# Patient Record
Sex: Female | Born: 1945
Health system: Southern US, Community
[De-identification: ages and names within clinical notes are randomized; demographics above are authoritative.]

## PROBLEM LIST (undated history)

## (undated) DIAGNOSIS — I10 Essential (primary) hypertension: Secondary | ICD-10-CM

## (undated) DIAGNOSIS — K219 Gastro-esophageal reflux disease without esophagitis: Secondary | ICD-10-CM

## (undated) DIAGNOSIS — E785 Hyperlipidemia, unspecified: Secondary | ICD-10-CM

## (undated) DIAGNOSIS — J189 Pneumonia, unspecified organism: Secondary | ICD-10-CM

## (undated) DIAGNOSIS — M1990 Unspecified osteoarthritis, unspecified site: Secondary | ICD-10-CM

## (undated) DIAGNOSIS — E119 Type 2 diabetes mellitus without complications: Secondary | ICD-10-CM

## (undated) DIAGNOSIS — L411 Pityriasis lichenoides chronica: Secondary | ICD-10-CM

## (undated) DIAGNOSIS — I82409 Acute embolism and thrombosis of unspecified deep veins of unspecified lower extremity: Secondary | ICD-10-CM

## (undated) DIAGNOSIS — F419 Anxiety disorder, unspecified: Secondary | ICD-10-CM

## (undated) DIAGNOSIS — G629 Polyneuropathy, unspecified: Secondary | ICD-10-CM

## (undated) DIAGNOSIS — D649 Anemia, unspecified: Secondary | ICD-10-CM

## (undated) DIAGNOSIS — G4730 Sleep apnea, unspecified: Secondary | ICD-10-CM

## (undated) DIAGNOSIS — F32A Depression, unspecified: Secondary | ICD-10-CM

## (undated) DIAGNOSIS — K52832 Lymphocytic colitis: Secondary | ICD-10-CM

## (undated) HISTORY — PX: HAMMER TOE SURGERY: SHX385

## (undated) HISTORY — PX: OTHER SURGICAL HISTORY: SHX169

## (undated) HISTORY — DX: Hyperlipidemia, unspecified: E78.5

## (undated) HISTORY — PX: DILATION AND CURETTAGE OF UTERUS: SHX78

---

## 2007-08-11 DIAGNOSIS — K52832 Lymphocytic colitis: Secondary | ICD-10-CM | POA: Insufficient documentation

## 2009-08-23 ENCOUNTER — Encounter: Admission: RE | Admit: 2009-08-23 | Discharge: 2009-11-05 | Payer: Self-pay | Admitting: Orthopaedic Surgery

## 2009-08-27 ENCOUNTER — Encounter: Admission: RE | Admit: 2009-08-27 | Discharge: 2009-11-25 | Payer: Self-pay | Admitting: Orthopedic Surgery

## 2010-08-10 DIAGNOSIS — I82409 Acute embolism and thrombosis of unspecified deep veins of unspecified lower extremity: Secondary | ICD-10-CM | POA: Insufficient documentation

## 2010-09-18 ENCOUNTER — Ambulatory Visit: Payer: Medicare Other | Attending: Orthopedic Surgery | Admitting: Physical Therapy

## 2010-09-18 DIAGNOSIS — M25673 Stiffness of unspecified ankle, not elsewhere classified: Secondary | ICD-10-CM | POA: Insufficient documentation

## 2010-09-18 DIAGNOSIS — M25579 Pain in unspecified ankle and joints of unspecified foot: Secondary | ICD-10-CM | POA: Insufficient documentation

## 2010-09-18 DIAGNOSIS — IMO0001 Reserved for inherently not codable concepts without codable children: Secondary | ICD-10-CM | POA: Insufficient documentation

## 2010-09-18 DIAGNOSIS — M25676 Stiffness of unspecified foot, not elsewhere classified: Secondary | ICD-10-CM | POA: Insufficient documentation

## 2011-08-16 ENCOUNTER — Ambulatory Visit (INDEPENDENT_AMBULATORY_CARE_PROVIDER_SITE_OTHER): Payer: Medicare Other

## 2011-08-16 DIAGNOSIS — H66009 Acute suppurative otitis media without spontaneous rupture of ear drum, unspecified ear: Secondary | ICD-10-CM | POA: Diagnosis not present

## 2011-08-21 DIAGNOSIS — M545 Low back pain, unspecified: Secondary | ICD-10-CM | POA: Diagnosis not present

## 2011-08-21 DIAGNOSIS — L408 Other psoriasis: Secondary | ICD-10-CM | POA: Diagnosis not present

## 2011-08-21 DIAGNOSIS — K5289 Other specified noninfective gastroenteritis and colitis: Secondary | ICD-10-CM | POA: Diagnosis not present

## 2011-08-21 DIAGNOSIS — M1990 Unspecified osteoarthritis, unspecified site: Secondary | ICD-10-CM | POA: Diagnosis not present

## 2011-08-21 DIAGNOSIS — L4050 Arthropathic psoriasis, unspecified: Secondary | ICD-10-CM | POA: Diagnosis not present

## 2011-08-21 DIAGNOSIS — Z79899 Other long term (current) drug therapy: Secondary | ICD-10-CM | POA: Diagnosis not present

## 2011-08-21 DIAGNOSIS — M109 Gout, unspecified: Secondary | ICD-10-CM | POA: Diagnosis not present

## 2011-08-21 DIAGNOSIS — M2550 Pain in unspecified joint: Secondary | ICD-10-CM | POA: Diagnosis not present

## 2011-09-11 DIAGNOSIS — H60399 Other infective otitis externa, unspecified ear: Secondary | ICD-10-CM | POA: Diagnosis not present

## 2011-09-11 DIAGNOSIS — H6500 Acute serous otitis media, unspecified ear: Secondary | ICD-10-CM | POA: Diagnosis not present

## 2011-09-11 DIAGNOSIS — H902 Conductive hearing loss, unspecified: Secondary | ICD-10-CM | POA: Diagnosis not present

## 2011-10-05 DIAGNOSIS — K5289 Other specified noninfective gastroenteritis and colitis: Secondary | ICD-10-CM | POA: Diagnosis not present

## 2011-10-05 DIAGNOSIS — M19079 Primary osteoarthritis, unspecified ankle and foot: Secondary | ICD-10-CM | POA: Diagnosis not present

## 2011-10-05 DIAGNOSIS — S92309A Fracture of unspecified metatarsal bone(s), unspecified foot, initial encounter for closed fracture: Secondary | ICD-10-CM | POA: Diagnosis not present

## 2011-10-05 DIAGNOSIS — Z1159 Encounter for screening for other viral diseases: Secondary | ICD-10-CM | POA: Diagnosis not present

## 2011-10-08 DIAGNOSIS — K5289 Other specified noninfective gastroenteritis and colitis: Secondary | ICD-10-CM | POA: Diagnosis not present

## 2011-10-08 DIAGNOSIS — Z79899 Other long term (current) drug therapy: Secondary | ICD-10-CM | POA: Diagnosis not present

## 2011-10-08 DIAGNOSIS — L4050 Arthropathic psoriasis, unspecified: Secondary | ICD-10-CM | POA: Diagnosis not present

## 2011-10-08 DIAGNOSIS — L408 Other psoriasis: Secondary | ICD-10-CM | POA: Diagnosis not present

## 2011-10-08 DIAGNOSIS — M109 Gout, unspecified: Secondary | ICD-10-CM | POA: Diagnosis not present

## 2011-10-08 DIAGNOSIS — M1990 Unspecified osteoarthritis, unspecified site: Secondary | ICD-10-CM | POA: Diagnosis not present

## 2011-10-22 DIAGNOSIS — B351 Tinea unguium: Secondary | ICD-10-CM | POA: Diagnosis not present

## 2011-10-22 DIAGNOSIS — M79609 Pain in unspecified limb: Secondary | ICD-10-CM | POA: Diagnosis not present

## 2011-11-02 DIAGNOSIS — S92309A Fracture of unspecified metatarsal bone(s), unspecified foot, initial encounter for closed fracture: Secondary | ICD-10-CM | POA: Diagnosis not present

## 2011-11-05 DIAGNOSIS — Z79899 Other long term (current) drug therapy: Secondary | ICD-10-CM | POA: Diagnosis not present

## 2011-11-05 DIAGNOSIS — L4050 Arthropathic psoriasis, unspecified: Secondary | ICD-10-CM | POA: Diagnosis not present

## 2011-12-10 DIAGNOSIS — M109 Gout, unspecified: Secondary | ICD-10-CM | POA: Diagnosis not present

## 2011-12-10 DIAGNOSIS — M1990 Unspecified osteoarthritis, unspecified site: Secondary | ICD-10-CM | POA: Diagnosis not present

## 2011-12-10 DIAGNOSIS — L4050 Arthropathic psoriasis, unspecified: Secondary | ICD-10-CM | POA: Diagnosis not present

## 2011-12-10 DIAGNOSIS — M2550 Pain in unspecified joint: Secondary | ICD-10-CM | POA: Diagnosis not present

## 2011-12-10 DIAGNOSIS — Z79899 Other long term (current) drug therapy: Secondary | ICD-10-CM | POA: Diagnosis not present

## 2011-12-10 DIAGNOSIS — L408 Other psoriasis: Secondary | ICD-10-CM | POA: Diagnosis not present

## 2011-12-10 DIAGNOSIS — K5289 Other specified noninfective gastroenteritis and colitis: Secondary | ICD-10-CM | POA: Diagnosis not present

## 2011-12-31 DIAGNOSIS — B351 Tinea unguium: Secondary | ICD-10-CM | POA: Diagnosis not present

## 2011-12-31 DIAGNOSIS — M79609 Pain in unspecified limb: Secondary | ICD-10-CM | POA: Diagnosis not present

## 2012-01-07 DIAGNOSIS — H5210 Myopia, unspecified eye: Secondary | ICD-10-CM | POA: Diagnosis not present

## 2012-01-07 DIAGNOSIS — H25019 Cortical age-related cataract, unspecified eye: Secondary | ICD-10-CM | POA: Diagnosis not present

## 2012-01-07 DIAGNOSIS — E119 Type 2 diabetes mellitus without complications: Secondary | ICD-10-CM | POA: Diagnosis not present

## 2012-01-07 DIAGNOSIS — H524 Presbyopia: Secondary | ICD-10-CM | POA: Diagnosis not present

## 2012-01-28 DIAGNOSIS — E669 Obesity, unspecified: Secondary | ICD-10-CM | POA: Diagnosis not present

## 2012-01-28 DIAGNOSIS — G4733 Obstructive sleep apnea (adult) (pediatric): Secondary | ICD-10-CM | POA: Diagnosis not present

## 2012-01-28 DIAGNOSIS — I10 Essential (primary) hypertension: Secondary | ICD-10-CM | POA: Diagnosis not present

## 2012-02-12 DIAGNOSIS — E119 Type 2 diabetes mellitus without complications: Secondary | ICD-10-CM | POA: Diagnosis not present

## 2012-02-12 DIAGNOSIS — M109 Gout, unspecified: Secondary | ICD-10-CM | POA: Diagnosis not present

## 2012-02-12 DIAGNOSIS — E559 Vitamin D deficiency, unspecified: Secondary | ICD-10-CM | POA: Diagnosis not present

## 2012-02-12 DIAGNOSIS — E785 Hyperlipidemia, unspecified: Secondary | ICD-10-CM | POA: Diagnosis not present

## 2012-02-12 DIAGNOSIS — I10 Essential (primary) hypertension: Secondary | ICD-10-CM | POA: Diagnosis not present

## 2012-02-17 DIAGNOSIS — F411 Generalized anxiety disorder: Secondary | ICD-10-CM | POA: Diagnosis not present

## 2012-02-17 DIAGNOSIS — E785 Hyperlipidemia, unspecified: Secondary | ICD-10-CM | POA: Diagnosis not present

## 2012-02-17 DIAGNOSIS — H6120 Impacted cerumen, unspecified ear: Secondary | ICD-10-CM | POA: Diagnosis not present

## 2012-02-17 DIAGNOSIS — I10 Essential (primary) hypertension: Secondary | ICD-10-CM | POA: Diagnosis not present

## 2012-02-17 DIAGNOSIS — E119 Type 2 diabetes mellitus without complications: Secondary | ICD-10-CM | POA: Diagnosis not present

## 2012-02-22 DIAGNOSIS — M1710 Unilateral primary osteoarthritis, unspecified knee: Secondary | ICD-10-CM | POA: Diagnosis not present

## 2012-02-24 DIAGNOSIS — L719 Rosacea, unspecified: Secondary | ICD-10-CM | POA: Diagnosis not present

## 2012-02-24 DIAGNOSIS — L408 Other psoriasis: Secondary | ICD-10-CM | POA: Diagnosis not present

## 2012-03-07 DIAGNOSIS — H608X9 Other otitis externa, unspecified ear: Secondary | ICD-10-CM | POA: Diagnosis not present

## 2012-03-07 DIAGNOSIS — H6120 Impacted cerumen, unspecified ear: Secondary | ICD-10-CM | POA: Diagnosis not present

## 2012-04-06 DIAGNOSIS — M1610 Unilateral primary osteoarthritis, unspecified hip: Secondary | ICD-10-CM | POA: Diagnosis not present

## 2012-04-06 DIAGNOSIS — M1710 Unilateral primary osteoarthritis, unspecified knee: Secondary | ICD-10-CM | POA: Diagnosis not present

## 2012-04-06 DIAGNOSIS — IMO0002 Reserved for concepts with insufficient information to code with codable children: Secondary | ICD-10-CM | POA: Diagnosis not present

## 2012-04-06 DIAGNOSIS — M169 Osteoarthritis of hip, unspecified: Secondary | ICD-10-CM | POA: Diagnosis not present

## 2012-04-19 DIAGNOSIS — M169 Osteoarthritis of hip, unspecified: Secondary | ICD-10-CM | POA: Diagnosis not present

## 2012-04-19 DIAGNOSIS — M1610 Unilateral primary osteoarthritis, unspecified hip: Secondary | ICD-10-CM | POA: Diagnosis not present

## 2012-04-20 DIAGNOSIS — K5289 Other specified noninfective gastroenteritis and colitis: Secondary | ICD-10-CM | POA: Diagnosis not present

## 2012-04-20 DIAGNOSIS — L4050 Arthropathic psoriasis, unspecified: Secondary | ICD-10-CM | POA: Diagnosis not present

## 2012-04-20 DIAGNOSIS — M109 Gout, unspecified: Secondary | ICD-10-CM | POA: Diagnosis not present

## 2012-04-20 DIAGNOSIS — M2550 Pain in unspecified joint: Secondary | ICD-10-CM | POA: Diagnosis not present

## 2012-04-20 DIAGNOSIS — L408 Other psoriasis: Secondary | ICD-10-CM | POA: Diagnosis not present

## 2012-04-20 DIAGNOSIS — M1990 Unspecified osteoarthritis, unspecified site: Secondary | ICD-10-CM | POA: Diagnosis not present

## 2012-04-20 DIAGNOSIS — Z79899 Other long term (current) drug therapy: Secondary | ICD-10-CM | POA: Diagnosis not present

## 2012-05-03 DIAGNOSIS — I4510 Unspecified right bundle-branch block: Secondary | ICD-10-CM | POA: Diagnosis not present

## 2012-05-05 DIAGNOSIS — IMO0002 Reserved for concepts with insufficient information to code with codable children: Secondary | ICD-10-CM | POA: Diagnosis not present

## 2012-05-05 DIAGNOSIS — M1710 Unilateral primary osteoarthritis, unspecified knee: Secondary | ICD-10-CM | POA: Diagnosis not present

## 2012-05-11 DIAGNOSIS — I10 Essential (primary) hypertension: Secondary | ICD-10-CM | POA: Diagnosis not present

## 2012-05-11 DIAGNOSIS — I4510 Unspecified right bundle-branch block: Secondary | ICD-10-CM | POA: Diagnosis not present

## 2012-06-06 DIAGNOSIS — Z23 Encounter for immunization: Secondary | ICD-10-CM | POA: Diagnosis not present

## 2012-06-06 DIAGNOSIS — I82409 Acute embolism and thrombosis of unspecified deep veins of unspecified lower extremity: Secondary | ICD-10-CM | POA: Diagnosis not present

## 2012-06-07 DIAGNOSIS — Z7901 Long term (current) use of anticoagulants: Secondary | ICD-10-CM | POA: Diagnosis not present

## 2012-06-07 DIAGNOSIS — I82409 Acute embolism and thrombosis of unspecified deep veins of unspecified lower extremity: Secondary | ICD-10-CM | POA: Diagnosis not present

## 2012-06-07 DIAGNOSIS — I10 Essential (primary) hypertension: Secondary | ICD-10-CM | POA: Diagnosis not present

## 2012-06-08 DIAGNOSIS — I10 Essential (primary) hypertension: Secondary | ICD-10-CM | POA: Diagnosis not present

## 2012-06-08 DIAGNOSIS — I82409 Acute embolism and thrombosis of unspecified deep veins of unspecified lower extremity: Secondary | ICD-10-CM | POA: Diagnosis not present

## 2012-06-08 DIAGNOSIS — I059 Rheumatic mitral valve disease, unspecified: Secondary | ICD-10-CM | POA: Diagnosis not present

## 2012-06-21 DIAGNOSIS — I82409 Acute embolism and thrombosis of unspecified deep veins of unspecified lower extremity: Secondary | ICD-10-CM | POA: Diagnosis not present

## 2012-06-21 DIAGNOSIS — Z7901 Long term (current) use of anticoagulants: Secondary | ICD-10-CM | POA: Diagnosis not present

## 2012-06-21 DIAGNOSIS — I10 Essential (primary) hypertension: Secondary | ICD-10-CM | POA: Diagnosis not present

## 2012-06-21 DIAGNOSIS — L4050 Arthropathic psoriasis, unspecified: Secondary | ICD-10-CM | POA: Diagnosis not present

## 2012-07-21 DIAGNOSIS — M169 Osteoarthritis of hip, unspecified: Secondary | ICD-10-CM | POA: Diagnosis not present

## 2012-07-21 DIAGNOSIS — L408 Other psoriasis: Secondary | ICD-10-CM | POA: Diagnosis not present

## 2012-07-21 DIAGNOSIS — L821 Other seborrheic keratosis: Secondary | ICD-10-CM | POA: Diagnosis not present

## 2012-07-21 DIAGNOSIS — L210 Seborrhea capitis: Secondary | ICD-10-CM | POA: Diagnosis not present

## 2012-07-21 DIAGNOSIS — M1610 Unilateral primary osteoarthritis, unspecified hip: Secondary | ICD-10-CM | POA: Diagnosis not present

## 2012-07-21 DIAGNOSIS — L819 Disorder of pigmentation, unspecified: Secondary | ICD-10-CM | POA: Diagnosis not present

## 2012-07-21 DIAGNOSIS — Z01818 Encounter for other preprocedural examination: Secondary | ICD-10-CM | POA: Diagnosis not present

## 2012-07-21 DIAGNOSIS — D1801 Hemangioma of skin and subcutaneous tissue: Secondary | ICD-10-CM | POA: Diagnosis not present

## 2012-07-21 DIAGNOSIS — L708 Other acne: Secondary | ICD-10-CM | POA: Diagnosis not present

## 2012-07-21 DIAGNOSIS — D239 Other benign neoplasm of skin, unspecified: Secondary | ICD-10-CM | POA: Diagnosis not present

## 2012-07-25 DIAGNOSIS — M1990 Unspecified osteoarthritis, unspecified site: Secondary | ICD-10-CM | POA: Diagnosis not present

## 2012-07-25 DIAGNOSIS — M109 Gout, unspecified: Secondary | ICD-10-CM | POA: Diagnosis not present

## 2012-07-25 DIAGNOSIS — L4050 Arthropathic psoriasis, unspecified: Secondary | ICD-10-CM | POA: Diagnosis not present

## 2012-07-25 DIAGNOSIS — K5289 Other specified noninfective gastroenteritis and colitis: Secondary | ICD-10-CM | POA: Diagnosis not present

## 2012-07-25 DIAGNOSIS — I82409 Acute embolism and thrombosis of unspecified deep veins of unspecified lower extremity: Secondary | ICD-10-CM | POA: Diagnosis not present

## 2012-07-25 DIAGNOSIS — Z79899 Other long term (current) drug therapy: Secondary | ICD-10-CM | POA: Diagnosis not present

## 2012-07-25 DIAGNOSIS — M2550 Pain in unspecified joint: Secondary | ICD-10-CM | POA: Diagnosis not present

## 2012-07-25 DIAGNOSIS — L408 Other psoriasis: Secondary | ICD-10-CM | POA: Diagnosis not present

## 2012-08-08 DIAGNOSIS — I119 Hypertensive heart disease without heart failure: Secondary | ICD-10-CM | POA: Diagnosis not present

## 2012-08-08 DIAGNOSIS — I82409 Acute embolism and thrombosis of unspecified deep veins of unspecified lower extremity: Secondary | ICD-10-CM | POA: Diagnosis not present

## 2012-08-08 DIAGNOSIS — G4733 Obstructive sleep apnea (adult) (pediatric): Secondary | ICD-10-CM | POA: Diagnosis not present

## 2012-08-10 DIAGNOSIS — R7302 Impaired glucose tolerance (oral): Secondary | ICD-10-CM | POA: Insufficient documentation

## 2012-08-15 DIAGNOSIS — Z7901 Long term (current) use of anticoagulants: Secondary | ICD-10-CM | POA: Diagnosis not present

## 2012-08-15 DIAGNOSIS — E785 Hyperlipidemia, unspecified: Secondary | ICD-10-CM | POA: Diagnosis not present

## 2012-08-15 DIAGNOSIS — E559 Vitamin D deficiency, unspecified: Secondary | ICD-10-CM | POA: Diagnosis not present

## 2012-08-15 DIAGNOSIS — I10 Essential (primary) hypertension: Secondary | ICD-10-CM | POA: Diagnosis not present

## 2012-08-15 DIAGNOSIS — E119 Type 2 diabetes mellitus without complications: Secondary | ICD-10-CM | POA: Diagnosis not present

## 2012-08-15 DIAGNOSIS — I82409 Acute embolism and thrombosis of unspecified deep veins of unspecified lower extremity: Secondary | ICD-10-CM | POA: Diagnosis not present

## 2012-08-15 DIAGNOSIS — M109 Gout, unspecified: Secondary | ICD-10-CM | POA: Diagnosis not present

## 2012-08-22 DIAGNOSIS — E785 Hyperlipidemia, unspecified: Secondary | ICD-10-CM | POA: Diagnosis not present

## 2012-08-22 DIAGNOSIS — M109 Gout, unspecified: Secondary | ICD-10-CM | POA: Diagnosis not present

## 2012-08-22 DIAGNOSIS — E119 Type 2 diabetes mellitus without complications: Secondary | ICD-10-CM | POA: Diagnosis not present

## 2012-08-22 DIAGNOSIS — I10 Essential (primary) hypertension: Secondary | ICD-10-CM | POA: Diagnosis not present

## 2012-08-22 DIAGNOSIS — I82409 Acute embolism and thrombosis of unspecified deep veins of unspecified lower extremity: Secondary | ICD-10-CM | POA: Diagnosis not present

## 2012-09-05 DIAGNOSIS — M79609 Pain in unspecified limb: Secondary | ICD-10-CM | POA: Diagnosis not present

## 2012-09-05 DIAGNOSIS — B351 Tinea unguium: Secondary | ICD-10-CM | POA: Diagnosis not present

## 2012-09-05 NOTE — Patient Instructions (Addendum)
[name] [name]  09/05/2012   Your procedure is scheduled on:  09/13/12   Report to [facility] at 0800 AM.  Call this number if you have problems the morning of surgery: [phone]   Remember:   Do not eat food or drink liquids after midnight.   Take these medicines the morning of surgery with A SIP OF WATER:    Do not wear jewelry, make-up or nail polish.  Do not wear lotions, powders, or perfumes. .  Do not shave 48 hours prior to surgery.   Do not bring valuables to the hospital.  Contacts, dentures or bridgework may not be worn into surgery.  Leave suitcase in the car. After surgery it may be brought to your room.  For patients admitted to the hospital, checkout time is 11:00 AM the day of  discharge.              SEE CHG INSTRUCTION SHEET     Please read over the following fact sheets that you were given: MRSA Information, Blood Transfusion Fact Sheet, coughing and deep breathing exercises, leg exercises, Incentive Spirometry Fact sheet.               Failure to comply with these instructions may result in cancellation of your surgery.               Patient Signature ______________________________             Nurse Signature ______________________________

## 2012-09-06 ENCOUNTER — Ambulatory Visit (HOSPITAL_COMMUNITY)
Admission: RE | Admit: 2012-09-06 | Discharge: 2012-09-06 | Disposition: A | Discharge status: Home / Self Care | Payer: Medicare Other | Source: Ambulatory Visit | Attending: Orthopedic Surgery | Admitting: Orthopedic Surgery

## 2012-09-06 ENCOUNTER — Encounter (HOSPITAL_COMMUNITY): Payer: Self-pay

## 2012-09-06 ENCOUNTER — Encounter (HOSPITAL_COMMUNITY): Payer: Self-pay | Admitting: Pharmacy Technician

## 2012-09-06 ENCOUNTER — Encounter (HOSPITAL_COMMUNITY)
Admission: RE | Admit: 2012-09-06 | Discharge: 2012-09-06 | Disposition: A | Discharge status: Home / Self Care | Payer: Medicare Other | Source: Ambulatory Visit | Attending: Orthopedic Surgery | Admitting: Orthopedic Surgery

## 2012-09-06 DIAGNOSIS — Z01818 Encounter for other preprocedural examination: Secondary | ICD-10-CM | POA: Diagnosis not present

## 2012-09-06 DIAGNOSIS — Z01812 Encounter for preprocedural laboratory examination: Secondary | ICD-10-CM | POA: Insufficient documentation

## 2012-09-06 DIAGNOSIS — I10 Essential (primary) hypertension: Secondary | ICD-10-CM | POA: Diagnosis not present

## 2012-09-06 HISTORY — DX: Essential (primary) hypertension: I10

## 2012-09-06 HISTORY — DX: Type 2 diabetes mellitus without complications: E11.9

## 2012-09-06 HISTORY — DX: Acute embolism and thrombosis of unspecified deep veins of unspecified lower extremity: I82.409

## 2012-09-06 HISTORY — DX: Polyneuropathy, unspecified: G62.9

## 2012-09-06 HISTORY — DX: Gastro-esophageal reflux disease without esophagitis: K21.9

## 2012-09-06 HISTORY — DX: Lymphocytic colitis: K52.832

## 2012-09-06 HISTORY — DX: Pityriasis lichenoides chronica: L41.1

## 2012-09-06 HISTORY — DX: Sleep apnea, unspecified: G47.30

## 2012-09-06 HISTORY — DX: Unspecified osteoarthritis, unspecified site: M19.90

## 2012-09-06 LAB — URINALYSIS, ROUTINE W REFLEX MICROSCOPIC
Bilirubin Urine: NEGATIVE
Glucose, UA: NEGATIVE mg/dL
Hgb urine dipstick: NEGATIVE
Ketones, ur: NEGATIVE mg/dL
Nitrite: NEGATIVE
Protein, ur: NEGATIVE mg/dL
Specific Gravity, Urine: 1.011 (ref 1.005–1.030)
Urobilinogen, UA: 1 mg/dL (ref 0.0–1.0)
pH: 7.5 (ref 5.0–8.0)

## 2012-09-06 LAB — URINE MICROSCOPIC-ADD ON

## 2012-09-06 LAB — SURGICAL PCR SCREEN
MRSA, PCR: INVALID — AB
Staphylococcus aureus: INVALID — AB

## 2012-09-06 LAB — BASIC METABOLIC PANEL
BUN: 16 mg/dL (ref 6–23)
CO2: 28 mEq/L (ref 19–32)
Calcium: 9.8 mg/dL (ref 8.4–10.5)
Chloride: 97 mEq/L (ref 96–112)
Creatinine, Ser: 0.77 mg/dL (ref 0.50–1.10)
GFR calc Af Amer: >90 mL/min (ref >90)
GFR calc non Af Amer: 85 mL/min — ABNORMAL LOW (ref >90)
Glucose, Bld: 108 mg/dL — ABNORMAL HIGH (ref 70–99)
Potassium: 4.2 mEq/L (ref 3.5–5.1)
Sodium: 135 mEq/L (ref 135–145)

## 2012-09-06 LAB — APTT: aPTT: 43 seconds — ABNORMAL HIGH (ref 24–37)

## 2012-09-06 LAB — CBC
HCT: 41.5 % (ref 36.0–46.0)
Hemoglobin: 14 g/dL (ref 12.0–15.0)
MCH: 31.8 pg (ref 26.0–34.0)
MCHC: 33.7 g/dL (ref 30.0–36.0)
MCV: 94.3 fL (ref 78.0–100.0)
Platelets: 229 10*3/uL (ref 150–400)
RBC: 4.4 MIL/uL (ref 3.87–5.11)
RDW: 13.9 % (ref 11.5–15.5)
WBC: 7 10*3/uL (ref 4.0–10.5)

## 2012-09-06 LAB — PROTIME-INR
INR: 1.94 — ABNORMAL HIGH (ref 0.00–1.49)
Prothrombin Time: 21.4 seconds — ABNORMAL HIGH (ref 11.6–15.2)

## 2012-09-06 NOTE — Progress Notes (Signed)
EKG 07/21/12 on chart  EKG 06/08/12 on chart  Office vist with Dr Nicki Guadalajara 08/08/12 on chart  ECHO 05/11/12 on chart  Office visit note - PCp- 08/22/12 on chart

## 2012-09-06 NOTE — Progress Notes (Signed)
Left patient a message regarding pcr swab sent out and results will not be back until Friday 09/09/12.  i informed patient I would let her know on Friday am of pcr swab results.  Also informed patient that I had questions regarding medications and asked if she could please call back at [phone].

## 2012-09-06 NOTE — Progress Notes (Signed)
PT, PTT, and Urinalysis with micro results faxed via EPIC to Dr Charlann Boxer.

## 2012-09-07 LAB — URINE CULTURE: Colony Count: 35000

## 2012-09-07 NOTE — Progress Notes (Signed)
Patient was made aware to bring CPAP mask and tubing am of surgery.  Patient voiced understanding.  Patient has been given instructions for Xarelto from [facility] for surgery per patient.  Patient is aware nurse will call on Friday am with pcr nasal swab results.

## 2012-09-08 LAB — MRSA CULTURE

## 2012-09-08 NOTE — Progress Notes (Signed)
Left patient a message to let her know that nasal swab results were negative for both MRSA and staph.  Instructed patient to call back so that nurse is aware she received message.

## 2012-09-11 NOTE — H&P (Signed)
TOTAL HIP ADMISSION H&P  Patient is admitted for right total hip arthroplasty, anterior approach.  Subjective:  Chief Complaint:  Right hip OA / pain  HPI: [name] [name], 67 y.o. female, has a history of pain and functional disability in the right hip(s) due to arthritis and patient has failed non-surgical conservative treatments for greater than 12 weeks to include NSAID's and/or analgesics, corticosteriod injections and activity modification.  Onset of symptoms was gradual starting 3 years ago with gradually worsening course since that time.The patient noted no past surgery on the right hip(s).  Patient currently rates pain in the right hip at 7 out of 10 with activity. Patient has night pain, worsening of pain with activity and weight bearing, trendelenberg gait, pain that interfers with activities of daily living and pain with passive range of motion. Patient has evidence of periarticular osteophytes and joint space narrowing by imaging studies. This condition presents safety issues increasing the risk of falls.  There is no current active infection.  Risks, benefits and expectations were discussed with the patient. Patient understand the risks, benefits and expectations and wishes to proceed with surgery.   D/C Plans:   Home with HHPT  Post-op Meds: No Rx given  Tranexamic Acid:   Not to be given - Hx of blood clots  Decadron:    Not to be given - DM  FYI:   Already on Xarelto and will continue after surgery  Past Medical History  Diagnosis Date  . Hypertension   . Sleep apnea     bipap  . Diabetes mellitus without complication     diet controlled   . Deep vein thrombosis     right calf - 05/2012   . GERD (gastroesophageal reflux disease)   . Arthritis   . Neuropathy     diabetic - in bilateral feet   . Pityriasis lichenoides chronica   . Lymphocytic colitis     Past Surgical History  Procedure Date  . Dilation and curettage of uterus   . Torn meniscus repair      right  knee   . Hammer toe surgery   . Right hand surgery      due to blood infection      Allergies  Allergen Reactions  . Fish Allergy Other (See Comments)    Blue fish - hands and feet turn red, breathing decreases  . Sulfa Antibiotics Hives    History  Substance Use Topics  . Smoking status: Never Smoker   . Smokeless tobacco: Never Used  . Alcohol Use: Yes     Comment: occasional wine        Review of Systems  Constitutional: Negative.   HENT: Negative.   Eyes: Negative.   Respiratory: Negative.   Cardiovascular: Negative.   Gastrointestinal: Negative.   Genitourinary: Negative.   Musculoskeletal: Positive for joint pain.  Skin: Negative.   Neurological: Negative.   Endo/Heme/Allergies: Negative.   Psychiatric/Behavioral: Negative.     Objective:  Physical Exam  Constitutional: She is oriented to person, place, and time. She appears well-developed and well-nourished.  HENT:  Head: Normocephalic and atraumatic.  Mouth/Throat: Oropharynx is clear and moist.  Eyes: Pupils are equal, round, and reactive to light.  Neck: Neck supple. No JVD present. No tracheal deviation present. No thyromegaly present.  Cardiovascular: Normal rate, regular rhythm, normal heart sounds and intact distal pulses.   Respiratory: Effort normal and breath sounds normal. No stridor. No respiratory distress. She has no wheezes.  GI:  Soft. There is no tenderness. There is no guarding.  Lymphadenopathy:    She has no cervical adenopathy.  Neurological: She is alert and oriented to person, place, and time. A sensory deficit (in the feet (known neuropathy)) is present.  Skin: Skin is warm and dry.  Psychiatric: She has a normal mood and affect.    Imaging Review Plain radiographs demonstrate severe degenerative joint disease of the right hip(s). The bone quality appears to be good for age and reported activity level.  Assessment/Plan:  End stage arthritis, right hip(s)  The patient history,  physical examination, clinical judgement of the provider and imaging studies are consistent with end stage degenerative joint disease of the right hip(s) and total hip arthroplasty is deemed medically necessary. The treatment options including medical management, injection therapy, arthroscopy and arthroplasty were discussed at length. The risks and benefits of total hip arthroplasty were presented and reviewed. The risks due to aseptic loosening, infection, stiffness, dislocation/subluxation,  thromboembolic complications and other imponderables were discussed.  The patient acknowledged the explanation, agreed to proceed with the plan and consent was signed. Patient is being admitted for inpatient treatment for surgery, pain control, PT, OT, prophylactic antibiotics, VTE prophylaxis, progressive ambulation and ADL's and discharge planning.The patient is planning to be discharged home with home health services.    Anastasio Auerbach [name] [name]   PAC  09/11/2012, 7:56 PM

## 2012-09-13 ENCOUNTER — Inpatient Hospital Stay (HOSPITAL_COMMUNITY): Payer: Medicare Other

## 2012-09-13 ENCOUNTER — Inpatient Hospital Stay (HOSPITAL_COMMUNITY): Payer: Medicare Other | Admitting: Anesthesiology

## 2012-09-13 ENCOUNTER — Encounter (HOSPITAL_COMMUNITY)
Admission: RE | Disposition: A | Discharge status: Home Health | Payer: Self-pay | Source: Ambulatory Visit | Attending: Orthopedic Surgery

## 2012-09-13 ENCOUNTER — Encounter (HOSPITAL_COMMUNITY): Payer: Self-pay | Admitting: *Deleted

## 2012-09-13 ENCOUNTER — Encounter (HOSPITAL_COMMUNITY): Payer: Self-pay | Admitting: Anesthesiology

## 2012-09-13 ENCOUNTER — Inpatient Hospital Stay (HOSPITAL_COMMUNITY)
Admission: RE | Admit: 2012-09-13 | Discharge: 2012-09-14 | DRG: 470 | Disposition: A | Discharge status: Home Health | Payer: Medicare Other | Source: Ambulatory Visit | Attending: Orthopedic Surgery | Admitting: Orthopedic Surgery

## 2012-09-13 DIAGNOSIS — Z86718 Personal history of other venous thrombosis and embolism: Secondary | ICD-10-CM

## 2012-09-13 DIAGNOSIS — E871 Hypo-osmolality and hyponatremia: Secondary | ICD-10-CM | POA: Diagnosis not present

## 2012-09-13 DIAGNOSIS — D500 Iron deficiency anemia secondary to blood loss (chronic): Secondary | ICD-10-CM | POA: Diagnosis not present

## 2012-09-13 DIAGNOSIS — E1142 Type 2 diabetes mellitus with diabetic polyneuropathy: Secondary | ICD-10-CM | POA: Diagnosis present

## 2012-09-13 DIAGNOSIS — M1610 Unilateral primary osteoarthritis, unspecified hip: Secondary | ICD-10-CM | POA: Diagnosis not present

## 2012-09-13 DIAGNOSIS — D62 Acute posthemorrhagic anemia: Secondary | ICD-10-CM | POA: Diagnosis not present

## 2012-09-13 DIAGNOSIS — I10 Essential (primary) hypertension: Secondary | ICD-10-CM | POA: Diagnosis not present

## 2012-09-13 DIAGNOSIS — Z471 Aftercare following joint replacement surgery: Secondary | ICD-10-CM | POA: Diagnosis not present

## 2012-09-13 DIAGNOSIS — Z96649 Presence of unspecified artificial hip joint: Secondary | ICD-10-CM

## 2012-09-13 DIAGNOSIS — M169 Osteoarthritis of hip, unspecified: Secondary | ICD-10-CM | POA: Diagnosis not present

## 2012-09-13 DIAGNOSIS — Z6838 Body mass index (BMI) 38.0-38.9, adult: Secondary | ICD-10-CM

## 2012-09-13 DIAGNOSIS — E1149 Type 2 diabetes mellitus with other diabetic neurological complication: Secondary | ICD-10-CM | POA: Diagnosis present

## 2012-09-13 DIAGNOSIS — G4730 Sleep apnea, unspecified: Secondary | ICD-10-CM | POA: Diagnosis present

## 2012-09-13 DIAGNOSIS — E669 Obesity, unspecified: Secondary | ICD-10-CM | POA: Diagnosis present

## 2012-09-13 DIAGNOSIS — L419 Parapsoriasis, unspecified: Secondary | ICD-10-CM | POA: Diagnosis present

## 2012-09-13 DIAGNOSIS — G4710 Hypersomnia, unspecified: Secondary | ICD-10-CM | POA: Diagnosis not present

## 2012-09-13 DIAGNOSIS — K219 Gastro-esophageal reflux disease without esophagitis: Secondary | ICD-10-CM | POA: Diagnosis not present

## 2012-09-13 HISTORY — PX: TOTAL HIP ARTHROPLASTY: SHX124

## 2012-09-13 LAB — GLUCOSE, CAPILLARY
Glucose-Capillary: 110 mg/dL — ABNORMAL HIGH (ref 70–99)
Glucose-Capillary: 136 mg/dL — ABNORMAL HIGH (ref 70–99)

## 2012-09-13 LAB — TYPE AND SCREEN
ABO/RH(D): O POS
Antibody Screen: NEGATIVE

## 2012-09-13 LAB — PROTIME-INR
INR: 1.14 (ref 0.00–1.49)
Prothrombin Time: 14.4 seconds (ref 11.6–15.2)

## 2012-09-13 LAB — ABO/RH: ABO/RH(D): O POS

## 2012-09-13 SURGERY — ARTHROPLASTY, HIP, TOTAL, ANTERIOR APPROACH
Anesthesia: General | Site: Hip | Laterality: Right | Wound class: Clean

## 2012-09-13 MED ORDER — LACTATED RINGERS IV SOLN: INTRAVENOUS | Status: DC

## 2012-09-13 MED ORDER — POLYETHYLENE GLYCOL 3350 17 G PO PACK: 17.0000 g | PACK | Freq: Every day | ORAL | Status: DC | PRN

## 2012-09-13 MED ORDER — METHOCARBAMOL 500 MG PO TABS
500.0000 mg | ORAL_TABLET | Freq: Four times a day (QID) | ORAL | Status: DC | PRN
  Administered 2012-09-13 – 2012-09-14 (×2): 500 mg via ORAL
  Filled 2012-09-13 (×2): qty 1

## 2012-09-13 MED ORDER — SENNA 8.6 MG PO TABS
1.0000 | ORAL_TABLET | Freq: Two times a day (BID) | ORAL | Status: DC
  Administered 2012-09-13 – 2012-09-14 (×2): 8.6 mg via ORAL
  Filled 2012-09-13 (×2): qty 1

## 2012-09-13 MED ORDER — ONDANSETRON HCL 4 MG PO TABS: 4.0000 mg | ORAL_TABLET | Freq: Four times a day (QID) | ORAL | Status: DC | PRN

## 2012-09-13 MED ORDER — DIPHENHYDRAMINE HCL 12.5 MG/5ML PO ELIX: 25.0000 mg | ORAL_SOLUTION | Freq: Four times a day (QID) | ORAL | Status: DC | PRN

## 2012-09-13 MED ORDER — GLYCOPYRROLATE 0.2 MG/ML IJ SOLN
INTRAMUSCULAR | Status: DC | PRN
  Administered 2012-09-13: .8 mg via INTRAVENOUS

## 2012-09-13 MED ORDER — LACTATED RINGERS IV SOLN
INTRAVENOUS | Status: DC
  Administered 2012-09-13 (×2): via INTRAVENOUS
  Administered 2012-09-13: 1000 mL via INTRAVENOUS

## 2012-09-13 MED ORDER — HYDROCODONE-ACETAMINOPHEN 7.5-325 MG PO TABS
1.0000 | ORAL_TABLET | ORAL | Status: DC
  Administered 2012-09-13 (×2): 2 via ORAL
  Administered 2012-09-13 (×2): 1 via ORAL
  Administered 2012-09-14 (×3): 2 via ORAL
  Filled 2012-09-13 (×4): qty 2
  Filled 2012-09-13: qty 1
  Filled 2012-09-13 (×2): qty 2

## 2012-09-13 MED ORDER — SODIUM CHLORIDE 0.9 % IV SOLN
INTRAVENOUS | Status: DC
  Administered 2012-09-13 – 2012-09-14 (×2): via INTRAVENOUS
  Filled 2012-09-13 (×5): qty 1000

## 2012-09-13 MED ORDER — LISINOPRIL 20 MG PO TABS
20.0000 mg | ORAL_TABLET | Freq: Every day | ORAL | Status: DC
  Administered 2012-09-14: 20 mg via ORAL
  Filled 2012-09-13 (×2): qty 1

## 2012-09-13 MED ORDER — STERILE WATER FOR IRRIGATION IR SOLN
Status: DC | PRN
  Administered 2012-09-13: 3000 mL

## 2012-09-13 MED ORDER — ACETAMINOPHEN 10 MG/ML IV SOLN
1000.0000 mg | Freq: Once | INTRAVENOUS | Status: AC
  Administered 2012-09-13: 1000 mg via INTRAVENOUS

## 2012-09-13 MED ORDER — SUCCINYLCHOLINE CHLORIDE 20 MG/ML IJ SOLN
INTRAMUSCULAR | Status: DC | PRN
  Administered 2012-09-13: 100 mg via INTRAVENOUS

## 2012-09-13 MED ORDER — ALLOPURINOL 100 MG PO TABS
100.0000 mg | ORAL_TABLET | Freq: Two times a day (BID) | ORAL | Status: DC
  Administered 2012-09-13 – 2012-09-14 (×2): 100 mg via ORAL
  Filled 2012-09-13 (×3): qty 1

## 2012-09-13 MED ORDER — LISINOPRIL-HYDROCHLOROTHIAZIDE 20-12.5 MG PO TABS: 1.0000 | ORAL_TABLET | Freq: Every morning | ORAL | Status: DC

## 2012-09-13 MED ORDER — ALUM & MAG HYDROXIDE-SIMETH 200-200-20 MG/5ML PO SUSP: 30.0000 mL | ORAL | Status: DC | PRN

## 2012-09-13 MED ORDER — 0.9 % SODIUM CHLORIDE (POUR BTL) OPTIME
TOPICAL | Status: DC | PRN
  Administered 2012-09-13: 1000 mL

## 2012-09-13 MED ORDER — MIDAZOLAM HCL 5 MG/5ML IJ SOLN
INTRAMUSCULAR | Status: DC | PRN
  Administered 2012-09-13: 2 mg via INTRAVENOUS

## 2012-09-13 MED ORDER — HYDROCHLOROTHIAZIDE 12.5 MG PO CAPS
12.5000 mg | ORAL_CAPSULE | Freq: Every day | ORAL | Status: DC
  Administered 2012-09-14: 12.5 mg via ORAL
  Filled 2012-09-13 (×2): qty 1

## 2012-09-13 MED ORDER — MENTHOL 3 MG MT LOZG
1.0000 | LOZENGE | OROMUCOSAL | Status: DC | PRN
  Filled 2012-09-13: qty 9

## 2012-09-13 MED ORDER — ONDANSETRON HCL 4 MG/2ML IJ SOLN: 4.0000 mg | Freq: Four times a day (QID) | INTRAMUSCULAR | Status: DC | PRN

## 2012-09-13 MED ORDER — VITAMIN D 1000 UNITS PO TABS
1000.0000 [IU] | ORAL_TABLET | Freq: Every morning | ORAL | Status: DC
  Administered 2012-09-14: 1000 [IU] via ORAL
  Filled 2012-09-13: qty 1

## 2012-09-13 MED ORDER — HYDROMORPHONE HCL PF 1 MG/ML IJ SOLN
0.2500 mg | INTRAMUSCULAR | Status: DC | PRN
  Administered 2012-09-13 (×3): 0.5 mg via INTRAVENOUS
  Administered 2012-09-13 (×2): 0.25 mg via INTRAVENOUS

## 2012-09-13 MED ORDER — LIDOCAINE HCL (CARDIAC) 20 MG/ML IV SOLN
INTRAVENOUS | Status: DC | PRN
  Administered 2012-09-13: 100 mg via INTRAVENOUS

## 2012-09-13 MED ORDER — KETOROLAC TROMETHAMINE 30 MG/ML IJ SOLN
15.0000 mg | Freq: Once | INTRAMUSCULAR | Status: AC | PRN
  Administered 2012-09-13: 30 mg via INTRAVENOUS

## 2012-09-13 MED ORDER — PHENOL 1.4 % MT LIQD
1.0000 | OROMUCOSAL | Status: DC | PRN
  Filled 2012-09-13: qty 177

## 2012-09-13 MED ORDER — HYDROMORPHONE HCL PF 1 MG/ML IJ SOLN: 0.2000 mg | INTRAMUSCULAR | Status: DC | PRN

## 2012-09-13 MED ORDER — CEFAZOLIN SODIUM-DEXTROSE 2-3 GM-% IV SOLR
2.0000 g | INTRAVENOUS | Status: AC
  Administered 2012-09-13: 2 g via INTRAVENOUS

## 2012-09-13 MED ORDER — CITALOPRAM HYDROBROMIDE 20 MG PO TABS
20.0000 mg | ORAL_TABLET | Freq: Every day | ORAL | Status: DC
  Administered 2012-09-13: 20 mg via ORAL
  Filled 2012-09-13 (×2): qty 1

## 2012-09-13 MED ORDER — DEXAMETHASONE SODIUM PHOSPHATE 10 MG/ML IJ SOLN
INTRAMUSCULAR | Status: DC | PRN
  Administered 2012-09-13: 10 mg via INTRAVENOUS

## 2012-09-13 MED ORDER — ONDANSETRON HCL 4 MG/2ML IJ SOLN
INTRAMUSCULAR | Status: DC | PRN
  Administered 2012-09-13: 4 mg via INTRAVENOUS

## 2012-09-13 MED ORDER — CEFAZOLIN SODIUM-DEXTROSE 2-3 GM-% IV SOLR
2.0000 g | Freq: Four times a day (QID) | INTRAVENOUS | Status: AC
  Administered 2012-09-13 (×2): 2 g via INTRAVENOUS
  Filled 2012-09-13 (×2): qty 50

## 2012-09-13 MED ORDER — MOMETASONE FUROATE 0.1 % EX CREA
1.0000 "application " | TOPICAL_CREAM | Freq: Every day | CUTANEOUS | Status: DC | PRN
  Filled 2012-09-13: qty 15

## 2012-09-13 MED ORDER — GABAPENTIN 300 MG PO CAPS
600.0000 mg | ORAL_CAPSULE | Freq: Three times a day (TID) | ORAL | Status: DC
  Administered 2012-09-13 – 2012-09-14 (×3): 600 mg via ORAL
  Filled 2012-09-13 (×5): qty 2

## 2012-09-13 MED ORDER — RIVAROXABAN 20 MG PO TABS
20.0000 mg | ORAL_TABLET | Freq: Every day | ORAL | Status: DC
Start: 2012-09-14 — End: 2012-09-14
  Administered 2012-09-14: 20 mg via ORAL
  Filled 2012-09-13: qty 1

## 2012-09-13 MED ORDER — PROPOFOL 10 MG/ML IV BOLUS
INTRAVENOUS | Status: DC | PRN
  Administered 2012-09-13: 170 mg via INTRAVENOUS

## 2012-09-13 MED ORDER — DOCUSATE SODIUM 100 MG PO CAPS
100.0000 mg | ORAL_CAPSULE | Freq: Two times a day (BID) | ORAL | Status: DC
  Administered 2012-09-13 – 2012-09-14 (×2): 100 mg via ORAL

## 2012-09-13 MED ORDER — CHLORHEXIDINE GLUCONATE 4 % EX LIQD
60.0000 mL | Freq: Once | CUTANEOUS | Status: DC
  Filled 2012-09-13: qty 60

## 2012-09-13 MED ORDER — PANTOPRAZOLE SODIUM 40 MG PO TBEC
80.0000 mg | DELAYED_RELEASE_TABLET | Freq: Every day | ORAL | Status: DC
  Administered 2012-09-13 – 2012-09-14 (×2): 80 mg via ORAL
  Filled 2012-09-13 (×2): qty 2

## 2012-09-13 MED ORDER — ZOLPIDEM TARTRATE 5 MG PO TABS: 5.0000 mg | ORAL_TABLET | Freq: Every evening | ORAL | Status: DC | PRN

## 2012-09-13 MED ORDER — NEOSTIGMINE METHYLSULFATE 1 MG/ML IJ SOLN
INTRAMUSCULAR | Status: DC | PRN
  Administered 2012-09-13: 4 mg via INTRAVENOUS

## 2012-09-13 MED ORDER — LOPERAMIDE HCL 2 MG PO CAPS
6.0000 mg | ORAL_CAPSULE | Freq: Two times a day (BID) | ORAL | Status: DC
  Administered 2012-09-13 – 2012-09-14 (×2): 6 mg via ORAL
  Filled 2012-09-13 (×3): qty 3

## 2012-09-13 MED ORDER — METHOCARBAMOL 100 MG/ML IJ SOLN
500.0000 mg | Freq: Four times a day (QID) | INTRAMUSCULAR | Status: DC | PRN
  Administered 2012-09-13: 500 mg via INTRAVENOUS
  Filled 2012-09-13: qty 5

## 2012-09-13 MED ORDER — FENTANYL CITRATE 0.05 MG/ML IJ SOLN
INTRAMUSCULAR | Status: DC | PRN
  Administered 2012-09-13 (×5): 50 ug via INTRAVENOUS

## 2012-09-13 MED ORDER — ROCURONIUM BROMIDE 100 MG/10ML IV SOLN
INTRAVENOUS | Status: DC | PRN
  Administered 2012-09-13: 40 mg via INTRAVENOUS
  Administered 2012-09-13: 5 mg via INTRAVENOUS

## 2012-09-13 MED ORDER — HYDROMORPHONE HCL PF 1 MG/ML IJ SOLN: 0.2500 mg | INTRAMUSCULAR | Status: DC | PRN

## 2012-09-13 MED ORDER — FERROUS SULFATE 325 (65 FE) MG PO TABS
325.0000 mg | ORAL_TABLET | Freq: Three times a day (TID) | ORAL | Status: DC
  Administered 2012-09-14 (×2): 325 mg via ORAL
  Filled 2012-09-13 (×5): qty 1

## 2012-09-13 SURGICAL SUPPLY — 41 items
BAG URINE DRAINAGE (UROLOGICAL SUPPLIES) ×2 IMPLANT
BAG ZIPLOCK 12X15 (MISCELLANEOUS) ×4 IMPLANT
BLADE SAW SGTL 18X1.27X75 (BLADE) ×2 IMPLANT
CELLS DAT CNTRL 66122 CELL SVR (MISCELLANEOUS) ×1 IMPLANT
CLOTH BEACON ORANGE TIMEOUT ST (SAFETY) ×2 IMPLANT
DERMABOND ADVANCED (GAUZE/BANDAGES/DRESSINGS) ×1
DERMABOND ADVANCED .7 DNX12 (GAUZE/BANDAGES/DRESSINGS) ×1 IMPLANT
DRAPE C-ARM 42X72 X-RAY (DRAPES) ×2 IMPLANT
DRAPE STERI IOBAN 125X83 (DRAPES) ×2 IMPLANT
DRAPE U-SHAPE 47X51 STRL (DRAPES) ×6 IMPLANT
DRSG AQUACEL AG ADV 3.5X10 (GAUZE/BANDAGES/DRESSINGS) ×2 IMPLANT
DRSG TEGADERM 4X4.75 (GAUZE/BANDAGES/DRESSINGS) ×2 IMPLANT
DURAPREP 26ML APPLICATOR (WOUND CARE) ×2 IMPLANT
ELECT BLADE TIP CTD 4 INCH (ELECTRODE) ×2 IMPLANT
ELECT REM PT RETURN 9FT ADLT (ELECTROSURGICAL) ×2
ELECTRODE REM PT RTRN 9FT ADLT (ELECTROSURGICAL) ×1 IMPLANT
EVACUATOR 1/8 PVC DRAIN (DRAIN) ×2 IMPLANT
FACESHIELD LNG OPTICON STERILE (SAFETY) ×8 IMPLANT
GAUZE SPONGE 2X2 8PLY STRL LF (GAUZE/BANDAGES/DRESSINGS) ×1 IMPLANT
GLOVE BIOGEL PI IND STRL 7.5 (GLOVE) ×1 IMPLANT
GLOVE BIOGEL PI IND STRL 8 (GLOVE) ×1 IMPLANT
GLOVE BIOGEL PI INDICATOR 7.5 (GLOVE) ×1
GLOVE BIOGEL PI INDICATOR 8 (GLOVE) ×1
GLOVE ECLIPSE 7.5 STRL STRAW (GLOVE) ×4 IMPLANT
GLOVE ECLIPSE 8.0 STRL XLNG CF (GLOVE) ×2 IMPLANT
GLOVE ORTHO TXT STRL SZ7.5 (GLOVE) ×4 IMPLANT
GOWN BRE IMP PREV XXLGXLNG (GOWN DISPOSABLE) ×4 IMPLANT
GOWN STRL NON-REIN LRG LVL3 (GOWN DISPOSABLE) ×2 IMPLANT
KIT BASIN OR (CUSTOM PROCEDURE TRAY) ×2 IMPLANT
PACK TOTAL JOINT (CUSTOM PROCEDURE TRAY) ×2 IMPLANT
PADDING CAST COTTON 6X4 STRL (CAST SUPPLIES) ×2 IMPLANT
RTRCTR WOUND ALEXIS 18CM MED (MISCELLANEOUS) ×2
SPONGE GAUZE 2X2 STER 10/PKG (GAUZE/BANDAGES/DRESSINGS) ×1
SUCTION FRAZIER 12FR DISP (SUCTIONS) IMPLANT
SUT MNCRL AB 4-0 PS2 18 (SUTURE) ×2 IMPLANT
SUT VIC AB 1 CT1 36 (SUTURE) ×6 IMPLANT
SUT VIC AB 2-0 CT1 27 (SUTURE) ×2
SUT VIC AB 2-0 CT1 TAPERPNT 27 (SUTURE) ×2 IMPLANT
SUT VLOC 180 0 24IN GS25 (SUTURE) ×2 IMPLANT
TOWEL OR 17X26 10 PK STRL BLUE (TOWEL DISPOSABLE) ×4 IMPLANT
TRAY FOLEY CATH 14FRSI W/METER (CATHETERS) ×2 IMPLANT

## 2012-09-13 NOTE — Transfer of Care (Signed)
Immediate Anesthesia Transfer of Care Note  Patient: [name] [name]  Procedure(s) Performed: Procedure(s) (LRB) with comments: TOTAL HIP ARTHROPLASTY ANTERIOR APPROACH (Right)  Patient Location: PACU  Anesthesia Type:General  Level of Consciousness: sedated  Airway & Oxygen Therapy: Patient Spontanous Breathing and Patient connected to face mask oxygen  Post-op Assessment: Report given to PACU RN and Post -op Vital signs reviewed and stable  Post vital signs: Reviewed and stable  Complications: No apparent anesthesia complications

## 2012-09-13 NOTE — Progress Notes (Signed)
Utilization review completed.  

## 2012-09-13 NOTE — Care Management Note (Addendum)
    Page 1 of 1   09/14/2012     3:37:30 PM   CARE MANAGEMENT NOTE 09/14/2012  Patient:  [name] [name], [name] [name]   Account Number:  [id number]  Date Initiated:  09/13/2012  Documentation initiated by:  Colleen Can  Subjective/Objective Assessment:   dx rt hip osteoarthritis; total hip replacemnt     Action/Plan:   CM spoke with patient and spouse. Plans are for patient to return to her home in [place] where spouse will be caregiver. She already has RW  PT is pending. CM will follow   Anticipated DC Date:  09/15/2012   Anticipated DC Plan:  HOME W HOME HEALTH SERVICES      DC Planning Services  CM consult      [facility] Choice  HOME HEALTH   Choice offered to / List presented to:  C-3 Spouse        HH arranged  HH-2 PT      [facility] agency  [facility]   Status of service:  Completed, signed off Medicare Important Message given?  NA - LOS <3 / Initial given by admissions (If response is "NO", the following Medicare IM given date fields will be blank) Date Medicare IM given:   Date Additional Medicare IM given:    Discharge Disposition:  HOME W HOME HEALTH SERVICES  Per UR Regulation:    If discussed at Long Length of Stay Meetings, dates discussed:    Comments:  09/14/2012 Genevieve Norlander will provide [facility] services with start date 09/15/2012/lmanningBSN RN CCM [phone]

## 2012-09-13 NOTE — Evaluation (Signed)
Physical Therapy Evaluation Patient Details Name: [name] [name] MRN: [id number] DOB: July 30, 1946 Today's Date: 09/13/2012 Time: 4132-4401 PT Time Calculation (min): 28 min  PT Assessment / Plan / Recommendation Clinical Impression  Pt. is 67 yo female admitted 09/13/12 for direct anterior THA. Pt. tolerated ambulation in hall today with mild dizziness and some R hip pain. Pt. will benefit from PT to improve in functional mobility to DC to home tomorrow.     PT Assessment  Patient needs continued PT services    Follow Up Recommendations  Home health PT;Supervision/Assistance - 24 hour    Does the patient have the potential to tolerate intense rehabilitation      Barriers to Discharge        Equipment Recommendations       Recommendations for Other Services     Frequency 7X/week    Precautions / Restrictions Precautions Precaution Comments: peripheral neuropathy Restrictions Weight Bearing Restrictions: No   Pertinent Vitals/Pain 4 R hip pain after meds.      Mobility  Bed Mobility Bed Mobility: Supine to Sit;Sit to Supine Supine to Sit: 4: Min assist;HOB elevated Sit to Supine: 4: Min assist;HOB flat Details for Bed Mobility Assistance: support of RLE onto bed. Transfers Transfers: Sit to Stand;Stand to Sit Sit to Stand: 4: Min assist;From bed Stand to Sit: To bed;4: Min guard Details for Transfer Assistance: cues for RLE position and hand position. Ambulation/Gait Ambulation/Gait Assistance: 1: +2 Total assist Ambulation/Gait: Patient Percentage: 80% Ambulation Distance (Feet): 60 Feet Assistive device: Rolling walker Gait Pattern: Step-through pattern;Antalgic;Decreased stance time - right;Decreased step length - right    Exercises     PT Diagnosis: Difficulty walking;Acute pain  PT Problem List: Decreased strength;Decreased range of motion;Decreased activity tolerance;Decreased mobility;Decreased safety awareness;Decreased knowledge of use of DME;Pain;Impaired  sensation PT Treatment Interventions: DME instruction;Gait training;Stair training;Functional mobility training;Therapeutic activities;Therapeutic exercise;Patient/family education   PT Goals Acute Rehab PT Goals PT Goal Formulation: With patient/family Time For Goal Achievement: 09/16/12 Potential to Achieve Goals: Good Pt will go Supine/Side to Sit: with supervision PT Goal: Supine/Side to Sit - Progress: Goal set today Pt will go Sit to Supine/Side: with supervision PT Goal: Sit to Supine/Side - Progress: Goal set today Pt will go Sit to Stand: with supervision PT Goal: Sit to Stand - Progress: Goal set today Pt will go Stand to Sit: with supervision Pt will Ambulate: 51 - 150 feet;with supervision;with rolling walker PT Goal: Ambulate - Progress: Goal set today Pt will Go Up / Down Stairs: 1-2 stairs;with supervision;with rail(s) PT Goal: Up/Down Stairs - Progress: Goal set today Pt will Perform Home Exercise Program: with supervision, verbal cues required/provided PT Goal: Perform Home Exercise Program - Progress: Goal set today  Visit Information  Last PT Received On: 09/20/12 Assistance Needed: +1    Subjective Data  Subjective: I cannot believe I walked. Patient Stated Goal: to go home soon.   Prior Functioning  Home Living Lives With: Spouse Available Help at Discharge: Family Type of Home: House Home Access: Stairs to enter [employer] of Steps: 2 Entrance Stairs-Rails: Right;Left;Can reach both Home Layout: Two level;Able to live on main level with bedroom/bathroom Bathroom Shower/Tub: [employer] manager Toilet: Handicapped height Home Adaptive Equipment: Walker - rolling;Wheelchair - manual Prior Function Level of Independence: Independent with assistive device(s) Able to Take Stairs?: Yes Driving: Yes    Cognition  Cognition Overall Cognitive Status: Appears within functional limits for tasks assessed/performed Arousal/Alertness:  Awake/alert Orientation Level: Appears intact for tasks assessed  Behavior During Session: [facility] for tasks performed    Extremity/Trunk Assessment Right Lower Extremity Assessment RLE ROM/Strength/Tone: Deficits RLE ROM/Strength/Tone Deficits: pt is able to advance RLE, able to move leg to edge of bed to R RLE Sensation: History of peripheral neuropathy Left Lower Extremity Assessment LLE ROM/Strength/Tone: WFL for tasks assessed LLE Sensation: History of peripheral neuropathy Trunk Assessment Trunk Assessment: Normal   Balance    End of Session PT - End of Session Activity Tolerance: Patient tolerated treatment well Patient left: in bed;with call bell/phone within reach Nurse Communication: Mobility status  GP     Rada Hay 09/13/2012, 5:11 PM  [phone]

## 2012-09-13 NOTE — Progress Notes (Signed)
Spoke with pt about nighttime CPAP/BIPAP order. Per pt wears Auto titration BIPAP at home-- pt brought FFM from home. Set up auto max 20 min 5 cmh20 with humidification/oxygen bled in. Pt will call for any further assistance.

## 2012-09-13 NOTE — Interval H&P Note (Signed)
History and Physical Interval Note:  09/13/2012 8:40 AM  [name] [name]  has presented today for surgery, with the diagnosis of RIGHT HIP OSTEOARTHRITIS  The various methods of treatment have been discussed with the patient and family. After consideration of risks, benefits and other options for treatment, the patient has consented to  Procedure(s) (LRB) with comments: TOTAL HIP ARTHROPLASTY ANTERIOR APPROACH (Right) as a surgical intervention .  The patient's history has been reviewed, patient examined, no change in status, stable for surgery.  I have reviewed the patient's chart and labs.  Questions were answered to the patient's satisfaction.     Shelda Pal

## 2012-09-13 NOTE — Anesthesia Postprocedure Evaluation (Signed)
  Anesthesia Post-op Note  Patient: [name] [name]  Procedure(s) Performed: Procedure(s) (LRB): TOTAL HIP ARTHROPLASTY ANTERIOR APPROACH (Right)  Patient Location: PACU  Anesthesia Type: General  Level of Consciousness: awake and alert   Airway and Oxygen Therapy: Patient Spontanous Breathing  Post-op Pain: mild  Post-op Assessment: Post-op Vital signs reviewed, Patient's Cardiovascular Status Stable, Respiratory Function Stable, Patent Airway and No signs of Nausea or vomiting  Last Vitals:  Filed Vitals:   09/13/12 1620  BP: 110/68  Pulse: 82  Temp: 36.3 C  Resp: 14    Post-op Vital Signs: stable   Complications: No apparent anesthesia complications

## 2012-09-13 NOTE — Op Note (Signed)
NAME:  [name] [name]                ACCOUNT NO.: [id number]      MEDICAL RECORD NO.: [id number]      FACILITY:  [facility]      PHYSICIAN:  Durene Romans D  DATE OF BIRTH:  03/21/1946     DATE OF PROCEDURE:  09/13/2012                                 OPERATIVE REPORT         PREOPERATIVE DIAGNOSIS: Right  hip rheumatoid arthritis.      POSTOPERATIVE DIAGNOSIS:  Right hip rheumatoid arthritis.      PROCEDURE:  Right total hip replacement through an anterior approach   utilizing DePuy THR system, component size 52mm pinnacle cup, a size 36+4 neutral   Altrex liner, a size 4 standard Tri Lock stem with a 36+5 delta ceramic   ball.      SURGEON:  Madlyn Frankel. Charlann Boxer, M.D.      ASSISTANT:  Leilani Able, PA-C      ANESTHESIA:  General.      SPECIMENS:  None.      COMPLICATIONS:  None.      BLOOD LOSS:  700 cc     DRAINS:  One Hemovac.      INDICATION OF THE PROCEDURE:  [name] [name] is a 67 y.o. female who had   presented to office for evaluation of right hip pain.  Radiographs revealed   progressive degenerative changes with bone-on-bone   articulation to the  hip joint.  The patient had painful limited range of   motion significantly affecting their overall quality of life.  The patient was failing to    respond to conservative measures, and at this point was ready   to proceed with more definitive measures.  The patient has noted progressive   degenerative changes in his hip, progressive problems and dysfunction   with regarding the hip prior to surgery.  Consent was obtained for   benefit of pain relief.  Specific risk of infection, DVT, component   failure, dislocation, need for revision surgery, as well discussion of   the anterior versus posterior approach were reviewed.  Consent was   obtained for benefit of anterior pain relief through an anterior   approach.      PROCEDURE IN DETAIL:  The patient was brought to operative theater.   Once adequate  anesthesia, preoperative antibiotics, 2gm Ancef administered.   The patient was positioned supine on the OSI Hanna table.  Once adequate   padding of boney process was carried out, we had predraped out the hip, and  used fluoroscopy to confirm orientation of the pelvis and position.      The right hip was then prepped and draped from proximal iliac crest to   mid thigh with shower curtain technique.      Time-out was performed identifying the patient, planned procedure, and   extremity.     An incision was then made 2 cm distal and lateral to the   anterior superior iliac spine extending over the orientation of the   tensor fascia lata muscle and sharp dissection was carried down to the   fascia of the muscle and protractor placed in the soft tissues.      The fascia was then incised.  The muscle belly was  identified and swept   laterally and retractor placed along the superior neck.  Following   cauterization of the circumflex vessels and removing some pericapsular   fat, a second cobra retractor was placed on the inferior neck.  A third   retractor was placed on the anterior acetabulum after elevating the   anterior rectus.  A L-capsulotomy was along the line of the   superior neck to the trochanteric fossa, then extended proximally and   distally.  Tag sutures were placed and the retractors were then placed   intracapsular.  We then identified the trochanteric fossa and   orientation of my neck cut, confirmed this radiographically   and then made a neck osteotomy with the femur on traction.  The femoral   head was removed without difficulty or complication.  Traction was let   off and retractors were placed posterior and anterior around the   acetabulum.      The labrum and foveal tissue were debrided.  I began reaming with a 47mm   reamer and reamed up to 51mm reamer with good bony bed preparation and a 52   cup was chosen.  The final 52mm Pinnacle cup was then impacted under  fluoroscopy  to confirm the depth of penetration and orientation with respect to   abduction.  A screw was placed followed by the hole eliminator.  The final   36+4 neutral Altrex liner was impacted with good visualized rim fit.  The cup was positioned anatomically within the acetabular portion of the pelvis.      At this point, the femur was rolled at 80 degrees.  Further capsule was   released off the inferior aspect of the femoral neck.  I then   released the superior capsule proximally.  The hook was placed laterally   along the femur and elevated manually and held in position with the bed   hook.  The leg was then extended and adducted with the leg rolled to 100   degrees of external rotation.  Once the proximal femur was fully   exposed, I used a box osteotome to set orientation.  I then began   broaching with the starting chili pepper broach and passed this by hand and then broached up to 4.  With the 4 broach in place I chose a standard offset neck and did a trial reduction first with the +1.5 then the +5 ball.  With this combination the offset was appropriate, leg lengths   appeared to be equal, confirmed radiographically.   Given these findings, I went ahead and dislocated the hip, repositioned all   retractors and positioned the right hip in the extended and abducted position.  The final 4 standard Tri Lock stem was   chosen and it was impacted down to the level of neck cut.  Based on this   and the trial reduction, a 36+5 delta ceramic ball was chosen and   impacted onto a clean and dry trunnion, and the hip was reduced.  The   hip had been irrigated throughout the case again at this point.  I did   reapproximate the superior capsular leaflet to the anterior leaflet   using #1 Vicryl, placed a medium Hemovac drain deep.  The fascia of the   tensor fascia lata muscle was then reapproximated using #1 Vicryl.  The   remaining wound was closed with 2-0 Vicryl and running 4-0 Monocryl.    The hip was cleaned, dried, and dressed  sterilely using Dermabond and   Aquacel dressing.  Drain site dressed separately.  She was then brought   to recovery room in stable condition tolerating the procedure well.    Leilani Able, PA-C was present for the entirety of the case involved from   preoperative positioning, perioperative retractor management, general   facilitation of the case, as well as primary wound closure as assistant.            Madlyn Frankel Charlann Boxer, M.D.            MDO/MEDQ  D:  06/02/2011  T:  06/02/2011  Job:  [id number]      Electronically Signed by Durene Romans M.D. on 06/08/2011 09:15:38 AM

## 2012-09-13 NOTE — Anesthesia Preprocedure Evaluation (Addendum)
Anesthesia Evaluation  Patient identified by MRN, date of birth, ID band Patient awake    Reviewed: Allergy & Precautions, H&P , NPO status , Patient's Chart, lab work & pertinent test results, reviewed documented beta blocker date and time   Airway Mallampati: II TM Distance: >3 FB Neck ROM: full    Dental  (+) Caps and Dental Advisory Given,    Pulmonary sleep apnea ,  breath sounds clear to auscultation  Pulmonary exam normal       Cardiovascular Exercise Tolerance: Good hypertension, Pt. on medications Rhythm:regular Rate:Normal     Neuro/Psych negative neurological ROS  negative psych ROS   GI/Hepatic negative GI ROS, Neg liver ROS, GERD-  Medicated and Controlled,  Endo/Other  negative endocrine ROSdiabetes, Well Controlled, Type 2, Oral Hypoglycemic AgentsMorbid obesity  Renal/GU negative Renal ROS  negative genitourinary   Musculoskeletal   Abdominal (+) + obese,   Peds  Hematology negative hematology ROS (+)   Anesthesia Other Findings   Reproductive/Obstetrics negative OB ROS                          Anesthesia Physical Anesthesia Plan  ASA: III  Anesthesia Plan: General   Post-op Pain Management:    Induction: Intravenous  Airway Management Planned: Oral ETT  Additional Equipment:   Intra-op Plan:   Post-operative Plan: Extubation in OR  Informed Consent: I have reviewed the patients History and Physical, chart, labs and discussed the procedure including the risks, benefits and alternatives for the proposed anesthesia with the patient or authorized representative who has indicated his/her understanding and acceptance.   Dental Advisory Given  Plan Discussed with: CRNA and Surgeon  Anesthesia Plan Comments:         Anesthesia Quick Evaluation

## 2012-09-14 ENCOUNTER — Encounter (HOSPITAL_COMMUNITY): Payer: Self-pay | Admitting: Orthopedic Surgery

## 2012-09-14 DIAGNOSIS — E669 Obesity, unspecified: Secondary | ICD-10-CM | POA: Diagnosis present

## 2012-09-14 DIAGNOSIS — D500 Iron deficiency anemia secondary to blood loss (chronic): Secondary | ICD-10-CM | POA: Diagnosis not present

## 2012-09-14 DIAGNOSIS — E871 Hypo-osmolality and hyponatremia: Secondary | ICD-10-CM | POA: Diagnosis not present

## 2012-09-14 LAB — CBC
HCT: 29.8 % — ABNORMAL LOW (ref 36.0–46.0)
Hemoglobin: 10.4 g/dL — ABNORMAL LOW (ref 12.0–15.0)
MCH: 32.4 pg (ref 26.0–34.0)
MCHC: 34.9 g/dL (ref 30.0–36.0)
MCV: 92.8 fL (ref 78.0–100.0)
Platelets: 181 10*3/uL (ref 150–400)
RBC: 3.21 MIL/uL — ABNORMAL LOW (ref 3.87–5.11)
RDW: 13.6 % (ref 11.5–15.5)
WBC: 13.7 10*3/uL — ABNORMAL HIGH (ref 4.0–10.5)

## 2012-09-14 LAB — BASIC METABOLIC PANEL
BUN: 16 mg/dL (ref 6–23)
CO2: 27 mEq/L (ref 19–32)
Calcium: 8.6 mg/dL (ref 8.4–10.5)
Chloride: 97 mEq/L (ref 96–112)
Creatinine, Ser: 0.69 mg/dL (ref 0.50–1.10)
GFR calc Af Amer: >90 mL/min (ref >90)
GFR calc non Af Amer: 88 mL/min — ABNORMAL LOW (ref >90)
Glucose, Bld: 181 mg/dL — ABNORMAL HIGH (ref 70–99)
Potassium: 4.5 mEq/L (ref 3.5–5.1)
Sodium: 131 mEq/L — ABNORMAL LOW (ref 135–145)

## 2012-09-14 MED ORDER — POLYETHYLENE GLYCOL 3350 17 G PO PACK: 17.0000 g | PACK | Freq: Two times a day (BID) | ORAL | Status: DC

## 2012-09-14 MED ORDER — METHOCARBAMOL 500 MG PO TABS: 500.0000 mg | ORAL_TABLET | Freq: Four times a day (QID) | ORAL | Status: DC | PRN

## 2012-09-14 MED ORDER — HYDROCODONE-ACETAMINOPHEN 7.5-325 MG PO TABS: 1.0000 | ORAL_TABLET | ORAL | Status: DC | PRN

## 2012-09-14 MED ORDER — FERROUS SULFATE 325 (65 FE) MG PO TABS: 325.0000 mg | ORAL_TABLET | Freq: Three times a day (TID) | ORAL | Status: DC

## 2012-09-14 MED ORDER — DSS 100 MG PO CAPS: 100.0000 mg | ORAL_CAPSULE | Freq: Two times a day (BID) | ORAL | Status: DC

## 2012-09-14 NOTE — Progress Notes (Signed)
CSW consulted for SNF placement. CSW met with pt and reviewed PN / recommendations. Pt plans to return home following hospital d/c. RNCM will assist with d/c planning needs.  Cori Razor LCSW [phone]

## 2012-09-14 NOTE — Progress Notes (Signed)
   Subjective: 1 Day Post-Op Procedure(s) (LRB): TOTAL HIP ARTHROPLASTY ANTERIOR APPROACH (Right)   Patient reports pain as mild, pain well controlled. No events throughout the night. Ready to be discharged home if does well with PT.  Objective:   VITALS:   Filed Vitals:   09/14/12  BP: 106/65  Pulse: 77  Temp: 98 F (36.7 C)   Resp: 16    Neurovascular intact Dorsiflexion/Plantar flexion intact Incision: dressing C/D/I No cellulitis present Compartment soft  LABS  Basename 09/14/12 0350  HGB 10.4*  HCT 29.8*  WBC 13.7*  PLT 181     Basename 09/14/12 0350  NA 131*  K 4.5  BUN 16  CREATININE 0.69  GLUCOSE 181*     Assessment/Plan: 1 Day Post-Op Procedure(s) (LRB): TOTAL HIP ARTHROPLASTY ANTERIOR APPROACH (Right) HV drain d/c'ed Foley cath d/c'ed Advance diet Up with therapy D/C IV fluids Discharge home with home health Follow up in 2 weeks at [facility]. Follow up with OLIN,Chezney Huether D in 2 weeks.  Contact information:  [facility] [address] [place] [address] [phone]    Expected ABLA  Treated with iron and will observe  Obese (BMI 30-39.9) Estimated Body mass index is 38.58 kg/(m^2) as calculated from the following:   Height as of this encounter: 5\' 6" (1.676 m).   Weight as of this encounter: 239 lb(108.41 kg). Patient also counseled that weight may inhibit the healing process Patient counseled that losing weight will help with future health issues  Hyponatremia Treated with IV fluids and will observe       Anastasio Auerbach. Arlette Schaad   PAC  09/14/2012, 9:02 AM

## 2012-09-14 NOTE — Progress Notes (Signed)
Pt to d/c home. AVS reviewed and "My Chart" discussed with pt. Pt capable of verbalizing medications and follow-up appointments. Remains hemodynamically stable. No signs and symptoms of distress. Educated pt to return to ER in the case of SOB, dizziness, or chest pain.  

## 2012-09-14 NOTE — Evaluation (Signed)
Occupational Therapy Evaluation Patient Details Name: [name] [name] MRN: [id number] DOB: 04-02-1946 Today's Date: 09/14/2012 Time: 4098-1191 OT Time Calculation (min): 17 min  OT Assessment / Plan / Recommendation Clinical Impression  This 67 year old female was admitted for R anterior direct THA.  All education was completed.      OT Assessment  Patient does not need any further OT services    Follow Up Recommendations  No OT follow up    Barriers to Discharge      Equipment Recommendations  None recommended by OT    Recommendations for Other Services    Frequency       Precautions / Restrictions Precautions Precautions: None Precaution Comments: anterior direct THA Restrictions Weight Bearing Restrictions: No   Pertinent Vitals/Pain None at rest:  Up to a 4/5 out of 10 ambulating to bathroom, R hip.  Requested pain med and repositioned with ice applied    ADL  Upper Body Bathing: Simulated;Set up Where Assessed - Upper Body Bathing: Unsupported sitting Lower Body Bathing: Simulated;Minimal assistance Where Assessed - Lower Body Bathing: Supported sit to stand Upper Body Dressing: Simulated;Minimal assistance (iv) Where Assessed - Upper Body Dressing: Unsupported sitting Lower Body Dressing: Simulated;Minimal assistance Where Assessed - Lower Body Dressing: Supported sit to stand Toilet Transfer: Performed;Min guard Statistician Method: Sit to Barista: Comfort height toilet (will use walker and tank to stand--no sink next to it) Toileting - Architect and Hygiene: Simulated;Min guard Where Assessed - Engineer, mining and Hygiene: Sit to stand from 3-in-1 or toilet Tub/Shower Transfer: Performed;Min guard Tub/Shower Transfer Method: Science writer: Walk in shower Equipment Used: Rolling walker Transfers/Ambulation Related to ADLs: pt feels a little lightheaded this am:  BP low normal per  pt. She didn't sleep well ADL Comments: has needed a little help with dressing:  tying shoes.  She has long sponge and shoehorn.  Doesn't need reacher.  Pt moves quickly    OT Diagnosis:    OT Problem List:   OT Treatment Interventions:     OT Goals    Visit Information  Last OT Received On: 09/14/12 Assistance Needed: +1    Subjective Data  Subjective: I'm glad you think I'm doing well Patient Stated Goal: home today   Prior Functioning     Home Living Lives With: Spouse Available Help at Discharge: Family Type of Home: House Home Access: Stairs to enter [employer] of Steps: 2 Entrance Stairs-Rails: Right;Left;Can reach both Home Layout: Two level;Able to live on main level with bedroom/bathroom Bathroom Shower/Tub: Health visitor: Handicapped height (17 1/2) Home Adaptive Equipment: Walker - rolling;Wheelchair - manual (seat in shower) Prior Function Level of Independence: Independent with assistive device(s) Driving: Yes Communication Communication: No difficulties Dominant Hand: Right         Vision/Perception     Cognition  Cognition Overall Cognitive Status: Appears within functional limits for tasks assessed/performed Arousal/Alertness: Awake/alert Orientation Level: Appears intact for tasks assessed Behavior During Session: [facility] for tasks performed    Extremity/Trunk Assessment Right Upper Extremity Assessment RUE ROM/Strength/Tone: [facility] for tasks assessed Left Upper Extremity Assessment LUE ROM/Strength/Tone: WFL for tasks assessed     Mobility Bed Mobility Supine to Sit: 5: Supervision;With rails;HOB elevated Transfers Sit to Stand: 4: Min guard;With upper extremity assist Details for Transfer Assistance: cues for hand placement     Exercise     Balance     End of Session OT - End of Session  Activity Tolerance: Patient tolerated treatment well Patient left: in chair;with call bell/phone within reach  GO      [facility] 09/14/2012, 9:47 AM Marica Otter, OTR/L [phone] 09/14/2012

## 2012-09-15 DIAGNOSIS — Z96649 Presence of unspecified artificial hip joint: Secondary | ICD-10-CM | POA: Diagnosis not present

## 2012-09-15 DIAGNOSIS — M1710 Unilateral primary osteoarthritis, unspecified knee: Secondary | ICD-10-CM | POA: Diagnosis not present

## 2012-09-15 DIAGNOSIS — IMO0001 Reserved for inherently not codable concepts without codable children: Secondary | ICD-10-CM | POA: Diagnosis not present

## 2012-09-15 DIAGNOSIS — E1142 Type 2 diabetes mellitus with diabetic polyneuropathy: Secondary | ICD-10-CM | POA: Diagnosis not present

## 2012-09-15 DIAGNOSIS — Z471 Aftercare following joint replacement surgery: Secondary | ICD-10-CM | POA: Diagnosis not present

## 2012-09-15 DIAGNOSIS — E1149 Type 2 diabetes mellitus with other diabetic neurological complication: Secondary | ICD-10-CM | POA: Diagnosis not present

## 2012-09-15 DIAGNOSIS — IMO0002 Reserved for concepts with insufficient information to code with codable children: Secondary | ICD-10-CM | POA: Diagnosis not present

## 2012-09-15 DIAGNOSIS — M25569 Pain in unspecified knee: Secondary | ICD-10-CM | POA: Diagnosis not present

## 2012-09-15 MED FILL — Bacitracin Zinc Oint 500 Unit/GM: CUTANEOUS | Qty: 15 | Status: AC

## 2012-09-19 DIAGNOSIS — Z96649 Presence of unspecified artificial hip joint: Secondary | ICD-10-CM | POA: Diagnosis not present

## 2012-09-19 DIAGNOSIS — Z471 Aftercare following joint replacement surgery: Secondary | ICD-10-CM | POA: Diagnosis not present

## 2012-09-19 DIAGNOSIS — E1142 Type 2 diabetes mellitus with diabetic polyneuropathy: Secondary | ICD-10-CM | POA: Diagnosis not present

## 2012-09-19 DIAGNOSIS — E1149 Type 2 diabetes mellitus with other diabetic neurological complication: Secondary | ICD-10-CM | POA: Diagnosis not present

## 2012-09-19 DIAGNOSIS — IMO0001 Reserved for inherently not codable concepts without codable children: Secondary | ICD-10-CM | POA: Diagnosis not present

## 2012-09-20 DIAGNOSIS — Z471 Aftercare following joint replacement surgery: Secondary | ICD-10-CM | POA: Diagnosis not present

## 2012-09-20 DIAGNOSIS — E1142 Type 2 diabetes mellitus with diabetic polyneuropathy: Secondary | ICD-10-CM | POA: Diagnosis not present

## 2012-09-20 DIAGNOSIS — Z96649 Presence of unspecified artificial hip joint: Secondary | ICD-10-CM | POA: Diagnosis not present

## 2012-09-20 DIAGNOSIS — E1149 Type 2 diabetes mellitus with other diabetic neurological complication: Secondary | ICD-10-CM | POA: Diagnosis not present

## 2012-09-20 DIAGNOSIS — IMO0001 Reserved for inherently not codable concepts without codable children: Secondary | ICD-10-CM | POA: Diagnosis not present

## 2012-09-23 DIAGNOSIS — Z471 Aftercare following joint replacement surgery: Secondary | ICD-10-CM | POA: Diagnosis not present

## 2012-09-23 DIAGNOSIS — Z96649 Presence of unspecified artificial hip joint: Secondary | ICD-10-CM | POA: Diagnosis not present

## 2012-09-23 DIAGNOSIS — IMO0001 Reserved for inherently not codable concepts without codable children: Secondary | ICD-10-CM | POA: Diagnosis not present

## 2012-09-23 DIAGNOSIS — E1142 Type 2 diabetes mellitus with diabetic polyneuropathy: Secondary | ICD-10-CM | POA: Diagnosis not present

## 2012-09-23 DIAGNOSIS — E1149 Type 2 diabetes mellitus with other diabetic neurological complication: Secondary | ICD-10-CM | POA: Diagnosis not present

## 2012-09-25 NOTE — Discharge Summary (Signed)
Physician Discharge Summary  Patient ID: [name] [name] MRN: [id number] DOB/AGE: November 14, 1945 67 y.o.  Admit date: 09/13/2012 Discharge date: 09/14/2012   Procedures:  Procedure(s) (LRB): TOTAL HIP ARTHROPLASTY ANTERIOR APPROACH (Right)  Attending Physician:  Dr. Durene Romans   Admission Diagnoses:   Right hip OA / pain  Discharge Diagnoses:  Principal Problem:   S/P right THA, AA Active Problems:   Expected blood loss anemia   Obesity   Hyponatremia  Diagnosis  . Hypertension  . Sleep apnea - bipap  . Diabetes mellitus without complication - diet controlled   . Deep vein thrombosis - right calf - 05/2012   . GERD (gastroesophageal reflux disease)  . Arthritis  . Neuropathy - diabetic - in bilateral feet   . Pityriasis lichenoides chronica  . Lymphocytic colitis    HPI: [name] [name], 67 y.o. female, has a history of pain and functional disability in the right hip(s) due to arthritis and patient has failed non-surgical conservative treatments for greater than 12 weeks to include NSAID's and/or analgesics, corticosteriod injections and activity modification. Onset of symptoms was gradual starting 3 years ago with gradually worsening course since that time.The patient noted no past surgery on the right hip(s). Patient currently rates pain in the right hip at 7 out of 10 with activity. Patient has night pain, worsening of pain with activity and weight bearing, trendelenberg gait, pain that interfers with activities of daily living and pain with passive range of motion. Patient has evidence of periarticular osteophytes and joint space narrowing by imaging studies. This condition presents safety issues increasing the risk of falls. There is no current active infection. Risks, benefits and expectations were discussed with the patient. Patient understand the risks, benefits and expectations and wishes to proceed with surgery.   PCP: Thayer Headings, MD   Discharged Condition:  good  Hospital Course:  Patient underwent the above stated procedure on 09/13/2012. Patient tolerated the procedure well and brought to the recovery room in good condition and subsequently to the floor.  POD #1 BP: 106/65 ; Pulse: 77 ; Temp: 98 F (36.7 C) ; Resp: 16  Pt's foley was removed, as well as the hemovac drain removed. IV was changed to a saline lock. Patient reports pain as mild, pain well controlled. No events throughout the night. Ready to be discharged home if does well with PT. Neurovascular intact, dorsiflexion/plantar flexion intact, incision: dressing C/D/I, no cellulitis present and compartment soft.   LABS  Basename  09/14/12 0350  HGB  10.4  HCT  29.8    Discharge Exam: General appearance: alert, cooperative and no distress Extremities: Homans sign is negative, no sign of DVT, no edema, redness or tenderness in the calves or thighs and no ulcers, gangrene or trophic changes  Disposition:   Home-Health Care Svc with follow up in 2 weeks   Follow-up Information   Follow up with Shelda Pal, MD. Schedule an appointment as soon as possible for a visit in 2 weeks.   Contact information:   [address] [place] [address] [phone]       Discharge Orders   Future Orders Complete By Expires     Call MD / Call 911  As directed     Comments:      If you experience chest pain or shortness of breath, CALL 911 and be transported to the hospital emergency room.  If you develope a fever above 101 F, pus (white drainage) or increased drainage or  redness at the wound, or calf pain, call your surgeon's office.    Change dressing  As directed     Comments:      Maintain surgical dressing for 10-14 days, then replace with 4x4 guaze and tape. Keep the area dry and clean.    Constipation Prevention  As directed     Comments:      Drink plenty of fluids.  Prune juice may be helpful.  You may use a stool softener, such as Colace (over the counter) 100 mg  twice a day.  Use MiraLax (over the counter) for constipation as needed.    Diet - low sodium heart healthy  As directed     Discharge instructions  As directed     Comments:      Maintain surgical dressing for 10-14 days, then replace with gauze and tape. Keep the area dry and clean until follow up. Follow up in 2 weeks at [facility]. Call with any questions or concerns.    Increase activity slowly as tolerated  As directed     TED hose  As directed     Comments:      Use stockings (TED hose) for 2 weeks on both leg(s).  You may remove them at night for sleeping.    Weight bearing as tolerated  As directed          Medication List    STOP taking these medications       cyclobenzaprine 10 MG tablet  Commonly known as:  FLEXERIL     meloxicam 15 MG tablet  Commonly known as:  MOBIC     methotrexate 2.5 MG tablet  Commonly known as:  RHEUMATREX      TAKE these medications       allopurinol 100 MG tablet  Commonly known as:  ZYLOPRIM  Take 100 mg by mouth 2 (two) times daily.     citalopram 20 MG tablet  Commonly known as:  CELEXA  Take 20 mg by mouth at bedtime.     DSS 100 MG Caps  Take 100 mg by mouth 2 (two) times daily.     ferrous sulfate 325 (65 FE) MG tablet  Take 1 tablet (325 mg total) by mouth 3 (three) times daily after meals.     FOLIC ACID PO  Take 1 tablet by mouth 2 (two) times daily.     gabapentin 600 MG tablet  Commonly known as:  NEURONTIN  Take 600 mg by mouth 3 (three) times daily.     HUMIRA PEN 40 MG/0.8ML injection  Generic drug:  adalimumab  Inject 40 mg into the skin every 14 (fourteen) days. She takes on Sunday.     HYDROcodone-acetaminophen 7.5-325 MG per tablet  Commonly known as:  NORCO  Take 1-2 tablets by mouth every 4 (four) hours as needed for pain.     lisinopril-hydrochlorothiazide 20-12.5 MG per tablet  Commonly known as:  PRINZIDE,ZESTORETIC  Take 1 tablet by mouth every morning.     loperamide 2 MG  capsule  Commonly known as:  IMODIUM  Take 6 mg by mouth 2 (two) times daily.     methocarbamol 500 MG tablet  Commonly known as:  ROBAXIN  Take 1 tablet (500 mg total) by mouth every 6 (six) hours as needed (muscle spasms).     mometasone 0.1 % cream  Commonly known as:  ELOCON  Apply 1 application topically daily as needed. For psoriasis.     mupirocin cream  2 %  Commonly known as:  BACTROBAN  Apply 1 application topically 2 (two) times daily as needed. For psoriasis.     omeprazole 40 MG capsule  Commonly known as:  PRILOSEC  Take 40 mg by mouth at bedtime.     polyethylene glycol packet  Commonly known as:  MIRALAX / GLYCOLAX  Take 17 g by mouth 2 (two) times daily.     PRESCRIPTION MEDICATION  Place 3 drops into both ears 2 (two) times daily as needed. Fluocinolone 0.01% Ear Drops. For inflammation and itching of the ear.     Vitamin D3 1000 UNITS Caps  Take 1,000 Units by mouth every morning.     XARELTO 20 MG Tabs  Generic drug:  Rivaroxaban  Take 20 mg by mouth daily with supper.         Signed: Anastasio Auerbach. Mayli Covington   PAC  09/25/2012, 8:35 PM

## 2012-09-26 DIAGNOSIS — Z96649 Presence of unspecified artificial hip joint: Secondary | ICD-10-CM | POA: Diagnosis not present

## 2012-09-26 DIAGNOSIS — E1142 Type 2 diabetes mellitus with diabetic polyneuropathy: Secondary | ICD-10-CM | POA: Diagnosis not present

## 2012-09-26 DIAGNOSIS — IMO0001 Reserved for inherently not codable concepts without codable children: Secondary | ICD-10-CM | POA: Diagnosis not present

## 2012-09-26 DIAGNOSIS — E1149 Type 2 diabetes mellitus with other diabetic neurological complication: Secondary | ICD-10-CM | POA: Diagnosis not present

## 2012-09-26 DIAGNOSIS — Z471 Aftercare following joint replacement surgery: Secondary | ICD-10-CM | POA: Diagnosis not present

## 2012-09-28 DIAGNOSIS — E1142 Type 2 diabetes mellitus with diabetic polyneuropathy: Secondary | ICD-10-CM | POA: Diagnosis not present

## 2012-09-28 DIAGNOSIS — IMO0001 Reserved for inherently not codable concepts without codable children: Secondary | ICD-10-CM | POA: Diagnosis not present

## 2012-09-28 DIAGNOSIS — E1149 Type 2 diabetes mellitus with other diabetic neurological complication: Secondary | ICD-10-CM | POA: Diagnosis not present

## 2012-09-28 DIAGNOSIS — Z96649 Presence of unspecified artificial hip joint: Secondary | ICD-10-CM | POA: Diagnosis not present

## 2012-09-28 DIAGNOSIS — Z471 Aftercare following joint replacement surgery: Secondary | ICD-10-CM | POA: Diagnosis not present

## 2012-09-30 DIAGNOSIS — Z96649 Presence of unspecified artificial hip joint: Secondary | ICD-10-CM | POA: Diagnosis not present

## 2012-09-30 DIAGNOSIS — E1149 Type 2 diabetes mellitus with other diabetic neurological complication: Secondary | ICD-10-CM | POA: Diagnosis not present

## 2012-09-30 DIAGNOSIS — IMO0001 Reserved for inherently not codable concepts without codable children: Secondary | ICD-10-CM | POA: Diagnosis not present

## 2012-09-30 DIAGNOSIS — E1142 Type 2 diabetes mellitus with diabetic polyneuropathy: Secondary | ICD-10-CM | POA: Diagnosis not present

## 2012-09-30 DIAGNOSIS — Z471 Aftercare following joint replacement surgery: Secondary | ICD-10-CM | POA: Diagnosis not present

## 2012-10-24 DIAGNOSIS — H52209 Unspecified astigmatism, unspecified eye: Secondary | ICD-10-CM | POA: Diagnosis not present

## 2012-10-24 DIAGNOSIS — H25019 Cortical age-related cataract, unspecified eye: Secondary | ICD-10-CM | POA: Diagnosis not present

## 2012-10-24 DIAGNOSIS — H04129 Dry eye syndrome of unspecified lacrimal gland: Secondary | ICD-10-CM | POA: Diagnosis not present

## 2012-10-24 DIAGNOSIS — E119 Type 2 diabetes mellitus without complications: Secondary | ICD-10-CM | POA: Diagnosis not present

## 2012-10-27 DIAGNOSIS — K5289 Other specified noninfective gastroenteritis and colitis: Secondary | ICD-10-CM | POA: Diagnosis not present

## 2012-10-27 DIAGNOSIS — M2550 Pain in unspecified joint: Secondary | ICD-10-CM | POA: Diagnosis not present

## 2012-10-27 DIAGNOSIS — L4050 Arthropathic psoriasis, unspecified: Secondary | ICD-10-CM | POA: Diagnosis not present

## 2012-10-27 DIAGNOSIS — M1990 Unspecified osteoarthritis, unspecified site: Secondary | ICD-10-CM | POA: Diagnosis not present

## 2012-10-27 DIAGNOSIS — Z79899 Other long term (current) drug therapy: Secondary | ICD-10-CM | POA: Diagnosis not present

## 2012-10-27 DIAGNOSIS — L408 Other psoriasis: Secondary | ICD-10-CM | POA: Diagnosis not present

## 2012-11-02 DIAGNOSIS — M25569 Pain in unspecified knee: Secondary | ICD-10-CM | POA: Diagnosis not present

## 2012-11-02 DIAGNOSIS — M1710 Unilateral primary osteoarthritis, unspecified knee: Secondary | ICD-10-CM | POA: Diagnosis not present

## 2012-11-02 DIAGNOSIS — IMO0002 Reserved for concepts with insufficient information to code with codable children: Secondary | ICD-10-CM | POA: Diagnosis not present

## 2012-11-02 DIAGNOSIS — Z96649 Presence of unspecified artificial hip joint: Secondary | ICD-10-CM | POA: Diagnosis not present

## 2012-11-10 DIAGNOSIS — I82409 Acute embolism and thrombosis of unspecified deep veins of unspecified lower extremity: Secondary | ICD-10-CM | POA: Diagnosis not present

## 2012-11-10 DIAGNOSIS — I119 Hypertensive heart disease without heart failure: Secondary | ICD-10-CM | POA: Diagnosis not present

## 2012-11-10 DIAGNOSIS — G4733 Obstructive sleep apnea (adult) (pediatric): Secondary | ICD-10-CM | POA: Diagnosis not present

## 2012-12-05 DIAGNOSIS — B351 Tinea unguium: Secondary | ICD-10-CM | POA: Diagnosis not present

## 2012-12-05 DIAGNOSIS — M79609 Pain in unspecified limb: Secondary | ICD-10-CM | POA: Diagnosis not present

## 2012-12-06 DIAGNOSIS — I119 Hypertensive heart disease without heart failure: Secondary | ICD-10-CM | POA: Diagnosis not present

## 2012-12-06 DIAGNOSIS — G4733 Obstructive sleep apnea (adult) (pediatric): Secondary | ICD-10-CM | POA: Diagnosis not present

## 2012-12-14 DIAGNOSIS — M1710 Unilateral primary osteoarthritis, unspecified knee: Secondary | ICD-10-CM | POA: Diagnosis not present

## 2012-12-14 DIAGNOSIS — IMO0002 Reserved for concepts with insufficient information to code with codable children: Secondary | ICD-10-CM | POA: Diagnosis not present

## 2012-12-14 DIAGNOSIS — M25569 Pain in unspecified knee: Secondary | ICD-10-CM | POA: Diagnosis not present

## 2013-01-03 DIAGNOSIS — H52209 Unspecified astigmatism, unspecified eye: Secondary | ICD-10-CM | POA: Diagnosis not present

## 2013-01-03 DIAGNOSIS — H269 Unspecified cataract: Secondary | ICD-10-CM | POA: Diagnosis not present

## 2013-01-03 DIAGNOSIS — H2510 Age-related nuclear cataract, unspecified eye: Secondary | ICD-10-CM | POA: Diagnosis not present

## 2013-01-03 DIAGNOSIS — H25049 Posterior subcapsular polar age-related cataract, unspecified eye: Secondary | ICD-10-CM | POA: Diagnosis not present

## 2013-01-03 DIAGNOSIS — H25039 Anterior subcapsular polar age-related cataract, unspecified eye: Secondary | ICD-10-CM | POA: Diagnosis not present

## 2013-01-05 DIAGNOSIS — R32 Unspecified urinary incontinence: Secondary | ICD-10-CM | POA: Diagnosis not present

## 2013-01-05 DIAGNOSIS — Z01419 Encounter for gynecological examination (general) (routine) without abnormal findings: Secondary | ICD-10-CM | POA: Diagnosis not present

## 2013-01-05 DIAGNOSIS — Z124 Encounter for screening for malignant neoplasm of cervix: Secondary | ICD-10-CM | POA: Diagnosis not present

## 2013-01-16 DIAGNOSIS — M1710 Unilateral primary osteoarthritis, unspecified knee: Secondary | ICD-10-CM | POA: Diagnosis not present

## 2013-01-16 DIAGNOSIS — M545 Low back pain, unspecified: Secondary | ICD-10-CM | POA: Diagnosis not present

## 2013-01-16 DIAGNOSIS — IMO0002 Reserved for concepts with insufficient information to code with codable children: Secondary | ICD-10-CM | POA: Diagnosis not present

## 2013-01-19 DIAGNOSIS — L408 Other psoriasis: Secondary | ICD-10-CM | POA: Diagnosis not present

## 2013-01-19 DIAGNOSIS — M109 Gout, unspecified: Secondary | ICD-10-CM | POA: Diagnosis not present

## 2013-01-19 DIAGNOSIS — L4050 Arthropathic psoriasis, unspecified: Secondary | ICD-10-CM | POA: Diagnosis not present

## 2013-01-19 DIAGNOSIS — K5289 Other specified noninfective gastroenteritis and colitis: Secondary | ICD-10-CM | POA: Diagnosis not present

## 2013-01-24 DIAGNOSIS — M1710 Unilateral primary osteoarthritis, unspecified knee: Secondary | ICD-10-CM | POA: Diagnosis not present

## 2013-01-24 DIAGNOSIS — IMO0002 Reserved for concepts with insufficient information to code with codable children: Secondary | ICD-10-CM | POA: Diagnosis not present

## 2013-01-25 DIAGNOSIS — D3130 Benign neoplasm of unspecified choroid: Secondary | ICD-10-CM | POA: Diagnosis not present

## 2013-02-03 DIAGNOSIS — IMO0002 Reserved for concepts with insufficient information to code with codable children: Secondary | ICD-10-CM | POA: Diagnosis not present

## 2013-02-03 DIAGNOSIS — M1710 Unilateral primary osteoarthritis, unspecified knee: Secondary | ICD-10-CM | POA: Diagnosis not present

## 2013-02-09 DIAGNOSIS — IMO0002 Reserved for concepts with insufficient information to code with codable children: Secondary | ICD-10-CM | POA: Diagnosis not present

## 2013-02-09 DIAGNOSIS — M1710 Unilateral primary osteoarthritis, unspecified knee: Secondary | ICD-10-CM | POA: Diagnosis not present

## 2013-02-13 DIAGNOSIS — E785 Hyperlipidemia, unspecified: Secondary | ICD-10-CM | POA: Diagnosis not present

## 2013-02-13 DIAGNOSIS — M109 Gout, unspecified: Secondary | ICD-10-CM | POA: Diagnosis not present

## 2013-02-13 DIAGNOSIS — I10 Essential (primary) hypertension: Secondary | ICD-10-CM | POA: Diagnosis not present

## 2013-02-13 DIAGNOSIS — N390 Urinary tract infection, site not specified: Secondary | ICD-10-CM | POA: Diagnosis not present

## 2013-02-13 DIAGNOSIS — E119 Type 2 diabetes mellitus without complications: Secondary | ICD-10-CM | POA: Diagnosis not present

## 2013-02-13 DIAGNOSIS — E559 Vitamin D deficiency, unspecified: Secondary | ICD-10-CM | POA: Diagnosis not present

## 2013-02-16 DIAGNOSIS — M1710 Unilateral primary osteoarthritis, unspecified knee: Secondary | ICD-10-CM | POA: Diagnosis not present

## 2013-02-16 DIAGNOSIS — IMO0002 Reserved for concepts with insufficient information to code with codable children: Secondary | ICD-10-CM | POA: Diagnosis not present

## 2013-02-20 ENCOUNTER — Ambulatory Visit: Payer: Medicare Other | Admitting: Cardiovascular Disease

## 2013-02-20 DIAGNOSIS — I10 Essential (primary) hypertension: Secondary | ICD-10-CM | POA: Diagnosis not present

## 2013-02-20 DIAGNOSIS — E785 Hyperlipidemia, unspecified: Secondary | ICD-10-CM | POA: Diagnosis not present

## 2013-02-20 DIAGNOSIS — E119 Type 2 diabetes mellitus without complications: Secondary | ICD-10-CM | POA: Diagnosis not present

## 2013-02-20 DIAGNOSIS — M109 Gout, unspecified: Secondary | ICD-10-CM | POA: Diagnosis not present

## 2013-02-23 DIAGNOSIS — Z1231 Encounter for screening mammogram for malignant neoplasm of breast: Secondary | ICD-10-CM | POA: Diagnosis not present

## 2013-03-03 ENCOUNTER — Encounter: Payer: Self-pay | Admitting: Cardiovascular Disease

## 2013-03-03 ENCOUNTER — Ambulatory Visit (INDEPENDENT_AMBULATORY_CARE_PROVIDER_SITE_OTHER): Payer: Medicare Other | Admitting: Cardiovascular Disease

## 2013-03-03 VITALS — BP 130/68 | HR 66 | Ht 66.5 in | Wt 231.0 lb

## 2013-03-03 DIAGNOSIS — Z9989 Dependence on other enabling machines and devices: Secondary | ICD-10-CM

## 2013-03-03 DIAGNOSIS — I10 Essential (primary) hypertension: Secondary | ICD-10-CM | POA: Diagnosis not present

## 2013-03-03 DIAGNOSIS — E669 Obesity, unspecified: Secondary | ICD-10-CM

## 2013-03-03 DIAGNOSIS — G4733 Obstructive sleep apnea (adult) (pediatric): Secondary | ICD-10-CM

## 2013-03-03 DIAGNOSIS — I4510 Unspecified right bundle-branch block: Secondary | ICD-10-CM

## 2013-03-03 NOTE — Patient Instructions (Addendum)
Your physician recommends that you schedule a follow-up appointment in: 1 year  

## 2013-03-06 DIAGNOSIS — M2040 Other hammer toe(s) (acquired), unspecified foot: Secondary | ICD-10-CM | POA: Diagnosis not present

## 2013-03-06 DIAGNOSIS — M79609 Pain in unspecified limb: Secondary | ICD-10-CM | POA: Diagnosis not present

## 2013-03-06 DIAGNOSIS — B351 Tinea unguium: Secondary | ICD-10-CM | POA: Diagnosis not present

## 2013-03-12 ENCOUNTER — Encounter: Payer: Self-pay | Admitting: Cardiovascular Disease

## 2013-03-12 DIAGNOSIS — I10 Essential (primary) hypertension: Secondary | ICD-10-CM | POA: Insufficient documentation

## 2013-03-12 DIAGNOSIS — G4733 Obstructive sleep apnea (adult) (pediatric): Secondary | ICD-10-CM | POA: Insufficient documentation

## 2013-03-12 DIAGNOSIS — I4510 Unspecified right bundle-branch block: Secondary | ICD-10-CM | POA: Insufficient documentation

## 2013-03-12 NOTE — Progress Notes (Signed)
Patient ID: [name] [name], female   DOB: 08-22-1945, 67 y.o.   MRN: [id number]     HPI: [name] [name], is a 67 y.o. female percents in the office today in followup of her complex obstructive sleep apnea.  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 She has been able to tolerate BiPAP well but also has had difficulty with some of her high pressure requirements. I last saw her in April 2014 I changed her maximum BiPAP pressure to 19 and her maximum CPAP pressure to 15. I also reduced her minimum CPAP pressure to 6 and her minimal IPAP pressure to 10 with pressure support of 4. Also change her BIOflex to 3. She has been followed by [name] [name]. The patient states that she just had a download this week on these new settings. She seems to be able to tolerate the adjustment much better than she had in the past. She is now mostly sleeping through the night, going to bed at 10:30 and waking up at 7 AM. She denies residual sleepiness. Prior to these recent changes her AHI was elevated at 26.   Past Medical History  Diagnosis Date  . Hypertension   . Sleep apnea     bipap  . Diabetes mellitus without complication     diet controlled   . Deep vein thrombosis     right calf - 05/2012   . GERD (gastroesophageal reflux disease)   . Arthritis   . Neuropathy     diabetic - in bilateral feet   . Pityriasis lichenoides chronica   . Lymphocytic colitis     Past Surgical History  Procedure Laterality Date  . Dilation and curettage of uterus    . Torn meniscus repair       right knee   . Hammer toe surgery    . Right hand surgery       due to blood infection   . Total hip arthroplasty  09/13/2012    Procedure: TOTAL HIP ARTHROPLASTY ANTERIOR APPROACH;  Surgeon: Shelda Pal, MD;  Location: [facility];  Service: Orthopedics;  Laterality: Right;    Allergies  Allergen Reactions  . Sulfa Antibiotics Hives    Current Outpatient Prescriptions    Medication Sig Dispense Refill  . adalimumab (HUMIRA PEN) 40 MG/0.8ML injection Inject 40 mg into the skin every 14 (fourteen) days. She takes on Sunday.      Marland Kitchen allopurinol (ZYLOPRIM) 100 MG tablet Take 100 mg by mouth 2 (two) times daily.      Marland Kitchen aspirin 81 MG tablet Take 81 mg by mouth daily.      . Cholecalciferol (VITAMIN D3) 1000 UNITS CAPS Take 1,000 Units by mouth every morning.      . citalopram (CELEXA) 20 MG tablet Take 20 mg by mouth at bedtime.      Marland Kitchen FOLIC ACID PO Take 1 tablet by mouth 2 (two) times daily.      Marland Kitchen gabapentin (NEURONTIN) 600 MG tablet Take 600 mg by mouth 3 (three) times daily.      Marland Kitchen lisinopril-hydrochlorothiazide (PRINZIDE,ZESTORETIC) 20-12.5 MG per tablet Take 1 tablet by mouth every morning.      . loperamide (IMODIUM) 2 MG capsule Take 6 mg by mouth 2 (two) times daily.      . methotrexate (RHEUMATREX) 2.5 MG tablet Take by mouth once a week. Caution:Chemotherapy. Protect from light. 8 tablets, once a week      .  mometasone (ELOCON) 0.1 % cream Apply 1 application topically daily as needed. For psoriasis.      . mupirocin cream (BACTROBAN) 2 % Apply 1 application topically 2 (two) times daily as needed. For psoriasis.      Marland Kitchen omeprazole (PRILOSEC) 40 MG capsule Take 40 mg by mouth at bedtime.      Marland Kitchen PRESCRIPTION MEDICATION Place 3 drops into both ears 2 (two) times daily as needed. Fluocinolone 0.01% Ear Drops. For inflammation and itching of the ear.       No current facility-administered medications for this visit.    Socially she is widowed. She has 2 children and 2 grandchildren. Her daughter is a Development worker, community. There is no tobacco use. She does drink occasional alcohol.  ROS is negative for fevers, chills or night sweats. She denies bruxism. She denies residual daytime sleepiness. She denies chest pain. She states her blood pressure has been controlled. She denies abdominal pain. There is no significant weight loss. She denies significant edema. There are no  restless legs. She is status post hip replacement.  Other system review is negative.  PE BP 130/68  Pulse 66  Ht 5' 6.5" (1.689 m)  Wt 231 lb (104.781 kg)  BMI 36.73 kg/m2  General: Alert, oriented, no distress.  Skin: normal turgor, no rashes HEENT: Normocephalic, atraumatic. Pupils round and reactive; sclera anicteric;no lid lag.  Nose without nasal septal hypertrophy Mouth/Parynx benign; Mallinpatti scale 3 Neck: No JVD, no carotid briuts Lungs: clear to ausculatation and percussion; no wheezing or rales Heart: RRR, s1 s2 normal 1/6 systolic murmur to Abdomen: soft, nontender; no hepatosplenomehaly, BS+; abdominal aorta nontender and not dilated by palpation. Pulses 2+ Extremities: no clubbing cyanosis or edema, Homan's sign negative  Neurologic: grossly nonfocal  ECG sinus rhythm with incomplete right bundle branch block. QRS duration 118 ms. Nonspecific ST-T changes.  LABS:  BMET    Component Value Date/Time   NA 131* 09/14/2012 0350   K 4.5 09/14/2012 0350   CL 97 09/14/2012 0350   CO2 27 09/14/2012 0350   GLUCOSE 181* 09/14/2012 0350   BUN 16 09/14/2012 0350   CREATININE 0.69 09/14/2012 0350   CALCIUM 8.6 09/14/2012 0350   GFRNONAA 88* 09/14/2012 0350   GFRAA >90 09/14/2012 0350     Hepatic Function Panel  No results found for this basename: prot, albumin, ast, alt, alkphos, bilitot, bilidir, ibili     CBC    Component Value Date/Time   WBC 13.7* 09/14/2012 0350   RBC 3.21* 09/14/2012 0350   HGB 10.4* 09/14/2012 0350   HCT 29.8* 09/14/2012 0350   PLT 181 09/14/2012 0350   MCV 92.8 09/14/2012 0350   MCH 32.4 09/14/2012 0350   MCHC 34.9 09/14/2012 0350   RDW 13.6 09/14/2012 0350     BNP No results found for this basename: probnp    Lipid Panel  No results found for this basename: chol, trig, hdl, cholhdl, vldl, ldlcalc     RADIOLOGY: No results found.    ASSESSMENT AND PLAN: [name] [name] feels that the recent changes that were made with her BiPAP unit had been very beneficial.  She is sleeping significantly improved. Previously she not tolerate a very higher pressure in her eye pad maxilla was reduced from 25 cm water pressure. She now has time to fall to sleep with her lower keypad pressure. We did discuss potential for use of ramp time he does wake up so that she will then be able to fall back to  sleep with her reinitiation of low CPAP pressure appearing she is tolerating a pressure support of 4. I will try to taper down both recently obtained at [facility] and as long as she is doing well I'll see her in 6 months for evaluation.     Lennette Bihari, MD, [facility]  03/12/2013 11:42 AM

## 2013-03-14 DIAGNOSIS — M5126 Other intervertebral disc displacement, lumbar region: Secondary | ICD-10-CM | POA: Diagnosis not present

## 2013-03-14 DIAGNOSIS — M48061 Spinal stenosis, lumbar region without neurogenic claudication: Secondary | ICD-10-CM | POA: Diagnosis not present

## 2013-03-14 DIAGNOSIS — M5137 Other intervertebral disc degeneration, lumbosacral region: Secondary | ICD-10-CM | POA: Diagnosis not present

## 2013-03-15 ENCOUNTER — Encounter: Payer: Self-pay | Admitting: Cardiovascular Disease

## 2013-03-29 DIAGNOSIS — M5126 Other intervertebral disc displacement, lumbar region: Secondary | ICD-10-CM | POA: Diagnosis not present

## 2013-04-06 DIAGNOSIS — M5126 Other intervertebral disc displacement, lumbar region: Secondary | ICD-10-CM | POA: Diagnosis not present

## 2013-04-21 DIAGNOSIS — M109 Gout, unspecified: Secondary | ICD-10-CM | POA: Diagnosis not present

## 2013-04-21 DIAGNOSIS — L408 Other psoriasis: Secondary | ICD-10-CM | POA: Diagnosis not present

## 2013-04-21 DIAGNOSIS — M1990 Unspecified osteoarthritis, unspecified site: Secondary | ICD-10-CM | POA: Diagnosis not present

## 2013-04-21 DIAGNOSIS — L4050 Arthropathic psoriasis, unspecified: Secondary | ICD-10-CM | POA: Diagnosis not present

## 2013-05-29 ENCOUNTER — Ambulatory Visit: Payer: Medicare Other | Admitting: Podiatry

## 2013-06-06 DIAGNOSIS — H6120 Impacted cerumen, unspecified ear: Secondary | ICD-10-CM | POA: Diagnosis not present

## 2013-06-06 DIAGNOSIS — J019 Acute sinusitis, unspecified: Secondary | ICD-10-CM | POA: Diagnosis not present

## 2013-06-06 DIAGNOSIS — Z23 Encounter for immunization: Secondary | ICD-10-CM | POA: Diagnosis not present

## 2013-06-14 DIAGNOSIS — M5126 Other intervertebral disc displacement, lumbar region: Secondary | ICD-10-CM | POA: Diagnosis not present

## 2013-06-22 ENCOUNTER — Ambulatory Visit: Payer: Medicare Other | Admitting: Podiatry

## 2013-06-22 DIAGNOSIS — M25539 Pain in unspecified wrist: Secondary | ICD-10-CM | POA: Diagnosis not present

## 2013-06-22 DIAGNOSIS — S52123A Displaced fracture of head of unspecified radius, initial encounter for closed fracture: Secondary | ICD-10-CM | POA: Diagnosis not present

## 2013-06-26 DIAGNOSIS — H43819 Vitreous degeneration, unspecified eye: Secondary | ICD-10-CM | POA: Diagnosis not present

## 2013-06-26 DIAGNOSIS — H259 Unspecified age-related cataract: Secondary | ICD-10-CM | POA: Diagnosis not present

## 2013-06-26 DIAGNOSIS — H52209 Unspecified astigmatism, unspecified eye: Secondary | ICD-10-CM | POA: Diagnosis not present

## 2013-06-26 DIAGNOSIS — H5210 Myopia, unspecified eye: Secondary | ICD-10-CM | POA: Diagnosis not present

## 2013-06-29 ENCOUNTER — Ambulatory Visit (INDEPENDENT_AMBULATORY_CARE_PROVIDER_SITE_OTHER): Payer: Medicare Other | Admitting: Podiatry

## 2013-06-29 ENCOUNTER — Encounter: Payer: Self-pay | Admitting: Podiatry

## 2013-06-29 VITALS — BP 109/61 | HR 73 | Resp 16 | Ht 67.0 in | Wt 228.0 lb

## 2013-06-29 DIAGNOSIS — B351 Tinea unguium: Secondary | ICD-10-CM | POA: Diagnosis not present

## 2013-06-29 DIAGNOSIS — M79609 Pain in unspecified limb: Secondary | ICD-10-CM | POA: Diagnosis not present

## 2013-06-29 NOTE — Progress Notes (Signed)
  Subjective:    Patient ID: [name] [name], female    DOB: 13-Jan-1946, 67 y.o.   MRN: [id number]  HPI N Debridement        L B/l 1 - 5 toenails         D and O long-term        C elongated toenails        A diabetes and difficult to cut        T hx of periodic diabetic foot care    Review of Systems  Constitutional: Negative.   HENT: Negative.   Eyes: Positive for visual disturbance.       Hx of Right Cataract surgery  Respiratory: Negative.   Cardiovascular: Negative.   Gastrointestinal: Negative.   Endocrine: Negative.   Genitourinary: Negative.   Musculoskeletal:       Fracture of right arm on 06/14/2013, set on 06/21/2013.  Skin:       psoriasis  Allergic/Immunologic: Positive for immunocompromised state.       Pt takes Humira, can cause immunity problems  Neurological: Negative.   Hematological: Negative.   Psychiatric/Behavioral: Negative.        Objective:   Physical Exam        Assessment & Plan:

## 2013-06-30 NOTE — Progress Notes (Signed)
Subjective:     Patient ID: [name] [name], female   DOB: 06/13/1946, 67 y.o.   MRN: [id number]  HPI patient states I have a very thick toenails 1-5 both feet that I cannot reach myself and I broke my arm making it impossible for me to cut my own toenails   Review of Systems     Objective:   Physical Exam    neurovascular status unchanged with thick incurvated nailbeds that are painful 1-5 both feet Assessment:     Mycotic nails with pain 1-5 both feet    Plan:     Debridement nailbeds 1-5 both feet with no iatrogenic bleeding noted

## 2013-07-12 DIAGNOSIS — L408 Other psoriasis: Secondary | ICD-10-CM | POA: Diagnosis not present

## 2013-07-12 DIAGNOSIS — L819 Disorder of pigmentation, unspecified: Secondary | ICD-10-CM | POA: Diagnosis not present

## 2013-07-12 DIAGNOSIS — D239 Other benign neoplasm of skin, unspecified: Secondary | ICD-10-CM | POA: Diagnosis not present

## 2013-07-12 DIAGNOSIS — D1801 Hemangioma of skin and subcutaneous tissue: Secondary | ICD-10-CM | POA: Diagnosis not present

## 2013-07-12 DIAGNOSIS — L821 Other seborrheic keratosis: Secondary | ICD-10-CM | POA: Diagnosis not present

## 2013-07-13 DIAGNOSIS — S5010XA Contusion of unspecified forearm, initial encounter: Secondary | ICD-10-CM | POA: Diagnosis not present

## 2013-07-13 DIAGNOSIS — S52123A Displaced fracture of head of unspecified radius, initial encounter for closed fracture: Secondary | ICD-10-CM | POA: Diagnosis not present

## 2013-07-13 DIAGNOSIS — M25629 Stiffness of unspecified elbow, not elsewhere classified: Secondary | ICD-10-CM | POA: Diagnosis not present

## 2013-07-20 DIAGNOSIS — L4050 Arthropathic psoriasis, unspecified: Secondary | ICD-10-CM | POA: Diagnosis not present

## 2013-07-20 DIAGNOSIS — L408 Other psoriasis: Secondary | ICD-10-CM | POA: Diagnosis not present

## 2013-07-20 DIAGNOSIS — M1990 Unspecified osteoarthritis, unspecified site: Secondary | ICD-10-CM | POA: Diagnosis not present

## 2013-07-20 DIAGNOSIS — M109 Gout, unspecified: Secondary | ICD-10-CM | POA: Diagnosis not present

## 2013-07-24 DIAGNOSIS — IMO0002 Reserved for concepts with insufficient information to code with codable children: Secondary | ICD-10-CM | POA: Diagnosis not present

## 2013-07-24 DIAGNOSIS — Z79899 Other long term (current) drug therapy: Secondary | ICD-10-CM | POA: Diagnosis not present

## 2013-07-24 DIAGNOSIS — Y92009 Unspecified place in unspecified non-institutional (private) residence as the place of occurrence of the external cause: Secondary | ICD-10-CM | POA: Diagnosis not present

## 2013-08-01 DIAGNOSIS — S52123A Displaced fracture of head of unspecified radius, initial encounter for closed fracture: Secondary | ICD-10-CM | POA: Diagnosis not present

## 2013-08-07 DIAGNOSIS — M25539 Pain in unspecified wrist: Secondary | ICD-10-CM | POA: Diagnosis not present

## 2013-08-15 DIAGNOSIS — H25039 Anterior subcapsular polar age-related cataract, unspecified eye: Secondary | ICD-10-CM | POA: Diagnosis not present

## 2013-08-15 DIAGNOSIS — H269 Unspecified cataract: Secondary | ICD-10-CM | POA: Diagnosis not present

## 2013-08-15 DIAGNOSIS — H2510 Age-related nuclear cataract, unspecified eye: Secondary | ICD-10-CM | POA: Diagnosis not present

## 2013-08-15 DIAGNOSIS — H52209 Unspecified astigmatism, unspecified eye: Secondary | ICD-10-CM | POA: Diagnosis not present

## 2013-08-15 DIAGNOSIS — H25049 Posterior subcapsular polar age-related cataract, unspecified eye: Secondary | ICD-10-CM | POA: Diagnosis not present

## 2013-08-17 DIAGNOSIS — M25539 Pain in unspecified wrist: Secondary | ICD-10-CM | POA: Diagnosis not present

## 2013-08-21 DIAGNOSIS — M25539 Pain in unspecified wrist: Secondary | ICD-10-CM | POA: Diagnosis not present

## 2013-08-23 DIAGNOSIS — I10 Essential (primary) hypertension: Secondary | ICD-10-CM | POA: Diagnosis not present

## 2013-08-23 DIAGNOSIS — M109 Gout, unspecified: Secondary | ICD-10-CM | POA: Diagnosis not present

## 2013-08-23 DIAGNOSIS — E559 Vitamin D deficiency, unspecified: Secondary | ICD-10-CM | POA: Diagnosis not present

## 2013-08-23 DIAGNOSIS — L4050 Arthropathic psoriasis, unspecified: Secondary | ICD-10-CM | POA: Diagnosis not present

## 2013-08-23 DIAGNOSIS — E119 Type 2 diabetes mellitus without complications: Secondary | ICD-10-CM | POA: Diagnosis not present

## 2013-08-30 DIAGNOSIS — E785 Hyperlipidemia, unspecified: Secondary | ICD-10-CM | POA: Diagnosis not present

## 2013-08-30 DIAGNOSIS — N182 Chronic kidney disease, stage 2 (mild): Secondary | ICD-10-CM | POA: Diagnosis not present

## 2013-08-30 DIAGNOSIS — I10 Essential (primary) hypertension: Secondary | ICD-10-CM | POA: Diagnosis not present

## 2013-08-30 DIAGNOSIS — E1129 Type 2 diabetes mellitus with other diabetic kidney complication: Secondary | ICD-10-CM | POA: Diagnosis not present

## 2013-09-14 DIAGNOSIS — M25539 Pain in unspecified wrist: Secondary | ICD-10-CM | POA: Diagnosis not present

## 2013-09-14 DIAGNOSIS — S52123A Displaced fracture of head of unspecified radius, initial encounter for closed fracture: Secondary | ICD-10-CM | POA: Diagnosis not present

## 2013-09-14 DIAGNOSIS — M25629 Stiffness of unspecified elbow, not elsewhere classified: Secondary | ICD-10-CM | POA: Diagnosis not present

## 2013-09-15 DIAGNOSIS — Z96649 Presence of unspecified artificial hip joint: Secondary | ICD-10-CM | POA: Diagnosis not present

## 2013-09-15 DIAGNOSIS — M25559 Pain in unspecified hip: Secondary | ICD-10-CM | POA: Diagnosis not present

## 2013-09-15 DIAGNOSIS — M1710 Unilateral primary osteoarthritis, unspecified knee: Secondary | ICD-10-CM | POA: Diagnosis not present

## 2013-09-15 DIAGNOSIS — M25569 Pain in unspecified knee: Secondary | ICD-10-CM | POA: Diagnosis not present

## 2013-09-18 ENCOUNTER — Ambulatory Visit (INDEPENDENT_AMBULATORY_CARE_PROVIDER_SITE_OTHER): Payer: Medicare Other | Admitting: Podiatry

## 2013-09-18 VITALS — BP 118/67 | HR 75 | Resp 16 | Ht 67.0 in | Wt 228.0 lb

## 2013-09-18 DIAGNOSIS — E1159 Type 2 diabetes mellitus with other circulatory complications: Secondary | ICD-10-CM | POA: Diagnosis not present

## 2013-09-18 DIAGNOSIS — M79609 Pain in unspecified limb: Secondary | ICD-10-CM | POA: Diagnosis not present

## 2013-09-18 DIAGNOSIS — Q828 Other specified congenital malformations of skin: Secondary | ICD-10-CM | POA: Diagnosis not present

## 2013-09-18 DIAGNOSIS — B351 Tinea unguium: Secondary | ICD-10-CM | POA: Diagnosis not present

## 2013-09-19 NOTE — Progress Notes (Signed)
Subjective:     Patient ID: GENIE WENKE, female   DOB: 30-Mar-1946, 68 y.o.   MRN: [id number]  HPI patient is found to have nail disease 1-5 both feet that are thick and painful keratotic lesion sub-left hallex and sub-right foot that is painful when pressed with localized irritation of the left hallux  Review of Systems     Objective:   Physical Exam Neurovascular status intact with thick nail disease that are painful 1-5 both feet and keratotic lesion sub-left hallux that did have a small blister on it but not active currently and keratotic lesion sub-right foot    Assessment:     Mycotic nail infection with pain 1-5 both feet and keratotic lesion sub-left hallux and sub-right foot with discomfort    Plan:     Debridement of painful nail bed 1-5 both feet and debridement of lesions on both feet with no iatrogenic bleeding or opening noted

## 2013-09-28 ENCOUNTER — Ambulatory Visit: Payer: Medicare Other | Admitting: Podiatry

## 2013-10-09 DIAGNOSIS — M48061 Spinal stenosis, lumbar region without neurogenic claudication: Secondary | ICD-10-CM | POA: Diagnosis not present

## 2013-10-23 DIAGNOSIS — M48061 Spinal stenosis, lumbar region without neurogenic claudication: Secondary | ICD-10-CM | POA: Diagnosis not present

## 2013-10-30 DIAGNOSIS — M48061 Spinal stenosis, lumbar region without neurogenic claudication: Secondary | ICD-10-CM | POA: Diagnosis not present

## 2013-10-30 DIAGNOSIS — M545 Low back pain, unspecified: Secondary | ICD-10-CM | POA: Diagnosis not present

## 2013-10-30 DIAGNOSIS — M5137 Other intervertebral disc degeneration, lumbosacral region: Secondary | ICD-10-CM | POA: Diagnosis not present

## 2013-11-16 DIAGNOSIS — M109 Gout, unspecified: Secondary | ICD-10-CM | POA: Diagnosis not present

## 2013-11-16 DIAGNOSIS — L4050 Arthropathic psoriasis, unspecified: Secondary | ICD-10-CM | POA: Diagnosis not present

## 2013-11-16 DIAGNOSIS — L408 Other psoriasis: Secondary | ICD-10-CM | POA: Diagnosis not present

## 2013-11-16 DIAGNOSIS — M1990 Unspecified osteoarthritis, unspecified site: Secondary | ICD-10-CM | POA: Diagnosis not present

## 2013-12-14 DIAGNOSIS — M25569 Pain in unspecified knee: Secondary | ICD-10-CM | POA: Diagnosis not present

## 2013-12-14 DIAGNOSIS — M129 Arthropathy, unspecified: Secondary | ICD-10-CM | POA: Diagnosis not present

## 2013-12-18 ENCOUNTER — Ambulatory Visit (INDEPENDENT_AMBULATORY_CARE_PROVIDER_SITE_OTHER): Payer: Medicare Other | Admitting: Podiatry

## 2013-12-18 ENCOUNTER — Encounter: Payer: Self-pay | Admitting: Podiatry

## 2013-12-18 VITALS — BP 109/50 | HR 95 | Resp 16

## 2013-12-18 DIAGNOSIS — B351 Tinea unguium: Secondary | ICD-10-CM | POA: Diagnosis not present

## 2013-12-18 DIAGNOSIS — L84 Corns and callosities: Secondary | ICD-10-CM

## 2013-12-18 DIAGNOSIS — M79609 Pain in unspecified limb: Secondary | ICD-10-CM | POA: Diagnosis not present

## 2013-12-18 NOTE — Patient Instructions (Signed)
Diabetes and Foot Care Diabetes may cause you to have problems because of poor blood supply (circulation) to your feet and legs. This may cause the skin on your feet to become thinner, break easier, and heal more slowly. Your skin may become dry, and the skin may peel and crack. You may also have nerve damage in your legs and feet causing decreased feeling in them. You may not notice minor injuries to your feet that could lead to infections or more serious problems. Taking care of your feet is one of the most important things you can do for yourself.  HOME CARE INSTRUCTIONS  Wear shoes at all times, even in the house. Do not go barefoot. Bare feet are easily injured.  Check your feet daily for blisters, cuts, and redness. If you cannot see the bottom of your feet, use a mirror or ask someone for help.  Wash your feet with warm water (do not use hot water) and mild soap. Then pat your feet and the areas between your toes until they are completely dry. Do not soak your feet as this can dry your skin.  Apply a moisturizing lotion or petroleum jelly (that does not contain alcohol and is unscented) to the skin on your feet and to dry, brittle toenails. Do not apply lotion between your toes.  Trim your toenails straight across. Do not dig under them or around the cuticle. File the edges of your nails with an emery board or nail file.  Do not cut corns or calluses or try to remove them with medicine.  Wear clean socks or stockings every day. Make sure they are not too tight. Do not wear knee-high stockings since they may decrease blood flow to your legs.  Wear shoes that fit properly and have enough cushioning. To break in new shoes, wear them for just a few hours a day. This prevents you from injuring your feet. Always look in your shoes before you put them on to be sure there are no objects inside.  Do not cross your legs. This may decrease the blood flow to your feet.  If you find a minor scrape,  cut, or break in the skin on your feet, keep it and the skin around it clean and dry. These areas may be cleansed with mild soap and water. Do not cleanse the area with peroxide, alcohol, or iodine.  When you remove an adhesive bandage, be sure not to damage the skin around it.  If you have a wound, look at it several times a day to make sure it is healing.  Do not use heating pads or hot water bottles. They may burn your skin. If you have lost feeling in your feet or legs, you may not know it is happening until it is too late.  Make sure your health care provider performs a complete foot exam at least annually or more often if you have foot problems. Report any cuts, sores, or bruises to your health care provider immediately. SEEK MEDICAL CARE IF:   You have an injury that is not healing.  You have cuts or breaks in the skin.  You have an ingrown nail.  You notice redness on your legs or feet.  You feel burning or tingling in your legs or feet.  You have pain or cramps in your legs and feet.  Your legs or feet are numb.  Your feet always feel cold. SEEK IMMEDIATE MEDICAL CARE IF:   There is increasing redness,   swelling, or pain in or around a wound.  There is a red line that goes up your leg.  Pus is coming from a wound.  You develop a fever or as directed by your health care provider.  You notice a bad smell coming from an ulcer or wound. Document Released: 07/24/2000 Document Revised: 03/29/2013 Document Reviewed: 01/03/2013 [employer] Patient Information 2014 [employer].  

## 2013-12-18 NOTE — Progress Notes (Signed)
Subjective:     Patient ID: [name] [name], female   DOB: 1946-05-12, 68 y.o.   MRN: [id number]  HPI patient presents with thick yellow nailbeds 1-5 both feet that are painful keratotic lesion first metatarsal both feet which is painful and she cannot take   Review of Systems     Objective:   Physical Exam Neurovascular status unchanged with thick nailbeds 1-5 both feet that are painful when pressed and keratotic lesion side of first metatarsal both feet that are sore    Assessment:     Mycotic nail infection with pain 1-5 both feet keratotic lesion on both feet    Plan:     Debridement painful nail bed 1-5 both feet and debridement lesions on both feet with no iatrogenic bleeding noted

## 2014-03-01 DIAGNOSIS — M1990 Unspecified osteoarthritis, unspecified site: Secondary | ICD-10-CM | POA: Diagnosis not present

## 2014-03-01 DIAGNOSIS — L408 Other psoriasis: Secondary | ICD-10-CM | POA: Diagnosis not present

## 2014-03-01 DIAGNOSIS — M109 Gout, unspecified: Secondary | ICD-10-CM | POA: Diagnosis not present

## 2014-03-01 DIAGNOSIS — L4050 Arthropathic psoriasis, unspecified: Secondary | ICD-10-CM | POA: Diagnosis not present

## 2014-03-02 ENCOUNTER — Telehealth: Payer: Self-pay | Admitting: Cardiovascular Disease

## 2014-03-02 NOTE — Telephone Encounter (Signed)
Returned a call to patient at the number provided. Had to leave a message.

## 2014-03-02 NOTE — Telephone Encounter (Signed)
Wants to speak to Mariann Laster - would not give any information other than she needs to talk to wanda about her treatment plan with dr. Claiborne Billings.

## 2014-03-02 NOTE — Telephone Encounter (Signed)
Returning your call. °

## 2014-03-05 NOTE — Telephone Encounter (Signed)
Returning your call. °

## 2014-03-05 NOTE — Telephone Encounter (Signed)
Returned a call to patient. She called to inform me that she has been on a "wait list" for six weeks to see Dr. Claiborne Billings. She was wondering if he has had any cancellations. Informed her that part of the problem is that he has only been in the office a limited amount of time the entire month of July. I will be  On the look out for her if he does get a cancellation. Patient voiced understanding.

## 2014-03-19 ENCOUNTER — Ambulatory Visit (INDEPENDENT_AMBULATORY_CARE_PROVIDER_SITE_OTHER): Payer: Medicare Other | Admitting: Podiatry

## 2014-03-19 DIAGNOSIS — E1159 Type 2 diabetes mellitus with other circulatory complications: Secondary | ICD-10-CM

## 2014-03-19 DIAGNOSIS — Q828 Other specified congenital malformations of skin: Secondary | ICD-10-CM

## 2014-03-19 DIAGNOSIS — B351 Tinea unguium: Secondary | ICD-10-CM

## 2014-03-19 DIAGNOSIS — M79673 Pain in unspecified foot: Secondary | ICD-10-CM

## 2014-03-19 DIAGNOSIS — M79609 Pain in unspecified limb: Secondary | ICD-10-CM

## 2014-03-19 NOTE — Progress Notes (Signed)
Subjective:     Patient ID: [name] [name], female   DOB: 1946-06-07, 68 y.o.   MRN: [id number]  HPI patient presents with thick yellow painful nails 1-5 both feet and lesions under both feet which become painful and she's unable to take care of herself and does have long-term diabetes   Review of Systems     Objective:   Physical Exam Neurovascular status diminished but unchanged over previous visit with thick nailbeds 1-5 both feet that are painful and brittle and also keratotic lesions plantar aspect fifth metatarsal of both feet that are painful along with diminished circulatory status both DP PT pulses and diminished hair growth noted    Assessment:     At risk diabetic with mycotic nail disease and pain and lesion formation with pain    Plan:     Debris painful nailbeds 1-5 both feet and lesions on both feet with no iatrogenic bleeding noted

## 2014-03-20 DIAGNOSIS — Z0000 Encounter for general adult medical examination without abnormal findings: Secondary | ICD-10-CM | POA: Diagnosis not present

## 2014-03-20 DIAGNOSIS — M545 Low back pain, unspecified: Secondary | ICD-10-CM | POA: Diagnosis not present

## 2014-03-20 DIAGNOSIS — I10 Essential (primary) hypertension: Secondary | ICD-10-CM | POA: Diagnosis not present

## 2014-03-20 DIAGNOSIS — M1710 Unilateral primary osteoarthritis, unspecified knee: Secondary | ICD-10-CM | POA: Diagnosis not present

## 2014-03-20 DIAGNOSIS — M25569 Pain in unspecified knee: Secondary | ICD-10-CM | POA: Diagnosis not present

## 2014-03-20 DIAGNOSIS — E1129 Type 2 diabetes mellitus with other diabetic kidney complication: Secondary | ICD-10-CM | POA: Diagnosis not present

## 2014-03-20 DIAGNOSIS — E559 Vitamin D deficiency, unspecified: Secondary | ICD-10-CM | POA: Diagnosis not present

## 2014-03-20 DIAGNOSIS — E119 Type 2 diabetes mellitus without complications: Secondary | ICD-10-CM | POA: Diagnosis not present

## 2014-03-20 DIAGNOSIS — L4050 Arthropathic psoriasis, unspecified: Secondary | ICD-10-CM | POA: Diagnosis not present

## 2014-03-20 DIAGNOSIS — M109 Gout, unspecified: Secondary | ICD-10-CM | POA: Diagnosis not present

## 2014-04-05 DIAGNOSIS — E119 Type 2 diabetes mellitus without complications: Secondary | ICD-10-CM | POA: Diagnosis not present

## 2014-04-05 DIAGNOSIS — E785 Hyperlipidemia, unspecified: Secondary | ICD-10-CM | POA: Diagnosis not present

## 2014-04-05 DIAGNOSIS — M109 Gout, unspecified: Secondary | ICD-10-CM | POA: Diagnosis not present

## 2014-04-05 DIAGNOSIS — I10 Essential (primary) hypertension: Secondary | ICD-10-CM | POA: Diagnosis not present

## 2014-04-13 DIAGNOSIS — G609 Hereditary and idiopathic neuropathy, unspecified: Secondary | ICD-10-CM | POA: Diagnosis not present

## 2014-04-13 DIAGNOSIS — M2140 Flat foot [pes planus] (acquired), unspecified foot: Secondary | ICD-10-CM | POA: Diagnosis not present

## 2014-04-13 DIAGNOSIS — M25579 Pain in unspecified ankle and joints of unspecified foot: Secondary | ICD-10-CM | POA: Diagnosis not present

## 2014-04-23 DIAGNOSIS — E1142 Type 2 diabetes mellitus with diabetic polyneuropathy: Secondary | ICD-10-CM | POA: Diagnosis not present

## 2014-04-23 DIAGNOSIS — E1149 Type 2 diabetes mellitus with other diabetic neurological complication: Secondary | ICD-10-CM | POA: Diagnosis not present

## 2014-05-18 ENCOUNTER — Encounter: Payer: Self-pay | Admitting: Cardiovascular Disease

## 2014-05-18 ENCOUNTER — Ambulatory Visit (INDEPENDENT_AMBULATORY_CARE_PROVIDER_SITE_OTHER): Payer: Medicare Other | Admitting: Cardiovascular Disease

## 2014-05-18 VITALS — BP 112/72 | HR 71 | Ht 66.5 in | Wt 235.8 lb

## 2014-05-18 DIAGNOSIS — Z23 Encounter for immunization: Secondary | ICD-10-CM

## 2014-05-18 DIAGNOSIS — I10 Essential (primary) hypertension: Secondary | ICD-10-CM | POA: Diagnosis not present

## 2014-05-18 DIAGNOSIS — Z9989 Dependence on other enabling machines and devices: Secondary | ICD-10-CM

## 2014-05-18 DIAGNOSIS — E669 Obesity, unspecified: Secondary | ICD-10-CM

## 2014-05-18 DIAGNOSIS — G4733 Obstructive sleep apnea (adult) (pediatric): Secondary | ICD-10-CM

## 2014-05-18 DIAGNOSIS — I4510 Unspecified right bundle-branch block: Secondary | ICD-10-CM | POA: Diagnosis not present

## 2014-05-18 NOTE — Progress Notes (Signed)
Patient ID: [name] [name], female   DOB: Sep 14, 1945, 68 y.o.   MRN: [id number]     HPI: [name] [name] is a 68 y.o. female percents in the office today in followup cardiology evaluation and her complex obstructive sleep apnea  [name] [name] is a retired [employer]  who has a history of hypertension, obesity, psoriatic arthritis, DVT, which occurred while traveling to [place], as well as complex obstructive sleep apnea.  She has had difficulty in the past with CPAP therapy. She has been able to tolerate BiPAP well but also has had difficulty with some of her high pressure requirements. I last saw her in April 2014 I changed her maximum BiPAP pressure to 19 and her maximum CPAP pressure to 15. I also reduced her minimum CPAP pressure to 6 and her minimal IPAP pressure to 10 with pressure support of 4 and changed her Bi_Flex to 3. She has been followed by [facility].  She has not had a recent download and was wondering if she needed an increase in her upper pressure setting.  She has been sleeping fairly well and sleeps much better than she had when she was on CPAP rather than her current BiPAP unit.  She does have psoriatic arthritis and is on Humira as well as methotrexate followed by rheumatology.  She also has a Charcot joint in her right foot.  She does not routinely exercise.  She keeps busy moving around, but typically does not do aerobic activity.  She has not been successful with significant weight loss.  She states her blood pressure has been controlled on lisinopril HCT 20/12.5.  She has GERD, which is controlled with omeprazole.  She presents for yearly evaluation.    Past Medical History  Diagnosis Date  . Sleep apnea     bipap  . Diabetes mellitus without complication     diet controlled   . Deep vein thrombosis     right calf - 05/2012   . GERD (gastroesophageal reflux disease)   . Arthritis   . Neuropathy     diabetic - in bilateral feet   . Pityriasis lichenoides chronica   .  Lymphocytic colitis   . Hyperlipidemia   . Hypertension     Ejection fraction =>55% Left ventricular systolic function is normal. Left ventricular wall motion is normal      Past Surgical History  Procedure Laterality Date  . Dilation and curettage of uterus    . Torn meniscus repair       right knee   . Hammer toe surgery    . Right hand surgery       due to blood infection   . Total hip arthroplasty  09/13/2012    Procedure: TOTAL HIP ARTHROPLASTY ANTERIOR APPROACH;  Surgeon: Mauri Pole, MD;  Location: [facility];  Service: Orthopedics;  Laterality: Right;    Allergies  Allergen Reactions  . Sulfa Antibiotics Hives    Current Outpatient Prescriptions  Medication Sig Dispense Refill  . adalimumab (HUMIRA PEN) 40 MG/0.8ML injection Inject 40 mg into the skin every 14 (fourteen) days. She takes on Sunday.      Marland Kitchen allopurinol (ZYLOPRIM) 100 MG tablet Take 100 mg by mouth 2 (two) times daily.      Marland Kitchen aspirin 81 MG tablet Take 81 mg by mouth daily.      . Cholecalciferol (VITAMIN D3) 1000 UNITS CAPS Take 1,000 Units by mouth every morning.      . Cyanocobalamin (B-12 SL) Place 1  tablet under the tongue daily.      . DULoxetine (CYMBALTA) 30 MG capsule Take 30 mg by mouth daily.      Marland Kitchen FOLIC ACID PO Take 1 tablet by mouth 2 (two) times daily.      Marland Kitchen lisinopril-hydrochlorothiazide (PRINZIDE,ZESTORETIC) 20-12.5 MG per tablet Take 1 tablet by mouth every morning.      . loperamide (IMODIUM) 2 MG capsule Take 6 mg by mouth 2 (two) times daily.      Marland Kitchen MELATONIN SL Place 5 capsules under the tongue at bedtime.      . methotrexate (RHEUMATREX) 2.5 MG tablet Take by mouth once a week. Caution:Chemotherapy. Protect from light. 8 tablets, once a week      . mometasone (ELOCON) 0.1 % cream Apply 1 application topically daily as needed. For psoriasis.      . mupirocin cream (BACTROBAN) 2 % Apply 1 application topically 2 (two) times daily as needed. For psoriasis.      Marland Kitchen omeprazole (PRILOSEC) 40 MG  capsule Take 40 mg by mouth at bedtime.      Marland Kitchen PRESCRIPTION MEDICATION Place 3 drops into both ears 2 (two) times daily as needed. Fluocinolone 0.01% Ear Drops. For inflammation and itching of the ear.       No current facility-administered medications for this visit.    Socially she is widowed. She has 2 children and 2 grandchildren. Her daughter is a Engineer, drilling. There is no tobacco use. She does drink occasional alcohol.  ROS General: Negative; No fevers, chills, or night sweats;  HEENT: Negative; No changes in vision or hearing, sinus congestion, difficulty swallowing Pulmonary: Negative; No cough, wheezing, shortness of breath, hemoptysis Cardiovascular: Negative; No chest pain, presyncope, syncope, palpitations GI: Negative; No nausea, vomiting, diarrhea, or abdominal pain GU: Negative; No dysuria, hematuria, or difficulty voiding Musculoskeletal: Positive forR foot charcot joint; no myalgias, joint pain, or weakness Hematologic/Oncology: Negative; no easy bruising, bleeding Rheumatologic: Psoriatic arthritis Endocrine: Negative; no heat/cold intolerance; no diabetes Neuro: Negative; no changes in balance, headaches Skin: Negative; No rashes or skin lesions Psychiatric: Negative; No behavioral problems, depression Sleep: Positive for complex sleep apnea on BiPAP therapy;  No residual snoring, daytime sleepiness, hypersomnolence, bruxism, restless legs, hypnogognic hallucinations, no cataplexy Other comprehensive 14 point system review is negative.  PE BP 112/72  Pulse 71  Ht 5' 6.5" (1.689 m)  Wt 235 lb 12.8 oz (106.958 kg)  BMI 37.49 kg/m2  General: Alert, oriented, no distress.  Skin: normal turgor, no rashes HEENT: Normocephalic, atraumatic. Pupils round and reactive; sclera anicteric;no lid lag.  Nose without nasal septal hypertrophy Mouth/Parynx benign; Mallinpatti scale 3 Neck: No JVD, no carotid bruits with normal carotid upstroke Lungs: clear to ausculatation and  percussion; no wheezing or rales Chest wall: Nontender to palpation Heart: RRR, s1 s2 normal 1/6 systolic murmur to Abdomen: soft, nontender; no hepatosplenomehaly, BS+; abdominal aorta nontender and not dilated by palpation. Back: No CVA tenderness Pulses 2+ Extremities: no clubbing cyanosis or edema, Homan's sign negative  Neurologic: grossly nonfocal Psychological: Normal affect and mood  ECG (independently read by me): Normal sinus rhythm at 71 beats per minute right bundle branch block with repolarization changes.  Normal intervals.  Prior July 2014 ECG sinus rhythm with incomplete right bundle branch block. QRS duration 118 ms. Nonspecific ST-T changes.  LABS:  BMET    Component Value Date/Time   NA 131* 09/14/2012 0350   K 4.5 09/14/2012 0350   CL 97 09/14/2012 0350   CO2 27  09/14/2012 0350   GLUCOSE 181* 09/14/2012 0350   BUN 16 09/14/2012 0350   CREATININE 0.69 09/14/2012 0350   CALCIUM 8.6 09/14/2012 0350   GFRNONAA 88* 09/14/2012 0350   GFRAA >90 09/14/2012 0350     Hepatic Function Panel  No results found for this basename: prot,  albumin,  ast,  alt,  alkphos,  bilitot,  bilidir,  ibili     CBC    Component Value Date/Time   WBC 13.7* 09/14/2012 0350   RBC 3.21* 09/14/2012 0350   HGB 10.4* 09/14/2012 0350   HCT 29.8* 09/14/2012 0350   PLT 181 09/14/2012 0350   MCV 92.8 09/14/2012 0350   MCH 32.4 09/14/2012 0350   MCHC 34.9 09/14/2012 0350   RDW 13.6 09/14/2012 0350     BNP No results found for this basename: probnp    Lipid Panel  No results found for this basename: chol,  trig,  hdl,  cholhdl,  vldl,  ldlcalc     RADIOLOGY: No results found.    ASSESSMENT AND PLAN: Ms. Ethier has a history of hypertension, obesity, psoriatic arthritis, complex, sleep apnea, as well as a remote history of DVT.  Presently, her blood pressure is well controlled on lisinopril HCT 20/12.5.  She has not been successful with weight loss over the past year and weight today is 4 pounds more.  Her body  mass index is 37.5, compatible with moderate obesity.  We did discuss the importance of trying to do aerobic exercise at least 4-5 days per week.  She's not had any further episodes of DVT.  Her initial episode occurred after a long flight to [place].  She does have complex sleep apnea.  She has a BiPAP unit.  I am recommending a download be obtained.  If she does require higher pressure it may be possible to switch this to a BiPAP Auto mode which can  Provide a maximum potential pressure of 25 cm water.  She is followed by rheumatology for her psoriatic arthritis and is tolerating methotrexate and Humira with frequent laboratory.  I will wait to obtain a download from [facility] to see if additional changes to her BiPAP mode are necessary.  If she remains stable, I will see her in one year for followup evaluation or sooner if problems arise.    Troy Sine, MD, [facility]  05/18/2014 8:44 AM

## 2014-05-18 NOTE — Patient Instructions (Addendum)
Your physician wants you to follow-up in: 1 year or sooner if needed.You will receive a reminder letter in the mail two months in advance. If you don't receive a letter, please call our office to schedule the follow-up appointment.  No changes have been made in your therapy today.

## 2014-05-20 DIAGNOSIS — I4510 Unspecified right bundle-branch block: Secondary | ICD-10-CM | POA: Insufficient documentation

## 2014-06-04 DIAGNOSIS — K529 Noninfective gastroenteritis and colitis, unspecified: Secondary | ICD-10-CM | POA: Diagnosis not present

## 2014-06-04 DIAGNOSIS — L4050 Arthropathic psoriasis, unspecified: Secondary | ICD-10-CM | POA: Diagnosis not present

## 2014-06-04 DIAGNOSIS — L408 Other psoriasis: Secondary | ICD-10-CM | POA: Diagnosis not present

## 2014-06-04 DIAGNOSIS — M109 Gout, unspecified: Secondary | ICD-10-CM | POA: Diagnosis not present

## 2014-06-08 DIAGNOSIS — Z1231 Encounter for screening mammogram for malignant neoplasm of breast: Secondary | ICD-10-CM | POA: Diagnosis not present

## 2014-06-18 ENCOUNTER — Ambulatory Visit (INDEPENDENT_AMBULATORY_CARE_PROVIDER_SITE_OTHER): Payer: Medicare Other | Admitting: Podiatry

## 2014-06-18 ENCOUNTER — Encounter: Payer: Self-pay | Admitting: Podiatry

## 2014-06-18 DIAGNOSIS — E1151 Type 2 diabetes mellitus with diabetic peripheral angiopathy without gangrene: Secondary | ICD-10-CM

## 2014-06-18 DIAGNOSIS — M79673 Pain in unspecified foot: Secondary | ICD-10-CM

## 2014-06-18 DIAGNOSIS — B351 Tinea unguium: Secondary | ICD-10-CM

## 2014-06-18 DIAGNOSIS — Q828 Other specified congenital malformations of skin: Secondary | ICD-10-CM

## 2014-06-18 NOTE — Patient Instructions (Signed)
Diabetes and Foot Care Diabetes may cause you to have problems because of poor blood supply (circulation) to your feet and legs. This may cause the skin on your feet to become thinner, break easier, and heal more slowly. Your skin may become dry, and the skin may peel and crack. You may also have nerve damage in your legs and feet causing decreased feeling in them. You may not notice minor injuries to your feet that could lead to infections or more serious problems. Taking care of your feet is one of the most important things you can do for yourself.  HOME CARE INSTRUCTIONS  Wear shoes at all times, even in the house. Do not go barefoot. Bare feet are easily injured.  Check your feet daily for blisters, cuts, and redness. If you cannot see the bottom of your feet, use a mirror or ask someone for help.  Wash your feet with warm water (do not use hot water) and mild soap. Then pat your feet and the areas between your toes until they are completely dry. Do not soak your feet as this can dry your skin.  Apply a moisturizing lotion or petroleum jelly (that does not contain alcohol and is unscented) to the skin on your feet and to dry, brittle toenails. Do not apply lotion between your toes.  Trim your toenails straight across. Do not dig under them or around the cuticle. File the edges of your nails with an emery board or nail file.  Do not cut corns or calluses or try to remove them with medicine.  Wear clean socks or stockings every day. Make sure they are not too tight. Do not wear knee-high stockings since they may decrease blood flow to your legs.  Wear shoes that fit properly and have enough cushioning. To break in new shoes, wear them for just a few hours a day. This prevents you from injuring your feet. Always look in your shoes before you put them on to be sure there are no objects inside.  Do not cross your legs. This may decrease the blood flow to your feet.  If you find a minor scrape,  cut, or break in the skin on your feet, keep it and the skin around it clean and dry. These areas may be cleansed with mild soap and water. Do not cleanse the area with peroxide, alcohol, or iodine.  When you remove an adhesive bandage, be sure not to damage the skin around it.  If you have a wound, look at it several times a day to make sure it is healing.  Do not use heating pads or hot water bottles. They may burn your skin. If you have lost feeling in your feet or legs, you may not know it is happening until it is too late.  Make sure your health care provider performs a complete foot exam at least annually or more often if you have foot problems. Report any cuts, sores, or bruises to your health care provider immediately. SEEK MEDICAL CARE IF:   You have an injury that is not healing.  You have cuts or breaks in the skin.  You have an ingrown nail.  You notice redness on your legs or feet.  You feel burning or tingling in your legs or feet.  You have pain or cramps in your legs and feet.  Your legs or feet are numb.  Your feet always feel cold. SEEK IMMEDIATE MEDICAL CARE IF:   There is increasing redness,   swelling, or pain in or around a wound.  There is a red line that goes up your leg.  Pus is coming from a wound.  You develop a fever or as directed by your health care provider.  You notice a bad smell coming from an ulcer or wound. Document Released: 07/24/2000 Document Revised: 03/29/2013 Document Reviewed: 01/03/2013 [employer] Patient Information 2015 [employer]. This information is not intended to replace advice given to you by your health care provider. Make sure you discuss any questions you have with your health care provider.  

## 2014-06-19 NOTE — Progress Notes (Signed)
Subjective:     Patient ID: [name] [name], female   DOB: 1945-09-23, 68 y.o.   MRN: [id number]  HPIpatient presents with nail disease 1-5 both feet and lesions on both feet which become painful. He does have long-term diabetes and has neurological and vascular diminishment secondary to the disease process   Review of Systems     Objective:   Physical Exam Neurovascular status diminished but unchanged from previous visit with thick yellow brittle nailbeds 1-5 both feet and lesions on the plantar aspect of first and fifth metatarsals that are painful when pressed    Assessment:     Lesion secondary to bone pressure and nail disease with pain in and at risk diabetic    Plan:     Deep debridement of lesions and debridement of nailbeds 1-5 both feet with no iatrogenic bleeding noted

## 2014-06-20 DIAGNOSIS — K591 Functional diarrhea: Secondary | ICD-10-CM | POA: Diagnosis not present

## 2014-06-20 DIAGNOSIS — K5289 Other specified noninfective gastroenteritis and colitis: Secondary | ICD-10-CM | POA: Diagnosis not present

## 2014-06-20 DIAGNOSIS — R159 Full incontinence of feces: Secondary | ICD-10-CM | POA: Diagnosis not present

## 2014-06-21 DIAGNOSIS — K591 Functional diarrhea: Secondary | ICD-10-CM | POA: Diagnosis not present

## 2014-06-25 DIAGNOSIS — R197 Diarrhea, unspecified: Secondary | ICD-10-CM | POA: Diagnosis not present

## 2014-06-25 DIAGNOSIS — D126 Benign neoplasm of colon, unspecified: Secondary | ICD-10-CM | POA: Diagnosis not present

## 2014-06-25 DIAGNOSIS — K5730 Diverticulosis of large intestine without perforation or abscess without bleeding: Secondary | ICD-10-CM | POA: Diagnosis not present

## 2014-06-25 DIAGNOSIS — D122 Benign neoplasm of ascending colon: Secondary | ICD-10-CM | POA: Diagnosis not present

## 2014-06-25 DIAGNOSIS — K5289 Other specified noninfective gastroenteritis and colitis: Secondary | ICD-10-CM | POA: Diagnosis not present

## 2014-06-25 DIAGNOSIS — R195 Other fecal abnormalities: Secondary | ICD-10-CM | POA: Diagnosis not present

## 2014-06-27 ENCOUNTER — Encounter: Payer: Self-pay | Admitting: Cardiovascular Disease

## 2014-06-28 DIAGNOSIS — R197 Diarrhea, unspecified: Secondary | ICD-10-CM | POA: Diagnosis not present

## 2014-07-02 DIAGNOSIS — G4700 Insomnia, unspecified: Secondary | ICD-10-CM | POA: Diagnosis not present

## 2014-07-02 DIAGNOSIS — K5289 Other specified noninfective gastroenteritis and colitis: Secondary | ICD-10-CM | POA: Diagnosis not present

## 2014-07-02 DIAGNOSIS — Z86010 Personal history of colonic polyps: Secondary | ICD-10-CM | POA: Diagnosis not present

## 2014-07-20 DIAGNOSIS — M1711 Unilateral primary osteoarthritis, right knee: Secondary | ICD-10-CM | POA: Diagnosis not present

## 2014-07-25 DIAGNOSIS — L814 Other melanin hyperpigmentation: Secondary | ICD-10-CM | POA: Diagnosis not present

## 2014-07-25 DIAGNOSIS — D239 Other benign neoplasm of skin, unspecified: Secondary | ICD-10-CM | POA: Diagnosis not present

## 2014-07-25 DIAGNOSIS — D1801 Hemangioma of skin and subcutaneous tissue: Secondary | ICD-10-CM | POA: Diagnosis not present

## 2014-07-25 DIAGNOSIS — L821 Other seborrheic keratosis: Secondary | ICD-10-CM | POA: Diagnosis not present

## 2014-07-25 DIAGNOSIS — L400 Psoriasis vulgaris: Secondary | ICD-10-CM | POA: Diagnosis not present

## 2014-08-24 DIAGNOSIS — R197 Diarrhea, unspecified: Secondary | ICD-10-CM | POA: Diagnosis not present

## 2014-08-24 DIAGNOSIS — K5289 Other specified noninfective gastroenteritis and colitis: Secondary | ICD-10-CM | POA: Diagnosis not present

## 2014-09-10 ENCOUNTER — Ambulatory Visit: Payer: Medicare Other

## 2014-09-12 DIAGNOSIS — K5289 Other specified noninfective gastroenteritis and colitis: Secondary | ICD-10-CM | POA: Diagnosis not present

## 2014-10-02 DIAGNOSIS — G629 Polyneuropathy, unspecified: Secondary | ICD-10-CM | POA: Diagnosis not present

## 2014-10-02 DIAGNOSIS — Z6835 Body mass index (BMI) 35.0-35.9, adult: Secondary | ICD-10-CM | POA: Diagnosis not present

## 2014-10-02 DIAGNOSIS — E669 Obesity, unspecified: Secondary | ICD-10-CM | POA: Diagnosis not present

## 2014-10-02 DIAGNOSIS — I10 Essential (primary) hypertension: Secondary | ICD-10-CM | POA: Diagnosis not present

## 2014-10-02 DIAGNOSIS — G4733 Obstructive sleep apnea (adult) (pediatric): Secondary | ICD-10-CM | POA: Diagnosis not present

## 2014-10-02 DIAGNOSIS — K5289 Other specified noninfective gastroenteritis and colitis: Secondary | ICD-10-CM | POA: Diagnosis not present

## 2014-10-02 DIAGNOSIS — F325 Major depressive disorder, single episode, in full remission: Secondary | ICD-10-CM | POA: Diagnosis not present

## 2014-10-02 DIAGNOSIS — Z23 Encounter for immunization: Secondary | ICD-10-CM | POA: Diagnosis not present

## 2014-10-04 DIAGNOSIS — L408 Other psoriasis: Secondary | ICD-10-CM | POA: Diagnosis not present

## 2014-10-04 DIAGNOSIS — M159 Polyosteoarthritis, unspecified: Secondary | ICD-10-CM | POA: Diagnosis not present

## 2014-10-04 DIAGNOSIS — M1009 Idiopathic gout, multiple sites: Secondary | ICD-10-CM | POA: Diagnosis not present

## 2014-10-04 DIAGNOSIS — L4050 Arthropathic psoriasis, unspecified: Secondary | ICD-10-CM | POA: Diagnosis not present

## 2014-10-05 ENCOUNTER — Emergency Department (HOSPITAL_COMMUNITY)
Admission: EM | Admit: 2014-10-05 | Discharge: 2014-10-06 | Disposition: A | Discharge status: Home / Self Care | Payer: Medicare Other | Attending: Emergency Medicine | Admitting: Emergency Medicine

## 2014-10-05 ENCOUNTER — Emergency Department (HOSPITAL_COMMUNITY): Payer: Medicare Other

## 2014-10-05 ENCOUNTER — Encounter (HOSPITAL_COMMUNITY): Payer: Self-pay | Admitting: Emergency Medicine

## 2014-10-05 DIAGNOSIS — E119 Type 2 diabetes mellitus without complications: Secondary | ICD-10-CM | POA: Diagnosis not present

## 2014-10-05 DIAGNOSIS — K2970 Gastritis, unspecified, without bleeding: Secondary | ICD-10-CM | POA: Insufficient documentation

## 2014-10-05 DIAGNOSIS — Z7982 Long term (current) use of aspirin: Secondary | ICD-10-CM | POA: Insufficient documentation

## 2014-10-05 DIAGNOSIS — M1990 Unspecified osteoarthritis, unspecified site: Secondary | ICD-10-CM | POA: Insufficient documentation

## 2014-10-05 DIAGNOSIS — G4730 Sleep apnea, unspecified: Secondary | ICD-10-CM | POA: Diagnosis not present

## 2014-10-05 DIAGNOSIS — Z872 Personal history of diseases of the skin and subcutaneous tissue: Secondary | ICD-10-CM | POA: Diagnosis not present

## 2014-10-05 DIAGNOSIS — K219 Gastro-esophageal reflux disease without esophagitis: Secondary | ICD-10-CM | POA: Insufficient documentation

## 2014-10-05 DIAGNOSIS — R52 Pain, unspecified: Secondary | ICD-10-CM | POA: Diagnosis present

## 2014-10-05 DIAGNOSIS — Z9981 Dependence on supplemental oxygen: Secondary | ICD-10-CM | POA: Diagnosis not present

## 2014-10-05 DIAGNOSIS — T182XXA Foreign body in stomach, initial encounter: Secondary | ICD-10-CM | POA: Diagnosis not present

## 2014-10-05 DIAGNOSIS — R109 Unspecified abdominal pain: Secondary | ICD-10-CM | POA: Diagnosis not present

## 2014-10-05 DIAGNOSIS — Z0389 Encounter for observation for other suspected diseases and conditions ruled out: Secondary | ICD-10-CM | POA: Diagnosis not present

## 2014-10-05 DIAGNOSIS — I10 Essential (primary) hypertension: Secondary | ICD-10-CM | POA: Insufficient documentation

## 2014-10-05 DIAGNOSIS — Z79899 Other long term (current) drug therapy: Secondary | ICD-10-CM | POA: Diagnosis not present

## 2014-10-05 NOTE — ED Provider Notes (Signed)
CSN: [id number]     Arrival date & time 10/05/14  2315 History   First MD Initiated Contact with Patient 10/05/14 2320     Chief Complaint  Patient presents with  . Swallowed Foreign Body     (Consider location/radiation/quality/duration/timing/severity/associated sxs/prior Treatment) Patient is a 69 y.o. female presenting with foreign body swallowed. The history is provided by the patient.  Swallowed Foreign Body This is a new problem. The current episode started 1 to 2 hours ago. The problem occurs constantly. The problem has not changed since onset.Pertinent negatives include no chest pain, no abdominal pain, no headaches and no shortness of breath. Nothing aggravates the symptoms. Nothing relieves the symptoms. She has tried nothing for the symptoms. The treatment provided no relief.    Past Medical History  Diagnosis Date  . Sleep apnea     bipap  . Diabetes mellitus without complication     diet controlled   . Deep vein thrombosis     right calf - 05/2012   . GERD (gastroesophageal reflux disease)   . Arthritis   . Neuropathy     diabetic - in bilateral feet   . Pityriasis lichenoides chronica   . Lymphocytic colitis   . Hyperlipidemia   . Hypertension     Ejection fraction =>55% Left ventricular systolic function is normal. Left ventricular wall motion is normal     Past Surgical History  Procedure Laterality Date  . Dilation and curettage of uterus    . Torn meniscus repair       right knee   . Hammer toe surgery    . Right hand surgery       due to blood infection   . Total hip arthroplasty  09/13/2012    Procedure: TOTAL HIP ARTHROPLASTY ANTERIOR APPROACH;  Surgeon: Mauri Pole, MD;  Location: [facility];  Service: Orthopedics;  Laterality: Right;   History reviewed. No pertinent family history. History  Substance Use Topics  . Smoking status: Never Smoker   . Smokeless tobacco: Never Used  . Alcohol Use: Yes     Comment: occasional wine    OB History    No  data available     Review of Systems  Respiratory: Negative for shortness of breath.   Cardiovascular: Negative for chest pain.  Gastrointestinal: Negative for abdominal pain.  Neurological: Negative for headaches.  All other systems reviewed and are negative.     Allergies  Sulfa antibiotics  Home Medications   Prior to Admission medications   Medication Sig Start Date End Date Taking? Authorizing Provider  adalimumab (HUMIRA PEN) 40 MG/0.8ML injection Inject 40 mg into the skin every 14 (fourteen) days. She takes on Sunday.    Historical Provider, MD  allopurinol (ZYLOPRIM) 100 MG tablet Take 100 mg by mouth 2 (two) times daily.    Historical Provider, MD  aspirin 81 MG tablet Take 81 mg by mouth daily.    Historical Provider, MD  Cholecalciferol (VITAMIN D3) 1000 UNITS CAPS Take 1,000 Units by mouth every morning.    Historical Provider, MD  Cyanocobalamin (B-12 SL) Place 1 tablet under the tongue daily.    Historical Provider, MD  DULoxetine (CYMBALTA) 30 MG capsule Take 30 mg by mouth daily.    Historical Provider, MD  FOLIC ACID PO Take 1 tablet by mouth 2 (two) times daily.    Historical Provider, MD  lisinopril-hydrochlorothiazide (PRINZIDE,ZESTORETIC) 20-12.5 MG per tablet Take 1 tablet by mouth every morning.    Historical Provider,  MD  loperamide (IMODIUM) 2 MG capsule Take 6 mg by mouth 2 (two) times daily.    Historical Provider, MD  MELATONIN SL Place 5 capsules under the tongue at bedtime.    Historical Provider, MD  methotrexate (RHEUMATREX) 2.5 MG tablet Take by mouth once a week. Caution:Chemotherapy. Protect from light. 8 tablets, once a week    Historical Provider, MD  mometasone (ELOCON) 0.1 % cream Apply 1 application topically daily as needed. For psoriasis.    Historical Provider, MD  mupirocin cream (BACTROBAN) 2 % Apply 1 application topically 2 (two) times daily as needed. For psoriasis.    Historical Provider, MD  omeprazole (PRILOSEC) 40 MG capsule Take  40 mg by mouth at bedtime.    Historical Provider, MD  PRESCRIPTION MEDICATION Place 3 drops into both ears 2 (two) times daily as needed. Fluocinolone 0.01% Ear Drops. For inflammation and itching of the ear.    Historical Provider, MD   BP 155/75 mmHg  Pulse 77  Temp(Src) 98.1 F (36.7 C) (Oral)  Resp 18  Ht 5\' 6"  (1.676 m)  Wt 235 lb (106.595 kg)  BMI 37.95 kg/m2  SpO2 98% Physical Exam  Constitutional: She is oriented to person, place, and time. She appears well-developed and well-nourished. No distress.  HENT:  Head: Normocephalic and atraumatic.  Mouth/Throat: Oropharynx is clear and moist.  Eyes: Conjunctivae are normal. Pupils are equal, round, and reactive to light.  Neck: Normal range of motion. Neck supple.  Cardiovascular: Normal rate, regular rhythm and intact distal pulses.   Pulmonary/Chest: Effort normal and breath sounds normal. No stridor. No respiratory distress. She has no wheezes. She has no rales. She exhibits no tenderness.  Abdominal: Soft. Bowel sounds are normal. She exhibits no distension. There is no tenderness. There is no rebound and no guarding.  Musculoskeletal: Normal range of motion.  Neurological: She is alert and oriented to person, place, and time.  Skin: Skin is warm and dry.  Psychiatric: She has a normal mood and affect.    ED Course  Procedures (including critical care time) Labs Review Labs Reviewed - No data to display  Imaging Review No results found.   EKG Interpretation None      MDM   Final diagnoses:  Pain    Follow up with your family doctor, no foreign bodies. .  Strict return precautions given    Mitzi Lilja K Reizel Calzada-Rasch, MD 10/06/14 [phone]

## 2014-10-05 NOTE — ED Notes (Signed)
Pt states she believes she swallowed sewing straight pin about 1 hour ago, unintentional while holding them in her mouth.

## 2014-10-06 DIAGNOSIS — K2970 Gastritis, unspecified, without bleeding: Secondary | ICD-10-CM | POA: Diagnosis not present

## 2014-10-06 MED ORDER — SUCRALFATE 1 GM/10ML PO SUSP: 1.0000 g | Freq: Three times a day (TID) | ORAL | Status: DC

## 2014-10-06 MED ORDER — GI COCKTAIL ~~LOC~~
30.0000 mL | Freq: Once | ORAL | Status: AC
  Administered 2014-10-06: 30 mL via ORAL
  Filled 2014-10-06: qty 30

## 2014-10-08 ENCOUNTER — Ambulatory Visit (INDEPENDENT_AMBULATORY_CARE_PROVIDER_SITE_OTHER): Payer: Medicare Other | Admitting: Podiatry

## 2014-10-08 ENCOUNTER — Encounter: Payer: Self-pay | Admitting: Podiatry

## 2014-10-08 DIAGNOSIS — G609 Hereditary and idiopathic neuropathy, unspecified: Secondary | ICD-10-CM | POA: Diagnosis not present

## 2014-10-08 DIAGNOSIS — M14671 Charcot's joint, right ankle and foot: Secondary | ICD-10-CM | POA: Diagnosis not present

## 2014-10-08 DIAGNOSIS — E11610 Type 2 diabetes mellitus with diabetic neuropathic arthropathy: Secondary | ICD-10-CM | POA: Diagnosis not present

## 2014-10-08 DIAGNOSIS — M79673 Pain in unspecified foot: Secondary | ICD-10-CM | POA: Diagnosis not present

## 2014-10-08 DIAGNOSIS — B351 Tinea unguium: Secondary | ICD-10-CM | POA: Diagnosis not present

## 2014-10-09 NOTE — Progress Notes (Signed)
Subjective:     Patient ID: [name] [name], female   DOB: [birth year], 69 y.o.   MRN: [id number]  HPI patient presents with painful thick nailbeds 1-5 both feet with incurvation of the corners and discomfort when pressed   Review of Systems     Objective:   Physical Exam Neurovascular status intact with thick yellow brittle nailbeds 1-5 both feet and it is very painful when they're pressed    Assessment:     Painful mycotic nail infection 1-5 both feet    Plan:     Debride thick painful nailbeds 1-5 both feet with no iatrogenic bleeding noted

## 2014-10-18 DIAGNOSIS — H5213 Myopia, bilateral: Secondary | ICD-10-CM | POA: Diagnosis not present

## 2014-10-18 DIAGNOSIS — Z961 Presence of intraocular lens: Secondary | ICD-10-CM | POA: Diagnosis not present

## 2014-10-18 DIAGNOSIS — H43813 Vitreous degeneration, bilateral: Secondary | ICD-10-CM | POA: Diagnosis not present

## 2014-10-18 DIAGNOSIS — H04123 Dry eye syndrome of bilateral lacrimal glands: Secondary | ICD-10-CM | POA: Diagnosis not present

## 2014-10-24 ENCOUNTER — Telehealth: Payer: Self-pay | Admitting: Cardiovascular Disease

## 2014-10-24 NOTE — Telephone Encounter (Signed)
[name] [name] is calling because on yesterday she experience a Cramping in the middle of her chest , weakness and sweating . Do not know how long it lasted . Please call   Thanks

## 2014-10-24 NOTE — Telephone Encounter (Signed)
No answer when called back.

## 2014-10-24 NOTE — Telephone Encounter (Signed)
Pt had episode of midsternal 'cramping' yesterday afternoon that lasted for unspecified duration. She notes "probably brief, but I have no idea".  Sweating and "felt funny" assoc with this. All symptoms seemed to resolve after a few minutes. She notes she felt tired the morning of, which was unusual.  Pt states sensation similar to occasional cramping she gets in shoulders.  Advised ER if symptoms return.  Pt wanted to be seen by Dr. Claiborne Billings, if not urgent, she was not enthusiastic about being seen by another provider.  Dr. Debara Pickett agreed w/ ER evaluation for returning symptoms, stated OK to schedule for Dr. Evette Georges next available.  Called pt back, scheduled w/ Dr. Claiborne Billings. Advised for any changes to seek eval and/or call us as warranted. She voiced understanding & agreement w/ plan.

## 2014-10-24 NOTE — Telephone Encounter (Signed)
Pt calling back again.

## 2014-11-13 DIAGNOSIS — M25561 Pain in right knee: Secondary | ICD-10-CM | POA: Diagnosis not present

## 2014-12-17 DIAGNOSIS — K5289 Other specified noninfective gastroenteritis and colitis: Secondary | ICD-10-CM | POA: Diagnosis not present

## 2014-12-18 DIAGNOSIS — M76899 Other specified enthesopathies of unspecified lower limb, excluding foot: Secondary | ICD-10-CM | POA: Diagnosis not present

## 2014-12-19 DIAGNOSIS — R61 Generalized hyperhidrosis: Secondary | ICD-10-CM | POA: Diagnosis not present

## 2014-12-19 DIAGNOSIS — K5289 Other specified noninfective gastroenteritis and colitis: Secondary | ICD-10-CM | POA: Diagnosis not present

## 2014-12-19 DIAGNOSIS — R32 Unspecified urinary incontinence: Secondary | ICD-10-CM | POA: Diagnosis not present

## 2014-12-19 DIAGNOSIS — Z01419 Encounter for gynecological examination (general) (routine) without abnormal findings: Secondary | ICD-10-CM | POA: Diagnosis not present

## 2015-01-10 ENCOUNTER — Ambulatory Visit (INDEPENDENT_AMBULATORY_CARE_PROVIDER_SITE_OTHER): Payer: Medicare Other | Admitting: Cardiovascular Disease

## 2015-01-10 VITALS — BP 122/72 | HR 81 | Ht 66.5 in | Wt 237.5 lb

## 2015-01-10 DIAGNOSIS — G4733 Obstructive sleep apnea (adult) (pediatric): Secondary | ICD-10-CM

## 2015-01-10 DIAGNOSIS — I4510 Unspecified right bundle-branch block: Secondary | ICD-10-CM | POA: Diagnosis not present

## 2015-01-10 DIAGNOSIS — M2550 Pain in unspecified joint: Secondary | ICD-10-CM | POA: Diagnosis not present

## 2015-01-10 DIAGNOSIS — R079 Chest pain, unspecified: Secondary | ICD-10-CM

## 2015-01-10 DIAGNOSIS — L4050 Arthropathic psoriasis, unspecified: Secondary | ICD-10-CM | POA: Diagnosis not present

## 2015-01-10 DIAGNOSIS — K5289 Other specified noninfective gastroenteritis and colitis: Secondary | ICD-10-CM | POA: Diagnosis not present

## 2015-01-10 DIAGNOSIS — R0789 Other chest pain: Secondary | ICD-10-CM | POA: Diagnosis not present

## 2015-01-10 DIAGNOSIS — I10 Essential (primary) hypertension: Secondary | ICD-10-CM

## 2015-01-10 DIAGNOSIS — M1009 Idiopathic gout, multiple sites: Secondary | ICD-10-CM | POA: Diagnosis not present

## 2015-01-10 DIAGNOSIS — E669 Obesity, unspecified: Secondary | ICD-10-CM | POA: Diagnosis not present

## 2015-01-10 DIAGNOSIS — Z9989 Dependence on other enabling machines and devices: Secondary | ICD-10-CM

## 2015-01-10 NOTE — Patient Instructions (Signed)
Your physician has requested that you have a lexiscan myoview. For further information please visit [email or web address]. Please follow instruction sheet, as given.   Your physician wants you to follow-up in: 6 months or sooner if needed. You will receive a reminder letter in the mail two months in advance. If you don't receive a letter, please call our office to schedule the follow-up appointment.

## 2015-01-11 ENCOUNTER — Encounter: Payer: Self-pay | Admitting: Cardiovascular Disease

## 2015-01-12 ENCOUNTER — Encounter: Payer: Self-pay | Admitting: Cardiovascular Disease

## 2015-01-12 DIAGNOSIS — R079 Chest pain, unspecified: Secondary | ICD-10-CM | POA: Insufficient documentation

## 2015-01-12 NOTE — Progress Notes (Signed)
Patient ID: [name] [name], female   DOB: 1945-09-30, 69 y.o.   MRN: [id number]     HPI: [name] [name] is a 69 y.o. female percents in the office today  For an 8 month followup cardiology evaluation.  [name] [name] is a retired [employer]  who has a history of hypertension, obesity, psoriatic arthritis, DVT, which occurred while traveling to [place], as well as complex obstructive sleep apnea.  She has had difficulty in the past with CPAP therapy. She has been able to tolerate BiPAP well but also has had difficulty with some of her high pressure requirements. In April 2014 I changed her maximum BiPAP pressure to 19 and her maximum CPAP pressure to 15. I also reduced her minimum CPAP pressure to 6 and her minimal IPAP pressure to 10 with pressure support of 4 and changed her Bi_Flex to 3. She has been followed by [facility].  She has been sleeping fairly well and sleeps much better than she had when she was on CPAP rather than her current BiPAP unit.   She has psoriatic arthritis and is on Humira as well as methotrexate followed by rheumatology.  She also has a Charcot joint in her right foot.   She is on Cymbalta for pain relief relative to this. She does not routinely exercise.  She keeps busy moving around, but typically does not do aerobic activity.  She has not been successful with significant weight loss.   She recently, she has been taking lisinopril HCTZ 20/12.5 mg for hypertension.   6 weeks ago, she experienced an episode of chest discomfort which she felt was like a cramp and she noticed this most when standing up, but was associated with diaphoresis and shortness of breath.  She does have family history for coronary artery disease both in her mother as well as in her maternal grandmother. She has not been able to be very active due to her Charcot foot.  She has noticed some mild shortness of breath with activity. She was very concerned about this chest discomfort.  She presents for evaluation.    Past Medical History  Diagnosis Date  . Sleep apnea     bipap  . Diabetes mellitus without complication     diet controlled   . Deep vein thrombosis     right calf - 05/2012   . GERD (gastroesophageal reflux disease)   . Arthritis   . Neuropathy     diabetic - in bilateral feet   . Pityriasis lichenoides chronica   . Lymphocytic colitis   . Hyperlipidemia   . Hypertension     Ejection fraction =>55% Left ventricular systolic function is normal. Left ventricular wall motion is normal      Past Surgical History  Procedure Laterality Date  . Dilation and curettage of uterus    . Torn meniscus repair       right knee   . Hammer toe surgery    . Right hand surgery       due to blood infection   . Total hip arthroplasty  09/13/2012    Procedure: TOTAL HIP ARTHROPLASTY ANTERIOR APPROACH;  Surgeon: Mauri Pole, MD;  Location: [facility];  Service: Orthopedics;  Laterality: Right;    Allergies  Allergen Reactions  . Other Shortness Of Breath    Blue fish: palms and feet turn red  . Sulfa Antibiotics Hives    Current Outpatient Prescriptions  Medication Sig Dispense Refill  . adalimumab (HUMIRA PEN) 40 MG/0.8ML injection  Inject 40 mg into the skin as directed. Every other week    . allopurinol (ZYLOPRIM) 100 MG tablet Take 100 mg by mouth 2 (two) times daily.    Marland Kitchen aspirin 81 MG tablet Take 81 mg by mouth daily.    . Cholecalciferol (VITAMIN D3) 1000 UNITS CAPS Take 1,000 Units by mouth 2 (two) times daily.     . Cyanocobalamin (B-12 SL) Place 1 tablet under the tongue daily.    . cyclobenzaprine (FLEXERIL) 10 MG tablet Take 10 mg by mouth 3 (three) times daily as needed for muscle spasms.    . DULoxetine (CYMBALTA) 30 MG capsule Take 60 mg by mouth daily.     Marland Kitchen ESTRACE VAGINAL 0.1 MG/GM vaginal cream Place 0.5 g vaginally as needed.  12  . FOLIC ACID PO Take 1 tablet by mouth daily.     Marland Kitchen lisinopril-hydrochlorothiazide (PRINZIDE,ZESTORETIC) 20-12.5 MG per tablet Take 1 tablet by  mouth every morning.    . loperamide (IMODIUM) 2 MG capsule Take 6 mg by mouth 2 (two) times daily.    Marland Kitchen MELATONIN SL Place 2 tablets under the tongue at bedtime.     . methotrexate (RHEUMATREX) 2.5 MG tablet Take 20 mg by mouth once a week. Caution:Chemotherapy. Protect from light. 8 tablets, once a week    . mometasone (ELOCON) 0.1 % cream Apply 1 application topically daily as needed. For psoriasis.    . mupirocin cream (BACTROBAN) 2 % Apply 1 application topically 2 (two) times daily as needed. For psoriasis.    Marland Kitchen omeprazole (PRILOSEC) 40 MG capsule Take 40 mg by mouth at bedtime.    Marland Kitchen PRESCRIPTION MEDICATION Place 3 drops into both ears 2 (two) times daily as needed. Fluocinolone 0.01% Ear Drops. For inflammation and itching of the ear.    . Probiotic Product (ALIGN PO) Take 1 capsule by mouth daily.    . sucralfate (CARAFATE) 1 GM/10ML suspension Take 10 mLs (1 g total) by mouth 4 (four) times daily -  with meals and at bedtime. 420 mL 0   No current facility-administered medications for this visit.    Socially she is widowed. She has 2 children and 2 grandchildren. Her daughter is a Engineer, drilling. There is no tobacco use. She does drink occasional alcohol.  ROS General: Negative; No fevers, chills, or night sweats;  HEENT: Negative; No changes in vision or hearing, sinus congestion, difficulty swallowing Pulmonary: Negative; No cough, wheezing, shortness of breath, hemoptysis Cardiovascular: Negative; No chest pain, presyncope, syncope, palpitations GI: Negative; No nausea, vomiting, diarrhea, or abdominal pain GU: Negative; No dysuria, hematuria, or difficulty voiding Musculoskeletal: Positive for R foot charcot joint; no myalgias, joint pain, or weakness Hematologic/Oncology: Negative; no easy bruising, bleeding Rheumatologic: Psoriatic arthritis;  History of gout Endocrine: Negative; no heat/cold intolerance; no diabetes Neuro: Negative; no changes in balance, headaches Skin:  Negative; No rashes or skin lesions Psychiatric: Negative; No behavioral problems, depression Sleep: Positive for complex sleep apnea on BiPAP therapy;  No residual snoring, daytime sleepiness, hypersomnolence, bruxism, restless legs, hypnogognic hallucinations, no cataplexy Other comprehensive 14 point system review is negative.  PE BP 122/72 mmHg  Pulse 81  Ht 5' 6.5" (1.689 m)  Wt 107.729 kg (237 lb 8 oz)  BMI 37.76 kg/m2   Wt Readings from Last 3 Encounters:  01/10/15 107.729 kg (237 lb 8 oz)  10/05/14 106.595 kg (235 lb)  05/18/14 106.958 kg (235 lb 12.8 oz)   General: Alert, oriented, no distress.  Skin: normal turgor, no  rashes HEENT: Normocephalic, atraumatic. Pupils round and reactive; sclera anicteric;no lid lag.  Nose without nasal septal hypertrophy Mouth/Parynx benign; Mallinpatti scale 3 Neck: No JVD, no carotid bruits with normal carotid upstroke Lungs: clear to ausculatation and percussion; no wheezing or rales Chest wall:  Positive for costochondral tenderness along the left sternal border Heart: RRR, s1 s2 normal 1/6 systolic murmur,  No diastolic murmur.  No rubs thrills or heaves. Abdomen: soft, nontender; no hepatosplenomehaly, BS+; abdominal aorta nontender and not dilated by palpation. Back: No CVA tenderness Pulses 2+ Extremities: no clubbing cyanosis or edema, Homan's sign negative  Neurologic: grossly nonfocal Psychological: Normal affect and mood  ECG (independently read by me):  Normal sinus rhythm with right bundle branch block.  October 2015ECG (independently read by me): Normal sinus rhythm at 71 beats per minute right bundle branch block with repolarization changes.  Normal intervals.  Prior July 2014 ECG sinus rhythm with incomplete right bundle branch block. QRS duration 118 ms. Nonspecific ST-T changes.  LABS: BMP Latest Ref Rng 09/14/2012 09/06/2012  Glucose 70 - 99 mg/dL 181(H) 108(H)  BUN 6 - 23 mg/dL 16 16  Creatinine 0.50 - 1.10 mg/dL  0.69 0.77  Sodium 135 - 145 mEq/L 131(L) 135  Potassium 3.5 - 5.1 mEq/L 4.5 4.2  Chloride 96 - 112 mEq/L 97 97  CO2 19 - 32 mEq/L 27 28  Calcium 8.4 - 10.5 mg/dL 8.6 9.8   No flowsheet data found. CBC Latest Ref Rng 09/14/2012 09/06/2012  WBC 4.0 - 10.5 K/uL 13.7(H) 7.0  Hemoglobin 12.0 - 15.0 g/dL 10.4(L) 14.0  Hematocrit 36.0 - 46.0 % 29.8(L) 41.5  Platelets 150 - 400 K/uL 181 229   Lab Results  Component Value Date   MCV 92.8 09/14/2012   MCV 94.3 09/06/2012   No results found for: TSH No results found for: HGBA1C  Lipid Panel  No results found for: CHOL, TRIG, HDL, CHOLHDL, VLDL, LDLCALC, LDLDIRECT  RADIOLOGY: No results found.    ASSESSMENT AND PLAN: Ms. Beldin has a history of hypertension, obesity, psoriatic arthritis, complex, sleep apnea, as well as a remote history of DVT.  Her blood pressure is well controlled on lisinopril HCT 20/12.5.  She has not been successful with weight loss over the past year.  Her body mass index is 37.76 kg/m squared compatible with moderate obesity. She has experienced recent episode of significant chest cramping when she noted this standing up. her pain is atypical in that she has not experienced classic exertional precipitation.  However, she was very alarmed because she became significantly diaphoretic and shortness of breath with this sensation.  She has a maternal grandmother who suffered a myocardial infarction at age 28 and her mother had congestive heart failure. She is unable to walk well secondary to her Charcot foot.  My suspicion is that this most likely is musculoskeletal chest wall pain.  She does have costochondral tenderness on examination.  However, I'm scheduling [facility] for further evaluation for potential ischemia  She is being weaned off Uceris steroid therapy very slowly by her rheumatologist and has experienced some swelling secondary to this.  Her body mass index is 37.5, compatible with moderate obesity.    Sh not had any further episodes of DVT.  Her initial episode occurred after a long flight to [place].  She does have complex sleep apnea.  She has a BiPAP unit  And seems to be sleeping well with current treatment. I will contact her regarding her  nuclear perfusion study.  If this is normal and she remains stable, I will see her in 6 months for reevaluation.  If abnormal, I will see her in follow-up and further recommendations were made at that time.   Troy Sine, MD, [facility]  01/12/2015 1:21 PM

## 2015-01-14 ENCOUNTER — Ambulatory Visit: Payer: Medicare Other

## 2015-01-15 ENCOUNTER — Encounter: Payer: Self-pay | Admitting: *Deleted

## 2015-01-16 ENCOUNTER — Telehealth (HOSPITAL_COMMUNITY): Payer: Self-pay | Admitting: *Deleted

## 2015-01-21 ENCOUNTER — Ambulatory Visit: Payer: Medicare Other

## 2015-01-21 DIAGNOSIS — M6281 Muscle weakness (generalized): Secondary | ICD-10-CM | POA: Diagnosis not present

## 2015-01-21 DIAGNOSIS — R3915 Urgency of urination: Secondary | ICD-10-CM | POA: Diagnosis not present

## 2015-01-21 DIAGNOSIS — N3941 Urge incontinence: Secondary | ICD-10-CM | POA: Diagnosis not present

## 2015-01-21 DIAGNOSIS — R278 Other lack of coordination: Secondary | ICD-10-CM | POA: Diagnosis not present

## 2015-01-25 ENCOUNTER — Telehealth (HOSPITAL_COMMUNITY): Payer: Self-pay

## 2015-01-25 NOTE — Telephone Encounter (Signed)
Encounter complete. 

## 2015-01-28 ENCOUNTER — Ambulatory Visit (INDEPENDENT_AMBULATORY_CARE_PROVIDER_SITE_OTHER): Payer: Medicare Other | Admitting: Podiatry

## 2015-01-28 ENCOUNTER — Encounter: Payer: Self-pay | Admitting: Podiatry

## 2015-01-28 ENCOUNTER — Telehealth: Payer: Self-pay | Admitting: *Deleted

## 2015-01-28 DIAGNOSIS — M6740 Ganglion, unspecified site: Secondary | ICD-10-CM

## 2015-01-28 DIAGNOSIS — M71371 Other bursal cyst, right ankle and foot: Secondary | ICD-10-CM | POA: Diagnosis not present

## 2015-01-28 DIAGNOSIS — Q828 Other specified congenital malformations of skin: Secondary | ICD-10-CM

## 2015-01-28 DIAGNOSIS — L110 Acquired keratosis follicularis: Secondary | ICD-10-CM

## 2015-01-28 DIAGNOSIS — B351 Tinea unguium: Secondary | ICD-10-CM

## 2015-01-28 DIAGNOSIS — L84 Corns and callosities: Secondary | ICD-10-CM

## 2015-01-28 DIAGNOSIS — E1151 Type 2 diabetes mellitus with diabetic peripheral angiopathy without gangrene: Secondary | ICD-10-CM | POA: Diagnosis not present

## 2015-01-28 DIAGNOSIS — L821 Other seborrheic keratosis: Secondary | ICD-10-CM | POA: Diagnosis not present

## 2015-01-28 DIAGNOSIS — M79673 Pain in unspecified foot: Secondary | ICD-10-CM

## 2015-01-28 NOTE — Telephone Encounter (Signed)
Right 1st toe dorsal skin wedge sent to [facility] for definitive diagnosis r/o verruca.

## 2015-01-28 NOTE — Progress Notes (Signed)
Subjective:     Patient ID: [name] [name], female   DOB: 06-Feb-1946, 69 y.o.   MRN: [id number]  HPI patient presents with painful thick nailbeds 1-5 both feet with incurvation of the corners and discomfort when pressed. Patient also presents with a small mass on the dorsum of the right big toe that measures approximately 8 mm x 1.0 cm   Review of Systems     Objective:   Physical Exam Neurovascular status intact with thick yellow brittle nailbeds 1-5 both feet and it is very painful when they're pressed soft tissue mass dorsum right foot with possible ganglionic type fluid or possible wart or other unknown soft tissue mass    Assessment:     Painful mycotic nail infection 1-5 both feet    Plan:     Debride thick painful nailbeds 1-5 both feet with no iatrogenic bleeding noted. Reviewed condition and at this time anesthetized the right big toe 60 mg Xylocaine Marcaine mixture sterile conditions remove the mass and applied sterile dressing and sent for pathology. Reappoint in the next several weeks

## 2015-01-29 ENCOUNTER — Encounter (HOSPITAL_COMMUNITY): Payer: Medicare Other

## 2015-01-30 ENCOUNTER — Ambulatory Visit (HOSPITAL_COMMUNITY)
Admission: RE | Admit: 2015-01-30 | Discharge: 2015-01-30 | Disposition: A | Discharge status: Home / Self Care | Payer: Medicare Other | Source: Ambulatory Visit | Attending: Cardiovascular Disease | Admitting: Cardiovascular Disease

## 2015-01-30 DIAGNOSIS — R0789 Other chest pain: Secondary | ICD-10-CM | POA: Insufficient documentation

## 2015-01-30 MED ORDER — REGADENOSON 0.4 MG/5ML IV SOLN
0.4000 mg | Freq: Once | INTRAVENOUS | Status: AC
  Administered 2015-01-30: 0.4 mg via INTRAVENOUS

## 2015-01-30 MED ORDER — TECHNETIUM TC 99M SESTAMIBI GENERIC - CARDIOLITE
31.0000 | Freq: Once | INTRAVENOUS | Status: AC | PRN
  Administered 2015-01-30: 31 via INTRAVENOUS

## 2015-01-30 MED ORDER — AMINOPHYLLINE 25 MG/ML IV SOLN
75.0000 mg | Freq: Once | INTRAVENOUS | Status: AC
  Administered 2015-01-30: 75 mg via INTRAVENOUS

## 2015-01-31 ENCOUNTER — Ambulatory Visit (HOSPITAL_COMMUNITY)
Admission: RE | Admit: 2015-01-31 | Discharge: 2015-01-31 | Disposition: A | Discharge status: Home / Self Care | Payer: Medicare Other | Source: Ambulatory Visit | Attending: Cardiovascular Disease | Admitting: Cardiovascular Disease

## 2015-01-31 ENCOUNTER — Encounter: Payer: Self-pay | Admitting: Cardiovascular Disease

## 2015-01-31 LAB — MYOCARDIAL PERFUSION IMAGING
LV dias vol: 74 mL
LV sys vol: 27 mL
Peak HR: 100 {beats}/min
Rest HR: 80 {beats}/min
SDS: 2
SRS: 2
SSS: 4
TID: 1.23

## 2015-01-31 MED ORDER — TECHNETIUM TC 99M SESTAMIBI GENERIC - CARDIOLITE: 31.2000 | Freq: Once | INTRAVENOUS | Status: AC | PRN

## 2015-02-04 DIAGNOSIS — N3941 Urge incontinence: Secondary | ICD-10-CM | POA: Diagnosis not present

## 2015-02-04 DIAGNOSIS — M6281 Muscle weakness (generalized): Secondary | ICD-10-CM | POA: Diagnosis not present

## 2015-02-04 DIAGNOSIS — R278 Other lack of coordination: Secondary | ICD-10-CM | POA: Diagnosis not present

## 2015-02-04 DIAGNOSIS — R3915 Urgency of urination: Secondary | ICD-10-CM | POA: Diagnosis not present

## 2015-02-26 ENCOUNTER — Encounter: Payer: Self-pay | Admitting: Podiatry

## 2015-03-25 ENCOUNTER — Ambulatory Visit
Admission: RE | Admit: 2015-03-25 | Discharge: 2015-03-25 | Disposition: A | Discharge status: Home / Self Care | Payer: Medicare Other | Source: Ambulatory Visit | Attending: Nurse Practitioner | Admitting: Nurse Practitioner

## 2015-03-25 ENCOUNTER — Other Ambulatory Visit: Payer: Self-pay | Admitting: Nurse Practitioner

## 2015-03-25 DIAGNOSIS — Z86718 Personal history of other venous thrombosis and embolism: Secondary | ICD-10-CM

## 2015-03-25 DIAGNOSIS — M79662 Pain in left lower leg: Secondary | ICD-10-CM | POA: Diagnosis not present

## 2015-03-25 DIAGNOSIS — M7989 Other specified soft tissue disorders: Secondary | ICD-10-CM | POA: Diagnosis not present

## 2015-04-10 DIAGNOSIS — Z0000 Encounter for general adult medical examination without abnormal findings: Secondary | ICD-10-CM | POA: Diagnosis not present

## 2015-04-10 DIAGNOSIS — E119 Type 2 diabetes mellitus without complications: Secondary | ICD-10-CM | POA: Diagnosis not present

## 2015-04-10 DIAGNOSIS — F325 Major depressive disorder, single episode, in full remission: Secondary | ICD-10-CM | POA: Diagnosis not present

## 2015-04-10 DIAGNOSIS — G4733 Obstructive sleep apnea (adult) (pediatric): Secondary | ICD-10-CM | POA: Diagnosis not present

## 2015-04-10 DIAGNOSIS — I10 Essential (primary) hypertension: Secondary | ICD-10-CM | POA: Diagnosis not present

## 2015-04-10 DIAGNOSIS — K5289 Other specified noninfective gastroenteritis and colitis: Secondary | ICD-10-CM | POA: Diagnosis not present

## 2015-04-10 DIAGNOSIS — Z79899 Other long term (current) drug therapy: Secondary | ICD-10-CM | POA: Diagnosis not present

## 2015-04-16 DIAGNOSIS — L4059 Other psoriatic arthropathy: Secondary | ICD-10-CM | POA: Diagnosis not present

## 2015-04-16 DIAGNOSIS — L408 Other psoriasis: Secondary | ICD-10-CM | POA: Diagnosis not present

## 2015-04-16 DIAGNOSIS — K5289 Other specified noninfective gastroenteritis and colitis: Secondary | ICD-10-CM | POA: Diagnosis not present

## 2015-04-16 DIAGNOSIS — Z79899 Other long term (current) drug therapy: Secondary | ICD-10-CM | POA: Diagnosis not present

## 2015-04-16 DIAGNOSIS — M1009 Idiopathic gout, multiple sites: Secondary | ICD-10-CM | POA: Diagnosis not present

## 2015-04-19 DIAGNOSIS — M14671 Charcot's joint, right ankle and foot: Secondary | ICD-10-CM | POA: Diagnosis not present

## 2015-04-30 DIAGNOSIS — M1711 Unilateral primary osteoarthritis, right knee: Secondary | ICD-10-CM | POA: Diagnosis not present

## 2015-05-09 ENCOUNTER — Encounter: Payer: Self-pay | Admitting: Podiatry

## 2015-05-09 ENCOUNTER — Ambulatory Visit (INDEPENDENT_AMBULATORY_CARE_PROVIDER_SITE_OTHER): Payer: Medicare Other

## 2015-05-09 ENCOUNTER — Ambulatory Visit (INDEPENDENT_AMBULATORY_CARE_PROVIDER_SITE_OTHER): Payer: Medicare Other | Admitting: Podiatry

## 2015-05-09 VITALS — BP 131/74 | HR 92 | Resp 16

## 2015-05-09 DIAGNOSIS — M79674 Pain in right toe(s): Secondary | ICD-10-CM

## 2015-05-09 DIAGNOSIS — L02611 Cutaneous abscess of right foot: Secondary | ICD-10-CM | POA: Diagnosis not present

## 2015-05-09 DIAGNOSIS — B351 Tinea unguium: Secondary | ICD-10-CM | POA: Diagnosis not present

## 2015-05-09 DIAGNOSIS — L03031 Cellulitis of right toe: Secondary | ICD-10-CM | POA: Diagnosis not present

## 2015-05-09 DIAGNOSIS — M79673 Pain in unspecified foot: Secondary | ICD-10-CM | POA: Diagnosis not present

## 2015-05-09 NOTE — Progress Notes (Signed)
Subjective:     Patient ID: [name] [name], female   DOB: [birth year], 69 y.o.   MRN: [id number]  HPI patient presents stating she's had a lot of pain in her right big toe interphalangeal joint over the last few days and it's been red with drainage noted and it was localized with no proximal edema erythema drainage noted also complains that her toenails are thick she cannot cut them herself she has diabetes and they're painful   Review of Systems     Objective:   Physical Exam Neurovascular status unchanged from previous visit with thick yellow brittle nailbeds 1-5 both feet and redness around the interphalangeal joint right big toe where there is a small abrasion measuring 3 x 3 mm that's localized with no subcutaneous exposure or proximal edema erythema or drainage noted    Assessment:     Abrasion right big toe with redness noted and nail disease with thickness 1-5 both feet that are painful    Plan:     H&P and condition reviewed with patient. Patient had debridement accomplished today 1-5 both feet I reviewed x-rays and then went ahead and applied thick padding to reduce stress on the dorsal tissue and advised if any redness or other issues should occur to let us know immediately

## 2015-05-13 DIAGNOSIS — Z13820 Encounter for screening for osteoporosis: Secondary | ICD-10-CM | POA: Diagnosis not present

## 2015-05-13 DIAGNOSIS — Z780 Asymptomatic menopausal state: Secondary | ICD-10-CM | POA: Diagnosis not present

## 2015-05-13 DIAGNOSIS — Z7952 Long term (current) use of systemic steroids: Secondary | ICD-10-CM | POA: Diagnosis not present

## 2015-05-16 ENCOUNTER — Ambulatory Visit: Payer: Medicare Other | Admitting: Podiatry

## 2015-06-20 DIAGNOSIS — K5289 Other specified noninfective gastroenteritis and colitis: Secondary | ICD-10-CM | POA: Diagnosis not present

## 2015-06-21 DIAGNOSIS — M19072 Primary osteoarthritis, left ankle and foot: Secondary | ICD-10-CM | POA: Diagnosis not present

## 2015-06-21 DIAGNOSIS — M25572 Pain in left ankle and joints of left foot: Secondary | ICD-10-CM | POA: Diagnosis not present

## 2015-06-21 DIAGNOSIS — M25472 Effusion, left ankle: Secondary | ICD-10-CM | POA: Diagnosis not present

## 2015-06-21 DIAGNOSIS — S93402A Sprain of unspecified ligament of left ankle, initial encounter: Secondary | ICD-10-CM | POA: Diagnosis not present

## 2015-07-15 DIAGNOSIS — Z23 Encounter for immunization: Secondary | ICD-10-CM | POA: Diagnosis not present

## 2015-07-18 DIAGNOSIS — K5289 Other specified noninfective gastroenteritis and colitis: Secondary | ICD-10-CM | POA: Diagnosis not present

## 2015-07-18 DIAGNOSIS — L4059 Other psoriatic arthropathy: Secondary | ICD-10-CM | POA: Diagnosis not present

## 2015-07-18 DIAGNOSIS — M79642 Pain in left hand: Secondary | ICD-10-CM | POA: Diagnosis not present

## 2015-07-18 DIAGNOSIS — M79641 Pain in right hand: Secondary | ICD-10-CM | POA: Diagnosis not present

## 2015-07-18 DIAGNOSIS — L408 Other psoriasis: Secondary | ICD-10-CM | POA: Diagnosis not present

## 2015-07-18 DIAGNOSIS — Z79899 Other long term (current) drug therapy: Secondary | ICD-10-CM | POA: Diagnosis not present

## 2015-07-18 DIAGNOSIS — M1009 Idiopathic gout, multiple sites: Secondary | ICD-10-CM | POA: Diagnosis not present

## 2015-07-30 DIAGNOSIS — M1711 Unilateral primary osteoarthritis, right knee: Secondary | ICD-10-CM | POA: Diagnosis not present

## 2015-08-01 DIAGNOSIS — Z1231 Encounter for screening mammogram for malignant neoplasm of breast: Secondary | ICD-10-CM | POA: Diagnosis not present

## 2015-08-15 ENCOUNTER — Ambulatory Visit: Payer: Medicare Other | Admitting: Podiatry

## 2015-08-15 DIAGNOSIS — J069 Acute upper respiratory infection, unspecified: Secondary | ICD-10-CM | POA: Diagnosis not present

## 2015-08-19 ENCOUNTER — Ambulatory Visit: Payer: Medicare Other | Admitting: Podiatry

## 2015-08-21 DIAGNOSIS — D1800 Hemangioma unspecified site: Secondary | ICD-10-CM | POA: Diagnosis not present

## 2015-08-21 DIAGNOSIS — L858 Other specified epidermal thickening: Secondary | ICD-10-CM | POA: Diagnosis not present

## 2015-08-21 DIAGNOSIS — L814 Other melanin hyperpigmentation: Secondary | ICD-10-CM | POA: Diagnosis not present

## 2015-08-21 DIAGNOSIS — D225 Melanocytic nevi of trunk: Secondary | ICD-10-CM | POA: Diagnosis not present

## 2015-08-21 DIAGNOSIS — Z23 Encounter for immunization: Secondary | ICD-10-CM | POA: Diagnosis not present

## 2015-08-21 DIAGNOSIS — L400 Psoriasis vulgaris: Secondary | ICD-10-CM | POA: Diagnosis not present

## 2015-08-21 DIAGNOSIS — Z808 Family history of malignant neoplasm of other organs or systems: Secondary | ICD-10-CM | POA: Diagnosis not present

## 2015-08-21 DIAGNOSIS — L821 Other seborrheic keratosis: Secondary | ICD-10-CM | POA: Diagnosis not present

## 2015-08-22 ENCOUNTER — Ambulatory Visit
Admission: RE | Admit: 2015-08-22 | Discharge: 2015-08-22 | Disposition: A | Discharge status: Home / Self Care | Payer: Medicare Other | Source: Ambulatory Visit | Attending: Geriatric Medicine | Admitting: Geriatric Medicine

## 2015-08-22 ENCOUNTER — Other Ambulatory Visit: Payer: Self-pay | Admitting: Geriatric Medicine

## 2015-08-22 DIAGNOSIS — R05 Cough: Secondary | ICD-10-CM

## 2015-08-22 DIAGNOSIS — R059 Cough, unspecified: Secondary | ICD-10-CM

## 2015-08-29 ENCOUNTER — Encounter: Payer: Self-pay | Admitting: Podiatry

## 2015-08-29 ENCOUNTER — Ambulatory Visit (INDEPENDENT_AMBULATORY_CARE_PROVIDER_SITE_OTHER): Payer: Medicare Other | Admitting: Podiatry

## 2015-08-29 DIAGNOSIS — B351 Tinea unguium: Secondary | ICD-10-CM | POA: Diagnosis not present

## 2015-08-29 DIAGNOSIS — M79673 Pain in unspecified foot: Secondary | ICD-10-CM

## 2015-08-29 NOTE — Progress Notes (Signed)
Subjective:     Patient ID: [name] [name], female   DOB: 05-Feb-1946, 70 y.o.   MRN: [id number]  HPI patient presents with chronic nail disease with thickness 1-5 both feet the become tender and make it hard to wear shoe gear comfortably   Review of Systems     Objective:   Physical Exam Yellow brittle mycotic nail infections 1-5 both feet that are painful and she is unable to trim    Assessment:     Painful mycotic nail infections 1-5 both feet    Plan:     Debridement and nailbeds accomplished today with no iatrogenic bleeding noted reappoint 3 months or earlier if needed

## 2015-09-11 ENCOUNTER — Telehealth: Payer: Self-pay | Admitting: Cardiovascular Disease

## 2015-09-11 NOTE — Telephone Encounter (Signed)
New message   Pt is calling for RN so that she can get a order for a new C-PAP machine

## 2015-09-12 NOTE — Telephone Encounter (Signed)
Returned a call to patient. Had to leave a message.

## 2015-09-12 NOTE — Telephone Encounter (Signed)
Returning your call. °

## 2015-09-13 DIAGNOSIS — M14671 Charcot's joint, right ankle and foot: Secondary | ICD-10-CM | POA: Diagnosis not present

## 2015-09-13 DIAGNOSIS — M19072 Primary osteoarthritis, left ankle and foot: Secondary | ICD-10-CM | POA: Diagnosis not present

## 2015-09-16 ENCOUNTER — Telehealth: Payer: Self-pay | Admitting: Cardiovascular Disease

## 2015-09-16 NOTE — Telephone Encounter (Signed)
New message    Patient returning call back to nurse from Thursday.

## 2015-09-16 NOTE — Telephone Encounter (Signed)
Spoke with pt, her machine is with [employer]. It stopped working and they were able to get it fixed but the company told her her CPAP machine needed to be replaced. Because it is sooner than when she can get it replaced she needs a doctor's script. Will make wanda, dr Evette Georges ma aware.

## 2015-09-24 NOTE — Telephone Encounter (Signed)
Spoke with pt, apologized to the pt for the delay. Follow up scheduled with dr Claiborne Billings next week to discuss Bi-Pap

## 2015-09-24 NOTE — Telephone Encounter (Signed)
Pt called ,still so upset because she have not heard from nobody. She needs her C-Pap machine asap.

## 2015-10-02 ENCOUNTER — Ambulatory Visit (INDEPENDENT_AMBULATORY_CARE_PROVIDER_SITE_OTHER): Payer: Medicare Other | Admitting: Cardiovascular Disease

## 2015-10-02 ENCOUNTER — Encounter: Payer: Self-pay | Admitting: Cardiovascular Disease

## 2015-10-02 VITALS — BP 112/70 | HR 80 | Ht 66.5 in | Wt 238.0 lb

## 2015-10-02 DIAGNOSIS — I10 Essential (primary) hypertension: Secondary | ICD-10-CM

## 2015-10-02 DIAGNOSIS — Z9989 Dependence on other enabling machines and devices: Secondary | ICD-10-CM

## 2015-10-02 DIAGNOSIS — G4733 Obstructive sleep apnea (adult) (pediatric): Secondary | ICD-10-CM | POA: Diagnosis not present

## 2015-10-02 DIAGNOSIS — E669 Obesity, unspecified: Secondary | ICD-10-CM | POA: Diagnosis not present

## 2015-10-02 NOTE — Patient Instructions (Addendum)
Your physician recommends that you schedule a follow-up appointment in: April sleep clinic.   

## 2015-10-02 NOTE — Progress Notes (Signed)
Patient ID: [name] [name], female   DOB: [birth year], 70 y.o.   MRN: [id number]     HPI: [name] [name] is a 70 y.o. female percents in the office today for an 8 month followup sleep/cardiology evaluation.  [name] is a retired [employer]  who has a history of hypertension, obesity, psoriatic arthritis, DVT, which occurred while traveling to [place], as well as complex obstructive sleep apnea.  She has had difficulty in the past with CPAP therapy. She has been able to tolerate BiPAP and has had a Respironics BiPAP Auto unit well but also has had difficulty with some of her high pressure requirements. In April 2014 I changed her maximum BiPAP pressure to 19 and her maximum EPAP pressure to 15. I also reduced her minimum EPAP pressure to 6 and her minimal IPAP pressure to 10 with pressure support of 4 and changed her Bi_Flex to 3. She has been followed by [facility].  Clinically, she had felt markedly improved with BiPAP.  Compared to prior CPAP therapy.  She has psoriatic arthritis and is on Humira as well as methotrexate followed by rheumatology.  She also has a Charcot joint in her right foot.   She is on Cymbalta for pain relief relative to this. She does not routinely exercise.  She keeps busy moving around, but typically does not do aerobic activity.  She has not been successful with significant weight loss.   She recently, she has been taking lisinopril HCTZ 20/12.5 mg for hypertension.  When I last saw her, she had experienced an episode of chest discomfort which she felt was like a cramp and she noticed this most when standing up, but was associated with diaphoresis and shortness of breath.  She does have family history for coronary artery disease both in her mother as well as in her maternal grandmother. She has not been able to be very active due to her Charcot foot.  She also had noticed some mild shortness of breath with activity.  She eventually was referred for a nuclear perfusion study on  01/31/2015 which revealed normal perfusion and function; ejection fraction 64%.  [name] [name] recently had a protracted course of bronchitis.  Last year she also had a comp, located course of lymphocytic colitis for which she required steroids for 11 months.  Recently, her BiPAP machine has begun to fail.  She continues to use CPAP with 100% compliance and cannot sleep without it.  She typically sleeps for at least 8 hours per night.  She had taken her BiPAP machine to Agency who stated that her machine essentially had stopped working and is in need for new machine.  She presents to the office today for further evaluation and prescription for a new BiPAP unit.  An Epworth Sleepiness Scale score was recalculated in the office today and this endorsed at 2, arguing against hypersomnolence.  There was only a moderate chance of dozing if she is lying down to rest in the afternoon when circumstances permit.  Past Medical History  Diagnosis Date  . Sleep apnea     bipap  . Diabetes mellitus without complication (HCC)     diet controlled   . Deep vein thrombosis ([place])     right calf - 05/2012   . GERD (gastroesophageal reflux disease)   . Arthritis   . Neuropathy ([place])     diabetic - in bilateral feet   . Pityriasis lichenoides chronica   . Lymphocytic colitis   . Hyperlipidemia   .  Hypertension     Ejection fraction =>55% Left ventricular systolic function is normal. Left ventricular wall motion is normal      Past Surgical History  Procedure Laterality Date  . Dilation and curettage of uterus    . Torn meniscus repair       right knee   . Hammer toe surgery    . Right hand surgery       due to blood infection   . Total hip arthroplasty  09/13/2012    Procedure: TOTAL HIP ARTHROPLASTY ANTERIOR APPROACH;  Surgeon: Mauri Pole, MD;  Location: [facility];  Service: Orthopedics;  Laterality: Right;    Allergies  Allergen Reactions  . Other Shortness Of Breath    Blue fish: palms and feet  turn red  . Sulfa Antibiotics Hives    Current Outpatient Prescriptions  Medication Sig Dispense Refill  . adalimumab (HUMIRA PEN) 40 MG/0.8ML injection Inject 40 mg into the skin once a week. Every other week    . allopurinol (ZYLOPRIM) 100 MG tablet Take 100 mg by mouth 2 (two) times daily.    Marland Kitchen aspirin 81 MG tablet Take 81 mg by mouth daily.    . Cholecalciferol (VITAMIN D3) 1000 UNITS CAPS Take 1,000 Units by mouth 2 (two) times daily.     . citalopram (CELEXA) 20 MG tablet Take 20 mg by mouth daily.    . Cyanocobalamin (B-12 SL) Place 1 tablet under the tongue daily.    . cyclobenzaprine (FLEXERIL) 10 MG tablet Take 10 mg by mouth 3 (three) times daily as needed for muscle spasms.    . FOLIC ACID PO Take 1 tablet by mouth daily.     Marland Kitchen lisinopril-hydrochlorothiazide (PRINZIDE,ZESTORETIC) 20-12.5 MG per tablet Take 1 tablet by mouth every morning.    . loperamide (IMODIUM) 2 MG capsule Take 6 mg by mouth 2 (two) times daily.    Marland Kitchen MELATONIN SL Place 2 tablets under the tongue at bedtime.     . methotrexate (RHEUMATREX) 2.5 MG tablet Take 20 mg by mouth once a week. Caution:Chemotherapy. Protect from light. 8 tablets, once a week    . mometasone (ELOCON) 0.1 % cream Apply 1 application topically daily as needed. For psoriasis.    . mupirocin cream (BACTROBAN) 2 % Apply 1 application topically 2 (two) times daily as needed. For psoriasis.    Marland Kitchen omeprazole (PRILOSEC) 40 MG capsule Take 40 mg by mouth at bedtime.    Marland Kitchen PRESCRIPTION MEDICATION Place 3 drops into both ears 2 (two) times daily as needed. Fluocinolone 0.01% Ear Drops. For inflammation and itching of the ear.    . Probiotic Product (ALIGN PO) Take 1 capsule by mouth daily.    . sucralfate (CARAFATE) 1 GM/10ML suspension Take 10 mLs (1 g total) by mouth 4 (four) times daily -  with meals and at bedtime. 420 mL 0   No current facility-administered medications for this visit.    Socially she is widowed. She has 2 children and 2  grandchildren. Her daughter is a Engineer, drilling. There is no tobacco use. She does drink occasional alcohol.  ROS General: Negative; No fevers, chills, or night sweats;  HEENT: Negative; No changes in vision or hearing, sinus congestion, difficulty swallowing Pulmonary: Negative; No cough, wheezing, shortness of breath, hemoptysis Cardiovascular: Negative; No chest pain, presyncope, syncope, palpitations GI: History of lymphocytic colitis GU: Negative; No dysuria, hematuria, or difficulty voiding Musculoskeletal: Positive for R foot charcot joint; no myalgias, joint pain, or weakness Hematologic/Oncology: Negative;  no easy bruising, bleeding Rheumatologic: Psoriatic arthritis;  History of gout Endocrine: Negative; no heat/cold intolerance; no diabetes Neuro: Negative; no changes in balance, headaches Skin: Negative; No rashes or skin lesions Psychiatric: Negative; No behavioral problems, depression Sleep: Positive for complex sleep apnea on BiPAP therapy;  No residual snoring, daytime sleepiness, hypersomnolence, bruxism, restless legs, hypnogognic hallucinations, no cataplexy Other comprehensive 14 point system review is negative.  PE BP 112/70 mmHg  Pulse 80  Ht 5' 6.5" (1.689 m)  Wt 238 lb (107.956 kg)  BMI 37.84 kg/m2   Wt Readings from Last 3 Encounters:  10/02/15 238 lb (107.956 kg)  01/30/15 237 lb (107.502 kg)  01/10/15 237 lb 8 oz (107.729 kg)   General: Alert, oriented, no distress.  Skin: normal turgor, no rashes HEENT: Normocephalic, atraumatic. Pupils round and reactive; sclera anicteric;no lid lag.  Nose without nasal septal hypertrophy Mouth/Parynx benign; Mallinpatti scale 3 Neck: No JVD, no carotid bruits with normal carotid upstroke Lungs: clear to ausculatation and percussion; no wheezing or rales Chest wall:  Positive for costochondral tenderness along the left sternal border Heart: RRR, s1 s2 normal 1/6 systolic murmur,  No diastolic murmur.  No rubs thrills or  heaves. Abdomen: soft, nontender; no hepatosplenomehaly, BS+; abdominal aorta nontender and not dilated by palpation. Back: No CVA tenderness Pulses 2+ Extremities: no clubbing cyanosis or edema, Homan's sign negative  Neurologic: grossly nonfocal Psychological: Normal affect and mood  No ECG was done today.  June 2016 ECG (independently read by me):  Normal sinus rhythm with right bundle branch block.  October 2015ECG (independently read by me): Normal sinus rhythm at 71 beats per minute right bundle branch block with repolarization changes.  Normal intervals.  Prior July 2014 ECG sinus rhythm with incomplete right bundle branch block. QRS duration 118 ms. Nonspecific ST-T changes.  LABS: BMP Latest Ref Rng 09/14/2012 09/06/2012  Glucose 70 - 99 mg/dL 181(H) 108(H)  BUN 6 - 23 mg/dL 16 16  Creatinine 0.50 - 1.10 mg/dL 0.69 0.77  Sodium 135 - 145 mEq/L 131(L) 135  Potassium 3.5 - 5.1 mEq/L 4.5 4.2  Chloride 96 - 112 mEq/L 97 97  CO2 19 - 32 mEq/L 27 28  Calcium 8.4 - 10.5 mg/dL 8.6 9.8   No flowsheet data found. CBC Latest Ref Rng 09/14/2012 09/06/2012  WBC 4.0 - 10.5 K/uL 13.7(H) 7.0  Hemoglobin 12.0 - 15.0 g/dL 10.4(L) 14.0  Hematocrit 36.0 - 46.0 % 29.8(L) 41.5  Platelets 150 - 400 K/uL 181 229   Lab Results  Component Value Date   MCV 92.8 09/14/2012   MCV 94.3 09/06/2012   No results found for: TSH No results found for: HGBA1C  Lipid Panel  No results found for: CHOL, TRIG, HDL, CHOLHDL, VLDL, LDLCALC, LDLDIRECT  RADIOLOGY: No results found.    ASSESSMENT AND PLAN: Ms. Hermance is a 70 year old white female who has a history of hypertension, obesity, psoriatic arthritis, complex, sleep apnea, as well as a remote history of DVT.  Her blood pressure today is well controlled on lisinopril HCT 20/12.5.  She has not been successful with weight loss over the past year.  Her body mass index is 37.84 kg/m squared compatible with moderate obesity.  Last year she had experienced an  episode of chest pain.  A nuclear perfusion study was entirely normal.  She has complex sleep apnea and has not tolerated CPAP therapy, but has been able to tolerate IPAP therapy, which she has been on for years.  Her machine  now is essentially nonfunctional.  I reviewed her last download that I have records of from last year which showed 100% of use with averaging 7 hours and 9 minutes of sleep per night.  I feel she would benefit from a new machine and will change her from her prior Respironics BiPAP auto to a new ResMed [employer].  I will initially set her EPAP minimum at 6, IPAP minimum at 10 with a pressure support of 4 but initially will allow the IPAP up to its maximum pressure of 25 to see how she does with this new machine.  I will see her in several months in sleep clinic for follow-up evaluation of her new machine and at that time titration or adjustment may be necessary.  I have written this prescription and will send this to [facility] for expeditious delivery of this new equipment.  Time spent: 25 minutes  Troy Sine, MD, [facility]  10/02/2015 6:18 PM

## 2015-10-03 NOTE — Telephone Encounter (Signed)
Patient came in for appointment. New BIPAP order written and faxed to [place] [facility].

## 2015-10-14 ENCOUNTER — Telehealth: Payer: Self-pay | Admitting: Cardiovascular Disease

## 2015-10-14 NOTE — Telephone Encounter (Signed)
Dr.Kelly signed order for [facility].Order faxed back to [facility] at fax # [phone].

## 2015-10-14 NOTE — Telephone Encounter (Signed)
New message      Calling to check the status on the certificate of medical necessity faxed on 10-09-15.  Did you receive it?  Has it been faxed back to [facility]?

## 2015-10-14 NOTE — Telephone Encounter (Signed)
Returned call to [place] with [facility].Stated she faxed over new order for medical necessity 10/09/15.Dr.Kelly was out of office last week.Advised to refax order and I will obtain a signature from [facility] and fax back.

## 2015-10-16 DIAGNOSIS — M79642 Pain in left hand: Secondary | ICD-10-CM | POA: Diagnosis not present

## 2015-10-16 DIAGNOSIS — M79641 Pain in right hand: Secondary | ICD-10-CM | POA: Diagnosis not present

## 2015-10-16 DIAGNOSIS — L4059 Other psoriatic arthropathy: Secondary | ICD-10-CM | POA: Diagnosis not present

## 2015-10-16 DIAGNOSIS — K5289 Other specified noninfective gastroenteritis and colitis: Secondary | ICD-10-CM | POA: Diagnosis not present

## 2015-10-16 DIAGNOSIS — L408 Other psoriasis: Secondary | ICD-10-CM | POA: Diagnosis not present

## 2015-10-16 DIAGNOSIS — Z79899 Other long term (current) drug therapy: Secondary | ICD-10-CM | POA: Diagnosis not present

## 2015-10-16 DIAGNOSIS — M1009 Idiopathic gout, multiple sites: Secondary | ICD-10-CM | POA: Diagnosis not present

## 2015-10-23 DIAGNOSIS — I10 Essential (primary) hypertension: Secondary | ICD-10-CM | POA: Diagnosis not present

## 2015-10-23 DIAGNOSIS — F411 Generalized anxiety disorder: Secondary | ICD-10-CM | POA: Diagnosis not present

## 2015-10-23 DIAGNOSIS — R21 Rash and other nonspecific skin eruption: Secondary | ICD-10-CM | POA: Diagnosis not present

## 2015-10-23 DIAGNOSIS — E119 Type 2 diabetes mellitus without complications: Secondary | ICD-10-CM | POA: Diagnosis not present

## 2015-10-23 DIAGNOSIS — F5102 Adjustment insomnia: Secondary | ICD-10-CM | POA: Diagnosis not present

## 2015-10-24 ENCOUNTER — Telehealth: Payer: Self-pay | Admitting: Cardiovascular Disease

## 2015-10-24 NOTE — Telephone Encounter (Signed)
Forward to Wanda CMA   

## 2015-10-24 NOTE — Telephone Encounter (Signed)
Pt called in stating that she has not received her Bi Pap machine yet and would like to know what she should do. Please f/u with her

## 2015-10-28 NOTE — Telephone Encounter (Signed)
Called and left patient a message that her BIPAP order was faxed to [facility] in February. I have not received any message from them stating they needed any further documentation. I will follow up on this today and let her know the status of her machine.

## 2015-10-31 DIAGNOSIS — H43813 Vitreous degeneration, bilateral: Secondary | ICD-10-CM | POA: Diagnosis not present

## 2015-10-31 DIAGNOSIS — E119 Type 2 diabetes mellitus without complications: Secondary | ICD-10-CM | POA: Diagnosis not present

## 2015-10-31 DIAGNOSIS — Z961 Presence of intraocular lens: Secondary | ICD-10-CM | POA: Diagnosis not present

## 2015-10-31 DIAGNOSIS — H5213 Myopia, bilateral: Secondary | ICD-10-CM | POA: Diagnosis not present

## 2015-11-04 ENCOUNTER — Telehealth: Payer: Self-pay | Admitting: *Deleted

## 2015-11-04 ENCOUNTER — Telehealth: Payer: Self-pay | Admitting: Cardiovascular Disease

## 2015-11-04 NOTE — Telephone Encounter (Signed)
Pt called a week or so ago and her bi-pap, talked to [place] to check on status of prescription with [facility], hasn't heard anything and wanted to get a call back today pls call [phone]

## 2015-11-04 NOTE — Telephone Encounter (Signed)
Returned a call to [place] with [facility]. She informs me that they have spoken with the patient and are planning to have her set-up on her BIPAP no later than Wednesday of this week. I asked her about the [name] that their office kept saying they did not have. Estill Bamberg says not to worry about it she has everything she needs to proceed with the setup.

## 2015-11-04 NOTE — Telephone Encounter (Signed)
Returned a call to patient to discuss her BIPAP machine. I informed her the orignal order was faxed to [place] on 10/02/15. We assumed that she had gotten her machine, until she called [place] stating that she had not. [name] spoke with anita at [facility] on 10/14/15 and had Dr Claiborne Billings to sign another order and again faxed it to the number provided to her.  The patient called again last week and I again spoke with someone at [facility] who states that they would be sending a order for Dr Claiborne Billings to sign. Unknowingly to me that cheryl had already sent a second order. I requested they send the [name] and I will have Dr Claiborne Billings to sign it. After not receiving this I called [facility] again to inform them I had not received the form and was told that I will have to wait until the next day " the person that handles this is not in today. It will be tomorrow before we can fax it." well as of 11/04/15  I have not received the [name]. I will call them again to see what progress if any they have made.

## 2015-11-04 NOTE — Telephone Encounter (Signed)
Called [facility] asked to speak with their office manager. A gentleman named Iona Beard took the call. I informed him that this patient has been trying to get a  BIPAP machine since February. After multiple calls and orders signed by Dr Claiborne Billings we are still being told they do not have the signed order. Iona Beard states that he will send ANOTHER order for signature. I expressed my concern that Dr Claiborne Billings is not in the office to sign the order this week and the patient has Medicare and therefore on a time restraint. Iona Beard apologized and said that maybe they can go ahead and get her started prior to getting the order ( which they should already have). He can see in the computer where several calls have been made on this account. He will review the chart and call myself and the patient back.

## 2015-11-05 ENCOUNTER — Telehealth (HOSPITAL_COMMUNITY): Payer: Self-pay | Admitting: Cardiovascular Disease

## 2015-11-05 NOTE — Telephone Encounter (Signed)
New message    [facility] calling back to speak with nurse

## 2015-11-05 NOTE — Telephone Encounter (Signed)
Returned call. [name] [name] notes the certificate of medical necessity was declined because incorrect date was signed by Dr. Claiborne Billings (2016 instead of 2017)  She is faxing over a form for correction.

## 2015-11-06 DIAGNOSIS — E119 Type 2 diabetes mellitus without complications: Secondary | ICD-10-CM | POA: Diagnosis not present

## 2015-11-06 DIAGNOSIS — Z7984 Long term (current) use of oral hypoglycemic drugs: Secondary | ICD-10-CM | POA: Diagnosis not present

## 2015-11-06 DIAGNOSIS — F419 Anxiety disorder, unspecified: Secondary | ICD-10-CM | POA: Diagnosis not present

## 2015-11-07 NOTE — Telephone Encounter (Signed)
Returned a call to [place] her that I did not receive the form that has the incorrect date on it. Requested that she refax it. She  Then tells me that she will give the original order to the department and see if they can use this. If I do not hear back from her, then " everything is good."

## 2015-11-12 ENCOUNTER — Telehealth: Payer: Self-pay | Admitting: Cardiovascular Disease

## 2015-11-12 NOTE — Telephone Encounter (Signed)
New Message  Pt called req a call back to discuss when the sleep clinic rep would be in office.

## 2015-11-14 ENCOUNTER — Ambulatory Visit: Payer: Medicare Other | Admitting: Cardiovascular Disease

## 2015-11-14 ENCOUNTER — Encounter: Payer: Self-pay | Admitting: *Deleted

## 2015-11-14 ENCOUNTER — Encounter: Payer: Medicare Other | Attending: Internal Medicine | Admitting: *Deleted

## 2015-11-14 DIAGNOSIS — E119 Type 2 diabetes mellitus without complications: Secondary | ICD-10-CM | POA: Diagnosis not present

## 2015-11-14 NOTE — Telephone Encounter (Signed)
New message   Pt is calling for rn to return call

## 2015-11-14 NOTE — Telephone Encounter (Signed)
Patient called no answer.LMTC. 

## 2015-11-14 NOTE — Patient Instructions (Signed)
Plan:  Aim for 2 Carb Choices per meal (30 grams) +/- 1 either way  Aim for 0-1 Carbs per snack if hungry  Include protein in moderation with your meals and snacks Consider reading food labels for Total Carbohydrate of foods Continue with your activity level as tolerated Consider checking BG at alternate times per day

## 2015-11-14 NOTE — Telephone Encounter (Signed)
Follow Up   Pt is returning call about her Bypap machine.

## 2015-11-14 NOTE — Telephone Encounter (Signed)
Called and informed patient that I have sent a download order for [facility] to download her machine ASAP and send the report to [place]. She has been given a F/U sleep appointment for 01/07/16 at 1:45.

## 2015-11-14 NOTE — Telephone Encounter (Signed)
Returned call to patient she stated she needs settings changed on CPAP.Spoke to [place] she will order a [place].Pt requested sleep clinic appointment in May.Message sent to [facility].

## 2015-11-14 NOTE — Progress Notes (Signed)
Diabetes Self-Management Education  Visit Type: First/Initial  Appt. Start Time: 1530 Appt. End Time: 1700  11/14/2015  [name], identified by name and date of birth, is a 70 y.o. female with a diagnosis of Diabetes: Type 2. She has had pre-diabetes in the past. This past year she needed steroid treatment for lymphocytic colitis which affected her BG. She is now starting on Metformin and has a meter. She has done some research on Glycemic Index and is interested in more information on that.  ASSESSMENT  There were no vitals taken for this visit.  Patient declined when asked. There is no weight on file to calculate BMI.      Diabetes Self-Management Education - 11/14/15 1531    Visit Information   Visit Type First/Initial   Initial Visit   Diabetes Type Type 2   Are you currently following a meal plan? Yes   What type of meal plan do you follow? Low Carb   Are you taking your medications as prescribed? Yes   Date Diagnosed 10/2015   Health Coping   How would you rate your overall health? Good   Psychosocial Assessment   Patient Belief/Attitude about Diabetes Motivated to manage diabetes   Self-care barriers None   Other persons present Patient   Patient Concerns Nutrition/Meal planning   Special Needs None   Learning Readiness Change in progress   How often do you need to have someone help you when you read instructions, pamphlets, or other written materials from your doctor or pharmacy? 1 - Never   What is the last grade level you completed in school? Grad School   Complications   Last HgB A1C per patient/outside source 6.8 %   How often do you check your blood sugar? 1-2 times/day   Have you had a dilated eye exam in the past 12 months? Yes   Have you had a dental exam in the past 12 months? Yes   Are you checking your feet? Yes   How many days per week are you checking your feet? 7   Dietary Intake   Breakfast black coffee and water   Snack (morning) herbal tea,  broth or something similar   Lunch small salad with vegetable soup OR 2 oz chicken breast, salad, 7 olives,    Snack (afternoon) fresh fruit   Dinner lean meat, vegetables, occasionally starch   Beverage(s) 3 oz wine,    Exercise   Exercise Type Light (walking / raking leaves)  recumbant stationary bike    How many days per week to you exercise? 3   How many minutes per day do you exercise? 30   Total minutes per week of exercise 90   Patient Education   Previous Diabetes Education Yes (please comment)  for pre-diabetes in 2015   Disease state  Definition of diabetes, type 1 and 2, and the diagnosis of diabetes   Nutrition management  Carbohydrate counting;Food label reading, portion sizes and measuring food.;Role of diet in the treatment of diabetes and the relationship between the three main macronutrients and blood glucose level   Physical activity and exercise  Role of exercise on diabetes management, blood pressure control and cardiac health.   Medications Reviewed patients medication for diabetes, action, purpose, timing of dose and side effects.  Cautioned her about diarrhea and how to prevent it    Monitoring Taught/evaluated SMBG meter.;Identified appropriate SMBG and/or A1C goals.   Individualized Goals (developed by patient)   Nutrition Follow meal  plan discussed   Physical Activity Exercise 3-5 times per week   Medications take my medication as prescribed   Monitoring  test my blood glucose as discussed   Outcomes   Expected Outcomes Demonstrated interest in learning. Expect positive outcomes   Future DMSE PRN   Program Status Completed     Discussed Glycemic Index and I provided her with a list of higher glycemic foods per her request. Individualized Plan for Diabetes Self-Management Training:   Learning Objective:  Patient will have a greater understanding of diabetes self-management. Patient education plan is to attend individual and/or group sessions per assessed  needs and concerns.   Plan:   Patient Instructions  Plan:  Aim for 2 Carb Choices per meal (30 grams) +/- 1 either way  Aim for 0-1 Carbs per snack if hungry  Include protein in moderation with your meals and snacks Consider reading food labels for Total Carbohydrate of foods Continue with your activity level as tolerated Consider checking BG at alternate times per day        Expected Outcomes:  Demonstrated interest in learning. Expect positive outcomes  Education material provided: Living Well with Diabetes, A1C conversion sheet, Meal plan card and Carbohydrate counting sheet  If problems or questions, patient to contact team via:  Phone and Email  Future DSME appointment: PRN

## 2015-11-26 ENCOUNTER — Encounter: Payer: Self-pay | Admitting: Internal Medicine

## 2015-11-28 ENCOUNTER — Ambulatory Visit: Payer: Medicare Other

## 2015-11-29 ENCOUNTER — Ambulatory Visit: Payer: Medicare Other | Admitting: Cardiovascular Disease

## 2015-11-29 ENCOUNTER — Ambulatory Visit: Payer: Medicare Other

## 2015-12-11 ENCOUNTER — Ambulatory Visit: Payer: Medicare Other

## 2015-12-18 ENCOUNTER — Ambulatory Visit (INDEPENDENT_AMBULATORY_CARE_PROVIDER_SITE_OTHER): Payer: Medicare Other | Admitting: Podiatry

## 2015-12-18 DIAGNOSIS — Q828 Other specified congenital malformations of skin: Secondary | ICD-10-CM | POA: Diagnosis not present

## 2015-12-18 DIAGNOSIS — B351 Tinea unguium: Secondary | ICD-10-CM

## 2015-12-18 DIAGNOSIS — M79673 Pain in unspecified foot: Secondary | ICD-10-CM | POA: Diagnosis not present

## 2015-12-18 DIAGNOSIS — E1151 Type 2 diabetes mellitus with diabetic peripheral angiopathy without gangrene: Secondary | ICD-10-CM

## 2015-12-19 DIAGNOSIS — M14679 Charcot's joint, unspecified ankle and foot: Secondary | ICD-10-CM | POA: Diagnosis not present

## 2015-12-19 DIAGNOSIS — J302 Other seasonal allergic rhinitis: Secondary | ICD-10-CM | POA: Diagnosis not present

## 2015-12-19 DIAGNOSIS — J0100 Acute maxillary sinusitis, unspecified: Secondary | ICD-10-CM | POA: Diagnosis not present

## 2015-12-19 DIAGNOSIS — E119 Type 2 diabetes mellitus without complications: Secondary | ICD-10-CM | POA: Diagnosis not present

## 2015-12-19 DIAGNOSIS — Z7984 Long term (current) use of oral hypoglycemic drugs: Secondary | ICD-10-CM | POA: Diagnosis not present

## 2015-12-19 NOTE — Progress Notes (Signed)
Subjective:     Patient ID: [name] [name], female   DOB: Aug 02, 1946, 70 y.o.   MRN: [id number]  HPI long-term diabetic with nail disease 1-5 both feet with pain in thickness that she cannot cut and lesion formation left it's painful   Review of Systems     Objective:   Physical Exam Neurovascular status diminished but unchanged from previous visit with thick yellow brittle nailbeds 1-5 both feet that are bothersome and keratotic lesion sub-third metatarsal left that's painful when pressed    Assessment:     At risk diabetic with vascular and neurological disease with thick yellow brittle nails that are painful 1-5 both feet and painful porokeratotic type lesion left    Plan:     Debride nailbeds and lesions bilateral with no iatrogenic bleeding and gave diabetic foot education and reappoint to recheck again for regular visit

## 2016-01-07 ENCOUNTER — Ambulatory Visit: Payer: Medicare Other | Admitting: Cardiovascular Disease

## 2016-01-16 DIAGNOSIS — Z79899 Other long term (current) drug therapy: Secondary | ICD-10-CM | POA: Diagnosis not present

## 2016-01-16 DIAGNOSIS — L4059 Other psoriatic arthropathy: Secondary | ICD-10-CM | POA: Diagnosis not present

## 2016-01-16 DIAGNOSIS — M1009 Idiopathic gout, multiple sites: Secondary | ICD-10-CM | POA: Diagnosis not present

## 2016-01-16 DIAGNOSIS — K5289 Other specified noninfective gastroenteritis and colitis: Secondary | ICD-10-CM | POA: Diagnosis not present

## 2016-02-03 DIAGNOSIS — M1711 Unilateral primary osteoarthritis, right knee: Secondary | ICD-10-CM | POA: Diagnosis not present

## 2016-02-14 DIAGNOSIS — R740 Nonspecific elevation of levels of transaminase and lactic acid dehydrogenase [LDH]: Secondary | ICD-10-CM | POA: Diagnosis not present

## 2016-02-14 DIAGNOSIS — E119 Type 2 diabetes mellitus without complications: Secondary | ICD-10-CM | POA: Diagnosis not present

## 2016-02-14 DIAGNOSIS — M1460 Charcot's joint, unspecified site: Secondary | ICD-10-CM | POA: Diagnosis not present

## 2016-02-14 DIAGNOSIS — Z79899 Other long term (current) drug therapy: Secondary | ICD-10-CM | POA: Diagnosis not present

## 2016-02-14 DIAGNOSIS — Z7984 Long term (current) use of oral hypoglycemic drugs: Secondary | ICD-10-CM | POA: Diagnosis not present

## 2016-02-14 DIAGNOSIS — R7309 Other abnormal glucose: Secondary | ICD-10-CM | POA: Diagnosis not present

## 2016-02-14 DIAGNOSIS — L4050 Arthropathic psoriasis, unspecified: Secondary | ICD-10-CM | POA: Diagnosis not present

## 2016-02-14 DIAGNOSIS — R945 Abnormal results of liver function studies: Secondary | ICD-10-CM | POA: Diagnosis not present

## 2016-03-13 DIAGNOSIS — K5289 Other specified noninfective gastroenteritis and colitis: Secondary | ICD-10-CM | POA: Diagnosis not present

## 2016-03-13 DIAGNOSIS — H02823 Cysts of right eye, unspecified eyelid: Secondary | ICD-10-CM | POA: Diagnosis not present

## 2016-03-18 ENCOUNTER — Ambulatory Visit (INDEPENDENT_AMBULATORY_CARE_PROVIDER_SITE_OTHER): Payer: Medicare Other | Admitting: Cardiovascular Disease

## 2016-03-18 ENCOUNTER — Encounter: Payer: Self-pay | Admitting: Cardiovascular Disease

## 2016-03-18 VITALS — BP 114/68 | HR 65 | Ht 66.5 in | Wt 225.4 lb

## 2016-03-18 DIAGNOSIS — I10 Essential (primary) hypertension: Secondary | ICD-10-CM | POA: Diagnosis not present

## 2016-03-18 DIAGNOSIS — K52832 Lymphocytic colitis: Secondary | ICD-10-CM

## 2016-03-18 DIAGNOSIS — G4733 Obstructive sleep apnea (adult) (pediatric): Secondary | ICD-10-CM | POA: Diagnosis not present

## 2016-03-18 DIAGNOSIS — E669 Obesity, unspecified: Secondary | ICD-10-CM | POA: Diagnosis not present

## 2016-03-18 NOTE — Patient Instructions (Signed)
Your physician wants you to follow-up in: 1 year in a sleep clinic. You will receive a reminder letter in the mail two months in advance. If you don't receive a letter, please call our office to schedule the follow-up appointment.

## 2016-03-20 ENCOUNTER — Encounter: Payer: Self-pay | Admitting: Cardiovascular Disease

## 2016-03-20 NOTE — Progress Notes (Signed)
Patient ID: [name], female   DOB: 25-Aug-1945, 70 y.o.   MRN: [id number]     HPI: [name] [name] is a 70 y.o. female percents in the office today for an 6 month followup sleep/cardiology evaluation.  [name] [name] is a retired [employer]  who has a history of hypertension, obesity, psoriatic arthritis, DVT, which occurred while traveling to [place], as well as complex obstructive sleep apnea.  She has had difficulty in the past with CPAP therapy. She has been able to tolerate BiPAP and has had a Respironics BiPAP Auto unit well but also has had difficulty with some of [name] [name] high pressure requirements. In April 2014 I changed [name] [name] maximum BiPAP pressure to 19 and [name] [name] maximum EPAP pressure to 15. I also reduced [name] [name] minimum EPAP pressure to 6 and [name] [name] minimal IPAP pressure to 10 with pressure support of 4 and changed [name] [name] Bi_Flex to 3. She has been followed by [facility].  Clinically, she had felt markedly improved with BiPAP.  Compared to prior CPAP therapy.  She has psoriatic arthritis and is on Humira as well as methotrexate followed by rheumatology.  She also has a Charcot joint in [name] [name] right foot.   She is on Cymbalta for pain relief relative to this. She does not routinely exercise.  She keeps busy moving around, but typically does not do aerobic activity.  She has not been successful with significant weight loss.   She recently, she has been taking lisinopril HCTZ 20/12.5 mg for hypertension.  When I lsaw herlast year, she had experienced an episode of chest discomfort which she felt was like a cramp and she noticed this most when standing up, but was associated with diaphoresis and shortness of breath.  She does have family history for coronary artery disease both in [name] [name] mother as well as in [name] [name] maternal grandmother. She has not been able to be very active due to [name] [name] Charcot foot.  She also had noticed some mild shortness of breath with activity.  She eventually was referred for a nuclear perfusion  study on 01/31/2015 which revealed normal perfusion and function; ejection fraction 64%.  [name] [name] recently had a protracted course of bronchitis.  Last year she also had a comp, located course of lymphocytic colitis for which she required steroids for 11 months.  When I last saw [name] [name] in February 2017, [name] [name] BiPAP machine had begun to fail.  She continues to use CPAP with 100% compliance and cannot sleep without it.  She typically sleeps for at least 8 hours per night.  She had taken [name] [name] BiPAP machine to [facility] who stated that [name] [name] machine essentially had stopped working and is in need for new machine.   She received a new Respironics Dream station auto BiPAP unit in April 2017.  She continues to use a Customer service manager FX full face mask, medium size.  She feels that the machine has been working well but she has had difficulty with [name] [name] sleep.  At times she feels that she is not getting enough pressure.  Previously she had been set on a BiPAP auto unit with an EPAP minimum of 8 and IPAP max of 19 , which had worked well.  I obtained a new download today from 02/16/2016 through 03/16/2016.  She is 100% compliant and is averaging 8 hours and 25 minutes of sleep.  Apparently, she is not been set on auto and has been on and IPAP of 14 and an EPAP of 10.  [name] [name] AHI is increased  at 25.1.  An Epworth Sleepiness Scale score was calculated in the office today and this endorsed at 3.  She presents for reevaluation.  Past Medical History:  Diagnosis Date  . Arthritis   . Deep vein thrombosis ([place])    right calf - 05/2012   . Diabetes mellitus without complication (HCC)    diet controlled   . GERD (gastroesophageal reflux disease)   . Hyperlipidemia   . Hypertension    Ejection fraction =>55% Left ventricular systolic function is normal. Left ventricular wall motion is normal    . Lymphocytic colitis   . Neuropathy ([place])    diabetic - in bilateral feet   . Pityriasis lichenoides chronica   . Sleep apnea    bipap     Past Surgical History:  Procedure Laterality Date  . DILATION AND CURETTAGE OF UTERUS    . HAMMER TOE SURGERY    . right hand surgery      due to blood infection   . torn meniscus repair      right knee   . TOTAL HIP ARTHROPLASTY  09/13/2012   Procedure: TOTAL HIP ARTHROPLASTY ANTERIOR APPROACH;  Surgeon: Mauri Pole, MD;  Location: [facility];  Service: Orthopedics;  Laterality: Right;    Allergies  Allergen Reactions  . Other Shortness Of Breath    Blue fish: palms and feet turn red  . Sulfa Antibiotics Hives    Current Outpatient Prescriptions  Medication Sig Dispense Refill  . adalimumab (HUMIRA PEN) 40 MG/0.8ML injection Inject 40 mg into the skin every 14 (fourteen) days. Every other week    . allopurinol (ZYLOPRIM) 100 MG tablet Take 100 mg by mouth 2 (two) times daily.    Marland Kitchen aspirin 81 MG tablet Take 81 mg by mouth daily.    . Budesonide (UCERIS) 9 MG TB24 Take 1 tablet by mouth daily.    . Cholecalciferol (VITAMIN D3) 1000 UNITS CAPS Take 1,000 Units by mouth 2 (two) times daily.     . citalopram (CELEXA) 20 MG tablet Take 20 mg by mouth daily.    . Cyanocobalamin (B-12 SL) Place 1 tablet under the tongue daily.    . cyclobenzaprine (FLEXERIL) 10 MG tablet Take 10 mg by mouth 3 (three) times daily as needed for muscle spasms.    . FOLIC ACID PO Take 1 tablet by mouth daily.     Marland Kitchen lisinopril-hydrochlorothiazide (PRINZIDE,ZESTORETIC) 20-12.5 MG per tablet Take 1 tablet by mouth every morning.    . loperamide (IMODIUM) 2 MG capsule Take 6 mg by mouth 2 (two) times daily.    Marland Kitchen MELATONIN SL Place 2 tablets under the tongue at bedtime.     . methotrexate (RHEUMATREX) 2.5 MG tablet Take 20 mg by mouth once a week. Caution:Chemotherapy. Protect from light. 8 tablets, once a week    . mometasone (ELOCON) 0.1 % cream Apply 1 application topically daily as needed. For psoriasis.    . mupirocin cream (BACTROBAN) 2 % Apply 1 application topically 2 (two) times daily as needed. For  psoriasis.    Marland Kitchen omeprazole (PRILOSEC) 40 MG capsule Take 40 mg by mouth at bedtime.    Marland Kitchen PRESCRIPTION MEDICATION Place 3 drops into both ears 2 (two) times daily as needed. Fluocinolone 0.01% Ear Drops. For inflammation and itching of the ear.    . Probiotic Product (ALIGN PO) Take 1 capsule by mouth daily.    . sucralfate (CARAFATE) 1 GM/10ML suspension Take 10 mLs (1 g total) by mouth 4 (  four) times daily -  with meals and at bedtime. 420 mL 0   No current facility-administered medications for this visit.     Socially she is widowed. She has 2 children and 2 grandchildren. [name] [name] daughter is a Engineer, drilling. There is no tobacco use. She does drink occasional alcohol.  ROS General: Negative; No fevers, chills, or night sweats;  HEENT: Negative; No changes in vision or hearing, sinus congestion, difficulty swallowing Pulmonary: Negative; No cough, wheezing, shortness of breath, hemoptysis Cardiovascular: Negative; No chest pain, presyncope, syncope, palpitations GI: History of lymphocytic colitis GU: Negative; No dysuria, hematuria, or difficulty voiding Musculoskeletal: Positive for R foot charcot joint; no myalgias, joint pain, or weakness Hematologic/Oncology: Negative; no easy bruising, bleeding Rheumatologic: Psoriatic arthritis;  History of gout Endocrine: Negative; no heat/cold intolerance; no diabetes Neuro: Negative; no changes in balance, headaches Skin: Negative; No rashes or skin lesions Psychiatric: Negative; No behavioral problems, depression Sleep: Positive for complex sleep apnea on BiPAP therapy;  No residual snoring, daytime sleepiness, hypersomnolence, bruxism, restless legs, hypnogognic hallucinations, no cataplexy Other comprehensive 14 point system review is negative.  PE BP 114/68   Pulse 65   Ht 5' 6.5" (1.689 m)   Wt 225 lb 6.4 oz (102.2 kg)   BMI 35.84 kg/m    Wt Readings from Last 3 Encounters:  03/18/16 225 lb 6.4 oz (102.2 kg)  10/02/15 238 lb (108 kg)    01/30/15 237 lb (107.5 kg)   General: Alert, oriented, no distress.  Skin: normal turgor, no rashes HEENT: Normocephalic, atraumatic. Pupils round and reactive; sclera anicteric;no lid lag.  Nose without nasal septal hypertrophy Mouth/Parynx benign; Mallinpatti scale 3 Neck: No JVD, no carotid bruits with normal carotid upstroke Lungs: clear to ausculatation and percussion; no wheezing or rales Chest wall:  Positive for costochondral tenderness along the left sternal border Heart: RRR, s1 s2 normal 1/6 systolic murmur,  No diastolic murmur.  No rubs thrills or heaves. Abdomen: soft, nontender; no hepatosplenomehaly, BS+; abdominal aorta nontender and not dilated by palpation. Back: No CVA tenderness Pulses 2+ Extremities: no clubbing cyanosis or edema, Homan's sign negative  Neurologic: grossly nonfocal Psychological: Normal affect and mood  No ECG was done today.  June 2016 ECG (independently read by me):  Normal sinus rhythm with right bundle branch block.  October 2015ECG (independently read by me): Normal sinus rhythm at 71 beats per minute right bundle branch block with repolarization changes.  Normal intervals.  Prior July 2014 ECG sinus rhythm with incomplete right bundle branch block. QRS duration 118 ms. Nonspecific ST-T changes.  LABS: BMP Latest Ref Rng & Units 09/14/2012 09/06/2012  Glucose 70 - 99 mg/dL 181(H) 108(H)  BUN 6 - 23 mg/dL 16 16  Creatinine 0.50 - 1.10 mg/dL 0.69 0.77  Sodium 135 - 145 mEq/L 131(L) 135  Potassium 3.5 - 5.1 mEq/L 4.5 4.2  Chloride 96 - 112 mEq/L 97 97  CO2 19 - 32 mEq/L 27 28  Calcium 8.4 - 10.5 mg/dL 8.6 9.8   No flowsheet data found. CBC Latest Ref Rng & Units 09/14/2012 09/06/2012  WBC 4.0 - 10.5 K/uL 13.7(H) 7.0  Hemoglobin 12.0 - 15.0 g/dL 10.4(L) 14.0  Hematocrit 36.0 - 46.0 % 29.8(L) 41.5  Platelets 150 - 400 K/uL 181 229   Lab Results  Component Value Date   MCV 92.8 09/14/2012   MCV 94.3 09/06/2012   No results found for:  TSH No results found for: HGBA1C  Lipid Panel  No results found for: CHOL,  TRIG, HDL, CHOLHDL, VLDL, LDLCALC, LDLDIRECT  RADIOLOGY: No results found.    ASSESSMENT AND PLAN: [name] [name] is a 70 year old white female who has a history of hypertension, obesity, psoriatic arthritis, complex sleep apnea, as well as a remote history of DVT.  [name] [name] blood pressure today is well controlled on lisinopril HCT 20/12.5. She has lost weight since she was last seen [name] [name] recently had a bout of lymphocytic colitis requiring recurrent steroid use and she has gained some of [name] [name] 20 found.  Bowel weight loss back.  She is seeing [name] for rheumatology and Dr. Cristina Gong for [name] [name] lymphocytic colitis.  [name] [name] body mass index is 35 kg/m squared compatible with moderate obesity.  Last year she had experienced an episode of chest pain.  A nuclear perfusion study was entirely normal.  She has received a new BiPAP machine, which she believes is much quieter than [name] [name] old unit.  However, she has been set on a fixed 14/10 pressure which is not adequate such that [name] [name] AHI on download was elevated at 25.1.  I have recommended that we change [name] [name] from a fixed BiPAP mode to auto BiPAP initiating at 10/6 with potential titration up to 20/16.  I will also allow for pressure support to change from 4 to 6 as needed.  I will also initiate Bi-flex at 3.  A new download will be obtained in one month to assess these changes.  We will contact [name] [name] DME company and she if adjustments need to be made.  As long as she remains stable I will see [name] [name] in one year for follow-up cardiology and sleep evaluation..  Time spent: 25 minutes  Troy Sine, MD, [facility]  03/20/2016 10:30 PM

## 2016-03-26 ENCOUNTER — Other Ambulatory Visit: Payer: Medicare Other

## 2016-04-20 DIAGNOSIS — D2311 Other benign neoplasm of skin of right eyelid, including canthus: Secondary | ICD-10-CM | POA: Diagnosis not present

## 2016-04-23 ENCOUNTER — Ambulatory Visit (INDEPENDENT_AMBULATORY_CARE_PROVIDER_SITE_OTHER): Payer: Medicare Other | Admitting: Podiatry

## 2016-04-23 DIAGNOSIS — B351 Tinea unguium: Secondary | ICD-10-CM

## 2016-04-23 DIAGNOSIS — M79673 Pain in unspecified foot: Secondary | ICD-10-CM

## 2016-04-27 DIAGNOSIS — E871 Hypo-osmolality and hyponatremia: Secondary | ICD-10-CM | POA: Diagnosis not present

## 2016-04-27 DIAGNOSIS — Z23 Encounter for immunization: Secondary | ICD-10-CM | POA: Diagnosis not present

## 2016-04-27 DIAGNOSIS — L4050 Arthropathic psoriasis, unspecified: Secondary | ICD-10-CM | POA: Diagnosis not present

## 2016-04-27 DIAGNOSIS — R7301 Impaired fasting glucose: Secondary | ICD-10-CM | POA: Diagnosis not present

## 2016-04-27 DIAGNOSIS — K5289 Other specified noninfective gastroenteritis and colitis: Secondary | ICD-10-CM | POA: Diagnosis not present

## 2016-04-27 DIAGNOSIS — R252 Cramp and spasm: Secondary | ICD-10-CM | POA: Diagnosis not present

## 2016-05-01 DIAGNOSIS — M1711 Unilateral primary osteoarthritis, right knee: Secondary | ICD-10-CM | POA: Diagnosis not present

## 2016-05-04 DIAGNOSIS — R8290 Unspecified abnormal findings in urine: Secondary | ICD-10-CM | POA: Diagnosis not present

## 2016-05-04 DIAGNOSIS — Z0000 Encounter for general adult medical examination without abnormal findings: Secondary | ICD-10-CM | POA: Diagnosis not present

## 2016-05-04 DIAGNOSIS — Z1389 Encounter for screening for other disorder: Secondary | ICD-10-CM | POA: Diagnosis not present

## 2016-05-07 DIAGNOSIS — Z79899 Other long term (current) drug therapy: Secondary | ICD-10-CM | POA: Diagnosis not present

## 2016-05-07 DIAGNOSIS — L408 Other psoriasis: Secondary | ICD-10-CM | POA: Diagnosis not present

## 2016-05-07 DIAGNOSIS — K5289 Other specified noninfective gastroenteritis and colitis: Secondary | ICD-10-CM | POA: Diagnosis not present

## 2016-05-07 DIAGNOSIS — L4059 Other psoriatic arthropathy: Secondary | ICD-10-CM | POA: Diagnosis not present

## 2016-05-07 DIAGNOSIS — M1009 Idiopathic gout, multiple sites: Secondary | ICD-10-CM | POA: Diagnosis not present

## 2016-05-07 DIAGNOSIS — M2550 Pain in unspecified joint: Secondary | ICD-10-CM | POA: Diagnosis not present

## 2016-05-08 DIAGNOSIS — M1711 Unilateral primary osteoarthritis, right knee: Secondary | ICD-10-CM | POA: Diagnosis not present

## 2016-05-08 NOTE — Progress Notes (Signed)
Subjective:     Patient ID: [name] [name], female   DOB: December 07, 1945, 70 y.o.   MRN: [id number]  HPI patient presents with thick yellow brittle nailbeds 1-5 both feet that are painful   Review of Systems     Objective:   Physical Exam Thick yellow brittle nailbeds 1-5 both feet incurvated in the corners and painful    Assessment:     Mycotic nail infection with pain 1-5 both feet    Plan:     Debridement nailbeds 1-5 both feet with no iatrogenic bleeding noted

## 2016-05-18 DIAGNOSIS — M1711 Unilateral primary osteoarthritis, right knee: Secondary | ICD-10-CM | POA: Diagnosis not present

## 2016-05-20 DIAGNOSIS — Z79899 Other long term (current) drug therapy: Secondary | ICD-10-CM | POA: Diagnosis not present

## 2016-06-19 DIAGNOSIS — J069 Acute upper respiratory infection, unspecified: Secondary | ICD-10-CM | POA: Diagnosis not present

## 2016-06-19 DIAGNOSIS — J340 Abscess, furuncle and carbuncle of nose: Secondary | ICD-10-CM | POA: Diagnosis not present

## 2016-06-22 DIAGNOSIS — R51 Headache: Secondary | ICD-10-CM | POA: Diagnosis not present

## 2016-06-22 DIAGNOSIS — L03211 Cellulitis of face: Secondary | ICD-10-CM | POA: Diagnosis not present

## 2016-06-24 DIAGNOSIS — L729 Follicular cyst of the skin and subcutaneous tissue, unspecified: Secondary | ICD-10-CM | POA: Diagnosis not present

## 2016-06-29 DIAGNOSIS — R152 Fecal urgency: Secondary | ICD-10-CM | POA: Diagnosis not present

## 2016-06-29 DIAGNOSIS — K5289 Other specified noninfective gastroenteritis and colitis: Secondary | ICD-10-CM | POA: Diagnosis not present

## 2016-06-29 DIAGNOSIS — R159 Full incontinence of feces: Secondary | ICD-10-CM | POA: Diagnosis not present

## 2016-07-10 DIAGNOSIS — N3000 Acute cystitis without hematuria: Secondary | ICD-10-CM | POA: Diagnosis not present

## 2016-07-10 DIAGNOSIS — R300 Dysuria: Secondary | ICD-10-CM | POA: Diagnosis not present

## 2016-07-16 DIAGNOSIS — R6883 Chills (without fever): Secondary | ICD-10-CM | POA: Diagnosis not present

## 2016-07-16 DIAGNOSIS — R300 Dysuria: Secondary | ICD-10-CM | POA: Diagnosis not present

## 2016-07-16 DIAGNOSIS — N390 Urinary tract infection, site not specified: Secondary | ICD-10-CM | POA: Diagnosis not present

## 2016-07-28 ENCOUNTER — Other Ambulatory Visit: Payer: Medicare Other

## 2016-07-28 DIAGNOSIS — L729 Follicular cyst of the skin and subcutaneous tissue, unspecified: Secondary | ICD-10-CM | POA: Diagnosis not present

## 2016-08-05 ENCOUNTER — Encounter (INDEPENDENT_AMBULATORY_CARE_PROVIDER_SITE_OTHER): Payer: Medicare Other | Admitting: Podiatry

## 2016-08-05 DIAGNOSIS — B351 Tinea unguium: Secondary | ICD-10-CM

## 2016-08-12 DIAGNOSIS — M2550 Pain in unspecified joint: Secondary | ICD-10-CM | POA: Diagnosis not present

## 2016-08-12 DIAGNOSIS — Z79899 Other long term (current) drug therapy: Secondary | ICD-10-CM | POA: Diagnosis not present

## 2016-08-12 DIAGNOSIS — L4059 Other psoriatic arthropathy: Secondary | ICD-10-CM | POA: Diagnosis not present

## 2016-08-12 DIAGNOSIS — M1009 Idiopathic gout, multiple sites: Secondary | ICD-10-CM | POA: Diagnosis not present

## 2016-08-12 DIAGNOSIS — K5289 Other specified noninfective gastroenteritis and colitis: Secondary | ICD-10-CM | POA: Diagnosis not present

## 2016-08-24 NOTE — Progress Notes (Signed)
This encounter was created in error - please disregard.

## 2016-09-04 DIAGNOSIS — Z01419 Encounter for gynecological examination (general) (routine) without abnormal findings: Secondary | ICD-10-CM | POA: Diagnosis not present

## 2016-09-04 DIAGNOSIS — Z124 Encounter for screening for malignant neoplasm of cervix: Secondary | ICD-10-CM | POA: Diagnosis not present

## 2016-09-04 DIAGNOSIS — R300 Dysuria: Secondary | ICD-10-CM | POA: Diagnosis not present

## 2016-09-13 ENCOUNTER — Telehealth: Payer: Medicare Other | Admitting: Family

## 2016-09-13 DIAGNOSIS — R6889 Other general symptoms and signs: Secondary | ICD-10-CM

## 2016-09-13 MED ORDER — OSELTAMIVIR PHOSPHATE 75 MG PO CAPS: 75.0000 mg | ORAL_CAPSULE | Freq: Two times a day (BID) | ORAL | 0 refills | Status: DC

## 2016-09-13 NOTE — Progress Notes (Signed)

## 2016-09-15 DIAGNOSIS — J029 Acute pharyngitis, unspecified: Secondary | ICD-10-CM | POA: Diagnosis not present

## 2016-09-15 DIAGNOSIS — J020 Streptococcal pharyngitis: Secondary | ICD-10-CM | POA: Diagnosis not present

## 2016-09-23 ENCOUNTER — Ambulatory Visit (INDEPENDENT_AMBULATORY_CARE_PROVIDER_SITE_OTHER): Payer: Medicare Other | Admitting: Podiatry

## 2016-09-23 DIAGNOSIS — L84 Corns and callosities: Secondary | ICD-10-CM

## 2016-09-23 DIAGNOSIS — B351 Tinea unguium: Secondary | ICD-10-CM | POA: Diagnosis not present

## 2016-09-23 DIAGNOSIS — Q828 Other specified congenital malformations of skin: Secondary | ICD-10-CM

## 2016-09-23 DIAGNOSIS — M79674 Pain in right toe(s): Secondary | ICD-10-CM

## 2016-09-24 NOTE — Progress Notes (Signed)
Subjective:     Patient ID: [name] [name], female   DOB: 1946-03-26, 71 y.o.   MRN: [id number]  HPI patient presents with nail disease 1-5 both feet with thick debris and also keratotic lesions both feet with long-term diabetes   Review of Systems     Objective:   Physical Exam Neurovascular status intact with thick yellow brittle nailbeds 1-5 both feet and lesions of both feet that are painful    Assessment:     Mycotic nail infection with pain 1-5 both feet with lesions bilateral    Plan:     Debride painful nailbeds 1-5 both feet with no iatrogenic bleeding and lesions with no iatrogenic bleeding

## 2016-09-25 ENCOUNTER — Other Ambulatory Visit (HOSPITAL_COMMUNITY): Payer: Self-pay | Admitting: Gastroenterology

## 2016-09-25 DIAGNOSIS — K5289 Other specified noninfective gastroenteritis and colitis: Secondary | ICD-10-CM | POA: Diagnosis not present

## 2016-09-25 DIAGNOSIS — R1319 Other dysphagia: Secondary | ICD-10-CM

## 2016-09-25 DIAGNOSIS — R1313 Dysphagia, pharyngeal phase: Secondary | ICD-10-CM | POA: Diagnosis not present

## 2016-10-01 ENCOUNTER — Ambulatory Visit (HOSPITAL_COMMUNITY): Payer: Medicare Other

## 2016-10-01 ENCOUNTER — Other Ambulatory Visit (HOSPITAL_COMMUNITY): Payer: Medicare Other

## 2016-10-01 DIAGNOSIS — I788 Other diseases of capillaries: Secondary | ICD-10-CM | POA: Diagnosis not present

## 2016-10-01 DIAGNOSIS — L814 Other melanin hyperpigmentation: Secondary | ICD-10-CM | POA: Diagnosis not present

## 2016-10-01 DIAGNOSIS — M71371 Other bursal cyst, right ankle and foot: Secondary | ICD-10-CM | POA: Diagnosis not present

## 2016-10-01 DIAGNOSIS — D225 Melanocytic nevi of trunk: Secondary | ICD-10-CM | POA: Diagnosis not present

## 2016-10-01 DIAGNOSIS — D485 Neoplasm of uncertain behavior of skin: Secondary | ICD-10-CM | POA: Diagnosis not present

## 2016-10-01 DIAGNOSIS — D2372 Other benign neoplasm of skin of left lower limb, including hip: Secondary | ICD-10-CM | POA: Diagnosis not present

## 2016-10-01 DIAGNOSIS — D1801 Hemangioma of skin and subcutaneous tissue: Secondary | ICD-10-CM | POA: Diagnosis not present

## 2016-10-01 DIAGNOSIS — L821 Other seborrheic keratosis: Secondary | ICD-10-CM | POA: Diagnosis not present

## 2016-10-01 DIAGNOSIS — L400 Psoriasis vulgaris: Secondary | ICD-10-CM | POA: Diagnosis not present

## 2016-10-01 DIAGNOSIS — L570 Actinic keratosis: Secondary | ICD-10-CM | POA: Diagnosis not present

## 2016-10-02 ENCOUNTER — Ambulatory Visit (HOSPITAL_COMMUNITY)
Admission: RE | Admit: 2016-10-02 | Discharge: 2016-10-02 | Disposition: A | Discharge status: Home / Self Care | Payer: Medicare Other | Source: Ambulatory Visit | Attending: Gastroenterology | Admitting: Gastroenterology

## 2016-10-02 DIAGNOSIS — E785 Hyperlipidemia, unspecified: Secondary | ICD-10-CM | POA: Insufficient documentation

## 2016-10-02 DIAGNOSIS — M1990 Unspecified osteoarthritis, unspecified site: Secondary | ICD-10-CM | POA: Diagnosis not present

## 2016-10-02 DIAGNOSIS — L410 Pityriasis lichenoides et varioliformis acuta: Secondary | ICD-10-CM | POA: Insufficient documentation

## 2016-10-02 DIAGNOSIS — E1142 Type 2 diabetes mellitus with diabetic polyneuropathy: Secondary | ICD-10-CM | POA: Diagnosis not present

## 2016-10-02 DIAGNOSIS — E1140 Type 2 diabetes mellitus with diabetic neuropathy, unspecified: Secondary | ICD-10-CM | POA: Insufficient documentation

## 2016-10-02 DIAGNOSIS — G4733 Obstructive sleep apnea (adult) (pediatric): Secondary | ICD-10-CM | POA: Insufficient documentation

## 2016-10-02 DIAGNOSIS — Z86718 Personal history of other venous thrombosis and embolism: Secondary | ICD-10-CM | POA: Insufficient documentation

## 2016-10-02 DIAGNOSIS — I10 Essential (primary) hypertension: Secondary | ICD-10-CM | POA: Insufficient documentation

## 2016-10-02 DIAGNOSIS — Z96641 Presence of right artificial hip joint: Secondary | ICD-10-CM | POA: Diagnosis not present

## 2016-10-02 DIAGNOSIS — R1319 Other dysphagia: Secondary | ICD-10-CM | POA: Diagnosis not present

## 2016-10-02 DIAGNOSIS — K219 Gastro-esophageal reflux disease without esophagitis: Secondary | ICD-10-CM | POA: Insufficient documentation

## 2016-10-02 DIAGNOSIS — F458 Other somatoform disorders: Secondary | ICD-10-CM | POA: Insufficient documentation

## 2016-10-02 DIAGNOSIS — R05 Cough: Secondary | ICD-10-CM | POA: Diagnosis not present

## 2016-10-02 NOTE — Progress Notes (Signed)
Objective Swallowing Evaluation: Type of Study: MBS-Modified Barium Swallow Study  Patient Details  Name: [name] [name] MRN: [id number] Date of Birth: 08-Jun-1946  Today's Date: 10/02/2016 Time: SLP Start Time (ACUTE ONLY): 1103-SLP Stop Time (ACUTE ONLY): 1124 SLP Time Calculation (min) (ACUTE ONLY): 21 min  Past Medical History:  Past Medical History:  Diagnosis Date  . Arthritis   . Deep vein thrombosis ([place])    right calf - 05/2012   . Diabetes mellitus without complication (HCC)    diet controlled   . GERD (gastroesophageal reflux disease)   . Hyperlipidemia   . Hypertension    Ejection fraction =>55% Left ventricular systolic function is normal. Left ventricular wall motion is normal    . Lymphocytic colitis   . Neuropathy ([place])    diabetic - in bilateral feet   . Pityriasis lichenoides chronica   . Sleep apnea    bipap   Past Surgical History:  Past Surgical History:  Procedure Laterality Date  . DILATION AND CURETTAGE OF UTERUS    . HAMMER TOE SURGERY    . right hand surgery      due to blood infection   . torn meniscus repair      right knee   . TOTAL HIP ARTHROPLASTY  09/13/2012   Procedure: TOTAL HIP ARTHROPLASTY ANTERIOR APPROACH;  Surgeon: Mauri Pole, MD;  Location: [facility];  Service: Orthopedics;  Laterality: Right;   HPI: Ms. Preiser was referred by Dr. Cristina Gong, her GI. Hx of acid reflux, OSA, and arthritis. Complaints of difficulty swallowing, changes in voice/speech, globus sensation, throat clearing/coughing after meals, food sticking in throat, and intermittent painful swallowing. Difficulties started about 2 months ago.   No Data Recorded   Assessment / Plan / Recommendation  CHL IP CLINICAL IMPRESSIONS 10/02/2016  Clinical Impression Ms. Yoffe oral and pharyngeal swallowing mechanism was [facility]. One instance of flash penetration with consecutive sips of thin liquids via straw was noted (normal for this to occur with larger volume and straw). Pt complained of   barium pill getting "stuck" in her throat. MBS revealed that pill was lodged at the level of the CP segment for a brief moment; esophageal scan showed that pill cleared effectively and SLP provided pt with visualization of MBS screen to reaffirm that pill was gone. Educated pt re: globus sensation with pill. Recommended that pt f/u with GI for her esophageal concerns; informed Ms. Harper that she did not exhibit an oral or pharyngeal dysphagia and that her swallowing function was WNL. Advised pt to stay upright after meals to mitigate symptoms of reflux. Recommended regular solids, thin liquids, meds whole with liquid. No ST treatment needed at this time.   SLP Visit Diagnosis Dysphagia, unspecified (R13.10)  Attention and concentration deficit following --  Frontal lobe and executive function deficit following --  Impact on safety and function Mild aspiration risk      CHL IP TREATMENT RECOMMENDATION 10/02/2016  Treatment Recommendations No treatment recommended at this time     No flowsheet data found.  CHL IP DIET RECOMMENDATION 10/02/2016  SLP Diet Recommendations Regular solids;Thin liquid  Liquid Administration via Cup;Straw  Medication Administration Whole meds with liquid  Compensations Slow rate;Small sips/bites  Postural Changes Seated upright at 90 degrees;Remain semi-upright after after feeds/meals (Comment)      CHL IP OTHER RECOMMENDATIONS 10/02/2016  Recommended Consults --  Oral Care Recommendations Oral care BID  Other Recommendations --      CHL IP FOLLOW UP RECOMMENDATIONS 10/02/2016  Follow up Recommendations None      No flowsheet data found.         CHL IP ORAL PHASE 10/02/2016  Oral Phase WFL  Oral - Pudding Teaspoon --  Oral - Pudding Cup --  Oral - Honey Teaspoon --  Oral - Honey Cup --  Oral - Nectar Teaspoon --  Oral - Nectar Cup --  Oral - Nectar Straw --  Oral - Thin Teaspoon --  Oral - Thin Cup --  Oral - Thin Straw --  Oral - Puree --  Oral -  Mech Soft --  Oral - Regular --  Oral - Multi-Consistency --  Oral - Pill --  Oral Phase - Comment --    CHL IP PHARYNGEAL PHASE 10/02/2016  Pharyngeal Phase Impaired  Pharyngeal- Pudding Teaspoon --  Pharyngeal --  Pharyngeal- Pudding Cup --  Pharyngeal --  Pharyngeal- Honey Teaspoon --  Pharyngeal --  Pharyngeal- Honey Cup --  Pharyngeal --  Pharyngeal- Nectar Teaspoon --  Pharyngeal --  Pharyngeal- Nectar Cup --  Pharyngeal --  Pharyngeal- Nectar Straw --  Pharyngeal --  Pharyngeal- Thin Teaspoon --  Pharyngeal --  Pharyngeal- Thin Cup WFL  Pharyngeal --  Pharyngeal- Thin Straw Penetration/Aspiration during swallow  Pharyngeal Material enters airway, remains ABOVE vocal cords then ejected out  Pharyngeal- Puree --  Pharyngeal --  Pharyngeal- Mechanical Soft --  Pharyngeal --  Pharyngeal- Regular WFL  Pharyngeal --  Pharyngeal- Multi-consistency --  Pharyngeal --  Pharyngeal- Pill WFL  Pharyngeal --  Pharyngeal Comment --     CHL IP CERVICAL ESOPHAGEAL PHASE 10/02/2016  Cervical Esophageal Phase Impaired  Pudding Teaspoon --  Pudding Cup --  Honey Teaspoon --  Honey Cup --  Nectar Teaspoon --  Nectar Cup --  Nectar Straw --  Thin Teaspoon --  Thin Cup --  Thin Straw --  Puree --  Mechanical Soft --  Regular --  Multi-consistency --  Pill Other (Comment)  Cervical Esophageal Comment --    CHL IP GO 10/02/2016  Functional Assessment Tool Used clinical judgement  Functional Limitations Swallowing  Swallow Current Status [id number]) [place]  Swallow Goal Status [id number]) [place]  Swallow Discharge Status [id number]) [place]  Motor Speech Current Status [id number]) (None)  Motor Speech Goal Status [id number]) (None)  Motor Speech Goal Status [id number]) (None)  Spoken Language Comprehension Current Status [id number]) (None)  Spoken Language Comprehension Goal Status [id number]) (None)  Spoken Language Comprehension Discharge Status [id number]) (None)  Spoken Language Expression Current Status  [id number]) (None)  Spoken Language Expression Goal Status [id number]) (None)  Spoken Language Expression Discharge Status [phone]) (None)  Attention Current Status [id number]) (None)  Attention Goal Status [id number]) (None)  Attention Discharge Status [id number]) (None)  Memory Current Status [id number]) (None)  Memory Goal Status [id number]) (None)  Memory Discharge Status [id number]) (None)  Voice Current Status [id number]) (None)  Voice Goal Status [id number]) (None)  Voice Discharge Status [id number]) (None)  Other Speech-Language Pathology Functional Limitation [id number]) (None)  Other Speech-Language Pathology Functional Limitation Goal Status [id number]) (None)  Other Speech-Language Pathology Functional Limitation Discharge Status [id number]) (None)   Completed by Fransisca Kaufmann, SLP student Shirell Struthers, Katherene Ponto 10/02/2016, 1:17 PM

## 2016-11-02 DIAGNOSIS — E119 Type 2 diabetes mellitus without complications: Secondary | ICD-10-CM | POA: Diagnosis not present

## 2016-11-02 DIAGNOSIS — H26492 Other secondary cataract, left eye: Secondary | ICD-10-CM | POA: Diagnosis not present

## 2016-11-02 DIAGNOSIS — H5213 Myopia, bilateral: Secondary | ICD-10-CM | POA: Diagnosis not present

## 2016-11-02 DIAGNOSIS — R300 Dysuria: Secondary | ICD-10-CM | POA: Diagnosis not present

## 2016-11-02 DIAGNOSIS — H43813 Vitreous degeneration, bilateral: Secondary | ICD-10-CM | POA: Diagnosis not present

## 2016-11-12 DIAGNOSIS — K5289 Other specified noninfective gastroenteritis and colitis: Secondary | ICD-10-CM | POA: Diagnosis not present

## 2016-11-12 DIAGNOSIS — Z79899 Other long term (current) drug therapy: Secondary | ICD-10-CM | POA: Diagnosis not present

## 2016-11-12 DIAGNOSIS — M2550 Pain in unspecified joint: Secondary | ICD-10-CM | POA: Diagnosis not present

## 2016-11-12 DIAGNOSIS — L4059 Other psoriatic arthropathy: Secondary | ICD-10-CM | POA: Diagnosis not present

## 2016-11-12 DIAGNOSIS — M1009 Idiopathic gout, multiple sites: Secondary | ICD-10-CM | POA: Diagnosis not present

## 2016-11-13 DIAGNOSIS — N390 Urinary tract infection, site not specified: Secondary | ICD-10-CM | POA: Diagnosis not present

## 2016-11-13 DIAGNOSIS — F419 Anxiety disorder, unspecified: Secondary | ICD-10-CM | POA: Diagnosis not present

## 2016-11-13 DIAGNOSIS — R7303 Prediabetes: Secondary | ICD-10-CM | POA: Diagnosis not present

## 2016-11-13 DIAGNOSIS — L4050 Arthropathic psoriasis, unspecified: Secondary | ICD-10-CM | POA: Diagnosis not present

## 2016-11-13 DIAGNOSIS — E669 Obesity, unspecified: Secondary | ICD-10-CM | POA: Diagnosis not present

## 2016-11-13 DIAGNOSIS — M1A09X0 Idiopathic chronic gout, multiple sites, without tophus (tophi): Secondary | ICD-10-CM | POA: Diagnosis not present

## 2016-11-13 DIAGNOSIS — Z6838 Body mass index (BMI) 38.0-38.9, adult: Secondary | ICD-10-CM | POA: Diagnosis not present

## 2016-11-18 DIAGNOSIS — R300 Dysuria: Secondary | ICD-10-CM | POA: Diagnosis not present

## 2016-11-18 DIAGNOSIS — N3020 Other chronic cystitis without hematuria: Secondary | ICD-10-CM | POA: Diagnosis not present

## 2016-11-19 DIAGNOSIS — G8929 Other chronic pain: Secondary | ICD-10-CM | POA: Diagnosis not present

## 2016-11-19 DIAGNOSIS — M25561 Pain in right knee: Secondary | ICD-10-CM | POA: Diagnosis not present

## 2016-11-19 DIAGNOSIS — M1711 Unilateral primary osteoarthritis, right knee: Secondary | ICD-10-CM | POA: Diagnosis not present

## 2016-12-14 ENCOUNTER — Ambulatory Visit (INDEPENDENT_AMBULATORY_CARE_PROVIDER_SITE_OTHER): Payer: Medicare Other | Admitting: Podiatry

## 2016-12-14 ENCOUNTER — Encounter: Payer: Self-pay | Admitting: Podiatry

## 2016-12-14 DIAGNOSIS — M79605 Pain in left leg: Secondary | ICD-10-CM | POA: Diagnosis not present

## 2016-12-14 DIAGNOSIS — M79604 Pain in right leg: Secondary | ICD-10-CM

## 2016-12-14 DIAGNOSIS — L03032 Cellulitis of left toe: Secondary | ICD-10-CM

## 2016-12-14 DIAGNOSIS — B351 Tinea unguium: Secondary | ICD-10-CM | POA: Diagnosis not present

## 2016-12-14 NOTE — Patient Instructions (Signed)

## 2016-12-16 DIAGNOSIS — R252 Cramp and spasm: Secondary | ICD-10-CM | POA: Diagnosis not present

## 2016-12-16 DIAGNOSIS — E119 Type 2 diabetes mellitus without complications: Secondary | ICD-10-CM | POA: Diagnosis not present

## 2016-12-16 NOTE — Progress Notes (Signed)
Subjective:    Patient ID: [name] [name], female   DOB: 71 y.o.   MRN: [id number]   HPI patient states she went to [place] and has developed some blisters in her arch and she developed some discomfort in her left hallux with drainage on the proximal surface both medial and lateral and thick incurvated nailbeds 1-5 both feet that she's not been able to cut and are painful    ROS      Objective:  Physical Exam  No change in neurovascular status with patient found to have healed blisters and the arch bilateral with a damaged left hallux nail with dark discoloration looseness of the nailbed and proximal irritation with slight localized drainage. Patient's nails are all incurvated thick with yellow brittle debris and pain    Assessment:    Trauma from excessive activity and patient having neuropathy with damaged left hallux nail with probable infection and nail disease 1-5 both feet with pain     Plan:     H&P condition reviewed and at this time I infiltrated the left hallux 60 mg I can Marcaine mixture and using sterile instrumentation I removed the nail and toe toe and cleaned up infected proximal area which appears to be localized. I did not note any proximal spread and there was no bone exposure and I applied sterile dressing to the area after removal of necrotic tissue and drainage of the area and I then went ahead and debrided remaining nails with no iatrogenic bleeding noted

## 2016-12-23 ENCOUNTER — Ambulatory Visit: Payer: Medicare Other | Admitting: Podiatry

## 2017-01-06 ENCOUNTER — Ambulatory Visit: Payer: Medicare Other | Admitting: Cardiovascular Disease

## 2017-01-06 DIAGNOSIS — L97511 Non-pressure chronic ulcer of other part of right foot limited to breakdown of skin: Secondary | ICD-10-CM | POA: Diagnosis not present

## 2017-01-06 DIAGNOSIS — M14671 Charcot's joint, right ankle and foot: Secondary | ICD-10-CM | POA: Diagnosis not present

## 2017-01-18 DIAGNOSIS — M1711 Unilateral primary osteoarthritis, right knee: Secondary | ICD-10-CM | POA: Diagnosis not present

## 2017-01-18 DIAGNOSIS — M25561 Pain in right knee: Secondary | ICD-10-CM | POA: Diagnosis not present

## 2017-01-26 DIAGNOSIS — M1711 Unilateral primary osteoarthritis, right knee: Secondary | ICD-10-CM | POA: Diagnosis not present

## 2017-02-04 DIAGNOSIS — M1711 Unilateral primary osteoarthritis, right knee: Secondary | ICD-10-CM | POA: Diagnosis not present

## 2017-02-04 DIAGNOSIS — M25561 Pain in right knee: Secondary | ICD-10-CM | POA: Diagnosis not present

## 2017-02-05 DIAGNOSIS — R238 Other skin changes: Secondary | ICD-10-CM | POA: Diagnosis not present

## 2017-02-05 DIAGNOSIS — E119 Type 2 diabetes mellitus without complications: Secondary | ICD-10-CM | POA: Diagnosis not present

## 2017-02-05 DIAGNOSIS — N3020 Other chronic cystitis without hematuria: Secondary | ICD-10-CM | POA: Diagnosis not present

## 2017-02-05 DIAGNOSIS — M1460 Charcot's joint, unspecified site: Secondary | ICD-10-CM | POA: Diagnosis not present

## 2017-02-05 DIAGNOSIS — N390 Urinary tract infection, site not specified: Secondary | ICD-10-CM | POA: Diagnosis not present

## 2017-02-11 DIAGNOSIS — L408 Other psoriasis: Secondary | ICD-10-CM | POA: Diagnosis not present

## 2017-02-11 DIAGNOSIS — Z79899 Other long term (current) drug therapy: Secondary | ICD-10-CM | POA: Diagnosis not present

## 2017-02-11 DIAGNOSIS — L4059 Other psoriatic arthropathy: Secondary | ICD-10-CM | POA: Diagnosis not present

## 2017-02-11 DIAGNOSIS — Z6839 Body mass index (BMI) 39.0-39.9, adult: Secondary | ICD-10-CM | POA: Diagnosis not present

## 2017-02-11 DIAGNOSIS — E669 Obesity, unspecified: Secondary | ICD-10-CM | POA: Diagnosis not present

## 2017-02-11 DIAGNOSIS — K5289 Other specified noninfective gastroenteritis and colitis: Secondary | ICD-10-CM | POA: Diagnosis not present

## 2017-02-11 DIAGNOSIS — M1009 Idiopathic gout, multiple sites: Secondary | ICD-10-CM | POA: Diagnosis not present

## 2017-02-11 DIAGNOSIS — M2550 Pain in unspecified joint: Secondary | ICD-10-CM | POA: Diagnosis not present

## 2017-02-15 DIAGNOSIS — E11610 Type 2 diabetes mellitus with diabetic neuropathic arthropathy: Secondary | ICD-10-CM | POA: Diagnosis not present

## 2017-02-15 DIAGNOSIS — E11621 Type 2 diabetes mellitus with foot ulcer: Secondary | ICD-10-CM | POA: Diagnosis not present

## 2017-02-15 DIAGNOSIS — L97511 Non-pressure chronic ulcer of other part of right foot limited to breakdown of skin: Secondary | ICD-10-CM | POA: Diagnosis not present

## 2017-02-18 DIAGNOSIS — L309 Dermatitis, unspecified: Secondary | ICD-10-CM | POA: Diagnosis not present

## 2017-03-03 DIAGNOSIS — L97509 Non-pressure chronic ulcer of other part of unspecified foot with unspecified severity: Secondary | ICD-10-CM | POA: Diagnosis not present

## 2017-03-03 DIAGNOSIS — E11621 Type 2 diabetes mellitus with foot ulcer: Secondary | ICD-10-CM | POA: Diagnosis not present

## 2017-03-03 DIAGNOSIS — E11610 Type 2 diabetes mellitus with diabetic neuropathic arthropathy: Secondary | ICD-10-CM | POA: Diagnosis not present

## 2017-03-11 DIAGNOSIS — E11621 Type 2 diabetes mellitus with foot ulcer: Secondary | ICD-10-CM | POA: Diagnosis not present

## 2017-03-11 DIAGNOSIS — S8261XA Displaced fracture of lateral malleolus of right fibula, initial encounter for closed fracture: Secondary | ICD-10-CM | POA: Diagnosis not present

## 2017-03-11 DIAGNOSIS — L97509 Non-pressure chronic ulcer of other part of unspecified foot with unspecified severity: Secondary | ICD-10-CM | POA: Diagnosis not present

## 2017-03-11 DIAGNOSIS — M14671 Charcot's joint, right ankle and foot: Secondary | ICD-10-CM | POA: Diagnosis not present

## 2017-03-15 ENCOUNTER — Ambulatory Visit: Payer: Medicare Other

## 2017-03-19 ENCOUNTER — Emergency Department (HOSPITAL_COMMUNITY)
Admission: EM | Admit: 2017-03-19 | Discharge: 2017-03-19 | Disposition: A | Discharge status: Home / Self Care | Payer: Medicare Other | Attending: Emergency Medicine | Admitting: Emergency Medicine

## 2017-03-19 ENCOUNTER — Encounter (HOSPITAL_COMMUNITY): Payer: Self-pay | Admitting: Nurse Practitioner

## 2017-03-19 DIAGNOSIS — R609 Edema, unspecified: Secondary | ICD-10-CM

## 2017-03-19 DIAGNOSIS — Z79899 Other long term (current) drug therapy: Secondary | ICD-10-CM | POA: Insufficient documentation

## 2017-03-19 DIAGNOSIS — I10 Essential (primary) hypertension: Secondary | ICD-10-CM | POA: Diagnosis not present

## 2017-03-19 DIAGNOSIS — Z794 Long term (current) use of insulin: Secondary | ICD-10-CM | POA: Diagnosis not present

## 2017-03-19 DIAGNOSIS — Z96641 Presence of right artificial hip joint: Secondary | ICD-10-CM | POA: Diagnosis not present

## 2017-03-19 DIAGNOSIS — M79604 Pain in right leg: Secondary | ICD-10-CM | POA: Diagnosis not present

## 2017-03-19 DIAGNOSIS — R600 Localized edema: Secondary | ICD-10-CM | POA: Diagnosis not present

## 2017-03-19 DIAGNOSIS — Z7982 Long term (current) use of aspirin: Secondary | ICD-10-CM | POA: Diagnosis not present

## 2017-03-19 DIAGNOSIS — E1140 Type 2 diabetes mellitus with diabetic neuropathy, unspecified: Secondary | ICD-10-CM | POA: Insufficient documentation

## 2017-03-19 DIAGNOSIS — R58 Hemorrhage, not elsewhere classified: Secondary | ICD-10-CM | POA: Diagnosis not present

## 2017-03-19 MED ORDER — ENOXAPARIN SODIUM 100 MG/ML ~~LOC~~ SOLN
100.0000 mg | Freq: Once | SUBCUTANEOUS | Status: AC
Start: 2017-03-19 — End: 2017-03-19
  Administered 2017-03-19: 100 mg via SUBCUTANEOUS
  Filled 2017-03-19: qty 1

## 2017-03-19 NOTE — ED Triage Notes (Signed)
Pt states she went to urgent care wih c/o tight leg pain. At the urgent care pt states they did a d-dimer and advised her to come in for a right leg [place]. She has leg swelling to the the right leg, CAM Walker boot that she reports ankle future that she is under the care of an orthopedist.

## 2017-03-19 NOTE — ED Provider Notes (Signed)
[facility] DEPT Provider Note   CSN: [id number] Arrival date & time: 03/19/17  2035     History   Chief Complaint Chief Complaint  Patient presents with  . Leg Pain    Right Lateral    HPI [name] [name] is a 70 y.o. female.  HPI  Patient presents with concern of right leg swelling. She is a notable history of recent avulsion fracture of the right leg, has a leg in an immobilization and walker. She has not had surgery, but has had immobilization for about 2 weeks. No the past few days she's noticed increasing swelling in the right leg. With concern for DVT she went to urgent care. She reportedly had a positive D-dimer test and was sent here for evaluation. Patient has a notable history of prior DVT, possibly precipitated by airline flight. She notes that following the event she was on anticoagulants, but is not currently taking anything. She also had test for coagulation status, which are recalls were unremarkable.  Past Medical History:  Diagnosis Date  . Arthritis   . Deep vein thrombosis ([place])    right calf - 05/2012   . Diabetes mellitus without complication (HCC)    diet controlled   . GERD (gastroesophageal reflux disease)   . Hyperlipidemia   . Hypertension    Ejection fraction =>55% Left ventricular systolic function is normal. Left ventricular wall motion is normal    . Lymphocytic colitis   . Neuropathy    diabetic - in bilateral feet   . Pityriasis lichenoides chronica   . Sleep apnea    bipap    Patient Active Problem List   Diagnosis Date Noted  . Chest pain 01/12/2015  . Right bundle branch block 05/20/2014  . OSA (obstructive sleep apnea) 03/12/2013  . Essential hypertension 03/12/2013  . Incomplete RBBB 03/12/2013  . Expected blood loss anemia 09/14/2012  . Obesity 09/14/2012  . Hyponatremia 09/14/2012  . S/P right THA, AA 09/13/2012    Past Surgical History:  Procedure Laterality Date  . DILATION AND CURETTAGE OF UTERUS    . HAMMER  TOE SURGERY    . right hand surgery      due to blood infection   . torn meniscus repair      right knee   . TOTAL HIP ARTHROPLASTY  09/13/2012   Procedure: TOTAL HIP ARTHROPLASTY ANTERIOR APPROACH;  Surgeon: Mauri Pole, MD;  Location: [facility];  Service: Orthopedics;  Laterality: Right;    OB History    No data available       Home Medications    Prior to Admission medications   Medication Sig Start Date End Date Taking? Authorizing Provider  adalimumab (HUMIRA PEN) 40 MG/0.8ML injection Inject 40 mg into the skin every 14 (fourteen) days. Every other week    [provider]  allopurinol (ZYLOPRIM) 100 MG tablet Take 100 mg by mouth 2 (two) times daily.    [provider]  aspirin 81 MG tablet Take 81 mg by mouth daily.    [provider]  Budesonide (UCERIS) 9 MG TB24 Take 1 tablet by mouth daily.    [provider]  Cholecalciferol (VITAMIN D3) 1000 UNITS CAPS Take 1,000 Units by mouth 2 (two) times daily.     [provider]  citalopram (CELEXA) 20 MG tablet Take 20 mg by mouth daily.    [provider]  Cyanocobalamin (B-12 SL) Place 1 tablet under the tongue daily.    [provider]  cyclobenzaprine (FLEXERIL) 10 MG tablet Take 10 mg by mouth 3 (three) times daily as needed for muscle spasms.    [provider]  FOLIC ACID PO Take 1 tablet by mouth daily.     [provider]  lisinopril-hydrochlorothiazide (PRINZIDE,ZESTORETIC) 20-12.5 MG per tablet Take 1 tablet by mouth every morning.    [provider]  loperamide (IMODIUM) 2 MG capsule Take 6 mg by mouth 2 (two) times daily.    [provider]  MELATONIN SL Place 2 tablets under the tongue at bedtime.     [provider]  methotrexate (RHEUMATREX) 2.5 MG tablet Take 20 mg by mouth once a week. Caution:Chemotherapy. Protect from light. 8 tablets, once a week    [provider]  mometasone (ELOCON) 0.1 % cream  Apply 1 application topically daily as needed. For psoriasis.    [provider]  mupirocin cream (BACTROBAN) 2 % Apply 1 application topically 2 (two) times daily as needed. For psoriasis.    [provider]  omeprazole (PRILOSEC) 40 MG capsule Take 40 mg by mouth at bedtime.    [provider]  PRESCRIPTION MEDICATION Place 3 drops into both ears 2 (two) times daily as needed. Fluocinolone 0.01% Ear Drops. For inflammation and itching of the ear.    [provider]  Probiotic Product (ALIGN PO) Take 1 capsule by mouth daily.    [provider]    Family History History reviewed. No pertinent family history.  Social History Social History  Substance Use Topics  . Smoking status: Never Smoker  . Smokeless tobacco: Never Used  . Alcohol use Yes     Comment: occasional wine      Allergies   Other and Sulfa antibiotics   Review of Systems Review of Systems  Constitutional:       Per HPI, otherwise negative  HENT:       Per HPI, otherwise negative  Respiratory:       Per HPI, otherwise negative  Cardiovascular:       Per HPI, otherwise negative  Gastrointestinal: Negative for vomiting.  Endocrine:       Negative aside from HPI  Genitourinary:       Neg aside from HPI   Musculoskeletal:       Per HPI, otherwise negative  Skin: Negative.   Neurological: Negative for syncope.  Hematological:       Per history of present illness     Physical Exam Updated Vital Signs BP (!) 162/80 (BP Location: Left Arm)   Pulse 78   Temp 98 F (36.7 C) (Oral)   SpO2 99%   Physical Exam  Constitutional: She is oriented to person, place, and time. She appears well-developed and well-nourished. No distress.  HENT:  Head: Normocephalic and atraumatic.  Eyes: Conjunctivae and EOM are normal.  Cardiovascular: Normal rate and regular rhythm.   Pulmonary/Chest: Effort normal and breath sounds normal. No stridor. No respiratory distress. She  has no wheezes.  Abdominal: She exhibits no distension.  Musculoskeletal: She exhibits no edema.  Right leg cam walker, grossly enlarged compared to the contralateral side  Neurological: She is alert and oriented to person, place, and time. No cranial nerve deficit.  Skin: Skin is warm and dry.  Psychiatric: She has a normal mood and affect.  Nursing note and vitals reviewed.    ED Treatments / Results   Procedures Procedures (including critical care time)  Medications Ordered in ED Medications  enoxaparin (  LOVENOX) injection 100 mg (not administered)     Initial Impression / Assessment and Plan / ED Course  I have reviewed the triage vital signs and the nursing notes.  Pertinent labs & imaging results that were available during my care of the patient were reviewed by me and considered in my medical decision making (see chart for details).  Patient with history of prior DVT presents with new right lower extremity swelling after recent orthopedic injury. Patient has positive d-dimer at urgent care. Here the patient has no clinical evidence for pulmonary embolism, but there is suspicion for DVT. Ultrasound study is not currently available at this facility. The patient received empiric Lovenox, and was scheduled for a follow-up with ultrasound.  Final Clinical Impressions(s) / ED Diagnoses   Final diagnoses:  Right leg pain      Carmin Muskrat, MD 03/19/17 2333

## 2017-03-19 NOTE — Discharge Instructions (Signed)
As discussed, it is important that you return here tomorrow for ultrasound of your right leg to exclude the possibility of a blood clot.  Return here for any concerning changes if they occur in the interim.

## 2017-03-20 ENCOUNTER — Ambulatory Visit (HOSPITAL_COMMUNITY)
Admission: RE | Admit: 2017-03-20 | Discharge: 2017-03-20 | Disposition: A | Discharge status: Home / Self Care | Payer: Medicare Other | Source: Ambulatory Visit | Attending: Emergency Medicine | Admitting: Emergency Medicine

## 2017-03-20 ENCOUNTER — Other Ambulatory Visit (HOSPITAL_COMMUNITY): Payer: Self-pay | Admitting: Emergency Medicine

## 2017-03-20 DIAGNOSIS — R609 Edema, unspecified: Secondary | ICD-10-CM | POA: Insufficient documentation

## 2017-03-20 NOTE — Progress Notes (Signed)
VASCULAR LAB PRELIMINARY  PRELIMINARY  PRELIMINARY  PRELIMINARY  Right lower extremity venous duplex completed.    Preliminary report:  There is no obvious evidence of DVT or SVT noted in the right lower extremity.  Interstitial fluid noted in the right calf.   Jaylena Holloway, RVT 03/20/2017, 9:21 AM

## 2017-03-24 DIAGNOSIS — M19079 Primary osteoarthritis, unspecified ankle and foot: Secondary | ICD-10-CM | POA: Diagnosis not present

## 2017-03-24 DIAGNOSIS — L02611 Cutaneous abscess of right foot: Secondary | ICD-10-CM | POA: Diagnosis not present

## 2017-03-24 DIAGNOSIS — M14671 Charcot's joint, right ankle and foot: Secondary | ICD-10-CM | POA: Diagnosis not present

## 2017-03-25 DIAGNOSIS — R21 Rash and other nonspecific skin eruption: Secondary | ICD-10-CM | POA: Diagnosis not present

## 2017-03-25 DIAGNOSIS — L411 Pityriasis lichenoides chronica: Secondary | ICD-10-CM | POA: Diagnosis not present

## 2017-04-01 DIAGNOSIS — K5289 Other specified noninfective gastroenteritis and colitis: Secondary | ICD-10-CM | POA: Diagnosis not present

## 2017-04-01 DIAGNOSIS — L02611 Cutaneous abscess of right foot: Secondary | ICD-10-CM | POA: Diagnosis not present

## 2017-04-01 DIAGNOSIS — M19079 Primary osteoarthritis, unspecified ankle and foot: Secondary | ICD-10-CM | POA: Diagnosis not present

## 2017-04-01 DIAGNOSIS — M14671 Charcot's joint, right ankle and foot: Secondary | ICD-10-CM | POA: Diagnosis not present

## 2017-04-02 DIAGNOSIS — D2261 Melanocytic nevi of right upper limb, including shoulder: Secondary | ICD-10-CM | POA: Diagnosis not present

## 2017-04-02 DIAGNOSIS — L404 Guttate psoriasis: Secondary | ICD-10-CM | POA: Diagnosis not present

## 2017-04-02 DIAGNOSIS — R21 Rash and other nonspecific skin eruption: Secondary | ICD-10-CM | POA: Diagnosis not present

## 2017-04-08 ENCOUNTER — Ambulatory Visit (INDEPENDENT_AMBULATORY_CARE_PROVIDER_SITE_OTHER): Payer: Medicare Other | Admitting: Podiatry

## 2017-04-08 ENCOUNTER — Encounter: Payer: Self-pay | Admitting: Podiatry

## 2017-04-08 DIAGNOSIS — M79675 Pain in left toe(s): Secondary | ICD-10-CM | POA: Diagnosis not present

## 2017-04-08 DIAGNOSIS — M79674 Pain in right toe(s): Secondary | ICD-10-CM | POA: Diagnosis not present

## 2017-04-08 DIAGNOSIS — M79676 Pain in unspecified toe(s): Secondary | ICD-10-CM

## 2017-04-08 DIAGNOSIS — B351 Tinea unguium: Secondary | ICD-10-CM | POA: Diagnosis not present

## 2017-04-08 NOTE — Progress Notes (Signed)
Subjective:    Patient ID: [name] [name], female   DOB: 71 y.o.   MRN: [id number]   HPI patient presents with elongated nailbeds 1-5 both feet that are thick yellow brittle and moderately painful when pressed  ROS      Objective:  Physical Exam neurovascular status intact with damage nailbeds 1-5 both feet that are yellow brittle and painful when pressed     Assessment:   Mycotic nail infection with pain 1-5 both feet      Plan:   Debris painful nailbeds 1-5 both feet with no iatrogenic bleeding noted

## 2017-04-21 DIAGNOSIS — L97511 Non-pressure chronic ulcer of other part of right foot limited to breakdown of skin: Secondary | ICD-10-CM | POA: Diagnosis not present

## 2017-04-27 ENCOUNTER — Ambulatory Visit (INDEPENDENT_AMBULATORY_CARE_PROVIDER_SITE_OTHER): Payer: Medicare Other | Admitting: Cardiovascular Disease

## 2017-04-27 ENCOUNTER — Encounter: Payer: Self-pay | Admitting: Cardiovascular Disease

## 2017-04-27 VITALS — BP 124/58 | HR 79 | Ht 66.5 in

## 2017-04-27 DIAGNOSIS — I10 Essential (primary) hypertension: Secondary | ICD-10-CM | POA: Diagnosis not present

## 2017-04-27 DIAGNOSIS — G4733 Obstructive sleep apnea (adult) (pediatric): Secondary | ICD-10-CM

## 2017-04-27 DIAGNOSIS — M25473 Effusion, unspecified ankle: Secondary | ICD-10-CM

## 2017-04-27 DIAGNOSIS — E668 Other obesity: Secondary | ICD-10-CM

## 2017-04-27 NOTE — Patient Instructions (Signed)

## 2017-04-27 NOTE — Progress Notes (Signed)
Patient ID: [name] [name], female   DOB: 1945-09-16, 71 y.o.   MRN: [id number]     HPI: [name] [name] is a 71 y.o. female percents in the office today for an 7 month followup sleep/cardiology evaluation.  [name] [name] is a retired [employer]  who has a history of hypertension, obesity, psoriatic arthritis, DVT, which occurred while traveling to [place], as well as complex obstructive sleep apnea.  She has had difficulty in the past with CPAP therapy. She has been able to tolerate BiPAP and has had a Respironics BiPAP Auto unit well but also has had difficulty with some of her high pressure requirements. In April 2014 I changed her maximum BiPAP pressure to 19 and her maximum EPAP pressure to 15. I also reduced her minimum EPAP pressure to 6 and her minimal IPAP pressure to 10 with pressure support of 4 and changed her Bi_Flex to 3. She has been followed by [facility].  Clinically, she had felt markedly improved with BiPAP.  Compared to prior CPAP therapy.  She has psoriatic arthritis and is on Humira as well as methotrexate followed by rheumatology.  She also has a Charcot joint in her right foot.   She is on Cymbalta for pain relief relative to this. She does not routinely exercise.  She keeps busy moving around, but typically does not do aerobic activity.  She has not been successful with significant weight loss.   She recently, she has been taking lisinopril HCTZ 20/12.5 mg for hypertension.  She had experienced an episode of chest discomfort which she felt was like a cramp and she noticed this most when standing up, but was associated with diaphoresis and shortness of breath.  She does have family history for coronary artery disease both in her mother as well as in her maternal grandmother. She has not been able to be very active due to her Charcot foot.  She also had noticed some mild shortness of breath with activity.  She eventually was referred for a nuclear perfusion study on 01/31/2015 which  revealed normal perfusion and function; ejection fraction 64%.  When I last saw her in February 2017, her BiPAP machine had begun to fail.  She continues to use CPAP with 100% compliance and cannot sleep without it.  She typically sleeps for at least 8 hours per night.  She had taken her BiPAP machine to [place] who stated that her machine essentially had stopped working and is in need for new machine.   She received a new Respironics Dream station auto BiPAP unit in April 2017.  She continues to use a Customer service manager FX full face mask, medium size.  She feels that the machine has been working well but she has had difficulty with her sleep.  At times she feels that she is not getting enough pressure.  Previously she had been set on a BiPAP auto unit with an EPAP minimum of 8 and IPAP max of 19 , which had worked well.  I obtained a new download today from 02/16/2016 through 03/16/2016.  She is 100% compliant and is averaging 8 hours and 25 minutes of sleep.  Apparently, she is not been set on auto and has been on and IPAP of 14 and an EPAP of 10.  Her AHI is increased at 25.1.  When I last saw her, I changed her BiPAP mode from a fixed pressure to auto BiPAP initiating atenolol over 6 with potential titration up to 20/16.  I also allowed for pressure  support to change from 4-6 as needed.  Over the past year, she states that she has felt well from a cardiac perspective, but has had significant infections including MRSA, strep throat, UTI, and she had fractured her right ankle.  As result, she has noticed some ankle swelling right greater than left.  She continues to use her BiPAP and believes she is feeling much better than she had in the past.  [name] is her DME company.  I obtained a download in the office today from 01/26/2017 through 04/25/2017.  She is 100% compliance.  She is averaging 8 hours and 36 minutes of sleep per night.  Her average device IPAP pressure was 18.2 and average device CPAP pressure greater  than 90% of the time was 14.1.  AHI continued to be elevated at 21.2. An Epworth scale score was calculated in the office today and this endorsed at 4, arguing against daytime sleepiness.  She presents for evaluation.  Past Medical History:  Diagnosis Date  . Arthritis   . Deep vein thrombosis ([place])    right calf - 05/2012   . Diabetes mellitus without complication (HCC)    diet controlled   . GERD (gastroesophageal reflux disease)   . Hyperlipidemia   . Hypertension    Ejection fraction =>55% Left ventricular systolic function is normal. Left ventricular wall motion is normal    . Lymphocytic colitis   . Neuropathy    diabetic - in bilateral feet   . Pityriasis lichenoides chronica   . Sleep apnea    bipap    Past Surgical History:  Procedure Laterality Date  . DILATION AND CURETTAGE OF UTERUS    . HAMMER TOE SURGERY    . right hand surgery      due to blood infection   . torn meniscus repair      right knee   . TOTAL HIP ARTHROPLASTY  09/13/2012   Procedure: TOTAL HIP ARTHROPLASTY ANTERIOR APPROACH;  Surgeon: Mauri Pole, MD;  Location: [facility];  Service: Orthopedics;  Laterality: Right;    Allergies  Allergen Reactions  . Other Shortness Of Breath    Blue fish: palms and feet turn red  . Sulfa Antibiotics Hives    Current Outpatient Prescriptions  Medication Sig Dispense Refill  . adalimumab (HUMIRA PEN) 40 MG/0.8ML injection Inject 40 mg into the skin every 14 (fourteen) days. Every other week    . allopurinol (ZYLOPRIM) 100 MG tablet Take 100 mg by mouth 2 (two) times daily.    Marland Kitchen aspirin 81 MG tablet Take 81 mg by mouth daily.    . Budesonide (UCERIS) 9 MG TB24 Take 1 tablet by mouth daily.    . Cholecalciferol (VITAMIN D3) 1000 UNITS CAPS Take 1,000 Units by mouth 2 (two) times daily.     . citalopram (CELEXA) 20 MG tablet Take 20 mg by mouth daily.    . Cyanocobalamin (B-12 SL) Place 1 tablet under the tongue daily.    . cyclobenzaprine (FLEXERIL) 10 MG tablet  Take 10 mg by mouth 3 (three) times daily as needed for muscle spasms.    Marland Kitchen doxycycline (VIBRAMYCIN) 100 MG capsule Take 100 mg by mouth 2 (two) times daily.    Marland Kitchen FOLIC ACID PO Take 1 tablet by mouth daily.     Marland Kitchen lisinopril-hydrochlorothiazide (PRINZIDE,ZESTORETIC) 20-12.5 MG per tablet Take 1 tablet by mouth every morning.    . loperamide (IMODIUM) 2 MG capsule Take 6 mg by mouth 2 (two) times daily.    Marland Kitchen  MELATONIN SL Place 2 tablets under the tongue at bedtime.     . methotrexate (RHEUMATREX) 2.5 MG tablet Take 20 mg by mouth once a week. Caution:Chemotherapy. Protect from light. 8 tablets, once a week    . mometasone (ELOCON) 0.1 % cream Apply 1 application topically daily as needed. For psoriasis.    . mupirocin cream (BACTROBAN) 2 % Apply 1 application topically 2 (two) times daily as needed. For psoriasis.    Marland Kitchen omeprazole (PRILOSEC) 40 MG capsule Take 40 mg by mouth at bedtime.    Marland Kitchen PRESCRIPTION MEDICATION Place 3 drops into both ears 2 (two) times daily as needed. Fluocinolone 0.01% Ear Drops. For inflammation and itching of the ear.    . Probiotic Product (ALIGN PO) Take 1 capsule by mouth daily.     No current facility-administered medications for this visit.     Socially she is widowed. She has 2 children and 2 grandchildren. Her daughter is a Engineer, drilling. There is no tobacco use. She does drink occasional alcohol.  ROS General: Negative; No fevers, chills, or night sweats;  HEENT: Negative; No changes in vision or hearing, sinus congestion, difficulty swallowing Pulmonary: Negative; No cough, wheezing, shortness of breath, hemoptysis Cardiovascular: Negative; No chest pain, presyncope, syncope, palpitations GI: History of lymphocytic colitis GU: Negative; No dysuria, hematuria, or difficulty voiding Musculoskeletal: Positive for R foot charcot joint; recent right ankle fracture Hematologic/Oncology: Negative; no easy bruising, bleeding Rheumatologic: Psoriatic arthritis;  History  of gout Endocrine: Negative; no heat/cold intolerance; no diabetes Neuro: Negative; no changes in balance, headaches Skin: Negative; No rashes or skin lesions Psychiatric: Negative; No behavioral problems, depression Sleep: Positive for complex sleep apnea on BiPAP therapy;  No residual snoring, daytime sleepiness, hypersomnolence, bruxism, restless legs, hypnogognic hallucinations, no cataplexy Other comprehensive 14 point system review is negative.  PE BP (!) 124/58   Pulse 79   Ht 5' 6.5" (1.689 m)    Repeat blood pressure by me 105/60  Wt Readings from Last 3 Encounters:  03/18/16 225 lb 6.4 oz (102.2 kg)  10/02/15 238 lb (108 kg)  01/30/15 237 lb (107.5 kg)   General: Alert, oriented, no distress.  Skin: normal turgor, no rashes, warm and dry HEENT: Normocephalic, atraumatic. Pupils equal round and reactive to light; sclera anicteric; extraocular muscles intact; Fundi ** Nose without nasal septal hypertrophy Mouth/Parynx benign; Mallinpatti scale Neck: No JVD, no carotid bruits; normal carotid upstroke Lungs: clear to ausculatation and percussion; no wheezing or rales Chest wall: without tenderness to palpitation Heart: PMI not displaced, RRR, s1 s2 normal, 1/6 systolic murmur, no diastolic murmur, no rubs, gallops, thrills, or heaves Abdomen: soft, nontender; no hepatosplenomehaly, BS+; abdominal aorta nontender and not dilated by palpation. Back: no CVA tenderness Pulses 2+ Musculoskeletal: full range of motion, normal strength, no joint deformities Extremities: 1+ right ankle edema, trace left ankle edema; no clubbing cyanosis, Homan's sign negative  Neurologic: grossly nonfocal; Cranial nerves grossly wnl Psychologic: Normal mood and affect  ECG (independently read by me): normal sinus rhythm at 79 bpm.  Right bundle-branch block.  QTc interval 481 ms.  June 2016 ECG (independently read by me):  Normal sinus rhythm with right bundle branch block.  October 2015ECG  (independently read by me): Normal sinus rhythm at 71 beats per minute right bundle branch block with repolarization changes.  Normal intervals.  Prior July 2014 ECG sinus rhythm with incomplete right bundle branch block. QRS duration 118 ms. Nonspecific ST-T changes.  LABS: BMP Latest Ref Rng &  Units 09/14/2012 09/06/2012  Glucose 70 - 99 mg/dL 181(H) 108(H)  BUN 6 - 23 mg/dL 16 16  Creatinine 0.50 - 1.10 mg/dL 0.69 0.77  Sodium 135 - 145 mEq/L 131(L) 135  Potassium 3.5 - 5.1 mEq/L 4.5 4.2  Chloride 96 - 112 mEq/L 97 97  CO2 19 - 32 mEq/L 27 28  Calcium 8.4 - 10.5 mg/dL 8.6 9.8   No flowsheet data found. CBC Latest Ref Rng & Units 09/14/2012 09/06/2012  WBC 4.0 - 10.5 K/uL 13.7(H) 7.0  Hemoglobin 12.0 - 15.0 g/dL 10.4(L) 14.0  Hematocrit 36.0 - 46.0 % 29.8(L) 41.5  Platelets 150 - 400 K/uL 181 229   Lab Results  Component Value Date   MCV 92.8 09/14/2012   MCV 94.3 09/06/2012   No results found for: TSH No results found for: HGBA1C  Lipid Panel  No results found for: CHOL, TRIG, HDL, CHOLHDL, VLDL, LDLCALC, LDLDIRECT  RADIOLOGY: No results found.  IMPRESSION:  1. OSA (obstructive sleep apnea)   2. Moderate obesity   3. Essential hypertension   4. Ankle edema     ASSESSMENT AND PLAN: [name] [name] is a 71 year old white female who has a history of hypertension, obesity, psoriatic arthritis, complex sleep apnea, as well as a remote history of DVT.    She sees [name] for rheumatology and Dr. Cristina Gong for her lymphocytic colitis.  Her body mass index is 35 kg/m squared compatible with moderate obesity.  Previously she had experienced an episode of chest pain.  A nuclear perfusion study was entirely normal.  She received a new BiPAP machine, which she believes is much quieter than her old unit. He has felt significant improved with the adjustments that were made last year to her BiPAP.  I reviewed her download with her in detail.  She is sleeping adequate duration.  However, her  AHI continues to be elevated although improved from previously.  I have suggested that she reduce the ramp time from 30 minutes down to 20 minutes.  I'm also increasing her starting pressure from 4 up to 6 cm.  Asked, when we tried starting her at a higher pressure from her current 10/6.  She would have significant difficulty with sleeping and as result, I will keep her initiation 10/6 pressure.  I am titrating her maximum pressure of 222/18.  She continues to use a fullface mask and is unaware of any significant leak.  A new download will be obtained with this adjustment. I will see her in 6 months for reevaluation. Time spent: 25 minutes  Troy Sine, MD, [facility]  04/29/2017 5:57 PM

## 2017-04-29 DIAGNOSIS — E559 Vitamin D deficiency, unspecified: Secondary | ICD-10-CM | POA: Diagnosis not present

## 2017-04-29 DIAGNOSIS — M1460 Charcot's joint, unspecified site: Secondary | ICD-10-CM | POA: Diagnosis not present

## 2017-04-29 DIAGNOSIS — Z1389 Encounter for screening for other disorder: Secondary | ICD-10-CM | POA: Diagnosis not present

## 2017-04-29 DIAGNOSIS — I10 Essential (primary) hypertension: Secondary | ICD-10-CM | POA: Diagnosis not present

## 2017-04-29 DIAGNOSIS — Z0001 Encounter for general adult medical examination with abnormal findings: Secondary | ICD-10-CM | POA: Diagnosis not present

## 2017-04-29 DIAGNOSIS — Z23 Encounter for immunization: Secondary | ICD-10-CM | POA: Diagnosis not present

## 2017-04-29 DIAGNOSIS — E119 Type 2 diabetes mellitus without complications: Secondary | ICD-10-CM | POA: Diagnosis not present

## 2017-05-03 ENCOUNTER — Telehealth: Payer: Self-pay | Admitting: *Deleted

## 2017-05-03 NOTE — Telephone Encounter (Signed)
Bipap order faxed to [facility] (setting change recommended at OV with Dr. Claiborne Billings)

## 2017-05-06 DIAGNOSIS — D72828 Other elevated white blood cell count: Secondary | ICD-10-CM | POA: Diagnosis not present

## 2017-05-06 DIAGNOSIS — L404 Guttate psoriasis: Secondary | ICD-10-CM | POA: Diagnosis not present

## 2017-05-06 DIAGNOSIS — L989 Disorder of the skin and subcutaneous tissue, unspecified: Secondary | ICD-10-CM | POA: Diagnosis not present

## 2017-05-07 DIAGNOSIS — L97512 Non-pressure chronic ulcer of other part of right foot with fat layer exposed: Secondary | ICD-10-CM | POA: Diagnosis not present

## 2017-05-07 DIAGNOSIS — L03115 Cellulitis of right lower limb: Secondary | ICD-10-CM | POA: Diagnosis not present

## 2017-05-07 DIAGNOSIS — R8290 Unspecified abnormal findings in urine: Secondary | ICD-10-CM | POA: Diagnosis not present

## 2017-05-07 DIAGNOSIS — D72828 Other elevated white blood cell count: Secondary | ICD-10-CM | POA: Diagnosis not present

## 2017-05-19 DIAGNOSIS — L97511 Non-pressure chronic ulcer of other part of right foot limited to breakdown of skin: Secondary | ICD-10-CM | POA: Diagnosis not present

## 2017-05-21 DIAGNOSIS — L4059 Other psoriatic arthropathy: Secondary | ICD-10-CM | POA: Diagnosis not present

## 2017-05-21 DIAGNOSIS — Z79899 Other long term (current) drug therapy: Secondary | ICD-10-CM | POA: Diagnosis not present

## 2017-06-08 DIAGNOSIS — L4059 Other psoriatic arthropathy: Secondary | ICD-10-CM | POA: Diagnosis not present

## 2017-06-08 DIAGNOSIS — Z79899 Other long term (current) drug therapy: Secondary | ICD-10-CM | POA: Diagnosis not present

## 2017-06-09 ENCOUNTER — Encounter: Payer: Self-pay | Admitting: Internal Medicine

## 2017-06-09 LAB — HM DEXA SCAN

## 2017-06-22 DIAGNOSIS — L4059 Other psoriatic arthropathy: Secondary | ICD-10-CM | POA: Diagnosis not present

## 2017-07-09 ENCOUNTER — Encounter: Payer: Self-pay | Admitting: Podiatry

## 2017-07-09 ENCOUNTER — Ambulatory Visit (INDEPENDENT_AMBULATORY_CARE_PROVIDER_SITE_OTHER): Payer: Medicare Other | Admitting: Podiatry

## 2017-07-09 DIAGNOSIS — E669 Obesity, unspecified: Secondary | ICD-10-CM | POA: Diagnosis not present

## 2017-07-09 DIAGNOSIS — E1140 Type 2 diabetes mellitus with diabetic neuropathy, unspecified: Secondary | ICD-10-CM

## 2017-07-09 DIAGNOSIS — Q828 Other specified congenital malformations of skin: Secondary | ICD-10-CM | POA: Diagnosis not present

## 2017-07-09 DIAGNOSIS — K5289 Other specified noninfective gastroenteritis and colitis: Secondary | ICD-10-CM | POA: Diagnosis not present

## 2017-07-09 DIAGNOSIS — M79674 Pain in right toe(s): Secondary | ICD-10-CM | POA: Diagnosis not present

## 2017-07-09 DIAGNOSIS — M79675 Pain in left toe(s): Secondary | ICD-10-CM | POA: Diagnosis not present

## 2017-07-09 DIAGNOSIS — L408 Other psoriasis: Secondary | ICD-10-CM | POA: Diagnosis not present

## 2017-07-09 DIAGNOSIS — M2550 Pain in unspecified joint: Secondary | ICD-10-CM | POA: Diagnosis not present

## 2017-07-09 DIAGNOSIS — E1149 Type 2 diabetes mellitus with other diabetic neurological complication: Secondary | ICD-10-CM

## 2017-07-09 DIAGNOSIS — Z6839 Body mass index (BMI) 39.0-39.9, adult: Secondary | ICD-10-CM | POA: Diagnosis not present

## 2017-07-09 DIAGNOSIS — Z79899 Other long term (current) drug therapy: Secondary | ICD-10-CM | POA: Diagnosis not present

## 2017-07-09 DIAGNOSIS — L4059 Other psoriatic arthropathy: Secondary | ICD-10-CM | POA: Diagnosis not present

## 2017-07-09 DIAGNOSIS — M1009 Idiopathic gout, multiple sites: Secondary | ICD-10-CM | POA: Diagnosis not present

## 2017-07-09 DIAGNOSIS — B351 Tinea unguium: Secondary | ICD-10-CM

## 2017-07-09 NOTE — Progress Notes (Signed)
Subjective:   Patient ID: [name] [name], female   DOB: 71 y.o.   MRN: [id number]   HPI Patient presents with chronic nail disease 1-5 both feet and keratotic lesion on the inside of the right ankle that at times has drained and becomes painful.  Patient is a diabetic and is at risk   ROS      Objective:  Physical Exam  Neurovascular status unchanged with thick yellow brittle nailbeds 1-5 both feet and lesion of the right medial arch is keratotic and is painful when pressed making walking difficult     Assessment:  Chronic lesion of the arch right secondary to collapse foot structure with nail disease and pain 1-5 both feet with mycotic component     Plan:  Debridement of painful nailbeds 1-5 both feet and lesion right with no iatrogenic bleeding and reappoint for routine care or earlier if any issues should occur.

## 2017-07-23 DIAGNOSIS — Z1231 Encounter for screening mammogram for malignant neoplasm of breast: Secondary | ICD-10-CM | POA: Diagnosis not present

## 2017-07-27 DIAGNOSIS — L4059 Other psoriatic arthropathy: Secondary | ICD-10-CM | POA: Diagnosis not present

## 2017-08-04 DIAGNOSIS — L97511 Non-pressure chronic ulcer of other part of right foot limited to breakdown of skin: Secondary | ICD-10-CM | POA: Diagnosis not present

## 2017-08-04 DIAGNOSIS — M7989 Other specified soft tissue disorders: Secondary | ICD-10-CM | POA: Diagnosis not present

## 2017-08-11 DIAGNOSIS — M79671 Pain in right foot: Secondary | ICD-10-CM | POA: Diagnosis not present

## 2017-08-11 DIAGNOSIS — L97511 Non-pressure chronic ulcer of other part of right foot limited to breakdown of skin: Secondary | ICD-10-CM | POA: Diagnosis not present

## 2017-08-13 DIAGNOSIS — L97511 Non-pressure chronic ulcer of other part of right foot limited to breakdown of skin: Secondary | ICD-10-CM | POA: Diagnosis not present

## 2017-08-13 DIAGNOSIS — M79671 Pain in right foot: Secondary | ICD-10-CM | POA: Diagnosis not present

## 2017-08-23 DIAGNOSIS — L97511 Non-pressure chronic ulcer of other part of right foot limited to breakdown of skin: Secondary | ICD-10-CM | POA: Diagnosis not present

## 2017-08-23 DIAGNOSIS — M79671 Pain in right foot: Secondary | ICD-10-CM | POA: Diagnosis not present

## 2017-08-24 DIAGNOSIS — L4059 Other psoriatic arthropathy: Secondary | ICD-10-CM | POA: Diagnosis not present

## 2017-09-06 DIAGNOSIS — M79671 Pain in right foot: Secondary | ICD-10-CM | POA: Diagnosis not present

## 2017-09-06 DIAGNOSIS — L97511 Non-pressure chronic ulcer of other part of right foot limited to breakdown of skin: Secondary | ICD-10-CM | POA: Diagnosis not present

## 2017-09-09 DIAGNOSIS — M79671 Pain in right foot: Secondary | ICD-10-CM | POA: Diagnosis not present

## 2017-09-09 DIAGNOSIS — L97511 Non-pressure chronic ulcer of other part of right foot limited to breakdown of skin: Secondary | ICD-10-CM | POA: Diagnosis not present

## 2017-09-15 ENCOUNTER — Ambulatory Visit (INDEPENDENT_AMBULATORY_CARE_PROVIDER_SITE_OTHER): Payer: Medicare Other | Admitting: Podiatry

## 2017-09-15 ENCOUNTER — Ambulatory Visit (INDEPENDENT_AMBULATORY_CARE_PROVIDER_SITE_OTHER): Payer: Medicare Other

## 2017-09-15 ENCOUNTER — Encounter: Payer: Self-pay | Admitting: Podiatry

## 2017-09-15 VITALS — BP 93/59 | HR 102 | Temp 99.7°F | Resp 16

## 2017-09-15 DIAGNOSIS — L03119 Cellulitis of unspecified part of limb: Secondary | ICD-10-CM

## 2017-09-15 DIAGNOSIS — S90821A Blister (nonthermal), right foot, initial encounter: Secondary | ICD-10-CM | POA: Diagnosis not present

## 2017-09-15 DIAGNOSIS — L089 Local infection of the skin and subcutaneous tissue, unspecified: Secondary | ICD-10-CM

## 2017-09-15 DIAGNOSIS — L02619 Cutaneous abscess of unspecified foot: Secondary | ICD-10-CM

## 2017-09-15 LAB — CBC WITH DIFFERENTIAL/PLATELET
Basophils Absolute: 34 cells/uL (ref 0–200)
Basophils Relative: 0.2 %
Eosinophils Absolute: 52 cells/uL (ref 15–500)
Eosinophils Relative: 0.3 %
HCT: 37.5 % (ref 35.0–45.0)
Hemoglobin: 12.8 g/dL (ref 11.7–15.5)
Lymphs Abs: 1514 cells/uL (ref 850–3900)
MCH: 30.7 pg (ref 27.0–33.0)
MCHC: 34.1 g/dL (ref 32.0–36.0)
MCV: 89.9 fL (ref 80.0–100.0)
MPV: 10.4 fL (ref 7.5–12.5)
Monocytes Relative: 5.6 %
Neutro Abs: 14637 cells/uL — ABNORMAL HIGH (ref 1500–7800)
Neutrophils Relative %: 85.1 %
Platelets: 235 10*3/uL (ref 140–400)
RBC: 4.17 10*6/uL (ref 3.80–5.10)
RDW: 14.5 % (ref 11.0–15.0)
Total Lymphocyte: 8.8 %
WBC mixed population: 963 cells/uL — ABNORMAL HIGH (ref 200–950)
WBC: 17.2 10*3/uL — ABNORMAL HIGH (ref 3.8–10.8)

## 2017-09-15 LAB — SEDIMENTATION RATE: Sed Rate: 34 mm/h — ABNORMAL HIGH (ref 0–30)

## 2017-09-15 MED ORDER — CIPROFLOXACIN HCL 500 MG PO TABS: 500.0000 mg | ORAL_TABLET | Freq: Two times a day (BID) | ORAL | 0 refills | Status: DC

## 2017-09-15 MED ORDER — AMOXICILLIN-POT CLAVULANATE 875-125 MG PO TABS: 1.0000 | ORAL_TABLET | Freq: Two times a day (BID) | ORAL | 0 refills | Status: DC

## 2017-09-15 NOTE — Progress Notes (Signed)
Subjective:   Patient ID: [name] [name], female   DOB: 72 y.o.   MRN: [id number]   HPI Patient presents stating that she has had a fever this morning and some noted some redness in her right leg.  Patient states that she had this in the last several months and she took an oral antibiotic and it got better but she was concerned about it and wanted me to check it.  Patient states the keratotic lesion has been about the same and her husband trims it and trimmed it 1 week ago   ROS      Objective:  Physical Exam  Neurovascular status was unchanged with patient found to have some localized redness in the right lower leg with a keratotic lesion sub-right arch secondary to Charcot foot structure with slight redness around it but no extension of redness to the leg at all with no increased warmth.  Upon debridement I did note that there is some slight drainage and it appears to have possibility for a low-grade abscess and I flushed it out as best I could and before flushing it I did culture it.  I explained abscess the patient and I also went over her physical signs with temperature now of 99.7 and she has not checked her sugar recently     Assessment:  Cellulitis that is occurring right which may be related to this plantar callus or may be separate from this.     Plan:  Spent a great deal time with her and her husband discussing this condition and the considerations for going to the hospital emergency room versus trying oral antibiotics.  They want to try oral antibiotics so I went ahead today and I did place the patient on Augmentin and Cipro and I gave strict instructions to check her temperature every hour and if it should go above 102 want her to go straight to the emergency room.  I did encourage the emergency room at this time but they do feel like since they got better last time with oral medicine they want to try that again.  I did send her for blood work White blood count with differential and  also sed rate and I did review x-rays with the patient and I applied sterile dressing to the right foot and will see back 1 week or earlier if issues should occur  X-rays indicate that there has been some changes around the bone structure but no indication currently of ostial lysis

## 2017-09-16 ENCOUNTER — Emergency Department (HOSPITAL_COMMUNITY)
Admission: EM | Admit: 2017-09-16 | Discharge: 2017-09-16 | Disposition: A | Discharge status: Home / Self Care | Payer: Medicare Other | Attending: Emergency Medicine | Admitting: Emergency Medicine

## 2017-09-16 ENCOUNTER — Encounter (HOSPITAL_COMMUNITY): Payer: Self-pay | Admitting: Emergency Medicine

## 2017-09-16 ENCOUNTER — Other Ambulatory Visit: Payer: Self-pay

## 2017-09-16 DIAGNOSIS — Z5321 Procedure and treatment not carried out due to patient leaving prior to being seen by health care provider: Secondary | ICD-10-CM | POA: Insufficient documentation

## 2017-09-16 DIAGNOSIS — M79604 Pain in right leg: Secondary | ICD-10-CM | POA: Insufficient documentation

## 2017-09-16 NOTE — ED Notes (Signed)
Pt's spouse stated to this writer that the pt was leaving and didn't want to wait any longer.

## 2017-09-16 NOTE — ED Triage Notes (Signed)
Pt reported that she was started on 2 antibiotics, Amoxicillin and Cipro yesterday for diagnosis of cellulitis of r/lower leg. Pain and redness have increased since yesterday. Directed to ED by Dr.Regal.

## 2017-09-17 ENCOUNTER — Telehealth: Payer: Self-pay | Admitting: *Deleted

## 2017-09-17 NOTE — Telephone Encounter (Signed)
Left voicemail on 09/17/17 at [phone] (cell #) to check to see how they were doing from when they came to our office on 09/15/17 for an infection.  Dr. Paulla Dolly did put patient on Amoxicillin 875-125 mg on 09/15/17.  Waiting for a response back.

## 2017-09-18 LAB — WOUND CULTURE
MICRO NUMBER:: 90160584
SPECIMEN QUALITY:: ADEQUATE

## 2017-09-23 ENCOUNTER — Encounter: Payer: Self-pay | Admitting: Podiatry

## 2017-09-23 ENCOUNTER — Ambulatory Visit (INDEPENDENT_AMBULATORY_CARE_PROVIDER_SITE_OTHER): Payer: Medicare Other | Admitting: Podiatry

## 2017-09-23 DIAGNOSIS — L02619 Cutaneous abscess of unspecified foot: Secondary | ICD-10-CM | POA: Diagnosis not present

## 2017-09-23 DIAGNOSIS — E1149 Type 2 diabetes mellitus with other diabetic neurological complication: Secondary | ICD-10-CM

## 2017-09-23 DIAGNOSIS — L03119 Cellulitis of unspecified part of limb: Secondary | ICD-10-CM

## 2017-09-23 DIAGNOSIS — E1140 Type 2 diabetes mellitus with diabetic neuropathy, unspecified: Secondary | ICD-10-CM

## 2017-09-23 NOTE — Progress Notes (Signed)
Subjective:   Patient ID: [name] [name], female   DOB: 72 y.o.   MRN: [id number]   HPI Patient presents stating doing a lot better with the right foot with significant diminishment of redness warmth and the area appears to be healing   ROS      Objective:  Physical Exam  Patient had significant cellulitis 1 week ago and is responded very well with antibiotics soaks and reduced activity     Assessment:  Mild breakdown around the navicular plantar medial foot that is crusted with local irritation and no drainage noted with no proximal edema erythema noted currently     Plan:  Has responded well to antibiotic that she will stay on for 3 more days.  I did discuss the importance of taking it easy and keeping the foot protected and if this should occur again we will have him start thinking about the possibility of bone infection.  Reappoint for [place] to recheck again as needed

## 2017-09-24 DIAGNOSIS — K5289 Other specified noninfective gastroenteritis and colitis: Secondary | ICD-10-CM | POA: Diagnosis not present

## 2017-09-29 DIAGNOSIS — L821 Other seborrheic keratosis: Secondary | ICD-10-CM | POA: Diagnosis not present

## 2017-09-29 DIAGNOSIS — D692 Other nonthrombocytopenic purpura: Secondary | ICD-10-CM | POA: Diagnosis not present

## 2017-09-29 DIAGNOSIS — E119 Type 2 diabetes mellitus without complications: Secondary | ICD-10-CM | POA: Diagnosis not present

## 2017-09-29 DIAGNOSIS — F419 Anxiety disorder, unspecified: Secondary | ICD-10-CM | POA: Diagnosis not present

## 2017-09-29 DIAGNOSIS — B373 Candidiasis of vulva and vagina: Secondary | ICD-10-CM | POA: Diagnosis not present

## 2017-09-29 DIAGNOSIS — M71371 Other bursal cyst, right ankle and foot: Secondary | ICD-10-CM | POA: Diagnosis not present

## 2017-09-29 DIAGNOSIS — L03115 Cellulitis of right lower limb: Secondary | ICD-10-CM | POA: Diagnosis not present

## 2017-09-29 DIAGNOSIS — G4709 Other insomnia: Secondary | ICD-10-CM | POA: Diagnosis not present

## 2017-09-29 DIAGNOSIS — L400 Psoriasis vulgaris: Secondary | ICD-10-CM | POA: Diagnosis not present

## 2017-10-01 DIAGNOSIS — L4059 Other psoriatic arthropathy: Secondary | ICD-10-CM | POA: Diagnosis not present

## 2017-10-04 DIAGNOSIS — L97511 Non-pressure chronic ulcer of other part of right foot limited to breakdown of skin: Secondary | ICD-10-CM | POA: Diagnosis not present

## 2017-10-04 DIAGNOSIS — M79671 Pain in right foot: Secondary | ICD-10-CM | POA: Diagnosis not present

## 2017-10-04 DIAGNOSIS — M25371 Other instability, right ankle: Secondary | ICD-10-CM | POA: Diagnosis not present

## 2017-10-04 DIAGNOSIS — L97519 Non-pressure chronic ulcer of other part of right foot with unspecified severity: Secondary | ICD-10-CM | POA: Diagnosis not present

## 2017-10-07 DIAGNOSIS — L4059 Other psoriatic arthropathy: Secondary | ICD-10-CM | POA: Diagnosis not present

## 2017-10-07 DIAGNOSIS — L97511 Non-pressure chronic ulcer of other part of right foot limited to breakdown of skin: Secondary | ICD-10-CM | POA: Diagnosis not present

## 2017-10-07 DIAGNOSIS — M79671 Pain in right foot: Secondary | ICD-10-CM | POA: Diagnosis not present

## 2017-10-07 DIAGNOSIS — K5289 Other specified noninfective gastroenteritis and colitis: Secondary | ICD-10-CM | POA: Diagnosis not present

## 2017-10-07 DIAGNOSIS — Z6841 Body Mass Index (BMI) 40.0 and over, adult: Secondary | ICD-10-CM | POA: Diagnosis not present

## 2017-10-07 DIAGNOSIS — Z79899 Other long term (current) drug therapy: Secondary | ICD-10-CM | POA: Diagnosis not present

## 2017-10-07 DIAGNOSIS — M2550 Pain in unspecified joint: Secondary | ICD-10-CM | POA: Diagnosis not present

## 2017-10-07 DIAGNOSIS — M1009 Idiopathic gout, multiple sites: Secondary | ICD-10-CM | POA: Diagnosis not present

## 2017-10-07 DIAGNOSIS — L408 Other psoriasis: Secondary | ICD-10-CM | POA: Diagnosis not present

## 2017-10-11 ENCOUNTER — Ambulatory Visit (INDEPENDENT_AMBULATORY_CARE_PROVIDER_SITE_OTHER): Payer: Medicare Other | Admitting: Podiatry

## 2017-10-11 DIAGNOSIS — M1711 Unilateral primary osteoarthritis, right knee: Secondary | ICD-10-CM | POA: Diagnosis not present

## 2017-10-11 DIAGNOSIS — B351 Tinea unguium: Secondary | ICD-10-CM | POA: Diagnosis not present

## 2017-10-11 DIAGNOSIS — L989 Disorder of the skin and subcutaneous tissue, unspecified: Secondary | ICD-10-CM

## 2017-10-11 DIAGNOSIS — M79676 Pain in unspecified toe(s): Secondary | ICD-10-CM | POA: Diagnosis not present

## 2017-10-12 NOTE — Progress Notes (Signed)
    Subjective: Patient is a 72 y.o. female presenting to the office today with a chief complaint of painful callus lesions to the bilateral feet that have been present for several several months.  She also complains of elongated, thickened nails that cause pain while ambulating in shoes. She is unable to trim her own nails. Patient presents today for further treatment and evaluation.  Past Medical History:  Diagnosis Date  . Arthritis   . Deep vein thrombosis ([place])    right calf - 05/2012   . Diabetes mellitus without complication (HCC)    diet controlled   . GERD (gastroesophageal reflux disease)   . Hyperlipidemia   . Hypertension    Ejection fraction =>55% Left ventricular systolic function is normal. Left ventricular wall motion is normal    . Lymphocytic colitis   . Neuropathy    diabetic - in bilateral feet   . Pityriasis lichenoides chronica   . Sleep apnea    bipap    Objective:  Physical Exam General: Alert and oriented x3 in no acute distress  Dermatology: Hyperkeratotic lesions present on the bilateral feet x 3. Pain on palpation with a central nucleated core noted. Skin is warm, dry and supple bilateral lower extremities. Negative for open lesions or macerations. Nails are tender, long, thickened and dystrophic with subungual debris, consistent with onychomycosis, 1-5 bilateral. No signs of infection noted.  Vascular: Palpable pedal pulses bilaterally. No edema or erythema noted. Capillary refill within normal limits.  Neurological: Epicritic and protective threshold grossly intact bilaterally.   Musculoskeletal Exam: Pain on palpation at the keratotic lesion noted. Range of motion within normal limits bilateral. Muscle strength 5/5 in all groups bilateral.  Assessment: 1. Onychodystrophic nails 1-5 bilateral with hyperkeratosis of nails.  2. Onychomycosis of nail due to dermatophyte bilateral 3. Pre-ulcerative callus lesions noted to the bilateral feet x  3   Plan of Care:  #1 Patient evaluated. #2 Excisional debridement of keratoic lesion using a chisel blade was performed without incident.  #3 Dressed with light dressing. #4 Mechanical debridement of nails 1-5 bilaterally performed using a nail nipper. Filed with dremel without incident.  #5 Recommended good shoe gear.  #6 Patient is to return to the clinic in 3 months.   Edrick Kins, DPM [facility]  Dr. Edrick Kins, [address]                                        [place], [place] [address]                Office [phone]  Fax [phone]

## 2017-10-13 DIAGNOSIS — B373 Candidiasis of vulva and vagina: Secondary | ICD-10-CM | POA: Diagnosis not present

## 2017-10-13 DIAGNOSIS — E119 Type 2 diabetes mellitus without complications: Secondary | ICD-10-CM | POA: Diagnosis not present

## 2017-10-15 DIAGNOSIS — L97511 Non-pressure chronic ulcer of other part of right foot limited to breakdown of skin: Secondary | ICD-10-CM | POA: Diagnosis not present

## 2017-10-15 DIAGNOSIS — M79671 Pain in right foot: Secondary | ICD-10-CM | POA: Diagnosis not present

## 2017-10-18 DIAGNOSIS — M1710 Unilateral primary osteoarthritis, unspecified knee: Secondary | ICD-10-CM | POA: Insufficient documentation

## 2017-10-18 DIAGNOSIS — M179 Osteoarthritis of knee, unspecified: Secondary | ICD-10-CM | POA: Insufficient documentation

## 2017-10-18 DIAGNOSIS — M1711 Unilateral primary osteoarthritis, right knee: Secondary | ICD-10-CM | POA: Diagnosis not present

## 2017-10-25 DIAGNOSIS — M1711 Unilateral primary osteoarthritis, right knee: Secondary | ICD-10-CM | POA: Diagnosis not present

## 2017-10-28 DIAGNOSIS — M79671 Pain in right foot: Secondary | ICD-10-CM | POA: Diagnosis not present

## 2017-10-28 DIAGNOSIS — L97511 Non-pressure chronic ulcer of other part of right foot limited to breakdown of skin: Secondary | ICD-10-CM | POA: Diagnosis not present

## 2017-10-29 DIAGNOSIS — L4059 Other psoriatic arthropathy: Secondary | ICD-10-CM | POA: Diagnosis not present

## 2017-11-09 ENCOUNTER — Encounter: Payer: Self-pay | Admitting: Cardiovascular Disease

## 2017-11-10 ENCOUNTER — Encounter: Payer: Self-pay | Admitting: Cardiovascular Disease

## 2017-11-10 ENCOUNTER — Ambulatory Visit (INDEPENDENT_AMBULATORY_CARE_PROVIDER_SITE_OTHER): Payer: Medicare Other | Admitting: Cardiovascular Disease

## 2017-11-10 VITALS — BP 108/72 | HR 83 | Ht 66.0 in | Wt 253.4 lb

## 2017-11-10 DIAGNOSIS — M25473 Effusion, unspecified ankle: Secondary | ICD-10-CM | POA: Diagnosis not present

## 2017-11-10 DIAGNOSIS — I10 Essential (primary) hypertension: Secondary | ICD-10-CM | POA: Diagnosis not present

## 2017-11-10 DIAGNOSIS — G4733 Obstructive sleep apnea (adult) (pediatric): Secondary | ICD-10-CM | POA: Diagnosis not present

## 2017-11-10 MED ORDER — HYDROCHLOROTHIAZIDE 12.5 MG PO CAPS: 12.5000 mg | ORAL_CAPSULE | ORAL | 3 refills | Status: DC | PRN

## 2017-11-10 NOTE — Patient Instructions (Addendum)
Medications:  START HCTZ 12.5 mg as needed for swelling (this is in addition to Lisinopril/HCTZ)  Follow-Up: Your physician wants you to follow-up in: 6 months with Dr. Claiborne Billings. You will receive a reminder letter in the mail two months in advance. If you don't receive a letter, please call our office to schedule the follow-up appointment.   Any Other Special Instructions Will Be Listed Below (If Applicable).     If you need a refill on your cardiac medications before your next appointment, please call your pharmacy.

## 2017-11-11 NOTE — Progress Notes (Signed)
Patient ID: [name] [name], female   DOB: [birth year], 72 y.o.   MRN: [id number]     HPI: [name] [name] is a 72 y.o. female percents in the office today for a 7 month followup sleep/cardiology evaluation.  [name] [name] is a retired [employer]  who has a history of hypertension, obesity, psoriatic arthritis, DVT, which occurred while traveling to [place], as well as complex obstructive sleep apnea.  She has had difficulty in the past with CPAP therapy. She has been able to tolerate BiPAP and has had a Respironics BiPAP Auto unit well but also has had difficulty with some of her high pressure requirements. In April 2014 I changed her maximum BiPAP pressure to 19 and her maximum EPAP pressure to 15. I also reduced her minimum EPAP pressure to 6 and her minimal IPAP pressure to 10 with pressure support of 4 and changed her Bi_Flex to 3. She has been followed by [name] [name].  Clinically, she had felt markedly improved with BiPAP.  Compared to prior CPAP therapy.  She has psoriatic arthritis and is on Humira as well as methotrexate followed by rheumatology.  She also has a Charcot joint in her right foot.   She is on Cymbalta for pain relief relative to this. She does not routinely exercise.  She keeps busy moving around, but typically does not do aerobic activity.  She has not been successful with significant weight loss.   She recently, she has been taking lisinopril HCTZ 20/12.5 mg for hypertension.  She had experienced an episode of chest discomfort which she felt was like a cramp and she noticed this most when standing up, but was associated with diaphoresis and shortness of breath.  She does have family history for coronary artery disease both in her mother as well as in her maternal grandmother. She has not been able to be very active due to her Charcot foot.  She also had noticed some mild shortness of breath with activity.  She eventually was referred for a nuclear perfusion study on 01/31/2015 which  revealed normal perfusion and function; ejection fraction 64%.  When I last saw her in February 2017, her BiPAP machine had begun to fail.  She continues to use CPAP with 100% compliance and cannot sleep without it.  She typically sleeps for at least 8 hours per night.  She had taken her BiPAP machine to [name] [name] who stated that her machine essentially had stopped working and is in need for new machine.   She received a new Respironics Dream station auto BiPAP unit in April 2017.  She uses a Customer service manager FX full face mask, medium size.  She feels that the machine has been working well but she has had difficulty with her sleep.  At times she feels that she is not getting enough pressure.  Previously she had been set on a BiPAP auto unit with an EPAP minimum of 8 and IPAP max of 19 , which had worked well.  I obtained a new download today from 02/16/2016 through 03/16/2016.  She is 100% compliant and is averaging 8 hours and 25 minutes of sleep.  Apparently, she is not been set on auto and has been on and IPAP of 14 and an EPAP of 10.  Her AHI is increased at 25.1. I changed her BiPAP mode from a fixed pressure to auto BiPAP initiating atenolol over 6 with potential titration up to 20/16.  I also allowed for pressure support to change from 4-6 as needed.  IN 2018, she felt well from a cardiac perspective, but has had significant infections including MRSA, strep throat, UTI, and she had fractured her right ankle.  As result, she has noticed some ankle swelling right greater than left.  She continues to use her BiPAP and believes she is feeling much better than she had in the past.  [name] is her DME company.  A download in the office from 01/26/2017 through 04/25/2017.  She is 100% compliance.  She is averaging 8 hours and 36 minutes of sleep per night.  Her average device IPAP pressure was 18.2 and average device CPAP pressure greater than 90% of the time was 14.1.  AHI continued to be elevated at 21.2. An Epworth  scale score was calculated in the office today and this endorsed at 4, arguing against daytime sleepiness.  I last saw her, she was sleeping adequate duration.  Her AHI remains elevated I suggested that she reduce ramp time from 30 minutes down to 20 minutes and increased her starting pressure from 4 up to 6 cm.  Recently, she has again started to notice some symptoms where she is waking up at night.  She notes leaks from her mask at higher pressures.  She feels that she is more child tired.  She admits to some swelling right lower extremity greater than left and she has a Charcot right foot.  Broke her right ankle in August 2018.  She denies any chest pain.  She is unaware of any cardiac arrhythmias.  Her daughter, Dr. Thornton [name] is a GI physician who has been practicing in [facility] and will be moving up to work with [place].  She is grateful that she will now have her twin granddaughters close by.  She was recently placed on Cimzia for her psoriatic arthritis to take in addition to her methotrexate.  She is followed by Dr. Gavin [name] for her rheumatologic issues.  She presents for follow-up evaluation.  Past Medical History:  Diagnosis Date  . Arthritis   . Deep vein thrombosis ([place])    right calf - 05/2012   . Diabetes mellitus without complication (HCC)    diet controlled   . GERD (gastroesophageal reflux disease)   . Hyperlipidemia   . Hypertension    Ejection fraction =>55% Left ventricular systolic function is normal. Left ventricular wall motion is normal    . Lymphocytic colitis   . Neuropathy    diabetic - in bilateral feet   . Pityriasis lichenoides chronica   . Sleep apnea    bipap    Past Surgical History:  Procedure Laterality Date  . DILATION AND CURETTAGE OF UTERUS    . HAMMER TOE SURGERY    . right hand surgery      due to blood infection   . torn meniscus repair      right knee   . TOTAL HIP ARTHROPLASTY  09/13/2012   Procedure: TOTAL HIP  ARTHROPLASTY ANTERIOR APPROACH;  Surgeon: Mauri Pole, MD;  Location: [facility];  Service: Orthopedics;  Laterality: Right;    Allergies  Allergen Reactions  . Other Shortness Of Breath    Blue fish: palms and feet turn red  . Sulfa Antibiotics Hives    Current Outpatient Medications  Medication Sig Dispense Refill  . allopurinol (ZYLOPRIM) 100 MG tablet Take 100 mg by mouth 2 (two) times daily.    Marland Kitchen aspirin 81 MG tablet Take 81 mg by mouth daily.    . Budesonide (  UCERIS) 9 MG TB24 Take 1 tablet by mouth daily.    . Certolizumab Pegol (CIMZIA Bland) Inject into the skin.    . Cholecalciferol (VITAMIN D3) 1000 UNITS CAPS Take 1,000 Units by mouth 2 (two) times daily.     . citalopram (CELEXA) 20 MG tablet Take 20 mg by mouth daily.    . Cyanocobalamin (B-12 SL) Place 1 tablet under the tongue daily.    . cyclobenzaprine (FLEXERIL) 10 MG tablet Take 10 mg by mouth daily.     Marland Kitchen FOLIC ACID PO Take 1 tablet by mouth daily.     Marland Kitchen lisinopril-hydrochlorothiazide (PRINZIDE,ZESTORETIC) 20-12.5 MG per tablet Take 1 tablet by mouth every morning.    . loperamide (IMODIUM) 2 MG capsule Take 6 mg by mouth 2 (two) times daily.    Marland Kitchen MELATONIN SL Place 2 tablets under the tongue at bedtime.     . methotrexate (RHEUMATREX) 2.5 MG tablet Take 20 mg by mouth once a week. Caution:Chemotherapy. Protect from light. 8 tablets, once a week    . mupirocin cream (BACTROBAN) 2 % Apply 1 application topically 2 (two) times daily as needed. For psoriasis.    Marland Kitchen omeprazole (PRILOSEC) 40 MG capsule Take 40 mg by mouth at bedtime.    Marland Kitchen PRESCRIPTION MEDICATION Place 3 drops into both ears as needed. Fluocinolone 0.01% Ear Drops. For inflammation and itching of the ear.    . Probiotic Product (ALIGN PO) Take 1 capsule by mouth daily.    . hydrochlorothiazide (MICROZIDE) 12.5 MG capsule Take 1 capsule (12.5 mg total) by mouth as needed (swelling). 30 capsule 3  . UNABLE TO FIND Pt takes Senzia (sp??)     No current  facility-administered medications for this visit.     Socially she is widowed. She has 2 children and 2 grandchildren. Her daughter is a Engineer, drilling. There is no tobacco use. She does drink occasional alcohol.  ROS General: Negative; No fevers, chills, or night sweats;  HEENT: Negative; No changes in vision or hearing, sinus congestion, difficulty swallowing Pulmonary: Negative; No cough, wheezing, shortness of breath, hemoptysis Cardiovascular: Negative; No chest pain, presyncope, syncope, palpitations GI: History of lymphocytic colitis GU: Negative; No dysuria, hematuria, or difficulty voiding Musculoskeletal: Positive for R foot charcot joint; recent right ankle fracture Hematologic/Oncology: Negative; no easy bruising, bleeding Rheumatologic: Psoriatic arthritis;  History of gout Endocrine: Negative; no heat/cold intolerance; no diabetes Neuro: Negative; no changes in balance, headaches Skin: History of psoriatic arthritis Psychiatric: Negative; No behavioral problems, depression Sleep: Positive for complex sleep apnea on BiPAP therapy;  No residual snoring, daytime sleepiness, hypersomnolence, bruxism, restless legs, hypnogognic hallucinations, no cataplexy Other comprehensive 14 point system review is negative.  PE BP 108/72   Pulse 83   Ht 5\' 6"  (1.676 m)   Wt 253 lb 6.4 oz (114.9 kg)   BMI 40.90 kg/m    Repeat blood pressure by me 108/70 supine and 106/68 standing  Wt Readings from Last 3 Encounters:  11/10/17 253 lb 6.4 oz (114.9 kg)  03/18/16 225 lb 6.4 oz (102.2 kg)  10/02/15 238 lb (108 kg)   General: Alert, oriented, no distress.  Skin: normal turgor, no rashes, warm and dry HEENT: Normocephalic, atraumatic. Pupils equal round and reactive to light; sclera anicteric; extraocular muscles intact;  Nose without nasal septal hypertrophy Mouth/Parynx benign; Mallinpatti scale 3 Neck: No JVD, no carotid bruits; normal carotid upstroke Lungs: clear to ausculatation and  percussion; no wheezing or rales Chest wall: without tenderness to palpitation Heart:  PMI not displaced, RRR, s1 s2 normal, 1/6 systolic murmur, no diastolic murmur, no rubs, gallops, thrills, or heaves Abdomen: soft, nontender; no hepatosplenomehaly, BS+; abdominal aorta nontender and not dilated by palpation. Back: no CVA tenderness Pulses 2+ Musculoskeletal: full range of motion, normal strength, no joint deformities Extremities: Mild right ankle greater than left ankle swelling; no clubbing cyanosis, Homan's sign negative  Neurologic: grossly nonfocal; Cranial nerves grossly wnl Psychologic: Normal mood and affect   ECG (independently read by me): Normal sinus rhythm at 83 bpm.  Right bundle branch block with repolarization changes.  QTc interval 4 6 0 ms.  September 2018 ECG (independently read by me): normal sinus rhythm at 79 bpm.  Right bundle-branch block.  QTc interval 481 ms.  June 2016 ECG (independently read by me):  Normal sinus rhythm with right bundle branch block.  October 2015ECG (independently read by me): Normal sinus rhythm at 71 beats per minute right bundle branch block with repolarization changes.  Normal intervals.  Prior July 2014 ECG sinus rhythm with incomplete right bundle branch block. QRS duration 118 ms. Nonspecific ST-T changes.  LABS: BMP Latest Ref Rng & Units 09/14/2012 09/06/2012  Glucose 70 - 99 mg/dL 181(H) 108(H)  BUN 6 - 23 mg/dL 16 16  Creatinine 0.50 - 1.10 mg/dL 0.69 0.77  Sodium 135 - 145 mEq/L 131(L) 135  Potassium 3.5 - 5.1 mEq/L 4.5 4.2  Chloride 96 - 112 mEq/L 97 97  CO2 19 - 32 mEq/L 27 28  Calcium 8.4 - 10.5 mg/dL 8.6 9.8   No flowsheet data found. CBC Latest Ref Rng & Units 09/15/2017 09/14/2012 09/06/2012  WBC 3.8 - 10.8 Thousand/uL 17.2(H) 13.7(H) 7.0  Hemoglobin 11.7 - 15.5 g/dL 12.8 10.4(L) 14.0  Hematocrit 35.0 - 45.0 % 37.5 29.8(L) 41.5  Platelets 140 - 400 Thousand/uL 235 181 229   Lab Results  Component Value Date   MCV  89.9 09/15/2017   MCV 92.8 09/14/2012   MCV 94.3 09/06/2012   No results found for: TSH No results found for: HGBA1C  Lipid Panel  No results found for: CHOL, TRIG, HDL, CHOLHDL, VLDL, LDLCALC, LDLDIRECT    RADIOLOGY: No results found.  IMPRESSION:  1. OSA (obstructive sleep apnea)   2. Essential hypertension   3. Ankle edema   4. Morbid obesity ([place])     ASSESSMENT AND PLAN: [name] [name] is a 72 year old white female who has a history of hypertension, obesity, psoriatic arthritis, complex sleep apnea, as well as a remote history of DVT.    She sees Dr. Gavin [name] for rheumatology and Dr. Cristina Gong for her lymphocytic colitis.  She had broken her ankle over the past year and is not been able to walk her be is active.  Her weight is increased today at 253 giving a body mass index of 40.9 consistent with now with morbid obesity.  Recently she has had some issues with waking up at night despite using her BiPAP therapy.  She has a Respironics DreamStation BiPAP Auto unit.  As result she is not linked is only for [place] to obtain downloads.  After she left the office I was able to obtain a download from [employer], her DME company.  Usage is 100%.  Average sleep time is 8 hours and 29 minutes.  Average device IPAP pressure greater than 90% of the time was 16.7 with an average device EPAP pressure of 12.4.  Average AHI is 14.6.  Her minimum EPAP pressure set up is 6 cm.  We  will try to increase this to 8 cm pressure.  Ramp time does not appear to have been adjusted and is still at 30 minutes and I will reduce this to m 20 minutes..  Is recently noticed apparent leak at higher pressures with her mask.  We will try to see if she can tolerate the new dream where style full facemask Respironics or ResMed that she would not have a mask on the bridge of her nose.  Her blood pressure today is stable on a regimen consisting of lisinopril HCT 20/12.5.  She has GERD treated with omeprazole.  She was recently changed from  Humira to Cimzia to take in addition to her methotrexate for her psoriatic arthritis.  Discussed exercise and weight loss.  Hopefully now her mobility is improved following right ankle fracture.  I will see her in 6 months for reevaluation.  Time spent: 30 minutes   Troy Sine, MD, [facility]  11/12/2017 7:47 AM

## 2017-11-12 ENCOUNTER — Encounter: Payer: Self-pay | Admitting: Cardiovascular Disease

## 2017-11-12 DIAGNOSIS — H5213 Myopia, bilateral: Secondary | ICD-10-CM | POA: Diagnosis not present

## 2017-11-12 DIAGNOSIS — H43813 Vitreous degeneration, bilateral: Secondary | ICD-10-CM | POA: Diagnosis not present

## 2017-11-12 DIAGNOSIS — D3131 Benign neoplasm of right choroid: Secondary | ICD-10-CM | POA: Diagnosis not present

## 2017-11-12 DIAGNOSIS — E119 Type 2 diabetes mellitus without complications: Secondary | ICD-10-CM | POA: Diagnosis not present

## 2017-11-12 LAB — HM DIABETES EYE EXAM

## 2017-11-15 NOTE — Addendum Note (Signed)
Addended by: Jacqulynn Cadet on: 11/15/2017 11:22 AM   Modules accepted: Orders

## 2017-11-29 ENCOUNTER — Telehealth: Payer: Self-pay | Admitting: Cardiovascular Disease

## 2017-11-29 NOTE — Telephone Encounter (Signed)
New message  Pt verbalzied that she is calling for RN  She was told that she will receive a new mask  She is have having issues sleeping  Also she needs to know about the C-PAP machine being set up wirelessly   To record her sleeping patterns

## 2017-11-30 ENCOUNTER — Telehealth: Payer: Self-pay | Admitting: *Deleted

## 2017-11-30 NOTE — Telephone Encounter (Signed)
New Message  Pt calling to speak with nurse

## 2017-11-30 NOTE — Telephone Encounter (Signed)
Returned a call to patient.

## 2017-11-30 NOTE — Telephone Encounter (Signed)
Patient notified that I faxed mask order over to [facility] on 11/11/17. Also there  Was a pressure change order sent over to them on 11/12/17.  I called [employer] and spoke with Tiffany and she was unable to tell me if either order has been completed. She will have Jonni Sanger, RT to give me a call tomorrow.

## 2017-12-03 DIAGNOSIS — L4059 Other psoriatic arthropathy: Secondary | ICD-10-CM | POA: Diagnosis not present

## 2017-12-07 DIAGNOSIS — F5104 Psychophysiologic insomnia: Secondary | ICD-10-CM | POA: Diagnosis not present

## 2017-12-07 DIAGNOSIS — F419 Anxiety disorder, unspecified: Secondary | ICD-10-CM | POA: Diagnosis not present

## 2017-12-14 ENCOUNTER — Telehealth: Payer: Self-pay | Admitting: Cardiovascular Disease

## 2017-12-30 DIAGNOSIS — M1711 Unilateral primary osteoarthritis, right knee: Secondary | ICD-10-CM | POA: Diagnosis not present

## 2017-12-30 DIAGNOSIS — Z96641 Presence of right artificial hip joint: Secondary | ICD-10-CM | POA: Diagnosis not present

## 2017-12-30 DIAGNOSIS — M19071 Primary osteoarthritis, right ankle and foot: Secondary | ICD-10-CM | POA: Diagnosis not present

## 2017-12-30 DIAGNOSIS — Z471 Aftercare following joint replacement surgery: Secondary | ICD-10-CM | POA: Diagnosis not present

## 2017-12-30 DIAGNOSIS — M1611 Unilateral primary osteoarthritis, right hip: Secondary | ICD-10-CM | POA: Diagnosis not present

## 2017-12-31 DIAGNOSIS — M25371 Other instability, right ankle: Secondary | ICD-10-CM | POA: Diagnosis not present

## 2017-12-31 DIAGNOSIS — L97519 Non-pressure chronic ulcer of other part of right foot with unspecified severity: Secondary | ICD-10-CM | POA: Diagnosis not present

## 2018-01-04 DIAGNOSIS — Z79899 Other long term (current) drug therapy: Secondary | ICD-10-CM | POA: Diagnosis not present

## 2018-01-04 DIAGNOSIS — L4059 Other psoriatic arthropathy: Secondary | ICD-10-CM | POA: Diagnosis not present

## 2018-01-13 ENCOUNTER — Encounter: Payer: Self-pay | Admitting: Podiatry

## 2018-01-13 ENCOUNTER — Ambulatory Visit (INDEPENDENT_AMBULATORY_CARE_PROVIDER_SITE_OTHER): Payer: Medicare Other | Admitting: Podiatry

## 2018-01-13 DIAGNOSIS — B351 Tinea unguium: Secondary | ICD-10-CM

## 2018-01-13 DIAGNOSIS — M79676 Pain in unspecified toe(s): Secondary | ICD-10-CM

## 2018-01-16 NOTE — Progress Notes (Signed)
Subjective:   Patient ID: [name] [name], female   DOB: 72 y.o.   MRN: [id number]   HPI Patient presents with nail disease 1-5 both feet that are thick and incurvated and she cannot cut   ROS      Objective:  Physical Exam  Neurovascular status intact with thick yellow brittle nailbeds 1-5 both feet that are painful     Assessment:  Chronic mycotic nail infection 1-5 both feet with pain     Plan:  Debride painful nailbeds 1-5 both feet with no iatrogenic bleeding noted

## 2018-01-19 ENCOUNTER — Encounter (HOSPITAL_BASED_OUTPATIENT_CLINIC_OR_DEPARTMENT_OTHER): Payer: Self-pay | Admitting: *Deleted

## 2018-01-19 ENCOUNTER — Emergency Department (HOSPITAL_BASED_OUTPATIENT_CLINIC_OR_DEPARTMENT_OTHER): Payer: Medicare Other

## 2018-01-19 ENCOUNTER — Emergency Department (HOSPITAL_BASED_OUTPATIENT_CLINIC_OR_DEPARTMENT_OTHER)
Admission: EM | Admit: 2018-01-19 | Discharge: 2018-01-19 | Disposition: A | Discharge status: Home / Self Care | Payer: Medicare Other | Attending: Emergency Medicine | Admitting: Emergency Medicine

## 2018-01-19 ENCOUNTER — Other Ambulatory Visit: Payer: Self-pay

## 2018-01-19 DIAGNOSIS — Y92019 Unspecified place in single-family (private) house as the place of occurrence of the external cause: Secondary | ICD-10-CM | POA: Insufficient documentation

## 2018-01-19 DIAGNOSIS — W07XXXA Fall from chair, initial encounter: Secondary | ICD-10-CM | POA: Insufficient documentation

## 2018-01-19 DIAGNOSIS — I10 Essential (primary) hypertension: Secondary | ICD-10-CM | POA: Insufficient documentation

## 2018-01-19 DIAGNOSIS — Y999 Unspecified external cause status: Secondary | ICD-10-CM | POA: Diagnosis not present

## 2018-01-19 DIAGNOSIS — S7001XA Contusion of right hip, initial encounter: Secondary | ICD-10-CM | POA: Diagnosis not present

## 2018-01-19 DIAGNOSIS — M25551 Pain in right hip: Secondary | ICD-10-CM | POA: Diagnosis not present

## 2018-01-19 DIAGNOSIS — E119 Type 2 diabetes mellitus without complications: Secondary | ICD-10-CM | POA: Diagnosis not present

## 2018-01-19 DIAGNOSIS — Z79899 Other long term (current) drug therapy: Secondary | ICD-10-CM | POA: Diagnosis not present

## 2018-01-19 DIAGNOSIS — Y939 Activity, unspecified: Secondary | ICD-10-CM | POA: Diagnosis not present

## 2018-01-19 DIAGNOSIS — Z96641 Presence of right artificial hip joint: Secondary | ICD-10-CM | POA: Diagnosis not present

## 2018-01-19 DIAGNOSIS — Z7982 Long term (current) use of aspirin: Secondary | ICD-10-CM | POA: Diagnosis not present

## 2018-01-19 DIAGNOSIS — S79911A Unspecified injury of right hip, initial encounter: Secondary | ICD-10-CM | POA: Diagnosis not present

## 2018-01-19 NOTE — ED Triage Notes (Signed)
Pt c/o fall  From sitting onto wood floor x 2 hrs, right hip pain

## 2018-01-19 NOTE — Discharge Instructions (Addendum)
The x-ray of your right hip done today did not show any fracture or dislocation. Take Tylenol as needed for pain and please follow-up with your primary care provider for further evaluation. Return to ED for any worsening symptoms, additional injuries or falls, numbness in leg, severe back pain or headache.

## 2018-01-19 NOTE — ED Provider Notes (Signed)
[facility] EMERGENCY DEPARTMENT Provider Note   CSN: [id number] Arrival date & time: 01/19/18  1744     History   Chief Complaint Chief Complaint  Patient presents with  . Fall    HPI [name] [name] is a 72 y.o. female with a past medical history of hypertension, diabetes, hyperlipidemia, who presents to ED for evaluation of right hip pain and tenderness to palpation.  She was at home earlier today when she was sitting on an uneven, unsteady chair on a hardwood floor.  She reached down to pick up something under the chair when the chair fell over and she landed on her right side.  She has had pain in her hip then.  She denies any loss of consciousness.  She states that her entire right side of the body is slightly sore but more so in the right hip.  She had a right hip replacement approximately 5 years ago due to osteoarthritis.  She denies any headache, vision changes, vomiting, lower back pain, loss of bowel or bladder function, numbness in arms or legs.  She was able to get up and walk with a normal gait since the fall.  HPI  Past Medical History:  Diagnosis Date  . Arthritis   . Deep vein thrombosis ([place])    right calf - 05/2012   . Diabetes mellitus without complication (HCC)    diet controlled   . GERD (gastroesophageal reflux disease)   . Hyperlipidemia   . Hypertension    Ejection fraction =>55% Left ventricular systolic function is normal. Left ventricular wall motion is normal    . Lymphocytic colitis   . Neuropathy    diabetic - in bilateral feet   . Pityriasis lichenoides chronica   . Sleep apnea    bipap    Patient Active Problem List   Diagnosis Date Noted  . Chest pain 01/12/2015  . Right bundle branch block 05/20/2014  . OSA (obstructive sleep apnea) 03/12/2013  . Essential hypertension 03/12/2013  . Incomplete RBBB 03/12/2013  . Expected blood loss anemia 09/14/2012  . Obesity 09/14/2012  . Hyponatremia 09/14/2012  . S/P right THA, AA  09/13/2012    Past Surgical History:  Procedure Laterality Date  . DILATION AND CURETTAGE OF UTERUS    . HAMMER TOE SURGERY    . right hand surgery      due to blood infection   . torn meniscus repair      right knee   . TOTAL HIP ARTHROPLASTY  09/13/2012   Procedure: TOTAL HIP ARTHROPLASTY ANTERIOR APPROACH;  Surgeon: Mauri Pole, MD;  Location: [facility];  Service: Orthopedics;  Laterality: Right;     OB History   None      Home Medications    Prior to Admission medications   Medication Sig Start Date End Date Taking? Authorizing Provider  allopurinol (ZYLOPRIM) 100 MG tablet Take 100 mg by mouth 2 (two) times daily.    [provider]  aspirin 81 MG tablet Take 81 mg by mouth daily.    [provider]  Budesonide (UCERIS) 9 MG TB24 Take 1 tablet by mouth daily.    [provider]  Certolizumab Pegol (CIMZIA [place]) Inject into the skin.    [provider]  Cholecalciferol (VITAMIN D3) 1000 UNITS CAPS Take 1,000 Units by mouth 2 (two) times daily.     [provider]  citalopram (CELEXA) 20 MG tablet Take 20 mg by mouth daily.  [provider]  Cyanocobalamin (B-12 SL) Place 1 tablet under the tongue daily.    [provider]  cyclobenzaprine (FLEXERIL) 10 MG tablet Take 10 mg by mouth daily.     [provider]  FOLIC ACID PO Take 1 tablet by mouth daily.     [provider]  hydrochlorothiazide (MICROZIDE) 12.5 MG capsule Take 1 capsule (12.5 mg total) by mouth as needed (swelling). 11/10/17 02/08/18  Troy Sine, MD  lisinopril-hydrochlorothiazide (PRINZIDE,ZESTORETIC) 20-12.5 MG per tablet Take 1 tablet by mouth every morning.    [provider]  loperamide (IMODIUM) 2 MG capsule Take 6 mg by mouth 2 (two) times daily.    [provider]  MELATONIN SL Place 2 tablets under the tongue at bedtime.     [provider]  methotrexate (RHEUMATREX) 2.5 MG tablet Take 20 mg by  mouth once a week. Caution:Chemotherapy. Protect from light. 8 tablets, once a week    [provider]  mupirocin cream (BACTROBAN) 2 % Apply 1 application topically 2 (two) times daily as needed. For psoriasis.    [provider]  omeprazole (PRILOSEC) 40 MG capsule Take 40 mg by mouth at bedtime.    [provider]  PRESCRIPTION MEDICATION Place 3 drops into both ears as needed. Fluocinolone 0.01% Ear Drops. For inflammation and itching of the ear.    [provider]  Probiotic Product (ALIGN PO) Take 1 capsule by mouth daily.    [provider]  UNABLE TO FIND Pt takes Senzia (sp??)    [provider]    Family History Family History  Problem Relation Age of Onset  . Hypertension Mother     Social History Social History   Tobacco Use  . Smoking status: Never Smoker  . Smokeless tobacco: Never Used  Substance Use Topics  . Alcohol use: Yes    Comment: occasional wine   . Drug use: No     Allergies   Other and Sulfa antibiotics   Review of Systems Review of Systems  Constitutional: Negative for chills and fever.  Gastrointestinal: Negative for vomiting.  Musculoskeletal: Positive for arthralgias. Negative for back pain, gait problem, joint swelling, neck pain and neck stiffness.  Skin: Negative for wound.  Neurological: Negative for weakness, numbness and headaches.     Physical Exam Updated Vital Signs BP (!) 115/52 (BP Location: Right Arm)   Pulse 77   Temp 98 F (36.7 C) (Oral)   Resp 18   Ht 6\' 5"  (1.956 m)   Wt 108.9 kg (240 lb)   SpO2 97%   BMI 28.46 kg/m   Physical Exam  Constitutional: She appears well-developed and well-nourished. No distress.  HENT:  Head: Normocephalic and atraumatic.  Eyes: Pupils are equal, round, and reactive to light. Conjunctivae and EOM are normal. No scleral icterus.  Neck: Normal range of motion.  Cardiovascular: Normal rate, regular rhythm and normal heart sounds.    Pulmonary/Chest: Effort normal. No respiratory distress.  Musculoskeletal:  No midline spinal tenderness present in lumbar, thoracic or cervical spine. No step-off palpated. No visible bruising, edema or temperature change noted. No objective signs of numbness present. No saddle anesthesia. 2+ DP pulses bilaterally. Sensation intact to light touch. Strength 5/5 in bilateral lower extremities. Tenderness to palpation of the right hip.  No changes to range of motion noted.  Neurological: She is alert.  Skin: No rash noted. She is not diaphoretic.  Psychiatric: She has a normal mood and affect.  Nursing note and vitals reviewed.    ED Treatments / Results  Labs (all labs ordered are listed, but only abnormal results are displayed) Labs Reviewed - No data to display  EKG None  Radiology Dg Hip Unilat W Or Wo Pelvis 2-3 Views Right  Result Date: 01/19/2018 CLINICAL DATA:  Pain after fall. EXAM: DG HIP (WITH OR WITHOUT PELVIS) 2-3V RIGHT COMPARISON:  None. FINDINGS: The patient is status post right hip replacement. Hardware is in good position. No fractures are seen. No dislocations. IMPRESSION: Right hip replacement.  No fracture or dislocation. Electronically Signed   By: Dorise Bullion III M.D   On: 01/19/2018 18:08    Procedures Procedures (including critical care time)  Medications Ordered in ED Medications - No data to display   Initial Impression / Assessment and Plan / ED Course  I have reviewed the triage vital signs and the nursing notes.  Pertinent labs & imaging results that were available during my care of the patient were reviewed by me and considered in my medical decision making (see chart for details).     72 year old female with past medical history of hypertension, hyperlipidemia, autoimmune disease, presents for evaluation of right hip pain and tenderness to palpation.  She was sitting in an unsteady/uneven chair on a hardwood floor at her home prior to  arrival.  She reached down to pick something up off the floor under the chair when the chair gave out from under her.  She landed on her right side.  She is status post right hip replacement 5 years ago for osteoarthritis.  She was able to ambulate after the incident occurred.  She denies any loss of consciousness, headache, vision changes, neck pain or back pain.  On physical exam she is overall well-appearing.  No deficits on neurological exam noted.  No C, T or L-spine tenderness to palpation.  She does have tenderness to palpation of the right hip with no changes to range of motion noted.  X-ray of the hip shows hardware intact with no fracture dislocation.  Suspect that symptoms are due to a contusion.  Will advise pain medication as needed and follow-up with PCP for further evaluation.  Advised to return to ED for any severe worsening symptoms. Patient discussed with and seen by my attending, Dr. Laverta Baltimore.  Portions of this note were generated with [employer]. Dictation errors may occur despite best attempts at proofreading.   Final Clinical Impressions(s) / ED Diagnoses   Final diagnoses:  Contusion of right hip, initial encounter    ED Discharge Orders    None       Delia Heady, PA-C 01/19/18 1904    Margette Fast, MD 01/20/18 1344

## 2018-02-02 DIAGNOSIS — S8001XA Contusion of right knee, initial encounter: Secondary | ICD-10-CM | POA: Diagnosis not present

## 2018-02-02 DIAGNOSIS — M25561 Pain in right knee: Secondary | ICD-10-CM | POA: Diagnosis not present

## 2018-02-02 DIAGNOSIS — M1711 Unilateral primary osteoarthritis, right knee: Secondary | ICD-10-CM | POA: Diagnosis not present

## 2018-02-07 DIAGNOSIS — L4059 Other psoriatic arthropathy: Secondary | ICD-10-CM | POA: Diagnosis not present

## 2018-03-07 DIAGNOSIS — F419 Anxiety disorder, unspecified: Secondary | ICD-10-CM | POA: Diagnosis not present

## 2018-03-07 DIAGNOSIS — L408 Other psoriasis: Secondary | ICD-10-CM | POA: Diagnosis not present

## 2018-03-07 DIAGNOSIS — E1165 Type 2 diabetes mellitus with hyperglycemia: Secondary | ICD-10-CM | POA: Diagnosis not present

## 2018-03-07 DIAGNOSIS — Z79899 Other long term (current) drug therapy: Secondary | ICD-10-CM | POA: Diagnosis not present

## 2018-03-07 DIAGNOSIS — Z7984 Long term (current) use of oral hypoglycemic drugs: Secondary | ICD-10-CM | POA: Diagnosis not present

## 2018-03-07 DIAGNOSIS — Z6841 Body Mass Index (BMI) 40.0 and over, adult: Secondary | ICD-10-CM | POA: Diagnosis not present

## 2018-03-07 DIAGNOSIS — G4733 Obstructive sleep apnea (adult) (pediatric): Secondary | ICD-10-CM | POA: Diagnosis not present

## 2018-03-07 DIAGNOSIS — R05 Cough: Secondary | ICD-10-CM | POA: Diagnosis not present

## 2018-03-07 DIAGNOSIS — E119 Type 2 diabetes mellitus without complications: Secondary | ICD-10-CM | POA: Diagnosis not present

## 2018-03-07 DIAGNOSIS — L4059 Other psoriatic arthropathy: Secondary | ICD-10-CM | POA: Diagnosis not present

## 2018-03-07 DIAGNOSIS — M1009 Idiopathic gout, multiple sites: Secondary | ICD-10-CM | POA: Diagnosis not present

## 2018-03-07 DIAGNOSIS — M2550 Pain in unspecified joint: Secondary | ICD-10-CM | POA: Diagnosis not present

## 2018-03-07 DIAGNOSIS — K5289 Other specified noninfective gastroenteritis and colitis: Secondary | ICD-10-CM | POA: Diagnosis not present

## 2018-03-07 DIAGNOSIS — I10 Essential (primary) hypertension: Secondary | ICD-10-CM | POA: Diagnosis not present

## 2018-03-08 DIAGNOSIS — L4059 Other psoriatic arthropathy: Secondary | ICD-10-CM | POA: Diagnosis not present

## 2018-04-05 DIAGNOSIS — K5289 Other specified noninfective gastroenteritis and colitis: Secondary | ICD-10-CM | POA: Diagnosis not present

## 2018-04-05 DIAGNOSIS — R740 Nonspecific elevation of levels of transaminase and lactic acid dehydrogenase [LDH]: Secondary | ICD-10-CM | POA: Diagnosis not present

## 2018-04-06 DIAGNOSIS — L4059 Other psoriatic arthropathy: Secondary | ICD-10-CM | POA: Diagnosis not present

## 2018-04-15 ENCOUNTER — Ambulatory Visit: Payer: Medicare Other | Admitting: Podiatry

## 2018-04-22 DIAGNOSIS — M25571 Pain in right ankle and joints of right foot: Secondary | ICD-10-CM | POA: Diagnosis not present

## 2018-04-22 DIAGNOSIS — S92354A Nondisplaced fracture of fifth metatarsal bone, right foot, initial encounter for closed fracture: Secondary | ICD-10-CM | POA: Diagnosis not present

## 2018-04-25 DIAGNOSIS — M25571 Pain in right ankle and joints of right foot: Secondary | ICD-10-CM | POA: Insufficient documentation

## 2018-05-01 ENCOUNTER — Emergency Department (HOSPITAL_BASED_OUTPATIENT_CLINIC_OR_DEPARTMENT_OTHER): Payer: Medicare Other

## 2018-05-01 ENCOUNTER — Other Ambulatory Visit: Payer: Self-pay

## 2018-05-01 ENCOUNTER — Encounter (HOSPITAL_BASED_OUTPATIENT_CLINIC_OR_DEPARTMENT_OTHER): Payer: Self-pay | Admitting: *Deleted

## 2018-05-01 ENCOUNTER — Inpatient Hospital Stay (HOSPITAL_BASED_OUTPATIENT_CLINIC_OR_DEPARTMENT_OTHER)
Admission: EM | Admit: 2018-05-01 | Discharge: 2018-05-03 | DRG: 638 | Disposition: A | Discharge status: Home Health | Payer: Medicare Other | Attending: Internal Medicine | Admitting: Internal Medicine

## 2018-05-01 DIAGNOSIS — Z7982 Long term (current) use of aspirin: Secondary | ICD-10-CM

## 2018-05-01 DIAGNOSIS — Z86718 Personal history of other venous thrombosis and embolism: Secondary | ICD-10-CM

## 2018-05-01 DIAGNOSIS — Z79899 Other long term (current) drug therapy: Secondary | ICD-10-CM

## 2018-05-01 DIAGNOSIS — G4733 Obstructive sleep apnea (adult) (pediatric): Secondary | ICD-10-CM | POA: Diagnosis not present

## 2018-05-01 DIAGNOSIS — Z882 Allergy status to sulfonamides status: Secondary | ICD-10-CM

## 2018-05-01 DIAGNOSIS — E118 Type 2 diabetes mellitus with unspecified complications: Secondary | ICD-10-CM

## 2018-05-01 DIAGNOSIS — Z96641 Presence of right artificial hip joint: Secondary | ICD-10-CM | POA: Diagnosis present

## 2018-05-01 DIAGNOSIS — K219 Gastro-esophageal reflux disease without esophagitis: Secondary | ICD-10-CM | POA: Diagnosis present

## 2018-05-01 DIAGNOSIS — E11610 Type 2 diabetes mellitus with diabetic neuropathic arthropathy: Secondary | ICD-10-CM | POA: Diagnosis present

## 2018-05-01 DIAGNOSIS — R69 Illness, unspecified: Secondary | ICD-10-CM

## 2018-05-01 DIAGNOSIS — E11628 Type 2 diabetes mellitus with other skin complications: Secondary | ICD-10-CM | POA: Diagnosis not present

## 2018-05-01 DIAGNOSIS — E872 Acidosis: Secondary | ICD-10-CM | POA: Diagnosis present

## 2018-05-01 DIAGNOSIS — L97509 Non-pressure chronic ulcer of other part of unspecified foot with unspecified severity: Secondary | ICD-10-CM | POA: Diagnosis not present

## 2018-05-01 DIAGNOSIS — S92901D Unspecified fracture of right foot, subsequent encounter for fracture with routine healing: Secondary | ICD-10-CM

## 2018-05-01 DIAGNOSIS — E119 Type 2 diabetes mellitus without complications: Secondary | ICD-10-CM

## 2018-05-01 DIAGNOSIS — L4050 Arthropathic psoriasis, unspecified: Secondary | ICD-10-CM | POA: Diagnosis present

## 2018-05-01 DIAGNOSIS — L409 Psoriasis, unspecified: Secondary | ICD-10-CM | POA: Diagnosis present

## 2018-05-01 DIAGNOSIS — L089 Local infection of the skin and subcutaneous tissue, unspecified: Secondary | ICD-10-CM

## 2018-05-01 DIAGNOSIS — L03032 Cellulitis of left toe: Secondary | ICD-10-CM | POA: Diagnosis not present

## 2018-05-01 DIAGNOSIS — S91302A Unspecified open wound, left foot, initial encounter: Secondary | ICD-10-CM | POA: Diagnosis not present

## 2018-05-01 DIAGNOSIS — E11621 Type 2 diabetes mellitus with foot ulcer: Secondary | ICD-10-CM | POA: Diagnosis not present

## 2018-05-01 DIAGNOSIS — X58XXXD Exposure to other specified factors, subsequent encounter: Secondary | ICD-10-CM | POA: Diagnosis present

## 2018-05-01 DIAGNOSIS — E1140 Type 2 diabetes mellitus with diabetic neuropathy, unspecified: Secondary | ICD-10-CM

## 2018-05-01 DIAGNOSIS — S91205A Unspecified open wound of left lesser toe(s) with damage to nail, initial encounter: Secondary | ICD-10-CM | POA: Diagnosis present

## 2018-05-01 DIAGNOSIS — I10 Essential (primary) hypertension: Secondary | ICD-10-CM | POA: Diagnosis present

## 2018-05-01 LAB — CBC WITH DIFFERENTIAL/PLATELET
Basophils Absolute: 0 10*3/uL (ref 0.0–0.1)
Basophils Relative: 0 %
Eosinophils Absolute: 0.2 10*3/uL (ref 0.0–0.7)
Eosinophils Relative: 2 %
HCT: 38 % (ref 36.0–46.0)
Hemoglobin: 12.6 g/dL (ref 12.0–15.0)
Lymphocytes Relative: 25 %
Lymphs Abs: 2.7 10*3/uL (ref 0.7–4.0)
MCH: 31.3 pg (ref 26.0–34.0)
MCHC: 33.2 g/dL (ref 30.0–36.0)
MCV: 94.3 fL (ref 78.0–100.0)
Monocytes Absolute: 0.7 10*3/uL (ref 0.1–1.0)
Monocytes Relative: 6 %
Neutro Abs: 7.3 10*3/uL (ref 1.7–7.7)
Neutrophils Relative %: 67 %
Platelets: 242 10*3/uL (ref 150–400)
RBC: 4.03 MIL/uL (ref 3.87–5.11)
RDW: 14.9 % (ref 11.5–15.5)
WBC: 10.9 10*3/uL — ABNORMAL HIGH (ref 4.0–10.5)

## 2018-05-01 LAB — COMPREHENSIVE METABOLIC PANEL
ALT: 30 U/L (ref 0–44)
AST: 26 U/L (ref 15–41)
Albumin: 3.1 g/dL — ABNORMAL LOW (ref 3.5–5.0)
Alkaline Phosphatase: 64 U/L (ref 38–126)
Anion gap: 11 (ref 5–15)
BUN: 23 mg/dL (ref 8–23)
CO2: 28 mmol/L (ref 22–32)
Calcium: 9.3 mg/dL (ref 8.9–10.3)
Chloride: 96 mmol/L — ABNORMAL LOW (ref 98–111)
Creatinine, Ser: 0.95 mg/dL (ref 0.44–1.00)
GFR calc Af Amer: >60 mL/min (ref >60)
GFR calc non Af Amer: 58 mL/min — ABNORMAL LOW (ref >60)
Glucose, Bld: 236 mg/dL — ABNORMAL HIGH (ref 70–99)
Potassium: 3.6 mmol/L (ref 3.5–5.1)
Sodium: 135 mmol/L (ref 135–145)
Total Bilirubin: 0.3 mg/dL (ref 0.3–1.2)
Total Protein: 6.5 g/dL (ref 6.5–8.1)

## 2018-05-01 LAB — CBG MONITORING, ED: Glucose-Capillary: 239 mg/dL — ABNORMAL HIGH (ref 70–99)

## 2018-05-01 MED ORDER — SODIUM CHLORIDE 0.9 % IV SOLN
Freq: Once | INTRAVENOUS | Status: AC
  Administered 2018-05-02: 01:00:00 via INTRAVENOUS

## 2018-05-01 MED ORDER — VANCOMYCIN HCL IN DEXTROSE 1-5 GM/200ML-% IV SOLN
1000.0000 mg | Freq: Once | INTRAVENOUS | Status: AC
  Administered 2018-05-01: 1000 mg via INTRAVENOUS
  Filled 2018-05-01: qty 200

## 2018-05-01 MED ORDER — SODIUM CHLORIDE 0.9 % IV BOLUS
500.0000 mL | Freq: Once | INTRAVENOUS | Status: AC
  Administered 2018-05-01: 500 mL via INTRAVENOUS

## 2018-05-01 MED ORDER — SODIUM CHLORIDE 0.9 % IV SOLN
1.0000 g | Freq: Once | INTRAVENOUS | Status: AC
  Administered 2018-05-01: 1 g via INTRAVENOUS

## 2018-05-01 NOTE — ED Provider Notes (Addendum)
[facility] Provider Note   CSN: [id number] Arrival date & time: 05/01/18  1952     History   Chief Complaint Chief Complaint  Patient presents with  . Wound Check    HPI [name] [name] is a 72 y.o. female.  HPI Patient has had a cast on her right foot for a Jones fracture.  She reports that she has been getting around her house but she thinks that she has been rubbing the cast on her left foot when she is lying down or sleeping and in order for her to move about her house she has 2 somewhat drag the left foot to compensate for the right cast.  Patient has fairly severe neuropathy.  She reports that she been wearing a sock on the left foot and had not noticed that she was developing a wound on the back of the foot and that the second toe was getting extremely red.  She identified these problems today.  She reports she does have some throbbing in the toe on the foot now.  She is very concerned for infection.  She reports that the foot in the cast does not have any pain and seems fine.  Is her other foot that is now the problem.  She has not documented a fever.  Patient is diabetic. Past Medical History:  Diagnosis Date  . Arthritis   . Deep vein thrombosis ([place])    right calf - 05/2012   . Diabetes mellitus without complication (HCC)    diet controlled   . GERD (gastroesophageal reflux disease)   . Hyperlipidemia   . Hypertension    Ejection fraction =>55% Left ventricular systolic function is normal. Left ventricular wall motion is normal    . Lymphocytic colitis   . Neuropathy    diabetic - in bilateral feet   . Pityriasis lichenoides chronica   . Sleep apnea    bipap    Patient Active Problem List   Diagnosis Date Noted  . Chest pain 01/12/2015  . Right bundle branch block 05/20/2014  . OSA (obstructive sleep apnea) 03/12/2013  . Essential hypertension 03/12/2013  . Incomplete RBBB 03/12/2013  . Expected blood loss anemia 09/14/2012  .  Obesity 09/14/2012  . Hyponatremia 09/14/2012  . S/P right THA, AA 09/13/2012    Past Surgical History:  Procedure Laterality Date  . DILATION AND CURETTAGE OF UTERUS    . HAMMER TOE SURGERY    . right hand surgery      due to blood infection   . torn meniscus repair      right knee   . TOTAL HIP ARTHROPLASTY  09/13/2012   Procedure: TOTAL HIP ARTHROPLASTY ANTERIOR APPROACH;  Surgeon: Mauri Pole, MD;  Location: [facility];  Service: Orthopedics;  Laterality: Right;     OB History   None      Home Medications    Prior to Admission medications   Medication Sig Start Date End Date Taking? Authorizing Provider  allopurinol (ZYLOPRIM) 100 MG tablet Take 100 mg by mouth 2 (two) times daily.    [provider]  aspirin 81 MG tablet Take 81 mg by mouth daily.    [provider]  Budesonide (UCERIS) 9 MG TB24 Take 1 tablet by mouth daily.    [provider]  Certolizumab Pegol (CIMZIA [place]) Inject into the skin.    [provider]  Cholecalciferol (VITAMIN D3) 1000 UNITS CAPS Take 1,000 Units by mouth 2 (two)  times daily.     [provider]  citalopram (CELEXA) 20 MG tablet Take 20 mg by mouth daily.    [provider]  Cyanocobalamin (B-12 SL) Place 1 tablet under the tongue daily.    [provider]  cyclobenzaprine (FLEXERIL) 10 MG tablet Take 10 mg by mouth daily.     [provider]  FOLIC ACID PO Take 1 tablet by mouth daily.     [provider]  hydrochlorothiazide (MICROZIDE) 12.5 MG capsule Take 1 capsule (12.5 mg total) by mouth as needed (swelling). 11/10/17 02/08/18  Troy Sine, MD  lisinopril-hydrochlorothiazide (PRINZIDE,ZESTORETIC) 20-12.5 MG per tablet Take 1 tablet by mouth every morning.    [provider]  loperamide (IMODIUM) 2 MG capsule Take 6 mg by mouth 2 (two) times daily.    [provider]  MELATONIN SL Place 2 tablets under the tongue at bedtime.     [provider]  methotrexate (RHEUMATREX) 2.5 MG tablet Take 20 mg by mouth once a week. Caution:Chemotherapy. Protect from light. 8 tablets, once a week    [provider]  mupirocin cream (BACTROBAN) 2 % Apply 1 application topically 2 (two) times daily as needed. For psoriasis.    [provider]  omeprazole (PRILOSEC) 40 MG capsule Take 40 mg by mouth at bedtime.    [provider]  PRESCRIPTION MEDICATION Place 3 drops into both ears as needed. Fluocinolone 0.01% Ear Drops. For inflammation and itching of the ear.    [provider]  Probiotic Product (ALIGN PO) Take 1 capsule by mouth daily.    [provider]  UNABLE TO FIND Pt takes Senzia (sp??)    [provider]    Family History Family History  Problem Relation Age of Onset  . Hypertension Mother     Social History Social History   Tobacco Use  . Smoking status: Never Smoker  . Smokeless tobacco: Never Used  Substance Use Topics  . Alcohol use: Yes    Comment: occasional wine   . Drug use: No     Allergies   Other; Bee venom; and Sulfa antibiotics   Review of Systems Review of Systems 10 Systems reviewed and are negative for acute change except as noted in the HPI.   Physical Exam Updated Vital Signs BP (!) 141/68 (BP Location: Right Arm)   Pulse 87   Temp 98.5 F (36.9 C) (Oral)   Resp 18   Ht 5' 6.5" (1.689 m)   Wt 99.8 kg   SpO2 100%   BMI 34.98 kg/m   Physical Exam  Constitutional: She is oriented to person, place, and time.  Patient is alert and nontoxic.  Mental status clear.  No respiratory distress.  HENT:  Head: Normocephalic and atraumatic.  Eyes: EOM are normal.  Cardiovascular: Normal rate, regular rhythm, normal heart sounds and intact distal pulses.  Pulmonary/Chest: Effort normal and breath sounds normal.  Abdominal: Soft. She exhibits no distension. There is no tenderness.  Musculoskeletal:  Patient's right lower extremity is  in a lower leg cast.  Toes are warm and pink without erythema or swelling.  No swelling or redness above the cast.  Left foot has a severe erythema of the second toe with the nail having been avulsed.  Erythema spreads slightly onto the dorsum of the foot.  On the dorsum patient has an abrasion of approximately 2 cm with surrounding erythema.  Some mild general swelling of the foot.  The  attached images.  Neurological: She is alert and oriented to person, place, and time. She exhibits normal muscle tone. Coordination normal.  Skin: Skin is warm and dry.  Psychiatric: She has a normal mood and affect.         ED Treatments / Results  Labs (all labs ordered are listed, but only abnormal results are displayed) Labs Reviewed  COMPREHENSIVE METABOLIC PANEL - Abnormal; Notable for the following components:      Result Value   Chloride 96 (*)    Glucose, Bld 236 (*)    Albumin 3.1 (*)    GFR calc non Af Amer 58 (*)    All other components within normal limits  CBC WITH DIFFERENTIAL/PLATELET - Abnormal; Notable for the following components:   WBC 10.9 (*)    All other components within normal limits  CBG MONITORING, ED - Abnormal; Notable for the following components:   Glucose-Capillary 239 (*)    All other components within normal limits  CULTURE, BLOOD (ROUTINE X 2)  CULTURE, BLOOD (ROUTINE X 2)  URINALYSIS, ROUTINE W REFLEX MICROSCOPIC  I-STAT CG4 LACTIC ACID, ED  I-STAT CG4 LACTIC ACID, ED    EKG None  Radiology Dg Foot Complete Left  Result Date: 05/01/2018 CLINICAL DATA:  Wound to left foot EXAM: LEFT FOOT - COMPLETE 3+ VIEW COMPARISON:  01/17/2009 FINDINGS: Degenerative changes in the midfoot and hindfoot. No acute bony abnormality. Specifically, no fracture, subluxation, or dislocation. No radiographic changes of osteomyelitis. IMPRESSION: No acute bony abnormality. Electronically Signed   By: Rolm Baptise M.D.   On: 05/01/2018 22:54    Procedures Procedures (including  critical care time)  Medications Ordered in ED Medications  cefTRIAXone (ROCEPHIN) 1 g in sodium chloride 0.9 % 100 mL IVPB (1 g Intravenous New Bag/Given 05/01/18 2246)  vancomycin (VANCOCIN) IVPB 1000 mg/200 mL premix (has no administration in time range)  sodium chloride 0.9 % bolus 500 mL (500 mLs Intravenous New Bag/Given 05/01/18 2245)  0.9 %  sodium chloride infusion (has no administration in time range)     Initial Impression / Assessment and Plan / ED Course  I have reviewed the triage vital signs and the nursing notes.  Pertinent labs & imaging results that were available during my care of the patient were reviewed by me and considered in my medical decision making (see chart for details).  Clinical Course as of May 02 2307  [name] May 01, 2018  2148 Neutrophils: 14 [MP]    Clinical Course User Index [MP] Charlesetta Shanks, MD  Patient presents with severe erythema of the toe with an abraded wound were she appears to have avulsed the toenail.  Patient also has abrasion wound on the dorsum of the foot.  She has neuropathy and had not noticed the severity of these wounds developing under her sock.  Patient has lactic acidosis at 2.4.  White count mildly elevated.  Rocephin and vancomycin initiated for diabetic foot infection.  Patient's blood pressure, mental status and heart rate are stable.  At this time, sepsis protocol not initiated.  X-ray does not show evidence of osteomyelitis.  Plan for admission for continued IV antibiotics and observation.  Patient sees Dr. Doran Durand of orthopedics.  She is currently his patient for an otherwise stable appearing, casted Jones fracture on the right.  Consult : reviewed with Dr. Cathleen Corti for admission  Final Clinical Impressions(s) / ED Diagnoses   Final diagnoses:  Diabetic foot infection ([place])  Severe comorbid illness    ED  Discharge Orders    None       Charlesetta Shanks, MD 05/01/18 2308    Charlesetta Shanks, MD 05/01/18 Sarita Haver      Charlesetta Shanks, MD 05/01/18 [phone]

## 2018-05-01 NOTE — ED Notes (Signed)
Panic results of 2.46 LAC hand delivered to Dr. Johnney Killian @ 2117

## 2018-05-01 NOTE — ED Notes (Signed)
Called [facility] for consult to the hospitalist @ [place].

## 2018-05-01 NOTE — ED Triage Notes (Signed)
Pt reports she has a wound on her left foot from being rubbed by the cast she has on her right foot. She is concerned for infection

## 2018-05-02 ENCOUNTER — Inpatient Hospital Stay (HOSPITAL_COMMUNITY): Payer: Medicare Other

## 2018-05-02 ENCOUNTER — Encounter (HOSPITAL_COMMUNITY): Payer: Self-pay

## 2018-05-02 DIAGNOSIS — Z7982 Long term (current) use of aspirin: Secondary | ICD-10-CM | POA: Diagnosis not present

## 2018-05-02 DIAGNOSIS — Z96641 Presence of right artificial hip joint: Secondary | ICD-10-CM | POA: Diagnosis present

## 2018-05-02 DIAGNOSIS — L0390 Cellulitis, unspecified: Secondary | ICD-10-CM | POA: Diagnosis not present

## 2018-05-02 DIAGNOSIS — K219 Gastro-esophageal reflux disease without esophagitis: Secondary | ICD-10-CM | POA: Diagnosis present

## 2018-05-02 DIAGNOSIS — L03032 Cellulitis of left toe: Secondary | ICD-10-CM | POA: Diagnosis present

## 2018-05-02 DIAGNOSIS — S91205A Unspecified open wound of left lesser toe(s) with damage to nail, initial encounter: Secondary | ICD-10-CM | POA: Diagnosis present

## 2018-05-02 DIAGNOSIS — E118 Type 2 diabetes mellitus with unspecified complications: Secondary | ICD-10-CM

## 2018-05-02 DIAGNOSIS — S92901D Unspecified fracture of right foot, subsequent encounter for fracture with routine healing: Secondary | ICD-10-CM | POA: Diagnosis not present

## 2018-05-02 DIAGNOSIS — Z86718 Personal history of other venous thrombosis and embolism: Secondary | ICD-10-CM | POA: Diagnosis not present

## 2018-05-02 DIAGNOSIS — E872 Acidosis: Secondary | ICD-10-CM | POA: Diagnosis present

## 2018-05-02 DIAGNOSIS — I10 Essential (primary) hypertension: Secondary | ICD-10-CM | POA: Diagnosis present

## 2018-05-02 DIAGNOSIS — X58XXXD Exposure to other specified factors, subsequent encounter: Secondary | ICD-10-CM | POA: Diagnosis present

## 2018-05-02 DIAGNOSIS — E11610 Type 2 diabetes mellitus with diabetic neuropathic arthropathy: Secondary | ICD-10-CM | POA: Diagnosis present

## 2018-05-02 DIAGNOSIS — L409 Psoriasis, unspecified: Secondary | ICD-10-CM

## 2018-05-02 DIAGNOSIS — R69 Illness, unspecified: Secondary | ICD-10-CM

## 2018-05-02 DIAGNOSIS — L4050 Arthropathic psoriasis, unspecified: Secondary | ICD-10-CM | POA: Diagnosis present

## 2018-05-02 DIAGNOSIS — E11628 Type 2 diabetes mellitus with other skin complications: Secondary | ICD-10-CM | POA: Diagnosis present

## 2018-05-02 DIAGNOSIS — E119 Type 2 diabetes mellitus without complications: Secondary | ICD-10-CM

## 2018-05-02 DIAGNOSIS — L089 Local infection of the skin and subcutaneous tissue, unspecified: Secondary | ICD-10-CM

## 2018-05-02 DIAGNOSIS — Z882 Allergy status to sulfonamides status: Secondary | ICD-10-CM | POA: Diagnosis not present

## 2018-05-02 DIAGNOSIS — G4733 Obstructive sleep apnea (adult) (pediatric): Secondary | ICD-10-CM | POA: Diagnosis present

## 2018-05-02 DIAGNOSIS — E1140 Type 2 diabetes mellitus with diabetic neuropathy, unspecified: Secondary | ICD-10-CM | POA: Diagnosis present

## 2018-05-02 DIAGNOSIS — Z79899 Other long term (current) drug therapy: Secondary | ICD-10-CM | POA: Diagnosis not present

## 2018-05-02 LAB — CBC
HCT: 35.7 % — ABNORMAL LOW (ref 36.0–46.0)
Hemoglobin: 11.8 g/dL — ABNORMAL LOW (ref 12.0–15.0)
MCH: 31.2 pg (ref 26.0–34.0)
MCHC: 33.1 g/dL (ref 30.0–36.0)
MCV: 94.4 fL (ref 78.0–100.0)
Platelets: 268 10*3/uL (ref 150–400)
RBC: 3.78 MIL/uL — ABNORMAL LOW (ref 3.87–5.11)
RDW: 15.2 % (ref 11.5–15.5)
WBC: 10.3 10*3/uL (ref 4.0–10.5)

## 2018-05-02 LAB — URINALYSIS, ROUTINE W REFLEX MICROSCOPIC
Bilirubin Urine: NEGATIVE
Glucose, UA: 150 mg/dL — AB
Hgb urine dipstick: NEGATIVE
Ketones, ur: NEGATIVE mg/dL
Leukocytes, UA: NEGATIVE
Nitrite: NEGATIVE
Protein, ur: NEGATIVE mg/dL
Specific Gravity, Urine: 1.016 (ref 1.005–1.030)
pH: 6 (ref 5.0–8.0)

## 2018-05-02 LAB — CREATININE, SERUM
Creatinine, Ser: 0.72 mg/dL (ref 0.44–1.00)
GFR calc Af Amer: >60 mL/min (ref >60)
GFR calc non Af Amer: >60 mL/min (ref >60)

## 2018-05-02 LAB — GLUCOSE, CAPILLARY
Glucose-Capillary: 135 mg/dL — ABNORMAL HIGH (ref 70–99)
Glucose-Capillary: 180 mg/dL — ABNORMAL HIGH (ref 70–99)
Glucose-Capillary: 143 mg/dL — ABNORMAL HIGH (ref 70–99)
Glucose-Capillary: 192 mg/dL — ABNORMAL HIGH (ref 70–99)

## 2018-05-02 LAB — HIV ANTIBODY (ROUTINE TESTING W REFLEX): HIV Screen 4th Generation wRfx: NONREACTIVE

## 2018-05-02 LAB — I-STAT CG4 LACTIC ACID, ED
Lactic Acid, Venous: 1.79 mmol/L (ref 0.5–1.9)
Lactic Acid, Venous: 2.46 mmol/L (ref 0.5–1.9)

## 2018-05-02 LAB — SEDIMENTATION RATE: Sed Rate: 52 mm/hr — ABNORMAL HIGH (ref 0–22)

## 2018-05-02 LAB — HEMOGLOBIN A1C
Hgb A1c MFr Bld: 7.5 % — ABNORMAL HIGH (ref 4.8–5.6)
Mean Plasma Glucose: 168.55 mg/dL

## 2018-05-02 LAB — PREALBUMIN: Prealbumin: 16.9 mg/dL — ABNORMAL LOW (ref 18–38)

## 2018-05-02 LAB — C-REACTIVE PROTEIN: CRP: 1.2 mg/dL — ABNORMAL HIGH (ref <1.0)

## 2018-05-02 MED ORDER — ASPIRIN EC 81 MG PO TBEC
81.0000 mg | DELAYED_RELEASE_TABLET | ORAL | Status: DC
  Filled 2018-05-02: qty 1

## 2018-05-02 MED ORDER — BUDESONIDE 3 MG PO CPEP
9.0000 mg | ORAL_CAPSULE | Freq: Every day | ORAL | Status: DC
  Administered 2018-05-02: 9 mg via ORAL
  Filled 2018-05-02 (×2): qty 3

## 2018-05-02 MED ORDER — FOLIC ACID 1 MG PO TABS
1.0000 mg | ORAL_TABLET | Freq: Every day | ORAL | Status: DC
  Administered 2018-05-02: 1 mg via ORAL
  Filled 2018-05-02 (×2): qty 1

## 2018-05-02 MED ORDER — ALLOPURINOL 100 MG PO TABS
100.0000 mg | ORAL_TABLET | Freq: Two times a day (BID) | ORAL | Status: DC
  Administered 2018-05-02 (×3): 100 mg via ORAL
  Filled 2018-05-02 (×4): qty 1

## 2018-05-02 MED ORDER — SODIUM CHLORIDE 0.9 % IV SOLN
2.0000 g | INTRAVENOUS | Status: DC
  Administered 2018-05-02: 2 g via INTRAVENOUS
  Filled 2018-05-02: qty 2

## 2018-05-02 MED ORDER — VANCOMYCIN HCL IN DEXTROSE 1-5 GM/200ML-% IV SOLN
1000.0000 mg | INTRAVENOUS | Status: DC
  Administered 2018-05-02: 1000 mg via INTRAVENOUS
  Filled 2018-05-02 (×2): qty 200

## 2018-05-02 MED ORDER — CITALOPRAM HYDROBROMIDE 20 MG PO TABS
20.0000 mg | ORAL_TABLET | Freq: Every day | ORAL | Status: DC
  Administered 2018-05-02: 20 mg via ORAL
  Filled 2018-05-02 (×2): qty 1

## 2018-05-02 MED ORDER — LOPERAMIDE HCL 2 MG PO CAPS
6.0000 mg | ORAL_CAPSULE | ORAL | Status: DC | PRN
  Administered 2018-05-02 (×2): 6 mg via ORAL
  Filled 2018-05-02 (×2): qty 3

## 2018-05-02 MED ORDER — CYCLOBENZAPRINE HCL 10 MG PO TABS: 10.0000 mg | ORAL_TABLET | Freq: Every day | ORAL | Status: DC

## 2018-05-02 MED ORDER — METRONIDAZOLE 500 MG PO TABS
500.0000 mg | ORAL_TABLET | Freq: Three times a day (TID) | ORAL | Status: DC
  Administered 2018-05-02 – 2018-05-03 (×4): 500 mg via ORAL
  Filled 2018-05-02 (×4): qty 1

## 2018-05-02 MED ORDER — METFORMIN HCL ER 500 MG PO TB24: 500.0000 mg | ORAL_TABLET | Freq: Every day | ORAL | Status: DC

## 2018-05-02 MED ORDER — INSULIN ASPART 100 UNIT/ML ~~LOC~~ SOLN: 0.0000 [IU] | Freq: Three times a day (TID) | SUBCUTANEOUS | Status: DC

## 2018-05-02 MED ORDER — CYCLOBENZAPRINE HCL 10 MG PO TABS
10.0000 mg | ORAL_TABLET | Freq: Once | ORAL | Status: AC
  Administered 2018-05-02: 10 mg via ORAL

## 2018-05-02 MED ORDER — INSULIN ASPART 100 UNIT/ML ~~LOC~~ SOLN
0.0000 [IU] | Freq: Three times a day (TID) | SUBCUTANEOUS | Status: DC
  Administered 2018-05-02: 3 [IU] via SUBCUTANEOUS
  Administered 2018-05-02 – 2018-05-03 (×3): 2 [IU] via SUBCUTANEOUS

## 2018-05-02 MED ORDER — LISINOPRIL-HYDROCHLOROTHIAZIDE 20-12.5 MG PO TABS: 1.0000 | ORAL_TABLET | Freq: Every morning | ORAL | Status: DC

## 2018-05-02 MED ORDER — OXYCODONE HCL 5 MG PO TABS
5.0000 mg | ORAL_TABLET | ORAL | Status: DC | PRN
  Administered 2018-05-02 – 2018-05-03 (×3): 5 mg via ORAL
  Filled 2018-05-02 (×3): qty 1

## 2018-05-02 MED ORDER — VITAMIN D 1000 UNITS PO TABS
1000.0000 [IU] | ORAL_TABLET | Freq: Two times a day (BID) | ORAL | Status: DC
  Administered 2018-05-02 (×3): 1000 [IU] via ORAL
  Filled 2018-05-02 (×4): qty 1

## 2018-05-02 MED ORDER — ACETAMINOPHEN 325 MG PO TABS
650.0000 mg | ORAL_TABLET | Freq: Once | ORAL | Status: AC
  Administered 2018-05-02: 650 mg via ORAL
  Filled 2018-05-02: qty 2

## 2018-05-02 MED ORDER — MUPIROCIN CALCIUM 2 % EX CREA
TOPICAL_CREAM | Freq: Every day | CUTANEOUS | Status: DC
  Administered 2018-05-02 – 2018-05-03 (×2): via TOPICAL
  Filled 2018-05-02: qty 15

## 2018-05-02 MED ORDER — PREMIER PROTEIN SHAKE
11.0000 [oz_av] | Freq: Two times a day (BID) | ORAL | Status: DC
  Filled 2018-05-02 (×4): qty 325.31

## 2018-05-02 MED ORDER — HYDROCHLOROTHIAZIDE 12.5 MG PO CAPS
12.5000 mg | ORAL_CAPSULE | Freq: Every day | ORAL | Status: DC
  Administered 2018-05-02: 12.5 mg via ORAL
  Filled 2018-05-02 (×2): qty 1

## 2018-05-02 MED ORDER — CYCLOBENZAPRINE HCL 10 MG PO TABS
10.0000 mg | ORAL_TABLET | Freq: Every day | ORAL | Status: DC
  Filled 2018-05-02: qty 1

## 2018-05-02 MED ORDER — OXYCODONE HCL 5 MG PO TABS: 10.0000 mg | ORAL_TABLET | ORAL | Status: DC | PRN

## 2018-05-02 MED ORDER — CYCLOBENZAPRINE HCL 10 MG PO TABS
10.0000 mg | ORAL_TABLET | Freq: Every day | ORAL | Status: DC
  Administered 2018-05-02: 10 mg via ORAL
  Filled 2018-05-02: qty 1

## 2018-05-02 MED ORDER — ENOXAPARIN SODIUM 40 MG/0.4ML ~~LOC~~ SOLN
40.0000 mg | SUBCUTANEOUS | Status: DC
  Administered 2018-05-02: 40 mg via SUBCUTANEOUS
  Filled 2018-05-02 (×2): qty 0.4

## 2018-05-02 MED ORDER — LISINOPRIL 20 MG PO TABS
20.0000 mg | ORAL_TABLET | Freq: Every day | ORAL | Status: DC
  Administered 2018-05-02: 20 mg via ORAL
  Filled 2018-05-02 (×2): qty 1

## 2018-05-02 MED ORDER — INSULIN ASPART 100 UNIT/ML ~~LOC~~ SOLN: 0.0000 [IU] | Freq: Every day | SUBCUTANEOUS | Status: DC

## 2018-05-02 MED ORDER — PANTOPRAZOLE SODIUM 40 MG PO TBEC
80.0000 mg | DELAYED_RELEASE_TABLET | Freq: Every day | ORAL | Status: DC
  Administered 2018-05-02: 80 mg via ORAL
  Filled 2018-05-02 (×2): qty 2

## 2018-05-02 NOTE — Progress Notes (Signed)
Pharmacy Antibiotic Note  MINSA [name] [name] is a 72 y.o. female admitted on 05/01/2018 with cellulitis.  Pharmacy has been consulted for vancomycin dosing.  Plan: Rocephin 2 Gm IV q24h (MD) Flagyl 500 mg IV q8h (MD) Vancomycin 1 Gm IV q24h for est AUC = 460 Goal AUC = 400-500 F/u scr/cultures  Height: 5' 6.5" (168.9 cm) Weight: 220 lb (99.8 kg) IBW/kg (Calculated) : 60.45  Temp (24hrs), Avg:98.5 F (36.9 C), Min:98.2 F (36.8 C), Max:98.7 F (37.1 C)  Recent Labs  Lab 05/01/18 2106  WBC 10.9*  CREATININE 0.95    Estimated Creatinine Clearance: 64.4 mL/min (by C-G formula based on SCr of 0.95 mg/dL).    Allergies  Allergen Reactions  . Other Shortness Of Breath    Blue fish: palms and feet turn red  . Bee Venom Swelling  . Sulfa Antibiotics Hives    Antimicrobials this admission: 9/22 rocephin >>  9/22 vancomycin >>  9/23 flagyl >>  Dose adjustments this admission:   Microbiology results:  BCx:   UCx:    Sputum:    MRSA PCR:   Thank you for allowing pharmacy to be a part of this patient's care.  Dorrene German 05/02/2018 3:32 AM

## 2018-05-02 NOTE — Progress Notes (Signed)
Initial Nutrition Assessment  DOCUMENTATION CODES:   Obesity unspecified  INTERVENTION:   Provide Premier Protein BID, each supplement provides 160 kcal and 30 grams of protein.   NUTRITION DIAGNOSIS:   Increased nutrient needs related to wound healing as evidenced by estimated needs.  GOAL:   Patient will meet greater than or equal to 90% of their needs  MONITOR:   PO intake, Supplement acceptance, Labs, Weight trends, I & O's, Skin  REASON FOR ASSESSMENT:   Consult Wound healing  ASSESSMENT:   72 y.o. female with medical history significant of DM2, HTN, psoriasis with arthritis, lymphocytic colitis, charcot's feet, OSA.  Patient now on CHO modified diet and consumed 75% of breakfast. Given left foot wound, pt wound benefit from nutritional supplement. RD placed order for Premier Protein BID.   Per weight records, pt has lost 20 lb since 6/12 (8% wt loss x 3 months, significant for time frame).   Medications: Vitamin D tablet BID, folic acid tablet daily Labs reviewed: CBGs: 135  NUTRITION - FOCUSED PHYSICAL EXAM:  Nutrition focused physical exam shows no sign of depletion of muscle mass or body fat.  Diet Order:   Diet Order            Diet Carb Modified Fluid consistency: Thin; Room service appropriate? Yes  Diet effective now              EDUCATION NEEDS:   No education needs have been identified at this time  Skin:  Skin Assessment: Skin Integrity Issues: Skin Integrity Issues:: Other (Comment) Other: left foot wound- infected  Last BM:  PTA  Height:   Ht Readings from Last 1 Encounters:  05/01/18 5' 6.5" (1.689 m)    Weight:   Wt Readings from Last 1 Encounters:  05/01/18 99.8 kg    Ideal Body Weight:  61.3 kg  BMI:  Body mass index is 34.98 kg/m.  Estimated Nutritional Needs:   Kcal:  1800-2000  Protein:  80-90g  Fluid:  2L/day  [name] Bibles, MS, RD, LDN [place] Dietitian Pager: [phone] After  Hours Pager: [phone]

## 2018-05-02 NOTE — Plan of Care (Signed)
Plan of care discussed with patient 

## 2018-05-02 NOTE — Progress Notes (Signed)
Spoke with patient at bedside. States she lives with her partner who recently fell and is in the hospital at [facility] with a brain bleed. She states at baseline she uses a w/c for mobility and sometimes a walker. She is NWB on her right foot. She has some difficulty with accessing her bathroom, will get her a 3n1 for home use. She has assistance at home with house keeping and meal prep, uses delivery service for groceries. Has a ramp on her house, her dtr (a physician at [employer]) assists with transportation to appointments. She manages her own meds, has no issues taking or obtaining meds. Awaiting MRI and will f/u with her on needs.

## 2018-05-02 NOTE — Progress Notes (Signed)
ABI completed Prelim:  Right ABI unable to complete due to leg in cast. Right TBI abnormal. Left ABI WNL. Left TBI abnormal.   Landry Mellow, RDMS, RVT

## 2018-05-02 NOTE — H&P (Signed)
History and Physical    [name] [name]:[id number] DOB: [birth year] DOA: 05/01/2018  PCP: Lanice Shirts, MD  Patient coming from: Home  I have personally briefly reviewed patient's old medical records in [place]  Chief Complaint: L foot infection  HPI: [name] [name] is a 72 y.o. female with medical history significant of DM2, HTN, psoriasis, lymphocytic colitis.  Patient is on chronic budesonide, MTX, and Cimzia for autoimmune issues.  Patient recently had Fx of R foot.  Has cast on R foot.  Unfortunately she thinks she has been rubbing feet together in her sleep and has abraded skin of L foot (R foot seems fine in cast).  L foot appears to have become infected.  Presents to ED.   ED Course: Put on rocephin / vanc.  WBC 10.9k, X ray shows no acute finding.   Review of Systems: As per HPI otherwise 10 point review of systems negative.   Past Medical History:  Diagnosis Date  . Arthritis   . Deep vein thrombosis ([place])    right calf - 05/2012   . Diabetes mellitus without complication (HCC)    diet controlled   . GERD (gastroesophageal reflux disease)   . Hyperlipidemia   . Hypertension    Ejection fraction =>55% Left ventricular systolic function is normal. Left ventricular wall motion is normal    . Lymphocytic colitis   . Neuropathy    diabetic - in bilateral feet   . Pityriasis lichenoides chronica   . Sleep apnea    bipap    Past Surgical History:  Procedure Laterality Date  . DILATION AND CURETTAGE OF UTERUS    . HAMMER TOE SURGERY    . right hand surgery      due to blood infection   . torn meniscus repair      right knee   . TOTAL HIP ARTHROPLASTY  09/13/2012   Procedure: TOTAL HIP ARTHROPLASTY ANTERIOR APPROACH;  Surgeon: Mauri Pole, MD;  Location: [facility];  Service: Orthopedics;  Laterality: Right;     reports that she has never smoked. She has never used smokeless tobacco. She reports that she drinks alcohol. She reports that she does  not use drugs.  Allergies  Allergen Reactions  . Other Shortness Of Breath    Blue fish: palms and feet turn red  . Bee Venom Swelling  . Sulfa Antibiotics Hives    Family History  Problem Relation Age of Onset  . Hypertension Mother      Prior to Admission medications   Medication Sig Start Date End Date Taking? Authorizing Provider  allopurinol (ZYLOPRIM) 100 MG tablet Take 100 mg by mouth 2 (two) times daily.    [provider]  aspirin 81 MG tablet Take 81 mg by mouth daily.    [provider]  Budesonide (UCERIS) 9 MG TB24 Take 1 tablet by mouth daily.    [provider]  Certolizumab Pegol (CIMZIA [place]) Inject into the skin.    [provider]  Cholecalciferol (VITAMIN D3) 1000 UNITS CAPS Take 1,000 Units by mouth 2 (two) times daily.     [provider]  citalopram (CELEXA) 20 MG tablet Take 20 mg by mouth daily.    [provider]  Cyanocobalamin (B-12 SL) Place 1 tablet under the tongue daily.    [provider]  cyclobenzaprine (FLEXERIL) 10 MG tablet Take 10 mg by mouth daily.     [provider]  FOLIC ACID PO  Take 1 tablet by mouth daily.     [provider]  hydrochlorothiazide (MICROZIDE) 12.5 MG capsule Take 1 capsule (12.5 mg total) by mouth as needed (swelling). 11/10/17 02/08/18  Troy Sine, MD  lisinopril-hydrochlorothiazide (PRINZIDE,ZESTORETIC) 20-12.5 MG per tablet Take 1 tablet by mouth every morning.    [provider]  loperamide (IMODIUM) 2 MG capsule Take 6 mg by mouth 2 (two) times daily.    [provider]  MELATONIN SL Place 2 tablets under the tongue at bedtime.     [provider]  methotrexate (RHEUMATREX) 2.5 MG tablet Take 20 mg by mouth once a week. Caution:Chemotherapy. Protect from light. 8 tablets, once a week    [provider]  mupirocin cream (BACTROBAN) 2 % Apply 1 application topically 2 (two) times daily as needed. For  psoriasis.    [provider]  omeprazole (PRILOSEC) 40 MG capsule Take 40 mg by mouth at bedtime.    [provider]  PRESCRIPTION MEDICATION Place 3 drops into both ears as needed. Fluocinolone 0.01% Ear Drops. For inflammation and itching of the ear.    [provider]  Probiotic Product (ALIGN PO) Take 1 capsule by mouth daily.    [provider]    Physical Exam: Vitals:   05/01/18 2000 05/01/18 2340 05/02/18 0215 05/02/18 0255  BP: (!) 141/68 (!) 126/50 (!) 132/49 (!) 161/71  Pulse: 87 83 78 90  Resp: 18 19 18 16   Temp: 98.5 F (36.9 C) 98.7 F (37.1 C)  98.2 F (36.8 C)  TempSrc: Oral Oral  Oral  SpO2: 100% 99% 99% 100%  Weight: 99.8 kg     Height: 5' 6.5" (1.689 m)       Constitutional: NAD, calm, comfortable Eyes: PERRL, lids and conjunctivae normal ENMT: Mucous membranes are moist. Posterior pharynx clear of any exudate or lesions.Normal dentition.  Neck: normal, supple, no masses, no thyromegaly Respiratory: clear to auscultation bilaterally, no wheezing, no crackles. Normal respiratory effort. No accessory muscle use.  Cardiovascular: Regular rate and rhythm, no murmurs / rubs / gallops. No extremity edema. 2+ pedal pulses. No carotid bruits.  Abdomen: no tenderness, no masses palpated. No hepatosplenomegaly. Bowel sounds positive.  Musculoskeletal: no clubbing / cyanosis. No joint deformity upper and lower extremities. Good ROM, no contractures. Normal muscle tone.  Skin:    Neurologic: CN 2-12 grossly intact. Sensation intact, DTR normal. Strength 5/5 in all 4.  Psychiatric: Normal judgment and insight. Alert and oriented x 3. Normal mood.    Labs on Admission: I have personally reviewed following labs and imaging studies  CBC: Recent Labs  Lab 05/01/18 2106  WBC 10.9*  NEUTROABS 7.3  HGB 12.6  HCT 38.0  MCV 94.3  PLT 759   Basic Metabolic Panel: Recent Labs  Lab 05/01/18 2106  NA 135  K 3.6  CL 96*  CO2 28    GLUCOSE 236*  BUN 23  CREATININE 0.95  CALCIUM 9.3   GFR: Estimated Creatinine Clearance: 64.4 mL/min (by C-G formula based on SCr of 0.95 mg/dL). Liver Function Tests: Recent Labs  Lab 05/01/18 2106  AST 26  ALT 30  ALKPHOS 64  BILITOT 0.3  PROT 6.5  ALBUMIN 3.1*   No results for input(s): LIPASE, AMYLASE in the last 168 hours. No results for input(s): AMMONIA in the last 168 hours. Coagulation Profile: No results for input(s): INR, PROTIME in the last 168 hours. Cardiac Enzymes: No results for input(s): CKTOTAL, CKMB, CKMBINDEX, TROPONINI in  the last 168 hours. BNP (last 3 results) No results for input(s): PROBNP in the last 8760 hours. HbA1C: No results for input(s): HGBA1C in the last 72 hours. CBG: Recent Labs  Lab 05/01/18 2101  GLUCAP 239*   Lipid Profile: No results for input(s): CHOL, HDL, LDLCALC, TRIG, CHOLHDL, LDLDIRECT in the last 72 hours. Thyroid Function Tests: No results for input(s): TSH, T4TOTAL, FREET4, T3FREE, THYROIDAB in the last 72 hours. Anemia Panel: No results for input(s): VITAMINB12, FOLATE, FERRITIN, TIBC, IRON, RETICCTPCT in the last 72 hours. Urine analysis:    Component Value Date/Time   COLORURINE YELLOW 09/06/2012 1046   APPEARANCEUR CLOUDY (A) 09/06/2012 1046   LABSPEC 1.011 09/06/2012 1046   PHURINE 7.5 09/06/2012 1046   GLUCOSEU NEGATIVE 09/06/2012 1046   HGBUR NEGATIVE 09/06/2012 1046   BILIRUBINUR NEGATIVE 09/06/2012 1046   KETONESUR NEGATIVE 09/06/2012 1046   PROTEINUR NEGATIVE 09/06/2012 1046   UROBILINOGEN 1.0 09/06/2012 1046   NITRITE NEGATIVE 09/06/2012 1046   LEUKOCYTESUR MODERATE (A) 09/06/2012 1046    Radiological Exams on Admission: Dg Foot Complete Left  Result Date: 05/01/2018 CLINICAL DATA:  Wound to left foot EXAM: LEFT FOOT - COMPLETE 3+ VIEW COMPARISON:  01/17/2009 FINDINGS: Degenerative changes in the midfoot and hindfoot. No acute bony abnormality. Specifically, no fracture, subluxation, or  dislocation. No radiographic changes of osteomyelitis. IMPRESSION: No acute bony abnormality. Electronically Signed   By: Rolm Baptise M.D.   On: 05/01/2018 22:54    EKG: Independently reviewed.  Assessment/Plan Principal Problem:   Cellulitis of toe of left foot Active Problems:   Essential hypertension   DM2 (diabetes mellitus, type 2) (HCC)   Psoriasis    1. Cellulitis of toe of L foot - 1. Foot wound infection pathway 2. Wound care consult 3. Possibly call Dr. Doran Durand in AM 4. Rocephin / flagyl / vanc given immunosuppressed status 5. BCx pending 2. HTN - continue home lisinopril-hctz 3. DM2 - 1. A1C pending 2. Continue metformin 4. Psoriasis - 1. Continue budesonide 2. Next MTX due Friday 3. Next Cimzia not due this week according to patient (she needs to look up exactly when).  DVT prophylaxis: Lovenox Code Status: Full Family Communication: No family in room Disposition Plan: Home after admit Consults called: None Admission status: Admit to inpatient - IP status due to immunosuppressed patient on multiple immunosuppressants needing IV vancomycin treatment.   Etta Quill DO [facility] Pager [phone] Only works nights!  If 7AM-7PM, please contact the primary day team physician taking care of patient  [email or web address] Password [email or web address]  05/02/2018, 3:38 AM

## 2018-05-02 NOTE — Consult Note (Addendum)
[place] Nurse wound consult note Reason for Consult: Consult requested for left foot.   Wound type: Full thickness wound to anterior foot and tip of left 2nd toe. Measurement: Anterior foot wound with yellow slough which is loose and removed easily with scissors, revealing 100% red moist woundbed, small amt bloody drainage, no odor or fluctuance.  1X1X.1cm full thickness wound. Left 2nd toe tip with full thickness wound where previous toemail has been removed; pt does not recall when this occurred;  .5X.5cm; with yellow slough which is loose and removed easily with scissors, revealing 90% red moist woundbed, small amt bloody drainage, no odor or fluctuance.  Generalized edema and erythremia to toe; x-ray did not indicate osteomyelitis but sometimes an MRI is more definitive; please consider ortho consult or ordering MRI. Dressing procedure/placement/frequency: Bactroban to promote moist healing, foam dressings to protect from further injury.  Discussed plan of care with primary team via phone call. Please re-consult if further assistance is needed.  Thank-you,  Julien Girt MSN, [place], [place], [place], [place]

## 2018-05-02 NOTE — ED Notes (Signed)
LAC results of 1.79 hand delivered to Dr. Stark Jock

## 2018-05-02 NOTE — Progress Notes (Signed)
CSW consult- Access Meds at Discharge.   CSW informed case Freight forwarder. Case Manager following.    Kathrin Greathouse, Marlinda Mike, MSW Clinical Social Worker  [phone] 05/02/2018  10:53 AM

## 2018-05-02 NOTE — Progress Notes (Signed)
Inpatient Diabetes Program Recommendations  AACE/ADA: New Consensus Statement on Inpatient Glycemic Control (2015)  Target Ranges:  Prepandial:   less than 140 mg/dL      Peak postprandial:   less than 180 mg/dL (1-2 hours)      Critically ill patients:  140 - 180 mg/dL   Lab Results  Component Value Date   GLUCAP 135 (H) 05/02/2018   HGBA1C 7.5 (H) 05/02/2018    Review of Glycemic Control  Diabetes history: DM 2 Outpatient Diabetes medications: No medications listed  Current orders for Inpatient glycemic control: Novolog 0-15 units tid, Novolog 0-5 units qhs  Inpatient Diabetes Program Recommendations:    A1c resulted at 7.5% this admission. Due to infection and glucose on admission, patient would benefit from Metformin at time of d/c. Current glucose 135 will follow trends while here.  Thanks,  Tama Headings RN, MSN, BC-ADM Inpatient Diabetes Coordinator Team Pager [phone] (8a-5p)

## 2018-05-02 NOTE — Progress Notes (Signed)
PROGRESS NOTE    [name] [name]   SEG:[id number]   DOB: [birth year]  DOA: 05/01/2018 PCP: Lanice Shirts, MD   Brief Narrative:  [name] [name] is a 72 y.o. female with medical history significant of DM2, HTN, psoriasis with arthritis, lymphocytic colitis, charcot's feet, OSA.  Patient is on chronic budesonide, MTX, and Cimzia for autoimmune issues.  Patient recently had Fx of R foot and has cast on R foot.  Unfortunately she thinks she has been rubbing feet together in her sleep and has abraded skin on the top of L foot (R foot seems fine in cast).  L foot appears to have become infected.     Subjective: Has some mild pain in the left foot especially while she walks.    Assessment & Plan:   Principal Problem:   Wounds with Cellulitis- 2nd toe of left foot and dorsum of left foot   H/o Charcot's feet bilaterally with fracture of right foot s/p casting - she is not sure how or when the nail of the toe came off - blood cultures negative - started on CTX, Flagyl & Vancomycin- continue- will d/w Dr Doran Durand whether any further treatment is warranted wound care consulted  Active Problems: Lactic acidosis - has improved today     Essential hypertension - Lisinopril/ HCTZ    DM2 (diabetes mellitus, type 2) - SSI ordered      Psoriasis with arthritis, lymphocytic colitis - on Budesonide, Methotrexate and DMARD> Cimzia,    DVT prophylaxis: Lovenox Code Status: Full  Family Communication:  Disposition Plan: home in 1-2 days Consultants:    spoke with ortho, Dr Doran Durand Procedures:    none Antimicrobials:  Anti-infectives (From admission, onward)   Start     Dose/Rate Route Frequency Ordered Stop   05/02/18 2200  cefTRIAXone (ROCEPHIN) 2 g in sodium chloride 0.9 % 100 mL IVPB     2 g 200 mL/hr over 30 Minutes Intravenous Every 24 hours 05/02/18 0311     05/02/18 0800  vancomycin (VANCOCIN) IVPB 1000 mg/200 mL premix     1,000 mg 200 mL/hr over 60 Minutes  Intravenous Every 24 hours 05/02/18 0334     05/02/18 0600  metroNIDAZOLE (FLAGYL) tablet 500 mg     500 mg Oral Every 8 hours 05/02/18 0311     05/01/18 2215  cefTRIAXone (ROCEPHIN) 1 g in sodium chloride 0.9 % 100 mL IVPB     1 g 200 mL/hr over 30 Minutes Intravenous  Once 05/01/18 2200 05/01/18 2335   05/01/18 2215  vancomycin (VANCOCIN) IVPB 1000 mg/200 mL premix     1,000 mg 200 mL/hr over 60 Minutes Intravenous  Once 05/01/18 2200 05/02/18 0040       Objective: Vitals:   05/01/18 2000 05/01/18 2340 05/02/18 0215 05/02/18 0255  BP: (!) 141/68 (!) 126/50 (!) 132/49 (!) 161/71  Pulse: 87 83 78 90  Resp: 18 19 18 16   Temp: 98.5 F (36.9 C) 98.7 F (37.1 C)  98.2 F (36.8 C)  TempSrc: Oral Oral  Oral  SpO2: 100% 99% 99% 100%  Weight: 99.8 kg     Height: 5' 6.5" (1.689 m)       Intake/Output Summary (Last 24 hours) at 05/02/2018 0928 Last data filed at 05/02/2018 0600 Gross per 24 hour  Intake 1600 ml  Output 200 ml  Net 1400 ml   Filed Weights   05/01/18 2000  Weight: 99.8 kg    Examination: General exam: Appears comfortable  HEENT: PERRLA, oral mucosa moist, no sclera icterus or thrush Respiratory system: Clear to auscultation. Respiratory effort normal. Cardiovascular system: S1 & S2 heard, RRR.   Gastrointestinal system: Abdomen soft, non-tender, nondistended. Normal bowel sound. No organomegaly Central nervous system: Alert and oriented. No focal neurological deficits. Extremities: No cyanosis, clubbing or edema Skin: erythema and edema in left 2nd toe and dorsum of foot has improved but not resolved. Mild tenderness noted.  Psychiatry:  Mood & affect appropriate.         Data Reviewed: I have personally reviewed following labs and imaging studies  CBC: Recent Labs  Lab 05/01/18 2106 05/02/18 0422  WBC 10.9* 10.3  NEUTROABS 7.3  --   HGB 12.6 11.8*  HCT 38.0 35.7*  MCV 94.3 94.4  PLT 242 621   Basic Metabolic Panel: Recent Labs  Lab  05/01/18 2106 05/02/18 0422  NA 135  --   K 3.6  --   CL 96*  --   CO2 28  --   GLUCOSE 236*  --   BUN 23  --   CREATININE 0.95 0.72  CALCIUM 9.3  --    GFR: Estimated Creatinine Clearance: 76.5 mL/min (by C-G formula based on SCr of 0.72 mg/dL). Liver Function Tests: Recent Labs  Lab 05/01/18 2106  AST 26  ALT 30  ALKPHOS 64  BILITOT 0.3  PROT 6.5  ALBUMIN 3.1*   No results for input(s): LIPASE, AMYLASE in the last 168 hours. No results for input(s): AMMONIA in the last 168 hours. Coagulation Profile: No results for input(s): INR, PROTIME in the last 168 hours. Cardiac Enzymes: No results for input(s): CKTOTAL, CKMB, CKMBINDEX, TROPONINI in the last 168 hours. BNP (last 3 results) No results for input(s): PROBNP in the last 8760 hours. HbA1C: Recent Labs    05/02/18 0422  HGBA1C 7.5*   CBG: Recent Labs  Lab 05/01/18 2101 05/02/18 0809  GLUCAP 239* 135*   Lipid Profile: No results for input(s): CHOL, HDL, LDLCALC, TRIG, CHOLHDL, LDLDIRECT in the last 72 hours. Thyroid Function Tests: No results for input(s): TSH, T4TOTAL, FREET4, T3FREE, THYROIDAB in the last 72 hours. Anemia Panel: No results for input(s): VITAMINB12, FOLATE, FERRITIN, TIBC, IRON, RETICCTPCT in the last 72 hours. Urine analysis:    Component Value Date/Time   COLORURINE YELLOW 09/06/2012 1046   APPEARANCEUR CLOUDY (A) 09/06/2012 1046   LABSPEC 1.011 09/06/2012 1046   PHURINE 7.5 09/06/2012 1046   GLUCOSEU NEGATIVE 09/06/2012 1046   HGBUR NEGATIVE 09/06/2012 1046   [place] 09/06/2012 1046   KETONESUR NEGATIVE 09/06/2012 1046   PROTEINUR NEGATIVE 09/06/2012 1046   UROBILINOGEN 1.0 09/06/2012 1046   NITRITE NEGATIVE 09/06/2012 1046   LEUKOCYTESUR MODERATE (A) 09/06/2012 1046   Sepsis Labs: @LABRCNTIP (procalcitonin:4,lacticidven:4) )No results found for this or any previous visit (from the past 240 hour(s)).       Radiology Studies: Dg Foot Complete Left  Result  Date: 05/01/2018 CLINICAL DATA:  Wound to left foot EXAM: LEFT FOOT - COMPLETE 3+ VIEW COMPARISON:  01/17/2009 FINDINGS: Degenerative changes in the midfoot and hindfoot. No acute bony abnormality. Specifically, no fracture, subluxation, or dislocation. No radiographic changes of osteomyelitis. IMPRESSION: No acute bony abnormality. Electronically Signed   By: Rolm Baptise M.D.   On: 05/01/2018 22:54      Scheduled Meds: . allopurinol  100 mg Oral BID  . [START ON 05/03/2018] aspirin EC  81 mg Oral QODAY  . budesonide  9 mg Oral Daily  . cholecalciferol  1,000  Units Oral BID  . citalopram  20 mg Oral Daily  . cyclobenzaprine  10 mg Oral QHS  . enoxaparin (LOVENOX) injection  40 mg Subcutaneous Q24H  . folic acid  1 mg Oral Daily  . lisinopril  20 mg Oral Daily   And  . hydrochlorothiazide  12.5 mg Oral Daily  . insulin aspart  0-15 Units Subcutaneous TID WC  . insulin aspart  0-5 Units Subcutaneous QHS  . metroNIDAZOLE  500 mg Oral Q8H  . pantoprazole  80 mg Oral Daily   Continuous Infusions: . cefTRIAXone (ROCEPHIN)  IV    . vancomycin 1,000 mg (05/02/18 0812)     LOS: 0 days    Time spent in minutes: 35    Debbe Odea, MD [facility] Pager: [email or web address] Password [facility] 05/02/2018, 9:28 AM

## 2018-05-03 LAB — GLUCOSE, CAPILLARY: Glucose-Capillary: 145 mg/dL — ABNORMAL HIGH (ref 70–99)

## 2018-05-03 MED ORDER — DOXYCYCLINE MONOHYDRATE 100 MG PO TABS: 100.0000 mg | ORAL_TABLET | Freq: Two times a day (BID) | ORAL | 0 refills | Status: DC

## 2018-05-03 MED ORDER — AMOXICILLIN-POT CLAVULANATE 875-125 MG PO TABS: 1.0000 | ORAL_TABLET | Freq: Two times a day (BID) | ORAL | 0 refills | Status: AC

## 2018-05-03 MED ORDER — PREMIER PROTEIN SHAKE: 11.0000 [oz_av] | Freq: Two times a day (BID) | ORAL | 0 refills | Status: DC

## 2018-05-03 MED ORDER — SACCHAROMYCES BOULARDII 250 MG PO CAPS: 250.0000 mg | ORAL_CAPSULE | Freq: Two times a day (BID) | ORAL | Status: DC

## 2018-05-03 NOTE — Care Management Note (Signed)
Case Management Note  Patient Details  Name: [name] [name] MRN: [id number] Date of Birth: July 25, 1946  Subjective/Objective:     Patient needs wound care and assistance with bath, past history with [facility] and wants to use them again.                Action/Plan: Contacted [facility] for the referral, they have accepted. Contacted [facility] to deliver 3n1 to room.   Expected Discharge Date:  05/03/18               Expected Discharge Plan:  [place]  In-House Referral:  Clinical Social Work  Discharge planning Services  CM Consult  Post Acute Care Choice:  Durable Medical Equipment, Home Health Choice offered to:  Patient  DME Arranged:  3-N-1 DME Agency:  [facility]:  RN, Nurse's Aide [place] Agency:  [facility] (formerly [employer])  Status of Service:  Completed, signed off  If discussed at [employer], dates discussed:    Additional Comments:  Guadalupe Maple, RN 05/03/2018, 10:12 AM

## 2018-05-03 NOTE — Discharge Summary (Signed)
Physician Discharge Summary  [name] [name] TKW:[id number] DOB: 1946/01/01 DOA: 05/01/2018  PCP: Lanice Shirts, MD  Admit date: 05/01/2018 Discharge date: 05/03/2018  Admitted From: home  Disposition:  hom   Recommendations for Outpatient Follow-up:  1. F/u with ortho in 1 wk  Home Health:  ordered    Discharge Condition:  stable   CODE STATUS:  Full code  Consultants:    spoke with ortho, Dr Doran Durand    Discharge Diagnoses:  Principal Problem:   Cellulitis of toe of left foot Active Problems:   Essential hypertension   DM2 (diabetes mellitus, type 2) (HCC)   Psoriasis/ psoriatic arthritis  Charcot's foot   Lymphocytic colitis  Brief Summary: arthritis, lymphocytic colitis, charcot's feet, OSA. Patient is on chronic budesonide, MTX, and Cimzia for autoimmune issues.  Patient recently had Fx of R foot and has cast on R foot. Unfortunately she thinks she has been rubbing feet together in her sleep and has abraded skin on the top of L foot (R foot seems fine in cast).  Dorsum of L foot and 2nd toe appear to have become infected.     Hospital Course:  Principal Problem:   Wounds with Cellulitis- 2nd toe of left foot and dorsum of left foot  Avulsion of left 2nd toe nail   H/o Charcot's feet bilaterally with fracture of right foot s/p casting - she is not sure how or when the nail of the toe came off - blood cultures have remained negative - CRP 1.2 - started on CTX, Flagyl & Vancomycin in ED -   d/w Dr Doran Durand whether any further treatment is warranted - he agrees with antibiotics only for now and f/u in his office in 1 wk - wound appear to be improving in regards to edema an erythema and she is stable to be transitioned to oral antibiotics - wound care instructions given to patient - transitioned to Augmentin and Doxycycline for 1 more week- due to immune compromised state, she may take longer that usual to clear infection  Active Problems: Lactic  acidosis - has improved to normal    Essential hypertension - Lisinopril/ HCTZ    DM2 (diabetes mellitus, type 2) - SSI ordered in the hospital - A1c is 7.5      Psoriasis with arthritis, lymphocytic colitis - on Budesonide, Methotrexate and DMARD> Cimzia,    Discharge Exam: Vitals:   05/02/18 2241 05/03/18 0459  BP: (!) 137/55 (!) 145/69  Pulse: 78 77  Resp: 15 17  Temp: 99.8 F (37.7 C) 97.9 F (36.6 C)  SpO2: 97% 97%   Vitals:   05/02/18 0255 05/02/18 1311 05/02/18 2241 05/03/18 0459  BP: (!) 161/71 131/61 (!) 137/55 (!) 145/69  Pulse: 90 95 78 77  Resp: 16 14 15 17   Temp: 98.2 F (36.8 C) 98.4 F (36.9 C) 99.8 F (37.7 C) 97.9 F (36.6 C)  TempSrc: Oral Oral Axillary Axillary  SpO2: 100% 99% 97% 97%  Weight:      Height:        General: Pt is alert, awake, not in acute distress Cardiovascular: RRR, S1/S2 +, no rubs, no gallops Respiratory: CTA bilaterally, no wheezing, no rhonchi Abdominal: Soft, NT, ND, bowel sounds + Extremities: see below       Discharge Instructions  Discharge Instructions    Diet - low sodium heart healthy   Complete by:  As directed    Diet Carb Modified   Complete by:  As directed  Increase activity slowly   Complete by:  As directed      Allergies as of 05/03/2018      Reactions   Other Shortness Of Breath   Blue fish: palms and feet turn red   Bee Venom Swelling   Sulfa Antibiotics Hives      Medication List    TAKE these medications   ALIGN PO Take 1 capsule by mouth daily.   allopurinol 100 MG tablet Commonly known as:  ZYLOPRIM Take 100 mg by mouth 2 (two) times daily.   amoxicillin-clavulanate 875-125 MG tablet Commonly known as:  AUGMENTIN Take 1 tablet by mouth every 12 (twelve) hours for 10 days.   aspirin 81 MG tablet Take 81 mg by mouth every other day.   CIMZIA [place] Inject 400 mg into the skin every 30 (thirty) days. 200 mg on each side of stomach   citalopram 40 MG tablet Commonly  known as:  CELEXA Take 40 mg by mouth daily.   cyclobenzaprine 10 MG tablet Commonly known as:  FLEXERIL Take 10 mg by mouth daily.   doxycycline 100 MG tablet Commonly known as:  ADOXA Take 1 tablet (100 mg total) by mouth 2 (two) times daily.   hydrochlorothiazide 12.5 MG capsule Commonly known as:  MICROZIDE Take 1 capsule (12.5 mg total) by mouth as needed (swelling).   lisinopril-hydrochlorothiazide 20-12.5 MG tablet Commonly known as:  PRINZIDE,ZESTORETIC Take 1 tablet by mouth every morning.   loperamide 2 MG capsule Commonly known as:  IMODIUM Take 6 mg by mouth 2 (two) times daily.   Melatonin 5 MG Subl Place 5 mg under the tongue at bedtime.   methotrexate 2.5 MG tablet Commonly known as:  RHEUMATREX Take 20 mg by mouth once a week. Caution: Chemotherapy. Protect from light. Takes on Friday   mupirocin cream 2 % Commonly known as:  BACTROBAN Apply 1 application topically 2 (two) times daily as needed (psoriasis).   omeprazole 40 MG capsule Commonly known as:  PRILOSEC Take 40 mg by mouth at bedtime.   PRESCRIPTION MEDICATION Place 3 drops into both ears as needed. Fluocinolone 0.01% Ear Drops. For inflammation and itching of the ear.   protein supplement shake Liqd Commonly known as:  PREMIER PROTEIN Take 325 mLs (11 oz total) by mouth 2 (two) times daily between meals.   RHEUMATE Caps Take 1 capsule by mouth daily.   UCERIS 9 MG Tb24 Generic drug:  Budesonide Take 9 mg by mouth daily.   Vitamin B-12 1000 MCG Subl Place 1,000 mcg under the tongue daily.   Vitamin D3 1000 units Caps Take 1,000 Units by mouth 2 (two) times daily.            Durable Medical Equipment  (From admission, onward)         Start     Ordered   05/02/18 1111  For home use only DME 3 n 1  Once     05/02/18 1110         Follow-up Information    [name], Altamese Cabal, MD Follow up.   Specialty:  Internal Medicine Contact information: [address] [place]  [address] [address] [phone]          Allergies  Allergen Reactions  . Other Shortness Of Breath    Blue fish: palms and feet turn red  . Bee Venom Swelling  . Sulfa Antibiotics Hives     Procedures/Studies:    Dg Foot Complete Left  Result Date:  05/01/2018 CLINICAL DATA:  Wound to left foot EXAM: LEFT FOOT - COMPLETE 3+ VIEW COMPARISON:  01/17/2009 FINDINGS: Degenerative changes in the midfoot and hindfoot. No acute bony abnormality. Specifically, no fracture, subluxation, or dislocation. No radiographic changes of osteomyelitis. IMPRESSION: No acute bony abnormality. Electronically Signed   By: Rolm Baptise M.D.   On: 05/01/2018 22:54      The results of significant diagnostics from this hospitalization (including imaging, microbiology, ancillary and laboratory) are listed below for reference.     Microbiology: Recent Results (from the past 240 hour(s))  Culture, blood (routine x 2)     Status: None (Preliminary result)   Collection Time: 05/01/18 10:00 PM  Result Value Ref Range Status   Specimen Description   Final    BLOOD RIGHT ANTECUBITAL Performed at [facility], [place] [address] [place] [address] [place], [place] [address]    Special Requests   Final    BOTTLES DRAWN AEROBIC AND ANAEROBIC Blood Culture adequate volume Performed at [facility], [address] [place], [place] [address]    Culture   Final    NO GROWTH < 24 HOURS Performed at [facility], [place] [name], [place], [place] [address]    Report Status PENDING  Incomplete  Culture, blood (routine x 2)     Status: None (Preliminary result)   Collection Time: 05/01/18 10:40 PM  Result Value Ref Range Status   Specimen Description   Final    BLOOD RIGHT WRIST Performed at [facility], [address] [place], [place] [address]    Special Requests   Final    BOTTLES DRAWN AEROBIC AND ANAEROBIC Blood Culture adequate volume Performed at [facility], [address] [place], [place] [address]    Culture   Final    NO GROWTH < 24 HOURS Performed at [facility], [place] [address], [place], [place] [address]    Report Status PENDING  Incomplete     Labs: BNP (last 3 results) No results for input(s): BNP in the last 8760 hours. Basic Metabolic Panel: Recent Labs  Lab 05/01/18 2106 05/02/18 0422  NA 135  --   K 3.6  --   CL 96*  --   CO2 28  --   GLUCOSE 236*  --   BUN 23  --   CREATININE 0.95 0.72  CALCIUM 9.3  --    Liver Function Tests: Recent Labs  Lab 05/01/18 2106  AST 26  ALT 30  ALKPHOS 64  BILITOT 0.3  PROT 6.5  ALBUMIN 3.1*   No results for input(s): LIPASE, AMYLASE in the last 168 hours. No results for input(s): AMMONIA in the last 168 hours. CBC: Recent Labs  Lab 05/01/18 2106 05/02/18 0422  WBC 10.9* 10.3  NEUTROABS 7.3  --   HGB 12.6 11.8*  HCT 38.0 35.7*  MCV 94.3 94.4  PLT 242 268   Cardiac Enzymes: No results for input(s): CKTOTAL, CKMB, CKMBINDEX, TROPONINI in the last 168 hours. BNP: Invalid input(s): POCBNP CBG: Recent Labs  Lab 05/02/18 0809 05/02/18 1205 05/02/18 1657 05/02/18 2242 05/03/18 0707  GLUCAP 135* 180* 143* 192* 145*   D-Dimer No results for input(s): DDIMER in the last 72 hours. Hgb A1c Recent Labs    05/02/18 0422  HGBA1C 7.5*   Lipid Profile No results for input(s): CHOL, HDL, LDLCALC, TRIG, CHOLHDL, LDLDIRECT in the last 72 hours. Thyroid function studies No results for  input(s): TSH, T4TOTAL, T3FREE, THYROIDAB in the last 72 hours.  Invalid input(s): FREET3 Anemia work up No results for input(s): VITAMINB12, FOLATE, FERRITIN, TIBC, IRON, RETICCTPCT in the last 72 hours. Urinalysis    Component Value Date/Time   COLORURINE YELLOW 05/02/2018 2255   APPEARANCEUR CLEAR 05/02/2018 2255   LABSPEC 1.016 05/02/2018 2255   PHURINE 6.0 05/02/2018 2255   GLUCOSEU 150 (A) 05/02/2018 2255   HGBUR NEGATIVE 05/02/2018 2255   BILIRUBINUR NEGATIVE  05/02/2018 2255   KETONESUR NEGATIVE 05/02/2018 2255   PROTEINUR NEGATIVE 05/02/2018 2255   UROBILINOGEN 1.0 09/06/2012 1046   NITRITE NEGATIVE 05/02/2018 2255   LEUKOCYTESUR NEGATIVE 05/02/2018 2255   Sepsis Labs Invalid input(s): PROCALCITONIN,  WBC,  LACTICIDVEN Microbiology Recent Results (from the past 240 hour(s))  Culture, blood (routine x 2)     Status: None (Preliminary result)   Collection Time: 05/01/18 10:00 PM  Result Value Ref Range Status   Specimen Description   Final    BLOOD RIGHT ANTECUBITAL Performed at [facility], [place] [address] [place], [place] [address]    Special Requests   Final    BOTTLES DRAWN AEROBIC AND ANAEROBIC Blood Culture adequate volume Performed at [facility], [place] [place], [place] [address]    Culture   Final    NO GROWTH < 24 HOURS Performed at [facility], [place] [address], [place], [place] [address]    Report Status PENDING  Incomplete  Culture, blood (routine x 2)     Status: None (Preliminary result)   Collection Time: 05/01/18 10:40 PM  Result Value Ref Range Status   Specimen Description   Final    BLOOD RIGHT WRIST Performed at [facility], [place] [place], [place] [address]    Special Requests   Final    BOTTLES DRAWN AEROBIC AND ANAEROBIC Blood Culture adequate volume Performed at [facility], [place] [place], [place] [address]    Culture   Final    NO GROWTH < 24 HOURS Performed at [facility], [place] [address] [place], [place] [address]    Report Status PENDING  Incomplete     Time coordinating discharge in minutes: 12  SIGNED:   Debbe Odea, MD  [facility] 05/03/2018, 10:08 AM Pager   If 7PM-7AM, please contact night-coverage [email or web address] Password [email or web address]

## 2018-05-03 NOTE — Plan of Care (Signed)
Patient discharged home. She requested to take her morning meds once she got home. Dressings changed to left foot. IV dc'd. Discharge instructions given to patient and her husband, both verbalized understanding.

## 2018-05-03 NOTE — Plan of Care (Signed)
Pt is stable. Plan of care reviewed. 

## 2018-05-05 DIAGNOSIS — I4510 Unspecified right bundle-branch block: Secondary | ICD-10-CM | POA: Diagnosis not present

## 2018-05-05 DIAGNOSIS — Z6835 Body mass index (BMI) 35.0-35.9, adult: Secondary | ICD-10-CM | POA: Diagnosis not present

## 2018-05-05 DIAGNOSIS — E669 Obesity, unspecified: Secondary | ICD-10-CM | POA: Diagnosis not present

## 2018-05-05 DIAGNOSIS — L03116 Cellulitis of left lower limb: Secondary | ICD-10-CM | POA: Diagnosis not present

## 2018-05-05 DIAGNOSIS — Z9181 History of falling: Secondary | ICD-10-CM | POA: Diagnosis not present

## 2018-05-05 DIAGNOSIS — M84474D Pathological fracture, right foot, subsequent encounter for fracture with routine healing: Secondary | ICD-10-CM | POA: Diagnosis not present

## 2018-05-05 DIAGNOSIS — I10 Essential (primary) hypertension: Secondary | ICD-10-CM | POA: Diagnosis not present

## 2018-05-05 DIAGNOSIS — S91302A Unspecified open wound, left foot, initial encounter: Secondary | ICD-10-CM | POA: Diagnosis not present

## 2018-05-05 DIAGNOSIS — Z86718 Personal history of other venous thrombosis and embolism: Secondary | ICD-10-CM | POA: Diagnosis not present

## 2018-05-05 DIAGNOSIS — S91105A Unspecified open wound of left lesser toe(s) without damage to nail, initial encounter: Secondary | ICD-10-CM | POA: Diagnosis not present

## 2018-05-05 DIAGNOSIS — Z96641 Presence of right artificial hip joint: Secondary | ICD-10-CM | POA: Diagnosis not present

## 2018-05-05 DIAGNOSIS — G4733 Obstructive sleep apnea (adult) (pediatric): Secondary | ICD-10-CM | POA: Diagnosis not present

## 2018-05-05 DIAGNOSIS — L409 Psoriasis, unspecified: Secondary | ICD-10-CM | POA: Diagnosis not present

## 2018-05-05 DIAGNOSIS — M1991 Primary osteoarthritis, unspecified site: Secondary | ICD-10-CM | POA: Diagnosis not present

## 2018-05-05 DIAGNOSIS — E1142 Type 2 diabetes mellitus with diabetic polyneuropathy: Secondary | ICD-10-CM | POA: Diagnosis not present

## 2018-05-05 DIAGNOSIS — Z79899 Other long term (current) drug therapy: Secondary | ICD-10-CM | POA: Diagnosis not present

## 2018-05-07 LAB — CULTURE, BLOOD (ROUTINE X 2)
Culture: NO GROWTH
Culture: NO GROWTH
Special Requests: ADEQUATE
Special Requests: ADEQUATE

## 2018-05-09 DIAGNOSIS — S91302A Unspecified open wound, left foot, initial encounter: Secondary | ICD-10-CM | POA: Diagnosis not present

## 2018-05-09 DIAGNOSIS — I10 Essential (primary) hypertension: Secondary | ICD-10-CM | POA: Diagnosis not present

## 2018-05-09 DIAGNOSIS — S91105A Unspecified open wound of left lesser toe(s) without damage to nail, initial encounter: Secondary | ICD-10-CM | POA: Diagnosis not present

## 2018-05-09 DIAGNOSIS — L03116 Cellulitis of left lower limb: Secondary | ICD-10-CM | POA: Diagnosis not present

## 2018-05-09 DIAGNOSIS — E1142 Type 2 diabetes mellitus with diabetic polyneuropathy: Secondary | ICD-10-CM | POA: Diagnosis not present

## 2018-05-09 DIAGNOSIS — M84474D Pathological fracture, right foot, subsequent encounter for fracture with routine healing: Secondary | ICD-10-CM | POA: Diagnosis not present

## 2018-05-11 DIAGNOSIS — M1711 Unilateral primary osteoarthritis, right knee: Secondary | ICD-10-CM | POA: Diagnosis not present

## 2018-05-12 DIAGNOSIS — L03116 Cellulitis of left lower limb: Secondary | ICD-10-CM | POA: Diagnosis not present

## 2018-05-12 DIAGNOSIS — M84474D Pathological fracture, right foot, subsequent encounter for fracture with routine healing: Secondary | ICD-10-CM | POA: Diagnosis not present

## 2018-05-12 DIAGNOSIS — S91105A Unspecified open wound of left lesser toe(s) without damage to nail, initial encounter: Secondary | ICD-10-CM | POA: Diagnosis not present

## 2018-05-12 DIAGNOSIS — S91302A Unspecified open wound, left foot, initial encounter: Secondary | ICD-10-CM | POA: Diagnosis not present

## 2018-05-12 DIAGNOSIS — I10 Essential (primary) hypertension: Secondary | ICD-10-CM | POA: Diagnosis not present

## 2018-05-12 DIAGNOSIS — E1142 Type 2 diabetes mellitus with diabetic polyneuropathy: Secondary | ICD-10-CM | POA: Diagnosis not present

## 2018-05-13 DIAGNOSIS — S92353A Displaced fracture of fifth metatarsal bone, unspecified foot, initial encounter for closed fracture: Secondary | ICD-10-CM

## 2018-05-13 DIAGNOSIS — M79672 Pain in left foot: Secondary | ICD-10-CM | POA: Diagnosis not present

## 2018-05-13 DIAGNOSIS — S92354D Nondisplaced fracture of fifth metatarsal bone, right foot, subsequent encounter for fracture with routine healing: Secondary | ICD-10-CM | POA: Diagnosis not present

## 2018-05-13 DIAGNOSIS — E11621 Type 2 diabetes mellitus with foot ulcer: Secondary | ICD-10-CM | POA: Diagnosis not present

## 2018-05-13 DIAGNOSIS — M79671 Pain in right foot: Secondary | ICD-10-CM | POA: Diagnosis not present

## 2018-05-13 HISTORY — DX: Displaced fracture of fifth metatarsal bone, unspecified foot, initial encounter for closed fracture: S92.353A

## 2018-05-16 DIAGNOSIS — S91105A Unspecified open wound of left lesser toe(s) without damage to nail, initial encounter: Secondary | ICD-10-CM | POA: Diagnosis not present

## 2018-05-16 DIAGNOSIS — Z23 Encounter for immunization: Secondary | ICD-10-CM | POA: Diagnosis not present

## 2018-05-16 DIAGNOSIS — L4059 Other psoriatic arthropathy: Secondary | ICD-10-CM | POA: Diagnosis not present

## 2018-05-16 DIAGNOSIS — E1142 Type 2 diabetes mellitus with diabetic polyneuropathy: Secondary | ICD-10-CM | POA: Diagnosis not present

## 2018-05-16 DIAGNOSIS — M84474D Pathological fracture, right foot, subsequent encounter for fracture with routine healing: Secondary | ICD-10-CM | POA: Diagnosis not present

## 2018-05-16 DIAGNOSIS — I10 Essential (primary) hypertension: Secondary | ICD-10-CM | POA: Diagnosis not present

## 2018-05-16 DIAGNOSIS — S91302A Unspecified open wound, left foot, initial encounter: Secondary | ICD-10-CM | POA: Diagnosis not present

## 2018-05-16 DIAGNOSIS — L03116 Cellulitis of left lower limb: Secondary | ICD-10-CM | POA: Diagnosis not present

## 2018-05-18 DIAGNOSIS — M25569 Pain in unspecified knee: Secondary | ICD-10-CM | POA: Diagnosis not present

## 2018-05-18 DIAGNOSIS — M1711 Unilateral primary osteoarthritis, right knee: Secondary | ICD-10-CM | POA: Diagnosis not present

## 2018-05-20 ENCOUNTER — Ambulatory Visit: Payer: Medicare Other | Admitting: Podiatry

## 2018-05-20 DIAGNOSIS — L03116 Cellulitis of left lower limb: Secondary | ICD-10-CM | POA: Diagnosis not present

## 2018-05-20 DIAGNOSIS — M84474D Pathological fracture, right foot, subsequent encounter for fracture with routine healing: Secondary | ICD-10-CM | POA: Diagnosis not present

## 2018-05-20 DIAGNOSIS — S91302A Unspecified open wound, left foot, initial encounter: Secondary | ICD-10-CM | POA: Diagnosis not present

## 2018-05-20 DIAGNOSIS — E1142 Type 2 diabetes mellitus with diabetic polyneuropathy: Secondary | ICD-10-CM | POA: Diagnosis not present

## 2018-05-20 DIAGNOSIS — I10 Essential (primary) hypertension: Secondary | ICD-10-CM | POA: Diagnosis not present

## 2018-05-20 DIAGNOSIS — S91105A Unspecified open wound of left lesser toe(s) without damage to nail, initial encounter: Secondary | ICD-10-CM | POA: Diagnosis not present

## 2018-05-25 DIAGNOSIS — M1711 Unilateral primary osteoarthritis, right knee: Secondary | ICD-10-CM | POA: Diagnosis not present

## 2018-06-10 DIAGNOSIS — S92354D Nondisplaced fracture of fifth metatarsal bone, right foot, subsequent encounter for fracture with routine healing: Secondary | ICD-10-CM | POA: Diagnosis not present

## 2018-06-20 DIAGNOSIS — L4059 Other psoriatic arthropathy: Secondary | ICD-10-CM | POA: Diagnosis not present

## 2018-06-24 ENCOUNTER — Ambulatory Visit: Payer: Medicare Other | Admitting: Podiatry

## 2018-06-27 ENCOUNTER — Ambulatory Visit: Payer: Medicare Other | Admitting: Podiatry

## 2018-07-05 DIAGNOSIS — K5289 Other specified noninfective gastroenteritis and colitis: Secondary | ICD-10-CM | POA: Diagnosis not present

## 2018-07-05 DIAGNOSIS — Z6841 Body Mass Index (BMI) 40.0 and over, adult: Secondary | ICD-10-CM | POA: Diagnosis not present

## 2018-07-05 DIAGNOSIS — M1009 Idiopathic gout, multiple sites: Secondary | ICD-10-CM | POA: Diagnosis not present

## 2018-07-05 DIAGNOSIS — L4059 Other psoriatic arthropathy: Secondary | ICD-10-CM | POA: Diagnosis not present

## 2018-07-05 DIAGNOSIS — Z79899 Other long term (current) drug therapy: Secondary | ICD-10-CM | POA: Diagnosis not present

## 2018-07-05 DIAGNOSIS — M2550 Pain in unspecified joint: Secondary | ICD-10-CM | POA: Diagnosis not present

## 2018-07-05 DIAGNOSIS — L408 Other psoriasis: Secondary | ICD-10-CM | POA: Diagnosis not present

## 2018-07-11 ENCOUNTER — Encounter: Payer: Self-pay | Admitting: Cardiovascular Disease

## 2018-07-11 ENCOUNTER — Telehealth: Payer: Self-pay | Admitting: *Deleted

## 2018-07-11 ENCOUNTER — Ambulatory Visit (INDEPENDENT_AMBULATORY_CARE_PROVIDER_SITE_OTHER): Payer: Medicare Other | Admitting: Cardiovascular Disease

## 2018-07-11 VITALS — BP 132/64 | HR 78 | Ht 66.0 in | Wt 262.6 lb

## 2018-07-11 DIAGNOSIS — M25473 Effusion, unspecified ankle: Secondary | ICD-10-CM | POA: Diagnosis not present

## 2018-07-11 DIAGNOSIS — I10 Essential (primary) hypertension: Secondary | ICD-10-CM

## 2018-07-11 DIAGNOSIS — G4733 Obstructive sleep apnea (adult) (pediatric): Secondary | ICD-10-CM

## 2018-07-11 DIAGNOSIS — L4050 Arthropathic psoriasis, unspecified: Secondary | ICD-10-CM

## 2018-07-11 DIAGNOSIS — E118 Type 2 diabetes mellitus with unspecified complications: Secondary | ICD-10-CM

## 2018-07-11 MED ORDER — FUROSEMIDE 20 MG PO TABS: 20.0000 mg | ORAL_TABLET | ORAL | 3 refills | Status: DC | PRN

## 2018-07-11 NOTE — Progress Notes (Signed)
Patient ID: [name] [name], female   DOB: 1946/04/17, 72 y.o.   MRN: [id number]     HPI: AMARRAH [name] is a 72 y.o. female percents in the office today for an 8 month followup sleep/cardiology evaluation.  [name] is a retired [employer]  who has a history of hypertension, obesity, psoriatic arthritis, DVT, which occurred while traveling to [place], as well as complex obstructive sleep apnea.  [name] [name] has had difficulty in the past with CPAP therapy. [name] [name] has been able to tolerate BiPAP and has had a Respironics BiPAP Auto unit well but also has had difficulty with some of her high pressure requirements. In April 2014 I changed her maximum BiPAP pressure to 19 and her maximum EPAP pressure to 15. I also reduced her minimum EPAP pressure to 6 and her minimal IPAP pressure to 10 with pressure support of 4 and changed her Bi_Flex to 3. [name] [name] has been followed by [facility].  Clinically, [name] [name] had felt markedly improved with BiPAP.  Compared to prior CPAP therapy.  [name] [name] has psoriatic arthritis and is on Humira as well as methotrexate followed by rheumatology.  [name] [name] also has a Charcot joint in her right foot.   [name] [name] is on Cymbalta for pain relief relative to this. [name] [name] does not routinely exercise.  [name] [name] keeps busy moving around, but typically does not do aerobic activity.  [name] [name] has not been successful with significant weight loss.   [name] [name] recently, [name] [name] has been taking lisinopril HCTZ 20/12.5 mg for hypertension.  [name] [name] had experienced an episode of chest discomfort which [name] [name] felt was like a cramp and [name] [name] noticed this most when standing up, but was associated with diaphoresis and shortness of breath.  [name] [name] does have family history for coronary artery disease both in her mother as well as in her maternal grandmother. [name] [name] has not been able to be very active due to her Charcot foot.  [name] [name] also had noticed some mild shortness of breath with activity.  [name] [name] eventually was referred for a nuclear perfusion study on 01/31/2015 which  revealed normal perfusion and function; ejection fraction 64%.  When I last saw her in February 2017, her BiPAP machine had begun to fail.  [name] [name] continues to use CPAP with 100% compliance and cannot sleep without it.  [name] [name] typically sleeps for at least 8 hours per night.  [name] [name] had taken her BiPAP machine to [facility] who stated that her machine essentially had stopped working and is in need for new machine.   [name] [name] received a new Respironics Dream station auto BiPAP unit in April 2017.  [name] [name] uses a Customer service manager FX full face mask, medium size.  [name] [name] feels that the machine has been working well but [name] [name] has had difficulty with her sleep.  At times [name] [name] feels that [name] [name] is not getting enough pressure.  Previously [name] [name] had been set on a BiPAP auto unit with an EPAP minimum of 8 and IPAP max of 19 , which had worked well.  I obtained a new download today from 02/16/2016 through 03/16/2016.  [name] [name] is 100% compliant and is averaging 8 hours and 25 minutes of sleep.  Apparently, [name] [name] is not been set on auto and has been on and IPAP of 14 and an EPAP of 10.  Her AHI is increased at 25.1. I changed her BiPAP mode from a fixed pressure to auto BiPAP initiating atenolol over 6 with potential titration up to 20/16.  I also allowed for pressure support to change from 4-6 as needed.  IN 2018, [name] [name] felt well from a cardiac perspective, but has had significant infections including MRSA, strep throat, UTI, and [name] [name] had fractured her right ankle.  As result, [name] [name] has noticed some ankle swelling right greater than left.  [name] [name] continues to use her BiPAP and believes [name] [name] is feeling much better than [name] [name] had in the past.  [name] is her DME company.  A download in the office from 01/26/2017 through 04/25/2017.  [name] [name] is 100% compliance.  [name] [name] is averaging 8 hours and 36 minutes of sleep per night.  Her average device IPAP pressure was 18.2 and average device CPAP pressure greater than 90% of the time was 14.1.  AHI continued to be elevated at 21.2. An Epworth  scale score was calculated in the office today and this endorsed at 4, arguing against daytime sleepiness.  I last saw her, [name] [name] was sleeping adequate duration.  Her AHI remains elevated I suggested that [name] [name] reduce ramp time from 30 minutes down to 20 minutes and increased her starting pressure from 4 up to 6 cm.  I last saw her in September 2019 at which time [name] [name] had again started to notice  symptoms where [name] [name] was waking up at night.  [name] [name] noted leaks from her mask at higher pressures.  [name] [name] feels that [name] [name] is more  tired.  [name] [name] had some swelling right lower extremity greater than left and [name] [name] has a Charcot right foot.  [name] [name] had broken her right ankle in August 2018.  [name] [name] denied any chest pain.  [name] [name] is unaware of any cardiac arrhythmias.  Her daughter, [name] is a GI physician who had been practicing in [facility] and will be moving up to work with [name].  [name] [name] is grateful that [name] [name] will now have her twin granddaughters close by.  [name] [name] was recently placed on Cimzia for her psoriatic arthritis to take in addition to her methotrexate.  [name] [name] is followed by [name] for her rheumatologic issues.   When I saw her, sleeping 8 hours 29 minutes and her average IPAP pressure greater than 90% 16.7 with an average EPAP pressure of 12.4.  I reduced her ramp time down to 20 minutes.  Having issues with her mask and I recommended a trial of the new DreamWear full facemask by either a Respironics or a ResMed.  Over the past several months, [name] [name] has not been able to be active due to her fracture of her fifth metatarsal.  [name] [name] was in a hard cast for 2 months with no weightbearing.  As result [name] [name] has not been able to exercise.  [name] [name] has gained weight.  Was just removed several weeks ago.  [name] [name] admits to ankle swelling.  [name] [name] continues to use her BiPAP.  A new download was obtained and [name] [name] again is compliant.  Her average device IPAP pressure greater than 90% of the time was 18.1 with a maximum  titrated IPAP pressure of 20.  AHI remained elevated at 17.9.  [name] [name] presents for evaluation   Past Medical History:  Diagnosis Date  . Arthritis   . Deep vein thrombosis ([place])    right calf - 05/2012   . Diabetes mellitus without complication (HCC)    diet controlled   . GERD (gastroesophageal reflux disease)   . Hyperlipidemia   . Hypertension    Ejection fraction =>55% Left ventricular systolic function is normal. Left ventricular wall motion is normal    . Lymphocytic colitis   . Neuropathy  diabetic - in bilateral feet   . Pityriasis lichenoides chronica   . Sleep apnea    bipap    Past Surgical History:  Procedure Laterality Date  . DILATION AND CURETTAGE OF UTERUS    . HAMMER TOE SURGERY    . right hand surgery      due to blood infection   . torn meniscus repair      right knee   . TOTAL HIP ARTHROPLASTY  09/13/2012   Procedure: TOTAL HIP ARTHROPLASTY ANTERIOR APPROACH;  Surgeon: Mauri Pole, MD;  Location: [facility];  Service: Orthopedics;  Laterality: Right;    Allergies  Allergen Reactions  . Other Shortness Of Breath    Blue fish: palms and feet turn red  . Bee Venom Swelling  . Sulfa Antibiotics Hives    Current Outpatient Medications  Medication Sig Dispense Refill  . allopurinol (ZYLOPRIM) 100 MG tablet Take 100 mg by mouth 2 (two) times daily.    Marland Kitchen aspirin 81 MG tablet Take 81 mg by mouth every other day.     . Budesonide (UCERIS) 9 MG TB24 Take 9 mg by mouth daily.     . Certolizumab Pegol (CIMZIA [place]) Inject 400 mg into the skin every 30 (thirty) days. 200 mg on each side of stomach    . Cholecalciferol (VITAMIN D3) 1000 UNITS CAPS Take 1,000 Units by mouth 2 (two) times daily.     . citalopram (CELEXA) 40 MG tablet Take 40 mg by mouth daily.    . Cyanocobalamin (VITAMIN B-12) 1000 MCG SUBL Place 1,000 mcg under the tongue daily.    . cyclobenzaprine (FLEXERIL) 10 MG tablet Take 10 mg by mouth daily.     . Dietary Management Product (RHEUMATE) CAPS  Take 1 capsule by mouth daily.  0  . hydrochlorothiazide (MICROZIDE) 12.5 MG capsule Take 1 capsule (12.5 mg total) by mouth as needed (swelling). 30 capsule 3  . lisinopril-hydrochlorothiazide (PRINZIDE,ZESTORETIC) 20-12.5 MG per tablet Take 1 tablet by mouth every morning.    . loperamide (IMODIUM) 2 MG capsule Take 6 mg by mouth 2 (two) times daily.    . Melatonin 5 MG SUBL Place 5 mg under the tongue at bedtime.    . metFORMIN (GLUCOPHAGE) 500 MG tablet Take 500 mg by mouth 2 (two) times daily with a meal.    . methotrexate (RHEUMATREX) 2.5 MG tablet Take 20 mg by mouth once a week. Caution: Chemotherapy. Protect from light. Takes on Friday    . mupirocin cream (BACTROBAN) 2 % Apply 1 application topically 2 (two) times daily as needed (psoriasis).     Marland Kitchen omeprazole (PRILOSEC) 40 MG capsule Take 40 mg by mouth at bedtime.    Marland Kitchen PRESCRIPTION MEDICATION Place 3 drops into both ears as needed. Fluocinolone 0.01% Ear Drops. For inflammation and itching of the ear.    . Probiotic Product (ALIGN PO) Take 1 capsule by mouth daily.    . protein supplement shake (PREMIER PROTEIN) LIQD Take 325 mLs (11 oz total) by mouth 2 (two) times daily between meals. 60 Can 0  . furosemide (LASIX) 20 MG tablet Take 1 tablet (20 mg total) by mouth as needed (swelling). 30 tablet 3   No current facility-administered medications for this visit.     Socially [name] [name] is widowed. [name] [name] has 2 children and 2 grandchildren. Her daughter is a Engineer, drilling. There is no tobacco use. [name] [name] does drink occasional alcohol.  ROS General: Negative; No fevers, chills, or night sweats;  HEENT:  Negative; No changes in vision or hearing, sinus congestion, difficulty swallowing Pulmonary: Negative; No cough, wheezing, shortness of breath, hemoptysis Cardiovascular: Negative; No chest pain, presyncope, syncope, palpitations GI: History of lymphocytic colitis GU: Negative; No dysuria, hematuria, or difficulty voiding Musculoskeletal: Positive  for R foot charcot joint; recent right ankle fracture Hematologic/Oncology: Negative; no easy bruising, bleeding Rheumatologic: Psoriatic arthritis;  History of gout Endocrine: Negative; no heat/cold intolerance; no diabetes Neuro: Negative; no changes in balance, headaches Skin: History of psoriatic arthritis Psychiatric: Negative; No behavioral problems, depression Sleep: Positive for complex sleep apnea on BiPAP therapy;  No residual snoring, daytime sleepiness, hypersomnolence, bruxism, restless legs, hypnogognic hallucinations, no cataplexy Other comprehensive 14 point system review is negative.  PE BP 132/64   Pulse 78   Ht '5\' 6"'$  (1.676 m)   Wt 262 lb 9.6 oz (119.1 kg)   BMI 42.38 kg/m    Repeat blood pressure by me was 128/66  Wt Readings from Last 3 Encounters:  07/11/18 262 lb 9.6 oz (119.1 kg)  05/01/18 220 lb (99.8 kg)  01/19/18 240 lb (108.9 kg)   General: Alert, oriented, no distress.  Skin: normal turgor, no rashes, warm and dry HEENT: Normocephalic, atraumatic. Pupils equal round and reactive to light; sclera anicteric; extraocular muscles intact;  Nose without nasal septal hypertrophy Mouth/Parynx benign; Mallinpatti scale3 Neck: No JVD, no carotid bruits; normal carotid upstroke Lungs: clear to ausculatation and percussion; no wheezing or rales Chest wall: without tenderness to palpitation Heart: PMI not displaced, RRR, s1 s2 normal, 1/6 systolic murmur, no diastolic murmur, no rubs, gallops, thrills, or heaves Abdomen: soft, nontender; no hepatosplenomehaly, BS+; abdominal aorta nontender and not dilated by palpation. Back: no CVA tenderness Pulses 2+ Musculoskeletal: full range of motion, normal strength, no joint deformities Extremities: Mild bilateral ankle swelling;no clubbing cyanosis, Homan's sign negative  Neurologic: grossly nonfocal; Cranial nerves grossly wnl Psychologic: Normal mood and affect    ECG (independently read by me): Sinus rhythm at  78 bpm.  Mild sinus arrhythmia.  Right bundle branch block with repolarization changes.  Shanon Brow (independently read by me): Normal sinus rhythm at 83 bpm.  Right bundle branch block with repolarization changes.  QTc interval 4 6 0 ms.  September 2018 ECG (independently read by me): normal sinus rhythm at 79 bpm.  Right bundle-branch block.  QTc interval 481 ms.  June 2016 ECG (independently read by me):  Normal sinus rhythm with right bundle branch block.  October 2015ECG (independently read by me): Normal sinus rhythm at 71 beats per minute right bundle branch block with repolarization changes.  Normal intervals.  Prior July 2014 ECG sinus rhythm with incomplete right bundle branch block. QRS duration 118 ms. Nonspecific ST-T changes.  LABS: BMP Latest Ref Rng & Units 05/02/2018 05/01/2018 09/14/2012  Glucose 70 - 99 mg/dL - 236(H) 181(H)  BUN 8 - 23 mg/dL - 23 16  Creatinine 0.44 - 1.00 mg/dL 0.72 0.95 0.69  Sodium 135 - 145 mmol/L - 135 131(L)  Potassium 3.5 - 5.1 mmol/L - 3.6 4.5  Chloride 98 - 111 mmol/L - 96(L) 97  CO2 22 - 32 mmol/L - 28 27  Calcium 8.9 - 10.3 mg/dL - 9.3 8.6   Hepatic Function Latest Ref Rng & Units 05/01/2018  Total Protein 6.5 - 8.1 g/dL 6.5  Albumin 3.5 - 5.0 g/dL 3.1(L)  AST 15 - 41 U/L 26  ALT 0 - 44 U/L 30  Alk Phosphatase 38 - 126 U/L 64  Total Bilirubin 0.3 -  1.2 mg/dL 0.3   CBC Latest Ref Rng & Units 05/02/2018 05/01/2018 09/15/2017  WBC 4.0 - 10.5 K/uL 10.3 10.9(H) 17.2(H)  Hemoglobin 12.0 - 15.0 g/dL 11.8(L) 12.6 12.8  Hematocrit 36.0 - 46.0 % 35.7(L) 38.0 37.5  Platelets 150 - 400 K/uL 268 242 235   Lab Results  Component Value Date   MCV 94.4 05/02/2018   MCV 94.3 05/01/2018   MCV 89.9 09/15/2017   No results found for: TSH  Lab Results  Component Value Date   HGBA1C 7.5 (H) 05/02/2018    Lipid Panel  No results found for: CHOL, TRIG, HDL, CHOLHDL, VLDL, LDLCALC, LDLDIRECT    RADIOLOGY: No results found.  IMPRESSION:  1. OSA  (obstructive sleep apnea): On BiPAP   2. Essential hypertension   3. Ankle edema   4. Morbid obesity ([place])   5. Type 2 diabetes mellitus with complication, without long-term current use of insulin ([place])   6. Psoriatic arthritis ([place])     ASSESSMENT AND PLAN: Ms. Meinhardt is a 72 year old  female who has a history of hypertension, obesity, psoriatic arthritis, complex sleep apnea, as well as a remote history of DVT.    [name] [name] sees [name] for rheumatology and Dr. Cristina Gong for her lymphocytic colitis.  [name] [name] had broken her ankle over the past year and this has significantly limited her mobility and ability to walk and exercise.  When I last saw her, her weight had increased to 253 pounds.  Her weight today is further increased to 262 pounds.  Her blood pressure today is stable on lisinopril HCT 20/12.5 mg and [name] [name] has been taking extra HCTZ 12.5 mg on a as needed basis.  [name] [name] has continued to experience some ankle swelling.  I have suggested rather than taking extra HCTZ that [name] [name] take an extra dose of furosemide 20 mg as needed for edema for hopeful more effective diuresis.  I obtained a download in the office today.  [name] [name] continues to be compliant and is averaging approximately 9 hours of sleep with BiPAP daily.  Her maximum titrated IPAP pressure is increased to 20 cm with a average device IPAP pressure of 18.1.  Her maximum titrated EPAP pressure was 16.  Her AHI remains elevated at 17.9.  I have recommended some mild adjustments to therapy.  I will increase her pressure support from 4 up to 5 cm of pressure.  Rather than initiating treatment with a EPAP of 8 I will start EPAP at 9 cm and change her potential IPAP maximum up to 22.  Hopefully this will allow for improved therapy.  Now that her boot is off her foot, we discussed the importance of trying to resume exercise and walking.  [name] [name] is diabetic on metformin.  [name] [name] takes omeprazole for GERD.  [name] [name] is now on Cimzia in addition to methotrexate for her  psoriatic arthritis.  I will see her in 6 months for evaluation.  Time spent: 25 minutes  Troy Sine, MD, [facility]  07/15/2018 7:44 AM

## 2018-07-11 NOTE — Telephone Encounter (Signed)
-----   Message from Silverio Lay, RN sent at 07/11/2018  2:05 PM EST ----- Send orders for Bipap pressure changes  EPAP min 9 IPAP max 22 Pressure support 5  Thanks!

## 2018-07-11 NOTE — Patient Instructions (Signed)
Medication Instructions:  START furosemide (Lasix) 20 mg daily until swelling resolves then take as needed for swelling  If you need a refill on your cardiac medications before your next appointment, please call your pharmacy.   Follow-Up: At [facility], you and your health needs are our priority.  As part of our continuing mission to provide you with exceptional heart care, we have created designated Provider Care Teams.  These Care Teams include your primary Cardiologist (physician) and Advanced Practice Providers (APPs -  Physician Assistants and Nurse Practitioners) who all work together to provide you with the care you need, when you need it. You will need a follow up appointment in 6 months.  Please call our office 2 months in advance to schedule this appointment.  You may see Dr. Claiborne Billings or one of the following Advanced Practice Providers on your designated Care Team: [place], [place] . Fabian Sharp, PA-C

## 2018-07-11 NOTE — Telephone Encounter (Signed)
Order for BIPAP pressure change faxed to [facility] per VO/Dr Claiborne Billings.

## 2018-07-12 ENCOUNTER — Other Ambulatory Visit: Payer: Self-pay | Admitting: Cardiovascular Disease

## 2018-07-12 NOTE — Telephone Encounter (Signed)
New Message            *STAT* If patient is at the pharmacy, call can be transferred to refill team.   1. Which medications need to be refilled? (please list name of each medication and dose if known) Patient not of name, but it was a Diuretic  2. Which pharmacy/location (including street and city if local pharmacy) is medication to be sent to? [facility]  3. Do they need a 30 day or 90 day supply? [place]

## 2018-07-12 NOTE — Telephone Encounter (Signed)
Returned call to patient. She saw MD yesterday and he Rx'ed her fluid pill - lasix. Advised this was sent to pharmacy yesterday. She states they typically call her when a med is ready, she has not spoken to pharmacy yet.

## 2018-07-13 DIAGNOSIS — S92354D Nondisplaced fracture of fifth metatarsal bone, right foot, subsequent encounter for fracture with routine healing: Secondary | ICD-10-CM | POA: Diagnosis not present

## 2018-07-13 DIAGNOSIS — E11621 Type 2 diabetes mellitus with foot ulcer: Secondary | ICD-10-CM | POA: Insufficient documentation

## 2018-07-13 DIAGNOSIS — M79671 Pain in right foot: Secondary | ICD-10-CM | POA: Diagnosis not present

## 2018-07-13 DIAGNOSIS — L97519 Non-pressure chronic ulcer of other part of right foot with unspecified severity: Secondary | ICD-10-CM | POA: Diagnosis not present

## 2018-07-13 DIAGNOSIS — L97509 Non-pressure chronic ulcer of other part of unspecified foot with unspecified severity: Secondary | ICD-10-CM | POA: Insufficient documentation

## 2018-07-14 DIAGNOSIS — R1030 Lower abdominal pain, unspecified: Secondary | ICD-10-CM | POA: Diagnosis not present

## 2018-07-14 DIAGNOSIS — S92354D Nondisplaced fracture of fifth metatarsal bone, right foot, subsequent encounter for fracture with routine healing: Secondary | ICD-10-CM | POA: Diagnosis not present

## 2018-07-14 DIAGNOSIS — K5289 Other specified noninfective gastroenteritis and colitis: Secondary | ICD-10-CM | POA: Diagnosis not present

## 2018-07-14 DIAGNOSIS — R740 Nonspecific elevation of levels of transaminase and lactic acid dehydrogenase [LDH]: Secondary | ICD-10-CM | POA: Diagnosis not present

## 2018-07-15 ENCOUNTER — Encounter: Payer: Self-pay | Admitting: Cardiovascular Disease

## 2018-07-18 DIAGNOSIS — S92354D Nondisplaced fracture of fifth metatarsal bone, right foot, subsequent encounter for fracture with routine healing: Secondary | ICD-10-CM | POA: Diagnosis not present

## 2018-07-18 DIAGNOSIS — L4059 Other psoriatic arthropathy: Secondary | ICD-10-CM | POA: Diagnosis not present

## 2018-07-21 ENCOUNTER — Ambulatory Visit (INDEPENDENT_AMBULATORY_CARE_PROVIDER_SITE_OTHER): Payer: Medicare Other | Admitting: Podiatry

## 2018-07-21 DIAGNOSIS — M79674 Pain in right toe(s): Secondary | ICD-10-CM

## 2018-07-21 DIAGNOSIS — B351 Tinea unguium: Secondary | ICD-10-CM | POA: Diagnosis not present

## 2018-07-21 DIAGNOSIS — E1142 Type 2 diabetes mellitus with diabetic polyneuropathy: Secondary | ICD-10-CM

## 2018-07-21 DIAGNOSIS — M79675 Pain in left toe(s): Secondary | ICD-10-CM | POA: Diagnosis not present

## 2018-07-21 DIAGNOSIS — L84 Corns and callosities: Secondary | ICD-10-CM

## 2018-07-21 DIAGNOSIS — M1990 Unspecified osteoarthritis, unspecified site: Secondary | ICD-10-CM | POA: Insufficient documentation

## 2018-07-21 DIAGNOSIS — R32 Unspecified urinary incontinence: Secondary | ICD-10-CM | POA: Insufficient documentation

## 2018-07-21 NOTE — Patient Instructions (Signed)
Diabetes and Foot Care Diabetes may cause you to have problems because of poor blood supply (circulation) to your feet and legs. This may cause the skin on your feet to become thinner, break easier, and heal more slowly. Your skin may become dry, and the skin may peel and crack. You may also have nerve damage in your legs and feet causing decreased feeling in them. You may not notice minor injuries to your feet that could lead to infections or more serious problems. Taking care of your feet is one of the most important things you can do for yourself. Follow these instructions at home:  Wear shoes at all times, even in the house. Do not go barefoot. Bare feet are easily injured.  Check your feet daily for blisters, cuts, and redness. If you cannot see the bottom of your feet, use a mirror or ask someone for help.  Wash your feet with warm water (do not use hot water) and mild soap. Then pat your feet and the areas between your toes until they are completely dry. Do not soak your feet as this can dry your skin.  Apply a moisturizing lotion or petroleum jelly (that does not contain alcohol and is unscented) to the skin on your feet and to dry, brittle toenails. Do not apply lotion between your toes.  Trim your toenails straight across. Do not dig under them or around the cuticle. File the edges of your nails with an emery board or nail file.  Do not cut corns or calluses or try to remove them with medicine.  Wear clean socks or stockings every day. Make sure they are not too tight. Do not wear knee-high stockings since they may decrease blood flow to your legs.  Wear shoes that fit properly and have enough cushioning. To break in new shoes, wear them for just a few hours a day. This prevents you from injuring your feet. Always look in your shoes before you put them on to be sure there are no objects inside.  Do not cross your legs. This may decrease the blood flow to your feet.  If you find a  minor scrape, cut, or break in the skin on your feet, keep it and the skin around it clean and dry. These areas may be cleansed with mild soap and water. Do not cleanse the area with peroxide, alcohol, or iodine.  When you remove an adhesive bandage, be sure not to damage the skin around it.  If you have a wound, look at it several times a day to make sure it is healing.  Do not use heating pads or hot water bottles. They may burn your skin. If you have lost feeling in your feet or legs, you may not know it is happening until it is too late.  Make sure your health care provider performs a complete foot exam at least annually or more often if you have foot problems. Report any cuts, sores, or bruises to your health care provider immediately. Contact a health care provider if:  You have an injury that is not healing.  You have cuts or breaks in the skin.  You have an ingrown nail.  You notice redness on your legs or feet.  You feel burning or tingling in your legs or feet.  You have pain or cramps in your legs and feet.  Your legs or feet are numb.  Your feet always feel cold. Get help right away if:  There is increasing   redness, swelling, or pain in or around a wound.  There is a red line that goes up your leg.  Pus is coming from a wound.  You develop a fever or as directed by your health care provider.  You notice a bad smell coming from an ulcer or wound. This information is not intended to replace advice given to you by your health care provider. Make sure you discuss any questions you have with your health care provider. Document Released: 07/24/2000 Document Revised: 01/02/2016 Document Reviewed: 01/03/2013 Elsevier Interactive Patient Education  2017 [employer]. Onychomycosis/Fungal Toenails  WHAT IS IT? An infection that lies within the keratin of your nail plate that is caused by a fungus.  WHY ME? Fungal infections affect all ages, sexes, races, and creeds.   There may be many factors that predispose you to a fungal infection such as age, coexisting medical conditions such as diabetes, or an autoimmune disease; stress, medications, fatigue, genetics, etc.  Bottom line: fungus thrives in a warm, moist environment and your shoes offer such a location.  IS IT CONTAGIOUS? Theoretically, yes.  You do not want to share shoes, nail clippers or files with someone who has fungal toenails.  Walking around barefoot in the same room or sleeping in the same bed is unlikely to transfer the organism.  It is important to realize, however, that fungus can spread easily from one nail to the next on the same foot.  HOW DO WE TREAT THIS?  There are several ways to treat this condition.  Treatment may depend on many factors such as age, medications, pregnancy, liver and kidney conditions, etc.  It is best to ask your doctor which options are available to you.  1. No treatment.   Unlike many other medical concerns, you can live with this condition.  However for many people this can be a painful condition and may lead to ingrown toenails or a bacterial infection.  It is recommended that you keep the nails cut short to help reduce the amount of fungal nail. 2. Topical treatment.  These range from herbal remedies to prescription strength nail lacquers.  About 40-50% effective, topicals require twice daily application for approximately 9 to 12 months or until an entirely new nail has grown out.  The most effective topicals are medical grade medications available through physicians offices. 3. Oral antifungal medications.  With an 80-90% cure rate, the most common oral medication requires 3 to 4 months of therapy and stays in your system for a year as the new nail grows out.  Oral antifungal medications do require blood work to make sure it is a safe drug for you.  A liver function panel will be performed prior to starting the medication and after the first month of treatment.  It is  important to have the blood work performed to avoid any harmful side effects.  In general, this medication safe but blood work is required. 4. Laser Therapy.  This treatment is performed by applying a specialized laser to the affected nail plate.  This therapy is noninvasive, fast, and non-painful.  It is not covered by insurance and is therefore, out of pocket.  The results have been very good with a 80-95% cure rate.  [facility] is the only practice in the area to offer this therapy. 5. Permanent Nail Avulsion.  Removing the entire nail so that a new nail will not grow back. 

## 2018-07-22 DIAGNOSIS — S92354D Nondisplaced fracture of fifth metatarsal bone, right foot, subsequent encounter for fracture with routine healing: Secondary | ICD-10-CM | POA: Diagnosis not present

## 2018-07-28 DIAGNOSIS — Z0000 Encounter for general adult medical examination without abnormal findings: Secondary | ICD-10-CM | POA: Diagnosis not present

## 2018-07-28 DIAGNOSIS — R1030 Lower abdominal pain, unspecified: Secondary | ICD-10-CM | POA: Diagnosis not present

## 2018-07-28 DIAGNOSIS — Z1389 Encounter for screening for other disorder: Secondary | ICD-10-CM | POA: Diagnosis not present

## 2018-07-28 DIAGNOSIS — S92354D Nondisplaced fracture of fifth metatarsal bone, right foot, subsequent encounter for fracture with routine healing: Secondary | ICD-10-CM | POA: Diagnosis not present

## 2018-07-28 DIAGNOSIS — Z23 Encounter for immunization: Secondary | ICD-10-CM | POA: Diagnosis not present

## 2018-07-28 DIAGNOSIS — F325 Major depressive disorder, single episode, in full remission: Secondary | ICD-10-CM | POA: Diagnosis not present

## 2018-07-28 DIAGNOSIS — I10 Essential (primary) hypertension: Secondary | ICD-10-CM | POA: Diagnosis not present

## 2018-07-28 DIAGNOSIS — R740 Nonspecific elevation of levels of transaminase and lactic acid dehydrogenase [LDH]: Secondary | ICD-10-CM | POA: Diagnosis not present

## 2018-07-28 DIAGNOSIS — E119 Type 2 diabetes mellitus without complications: Secondary | ICD-10-CM | POA: Diagnosis not present

## 2018-07-28 DIAGNOSIS — E559 Vitamin D deficiency, unspecified: Secondary | ICD-10-CM | POA: Diagnosis not present

## 2018-07-28 DIAGNOSIS — E871 Hypo-osmolality and hyponatremia: Secondary | ICD-10-CM | POA: Diagnosis not present

## 2018-08-09 DIAGNOSIS — Z1231 Encounter for screening mammogram for malignant neoplasm of breast: Secondary | ICD-10-CM | POA: Diagnosis not present

## 2018-08-15 DIAGNOSIS — M25561 Pain in right knee: Secondary | ICD-10-CM | POA: Diagnosis not present

## 2018-08-15 DIAGNOSIS — M25562 Pain in left knee: Secondary | ICD-10-CM | POA: Diagnosis not present

## 2018-08-15 DIAGNOSIS — M1711 Unilateral primary osteoarthritis, right knee: Secondary | ICD-10-CM | POA: Diagnosis not present

## 2018-08-15 DIAGNOSIS — L4059 Other psoriatic arthropathy: Secondary | ICD-10-CM | POA: Diagnosis not present

## 2018-08-18 DIAGNOSIS — S92354D Nondisplaced fracture of fifth metatarsal bone, right foot, subsequent encounter for fracture with routine healing: Secondary | ICD-10-CM | POA: Diagnosis not present

## 2018-08-21 ENCOUNTER — Encounter: Payer: Self-pay | Admitting: Podiatry

## 2018-08-21 NOTE — Progress Notes (Signed)
Subjective: [name] [name] presents with diabetes, diabetic neuropathy and cc of painful, discolored, thick toenails and painful callus/corn which interfere with activities of daily living. Pain is aggravated when wearing enclosed shoe gear. Pain is relieved with periodic professional debridement.  [name] [name], [name] Cabal, MD    Current Outpatient Medications:  .  allopurinol (ZYLOPRIM) 100 MG tablet, Take 100 mg by mouth 2 (two) times daily., Disp: , Rfl:  .  aspirin 81 MG tablet, Take 81 mg by mouth every other day. , Disp: , Rfl:  .  Budesonide (UCERIS) 9 MG TB24, Take 9 mg by mouth daily. , Disp: , Rfl:  .  Certolizumab Pegol (CIMZIA [name] [name]), Inject 400 mg into the skin every 30 (thirty) days. 200 mg on each side of stomach, Disp: , Rfl:  .  Cholecalciferol (VITAMIN D3) 1000 UNITS CAPS, Take 1,000 Units by mouth 2 (two) times daily. , Disp: , Rfl:  .  citalopram (CELEXA) 40 MG tablet, Take 40 mg by mouth daily., Disp: , Rfl:  .  Cyanocobalamin (VITAMIN B-12) 1000 MCG SUBL, Place 1,000 mcg under the tongue daily., Disp: , Rfl:  .  cyclobenzaprine (FLEXERIL) 10 MG tablet, Take 10 mg by mouth daily. , Disp: , Rfl:  .  Dietary Management Product (RHEUMATE) CAPS, Take 1 capsule by mouth daily., Disp: , Rfl: 0 .  doxycycline (VIBRA-TABS) 100 MG tablet, doxycycline hyclate 100 mg tabs, Disp: , Rfl:  .  furosemide (LASIX) 20 MG tablet, Take 1 tablet (20 mg total) by mouth as needed (swelling)., Disp: 30 tablet, Rfl: 3 .  lisinopril-hydrochlorothiazide (PRINZIDE,ZESTORETIC) 20-12.5 MG per tablet, Take 1 tablet by mouth every morning., Disp: , Rfl:  .  loperamide (IMODIUM) 2 MG capsule, Take 6 mg by mouth 2 (two) times daily., Disp: , Rfl:  .  Melatonin 5 MG SUBL, Place 5 mg under the tongue at bedtime., Disp: , Rfl:  .  metFORMIN (GLUCOPHAGE) 500 MG tablet, Take 500 mg by mouth 2 (two) times daily with a meal., Disp: , Rfl:  .  methotrexate (RHEUMATREX) 2.5 MG tablet, Take 20 mg by mouth once a week.  Caution: Chemotherapy. Protect from light. Takes on Friday, Disp: , Rfl:  .  mupirocin cream (BACTROBAN) 2 %, Apply 1 application topically 2 (two) times daily as needed (psoriasis). , Disp: , Rfl:  .  omeprazole (PRILOSEC) 40 MG capsule, Take 40 mg by mouth at bedtime., Disp: , Rfl:  .  PRESCRIPTION MEDICATION, Place 3 drops into both ears as needed. Fluocinolone 0.01% Ear Drops. For inflammation and itching of the ear., Disp: , Rfl:  .  Probiotic Product (ALIGN PO), Take 1 capsule by mouth daily., Disp: , Rfl:  .  protein supplement shake (PREMIER PROTEIN) LIQD, Take 325 mLs (11 oz total) by mouth 2 (two) times daily between meals., Disp: 60 Can, Rfl: 0 .  hydrochlorothiazide (MICROZIDE) 12.5 MG capsule, Take 1 capsule (12.5 mg total) by mouth as needed (swelling)., Disp: 30 capsule, Rfl: 3  Allergies  Allergen Reactions  . Other Shortness Of Breath    Blue fish: palms and feet turn red  . Bee Venom Swelling  . Sulfa Antibiotics Hives    Vascular Examination: Capillary refill time <3 seconds x 10 digits Dorsalis pedis and Posterior tibial pulses present b/l No digital hair x 10 digits Skin temperature WNL b/l  Dermatological Examination: Skin with normal turgor, texture and tone b/l  Toenails 1-5 b/l discolored, thick, dystrophic with subungual debris and pain with palpation to nailbeds  due to thickness of nails.  Hyperkeratotic lesion plantarmedial midfoot right foot, plantarmedial hallux IPJ b/l. No erythema, no edema, no flocculence, no drainage, no warmth.  Musculoskeletal: Muscle strength 5/5 to all LE muscle groups  Neurological: Sensation diminished with 10 gram monofilament. Vibratory sensation diminished  Assessment: 1. Painful onychomycosis toenails 1-5 b/l 2. Callus plantarmedial midfoot right foot, plantarmedial hallux IPJ b/l 3. NIDDM with Diabetic neuropathy  Plan: 1. Continue diabetic foot care principles.  2. Toenails 1-5 b/l were debrided in length and  girth without iatrogenic bleeding. 3. Hyperkeratotic lesion pared with sterile chisel blade plantarmedial midfoot right foot, plantarmedial hallux IPJ b/l without incident 4. Patient to continue soft, supportive shoe gear 5. Patient to report any pedal injuries to medical professional  6. Follow up 3 months. Patient/POA to call should there be a concern in the interim.

## 2018-08-30 DIAGNOSIS — S92354D Nondisplaced fracture of fifth metatarsal bone, right foot, subsequent encounter for fracture with routine healing: Secondary | ICD-10-CM | POA: Diagnosis not present

## 2018-09-02 DIAGNOSIS — S92354D Nondisplaced fracture of fifth metatarsal bone, right foot, subsequent encounter for fracture with routine healing: Secondary | ICD-10-CM | POA: Diagnosis not present

## 2018-09-06 ENCOUNTER — Ambulatory Visit: Payer: Medicare Other | Admitting: Cardiovascular Disease

## 2018-09-06 ENCOUNTER — Encounter

## 2018-09-07 DIAGNOSIS — S92354D Nondisplaced fracture of fifth metatarsal bone, right foot, subsequent encounter for fracture with routine healing: Secondary | ICD-10-CM | POA: Diagnosis not present

## 2018-09-12 DIAGNOSIS — S92354D Nondisplaced fracture of fifth metatarsal bone, right foot, subsequent encounter for fracture with routine healing: Secondary | ICD-10-CM | POA: Diagnosis not present

## 2018-09-12 DIAGNOSIS — L4059 Other psoriatic arthropathy: Secondary | ICD-10-CM | POA: Diagnosis not present

## 2018-09-13 DIAGNOSIS — S81819A Laceration without foreign body, unspecified lower leg, initial encounter: Secondary | ICD-10-CM | POA: Diagnosis not present

## 2018-09-22 DIAGNOSIS — Z4802 Encounter for removal of sutures: Secondary | ICD-10-CM | POA: Diagnosis not present

## 2018-09-22 DIAGNOSIS — R81 Glycosuria: Secondary | ICD-10-CM | POA: Diagnosis not present

## 2018-09-22 DIAGNOSIS — R300 Dysuria: Secondary | ICD-10-CM | POA: Diagnosis not present

## 2018-09-22 DIAGNOSIS — R509 Fever, unspecified: Secondary | ICD-10-CM | POA: Diagnosis not present

## 2018-09-22 DIAGNOSIS — J069 Acute upper respiratory infection, unspecified: Secondary | ICD-10-CM | POA: Diagnosis not present

## 2018-09-26 DIAGNOSIS — S81801A Unspecified open wound, right lower leg, initial encounter: Secondary | ICD-10-CM | POA: Diagnosis not present

## 2018-09-30 DIAGNOSIS — E876 Hypokalemia: Secondary | ICD-10-CM | POA: Diagnosis not present

## 2018-09-30 DIAGNOSIS — K625 Hemorrhage of anus and rectum: Secondary | ICD-10-CM | POA: Diagnosis not present

## 2018-09-30 DIAGNOSIS — K559 Vascular disorder of intestine, unspecified: Secondary | ICD-10-CM | POA: Diagnosis not present

## 2018-09-30 DIAGNOSIS — N390 Urinary tract infection, site not specified: Secondary | ICD-10-CM | POA: Diagnosis not present

## 2018-09-30 DIAGNOSIS — I10 Essential (primary) hypertension: Secondary | ICD-10-CM | POA: Diagnosis not present

## 2018-09-30 DIAGNOSIS — M25561 Pain in right knee: Secondary | ICD-10-CM | POA: Diagnosis not present

## 2018-09-30 DIAGNOSIS — M25461 Effusion, right knee: Secondary | ICD-10-CM | POA: Diagnosis not present

## 2018-09-30 DIAGNOSIS — Z789 Other specified health status: Secondary | ICD-10-CM | POA: Diagnosis not present

## 2018-09-30 DIAGNOSIS — R1030 Lower abdominal pain, unspecified: Secondary | ICD-10-CM | POA: Diagnosis not present

## 2018-09-30 DIAGNOSIS — M7989 Other specified soft tissue disorders: Secondary | ICD-10-CM | POA: Diagnosis not present

## 2018-09-30 DIAGNOSIS — M79604 Pain in right leg: Secondary | ICD-10-CM | POA: Diagnosis not present

## 2018-09-30 DIAGNOSIS — E119 Type 2 diabetes mellitus without complications: Secondary | ICD-10-CM | POA: Diagnosis not present

## 2018-09-30 DIAGNOSIS — K52832 Lymphocytic colitis: Secondary | ICD-10-CM | POA: Diagnosis not present

## 2018-10-01 DIAGNOSIS — R1030 Lower abdominal pain, unspecified: Secondary | ICD-10-CM | POA: Diagnosis not present

## 2018-10-01 DIAGNOSIS — E1165 Type 2 diabetes mellitus with hyperglycemia: Secondary | ICD-10-CM | POA: Diagnosis not present

## 2018-10-01 DIAGNOSIS — K625 Hemorrhage of anus and rectum: Secondary | ICD-10-CM | POA: Diagnosis not present

## 2018-10-01 DIAGNOSIS — I10 Essential (primary) hypertension: Secondary | ICD-10-CM | POA: Diagnosis not present

## 2018-10-01 DIAGNOSIS — K559 Vascular disorder of intestine, unspecified: Secondary | ICD-10-CM | POA: Diagnosis not present

## 2018-10-02 DIAGNOSIS — A0472 Enterocolitis due to Clostridium difficile, not specified as recurrent: Secondary | ICD-10-CM | POA: Diagnosis not present

## 2018-10-02 DIAGNOSIS — K529 Noninfective gastroenteritis and colitis, unspecified: Secondary | ICD-10-CM | POA: Diagnosis not present

## 2018-10-02 DIAGNOSIS — R1030 Lower abdominal pain, unspecified: Secondary | ICD-10-CM | POA: Diagnosis not present

## 2018-10-02 DIAGNOSIS — K921 Melena: Secondary | ICD-10-CM | POA: Diagnosis not present

## 2018-10-03 DIAGNOSIS — I10 Essential (primary) hypertension: Secondary | ICD-10-CM | POA: Diagnosis present

## 2018-10-03 DIAGNOSIS — K869 Disease of pancreas, unspecified: Secondary | ICD-10-CM | POA: Diagnosis present

## 2018-10-03 DIAGNOSIS — K8020 Calculus of gallbladder without cholecystitis without obstruction: Secondary | ICD-10-CM | POA: Diagnosis not present

## 2018-10-03 DIAGNOSIS — R945 Abnormal results of liver function studies: Secondary | ICD-10-CM | POA: Diagnosis not present

## 2018-10-03 DIAGNOSIS — N390 Urinary tract infection, site not specified: Secondary | ICD-10-CM | POA: Diagnosis not present

## 2018-10-03 DIAGNOSIS — B9620 Unspecified Escherichia coli [E. coli] as the cause of diseases classified elsewhere: Secondary | ICD-10-CM | POA: Diagnosis not present

## 2018-10-03 DIAGNOSIS — E876 Hypokalemia: Secondary | ICD-10-CM | POA: Diagnosis present

## 2018-10-03 DIAGNOSIS — E119 Type 2 diabetes mellitus without complications: Secondary | ICD-10-CM | POA: Diagnosis present

## 2018-10-03 DIAGNOSIS — K921 Melena: Secondary | ICD-10-CM | POA: Diagnosis not present

## 2018-10-03 DIAGNOSIS — K559 Vascular disorder of intestine, unspecified: Secondary | ICD-10-CM | POA: Diagnosis not present

## 2018-10-03 DIAGNOSIS — K625 Hemorrhage of anus and rectum: Secondary | ICD-10-CM | POA: Diagnosis present

## 2018-10-03 DIAGNOSIS — R1030 Lower abdominal pain, unspecified: Secondary | ICD-10-CM | POA: Diagnosis not present

## 2018-10-03 DIAGNOSIS — A0472 Enterocolitis due to Clostridium difficile, not specified as recurrent: Secondary | ICD-10-CM | POA: Diagnosis not present

## 2018-10-03 DIAGNOSIS — F329 Major depressive disorder, single episode, unspecified: Secondary | ICD-10-CM | POA: Diagnosis present

## 2018-10-03 DIAGNOSIS — K52832 Lymphocytic colitis: Secondary | ICD-10-CM | POA: Diagnosis present

## 2018-10-03 DIAGNOSIS — K760 Fatty (change of) liver, not elsewhere classified: Secondary | ICD-10-CM | POA: Diagnosis not present

## 2018-10-03 DIAGNOSIS — K219 Gastro-esophageal reflux disease without esophagitis: Secondary | ICD-10-CM | POA: Diagnosis present

## 2018-10-03 DIAGNOSIS — K529 Noninfective gastroenteritis and colitis, unspecified: Secondary | ICD-10-CM | POA: Diagnosis not present

## 2018-10-06 DIAGNOSIS — K559 Vascular disorder of intestine, unspecified: Secondary | ICD-10-CM | POA: Insufficient documentation

## 2018-10-13 DIAGNOSIS — M25562 Pain in left knee: Secondary | ICD-10-CM | POA: Insufficient documentation

## 2018-10-13 DIAGNOSIS — L4059 Other psoriatic arthropathy: Secondary | ICD-10-CM | POA: Diagnosis not present

## 2018-10-13 DIAGNOSIS — M25571 Pain in right ankle and joints of right foot: Secondary | ICD-10-CM | POA: Diagnosis not present

## 2018-10-13 DIAGNOSIS — Z79899 Other long term (current) drug therapy: Secondary | ICD-10-CM | POA: Diagnosis not present

## 2018-10-13 DIAGNOSIS — M1711 Unilateral primary osteoarthritis, right knee: Secondary | ICD-10-CM | POA: Diagnosis not present

## 2018-10-13 DIAGNOSIS — M25572 Pain in left ankle and joints of left foot: Secondary | ICD-10-CM | POA: Diagnosis not present

## 2018-10-14 DIAGNOSIS — N3000 Acute cystitis without hematuria: Secondary | ICD-10-CM | POA: Diagnosis not present

## 2018-10-14 DIAGNOSIS — R978 Other abnormal tumor markers: Secondary | ICD-10-CM | POA: Diagnosis not present

## 2018-10-14 DIAGNOSIS — K869 Disease of pancreas, unspecified: Secondary | ICD-10-CM | POA: Diagnosis not present

## 2018-10-14 DIAGNOSIS — M1711 Unilateral primary osteoarthritis, right knee: Secondary | ICD-10-CM | POA: Diagnosis not present

## 2018-10-14 DIAGNOSIS — K529 Noninfective gastroenteritis and colitis, unspecified: Secondary | ICD-10-CM | POA: Diagnosis not present

## 2018-10-14 DIAGNOSIS — E876 Hypokalemia: Secondary | ICD-10-CM | POA: Diagnosis not present

## 2018-10-19 ENCOUNTER — Other Ambulatory Visit: Payer: Self-pay | Admitting: Gastroenterology

## 2018-10-19 DIAGNOSIS — R9389 Abnormal findings on diagnostic imaging of other specified body structures: Secondary | ICD-10-CM | POA: Diagnosis not present

## 2018-10-19 DIAGNOSIS — K559 Vascular disorder of intestine, unspecified: Secondary | ICD-10-CM | POA: Diagnosis not present

## 2018-10-19 DIAGNOSIS — K869 Disease of pancreas, unspecified: Secondary | ICD-10-CM | POA: Diagnosis not present

## 2018-10-19 DIAGNOSIS — R978 Other abnormal tumor markers: Secondary | ICD-10-CM | POA: Diagnosis not present

## 2018-10-19 DIAGNOSIS — K5289 Other specified noninfective gastroenteritis and colitis: Secondary | ICD-10-CM | POA: Diagnosis not present

## 2018-10-19 DIAGNOSIS — R1032 Left lower quadrant pain: Secondary | ICD-10-CM | POA: Diagnosis not present

## 2018-10-20 ENCOUNTER — Ambulatory Visit: Payer: Medicare Other | Admitting: Podiatry

## 2018-10-26 DIAGNOSIS — K625 Hemorrhage of anus and rectum: Secondary | ICD-10-CM | POA: Diagnosis not present

## 2018-10-26 DIAGNOSIS — R1032 Left lower quadrant pain: Secondary | ICD-10-CM | POA: Diagnosis not present

## 2018-11-01 ENCOUNTER — Ambulatory Visit: Payer: Medicare Other | Admitting: Podiatry

## 2018-11-02 ENCOUNTER — Other Ambulatory Visit: Payer: Self-pay

## 2018-11-02 ENCOUNTER — Ambulatory Visit
Admission: RE | Admit: 2018-11-02 | Discharge: 2018-11-02 | Disposition: A | Discharge status: Home / Self Care | Payer: Medicare Other | Source: Ambulatory Visit | Attending: Gastroenterology | Admitting: Gastroenterology

## 2018-11-02 ENCOUNTER — Encounter: Payer: Self-pay | Admitting: Radiology

## 2018-11-02 ENCOUNTER — Ambulatory Visit: Payer: Medicare Other | Admitting: Podiatry

## 2018-11-02 DIAGNOSIS — K869 Disease of pancreas, unspecified: Secondary | ICD-10-CM | POA: Diagnosis not present

## 2018-11-02 MED ORDER — IOPAMIDOL (ISOVUE-300) INJECTION 61%
100.0000 mL | Freq: Once | INTRAVENOUS | Status: AC | PRN
  Administered 2018-11-02: 100 mL via INTRAVENOUS

## 2018-11-09 ENCOUNTER — Other Ambulatory Visit: Payer: Self-pay | Admitting: Cardiovascular Disease

## 2018-11-09 NOTE — Telephone Encounter (Signed)
Furosemide refill

## 2018-11-10 DIAGNOSIS — L4059 Other psoriatic arthropathy: Secondary | ICD-10-CM | POA: Diagnosis not present

## 2018-11-11 DIAGNOSIS — D649 Anemia, unspecified: Secondary | ICD-10-CM | POA: Diagnosis not present

## 2018-11-11 DIAGNOSIS — R971 Elevated cancer antigen 125 [CA 125]: Secondary | ICD-10-CM | POA: Diagnosis not present

## 2018-11-11 DIAGNOSIS — E876 Hypokalemia: Secondary | ICD-10-CM | POA: Diagnosis not present

## 2018-12-08 DIAGNOSIS — L4059 Other psoriatic arthropathy: Secondary | ICD-10-CM | POA: Diagnosis not present

## 2018-12-22 ENCOUNTER — Encounter: Payer: Self-pay | Admitting: Podiatry

## 2018-12-22 ENCOUNTER — Ambulatory Visit (INDEPENDENT_AMBULATORY_CARE_PROVIDER_SITE_OTHER): Payer: Medicare Other

## 2018-12-22 ENCOUNTER — Other Ambulatory Visit: Payer: Self-pay | Admitting: Podiatry

## 2018-12-22 ENCOUNTER — Ambulatory Visit (INDEPENDENT_AMBULATORY_CARE_PROVIDER_SITE_OTHER): Payer: Medicare Other | Admitting: Podiatry

## 2018-12-22 ENCOUNTER — Other Ambulatory Visit: Payer: Self-pay

## 2018-12-22 VITALS — Temp 97.3°F

## 2018-12-22 DIAGNOSIS — B351 Tinea unguium: Secondary | ICD-10-CM | POA: Diagnosis not present

## 2018-12-22 DIAGNOSIS — M79672 Pain in left foot: Secondary | ICD-10-CM

## 2018-12-22 DIAGNOSIS — M79675 Pain in left toe(s): Secondary | ICD-10-CM | POA: Diagnosis not present

## 2018-12-22 DIAGNOSIS — M79674 Pain in right toe(s): Secondary | ICD-10-CM | POA: Diagnosis not present

## 2018-12-22 DIAGNOSIS — L97522 Non-pressure chronic ulcer of other part of left foot with fat layer exposed: Secondary | ICD-10-CM

## 2018-12-22 MED ORDER — CEPHALEXIN 500 MG PO CAPS: 500.0000 mg | ORAL_CAPSULE | Freq: Three times a day (TID) | ORAL | 1 refills | Status: DC

## 2018-12-22 NOTE — Progress Notes (Signed)
Subjective:   Patient ID: [name] [name], female   DOB: 73 y.o.   MRN: [id number]   HPI Patient states that had this breakdown of tissue left and I know I should have been here earlier but I was scared about the virus and it is very sore and I am limping.  Also has nail disease 1-5 both feet that she cannot take care of and she is not able to keep her appointment   ROS      Objective:  Physical Exam  Neurovascular status unchanged with patient having collapse of medial longitudinal arch bilateral with chronic ulceration left plantar arch measuring about 2 cm x 1.5 cm with subcutaneous exposure no other exposure noted and mild erythema just surrounding the immediate area with no odor or aggressive drainage noted.  Patient is noted to have thick yellow brittle nailbeds 1-5 both feet     Assessment:  Ulceration with possible localized infection left arch secondary to not having it treated for 6 months and mycotic nail infection with pain 1-5 both feet     Plan:  H&P x-ray reviewed and sterile debridement of tissue noted.  It measures approximately 2.5 cm x 1.8 cm after debridement and I did note some fresh bleeding within the middle of the area but I did not note any current drainage or odor or extension to the muscle or fascia layer.  I went ahead today flush the area out applied Iodosorb and sterile dressing placed on cephalexin 5 mg 3 times daily and gave strict instructions of any redness were to occur or any increased pain or pathology to let us know immediately and if not she will return for normal visit.  I debrided nailbeds 1-5 both feet with no iatrogenic bleeding  X-rays indicate that there is collapse medial longitudinal arch left but I did not note lysis of bone or other pathology

## 2019-01-05 DIAGNOSIS — L4059 Other psoriatic arthropathy: Secondary | ICD-10-CM | POA: Diagnosis not present

## 2019-01-09 ENCOUNTER — Other Ambulatory Visit: Payer: Self-pay

## 2019-01-09 ENCOUNTER — Telehealth: Payer: Self-pay | Admitting: Podiatry

## 2019-01-09 ENCOUNTER — Telehealth: Payer: Self-pay | Admitting: Physician Assistant

## 2019-01-09 ENCOUNTER — Ambulatory Visit (INDEPENDENT_AMBULATORY_CARE_PROVIDER_SITE_OTHER): Payer: Medicare Other | Admitting: Podiatry

## 2019-01-09 ENCOUNTER — Encounter: Payer: Self-pay | Admitting: Podiatry

## 2019-01-09 VITALS — Temp 97.3°F

## 2019-01-09 DIAGNOSIS — L97522 Non-pressure chronic ulcer of other part of left foot with fat layer exposed: Secondary | ICD-10-CM

## 2019-01-09 DIAGNOSIS — E1142 Type 2 diabetes mellitus with diabetic polyneuropathy: Secondary | ICD-10-CM

## 2019-01-09 NOTE — Telephone Encounter (Signed)
Telephone visit/call [phone]/DA chart/pre reg complete/consent obtained -- ttf

## 2019-01-09 NOTE — Telephone Encounter (Signed)
I called [name] [name] and explained I wanted to offer her an appt to be seen in office today with Dr. Paulla Dolly and [name] [name] stated she would like today's 2:45pm appt. [name] [name] is scheduled to see Dr. Paulla Dolly at 2:45pm.

## 2019-01-09 NOTE — Telephone Encounter (Signed)
Left foot sore has not gotten better/she finished taking antibiotic medication/ and she started taking cephALEXin 2 days ago and its not helping. She is also stating that it has some drainage, She wanted to know if she should come see Dr. Paulla Dolly or Dr. Elisha Ponder

## 2019-01-11 NOTE — Progress Notes (Signed)
Virtual Visit via Telephone Note   This visit type was conducted due to national recommendations for restrictions regarding the COVID-19 Pandemic (e.g. social distancing) in an effort to limit this patient's exposure and mitigate transmission in our community.  Due to her co-morbid illnesses, this patient is at least at moderate risk for complications without adequate follow up.  This format is felt to be most appropriate for this patient at this time.  The patient did not have access to video technology/had technical difficulties with video requiring transitioning to audio format only (telephone).  All issues noted in this document were discussed and addressed.  No physical exam could be performed with this format.  Please refer to the patient's chart for her  consent to telehealth for [facility].   Date:  01/12/2019   ID:  [name] [name], DOB [birth year], MRN [id number]  Patient Location: Home Provider Location: Other:  [facility]  PCP:  [name] Shirts, MD  Cardiologist:  [name] Majestic, MD  Electrophysiologist:  None   Evaluation Performed:  Follow-Up Visit  Chief Complaint:  Follow up  History of Present Illness:    [name] [name] is a 73 y.o. female with past medical history of essential hypertension, right bundle branch block, history of DVT (provoked, while traveling to [place]), OSA on BiPAP, diabetes mellitus, and morbid obesity. She was last seen in clinic with [name] [name] on 07/2018.  Her hypertension was controlled with lisinopril and HCTZ.  She was noted to take extra HCTZ as needed.  She was instructed to take an extra dose of Lasix 20 mg as needed for edema instead of the HCTZ for more effective diuresis.  She presents today for follow-up.  Since her last clinic visit, she has done well. She takes PRN lasix for swelling in ankles  -  Hasn't taken it in over 1 month. BP is well-controlled - she occasionally takes her pressure at home.   She was hospitalized in Feb  2020 in [place]. She states she became dehydrated on the flight. She had ischemic colitis and she has a large GI bleed. No blood transfusions. She transferred PCP to [name] [name] with [name] since [name] [name] retired. She has not seen the new PCP yet. [name] [name] placed her on potassium supplementation when she returned from [place] in follow up in early March. She is still taking the potassium and has not had repeat lab work.     Overall, she is doing well, no complaints.  The patient does not have symptoms concerning for COVID-19 infection (fever, chills, cough, or new shortness of breath).     Past Medical History:  Diagnosis Date  . Arthritis   . Deep vein thrombosis ([place])    right calf - 05/2012   . Diabetes mellitus without complication (HCC)    diet controlled   . GERD (gastroesophageal reflux disease)   . Hyperlipidemia   . Hypertension    Ejection fraction =>55% Left ventricular systolic function is normal. Left ventricular wall motion is normal    . Lymphocytic colitis   . Neuropathy    diabetic - in bilateral feet   . Pityriasis lichenoides chronica   . Sleep apnea    bipap   Past Surgical History:  Procedure Laterality Date  . DILATION AND CURETTAGE OF UTERUS    . HAMMER TOE SURGERY    . right hand surgery      due to blood infection   . torn meniscus repair  right knee   . TOTAL HIP ARTHROPLASTY  09/13/2012   Procedure: TOTAL HIP ARTHROPLASTY ANTERIOR APPROACH;  Surgeon: [name] Pole, MD;  Location: [facility];  Service: Orthopedics;  Laterality: Right;     Current Meds  Medication Sig  . allopurinol (ZYLOPRIM) 100 MG tablet Take 100 mg by mouth 2 (two) times daily.  Marland Kitchen aspirin 81 MG tablet Take 81 mg by mouth every other day.   . Aspirin Buf,CaCarb-MgCarb-MgO, 81 MG TABS Take by mouth.  . Budesonide (UCERIS) 9 MG TB24 Take 9 mg by mouth daily.   . cephALEXin (KEFLEX) 500 MG capsule Take 1 capsule (500 mg total) by mouth 3 (three) times daily.  .  Certolizumab Pegol (CIMZIA [place]) Inject 400 mg into the skin every 30 (thirty) days. 200 mg on each side of stomach  . Cholecalciferol (VITAMIN D3) 1000 UNITS CAPS Take 1,000 Units by mouth 2 (two) times daily.   . citalopram (CELEXA) 40 MG tablet Take 40 mg by mouth daily.  . Cyanocobalamin (VITAMIN B-12) 1000 MCG SUBL Place 1,000 mcg under the tongue daily.  . cyclobenzaprine (FLEXERIL) 10 MG tablet Take 10 mg by mouth daily.   . Dietary Management Product (RHEUMATE) CAPS Take 1 capsule by mouth daily.  Marland Kitchen doxycycline (VIBRA-TABS) 100 MG tablet doxycycline hyclate 100 mg tabs  . furosemide (LASIX) 20 MG tablet TAKE 1 TABLET BY MOUTH EVERY DAY AS NEEDED FOR SWELLING  . lisinopril-hydrochlorothiazide (PRINZIDE,ZESTORETIC) 20-12.5 MG per tablet Take 1 tablet by mouth every morning.  . loperamide (IMODIUM) 2 MG capsule Take 6 mg by mouth 2 (two) times daily.  . Melatonin 5 MG SUBL Place 5 mg under the tongue at bedtime.  . metFORMIN (GLUCOPHAGE) 500 MG tablet Take 500 mg by mouth 2 (two) times daily with a meal.  . methotrexate (RHEUMATREX) 2.5 MG tablet Take 20 mg by mouth once a week. Caution: Chemotherapy. Protect from light. Takes on Friday  . mupirocin cream (BACTROBAN) 2 % Apply 1 application topically 2 (two) times daily as needed (psoriasis).   Marland Kitchen omeprazole (PRILOSEC) 40 MG capsule Take 40 mg by mouth at bedtime.  Marland Kitchen PRESCRIPTION MEDICATION Place 3 drops into both ears as needed. Fluocinolone 0.01% Ear Drops. For inflammation and itching of the ear.  . Probiotic Product (ALIGN PO) Take 1 capsule by mouth daily.  . protein supplement shake (PREMIER PROTEIN) LIQD Take 325 mLs (11 oz total) by mouth 2 (two) times daily between meals.     Allergies:   Other; Bee venom; and Sulfa antibiotics   Social History   Tobacco Use  . Smoking status: Never Smoker  . Smokeless tobacco: Never Used  Substance Use Topics  . Alcohol use: Yes    Comment: occasional wine   . Drug use: No     Family Hx:  The patient's family history includes Hypertension in her mother.  ROS:   Please see the history of present illness.     All other systems reviewed and are negative.   Prior CV studies:   The following studies were reviewed today:  ABIs 05/02/18 Final Interpretation: Right: The right toe-brachial index is abnormal.  Left: Resting left ankle-brachial index is within normal range. No evidence of significant left lower extremity arterial disease. The left toe-brachial index is abnormal.  Labs/Other Tests and Data Reviewed:    EKG:  An ECG dated 07/18/18 was personally reviewed today and demonstrated:  sinus arrhythmia, RBBB, heart rate 78  Recent Labs: 05/01/2018: ALT 30; BUN 23; Potassium  3.6; Sodium 135 05/02/2018: Creatinine, Ser 0.72; Hemoglobin 11.8; Platelets 268   Recent Lipid Panel No results found for: CHOL, TRIG, HDL, CHOLHDL, LDLCALC, LDLDIRECT  Wt Readings from Last 3 Encounters:  01/12/19 262 lb (118.8 kg)  07/11/18 262 lb 9.6 oz (119.1 kg)  05/01/18 220 lb (99.8 kg)     Objective:    Vital Signs:  BP 128/77   Ht 5\' 6"  (1.676 m)   Wt 262 lb (118.8 kg)   SpO2 98%   BMI 42.29 kg/m    VITAL SIGNS:  reviewed GEN:  no acute distress RESPIRATORY:  normal respiratory effort, symmetric expansion NEURO:  alert and oriented x 3, no obvious focal deficit PSYCH:  normal affect  ASSESSMENT & PLAN:    1. Recent hospitalization for ischemic colitis, GI bleed  2. Hypokalemia - she is taking potassium supplementation - 10 mEq - she is following with Dr. Wallis Mart for GI - will repeat CBC and CMP - may not need potassium supplementation anymore   3. Hypertension - pressures have been well-controlled - Lisinopril-hydrochlorothiazide - Lasix 20 mg PRN - only takes 1-2 times monthly   4. OSA on BiPAP - Per the patient, she is compliant on BiPAP - replaced in Feb 2020 - She reports no problems with her BiPAP nightly   5. Morbid obesity 6. Psoriatic arthritis -  she has not been active due to bilateral foot problems and arthritis - blister on bottom of left foot - following with podiatry - she knows she needs to improve her activity   7. Diabetes mellitus type 2 -Last A1c was 7.5% (04/2018)   8. Ankle edema -Taking Lasix as needed 20 mg - only takes about 1-2 times per month - stable, no problems breathing   9. Abnormal right ABI 04/2018 - may be due to her right Charcot foot - I will reach out to [name] [name] for guidance on next steps   Labs: CMP CBC   Follow up appt: [name] [name] in 6 months   COVID-19 Education: The signs and symptoms of COVID-19 were discussed with the patient and how to seek care for testing (follow up with PCP or arrange E-visit).  The importance of social distancing was discussed today.  Time:   Today, I have spent 16 minutes with the patient with telehealth technology discussing the above problems.     Medication Adjustments/Labs and Tests Ordered: Current medicines are reviewed at length with the patient today.  Concerns regarding medicines are outlined above.   Tests Ordered: Orders Placed This Encounter  Procedures  . Comprehensive metabolic panel  . CBC    Medication Changes: No orders of the defined types were placed in this encounter.   Disposition:  Follow up in 6 month(s) with [name] [name]  Signed, [place], [place]  01/12/2019 11:59 AM    [place]

## 2019-01-11 NOTE — Progress Notes (Signed)
Subjective:   Patient ID: [name] [name], female   DOB: 73 y.o.   MRN: [id number]   HPI Patient presents stating it still bothering her and it seems that it is gotten a little bit bigger and she has been taking the antibiotic   ROS      Objective:  Physical Exam  Neurovascular status unchanged with continued significant deformity of the left foot with breakdown of tissue medial side of the left arch measuring now approximately 1.2 x 1.3 cm with local redness no proximal edema erythema drainage noted     Assessment:  Appears to be more of a low-grade breakdown of tissue with skin ulceration with no indications of systemic infective event     Plan:  H&P reviewed condition discussing the importance of offloading.  Today using sharp sterile instrumentation I debrided the area and I did expanded approximate 1.5 x 1.5 cm with no indication of bone or muscle exposure.  I flushed the area profusely and utilized Iodosorb with sterile dressing and applied thick padding to reduce all pressure against this area.  Patient will be seen back again in the next several weeks continue antibiotics and offloading and soaks at home

## 2019-01-12 ENCOUNTER — Encounter: Payer: Self-pay | Admitting: Physician Assistant

## 2019-01-12 ENCOUNTER — Telehealth (INDEPENDENT_AMBULATORY_CARE_PROVIDER_SITE_OTHER): Payer: Medicare Other | Admitting: Physician Assistant

## 2019-01-12 VITALS — BP 128/77 | Ht 66.0 in | Wt 262.0 lb

## 2019-01-12 DIAGNOSIS — G4733 Obstructive sleep apnea (adult) (pediatric): Secondary | ICD-10-CM

## 2019-01-12 DIAGNOSIS — E871 Hypo-osmolality and hyponatremia: Secondary | ICD-10-CM

## 2019-01-12 DIAGNOSIS — I4510 Unspecified right bundle-branch block: Secondary | ICD-10-CM

## 2019-01-12 DIAGNOSIS — I10 Essential (primary) hypertension: Secondary | ICD-10-CM

## 2019-01-12 DIAGNOSIS — D500 Iron deficiency anemia secondary to blood loss (chronic): Secondary | ICD-10-CM

## 2019-01-12 NOTE — Patient Instructions (Signed)
Medication Instructions:  Your physician recommends that you continue on your current medications as directed. Please refer to the Current Medication list given to you today.  If you need a refill on your cardiac medications before your next appointment, please call your pharmacy.   Lab work: Your physician recommends that you return for lab work at your earliest convenience: CMET and CBC. You can come to our office lab. No appointment is needed. You will be required to wear a mask. If you do not have one, one will be provided for you as you come off the elevator. Our office lab is open from 8AM-4PM. Our lab technician goes to lunch from 12:45-1:45PM.  If you have labs (blood work) drawn today and your tests are completely normal, you will receive your results only by: Marland Kitchen MyChart Message (if you have MyChart) OR . A paper copy in the mail If you have any lab test that is abnormal or we need to change your treatment, we will call you to review the results.  Follow-Up: At [facility], you and your health needs are our priority.  As part of our continuing mission to provide you with exceptional heart care, we have created designated Provider Care Teams.  These Care Teams include your primary Cardiologist (physician) and Advanced Practice Providers (APPs -  Physician Assistants and Nurse Practitioners) who all work together to provide you with the care you need, when you need it. You will need a follow up appointment in 6 months.  Please call our office 2 months in advance to schedule this appointment.  You may see Shelva Majestic, MD or one of the following Advanced Practice Providers on your designated Care Team: [place], [place] . Fabian Sharp, PA-C

## 2019-01-13 ENCOUNTER — Ambulatory Visit: Payer: Medicare Other | Admitting: Cardiovascular Disease

## 2019-01-13 DIAGNOSIS — G4733 Obstructive sleep apnea (adult) (pediatric): Secondary | ICD-10-CM | POA: Diagnosis not present

## 2019-01-13 DIAGNOSIS — D500 Iron deficiency anemia secondary to blood loss (chronic): Secondary | ICD-10-CM | POA: Diagnosis not present

## 2019-01-13 DIAGNOSIS — I10 Essential (primary) hypertension: Secondary | ICD-10-CM | POA: Diagnosis not present

## 2019-01-13 DIAGNOSIS — E871 Hypo-osmolality and hyponatremia: Secondary | ICD-10-CM | POA: Diagnosis not present

## 2019-01-13 DIAGNOSIS — I4510 Unspecified right bundle-branch block: Secondary | ICD-10-CM | POA: Diagnosis not present

## 2019-01-14 LAB — COMPREHENSIVE METABOLIC PANEL
ALT: 25 IU/L (ref 0–32)
AST: 16 IU/L (ref 0–40)
Albumin/Globulin Ratio: 1.6 (ref 1.2–2.2)
Albumin: 3.9 g/dL (ref 3.7–4.7)
Alkaline Phosphatase: 75 IU/L (ref 39–117)
BUN/Creatinine Ratio: 27 (ref 12–28)
BUN: 20 mg/dL (ref 8–27)
Bilirubin Total: 0.2 mg/dL (ref 0.0–1.2)
CO2: 28 mmol/L (ref 20–29)
Calcium: 10.3 mg/dL (ref 8.7–10.3)
Chloride: 93 mmol/L — ABNORMAL LOW (ref 96–106)
Creatinine, Ser: 0.74 mg/dL (ref 0.57–1.00)
GFR calc Af Amer: 93 mL/min/{1.73_m2} (ref >59)
GFR calc non Af Amer: 81 mL/min/{1.73_m2} (ref >59)
Globulin, Total: 2.4 g/dL (ref 1.5–4.5)
Glucose: 199 mg/dL — ABNORMAL HIGH (ref 65–99)
Potassium: 4.2 mmol/L (ref 3.5–5.2)
Sodium: 136 mmol/L (ref 134–144)
Total Protein: 6.3 g/dL (ref 6.0–8.5)

## 2019-01-14 LAB — CBC
Hematocrit: 36.9 % (ref 34.0–46.6)
Hemoglobin: 12.4 g/dL (ref 11.1–15.9)
MCH: 29.2 pg (ref 26.6–33.0)
MCHC: 33.6 g/dL (ref 31.5–35.7)
MCV: 87 fL (ref 79–97)
Platelets: 307 10*3/uL (ref 150–450)
RBC: 4.24 x10E6/uL (ref 3.77–5.28)
RDW: 15.3 % (ref 11.7–15.4)
WBC: 13.7 10*3/uL — ABNORMAL HIGH (ref 3.4–10.8)

## 2019-01-17 ENCOUNTER — Ambulatory Visit: Payer: Medicare Other | Admitting: Podiatry

## 2019-01-19 ENCOUNTER — Ambulatory Visit: Payer: Medicare Other | Admitting: Podiatry

## 2019-01-20 ENCOUNTER — Ambulatory Visit: Payer: Medicare Other | Admitting: Podiatry

## 2019-02-02 ENCOUNTER — Other Ambulatory Visit: Payer: Self-pay

## 2019-02-02 ENCOUNTER — Encounter: Payer: Self-pay | Admitting: Podiatry

## 2019-02-02 ENCOUNTER — Ambulatory Visit (INDEPENDENT_AMBULATORY_CARE_PROVIDER_SITE_OTHER): Payer: Medicare Other | Admitting: Podiatry

## 2019-02-02 DIAGNOSIS — L4059 Other psoriatic arthropathy: Secondary | ICD-10-CM | POA: Diagnosis not present

## 2019-02-02 DIAGNOSIS — L97422 Non-pressure chronic ulcer of left heel and midfoot with fat layer exposed: Secondary | ICD-10-CM

## 2019-02-03 ENCOUNTER — Telehealth: Payer: Self-pay | Admitting: Podiatry

## 2019-02-03 MED ORDER — CLINDAMYCIN HCL 150 MG PO CAPS: 150.0000 mg | ORAL_CAPSULE | Freq: Two times a day (BID) | ORAL | 0 refills | Status: DC

## 2019-02-03 MED ORDER — MEDIHONEY WOUND/BURN DRESSING EX GEL: CUTANEOUS | 0 refills | Status: DC

## 2019-02-03 NOTE — Telephone Encounter (Signed)
I believe she was given a tube of medihoney yesterday to use.  If she needs more or didn't get can we order some for her? Medihoney gel

## 2019-02-03 NOTE — Telephone Encounter (Signed)
Pt was suppose to receive a anti-biotic and an ointment for her wound. She was seen yesterday and the pharmacy has not received it yet. Please call patient.

## 2019-02-03 NOTE — Telephone Encounter (Signed)
Spoke with patient she states she called again before I could return her call.  She did not receive the medi honey from the office before she left.  I confirmed that the Rx's were sent to her pharmacy and she will go pick them up

## 2019-02-03 NOTE — Telephone Encounter (Signed)
Dr March Rummage please advise

## 2019-02-06 ENCOUNTER — Telehealth: Payer: Self-pay

## 2019-02-06 DIAGNOSIS — L4050 Arthropathic psoriasis, unspecified: Secondary | ICD-10-CM | POA: Diagnosis not present

## 2019-02-06 DIAGNOSIS — G4733 Obstructive sleep apnea (adult) (pediatric): Secondary | ICD-10-CM | POA: Diagnosis not present

## 2019-02-06 DIAGNOSIS — D649 Anemia, unspecified: Secondary | ICD-10-CM | POA: Diagnosis not present

## 2019-02-06 DIAGNOSIS — I10 Essential (primary) hypertension: Secondary | ICD-10-CM | POA: Diagnosis not present

## 2019-02-06 DIAGNOSIS — R971 Elevated cancer antigen 125 [CA 125]: Secondary | ICD-10-CM | POA: Diagnosis not present

## 2019-02-06 DIAGNOSIS — E876 Hypokalemia: Secondary | ICD-10-CM | POA: Diagnosis not present

## 2019-02-06 DIAGNOSIS — F325 Major depressive disorder, single episode, in full remission: Secondary | ICD-10-CM | POA: Diagnosis not present

## 2019-02-06 DIAGNOSIS — E119 Type 2 diabetes mellitus without complications: Secondary | ICD-10-CM | POA: Diagnosis not present

## 2019-02-06 DIAGNOSIS — I82409 Acute embolism and thrombosis of unspecified deep veins of unspecified lower extremity: Secondary | ICD-10-CM | POA: Diagnosis not present

## 2019-02-06 NOTE — Telephone Encounter (Signed)
Was this call from today? This was handled on Friday

## 2019-02-06 NOTE — Progress Notes (Signed)
Subjective:  Patient ID: [name] [name], female    DOB: Dec 13, 1945,  MRN: [id number]  Chief Complaint  Patient presents with  . Wound Check    f/u on right foot pain, pt states that the wound is at the bottom of the left foot, blister has formed near the sore, pt puts pain as a 6 out of 30    73 y.o. female presents for wound care.  States that she was soaking the left foot and believe that she burned an area on the foot.  States the area blistered up because of that.  States that due to her neuropathy she could not tell the temperature of the water because the burn.   Review of Systems: Negative except as noted in the HPI. Denies N/V/F/Ch.  Past Medical History:  Diagnosis Date  . Arthritis   . Deep vein thrombosis ([place])    right calf - 05/2012   . Diabetes mellitus without complication (HCC)    diet controlled   . GERD (gastroesophageal reflux disease)   . Hyperlipidemia   . Hypertension    Ejection fraction =>55% Left ventricular systolic function is normal. Left ventricular wall motion is normal    . Lymphocytic colitis   . Neuropathy    diabetic - in bilateral feet   . Pityriasis lichenoides chronica   . Sleep apnea    bipap    Current Outpatient Medications:  .  allopurinol (ZYLOPRIM) 100 MG tablet, Take 100 mg by mouth 2 (two) times daily., Disp: , Rfl:  .  aspirin 81 MG tablet, Take 81 mg by mouth every other day. , Disp: , Rfl:  .  Aspirin Buf,CaCarb-MgCarb-MgO, 81 MG TABS, Take by mouth., Disp: , Rfl:  .  Budesonide (UCERIS) 9 MG TB24, Take 9 mg by mouth daily. , Disp: , Rfl:  .  cephALEXin (KEFLEX) 500 MG capsule, Take 1 capsule (500 mg total) by mouth 3 (three) times daily., Disp: 24 capsule, Rfl: 1 .  Certolizumab Pegol (CIMZIA [place]), Inject 400 mg into the skin every 30 (thirty) days. 200 mg on each side of stomach, Disp: , Rfl:  .  Cholecalciferol (VITAMIN D3) 1000 UNITS CAPS, Take 1,000 Units by mouth 2 (two) times daily. , Disp: , Rfl:  .  citalopram (CELEXA)  40 MG tablet, Take 40 mg by mouth daily., Disp: , Rfl:  .  Cyanocobalamin (VITAMIN B-12) 1000 MCG SUBL, Place 1,000 mcg under the tongue daily., Disp: , Rfl:  .  cyclobenzaprine (FLEXERIL) 10 MG tablet, Take 10 mg by mouth daily. , Disp: , Rfl:  .  Dietary Management Product (RHEUMATE) CAPS, Take 1 capsule by mouth daily., Disp: , Rfl: 0 .  doxycycline (VIBRA-TABS) 100 MG tablet, doxycycline hyclate 100 mg tabs, Disp: , Rfl:  .  furosemide (LASIX) 20 MG tablet, TAKE 1 TABLET BY MOUTH EVERY DAY AS NEEDED FOR SWELLING, Disp: 30 tablet, Rfl: 3 .  lisinopril-hydrochlorothiazide (PRINZIDE,ZESTORETIC) 20-12.5 MG per tablet, Take 1 tablet by mouth every morning., Disp: , Rfl:  .  loperamide (IMODIUM) 2 MG capsule, Take 6 mg by mouth 2 (two) times daily., Disp: , Rfl:  .  Melatonin 5 MG SUBL, Place 5 mg under the tongue at bedtime., Disp: , Rfl:  .  metFORMIN (GLUCOPHAGE) 500 MG tablet, Take 500 mg by mouth 2 (two) times daily with a meal., Disp: , Rfl:  .  methotrexate (RHEUMATREX) 2.5 MG tablet, Take 20 mg by mouth once a week. Caution: Chemotherapy. Protect from light. Takes  on Friday, Disp: , Rfl:  .  mupirocin cream (BACTROBAN) 2 %, Apply 1 application topically 2 (two) times daily as needed (psoriasis). , Disp: , Rfl:  .  omeprazole (PRILOSEC) 40 MG capsule, Take 40 mg by mouth at bedtime., Disp: , Rfl:  .  potassium chloride (K-DUR) 10 MEQ tablet, , Disp: , Rfl:  .  PRESCRIPTION MEDICATION, Place 3 drops into both ears as needed. Fluocinolone 0.01% Ear Drops. For inflammation and itching of the ear., Disp: , Rfl:  .  Probiotic Product (ALIGN PO), Take 1 capsule by mouth daily., Disp: , Rfl:  .  protein supplement shake (PREMIER PROTEIN) LIQD, Take 325 mLs (11 oz total) by mouth 2 (two) times daily between meals., Disp: 60 Can, Rfl: 0 .  clindamycin (CLEOCIN) 150 MG capsule, Take 1 capsule (150 mg total) by mouth 2 (two) times daily., Disp: 14 capsule, Rfl: 0 .  hydrochlorothiazide (MICROZIDE) 12.5  MG capsule, Take 1 capsule (12.5 mg total) by mouth as needed (swelling)., Disp: 30 capsule, Rfl: 3 .  Wound Dressings (MEDIHONEY WOUND/BURN DRESSING) GEL, Apply pea sized amount topically to wound daily, Disp: 14 mL, Rfl: 0  Social History   Tobacco Use  Smoking Status Never Smoker  Smokeless Tobacco Never Used    Allergies  Allergen Reactions  . Other Shortness Of Breath    Blue fish: palms and feet turn red  . Bee Venom Swelling  . Sulfa Antibiotics Hives   Objective:   Vitals:   02/02/19 1508  Temp: (!) 97.3 F (36.3 C)   There is no height or weight on file to calculate BMI. Constitutional Well developed. Well nourished.  Vascular Dorsalis pedis pulses palpable bilaterally. Posterior tibial pulses palpable bilaterally. Capillary refill normal to all digits.  No cyanosis or clubbing noted. Pedal hair growth normal.  Neurologic Normal speech. Oriented to person, place, and time. Protective sensation absent  Dermatologic Wound Location: Left midfoot Wound Base: Mixed Granular/Fibrotic Peri-wound: Reddened Exudate: Scant/small amount Serosanguinous exudate   Orthopedic: No pain to palpation either foot.   Radiographs: None Assessment:   1. Ulcer of heel and midfoot, left, with fat layer exposed ([place])    Plan:  Patient was evaluated and treated and all questions answered.  Ulcer left midfoot -Debridement as below. -Dressed with medihoney, DSD. -Continue off-loading. Discussed oflloading strategies.  Patient has full monitors of surgical shoe and walker boot.  Advised that any of these are better than normal shoe gear and my preference is that she wear a walking boot.  I discussed that her underlying deformity is related to Charcot and I do not believe that the wound is definitely better without some sort of control over the biomechanical deformity. -Rx clindamycin -Order medihoney to be used on wound -D/c soaking. I am concerned given neuropathy she is at risk  of further burning herself.  Procedure: Excisional Debridement of Wound Rationale: Removal of non-viable soft tissue from the wound to promote healing.  Anesthesia: none Pre-Debridement Wound Measurements: 1.5 cm x 1 cm x 0.2 cm  Post-Debridement Wound Measurements: 2 cm x 1.5 cm x 0.2 cm  Type of Debridement: Sharp Excisional Tissue Removed: Non-viable soft tissue Depth of Debridement: subcutaneous tissue. Technique: Sharp excisional debridement to bleeding, viable wound base.  Dressing: Dry, sterile, compression dressing. Disposition: Patient tolerated procedure well. Patient to return in 1 week for follow-up.    Return in about 1 week (around 02/09/2019) for wound care.

## 2019-02-06 NOTE — Telephone Encounter (Signed)
Patient calling stating that the antibiotic and ointment was not sent to her pharmacy.   Pharmacy: [facility]

## 2019-02-09 ENCOUNTER — Other Ambulatory Visit: Payer: Self-pay

## 2019-02-09 ENCOUNTER — Ambulatory Visit (INDEPENDENT_AMBULATORY_CARE_PROVIDER_SITE_OTHER): Payer: Medicare Other | Admitting: Podiatry

## 2019-02-09 VITALS — Temp 96.7°F

## 2019-02-09 DIAGNOSIS — L97422 Non-pressure chronic ulcer of left heel and midfoot with fat layer exposed: Secondary | ICD-10-CM | POA: Diagnosis not present

## 2019-02-09 NOTE — Progress Notes (Signed)
Subjective:  Patient ID: [name] [name], female    DOB: [birth year],  MRN: [id number]  Chief Complaint  Patient presents with  . Wound Check    Pt states left plantar wound has less pain and feels as though it has improvement. Pt has been taking clindamycin as prescribed.    73 y.o. female presents for wound care.  Thinks the wound is doing better not hurting as much.  Review of Systems: Negative except as noted in the HPI. Denies N/V/F/Ch.  Past Medical History:  Diagnosis Date  . Arthritis   . Deep vein thrombosis (HCC)    right calf - 05/2012   . Diabetes mellitus without complication (HCC)    diet controlled   . GERD (gastroesophageal reflux disease)   . Hyperlipidemia   . Hypertension    Ejection fraction =>55% Left ventricular systolic function is normal. Left ventricular wall motion is normal    . Lymphocytic colitis   . Neuropathy    diabetic - in bilateral feet   . Pityriasis lichenoides chronica   . Sleep apnea    bipap    Current Outpatient Medications:  .  allopurinol (ZYLOPRIM) 100 MG tablet, Take 100 mg by mouth 2 (two) times daily., Disp: , Rfl:  .  aspirin 81 MG tablet, Take 81 mg by mouth every other day. , Disp: , Rfl:  .  Aspirin Buf,CaCarb-MgCarb-MgO, 81 MG TABS, Take by mouth., Disp: , Rfl:  .  Budesonide (UCERIS) 9 MG TB24, Take 9 mg by mouth daily. , Disp: , Rfl:  .  cephALEXin (KEFLEX) 500 MG capsule, Take 1 capsule (500 mg total) by mouth 3 (three) times daily., Disp: 24 capsule, Rfl: 1 .  Certolizumab Pegol (CIMZIA [place]), Inject 400 mg into the skin every 30 (thirty) days. 200 mg on each side of stomach, Disp: , Rfl:  .  Cholecalciferol (VITAMIN D3) 1000 UNITS CAPS, Take 1,000 Units by mouth 2 (two) times daily. , Disp: , Rfl:  .  citalopram (CELEXA) 40 MG tablet, Take 40 mg by mouth daily., Disp: , Rfl:  .  clindamycin (CLEOCIN) 150 MG capsule, Take 1 capsule (150 mg total) by mouth 2 (two) times daily., Disp: 14 capsule, Rfl: 0 .  Cyanocobalamin  (VITAMIN B-12) 1000 MCG SUBL, Place 1,000 mcg under the tongue daily., Disp: , Rfl:  .  cyclobenzaprine (FLEXERIL) 10 MG tablet, Take 10 mg by mouth daily. , Disp: , Rfl:  .  Dietary Management Product (RHEUMATE) CAPS, Take 1 capsule by mouth daily., Disp: , Rfl: 0 .  doxycycline (VIBRA-TABS) 100 MG tablet, doxycycline hyclate 100 mg tabs, Disp: , Rfl:  .  furosemide (LASIX) 20 MG tablet, TAKE 1 TABLET BY MOUTH EVERY DAY AS NEEDED FOR SWELLING, Disp: 30 tablet, Rfl: 3 .  lisinopril-hydrochlorothiazide (PRINZIDE,ZESTORETIC) 20-12.5 MG per tablet, Take 1 tablet by mouth every morning., Disp: , Rfl:  .  loperamide (IMODIUM) 2 MG capsule, Take 6 mg by mouth 2 (two) times daily., Disp: , Rfl:  .  Melatonin 5 MG SUBL, Place 5 mg under the tongue at bedtime., Disp: , Rfl:  .  metFORMIN (GLUCOPHAGE) 500 MG tablet, Take 500 mg by mouth 2 (two) times daily with a meal., Disp: , Rfl:  .  methotrexate (RHEUMATREX) 2.5 MG tablet, Take 20 mg by mouth once a week. Caution: Chemotherapy. Protect from light. Takes on Friday, Disp: , Rfl:  .  mupirocin cream (BACTROBAN) 2 %, Apply 1 application topically 2 (two) times daily as needed (psoriasis). ,  Disp: , Rfl:  .  omeprazole (PRILOSEC) 40 MG capsule, Take 40 mg by mouth at bedtime., Disp: , Rfl:  .  potassium chloride (K-DUR) 10 MEQ tablet, , Disp: , Rfl:  .  PRESCRIPTION MEDICATION, Place 3 drops into both ears as needed. Fluocinolone 0.01% Ear Drops. For inflammation and itching of the ear., Disp: , Rfl:  .  Probiotic Product (ALIGN PO), Take 1 capsule by mouth daily., Disp: , Rfl:  .  protein supplement shake (PREMIER PROTEIN) LIQD, Take 325 mLs (11 oz total) by mouth 2 (two) times daily between meals., Disp: 60 Can, Rfl: 0 .  Wound Dressings (MEDIHONEY WOUND/BURN DRESSING) GEL, Apply pea sized amount topically to wound daily, Disp: 14 mL, Rfl: 0 .  hydrochlorothiazide (MICROZIDE) 12.5 MG capsule, Take 1 capsule (12.5 mg total) by mouth as needed (swelling).,  Disp: 30 capsule, Rfl: 3  Social History   Tobacco Use  Smoking Status Never Smoker  Smokeless Tobacco Never Used    Allergies  Allergen Reactions  . Other Shortness Of Breath    Blue fish: palms and feet turn red  . Bee Venom Swelling  . Sulfa Antibiotics Hives   Objective:   Vitals:   02/09/19 0912  Temp: (!) 96.7 F (35.9 C)   There is no height or weight on file to calculate BMI. Constitutional Well developed. Well nourished.  Vascular Dorsalis pedis pulses palpable bilaterally. Posterior tibial pulses palpable bilaterally. Capillary refill normal to all digits.  No cyanosis or clubbing noted. Pedal hair growth normal.  Neurologic Normal speech. Oriented to person, place, and time. Protective sensation absent  Dermatologic Wound Location: Left midfoot Wound Base: granular Peri-wound: intact, no warmth Exudate: Scant/small amount Serosanguinous exudate  Wound base measuring 1x1 today  Orthopedic: No pain to palpation either foot.   Radiographs: None Assessment:   1. Ulcer of heel and midfoot, left, with fat layer exposed (HCC)    Plan:  Patient was evaluated and treated and all questions answered.  Ulcer left midfoot -Improved. Continue abx until completion. -Continue dressing with medihoney -Continue offloading surgical shoe.  Procedure: Excisional Debridement of Wound Rationale: Removal of non-viable soft tissue from the wound to promote healing.  Anesthesia: none Pre-Debridement Wound Measurements:  0.8 cm x 1 cm x 0.2 cm  Post-Debridement Wound Measurements: 1 cm x 1.2 cm x 0.2 cm  Type of Debridement: Sharp Excisional Tissue Removed: Non-viable soft tissue Depth of Debridement: subcutaneous tissue. Technique: Sharp excisional debridement to bleeding, viable wound base.  Dressing: Dry, sterile, compression dressing. Disposition: Patient tolerated procedure well. Patient to return in 1 week for follow-up.     No follow-ups on file.

## 2019-02-16 ENCOUNTER — Other Ambulatory Visit: Payer: Self-pay

## 2019-02-16 ENCOUNTER — Ambulatory Visit (INDEPENDENT_AMBULATORY_CARE_PROVIDER_SITE_OTHER): Payer: Medicare Other | Admitting: Podiatry

## 2019-02-16 DIAGNOSIS — L97422 Non-pressure chronic ulcer of left heel and midfoot with fat layer exposed: Secondary | ICD-10-CM | POA: Diagnosis not present

## 2019-02-16 DIAGNOSIS — M898X7 Other specified disorders of bone, ankle and foot: Secondary | ICD-10-CM | POA: Diagnosis not present

## 2019-02-16 NOTE — Progress Notes (Signed)
Subjective:  Patient ID: [name] [name], female    DOB: [birth year],  MRN: [id number]  Chief Complaint  Patient presents with  . Foot Ulcer    left foot ulcer f/u, pt states that she is doing a lot better since the last time she came in, pt states that the surgical shoe has been helping as well    74 y.o. female presents for wound care.  Hx as above doing well denies complaints. States she has been off the wound more.  Review of Systems: Negative except as noted in the HPI. Denies N/V/F/Ch.  Past Medical History:  Diagnosis Date  . Arthritis   . Deep vein thrombosis ([place])    right calf - 05/2012   . Diabetes mellitus without complication (HCC)    diet controlled   . GERD (gastroesophageal reflux disease)   . Hyperlipidemia   . Hypertension    Ejection fraction =>55% Left ventricular systolic function is normal. Left ventricular wall motion is normal    . Lymphocytic colitis   . Neuropathy    diabetic - in bilateral feet   . Pityriasis lichenoides chronica   . Sleep apnea    bipap    Current Outpatient Medications:  .  allopurinol (ZYLOPRIM) 100 MG tablet, Take 100 mg by mouth 2 (two) times daily., Disp: , Rfl:  .  aspirin 81 MG tablet, Take 81 mg by mouth every other day. , Disp: , Rfl:  .  Aspirin Buf,CaCarb-MgCarb-MgO, 81 MG TABS, Take by mouth., Disp: , Rfl:  .  Budesonide (UCERIS) 9 MG TB24, Take 9 mg by mouth daily. , Disp: , Rfl:  .  cephALEXin (KEFLEX) 500 MG capsule, Take 1 capsule (500 mg total) by mouth 3 (three) times daily., Disp: 24 capsule, Rfl: 1 .  Certolizumab Pegol (CIMZIA [place]), Inject 400 mg into the skin every 30 (thirty) days. 200 mg on each side of stomach, Disp: , Rfl:  .  Cholecalciferol (VITAMIN D3) 1000 UNITS CAPS, Take 1,000 Units by mouth 2 (two) times daily. , Disp: , Rfl:  .  citalopram (CELEXA) 40 MG tablet, Take 40 mg by mouth daily., Disp: , Rfl:  .  clindamycin (CLEOCIN) 150 MG capsule, Take 1 capsule (150 mg total) by mouth 2 (two) times  daily., Disp: 14 capsule, Rfl: 0 .  Cyanocobalamin (VITAMIN B-12) 1000 MCG SUBL, Place 1,000 mcg under the tongue daily., Disp: , Rfl:  .  cyclobenzaprine (FLEXERIL) 10 MG tablet, Take 10 mg by mouth daily. , Disp: , Rfl:  .  Dietary Management Product (RHEUMATE) CAPS, Take 1 capsule by mouth daily., Disp: , Rfl: 0 .  doxycycline (VIBRA-TABS) 100 MG tablet, doxycycline hyclate 100 mg tabs, Disp: , Rfl:  .  furosemide (LASIX) 20 MG tablet, TAKE 1 TABLET BY MOUTH EVERY DAY AS NEEDED FOR SWELLING, Disp: 30 tablet, Rfl: 3 .  lisinopril-hydrochlorothiazide (PRINZIDE,ZESTORETIC) 20-12.5 MG per tablet, Take 1 tablet by mouth every morning., Disp: , Rfl:  .  loperamide (IMODIUM) 2 MG capsule, Take 6 mg by mouth 2 (two) times daily., Disp: , Rfl:  .  Melatonin 5 MG SUBL, Place 5 mg under the tongue at bedtime., Disp: , Rfl:  .  metFORMIN (GLUCOPHAGE) 500 MG tablet, Take 500 mg by mouth 2 (two) times daily with a meal., Disp: , Rfl:  .  metFORMIN (GLUCOPHAGE-XR) 500 MG 24 hr tablet, , Disp: , Rfl:  .  methotrexate (RHEUMATREX) 2.5 MG tablet, Take 20 mg by mouth once a week. Caution: Chemotherapy.  Protect from light. Takes on Friday, Disp: , Rfl:  .  mupirocin cream (BACTROBAN) 2 %, Apply 1 application topically 2 (two) times daily as needed (psoriasis). , Disp: , Rfl:  .  omeprazole (PRILOSEC) 40 MG capsule, Take 40 mg by mouth at bedtime., Disp: , Rfl:  .  potassium chloride (K-DUR) 10 MEQ tablet, , Disp: , Rfl:  .  PRESCRIPTION MEDICATION, Place 3 drops into both ears as needed. Fluocinolone 0.01% Ear Drops. For inflammation and itching of the ear., Disp: , Rfl:  .  Probiotic Product (ALIGN PO), Take 1 capsule by mouth daily., Disp: , Rfl:  .  protein supplement shake (PREMIER PROTEIN) LIQD, Take 325 mLs (11 oz total) by mouth 2 (two) times daily between meals., Disp: 60 Can, Rfl: 0 .  Wound Dressings (MEDIHONEY WOUND/BURN DRESSING) GEL, Apply pea sized amount topically to wound daily, Disp: 14 mL, Rfl: 0  .  hydrochlorothiazide (MICROZIDE) 12.5 MG capsule, Take 1 capsule (12.5 mg total) by mouth as needed (swelling)., Disp: 30 capsule, Rfl: 3  Social History   Tobacco Use  Smoking Status Never Smoker  Smokeless Tobacco Never Used    Allergies  Allergen Reactions  . Other Shortness Of Breath    Blue fish: palms and feet turn red  . Bee Venom Swelling  . Sulfa Antibiotics Hives   Objective:   There were no vitals filed for this visit. There is no height or weight on file to calculate BMI. Constitutional Well developed. Well nourished.  Vascular Dorsalis pedis pulses palpable bilaterally. Posterior tibial pulses palpable bilaterally. Capillary refill normal to all digits.  No cyanosis or clubbing noted. Pedal hair growth normal.  Neurologic Normal speech. Oriented to person, place, and time. Protective sensation absent  Dermatologic Wound Location: Left midfoot Wound Base: granular Peri-wound: intact, no warmth Exudate: Scant/small amount Serosanguinous exudate  Wound base measuring 1x1 today  Orthopedic: No pain to palpation either foot.   Radiographs: None Assessment:   No diagnosis found. Plan:  Patient was evaluated and treated and all questions answered.  Ulcer left midfoot -Continues to improve. Local redness noted but appears to be irritation / previous burn rather than infection. Advised should this worsen to call and we can prescribe abx therapy.  Procedure: Excisional Debridement of Wound Rationale: Removal of non-viable soft tissue from the wound to promote healing.  Anesthesia: none Pre-Debridement Wound Measurements: 1 cm x 1 cm x 0.2 cm  Post-Debridement Wound Measurements: 1.3 cm x 1.2 cm x 0.2 cm  Type of Debridement: Sharp Excisional Tissue Removed: Non-viable soft tissue Depth of Debridement: subcutaneous tissue. Technique: Sharp excisional debridement to bleeding, viable wound base.  Dressing: Dry, sterile, compression dressing. Disposition:  Patient tolerated procedure well. Patient to return in 1 week for follow-up.  Midfoot Exostosis with Charcot Deformity -Discussed possible osseous planing should ulceration continue in attempt to heal the ulceration. Disccussed r/b/ of procedure. Will defer at this time.    Return in about 2 weeks (around 03/02/2019) for Wound Care, Left.

## 2019-02-23 ENCOUNTER — Ambulatory Visit (INDEPENDENT_AMBULATORY_CARE_PROVIDER_SITE_OTHER): Payer: Medicare Other

## 2019-02-23 ENCOUNTER — Ambulatory Visit (INDEPENDENT_AMBULATORY_CARE_PROVIDER_SITE_OTHER): Payer: Medicare Other | Admitting: Podiatry

## 2019-02-23 ENCOUNTER — Other Ambulatory Visit: Payer: Self-pay

## 2019-02-23 ENCOUNTER — Encounter: Payer: Self-pay | Admitting: Podiatry

## 2019-02-23 ENCOUNTER — Telehealth: Payer: Self-pay | Admitting: *Deleted

## 2019-02-23 VITALS — Temp 98.7°F

## 2019-02-23 DIAGNOSIS — L97422 Non-pressure chronic ulcer of left heel and midfoot with fat layer exposed: Secondary | ICD-10-CM

## 2019-02-23 DIAGNOSIS — L03119 Cellulitis of unspecified part of limb: Secondary | ICD-10-CM

## 2019-02-23 DIAGNOSIS — L02619 Cutaneous abscess of unspecified foot: Secondary | ICD-10-CM

## 2019-02-23 MED ORDER — CLINDAMYCIN HCL 300 MG PO CAPS: 300.0000 mg | ORAL_CAPSULE | Freq: Two times a day (BID) | ORAL | 0 refills | Status: DC

## 2019-02-23 NOTE — Telephone Encounter (Signed)
Was seen today .

## 2019-02-23 NOTE — Telephone Encounter (Signed)
Left message informing pt [name] wanted to see her since she was having more redness and discomfort to call our office for an appt.

## 2019-02-23 NOTE — Telephone Encounter (Signed)
Pt called states it is hard for her to see her foot, but her husband states it is more red around the area and more uncomfortable to step on.

## 2019-02-23 NOTE — Telephone Encounter (Signed)
I called pt and explained to pt the necessity for her to be seen, pt stated understanding and I transferred to schedulers.

## 2019-02-23 NOTE — Telephone Encounter (Signed)
Pt called states she is seeing [name] weekly for a burn to her foot and she is skipping this week, but would like a

## 2019-02-23 NOTE — Telephone Encounter (Signed)
Left message requesting a call back to discuss the status of the wound per [name] request.

## 2019-03-02 ENCOUNTER — Ambulatory Visit (INDEPENDENT_AMBULATORY_CARE_PROVIDER_SITE_OTHER): Payer: Medicare Other | Admitting: Podiatry

## 2019-03-02 ENCOUNTER — Other Ambulatory Visit: Payer: Self-pay

## 2019-03-02 VITALS — Temp 97.8°F

## 2019-03-02 DIAGNOSIS — L4059 Other psoriatic arthropathy: Secondary | ICD-10-CM | POA: Diagnosis not present

## 2019-03-02 DIAGNOSIS — M2142 Flat foot [pes planus] (acquired), left foot: Secondary | ICD-10-CM | POA: Diagnosis not present

## 2019-03-02 DIAGNOSIS — M898X7 Other specified disorders of bone, ankle and foot: Secondary | ICD-10-CM

## 2019-03-02 DIAGNOSIS — L97422 Non-pressure chronic ulcer of left heel and midfoot with fat layer exposed: Secondary | ICD-10-CM

## 2019-03-02 DIAGNOSIS — M14672 Charcot's joint, left ankle and foot: Secondary | ICD-10-CM | POA: Diagnosis not present

## 2019-03-02 DIAGNOSIS — M545 Low back pain: Secondary | ICD-10-CM | POA: Diagnosis not present

## 2019-03-02 DIAGNOSIS — K5289 Other specified noninfective gastroenteritis and colitis: Secondary | ICD-10-CM | POA: Diagnosis not present

## 2019-03-02 DIAGNOSIS — Z79899 Other long term (current) drug therapy: Secondary | ICD-10-CM | POA: Diagnosis not present

## 2019-03-02 DIAGNOSIS — M2141 Flat foot [pes planus] (acquired), right foot: Secondary | ICD-10-CM | POA: Diagnosis not present

## 2019-03-02 DIAGNOSIS — M2550 Pain in unspecified joint: Secondary | ICD-10-CM | POA: Diagnosis not present

## 2019-03-02 DIAGNOSIS — L408 Other psoriasis: Secondary | ICD-10-CM | POA: Diagnosis not present

## 2019-03-02 DIAGNOSIS — M1009 Idiopathic gout, multiple sites: Secondary | ICD-10-CM | POA: Diagnosis not present

## 2019-03-02 NOTE — Progress Notes (Signed)
Subjective:  Patient ID: [name] [name], female    DOB: [birth year],  MRN: [id number]  Chief Complaint  Patient presents with  . Wound Check    wound care on the left foot, pt states that she is in pain, and is looking to get some antibiotics    73 y.o. female presents for wound care.  Thinks the wound has a little redness and is concerned that she needs antibiotics.  Review of Systems: Negative except as noted in the HPI. Denies N/V/F/Ch.  Past Medical History:  Diagnosis Date  . Arthritis   . Deep vein thrombosis ([place])    right calf - 05/2012   . Diabetes mellitus without complication (HCC)    diet controlled   . GERD (gastroesophageal reflux disease)   . Hyperlipidemia   . Hypertension    Ejection fraction =>55% Left ventricular systolic function is normal. Left ventricular wall motion is normal    . Lymphocytic colitis   . Neuropathy    diabetic - in bilateral feet   . Pityriasis lichenoides chronica   . Sleep apnea    bipap    Current Outpatient Medications:  .  allopurinol (ZYLOPRIM) 100 MG tablet, Take 100 mg by mouth 2 (two) times daily., Disp: , Rfl:  .  aspirin 81 MG tablet, Take 81 mg by mouth every other day. , Disp: , Rfl:  .  Aspirin Buf,CaCarb-MgCarb-MgO, 81 MG TABS, Take by mouth., Disp: , Rfl:  .  Budesonide (UCERIS) 9 MG TB24, Take 9 mg by mouth daily. , Disp: , Rfl:  .  cephALEXin (KEFLEX) 500 MG capsule, Take 1 capsule (500 mg total) by mouth 3 (three) times daily., Disp: 24 capsule, Rfl: 1 .  Certolizumab Pegol (CIMZIA [place]), Inject 400 mg into the skin every 30 (thirty) days. 200 mg on each side of stomach, Disp: , Rfl:  .  Cholecalciferol (VITAMIN D3) 1000 UNITS CAPS, Take 1,000 Units by mouth 2 (two) times daily. , Disp: , Rfl:  .  citalopram (CELEXA) 40 MG tablet, Take 40 mg by mouth daily., Disp: , Rfl:  .  clindamycin (CLEOCIN) 300 MG capsule, Take 1 capsule (300 mg total) by mouth 2 (two) times daily., Disp: 14 capsule, Rfl: 0 .  Cyanocobalamin  (VITAMIN B-12) 1000 MCG SUBL, Place 1,000 mcg under the tongue daily., Disp: , Rfl:  .  cyclobenzaprine (FLEXERIL) 10 MG tablet, Take 10 mg by mouth daily. , Disp: , Rfl:  .  Dietary Management Product (RHEUMATE) CAPS, Take 1 capsule by mouth daily., Disp: , Rfl: 0 .  doxycycline (VIBRA-TABS) 100 MG tablet, doxycycline hyclate 100 mg tabs, Disp: , Rfl:  .  furosemide (LASIX) 20 MG tablet, TAKE 1 TABLET BY MOUTH EVERY DAY AS NEEDED FOR SWELLING, Disp: 30 tablet, Rfl: 3 .  lisinopril-hydrochlorothiazide (PRINZIDE,ZESTORETIC) 20-12.5 MG per tablet, Take 1 tablet by mouth every morning., Disp: , Rfl:  .  loperamide (IMODIUM) 2 MG capsule, Take 6 mg by mouth 2 (two) times daily., Disp: , Rfl:  .  Melatonin 5 MG SUBL, Place 5 mg under the tongue at bedtime., Disp: , Rfl:  .  metFORMIN (GLUCOPHAGE) 500 MG tablet, Take 500 mg by mouth 2 (two) times daily with a meal., Disp: , Rfl:  .  metFORMIN (GLUCOPHAGE-XR) 500 MG 24 hr tablet, , Disp: , Rfl:  .  methotrexate (RHEUMATREX) 2.5 MG tablet, Take 20 mg by mouth once a week. Caution: Chemotherapy. Protect from light. Takes on Friday, Disp: , Rfl:  .  mupirocin cream (BACTROBAN) 2 %, Apply 1 application topically 2 (two) times daily as needed (psoriasis). , Disp: , Rfl:  .  omeprazole (PRILOSEC) 40 MG capsule, Take 40 mg by mouth at bedtime., Disp: , Rfl:  .  potassium chloride (K-DUR) 10 MEQ tablet, , Disp: , Rfl:  .  PRESCRIPTION MEDICATION, Place 3 drops into both ears as needed. Fluocinolone 0.01% Ear Drops. For inflammation and itching of the ear., Disp: , Rfl:  .  Probiotic Product (ALIGN PO), Take 1 capsule by mouth daily., Disp: , Rfl:  .  protein supplement shake (PREMIER PROTEIN) LIQD, Take 325 mLs (11 oz total) by mouth 2 (two) times daily between meals., Disp: 60 Can, Rfl: 0 .  Wound Dressings (MEDIHONEY WOUND/BURN DRESSING) GEL, Apply pea sized amount topically to wound daily, Disp: 14 mL, Rfl: 0 .  hydrochlorothiazide (MICROZIDE) 12.5 MG capsule,  Take 1 capsule (12.5 mg total) by mouth as needed (swelling)., Disp: 30 capsule, Rfl: 3  Social History   Tobacco Use  Smoking Status Never Smoker  Smokeless Tobacco Never Used    Allergies  Allergen Reactions  . Other Shortness Of Breath    Blue fish: palms and feet turn red  . Bee Venom Swelling  . Sulfa Antibiotics Hives   Objective:   Vitals:   02/23/19 1511  Temp: 98.7 F (37.1 C)   There is no height or weight on file to calculate BMI. Constitutional Well developed. Well nourished.  Vascular Dorsalis pedis pulses palpable bilaterally. Posterior tibial pulses palpable bilaterally. Capillary refill normal to all digits.  No cyanosis or clubbing noted. Pedal hair growth normal.  Neurologic Normal speech. Oriented to person, place, and time. Protective sensation absent  Dermatologic Wound Location: Left midfoot Wound Base: granular Peri-wound: Slight periwound warmth and erythema Exudate: Scant/small amount Serosanguinous exudate  Wound base measuring 1x1 today  Orthopedic: No pain to palpation either foot.   Radiographs: None Assessment:   1. Ulcer of heel and midfoot, left, with fat layer exposed ([place])   2. Cellulitis and abscess of foot, except toes    Plan:  Patient was evaluated and treated and all questions answered.  Ulcer left midfoot -Local warmth slight redness noted on exam today no deep probing no signs of acute deep infection.  Will refill antibiotics.  Follow-up next week for recheck  No follow-ups on file.

## 2019-03-03 DIAGNOSIS — Z20828 Contact with and (suspected) exposure to other viral communicable diseases: Secondary | ICD-10-CM | POA: Diagnosis not present

## 2019-03-05 NOTE — Progress Notes (Signed)
Subjective:  Patient ID: [name] [name], female    DOB: Aug 13, 1945,  MRN: [id number]  Chief Complaint  Patient presents with  . Wound Check    Pt states wound is the same, slight blood drainage, no pus. Pt denies fever/nausea/vomiting/chills. Pt states she has no concerns.    73 y.o. female presents for wound care.  Thinks the wound is looking a little better decreased warmth and redness since taking antibiotic.  Review of Systems: Negative except as noted in the HPI. Denies N/V/F/Ch.  Past Medical History:  Diagnosis Date  . Arthritis   . Deep vein thrombosis ([place])    right calf - 05/2012   . Diabetes mellitus without complication (HCC)    diet controlled   . GERD (gastroesophageal reflux disease)   . Hyperlipidemia   . Hypertension    Ejection fraction =>55% Left ventricular systolic function is normal. Left ventricular wall motion is normal    . Lymphocytic colitis   . Neuropathy    diabetic - in bilateral feet   . Pityriasis lichenoides chronica   . Sleep apnea    bipap    Current Outpatient Medications:  .  allopurinol (ZYLOPRIM) 100 MG tablet, Take 100 mg by mouth 2 (two) times daily., Disp: , Rfl:  .  aspirin 81 MG tablet, Take 81 mg by mouth every other day. , Disp: , Rfl:  .  Aspirin Buf,CaCarb-MgCarb-MgO, 81 MG TABS, Take by mouth., Disp: , Rfl:  .  Budesonide (UCERIS) 9 MG TB24, Take 9 mg by mouth daily. , Disp: , Rfl:  .  cephALEXin (KEFLEX) 500 MG capsule, Take 1 capsule (500 mg total) by mouth 3 (three) times daily., Disp: 24 capsule, Rfl: 1 .  Certolizumab Pegol (CIMZIA [place]), Inject 400 mg into the skin every 30 (thirty) days. 200 mg on each side of stomach, Disp: , Rfl:  .  Cholecalciferol (VITAMIN D3) 1000 UNITS CAPS, Take 1,000 Units by mouth 2 (two) times daily. , Disp: , Rfl:  .  citalopram (CELEXA) 40 MG tablet, Take 40 mg by mouth daily., Disp: , Rfl:  .  clindamycin (CLEOCIN) 300 MG capsule, Take 1 capsule (300 mg total) by mouth 2 (two) times daily.,  Disp: 14 capsule, Rfl: 0 .  Cyanocobalamin (VITAMIN B-12) 1000 MCG SUBL, Place 1,000 mcg under the tongue daily., Disp: , Rfl:  .  cyclobenzaprine (FLEXERIL) 10 MG tablet, Take 10 mg by mouth daily. , Disp: , Rfl:  .  Dietary Management Product (RHEUMATE) CAPS, Take 1 capsule by mouth daily., Disp: , Rfl: 0 .  doxycycline (VIBRA-TABS) 100 MG tablet, doxycycline hyclate 100 mg tabs, Disp: , Rfl:  .  furosemide (LASIX) 20 MG tablet, TAKE 1 TABLET BY MOUTH EVERY DAY AS NEEDED FOR SWELLING, Disp: 30 tablet, Rfl: 3 .  lisinopril-hydrochlorothiazide (PRINZIDE,ZESTORETIC) 20-12.5 MG per tablet, Take 1 tablet by mouth every morning., Disp: , Rfl:  .  loperamide (IMODIUM) 2 MG capsule, Take 6 mg by mouth 2 (two) times daily., Disp: , Rfl:  .  Melatonin 5 MG SUBL, Place 5 mg under the tongue at bedtime., Disp: , Rfl:  .  metFORMIN (GLUCOPHAGE) 500 MG tablet, Take 500 mg by mouth 2 (two) times daily with a meal., Disp: , Rfl:  .  metFORMIN (GLUCOPHAGE-XR) 500 MG 24 hr tablet, , Disp: , Rfl:  .  methotrexate (RHEUMATREX) 2.5 MG tablet, Take 20 mg by mouth once a week. Caution: Chemotherapy. Protect from light. Takes on Friday, Disp: , Rfl:  .  mupirocin cream (BACTROBAN) 2 %, Apply 1 application topically 2 (two) times daily as needed (psoriasis). , Disp: , Rfl:  .  omeprazole (PRILOSEC) 40 MG capsule, Take 40 mg by mouth at bedtime., Disp: , Rfl:  .  potassium chloride (K-DUR) 10 MEQ tablet, , Disp: , Rfl:  .  PRESCRIPTION MEDICATION, Place 3 drops into both ears as needed. Fluocinolone 0.01% Ear Drops. For inflammation and itching of the ear., Disp: , Rfl:  .  Probiotic Product (ALIGN PO), Take 1 capsule by mouth daily., Disp: , Rfl:  .  protein supplement shake (PREMIER PROTEIN) LIQD, Take 325 mLs (11 oz total) by mouth 2 (two) times daily between meals., Disp: 60 Can, Rfl: 0 .  Wound Dressings (MEDIHONEY WOUND/BURN DRESSING) GEL, Apply pea sized amount topically to wound daily, Disp: 14 mL, Rfl: 0 .   hydrochlorothiazide (MICROZIDE) 12.5 MG capsule, Take 1 capsule (12.5 mg total) by mouth as needed (swelling)., Disp: 30 capsule, Rfl: 3  Social History   Tobacco Use  Smoking Status Never Smoker  Smokeless Tobacco Never Used    Allergies  Allergen Reactions  . Other Shortness Of Breath    Blue fish: palms and feet turn red  . Bee Venom Swelling  . Sulfa Antibiotics Hives   Objective:   Vitals:   03/02/19 1402  Temp: 97.8 F (36.6 C)   There is no height or weight on file to calculate BMI. Constitutional Well developed. Well nourished.  Vascular Dorsalis pedis pulses palpable bilaterally. Posterior tibial pulses palpable bilaterally. Capillary refill normal to all digits.  No cyanosis or clubbing noted. Pedal hair growth normal.  Neurologic Normal speech. Oriented to person, place, and time. Protective sensation absent  Dermatologic Wound Location: Left midfoot Wound Base: granular Peri-wound: intact Exudate: Scant/small amount Serosanguinous exudate  Wound base measuring 1x0.5 today  Orthopedic: No pain to palpation either foot.   Radiographs: None Assessment:   No diagnosis found. Plan:  Patient was evaluated and treated and all questions answered.  Ulcer left midfoot -Local warmth and redness resolved.  Ulcer appears to be improving.  Wound debrided as below -Discussed that should the ulcer continue would consider possible excision of the bony exostosis contributing to the ulceration.  Patient verbalized understanding without hold off this time but we will have this discussion again in the coming weeks  Procedure: Selective Debridement of Wound Rationale: Removal of devitalized tissue from the wound to promote healing.  Pre-Debridement Wound Measurements: 1 cm x 0.5 cm x 0.3 cm  Post-Debridement Wound Measurements: same as pre-debridement. Type of Debridement: sharp selective Tissue Removed: Devitalized soft-tissue Dressing: Dry, sterile, compression  dressing. Disposition: Patient tolerated procedure well. Patient to return in 1 week for follow-up.    No follow-ups on file.

## 2019-03-10 DIAGNOSIS — Z79899 Other long term (current) drug therapy: Secondary | ICD-10-CM | POA: Diagnosis not present

## 2019-03-10 DIAGNOSIS — L4059 Other psoriatic arthropathy: Secondary | ICD-10-CM | POA: Diagnosis not present

## 2019-03-16 ENCOUNTER — Ambulatory Visit (INDEPENDENT_AMBULATORY_CARE_PROVIDER_SITE_OTHER): Payer: Medicare Other | Admitting: Podiatry

## 2019-03-16 ENCOUNTER — Other Ambulatory Visit: Payer: Self-pay

## 2019-03-16 DIAGNOSIS — I739 Peripheral vascular disease, unspecified: Secondary | ICD-10-CM

## 2019-03-16 DIAGNOSIS — L97422 Non-pressure chronic ulcer of left heel and midfoot with fat layer exposed: Secondary | ICD-10-CM | POA: Diagnosis not present

## 2019-03-16 MED ORDER — CLINDAMYCIN HCL 300 MG PO CAPS: 300.0000 mg | ORAL_CAPSULE | Freq: Two times a day (BID) | ORAL | 0 refills | Status: DC

## 2019-03-16 NOTE — Progress Notes (Addendum)
Subjective:  Patient ID: [name] [name], female    DOB: 01-Feb-1946,  MRN: [id number]  No chief complaint on file.   73 y.o. female presents for wound care. Having more redness at the area. States she put more pressure on it last night getting out of bed.  Review of Systems: Negative except as noted in the HPI. Denies N/V/F/Ch.  Past Medical History:  Diagnosis Date  . Arthritis   . Deep vein thrombosis ([place])    right calf - 05/2012   . Diabetes mellitus without complication (HCC)    diet controlled   . GERD (gastroesophageal reflux disease)   . Hyperlipidemia   . Hypertension    Ejection fraction =>55% Left ventricular systolic function is normal. Left ventricular wall motion is normal    . Lymphocytic colitis   . Neuropathy    diabetic - in bilateral feet   . Pityriasis lichenoides chronica   . Sleep apnea    bipap    Current Outpatient Medications:  .  allopurinol (ZYLOPRIM) 100 MG tablet, Take 100 mg by mouth 2 (two) times daily., Disp: , Rfl:  .  aspirin 81 MG tablet, Take 81 mg by mouth every other day. , Disp: , Rfl:  .  Aspirin Buf,CaCarb-MgCarb-MgO, 81 MG TABS, Take by mouth., Disp: , Rfl:  .  Budesonide (UCERIS) 9 MG TB24, Take 9 mg by mouth daily. , Disp: , Rfl:  .  cephALEXin (KEFLEX) 500 MG capsule, Take 1 capsule (500 mg total) by mouth 3 (three) times daily., Disp: 24 capsule, Rfl: 1 .  Certolizumab Pegol (CIMZIA [place]), Inject 400 mg into the skin every 30 (thirty) days. 200 mg on each side of stomach, Disp: , Rfl:  .  Cholecalciferol (VITAMIN D3) 1000 UNITS CAPS, Take 1,000 Units by mouth 2 (two) times daily. , Disp: , Rfl:  .  citalopram (CELEXA) 40 MG tablet, Take 40 mg by mouth daily., Disp: , Rfl:  .  clindamycin (CLEOCIN) 300 MG capsule, Take 1 capsule (300 mg total) by mouth 2 (two) times daily., Disp: 14 capsule, Rfl: 0 .  Cyanocobalamin (VITAMIN B-12) 1000 MCG SUBL, Place 1,000 mcg under the tongue daily., Disp: , Rfl:  .  cyclobenzaprine (FLEXERIL) 10 MG  tablet, Take 10 mg by mouth daily. , Disp: , Rfl:  .  Dietary Management Product (RHEUMATE) CAPS, Take 1 capsule by mouth daily., Disp: , Rfl: 0 .  doxycycline (VIBRA-TABS) 100 MG tablet, doxycycline hyclate 100 mg tabs, Disp: , Rfl:  .  furosemide (LASIX) 20 MG tablet, TAKE 1 TABLET BY MOUTH EVERY DAY AS NEEDED FOR SWELLING, Disp: 30 tablet, Rfl: 3 .  hydrochlorothiazide (MICROZIDE) 12.5 MG capsule, Take 1 capsule (12.5 mg total) by mouth as needed (swelling)., Disp: 30 capsule, Rfl: 3 .  lisinopril-hydrochlorothiazide (PRINZIDE,ZESTORETIC) 20-12.5 MG per tablet, Take 1 tablet by mouth every morning., Disp: , Rfl:  .  loperamide (IMODIUM) 2 MG capsule, Take 6 mg by mouth 2 (two) times daily., Disp: , Rfl:  .  Melatonin 5 MG SUBL, Place 5 mg under the tongue at bedtime., Disp: , Rfl:  .  metFORMIN (GLUCOPHAGE) 500 MG tablet, Take 500 mg by mouth 2 (two) times daily with a meal., Disp: , Rfl:  .  metFORMIN (GLUCOPHAGE-XR) 500 MG 24 hr tablet, , Disp: , Rfl:  .  methotrexate (RHEUMATREX) 2.5 MG tablet, Take 20 mg by mouth once a week. Caution: Chemotherapy. Protect from light. Takes on Friday, Disp: , Rfl:  .  mupirocin cream (  BACTROBAN) 2 %, Apply 1 application topically 2 (two) times daily as needed (psoriasis). , Disp: , Rfl:  .  omeprazole (PRILOSEC) 40 MG capsule, Take 40 mg by mouth at bedtime., Disp: , Rfl:  .  potassium chloride (K-DUR) 10 MEQ tablet, , Disp: , Rfl:  .  PRESCRIPTION MEDICATION, Place 3 drops into both ears as needed. Fluocinolone 0.01% Ear Drops. For inflammation and itching of the ear., Disp: , Rfl:  .  Probiotic Product (ALIGN PO), Take 1 capsule by mouth daily., Disp: , Rfl:  .  protein supplement shake (PREMIER PROTEIN) LIQD, Take 325 mLs (11 oz total) by mouth 2 (two) times daily between meals., Disp: 60 Can, Rfl: 0 .  Wound Dressings (MEDIHONEY WOUND/BURN DRESSING) GEL, Apply pea sized amount topically to wound daily, Disp: 14 mL, Rfl: 0  Social History   Tobacco Use   Smoking Status Never Smoker  Smokeless Tobacco Never Used    Allergies  Allergen Reactions  . Other Shortness Of Breath    Blue fish: palms and feet turn red  . Bee Venom Swelling  . Sulfa Antibiotics Hives   Objective:   There were no vitals filed for this visit. There is no height or weight on file to calculate BMI. Constitutional Well developed. Well nourished.  Vascular Dorsalis pedis pulses palpable bilaterally. Posterior tibial pulses palpable bilaterally. Capillary refill normal to all digits.  No cyanosis or clubbing noted. Pedal hair growth normal.  Neurologic Normal speech. Oriented to person, place, and time. Protective sensation absent  Dermatologic Wound Location: Left midfoot Wound Base: granular Peri-wound: intact Exudate: Scant/small amount Serosanguinous exudate  Wound base measuring 2x1 post debridement periwound redness without warmth  Orthopedic: No pain to palpation either foot.   Radiographs: None Assessment:   1. Ulcer of heel and midfoot, left, with fat layer exposed ([place])    Plan:  Patient was evaluated and treated and all questions answered.  Ulcer left midfoot -Local redness without warmth today. ? Ongoing Charcot process but not edematous. Refill Clinda. -Excisional debridement as below. -Again discussed performing wound excision, excision of bony prominence to achieve ulcer healing. Will defer -Discussed possible Total Contact cast, will also defer today. -Continue WBAT in Boot ABIs performed today right 1.25 left 1.24  Procedure: Excisional Debridement of Wound Rationale: Removal of non-viable soft tissue from the wound to promote healing.  Anesthesia: none Pre-Debridement Wound Measurements: 0.5 cm x 1 cm x 0.2 cm  Post-Debridement Wound Measurements: 1 cm x 2 cm x 0.2 cm  Type of Debridement: Sharp Excisional Tissue Removed: Non-viable soft tissue Depth of Debridement: subcutaneous tissue. Technique: Sharp excisional  debridement to bleeding, viable wound base.  Dressing: Dry, sterile, compression dressing. Disposition: Patient tolerated procedure well. Patient to return in 1 week for follow-up.  Return in about 1 week (around 03/23/2019).

## 2019-03-16 NOTE — Addendum Note (Signed)
Addended by: Hardie Pulley on: 03/16/2019 05:33 PM   Modules accepted: Level of Service

## 2019-03-17 NOTE — Addendum Note (Signed)
Addended by: Wardell Heath on: 03/17/2019 02:51 PM   Modules accepted: Orders

## 2019-03-24 ENCOUNTER — Ambulatory Visit: Payer: Medicare Other | Admitting: Podiatry

## 2019-03-24 ENCOUNTER — Encounter: Payer: Self-pay | Admitting: Podiatry

## 2019-03-24 ENCOUNTER — Ambulatory Visit (INDEPENDENT_AMBULATORY_CARE_PROVIDER_SITE_OTHER): Payer: Medicare Other | Admitting: Podiatry

## 2019-03-24 ENCOUNTER — Other Ambulatory Visit: Payer: Self-pay

## 2019-03-24 DIAGNOSIS — I739 Peripheral vascular disease, unspecified: Secondary | ICD-10-CM

## 2019-03-24 DIAGNOSIS — M898X7 Other specified disorders of bone, ankle and foot: Secondary | ICD-10-CM

## 2019-03-24 DIAGNOSIS — L97422 Non-pressure chronic ulcer of left heel and midfoot with fat layer exposed: Secondary | ICD-10-CM | POA: Diagnosis not present

## 2019-03-27 ENCOUNTER — Other Ambulatory Visit: Payer: Self-pay

## 2019-03-27 ENCOUNTER — Ambulatory Visit (INDEPENDENT_AMBULATORY_CARE_PROVIDER_SITE_OTHER): Payer: Medicare Other | Admitting: Podiatry

## 2019-03-27 ENCOUNTER — Encounter: Payer: Self-pay | Admitting: Podiatry

## 2019-03-27 DIAGNOSIS — E1142 Type 2 diabetes mellitus with diabetic polyneuropathy: Secondary | ICD-10-CM | POA: Diagnosis not present

## 2019-03-27 DIAGNOSIS — B351 Tinea unguium: Secondary | ICD-10-CM

## 2019-03-27 DIAGNOSIS — M79676 Pain in unspecified toe(s): Secondary | ICD-10-CM

## 2019-03-27 DIAGNOSIS — M67471 Ganglion, right ankle and foot: Secondary | ICD-10-CM | POA: Diagnosis not present

## 2019-03-27 DIAGNOSIS — L84 Corns and callosities: Secondary | ICD-10-CM

## 2019-03-27 MED ORDER — MUPIROCIN 2 % EX OINT: TOPICAL_OINTMENT | CUTANEOUS | 0 refills | Status: DC

## 2019-03-27 NOTE — Patient Instructions (Addendum)
Instructions for right great toe dressing changes: 1.  Cleanse right great toe with saline.    2.  Apply Mupirocin Ointment and bandaid once daily. 3.  We will call you with your lab results. 4.  Follow up with [name] on 8.28.2020. 5.  Call us if you experience any warmth, redness, drainage, swelling, pain or odor.   Diabetes Mellitus and Foot Care Foot care is an important part of your health, especially when you have diabetes. Diabetes may cause you to have problems because of poor blood flow (circulation) to your feet and legs, which can cause your skin to:  Become thinner and drier.  Break more easily.  Heal more slowly.  Peel and crack. You may also have nerve damage (neuropathy) in your legs and feet, causing decreased feeling in them. This means that you may not notice minor injuries to your feet that could lead to more serious problems. Noticing and addressing any potential problems early is the best way to prevent future foot problems. How to care for your feet Foot hygiene  Wash your feet daily with warm water and mild soap. Do not use hot water. Then, pat your feet and the areas between your toes until they are completely dry. Do not soak your feet as this can dry your skin.  Trim your toenails straight across. Do not dig under them or around the cuticle. File the edges of your nails with an emery board or nail file.  Apply a moisturizing lotion or petroleum jelly to the skin on your feet and to dry, brittle toenails. Use lotion that does not contain alcohol and is unscented. Do not apply lotion between your toes. Shoes and socks  Wear clean socks or stockings every day. Make sure they are not too tight. Do not wear knee-high stockings since they may decrease blood flow to your legs.  Wear shoes that fit properly and have enough cushioning. Always look in your shoes before you put them on to be sure there are no objects inside.  To break in new shoes, wear them for just  a few hours a day. This prevents injuries on your feet. Wounds, scrapes, corns, and calluses  Check your feet daily for blisters, cuts, bruises, sores, and redness. If you cannot see the bottom of your feet, use a mirror or ask someone for help.  Do not cut corns or calluses or try to remove them with medicine.  If you find a minor scrape, cut, or break in the skin on your feet, keep it and the skin around it clean and dry. You may clean these areas with mild soap and water. Do not clean the area with peroxide, alcohol, or iodine.  If you have a wound, scrape, corn, or callus on your foot, look at it several times a day to make sure it is healing and not infected. Check for: ? Redness, swelling, or pain. ? Fluid or blood. ? Warmth. ? Pus or a bad smell. General instructions  Do not cross your legs. This may decrease blood flow to your feet.  Do not use heating pads or hot water bottles on your feet. They may burn your skin. If you have lost feeling in your feet or legs, you may not know this is happening until it is too late.  Protect your feet from hot and cold by wearing shoes, such as at the beach or on hot pavement.  Schedule a complete foot exam at least once a year (  annually) or more often if you have foot problems. If you have foot problems, report any cuts, sores, or bruises to your health care provider immediately. Contact a health care provider if:  You have a medical condition that increases your risk of infection and you have any cuts, sores, or bruises on your feet.  You have an injury that is not healing.  You have redness on your legs or feet.  You feel burning or tingling in your legs or feet.  You have pain or cramps in your legs and feet.  Your legs or feet are numb.  Your feet always feel cold.  You have pain around a toenail. Get help right away if:  You have a wound, scrape, corn, or callus on your foot and: ? You have pain, swelling, or redness that  gets worse. ? You have fluid or blood coming from the wound, scrape, corn, or callus. ? Your wound, scrape, corn, or callus feels warm to the touch. ? You have pus or a bad smell coming from the wound, scrape, corn, or callus. ? You have a fever. ? You have a red line going up your leg. Summary  Check your feet every day for cuts, sores, red spots, swelling, and blisters.  Moisturize feet and legs daily.  Wear shoes that fit properly and have enough cushioning.  If you have foot problems, report any cuts, sores, or bruises to your health care provider immediately.  Schedule a complete foot exam at least once a year (annually) or more often if you have foot problems. This information is not intended to replace advice given to you by your health care provider. Make sure you discuss any questions you have with your health care provider. Document Released: 07/24/2000 Document Revised: 09/08/2017 Document Reviewed: 08/28/2016 Elsevier Patient Education  [place]? An infection that lies within the keratin of your nail plate that is caused by a fungus.  WHY ME? Fungal infections affect all ages, sexes, races, and creeds.  There may be many factors that predispose you to a fungal infection such as age, coexisting medical conditions such as diabetes, or an autoimmune disease; stress, medications, fatigue, genetics, etc.  Bottom line: fungus thrives in a warm, moist environment and your shoes offer such a location.  IS IT CONTAGIOUS? Theoretically, yes.  You do not want to share shoes, nail clippers or files with someone who has fungal toenails.  Walking around barefoot in the same room or sleeping in the same bed is unlikely to transfer the organism.  It is important to realize, however, that fungus can spread easily from one nail to the next on the same foot.  HOW DO WE TREAT THIS?  There are several ways to treat this condition.  Treatment  may depend on many factors such as age, medications, pregnancy, liver and kidney conditions, etc.  It is best to ask your doctor which options are available to you.  1. No treatment.   Unlike many other medical concerns, you can live with this condition.  However for many people this can be a painful condition and may lead to ingrown toenails or a bacterial infection.  It is recommended that you keep the nails cut short to help reduce the amount of fungal nail. 2. Topical treatment.  These range from herbal remedies to prescription strength nail lacquers.  About 40-50% effective, topicals require twice daily application for approximately 9 to 12 months or  until an entirely new nail has grown out.  The most effective topicals are medical grade medications available through physicians offices. 3. Oral antifungal medications.  With an 80-90% cure rate, the most common oral medication requires 3 to 4 months of therapy and stays in your system for a year as the new nail grows out.  Oral antifungal medications do require blood work to make sure it is a safe drug for you.  A liver function panel will be performed prior to starting the medication and after the first month of treatment.  It is important to have the blood work performed to avoid any harmful side effects.  In general, this medication safe but blood work is required. 4. Laser Therapy.  This treatment is performed by applying a specialized laser to the affected nail plate.  This therapy is noninvasive, fast, and non-painful.  It is not covered by insurance and is therefore, out of pocket.  The results have been very good with a 80-95% cure rate.  The [facility] is the only practice in the area to offer this therapy. 5. Permanent Nail Avulsion.  Removing the entire nail so that a new nail will not grow back.

## 2019-03-28 NOTE — Progress Notes (Signed)
Subjective: [name] [name] is a 73 y.o. y.o. female who presents today for preventative diabetic foot care. She is seeing [name] for wound of the left foot.  She changes dressing daily with Medihoney.  She has lesion on right great toe and is unsure of what it is. She states her Dermatologist looked at it, but [name] [name] is uncertain of what the outcome was.   Leeroy Cha, MD is her PCP.    Allergies  Allergen Reactions  . Other Shortness Of Breath    Blue fish: palms and feet turn red  . Bee Venom Swelling  . Sulfa Antibiotics Hives    Objective:  Vascular Examination: Capillary refill time immediate x 10 digits.  Dorsalis pedis pulses palpable b/l.  Posterior tibial pulses palpable b/l.  Digital hair present x 10 digits.  Skin temperature gradient WNL b/l.  Dermatological Examination: Skin with normal turgor, texture and tone b/l.  Toenails 1-5 b/l discolored, thick, dystrophic with subungual debris.  Hyperkeratotic lesion left hallux IPJ. No erythema, no edema, no drainage, no flocculence noted.     Erythematous raised lesion dorsal hallux IPJ with flocculence and scab noted overlying lesion. No digital warmth. No edema.   Aspirated lesion right hallux revealed clear gelatinous material. No purulence, no odor, no deep space abscess noted.  Musculoskeletal: Muscle strength 5/5 to all LE muscle groups.  Neurological: Sensation diminished with 10 gram monofilament.  Assessment: 1. Onychomycosis toenails 1-5 b/l 2.  Callus left hallux 3.  Ganglion cyst right hallux 4.  NIDDM with neuropathy  Plan: 1. Continue diabetic foot care principles. Literature dispensed on today. 2. Toenails 1-5 b/l were debrided in length and girth without iatrogenic bleeding. 3. Hyperkeratotic lesion(s) left hallux pared with sterile scalpel blade without incident. 4. Sterile 18 gauge needle used to aspirate lesion right hallux. Fluid sent to [facility] for confirmation.  Digit cleansed with wound cleanser. Antibiotic ointment and bandaid applied. Rx sent to pharmacy for Mupirocin Ointment. [name] [name] was given written instructions on care of right hallux. She was instructed to apply Mupirocin Ointment and bandaid to digit once daily. Call office if she has any concerns. She is to see [name] on August 28th. I will be in the office as well. 5. Patient to continue soft, supportive shoe gear daily. 6. Patient to report any pedal injuries to medical professional immediately. 7. [name] [name] to call should there be a concern in the interim.

## 2019-03-29 ENCOUNTER — Telehealth: Payer: Self-pay | Admitting: Podiatry

## 2019-03-29 NOTE — Telephone Encounter (Signed)
Left voicemail to see if pt was going to pick up her medical records or did we need to fax them.

## 2019-03-29 NOTE — Telephone Encounter (Signed)
Ivin Booty from [facility] called stating she received a specimen for the patient but is unsure of what it is. She states that the code given does not match what they received and would like some clarification.   Please give her a call at [phone]  Ext [phone]

## 2019-03-30 LAB — CYTOLOGY - NON PAP

## 2019-03-30 LAB — NON-GYN, SPECIMEN A

## 2019-04-05 NOTE — Telephone Encounter (Signed)
Left another voicemail for pt to call back in regards to her medical records request to see if she still wants to pick them up or does she want them faxed.

## 2019-04-06 DIAGNOSIS — L814 Other melanin hyperpigmentation: Secondary | ICD-10-CM | POA: Diagnosis not present

## 2019-04-06 DIAGNOSIS — L821 Other seborrheic keratosis: Secondary | ICD-10-CM | POA: Diagnosis not present

## 2019-04-06 DIAGNOSIS — B078 Other viral warts: Secondary | ICD-10-CM | POA: Diagnosis not present

## 2019-04-06 DIAGNOSIS — L400 Psoriasis vulgaris: Secondary | ICD-10-CM | POA: Diagnosis not present

## 2019-04-06 DIAGNOSIS — D692 Other nonthrombocytopenic purpura: Secondary | ICD-10-CM | POA: Diagnosis not present

## 2019-04-07 ENCOUNTER — Ambulatory Visit (INDEPENDENT_AMBULATORY_CARE_PROVIDER_SITE_OTHER): Payer: Medicare Other

## 2019-04-07 ENCOUNTER — Ambulatory Visit (INDEPENDENT_AMBULATORY_CARE_PROVIDER_SITE_OTHER): Payer: Medicare Other | Admitting: Podiatry

## 2019-04-07 ENCOUNTER — Other Ambulatory Visit: Payer: Self-pay

## 2019-04-07 VITALS — Temp 97.7°F

## 2019-04-07 DIAGNOSIS — M14672 Charcot's joint, left ankle and foot: Secondary | ICD-10-CM | POA: Diagnosis not present

## 2019-04-07 DIAGNOSIS — L97422 Non-pressure chronic ulcer of left heel and midfoot with fat layer exposed: Secondary | ICD-10-CM

## 2019-04-07 DIAGNOSIS — I82492 Acute embolism and thrombosis of other specified deep vein of left lower extremity: Secondary | ICD-10-CM

## 2019-04-07 DIAGNOSIS — L4059 Other psoriatic arthropathy: Secondary | ICD-10-CM | POA: Diagnosis not present

## 2019-04-07 MED ORDER — CLINDAMYCIN HCL 300 MG PO CAPS: 300.0000 mg | ORAL_CAPSULE | Freq: Two times a day (BID) | ORAL | 0 refills | Status: DC

## 2019-04-10 ENCOUNTER — Ambulatory Visit (HOSPITAL_COMMUNITY)
Admission: RE | Admit: 2019-04-10 | Discharge: 2019-04-10 | Disposition: A | Discharge status: Home / Self Care | Payer: Medicare Other | Source: Ambulatory Visit | Attending: Cardiovascular Disease | Admitting: Cardiovascular Disease

## 2019-04-10 ENCOUNTER — Other Ambulatory Visit: Payer: Self-pay

## 2019-04-10 ENCOUNTER — Encounter (HOSPITAL_COMMUNITY): Payer: Medicare Other

## 2019-04-10 DIAGNOSIS — L97519 Non-pressure chronic ulcer of other part of right foot with unspecified severity: Secondary | ICD-10-CM | POA: Diagnosis not present

## 2019-04-10 DIAGNOSIS — I82492 Acute embolism and thrombosis of other specified deep vein of left lower extremity: Secondary | ICD-10-CM | POA: Insufficient documentation

## 2019-04-10 DIAGNOSIS — M19072 Primary osteoarthritis, left ankle and foot: Secondary | ICD-10-CM | POA: Diagnosis not present

## 2019-04-10 DIAGNOSIS — E1140 Type 2 diabetes mellitus with diabetic neuropathy, unspecified: Secondary | ICD-10-CM | POA: Diagnosis not present

## 2019-04-10 DIAGNOSIS — M79672 Pain in left foot: Secondary | ICD-10-CM | POA: Diagnosis not present

## 2019-04-10 DIAGNOSIS — E1142 Type 2 diabetes mellitus with diabetic polyneuropathy: Secondary | ICD-10-CM | POA: Insufficient documentation

## 2019-04-10 DIAGNOSIS — M19079 Primary osteoarthritis, unspecified ankle and foot: Secondary | ICD-10-CM | POA: Insufficient documentation

## 2019-04-12 DIAGNOSIS — E119 Type 2 diabetes mellitus without complications: Secondary | ICD-10-CM | POA: Diagnosis not present

## 2019-04-12 DIAGNOSIS — L03116 Cellulitis of left lower limb: Secondary | ICD-10-CM | POA: Diagnosis not present

## 2019-04-13 ENCOUNTER — Ambulatory Visit: Payer: Medicare Other | Admitting: Podiatry

## 2019-04-24 DIAGNOSIS — L97519 Non-pressure chronic ulcer of other part of right foot with unspecified severity: Secondary | ICD-10-CM | POA: Diagnosis not present

## 2019-04-24 DIAGNOSIS — M79672 Pain in left foot: Secondary | ICD-10-CM | POA: Diagnosis not present

## 2019-04-24 DIAGNOSIS — M19072 Primary osteoarthritis, left ankle and foot: Secondary | ICD-10-CM | POA: Diagnosis not present

## 2019-04-24 DIAGNOSIS — E1140 Type 2 diabetes mellitus with diabetic neuropathy, unspecified: Secondary | ICD-10-CM | POA: Diagnosis not present

## 2019-04-24 NOTE — Progress Notes (Signed)
  Subjective:  Patient ID: [name] [name], female    DOB: 01/07/1946,  MRN: [id number]  Chief Complaint  Patient presents with  . Foot Ulcer    the place on the midfoot of my left has a rash and that started yesterday and used bactrim and seems to be helping   Patient presents for followup of wound to the left midfoot.  States there is a rash above the foot and is concerned about redness.  States that it started on Wednesday.  Denies itching.  Endorses a red color with a little bit of swelling, has been taking Bactrim and states that it seems to be helping.  Has been up and on it a lot.  Reports a shooting pain.  Stopped wearing the boot as on Wednesday because it was rubbing against her leg.  Has been wearing a surgical shoe which helps.  Objective:   There were no vitals filed for this visit. General AA&O x3. Normal mood and affect.  Vascular Pedal pulses palpable.  Neurologic Epicritic sensation grossly intact.  Dermatologic Wound above the left midfoot with fibrogranular wound base, intact periwound, moderate amount of serosanguinous drainage.  Wound measuring 3 x 1 with mild periwound redness and erythema and deep probing, but without probe to bone.  Orthopedic: No pain to palpation either foot.   Radiographs: Taken and reviewed, no acute fractures or dislocations. Assessment/Plan:  Patient was evaluated and treated and all questions answered.  Ulcer of left midfoot. - Wound excisionally debrided.    Procedure: Excisional Debridement of Wound Rationale: Removal of non-viable soft tissue from the wound to promote healing.  Anesthesia: none Pre-Debridement Wound Measurements: 3 cm x 1 cm  Post-Debridement Wound Measurements: same Type of Debridement: Sharp Excisional Tissue Removed: Non-viable soft tissue Depth of Debridement: subcutaneous tissue. Technique: Sharp excisional debridement to bleeding, viable wound base.  Dressing: Prisma and dry sterile dressing. Disposition:  Patient tolerated procedure well. Patient to return in 1 week for follow-up.  Discussed with patient that I think she would likely benefit from surgical intervention in the future with resection of the bony prominence to reduce the ulceration.  We have discussed this previously, but patient had declined.  Will continue antibiotics, will refill.  Follow up in one week for recheck.  Dressed with Prisma and dry sterile dressing today.     Return in about 2 weeks (around 04/07/2019) for Wound Care, Left.

## 2019-04-26 DIAGNOSIS — E13621 Other specified diabetes mellitus with foot ulcer: Secondary | ICD-10-CM | POA: Diagnosis not present

## 2019-05-01 DIAGNOSIS — E13621 Other specified diabetes mellitus with foot ulcer: Secondary | ICD-10-CM | POA: Diagnosis not present

## 2019-05-01 DIAGNOSIS — M2142 Flat foot [pes planus] (acquired), left foot: Secondary | ICD-10-CM | POA: Diagnosis not present

## 2019-05-07 NOTE — Progress Notes (Signed)
  Subjective:  Patient ID: [name] [name], female    DOB: Feb 24, 1946,  MRN: [id number]  Chief Complaint  Patient presents with  . Wound Check    Left plantar wound check. Pt denies fever/nausea/vomiting/chills. Pt states she is concerned the wound is not healing. Pt states some drainage.  . Foot Problem    Left lower extremity medial aspect red lump. 2 day duration. Pt states tender to touch. Pt believes it is caused by coban wrap.   Patient presents for followup of wound to the left midfoot.  Hx as above.  Objective:   Vitals:   04/07/19 1542  Temp: 97.7 F (36.5 C)   General AA&O x3. Normal mood and affect.  Vascular Pedal pulses palpable.  Neurologic Epicritic sensation grossly intact.  Dermatologic Wound left midfoot measuring 3 x 1 with mild periwound redness no probe to bone but probes deep.  Orthopedic: No pain to palpation either foot.   Radiographs: Taken and reviewed, no acute fractures or dislocations. Assessment/Plan:  Patient was evaluated and treated and all questions answered.  Ulcer of left midfoot. - Redness has returned.  Rx Cleocin -Wound debrided as below  Procedure: Excisional Debridement of Wound Rationale: Removal of non-viable soft tissue from the wound to promote healing.  Anesthesia: none Pre-Debridement Wound Measurements: 2.5 cm x 1 cm x 0.4 cm  Post-Debridement Wound Measurements: 3 cm x 1 cm x 0.4 cm  Type of Debridement: Sharp Excisional Tissue Removed: Non-viable soft tissue Depth of Debridement: subcutaneous tissue. Technique: Sharp excisional debridement to bleeding, viable wound base.  Dressing: Dry, sterile, compression dressing. Disposition: Patient tolerated procedure well. Patient to return in 1 week for follow-up.     L Left Edema -Order DVT US  Follow-up in 1 week for recheck

## 2019-05-09 DIAGNOSIS — Z7984 Long term (current) use of oral hypoglycemic drugs: Secondary | ICD-10-CM | POA: Diagnosis not present

## 2019-05-09 DIAGNOSIS — G4733 Obstructive sleep apnea (adult) (pediatric): Secondary | ICD-10-CM | POA: Diagnosis not present

## 2019-05-09 DIAGNOSIS — L03116 Cellulitis of left lower limb: Secondary | ICD-10-CM | POA: Diagnosis not present

## 2019-05-09 DIAGNOSIS — E119 Type 2 diabetes mellitus without complications: Secondary | ICD-10-CM | POA: Diagnosis not present

## 2019-05-09 DIAGNOSIS — I10 Essential (primary) hypertension: Secondary | ICD-10-CM | POA: Diagnosis not present

## 2019-05-09 DIAGNOSIS — Z23 Encounter for immunization: Secondary | ICD-10-CM | POA: Diagnosis not present

## 2019-05-09 DIAGNOSIS — F325 Major depressive disorder, single episode, in full remission: Secondary | ICD-10-CM | POA: Diagnosis not present

## 2019-05-12 DIAGNOSIS — M2142 Flat foot [pes planus] (acquired), left foot: Secondary | ICD-10-CM | POA: Diagnosis not present

## 2019-05-12 DIAGNOSIS — E13621 Other specified diabetes mellitus with foot ulcer: Secondary | ICD-10-CM | POA: Diagnosis not present

## 2019-05-15 DIAGNOSIS — L4059 Other psoriatic arthropathy: Secondary | ICD-10-CM | POA: Diagnosis not present

## 2019-05-26 ENCOUNTER — Encounter: Payer: Self-pay | Admitting: Internal Medicine

## 2019-05-26 DIAGNOSIS — E11621 Type 2 diabetes mellitus with foot ulcer: Secondary | ICD-10-CM | POA: Diagnosis not present

## 2019-05-26 DIAGNOSIS — E1140 Type 2 diabetes mellitus with diabetic neuropathy, unspecified: Secondary | ICD-10-CM | POA: Diagnosis not present

## 2019-05-26 DIAGNOSIS — H04123 Dry eye syndrome of bilateral lacrimal glands: Secondary | ICD-10-CM | POA: Diagnosis not present

## 2019-05-26 DIAGNOSIS — Z961 Presence of intraocular lens: Secondary | ICD-10-CM | POA: Diagnosis not present

## 2019-05-26 DIAGNOSIS — H5213 Myopia, bilateral: Secondary | ICD-10-CM | POA: Diagnosis not present

## 2019-05-26 LAB — HM DIABETES EYE EXAM

## 2019-06-09 DIAGNOSIS — E1140 Type 2 diabetes mellitus with diabetic neuropathy, unspecified: Secondary | ICD-10-CM | POA: Diagnosis not present

## 2019-06-09 DIAGNOSIS — M19072 Primary osteoarthritis, left ankle and foot: Secondary | ICD-10-CM | POA: Diagnosis not present

## 2019-06-09 DIAGNOSIS — L97512 Non-pressure chronic ulcer of other part of right foot with fat layer exposed: Secondary | ICD-10-CM | POA: Diagnosis not present

## 2019-06-12 DIAGNOSIS — L4059 Other psoriatic arthropathy: Secondary | ICD-10-CM | POA: Diagnosis not present

## 2019-06-20 DIAGNOSIS — Z794 Long term (current) use of insulin: Secondary | ICD-10-CM | POA: Diagnosis not present

## 2019-06-20 DIAGNOSIS — E1169 Type 2 diabetes mellitus with other specified complication: Secondary | ICD-10-CM | POA: Diagnosis not present

## 2019-06-21 DIAGNOSIS — Z86010 Personal history of colonic polyps: Secondary | ICD-10-CM | POA: Diagnosis not present

## 2019-06-21 DIAGNOSIS — K219 Gastro-esophageal reflux disease without esophagitis: Secondary | ICD-10-CM | POA: Diagnosis not present

## 2019-06-21 DIAGNOSIS — K5289 Other specified noninfective gastroenteritis and colitis: Secondary | ICD-10-CM | POA: Diagnosis not present

## 2019-06-23 DIAGNOSIS — E13621 Other specified diabetes mellitus with foot ulcer: Secondary | ICD-10-CM | POA: Diagnosis not present

## 2019-06-26 ENCOUNTER — Ambulatory Visit: Payer: Medicare Other | Admitting: Podiatry

## 2019-06-30 DIAGNOSIS — L97519 Non-pressure chronic ulcer of other part of right foot with unspecified severity: Secondary | ICD-10-CM | POA: Diagnosis not present

## 2019-06-30 DIAGNOSIS — M19072 Primary osteoarthritis, left ankle and foot: Secondary | ICD-10-CM | POA: Diagnosis not present

## 2019-06-30 DIAGNOSIS — E1140 Type 2 diabetes mellitus with diabetic neuropathy, unspecified: Secondary | ICD-10-CM | POA: Diagnosis not present

## 2019-07-03 DIAGNOSIS — R509 Fever, unspecified: Secondary | ICD-10-CM | POA: Diagnosis not present

## 2019-07-07 ENCOUNTER — Emergency Department (HOSPITAL_COMMUNITY): Payer: Medicare Other

## 2019-07-07 ENCOUNTER — Other Ambulatory Visit: Payer: Self-pay

## 2019-07-07 ENCOUNTER — Inpatient Hospital Stay (HOSPITAL_COMMUNITY)
Admission: EM | Admit: 2019-07-07 | Discharge: 2019-07-13 | DRG: 177 | Disposition: A | Discharge status: Home / Self Care | Payer: Medicare Other | Attending: Internal Medicine | Admitting: Internal Medicine

## 2019-07-07 DIAGNOSIS — L411 Pityriasis lichenoides chronica: Secondary | ICD-10-CM | POA: Diagnosis present

## 2019-07-07 DIAGNOSIS — Z7982 Long term (current) use of aspirin: Secondary | ICD-10-CM | POA: Diagnosis not present

## 2019-07-07 DIAGNOSIS — J9601 Acute respiratory failure with hypoxia: Secondary | ICD-10-CM | POA: Diagnosis present

## 2019-07-07 DIAGNOSIS — Z6841 Body Mass Index (BMI) 40.0 and over, adult: Secondary | ICD-10-CM | POA: Diagnosis not present

## 2019-07-07 DIAGNOSIS — S92332A Displaced fracture of third metatarsal bone, left foot, initial encounter for closed fracture: Secondary | ICD-10-CM | POA: Diagnosis not present

## 2019-07-07 DIAGNOSIS — E871 Hypo-osmolality and hyponatremia: Secondary | ICD-10-CM | POA: Diagnosis present

## 2019-07-07 DIAGNOSIS — I10 Essential (primary) hypertension: Secondary | ICD-10-CM | POA: Diagnosis present

## 2019-07-07 DIAGNOSIS — Z823 Family history of stroke: Secondary | ICD-10-CM

## 2019-07-07 DIAGNOSIS — G4733 Obstructive sleep apnea (adult) (pediatric): Secondary | ICD-10-CM | POA: Diagnosis not present

## 2019-07-07 DIAGNOSIS — M159 Polyosteoarthritis, unspecified: Secondary | ICD-10-CM | POA: Diagnosis present

## 2019-07-07 DIAGNOSIS — K559 Vascular disorder of intestine, unspecified: Secondary | ICD-10-CM | POA: Diagnosis not present

## 2019-07-07 DIAGNOSIS — R000 Tachycardia, unspecified: Secondary | ICD-10-CM | POA: Diagnosis not present

## 2019-07-07 DIAGNOSIS — Z86718 Personal history of other venous thrombosis and embolism: Secondary | ICD-10-CM | POA: Diagnosis not present

## 2019-07-07 DIAGNOSIS — E876 Hypokalemia: Secondary | ICD-10-CM | POA: Diagnosis present

## 2019-07-07 DIAGNOSIS — E1140 Type 2 diabetes mellitus with diabetic neuropathy, unspecified: Secondary | ICD-10-CM

## 2019-07-07 DIAGNOSIS — Z79899 Other long term (current) drug therapy: Secondary | ICD-10-CM

## 2019-07-07 DIAGNOSIS — E662 Morbid (severe) obesity with alveolar hypoventilation: Secondary | ICD-10-CM | POA: Diagnosis present

## 2019-07-07 DIAGNOSIS — K219 Gastro-esophageal reflux disease without esophagitis: Secondary | ICD-10-CM | POA: Diagnosis present

## 2019-07-07 DIAGNOSIS — E860 Dehydration: Secondary | ICD-10-CM | POA: Diagnosis present

## 2019-07-07 DIAGNOSIS — Z882 Allergy status to sulfonamides status: Secondary | ICD-10-CM

## 2019-07-07 DIAGNOSIS — Z96641 Presence of right artificial hip joint: Secondary | ICD-10-CM | POA: Diagnosis present

## 2019-07-07 DIAGNOSIS — R5381 Other malaise: Secondary | ICD-10-CM | POA: Diagnosis present

## 2019-07-07 DIAGNOSIS — J1289 Other viral pneumonia: Secondary | ICD-10-CM | POA: Diagnosis present

## 2019-07-07 DIAGNOSIS — K52832 Lymphocytic colitis: Secondary | ICD-10-CM | POA: Diagnosis present

## 2019-07-07 DIAGNOSIS — I959 Hypotension, unspecified: Secondary | ICD-10-CM | POA: Diagnosis not present

## 2019-07-07 DIAGNOSIS — S92302A Fracture of unspecified metatarsal bone(s), left foot, initial encounter for closed fracture: Secondary | ICD-10-CM | POA: Diagnosis present

## 2019-07-07 DIAGNOSIS — U071 COVID-19: Principal | ICD-10-CM | POA: Diagnosis present

## 2019-07-07 DIAGNOSIS — Z79890 Hormone replacement therapy (postmenopausal): Secondary | ICD-10-CM | POA: Diagnosis not present

## 2019-07-07 DIAGNOSIS — Z8249 Family history of ischemic heart disease and other diseases of the circulatory system: Secondary | ICD-10-CM | POA: Diagnosis not present

## 2019-07-07 DIAGNOSIS — R918 Other nonspecific abnormal finding of lung field: Secondary | ICD-10-CM | POA: Diagnosis not present

## 2019-07-07 DIAGNOSIS — E1142 Type 2 diabetes mellitus with diabetic polyneuropathy: Secondary | ICD-10-CM | POA: Diagnosis present

## 2019-07-07 DIAGNOSIS — E119 Type 2 diabetes mellitus without complications: Secondary | ICD-10-CM

## 2019-07-07 DIAGNOSIS — I4510 Unspecified right bundle-branch block: Secondary | ICD-10-CM | POA: Diagnosis present

## 2019-07-07 DIAGNOSIS — Z794 Long term (current) use of insulin: Secondary | ICD-10-CM

## 2019-07-07 DIAGNOSIS — R0602 Shortness of breath: Secondary | ICD-10-CM | POA: Diagnosis not present

## 2019-07-07 DIAGNOSIS — R52 Pain, unspecified: Secondary | ICD-10-CM

## 2019-07-07 DIAGNOSIS — R531 Weakness: Secondary | ICD-10-CM | POA: Diagnosis not present

## 2019-07-07 DIAGNOSIS — L97509 Non-pressure chronic ulcer of other part of unspecified foot with unspecified severity: Secondary | ICD-10-CM | POA: Diagnosis present

## 2019-07-07 DIAGNOSIS — E1169 Type 2 diabetes mellitus with other specified complication: Secondary | ICD-10-CM | POA: Diagnosis not present

## 2019-07-07 DIAGNOSIS — J1282 Pneumonia due to coronavirus disease 2019: Secondary | ICD-10-CM | POA: Diagnosis present

## 2019-07-07 DIAGNOSIS — X58XXXA Exposure to other specified factors, initial encounter: Secondary | ICD-10-CM | POA: Diagnosis present

## 2019-07-07 DIAGNOSIS — L97529 Non-pressure chronic ulcer of other part of left foot with unspecified severity: Secondary | ICD-10-CM | POA: Diagnosis present

## 2019-07-07 DIAGNOSIS — Z792 Long term (current) use of antibiotics: Secondary | ICD-10-CM

## 2019-07-07 DIAGNOSIS — E11621 Type 2 diabetes mellitus with foot ulcer: Secondary | ICD-10-CM | POA: Diagnosis present

## 2019-07-07 DIAGNOSIS — R5383 Other fatigue: Secondary | ICD-10-CM

## 2019-07-07 DIAGNOSIS — R0902 Hypoxemia: Secondary | ICD-10-CM

## 2019-07-07 LAB — POC SARS CORONAVIRUS 2 AG -  ED: SARS Coronavirus 2 Ag: POSITIVE — AB

## 2019-07-07 LAB — HEMOGLOBIN A1C
Hgb A1c MFr Bld: 9.3 % — ABNORMAL HIGH (ref 4.8–5.6)
Mean Plasma Glucose: 220.21 mg/dL

## 2019-07-07 LAB — LACTATE DEHYDROGENASE: LDH: 192 U/L (ref 98–192)

## 2019-07-07 LAB — C-REACTIVE PROTEIN: CRP: 8.5 mg/dL — ABNORMAL HIGH (ref <1.0)

## 2019-07-07 LAB — TRIGLYCERIDES: Triglycerides: 93 mg/dL (ref <150)

## 2019-07-07 LAB — COMPREHENSIVE METABOLIC PANEL
ALT: 41 U/L (ref 0–44)
AST: 36 U/L (ref 15–41)
Albumin: 3.1 g/dL — ABNORMAL LOW (ref 3.5–5.0)
Alkaline Phosphatase: 62 U/L (ref 38–126)
Anion gap: 13 (ref 5–15)
BUN: 13 mg/dL (ref 8–23)
CO2: 27 mmol/L (ref 22–32)
Calcium: 8.8 mg/dL — ABNORMAL LOW (ref 8.9–10.3)
Chloride: 90 mmol/L — ABNORMAL LOW (ref 98–111)
Creatinine, Ser: 0.79 mg/dL (ref 0.44–1.00)
GFR calc Af Amer: >60 mL/min (ref >60)
GFR calc non Af Amer: >60 mL/min (ref >60)
Glucose, Bld: 161 mg/dL — ABNORMAL HIGH (ref 70–99)
Potassium: 3 mmol/L — ABNORMAL LOW (ref 3.5–5.1)
Sodium: 130 mmol/L — ABNORMAL LOW (ref 135–145)
Total Bilirubin: 0.6 mg/dL (ref 0.3–1.2)
Total Protein: 6.4 g/dL — ABNORMAL LOW (ref 6.5–8.1)

## 2019-07-07 LAB — CBC WITH DIFFERENTIAL/PLATELET
Abs Immature Granulocytes: 0.13 10*3/uL — ABNORMAL HIGH (ref 0.00–0.07)
Basophils Absolute: 0 10*3/uL (ref 0.0–0.1)
Basophils Relative: 0 %
Eosinophils Absolute: 0 10*3/uL (ref 0.0–0.5)
Eosinophils Relative: 0 %
HCT: 39.1 % (ref 36.0–46.0)
Hemoglobin: 12.3 g/dL (ref 12.0–15.0)
Immature Granulocytes: 2 %
Lymphocytes Relative: 14 %
Lymphs Abs: 0.9 10*3/uL (ref 0.7–4.0)
MCH: 28.9 pg (ref 26.0–34.0)
MCHC: 31.5 g/dL (ref 30.0–36.0)
MCV: 92 fL (ref 80.0–100.0)
Monocytes Absolute: 0.6 10*3/uL (ref 0.1–1.0)
Monocytes Relative: 8 %
Neutro Abs: 5.2 10*3/uL (ref 1.7–7.7)
Neutrophils Relative %: 76 %
Platelets: 185 10*3/uL (ref 150–400)
RBC: 4.25 MIL/uL (ref 3.87–5.11)
RDW: 17.4 % — ABNORMAL HIGH (ref 11.5–15.5)
WBC: 6.9 10*3/uL (ref 4.0–10.5)
nRBC: 0 % (ref 0.0–0.2)

## 2019-07-07 LAB — LACTIC ACID, PLASMA
Lactic Acid, Venous: 1.9 mmol/L (ref 0.5–1.9)
Lactic Acid, Venous: 2.7 mmol/L (ref 0.5–1.9)

## 2019-07-07 LAB — GLUCOSE, CAPILLARY: Glucose-Capillary: 253 mg/dL — ABNORMAL HIGH (ref 70–99)

## 2019-07-07 LAB — FIBRINOGEN: Fibrinogen: 183 mg/dL — ABNORMAL LOW (ref 210–475)

## 2019-07-07 LAB — TSH: TSH: 1.124 u[IU]/mL (ref 0.350–4.500)

## 2019-07-07 LAB — ABO/RH: ABO/RH(D): O POS

## 2019-07-07 LAB — FERRITIN: Ferritin: 53 ng/mL (ref 11–307)

## 2019-07-07 LAB — BRAIN NATRIURETIC PEPTIDE: B Natriuretic Peptide: 47 pg/mL (ref 0.0–100.0)

## 2019-07-07 LAB — PROCALCITONIN: Procalcitonin: <0.1 ng/mL

## 2019-07-07 LAB — D-DIMER, QUANTITATIVE: D-Dimer, Quant: 1.58 ug/mL-FEU — ABNORMAL HIGH (ref 0.00–0.50)

## 2019-07-07 MED ORDER — SODIUM CHLORIDE 0.9 % IV BOLUS
500.0000 mL | Freq: Once | INTRAVENOUS | Status: AC
  Administered 2019-07-07: 21:00:00 500 mL via INTRAVENOUS

## 2019-07-07 MED ORDER — PREMIER PROTEIN SHAKE: 11.0000 [oz_av] | Freq: Two times a day (BID) | ORAL | Status: DC

## 2019-07-07 MED ORDER — ENOXAPARIN SODIUM 60 MG/0.6ML ~~LOC~~ SOLN
60.0000 mg | SUBCUTANEOUS | Status: DC
  Administered 2019-07-07 – 2019-07-12 (×6): 60 mg via SUBCUTANEOUS
  Filled 2019-07-07 (×6): qty 0.6

## 2019-07-07 MED ORDER — INSULIN ASPART 100 UNIT/ML ~~LOC~~ SOLN
0.0000 [IU] | Freq: Every day | SUBCUTANEOUS | Status: DC
  Administered 2019-07-07 – 2019-07-08 (×2): 3 [IU] via SUBCUTANEOUS
  Administered 2019-07-09: 4 [IU] via SUBCUTANEOUS
  Administered 2019-07-10: 5 [IU] via SUBCUTANEOUS
  Administered 2019-07-11: 4 [IU] via SUBCUTANEOUS
  Administered 2019-07-12: 5 [IU] via SUBCUTANEOUS

## 2019-07-07 MED ORDER — RHEUMATE PO CAPS: 1.0000 | ORAL_CAPSULE | Freq: Every day | ORAL | Status: DC

## 2019-07-07 MED ORDER — ADULT MULTIVITAMIN W/MINERALS CH
1.0000 | ORAL_TABLET | Freq: Every day | ORAL | Status: DC
  Administered 2019-07-08 – 2019-07-13 (×6): 1 via ORAL
  Filled 2019-07-07 (×6): qty 1

## 2019-07-07 MED ORDER — SODIUM CHLORIDE 0.9 % IV SOLN
1000.0000 mL | INTRAVENOUS | Status: DC
  Administered 2019-07-07: 1000 mL via INTRAVENOUS

## 2019-07-07 MED ORDER — POTASSIUM CHLORIDE CRYS ER 20 MEQ PO TBCR
10.0000 meq | EXTENDED_RELEASE_TABLET | Freq: Every day | ORAL | Status: DC
  Administered 2019-07-08 – 2019-07-13 (×6): 10 meq via ORAL
  Filled 2019-07-07 (×6): qty 1

## 2019-07-07 MED ORDER — SODIUM CHLORIDE 0.9 % IV BOLUS
500.0000 mL | Freq: Once | INTRAVENOUS | Status: AC
  Administered 2019-07-08: 500 mL via INTRAVENOUS

## 2019-07-07 MED ORDER — FOLIC ACID 1 MG PO TABS
1.0000 mg | ORAL_TABLET | Freq: Every day | ORAL | Status: DC
  Administered 2019-07-08 – 2019-07-13 (×6): 1 mg via ORAL
  Filled 2019-07-07 (×6): qty 1

## 2019-07-07 MED ORDER — ACETAMINOPHEN 325 MG PO TABS
650.0000 mg | ORAL_TABLET | Freq: Four times a day (QID) | ORAL | Status: DC | PRN
  Administered 2019-07-07 – 2019-07-12 (×7): 650 mg via ORAL
  Filled 2019-07-07 (×7): qty 2

## 2019-07-07 MED ORDER — VITAMIN B-12 1000 MCG PO TABS
1000.0000 ug | ORAL_TABLET | Freq: Every day | ORAL | Status: DC
  Administered 2019-07-08 – 2019-07-13 (×6): 1000 ug via ORAL
  Filled 2019-07-07 (×6): qty 1

## 2019-07-07 MED ORDER — VITAMIN D3 25 MCG (1000 UNIT) PO TABS
1000.0000 [IU] | ORAL_TABLET | Freq: Two times a day (BID) | ORAL | Status: DC
  Administered 2019-07-07 – 2019-07-13 (×13): 1000 [IU] via ORAL
  Filled 2019-07-07 (×21): qty 1

## 2019-07-07 MED ORDER — ASPIRIN EC 81 MG PO TBEC
81.0000 mg | DELAYED_RELEASE_TABLET | ORAL | Status: DC
  Administered 2019-07-08 – 2019-07-10 (×2): 81 mg via ORAL
  Filled 2019-07-07 (×5): qty 1

## 2019-07-07 MED ORDER — POTASSIUM CHLORIDE CRYS ER 20 MEQ PO TBCR
20.0000 meq | EXTENDED_RELEASE_TABLET | Freq: Once | ORAL | Status: AC
  Administered 2019-07-07: 20 meq via ORAL
  Filled 2019-07-07: qty 1

## 2019-07-07 MED ORDER — VITAMIN B-12 1000 MCG SL SUBL: 1000.0000 ug | SUBLINGUAL_TABLET | Freq: Every day | SUBLINGUAL | Status: DC

## 2019-07-07 MED ORDER — POTASSIUM CHLORIDE IN NACL 20-0.9 MEQ/L-% IV SOLN
INTRAVENOUS | Status: DC
  Administered 2019-07-07 – 2019-07-09 (×5): via INTRAVENOUS
  Filled 2019-07-07 (×6): qty 1000

## 2019-07-07 MED ORDER — SODIUM CHLORIDE 0.9 % IV BOLUS: 1000.0000 mL | Freq: Once | INTRAVENOUS | Status: DC

## 2019-07-07 MED ORDER — INSULIN ASPART 100 UNIT/ML ~~LOC~~ SOLN
0.0000 [IU] | Freq: Three times a day (TID) | SUBCUTANEOUS | Status: DC
  Administered 2019-07-08 – 2019-07-09 (×5): 3 [IU] via SUBCUTANEOUS
  Administered 2019-07-09: 15 [IU] via SUBCUTANEOUS
  Administered 2019-07-10: 5 [IU] via SUBCUTANEOUS
  Administered 2019-07-10: 8 [IU] via SUBCUTANEOUS
  Administered 2019-07-10 – 2019-07-11 (×2): 11 [IU] via SUBCUTANEOUS
  Administered 2019-07-11: 5 [IU] via SUBCUTANEOUS
  Administered 2019-07-11: 15 [IU] via SUBCUTANEOUS
  Administered 2019-07-12 (×2): 5 [IU] via SUBCUTANEOUS
  Administered 2019-07-12: 15 [IU] via SUBCUTANEOUS
  Administered 2019-07-13: 3 [IU] via SUBCUTANEOUS
  Administered 2019-07-13: 5 [IU] via SUBCUTANEOUS

## 2019-07-07 MED ORDER — MELATONIN 5 MG PO TABS
5.0000 mg | ORAL_TABLET | Freq: Every day | ORAL | Status: DC
  Administered 2019-07-07 – 2019-07-12 (×6): 5 mg via ORAL
  Filled 2019-07-07 (×8): qty 1

## 2019-07-07 MED ORDER — DM-GUAIFENESIN ER 30-600 MG PO TB12
1.0000 | ORAL_TABLET | Freq: Two times a day (BID) | ORAL | Status: DC
  Administered 2019-07-07 – 2019-07-13 (×12): 1 via ORAL
  Filled 2019-07-07 (×12): qty 1

## 2019-07-07 MED ORDER — ALLOPURINOL 100 MG PO TABS
100.0000 mg | ORAL_TABLET | Freq: Two times a day (BID) | ORAL | Status: DC
  Administered 2019-07-07 – 2019-07-13 (×12): 100 mg via ORAL
  Filled 2019-07-07 (×12): qty 1

## 2019-07-07 MED ORDER — FUROSEMIDE 20 MG PO TABS: 20.0000 mg | ORAL_TABLET | Freq: Every day | ORAL | Status: DC

## 2019-07-07 MED ORDER — MELATONIN 5 MG SL SUBL: 5.0000 mg | SUBLINGUAL_TABLET | Freq: Every day | SUBLINGUAL | Status: DC

## 2019-07-07 NOTE — ED Notes (Signed)
Report called to  Receiving nurse Jeanette Caprice

## 2019-07-07 NOTE — ED Notes (Signed)
Advised to wait on patient transport as there is an issue with hepa filter in pt assigned room on the floor.

## 2019-07-07 NOTE — ED Notes (Signed)
Patient o2 sat is 100 on room air. This nurse attempted to get patient out of bed to ambulate so her o2 requirement could be assessed. Patient stated " I am too weak to get up and walk. I cannot walk or I will fall". Notified MD.

## 2019-07-07 NOTE — ED Provider Notes (Signed)
[facility]-EMERGENCY DEPT Provider Note   CSN: [id number] Arrival date & time: 07/07/19  1221     History   Chief Complaint Chief Complaint  Patient presents with  . Fatigue    COVID+  . Nasal Congestion    HPI [name] [name] is a 73 y.o. female.  She said she was diagnosed with Covid 4 5 days ago.  She said she started being sick couple of days before that.  Complaining of intermittent headache body aches fever nonproductive cough nausea and generalized fatigue.  Her husband is in hospital with Covid.     The history is provided by the patient.  Influenza Presenting symptoms: cough, fatigue, fever, headache, myalgias, nausea and shortness of breath   Presenting symptoms: no sore throat   Severity:  Moderate Onset quality:  Gradual Progression:  Unchanged Chronicity:  New Relieved by:  Rest Worsened by:  Movement Ineffective treatments:  None tried Associated symptoms: decreased appetite and decreased physical activity   Associated symptoms: no syncope   Risk factors: being elderly, diabetes and sick contacts     Past Medical History:  Diagnosis Date  . Arthritis   . Deep vein thrombosis (HCC)    right calf - 05/2012   . Diabetes mellitus without complication (HCC)    diet controlled   . GERD (gastroesophageal reflux disease)   . Hyperlipidemia   . Hypertension    Ejection fraction =>55% Left ventricular systolic function is normal. Left ventricular wall motion is normal    . Lymphocytic colitis   . Neuropathy    diabetic - in bilateral feet   . Pityriasis lichenoides chronica   . Sleep apnea    bipap    Patient Active Problem List   Diagnosis Date Noted  . Pain in left knee 10/13/2018  . Ischemic colitis (HCC) 10/06/2018  . Arthritis 07/21/2018  . Urinary incontinence 07/21/2018  . Diabetic foot ulcer (HCC) 07/13/2018  . Closed fracture of fifth metatarsal bone 05/13/2018  . Cellulitis of toe of left foot 05/02/2018  . DM2  (diabetes mellitus, type 2) (HCC) 05/02/2018  . Psoriasis 05/02/2018  . Pain in joint of right ankle 04/25/2018  . Osteoarthritis of knee 10/18/2017  . Chest pain 01/12/2015  . Right bundle branch block 05/20/2014  . OSA (obstructive sleep apnea) 03/12/2013  . Essential hypertension 03/12/2013  . Incomplete RBBB 03/12/2013  . Expected blood loss anemia 09/14/2012  . Obesity 09/14/2012  . Hyponatremia 09/14/2012  . S/P right THA, AA 09/13/2012  . Deep venous thrombosis of lower extremity (HCC) 08/10/2010  . 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 DILATION AND CURETTAGE OF UTERUS    . HAMMER TOE SURGERY    . right hand surgery      due to blood infection   . torn meniscus repair      right knee   . TOTAL HIP ARTHROPLASTY  09/13/2012   Procedure: TOTAL HIP ARTHROPLASTY ANTERIOR APPROACH;  Surgeon: Shelda Pal, MD;  Location: [facility];  Service: Orthopedics;  Laterality: Right;     OB History   No obstetric history on file.      Home Medications    Prior to Admission medications   Medication Sig Start Date End Date Taking? Authorizing Provider  allopurinol (ZYLOPRIM) 100 MG tablet Take 100 mg by mouth 2 (two) times daily.    [provider]  aspirin 81 MG tablet Take 81 mg by mouth  every other day.     [provider]  Aspirin Buf,CaCarb-MgCarb-MgO, 81 MG TABS Take by mouth. 10/06/18   [provider]  Budesonide (UCERIS) 9 MG TB24 Take 9 mg by mouth daily.     [provider]  cephALEXin (KEFLEX) 500 MG capsule Take 1 capsule (500 mg total) by mouth 3 (three) times daily. 12/22/18   Lenn Sink, DPM  Certolizumab Pegol (CIMZIA [place]) Inject 400 mg into the skin every 30 (thirty) days. 200 mg on each side of stomach    [provider]  Cholecalciferol (VITAMIN D3) 1000 UNITS CAPS Take 1,000 Units by mouth 2 (two) times daily.     [provider]  citalopram (CELEXA)  40 MG tablet Take 40 mg by mouth daily.    [provider]  clindamycin (CLEOCIN) 300 MG capsule Take 1 capsule (300 mg total) by mouth 2 (two) times daily. 04/07/19   Park Liter, DPM  Cyanocobalamin (VITAMIN B-12) 1000 MCG SUBL Place 1,000 mcg under the tongue daily.    [provider]  cyclobenzaprine (FLEXERIL) 10 MG tablet Take 10 mg by mouth daily.     [provider]  Dietary Management Product (RHEUMATE) CAPS Take 1 capsule by mouth daily. 02/07/18   [provider]  doxycycline (VIBRA-TABS) 100 MG tablet doxycycline hyclate 100 mg tabs    [provider]  furosemide (LASIX) 20 MG tablet TAKE 1 TABLET BY MOUTH EVERY DAY AS NEEDED FOR SWELLING 11/09/18   Lennette Bihari, MD  hydrochlorothiazide (MICROZIDE) 12.5 MG capsule Take 1 capsule (12.5 mg total) by mouth as needed (swelling). 11/10/17 07/11/18  Lennette Bihari, MD  LEVEMIR FLEXTOUCH 100 UNIT/ML Pen  06/26/19   [provider]  lisinopril-hydrochlorothiazide (ZESTORETIC) 20-25 MG tablet Take 1 tablet by mouth daily. 05/25/19   [provider]  loperamide (IMODIUM) 2 MG capsule Take 6 mg by mouth 2 (two) times daily.    [provider]  Melatonin 5 MG SUBL Place 5 mg under the tongue at bedtime.    [provider]  metFORMIN (GLUCOPHAGE) 500 MG tablet Take 500 mg by mouth 2 (two) times daily with a meal.    [provider]  metFORMIN (GLUCOPHAGE-XR) 500 MG 24 hr tablet  02/06/19   [provider]  methotrexate (RHEUMATREX) 2.5 MG tablet Take 20 mg by mouth once a week. Caution: Chemotherapy. Protect from light. Takes on Friday    [provider]  mupirocin cream (BACTROBAN) 2 % Apply 1 application topically 2 (two) times daily as needed (psoriasis).     [provider]  mupirocin ointment (BACTROBAN) 2 % Apply to right great toe and cover with band-aid once daily. 03/27/19 03/26/20  Freddie Breech, DPM  omeprazole  (PRILOSEC) 40 MG capsule Take 40 mg by mouth at bedtime.    [provider]  potassium chloride (K-DUR) 10 MEQ tablet  01/09/19   [provider]  PRESCRIPTION MEDICATION Place 3 drops into both ears as needed. Fluocinolone 0.01% Ear Drops. For inflammation and itching of the ear.    [provider]  Probiotic Product (ALIGN PO) Take 1 capsule by mouth daily.    [provider]  protein supplement shake (PREMIER PROTEIN) LIQD Take 325 mLs (11 oz total) by mouth 2 (two) times daily between meals. 05/03/18   Calvert Cantor, MD  Wound Dressings (MEDIHONEY WOUND/BURN DRESSING) GEL Apply pea sized amount topically to wound daily 02/03/19   Park Liter, DPM  Family History Family History  Problem Relation Age of Onset  . Hypertension Mother     Social History Social History   Tobacco Use  . Smoking status: Never Smoker  . Smokeless tobacco: Never Used  Substance Use Topics  . Alcohol use: Yes    Comment: occasional wine   . Drug use: No     Allergies   Other, Bee venom, and Sulfa antibiotics   Review of Systems Review of Systems  Constitutional: Positive for decreased appetite, fatigue and fever.  HENT: Negative for sore throat.   Eyes: Negative for visual disturbance.  Respiratory: Positive for cough and shortness of breath.   Cardiovascular: Negative for chest pain.  Gastrointestinal: Positive for nausea. Negative for abdominal pain.  Genitourinary: Negative for dysuria.  Musculoskeletal: Positive for myalgias.  Skin: Negative for rash.  Neurological: Positive for headaches.     Physical Exam Updated Vital Signs BP (!) 123/58 (BP Location: Right Arm)   Pulse (!) 102   Temp 100.2 F (37.9 C) (Oral)   Resp (!) 21   Ht 5\' 6"  (1.676 m)   Wt 117.9 kg   SpO2 96%   BMI 41.97 kg/m   Physical Exam Vitals signs and nursing note reviewed.  Constitutional:      General: She is not in acute distress.    Appearance: She is  well-developed.  HENT:     Head: Normocephalic and atraumatic.  Eyes:     Conjunctiva/sclera: Conjunctivae normal.  Neck:     Musculoskeletal: Neck supple.  Cardiovascular:     Rate and Rhythm: Normal rate and regular rhythm.     Pulses: Normal pulses.     Heart sounds: No murmur.  Pulmonary:     Effort: Pulmonary effort is normal. No respiratory distress.     Breath sounds: Normal breath sounds.  Abdominal:     Palpations: Abdomen is soft.     Tenderness: There is no abdominal tenderness.  Musculoskeletal: Normal range of motion.        General: No signs of injury.     Right lower leg: Edema present.     Left lower leg: Edema present.  Skin:    General: Skin is warm and dry.     Capillary Refill: Capillary refill takes less than 2 seconds.  Neurological:     General: No focal deficit present.     Mental Status: She is alert.      ED Treatments / Results  Labs (all labs ordered are listed, but only abnormal results are displayed) Labs Reviewed  CBC WITH DIFFERENTIAL/PLATELET - Abnormal; Notable for the following components:      Result Value   RDW 17.4 (*)    Abs Immature Granulocytes 0.13 (*)    All other components within normal limits  COMPREHENSIVE METABOLIC PANEL - Abnormal; Notable for the following components:   Sodium 130 (*)    Potassium 3.0 (*)    Chloride 90 (*)    Glucose, Bld 161 (*)    Calcium 8.8 (*)    Total Protein 6.4 (*)    Albumin 3.1 (*)    All other components within normal limits  D-DIMER, QUANTITATIVE (NOT AT [facility]) - Abnormal; Notable for the following components:   D-Dimer, Quant 1.58 (*)    All other components within normal limits  FIBRINOGEN - Abnormal; Notable for the following components:   Fibrinogen 183 (*)    All other components within normal limits  C-REACTIVE PROTEIN - Abnormal; Notable for the  following components:   CRP 8.5 (*)    All other components within normal limits  POC SARS CORONAVIRUS 2 AG -  ED - Abnormal;  Notable for the following components:   SARS Coronavirus 2 Ag POSITIVE (*)    All other components within normal limits  CULTURE, BLOOD (ROUTINE X 2)  CULTURE, BLOOD (ROUTINE X 2)  LACTIC ACID, PLASMA  PROCALCITONIN  LACTATE DEHYDROGENASE  FERRITIN  TRIGLYCERIDES  LACTIC ACID, PLASMA    EKG EKG Interpretation  Date/Time:  Friday July 07 2019 12:38:53 EST Ventricular Rate:  102 PR Interval:    QRS Duration: 121 QT Interval:  346 QTC Calculation: 451 R Axis:   34 Text Interpretation: Sinus tachycardia Right bundle branch block No old tracing to compare Confirmed by Meridee Score [phone]) on 07/07/2019 1:47:15 PM   Radiology Dg Chest Port 1 View  Result Date: 07/07/2019 CLINICAL DATA:  Shortness of breath. COVID-19. Weakness and fatigue and chest congestion. EXAM: PORTABLE CHEST 1 VIEW COMPARISON:  08/22/2015 FINDINGS: The heart size and mediastinal contours are within normal limits. Both lungs are clear. Severe chronic arthritic changes of both glenohumeral joints. No acute bone abnormality. IMPRESSION: No active disease in the chest. Electronically Signed   By: Francene Boyers M.D.   On: 07/07/2019 14:39    Procedures Procedures (including critical care time)  Medications Ordered in ED Medications  0.9 %  sodium chloride infusion (1,000 mLs Intravenous New Bag/Given 07/07/19 1416)  dextromethorphan-guaiFENesin (MUCINEX DM) 30-600 MG per 12 hr tablet 1 tablet (has no administration in time range)  0.9 % NaCl with KCl 20 mEq/ L  infusion (has no administration in time range)  potassium chloride SA (KLOR-CON) CR tablet 20 mEq (20 mEq Oral Given 07/07/19 1617)     Initial Impression / Assessment and Plan / ED Course  I have reviewed the triage vital signs and the nursing notes.  Pertinent labs & imaging results that were available during my care of the patient were reviewed by me and considered in my medical decision making (see chart for details).  Clinical Course as  of Jul 06 1799  Fri Jul 07, 2019  314 73 year old female with Covid positive here withSymptoms of Covid including cough fever fatigue shortness of breath.  It sounds like she is unable to care for herself at home and her husband is already in the hospital with Covid.  Checking labs to see if there is any metabolic derangements.  Chest x-ray to evaluate for pneumonia.  I do not see a Covid test resulted in her chart so we will order the point-of-care Covid test.  She was unwilling to try to get out of bed and ambulate due to her fatigue so I do not see that she will likely be able to be discharged.   [MB]  1437 Chest x-ray interpreted by me as no gross infiltrates, question left base possible.  Awaiting radiology reading.   [MB]  1540 Discussed with Dr. Zola Button from tried hospitalist who will evaluate the patient for admission.   [MB]    Clinical Course User Index [MB] Terrilee Files, MD   [name] [name] was evaluated in Emergency Department on 07/07/2019 for the symptoms described in the history of present illness. She was evaluated in the context of the global COVID-19 pandemic, which necessitated consideration that the patient might be at risk for infection with the SARS-CoV-2 virus that causes COVID-19. Institutional protocols and algorithms that pertain to the evaluation of patients at risk  for COVID-19 are in a state of rapid change based on information released by regulatory bodies including the CDC and federal and state organizations. These policies and algorithms were followed during the patient's care in the ED.      Final Clinical Impressions(s) / ED Diagnoses   Final diagnoses:  COVID-19 virus infection  Fatigue, unspecified type  Hypokalemia    ED Discharge Orders    None       Terrilee Files, MD 07/07/19 1800

## 2019-07-07 NOTE — ED Notes (Signed)
ED TO INPATIENT HANDOFF REPORT  ED Nurse Name and Phone #: Jeoffrey Massed Name/Age/Gender [name] [name] 73 y.o. female Room/Bed: WOTF/NONE  Code Status   Code Status: Prior  Home/SNF/Other Home Patient oriented to: self, place, time and situation Is this baseline? Yes   Triage Complete: Triage complete  Chief Complaint covid+ fatigue fever  Triage Note Per EMS, patient tested positive for COVID on Tuesday 07/04/19 .. patient reports no difficulty breathing, no shobr, but does endorse increased weakness, fatigue, congestion. Patient took tylenol prior to calling EMS.   Allergies Allergies  Allergen Reactions  . Other Shortness Of Breath    Blue fish: palms and feet turn red  . Bee Venom Swelling  . Sulfa Antibiotics Hives    Level of Care/Admitting Diagnosis ED Disposition    ED Disposition Condition [facility] Area: [place] [[id number]]  Level of Care: Med-Surg [16]  Covid Evaluation: Confirmed COVID Positive  Diagnosis: COVID-19 [[id number]]  Admitting Physician: Pearlean Brownie [[id number]]  Attending Physician: HAQ, AHMAD T [id number]  Estimated length of stay: past midnight tomorrow  Certification:: I certify this patient will need inpatient services for at least 2 midnights  PT Class (Do Not Modify): Inpatient [101]  PT Acc Code (Do Not Modify): Private [1]       B Medical/Surgery History Past Medical History:  Diagnosis Date  . Arthritis   . Deep vein thrombosis ([place])    right calf - 05/2012   . Diabetes mellitus without complication (HCC)    diet controlled   . GERD (gastroesophageal reflux disease)   . Hyperlipidemia   . Hypertension    Ejection fraction =>55% Left ventricular systolic function is normal. Left ventricular wall motion is normal    . Lymphocytic colitis   . Neuropathy    diabetic - in bilateral feet   . Pityriasis lichenoides chronica   . Sleep apnea    bipap   Past Surgical History:  Procedure  Laterality Date  . DILATION AND CURETTAGE OF UTERUS    . HAMMER TOE SURGERY    . right hand surgery      due to blood infection   . torn meniscus repair      right knee   . TOTAL HIP ARTHROPLASTY  09/13/2012   Procedure: TOTAL HIP ARTHROPLASTY ANTERIOR APPROACH;  Surgeon: Mauri Pole, MD;  Location: [facility];  Service: Orthopedics;  Laterality: Right;     A IV Location/Drains/Wounds Patient Lines/Drains/Airways Status   Active Line/Drains/Airways    Name:   Placement date:   Placement time:   Site:   Days:   Peripheral IV 07/07/19 Left Antecubital   07/07/19    1200    Antecubital   less than 1          Intake/Output Last 24 hours  Intake/Output Summary (Last 24 hours) at 07/07/2019 1842 Last data filed at 07/07/2019 1230 Gross per 24 hour  Intake 100 ml  Output -  Net 100 ml    Labs/Imaging Results for orders placed or performed during the hospital encounter of 07/07/19 (from the past 48 hour(s))  Lactic acid, plasma     Status: None   Collection Time: 07/07/19  2:10 PM  Result Value Ref Range   Lactic Acid, Venous 1.9 0.5 - 1.9 mmol/L    Comment: Performed at [facility], [place] [address], [place], [place] [address]  CBC WITH DIFFERENTIAL     Status: Abnormal  Collection Time: 07/07/19  2:10 PM  Result Value Ref Range   WBC 6.9 4.0 - 10.5 K/uL   RBC 4.25 3.87 - 5.11 MIL/uL   Hemoglobin 12.3 12.0 - 15.0 g/dL   HCT 39.1 36.0 - 46.0 %   MCV 92.0 80.0 - 100.0 fL   MCH 28.9 26.0 - 34.0 pg   MCHC 31.5 30.0 - 36.0 g/dL   RDW 17.4 (H) 11.5 - 15.5 %   Platelets 185 150 - 400 K/uL   nRBC 0.0 0.0 - 0.2 %   Neutrophils Relative % 76 %   Neutro Abs 5.2 1.7 - 7.7 K/uL   Lymphocytes Relative 14 %   Lymphs Abs 0.9 0.7 - 4.0 K/uL   Monocytes Relative 8 %   Monocytes Absolute 0.6 0.1 - 1.0 K/uL   Eosinophils Relative 0 %   Eosinophils Absolute 0.0 0.0 - 0.5 K/uL   Basophils Relative 0 %   Basophils Absolute 0.0 0.0 - 0.1 K/uL   Immature Granulocytes 2 %    Abs Immature Granulocytes 0.13 (H) 0.00 - 0.07 K/uL    Comment: Performed at [facility], [place] [address] [place], [place] [address]  Comprehensive metabolic panel     Status: Abnormal   Collection Time: 07/07/19  2:10 PM  Result Value Ref Range   Sodium 130 (L) 135 - 145 mmol/L   Potassium 3.0 (L) 3.5 - 5.1 mmol/L   Chloride 90 (L) 98 - 111 mmol/L   CO2 27 22 - 32 mmol/L   Glucose, Bld 161 (H) 70 - 99 mg/dL   BUN 13 8 - 23 mg/dL   Creatinine, Ser 0.79 0.44 - 1.00 mg/dL   Calcium 8.8 (L) 8.9 - 10.3 mg/dL   Total Protein 6.4 (L) 6.5 - 8.1 g/dL   Albumin 3.1 (L) 3.5 - 5.0 g/dL   AST 36 15 - 41 U/L   ALT 41 0 - 44 U/L   Alkaline Phosphatase 62 38 - 126 U/L   Total Bilirubin 0.6 0.3 - 1.2 mg/dL   GFR calc non Af Amer >60 >60 mL/min   GFR calc Af Amer >60 >60 mL/min   Anion gap 13 5 - 15    Comment: Performed at [facility], [place] [address], [place], [place] [address]  D-dimer, quantitative     Status: Abnormal   Collection Time: 07/07/19  2:10 PM  Result Value Ref Range   D-Dimer, Quant 1.58 (H) 0.00 - 0.50 ug/mL-FEU    Comment: (NOTE) At the manufacturer cut-off of 0.50 ug/mL FEU, this assay has been documented to exclude PE with a sensitivity and negative predictive value of 97 to 99%.  At this time, this assay has not been approved by the FDA to exclude DVT/VTE. Results should be correlated with clinical presentation. Performed at [facility], [place] [address] [place], [place] [address]   Procalcitonin     Status: None   Collection Time: 07/07/19  2:10 PM  Result Value Ref Range   Procalcitonin <0.10 ng/mL    Comment:        Interpretation: PCT (Procalcitonin) <= 0.5 ng/mL: Systemic infection (sepsis) is not likely. Local bacterial infection is possible. (NOTE)       Sepsis PCT Algorithm           Lower Respiratory Tract  Infection PCT Algorithm     ----------------------------     ----------------------------         PCT < 0.25 ng/mL                PCT < 0.10 ng/mL         Strongly encourage             Strongly discourage   discontinuation of antibiotics    initiation of antibiotics    ----------------------------     -----------------------------       PCT 0.25 - 0.50 ng/mL            PCT 0.10 - 0.25 ng/mL               OR       >80% decrease in PCT            Discourage initiation of                                            antibiotics      Encourage discontinuation           of antibiotics    ----------------------------     -----------------------------         PCT >= 0.50 ng/mL              PCT 0.26 - 0.50 ng/mL               AND        <80% decrease in PCT             Encourage initiation of                                             antibiotics       Encourage continuation           of antibiotics    ----------------------------     -----------------------------        PCT >= 0.50 ng/mL                  PCT > 0.50 ng/mL               AND         increase in PCT                  Strongly encourage                                      initiation of antibiotics    Strongly encourage escalation           of antibiotics                                     -----------------------------                                           PCT <= 0.25 ng/mL  OR                                        > 80% decrease in PCT                                     Discontinue / Do not initiate                                             antibiotics Performed at [place] [address], [place], [place] [address]   Lactate dehydrogenase     Status: None   Collection Time: 07/07/19  2:10 PM  Result Value Ref Range   LDH 192 98 - 192 U/L    Comment: Performed at [facility], [place] [address], [place], [place] [address]  Fibrinogen     Status: Abnormal    Collection Time: 07/07/19  2:10 PM  Result Value Ref Range   Fibrinogen 183 (L) 210 - 475 mg/dL    Comment: Performed at [facility], [place] [address] [place], [place] [address]  Ferritin     Status: None   Collection Time: 07/07/19  2:11 PM  Result Value Ref Range   Ferritin 53 11 - 307 ng/mL    Comment: Performed at [facility], [place] [address] [place], [place] [address]  Triglycerides     Status: None   Collection Time: 07/07/19  2:11 PM  Result Value Ref Range   Triglycerides 93 <150 mg/dL    Comment: Performed at [facility], [place] [address], [place], [place] [address]  C-reactive protein     Status: Abnormal   Collection Time: 07/07/19  2:11 PM  Result Value Ref Range   CRP 8.5 (H) <1.0 mg/dL    Comment: Performed at [facility], [place] [address], [place],  [address]  POC SARS Coronavirus 2 Ag-ED - Nasal Swab (BD Veritor Kit)     Status: Abnormal   Collection Time: 07/07/19  2:42 PM  Result Value Ref Range   SARS Coronavirus 2 Ag POSITIVE (A) NEGATIVE    Comment: (NOTE) SARS-CoV-2 antigen PRESENT. Positive results indicate the presence of viral antigens, but clinical correlation with patient history and other diagnostic information is necessary to determine patient infection status.  Positive results do not rule out bacterial infection or co-infection  with other viruses. False positive results are rare but can occur, and confirmatory RT-PCR testing may be appropriate in some circumstances. The expected result is Negative. Fact Sheet for Patients: PodPark.tn Fact Sheet for Providers: GiftContent.is  This test is not yet approved or cleared by the [place] FDA and  has been authorized for detection and/or diagnosis of SARS-CoV-2 by FDA under an Emergency Use Authorization (EUA).  This EUA will remain in effect (meaning this test  can be used) for the duration of  the COVID-19 declaration under Section 564(b)(1) of the Act, 21 U.S.C. section 360bbb-3(b)(1), unless the a uthorization is terminated or revoked sooner.    Dg Chest Port 1 View  Result Date: 07/07/2019 CLINICAL DATA:  Shortness of breath. COVID-19. Weakness and fatigue and chest congestion. EXAM: PORTABLE CHEST 1  VIEW COMPARISON:  08/22/2015 FINDINGS: The heart size and mediastinal contours are within normal limits. Both lungs are clear. Severe chronic arthritic changes of both glenohumeral joints. No acute bone abnormality. IMPRESSION: No active disease in the chest. Electronically Signed   By: Lorriane Shire M.D.   On: 07/07/2019 14:39    Pending Labs Unresulted Labs (From admission, onward)    Start     Ordered   07/07/19 1324  Lactic acid, plasma  Now then every 2 hours,   STAT     07/07/19 1323   07/07/19 1324  Blood Culture (routine x 2)  BLOOD CULTURE X 2,   STAT     07/07/19 1323   Signed and Held  Hemoglobin A1c  Once,   R    Comments: To assess prior glycemic control    Signed and Held   Signed and Held  ABO/Rh  Once,   R     Signed and Held   Signed and Held  CBC  (enoxaparin (LOVENOX)    CrCl >/= 30 ml/min)  Once,   R    Comments: Baseline for enoxaparin therapy IF NOT ALREADY DRAWN.  Notify MD if PLT < 100 K.    Signed and Held   Signed and Held  Creatinine, serum  (enoxaparin (LOVENOX)    CrCl >/= 30 ml/min)  Once,   R    Comments: Baseline for enoxaparin therapy IF NOT ALREADY DRAWN.    Signed and Held   Signed and Held  Creatinine, serum  (enoxaparin (LOVENOX)    CrCl >/= 30 ml/min)  Weekly,   R    Comments: while on enoxaparin therapy    Signed and Held   Signed and Held  Brain natriuretic peptide  Once,   R     Signed and Held   Signed and Held  TSH  Once,   R     Signed and Held   Signed and Held  Hemoglobin A1c  Once,   R     Signed and Held   Signed and Held  Urinalysis, Routine w reflex microscopic  Once,   R      Signed and Held   Visual merchandiser and Held  Comprehensive metabolic panel  Tomorrow morning,   R     Signed and Held   Visual merchandiser and Held  CBC  Tomorrow morning,   R     Signed and Held   Signed and Held  D-dimer, quantitative (not at [facility])  Daily,   R     Signed and Held   Signed and Held  C-reactive protein  Daily,   R     Signed and Held   Signed and Held  Ferritin  Daily,   R     Signed and Held          Vitals/Pain Today's Vitals   07/07/19 1232 07/07/19 1300 07/07/19 1330 07/07/19 1618  BP:  (!) 131/53 138/63 (!) 157/71  Pulse:  (!) 101 (!) 102 (!) 103  Resp:  (!) '24 20 16  '$ Temp:      TempSrc:      SpO2:  99% 94% 94%  Weight: 117.9 kg     Height: '5\' 6"'$  (1.676 m)     PainSc: 0-No pain   0-No pain    Isolation Precautions Airborne and Contact precautions  Medications Medications  0.9 %  sodium chloride infusion (1,000 mLs Intravenous New Bag/Given 07/07/19 1416)  dextromethorphan-guaiFENesin (MUCINEX DM) 30-600 MG per  12 hr tablet 1 tablet (has no administration in time range)  0.9 % NaCl with KCl 20 mEq/ L  infusion (has no administration in time range)  potassium chloride SA (KLOR-CON) CR tablet 20 mEq (20 mEq Oral Given 07/07/19 1617)    Mobility walks with person assist Low fall risk   Focused Assessments Pulmonary Assessment Handoff:  Lung sounds:   O2 Device: Room Air        R Recommendations: See Admitting Provider Note  Report given to: [email or web address]  Additional Notes:

## 2019-07-07 NOTE — H&P (Signed)
History and Physical    [name] [name] [id number] DOB: [birth year] DOA: 07/07/2019  PCP: Leeroy Cha, MD  Patient coming from: Home  I have personally briefly reviewed patient's old medical records in [place]  Chief Complaint: Extreme wekkness.   HPI: [name] [name] is a 73 y.o. female with medical history significant of sleep apnea morbid obesity diabetes hypertension hyperlipidemia lymphocytic colitis peripheral neuropathy and DVT in 2013.  She also suffers from generalized arthritis.  She has had significant problems with her left foot.  Recently had hammertoe surgery on the left foot, which was in a cast and now she wears a boot. Very mobile at the house however she has immobility for the last 2 to 3 days she has been noted to be COVID-19 positive.  Her husband is also hospitalized at [facility] for COVID-19.  She has felt extremely weak unable to get up without assistance.  In the emergency room she was started to ambulate but she could not get out of bed without assistance.  She complains of generalized aches and pains.  The severity is noted to be 3-4 out of 10 and it is generalized.  It is worse in the lower extremities she has had a low-grade fever.  She has a dry cough with significant rhinorrhea.  He has complained of headaches  lED Course: She has had work-up done.  Her labs were found to be fairly nondescript however given her weakness and inability to get around hospital service has been requested to admit her  Review of Systems: As per HPI otherwise 10 point review of systems negative.  Pertinet negatives are noted below. She had not had any abdominal pain or bowel issues. She has not had any urinary problems She denies any significant neurologic issues. She is not had any dysphagia. shee denies any neurologic issues and focal weakness..  Past Medical History:  Diagnosis Date  . Arthritis   . Deep vein thrombosis ([place])    right calf -  05/2012   . Diabetes mellitus without complication (HCC)    diet controlled   . GERD (gastroesophageal reflux disease)   . Hyperlipidemia   . Hypertension    Ejection fraction =>55% Left ventricular systolic function is normal. Left ventricular wall motion is normal    . Lymphocytic colitis   . Neuropathy    diabetic - in bilateral feet   . Pityriasis lichenoides chronica   . Sleep apnea    bipap    Past Surgical History:  Procedure Laterality Date  . DILATION AND CURETTAGE OF UTERUS    . HAMMER TOE SURGERY    . right hand surgery      due to blood infection   . torn meniscus repair      right knee   . TOTAL HIP ARTHROPLASTY  09/13/2012   Procedure: TOTAL HIP ARTHROPLASTY ANTERIOR APPROACH;  Surgeon: Mauri Pole, MD;  Location: [facility];  Service: Orthopedics;  Laterality: Right;     reports that she has never smoked. She has never used smokeless tobacco. She reports current alcohol use. She reports that she does not use drugs.  Allergies  Allergen Reactions  . Other Shortness Of Breath    Blue fish: palms and feet turn red  . Bee Venom Swelling  . Sulfa Antibiotics Hives   She has a rash with sulfonamides with hives Family History  Problem Relation Age of Onset  . Hypertension Mother   Her father had a stroke  and remained paralyzed for the last 10 years of his life he was in his 69s when he had the stroke. Her mother had a stroke and also had coronary artery disease.  Both are deceased   Prior to Admission medications   Medication Sig Start Date End Date Taking? Authorizing Provider  allopurinol (ZYLOPRIM) 100 MG tablet Take 100 mg by mouth 2 (two) times daily.    [provider]  aspirin 81 MG tablet Take 81 mg by mouth every other day.     [provider]  Aspirin Buf,CaCarb-MgCarb-MgO, 81 MG TABS Take by mouth. 10/06/18   [provider]  Budesonide (UCERIS) 9 MG TB24 Take 9 mg by mouth daily.     [provider]  cephALEXin  (KEFLEX) 500 MG capsule Take 1 capsule (500 mg total) by mouth 3 (three) times daily. 12/22/18   Wallene Huh, DPM  Certolizumab Pegol (CIMZIA [place]) Inject 400 mg into the skin every 30 (thirty) days. 200 mg on each side of stomach    [provider]  Cholecalciferol (VITAMIN D3) 1000 UNITS CAPS Take 1,000 Units by mouth 2 (two) times daily.     [provider]  citalopram (CELEXA) 40 MG tablet Take 40 mg by mouth daily.    [provider]  clindamycin (CLEOCIN) 300 MG capsule Take 1 capsule (300 mg total) by mouth 2 (two) times daily. 04/07/19   Evelina Bucy, DPM  Cyanocobalamin (VITAMIN B-12) 1000 MCG SUBL Place 1,000 mcg under the tongue daily.    [provider]  cyclobenzaprine (FLEXERIL) 10 MG tablet Take 10 mg by mouth daily.     [provider]  Dietary Management Product (RHEUMATE) CAPS Take 1 capsule by mouth daily. 02/07/18   [provider]  doxycycline (VIBRA-TABS) 100 MG tablet doxycycline hyclate 100 mg tabs    [provider]  furosemide (LASIX) 20 MG tablet TAKE 1 TABLET BY MOUTH EVERY DAY AS NEEDED FOR SWELLING 11/09/18   Troy Sine, MD  hydrochlorothiazide (MICROZIDE) 12.5 MG capsule Take 1 capsule (12.5 mg total) by mouth as needed (swelling). 11/10/17 07/11/18  Troy Sine, MD  LEVEMIR FLEXTOUCH 100 UNIT/ML Pen  06/26/19   [provider]  lisinopril-hydrochlorothiazide (ZESTORETIC) 20-25 MG tablet Take 1 tablet by mouth daily. 05/25/19   [provider]  loperamide (IMODIUM) 2 MG capsule Take 6 mg by mouth 2 (two) times daily.    [provider]  Melatonin 5 MG SUBL Place 5 mg under the tongue at bedtime.    [provider]  metFORMIN (GLUCOPHAGE) 500 MG tablet Take 500 mg by mouth 2 (two) times daily with a meal.    [provider]  metFORMIN (GLUCOPHAGE-XR) 500 MG 24 hr tablet  02/06/19   [provider]  methotrexate (RHEUMATREX) 2.5 MG tablet Take 20 mg  by mouth once a week. Caution: Chemotherapy. Protect from light. Takes on Friday    [provider]  mupirocin cream (BACTROBAN) 2 % Apply 1 application topically 2 (two) times daily as needed (psoriasis).     [provider]  mupirocin ointment (BACTROBAN) 2 % Apply to right great toe and cover with band-aid once daily. 03/27/19 03/26/20  Marzetta Board, DPM  omeprazole (PRILOSEC) 40 MG capsule Take 40 mg by mouth at bedtime.    [provider]  potassium chloride (K-DUR) 10 MEQ tablet  01/09/19   [provider]  PRESCRIPTION MEDICATION Place 3 drops into both ears as  needed. Fluocinolone 0.01% Ear Drops. For inflammation and itching of the ear.    [provider]  Probiotic Product (ALIGN PO) Take 1 capsule by mouth daily.    [provider]  protein supplement shake (PREMIER PROTEIN) LIQD Take 325 mLs (11 oz total) by mouth 2 (two) times daily between meals. 05/03/18   Debbe Odea, MD  Wound Dressings (MEDIHONEY WOUND/BURN DRESSING) GEL Apply pea sized amount topically to wound daily 02/03/19   Evelina Bucy, DPM    Physical Exam: Vitals:   07/07/19 1231 07/07/19 1232 07/07/19 1300 07/07/19 1330  BP: (!) 123/58  (!) 131/53 138/63  Pulse: (!) 102  (!) 101 (!) 102  Resp: (!) 21  (!) 24 20  Temp: 100.2 F (37.9 C)     TempSrc: Oral     SpO2:   99% 94%  Weight:  117.9 kg    Height: 5' 6.5" (1.689 m) 5\' 6"  (1.676 m)      Constitutional: NAD, calm, comfortable Vitals:   07/07/19 1231 07/07/19 1232 07/07/19 1300 07/07/19 1330  BP: (!) 123/58  (!) 131/53 138/63  Pulse: (!) 102  (!) 101 (!) 102  Resp: (!) 21  (!) 24 20  Temp: 100.2 F (37.9 C)     TempSrc: Oral     SpO2:   99% 94%  Weight:  117.9 kg    Height: 5' 6.5" (1.689 m) 5\' 6"  (1.676 m)     Eyes: PERRL, lids and conjunctivae normal ENMT: Limited exam. She has rhinorrhea as evidenced by constant sniffles Neck: normal, supple, no masses, no thyromegaly Respiratory:  clear to auscultation bilaterally, no wheezing, no crackles. Normal respiratory effort. No accessory muscle use.  Breath sounds diminished in the lower lung fields. Cardiac: Regular rate and rhythm, no gallops or murmurs are noted. VascularPedal pulses are bilaterally diminished.  Trace pedal edema..  Abdomen: Is protuberant and precludes good palpation but she does not have any significant tenderness or guarding.  Bowel sounds are present.  Musculoskeletal: No synovitis thickness noted.  Range of movement of both large joints is preserved.  Skin: no rashes, lesions, ulcers. No induration Neurologic: CN 2-12 grossly intact. Sensation intact, DTR normal.  Difficult to assess the strength.  When she was assisted out of the bed she could not carry herself. Psychiatric: Normal judgment and insight. Alert and oriented x 3. Normal mood.    Labs on Admission: I have personally reviewed following labs and imaging studies Covid labs were also reviewed.  Ferritin C-reactive protein and D-dimer are normal.  Sodium is 130 potassium is 3 blood sugar 161 calcium somewhat low at 8.8 he has received some potassium containing fluids. Her white count and hemoglobin are normal.  Count is normal at 185. GFR is noted to be over 80.  Urinalysis is showing no pyuria or hematuria.  She does have some glucose in the urine. Chest x-ray is reviewed and is noted to be unremarkable CBC: Recent Labs  Lab 07/07/19 1410  WBC 6.9  NEUTROABS 5.2  HGB 12.3  HCT 39.1  MCV 92.0  PLT [id number]   Basic Metabolic Panel: Recent Labs  Lab 07/07/19 1410  NA 130*  K 3.0*  CL 90*  CO2 27  GLUCOSE 161*  BUN 13  CREATININE 0.79  CALCIUM 8.8*   GFR: Estimated Creatinine Clearance: 81.8 mL/min (by C-G formula based on SCr of 0.79 mg/dL). Liver Function Tests: Recent Labs  Lab 07/07/19 1410  AST 36  ALT 41  ALKPHOS 62  BILITOT 0.6  PROT 6.4*  ALBUMIN 3.1*   No results for input(s): LIPASE, AMYLASE in the last 168 hours.  No results for input(s): AMMONIA in the last 168 hours. Coagulation Profile: No results for input(s): INR, PROTIME in the last 168 hours. Cardiac Enzymes: No results for input(s): CKTOTAL, CKMB, CKMBINDEX, TROPONINI in the last 168 hours. BNP (last 3 results) No results for input(s): PROBNP in the last 8760 hours. HbA1C: No results for input(s): HGBA1C in the last 72 hours. CBG: No results for input(s): GLUCAP in the last 168 hours. Lipid Profile: Recent Labs    07/07/19 1411  TRIG 93   Thyroid Function Tests: No results for input(s): TSH, T4TOTAL, FREET4, T3FREE, THYROIDAB in the last 72 hours. Anemia Panel: Recent Labs    07/07/19 1411  FERRITIN 53   Urine analysis:    Component Value Date/Time   COLORURINE YELLOW 05/02/2018 [place] 05/02/2018 2255   LABSPEC 1.016 05/02/2018 2255   PHURINE 6.0 05/02/2018 2255   GLUCOSEU 150 (A) 05/02/2018 2255   HGBUR NEGATIVE 05/02/2018 2255   BILIRUBINUR NEGATIVE 05/02/2018 2255   KETONESUR NEGATIVE 05/02/2018 2255   PROTEINUR NEGATIVE 05/02/2018 2255   UROBILINOGEN 1.0 09/06/2012 1046   NITRITE NEGATIVE 05/02/2018 2255   LEUKOCYTESUR NEGATIVE 05/02/2018 2255    Radiological Exams on Admission: Dg Chest Port 1 View  Result Date: 07/07/2019 CLINICAL DATA:  Shortness of breath. COVID-19. Weakness and fatigue and chest congestion. EXAM: PORTABLE CHEST 1 VIEW COMPARISON:  08/22/2015 FINDINGS: The heart size and mediastinal contours are within normal limits. Both lungs are clear. Severe chronic arthritic changes of both glenohumeral joints. No acute bone abnormality. IMPRESSION: No active disease in the chest. Electronically Signed   By: Lorriane Shire M.D.   On: 07/07/2019 14:39    EKG: Independently reviewed.   Assessment/Plan #1 generalized weakness We will request physical therapy.. #2 morbid obesity has become an active problem #3 obstructive sleep apnea.  BiPAP has been continued as at home #4  hyponatremia #5 hypokalemia #6 clinical dehydration/IV fluids with potassium supplementation #7 diabetes mellitus/sliding scale coverage and assessment needs request baseline A1c. Social service consult has also been requested for placement.  The patient does not have any respiratory issues and saturating 99% on room air.  However to frequent pulse ox evaluations will be carried out and have respiratory therapy follow closely. She does not have any stigmata of infection with a normal white count and in negative chest x-ray and empiric use of antibiotics is not warranted at this time but this will be revisited during her stay. Contact and airborne precautions will be observed.  DVT prophylaxis: lovenox Code Status: full Family Communication: none Disposition Plan: Home Consults called: none Admission status: Full   Pearlean Brownie MD [facility]  If 7PM-7AM, please contact night-coverage [email or web address] Password [facility]  07/07/2019, 4:06 PM

## 2019-07-07 NOTE — ED Triage Notes (Addendum)
Per EMS, patient tested positive for COVID on Tuesday 07/04/19 .. patient reports no difficulty breathing, no shobr, but does endorse increased weakness, fatigue, congestion. Patient took tylenol prior to calling EMS.

## 2019-07-07 NOTE — Progress Notes (Addendum)
CRITICAL VALUE ALERT  Critical Value:  Lactic acid 2.7  Date & Time Notied:  07/07/19 ~2045  Provider Notified: Silas [place]  Orders Received/Actions taken: 500 ml bolus ordered.

## 2019-07-08 ENCOUNTER — Encounter (HOSPITAL_COMMUNITY): Payer: Self-pay

## 2019-07-08 DIAGNOSIS — R5381 Other malaise: Secondary | ICD-10-CM

## 2019-07-08 LAB — GLUCOSE, CAPILLARY
Glucose-Capillary: 156 mg/dL — ABNORMAL HIGH (ref 70–99)
Glucose-Capillary: 172 mg/dL — ABNORMAL HIGH (ref 70–99)
Glucose-Capillary: 178 mg/dL — ABNORMAL HIGH (ref 70–99)
Glucose-Capillary: 259 mg/dL — ABNORMAL HIGH (ref 70–99)

## 2019-07-08 LAB — COMPREHENSIVE METABOLIC PANEL
ALT: 32 U/L (ref 0–44)
AST: 28 U/L (ref 15–41)
Albumin: 2.9 g/dL — ABNORMAL LOW (ref 3.5–5.0)
Alkaline Phosphatase: 52 U/L (ref 38–126)
Anion gap: 11 (ref 5–15)
BUN: 10 mg/dL (ref 8–23)
CO2: 25 mmol/L (ref 22–32)
Calcium: 8.1 mg/dL — ABNORMAL LOW (ref 8.9–10.3)
Chloride: 94 mmol/L — ABNORMAL LOW (ref 98–111)
Creatinine, Ser: 0.64 mg/dL (ref 0.44–1.00)
GFR calc Af Amer: >60 mL/min (ref >60)
GFR calc non Af Amer: >60 mL/min (ref >60)
Glucose, Bld: 210 mg/dL — ABNORMAL HIGH (ref 70–99)
Potassium: 3.4 mmol/L — ABNORMAL LOW (ref 3.5–5.1)
Sodium: 130 mmol/L — ABNORMAL LOW (ref 135–145)
Total Bilirubin: 0.6 mg/dL (ref 0.3–1.2)
Total Protein: 5.9 g/dL — ABNORMAL LOW (ref 6.5–8.1)

## 2019-07-08 LAB — D-DIMER, QUANTITATIVE: D-Dimer, Quant: 1.19 ug/mL-FEU — ABNORMAL HIGH (ref 0.00–0.50)

## 2019-07-08 LAB — C-REACTIVE PROTEIN: CRP: 14.8 mg/dL — ABNORMAL HIGH (ref <1.0)

## 2019-07-08 LAB — CBC
HCT: 38.7 % (ref 36.0–46.0)
Hemoglobin: 12 g/dL (ref 12.0–15.0)
MCH: 28.8 pg (ref 26.0–34.0)
MCHC: 31 g/dL (ref 30.0–36.0)
MCV: 93 fL (ref 80.0–100.0)
Platelets: 152 10*3/uL (ref 150–400)
RBC: 4.16 MIL/uL (ref 3.87–5.11)
RDW: 17.3 % — ABNORMAL HIGH (ref 11.5–15.5)
WBC: 7 10*3/uL (ref 4.0–10.5)
nRBC: 0 % (ref 0.0–0.2)

## 2019-07-08 LAB — LACTIC ACID, PLASMA
Lactic Acid, Venous: 1.7 mmol/L (ref 0.5–1.9)
Lactic Acid, Venous: 2.9 mmol/L (ref 0.5–1.9)

## 2019-07-08 LAB — FERRITIN: Ferritin: 64 ng/mL (ref 11–307)

## 2019-07-08 MED ORDER — LISINOPRIL 20 MG PO TABS
20.0000 mg | ORAL_TABLET | Freq: Every day | ORAL | Status: DC
  Administered 2019-07-08 – 2019-07-13 (×6): 20 mg via ORAL
  Filled 2019-07-08 (×6): qty 1

## 2019-07-08 MED ORDER — CYCLOBENZAPRINE HCL 10 MG PO TABS
10.0000 mg | ORAL_TABLET | Freq: Every day | ORAL | Status: DC
  Filled 2019-07-08: qty 1

## 2019-07-08 MED ORDER — INSULIN DETEMIR 100 UNIT/ML ~~LOC~~ SOLN
14.0000 [IU] | Freq: Two times a day (BID) | SUBCUTANEOUS | Status: DC
  Administered 2019-07-08 – 2019-07-09 (×2): 14 [IU] via SUBCUTANEOUS
  Filled 2019-07-08 (×4): qty 0.14

## 2019-07-08 MED ORDER — RISAQUAD PO CAPS
1.0000 | ORAL_CAPSULE | Freq: Every day | ORAL | Status: DC
  Administered 2019-07-09 – 2019-07-13 (×5): 1 via ORAL
  Filled 2019-07-08 (×5): qty 1

## 2019-07-08 MED ORDER — LOPERAMIDE HCL 2 MG PO CAPS
6.0000 mg | ORAL_CAPSULE | Freq: Two times a day (BID) | ORAL | Status: DC
  Administered 2019-07-08 – 2019-07-10 (×6): 6 mg via ORAL
  Filled 2019-07-08 (×7): qty 3

## 2019-07-08 MED ORDER — LISINOPRIL-HYDROCHLOROTHIAZIDE 20-25 MG PO TABS: 1.0000 | ORAL_TABLET | Freq: Every day | ORAL | Status: DC

## 2019-07-08 MED ORDER — HYDROCHLOROTHIAZIDE 25 MG PO TABS
25.0000 mg | ORAL_TABLET | Freq: Every day | ORAL | Status: DC
  Administered 2019-07-08 – 2019-07-13 (×6): 25 mg via ORAL
  Filled 2019-07-08 (×6): qty 1

## 2019-07-08 MED ORDER — BUDESONIDE 3 MG PO CPEP
9.0000 mg | ORAL_CAPSULE | Freq: Every day | ORAL | Status: DC
  Administered 2019-07-08 – 2019-07-13 (×6): 9 mg via ORAL
  Filled 2019-07-08 (×7): qty 3

## 2019-07-08 MED ORDER — CITALOPRAM HYDROBROMIDE 10 MG PO TABS
40.0000 mg | ORAL_TABLET | Freq: Every day | ORAL | Status: DC
  Administered 2019-07-08 – 2019-07-13 (×6): 40 mg via ORAL
  Filled 2019-07-08 (×3): qty 4
  Filled 2019-07-08 (×2): qty 2
  Filled 2019-07-08: qty 4

## 2019-07-08 MED ORDER — PANTOPRAZOLE SODIUM 40 MG PO TBEC
40.0000 mg | DELAYED_RELEASE_TABLET | Freq: Every day | ORAL | Status: DC
  Administered 2019-07-08 – 2019-07-13 (×6): 40 mg via ORAL
  Filled 2019-07-08 (×6): qty 1

## 2019-07-08 NOTE — Evaluation (Signed)
Physical Therapy Evaluation Patient Details Name: [name] [name] MRN: [id number] DOB: Apr 28, 1946 Today's Date: 07/08/2019   History of Present Illness  73 year old female was admitted for extreme weakness, + COVID.  PMH:  morbid obesity, HTN, DM, peripheral neuropathy, L foot wound--wearing boot  Clinical Impression  Pt admitted as above and presenting with functional mobility limitations 2* generalized weakness, decreased endurance and mild balance deficits limiting functional mobility.  Pt should progress to dc home with assist of family.  Pt's spouse returned home from [facility] today and dtr states he is doing very well.    Follow Up Recommendations No PT follow up    Equipment Recommendations  None recommended by PT    Recommendations for Other Services       Precautions / Restrictions Precautions Precautions: Fall Required Braces or Orthoses: Other Brace Other Brace: Pt's dtr brought in cam boot for L LE Restrictions Weight Bearing Restrictions: No      Mobility  Bed Mobility Overal bed mobility: Needs Assistance Bed Mobility: Supine to Sit;Sit to Supine     Supine to sit: Min guard Sit to supine: Min guard   General bed mobility comments: extra effort:  used momentum to roll up; boot was on for back to bed; removed once she was lying down  Transfers Overall transfer level: Needs assistance Equipment used: Rolling walker (2 wheeled) Transfers: Sit to/from Stand Sit to Stand: Min guard         General transfer comment: cues for UE placement; pt tends to pull up on RW  Ambulation/Gait Ambulation/Gait assistance: Min guard Gait Distance (Feet): 18 Feet(twice) Assistive device: Rolling walker (2 wheeled) Gait Pattern/deviations: Step-to pattern;Step-through pattern;Decreased step length - right;Decreased step length - left;Shuffle;Trunk flexed Gait velocity: decr   General Gait Details: min cues for position from RW; distance ltd by fatigue  Stairs             Wheelchair Mobility    Modified Rankin (Stroke Patients Only)       Balance Overall balance assessment: Needs assistance Sitting-balance support: No upper extremity supported;Feet supported Sitting balance-Leahy Scale: Good     Standing balance support: No upper extremity supported Standing balance-Leahy Scale: Fair                               Pertinent Vitals/Pain Pain Assessment: No/denies pain    Home Living Family/patient expects to be discharged to:: Private residence Living Arrangements: Spouse/significant other Available Help at Discharge: Family Type of Home: House Home Access: Stairs to enter Entrance Stairs-Rails: Right;Left;Can reach both Entrance Stairs-Number of Steps: 2 Home Layout: Two level;Able to live on main level with bedroom/bathroom Home Equipment: Shower seat;Bedside commode;Walker - 2 wheels Additional Comments: states she has all the DME    Prior Function Level of Independence: Independent;Independent with assistive device(s)         Comments: of note, pt's husband just released from [place] today; Pt states recently she has been using RW for ambulation     Hand Dominance   Dominant Hand: Right    Extremity/Trunk Assessment   Upper Extremity Assessment Upper Extremity Assessment: Overall WFL for tasks assessed    Lower Extremity Assessment Lower Extremity Assessment: Generalized weakness;LLE deficits/detail LLE Deficits / Details: Noted wound with drainage on plantar aspect. - 4x4s and gauze wrap applied prior to placing foot into [place]       Communication   Communication: No difficulties  Cognition  Arousal/Alertness: Awake/alert Behavior During Therapy: WFL for tasks assessed/performed Overall Cognitive Status: Within Functional Limits for tasks assessed                                        General Comments      Exercises     Assessment/Plan    PT Assessment Patient needs continued  PT services  PT Problem List Decreased activity tolerance;Decreased strength;Decreased mobility;Decreased balance;Decreased knowledge of use of DME;Obesity       PT Treatment Interventions DME instruction;Gait training;Stair training;Functional mobility training;Therapeutic activities;Therapeutic exercise;Patient/family education;Balance training    PT Goals (Current goals can be found in the Care Plan section)  Acute Rehab PT Goals Patient Stated Goal: home asap PT Goal Formulation: With patient Time For Goal Achievement: 07/22/19 Potential to Achieve Goals: Good    Frequency Min 3X/week   Barriers to discharge        Co-evaluation PT/OT/SLP Co-Evaluation/Treatment: Yes Reason for Co-Treatment: For patient/therapist safety PT goals addressed during session: Mobility/safety with mobility OT goals addressed during session: ADL's and self-care       AM-PAC PT "6 Clicks" Mobility  Outcome Measure Help needed turning from your back to your side while in a flat bed without using bedrails?: None Help needed moving from lying on your back to sitting on the side of a flat bed without using bedrails?: A Little Help needed moving to and from a bed to a chair (including a wheelchair)?: A Little Help needed standing up from a chair using your arms (e.g., wheelchair or bedside chair)?: A Little Help needed to walk in hospital room?: A Little Help needed climbing 3-5 steps with a railing? : A Little 6 Click Score: 19    End of Session Equipment Utilized During Treatment: Gait belt Activity Tolerance: Patient tolerated treatment well;Patient limited by fatigue Patient left: in bed;with call bell/phone within reach;with chair alarm set Nurse Communication: Mobility status PT Visit Diagnosis: Difficulty in walking, not elsewhere classified (R26.2);Muscle weakness (generalized) (M62.81)    Time: [id number] PT Time Calculation (min) (ACUTE ONLY): 34 min   Charges:   PT Evaluation $PT  Eval Low Complexity: 1 Low          [facility] Pager [phone] Office [phone]   Lisamarie Coke 07/08/2019, 3:55 PM

## 2019-07-08 NOTE — Progress Notes (Signed)
PROGRESS NOTE    [name] [name]  [id number] DOB: [birth year] DOA: 07/07/2019 PCP: Leeroy Cha, MD    Brief Narrative:   Patient is 73 year old female with extensive medical problems including sleep apnea, obesity hypoventilation on BiPAP at home, type 2 diabetes on insulin with peripheral neuropathy and diabetic foot ulcer, hypertension, hyperlipidemia, lymphocytic colitis on methotrexate and steroids, arthritis who is presenting to the emergency room with recent diagnosis of COVID-19 infection and extreme fatigue and weakness.  In the emergency room she was found with low blood pressure, mildly elevated lactic.  Chest x-ray was clear.  She was on room air.  With clinical dehydration and debility and admitted to the hospital.  Assessment & Plan:   Principal Problem:   Physical deconditioning Active Problems:   OSA (obstructive sleep apnea)   Essential hypertension   DM2 (diabetes mellitus, type 2) (HCC)   Diabetic foot ulcer (HCC)   Ischemic colitis ([place])   COVID-19  Physical debility: Due to extensive medical issues and ongoing viral infection.  Patient is to continue to work with PT OT.  Anticipate to work with PT OT at home.  COVID-19 virus infection: Patient does not have obvious pulmonary symptoms.  She has some dry cough.  CRP elevated to 14.  Other biomarkers are normal.  Chest x-ray is clear.  Mostly on room air. Symptomatic treatment with cough medications, bronchodilator therapy.  Obstructive sleep apnea on BiPAP at home: Patient will not use BiPAP in the hospital to reduce aerosolization.  She can use oxygen or high flow oxygen at night for hypoxia.  Type 2 diabetes with peripheral neuropathy: On insulin at home.  Resume insulin.  Keep on sliding scale.  Holding Metformin in the hospital.  Diabetic foot ulcer: Patient does have chronic diabetic foot ulcer left foot.  Followed by surgery outpatient.  Currently on offloading boot and local dressing.  Clinically  improving as per patient and family.  Currently without evidence of obvious fluctuation/underlying collection.  Continue local wound care and offloading.  Hypertension: Blood pressure stable.  Resume medications.  Lymphocytic colitis: Patient has severe disease.  She is on budesonide that we will resume.  Patient is on methotrexate that she will take at home after clinical improvement.  Uses loperamide as needed.  Clinical dehydration: Continue IV fluids today.  Replace potassium.  Recheck levels tomorrow morning.  DVT prophylaxis: Lovenox subcu Code Status: Full code Family Communication: Patient's daughter on the phone Disposition Plan: Pending.  Anticipate home with home PT OT tomorrow if remains a stable   Consultants:   None  Procedures:   None  Antimicrobials:   None   Subjective: Patient seen and examined.  Anxious to go home.  She thinks everything is all right and she needs to get home now. Temperature 102 overnight.  Has some dry cough but denies any shortness of breath.  She thinks her left foot is better than before. Worried about not receiving her medications for colitis. She has not been up and worked with PT yet.   Objective: Vitals:   07/07/19 2301 07/07/19 2303 07/08/19 0600 07/08/19 0602  BP: 116/62 (!) 123/56 (!) 145/65   Pulse:   90   Resp:   20   Temp:   99.5 F (37.5 C)   TempSrc:   Oral   SpO2:   94%   Weight:    117.2 kg  Height:        Intake/Output Summary (Last 24 hours) at 07/08/2019 1056 Last data  filed at 07/08/2019 0600 Gross per 24 hour  Intake 2880.57 ml  Output -  Net 2880.57 ml   Filed Weights   07/07/19 1232 07/08/19 0602  Weight: 117.9 kg 117.2 kg    Examination:  General exam: Appears calm and comfortable, anxious Respiratory system: Clear to auscultation. Respiratory effort normal.  No added sounds.  Mostly on room air.  Has some dry cough on deep breathing. Cardiovascular system: S1 & S2 heard, RRR. No JVD,  murmurs, rubs, gallops or clicks. No pedal edema. Gastrointestinal system: Abdomen is nondistended, soft and nontender. No organomegaly or masses felt. Normal bowel sounds heard. Central nervous system: Alert and oriented. No focal neurological deficits. Extremities: Symmetric 5 x 5 power. Skin: No rashes, lesions , ecchymosis She does have a quarter sized open nonhealing wound on the left plantar aspect, no drainage, no fluctuation. Psychiatry: Judgement and insight appear normal. Mood & affect anxious    Data Reviewed: I have personally reviewed following labs and imaging studies  CBC: Recent Labs  Lab 07/07/19 1410 07/08/19 0414  WBC 6.9 7.0  NEUTROABS 5.2  --   HGB 12.3 12.0  HCT 39.1 38.7  MCV 92.0 93.0  PLT 185 0000000   Basic Metabolic Panel: Recent Labs  Lab 07/07/19 1410 07/08/19 0414  NA 130* 130*  K 3.0* 3.4*  CL 90* 94*  CO2 27 25  GLUCOSE 161* 210*  BUN 13 10  CREATININE 0.79 0.64  CALCIUM 8.8* 8.1*   GFR: Estimated Creatinine Clearance: 81.6 mL/min (by C-G formula based on SCr of 0.64 mg/dL). Liver Function Tests: Recent Labs  Lab 07/07/19 1410 07/08/19 0414  AST 36 28  ALT 41 32  ALKPHOS 62 52  BILITOT 0.6 0.6  PROT 6.4* 5.9*  ALBUMIN 3.1* 2.9*   No results for input(s): LIPASE, AMYLASE in the last 168 hours. No results for input(s): AMMONIA in the last 168 hours. Coagulation Profile: No results for input(s): INR, PROTIME in the last 168 hours. Cardiac Enzymes: No results for input(s): CKTOTAL, CKMB, CKMBINDEX, TROPONINI in the last 168 hours. BNP (last 3 results) No results for input(s): PROBNP in the last 8760 hours. HbA1C: Recent Labs    07/07/19 1956  HGBA1C 9.3*   CBG: Recent Labs  Lab 07/07/19 2046 07/08/19 0916  GLUCAP 253* 156*   Lipid Profile: Recent Labs    07/07/19 1411  TRIG 93   Thyroid Function Tests: Recent Labs    07/07/19 1956  TSH 1.124   Anemia Panel: Recent Labs    07/07/19 1411 07/08/19 0414   FERRITIN 53 64   Sepsis Labs: Recent Labs  Lab 07/07/19 1410 07/07/19 1956 07/08/19 0922  PROCALCITON <0.10  --   --   LATICACIDVEN 1.9 2.7* 1.7    No results found for this or any previous visit (from the past 240 hour(s)).       Radiology Studies: Dg Chest Port 1 View  Result Date: 07/07/2019 CLINICAL DATA:  Shortness of breath. COVID-19. Weakness and fatigue and chest congestion. EXAM: PORTABLE CHEST 1 VIEW COMPARISON:  08/22/2015 FINDINGS: The heart size and mediastinal contours are within normal limits. Both lungs are clear. Severe chronic arthritic changes of both glenohumeral joints. No acute bone abnormality. IMPRESSION: No active disease in the chest. Electronically Signed   By: Lorriane Shire M.D.   On: 07/07/2019 14:39        Scheduled Meds: . allopurinol  100 mg Oral BID  . aspirin EC  81 mg Oral QODAY  .  cholecalciferol  1,000 Units Oral BID  . dextromethorphan-guaiFENesin  1 tablet Oral BID  . enoxaparin (LOVENOX) injection  60 mg Subcutaneous Q24H  . folic acid  1 mg Oral Daily  . insulin aspart  0-15 Units Subcutaneous TID WC  . insulin aspart  0-5 Units Subcutaneous QHS  . Melatonin  5 mg Oral QHS  . multivitamin with minerals  1 tablet Oral Daily  . potassium chloride  10 mEq Oral Daily  . vitamin B-12  1,000 mcg Oral Daily   Continuous Infusions: . 0.9 % NaCl with KCl 20 mEq / L 125 mL/hr at 07/08/19 0708     LOS: 1 day    Time spent: 35 minutes    Barb Merino, MD [facility] Pager [phone]

## 2019-07-08 NOTE — Progress Notes (Signed)
Pt COVID++, RT will hold BIPAP QHS per protocol.  RT to monitor and assess as needed.

## 2019-07-08 NOTE — Evaluation (Signed)
Occupational Therapy Evaluation Patient Details Name: [name] [name] MRN: [id number] DOB: 1945-12-02 Today's Date: 07/08/2019    History of Present Illness 73 year old female was admitted for extreme weakness, + COVID.  PMH:  morbid obesity, HTN, DM, peripheral neuropathy, L foot wound--wearing boot   Clinical Impression   Pt was admitted for the above.  At baseline, she is mod I.  Pt ambulated with min guard assist with rest break.  She fatiques easily, so needs more assistance with adls vs baseline. Will follow in acute setting with supervision level goals     Follow Up Recommendations  Supervision/Assistance - 24 hour    Equipment Recommendations  None recommended by OT    Recommendations for Other Services       Precautions / Restrictions Precautions Precautions: Fall(boot for LLE) Restrictions Weight Bearing Restrictions: No      Mobility Bed Mobility Overal bed mobility: Needs Assistance Bed Mobility: Supine to Sit;Sit to Supine     Supine to sit: Min guard Sit to supine: Min guard   General bed mobility comments: extra effort:  used momentum to roll up; boot was on for back to bed; removed once she was lying down  Transfers Overall transfer level: Needs assistance Equipment used: Rolling walker (2 wheeled) Transfers: Sit to/from Stand Sit to Stand: Min guard         General transfer comment: cues for UE placement; pt tends to pull up on RW    Balance                                           ADL either performed or assessed with clinical judgement   ADL Overall ADL's : Needs assistance/impaired Eating/Feeding: Independent   Grooming: Set up   Upper Body Bathing: Set up   Lower Body Bathing: Minimal assistance   Upper Body Dressing : Minimal assistance(lines)   Lower Body Dressing: Moderate assistance Lower Body Dressing Details (indicate cue type and reason): pt usually uses a shoehorn for shoes. Assisted with shoes then  boot Toilet Transfer: Min guard   Toileting- Clothing Manipulation and Hygiene: Moderate assistance         General ADL Comments: ambulated in room, twice with rest break.  Assisted with shoes and socks:  she normally can do this herself with shoehorn     Vision         Perception     Praxis      Pertinent Vitals/Pain Pain Assessment: No/denies pain     Hand Dominance     Extremity/Trunk Assessment Upper Extremity Assessment Upper Extremity Assessment: Overall WFL for tasks assessed           Communication Communication Communication: No difficulties   Cognition Arousal/Alertness: Awake/alert Behavior During Therapy: WFL for tasks assessed/performed Overall Cognitive Status: Within Functional Limits for tasks assessed                                     General Comments       Exercises     Shoulder Instructions      Home Living Family/patient expects to be discharged to:: Private residence Living Arrangements: Spouse/significant other Available Help at Discharge: Family               Bathroom Shower/Tub: Walk-in shower  Bathroom Toilet: Handicapped height     Home Equipment: Shower seat;Bedside commode;Walker - 2 wheels   Additional Comments: states she has all the DME      Prior Functioning/Environment Level of Independence: Independent        Comments: of note, pt's husband just released from [place] today        OT Problem List: Decreased strength;Decreased activity tolerance;Impaired balance (sitting and/or standing)      OT Treatment/Interventions: Self-care/ADL training;DME and/or AE instruction;Patient/family education;Balance training;Splinting;Energy conservation    OT Goals(Current goals can be found in the care plan section) Acute Rehab OT Goals Patient Stated Goal: home asap OT Goal Formulation: With patient Time For Goal Achievement: 07/22/19 Potential to Achieve Goals: Good ADL Goals Pt Will Perform  Grooming: with supervision;standing Pt Will Transfer to Toilet: with supervision;ambulating;bedside commode Pt Will Perform Toileting - Clothing Manipulation and hygiene: with supervision;sit to/from stand Additional ADL Goal #1: pt will perform ADL with set up/supervision with AE as needed  OT Frequency: Min 2X/week   Barriers to D/C:            Co-evaluation PT/OT/SLP Co-Evaluation/Treatment: Yes Reason for Co-Treatment: For patient/therapist safety PT goals addressed during session: Mobility/safety with mobility OT goals addressed during session: ADL's and self-care      AM-PAC OT "6 Clicks" Daily Activity     Outcome Measure Help from another person eating meals?: None Help from another person taking care of personal grooming?: A Little Help from another person toileting, which includes using toliet, bedpan, or urinal?: A Lot Help from another person bathing (including washing, rinsing, drying)?: A Little Help from another person to put on and taking off regular upper body clothing?: A Little Help from another person to put on and taking off regular lower body clothing?: A Lot 6 Click Score: 17   End of Session    Activity Tolerance: Patient tolerated treatment well Patient left: in bed;with call bell/phone within reach;with bed alarm set  OT Visit Diagnosis: Muscle weakness (generalized) (M62.81)                Time: [id number] OT Time Calculation (min): 33 min Charges:  OT General Charges $OT Visit: 1 Visit OT Evaluation $OT Eval Low Complexity: [place], OTR/L Acute Rehabilitation Services [phone] [facility] pager [phone] office 07/08/2019  [place] 07/08/2019, 2:38 PM

## 2019-07-09 ENCOUNTER — Inpatient Hospital Stay (HOSPITAL_COMMUNITY): Payer: Medicare Other

## 2019-07-09 DIAGNOSIS — U071 COVID-19: Secondary | ICD-10-CM | POA: Diagnosis present

## 2019-07-09 DIAGNOSIS — J1282 Pneumonia due to coronavirus disease 2019: Secondary | ICD-10-CM | POA: Diagnosis present

## 2019-07-09 LAB — URINALYSIS, ROUTINE W REFLEX MICROSCOPIC
Bilirubin Urine: NEGATIVE
Glucose, UA: 50 mg/dL — AB
Hgb urine dipstick: NEGATIVE
Ketones, ur: NEGATIVE mg/dL
Leukocytes,Ua: NEGATIVE
Nitrite: POSITIVE — AB
Protein, ur: NEGATIVE mg/dL
Specific Gravity, Urine: 1.019 (ref 1.005–1.030)
pH: 6 (ref 5.0–8.0)

## 2019-07-09 LAB — BASIC METABOLIC PANEL
Anion gap: 9 (ref 5–15)
BUN: 11 mg/dL (ref 8–23)
CO2: 26 mmol/L (ref 22–32)
Calcium: 7.9 mg/dL — ABNORMAL LOW (ref 8.9–10.3)
Chloride: 95 mmol/L — ABNORMAL LOW (ref 98–111)
Creatinine, Ser: 0.74 mg/dL (ref 0.44–1.00)
GFR calc Af Amer: >60 mL/min (ref >60)
GFR calc non Af Amer: >60 mL/min (ref >60)
Glucose, Bld: 203 mg/dL — ABNORMAL HIGH (ref 70–99)
Potassium: 3.7 mmol/L (ref 3.5–5.1)
Sodium: 130 mmol/L — ABNORMAL LOW (ref 135–145)

## 2019-07-09 LAB — CBC WITH DIFFERENTIAL/PLATELET
Abs Immature Granulocytes: 0.05 10*3/uL (ref 0.00–0.07)
Basophils Absolute: 0 10*3/uL (ref 0.0–0.1)
Basophils Relative: 0 %
Eosinophils Absolute: 0 10*3/uL (ref 0.0–0.5)
Eosinophils Relative: 1 %
HCT: 37.6 % (ref 36.0–46.0)
Hemoglobin: 12 g/dL (ref 12.0–15.0)
Immature Granulocytes: 1 %
Lymphocytes Relative: 23 %
Lymphs Abs: 1.4 10*3/uL (ref 0.7–4.0)
MCH: 29 pg (ref 26.0–34.0)
MCHC: 31.9 g/dL (ref 30.0–36.0)
MCV: 90.8 fL (ref 80.0–100.0)
Monocytes Absolute: 0.4 10*3/uL (ref 0.1–1.0)
Monocytes Relative: 6 %
Neutro Abs: 4.2 10*3/uL (ref 1.7–7.7)
Neutrophils Relative %: 69 %
Platelets: 151 10*3/uL (ref 150–400)
RBC: 4.14 MIL/uL (ref 3.87–5.11)
RDW: 17.2 % — ABNORMAL HIGH (ref 11.5–15.5)
WBC: 6 10*3/uL (ref 4.0–10.5)
nRBC: 0 % (ref 0.0–0.2)

## 2019-07-09 LAB — MAGNESIUM: Magnesium: 1.7 mg/dL (ref 1.7–2.4)

## 2019-07-09 LAB — GLUCOSE, CAPILLARY
Glucose-Capillary: 146 mg/dL — ABNORMAL HIGH (ref 70–99)
Glucose-Capillary: 163 mg/dL — ABNORMAL HIGH (ref 70–99)
Glucose-Capillary: 398 mg/dL — ABNORMAL HIGH (ref 70–99)
Glucose-Capillary: 399 mg/dL — ABNORMAL HIGH (ref 70–99)
Glucose-Capillary: 340 mg/dL — ABNORMAL HIGH (ref 70–99)

## 2019-07-09 LAB — C-REACTIVE PROTEIN: CRP: 17.4 mg/dL — ABNORMAL HIGH (ref <1.0)

## 2019-07-09 LAB — FERRITIN: Ferritin: 75 ng/mL (ref 11–307)

## 2019-07-09 LAB — D-DIMER, QUANTITATIVE: D-Dimer, Quant: 1.18 ug/mL-FEU — ABNORMAL HIGH (ref 0.00–0.50)

## 2019-07-09 LAB — LACTIC ACID, PLASMA: Lactic Acid, Venous: 1.9 mmol/L (ref 0.5–1.9)

## 2019-07-09 LAB — PHOSPHORUS: Phosphorus: 1.9 mg/dL — ABNORMAL LOW (ref 2.5–4.6)

## 2019-07-09 MED ORDER — VANCOMYCIN HCL 10 G IV SOLR
2000.0000 mg | Freq: Once | INTRAVENOUS | Status: AC
  Administered 2019-07-09: 2000 mg via INTRAVENOUS
  Filled 2019-07-09: qty 2000

## 2019-07-09 MED ORDER — VANCOMYCIN HCL IN DEXTROSE 750-5 MG/150ML-% IV SOLN
750.0000 mg | Freq: Two times a day (BID) | INTRAVENOUS | Status: DC
  Administered 2019-07-10 – 2019-07-11 (×3): 750 mg via INTRAVENOUS
  Filled 2019-07-09 (×4): qty 150

## 2019-07-09 MED ORDER — INSULIN DETEMIR 100 UNIT/ML ~~LOC~~ SOLN
18.0000 [IU] | Freq: Two times a day (BID) | SUBCUTANEOUS | Status: DC
  Administered 2019-07-09: 18 [IU] via SUBCUTANEOUS
  Filled 2019-07-09 (×3): qty 0.18

## 2019-07-09 MED ORDER — CYCLOBENZAPRINE HCL 10 MG PO TABS
10.0000 mg | ORAL_TABLET | Freq: Every day | ORAL | Status: DC
  Administered 2019-07-10 – 2019-07-12 (×3): 10 mg via ORAL
  Filled 2019-07-09 (×3): qty 1

## 2019-07-09 MED ORDER — VANCOMYCIN HCL IN DEXTROSE 1-5 GM/200ML-% IV SOLN: 1000.0000 mg | Freq: Two times a day (BID) | INTRAVENOUS | Status: DC

## 2019-07-09 MED ORDER — SODIUM CHLORIDE 0.9 % IV SOLN
100.0000 mg | INTRAVENOUS | Status: AC
  Administered 2019-07-10 – 2019-07-13 (×4): 100 mg via INTRAVENOUS
  Filled 2019-07-09 (×4): qty 100

## 2019-07-09 MED ORDER — POTASSIUM & SODIUM PHOSPHATES 280-160-250 MG PO PACK
1.0000 | PACK | Freq: Three times a day (TID) | ORAL | Status: DC
  Administered 2019-07-09 – 2019-07-13 (×17): 1 via ORAL
  Filled 2019-07-09 (×22): qty 1

## 2019-07-09 MED ORDER — DEXAMETHASONE SODIUM PHOSPHATE 10 MG/ML IJ SOLN
10.0000 mg | Freq: Once | INTRAMUSCULAR | Status: AC
  Administered 2019-07-09: 10 mg via INTRAVENOUS
  Filled 2019-07-09: qty 1

## 2019-07-09 MED ORDER — DEXAMETHASONE SODIUM PHOSPHATE 10 MG/ML IJ SOLN
6.0000 mg | INTRAMUSCULAR | Status: DC
  Administered 2019-07-10: 09:00:00 6 mg via INTRAVENOUS
  Filled 2019-07-09: qty 1

## 2019-07-09 MED ORDER — SODIUM CHLORIDE 0.9 % IV SOLN
200.0000 mg | Freq: Once | INTRAVENOUS | Status: AC
  Administered 2019-07-09: 200 mg via INTRAVENOUS
  Filled 2019-07-09: qty 40

## 2019-07-09 NOTE — Plan of Care (Signed)

## 2019-07-09 NOTE — Progress Notes (Signed)
PROGRESS NOTE    [name] [name]  [id number] DOB: [birth year] DOA: 07/07/2019 PCP: Leeroy Cha, MD    Brief Narrative:   Patient is 73 year old female with extensive medical problems including sleep apnea, obesity hypoventilation on BiPAP at home, type 2 diabetes on insulin with peripheral neuropathy and diabetic foot ulcer, hypertension, hyperlipidemia, lymphocytic colitis on methotrexate and steroids, arthritis who is presenting to the emergency room with recent diagnosis of COVID-19 infection and extreme fatigue and weakness.  In the emergency room she was found with low blood pressure, mildly elevated lactic.  Chest x-ray was clear.  She was on room air.  With clinical dehydration and debility and admitted to the hospital. 07/09/2019: Has more cough with the sputum production, oxygen saturations 92% on room air, exertional shortness of breath, elevated CRP.  Assessment & Plan:   Principal Problem:   Physical deconditioning Active Problems:   OSA (obstructive sleep apnea)   Essential hypertension   DM2 (diabetes mellitus, type 2) (HCC)   Diabetic foot ulcer (HCC)   Ischemic colitis ([place])   COVID-19  Physical debility: Due to extensive medical issues and ongoing viral infection.  Patient is to continue to work with PT OT.  Anticipate to work with PT OT at home when medically stable.  Pneumonia due to COVID-19 virus:  Continue to monitor due to significant symptoms. chest physiotherapy, incentive spirometry, deep breathing exercises, sputum induction, mucolytic's and bronchodilators. Supplemental oxygen to keep saturations more than 90%. Covid directed therapy with , steroids dexamethasone 10 mg once given, will continue dexamethasone 6 mg daily.  Patient is on budesonide 9 mg for colitis, probably will benefit with additional steroids with active viral infection. remdesivir 200 mg loading dose, 100 mg daily.  LFTs are acceptable. antibiotics given vancomycin with  positive blood cultures, also suspect contaminant. Due to severity of symptoms, patient will need daily inflammatory markers, chest x-rays, liver function test to monitor and direct COVID-19 therapies.    Obstructive sleep apnea on BiPAP at home: Patient will not use BiPAP in the hospital to reduce aerosolization.  She can use oxygen or high flow oxygen at night for hypoxia.  Type 2 diabetes with peripheral neuropathy: On insulin at home.  Resume insulin.  Keep on sliding scale.  Holding Metformin in the hospital. Anticipate worsening blood sugars with addition of steroids, will increase long-acting insulin today.  Keep on sliding scale insulin.  Diabetic foot ulcer: Patient does have chronic diabetic foot ulcer left foot with fracture of the metatarsal head.  Followed by surgery outpatient.  Currently on offloading boot and local dressing.  Clinically improving as per patient and family.  Currently without evidence of obvious fluctuation/underlying collection.  Continue local wound care and offloading. Repeat x-ray today shows metatarsal head fracture, probably chronic.  Discussed with patient and family, will stay offloading and follow-up with orthopedics.  Hypertension: Blood pressure stable.  Resume medications.  Lymphocytic colitis: Patient has severe disease.  She is on budesonide that we will resume.  Patient is on methotrexate that she will take at home after clinical improvement.  Uses loperamide as needed.  Clinical dehydration: Adequately hydrated.  Discontinue IV fluids to avoid fluid overload.  Potassium replaced.  Gram-positive bacteremia: 1 out of 4 blood cultures positive for gram-positive cocci.  Patient has COVID-19 pneumonia as explanation of her acute illness.  Will give loading dose of vancomycin until final cultures are reported.  Case discussed with [name] MD gastroenterologist, patient's daughter with patient's request.  DVT prophylaxis:  Lovenox subcu Code  Status: Full code Family Communication: Dr Tarri Glenn with patient's request. Disposition Plan: She has new infiltrates and hypoxia.  Will need inpatient treatment.  Transfer to [facility].   Consultants:   None  Procedures:   None  Antimicrobials:   Vancomycin, 07/09/2019>>>  Remdesivir, 07/09/2019>>>   Subjective: Seen and examined.  Cough with some mucoid sputum production.  Remains persistently febrile with temperature 101.2 at the time of examination. Denies any nausea vomiting.  Denies any wheezing or chest pain.  Denies any pain on her foot.  Appetite is poor. Feels somewhat short of breath with ongoing cough.   Objective: Vitals:   07/09/19 0047 07/09/19 0522 07/09/19 0524 07/09/19 0846  BP: 112/60 (!) 157/86 (!) 141/73 134/61  Pulse: 85 87 87 92  Resp: (!) 22 19  (!) 22  Temp: 98 F (36.7 C) 99.5 F (37.5 C)  (!) 101.2 F (38.4 C)  TempSrc: Oral Oral  Oral  SpO2: 96% 92%  92%  Weight:      Height:        Intake/Output Summary (Last 24 hours) at 07/09/2019 1128 Last data filed at 07/09/2019 0600 Gross per 24 hour  Intake 1638.84 ml  Output 100 ml  Net 1538.84 ml   Filed Weights   07/07/19 1232 07/08/19 0602  Weight: 117.9 kg 117.2 kg    Examination:  General exam: Appears comfortable but anxious.   Respiratory system: Bilateral conducted airway sounds.  Coarse crackles and upper airway sounds on the posterior lung fields.   Cardiovascular system: S1 & S2 heard, RRR. No JVD, murmurs, rubs, gallops or clicks. No pedal edema. Gastrointestinal system: Abdomen is nondistended, soft and nontender. No organomegaly or masses felt. Normal bowel sounds heard. Central nervous system: Alert and oriented. No focal neurological deficits. Extremities: Symmetric 5 x 5 power. Skin: No rashes, lesions , ecchymosis She does have a quarter sized open nonhealing wound on the left plantar aspect, no drainage, no fluctuation. Psychiatry: Judgement and insight  appear normal. Mood & affect anxious    Data Reviewed: I have personally reviewed following labs and imaging studies  CBC: Recent Labs  Lab 07/07/19 1410 07/08/19 0414 07/09/19 0933  WBC 6.9 7.0 6.0  NEUTROABS 5.2  --  4.2  HGB 12.3 12.0 12.0  HCT 39.1 38.7 37.6  MCV 92.0 93.0 90.8  PLT 185 152 [id number]   Basic Metabolic Panel: Recent Labs  Lab 07/07/19 1410 07/08/19 0414 07/09/19 0419  NA 130* 130* 130*  K 3.0* 3.4* 3.7  CL 90* 94* 95*  CO2 27 25 26   GLUCOSE 161* 210* 203*  BUN 13 10 11   CREATININE 0.79 0.64 0.74  CALCIUM 8.8* 8.1* 7.9*  MG  --   --  1.7  PHOS  --   --  1.9*   GFR: Estimated Creatinine Clearance: 81.6 mL/min (by C-G formula based on SCr of 0.74 mg/dL). Liver Function Tests: Recent Labs  Lab 07/07/19 1410 07/08/19 0414  AST 36 28  ALT 41 32  ALKPHOS 62 52  BILITOT 0.6 0.6  PROT 6.4* 5.9*  ALBUMIN 3.1* 2.9*   No results for input(s): LIPASE, AMYLASE in the last 168 hours. No results for input(s): AMMONIA in the last 168 hours. Coagulation Profile: No results for input(s): INR, PROTIME in the last 168 hours. Cardiac Enzymes: No results for input(s): CKTOTAL, CKMB, CKMBINDEX, TROPONINI in the last 168 hours. BNP (last 3 results) No results for input(s): PROBNP in the last 8760 hours. HbA1C:  Recent Labs    07/07/19 1956  HGBA1C 9.3*   CBG: Recent Labs  Lab 07/08/19 0916 07/08/19 1128 07/08/19 1644 07/08/19 2015 07/09/19 0754  GLUCAP 156* 172* 178* 259* 146*   Lipid Profile: Recent Labs    07/07/19 1411  TRIG 93   Thyroid Function Tests: Recent Labs    07/07/19 1956  TSH 1.124   Anemia Panel: Recent Labs    07/08/19 0414 07/09/19 0419  FERRITIN 64 75   Sepsis Labs: Recent Labs  Lab 07/07/19 1410 07/07/19 1956 07/08/19 0922 07/08/19 1154 07/09/19 0933  PROCALCITON <0.10  --   --   --   --   LATICACIDVEN 1.9 2.7* 1.7 2.9* 1.9    Recent Results (from the past 240 hour(s))  Blood Culture (routine x 2)      Status: None (Preliminary result)   Collection Time: 07/07/19  2:10 PM   Specimen: BLOOD  Result Value Ref [place] Status   Specimen Description   Final    BLOOD LEFT ANTECUBITAL Performed at [facility], [facility] [employer] [facility] [address], [place], [place] [address]    Special Requests   Final    BOTTLES DRAWN AEROBIC AND ANAEROBIC Blood Culture adequate volume Performed at [facility] [address] [place], [place] [address]    Culture   Final    NO GROWTH 2 DAYS Performed at [facility] [address] [place], [place] [address]    Report Status PENDING  Incomplete  Blood Culture (routine x 2)     Status: None (Preliminary result)   Collection Time: 07/07/19  2:11 PM   Specimen: BLOOD  Result Value Ref [place] Status   Specimen Description   Final    BLOOD LEFT ANTECUBITAL Performed at [facility] [address] [place], [place] [address]    Special Requests   Final    BOTTLES DRAWN AEROBIC AND ANAEROBIC Blood Culture adequate volume Performed at [facility] [address] [place], [place] [address]    Culture  Setup Time   Final    GRAM POSITIVE COCCI CRITICAL RESULT CALLED TO, READ BACK BY AND VERIFIED WITH: PHARM D BETH GREEN AT 0305 BY [place] ON 07/09/2019    Culture   Final    NO GROWTH 2 DAYS Performed at [facility], [place] [address] [place], [place] [address]    Report Status PENDING  Incomplete         Radiology Studies: Dg Chest Port 1 View  Result Date: 07/09/2019 CLINICAL DATA:  COVID-19 positive patient. EXAM: PORTABLE CHEST 1 VIEW COMPARISON:  July 07, 2019 FINDINGS: Bibasilar pulmonary infiltrates are worsened in the interval. The heart, hila, mediastinum, lungs, and pleura are otherwise unremarkable. IMPRESSION: There are bilateral pulmonary infiltrates which have worsened in the interval. Recommend attention on follow-up. Electronically Signed   By: Dorise Bullion III M.D   On: 07/09/2019 11:04   Dg Chest Port 1 View  Result Date: 07/07/2019 CLINICAL DATA:  Shortness of breath. COVID-19. Weakness and fatigue and chest congestion. EXAM: PORTABLE CHEST 1 VIEW COMPARISON:  08/22/2015 FINDINGS: The heart size and mediastinal contours are within normal limits. Both lungs are clear. Severe chronic arthritic changes of both glenohumeral joints. No acute bone abnormality. IMPRESSION: No active disease in the chest. Electronically Signed   By: Lorriane Shire M.D.   On: 07/07/2019 14:39   Dg Foot Complete Left  Result Date: 07/09/2019 CLINICAL DATA:  Left  foot pain.  No known injury. EXAM: LEFT FOOT - COMPLETE 3+ VIEW COMPARISON:  None. FINDINGS: A hallux valgus deformity is identified. Soft tissue swelling is seen over the dorsum of the foot. There is a fracture through the distal third metatarsal. Scattered degenerative changes. No other fractures. No other acute abnormalities. IMPRESSION: There is a fracture through the distal third metatarsal with overlying soft tissue swelling. Electronically Signed   By: Dorise Bullion III M.D   On: 07/09/2019 11:10        Scheduled Meds: . acidophilus  1 capsule Oral Daily  . allopurinol  100 mg Oral BID  . aspirin EC  81 mg Oral QODAY  . budesonide  9 mg Oral Daily  . cholecalciferol  1,000 Units Oral BID  . citalopram  40 mg Oral Daily  . [START ON 07/10/2019] cyclobenzaprine  10 mg Oral QHS  . [START ON 07/10/2019] dexamethasone (DECADRON) injection  6 mg Intravenous Q24H  . dextromethorphan-guaiFENesin  1 tablet Oral BID  . enoxaparin (LOVENOX) injection  60 mg Subcutaneous Q24H  . folic acid  1 mg Oral Daily  . lisinopril  20 mg Oral Daily   And  . hydrochlorothiazide  25 mg Oral Daily  . insulin aspart  0-15 Units Subcutaneous TID WC  . insulin aspart  0-5 Units Subcutaneous QHS  . insulin detemir  14 Units Subcutaneous BID  . loperamide  6 mg Oral BID  . Melatonin  5 mg Oral QHS  .  multivitamin with minerals  1 tablet Oral Daily  . pantoprazole  40 mg Oral Daily  . potassium chloride  10 mEq Oral Daily  . vitamin B-12  1,000 mcg Oral Daily   Continuous Infusions:    LOS: 2 days    Time spent: 35 minutes    Barb Merino, MD [facility] Pager [phone]

## 2019-07-09 NOTE — Progress Notes (Signed)
Report given to Denton Ar, RN at [facility].  Attempted to call daughter to let her know that her mother had been transferred but unable to reach her.

## 2019-07-09 NOTE — Progress Notes (Signed)
CMT reported pt had 15 beat run of V-tach at 0147. Pt was asleep at this time. VS remained stable. Will continue to monitor.

## 2019-07-09 NOTE — Progress Notes (Signed)
PHARMACY - PHYSICIAN COMMUNICATION CRITICAL VALUE ALERT - BLOOD CULTURE IDENTIFICATION (BCID)  ONALEE STEINMEYER is an 73 y.o. female who presented to [facility] on 07/07/2019 with a chief complaint of Covid-19, weakness, fatgue.   Assessment:   (include suspected source if known) 1 bottle GPC - likely contaminant  Name of physician (or Provider) Contacted: n/a  Current antibiotics: none  Changes to prescribed antibiotics recommended:  No change  No results found for this or any previous visit.  Dorrene German 07/09/2019  3:07 AM

## 2019-07-09 NOTE — Progress Notes (Addendum)
Pharmacy Antibiotic Note  [name] [name] is a 73 y.o. female admitted on 07/07/2019 with COVID and fatigue/weakness. Patient continues to have fevers, and 11/27 BCx now growing 1/2 GPC. Pharmacy has been consulted for vancomycin and remdesivir dosing. Normal PCT and WBC suggest viral etiology, but MD would like to cover for GPC until speciated  Today, 07/09/2019:  Temp 101.2 this AM  WBC WNL  SCr stable WNL  SpO2 remains stable on RA  ALT WNL  Plan:  Vancomycin 2000 mg IV now, then 750 mg IV q12 hr (est AUC 480 based on SCr 0.8, Vd 0.5)  Measure vancomycin AUC at steady state as indicated  SCr q48 hr while on vancomycin  Low threshold to d/c vancomycin if c/w contamination picture . Remdesivir 200 mg IV once followed by 100 mg IV daily x 4 days . Daily CMET while on remdesivir . Follow ALT and clinical condition  Height: 5\' 6"  (167.6 cm) Weight: 258 lb 6.1 oz (117.2 kg) IBW/kg (Calculated) : 59.3  Temp (24hrs), Avg:99.8 F (37.7 C), Min:98 F (36.7 C), Max:101.2 F (38.4 C)  Recent Labs  Lab 07/07/19 1410 07/07/19 1956 07/08/19 0414 07/08/19 0922 07/08/19 1154 07/09/19 0419 07/09/19 0933  WBC 6.9  --  7.0  --   --   --  6.0  CREATININE 0.79  --  0.64  --   --  0.74  --   LATICACIDVEN 1.9 2.7*  --  1.7 2.9*  --  1.9   Recent Labs    07/07/19 1410 07/07/19 1411 07/08/19 0414 07/09/19 0419  DDIMER 1.58*  --  1.19* 1.18*  ALT 41  --  32  --   CRP  --  8.5* 14.8* 17.4*    Estimated Creatinine Clearance: 81.6 mL/min (by C-G formula based on SCr of 0.74 mg/dL).    Allergies  Allergen Reactions  . Other Shortness Of Breath    Blue fish: palms and feet turn red  . Bee Venom Swelling  . Sulfa Antibiotics Hives    Antimicrobials this admission: 11/29 vanc >>  11/29 remdesivir >>   Dose adjustments this admission: n/a  Microbiology results: 11/27 BCx: 1/2 (?/4 bottles) GPC   Thank you for allowing pharmacy to be a part of this patient's  care.  Nickey Canedo A 07/09/2019 11:41 AM

## 2019-07-09 NOTE — Progress Notes (Signed)
Occupational Therapy Treatment Patient Details Name: [name] [name] MRN: [id number] DOB: 1946/04/27 Today's Date: 07/09/2019    History of present illness 73 year old female was admitted for extreme weakness, + COVID.  PMH:  morbid obesity, HTN, DM, peripheral neuropathy, L foot wound--wearing boot   OT comments  Pt had a fever this am per nursing. She was agreeable to sitting eob for adl.  Did not want to stand, transfer or ambulate in room today.  Follow Up Recommendations  Supervision/Assistance - 24 hour    Equipment Recommendations  None recommended by OT    Recommendations for Other Services      Precautions / Restrictions Precautions Precautions: Fall Required Braces or Orthoses: Other Brace Other Brace: cam boot Restrictions Weight Bearing Restrictions: No       Mobility Bed Mobility         Supine to sit: Supervision;HOB elevated Sit to supine: Min guard;HOB elevated   General bed mobility comments: extra effort for back to bed; scooted up [place] with min guard assist  Transfers                 General transfer comment: did not want to stand, transfer nor walk today    Balance                                           ADL either performed or assessed with clinical judgement   ADL       Grooming: Set up   Upper Body Bathing: Set up       Upper Body Dressing : Minimal assistance                     General ADL Comments: pt agreeable to sitting eob for adl and grooming.  Feeling better than this am (had a fever) but still didn't feel up to standing for peri care.  Has Cam Boot, but she doesn't have her preferred sock to use with this     Vision       Perception     Praxis      Cognition Arousal/Alertness: Awake/alert Behavior During Therapy: WFL for tasks assessed/performed Overall Cognitive Status: Within Functional Limits for tasks assessed                                           Exercises     Shoulder Instructions       General Comments      Pertinent Vitals/ Pain       Pain Assessment: No/denies pain  Home Living                                          Prior Functioning/Environment              Frequency  Min 2X/week        Progress Toward Goals  OT Goals(current goals can now be found in the care plan section)  Progress towards OT goals: Not progressing toward goals - comment(didnt feel as well today)     Plan      Co-evaluation  AM-PAC OT "6 Clicks" Daily Activity     Outcome Measure   Help from another person eating meals?: None Help from another person taking care of personal grooming?: A Little Help from another person toileting, which includes using toliet, bedpan, or urinal?: A Lot Help from another person bathing (including washing, rinsing, drying)?: A Little Help from another person to put on and taking off regular upper body clothing?: A Little Help from another person to put on and taking off regular lower body clothing?: A Lot 6 Click Score: 17    End of Session    OT Visit Diagnosis: Muscle weakness (generalized) (M62.81)   Activity Tolerance Patient tolerated treatment well   Patient Left in bed;with call bell/phone within reach;with bed alarm set   Nurse Communication          Time: [id number] OT Time Calculation (min): 30 min  Charges: OT General Charges $OT Visit: 1 Visit OT Treatments $Self Care/Home Management : 8-22 mins $Therapeutic Activity: 8-22 mins  Lesle Chris, OTR/L Acute Rehabilitation Services [phone] [facility] pager [phone] office 07/09/2019   [place] 07/09/2019, 2:34 PM

## 2019-07-10 DIAGNOSIS — G4733 Obstructive sleep apnea (adult) (pediatric): Secondary | ICD-10-CM

## 2019-07-10 DIAGNOSIS — K559 Vascular disorder of intestine, unspecified: Secondary | ICD-10-CM

## 2019-07-10 DIAGNOSIS — I10 Essential (primary) hypertension: Secondary | ICD-10-CM

## 2019-07-10 DIAGNOSIS — U071 COVID-19: Principal | ICD-10-CM

## 2019-07-10 DIAGNOSIS — J1289 Other viral pneumonia: Secondary | ICD-10-CM

## 2019-07-10 LAB — CBC WITH DIFFERENTIAL/PLATELET
Abs Immature Granulocytes: 0.05 10*3/uL (ref 0.00–0.07)
Basophils Absolute: 0 10*3/uL (ref 0.0–0.1)
Basophils Relative: 0 %
Eosinophils Absolute: 0 10*3/uL (ref 0.0–0.5)
Eosinophils Relative: 0 %
HCT: 33 % — ABNORMAL LOW (ref 36.0–46.0)
Hemoglobin: 10.9 g/dL — ABNORMAL LOW (ref 12.0–15.0)
Immature Granulocytes: 1 %
Lymphocytes Relative: 18 %
Lymphs Abs: 0.8 10*3/uL (ref 0.7–4.0)
MCH: 29.4 pg (ref 26.0–34.0)
MCHC: 33 g/dL (ref 30.0–36.0)
MCV: 88.9 fL (ref 80.0–100.0)
Monocytes Absolute: 0.3 10*3/uL (ref 0.1–1.0)
Monocytes Relative: 6 %
Neutro Abs: 3.4 10*3/uL (ref 1.7–7.7)
Neutrophils Relative %: 75 %
Platelets: 169 10*3/uL (ref 150–400)
RBC: 3.71 MIL/uL — ABNORMAL LOW (ref 3.87–5.11)
RDW: 16.9 % — ABNORMAL HIGH (ref 11.5–15.5)
WBC: 4.6 10*3/uL (ref 4.0–10.5)
nRBC: 0 % (ref 0.0–0.2)

## 2019-07-10 LAB — COMPREHENSIVE METABOLIC PANEL
ALT: 29 U/L (ref 0–44)
AST: 29 U/L (ref 15–41)
Albumin: 2.4 g/dL — ABNORMAL LOW (ref 3.5–5.0)
Alkaline Phosphatase: 43 U/L (ref 38–126)
Anion gap: 10 (ref 5–15)
BUN: 15 mg/dL (ref 8–23)
CO2: 25 mmol/L (ref 22–32)
Calcium: 8.1 mg/dL — ABNORMAL LOW (ref 8.9–10.3)
Chloride: 97 mmol/L — ABNORMAL LOW (ref 98–111)
Creatinine, Ser: 0.82 mg/dL (ref 0.44–1.00)
GFR calc Af Amer: >60 mL/min (ref >60)
GFR calc non Af Amer: >60 mL/min (ref >60)
Glucose, Bld: 302 mg/dL — ABNORMAL HIGH (ref 70–99)
Potassium: 3.5 mmol/L (ref 3.5–5.1)
Sodium: 132 mmol/L — ABNORMAL LOW (ref 135–145)
Total Bilirubin: 0.7 mg/dL (ref 0.3–1.2)
Total Protein: 5.5 g/dL — ABNORMAL LOW (ref 6.5–8.1)

## 2019-07-10 LAB — GLUCOSE, CAPILLARY
Glucose-Capillary: 252 mg/dL — ABNORMAL HIGH (ref 70–99)
Glucose-Capillary: 248 mg/dL — ABNORMAL HIGH (ref 70–99)
Glucose-Capillary: 339 mg/dL — ABNORMAL HIGH (ref 70–99)
Glucose-Capillary: 382 mg/dL — ABNORMAL HIGH (ref 70–99)

## 2019-07-10 LAB — C-REACTIVE PROTEIN: CRP: 16.4 mg/dL — ABNORMAL HIGH (ref <1.0)

## 2019-07-10 LAB — FERRITIN: Ferritin: 89 ng/mL (ref 11–307)

## 2019-07-10 LAB — ABO/RH: ABO/RH(D): O POS

## 2019-07-10 LAB — MAGNESIUM: Magnesium: 1.6 mg/dL — ABNORMAL LOW (ref 1.7–2.4)

## 2019-07-10 LAB — D-DIMER, QUANTITATIVE: D-Dimer, Quant: 1.38 ug/mL-FEU — ABNORMAL HIGH (ref 0.00–0.50)

## 2019-07-10 MED ORDER — INSULIN DETEMIR 100 UNIT/ML ~~LOC~~ SOLN
22.0000 [IU] | Freq: Two times a day (BID) | SUBCUTANEOUS | Status: DC
  Administered 2019-07-10 – 2019-07-11 (×3): 22 [IU] via SUBCUTANEOUS
  Filled 2019-07-10 (×4): qty 0.22

## 2019-07-10 MED ORDER — MAGNESIUM SULFATE 2 GM/50ML IV SOLN
2.0000 g | Freq: Once | INTRAVENOUS | Status: AC
  Administered 2019-07-10: 2 g via INTRAVENOUS
  Filled 2019-07-10: qty 50

## 2019-07-10 MED ORDER — INSULIN ASPART 100 UNIT/ML ~~LOC~~ SOLN
4.0000 [IU] | Freq: Three times a day (TID) | SUBCUTANEOUS | Status: DC
  Administered 2019-07-10 – 2019-07-11 (×5): 4 [IU] via SUBCUTANEOUS

## 2019-07-10 MED ORDER — METHYLPREDNISOLONE SODIUM SUCC 125 MG IJ SOLR
60.0000 mg | Freq: Two times a day (BID) | INTRAMUSCULAR | Status: DC
  Administered 2019-07-10 – 2019-07-12 (×5): 60 mg via INTRAVENOUS
  Filled 2019-07-10 (×6): qty 2

## 2019-07-10 MED ORDER — MUPIROCIN CALCIUM 2 % EX CREA
TOPICAL_CREAM | Freq: Every day | CUTANEOUS | Status: DC
  Filled 2019-07-10: qty 15

## 2019-07-10 MED ORDER — SODIUM CHLORIDE 0.9% IV SOLUTION
Freq: Once | INTRAVENOUS | Status: AC
  Administered 2019-07-10: via INTRAVENOUS

## 2019-07-10 NOTE — Progress Notes (Addendum)
Pt daughter was updated today & all questions & concerns were addressed  Pt refused foot assessment this am w/ MD & myself in room. [place] nurse did come by to see pt today & assess chronic foot wounds.  Purewick remains in place  IV steroids cont Iv vanc IV Remdesivir day 1 11/30 Pt is on RA, afebrile Eating well.  Pt worked w/ PT today.

## 2019-07-10 NOTE — Progress Notes (Signed)
Inpatient Diabetes Program Recommendations  AACE/ADA: New Consensus Statement on Inpatient Glycemic Control (2015)  Target Ranges:  Prepandial:   less than 140 mg/dL      Peak postprandial:   less than 180 mg/dL (1-2 hours)      Critically ill patients:  140 - 180 mg/dL   Lab Results  Component Value Date   GLUCAP 248 (H) 07/10/2019   HGBA1C 9.3 (H) 07/07/2019    Review of Glycemic Control Results for YUKO, FABELA (MRN [id number]) as of 07/10/2019 12:53  Ref. Range 07/09/2019 20:06 07/09/2019 21:40 07/10/2019 07:18 07/10/2019 12:01  Glucose-Capillary Latest Ref Range: 70 - 99 mg/dL 399 (H) 340 (H) 252 (H) 248 (H)   Diabetes history: DM 2 Outpatient Diabetes medications:  Levemir 14 units bid, Metformin XR 500 mg bid Current orders for Inpatient glycemic control:  Novolog moderate tid with meals and HS Novolog 4 units tid with meals Levemir 22 units bid Solumedrol 60 mg q 12 hours Inpatient Diabetes Program Recommendations:   Consider adding Tradjenta 5 mg daily.   Thanks, Adah Perl, RN, BC-ADM Inpatient Diabetes Coordinator Pager [phone] (8a-5p)

## 2019-07-10 NOTE — Progress Notes (Signed)
Physical Therapy Treatment Patient Details Name: [name] [name] MRN: [id number] DOB: 05-Nov-1945 Today's Date: 07/10/2019    History of Present Illness 73 year old female was admitted for extreme weakness, + COVID.  PMH:  morbid obesity, HTN, DM, peripheral neuropathy, L foot wound--wearing boot    PT Comments    The patient ambulated in room  X 25' Patient declined to place boot, states that she uses sneakers if not going far. Reiterated precaution to use boot, patient delined. Patient is improving with mobility. Patient on Ra today encouraged IS and flutter valve use.  Follow Up Recommendations  No PT follow up     Equipment Recommendations  None recommended by PT    Recommendations for Other Services       Precautions / Restrictions Precautions Precautions: Fall Required Braces or Orthoses: Other Brace Other Brace: cam boot, patient states that she wears sneakers not CAM , if not walking too far. patient requested snwakers this visit.    Mobility  Bed Mobility   Bed Mobility: Supine to Sit     Supine to sit: Supervision;HOB elevated     General bed mobility comments: extra time and effort ,no assistance  Transfers   Equipment used: Rolling walker (2 wheeled) Transfers: Sit to/from Stand Sit to Stand: Supervision            Ambulation/Gait Ambulation/Gait assistance: Min guard Gait Distance (Feet): 25 Feet Assistive device: Rolling walker (2 wheeled) Gait Pattern/deviations: Step-to pattern;Step-through pattern;Decreased step length - right;Decreased step length - left;Shuffle;Trunk flexed     General Gait Details: patient used sneakers, managed RW with turns   Marine scientist [place] (Stroke Patients Only)       Balance   Sitting-balance support: No upper extremity supported;Feet supported Sitting balance-Leahy Scale: Good     Standing balance support: Bilateral upper extremity supported Standing  balance-Leahy Scale: Fair                              Cognition Arousal/Alertness: Awake/alert Behavior During Therapy: WFL for tasks assessed/performed                                          Exercises      General Comments        Pertinent Vitals/Pain Pain Assessment: No/denies pain    Home Living                      Prior Function            PT Goals (current goals can now be found in the care plan section) Progress towards PT goals: Progressing toward goals    Frequency    Min 3X/week      PT Plan Current plan remains appropriate    Co-evaluation              AM-PAC PT "6 Clicks" Mobility   Outcome Measure  Help needed turning from your back to your side while in a flat bed without using bedrails?: None Help needed moving from lying on your back to sitting on the side of a flat bed without using bedrails?: None Help needed moving to and from a bed to a chair (including a wheelchair)?: A Little  Help needed standing up from a chair using your arms (e.g., wheelchair or bedside chair)?: A Little Help needed to walk in hospital room?: A Little Help needed climbing 3-5 steps with a railing? : A Little 6 Click Score: 20    End of Session   Activity Tolerance: Patient tolerated treatment well;Patient limited by fatigue Patient left: in chair;with call bell/phone within reach Nurse Communication: Mobility status PT Visit Diagnosis: Difficulty in walking, not elsewhere classified (R26.2);Muscle weakness (generalized) (M62.81)     Time: [id number] PT Time Calculation (min) (ACUTE ONLY): 36 min  Charges:  $Gait Training: 23-37 mins                     [place]  Office [phone]    Claretha Cooper 07/10/2019, 3:03 PM

## 2019-07-10 NOTE — Progress Notes (Addendum)
PROGRESS NOTE  [name] [name] [id number] DOB: [birth year] DOA: 07/07/2019 PCP: Leeroy Cha, MD   LOS: 3 days   Brief Narrative / Interim history: 73 year old female with history of obesity, OSA, DM, HTN, HLD, lymphocytic colitis, DVT in 2013, presented to the hospital with significant weakness, generalized body aches as well as a low-grade fever.  She was also complaining of a dry cough and significant rhinorrhea.  She was found to be Covid positive and a chest x-ray showed bilateral pulmonary infiltrates.  She was started on steroids as well as remdesivir and admitted to the hospital.  Subjective / 24h Interval events: She is eating breakfast this morning, doing well.  She denies any shortness of breath, no cough or chest congestion.  Assessment & Plan: Principal Problem:   Pneumonia due to COVID-19 virus Active Problems:   OSA (obstructive sleep apnea)   Essential hypertension   DM2 (diabetes mellitus, type 2) (HCC)   Diabetic foot ulcer (HCC)   Ischemic colitis ([place])   COVID-19   Physical deconditioning   Principal Problem Covid-19 Viral multifocal pneumonia -Patient started on Decadron, continue for a total of 10 days -Started on remdesivir for 5 days with end date on 07/13/2019 -She is afebrile and satting in the low 90s on room air -Incentive spirometry, flutter valve -Continue to monitor inflammatory markers, CRP is going up however clinically she is stable -Fever curve improving however overall her oxygen levels are decreasing.  Given multifocal pneumonia we will transfuse convalescent plasma.   COVID-19 Labs  Recent Labs    07/07/19 1410  07/08/19 0414 07/09/19 0419 07/10/19 0103  DDIMER 1.58*  --  1.19* 1.18* 1.38*  FERRITIN  --    < > 64 75 89  LDH 192  --   --   --   --   CRP  --    < > 14.8* 17.4* 16.4*   < > = values in this interval not displayed.   Active Problems OSA on BiPAP at home -BiPAP not to be used while Covid positive, will use  oxygen/high flow  Type 2 diabetes mellitus with peripheral neuropathy -11/30, she is on Levemir 18 units twice daily, sliding scale with nighttime correction -CBGs persistently elevated, increase Levemir to 22 U BID and add mealtime Novolog 4 U TID  CBG (last 3)  Recent Labs    07/09/19 2006 07/09/19 2140 07/10/19 0718  GLUCAP 399* 340* 252*   Diabetic foot ulcer -Chronic, also has a fracture of the left metatarsal head -Followed by podiatry as an outpatient, currently on offloading boot and local dressing.  Clinically improving per patient and family  Hypertension -Blood pressure well controlled  Lymphocytic colitis -She has severe disease, she is on budesonide chronically and will continue -She is also on methotrexate at home which is now on hold -As needed loperamide  Gram-positive bacteremia -1/4 blood cultures positive for GPC's.  Continue vancomycin until speciation  Physical debility -PT/OT consults  Scheduled Meds: . acidophilus  1 capsule Oral Daily  . allopurinol  100 mg Oral BID  . aspirin EC  81 mg Oral QODAY  . budesonide  9 mg Oral Daily  . cholecalciferol  1,000 Units Oral BID  . citalopram  40 mg Oral Daily  . cyclobenzaprine  10 mg Oral QHS  . dextromethorphan-guaiFENesin  1 tablet Oral BID  . enoxaparin (LOVENOX) injection  60 mg Subcutaneous Q24H  . folic acid  1 mg Oral Daily  . lisinopril  20 mg Oral Daily  And  . hydrochlorothiazide  25 mg Oral Daily  . insulin aspart  0-15 Units Subcutaneous TID WC  . insulin aspart  0-5 Units Subcutaneous QHS  . insulin aspart  4 Units Subcutaneous TID WC  . insulin detemir  22 Units Subcutaneous BID  . loperamide  6 mg Oral BID  . Melatonin  5 mg Oral QHS  . methylPREDNISolone (SOLU-MEDROL) injection  60 mg Intravenous Q12H  . multivitamin with minerals  1 tablet Oral Daily  . mupirocin cream   Topical Daily  . pantoprazole  40 mg Oral Daily  . potassium & sodium phosphates  1 packet Oral TID WC & HS   . potassium chloride  10 mEq Oral Daily  . vitamin B-12  1,000 mcg Oral Daily   Continuous Infusions: . remdesivir 100 mg in NS 250 mL    . vancomycin 750 mg (07/10/19 0837)   PRN Meds:.acetaminophen  DVT prophylaxis: Lovenox Code Status: Full code Family Communication: Discussed with daughter [place] [phone] Disposition Plan: TBD  Consultants:  None  Procedures:  None  Microbiology: SARS-CoV-2 +11/27 Blood cultures 11/27-1/4 bottles GPC  Antimicrobials: Remdesivir 11/29 >> will finish on 12/3 Vancomycin 11/29 >>  Objective: Vitals:   07/09/19 2003 07/10/19 0340 07/10/19 0451 07/10/19 0720  BP: 127/69 124/64  133/80  Pulse: 76 87  80  Resp: 18 20  18   Temp: 98.7 F (37.1 C) 98.3 F (36.8 C)  98.2 F (36.8 C)  TempSrc: Oral Oral    SpO2: 94% 93%  90%  Weight:   123.5 kg   Height:        Intake/Output Summary (Last 24 hours) at 07/10/2019 1149 Last data filed at 07/10/2019 0100 Gross per 24 hour  Intake 1426.01 ml  Output 900 ml  Net 526.01 ml   Filed Weights   07/07/19 1232 07/08/19 0602 07/10/19 0451  Weight: 117.9 kg 117.2 kg 123.5 kg    Examination:  Constitutional: No distress, appears comfortable, eating breakfast Eyes: no scleral icterus ENMT: Moist mucous membranes Neck: normal, supple Respiratory: Diminished at the bases, no wheezing, no rhonchi, no crackles.  Moves air well Cardiovascular: Regular rate and rhythm, no murmurs / rubs / gallops. No LE edema.  Abdomen: non distended, no tenderness. Bowel sounds positive.  Musculoskeletal: no clubbing / cyanosis.  Skin: no rashes, patient deferred foot ulcer exam Neurologic: No focal deficits Psychiatric: Normal judgment and insight. Alert and oriented x 3. Normal mood.    Data Reviewed: I have independently reviewed following labs and imaging studies   CBC: Recent Labs  Lab 07/07/19 1410 07/08/19 0414 07/09/19 0933 07/10/19 0103  WBC 6.9 7.0 6.0 4.6  NEUTROABS 5.2  --   4.2 3.4  HGB 12.3 12.0 12.0 10.9*  HCT 39.1 38.7 37.6 33.0*  MCV 92.0 93.0 90.8 88.9  PLT 185 152 151 [id number]   Basic Metabolic Panel: Recent Labs  Lab 07/07/19 1410 07/08/19 0414 07/09/19 0419 07/10/19 0103  NA 130* 130* 130* 132*  K 3.0* 3.4* 3.7 3.5  CL 90* 94* 95* 97*  CO2 27 25 26 25   GLUCOSE 161* 210* 203* 302*  BUN 13 10 11 15   CREATININE 0.79 0.64 0.74 0.82  CALCIUM 8.8* 8.1* 7.9* 8.1*  MG  --   --  1.7 1.6*  PHOS  --   --  1.9*  --    GFR: Estimated Creatinine Clearance: 82 mL/min (by C-G formula based on SCr of 0.82 mg/dL). Liver Function Tests: Recent Labs  Lab  07/07/19 1410 07/08/19 0414 07/10/19 0103  AST 36 28 29  ALT 41 32 29  ALKPHOS 62 52 43  BILITOT 0.6 0.6 0.7  PROT 6.4* 5.9* 5.5*  ALBUMIN 3.1* 2.9* 2.4*   No results for input(s): LIPASE, AMYLASE in the last 168 hours. No results for input(s): AMMONIA in the last 168 hours. Coagulation Profile: No results for input(s): INR, PROTIME in the last 168 hours. Cardiac Enzymes: No results for input(s): CKTOTAL, CKMB, CKMBINDEX, TROPONINI in the last 168 hours. BNP (last 3 results) No results for input(s): PROBNP in the last 8760 hours. HbA1C: Recent Labs    07/07/19 1956  HGBA1C 9.3*   CBG: Recent Labs  Lab 07/09/19 1143 07/09/19 1636 07/09/19 2006 07/09/19 2140 07/10/19 0718  GLUCAP 163* 398* 399* 340* 252*   Lipid Profile: Recent Labs    07/07/19 1411  TRIG 93   Thyroid Function Tests: Recent Labs    07/07/19 1956  TSH 1.124   Anemia Panel: Recent Labs    07/09/19 0419 07/10/19 0103  FERRITIN 75 89   Urine analysis:    Component Value Date/Time   COLORURINE YELLOW 07/07/2019 1850   APPEARANCEUR HAZY (A) 07/07/2019 1850   LABSPEC 1.019 07/07/2019 1850   PHURINE 6.0 07/07/2019 1850   GLUCOSEU 50 (A) 07/07/2019 1850   HGBUR NEGATIVE 07/07/2019 1850   BILIRUBINUR NEGATIVE 07/07/2019 1850   KETONESUR NEGATIVE 07/07/2019 1850   PROTEINUR NEGATIVE 07/07/2019 1850    UROBILINOGEN 1.0 09/06/2012 1046   NITRITE POSITIVE (A) 07/07/2019 1850   LEUKOCYTESUR NEGATIVE 07/07/2019 1850   Sepsis Labs: Invalid input(s): PROCALCITONIN, LACTICIDVEN  Recent Results (from the past 240 hour(s))  Blood Culture (routine x 2)     Status: None (Preliminary result)   Collection Time: 07/07/19  2:10 PM   Specimen: BLOOD  Result Value Ref Range Status   Specimen Description   Final    BLOOD LEFT ANTECUBITAL Performed at [facility] [address], [place], [place] [address]    Special Requests   Final    BOTTLES DRAWN AEROBIC AND ANAEROBIC Blood Culture adequate volume Performed at [facility] [address], [place], [place] [address]    Culture   Final    NO GROWTH 3 DAYS Performed at [facility], [place] [address], [place], [place] [address]    Report Status PENDING  Incomplete  Blood Culture (routine x 2)     Status: None (Preliminary result)   Collection Time: 07/07/19  2:11 PM   Specimen: BLOOD  Result Value Ref Range Status   Specimen Description   Final    BLOOD LEFT ANTECUBITAL Performed at [facility] [address], [place], [place] [address]    Special Requests   Final    BOTTLES DRAWN AEROBIC AND ANAEROBIC Blood Culture adequate volume Performed at [facility] [address], [place], [place] [address]    Culture  Setup Time   Final    GRAM POSITIVE COCCI CRITICAL RESULT CALLED TO, READ BACK BY AND VERIFIED WITH: PHARM D BETH GREEN AT [facility] ON 07/09/2019    Culture   Final    CULTURE REINCUBATED FOR BETTER GROWTH Performed at [facility], [place] [address], [place],  [address]    Report Status PENDING  Incomplete      Radiology Studies: Dg Chest Port 1 View  Result Date: 07/09/2019 CLINICAL DATA:  COVID-19 positive patient. EXAM: PORTABLE CHEST 1  VIEW COMPARISON:  July 07, 2019 FINDINGS: Bibasilar pulmonary  infiltrates are worsened in the interval. The heart, hila, mediastinum, lungs, and pleura are otherwise unremarkable. IMPRESSION: There are bilateral pulmonary infiltrates which have worsened in the interval. Recommend attention on follow-up. Electronically Signed   By: Dorise Bullion III M.D   On: 07/09/2019 11:04   Dg Foot Complete Left  Result Date: 07/09/2019 CLINICAL DATA:  Left foot pain.  No known injury. EXAM: LEFT FOOT - COMPLETE 3+ VIEW COMPARISON:  None. FINDINGS: A hallux valgus deformity is identified. Soft tissue swelling is seen over the dorsum of the foot. There is a fracture through the distal third metatarsal. Scattered degenerative changes. No other fractures. No other acute abnormalities. IMPRESSION: There is a fracture through the distal third metatarsal with overlying soft tissue swelling. Electronically Signed   By: Dorise Bullion III M.D   On: 07/09/2019 11:10    Marzetta Board, MD, PhD [facility]  Contact via  [email or web address]  [place] P: [phone] F: [phone]

## 2019-07-10 NOTE — Consult Note (Addendum)
[facility] Nurse Consult Note: Consult performed remotely; pt is on isolation for Covid.  Reviewed photos and progress notes in the EMR. Reason for Consult: Consult requested for 2 wounds on left foot. Wound type: Left plantar foot with chronic full thickness wound; 2X4cm according to nursing wound flow sheet.  Dry red wound bed, surrounded by dry scabbed raised callous to wound edge. Full thickness wound between 4th and 5th toe spaces; appears to be approx .2X.2X.2cm, dry red wound bed.   Dressing procedure/placement/frequency: Topical treatment orders provided for the bedside nurses to perform daily as follows to promote moist healing: Apply Bactroban to left plantar foot and between toes, tuck 2X2 in between toes, then cover both sites with foam dressing.  (Change foam dressing Q 3 days or PRN soiling.) Please re-consult if further assistance is needed.  Thank-you,  Julien Girt MSN, [place], [place], [employer], [place]

## 2019-07-11 ENCOUNTER — Inpatient Hospital Stay (HOSPITAL_COMMUNITY): Payer: Medicare Other

## 2019-07-11 LAB — D-DIMER, QUANTITATIVE: D-Dimer, Quant: 0.76 ug/mL-FEU — ABNORMAL HIGH (ref 0.00–0.50)

## 2019-07-11 LAB — CULTURE, BLOOD (ROUTINE X 2): Special Requests: ADEQUATE

## 2019-07-11 LAB — COMPREHENSIVE METABOLIC PANEL
ALT: 32 U/L (ref 0–44)
AST: 32 U/L (ref 15–41)
Albumin: 2.3 g/dL — ABNORMAL LOW (ref 3.5–5.0)
Alkaline Phosphatase: 47 U/L (ref 38–126)
Anion gap: 13 (ref 5–15)
BUN: 19 mg/dL (ref 8–23)
CO2: 23 mmol/L (ref 22–32)
Calcium: 8.8 mg/dL — ABNORMAL LOW (ref 8.9–10.3)
Chloride: 96 mmol/L — ABNORMAL LOW (ref 98–111)
Creatinine, Ser: 0.71 mg/dL (ref 0.44–1.00)
GFR calc Af Amer: >60 mL/min (ref >60)
GFR calc non Af Amer: >60 mL/min (ref >60)
Glucose, Bld: 279 mg/dL — ABNORMAL HIGH (ref 70–99)
Potassium: 3.5 mmol/L (ref 3.5–5.1)
Sodium: 132 mmol/L — ABNORMAL LOW (ref 135–145)
Total Bilirubin: 0.4 mg/dL (ref 0.3–1.2)
Total Protein: 5.5 g/dL — ABNORMAL LOW (ref 6.5–8.1)

## 2019-07-11 LAB — BPAM FFP
Blood Product Expiration Date: 202012011740
ISSUE DATE / TIME: 202011301902
Unit Type and Rh: 5100

## 2019-07-11 LAB — GLUCOSE, CAPILLARY
Glucose-Capillary: 232 mg/dL — ABNORMAL HIGH (ref 70–99)
Glucose-Capillary: 309 mg/dL — ABNORMAL HIGH (ref 70–99)
Glucose-Capillary: 355 mg/dL — ABNORMAL HIGH (ref 70–99)
Glucose-Capillary: 346 mg/dL — ABNORMAL HIGH (ref 70–99)

## 2019-07-11 LAB — PREPARE FRESH FROZEN PLASMA

## 2019-07-11 LAB — CBC
HCT: 31.9 % — ABNORMAL LOW (ref 36.0–46.0)
Hemoglobin: 10.6 g/dL — ABNORMAL LOW (ref 12.0–15.0)
MCH: 29.2 pg (ref 26.0–34.0)
MCHC: 33.2 g/dL (ref 30.0–36.0)
MCV: 87.9 fL (ref 80.0–100.0)
Platelets: 191 10*3/uL (ref 150–400)
RBC: 3.63 MIL/uL — ABNORMAL LOW (ref 3.87–5.11)
RDW: 16.4 % — ABNORMAL HIGH (ref 11.5–15.5)
WBC: 7.3 10*3/uL (ref 4.0–10.5)
nRBC: 0 % (ref 0.0–0.2)

## 2019-07-11 LAB — MAGNESIUM: Magnesium: 1.9 mg/dL (ref 1.7–2.4)

## 2019-07-11 LAB — C-REACTIVE PROTEIN: CRP: 8.6 mg/dL — ABNORMAL HIGH (ref <1.0)

## 2019-07-11 MED ORDER — INSULIN ASPART 100 UNIT/ML ~~LOC~~ SOLN
6.0000 [IU] | Freq: Three times a day (TID) | SUBCUTANEOUS | Status: DC
  Administered 2019-07-11: 6 [IU] via SUBCUTANEOUS

## 2019-07-11 MED ORDER — FUROSEMIDE 10 MG/ML IJ SOLN
40.0000 mg | Freq: Once | INTRAMUSCULAR | Status: AC
  Administered 2019-07-11: 40 mg via INTRAVENOUS
  Filled 2019-07-11: qty 4

## 2019-07-11 MED ORDER — INSULIN DETEMIR 100 UNIT/ML ~~LOC~~ SOLN
25.0000 [IU] | Freq: Two times a day (BID) | SUBCUTANEOUS | Status: DC
  Administered 2019-07-11: 25 [IU] via SUBCUTANEOUS
  Filled 2019-07-11 (×2): qty 0.25

## 2019-07-11 NOTE — CV Procedure (Signed)
Lower extremity venous duplex not completed due to personal care.   Darlina Sicilian RDCS

## 2019-07-11 NOTE — Progress Notes (Addendum)
PROGRESS NOTE  [name] [name] [id number] DOB: November 03, 1945 DOA: 07/07/2019 PCP: Leeroy Cha, MD   LOS: 4 days   Brief Narrative / Interim history: 73 year old female with history of obesity, OSA, DM, HTN, HLD, lymphocytic colitis, DVT in 2013, presented to the hospital with significant weakness, generalized body aches as well as a low-grade fever.  She was also complaining of a dry cough and significant rhinorrhea.  She was found to be Covid positive and a chest x-ray showed bilateral pulmonary infiltrates.  She was started on steroids as well as remdesivir and admitted to the hospital.  Subjective / 24h Interval events: No complaints, eating breakfast.  Denies any shortness of breath, no cough, no chest congestion  Assessment & Plan: Principal Problem:   Pneumonia due to COVID-19 virus Active Problems:   OSA (obstructive sleep apnea)   Essential hypertension   DM2 (diabetes mellitus, type 2) (HCC)   Diabetic foot ulcer ([place])   Ischemic colitis ([place])   COVID-19   Physical deconditioning   Principal Problem Acute hypoxic respiratory failure due to Covid-19 Viral multifocal pneumonia -Continue steroids -Started on remdesivir for 5 days with end date on 07/13/2019, today day 3/5 -She is status post convalescent plasma 11/30 -She is afebrile and satting in the low 90s on room air however today she required a liter nasal cannula -Incentive spirometry, flutter valve -Continue to monitor inflammatory markers, CRP decreasing over the last couple days -Given oxygen needs today we will repeat a chest x-ray.  She is net positive and will give Lasix x1 -Given asymmetric lower extremity swelling obtain venous Doppler to rule out DVT  COVID-19 Labs  Recent Labs    07/09/19 0419 07/10/19 0103 07/11/19 0135  DDIMER 1.18* 1.38* 0.76*  FERRITIN 75 89  --   CRP 17.4* 16.4* 8.6*   Active Problems OSA on BiPAP at home -BiPAP not to be used while Covid positive, will use  oxygen/high flow  Type 2 diabetes mellitus with peripheral neuropathy -Insulin regimen requires daily up titration, today increase Levemir to 25 twice daily and scheduled NovoLog to 6 units plus sliding scale  CBG (last 3)  Recent Labs    07/10/19 2043 07/11/19 0838 07/11/19 1129  GLUCAP 382* 232* 309*   Diabetic foot ulcer -Chronic, also has a fracture of the left metatarsal head -Followed by podiatry as an outpatient, currently on offloading boot and local dressing.  Clinically improving per patient and family, pictures reviewed in the chart done on admission  Hypertension -Blood pressure well controlled  Lymphocytic colitis -She has severe disease, she is on budesonide chronically and will continue -She is also on methotrexate at home which is now on hold -On loperamide, discontinued today due to constipation  Gram-positive bacteremia, coag negative staph -1/4 blood cultures positive for GPC's, speciated coag negative staph today.  Likely contaminant, discontinue vancomycin  Physical debility -PT/OT consult   Scheduled Meds: . acidophilus  1 capsule Oral Daily  . allopurinol  100 mg Oral BID  . aspirin EC  81 mg Oral QODAY  . budesonide  9 mg Oral Daily  . cholecalciferol  1,000 Units Oral BID  . citalopram  40 mg Oral Daily  . cyclobenzaprine  10 mg Oral QHS  . dextromethorphan-guaiFENesin  1 tablet Oral BID  . enoxaparin (LOVENOX) injection  60 mg Subcutaneous Q24H  . folic acid  1 mg Oral Daily  . lisinopril  20 mg Oral Daily   And  . hydrochlorothiazide  25 mg Oral Daily  .  insulin aspart  0-15 Units Subcutaneous TID WC  . insulin aspart  0-5 Units Subcutaneous QHS  . insulin aspart  6 Units Subcutaneous TID WC  . insulin detemir  25 Units Subcutaneous BID  . Melatonin  5 mg Oral QHS  . methylPREDNISolone (SOLU-MEDROL) injection  60 mg Intravenous Q12H  . multivitamin with minerals  1 tablet Oral Daily  . mupirocin cream   Topical Daily  . pantoprazole  40  mg Oral Daily  . potassium & sodium phosphates  1 packet Oral TID WC & HS  . potassium chloride  10 mEq Oral Daily  . vitamin B-12  1,000 mcg Oral Daily   Continuous Infusions: . remdesivir 100 mg in NS 250 mL 100 mg (07/11/19 0943)   PRN Meds:.acetaminophen  DVT prophylaxis: Lovenox Code Status: Full code Family Communication: Discussed with daughter [place] [phone] Disposition Plan: TBD  Consultants:  None  Procedures:  None  Microbiology: SARS-CoV-2 +11/27 Blood cultures 11/27-1/4 bottles GPC, coag negative staph  Antimicrobials: Remdesivir 11/29 >> will finish on 12/3 Vancomycin 11/29 >> 12/1  Objective: Vitals:   07/11/19 0437 07/11/19 0500 07/11/19 0814 07/11/19 1010  BP: 129/74  127/69   Pulse: 80  85   Resp: 20  18   Temp: 97.8 F (36.6 C)  98.4 F (36.9 C)   TempSrc: Oral  Oral   SpO2: 90%  92% 92%  Weight:  124 kg    Height:        Intake/Output Summary (Last 24 hours) at 07/11/2019 1551 Last data filed at 07/11/2019 1543 Gross per 24 hour  Intake 1520 ml  Output 3600 ml  Net -2080 ml   Filed Weights   07/08/19 0602 07/10/19 0451 07/11/19 0500  Weight: 117.2 kg 123.5 kg 124 kg    Examination:  Constitutional: NAD, eating breakfast Eyes: No scleral icterus ENMT: Moist mucous membranes Neck: normal, supple Respiratory: Diminished at the bases, no wheezing, no rhonchi Cardiovascular: Regular rate and rhythm, no murmurs.  1+ lower extremity edema Abdomen: Soft, nontender, nondistended, positive bowel sounds Musculoskeletal: no clubbing / cyanosis.  Skin: No rashes seen, left foot with boot Neurologic: Nonfocal, equal strength Psychiatric: Normal judgment and insight. Alert and oriented x 3. Normal mood.    Data Reviewed: I have independently reviewed following labs and imaging studies   CBC: Recent Labs  Lab 07/07/19 1410 07/08/19 0414 07/09/19 0933 07/10/19 0103 07/11/19 0135  WBC 6.9 7.0 6.0 4.6 7.3  NEUTROABS 5.2   --  4.2 3.4  --   HGB 12.3 12.0 12.0 10.9* 10.6*  HCT 39.1 38.7 37.6 33.0* 31.9*  MCV 92.0 93.0 90.8 88.9 87.9  PLT 185 152 151 169 [id number]   Basic Metabolic Panel: Recent Labs  Lab 07/07/19 1410 07/08/19 0414 07/09/19 0419 07/10/19 0103 07/11/19 0135  NA 130* 130* 130* 132* 132*  K 3.0* 3.4* 3.7 3.5 3.5  CL 90* 94* 95* 97* 96*  CO2 27 25 26 25 23   GLUCOSE 161* 210* 203* 302* 279*  BUN 13 10 11 15 19   CREATININE 0.79 0.64 0.74 0.82 0.71  CALCIUM 8.8* 8.1* 7.9* 8.1* 8.8*  MG  --   --  1.7 1.6* 1.9  PHOS  --   --  1.9*  --   --    GFR: Estimated Creatinine Clearance: 84.2 mL/min (by C-G formula based on SCr of 0.71 mg/dL). Liver Function Tests: Recent Labs  Lab 07/07/19 1410 07/08/19 0414 07/10/19 0103 07/11/19 0135  AST 36 28 29  32  ALT 41 32 29 32  ALKPHOS 62 52 43 47  BILITOT 0.6 0.6 0.7 0.4  PROT 6.4* 5.9* 5.5* 5.5*  ALBUMIN 3.1* 2.9* 2.4* 2.3*   No results for input(s): LIPASE, AMYLASE in the last 168 hours. No results for input(s): AMMONIA in the last 168 hours. Coagulation Profile: No results for input(s): INR, PROTIME in the last 168 hours. Cardiac Enzymes: No results for input(s): CKTOTAL, CKMB, CKMBINDEX, TROPONINI in the last 168 hours. BNP (last 3 results) No results for input(s): PROBNP in the last 8760 hours. HbA1C: No results for input(s): HGBA1C in the last 72 hours. CBG: Recent Labs  Lab 07/10/19 1201 07/10/19 1536 07/10/19 2043 07/11/19 0838 07/11/19 1129  GLUCAP 248* 339* 382* 232* 309*   Lipid Profile: No results for input(s): CHOL, HDL, LDLCALC, TRIG, CHOLHDL, LDLDIRECT in the last 72 hours. Thyroid Function Tests: No results for input(s): TSH, T4TOTAL, FREET4, T3FREE, THYROIDAB in the last 72 hours. Anemia Panel: Recent Labs    07/09/19 0419 07/10/19 0103  FERRITIN 75 89   Urine analysis:    Component Value Date/Time   COLORURINE YELLOW 07/07/2019 1850   APPEARANCEUR HAZY (A) 07/07/2019 1850   LABSPEC 1.019 07/07/2019 1850    PHURINE 6.0 07/07/2019 1850   GLUCOSEU 50 (A) 07/07/2019 1850   HGBUR NEGATIVE 07/07/2019 1850   BILIRUBINUR NEGATIVE 07/07/2019 1850   KETONESUR NEGATIVE 07/07/2019 1850   PROTEINUR NEGATIVE 07/07/2019 1850   UROBILINOGEN 1.0 09/06/2012 1046   NITRITE POSITIVE (A) 07/07/2019 1850   LEUKOCYTESUR NEGATIVE 07/07/2019 1850   Sepsis Labs: Invalid input(s): PROCALCITONIN, LACTICIDVEN  Recent Results (from the past 240 hour(s))  Blood Culture (routine x 2)     Status: None (Preliminary result)   Collection Time: 07/07/19  2:10 PM   Specimen: BLOOD  Result Value Ref Range Status   Specimen Description   Final    BLOOD LEFT ANTECUBITAL Performed at [facility] [address], [place], [place] [address]    Special Requests   Final    BOTTLES DRAWN AEROBIC AND ANAEROBIC Blood Culture adequate volume Performed at [facility] [address], [place], [place] [address]    Culture   Final    NO GROWTH 4 DAYS Performed at [facility], [place] [address] [place], [place] [address]    Report Status PENDING  Incomplete  Blood Culture (routine x 2)     Status: Abnormal   Collection Time: 07/07/19  2:11 PM   Specimen: BLOOD  Result Value Ref Range Status   Specimen Description   Final    BLOOD LEFT ANTECUBITAL Performed at [facility] [address] [place], [place] [address]    Special Requests   Final    BOTTLES DRAWN AEROBIC AND ANAEROBIC Blood Culture adequate volume Performed at [facility] [address], [place], [place] [address]    Culture  Setup Time   Final    GRAM POSITIVE COCCI CRITICAL RESULT CALLED TO, READ BACK BY AND VERIFIED WITH: PHARM D BETH GREEN AT 0305 BY [place] ON 07/09/2019    Culture (A)  Final    STAPHYLOCOCCUS SPECIES (COAGULASE NEGATIVE) THE SIGNIFICANCE OF ISOLATING THIS ORGANISM FROM A SINGLE SET OF BLOOD CULTURES WHEN MULTIPLE SETS ARE DRAWN IS UNCERTAIN.  PLEASE NOTIFY THE MICROBIOLOGY DEPARTMENT WITHIN ONE WEEK IF SPECIATION AND SENSITIVITIES ARE REQUIRED. Performed at [facility], [place] [address], [place], [place] [address]    Report  Status 07/11/2019 FINAL  Final      Radiology Studies: Dg Chest Port 1 View  Result Date: 07/11/2019 CLINICAL DATA:  Hypoxemia. EXAM: PORTABLE CHEST 1 VIEW COMPARISON:  07/09/2019 and earlier exams. FINDINGS: Cardiac silhouette is top-normal in size. No mediastinal or hilar masses or evidence of adenopathy. For Left lung base opacity is similar to the previous exam. Previously seen right lung base opacity has improved. No new lung abnormalities. No visualized pleural effusion. No pneumothorax. Skeletal structures are grossly intact. IMPRESSION: 1. Left lung base opacity, which is similar to the most recent prior exam. This is consistent with pneumonia or atelectasis. Previously noted right base opacity has improved consistent with improved/resolved atelectasis. Electronically Signed   By: Lajean Manes M.D.   On: 07/11/2019 13:01    Marzetta Board, MD, PhD [facility]  Contact via  [email or web address]  [place] P: [phone] F: [phone]

## 2019-07-11 NOTE — Progress Notes (Signed)
Inpatient Diabetes Program Recommendations  AACE/ADA: New Consensus Statement on Inpatient Glycemic Control (2015)  Target Ranges:  Prepandial:   less than 140 mg/dL      Peak postprandial:   less than 180 mg/dL (1-2 hours)      Critically ill patients:  140 - 180 mg/dL   Lab Results  Component Value Date   GLUCAP 309 (H) 07/11/2019   HGBA1C 9.3 (H) 07/07/2019  Results for [name], [name] (MRN [id number]) as of 07/11/2019 14:03  Ref. Range 07/10/2019 12:01 07/10/2019 15:36 07/10/2019 20:43 07/11/2019 08:38 07/11/2019 11:29  Glucose-Capillary Latest Ref Range: 70 - 99 mg/dL 248 (H) 339 (H) 382 (H) 232 (H) 309 (H)   Diabetes history: DM 2 Outpatient Diabetes medications:  Levemir 14 units bid, Metformin XR 500 mg bid Current orders for Inpatient glycemic control:  Novolog moderate tid with meals and HS Novolog 4 units tid with meals Levemir 22 units bid Solumedrol 60 mg q 12 hours  Inpatient Diabetes Program Recommendations:    May consider increasing Levemir to 30 units bid.  Also consider increasing Novolog meal coverage to 6 units tid with meals.  ? Adding Tradjenta 5 mg daily as well.   Thanks,  [name] Perl, RN, BC-ADM Inpatient Diabetes Coordinator Pager [phone] (8a-5p)

## 2019-07-11 NOTE — Progress Notes (Signed)
Daughter was updated over the phone this afternoon & all questions were answered. She seemed very pleased. Pt had a repeat CXRAY done today- results pending. [place] BLLE ordered d/t increased leg swelling. 40mg  IV lasix given -see flowsheet for OP record. Pt sat up in chair most of afternoon. No c/o pain. No BM. IV remdesivir, vanc & solumedrol given.  Pt had a full bath & linen change. Purewic replaced.

## 2019-07-11 NOTE — Progress Notes (Signed)
Occupational Therapy Treatment Patient Details Name: ADAIR BADMAN MRN: [id number] DOB: 07/12/1946 Today's Date: 07/11/2019    History of present illness 73 year old female was admitted for extreme weakness, + COVID.  PMH:  morbid obesity, HTN, DM, peripheral neuropathy, L foot wound--wearing boot   OT comments  Completed ADL with NT prior to session. On RA for mobility after ADL and desats briefly to 88 with mobility but quickly rebounds to 90s with 1/4 DOE. Pt requires min A for ADL but states she feels much better. Pt's husband was recently released from [facility] Recommend updating DC plan to include [place]. Will continue to follow acutely.   Follow Up Recommendations  Supervision/Assistance - 24 hour;Home health OT    Equipment Recommendations  None recommended by OT    Recommendations for Other Services      Precautions / Restrictions Precautions Precautions: Fall Required Braces or Orthoses: Other Brace Other Brace: cam boot, patient states that she wears sneakers not CAM , if not walking too far. patient requested snwakers this visit.       Mobility Bed Mobility               General bed mobility comments: OOB in chair  Transfers Overall transfer level: Needs assistance Equipment used: Rolling walker (2 wheeled) Transfers: Sit to/from Stand Sit to Stand: Supervision              Balance     Sitting balance-Leahy Scale: Good       Standing balance-Leahy Scale: Fair                             ADL either performed or assessed with clinical judgement   ADL Overall ADL's : Needs assistance/impaired     Grooming: Set up;Sitting   Upper Body Bathing: Set up;Sitting   Lower Body Bathing: Minimal assistance;Sit to/from stand   Upper Body Dressing : Set up;Sitting   Lower Body Dressing: Moderate assistance;Sit to/from stand         Toileting - Clothing Manipulation Details (indicate cue type and reason): Pt declining to have purewick  removed to transfer onto the toilet becuase she "has to go all of the time" Attempted to educate pt on need to use BSC instead of relying on purewick     Functional mobility during ADLs: Supervision/safety;Rolling walker       Vision       Perception     Praxis      Cognition Arousal/Alertness: Awake/alert Behavior During Therapy: WFL for tasks assessed/performed Overall Cognitive Status: Within Functional Limits for tasks assessed                                          Exercises Exercises: Other exercises Other Exercises Other Exercises: Pt completing incentive spirometer adn flutter valve appropirately 10x/hr Other Exercises: Issued and educated pt on level 1 theraband HEP adn stadning ex HEP - encouraged pt ot complete several times/day. will address further   Shoulder Instructions       General Comments Spo2 88 briefly on RA    Pertinent Vitals/ Pain       Pain Assessment: Faces Faces Pain Scale: Hurts a little bit Pain Location: general discomfort Pain Descriptors / Indicators: Discomfort Pain Intervention(s): Limited activity within patient's tolerance  Home Living  Prior Functioning/Environment              Frequency  Min 3X/week        Progress Toward Goals  OT Goals(current goals can now be found in the care plan section)  Progress towards OT goals: Progressing toward goals  Acute Rehab OT Goals Patient Stated Goal: home asap OT Goal Formulation: With patient Time For Goal Achievement: 07/22/19 Potential to Achieve Goals: Good ADL Goals Pt Will Perform Grooming: with supervision;standing Pt Will Transfer to Toilet: with supervision;ambulating;bedside commode Pt Will Perform Toileting - Clothing Manipulation and hygiene: with supervision;sit to/from stand Additional ADL Goal #1: pt will perform ADL with set up/supervision with AE as needed  Plan Frequency needs  to be updated;Discharge plan needs to be updated    Co-evaluation                 AM-PAC OT "6 Clicks" Daily Activity     Outcome Measure   Help from another person eating meals?: None Help from another person taking care of personal grooming?: A Little Help from another person toileting, which includes using toliet, bedpan, or urinal?: A Little Help from another person bathing (including washing, rinsing, drying)?: A Little Help from another person to put on and taking off regular upper body clothing?: A Little Help from another person to put on and taking off regular lower body clothing?: A Little 6 Click Score: 19    End of Session Equipment Utilized During Treatment: Rolling walker  OT Visit Diagnosis: Muscle weakness (generalized) (M62.81)   Activity Tolerance Patient tolerated treatment well   Patient Left in chair;with call bell/phone within reach   Nurse Communication Mobility status        Time: [id number] OT Time Calculation (min): 23 min  Charges: OT General Charges $OT Visit: 1 Visit OT Treatments $Self Care/Home Management : 8-22 mins $Therapeutic Exercise: 8-22 mins  Maurie Boettcher, OT/L   Acute OT Clinical Specialist [place] Pager [phone] Office [phone]    [facility] 07/11/2019, 5:12 PM

## 2019-07-11 NOTE — TOC Initial Note (Signed)
ansition of Care [facility]) - Initial/Assessment Note    Patient Details  Name: [name] [name] MRN: [id number] Date of Birth: 1946-02-26  Transition of Care [facility]) CM/SW Contact:    Ninfa Meeker, RN Phone Number: 07/11/2019, 2:11 PM  Clinical Narrative:  Patient is a 73 yr old female from home, admitted with Acute hypoxic respiratory failure secondary to Covid 19 pneumonia.Has a left foot diabetic ulcer which is being followed by podiatry as outpatient.  Patient was initially on [place], now on RA. On Remdesivir until 07/13/19. Case manager will continue to monitor for needs. .                Patient Goals and CMS Choice        Expected Discharge Plan and Services                                                Prior Living Arrangements/Services                       Activities of Daily Living Home Assistive Devices/Equipment: None ADL Screening (condition at time of admission) Patient's cognitive ability adequate to safely complete daily activities?: Yes Is the patient deaf or have difficulty hearing?: No Does the patient have difficulty seeing, even when wearing glasses/contacts?: No Does the patient have difficulty concentrating, remembering, or making decisions?: No Patient able to express need for assistance with ADLs?: Yes Does the patient have difficulty dressing or bathing?: Yes Independently performs ADLs?: Yes (appropriate for developmental age) Does the patient have difficulty walking or climbing stairs?: Yes Weakness of Legs: Both Weakness of Arms/Hands: Both  Permission Sought/Granted                  Emotional Assessment              Admission diagnosis:  Hypokalemia [E87.6] Fatigue, unspecified type [R53.83] COVID-19 virus infection [U07.1] Patient Active Problem List   Diagnosis Date Noted  . Pneumonia due to COVID-19 virus 07/09/2019  . Physical deconditioning 07/08/2019  . COVID-19 07/07/2019  . Pain in left knee  10/13/2018  . Ischemic colitis ([place]) 10/06/2018  . Arthritis 07/21/2018  . Urinary incontinence 07/21/2018  . Diabetic foot ulcer ([place]) 07/13/2018  . Closed fracture of fifth metatarsal bone 05/13/2018  . Cellulitis of toe of left foot 05/02/2018  . DM2 (diabetes mellitus, type 2) ([place]) 05/02/2018  . Psoriasis 05/02/2018  . Pain in joint of right ankle 04/25/2018  . Osteoarthritis of knee 10/18/2017  . Chest pain 01/12/2015  . Right bundle branch block 05/20/2014  . OSA (obstructive sleep apnea) 03/12/2013  . Essential hypertension 03/12/2013  . Incomplete RBBB 03/12/2013  . Expected blood loss anemia 09/14/2012  . Obesity 09/14/2012  . Hyponatremia 09/14/2012  . S/P right THA, AA 09/13/2012  . Deep venous thrombosis of lower extremity ([place]) 08/10/2010  . 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

## 2019-07-12 ENCOUNTER — Inpatient Hospital Stay (HOSPITAL_COMMUNITY): Payer: Medicare Other

## 2019-07-12 DIAGNOSIS — E1169 Type 2 diabetes mellitus with other specified complication: Secondary | ICD-10-CM

## 2019-07-12 DIAGNOSIS — U071 COVID-19: Secondary | ICD-10-CM

## 2019-07-12 LAB — CBC
HCT: 33.9 % — ABNORMAL LOW (ref 36.0–46.0)
Hemoglobin: 11.2 g/dL — ABNORMAL LOW (ref 12.0–15.0)
MCH: 28.8 pg (ref 26.0–34.0)
MCHC: 33 g/dL (ref 30.0–36.0)
MCV: 87.1 fL (ref 80.0–100.0)
Platelets: 229 10*3/uL (ref 150–400)
RBC: 3.89 MIL/uL (ref 3.87–5.11)
RDW: 16.2 % — ABNORMAL HIGH (ref 11.5–15.5)
WBC: 8.8 10*3/uL (ref 4.0–10.5)
nRBC: 0 % (ref 0.0–0.2)

## 2019-07-12 LAB — CULTURE, BLOOD (ROUTINE X 2)
Culture: NO GROWTH
Special Requests: ADEQUATE

## 2019-07-12 LAB — COMPREHENSIVE METABOLIC PANEL
ALT: 35 U/L (ref 0–44)
AST: 36 U/L (ref 15–41)
Albumin: 2.4 g/dL — ABNORMAL LOW (ref 3.5–5.0)
Alkaline Phosphatase: 51 U/L (ref 38–126)
Anion gap: 16 — ABNORMAL HIGH (ref 5–15)
BUN: 23 mg/dL (ref 8–23)
CO2: 26 mmol/L (ref 22–32)
Calcium: 8.8 mg/dL — ABNORMAL LOW (ref 8.9–10.3)
Chloride: 93 mmol/L — ABNORMAL LOW (ref 98–111)
Creatinine, Ser: 0.71 mg/dL (ref 0.44–1.00)
GFR calc Af Amer: >60 mL/min (ref >60)
GFR calc non Af Amer: >60 mL/min (ref >60)
Glucose, Bld: 271 mg/dL — ABNORMAL HIGH (ref 70–99)
Potassium: 3 mmol/L — ABNORMAL LOW (ref 3.5–5.1)
Sodium: 135 mmol/L (ref 135–145)
Total Bilirubin: 0.4 mg/dL (ref 0.3–1.2)
Total Protein: 5.6 g/dL — ABNORMAL LOW (ref 6.5–8.1)

## 2019-07-12 LAB — GLUCOSE, CAPILLARY
Glucose-Capillary: 219 mg/dL — ABNORMAL HIGH (ref 70–99)
Glucose-Capillary: 246 mg/dL — ABNORMAL HIGH (ref 70–99)
Glucose-Capillary: 357 mg/dL — ABNORMAL HIGH (ref 70–99)

## 2019-07-12 LAB — C-REACTIVE PROTEIN: CRP: 3.8 mg/dL — ABNORMAL HIGH (ref <1.0)

## 2019-07-12 LAB — D-DIMER, QUANTITATIVE: D-Dimer, Quant: 0.91 ug/mL-FEU — ABNORMAL HIGH (ref 0.00–0.50)

## 2019-07-12 MED ORDER — INSULIN ASPART 100 UNIT/ML ~~LOC~~ SOLN
8.0000 [IU] | Freq: Three times a day (TID) | SUBCUTANEOUS | Status: DC
  Administered 2019-07-12 – 2019-07-13 (×5): 8 [IU] via SUBCUTANEOUS

## 2019-07-12 MED ORDER — METHYLPREDNISOLONE SODIUM SUCC 40 MG IJ SOLR
40.0000 mg | Freq: Two times a day (BID) | INTRAMUSCULAR | Status: DC
  Administered 2019-07-12 – 2019-07-13 (×2): 40 mg via INTRAVENOUS
  Filled 2019-07-12 (×2): qty 1

## 2019-07-12 MED ORDER — POTASSIUM CHLORIDE CRYS ER 20 MEQ PO TBCR
30.0000 meq | EXTENDED_RELEASE_TABLET | Freq: Four times a day (QID) | ORAL | Status: AC
  Administered 2019-07-12 (×2): 30 meq via ORAL
  Filled 2019-07-12 (×2): qty 2

## 2019-07-12 MED ORDER — INSULIN DETEMIR 100 UNIT/ML ~~LOC~~ SOLN
30.0000 [IU] | Freq: Two times a day (BID) | SUBCUTANEOUS | Status: DC
  Administered 2019-07-12 – 2019-07-13 (×3): 30 [IU] via SUBCUTANEOUS
  Filled 2019-07-12 (×4): qty 0.3

## 2019-07-12 NOTE — Progress Notes (Signed)
Physical Therapy Treatment Patient Details Name: [name] [name] MRN: [id number] DOB: [birth year] Today's Date: 07/12/2019    History of Present Illness 73 year old female was admitted for extreme weakness, + COVID.  PMH:  morbid obesity, HTN, DM, peripheral neuropathy, L foot wound--wearing boot. Negative DVT LLE    PT Comments    The patient is progressing but reports feels weaker. Assisted to recliner. Will ambualte after IV meds are finished.   Follow Up Recommendations  No PT follow up     Equipment Recommendations  None recommended by PT    Recommendations for Other Services       Precautions / Restrictions Precautions Precautions: Fall Precaution Comments: monitor on RA Other Brace: cam boot, patient states that she wears sneakers not CAM , if not walking too far. patient requested sneakers this visit.    Mobility  Bed Mobility Overal bed mobility: Needs Assistance       Supine to sit: HOB elevated;Supervision        Transfers Overall transfer level: Needs assistance Equipment used: Rolling walker (2 wheeled) Transfers: Sit to/from Stand Sit to Stand: Supervision         General transfer comment: extra effort from low bed  Ambulation/Gait Ambulation/Gait assistance: Min guard Gait Distance (Feet): 5 Feet Assistive device: Rolling walker (2 wheeled) Gait Pattern/deviations: Step-to pattern;Step-through pattern;Decreased step length - right;Decreased step length - left;Shuffle;Trunk flexed Gait velocity: decr   General Gait Details: patient used sneakers, managed RW with turns moved to recliner until IV runs out   Chief Strategy Officer    Modified Rankin (Stroke Patients Only)       Balance Overall balance assessment: Needs assistance   Sitting balance-Leahy Scale: Good     Standing balance support: Bilateral upper extremity supported Standing balance-Leahy Scale: Fair                               Cognition Arousal/Alertness: Awake/alert                                            Exercises      General Comments        Pertinent Vitals/Pain      Home Living                      Prior Function            PT Goals (current goals can now be found in the care plan section) Progress towards PT goals: Progressing toward goals    Frequency    Min 3X/week      PT Plan Current plan remains appropriate    Co-evaluation              AM-PAC PT "6 Clicks" Mobility   Outcome Measure  Help needed turning from your back to your side while in a flat bed without using bedrails?: None Help needed moving from lying on your back to sitting on the side of a flat bed without using bedrails?: None Help needed moving to and from a bed to a chair (including a wheelchair)?: None Help needed standing up from a chair using your arms (e.g., wheelchair or bedside chair)?: A Little Help needed to walk in hospital room?:  A Little Help needed climbing 3-5 steps with a railing? : A Little 6 Click Score: 21    End of Session Equipment Utilized During Treatment: Oxygen Activity Tolerance: Patient tolerated treatment well;Patient limited by fatigue Patient left: in chair;with call bell/phone within reach Nurse Communication: Mobility status PT Visit Diagnosis: Difficulty in walking, not elsewhere classified (R26.2);Muscle weakness (generalized) (M62.81)     Time: [id number] PT Time Calculation (min) (ACUTE ONLY): 15 min  Charges:  $Gait Training: 8-22 mins                     [place] Pager [phone] Office [phone]    Claretha Cooper 07/12/2019, 4:58 PM

## 2019-07-12 NOTE — Progress Notes (Signed)
PROGRESS NOTE  [name] [name] [id number] DOB: [birth year] DOA: 07/07/2019 PCP: Leeroy Cha, MD   LOS: 5 days   Brief Narrative / Interim history: 73 year old female with history of obesity, OSA, DM, HTN, HLD, lymphocytic colitis, DVT in 2013, presented to the hospital with significant weakness, generalized body aches as well as a low-grade fever.  She was also complaining of a dry cough and significant rhinorrhea.  She was found to be Covid positive and a chest x-ray showed bilateral pulmonary infiltrates.  She was started on steroids as well as remdesivir and admitted to the hospital.  Subjective / 24h Interval events: No complaints today, reports she had a poor night sleep she has been awakened multiple times for vital signs and lab, denies dyspnea, cough or chest pain.  Assessment & Plan: Principal Problem:   Pneumonia due to COVID-19 virus Active Problems:   OSA (obstructive sleep apnea)   Essential hypertension   DM2 (diabetes mellitus, type 2) (HCC)   Diabetic foot ulcer (HCC)   Ischemic colitis ([place])   COVID-19   Physical deconditioning    Acute hypoxic respiratory failure due to Covid-19 Viral multifocal pneumonia -She remains on 1 L nasal cannula this morning. -She was encouraged use incentive spirometry, flutter valve, she was already out of bed to chair, she will be ambulated today with PT, she was encouraged to prone overnight if she tolerates. -Continue steroids -Treated with IV remdesivir -She is status post convalescent plasma 11/30 -Continue to monitor inflammatory markers, CRP decreasing over the last couple days -venous doppler with no evidence of DVT.  COVID-19 Labs  Recent Labs    07/10/19 0103 07/11/19 0135 07/12/19 0215  DDIMER 1.38* 0.76* 0.91*  FERRITIN 89  --   --   CRP 16.4* 8.6* 3.8*   OSA on BiPAP at home -BiPAP not to be used while Covid positive, will use oxygen/high flow  Type 2 diabetes mellitus with peripheral neuropathy  -Insulin regimen requires daily up titration, will increase Levemir to 30 twice daily and scheduled NovoLog to 8 units plus sliding scale  CBG (last 3)  Recent Labs    07/11/19 2121 07/12/19 0742 07/12/19 1152  GLUCAP 346* 219* 246*   Diabetic foot ulcer -Chronic, also has a fracture of the left metatarsal head -Followed by podiatry as an outpatient, currently on offloading boot and local dressing.  Clinically improving per patient and family, pictures reviewed in the chart done on admission  Hypertension -Blood pressure well controlled  Lymphocytic colitis -She has severe disease, she is on budesonide chronically and will continue -She is also on methotrexate at home which is now on hold -On loperamide, discontinued  due to constipation  Gram-positive bacteremia, coag negative staph -1/4 blood cultures positive for GPC's, speciated coag negative staph today.  Likely contaminant, discontinue vancomycin  Physical debility -PT/OT consult   Scheduled Meds: . acidophilus  1 capsule Oral Daily  . allopurinol  100 mg Oral BID  . aspirin EC  81 mg Oral QODAY  . budesonide  9 mg Oral Daily  . cholecalciferol  1,000 Units Oral BID  . citalopram  40 mg Oral Daily  . cyclobenzaprine  10 mg Oral QHS  . dextromethorphan-guaiFENesin  1 tablet Oral BID  . enoxaparin (LOVENOX) injection  60 mg Subcutaneous Q24H  . folic acid  1 mg Oral Daily  . lisinopril  20 mg Oral Daily   And  . hydrochlorothiazide  25 mg Oral Daily  . insulin aspart  0-15 Units Subcutaneous  TID WC  . insulin aspart  0-5 Units Subcutaneous QHS  . insulin aspart  8 Units Subcutaneous TID WC  . insulin detemir  30 Units Subcutaneous BID  . Melatonin  5 mg Oral QHS  . methylPREDNISolone (SOLU-MEDROL) injection  60 mg Intravenous Q12H  . multivitamin with minerals  1 tablet Oral Daily  . mupirocin cream   Topical Daily  . pantoprazole  40 mg Oral Daily  . potassium & sodium phosphates  1 packet Oral TID WC & HS   . potassium chloride  10 mEq Oral Daily  . potassium chloride  30 mEq Oral Q6H  . vitamin B-12  1,000 mcg Oral Daily   Continuous Infusions: . remdesivir 100 mg in NS 250 mL 100 mg (07/12/19 1100)   PRN Meds:.acetaminophen  DVT prophylaxis: Lovenox Code Status: Full code Family Communication: Will update daughter [place] [phone] Disposition Plan: TBD  Consultants:  None  Procedures:  None  Microbiology: SARS-CoV-2 +11/27 Blood cultures 11/27-1/4 bottles GPC, coag negative staph  Antimicrobials: Remdesivir 11/29 >> will finish on 12/3 Vancomycin 11/29 >> 12/1  Objective: Vitals:   07/12/19 0449 07/12/19 0500 07/12/19 0533 07/12/19 0744  BP: (!) 141/70   (!) 162/91  Pulse: 73  69 83  Resp: 18   (!) 22  Temp: 98.1 F (36.7 C)   98 F (36.7 C)  TempSrc: Oral   Oral  SpO2: 90%  90% 93%  Weight:  122.7 kg    Height:        Intake/Output Summary (Last 24 hours) at 07/12/2019 1519 Last data filed at 07/12/2019 0544 Gross per 24 hour  Intake 240 ml  Output 3500 ml  Net -3260 ml   Filed Weights   07/10/19 0451 07/11/19 0500 07/12/19 0500  Weight: 123.5 kg 124 kg 122.7 kg    Examination:  Awake Alert, Oriented X 3, No new F.N deficits, Normal affect Symmetrical Chest wall movement, Good air movement bilaterally, CTAB RRR,No Gallops,Rubs or new Murmurs, No Parasternal Heave +ve B.Sounds, Abd Soft, No tenderness, No rebound - guarding or rigidity. No Cyanosis, Clubbing ,+1 edema, No new Rash or bruise      Data Reviewed: I have independently reviewed following labs and imaging studies   CBC: Recent Labs  Lab 07/07/19 1410 07/08/19 0414 07/09/19 0933 07/10/19 0103 07/11/19 0135 07/12/19 0215  WBC 6.9 7.0 6.0 4.6 7.3 8.8  NEUTROABS 5.2  --  4.2 3.4  --   --   HGB 12.3 12.0 12.0 10.9* 10.6* 11.2*  HCT 39.1 38.7 37.6 33.0* 31.9* 33.9*  MCV 92.0 93.0 90.8 88.9 87.9 87.1  PLT 185 152 151 169 191 [id number]   Basic Metabolic Panel: Recent Labs   Lab 07/08/19 0414 07/09/19 0419 07/10/19 0103 07/11/19 0135 07/12/19 0215  NA 130* 130* 132* 132* 135  K 3.4* 3.7 3.5 3.5 3.0*  CL 94* 95* 97* 96* 93*  CO2 25 26 25 23 26   GLUCOSE 210* 203* 302* 279* 271*  BUN 10 11 15 19 23   CREATININE 0.64 0.74 0.82 0.71 0.71  CALCIUM 8.1* 7.9* 8.1* 8.8* 8.8*  MG  --  1.7 1.6* 1.9  --   PHOS  --  1.9*  --   --   --    GFR: Estimated Creatinine Clearance: 83.7 mL/min (by C-G formula based on SCr of 0.71 mg/dL). Liver Function Tests: Recent Labs  Lab 07/07/19 1410 07/08/19 0414 07/10/19 0103 07/11/19 0135 07/12/19 0215  AST 36 28 29 32 36  ALT 41 32 29 32 35  ALKPHOS 62 52 43 47 51  BILITOT 0.6 0.6 0.7 0.4 0.4  PROT 6.4* 5.9* 5.5* 5.5* 5.6*  ALBUMIN 3.1* 2.9* 2.4* 2.3* 2.4*   No results for input(s): LIPASE, AMYLASE in the last 168 hours. No results for input(s): AMMONIA in the last 168 hours. Coagulation Profile: No results for input(s): INR, PROTIME in the last 168 hours. Cardiac Enzymes: No results for input(s): CKTOTAL, CKMB, CKMBINDEX, TROPONINI in the last 168 hours. BNP (last 3 results) No results for input(s): PROBNP in the last 8760 hours. HbA1C: No results for input(s): HGBA1C in the last 72 hours. CBG: Recent Labs  Lab 07/11/19 1129 07/11/19 1602 07/11/19 2121 07/12/19 0742 07/12/19 1152  GLUCAP 309* 355* 346* 219* 246*   Lipid Profile: No results for input(s): CHOL, HDL, LDLCALC, TRIG, CHOLHDL, LDLDIRECT in the last 72 hours. Thyroid Function Tests: No results for input(s): TSH, T4TOTAL, FREET4, T3FREE, THYROIDAB in the last 72 hours. Anemia Panel: Recent Labs    07/10/19 0103  FERRITIN 89   Urine analysis:    Component Value Date/Time   COLORURINE YELLOW 07/07/2019 1850   APPEARANCEUR HAZY (A) 07/07/2019 1850   LABSPEC 1.019 07/07/2019 1850   PHURINE 6.0 07/07/2019 1850   GLUCOSEU 50 (A) 07/07/2019 1850   HGBUR NEGATIVE 07/07/2019 1850   BILIRUBINUR NEGATIVE 07/07/2019 1850   KETONESUR NEGATIVE  07/07/2019 1850   PROTEINUR NEGATIVE 07/07/2019 1850   UROBILINOGEN 1.0 09/06/2012 1046   NITRITE POSITIVE (A) 07/07/2019 1850   LEUKOCYTESUR NEGATIVE 07/07/2019 1850   Sepsis Labs: Invalid input(s): PROCALCITONIN, LACTICIDVEN  Recent Results (from the past 240 hour(s))  Blood Culture (routine x 2)     Status: None   Collection Time: 07/07/19  2:10 PM   Specimen: BLOOD  Result Value Ref Range Status   Specimen Description   Final    BLOOD LEFT ANTECUBITAL Performed at [facility] [address] [place], [place] [address]    Special Requests   Final    BOTTLES DRAWN AEROBIC AND ANAEROBIC Blood Culture adequate volume Performed at [facility] [address] [place], [place] [address]    Culture   Final    NO GROWTH 5 DAYS Performed at [facility], [place] [address], [place], [place] [address]    Report Status 07/12/2019 FINAL  Final  Blood Culture (routine x 2)     Status: Abnormal   Collection Time: 07/07/19  2:11 PM   Specimen: BLOOD  Result Value Ref Range Status   Specimen Description   Final    BLOOD LEFT ANTECUBITAL Performed at [facility] [address] [place], [place] [address]    Special Requests   Final    BOTTLES DRAWN AEROBIC AND ANAEROBIC Blood Culture adequate volume Performed at [facility] [address] [place], [place] [address]    Culture  Setup Time   Final    GRAM POSITIVE COCCI CRITICAL RESULT CALLED TO, READ BACK BY AND VERIFIED WITH: PHARM D BETH GREEN AT 0305 BY [place] ON 07/09/2019    Culture (A)  Final    STAPHYLOCOCCUS SPECIES (COAGULASE NEGATIVE) THE SIGNIFICANCE OF ISOLATING THIS ORGANISM FROM A SINGLE SET OF BLOOD CULTURES WHEN MULTIPLE SETS ARE DRAWN IS UNCERTAIN. PLEASE NOTIFY THE MICROBIOLOGY DEPARTMENT WITHIN ONE WEEK IF SPECIATION AND SENSITIVITIES ARE REQUIRED. Performed at [facility], [place] [address] [place],   [address]    Report Status  07/11/2019 FINAL  Final      Radiology Studies: Dg Chest Port 1 View  Result Date: 07/11/2019 CLINICAL DATA:  Hypoxemia. EXAM: PORTABLE CHEST 1 VIEW COMPARISON:  07/09/2019 and earlier exams. FINDINGS: Cardiac silhouette is top-normal in size. No mediastinal or hilar masses or evidence of adenopathy. For Left lung base opacity is similar to the previous exam. Previously seen right lung base opacity has improved. No new lung abnormalities. No visualized pleural effusion. No pneumothorax. Skeletal structures are grossly intact. IMPRESSION: 1. Left lung base opacity, which is similar to the most recent prior exam. This is consistent with pneumonia or atelectasis. Previously noted right base opacity has improved consistent with improved/resolved atelectasis. Electronically Signed   By: Lajean Manes M.D.   On: 07/11/2019 13:01   Vas [place] Lower Extremity Venous (dvt)  Result Date: 07/12/2019  Lower Venous Study Indications: Swelling.  Risk Factors: COVID 19 positive. Limitations: Body habitus and poor ultrasound/tissue interface. Comparison Study: No prior studies. Performing Technologist: Oliver Hum RVT  Examination Guidelines: A complete evaluation includes B-mode imaging, spectral Doppler, color Doppler, and power Doppler as needed of all accessible portions of each vessel. Bilateral testing is considered an integral part of a complete examination. Limited examinations for reoccurring indications may be performed as noted.  +-----+---------------+---------+-----------+----------+--------------+ RIGHTCompressibilityPhasicitySpontaneityPropertiesThrombus Aging +-----+---------------+---------+-----------+----------+--------------+ CFV  Full           Yes      Yes                                 +-----+---------------+---------+-----------+----------+--------------+   +---------+---------------+---------+-----------+----------+--------------+ LEFT      CompressibilityPhasicitySpontaneityPropertiesThrombus Aging +---------+---------------+---------+-----------+----------+--------------+ CFV      Full           Yes      Yes                                 +---------+---------------+---------+-----------+----------+--------------+ SFJ      Full                                                        +---------+---------------+---------+-----------+----------+--------------+ FV Prox  Full                                                        +---------+---------------+---------+-----------+----------+--------------+ FV Mid   Full                                                        +---------+---------------+---------+-----------+----------+--------------+ FV Distal               Yes      Yes                                 +---------+---------------+---------+-----------+----------+--------------+ PFV      Full                                                        +---------+---------------+---------+-----------+----------+--------------+  POP      Full           Yes      Yes                                 +---------+---------------+---------+-----------+----------+--------------+ PTV      Full                                                        +---------+---------------+---------+-----------+----------+--------------+ PERO     Full                                                        +---------+---------------+---------+-----------+----------+--------------+     Summary: Right: No evidence of common femoral vein obstruction. Left: There is no evidence of deep vein thrombosis in the lower extremity. However, portions of this examination were limited- see technologist comments above. No cystic structure found in the popliteal fossa.  *See table(s) above for measurements and observations. Electronically signed by Harold Barban MD on 07/12/2019 at 2:42:03 PM.    Final     Phillips Climes MD  [facility]  Contact via  [email or web address]  [place] P: [phone] F: [phone]

## 2019-07-12 NOTE — Progress Notes (Signed)
Pt had a BLLE US done today. K was 3.0 so that was supplemented (yesterday she was given lasix) Pt appetite was good today. BG still elevated d/t steroids even after insulin dose increase. Pt ambulated w/ PT today-see note for details. RA most of day. Up in recliner for half of shift. Hourly safety checks/rounds complete. No c/o pain.

## 2019-07-12 NOTE — Progress Notes (Signed)
Left lower extremity venous duplex has been completed. Preliminary results can be found in CV Proc through chart review.   07/12/19 12:32 PM [name] [name] RVT

## 2019-07-12 NOTE — Progress Notes (Signed)
Physical Therapy Treatment Patient Details Name: [name] [name] MRN: [id number] DOB: [birth year] Today's Date: 07/12/2019    History of Present Illness 73 year old female was admitted for extreme weakness, + COVID.  PMH:  morbid obesity, HTN, DM, peripheral neuropathy, L foot wound--wearing boot. Negative DVT LLE    PT Comments    The patient  Ambulated ion RA with probe on left ear, SPO2 lowest 90%. Patient resting on RA at 95%. RN aware patient on RA. Continue PT   Follow Up Recommendations  No PT follow up     Equipment Recommendations  None recommended by PT    Recommendations for Other Services       Precautions / Restrictions Precautions Precautions: Fall Precaution Comments: monitor on RA Other Brace: cam boot, patient states that she wears sneakers not CAM , if not walking too far. patient requested sneakers this visit.    Mobility  Bed Mobility      General bed mobility comments: OOB in chair  Transfers Overall transfer level: Needs assistance Equipment used: Rolling walker (2 wheeled) Transfers: Sit to/from Stand Sit to Stand: Supervision         General transfer comment: uses UE's to push up and reach back  Ambulation/Gait Ambulation/Gait assistance: Min guard Gait Distance (Feet): 60 Feet Assistive device: Rolling walker (2 wheeled) Gait Pattern/deviations: Step-to pattern;Step-through pattern;Decreased step length - right;Decreased step length - left;Shuffle;Trunk flexed Gait velocity: decr   General Gait Details: slow pace, turns well with RW, on  RA with SPO2 90-92%   Stairs             Wheelchair Mobility    Modified Rankin (Stroke Patients Only)       Balance Overall balance assessment: Needs assistance   Sitting balance-Leahy Scale: Good     Standing balance support: Bilateral upper extremity supported Standing balance-Leahy Scale: Fair                              Cognition Arousal/Alertness:  Awake/alert                                            Exercises      General Comments        Pertinent Vitals/Pain Pain Assessment: No/denies pain    Home Living                      Prior Function            PT Goals (current goals can now be found in the care plan section) Progress towards PT goals: Progressing toward goals    Frequency    Min 3X/week      PT Plan Current plan remains appropriate    Co-evaluation              AM-PAC PT "6 Clicks" Mobility   Outcome Measure  Help needed turning from your back to your side while in a flat bed without using bedrails?: None Help needed moving from lying on your back to sitting on the side of a flat bed without using bedrails?: None Help needed moving to and from a bed to a chair (including a wheelchair)?: None Help needed standing up from a chair using your arms (e.g., wheelchair or bedside chair)?: A Little Help needed to walk in  hospital room?: A Little Help needed climbing 3-5 steps with a railing? : A Little 6 Click Score: 21    End of Session Equipment Utilized During Treatment: Oxygen Activity Tolerance: Patient tolerated treatment well;Patient limited by fatigue Patient left: in chair;with call bell/phone within reach Nurse Communication: Mobility status PT Visit Diagnosis: Difficulty in walking, not elsewhere classified (R26.2);Muscle weakness (generalized) (M62.81)     Time: [id number] PT Time Calculation (min) (ACUTE ONLY): 35 min  Charges:  $Gait Training: 23-37 mins                     [place] Pager [phone] Office [phone]    Claretha Cooper 07/12/2019, 5:02 PM

## 2019-07-13 LAB — COMPREHENSIVE METABOLIC PANEL
ALT: 59 U/L — ABNORMAL HIGH (ref 0–44)
AST: 59 U/L — ABNORMAL HIGH (ref 15–41)
Albumin: 2.3 g/dL — ABNORMAL LOW (ref 3.5–5.0)
Alkaline Phosphatase: 51 U/L (ref 38–126)
Anion gap: 12 (ref 5–15)
BUN: 26 mg/dL — ABNORMAL HIGH (ref 8–23)
CO2: 24 mmol/L (ref 22–32)
Calcium: 8.8 mg/dL — ABNORMAL LOW (ref 8.9–10.3)
Chloride: 98 mmol/L (ref 98–111)
Creatinine, Ser: 0.71 mg/dL (ref 0.44–1.00)
GFR calc Af Amer: >60 mL/min (ref >60)
GFR calc non Af Amer: >60 mL/min (ref >60)
Glucose, Bld: 230 mg/dL — ABNORMAL HIGH (ref 70–99)
Potassium: 4.1 mmol/L (ref 3.5–5.1)
Sodium: 134 mmol/L — ABNORMAL LOW (ref 135–145)
Total Bilirubin: 0.5 mg/dL (ref 0.3–1.2)
Total Protein: 5.5 g/dL — ABNORMAL LOW (ref 6.5–8.1)

## 2019-07-13 LAB — C-REACTIVE PROTEIN: CRP: 1.6 mg/dL — ABNORMAL HIGH (ref <1.0)

## 2019-07-13 LAB — D-DIMER, QUANTITATIVE: D-Dimer, Quant: 0.93 ug/mL-FEU — ABNORMAL HIGH (ref 0.00–0.50)

## 2019-07-13 LAB — CBC
HCT: 34.2 % — ABNORMAL LOW (ref 36.0–46.0)
Hemoglobin: 11.2 g/dL — ABNORMAL LOW (ref 12.0–15.0)
MCH: 28.7 pg (ref 26.0–34.0)
MCHC: 32.7 g/dL (ref 30.0–36.0)
MCV: 87.7 fL (ref 80.0–100.0)
Platelets: 238 10*3/uL (ref 150–400)
RBC: 3.9 MIL/uL (ref 3.87–5.11)
RDW: 16.3 % — ABNORMAL HIGH (ref 11.5–15.5)
WBC: 9.9 10*3/uL (ref 4.0–10.5)
nRBC: 0.2 % (ref 0.0–0.2)

## 2019-07-13 LAB — GLUCOSE, CAPILLARY
Glucose-Capillary: 328 mg/dL — ABNORMAL HIGH (ref 70–99)
Glucose-Capillary: 191 mg/dL — ABNORMAL HIGH (ref 70–99)
Glucose-Capillary: 224 mg/dL — ABNORMAL HIGH (ref 70–99)

## 2019-07-13 MED ORDER — METHOTREXATE 2.5 MG PO TABS: 20.0000 mg | ORAL_TABLET | ORAL | 0 refills | Status: DC

## 2019-07-13 MED ORDER — ACETAMINOPHEN 325 MG PO TABS: 650.0000 mg | ORAL_TABLET | Freq: Four times a day (QID) | ORAL | Status: DC | PRN

## 2019-07-13 MED ORDER — PREDNISONE 10 MG (21) PO TBPK: ORAL_TABLET | ORAL | 0 refills | Status: DC

## 2019-07-13 MED ORDER — FOLIC ACID 1 MG PO TABS: 1.0000 mg | ORAL_TABLET | Freq: Every day | ORAL | 3 refills | Status: DC

## 2019-07-13 NOTE — Discharge Instructions (Signed)
Person Under Monitoring Name: [name] [name]  Location: [place] [address]   Infection Prevention Recommendations for Individuals Confirmed to have, or Being Evaluated for, 2019 Novel Coronavirus (COVID-19) Infection Who Receive Care at Home  Individuals who are confirmed to have, or are being evaluated for, COVID-19 should follow the prevention steps below until a healthcare provider or local or state health department says they can return to normal activities.  Stay home except to get medical care You should restrict activities outside your home, except for getting medical care. Do not go to work, school, or public areas, and do not use public transportation or taxis.  Call ahead before visiting your doctor Before your medical appointment, call the healthcare provider and tell them that you have, or are being evaluated for, COVID-19 infection. This will help the healthcare providers office take steps to keep other people from getting infected. Ask your healthcare provider to call the local or state health department.  Monitor your symptoms Seek prompt medical attention if your illness is worsening (e.g., difficulty breathing). Before going to your medical appointment, call the healthcare provider and tell them that you have, or are being evaluated for, COVID-19 infection. Ask your healthcare provider to call the local or state health department.  Wear a facemask You should wear a facemask that covers your nose and mouth when you are in the same room with other people and when you visit a healthcare provider. People who live with or visit you should also wear a facemask while they are in the same room with you.  Separate yourself from other people in your home As much as possible, you should stay in a different room from other people in your home. Also, you should use a separate bathroom, if available.  Avoid sharing household items You should not share  dishes, drinking glasses, cups, eating utensils, towels, bedding, or other items with other people in your home. After using these items, you should wash them thoroughly with soap and water.  Cover your coughs and sneezes Cover your mouth and nose with a tissue when you cough or sneeze, or you can cough or sneeze into your sleeve. Throw used tissues in a lined trash can, and immediately wash your hands with soap and water for at least 20 seconds or use an alcohol-based hand rub.  Wash your [facility] your hands often and thoroughly with soap and water for at least 20 seconds. You can use an alcohol-based hand sanitizer if soap and water are not available and if your hands are not visibly dirty. Avoid touching your eyes, nose, and mouth with unwashed hands.   Prevention Steps for Caregivers and Household Members of Individuals Confirmed to have, or Being Evaluated for, COVID-19 Infection Being Cared for in the Home  If you live with, or provide care at home for, a person confirmed to have, or being evaluated for, COVID-19 infection please follow these guidelines to prevent infection:  Follow healthcare providers instructions Make sure that you understand and can help the patient follow any healthcare provider instructions for all care.  Provide for the patients basic needs You should help the patient with basic needs in the home and provide support for getting groceries, prescriptions, and other personal needs.  Monitor the patients symptoms If they are getting sicker, call his or her medical provider and tell them that the patient has, or is being evaluated for, COVID-19 infection. This will help the healthcare  providers office take steps to keep other people from getting infected. Ask the healthcare provider to call the local or state health department.  Limit the number of people who have contact with the patient  If possible, have only one caregiver for the patient.  Other  household members should stay in another home or place of residence. If this is not possible, they should stay  in another room, or be separated from the patient as much as possible. Use a separate bathroom, if available.  Restrict visitors who do not have an essential need to be in the home.  Keep older adults, very young children, and other sick people away from the patient Keep older adults, very young children, and those who have compromised immune systems or chronic health conditions away from the patient. This includes people with chronic heart, lung, or kidney conditions, diabetes, and cancer.  Ensure good ventilation Make sure that shared spaces in the home have good air flow, such as from an air conditioner or an opened window, weather permitting.  Wash your hands often  Wash your hands often and thoroughly with soap and water for at least 20 seconds. You can use an alcohol based hand sanitizer if soap and water are not available and if your hands are not visibly dirty.  Avoid touching your eyes, nose, and mouth with unwashed hands.  Use disposable paper towels to dry your hands. If not available, use dedicated cloth towels and replace them when they become wet.  Wear a facemask and gloves  Wear a disposable facemask at all times in the room and gloves when you touch or have contact with the patients blood, body fluids, and/or secretions or excretions, such as sweat, saliva, sputum, nasal mucus, vomit, urine, or feces.  Ensure the mask fits over your nose and mouth tightly, and do not touch it during use.  Throw out disposable facemasks and gloves after using them. Do not reuse.  Wash your hands immediately after removing your facemask and gloves.  If your personal clothing becomes contaminated, carefully remove clothing and launder. Wash your hands after handling contaminated clothing.  Place all used disposable facemasks, gloves, and other waste in a lined container before  disposing them with other household waste.  Remove gloves and wash your hands immediately after handling these items.  Do not share dishes, glasses, or other household items with the patient  Avoid sharing household items. You should not share dishes, drinking glasses, cups, eating utensils, towels, bedding, or other items with a patient who is confirmed to have, or being evaluated for, COVID-19 infection.  After the person uses these items, you should wash them thoroughly with soap and water.  Wash laundry thoroughly  Immediately remove and wash clothes or bedding that have blood, body fluids, and/or secretions or excretions, such as sweat, saliva, sputum, nasal mucus, vomit, urine, or feces, on them.  Wear gloves when handling laundry from the patient.  Read and follow directions on labels of laundry or clothing items and detergent. In general, wash and dry with the warmest temperatures recommended on the label.  Clean all areas the individual has used often  Clean all touchable surfaces, such as counters, tabletops, doorknobs, bathroom fixtures, toilets, phones, keyboards, tablets, and bedside tables, every day. Also, clean any surfaces that may have blood, body fluids, and/or secretions or excretions on them.  Wear gloves when cleaning surfaces the patient has come in contact with.  Use a diluted bleach solution (e.g., dilute bleach with  1 part bleach and 10 parts water) or a household disinfectant with a label that says EPA-registered for coronaviruses. To make a bleach solution at home, add 1 tablespoon of bleach to 1 quart (4 cups) of water. For a larger supply, add  cup of bleach to 1 gallon (16 cups) of water.  Read labels of cleaning products and follow recommendations provided on product labels. Labels contain instructions for safe and effective use of the cleaning product including precautions you should take when applying the product, such as wearing gloves or eye protection  and making sure you have good ventilation during use of the product.  Remove gloves and wash hands immediately after cleaning.  Monitor yourself for signs and symptoms of illness Caregivers and household members are considered close contacts, should monitor their health, and will be asked to limit movement outside of the home to the extent possible. Follow the monitoring steps for close contacts listed on the symptom monitoring form.   ? If you have additional questions, contact your local health department or call the epidemiologist on call at [phone] (available 24/7). ? This guidance is subject to change. For the most up-to-date guidance from [facility], please refer to their website: [email or web address]

## 2019-07-13 NOTE — Discharge Summary (Signed)
[name] [name], is a 73 y.o. female  DOB July 10, 1946  MRN [id number].  Admission date:  07/07/2019  Admitting Physician  Nancy Marus, MD  Discharge Date:  07/13/2019   Primary MD  Lorenda Ishihara, MD  Recommendations for primary care physician for things to follow:  -Please check CBC, CMP during next visit   Admission Diagnosis  Hypokalemia [E87.6] Fatigue, unspecified type [R53.83] COVID-19 virus infection [U07.1]   Discharge Diagnosis  Hypokalemia [E87.6] Fatigue, unspecified type [R53.83] COVID-19 virus infection [U07.1]    Principal Problem:   Pneumonia due to COVID-19 virus Active Problems:   OSA (obstructive sleep apnea)   Essential hypertension   DM2 (diabetes mellitus, type 2) (HCC)   Diabetic foot ulcer (HCC)   Ischemic colitis (HCC)   COVID-19   Physical deconditioning      Past Medical History:  Diagnosis Date  . Arthritis   . Deep vein thrombosis (HCC)    right calf - 05/2012   . Diabetes mellitus without complication (HCC)    diet controlled   . GERD (gastroesophageal reflux disease)   . Hyperlipidemia   . Hypertension    Ejection fraction =>55% Left ventricular systolic function is normal. Left ventricular wall motion is normal    . Lymphocytic colitis   . Neuropathy    diabetic - in bilateral feet   . Pityriasis lichenoides chronica   . Sleep apnea    bipap    Past Surgical History:  Procedure Laterality Date  . DILATION AND CURETTAGE OF UTERUS    . HAMMER TOE SURGERY    . right hand surgery      due to blood infection   . torn meniscus repair      right knee   . TOTAL HIP ARTHROPLASTY  09/13/2012   Procedure: TOTAL HIP ARTHROPLASTY ANTERIOR APPROACH;  Surgeon: Shelda Pal, MD;  Location: [facility];  Service: Orthopedics;  Laterality: Right;       History of present illness and  Hospital Course:     Kindly see H&P for history of present illness  and admission details, please review complete Labs, Consult reports and Test reports for all details in brief  HPI  from the history and physical done on the day of admission 07/07/2019   HPI: [name] [name] is a 73 y.o. female with medical history significant of sleep apnea morbid obesity diabetes hypertension hyperlipidemia lymphocytic colitis peripheral neuropathy and DVT in 2013.  She also suffers from generalized arthritis.  She has had significant problems with her left foot.  Recently had hammertoe surgery on the left foot, which was in a cast and now she wears a boot. Very mobile at the house however she has immobility for the last 2 to 3 days she has been noted to be COVID-19 positive.  Her husband is also hospitalized at [facility] for COVID-19.  She has felt extremely weak unable to get up without assistance.  In the emergency room she was started to ambulate but she could  not get out of bed without assistance.  She complains of generalized aches and pains.  The severity is noted to be 3-4 out of 10 and it is generalized.  It is worse in the lower extremities she has had a low-grade fever.  She has a dry cough with significant rhinorrhea.  He has complained of headaches  lED Course: She has had work-up done.  Her labs were found to be fairly nondescript however given her weakness and inability to get around hospital service has been requested to admit her  Hospital Course     Acute hypoxic respiratory failure due to Covid-19 Viral multifocal pneumonia -She is with initial oxygen requirement, this has significantly improved, she is on room air for last 24 hours, ambulated in the hallway yesterday and today with no hypoxia on room air. -She was encouraged to take her incentive spirometry and flutter valve at home and keep using them. -Treated with IV steroids, to be discharged on p.o. prednisone taper. -Treated with IV remdesivir -She is status post convalescent plasma  11/30 -Inflammatory markers trending down -Venous Doppler left lower extremity was obtained given some swelling, no evidence of DVT  COVID-19 Labs  Recent Labs (last 2 labs)        Recent Labs    07/10/19 0103 07/11/19 0135 07/12/19 0215  DDIMER 1.38* 0.76* 0.91*  FERRITIN 89  --   --   CRP 16.4* 8.6* 3.8*     OSA on BiPAP at home -BiPAP not to be used while Covid positive  Type 2 diabetes mellitus with peripheral neuropathy -Resume home regimen on discharge  CBG (last 3)  Recent Labs (last 2 labs)        Recent Labs    07/11/19 2121 07/12/19 0742 07/12/19 1152  GLUCAP 346* 219* 246*     Diabetic foot ulcer -Chronic, also has a fracture of the left metatarsal head -Followed by podiatry as an outpatient, currently on offloading boot and local dressing.    Hypertension -Blood pressure well controlled  Lymphocytic colitis -She has severe disease, she is on budesonide chronically and will continue -She is also on methotrexate at home, to hold for the next 2 weeks.  Gram-positive bacteremia, coag negative staph -1/4 blood cultures positive for GPC's, speciated coag negative staph   Likely contaminant, vancomycin has been stopped     Discharge Condition:  stable   Follow UP  Follow-up Information    Pincus Sanes, MD Follow up in 2 week(s).   Specialty: Internal Medicine Why: Inform them you were COVID-19 were positive on 11/27, and in 2 weeks follow-up you will be out of 21 days quarantine.Benay Pillow information: [address] [place] [address] [phone]             Discharge Instructions  and  Discharge Medications     Discharge Instructions    Discharge instructions   Complete by: As directed    Follow with Primary MD Lorenda Ishihara, MD in 14 days   Get CBC, CMP,  checked  by Primary MD next visit.    Activity: As tolerated with Full fall precautions use walker/cane & assistance as needed   Disposition Home     Diet: Heart Healthy /carb modified .  On your next visit with your primary care physician please Get Medicines reviewed and adjusted.   Please request your Prim.MD to go over all Hospital Tests and Procedure/Radiological results at the follow up, please get all Hospital records sent to your  Prim MD by signing hospital release before you go home.   If you experience worsening of your admission symptoms, develop shortness of breath, life threatening emergency, suicidal or homicidal thoughts you must seek medical attention immediately by calling 911 or calling your MD immediately  if symptoms less severe.  You Must read complete instructions/literature along with all the possible adverse reactions/side effects for all the Medicines you take and that have been prescribed to you. Take any new Medicines after you have completely understood and accpet all the possible adverse reactions/side effects.   Do not drive, operating heavy machinery, perform activities at heights, swimming or participation in water activities or provide baby sitting services if your were admitted for syncope or siezures until you have seen by Primary MD or a Neurologist and advised to do so again.  Do not drive when taking Pain medications.    Do not take more than prescribed Pain, Sleep and Anxiety Medications  Special Instructions: If you have smoked or chewed Tobacco  in the last 2 yrs please stop smoking, stop any regular Alcohol  and or any Recreational drug use.  Wear Seat belts while driving.   Please note  You were cared for by a hospitalist during your hospital stay. If you have any questions about your discharge medications or the care you received while you were in the hospital after you are discharged, you can call the unit and asked to speak with the hospitalist on call if the hospitalist that took care of you is not available. Once you are discharged, your primary care physician will handle any further  medical issues. Please note that NO REFILLS for any discharge medications will be authorized once you are discharged, as it is imperative that you return to your primary care physician (or establish a relationship with a primary care physician if you do not have one) for your aftercare needs so that they can reassess your need for medications and monitor your lab values.   Increase activity slowly   Complete by: As directed      Allergies as of 07/13/2019      Reactions   Other Shortness Of Breath   Blue fish: palms and feet turn red   Bee Venom Swelling   Sulfa Antibiotics Hives      Medication List    TAKE these medications   acetaminophen 325 MG tablet Commonly known as: TYLENOL Take 2 tablets (650 mg total) by mouth every 6 (six) hours as needed for mild pain or headache.   ALIGN PO Take 1 capsule by mouth daily.   allopurinol 100 MG tablet Commonly known as: ZYLOPRIM Take 100 mg by mouth 2 (two) times daily.   aspirin 81 MG tablet Take 81 mg by mouth every other day.   CIMZIA [place] Inject 400 mg into the skin every 30 (thirty) days. 200 mg on each side of stomach   citalopram 40 MG tablet Commonly known as: CELEXA Take 40 mg by mouth daily.   cyclobenzaprine 10 MG tablet Commonly known as: FLEXERIL Take 10 mg by mouth daily.   folic acid 1 MG tablet Commonly known as: FOLVITE Take 1 tablet (1 mg total) by mouth daily. Start taking on: July 14, 2019   Levemir FlexTouch 100 UNIT/ML Pen Generic drug: Insulin Detemir Inject 14 Units into the skin 2 (two) times daily.   lisinopril-hydrochlorothiazide 20-25 MG tablet Commonly known as: ZESTORETIC Take 1 tablet by mouth daily.   loperamide 2 MG capsule Commonly known  as: IMODIUM Take 6 mg by mouth 2 (two) times daily.   Melatonin 5 MG Subl Place 5 mg under the tongue at bedtime.   metFORMIN 500 MG 24 hr tablet Commonly known as: GLUCOPHAGE-XR Take 500 mg by mouth 2 (two) times daily.   methotrexate 2.5 MG  tablet Commonly known as: RHEUMATREX Take 8 tablets (20 mg total) by mouth once a week. Caution: Chemotherapy. Protect from light. Takes on Friday - hold for 2 weeks. What changed: additional instructions   mupirocin cream 2 % Commonly known as: BACTROBAN Apply 1 application topically 2 (two) times daily as needed (psoriasis).   omeprazole 40 MG capsule Commonly known as: PRILOSEC Take 40 mg by mouth at bedtime.   potassium chloride 10 MEQ tablet Commonly known as: KLOR-CON Take 10 mEq by mouth daily.   predniSONE 10 MG (21) Tbpk tablet Commonly known as: STERAPRED UNI-PAK 21 TAB Use per package instruction   Uceris 9 MG Tb24 Generic drug: Budesonide Take 9 mg by mouth daily.   Vitamin B-12 1000 MCG Subl Place 1,000 mcg under the tongue daily.   Vitamin D3 25 MCG (1000 UT) Caps Take 1,000 Units by mouth 2 (two) times daily.         Diet and Activity recommendation: See Discharge Instructions above   Consults obtained -  None   Major procedures and Radiology Reports - PLEASE review detailed and final reports for all details, in brief -      Dg Chest Port 1 View  Result Date: 07/11/2019 CLINICAL DATA:  Hypoxemia. EXAM: PORTABLE CHEST 1 VIEW COMPARISON:  07/09/2019 and earlier exams. FINDINGS: Cardiac silhouette is top-normal in size. No mediastinal or hilar masses or evidence of adenopathy. For Left lung base opacity is similar to the previous exam. Previously seen right lung base opacity has improved. No new lung abnormalities. No visualized pleural effusion. No pneumothorax. Skeletal structures are grossly intact. IMPRESSION: 1. Left lung base opacity, which is similar to the most recent prior exam. This is consistent with pneumonia or atelectasis. Previously noted right base opacity has improved consistent with improved/resolved atelectasis. Electronically Signed   By: Amie Portland M.D.   On: 07/11/2019 13:01   Dg Chest Port 1 View  Result Date:  07/09/2019 CLINICAL DATA:  COVID-19 positive patient. EXAM: PORTABLE CHEST 1 VIEW COMPARISON:  July 07, 2019 FINDINGS: Bibasilar pulmonary infiltrates are worsened in the interval. The heart, hila, mediastinum, lungs, and pleura are otherwise unremarkable. IMPRESSION: There are bilateral pulmonary infiltrates which have worsened in the interval. Recommend attention on follow-up. Electronically Signed   By: Gerome Sam III M.D   On: 07/09/2019 11:04   Dg Chest Port 1 View  Result Date: 07/07/2019 CLINICAL DATA:  Shortness of breath. COVID-19. Weakness and fatigue and chest congestion. EXAM: PORTABLE CHEST 1 VIEW COMPARISON:  08/22/2015 FINDINGS: The heart size and mediastinal contours are within normal limits. Both lungs are clear. Severe chronic arthritic changes of both glenohumeral joints. No acute bone abnormality. IMPRESSION: No active disease in the chest. Electronically Signed   By: Francene Boyers M.D.   On: 07/07/2019 14:39   Dg Foot Complete Left  Result Date: 07/09/2019 CLINICAL DATA:  Left foot pain.  No known injury. EXAM: LEFT FOOT - COMPLETE 3+ VIEW COMPARISON:  None. FINDINGS: A hallux valgus deformity is identified. Soft tissue swelling is seen over the dorsum of the foot. There is a fracture through the distal third metatarsal. Scattered degenerative changes. No other fractures. No other acute abnormalities.  IMPRESSION: There is a fracture through the distal third metatarsal with overlying soft tissue swelling. Electronically Signed   By: Gerome Sam III M.D   On: 07/09/2019 11:10   Vas [place] Lower Extremity Venous (dvt)  Result Date: 07/12/2019  Lower Venous Study Indications: Swelling.  Risk Factors: COVID 19 positive. Limitations: Body habitus and poor ultrasound/tissue interface. Comparison Study: No prior studies. Performing Technologist: Chanda Busing RVT  Examination Guidelines: A complete evaluation includes B-mode imaging, spectral Doppler, color Doppler, and power  Doppler as needed of all accessible portions of each vessel. Bilateral testing is considered an integral part of a complete examination. Limited examinations for reoccurring indications may be performed as noted.  +-----+---------------+---------+-----------+----------+--------------+ RIGHTCompressibilityPhasicitySpontaneityPropertiesThrombus Aging +-----+---------------+---------+-----------+----------+--------------+ CFV  Full           Yes      Yes                                 +-----+---------------+---------+-----------+----------+--------------+   +---------+---------------+---------+-----------+----------+--------------+ LEFT     CompressibilityPhasicitySpontaneityPropertiesThrombus Aging +---------+---------------+---------+-----------+----------+--------------+ CFV      Full           Yes      Yes                                 +---------+---------------+---------+-----------+----------+--------------+ SFJ      Full                                                        +---------+---------------+---------+-----------+----------+--------------+ FV Prox  Full                                                        +---------+---------------+---------+-----------+----------+--------------+ FV Mid   Full                                                        +---------+---------------+---------+-----------+----------+--------------+ FV Distal               Yes      Yes                                 +---------+---------------+---------+-----------+----------+--------------+ PFV      Full                                                        +---------+---------------+---------+-----------+----------+--------------+ POP      Full           Yes      Yes                                 +---------+---------------+---------+-----------+----------+--------------+  PTV      Full                                                         +---------+---------------+---------+-----------+----------+--------------+ PERO     Full                                                        +---------+---------------+---------+-----------+----------+--------------+     Summary: Right: No evidence of common femoral vein obstruction. Left: There is no evidence of deep vein thrombosis in the lower extremity. However, portions of this examination were limited- see technologist comments above. No cystic structure found in the popliteal fossa.  *See table(s) above for measurements and observations. Electronically signed by Coral Else MD on 07/12/2019 at 2:42:03 PM.    Final     Micro Results     Recent Results (from the past 240 hour(s))  Blood Culture (routine x 2)     Status: None   Collection Time: 07/07/19  2:10 PM   Specimen: BLOOD  Result Value Ref Range Status   Specimen Description   Final    BLOOD LEFT ANTECUBITAL Performed at [facility], [address] [place], [place] [address]    Special Requests   Final    BOTTLES DRAWN AEROBIC AND ANAEROBIC Blood Culture adequate volume Performed at [facility], [address] [place], [place] [address]    Culture   Final    NO GROWTH 5 DAYS Performed at [facility], [address] [place], [place] [address]    Report Status 07/12/2019 FINAL  Final  Blood Culture (routine x 2)     Status: Abnormal   Collection Time: 07/07/19  2:11 PM   Specimen: BLOOD  Result Value Ref Range Status   Specimen Description   Final    BLOOD LEFT ANTECUBITAL Performed at [facility], [address], [place], [place] [address]    Special Requests   Final    BOTTLES DRAWN AEROBIC AND ANAEROBIC Blood Culture adequate volume Performed at [facility], [address] [place], [place] [address]    Culture  Setup Time   Final    GRAM POSITIVE COCCI CRITICAL RESULT CALLED TO, READ BACK BY AND VERIFIED  WITH: PHARM D BETH GREEN AT 0305 BY MESSAN HOUEGNIFIO ON 07/09/2019    Culture (A)  Final    STAPHYLOCOCCUS SPECIES (COAGULASE NEGATIVE) THE SIGNIFICANCE OF ISOLATING THIS ORGANISM FROM A SINGLE SET OF BLOOD CULTURES WHEN MULTIPLE SETS ARE DRAWN IS UNCERTAIN. PLEASE NOTIFY THE MICROBIOLOGY DEPARTMENT WITHIN ONE WEEK IF SPECIATION AND SENSITIVITIES ARE REQUIRED. Performed at [facility], [address], [place], [place] [address]    Report Status 07/11/2019 FINAL  Final       Today   Subjective:   [name] [name] today has no headache,no chest or abdominal pain, she denies any dyspnea, ambulated with staff in the hallway with no hypoxia as well. Objective:   Blood pressure (!) 148/89, pulse 82, temperature 98.4 F (36.9 C), temperature source Oral, resp. rate 18, height 5\' 6"  (1.676 m), weight 122 kg, SpO2 92 %.  Intake/Output Summary (Last 24 hours) at 07/13/2019 1149 Last data filed at 07/13/2019 0854 Gross per 24 hour  Intake 480 ml  Output 850 ml  Net -370 ml    Exam Awake Alert, Oriented x 3, No new F.N deficits, Normal affect Symmetrical Chest wall movement, Good air movement bilaterally, CTAB RRR,No Gallops,Rubs or new Murmurs, No Parasternal Heave +ve B.Sounds, Abd Soft, Non tender, No rebound -guarding or rigidity. No Cyanosis, Clubbing or edema, No new Rash or bruise  Data Review   CBC w Diff:  Lab Results  Component Value Date   WBC 9.9 07/13/2019   HGB 11.2 (L) 07/13/2019   HGB 12.4 01/13/2019   HCT 34.2 (L) 07/13/2019   HCT 36.9 01/13/2019   PLT 238 07/13/2019   PLT 307 01/13/2019   LYMPHOPCT 18 07/10/2019   MONOPCT 6 07/10/2019   EOSPCT 0 07/10/2019   BASOPCT 0 07/10/2019    CMP:  Lab Results  Component Value Date   NA 134 (L) 07/13/2019   NA 136 01/13/2019   K 4.1 07/13/2019   CL 98 07/13/2019   CO2 24 07/13/2019   BUN 26 (H) 07/13/2019   BUN 20 01/13/2019   CREATININE 0.71 07/13/2019   PROT 5.5 (L) 07/13/2019   PROT 6.3 01/13/2019    ALBUMIN 2.3 (L) 07/13/2019   ALBUMIN 3.9 01/13/2019   BILITOT 0.5 07/13/2019   BILITOT 0.2 01/13/2019   ALKPHOS 51 07/13/2019   AST 59 (H) 07/13/2019   ALT 59 (H) 07/13/2019  .   Total Time in preparing paper work, data evaluation and todays exam - 35 minutes  Huey Bienenstock M.D on 07/13/2019 at 11:49 AM  [facility]   Office  [phone]

## 2019-07-13 NOTE — Progress Notes (Signed)
Ambulated with patient in hallway.  Patient started on room air with saturations at 94%.  Ambulated approximately 47' in hallway with front wheel walker.  Saturations remained 90% and above on room air. Tolerated well.  No SOB noted.

## 2019-07-14 ENCOUNTER — Telehealth: Payer: Self-pay

## 2019-07-14 NOTE — Telephone Encounter (Signed)
Called patient and informed. She is working on getting her records from her current PCP and will call back to schedule an appointment when she is feeling better.

## 2019-07-14 NOTE — Telephone Encounter (Signed)
Yes, I will accept.  She recently had COVID so needs to be asymptomatic or enough time has had to have passed.

## 2019-07-14 NOTE — Telephone Encounter (Signed)
Can you schedule a new patient appointment for when she is doing better and has no sxs since she recently had COVID. Thank you

## 2019-07-14 NOTE — Telephone Encounter (Signed)
Copied from [place] [id number]. Topic: Appointment Scheduling - Prior Auth Required for Appointment >> Jul 14, 2019  9:58 AM Robina Ade, Helene Kelp D wrote: No appointment has been scheduled. Patient is requesting new patient/hospital f/u appointment with Dr. Quay Burow. Per patient she said that her daughter is a patient with her and she told her that Dr. Quay Burow said she would see her as a new patient.  Per scheduling protocol, this appointment requires a prior authorization prior to scheduling.  Route to department's PEC pool.

## 2019-07-17 ENCOUNTER — Telehealth: Payer: Self-pay

## 2019-07-17 NOTE — Telephone Encounter (Signed)
Copied from [place] [phone]. Topic: Appointment Scheduling - Prior Auth Required for Appointment >> Jul 14, 2019  9:58 AM Robina Ade, Helene Kelp D wrote: No appointment has been scheduled. Patient is requesting new patient/hospital f/u appointment with Dr. Quay Burow. Per patient she said that her daughter is a patient with her and she told her that Dr. Quay Burow said she would see her as a new patient.  Per scheduling protocol, this appointment requires a prior authorization prior to scheduling.  Route to department's PEC pool.

## 2019-07-20 ENCOUNTER — Telehealth: Payer: Self-pay

## 2019-07-20 NOTE — Telephone Encounter (Signed)
Copied from [place] [phone]. Topic: Appointment Scheduling - Prior Auth Required for Appointment >> Jul 14, 2019  9:58 AM Robina Ade, Helene Kelp D wrote: No appointment has been scheduled. Patient is requesting new patient/hospital f/u appointment with Dr. Quay Burow. Per patient she said that her daughter is a patient with her and she told her that Dr. Quay Burow said she would see her as a new patient.  Per scheduling protocol, this appointment requires a prior authorization prior to scheduling.  Route to department's PEC pool.

## 2019-07-21 NOTE — Telephone Encounter (Signed)
Left pt vm to call back to schedule. 

## 2019-07-26 DIAGNOSIS — L4059 Other psoriatic arthropathy: Secondary | ICD-10-CM | POA: Diagnosis not present

## 2019-07-26 DIAGNOSIS — Z79899 Other long term (current) drug therapy: Secondary | ICD-10-CM | POA: Diagnosis not present

## 2019-07-31 NOTE — Patient Instructions (Signed)

## 2019-07-31 NOTE — Progress Notes (Signed)
Subjective:    Patient ID: [name] [name], female    DOB: May 19, 1946, 73 y.o.   MRN: [id number]  HPI      Medications and allergies reviewed with patient and updated if appropriate.  Patient Active Problem List   Diagnosis Date Noted  . Pneumonia due to COVID-19 virus 07/09/2019  . Physical deconditioning 07/08/2019  . COVID-19 07/07/2019  . Pain in left knee 10/13/2018  . Ischemic colitis ([place]) 10/06/2018  . Arthritis 07/21/2018  . Urinary incontinence 07/21/2018  . Diabetic foot ulcer ([place]) 07/13/2018  . Closed fracture of fifth metatarsal bone 05/13/2018  . Cellulitis of toe of left foot 05/02/2018  . DM2 (diabetes mellitus, type 2) ([place]) 05/02/2018  . Psoriasis 05/02/2018  . Pain in joint of right ankle 04/25/2018  . Osteoarthritis of knee 10/18/2017  . Chest pain 01/12/2015  . Right bundle branch block 05/20/2014  . OSA (obstructive sleep apnea) 03/12/2013  . Essential hypertension 03/12/2013  . Incomplete RBBB 03/12/2013  . Expected blood loss anemia 09/14/2012  . Obesity 09/14/2012  . Hyponatremia 09/14/2012  . S/P right THA, AA 09/13/2012  . Deep venous thrombosis of lower extremity ([place]) 08/10/2010  . 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 acetaminophen (TYLENOL) 325 MG tablet Take 2 tablets (650 mg total) by mouth every 6 (six) hours as needed for mild pain or headache.    . allopurinol (ZYLOPRIM) 100 MG tablet Take 100 mg by mouth 2 (two) times daily.    Marland Kitchen aspirin 81 MG tablet Take 81 mg by mouth every other day.     . Budesonide (UCERIS) 9 MG TB24 Take 9 mg by mouth daily.     . Certolizumab Pegol (CIMZIA [place]) Inject 400 mg into the skin every 30 (thirty) days. 200 mg on each side of stomach    . Cholecalciferol (VITAMIN D3) 1000 UNITS CAPS Take 1,000 Units by mouth 2 (two) times daily.     . citalopram (CELEXA) 40 MG tablet Take 40 mg by mouth daily.    .  Cyanocobalamin (VITAMIN B-12) 1000 MCG SUBL Place 1,000 mcg under the tongue daily.    . cyclobenzaprine (FLEXERIL) 10 MG tablet Take 10 mg by mouth daily.     . folic acid (FOLVITE) 1 MG tablet Take 1 tablet (1 mg total) by mouth daily. 30 tablet 3  . LEVEMIR FLEXTOUCH 100 UNIT/ML Pen Inject 14 Units into the skin 2 (two) times daily.     Marland Kitchen lisinopril-hydrochlorothiazide (ZESTORETIC) 20-25 MG tablet Take 1 tablet by mouth daily.    Marland Kitchen loperamide (IMODIUM) 2 MG capsule Take 6 mg by mouth 2 (two) times daily.    . Melatonin 5 MG SUBL Place 5 mg under the tongue at bedtime.    . metFORMIN (GLUCOPHAGE-XR) 500 MG 24 hr tablet Take 500 mg by mouth 2 (two) times daily.     . methotrexate (RHEUMATREX) 2.5 MG tablet Take 8 tablets (20 mg total) by mouth once a week. Caution: Chemotherapy. Protect from light. Takes on Friday - hold for 2 weeks. 4 tablet 0  . mupirocin cream (BACTROBAN) 2 % Apply 1 application topically 2 (two) times daily as needed (psoriasis).     Marland Kitchen omeprazole (PRILOSEC) 40 MG capsule Take 40 mg by mouth at bedtime.    . potassium chloride (K-DUR) 10 MEQ tablet Take 10 mEq by mouth daily.     . predniSONE (STERAPRED UNI-PAK  21 TAB) 10 MG (21) TBPK tablet Use per package instruction 21 tablet 0  . Probiotic Product (ALIGN PO) Take 1 capsule by mouth daily.     No current facility-administered medications on file prior to visit.    Past Medical History:  Diagnosis Date  . Arthritis   . Deep vein thrombosis ([place])    right calf - 05/2012   . Diabetes mellitus without complication (HCC)    diet controlled   . GERD (gastroesophageal reflux disease)   . Hyperlipidemia   . Hypertension    Ejection fraction =>55% Left ventricular systolic function is normal. Left ventricular wall motion is normal    . Lymphocytic colitis   . Neuropathy    diabetic - in bilateral feet   . Pityriasis lichenoides chronica   . Sleep apnea    bipap    Past Surgical History:  Procedure Laterality  Date  . DILATION AND CURETTAGE OF UTERUS    . HAMMER TOE SURGERY    . right hand surgery      due to blood infection   . torn meniscus repair      right knee   . TOTAL HIP ARTHROPLASTY  09/13/2012   Procedure: TOTAL HIP ARTHROPLASTY ANTERIOR APPROACH;  Surgeon: Mauri Pole, MD;  Location: [facility];  Service: Orthopedics;  Laterality: Right;    Social History   Socioeconomic History  . Marital status: Divorced    Spouse name: Not on file  . Number of children: Not on file  . Years of education: Not on file  . Highest education level: Not on file  Occupational History  . Not on file  Tobacco Use  . Smoking status: Never Smoker  . Smokeless tobacco: Never Used  Substance and Sexual Activity  . Alcohol use: Yes    Comment: occasional wine   . Drug use: No  . Sexual activity: Not on file  Other Topics Concern  . Not on file  Social History Narrative   She is widowed. She has 2 children, 2 grandchildren. She does drink occasional alcohol . There is no tobacco . She does not have a routine exercise program   Social Determinants of Health   Financial Resource Strain:   . Difficulty of Paying Living Expenses: Not on file  Food Insecurity:   . Worried About Charity fundraiser in the Last Year: Not on file  . Ran Out of Food in the Last Year: Not on file  Transportation Needs:   . Lack of Transportation (Medical): Not on file  . Lack of Transportation (Non-Medical): Not on file  Physical Activity:   . Days of Exercise per Week: Not on file  . Minutes of Exercise per Session: Not on file  Stress:   . Feeling of Stress : Not on file  Social Connections:   . Frequency of Communication with Friends and Family: Not on file  . Frequency of Social Gatherings with Friends and Family: Not on file  . Attends Religious Services: Not on file  . Active Member of Clubs or Organizations: Not on file  . Attends Archivist Meetings: Not on file  . Marital Status: Not on file     Family History  Problem Relation Age of Onset  . Hypertension Mother     Review of Systems     Objective:  There were no vitals filed for this visit. BP Readings from Last 3 Encounters:  07/13/19 (!) 148/89  01/12/19 128/77  07/11/18  132/64   Wt Readings from Last 3 Encounters:  07/13/19 268 lb 15.4 oz (122 kg)  01/12/19 262 lb (118.8 kg)  07/11/18 262 lb 9.6 oz (119.1 kg)   There is no height or weight on file to calculate BMI.   Physical Exam          Assessment & Plan:    See Problem List for Assessment and Plan of chronic medical problems.    This visit occurred during the SARS-CoV-2 public health emergency.  Safety protocols were in place, including screening questions prior to the visit, additional usage of staff PPE, and extensive cleaning of exam room while observing appropriate contact time as indicated for disinfecting solutions.    This encounter was created in error - please disregard.

## 2019-08-01 ENCOUNTER — Encounter: Payer: Medicare Other | Admitting: Internal Medicine

## 2019-08-07 ENCOUNTER — Encounter: Payer: Self-pay | Admitting: Internal Medicine

## 2019-08-07 DIAGNOSIS — Z8739 Personal history of other diseases of the musculoskeletal system and connective tissue: Secondary | ICD-10-CM | POA: Insufficient documentation

## 2019-08-07 DIAGNOSIS — G4700 Insomnia, unspecified: Secondary | ICD-10-CM | POA: Insufficient documentation

## 2019-08-07 DIAGNOSIS — F329 Major depressive disorder, single episode, unspecified: Secondary | ICD-10-CM | POA: Insufficient documentation

## 2019-08-07 DIAGNOSIS — F419 Anxiety disorder, unspecified: Secondary | ICD-10-CM | POA: Insufficient documentation

## 2019-08-07 DIAGNOSIS — N390 Urinary tract infection, site not specified: Secondary | ICD-10-CM | POA: Insufficient documentation

## 2019-08-07 DIAGNOSIS — L4050 Arthropathic psoriasis, unspecified: Secondary | ICD-10-CM | POA: Insufficient documentation

## 2019-08-07 DIAGNOSIS — M14679 Charcot's joint, unspecified ankle and foot: Secondary | ICD-10-CM | POA: Insufficient documentation

## 2019-08-07 DIAGNOSIS — M109 Gout, unspecified: Secondary | ICD-10-CM | POA: Insufficient documentation

## 2019-08-07 DIAGNOSIS — F32A Depression, unspecified: Secondary | ICD-10-CM | POA: Insufficient documentation

## 2019-08-10 LAB — HM MAMMOGRAPHY

## 2019-08-13 NOTE — Progress Notes (Signed)
Subjective:    Patient ID: [name] [name], female    DOB: 1946-01-15, 74 y.o.   MRN: [id number]  HPI  She is here to establish with a new pcp.  The patient is here for follow up.  COVID with PNA:  She was treated with IV steroids and IV remdesivir.  She had convalescent plasma on 11/30.  She initially had oxygen requirement. She still has symptoms.  Last week she had a lot of coughing and was able to get up a lot of junk in her chest and her SOB is better.  She still feels there is junk in her chest.  She is more fatigued than usual.  She lost her sense of taste and smell.  She is sleeping more at night - can sleep 12 hrs, which is unusual for her.  She does not sleep during the day.   Hypertension: She is taking her medication daily. She is compliant with a low sodium diet.   She does monitor her blood pressure at home - 120/77 -148/84.    Diabetes: She is taking her medication daily as prescribed. her a1c went from 7.1 to 10 last year over a short period of time.  She is unsure why this occurred.  She is typically compliant with a diabetic diet, but has not been as compliant since coming home from the hospital.  She monitors her sugars and they have been running 133-197 prior to going to the hospital.   H/o DVT in 2013: This was after a long flight.  She did complete anticoagulation for 6 months and has not had any issues since then.  OSA: she uses BIPAP at home.    Lymphocytic colitis:  She has severe disease and is on budesonide chronically. She is on methotrexate.  She is also on cimzia.  Her symptoms are currently well controlled.  Depression: She is taking her medication daily as prescribed. She denies any side effects from the medication. She feels her depression is well controlled and she is happy with her current dose of medication.   GERD:  She is taking her medication daily as prescribed.  She denies any GERD symptoms and feels her GERD is well controlled.   Takes flexeril to  help her get to sleep.  (Self pays due to Medicare).  Medication also helps her muscles relax from all of her arthritis.  Diabetic foot ulcer: She is following with Dr. Doran Durand.  She is currently wearing a cast to help stabilize her Charcot joint.  The collapse of the joint is likely what caused the ulcer.  She will be eventually getting a new orthotic, which hopefully will help.  She is currently walking with a walker.   Medications and allergies reviewed with patient and updated if appropriate.  Patient Active Problem List   Diagnosis Date Noted  . Insomnia 08/07/2019  . Anxiety 08/07/2019  . Psoriatic arthritis ([place]) 08/07/2019  . H/O: gout 08/07/2019  . Recurrent UTI 08/07/2019  . Charcot's joint of foot 08/07/2019  . Depression 08/07/2019  . Pneumonia due to COVID-19 virus 07/09/2019  . Physical deconditioning 07/08/2019  . COVID-19 07/07/2019  . Diabetic peripheral neuropathy ([place]) 04/10/2019  . Pain in left knee 10/13/2018  . Ischemic colitis ([place]) 10/06/2018  . Arthritis 07/21/2018  . Urinary incontinence 07/21/2018  . Diabetic foot ulcer ([place]) 07/13/2018  . Closed fracture of fifth metatarsal bone 05/13/2018  . Cellulitis of toe of left foot 05/02/2018  . DM2 (diabetes mellitus, type  2) ([place]) 05/02/2018  . Psoriasis 05/02/2018  . Pain in joint of right ankle 04/25/2018  . Osteoarthritis of knee 10/18/2017  . Chest pain 01/12/2015  . Right bundle branch block 05/20/2014  . OSA (obstructive sleep apnea), BiPAP 03/12/2013  . Essential hypertension 03/12/2013  . Incomplete RBBB 03/12/2013  . Expected blood loss anemia 09/14/2012  . Obesity 09/14/2012  . Hyponatremia 09/14/2012  . S/P right THA, AA 09/13/2012  . Deep venous thrombosis of lower extremity ([place]) 08/10/2010  . 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 acetaminophen (TYLENOL) 325 MG tablet Take 2 tablets (650 mg  total) by mouth every 6 (six) hours as needed for mild pain or headache.    . allopurinol (ZYLOPRIM) 100 MG tablet Take 100 mg by mouth 2 (two) times daily.    Marland Kitchen aspirin 81 MG tablet Take 81 mg by mouth every other day.     . Budesonide (UCERIS) 9 MG TB24 Take 9 mg by mouth daily.     . Certolizumab Pegol (CIMZIA [place]) Inject 400 mg into the skin every 30 (thirty) days. 200 mg on each side of stomach    . Cholecalciferol (VITAMIN D3) 1000 UNITS CAPS Take 1,000 Units by mouth 2 (two) times daily.     . citalopram (CELEXA) 40 MG tablet Take 40 mg by mouth daily.    . Cyanocobalamin (VITAMIN B-12) 1000 MCG SUBL Place 1,000 mcg under the tongue daily.    . cyclobenzaprine (FLEXERIL) 10 MG tablet Take 10 mg by mouth daily.     . folic acid (FOLVITE) 1 MG tablet Take 1 tablet (1 mg total) by mouth daily. 30 tablet 3  . LEVEMIR FLEXTOUCH 100 UNIT/ML Pen Inject 14 Units into the skin 2 (two) times daily.     Marland Kitchen lisinopril-hydrochlorothiazide (ZESTORETIC) 20-25 MG tablet Take 1 tablet by mouth daily.    Marland Kitchen loperamide (IMODIUM) 2 MG capsule Take 6 mg by mouth 2 (two) times daily.    . Melatonin 5 MG SUBL Place 5 mg under the tongue at bedtime.    . metFORMIN (GLUCOPHAGE-XR) 500 MG 24 hr tablet Take 500 mg by mouth 2 (two) times daily.     . methotrexate (RHEUMATREX) 2.5 MG tablet Take 8 tablets (20 mg total) by mouth once a week. Caution: Chemotherapy. Protect from light. Takes on Friday - hold for 2 weeks. 4 tablet 0  . mupirocin cream (BACTROBAN) 2 % Apply 1 application topically 2 (two) times daily as needed (psoriasis).     Marland Kitchen omeprazole (PRILOSEC) 40 MG capsule Take 40 mg by mouth at bedtime.    . potassium chloride (K-DUR) 10 MEQ tablet Take 10 mEq by mouth daily.     . Probiotic Product (ALIGN PO) Take 1 capsule by mouth daily.     No current facility-administered medications on file prior to visit.    Past Medical History:  Diagnosis Date  . Arthritis   . Deep vein thrombosis ([place])    right  calf - 05/2012   . Diabetes mellitus without complication (HCC)    diet controlled   . GERD (gastroesophageal reflux disease)   . Hyperlipidemia   . Hypertension    Ejection fraction =>55% Left ventricular systolic function is normal. Left ventricular wall motion is normal    . Lymphocytic colitis   . Neuropathy    diabetic - in bilateral feet   . Pityriasis lichenoides chronica   .  Sleep apnea    bipap    Past Surgical History:  Procedure Laterality Date  . DILATION AND CURETTAGE OF UTERUS    . HAMMER TOE SURGERY    . right hand surgery      due to blood infection   . torn meniscus repair      right knee   . TOTAL HIP ARTHROPLASTY  09/13/2012   Procedure: TOTAL HIP ARTHROPLASTY ANTERIOR APPROACH;  Surgeon: Mauri Pole, MD;  Location: [facility];  Service: Orthopedics;  Laterality: Right;    Social History   Socioeconomic History  . Marital status: Divorced    Spouse name: Not on file  . Number of children: Not on file  . Years of education: Not on file  . Highest education level: Not on file  Occupational History  . Not on file  Tobacco Use  . Smoking status: Never Smoker  . Smokeless tobacco: Never Used  Substance and Sexual Activity  . Alcohol use: Yes    Comment: occasional wine   . Drug use: No  . Sexual activity: Not on file  Other Topics Concern  . Not on file  Social History Narrative   She is widowed. She has 2 children, 2 grandchildren. She does drink occasional alcohol . There is no tobacco . She does not have a routine exercise program   Social Determinants of Health   Financial Resource Strain:   . Difficulty of Paying Living Expenses: Not on file  Food Insecurity:   . Worried About Charity fundraiser in the Last Year: Not on file  . Ran Out of Food in the Last Year: Not on file  Transportation Needs:   . Lack of Transportation (Medical): Not on file  . Lack of Transportation (Non-Medical): Not on file  Physical Activity:   . Days of Exercise  per Week: Not on file  . Minutes of Exercise per Session: Not on file  Stress:   . Feeling of Stress : Not on file  Social Connections:   . Frequency of Communication with Friends and Family: Not on file  . Frequency of Social Gatherings with Friends and Family: Not on file  . Attends Religious Services: Not on file  . Active Member of Clubs or Organizations: Not on file  . Attends Archivist Meetings: Not on file  . Marital Status: Not on file    Family History  Problem Relation Age of Onset  . Hypertension Mother     Review of Systems  Constitutional: Positive for appetite change (varies - comes and goes) and fatigue. Negative for chills and fever.  Respiratory: Positive for cough and shortness of breath. Negative for wheezing.   Cardiovascular: Positive for leg swelling. Negative for chest pain and palpitations.  Gastrointestinal: Negative for abdominal pain, blood in stool, constipation, diarrhea and nausea.       No GERD  Neurological: Positive for light-headedness (since covid - improving) and headaches (since COVID). Negative for dizziness and numbness.       Objective:   Vitals:   08/15/19 1010  BP: (!) 142/72  Pulse: 88  Resp: 18  Temp: 98.4 F (36.9 C)  SpO2: 96%   BP Readings from Last 3 Encounters:  08/15/19 (!) 142/72  07/13/19 (!) 148/89  01/12/19 128/77   Wt Readings from Last 3 Encounters:  07/13/19 268 lb 15.4 oz (122 kg)  01/12/19 262 lb (118.8 kg)  07/11/18 262 lb 9.6 oz (119.1 kg)   Body mass  index is 43.41 kg/m.   Physical Exam    Constitutional: Appears well-developed and well-nourished. No distress.  HENT:  Head: Normocephalic and atraumatic.  Neck: Neck supple. No tracheal deviation present. No thyromegaly present.  No cervical lymphadenopathy Cardiovascular: Normal rate, regular rhythm and normal heart sounds.   No murmur heard. No carotid bruit .  No edema Pulmonary/Chest: Effort normal and breath sounds normal. No  respiratory distress. No has no wheezes. No rales.  Abdomen: soft, NT, ND Skin: Skin is warm and dry. Not diaphoretic.  Psychiatric: Normal mood and affect. Behavior is normal.      Assessment & Plan:    See Problem List for Assessment and Plan of chronic medical problems.    This visit occurred during the SARS-CoV-2 public health emergency.  Safety protocols were in place, including screening questions prior to the visit, additional usage of staff PPE, and extensive cleaning of exam room while observing appropriate contact time as indicated for disinfecting solutions.

## 2019-08-15 ENCOUNTER — Other Ambulatory Visit: Payer: Self-pay

## 2019-08-15 ENCOUNTER — Encounter: Payer: Self-pay | Admitting: Internal Medicine

## 2019-08-15 ENCOUNTER — Ambulatory Visit (INDEPENDENT_AMBULATORY_CARE_PROVIDER_SITE_OTHER): Payer: Medicare Other | Admitting: Internal Medicine

## 2019-08-15 VITALS — BP 142/72 | HR 88 | Temp 98.4°F | Resp 18 | Ht 66.0 in

## 2019-08-15 DIAGNOSIS — F3289 Other specified depressive episodes: Secondary | ICD-10-CM | POA: Diagnosis not present

## 2019-08-15 DIAGNOSIS — G4733 Obstructive sleep apnea (adult) (pediatric): Secondary | ICD-10-CM

## 2019-08-15 DIAGNOSIS — E1169 Type 2 diabetes mellitus with other specified complication: Secondary | ICD-10-CM

## 2019-08-15 DIAGNOSIS — E1142 Type 2 diabetes mellitus with diabetic polyneuropathy: Secondary | ICD-10-CM | POA: Diagnosis not present

## 2019-08-15 DIAGNOSIS — U071 COVID-19: Secondary | ICD-10-CM | POA: Diagnosis not present

## 2019-08-15 DIAGNOSIS — G4700 Insomnia, unspecified: Secondary | ICD-10-CM

## 2019-08-15 DIAGNOSIS — R32 Unspecified urinary incontinence: Secondary | ICD-10-CM

## 2019-08-15 DIAGNOSIS — F419 Anxiety disorder, unspecified: Secondary | ICD-10-CM | POA: Diagnosis not present

## 2019-08-15 DIAGNOSIS — I10 Essential (primary) hypertension: Secondary | ICD-10-CM

## 2019-08-15 DIAGNOSIS — K52832 Lymphocytic colitis: Secondary | ICD-10-CM | POA: Diagnosis not present

## 2019-08-15 LAB — LIPID PANEL
Cholesterol: 211 mg/dL — ABNORMAL HIGH (ref 0–200)
HDL: 47.8 mg/dL (ref >39.00)
NonHDL: 162.72
Total CHOL/HDL Ratio: 4
Triglycerides: 283 mg/dL — ABNORMAL HIGH (ref 0.0–149.0)
VLDL: 56.6 mg/dL — ABNORMAL HIGH (ref 0.0–40.0)

## 2019-08-15 LAB — COMPREHENSIVE METABOLIC PANEL
ALT: 40 U/L — ABNORMAL HIGH (ref 0–35)
AST: 37 U/L (ref 0–37)
Albumin: 3.8 g/dL (ref 3.5–5.2)
Alkaline Phosphatase: 57 U/L (ref 39–117)
BUN: 13 mg/dL (ref 6–23)
CO2: 28 mEq/L (ref 19–32)
Calcium: 9.9 mg/dL (ref 8.4–10.5)
Chloride: 93 mEq/L — ABNORMAL LOW (ref 96–112)
Creatinine, Ser: 0.8 mg/dL (ref 0.40–1.20)
GFR: 70.11 mL/min (ref >60.00)
Glucose, Bld: 176 mg/dL — ABNORMAL HIGH (ref 70–99)
Potassium: 3.4 mEq/L — ABNORMAL LOW (ref 3.5–5.1)
Sodium: 135 mEq/L (ref 135–145)
Total Bilirubin: 0.5 mg/dL (ref 0.2–1.2)
Total Protein: 6.8 g/dL (ref 6.0–8.3)

## 2019-08-15 LAB — CBC WITH DIFFERENTIAL/PLATELET
Basophils Absolute: 0.1 10*3/uL (ref 0.0–0.1)
Basophils Relative: 0.5 % (ref 0.0–3.0)
Eosinophils Absolute: 0.1 10*3/uL (ref 0.0–0.7)
Eosinophils Relative: 0.9 % (ref 0.0–5.0)
HCT: 40.1 % (ref 36.0–46.0)
Hemoglobin: 12.9 g/dL (ref 12.0–15.0)
Lymphocytes Relative: 30.1 % (ref 12.0–46.0)
Lymphs Abs: 4.9 10*3/uL — ABNORMAL HIGH (ref 0.7–4.0)
MCHC: 32.1 g/dL (ref 30.0–36.0)
MCV: 89.9 fl (ref 78.0–100.0)
Monocytes Absolute: 1.1 10*3/uL — ABNORMAL HIGH (ref 0.1–1.0)
Monocytes Relative: 6.6 % (ref 3.0–12.0)
Neutro Abs: 10 10*3/uL — ABNORMAL HIGH (ref 1.4–7.7)
Neutrophils Relative %: 61.9 % (ref 43.0–77.0)
Platelets: 295 10*3/uL (ref 150.0–400.0)
RBC: 4.47 Mil/uL (ref 3.87–5.11)
RDW: 18.7 % — ABNORMAL HIGH (ref 11.5–15.5)
WBC: 16.1 10*3/uL — ABNORMAL HIGH (ref 4.0–10.5)

## 2019-08-15 LAB — HEMOGLOBIN A1C: Hgb A1c MFr Bld: 9.5 % — ABNORMAL HIGH (ref 4.6–6.5)

## 2019-08-15 LAB — LDL CHOLESTEROL, DIRECT: Direct LDL: 139 mg/dL

## 2019-08-15 MED ORDER — DAPAGLIFLOZIN PROPANEDIOL 5 MG PO TABS: 5.0000 mg | ORAL_TABLET | Freq: Every day | ORAL | 5 refills | Status: DC

## 2019-08-15 NOTE — Assessment & Plan Note (Signed)
Controlled, stable Continue current dose of medication  

## 2019-08-15 NOTE — Assessment & Plan Note (Signed)
Numbness bilateral feet Currently following with Dr. Doran Durand for Charcot joint and foot ulcer

## 2019-08-15 NOTE — Assessment & Plan Note (Signed)
Still has some residual symptoms of decreased taste and smell, fatigue, cough and some shortness of breath In the past week she has seen improvement in the cough and shortness of breath Continue symptomatic treatment Expect symptoms to continue to improve

## 2019-08-15 NOTE — Assessment & Plan Note (Signed)
Chronic, not ideally controlled We will check A1c today Less compliant with diabetic diet since Covid diagnosis Taking Metformin twice daily and Levemir 14 units twice daily Given elevated sugars at home and while in the hospital will start Farxiga 5 mg daily Hopefully with time she will be more active and have better compliance with a diabetic diet as she recovers from Covid Goal will be to get her off insulin over time

## 2019-08-15 NOTE — Assessment & Plan Note (Signed)
Taking Flexeril at night-typically pays for medication since is not covered by Medicare Has been helpful for muscle aches, joint pain and helping her sleep

## 2019-08-15 NOTE — Patient Instructions (Addendum)
  Blood work was ordered.     Medications reviewed and updated.  Changes include :   Start farxiga 5 mg daily  Your prescription(s) have been submitted to your pharmacy. Please take as directed and contact our office if you believe you are having problem(s) with the medication(s).     Please followup in 3 months

## 2019-08-15 NOTE — Assessment & Plan Note (Signed)
Following with Dr. Cristina Gong Overall controlled Taking budesonide, methotrexate and Cimzia

## 2019-08-15 NOTE — Assessment & Plan Note (Addendum)
Increased since covid Hopefully will improve as her coughing continues to improve Can consider pelvic PT in the future

## 2019-08-15 NOTE — Assessment & Plan Note (Signed)
Uses BiPAP nightly

## 2019-08-15 NOTE — Assessment & Plan Note (Addendum)
Chronic BP well controlled Current regimen effective and well tolerated Continue current medications at current doses CMP 

## 2019-08-17 ENCOUNTER — Encounter: Payer: Self-pay | Admitting: Internal Medicine

## 2019-08-18 DIAGNOSIS — L97522 Non-pressure chronic ulcer of other part of left foot with fat layer exposed: Secondary | ICD-10-CM | POA: Diagnosis not present

## 2019-08-20 ENCOUNTER — Encounter: Payer: Self-pay | Admitting: Internal Medicine

## 2019-08-21 MED ORDER — ROSUVASTATIN CALCIUM 5 MG PO TABS: 5.0000 mg | ORAL_TABLET | Freq: Every day | ORAL | 3 refills | Status: DC

## 2019-08-21 NOTE — Addendum Note (Signed)
Addended by: Binnie Rail on: 08/21/2019 07:40 AM   Modules accepted: Orders

## 2019-08-23 DIAGNOSIS — L4059 Other psoriatic arthropathy: Secondary | ICD-10-CM | POA: Diagnosis not present

## 2019-08-29 ENCOUNTER — Ambulatory Visit: Payer: Medicare Other | Admitting: Cardiovascular Disease

## 2019-08-30 ENCOUNTER — Encounter: Payer: Self-pay | Admitting: Internal Medicine

## 2019-08-30 ENCOUNTER — Other Ambulatory Visit: Payer: Self-pay

## 2019-08-30 MED ORDER — LEVEMIR FLEXTOUCH 100 UNIT/ML ~~LOC~~ SOPN: 14.0000 [IU] | PEN_INJECTOR | Freq: Two times a day (BID) | SUBCUTANEOUS | 3 refills | Status: DC

## 2019-08-30 MED ORDER — DOCUSATE SODIUM 100 MG PO CAPS: 100.0000 mg | ORAL_CAPSULE | Freq: Two times a day (BID) | ORAL | 5 refills | Status: DC

## 2019-09-01 DIAGNOSIS — E13621 Other specified diabetes mellitus with foot ulcer: Secondary | ICD-10-CM | POA: Diagnosis not present

## 2019-09-01 DIAGNOSIS — M19072 Primary osteoarthritis, left ankle and foot: Secondary | ICD-10-CM | POA: Diagnosis not present

## 2019-09-01 DIAGNOSIS — E1140 Type 2 diabetes mellitus with diabetic neuropathy, unspecified: Secondary | ICD-10-CM | POA: Diagnosis not present

## 2019-09-01 MED ORDER — FLUCONAZOLE 150 MG PO TABS: 150.0000 mg | ORAL_TABLET | Freq: Once | ORAL | 0 refills | Status: AC

## 2019-09-01 NOTE — Addendum Note (Signed)
Addended by: Binnie Rail on: 09/01/2019 11:58 AM   Modules accepted: Orders

## 2019-09-07 ENCOUNTER — Encounter: Payer: Self-pay | Admitting: Internal Medicine

## 2019-09-09 DIAGNOSIS — Z23 Encounter for immunization: Secondary | ICD-10-CM | POA: Diagnosis not present

## 2019-09-15 DIAGNOSIS — E1140 Type 2 diabetes mellitus with diabetic neuropathy, unspecified: Secondary | ICD-10-CM | POA: Diagnosis not present

## 2019-09-15 DIAGNOSIS — E13621 Other specified diabetes mellitus with foot ulcer: Secondary | ICD-10-CM | POA: Diagnosis not present

## 2019-09-15 DIAGNOSIS — M19072 Primary osteoarthritis, left ankle and foot: Secondary | ICD-10-CM | POA: Diagnosis not present

## 2019-09-16 ENCOUNTER — Encounter: Payer: Self-pay | Admitting: Internal Medicine

## 2019-09-17 NOTE — Progress Notes (Deleted)
Subjective:    Patient ID: [name] [name], female    DOB: 09-27-1945, 74 y.o.   MRN: [id number]  HPI The patient is here for an acute visit.   Rash  Medications and allergies reviewed with patient and updated if appropriate.  Patient Active Problem List   Diagnosis Date Noted  . Insomnia 08/07/2019  . Anxiety 08/07/2019  . Psoriatic arthritis ([place]) 08/07/2019  . H/O: gout 08/07/2019  . Recurrent UTI 08/07/2019  . Charcot's joint of left foot 08/07/2019  . Depression 08/07/2019  . Pneumonia due to COVID-19 virus 07/09/2019  . Physical deconditioning 07/08/2019  . COVID-19 07/07/2019  . Diabetic peripheral neuropathy ([place]) 04/10/2019  . Pain in left knee 10/13/2018  . Ischemic colitis ([place]) 10/06/2018  . Arthritis 07/21/2018  . Urinary incontinence 07/21/2018  . Diabetic foot ulcer ([place]) 07/13/2018  . Cellulitis of toe of left foot 05/02/2018  . DM2 (diabetes mellitus, type 2) ([place]) 05/02/2018  . Psoriasis 05/02/2018  . Osteoarthritis of knee 10/18/2017  . Chest pain 01/12/2015  . Right bundle branch block 05/20/2014  . OSA (obstructive sleep apnea), BiPAP 03/12/2013  . Essential hypertension 03/12/2013  . Obesity 09/14/2012  . Hyponatremia 09/14/2012  . S/P right THA, AA 09/13/2012  . Deep venous thrombosis of lower extremity ([place]) 08/10/2010  . 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 acetaminophen (TYLENOL) 325 MG tablet Take 2 tablets (650 mg total) by mouth every 6 (six) hours as needed for mild pain or headache.    . allopurinol (ZYLOPRIM) 100 MG tablet Take 100 mg by mouth 2 (two) times daily.    Marland Kitchen aspirin 81 MG tablet Take 81 mg by mouth every other day.     . Budesonide (UCERIS) 9 MG TB24 Take 9 mg by mouth daily.     . Certolizumab Pegol (CIMZIA [place]) Inject 400 mg into the skin every 30 (thirty) days. 200 mg on each side of stomach    . Cholecalciferol (VITAMIN  D3) 1000 UNITS CAPS Take 1,000 Units by mouth 2 (two) times daily.     . citalopram (CELEXA) 40 MG tablet Take 40 mg by mouth daily.    . Cyanocobalamin (VITAMIN B-12) 1000 MCG SUBL Place 1,000 mcg under the tongue daily.    . cyclobenzaprine (FLEXERIL) 10 MG tablet Take 10 mg by mouth daily.     . dapagliflozin propanediol (FARXIGA) 5 MG TABS tablet Take 5 mg by mouth daily. 30 tablet 5  . docusate sodium (COLACE) 100 MG capsule Take 1 capsule (100 mg total) by mouth 2 (two) times daily. 60 capsule 5  . folic acid (FOLVITE) 1 MG tablet Take 1 tablet (1 mg total) by mouth daily. 30 tablet 3  . LEVEMIR FLEXTOUCH 100 UNIT/ML Pen Inject 14 Units into the skin 2 (two) times daily. 15 mL 3  . lisinopril-hydrochlorothiazide (ZESTORETIC) 20-25 MG tablet Take 1 tablet by mouth daily.    Marland Kitchen loperamide (IMODIUM) 2 MG capsule Take 6 mg by mouth 2 (two) times daily.    . Melatonin 5 MG SUBL Place 5 mg under the tongue at bedtime.    . metFORMIN (GLUCOPHAGE-XR) 500 MG 24 hr tablet Take 500 mg by mouth 2 (two) times daily.     . methotrexate (RHEUMATREX) 2.5 MG tablet Take 8 tablets (20 mg total) by mouth once a week. Caution: Chemotherapy. Protect from light. Takes on Friday - hold for 2 weeks.  4 tablet 0  . mupirocin cream (BACTROBAN) 2 % Apply 1 application topically 2 (two) times daily as needed (psoriasis).     Marland Kitchen omeprazole (PRILOSEC) 40 MG capsule Take 40 mg by mouth at bedtime.    . potassium chloride (K-DUR) 10 MEQ tablet Take 20 mEq by mouth daily.    . Probiotic Product (ALIGN PO) Take 1 capsule by mouth daily.    . rosuvastatin (CRESTOR) 5 MG tablet Take 1 tablet (5 mg total) by mouth daily. 90 tablet 3   No current facility-administered medications on file prior to visit.    Past Medical History:  Diagnosis Date  . Arthritis   . Closed fracture of fifth metatarsal bone 05/13/2018  . Deep vein thrombosis ([place])    right calf - 05/2012   . Diabetes mellitus without complication (HCC)    diet  controlled   . GERD (gastroesophageal reflux disease)   . Hyperlipidemia   . Hypertension    Ejection fraction =>55% Left ventricular systolic function is normal. Left ventricular wall motion is normal    . Lymphocytic colitis   . Neuropathy    diabetic - in bilateral feet   . Pityriasis lichenoides chronica   . Sleep apnea    bipap    Past Surgical History:  Procedure Laterality Date  . DILATION AND CURETTAGE OF UTERUS    . HAMMER TOE SURGERY    . right hand surgery      due to blood infection   . torn meniscus repair      right knee   . TOTAL HIP ARTHROPLASTY  09/13/2012   Procedure: TOTAL HIP ARTHROPLASTY ANTERIOR APPROACH;  Surgeon: Mauri Pole, MD;  Location: [facility];  Service: Orthopedics;  Laterality: Right;    Social History   Socioeconomic History  . Marital status: Divorced    Spouse name: Not on file  . Number of children: Not on file  . Years of education: Not on file  . Highest education level: Not on file  Occupational History  . Not on file  Tobacco Use  . Smoking status: Never Smoker  . Smokeless tobacco: Never Used  Substance and Sexual Activity  . Alcohol use: Yes    Comment: occasional wine   . Drug use: No  . Sexual activity: Not on file  Other Topics Concern  . Not on file  Social History Narrative   She is widowed. She has 2 children, 2 grandchildren. She does drink occasional alcohol . There is no tobacco . She does not have a routine exercise program   Social Determinants of Health   Financial Resource Strain:   . Difficulty of Paying Living Expenses: Not on file  Food Insecurity:   . Worried About Charity fundraiser in the Last Year: Not on file  . Ran Out of Food in the Last Year: Not on file  Transportation Needs:   . Lack of Transportation (Medical): Not on file  . Lack of Transportation (Non-Medical): Not on file  Physical Activity:   . Days of Exercise per Week: Not on file  . Minutes of Exercise per Session: Not on file    Stress:   . Feeling of Stress : Not on file  Social Connections:   . Frequency of Communication with Friends and Family: Not on file  . Frequency of Social Gatherings with Friends and Family: Not on file  . Attends Religious Services: Not on file  . Active Member of Clubs or Organizations:  Not on file  . Attends Archivist Meetings: Not on file  . Marital Status: Not on file    Family History  Problem Relation Age of Onset  . Hypertension Mother     Review of Systems     Objective:  There were no vitals filed for this visit. BP Readings from Last 3 Encounters:  08/15/19 (!) 142/72  07/13/19 (!) 148/89  01/12/19 128/77   Wt Readings from Last 3 Encounters:  07/13/19 268 lb 15.4 oz (122 kg)  01/12/19 262 lb (118.8 kg)  07/11/18 262 lb 9.6 oz (119.1 kg)   There is no height or weight on file to calculate BMI.   Physical Exam         Assessment & Plan:    See Problem List for Assessment and Plan of chronic medical problems.    This visit occurred during the SARS-CoV-2 public health emergency.  Safety protocols were in place, including screening questions prior to the visit, additional usage of staff PPE, and extensive cleaning of exam room while observing appropriate contact time as indicated for disinfecting solutions.

## 2019-09-18 ENCOUNTER — Ambulatory Visit: Payer: Medicare Other | Admitting: Internal Medicine

## 2019-09-18 ENCOUNTER — Encounter: Payer: Self-pay | Admitting: Internal Medicine

## 2019-09-18 ENCOUNTER — Ambulatory Visit (INDEPENDENT_AMBULATORY_CARE_PROVIDER_SITE_OTHER): Payer: Medicare Other | Admitting: Internal Medicine

## 2019-09-18 ENCOUNTER — Other Ambulatory Visit: Payer: Self-pay

## 2019-09-18 DIAGNOSIS — L411 Pityriasis lichenoides chronica: Secondary | ICD-10-CM

## 2019-09-18 DIAGNOSIS — R110 Nausea: Secondary | ICD-10-CM | POA: Diagnosis not present

## 2019-09-18 MED ORDER — TRIAMCINOLONE ACETONIDE 0.1 % EX CREA: 1.0000 "application " | TOPICAL_CREAM | Freq: Two times a day (BID) | CUTANEOUS | 0 refills | Status: DC

## 2019-09-18 MED ORDER — DOXYCYCLINE HYCLATE 100 MG PO TABS: 100.0000 mg | ORAL_TABLET | Freq: Two times a day (BID) | ORAL | 1 refills | Status: DC

## 2019-09-18 MED ORDER — ONDANSETRON 4 MG PO TBDP: 4.0000 mg | ORAL_TABLET | Freq: Three times a day (TID) | ORAL | 0 refills | Status: DC | PRN

## 2019-09-18 NOTE — Assessment & Plan Note (Signed)
Acute ? Related to covid vaccine 2 zofran prn - rx sent to pharmacy

## 2019-09-18 NOTE — Assessment & Plan Note (Signed)
Acute Started 2/5 - she has had this prior many years ago Will see derm - Dr Derrel Nip Start triamcinolone 0.1% cream topically Start doxycycline 100 mg BID On methotrexate CBC ? Associated with  ? cause

## 2019-09-18 NOTE — Progress Notes (Signed)
Subjective:    Patient ID: [name] [name], female    DOB: 1946-03-01, 74 y.o.   MRN: [id number]  HPI The patient is here for an acute visit.  covid #2 right upper arm 9 days ago.    Rash started Friday.  She has red dots on LE b/l.  The papules  get bigger and get a iwhte thing on top.  She has the rash on her arms, legs, and b/l groin regions.  It itches, but is tolerable.     Since the COVID vaccine she has not felt well.  Her BP was low and HR was high.  She has had low grade fever.  She still has nausea - does not want to eat.  She still has a lot of fatigue.  She slept a lot this weekend.   Medications and allergies reviewed with patient and updated if appropriate.  Patient Active Problem List   Diagnosis Date Noted  . Pityriasis lichenoides chronica 09/18/2019  . Insomnia 08/07/2019  . Anxiety 08/07/2019  . Psoriatic arthritis ([place]) 08/07/2019  . H/O: gout 08/07/2019  . Recurrent UTI 08/07/2019  . Charcot's joint of left foot 08/07/2019  . Depression 08/07/2019  . Pneumonia due to COVID-19 virus 07/09/2019  . Physical deconditioning 07/08/2019  . COVID-19 07/07/2019  . Diabetic peripheral neuropathy ([place]) 04/10/2019  . Pain in left knee 10/13/2018  . Ischemic colitis ([place]) 10/06/2018  . Arthritis 07/21/2018  . Urinary incontinence 07/21/2018  . Diabetic foot ulcer ([place]) 07/13/2018  . Cellulitis of toe of left foot 05/02/2018  . DM2 (diabetes mellitus, type 2) ([place]) 05/02/2018  . Psoriasis 05/02/2018  . Osteoarthritis of knee 10/18/2017  . Chest pain 01/12/2015  . Right bundle branch block 05/20/2014  . OSA (obstructive sleep apnea), BiPAP 03/12/2013  . Essential hypertension 03/12/2013  . Obesity 09/14/2012  . Hyponatremia 09/14/2012  . S/P right THA, AA 09/13/2012  . Deep venous thrombosis of lower extremity ([place]) 08/10/2010  . 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 acetaminophen (TYLENOL) 325 MG tablet Take 2 tablets (650 mg total) by mouth every 6 (six) hours as needed for mild pain or headache.    . allopurinol (ZYLOPRIM) 100 MG tablet Take 100 mg by mouth 2 (two) times daily.    Marland Kitchen aspirin 81 MG tablet Take 81 mg by mouth every other day.     . Budesonide (UCERIS) 9 MG TB24 Take 9 mg by mouth daily.     . Certolizumab Pegol (CIMZIA [place]) Inject 400 mg into the skin every 30 (thirty) days. 200 mg on each side of stomach    . Cholecalciferol (VITAMIN D3) 1000 UNITS CAPS Take 1,000 Units by mouth 2 (two) times daily.     . citalopram (CELEXA) 40 MG tablet Take 40 mg by mouth daily.    . Cyanocobalamin (VITAMIN B-12) 1000 MCG SUBL Place 1,000 mcg under the tongue daily.    . cyclobenzaprine (FLEXERIL) 10 MG tablet Take 10 mg by mouth daily.     . dapagliflozin propanediol (FARXIGA) 5 MG TABS tablet Take 5 mg by mouth daily. 30 tablet 5  . docusate sodium (COLACE) 100 MG capsule Take 1 capsule (100 mg total) by mouth 2 (two) times daily. 60 capsule 5  . folic acid (FOLVITE) 1 MG tablet Take 1 tablet (1 mg total) by mouth daily. 30 tablet 3  . LEVEMIR FLEXTOUCH 100 UNIT/ML Pen  Inject 14 Units into the skin 2 (two) times daily. 15 mL 3  . lisinopril-hydrochlorothiazide (ZESTORETIC) 20-25 MG tablet Take 1 tablet by mouth daily.    Marland Kitchen loperamide (IMODIUM) 2 MG capsule Take 6 mg by mouth 2 (two) times daily.    . Melatonin 5 MG SUBL Place 5 mg under the tongue at bedtime.    . metFORMIN (GLUCOPHAGE-XR) 500 MG 24 hr tablet Take 500 mg by mouth 2 (two) times daily.     . methotrexate (RHEUMATREX) 2.5 MG tablet Take 8 tablets (20 mg total) by mouth once a week. Caution: Chemotherapy. Protect from light. Takes on Friday - hold for 2 weeks. 4 tablet 0  . mupirocin cream (BACTROBAN) 2 % Apply 1 application topically 2 (two) times daily as needed (psoriasis).     Marland Kitchen omeprazole (PRILOSEC) 40 MG capsule Take 40 mg by mouth at bedtime.    . potassium chloride  (K-DUR) 10 MEQ tablet Take 20 mEq by mouth daily.    . Probiotic Product (ALIGN PO) Take 1 capsule by mouth daily.    . rosuvastatin (CRESTOR) 5 MG tablet Take 1 tablet (5 mg total) by mouth daily. 90 tablet 3   No current facility-administered medications on file prior to visit.    Past Medical History:  Diagnosis Date  . Arthritis   . Closed fracture of fifth metatarsal bone 05/13/2018  . Deep vein thrombosis ([place])    right calf - 05/2012   . Diabetes mellitus without complication (HCC)    diet controlled   . GERD (gastroesophageal reflux disease)   . Hyperlipidemia   . Hypertension    Ejection fraction =>55% Left ventricular systolic function is normal. Left ventricular wall motion is normal    . Lymphocytic colitis   . Neuropathy    diabetic - in bilateral feet   . Pityriasis lichenoides chronica   . Sleep apnea    bipap    Past Surgical History:  Procedure Laterality Date  . DILATION AND CURETTAGE OF UTERUS    . HAMMER TOE SURGERY    . right hand surgery      due to blood infection   . torn meniscus repair      right knee   . TOTAL HIP ARTHROPLASTY  09/13/2012   Procedure: TOTAL HIP ARTHROPLASTY ANTERIOR APPROACH;  Surgeon: Mauri Pole, MD;  Location: [facility];  Service: Orthopedics;  Laterality: Right;    Social History   Socioeconomic History  . Marital status: Divorced    Spouse name: Not on file  . Number of children: Not on file  . Years of education: Not on file  . Highest education level: Not on file  Occupational History  . Not on file  Tobacco Use  . Smoking status: Never Smoker  . Smokeless tobacco: Never Used  Substance and Sexual Activity  . Alcohol use: Yes    Comment: occasional wine   . Drug use: No  . Sexual activity: Not on file  Other Topics Concern  . Not on file  Social History Narrative   She is widowed. She has 2 children, 2 grandchildren. She does drink occasional alcohol . There is no tobacco . She does not have a routine exercise  program   Social Determinants of Health   Financial Resource Strain:   . Difficulty of Paying Living Expenses: Not on file  Food Insecurity:   . Worried About Charity fundraiser in the Last Year: Not on file  . Ran  Out of Food in the Last Year: Not on file  Transportation Needs:   . Lack of Transportation (Medical): Not on file  . Lack of Transportation (Non-Medical): Not on file  Physical Activity:   . Days of Exercise per Week: Not on file  . Minutes of Exercise per Session: Not on file  Stress:   . Feeling of Stress : Not on file  Social Connections:   . Frequency of Communication with Friends and Family: Not on file  . Frequency of Social Gatherings with Friends and Family: Not on file  . Attends Religious Services: Not on file  . Active Member of Clubs or Organizations: Not on file  . Attends Archivist Meetings: Not on file  . Marital Status: Not on file    Family History  Problem Relation Age of Onset  . Hypertension Mother     Review of Systems  Constitutional: Positive for fatigue and fever (99 recently).  HENT:       Dry mouth  Cardiovascular: Negative for chest pain and palpitations.  Gastrointestinal: Positive for nausea.  Skin: Positive for rash.  Neurological: Positive for light-headedness.       Objective:   Vitals:   09/18/19 1601  BP: 138/64  Pulse: (!) 123  Resp: 20  Temp: 98.6 F (37 C)  SpO2: 96%   BP Readings from Last 3 Encounters:  09/18/19 138/64  08/15/19 (!) 142/72  07/13/19 (!) 148/89   Wt Readings from Last 3 Encounters:  07/13/19 268 lb 15.4 oz (122 kg)  01/12/19 262 lb (118.8 kg)  07/11/18 262 lb 9.6 oz (119.1 kg)   Body mass index is 43.41 kg/m.   Physical Exam    Constitutional: Appears well-developed and well-nourished. No distress.  Head: Normocephalic and atraumatic.  Neck: Neck supple. No tracheal deviation present. No thyromegaly present.  No cervical lymphadenopathy Cardiovascular: Normal rate,  regular rhythm and normal heart sounds.  No murmur heard.  Pulmonary/Chest: Effort normal and breath sounds normal. No respiratory distress. No has no wheezes. No rales.  Skin: Skin is warm and dry. Not diaphoretic. Erythematous papules that are raised on b/l LEs and arms - no generalized erythema, , ulcers or open wounds, no fluctuance Psychiatric: Normal mood and affect. Behavior is normal.       Assessment & Plan:    See Problem List for Assessment and Plan of chronic medical problems.    This visit occurred during the SARS-CoV-2 public health emergency.  Safety protocols were in place, including screening questions prior to the visit, additional usage of staff PPE, and extensive cleaning of exam room while observing appropriate contact time as indicated for disinfecting solutions.

## 2019-09-18 NOTE — Patient Instructions (Addendum)
  Blood work was ordered.     Medications reviewed and updated.  Changes include :   Doxycycline, steroid cream and antinausea  Your prescription(s) have been submitted to your pharmacy. Please take as directed and contact our office if you believe you are having problem(s) with the medication(s).

## 2019-09-19 ENCOUNTER — Encounter: Payer: Self-pay | Admitting: Internal Medicine

## 2019-09-19 ENCOUNTER — Ambulatory Visit: Payer: Medicare Other

## 2019-09-19 LAB — CBC WITH DIFFERENTIAL/PLATELET
Basophils Absolute: 0.1 10*3/uL (ref 0.0–0.1)
Basophils Relative: 0.9 % (ref 0.0–3.0)
Eosinophils Absolute: 0.2 10*3/uL (ref 0.0–0.7)
Eosinophils Relative: 1.9 % (ref 0.0–5.0)
HCT: 38.4 % (ref 36.0–46.0)
Hemoglobin: 12.5 g/dL (ref 12.0–15.0)
Lymphocytes Relative: 24.7 % (ref 12.0–46.0)
Lymphs Abs: 2.3 10*3/uL (ref 0.7–4.0)
MCHC: 32.5 g/dL (ref 30.0–36.0)
MCV: 91.4 fl (ref 78.0–100.0)
Monocytes Absolute: 0.4 10*3/uL (ref 0.1–1.0)
Monocytes Relative: 4.9 % (ref 3.0–12.0)
Neutro Abs: 6.2 10*3/uL (ref 1.4–7.7)
Neutrophils Relative %: 67.6 % (ref 43.0–77.0)
Platelets: 275 10*3/uL (ref 150.0–400.0)
RBC: 4.2 Mil/uL (ref 3.87–5.11)
RDW: 19.9 % — ABNORMAL HIGH (ref 11.5–15.5)
WBC: 9.2 10*3/uL (ref 4.0–10.5)

## 2019-09-20 ENCOUNTER — Ambulatory Visit: Payer: Medicare Other | Admitting: Podiatry

## 2019-09-20 DIAGNOSIS — L308 Other specified dermatitis: Secondary | ICD-10-CM | POA: Diagnosis not present

## 2019-09-20 DIAGNOSIS — B354 Tinea corporis: Secondary | ICD-10-CM | POA: Diagnosis not present

## 2019-09-20 DIAGNOSIS — L853 Xerosis cutis: Secondary | ICD-10-CM | POA: Diagnosis not present

## 2019-09-20 DIAGNOSIS — L4059 Other psoriatic arthropathy: Secondary | ICD-10-CM | POA: Diagnosis not present

## 2019-09-20 DIAGNOSIS — D692 Other nonthrombocytopenic purpura: Secondary | ICD-10-CM | POA: Diagnosis not present

## 2019-09-20 DIAGNOSIS — L400 Psoriasis vulgaris: Secondary | ICD-10-CM | POA: Diagnosis not present

## 2019-09-20 DIAGNOSIS — L821 Other seborrheic keratosis: Secondary | ICD-10-CM | POA: Diagnosis not present

## 2019-09-20 DIAGNOSIS — L404 Guttate psoriasis: Secondary | ICD-10-CM | POA: Diagnosis not present

## 2019-09-20 DIAGNOSIS — L411 Pityriasis lichenoides chronica: Secondary | ICD-10-CM | POA: Diagnosis not present

## 2019-09-21 ENCOUNTER — Ambulatory Visit: Payer: Medicare Other

## 2019-09-22 DIAGNOSIS — E13621 Other specified diabetes mellitus with foot ulcer: Secondary | ICD-10-CM | POA: Diagnosis not present

## 2019-09-22 DIAGNOSIS — E1140 Type 2 diabetes mellitus with diabetic neuropathy, unspecified: Secondary | ICD-10-CM | POA: Diagnosis not present

## 2019-09-22 DIAGNOSIS — M19072 Primary osteoarthritis, left ankle and foot: Secondary | ICD-10-CM | POA: Diagnosis not present

## 2019-09-22 MED ORDER — TERBINAFINE HCL 250 MG PO TABS: 250.0000 mg | ORAL_TABLET | Freq: Every day | ORAL | Status: DC

## 2019-09-22 NOTE — Addendum Note (Signed)
Addended by: Binnie Rail on: 09/22/2019 07:52 AM   Modules accepted: Orders

## 2019-10-06 DIAGNOSIS — E13621 Other specified diabetes mellitus with foot ulcer: Secondary | ICD-10-CM | POA: Diagnosis not present

## 2019-10-07 DIAGNOSIS — Z23 Encounter for immunization: Secondary | ICD-10-CM | POA: Diagnosis not present

## 2019-10-12 ENCOUNTER — Ambulatory Visit: Payer: Medicare Other | Admitting: Cardiovascular Disease

## 2019-10-12 ENCOUNTER — Encounter: Payer: Self-pay | Admitting: Internal Medicine

## 2019-10-12 MED ORDER — FLUCONAZOLE 150 MG PO TABS: 150.0000 mg | ORAL_TABLET | Freq: Once | ORAL | 0 refills | Status: AC

## 2019-10-13 ENCOUNTER — Other Ambulatory Visit: Payer: Self-pay

## 2019-10-13 MED ORDER — LEVEMIR FLEXTOUCH 100 UNIT/ML ~~LOC~~ SOPN: 14.0000 [IU] | PEN_INJECTOR | Freq: Two times a day (BID) | SUBCUTANEOUS | 3 refills | Status: DC

## 2019-10-16 MED ORDER — FLUCONAZOLE 150 MG PO TABS: 150.0000 mg | ORAL_TABLET | Freq: Once | ORAL | 0 refills | Status: AC

## 2019-10-16 NOTE — Addendum Note (Signed)
Addended by: Binnie Rail on: 10/16/2019 09:02 PM   Modules accepted: Orders

## 2019-10-18 DIAGNOSIS — L4059 Other psoriatic arthropathy: Secondary | ICD-10-CM | POA: Diagnosis not present

## 2019-10-23 DIAGNOSIS — E13621 Other specified diabetes mellitus with foot ulcer: Secondary | ICD-10-CM | POA: Diagnosis not present

## 2019-10-24 ENCOUNTER — Other Ambulatory Visit: Payer: Self-pay

## 2019-10-24 MED ORDER — LEVEMIR FLEXTOUCH 100 UNIT/ML ~~LOC~~ SOPN: 14.0000 [IU] | PEN_INJECTOR | Freq: Two times a day (BID) | SUBCUTANEOUS | 0 refills | Status: DC

## 2019-10-24 MED ORDER — FLUCONAZOLE 150 MG PO TABS: 150.0000 mg | ORAL_TABLET | Freq: Once | ORAL | 0 refills | Status: AC

## 2019-10-24 NOTE — Addendum Note (Signed)
Addended by: Binnie Rail on: 10/24/2019 11:57 AM   Modules accepted: Orders

## 2019-10-25 ENCOUNTER — Telehealth: Payer: Self-pay

## 2019-10-25 MED ORDER — LANTUS SOLOSTAR 100 UNIT/ML ~~LOC~~ SOPN: 10.0000 [IU] | PEN_INJECTOR | Freq: Every day | SUBCUTANEOUS | 3 refills | Status: DC

## 2019-10-25 NOTE — Telephone Encounter (Signed)
Waiting on response for PA. Do you want to send in one alternative or wait on response?

## 2019-10-25 NOTE — Telephone Encounter (Signed)
New message    [facility] needs prior authorization on LEVEMIR FLEXTOUCH 100 UNIT/ML FlexPen

## 2019-10-25 NOTE — Telephone Encounter (Addendum)
Lantus sent to [facility].  Let patient know.  Dose will be slightly slower and we will increase as needed.  We will start at 10 units twice daily

## 2019-10-25 NOTE — Telephone Encounter (Signed)
F/u    [facility] calling    Alternative  on Lantus 3.ml /166ml  Or [place] 1.71ml 3s  300 ml flex pen   Will need new prescription.

## 2019-10-26 NOTE — Telephone Encounter (Signed)
F/u  Ref #  [id number]  Need to speak with the CMA regarding Lantus   It's list allergies trying to find out what type of allergies the patient has.

## 2019-10-26 NOTE — Telephone Encounter (Signed)
LVM to all back in regards.

## 2019-10-26 NOTE — Telephone Encounter (Signed)
Tried calling pt. She was in a rush and told me she would call me back in regards later. She did say levemir was approved.

## 2019-10-27 NOTE — Telephone Encounter (Signed)
F/u  [facility] calling back to speak with the CMA .

## 2019-10-30 NOTE — Telephone Encounter (Signed)
[employer] [facility] calling to follow up on Levemir, [phone]  Ref key : [facility]

## 2019-10-30 NOTE — Telephone Encounter (Signed)
Let [employer] know per patient and fax that I received from pts insurance, her levemir was approved.

## 2019-10-31 MED ORDER — FLUCONAZOLE 150 MG PO TABS: 150.0000 mg | ORAL_TABLET | Freq: Once | ORAL | 0 refills | Status: AC

## 2019-10-31 NOTE — Addendum Note (Signed)
Addended by: Binnie Rail on: 10/31/2019 01:14 PM   Modules accepted: Orders

## 2019-11-06 ENCOUNTER — Ambulatory Visit: Payer: Medicare Other | Admitting: Podiatry

## 2019-11-07 DIAGNOSIS — E13621 Other specified diabetes mellitus with foot ulcer: Secondary | ICD-10-CM | POA: Diagnosis not present

## 2019-11-12 DIAGNOSIS — E785 Hyperlipidemia, unspecified: Secondary | ICD-10-CM | POA: Insufficient documentation

## 2019-11-12 NOTE — Progress Notes (Deleted)
Subjective:    Patient ID: [name] [name], female    DOB: Sep 12, 1945, 74 y.o.   MRN: [id number]  HPI The patient is here for follow up of their chronic medical problems, including diabetes, hypertension, hyperlipidemia    Medications and allergies reviewed with patient and updated if appropriate.  Patient Active Problem List   Diagnosis Date Noted  . Pityriasis lichenoides chronica 09/18/2019  . Nausea 09/18/2019  . Insomnia 08/07/2019  . Anxiety 08/07/2019  . Psoriatic arthritis ([place]) 08/07/2019  . H/O: gout 08/07/2019  . Recurrent UTI 08/07/2019  . Charcot's joint of left foot 08/07/2019  . Depression 08/07/2019  . Pneumonia due to COVID-19 virus 07/09/2019  . Physical deconditioning 07/08/2019  . COVID-19 07/07/2019  . Diabetic peripheral neuropathy ([place]) 04/10/2019  . Pain in left knee 10/13/2018  . Ischemic colitis ([place]) 10/06/2018  . Arthritis 07/21/2018  . Urinary incontinence 07/21/2018  . Diabetic foot ulcer ([place]) 07/13/2018  . Cellulitis of toe of left foot 05/02/2018  . DM2 (diabetes mellitus, type 2) ([place]) 05/02/2018  . Psoriasis 05/02/2018  . Osteoarthritis of knee 10/18/2017  . Chest pain 01/12/2015  . Right bundle branch block 05/20/2014  . OSA (obstructive sleep apnea), BiPAP 03/12/2013  . Essential hypertension 03/12/2013  . Obesity 09/14/2012  . Hyponatremia 09/14/2012  . S/P right THA, AA 09/13/2012  . Deep venous thrombosis of lower extremity ([place]) 08/10/2010  . 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 acetaminophen (TYLENOL) 325 MG tablet Take 2 tablets (650 mg total) by mouth every 6 (six) hours as needed for mild pain or headache.    . allopurinol (ZYLOPRIM) 100 MG tablet Take 100 mg by mouth 2 (two) times daily.    Marland Kitchen aspirin 81 MG tablet Take 81 mg by mouth every other day.     . Budesonide (UCERIS) 9 MG TB24 Take 9 mg by mouth daily.     .  Certolizumab Pegol (CIMZIA ) Inject 400 mg into the skin every 30 (thirty) days. 200 mg on each side of stomach    . Cholecalciferol (VITAMIN D3) 1000 UNITS CAPS Take 1,000 Units by mouth 2 (two) times daily.     . citalopram (CELEXA) 40 MG tablet Take 40 mg by mouth daily.    . Cyanocobalamin (VITAMIN B-12) 1000 MCG SUBL Place 1,000 mcg under the tongue daily.    . cyclobenzaprine (FLEXERIL) 10 MG tablet Take 10 mg by mouth daily.     Marland Kitchen docusate sodium (COLACE) 100 MG capsule Take 1 capsule (100 mg total) by mouth 2 (two) times daily. 60 capsule 5  . folic acid (FOLVITE) 1 MG tablet Take 1 tablet (1 mg total) by mouth daily. 30 tablet 3  . insulin glargine (LANTUS SOLOSTAR) 100 UNIT/ML Solostar Pen Inject 10 Units into the skin daily. 5 pen 3  . lisinopril-hydrochlorothiazide (ZESTORETIC) 20-25 MG tablet Take 1 tablet by mouth daily.    Marland Kitchen loperamide (IMODIUM) 2 MG capsule Take 6 mg by mouth 2 (two) times daily.    . Melatonin 5 MG SUBL Place 5 mg under the tongue at bedtime.    . metFORMIN (GLUCOPHAGE-XR) 500 MG 24 hr tablet Take 500 mg by mouth 2 (two) times daily.     . methotrexate (RHEUMATREX) 2.5 MG tablet Take 8 tablets (20 mg total) by mouth once a week. Caution: Chemotherapy. Protect from light. Takes on Friday - hold for 2 weeks. 4  tablet 0  . mupirocin cream (BACTROBAN) 2 % Apply 1 application topically 2 (two) times daily as needed (psoriasis).     Marland Kitchen omeprazole (PRILOSEC) 40 MG capsule Take 40 mg by mouth at bedtime.    . potassium chloride (K-DUR) 10 MEQ tablet Take 20 mEq by mouth daily.    . Probiotic Product (ALIGN PO) Take 1 capsule by mouth daily.    . rosuvastatin (CRESTOR) 5 MG tablet Take 1 tablet (5 mg total) by mouth daily. 90 tablet 3  . terbinafine (LAMISIL) 250 MG tablet Take 1 tablet (250 mg total) by mouth daily.    Marland Kitchen triamcinolone cream (KENALOG) 0.1 % Apply 1 application topically 2 (two) times daily. 30 g 0   No current facility-administered medications on file  prior to visit.    Past Medical History:  Diagnosis Date  . Arthritis   . Closed fracture of fifth metatarsal bone 05/13/2018  . Deep vein thrombosis ([place])    right calf - 05/2012   . Diabetes mellitus without complication (HCC)    diet controlled   . GERD (gastroesophageal reflux disease)   . Hyperlipidemia   . Hypertension    Ejection fraction =>55% Left ventricular systolic function is normal. Left ventricular wall motion is normal    . Lymphocytic colitis   . Neuropathy    diabetic - in bilateral feet   . Pityriasis lichenoides chronica   . Sleep apnea    bipap    Past Surgical History:  Procedure Laterality Date  . DILATION AND CURETTAGE OF UTERUS    . HAMMER TOE SURGERY    . right hand surgery      due to blood infection   . torn meniscus repair      right knee   . TOTAL HIP ARTHROPLASTY  09/13/2012   Procedure: TOTAL HIP ARTHROPLASTY ANTERIOR APPROACH;  Surgeon: Mauri Pole, MD;  Location: [facility];  Service: Orthopedics;  Laterality: Right;    Social History   Socioeconomic History  . Marital status: Divorced    Spouse name: Not on file  . Number of children: Not on file  . Years of education: Not on file  . Highest education level: Not on file  Occupational History  . Not on file  Tobacco Use  . Smoking status: Never Smoker  . Smokeless tobacco: Never Used  Substance and Sexual Activity  . Alcohol use: Yes    Comment: occasional wine   . Drug use: No  . Sexual activity: Not on file  Other Topics Concern  . Not on file  Social History Narrative   She is widowed. She has 2 children, 2 grandchildren. She does drink occasional alcohol . There is no tobacco . She does not have a routine exercise program   Social Determinants of Health   Financial Resource Strain:   . Difficulty of Paying Living Expenses:   Food Insecurity:   . Worried About Charity fundraiser in the Last Year:   . Arboriculturist in the Last Year:   Transportation Needs:   . Consulting civil engineer (Medical):   Marland Kitchen Lack of Transportation (Non-Medical):   Physical Activity:   . Days of Exercise per Week:   . Minutes of Exercise per Session:   Stress:   . Feeling of Stress :   Social Connections:   . Frequency of Communication with Friends and Family:   . Frequency of Social Gatherings with Friends and Family:   .  Attends Religious Services:   . Active Member of Clubs or Organizations:   . Attends Archivist Meetings:   Marland Kitchen Marital Status:     Family History  Problem Relation Age of Onset  . Hypertension Mother     Review of Systems     Objective:  There were no vitals filed for this visit. BP Readings from Last 3 Encounters:  09/18/19 138/64  08/15/19 (!) 142/72  07/13/19 (!) 148/89   Wt Readings from Last 3 Encounters:  07/13/19 268 lb 15.4 oz (122 kg)  01/12/19 262 lb (118.8 kg)  07/11/18 262 lb 9.6 oz (119.1 kg)   There is no height or weight on file to calculate BMI.   Physical Exam    Constitutional: Appears well-developed and well-nourished. No distress.  HENT:  Head: Normocephalic and atraumatic.  Neck: Neck supple. No tracheal deviation present. No thyromegaly present.  No cervical lymphadenopathy Cardiovascular: Normal rate, regular rhythm and normal heart sounds.   No murmur heard. No carotid bruit .  No edema Pulmonary/Chest: Effort normal and breath sounds normal. No respiratory distress. No has no wheezes. No rales.  Skin: Skin is warm and dry. Not diaphoretic.  Psychiatric: Normal mood and affect. Behavior is normal.      Assessment & Plan:    See Problem List for Assessment and Plan of chronic medical problems.    This visit occurred during the SARS-CoV-2 public health emergency.  Safety protocols were in place, including screening questions prior to the visit, additional usage of staff PPE, and extensive cleaning of exam room while observing appropriate contact time as indicated for disinfecting solutions.

## 2019-11-13 ENCOUNTER — Ambulatory Visit (INDEPENDENT_AMBULATORY_CARE_PROVIDER_SITE_OTHER): Payer: Medicare Other | Admitting: Internal Medicine

## 2019-11-13 ENCOUNTER — Encounter: Payer: Self-pay | Admitting: Internal Medicine

## 2019-11-13 ENCOUNTER — Other Ambulatory Visit: Payer: Self-pay

## 2019-11-13 DIAGNOSIS — E785 Hyperlipidemia, unspecified: Secondary | ICD-10-CM

## 2019-11-13 DIAGNOSIS — E1169 Type 2 diabetes mellitus with other specified complication: Secondary | ICD-10-CM | POA: Diagnosis not present

## 2019-11-13 DIAGNOSIS — I10 Essential (primary) hypertension: Secondary | ICD-10-CM | POA: Diagnosis not present

## 2019-11-13 DIAGNOSIS — R5383 Other fatigue: Secondary | ICD-10-CM

## 2019-11-13 MED ORDER — FOLIC ACID 1 MG PO TABS: 1.0000 mg | ORAL_TABLET | Freq: Every day | ORAL | 1 refills | Status: DC

## 2019-11-13 MED ORDER — POTASSIUM CHLORIDE CRYS ER 10 MEQ PO TBCR: 20.0000 meq | EXTENDED_RELEASE_TABLET | Freq: Every day | ORAL | 1 refills | Status: DC

## 2019-11-13 MED ORDER — ALPRAZOLAM 0.25 MG PO TABS: 0.2500 mg | ORAL_TABLET | Freq: Two times a day (BID) | ORAL | 0 refills | Status: DC | PRN

## 2019-11-13 MED ORDER — FLUTICASONE PROPIONATE 50 MCG/ACT NA SUSP: 2.0000 | Freq: Every day | NASAL | 6 refills | Status: DC

## 2019-11-13 NOTE — Assessment & Plan Note (Signed)
Having intermittent fatigue-does not seem to be daily, but some days she does not feel well Some of this may be residual from Covid, which she had a few months ago Some of this may be secondary to elevated sugars-she agrees to start checking her sugar We will check CBC, CMP

## 2019-11-13 NOTE — Assessment & Plan Note (Signed)
BP Readings from Last 3 Encounters:  09/18/19 138/64  08/15/19 (!) 142/72  07/13/19 (!) 148/89   Last blood pressure reading was controlled Continue current medication at current doses CMP

## 2019-11-13 NOTE — Progress Notes (Signed)
Virtual Visit via Video Note  I connected with [name] [name] on 11/13/19 at  3:15 PM EDT by a video enabled telemedicine application and verified that I am speaking with the correct person using two identifiers.   I discussed the limitations of evaluation and management by telemedicine and the availability of in person appointments. The patient expressed understanding and agreed to proceed.  Present for the visit:  Myself, Dr [name], Miquel Dunn.  The patient is currently at home and I am in the office.    No referring provider.    History of Present Illness: The patient is here for follow up of their chronic medical problems, including diabetes, hypertension, hyperlipidemia   She is having a lot of allergies.  She is taking allegra daily.  She does not use a nasal spray, but has taken 1 in the past.  This did help, but did irritate her nasal passageways after using for several days.  She still does not have taste or smell since having Covid.  Her daughter sent her an article about retraining her olfactory system and she plans on trying that.  Has strange reactions to food.  She has hiccups.  She can not chew meat.  Some days she feels terrible and then for some days she feels ok.  She has no energy some days.    Her foot is still an issue.  She was finally able to get rid of the cast, but now needs new orthotics.  She is following with ortho.  She is still been an active because of the foot.  Increased anxiety - in past took Xanax as needed, but she only took 2 in a year.  Just having on hand seemed to help the anxiety.  She was wondering if she could have something in case she needs it.  Sugars - has not checked them.  She has been eating more carbs than she should.  She is afraid to check her sugars because she knows they will be high.  She is taking all her medication as prescribed.   She takes [id number], vitamin d, folic acid and vitamin c.   Review of Systems  Constitutional:  Positive for malaise/fatigue. Negative for fever.  HENT:       Pnd  Eyes:       Watery eyes  Respiratory: Positive for cough (allergy related). Negative for shortness of breath and wheezing.   Cardiovascular: Negative for chest pain, palpitations and leg swelling.  Neurological: Positive for headaches (allergy related).     Social History   Socioeconomic History  . Marital status: Divorced    Spouse name: Not on file  . Number of children: Not on file  . Years of education: Not on file  . Highest education level: Not on file  Occupational History  . Not on file  Tobacco Use  . Smoking status: Never Smoker  . Smokeless tobacco: Never Used  Substance and Sexual Activity  . Alcohol use: Yes    Comment: occasional wine   . Drug use: No  . Sexual activity: Not on file  Other Topics Concern  . Not on file  Social History Narrative   She is widowed. She has 2 children, 2 grandchildren. She does drink occasional alcohol . There is no tobacco . She does not have a routine exercise program   Social Determinants of Health   Financial Resource Strain:   . Difficulty of Paying Living Expenses:   Food Insecurity:   .  Worried About Charity fundraiser in the Last Year:   . Arboriculturist in the Last Year:   Transportation Needs:   . Film/video editor (Medical):   Marland Kitchen Lack of Transportation (Non-Medical):   Physical Activity:   . Days of Exercise per Week:   . Minutes of Exercise per Session:   Stress:   . Feeling of Stress :   Social Connections:   . Frequency of Communication with Friends and Family:   . Frequency of Social Gatherings with Friends and Family:   . Attends Religious Services:   . Active Member of Clubs or Organizations:   . Attends Archivist Meetings:   Marland Kitchen Marital Status:      Observations/Objective: Appears well in NAD Breathing normally, speaking full sentences Skin appears warm and dry  Assessment and Plan:  See Problem List for  Assessment and Plan of chronic medical problems.   Follow Up Instructions:    I discussed the assessment and treatment plan with the patient. The patient was provided an opportunity to ask questions and all were answered. The patient agreed with the plan and demonstrated an understanding of the instructions.   The patient was advised to call back or seek an in-person evaluation if the symptoms worsen or if the condition fails to improve as anticipated.    Binnie Rail, MD

## 2019-11-13 NOTE — Assessment & Plan Note (Signed)
Chronic She is taking the medication, but not checking her sugars and they were uncontrolled and my guess is they still are Discussed that she needs to check her sugar so we can adjust her insulin Discussed that this could be one of the reasons she is not feeling well if her sugars are elevated She agrees to come in for an A1c next week, but she will start checking her sugar and update me via MyChart as we can adjust her insulin

## 2019-11-13 NOTE — Assessment & Plan Note (Signed)
Chronic Continue Crestor 5 mg Check lipid panel, CMP

## 2019-11-14 ENCOUNTER — Telehealth: Payer: Self-pay

## 2019-11-14 ENCOUNTER — Telehealth: Payer: Medicare Other | Admitting: Cardiovascular Disease

## 2019-11-14 NOTE — Telephone Encounter (Signed)
-----   Message from Binnie Rail, MD sent at 11/13/2019  4:36 PM EDT ----- I just had a virtual visit with her.  I would like to see her in the office for 3 months follow-up in July.  Thank you.

## 2019-11-14 NOTE — Telephone Encounter (Signed)
LVM for patient to call back and schedule a 3 month follow up.

## 2019-11-15 DIAGNOSIS — L4059 Other psoriatic arthropathy: Secondary | ICD-10-CM | POA: Diagnosis not present

## 2019-11-17 ENCOUNTER — Encounter: Payer: Self-pay | Admitting: Internal Medicine

## 2019-11-24 DIAGNOSIS — L97521 Non-pressure chronic ulcer of other part of left foot limited to breakdown of skin: Secondary | ICD-10-CM | POA: Diagnosis not present

## 2019-11-26 MED ORDER — LANTUS SOLOSTAR 100 UNIT/ML ~~LOC~~ SOPN: 14.0000 [IU] | PEN_INJECTOR | Freq: Every day | SUBCUTANEOUS | 3 refills | Status: DC

## 2019-11-29 MED ORDER — LANTUS SOLOSTAR 100 UNIT/ML ~~LOC~~ SOPN: 18.0000 [IU] | PEN_INJECTOR | Freq: Every day | SUBCUTANEOUS | 3 refills | Status: DC

## 2019-11-29 NOTE — Addendum Note (Signed)
Addended by: Binnie Rail on: 11/29/2019 01:04 PM   Modules accepted: Orders

## 2019-12-07 ENCOUNTER — Other Ambulatory Visit: Payer: Self-pay | Admitting: Internal Medicine

## 2019-12-07 DIAGNOSIS — Z6839 Body mass index (BMI) 39.0-39.9, adult: Secondary | ICD-10-CM | POA: Diagnosis not present

## 2019-12-07 DIAGNOSIS — L4059 Other psoriatic arthropathy: Secondary | ICD-10-CM | POA: Diagnosis not present

## 2019-12-07 DIAGNOSIS — L408 Other psoriasis: Secondary | ICD-10-CM | POA: Diagnosis not present

## 2019-12-07 DIAGNOSIS — K5289 Other specified noninfective gastroenteritis and colitis: Secondary | ICD-10-CM | POA: Diagnosis not present

## 2019-12-07 DIAGNOSIS — E669 Obesity, unspecified: Secondary | ICD-10-CM | POA: Diagnosis not present

## 2019-12-07 DIAGNOSIS — Z79899 Other long term (current) drug therapy: Secondary | ICD-10-CM | POA: Diagnosis not present

## 2019-12-07 DIAGNOSIS — M1009 Idiopathic gout, multiple sites: Secondary | ICD-10-CM | POA: Diagnosis not present

## 2019-12-11 ENCOUNTER — Encounter: Payer: Self-pay | Admitting: Internal Medicine

## 2019-12-12 MED ORDER — METFORMIN HCL ER 500 MG PO TB24: 500.0000 mg | ORAL_TABLET | Freq: Two times a day (BID) | ORAL | 1 refills | Status: DC

## 2019-12-14 DIAGNOSIS — L4059 Other psoriatic arthropathy: Secondary | ICD-10-CM | POA: Diagnosis not present

## 2019-12-15 DIAGNOSIS — L97521 Non-pressure chronic ulcer of other part of left foot limited to breakdown of skin: Secondary | ICD-10-CM | POA: Diagnosis not present

## 2019-12-21 ENCOUNTER — Other Ambulatory Visit (INDEPENDENT_AMBULATORY_CARE_PROVIDER_SITE_OTHER): Payer: Medicare Other

## 2019-12-21 DIAGNOSIS — E785 Hyperlipidemia, unspecified: Secondary | ICD-10-CM | POA: Diagnosis not present

## 2019-12-21 DIAGNOSIS — E1169 Type 2 diabetes mellitus with other specified complication: Secondary | ICD-10-CM | POA: Diagnosis not present

## 2019-12-21 DIAGNOSIS — I10 Essential (primary) hypertension: Secondary | ICD-10-CM

## 2019-12-21 LAB — COMPREHENSIVE METABOLIC PANEL
ALT: 28 U/L (ref 0–35)
AST: 23 U/L (ref 0–37)
Albumin: 3.7 g/dL (ref 3.5–5.2)
Alkaline Phosphatase: 51 U/L (ref 39–117)
BUN: 19 mg/dL (ref 6–23)
CO2: 32 mEq/L (ref 19–32)
Calcium: 9.6 mg/dL (ref 8.4–10.5)
Chloride: 94 mEq/L — ABNORMAL LOW (ref 96–112)
Creatinine, Ser: 0.75 mg/dL (ref 0.40–1.20)
GFR: 75.46 mL/min (ref >60.00)
Glucose, Bld: 148 mg/dL — ABNORMAL HIGH (ref 70–99)
Potassium: 3.9 mEq/L (ref 3.5–5.1)
Sodium: 134 mEq/L — ABNORMAL LOW (ref 135–145)
Total Bilirubin: 0.4 mg/dL (ref 0.2–1.2)
Total Protein: 6.4 g/dL (ref 6.0–8.3)

## 2019-12-21 LAB — LIPID PANEL
Cholesterol: 116 mg/dL (ref 0–200)
HDL: 49.1 mg/dL (ref >39.00)
LDL Cholesterol: 40 mg/dL (ref 0–99)
NonHDL: 67.17
Total CHOL/HDL Ratio: 2
Triglycerides: 134 mg/dL (ref 0.0–149.0)
VLDL: 26.8 mg/dL (ref 0.0–40.0)

## 2019-12-21 LAB — HEMOGLOBIN A1C: Hgb A1c MFr Bld: 9 % — ABNORMAL HIGH (ref 4.6–6.5)

## 2019-12-22 ENCOUNTER — Telehealth: Payer: Self-pay | Admitting: Internal Medicine

## 2019-12-22 NOTE — Telephone Encounter (Signed)
    Pleas return call to patient to discuss lab results

## 2019-12-24 ENCOUNTER — Encounter: Payer: Self-pay | Admitting: Internal Medicine

## 2019-12-25 NOTE — Telephone Encounter (Signed)
Pt aware of results 

## 2019-12-26 MED ORDER — AZELASTINE HCL 0.1 % NA SOLN: 2.0000 | Freq: Two times a day (BID) | NASAL | 12 refills | Status: DC

## 2020-01-01 ENCOUNTER — Encounter: Payer: Self-pay | Admitting: Internal Medicine

## 2020-01-05 DIAGNOSIS — L97521 Non-pressure chronic ulcer of other part of left foot limited to breakdown of skin: Secondary | ICD-10-CM | POA: Diagnosis not present

## 2020-01-05 MED ORDER — METFORMIN HCL ER 500 MG PO TB24: 1000.0000 mg | ORAL_TABLET | Freq: Two times a day (BID) | ORAL | 1 refills | Status: DC

## 2020-01-05 MED ORDER — LANTUS SOLOSTAR 100 UNIT/ML ~~LOC~~ SOPN: 18.0000 [IU] | PEN_INJECTOR | Freq: Every day | SUBCUTANEOUS | 3 refills | Status: DC

## 2020-01-06 ENCOUNTER — Encounter: Payer: Self-pay | Admitting: Internal Medicine

## 2020-01-10 ENCOUNTER — Encounter: Payer: Self-pay | Admitting: Internal Medicine

## 2020-01-11 DIAGNOSIS — L4059 Other psoriatic arthropathy: Secondary | ICD-10-CM | POA: Diagnosis not present

## 2020-01-11 DIAGNOSIS — Z79899 Other long term (current) drug therapy: Secondary | ICD-10-CM | POA: Diagnosis not present

## 2020-01-12 ENCOUNTER — Telehealth (INDEPENDENT_AMBULATORY_CARE_PROVIDER_SITE_OTHER): Payer: Medicare Other | Admitting: Cardiovascular Disease

## 2020-01-12 VITALS — BP 128/75 | Ht 66.0 in | Wt 245.0 lb

## 2020-01-12 DIAGNOSIS — E118 Type 2 diabetes mellitus with unspecified complications: Secondary | ICD-10-CM | POA: Diagnosis not present

## 2020-01-12 DIAGNOSIS — G4733 Obstructive sleep apnea (adult) (pediatric): Secondary | ICD-10-CM

## 2020-01-12 DIAGNOSIS — I10 Essential (primary) hypertension: Secondary | ICD-10-CM | POA: Diagnosis not present

## 2020-01-12 DIAGNOSIS — I4510 Unspecified right bundle-branch block: Secondary | ICD-10-CM

## 2020-01-12 DIAGNOSIS — E785 Hyperlipidemia, unspecified: Secondary | ICD-10-CM

## 2020-01-12 NOTE — Patient Instructions (Addendum)
Medication Instructions:  CONTINUE WITH CURRENT MEDICATIONS. NO CHANGES.  *If you need a refill on your cardiac medications before your next appointment, please call your pharmacy*    Testing/Procedures: 1 month Your physician has requested that you have an echocardiogram. Echocardiography is a painless test that uses sound waves to create images of your heart. It provides your doctor with information about the size and shape of your heart and how well your heart's chambers and valves are working. This procedure takes approximately one hour. There are no restrictions for this procedure.  [place]: At [facility], you and your health needs are our priority.  As part of our continuing mission to provide you with exceptional heart care, we have created designated Provider Care Teams.  These Care Teams include your primary Cardiologist (physician) and Advanced Practice Providers (APPs -  Physician Assistants and Nurse Practitioners) who all work together to provide you with the care you need, when you need it.  We recommend signing up for the patient portal called "MyChart".  Sign up information is provided on this After Visit Summary.  MyChart is used to connect with patients for Virtual Visits (Telemedicine).  Patients are able to view lab/test results, encounter notes, upcoming appointments, etc.  Non-urgent messages can be sent to your provider as well.   To learn more about what you can do with MyChart, go to [email or web address].    Your next appointment:   3 month(s)  The format for your next appointment:   In Person  Provider:   Shelva Majestic, MD

## 2020-01-14 ENCOUNTER — Encounter: Payer: Self-pay | Admitting: Cardiovascular Disease

## 2020-01-14 NOTE — Progress Notes (Signed)
Virtual Visit via Video Note   This visit type was conducted due to national recommendations for restrictions regarding the COVID-19 Pandemic (e.g. social distancing) in an effort to limit this patient's exposure and mitigate transmission in our community.  Due to her co-morbid illnesses, this patient is at least at moderate risk for complications without adequate follow up.  This format is felt to be most appropriate for this patient at this time.  All issues noted in this document were discussed and addressed.  A limited physical exam was performed with this format.  Please refer to the patient's chart for her consent to telehealth for [facility].   The patient was identified using 2 identifiers.  Date:  01/14/2020   ID:  [name] [name], DOB 07/11/1946, MRN [id number]  Patient Location: Home Provider Location: Office  PCP:  Binnie Rail, MD  Cardiologist:  Shelva Majestic, MD  Electrophysiologist:  None   Evaluation Performed:  Follow-Up Visit  Chief Complaint:  18 month F/U  History of Present Illness:    [name] [name] is a 74 y.o. female who presents for this telemedicine visit for cardiology/sleep evaluation.  I last saw her in December 2019.  Ms. [name] is a retired [employer]  who has a history of hypertension, obesity, psoriatic arthritis, DVT, which occurred while traveling to [place], as well as complex obstructive sleep apnea.  She has had difficulty in the past with CPAP therapy. She has been able to tolerate BiPAP and has had a [employer]ics BiPAP Auto unit well but also has had difficulty with some of her high pressure requirements. In April 2014 I changed her maximum BiPAP pressure to 19 and her maximum EPAP pressure to 15. I also reduced her minimum EPAP pressure to 6 and her minimal IPAP pressure to 10 with pressure support of 4 and changed her Bi_Flex to 3. She has been followed by [facility].  Clinically, she had felt markedly improved with BiPAP.  Compared to prior  CPAP therapy.  She has psoriatic arthritis and is on Humira as well as methotrexate followed by rheumatology.  She also has a Charcot joint in her right foot.   She is on Cymbalta for pain relief relative to this. She does not routinely exercise.  She keeps busy moving around, but typically does not do aerobic activity.  She has not been successful with significant weight loss.   She recently, she has been taking lisinopril HCTZ 20/12.5 mg for hypertension.  She had experienced an episode of chest discomfort which she felt was like a cramp and she noticed this most when standing up, but was associated with diaphoresis and shortness of breath.  She does have family history for coronary artery disease both in her mother as well as in her maternal grandmother. She has not been able to be very active due to her Charcot foot.  She also had noticed some mild shortness of breath with activity.  She eventually was referred for a nuclear perfusion study on 01/31/2015 which revealed normal perfusion and function; ejection fraction 64%.  When I last saw her in February 2017, her BiPAP machine had begun to fail.  She continues to use CPAP with 100% compliance and cannot sleep without it.  She typically sleeps for at least 8 hours per night.  She had taken her BiPAP machine to [place] who stated that her machine essentially had stopped working and is in need for new machine.   She received a new Respironics Dream station  auto BiPAP unit in April 2017.  She uses a Customer service manager FX full face mask, medium size.  She feels that the machine has been working well but she has had difficulty with her sleep.  At times she feels that she is not getting enough pressure.  Previously she had been set on a BiPAP auto unit with an EPAP minimum of 8 and IPAP max of 19 , which had worked well.  I obtained a new download today from 02/16/2016 through 03/16/2016.  She is 100% compliant and is averaging 8 hours and 25 minutes of sleep.   Apparently, she is not been set on auto and has been on and IPAP of 14 and an EPAP of 10.  Her AHI is increased at 25.1. I changed her BiPAP mode from a fixed pressure to auto BiPAP initiating atenolol over 6 with potential titration up to 20/16.  I also allowed for pressure support to change from 4-6 as needed.  IN 2018, she felt well from a cardiac perspective, but has had significant infections including MRSA, strep throat, UTI, and she had fractured her right ankle.  As result, she has noticed some ankle swelling right greater than left.  She continues to use her BiPAP and believes she is feeling much better than she had in the past.  [name] is her DME company.  A download in the office from 01/26/2017 through 04/25/2017.  She is 100% compliance.  She is averaging 8 hours and 36 minutes of sleep per night.  Her average device IPAP pressure was 18.2 and average device CPAP pressure greater than 90% of the time was 14.1.  AHI continued to be elevated at 21.2. An Epworth scale score was calculated in the office today and this endorsed at 4, arguing against daytime sleepiness.  I last saw her, she was sleeping adequate duration.  Her AHI remains elevated I suggested that she reduce ramp time from 30 minutes down to 20 minutes and increased her starting pressure from 4 up to 6 cm.  I saw her in September 2019 at which time she had again started to notice  symptoms where she was waking up at night.  She noted leaks from her mask at higher pressures.  She feels that she is more  tired.  She had some swelling right lower extremity greater than left and she has a Charcot right foot.  She had broken her right ankle in August 2018.  She denied any chest pain.  She is unaware of any cardiac arrhythmias.  Her daughter, [name] is a GI physician who had been practicing in [facility] and will be moving up to work with [employer].  She is grateful that she will now have her twin  granddaughters close by.  She was recently placed on Cimzia for her psoriatic arthritis to take in addition to her methotrexate.  She is followed by [name] for her rheumatologic issues.   When I evaluated her in December 2019 she was,sleeping 8 hours 29 minutes and her average IPAP pressure greater than 90% 16.7 with an average EPAP pressure of 12.4.  I reduced her ramp time down to 20 minutes.    She was having issues with her mask and I recommended a trial of the new DreamWear full facemask by either a [employer] or a [employer].  Over the past several months, she has not been able to be active due to her fracture of her fifth metatarsal.  She was  in a hard cast for 2 months with no weightbearing.  As result she has not been able to exercise.  She has gained weight.g.  She continues to use her BiPAP.  A new download was obtained and she again is compliant.  Her average device IPAP pressure greater than 90% of the time was 18.1 with a maximum titrated IPAP pressure of 20.  AHI remained elevated at 17.9.  She apparently went to [place] in January 2020 and unfortunately became ill and spent the entire trip in the hospital due to ischemic colitis.  She was subsequently evaluated by Fabian Sharp in a telemedicine visit in June 2020.  Presently, she states that she has not been able to do much walking because of tenderness from her bone on the plantar aspect of her foot.  She has fallen arches.  She is being evaluated by Dr. Doran Durand.  Around Thanksgiving 2020 both she and her husband both became ill with COVID-19 pneumonia and both were hospitalized for approximately a week.  She had temperatures of 103, she was treated with remdesivir, convalescent plasma in addition to steroids.  He has subsequently not regained her sense of taste or smell and has continued to experience fatigability.  During that time she was not able to use BiPAP.  Apparently she is now having issues stating that Medicare is going to  deny her BiPAP.  She has continued to use BiPAP therapy.  I attempted to try to get a download from her [employer] website today but we were unable to do this in the office.  I have reached out to Barry Brunner who will contact [employer] to see if we can obtain her most recent download.  Ms. Berrios continues to be on lisinopril/HCTZ for hypertension, rosuvastatin 5 mg for hyperlipidemia.  She was on methotrexate for rheumatoid arthritis but this was stopped approximately 6 weeks ago.  She is diabetic on metformin.  She presents for evaluation.  The patient does not have symptoms concerning for COVID-19 infection (fever, chills, cough, or new shortness of breath).    Past Medical History:  Diagnosis Date  . Arthritis   . Closed fracture of fifth metatarsal bone 05/13/2018  . Deep vein thrombosis ([place])    right calf - 05/2012   . Diabetes mellitus without complication (HCC)    diet controlled   . GERD (gastroesophageal reflux disease)   . Hyperlipidemia   . Hypertension    Ejection fraction =>55% Left ventricular systolic function is normal. Left ventricular wall motion is normal    . Lymphocytic colitis   . Neuropathy    diabetic - in bilateral feet   . Pityriasis lichenoides chronica   . Sleep apnea    bipap   Past Surgical History:  Procedure Laterality Date  . DILATION AND CURETTAGE OF UTERUS    . HAMMER TOE SURGERY    . right hand surgery      due to blood infection   . torn meniscus repair      right knee   . TOTAL HIP ARTHROPLASTY  09/13/2012   Procedure: TOTAL HIP ARTHROPLASTY ANTERIOR APPROACH;  Surgeon: Mauri Pole, MD;  Location: [facility];  Service: Orthopedics;  Laterality: Right;     Current Meds  Medication Sig  . acetaminophen (TYLENOL) 325 MG tablet Take 2 tablets (650 mg total) by mouth every 6 (six) hours as needed for mild pain or headache.  . allopurinol (ZYLOPRIM) 100 MG tablet Take 100 mg by mouth 2 (two) times  daily.  Marland Kitchen ALPRAZolam (XANAX) 0.25 MG tablet Take  1 tablet (0.25 mg total) by mouth 2 (two) times daily as needed for anxiety.  Marland Kitchen aspirin 81 MG tablet Take 81 mg by mouth every other day.   Marland Kitchen azelastine (ASTELIN) 0.1 % nasal spray Place 2 sprays into both nostrils 2 (two) times daily. Use in each nostril as directed  . Budesonide (UCERIS) 9 MG TB24 Take 9 mg by mouth daily.   . Certolizumab Pegol (CIMZIA [place]) Inject 400 mg into the skin every 30 (thirty) days. 200 mg on each side of stomach  . Cholecalciferol (VITAMIN D3) 1000 UNITS CAPS Take 1,000 Units by mouth 2 (two) times daily.   . citalopram (CELEXA) 40 MG tablet Take 40 mg by mouth daily.  . Cyanocobalamin (VITAMIN B-12) 1000 MCG SUBL Place 1,000 mcg under the tongue daily.  . cyclobenzaprine (FLEXERIL) 10 MG tablet TAKE 1 TABLET BY MOUTH AT BEDTIME  . docusate sodium (COLACE) 100 MG capsule Take 1 capsule (100 mg total) by mouth 2 (two) times daily.  . fluticasone (FLONASE) 50 MCG/ACT nasal spray Place 2 sprays into both nostrils daily.  . folic acid (FOLVITE) 1 MG tablet Take 1 tablet (1 mg total) by mouth daily.  Marland Kitchen LEVEMIR FLEXTOUCH 100 UNIT/ML FlexPen 18 Units.  Marland Kitchen lisinopril-hydrochlorothiazide (ZESTORETIC) 20-25 MG tablet Take 1 tablet by mouth daily.  Marland Kitchen loperamide (IMODIUM) 2 MG capsule Take 6 mg by mouth 2 (two) times daily.  . Melatonin 5 MG SUBL Place 5 mg under the tongue at bedtime.  . metFORMIN (GLUCOPHAGE-XR) 500 MG 24 hr tablet Take 2 tablets (1,000 mg total) by mouth 2 (two) times daily.  . mupirocin cream (BACTROBAN) 2 % Apply 1 application topically 2 (two) times daily as needed (psoriasis).   Marland Kitchen omeprazole (PRILOSEC) 40 MG capsule Take 40 mg by mouth at bedtime.  . potassium chloride (KLOR-CON) 10 MEQ tablet Take 2 tablets (20 mEq total) by mouth daily.  . Probiotic Product (ALIGN PO) Take 1 capsule by mouth daily.  . rosuvastatin (CRESTOR) 5 MG tablet Take 1 tablet (5 mg total) by mouth daily.  Marland Kitchen terbinafine (LAMISIL) 250 MG tablet Take 1 tablet (250 mg total) by mouth  daily.  Marland Kitchen triamcinolone cream (KENALOG) 0.1 % Apply 1 application topically 2 (two) times daily.     Allergies:   Other, Bee venom, Farxiga [dapagliflozin], and Sulfa antibiotics   Social History   Tobacco Use  . Smoking status: Never Smoker  . Smokeless tobacco: Never Used  Substance Use Topics  . Alcohol use: Yes    Comment: occasional wine   . Drug use: No     Family Hx: The patient's family history includes Hypertension in her mother.  ROS:   Please see the history of present illness.    Positive for COVID-19 pneumonia in November 2020, hospitalized for 1 week, treated with random severe, convalescent plasma and steroids.  Continues to have loss of taste and smell No chest pain No wheezing No palpitations Ischemic colitis hospitalized in [place] pain with fallen arches unable to walk due to bone comfort; Charcot joint OSA on BiPAP  All other systems reviewed and are negative.   Prior CV studies:   The following studies were reviewed today:  I reviewed the patient's records from [place], [place].  Recent laboratory was reviewed.  Labs/Other Tests and Data Reviewed:    EKG: No ECG was done today.  ECG from July 07, 2019 when hospitalized with Covid showed  sinus tachycardia at 102 bpm.  Right bundle branch block with repolarization changes.  Recent Labs: 07/07/2019: B Natriuretic Peptide 47.0; TSH 1.124 07/11/2019: Magnesium 1.9 09/18/2019: Hemoglobin 12.5; Platelets 275.0 12/21/2019: ALT 28; BUN 19; Creatinine, Ser 0.75; Potassium 3.9; Sodium 134   Recent Lipid Panel Lab Results  Component Value Date/Time   CHOL 116 12/21/2019 11:36 AM   TRIG 134.0 12/21/2019 11:36 AM   HDL 49.10 12/21/2019 11:36 AM   CHOLHDL 2 12/21/2019 11:36 AM   LDLCALC 40 12/21/2019 11:36 AM   LDLDIRECT 139.0 08/15/2019 11:14 AM    Wt Readings from Last 3 Encounters:  01/12/20 245 lb (111.1 kg)  07/13/19 268 lb 15.4 oz (122 kg)  01/12/19 262 lb (118.8 kg)     Objective:     Vital Signs:  BP 128/75   Ht 5\' 6"  (1.676 m)   Wt 245 lb (111.1 kg)   BMI 39.54 kg/m    I cannot physically examine the patient is a video telemedicine visit Breathing was nonlabored without wheezing There did not appear to be any obvious JVD Neck was thick No chest wall pain Heart rhythm regular by palpation No apparent chest pain or abdominal tenderness Positive for foot pain Continues to use BiPAP sleeping adequately.  ASSESSMENT & PLAN:    1. OSA on BiPAP: The patient states that she has been told that Medicare may no longer cover her treatment.  A peel is in place.  I do not understand this.  The patient has severe sleep apnea and in the past had significant difficulty with CPAP and has been able to tolerate BiPAP.  In the past she had difficulty with high pressures.  She has been on BiPAP therapy for over 7 years.  We will try to obtain a download of her DreamStation auto BiPAP unit from [employer].  In the past, she did not tolerate exceedingly high pressures.  She has continued to have residual AHI elevation but a prior download in 2019 showed 95th percentile pressure at 16.7/12.4.  2. COVID-19 pneumonia: I reviewed records of her hospitalization which occurred around Thanksgiving 2020.  She received treatment with read them severe, convalescent plasma and steroids.  Peak temperature was 103.  CRP was 16.4.  She continues to experience fatigability and has not regained her sense of taste or smell.  With her Covid infection, I am recommending she undergo a follow-up echo Doppler study to make certain there is no delayed reduction in LV function/myocarditis. 3. Essential hypertension: Presently is on lisinopril HCT 20/25 mg.  Target blood pressures less than 130/80.  Blood pressure today was 128/75. 4. Obesity: She is borderline morbidly obese with a BMI of 39.5.  Weight loss was recommended. 5. Type 2 diabetes mellitus: She continues to be on Levemir, metformin  therapy. 6. Hyperlipidemia: Currently on rosuvastatin 5 mg.  Target LDL less than 70 in this diabetic female. 7. History of psoriatic arthritis : No longer on methotrexate 8. Ischemic colitis with large GI bleed leading to hospitalization while on her vacation in [place], [place] 9. Remote history of DVT which occurred while traveling to [place].  COVID-19 Education: The signs and symptoms of COVID-19 were discussed with the patient and how to seek care for testing (follow up with PCP or arrange E-visit).  The importance of social distancing was discussed today.  Time:   Today, I have spent 25 minutes with the patient with telehealth technology discussing the above problems.     Medication Adjustments/Labs and Tests Ordered: Current  medicines are reviewed at length with the patient today.  Concerns regarding medicines are outlined above.   Tests Ordered: Orders Placed This Encounter  Procedures  . ECHOCARDIOGRAM COMPLETE    Medication Changes: No orders of the defined types were placed in this encounter.   Follow Up: The patient will undergo 2D echo Doppler evaluation.  Follow-up  visit in the office in 3 months  Signed, Shelva Majestic, MD  01/14/2020 7:30 PM    [facility]

## 2020-01-17 ENCOUNTER — Encounter (HOSPITAL_BASED_OUTPATIENT_CLINIC_OR_DEPARTMENT_OTHER): Payer: Medicare Other | Attending: Physician Assistant | Admitting: Physician Assistant

## 2020-01-17 ENCOUNTER — Other Ambulatory Visit (HOSPITAL_COMMUNITY)
Admission: RE | Admit: 2020-01-17 | Discharge: 2020-01-17 | Disposition: A | Discharge status: Home / Self Care | Payer: Medicare Other | Source: Other Acute Inpatient Hospital | Attending: Physician Assistant | Admitting: Physician Assistant

## 2020-01-17 ENCOUNTER — Other Ambulatory Visit: Payer: Self-pay

## 2020-01-17 DIAGNOSIS — E11621 Type 2 diabetes mellitus with foot ulcer: Secondary | ICD-10-CM | POA: Diagnosis not present

## 2020-01-17 DIAGNOSIS — E785 Hyperlipidemia, unspecified: Secondary | ICD-10-CM | POA: Diagnosis not present

## 2020-01-17 DIAGNOSIS — M109 Gout, unspecified: Secondary | ICD-10-CM | POA: Insufficient documentation

## 2020-01-17 DIAGNOSIS — L97522 Non-pressure chronic ulcer of other part of left foot with fat layer exposed: Secondary | ICD-10-CM | POA: Diagnosis not present

## 2020-01-17 DIAGNOSIS — Z809 Family history of malignant neoplasm, unspecified: Secondary | ICD-10-CM | POA: Insufficient documentation

## 2020-01-17 DIAGNOSIS — L089 Local infection of the skin and subcutaneous tissue, unspecified: Secondary | ICD-10-CM | POA: Insufficient documentation

## 2020-01-17 DIAGNOSIS — E1140 Type 2 diabetes mellitus with diabetic neuropathy, unspecified: Secondary | ICD-10-CM | POA: Diagnosis not present

## 2020-01-17 DIAGNOSIS — Z882 Allergy status to sulfonamides status: Secondary | ICD-10-CM | POA: Insufficient documentation

## 2020-01-17 DIAGNOSIS — Z8249 Family history of ischemic heart disease and other diseases of the circulatory system: Secondary | ICD-10-CM | POA: Insufficient documentation

## 2020-01-17 DIAGNOSIS — E11610 Type 2 diabetes mellitus with diabetic neuropathic arthropathy: Secondary | ICD-10-CM | POA: Diagnosis not present

## 2020-01-17 DIAGNOSIS — Z823 Family history of stroke: Secondary | ICD-10-CM | POA: Diagnosis not present

## 2020-01-17 DIAGNOSIS — Z91030 Bee allergy status: Secondary | ICD-10-CM | POA: Insufficient documentation

## 2020-01-17 DIAGNOSIS — I10 Essential (primary) hypertension: Secondary | ICD-10-CM | POA: Diagnosis not present

## 2020-01-17 DIAGNOSIS — M1990 Unspecified osteoarthritis, unspecified site: Secondary | ICD-10-CM | POA: Diagnosis not present

## 2020-01-18 NOTE — Progress Notes (Signed)
[name] [name], [name] [name] ([id number]) Visit Report for 01/17/2020 Abuse/Suicide Risk Screen Details Patient Name: Date of Service: [name] [name], [name] E. 01/17/2020 10:30 A M Medical Record Number: [id number] Patient Account Number: [id number] Date of Birth/Sex: Treating RN: 17-Apr-1946 (74 y.o. [name] Primary Care Kalina Morabito: [name] Other Clinician: Referring Azharia Surratt: Treating Onya Eutsler/Extender: Landis Martins Weeks in Treatment: 0 Abuse/Suicide Risk Screen Items Answer ABUSE RISK SCREEN: Has anyone close to you tried to hurt or harm you recentlyo No Do you feel uncomfortable with anyone in your familyo No Has anyone forced you do things that you didnt want to doo No Electronic Signature(s) Signed: 01/18/2020 5:18:16 PM By: Levan Hurst RN, BSN Entered By: Levan Hurst on 01/17/2020 11:17:44 -------------------------------------------------------------------------------- Activities of Daily Living Details Patient Name: Date of Service: [name] [name], [name] E. 01/17/2020 10:30 A M Medical Record Number: [id number] Patient Account Number: [id number] Date of Birth/Sex: Treating RN: 04-12-1946 (74 y.o. [name] Primary Care Crescencio Jozwiak: [name] Other Clinician: Referring Promise Weldin: Treating Cedrik Heindl/Extender: Landis Martins Weeks in Treatment: 0 Activities of Daily Living Items Answer Activities of Daily Living (Please select one for each item) Drive Automobile Completely Able T Medications ake Completely Able Use T elephone Completely Able Care for Appearance Completely Able Use T oilet Completely Able Bath / Shower Completely Able Dress Self Completely Able Feed Self Completely Able Walk Completely Able Get In / Out Bed Completely Able Housework Completely Able Prepare Meals Completely [place] for Self Completely Able Electronic Signature(s) Signed: 01/18/2020 5:18:16 PM By: Levan Hurst RN, BSN Entered  By: Levan Hurst on 01/17/2020 11:18:07 -------------------------------------------------------------------------------- Education Screening Details Patient Name: Date of Service: [name] [name], [name] [place] RBA RA E. 01/17/2020 10:30 A M Medical Record Number: [id number] Patient Account Number: [id number] Date of Birth/Sex: Treating RN: April 06, 1946 (74 y.o. [name] Primary Care Tammala Weider: [name] Other Clinician: Referring Annalena Piatt: Treating Latunya Kissick/Extender: Adele Schilder in Treatment: 0 Primary Learner Assessed: Patient Learning Preferences/Education Level/Primary Language Learning Preference: Explanation, Demonstration, Printed Material Highest Education Level: College or Above Preferred Language: English Cognitive Barrier Language Barrier: No Translator Needed: No Memory Deficit: No Emotional Barrier: No Cultural/Religious Beliefs Affecting Medical Care: No Physical Barrier Impaired Vision: No Impaired Hearing: No Decreased Hand dexterity: No Knowledge/Comprehension Knowledge Level: High Comprehension Level: High Ability to understand written instructions: High Ability to understand verbal instructions: High Motivation Anxiety Level: Calm Cooperation: Cooperative Education Importance: Acknowledges Need Interest in Health Problems: Asks Questions Perception: Coherent Willingness to Engage in Self-Management High Activities: Readiness to Engage in Self-Management High Activities: Electronic Signature(s) Signed: 01/18/2020 5:18:16 PM By: Levan Hurst RN, BSN Entered By: Levan Hurst on 01/17/2020 11:18:27 -------------------------------------------------------------------------------- Fall Risk Assessment Details Patient Name: Date of Service: [name] [name], [name] [place] RBA RA E. 01/17/2020 10:30 A M Medical Record Number: [id number] Patient Account Number: [id number] Date of Birth/Sex: Treating RN: 1946-06-28 (74 y.o. [name] Primary Care Kemani Heidel:  [name] Other Clinician: Referring Rece Zechman: Treating Zyshawn Bohnenkamp/Extender: Adele Schilder in Treatment: 0 Fall Risk Assessment Items Have you had 2 or more falls in [name] last 12 monthso 0 No Have you had any fall that resulted in injury in [name] last 12 monthso 0 No FALLS RISK SCREEN History of falling - immediate or within 3 months 25 Yes Secondary diagnosis (Do you have 2 or more medical diagnoseso) 15 Yes Ambulatory aid None/bed rest/wheelchair/nurse 0 No Crutches/cane/walker 15 Yes Furniture 0 No Intravenous therapy Access/Saline/Heparin [employer]  0 No Gait/Transferring Normal/ bed rest/ wheelchair 0 Yes Weak (short steps with or without shuffle, stooped but able to lift head while walking, may seek 0 No support from furniture) Impaired (short steps with shuffle, may have difficulty arising from chair, head down, impaired 0 No balance) Mental Status Oriented to own ability 0 Yes Electronic Signature(s) Signed: 01/18/2020 5:18:16 PM By: Levan Hurst RN, BSN Entered By: Levan Hurst on 01/17/2020 11:18:59 -------------------------------------------------------------------------------- Foot Assessment Details Patient Name: Date of Service: [name] [name], [name] RA E. 01/17/2020 10:30 A M Medical Record Number: [id number] Patient Account Number: [id number] Date of Birth/Sex: Treating RN: [birth year] (74 y.o. [name] Primary Care Niccole Witthuhn: [name] Other Clinician: Referring Takira Sherrin: Treating Amanie Mcculley/Extender: Landis Martins Weeks in Treatment: 0 Foot Assessment Items Site Locations + = Sensation present, - = Sensation absent, C = Callus, U = Ulcer R = Redness, W = Warmth, M = Maceration, PU = Pre-ulcerative lesion F = Fissure, S = Swelling, D = Dryness Assessment Right: Left: Other Deformity: No No Prior Foot Ulcer: No No Prior Amputation: No No Charcot Joint: No No Ambulatory Status: Ambulatory With Help Assistance Device:  Cane Gait: Steady Electronic Signature(s) Signed: 01/18/2020 5:18:16 PM By: Levan Hurst RN, BSN Entered By: Levan Hurst on 01/17/2020 11:20:07 -------------------------------------------------------------------------------- Nutrition Risk Screening Details Patient Name: Date of Service: [name] [name], [name] [place] RBA RA E. 01/17/2020 10:30 A M Medical Record Number: [id number] Patient Account Number: [id number] Date of Birth/Sex: Treating RN: February 14, 1946 (74 y.o. [name] Primary Care Gracelyn Coventry: [name] Other Clinician: Referring Kailani Brass: Treating Remon Quinto/Extender: Landis Martins Weeks in Treatment: 0 Height (in): 66 Weight (lbs): 245 Body Mass Index (BMI): 39.5 Nutrition Risk Screening Items Score Screening NUTRITION RISK SCREEN: I have an illness or condition that made me change [name] kind and/or amount of food I eat 2 Yes I eat fewer than two meals per day 0 No I eat few fruits and vegetables, or milk products 0 No I have three or more drinks of beer, liquor or wine almost every day 0 No I have tooth or mouth problems that make it hard for me to eat 0 No I don't always have enough money to buy [name] food I need 0 No I eat alone most of [name] time 0 No I take three or more different prescribed or over-[name]-counter drugs a day 1 Yes Without wanting to, I have lost or gained 10 pounds in [name] last six months 0 No I am not always physically able to shop, cook and/or feed myself 0 No Nutrition Protocols Good Risk Protocol Moderate Risk Protocol 0 Provide education on nutrition High Risk Proctocol Risk Level: Moderate Risk Score: 3 Electronic Signature(s) Signed: 01/18/2020 5:18:16 PM By: Levan Hurst RN, BSN Entered By: Levan Hurst on 01/17/2020 11:19:35

## 2020-01-18 NOTE — Progress Notes (Addendum)
KACY, HEGNA ([id number]) Visit Report for 01/17/2020 Chief Complaint Document Details Patient Name: Date of Service: [name] [name], [name] E. 01/17/2020 10:30 A M Medical Record Number: [id number] Patient Account Number: [id number] Date of Birth/Sex: Treating RN: 06-Jan-1946 (74 y.o. [name] Other Clinician: Referring Provider: Treating Provider/Extender: Landis Martins Weeks in Treatment: 0 Information Obtained from: Patient Chief Complaint Left foot ulcer Electronic Signature(s) Signed: 01/17/2020 3:46:31 PM By: Worthy Keeler PA-C Entered By: Worthy Keeler on 01/17/2020 15:46:30 -------------------------------------------------------------------------------- Debridement Details Patient Name: Date of Service: [name] Amato RA E. 01/17/2020 10:30 A M Medical Record Number: [id number] Patient Account Number: [id number] Date of Birth/Sex: Treating RN: 09/25/1945 (74 y.o. [name] Other Clinician: Referring Provider: Treating Provider/Extender: Landis Martins Weeks in Treatment: 0 Debridement Performed for Assessment: Wound #1 Left,Medial,Plantar Foot Performed By: Physician Worthy Keeler, PA Debridement Type: Debridement Severity of Tissue Pre Debridement: Fat layer exposed Level of Consciousness (Pre-procedure): Awake and Alert Pre-procedure Verification/Time Out Yes - 11:50 Taken: Start Time: 11:51 Pain Control: Lidocaine 4% T opical Solution T Area Debrided (L x W): otal 2 (cm) x 2.3 (cm) = 4.6 (cm) Tissue and other material debrided: Viable, Non-Viable, Callus, Slough, Subcutaneous, Skin: Epidermis, Slough Level: Skin/Subcutaneous Tissue Debridement Description: Excisional Instrument: Curette Specimen: Swab, Number of Specimens T aken: 1 Bleeding: Minimum Hemostasis Achieved: Pressure End Time: 11:59 Procedural Pain: 0 Post Procedural Pain: 0 Response  to Treatment: Procedure was tolerated well Level of Consciousness (Post- Awake and Alert procedure): Post Debridement Measurements of Total Wound Length: (cm) 1.3 Width: (cm) 1 Depth: (cm) 0.4 Volume: (cm) 0.408 Character of Wound/Ulcer Post Debridement: Improved Severity of Tissue Post Debridement: Fat layer exposed Post Procedure Diagnosis Same as Pre-procedure Electronic Signature(s) Signed: 01/17/2020 6:08:32 PM By: [name] RN, BSN Signed: 01/17/2020 6:29:31 PM By: Worthy Keeler PA-C Entered By: [name] on 01/17/2020 11:57:11 -------------------------------------------------------------------------------- HPI Details Patient Name: Date of Service: [name] [name], [name] RA E. 01/17/2020 10:30 A M Medical Record Number: [id number] Patient Account Number: [id number] Date of Birth/Sex: Treating RN: [birth year] (74 y.o. [name] Other Clinician: Referring Provider: Treating Provider/Extender: Adele Schilder in Treatment: 0 History of Present Illness HPI Description: 01/17/2020 upon evaluation today patient presents for initial evaluation here in our clinic concerning issues she has been having with a left medial/plantar foot ulcer. This is actually been an issue for her since October 2020. She has been seeing Dr. Doran Durand for quite some time during that course. Fortunately there is no signs of active infection at this time. Or least no mention of this to have seen in general. With that being said unfortunately I do see some signs of erythema noted today that does have me concerned about the possibility of infection at this point in the surrounding area of the wound. There is also a warm to touch at the site which is somewhat concerning. Fortunately there is no evidence of systemic infection which is great news. The patient does have a history of diabetes mellitus type 2, Charcot foot which is what led to the wound, and  hypertension. She notes that she was in a cast for some time with Dr. Doran Durand for about 8 weeks. During that time they were utilizing according to the patient silver nitrate along with a foam doughnut and then Coban to secure in place in the cast  in place. With that being said I do not have the actual records to review we are going to try to get a hold of those unfortunately they would not flow over into care everywhere I did look today. She has been seeing Dr. Doran Durand and his physician assistant Larkin Ina up until the end of May and apparently is still seeing them on a regular basis every 2 weeks roughly. She has also tried Iodosorb without effect here. Electronic Signature(s) Signed: 01/17/2020 3:48:51 PM By: Worthy Keeler PA-C Entered By: Worthy Keeler on 01/17/2020 15:48:51 -------------------------------------------------------------------------------- Physical Exam Details Patient Name: Date of Service: [name] [name], [name] [place] RBA RA E. 01/17/2020 10:30 A M Medical Record Number: [id number] Patient Account Number: [id number] Date of Birth/Sex: Treating RN: January 13, 1946 (74 y.o. [name] Other Clinician: Referring Provider: Treating Provider/Extender: Landis Martins Weeks in Treatment: 0 Constitutional sitting or standing blood pressure is within target range for patient.. pulse regular and within target range for patient.Marland Kitchen respirations regular, non-labored and within target range for patient.Marland Kitchen temperature within target range for patient.. Well-nourished and well-hydrated in no acute distress. Eyes conjunctiva clear no eyelid edema noted. pupils equal round and reactive to light and accommodation. Ears, Nose, Mouth, and Throat no gross abnormality of ear auricles or external auditory canals. normal hearing noted during conversation. mucus membranes moist. Respiratory normal breathing without difficulty. Cardiovascular 2+ dorsalis pedis/posterior  tibialis pulses. no clubbing, cyanosis, significant edema, <3 sec cap refill. Musculoskeletal normal gait and posture. The patient does have Charcot foot of the left foot.Marland Kitchen Psychiatric this patient is able to make decisions and demonstrates good insight into disease process. Alert and Oriented x 3. pleasant and cooperative. Notes Upon inspection patient's wound bed actually showed signs of significant callus buildup around the edges of the wound unfortunately. I do believe this is going require fairly significant debridement today. I was able to actually pare away the callus and clean the surface of the wound which had somewhat nonviable or rather poorly viable tissue noted on the surface of the wound. She actually tolerated the debridement today without complication and post debridement the wound bed appears to be doing much better. With that being said the name of the game here is good to be trying to keep pressure off of the area where her foot has collapsed where the wound is in order to allow this to heal and then subsequently keep it healed following. Electronic Signature(s) Signed: 01/17/2020 3:49:53 PM By: Worthy Keeler PA-C Entered By: Worthy Keeler on 01/17/2020 15:49:53 -------------------------------------------------------------------------------- Physician Orders Details Patient Name: Date of Service: [name] [name], [name] [place] RBA RA E. 01/17/2020 10:30 A M Medical Record Number: [id number] Patient Account Number: [id number] Date of Birth/Sex: Treating RN: June 19, 1946 (74 y.o. 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 Dressing Change Frequency Wound #1 Left,Medial,Plantar Foot Change Dressing every other day. Wound Cleansing Wound #1 Left,Medial,Plantar Foot May shower and wash wound with soap and water. Primary Wound Dressing Wound #1 Left,Medial,Plantar Foot Hydrofera Blue - classic moistened with saline  Secondary Dressing Wound #1 Left,Medial,Plantar Foot Foam - foam donut Dry Gauze Edema Control Avoid standing for long periods of time Elevate legs to the level of the heart or above for 30 minutes daily and/or when sitting, a frequency of: Off-Loading Removable cast walker boot to: - left foot (pt has at home) Laboratory naerobe culture (MICRO) - left plantar foot Bacteria identified in Unspecified specimen by A LOINC Code: 096-2 Convenience Name: Anerobic culture Patient Medications llergies: bee venom protein (honey bee), Sulfa (Sulfonamide Antibiotics), Farxiga A Notifications Medication Indication Start End 01/22/2020 Augmentin DOSE 1 - oral 875 mg-125 mg tablet - 1 tablet oral taken 2 times per day for 14 days Electronic Signature(s) Signed: 01/22/2020 5:55:02 PM By: Worthy Keeler PA-C Previous Signature: 01/17/2020 6:08:32 PM Version By: [name] RN, BSN Previous Signature: 01/17/2020 6:29:31 PM Version By: Worthy Keeler PA-C Entered By: Worthy Keeler on 01/22/2020 17:55:02 -------------------------------------------------------------------------------- Problem List Details Patient Name: Date of Service: [name] Amato RA E. 01/17/2020 10:30 A M Medical Record Number: [id number] Patient Account Number: [id number] Date of Birth/Sex: Treating RN: 1946-02-20 (74 y.o. [name] Other Clinician: Referring Provider: Treating Provider/Extender: Landis Martins Weeks in Treatment: 0 Active Problems ICD-10 Encounter Code Description Active Date MDM Diagnosis E11.621 Type 2 diabetes mellitus with foot ulcer 01/17/2020 No Yes L97.522 Non-pressure chronic ulcer of other part of left foot with fat layer exposed 01/17/2020 No Yes M14.672 Charcot's joint, left ankle and foot 01/17/2020 No Yes E11.40 Type 2 diabetes mellitus with diabetic neuropathy, unspecified 01/17/2020 No Yes I10 Essential (primary) hypertension 01/17/2020 No Yes Inactive Problems Resolved Problems Electronic Signature(s) Signed: 01/17/2020 11:40:27 AM By: Worthy Keeler PA-C Entered By: Worthy Keeler on 01/17/2020 11:40:27 -------------------------------------------------------------------------------- Progress Note Details Patient Name: Date of Service: [name] [name], [name] RA E. 01/17/2020 10:30 A M Medical Record Number: [id number] Patient Account Number: [id number] Date of Birth/Sex: Treating RN: [birth year] (74 y.o. [name] Other Clinician: Referring Provider: Treating Provider/Extender: Landis Martins Weeks in Treatment: 0 Subjective Chief Complaint Information obtained from Patient Left foot ulcer History of Present Illness (HPI) 01/17/2020 upon evaluation today patient presents for initial evaluation here in our clinic concerning issues she has been having with a left medial/plantar foot ulcer. This is actually been an issue for her since October 2020. She has been seeing Dr. Doran Durand for quite some time during that course. Fortunately there is no signs of active infection at this time. Or least no mention of this to have seen in general. With that being said unfortunately I do see some signs of erythema noted today that does have me concerned about the possibility of infection at this point in the surrounding area of the wound. There is also a warm to touch at the site which is somewhat concerning. Fortunately there is no  evidence of systemic infection which is great news. The patient does have a history of diabetes mellitus type 2, Charcot foot which is what led to the wound, and hypertension. She notes that she was in a cast for some time with Dr. Doran Durand for about 8 weeks. During that time they were utilizing according to the patient silver nitrate along with a foam doughnut and then Coban to secure in place in the cast in place. With that being said I do not have the actual records to review we are going to try to get a hold  of those unfortunately they would not flow over into care everywhere I did look today. She has been seeing Dr. Doran Durand and his physician assistant Larkin Ina up until the end of May and apparently is still seeing them on a regular basis every 2 weeks roughly. She has also tried Iodosorb without effect here. Patient History Information obtained from Patient. Allergies bee venom protein (honey bee) (Severity: Moderate, Reaction: swelling), Farxiga (Reaction: yeast infection), Sulfa (Sulfonamide Antibiotics) (Severity: Moderate, Reaction: hives) Family History Cancer - Father, Heart Disease - Mother, Hypertension - Mother, Stroke - Father,Mother, No family history of Diabetes, Hereditary Spherocytosis, Kidney Disease, Lung Disease, Seizures, Thyroid Problems, Tuberculosis. Social History Never smoker, Marital Status - Single, Alcohol Use - Rarely, Drug Use - No History, Caffeine Use - Daily - coffee. Medical History Respiratory Patient has history of Sleep Apnea - bipap Cardiovascular Patient has history of Deep Vein Thrombosis - right calf, Hypertension Gastrointestinal Patient has history of Colitis Endocrine Patient has history of Type II Diabetes Musculoskeletal Patient has history of Gout, Osteoarthritis Neurologic Patient has history of Neuropathy Patient is treated with Insulin, Oral Agents. Blood sugar is not tested. Medical A Surgical History  Notes nd Cardiovascular Hyperlipidemia Integumentary (Skin) Psoriasis Musculoskeletal Charcot foot Review of Systems (ROS) Constitutional Symptoms (General Health) Denies complaints or symptoms of Fatigue, Fever, Chills, Marked Weight Change. Eyes Complains or has symptoms of Glasses / Contacts - glasses. Denies complaints or symptoms of Dry Eyes, Vision Changes. Ear/Nose/Mouth/Throat Denies complaints or symptoms of Chronic sinus problems or rhinitis. Genitourinary Denies complaints or symptoms of Frequent urination. Integumentary (Skin) Complains or has symptoms of Wounds - wound on left foot. Psychiatric Denies complaints or symptoms of Claustrophobia, Suicidal. Objective Constitutional sitting or standing blood pressure is within target range for patient.. pulse regular and within target range for patient.Marland Kitchen respirations regular, non-labored and within target range for patient.Marland Kitchen temperature within target range for patient.. Well-nourished and well-hydrated in no acute distress. Vitals Time Taken: 10:57 AM, Height: 66 in, Source: Stated, Weight: 245 lbs, Source: Stated, BMI: 39.5, Temperature: 98.7 F, Pulse: 97 bpm, Respiratory Rate: 18 breaths/min, Blood Pressure: 122/76 mmHg. Eyes conjunctiva clear no eyelid edema noted. pupils equal round and reactive to light and accommodation. Ears, Nose, Mouth, and Throat no gross abnormality of ear auricles or external auditory canals. normal hearing noted during conversation. mucus membranes moist. Respiratory normal breathing without difficulty. Cardiovascular 2+ dorsalis pedis/posterior tibialis pulses. no clubbing, cyanosis, significant edema, Musculoskeletal normal gait and posture. The patient does have Charcot foot of the left foot.Marland Kitchen Psychiatric this patient is able to make decisions and demonstrates good insight into disease process. Alert and Oriented x 3. pleasant and cooperative. General Notes: Upon inspection patient's  wound bed actually showed signs of significant callus buildup around the edges of the wound unfortunately. I do believe this is going require fairly significant debridement today. I was able to actually pare away the callus and clean the surface of the wound which had somewhat nonviable or rather poorly viable tissue noted on the surface of the wound. She actually tolerated the debridement today without complication and post debridement the wound bed appears to be doing much better. With that being said the name of the game here is good to be trying to keep pressure off of the area where her foot has collapsed where the wound is in order to allow this to heal and then subsequently keep it healed following. Integumentary (Hair, Skin) Wound #1 status is Open. Original cause of wound was  Gradually Appeared. The wound is located on the [place]. The wound measures 1.3cm length x 1cm width x 0.4cm depth; 1.021cm^2 area and 0.408cm^3 volume. There is Fat Layer (Subcutaneous Tissue) Exposed exposed. There is no tunneling or undermining noted. There is a medium amount of serosanguineous drainage noted. The wound margin is thickened. There is large (67-100%) pink granulation within the wound bed. There is a small (1-33%) amount of necrotic tissue within the wound bed including Adherent Slough. Assessment Active Problems ICD-10 Type 2 diabetes mellitus with foot ulcer Non-pressure chronic ulcer of other part of left foot with fat layer exposed Charcot's joint, left ankle and foot Type 2 diabetes mellitus with diabetic neuropathy, unspecified Essential (primary) hypertension Procedures Wound #1 Pre-procedure diagnosis of Wound #1 is a Diabetic Wound/Ulcer of the Lower Extremity located on the Left,Medial,Plantar Foot .Severity of Tissue Pre Debridement is: Fat layer exposed. There was a Excisional Skin/Subcutaneous Tissue Debridement with a total area of 4.6 sq cm performed by Worthy Keeler, PA. With the following instrument(s): Curette to remove Viable and Non-Viable tissue/material. Material removed includes Callus, Subcutaneous Tissue, Slough, and Skin: Epidermis after achieving pain control using Lidocaine 4% T opical Solution. 1 specimen was taken by a Swab and sent to the lab per facility protocol. A time out was conducted at 11:50, prior to the start of the procedure. A Minimum amount of bleeding was controlled with Pressure. The procedure was tolerated well with a pain level of 0 throughout and a pain level of 0 following the procedure. Post Debridement Measurements: 1.3cm length x 1cm width x 0.4cm depth; 0.408cm^3 volume. Character of Wound/Ulcer Post Debridement is improved. Severity of Tissue Post Debridement is: Fat layer exposed. Post procedure Diagnosis Wound #1: Same as Pre-Procedure Plan Follow-up Appointments: Return Appointment in 1 week. Dressing Change Frequency: Wound #1 Left,Medial,Plantar Foot: Change Dressing every other day. Wound Cleansing: Wound #1 Left,Medial,Plantar Foot: May shower and wash wound with soap and water. Primary Wound Dressing: Wound #1 Left,Medial,Plantar Foot: Hydrofera Blue - classic moistened with saline Secondary Dressing: Wound #1 Left,Medial,Plantar Foot: Foam - foam donut Dry Gauze Edema Control: Avoid standing for long periods of time Elevate legs to the level of the heart or above for 30 minutes daily and/or when sitting, a frequency of: Off-Loading: Removable cast walker boot to: - left foot (pt has at home) Laboratory ordered were: Anerobic culture - left plantar foot The following medication(s) was prescribed: Augmentin oral 875 mg-125 mg tablet 1 1 tablet oral taken 2 times per day for 14 days starting 01/22/2020 1. I would recommend currently that we actually go ahead and initiate treatment with for the time being a foam doughnut along with Hydrofera Blue centrally packed into the base of the wound.  Hopefully this will help with offloading and the Hydrofera Blue will help to stay in contact with the wound bed and allow this to heal more effectively and quickly. 2. Also going to suggest that we have her use 1 or for walking boots that she was given orthopedic, recommend the longer one that actually goes up to just below the knee as I think this can keep things more stable. She has this at home. 3. I am also going to suggest a culture which was obtained today of the foot due to erythema if we notice anything on the culture I will initiate antibiotic therapy as needed. 4. With regard to the total contact cast I think that we may want to consider total contact cast  again using Hydrofera Blue under this in order to try to get this to heal. With that being said as I discussed with the patient this is something that we definitely would want to wait until she gets back from vacation regarding and again she is leaving for vacation on the 20th and will not be back to the 27th. Therefore we have a little bit of time for that will be reinitiated. We will see patient back for reevaluation in 1 week here in the clinic. If anything worsens or changes patient will contact our office for additional recommendations. Addendum: 01/22/2020 I reviewed the patient's culture today and noted that the patient was positive for Staph aureus. For that reason I did go ahead and send in a prescription for Augmentin 875/125 in order to get her antibiotic started I will see her Wednesday and see how we are doing at that point. Electronic Signature(s) Signed: 01/22/2020 5:56:00 PM By: Worthy Keeler PA-C Previous Signature: 01/17/2020 3:51:28 PM Version By: Worthy Keeler PA-C Entered By: Worthy Keeler on 01/22/2020 17:56:00 -------------------------------------------------------------------------------- HxROS Details Patient Name: Date of Service: [name] [name], [name] RA E. 01/17/2020 10:30 A M Medical Record Number:  [id number] Patient Account Number: [id number] Date of Birth/Sex: Treating RN: [birth year] (74 y.o. [name] Primary Care Provider: [name] Other Clinician: Referring Provider: Treating Provider/Extender: Landis Martins Weeks in Treatment: 0 Information Obtained From Patient Constitutional Symptoms (General Health) Complaints and Symptoms: Negative for: Fatigue; Fever; Chills; Marked Weight Change Eyes Complaints and Symptoms: Positive for: Glasses / Contacts - glasses Negative for: Dry Eyes; Vision Changes Ear/Nose/Mouth/Throat Complaints and Symptoms: Negative for: Chronic sinus problems or rhinitis Genitourinary Complaints and Symptoms: Negative for: Frequent urination Integumentary (Skin) Complaints and Symptoms: Positive for: Wounds - wound on left foot Medical History: Past Medical History Notes: Psoriasis Psychiatric Complaints and Symptoms: Negative for: Claustrophobia; Suicidal Hematologic/Lymphatic Respiratory Medical History: Positive for: Sleep Apnea - bipap Cardiovascular Medical History: Positive for: Deep Vein Thrombosis - right calf; Hypertension Past Medical History Notes: Hyperlipidemia Gastrointestinal Medical History: Positive for: Colitis Endocrine Medical History: Positive for: Type II Diabetes Time with diabetes: 2 years Treated with: Insulin, Oral agents Blood sugar tested every day: No Immunological Musculoskeletal Medical History: Positive for: Gout; Osteoarthritis Past Medical History Notes: Charcot foot Neurologic Medical History: Positive for: Neuropathy Oncologic Immunizations Pneumococcal Vaccine: Received Pneumococcal Vaccination: Yes Implantable Devices None Family and Social History Cancer: Yes - Father; Diabetes: No; Heart Disease: Yes - Mother; Hereditary Spherocytosis: No; Hypertension: Yes - Mother; Kidney Disease: No; Lung Disease: No; Seizures: No; Stroke: Yes - Father,Mother; Thyroid  Problems: No; Tuberculosis: No; Never smoker; Marital Status - Single; Alcohol Use: Rarely; Drug Use: No History; Caffeine Use: Daily - coffee; Financial Concerns: No; Food, Clothing or Shelter Needs: No; Support System Lacking: No; Transportation Concerns: No Electronic Signature(s) Signed: 01/17/2020 6:29:31 PM By: Worthy Keeler PA-C Signed: 01/18/2020 5:18:16 PM By: Levan Hurst RN, BSN Entered By: Levan Hurst on 01/17/2020 11:16:53 -------------------------------------------------------------------------------- Union Details Patient Name: Date of Service: [name] [name], [name] RA E. 01/17/2020 Medical Record Number: [id number] Patient Account Number: [id number] Date of Birth/Sex: Treating RN: [birth year] (74 y.o. [name] Other Clinician: Referring Provider: Treating Provider/Extender: Landis Martins Weeks in Treatment: 0 Diagnosis Coding ICD-10 Codes Code Description [phone] Type 2 diabetes mellitus with foot ulcer L97.522 Non-pressure chronic ulcer of other part of left foot with fat layer exposed M14.672 Charcot's joint, left ankle and  foot E11.40 Type 2 diabetes mellitus with diabetic neuropathy, unspecified I10 Essential (primary) hypertension Facility Procedures CPT4 Code: 20100712 Description: 19758 - WOUND CARE VISIT-LEV 3 EST PT Modifier: 25 Quantity: 1 CPT4 Code: 83254982 Description: 64158 - DEB SUBQ TISSUE 20 SQ CM/< ICD-10 Diagnosis Description L97.522 Non-pressure chronic ulcer of other part of left foot with fat layer exposed Modifier: Quantity: 1 Physician Procedures : CPT4 Code Description Modifier [id number] 99204 - WC PHYS LEVEL 4 - NEW PT 25 ICD-10 Diagnosis Description E11.621 Type 2 diabetes mellitus with foot ulcer L97.522 Non-pressure chronic ulcer of other part of left foot with fat layer exposed M14.672  Charcot's joint, left ankle and foot E11.40 Type 2 diabetes mellitus with diabetic  neuropathy, unspecified Quantity: 1 : [id number] 31594 - WC PHYS SUBQ TISS 20 SQ CM ICD-10 Diagnosis Description L97.522 Non-pressure chronic ulcer of other part of left foot with fat layer exposed Quantity: 1 Electronic Signature(s) Signed: 01/17/2020 3:51:43 PM By: Worthy Keeler PA-C Entered By: Worthy Keeler on 01/17/2020 15:51:42

## 2020-01-20 LAB — AEROBIC CULTURE W GRAM STAIN (SUPERFICIAL SPECIMEN)

## 2020-01-22 ENCOUNTER — Encounter: Payer: Self-pay | Admitting: Internal Medicine

## 2020-01-23 ENCOUNTER — Ambulatory Visit: Payer: Medicare Other | Admitting: Podiatry

## 2020-01-23 ENCOUNTER — Encounter: Payer: Self-pay | Admitting: Internal Medicine

## 2020-01-23 MED ORDER — BD PEN NEEDLE MINI U/F 31G X 5 MM MISC: 3 refills | Status: DC

## 2020-01-24 ENCOUNTER — Other Ambulatory Visit: Payer: Self-pay

## 2020-01-24 ENCOUNTER — Encounter (HOSPITAL_BASED_OUTPATIENT_CLINIC_OR_DEPARTMENT_OTHER): Payer: Medicare Other | Admitting: Physician Assistant

## 2020-01-24 DIAGNOSIS — I10 Essential (primary) hypertension: Secondary | ICD-10-CM | POA: Diagnosis not present

## 2020-01-24 DIAGNOSIS — M109 Gout, unspecified: Secondary | ICD-10-CM | POA: Diagnosis not present

## 2020-01-24 DIAGNOSIS — E785 Hyperlipidemia, unspecified: Secondary | ICD-10-CM | POA: Diagnosis not present

## 2020-01-24 DIAGNOSIS — L97522 Non-pressure chronic ulcer of other part of left foot with fat layer exposed: Secondary | ICD-10-CM | POA: Diagnosis not present

## 2020-01-24 DIAGNOSIS — E11610 Type 2 diabetes mellitus with diabetic neuropathic arthropathy: Secondary | ICD-10-CM | POA: Diagnosis not present

## 2020-01-24 DIAGNOSIS — E11621 Type 2 diabetes mellitus with foot ulcer: Secondary | ICD-10-CM | POA: Diagnosis not present

## 2020-01-24 NOTE — Progress Notes (Signed)
[name], [name] ([id number]) Visit Report for 01/17/2020 Allergy List Details Patient Name: Date of Service: [name], [name] 01/17/2020 10:30 A M Medical Record Number: [id number] Patient Account Number: [id number] Date of Birth/Sex: Treating RN: 1945-12-01 (74 y.o. [name] Primary Care Aileen Amore: [name] Other Clinician: Referring Genola Yuille: Treating Esco Joslyn/Extender: Landis Martins Weeks in Treatment: 0 Allergies Active Allergies bee venom protein (honey bee) Reaction: swelling Severity: Moderate Farxiga Reaction: yeast infection Sulfa (Sulfonamide Antibiotics) Reaction: hives Severity: Moderate Allergy Notes Electronic Signature(s) Signed: 01/18/2020 5:18:16 PM By: Levan Hurst RN, BSN Entered By: Levan Hurst on 01/17/2020 11:00:02 -------------------------------------------------------------------------------- Arrival Information Details Patient Name: Date of Service: [name] [name] 01/17/2020 10:30 A M Medical Record Number: [id number] Patient Account Number: [id number] Date of Birth/Sex: Treating RN: Dec 16, 1945 (74 y.o. [name] Primary Care Aayra Hornbaker: [name] Other Clinician: Referring Calen Posch: Treating Jacquilyn Seldon/Extender: Adele Schilder in Treatment: 0 Visit Information Patient Arrived: [name] Arrival Time: 10:54 Accompanied By: alone Transfer Assistance: None Patient Identification Verified: Yes Secondary Verification Process Completed: Yes Patient Requires Transmission-Based Precautions: No Patient Has Alerts: No Electronic Signature(s) Signed: 01/18/2020 5:18:16 PM By: Levan Hurst RN, BSN Entered By: Levan Hurst on 01/17/2020 10:57:52 -------------------------------------------------------------------------------- Clinic Level of Care Assessment Details Patient Name: Date of Service: [name] [name], [name] [name] RBA RA E. 01/17/2020 10:30 A M Medical Record Number: [id number] Patient Account Number:  [id number] Date of Birth/Sex: Treating RN: Oct 11, 1945 (74 y.o. [name] Primary Care Clariza Sickman: [name] Other Clinician: Referring Cailynn Bodnar: Treating Lanay Zinda/Extender: Landis Martins Weeks in Treatment: 0 Clinic Level of Care Assessment Items TOOL 1 Quantity Score []  - 0 Use when EandM and Procedure is performed on INITIAL visit ASSESSMENTS - Nursing Assessment / Reassessment X- 1 20 General Physical Exam (combine w/ comprehensive assessment (listed just below) when performed on new pt. evals) X- 1 25 Comprehensive Assessment (HX, ROS, Risk Assessments, Wounds Hx, etc.) ASSESSMENTS - Wound and Skin Assessment / Reassessment []  - 0 Dermatologic / Skin Assessment (not related to wound area) ASSESSMENTS - Ostomy and/or Continence Assessment and Care []  - 0 Incontinence Assessment and Management []  - 0 Ostomy Care Assessment and Management (repouching, etc.) PROCESS - Coordination of Care X - Simple Patient / Family Education for ongoing care 1 15 []  - 0 Complex (extensive) Patient / Family Education for ongoing care X- 1 10 Staff obtains Programmer, systems, Records, T Results / Process Orders est []  - 0 Staff telephones HHA, Nursing Homes / Clarify orders / etc []  - 0 Routine Transfer to another Facility (non-emergent condition) []  - 0 Routine Hospital Admission (non-emergent condition) X- 1 15 New Admissions / Biomedical engineer / Ordering NPWT Apligraf, etc. , []  - 0 Emergency Hospital Admission (emergent condition) PROCESS - Special Needs []  - 0 Pediatric / Minor Patient Management []  - 0 Isolation Patient Management []  - 0 Hearing / Language / Visual special needs []  - 0 Assessment of Community assistance (transportation, D/C planning, etc.) []  - 0 Additional assistance / Altered mentation []  - 0 Support Surface(s) Assessment (bed, cushion, seat, etc.) INTERVENTIONS - Miscellaneous []  - 0 External ear exam []  - 0 Patient Transfer  (multiple staff / Civil Service fast streamer / Similar devices) []  - 0 Simple Staple / Suture removal (25 or less) []  - 0 Complex Staple / Suture removal (26 or more) []  - 0 Hypo/Hyperglycemic Management (do not check if billed separately) X- 1 15 Ankle / Brachial Index (ABI) - do not check  if billed separately Has the patient been seen at the hospital within the last three years: Yes Total Score: 100 Level Of Care: New/Established - Level 3 Electronic Signature(s) Signed: 01/17/2020 6:08:32 PM By: [name] RN, BSN Signed: 01/17/2020 6:08:32 PM By: [name] RN, BSN Entered By: [name] on 01/17/2020 11:49:28 -------------------------------------------------------------------------------- Encounter Discharge Information Details Patient Name: Date of Service: [name] [name] RBA RA E. 01/17/2020 10:30 A M Medical Record Number: [id number] Patient Account Number: [id number] Date of Birth/Sex: Treating RN: 10/21/1945 (74 y.o. [name] Primary Care Tamesha Ellerbrock: [name] Other Clinician: Referring Rachard Isidro: Treating Konnor Jorden/Extender: Adele Schilder in Treatment: 0 Encounter Discharge Information Items Post Procedure Vitals Discharge Condition: Stable Temperature (F): 98.7 Ambulatory Status: Cane Pulse (bpm): 97 Discharge Destination: Home Respiratory Rate (breaths/min): 18 Transportation: Private Auto Blood Pressure (mmHg): 122/76 Accompanied By: self Schedule Follow-up Appointment: Yes Clinical Summary of Care: Patient Declined Electronic Signature(s) Signed: 01/24/2020 9:55:31 AM By: Carlene Coria RN Entered By: Carlene Coria on 01/17/2020 12:24:40 -------------------------------------------------------------------------------- Lower Extremity Assessment Details Patient Name: Date of Service: [name], [name] 01/17/2020 10:30 A M Medical Record Number: [id number] Patient Account Number: [id number] Date of Birth/Sex: Treating RN: March 17, 1946 (74 y.o. [name] Primary Care Ahkeem Goede: [name] Other Clinician: Referring Asia Favata: Treating Lanie Schelling/Extender: Landis Martins Weeks in Treatment: 0 Edema Assessment Assessed: [Left: No] [Right: No] Edema: [Left: Ye] [Right: s] Calf Left: Right: Point of Measurement: 35 cm From Medial Instep 35 cm cm Ankle Left: Right: Point of Measurement: 9 cm From Medial Instep 24.5 cm cm Vascular Assessment Pulses: Dorsalis Pedis Palpable: [Left:Yes] Blood Pressure: Brachial: [Left:122] Ankle: [Left:Dorsalis Pedis: 160] Ankle Brachial Index: [Left:1.31] Electronic Signature(s) Signed: 01/18/2020 5:18:16 PM By: Levan Hurst RN, BSN Entered By: Levan Hurst on 01/17/2020 11:10:02 -------------------------------------------------------------------------------- Multi-Disciplinary Care Plan Details Patient Name: Date of Service: [name] [name] 01/17/2020 10:30 A M Medical Record Number: [id number] Patient Account Number: [id number] Date of Birth/Sex: Treating RN: [birth year] (74 y.o. [name] Primary Care Jett Fukuda: [name] Other Clinician: Referring Maitland Lesiak: Treating Alaija Ruble/Extender: Adele Schilder in Treatment: 0 Active Inactive Abuse / Safety / Falls / Self Care Management Nursing Diagnoses: Potential for falls Goals: Patient/caregiver will verbalize/demonstrate measures taken to prevent injury and/or falls Date Initiated: 01/17/2020 Target Resolution Date: 02/14/2020 Goal Status: Active Interventions: Assess fall risk on admission and as needed Assess: immobility, friction, shearing, incontinence upon admission and as needed Notes: Nutrition Nursing Diagnoses: Impaired glucose control: actual or potential Potential for alteratiion in Nutrition/Potential for imbalanced nutrition Goals: Patient/caregiver verbalizes understanding of need to maintain therapeutic glucose control per primary care physician Date Initiated:  01/17/2020 Target Resolution Date: 02/14/2020 Goal Status: Active Patient/caregiver will maintain therapeutic glucose control Date Initiated: 01/17/2020 Target Resolution Date: 02/14/2020 Goal Status: Active Interventions: Assess HgA1c results as ordered upon admission and as needed Assess patient nutrition upon admission and as needed per policy Provide education on elevated blood sugars and impact on wound healing Treatment Activities: Patient referred to Primary Care Physician for further nutritional evaluation : 01/17/2020 Notes: Wound/Skin Impairment Nursing Diagnoses: Impaired tissue integrity Knowledge deficit related to ulceration/compromised skin integrity Goals: Patient/caregiver will verbalize understanding of skin care regimen Date Initiated: 01/17/2020 Target Resolution Date: 02/14/2020 Goal Status: Active Ulcer/skin breakdown will have a volume reduction of 30% by week 4 Date Initiated: 01/17/2020 Target Resolution Date: 02/14/2020 Goal Status: Active Interventions: Assess patient/caregiver ability to obtain necessary supplies Assess patient/caregiver ability to perform  ulcer/skin care regimen upon admission and as needed Assess ulceration(s) every visit Provide education on ulcer and skin care Treatment Activities: Skin care regimen initiated : 01/17/2020 Topical wound management initiated : 01/17/2020 Notes: Electronic Signature(s) Signed: 01/17/2020 6:08:32 PM By: [name] RN, BSN Entered By: [name] on 01/17/2020 11:47:49 -------------------------------------------------------------------------------- Pain Assessment Details Patient Name: Date of Service: [name] [name] 01/17/2020 10:30 A M Medical Record Number: [id number] Patient Account Number: [id number] Date of Birth/Sex: Treating RN: 05-15-1946 (74 y.o. [name] Primary Care Deondrick Searls: [name] Other Clinician: Referring Law Corsino: Treating Kasaundra Fahrney/Extender: Landis Martins Weeks in Treatment: 0 Active Problems Location of Pain Severity and Description of Pain Patient Has Paino Yes Site Locations Pain Location: Pain in Ulcers With Dressing Change: Yes Duration of the Pain. Constant / Intermittento Intermittent Rate the pain. Current Pain Level: 2 Worst Pain Level: 6 Least Pain Level: 0 Character of Pain Describe the Pain: Aching Pain Management and Medication Current Pain Management: Medication: Yes Cold Application: No Rest: No Massage: No Activity: No T.E.N.S.: No Heat Application: No Leg drop or elevation: No Is the Current Pain Management Adequate: Adequate How does your wound impact your activities of daily livingo Sleep: No Bathing: No Appetite: No Relationship With Others: No Bladder Continence: No Emotions: No Bowel Continence: No Work: No Toileting: No Drive: No Dressing: No Hobbies: No Electronic Signature(s) Signed: 01/18/2020 5:18:16 PM By: Levan Hurst RN, BSN Entered By: Levan Hurst on 01/17/2020 11:22:00 -------------------------------------------------------------------------------- Patient/Caregiver Education Details Patient Name: Date of Service: [name] [name] 6/9/2021andnbsp10:30 A M Medical Record Number: [id number] Patient Account Number: [id number] Date of Birth/Gender: Treating RN: 14-Dec-1945 (74 y.o. [name] Primary Care Physician: [name] Other Clinician: Referring Physician: Treating Physician/Extender: Adele Schilder in Treatment: 0 Education Assessment Education Provided To: Patient Education Topics Provided Elevated Blood Sugar/ Impact on Healing: Handouts: Elevated Blood Sugars: How Do They Affect Wound Healing Methods: Explain/Verbal, Printed Responses: Reinforcements needed, State content correctly Offloading: Handouts: What is Offloadingo Methods: Explain/Verbal, Printed Responses: Reinforcements needed, State content  correctly Scissors: o Handouts: Welcome T [facility] [place] o Methods: Explain/Verbal, Printed Responses: Reinforcements needed, State content correctly Wound/Skin Impairment: Handouts: Caring for Your Ulcer, Skin Care Do's and Dont's Methods: Explain/Verbal, Printed Responses: Reinforcements needed, State content correctly Electronic Signature(s) Signed: 01/17/2020 6:08:32 PM By: [name] RN, BSN Entered By: [name] on 01/17/2020 11:48:54 -------------------------------------------------------------------------------- Wound Assessment Details Patient Name: Date of Service: [name] [name], [name] [name] RBA RA E. 01/17/2020 10:30 A M Medical Record Number: [id number] Patient Account Number: [id number] Date of Birth/Sex: Treating RN: February 12, 1946 (74 y.o. [name] Primary Care Latisia Hilaire: [name] Other Clinician: Referring Katerine Morua: Treating Prabhleen Montemayor/Extender: Landis Martins Weeks in Treatment: 0 Wound Status Wound Number: 1 Primary Diabetic Wound/Ulcer of the Lower Extremity Etiology: Wound Location: Left, Medial, Plantar Foot Wound Open Wounding Event: Gradually Appeared Status: Date Acquired: 05/11/2019 Comorbid Sleep Apnea, Deep Vein Thrombosis, Hypertension, Colitis, Type Weeks Of Treatment: 0 History: II Diabetes, Gout, Osteoarthritis, Neuropathy Clustered Wound: No Photos Photo Uploaded By: Mikeal Hawthorne on 01/18/2020 10:59:59 Wound Measurements Length: (cm) 1.3 Width: (cm) 1 Depth: (cm) 0.4 Area: (cm) 1.021 Volume: (cm) 0.408 % Reduction in Area: % Reduction in Volume: Epithelialization: None Tunneling: No Undermining: No Wound Description Classification: Grade 2 Wound Margin: Thickened Exudate Amount: Medium Exudate Type: Serosanguineous Exudate Color: red, brown Foul Odor After Cleansing: No Slough/Fibrino Yes Wound Bed Granulation  Amount: Large (67-100%) Exposed Structure Granulation Quality:  Pink Fascia Exposed: No Necrotic Amount: Small (1-33%) Fat Layer (Subcutaneous Tissue) Exposed: Yes Necrotic Quality: Adherent Slough Tendon Exposed: No Muscle Exposed: No Joint Exposed: No Bone Exposed: No Electronic Signature(s) Signed: 01/18/2020 5:18:16 PM By: Levan Hurst RN, BSN Entered By: Levan Hurst on 01/17/2020 11:21:31 -------------------------------------------------------------------------------- [place] Details Patient Name: Date of Service: [name] [name], [name] [name] RBA RA E. 01/17/2020 10:30 A M Medical Record Number: [id number] Patient Account Number: [id number] Date of Birth/Sex: Treating RN: [birth year] (74 y.o. [name] Primary Care Reg Bircher: [name] Other Clinician: Referring Marche Hottenstein: Treating Abdimalik Mayorquin/Extender: Landis Martins Weeks in Treatment: 0 Vital Signs Time Taken: 10:57 Temperature (F): 98.7 Height (in): 66 Pulse (bpm): 97 Source: Stated Respiratory Rate (breaths/min): 18 Weight (lbs): 245 Blood Pressure (mmHg): 122/76 Source: Stated Reference Range: 80 - 120 mg / dl Body Mass Index (BMI): 39.5 Electronic Signature(s) Signed: 01/18/2020 5:18:16 PM By: Levan Hurst RN, BSN Entered By: Levan Hurst on 01/17/2020 10:58:15

## 2020-01-24 NOTE — Progress Notes (Addendum)
MAYDELIN, DEMING ([id number]) Visit Report for 01/24/2020 Chief Complaint Document Details Patient Name: Date of Service: MAELEE, HOOT RA E. 01/24/2020 8:30 A M Medical Record Number: [id number] Patient Account Number: [id number] Date of Birth/Sex: Treating RN: 1945-12-16 (74 y.o. [name] Other Clinician: Referring Provider: Treating Provider/Extender: Landis Martins Weeks in Treatment: 1 Information Obtained from: Patient Chief Complaint Left foot ulcer Electronic Signature(s) Signed: 01/24/2020 8:45:34 AM By: Worthy Keeler PA-C Entered By: Worthy Keeler on 01/24/2020 08:45:34 -------------------------------------------------------------------------------- Debridement Details Patient Name: Date of Service: [name] 01/24/2020 8:30 A M Medical Record Number: [id number] Patient Account Number: [id number] Date of Birth/Sex: Treating RN: May 16, 1946 (74 y.o. [name] Other Clinician: Referring Provider: Treating Provider/Extender: Landis Martins Weeks in Treatment: 1 Debridement Performed for Assessment: Wound #1 Left,Medial,Plantar Foot Performed By: Physician Worthy Keeler, PA Debridement Type: Debridement Severity of Tissue Pre Debridement: Fat layer exposed Level of Consciousness (Pre-procedure): Awake and Alert Pre-procedure Verification/Time Out Yes - 09:20 Taken: Start Time: 09:24 T Area Debrided (L x W): otal 2 (cm) x 2 (cm) = 4 (cm) Tissue and other material debrided: Viable, Non-Viable, Callus, Slough, Subcutaneous, Skin: Epidermis, Slough Level: Skin/Subcutaneous Tissue Debridement Description: Excisional Instrument: Curette Bleeding: Minimum Hemostasis Achieved: Pressure End Time: 09:29 Procedural Pain: 0 Post Procedural Pain: 0 Response to Treatment: Procedure was tolerated well Level of Consciousness (Post- Awake and  Alert procedure): Post Debridement Measurements of Total Wound Length: (cm) 1.1 Width: (cm) 1.8 Depth: (cm) 0.3 Volume: (cm) 0.467 Character of Wound/Ulcer Post Debridement: Improved Severity of Tissue Post Debridement: Fat layer exposed Post Procedure Diagnosis Same as Pre-procedure Electronic Signature(s) Signed: 01/25/2020 1:39:37 PM By: Worthy Keeler PA-C Signed: 01/25/2020 5:22:52 PM By: [name] RN, BSN Entered By: [name] on 01/24/2020 09:27:38 -------------------------------------------------------------------------------- HPI Details Patient Name: Date of Service: Lobos, [name] 01/24/2020 8:30 A M Medical Record Number: [id number] Patient Account Number: [id number] Date of Birth/Sex: Treating RN: [birth year] (74 y.o. [name] Other Clinician: Referring Provider: Treating Provider/Extender: Adele Schilder in Treatment: 1 History of Present Illness HPI Description: 01/17/2020 upon evaluation today patient presents for initial evaluation here in our clinic concerning issues she has been having with a left medial/plantar foot ulcer. This is actually been an issue for her since October 2020. She has been seeing Dr. Doran Durand for quite some time during that course. Fortunately there is no signs of active infection at this time. Or least no mention of this to have seen in general. With that being said unfortunately I do see some signs of erythema noted today that does have me concerned about the possibility of infection at this point in the surrounding area of the wound. There is also a warm to touch at the site which is somewhat concerning. Fortunately there is no evidence of systemic infection which is great news. The patient does have a history of diabetes mellitus type 2, Charcot foot which is what led to the wound, and hypertension. She notes that she was in a cast for some time with Dr. Doran Durand for  about 8 weeks. During that time they were utilizing according to the patient silver nitrate along with a foam doughnut and then Coban to secure in place in the cast in place. With that being said I do not have the actual records to review  we are going to try to get a hold of those unfortunately they would not flow over into care everywhere I did look today. She has been seeing Dr. Doran Durand and his physician assistant Larkin Ina up until the end of May and apparently is still seeing them on a regular basis every 2 weeks roughly. She has also tried Iodosorb without effect here. 01/24/2020 upon evaluation today patient actually appears to be doing quite well with regard to her wounds. She has been tolerating the dressing changes without complication. Fortunately there is no signs of active infection spreading which is good news. Her culture did show signs of Staph aureus I did place her on Augmentin due to the erythema surrounding the wound. With that being said the wound does appear to be doing better she has her longer walking cast/boot and I think that is actually good for her for the time being. I am considering reinitiating total contact cast when she gets back from vacation but next week she will actually be out of town at [employer] she knows not to get in the water but she still obviously is planning to enjoy herself she is going to take it easy on her foot however. Electronic Signature(s) Signed: 01/24/2020 9:39:39 AM By: Worthy Keeler PA-C Entered By: Worthy Keeler on 01/24/2020 09:39:39 -------------------------------------------------------------------------------- Physical Exam Details Patient Name: Date of Service: Dunphy, [name] 01/24/2020 8:30 A M Medical Record Number: [id number] Patient Account Number: [id number] Date of Birth/Sex: Treating RN: 10/30/1945 (74 y.o. [name] Other Clinician: Referring Provider: Treating Provider/Extender:  Landis Martins Weeks in Treatment: 1 Constitutional Well-nourished and well-hydrated in no acute distress. Respiratory normal breathing without difficulty. Psychiatric this patient is able to make decisions and demonstrates good insight into disease process. Alert and Oriented x 3. pleasant and cooperative. Notes Patient's wound bed actually showed signs of good granulation at this time. There does not appear to be any evidence of active infection and overall I am extremely pleased with where things stand. I do believe that the patient is making good progress and just in the 2 visits that have seen her over the past week the wound looks significantly better than when she arrived in the clinic. Overall I am extremely happy with what we are seeing and I do believe the [facility] has helped her tremendously at this point. Electronic Signature(s) Signed: 01/24/2020 9:40:48 AM By: Worthy Keeler PA-C Entered By: Worthy Keeler on 01/24/2020 09:40:48 -------------------------------------------------------------------------------- Physician Orders Details Patient Name: Date of Service: Mitchener, [name] 01/24/2020 8:30 A M Medical Record Number: [id number] Patient Account Number: [id number] Date of Birth/Sex: Treating RN: [birth year] (74 y.o. [name] Other Clinician: Referring Provider: Treating Provider/Extender: Adele Schilder in Treatment: 1 Verbal / Phone Orders: No Diagnosis Coding ICD-10 Coding Code Description E11.621 Type 2 diabetes mellitus with foot ulcer L97.522 Non-pressure chronic ulcer of other part of left foot with fat layer exposed M14.672 Charcot's joint, left ankle and foot E11.40 Type 2 diabetes mellitus with diabetic neuropathy, unspecified I10 Essential (primary) hypertension Follow-up Appointments Return Appointment in 2 weeks. Dressing Change Frequency Wound #1  Left,Medial,Plantar Foot Change Dressing every other day. Wound Cleansing Wound #1 Left,Medial,Plantar Foot May shower and wash wound with soap and water. Primary Wound Dressing Wound #1 Left,Medial,Plantar Foot Hydrofera Blue - classic moistened with saline Secondary Dressing Wound #1 Left,Medial,Plantar Foot Foam -  foam donut Kerlix/Rolled Gauze Dry Gauze Edema Control Avoid standing for long periods of time Elevate legs to the level of the heart or above for 30 minutes daily and/or when sitting, a frequency of: Off-Loading Removable cast walker boot to: - left foot (pt has at home) Electronic Signature(s) Signed: 01/25/2020 1:39:37 PM By: Worthy Keeler PA-C Signed: 01/25/2020 5:22:52 PM By: [name] RN, BSN Entered By: [name] on 01/24/2020 09:30:14 -------------------------------------------------------------------------------- Problem List Details Patient Name: Date of Service: [name] 01/24/2020 8:30 A M Medical Record Number: [id number] Patient Account Number: [id number] Date of Birth/Sex: Treating RN: 13-Jan-1946 (74 y.o. [name] Other Clinician: Referring Provider: Treating Provider/Extender: Landis Martins Weeks in Treatment: 1 Active Problems ICD-10 Encounter Code Description Active Date MDM Diagnosis E11.621 Type 2 diabetes mellitus with foot ulcer 01/17/2020 No Yes L97.522 Non-pressure chronic ulcer of other part of left foot with fat layer exposed 01/17/2020 No Yes M14.672 Charcot's joint, left ankle and foot 01/17/2020 No Yes E11.40 Type 2 diabetes mellitus with diabetic neuropathy, unspecified 01/17/2020 No Yes I10 Essential (primary) hypertension 01/17/2020 No Yes Inactive Problems Resolved Problems Electronic Signature(s) Signed: 01/24/2020 8:45:29 AM By: Worthy Keeler PA-C Entered By: Worthy Keeler on 01/24/2020  08:45:28 -------------------------------------------------------------------------------- Progress Note Details Patient Name: Date of Service: Convey, [name] 01/24/2020 8:30 A M Medical Record Number: [id number] Patient Account Number: [id number] Date of Birth/Sex: Treating RN: 28-Jan-1946 (74 y.o. [name] Other Clinician: Referring Provider: Treating Provider/Extender: Adele Schilder in Treatment: 1 Subjective Chief Complaint Information obtained from Patient Left foot ulcer History of Present Illness (HPI) 01/17/2020 upon evaluation today patient presents for initial evaluation here in our clinic concerning issues she has been having with a left medial/plantar foot ulcer. This is actually been an issue for her since October 2020. She has been seeing Dr. Doran Durand for quite some time during that course. Fortunately there is no signs of active infection at this time. Or least no mention of this to have seen in general. With that being said unfortunately I do see some signs of erythema noted today that does have me concerned about the possibility of infection at this point in the surrounding area of the wound. There is also a warm to touch at the site which is somewhat concerning. Fortunately there is no evidence of systemic infection which is great news. The patient does have a history of diabetes mellitus type 2, Charcot foot which is what led to the wound, and hypertension. She notes that she was in a cast for some time with Dr. Doran Durand for about 8 weeks. During that time they were utilizing according to the patient silver nitrate along with a foam doughnut and then Coban to secure in place in the cast in place. With that being said I do not have the actual records to review we are going to try to get a hold of those unfortunately they would not flow over into care everywhere I did look today. She has been seeing Dr. Doran Durand  and his physician assistant Larkin Ina up until the end of May and apparently is still seeing them on a regular basis every 2 weeks roughly. She has also tried Iodosorb without effect here. 01/24/2020 upon evaluation today patient actually appears to be doing quite well with regard to her wounds. She has been tolerating the dressing changes without complication. Fortunately there  is no signs of active infection spreading which is good news. Her culture did show signs of Staph aureus I did place her on Augmentin due to the erythema surrounding the wound. With that being said the wound does appear to be doing better she has her longer walking cast/boot and I think that is actually good for her for the time being. I am considering reinitiating total contact cast when she gets back from vacation but next week she will actually be out of town at [employer] she knows not to get in the water but she still obviously is planning to enjoy herself she is going to take it easy on her foot however. Objective Constitutional Well-nourished and well-hydrated in no acute distress. Vitals Time Taken: 9:13 AM, Height: 66 in, Weight: 245 lbs, BMI: 39.5, Temperature: 98.7 F, Pulse: 99 bpm, Respiratory Rate: 18 breaths/min, Blood Pressure: 152/85 mmHg. Respiratory normal breathing without difficulty. Psychiatric this patient is able to make decisions and demonstrates good insight into disease process. Alert and Oriented x 3. pleasant and cooperative. General Notes: Patient's wound bed actually showed signs of good granulation at this time. There does not appear to be any evidence of active infection and overall I am extremely pleased with where things stand. I do believe that the patient is making good progress and just in the 2 visits that have seen her over the past week the wound looks significantly better than when she arrived in the clinic. Overall I am extremely happy with what we are seeing and I do believe the  [facility] has helped her tremendously at this point. Integumentary (Hair, Skin) Wound #1 status is Open. Original cause of wound was Gradually Appeared. The wound is located on the [place]. The wound measures 1.1cm length x 1.6cm width x 0.3cm depth; 1.382cm^2 area and 0.415cm^3 volume. There is Fat Layer (Subcutaneous Tissue) Exposed exposed. There is no tunneling or undermining noted. There is a medium amount of serosanguineous drainage noted. The wound margin is thickened. There is large (67-100%) pink granulation within the wound bed. There is a small (1-33%) amount of necrotic tissue within the wound bed including Adherent Slough. Assessment Active Problems ICD-10 Type 2 diabetes mellitus with foot ulcer Non-pressure chronic ulcer of other part of left foot with fat layer exposed Charcot's joint, left ankle and foot Type 2 diabetes mellitus with diabetic neuropathy, unspecified Essential (primary) hypertension Procedures Wound #1 Pre-procedure diagnosis of Wound #1 is a Diabetic Wound/Ulcer of the Lower Extremity located on the Left,Medial,Plantar Foot .Severity of Tissue Pre Debridement is: Fat layer exposed. There was a Excisional Skin/Subcutaneous Tissue Debridement with a total area of 4 sq cm performed by Worthy Keeler, PA. With the following instrument(s): Curette to remove Viable and Non-Viable tissue/material. Material removed includes Callus, Subcutaneous Tissue, Slough, and Skin: Epidermis. No specimens were taken. A time out was conducted at 09:20, prior to the start of the procedure. A Minimum amount of bleeding was controlled with Pressure. The procedure was tolerated well with a pain level of 0 throughout and a pain level of 0 following the procedure. Post Debridement Measurements: 1.1cm length x 1.8cm width x 0.3cm depth; 0.467cm^3 volume. Character of Wound/Ulcer Post Debridement is improved. Severity of Tissue Post Debridement is: Fat layer  exposed. Post procedure Diagnosis Wound #1: Same as Pre-Procedure Plan Follow-up Appointments: Return Appointment in 2 weeks. Dressing Change Frequency: Wound #1 Left,Medial,Plantar Foot: Change Dressing every other day. Wound Cleansing: Wound #1 Left,Medial,Plantar Foot: May shower and wash  wound with soap and water. Primary Wound Dressing: Wound #1 Left,Medial,Plantar Foot: Hydrofera Blue - classic moistened with saline Secondary Dressing: Wound #1 Left,Medial,Plantar Foot: Foam - foam donut Kerlix/Rolled Gauze Dry Gauze Edema Control: Avoid standing for long periods of time Elevate legs to the level of the heart or above for 30 minutes daily and/or when sitting, a frequency of: Off-Loading: Removable cast walker boot to: - left foot (pt has at home) 1. I would recommend currently that we go ahead and continue with the [facility] dressing I think that still the optimal thing to do. 2. I am also can recommend that the patient continue to use her walking boot I think that still good to be of utmost importance as well. As far as trying to offload his concern for the time being. 3. I am also can recommend that the patient continue to use a foam doughnut around the wound to offload as well and the [facility] centrally. Overall I feel like that after the debridement today the wound looks 120% better than it did upon admission last week. 4. In regard to where we go from here depending on how she appears when I see her in 2 weeks time after her beach trip we may consider total contact cast at that point that could be the ideal thing to do. With that being said I do not want to put her through too much if it is not absolutely necessary obviously if she is doing excellent by that time with the walking cast and current measures that we may not even need the total contact cast. We will see patient back for reevaluation in 1 week here in the clinic. If anything worsens or changes patient  will contact our office for additional recommendations. Electronic Signature(s) Signed: 01/24/2020 9:42:10 AM By: Worthy Keeler PA-C Entered By: Worthy Keeler on 01/24/2020 09:42:09 -------------------------------------------------------------------------------- SuperBill Details Patient Name: Date of Service: Carrier, [name] 01/24/2020 Medical Record Number: [id number] Patient Account Number: [id number] Date of Birth/Sex: Treating RN: 1945-09-20 (74 y.o. [name], [name] Primary Care Provider: [name] Other Clinician: Referring Provider: Treating Provider/Extender: Landis Martins Weeks in Treatment: 1 Diagnosis Coding ICD-10 Codes Code Description [phone] Type 2 diabetes mellitus with foot ulcer L97.522 Non-pressure chronic ulcer of other part of left foot with fat layer exposed M14.672 Charcot's joint, left ankle and foot E11.40 Type 2 diabetes mellitus with diabetic neuropathy, unspecified I10 Essential (primary) hypertension Facility Procedures Physician Procedures : CPT4 Code Description Modifier [id number] - WC PHYS SUBQ TISS 20 SQ CM ICD-10 Diagnosis Description L97.522 Non-pressure chronic ulcer of other part of left foot with fat layer exposed Quantity: 1 Electronic Signature(s) Signed: 01/24/2020 9:42:16 AM By: Worthy Keeler PA-C Entered By: Worthy Keeler on 01/24/2020 09:42:16

## 2020-01-25 ENCOUNTER — Encounter: Payer: Self-pay | Admitting: Internal Medicine

## 2020-01-25 NOTE — Progress Notes (Signed)
[name] [name], [name] [name] ([id number]) Visit Report for 01/24/2020 Arrival Information Details Patient Name: Date of Service: [name] [name], [name] E. 01/24/2020 8:30 A M Medical Record Number: [id number] Patient Account Number: [id number] Date of Birth/Sex: Treating RN: 30-Apr-1946 (74 y.o. [name] [name] Primary Care Athenia Rys: [name] Other Clinician: Referring Srinivas Lippman: Treating Amarachi Kotz/Extender: Adele Schilder in Treatment: 1 Visit Information History Since Last Visit All ordered tests and consults were completed: No Patient Arrived: [name] [name] Added or deleted any medications: No Arrival Time: 09:06 Any new allergies or adverse reactions: No Accompanied By: self Had a fall or experienced change in No Transfer Assistance: None activities of daily living that may affect Patient Identification Verified: Yes risk of falls: Secondary Verification Process Completed: Yes Signs or symptoms of abuse/neglect since last visito No Patient Requires Transmission-Based Precautions: No Hospitalized since last visit: No Patient Has Alerts: No Implantable device outside of the clinic excluding No cellular tissue based products placed in the center since last visit: Has Dressing in Place as Prescribed: Yes Has Compression in Place as Prescribed: Yes Pain Present Now: No Electronic Signature(s) Signed: 01/24/2020 9:54:21 AM By: Carlene Coria RN Entered By: Carlene Coria on 01/24/2020 09:07:20 -------------------------------------------------------------------------------- Encounter Discharge Information Details Patient Name: Date of Service: [name] Amato RA E. 01/24/2020 8:30 A M Medical Record Number: [id number] Patient Account Number: [id number] Date of Birth/Sex: Treating RN: Dec 27, 1945 (74 y.o. [name] [name] Primary Care Elvan Ebron: [name] Other Clinician: Referring Taneeka Curtner: Treating Paraskevi Funez/Extender: Adele Schilder in Treatment: 1 Encounter Discharge  Information Items Post Procedure Vitals Discharge Condition: Stable Temperature (F): 98.7 Ambulatory Status: Cane Pulse (bpm): 99 Discharge Destination: Home Respiratory Rate (breaths/min): 18 Transportation: Private Auto Blood Pressure (mmHg): 152/85 Accompanied By: self Schedule Follow-up Appointment: Yes Clinical Summary of Care: Patient Declined Electronic Signature(s) Signed: 01/25/2020 5:22:52 PM By: [name] RN, BSN Entered By: [name] on 01/24/2020 09:42:42 -------------------------------------------------------------------------------- Lower Extremity Assessment Details Patient Name: Date of Service: [name] Amato RA E. 01/24/2020 8:30 A M Medical Record Number: [id number] Patient Account Number: [id number] Date of Birth/Sex: Treating RN: 1946-01-28 (74 y.o. [name] [name] Primary Care Davinci Glotfelty: [name] Other Clinician: Referring Liyanna Cartwright: Treating Sabino Denning/Extender: Landis Martins Weeks in Treatment: 1 Edema Assessment Assessed: [Left: No] [Right: No] Edema: [Left: Ye] [Right: s] Calf Left: Right: Point of Measurement: 35 cm From Medial Instep 35 cm cm Ankle Left: Right: Point of Measurement: 9 cm From Medial Instep 24.5 cm cm Electronic Signature(s) Signed: 01/24/2020 9:54:21 AM By: Carlene Coria RN Entered By: Carlene Coria on 01/24/2020 09:15:15 -------------------------------------------------------------------------------- Multi-Disciplinary Care Plan Details Patient Name: Date of Service: [name] Amato RA E. 01/24/2020 8:30 A M Medical Record Number: [id number] Patient Account Number: [id number] Date of Birth/Sex: Treating RN: [birth year] (74 y.o. [name] [name] Primary Care Jaelah Hauth: [name] Other Clinician: Referring Marlyn Tondreau: Treating Cavin Longman/Extender: Adele Schilder in Treatment: 1 Active Inactive Abuse / Safety / Falls / Self Care Management Nursing Diagnoses: Potential for  falls Goals: Patient/caregiver will verbalize/demonstrate measures taken to prevent injury and/or falls Date Initiated: 01/17/2020 Target Resolution Date: 02/14/2020 Goal Status: Active Interventions: Assess fall risk on admission and as needed Assess: immobility, friction, shearing, incontinence upon admission and as needed Notes: Nutrition Nursing Diagnoses: Impaired glucose control: actual or potential Potential for alteratiion in Nutrition/Potential for imbalanced nutrition Goals: Patient/caregiver verbalizes understanding of need to maintain therapeutic glucose control per primary care physician Date Initiated: 01/17/2020 Target Resolution Date: 02/14/2020  Goal Status: Active Patient/caregiver will maintain therapeutic glucose control Date Initiated: 01/17/2020 Target Resolution Date: 02/14/2020 Goal Status: Active Interventions: Assess HgA1c results as ordered upon admission and as needed Assess patient nutrition upon admission and as needed per policy Provide education on elevated blood sugars and impact on wound healing Treatment Activities: Patient referred to Primary Care Physician for further nutritional evaluation : 01/17/2020 Notes: Wound/Skin Impairment Nursing Diagnoses: Impaired tissue integrity Knowledge deficit related to ulceration/compromised skin integrity Goals: Patient/caregiver will verbalize understanding of skin care regimen Date Initiated: 01/17/2020 Target Resolution Date: 02/14/2020 Goal Status: Active Ulcer/skin breakdown will have a volume reduction of 30% by week 4 Date Initiated: 01/17/2020 Target Resolution Date: 02/14/2020 Goal Status: Active Interventions: Assess patient/caregiver ability to obtain necessary supplies Assess patient/caregiver ability to perform ulcer/skin care regimen upon admission and as needed Assess ulceration(s) every visit Provide education on ulcer and skin care Treatment Activities: Skin care regimen initiated : 01/17/2020 Topical  wound management initiated : 01/17/2020 Notes: Electronic Signature(s) Signed: 01/25/2020 5:22:52 PM By: [name] RN, BSN Entered By: [name] on 01/24/2020 09:24:55 -------------------------------------------------------------------------------- Pain Assessment Details Patient Name: Date of Service: [name] Amato RA E. 01/24/2020 8:30 A M Medical Record Number: [id number] Patient Account Number: [id number] Date of Birth/Sex: Treating RN: 09-09-1945 (74 y.o. [name] [name] Primary Care Jasan Doughtie: [name] Other Clinician: Referring Trinnity Breunig: Treating Matisse Salais/Extender: Landis Martins Weeks in Treatment: 1 Active Problems Location of Pain Severity and Description of Pain Patient Has Paino No Site Locations Pain Management and Medication Current Pain Management: Electronic Signature(s) Signed: 01/24/2020 9:54:21 AM By: Carlene Coria RN Entered By: Carlene Coria on 01/24/2020 09:15:08 -------------------------------------------------------------------------------- Patient/Caregiver Education Details Patient Name: Date of Service: [name] Amato RA E. 6/16/2021andnbsp8:30 A M Medical Record Number: [id number] Patient Account Number: [id number] Date of Birth/Gender: Treating RN: 1946-07-05 (74 y.o. [name] [name] Primary Care Physician: [name] Other Clinician: Referring Physician: Treating Physician/Extender: Adele Schilder in Treatment: 1 Education Assessment Education Provided To: Patient Education Topics Provided Elevated Blood Sugar/ Impact on Healing: Handouts: Elevated Blood Sugars: How Do They Affect Wound Healing Methods: Explain/Verbal Responses: Reinforcements needed, State content correctly Offloading: Methods: Explain/Verbal Responses: Reinforcements needed, State content correctly Wound/Skin Impairment: Methods: Explain/Verbal Responses: Reinforcements needed, State content correctly Electronic  Signature(s) Signed: 01/25/2020 5:22:52 PM By: [name] RN, BSN Entered By: [name] on 01/24/2020 09:25:38 -------------------------------------------------------------------------------- Wound Assessment Details Patient Name: Date of Service: [name] Amato RA E. 01/24/2020 8:30 A M Medical Record Number: [id number] Patient Account Number: [id number] Date of Birth/Sex: Treating RN: 1945/10/22 (74 y.o. [name] [name] Primary Care Jaeline Whobrey: [name] Other Clinician: Referring Samaad Hashem: Treating Camrin Gearheart/Extender: Landis Martins Weeks in Treatment: 1 Wound Status Wound Number: 1 Primary Diabetic Wound/Ulcer of the Lower Extremity Etiology: Wound Location: Left, Medial, Plantar Foot Wound Open Wounding Event: Gradually Appeared Status: Date Acquired: 05/11/2019 Comorbid Sleep Apnea, Deep Vein Thrombosis, Hypertension, Colitis, Type Weeks Of Treatment: 1 History: II Diabetes, Gout, Osteoarthritis, Neuropathy Clustered Wound: No Photos Photo Uploaded By: Mikeal Hawthorne on 01/24/2020 15:47:40 Wound Measurements Length: (cm) 1.1 % Width: (cm) 1.6 % Depth: (cm) 0.3 Ep Area: (cm) 1.382 T Volume: (cm) 0.415 U Reduction in Area: -35.4% Reduction in Volume: -1.7% ithelialization: None unneling: No ndermining: No Wound Description Classification: Grade 2 Fo Wound Margin: Thickened Sl Exudate Amount: Medium Exudate Type: Serosanguineous Exudate Color: red, brown ul Odor After Cleansing: No ough/Fibrino Yes Wound Bed Granulation Amount: Large (67-100%) Exposed Structure Granulation  Quality: Pink Fascia Exposed: No Necrotic Amount: Small (1-33%) Fat Layer (Subcutaneous Tissue) Exposed: Yes Necrotic Quality: Adherent Slough Tendon Exposed: No Muscle Exposed: No Joint Exposed: No Bone Exposed: No Treatment Notes Wound #1 (Left, Medial, Plantar Foot) 3. Primary Dressing Applied Hydrofera Blue 4. Secondary Dressing Dry Gauze Roll  Gauze Foam 5. Secured With Tape 7. Footwear/Offloading device applied Removable Cast Walker/Walking Boot Notes normal saline for hydroferablue classic Electronic Signature(s) Signed: 01/24/2020 9:54:21 AM By: Carlene Coria RN Entered By: Carlene Coria on 01/24/2020 09:15:45 -------------------------------------------------------------------------------- Vitals Details Patient Name: Date of Service: [name] 01/24/2020 8:30 A M Medical Record Number: [id number] Patient Account Number: [id number] Date of Birth/Sex: Treating RN: 02/27/1946 (74 y.o. [name] [name] Primary Care Romy Mcgue: [name] Other Clinician: Referring Shannara Winbush: Treating Blakely Maranan/Extender: Landis Martins Weeks in Treatment: 1 Vital Signs Time Taken: 09:13 Temperature (F): 98.7 Height (in): 66 Pulse (bpm): 99 Weight (lbs): 245 Respiratory Rate (breaths/min): 18 Body Mass Index (BMI): 39.5 Blood Pressure (mmHg): 152/85 Reference Range: 80 - 120 mg / dl Electronic Signature(s) Signed: 01/24/2020 9:54:21 AM By: Carlene Coria RN Entered By: Carlene Coria on 01/24/2020 09:15:02

## 2020-02-06 ENCOUNTER — Other Ambulatory Visit: Payer: Self-pay

## 2020-02-06 ENCOUNTER — Ambulatory Visit (HOSPITAL_COMMUNITY): Payer: Medicare Other | Attending: Cardiovascular Disease

## 2020-02-06 DIAGNOSIS — I10 Essential (primary) hypertension: Secondary | ICD-10-CM | POA: Insufficient documentation

## 2020-02-06 DIAGNOSIS — G4733 Obstructive sleep apnea (adult) (pediatric): Secondary | ICD-10-CM | POA: Diagnosis not present

## 2020-02-06 DIAGNOSIS — I4510 Unspecified right bundle-branch block: Secondary | ICD-10-CM | POA: Insufficient documentation

## 2020-02-07 ENCOUNTER — Encounter (HOSPITAL_BASED_OUTPATIENT_CLINIC_OR_DEPARTMENT_OTHER): Payer: Medicare Other | Admitting: Physician Assistant

## 2020-02-07 DIAGNOSIS — E785 Hyperlipidemia, unspecified: Secondary | ICD-10-CM | POA: Diagnosis not present

## 2020-02-07 DIAGNOSIS — I10 Essential (primary) hypertension: Secondary | ICD-10-CM | POA: Diagnosis not present

## 2020-02-07 DIAGNOSIS — M109 Gout, unspecified: Secondary | ICD-10-CM | POA: Diagnosis not present

## 2020-02-07 DIAGNOSIS — E11621 Type 2 diabetes mellitus with foot ulcer: Secondary | ICD-10-CM | POA: Diagnosis not present

## 2020-02-07 DIAGNOSIS — L97522 Non-pressure chronic ulcer of other part of left foot with fat layer exposed: Secondary | ICD-10-CM | POA: Diagnosis not present

## 2020-02-07 DIAGNOSIS — E11610 Type 2 diabetes mellitus with diabetic neuropathic arthropathy: Secondary | ICD-10-CM | POA: Diagnosis not present

## 2020-02-07 NOTE — Progress Notes (Addendum)
[name] [name], [name] [name] ([id number]) Visit Report for 02/07/2020 Chief Complaint Document Details Patient Name: Date of Service: [name] [name], [name] [name] 02/07/2020 2:00 PM Medical Record Number: [id number] Patient Account Number: [id number] Date of Birth/Sex: Treating RN: Nov 27, 1945 (74 y.o. [name] Other Clinician: Referring Provider: Treating Provider/Extender: Adele Schilder in Treatment: 3 Information Obtained from: Patient Chief Complaint Left foot ulcer Electronic Signature(s) Signed: 02/07/2020 3:10:36 PM By: Worthy Keeler PA-C Entered By: Worthy Keeler on 02/07/2020 15:10:34 -------------------------------------------------------------------------------- Debridement Details Patient Name: Date of Service: [name] Amato RA E. 02/07/2020 2:00 PM Medical Record Number: [id number] Patient Account Number: [id number] Date of Birth/Sex: Treating RN: 1946/01/03 (74 y.o. [name] Other Clinician: Referring Provider: Treating Provider/Extender: Adele Schilder in Treatment: 3 Debridement Performed for Assessment: Wound #1 Left,Medial,Plantar Foot Performed By: Physician Worthy Keeler, PA Debridement Type: Debridement Severity of Tissue Pre Debridement: Fat layer exposed Level of Consciousness (Pre-procedure): Awake and Alert Pre-procedure Verification/Time Out Yes - 15:10 Taken: Start Time: 15:13 Pain Control: Lidocaine 4% T opical Solution T Area Debrided (L x W): otal 1.5 (cm) x 1.5 (cm) = 2.25 (cm) Tissue and other material debrided: Viable, Non-Viable, Callus, Subcutaneous, Skin: Epidermis Level: Skin/Subcutaneous Tissue Debridement Description: Excisional Instrument: Curette Bleeding: Minimum Hemostasis Achieved: Pressure End Time: 15:19 Procedural Pain: 0 Post Procedural Pain: 0 Response to Treatment: Procedure was tolerated well Level of  Consciousness (Post- Awake and Alert procedure): Post Debridement Measurements of Total Wound Length: (cm) 1.1 Width: (cm) 1.2 Depth: (cm) 0.3 Volume: (cm) 0.311 Character of Wound/Ulcer Post Debridement: Improved Severity of Tissue Post Debridement: Fat layer exposed Post Procedure Diagnosis Same as Pre-procedure Electronic Signature(s) Signed: 02/07/2020 5:49:12 PM By: [name] RN, BSN Signed: 02/07/2020 5:59:30 PM By: Worthy Keeler PA-C Entered By: [name] on 02/07/2020 15:18:00 -------------------------------------------------------------------------------- HPI Details Patient Name: Date of Service: [name] [name], [name] RA E. 02/07/2020 2:00 PM Medical Record Number: [id number] Patient Account Number: [id number] Date of Birth/Sex: Treating RN: 02/28/1946 (74 y.o. [name] Other Clinician: Referring Provider: Treating Provider/Extender: Adele Schilder in Treatment: 3 History of Present Illness HPI Description: 01/17/2020 upon evaluation today patient presents for initial evaluation here in our clinic concerning issues she has been having with a left medial/plantar foot ulcer. This is actually been an issue for her since October 2020. She has been seeing Dr. Doran Durand for quite some time during that course. Fortunately there is no signs of active infection at this time. Or least no mention of this to have seen in general. With that being said unfortunately I do see some signs of erythema noted today that does have me concerned about the possibility of infection at this point in the surrounding area of the wound. There is also a warm to touch at the site which is somewhat concerning. Fortunately there is no evidence of systemic infection which is great news. The patient does have a history of diabetes mellitus type 2, Charcot foot which is what led to the wound, and hypertension. She notes that she was in a cast for  some time with Dr. Doran Durand for about 8 weeks. During that time they were utilizing according to the patient silver nitrate along with a foam doughnut and then Coban to secure in place in the cast in place. With that being said I do not have the actual records  to review we are going to try to get a hold of those unfortunately they would not flow over into care everywhere I did look today. She has been seeing Dr. Doran Durand and his physician assistant Larkin Ina up until the end of May and apparently is still seeing them on a regular basis every 2 weeks roughly. She has also tried Iodosorb without effect here. 01/24/2020 upon evaluation today patient actually appears to be doing quite well with regard to her wounds. She has been tolerating the dressing changes without complication. Fortunately there is no signs of active infection spreading which is good news. Her culture did show signs of Staph aureus I did place her on Augmentin due to the erythema surrounding the wound. With that being said the wound does appear to be doing better she has her longer walking cast/boot and I think that is actually good for her for the time being. I am considering reinitiating total contact cast when she gets back from vacation but next week she will actually be out of town at [employer] she knows not to get in the water but she still obviously is planning to enjoy herself she is going to take it easy on her foot however. 02/07/2020 upon evaluation today patient appears to be doing fairly well in regard to her ulcer on her foot. Fortunately there is no signs of severe infection at this time which is great news and overall very pleased in that regard. With that being said I do think that she could still benefit from a total contact cast. Nonetheless she is using her walking boot which at least provide some protection and that it prevents some of the friction occurring when she is ambulating. Electronic Signature(s) Signed: 02/07/2020  5:33:20 PM By: Worthy Keeler PA-C Entered By: Worthy Keeler on 02/07/2020 17:33:20 -------------------------------------------------------------------------------- Physical Exam Details Patient Name: Date of Service: [name] [name], [name] RA E. 02/07/2020 2:00 PM Medical Record Number: [id number] Patient Account Number: [id number] Date of Birth/Sex: Treating RN: 18-Feb-1946 (74 y.o. [name] Other Clinician: Referring Provider: Treating Provider/Extender: Landis Martins Weeks in Treatment: 3 Constitutional Well-nourished and well-hydrated in no acute distress. Respiratory normal breathing without difficulty. Psychiatric this patient is able to make decisions and demonstrates good insight into disease process. Alert and Oriented x 3. pleasant and cooperative. Notes Upon inspection patient's wound bed actually showed signs of good granulation at this time. There does not appear to be any evidence of active infection there was some minimal slough noted I did perform sharp debridement to clear slough as well as callus from around the edges of the wound and overall this appears to be doing much better which is great news is just not significantly smaller compared to where we started slightly so only. Electronic Signature(s) Signed: 02/07/2020 5:33:46 PM By: Worthy Keeler PA-C Entered By: Worthy Keeler on 02/07/2020 17:33:45 -------------------------------------------------------------------------------- Physician Orders Details Patient Name: Date of Service: [name] [name], [name] RA E. 02/07/2020 2:00 PM Medical Record Number: [id number] Patient Account Number: [id number] Date of Birth/Sex: Treating RN: [birth year] (74 y.o. [name] Other Clinician: Referring Provider: Treating Provider/Extender: Adele Schilder in Treatment: 3 Verbal / Phone Orders: No Diagnosis Coding ICD-10  Coding Code Description E11.621 Type 2 diabetes mellitus with foot ulcer L97.522 Non-pressure chronic ulcer of other part of left foot with fat layer exposed M14.672 Charcot's joint, left ankle and foot E11.40  Type 2 diabetes mellitus with diabetic neuropathy, unspecified I10 Essential (primary) hypertension Follow-up Appointments Return Appointment in 1 week. Dressing Change Frequency Wound #1 Left,Medial,Plantar Foot Change Dressing every other day. Wound Cleansing Wound #1 Left,Medial,Plantar Foot May shower and wash wound with soap and water. Primary Wound Dressing Wound #1 Left,Medial,Plantar Foot Hydrofera Blue - classic moistened with saline cut to fit inside wound margins Secondary Dressing Wound #1 Left,Medial,Plantar Foot Foam - foam donut Kerlix/Rolled Gauze Dry Gauze Edema Control Avoid standing for long periods of time Elevate legs to the level of the heart or above for 30 minutes daily and/or when sitting, a frequency of: Off-Loading Removable cast walker boot to: - left foot Electronic Signature(s) Signed: 02/07/2020 5:49:12 PM By: [name] RN, BSN Signed: 02/07/2020 5:59:30 PM By: Worthy Keeler PA-C Entered By: [name] on 02/07/2020 15:20:25 -------------------------------------------------------------------------------- Problem List Details Patient Name: Date of Service: [name] Guarneri RBA RA E. 02/07/2020 2:00 PM Medical Record Number: [id number] Patient Account Number: [id number] Date of Birth/Sex: Treating RN: 10/08/1945 (74 y.o. [name] Other Clinician: Referring Provider: Treating Provider/Extender: Landis Martins Weeks in Treatment: 3 Active Problems ICD-10 Encounter Code Description Active Date MDM Diagnosis E11.621 Type 2 diabetes mellitus with foot ulcer 01/17/2020 No Yes L97.522 Non-pressure chronic ulcer of other part of left foot with fat layer exposed 01/17/2020 No  Yes M14.672 Charcot's joint, left ankle and foot 01/17/2020 No Yes E11.40 Type 2 diabetes mellitus with diabetic neuropathy, unspecified 01/17/2020 No Yes I10 Essential (primary) hypertension 01/17/2020 No Yes Inactive Problems Resolved Problems Electronic Signature(s) Signed: 02/07/2020 2:12:19 PM By: Worthy Keeler PA-C Entered By: Worthy Keeler on 02/07/2020 14:12:19 -------------------------------------------------------------------------------- Progress Note Details Patient Name: Date of Service: [name] [name], [name] RA E. 02/07/2020 2:00 PM Medical Record Number: [id number] Patient Account Number: [id number] Date of Birth/Sex: Treating RN: October 05, 1945 (74 y.o. [name] Other Clinician: Referring Provider: Treating Provider/Extender: Adele Schilder in Treatment: 3 Subjective Chief Complaint Information obtained from Patient Left foot ulcer History of Present Illness (HPI) 01/17/2020 upon evaluation today patient presents for initial evaluation here in our clinic concerning issues she has been having with a left medial/plantar foot ulcer. This is actually been an issue for her since October 2020. She has been seeing Dr. Doran Durand for quite some time during that course. Fortunately there is no signs of active infection at this time. Or least no mention of this to have seen in general. With that being said unfortunately I do see some signs of erythema noted today that does have me concerned about the possibility of infection at this point in the surrounding area of the wound. There is also a warm to touch at the site which is somewhat concerning. Fortunately there is no evidence of systemic infection which is great news. The patient does have a history of diabetes mellitus type 2, Charcot foot which is what led to the wound, and hypertension. She notes that she was in a cast for some time with Dr. Doran Durand for about 8 weeks. During that  time they were utilizing according to the patient silver nitrate along with a foam doughnut and then Coban to secure in place in the cast in place. With that being said I do not have the actual records to review we are going to try to get a hold of those unfortunately they would not flow over into care everywhere I did look  today. She has been seeing Dr. Doran Durand and his physician assistant Larkin Ina up until the end of May and apparently is still seeing them on a regular basis every 2 weeks roughly. She has also tried Iodosorb without effect here. 01/24/2020 upon evaluation today patient actually appears to be doing quite well with regard to her wounds. She has been tolerating the dressing changes without complication. Fortunately there is no signs of active infection spreading which is good news. Her culture did show signs of Staph aureus I did place her on Augmentin due to the erythema surrounding the wound. With that being said the wound does appear to be doing better she has her longer walking cast/boot and I think that is actually good for her for the time being. I am considering reinitiating total contact cast when she gets back from vacation but next week she will actually be out of town at [employer] she knows not to get in the water but she still obviously is planning to enjoy herself she is going to take it easy on her foot however. 02/07/2020 upon evaluation today patient appears to be doing fairly well in regard to her ulcer on her foot. Fortunately there is no signs of severe infection at this time which is great news and overall very pleased in that regard. With that being said I do think that she could still benefit from a total contact cast. Nonetheless she is using her walking boot which at least provide some protection and that it prevents some of the friction occurring when she is ambulating. Objective Constitutional Well-nourished and well-hydrated in no acute distress. Vitals Time  Taken: 2:40 PM, Height: 66 in, Weight: 245 lbs, BMI: 39.5, Temperature: 98.4 F, Pulse: 97 bpm, Respiratory Rate: 18 breaths/min, Blood Pressure: 152/76 mmHg. Respiratory normal breathing without difficulty. Psychiatric this patient is able to make decisions and demonstrates good insight into disease process. Alert and Oriented x 3. pleasant and cooperative. General Notes: Upon inspection patient's wound bed actually showed signs of good granulation at this time. There does not appear to be any evidence of active infection there was some minimal slough noted I did perform sharp debridement to clear slough as well as callus from around the edges of the wound and overall this appears to be doing much better which is great news is just not significantly smaller compared to where we started slightly so only. Integumentary (Hair, Skin) Wound #1 status is Open. Original cause of wound was Gradually Appeared. The wound is located on the [place]. The wound measures 1.1cm length x 1.2cm width x 0.3cm depth; 1.037cm^2 area and 0.311cm^3 volume. There is Fat Layer (Subcutaneous Tissue) Exposed exposed. There is no tunneling or undermining noted. There is a medium amount of serosanguineous drainage noted. The wound margin is thickened. There is large (67-100%) pink granulation within the wound bed. There is a small (1-33%) amount of necrotic tissue within the wound bed including Adherent Slough. Assessment Active Problems ICD-10 Type 2 diabetes mellitus with foot ulcer Non-pressure chronic ulcer of other part of left foot with fat layer exposed Charcot's joint, left ankle and foot Type 2 diabetes mellitus with diabetic neuropathy, unspecified Essential (primary) hypertension Procedures Wound #1 Pre-procedure diagnosis of Wound #1 is a Diabetic Wound/Ulcer of the Lower Extremity located on the Left,Medial,Plantar Foot .Severity of Tissue Pre Debridement is: Fat layer exposed. There was  a Excisional Skin/Subcutaneous Tissue Debridement with a total area of 2.25 sq cm performed by Worthy Keeler, PA. With  the following instrument(s): Curette to remove Viable and Non-Viable tissue/material. Material removed includes Callus, Subcutaneous Tissue, and Skin: Epidermis after achieving pain control using Lidocaine 4% Topical Solution. No specimens were taken. A time out was conducted at 15:10, prior to the start of the procedure. A Minimum amount of bleeding was controlled with Pressure. The procedure was tolerated well with a pain level of 0 throughout and a pain level of 0 following the procedure. Post Debridement Measurements: 1.1cm length x 1.2cm width x 0.3cm depth; 0.311cm^3 volume. Character of Wound/Ulcer Post Debridement is improved. Severity of Tissue Post Debridement is: Fat layer exposed. Post procedure Diagnosis Wound #1: Same as Pre-Procedure Plan Follow-up Appointments: Return Appointment in 1 week. Dressing Change Frequency: Wound #1 Left,Medial,Plantar Foot: Change Dressing every other day. Wound Cleansing: Wound #1 Left,Medial,Plantar Foot: May shower and wash wound with soap and water. Primary Wound Dressing: Wound #1 Left,Medial,Plantar Foot: Hydrofera Blue - classic moistened with saline cut to fit inside wound margins Secondary Dressing: Wound #1 Left,Medial,Plantar Foot: Foam - foam donut Kerlix/Rolled Gauze Dry Gauze Edema Control: Avoid standing for long periods of time Elevate legs to the level of the heart or above for 30 minutes daily and/or when sitting, a frequency of: Off-Loading: Removable cast walker boot to: - left foot 1. I would recommend currently that we continue with the [facility] dressing will still see how things are doing over the next week. 2. I did suggest as well that the patient continue to use a foam doughnut along with roll gauze to secure in place. 3. I am also going to suggest that we still could consider doing the total  contact cast I think that would not be a bad idea for her and I think there is a great chance that if we do that we should be able to get this to heal much more quickly. Nonetheless she is a little reluctant to proceed with that yet regard therefore hold off for a week we will see where things stand in a week's time and if she wants to initiate the cast at that point we can do so then. She is in agreement with that plan. We will see patient back for reevaluation in 1 week here in the clinic. If anything worsens or changes patient will contact our office for additional recommendations. Electronic Signature(s) Signed: 02/07/2020 5:34:38 PM By: Worthy Keeler PA-C Entered By: Worthy Keeler on 02/07/2020 17:34:38 -------------------------------------------------------------------------------- SuperBill Details Patient Name: Date of Service: [place], [name] RA E. 02/07/2020 Medical Record Number: [id number] Patient Account Number: [id number] Date of Birth/Sex: Treating RN: [birth year] (74 y.o. [name] Other Clinician: Referring Provider: Treating Provider/Extender: Landis Martins Weeks in Treatment: 3 Diagnosis Coding ICD-10 Codes Code Description E11.621 Type 2 diabetes mellitus with foot ulcer L97.522 Non-pressure chronic ulcer of other part of left foot with fat layer exposed M14.672 Charcot's joint, left ankle and foot E11.40 Type 2 diabetes mellitus with diabetic neuropathy, unspecified I10 Essential (primary) hypertension Facility Procedures CPT4 Code: [id number] Description: 02637 - DEB SUBQ TISSUE 20 SQ CM/< ICD-10 Diagnosis Description L97.522 Non-pressure chronic ulcer of other part of left foot with fat layer exposed Modifier: Quantity: 1 Physician Procedures : CPT4 Code Description Modifier [id number] 11042 - WC PHYS SUBQ TISS 20 SQ CM ICD-10 Diagnosis Description L97.522 Non-pressure chronic ulcer of other part of left foot  with fat layer exposed Quantity: 1 Electronic Signature(s) Signed: 02/07/2020 5:34:48 PM By: Worthy Keeler  PA-C Entered By: Worthy Keeler on 02/07/2020 17:34:48

## 2020-02-07 NOTE — Progress Notes (Signed)
[name], [name] ([id number]) Visit Report for 02/07/2020 Arrival Information Details Patient Name: Date of Service: [name] [name], [name] E. 02/07/2020 2:00 PM Medical Record Number: [id number] Patient Account Number: [id number] Date of Birth/Sex: Treating RN: September 06, 1945 (74 y.o. [name] [name] Primary [name] [name] [name]: [name] [name] Other Clinician: Referring [name] [name]: Treating [name] [name]: [name] [name] in Treatment: 3 Visit Information History Since Last Visit All ordered tests and consults were completed: No Patient Arrived: [name] [name] Added or deleted any medications: No Arrival Time: 14:32 Any new allergies or adverse reactions: No Accompanied By: self Had a fall or experienced change in No Transfer Assistance: None activities of daily living that may affect Patient Identification Verified: Yes risk of falls: Secondary Verification Process Completed: Yes Signs or symptoms of abuse/neglect since last visito No Patient Requires Transmission-Based Precautions: No Hospitalized since last visit: No Patient Has Alerts: No Implantable device outside of the clinic excluding No cellular tissue based products placed in the center since last visit: Has Dressing in Place as Prescribed: Yes Has Compression in Place as Prescribed: Yes Pain Present Now: Yes Electronic Signature(s) Signed: 02/07/2020 5:31:10 PM By: [name] Coria RN Entered By: [name] [name] on 02/07/2020 14:40:38 -------------------------------------------------------------------------------- Lower Extremity Assessment Details Patient Name: Date of Service: [name] [name], [name] E. 02/07/2020 2:00 PM Medical Record Number: [id number] Patient Account Number: [id number] Date of Birth/Sex: Treating RN: 1946/01/11 (74 y.o. [name] [name] Primary [name] [name] [name]: [name] [name] Other Clinician: Referring [name] [name]: Treating [name] [name]: [name] [name] Weeks in Treatment: 3 Edema Assessment Assessed:  [Left: No] [Right: No] Edema: [Left: Ye] [Right: s] Calf Left: Right: Point of Measurement: 35 cm From Medial Instep 37 cm cm Ankle Left: Right: Point of Measurement: 9 cm From Medial Instep 26 cm cm Electronic Signature(s) Signed: 02/07/2020 5:31:10 PM By: [name] Coria RN Entered By: [name] [name] on 02/07/2020 14:45:51 -------------------------------------------------------------------------------- Multi-Disciplinary [name] Plan Details Patient Name: Date of Service: [name] Amato RA E. 02/07/2020 2:00 PM Medical Record Number: [id number] Patient Account Number: [id number] Date of Birth/Sex: Treating RN: March 27, 1946 (74 y.o. [name] [name] [name] [name]: [name] [name] Other Clinician: Referring [name] [name]: Treating [name] [name]: [name] [name] in Treatment: 3 Active Inactive Abuse / Safety / Falls / Self [name] Management Nursing Diagnoses: Potential for falls Goals: Patient/caregiver will verbalize/demonstrate measures taken to prevent injury and/or falls Date Initiated: 01/17/2020 Target Resolution Date: 02/14/2020 Goal Status: Active Interventions: Assess fall risk on admission and as needed Assess: immobility, friction, shearing, incontinence upon admission and as needed Notes: Nutrition Nursing Diagnoses: Impaired glucose control: actual or potential Potential for alteratiion in Nutrition/Potential for imbalanced nutrition Goals: Patient/caregiver verbalizes understanding of need to maintain therapeutic glucose control per primary [name] physician Date Initiated: 01/17/2020 Target Resolution Date: 02/14/2020 Goal Status: Active Patient/caregiver will maintain therapeutic glucose control Date Initiated: 01/17/2020 Target Resolution Date: 02/14/2020 Goal Status: Active Interventions: Assess HgA1c results as ordered upon admission and as needed Assess patient nutrition upon admission and as needed per policy Provide education on elevated blood  sugars and impact on wound healing Treatment Activities: Patient referred to Primary [name] Physician for further nutritional evaluation : 01/17/2020 Notes: Wound/Skin Impairment Nursing Diagnoses: Impaired tissue integrity Knowledge deficit related to ulceration/compromised skin integrity Goals: Patient/caregiver will verbalize understanding of skin [name] regimen Date Initiated: 01/17/2020 Target Resolution Date: 02/14/2020 Goal Status: Active Ulcer/skin breakdown will have a volume reduction of 30% by week 4 Date Initiated: 01/17/2020 Target Resolution Date: 02/14/2020 Goal Status: Active Interventions: Assess patient/caregiver ability  to obtain necessary supplies Assess patient/caregiver ability to perform ulcer/skin [name] regimen upon admission and as needed Assess ulceration(s) every visit Provide education on ulcer and skin [name] Treatment Activities: Skin [name] regimen initiated : 01/17/2020 Topical wound management initiated : 01/17/2020 Notes: Electronic Signature(s) Signed: 02/07/2020 5:49:12 PM By: [name] [name] Entered By: [name] [name] on 02/07/2020 15:07:04 -------------------------------------------------------------------------------- Pain Assessment Details Patient Name: Date of Service: [name] Amato RA E. 02/07/2020 2:00 PM Medical Record Number: [id number] Patient Account Number: [id number] Date of Birth/Sex: Treating RN: 1946-02-22 (74 y.o. [name] [name] Primary [name] [name] [name]: [name] [name] Other Clinician: Referring [name] [name]: Treating [name] [name]: [name] [name] in Treatment: 3 Active Problems Location of Pain Severity and Description of Pain Patient Has Paino Yes Site Locations Pain Location: Pain in Ulcers With Dressing Change: Yes Duration of the Pain. Constant / Intermittento Intermittent How Long Does it Lasto Hours: Minutes: 10 Rate the pain. Current Pain Level: 0 Worst Pain Level: 5 Least Pain Level: 0 Tolerable  Pain Level: 5 Character of Pain Describe the Pain: Shooting Pain Management and Medication Current Pain Management: Medication: No Cold Application: No Rest: Yes Massage: No Activity: No T.[name]: No Heat Application: No Leg drop or elevation: No Is the Current Pain Management Adequate: Inadequate How does your wound impact your activities of daily livingo Sleep: Yes Bathing: No Appetite: No Relationship With Others: No Bladder Continence: No Emotions: No Bowel Continence: No Work: No Toileting: No Drive: No Dressing: No Hobbies: No Electronic Signature(s) Signed: 02/07/2020 5:31:10 PM By: [name] Coria RN Entered By: [name] [name] on 02/07/2020 14:41:50 -------------------------------------------------------------------------------- Patient/Caregiver Education Details Patient Name: Date of Service: [name] Amato RA E. 6/30/2021andnbsp2:00 PM Medical Record Number: [id number] Patient Account Number: [id number] Date of Birth/Gender: Treating RN: [birth year] (74 y.o. [name] [name] Physician: [name] [name] Other Clinician: Referring Physician: Treating Physician/Extender: [name] [name] in Treatment: 3 Education Assessment Education Provided To: Patient Education Topics Provided Infection: Methods: Explain/Verbal Responses: Reinforcements needed, State content correctly Offloading: Methods: Explain/Verbal Responses: Reinforcements needed, State content correctly Wound/Skin Impairment: Methods: Explain/Verbal Responses: Reinforcements needed, State content correctly Electronic Signature(s) Signed: 02/07/2020 5:49:12 PM By: [name] [name] Entered By: [name] [name] on 02/07/2020 15:07:51 -------------------------------------------------------------------------------- Wound Assessment Details Patient Name: Date of Service: [name] Amato RA E. 02/07/2020 2:00 PM Medical Record Number: [id number] Patient Account Number:  [id number] Date of Birth/Sex: Treating RN: 1945/12/01 (74 y.o. [name] [name] Primary [name] Shahidah Nesbitt: [name] [name] Other Clinician: Referring Doctor Sheahan: Treating Adrienne Trombetta/Extender: [name] [name] Weeks in Treatment: 3 Wound Status Wound Number: 1 Primary Diabetic Wound/Ulcer of the Lower Extremity Etiology: Wound Location: Left, Medial, Plantar Foot Wound Open Wounding Event: Gradually Appeared Status: Date Acquired: 05/11/2019 Comorbid Sleep Apnea, Deep Vein Thrombosis, Hypertension, Colitis, Type Weeks Of Treatment: 3 History: II Diabetes, Gout, Osteoarthritis, Neuropathy Clustered Wound: No Wound Measurements Length: (cm) 1.1 Width: (cm) 1.2 Depth: (cm) 0.3 Area: (cm) 1.037 Volume: (cm) 0.311 % Reduction in Area: -1.6% % Reduction in Volume: 23.8% Epithelialization: None Tunneling: No Undermining: No Wound Description Classification: Grade 2 Wound Margin: Thickened Exudate Amount: Medium Exudate Type: Serosanguineous Exudate Color: red, brown Foul Odor After Cleansing: No Slough/Fibrino Yes Wound Bed Granulation Amount: Large (67-100%) Exposed Structure Granulation Quality: Pink Fascia Exposed: No Necrotic Amount: Small (1-33%) Fat Layer (Subcutaneous Tissue) Exposed: Yes Necrotic Quality: Adherent Slough Tendon Exposed: No Muscle Exposed: No Joint Exposed: No Bone Exposed: No Electronic Signature(s) Signed: 02/07/2020 5:31:10 PM By: [name] Coria  RN Entered By: [name] [name] on 02/07/2020 14:46:31 -------------------------------------------------------------------------------- [facility] Details Patient Name: Date of Service: [name] [name], [name] E. 02/07/2020 2:00 PM Medical Record Number: [id number] Patient Account Number: [id number] Date of Birth/Sex: Treating RN: 11/18/1945 (74 y.o. [name] [name] Primary [name] Haley Fuerstenberg: [name] [name] Other Clinician: Referring Shakyra Mattera: Treating Zarianna Dicarlo/Extender: [name] [name] Weeks in  Treatment: 3 Vital Signs Time Taken: 14:40 Temperature (F): 98.4 Height (in): 66 Pulse (bpm): 97 Weight (lbs): 245 Respiratory Rate (breaths/min): 18 Body Mass Index (BMI): 39.5 Blood Pressure (mmHg): 152/76 Reference Range: 80 - 120 mg / dl Electronic Signature(s) Signed: 02/07/2020 5:31:10 PM By: [name] Coria RN Entered By: [name] [name] on 02/07/2020 14:41:07

## 2020-02-14 ENCOUNTER — Encounter (HOSPITAL_BASED_OUTPATIENT_CLINIC_OR_DEPARTMENT_OTHER): Payer: Medicare Other | Attending: Physician Assistant | Admitting: Physician Assistant

## 2020-02-14 DIAGNOSIS — M14672 Charcot's joint, left ankle and foot: Secondary | ICD-10-CM | POA: Insufficient documentation

## 2020-02-14 DIAGNOSIS — I10 Essential (primary) hypertension: Secondary | ICD-10-CM | POA: Diagnosis not present

## 2020-02-14 DIAGNOSIS — E1140 Type 2 diabetes mellitus with diabetic neuropathy, unspecified: Secondary | ICD-10-CM | POA: Diagnosis not present

## 2020-02-14 DIAGNOSIS — L97522 Non-pressure chronic ulcer of other part of left foot with fat layer exposed: Secondary | ICD-10-CM | POA: Diagnosis not present

## 2020-02-14 DIAGNOSIS — E11621 Type 2 diabetes mellitus with foot ulcer: Secondary | ICD-10-CM | POA: Diagnosis not present

## 2020-02-14 NOTE — Progress Notes (Addendum)
[name] [name], [name] [name] ([id number]) Visit Report for 02/14/2020 Chief Complaint Document Details Patient Name: Date of Service: WANZA, SZUMSKI RA E. 02/14/2020 3:00 PM Medical Record Number: [id number] Patient Account Number: [id number] Date of Birth/Sex: Treating RN: 1946-03-05 (74 y.o. [name] Other Clinician: Referring Provider: Treating Provider/Extender: Adele Schilder in Treatment: 4 Information Obtained from: Patient Chief Complaint Left foot ulcer Electronic Signature(s) Signed: 02/14/2020 3:02:11 PM By: Worthy Keeler PA-C Entered By: Worthy Keeler on 02/14/2020 15:02:10 -------------------------------------------------------------------------------- HPI Details Patient Name: Date of Service: [name] [name], [name] [name] RBA RA E. 02/14/2020 3:00 PM Medical Record Number: [id number] Patient Account Number: [id number] Date of Birth/Sex: Treating RN: 1945-09-22 (73 y.o. [name] Other Clinician: Referring Provider: Treating Provider/Extender: Adele Schilder in Treatment: 4 History of Present Illness HPI Description: 01/17/2020 upon evaluation today patient presents for initial evaluation here in our clinic concerning issues she has been having with a left medial/plantar foot ulcer. This is actually been an issue for her since October 2020. She has been seeing Dr. Doran Durand for quite some time during that course. Fortunately there is no signs of active infection at this time. Or least no mention of this to have seen in general. With that being said unfortunately I do see some signs of erythema noted today that does have me concerned about the possibility of infection at this point in the surrounding area of the wound. There is also a warm to touch at the site which is somewhat concerning. Fortunately there is no evidence of systemic infection which is great news. The patient does have a  history of diabetes mellitus type 2, Charcot foot which is what led to the wound, and hypertension. She notes that she was in a cast for some time with Dr. Doran Durand for about 8 weeks. During that time they were utilizing according to the patient silver nitrate along with a foam doughnut and then Coban to secure in place in the cast in place. With that being said I do not have the actual records to review we are going to try to get a hold of those unfortunately they would not flow over into care everywhere I did look today. She has been seeing Dr. Doran Durand and his physician assistant Larkin Ina up until the end of May and apparently is still seeing them on a regular basis every 2 weeks roughly. She has also tried Iodosorb without effect here. 01/24/2020 upon evaluation today patient actually appears to be doing quite well with regard to her wounds. She has been tolerating the dressing changes without complication. Fortunately there is no signs of active infection spreading which is good news. Her culture did show signs of Staph aureus I did place her on Augmentin due to the erythema surrounding the wound. With that being said the wound does appear to be doing better she has her longer walking cast/boot and I think that is actually good for her for the time being. I am considering reinitiating total contact cast when she gets back from vacation but next week she will actually be out of town at [employer] she knows not to get in the water but she still obviously is planning to enjoy herself she is going to take it easy on her foot however. 02/07/2020 upon evaluation today patient appears to be doing fairly well in regard to her ulcer on her foot. Fortunately there is no signs of  severe infection at this time which is great news and overall very pleased in that regard. With that being said I do think that she could still benefit from a total contact cast. Nonetheless she is using her walking boot which at least  provide some protection and that it prevents some of the friction occurring when she is ambulating. 02/14/2020 upon evaluation today patient appears to be doing well with regard to her foot ulcer. This is actually measuring a little bit smaller yet again this week. Overall very pleased with where things stand and I do not see any signs of active infection at this time which is also good news. Since she is measuring better the patient has wanting to somewhat hold off on proceeding with the total contact cast which I think is reasonable at this point. Electronic Signature(s) Signed: 02/14/2020 4:21:45 PM By: Worthy Keeler PA-C Signed: 02/14/2020 4:21:45 PM By: Worthy Keeler PA-C Entered By: Worthy Keeler on 02/14/2020 16:21:45 -------------------------------------------------------------------------------- Physical Exam Details Patient Name: Date of Service: [name] [name], [name] [name] RBA RA E. 02/14/2020 3:00 PM Medical Record Number: [id number] Patient Account Number: [id number] Date of Birth/Sex: Treating RN: [birth year] (74 y.o. [name] Other Clinician: Referring Provider: Treating Provider/Extender: Landis Martins Weeks in Treatment: 4 Constitutional Well-nourished and well-hydrated in no acute distress. Respiratory normal breathing without difficulty. Psychiatric this patient is able to make decisions and demonstrates good insight into disease process. Alert and Oriented x 3. pleasant and cooperative. Notes Patient's wound bed showed signs of good granulation there was no significant slough buildup no significant callus buildup and overall she is measuring somewhat better. I am very pleased in this regard and I think that we can continue as such right now with the current measures utilizing the [facility] along with her walking boot at this point using foam for offloading. Electronic Signature(s) Signed: 02/14/2020 4:22:09 PM By: Worthy Keeler PA-C Entered By: Worthy Keeler on 02/14/2020 16:22:09 -------------------------------------------------------------------------------- Physician Orders Details Patient Name: Date of Service: Benavides, [name] [name] RBA RA E. 02/14/2020 3:00 PM Medical Record Number: [id number] Patient Account Number: [id number] Date of Birth/Sex: Treating RN: 1945-10-02 (75 y.o. [name] Other Clinician: Referring Provider: Treating Provider/Extender: Adele Schilder in Treatment: 4 Verbal / Phone Orders: No Diagnosis Coding ICD-10 Coding Code Description E11.621 Type 2 diabetes mellitus with foot ulcer L97.522 Non-pressure chronic ulcer of other part of left foot with fat layer exposed M14.672 Charcot's joint, left ankle and foot E11.40 Type 2 diabetes mellitus with diabetic neuropathy, unspecified I10 Essential (primary) hypertension Follow-up Appointments Return Appointment in 2 weeks. Dressing Change Frequency Wound #1 Left,Medial,Plantar Foot Change Dressing every other day. Wound Cleansing Wound #1 Left,Medial,Plantar Foot May shower and wash wound with soap and water. Primary Wound Dressing Wound #1 Left,Medial,Plantar Foot Hydrofera Blue - classic moistened with saline cut to fit inside wound margins Secondary Dressing Wound #1 Left,Medial,Plantar Foot Foam - foam donut Kerlix/Rolled Gauze Dry Gauze Edema Control Avoid standing for long periods of time Elevate legs to the level of the heart or above for 30 minutes daily and/or when sitting, a frequency of: Off-Loading Removable cast walker boot to: - left foot Electronic Signature(s) Signed: 02/14/2020 5:11:14 PM By: Worthy Keeler PA-C Signed: 02/14/2020 5:28:41 PM By: [name] RN, BSN Entered By: [name] on 02/14/2020 16:19:05 -------------------------------------------------------------------------------- Problem List Details Patient Name: Date of  Service: [name] [name], [name] [name] RBA  RA E. 02/14/2020 3:00 PM Medical Record Number: [id number] Patient Account Number: [id number] Date of Birth/Sex: Treating RN: 11-01-1945 (74 y.o. [name] Other Clinician: Referring Provider: Treating Provider/Extender: Landis Martins Weeks in Treatment: 4 Active Problems ICD-10 Encounter Code Description Active Date MDM Diagnosis E11.621 Type 2 diabetes mellitus with foot ulcer 01/17/2020 No Yes L97.522 Non-pressure chronic ulcer of other part of left foot with fat layer exposed 01/17/2020 No Yes M14.672 Charcot's joint, left ankle and foot 01/17/2020 No Yes E11.40 Type 2 diabetes mellitus with diabetic neuropathy, unspecified 01/17/2020 No Yes I10 Essential (primary) hypertension 01/17/2020 No Yes Inactive Problems Resolved Problems Electronic Signature(s) Signed: 02/14/2020 3:00:54 PM By: Worthy Keeler PA-C Entered By: Worthy Keeler on 02/14/2020 15:00:53 -------------------------------------------------------------------------------- Progress Note Details Patient Name: Date of Service: Haughey, [name] [name] RBA RA E. 02/14/2020 3:00 PM Medical Record Number: [id number] Patient Account Number: [id number] Date of Birth/Sex: Treating RN: 07-21-1946 (74 y.o. [name] Other Clinician: Referring Provider: Treating Provider/Extender: Adele Schilder in Treatment: 4 Subjective Chief Complaint Information obtained from Patient Left foot ulcer History of Present Illness (HPI) 01/17/2020 upon evaluation today patient presents for initial evaluation here in our clinic concerning issues she has been having with a left medial/plantar foot ulcer. This is actually been an issue for her since October 2020. She has been seeing Dr. Doran Durand for quite some time during that course. Fortunately there is no signs of active infection at this time. Or least no mention of this to  have seen in general. With that being said unfortunately I do see some signs of erythema noted today that does have me concerned about the possibility of infection at this point in the surrounding area of the wound. There is also a warm to touch at the site which is somewhat concerning. Fortunately there is no evidence of systemic infection which is great news. The patient does have a history of diabetes mellitus type 2, Charcot foot which is what led to the wound, and hypertension. She notes that she was in a cast for some time with Dr. Doran Durand for about 8 weeks. During that time they were utilizing according to the patient silver nitrate along with a foam doughnut and then Coban to secure in place in the cast in place. With that being said I do not have the actual records to review we are going to try to get a hold of those unfortunately they would not flow over into care everywhere I did look today. She has been seeing Dr. Doran Durand and his physician assistant Larkin Ina up until the end of May and apparently is still seeing them on a regular basis every 2 weeks roughly. She has also tried Iodosorb without effect here. 01/24/2020 upon evaluation today patient actually appears to be doing quite well with regard to her wounds. She has been tolerating the dressing changes without complication. Fortunately there is no signs of active infection spreading which is good news. Her culture did show signs of Staph aureus I did place her on Augmentin due to the erythema surrounding the wound. With that being said the wound does appear to be doing better she has her longer walking cast/boot and I think that is actually good for her for the time being. I am considering reinitiating total contact cast when she gets back from vacation but next week she will actually be out of town at [employer] she  knows not to get in the water but she still obviously is planning to enjoy herself she is going to take it easy on her foot  however. 02/07/2020 upon evaluation today patient appears to be doing fairly well in regard to her ulcer on her foot. Fortunately there is no signs of severe infection at this time which is great news and overall very pleased in that regard. With that being said I do think that she could still benefit from a total contact cast. Nonetheless she is using her walking boot which at least provide some protection and that it prevents some of the friction occurring when she is ambulating. 02/14/2020 upon evaluation today patient appears to be doing well with regard to her foot ulcer. This is actually measuring a little bit smaller yet again this week. Overall very pleased with where things stand and I do not see any signs of active infection at this time which is also good news. Since she is measuring better the patient has wanting to somewhat hold off on proceeding with the total contact cast which I think is reasonable at this point. Objective Constitutional Well-nourished and well-hydrated in no acute distress. Vitals Time Taken: 3:26 PM, Height: 66 in, Weight: 245 lbs, BMI: 39.5, Temperature: 98.2 F, Pulse: 109 bpm, Respiratory Rate: 19 breaths/min, Blood Pressure: 148/82 mmHg. Respiratory normal breathing without difficulty. Psychiatric this patient is able to make decisions and demonstrates good insight into disease process. Alert and Oriented x 3. pleasant and cooperative. General Notes: Patient's wound bed showed signs of good granulation there was no significant slough buildup no significant callus buildup and overall she is measuring somewhat better. I am very pleased in this regard and I think that we can continue as such right now with the current measures utilizing the [facility] along with her walking boot at this point using foam for offloading. Integumentary (Hair, Skin) Wound #1 status is Open. Original cause of wound was Gradually Appeared. The wound is located on the  [place]. The wound measures 0.8cm length x 0.9cm width x 0.2cm depth; 0.565cm^2 area and 0.113cm^3 volume. There is Fat Layer (Subcutaneous Tissue) Exposed exposed. There is no tunneling or undermining noted. There is a medium amount of serosanguineous drainage noted. The wound margin is thickened. There is large (67-100%) red, pink granulation within the wound bed. There is no necrotic tissue within the wound bed. Assessment Active Problems ICD-10 Type 2 diabetes mellitus with foot ulcer Non-pressure chronic ulcer of other part of left foot with fat layer exposed Charcot's joint, left ankle and foot Type 2 diabetes mellitus with diabetic neuropathy, unspecified Essential (primary) hypertension Plan Follow-up Appointments: Return Appointment in 2 weeks. Dressing Change Frequency: Wound #1 Left,Medial,Plantar Foot: Change Dressing every other day. Wound Cleansing: Wound #1 Left,Medial,Plantar Foot: May shower and wash wound with soap and water. Primary Wound Dressing: Wound #1 Left,Medial,Plantar Foot: Hydrofera Blue - classic moistened with saline cut to fit inside wound margins Secondary Dressing: Wound #1 Left,Medial,Plantar Foot: Foam - foam donut Kerlix/Rolled Gauze Dry Gauze Edema Control: Avoid standing for long periods of time Elevate legs to the level of the heart or above for 30 minutes daily and/or when sitting, a frequency of: Off-Loading: Removable cast walker boot to: - left foot 1. At this time I did recommend that we go ahead and initiate treatment with a continuation of the [facility] that seems to be doing well. 2. We will also use double layer of foam over top of this to  help offload. That seems to be beneficial. She is also using her cam walker boot. 3. I would recommend she continue to monitor for any signs of worsening or infection although right now she seems to be doing quite well. We will see patient back for reevaluation in 2 weeks  here in the clinic. If anything worsens or changes patient will contact our office for additional recommendations. Electronic Signature(s) Signed: 02/14/2020 4:50:45 PM By: Worthy Keeler PA-C Entered By: Worthy Keeler on 02/14/2020 16:50:44 -------------------------------------------------------------------------------- SuperBill Details Patient Name: Date of Service: [name] [name], [name] [name] RBA RA E. 02/14/2020 Medical Record Number: [id number] Patient Account Number: [id number] Date of Birth/Sex: Treating RN: Jan 26, 1946 (74 y.o. [name] Other Clinician: Referring Provider: Treating Provider/Extender: Landis Martins Weeks in Treatment: 4 Diagnosis Coding ICD-10 Codes Code Description E11.621 Type 2 diabetes mellitus with foot ulcer L97.522 Non-pressure chronic ulcer of other part of left foot with fat layer exposed M14.672 Charcot's joint, left ankle and foot E11.40 Type 2 diabetes mellitus with diabetic neuropathy, unspecified I10 Essential (primary) hypertension Facility Procedures CPT4 Code: [id number] Description: 99213 - WOUND CARE VISIT-LEV 3 EST PT Modifier: Quantity: 1 Physician Procedures : CPT4 Code Description Modifier [id number] 99213 - WC PHYS LEVEL 3 - EST PT ICD-10 Diagnosis Description E11.621 Type 2 diabetes mellitus with foot ulcer L97.522 Non-pressure chronic ulcer of other part of left foot with fat layer exposed M14.672 Charcot's  joint, left ankle and foot E11.40 Type 2 diabetes mellitus with diabetic neuropathy, unspecified Quantity: 1 Electronic Signature(s) Signed: 02/14/2020 4:50:55 PM By: Worthy Keeler PA-C Entered By: Worthy Keeler on 02/14/2020 16:50:55

## 2020-02-14 NOTE — Progress Notes (Signed)
VERONIA, LAPRISE ([id number]) Visit Report for 02/14/2020 Arrival Information Details Patient Name: Date of Service: GENNESIS, HOGLAND RA E. 02/14/2020 3:00 PM Medical Record Number: [id number] Patient Account Number: [id number] Date of Birth/Sex: Treating RN: [birth year] (74 y.o. [name] Primary Care Mykia Holton: [name] Other Clinician: Referring Nyaisha Simao: Treating Juliona Vales/Extender: Adele Schilder in Treatment: 4 Visit Information History Since Last Visit Added or deleted any medications: No Patient Arrived: [name] Any new allergies or adverse reactions: No Arrival Time: 15:24 Had a fall or experienced change in No Accompanied By: self activities of daily living that may affect Transfer Assistance: None risk of falls: Patient Identification Verified: Yes Signs or symptoms of abuse/neglect since last visito No Secondary Verification Process Completed: Yes Hospitalized since last visit: No Patient Requires Transmission-Based Precautions: No Implantable device outside of the clinic excluding No Patient Has Alerts: No cellular tissue based products placed in the center since last visit: Has Dressing in Place as Prescribed: Yes Has Footwear/Offloading in Place as Prescribed: Yes Left: Other:boot Pain Present Now: No Electronic Signature(s) Signed: 02/14/2020 5:12:52 PM By: Kela Millin Entered By: Kela Millin on 02/14/2020 15:26:26 -------------------------------------------------------------------------------- Clinic Level of Care Assessment Details Patient Name: Date of Service: MAMTA, RIMMER RA E. 02/14/2020 3:00 PM Medical Record Number: [id number] Patient Account Number: [id number] Date of Birth/Sex: Treating RN: Jan 21, 1946 (74 y.o. [name] Primary Care Leva Baine: [name] Other Clinician: Referring Meilyn Heindl: Treating Kevonte Vanecek/Extender: Adele Schilder in Treatment: 4 Clinic Level of Care Assessment  Items TOOL 4 Quantity Score '[]'$  - 0 Use when only an EandM is performed on FOLLOW-UP visit ASSESSMENTS - Nursing Assessment / Reassessment X- 1 10 Reassessment of Co-morbidities (includes updates in patient status) X- 1 5 Reassessment of Adherence to Treatment Plan ASSESSMENTS - Wound and Skin A ssessment / Reassessment X - Simple Wound Assessment / Reassessment - one wound 1 5 '[]'$  - 0 Complex Wound Assessment / Reassessment - multiple wounds '[]'$  - 0 Dermatologic / Skin Assessment (not related to wound area) ASSESSMENTS - Focused Assessment '[]'$  - 0 Circumferential Edema Measurements - multi extremities '[]'$  - 0 Nutritional Assessment / Counseling / Intervention X- 1 5 Lower Extremity Assessment (monofilament, tuning fork, pulses) '[]'$  - 0 Peripheral Arterial Disease Assessment (using hand held doppler) ASSESSMENTS - Ostomy and/or Continence Assessment and Care '[]'$  - 0 Incontinence Assessment and Management '[]'$  - 0 Ostomy Care Assessment and Management (repouching, etc.) PROCESS - Coordination of Care X - Simple Patient / Family Education for ongoing care 1 15 '[]'$  - 0 Complex (extensive) Patient / Family Education for ongoing care X- 1 10 Staff obtains Programmer, systems, Records, T Results / Process Orders est '[]'$  - 0 Staff telephones HHA, Nursing Homes / Clarify orders / etc '[]'$  - 0 Routine Transfer to another Facility (non-emergent condition) '[]'$  - 0 Routine Hospital Admission (non-emergent condition) '[]'$  - 0 New Admissions / Biomedical engineer / Ordering NPWT Apligraf, etc. , '[]'$  - 0 Emergency Hospital Admission (emergent condition) X- 1 10 Simple Discharge Coordination '[]'$  - 0 Complex (extensive) Discharge Coordination PROCESS - Special Needs '[]'$  - 0 Pediatric / Minor Patient Management '[]'$  - 0 Isolation Patient Management '[]'$  - 0 Hearing / Language / Visual special needs '[]'$  - 0 Assessment of Community assistance (transportation, D/C planning, etc.) '[]'$  - 0 Additional  assistance / Altered mentation '[]'$  - 0 Support Surface(s) Assessment (bed, cushion, seat, etc.) INTERVENTIONS - Wound Cleansing / Measurement X - Simple Wound Cleansing - one wound  1 5 '[]'$  - 0 Complex Wound Cleansing - multiple wounds X- 1 5 Wound Imaging (photographs - any number of wounds) '[]'$  - 0 Wound Tracing (instead of photographs) X- 1 5 Simple Wound Measurement - one wound '[]'$  - 0 Complex Wound Measurement - multiple wounds INTERVENTIONS - Wound Dressings X - Small Wound Dressing one or multiple wounds 1 10 '[]'$  - 0 Medium Wound Dressing one or multiple wounds '[]'$  - 0 Large Wound Dressing one or multiple wounds X- 1 5 Application of Medications - topical '[]'$  - 0 Application of Medications - injection INTERVENTIONS - Miscellaneous '[]'$  - 0 External ear exam '[]'$  - 0 Specimen Collection (cultures, biopsies, blood, body fluids, etc.) '[]'$  - 0 Specimen(s) / Culture(s) sent or taken to Lab for analysis '[]'$  - 0 Patient Transfer (multiple staff / Civil Service fast streamer / Similar devices) '[]'$  - 0 Simple Staple / Suture removal (25 or less) '[]'$  - 0 Complex Staple / Suture removal (26 or more) '[]'$  - 0 Hypo / Hyperglycemic Management (close monitor of Blood Glucose) '[]'$  - 0 Ankle / Brachial Index (ABI) - do not check if billed separately X- 1 5 Vital Signs Has the patient been seen at the hospital within the last three years: Yes Total Score: 95 Level Of Care: New/Established - Level 3 Electronic Signature(s) Signed: 02/14/2020 5:28:41 PM By: [name] RN, BSN Entered By: [name] on 02/14/2020 16:15:40 -------------------------------------------------------------------------------- Encounter Discharge Information Details Patient Name: Date of Service: [name] 02/14/2020 3:00 PM Medical Record Number: [id number] Patient Account Number: [id number] Date of Birth/Sex: Treating RN: 27-Feb-1946 (74 y.o. [name] Primary Care Asante Blanda: [name] Other  Clinician: Referring Jared Whorley: Treating Irini Leet/Extender: Adele Schilder in Treatment: 4 Encounter Discharge Information Items Discharge Condition: Stable Ambulatory Status: Cane Discharge Destination: Home Transportation: Private Auto Accompanied By: self Schedule Follow-up Appointment: Yes Clinical Summary of Care: Patient Declined Electronic Signature(s) Signed: 02/14/2020 5:12:52 PM By: Kela Millin Entered By: Kela Millin on 02/14/2020 16:44:38 -------------------------------------------------------------------------------- Lower Extremity Assessment Details Patient Name: Date of Service: [name] 02/14/2020 3:00 PM Medical Record Number: [id number] Patient Account Number: [id number] Date of Birth/Sex: Treating RN: 02/17/1946 (74 y.o. [name] Primary Care Deray Dawes: [name] Other Clinician: Referring Kasidi Shanker: Treating Accalia Rigdon/Extender: Landis Martins Weeks in Treatment: 4 Edema Assessment Assessed: [Left: No] [Right: No] Edema: [Left: Ye] [Right: s] Calf Left: Right: Point of Measurement: 35 cm From Medial Instep 41 cm cm Ankle Left: Right: Point of Measurement: 9 cm From Medial Instep 24.5 cm cm Vascular Assessment Pulses: Dorsalis Pedis Palpable: [Left:No] Electronic Signature(s) Signed: 02/14/2020 5:12:52 PM By: Kela Millin Entered By: Kela Millin on 02/14/2020 15:31:26 -------------------------------------------------------------------------------- Multi-Disciplinary Care Plan Details Patient Name: Date of Service: [name] 02/14/2020 3:00 PM Medical Record Number: [id number] Patient Account Number: [id number] Date of Birth/Sex: Treating RN: November 05, 1945 (74 y.o. [name] Primary Care Anvika Gashi: [name] Other Clinician: Referring Rahmah Mccamy: Treating Lajuanda Penick/Extender: Adele Schilder in Treatment: 4 Active Inactive Nutrition Nursing  Diagnoses: Impaired glucose control: actual or potential Potential for alteratiion in Nutrition/Potential for imbalanced nutrition Goals: Patient/caregiver verbalizes understanding of need to maintain therapeutic glucose control per primary care physician Date Initiated: 01/17/2020 Date Inactivated: 02/14/2020 Target Resolution Date: 02/14/2020 Goal Status: Met Patient/caregiver will maintain therapeutic glucose control Date Initiated: 01/17/2020 Target Resolution Date: 03/13/2020 Goal Status: Active Interventions: Assess HgA1c results as ordered upon admission and as needed Assess patient nutrition upon  admission and as needed per policy Provide education on elevated blood sugars and impact on wound healing Treatment Activities: Patient referred to Primary Care Physician for further nutritional evaluation : 01/17/2020 Notes: Wound/Skin Impairment Nursing Diagnoses: Impaired tissue integrity Knowledge deficit related to ulceration/compromised skin integrity Goals: Patient/caregiver will verbalize understanding of skin care regimen Date Initiated: 01/17/2020 Target Resolution Date: 03/13/2020 Goal Status: Active Ulcer/skin breakdown will have a volume reduction of 30% by week 4 Date Initiated: 01/17/2020 Date Inactivated: 02/14/2020 Target Resolution Date: 02/14/2020 Goal Status: Met Ulcer/skin breakdown will have a volume reduction of 50% by week 8 Date Initiated: 02/14/2020 Target Resolution Date: 03/13/2020 Goal Status: Active Interventions: Assess patient/caregiver ability to obtain necessary supplies Assess patient/caregiver ability to perform ulcer/skin care regimen upon admission and as needed Assess ulceration(s) every visit Provide education on ulcer and skin care Treatment Activities: Skin care regimen initiated : 01/17/2020 Topical wound management initiated : 01/17/2020 Notes: Electronic Signature(s) Signed: 02/14/2020 5:28:41 PM By: Zenaida Deed RN, BSN Entered By: Zenaida Deed on  02/14/2020 16:14:22 -------------------------------------------------------------------------------- Pain Assessment Details Patient Name: Date of Service: [name] RBA RA E. 02/14/2020 3:00 PM Medical Record Number: [id number] Patient Account Number: [id number] Date of Birth/Sex: Treating RN: [birth year] (74 y.o. Harvest Dark Primary Care Chayne Baumgart: Cheryll Cockayne Other Clinician: Referring Jacon Whetzel: Treating Shynia Daleo/Extender: Atlee Abide Weeks in Treatment: 4 Active Problems Location of Pain Severity and Description of Pain Patient Has Paino No Site Locations Pain Management and Medication Current Pain Management: Electronic Signature(s) Signed: 02/14/2020 5:12:52 PM By: Cherylin Mylar Entered By: Cherylin Mylar on 02/14/2020 15:28:58 -------------------------------------------------------------------------------- Patient/Caregiver Education Details Patient Name: Date of Service: Delton [place] RA E. 7/7/2021andnbsp3:00 PM Medical Record Number: [id number] Patient Account Number: [id number] Date of Birth/Gender: Treating RN: 22-Dec-1945 (74 y.o. [name] Primary Care Physician: Cheryll Cockayne Other Clinician: Referring Physician: Treating Physician/Extender: Rickard Patience in Treatment: 4 Education Assessment Education Provided To: Patient Education Topics Provided Elevated Blood Sugar/ Impact on Healing: Methods: Explain/Verbal Responses: Reinforcements needed, State content correctly Offloading: Methods: Explain/Verbal Responses: Reinforcements needed, State content correctly Wound/Skin Impairment: Methods: Explain/Verbal Responses: Reinforcements needed, State content correctly Electronic Signature(s) Signed: 02/14/2020 5:28:41 PM By: Zenaida Deed RN, BSN Entered By: Zenaida Deed on 02/14/2020 16:14:58 -------------------------------------------------------------------------------- Wound Assessment  Details Patient Name: Date of Service: Lingafelter, [name] 02/14/2020 3:00 PM Medical Record Number: [id number] Patient Account Number: [id number] Date of Birth/Sex: Treating RN: 06-02-1946 (74 y.o. Harvest Dark Primary Care Kaisha Wachob: Cheryll Cockayne Other Clinician: Referring Jasmane Brockway: Treating Erion Hermans/Extender: Atlee Abide Weeks in Treatment: 4 Wound Status Wound Number: 1 Primary Diabetic Wound/Ulcer of the Lower Extremity Etiology: Wound Location: Left, Medial, Plantar Foot Wound Open Wounding Event: Gradually Appeared Status: Date Acquired: 05/11/2019 Comorbid Sleep Apnea, Deep Vein Thrombosis, Hypertension, Colitis, Type Weeks Of Treatment: 4 History: II Diabetes, Gout, Osteoarthritis, Neuropathy Clustered Wound: No Wound Measurements Length: (cm) 0.8 Width: (cm) 0.9 Depth: (cm) 0.2 Area: (cm) 0.565 Volume: (cm) 0.113 % Reduction in Area: 44.7% % Reduction in Volume: 72.3% Epithelialization: None Tunneling: No Undermining: No Wound Description Classification: Grade 2 Wound Margin: Thickened Exudate Amount: Medium Exudate Type: Serosanguineous Exudate Color: red, brown Foul Odor After Cleansing: No Slough/Fibrino No Wound Bed Granulation Amount: Large (67-100%) Exposed Structure Granulation Quality: Red, Pink Fascia Exposed: No Necrotic Amount: None Present (0%) Fat Layer (Subcutaneous Tissue) Exposed: Yes Tendon Exposed: No Muscle Exposed: No Joint Exposed: No Bone Exposed: No Treatment Notes Wound #1 (Left, Medial, Plantar Foot)  1. Cleanse With Wound Cleanser 2. Periwound Care Skin Prep 3. Primary Dressing Applied Hydrofera Blue 4. Secondary Dressing Dry Gauze Roll Gauze Foam 5. Secured With Tape Notes normal saline for hydroferablue classic Electronic Signature(s) Signed: 02/14/2020 5:12:52 PM By: Kela Millin Entered By: Kela Millin on 02/14/2020  15:34:23 -------------------------------------------------------------------------------- Vitals Details Patient Name: Date of Service: Roehrs, [name] 02/14/2020 3:00 PM Medical Record Number: [id number] Patient Account Number: [id number] Date of Birth/Sex: Treating RN: August 22, 1945 (74 y.o. [name] Primary Care Latiana Tomei: [name] Other Clinician: Referring Kyrell Ruacho: Treating Preeya Cleckley/Extender: Landis Martins Weeks in Treatment: 4 Vital Signs Time Taken: 15:26 Temperature (F): 98.2 Height (in): 66 Pulse (bpm): 109 Weight (lbs): 245 Respiratory Rate (breaths/min): 19 Body Mass Index (BMI): 39.5 Blood Pressure (mmHg): 148/82 Reference Range: 80 - 120 mg / dl Electronic Signature(s) Signed: 02/14/2020 5:12:52 PM By: Kela Millin Entered By: Kela Millin on 02/14/2020 15:28:50

## 2020-02-22 DIAGNOSIS — L4059 Other psoriatic arthropathy: Secondary | ICD-10-CM | POA: Diagnosis not present

## 2020-02-28 ENCOUNTER — Encounter (HOSPITAL_BASED_OUTPATIENT_CLINIC_OR_DEPARTMENT_OTHER): Payer: Medicare Other | Admitting: Physician Assistant

## 2020-02-28 ENCOUNTER — Other Ambulatory Visit: Payer: Self-pay

## 2020-02-28 DIAGNOSIS — L97522 Non-pressure chronic ulcer of other part of left foot with fat layer exposed: Secondary | ICD-10-CM | POA: Diagnosis not present

## 2020-02-28 DIAGNOSIS — E11621 Type 2 diabetes mellitus with foot ulcer: Secondary | ICD-10-CM | POA: Diagnosis not present

## 2020-02-28 DIAGNOSIS — M14672 Charcot's joint, left ankle and foot: Secondary | ICD-10-CM | POA: Diagnosis not present

## 2020-02-28 DIAGNOSIS — I10 Essential (primary) hypertension: Secondary | ICD-10-CM | POA: Diagnosis not present

## 2020-02-28 DIAGNOSIS — E1140 Type 2 diabetes mellitus with diabetic neuropathy, unspecified: Secondary | ICD-10-CM | POA: Diagnosis not present

## 2020-02-28 NOTE — Progress Notes (Addendum)
[name] [name] ([id number]) Visit Report for 02/28/2020 Chief Complaint Document Details Patient Name: Date of Service: [name] [name], [name] [name] E. 02/28/2020 3:00 PM Medical Record Number: [id number] Patient Account Number: [id number] Date of Birth/Sex: Treating RN: June 05, 1946 (74 y.o. [name] [name] Primary Care Provider: [name] Other Clinician: Referring Provider: Treating Provider/Extender: Adele Schilder in Treatment: 6 Information Obtained from: Patient Chief Complaint Left foot ulcer Electronic Signature(s) Signed: 02/28/2020 3:17:14 PM By: Worthy Keeler PA-C Entered By: Worthy Keeler on 02/28/2020 15:17:14 -------------------------------------------------------------------------------- Debridement Details Patient Name: Date of Service: [name] [name] E. 02/28/2020 3:00 PM Medical Record Number: [id number] Patient Account Number: [id number] Date of Birth/Sex: Treating RN: 1946-02-24 (74 y.o. [name] [name] Primary Care Provider: [name] Other Clinician: Referring Provider: Treating Provider/Extender: Adele Schilder in Treatment: 6 Debridement Performed for Assessment: Wound #1 Left,Medial,Plantar Foot Performed By: Physician Worthy Keeler, PA Debridement Type: Debridement Severity of Tissue Pre Debridement: Fat layer exposed Level of Consciousness (Pre-procedure): Awake and Alert Pre-procedure Verification/Time Out Yes - 16:30 Taken: Start Time: 16:31 Pain Control: Lidocaine 4% T opical Solution T Area Debrided (L x W): otal 2 (cm) x 2 (cm) = 4 (cm) Tissue and other material debrided: Viable, Non-Viable, Callus, Slough, Subcutaneous, Skin: Epidermis, Slough Level: Skin/Subcutaneous Tissue Debridement Description: Excisional Instrument: Curette Bleeding: Minimum Hemostasis Achieved: Pressure End Time: 16:36 Procedural Pain: 0 Post Procedural Pain: 0 Response to Treatment: Procedure was tolerated well Level of  Consciousness (Post- Awake and Alert procedure): Post Debridement Measurements of Total Wound Length: (cm) 1.1 Width: (cm) 1.1 Depth: (cm) 0.3 Volume: (cm) 0.285 Character of Wound/Ulcer Post Debridement: Improved Severity of Tissue Post Debridement: Fat layer exposed Post Procedure Diagnosis Same as Pre-procedure Electronic Signature(s) Signed: 02/28/2020 6:54:26 PM By: Worthy Keeler PA-C Signed: 02/29/2020 4:56:58 PM By: [name] RN, BSN Entered By: [name] on 02/28/2020 16:35:31 -------------------------------------------------------------------------------- HPI Details Patient Name: Date of Service: [name] [name], [name] [name] E. 02/28/2020 3:00 PM Medical Record Number: [id number] Patient Account Number: [id number] Date of Birth/Sex: Treating RN: [birth year] (74 y.o. [name] [name] Primary Care Provider: [name] Other Clinician: Referring Provider: Treating Provider/Extender: Adele Schilder in Treatment: 6 History of Present Illness HPI Description: 01/17/2020 upon evaluation today patient presents for initial evaluation here in our clinic concerning issues she has been having with a left medial/plantar foot ulcer. This is actually been an issue for her since October 2020. She has been seeing Dr. Doran Durand for quite some time during that course. Fortunately there is no signs of active infection at this time. Or least no mention of this to have seen in general. With that being said unfortunately I do see some signs of erythema noted today that does have me concerned about the possibility of infection at this point in the surrounding area of the wound. There is also a warm to touch at the site which is somewhat concerning. Fortunately there is no evidence of systemic infection which is great news. The patient does have a history of diabetes mellitus type 2, Charcot foot which is what led to the wound, and hypertension. She notes that she was in a cast for  some time with Dr. Doran Durand for about 8 weeks. During that time they were utilizing according to the patient silver nitrate along with a foam doughnut and then Coban to secure in place in the cast in place. With that being said I do not have the  actual records to review we are going to try to get a hold of those unfortunately they would not flow over into care everywhere I did look today. She has been seeing Dr. Doran Durand and his physician assistant Larkin Ina up until the end of May and apparently is still seeing them on a regular basis every 2 weeks roughly. She has also tried Iodosorb without effect here. 01/24/2020 upon evaluation today patient actually appears to be doing quite well with regard to her wounds. She has been tolerating the dressing changes without complication. Fortunately there is no signs of active infection spreading which is good news. Her culture did show signs of Staph aureus I did place her on Augmentin due to the erythema surrounding the wound. With that being said the wound does appear to be doing better she has her longer walking cast/boot and I think that is actually good for her for the time being. I am considering reinitiating total contact cast when she gets back from vacation but next week she will actually be out of town at [employer] she knows not to get in the water but she still obviously is planning to enjoy herself she is going to take it easy on her foot however. 02/07/2020 upon evaluation today patient appears to be doing fairly well in regard to her ulcer on her foot. Fortunately there is no signs of severe infection at this time which is great news and overall very pleased in that regard. With that being said I do think that she could still benefit from a total contact cast. Nonetheless she is using her walking boot which at least provide some protection and that it prevents some of the friction occurring when she is ambulating. 02/14/2020 upon evaluation today patient  appears to be doing well with regard to her foot ulcer. This is actually measuring a little bit smaller yet again this week. Overall very pleased with where things stand and I do not see any signs of active infection at this time which is also good news. Since she is measuring better the patient has wanting to somewhat hold off on proceeding with the total contact cast which I think is reasonable at this point. 02/28/2020 on evaluation today patient appears to be doing well in general in regard to her wound although she has a lot of callus buildup as compared to last time I saw her. This is can require sharp debridement today. I do believe she really needs the total contact cast as well which we have discussed previous. Electronic Signature(s) Signed: 02/28/2020 6:24:34 PM By: Worthy Keeler PA-C Entered By: Worthy Keeler on 02/28/2020 18:24:33 -------------------------------------------------------------------------------- Physical Exam Details Patient Name: Date of Service: [name] [name], [name] [name] E. 02/28/2020 3:00 PM Medical Record Number: [id number] Patient Account Number: [id number] Date of Birth/Sex: Treating RN: 01-Mar-1946 (74 y.o. [name] [name] Primary Care Provider: [name] Other Clinician: Referring Provider: Treating Provider/Extender: Landis Martins Weeks in Treatment: 6 Constitutional Well-nourished and well-hydrated in no acute distress. Respiratory normal breathing without difficulty. Psychiatric this patient is able to make decisions and demonstrates good insight into disease process. Alert and Oriented x 3. pleasant and cooperative. Notes Upon inspection patient's wound bed did require sharp debridement to remove callus from the edges of the wound and cleaning of the wound surface as well. Post debridement wound bed appears to be doing much better which is great news. There is no signs of active infection at this time which is also  great news. I did  recommend a total contact cast and we did go ahead and proceed with that during the office visit today. Electronic Signature(s) Signed: 02/28/2020 6:24:58 PM By: Worthy Keeler PA-C Entered By: Worthy Keeler on 02/28/2020 18:24:57 -------------------------------------------------------------------------------- Physician Orders Details Patient Name: Date of Service: [name] [name], [name] [name] E. 02/28/2020 3:00 PM Medical Record Number: [id number] Patient Account Number: [id number] Date of Birth/Sex: Treating RN: 1945-11-21 (74 y.o. [name] [name] Primary Care Provider: [name] Other Clinician: Referring Provider: Treating Provider/Extender: Adele Schilder in Treatment: 6 Verbal / Phone Orders: No Diagnosis Coding ICD-10 Coding Code Description E11.621 Type 2 diabetes mellitus with foot ulcer L97.522 Non-pressure chronic ulcer of other part of left foot with fat layer exposed M14.672 Charcot's joint, left ankle and foot E11.40 Type 2 diabetes mellitus with diabetic neuropathy, unspecified I10 Essential (primary) hypertension Follow-up Appointments ppointment in 1 week. - with Margarita Grizzle Return A ppointment in: - Friday for initial cast change Return A Dressing Change Frequency Wound #1 Left,Medial,Plantar Foot Do not change entire dressing for one week. Wound Cleansing Wound #1 Left,Medial,Plantar Foot May shower with protection. Primary Wound Dressing Wound #1 Left,Medial,Plantar Foot Hydrofera Blue - classic moistened with saline cut to fit inside wound margins Secondary Dressing Wound #1 Left,Medial,Plantar Foot Kerlix/Rolled Gauze Dry Gauze Edema Control Avoid standing for long periods of time Elevate legs to the level of the heart or above for 30 minutes daily and/or when sitting, a frequency of: Off-Loading Total Contact Cast to Left Lower Extremity Electronic Signature(s) Signed: 02/28/2020 6:54:26 PM By: Worthy Keeler PA-C Signed: 02/29/2020 4:56:58  PM By: [name] RN, BSN Entered By: [name] on 02/28/2020 16:37:57 -------------------------------------------------------------------------------- Problem List Details Patient Name: Date of Service: [name] [name] E. 02/28/2020 3:00 PM Medical Record Number: [id number] Patient Account Number: [id number] Date of Birth/Sex: Treating RN: 1946-02-25 (74 y.o. [name] [name] Primary Care Provider: [name] Other Clinician: Referring Provider: Treating Provider/Extender: Adele Schilder in Treatment: 6 Active Problems ICD-10 Encounter Code Description Active Date MDM Diagnosis E11.621 Type 2 diabetes mellitus with foot ulcer 01/17/2020 No Yes L97.522 Non-pressure chronic ulcer of other part of left foot with fat layer exposed 01/17/2020 No Yes M14.672 Charcot's joint, left ankle and foot 01/17/2020 No Yes E11.40 Type 2 diabetes mellitus with diabetic neuropathy, unspecified 01/17/2020 No Yes I10 Essential (primary) hypertension 01/17/2020 No Yes Inactive Problems Resolved Problems Electronic Signature(s) Signed: 02/28/2020 3:17:09 PM By: Worthy Keeler PA-C Entered By: Worthy Keeler on 02/28/2020 15:17:08 -------------------------------------------------------------------------------- Progress Note Details Patient Name: Date of Service: [name] [name], [name] [name] E. 02/28/2020 3:00 PM Medical Record Number: [id number] Patient Account Number: [id number] Date of Birth/Sex: Treating RN: February 20, 1946 (74 y.o. [name] [name] Primary Care Provider: [name] Other Clinician: Referring Provider: Treating Provider/Extender: Adele Schilder in Treatment: 6 Subjective Chief Complaint Information obtained from Patient Left foot ulcer History of Present Illness (HPI) 01/17/2020 upon evaluation today patient presents for initial evaluation here in our clinic concerning issues she has been having with a left medial/plantar foot ulcer. This is  actually been an issue for her since October 2020. She has been seeing Dr. Doran Durand for quite some time during that course. Fortunately there is no signs of active infection at this time. Or least no mention of this to have seen in general. With that being said unfortunately I do see some signs of erythema noted today that does have me  concerned about the possibility of infection at this point in the surrounding area of the wound. There is also a warm to touch at the site which is somewhat concerning. Fortunately there is no evidence of systemic infection which is great news. The patient does have a history of diabetes mellitus type 2, Charcot foot which is what led to the wound, and hypertension. She notes that she was in a cast for some time with Dr. Doran Durand for about 8 weeks. During that time they were utilizing according to the patient silver nitrate along with a foam doughnut and then Coban to secure in place in the cast in place. With that being said I do not have the actual records to review we are going to try to get a hold of those unfortunately they would not flow over into care everywhere I did look today. She has been seeing Dr. Doran Durand and his physician assistant Larkin Ina up until the end of May and apparently is still seeing them on a regular basis every 2 weeks roughly. She has also tried Iodosorb without effect here. 01/24/2020 upon evaluation today patient actually appears to be doing quite well with regard to her wounds. She has been tolerating the dressing changes without complication. Fortunately there is no signs of active infection spreading which is good news. Her culture did show signs of Staph aureus I did place her on Augmentin due to the erythema surrounding the wound. With that being said the wound does appear to be doing better she has her longer walking cast/boot and I think that is actually good for her for the time being. I am considering reinitiating total contact cast when she  gets back from vacation but next week she will actually be out of town at [employer] she knows not to get in the water but she still obviously is planning to enjoy herself she is going to take it easy on her foot however. 02/07/2020 upon evaluation today patient appears to be doing fairly well in regard to her ulcer on her foot. Fortunately there is no signs of severe infection at this time which is great news and overall very pleased in that regard. With that being said I do think that she could still benefit from a total contact cast. Nonetheless she is using her walking boot which at least provide some protection and that it prevents some of the friction occurring when she is ambulating. 02/14/2020 upon evaluation today patient appears to be doing well with regard to her foot ulcer. This is actually measuring a little bit smaller yet again this week. Overall very pleased with where things stand and I do not see any signs of active infection at this time which is also good news. Since she is measuring better the patient has wanting to somewhat hold off on proceeding with the total contact cast which I think is reasonable at this point. 02/28/2020 on evaluation today patient appears to be doing well in general in regard to her wound although she has a lot of callus buildup as compared to last time I saw her. This is can require sharp debridement today. I do believe she really needs the total contact cast as well which we have discussed previous. Objective Constitutional Well-nourished and well-hydrated in no acute distress. Vitals Time Taken: 3:41 PM, Height: 66 in, Weight: 245 lbs, BMI: 39.5, Temperature: 98.6 F, Pulse: 78 bpm, Respiratory Rate: 19 breaths/min, Blood Pressure: 134/81 mmHg. Respiratory normal breathing without difficulty. Psychiatric this patient is  able to make decisions and demonstrates good insight into disease process. Alert and Oriented x 3. pleasant and cooperative. General  Notes: Upon inspection patient's wound bed did require sharp debridement to remove callus from the edges of the wound and cleaning of the wound surface as well. Post debridement wound bed appears to be doing much better which is great news. There is no signs of active infection at this time which is also great news. I did recommend a total contact cast and we did go ahead and proceed with that during the office visit today. Integumentary (Hair, Skin) Wound #1 status is Open. Original cause of wound was Gradually Appeared. The wound is located on the [place]. The wound measures 1.1cm length x 1.1cm width x 0.3cm depth; 0.95cm^2 area and 0.285cm^3 volume. There is Fat Layer (Subcutaneous Tissue) Exposed exposed. There is no tunneling or undermining noted. There is a medium amount of serosanguineous drainage noted. The wound margin is thickened. There is large (67-100%) pink granulation within the wound bed. There is a small (1-33%) amount of necrotic tissue within the wound bed including Adherent Slough. Assessment Active Problems ICD-10 Type 2 diabetes mellitus with foot ulcer Non-pressure chronic ulcer of other part of left foot with fat layer exposed Charcot's joint, left ankle and foot Type 2 diabetes mellitus with diabetic neuropathy, unspecified Essential (primary) hypertension Procedures Wound #1 Pre-procedure diagnosis of Wound #1 is a Diabetic Wound/Ulcer of the Lower Extremity located on the Left,Medial,Plantar Foot .Severity of Tissue Pre Debridement is: Fat layer exposed. There was a Excisional Skin/Subcutaneous Tissue Debridement with a total area of 4 sq cm performed by Worthy Keeler, PA. With the following instrument(s): Curette to remove Viable and Non-Viable tissue/material. Material removed includes Callus, Subcutaneous Tissue, Slough, and Skin: Epidermis after achieving pain control using Lidocaine 4% Topical Solution. No specimens were taken. A time out was  conducted at 16:30, prior to the start of the procedure. A Minimum amount of bleeding was controlled with Pressure. The procedure was tolerated well with a pain level of 0 throughout and a pain level of 0 following the procedure. Post Debridement Measurements: 1.1cm length x 1.1cm width x 0.3cm depth; 0.285cm^3 volume. Character of Wound/Ulcer Post Debridement is improved. Severity of Tissue Post Debridement is: Fat layer exposed. Post procedure Diagnosis Wound #1: Same as Pre-Procedure Pre-procedure diagnosis of Wound #1 is a Diabetic Wound/Ulcer of the Lower Extremity located on the Left,Medial,Plantar Foot . There was a T Contact otal Cast Procedure by Worthy Keeler, PA. Post procedure Diagnosis Wound #1: Same as Pre-Procedure Plan Follow-up Appointments: Return Appointment in 1 week. - with Margarita Grizzle Return Appointment in: - Friday for initial cast change Dressing Change Frequency: Wound #1 Left,Medial,Plantar Foot: Do not change entire dressing for one week. Wound Cleansing: Wound #1 Left,Medial,Plantar Foot: May shower with protection. Primary Wound Dressing: Wound #1 Left,Medial,Plantar Foot: Hydrofera Blue - classic moistened with saline cut to fit inside wound margins Secondary Dressing: Wound #1 Left,Medial,Plantar Foot: Kerlix/Rolled Gauze Dry Gauze Edema Control: Avoid standing for long periods of time Elevate legs to the level of the heart or above for 30 minutes daily and/or when sitting, a frequency of: Off-Loading: T Contact Cast to Left Lower Extremity otal 1. I did apply the total contact cast to the patient's wound after cleaning this off and applying the dressings for today. She tolerated the application without complication. 2. I am also can recommend at this point that the patient avoid getting this wet we discussed that  today as well. 3. We will bring her back on Friday in order to change out the cast in order to make sure nothing is rubbing or causing any  problems and if everything is doing well reapply at that point. We will see patient back for reevaluation in 1 week here in the clinic. If anything worsens or changes patient will contact our office for additional recommendations. Electronic Signature(s) Signed: 02/28/2020 6:25:55 PM By: Worthy Keeler PA-C Entered By: Worthy Keeler on 02/28/2020 18:25:54 -------------------------------------------------------------------------------- Total Contact Cast Details Patient Name: Date of Service: [name] [name], [name] [name] E. 02/28/2020 3:00 PM Medical Record Number: [id number] Patient Account Number: [id number] Date of Birth/Sex: Treating RN: [birth year] (74 y.o. [name] [name] Primary Care Provider: [name] Other Clinician: Referring Provider: Treating Provider/Extender: Adele Schilder in Treatment: 6 T Contact Cast Applied for Wound Assessment: otal Wound #1 Left,Medial,Plantar Foot Performed By: Physician Worthy Keeler, PA Post Procedure Diagnosis Same as Pre-procedure Electronic Signature(s) Signed: 02/28/2020 6:54:26 PM By: Worthy Keeler PA-C Signed: 02/29/2020 4:56:58 PM By: [name] RN, BSN Entered By: [name] on 02/28/2020 16:38:27 -------------------------------------------------------------------------------- SuperBill Details Patient Name: Date of Service: [name] [name] E. 02/28/2020 Medical Record Number: [id number] Patient Account Number: [id number] Date of Birth/Sex: Treating RN: August 27, 1945 (75 y.o. [name] [name] Primary Care Provider: [name] Other Clinician: Referring Provider: Treating Provider/Extender: Landis Martins Weeks in Treatment: 6 Diagnosis Coding ICD-10 Codes Code Description E11.621 Type 2 diabetes mellitus with foot ulcer L97.522 Non-pressure chronic ulcer of other part of left foot with fat layer exposed M14.672 Charcot's joint, left ankle and foot E11.40 Type 2 diabetes mellitus with  diabetic neuropathy, unspecified I10 Essential (primary) hypertension Facility Procedures CPT4 Code: [id number] Description: 11021 - DEB SUBQ TISSUE 20 SQ CM/< ICD-10 Diagnosis Description L97.522 Non-pressure chronic ulcer of other part of left foot with fat layer exposed Modifier: Quantity: 1 Physician Procedures : CPT4 Code Description Modifier [id number] 11042 - WC PHYS SUBQ TISS 20 SQ CM ICD-10 Diagnosis Description L97.522 Non-pressure chronic ulcer of other part of left foot with fat layer exposed Quantity: 1 Electronic Signature(s) Signed: 02/28/2020 6:26:06 PM By: Worthy Keeler PA-C Entered By: Worthy Keeler on 02/28/2020 18:26:05

## 2020-03-01 ENCOUNTER — Encounter (HOSPITAL_BASED_OUTPATIENT_CLINIC_OR_DEPARTMENT_OTHER): Payer: Medicare Other | Admitting: Internal Medicine

## 2020-03-01 DIAGNOSIS — E1140 Type 2 diabetes mellitus with diabetic neuropathy, unspecified: Secondary | ICD-10-CM | POA: Diagnosis not present

## 2020-03-01 DIAGNOSIS — E11621 Type 2 diabetes mellitus with foot ulcer: Secondary | ICD-10-CM | POA: Diagnosis not present

## 2020-03-01 DIAGNOSIS — I10 Essential (primary) hypertension: Secondary | ICD-10-CM | POA: Diagnosis not present

## 2020-03-01 DIAGNOSIS — L97522 Non-pressure chronic ulcer of other part of left foot with fat layer exposed: Secondary | ICD-10-CM | POA: Diagnosis not present

## 2020-03-01 DIAGNOSIS — M14672 Charcot's joint, left ankle and foot: Secondary | ICD-10-CM | POA: Diagnosis not present

## 2020-03-01 NOTE — Progress Notes (Signed)
[name] [name] ([id number]) Visit Report for 03/01/2020 Arrival Information Details Patient Name: Date of Service: [name] [name], [name] E. 03/01/2020 3:30 PM Medical Record Number: [id number] Patient Account Number: [id number] Date of Birth/Sex: Treating RN: 1945-09-18 (74 y.o. [name] [name] [name] [name]: [name] [name] Other Clinician: Referring [name] [name]: Treating [name] [name]/Extender: [name] [name] in Treatment: 6 Visit Information History Since Last Visit Added or deleted any medications: No Patient Arrived: Wheel Chair Any new allergies or adverse reactions: No Arrival Time: 16:59 Had a fall or experienced change in No Accompanied By: self activities of daily living that may affect Transfer Assistance: None risk of falls: Patient Identification Verified: Yes Signs or symptoms of abuse/neglect since last visito No Secondary Verification Process Completed: Yes Hospitalized since last visit: No Patient Requires Transmission-Based Precautions: No Implantable device outside of the clinic excluding No Patient Has Alerts: No cellular tissue based products placed in the center since last visit: Has Dressing in Place as Prescribed: Yes Has Footwear/Offloading in Place as Prescribed: Yes Left: T Contact Cast otal Pain Present Now: No Electronic Signature(s) Signed: 03/01/2020 5:51:32 PM By: [name] [name] Entered By: [name] [name] on 03/01/2020 17:00:53 -------------------------------------------------------------------------------- Encounter Discharge Information Details Patient Name: Date of Service: [name] Amato RA E. 03/01/2020 3:30 PM Medical Record Number: [id number] Patient Account Number: [id number] Date of Birth/Sex: Treating RN: 1946-02-05 (74 y.o. [name] [name] Angele Wiemann: [name] [name] Other Clinician: Referring [name] [name]: Treating [name] [name]/Extender: [name] [name] in Treatment: 6 Encounter  Discharge Information Items Discharge Condition: Stable Ambulatory Status: Wheelchair Discharge Destination: Home Transportation: Private Auto Accompanied By: self Schedule Follow-up Appointment: Yes Clinical Summary of [name]: Patient Declined Electronic Signature(s) Signed: 03/01/2020 5:51:32 PM By: [name] [name] Entered By: [name] [name] on 03/01/2020 17:05:46 -------------------------------------------------------------------------------- Lower Extremity Assessment Details Patient Name: Date of Service: [name] Amato RA E. 03/01/2020 3:30 PM Medical Record Number: [id number] Patient Account Number: [id number] Date of Birth/Sex: Treating RN: 17-Apr-1946 (74 y.o. [name] [name] Dashan Chizmar: [name] [name] Other Clinician: Referring [name] [name]: Treating [name] [name]/Extender: [name] [name] in Treatment: 6 Edema Assessment Assessed: [Left: No] [Right: No] Edema: [Left: Ye] [Right: s] Calf Left: Right: Point of Measurement: 35 cm From Medial Instep 41 cm cm Ankle Left: Right: Point of Measurement: 9 cm From Medial Instep 24.5 cm cm Vascular Assessment Pulses: Dorsalis Pedis Palpable: [Left:Yes] Electronic Signature(s) Signed: 03/01/2020 5:51:32 PM By: [name] [name] Entered By: [name] [name] on 03/01/2020 17:01:29 -------------------------------------------------------------------------------- Multi Wound Chart Details Patient Name: Date of Service: [name] Amato RA E. 03/01/2020 3:30 PM Medical Record Number: [id number] Patient Account Number: [id number] Date of Birth/Sex: Treating RN: 06-13-1946 (74 y.o. [name] [name] Ayla Dunigan: [name] [name] Other Clinician: Referring Alletta Mattos: Treating [name] [name]/Extender: [name] [name] in Treatment: 6 Vital Signs Height(in): 66 Pulse(bpm): 94 Weight(lbs): 245 Blood Pressure(mmHg): 151/24 Body Mass Index(BMI): 40 Temperature(F): 98.2 Respiratory  Rate(breaths/min): 18 Photos: [1:No Photos Left, Medial, Plantar Foot] [N/A:N/A N/A] Wound Location: [1:Gradually Appeared] [N/A:N/A] Wounding Event: [1:Diabetic Wound/Ulcer of the Lower] [N/A:N/A] Primary Etiology: [1:Extremity Sleep Apnea, Deep Vein Thrombosis,] [N/A:N/A] Comorbid History: [1:Hypertension, Colitis, Type II Diabetes, Gout, Osteoarthritis, Neuropathy 05/11/2019] [N/A:N/A] Date Acquired: [1:6] [N/A:N/A] Weeks of Treatment: [1:Open] [N/A:N/A] Wound Status: [1:1.1x1.1x0.3] [N/A:N/A] Measurements L x W x D (cm) [1:0.95] [N/A:N/A] A (cm) : rea [1:0.285] [N/A:N/A] Volume (cm) : [1:7.00%] [N/A:N/A] % Reduction in A rea: [1:30.10%] [N/A:N/A] % Reduction in Volume: [1:Grade 2] [N/A:N/A] Classification: [1:Medium] [N/A:N/A] Exudate A mount: [1:Serosanguineous] [N/A:N/A] Exudate Type: [  1:red, brown] [N/A:N/A] Exudate Color: [1:Thickened] [N/A:N/A] Wound Margin: [1:Large (67-100%)] [N/A:N/A] Granulation A mount: [1:Pink] [N/A:N/A] Granulation Quality: [1:Small (1-33%)] [N/A:N/A] Necrotic A mount: [1:Fat Layer (Subcutaneous Tissue)] [N/A:N/A] Exposed Structures: [1:Exposed: Yes Fascia: No Tendon: No Muscle: No Joint: No Bone: No None] [N/A:N/A] Epithelialization: [1:T Contact Cast otal] [N/A:N/A] Treatment Notes Wound #1 (Left, Medial, Plantar Foot) 1. Cleanse With Wound Cleanser Soap and water 3. Primary Dressing Applied Hydrofera Blue 4. Secondary Dressing Dry Gauze Roll Gauze 7. Footwear/Offloading device applied T Contact Cast otal Notes normal saline to moisten hydroferablue classic, total contact cast applied by MD Electronic Signature(s) Signed: 03/01/2020 5:20:32 PM By: [name] Ham MD Signed: 03/01/2020 5:51:32 PM By: [name] [name] Entered By: [name] [name] on 03/01/2020 17:12:10 -------------------------------------------------------------------------------- Pain Assessment Details Patient Name: Date of Service: [name] Amato RA E. 03/01/2020 3:30  PM Medical Record Number: [id number] Patient Account Number: [id number] Date of Birth/Sex: Treating RN: 1945-09-23 (74 y.o. [name] [name] [name] [name]: [name] [name] Other Clinician: Referring [name] [name]: Treating [name] [name]/Extender: [name] [name] in Treatment: 6 Active Problems Location of Pain Severity and Description of Pain Patient Has Paino No Site Locations Pain Management and Medication Current Pain Management: Electronic Signature(s) Signed: 03/01/2020 5:51:32 PM By: [name] [name] Entered By: [name] [name] on 03/01/2020 17:01:22 -------------------------------------------------------------------------------- Wound Assessment Details Patient Name: Date of Service: [name] Amato RA E. 03/01/2020 3:30 PM Medical Record Number: [id number] Patient Account Number: [id number] Date of Birth/Sex: Treating RN: 11-23-1945 (74 y.o. [name] [name] Arash Karstens: [name] [name] Other Clinician: Referring Lorely Bubb: Treating Palmira Stickle/Extender: [name] [name] in Treatment: 6 Wound Status Wound Number: 1 Primary Diabetic Wound/Ulcer of the Lower Extremity Etiology: Wound Location: Left, Medial, Plantar Foot Wound Open Wounding Event: Gradually Appeared Status: Date Acquired: 05/11/2019 Comorbid Sleep Apnea, Deep Vein Thrombosis, Hypertension, Colitis, Type Weeks Of Treatment: 6 History: II Diabetes, Gout, Osteoarthritis, Neuropathy Clustered Wound: No Wound Measurements Length: (cm) 1.1 Width: (cm) 1.1 Depth: (cm) 0.3 Area: (cm) 0.95 Volume: (cm) 0.285 % Reduction in Area: 7% % Reduction in Volume: 30.1% Epithelialization: None Tunneling: No Undermining: No Wound Description Classification: Grade 2 Wound Margin: Thickened Exudate Amount: Medium Exudate Type: Serosanguineous Exudate Color: red, brown Foul Odor After Cleansing: No Slough/Fibrino Yes Wound Bed Granulation Amount: Large (67-100%)  Exposed Structure Granulation Quality: Pink Fascia Exposed: No Necrotic Amount: Small (1-33%) Fat Layer (Subcutaneous Tissue) Exposed: Yes Necrotic Quality: Adherent Slough Tendon Exposed: No Muscle Exposed: No Joint Exposed: No Bone Exposed: No Treatment Notes Wound #1 (Left, Medial, Plantar Foot) 1. Cleanse With Wound Cleanser Soap and water 3. Primary Dressing Applied Hydrofera Blue 4. Secondary Dressing Dry Gauze Roll Gauze 7. Footwear/Offloading device applied T Contact Cast otal Notes normal saline to moisten hydroferablue classic, total contact cast applied by MD Electronic Signature(s) Signed: 03/01/2020 5:51:32 PM By: [name] [name] Entered By: [name] [name] on 03/01/2020 17:01:58 -------------------------------------------------------------------------------- Vitals Details Patient Name: Date of Service: [name] Amato RA E. 03/01/2020 3:30 PM Medical Record Number: [id number] Patient Account Number: [id number] Date of Birth/Sex: Treating RN: 03-17-1946 (74 y.o. [name] [name] Geniva Lohnes: [name] [name] Other Clinician: Referring Nyle Limb: Treating Beni Turrell/Extender: [name] [name] in Treatment: 6 Vital Signs Time Taken: 16:30 Temperature (F): 98.2 Height (in): 66 Pulse (bpm): 94 Weight (lbs): 245 Respiratory Rate (breaths/min): 18 Body Mass Index (BMI): 39.5 Blood Pressure (mmHg): 151/24 Reference Range: 80 - 120 mg / dl Electronic Signature(s) Signed: 03/01/2020 5:51:32 PM By: [name] [name] Entered By: [name] [name] on 03/01/2020 17:01:17

## 2020-03-01 NOTE — Progress Notes (Signed)
[name] [name], [name] [name] ([id number]) Visit Report for 03/01/2020 HPI Details Patient Name: Date of Service: [name] [name], [name] [name] 03/01/2020 3:30 PM Medical Record Number: [id number] Patient Account Number: [id number] Date of Birth/Sex: Treating RN: Jul 18, 1946 (74 y.o. [name] [name] Primary Care Provider: Billey [name] Other Clinician: Referring Provider: Treating Provider/Extender: [name] [name] in Treatment: 6 History of Present Illness HPI Description: 01/17/2020 upon evaluation today patient presents for initial evaluation here in our clinic concerning issues she has been having with a left medial/plantar foot ulcer. This is actually been an issue for her since October 2020. She has been seeing Dr. Doran [name] for quite some time during that course. Fortunately there is no signs of active infection at this time. Or least no mention of this to have seen in general. With that being said unfortunately I do see some signs of erythema noted today that does have me concerned about the possibility of infection at this point in the surrounding area of the wound. There is also a warm to touch at the site which is somewhat concerning. Fortunately there is no evidence of systemic infection which is great news. The patient does have a history of diabetes mellitus type 2, Charcot foot which is what led to the wound, and hypertension. She notes that she was in a cast for some time with Dr. Doran [name] for about 8 weeks. During that time they were utilizing according to the patient silver nitrate along with a foam doughnut and then Coban to secure in place in the cast in place. With that being said I do not have the actual records to review we are going to try to get a hold of those unfortunately they would not flow over into care everywhere I did look today. She has been seeing Dr. Doran [name] and his physician assistant [name] [name] up until the end of May and apparently is still seeing them on a regular basis  every 2 weeks roughly. She has also tried Iodosorb without effect here. 01/24/2020 upon evaluation today patient actually appears to be doing quite well with regard to her wounds. She has been tolerating the dressing changes without complication. Fortunately there is no signs of active infection spreading which is good news. Her culture did show signs of Staph aureus I did place her on Augmentin due to the erythema surrounding the wound. With that being said the wound does appear to be doing better she has her longer walking cast/boot and I think that is actually good for her for the time being. I am considering reinitiating total contact cast when she gets back from vacation but next week she will actually be out of town at [employer] she knows not to get in the water but she still obviously is planning to enjoy herself she is going to take it easy on her foot however. 02/07/2020 upon evaluation today patient appears to be doing fairly well in regard to her ulcer on her foot. Fortunately there is no signs of severe infection at this time which is great news and overall very pleased in that regard. With that being said I do think that she could still benefit from a total contact cast. Nonetheless she is using her walking boot which at least provide some protection and that it prevents some of the friction occurring when she is ambulating. 02/14/2020 upon evaluation today patient appears to be doing well with regard to her foot ulcer. This is actually measuring a little bit smaller yet again this  week. Overall very pleased with where things stand and I do not see any signs of active infection at this time which is also good news. Since she is measuring better the patient has wanting to somewhat hold off on proceeding with the total contact cast which I think is reasonable at this point. 02/28/2020 on evaluation today patient appears to be doing well in general in regard to her wound although she has a lot of  callus buildup as compared to last time I saw her. This is can require sharp debridement today. I do believe she really needs the total contact cast as well which we have discussed previous. 7/23; patient comes in for a total contact cast change Electronic Signature(s) Signed: 03/01/2020 5:20:32 PM By: Linton Ham MD Entered By: Linton Ham on 03/01/2020 17:12:46 -------------------------------------------------------------------------------- Physical Exam Details Patient Name: Date of Service: [name] [name] 03/01/2020 3:30 PM Medical Record Number: [id number] Patient Account Number: [id number] Date of Birth/Sex: Treating RN: 02-28-1946 (74 y.o. [name] [name] Primary Care Provider: Billey [name] Other Clinician: Referring Provider: Treating Provider/Extender: [name] [name] in Treatment: 6 Constitutional Patient is hypertensive.. Pulse regular and within target range for patient.Marland Kitchen Respirations regular, non-labored and within target range.. Temperature is normal and within the target range for the patient.Marland Kitchen Appears in no distress. Notes Patient's wound was not examined. The patient tolerated her total contact cast well. This was replaced today in the standard fashion. Electronic Signature(s) Signed: 03/01/2020 5:20:32 PM By: Linton Ham MD Entered By: Linton Ham on 03/01/2020 17:13:21 -------------------------------------------------------------------------------- Physician Orders Details Patient Name: Date of Service: [name] [name] 03/01/2020 3:30 PM Medical Record Number: [id number] Patient Account Number: [id number] Date of Birth/Sex: Treating RN: 1945-10-23 (74 y.o. [name] [name] Primary Care Provider: Billey [name] Other Clinician: Referring Provider: Treating Provider/Extender: [name] [name] in Treatment: 6 Verbal / Phone Orders: No Diagnosis Coding Follow-up Appointments ppointment in 1 week. -  with [name] [name] Return A Dressing Change Frequency Wound #1 Left,Medial,Plantar Foot Do not change entire dressing for one week. Wound Cleansing Wound #1 Left,Medial,Plantar Foot May shower with protection. Primary Wound Dressing Wound #1 Left,Medial,Plantar Foot Hydrofera Blue - classic moistened with saline cut to fit inside wound margins Secondary Dressing Wound #1 Left,Medial,Plantar Foot Kerlix/Rolled Gauze Dry Gauze Edema Control Avoid standing for long periods of time Elevate legs to the level of the heart or above for 30 minutes daily and/or when sitting, a frequency of: Off-Loading Total Contact Cast to Left Lower Extremity Electronic Signature(s) Signed: 03/01/2020 5:20:32 PM By: Linton Ham MD Signed: 03/01/2020 5:51:32 PM By: Kela Millin Entered By: Kela Millin on 03/01/2020 16:59:28 -------------------------------------------------------------------------------- Problem List Details Patient Name: Date of Service: [name] [name] 03/01/2020 3:30 PM Medical Record Number: [id number] Patient Account Number: [id number] Date of Birth/Sex: Treating RN: 1946-06-20 (74 y.o. [name] [name] Primary Care Provider: Billey [name] Other Clinician: Referring Provider: Treating Provider/Extender: [name] [name] in Treatment: 6 Active Problems ICD-10 Encounter Code Description Active Date MDM Diagnosis E11.621 Type 2 diabetes mellitus with foot ulcer 01/17/2020 No Yes L97.522 Non-pressure chronic ulcer of other part of left foot with fat layer exposed 01/17/2020 No Yes M14.672 Charcot's joint, left ankle and foot 01/17/2020 No Yes E11.40 Type 2 diabetes mellitus with diabetic neuropathy, unspecified 01/17/2020 No Yes I10 Essential (primary) hypertension 01/17/2020 No Yes Inactive Problems Resolved Problems Electronic Signature(s) Signed: 03/01/2020 5:20:32 PM By: Linton Ham MD Entered By: Linton Ham on 03/01/2020  17:12:03 -------------------------------------------------------------------------------- Progress Note Details Patient Name: Date of Service: [name] [name], [name] E. 03/01/2020 3:30 PM Medical Record Number: [id number] Patient Account Number: [id number] Date of Birth/Sex: Treating RN: [birth year] (75 y.o. [name] [name] Primary Care Provider: Billey [name] Other Clinician: Referring Provider: Treating Provider/Extender: [name] [name] in Treatment: 6 Subjective History of Present Illness (HPI) 01/17/2020 upon evaluation today patient presents for initial evaluation here in our clinic concerning issues she has been having with a left medial/plantar foot ulcer. This is actually been an issue for her since October 2020. She has been seeing Dr. Doran [name] for quite some time during that course. Fortunately there is no signs of active infection at this time. Or least no mention of this to have seen in general. With that being said unfortunately I do see some signs of erythema noted today that does have me concerned about the possibility of infection at this point in the surrounding area of the wound. There is also a warm to touch at the site which is somewhat concerning. Fortunately there is no evidence of systemic infection which is great news. The patient does have a history of diabetes mellitus type 2, Charcot foot which is what led to the wound, and hypertension. She notes that she was in a cast for some time with Dr. Doran [name] for about 8 weeks. During that time they were utilizing according to the patient silver nitrate along with a foam doughnut and then Coban to secure in place in the cast in place. With that being said I do not have the actual records to review we are going to try to get a hold of those unfortunately they would not flow over into care everywhere I did look today. She has been seeing Dr. Doran [name] and his physician assistant [name] [name] up until the end of May and  apparently is still seeing them on a regular basis every 2 weeks roughly. She has also tried Iodosorb without effect here. 01/24/2020 upon evaluation today patient actually appears to be doing quite well with regard to her wounds. She has been tolerating the dressing changes without complication. Fortunately there is no signs of active infection spreading which is good news. Her culture did show signs of Staph aureus I did place her on Augmentin due to the erythema surrounding the wound. With that being said the wound does appear to be doing better she has her longer walking cast/boot and I think that is actually good for her for the time being. I am considering reinitiating total contact cast when she gets back from vacation but next week she will actually be out of town at [employer] she knows not to get in the water but she still obviously is planning to enjoy herself she is going to take it easy on her foot however. 02/07/2020 upon evaluation today patient appears to be doing fairly well in regard to her ulcer on her foot. Fortunately there is no signs of severe infection at this time which is great news and overall very pleased in that regard. With that being said I do think that she could still benefit from a total contact cast. Nonetheless she is using her walking boot which at least provide some protection and that it prevents some of the friction occurring when she is ambulating. 02/14/2020 upon evaluation today patient appears to be doing well with regard to her foot ulcer. This is actually measuring a little bit smaller yet again this week. Overall very pleased with  where things stand and I do not see any signs of active infection at this time which is also good news. Since she is measuring better the patient has wanting to somewhat hold off on proceeding with the total contact cast which I think is reasonable at this point. 02/28/2020 on evaluation today patient appears to be doing well in  general in regard to her wound although she has a lot of callus buildup as compared to last time I saw her. This is can require sharp debridement today. I do believe she really needs the total contact cast as well which we have discussed previous. 7/23; patient comes in for a total contact cast change Objective Constitutional Patient is hypertensive.. Pulse regular and within target range for patient.Marland Kitchen Respirations regular, non-labored and within target range.. Temperature is normal and within the target range for the patient.Marland Kitchen Appears in no distress. Vitals Time Taken: 4:30 PM, Height: 66 in, Weight: 245 lbs, BMI: 39.5, Temperature: 98.2 F, Pulse: 94 bpm, Respiratory Rate: 18 breaths/min, Blood Pressure: 151/24 mmHg. General Notes: Patient's wound was not examined. The patient tolerated her total contact cast well. This was replaced today in the standard fashion. Integumentary (Hair, Skin) Wound #1 status is Open. Original cause of wound was Gradually Appeared. The wound is located on the [place]. The wound measures 1.1cm length x 1.1cm width x 0.3cm depth; 0.95cm^2 area and 0.285cm^3 volume. There is Fat Layer (Subcutaneous Tissue) Exposed exposed. There is no tunneling or undermining noted. There is a medium amount of serosanguineous drainage noted. The wound margin is thickened. There is large (67-100%) pink granulation within the wound bed. There is a small (1-33%) amount of necrotic tissue within the wound bed including Adherent Slough. Assessment Active Problems ICD-10 Type 2 diabetes mellitus with foot ulcer Non-pressure chronic ulcer of other part of left foot with fat layer exposed Charcot's joint, left ankle and foot Type 2 diabetes mellitus with diabetic neuropathy, unspecified Essential (primary) hypertension Procedures Wound #1 Pre-procedure diagnosis of Wound #1 is a Diabetic Wound/Ulcer of the Lower Extremity located on the Left,Medial,Plantar Foot . There  was a T Contact otal Cast Procedure by Ricard Dillon., MD. Post procedure Diagnosis Wound #1: Same as Pre-Procedure Plan Follow-up Appointments: Return Appointment in 1 week. - with [name] [name] Dressing Change Frequency: Wound #1 Left,Medial,Plantar Foot: Do not change entire dressing for one week. Wound Cleansing: Wound #1 Left,Medial,Plantar Foot: May shower with protection. Primary Wound Dressing: Wound #1 Left,Medial,Plantar Foot: Hydrofera Blue - classic moistened with saline cut to fit inside wound margins Secondary Dressing: Wound #1 Left,Medial,Plantar Foot: Kerlix/Rolled Gauze Dry Gauze Edema Control: Avoid standing for long periods of time Elevate legs to the level of the heart or above for 30 minutes daily and/or when sitting, a frequency of: Off-Loading: T Contact Cast to Left Lower Extremity otal 1. T contact cast replacement. The patient will be seen next week otal Electronic Signature(s) Signed: 03/01/2020 5:20:32 PM By: Linton Ham MD Entered By: Linton Ham on 03/01/2020 17:13:58 -------------------------------------------------------------------------------- Total Contact Cast Details Patient Name: Date of Service: [name] [name] 03/01/2020 3:30 PM Medical Record Number: [id number] Patient Account Number: [id number] Date of Birth/Sex: Treating RN: [birth year] (74 y.o. [name] [name] Primary Care Provider: Billey [name] Other Clinician: Referring Provider: Treating Provider/Extender: [name] [name] in Treatment: 6 T Contact Cast Applied for Wound Assessment: otal Wound #1 Left,Medial,Plantar Foot Performed By: Physician Ricard Dillon., MD Post Procedure Diagnosis Same as Pre-procedure Electronic Signature(s) Signed: 03/01/2020  5:20:32 PM By: Linton Ham MD Entered By: Linton Ham on 03/01/2020 17:12:19 -------------------------------------------------------------------------------- SuperBill  Details Patient Name: Date of Service: [name] Guarneri RBA RA E. 03/01/2020 Medical Record Number: [id number] Patient Account Number: [id number] Date of Birth/Sex: Treating RN: Sep 14, 1945 (74 y.o. Hollie Salk, Larene [place] Primary Care Provider: Billey [name] Other Clinician: Referring Provider: Treating Provider/Extender: [name] [name] in Treatment: 6 Diagnosis Coding ICD-10 Codes Code Description E11.621 Type 2 diabetes mellitus with foot ulcer L97.522 Non-pressure chronic ulcer of other part of left foot with fat layer exposed M14.672 Charcot's joint, left ankle and foot E11.40 Type 2 diabetes mellitus with diabetic neuropathy, unspecified I10 Essential (primary) hypertension Facility Procedures Physician Procedures : CPT4 Code Description Modifier [id number] - WC PHYS APPLY TOTAL CONTACT CAST ICD-10 Diagnosis Description L97.522 Non-pressure chronic ulcer of other part of left foot with fat layer exposed Quantity: 1 Electronic Signature(s) Signed: 03/01/2020 5:20:32 PM By: Linton Ham MD Entered By: Linton Ham on 03/01/2020 17:14:07

## 2020-03-06 ENCOUNTER — Other Ambulatory Visit: Payer: Self-pay

## 2020-03-06 ENCOUNTER — Encounter (HOSPITAL_BASED_OUTPATIENT_CLINIC_OR_DEPARTMENT_OTHER): Payer: Medicare Other | Admitting: Physician Assistant

## 2020-03-06 ENCOUNTER — Other Ambulatory Visit: Payer: Self-pay | Admitting: Internal Medicine

## 2020-03-06 DIAGNOSIS — E1140 Type 2 diabetes mellitus with diabetic neuropathy, unspecified: Secondary | ICD-10-CM | POA: Diagnosis not present

## 2020-03-06 DIAGNOSIS — E11621 Type 2 diabetes mellitus with foot ulcer: Secondary | ICD-10-CM | POA: Diagnosis not present

## 2020-03-06 DIAGNOSIS — M14672 Charcot's joint, left ankle and foot: Secondary | ICD-10-CM | POA: Diagnosis not present

## 2020-03-06 DIAGNOSIS — I10 Essential (primary) hypertension: Secondary | ICD-10-CM | POA: Diagnosis not present

## 2020-03-06 DIAGNOSIS — L97522 Non-pressure chronic ulcer of other part of left foot with fat layer exposed: Secondary | ICD-10-CM | POA: Diagnosis not present

## 2020-03-06 NOTE — Progress Notes (Signed)
KATIANA, RULAND ([id number]) Visit Report for 03/06/2020 Arrival Information Details Patient Name: Date of Service: RAKIYA, KRAWCZYK RA E. 03/06/2020 3:15 PM Medical Record Number: [id number] Patient Account Number: [id number] Date of Birth/Sex: Treating RN: 09/07/1945 (74 y.o. Freddy Finner Primary Care Sagal Gayton: Cheryll Cockayne Other Clinician: Referring Velta Rockholt: Treating Fynley Chrystal/Extender: Rickard Patience in Treatment: 7 Visit Information History Since Last Visit All ordered tests and consults were completed: No Patient Arrived: Gilmer Mor Added or deleted any medications: No Arrival Time: 16:31 Any new allergies or adverse reactions: No Accompanied By: self Had a fall or experienced change in No Transfer Assistance: None activities of daily living that may affect Patient Identification Verified: Yes risk of falls: Secondary Verification Process Completed: Yes Signs or symptoms of abuse/neglect since last visito No Patient Requires Transmission-Based Precautions: No Hospitalized since last visit: No Patient Has Alerts: No Implantable device outside of the clinic excluding No cellular tissue based products placed in the center since last visit: Has Dressing in Place as Prescribed: Yes Has Footwear/Offloading in Place as Prescribed: Yes Left: T Contact Cast otal Pain Present Now: No Electronic Signature(s) Signed: 03/06/2020 6:04:59 PM By: Yevonne Pax RN Entered By: Yevonne Pax on 03/06/2020 16:32:01 -------------------------------------------------------------------------------- Encounter Discharge Information Details Patient Name: Date of Service: Delton Prairie RA E. 03/06/2020 3:15 PM Medical Record Number: [id number] Patient Account Number: [id number] Date of Birth/Sex: Treating RN: May 27, 1946 (74 y.o. [name] Primary Care Lenah Messenger: Cheryll Cockayne Other Clinician: Referring Roquel Burgin: Treating Daissy Yerian/Extender: Rickard Patience in  Treatment: 7 Encounter Discharge Information Items Post Procedure Vitals Discharge Condition: Stable Temperature (F): 98.3 Ambulatory Status: Cane Pulse (bpm): 102 Discharge Destination: Home Respiratory Rate (breaths/min): 18 Transportation: Private Auto Blood Pressure (mmHg): 157/74 Accompanied By: alone Schedule Follow-up Appointment: Yes Clinical Summary of Care: Patient Declined Electronic Signature(s) Signed: 03/06/2020 6:13:34 PM By: Zandra Abts RN, BSN Entered By: Zandra Abts on 03/06/2020 17:54:32 -------------------------------------------------------------------------------- Lower Extremity Assessment Details Patient Name: Date of Service: Delton Prairie RA E. 03/06/2020 3:15 PM Medical Record Number: [id number] Patient Account Number: [id number] Date of Birth/Sex: Treating RN: 11-16-1945 (74 y.o. Freddy Finner Primary Care Traniyah Hallett: Cheryll Cockayne Other Clinician: Referring Iyannah Blake: Treating Latorya Bautch/Extender: Atlee Abide Weeks in Treatment: 7 Edema Assessment Assessed: [Left: No] [Right: No] Edema: [Left: Ye] [Right: s] Calf Left: Right: Point of Measurement: 35 cm From Medial Instep 41 cm cm Ankle Left: Right: Point of Measurement: 9 cm From Medial Instep 24.5 cm cm Electronic Signature(s) Signed: 03/06/2020 6:04:59 PM By: Yevonne Pax RN Entered By: Yevonne Pax on 03/06/2020 16:32:40 -------------------------------------------------------------------------------- Multi-Disciplinary Care Plan Details Patient Name: Date of Service: Delton Prairie RA E. 03/06/2020 3:15 PM Medical Record Number: [id number] Patient Account Number: [id number] Date of Birth/Sex: Treating RN: [birth year] (74 y.o. [name] Primary Care Jayden Kratochvil: Cheryll Cockayne Other Clinician: Referring Demonie Kassa: Treating Raghav Verrilli/Extender: Rickard Patience in Treatment: 7 Active Inactive Nutrition Nursing Diagnoses: Impaired glucose control:  actual or potential Potential for alteratiion in Nutrition/Potential for imbalanced nutrition Goals: Patient/caregiver verbalizes understanding of need to maintain therapeutic glucose control per primary care physician Date Initiated: 01/17/2020 Date Inactivated: 02/14/2020 Target Resolution Date: 02/14/2020 Goal Status: Met Patient/caregiver will maintain therapeutic glucose control Date Initiated: 01/17/2020 Target Resolution Date: 03/13/2020 Goal Status: Active Interventions: Assess HgA1c results as ordered upon admission and as needed Assess patient nutrition upon admission and as needed per policy Provide education on elevated blood sugars and impact on wound healing  Treatment Activities: Patient referred to Primary Care Physician for further nutritional evaluation : 01/17/2020 Notes: Wound/Skin Impairment Nursing Diagnoses: Impaired tissue integrity Knowledge deficit related to ulceration/compromised skin integrity Goals: Patient/caregiver will verbalize understanding of skin care regimen Date Initiated: 01/17/2020 Target Resolution Date: 03/13/2020 Goal Status: Active Ulcer/skin breakdown will have a volume reduction of 30% by week 4 Date Initiated: 01/17/2020 Date Inactivated: 02/14/2020 Target Resolution Date: 02/14/2020 Goal Status: Met Ulcer/skin breakdown will have a volume reduction of 50% by week 8 Date Initiated: 02/14/2020 Target Resolution Date: 03/13/2020 Goal Status: Active Interventions: Assess patient/caregiver ability to obtain necessary supplies Assess patient/caregiver ability to perform ulcer/skin care regimen upon admission and as needed Assess ulceration(s) every visit Provide education on ulcer and skin care Treatment Activities: Skin care regimen initiated : 01/17/2020 Topical wound management initiated : 01/17/2020 Notes: Electronic Signature(s) Signed: 03/06/2020 6:38:59 PM By: [name] RN, BSN Entered By: [name] on 03/06/2020  16:58:33 -------------------------------------------------------------------------------- Pain Assessment Details Patient Name: Date of Service: [name] 03/06/2020 3:15 PM Medical Record Number: [id number] Patient Account Number: [id number] Date of Birth/Sex: Treating RN: 06/07/1946 (74 y.o. [name] Primary Care Cordella Nyquist: [name] Other Clinician: Referring Marquan Vokes: Treating Callie Facey/Extender: Landis Martins Weeks in Treatment: 7 Active Problems Location of Pain Severity and Description of Pain Patient Has Paino No Site Locations Pain Management and Medication Current Pain Management: Electronic Signature(s) Signed: 03/06/2020 6:04:59 PM By: Carlene Coria RN Entered By: Carlene Coria on 03/06/2020 16:32:35 -------------------------------------------------------------------------------- Patient/Caregiver Education Details Patient Name: Date of Service: [name] 7/28/2021andnbsp3:15 PM Medical Record Number: [id number] Patient Account Number: [id number] Date of Birth/Gender: Treating RN: 03/04/1946 (74 y.o. [name] Primary Care Physician: [name] Other Clinician: Referring Physician: Treating Physician/Extender: Adele Schilder in Treatment: 7 Education Assessment Education Provided To: Patient Education Topics Provided Elevated Blood Sugar/ Impact on Healing: Methods: Explain/Verbal Responses: Reinforcements needed, State content correctly Offloading: Methods: Explain/Verbal Responses: Reinforcements needed, State content correctly Wound/Skin Impairment: Methods: Explain/Verbal Responses: Reinforcements needed, State content correctly Electronic Signature(s) Signed: 03/06/2020 6:38:59 PM By: [name] RN, BSN Entered By: [name] on 03/06/2020 16:58:59 -------------------------------------------------------------------------------- Wound Assessment Details Patient  Name: Date of Service: [name] 03/06/2020 3:15 PM Medical Record Number: [id number] Patient Account Number: [id number] Date of Birth/Sex: Treating RN: 04-Jan-1946 (74 y.o. [name] Primary Care Seryna Marek: [name] Other Clinician: Referring Hawley Pavia: Treating Tallula Grindle/Extender: Landis Martins Weeks in Treatment: 7 Wound Status Wound Number: 1 Primary Etiology: Diabetic Wound/Ulcer of the Lower Extremity Wound Location: Left, Medial, Plantar Foot Wound Status: Open Wounding Event: Gradually Appeared Date Acquired: 05/11/2019 Weeks Of Treatment: 7 Clustered Wound: No Wound Measurements Length: (cm) 0.7 Width: (cm) 1.2 Depth: (cm) 0.2 Area: (cm) 0.66 Volume: (cm) 0.132 % Reduction in Area: 35.4% % Reduction in Volume: 67.6% Wound Description Classification: Grade 2 Treatment Notes Wound #1 (Left, Medial, Plantar Foot) 1. Cleanse With Wound Cleanser Soap and water 3. Primary Dressing Applied Hydrofera Blue 4. Secondary Dressing Dry Gauze Roll Gauze 7. Footwear/Offloading device applied T Contact Cast otal Electronic Signature(s) Signed: 03/06/2020 6:04:59 PM By: Carlene Coria RN Entered By: Carlene Coria on 03/06/2020 16:32:56 -------------------------------------------------------------------------------- Vitals Details Patient Name: Date of Service: [name] 03/06/2020 3:15 PM Medical Record Number: [id number] Patient Account Number: [id number] Date of Birth/Sex: Treating RN: 09-19-1945 (74 y.o. [name] Primary Care Rohit Deloria: [name] Other Clinician: Referring Tiago Humphrey: Treating Samyiah Halvorsen/Extender: Worthy Keeler  [name] Weeks in Treatment: 7 Vital Signs Time Taken: 16:32 Temperature (F): 98.3 Height (in): 66 Pulse (bpm): 102 Weight (lbs): 245 Respiratory Rate (breaths/min): 18 Body Mass Index (BMI): 39.5 Blood Pressure (mmHg): 157/74 Reference Range: 80 - 120 mg / dl Electronic  Signature(s) Signed: 03/06/2020 6:04:59 PM By: Carlene Coria RN Entered By: Carlene Coria on 03/06/2020 16:32:28

## 2020-03-06 NOTE — Progress Notes (Addendum)
[name], [name] ([id number]) Visit Report for 03/06/2020 Chief Complaint Document Details Patient Name: Date of Service: [name] [name], [name] [name] 03/06/2020 3:15 PM Medical Record Number: [id number] Patient Account Number: [id number] Date of Birth/Sex: Treating RN: November 24, 1945 (74 y.o. [name] Other Clinician: Referring Provider: Treating Provider/Extender: Adele Schilder in Treatment: 7 Information Obtained from: Patient Chief Complaint Left foot ulcer Electronic Signature(s) Signed: 03/06/2020 3:25:16 PM By: Worthy Keeler PA-C Entered By: Worthy Keeler on 03/06/2020 15:25:15 -------------------------------------------------------------------------------- Debridement Details Patient Name: Date of Service: [name] [name] 03/06/2020 3:15 PM Medical Record Number: [id number] Patient Account Number: [id number] Date of Birth/Sex: Treating RN: 04/08/1946 (74 y.o. [name] Other Clinician: Referring Provider: Treating Provider/Extender: Adele Schilder in Treatment: 7 Debridement Performed for Assessment: Wound #1 Left,Medial,Plantar Foot Performed By: Physician Worthy Keeler, PA Debridement Type: Debridement Severity of Tissue Pre Debridement: Fat layer exposed Level of Consciousness (Pre-procedure): Awake and Alert Pre-procedure Verification/Time Out Yes - 16:55 Taken: Start Time: 16:58 T Area Debrided (L x W): otal 1.3 (cm) x 1.5 (cm) = 1.95 (cm) Tissue and other material debrided: Viable, Non-Viable, Callus, Slough, Subcutaneous, Skin: Epidermis, Slough Level: Skin/Subcutaneous Tissue Debridement Description: Excisional Instrument: Curette Bleeding: Minimum Hemostasis Achieved: Pressure End Time: 17:03 Procedural Pain: 0 Post Procedural Pain: 0 Response to Treatment: Procedure was tolerated well Level of Consciousness (Post- Awake and  Alert procedure): Post Debridement Measurements of Total Wound Length: (cm) 0.7 Width: (cm) 1.2 Depth: (cm) 0.2 Volume: (cm) 0.132 Character of Wound/Ulcer Post Debridement: Improved Severity of Tissue Post Debridement: Fat layer exposed Post Procedure Diagnosis Same as Pre-procedure Electronic Signature(s) Signed: 03/06/2020 6:38:59 PM By: [name] RN, BSN Signed: 03/06/2020 6:41:43 PM By: Worthy Keeler PA-C Entered By: [name] on 03/06/2020 17:02:13 -------------------------------------------------------------------------------- HPI Details Patient Name: Date of Service: [name] 03/06/2020 3:15 PM Medical Record Number: [id number] Patient Account Number: [id number] Date of Birth/Sex: Treating RN: 08/20/1945 (74 y.o. [name] Other Clinician: Referring Provider: Treating Provider/Extender: Adele Schilder in Treatment: 7 History of Present Illness HPI Description: 01/17/2020 upon evaluation today patient presents for initial evaluation here in our clinic concerning issues she has been having with a left medial/plantar foot ulcer. This is actually been an issue for her since October 2020. She has been seeing Dr. Doran Durand for quite some time during that course. Fortunately there is no signs of active infection at this time. Or least no mention of this to have seen in general. With that being said unfortunately I do see some signs of erythema noted today that does have me concerned about the possibility of infection at this point in the surrounding area of the wound. There is also a warm to touch at the site which is somewhat concerning. Fortunately there is no evidence of systemic infection which is great news. The patient does have a history of diabetes mellitus type 2, Charcot foot which is what led to the wound, and hypertension. She notes that she was in a cast for some time with Dr. Doran Durand for  about 8 weeks. During that time they were utilizing according to the patient silver nitrate along with a foam doughnut and then Coban to secure in place in the cast in place. With that being said I do not have the actual records to review we are going  to try to get a hold of those unfortunately they would not flow over into care everywhere I did look today. She has been seeing Dr. Doran Durand and his physician assistant Larkin Ina up until the end of May and apparently is still seeing them on a regular basis every 2 weeks roughly. She has also tried Iodosorb without effect here. 01/24/2020 upon evaluation today patient actually appears to be doing quite well with regard to her wounds. She has been tolerating the dressing changes without complication. Fortunately there is no signs of active infection spreading which is good news. Her culture did show signs of Staph aureus I did place her on Augmentin due to the erythema surrounding the wound. With that being said the wound does appear to be doing better she has her longer walking cast/boot and I think that is actually good for her for the time being. I am considering reinitiating total contact cast when she gets back from vacation but next week she will actually be out of town at [employer] she knows not to get in the water but she still obviously is planning to enjoy herself she is going to take it easy on her foot however. 02/07/2020 upon evaluation today patient appears to be doing fairly well in regard to her ulcer on her foot. Fortunately there is no signs of severe infection at this time which is great news and overall very pleased in that regard. With that being said I do think that she could still benefit from a total contact cast. Nonetheless she is using her walking boot which at least provide some protection and that it prevents some of the friction occurring when she is ambulating. 02/14/2020 upon evaluation today patient appears to be doing well with  regard to her foot ulcer. This is actually measuring a little bit smaller yet again this week. Overall very pleased with where things stand and I do not see any signs of active infection at this time which is also good news. Since she is measuring better the patient has wanting to somewhat hold off on proceeding with the total contact cast which I think is reasonable at this point. 02/28/2020 on evaluation today patient appears to be doing well in general in regard to her wound although she has a lot of callus buildup as compared to last time I saw her. This is can require sharp debridement today. I do believe she really needs the total contact cast as well which we have discussed previous. 7/23; patient comes in for a total contact cast change 03/06/2020 on evaluation today patient appears to be doing quite well with regard to her wounds. Fortunately the wound bed is measuring smaller and looking much better there is little callus noted although there is some debridement necessary today. Electronic Signature(s) Signed: 03/06/2020 6:40:20 PM By: Worthy Keeler PA-C Entered By: Worthy Keeler on 03/06/2020 18:40:20 -------------------------------------------------------------------------------- Physical Exam Details Patient Name: Date of Service: [name] [name], [name] [name] RBA RA E. 03/06/2020 3:15 PM Medical Record Number: [id number] Patient Account Number: [id number] Date of Birth/Sex: Treating RN: 09-Dec-1945 (74 y.o. [name] Other Clinician: Referring Provider: Treating Provider/Extender: Landis Martins Weeks in Treatment: 7 Constitutional Well-nourished and well-hydrated in no acute distress. Respiratory normal breathing without difficulty. Psychiatric this patient is able to make decisions and demonstrates good insight into disease process. Alert and Oriented x 3. pleasant and cooperative. Notes Patient's wound bed again showed some minimal  callus around the  edges of the wound although there is a lot of new epithelial growth and very pleased with where things stand. Fortunately there is no signs of infection at this time. Sharp debridement was performed to remove the callus and clean off the surface of the wound. Electronic Signature(s) Signed: 03/06/2020 6:40:36 PM By: Worthy Keeler PA-C Entered By: Worthy Keeler on 03/06/2020 18:40:36 -------------------------------------------------------------------------------- Physician Orders Details Patient Name: Date of Service: Fallen, [name] 03/06/2020 3:15 PM Medical Record Number: [id number] Patient Account Number: [id number] Date of Birth/Sex: Treating RN: 10-Dec-1945 (74 y.o. [name] Other Clinician: Referring Provider: Treating Provider/Extender: Adele Schilder in Treatment: 7 Verbal / Phone Orders: No Diagnosis Coding ICD-10 Coding Code Description E11.621 Type 2 diabetes mellitus with foot ulcer L97.522 Non-pressure chronic ulcer of other part of left foot with fat layer exposed M14.672 Charcot's joint, left ankle and foot E11.40 Type 2 diabetes mellitus with diabetic neuropathy, unspecified I10 Essential (primary) hypertension Follow-up Appointments ppointment in 1 week. - with Margarita Grizzle Return A Dressing Change Frequency Wound #1 Left,Medial,Plantar Foot Do not change entire dressing for one week. Wound Cleansing Wound #1 Left,Medial,Plantar Foot May shower with protection. Primary Wound Dressing Wound #1 Left,Medial,Plantar Foot Hydrofera Blue - classic moistened with saline cut to fit inside wound margins Secondary Dressing Wound #1 Left,Medial,Plantar Foot Foam - donut Kerlix/Rolled Gauze Dry Gauze Edema Control Avoid standing for long periods of time Elevate legs to the level of the heart or above for 30 minutes daily and/or when sitting, a frequency of: Off-Loading Total Contact Cast  to Left Lower Extremity Electronic Signature(s) Signed: 03/06/2020 6:38:59 PM By: [name] RN, BSN Signed: 03/06/2020 6:41:43 PM By: Worthy Keeler PA-C Entered By: [name] on 03/06/2020 17:04:55 -------------------------------------------------------------------------------- Problem List Details Patient Name: Date of Service: [name] [name] 03/06/2020 3:15 PM Medical Record Number: [id number] Patient Account Number: [id number] Date of Birth/Sex: Treating RN: 1946/05/03 (74 y.o. [name] Other Clinician: Referring Provider: Treating Provider/Extender: Adele Schilder in Treatment: 7 Active Problems ICD-10 Encounter Code Description Active Date MDM Diagnosis E11.621 Type 2 diabetes mellitus with foot ulcer 01/17/2020 No Yes L97.522 Non-pressure chronic ulcer of other part of left foot with fat layer exposed 01/17/2020 No Yes M14.672 Charcot's joint, left ankle and foot 01/17/2020 No Yes E11.40 Type 2 diabetes mellitus with diabetic neuropathy, unspecified 01/17/2020 No Yes I10 Essential (primary) hypertension 01/17/2020 No Yes Inactive Problems Resolved Problems Electronic Signature(s) Signed: 03/06/2020 3:25:09 PM By: Worthy Keeler PA-C Entered By: Worthy Keeler on 03/06/2020 15:25:09 -------------------------------------------------------------------------------- Progress Note Details Patient Name: Date of Service: Haffey, [name] 03/06/2020 3:15 PM Medical Record Number: [id number] Patient Account Number: [id number] Date of Birth/Sex: Treating RN: [birth year] (74 y.o. [name] Other Clinician: Referring Provider: Treating Provider/Extender: Adele Schilder in Treatment: 7 Subjective Chief Complaint Information obtained from Patient Left foot ulcer History of Present Illness (HPI) 01/17/2020 upon evaluation today patient presents  for initial evaluation here in our clinic concerning issues she has been having with a left medial/plantar foot ulcer. This is actually been an issue for her since October 2020. She has been seeing Dr. Doran Durand for quite some time during that course. Fortunately there is no signs of active infection at this time. Or least no mention of this to have seen in general. With that being  said unfortunately I do see some signs of erythema noted today that does have me concerned about the possibility of infection at this point in the surrounding area of the wound. There is also a warm to touch at the site which is somewhat concerning. Fortunately there is no evidence of systemic infection which is great news. The patient does have a history of diabetes mellitus type 2, Charcot foot which is what led to the wound, and hypertension. She notes that she was in a cast for some time with Dr. Doran Durand for about 8 weeks. During that time they were utilizing according to the patient silver nitrate along with a foam doughnut and then Coban to secure in place in the cast in place. With that being said I do not have the actual records to review we are going to try to get a hold of those unfortunately they would not flow over into care everywhere I did look today. She has been seeing Dr. Doran Durand and his physician assistant Larkin Ina up until the end of May and apparently is still seeing them on a regular basis every 2 weeks roughly. She has also tried Iodosorb without effect here. 01/24/2020 upon evaluation today patient actually appears to be doing quite well with regard to her wounds. She has been tolerating the dressing changes without complication. Fortunately there is no signs of active infection spreading which is good news. Her culture did show signs of Staph aureus I did place her on Augmentin due to the erythema surrounding the wound. With that being said the wound does appear to be doing better she has her longer walking  cast/boot and I think that is actually good for her for the time being. I am considering reinitiating total contact cast when she gets back from vacation but next week she will actually be out of town at [employer] she knows not to get in the water but she still obviously is planning to enjoy herself she is going to take it easy on her foot however. 02/07/2020 upon evaluation today patient appears to be doing fairly well in regard to her ulcer on her foot. Fortunately there is no signs of severe infection at this time which is great news and overall very pleased in that regard. With that being said I do think that she could still benefit from a total contact cast. Nonetheless she is using her walking boot which at least provide some protection and that it prevents some of the friction occurring when she is ambulating. 02/14/2020 upon evaluation today patient appears to be doing well with regard to her foot ulcer. This is actually measuring a little bit smaller yet again this week. Overall very pleased with where things stand and I do not see any signs of active infection at this time which is also good news. Since she is measuring better the patient has wanting to somewhat hold off on proceeding with the total contact cast which I think is reasonable at this point. 02/28/2020 on evaluation today patient appears to be doing well in general in regard to her wound although she has a lot of callus buildup as compared to last time I saw her. This is can require sharp debridement today. I do believe she really needs the total contact cast as well which we have discussed previous. 7/23; patient comes in for a total contact cast change 03/06/2020 on evaluation today patient appears to be doing quite well with regard to her wounds. Fortunately the wound bed  is measuring smaller and looking much better there is little callus noted although there is some debridement necessary  today. Objective Constitutional Well-nourished and well-hydrated in no acute distress. Vitals Time Taken: 4:32 PM, Height: 66 in, Weight: 245 lbs, BMI: 39.5, Temperature: 98.3 F, Pulse: 102 bpm, Respiratory Rate: 18 breaths/min, Blood Pressure: 157/74 mmHg. Respiratory normal breathing without difficulty. Psychiatric this patient is able to make decisions and demonstrates good insight into disease process. Alert and Oriented x 3. pleasant and cooperative. General Notes: Patient's wound bed again showed some minimal callus around the edges of the wound although there is a lot of new epithelial growth and very pleased with where things stand. Fortunately there is no signs of infection at this time. Sharp debridement was performed to remove the callus and clean off the surface of the wound. Integumentary (Hair, Skin) Wound #1 status is Open. Original cause of wound was Gradually Appeared. The wound is located on the [place]. The wound measures 0.7cm length x 1.2cm width x 0.2cm depth; 0.66cm^2 area and 0.132cm^3 volume. Assessment Active Problems ICD-10 Type 2 diabetes mellitus with foot ulcer Non-pressure chronic ulcer of other part of left foot with fat layer exposed Charcot's joint, left ankle and foot Type 2 diabetes mellitus with diabetic neuropathy, unspecified Essential (primary) hypertension Procedures Wound #1 Pre-procedure diagnosis of Wound #1 is a Diabetic Wound/Ulcer of the Lower Extremity located on the Left,Medial,Plantar Foot .Severity of Tissue Pre Debridement is: Fat layer exposed. There was a Excisional Skin/Subcutaneous Tissue Debridement with a total area of 1.95 sq cm performed by Worthy Keeler, PA. With the following instrument(s): Curette to remove Viable and Non-Viable tissue/material. Material removed includes Callus, Subcutaneous Tissue, Slough, and Skin: Epidermis. No specimens were taken. A time out was conducted at 16:55, prior to the start  of the procedure. A Minimum amount of bleeding was controlled with Pressure. The procedure was tolerated well with a pain level of 0 throughout and a pain level of 0 following the procedure. Post Debridement Measurements: 0.7cm length x 1.2cm width x 0.2cm depth; 0.132cm^3 volume. Character of Wound/Ulcer Post Debridement is improved. Severity of Tissue Post Debridement is: Fat layer exposed. Post procedure Diagnosis Wound #1: Same as Pre-Procedure Pre-procedure diagnosis of Wound #1 is a Diabetic Wound/Ulcer of the Lower Extremity located on the Left,Medial,Plantar Foot . There was a T Contact otal Cast Procedure by Worthy Keeler, PA. Post procedure Diagnosis Wound #1: Same as Pre-Procedure Plan Follow-up Appointments: Return Appointment in 1 week. - with Margarita Grizzle Dressing Change Frequency: Wound #1 Left,Medial,Plantar Foot: Do not change entire dressing for one week. Wound Cleansing: Wound #1 Left,Medial,Plantar Foot: May shower with protection. Primary Wound Dressing: Wound #1 Left,Medial,Plantar Foot: Hydrofera Blue - classic moistened with saline cut to fit inside wound margins Secondary Dressing: Wound #1 Left,Medial,Plantar Foot: Foam - donut Kerlix/Rolled Gauze Dry Gauze Edema Control: Avoid standing for long periods of time Elevate legs to the level of the heart or above for 30 minutes daily and/or when sitting, a frequency of: Off-Loading: T Contact Cast to Left Lower Extremity otal 1 I am going to suggest that we go ahead and continue with the [facility] I think that is a good dressing of choice. 2. I am also can I suggest that we continue with the total contact cast that was reapplied by myself today as well. 3. Next week we did discussed that since she is going to a conference the following week we are going to hold off on the  cast next Wednesday and give her a break for a week so she does not have to have it during the conference and then we will subsequently see  her back for reevaluation the following week to reapply. She is in agreement with that plan. We will see patient back for reevaluation in 1 week here in the clinic. If anything worsens or changes patient will contact our office for additional recommendations. Electronic Signature(s) Signed: 03/06/2020 6:41:19 PM By: Worthy Keeler PA-C Entered By: Worthy Keeler on 03/06/2020 18:41:19 -------------------------------------------------------------------------------- Total Contact Cast Details Patient Name: Date of Service: [name] [name], MANNINA RA E. 03/06/2020 3:15 PM Medical Record Number: [id number] Patient Account Number: [id number] Date of Birth/Sex: Treating RN: 1945/11/25 (74 y.o. [name] Other Clinician: Referring Provider: Treating Provider/Extender: Adele Schilder in Treatment: 7 T Contact Cast Applied for Wound Assessment: otal Wound #1 Left,Medial,Plantar Foot Performed By: Physician Worthy Keeler, PA Post Procedure Diagnosis Same as Pre-procedure Electronic Signature(s) Signed: 03/06/2020 6:38:59 PM By: [name] RN, BSN Signed: 03/06/2020 6:41:43 PM By: Worthy Keeler PA-C Entered By: [name] on 03/06/2020 17:00:45 -------------------------------------------------------------------------------- SuperBill Details Patient Name: Date of Service: [name] 03/06/2020 Medical Record Number: [id number] Patient Account Number: [id number] Date of Birth/Sex: Treating RN: [birth year] (74 y.o. [name] Other Clinician: Referring Provider: Treating Provider/Extender: Adele Schilder in Treatment: 7 Diagnosis Coding ICD-10 Codes Code Description E11.621 Type 2 diabetes mellitus with foot ulcer L97.522 Non-pressure chronic ulcer of other part of left foot with fat layer exposed M14.672 Charcot's joint, left ankle and foot E11.40  Type 2 diabetes mellitus with diabetic neuropathy, unspecified I10 Essential (primary) hypertension Facility Procedures CPT4 Code: [id number] Description: 21194 - DEB SUBQ TISSUE 20 SQ CM/< ICD-10 Diagnosis Description L97.522 Non-pressure chronic ulcer of other part of left foot with fat layer exposed Modifier: Quantity: 1 Physician Procedures : CPT4 Code Description Modifier [id number] 11042 - WC PHYS SUBQ TISS 20 SQ CM ICD-10 Diagnosis Description L97.522 Non-pressure chronic ulcer of other part of left foot with fat layer exposed Quantity: 1 Electronic Signature(s) Signed: 03/06/2020 6:41:27 PM By: Worthy Keeler PA-C Previous Signature: 03/06/2020 6:38:59 PM Version By: [name] RN, BSN Entered By: Worthy Keeler on 03/06/2020 18:41:27

## 2020-03-09 ENCOUNTER — Encounter: Payer: Self-pay | Admitting: Internal Medicine

## 2020-03-11 DIAGNOSIS — Z20822 Contact with and (suspected) exposure to covid-19: Secondary | ICD-10-CM | POA: Diagnosis not present

## 2020-03-12 ENCOUNTER — Encounter (HOSPITAL_BASED_OUTPATIENT_CLINIC_OR_DEPARTMENT_OTHER): Payer: Self-pay | Admitting: *Deleted

## 2020-03-12 ENCOUNTER — Other Ambulatory Visit: Payer: Self-pay

## 2020-03-12 ENCOUNTER — Emergency Department (HOSPITAL_BASED_OUTPATIENT_CLINIC_OR_DEPARTMENT_OTHER)
Admission: EM | Admit: 2020-03-12 | Discharge: 2020-03-12 | Disposition: A | Discharge status: Home / Self Care | Payer: Medicare Other | Attending: Emergency Medicine | Admitting: Emergency Medicine

## 2020-03-12 DIAGNOSIS — M79662 Pain in left lower leg: Secondary | ICD-10-CM | POA: Insufficient documentation

## 2020-03-12 DIAGNOSIS — Z794 Long term (current) use of insulin: Secondary | ICD-10-CM | POA: Insufficient documentation

## 2020-03-12 DIAGNOSIS — Z79899 Other long term (current) drug therapy: Secondary | ICD-10-CM | POA: Diagnosis not present

## 2020-03-12 DIAGNOSIS — E119 Type 2 diabetes mellitus without complications: Secondary | ICD-10-CM | POA: Diagnosis not present

## 2020-03-12 DIAGNOSIS — Z96641 Presence of right artificial hip joint: Secondary | ICD-10-CM | POA: Diagnosis not present

## 2020-03-12 DIAGNOSIS — M79605 Pain in left leg: Secondary | ICD-10-CM

## 2020-03-12 DIAGNOSIS — Z7982 Long term (current) use of aspirin: Secondary | ICD-10-CM | POA: Insufficient documentation

## 2020-03-12 DIAGNOSIS — R2242 Localized swelling, mass and lump, left lower limb: Secondary | ICD-10-CM | POA: Diagnosis not present

## 2020-03-12 DIAGNOSIS — I10 Essential (primary) hypertension: Secondary | ICD-10-CM | POA: Diagnosis not present

## 2020-03-12 DIAGNOSIS — R238 Other skin changes: Secondary | ICD-10-CM | POA: Diagnosis not present

## 2020-03-12 DIAGNOSIS — R011 Cardiac murmur, unspecified: Secondary | ICD-10-CM | POA: Diagnosis not present

## 2020-03-12 LAB — CBC WITH DIFFERENTIAL/PLATELET
Abs Immature Granulocytes: 0.14 10*3/uL — ABNORMAL HIGH (ref 0.00–0.07)
Basophils Absolute: 0 10*3/uL (ref 0.0–0.1)
Basophils Relative: 0 %
Eosinophils Absolute: 0 10*3/uL (ref 0.0–0.5)
Eosinophils Relative: 0 %
HCT: 37.7 % (ref 36.0–46.0)
Hemoglobin: 11.9 g/dL — ABNORMAL LOW (ref 12.0–15.0)
Immature Granulocytes: 1 %
Lymphocytes Relative: 13 %
Lymphs Abs: 1.6 10*3/uL (ref 0.7–4.0)
MCH: 28.8 pg (ref 26.0–34.0)
MCHC: 31.6 g/dL (ref 30.0–36.0)
MCV: 91.3 fL (ref 80.0–100.0)
Monocytes Absolute: 0.5 10*3/uL (ref 0.1–1.0)
Monocytes Relative: 4 %
Neutro Abs: 9.3 10*3/uL — ABNORMAL HIGH (ref 1.7–7.7)
Neutrophils Relative %: 82 %
Platelets: 214 10*3/uL (ref 150–400)
RBC: 4.13 MIL/uL (ref 3.87–5.11)
RDW: 16.5 % — ABNORMAL HIGH (ref 11.5–15.5)
WBC: 11.5 10*3/uL — ABNORMAL HIGH (ref 4.0–10.5)
nRBC: 0 % (ref 0.0–0.2)

## 2020-03-12 LAB — BASIC METABOLIC PANEL
Anion gap: 14 (ref 5–15)
BUN: 23 mg/dL (ref 8–23)
CO2: 26 mmol/L (ref 22–32)
Calcium: 9.3 mg/dL (ref 8.9–10.3)
Chloride: 93 mmol/L — ABNORMAL LOW (ref 98–111)
Creatinine, Ser: 0.78 mg/dL (ref 0.44–1.00)
GFR calc Af Amer: >60 mL/min (ref >60)
GFR calc non Af Amer: >60 mL/min (ref >60)
Glucose, Bld: 257 mg/dL — ABNORMAL HIGH (ref 70–99)
Potassium: 4.7 mmol/L (ref 3.5–5.1)
Sodium: 133 mmol/L — ABNORMAL LOW (ref 135–145)

## 2020-03-12 LAB — HEPATIC FUNCTION PANEL
ALT: 45 U/L — ABNORMAL HIGH (ref 0–44)
AST: 41 U/L (ref 15–41)
Albumin: 2.9 g/dL — ABNORMAL LOW (ref 3.5–5.0)
Alkaline Phosphatase: 45 U/L (ref 38–126)
Bilirubin, Direct: 0.1 mg/dL (ref 0.0–0.2)
Indirect Bilirubin: 0.1 mg/dL — ABNORMAL LOW (ref 0.3–0.9)
Total Bilirubin: 0.2 mg/dL — ABNORMAL LOW (ref 0.3–1.2)
Total Protein: 6.8 g/dL (ref 6.5–8.1)

## 2020-03-12 LAB — D-DIMER, QUANTITATIVE: D-Dimer, Quant: 0.83 ug/mL-FEU — ABNORMAL HIGH (ref 0.00–0.50)

## 2020-03-12 MED ORDER — AMOXICILLIN-POT CLAVULANATE 875-125 MG PO TABS
1.0000 | ORAL_TABLET | Freq: Two times a day (BID) | ORAL | 0 refills | Status: DC
Start: 2020-03-12 — End: 2020-03-16

## 2020-03-12 MED ORDER — AMOXICILLIN-POT CLAVULANATE 875-125 MG PO TABS
1.0000 | ORAL_TABLET | Freq: Once | ORAL | Status: AC
  Administered 2020-03-12: 1 via ORAL
  Filled 2020-03-12: qty 1

## 2020-03-12 MED ORDER — APIXABAN 2.5 MG PO TABS
10.0000 mg | ORAL_TABLET | Freq: Once | ORAL | Status: AC
  Administered 2020-03-12: 10 mg via ORAL
  Filled 2020-03-12: qty 4

## 2020-03-12 NOTE — ED Triage Notes (Signed)
C/o left lower leg wound infection being seen by WOund clinic today c/o increased swelling

## 2020-03-12 NOTE — Discharge Instructions (Signed)
Follow with wound clinic tomorrow and return for DVT ultrasound.  Please return to the ED if symptoms worsen as discussed.

## 2020-03-12 NOTE — ED Provider Notes (Signed)
[facility] EMERGENCY DEPARTMENT Provider Note   CSN: [id number] Arrival date & time: 03/12/20  1553     History Chief Complaint  Patient presents with  . Wound Infection    [name] [name] is a 74 y.o. female.  Patient with history of DVT, diabetes, chronic foot wounds who presents the ED with left lower leg swelling.  Patient concern for possible DVT.  Possible infectious process.  Patient denies any shortness of breath, abdominal pain.  Patient however does have a hard cast on her left lower leg as she is followed by wound care for this chronic wound on her left foot.  She gets checked weekly and has a new cast placed every week.  She noticed some redness to her anterior portion of the shin that is still exposed that is not covered by her cast today.  May be some increased swelling.  Nothing has made it worse or better.  Maybe had a fever several days ago but has not had a fever over the last several days.  No current fever.  The history is provided by the patient.  Illness Associated symptoms: no abdominal pain, no chest pain, no cough, no ear pain, no fever, no rash, no shortness of breath, no sore throat and no vomiting        Past Medical History:  Diagnosis Date  . Arthritis   . Closed fracture of fifth metatarsal bone 05/13/2018  . Deep vein thrombosis ([place])    right calf - 05/2012   . Diabetes mellitus without complication (HCC)    diet controlled   . GERD (gastroesophageal reflux disease)   . Hyperlipidemia   . Hypertension    Ejection fraction =>55% Left ventricular systolic function is normal. Left ventricular wall motion is normal    . Lymphocytic colitis   . Neuropathy    diabetic - in bilateral feet   . Pityriasis lichenoides chronica   . Sleep apnea    bipap    Patient Active Problem List   Diagnosis Date Noted  . Fatigue 11/13/2019  . Dyslipidemia 11/12/2019  . Pityriasis lichenoides chronica 09/18/2019  . Nausea 09/18/2019  . Insomnia  08/07/2019  . Anxiety 08/07/2019  . Psoriatic arthritis ([place]) 08/07/2019  . H/O: gout 08/07/2019  . Recurrent UTI 08/07/2019  . Charcot's joint of left foot 08/07/2019  . Depression 08/07/2019  . Pneumonia due to COVID-19 virus 07/09/2019  . Physical deconditioning 07/08/2019  . COVID-19 07/07/2019  . Diabetic peripheral neuropathy ([place]) 04/10/2019  . Pain in left knee 10/13/2018  . Ischemic colitis ([place]) 10/06/2018  . Arthritis 07/21/2018  . Urinary incontinence 07/21/2018  . Diabetic foot ulcer ([place]) 07/13/2018  . Cellulitis of toe of left foot 05/02/2018  . DM2 (diabetes mellitus, type 2) ([place]) 05/02/2018  . Psoriasis 05/02/2018  . Osteoarthritis of knee 10/18/2017  . Chest pain 01/12/2015  . Right bundle branch block 05/20/2014  . OSA (obstructive sleep apnea), BiPAP 03/12/2013  . Essential hypertension 03/12/2013  . Obesity 09/14/2012  . Hyponatremia 09/14/2012  . S/P right THA, AA 09/13/2012  . Deep venous thrombosis of lower extremity ([place]) 08/10/2010  . 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 DILATION AND CURETTAGE OF UTERUS    . HAMMER TOE SURGERY    . right hand surgery      due to blood infection   . torn meniscus repair      right knee   . TOTAL HIP  ARTHROPLASTY  09/13/2012   Procedure: TOTAL HIP ARTHROPLASTY ANTERIOR APPROACH;  Surgeon: Mauri Pole, MD;  Location: [facility];  Service: Orthopedics;  Laterality: Right;     OB History   No obstetric history on file.     Family History  Problem Relation Age of Onset  . Hypertension Mother     Social History   Tobacco Use  . Smoking status: Never Smoker  . Smokeless tobacco: Never Used  Substance Use Topics  . Alcohol use: Yes    Comment: occasional wine   . Drug use: No    Home Medications Prior to Admission medications   Medication Sig Start Date End Date Taking? Authorizing Provider  acetaminophen (TYLENOL) 325 MG tablet Take 2  tablets (650 mg total) by mouth every 6 (six) hours as needed for mild pain or headache. 07/13/19   Elgergawy, Silver Huguenin, MD  allopurinol (ZYLOPRIM) 100 MG tablet Take 100 mg by mouth 2 (two) times daily.    [provider]  ALPRAZolam Duanne Moron) 0.25 MG tablet Take 1 tablet (0.25 mg total) by mouth 2 (two) times daily as needed for anxiety. 11/13/19   Binnie Rail, MD  amoxicillin-clavulanate (AUGMENTIN) 875-125 MG tablet Take 1 tablet by mouth 2 (two) times daily for 10 days. 03/12/20 03/22/20  Evalena Fujii, DO  aspirin 81 MG tablet Take 81 mg by mouth every other day.     [provider]  azelastine (ASTELIN) 0.1 % nasal spray Place 2 sprays into both nostrils 2 (two) times daily. Use in each nostril as directed 12/26/19   Binnie Rail, MD  Budesonide (UCERIS) 9 MG TB24 Take 9 mg by mouth daily.     [provider]  Certolizumab Pegol (CIMZIA [place]) Inject 400 mg into the skin every 30 (thirty) days. 200 mg on each side of stomach    [provider]  Cholecalciferol (VITAMIN D3) 1000 UNITS CAPS Take 1,000 Units by mouth 2 (two) times daily.     [provider]  citalopram (CELEXA) 40 MG tablet Take 40 mg by mouth daily.    [provider]  Cyanocobalamin (VITAMIN B-12) 1000 MCG SUBL Place 1,000 mcg under the tongue daily.    [provider]  cyclobenzaprine (FLEXERIL) 10 MG tablet TAKE 1 TABLET BY MOUTH AT BEDTIME 03/06/20   Binnie Rail, MD  docusate sodium (COLACE) 100 MG capsule Take 1 capsule (100 mg total) by mouth 2 (two) times daily. 08/30/19   Binnie Rail, MD  fluticasone (FLONASE) 50 MCG/ACT nasal spray Place 2 sprays into both nostrils daily. 11/13/19   Binnie Rail, MD  folic acid (FOLVITE) 1 MG tablet Take 1 tablet (1 mg total) by mouth daily. 11/13/19   Binnie Rail, MD  Insulin Pen Needle (B-D UF III MINI PEN NEEDLES) 31G X 5 MM MISC Use daily for insulin pen 01/23/20   Burns, Claudina Lick, MD  LEVEMIR FLEXTOUCH 100 UNIT/ML FlexPen  18 Units. 12/07/19   [provider]  lisinopril-hydrochlorothiazide (ZESTORETIC) 20-25 MG tablet Take 1 tablet by mouth daily. 05/25/19   [provider]  loperamide (IMODIUM) 2 MG capsule Take 6 mg by mouth 2 (two) times daily.    [provider]  Melatonin 5 MG SUBL Place 5 mg under the tongue at bedtime.    [provider]  metFORMIN (GLUCOPHAGE-XR) 500 MG 24 hr tablet Take 2 tablets (1,000 mg total) by mouth 2 (two) times daily. 01/05/20   Binnie Rail,  MD  methotrexate (RHEUMATREX) 2.5 MG tablet Take 8 tablets (20 mg total) by mouth once a week. Caution: Chemotherapy. Protect from light. Takes on Friday - hold for 2 weeks. Patient not taking: Reported on 01/12/2020 07/13/19   Elgergawy, Silver Huguenin, MD  mupirocin cream (BACTROBAN) 2 % Apply 1 application topically 2 (two) times daily as needed (psoriasis).     [provider]  omeprazole (PRILOSEC) 40 MG capsule Take 40 mg by mouth at bedtime.    [provider]  potassium chloride (KLOR-CON) 10 MEQ tablet Take 2 tablets (20 mEq total) by mouth daily. 11/13/19   Binnie Rail, MD  Probiotic Product (ALIGN PO) Take 1 capsule by mouth daily.    [provider]  rosuvastatin (CRESTOR) 5 MG tablet Take 1 tablet (5 mg total) by mouth daily. 08/21/19   Binnie Rail, MD  terbinafine (LAMISIL) 250 MG tablet Take 1 tablet (250 mg total) by mouth daily. 09/22/19   Binnie Rail, MD  triamcinolone cream (KENALOG) 0.1 % Apply 1 application topically 2 (two) times daily. 09/18/19   Binnie Rail, MD    Allergies    Other, Bee venom, Farxiga [dapagliflozin], and Sulfa antibiotics  Review of Systems   Review of Systems  Constitutional: Negative for chills and fever.  HENT: Negative for ear pain and sore throat.   Eyes: Negative for pain and visual disturbance.  Respiratory: Negative for cough and shortness of breath.   Cardiovascular: Positive for leg swelling. Negative for chest pain and  palpitations.  Gastrointestinal: Negative for abdominal pain and vomiting.  Genitourinary: Negative for dysuria and hematuria.  Musculoskeletal: Negative for arthralgias and back pain.  Skin: Positive for color change. Negative for rash.  Neurological: Negative for seizures and syncope.  All other systems reviewed and are negative.   Physical Exam Updated Vital Signs BP (!) 143/79 (BP Location: Right Wrist)   Pulse 82   Temp 98.6 F (37 C) (Oral)   Resp 18   Ht 5\' 6"  (1.676 m)   Wt 111.1 kg   SpO2 100%   BMI 39.54 kg/m   Physical Exam Vitals and nursing note reviewed.  Constitutional:      General: She is not in acute distress.    Appearance: She is well-developed. She is not ill-appearing.  HENT:     Head: Normocephalic and atraumatic.  Cardiovascular:     Heart sounds: Murmur heard.   Pulmonary:     Effort: Pulmonary effort is normal.  Musculoskeletal:        General: Swelling present. No tenderness.     Cervical back: Neck supple.     Comments: Possibly some mild swelling to the left lower leg but appears fairly symmetric to right lower leg  Skin:    General: Skin is warm and dry.     Comments: Redness just above cast in the left lower leg anteriorly but no redness posterior to the left lower leg  Neurological:     Mental Status: She is alert.     ED Results / Procedures / Treatments   Labs (all labs ordered are listed, but only abnormal results are displayed) Labs Reviewed  CBC WITH DIFFERENTIAL/PLATELET - Abnormal; Notable for the following components:      Result Value   WBC 11.5 (*)    Hemoglobin 11.9 (*)    RDW 16.5 (*)    Neutro Abs 9.3 (*)    Abs Immature Granulocytes 0.14 (*)    All other components  within normal limits  BASIC METABOLIC PANEL - Abnormal; Notable for the following components:   Sodium 133 (*)    Chloride 93 (*)    Glucose, Bld 257 (*)    All other components within normal limits  HEPATIC FUNCTION PANEL - Abnormal; Notable for  the following components:   Albumin 2.9 (*)    ALT 45 (*)    Total Bilirubin 0.2 (*)    Indirect Bilirubin 0.1 (*)    All other components within normal limits  D-DIMER, QUANTITATIVE (NOT AT [facility]) - Abnormal; Notable for the following components:   D-Dimer, Quant 0.83 (*)    All other components within normal limits    EKG None  Radiology No results found.  Procedures Procedures (including critical care time)  Medications Ordered in ED Medications  apixaban (ELIQUIS) tablet 10 mg (10 mg Oral Given 03/12/20 2049)  amoxicillin-clavulanate (AUGMENTIN) 875-125 MG per tablet 1 tablet (1 tablet Oral Given 03/12/20 2050)    ED Course  I have reviewed the triage vital signs and the nursing notes.  Pertinent labs & imaging results that were available during my care of the patient were reviewed by me and considered in my medical decision making (see chart for details).    MDM Rules/Calculators/A&P                          [name] [name] is a 74 year old female with history of diabetes, DVT not on anticoagulation who presents to the ED with left lower leg swelling.  Patient with normal vitals.  No fever.  Has chronic left foot wound that is being managed with weekly cast and is followed weekly by wound care.  Has not been on antibiotics for a couple months.  Noticed some leg swelling on the left just above the cast with some redness anteriorly.  No major swelling when comparing legs side to side.  Not having any extreme pain and doubt any type of compartment syndrome.  Cast has been on for the last 6 days and only some swelling and discomfort started today.  With no fever, normal vitals.  May be had a fever several days ago but has not had one since.  Had a negative Covid test during that time.  No significant anemia, electrolyte abnormality.  Mild leukocytosis.    Overall shared decision was made as patient was hesitant to have me remove her cast.  We also did not have ultrasound at the time of  her arrival as well.  D-dimer was elevated and therefore patient was given a dose of Eliquis to treat for possible blood clot.  After shared decision with the patient and her family we decided that since she does have follow-up with wound clinic tomorrow that we will give a dose of Augmentin now as well and have her follow-up tomorrow to have the cast removed and have her further evaluated from an infectious standpoint. As there is low concern for systmeic infection. At that time she will not have a cast put back on and will have a DVT study ordered for her to come back tomorrow to get her leg scan for DVT.  She understands return precautions.  Patient discharged in good condition.  This chart was dictated using voice recognition software.  Despite best efforts to proofread,  errors can occur which can change the documentation meaning.    Final Clinical Impression(s) / ED Diagnoses Final diagnoses:  Pain of left lower extremity  Rx / DC Orders ED Discharge Orders         Ordered    US Venous Img Lower Unilateral Left     Discontinue     03/12/20 2050    amoxicillin-clavulanate (AUGMENTIN) 875-125 MG tablet  2 times daily     Discontinue  Reprint     03/12/20 2051           Lennice Sites, DO 03/12/20 2101

## 2020-03-12 NOTE — ED Notes (Signed)
Pt's family member is at bedside. Dr. Ronnald Nian made aware.

## 2020-03-12 NOTE — ED Notes (Signed)
ED Provider at bedside. 

## 2020-03-12 NOTE — ED Notes (Signed)
MD aware of pt , no new orders

## 2020-03-13 ENCOUNTER — Ambulatory Visit: Payer: Medicare Other

## 2020-03-13 ENCOUNTER — Encounter (HOSPITAL_BASED_OUTPATIENT_CLINIC_OR_DEPARTMENT_OTHER): Payer: Medicare Other | Attending: Physician Assistant | Admitting: Physician Assistant

## 2020-03-13 DIAGNOSIS — M1990 Unspecified osteoarthritis, unspecified site: Secondary | ICD-10-CM | POA: Diagnosis not present

## 2020-03-13 DIAGNOSIS — E1151 Type 2 diabetes mellitus with diabetic peripheral angiopathy without gangrene: Secondary | ICD-10-CM | POA: Insufficient documentation

## 2020-03-13 DIAGNOSIS — Z6839 Body mass index (BMI) 39.0-39.9, adult: Secondary | ICD-10-CM | POA: Insufficient documentation

## 2020-03-13 DIAGNOSIS — L97522 Non-pressure chronic ulcer of other part of left foot with fat layer exposed: Secondary | ICD-10-CM | POA: Insufficient documentation

## 2020-03-13 DIAGNOSIS — E1140 Type 2 diabetes mellitus with diabetic neuropathy, unspecified: Secondary | ICD-10-CM | POA: Insufficient documentation

## 2020-03-13 DIAGNOSIS — M109 Gout, unspecified: Secondary | ICD-10-CM | POA: Insufficient documentation

## 2020-03-13 DIAGNOSIS — I10 Essential (primary) hypertension: Secondary | ICD-10-CM | POA: Diagnosis not present

## 2020-03-13 DIAGNOSIS — E669 Obesity, unspecified: Secondary | ICD-10-CM | POA: Diagnosis not present

## 2020-03-13 DIAGNOSIS — E11621 Type 2 diabetes mellitus with foot ulcer: Secondary | ICD-10-CM | POA: Diagnosis not present

## 2020-03-13 NOTE — Progress Notes (Addendum)
KOA, PALLA ([id number]) Visit Report for 03/13/2020 Chief Complaint Document Details Patient Name: Date of Service: [name] [name], [name] [name] E. 03/13/2020 2:00 PM Medical Record Number: [id number] Patient Account Number: [id number] Date of Birth/Sex: Treating RN: Feb 26, 1946 (74 y.o. [name] Other Clinician: Referring Provider: Treating Provider/Extender: Adele Schilder in Treatment: 8 Information Obtained from: Patient Chief Complaint Left foot ulcer Electronic Signature(s) Signed: 03/13/2020 2:43:10 PM By: Worthy Keeler PA-C Entered By: Worthy Keeler on 03/13/2020 14:43:09 -------------------------------------------------------------------------------- HPI Details Patient Name: Date of Service: [name] [name], [name] [name] E. 03/13/2020 2:00 PM Medical Record Number: [id number] Patient Account Number: [id number] Date of Birth/Sex: Treating RN: [birth year] (74 y.o. [name] Other Clinician: Referring Provider: Treating Provider/Extender: Adele Schilder in Treatment: 8 History of Present Illness HPI Description: 01/17/2020 upon evaluation today patient presents for initial evaluation here in our clinic concerning issues she has been having with a left medial/plantar foot ulcer. This is actually been an issue for her since October 2020. She has been seeing Dr. Doran Durand for quite some time during that course. Fortunately there is no signs of active infection at this time. Or least no mention of this to have seen in general. With that being said unfortunately I do see some signs of erythema noted today that does have me concerned about the possibility of infection at this point in the surrounding area of the wound. There is also a warm to touch at the site which is somewhat concerning. Fortunately there is no evidence of systemic infection which is great news. The patient does have a  history of diabetes mellitus type 2, Charcot foot which is what led to the wound, and hypertension. She notes that she was in a cast for some time with Dr. Doran Durand for about 8 weeks. During that time they were utilizing according to the patient silver nitrate along with a foam doughnut and then Coban to secure in place in the cast in place. With that being said I do not have the actual records to review we are going to try to get a hold of those unfortunately they would not flow over into care everywhere I did look today. She has been seeing Dr. Doran Durand and his physician assistant Larkin Ina up until the end of May and apparently is still seeing them on a regular basis every 2 weeks roughly. She has also tried Iodosorb without effect here. 01/24/2020 upon evaluation today patient actually appears to be doing quite well with regard to her wounds. She has been tolerating the dressing changes without complication. Fortunately there is no signs of active infection spreading which is good news. Her culture did show signs of Staph aureus I did place her on Augmentin due to the erythema surrounding the wound. With that being said the wound does appear to be doing better she has her longer walking cast/boot and I think that is actually good for her for the time being. I am considering reinitiating total contact cast when she gets back from vacation but next week she will actually be out of town at [employer] she knows not to get in the water but she still obviously is planning to enjoy herself she is going to take it easy on her foot however. 02/07/2020 upon evaluation today patient appears to be doing fairly well in regard to her ulcer on her foot. Fortunately there is no signs of  severe infection at this time which is great news and overall very pleased in that regard. With that being said I do think that she could still benefit from a total contact cast. Nonetheless she is using her walking boot which at least  provide some protection and that it prevents some of the friction occurring when she is ambulating. 02/14/2020 upon evaluation today patient appears to be doing well with regard to her foot ulcer. This is actually measuring a little bit smaller yet again this week. Overall very pleased with where things stand and I do not see any signs of active infection at this time which is also good news. Since she is measuring better the patient has wanting to somewhat hold off on proceeding with the total contact cast which I think is reasonable at this point. 02/28/2020 on evaluation today patient appears to be doing well in general in regard to her wound although she has a lot of callus buildup as compared to last time I saw her. This is can require sharp debridement today. I do believe she really needs the total contact cast as well which we have discussed previous. 7/23; patient comes in for a total contact cast change 03/06/2020 on evaluation today patient appears to be doing quite well with regard to her wounds. Fortunately the wound bed is measuring smaller and looking much better there is little callus noted although there is some debridement necessary today. 03/13/2020 on evaluation today patient's wound actually appears to be doing excellent which is great news. With that being said unfortunately she is having some issues currently with her left leg where she does have cellulitis it appears. This may have come from an area that rubbed underneath the cast from last week that we noted we padded that area and it looks to be doing excellent at this point but nonetheless the leg was somewhat painful, swollen, and somewhat erythematous. She also had an elevated white blood cell count of 11.5 based on what I saw on looking at her records from the med center in [facility] from where she was seen yesterday. Unfortunately with [place] having a provider on vacation there was no one here in the clinic in the afternoon when she  called therefore she went to the ER as advised. Subsequently they did not cut off the cast as they did not have anyone from orthopedics there to do so and subsequently also did not have the ability to do the Doppler for evaluation of DVT They recommended therefore given her dose of Eliquis as well as . Augmentin and sent her home to come see [place] today to have the cast taken off and then she is supposed to go back to have the study for DVT performed they are following. Electronic Signature(s) Signed: 03/13/2020 4:53:52 PM By: Worthy Keeler PA-C Entered By: Worthy Keeler on 03/13/2020 16:53:52 -------------------------------------------------------------------------------- Physical Exam Details Patient Name: Date of Service: [name] [name], [name] [name] E. 03/13/2020 2:00 PM Medical Record Number: [id number] Patient Account Number: [id number] Date of Birth/Sex: Treating RN: 1946-03-03 (74 y.o. [name] Other Clinician: Referring Provider: Treating Provider/Extender: Landis Martins Weeks in Treatment: 8 Constitutional Obese and well-hydrated in no acute distress. Respiratory normal breathing without difficulty. Psychiatric this patient is able to make decisions and demonstrates good insight into disease process. Alert and Oriented x 3. pleasant and cooperative. Notes Upon inspection here today the patient's wound actually appears to be doing excellent  there is no signs of infection around the wound and the cellulitis all appears to be in the leg from the ankle up to the knee and not involving the foot at all which is good news for the wound but unfortunate that she is having a completely separate issue. I believe this may be cellulitis emanating from where the cast had rubbed on her from the previous week this week it appears to have done excellent. With that being said no sharp debridement was necessary at any point and the wound actually  appears to be doing quite well. Electronic Signature(s) Signed: 03/13/2020 4:54:28 PM By: Worthy Keeler PA-C Entered By: Worthy Keeler on 03/13/2020 16:54:27 -------------------------------------------------------------------------------- Physician Orders Details Patient Name: Date of Service: [place], [place] RBA [name] E. 03/13/2020 2:00 PM Medical Record Number: [id number] Patient Account Number: [id number] Date of Birth/Sex: Treating RN: 08/15/1945 (74 y.o. [name] Other Clinician: Referring Provider: Treating Provider/Extender: Adele Schilder in Treatment: 8 Verbal / Phone Orders: No Diagnosis Coding ICD-10 Coding Code Description E11.621 Type 2 diabetes mellitus with foot ulcer L97.522 Non-pressure chronic ulcer of other part of left foot with fat layer exposed M14.672 Charcot's joint, left ankle and foot E11.40 Type 2 diabetes mellitus with diabetic neuropathy, unspecified I10 Essential (primary) hypertension Follow-up Appointments ppointment in 1 week. - with Margarita Grizzle Return A Dressing Change Frequency Wound #1 Left,Medial,Plantar Foot Change Dressing every other day. Wound Cleansing Wound #1 Left,Medial,Plantar Foot May shower and wash wound with soap and water. Primary Wound Dressing Wound #1 Left,Medial,Plantar Foot Hydrofera Blue - classic moistened with saline cut to fit inside wound margins Secondary Dressing Wound #1 Left,Medial,Plantar Foot Foam - donut Kerlix/Rolled Gauze Dry Gauze Edema Control Avoid standing for long periods of time Elevate legs to the level of the heart or above for 30 minutes daily and/or when sitting, a frequency of: Off-Loading Open toe surgical shoe to: - left foot Electronic Signature(s) Signed: 03/13/2020 4:54:27 PM By: [name] RN, BSN Signed: 03/13/2020 5:10:43 PM By: Worthy Keeler PA-C Entered By: [name] on 03/13/2020  15:31:12 -------------------------------------------------------------------------------- Problem List Details Patient Name: Date of Service: [name] [name] E. 03/13/2020 2:00 PM Medical Record Number: [id number] Patient Account Number: [id number] Date of Birth/Sex: Treating RN: March 27, 1946 (75 y.o. [name] Other Clinician: Referring Provider: Treating Provider/Extender: Adele Schilder in Treatment: 8 Active Problems ICD-10 Encounter Code Description Active Date MDM Diagnosis E11.621 Type 2 diabetes mellitus with foot ulcer 01/17/2020 No Yes L97.522 Non-pressure chronic ulcer of other part of left foot with fat layer exposed 01/17/2020 No Yes M14.672 Charcot's joint, left ankle and foot 01/17/2020 No Yes E11.40 Type 2 diabetes mellitus with diabetic neuropathy, unspecified 01/17/2020 No Yes I10 Essential (primary) hypertension 01/17/2020 No Yes Inactive Problems Resolved Problems Electronic Signature(s) Signed: 03/13/2020 2:43:04 PM By: Worthy Keeler PA-C Entered By: Worthy Keeler on 03/13/2020 14:43:03 -------------------------------------------------------------------------------- Progress Note Details Patient Name: Date of Service: [name] [name], [name] [name] E. 03/13/2020 2:00 PM Medical Record Number: [id number] Patient Account Number: [id number] Date of Birth/Sex: Treating RN: [birth year] (74 y.o. [name] Other Clinician: Referring Provider: Treating Provider/Extender: Adele Schilder in Treatment: 8 Subjective Chief Complaint Information obtained from Patient Left foot ulcer History of Present Illness (HPI) 01/17/2020 upon evaluation today patient presents for initial evaluation here in our clinic concerning issues she has been  having with a left medial/plantar foot ulcer. This is actually been an issue for her since October 2020. She has been seeing Dr. Doran Durand  for quite some time during that course. Fortunately there is no signs of active infection at this time. Or least no mention of this to have seen in general. With that being said unfortunately I do see some signs of erythema noted today that does have me concerned about the possibility of infection at this point in the surrounding area of the wound. There is also a warm to touch at the site which is somewhat concerning. Fortunately there is no evidence of systemic infection which is great news. The patient does have a history of diabetes mellitus type 2, Charcot foot which is what led to the wound, and hypertension. She notes that she was in a cast for some time with Dr. Doran Durand for about 8 weeks. During that time they were utilizing according to the patient silver nitrate along with a foam doughnut and then Coban to secure in place in the cast in place. With that being said I do not have the actual records to review we are going to try to get a hold of those unfortunately they would not flow over into care everywhere I did look today. She has been seeing Dr. Doran Durand and his physician assistant Larkin Ina up until the end of May and apparently is still seeing them on a regular basis every 2 weeks roughly. She has also tried Iodosorb without effect here. 01/24/2020 upon evaluation today patient actually appears to be doing quite well with regard to her wounds. She has been tolerating the dressing changes without complication. Fortunately there is no signs of active infection spreading which is good news. Her culture did show signs of Staph aureus I did place her on Augmentin due to the erythema surrounding the wound. With that being said the wound does appear to be doing better she has her longer walking cast/boot and I think that is actually good for her for the time being. I am considering reinitiating total contact cast when she gets back from vacation but next week she will actually be out of town at [employer] she knows not to get in the water but she still obviously is planning to enjoy herself she is going to take it easy on her foot however. 02/07/2020 upon evaluation today patient appears to be doing fairly well in regard to her ulcer on her foot. Fortunately there is no signs of severe infection at this time which is great news and overall very pleased in that regard. With that being said I do think that she could still benefit from a total contact cast. Nonetheless she is using her walking boot which at least provide some protection and that it prevents some of the friction occurring when she is ambulating. 02/14/2020 upon evaluation today patient appears to be doing well with regard to her foot ulcer. This is actually measuring a little bit smaller yet again this week. Overall very pleased with where things stand and I do not see any signs of active infection at this time which is also good news. Since she is measuring better the patient has wanting to somewhat hold off on proceeding with the total contact cast which I think is reasonable at this point. 02/28/2020 on evaluation today patient appears to be doing well in general in regard to her wound although she has a lot of callus buildup as compared to last time  I saw her. This is can require sharp debridement today. I do believe she really needs the total contact cast as well which we have discussed previous. 7/23; patient comes in for a total contact cast change 03/06/2020 on evaluation today patient appears to be doing quite well with regard to her wounds. Fortunately the wound bed is measuring smaller and looking much better there is little callus noted although there is some debridement necessary today. 03/13/2020 on evaluation today patient's wound actually appears to be doing excellent which is great news. With that being said unfortunately she is having some issues currently with her left leg where she does have cellulitis it appears. This  may have come from an area that rubbed underneath the cast from last week that we noted we padded that area and it looks to be doing excellent at this point but nonetheless the leg was somewhat painful, swollen, and somewhat erythematous. She also had an elevated white blood cell count of 11.5 based on what I saw on looking at her records from the med center in [facility] from where she was seen yesterday. Unfortunately with [place] having a provider on vacation there was no one here in the clinic in the afternoon when she called therefore she went to the ER as advised. Subsequently they did not cut off the cast as they did not have anyone from orthopedics there to do so and subsequently also did not have the ability to do the Doppler for evaluation of DVT They recommended therefore given her dose of Eliquis as well as . Augmentin and sent her home to come see [place] today to have the cast taken off and then she is supposed to go back to have the study for DVT performed they are following. Objective Constitutional Obese and well-hydrated in no acute distress. Vitals Time Taken: 2:17 PM, Height: 66 in, Weight: 245 lbs, BMI: 39.5, Temperature: 98.5 F, Pulse: 90 bpm, Respiratory Rate: 18 breaths/min, Blood Pressure: 149/83 mmHg. Respiratory normal breathing without difficulty. Psychiatric this patient is able to make decisions and demonstrates good insight into disease process. Alert and Oriented x 3. pleasant and cooperative. General Notes: Upon inspection here today the patient's wound actually appears to be doing excellent there is no signs of infection around the wound and the cellulitis all appears to be in the leg from the ankle up to the knee and not involving the foot at all which is good news for the wound but unfortunate that she is having a completely separate issue. I believe this may be cellulitis emanating from where the cast had rubbed on her from the previous week this week it appears to  have done excellent. With that being said no sharp debridement was necessary at any point and the wound actually appears to be doing quite well. Integumentary (Hair, Skin) Wound #1 status is Open. Original cause of wound was Gradually Appeared. The wound is located on the [place]. The wound measures 0.7cm length x 0.8cm width x 0.3cm depth; 0.44cm^2 area and 0.132cm^3 volume. There is Fat Layer (Subcutaneous Tissue) Exposed exposed. There is no tunneling or undermining noted. There is a small amount of serosanguineous drainage noted. The wound margin is well defined and not attached to the wound base. There is large (67-100%) red, pink granulation within the wound bed. There is no necrotic tissue within the wound bed. Assessment Active Problems ICD-10 Type 2 diabetes mellitus with foot ulcer Non-pressure chronic ulcer of other part of left foot with  fat layer exposed Charcot's joint, left ankle and foot Type 2 diabetes mellitus with diabetic neuropathy, unspecified Essential (primary) hypertension Plan Follow-up Appointments: Return Appointment in 1 week. - with Margarita Grizzle Dressing Change Frequency: Wound #1 Left,Medial,Plantar Foot: Change Dressing every other day. Wound Cleansing: Wound #1 Left,Medial,Plantar Foot: May shower and wash wound with soap and water. Primary Wound Dressing: Wound #1 Left,Medial,Plantar Foot: Hydrofera Blue - classic moistened with saline cut to fit inside wound margins Secondary Dressing: Wound #1 Left,Medial,Plantar Foot: Foam - donut Kerlix/Rolled Gauze Dry Gauze Edema Control: Avoid standing for long periods of time Elevate legs to the level of the heart or above for 30 minutes daily and/or when sitting, a frequency of: Off-Loading: Open toe surgical shoe to: - left foot 1. I would recommend currently that we give her a break from the cast for this week that would have been the plan anyway she was going to conference next week though  she has canceled that unfortunately. I felt really bad about this as that was something she was very much looking forward to. 2. I am going to recommend that we go ahead and continue with the [facility] Blue dressing in the interim that seems to have done well for her foot ulcer. 3. I am also going to suggest she continue taking the Augmentin she is going to call me and let me know how much of that she was given and how much more she may need as far as getting at least a 10 to 14-day course in her and in order to ensure that nothing worsens. 4. In regard to the total contact cast if she is doing better by next week we will plan to get the cast back on her at that time. We will see patient back for reevaluation in 1 week here in the clinic. If anything worsens or changes patient will contact our office for additional recommendations. Electronic Signature(s) Signed: 03/13/2020 4:55:32 PM By: Worthy Keeler PA-C Entered By: Worthy Keeler on 03/13/2020 16:55:32 -------------------------------------------------------------------------------- SuperBill Details Patient Name: Date of Service: [name] [name], [name] [name] E. 03/13/2020 Medical Record Number: [id number] Patient Account Number: [id number] Date of Birth/Sex: Treating RN: 03/02/1946 (74 y.o. [name] Other Clinician: Referring Provider: Treating Provider/Extender: Adele Schilder in Treatment: 8 Diagnosis Coding ICD-10 Codes Code Description E11.621 Type 2 diabetes mellitus with foot ulcer L97.522 Non-pressure chronic ulcer of other part of left foot with fat layer exposed M14.672 Charcot's joint, left ankle and foot E11.40 Type 2 diabetes mellitus with diabetic neuropathy, unspecified I10 Essential (primary) hypertension Facility Procedures CPT4 Code: [id number] Description: 99213 - WOUND CARE VISIT-LEV 3 EST PT Modifier: Quantity: 1 Physician Procedures : CPT4 Code Description  Modifier [id number] - WC PHYS LEVEL 3 - EST PT ICD-10 Diagnosis Description E11.621 Type 2 diabetes mellitus with foot ulcer L97.522 Non-pressure chronic ulcer of other part of left foot with fat layer exposed M14.672 Charcot's  joint, left ankle and foot E11.40 Type 2 diabetes mellitus with diabetic neuropathy, unspecified Quantity: 1 Electronic Signature(s) Signed: 03/13/2020 4:55:58 PM By: Worthy Keeler PA-C Previous Signature: 03/13/2020 4:54:27 PM Version By: [name] RN, BSN Entered By: Worthy Keeler on 03/13/2020 16:55:57

## 2020-03-13 NOTE — Progress Notes (Signed)
ELFRIEDA, ESPINO ([id number]) Visit Report for 03/13/2020 Arrival Information Details Patient Name: Date of Service: NAJEE, COWENS RA E. 03/13/2020 2:00 PM Medical Record Number: [id number] Patient Account Number: [id number] Date of Birth/Sex: Treating RN: 07-Jan-1946 (74 y.o. [name] Primary Care Tarus Briski: [name] Other Clinician: Referring Abdulahi Schor: Treating Braden Cimo/Extender: Adele Schilder in Treatment: 8 Visit Information History Since Last Visit Added or deleted any medications: No Patient Arrived: Wheel Chair Any new allergies or adverse reactions: No Arrival Time: 14:12 Had a fall or experienced change in No Accompanied By: self activities of daily living that may affect Transfer Assistance: None risk of falls: Patient Identification Verified: Yes Signs or symptoms of abuse/neglect since last visito No Secondary Verification Process Completed: Yes Hospitalized since last visit: No Patient Requires Transmission-Based Precautions: No Implantable device outside of the clinic excluding No Patient Has Alerts: No cellular tissue based products placed in the center since last visit: Has Dressing in Place as Prescribed: Yes Has Footwear/Offloading in Place as Prescribed: Yes Left: T Contact Cast otal Pain Present Now: Yes Electronic Signature(s) Signed: 03/13/2020 4:37:37 PM By: Kela Millin Entered By: Kela Millin on 03/13/2020 14:30:10 -------------------------------------------------------------------------------- Clinic Level of Care Assessment Details Patient Name: Date of Service: BREELY, PANIK RA E. 03/13/2020 2:00 PM Medical Record Number: [id number] Patient Account Number: [id number] Date of Birth/Sex: Treating RN: 04-29-1946 (74 y.o. [name] Primary Care Kristin Lamagna: [name] Other Clinician: Referring Jaekwon Mcclune: Treating Arno Cullers/Extender: Adele Schilder in Treatment: 8 Clinic Level of Care  Assessment Items TOOL 4 Quantity Score '[]'$  - 0 Use when only an EandM is performed on FOLLOW-UP visit ASSESSMENTS - Nursing Assessment / Reassessment X- 1 10 Reassessment of Co-morbidities (includes updates in patient status) X- 1 5 Reassessment of Adherence to Treatment Plan ASSESSMENTS - Wound and Skin A ssessment / Reassessment X - Simple Wound Assessment / Reassessment - one wound 1 5 '[]'$  - 0 Complex Wound Assessment / Reassessment - multiple wounds '[]'$  - 0 Dermatologic / Skin Assessment (not related to wound area) ASSESSMENTS - Focused Assessment X- 1 5 Circumferential Edema Measurements - multi extremities '[]'$  - 0 Nutritional Assessment / Counseling / Intervention X- 1 5 Lower Extremity Assessment (monofilament, tuning fork, pulses) '[]'$  - 0 Peripheral Arterial Disease Assessment (using hand held doppler) ASSESSMENTS - Ostomy and/or Continence Assessment and Care '[]'$  - 0 Incontinence Assessment and Management '[]'$  - 0 Ostomy Care Assessment and Management (repouching, etc.) PROCESS - Coordination of Care X - Simple Patient / Family Education for ongoing care 1 15 '[]'$  - 0 Complex (extensive) Patient / Family Education for ongoing care X- 1 10 Staff obtains Programmer, systems, Records, T Results / Process Orders est '[]'$  - 0 Staff telephones HHA, Nursing Homes / Clarify orders / etc '[]'$  - 0 Routine Transfer to another Facility (non-emergent condition) '[]'$  - 0 Routine Hospital Admission (non-emergent condition) '[]'$  - 0 New Admissions / Biomedical engineer / Ordering NPWT Apligraf, etc. , '[]'$  - 0 Emergency Hospital Admission (emergent condition) X- 1 10 Simple Discharge Coordination '[]'$  - 0 Complex (extensive) Discharge Coordination PROCESS - Special Needs '[]'$  - 0 Pediatric / Minor Patient Management '[]'$  - 0 Isolation Patient Management '[]'$  - 0 Hearing / Language / Visual special needs '[]'$  - 0 Assessment of Community assistance (transportation, D/C planning, etc.) '[]'$  -  0 Additional assistance / Altered mentation '[]'$  - 0 Support Surface(s) Assessment (bed, cushion, seat, etc.) INTERVENTIONS - Wound Cleansing / Measurement X - Simple Wound  Cleansing - one wound 1 5 '[]'$  - 0 Complex Wound Cleansing - multiple wounds X- 1 5 Wound Imaging (photographs - any number of wounds) '[]'$  - 0 Wound Tracing (instead of photographs) X- 1 5 Simple Wound Measurement - one wound '[]'$  - 0 Complex Wound Measurement - multiple wounds INTERVENTIONS - Wound Dressings X - Small Wound Dressing one or multiple wounds 1 10 '[]'$  - 0 Medium Wound Dressing one or multiple wounds '[]'$  - 0 Large Wound Dressing one or multiple wounds X- 1 5 Application of Medications - topical '[]'$  - 0 Application of Medications - injection INTERVENTIONS - Miscellaneous '[]'$  - 0 External ear exam '[]'$  - 0 Specimen Collection (cultures, biopsies, blood, body fluids, etc.) '[]'$  - 0 Specimen(s) / Culture(s) sent or taken to Lab for analysis '[]'$  - 0 Patient Transfer (multiple staff / Civil Service fast streamer / Similar devices) '[]'$  - 0 Simple Staple / Suture removal (25 or less) '[]'$  - 0 Complex Staple / Suture removal (26 or more) '[]'$  - 0 Hypo / Hyperglycemic Management (close monitor of Blood Glucose) '[]'$  - 0 Ankle / Brachial Index (ABI) - do not check if billed separately X- 1 5 Vital Signs Has the patient been seen at the hospital within the last three years: Yes Total Score: 100 Level Of Care: New/Established - Level 3 Electronic Signature(s) Signed: 03/13/2020 4:54:27 PM By: [name] RN, BSN Entered By: [name] on 03/13/2020 15:25:42 -------------------------------------------------------------------------------- Encounter Discharge Information Details Patient Name: Date of Service: [name] 03/13/2020 2:00 PM Medical Record Number: [id number] Patient Account Number: [id number] Date of Birth/Sex: Treating RN: 11-30-1945 (74 y.o. [name] Primary Care Tyra Gural: [name] Other  Clinician: Referring Evan Osburn: Treating Wynee Matarazzo/Extender: Adele Schilder in Treatment: 8 Encounter Discharge Information Items Discharge Condition: Stable Ambulatory Status: Wheelchair Discharge Destination: Home Transportation: Private Auto Accompanied By: self Schedule Follow-up Appointment: Yes Clinical Summary of Care: Patient Declined Electronic Signature(s) Signed: 03/13/2020 4:37:37 PM By: Kela Millin Entered By: Kela Millin on 03/13/2020 16:34:00 -------------------------------------------------------------------------------- Lower Extremity Assessment Details Patient Name: Date of Service: [name] 03/13/2020 2:00 PM Medical Record Number: [id number] Patient Account Number: [id number] Date of Birth/Sex: Treating RN: June 13, 1946 (74 y.o. [name] Primary Care Lovetta Condie: [name] Other Clinician: Referring Pier Bosher: Treating Hendrix Console/Extender: Landis Martins Weeks in Treatment: 8 Edema Assessment Assessed: [Left: No] [Right: No] Edema: [Left: Ye] [Right: s] Calf Left: Right: Point of Measurement: 35 cm From Medial Instep 42 cm cm Ankle Left: Right: Point of Measurement: 9 cm From Medial Instep 25.7 cm cm Vascular Assessment Pulses: Dorsalis Pedis Palpable: [Left:Yes] Electronic Signature(s) Signed: 03/13/2020 4:37:37 PM By: Kela Millin Entered By: Kela Millin on 03/13/2020 14:30:01 -------------------------------------------------------------------------------- Multi-Disciplinary Care Plan Details Patient Name: Date of Service: [name] 03/13/2020 2:00 PM Medical Record Number: [id number] Patient Account Number: [id number] Date of Birth/Sex: Treating RN: 1946/05/03 (74 y.o. [name] Primary Care Moritz Lever: [name] Other Clinician: Referring Truett Mcfarlan: Treating Ronne Stefanski/Extender: Adele Schilder in Treatment: 8 Active  Inactive Nutrition Nursing Diagnoses: Impaired glucose control: actual or potential Potential for alteratiion in Nutrition/Potential for imbalanced nutrition Goals: Patient/caregiver verbalizes understanding of need to maintain therapeutic glucose control per primary care physician Date Initiated: 01/17/2020 Date Inactivated: 02/14/2020 Target Resolution Date: 02/14/2020 Goal Status: Met Patient/caregiver will maintain therapeutic glucose control Date Initiated: 01/17/2020 Target Resolution Date: 04/10/2020 Goal Status: Active Interventions: Assess HgA1c results as ordered upon admission and as needed  Assess patient nutrition upon admission and as needed per policy Provide education on elevated blood sugars and impact on wound healing Treatment Activities: Patient referred to Primary Care Physician for further nutritional evaluation : 01/17/2020 Notes: Wound/Skin Impairment Nursing Diagnoses: Impaired tissue integrity Knowledge deficit related to ulceration/compromised skin integrity Goals: Patient/caregiver will verbalize understanding of skin care regimen Date Initiated: 01/17/2020 Target Resolution Date: 04/10/2020 Goal Status: Active Ulcer/skin breakdown will have a volume reduction of 30% by week 4 Date Initiated: 01/17/2020 Date Inactivated: 02/14/2020 Target Resolution Date: 02/14/2020 Goal Status: Met Ulcer/skin breakdown will have a volume reduction of 50% by week 8 Date Initiated: 02/14/2020 Date Inactivated: 03/13/2020 Target Resolution Date: 03/13/2020 Goal Status: Met Ulcer/skin breakdown will have a volume reduction of 80% by week 12 Date Initiated: 03/13/2020 Target Resolution Date: 04/10/2020 Goal Status: Active Interventions: Assess patient/caregiver ability to obtain necessary supplies Assess patient/caregiver ability to perform ulcer/skin care regimen upon admission and as needed Assess ulceration(s) every visit Provide education on ulcer and skin care Treatment Activities: Skin  care regimen initiated : 01/17/2020 Topical wound management initiated : 01/17/2020 Notes: Electronic Signature(s) Signed: 03/13/2020 4:54:27 PM By: [name] RN, BSN Entered By: [name] on 03/13/2020 15:21:57 -------------------------------------------------------------------------------- Pain Assessment Details Patient Name: Date of Service: [name] 03/13/2020 2:00 PM Medical Record Number: [id number] Patient Account Number: [id number] Date of Birth/Sex: Treating RN: 03/11/1946 (74 y.o. [name] Primary Care Kadeshia Kasparian: [name] Other Clinician: Referring Thomos Domine: Treating Flor Whitacre/Extender: Adele Schilder in Treatment: 8 Active Problems Location of Pain Severity and Description of Pain Patient Has Paino Yes Site Locations Pain Location: Generalized Pain, Pain in Ulcers With Dressing Change: Yes Duration of the Pain. Constant / Intermittento Constant Rate the pain. Current Pain Level: 4 Worst Pain Level: 8 Least Pain Level: 4 Tolerable Pain Level: 4 Character of Pain Describe the Pain: Aching, Burning Pain Management and Medication Current Pain Management: Electronic Signature(s) Signed: 03/13/2020 4:37:37 PM By: Kela Millin Entered By: Kela Millin on 03/13/2020 14:29:50 -------------------------------------------------------------------------------- Patient/Caregiver Education Details Patient Name: Date of Service: [name] 8/4/2021andnbsp2:00 PM Medical Record Number: [id number] Patient Account Number: [id number] Date of Birth/Gender: Treating RN: 1945/12/08 (75 y.o. [name] Primary Care Physician: [name] Other Clinician: Referring Physician: Treating Physician/Extender: Adele Schilder in Treatment: 8 Education Assessment Education Provided To: Patient Education Topics Provided Elevated Blood Sugar/ Impact on Healing: Methods:  Explain/Verbal Responses: Reinforcements needed, State content correctly Offloading: Methods: Explain/Verbal Responses: Reinforcements needed, State content correctly Wound/Skin Impairment: Methods: Explain/Verbal Responses: Reinforcements needed, State content correctly Electronic Signature(s) Signed: 03/13/2020 4:54:27 PM By: [name] RN, BSN Entered By: [name] on 03/13/2020 15:22:55 -------------------------------------------------------------------------------- Wound Assessment Details Patient Name: Date of Service: Lawlor, Reinbeck RBA RA E. 03/13/2020 2:00 PM Medical Record Number: [id number] Patient Account Number: [id number] Date of Birth/Sex: Treating RN: 1946-01-16 (74 y.o. [name] Primary Care Elica Almas: [name] Other Clinician: Referring Jameek Bruntz: Treating Korynne Dols/Extender: Landis Martins Weeks in Treatment: 8 Wound Status Wound Number: 1 Primary Diabetic Wound/Ulcer of the Lower Extremity Etiology: Wound Location: Left, Medial, Plantar Foot Wound Open Wounding Event: Gradually Appeared Status: Date Acquired: 05/11/2019 Comorbid Sleep Apnea, Deep Vein Thrombosis, Hypertension, Colitis, Type Weeks Of Treatment: 8 History: II Diabetes, Gout, Osteoarthritis, Neuropathy Clustered Wound: No Wound Measurements Length: (cm) 0.7 Width: (cm) 0.8 Depth: (cm) 0.3 Area: (cm) 0.44 Volume: (cm) 0.132 % Reduction in Area: 56.9% % Reduction in Volume: 67.6% Epithelialization: Small (  1-33%) Tunneling: No Undermining: No Wound Description Classification: Grade 2 Wound Margin: Well defined, not attached Exudate Amount: Small Exudate Type: Serosanguineous Exudate Color: red, brown Foul Odor After Cleansing: No Slough/Fibrino No Wound Bed Granulation Amount: Large (67-100%) Exposed Structure Granulation Quality: Red, Pink Fascia Exposed: No Necrotic Amount: None Present (0%) Fat Layer (Subcutaneous Tissue) Exposed: Yes Tendon  Exposed: No Muscle Exposed: No Joint Exposed: No Bone Exposed: No Treatment Notes Wound #1 (Left, Medial, Plantar Foot) 1. Cleanse With Wound Cleanser Soap and water 2. Periwound Care Skin Prep 3. Primary Dressing Applied Hydrofera Blue 4. Secondary Dressing Dry Gauze Roll Gauze Foam 5. Secured With Tape 7. Footwear/Offloading device applied Surgical shoe Electronic Signature(s) Signed: 03/13/2020 4:54:27 PM By: [name] RN, BSN Entered By: [name] on 03/13/2020 15:24:55 -------------------------------------------------------------------------------- Vitals Details Patient Name: Date of Service: Cipriani, [place] RBA RA E. 03/13/2020 2:00 PM Medical Record Number: [id number] Patient Account Number: [id number] Date of Birth/Sex: Treating RN: 12-23-1945 (74 y.o. [name] Primary Care Amonte Brookover: [name] Other Clinician: Referring Lanyiah Brix: Treating Alondria Mousseau/Extender: Landis Martins Weeks in Treatment: 8 Vital Signs Time Taken: 14:17 Temperature (F): 98.5 Height (in): 66 Pulse (bpm): 90 Weight (lbs): 245 Respiratory Rate (breaths/min): 18 Body Mass Index (BMI): 39.5 Blood Pressure (mmHg): 149/83 Reference Range: 80 - 120 mg / dl Electronic Signature(s) Signed: 03/13/2020 4:37:37 PM By: Kela Millin Entered By: Kela Millin on 03/13/2020 14:17:41

## 2020-03-14 ENCOUNTER — Encounter: Payer: Self-pay | Admitting: Internal Medicine

## 2020-03-14 ENCOUNTER — Ambulatory Visit (HOSPITAL_BASED_OUTPATIENT_CLINIC_OR_DEPARTMENT_OTHER): Payer: Medicare Other

## 2020-03-15 NOTE — Telephone Encounter (Signed)
F/u    Please call back on  [phone]   MD suggested patient to take a different antibiotic verse the current amoxicillin-clavulanate (AUGMENTIN) 875-125 MG tablet that she is on now.    Asking for a call back

## 2020-03-15 NOTE — Telephone Encounter (Signed)
Pt contacted and pt is requesting a different abx than the Augmentin due to severe diarrhea.   Please advise.

## 2020-03-16 MED ORDER — DOXYCYCLINE HYCLATE 100 MG PO TABS: 100.0000 mg | ORAL_TABLET | Freq: Two times a day (BID) | ORAL | 0 refills | Status: DC

## 2020-03-17 ENCOUNTER — Encounter: Payer: Self-pay | Admitting: Internal Medicine

## 2020-03-20 ENCOUNTER — Encounter (HOSPITAL_BASED_OUTPATIENT_CLINIC_OR_DEPARTMENT_OTHER): Payer: Medicare Other | Admitting: Physician Assistant

## 2020-03-20 DIAGNOSIS — E11621 Type 2 diabetes mellitus with foot ulcer: Secondary | ICD-10-CM | POA: Diagnosis not present

## 2020-03-20 DIAGNOSIS — L97522 Non-pressure chronic ulcer of other part of left foot with fat layer exposed: Secondary | ICD-10-CM | POA: Diagnosis not present

## 2020-03-20 DIAGNOSIS — I10 Essential (primary) hypertension: Secondary | ICD-10-CM | POA: Diagnosis not present

## 2020-03-20 DIAGNOSIS — E669 Obesity, unspecified: Secondary | ICD-10-CM | POA: Diagnosis not present

## 2020-03-20 DIAGNOSIS — E1140 Type 2 diabetes mellitus with diabetic neuropathy, unspecified: Secondary | ICD-10-CM | POA: Diagnosis not present

## 2020-03-20 DIAGNOSIS — E1151 Type 2 diabetes mellitus with diabetic peripheral angiopathy without gangrene: Secondary | ICD-10-CM | POA: Diagnosis not present

## 2020-03-20 NOTE — Progress Notes (Addendum)
PHALLON, HAYDU ([id number]) Visit Report for 03/20/2020 Chief Complaint Document Details Patient Name: Date of Service: [name] [name], [name] E. 03/20/2020 1:30 PM Medical Record Number: [id number] Patient Account Number: [id number] Date of Birth/Sex: Treating RN: [birth year] (74 y.o. [name] Other Clinician: Referring Provider: Treating Provider/Extender: Adele Schilder in Treatment: 9 Information Obtained from: Patient Chief Complaint Left foot ulcer Electronic Signature(s) Signed: 03/20/2020 1:56:52 PM By: Worthy Keeler PA-C Entered By: Worthy Keeler on 03/20/2020 13:56:52 -------------------------------------------------------------------------------- Debridement Details Patient Name: Date of Service: [name] Amato RA E. 03/20/2020 1:30 PM Medical Record Number: [id number] Patient Account Number: [id number] Date of Birth/Sex: Treating RN: 1945/12/30 (74 y.o. [name] Other Clinician: Referring Provider: Treating Provider/Extender: Adele Schilder in Treatment: 9 Debridement Performed for Assessment: Wound #1 Left,Medial,Plantar Foot Performed By: Physician Worthy Keeler, PA Debridement Type: Debridement Severity of Tissue Pre Debridement: Fat layer exposed Level of Consciousness (Pre-procedure): Awake and Alert Pre-procedure Verification/Time Out Yes - 15:03 Taken: Start Time: 15:04 Pain Control: Other : benzocaine 20% spray T Area Debrided (L x W): otal 1.5 (cm) x 1.5 (cm) = 2.25 (cm) Tissue and other material debrided: Viable, Non-Viable, Callus, Slough, Subcutaneous, Skin: Epidermis, Slough Level: Skin/Subcutaneous Tissue Debridement Description: Excisional Instrument: Curette Bleeding: Minimum Hemostasis Achieved: Pressure End Time: 15:10 Procedural Pain: 0 Post Procedural Pain: 0 Response to Treatment: Procedure was tolerated  well Level of Consciousness (Post- Awake and Alert procedure): Post Debridement Measurements of Total Wound Length: (cm) 0.9 Width: (cm) 1.1 Depth: (cm) 0.3 Volume: (cm) 0.233 Character of Wound/Ulcer Post Debridement: Improved Severity of Tissue Post Debridement: Fat layer exposed Post Procedure Diagnosis Same as Pre-procedure Electronic Signature(s) Signed: 03/20/2020 6:14:05 PM By: [name] RN, BSN Signed: 03/21/2020 10:06:45 AM By: Worthy Keeler PA-C Entered By: [name] on 03/20/2020 15:08:19 -------------------------------------------------------------------------------- HPI Details Patient Name: Date of Service: [name] [name], [name] RA E. 03/20/2020 1:30 PM Medical Record Number: [id number] Patient Account Number: [id number] Date of Birth/Sex: Treating RN: 05/27/1946 (74 y.o. [name] Other Clinician: Referring Provider: Treating Provider/Extender: Adele Schilder in Treatment: 9 History of Present Illness HPI Description: 01/17/2020 upon evaluation today patient presents for initial evaluation here in our clinic concerning issues she has been having with a left medial/plantar foot ulcer. This is actually been an issue for her since October 2020. She has been seeing Dr. Doran Durand for quite some time during that course. Fortunately there is no signs of active infection at this time. Or least no mention of this to have seen in general. With that being said unfortunately I do see some signs of erythema noted today that does have me concerned about the possibility of infection at this point in the surrounding area of the wound. There is also a warm to touch at the site which is somewhat concerning. Fortunately there is no evidence of systemic infection which is great news. The patient does have a history of diabetes mellitus type 2, Charcot foot which is what led to the wound, and hypertension. She notes that she was  in a cast for some time with Dr. Doran Durand for about 8 weeks. During that time they were utilizing according to the patient silver nitrate along with a foam doughnut and then Coban to secure in place in the cast in place. With that being said I do not have the  actual records to review we are going to try to get a hold of those unfortunately they would not flow over into care everywhere I did look today. She has been seeing Dr. Doran Durand and his physician assistant Larkin Ina up until the end of May and apparently is still seeing them on a regular basis every 2 weeks roughly. She has also tried Iodosorb without effect here. 01/24/2020 upon evaluation today patient actually appears to be doing quite well with regard to her wounds. She has been tolerating the dressing changes without complication. Fortunately there is no signs of active infection spreading which is good news. Her culture did show signs of Staph aureus I did place her on Augmentin due to the erythema surrounding the wound. With that being said the wound does appear to be doing better she has her longer walking cast/boot and I think that is actually good for her for the time being. I am considering reinitiating total contact cast when she gets back from vacation but next week she will actually be out of town at [employer] she knows not to get in the water but she still obviously is planning to enjoy herself she is going to take it easy on her foot however. 02/07/2020 upon evaluation today patient appears to be doing fairly well in regard to her ulcer on her foot. Fortunately there is no signs of severe infection at this time which is great news and overall very pleased in that regard. With that being said I do think that she could still benefit from a total contact cast. Nonetheless she is using her walking boot which at least provide some protection and that it prevents some of the friction occurring when she is ambulating. 02/14/2020 upon evaluation  today patient appears to be doing well with regard to her foot ulcer. This is actually measuring a little bit smaller yet again this week. Overall very pleased with where things stand and I do not see any signs of active infection at this time which is also good news. Since she is measuring better the patient has wanting to somewhat hold off on proceeding with the total contact cast which I think is reasonable at this point. 02/28/2020 on evaluation today patient appears to be doing well in general in regard to her wound although she has a lot of callus buildup as compared to last time I saw her. This is can require sharp debridement today. I do believe she really needs the total contact cast as well which we have discussed previous. 7/23; patient comes in for a total contact cast change 03/06/2020 on evaluation today patient appears to be doing quite well with regard to her wounds. Fortunately the wound bed is measuring smaller and looking much better there is little callus noted although there is some debridement necessary today. 03/13/2020 on evaluation today patient's wound actually appears to be doing excellent which is great news. With that being said unfortunately she is having some issues currently with her left leg where she does have cellulitis it appears. This may have come from an area that rubbed underneath the cast from last week that we noted we padded that area and it looks to be doing excellent at this point but nonetheless the leg was somewhat painful, swollen, and somewhat erythematous. She also had an elevated white blood cell count of 11.5 based on what I saw on looking at her records from the med center in [facility] from where she was seen yesterday. Unfortunately  with [place] having a provider on vacation there was no one here in the clinic in the afternoon when she called therefore she went to the ER as advised. Subsequently they did not cut off the cast as they did not have anyone from  orthopedics there to do so and subsequently also did not have the ability to do the Doppler for evaluation of DVT They recommended therefore given her dose of Eliquis as well as . Augmentin and sent her home to come see [place] today to have the cast taken off and then she is supposed to go back to have the study for DVT performed they are following. 03/20/2020 upon evaluation today patient appears to be doing well with regard to her foot all things considered we have not been able to use the total contact cast due to the infection that she had last week. She has been on the doxycycline and she had a 10-day supply of that I do believe that is helping and her leg appears to be doing better. With that being said there is fortunately no signs of active infection systemically at this time which is good news. No fevers, chills, nausea, vomiting, or diarrhea. Electronic Signature(s) Signed: 03/20/2020 3:12:01 PM By: Worthy Keeler PA-C Entered By: Worthy Keeler on 03/20/2020 15:12:01 -------------------------------------------------------------------------------- Physical Exam Details Patient Name: Date of Service: [name] [name], [name] RA E. 03/20/2020 1:30 PM Medical Record Number: [id number] Patient Account Number: [id number] Date of Birth/Sex: Treating RN: 1945/10/08 (74 y.o. [name] Other Clinician: Referring Provider: Treating Provider/Extender: Landis Martins Weeks in Treatment: 9 Constitutional Well-nourished and well-hydrated in no acute distress. Respiratory normal breathing without difficulty. Psychiatric this patient is able to make decisions and demonstrates good insight into disease process. Alert and Oriented x 3. pleasant and cooperative. Notes Upon inspection patient's wound bed actually showed signs of some callus buildup around the edges of the wound which is to be expected all things considered. With that being said I do  believe she was doing much better with the cast but obviously I would have the cast on while there is any signs of inflammation. Electronic Signature(s) Signed: 03/20/2020 3:13:11 PM By: Worthy Keeler PA-C Entered By: Worthy Keeler on 03/20/2020 15:13:10 -------------------------------------------------------------------------------- Physician Orders Details Patient Name: Date of Service: [name] [name], [name] RA E. 03/20/2020 1:30 PM Medical Record Number: [id number] Patient Account Number: [id number] Date of Birth/Sex: Treating RN: October 13, 1945 (74 y.o. [name] Other Clinician: Referring Provider: Treating Provider/Extender: Adele Schilder in Treatment: 9 Verbal / Phone Orders: No Diagnosis Coding ICD-10 Coding Code Description E11.621 Type 2 diabetes mellitus with foot ulcer L97.522 Non-pressure chronic ulcer of other part of left foot with fat layer exposed M14.672 Charcot's joint, left ankle and foot E11.40 Type 2 diabetes mellitus with diabetic neuropathy, unspecified I10 Essential (primary) hypertension Follow-up Appointments ppointment in 1 week. - with Margarita Grizzle Return A Dressing Change Frequency Wound #1 Left,Medial,Plantar Foot Change Dressing every other day. Skin Barriers/Peri-Wound Care Moisturizing lotion - to foot with dressing changes TCA Cream or Ointment - triamcinolone cream to left leg rash daily Wound Cleansing Wound #1 Left,Medial,Plantar Foot May shower and wash wound with soap and water. Primary Wound Dressing Wound #1 Left,Medial,Plantar Foot Hydrofera Blue - classic moistened with saline cut to fit inside wound margins Secondary Dressing Wound #1 Left,Medial,Plantar Foot Foam - donut Kerlix/Rolled Gauze Dry Gauze Edema Control Avoid standing  for long periods of time Elevate legs to the level of the heart or above for 30 minutes daily and/or when sitting, a frequency of: Off-Loading Open toe  surgical shoe to: - left foot Patient Medications llergies: bee venom protein (honey bee), Sulfa (Sulfonamide Antibiotics), Farxiga A Notifications Medication Indication Start End 03/20/2020 doxycycline hyclate DOSE 1 - oral 100 mg capsule - 1 capsule oral taken 2 times per day for 5 days to add on to the current regimen 03/20/2020 triamcinolone acetonide DOSE topical 0.1 % cream - cream topical applied to the left leg until the redness resolves. Should be applied 1-2 times per days Electronic Signature(s) Signed: 03/20/2020 3:15:27 PM By: Worthy Keeler PA-C Entered By: Worthy Keeler on 03/20/2020 15:15:26 -------------------------------------------------------------------------------- Problem List Details Patient Name: Date of Service: [name] [name], [name] RA E. 03/20/2020 1:30 PM Medical Record Number: [id number] Patient Account Number: [id number] Date of Birth/Sex: Treating RN: 01/29/1946 (74 y.o. [name] Other Clinician: Referring Provider: Treating Provider/Extender: Adele Schilder in Treatment: 9 Active Problems ICD-10 Encounter Code Description Active Date MDM Diagnosis E11.621 Type 2 diabetes mellitus with foot ulcer 01/17/2020 No Yes L97.522 Non-pressure chronic ulcer of other part of left foot with fat layer exposed 01/17/2020 No Yes M14.672 Charcot's joint, left ankle and foot 01/17/2020 No Yes E11.40 Type 2 diabetes mellitus with diabetic neuropathy, unspecified 01/17/2020 No Yes I10 Essential (primary) hypertension 01/17/2020 No Yes Inactive Problems Resolved Problems Electronic Signature(s) Signed: 03/20/2020 1:56:45 PM By: Worthy Keeler PA-C Entered By: Worthy Keeler on 03/20/2020 13:56:44 -------------------------------------------------------------------------------- Progress Note Details Patient Name: Date of Service: [name] [name], [name] RA E. 03/20/2020 1:30 PM Medical Record Number: [id number] Patient  Account Number: [id number] Date of Birth/Sex: Treating RN: [birth year] (74 y.o. [name] Other Clinician: Referring Provider: Treating Provider/Extender: Adele Schilder in Treatment: 9 Subjective Chief Complaint Information obtained from Patient Left foot ulcer History of Present Illness (HPI) 01/17/2020 upon evaluation today patient presents for initial evaluation here in our clinic concerning issues she has been having with a left medial/plantar foot ulcer. This is actually been an issue for her since October 2020. She has been seeing Dr. Doran Durand for quite some time during that course. Fortunately there is no signs of active infection at this time. Or least no mention of this to have seen in general. With that being said unfortunately I do see some signs of erythema noted today that does have me concerned about the possibility of infection at this point in the surrounding area of the wound. There is also a warm to touch at the site which is somewhat concerning. Fortunately there is no evidence of systemic infection which is great news. The patient does have a history of diabetes mellitus type 2, Charcot foot which is what led to the wound, and hypertension. She notes that she was in a cast for some time with Dr. Doran Durand for about 8 weeks. During that time they were utilizing according to the patient silver nitrate along with a foam doughnut and then Coban to secure in place in the cast in place. With that being said I do not have the actual records to review we are going to try to get a hold of those unfortunately they would not flow over into care everywhere I did look today. She has been seeing Dr. Doran Durand and his physician assistant Larkin Ina up until the end of May  and apparently is still seeing them on a regular basis every 2 weeks roughly. She has also tried Iodosorb without effect here. 01/24/2020 upon evaluation today patient  actually appears to be doing quite well with regard to her wounds. She has been tolerating the dressing changes without complication. Fortunately there is no signs of active infection spreading which is good news. Her culture did show signs of Staph aureus I did place her on Augmentin due to the erythema surrounding the wound. With that being said the wound does appear to be doing better she has her longer walking cast/boot and I think that is actually good for her for the time being. I am considering reinitiating total contact cast when she gets back from vacation but next week she will actually be out of town at [employer] she knows not to get in the water but she still obviously is planning to enjoy herself she is going to take it easy on her foot however. 02/07/2020 upon evaluation today patient appears to be doing fairly well in regard to her ulcer on her foot. Fortunately there is no signs of severe infection at this time which is great news and overall very pleased in that regard. With that being said I do think that she could still benefit from a total contact cast. Nonetheless she is using her walking boot which at least provide some protection and that it prevents some of the friction occurring when she is ambulating. 02/14/2020 upon evaluation today patient appears to be doing well with regard to her foot ulcer. This is actually measuring a little bit smaller yet again this week. Overall very pleased with where things stand and I do not see any signs of active infection at this time which is also good news. Since she is measuring better the patient has wanting to somewhat hold off on proceeding with the total contact cast which I think is reasonable at this point. 02/28/2020 on evaluation today patient appears to be doing well in general in regard to her wound although she has a lot of callus buildup as compared to last time I saw her. This is can require sharp debridement today. I do believe she  really needs the total contact cast as well which we have discussed previous. 7/23; patient comes in for a total contact cast change 03/06/2020 on evaluation today patient appears to be doing quite well with regard to her wounds. Fortunately the wound bed is measuring smaller and looking much better there is little callus noted although there is some debridement necessary today. 03/13/2020 on evaluation today patient's wound actually appears to be doing excellent which is great news. With that being said unfortunately she is having some issues currently with her left leg where she does have cellulitis it appears. This may have come from an area that rubbed underneath the cast from last week that we noted we padded that area and it looks to be doing excellent at this point but nonetheless the leg was somewhat painful, swollen, and somewhat erythematous. She also had an elevated white blood cell count of 11.5 based on what I saw on looking at her records from the med center in [facility] from where she was seen yesterday. Unfortunately with [place] having a provider on vacation there was no one here in the clinic in the afternoon when she called therefore she went to the ER as advised. Subsequently they did not cut off the cast as they did not have anyone from  orthopedics there to do so and subsequently also did not have the ability to do the Doppler for evaluation of DVT They recommended therefore given her dose of Eliquis as well as . Augmentin and sent her home to come see [place] today to have the cast taken off and then she is supposed to go back to have the study for DVT performed they are following. 03/20/2020 upon evaluation today patient appears to be doing well with regard to her foot all things considered we have not been able to use the total contact cast due to the infection that she had last week. She has been on the doxycycline and she had a 10-day supply of that I do believe that is helping and her  leg appears to be doing better. With that being said there is fortunately no signs of active infection systemically at this time which is good news. No fevers, chills, nausea, vomiting, or diarrhea. Objective Constitutional Well-nourished and well-hydrated in no acute distress. Vitals Time Taken: 2:09 PM, Height: 66 in, Weight: 245 lbs, BMI: 39.5, Temperature: 98.0 F, Pulse: 100 bpm, Respiratory Rate: 18 breaths/min, Blood Pressure: 154/82 mmHg. Respiratory normal breathing without difficulty. Psychiatric this patient is able to make decisions and demonstrates good insight into disease process. Alert and Oriented x 3. pleasant and cooperative. General Notes: Upon inspection patient's wound bed actually showed signs of some callus buildup around the edges of the wound which is to be expected all things considered. With that being said I do believe she was doing much better with the cast but obviously I would have the cast on while there is any signs of inflammation. Integumentary (Hair, Skin) Wound #1 status is Open. Original cause of wound was Gradually Appeared. The wound is located on the [place]. The wound measures 0.9cm length x 1.1cm width x 0.3cm depth; 0.778cm^2 area and 0.233cm^3 volume. There is Fat Layer (Subcutaneous Tissue) Exposed exposed. There is no tunneling or undermining noted. There is a small amount of serosanguineous drainage noted. The wound margin is well defined and not attached to the wound base. There is large (67-100%) red, pink granulation within the wound bed. There is a small (1-33%) amount of necrotic tissue within the wound bed including Adherent Slough. Assessment Active Problems ICD-10 Type 2 diabetes mellitus with foot ulcer Non-pressure chronic ulcer of other part of left foot with fat layer exposed Charcot's joint, left ankle and foot Type 2 diabetes mellitus with diabetic neuropathy, unspecified Essential (primary)  hypertension Procedures Wound #1 Pre-procedure diagnosis of Wound #1 is a Diabetic Wound/Ulcer of the Lower Extremity located on the Left,Medial,Plantar Foot .Severity of Tissue Pre Debridement is: Fat layer exposed. There was a Excisional Skin/Subcutaneous Tissue Debridement with a total area of 2.25 sq cm performed by Worthy Keeler, PA. With the following instrument(s): Curette to remove Viable and Non-Viable tissue/material. Material removed includes Callus, Subcutaneous Tissue, Slough, and Skin: Epidermis after achieving pain control using Other (benzocaine 20% spray). No specimens were taken. A time out was conducted at 15:03, prior to the start of the procedure. A Minimum amount of bleeding was controlled with Pressure. The procedure was tolerated well with a pain level of 0 throughout and a pain level of 0 following the procedure. Post Debridement Measurements: 0.9cm length x 1.1cm width x 0.3cm depth; 0.233cm^3 volume. Character of Wound/Ulcer Post Debridement is improved. Severity of Tissue Post Debridement is: Fat layer exposed. Post procedure Diagnosis Wound #1: Same as Pre-Procedure Plan Follow-up Appointments: Return Appointment in  1 week. - with Margarita Grizzle Dressing Change Frequency: Wound #1 Left,Medial,Plantar Foot: Change Dressing every other day. Skin Barriers/Peri-Wound Care: Moisturizing lotion - to foot with dressing changes TCA Cream or Ointment - triamcinolone cream to left leg rash daily Wound Cleansing: Wound #1 Left,Medial,Plantar Foot: May shower and wash wound with soap and water. Primary Wound Dressing: Wound #1 Left,Medial,Plantar Foot: Hydrofera Blue - classic moistened with saline cut to fit inside wound margins Secondary Dressing: Wound #1 Left,Medial,Plantar Foot: Foam - donut Kerlix/Rolled Gauze Dry Gauze Edema Control: Avoid standing for long periods of time Elevate legs to the level of the heart or above for 30 minutes daily and/or when sitting, a  frequency of: Off-Loading: Open toe surgical shoe to: - left foot The following medication(s) was prescribed: doxycycline hyclate oral 100 mg capsule 1 1 capsule oral taken 2 times per day for 5 days to add on to the current regimen starting 03/20/2020 triamcinolone acetonide topical 0.1 % cream cream topical applied to the left leg until the redness resolves. Should be applied 1-2 times per days starting 03/20/2020 1. I would recommend currently that we go ahead and continue with the [facility] at this point I think that still the best way to go. 2. I am also can recommend that we go ahead and initiate treatment with the triamcinolone to the leg to help with inflammation at this time I think that would be helpful. I am also can give her an additional 5 days of the antibiotics, doxycycline, at this time. We will see patient back for reevaluation in 1 week here in the clinic. If anything worsens or changes patient will contact our office for additional recommendations. Hopefully will be able to initiate the resumption of the total contact cast next week. Electronic Signature(s) Signed: 03/20/2020 3:15:40 PM By: Worthy Keeler PA-C Entered By: Worthy Keeler on 03/20/2020 15:15:39 -------------------------------------------------------------------------------- SuperBill Details Patient Name: Date of Service: [name] [name], [name] RA E. 03/20/2020 Medical Record Number: [id number] Patient Account Number: [id number] Date of Birth/Sex: Treating RN: Dec 30, 1945 (74 y.o. [name] Other Clinician: Referring Provider: Treating Provider/Extender: Adele Schilder in Treatment: 9 Diagnosis Coding ICD-10 Codes Code Description E11.621 Type 2 diabetes mellitus with foot ulcer L97.522 Non-pressure chronic ulcer of other part of left foot with fat layer exposed M14.672 Charcot's joint, left ankle and foot E11.40 Type 2 diabetes mellitus with  diabetic neuropathy, unspecified I10 Essential (primary) hypertension Facility Procedures CPT4 Code: 10071219 Description: 75883 - DEB SUBQ TISSUE 20 SQ CM/< ICD-10 Diagnosis Description L97.522 Non-pressure chronic ulcer of other part of left foot with fat layer exposed Modifier: Quantity: 1 Physician Procedures : CPT4 Code Description Modifier [id number] 99214 - WC PHYS LEVEL 4 - EST PT 25 ICD-10 Diagnosis Description E11.621 Type 2 diabetes mellitus with foot ulcer L97.522 Non-pressure chronic ulcer of other part of left foot with fat layer exposed M14.672  Charcot's joint, left ankle and foot E11.40 Type 2 diabetes mellitus with diabetic neuropathy, unspecified [id number] 11042 - WC PHYS SUBQ TISS 20 SQ CM 1 ICD-10 Diagnosis Description L97.522 Non-pressure chronic ulcer of other part of left foot with fat  layer exposed Quantity: 1 Electronic Signature(s) Signed: 03/20/2020 3:15:54 PM By: Worthy Keeler PA-C Entered By: Worthy Keeler on 03/20/2020 15:15:53

## 2020-03-22 NOTE — Progress Notes (Signed)
[name], [name] ([id number]) Visit Report for 03/20/2020 Arrival Information Details Patient Name: Date of Service: [name] [name], [name] [name] 03/20/2020 1:30 PM Medical Record Number: [id number] Patient Account Number: [id number] Date of Birth/Sex: Treating RN: 1945-11-19 (74 y.o. [name] [name] Primary Care Anedra Penafiel: [name] Other Clinician: Referring Addisynn Vassell: Treating Kymber Kosar/Extender: Adele Schilder in Treatment: 9 Visit Information History Since Last Visit Added or deleted any medications: No Patient Arrived: [name] [name] Any new allergies or adverse reactions: No Arrival Time: 14:09 Had a fall or experienced change in No Accompanied By: self activities of daily living that may affect Transfer Assistance: None risk of falls: Patient Identification Verified: Yes Signs or symptoms of abuse/neglect since last visito No Secondary Verification Process Completed: Yes Hospitalized since last visit: No Patient Requires Transmission-Based Precautions: No Implantable device outside of the clinic excluding No Patient Has Alerts: No cellular tissue based products placed in the center since last visit: Has Dressing in Place as Prescribed: Yes Pain Present Now: No Electronic Signature(s) Signed: 03/22/2020 1:13:21 PM By: Sandre Kitty Entered By: Sandre Kitty on 03/20/2020 14:09:56 -------------------------------------------------------------------------------- Encounter Discharge Information Details Patient Name: Date of Service: [name] [name] RBA RA E. 03/20/2020 1:30 PM Medical Record Number: [id number] Patient Account Number: [id number] Date of Birth/Sex: Treating RN: 13-May-1946 (74 y.o. [name] [name] Primary Care Afton Lavalle: [name] Other Clinician: Referring Hurschel Paynter: Treating Brittnie Lewey/Extender: Adele Schilder in Treatment: 9 Encounter Discharge Information Items Post Procedure Vitals Discharge Condition: Stable Temperature (F):  98 Ambulatory Status: Cane Pulse (bpm): 100 Discharge Destination: Home Respiratory Rate (breaths/min): 18 Transportation: Private Auto Blood Pressure (mmHg): 154/82 Accompanied By: self Schedule Follow-up Appointment: Yes Clinical Summary of Care: Patient Declined Electronic Signature(s) Signed: 03/20/2020 5:47:32 PM By: Kela Millin Entered By: Kela Millin on 03/20/2020 15:24:30 -------------------------------------------------------------------------------- Lower Extremity Assessment Details Patient Name: Date of Service: [name], [name] 03/20/2020 1:30 PM Medical Record Number: [id number] Patient Account Number: [id number] Date of Birth/Sex: Treating RN: Mar 06, 1946 (74 y.o. [name] [name] Primary Care Jayliah Benett: [name] Other Clinician: Referring Naketa Daddario: Treating Antwaun Buth/Extender: Landis Martins Weeks in Treatment: 9 Edema Assessment Assessed: [Left: No] [Right: No] Edema: [Left: Ye] [Right: s] Calf Left: Right: Point of Measurement: 35 cm From Medial Instep 42 cm cm Ankle Left: Right: Point of Measurement: 9 cm From Medial Instep 27 cm cm Vascular Assessment Pulses: Dorsalis Pedis Palpable: [Left:Yes] Electronic Signature(s) Signed: 03/20/2020 5:47:32 PM By: Kela Millin Entered By: Kela Millin on 03/20/2020 14:12:21 -------------------------------------------------------------------------------- Multi-Disciplinary Care Plan Details Patient Name: Date of Service: [name] [name] RBA RA E. 03/20/2020 1:30 PM Medical Record Number: [id number] Patient Account Number: [id number] Date of Birth/Sex: Treating RN: 09/29/1945 (74 y.o. [name] [name] Primary Care Matheau Orona: [name] Other Clinician: Referring TRUE Garciamartinez: Treating Aishani Kalis/Extender: Adele Schilder in Treatment: 9 Active Inactive Nutrition Nursing Diagnoses: Impaired glucose control: actual or potential Potential for alteratiion in  Nutrition/Potential for imbalanced nutrition Goals: Patient/caregiver verbalizes understanding of need to maintain therapeutic glucose control per primary care physician Date Initiated: 01/17/2020 Date Inactivated: 02/14/2020 Target Resolution Date: 02/14/2020 Goal Status: Met Patient/caregiver will maintain therapeutic glucose control Date Initiated: 01/17/2020 Target Resolution Date: 04/10/2020 Goal Status: Active Interventions: Assess HgA1c results as ordered upon admission and as needed Assess patient nutrition upon admission and as needed per policy Provide education on elevated blood sugars and impact on wound healing Treatment Activities: Patient referred to Primary Care Physician for further nutritional evaluation : 01/17/2020 Notes: Wound/Skin Impairment  Nursing Diagnoses: Impaired tissue integrity Knowledge deficit related to ulceration/compromised skin integrity Goals: Patient/caregiver will verbalize understanding of skin care regimen Date Initiated: 01/17/2020 Target Resolution Date: 04/10/2020 Goal Status: Active Ulcer/skin breakdown will have a volume reduction of 30% by week 4 Date Initiated: 01/17/2020 Date Inactivated: 02/14/2020 Target Resolution Date: 02/14/2020 Goal Status: Met Ulcer/skin breakdown will have a volume reduction of 50% by week 8 Date Initiated: 02/14/2020 Date Inactivated: 03/13/2020 Target Resolution Date: 03/13/2020 Goal Status: Met Ulcer/skin breakdown will have a volume reduction of 80% by week 12 Date Initiated: 03/13/2020 Target Resolution Date: 04/10/2020 Goal Status: Active Interventions: Assess patient/caregiver ability to obtain necessary supplies Assess patient/caregiver ability to perform ulcer/skin care regimen upon admission and as needed Assess ulceration(s) every visit Provide education on ulcer and skin care Treatment Activities: Skin care regimen initiated : 01/17/2020 Topical wound management initiated : 01/17/2020 Notes: Electronic  Signature(s) Signed: 03/20/2020 6:14:05 PM By: [name] RN, BSN Entered By: [name] on 03/20/2020 14:59:24 -------------------------------------------------------------------------------- Pain Assessment Details Patient Name: Date of Service: [name] [name] 03/20/2020 1:30 PM Medical Record Number: [id number] Patient Account Number: [id number] Date of Birth/Sex: Treating RN: 1946/04/21 (74 y.o. [name] [name] Primary Care Willma Obando: [name] Other Clinician: Referring Robinn Overholt: Treating Sawyer Mentzer/Extender: Adele Schilder in Treatment: 9 Active Problems Location of Pain Severity and Description of Pain Patient Has Paino No Site Locations Pain Management and Medication Current Pain Management: Electronic Signature(s) Signed: 03/20/2020 6:14:05 PM By: [name] RN, BSN Signed: 03/22/2020 1:13:21 PM By: Sandre Kitty Entered By: Sandre Kitty on 03/20/2020 14:10:15 -------------------------------------------------------------------------------- Patient/Caregiver Education Details Patient Name: Date of Service: [name] [name] 8/11/2021andnbsp1:30 PM Medical Record Number: [id number] Patient Account Number: [id number] Date of Birth/Gender: Treating RN: 1946-07-01 (74 y.o. [name] [name] Primary Care Physician: [name] Other Clinician: Referring Physician: Treating Physician/Extender: Adele Schilder in Treatment: 9 Education Assessment Education Provided To: Patient Education Topics Provided Elevated Blood Sugar/ Impact on Healing: Methods: Explain/Verbal Responses: Reinforcements needed, State content correctly Offloading: Methods: Explain/Verbal Responses: Reinforcements needed, State content correctly Wound/Skin Impairment: Methods: Explain/Verbal Responses: Reinforcements needed, State content correctly Electronic Signature(s) Signed: 03/20/2020 6:14:05 PM By: [name] RN,  BSN Entered By: [name] on 03/20/2020 14:59:55 -------------------------------------------------------------------------------- Wound Assessment Details Patient Name: Date of Service: [name] [name] 03/20/2020 1:30 PM Medical Record Number: [id number] Patient Account Number: [id number] Date of Birth/Sex: Treating RN: [birth year] (74 y.o. [name] [name] Primary Care Alasha Mcguinness: [name] Other Clinician: Referring Del Overfelt: Treating Bryonna Sundby/Extender: Landis Martins Weeks in Treatment: 9 Wound Status Wound Number: 1 Primary Diabetic Wound/Ulcer of the Lower Extremity Etiology: Wound Location: Left, Medial, Plantar Foot Wound Open Wounding Event: Gradually Appeared Status: Date Acquired: 05/11/2019 Comorbid Sleep Apnea, Deep Vein Thrombosis, Hypertension, Colitis, Type Weeks Of Treatment: 9 History: II Diabetes, Gout, Osteoarthritis, Neuropathy Clustered Wound: No Photos Photo Uploaded By: Mikeal Hawthorne on 03/22/2020 11:02:20 Wound Measurements Length: (cm) 0.9 % R Width: (cm) 1.1 % R Depth: (cm) 0.3 Epi Area: (cm) 0.778 Tu Volume: (cm) 0.233 Un eduction in Area: 23.8% eduction in Volume: 42.9% thelialization: Small (1-33%) nneling: No dermining: No Wound Description Classification: Grade 2 Fou Wound Margin: Well defined, not attached Slo Exudate Amount: Small Exudate Type: Serosanguineous Exudate Color: red, brown l Odor After Cleansing: No ugh/Fibrino Yes Wound Bed Granulation Amount: Large (67-100%) Exposed Structure Granulation Quality: Red, Pink Fascia Exposed: No Necrotic Amount: Small (1-33%) Fat Layer (Subcutaneous Tissue) Exposed: Yes Necrotic  Quality: Adherent Slough Tendon Exposed: No Muscle Exposed: No Joint Exposed: No Bone Exposed: No Treatment Notes Wound #1 (Left, Medial, Plantar Foot) 1. Cleanse With Wound Cleanser 2. Periwound Care TCA Ointment 3. Primary Dressing Applied Hydrofera Blue 4. Secondary  Dressing Dry Gauze Roll Gauze Foam 5. Secured With Recruitment consultant) Signed: 03/20/2020 5:47:32 PM By: Kela Millin Entered By: Kela Millin on 03/20/2020 14:15:07 -------------------------------------------------------------------------------- Vitals Details Patient Name: Date of Service: [name] [name] RBA RA E. 03/20/2020 1:30 PM Medical Record Number: [id number] Patient Account Number: [id number] Date of Birth/Sex: Treating RN: Apr 05, 1946 (74 y.o. [name] [name] Primary Care Elyana Grabski: [name] Other Clinician: Referring Nevae Pinnix: Treating Idalis Hoelting/Extender: Landis Martins Weeks in Treatment: 9 Vital Signs Time Taken: 14:09 Temperature (F): 98.0 Height (in): 66 Pulse (bpm): 100 Weight (lbs): 245 Respiratory Rate (breaths/min): 18 Body Mass Index (BMI): 39.5 Blood Pressure (mmHg): 154/82 Reference Range: 80 - 120 mg / dl Electronic Signature(s) Signed: 03/22/2020 1:13:21 PM By: Sandre Kitty Entered By: Sandre Kitty on 03/20/2020 14:10:09

## 2020-03-27 ENCOUNTER — Encounter (HOSPITAL_BASED_OUTPATIENT_CLINIC_OR_DEPARTMENT_OTHER): Payer: Medicare Other | Admitting: Physician Assistant

## 2020-03-27 DIAGNOSIS — E1151 Type 2 diabetes mellitus with diabetic peripheral angiopathy without gangrene: Secondary | ICD-10-CM | POA: Diagnosis not present

## 2020-03-27 DIAGNOSIS — E1140 Type 2 diabetes mellitus with diabetic neuropathy, unspecified: Secondary | ICD-10-CM | POA: Diagnosis not present

## 2020-03-27 DIAGNOSIS — L97522 Non-pressure chronic ulcer of other part of left foot with fat layer exposed: Secondary | ICD-10-CM | POA: Diagnosis not present

## 2020-03-27 DIAGNOSIS — E669 Obesity, unspecified: Secondary | ICD-10-CM | POA: Diagnosis not present

## 2020-03-27 DIAGNOSIS — M14672 Charcot's joint, left ankle and foot: Secondary | ICD-10-CM | POA: Diagnosis not present

## 2020-03-27 DIAGNOSIS — E11621 Type 2 diabetes mellitus with foot ulcer: Secondary | ICD-10-CM | POA: Diagnosis not present

## 2020-03-27 DIAGNOSIS — I10 Essential (primary) hypertension: Secondary | ICD-10-CM | POA: Diagnosis not present

## 2020-03-27 NOTE — Progress Notes (Addendum)
[name] [name], [name] [name] ([id number]) Visit Report for 03/27/2020 Chief Complaint Document Details Patient Name: Date of Service: [name], [name] 03/27/2020 2:15 PM Medical Record Number: [id number] Patient Account Number: [id number] Date of Birth/Sex: Treating RN: 1945/12/02 (75 y.o. [name] Other Clinician: Referring Provider: Treating Provider/Extender: Landis Martins Weeks in Treatment: 10 Information Obtained from: Patient Chief Complaint Left foot ulcer Electronic Signature(s) Signed: 03/27/2020 2:57:20 PM By: Worthy Keeler PA-C Entered By: Worthy Keeler on 03/27/2020 14:57:20 -------------------------------------------------------------------------------- HPI Details Patient Name: Date of Service: [name], [name] 03/27/2020 2:15 PM Medical Record Number: [id number] Patient Account Number: [id number] Date of Birth/Sex: Treating RN: 01/24/1946 (74 y.o. [name] Other Clinician: Referring Provider: Treating Provider/Extender: Adele Schilder in Treatment: 10 History of Present Illness HPI Description: 01/17/2020 upon evaluation today patient presents for initial evaluation here in our clinic concerning issues she has been having with a left medial/plantar foot ulcer. This is actually been an issue for her since October 2020. She has been seeing Dr. Doran Durand for quite some time during that course. Fortunately there is no signs of active infection at this time. Or least no mention of this to have seen in general. With that being said unfortunately I do see some signs of erythema noted today that does have me concerned about the possibility of infection at this point in the surrounding area of the wound. There is also a warm to touch at the site which is somewhat concerning. Fortunately there is no evidence of systemic infection which is great news. The patient  does have a history of diabetes mellitus type 2, Charcot foot which is what led to the wound, and hypertension. She notes that she was in a cast for some time with Dr. Doran Durand for about 8 weeks. During that time they were utilizing according to the patient silver nitrate along with a foam doughnut and then Coban to secure in place in the cast in place. With that being said I do not have the actual records to review we are going to try to get a hold of those unfortunately they would not flow over into care everywhere I did look today. She has been seeing Dr. Doran Durand and his physician assistant Larkin Ina up until the end of May and apparently is still seeing them on a regular basis every 2 weeks roughly. She has also tried Iodosorb without effect here. 01/24/2020 upon evaluation today patient actually appears to be doing quite well with regard to her wounds. She has been tolerating the dressing changes without complication. Fortunately there is no signs of active infection spreading which is good news. Her culture did show signs of Staph aureus I did place her on Augmentin due to the erythema surrounding the wound. With that being said the wound does appear to be doing better she has her longer walking cast/boot and I think that is actually good for her for the time being. I am considering reinitiating total contact cast when she gets back from vacation but next week she will actually be out of town at [employer] she knows not to get in the water but she still obviously is planning to enjoy herself she is going to take it easy on her foot however. 02/07/2020 upon evaluation today patient appears to be doing fairly well in regard to her ulcer on her foot. Fortunately there is no signs of  severe infection at this time which is great news and overall very pleased in that regard. With that being said I do think that she could still benefit from a total contact cast. Nonetheless she is using her walking boot which at  least provide some protection and that it prevents some of the friction occurring when she is ambulating. 02/14/2020 upon evaluation today patient appears to be doing well with regard to her foot ulcer. This is actually measuring a little bit smaller yet again this week. Overall very pleased with where things stand and I do not see any signs of active infection at this time which is also good news. Since she is measuring better the patient has wanting to somewhat hold off on proceeding with the total contact cast which I think is reasonable at this point. 02/28/2020 on evaluation today patient appears to be doing well in general in regard to her wound although she has a lot of callus buildup as compared to last time I saw her. This is can require sharp debridement today. I do believe she really needs the total contact cast as well which we have discussed previous. 7/23; patient comes in for a total contact cast change 03/06/2020 on evaluation today patient appears to be doing quite well with regard to her wounds. Fortunately the wound bed is measuring smaller and looking much better there is little callus noted although there is some debridement necessary today. 03/13/2020 on evaluation today patient's wound actually appears to be doing excellent which is great news. With that being said unfortunately she is having some issues currently with her left leg where she does have cellulitis it appears. This may have come from an area that rubbed underneath the cast from last week that we noted we padded that area and it looks to be doing excellent at this point but nonetheless the leg was somewhat painful, swollen, and somewhat erythematous. She also had an elevated white blood cell count of 11.5 based on what I saw on looking at her records from the med center in [place] from where she was seen yesterday. Unfortunately with [place] having a provider on vacation there was no one here in the clinic in the afternoon  when she called therefore she went to the ER as advised. Subsequently they did not cut off the cast as they did not have anyone from orthopedics there to do so and subsequently also did not have the ability to do the Doppler for evaluation of DVT They recommended therefore given her dose of Eliquis as well as . Augmentin and sent her home to come see [place] today to have the cast taken off and then she is supposed to go back to have the study for DVT performed they are following. 03/20/2020 upon evaluation today patient appears to be doing well with regard to her foot all things considered we have not been able to use the total contact cast due to the infection that she had last week. She has been on the doxycycline and she had a 10-day supply of that I do believe that is helping and her leg appears to be doing better. With that being said there is fortunately no signs of active infection systemically at this time which is good news. No fevers, chills, nausea, vomiting, or diarrhea. 03/27/2020 upon evaluation today patient appears to be doing well with regard to her foot ulcer. There does not appear to be signs of active infection which is great news. Overall I  am very pleased with where things stand at this point. Electronic Signature(s) Signed: 03/27/2020 3:45:38 PM By: Worthy Keeler PA-C Entered By: Worthy Keeler on 03/27/2020 15:45:37 -------------------------------------------------------------------------------- Physical Exam Details Patient Name: Date of Service: Sant, [name] 03/27/2020 2:15 PM Medical Record Number: [id number] Patient Account Number: [id number] Date of Birth/Sex: Treating RN: 1945/10/18 (74 y.o. [name] Other Clinician: Referring Provider: Treating Provider/Extender: Landis Martins Weeks in Treatment: 10 Constitutional Well-nourished and well-hydrated in no acute distress. Respiratory normal breathing  without difficulty. Psychiatric this patient is able to make decisions and demonstrates good insight into disease process. Alert and Oriented x 3. pleasant and cooperative. Notes Patient's wound bed currently did not require any sharp debridement which is great news overall I feel like she is managing quite nicely with regard to the wound and I am very pleased with how things appear at this time. There is no signs of active infection currently. No sharp debridement was necessary. For that reason I discussed that we could potentially hold off on the total contact cast and see how things do over the next week I would like to switch to a different dressing possibly collagen to see if that would make a difference for her. Electronic Signature(s) Signed: 03/27/2020 3:46:27 PM By: Worthy Keeler PA-C Entered By: Worthy Keeler on 03/27/2020 15:46:27 -------------------------------------------------------------------------------- Physician Orders Details Patient Name: Date of Service: Mizrahi, [name] 03/27/2020 2:15 PM Medical Record Number: [id number] Patient Account Number: [id number] Date of Birth/Sex: Treating RN: 09/13/1945 (74 y.o. [name] Other Clinician: Referring Provider: Treating Provider/Extender: Adele Schilder in Treatment: 10 Verbal / Phone Orders: No Diagnosis Coding ICD-10 Coding Code Description E11.621 Type 2 diabetes mellitus with foot ulcer L97.522 Non-pressure chronic ulcer of other part of left foot with fat layer exposed M14.672 Charcot's joint, left ankle and foot E11.40 Type 2 diabetes mellitus with diabetic neuropathy, unspecified I10 Essential (primary) hypertension Follow-up Appointments ppointment in 1 week. - with Margarita Grizzle Return A Dressing Change Frequency Wound #1 Left,Medial,Plantar Foot Change Dressing every other day. Skin Barriers/Peri-Wound Care Moisturizing lotion - to foot with  dressing changes TCA Cream or Ointment - triamcinolone cream to left leg rash daily Wound Cleansing Wound #1 Left,Medial,Plantar Foot May shower and wash wound with soap and water. Primary Wound Dressing Wound #1 Left,Medial,Plantar Foot Silver Collagen - moisten with saline Secondary Dressing Wound #1 Left,Medial,Plantar Foot Foam - donut Kerlix/Rolled Gauze Dry Gauze Drawtex - cut to fit inside wound to hold collagen in place Edema Control Avoid standing for long periods of time Elevate legs to the level of the heart or above for 30 minutes daily and/or when sitting, a frequency of: Off-Loading Open toe surgical shoe to: - left foot Electronic Signature(s) Signed: 03/27/2020 5:00:27 PM By: Worthy Keeler PA-C Signed: 03/27/2020 5:37:29 PM By: [name] RN, BSN Entered By: [name] on 03/27/2020 15:41:31 -------------------------------------------------------------------------------- Problem List Details Patient Name: Date of Service: [name] 03/27/2020 2:15 PM Medical Record Number: [id number] Patient Account Number: [id number] Date of Birth/Sex: Treating RN: [birth year] (74 y.o. [name] Other Clinician: Referring Provider: Treating Provider/Extender: Adele Schilder in Treatment: 10 Active Problems ICD-10 Encounter Code Description Active Date MDM Diagnosis E11.621 Type 2 diabetes mellitus with foot ulcer 01/17/2020 No Yes L97.522 Non-pressure chronic ulcer of other part of  left foot with fat layer exposed 01/17/2020 No Yes M14.672 Charcot's joint, left ankle and foot 01/17/2020 No Yes E11.40 Type 2 diabetes mellitus with diabetic neuropathy, unspecified 01/17/2020 No Yes I10 Essential (primary) hypertension 01/17/2020 No Yes Inactive Problems Resolved Problems Electronic Signature(s) Signed: 03/27/2020 2:57:09 PM By: Worthy Keeler PA-C Entered By: Worthy Keeler on 03/27/2020  14:57:09 -------------------------------------------------------------------------------- Progress Note Details Patient Name: Date of Service: Stankey, [name] 03/27/2020 2:15 PM Medical Record Number: [id number] Patient Account Number: [id number] Date of Birth/Sex: Treating RN: 06/13/1946 (74 y.o. [name] Other Clinician: Referring Provider: Treating Provider/Extender: Adele Schilder in Treatment: 10 Subjective Chief Complaint Information obtained from Patient Left foot ulcer History of Present Illness (HPI) 01/17/2020 upon evaluation today patient presents for initial evaluation here in our clinic concerning issues she has been having with a left medial/plantar foot ulcer. This is actually been an issue for her since October 2020. She has been seeing Dr. Doran Durand for quite some time during that course. Fortunately there is no signs of active infection at this time. Or least no mention of this to have seen in general. With that being said unfortunately I do see some signs of erythema noted today that does have me concerned about the possibility of infection at this point in the surrounding area of the wound. There is also a warm to touch at the site which is somewhat concerning. Fortunately there is no evidence of systemic infection which is great news. The patient does have a history of diabetes mellitus type 2, Charcot foot which is what led to the wound, and hypertension. She notes that she was in a cast for some time with Dr. Doran Durand for about 8 weeks. During that time they were utilizing according to the patient silver nitrate along with a foam doughnut and then Coban to secure in place in the cast in place. With that being said I do not have the actual records to review we are going to try to get a hold of those unfortunately they would not flow over into care everywhere I did look today. She has been seeing Dr. Doran Durand and  his physician assistant Larkin Ina up until the end of May and apparently is still seeing them on a regular basis every 2 weeks roughly. She has also tried Iodosorb without effect here. 01/24/2020 upon evaluation today patient actually appears to be doing quite well with regard to her wounds. She has been tolerating the dressing changes without complication. Fortunately there is no signs of active infection spreading which is good news. Her culture did show signs of Staph aureus I did place her on Augmentin due to the erythema surrounding the wound. With that being said the wound does appear to be doing better she has her longer walking cast/boot and I think that is actually good for her for the time being. I am considering reinitiating total contact cast when she gets back from vacation but next week she will actually be out of town at [employer] she knows not to get in the water but she still obviously is planning to enjoy herself she is going to take it easy on her foot however. 02/07/2020 upon evaluation today patient appears to be doing fairly well in regard to her ulcer on her foot. Fortunately there is no signs of severe infection at this time which is great news and overall very pleased in that regard. With that being said  I do think that she could still benefit from a total contact cast. Nonetheless she is using her walking boot which at least provide some protection and that it prevents some of the friction occurring when she is ambulating. 02/14/2020 upon evaluation today patient appears to be doing well with regard to her foot ulcer. This is actually measuring a little bit smaller yet again this week. Overall very pleased with where things stand and I do not see any signs of active infection at this time which is also good news. Since she is measuring better the patient has wanting to somewhat hold off on proceeding with the total contact cast which I think is reasonable at this point. 02/28/2020 on  evaluation today patient appears to be doing well in general in regard to her wound although she has a lot of callus buildup as compared to last time I saw her. This is can require sharp debridement today. I do believe she really needs the total contact cast as well which we have discussed previous. 7/23; patient comes in for a total contact cast change 03/06/2020 on evaluation today patient appears to be doing quite well with regard to her wounds. Fortunately the wound bed is measuring smaller and looking much better there is little callus noted although there is some debridement necessary today. 03/13/2020 on evaluation today patient's wound actually appears to be doing excellent which is great news. With that being said unfortunately she is having some issues currently with her left leg where she does have cellulitis it appears. This may have come from an area that rubbed underneath the cast from last week that we noted we padded that area and it looks to be doing excellent at this point but nonetheless the leg was somewhat painful, swollen, and somewhat erythematous. She also had an elevated white blood cell count of 11.5 based on what I saw on looking at her records from the med center in [facility] from where she was seen yesterday. Unfortunately with [place] having a provider on vacation there was no one here in the clinic in the afternoon when she called therefore she went to the ER as advised. Subsequently they did not cut off the cast as they did not have anyone from orthopedics there to do so and subsequently also did not have the ability to do the Doppler for evaluation of DVT They recommended therefore given her dose of Eliquis as well as . Augmentin and sent her home to come see [place] today to have the cast taken off and then she is supposed to go back to have the study for DVT performed they are following. 03/20/2020 upon evaluation today patient appears to be doing well with regard to her foot all  things considered we have not been able to use the total contact cast due to the infection that she had last week. She has been on the doxycycline and she had a 10-day supply of that I do believe that is helping and her leg appears to be doing better. With that being said there is fortunately no signs of active infection systemically at this time which is good news. No fevers, chills, nausea, vomiting, or diarrhea. 03/27/2020 upon evaluation today patient appears to be doing well with regard to her foot ulcer. There does not appear to be signs of active infection which is great news. Overall I am very pleased with where things stand at this point. Objective Constitutional Well-nourished and well-hydrated in no acute distress. Vitals  Time Taken: 3:00 PM, Height: 66 in, Weight: 245 lbs, BMI: 39.5, Temperature: 98.4 F, Pulse: 70 bpm, Respiratory Rate: 18 breaths/min, Blood Pressure: 131/70 mmHg. Respiratory normal breathing without difficulty. Psychiatric this patient is able to make decisions and demonstrates good insight into disease process. Alert and Oriented x 3. pleasant and cooperative. General Notes: Patient's wound bed currently did not require any sharp debridement which is great news overall I feel like she is managing quite nicely with regard to the wound and I am very pleased with how things appear at this time. There is no signs of active infection currently. No sharp debridement was necessary. For that reason I discussed that we could potentially hold off on the total contact cast and see how things do over the next week I would like to switch to a different dressing possibly collagen to see if that would make a difference for her. Integumentary (Hair, Skin) Wound #1 status is Open. Original cause of wound was Gradually Appeared. The wound is located on the [place]. The wound measures 0.7cm length x 1cm width x 0.3cm depth; 0.55cm^2 area and 0.165cm^3 volume. There  is Fat Layer (Subcutaneous Tissue) Exposed exposed. There is no tunneling or undermining noted. There is a small amount of serosanguineous drainage noted. The wound margin is well defined and not attached to the wound base. There is large (67-100%) red granulation within the wound bed. There is a small (1-33%) amount of necrotic tissue within the wound bed including Adherent Slough. Assessment Active Problems ICD-10 Type 2 diabetes mellitus with foot ulcer Non-pressure chronic ulcer of other part of left foot with fat layer exposed Charcot's joint, left ankle and foot Type 2 diabetes mellitus with diabetic neuropathy, unspecified Essential (primary) hypertension Plan Follow-up Appointments: Return Appointment in 1 week. - with Margarita Grizzle Dressing Change Frequency: Wound #1 Left,Medial,Plantar Foot: Change Dressing every other day. Skin Barriers/Peri-Wound Care: Moisturizing lotion - to foot with dressing changes TCA Cream or Ointment - triamcinolone cream to left leg rash daily Wound Cleansing: Wound #1 Left,Medial,Plantar Foot: May shower and wash wound with soap and water. Primary Wound Dressing: Wound #1 Left,Medial,Plantar Foot: Silver Collagen - moisten with saline Secondary Dressing: Wound #1 Left,Medial,Plantar Foot: Foam - donut Kerlix/Rolled Gauze Dry Gauze Drawtex - cut to fit inside wound to hold collagen in place Edema Control: Avoid standing for long periods of time Elevate legs to the level of the heart or above for 30 minutes daily and/or when sitting, a frequency of: Off-Loading: Open toe surgical shoe to: - left foot 1. I would recommend that we continue with the wound care measures as before with regard to offloading and using the surgical shoe I think that is a good way to go. 2. I would recommend that we switch to a silver collagen dressing I think that is probably to be a better option for her at this point it seems like the wound is very dry. 3. I am also can  recommend the patient continue to try to limit her walking is much as possible to keep pressure under control as far as what she is sustaining to the foot area. We will see patient back for reevaluation in 1 week here in the clinic. If anything worsens or changes patient will contact our office for additional recommendations. Electronic Signature(s) Signed: 03/27/2020 3:47:04 PM By: Worthy Keeler PA-C Entered By: Worthy Keeler on 03/27/2020 15:47:03 -------------------------------------------------------------------------------- SuperBill Details Patient Name: Date of Service: Ascher, Hancock RBA RA E. 03/27/2020 Medical  Record Number: [id number] Patient Account Number: [id number] Date of Birth/Sex: Treating RN: 1945/12/10 (74 y.o. [name], [name] Primary Care Provider: [name] Other Clinician: Referring Provider: Treating Provider/Extender: Landis Martins Weeks in Treatment: 10 Diagnosis Coding ICD-10 Codes Code Description E11.621 Type 2 diabetes mellitus with foot ulcer L97.522 Non-pressure chronic ulcer of other part of left foot with fat layer exposed M14.672 Charcot's joint, left ankle and foot E11.40 Type 2 diabetes mellitus with diabetic neuropathy, unspecified I10 Essential (primary) hypertension Facility Procedures CPT4 Code: [id number] Description: 96886 - WOUND CARE VISIT-LEV 3 EST PT Modifier: Quantity: 1 Physician Procedures Electronic Signature(s) Signed: 03/27/2020 3:47:16 PM By: Worthy Keeler PA-C Entered By: Worthy Keeler on 03/27/2020 15:47:16

## 2020-03-28 NOTE — Progress Notes (Signed)
JAZIYAH, GRADEL ([id number]) Visit Report for 03/27/2020 Arrival Information Details Patient Name: Date of Service: [name] [name], [name] E. 03/27/2020 2:15 PM Medical Record Number: [id number] Patient Account Number: [id number] Date of Birth/Sex: Treating RN: 1946/04/28 (74 y.o. [name], [name] Primary Care Quincie Haroon: [name] Other Clinician: Referring Sarita Hakanson: Treating Caydan Mctavish/Extender: Adele Schilder in Treatment: 10 Visit Information History Since Last Visit Added or deleted any medications: No Patient Arrived: Wheel Chair Any new allergies or adverse reactions: No Arrival Time: 14:58 Had a fall or experienced change in No Accompanied By: self activities of daily living that may affect Transfer Assistance: None risk of falls: Patient Identification Verified: Yes Signs or symptoms of abuse/neglect since last visito No Secondary Verification Process Completed: Yes Hospitalized since last visit: No Patient Requires Transmission-Based Precautions: No Implantable device outside of the clinic excluding No Patient Has Alerts: No cellular tissue based products placed in the center since last visit: Has Dressing in Place as Prescribed: Yes Pain Present Now: No Electronic Signature(s) Signed: 03/28/2020 1:30:39 PM By: Sandre Kitty Entered By: Sandre Kitty on 03/27/2020 14:58:41 -------------------------------------------------------------------------------- Encounter Discharge Information Details Patient Name: Date of Service: [name] Guarneri RBA RA E. 03/27/2020 2:15 PM Medical Record Number: [id number] Patient Account Number: [id number] Date of Birth/Sex: Treating RN: [birth year] (74 y.o. [name] Primary Care Rees Santistevan: [name] Other Clinician: Referring Beren Yniguez: Treating Cairo Lingenfelter/Extender: Adele Schilder in Treatment: 10 Encounter Discharge Information Items Discharge Condition: Stable Ambulatory Status: Cane Discharge  Destination: Home Transportation: Private Auto Accompanied By: alone Schedule Follow-up Appointment: Yes Clinical Summary of Care: Patient Declined Electronic Signature(s) Signed: 03/28/2020 5:36:57 PM By: Levan Hurst RN, BSN Entered By: Levan Hurst on 03/27/2020 17:27:44 -------------------------------------------------------------------------------- Lower Extremity Assessment Details Patient Name: Date of Service: [name] [name], [name] RA E. 03/27/2020 2:15 PM Medical Record Number: [id number] Patient Account Number: [id number] Date of Birth/Sex: Treating RN: 1945-12-02 (74 y.o. [name] Primary Care Azariya Freeman: [name] Other Clinician: Referring Anijah Spohr: Treating Sylvanna Burggraf/Extender: Landis Martins Weeks in Treatment: 10 Edema Assessment Assessed: [Left: No] [Right: No] Edema: [Left: Ye] [Right: s] Calf Left: Right: Point of Measurement: 35 cm From Medial Instep 38 cm cm Ankle Left: Right: Point of Measurement: 9 cm From Medial Instep 27 cm cm Vascular Assessment Pulses: Dorsalis Pedis Palpable: [Left:Yes] Electronic Signature(s) Signed: 03/28/2020 5:36:57 PM By: Levan Hurst RN, BSN Entered By: Levan Hurst on 03/27/2020 15:09:04 -------------------------------------------------------------------------------- [place] Details Patient Name: Date of Service: [name] Guarneri RBA RA E. 03/27/2020 2:15 PM Medical Record Number: [id number] Patient Account Number: [id number] Date of Birth/Sex: Treating RN: 13-Sep-1945 (74 y.o. [name] Primary Care Shanitha Twining: [name] Other Clinician: Referring Kemonte Ullman: Treating Sierrah Luevano/Extender: Adele Schilder in Treatment: 10 Active Inactive Nutrition Nursing Diagnoses: Impaired glucose control: actual or potential Potential for alteratiion in Nutrition/Potential for imbalanced nutrition Goals: Patient/caregiver verbalizes understanding of need to maintain  therapeutic glucose control per primary care physician Date Initiated: 01/17/2020 Date Inactivated: 02/14/2020 Target Resolution Date: 02/14/2020 Goal Status: Met Patient/caregiver will maintain therapeutic glucose control Date Initiated: 01/17/2020 Target Resolution Date: 04/10/2020 Goal Status: Active Interventions: Assess HgA1c results as ordered upon admission and as needed Assess patient nutrition upon admission and as needed per policy Provide education on elevated blood sugars and impact on wound healing Treatment Activities: Patient referred to Primary Care Physician for further nutritional evaluation : 01/17/2020 Notes: Wound/Skin Impairment Nursing Diagnoses: Impaired tissue integrity Knowledge deficit related to ulceration/compromised skin integrity  Goals: Patient/caregiver will verbalize understanding of skin care regimen Date Initiated: 01/17/2020 Target Resolution Date: 04/10/2020 Goal Status: Active Ulcer/skin breakdown will have a volume reduction of 30% by week 4 Date Initiated: 01/17/2020 Date Inactivated: 02/14/2020 Target Resolution Date: 02/14/2020 Goal Status: Met Ulcer/skin breakdown will have a volume reduction of 50% by week 8 Date Initiated: 02/14/2020 Date Inactivated: 03/13/2020 Target Resolution Date: 03/13/2020 Goal Status: Met Ulcer/skin breakdown will have a volume reduction of 80% by week 12 Date Initiated: 03/13/2020 Target Resolution Date: 04/10/2020 Goal Status: Active Interventions: Assess patient/caregiver ability to obtain necessary supplies Assess patient/caregiver ability to perform ulcer/skin care regimen upon admission and as needed Assess ulceration(s) every visit Provide education on ulcer and skin care Treatment Activities: Skin care regimen initiated : 01/17/2020 Topical wound management initiated : 01/17/2020 Notes: Electronic Signature(s) Signed: 03/27/2020 5:37:29 PM By: [name] RN, BSN Entered By: [name] on 03/27/2020  15:20:10 -------------------------------------------------------------------------------- Pain Assessment Details Patient Name: Date of Service: [name] Amato RA E. 03/27/2020 2:15 PM Medical Record Number: [id number] Patient Account Number: [id number] Date of Birth/Sex: Treating RN: [birth year] (74 y.o. [name] Primary Care Ayleen Mckinstry: [name] Other Clinician: Referring Ladiamond Gallina: Treating Saadiq Poche/Extender: Landis Martins Weeks in Treatment: 10 Active Problems Location of Pain Severity and Description of Pain Patient Has Paino No Site Locations Pain Management and Medication Current Pain Management: Electronic Signature(s) Signed: 03/27/2020 5:37:29 PM By: [name] RN, BSN Signed: 03/28/2020 1:30:39 PM By: Sandre Kitty Entered By: Sandre Kitty on 03/27/2020 15:00:39 -------------------------------------------------------------------------------- Patient/Caregiver Education Details Patient Name: Date of Service: [name] Amato RA E. 8/18/2021andnbsp2:15 PM Medical Record Number: [id number] Patient Account Number: [id number] Date of Birth/Gender: Treating RN: 1945-11-06 (74 y.o. [name] Primary Care Physician: [name] Other Clinician: Referring Physician: Treating Physician/Extender: Adele Schilder in Treatment: 10 Education Assessment Education Provided To: Patient Education Topics Provided Venous: Methods: Explain/Verbal Responses: Reinforcements needed, State content correctly Wound/Skin Impairment: Methods: Explain/Verbal Responses: Reinforcements needed, State content correctly Electronic Signature(s) Signed: 03/27/2020 5:37:29 PM By: [name] RN, BSN Entered By: [name] on 03/27/2020 15:20:47 -------------------------------------------------------------------------------- Wound Assessment Details Patient Name: Date of Service: [name] Guarneri RBA RA E. 03/27/2020 2:15 PM Medical  Record Number: [id number] Patient Account Number: [id number] Date of Birth/Sex: Treating RN: 1946/04/23 (74 y.o. [name] Primary Care Shaday Rayborn: [name] Other Clinician: Referring Raphaela Cannaday: Treating Rafia Shedden/Extender: Landis Martins Weeks in Treatment: 10 Wound Status Wound Number: 1 Primary Diabetic Wound/Ulcer of the Lower Extremity Etiology: Wound Location: Left, Medial, Plantar Foot Wound Open Wounding Event: Gradually Appeared Status: Date Acquired: 05/11/2019 Comorbid Sleep Apnea, Deep Vein Thrombosis, Hypertension, Colitis, Type Weeks Of Treatment: 10 History: II Diabetes, Gout, Osteoarthritis, Neuropathy Clustered Wound: No Photos Photo Uploaded By: Mikeal Hawthorne on 03/28/2020 10:58:16 Wound Measurements Length: (cm) 0.7 Width: (cm) 1 Depth: (cm) 0.3 Area: (cm) 0.55 Volume: (cm) 0.165 % Reduction in Area: 46.1% % Reduction in Volume: 59.6% Epithelialization: Small (1-33%) Tunneling: No Undermining: No Wound Description Classification: Grade 2 Wound Margin: Well defined, not attached Exudate Amount: Small Exudate Type: Serosanguineous Exudate Color: red, brown Foul Odor After Cleansing: No Slough/Fibrino Yes Wound Bed Granulation Amount: Large (67-100%) Exposed Structure Granulation Quality: Red Fascia Exposed: No Necrotic Amount: Small (1-33%) Fat Layer (Subcutaneous Tissue) Exposed: Yes Necrotic Quality: Adherent Slough Tendon Exposed: No Muscle Exposed: No Joint Exposed: No Bone Exposed: No Treatment Notes Wound #1 (Left, Medial, Plantar Foot) 1. Cleanse With Wound Cleanser 3. Primary Dressing Applied [employer]  4. Secondary Dressing Dry Gauze Roll Gauze Foam Drawtex 5. Secured With Recruitment consultant) Signed: 03/27/2020 5:37:29 PM By: [name] RN, BSN Signed: 03/28/2020 5:36:57 PM By: Levan Hurst RN, BSN Entered By: Levan Hurst on 03/27/2020  15:09:18 -------------------------------------------------------------------------------- [place] Details Patient Name: Date of Service: [place], [name] RA E. 03/27/2020 2:15 PM Medical Record Number: [id number] Patient Account Number: [id number] Date of Birth/Sex: Treating RN: May 23, 1946 (74 y.o. [name] Primary Care Rebecca Motta: [name] Other Clinician: Referring Antinette Keough: Treating Jaequan Propes/Extender: Landis Martins Weeks in Treatment: 10 Vital Signs Time Taken: 15:00 Temperature (F): 98.4 Height (in): 66 Pulse (bpm): 70 Weight (lbs): 245 Respiratory Rate (breaths/min): 18 Body Mass Index (BMI): 39.5 Blood Pressure (mmHg): 131/70 Reference Range: 80 - 120 mg / dl Electronic Signature(s) Signed: 03/28/2020 1:30:39 PM By: Sandre Kitty Entered By: Sandre Kitty on 03/27/2020 15:00:24

## 2020-03-31 ENCOUNTER — Other Ambulatory Visit: Payer: Self-pay | Admitting: Internal Medicine

## 2020-04-02 ENCOUNTER — Ambulatory Visit: Payer: Medicare Other | Admitting: Internal Medicine

## 2020-04-03 ENCOUNTER — Encounter (HOSPITAL_BASED_OUTPATIENT_CLINIC_OR_DEPARTMENT_OTHER): Payer: Medicare Other | Admitting: Physician Assistant

## 2020-04-03 DIAGNOSIS — E1151 Type 2 diabetes mellitus with diabetic peripheral angiopathy without gangrene: Secondary | ICD-10-CM | POA: Diagnosis not present

## 2020-04-03 DIAGNOSIS — E669 Obesity, unspecified: Secondary | ICD-10-CM | POA: Diagnosis not present

## 2020-04-03 DIAGNOSIS — E1140 Type 2 diabetes mellitus with diabetic neuropathy, unspecified: Secondary | ICD-10-CM | POA: Diagnosis not present

## 2020-04-03 DIAGNOSIS — I10 Essential (primary) hypertension: Secondary | ICD-10-CM | POA: Diagnosis not present

## 2020-04-03 DIAGNOSIS — L97522 Non-pressure chronic ulcer of other part of left foot with fat layer exposed: Secondary | ICD-10-CM | POA: Diagnosis not present

## 2020-04-03 DIAGNOSIS — E11621 Type 2 diabetes mellitus with foot ulcer: Secondary | ICD-10-CM | POA: Diagnosis not present

## 2020-04-03 NOTE — Progress Notes (Addendum)
[name] [name], [name] [name] ([id number]) Visit Report for 04/03/2020 Chief Complaint Document Details Patient Name: Date of Service: [name] [name], [name] E. 04/03/2020 3:00 PM Medical Record Number: [id number] Patient Account Number: [id number] Date of Birth/Sex: Treating RN: 03-02-1946 (74 y.o. [name] Other Clinician: Referring Provider: Treating Provider/Extender: Adele Schilder in Treatment: 11 Information Obtained from: Patient Chief Complaint Left foot ulcer Electronic Signature(s) Signed: 04/03/2020 3:53:52 PM By: Worthy Keeler PA-C Entered By: Worthy Keeler on 04/03/2020 15:53:52 -------------------------------------------------------------------------------- Debridement Details Patient Name: Date of Service: [name] Amato RA E. 04/03/2020 3:00 PM Medical Record Number: [id number] Patient Account Number: [id number] Date of Birth/Sex: Treating RN: 08-29-1945 (74 y.o. [name] Other Clinician: Referring Provider: Treating Provider/Extender: Adele Schilder in Treatment: 11 Debridement Performed for Assessment: Wound #1 Left,Medial,Plantar Foot Performed By: Physician Worthy Keeler, PA Debridement Type: Debridement Severity of Tissue Pre Debridement: Fat layer exposed Level of Consciousness (Pre-procedure): Awake and Alert Pre-procedure Verification/Time Out Yes - 16:45 Taken: Start Time: 16:47 Pain Control: Other : benzocaine 20% spray T Area Debrided (L x W): otal 1.5 (cm) x 1.5 (cm) = 2.25 (cm) Tissue and other material debrided: Non-Viable, Callus, Skin: Epidermis Level: Skin/Epidermis Debridement Description: Selective/Open Wound Instrument: Curette Bleeding: None End Time: 16:49 Procedural Pain: 0 Post Procedural Pain: 0 Response to Treatment: Procedure was tolerated well Level of Consciousness (Post- Awake and Alert procedure): Post  Debridement Measurements of Total Wound Length: (cm) 0.9 Width: (cm) 0.9 Depth: (cm) 0.3 Volume: (cm) 0.191 Character of Wound/Ulcer Post Debridement: Improved Severity of Tissue Post Debridement: Fat layer exposed Post Procedure Diagnosis Same as Pre-procedure Electronic Signature(s) Signed: 04/03/2020 6:40:41 PM By: [name] RN, BSN Signed: 04/03/2020 7:06:00 PM By: Worthy Keeler PA-C Entered By: [name] on 04/03/2020 16:49:28 -------------------------------------------------------------------------------- HPI Details Patient Name: Date of Service: [name] [name], [name] RA E. 04/03/2020 3:00 PM Medical Record Number: [id number] Patient Account Number: [id number] Date of Birth/Sex: Treating RN: 1946/01/09 (74 y.o. [name] Other Clinician: Referring Provider: Treating Provider/Extender: Adele Schilder in Treatment: 11 History of Present Illness HPI Description: 01/17/2020 upon evaluation today patient presents for initial evaluation here in our clinic concerning issues she has been having with a left medial/plantar foot ulcer. This is actually been an issue for her since October 2020. She has been seeing Dr. Doran Durand for quite some time during that course. Fortunately there is no signs of active infection at this time. Or least no mention of this to have seen in general. With that being said unfortunately I do see some signs of erythema noted today that does have me concerned about the possibility of infection at this point in the surrounding area of the wound. There is also a warm to touch at the site which is somewhat concerning. Fortunately there is no evidence of systemic infection which is great news. The patient does have a history of diabetes mellitus type 2, Charcot foot which is what led to the wound, and hypertension. She notes that she was in a cast for some time with Dr. Doran Durand for about 8 weeks. During  that time they were utilizing according to the patient silver nitrate along with a foam doughnut and then Coban to secure in place in the cast in place. With that being said I do not have the actual records to review we are going  to try to get a hold of those unfortunately they would not flow over into care everywhere I did look today. She has been seeing Dr. Doran Durand and his physician assistant Larkin Ina up until the end of May and apparently is still seeing them on a regular basis every 2 weeks roughly. She has also tried Iodosorb without effect here. 01/24/2020 upon evaluation today patient actually appears to be doing quite well with regard to her wounds. She has been tolerating the dressing changes without complication. Fortunately there is no signs of active infection spreading which is good news. Her culture did show signs of Staph aureus I did place her on Augmentin due to the erythema surrounding the wound. With that being said the wound does appear to be doing better she has her longer walking cast/boot and I think that is actually good for her for the time being. I am considering reinitiating total contact cast when she gets back from vacation but next week she will actually be out of town at [employer] she knows not to get in the water but she still obviously is planning to enjoy herself she is going to take it easy on her foot however. 02/07/2020 upon evaluation today patient appears to be doing fairly well in regard to her ulcer on her foot. Fortunately there is no signs of severe infection at this time which is great news and overall very pleased in that regard. With that being said I do think that she could still benefit from a total contact cast. Nonetheless she is using her walking boot which at least provide some protection and that it prevents some of the friction occurring when she is ambulating. 02/14/2020 upon evaluation today patient appears to be doing well with regard to her foot ulcer.  This is actually measuring a little bit smaller yet again this week. Overall very pleased with where things stand and I do not see any signs of active infection at this time which is also good news. Since she is measuring better the patient has wanting to somewhat hold off on proceeding with the total contact cast which I think is reasonable at this point. 02/28/2020 on evaluation today patient appears to be doing well in general in regard to her wound although she has a lot of callus buildup as compared to last time I saw her. This is can require sharp debridement today. I do believe she really needs the total contact cast as well which we have discussed previous. 7/23; patient comes in for a total contact cast change 03/06/2020 on evaluation today patient appears to be doing quite well with regard to her wounds. Fortunately the wound bed is measuring smaller and looking much better there is little callus noted although there is some debridement necessary today. 03/13/2020 on evaluation today patient's wound actually appears to be doing excellent which is great news. With that being said unfortunately she is having some issues currently with her left leg where she does have cellulitis it appears. This may have come from an area that rubbed underneath the cast from last week that we noted we padded that area and it looks to be doing excellent at this point but nonetheless the leg was somewhat painful, swollen, and somewhat erythematous. She also had an elevated white blood cell count of 11.5 based on what I saw on looking at her records from the med center in [facility] from where she was seen yesterday. Unfortunately with [place] having a provider on vacation  there was no one here in the clinic in the afternoon when she called therefore she went to the ER as advised. Subsequently they did not cut off the cast as they did not have anyone from orthopedics there to do so and subsequently also did not have the  ability to do the Doppler for evaluation of DVT They recommended therefore given her dose of Eliquis as well as . Augmentin and sent her home to come see [place] today to have the cast taken off and then she is supposed to go back to have the study for DVT performed they are following. 03/20/2020 upon evaluation today patient appears to be doing well with regard to her foot all things considered we have not been able to use the total contact cast due to the infection that she had last week. She has been on the doxycycline and she had a 10-day supply of that I do believe that is helping and her leg appears to be doing better. With that being said there is fortunately no signs of active infection systemically at this time which is good news. No fevers, chills, nausea, vomiting, or diarrhea. 03/27/2020 upon evaluation today patient appears to be doing well with regard to her foot ulcer. There does not appear to be signs of active infection which is great news. Overall I am very pleased with where things stand at this point. 04/03/2020 upon evaluation today patient appears to be doing pretty well in regard to the overall appearance of her wound. Fortunately there is no signs of active infection at this time which is great news. No fevers, chills, nausea, vomiting, or diarrhea. With that being said she does have some blue-green drainage that actually is a little bit concerning to me for the possibility of Pseudomonas. I discussed that with the patient today. With that being said I do believe that we may be able to manage this however with the topical antibiotic cream as opposed to having to do anything oral especially since she seems to be doing so well with overall appearance of the wound. Electronic Signature(s) Signed: 04/03/2020 4:55:37 PM By: Worthy Keeler PA-C Entered By: Worthy Keeler on 04/03/2020 16:55:37 -------------------------------------------------------------------------------- Physical Exam  Details Patient Name: Date of Service: [name] [name], [name] RA E. 04/03/2020 3:00 PM Medical Record Number: [id number] Patient Account Number: [id number] Date of Birth/Sex: Treating RN: April 01, 1946 (74 y.o. [name] Other Clinician: Referring Provider: Treating Provider/Extender: Landis Martins Weeks in Treatment: 61 Constitutional Well-nourished and well-hydrated in no acute distress. Respiratory normal breathing without difficulty. Psychiatric this patient is able to make decisions and demonstrates good insight into disease process. Alert and Oriented x 3. pleasant and cooperative. Notes Was blue-green drainage noted on the patient's dressing consistent with Pseudomonas. With that being said I do not see any signs of systemic infection nor erythema/cellulitis in the foot in general therefore I do not think we need to have oral antibiotic therapy here I would recommend topical gentamicin however. Electronic Signature(s) Signed: 04/03/2020 4:55:57 PM By: Worthy Keeler PA-C Entered By: Worthy Keeler on 04/03/2020 16:55:56 -------------------------------------------------------------------------------- Physician Orders Details Patient Name: Date of Service: [name] [name], [name] RA E. 04/03/2020 3:00 PM Medical Record Number: [id number] Patient Account Number: [id number] Date of Birth/Sex: Treating RN: 11-Apr-1946 (74 y.o. [name] Other Clinician: Referring Provider: Treating Provider/Extender: Adele Schilder in Treatment: [phone] Verbal / Phone Orders: No  Diagnosis Coding ICD-10 Coding Code Description E11.621 Type 2 diabetes mellitus with foot ulcer L97.522 Non-pressure chronic ulcer of other part of left foot with fat layer exposed M14.672 Charcot's joint, left ankle and foot E11.40 Type 2 diabetes mellitus with diabetic neuropathy, unspecified I10 Essential (primary)  hypertension Follow-up Appointments ppointment in 1 week. - with Margarita Grizzle Return A Dressing Change Frequency Wound #1 Left,Medial,Plantar Foot Change Dressing every other day. Skin Barriers/Peri-Wound Care Moisturizing lotion - to foot with dressing changes Wound Cleansing Wound #1 Left,Medial,Plantar Foot May shower and wash wound with soap and water. Primary Wound Dressing Wound #1 Left,Medial,Plantar Foot Silver Collagen - moisten with saline Other: - gentamycin ointment to wound bed under collagen Secondary Dressing Wound #1 Left,Medial,Plantar Foot Foam - donut Kerlix/Rolled Gauze Dry Gauze Drawtex - cut to fit inside wound to hold collagen in place Edema Control Avoid standing for long periods of time Elevate legs to the level of the heart or above for 30 minutes daily and/or when sitting, a frequency of: Off-Loading Open toe surgical shoe to: - left foot Patient Medications llergies: bee venom protein (honey bee), Sulfa (Sulfonamide Antibiotics), Farxiga A Notifications Medication Indication Start End 04/03/2020 gentamicin DOSE topical 0.1 % cream - cream topical applied in a thin film with each dressing change to the wound bed as directed in the clinic every other day Electronic Signature(s) Signed: 04/03/2020 4:58:07 PM By: Worthy Keeler PA-C Entered By: Worthy Keeler on 04/03/2020 16:58:06 -------------------------------------------------------------------------------- Problem List Details Patient Name: Date of Service: [name] Amato RA E. 04/03/2020 3:00 PM Medical Record Number: [id number] Patient Account Number: [id number] Date of Birth/Sex: Treating RN: 08-17-1945 (74 y.o. [name] Other Clinician: Referring Provider: Treating Provider/Extender: Adele Schilder in Treatment: 11 Active Problems ICD-10 Encounter Code Description Active Date MDM Diagnosis E11.621 Type 2 diabetes mellitus  with foot ulcer 01/17/2020 No Yes L97.522 Non-pressure chronic ulcer of other part of left foot with fat layer exposed 01/17/2020 No Yes M14.672 Charcot's joint, left ankle and foot 01/17/2020 No Yes E11.40 Type 2 diabetes mellitus with diabetic neuropathy, unspecified 01/17/2020 No Yes I10 Essential (primary) hypertension 01/17/2020 No Yes Inactive Problems Resolved Problems Electronic Signature(s) Signed: 04/03/2020 3:53:42 PM By: Worthy Keeler PA-C Entered By: Worthy Keeler on 04/03/2020 15:53:41 -------------------------------------------------------------------------------- Progress Note Details Patient Name: Date of Service: [name] [name], [name] RA E. 04/03/2020 3:00 PM Medical Record Number: [id number] Patient Account Number: [id number] Date of Birth/Sex: Treating RN: [birth year] (74 y.o. [name] Other Clinician: Referring Provider: Treating Provider/Extender: Adele Schilder in Treatment: 11 Subjective Chief Complaint Information obtained from Patient Left foot ulcer History of Present Illness (HPI) 01/17/2020 upon evaluation today patient presents for initial evaluation here in our clinic concerning issues she has been having with a left medial/plantar foot ulcer. This is actually been an issue for her since October 2020. She has been seeing Dr. Doran Durand for quite some time during that course. Fortunately there is no signs of active infection at this time. Or least no mention of this to have seen in general. With that being said unfortunately I do see some signs of erythema noted today that does have me concerned about the possibility of infection at this point in the surrounding area of the wound. There is also a warm to touch at the site which is somewhat concerning. Fortunately there is no evidence of systemic infection which is great news.  The patient does have a history of diabetes mellitus type 2, Charcot foot which is what led  to the wound, and hypertension. She notes that she was in a cast for some time with Dr. Doran Durand for about 8 weeks. During that time they were utilizing according to the patient silver nitrate along with a foam doughnut and then Coban to secure in place in the cast in place. With that being said I do not have the actual records to review we are going to try to get a hold of those unfortunately they would not flow over into care everywhere I did look today. She has been seeing Dr. Doran Durand and his physician assistant Larkin Ina up until the end of May and apparently is still seeing them on a regular basis every 2 weeks roughly. She has also tried Iodosorb without effect here. 01/24/2020 upon evaluation today patient actually appears to be doing quite well with regard to her wounds. She has been tolerating the dressing changes without complication. Fortunately there is no signs of active infection spreading which is good news. Her culture did show signs of Staph aureus I did place her on Augmentin due to the erythema surrounding the wound. With that being said the wound does appear to be doing better she has her longer walking cast/boot and I think that is actually good for her for the time being. I am considering reinitiating total contact cast when she gets back from vacation but next week she will actually be out of town at [employer] she knows not to get in the water but she still obviously is planning to enjoy herself she is going to take it easy on her foot however. 02/07/2020 upon evaluation today patient appears to be doing fairly well in regard to her ulcer on her foot. Fortunately there is no signs of severe infection at this time which is great news and overall very pleased in that regard. With that being said I do think that she could still benefit from a total contact cast. Nonetheless she is using her walking boot which at least provide some protection and that it prevents some of the friction  occurring when she is ambulating. 02/14/2020 upon evaluation today patient appears to be doing well with regard to her foot ulcer. This is actually measuring a little bit smaller yet again this week. Overall very pleased with where things stand and I do not see any signs of active infection at this time which is also good news. Since she is measuring better the patient has wanting to somewhat hold off on proceeding with the total contact cast which I think is reasonable at this point. 02/28/2020 on evaluation today patient appears to be doing well in general in regard to her wound although she has a lot of callus buildup as compared to last time I saw her. This is can require sharp debridement today. I do believe she really needs the total contact cast as well which we have discussed previous. 7/23; patient comes in for a total contact cast change 03/06/2020 on evaluation today patient appears to be doing quite well with regard to her wounds. Fortunately the wound bed is measuring smaller and looking much better there is little callus noted although there is some debridement necessary today. 03/13/2020 on evaluation today patient's wound actually appears to be doing excellent which is great news. With that being said unfortunately she is having some issues currently with her left leg where she does have cellulitis it  appears. This may have come from an area that rubbed underneath the cast from last week that we noted we padded that area and it looks to be doing excellent at this point but nonetheless the leg was somewhat painful, swollen, and somewhat erythematous. She also had an elevated white blood cell count of 11.5 based on what I saw on looking at her records from the med center in [facility] from where she was seen yesterday. Unfortunately with [place] having a provider on vacation there was no one here in the clinic in the afternoon when she called therefore she went to the ER as advised. Subsequently  they did not cut off the cast as they did not have anyone from orthopedics there to do so and subsequently also did not have the ability to do the Doppler for evaluation of DVT They recommended therefore given her dose of Eliquis as well as . Augmentin and sent her home to come see [place] today to have the cast taken off and then she is supposed to go back to have the study for DVT performed they are following. 03/20/2020 upon evaluation today patient appears to be doing well with regard to her foot all things considered we have not been able to use the total contact cast due to the infection that she had last week. She has been on the doxycycline and she had a 10-day supply of that I do believe that is helping and her leg appears to be doing better. With that being said there is fortunately no signs of active infection systemically at this time which is good news. No fevers, chills, nausea, vomiting, or diarrhea. 03/27/2020 upon evaluation today patient appears to be doing well with regard to her foot ulcer. There does not appear to be signs of active infection which is great news. Overall I am very pleased with where things stand at this point. 04/03/2020 upon evaluation today patient appears to be doing pretty well in regard to the overall appearance of her wound. Fortunately there is no signs of active infection at this time which is great news. No fevers, chills, nausea, vomiting, or diarrhea. With that being said she does have some blue-green drainage that actually is a little bit concerning to me for the possibility of Pseudomonas. I discussed that with the patient today. With that being said I do believe that we may be able to manage this however with the topical antibiotic cream as opposed to having to do anything oral especially since she seems to be doing so well with overall appearance of the wound. Objective Constitutional Well-nourished and well-hydrated in no acute distress. Vitals Time  Taken: 4:22 PM, Height: 66 in, Weight: 245 lbs, BMI: 39.5, Temperature: 98.6 F, Pulse: 97 bpm, Respiratory Rate: 18 breaths/min, Blood Pressure: 134/81 mmHg. Respiratory normal breathing without difficulty. Psychiatric this patient is able to make decisions and demonstrates good insight into disease process. Alert and Oriented x 3. pleasant and cooperative. General Notes: Was blue-green drainage noted on the patient's dressing consistent with Pseudomonas. With that being said I do not see any signs of systemic infection nor erythema/cellulitis in the foot in general therefore I do not think we need to have oral antibiotic therapy here I would recommend topical gentamicin however. Integumentary (Hair, Skin) Wound #1 status is Open. Original cause of wound was Gradually Appeared. The wound is located on the [place]. The wound measures 0.9cm length x 0.9cm width x 0.3cm depth; 0.636cm^2 area and 0.191cm^3 volume. There  is Fat Layer (Subcutaneous Tissue) exposed. There is no tunneling or undermining noted. There is a small amount of serosanguineous drainage noted. The wound margin is well defined and not attached to the wound base. There is large (67-100%) red granulation within the wound bed. There is a small (1-33%) amount of necrotic tissue within the wound bed including Adherent Slough. Assessment Active Problems ICD-10 Type 2 diabetes mellitus with foot ulcer Non-pressure chronic ulcer of other part of left foot with fat layer exposed Charcot's joint, left ankle and foot Type 2 diabetes mellitus with diabetic neuropathy, unspecified Essential (primary) hypertension Procedures Wound #1 Pre-procedure diagnosis of Wound #1 is a Diabetic Wound/Ulcer of the Lower Extremity located on the Left,Medial,Plantar Foot .Severity of Tissue Pre Debridement is: Fat layer exposed. There was a Selective/Open Wound Skin/Epidermis Debridement with a total area of 2.25 sq cm performed by  Worthy Keeler, PA. With the following instrument(s): Curette to remove Non-Viable tissue/material. Material removed includes Callus and Skin: Epidermis and after achieving pain control using Other (benzocaine 20% spray). No specimens were taken. A time out was conducted at 16:45, prior to the start of the procedure. There was no bleeding. The procedure was tolerated well with a pain level of 0 throughout and a pain level of 0 following the procedure. Post Debridement Measurements: 0.9cm length x 0.9cm width x 0.3cm depth; 0.191cm^3 volume. Character of Wound/Ulcer Post Debridement is improved. Severity of Tissue Post Debridement is: Fat layer exposed. Post procedure Diagnosis Wound #1: Same as Pre-Procedure Plan Follow-up Appointments: Return Appointment in 1 week. - with Margarita Grizzle Dressing Change Frequency: Wound #1 Left,Medial,Plantar Foot: Change Dressing every other day. Skin Barriers/Peri-Wound Care: Moisturizing lotion - to foot with dressing changes Wound Cleansing: Wound #1 Left,Medial,Plantar Foot: May shower and wash wound with soap and water. Primary Wound Dressing: Wound #1 Left,Medial,Plantar Foot: Silver Collagen - moisten with saline Other: - gentamycin ointment to wound bed under collagen Secondary Dressing: Wound #1 Left,Medial,Plantar Foot: Foam - donut Kerlix/Rolled Gauze Dry Gauze Drawtex - cut to fit inside wound to hold collagen in place Edema Control: Avoid standing for long periods of time Elevate legs to the level of the heart or above for 30 minutes daily and/or when sitting, a frequency of: Off-Loading: Open toe surgical shoe to: - left foot The following medication(s) was prescribed: gentamicin topical 0.1 % cream cream topical applied in a thin film with each dressing change to the wound bed as directed in the clinic every other day starting 04/03/2020 1. I would recommend currently we go ahead and use topical gentamicin to actually moisten the collagen  with at this point I think that it would do well as far as trying to keep the infection under control and hopefully calm down this drainage as well. 2. She will continue with the Dr. Felicie Morn callus/corn pads which seem to be doing a great job. 3. I am also can recommend she continue with the open toe surgical shoe is much as she can although she is also using her shoes on as well. Either way she seems to be doing quite well in my opinion. We will see patient back for reevaluation in 1 week here in the clinic. If anything worsens or changes patient will contact our office for additional recommendations. Electronic Signature(s) Signed: 04/03/2020 4:58:22 PM By: Worthy Keeler PA-C Previous Signature: 04/03/2020 4:56:40 PM Version By: Worthy Keeler PA-C Entered By: Worthy Keeler on 04/03/2020 16:58:22 -------------------------------------------------------------------------------- SuperBill Details Patient Name: Date of Service:  [name] [name], [name] RA E. 04/03/2020 Medical Record Number: [id number] Patient Account Number: [id number] Date of Birth/Sex: Treating RN: 03-25-1946 (74 y.o. [name] Other Clinician: Referring Provider: Treating Provider/Extender: Adele Schilder in Treatment: 11 Diagnosis Coding ICD-10 Codes Code Description E11.621 Type 2 diabetes mellitus with foot ulcer L97.522 Non-pressure chronic ulcer of other part of left foot with fat layer exposed M14.672 Charcot's joint, left ankle and foot E11.40 Type 2 diabetes mellitus with diabetic neuropathy, unspecified I10 Essential (primary) hypertension Facility Procedures CPT4 Code: [id number] IC L Description: 83094 - DEBRIDE WOUND 1ST 20 SQ CM OR < D-10 Diagnosis Description 97.522 Non-pressure chronic ulcer of other part of left foot with fat layer exposed Modifier: Quantity: 1 Physician Procedures : CPT4 Code Description Modifier [id number] - WC PHYS LEVEL  4 - EST PT 25 ICD-10 Diagnosis Description E11.621 Type 2 diabetes mellitus with foot ulcer L97.522 Non-pressure chronic ulcer of other part of left foot with fat layer exposed M14.672  Charcot's joint, left ankle and foot E11.40 Type 2 diabetes mellitus with diabetic neuropathy, unspecified Quantity: 1 : [id number] - WC PHYS DEBR WO ANESTH 20 SQ CM ICD-10 Diagnosis Description L97.522 Non-pressure chronic ulcer of other part of left foot with fat layer exposed Quantity: 1 Electronic Signature(s) Signed: 04/03/2020 4:59:46 PM By: Worthy Keeler PA-C Entered By: Worthy Keeler on 04/03/2020 16:59:46

## 2020-04-04 NOTE — Progress Notes (Signed)
Subjective:    Patient ID: [name] [name], female    DOB: [birth year], 74 y.o.   MRN: [id number]  HPI The patient is here for follow up of their chronic medical problems, including DM, htn, hyperlipidemia  a1c here today is 7.6%.  She is doing the best she can do with watching the sugars and carbohydrates.  Has has a pseudomonas infection in her left foot.  [facility] She is using a topical gentamicin cream for treatment.  She sees them again next week.  She still has dec smell/taste.  She often does not feel like eating certain foods - it make her sick to think about it.  She can not eat meat or chicken.  She is struggling to eat less carbs and more protein.  She gets nauseous frequently.  Since covid she has more hiccups - typically after eating or drinking. She may hiccup 6-8 times.     Sugar yesterday 140.  Sometimes 160  She is not able to exercise because of her foot infection.  Medications and allergies reviewed with patient and updated if appropriate.  Patient Active Problem List   Diagnosis Date Noted  . Fatigue 11/13/2019  . Dyslipidemia 11/12/2019  . Pityriasis lichenoides chronica 09/18/2019  . Nausea 09/18/2019  . Insomnia 08/07/2019  . Anxiety 08/07/2019  . Psoriatic arthritis ([place]) 08/07/2019  . H/O: gout 08/07/2019  . Recurrent UTI 08/07/2019  . Charcot's joint of left foot 08/07/2019  . Depression 08/07/2019  . Pneumonia due to COVID-19 virus 07/09/2019  . Physical deconditioning 07/08/2019  . COVID-19 07/07/2019  . Diabetic peripheral neuropathy ([place]) 04/10/2019  . Pain in left knee 10/13/2018  . Ischemic colitis ([place]) 10/06/2018  . Arthritis 07/21/2018  . Urinary incontinence 07/21/2018  . Diabetic foot ulcer ([place]) 07/13/2018  . Cellulitis of toe of left foot 05/02/2018  . DM2 (diabetes mellitus, type 2) ([place]) 05/02/2018  . Psoriasis 05/02/2018  . Osteoarthritis of knee 10/18/2017  . Chest pain 01/12/2015  . Right  bundle branch block 05/20/2014  . OSA (obstructive sleep apnea), BiPAP 03/12/2013  . Essential hypertension 03/12/2013  . Obesity 09/14/2012  . Hyponatremia 09/14/2012  . S/P right THA, AA 09/13/2012  . Deep venous thrombosis of lower extremity ([place]) 08/10/2010  . 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 acetaminophen (TYLENOL) 325 MG tablet Take 2 tablets (650 mg total) by mouth every 6 (six) hours as needed for mild pain or headache.    . allopurinol (ZYLOPRIM) 100 MG tablet Take 100 mg by mouth 2 (two) times daily.    Marland Kitchen ALPRAZolam (XANAX) 0.25 MG tablet Take 1 tablet (0.25 mg total) by mouth 2 (two) times daily as needed for anxiety. 20 tablet 0  . aspirin 81 MG tablet Take 81 mg by mouth every other day.     Marland Kitchen azelastine (ASTELIN) 0.1 % nasal spray Place 2 sprays into both nostrils 2 (two) times daily. Use in each nostril as directed 30 mL 12  . Budesonide (UCERIS) 9 MG TB24 Take 9 mg by mouth daily.     . Certolizumab Pegol (CIMZIA [place]) Inject 400 mg into the skin every 30 (thirty) days. 200 mg on each side of stomach    . Cholecalciferol (VITAMIN D3) 1000 UNITS CAPS Take 1,000 Units by mouth 2 (two) times daily.     . citalopram (CELEXA) 40 MG tablet Take 40 mg  by mouth daily.    . Cyanocobalamin (VITAMIN B-12) 1000 MCG SUBL Place 1,000 mcg under the tongue daily.    . cyclobenzaprine (FLEXERIL) 10 MG tablet TAKE 1 TABLET BY MOUTH AT BEDTIME 90 tablet 0  . docusate sodium (COLACE) 100 MG capsule Take 1 capsule (100 mg total) by mouth 2 (two) times daily. 60 capsule 5  . doxycycline (VIBRA-TABS) 100 MG tablet Take 1 tablet (100 mg total) by mouth 2 (two) times daily. 20 tablet 0  . fluticasone (FLONASE) 50 MCG/ACT nasal spray Place 2 sprays into both nostrils daily. 16 g 6  . folic acid (FOLVITE) 1 MG tablet Take 1 tablet (1 mg total) by mouth daily. 90 tablet 1  . Insulin Pen Needle (B-D UF  III MINI PEN NEEDLES) 31G X 5 MM MISC Use daily for insulin pen 90 each 3  . LEVEMIR FLEXTOUCH 100 UNIT/ML FlexPen ADMINISTER 14 UNITS UNDER THE SKIN TWICE DAILY 15 mL 2  . lisinopril-hydrochlorothiazide (ZESTORETIC) 20-25 MG tablet Take 1 tablet by mouth daily.    Marland Kitchen loperamide (IMODIUM) 2 MG capsule Take 6 mg by mouth 2 (two) times daily.    . Melatonin 5 MG SUBL Place 5 mg under the tongue at bedtime.    . metFORMIN (GLUCOPHAGE-XR) 500 MG 24 hr tablet Take 2 tablets (1,000 mg total) by mouth 2 (two) times daily. 360 tablet 1  . mupirocin cream (BACTROBAN) 2 % Apply 1 application topically 2 (two) times daily as needed (psoriasis).     Marland Kitchen omeprazole (PRILOSEC) 40 MG capsule Take 40 mg by mouth at bedtime.    . potassium chloride (KLOR-CON) 10 MEQ tablet Take 2 tablets (20 mEq total) by mouth daily. 180 tablet 1  . Probiotic Product (ALIGN PO) Take 1 capsule by mouth daily.    . rosuvastatin (CRESTOR) 5 MG tablet Take 1 tablet (5 mg total) by mouth daily. 90 tablet 3  . terbinafine (LAMISIL) 250 MG tablet Take 1 tablet (250 mg total) by mouth daily.    Marland Kitchen triamcinolone cream (KENALOG) 0.1 % Apply 1 application topically 2 (two) times daily. 30 g 0   No current facility-administered medications on file prior to visit.    Past Medical History:  Diagnosis Date  . Arthritis   . Closed fracture of fifth metatarsal bone 05/13/2018  . Deep vein thrombosis ([place])    right calf - 05/2012   . Diabetes mellitus without complication (HCC)    diet controlled   . GERD (gastroesophageal reflux disease)   . Hyperlipidemia   . Hypertension    Ejection fraction =>55% Left ventricular systolic function is normal. Left ventricular wall motion is normal    . Lymphocytic colitis   . Neuropathy    diabetic - in bilateral feet   . Pityriasis lichenoides chronica   . Sleep apnea    bipap    Past Surgical History:  Procedure Laterality Date  . DILATION AND CURETTAGE OF UTERUS    . HAMMER TOE SURGERY    .  right hand surgery      due to blood infection   . torn meniscus repair      right knee   . TOTAL HIP ARTHROPLASTY  09/13/2012   Procedure: TOTAL HIP ARTHROPLASTY ANTERIOR APPROACH;  Surgeon: Mauri Pole, MD;  Location: [facility];  Service: Orthopedics;  Laterality: Right;    Social History   Socioeconomic History  . Marital status: Divorced    Spouse name: Not on file  . Number  of children: Not on file  . Years of education: Not on file  . Highest education level: Not on file  Occupational History  . Not on file  Tobacco Use  . Smoking status: Never Smoker  . Smokeless tobacco: Never Used  Substance and Sexual Activity  . Alcohol use: Yes    Comment: occasional wine   . Drug use: No  . Sexual activity: Not on file  Other Topics Concern  . Not on file  Social History Narrative   She is widowed. She has 2 children, 2 grandchildren. She does drink occasional alcohol . There is no tobacco . She does not have a routine exercise program   Social Determinants of Health   Financial Resource Strain:   . Difficulty of Paying Living Expenses: Not on file  Food Insecurity:   . Worried About Charity fundraiser in the Last Year: Not on file  . Ran Out of Food in the Last Year: Not on file  Transportation Needs:   . Lack of Transportation (Medical): Not on file  . Lack of Transportation (Non-Medical): Not on file  Physical Activity:   . Days of Exercise per Week: Not on file  . Minutes of Exercise per Session: Not on file  Stress:   . Feeling of Stress : Not on file  Social Connections:   . Frequency of Communication with Friends and Family: Not on file  . Frequency of Social Gatherings with Friends and Family: Not on file  . Attends Religious Services: Not on file  . Active Member of Clubs or Organizations: Not on file  . Attends Archivist Meetings: Not on file  . Marital Status: Not on file    Family History  Problem Relation Age of Onset  . Hypertension Mother      Review of Systems  Constitutional: Negative for fever.  HENT: Positive for trouble swallowing.   Respiratory: Positive for shortness of breath (little due to inactivity). Negative for cough and wheezing.   Cardiovascular: Positive for leg swelling. Negative for chest pain and palpitations.  Gastrointestinal: Positive for abdominal pain (occ lower abdomen), constipation (occ) and nausea. Negative for blood in stool and diarrhea.       Having some dry heaves.  No GERD  Neurological: Negative for light-headedness and headaches.       Objective:   Vitals:   04/05/20 1024  BP: 138/78  Pulse: (!) 104  Temp: 98.3 F (36.8 C)  SpO2: 96%   BP Readings from Last 3 Encounters:  04/05/20 138/78  03/12/20 (!) 143/79  01/12/20 128/75   Wt Readings from Last 3 Encounters:  03/12/20 245 lb (111.1 kg)  01/12/20 245 lb (111.1 kg)  07/13/19 268 lb 15.4 oz (122 kg)   Body mass index is 39.54 kg/m.   Physical Exam    Constitutional: Appears well-developed and well-nourished. No distress.  HENT:  Head: Normocephalic and atraumatic.  Neck: Neck supple. No tracheal deviation present. No thyromegaly present.  No cervical lymphadenopathy Cardiovascular: Normal rate, regular rhythm and normal heart sounds.   No murmur heard. No carotid bruit .  Left lower extremity edema, no right lower extremity edema Pulmonary/Chest: Effort normal and breath sounds normal. No respiratory distress. No has no wheezes. No rales.  Skin: Skin is warm and dry. Not diaphoretic.  Psychiatric: Normal mood and affect. Behavior is normal.      Assessment & Plan:    See Problem List for Assessment and Plan of chronic medical  problems.    This visit occurred during the SARS-CoV-2 public health emergency.  Safety protocols were in place, including screening questions prior to the visit, additional usage of staff PPE, and extensive cleaning of exam room while observing appropriate contact time as indicated for  disinfecting solutions.

## 2020-04-04 NOTE — Patient Instructions (Addendum)
  Your a1c is 7.6   You can try zinc supplementation - take 25 mg a day. (you should take less than 40 mg a day).   Medications reviewed and updated.  Changes include :   Increase insulin 20 units twice a day.   Your prescription(s) have been submitted to your pharmacy. Please take as directed and contact our office if you believe you are having problem(s) with the medication(s).  Follow up with GI.     Please followup in 6 months   Booster shots for immunocompromised people will be offered at all [facility] clinics, with locations and schedules available at [email or web address] Those eligible should schedule a third-dose booster vaccination appointment by visiting [email or web address] or calling [phone] Monday through Friday between 7 a.m. and 7 p.m.

## 2020-04-05 ENCOUNTER — Encounter: Payer: Self-pay | Admitting: Internal Medicine

## 2020-04-05 ENCOUNTER — Other Ambulatory Visit: Payer: Self-pay

## 2020-04-05 ENCOUNTER — Ambulatory Visit (INDEPENDENT_AMBULATORY_CARE_PROVIDER_SITE_OTHER): Payer: Medicare Other | Admitting: Internal Medicine

## 2020-04-05 ENCOUNTER — Other Ambulatory Visit: Payer: Self-pay | Admitting: Internal Medicine

## 2020-04-05 VITALS — BP 138/78 | HR 104 | Temp 98.3°F | Ht 66.0 in

## 2020-04-05 DIAGNOSIS — E785 Hyperlipidemia, unspecified: Secondary | ICD-10-CM | POA: Diagnosis not present

## 2020-04-05 DIAGNOSIS — E1169 Type 2 diabetes mellitus with other specified complication: Secondary | ICD-10-CM

## 2020-04-05 DIAGNOSIS — I10 Essential (primary) hypertension: Secondary | ICD-10-CM

## 2020-04-05 DIAGNOSIS — R110 Nausea: Secondary | ICD-10-CM | POA: Diagnosis not present

## 2020-04-05 LAB — POCT GLYCOSYLATED HEMOGLOBIN (HGB A1C): Hemoglobin A1C: 7.6 % — AB (ref 4.0–5.6)

## 2020-04-05 MED ORDER — LEVEMIR FLEXTOUCH 100 UNIT/ML ~~LOC~~ SOPN: PEN_INJECTOR | SUBCUTANEOUS | 2 refills | Status: DC

## 2020-04-05 MED ORDER — ROSUVASTATIN CALCIUM 5 MG PO TABS: 5.0000 mg | ORAL_TABLET | Freq: Every day | ORAL | 3 refills | Status: DC

## 2020-04-05 NOTE — Assessment & Plan Note (Addendum)
Chronic Lipids well controlled Continue daily statin Regular exercise and healthy diet encouraged.  Once able she will start to be more active.

## 2020-04-05 NOTE — Assessment & Plan Note (Signed)
Chronic BP well controlled Current regimen effective and well tolerated Continue current medications at current doses  

## 2020-04-05 NOTE — Assessment & Plan Note (Signed)
Chronic A1c today here in the office improved at 7.6% Sugars in the morning 140, 160 Advised her to take her sugar at different times of the day Will increase Levemir to 20 units twice daily Continue Metformin Hopefully if we can straighten out her stomach and taste she will be able to eat differently, which may help. Hopefully once her infection in the foot is better she can ambulate more Follow-up in 6 months

## 2020-04-05 NOTE — Assessment & Plan Note (Signed)
[facility]acute Started after Covid, but wonder if some of her GI symptoms need to be evaluated further by Dr. Leamon Arnt will make an appointment She does take the omeprazole daily

## 2020-04-06 ENCOUNTER — Encounter: Payer: Self-pay | Admitting: Internal Medicine

## 2020-04-08 ENCOUNTER — Ambulatory Visit: Payer: Medicare Other

## 2020-04-08 DIAGNOSIS — E669 Obesity, unspecified: Secondary | ICD-10-CM | POA: Diagnosis not present

## 2020-04-08 DIAGNOSIS — L408 Other psoriasis: Secondary | ICD-10-CM | POA: Diagnosis not present

## 2020-04-08 DIAGNOSIS — Z6839 Body mass index (BMI) 39.0-39.9, adult: Secondary | ICD-10-CM | POA: Diagnosis not present

## 2020-04-08 DIAGNOSIS — Z79899 Other long term (current) drug therapy: Secondary | ICD-10-CM | POA: Diagnosis not present

## 2020-04-08 DIAGNOSIS — M2550 Pain in unspecified joint: Secondary | ICD-10-CM | POA: Diagnosis not present

## 2020-04-08 DIAGNOSIS — K5289 Other specified noninfective gastroenteritis and colitis: Secondary | ICD-10-CM | POA: Diagnosis not present

## 2020-04-08 DIAGNOSIS — M1009 Idiopathic gout, multiple sites: Secondary | ICD-10-CM | POA: Diagnosis not present

## 2020-04-08 DIAGNOSIS — L4059 Other psoriatic arthropathy: Secondary | ICD-10-CM | POA: Diagnosis not present

## 2020-04-09 ENCOUNTER — Ambulatory Visit: Payer: Self-pay | Attending: Internal Medicine

## 2020-04-09 ENCOUNTER — Telehealth: Payer: Self-pay | Admitting: Cardiovascular Disease

## 2020-04-09 DIAGNOSIS — Z23 Encounter for immunization: Secondary | ICD-10-CM

## 2020-04-09 NOTE — Telephone Encounter (Signed)
Patient states she would like to discuss her sleep machine being recalled.

## 2020-04-09 NOTE — Progress Notes (Signed)
   Covid-19 Vaccination Clinic  Name:  [name] [name]    MRN: [id number] DOB: Jun 14, 1946  04/09/2020  [name] was observed post Covid-19 immunization for 15 minutes without incident. She was provided with Vaccine Information Sheet and instruction to access the V-Safe system.   [name] was instructed to call 911 with any severe reactions post vaccine: Marland Kitchen Difficulty breathing  . Swelling of face and throat  . A fast heartbeat  . A bad rash all over body  . Dizziness and weakness

## 2020-04-10 ENCOUNTER — Other Ambulatory Visit: Payer: Self-pay

## 2020-04-10 ENCOUNTER — Encounter (HOSPITAL_BASED_OUTPATIENT_CLINIC_OR_DEPARTMENT_OTHER): Payer: Medicare Other | Attending: Physician Assistant | Admitting: Physician Assistant

## 2020-04-10 DIAGNOSIS — M109 Gout, unspecified: Secondary | ICD-10-CM | POA: Diagnosis not present

## 2020-04-10 DIAGNOSIS — I890 Lymphedema, not elsewhere classified: Secondary | ICD-10-CM | POA: Insufficient documentation

## 2020-04-10 DIAGNOSIS — M1990 Unspecified osteoarthritis, unspecified site: Secondary | ICD-10-CM | POA: Insufficient documentation

## 2020-04-10 DIAGNOSIS — E11621 Type 2 diabetes mellitus with foot ulcer: Secondary | ICD-10-CM | POA: Diagnosis not present

## 2020-04-10 DIAGNOSIS — I10 Essential (primary) hypertension: Secondary | ICD-10-CM | POA: Insufficient documentation

## 2020-04-10 DIAGNOSIS — L97522 Non-pressure chronic ulcer of other part of left foot with fat layer exposed: Secondary | ICD-10-CM | POA: Insufficient documentation

## 2020-04-10 DIAGNOSIS — G4730 Sleep apnea, unspecified: Secondary | ICD-10-CM | POA: Diagnosis not present

## 2020-04-10 DIAGNOSIS — E1140 Type 2 diabetes mellitus with diabetic neuropathy, unspecified: Secondary | ICD-10-CM | POA: Insufficient documentation

## 2020-04-10 DIAGNOSIS — K529 Noninfective gastroenteritis and colitis, unspecified: Secondary | ICD-10-CM | POA: Diagnosis not present

## 2020-04-10 NOTE — Progress Notes (Addendum)
[name] [name] ([id number]) Visit Report for 04/10/2020 Chief Complaint Document Details Patient Name: Date of Service: [name] [name], [name] E. 04/10/2020 1:30 PM Medical Record Number: [id number] Patient Account Number: [id number] Date of Birth/Sex: Treating RN: 1946/02/27 (74 y.o. [name] [name] Primary Care Provider: [name] Other Clinician: Referring Provider: Treating Provider/Extender: Adele Schilder in Treatment: 12 Information Obtained from: Patient Chief Complaint Left foot ulcer Electronic Signature(s) Signed: 04/10/2020 1:57:56 PM By: Worthy Keeler PA-C Entered By: Worthy Keeler on 04/10/2020 13:57:56 -------------------------------------------------------------------------------- HPI Details Patient Name: Date of Service: [name] [name], [name] RA E. 04/10/2020 1:30 PM Medical Record Number: [id number] Patient Account Number: [id number] Date of Birth/Sex: Treating RN: 03-Feb-1946 (74 y.o. [name] [name] Primary Care Provider: [name] Other Clinician: Referring Provider: Treating Provider/Extender: Adele Schilder in Treatment: 12 History of Present Illness HPI Description: 01/17/2020 upon evaluation today patient presents for initial evaluation here in our clinic concerning issues she has been having with a left medial/plantar foot ulcer. This is actually been an issue for her since October 2020. She has been seeing Dr. Doran Durand for quite some time during that course. Fortunately there is no signs of active infection at this time. Or least no mention of this to have seen in general. With that being said unfortunately I do see some signs of erythema noted today that does have me concerned about the possibility of infection at this point in the surrounding area of the wound. There is also a warm to touch at the site which is somewhat concerning. Fortunately there is no evidence of systemic infection which is great news. The patient does have  a history of diabetes mellitus type 2, Charcot foot which is what led to the wound, and hypertension. She notes that she was in a cast for some time with Dr. Doran Durand for about 8 weeks. During that time they were utilizing according to the patient silver nitrate along with a foam doughnut and then Coban to secure in place in the cast in place. With that being said I do not have the actual records to review we are going to try to get a hold of those unfortunately they would not flow over into care everywhere I did look today. She has been seeing Dr. Doran Durand and his physician assistant Larkin Ina up until the end of May and apparently is still seeing them on a regular basis every 2 weeks roughly. She has also tried Iodosorb without effect here. 01/24/2020 upon evaluation today patient actually appears to be doing quite well with regard to her wounds. She has been tolerating the dressing changes without complication. Fortunately there is no signs of active infection spreading which is good news. Her culture did show signs of Staph aureus I did place her on Augmentin due to the erythema surrounding the wound. With that being said the wound does appear to be doing better she has her longer walking cast/boot and I think that is actually good for her for the time being. I am considering reinitiating total contact cast when she gets back from vacation but next week she will actually be out of town at [employer] she knows not to get in the water but she still obviously is planning to enjoy herself she is going to take it easy on her foot however. 02/07/2020 upon evaluation today patient appears to be doing fairly well in regard to her ulcer on her foot. Fortunately there is no signs of  severe infection at this time which is great news and overall very pleased in that regard. With that being said I do think that she could still benefit from a total contact cast. Nonetheless she is using her walking boot which at least  provide some protection and that it prevents some of the friction occurring when she is ambulating. 02/14/2020 upon evaluation today patient appears to be doing well with regard to her foot ulcer. This is actually measuring a little bit smaller yet again this week. Overall very pleased with where things stand and I do not see any signs of active infection at this time which is also good news. Since she is measuring better the patient has wanting to somewhat hold off on proceeding with the total contact cast which I think is reasonable at this point. 02/28/2020 on evaluation today patient appears to be doing well in general in regard to her wound although she has a lot of callus buildup as compared to last time I saw her. This is can require sharp debridement today. I do believe she really needs the total contact cast as well which we have discussed previous. 7/23; patient comes in for a total contact cast change 03/06/2020 on evaluation today patient appears to be doing quite well with regard to her wounds. Fortunately the wound bed is measuring smaller and looking much better there is little callus noted although there is some debridement necessary today. 03/13/2020 on evaluation today patient's wound actually appears to be doing excellent which is great news. With that being said unfortunately she is having some issues currently with her left leg where she does have cellulitis it appears. This may have come from an area that rubbed underneath the cast from last week that we noted we padded that area and it looks to be doing excellent at this point but nonetheless the leg was somewhat painful, swollen, and somewhat erythematous. She also had an elevated white blood cell count of 11.5 based on what I saw on looking at her records from the med center in [facility] from where she was seen yesterday. Unfortunately with [place] having a provider on vacation there was no one here in the clinic in the afternoon when she  called therefore she went to the ER as advised. Subsequently they did not cut off the cast as they did not have anyone from orthopedics there to do so and subsequently also did not have the ability to do the Doppler for evaluation of DVT They recommended therefore given her dose of Eliquis as well as . Augmentin and sent her home to come see [place] today to have the cast taken off and then she is supposed to go back to have the study for DVT performed they are following. 03/20/2020 upon evaluation today patient appears to be doing well with regard to her foot all things considered we have not been able to use the total contact cast due to the infection that she had last week. She has been on the doxycycline and she had a 10-day supply of that I do believe that is helping and her leg appears to be doing better. With that being said there is fortunately no signs of active infection systemically at this time which is good news. No fevers, chills, nausea, vomiting, or diarrhea. 03/27/2020 upon evaluation today patient appears to be doing well with regard to her foot ulcer. There does not appear to be signs of active infection which is great news. Overall I  am very pleased with where things stand at this point. 04/03/2020 upon evaluation today patient appears to be doing pretty well in regard to the overall appearance of her wound. Fortunately there is no signs of active infection at this time which is great news. No fevers, chills, nausea, vomiting, or diarrhea. With that being said she does have some blue-green drainage that actually is a little bit concerning to me for the possibility of Pseudomonas. I discussed that with the patient today. With that being said I do believe that we may be able to manage this however with the topical antibiotic cream as opposed to having to do anything oral especially since she seems to be doing so well with overall appearance of the wound. 04/10/2020 on evaluation today  patient appears to be doing about the same roughly in regard to the overall size of her wound. With that being said she fortunately has not shown any signs of worsening overall which is good news. I do believe that she is doing a great job trying to offload but again she may still do better with the cast. I do not see in the blue-green drainage that we noticed previously I do believe the gentamicin help in this regard. Electronic Signature(s) Signed: 04/10/2020 2:51:11 PM By: Worthy Keeler PA-C Entered By: Worthy Keeler on 04/10/2020 14:51:11 -------------------------------------------------------------------------------- Physical Exam Details Patient Name: Date of Service: [name] [name], [name] RA E. 04/10/2020 1:30 PM Medical Record Number: [id number] Patient Account Number: [id number] Date of Birth/Sex: Treating RN: 09/04/1945 (74 y.o. [name] [name] Primary Care Provider: [name] Other Clinician: Referring Provider: Treating Provider/Extender: Landis Martins Weeks in Treatment: 46 Constitutional Well-nourished and well-hydrated in no acute distress. Respiratory normal breathing without difficulty. Psychiatric this patient is able to make decisions and demonstrates good insight into disease process. Alert and Oriented x 3. pleasant and cooperative. Notes Upon inspection patient's wound bed actually showed signs of good granulation at this time there does not appear to be any evidence of active infection which is great news overall very pleased with where things stand. The patient is likewise extremely happy to hear that things are doing so well. Electronic Signature(s) Signed: 04/10/2020 2:51:32 PM By: Worthy Keeler PA-C Entered By: Worthy Keeler on 04/10/2020 14:51:32 -------------------------------------------------------------------------------- Physician Orders Details Patient Name: Date of Service: [name] [name], [name] RA E. 04/10/2020 1:30 PM Medical Record Number:  [id number] Patient Account Number: [id number] Date of Birth/Sex: Treating RN: [birth year] (74 y.o. [name] [name] Primary Care Provider: [name] Other Clinician: Referring Provider: Treating Provider/Extender: Adele Schilder in Treatment: 12 Verbal / Phone Orders: No Diagnosis Coding ICD-10 Coding Code Description E11.621 Type 2 diabetes mellitus with foot ulcer L97.522 Non-pressure chronic ulcer of other part of left foot with fat layer exposed M14.672 Charcot's joint, left ankle and foot E11.40 Type 2 diabetes mellitus with diabetic neuropathy, unspecified I10 Essential (primary) hypertension Follow-up Appointments ppointment in 1 week. - with Margarita Grizzle Return A Dressing Change Frequency Wound #1 Left,Medial,Plantar Foot Change Dressing every other day. Skin Barriers/Peri-Wound Care Moisturizing lotion - to foot with dressing changes Wound Cleansing Wound #1 Left,Medial,Plantar Foot May shower and wash wound with soap and water. Primary Wound Dressing Wound #1 Left,Medial,Plantar Foot Silver Collagen - moisten with saline Other: - gentamycin ointment to wound bed under collagen Secondary Dressing Wound #1 Left,Medial,Plantar Foot Foam - donut Kerlix/Rolled Gauze Dry Gauze Drawtex - cut to fit inside wound to hold collagen in place Edema  Control Avoid standing for long periods of time Elevate legs to the level of the heart or above for 30 minutes daily and/or when sitting, a frequency of: Off-Loading Open toe surgical shoe to: - left foot Electronic Signature(s) Signed: 04/10/2020 6:44:49 PM By: [name] RN, BSN Signed: 04/13/2020 11:53:24 AM By: Worthy Keeler PA-C Entered By: [name] on 04/10/2020 14:46:29 -------------------------------------------------------------------------------- Problem List Details Patient Name: Date of Service: [name] Guarneri RBA RA E. 04/10/2020 1:30 PM Medical Record Number: [id number] Patient Account  Number: [id number] Date of Birth/Sex: Treating RN: Jan 01, 1946 (74 y.o. [name] [name] Primary Care Provider: [name] Other Clinician: Referring Provider: Treating Provider/Extender: Adele Schilder in Treatment: 12 Active Problems ICD-10 Encounter Code Description Active Date MDM Diagnosis E11.621 Type 2 diabetes mellitus with foot ulcer 01/17/2020 No Yes L97.522 Non-pressure chronic ulcer of other part of left foot with fat layer exposed 01/17/2020 No Yes M14.672 Charcot's joint, left ankle and foot 01/17/2020 No Yes E11.40 Type 2 diabetes mellitus with diabetic neuropathy, unspecified 01/17/2020 No Yes I10 Essential (primary) hypertension 01/17/2020 No Yes Inactive Problems Resolved Problems Electronic Signature(s) Signed: 04/10/2020 1:57:38 PM By: Worthy Keeler PA-C Entered By: Worthy Keeler on 04/10/2020 13:57:38 -------------------------------------------------------------------------------- Progress Note Details Patient Name: Date of Service: [name] [name], [name] RA E. 04/10/2020 1:30 PM Medical Record Number: [id number] Patient Account Number: [id number] Date of Birth/Sex: Treating RN: 1945-08-26 (74 y.o. [name] [name] Primary Care Provider: [name] Other Clinician: Referring Provider: Treating Provider/Extender: Adele Schilder in Treatment: 12 Subjective Chief Complaint Information obtained from Patient Left foot ulcer History of Present Illness (HPI) 01/17/2020 upon evaluation today patient presents for initial evaluation here in our clinic concerning issues she has been having with a left medial/plantar foot ulcer. This is actually been an issue for her since October 2020. She has been seeing Dr. Doran Durand for quite some time during that course. Fortunately there is no signs of active infection at this time. Or least no mention of this to have seen in general. With that being said unfortunately I do see some signs of  erythema noted today that does have me concerned about the possibility of infection at this point in the surrounding area of the wound. There is also a warm to touch at the site which is somewhat concerning. Fortunately there is no evidence of systemic infection which is great news. The patient does have a history of diabetes mellitus type 2, Charcot foot which is what led to the wound, and hypertension. She notes that she was in a cast for some time with Dr. Doran Durand for about 8 weeks. During that time they were utilizing according to the patient silver nitrate along with a foam doughnut and then Coban to secure in place in the cast in place. With that being said I do not have the actual records to review we are going to try to get a hold of those unfortunately they would not flow over into care everywhere I did look today. She has been seeing Dr. Doran Durand and his physician assistant Larkin Ina up until the end of May and apparently is still seeing them on a regular basis every 2 weeks roughly. She has also tried Iodosorb without effect here. 01/24/2020 upon evaluation today patient actually appears to be doing quite well with regard to her wounds. She has been tolerating the dressing changes without complication. Fortunately there is no signs of active infection spreading which is good news. Her culture  did show signs of Staph aureus I did place her on Augmentin due to the erythema surrounding the wound. With that being said the wound does appear to be doing better she has her longer walking cast/boot and I think that is actually good for her for the time being. I am considering reinitiating total contact cast when she gets back from vacation but next week she will actually be out of town at [employer] she knows not to get in the water but she still obviously is planning to enjoy herself she is going to take it easy on her foot however. 02/07/2020 upon evaluation today patient appears to be doing fairly well in  regard to her ulcer on her foot. Fortunately there is no signs of severe infection at this time which is great news and overall very pleased in that regard. With that being said I do think that she could still benefit from a total contact cast. Nonetheless she is using her walking boot which at least provide some protection and that it prevents some of the friction occurring when she is ambulating. 02/14/2020 upon evaluation today patient appears to be doing well with regard to her foot ulcer. This is actually measuring a little bit smaller yet again this week. Overall very pleased with where things stand and I do not see any signs of active infection at this time which is also good news. Since she is measuring better the patient has wanting to somewhat hold off on proceeding with the total contact cast which I think is reasonable at this point. 02/28/2020 on evaluation today patient appears to be doing well in general in regard to her wound although she has a lot of callus buildup as compared to last time I saw her. This is can require sharp debridement today. I do believe she really needs the total contact cast as well which we have discussed previous. 7/23; patient comes in for a total contact cast change 03/06/2020 on evaluation today patient appears to be doing quite well with regard to her wounds. Fortunately the wound bed is measuring smaller and looking much better there is little callus noted although there is some debridement necessary today. 03/13/2020 on evaluation today patient's wound actually appears to be doing excellent which is great news. With that being said unfortunately she is having some issues currently with her left leg where she does have cellulitis it appears. This may have come from an area that rubbed underneath the cast from last week that we noted we padded that area and it looks to be doing excellent at this point but nonetheless the leg was somewhat painful, swollen, and  somewhat erythematous. She also had an elevated white blood cell count of 11.5 based on what I saw on looking at her records from the med center in [facility] from where she was seen yesterday. Unfortunately with [place] having a provider on vacation there was no one here in the clinic in the afternoon when she called therefore she went to the ER as advised. Subsequently they did not cut off the cast as they did not have anyone from orthopedics there to do so and subsequently also did not have the ability to do the Doppler for evaluation of DVT They recommended therefore given her dose of Eliquis as well as . Augmentin and sent her home to come see [place] today to have the cast taken off and then she is supposed to go back to have the study for DVT  performed they are following. 03/20/2020 upon evaluation today patient appears to be doing well with regard to her foot all things considered we have not been able to use the total contact cast due to the infection that she had last week. She has been on the doxycycline and she had a 10-day supply of that I do believe that is helping and her leg appears to be doing better. With that being said there is fortunately no signs of active infection systemically at this time which is good news. No fevers, chills, nausea, vomiting, or diarrhea. 03/27/2020 upon evaluation today patient appears to be doing well with regard to her foot ulcer. There does not appear to be signs of active infection which is great news. Overall I am very pleased with where things stand at this point. 04/03/2020 upon evaluation today patient appears to be doing pretty well in regard to the overall appearance of her wound. Fortunately there is no signs of active infection at this time which is great news. No fevers, chills, nausea, vomiting, or diarrhea. With that being said she does have some blue-green drainage that actually is a little bit concerning to me for the possibility of Pseudomonas. I  discussed that with the patient today. With that being said I do believe that we may be able to manage this however with the topical antibiotic cream as opposed to having to do anything oral especially since she seems to be doing so well with overall appearance of the wound. 04/10/2020 on evaluation today patient appears to be doing about the same roughly in regard to the overall size of her wound. With that being said she fortunately has not shown any signs of worsening overall which is good news. I do believe that she is doing a great job trying to offload but again she may still do better with the cast. I do not see in the blue-green drainage that we noticed previously I do believe the gentamicin help in this regard. Objective Constitutional Well-nourished and well-hydrated in no acute distress. Vitals Time Taken: 2:23 PM, Height: 66 in, Weight: 245 lbs, BMI: 39.5, Temperature: 98.7 F, Pulse: 116 bpm, Respiratory Rate: 18 breaths/min, Blood Pressure: 127/84 mmHg. Respiratory normal breathing without difficulty. Psychiatric this patient is able to make decisions and demonstrates good insight into disease process. Alert and Oriented x 3. pleasant and cooperative. General Notes: Upon inspection patient's wound bed actually showed signs of good granulation at this time there does not appear to be any evidence of active infection which is great news overall very pleased with where things stand. The patient is likewise extremely happy to hear that things are doing so well. Integumentary (Hair, Skin) Wound #1 status is Open. Original cause of wound was Gradually Appeared. The wound is located on the [place]. The wound measures 0.7cm length x 0.9cm width x 0.4cm depth; 0.495cm^2 area and 0.198cm^3 volume. There is Fat Layer (Subcutaneous Tissue) exposed. There is no tunneling or undermining noted. There is a small amount of serosanguineous drainage noted. The wound margin is well  defined and not attached to the wound base. There is large (67-100%) red granulation within the wound bed. There is a small (1-33%) amount of necrotic tissue within the wound bed including Adherent Slough. Assessment Active Problems ICD-10 Type 2 diabetes mellitus with foot ulcer Non-pressure chronic ulcer of other part of left foot with fat layer exposed Charcot's joint, left ankle and foot Type 2 diabetes mellitus with diabetic neuropathy, unspecified Essential (primary) hypertension  Plan Follow-up Appointments: Return Appointment in 1 week. - with Margarita Grizzle Dressing Change Frequency: Wound #1 Left,Medial,Plantar Foot: Change Dressing every other day. Skin Barriers/Peri-Wound Care: Moisturizing lotion - to foot with dressing changes Wound Cleansing: Wound #1 Left,Medial,Plantar Foot: May shower and wash wound with soap and water. Primary Wound Dressing: Wound #1 Left,Medial,Plantar Foot: Silver Collagen - moisten with saline Other: - gentamycin ointment to wound bed under collagen Secondary Dressing: Wound #1 Left,Medial,Plantar Foot: Foam - donut Kerlix/Rolled Gauze Dry Gauze Drawtex - cut to fit inside wound to hold collagen in place Edema Control: Avoid standing for long periods of time Elevate legs to the level of the heart or above for 30 minutes daily and/or when sitting, a frequency of: Off-Loading: Open toe surgical shoe to: - left foot 1. I Georgina Peer suggest that we going continue with the wound care measures as before specifically with regard to the gentamicin cream followed by the silver collagen dressing which I think is probably the best way to go still 2. I am also can recommend that we continue with a foam donut around to keep pressure off. 3. We will also continue with the drawtex to hold the collagen in place. We will see patient back for reevaluation in 1 week here in the clinic. If anything worsens or changes patient will contact our office for  additional recommendations. If she is not doing significantly better come next week we will consider go ahead and reinitiating the total contact cast. Electronic Signature(s) Signed: 04/10/2020 2:52:33 PM By: Worthy Keeler PA-C Entered By: Worthy Keeler on 04/10/2020 14:52:32 -------------------------------------------------------------------------------- SuperBill Details Patient Name: Date of Service: [name] [name], [name] RA E. 04/10/2020 Medical Record Number: [id number] Patient Account Number: [id number] Date of Birth/Sex: Treating RN: 16-Sep-1945 (74 y.o. [name] [name] Primary Care Provider: [name] Other Clinician: Referring Provider: Treating Provider/Extender: Adele Schilder in Treatment: 12 Diagnosis Coding ICD-10 Codes Code Description E11.621 Type 2 diabetes mellitus with foot ulcer L97.522 Non-pressure chronic ulcer of other part of left foot with fat layer exposed M14.672 Charcot's joint, left ankle and foot E11.40 Type 2 diabetes mellitus with diabetic neuropathy, unspecified I10 Essential (primary) hypertension Facility Procedures Physician Procedures : CPT4 Code Description Modifier [id number] - WC PHYS LEVEL 3 - EST PT ICD-10 Diagnosis Description E11.621 Type 2 diabetes mellitus with foot ulcer L97.522 Non-pressure chronic ulcer of other part of left foot with fat layer exposed M14.672 Charcot's  joint, left ankle and foot E11.40 Type 2 diabetes mellitus with diabetic neuropathy, unspecified Quantity: 1 Electronic Signature(s) Signed: 04/10/2020 2:53:04 PM By: Worthy Keeler PA-C Entered By: Worthy Keeler on 04/10/2020 14:53:04

## 2020-04-10 NOTE — Progress Notes (Signed)
NICIE, MILAN ([id number]) Visit Report for 04/10/2020 Arrival Information Details Patient Name: Date of Service: TOSHIYE, KEVER RA E. 04/10/2020 1:30 PM Medical Record Number: [id number] Patient Account Number: [id number] Date of Birth/Sex: Treating RN: 10/10/1945 (74 y.o. [name] Primary Care Ashwika Freels: [name] Other Clinician: Referring Tywanda Rice: Treating Nancy Manuele/Extender: Adele Schilder in Treatment: 12 Visit Information History Since Last Visit All ordered tests and consults were completed: No Patient Arrived: Wheel Chair Added or deleted any medications: No Arrival Time: 14:23 Any new allergies or adverse reactions: No Accompanied By: self Had a fall or experienced change in No Transfer Assistance: None activities of daily living that may affect Patient Identification Verified: Yes risk of falls: Secondary Verification Process Completed: Yes Signs or symptoms of abuse/neglect since last visito No Patient Requires Transmission-Based Precautions: No Hospitalized since last visit: No Patient Has Alerts: No Implantable device outside of the clinic excluding No cellular tissue based products placed in the center since last visit: Has Dressing in Place as Prescribed: Yes Pain Present Now: No Electronic Signature(s) Signed: 04/10/2020 5:45:10 PM By: Carlene Coria RN Entered By: Carlene Coria on 04/10/2020 14:23:44 -------------------------------------------------------------------------------- Clinic Level of Care Assessment Details Patient Name: Date of Service: TILLY, PERNICE RA E. 04/10/2020 1:30 PM Medical Record Number: [id number] Patient Account Number: [id number] Date of Birth/Sex: Treating RN: 1946-03-17 (74 y.o. [name] Primary Care Nainika Newlun: [name] Other Clinician: Referring Arlene Brickel: Treating Zoii Florer/Extender: Adele Schilder in Treatment: 12 Clinic Level of Care Assessment Items TOOL 4 Quantity  Score []  - 0 Use when only an EandM is performed on FOLLOW-UP visit ASSESSMENTS - Nursing Assessment / Reassessment X- 1 10 Reassessment of Co-morbidities (includes updates in patient status) X- 1 5 Reassessment of Adherence to Treatment Plan ASSESSMENTS - Wound and Skin A ssessment / Reassessment X - Simple Wound Assessment / Reassessment - one wound 1 5 []  - 0 Complex Wound Assessment / Reassessment - multiple wounds []  - 0 Dermatologic / Skin Assessment (not related to wound area) ASSESSMENTS - Focused Assessment []  - 0 Circumferential Edema Measurements - multi extremities []  - 0 Nutritional Assessment / Counseling / Intervention X- 1 5 Lower Extremity Assessment (monofilament, tuning fork, pulses) []  - 0 Peripheral Arterial Disease Assessment (using hand held doppler) ASSESSMENTS - Ostomy and/or Continence Assessment and Care []  - 0 Incontinence Assessment and Management []  - 0 Ostomy Care Assessment and Management (repouching, etc.) PROCESS - Coordination of Care X - Simple Patient / Family Education for ongoing care 1 15 []  - 0 Complex (extensive) Patient / Family Education for ongoing care X- 1 10 Staff obtains Programmer, systems, Records, T Results / Process Orders est []  - 0 Staff telephones HHA, Nursing Homes / Clarify orders / etc []  - 0 Routine Transfer to another Facility (non-emergent condition) []  - 0 Routine Hospital Admission (non-emergent condition) []  - 0 New Admissions / Biomedical engineer / Ordering NPWT Apligraf, etc. , []  - 0 Emergency Hospital Admission (emergent condition) X- 1 10 Simple Discharge Coordination []  - 0 Complex (extensive) Discharge Coordination PROCESS - Special Needs []  - 0 Pediatric / Minor Patient Management []  - 0 Isolation Patient Management []  - 0 Hearing / Language / Visual special needs []  - 0 Assessment of Community assistance (transportation, D/C planning, etc.) []  - 0 Additional assistance / Altered  mentation []  - 0 Support Surface(s) Assessment (bed, cushion, seat, etc.) INTERVENTIONS - Wound Cleansing / Measurement X - Simple Wound Cleansing - one  wound 1 5 $R'[]'fP$  - 0 Complex Wound Cleansing - multiple wounds X- 1 5 Wound Imaging (photographs - any number of wounds) $RemoveBe'[]'iAEYpWaBc$  - 0 Wound Tracing (instead of photographs) X- 1 5 Simple Wound Measurement - one wound $RemoveB'[]'atXXTFRl$  - 0 Complex Wound Measurement - multiple wounds INTERVENTIONS - Wound Dressings X - Small Wound Dressing one or multiple wounds 1 10 $Re'[]'EyL$  - 0 Medium Wound Dressing one or multiple wounds $RemoveBeforeD'[]'YfNFeAElyswyxx$  - 0 Large Wound Dressing one or multiple wounds X- 1 5 Application of Medications - topical $RemoveB'[]'JZLoINsO$  - 0 Application of Medications - injection INTERVENTIONS - Miscellaneous $RemoveBeforeD'[]'VAtiaIoVVAJxty$  - 0 External ear exam $Remove'[]'cMbOWlb$  - 0 Specimen Collection (cultures, biopsies, blood, body fluids, etc.) $RemoveBefor'[]'RvKsVlkSXMkx$  - 0 Specimen(s) / Culture(s) sent or taken to Lab for analysis $RemoveBefo'[]'WGKbXyhAzJl$  - 0 Patient Transfer (multiple staff / Civil Service fast streamer / Similar devices) $RemoveBeforeDE'[]'azZGtxOkIIdFDti$  - 0 Simple Staple / Suture removal (25 or less) $Remove'[]'BDwOCQk$  - 0 Complex Staple / Suture removal (26 or more) $Remove'[]'bofGtxQ$  - 0 Hypo / Hyperglycemic Management (close monitor of Blood Glucose) $RemoveBefore'[]'MLzTdssaVyjiU$  - 0 Ankle / Brachial Index (ABI) - do not check if billed separately X- 1 5 Vital Signs Has the patient been seen at the hospital within the last three years: Yes Total Score: 95 Level Of Care: New/Established - Level 3 Electronic Signature(s) Signed: 04/10/2020 6:44:49 PM By: [name] RN, BSN Entered By: [name] on 04/10/2020 14:47:00 -------------------------------------------------------------------------------- Encounter Discharge Information Details Patient Name: Date of Service: [name] 04/10/2020 1:30 PM Medical Record Number: [id number] Patient Account Number: [id number] Date of Birth/Sex: Treating RN: [birth year] (74 y.o. [name] Primary Care Amity Roes: [name] Other Clinician: Referring Nicki Gracy: Treating  Dezaray Shibuya/Extender: Adele Schilder in Treatment: 12 Encounter Discharge Information Items Discharge Condition: Stable Ambulatory Status: Ambulatory Discharge Destination: Home Transportation: Private Auto Accompanied By: self Schedule Follow-up Appointment: Yes Clinical Summary of Care: Patient Declined Electronic Signature(s) Signed: 04/10/2020 5:45:10 PM By: Carlene Coria RN Entered By: Carlene Coria on 04/10/2020 15:08:33 -------------------------------------------------------------------------------- Lower Extremity Assessment Details Patient Name: Date of Service: JAMILIA, JACQUES RA E. 04/10/2020 1:30 PM Medical Record Number: [id number] Patient Account Number: [id number] Date of Birth/Sex: Treating RN: [birth year] (74 y.o. [name] Primary Care Mirna Sutcliffe: [name] Other Clinician: Referring Chrisanne Loose: Treating Rashaunda Rahl/Extender: Landis Martins Weeks in Treatment: 12 Edema Assessment Assessed: [Left: No] [Right: No] Edema: [Left: Ye] [Right: s] Calf Left: Right: Point of Measurement: 35 cm From Medial Instep 38 cm cm Ankle Left: Right: Point of Measurement: 9 cm From Medial Instep 27 cm cm Electronic Signature(s) Signed: 04/10/2020 5:45:10 PM By: Carlene Coria RN Entered By: Carlene Coria on 04/10/2020 14:24:26 -------------------------------------------------------------------------------- Multi-Disciplinary Care Plan Details Patient Name: Date of Service: [name] 04/10/2020 1:30 PM Medical Record Number: [id number] Patient Account Number: [id number] Date of Birth/Sex: Treating RN: 1946/06/24 (74 y.o. [name] Primary Care Demitra Danley: [name] Other Clinician: Referring Kam Kushnir: Treating Amauria Younts/Extender: Adele Schilder in Treatment: 12 Active Inactive Nutrition Nursing Diagnoses: Impaired glucose control: actual or potential Potential for alteratiion in Nutrition/Potential for imbalanced  nutrition Goals: Patient/caregiver verbalizes understanding of need to maintain therapeutic glucose control per primary care physician Date Initiated: 01/17/2020 Date Inactivated: 02/14/2020 Target Resolution Date: 02/14/2020 Goal Status: Met Patient/caregiver will maintain therapeutic glucose control Date Initiated: 01/17/2020 Target Resolution Date: 05/08/2020 Goal Status: Active Interventions: Assess HgA1c results as ordered upon admission and as needed Assess patient nutrition upon admission and as needed  per policy Provide education on elevated blood sugars and impact on wound healing Treatment Activities: Patient referred to Primary Care Physician for further nutritional evaluation : 01/17/2020 Notes: Wound/Skin Impairment Nursing Diagnoses: Impaired tissue integrity Knowledge deficit related to ulceration/compromised skin integrity Goals: Patient/caregiver will verbalize understanding of skin care regimen Date Initiated: 01/17/2020 Target Resolution Date: 05/08/2020 Goal Status: Active Ulcer/skin breakdown will have a volume reduction of 30% by week 4 Date Initiated: 01/17/2020 Date Inactivated: 02/14/2020 Target Resolution Date: 02/14/2020 Goal Status: Met Ulcer/skin breakdown will have a volume reduction of 50% by week 8 Date Initiated: 02/14/2020 Date Inactivated: 03/13/2020 Target Resolution Date: 03/13/2020 Goal Status: Met Ulcer/skin breakdown will have a volume reduction of 80% by week 12 Date Initiated: 03/13/2020 Date Inactivated: 04/10/2020 Target Resolution Date: 04/10/2020 Goal Status: Unmet Unmet Reason: infection Interventions: Assess patient/caregiver ability to obtain necessary supplies Assess patient/caregiver ability to perform ulcer/skin care regimen upon admission and as needed Assess ulceration(s) every visit Provide education on ulcer and skin care Treatment Activities: Skin care regimen initiated : 01/17/2020 Topical wound management initiated :  01/17/2020 Notes: Electronic Signature(s) Signed: 04/10/2020 6:44:49 PM By: Zenaida Deed RN, BSN Entered By: Zenaida Deed on 04/10/2020 14:27:16 -------------------------------------------------------------------------------- Pain Assessment Details Patient Name: Date of Service: Delton [place] RA E. 04/10/2020 1:30 PM Medical Record Number: [id number] Patient Account Number: [id number] Date of Birth/Sex: Treating RN: [birth year] (74 y.o. Freddy Finner Primary Care Paizley Ramella: Cheryll Cockayne Other Clinician: Referring Kasandra Fehr: Treating Brina Umeda/Extender: Rickard Patience in Treatment: 12 Active Problems Location of Pain Severity and Description of Pain Patient Has Paino No Site Locations Pain Management and Medication Current Pain Management: Electronic Signature(s) Signed: 04/10/2020 5:45:10 PM By: Yevonne Pax RN Entered By: Yevonne Pax on 04/10/2020 14:24:15 -------------------------------------------------------------------------------- Patient/Caregiver Education Details Patient Name: Date of Service: Virl Diamond 9/1/2021andnbsp1:30 PM Medical Record Number: [id number] Patient Account Number: [id number] Date of Birth/Gender: Treating RN: July 05, 1946 (74 y.o. [name] Primary Care Physician: Cheryll Cockayne Other Clinician: Referring Physician: Treating Physician/Extender: Rickard Patience in Treatment: 12 Education Assessment Education Provided To: Patient Education Topics Provided Offloading: Methods: Explain/Verbal Responses: Reinforcements needed, State content correctly Wound/Skin Impairment: Methods: Explain/Verbal Responses: Reinforcements needed, State content correctly Electronic Signature(s) Signed: 04/10/2020 6:44:49 PM By: Zenaida Deed RN, BSN Entered By: Zenaida Deed on 04/10/2020 14:28:58 -------------------------------------------------------------------------------- Wound Assessment  Details Patient Name: Date of Service: Shepardson, [name] 04/10/2020 1:30 PM Medical Record Number: [id number] Patient Account Number: [id number] Date of Birth/Sex: Treating RN: 07-21-1946 (74 y.o. [name] Primary Care Eleesha Purkey: Cheryll Cockayne Other Clinician: Referring Bexleigh Theriault: Treating Artis Beggs/Extender: Atlee Abide Weeks in Treatment: 12 Wound Status Wound Number: 1 Primary Diabetic Wound/Ulcer of the Lower Extremity Etiology: Wound Location: Left, Medial, Plantar Foot Wound Open Wounding Event: Gradually Appeared Status: Date Acquired: 05/11/2019 Comorbid Sleep Apnea, Deep Vein Thrombosis, Hypertension, Colitis, Type Weeks Of Treatment: 12 History: II Diabetes, Gout, Osteoarthritis, Neuropathy Clustered Wound: No Wound Measurements Length: (cm) 0.7 Width: (cm) 0.9 Depth: (cm) 0.4 Area: (cm) 0.495 Volume: (cm) 0.198 % Reduction in Area: 51.5% % Reduction in Volume: 51.5% Epithelialization: Small (1-33%) Tunneling: No Undermining: No Wound Description Classification: Grade 2 Wound Margin: Well defined, not attached Exudate Amount: Small Exudate Type: Serosanguineous Exudate Color: red, brown Foul Odor After Cleansing: No Slough/Fibrino Yes Wound Bed Granulation Amount: Large (67-100%) Exposed Structure Granulation Quality: Red Fascia Exposed: No Necrotic Amount: Small (1-33%) Fat Layer (Subcutaneous Tissue) Exposed: Yes Necrotic Quality: Adherent Slough  Tendon Exposed: No Muscle Exposed: No Joint Exposed: No Bone Exposed: No Treatment Notes Wound #1 (Left, Medial, Plantar Foot) 1. Cleanse With Wound Cleanser 3. Primary Dressing Applied [employer] Other primary dressing (specifiy in notes) 4. Secondary Dressing Dry Gauze Roll Gauze Foam Drawtex 5. Secured With Tape Notes gentamycin Electronic Signature(s) Signed: 04/10/2020 6:44:49 PM By: [name] RN, BSN Entered By: [name] on 04/10/2020  14:44:48 -------------------------------------------------------------------------------- Vitals Details Patient Name: Date of Service: Duggar, [name] 04/10/2020 1:30 PM Medical Record Number: [id number] Patient Account Number: [id number] Date of Birth/Sex: Treating RN: [birth year] (74 y.o. [name] Primary Care Connelly Spruell: [name] Other Clinician: Referring Tempestt Silba: Treating Kiersten Coss/Extender: Adele Schilder in Treatment: 12 Vital Signs Time Taken: 14:23 Temperature (F): 98.7 Height (in): 66 Pulse (bpm): 116 Weight (lbs): 245 Respiratory Rate (breaths/min): 18 Body Mass Index (BMI): 39.5 Blood Pressure (mmHg): 127/84 Reference Range: 80 - 120 mg / dl Electronic Signature(s) Signed: 04/10/2020 5:45:10 PM By: Carlene Coria RN Entered By: Carlene Coria on 04/10/2020 14:24:08

## 2020-04-12 NOTE — Telephone Encounter (Signed)
Patient calling back. She states she call her at [phone], because her phone isn't working.

## 2020-04-16 NOTE — Telephone Encounter (Signed)
Returned a call to patient. Per man answering the call patient will be on a zoom call for the next hour. He will inform her that I called.

## 2020-04-17 ENCOUNTER — Encounter (HOSPITAL_BASED_OUTPATIENT_CLINIC_OR_DEPARTMENT_OTHER): Payer: Medicare Other | Admitting: Physician Assistant

## 2020-04-17 DIAGNOSIS — E11621 Type 2 diabetes mellitus with foot ulcer: Secondary | ICD-10-CM | POA: Diagnosis not present

## 2020-04-17 DIAGNOSIS — I890 Lymphedema, not elsewhere classified: Secondary | ICD-10-CM | POA: Diagnosis not present

## 2020-04-17 DIAGNOSIS — G4730 Sleep apnea, unspecified: Secondary | ICD-10-CM | POA: Diagnosis not present

## 2020-04-17 DIAGNOSIS — L97522 Non-pressure chronic ulcer of other part of left foot with fat layer exposed: Secondary | ICD-10-CM | POA: Diagnosis not present

## 2020-04-17 DIAGNOSIS — I10 Essential (primary) hypertension: Secondary | ICD-10-CM | POA: Diagnosis not present

## 2020-04-17 DIAGNOSIS — E1140 Type 2 diabetes mellitus with diabetic neuropathy, unspecified: Secondary | ICD-10-CM | POA: Diagnosis not present

## 2020-04-17 NOTE — Progress Notes (Addendum)
[name], [name] ([id number]) Visit Report for 04/17/2020 Chief Complaint Document Details Patient Name: Date of Service: [name] [name], [name] [name] 04/17/2020 12:45 PM Medical Record Number: [id number] Patient Account Number: [id number] Date of Birth/Sex: Treating RN: August 30, 1945 (74 y.o. [name] Other Clinician: Referring Provider: Treating Provider/Extender: Adele Schilder in Treatment: 13 Information Obtained from: Patient Chief Complaint Left foot ulcer Electronic Signature(s) Signed: 04/17/2020 1:03:38 PM By: Worthy Keeler PA-C Entered By: Worthy Keeler on 04/17/2020 13:03:37 -------------------------------------------------------------------------------- Debridement Details Patient Name: Date of Service: [name] [name] 04/17/2020 12:45 PM Medical Record Number: [id number] Patient Account Number: [id number] Date of Birth/Sex: Treating RN: 1945-12-27 (74 y.o. [name] Other Clinician: Referring Provider: Treating Provider/Extender: Adele Schilder in Treatment: 13 Debridement Performed for Assessment: Wound #1 Left,Medial,Plantar Foot Performed By: Physician Worthy Keeler, PA Debridement Type: Debridement Severity of Tissue Pre Debridement: Fat layer exposed Level of Consciousness (Pre-procedure): Awake and Alert Pre-procedure Verification/Time Out Yes - 13:47 Taken: Start Time: 13:49 Pain Control: Lidocaine 4% T opical Solution T Area Debrided (L x W): otal 0.9 (cm) x 1 (cm) = 0.9 (cm) Tissue and other material debrided: Viable, Non-Viable, Callus, Slough, Subcutaneous, Skin: Epidermis, Slough Level: Skin/Subcutaneous Tissue Debridement Description: Excisional Instrument: Curette Bleeding: Minimum Hemostasis Achieved: Pressure End Time: 13:52 Procedural Pain: 0 Post Procedural Pain: 0 Response to Treatment: Procedure was tolerated  well Level of Consciousness (Post- Awake and Alert procedure): Post Debridement Measurements of Total Wound Length: (cm) 0.9 Width: (cm) 1 Depth: (cm) 0.4 Volume: (cm) 0.283 Character of Wound/Ulcer Post Debridement: Improved Severity of Tissue Post Debridement: Fat layer exposed Post Procedure Diagnosis Same as Pre-procedure Electronic Signature(s) Signed: 04/17/2020 5:28:47 PM By: [name] RN, BSN Signed: 04/17/2020 5:46:37 PM By: Worthy Keeler PA-C Entered By: [name] on 04/17/2020 13:51:12 -------------------------------------------------------------------------------- HPI Details Patient Name: Date of Service: [name], [name] RA E. 04/17/2020 12:45 PM Medical Record Number: [id number] Patient Account Number: [id number] Date of Birth/Sex: Treating RN: Dec 22, 1945 (74 y.o. [name] Other Clinician: Referring Provider: Treating Provider/Extender: Adele Schilder in Treatment: 13 History of Present Illness HPI Description: 01/17/2020 upon evaluation today patient presents for initial evaluation here in our clinic concerning issues she has been having with a left medial/plantar foot ulcer. This is actually been an issue for her since October 2020. She has been seeing Dr. Doran Durand for quite some time during that course. Fortunately there is no signs of active infection at this time. Or least no mention of this to have seen in general. With that being said unfortunately I do see some signs of erythema noted today that does have me concerned about the possibility of infection at this [name] in the surrounding area of the wound. There is also a warm to touch at the site which is somewhat concerning. Fortunately there is no evidence of systemic infection which is great news. The patient does have a history of diabetes mellitus type 2, Charcot foot which is what led to the wound, and hypertension. She notes that she was in  a cast for some time with Dr. Doran Durand for about 8 weeks. During that time they were utilizing according to the patient silver nitrate along with a foam doughnut and then Coban to secure in place in the cast in place. With that being said I do not have the  actual records to review we are going to try to get a hold of those unfortunately they would not flow over into care everywhere I did look today. She has been seeing Dr. Doran Durand and his physician assistant Larkin Ina up until the end of May and apparently is still seeing them on a regular basis every 2 weeks roughly. She has also tried Iodosorb without effect here. 01/24/2020 upon evaluation today patient actually appears to be doing quite well with regard to her wounds. She has been tolerating the dressing changes without complication. Fortunately there is no signs of active infection spreading which is good news. Her culture did show signs of Staph aureus I did place her on Augmentin due to the erythema surrounding the wound. With that being said the wound does appear to be doing better she has her longer walking cast/boot and I think that is actually good for her for the time being. I am considering reinitiating total contact cast when she gets back from vacation but next week she will actually be out of town at [employer] she knows not to get in the water but she still obviously is planning to enjoy herself she is going to take it easy on her foot however. 02/07/2020 upon evaluation today patient appears to be doing fairly well in regard to her ulcer on her foot. Fortunately there is no signs of severe infection at this time which is great news and overall very pleased in that regard. With that being said I do think that she could still benefit from a total contact cast. Nonetheless she is using her walking boot which at least provide some protection and that it prevents some of the friction occurring when she is ambulating. 02/14/2020 upon evaluation today  patient appears to be doing well with regard to her foot ulcer. This is actually measuring a little bit smaller yet again this week. Overall very pleased with where things stand and I do not see any signs of active infection at this time which is also good news. Since she is measuring better the patient has wanting to somewhat hold off on proceeding with the total contact cast which I think is reasonable at this [name]. 02/28/2020 on evaluation today patient appears to be doing well in general in regard to her wound although she has a lot of callus buildup as compared to last time I saw her. This is can require sharp debridement today. I do believe she really needs the total contact cast as well which we have discussed previous. 7/23; patient comes in for a total contact cast change 03/06/2020 on evaluation today patient appears to be doing quite well with regard to her wounds. Fortunately the wound bed is measuring smaller and looking much better there is little callus noted although there is some debridement necessary today. 03/13/2020 on evaluation today patient's wound actually appears to be doing excellent which is great news. With that being said unfortunately she is having some issues currently with her left leg where she does have cellulitis it appears. This may have come from an area that rubbed underneath the cast from last week that we noted we padded that area and it looks to be doing excellent at this [name] but nonetheless the leg was somewhat painful, swollen, and somewhat erythematous. She also had an elevated white blood cell count of 11.5 based on what I saw on looking at her records from the med center in [facility] from where she was seen yesterday. Unfortunately  with [place] having a provider on vacation there was no one here in the clinic in the afternoon when she called therefore she went to the ER as advised. Subsequently they did not cut off the cast as they did not have anyone from  orthopedics there to do so and subsequently also did not have the ability to do the Doppler for evaluation of DVT They recommended therefore given her dose of Eliquis as well as . Augmentin and sent her home to come see [place] today to have the cast taken off and then she is supposed to go back to have the study for DVT performed they are following. 03/20/2020 upon evaluation today patient appears to be doing well with regard to her foot all things considered we have not been able to use the total contact cast due to the infection that she had last week. She has been on the doxycycline and she had a 10-day supply of that I do believe that is helping and her leg appears to be doing better. With that being said there is fortunately no signs of active infection systemically at this time which is good news. No fevers, chills, nausea, vomiting, or diarrhea. 03/27/2020 upon evaluation today patient appears to be doing well with regard to her foot ulcer. There does not appear to be signs of active infection which is great news. Overall I am very pleased with where things stand at this [name]. 04/03/2020 upon evaluation today patient appears to be doing pretty well in regard to the overall appearance of her wound. Fortunately there is no signs of active infection at this time which is great news. No fevers, chills, nausea, vomiting, or diarrhea. With that being said she does have some blue-green drainage that actually is a little bit concerning to me for the possibility of Pseudomonas. I discussed that with the patient today. With that being said I do believe that we may be able to manage this however with the topical antibiotic cream as opposed to having to do anything oral especially since she seems to be doing so well with overall appearance of the wound. 04/10/2020 on evaluation today patient appears to be doing about the same roughly in regard to the overall size of her wound. With that being said  she fortunately has not shown any signs of worsening overall which is good news. I do believe that she is doing a great job trying to offload but again she may still do better with the cast. I do not see in the blue-green drainage that we noticed previously I do believe the gentamicin help in this regard. 04/17/2020 on evaluation today patient's wound appears to be doing about the same at this [name]. There is no significant improvement at this [name]. No fever chills noted. She is up for put the cast back on the day. That she states in a couple weeks she will need to have this off to go to a workshop. Electronic Signature(s) Signed: 04/17/2020 2:00:11 PM By: Worthy Keeler PA-C Entered By: Worthy Keeler on 04/17/2020 14:00:10 -------------------------------------------------------------------------------- Physical Exam Details Patient Name: Date of Service: [name] [name], [name] [name] 04/17/2020 12:45 PM Medical Record Number: [id number] Patient Account Number: [id number] Date of Birth/Sex: Treating RN: 16-Sep-1945 (74 y.o. [name] Other Clinician: Referring Provider: Treating Provider/Extender: Landis Martins Weeks in Treatment: 30 Constitutional Well-nourished and well-hydrated in no acute distress. Respiratory normal breathing without difficulty. Psychiatric this patient is able  to make decisions and demonstrates good insight into disease process. Alert and Oriented x 3. pleasant and cooperative. Notes Upon inspection patient's wound bed actually showed signs of good granulation there was some slough noted I did perform short debridement to clear away some of the slough as well as callus from around the edges of the wound she tolerated all this without complication. Electronic Signature(s) Signed: 04/17/2020 2:08:08 PM By: Worthy Keeler PA-C Entered By: Worthy Keeler on 04/17/2020  14:08:07 -------------------------------------------------------------------------------- Physician Orders Details Patient Name: Date of Service: [place], [place] RBA RA E. 04/17/2020 12:45 PM Medical Record Number: [id number] Patient Account Number: [id number] Date of Birth/Sex: Treating RN: [birth year] (74 y.o. [name] Other Clinician: Referring Provider: Treating Provider/Extender: Adele Schilder in Treatment: [phone] Verbal / Phone Orders: No Diagnosis Coding ICD-10 Coding Code Description E11.621 Type 2 diabetes mellitus with foot ulcer L97.522 Non-pressure chronic ulcer of other part of left foot with fat layer exposed M14.672 Charcot's joint, left ankle and foot E11.40 Type 2 diabetes mellitus with diabetic neuropathy, unspecified I10 Essential (primary) hypertension Follow-up Appointments ppointment in 1 week. - with Margarita Grizzle Return A Dressing Change Frequency Wound #1 Left,Medial,Plantar Foot Do not change entire dressing for one week. Skin Barriers/Peri-Wound Care Moisturizing lotion - to foot and leg with dressing changes Wound Cleansing Wound #1 Left,Medial,Plantar Foot May shower with protection. Primary Wound Dressing Wound #1 Left,Medial,Plantar Foot Hydrofera Blue - ready cut to fit inside wound margins Other: - gentamycin ointment to wound bed under hydrofera Secondary Dressing Wound #1 Left,Medial,Plantar Foot Foam - donut Kerlix/Rolled Gauze Dry Gauze Edema Control Avoid standing for long periods of time Elevate legs to the level of the heart or above for 30 minutes daily and/or when sitting, a frequency of: Off-Loading Total Contact Cast to Left Lower Extremity Electronic Signature(s) Signed: 04/17/2020 5:28:47 PM By: [name] RN, BSN Signed: 04/17/2020 5:46:37 PM By: Worthy Keeler PA-C Entered By: [name] on 04/17/2020  13:54:33 -------------------------------------------------------------------------------- Problem List Details Patient Name: Date of Service: [name] [name] 04/17/2020 12:45 PM Medical Record Number: [id number] Patient Account Number: [id number] Date of Birth/Sex: Treating RN: March 05, 1946 (74 y.o. [name] Other Clinician: Referring Provider: Treating Provider/Extender: Adele Schilder in Treatment: 13 Active Problems ICD-10 Encounter Code Description Active Date MDM Diagnosis E11.621 Type 2 diabetes mellitus with foot ulcer 01/17/2020 No Yes L97.522 Non-pressure chronic ulcer of other part of left foot with fat layer exposed 01/17/2020 No Yes M14.672 Charcot's joint, left ankle and foot 01/17/2020 No Yes E11.40 Type 2 diabetes mellitus with diabetic neuropathy, unspecified 01/17/2020 No Yes I10 Essential (primary) hypertension 01/17/2020 No Yes Inactive Problems Resolved Problems Electronic Signature(s) Signed: 04/17/2020 1:03:32 PM By: Worthy Keeler PA-C Entered By: Worthy Keeler on 04/17/2020 13:03:31 -------------------------------------------------------------------------------- Progress Note Details Patient Name: Date of Service: [name] [name], [name] [name] RBA RA E. 04/17/2020 12:45 PM Medical Record Number: [id number] Patient Account Number: [id number] Date of Birth/Sex: Treating RN: 18-Jan-1946 (74 y.o. [name] Other Clinician: Referring Provider: Treating Provider/Extender: Adele Schilder in Treatment: 13 Subjective Chief Complaint Information obtained from Patient Left foot ulcer History of Present Illness (HPI) 01/17/2020 upon evaluation today patient presents for initial evaluation here in our clinic concerning issues she has been having with a left medial/plantar foot ulcer. This is actually been an issue for her since October 2020. She  has been seeing Dr.  Doran Durand for quite some time during that course. Fortunately there is no signs of active infection at this time. Or least no mention of this to have seen in general. With that being said unfortunately I do see some signs of erythema noted today that does have me concerned about the possibility of infection at this [name] in the surrounding area of the wound. There is also a warm to touch at the site which is somewhat concerning. Fortunately there is no evidence of systemic infection which is great news. The patient does have a history of diabetes mellitus type 2, Charcot foot which is what led to the wound, and hypertension. She notes that she was in a cast for some time with Dr. Doran Durand for about 8 weeks. During that time they were utilizing according to the patient silver nitrate along with a foam doughnut and then Coban to secure in place in the cast in place. With that being said I do not have the actual records to review we are going to try to get a hold of those unfortunately they would not flow over into care everywhere I did look today. She has been seeing Dr. Doran Durand and his physician assistant Larkin Ina up until the end of May and apparently is still seeing them on a regular basis every 2 weeks roughly. She has also tried Iodosorb without effect here. 01/24/2020 upon evaluation today patient actually appears to be doing quite well with regard to her wounds. She has been tolerating the dressing changes without complication. Fortunately there is no signs of active infection spreading which is good news. Her culture did show signs of Staph aureus I did place her on Augmentin due to the erythema surrounding the wound. With that being said the wound does appear to be doing better she has her longer walking cast/boot and I think that is actually good for her for the time being. I am considering reinitiating total contact cast when she gets back from vacation but next week she will actually be out of town at  [employer] she knows not to get in the water but she still obviously is planning to enjoy herself she is going to take it easy on her foot however. 02/07/2020 upon evaluation today patient appears to be doing fairly well in regard to her ulcer on her foot. Fortunately there is no signs of severe infection at this time which is great news and overall very pleased in that regard. With that being said I do think that she could still benefit from a total contact cast. Nonetheless she is using her walking boot which at least provide some protection and that it prevents some of the friction occurring when she is ambulating. 02/14/2020 upon evaluation today patient appears to be doing well with regard to her foot ulcer. This is actually measuring a little bit smaller yet again this week. Overall very pleased with where things stand and I do not see any signs of active infection at this time which is also good news. Since she is measuring better the patient has wanting to somewhat hold off on proceeding with the total contact cast which I think is reasonable at this [name]. 02/28/2020 on evaluation today patient appears to be doing well in general in regard to her wound although she has a lot of callus buildup as compared to last time I saw her. This is can require sharp debridement today. I do believe she really needs the total contact  cast as well which we have discussed previous. 7/23; patient comes in for a total contact cast change 03/06/2020 on evaluation today patient appears to be doing quite well with regard to her wounds. Fortunately the wound bed is measuring smaller and looking much better there is little callus noted although there is some debridement necessary today. 03/13/2020 on evaluation today patient's wound actually appears to be doing excellent which is great news. With that being said unfortunately she is having some issues currently with her left leg where she does have cellulitis it appears.  This may have come from an area that rubbed underneath the cast from last week that we noted we padded that area and it looks to be doing excellent at this [name] but nonetheless the leg was somewhat painful, swollen, and somewhat erythematous. She also had an elevated white blood cell count of 11.5 based on what I saw on looking at her records from the med center in [facility] from where she was seen yesterday. Unfortunately with [place] having a provider on vacation there was no one here in the clinic in the afternoon when she called therefore she went to the ER as advised. Subsequently they did not cut off the cast as they did not have anyone from orthopedics there to do so and subsequently also did not have the ability to do the Doppler for evaluation of DVT They recommended therefore given her dose of Eliquis as well as . Augmentin and sent her home to come see [place] today to have the cast taken off and then she is supposed to go back to have the study for DVT performed they are following. 03/20/2020 upon evaluation today patient appears to be doing well with regard to her foot all things considered we have not been able to use the total contact cast due to the infection that she had last week. She has been on the doxycycline and she had a 10-day supply of that I do believe that is helping and her leg appears to be doing better. With that being said there is fortunately no signs of active infection systemically at this time which is good news. No fevers, chills, nausea, vomiting, or diarrhea. 03/27/2020 upon evaluation today patient appears to be doing well with regard to her foot ulcer. There does not appear to be signs of active infection which is great news. Overall I am very pleased with where things stand at this [name]. 04/03/2020 upon evaluation today patient appears to be doing pretty well in regard to the overall appearance of her wound. Fortunately there is no signs of active infection at this time  which is great news. No fevers, chills, nausea, vomiting, or diarrhea. With that being said she does have some blue-green drainage that actually is a little bit concerning to me for the possibility of Pseudomonas. I discussed that with the patient today. With that being said I do believe that we may be able to manage this however with the topical antibiotic cream as opposed to having to do anything oral especially since she seems to be doing so well with overall appearance of the wound. 04/10/2020 on evaluation today patient appears to be doing about the same roughly in regard to the overall size of her wound. With that being said she fortunately has not shown any signs of worsening overall which is good news. I do believe that she is doing a great job trying to offload but again she may still do better with the  cast. I do not see in the blue-green drainage that we noticed previously I do believe the gentamicin help in this regard. 04/17/2020 on evaluation today patient's wound appears to be doing about the same at this [name]. There is no significant improvement at this [name]. No fever chills noted. She is up for put the cast back on the day. That she states in a couple weeks she will need to have this off to go to a workshop. Objective Constitutional Well-nourished and well-hydrated in no acute distress. Vitals Time Taken: 1:05 PM, Height: 66 in, Weight: 245 lbs, BMI: 39.5, Temperature: 98.5 F, Pulse: 109 bpm, Respiratory Rate: 18 breaths/min, Blood Pressure: 144/82 mmHg. Respiratory normal breathing without difficulty. Psychiatric this patient is able to make decisions and demonstrates good insight into disease process. Alert and Oriented x 3. pleasant and cooperative. General Notes: Upon inspection patient's wound bed actually showed signs of good granulation there was some slough noted I did perform short debridement to clear away some of the slough as well as callus from around the edges of the  wound she tolerated all this without complication. Integumentary (Hair, Skin) Wound #1 status is Open. Original cause of wound was Gradually Appeared. The wound is located on the [place]. The wound measures 0.9cm length x 1cm width x 0.4cm depth; 0.707cm^2 area and 0.283cm^3 volume. There is Fat Layer (Subcutaneous Tissue) exposed. There is no tunneling or undermining noted. There is a small amount of serosanguineous drainage noted. The wound margin is well defined and not attached to the wound base. There is large (67-100%) red granulation within the wound bed. There is a small (1-33%) amount of necrotic tissue within the wound bed including Adherent Slough. Assessment Active Problems ICD-10 Type 2 diabetes mellitus with foot ulcer Non-pressure chronic ulcer of other part of left foot with fat layer exposed Charcot's joint, left ankle and foot Type 2 diabetes mellitus with diabetic neuropathy, unspecified Essential (primary) hypertension Procedures Wound #1 Pre-procedure diagnosis of Wound #1 is a Diabetic Wound/Ulcer of the Lower Extremity located on the Left,Medial,Plantar Foot .Severity of Tissue Pre Debridement is: Fat layer exposed. There was a Excisional Skin/Subcutaneous Tissue Debridement with a total area of 0.9 sq cm performed by Worthy Keeler, PA. With the following instrument(s): Curette to remove Viable and Non-Viable tissue/material. Material removed includes Callus, Subcutaneous Tissue, Slough, and Skin: Epidermis after achieving pain control using Lidocaine 4% Topical Solution. No specimens were taken. A time out was conducted at 13:47, prior to the start of the procedure. A Minimum amount of bleeding was controlled with Pressure. The procedure was tolerated well with a pain level of 0 throughout and a pain level of 0 following the procedure. Post Debridement Measurements: 0.9cm length x 1cm width x 0.4cm depth; 0.283cm^3 volume. Character of Wound/Ulcer Post  Debridement is improved. Severity of Tissue Post Debridement is: Fat layer exposed. Post procedure Diagnosis Wound #1: Same as Pre-Procedure Pre-procedure diagnosis of Wound #1 is a Diabetic Wound/Ulcer of the Lower Extremity located on the Left,Medial,Plantar Foot . There was a T Contact otal Cast Procedure by Worthy Keeler, PA. Post procedure Diagnosis Wound #1: Same as Pre-Procedure Plan Follow-up Appointments: Return Appointment in 1 week. - with Margarita Grizzle Dressing Change Frequency: Wound #1 Left,Medial,Plantar Foot: Do not change entire dressing for one week. Skin Barriers/Peri-Wound Care: Moisturizing lotion - to foot and leg with dressing changes Wound Cleansing: Wound #1 Left,Medial,Plantar Foot: May shower with protection. Primary Wound Dressing: Wound #1 Left,Medial,Plantar Foot: Hydrofera Blue -  ready cut to fit inside wound margins Other: - gentamycin ointment to wound bed under hydrofera Secondary Dressing: Wound #1 Left,Medial,Plantar Foot: Foam - donut Kerlix/Rolled Gauze Dry Gauze Edema Control: Avoid standing for long periods of time Elevate legs to the level of the heart or above for 30 minutes daily and/or when sitting, a frequency of: Off-Loading: T Contact Cast to Left Lower Extremity otal 1. I would recommend currently that we actually switch to [name] the patient felt like she did better with that in the past. 2. We can still use little bit of gentamicin cream underneath the Hydrofera Blue. 3. I am also can recommend that we have the patient initiate readministration of the total contact cast today. 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 We will see patient back for reevaluation in 1 week here in the clinic. If anything worsens or changes patient will contact our office for additional recommendations. Electronic  Signature(s) Signed: 04/17/2020 2:08:41 PM By: Worthy Keeler PA-C Entered By: Worthy Keeler on 04/17/2020 14:08:40 -------------------------------------------------------------------------------- Total Contact Cast Details Patient Name: Date of Service: [name] [name], [name] [name] 04/17/2020 12:45 PM Medical Record Number: [id number] Patient Account Number: [id number] Date of Birth/Sex: Treating RN: 15-Aug-1945 (74 y.o. [name] Other Clinician: Referring Provider: Treating Provider/Extender: Adele Schilder in Treatment: [phone] T Contact Cast Applied for Wound Assessment: otal Wound #1 Left,Medial,Plantar Foot Performed By: Physician Worthy Keeler, PA Post Procedure Diagnosis Same as Pre-procedure Electronic Signature(s) Signed: 04/17/2020 5:28:47 PM By: [name] RN, BSN Signed: 04/17/2020 5:46:37 PM By: Worthy Keeler PA-C Entered By: [name] on 04/17/2020 13:51:27 -------------------------------------------------------------------------------- SuperBill Details Patient Name: Date of Service: [name] [name] RBA RA E. 04/17/2020 Medical Record Number: [id number] Patient Account Number: [id number] Date of Birth/Sex: Treating RN: Jan 01, 1946 (74 y.o. [name] Other Clinician: Referring Provider: Treating Provider/Extender: Adele Schilder in Treatment: 13 Diagnosis Coding ICD-10 Codes Code Description E11.621 Type 2 diabetes mellitus with foot ulcer L97.522 Non-pressure chronic ulcer of other part of left foot with fat layer exposed M14.672 Charcot's joint, left ankle and foot E11.40 Type 2 diabetes mellitus with diabetic neuropathy, unspecified I10 Essential (primary) hypertension Facility Procedures CPT4 Code: 21224825 1 Description: 0037 - DEB SUBQ TISSUE 20 SQ CM/< ICD-10 Diagnosis Description L97.522 Non-pressure chronic ulcer of other part of left  foot with fat layer exposed Modifier: Quantity: 1 Physician Procedures : CPT4 Code Description Modifier [id number] 11042 - WC PHYS SUBQ TISS 20 SQ CM ICD-10 Diagnosis Description L97.522 Non-pressure chronic ulcer of other part of left foot with fat layer exposed Quantity: 1 Electronic Signature(s) Signed: 04/17/2020 2:08:52 PM By: Worthy Keeler PA-C Entered By: Worthy Keeler on 04/17/2020 14:08:51

## 2020-04-18 ENCOUNTER — Telehealth: Payer: Self-pay | Admitting: *Deleted

## 2020-04-18 ENCOUNTER — Telehealth: Payer: Self-pay | Admitting: Cardiovascular Disease

## 2020-04-18 DIAGNOSIS — L4059 Other psoriatic arthropathy: Secondary | ICD-10-CM | POA: Diagnosis not present

## 2020-04-18 NOTE — Telephone Encounter (Signed)
New message:    Patient returning call back from 04/16/20.p[lease call patient back.

## 2020-04-18 NOTE — Progress Notes (Signed)
[name], [name] ([id number]) Visit Report for 04/17/2020 Arrival Information Details Patient Name: Date of Service: [name], LAMPRON RA E. 04/17/2020 12:45 PM Medical Record Number: [id number] Patient Account Number: [id number] Date of Birth/Sex: Treating RN: [birth year] (74 y.o. [name], [name] Primary Care Sivan Cuello: [name] Other Clinician: Referring Maliyah Willets: Treating Brizeyda Holtmeyer/Extender: Adele Schilder in Treatment: 13 Visit Information History Since Last Visit Added or deleted any medications: No Patient Arrived: Wheel Chair Any new allergies or adverse reactions: No Arrival Time: 13:05 Had a fall or experienced change in No Accompanied By: self activities of daily living that may affect Transfer Assistance: None risk of falls: Patient Identification Verified: Yes Signs or symptoms of abuse/neglect since last visito No Secondary Verification Process Completed: Yes Hospitalized since last visit: No Patient Requires Transmission-Based Precautions: No Implantable device outside of the clinic excluding No Patient Has Alerts: No cellular tissue based products placed in the center since last visit: Has Dressing in Place as Prescribed: Yes Pain Present Now: No Electronic Signature(s) Signed: 04/18/2020 1:36:06 PM By: Sandre Kitty Entered By: Sandre Kitty on 04/17/2020 13:05:18 -------------------------------------------------------------------------------- Encounter Discharge Information Details Patient Name: Date of Service: [name] Amato RA E. 04/17/2020 12:45 PM Medical Record Number: [id number] Patient Account Number: [id number] Date of Birth/Sex: Treating RN: 1945-11-07 (74 y.o. [name] Primary Care Jini Horiuchi: [name] Other Clinician: Referring Ailany Koren: Treating Snigdha Howser/Extender: Adele Schilder in Treatment: 13 Encounter Discharge Information Items Post Procedure Vitals Discharge Condition: Stable Temperature  (F): 98.5 Ambulatory Status: Ambulatory Pulse (bpm): 109 Discharge Destination: Home Respiratory Rate (breaths/min): 18 Transportation: Private Auto Blood Pressure (mmHg): 144/82 Accompanied By: alone Schedule Follow-up Appointment: Yes Clinical Summary of Care: Patient Declined Electronic Signature(s) Signed: 04/18/2020 5:37:15 PM By: Levan Hurst RN, BSN Entered By: Levan Hurst on 04/17/2020 17:06:28 -------------------------------------------------------------------------------- Lower Extremity Assessment Details Patient Name: Date of Service: [name] [name], [name] RA E. 04/17/2020 12:45 PM Medical Record Number: [id number] Patient Account Number: [id number] Date of Birth/Sex: Treating RN: 11/11/1945 (74 y.o. [name] Primary Care Dannel Rafter: [name] Other Clinician: Referring Avigdor Dollar: Treating Evolett Somarriba/Extender: Landis Martins Weeks in Treatment: 13 Edema Assessment Assessed: [Left: Yes] [Right: No] Edema: [Left: Ye] [Right: s] Calf Left: Right: Point of Measurement: 35 cm From Medial Instep 36 cm cm Ankle Left: Right: Point of Measurement: 9 cm From Medial Instep 26 cm cm Vascular Assessment Pulses: Dorsalis Pedis Palpable: [Left:Yes] Electronic Signature(s) Signed: 04/17/2020 5:39:04 PM By: Deon Pilling Entered By: Deon Pilling on 04/17/2020 13:13:33 -------------------------------------------------------------------------------- Multi-Disciplinary Care Plan Details Patient Name: Date of Service: [name] Amato RA E. 04/17/2020 12:45 PM Medical Record Number: [id number] Patient Account Number: [id number] Date of Birth/Sex: Treating RN: 10-22-1945 (74 y.o. [name] [name] Primary Care Kizzi Overbey: [name] Other Clinician: Referring Deann Mclaine: Treating Paola Aleshire/Extender: Adele Schilder in Treatment: 13 Active Inactive Nutrition Nursing Diagnoses: Impaired glucose control: actual or potential Potential for alteratiion  in Nutrition/Potential for imbalanced nutrition Goals: Patient/caregiver verbalizes understanding of need to maintain therapeutic glucose control per primary care physician Date Initiated: 01/17/2020 Date Inactivated: 02/14/2020 Target Resolution Date: 02/14/2020 Goal Status: Met Patient/caregiver will maintain therapeutic glucose control Date Initiated: 01/17/2020 Target Resolution Date: 05/08/2020 Goal Status: Active Interventions: Assess HgA1c results as ordered upon admission and as needed Assess patient nutrition upon admission and as needed per policy Provide education on elevated blood sugars and impact on wound healing Treatment Activities: Patient referred to Primary Care Physician for further nutritional evaluation : 01/17/2020  Notes: Wound/Skin Impairment Nursing Diagnoses: Impaired tissue integrity Knowledge deficit related to ulceration/compromised skin integrity Goals: Patient/caregiver will verbalize understanding of skin care regimen Date Initiated: 01/17/2020 Target Resolution Date: 05/08/2020 Goal Status: Active Ulcer/skin breakdown will have a volume reduction of 30% by week 4 Date Initiated: 01/17/2020 Date Inactivated: 02/14/2020 Target Resolution Date: 02/14/2020 Goal Status: Met Ulcer/skin breakdown will have a volume reduction of 50% by week 8 Date Initiated: 02/14/2020 Date Inactivated: 03/13/2020 Target Resolution Date: 03/13/2020 Goal Status: Met Ulcer/skin breakdown will have a volume reduction of 80% by week 12 Date Initiated: 03/13/2020 Date Inactivated: 04/10/2020 Target Resolution Date: 04/10/2020 Goal Status: Unmet Unmet Reason: infection Interventions: Assess patient/caregiver ability to obtain necessary supplies Assess patient/caregiver ability to perform ulcer/skin care regimen upon admission and as needed Assess ulceration(s) every visit Provide education on ulcer and skin care Treatment Activities: Skin care regimen initiated : 01/17/2020 Topical wound management  initiated : 01/17/2020 Notes: Electronic Signature(s) Signed: 04/17/2020 5:28:47 PM By: [name] RN, BSN Entered By: [name] on 04/17/2020 13:28:45 -------------------------------------------------------------------------------- Pain Assessment Details Patient Name: Date of Service: [name] Amato RA E. 04/17/2020 12:45 PM Medical Record Number: [id number] Patient Account Number: [id number] Date of Birth/Sex: Treating RN: 05/03/1946 (74 y.o. [name] [name] Primary Care Curtiss Mahmood: [name] Other Clinician: Referring Jeriel Vivanco: Treating Joelee Snoke/Extender: Adele Schilder in Treatment: 13 Active Problems Location of Pain Severity and Description of Pain Patient Has Paino No Site Locations Pain Management and Medication Current Pain Management: Electronic Signature(s) Signed: 04/17/2020 5:28:47 PM By: [name] RN, BSN Signed: 04/18/2020 1:36:06 PM By: Sandre Kitty Entered By: Sandre Kitty on 04/17/2020 13:05:44 -------------------------------------------------------------------------------- Patient/Caregiver Education Details Patient Name: Date of Service: [name] Amato RA E. 9/8/2021andnbsp12:45 PM Medical Record Number: [id number] Patient Account Number: [id number] Date of Birth/Gender: Treating RN: 1946-02-06 (74 y.o. [name] [name] Primary Care Physician: [name] Other Clinician: Referring Physician: Treating Physician/Extender: Adele Schilder in Treatment: 13 Education Assessment Education Provided To: Patient Education Topics Provided Elevated Blood Sugar/ Impact on Healing: Methods: Explain/Verbal Responses: Reinforcements needed, State content correctly Offloading: Methods: Explain/Verbal Responses: Reinforcements needed, State content correctly Wound/Skin Impairment: Methods: Explain/Verbal Responses: Reinforcements needed, State content correctly Electronic Signature(s) Signed: 04/17/2020  5:28:47 PM By: [name] RN, BSN Entered By: [name] on 04/17/2020 13:29:15 -------------------------------------------------------------------------------- Wound Assessment Details Patient Name: Date of Service: [name] Amato RA E. 04/17/2020 12:45 PM Medical Record Number: [id number] Patient Account Number: [id number] Date of Birth/Sex: Treating RN: 01-30-1946 (74 y.o. [name] [name] Primary Care Roshan Salamon: [name] Other Clinician: Referring Ashanta Amoroso: Treating Myla Mauriello/Extender: Landis Martins Weeks in Treatment: 13 Wound Status Wound Number: 1 Primary Diabetic Wound/Ulcer of the Lower Extremity Etiology: Wound Location: Left, Medial, Plantar Foot Wound Open Wounding Event: Gradually Appeared Status: Date Acquired: 05/11/2019 Comorbid Sleep Apnea, Deep Vein Thrombosis, Hypertension, Colitis, Type Weeks Of Treatment: 13 History: II Diabetes, Gout, Osteoarthritis, Neuropathy Clustered Wound: No Photos Photo Uploaded By: Mikeal Hawthorne on 04/18/2020 13:58:06 Wound Measurements Length: (cm) 0.9 % Width: (cm) 1 % Depth: (cm) 0.4 Ep Area: (cm) 0.707 T Volume: (cm) 0.283 U Reduction in Area: 30.8% Reduction in Volume: 30.6% ithelialization: Small (1-33%) unneling: No ndermining: No Wound Description Classification: Grade 2 Fo Wound Margin: Well defined, not attached Sl Exudate Amount: Small Exudate Type: Serosanguineous Exudate Color: red, brown ul Odor After Cleansing: No ough/Fibrino Yes Wound Bed Granulation Amount: Large (67-100%) Exposed Structure Granulation Quality: Red Fascia Exposed: No Necrotic Amount: Small (1-33%) Fat  Layer (Subcutaneous Tissue) Exposed: Yes Necrotic Quality: Adherent Slough Tendon Exposed: No Muscle Exposed: No Joint Exposed: No Bone Exposed: No Treatment Notes Wound #1 (Left, Medial, Plantar Foot) 1. Cleanse With Wound Cleanser 2. Periwound Care Moisturizing lotion 3. Primary Dressing  Applied Other primary dressing (specifiy in notes) Hydrofera Blue 4. Secondary Dressing Dry Gauze Roll Gauze 5. Secured With Tape 7. Footwear/Offloading device applied T Contact Cast otal Notes Gentamicin under Hydrofera Electronic Signature(s) Signed: 04/17/2020 5:28:47 PM By: [name] RN, BSN Signed: 04/17/2020 5:39:04 PM By: Deon Pilling Entered By: Deon Pilling on 04/17/2020 13:14:28 -------------------------------------------------------------------------------- Vitals Details Patient Name: Date of Service: Wurtzel, Congers RA E. 04/17/2020 12:45 PM Medical Record Number: [id number] Patient Account Number: [id number] Date of Birth/Sex: Treating RN: May 02, 1946 (74 y.o. [name] [name] Primary Care Maygen Sirico: [name] Other Clinician: Referring Fern Canova: Treating Ely Spragg/Extender: Adele Schilder in Treatment: 13 Vital Signs Time Taken: 13:05 Temperature (F): 98.5 Height (in): 66 Pulse (bpm): 109 Weight (lbs): 245 Respiratory Rate (breaths/min): 18 Body Mass Index (BMI): 39.5 Blood Pressure (mmHg): 144/82 Reference Range: 80 - 120 mg / dl Electronic Signature(s) Signed: 04/18/2020 1:36:06 PM By: Sandre Kitty Entered By: Sandre Kitty on 04/17/2020 13:05:39

## 2020-04-18 NOTE — Telephone Encounter (Signed)
Agree with reaching out to [facility] for their recommendation.  Ultimately may need to switch to a [employer] BiPAP unit.  In the interim, the patient needs treatment and probably should continue as long as her machine is working appropriately until we hear the final answer

## 2020-04-18 NOTE — Telephone Encounter (Signed)
Patient called stating that she has a [employer] device and wants to know  If she should continue using her machine.. She states when she reached out to the [employer] company she got a recording. I told her that I would need to defer this question to Dr Claiborne Billings to answer. In the interim I would reach out to the local [employer] to see if I can find out any information for her and call her back.

## 2020-04-18 NOTE — Telephone Encounter (Signed)
Called patient and left message that I reached out to [place] at the local [employer]. She gave me a phone number for the patient to contact [employer]. Phone number provided to patient. [phone]).

## 2020-04-19 NOTE — Telephone Encounter (Signed)
Called patient and gave her Dr Evette Georges recommendation that she should continue using her curent BIPAP machine until she gets a resolution from [employer]. I confirmed that she received my message from yesterday that provided her with a contact # for [employer]. She did not. Number provided to her again.

## 2020-04-21 ENCOUNTER — Encounter: Payer: Self-pay | Admitting: Internal Medicine

## 2020-04-21 ENCOUNTER — Other Ambulatory Visit: Payer: Self-pay | Admitting: Internal Medicine

## 2020-04-21 MED ORDER — ALLOPURINOL 100 MG PO TABS: 100.0000 mg | ORAL_TABLET | Freq: Two times a day (BID) | ORAL | 3 refills | Status: DC

## 2020-04-21 MED ORDER — CITALOPRAM HYDROBROMIDE 40 MG PO TABS: 40.0000 mg | ORAL_TABLET | Freq: Every day | ORAL | 1 refills | Status: DC

## 2020-04-21 MED ORDER — POTASSIUM CHLORIDE CRYS ER 10 MEQ PO TBCR: 20.0000 meq | EXTENDED_RELEASE_TABLET | Freq: Every day | ORAL | 1 refills | Status: DC

## 2020-04-21 MED ORDER — CYCLOBENZAPRINE HCL 10 MG PO TABS: 10.0000 mg | ORAL_TABLET | Freq: Every day | ORAL | 1 refills | Status: DC

## 2020-04-24 ENCOUNTER — Encounter (HOSPITAL_BASED_OUTPATIENT_CLINIC_OR_DEPARTMENT_OTHER): Payer: Medicare Other | Admitting: Physician Assistant

## 2020-04-24 ENCOUNTER — Other Ambulatory Visit: Payer: Self-pay

## 2020-04-24 DIAGNOSIS — G4730 Sleep apnea, unspecified: Secondary | ICD-10-CM | POA: Diagnosis not present

## 2020-04-24 DIAGNOSIS — E11621 Type 2 diabetes mellitus with foot ulcer: Secondary | ICD-10-CM | POA: Diagnosis not present

## 2020-04-24 DIAGNOSIS — I10 Essential (primary) hypertension: Secondary | ICD-10-CM | POA: Diagnosis not present

## 2020-04-24 DIAGNOSIS — I890 Lymphedema, not elsewhere classified: Secondary | ICD-10-CM | POA: Diagnosis not present

## 2020-04-24 DIAGNOSIS — E1140 Type 2 diabetes mellitus with diabetic neuropathy, unspecified: Secondary | ICD-10-CM | POA: Diagnosis not present

## 2020-04-24 DIAGNOSIS — L97522 Non-pressure chronic ulcer of other part of left foot with fat layer exposed: Secondary | ICD-10-CM | POA: Diagnosis not present

## 2020-04-24 NOTE — Progress Notes (Addendum)
[name] [name], [name] [name] ([id number]) Visit Report for 04/24/2020 Chief Complaint Document Details Patient Name: Date of Service: [name] [name], [name] E. 04/24/2020 3:00 PM Medical Record Number: [id number] Patient Account Number: [id number] Date of Birth/Sex: Treating RN: 12/14/1945 (74 y.o. [name] Other Clinician: Referring Provider: Treating Provider/Extender: Adele Schilder in Treatment: 14 Information Obtained from: Patient Chief Complaint Left foot ulcer Electronic Signature(s) Signed: 04/24/2020 3:02:32 PM By: Worthy Keeler PA-C Entered By: Worthy Keeler on 04/24/2020 15:02:32 -------------------------------------------------------------------------------- Debridement Details Patient Name: Date of Service: [name] Amato RA E. 04/24/2020 3:00 PM Medical Record Number: [id number] Patient Account Number: [id number] Date of Birth/Sex: Treating RN: [birth year] (74 y.o. [name] Other Clinician: Referring Provider: Treating Provider/Extender: Adele Schilder in Treatment: 14 Debridement Performed for Assessment: Wound #1 Left,Medial,Plantar Foot Performed By: Physician Worthy Keeler, PA Debridement Type: Debridement Severity of Tissue Pre Debridement: Fat layer exposed Level of Consciousness (Pre-procedure): Awake and Alert Pre-procedure Verification/Time Out Yes - 16:10 Taken: Start Time: 16:11 Pain Control: Lidocaine 5% topical ointment T Area Debrided (L x W): otal 1.5 (cm) x 1.5 (cm) = 2.25 (cm) Tissue and other material debrided: Viable, Non-Viable, Callus, Slough, Subcutaneous, Skin: Epidermis, Slough Level: Skin/Subcutaneous Tissue Debridement Description: Excisional Instrument: Curette Bleeding: Minimum Hemostasis Achieved: Pressure End Time: 16:17 Procedural Pain: 0 Post Procedural Pain: 0 Response to Treatment: Procedure was tolerated  well Level of Consciousness (Post- Awake and Alert procedure): Post Debridement Measurements of Total Wound Length: (cm) 0.6 Width: (cm) 0.7 Depth: (cm) 0.2 Volume: (cm) 0.066 Character of Wound/Ulcer Post Debridement: Improved Severity of Tissue Post Debridement: Fat layer exposed Post Procedure Diagnosis Same as Pre-procedure Electronic Signature(s) Signed: 04/24/2020 6:07:28 PM By: [name] RN, BSN Signed: 04/24/2020 6:26:36 PM By: Worthy Keeler PA-C Entered By: [name] on 04/24/2020 16:15:38 -------------------------------------------------------------------------------- HPI Details Patient Name: Date of Service: [name] [name], [name] RA E. 04/24/2020 3:00 PM Medical Record Number: [id number] Patient Account Number: [id number] Date of Birth/Sex: Treating RN: [birth year] (74 y.o. [name] Other Clinician: Referring Provider: Treating Provider/Extender: Adele Schilder in Treatment: 14 History of Present Illness HPI Description: 01/17/2020 upon evaluation today patient presents for initial evaluation here in our clinic concerning issues she has been having with a left medial/plantar foot ulcer. This is actually been an issue for her since October 2020. She has been seeing Dr. Doran Durand for quite some time during that course. Fortunately there is no signs of active infection at this time. Or least no mention of this to have seen in general. With that being said unfortunately I do see some signs of erythema noted today that does have me concerned about the possibility of infection at this point in the surrounding area of the wound. There is also a warm to touch at the site which is somewhat concerning. Fortunately there is no evidence of systemic infection which is great news. The patient does have a history of diabetes mellitus type 2, Charcot foot which is what led to the wound, and hypertension. She notes that she was  in a cast for some time with Dr. Doran Durand for about 8 weeks. During that time they were utilizing according to the patient silver nitrate along with a foam doughnut and then Coban to secure in place in the cast in place. With that being said I do not have the actual  records to review we are going to try to get a hold of those unfortunately they would not flow over into care everywhere I did look today. She has been seeing Dr. Doran Durand and his physician assistant Larkin Ina up until the end of May and apparently is still seeing them on a regular basis every 2 weeks roughly. She has also tried Iodosorb without effect here. 01/24/2020 upon evaluation today patient actually appears to be doing quite well with regard to her wounds. She has been tolerating the dressing changes without complication. Fortunately there is no signs of active infection spreading which is good news. Her culture did show signs of Staph aureus I did place her on Augmentin due to the erythema surrounding the wound. With that being said the wound does appear to be doing better she has her longer walking cast/boot and I think that is actually good for her for the time being. I am considering reinitiating total contact cast when she gets back from vacation but next week she will actually be out of town at [employer] she knows not to get in the water but she still obviously is planning to enjoy herself she is going to take it easy on her foot however. 02/07/2020 upon evaluation today patient appears to be doing fairly well in regard to her ulcer on her foot. Fortunately there is no signs of severe infection at this time which is great news and overall very pleased in that regard. With that being said I do think that she could still benefit from a total contact cast. Nonetheless she is using her walking boot which at least provide some protection and that it prevents some of the friction occurring when she is ambulating. 02/14/2020 upon evaluation  today patient appears to be doing well with regard to her foot ulcer. This is actually measuring a little bit smaller yet again this week. Overall very pleased with where things stand and I do not see any signs of active infection at this time which is also good news. Since she is measuring better the patient has wanting to somewhat hold off on proceeding with the total contact cast which I think is reasonable at this point. 02/28/2020 on evaluation today patient appears to be doing well in general in regard to her wound although she has a lot of callus buildup as compared to last time I saw her. This is can require sharp debridement today. I do believe she really needs the total contact cast as well which we have discussed previous. 7/23; patient comes in for a total contact cast change 03/06/2020 on evaluation today patient appears to be doing quite well with regard to her wounds. Fortunately the wound bed is measuring smaller and looking much better there is little callus noted although there is some debridement necessary today. 03/13/2020 on evaluation today patient's wound actually appears to be doing excellent which is great news. With that being said unfortunately she is having some issues currently with her left leg where she does have cellulitis it appears. This may have come from an area that rubbed underneath the cast from last week that we noted we padded that area and it looks to be doing excellent at this point but nonetheless the leg was somewhat painful, swollen, and somewhat erythematous. She also had an elevated white blood cell count of 11.5 based on what I saw on looking at her records from the med center in [facility] from where she was seen yesterday. Unfortunately with  [place] having a provider on vacation there was no one here in the clinic in the afternoon when she called therefore she went to the ER as advised. Subsequently they did not cut off the cast as they did not have anyone from  orthopedics there to do so and subsequently also did not have the ability to do the Doppler for evaluation of DVT They recommended therefore given her dose of Eliquis as well as . Augmentin and sent her home to come see [place] today to have the cast taken off and then she is supposed to go back to have the study for DVT performed they are following. 03/20/2020 upon evaluation today patient appears to be doing well with regard to her foot all things considered we have not been able to use the total contact cast due to the infection that she had last week. She has been on the doxycycline and she had a 10-day supply of that I do believe that is helping and her leg appears to be doing better. With that being said there is fortunately no signs of active infection systemically at this time which is good news. No fevers, chills, nausea, vomiting, or diarrhea. 03/27/2020 upon evaluation today patient appears to be doing well with regard to her foot ulcer. There does not appear to be signs of active infection which is great news. Overall I am very pleased with where things stand at this point. 04/03/2020 upon evaluation today patient appears to be doing pretty well in regard to the overall appearance of her wound. Fortunately there is no signs of active infection at this time which is great news. No fevers, chills, nausea, vomiting, or diarrhea. With that being said she does have some blue-green drainage that actually is a little bit concerning to me for the possibility of Pseudomonas. I discussed that with the patient today. With that being said I do believe that we may be able to manage this however with the topical antibiotic cream as opposed to having to do anything oral especially since she seems to be doing so well with overall appearance of the wound. 04/10/2020 on evaluation today patient appears to be doing about the same roughly in regard to the overall size of her wound. With that being said  she fortunately has not shown any signs of worsening overall which is good news. I do believe that she is doing a great job trying to offload but again she may still do better with the cast. I do not see in the blue-green drainage that we noticed previously I do believe the gentamicin help in this regard. 04/17/2020 on evaluation today patient's wound appears to be doing about the same at this point. There is no significant improvement at this point. No fever chills noted. She is up for put the cast back on the day. That she states in a couple weeks she will need to have this off to go to a workshop. 04/24/2020 on evaluation today patient appears to be doing significantly better in regard to her wound. Fortunately there is no signs of active infection and overall feel like she is making great progress the cast seems to have done excellent for her. Electronic Signature(s) Signed: 04/24/2020 4:18:49 PM By: Worthy Keeler PA-C Entered By: Worthy Keeler on 04/24/2020 16:18:49 -------------------------------------------------------------------------------- Physical Exam Details Patient Name: Date of Service: [name] [name], [name] RA E. 04/24/2020 3:00 PM Medical Record Number: [id number] Patient Account Number: [id number] Date of Birth/Sex: Treating RN: 08-30-1945 (73 y.o.  [name] Other Clinician: Referring Provider: Treating Provider/Extender: Landis Martins Weeks in Treatment: 38 Constitutional Well-nourished and well-hydrated in no acute distress. Respiratory normal breathing without difficulty. Psychiatric this patient is able to make decisions and demonstrates good insight into disease process. Alert and Oriented x 3. pleasant and cooperative. Notes Wound bed showed signs of good granulation with some slough noted on the base of the wound I am to perform sharp debridement to clear this way in preparation for reapplying the dressing and  cast were using gentamicin and a small amount followed by [facility] and then again the total contact cast which I did reapply myself today as well Electronic Signature(s) Signed: 04/24/2020 4:19:09 PM By: Worthy Keeler PA-C Entered By: Worthy Keeler on 04/24/2020 16:19:08 -------------------------------------------------------------------------------- Physician Orders Details Patient Name: Date of Service: [name] [name], [name] RA E. 04/24/2020 3:00 PM Medical Record Number: [id number] Patient Account Number: [id number] Date of Birth/Sex: Treating RN: [birth year] (74 y.o. [name] Other Clinician: Referring Provider: Treating Provider/Extender: Adele Schilder in Treatment: [phone] Verbal / Phone Orders: No Diagnosis Coding ICD-10 Coding Code Description E11.621 Type 2 diabetes mellitus with foot ulcer L97.522 Non-pressure chronic ulcer of other part of left foot with fat layer exposed M14.672 Charcot's joint, left ankle and foot E11.40 Type 2 diabetes mellitus with diabetic neuropathy, unspecified I10 Essential (primary) hypertension Follow-up Appointments ppointment in 1 week. - with Margarita Grizzle Return A Dressing Change Frequency Wound #1 Left,Medial,Plantar Foot Do not change entire dressing for one week. Skin Barriers/Peri-Wound Care Moisturizing lotion - to foot and leg with dressing changes Wound Cleansing Wound #1 Left,Medial,Plantar Foot May shower with protection. Primary Wound Dressing Wound #1 Left,Medial,Plantar Foot Hydrofera Blue - ready, cut to fit inside wound margins Other: - gentamycin ointment to wound bed under hydrofera Secondary Dressing Wound #1 Left,Medial,Plantar Foot Foam - donut Kerlix/Rolled Gauze Dry Gauze Edema Control Avoid standing for long periods of time Elevate legs to the level of the heart or above for 30 minutes daily and/or when sitting, a frequency of: Off-Loading Total Contact  Cast to Left Lower Extremity Electronic Signature(s) Signed: 04/24/2020 6:07:28 PM By: [name] RN, BSN Signed: 04/24/2020 6:26:36 PM By: Worthy Keeler PA-C Entered By: [name] on 04/24/2020 16:16:30 -------------------------------------------------------------------------------- Problem List Details Patient Name: Date of Service: [name] Amato RA E. 04/24/2020 3:00 PM Medical Record Number: [id number] Patient Account Number: [id number] Date of Birth/Sex: Treating RN: Dec 10, 1945 (74 y.o. [name] Other Clinician: Referring Provider: Treating Provider/Extender: Adele Schilder in Treatment: [phone] Active Problems ICD-10 Encounter Code Description Active Date MDM Diagnosis E11.621 Type 2 diabetes mellitus with foot ulcer 01/17/2020 No Yes L97.522 Non-pressure chronic ulcer of other part of left foot with fat layer exposed 01/17/2020 No Yes M14.672 Charcot's joint, left ankle and foot 01/17/2020 No Yes E11.40 Type 2 diabetes mellitus with diabetic neuropathy, unspecified 01/17/2020 No Yes I10 Essential (primary) hypertension 01/17/2020 No Yes Inactive Problems Resolved Problems Electronic Signature(s) Signed: 04/24/2020 3:02:24 PM By: Worthy Keeler PA-C Entered By: Worthy Keeler on 04/24/2020 15:02:23 -------------------------------------------------------------------------------- Progress Note Details Patient Name: Date of Service: [name] [name], [name] RA E. 04/24/2020 3:00 PM Medical Record Number: [id number] Patient Account Number: [id number] Date of Birth/Sex: Treating RN: 1946/02/16 (74 y.o. [name] Other Clinician: Referring Provider: Treating Provider/Extender: Earlean Shawl,  Stacy Weeks in Treatment: 14 Subjective Chief Complaint Information obtained from Patient Left foot ulcer History of Present Illness (HPI) 01/17/2020 upon evaluation today patient  presents for initial evaluation here in our clinic concerning issues she has been having with a left medial/plantar foot ulcer. This is actually been an issue for her since October 2020. She has been seeing Dr. Doran Durand for quite some time during that course. Fortunately there is no signs of active infection at this time. Or least no mention of this to have seen in general. With that being said unfortunately I do see some signs of erythema noted today that does have me concerned about the possibility of infection at this point in the surrounding area of the wound. There is also a warm to touch at the site which is somewhat concerning. Fortunately there is no evidence of systemic infection which is great news. The patient does have a history of diabetes mellitus type 2, Charcot foot which is what led to the wound, and hypertension. She notes that she was in a cast for some time with Dr. Doran Durand for about 8 weeks. During that time they were utilizing according to the patient silver nitrate along with a foam doughnut and then Coban to secure in place in the cast in place. With that being said I do not have the actual records to review we are going to try to get a hold of those unfortunately they would not flow over into care everywhere I did look today. She has been seeing Dr. Doran Durand and his physician assistant Larkin Ina up until the end of May and apparently is still seeing them on a regular basis every 2 weeks roughly. She has also tried Iodosorb without effect here. 01/24/2020 upon evaluation today patient actually appears to be doing quite well with regard to her wounds. She has been tolerating the dressing changes without complication. Fortunately there is no signs of active infection spreading which is good news. Her culture did show signs of Staph aureus I did place her on Augmentin due to the erythema surrounding the wound. With that being said the wound does appear to be doing better she has her longer  walking cast/boot and I think that is actually good for her for the time being. I am considering reinitiating total contact cast when she gets back from vacation but next week she will actually be out of town at [employer] she knows not to get in the water but she still obviously is planning to enjoy herself she is going to take it easy on her foot however. 02/07/2020 upon evaluation today patient appears to be doing fairly well in regard to her ulcer on her foot. Fortunately there is no signs of severe infection at this time which is great news and overall very pleased in that regard. With that being said I do think that she could still benefit from a total contact cast. Nonetheless she is using her walking boot which at least provide some protection and that it prevents some of the friction occurring when she is ambulating. 02/14/2020 upon evaluation today patient appears to be doing well with regard to her foot ulcer. This is actually measuring a little bit smaller yet again this week. Overall very pleased with where things stand and I do not see any signs of active infection at this time which is also good news. Since she is measuring better the patient has wanting to somewhat hold off on proceeding with the total contact cast  which I think is reasonable at this point. 02/28/2020 on evaluation today patient appears to be doing well in general in regard to her wound although she has a lot of callus buildup as compared to last time I saw her. This is can require sharp debridement today. I do believe she really needs the total contact cast as well which we have discussed previous. 7/23; patient comes in for a total contact cast change 03/06/2020 on evaluation today patient appears to be doing quite well with regard to her wounds. Fortunately the wound bed is measuring smaller and looking much better there is little callus noted although there is some debridement necessary today. 03/13/2020 on evaluation  today patient's wound actually appears to be doing excellent which is great news. With that being said unfortunately she is having some issues currently with her left leg where she does have cellulitis it appears. This may have come from an area that rubbed underneath the cast from last week that we noted we padded that area and it looks to be doing excellent at this point but nonetheless the leg was somewhat painful, swollen, and somewhat erythematous. She also had an elevated white blood cell count of 11.5 based on what I saw on looking at her records from the med center in [facility] from where she was seen yesterday. Unfortunately with [place] having a provider on vacation there was no one here in the clinic in the afternoon when she called therefore she went to the ER as advised. Subsequently they did not cut off the cast as they did not have anyone from orthopedics there to do so and subsequently also did not have the ability to do the Doppler for evaluation of DVT They recommended therefore given her dose of Eliquis as well as . Augmentin and sent her home to come see [place] today to have the cast taken off and then she is supposed to go back to have the study for DVT performed they are following. 03/20/2020 upon evaluation today patient appears to be doing well with regard to her foot all things considered we have not been able to use the total contact cast due to the infection that she had last week. She has been on the doxycycline and she had a 10-day supply of that I do believe that is helping and her leg appears to be doing better. With that being said there is fortunately no signs of active infection systemically at this time which is good news. No fevers, chills, nausea, vomiting, or diarrhea. 03/27/2020 upon evaluation today patient appears to be doing well with regard to her foot ulcer. There does not appear to be signs of active infection which is great news. Overall I am very pleased with where  things stand at this point. 04/03/2020 upon evaluation today patient appears to be doing pretty well in regard to the overall appearance of her wound. Fortunately there is no signs of active infection at this time which is great news. No fevers, chills, nausea, vomiting, or diarrhea. With that being said she does have some blue-green drainage that actually is a little bit concerning to me for the possibility of Pseudomonas. I discussed that with the patient today. With that being said I do believe that we may be able to manage this however with the topical antibiotic cream as opposed to having to do anything oral especially since she seems to be doing so well with overall appearance of the wound. 04/10/2020 on evaluation today patient  appears to be doing about the same roughly in regard to the overall size of her wound. With that being said she fortunately has not shown any signs of worsening overall which is good news. I do believe that she is doing a great job trying to offload but again she may still do better with the cast. I do not see in the blue-green drainage that we noticed previously I do believe the gentamicin help in this regard. 04/17/2020 on evaluation today patient's wound appears to be doing about the same at this point. There is no significant improvement at this point. No fever chills noted. She is up for put the cast back on the day. That she states in a couple weeks she will need to have this off to go to a workshop. 04/24/2020 on evaluation today patient appears to be doing significantly better in regard to her wound. Fortunately there is no signs of active infection and overall feel like she is making great progress the cast seems to have done excellent for her. Objective Constitutional Well-nourished and well-hydrated in no acute distress. Vitals Time Taken: 3:50 PM, Height: 66 in, Weight: 245 lbs, BMI: 39.5, Temperature: 98.7 F, Pulse: 103 bpm, Respiratory Rate: 18 breaths/min,  Blood Pressure: 142/84 mmHg, Capillary Blood Glucose: 140 mg/dl. General Notes: glucose per pt report Respiratory normal breathing without difficulty. Psychiatric this patient is able to make decisions and demonstrates good insight into disease process. Alert and Oriented x 3. pleasant and cooperative. General Notes: Wound bed showed signs of good granulation with some slough noted on the base of the wound I am to perform sharp debridement to clear this way in preparation for reapplying the dressing and cast were using gentamicin and a small amount followed by Hydrofera Blue and then again the total contact cast which I did reapply myself today as well Integumentary (Hair, Skin) Wound #1 status is Open. Original cause of wound was Gradually Appeared. The wound is located on the [place]. The wound measures 0.6cm length x 0.7cm width x 0.2cm depth; 0.33cm^2 area and 0.066cm^3 volume. There is Fat Layer (Subcutaneous Tissue) exposed. There is no tunneling or undermining noted. There is a small amount of serous drainage noted. The wound margin is well defined and not attached to the wound base. There is large (67- 100%) pink granulation within the wound bed. There is a small (1-33%) amount of necrotic tissue within the wound bed including Adherent Slough. Assessment Active Problems ICD-10 Type 2 diabetes mellitus with foot ulcer Non-pressure chronic ulcer of other part of left foot with fat layer exposed Charcot's joint, left ankle and foot Type 2 diabetes mellitus with diabetic neuropathy, unspecified Essential (primary) hypertension Procedures Wound #1 Pre-procedure diagnosis of Wound #1 is a Diabetic Wound/Ulcer of the Lower Extremity located on the Left,Medial,Plantar Foot .Severity of Tissue Pre Debridement is: Fat layer exposed. There was a Excisional Skin/Subcutaneous Tissue Debridement with a total area of 2.25 sq cm performed by Worthy Keeler, PA. With the following  instrument(s): Curette to remove Viable and Non-Viable tissue/material. Material removed includes Callus, Subcutaneous Tissue, Slough, and Skin: Epidermis after achieving pain control using Lidocaine 5% topical ointment. No specimens were taken. A time out was conducted at 16:10, prior to the start of the procedure. A Minimum amount of bleeding was controlled with Pressure. The procedure was tolerated well with a pain level of 0 throughout and a pain level of 0 following the procedure. Post Debridement Measurements: 0.6cm length x 0.7cm width x  0.2cm depth; 0.066cm^3 volume. Character of Wound/Ulcer Post Debridement is improved. Severity of Tissue Post Debridement is: Fat layer exposed. Post procedure Diagnosis Wound #1: Same as Pre-Procedure Pre-procedure diagnosis of Wound #1 is a Diabetic Wound/Ulcer of the Lower Extremity located on the Left,Medial,Plantar Foot . There was a T Contact otal Cast Procedure by Worthy Keeler, PA. Post procedure Diagnosis Wound #1: Same as Pre-Procedure Plan Follow-up Appointments: Return Appointment in 1 week. - with Margarita Grizzle Dressing Change Frequency: Wound #1 Left,Medial,Plantar Foot: Do not change entire dressing for one week. Skin Barriers/Peri-Wound Care: Moisturizing lotion - to foot and leg with dressing changes Wound Cleansing: Wound #1 Left,Medial,Plantar Foot: May shower with protection. Primary Wound Dressing: Wound #1 Left,Medial,Plantar Foot: Hydrofera Blue - ready, cut to fit inside wound margins Other: - gentamycin ointment to wound bed under hydrofera Secondary Dressing: Wound #1 Left,Medial,Plantar Foot: Foam - donut Kerlix/Rolled Gauze Dry Gauze Edema Control: Avoid standing for long periods of time Elevate legs to the level of the heart or above for 30 minutes daily and/or when sitting, a frequency of: Off-Loading: T Contact Cast to Left Lower Extremity otal 1. I would recommend that we continue with the wound care measures as  before using the [facility] with gentamicin underneath. 2. I am also can recommend that we continue with a total contact cast which has been extremely well for her we will continue as such. 3. I would also recommend that she continue to avoid walking any that she does not have to and we will subsequently see where things stand next week. Obviously we are can be pausing the cast next week in order for her to go to her conference and then following we will resume hopefully to completion. We will see patient back for reevaluation in 1 week here in the clinic. If anything worsens or changes patient will contact our office for additional recommendations. Electronic Signature(s) Signed: 04/24/2020 4:19:45 PM By: Worthy Keeler PA-C Entered By: Worthy Keeler on 04/24/2020 16:19:44 -------------------------------------------------------------------------------- Total Contact Cast Details Patient Name: Date of Service: [name] [name], [name] E. 04/24/2020 3:00 PM Medical Record Number: [id number] Patient Account Number: [id number] Date of Birth/Sex: Treating RN: 1946/03/18 (74 y.o. [name] Other Clinician: Referring Provider: Treating Provider/Extender: Adele Schilder in Treatment: [phone] T Contact Cast Applied for Wound Assessment: otal Wound #1 Left,Medial,Plantar Foot Performed By: Physician Worthy Keeler, PA Post Procedure Diagnosis Same as Pre-procedure Electronic Signature(s) Signed: 04/24/2020 6:07:28 PM By: [name] RN, BSN Signed: 04/24/2020 6:26:36 PM By: Worthy Keeler PA-C Entered By: [name] on 04/24/2020 16:13:59 -------------------------------------------------------------------------------- SuperBill Details Patient Name: Date of Service: [name] Guarneri RBA RA E. 04/24/2020 Medical Record Number: [id number] Patient Account Number: [id number] Date of Birth/Sex: Treating RN: 1946/04/28 (74 y.o. [name] Other Clinician: Referring Provider: Treating Provider/Extender: Adele Schilder in Treatment: 14 Diagnosis Coding ICD-10 Codes Code Description E11.621 Type 2 diabetes mellitus with foot ulcer L97.522 Non-pressure chronic ulcer of other part of left foot with fat layer exposed M14.672 Charcot's joint, left ankle and foot E11.40 Type 2 diabetes mellitus with diabetic neuropathy, unspecified I10 Essential (primary) hypertension Facility Procedures CPT4 Code: 09628366 Description: 29476 - DEB SUBQ TISSUE 20 SQ CM/< ICD-10 Diagnosis Description L97.522 Non-pressure chronic ulcer of other part of left foot with fat layer exposed Modifier: Quantity: 1 Physician Procedures : CPT4 Code Description Modifier [id number] - WC  PHYS SUBQ TISS 20 SQ CM ICD-10 Diagnosis Description L97.522 Non-pressure chronic ulcer of other part of left foot with fat layer exposed Quantity: 1 Electronic Signature(s) Signed: 04/24/2020 4:19:51 PM By: Worthy Keeler PA-C Entered By: Worthy Keeler on 04/24/2020 16:19:50

## 2020-04-24 NOTE — Progress Notes (Signed)
[name], [name] ([id number]) Visit Report for 04/24/2020 Arrival Information Details Patient Name: Date of Service: [name] [name], [name] [name] E. 04/24/2020 3:00 PM Medical Record Number: [id number] Patient Account Number: [id number] Date of Birth/Sex: Treating RN: 28-Mar-1946 (74 y.o. [name] Primary Care Cincere Deprey: [name] Other Clinician: Referring Yehoshua Vitelli: Treating Jarod Bozzo/Extender: Adele Schilder in Treatment: 14 Visit Information History Since Last Visit Added or deleted any medications: No Patient Arrived: Wheel Chair Any new allergies or adverse reactions: No Arrival Time: 15:50 Had a fall or experienced change in No Accompanied By: alone activities of daily living that may affect Transfer Assistance: None risk of falls: Patient Identification Verified: Yes Signs or symptoms of abuse/neglect since last visito No Secondary Verification Process Completed: Yes Hospitalized since last visit: No Patient Requires Transmission-Based Precautions: No Implantable device outside of the clinic excluding No Patient Has Alerts: No cellular tissue based products placed in the center since last visit: Has Dressing in Place as Prescribed: Yes Has Footwear/Offloading in Place as Prescribed: Yes Left: T Contact Cast otal Pain Present Now: No Electronic Signature(s) Signed: 04/24/2020 5:57:30 PM By: Levan Hurst RN, BSN Entered By: Levan Hurst on 04/24/2020 15:50:51 -------------------------------------------------------------------------------- Encounter Discharge Information Details Patient Name: Date of Service: [name] Guarneri RBA [name] E. 04/24/2020 3:00 PM Medical Record Number: [id number] Patient Account Number: [id number] Date of Birth/Sex: Treating RN: May 05, 1946 (74 y.o. [name] Primary Care Anabell Swint: [name] Other Clinician: Referring Nechelle Petrizzo: Treating Stalin Gruenberg/Extender: Adele Schilder in Treatment: 14 Encounter  Discharge Information Items Post Procedure Vitals Discharge Condition: Stable Temperature (F): 98.7 Ambulatory Status: Wheelchair Pulse (bpm): 103 Discharge Destination: Home Respiratory Rate (breaths/min): 18 Transportation: Private Auto Blood Pressure (mmHg): 142/84 Accompanied By: alone Schedule Follow-up Appointment: Yes Clinical Summary of Care: Patient Declined Electronic Signature(s) Signed: 04/24/2020 5:57:30 PM By: Levan Hurst RN, BSN Entered By: Levan Hurst on 04/24/2020 17:49:40 -------------------------------------------------------------------------------- Lower Extremity Assessment Details Patient Name: Date of Service: [name] Amato [name] E. 04/24/2020 3:00 PM Medical Record Number: [id number] Patient Account Number: [id number] Date of Birth/Sex: Treating RN: 03-04-1946 (74 y.o. [name] Primary Care Anvitha Hutmacher: [name] Other Clinician: Referring Reuven Braver: Treating Bertin Inabinet/Extender: Landis Martins Weeks in Treatment: 14 Edema Assessment Assessed: [Left: No] [Right: No] Edema: [Left: Ye] [Right: s] Calf Left: Right: Point of Measurement: 35 cm From Medial Instep 36 cm cm Ankle Left: Right: Point of Measurement: 9 cm From Medial Instep 26 cm cm Vascular Assessment Pulses: Dorsalis Pedis Palpable: [Left:Yes] Electronic Signature(s) Signed: 04/24/2020 5:57:30 PM By: Levan Hurst RN, BSN Entered By: Levan Hurst on 04/24/2020 15:51:29 -------------------------------------------------------------------------------- [place] Details Patient Name: Date of Service: [name] Guarneri RBA [name] E. 04/24/2020 3:00 PM Medical Record Number: [id number] Patient Account Number: [id number] Date of Birth/Sex: Treating RN: 1945/08/13 (74 y.o. [name] Primary Care Marshun Duva: [name] Other Clinician: Referring Zekiah Caruth: Treating Catlynn Grondahl/Extender: Adele Schilder in Treatment: 14 Active  Inactive Nutrition Nursing Diagnoses: Impaired glucose control: actual or potential Potential for alteratiion in Nutrition/Potential for imbalanced nutrition Goals: Patient/caregiver verbalizes understanding of need to maintain therapeutic glucose control per primary care physician Date Initiated: 01/17/2020 Date Inactivated: 02/14/2020 Target Resolution Date: 02/14/2020 Goal Status: Met Patient/caregiver will maintain therapeutic glucose control Date Initiated: 01/17/2020 Target Resolution Date: 05/08/2020 Goal Status: Active Interventions: Assess HgA1c results as ordered upon admission and as needed Assess patient nutrition upon admission and as needed per policy Provide education on elevated blood sugars and impact on  wound healing Treatment Activities: Patient referred to Primary Care Physician for further nutritional evaluation : 01/17/2020 Notes: Wound/Skin Impairment Nursing Diagnoses: Impaired tissue integrity Knowledge deficit related to ulceration/compromised skin integrity Goals: Patient/caregiver will verbalize understanding of skin care regimen Date Initiated: 01/17/2020 Target Resolution Date: 05/08/2020 Goal Status: Active Ulcer/skin breakdown will have a volume reduction of 30% by week 4 Date Initiated: 01/17/2020 Date Inactivated: 02/14/2020 Target Resolution Date: 02/14/2020 Goal Status: Met Ulcer/skin breakdown will have a volume reduction of 50% by week 8 Date Initiated: 02/14/2020 Date Inactivated: 03/13/2020 Target Resolution Date: 03/13/2020 Goal Status: Met Ulcer/skin breakdown will have a volume reduction of 80% by week 12 Date Initiated: 03/13/2020 Date Inactivated: 04/10/2020 Target Resolution Date: 04/10/2020 Goal Status: Unmet Unmet Reason: infection Interventions: Assess patient/caregiver ability to obtain necessary supplies Assess patient/caregiver ability to perform ulcer/skin care regimen upon admission and as needed Assess ulceration(s) every visit Provide  education on ulcer and skin care Treatment Activities: Skin care regimen initiated : 01/17/2020 Topical wound management initiated : 01/17/2020 Notes: Electronic Signature(s) Signed: 04/24/2020 6:07:28 PM By: [name] RN, BSN Entered By: [name] on 04/24/2020 16:13:11 -------------------------------------------------------------------------------- Pain Assessment Details Patient Name: Date of Service: [name] Amato [name] E. 04/24/2020 3:00 PM Medical Record Number: [id number] Patient Account Number: [id number] Date of Birth/Sex: Treating RN: February 25, 1946 (74 y.o. [name] Primary Care Derenda Giddings: [name] Other Clinician: Referring Tye Vigo: Treating Bryttany Tortorelli/Extender: Adele Schilder in Treatment: 14 Active Problems Location of Pain Severity and Description of Pain Patient Has Paino No Site Locations Pain Management and Medication Current Pain Management: Electronic Signature(s) Signed: 04/24/2020 5:57:30 PM By: Levan Hurst RN, BSN Entered By: Levan Hurst on 04/24/2020 15:51:23 -------------------------------------------------------------------------------- Patient/Caregiver Education Details Patient Name: Date of Service: [name] Amato [name] E. 9/15/2021andnbsp3:00 PM Medical Record Number: [id number] Patient Account Number: [id number] Date of Birth/Gender: Treating RN: [birth year] (74 y.o. [name] Primary Care Physician: [name] Other Clinician: Referring Physician: Treating Physician/Extender: Adele Schilder in Treatment: 14 Education Assessment Education Provided To: Patient Education Topics Provided Elevated Blood Sugar/ Impact on Healing: Methods: Explain/Verbal Responses: Reinforcements needed, State content correctly Offloading: Methods: Explain/Verbal Responses: Reinforcements needed, State content correctly Wound/Skin Impairment: Methods: Explain/Verbal Responses: Reinforcements  needed, State content correctly Electronic Signature(s) Signed: 04/24/2020 6:07:28 PM By: [name] RN, BSN Entered By: [name] on 04/24/2020 16:13:41 -------------------------------------------------------------------------------- Wound Assessment Details Patient Name: Date of Service: [name] Amato [name] E. 04/24/2020 3:00 PM Medical Record Number: [id number] Patient Account Number: [id number] Date of Birth/Sex: Treating RN: Jun 19, 1946 (74 y.o. [name] Primary Care Lorenda Grecco: [name] Other Clinician: Referring Adarius Tigges: Treating Furkan Keenum/Extender: Landis Martins Weeks in Treatment: 14 Wound Status Wound Number: 1 Primary Diabetic Wound/Ulcer of the Lower Extremity Etiology: Wound Location: Left, Medial, Plantar Foot Wound Open Wounding Event: Gradually Appeared Status: Date Acquired: 05/11/2019 Comorbid Sleep Apnea, Deep Vein Thrombosis, Hypertension, Colitis, Type Weeks Of Treatment: 14 History: II Diabetes, Gout, Osteoarthritis, Neuropathy Clustered Wound: No Wound Measurements Length: (cm) 0.6 Width: (cm) 0.7 Depth: (cm) 0.2 Area: (cm) 0.33 Volume: (cm) 0.066 % Reduction in Area: 67.7% % Reduction in Volume: 83.8% Epithelialization: Medium (34-66%) Tunneling: No Undermining: No Wound Description Classification: Grade 2 Wound Margin: Well defined, not attached Exudate Amount: Small Exudate Type: Serous Exudate Color: amber Foul Odor After Cleansing: No Slough/Fibrino Yes Wound Bed Granulation Amount: Large (67-100%) Exposed Structure Granulation Quality: Pink Fascia Exposed: No Necrotic Amount: Small (1-33%) Fat Layer (Subcutaneous Tissue) Exposed: Yes Necrotic  Quality: Adherent Slough Tendon Exposed: No Muscle Exposed: No Joint Exposed: No Bone Exposed: No Treatment Notes Wound #1 (Left, Medial, Plantar Foot) 1. Cleanse With Soap and water 3. Primary Dressing Applied Other primary dressing (specifiy in  notes) Hydrofera Blue 4. Secondary Dressing Dry Gauze Roll Gauze 5. Secured With Tape 7. Footwear/Offloading device applied T Contact Cast otal Notes Gentamicin under Hydrofera Electronic Signature(s) Signed: 04/24/2020 5:57:30 PM By: Levan Hurst RN, BSN Entered By: Levan Hurst on 04/24/2020 15:50:12 -------------------------------------------------------------------------------- Vitals Details Patient Name: Date of Service: [place], [place] RBA [name] E. 04/24/2020 3:00 PM Medical Record Number: [id number] Patient Account Number: [id number] Date of Birth/Sex: Treating RN: Aug 21, 1945 (74 y.o. [name] Primary Care Starlett Pehrson: [name] Other Clinician: Referring Malyn Aytes: Treating Elaijah Munoz/Extender: Adele Schilder in Treatment: 14 Vital Signs Time Taken: 15:50 Temperature (F): 98.7 Height (in): 66 Pulse (bpm): 103 Weight (lbs): 245 Respiratory Rate (breaths/min): 18 Body Mass Index (BMI): 39.5 Blood Pressure (mmHg): 142/84 Capillary Blood Glucose (mg/dl): 140 Reference Range: 80 - 120 mg / dl Notes glucose per pt report Electronic Signature(s) Signed: 04/24/2020 5:57:30 PM By: Levan Hurst RN, BSN Entered By: Levan Hurst on 04/24/2020 15:51:18

## 2020-04-26 ENCOUNTER — Encounter (HOSPITAL_BASED_OUTPATIENT_CLINIC_OR_DEPARTMENT_OTHER): Payer: Medicare Other | Admitting: Internal Medicine

## 2020-04-26 DIAGNOSIS — I890 Lymphedema, not elsewhere classified: Secondary | ICD-10-CM | POA: Diagnosis not present

## 2020-04-26 DIAGNOSIS — L97522 Non-pressure chronic ulcer of other part of left foot with fat layer exposed: Secondary | ICD-10-CM | POA: Diagnosis not present

## 2020-04-26 DIAGNOSIS — E1140 Type 2 diabetes mellitus with diabetic neuropathy, unspecified: Secondary | ICD-10-CM | POA: Diagnosis not present

## 2020-04-26 DIAGNOSIS — E11621 Type 2 diabetes mellitus with foot ulcer: Secondary | ICD-10-CM | POA: Diagnosis not present

## 2020-04-26 DIAGNOSIS — G4730 Sleep apnea, unspecified: Secondary | ICD-10-CM | POA: Diagnosis not present

## 2020-04-26 DIAGNOSIS — I10 Essential (primary) hypertension: Secondary | ICD-10-CM | POA: Diagnosis not present

## 2020-04-29 NOTE — Progress Notes (Signed)
AIME, CARRERAS ([id number]) Visit Report for 04/26/2020 SuperBill Details Patient Name: Date of Service: CLAYTON, JARMON RA E. 04/26/2020 Medical Record Number: [id number] Patient Account Number: [id number] Date of Birth/Sex: Treating RN: 1946/05/12 (74 y.o. [name] Primary Care Provider: [name] Other Clinician: Referring Provider: Treating Provider/Extender: Genella Rife in Treatment: 14 Diagnosis Coding ICD-10 Codes Code Description E11.621 Type 2 diabetes mellitus with foot ulcer L97.522 Non-pressure chronic ulcer of other part of left foot with fat layer exposed M14.672 Charcot's joint, left ankle and foot E11.40 Type 2 diabetes mellitus with diabetic neuropathy, unspecified I10 Essential (primary) hypertension Facility Procedures CPT4 Code Description Modifier Quantity [id number] - WOUND CARE VISIT-LEV 3 EST PT 1 Electronic Signature(s) Signed: 04/29/2020 8:08:43 AM By: Linton Ham MD Signed: 04/29/2020 5:09:36 PM By: Levan Hurst RN, BSN Entered By: Levan Hurst on 04/26/2020 11:37:29

## 2020-04-29 NOTE — Progress Notes (Signed)
JAYE, SAAL ([id number]) Visit Report for 04/26/2020 Arrival Information Details Patient Name: Date of Service: ARIAL, GALLIGAN RA E. 04/26/2020 10:45 A M Medical Record Number: [id number] Patient Account Number: [id number] Date of Birth/Sex: Treating RN: 04-24-1946 (74 y.o. [name] Primary Care Aaliyana Fredericks: [name] Other Clinician: Referring Lliam Hoh: Treating Anael Rosch/Extender: Genella Rife in Treatment: 14 Visit Information History Since Last Visit Added or deleted any medications: No Patient Arrived: Wheel Chair Any new allergies or adverse reactions: No Arrival Time: 11:16 Had a fall or experienced change in No Accompanied By: alone activities of daily living that may affect Transfer Assistance: None risk of falls: Patient Requires Transmission-Based Precautions: No Signs or symptoms of abuse/neglect since last visito No Patient Has Alerts: No Hospitalized since last visit: No Implantable device outside of the clinic excluding No cellular tissue based products placed in the center since last visit: Has Dressing in Place as Prescribed: Yes Has Footwear/Offloading in Place as Prescribed: Yes Left: T Contact Cast otal Pain Present Now: No Electronic Signature(s) Signed: 04/29/2020 5:09:36 PM By: Levan Hurst RN, BSN Entered By: Levan Hurst on 04/26/2020 11:17:06 -------------------------------------------------------------------------------- Clinic Level of Care Assessment Details Patient Name: Date of Service: Caplin, [name] 04/26/2020 10:45 A M Medical Record Number: [id number] Patient Account Number: [id number] Date of Birth/Sex: Treating RN: 04-08-1946 (74 y.o. [name] Primary Care Teana Lindahl: [name] Other Clinician: Referring Joe Gee: Treating Garrell Flagg/Extender: Genella Rife in Treatment: 14 Clinic Level of Care Assessment Items TOOL 4 Quantity Score X- 1 0 Use when only an EandM is  performed on FOLLOW-UP visit ASSESSMENTS - Nursing Assessment / Reassessment X- 1 10 Reassessment of Co-morbidities (includes updates in patient status) X- 1 5 Reassessment of Adherence to Treatment Plan ASSESSMENTS - Wound and Skin A ssessment / Reassessment X - Simple Wound Assessment / Reassessment - one wound 1 5 []  - 0 Complex Wound Assessment / Reassessment - multiple wounds []  - 0 Dermatologic / Skin Assessment (not related to wound area) ASSESSMENTS - Focused Assessment []  - 0 Circumferential Edema Measurements - multi extremities []  - 0 Nutritional Assessment / Counseling / Intervention []  - 0 Lower Extremity Assessment (monofilament, tuning fork, pulses) []  - 0 Peripheral Arterial Disease Assessment (using hand held doppler) ASSESSMENTS - Ostomy and/or Continence Assessment and Care []  - 0 Incontinence Assessment and Management []  - 0 Ostomy Care Assessment and Management (repouching, etc.) PROCESS - Coordination of Care X - Simple Patient / Family Education for ongoing care 1 15 []  - 0 Complex (extensive) Patient / Family Education for ongoing care X- 1 10 Staff obtains Programmer, systems, Records, T Results / Process Orders est []  - 0 Staff telephones HHA, Nursing Homes / Clarify orders / etc []  - 0 Routine Transfer to another Facility (non-emergent condition) []  - 0 Routine Hospital Admission (non-emergent condition) []  - 0 New Admissions / Biomedical engineer / Ordering NPWT Apligraf, etc. , []  - 0 Emergency Hospital Admission (emergent condition) X- 1 10 Simple Discharge Coordination []  - 0 Complex (extensive) Discharge Coordination PROCESS - Special Needs []  - 0 Pediatric / Minor Patient Management []  - 0 Isolation Patient Management []  - 0 Hearing / Language / Visual special needs []  - 0 Assessment of Community assistance (transportation, D/C planning, etc.) []  - 0 Additional assistance / Altered mentation []  - 0 Support Surface(s) Assessment  (bed, cushion, seat, etc.) INTERVENTIONS - Wound Cleansing / Measurement X - Simple Wound Cleansing - one wound 1 5 []  -  0 Complex Wound Cleansing - multiple wounds []  - 0 Wound Imaging (photographs - any number of wounds) []  - 0 Wound Tracing (instead of photographs) []  - 0 Simple Wound Measurement - one wound []  - 0 Complex Wound Measurement - multiple wounds INTERVENTIONS - Wound Dressings []  - 0 Small Wound Dressing one or multiple wounds X- 1 15 Medium Wound Dressing one or multiple wounds []  - 0 Large Wound Dressing one or multiple wounds []  - 0 Application of Medications - topical []  - 0 Application of Medications - injection INTERVENTIONS - Miscellaneous []  - 0 External ear exam []  - 0 Specimen Collection (cultures, biopsies, blood, body fluids, etc.) []  - 0 Specimen(s) / Culture(s) sent or taken to Lab for analysis []  - 0 Patient Transfer (multiple staff / Civil Service fast streamer / Similar devices) []  - 0 Simple Staple / Suture removal (25 or less) []  - 0 Complex Staple / Suture removal (26 or more) []  - 0 Hypo / Hyperglycemic Management (close monitor of Blood Glucose) []  - 0 Ankle / Brachial Index (ABI) - do not check if billed separately X- 1 5 Vital Signs Has the patient been seen at the hospital within the last three years: Yes Total Score: 80 Level Of Care: New/Established - Level 3 Electronic Signature(s) Signed: 04/29/2020 5:09:36 PM By: Levan Hurst RN, BSN Entered By: Levan Hurst on 04/26/2020 11:36:33 -------------------------------------------------------------------------------- Encounter Discharge Information Details Patient Name: Date of Service: [name] 04/26/2020 10:45 A M Medical Record Number: [id number] Patient Account Number: [id number] Date of Birth/Sex: Treating RN: 1946/02/16 (74 y.o. [name] Primary Care Kinberly Perris: [name] Other Clinician: Referring Robbye Dede: Treating Saryna Kneeland/Extender: Genella Rife in Treatment: 14 Encounter Discharge Information Items Discharge Condition: Stable Ambulatory Status: Wheelchair Discharge Destination: Home Transportation: Private Auto Accompanied By: alone Schedule Follow-up Appointment: Yes Clinical Summary of Care: Patient Declined Electronic Signature(s) Signed: 04/29/2020 5:09:36 PM By: Levan Hurst RN, BSN Entered By: Levan Hurst on 04/26/2020 11:37:23 -------------------------------------------------------------------------------- Wound Assessment Details Patient Name: Date of Service: [name] 04/26/2020 10:45 A M Medical Record Number: [id number] Patient Account Number: [id number] Date of Birth/Sex: Treating RN: 09/09/1945 (74 y.o. [name] Primary Care Corben Auzenne: [name] Other Clinician: Referring Adoni Greenough: Treating Jory Tanguma/Extender: Genella Rife in Treatment: 14 Wound Status Wound Number: 1 Primary Diabetic Wound/Ulcer of the Lower Extremity Etiology: Wound Location: Left, Medial, Plantar Foot Wound Open Wounding Event: Gradually Appeared Status: Date Acquired: 05/11/2019 Comorbid Sleep Apnea, Deep Vein Thrombosis, Hypertension, Colitis, Type Weeks Of Treatment: 14 History: II Diabetes, Gout, Osteoarthritis, Neuropathy Clustered Wound: No Wound Measurements Length: (cm) 0.6 Width: (cm) 0.7 Depth: (cm) 0.2 Area: (cm) 0.33 Volume: (cm) 0.066 % Reduction in Area: 67.7% % Reduction in Volume: 83.8% Epithelialization: Medium (34-66%) Tunneling: No Undermining: No Wound Description Classification: Grade 2 Wound Margin: Well defined, not attached Exudate Amount: Small Exudate Type: Serous Exudate Color: amber Foul Odor After Cleansing: No Slough/Fibrino Yes Wound Bed Granulation Amount: Large (67-100%) Exposed Structure Granulation Quality: Pink Fascia Exposed: No Necrotic Amount: Small (1-33%) Fat Layer (Subcutaneous Tissue) Exposed: Yes Necrotic Quality:  Adherent Slough Tendon Exposed: No Muscle Exposed: No Joint Exposed: No Bone Exposed: No Treatment Notes Wound #1 (Left, Medial, Plantar Foot) 1. Cleanse With Soap and water 3. Primary Dressing Applied Other primary dressing (specifiy in notes) Hydrofera Blue 4. Secondary Dressing Dry Gauze Roll Gauze Foam 5. Secured With Tape 7. Footwear/Offloading device applied Surgical shoe Notes Gentamicin under Hydrofera Electronic Signature(s)  Signed: 04/29/2020 5:09:36 PM By: Levan Hurst RN, BSN Entered By: Levan Hurst on 04/26/2020 11:35:59 -------------------------------------------------------------------------------- [place] Details Patient Name: Date of Service: Defrancesco, [name] 04/26/2020 10:45 A M Medical Record Number: [id number] Patient Account Number: [id number] Date of Birth/Sex: Treating RN: [birth year] (74 y.o. [name] Primary Care Yonathan Perrow: [name] Other Clinician: Referring Marli Diego: Treating Beadie Matsunaga/Extender: Genella Rife in Treatment: 14 Vital Signs Time Taken: 11:17 Temperature (F): 98.9 Height (in): 66 Pulse (bpm): 103 Weight (lbs): 245 Respiratory Rate (breaths/min): 18 Body Mass Index (BMI): 39.5 Blood Pressure (mmHg): 143/76 Capillary Blood Glucose (mg/dl): 138 Reference Range: 80 - 120 mg / dl Notes glucose per pt report Electronic Signature(s) Signed: 04/29/2020 5:09:36 PM By: Levan Hurst RN, BSN Entered By: Levan Hurst on 04/26/2020 11:17:26

## 2020-04-30 NOTE — Progress Notes (Signed)
[name] [name], [name] [name] ([id number]) Visit Report for 02/28/2020 Arrival Information Details Patient Name: Date of Service: [name] [name], [name] [name] 02/28/2020 3:00 PM Medical Record Number: [id number] Patient Account Number: [id number] Date of Birth/Sex: Treating RN: 1946-07-03 (74 y.o. [name], [name] Primary Care : [name] Other Clinician: Referring : Treating /Extender: Adele Schilder in Treatment: 6 Visit Information History Since Last Visit Added or deleted any medications: No Patient Arrived: [name] [name] Any new allergies or adverse reactions: No Arrival Time: 15:40 Had a fall or experienced change in No Accompanied By: self activities of daily living that may affect Transfer Assistance: None risk of falls: Patient Identification Verified: Yes Signs or symptoms of abuse/neglect since last visito No Secondary Verification Process Completed: Yes Hospitalized since last visit: No Patient Requires Transmission-Based Precautions: No Implantable device outside of the clinic excluding No Patient Has Alerts: No cellular tissue based products placed in the center since last visit: Has Dressing in Place as Prescribed: Yes Pain Present Now: No Electronic Signature(s) Signed: 04/30/2020 8:09:04 AM By: Sandre Kitty Entered By: Sandre Kitty on 02/28/2020 15:40:34 -------------------------------------------------------------------------------- Encounter Discharge Information Details Patient Name: Date of Service: [name] Guarneri RBA RA E. 02/28/2020 3:00 PM Medical Record Number: [id number] Patient Account Number: [id number] Date of Birth/Sex: Treating RN: [birth year] (75 y.o. [name] Primary Care : [name] Other Clinician: Referring : Treating /Extender: Adele Schilder in Treatment: 6 Encounter Discharge Information Items Post Procedure Vitals Discharge Condition: Stable Temperature (F):  98.6 Ambulatory Status: Cane Pulse (bpm): 78 Discharge Destination: Home Respiratory Rate (breaths/min): 19 Transportation: Private Auto Blood Pressure (mmHg): 134/81 Accompanied By: self Schedule Follow-up Appointment: Yes Clinical Summary of Care: Patient Declined Electronic Signature(s) Signed: 03/01/2020 5:42:58 PM By: Carlene Coria RN Entered By: Carlene Coria on 02/28/2020 17:15:13 -------------------------------------------------------------------------------- Lower Extremity Assessment Details Patient Name: Date of Service: [name] [name], [name] E. 02/28/2020 3:00 PM Medical Record Number: [id number] Patient Account Number: [id number] Date of Birth/Sex: Treating RN: [birth year] (74 y.o. [name] [name] Primary Care : [name] Other Clinician: Referring : Treating /Extender: Landis Martins Weeks in Treatment: 6 Edema Assessment Assessed: [Left: No] [Right: No] Edema: [Left: Ye] [Right: s] Calf Left: Right: Point of Measurement: 35 cm From Medial Instep 41 cm cm Ankle Left: Right: Point of Measurement: 9 cm From Medial Instep 24.5 cm cm Vascular Assessment Pulses: Dorsalis Pedis Palpable: [Left:Yes] Electronic Signature(s) Signed: 02/29/2020 5:02:14 PM By: Levan Hurst RN, BSN Entered By: Levan Hurst on 02/28/2020 15:50:47 -------------------------------------------------------------------------------- Multi-Disciplinary Care Plan Details Patient Name: Date of Service: [name] [name] 02/28/2020 3:00 PM Medical Record Number: [id number] Patient Account Number: [id number] Date of Birth/Sex: Treating RN: 11/27/1945 (74 y.o. [name] [name] Primary Care : [name] Other Clinician: Referring : Treating /Extender: Adele Schilder in Treatment: 6 Active Inactive Nutrition Nursing Diagnoses: Impaired glucose control: actual or potential Potential for alteratiion in  Nutrition/Potential for imbalanced nutrition Goals: Patient/caregiver verbalizes understanding of need to maintain therapeutic glucose control per primary care physician Date Initiated: 01/17/2020 Date Inactivated: 02/14/2020 Target Resolution Date: 02/14/2020 Goal Status: Met Patient/caregiver will maintain therapeutic glucose control Date Initiated: 01/17/2020 Target Resolution Date: 03/13/2020 Goal Status: Active Interventions: Assess HgA1c results as ordered upon admission and as needed Assess patient nutrition upon admission and as needed per policy Provide education on elevated blood sugars and impact on wound healing Treatment Activities: Patient referred to Primary Care Physician for further nutritional evaluation : 01/17/2020  Notes: Wound/Skin Impairment Nursing Diagnoses: Impaired tissue integrity Knowledge deficit related to ulceration/compromised skin integrity Goals: Patient/caregiver will verbalize understanding of skin care regimen Date Initiated: 01/17/2020 Target Resolution Date: 03/13/2020 Goal Status: Active Ulcer/skin breakdown will have a volume reduction of 30% by week 4 Date Initiated: 01/17/2020 Date Inactivated: 02/14/2020 Target Resolution Date: 02/14/2020 Goal Status: Met Ulcer/skin breakdown will have a volume reduction of 50% by week 8 Date Initiated: 02/14/2020 Target Resolution Date: 03/13/2020 Goal Status: Active Interventions: Assess patient/caregiver ability to obtain necessary supplies Assess patient/caregiver ability to perform ulcer/skin care regimen upon admission and as needed Assess ulceration(s) every visit Provide education on ulcer and skin care Treatment Activities: Skin care regimen initiated : 01/17/2020 Topical wound management initiated : 01/17/2020 Notes: Electronic Signature(s) Signed: 02/29/2020 4:56:58 PM By: [name] RN, BSN Entered By: [name] on 02/28/2020  15:41:55 -------------------------------------------------------------------------------- Pain Assessment Details Patient Name: Date of Service: [name] [name] 02/28/2020 3:00 PM Medical Record Number: [id number] Patient Account Number: [id number] Date of Birth/Sex: Treating RN: 01-08-1946 (74 y.o. [name] [name] Primary Care Maverik Foot: [name] Other Clinician: Referring Faven Watterson: Treating Roylene Heaton/Extender: Landis Martins Weeks in Treatment: 6 Active Problems Location of Pain Severity and Description of Pain Patient Has Paino No Site Locations Pain Management and Medication Current Pain Management: Electronic Signature(s) Signed: 02/29/2020 4:56:58 PM By: [name] RN, BSN Signed: 04/30/2020 8:09:04 AM By: Sandre Kitty Entered By: Sandre Kitty on 02/28/2020 15:40:57 -------------------------------------------------------------------------------- Patient/Caregiver Education Details Patient Name: Date of Service: [name] [name] 7/21/2021andnbsp3:00 PM Medical Record Number: [id number] Patient Account Number: [id number] Date of Birth/Gender: Treating RN: 1946/06/11 (74 y.o. [name] [name] Primary Care Physician: [name] Other Clinician: Referring Physician: Treating Physician/Extender: Adele Schilder in Treatment: 6 Education Assessment Education Provided To: Patient Education Topics Provided Elevated Blood Sugar/ Impact on Healing: Methods: Explain/Verbal Responses: Reinforcements needed, State content correctly Offloading: Methods: Explain/Verbal Responses: Reinforcements needed, State content correctly Wound/Skin Impairment: Methods: Explain/Verbal Responses: Reinforcements needed, State content correctly Electronic Signature(s) Signed: 02/29/2020 4:56:58 PM By: [name] RN, BSN Entered By: [name] on 02/28/2020  15:42:28 -------------------------------------------------------------------------------- Wound Assessment Details Patient Name: Date of Service: [name] Guarneri RBA RA E. 02/28/2020 3:00 PM Medical Record Number: [id number] Patient Account Number: [id number] Date of Birth/Sex: Treating RN: 1945-08-19 (74 y.o. [name] [name] Primary Care Itzy Adler: [name] Other Clinician: Referring Zai Chmiel: Treating Hashir Deleeuw/Extender: Landis Martins Weeks in Treatment: 6 Wound Status Wound Number: 1 Primary Diabetic Wound/Ulcer of the Lower Extremity Etiology: Wound Location: Left, Medial, Plantar Foot Wound Open Wounding Event: Gradually Appeared Status: Date Acquired: 05/11/2019 Comorbid Sleep Apnea, Deep Vein Thrombosis, Hypertension, Colitis, Type Weeks Of Treatment: 6 History: II Diabetes, Gout, Osteoarthritis, Neuropathy Clustered Wound: No Photos Photo Uploaded By: Mikeal Hawthorne on 02/29/2020 13:50:27 Wound Measurements Length: (cm) 1.1 Width: (cm) 1.1 Depth: (cm) 0.3 Area: (cm) 0.95 Volume: (cm) 0.285 % Reduction in Area: 7% % Reduction in Volume: 30.1% Epithelialization: None Tunneling: No Undermining: No Wound Description Classification: Grade 2 Wound Margin: Thickened Exudate Amount: Medium Exudate Type: Serosanguineous Exudate Color: red, brown Foul Odor After Cleansing: No Slough/Fibrino Yes Wound Bed Granulation Amount: Large (67-100%) Exposed Structure Granulation Quality: Pink Fascia Exposed: No Necrotic Amount: Small (1-33%) Fat Layer (Subcutaneous Tissue) Exposed: Yes Necrotic Quality: Adherent Slough Tendon Exposed: No Muscle Exposed: No Joint Exposed: No Bone Exposed: No Electronic Signature(s) Signed: 02/29/2020 4:56:58 PM By: [name] RN, BSN Signed: 02/29/2020 5:02:14 PM By: Levan Hurst RN,  BSN Entered By: Levan Hurst on 02/28/2020  15:51:05 -------------------------------------------------------------------------------- Vitals Details Patient Name: Date of Service: [name] [name], [name] RA E. 02/28/2020 3:00 PM Medical Record Number: [id number] Patient Account Number: [id number] Date of Birth/Sex: Treating RN: [birth year] (74 y.o. [name] [name] Primary Care Aanika Defoor: [name] Other Clinician: Referring Avalina Benko: Treating Miner Koral/Extender: Landis Martins Weeks in Treatment: 6 Vital Signs Time Taken: 15:41 Temperature (F): 98.6 Height (in): 66 Pulse (bpm): 78 Weight (lbs): 245 Respiratory Rate (breaths/min): 19 Body Mass Index (BMI): 39.5 Blood Pressure (mmHg): 134/81 Reference Range: 80 - 120 mg / dl Electronic Signature(s) Signed: 04/30/2020 8:09:04 AM By: Sandre Kitty Entered By: Sandre Kitty on 02/28/2020 15:40:50

## 2020-05-01 ENCOUNTER — Encounter (HOSPITAL_BASED_OUTPATIENT_CLINIC_OR_DEPARTMENT_OTHER): Payer: Medicare Other | Admitting: Physician Assistant

## 2020-05-01 ENCOUNTER — Other Ambulatory Visit: Payer: Self-pay

## 2020-05-01 DIAGNOSIS — E1140 Type 2 diabetes mellitus with diabetic neuropathy, unspecified: Secondary | ICD-10-CM | POA: Diagnosis not present

## 2020-05-01 DIAGNOSIS — I10 Essential (primary) hypertension: Secondary | ICD-10-CM | POA: Diagnosis not present

## 2020-05-01 DIAGNOSIS — I890 Lymphedema, not elsewhere classified: Secondary | ICD-10-CM | POA: Diagnosis not present

## 2020-05-01 DIAGNOSIS — G4730 Sleep apnea, unspecified: Secondary | ICD-10-CM | POA: Diagnosis not present

## 2020-05-01 DIAGNOSIS — L97522 Non-pressure chronic ulcer of other part of left foot with fat layer exposed: Secondary | ICD-10-CM | POA: Diagnosis not present

## 2020-05-01 DIAGNOSIS — E11621 Type 2 diabetes mellitus with foot ulcer: Secondary | ICD-10-CM | POA: Diagnosis not present

## 2020-05-01 NOTE — Progress Notes (Addendum)
[name] [name], [name] [name] ([id number]) Visit Report for 05/01/2020 Chief Complaint Document Details Patient Name: Date of Service: [name], [name] 05/01/2020 12:45 PM Medical Record Number: [id number] Patient Account Number: [id number] Date of Birth/Sex: Treating RN: 1945/09/19 (74 y.o. [name] [name] Primary Care Provider: Billey [name] Other Clinician: Referring Provider: Treating Provider/Extender: [name] [name] in Treatment: 15 Information Obtained from: Patient Chief Complaint Left foot ulcer Electronic Signature(s) Signed: 05/01/2020 1:15:44 PM By: [name] Keeler PA-C Entered By: [name] [name] on 05/01/2020 13:15:44 -------------------------------------------------------------------------------- Debridement Details Patient Name: Date of Service: [name] Amato RA E. 05/01/2020 12:45 PM Medical Record Number: [id number] Patient Account Number: [id number] Date of Birth/Sex: Treating RN: Apr 06, 1946 (74 y.o. [name] [name] Primary Care Provider: Billey [name] Other Clinician: Referring Provider: Treating Provider/Extender: [name] [name] in Treatment: 15 Debridement Performed for Assessment: Wound #1 Left,Medial,Plantar Foot Performed By: Physician [name] Keeler, PA Debridement Type: Debridement Severity of Tissue Pre Debridement: Fat layer exposed Level of Consciousness (Pre-procedure): Awake and Alert Pre-procedure Verification/Time Out Yes - 14:05 Taken: Start Time: 14:06 T Area Debrided (L x W): otal 0.4 (cm) x 0.5 (cm) = 0.2 (cm) Tissue and other material debrided: Viable, Non-Viable, Callus, Slough, Subcutaneous, Skin: Epidermis, Slough Level: Skin/Subcutaneous Tissue Debridement Description: Excisional Instrument: Curette Bleeding: Minimum Hemostasis Achieved: Pressure End Time: 14:09 Procedural Pain: 0 Post Procedural Pain: 0 Response to Treatment: Procedure was tolerated well Level of Consciousness (Post- Awake and  Alert procedure): Post Debridement Measurements of Total Wound Length: (cm) 0.4 Width: (cm) 0.5 Depth: (cm) 0.4 Volume: (cm) 0.063 Character of Wound/Ulcer Post Debridement: Improved Severity of Tissue Post Debridement: Fat layer exposed Post Procedure Diagnosis Same as Pre-procedure Electronic Signature(s) Signed: 05/01/2020 5:11:27 PM By: [name] Gouty RN, BSN Signed: 05/01/2020 5:16:27 PM By: [name] Keeler PA-C Entered By: [name] [name] on 05/01/2020 14:10:44 -------------------------------------------------------------------------------- HPI Details Patient Name: Date of Service: [name] Guarneri RBA RA E. 05/01/2020 12:45 PM Medical Record Number: [id number] Patient Account Number: [id number] Date of Birth/Sex: Treating RN: 12-11-1945 (74 y.o. [name] [name] Primary Care Provider: Billey [name] Other Clinician: Referring Provider: Treating Provider/Extender: [name] [name] in Treatment: 15 History of Present Illness HPI Description: 01/17/2020 upon evaluation today patient presents for initial evaluation here in our clinic concerning issues she has been having with a left medial/plantar foot ulcer. This is actually been an issue for her since October 2020. She has been seeing Dr. Doran [name] for quite some time during that course. Fortunately there is no signs of active infection at this time. Or least no mention of this to have seen in general. With that being said unfortunately I do see some signs of erythema noted today that does have me concerned about the possibility of infection at this point in the surrounding area of the wound. There is also a warm to touch at the site which is somewhat concerning. Fortunately there is no evidence of systemic infection which is great news. The patient does have a history of diabetes mellitus type 2, Charcot foot which is what led to the wound, and hypertension. She notes that she was in a cast for some time with Dr. Doran [name] for  about 8 weeks. During that time they were utilizing according to the patient silver nitrate along with a foam doughnut and then Coban to secure in place in the cast in place. With that being said I do not have the actual records to review we are going  to try to get a hold of those unfortunately they would not flow over into care everywhere I did look today. She has been seeing Dr. Doran [name] and his physician assistant Larkin Ina up until the end of May and apparently is still seeing them on a regular basis every 2 weeks roughly. She has also tried Iodosorb without effect here. 01/24/2020 upon evaluation today patient actually appears to be doing quite well with regard to her wounds. She has been tolerating the dressing changes without complication. Fortunately there is no signs of active infection spreading which is good news. Her culture did show signs of Staph aureus I did place her on Augmentin due to the erythema surrounding the wound. With that being said the wound does appear to be doing better she has her longer walking cast/boot and I think that is actually good for her for the time being. I am considering reinitiating total contact cast when she gets back from vacation but next week she will actually be out of town at [employer] she knows not to get in the water but she still obviously is planning to enjoy herself she is going to take it easy on her foot however. 02/07/2020 upon evaluation today patient appears to be doing fairly well in regard to her ulcer on her foot. Fortunately there is no signs of severe infection at this time which is great news and overall very pleased in that regard. With that being said I do think that she could still benefit from a total contact cast. Nonetheless she is using her walking boot which at least provide some protection and that it prevents some of the friction occurring when she is ambulating. 02/14/2020 upon evaluation today patient appears to be doing well with  regard to her foot ulcer. This is actually measuring a little bit smaller yet again this week. Overall very pleased with where things stand and I do not see any signs of active infection at this time which is also good news. Since she is measuring better the patient has wanting to somewhat hold off on proceeding with the total contact cast which I think is reasonable at this point. 02/28/2020 on evaluation today patient appears to be doing well in general in regard to her wound although she has a lot of callus buildup as compared to last time I saw her. This is can require sharp debridement today. I do believe she really needs the total contact cast as well which we have discussed previous. 7/23; patient comes in for a total contact cast change 03/06/2020 on evaluation today patient appears to be doing quite well with regard to her wounds. Fortunately the wound bed is measuring smaller and looking much better there is little callus noted although there is some debridement necessary today. 03/13/2020 on evaluation today patient's wound actually appears to be doing excellent which is great news. With that being said unfortunately she is having some issues currently with her left leg where she does have cellulitis it appears. This may have come from an area that rubbed underneath the cast from last week that we noted we padded that area and it looks to be doing excellent at this point but nonetheless the leg was somewhat painful, swollen, and somewhat erythematous. She also had an elevated white blood cell count of 11.5 based on what I saw on looking at her records from the med center in [place] [facility] from where she was seen yesterday. Unfortunately with [place] having a provider on vacation  there was no one here in the clinic in the afternoon when she called therefore she went to the ER as advised. Subsequently they did not cut off the cast as they did not have anyone from orthopedics there to do so and subsequently  also did not have the ability to do the Doppler for evaluation of DVT They recommended therefore given her dose of Eliquis as well as . Augmentin and sent her home to come see [place] today to have the cast taken off and then she is supposed to go back to have the study for DVT performed they are following. 03/20/2020 upon evaluation today patient appears to be doing well with regard to her foot all things considered we have not been able to use the total contact cast due to the infection that she had last week. She has been on the doxycycline and she had a 10-day supply of that I do believe that is helping and her leg appears to be doing better. With that being said there is fortunately no signs of active infection systemically at this time which is good news. No fevers, chills, nausea, vomiting, or diarrhea. 03/27/2020 upon evaluation today patient appears to be doing well with regard to her foot ulcer. There does not appear to be signs of active infection which is great news. Overall I am very pleased with where things stand at this point. 04/03/2020 upon evaluation today patient appears to be doing pretty well in regard to the overall appearance of her wound. Fortunately there is no signs of active infection at this time which is great news. No fevers, chills, nausea, vomiting, or diarrhea. With that being said she does have some blue-green drainage that actually is a little bit concerning to me for the possibility of Pseudomonas. I discussed that with the patient today. With that being said I do believe that we may be able to manage this however with the topical antibiotic cream as opposed to having to do anything oral especially since she seems to be doing so well with overall appearance of the wound. 04/10/2020 on evaluation today patient appears to be doing about the same roughly in regard to the overall size of her wound. With that being said she fortunately has not shown any signs of worsening  overall which is good news. I do believe that she is doing a great job trying to offload but again she may still do better with the cast. I do not see in the blue-green drainage that we noticed previously I do believe the gentamicin help in this regard. 04/17/2020 on evaluation today patient's wound appears to be doing about the same at this point. There is no significant improvement at this point. No fever chills noted. She is up for put the cast back on the day. That she states in a couple weeks she will need to have this off to go to a workshop. 04/24/2020 on evaluation today patient appears to be doing significantly better in regard to her wound. Fortunately there is no signs of active infection and overall feel like she is making great progress the cast seems to have done excellent for her. 05/01/2020 upon evaluation today patient presents for reevaluation she really does not appear to be doing too badly in regard to the actual wound on the left foot we have been managing. Unfortunately she has bilateral lower extremity edema with blisters between the webspace of her first and second toe on both feet. She has a tremendous amount  of edema in the legs which I think is where this is coming from it does not appear to be infected but nonetheless I do believe this is can be something that needs to be addressed today. Obviously this means we probably will not be putting the cast on at this point. She attributes this to the fact that she was sitting with her feet on the floor much longer during a conference last week she had a great time but unfortunately had a lot of complications as a result. Electronic Signature(s) Signed: 05/01/2020 2:13:34 PM By: [name] Keeler PA-C Entered By: [name] [name] on 05/01/2020 14:13:34 -------------------------------------------------------------------------------- Physical Exam Details Patient Name: Date of Service: [name] [name], [name] E. 05/01/2020 12:45 PM Medical Record  Number: [id number] Patient Account Number: [id number] Date of Birth/Sex: Treating RN: Mar 10, 1946 (74 y.o. [name] [name] Primary Care Provider: Billey [name] Other Clinician: Referring Provider: Treating Provider/Extender: Landis Martins Weeks in Treatment: 52 Constitutional Well-nourished and well-hydrated in no acute distress. Respiratory normal breathing without difficulty. Psychiatric this patient is able to make decisions and demonstrates good insight into disease process. Alert and Oriented x 3. pleasant and cooperative. Notes Upon inspection patient's wound bed actually showed signs of fairly good granulation in fact there was some callus around the edges likely that way as well is minimal slough off the surface of the wound post debridement wound bed appears to be doing much better which is great news and overall very pleased with where things stand today. Electronic Signature(s) Signed: 05/01/2020 2:13:53 PM By: [name] Keeler PA-C Entered By: [name] [name] on 05/01/2020 14:13:52 -------------------------------------------------------------------------------- Physician Orders Details Patient Name: Date of Service: [name] [name], [name] RA E. 05/01/2020 12:45 PM Medical Record Number: [id number] Patient Account Number: [id number] Date of Birth/Sex: Treating RN: 1946-02-28 (74 y.o. [name] [name] Primary Care Provider: Billey [name] Other Clinician: Referring Provider: Treating Provider/Extender: [name] [name] in Treatment: 15 Verbal / Phone Orders: No Diagnosis Coding ICD-10 Coding Code Description E11.621 Type 2 diabetes mellitus with foot ulcer L97.522 Non-pressure chronic ulcer of other part of left foot with fat layer exposed M14.672 Charcot's joint, left ankle and foot E11.40 Type 2 diabetes mellitus with diabetic neuropathy, unspecified I10 Essential (primary) hypertension Follow-up Appointments ppointment in 1 week. - with  Margarita Grizzle Return A Dressing Change Frequency Wound #1 Left,Medial,Plantar Foot Do not change entire dressing for one week. Wound #2 Left,Distal,Medial T Second oe Change Dressing every other day. Skin Barriers/Peri-Wound Care Moisturizing lotion - to both lefs Wound Cleansing Wound #1 Left,Medial,Plantar Foot May shower with protection. Primary Wound Dressing Wound #1 Left,Medial,Plantar Foot Calcium A lginate with Silver Other: - gentamycin ointment to wound bed under alginate Wound #2 Left,Distal,Medial T Second oe lginate with Silver - and to blistered toes Calcium A Secondary Dressing Wound #1 Left,Medial,Plantar Foot Foam - donut Kerlix/Rolled Gauze Dry Gauze Edema Control 3 Layer Compression System - Bilateral Avoid standing for long periods of time Elevate legs to the level of the heart or above for 30 minutes daily and/or when sitting, a frequency of: Exercise regularly Off-Loading Open toe surgical shoe to: - left foot Electronic Signature(s) Signed: 05/01/2020 5:11:27 PM By: [name] Gouty RN, BSN Signed: 05/01/2020 5:16:27 PM By: [name] Keeler PA-C Entered By: [name] [name] on 05/01/2020 14:09:13 -------------------------------------------------------------------------------- Problem List Details Patient Name: Date of Service: [name] Amato RA E. 05/01/2020 12:45 PM Medical Record Number: [id number] Patient Account Number: [id number] Date of Birth/Sex: Treating RN: 02-Sep-1945 (  74 y.o. [name] [name] Primary Care Provider: Billey [name] Other Clinician: Referring Provider: Treating Provider/Extender: [name] [name] in Treatment: 15 Active Problems ICD-10 Encounter Code Description Active Date MDM Diagnosis E11.621 Type 2 diabetes mellitus with foot ulcer 01/17/2020 No Yes L97.522 Non-pressure chronic ulcer of other part of left foot with fat layer exposed 01/17/2020 No Yes M14.672 Charcot's joint, left ankle and foot 01/17/2020 No  Yes E11.40 Type 2 diabetes mellitus with diabetic neuropathy, unspecified 01/17/2020 No Yes I10 Essential (primary) hypertension 01/17/2020 No Yes Inactive Problems Resolved Problems Electronic Signature(s) Signed: 05/01/2020 1:15:38 PM By: [name] Keeler PA-C Entered By: [name] [name] on 05/01/2020 13:15:37 -------------------------------------------------------------------------------- Progress Note Details Patient Name: Date of Service: [name] [name], [name] 05/01/2020 12:45 PM Medical Record Number: [id number] Patient Account Number: [id number] Date of Birth/Sex: Treating RN: 08-25-1945 (74 y.o. [name] [name] Primary Care Provider: Billey [name] Other Clinician: Referring Provider: Treating Provider/Extender: [name] [name] in Treatment: 15 Subjective Chief Complaint Information obtained from Patient Left foot ulcer History of Present Illness (HPI) 01/17/2020 upon evaluation today patient presents for initial evaluation here in our clinic concerning issues she has been having with a left medial/plantar foot ulcer. This is actually been an issue for her since October 2020. She has been seeing Dr. Doran [name] for quite some time during that course. Fortunately there is no signs of active infection at this time. Or least no mention of this to have seen in general. With that being said unfortunately I do see some signs of erythema noted today that does have me concerned about the possibility of infection at this point in the surrounding area of the wound. There is also a warm to touch at the site which is somewhat concerning. Fortunately there is no evidence of systemic infection which is great news. The patient does have a history of diabetes mellitus type 2, Charcot foot which is what led to the wound, and hypertension. She notes that she was in a cast for some time with Dr. Doran [name] for about 8 weeks. During that time they were utilizing according to the patient silver  nitrate along with a foam doughnut and then Coban to secure in place in the cast in place. With that being said I do not have the actual records to review we are going to try to get a hold of those unfortunately they would not flow over into care everywhere I did look today. She has been seeing Dr. Doran [name] and his physician assistant Larkin Ina up until the end of May and apparently is still seeing them on a regular basis every 2 weeks roughly. She has also tried Iodosorb without effect here. 01/24/2020 upon evaluation today patient actually appears to be doing quite well with regard to her wounds. She has been tolerating the dressing changes without complication. Fortunately there is no signs of active infection spreading which is good news. Her culture did show signs of Staph aureus I did place her on Augmentin due to the erythema surrounding the wound. With that being said the wound does appear to be doing better she has her longer walking cast/boot and I think that is actually good for her for the time being. I am considering reinitiating total contact cast when she gets back from vacation but next week she will actually be out of town at [employer] she knows not to get in the water but she still obviously is planning to enjoy herself she is  going to take it easy on her foot however. 02/07/2020 upon evaluation today patient appears to be doing fairly well in regard to her ulcer on her foot. Fortunately there is no signs of severe infection at this time which is great news and overall very pleased in that regard. With that being said I do think that she could still benefit from a total contact cast. Nonetheless she is using her walking boot which at least provide some protection and that it prevents some of the friction occurring when she is ambulating. 02/14/2020 upon evaluation today patient appears to be doing well with regard to her foot ulcer. This is actually measuring a little bit smaller yet again this  week. Overall very pleased with where things stand and I do not see any signs of active infection at this time which is also good news. Since she is measuring better the patient has wanting to somewhat hold off on proceeding with the total contact cast which I think is reasonable at this point. 02/28/2020 on evaluation today patient appears to be doing well in general in regard to her wound although she has a lot of callus buildup as compared to last time I saw her. This is can require sharp debridement today. I do believe she really needs the total contact cast as well which we have discussed previous. 7/23; patient comes in for a total contact cast change 03/06/2020 on evaluation today patient appears to be doing quite well with regard to her wounds. Fortunately the wound bed is measuring smaller and looking much better there is little callus noted although there is some debridement necessary today. 03/13/2020 on evaluation today patient's wound actually appears to be doing excellent which is great news. With that being said unfortunately she is having some issues currently with her left leg where she does have cellulitis it appears. This may have come from an area that rubbed underneath the cast from last week that we noted we padded that area and it looks to be doing excellent at this point but nonetheless the leg was somewhat painful, swollen, and somewhat erythematous. She also had an elevated white blood cell count of 11.5 based on what I saw on looking at her records from the med center in [facility] from where she was seen yesterday. Unfortunately with [place] having a provider on vacation there was no one here in the clinic in the afternoon when she called therefore she went to the ER as advised. Subsequently they did not cut off the cast as they did not have anyone from orthopedics there to do so and subsequently also did not have the ability to do the Doppler for evaluation of DVT They recommended  therefore given her dose of Eliquis as well as . Augmentin and sent her home to come see [place] today to have the cast taken off and then she is supposed to go back to have the study for DVT performed they are following. 03/20/2020 upon evaluation today patient appears to be doing well with regard to her foot all things considered we have not been able to use the total contact cast due to the infection that she had last week. She has been on the doxycycline and she had a 10-day supply of that I do believe that is helping and her leg appears to be doing better. With that being said there is fortunately no signs of active infection systemically at this time which is good news. No fevers, chills, nausea, vomiting,  or diarrhea. 03/27/2020 upon evaluation today patient appears to be doing well with regard to her foot ulcer. There does not appear to be signs of active infection which is great news. Overall I am very pleased with where things stand at this point. 04/03/2020 upon evaluation today patient appears to be doing pretty well in regard to the overall appearance of her wound. Fortunately there is no signs of active infection at this time which is great news. No fevers, chills, nausea, vomiting, or diarrhea. With that being said she does have some blue-green drainage that actually is a little bit concerning to me for the possibility of Pseudomonas. I discussed that with the patient today. With that being said I do believe that we may be able to manage this however with the topical antibiotic cream as opposed to having to do anything oral especially since she seems to be doing so well with overall appearance of the wound. 04/10/2020 on evaluation today patient appears to be doing about the same roughly in regard to the overall size of her wound. With that being said she fortunately has not shown any signs of worsening overall which is good news. I do believe that she is doing a great job trying to offload but  again she may still do better with the cast. I do not see in the blue-green drainage that we noticed previously I do believe the gentamicin help in this regard. 04/17/2020 on evaluation today patient's wound appears to be doing about the same at this point. There is no significant improvement at this point. No fever chills noted. She is up for put the cast back on the day. That she states in a couple weeks she will need to have this off to go to a workshop. 04/24/2020 on evaluation today patient appears to be doing significantly better in regard to her wound. Fortunately there is no signs of active infection and overall feel like she is making great progress the cast seems to have done excellent for her. 05/01/2020 upon evaluation today patient presents for reevaluation she really does not appear to be doing too badly in regard to the actual wound on the left foot we have been managing. Unfortunately she has bilateral lower extremity edema with blisters between the webspace of her first and second toe on both feet. She has a tremendous amount of edema in the legs which I think is where this is coming from it does not appear to be infected but nonetheless I do believe this is can be something that needs to be addressed today. Obviously this means we probably will not be putting the cast on at this point. She attributes this to the fact that she was sitting with her feet on the floor much longer during a conference last week she had a great time but unfortunately had a lot of complications as a result. Objective Constitutional Well-nourished and well-hydrated in no acute distress. Vitals Time Taken: 1:15 PM, Height: 66 in, Weight: 245 lbs, BMI: 39.5, Temperature: 98.5 F, Pulse: 99 bpm, Respiratory Rate: 18 breaths/min, Blood Pressure: 149/83 mmHg. Respiratory normal breathing without difficulty. Psychiatric this patient is able to make decisions and demonstrates good insight into disease process.  Alert and Oriented x 3. pleasant and cooperative. General Notes: Upon inspection patient's wound bed actually showed signs of fairly good granulation in fact there was some callus around the edges likely that way as well is minimal slough off the surface of the wound post debridement wound bed  appears to be doing much better which is great news and overall very pleased with where things stand today. Integumentary (Hair, Skin) Wound #1 status is Open. Original cause of wound was Gradually Appeared. The wound is located on the [place]. The wound measures 0.4cm length x 0.5cm width x 0.4cm depth; 0.157cm^2 area and 0.063cm^3 volume. There is Fat Layer (Subcutaneous Tissue) exposed. There is no tunneling or undermining noted. There is a small amount of serous drainage noted. The wound margin is well defined and not attached to the wound base. There is large (67- 100%) pink granulation within the wound bed. There is no necrotic tissue within the wound bed. Wound #2 status is Open. Original cause of wound was Shear/Friction. The wound is located on the Left,Distal,Medial T Second. The wound measures 0.4cm oe length x 0.5cm width x 0.1cm depth; 0.157cm^2 area and 0.016cm^3 volume. There is Fat Layer (Subcutaneous Tissue) exposed. There is no tunneling or undermining noted. There is a medium amount of serosanguineous drainage noted. The wound margin is distinct with the outline attached to the wound base. There is large (67-100%) pink, pale granulation within the wound bed. There is no necrotic tissue within the wound bed. Assessment Active Problems ICD-10 Type 2 diabetes mellitus with foot ulcer Non-pressure chronic ulcer of other part of left foot with fat layer exposed Charcot's joint, left ankle and foot Type 2 diabetes mellitus with diabetic neuropathy, unspecified Essential (primary) hypertension Procedures Wound #1 Pre-procedure diagnosis of Wound #1 is a Diabetic Wound/Ulcer  of the Lower Extremity located on the Left,Medial,Plantar Foot .Severity of Tissue Pre Debridement is: Fat layer exposed. There was a Excisional Skin/Subcutaneous Tissue Debridement with a total area of 0.2 sq cm performed by [name] Keeler, PA. With the following instrument(s): Curette to remove Viable and Non-Viable tissue/material. Material removed includes Callus, Subcutaneous Tissue, Slough, and Skin: Epidermis. No specimens were taken. A time out was conducted at 14:05, prior to the start of the procedure. A Minimum amount of bleeding was controlled with Pressure. The procedure was tolerated well with a pain level of 0 throughout and a pain level of 0 following the procedure. Post Debridement Measurements: 0.4cm length x 0.5cm width x 0.4cm depth; 0.063cm^3 volume. Character of Wound/Ulcer Post Debridement is improved. Severity of Tissue Post Debridement is: Fat layer exposed. Post procedure Diagnosis Wound #1: Same as Pre-Procedure There was a Three Layer Compression Therapy Procedure by Carlene Coria, RN. Post procedure Diagnosis Wound #: Same as Pre-Procedure There was a Three Layer Compression Therapy Procedure by Carlene Coria, RN. Post procedure Diagnosis Wound #: Same as Pre-Procedure Plan Follow-up Appointments: Return Appointment in 1 week. - with Margarita Grizzle Dressing Change Frequency: Wound #1 Left,Medial,Plantar Foot: Do not change entire dressing for one week. Wound #2 Left,Distal,Medial T Second: oe Change Dressing every other day. Skin Barriers/Peri-Wound Care: Moisturizing lotion - to both lefs Wound Cleansing: Wound #1 Left,Medial,Plantar Foot: May shower with protection. Primary Wound Dressing: Wound #1 Left,Medial,Plantar Foot: Calcium Alginate with Silver Other: - gentamycin ointment to wound bed under alginate Wound #2 Left,Distal,Medial T Second: oe Calcium Alginate with Silver - and to blistered toes Secondary Dressing: Wound #1 Left,Medial,Plantar Foot: Foam -  donut Kerlix/Rolled Gauze Dry Gauze Edema Control: 3 Layer Compression System - Bilateral Avoid standing for long periods of time Elevate legs to the level of the heart or above for 30 minutes daily and/or when sitting, a frequency of: Exercise regularly Off-Loading: Open toe surgical shoe to: - left foot 1. I am  going to suggest at this point that we continue with the wound care measures as before specifically with regard to the gentamicin cream. We will actually switch from [facility] to the silver alginate dressing as I think that I do a good job for her at this point. 2. I am also going to suggest currently that no antibiotics seem to be necessary I did rupture the blisters leaving the surface intact however to hopefully allow this to be attached and dry out. I do believe we get the edema in her legs under control this will be okay. 3. I am also going to suggest that the patient minimize her standing walking on her foot. Obviously less she can do I think the better she would do. We will see patient back for reevaluation in 1 week here in the clinic. If anything worsens or changes patient will contact our office for additional recommendations. Electronic Signature(s) Signed: 05/01/2020 2:14:56 PM By: [name] Keeler PA-C Entered By: [name] [name] on 05/01/2020 14:14:56 -------------------------------------------------------------------------------- SuperBill Details Patient Name: Date of Service: [name] [name], [name] RA E. 05/01/2020 Medical Record Number: [id number] Patient Account Number: [id number] Date of Birth/Sex: Treating RN: Nov 02, 1945 (74 y.o. [name] [name] Primary Care Provider: Billey [name] Other Clinician: Referring Provider: Treating Provider/Extender: [name] [name] in Treatment: 15 Diagnosis Coding ICD-10 Codes Code Description E11.621 Type 2 diabetes mellitus with foot ulcer L97.522 Non-pressure chronic ulcer of other part of left foot with  fat layer exposed M14.672 Charcot's joint, left ankle and foot E11.40 Type 2 diabetes mellitus with diabetic neuropathy, unspecified I10 Essential (primary) hypertension Facility Procedures CPT4: Code 76160737 110 Description: 58 - DEB SUBQ TISSUE 20 SQ CM/< ICD-10 Diagnosis Description L97.522 Non-pressure chronic ulcer of other part of left foot with fat layer exposed Modifier: Quantity: 1 CPT4: [id number] Description: 81 BILATERAL: Application of multi-layer venous compression system; leg (below knee), including ankle and t. Modifier: 59 Quantity: 1 Physician Procedures : CPT4 Code Description Modifier [id number] - WC PHYS SUBQ TISS 20 SQ CM ICD-10 Diagnosis Description L97.522 Non-pressure chronic ulcer of other part of left foot with fat layer exposed Quantity: 1 Electronic Signature(s) Signed: 05/01/2020 2:15:15 PM By: [name] Keeler PA-C Entered By: [name] [name] on 05/01/2020 14:15:14

## 2020-05-02 ENCOUNTER — Encounter: Payer: Self-pay | Admitting: Internal Medicine

## 2020-05-02 MED ORDER — LISINOPRIL-HYDROCHLOROTHIAZIDE 20-25 MG PO TABS: 1.0000 | ORAL_TABLET | Freq: Every day | ORAL | 1 refills | Status: DC

## 2020-05-03 ENCOUNTER — Ambulatory Visit (INDEPENDENT_AMBULATORY_CARE_PROVIDER_SITE_OTHER): Payer: Medicare Other

## 2020-05-03 DIAGNOSIS — Z0000 Encounter for general adult medical examination without abnormal findings: Secondary | ICD-10-CM | POA: Diagnosis not present

## 2020-05-03 NOTE — Progress Notes (Signed)
WELLS, GERDEMAN ([id number]) Visit Report for 05/01/2020 Arrival Information Details Patient Name: Date of Service: [name] [name], [name] E. 05/01/2020 12:45 PM Medical Record Number: [id number] Patient Account Number: [id number] Date of Birth/Sex: Treating RN: 22-Dec-1945 (74 y.o. [name] [name], [name] [name] Primary Care Loudon Krakow: [name] Other Clinician: Referring Adira Limburg: Treating Tzvi Economou/Extender: Adele Schilder in Treatment: 15 Visit Information History Since Last Visit Added or deleted any medications: No Patient Arrived: Wheel Chair Any new allergies or adverse reactions: No Arrival Time: 13:12 Had a fall or experienced change in No Accompanied By: self activities of daily living that may affect Transfer Assistance: None risk of falls: Patient Identification Verified: Yes Signs or symptoms of abuse/neglect since last visito No Secondary Verification Process Completed: Yes Hospitalized since last visit: No Patient Requires Transmission-Based Precautions: No Implantable device outside of the clinic excluding No Patient Has Alerts: No cellular tissue based products placed in the center since last visit: Has Dressing in Place as Prescribed: Yes Pain Present Now: No Electronic Signature(s) Signed: 05/02/2020 8:15:52 AM By: Sandre Kitty Entered By: Sandre Kitty on 05/01/2020 13:15:02 -------------------------------------------------------------------------------- Compression Therapy Details Patient Name: Date of Service: [name] Amato RA E. 05/01/2020 12:45 PM Medical Record Number: [id number] Patient Account Number: [id number] Date of Birth/Sex: Treating RN: 1946/07/30 (74 y.o. [name] [name] Primary Care Holli Rengel: [name] Other Clinician: Referring Katrell Milhorn: Treating Abad Manard/Extender: Adele Schilder in Treatment: 15 Compression Therapy Performed for Wound Assessment: NonWound Condition Lymphedema - Right Leg Performed By:  Clinician Carlene Coria, RN Compression Type: Three Layer Post Procedure Diagnosis Same as Pre-procedure Electronic Signature(s) Signed: 05/01/2020 5:11:27 PM By: [name] RN, BSN Entered By: [name] on 05/01/2020 14:02:04 -------------------------------------------------------------------------------- Compression Therapy Details Patient Name: Date of Service: [name] Amato RA E. 05/01/2020 12:45 PM Medical Record Number: [id number] Patient Account Number: [id number] Date of Birth/Sex: Treating RN: [birth year] (74 y.o. [name] [name] Primary Care Malayia Spizzirri: [name] Other Clinician: Referring Braydn Carneiro: Treating Kristiane Morsch/Extender: Adele Schilder in Treatment: 15 Compression Therapy Performed for Wound Assessment: NonWound Condition Lymphedema - Left Leg Performed By: Clinician Carlene Coria, RN Compression Type: Three Layer Post Procedure Diagnosis Same as Pre-procedure Electronic Signature(s) Signed: 05/01/2020 5:11:27 PM By: [name] RN, BSN Entered By: [name] on 05/01/2020 14:02:27 -------------------------------------------------------------------------------- Encounter Discharge Information Details Patient Name: Date of Service: [name] Guarneri RBA RA E. 05/01/2020 12:45 PM Medical Record Number: [id number] Patient Account Number: [id number] Date of Birth/Sex: Treating RN: [birth year] (74 y.o. [name] [name] Primary Care Leatha Rohner: [name] Other Clinician: Referring Manus Weedman: Treating Roshun Klingensmith/Extender: Adele Schilder in Treatment: 15 Encounter Discharge Information Items Post Procedure Vitals Discharge Condition: Stable Temperature (F): 98.5 Ambulatory Status: Wheelchair Pulse (bpm): 99 Discharge Destination: Home Respiratory Rate (breaths/min): 18 Transportation: Private Auto Blood Pressure (mmHg): 149/83 Accompanied By: caregiver Schedule Follow-up Appointment: Yes Clinical Summary of Care:  Patient Declined Electronic Signature(s) Signed: 05/03/2020 4:51:09 PM By: Carlene Coria RN Entered By: Carlene Coria on 05/01/2020 14:52:28 -------------------------------------------------------------------------------- Lower Extremity Assessment Details Patient Name: Date of Service: [name] [name], [name] E. 05/01/2020 12:45 PM Medical Record Number: [id number] Patient Account Number: [id number] Date of Birth/Sex: Treating RN: 14-May-1946 (74 y.o. [name] Primary Care Lorrin Nawrot: [name] Other Clinician: Referring Jamaurion Slemmer: Treating Meital Riehl/Extender: Landis Martins Weeks in Treatment: 15 Edema Assessment Assessed: [Left: Yes] [Right: No] Edema: [Left: Ye] [Right: s] Calf Left: Right: Point of Measurement: 35 cm From Medial Instep 40 cm cm  Ankle Left: Right: Point of Measurement: 9 cm From Medial Instep 27 cm cm Vascular Assessment Pulses: Dorsalis Pedis Palpable: [Left:Yes] Electronic Signature(s) Signed: 05/01/2020 5:01:59 PM By: Deon Pilling Entered By: Deon Pilling on 05/01/2020 13:29:11 -------------------------------------------------------------------------------- Multi-Disciplinary Care Plan Details Patient Name: Date of Service: [name] Amato RA E. 05/01/2020 12:45 PM Medical Record Number: [id number] Patient Account Number: [id number] Date of Birth/Sex: Treating RN: [birth year] (74 y.o. [name] [name] Primary Care Elon Eoff: [name] Other Clinician: Referring Crystina Borrayo: Treating Halla Chopp/Extender: Adele Schilder in Treatment: 15 Active Inactive Nutrition Nursing Diagnoses: Impaired glucose control: actual or potential Potential for alteratiion in Nutrition/Potential for imbalanced nutrition Goals: Patient/caregiver verbalizes understanding of need to maintain therapeutic glucose control per primary care physician Date Initiated: 01/17/2020 Date Inactivated: 02/14/2020 Target Resolution Date: 02/14/2020 Goal Status:  Met Patient/caregiver will maintain therapeutic glucose control Date Initiated: 01/17/2020 Target Resolution Date: 05/08/2020 Goal Status: Active Interventions: Assess HgA1c results as ordered upon admission and as needed Assess patient nutrition upon admission and as needed per policy Provide education on elevated blood sugars and impact on wound healing Treatment Activities: Patient referred to Primary Care Physician for further nutritional evaluation : 01/17/2020 Notes: Wound/Skin Impairment Nursing Diagnoses: Impaired tissue integrity Knowledge deficit related to ulceration/compromised skin integrity Goals: Patient/caregiver will verbalize understanding of skin care regimen Date Initiated: 01/17/2020 Target Resolution Date: 05/08/2020 Goal Status: Active Ulcer/skin breakdown will have a volume reduction of 30% by week 4 Date Initiated: 01/17/2020 Date Inactivated: 02/14/2020 Target Resolution Date: 02/14/2020 Goal Status: Met Ulcer/skin breakdown will have a volume reduction of 50% by week 8 Date Initiated: 02/14/2020 Date Inactivated: 03/13/2020 Target Resolution Date: 03/13/2020 Goal Status: Met Ulcer/skin breakdown will have a volume reduction of 80% by week 12 Date Initiated: 03/13/2020 Date Inactivated: 04/10/2020 Target Resolution Date: 04/10/2020 Goal Status: Unmet Unmet Reason: infection Interventions: Assess patient/caregiver ability to obtain necessary supplies Assess patient/caregiver ability to perform ulcer/skin care regimen upon admission and as needed Assess ulceration(s) every visit Provide education on ulcer and skin care Treatment Activities: Skin care regimen initiated : 01/17/2020 Topical wound management initiated : 01/17/2020 Notes: Electronic Signature(s) Signed: 05/01/2020 5:11:27 PM By: [name] RN, BSN Entered By: [name] on 05/01/2020 13:58:57 -------------------------------------------------------------------------------- Pain Assessment  Details Patient Name: Date of Service: [name] Amato RA E. 05/01/2020 12:45 PM Medical Record Number: [id number] Patient Account Number: [id number] Date of Birth/Sex: Treating RN: 1945-08-17 (74 y.o. [name] [name] Primary Care Jovonte Commins: [name] Other Clinician: Referring Jaymes Hang: Treating Khalila Buechner/Extender: Adele Schilder in Treatment: 15 Active Problems Location of Pain Severity and Description of Pain Patient Has Paino No Site Locations Pain Management and Medication Current Pain Management: Electronic Signature(s) Signed: 05/01/2020 5:11:27 PM By: [name] RN, BSN Signed: 05/02/2020 8:15:52 AM By: Sandre Kitty Entered By: Sandre Kitty on 05/01/2020 13:15:50 -------------------------------------------------------------------------------- Patient/Caregiver Education Details Patient Name: Date of Service: [name] Amato RA E. 9/22/2021andnbsp12:45 PM Medical Record Number: [id number] Patient Account Number: [id number] Date of Birth/Gender: Treating RN: [birth year] (74 y.o. [name] [name] Primary Care Physician: [name] Other Clinician: Referring Physician: Treating Physician/Extender: Adele Schilder in Treatment: 15 Education Assessment Education Provided To: Patient Education Topics Provided Venous: Methods: Explain/Verbal Responses: Reinforcements needed, State content correctly Wound/Skin Impairment: Methods: Explain/Verbal Responses: Reinforcements needed, State content correctly Electronic Signature(s) Signed: 05/01/2020 5:11:27 PM By: [name] RN, BSN Entered By: [name] on 05/01/2020 14:00:28 -------------------------------------------------------------------------------- Wound Assessment Details Patient Name: Date of Service: [name] [name], [name] RA  E. 05/01/2020 12:45 PM Medical Record Number: [id number] Patient Account Number: [id number] Date of Birth/Sex: Treating RN: 1946-07-28  (74 y.o. [name] [name] Primary Care Anirudh Baiz: [name] Other Clinician: Referring Jyll Tomaro: Treating Suheily Birks/Extender: Landis Martins Weeks in Treatment: 15 Wound Status Wound Number: 1 Primary Diabetic Wound/Ulcer of the Lower Extremity Etiology: Wound Location: Left, Medial, Plantar Foot Wound Open Wounding Event: Gradually Appeared Status: Date Acquired: 05/11/2019 Comorbid Sleep Apnea, Deep Vein Thrombosis, Hypertension, Colitis, Type Weeks Of Treatment: 15 History: II Diabetes, Gout, Osteoarthritis, Neuropathy Clustered Wound: No Photos Photo Uploaded By: Mikeal Hawthorne on 05/02/2020 12:00:27 Wound Measurements Length: (cm) 0.4 Width: (cm) 0.5 Depth: (cm) 0.4 Area: (cm) 0.157 Volume: (cm) 0.063 % Reduction in Area: 84.6% % Reduction in Volume: 84.6% Epithelialization: Medium (34-66%) Tunneling: No Undermining: No Wound Description Classification: Grade 2 Wound Margin: Well defined, not attached Exudate Amount: Small Exudate Type: Serous Exudate Color: amber Foul Odor After Cleansing: No Slough/Fibrino No Wound Bed Granulation Amount: Large (67-100%) Exposed Structure Granulation Quality: Pink Fascia Exposed: No Necrotic Amount: None Present (0%) Fat Layer (Subcutaneous Tissue) Exposed: Yes Tendon Exposed: No Muscle Exposed: No Joint Exposed: No Bone Exposed: No Electronic Signature(s) Signed: 05/01/2020 5:01:59 PM By: Deon Pilling Signed: 05/01/2020 5:11:27 PM By: [name] RN, BSN Entered By: Deon Pilling on 05/01/2020 13:29:29 -------------------------------------------------------------------------------- Wound Assessment Details Patient Name: Date of Service: [name] Amato RA E. 05/01/2020 12:45 PM Medical Record Number: [id number] Patient Account Number: [id number] Date of Birth/Sex: Treating RN: 1946/06/22 (74 y.o. [name] [name] Primary Care Grizelda Piscopo: [name] Other Clinician: Referring Dnasia Gauna: Treating  Ahja Martello/Extender: Landis Martins Weeks in Treatment: 15 Wound Status Wound Number: 2 Primary Abrasion Etiology: Wound Location: Left, Distal, Medial T Second oe Wound Open Wounding Event: Shear/Friction Status: Date Acquired: 05/01/2020 Comorbid Sleep Apnea, Deep Vein Thrombosis, Hypertension, Colitis, Type Weeks Of Treatment: 0 History: II Diabetes, Gout, Osteoarthritis, Neuropathy Clustered Wound: No Photos Photo Uploaded By: Mikeal Hawthorne on 05/02/2020 12:00:27 Wound Measurements Length: (cm) 0.4 Width: (cm) 0.5 Depth: (cm) 0.1 Area: (cm) 0.157 Volume: (cm) 0.016 % Reduction in Area: 0% % Reduction in Volume: 0% Epithelialization: Small (1-33%) Tunneling: No Undermining: No Wound Description Classification: Full Thickness Without Exposed Support Structures Wound Margin: Distinct, outline attached Exudate Amount: Medium Exudate Type: Serosanguineous Exudate Color: red, brown Foul Odor After Cleansing: No Slough/Fibrino No Wound Bed Granulation Amount: Large (67-100%) Exposed Structure Granulation Quality: Pink, Pale Fascia Exposed: No Necrotic Amount: None Present (0%) Fat Layer (Subcutaneous Tissue) Exposed: Yes Tendon Exposed: No Muscle Exposed: No Joint Exposed: No Bone Exposed: No Electronic Signature(s) Signed: 05/01/2020 5:01:59 PM By: Deon Pilling Signed: 05/01/2020 5:11:27 PM By: [name] RN, BSN Entered By: Deon Pilling on 05/01/2020 13:30:06 -------------------------------------------------------------------------------- Vitals Details Patient Name: Date of Service: [name] Guarneri RBA RA E. 05/01/2020 12:45 PM Medical Record Number: [id number] Patient Account Number: [id number] Date of Birth/Sex: Treating RN: Aug 26, 1945 (74 y.o. [name] [name] Primary Care Ray Glacken: [name] Other Clinician: Referring Caylie Sandquist: Treating Makinzy Cleere/Extender: Adele Schilder in Treatment: 15 Vital Signs Time Taken:  13:15 Temperature (F): 98.5 Height (in): 66 Pulse (bpm): 99 Weight (lbs): 245 Respiratory Rate (breaths/min): 18 Body Mass Index (BMI): 39.5 Blood Pressure (mmHg): 149/83 Reference Range: 80 - 120 mg / dl Electronic Signature(s) Signed: 05/02/2020 8:15:52 AM By: Sandre Kitty Entered By: Sandre Kitty on 05/01/2020 13:15:44

## 2020-05-03 NOTE — Patient Instructions (Addendum)
[name] [name] , Thank you for taking time to come for your Medicare Wellness Visit. I appreciate your ongoing commitment to your health goals. Please review the following plan we discussed and let me know if I can assist you in the future.   Screening recommendations/referrals: Colonoscopy: 06/25/2014; due every 10 years Mammogram: 08/10/2019; due every year Bone Density: 06/09/2017; due every 2 years Recommended yearly ophthalmology/optometry visit for glaucoma screening and checkup Recommended yearly dental visit for hygiene and checkup  Vaccinations: Influenza vaccine: 05/11/2019 Pneumococcal vaccine: up to date Tdap vaccine: 09/13/2018; due every 10 years Shingles vaccine: up to date   Covid-19: up to date (booster included)  Advanced directives: Please bring a copy of your health care power of attorney and living will to the office at your convenience.  Conditions/risks identified: Yes; Reviewed health maintenance screenings with patient today and relevant education, vaccines, and/or referrals were provided. Please continue to do your personal lifestyle choices by: daily care of teeth and gums, regular physical activity (goal should be 5 days a week for 30 minutes), eat a healthy diet, avoid tobacco and drug use, limiting any alcohol intake, taking a low-dose aspirin (if not allergic or have been advised by your provider otherwise) and taking vitamins and minerals as recommended by your provider. Continue doing brain stimulating activities (puzzles, reading, adult coloring books, staying active) to keep memory sharp. Continue to eat heart healthy diet (full of fruits, vegetables, whole grains, lean protein, water--limit salt, fat, and sugar intake) and increase physical activity as tolerated.  Next appointment: Please schedule your next Medicare Wellness Visit with your Nurse Health Advisor in 1 year by calling [phone]  Preventive Care 65 Years and Older, Female Preventive care refers to  lifestyle choices and visits with your health care provider that can promote health and wellness. What does preventive care include?  A yearly physical exam. This is also called an annual well check.  Dental exams once or twice a year.  Routine eye exams. Ask your health care provider how often you should have your eyes checked.  Personal lifestyle choices, including:  Daily care of your teeth and gums.  Regular physical activity.  Eating a healthy diet.  Avoiding tobacco and drug use.  Limiting alcohol use.  Practicing safe sex.  Taking low-dose aspirin every day.  Taking vitamin and mineral supplements as recommended by your health care provider. What happens during an annual well check? The services and screenings done by your health care provider during your annual well check will depend on your age, overall health, lifestyle risk factors, and family history of disease. Counseling  Your health care provider may ask you questions about your:  Alcohol use.  Tobacco use.  Drug use.  Emotional well-being.  Home and relationship well-being.  Sexual activity.  Eating habits.  History of falls.  Memory and ability to understand (cognition).  Work and work Statistician.  Reproductive health. Screening  You may have the following tests or measurements:  Height, weight, and BMI.  Blood pressure.  Lipid and cholesterol levels. These may be checked every 5 years, or more frequently if you are over 9 years old.  Skin check.  Lung cancer screening. You may have this screening every year starting at age 26 if you have a 30-pack-year history of smoking and currently smoke or have quit within the past 15 years.  Fecal occult blood test (FOBT) of the stool. You may have this test every year starting at age 10.  Flexible sigmoidoscopy  or colonoscopy. You may have a sigmoidoscopy every 5 years or a colonoscopy every 10 years starting at age 32.  Hepatitis C blood  test.  Hepatitis B blood test.  Sexually transmitted disease (STD) testing.  Diabetes screening. This is done by checking your blood sugar (glucose) after you have not eaten for a while (fasting). You may have this done every 1-3 years.  Bone density scan. This is done to screen for osteoporosis. You may have this done starting at age 94.  Mammogram. This may be done every 1-2 years. Talk to your health care provider about how often you should have regular mammograms. Talk with your health care provider about your test results, treatment options, and if necessary, the need for more tests. Vaccines  Your health care provider may recommend certain vaccines, such as:  Influenza vaccine. This is recommended every year.  Tetanus, diphtheria, and acellular pertussis (Tdap, Td) vaccine. You may need a Td booster every 10 years.  Zoster vaccine. You may need this after age 19.  Pneumococcal 13-valent conjugate (PCV13) vaccine. One dose is recommended after age 40.  Pneumococcal polysaccharide (PPSV23) vaccine. One dose is recommended after age 52. Talk to your health care provider about which screenings and vaccines you need and how often you need them. This information is not intended to replace advice given to you by your health care provider. Make sure you discuss any questions you have with your health care provider. Document Released: 08/23/2015 Document Revised: 04/15/2016 Document Reviewed: 05/28/2015 Elsevier Interactive Patient Education  2017 [place] Prevention in the Home Falls can cause injuries. They can happen to people of all ages. There are many things you can do to make your home safe and to help prevent falls. What can I do on the outside of my home?  Regularly fix the edges of walkways and driveways and fix any cracks.  Remove anything that might make you trip as you walk through a door, such as a raised step or threshold.  Trim any bushes or trees on the  path to your home.  Use bright outdoor lighting.  Clear any walking paths of anything that might make someone trip, such as rocks or tools.  Regularly check to see if handrails are loose or broken. Make sure that both sides of any steps have handrails.  Any raised decks and porches should have guardrails on the edges.  Have any leaves, snow, or ice cleared regularly.  Use sand or salt on walking paths during winter.  Clean up any spills in your garage right away. This includes oil or grease spills. What can I do in the bathroom?  Use night lights.  Install grab bars by the toilet and in the tub and shower. Do not use towel bars as grab bars.  Use non-skid mats or decals in the tub or shower.  If you need to sit down in the shower, use a plastic, non-slip stool.  Keep the floor dry. Clean up any water that spills on the floor as soon as it happens.  Remove soap buildup in the tub or shower regularly.  Attach bath mats securely with double-sided non-slip rug tape.  Do not have throw rugs and other things on the floor that can make you trip. What can I do in the bedroom?  Use night lights.  Make sure that you have a light by your bed that is easy to reach.  Do not use any sheets or blankets that are  too big for your bed. They should not hang down onto the floor.  Have a firm chair that has side arms. You can use this for support while you get dressed.  Do not have throw rugs and other things on the floor that can make you trip. What can I do in the kitchen?  Clean up any spills right away.  Avoid walking on wet floors.  Keep items that you use a lot in easy-to-reach places.  If you need to reach something above you, use a strong step stool that has a grab bar.  Keep electrical cords out of the way.  Do not use floor polish or wax that makes floors slippery. If you must use wax, use non-skid floor wax.  Do not have throw rugs and other things on the floor that can  make you trip. What can I do with my stairs?  Do not leave any items on the stairs.  Make sure that there are handrails on both sides of the stairs and use them. Fix handrails that are broken or loose. Make sure that handrails are as long as the stairways.  Check any carpeting to make sure that it is firmly attached to the stairs. Fix any carpet that is loose or worn.  Avoid having throw rugs at the top or bottom of the stairs. If you do have throw rugs, attach them to the floor with carpet tape.  Make sure that you have a light switch at the top of the stairs and the bottom of the stairs. If you do not have them, ask someone to add them for you. What else can I do to help prevent falls?  Wear shoes that:  Do not have high heels.  Have rubber bottoms.  Are comfortable and fit you well.  Are closed at the toe. Do not wear sandals.  If you use a stepladder:  Make sure that it is fully opened. Do not climb a closed stepladder.  Make sure that both sides of the stepladder are locked into place.  Ask someone to hold it for you, if possible.  Clearly mark and make sure that you can see:  Any grab bars or handrails.  First and last steps.  Where the edge of each step is.  Use tools that help you move around (mobility aids) if they are needed. These include:  Canes.  Walkers.  Scooters.  Crutches.  Turn on the lights when you go into a dark area. Replace any light bulbs as soon as they burn out.  Set up your furniture so you have a clear path. Avoid moving your furniture around.  If any of your floors are uneven, fix them.  If there are any pets around you, be aware of where they are.  Review your medicines with your doctor. Some medicines can make you feel dizzy. This can increase your chance of falling. Ask your doctor what other things that you can do to help prevent falls. This information is not intended to replace advice given to you by your health care  provider. Make sure you discuss any questions you have with your health care provider. Document Released: 05/23/2009 Document Revised: 01/02/2016 Document Reviewed: 08/31/2014 Elsevier Interactive Patient Education  2017 [employer].

## 2020-05-03 NOTE — Progress Notes (Signed)
I connected with Shinika Estelle today by telephone and verified that I am speaking with the correct person using two identifiers. Location patient: home Location provider: work Persons participating in the virtual visit: Shaniya Tashiro and Lisette Abu, LPN.   I discussed the limitations, risks, security and privacy concerns of performing an evaluation and management service by telephone and the availability of in person appointments. I also discussed with the patient that there may be a patient responsible charge related to this service. The patient expressed understanding and verbally consented to this telephonic visit.    Interactive audio and video telecommunications were attempted between this provider and patient, however failed, due to patient having technical difficulties OR patient did not have access to video capability.  We continued and completed visit with audio only.  Some vital signs may be absent or patient reported.   Time Spent with patient on telephone encounter: 20 minutes  Subjective:   [name] [name] is a 74 y.o. female who presents for Medicare Annual (Subsequent) preventive examination.  Review of Systems    No ROS. Medicare Wellness Visit. Cardiac Risk Factors include: advanced age (>65men, >7 women);diabetes mellitus;dyslipidemia;family history of premature cardiovascular disease;hypertension;obesity (BMI >30kg/m2)     Objective:    Today's Vitals   05/03/20 0903  PainSc: 4    There is no height or weight on file to calculate BMI.  Advanced Directives 05/03/2020 03/12/2020 07/08/2019 07/07/2019 05/02/2018 05/01/2018 01/19/2018  Does Patient Have a Medical Advance Directive? Yes No - No Yes Yes No  Type of Paramedic of [place];Living will - - - Living will - -  Does patient want to make changes to medical advance directive? No - Patient declined - - - No - Patient declined - -  Copy of [place] in Chart? No - copy  requested - - - - - -  Would patient like information on creating a medical advance directive? - - No - Patient declined - - - -  Pre-existing out of facility DNR order (yellow form or pink MOST form) - - - - - - -    Current Medications (verified) Outpatient Encounter Medications as of 05/03/2020  Medication Sig  . acetaminophen (TYLENOL) 325 MG tablet Take 2 tablets (650 mg total) by mouth every 6 (six) hours as needed for mild pain or headache.  . allopurinol (ZYLOPRIM) 100 MG tablet Take 1 tablet (100 mg total) by mouth 2 (two) times daily.  Marland Kitchen ALPRAZolam (XANAX) 0.25 MG tablet Take 1 tablet (0.25 mg total) by mouth 2 (two) times daily as needed for anxiety.  Marland Kitchen aspirin 81 MG tablet Take 81 mg by mouth every other day.   Marland Kitchen azelastine (ASTELIN) 0.1 % nasal spray Place 2 sprays into both nostrils 2 (two) times daily. Use in each nostril as directed  . Budesonide (UCERIS) 9 MG TB24 Take 9 mg by mouth daily.   . Certolizumab Pegol (CIMZIA Alex) Inject 400 mg into the skin every 30 (thirty) days. 200 mg on each side of stomach  . Cholecalciferol (VITAMIN D3) 1000 UNITS CAPS Take 1,000 Units by mouth 2 (two) times daily.   . citalopram (CELEXA) 40 MG tablet TAKE 1 TABLET BY MOUTH EVERY DAY  . citalopram (CELEXA) 40 MG tablet Take 1 tablet (40 mg total) by mouth daily.  . Cyanocobalamin (VITAMIN B-12) 1000 MCG SUBL Place 1,000 mcg under the tongue daily.  . cyclobenzaprine (FLEXERIL) 10 MG tablet Take 1 tablet (10 mg total)  by mouth at bedtime.  . docusate sodium (COLACE) 100 MG capsule Take 1 capsule (100 mg total) by mouth 2 (two) times daily.  . folic acid (FOLVITE) 1 MG tablet TAKE 1 TABLET(1 MG) BY MOUTH DAILY  . Insulin Pen Needle (B-D UF III MINI PEN NEEDLES) 31G X 5 MM MISC Use daily for insulin pen  . LEVEMIR FLEXTOUCH 100 UNIT/ML FlexPen ADMINISTER 20 UNITS UNDER THE SKIN TWICE DAILY  . lisinopril-hydrochlorothiazide (ZESTORETIC) 20-25 MG tablet Take 1 tablet by mouth daily.  Marland Kitchen loperamide  (IMODIUM) 2 MG capsule Take 6 mg by mouth 2 (two) times daily.  . Melatonin 5 MG SUBL Place 5 mg under the tongue at bedtime.  . metFORMIN (GLUCOPHAGE-XR) 500 MG 24 hr tablet TAKE 1 TABLET(500 MG) BY MOUTH TWICE DAILY  . omeprazole (PRILOSEC) 40 MG capsule Take 40 mg by mouth at bedtime.  . potassium chloride (KLOR-CON) 10 MEQ tablet TAKE 2 TABLETS(20 MEQ) BY MOUTH DAILY  . potassium chloride (KLOR-CON) 10 MEQ tablet Take 2 tablets (20 mEq total) by mouth daily.  . Probiotic Product (ALIGN PO) Take 1 capsule by mouth daily.  . rosuvastatin (CRESTOR) 5 MG tablet Take 1 tablet (5 mg total) by mouth daily.  Marland Kitchen triamcinolone cream (KENALOG) 0.1 % Apply 1 application topically 2 (two) times daily.   No facility-administered encounter medications on file as of 05/03/2020.    Allergies (verified) Other, Bee venom, Farxiga [dapagliflozin], and Sulfa antibiotics   History: Past Medical History:  Diagnosis Date  . Arthritis   . Closed fracture of fifth metatarsal bone 05/13/2018  . Deep vein thrombosis ([place])    right calf - 05/2012   . Diabetes mellitus without complication (HCC)    diet controlled   . GERD (gastroesophageal reflux disease)   . Hyperlipidemia   . Hypertension    Ejection fraction =>55% Left ventricular systolic function is normal. Left ventricular wall motion is normal    . Lymphocytic colitis   . Neuropathy    diabetic - in bilateral feet   . Pityriasis lichenoides chronica   . Sleep apnea    bipap   Past Surgical History:  Procedure Laterality Date  . DILATION AND CURETTAGE OF UTERUS    . HAMMER TOE SURGERY    . right hand surgery      due to blood infection   . torn meniscus repair      right knee   . TOTAL HIP ARTHROPLASTY  09/13/2012   Procedure: TOTAL HIP ARTHROPLASTY ANTERIOR APPROACH;  Surgeon: Mauri Pole, MD;  Location: [facility];  Service: Orthopedics;  Laterality: Right;   Family History  Problem Relation Age of Onset  . Hypertension Mother    Social  History   Socioeconomic History  . Marital status: Divorced    Spouse name: Not on file  . Number of children: Not on file  . Years of education: Not on file  . Highest education level: Not on file  Occupational History  . Not on file  Tobacco Use  . Smoking status: Never Smoker  . Smokeless tobacco: Never Used  Substance and Sexual Activity  . Alcohol use: Yes    Comment: occasional wine   . Drug use: No  . Sexual activity: Not on file  Other Topics Concern  . Not on file  Social History Narrative   She is widowed. She has 2 children, 2 grandchildren. She does drink occasional alcohol . There is no tobacco . She does not have a  routine exercise program   Social Determinants of Health   Financial Resource Strain: Low Risk   . Difficulty of Paying Living Expenses: Not hard at all  Food Insecurity: No Food Insecurity  . Worried About Charity fundraiser in the Last Year: Never true  . Ran Out of Food in the Last Year: Never true  Transportation Needs: No Transportation Needs  . Lack of Transportation (Medical): No  . Lack of Transportation (Non-Medical): No  Physical Activity: Inactive  . Days of Exercise per Week: 0 days  . Minutes of Exercise per Session: 0 min  Stress: No Stress Concern Present  . Feeling of Stress : Not at all  Social Connections: Unknown  . Frequency of Communication with Friends and Family: More than three times a week  . Frequency of Social Gatherings with Friends and Family: Once a week  . Attends Religious Services: Patient refused  . Active Member of Clubs or Organizations: Patient refused  . Attends Archivist Meetings: Patient refused  . Marital Status: Widowed    Tobacco Counseling Counseling given: Not Answered   Clinical Intake:  Pre-visit preparation completed: Yes  Pain : 0-10 Pain Score: 4  Pain Type: Chronic pain Pain Location: Foot Pain Orientation: Right, Left Pain Radiating Towards: toes Pain Descriptors /  Indicators: Constant, Discomfort Pain Onset: More than a month ago Pain Frequency: Constant Pain Relieving Factors: Tylenol; goes to wound care center for treatment once a week. Effect of Pain on Daily Activities: It decreases her quality of life because she can not be active during this time.  Pain Relieving Factors: Tylenol; goes to wound care center for treatment once a week.  Nutritional Risks: Non-healing wound Diabetes: No  How often do you need to have someone help you when you read instructions, pamphlets, or other written materials from your doctor or pharmacy?: 1 - Never What is the last grade level you completed in school?: Graduate Degree  Diabetic? yes  Interpreter Needed?: No  Information entered by :: Caliah Kopke N. Marcia Lepera, LPN   Activities of Daily Living In your present state of health, do you have any difficulty performing the following activities: 05/03/2020 07/07/2019  Hearing? N N  Vision? N N  Difficulty concentrating or making decisions? Y N  Comment with names and words -  Walking or climbing stairs? Y Y  Comment wounds on her feet; in wound care -  Dressing or bathing? N Y  Doing errands, shopping? Tempie Donning  Preparing Food and eating ? N -  Using the Toilet? N -  In the past six months, have you accidently leaked urine? Y -  Do you have problems with loss of bowel control? N -  Managing your Medications? N -  Managing your Finances? N -  Housekeeping or managing your Housekeeping? N -  Some recent data might be hidden    Patient Care Team: Binnie Rail, MD as PCP - General (Internal Medicine) Troy Sine, MD as PCP - Cardiology (Cardiology)  Indicate any recent Medical Services you may have received from other than [facility] providers in the past year (date may be approximate).     Assessment:   This is a routine wellness examination for [place].  Hearing/Vision screen No exam data present  Dietary issues and exercise activities  discussed: Current Exercise Habits: The patient does not participate in regular exercise at present, Exercise limited by: Other - see comments  Goals    . Patient Stated  I want to regain my quality of life by improving my feet and start back being active again.      Depression Screen PHQ 2/9 Scores 05/03/2020 04/05/2020 11/14/2015  PHQ - 2 Score 2 2 0  PHQ- 9 Score 6 6 -    Fall Risk Fall Risk  05/03/2020 08/15/2019 11/14/2015  Falls in the past year? 0 0 No  Number falls in past yr: 0 0 -  Injury with Fall? 0 - -  Risk for fall due to : Other (Comment) - -  Risk for fall due to: Comment wound on her feet; she uses a w/c or cane for assitance. - -  Follow up Falls evaluation completed - -    Any stairs in or around the home? Yes  (patient has a stair lift) If so, are there any without handrails? No  Home free of loose throw rugs in walkways, pet beds, electrical cords, etc? Yes  Adequate lighting in your home to reduce risk of falls? Yes   ASSISTIVE DEVICES UTILIZED TO PREVENT FALLS:  Life alert? No  Use of a cane, walker or w/c? Yes  Grab bars in the bathroom? Yes  Shower chair or bench in shower? Yes  Elevated toilet seat or a handicapped toilet? Yes   TIMED UP AND GO:  Was the test performed? No .  Length of time to ambulate 10 feet: 0 sec.   Gait slow and steady with assistive device  Cognitive Function: not indicated; Patient is cogitatively intact.        Immunizations Immunization History  Administered Date(s) Administered  . Influenza,inj,Quad PF,6+ Mos 05/18/2014, 05/11/2019  . Influenza,inj,quad, With Preservative 04/12/2017  . Influenza-Unspecified 05/10/2014  . Moderna SARS-COVID-2 Vaccination 09/09/2019, 10/07/2019, 04/09/2020  . Pneumococcal Conjugate-13 10/02/2014  . Pneumococcal Polysaccharide-23 11/20/2010, 07/28/2018  . Td 08/10/2008, 09/13/2018  . Unspecified SARS-COV-2 Vaccination 10/10/2019  . Zoster Recombinat (Shingrix) 02/17/2018,  05/26/2018    TDAP status: Up to date Flu Vaccine status: Up to date Pneumococcal vaccine status: Up to date Covid-19 vaccine status: Completed vaccines  (also received booster vaccine)  Qualifies for Shingles Vaccine? Yes   Zostavax completed Yes   Shingrix Completed?: Yes  Screening Tests Health Maintenance  Topic Date Due  . Hepatitis C Screening  Never done  . FOOT EXAM  Never done  . COLONOSCOPY  Never done  . INFLUENZA VACCINE  03/10/2020  . OPHTHALMOLOGY EXAM  05/25/2020  . HEMOGLOBIN A1C  10/06/2020  . MAMMOGRAM  08/09/2021  . TETANUS/TDAP  09/13/2028  . DEXA SCAN  Completed  . COVID-19 Vaccine  Completed  . PNA vac Low Risk Adult  Completed    Health Maintenance  Health Maintenance Due  Topic Date Due  . Hepatitis C Screening  Never done  . FOOT EXAM  Never done  . COLONOSCOPY  Never done  . INFLUENZA VACCINE  03/10/2020    Colorectal cancer screening: Completed 06/25/2014. Repeat every 10 years Mammogram status: Completed 08/10/2019. Repeat every year Bone Density status: Completed 06/09/2017. Results reflect: Bone density results: NORMAL. Repeat every 2 years.  Lung Cancer Screening: (Low Dose CT Chest recommended if Age 84-80 years, 30 pack-year currently smoking OR have quit w/in 15years.) does not qualify.   Lung Cancer Screening Referral: no  Additional Screening:  Hepatitis C Screening: does qualify; Completed no  Vision Screening: Recommended annual ophthalmology exams for early detection of glaucoma and other disorders of the eye. Is the patient up to date with their annual eye exam?  Yes  Who is the provider or what is the name of the office in which the patient attends annual eye exams? Shon Hough, MD If pt is not established with a provider, would they like to be referred to a provider to establish care? No .   Dental Screening: Recommended annual dental exams for proper oral hygiene  Community Resource Referral / Chronic Care  Management: CRR required this visit?  No   CCM required this visit?  No      Plan:     I have personally reviewed and noted the following in the patient's chart:   . Medical and social history . Use of alcohol, tobacco or illicit drugs  . Current medications and supplements . Functional ability and status . Nutritional status . Physical activity . Advanced directives . List of other physicians . Hospitalizations, surgeries, and ER visits in previous 12 months . Vitals . Screenings to include cognitive, depression, and falls . Referrals and appointments  In addition, I have reviewed and discussed with patient certain preventive protocols, quality metrics, and best practice recommendations. A written personalized care plan for preventive services as well as general preventive health recommendations were provided to patient.     Sheral Flow, LPN   2/75/1700   Nurse Notes:  Patient is cogitatively intact. There were no vitals filed for this visit. There is no height or weight on file to calculate BMI. Patient stated that she has issues with gait or balance due to wounds on her feet; does cane, wheelchair and stairlift.

## 2020-05-08 ENCOUNTER — Encounter (HOSPITAL_BASED_OUTPATIENT_CLINIC_OR_DEPARTMENT_OTHER): Payer: Medicare Other | Admitting: Physician Assistant

## 2020-05-08 ENCOUNTER — Other Ambulatory Visit: Payer: Self-pay

## 2020-05-08 DIAGNOSIS — I10 Essential (primary) hypertension: Secondary | ICD-10-CM | POA: Diagnosis not present

## 2020-05-08 DIAGNOSIS — G4730 Sleep apnea, unspecified: Secondary | ICD-10-CM | POA: Diagnosis not present

## 2020-05-08 DIAGNOSIS — E1140 Type 2 diabetes mellitus with diabetic neuropathy, unspecified: Secondary | ICD-10-CM | POA: Diagnosis not present

## 2020-05-08 DIAGNOSIS — I890 Lymphedema, not elsewhere classified: Secondary | ICD-10-CM | POA: Diagnosis not present

## 2020-05-08 DIAGNOSIS — E11621 Type 2 diabetes mellitus with foot ulcer: Secondary | ICD-10-CM | POA: Diagnosis not present

## 2020-05-08 DIAGNOSIS — L97522 Non-pressure chronic ulcer of other part of left foot with fat layer exposed: Secondary | ICD-10-CM | POA: Diagnosis not present

## 2020-05-08 NOTE — Progress Notes (Addendum)
[name] [name], [name] [name] ([id number]) Visit Report for 05/08/2020 Chief Complaint Document Details Patient Name: Date of Service: [name], [name] 05/08/2020 2:15 PM Medical Record Number: [id number] Patient Account Number: [id number] Date of Birth/Sex: Treating RN: 01-08-1946 (74 y.o. [name] Other Clinician: Referring Provider: Treating Provider/Extender: Adele Schilder in Treatment: 16 Information Obtained from: Patient Chief Complaint Left foot ulcer Electronic Signature(s) Signed: 05/08/2020 2:25:45 PM By: Worthy Keeler PA-C Entered By: Worthy Keeler on 05/08/2020 14:25:45 -------------------------------------------------------------------------------- Debridement Details Patient Name: Date of Service: [name] Amato RA E. 05/08/2020 2:15 PM Medical Record Number: [id number] Patient Account Number: [id number] Date of Birth/Sex: Treating RN: 1946-08-05 (74 y.o. [name] Other Clinician: Referring Provider: Treating Provider/Extender: Adele Schilder in Treatment: 16 Debridement Performed for Assessment: Wound #1 Left,Medial,Plantar Foot Performed By: Physician Worthy Keeler, PA Debridement Type: Debridement Severity of Tissue Pre Debridement: Fat layer exposed Level of Consciousness (Pre-procedure): Awake and Alert Pre-procedure Verification/Time Out Yes - 15:35 Taken: Start Time: 15:38 T Area Debrided (L x W): otal 1 (cm) x 1 (cm) = 1 (cm) Tissue and other material debrided: Viable, Non-Viable, Callus, Slough, Subcutaneous, Skin: Epidermis, Slough Level: Skin/Subcutaneous Tissue Debridement Description: Excisional Instrument: Curette Bleeding: Minimum Hemostasis Achieved: Pressure End Time: 15:41 Procedural Pain: 0 Post Procedural Pain: 0 Response to Treatment: Procedure was tolerated well Level of Consciousness (Post- Awake and  Alert procedure): Post Debridement Measurements of Total Wound Length: (cm) 0.6 Width: (cm) 0.5 Depth: (cm) 0.7 Volume: (cm) 0.165 Character of Wound/Ulcer Post Debridement: Improved Severity of Tissue Post Debridement: Fat layer exposed Post Procedure Diagnosis Same as Pre-procedure Electronic Signature(s) Signed: 05/08/2020 5:36:27 PM By: [name] RN, BSN Signed: 05/08/2020 5:47:16 PM By: Worthy Keeler PA-C Entered By: [name] on 05/08/2020 15:42:04 -------------------------------------------------------------------------------- HPI Details Patient Name: Date of Service: [name], [name] 05/08/2020 2:15 PM Medical Record Number: [id number] Patient Account Number: [id number] Date of Birth/Sex: Treating RN: [birth year] (74 y.o. [name] Other Clinician: Referring Provider: Treating Provider/Extender: Adele Schilder in Treatment: 16 History of Present Illness HPI Description: 01/17/2020 upon evaluation today patient presents for initial evaluation here in our clinic concerning issues she has been having with a left medial/plantar foot ulcer. This is actually been an issue for her since October 2020. She has been seeing Dr. Doran Durand for quite some time during that course. Fortunately there is no signs of active infection at this time. Or least no mention of this to have seen in general. With that being said unfortunately I do see some signs of erythema noted today that does have me concerned about the possibility of infection at this point in the surrounding area of the wound. There is also a warm to touch at the site which is somewhat concerning. Fortunately there is no evidence of systemic infection which is great news. The patient does have a history of diabetes mellitus type 2, Charcot foot which is what led to the wound, and hypertension. She notes that she was in a cast for some time with Dr. Doran Durand for  about 8 weeks. During that time they were utilizing according to the patient silver nitrate along with a foam doughnut and then Coban to secure in place in the cast in place. With that being said I do not have the actual records to review we are going  to try to get a hold of those unfortunately they would not flow over into care everywhere I did look today. She has been seeing Dr. Doran Durand and his physician assistant Larkin Ina up until the end of May and apparently is still seeing them on a regular basis every 2 weeks roughly. She has also tried Iodosorb without effect here. 01/24/2020 upon evaluation today patient actually appears to be doing quite well with regard to her wounds. She has been tolerating the dressing changes without complication. Fortunately there is no signs of active infection spreading which is good news. Her culture did show signs of Staph aureus I did place her on Augmentin due to the erythema surrounding the wound. With that being said the wound does appear to be doing better she has her longer walking cast/boot and I think that is actually good for her for the time being. I am considering reinitiating total contact cast when she gets back from vacation but next week she will actually be out of town at [employer] she knows not to get in the water but she still obviously is planning to enjoy herself she is going to take it easy on her foot however. 02/07/2020 upon evaluation today patient appears to be doing fairly well in regard to her ulcer on her foot. Fortunately there is no signs of severe infection at this time which is great news and overall very pleased in that regard. With that being said I do think that she could still benefit from a total contact cast. Nonetheless she is using her walking boot which at least provide some protection and that it prevents some of the friction occurring when she is ambulating. 02/14/2020 upon evaluation today patient appears to be doing well with  regard to her foot ulcer. This is actually measuring a little bit smaller yet again this week. Overall very pleased with where things stand and I do not see any signs of active infection at this time which is also good news. Since she is measuring better the patient has wanting to somewhat hold off on proceeding with the total contact cast which I think is reasonable at this point. 02/28/2020 on evaluation today patient appears to be doing well in general in regard to her wound although she has a lot of callus buildup as compared to last time I saw her. This is can require sharp debridement today. I do believe she really needs the total contact cast as well which we have discussed previous. 7/23; patient comes in for a total contact cast change 03/06/2020 on evaluation today patient appears to be doing quite well with regard to her wounds. Fortunately the wound bed is measuring smaller and looking much better there is little callus noted although there is some debridement necessary today. 03/13/2020 on evaluation today patient's wound actually appears to be doing excellent which is great news. With that being said unfortunately she is having some issues currently with her left leg where she does have cellulitis it appears. This may have come from an area that rubbed underneath the cast from last week that we noted we padded that area and it looks to be doing excellent at this point but nonetheless the leg was somewhat painful, swollen, and somewhat erythematous. She also had an elevated white blood cell count of 11.5 based on what I saw on looking at her records from the med center in [facility] from where she was seen yesterday. Unfortunately with [place] having a provider on vacation  there was no one here in the clinic in the afternoon when she called therefore she went to the ER as advised. Subsequently they did not cut off the cast as they did not have anyone from orthopedics there to do so and subsequently  also did not have the ability to do the Doppler for evaluation of DVT They recommended therefore given her dose of Eliquis as well as . Augmentin and sent her home to come see [place] today to have the cast taken off and then she is supposed to go back to have the study for DVT performed they are following. 03/20/2020 upon evaluation today patient appears to be doing well with regard to her foot all things considered we have not been able to use the total contact cast due to the infection that she had last week. She has been on the doxycycline and she had a 10-day supply of that I do believe that is helping and her leg appears to be doing better. With that being said there is fortunately no signs of active infection systemically at this time which is good news. No fevers, chills, nausea, vomiting, or diarrhea. 03/27/2020 upon evaluation today patient appears to be doing well with regard to her foot ulcer. There does not appear to be signs of active infection which is great news. Overall I am very pleased with where things stand at this point. 04/03/2020 upon evaluation today patient appears to be doing pretty well in regard to the overall appearance of her wound. Fortunately there is no signs of active infection at this time which is great news. No fevers, chills, nausea, vomiting, or diarrhea. With that being said she does have some blue-green drainage that actually is a little bit concerning to me for the possibility of Pseudomonas. I discussed that with the patient today. With that being said I do believe that we may be able to manage this however with the topical antibiotic cream as opposed to having to do anything oral especially since she seems to be doing so well with overall appearance of the wound. 04/10/2020 on evaluation today patient appears to be doing about the same roughly in regard to the overall size of her wound. With that being said she fortunately has not shown any signs of worsening  overall which is good news. I do believe that she is doing a great job trying to offload but again she may still do better with the cast. I do not see in the blue-green drainage that we noticed previously I do believe the gentamicin help in this regard. 04/17/2020 on evaluation today patient's wound appears to be doing about the same at this point. There is no significant improvement at this point. No fever chills noted. She is up for put the cast back on the day. That she states in a couple weeks she will need to have this off to go to a workshop. 04/24/2020 on evaluation today patient appears to be doing significantly better in regard to her wound. Fortunately there is no signs of active infection and overall feel like she is making great progress the cast seems to have done excellent for her. 05/01/2020 upon evaluation today patient presents for reevaluation she really does not appear to be doing too badly in regard to the actual wound on the left foot we have been managing. Unfortunately she has bilateral lower extremity edema with blisters between the webspace of her first and second toe on both feet. She has a tremendous amount  of edema in the legs which I think is where this is coming from it does not appear to be infected but nonetheless I do believe this is can be something that needs to be addressed today. Obviously this means we probably will not be putting the cast on at this point. She attributes this to the fact that she was sitting with her feet on the floor much longer during a conference last week she had a great time but unfortunately had a lot of complications as a result. 05/08/2020 upon evaluation today patient appears to be doing somewhat better in regard to her wounds at this time. Fortunately there is no signs of active infection which is great news. With that being said I do believe that the blisters have ruptured and unfortunately did not just reattach I will remove some of  the blistered tissue today. With that being said I do think the wound itself on the plantar aspect of left foot does need to have sharp debridement. Electronic Signature(s) Signed: 05/08/2020 3:58:58 PM By: Worthy Keeler PA-C Previous Signature: 05/08/2020 3:22:09 PM Version By: Worthy Keeler PA-C Entered By: Worthy Keeler on 05/08/2020 15:58:58 -------------------------------------------------------------------------------- Physical Exam Details Patient Name: Date of Service: [place], [name] 05/08/2020 2:15 PM Medical Record Number: [id number] Patient Account Number: [id number] Date of Birth/Sex: Treating RN: 12-Jun-1946 (74 y.o. [name] Other Clinician: Referring Provider: Treating Provider/Extender: Landis Martins Weeks in Treatment: 56 Constitutional Well-nourished and well-hydrated in no acute distress. Respiratory normal breathing without difficulty. Psychiatric this patient is able to make decisions and demonstrates good insight into disease process. Alert and Oriented x 3. pleasant and cooperative. Notes Upon inspection patient's wound bed actually showed signs of good granulation at this time. There does not appear to be any evidence of active infection which is great news and overall very pleased in this regard. Sharp debridement was performed of the left plantar foot which she tolerated without any pain or complication I remove callus and some slough/necrotic tissue from the surface of the wound down to good subcutaneous tissue. Electronic Signature(s) Signed: 05/08/2020 3:59:29 PM By: Worthy Keeler PA-C Entered By: Worthy Keeler on 05/08/2020 15:59:28 -------------------------------------------------------------------------------- Physician Orders Details Patient Name: Date of Service: [place], [name] 05/08/2020 2:15 PM Medical Record Number: [id number] Patient Account Number: [id number] Date of  Birth/Sex: Treating RN: October 06, 1945 (74 y.o. [name] Other Clinician: Referring Provider: Treating Provider/Extender: Adele Schilder in Treatment: [phone] Verbal / Phone Orders: No Diagnosis Coding ICD-10 Coding Code Description E11.621 Type 2 diabetes mellitus with foot ulcer L97.522 Non-pressure chronic ulcer of other part of left foot with fat layer exposed M14.672 Charcot's joint, left ankle and foot E11.40 Type 2 diabetes mellitus with diabetic neuropathy, unspecified I10 Essential (primary) hypertension Follow-up Appointments ppointment in 1 week. - with Margarita Grizzle Return A Dressing Change Frequency Wound #1 Left,Medial,Plantar Foot Do not change entire dressing for one week. Wound #2 Left,Distal,Medial T Second oe Change Dressing every other day. Wound #3 Left T Great oe Change Dressing every other day. Wound #4 Right T Great oe Change Dressing every other day. Wound #5 Right,Distal,Dorsal Foot Do not change entire dressing for one week. Skin Barriers/Peri-Wound Care Moisturizing lotion - to both lefs Wound Cleansing Wound #1 Left,Medial,Plantar Foot May shower with protection. Primary Wound Dressing Wound #1 Left,Medial,Plantar Foot Silver Collagen - moisten with hydrogel Other: -  gentamycin ointment to wound bed under alginate Wound #2 Left,Distal,Medial T Second oe Calcium Alginate with Silver Wound #3 Left T Great oe Calcium Alginate with Silver Wound #4 Right T Great oe Calcium Alginate with Silver Wound #5 Right,Distal,Dorsal Foot Calcium Alginate with Silver Secondary Dressing Wound #1 Left,Medial,Plantar Foot Foam - donut Kerlix/Rolled Gauze Dry Gauze Drawtex - cut to fit inside wound to hold collagen in place Wound #2 Left,Distal,Medial T Second oe Kerlix/Rolled Gauze Dry Gauze Wound #3 Left T Great oe Kerlix/Rolled Gauze Dry Gauze Wound #4 Right T Great oe Kerlix/Rolled  Gauze Dry Gauze Edema Control 3 Layer Compression System - Bilateral Avoid standing for long periods of time Elevate legs to the level of the heart or above for 30 minutes daily and/or when sitting, a frequency of: Exercise regularly Off-Loading Open toe surgical shoe to: - left foot Electronic Signature(s) Signed: 05/08/2020 5:36:27 PM By: [name] RN, BSN Signed: 05/08/2020 5:47:16 PM By: Worthy Keeler PA-C Entered By: [name] on 05/08/2020 15:44:12 -------------------------------------------------------------------------------- Problem List Details Patient Name: Date of Service: [name] 05/08/2020 2:15 PM Medical Record Number: [id number] Patient Account Number: [id number] Date of Birth/Sex: Treating RN: [birth year] (74 y.o. [name] Other Clinician: Referring Provider: Treating Provider/Extender: Adele Schilder in Treatment: [phone] Active Problems ICD-10 Encounter Code Description Active Date MDM Diagnosis E11.621 Type 2 diabetes mellitus with foot ulcer 01/17/2020 No Yes L97.522 Non-pressure chronic ulcer of other part of left foot with fat layer exposed 01/17/2020 No Yes M14.672 Charcot's joint, left ankle and foot 01/17/2020 No Yes E11.40 Type 2 diabetes mellitus with diabetic neuropathy, unspecified 01/17/2020 No Yes I10 Essential (primary) hypertension 01/17/2020 No Yes Inactive Problems Resolved Problems Electronic Signature(s) Signed: 05/08/2020 2:25:39 PM By: Worthy Keeler PA-C Entered By: Worthy Keeler on 05/08/2020 14:25:38 -------------------------------------------------------------------------------- Progress Note Details Patient Name: Date of Service: Jarmon, Goodman RBA RA E. 05/08/2020 2:15 PM Medical Record Number: [id number] Patient Account Number: [id number] Date of Birth/Sex: Treating RN: Nov 22, 1945 (74 y.o. [name] Other  Clinician: Referring Provider: Treating Provider/Extender: Adele Schilder in Treatment: 16 Subjective Chief Complaint Information obtained from Patient Left foot ulcer History of Present Illness (HPI) 01/17/2020 upon evaluation today patient presents for initial evaluation here in our clinic concerning issues she has been having with a left medial/plantar foot ulcer. This is actually been an issue for her since October 2020. She has been seeing Dr. Doran Durand for quite some time during that course. Fortunately there is no signs of active infection at this time. Or least no mention of this to have seen in general. With that being said unfortunately I do see some signs of erythema noted today that does have me concerned about the possibility of infection at this point in the surrounding area of the wound. There is also a warm to touch at the site which is somewhat concerning. Fortunately there is no evidence of systemic infection which is great news. The patient does have a history of diabetes mellitus type 2, Charcot foot which is what led to the wound, and hypertension. She notes that she was in a cast for some time with Dr. Doran Durand for about 8 weeks. During that time they were utilizing according to the patient silver nitrate along with a foam doughnut and then Coban to secure in place in the cast in place. With that being said I do not have  the actual records to review we are going to try to get a hold of those unfortunately they would not flow over into care everywhere I did look today. She has been seeing Dr. Doran Durand and his physician assistant Larkin Ina up until the end of May and apparently is still seeing them on a regular basis every 2 weeks roughly. She has also tried Iodosorb without effect here. 01/24/2020 upon evaluation today patient actually appears to be doing quite well with regard to her wounds. She has been tolerating the dressing changes without complication.  Fortunately there is no signs of active infection spreading which is good news. Her culture did show signs of Staph aureus I did place her on Augmentin due to the erythema surrounding the wound. With that being said the wound does appear to be doing better she has her longer walking cast/boot and I think that is actually good for her for the time being. I am considering reinitiating total contact cast when she gets back from vacation but next week she will actually be out of town at [employer] she knows not to get in the water but she still obviously is planning to enjoy herself she is going to take it easy on her foot however. 02/07/2020 upon evaluation today patient appears to be doing fairly well in regard to her ulcer on her foot. Fortunately there is no signs of severe infection at this time which is great news and overall very pleased in that regard. With that being said I do think that she could still benefit from a total contact cast. Nonetheless she is using her walking boot which at least provide some protection and that it prevents some of the friction occurring when she is ambulating. 02/14/2020 upon evaluation today patient appears to be doing well with regard to her foot ulcer. This is actually measuring a little bit smaller yet again this week. Overall very pleased with where things stand and I do not see any signs of active infection at this time which is also good news. Since she is measuring better the patient has wanting to somewhat hold off on proceeding with the total contact cast which I think is reasonable at this point. 02/28/2020 on evaluation today patient appears to be doing well in general in regard to her wound although she has a lot of callus buildup as compared to last time I saw her. This is can require sharp debridement today. I do believe she really needs the total contact cast as well which we have discussed previous. 7/23; patient comes in for a total contact cast  change 03/06/2020 on evaluation today patient appears to be doing quite well with regard to her wounds. Fortunately the wound bed is measuring smaller and looking much better there is little callus noted although there is some debridement necessary today. 03/13/2020 on evaluation today patient's wound actually appears to be doing excellent which is great news. With that being said unfortunately she is having some issues currently with her left leg where she does have cellulitis it appears. This may have come from an area that rubbed underneath the cast from last week that we noted we padded that area and it looks to be doing excellent at this point but nonetheless the leg was somewhat painful, swollen, and somewhat erythematous. She also had an elevated white blood cell count of 11.5 based on what I saw on looking at her records from the med center in [facility] from where she was seen  yesterday. Unfortunately with [place] having a provider on vacation there was no one here in the clinic in the afternoon when she called therefore she went to the ER as advised. Subsequently they did not cut off the cast as they did not have anyone from orthopedics there to do so and subsequently also did not have the ability to do the Doppler for evaluation of DVT They recommended therefore given her dose of Eliquis as well as . Augmentin and sent her home to come see [place] today to have the cast taken off and then she is supposed to go back to have the study for DVT performed they are following. 03/20/2020 upon evaluation today patient appears to be doing well with regard to her foot all things considered we have not been able to use the total contact cast due to the infection that she had last week. She has been on the doxycycline and she had a 10-day supply of that I do believe that is helping and her leg appears to be doing better. With that being said there is fortunately no signs of active infection systemically at this time  which is good news. No fevers, chills, nausea, vomiting, or diarrhea. 03/27/2020 upon evaluation today patient appears to be doing well with regard to her foot ulcer. There does not appear to be signs of active infection which is great news. Overall I am very pleased with where things stand at this point. 04/03/2020 upon evaluation today patient appears to be doing pretty well in regard to the overall appearance of her wound. Fortunately there is no signs of active infection at this time which is great news. No fevers, chills, nausea, vomiting, or diarrhea. With that being said she does have some blue-green drainage that actually is a little bit concerning to me for the possibility of Pseudomonas. I discussed that with the patient today. With that being said I do believe that we may be able to manage this however with the topical antibiotic cream as opposed to having to do anything oral especially since she seems to be doing so well with overall appearance of the wound. 04/10/2020 on evaluation today patient appears to be doing about the same roughly in regard to the overall size of her wound. With that being said she fortunately has not shown any signs of worsening overall which is good news. I do believe that she is doing a great job trying to offload but again she may still do better with the cast. I do not see in the blue-green drainage that we noticed previously I do believe the gentamicin help in this regard. 04/17/2020 on evaluation today patient's wound appears to be doing about the same at this point. There is no significant improvement at this point. No fever chills noted. She is up for put the cast back on the day. That she states in a couple weeks she will need to have this off to go to a workshop. 04/24/2020 on evaluation today patient appears to be doing significantly better in regard to her wound. Fortunately there is no signs of active infection and overall feel like she is making great  progress the cast seems to have done excellent for her. 05/01/2020 upon evaluation today patient presents for reevaluation she really does not appear to be doing too badly in regard to the actual wound on the left foot we have been managing. Unfortunately she has bilateral lower extremity edema with blisters between the webspace of her first and second  toe on both feet. She has a tremendous amount of edema in the legs which I think is where this is coming from it does not appear to be infected but nonetheless I do believe this is can be something that needs to be addressed today. Obviously this means we probably will not be putting the cast on at this point. She attributes this to the fact that she was sitting with her feet on the floor much longer during a conference last week she had a great time but unfortunately had a lot of complications as a result. 05/08/2020 upon evaluation today patient appears to be doing somewhat better in regard to her wounds at this time. Fortunately there is no signs of active infection which is great news. With that being said I do believe that the blisters have ruptured and unfortunately did not just reattach I will remove some of the blistered tissue today. With that being said I do think the wound itself on the plantar aspect of left foot does need to have sharp debridement. Objective Constitutional Well-nourished and well-hydrated in no acute distress. Vitals Time Taken: 3:18 PM, Height: 66 in, Weight: 245 lbs, BMI: 39.5, Temperature: 98.6 F, Pulse: 123 bpm, Respiratory Rate: 19 breaths/min, Blood Pressure: 151/72 mmHg. Respiratory normal breathing without difficulty. Psychiatric this patient is able to make decisions and demonstrates good insight into disease process. Alert and Oriented x 3. pleasant and cooperative. General Notes: Upon inspection patient's wound bed actually showed signs of good granulation at this time. There does not appear to be any  evidence of active infection which is great news and overall very pleased in this regard. Sharp debridement was performed of the left plantar foot which she tolerated without any pain or complication I remove callus and some slough/necrotic tissue from the surface of the wound down to good subcutaneous tissue. Integumentary (Hair, Skin) Wound #1 status is Open. Original cause of wound was Gradually Appeared. The wound is located on the Miner. The wound measures 0.6cm length x 0.5cm width x 0.7cm depth; 0.236cm^2 area and 0.165cm^3 volume. There is Fat Layer (Subcutaneous Tissue) exposed. There is no tunneling or undermining noted. There is a small amount of serous drainage noted. The wound margin is well defined and not attached to the wound base. There is large (67- 100%) pink granulation within the wound bed. There is no necrotic tissue within the wound bed. Wound #2 status is Open. Original cause of wound was Shear/Friction. The wound is located on the Left,Distal,Medial T Second. The wound measures 2cm oe length x 2.2cm width x 0.1cm depth; 3.456cm^2 area and 0.346cm^3 volume. There is Fat Layer (Subcutaneous Tissue) exposed. There is no tunneling or undermining noted. There is a medium amount of serous drainage noted. The wound margin is distinct with the outline attached to the wound base. There is large (67-100%) pink, pale granulation within the wound bed. There is no necrotic tissue within the wound bed. Wound #3 status is Open. Original cause of wound was Blister. The wound is located on the Left T Great. The wound measures 2cm length x 1.1cm width x oe 0.1cm depth; 1.728cm^2 area and 0.173cm^3 volume. There is Fat Layer (Subcutaneous Tissue) exposed. There is no tunneling or undermining noted. There is a small amount of serous drainage noted. The wound margin is distinct with the outline attached to the wound base. There is large (67-100%) pink granulation within the  wound bed. There is no necrotic tissue within the wound bed. Wound #  4 status is Open. Original cause of wound was Blister. The wound is located on the Right T Great. The wound measures 3cm length x 3cm width x oe 0.1cm depth; 7.069cm^2 area and 0.707cm^3 volume. There is Fat Layer (Subcutaneous Tissue) exposed. There is no tunneling or undermining noted. There is a medium amount of serous drainage noted. The wound margin is distinct with the outline attached to the wound base. There is large (67-100%) pink granulation within the wound bed. There is no necrotic tissue within the wound bed. Wound #5 status is Open. Original cause of wound was Blister. The wound is located on the Right,Distal,Dorsal Foot. The wound measures 2.3cm length x 4cm width x 0.1cm depth; 7.226cm^2 area and 0.723cm^3 volume. There is Fat Layer (Subcutaneous Tissue) exposed. There is no tunneling or undermining noted. There is a small amount of serous drainage noted. The wound margin is distinct with the outline attached to the wound base. There is large (67-100%) pink granulation within the wound bed. There is no necrotic tissue within the wound bed. Assessment Active Problems ICD-10 Type 2 diabetes mellitus with foot ulcer Non-pressure chronic ulcer of other part of left foot with fat layer exposed Charcot's joint, left ankle and foot Type 2 diabetes mellitus with diabetic neuropathy, unspecified Essential (primary) hypertension Procedures Wound #1 Pre-procedure diagnosis of Wound #1 is a Diabetic Wound/Ulcer of the Lower Extremity located on the Left,Medial,Plantar Foot .Severity of Tissue Pre Debridement is: Fat layer exposed. There was a Excisional Skin/Subcutaneous Tissue Debridement with a total area of 1 sq cm performed by Worthy Keeler, PA. With the following instrument(s): Curette to remove Viable and Non-Viable tissue/material. Material removed includes Callus, Subcutaneous Tissue, Slough, and Skin: Epidermis.  No specimens were taken. A time out was conducted at 15:35, prior to the start of the procedure. A Minimum amount of bleeding was controlled with Pressure. The procedure was tolerated well with a pain level of 0 throughout and a pain level of 0 following the procedure. Post Debridement Measurements: 0.6cm length x 0.5cm width x 0.7cm depth; 0.165cm^3 volume. Character of Wound/Ulcer Post Debridement is improved. Severity of Tissue Post Debridement is: Fat layer exposed. Post procedure Diagnosis Wound #1: Same as Pre-Procedure There was a Three Layer Compression Therapy Procedure by Carlene Coria, RN. Post procedure Diagnosis Wound #: Same as Pre-Procedure There was a Three Layer Compression Therapy Procedure by Carlene Coria, RN. Post procedure Diagnosis Wound #: Same as Pre-Procedure Plan Follow-up Appointments: Return Appointment in 1 week. - with Margarita Grizzle Dressing Change Frequency: Wound #1 Left,Medial,Plantar Foot: Do not change entire dressing for one week. Wound #2 Left,Distal,Medial T Second: oe Change Dressing every other day. Wound #3 Left T Great: oe Change Dressing every other day. Wound #4 Right T Great: oe Change Dressing every other day. Wound #5 Right,Distal,Dorsal Foot: Do not change entire dressing for one week. Skin Barriers/Peri-Wound Care: Moisturizing lotion - to both lefs Wound Cleansing: Wound #1 Left,Medial,Plantar Foot: May shower with protection. Primary Wound Dressing: Wound #1 Left,Medial,Plantar Foot: Silver Collagen - moisten with hydrogel Other: - gentamycin ointment to wound bed under alginate Wound #2 Left,Distal,Medial T Second: oe Calcium Alginate with Silver Wound #3 Left T Great: oe Calcium Alginate with Silver Wound #4 Right T Great: oe Calcium Alginate with Silver Wound #5 Right,Distal,Dorsal Foot: Calcium Alginate with Silver Secondary Dressing: Wound #1 Left,Medial,Plantar Foot: Foam - donut Kerlix/Rolled Gauze Dry Gauze Drawtex -  cut to fit inside wound to hold collagen in place Wound #2 Left,Distal,Medial T  Second: oe Kerlix/Rolled Gauze Dry Gauze Wound #3 Left T Great: oe Kerlix/Rolled Gauze Dry Gauze Wound #4 Right T Great: oe Kerlix/Rolled Gauze Dry Gauze Edema Control: 3 Layer Compression System - Bilateral Avoid standing for long periods of time Elevate legs to the level of the heart or above for 30 minutes daily and/or when sitting, a frequency of: Exercise regularly Off-Loading: Open toe surgical shoe to: - left foot 1. I would recommend currently that we go ahead and initiate treatment with a silver collagen dressing to the left plantar foot I think this is good to be the best way to go. 2. On the other areas will use an alginate dressing which I think will help keep these blistered/open areas nice and dry. 3. We will get a continue as well for now with the 3 layer compression wrap which I do think is helping her at this point. We will see patient back for reevaluation in 1 week here in the clinic. If anything worsens or changes patient will contact our office for additional recommendations. Electronic Signature(s) Signed: 05/08/2020 4:00:25 PM By: Worthy Keeler PA-C Entered By: Worthy Keeler on 05/08/2020 16:00:25 -------------------------------------------------------------------------------- SuperBill Details Patient Name: Date of Service: Pippenger, [name] 05/08/2020 Medical Record Number: [id number] Patient Account Number: [id number] Date of Birth/Sex: Treating RN: 1946/02/26 (74 y.o. [name] Other Clinician: Referring Provider: Treating Provider/Extender: Adele Schilder in Treatment: 16 Diagnosis Coding ICD-10 Codes Code Description E11.621 Type 2 diabetes mellitus with foot ulcer L97.522 Non-pressure chronic ulcer of other part of left foot with fat layer exposed M14.672 Charcot's joint, left ankle and  foot E11.40 Type 2 diabetes mellitus with diabetic neuropathy, unspecified I10 Essential (primary) hypertension Facility Procedures CPT4 Code: [id number] Description: 83338 - DEB SUBQ TISSUE 20 SQ CM/< ICD-10 Diagnosis Description L97.522 Non-pressure chronic ulcer of other part of left foot with fat layer exposed Modifier: Quantity: 1 Physician Procedures : CPT4 Code Description Modifier [id number] - WC PHYS SUBQ TISS 20 SQ CM ICD-10 Diagnosis Description L97.522 Non-pressure chronic ulcer of other part of left foot with fat layer exposed Quantity: 1 Electronic Signature(s) Signed: 05/08/2020 4:00:36 PM By: Worthy Keeler PA-C Entered By: Worthy Keeler on 05/08/2020 16:00:34

## 2020-05-08 NOTE — Progress Notes (Signed)
MCKELL, RIECKE ([id number]) Visit Report for 05/08/2020 Arrival Information Details Patient Name: Date of Service: [name] [name], [name] E. 05/08/2020 2:15 PM Medical Record Number: [id number] Patient Account Number: [id number] Date of Birth/Sex: Treating RN: [birth year] (74 y.o. Hollie Salk, Larene Beach Primary Care Anjelita Sheahan: [name] Other Clinician: Referring Shavonna Corella: Treating Kerwin Augustus/Extender: Adele Schilder in Treatment: 16 Visit Information History Since Last Visit Added or deleted any medications: No Patient Arrived: Wheel Chair Any new allergies or adverse reactions: No Arrival Time: 15:17 Had a fall or experienced change in No Accompanied By: self activities of daily living that may affect Transfer Assistance: None risk of falls: Patient Identification Verified: Yes Signs or symptoms of abuse/neglect since last visito No Secondary Verification Process Completed: Yes Hospitalized since last visit: No Patient Requires Transmission-Based Precautions: No Implantable device outside of the clinic excluding No Patient Has Alerts: No cellular tissue based products placed in the center since last visit: Has Dressing in Place as Prescribed: Yes Has Compression in Place as Prescribed: Yes Pain Present Now: No Electronic Signature(s) Signed: 05/08/2020 5:07:30 PM By: Kela Millin Entered By: Kela Millin on 05/08/2020 15:18:09 -------------------------------------------------------------------------------- Compression Therapy Details Patient Name: Date of Service: [name] Amato RA E. 05/08/2020 2:15 PM Medical Record Number: [id number] Patient Account Number: [id number] Date of Birth/Sex: Treating RN: [birth year] (74 y.o. [name] Primary Care Jilian West: [name] Other Clinician: Referring Josee Speece: Treating Doug Bucklin/Extender: Adele Schilder in Treatment: 16 Compression Therapy Performed for Wound Assessment: NonWound  Condition Lymphedema - Right Leg Performed By: Clinician Carlene Coria, RN Compression Type: Three Layer Post Procedure Diagnosis Same as Pre-procedure Electronic Signature(s) Signed: 05/08/2020 5:36:27 PM By: [name] RN, BSN Entered By: [name] on 05/08/2020 15:33:20 -------------------------------------------------------------------------------- Compression Therapy Details Patient Name: Date of Service: [name] Amato RA E. 05/08/2020 2:15 PM Medical Record Number: [id number] Patient Account Number: [id number] Date of Birth/Sex: Treating RN: [birth year] (74 y.o. [name] Primary Care Shatiqua Heroux: [name] Other Clinician: Referring Jennfier Abdulla: Treating Ariabella Brien/Extender: Adele Schilder in Treatment: 16 Compression Therapy Performed for Wound Assessment: NonWound Condition Lymphedema - Left Leg Performed By: Clinician Carlene Coria, RN Compression Type: Three Layer Post Procedure Diagnosis Same as Pre-procedure Electronic Signature(s) Signed: 05/08/2020 5:36:27 PM By: [name] RN, BSN Entered By: [name] on 05/08/2020 15:34:00 -------------------------------------------------------------------------------- Encounter Discharge Information Details Patient Name: Date of Service: [name] Guarneri RBA RA E. 05/08/2020 2:15 PM Medical Record Number: [id number] Patient Account Number: [id number] Date of Birth/Sex: Treating RN: [birth year] (74 y.o. [name] Primary Care Arnecia Ector: [name] Other Clinician: Referring Nasario Czerniak: Treating Chaun Uemura/Extender: Adele Schilder in Treatment: [phone] Encounter Discharge Information Items Post Procedure Vitals Discharge Condition: Stable Temperature (F): 98.6 Ambulatory Status: Wheelchair Pulse (bpm): 123 Discharge Destination: Home Respiratory Rate (breaths/min): 19 Transportation: Private Auto Blood Pressure (mmHg): 151/72 Accompanied By: self Schedule  Follow-up Appointment: Yes Clinical Summary of Care: Patient Declined Electronic Signature(s) Signed: 05/08/2020 5:07:30 PM By: Kela Millin Entered By: Kela Millin on 05/08/2020 16:16:49 -------------------------------------------------------------------------------- Lower Extremity Assessment Details Patient Name: Date of Service: [name] Amato RA E. 05/08/2020 2:15 PM Medical Record Number: [id number] Patient Account Number: [id number] Date of Birth/Sex: Treating RN: 1946/05/10 (74 y.o. [name] Primary Care Kenniya Westrich: [name] Other Clinician: Referring Merlinda Wrubel: Treating Kewanna Kasprzak/Extender: Landis Martins Weeks in Treatment: 16 Edema Assessment Assessed: [Left: No] [Right: No] Edema: [Left: Ye] [Right: s] Calf Left: Right: Point of Measurement: 35 cm  From Medial Instep 40.5 cm 39 cm Ankle Left: Right: Point of Measurement: 9 cm From Medial Instep 22.5 cm 23.5 cm Vascular Assessment Pulses: Dorsalis Pedis Palpable: [Left:No] [Right:No] Electronic Signature(s) Signed: 05/08/2020 5:07:30 PM By: Kela Millin Entered By: Kela Millin on 05/08/2020 15:19:08 -------------------------------------------------------------------------------- Multi-Disciplinary Care Plan Details Patient Name: Date of Service: [name] Amato RA E. 05/08/2020 2:15 PM Medical Record Number: [id number] Patient Account Number: [id number] Date of Birth/Sex: Treating RN: 11-24-1945 (74 y.o. [name] Primary Care Lavonda Thal: [name] Other Clinician: Referring Aadon Gorelik: Treating Yechezkel Fertig/Extender: Adele Schilder in Treatment: [phone] Active Inactive Nutrition Nursing Diagnoses: Impaired glucose control: actual or potential Potential for alteratiion in Nutrition/Potential for imbalanced nutrition Goals: Patient/caregiver verbalizes understanding of need to maintain therapeutic glucose control per primary care physician Date  Initiated: 01/17/2020 Date Inactivated: 02/14/2020 Target Resolution Date: 02/14/2020 Goal Status: Met Patient/caregiver will maintain therapeutic glucose control Date Initiated: 01/17/2020 Target Resolution Date: 06/05/2020 Goal Status: Active Interventions: Assess HgA1c results as ordered upon admission and as needed Assess patient nutrition upon admission and as needed per policy Provide education on elevated blood sugars and impact on wound healing Treatment Activities: Patient referred to Primary Care Physician for further nutritional evaluation : 01/17/2020 Notes: Wound/Skin Impairment Nursing Diagnoses: Impaired tissue integrity Knowledge deficit related to ulceration/compromised skin integrity Goals: Patient/caregiver will verbalize understanding of skin care regimen Date Initiated: 01/17/2020 Target Resolution Date: 06/05/2020 Goal Status: Active Ulcer/skin breakdown will have a volume reduction of 30% by week 4 Date Initiated: 01/17/2020 Date Inactivated: 02/14/2020 Target Resolution Date: 02/14/2020 Goal Status: Met Ulcer/skin breakdown will have a volume reduction of 50% by week 8 Date Initiated: 02/14/2020 Date Inactivated: 03/13/2020 Target Resolution Date: 03/13/2020 Goal Status: Met Ulcer/skin breakdown will have a volume reduction of 80% by week 12 Date Initiated: 03/13/2020 Date Inactivated: 04/10/2020 Target Resolution Date: 04/10/2020 Goal Status: Unmet Unmet Reason: infection Interventions: Assess patient/caregiver ability to obtain necessary supplies Assess patient/caregiver ability to perform ulcer/skin care regimen upon admission and as needed Assess ulceration(s) every visit Provide education on ulcer and skin care Treatment Activities: Skin care regimen initiated : 01/17/2020 Topical wound management initiated : 01/17/2020 Notes: Electronic Signature(s) Signed: 05/08/2020 5:36:27 PM By: [name] RN, BSN Entered By: [name] on 05/08/2020  15:29:54 -------------------------------------------------------------------------------- Pain Assessment Details Patient Name: Date of Service: [name] Amato RA E. 05/08/2020 2:15 PM Medical Record Number: [id number] Patient Account Number: [id number] Date of Birth/Sex: Treating RN: June 15, 1946 (74 y.o. [name] Primary Care Elveria Lauderbaugh: [name] Other Clinician: Referring Ruxin Ransome: Treating Kiyah Demartini/Extender: Adele Schilder in Treatment: 16 Active Problems Location of Pain Severity and Description of Pain Patient Has Paino No Site Locations Pain Management and Medication Current Pain Management: Electronic Signature(s) Signed: 05/08/2020 5:07:30 PM By: Kela Millin Entered By: Kela Millin on 05/08/2020 15:18:38 -------------------------------------------------------------------------------- Patient/Caregiver Education Details Patient Name: Date of Service: [name] Amato RA E. 9/29/2021andnbsp2:15 PM Medical Record Number: [id number] Patient Account Number: [id number] Date of Birth/Gender: Treating RN: 07-17-1946 (74 y.o. [name] Primary Care Physician: [name] Other Clinician: Referring Physician: Treating Physician/Extender: Adele Schilder in Treatment: 16 Education Assessment Education Provided To: Patient Education Topics Provided Elevated Blood Sugar/ Impact on Healing: Methods: Explain/Verbal Responses: Reinforcements needed, State content correctly Offloading: Methods: Explain/Verbal Responses: Reinforcements needed, State content correctly Wound/Skin Impairment: Methods: Explain/Verbal Responses: Reinforcements needed, State content correctly Electronic Signature(s) Signed: 05/08/2020 5:36:27 PM By: [name] RN, BSN Entered By: [name] on 05/08/2020  15:31:14 -------------------------------------------------------------------------------- Wound Assessment  Details Patient Name: Date of Service: PHILICIA, HEYNE RA E. 05/08/2020 2:15 PM Medical Record Number: [id number] Patient Account Number: [id number] Date of Birth/Sex: Treating RN: 25-Feb-1946 (74 y.o. [name] Primary Care Tonee Silverstein: [name] Other Clinician: Referring Yanni Quiroa: Treating Avri Paiva/Extender: Landis Martins Weeks in Treatment: 16 Wound Status Wound Number: 1 Primary Diabetic Wound/Ulcer of the Lower Extremity Etiology: Wound Location: Left, Medial, Plantar Foot Wound Open Wounding Event: Gradually Appeared Status: Date Acquired: 05/11/2019 Comorbid Sleep Apnea, Deep Vein Thrombosis, Hypertension, Colitis, Type Weeks Of Treatment: 16 History: II Diabetes, Gout, Osteoarthritis, Neuropathy Clustered Wound: No Wound Measurements Length: (cm) 0.6 Width: (cm) 0.5 Depth: (cm) 0.7 Area: (cm) 0.236 Volume: (cm) 0.165 % Reduction in Area: 76.9% % Reduction in Volume: 59.6% Epithelialization: Medium (34-66%) Tunneling: No Undermining: No Wound Description Classification: Grade 2 Wound Margin: Well defined, not attached Exudate Amount: Small Exudate Type: Serous Exudate Color: amber Foul Odor After Cleansing: No Slough/Fibrino No Wound Bed Granulation Amount: Large (67-100%) Exposed Structure Granulation Quality: Pink Fascia Exposed: No Necrotic Amount: None Present (0%) Fat Layer (Subcutaneous Tissue) Exposed: Yes Tendon Exposed: No Muscle Exposed: No Joint Exposed: No Bone Exposed: No Treatment Notes Wound #1 (Left, Medial, Plantar Foot) 1. Cleanse With Wound Cleanser Soap and water 2. Periwound Care Moisturizing lotion 3. Primary Dressing Applied Calcium Alginate Ag Collegen AG 4. Secondary Dressing Dry Gauze Foam 6. Support Layer Applied 3 layer compression wrap Notes silver alginate to dorsal foot and collagen to left plantar foot. Electronic Signature(s) Signed: 05/08/2020 5:07:30 PM By: Kela Millin Entered  By: Kela Millin on 05/08/2020 15:19:38 -------------------------------------------------------------------------------- Wound Assessment Details Patient Name: Date of Service: [name] Amato RA E. 05/08/2020 2:15 PM Medical Record Number: [id number] Patient Account Number: [id number] Date of Birth/Sex: Treating RN: October 07, 1945 (74 y.o. [name] Primary Care Hans Rusher: [name] Other Clinician: Referring Naveen Lorusso: Treating Anupama Piehl/Extender: Landis Martins Weeks in Treatment: 16 Wound Status Wound Number: 2 Primary Abrasion Etiology: Wound Location: Left, Distal, Medial T Second oe Wound Open Wounding Event: Shear/Friction Status: Date Acquired: 05/01/2020 Comorbid Sleep Apnea, Deep Vein Thrombosis, Hypertension, Colitis, Type Weeks Of Treatment: 1 History: II Diabetes, Gout, Osteoarthritis, Neuropathy Clustered Wound: No Wound Measurements Length: (cm) 2 Width: (cm) 2.2 Depth: (cm) 0.1 Area: (cm) 3.456 Volume: (cm) 0.346 % Reduction in Area: -2101.3% % Reduction in Volume: -2062.5% Epithelialization: Small (1-33%) Tunneling: No Undermining: No Wound Description Classification: Full Thickness Without Exposed Support Structures Wound Margin: Distinct, outline attached Exudate Amount: Medium Exudate Type: Serous Exudate Color: amber Foul Odor After Cleansing: No Slough/Fibrino No Wound Bed Granulation Amount: Large (67-100%) Exposed Structure Granulation Quality: Pink, Pale Fascia Exposed: No Necrotic Amount: None Present (0%) Fat Layer (Subcutaneous Tissue) Exposed: Yes Tendon Exposed: No Muscle Exposed: No Joint Exposed: No Bone Exposed: No Treatment Notes Wound #2 (Left, Distal, Medial Toe Second) 1. Cleanse With Wound Cleanser Soap and water 3. Primary Dressing Applied Calcium Alginate Ag 4. Secondary Dressing Dry Gauze 5. Secured With Recruitment consultant) Signed: 05/08/2020 5:07:30 PM By: Kela Millin Entered By: Kela Millin on 05/08/2020 15:20:12 -------------------------------------------------------------------------------- Wound Assessment Details Patient Name: Date of Service: [name] Guarneri RBA RA E. 05/08/2020 2:15 PM Medical Record Number: [id number] Patient Account Number: [id number] Date of Birth/Sex: Treating RN: 15-Aug-1945 (74 y.o. [name] Primary Care Jaja Switalski: [name] Other Clinician: Referring Lakevia Perris: Treating Stela Iwasaki/Extender: Landis Martins Weeks in Treatment: 16 Wound Status Wound Number: 3 Primary Diabetic Wound/Ulcer of  the Lower Extremity Etiology: Wound Location: Left T Great oe Wound Open Wounding Event: Blister Status: Date Acquired: 05/08/2020 Comorbid Sleep Apnea, Deep Vein Thrombosis, Hypertension, Colitis, Type Weeks Of Treatment: 0 History: II Diabetes, Gout, Osteoarthritis, Neuropathy Clustered Wound: No Wound Measurements Length: (cm) 2 Width: (cm) 1.1 Depth: (cm) 0.1 Area: (cm) 1.728 Volume: (cm) 0.173 % Reduction in Area: % Reduction in Volume: Epithelialization: None Tunneling: No Undermining: No Wound Description Classification: Grade 2 Wound Margin: Distinct, outline attached Exudate Amount: Small Exudate Type: Serous Exudate Color: amber Foul Odor After Cleansing: No Slough/Fibrino No Wound Bed Granulation Amount: Large (67-100%) Exposed Structure Granulation Quality: Pink Fascia Exposed: No Necrotic Amount: None Present (0%) Fat Layer (Subcutaneous Tissue) Exposed: Yes Tendon Exposed: No Muscle Exposed: No Joint Exposed: No Bone Exposed: No Treatment Notes Wound #3 (Left Toe Great) 1. Cleanse With Wound Cleanser Soap and water 3. Primary Dressing Applied Calcium Alginate Ag 4. Secondary Dressing Dry Gauze 5. Secured With Recruitment consultant) Signed: 05/08/2020 5:07:30 PM By: Kela Millin Entered By: Kela Millin on 05/08/2020  15:21:51 -------------------------------------------------------------------------------- Wound Assessment Details Patient Name: Date of Service: [name] Amato RA E. 05/08/2020 2:15 PM Medical Record Number: [id number] Patient Account Number: [id number] Date of Birth/Sex: Treating RN: [birth year] (74 y.o. [name] Primary Care Haylei Cobin: [name] Other Clinician: Referring Mavis Fichera: Treating Morrell Fluke/Extender: Landis Martins Weeks in Treatment: 16 Wound Status Wound Number: 4 Primary Diabetic Wound/Ulcer of the Lower Extremity Etiology: Wound Location: Right T Great oe Wound Open Wounding Event: Blister Status: Date Acquired: 05/08/2020 Comorbid Sleep Apnea, Deep Vein Thrombosis, Hypertension, Colitis, Type Weeks Of Treatment: 0 History: II Diabetes, Gout, Osteoarthritis, Neuropathy Clustered Wound: No Wound Measurements Length: (cm) 3 Width: (cm) 3 Depth: (cm) 0.1 Area: (cm) 7.069 Volume: (cm) 0.707 % Reduction in Area: % Reduction in Volume: Epithelialization: None Tunneling: No Undermining: No Wound Description Classification: Grade 2 Wound Margin: Distinct, outline attached Exudate Amount: Medium Exudate Type: Serous Exudate Color: amber Foul Odor After Cleansing: No Slough/Fibrino No Wound Bed Granulation Amount: Large (67-100%) Exposed Structure Granulation Quality: Pink Fascia Exposed: No Necrotic Amount: None Present (0%) Fat Layer (Subcutaneous Tissue) Exposed: Yes Tendon Exposed: No Muscle Exposed: No Joint Exposed: No Bone Exposed: No Treatment Notes Wound #4 (Right Toe Great) 1. Cleanse With Wound Cleanser Soap and water 3. Primary Dressing Applied Calcium Alginate Ag 4. Secondary Dressing Dry Gauze 5. Secured With Recruitment consultant) Signed: 05/08/2020 5:07:30 PM By: Kela Millin Entered By: Kela Millin on 05/08/2020  15:22:39 -------------------------------------------------------------------------------- Wound Assessment Details Patient Name: Date of Service: [name] Guarneri RBA RA E. 05/08/2020 2:15 PM Medical Record Number: [id number] Patient Account Number: [id number] Date of Birth/Sex: Treating RN: 1945/11/15 (74 y.o. [name] Primary Care Harveer Sadler: [name] Other Clinician: Referring Zala Degrasse: Treating Christopherjame Carnell/Extender: Landis Martins Weeks in Treatment: 16 Wound Status Wound Number: 5 Primary Diabetic Wound/Ulcer of the Lower Extremity Etiology: Wound Location: Right, Distal, Dorsal Foot Wound Open Wounding Event: Blister Status: Date Acquired: 05/08/2020 Comorbid Sleep Apnea, Deep Vein Thrombosis, Hypertension, Colitis, Type Weeks Of Treatment: 0 History: II Diabetes, Gout, Osteoarthritis, Neuropathy Clustered Wound: No Wound Measurements Length: (cm) 2.3 Width: (cm) 4 Depth: (cm) 0.1 Area: (cm) 7.226 Volume: (cm) 0.723 % Reduction in Area: % Reduction in Volume: Epithelialization: None Tunneling: No Undermining: No Wound Description Classification: Grade 2 Wound Margin: Distinct, outline attached Exudate Amount: Small Exudate Type: Serous Exudate Color: amber Foul Odor After Cleansing: No Slough/Fibrino No Wound Bed Granulation Amount: Large (67-100%)  Exposed Structure Granulation Quality: Pink Fascia Exposed: No Necrotic Amount: None Present (0%) Fat Layer (Subcutaneous Tissue) Exposed: Yes Tendon Exposed: No Muscle Exposed: No Joint Exposed: No Bone Exposed: No Treatment Notes Wound #5 (Right, Distal, Dorsal Foot) 1. Cleanse With Wound Cleanser Soap and water 2. Periwound Care Moisturizing lotion 3. Primary Dressing Applied Calcium Alginate Ag Collegen AG 4. Secondary Dressing Dry Gauze Foam 6. Support Layer Applied 3 layer compression wrap Notes silver alginate to dorsal foot and collagen to left plantar foot. Electronic  Signature(s) Signed: 05/08/2020 5:07:30 PM By: Kela Millin Entered By: Kela Millin on 05/08/2020 15:23:30 -------------------------------------------------------------------------------- Vitals Details Patient Name: Date of Service: [name] Guarneri RBA RA E. 05/08/2020 2:15 PM Medical Record Number: [id number] Patient Account Number: [id number] Date of Birth/Sex: Treating RN: 01/05/1946 (74 y.o. [name] Primary Care Braeden Kennan: [name] Other Clinician: Referring Doristine Shehan: Treating Delyla Sandeen/Extender: Adele Schilder in Treatment: 16 Vital Signs Time Taken: 15:18 Temperature (F): 98.6 Height (in): 66 Pulse (bpm): 123 Weight (lbs): 245 Respiratory Rate (breaths/min): 19 Body Mass Index (BMI): 39.5 Blood Pressure (mmHg): 151/72 Reference Range: 80 - 120 mg / dl Electronic Signature(s) Signed: 05/08/2020 5:07:30 PM By: Kela Millin Entered By: Kela Millin on 05/08/2020 15:18:32

## 2020-05-11 ENCOUNTER — Encounter: Payer: Self-pay | Admitting: Internal Medicine

## 2020-05-15 ENCOUNTER — Encounter (HOSPITAL_BASED_OUTPATIENT_CLINIC_OR_DEPARTMENT_OTHER): Payer: Medicare Other | Attending: Physician Assistant | Admitting: Physician Assistant

## 2020-05-15 DIAGNOSIS — E11610 Type 2 diabetes mellitus with diabetic neuropathic arthropathy: Secondary | ICD-10-CM | POA: Insufficient documentation

## 2020-05-15 DIAGNOSIS — E1140 Type 2 diabetes mellitus with diabetic neuropathy, unspecified: Secondary | ICD-10-CM | POA: Insufficient documentation

## 2020-05-15 DIAGNOSIS — E11621 Type 2 diabetes mellitus with foot ulcer: Secondary | ICD-10-CM | POA: Insufficient documentation

## 2020-05-15 DIAGNOSIS — L97522 Non-pressure chronic ulcer of other part of left foot with fat layer exposed: Secondary | ICD-10-CM | POA: Diagnosis not present

## 2020-05-15 DIAGNOSIS — L97529 Non-pressure chronic ulcer of other part of left foot with unspecified severity: Secondary | ICD-10-CM | POA: Diagnosis present

## 2020-05-15 NOTE — Progress Notes (Signed)
[name] [name], [name] [name] ([id number]) Visit Report for 05/15/2020 Arrival Information Details Patient Name: Date of Service: [name] [name], [name] E. 05/15/2020 10:45 A M Medical Record Number: [id number] Patient Account Number: [id number] Date of Birth/Sex: Treating RN: 1945/10/21 (74 y.o. [name] [name], Millard.Loa Primary Care Donnamaria Shands: Cheryll Cockayne Other Clinician: Referring Ashir Kunz: Treating Lisel Siegrist/Extender: Rickard Patience in Treatment: 17 Visit Information History Since Last Visit Added or deleted any medications: No Patient Arrived: Wheel Chair Any new allergies or adverse reactions: No Arrival Time: 11:25 Had a fall or experienced change in No Accompanied By: self activities of daily living that may affect Transfer Assistance: None risk of falls: Patient Identification Verified: Yes Signs or symptoms of abuse/neglect since last visito No Secondary Verification Process Completed: Yes Hospitalized since last visit: No Patient Requires Transmission-Based Precautions: No Implantable device outside of the clinic excluding No Patient Has Alerts: No cellular tissue based products placed in the center since last visit: Has Dressing in Place as Prescribed: Yes Has Compression in Place as Prescribed: Yes Pain Present Now: No Electronic Signature(s) Signed: 05/15/2020 5:54:43 PM By: Shawn Stall Entered By: Shawn Stall on 05/15/2020 11:44:12 -------------------------------------------------------------------------------- Lower Extremity Assessment Details Patient Name: Date of Service: [name] [name], [name] E. 05/15/2020 10:45 A M Medical Record Number: [id number] Patient Account Number: [id number] Date of Birth/Sex: Treating RN: 12-02-1945 (74 y.o. [name] [name] Primary Care Kelan Pritt: Cheryll Cockayne Other Clinician: Referring Miko Markwood: Treating Shali Vesey/Extender: Atlee Abide Weeks in Treatment: 17 Edema Assessment Assessed: [Left: Yes] [Right: Yes] Edema: [Left:  Ye] [Right: s] Calf Left: Right: Point of Measurement: 35 cm From Medial Instep 38 cm 41 cm Ankle Left: Right: Point of Measurement: 9 cm From Medial Instep 21 cm 22 cm Electronic Signature(s) Signed: 05/15/2020 5:54:43 PM By: Shawn Stall Entered By: Shawn Stall on 05/15/2020 11:45:14 -------------------------------------------------------------------------------- Multi-Disciplinary Care Plan Details Patient Name: Date of Service: [name] [place] RA E. 05/15/2020 10:45 A M Medical Record Number: [id number] Patient Account Number: [id number] Date of Birth/Sex: Treating RN: 1946-04-26 (74 y.o. [name] Primary Care Viyan Rosamond: Cheryll Cockayne Other Clinician: Referring Bailey Faiella: Treating Marialuisa Basara/Extender: Rickard Patience in Treatment: 17 Active Inactive Nutrition Nursing Diagnoses: Impaired glucose control: actual or potential Potential for alteratiion in Nutrition/Potential for imbalanced nutrition Goals: Patient/caregiver verbalizes understanding of need to maintain therapeutic glucose control per primary care physician Date Initiated: 01/17/2020 Date Inactivated: 02/14/2020 Target Resolution Date: 02/14/2020 Goal Status: Met Patient/caregiver will maintain therapeutic glucose control Date Initiated: 01/17/2020 Target Resolution Date: 06/05/2020 Goal Status: Active Interventions: Assess HgA1c results as ordered upon admission and as needed Assess patient nutrition upon admission and as needed per policy Provide education on elevated blood sugars and impact on wound healing Treatment Activities: Patient referred to Primary Care Physician for further nutritional evaluation : 01/17/2020 Notes: Wound/Skin Impairment Nursing Diagnoses: Impaired tissue integrity Knowledge deficit related to ulceration/compromised skin integrity Goals: Patient/caregiver will verbalize understanding of skin care regimen Date Initiated: 01/17/2020 Target Resolution Date:  06/05/2020 Goal Status: Active Ulcer/skin breakdown will have a volume reduction of 30% by week 4 Date Initiated: 01/17/2020 Date Inactivated: 02/14/2020 Target Resolution Date: 02/14/2020 Goal Status: Met Ulcer/skin breakdown will have a volume reduction of 50% by week 8 Date Initiated: 02/14/2020 Date Inactivated: 03/13/2020 Target Resolution Date: 03/13/2020 Goal Status: Met Ulcer/skin breakdown will have a volume reduction of 80% by week 12 Date Initiated: 03/13/2020 Date Inactivated: 04/10/2020 Target Resolution Date: 04/10/2020 Goal Status: Unmet Unmet Reason: infection Interventions: Assess patient/caregiver ability  to obtain necessary supplies Assess patient/caregiver ability to perform ulcer/skin care regimen upon admission and as needed Assess ulceration(s) every visit Provide education on ulcer and skin care Treatment Activities: Skin care regimen initiated : 01/17/2020 Topical wound management initiated : 01/17/2020 Notes: Electronic Signature(s) Signed: 05/15/2020 6:14:29 PM By: [name] RN, BSN Entered By: [name] on 05/15/2020 12:05:06 -------------------------------------------------------------------------------- Pain Assessment Details Patient Name: Date of Service: [name] Amato RA E. 05/15/2020 10:45 A M Medical Record Number: [id number] Patient Account Number: [id number] Date of Birth/Sex: Treating RN: 03-15-1946 (74 y.o. [name] [name] Primary Care Brevin Mcfadden: [name] Other Clinician: Referring Gatsby Chismar: Treating Erryn Dickison/Extender: Adele Schilder in Treatment: 17 Active Problems Location of Pain Severity and Description of Pain Patient Has Paino No Site Locations Rate the pain. Current Pain Level: 0 Pain Management and Medication Current Pain Management: Medication: No Cold Application: No Rest: No Massage: No Activity: No T.[name]: No Heat Application: No Leg drop or elevation: No Is the Current Pain Management Adequate:  Adequate How does your wound impact your activities of daily livingo Sleep: No Bathing: No Appetite: No Relationship With Others: No Bladder Continence: No Emotions: No Bowel Continence: No Work: No Toileting: No Drive: No Dressing: No Hobbies: No Electronic Signature(s) Signed: 05/15/2020 5:54:43 PM By: Deon Pilling Entered By: Deon Pilling on 05/15/2020 11:44:54 -------------------------------------------------------------------------------- Patient/Caregiver Education Details Patient Name: Date of Service: [name] Amato RA E. 10/6/2021andnbsp10:45 A M Medical Record Number: [id number] Patient Account Number: [id number] Date of Birth/Gender: Treating RN: 1946-03-16 (74 y.o. [name] [name] Primary Care Physician: [name] Other Clinician: Referring Physician: Treating Physician/Extender: Adele Schilder in Treatment: 17 Education Assessment Education Provided To: Patient Education Topics Provided Elevated Blood Sugar/ Impact on Healing: Methods: Explain/Verbal Offloading: Methods: Explain/Verbal Responses: Reinforcements needed, State content correctly Wound/Skin Impairment: Methods: Explain/Verbal Responses: Reinforcements needed, State content correctly Electronic Signature(s) Signed: 05/15/2020 6:14:29 PM By: [name] RN, BSN Entered By: [name] on 05/15/2020 12:05:57 -------------------------------------------------------------------------------- Wound Assessment Details Patient Name: Date of Service: [name] Amato RA E. 05/15/2020 10:45 A M Medical Record Number: [id number] Patient Account Number: [id number] Date of Birth/Sex: Treating RN: 1945-11-06 (74 y.o. [name] [name], Tammi Klippel Primary Care Kristof Nadeem: [name] Other Clinician: Referring Aneisa Karren: Treating Jullian Previti/Extender: Landis Martins Weeks in Treatment: 17 Wound Status Wound Number: 1 Primary Diabetic Wound/Ulcer of the Lower  Extremity Etiology: Wound Location: Left, Medial, Plantar Foot Wound Open Wounding Event: Gradually Appeared Status: Date Acquired: 05/11/2019 Comorbid Sleep Apnea, Deep Vein Thrombosis, Hypertension, Colitis, Type Weeks Of Treatment: 17 History: II Diabetes, Gout, Osteoarthritis, Neuropathy Clustered Wound: No Wound Measurements Length: (cm) 0.5 Width: (cm) 0.8 Depth: (cm) 0.5 Area: (cm) 0.314 Volume: (cm) 0.157 % Reduction in Area: 69.2% % Reduction in Volume: 61.5% Epithelialization: Large (67-100%) Tunneling: No Undermining: No Wound Description Classification: Grade 2 Wound Margin: Well defined, not attached Exudate Amount: Small Exudate Type: Serous Exudate Color: amber Foul Odor After Cleansing: No Slough/Fibrino No Wound Bed Granulation Amount: Large (67-100%) Exposed Structure Granulation Quality: Pink Fascia Exposed: No Necrotic Amount: None Present (0%) Fat Layer (Subcutaneous Tissue) Exposed: Yes Tendon Exposed: No Muscle Exposed: No Joint Exposed: No Bone Exposed: No Electronic Signature(s) Signed: 05/15/2020 5:54:43 PM By: Deon Pilling Entered By: Deon Pilling on 05/15/2020 11:46:44 -------------------------------------------------------------------------------- Wound Assessment Details Patient Name: Date of Service: [name] [name], [name] E. 05/15/2020 10:45 A M Medical Record Number: [id number] Patient Account Number: [id number] Date of Birth/Sex: Treating RN: May 29, 1946 (74 y.o. F) Deaton,  Bobbi Primary Care Alfonza Toft: [name] Other Clinician: Referring Kailly Richoux: Treating Macky Galik/Extender: Landis Martins Weeks in Treatment: 17 Wound Status Wound Number: 2 Primary Etiology: Abrasion Wound Location: Left, Distal, Medial T Second oe Wound Status: Healed - Epithelialized Wounding Event: Shear/Friction Date Acquired: 05/01/2020 Weeks Of Treatment: 2 Clustered Wound: No Wound Measurements Length: (cm) Width: (cm) Depth:  (cm) Area: (cm) Volume: (cm) 0 % Reduction in Area: 100% 0 % Reduction in Volume: 100% 0 0 0 Wound Description Classification: Full Thickness Without Exposed Support Structu res Electronic Signature(s) Signed: 05/15/2020 5:54:43 PM By: Deon Pilling Entered By: Deon Pilling on 05/15/2020 11:45:52 -------------------------------------------------------------------------------- Wound Assessment Details Patient Name: Date of Service: [name] [name], [name] E. 05/15/2020 10:45 A M Medical Record Number: [id number] Patient Account Number: [id number] Date of Birth/Sex: Treating RN: 1946-06-07 (74 y.o. [name] [name], Meta.Reding Primary Care Graclynn Vanantwerp: [name] Other Clinician: Referring Reiley Keisler: Treating Daymen Hassebrock/Extender: Landis Martins Weeks in Treatment: 17 Wound Status Wound Number: 3 Primary Etiology: Diabetic Wound/Ulcer of the Lower Extremity Wound Location: Left T Great oe Wound Status: Healed - Epithelialized Wounding Event: Blister Date Acquired: 05/08/2020 Weeks Of Treatment: 1 Clustered Wound: No Wound Measurements Length: (cm) Width: (cm) Depth: (cm) Area: (cm) Volume: (cm) 0 % Reduction in Area: 100% 0 % Reduction in Volume: 100% 0 0 0 Wound Description Classification: Grade 2 Electronic Signature(s) Signed: 05/15/2020 5:54:43 PM By: Deon Pilling Entered By: Deon Pilling on 05/15/2020 11:45:53 -------------------------------------------------------------------------------- Wound Assessment Details Patient Name: Date of Service: [name] [name], [name] E. 05/15/2020 10:45 A M Medical Record Number: [id number] Patient Account Number: [id number] Date of Birth/Sex: Treating RN: 31-Jan-1946 (74 y.o. [name] [name], Meta.Reding Primary Care Selah Klang: [name] Other Clinician: Referring Jewelz Kobus: Treating Gwendola Hornaday/Extender: Landis Martins Weeks in Treatment: 17 Wound Status Wound Number: 4 Primary Etiology: Diabetic Wound/Ulcer of the Lower  Extremity Wound Location: Right T Great oe Wound Status: Healed - Epithelialized Wounding Event: Blister Date Acquired: 05/08/2020 Weeks Of Treatment: 1 Clustered Wound: No Wound Measurements Length: (cm) Width: (cm) Depth: (cm) Area: (cm) Volume: (cm) 0 % Reduction in Area: 100% 0 % Reduction in Volume: 100% 0 0 0 Wound Description Classification: Grade 2 Electronic Signature(s) Signed: 05/15/2020 5:54:43 PM By: Deon Pilling Entered By: Deon Pilling on 05/15/2020 11:45:53 -------------------------------------------------------------------------------- Wound Assessment Details Patient Name: Date of Service: [name] [name], [name] E. 05/15/2020 10:45 A M Medical Record Number: [id number] Patient Account Number: [id number] Date of Birth/Sex: Treating RN: 1945/09/18 (74 y.o. [name] [name], Tammi Klippel Primary Care Ayodele Hartsock: [name] Other Clinician: Referring Clifton Safley: Treating Chariti Havel/Extender: Landis Martins Weeks in Treatment: 17 Wound Status Wound Number: 5 Primary Diabetic Wound/Ulcer of the Lower Extremity Etiology: Wound Location: Right, Distal, Dorsal Foot Wound Open Wounding Event: Blister Status: Date Acquired: 05/08/2020 Comorbid Sleep Apnea, Deep Vein Thrombosis, Hypertension, Colitis, Type Weeks Of Treatment: 1 History: II Diabetes, Gout, Osteoarthritis, Neuropathy Clustered Wound: No Wound Measurements Length: (cm) 0.2 Width: (cm) 0.7 Depth: (cm) 0.1 Area: (cm) 0.11 Volume: (cm) 0.011 % Reduction in Area: 98.5% % Reduction in Volume: 98.5% Epithelialization: Large (67-100%) Tunneling: No Undermining: No Wound Description Classification: Grade 2 Wound Margin: Distinct, outline attached Exudate Amount: Small Exudate Type: Serous Exudate Color: amber Foul Odor After Cleansing: No Slough/Fibrino No Wound Bed Granulation Amount: Large (67-100%) Exposed Structure Granulation Quality: Pink Fascia Exposed: No Necrotic Amount: None Present  (0%) Fat Layer (Subcutaneous Tissue) Exposed: Yes Tendon Exposed: No Muscle Exposed: No Joint Exposed: No Bone Exposed: No Electronic  Signature(s) Signed: 05/15/2020 5:54:43 PM By: Deon Pilling Entered By: Deon Pilling on 05/15/2020 11:47:29 -------------------------------------------------------------------------------- Vitals Details Patient Name: Date of Service: [place], [place] RBA RA E. 05/15/2020 10:45 A M Medical Record Number: [id number] Patient Account Number: [id number] Date of Birth/Sex: Treating RN: 08/12/1945 (74 y.o. [name] [name], Tammi Klippel Primary Care Winfield Caba: [name] Other Clinician: Referring Artemis Koller: Treating Shakya Sebring/Extender: Adele Schilder in Treatment: 17 Vital Signs Time Taken: 11:26 Temperature (F): 99 Height (in): 66 Pulse (bpm): 95 Weight (lbs): 245 Respiratory Rate (breaths/min): 18 Body Mass Index (BMI): 39.5 Blood Pressure (mmHg): 141/70 Capillary Blood Glucose (mg/dl): 140 Reference Range: 80 - 120 mg / dl Electronic Signature(s) Signed: 05/15/2020 5:54:43 PM By: Deon Pilling Entered By: Deon Pilling on 05/15/2020 11:44:45

## 2020-05-15 NOTE — Progress Notes (Addendum)
[name], [name] ([id number]) Visit Report for 05/15/2020 Chief Complaint Document Details Patient Name: Date of Service: [name] [name], [name] E. 05/15/2020 10:45 A M Medical Record Number: [id number] Patient Account Number: [id number] Date of Birth/Sex: Treating RN: 11-04-1945 (74 y.o. [name] Other Clinician: Referring Provider: Treating Provider/Extender: Adele Schilder in Treatment: 17 Information Obtained from: Patient Chief Complaint Left foot ulcer Electronic Signature(s) Signed: 05/15/2020 11:18:57 AM By: Worthy Keeler PA-C Entered By: Worthy Keeler on 05/15/2020 11:18:57 -------------------------------------------------------------------------------- HPI Details Patient Name: Date of Service: [name] [name], [name] RA E. 05/15/2020 10:45 A M Medical Record Number: [id number] Patient Account Number: [id number] Date of Birth/Sex: Treating RN: February 08, 1946 (74 y.o. [name] Other Clinician: Referring Provider: Treating Provider/Extender: Adele Schilder in Treatment: 17 History of Present Illness HPI Description: 01/17/2020 upon evaluation today patient presents for initial evaluation here in our clinic concerning issues she has been having with a left medial/plantar foot ulcer. This is actually been an issue for her since October 2020. She has been seeing Dr. Doran Durand for quite some time during that course. Fortunately there is no signs of active infection at this time. Or least no mention of this to have seen in general. With that being said unfortunately I do see some signs of erythema noted today that does have me concerned about the possibility of infection at this point in the surrounding area of the wound. There is also a warm to touch at the site which is somewhat concerning. Fortunately there is no evidence of systemic infection which is great news. The patient  does have a history of diabetes mellitus type 2, Charcot foot which is what led to the wound, and hypertension. She notes that she was in a cast for some time with Dr. Doran Durand for about 8 weeks. During that time they were utilizing according to the patient silver nitrate along with a foam doughnut and then Coban to secure in place in the cast in place. With that being said I do not have the actual records to review we are going to try to get a hold of those unfortunately they would not flow over into care everywhere I did look today. She has been seeing Dr. Doran Durand and his physician assistant Larkin Ina up until the end of May and apparently is still seeing them on a regular basis every 2 weeks roughly. She has also tried Iodosorb without effect here. 01/24/2020 upon evaluation today patient actually appears to be doing quite well with regard to her wounds. She has been tolerating the dressing changes without complication. Fortunately there is no signs of active infection spreading which is good news. Her culture did show signs of Staph aureus I did place her on Augmentin due to the erythema surrounding the wound. With that being said the wound does appear to be doing better she has her longer walking cast/boot and I think that is actually good for her for the time being. I am considering reinitiating total contact cast when she gets back from vacation but next week she will actually be out of town at [employer] she knows not to get in the water but she still obviously is planning to enjoy herself she is going to take it easy on her foot however. 02/07/2020 upon evaluation today patient appears to be doing fairly well in regard to her ulcer on her foot. Fortunately there is no  signs of severe infection at this time which is great news and overall very pleased in that regard. With that being said I do think that she could still benefit from a total contact cast. Nonetheless she is using her walking boot which at  least provide some protection and that it prevents some of the friction occurring when she is ambulating. 02/14/2020 upon evaluation today patient appears to be doing well with regard to her foot ulcer. This is actually measuring a little bit smaller yet again this week. Overall very pleased with where things stand and I do not see any signs of active infection at this time which is also good news. Since she is measuring better the patient has wanting to somewhat hold off on proceeding with the total contact cast which I think is reasonable at this point. 02/28/2020 on evaluation today patient appears to be doing well in general in regard to her wound although she has a lot of callus buildup as compared to last time I saw her. This is can require sharp debridement today. I do believe she really needs the total contact cast as well which we have discussed previous. 7/23; patient comes in for a total contact cast change 03/06/2020 on evaluation today patient appears to be doing quite well with regard to her wounds. Fortunately the wound bed is measuring smaller and looking much better there is little callus noted although there is some debridement necessary today. 03/13/2020 on evaluation today patient's wound actually appears to be doing excellent which is great news. With that being said unfortunately she is having some issues currently with her left leg where she does have cellulitis it appears. This may have come from an area that rubbed underneath the cast from last week that we noted we padded that area and it looks to be doing excellent at this point but nonetheless the leg was somewhat painful, swollen, and somewhat erythematous. She also had an elevated white blood cell count of 11.5 based on what I saw on looking at her records from the med center in [facility] from where she was seen yesterday. Unfortunately with [place] having a provider on vacation there was no one here in the clinic in the afternoon  when she called therefore she went to the ER as advised. Subsequently they did not cut off the cast as they did not have anyone from orthopedics there to do so and subsequently also did not have the ability to do the Doppler for evaluation of DVT They recommended therefore given her dose of Eliquis as well as . Augmentin and sent her home to come see [place] today to have the cast taken off and then she is supposed to go back to have the study for DVT performed they are following. 03/20/2020 upon evaluation today patient appears to be doing well with regard to her foot all things considered we have not been able to use the total contact cast due to the infection that she had last week. She has been on the doxycycline and she had a 10-day supply of that I do believe that is helping and her leg appears to be doing better. With that being said there is fortunately no signs of active infection systemically at this time which is good news. No fevers, chills, nausea, vomiting, or diarrhea. 03/27/2020 upon evaluation today patient appears to be doing well with regard to her foot ulcer. There does not appear to be signs of active infection which is great news.  Overall I am very pleased with where things stand at this point. 04/03/2020 upon evaluation today patient appears to be doing pretty well in regard to the overall appearance of her wound. Fortunately there is no signs of active infection at this time which is great news. No fevers, chills, nausea, vomiting, or diarrhea. With that being said she does have some blue-green drainage that actually is a little bit concerning to me for the possibility of Pseudomonas. I discussed that with the patient today. With that being said I do believe that we may be able to manage this however with the topical antibiotic cream as opposed to having to do anything oral especially since she seems to be doing so well with overall appearance of the wound. 04/10/2020 on evaluation  today patient appears to be doing about the same roughly in regard to the overall size of her wound. With that being said she fortunately has not shown any signs of worsening overall which is good news. I do believe that she is doing a great job trying to offload but again she may still do better with the cast. I do not see in the blue-green drainage that we noticed previously I do believe the gentamicin help in this regard. 04/17/2020 on evaluation today patient's wound appears to be doing about the same at this point. There is no significant improvement at this point. No fever chills noted. She is up for put the cast back on the day. That she states in a couple weeks she will need to have this off to go to a workshop. 04/24/2020 on evaluation today patient appears to be doing significantly better in regard to her wound. Fortunately there is no signs of active infection and overall feel like she is making great progress the cast seems to have done excellent for her. 05/01/2020 upon evaluation today patient presents for reevaluation she really does not appear to be doing too badly in regard to the actual wound on the left foot we have been managing. Unfortunately she has bilateral lower extremity edema with blisters between the webspace of her first and second toe on both feet. She has a tremendous amount of edema in the legs which I think is where this is coming from it does not appear to be infected but nonetheless I do believe this is can be something that needs to be addressed today. Obviously this means we probably will not be putting the cast on at this point. She attributes this to the fact that she was sitting with her feet on the floor much longer during a conference last week she had a great time but unfortunately had a lot of complications as a result. 05/08/2020 upon evaluation today patient appears to be doing somewhat better in regard to her wounds at this time. Fortunately there is no signs of  active infection which is great news. With that being said I do believe that the blisters have ruptured and unfortunately did not just reattach I will remove some of the blistered tissue today. With that being said I do think the wound itself on the plantar aspect of left foot does need to have sharp debridement. 05/15/2020 upon evaluation today patient appears to be doing about the same in regard to her foot ulcer. Unfortunately in the past week her husband had a fall where he sustained a mild traumatic brain bleed. Fortunately he is doing better but being that he was in the hospital she had a walk on this a  lot more. The wound does not appear to be any better is also not really appearing to be significantly worse which is good news. There is no signs of active infection at this time. Electronic Signature(s) Signed: 05/15/2020 12:12:42 PM By: Worthy Keeler PA-C Entered By: Worthy Keeler on 05/15/2020 12:12:42 -------------------------------------------------------------------------------- Physical Exam Details Patient Name: Date of Service: [name] [name], [name] RA E. 05/15/2020 10:45 A M Medical Record Number: [id number] Patient Account Number: [id number] Date of Birth/Sex: Treating RN: 11/30/1945 (74 y.o. [name] Other Clinician: Referring Provider: Treating Provider/Extender: Landis Martins Weeks in Treatment: 55 Constitutional Well-nourished and well-hydrated in no acute distress. Respiratory normal breathing without difficulty. Psychiatric this patient is able to make decisions and demonstrates good insight into disease process. Alert and Oriented x 3. pleasant and cooperative. Notes Based on what I am seeing currently I feel like the patient is making some good progress with regard to all the wounds in general in fact everything on the left foot is healed I believe we can put the cast back on at this time and I discussed that  with her today. On the right she still has a small area open between the first and second toe and she also subsequently still has a small area open on the leg we will continue to wrap this using alginate. Electronic Signature(s) Signed: 05/15/2020 12:13:08 PM By: Worthy Keeler PA-C Entered By: Worthy Keeler on 05/15/2020 12:13:07 -------------------------------------------------------------------------------- Physician Orders Details Patient Name: Date of Service: [name] [name], [name] RA E. 05/15/2020 10:45 A M Medical Record Number: [id number] Patient Account Number: [id number] Date of Birth/Sex: Treating RN: 12-Aug-1945 (74 y.o. [name] Other Clinician: Referring Provider: Treating Provider/Extender: Adele Schilder in Treatment: [phone] Verbal / Phone Orders: No Diagnosis Coding ICD-10 Coding Code Description E11.621 Type 2 diabetes mellitus with foot ulcer L97.522 Non-pressure chronic ulcer of other part of left foot with fat layer exposed M14.672 Charcot's joint, left ankle and foot E11.40 Type 2 diabetes mellitus with diabetic neuropathy, unspecified I10 Essential (primary) hypertension Follow-up Appointments ppointment in 1 week. - Tues or Thurs Return A ppointment in 2 weeks. - Wed with Margarita Grizzle Return A Dressing Change Frequency Wound #1 Left,Medial,Plantar Foot Do not change entire dressing for one week. Wound #5 Right,Distal,Dorsal Foot Do not change entire dressing for one week. Skin Barriers/Peri-Wound Care Moisturizing lotion - to both legs Wound Cleansing Wound #1 Left,Medial,Plantar Foot May shower with protection. Wound #5 Right,Distal,Dorsal Foot May shower with protection. Primary Wound Dressing Wound #1 Left,Medial,Plantar Foot Silver Collagen - moisten with hydrogel Wound #5 Right,Distal,Dorsal Foot Calcium Alginate with Silver Secondary Dressing Wound #1 Left,Medial,Plantar Foot Foam - donut Dry  Gauze Drawtex - cut to fit inside wound to hold collagen in place Edema Control 3 Layer Compression System - Right Lower Extremity - unna boot at top to anchor Avoid standing for long periods of time Elevate legs to the level of the heart or above for 30 minutes daily and/or when sitting, a frequency of: Exercise regularly Off-Loading Total Contact Cast to Left Lower Extremity Electronic Signature(s) Signed: 05/15/2020 6:14:29 PM By: [name] RN, BSN Signed: 05/15/2020 6:38:49 PM By: Worthy Keeler PA-C Entered By: [name] on 05/15/2020 12:10:55 -------------------------------------------------------------------------------- Problem List Details Patient Name: Date of Service: [name] 05/15/2020 10:45 A M Medical Record Number: [id number] Patient Account Number: [id number] Date of Birth/Sex: Treating RN:  11-23-1945 (74 y.o. [name] Other Clinician: Referring Provider: Treating Provider/Extender: Adele Schilder in Treatment: 17 Active Problems ICD-10 Encounter Code Description Active Date MDM Diagnosis E11.621 Type 2 diabetes mellitus with foot ulcer 01/17/2020 No Yes L97.522 Non-pressure chronic ulcer of other part of left foot with fat layer exposed 01/17/2020 No Yes M14.672 Charcot's joint, left ankle and foot 01/17/2020 No Yes E11.40 Type 2 diabetes mellitus with diabetic neuropathy, unspecified 01/17/2020 No Yes I10 Essential (primary) hypertension 01/17/2020 No Yes Inactive Problems Resolved Problems Electronic Signature(s) Signed: 05/15/2020 11:18:51 AM By: Worthy Keeler PA-C Entered By: Worthy Keeler on 05/15/2020 11:18:50 -------------------------------------------------------------------------------- Progress Note Details Patient Name: Date of Service: [name] [name], [name] [place] RBA RA E. 05/15/2020 10:45 A M Medical Record Number: [id number] Patient Account Number: [id number] Date of Birth/Sex:  Treating RN: 1945/09/19 (74 y.o. [name]: Other Clinician: [name] Referring Provider: Treating Provider/Extender: Adele Schilder in Treatment: 17 Subjective Chief Complaint Information obtained from Patient Left foot ulcer History of Present Illness (HPI) 01/17/2020 upon evaluation today patient presents for initial evaluation here in our clinic concerning issues she has been having with a left medial/plantar foot ulcer. This is actually been an issue for her since October 2020. She has been seeing Dr. Doran Durand for quite some time during that course. Fortunately there is no signs of active infection at this time. Or least no mention of this to have seen in general. With that being said unfortunately I do see some signs of erythema noted today that does have me concerned about the possibility of infection at this point in the surrounding area of the wound. There is also a warm to touch at the site which is somewhat concerning. Fortunately there is no evidence of systemic infection which is great news. The patient does have a history of diabetes mellitus type 2, Charcot foot which is what led to the wound, and hypertension. She notes that she was in a cast for some time with Dr. Doran Durand for about 8 weeks. During that time they were utilizing according to the patient silver nitrate along with a foam doughnut and then Coban to secure in place in the cast in place. With that being said I do not have the actual records to review we are going to try to get a hold of those unfortunately they would not flow over into care everywhere I did look today. She has been seeing Dr. Doran Durand and his physician assistant Larkin Ina up until the end of May and apparently is still seeing them on a regular basis every 2 weeks roughly. She has also tried Iodosorb without effect here. 01/24/2020 upon evaluation today patient actually appears to be doing quite well with  regard to her wounds. She has been tolerating the dressing changes without complication. Fortunately there is no signs of active infection spreading which is good news. Her culture did show signs of Staph aureus I did place her on Augmentin due to the erythema surrounding the wound. With that being said the wound does appear to be doing better she has her longer walking cast/boot and I think that is actually good for her for the time being. I am considering reinitiating total contact cast when she gets back from vacation but next week she will actually be out of town at [employer] she knows not to get in the water but she still obviously is planning to enjoy herself  she is going to take it easy on her foot however. 02/07/2020 upon evaluation today patient appears to be doing fairly well in regard to her ulcer on her foot. Fortunately there is no signs of severe infection at this time which is great news and overall very pleased in that regard. With that being said I do think that she could still benefit from a total contact cast. Nonetheless she is using her walking boot which at least provide some protection and that it prevents some of the friction occurring when she is ambulating. 02/14/2020 upon evaluation today patient appears to be doing well with regard to her foot ulcer. This is actually measuring a little bit smaller yet again this week. Overall very pleased with where things stand and I do not see any signs of active infection at this time which is also good news. Since she is measuring better the patient has wanting to somewhat hold off on proceeding with the total contact cast which I think is reasonable at this point. 02/28/2020 on evaluation today patient appears to be doing well in general in regard to her wound although she has a lot of callus buildup as compared to last time I saw her. This is can require sharp debridement today. I do believe she really needs the total contact cast as well  which we have discussed previous. 7/23; patient comes in for a total contact cast change 03/06/2020 on evaluation today patient appears to be doing quite well with regard to her wounds. Fortunately the wound bed is measuring smaller and looking much better there is little callus noted although there is some debridement necessary today. 03/13/2020 on evaluation today patient's wound actually appears to be doing excellent which is great news. With that being said unfortunately she is having some issues currently with her left leg where she does have cellulitis it appears. This may have come from an area that rubbed underneath the cast from last week that we noted we padded that area and it looks to be doing excellent at this point but nonetheless the leg was somewhat painful, swollen, and somewhat erythematous. She also had an elevated white blood cell count of 11.5 based on what I saw on looking at her records from the med center in [facility] from where she was seen yesterday. Unfortunately with [place] having a provider on vacation there was no one here in the clinic in the afternoon when she called therefore she went to the ER as advised. Subsequently they did not cut off the cast as they did not have anyone from orthopedics there to do so and subsequently also did not have the ability to do the Doppler for evaluation of DVT They recommended therefore given her dose of Eliquis as well as . Augmentin and sent her home to come see [place] today to have the cast taken off and then she is supposed to go back to have the study for DVT performed they are following. 03/20/2020 upon evaluation today patient appears to be doing well with regard to her foot all things considered we have not been able to use the total contact cast due to the infection that she had last week. She has been on the doxycycline and she had a 10-day supply of that I do believe that is helping and her leg appears to be doing better. With that  being said there is fortunately no signs of active infection systemically at this time which is good news. No fevers, chills,  nausea, vomiting, or diarrhea. 03/27/2020 upon evaluation today patient appears to be doing well with regard to her foot ulcer. There does not appear to be signs of active infection which is great news. Overall I am very pleased with where things stand at this point. 04/03/2020 upon evaluation today patient appears to be doing pretty well in regard to the overall appearance of her wound. Fortunately there is no signs of active infection at this time which is great news. No fevers, chills, nausea, vomiting, or diarrhea. With that being said she does have some blue-green drainage that actually is a little bit concerning to me for the possibility of Pseudomonas. I discussed that with the patient today. With that being said I do believe that we may be able to manage this however with the topical antibiotic cream as opposed to having to do anything oral especially since she seems to be doing so well with overall appearance of the wound. 04/10/2020 on evaluation today patient appears to be doing about the same roughly in regard to the overall size of her wound. With that being said she fortunately has not shown any signs of worsening overall which is good news. I do believe that she is doing a great job trying to offload but again she may still do better with the cast. I do not see in the blue-green drainage that we noticed previously I do believe the gentamicin help in this regard. 04/17/2020 on evaluation today patient's wound appears to be doing about the same at this point. There is no significant improvement at this point. No fever chills noted. She is up for put the cast back on the day. That she states in a couple weeks she will need to have this off to go to a workshop. 04/24/2020 on evaluation today patient appears to be doing significantly better in regard to her wound. Fortunately  there is no signs of active infection and overall feel like she is making great progress the cast seems to have done excellent for her. 05/01/2020 upon evaluation today patient presents for reevaluation she really does not appear to be doing too badly in regard to the actual wound on the left foot we have been managing. Unfortunately she has bilateral lower extremity edema with blisters between the webspace of her first and second toe on both feet. She has a tremendous amount of edema in the legs which I think is where this is coming from it does not appear to be infected but nonetheless I do believe this is can be something that needs to be addressed today. Obviously this means we probably will not be putting the cast on at this point. She attributes this to the fact that she was sitting with her feet on the floor much longer during a conference last week she had a great time but unfortunately had a lot of complications as a result. 05/08/2020 upon evaluation today patient appears to be doing somewhat better in regard to her wounds at this time. Fortunately there is no signs of active infection which is great news. With that being said I do believe that the blisters have ruptured and unfortunately did not just reattach I will remove some of the blistered tissue today. With that being said I do think the wound itself on the plantar aspect of left foot does need to have sharp debridement. 05/15/2020 upon evaluation today patient appears to be doing about the same in regard to her foot ulcer. Unfortunately in the past week  her husband had a fall where he sustained a mild traumatic brain bleed. Fortunately he is doing better but being that he was in the hospital she had a walk on this a lot more. The wound does not appear to be any better is also not really appearing to be significantly worse which is good news. There is no signs of active infection at this time. Objective Constitutional Well-nourished  and well-hydrated in no acute distress. Vitals Time Taken: 11:26 AM, Height: 66 in, Weight: 245 lbs, BMI: 39.5, Temperature: 99 F, Pulse: 95 bpm, Respiratory Rate: 18 breaths/min, Blood Pressure: 141/70 mmHg, Capillary Blood Glucose: 140 mg/dl. Respiratory normal breathing without difficulty. Psychiatric this patient is able to make decisions and demonstrates good insight into disease process. Alert and Oriented x 3. pleasant and cooperative. General Notes: Based on what I am seeing currently I feel like the patient is making some good progress with regard to all the wounds in general in fact everything on the left foot is healed I believe we can put the cast back on at this time and I discussed that with her today. On the right she still has a small area open between the first and second toe and she also subsequently still has a small area open on the leg we will continue to wrap this using alginate. Integumentary (Hair, Skin) Wound #1 status is Open. Original cause of wound was Gradually Appeared. The wound is located on the [place]. The wound measures 0.5cm length x 0.8cm width x 0.5cm depth; 0.314cm^2 area and 0.157cm^3 volume. There is Fat Layer (Subcutaneous Tissue) exposed. There is no tunneling or undermining noted. There is a small amount of serous drainage noted. The wound margin is well defined and not attached to the wound base. There is large (67- 100%) pink granulation within the wound bed. There is no necrotic tissue within the wound bed. Wound #2 status is Healed - Epithelialized. Original cause of wound was Shear/Friction. The wound is located on the Left,Distal,Medial T Second. The wound oe measures 0cm length x 0cm width x 0cm depth; 0cm^2 area and 0cm^3 volume. Wound #3 status is Healed - Epithelialized. Original cause of wound was Blister. The wound is located on the Left T Great. The wound measures 0cm length x oe 0cm width x 0cm depth; 0cm^2 area and  0cm^3 volume. Wound #4 status is Healed - Epithelialized. Original cause of wound was Blister. The wound is located on the Right T Great. The wound measures 0cm length oe x 0cm width x 0cm depth; 0cm^2 area and 0cm^3 volume. Wound #5 status is Open. Original cause of wound was Blister. The wound is located on the Right,Distal,Dorsal Foot. The wound measures 0.2cm length x 0.7cm width x 0.1cm depth; 0.11cm^2 area and 0.011cm^3 volume. There is Fat Layer (Subcutaneous Tissue) exposed. There is no tunneling or undermining noted. There is a small amount of serous drainage noted. The wound margin is distinct with the outline attached to the wound base. There is large (67-100%) pink granulation within the wound bed. There is no necrotic tissue within the wound bed. Assessment Active Problems ICD-10 Type 2 diabetes mellitus with foot ulcer Non-pressure chronic ulcer of other part of left foot with fat layer exposed Charcot's joint, left ankle and foot Type 2 diabetes mellitus with diabetic neuropathy, unspecified Essential (primary) hypertension Procedures Wound #1 Pre-procedure diagnosis of Wound #1 is a Diabetic Wound/Ulcer of the Lower Extremity located on the Left,Medial,Plantar Foot . There was a T Contact  otal Cast Procedure by Worthy Keeler, PA. Post procedure Diagnosis Wound #1: Same as Pre-Procedure Plan Follow-up Appointments: Return Appointment in 1 week. - Tues or Thurs Return Appointment in 2 weeks. - Wed with Margarita Grizzle Dressing Change Frequency: Wound #1 Left,Medial,Plantar Foot: Do not change entire dressing for one week. Wound #5 Right,Distal,Dorsal Foot: Do not change entire dressing for one week. Skin Barriers/Peri-Wound Care: Moisturizing lotion - to both legs Wound Cleansing: Wound #1 Left,Medial,Plantar Foot: May shower with protection. Wound #5 Right,Distal,Dorsal Foot: May shower with protection. Primary Wound Dressing: Wound #1 Left,Medial,Plantar Foot: Silver  Collagen - moisten with hydrogel Wound #5 Right,Distal,Dorsal Foot: Calcium Alginate with Silver Secondary Dressing: Wound #1 Left,Medial,Plantar Foot: Foam - donut Dry Gauze Drawtex - cut to fit inside wound to hold collagen in place Edema Control: 3 Layer Compression System - Right Lower Extremity - unna boot at top to anchor Avoid standing for long periods of time Elevate legs to the level of the heart or above for 30 minutes daily and/or when sitting, a frequency of: Exercise regularly Off-Loading: T Contact Cast to Left Lower Extremity otal 1. I would recommend currently that we go ahead and continue with the wound care measures as before with the collagen to the left plantar foot we will also use the total contact cast for offloading today. 2. I am also can recommend at this time that we have the patient continue with alginate to the right leg and foot areas which are still open and a 3 layer compression wrap on the right leg. We will see patient back for reevaluation in 1 week here in the clinic. If anything worsens or changes patient will contact our office for additional recommendations. Electronic Signature(s) Signed: 05/15/2020 12:13:55 PM By: Worthy Keeler PA-C Entered By: Worthy Keeler on 05/15/2020 12:13:55 -------------------------------------------------------------------------------- Total Contact Cast Details Patient Name: Date of Service: [name] [name], [name] E. 05/15/2020 10:45 A M Medical Record Number: [id number] Patient Account Number: [id number] Date of Birth/Sex: Treating RN: 1946/06/06 (74 y.o. [name] Other Clinician: Referring Provider: Treating Provider/Extender: Adele Schilder in Treatment: [phone] T Contact Cast Applied for Wound Assessment: otal Wound #1 Left,Medial,Plantar Foot Performed By: Physician Worthy Keeler, PA Post Procedure Diagnosis Same as Pre-procedure Electronic  Signature(s) Signed: 05/15/2020 6:14:29 PM By: [name] RN, BSN Signed: 05/15/2020 6:38:49 PM By: Worthy Keeler PA-C Entered By: [name] on 05/15/2020 12:08:19 -------------------------------------------------------------------------------- SuperBill Details Patient Name: Date of Service: [name] 05/15/2020 Medical Record Number: [id number] Patient Account Number: [id number] Date of Birth/Sex: Treating RN: 1946-01-18 (74 y.o. [name], [name] Primary Care Provider: [name] Other Clinician: Referring Provider: Treating Provider/Extender: Adele Schilder in Treatment: 17 Diagnosis Coding ICD-10 Codes Code Description E11.621 Type 2 diabetes mellitus with foot ulcer L97.522 Non-pressure chronic ulcer of other part of left foot with fat layer exposed M14.672 Charcot's joint, left ankle and foot E11.40 Type 2 diabetes mellitus with diabetic neuropathy, unspecified I10 Essential (primary) hypertension Facility Procedures CPT4 Code: 27035009 Description: [id number] - APPLY TOTAL CONTACT LEG CAST ICD-10 Diagnosis Description L97.522 Non-pressure chronic ulcer of other part of left foot with fat layer exposed Modifier: Quantity: 1 CPT4 Code: 99371696 Description: (Facility Use Only) [id number] - APPLY MULTLAY COMPRS LWR RT LEG Modifier: 59 Quantity: 1 Physician Procedures : CPT4 Code Description Modifier [id number] - WC PHYS APPLY TOTAL CONTACT CAST ICD-10 Diagnosis Description L97.522 Non-pressure chronic ulcer of  other part of left foot with fat layer exposed Quantity: 1 Electronic Signature(s) Signed: 05/15/2020 12:14:05 PM By: Worthy Keeler PA-C Entered By: Worthy Keeler on 05/15/2020 12:14:04

## 2020-05-16 DIAGNOSIS — L4059 Other psoriatic arthropathy: Secondary | ICD-10-CM | POA: Diagnosis not present

## 2020-05-17 ENCOUNTER — Telehealth: Payer: Self-pay | Admitting: Cardiovascular Disease

## 2020-05-17 NOTE — Telephone Encounter (Signed)
Called left message per dpr .    Appointment can be changed to virtual but will need to be change to another day .  Reschedule appointment to 10/ 15 /21 at 8:20 am with Dr Claiborne Billings .  if there is any  Question may call back

## 2020-05-17 NOTE — Telephone Encounter (Signed)
Patient states she has her leg in a cast and would like to know if her appointment 05/21/2020 can be virtual.

## 2020-05-20 DIAGNOSIS — Z23 Encounter for immunization: Secondary | ICD-10-CM

## 2020-05-21 ENCOUNTER — Other Ambulatory Visit (INDEPENDENT_AMBULATORY_CARE_PROVIDER_SITE_OTHER): Payer: Medicare Other

## 2020-05-21 ENCOUNTER — Ambulatory Visit: Payer: Medicare Other | Admitting: Cardiovascular Disease

## 2020-05-21 DIAGNOSIS — Z23 Encounter for immunization: Secondary | ICD-10-CM

## 2020-05-21 NOTE — Progress Notes (Signed)
fflu

## 2020-05-23 ENCOUNTER — Encounter (HOSPITAL_BASED_OUTPATIENT_CLINIC_OR_DEPARTMENT_OTHER): Payer: Medicare Other | Admitting: Internal Medicine

## 2020-05-23 DIAGNOSIS — E1140 Type 2 diabetes mellitus with diabetic neuropathy, unspecified: Secondary | ICD-10-CM | POA: Diagnosis not present

## 2020-05-23 DIAGNOSIS — E11621 Type 2 diabetes mellitus with foot ulcer: Secondary | ICD-10-CM | POA: Diagnosis not present

## 2020-05-23 DIAGNOSIS — L97522 Non-pressure chronic ulcer of other part of left foot with fat layer exposed: Secondary | ICD-10-CM | POA: Diagnosis not present

## 2020-05-23 DIAGNOSIS — E11610 Type 2 diabetes mellitus with diabetic neuropathic arthropathy: Secondary | ICD-10-CM | POA: Diagnosis not present

## 2020-05-23 NOTE — Progress Notes (Signed)
JETT, KULZER ([id number]) Visit Report for 05/23/2020 Arrival Information Details Patient Name: Date of Service: [name] [name], [name] E. 05/23/2020 12:30 PM Medical Record Number: [id number] Patient Account Number: [id number] Date of Birth/Sex: Treating RN: June 03, 1946 (74 y.o. [name] Primary Care Rossanna Spitzley: [name] Other Clinician: Referring Anyelin Mogle: Treating Dwyne Hasegawa/Extender: Genella Rife in Treatment: 18 Visit Information History Since Last Visit Added or deleted any medications: No Patient Arrived: Wheel Chair Any new allergies or adverse reactions: No Arrival Time: 12:50 Had a fall or experienced change in No Accompanied By: self activities of daily living that may affect Transfer Assistance: None risk of falls: Patient Identification Verified: Yes Signs or symptoms of abuse/neglect since last visito No Secondary Verification Process Completed: Yes Hospitalized since last visit: No Patient Requires Transmission-Based Precautions: No Implantable device outside of the clinic excluding No Patient Has Alerts: No cellular tissue based products placed in the center since last visit: Has Dressing in Place as Prescribed: Yes Has Compression in Place as Prescribed: Yes Has Footwear/Offloading in Place as Prescribed: Yes Right: T Contact Cast otal Pain Present Now: No Electronic Signature(s) Signed: 05/23/2020 5:51:00 PM By: Deon Pilling Entered By: Deon Pilling on 05/23/2020 13:02:24 -------------------------------------------------------------------------------- Encounter Discharge Information Details Patient Name: Date of Service: [name] Amato RA E. 05/23/2020 12:30 PM Medical Record Number: [id number] Patient Account Number: [id number] Date of Birth/Sex: Treating RN: 1945/12/09 (74 y.o. [name] Primary Care Joia Doyle: [name] Other Clinician: Referring Heyden Jaber: Treating Jaedin Regina/Extender: Genella Rife in Treatment: 18 Encounter Discharge Information Items Discharge Condition: Stable Ambulatory Status: Wheelchair Discharge Destination: Home Transportation: Private Auto Accompanied By: self Schedule Follow-up Appointment: Yes Clinical Summary of Care: Electronic Signature(s) Signed: 05/23/2020 5:51:00 PM By: Deon Pilling Entered By: Deon Pilling on 05/23/2020 14:05:18 -------------------------------------------------------------------------------- Lower Extremity Assessment Details Patient Name: Date of Service: [name] [name], [name] E. 05/23/2020 12:30 PM Medical Record Number: [id number] Patient Account Number: [id number] Date of Birth/Sex: Treating RN: 09-04-1945 (74 y.o. [name] Primary Care Miral Hoopes: [name] Other Clinician: Referring Houston Surges: Treating Jacy Brocker/Extender: Genella Rife in Treatment: 18 Edema Assessment Assessed: Shirlyn Goltz: Yes] Patrice Paradise: Yes] Edema: [Left: Ye] [Right: s] Calf Left: Right: Point of Measurement: 35 cm From Medial Instep 34 cm 38 cm Ankle Left: Right: Point of Measurement: 9 cm From Medial Instep 22.5 cm 23.5 cm Vascular Assessment Pulses: Dorsalis Pedis Palpable: [Left:Yes] [Right:Yes] Electronic Signature(s) Signed: 05/23/2020 5:51:00 PM By: Deon Pilling Entered By: Deon Pilling on 05/23/2020 13:03:22 -------------------------------------------------------------------------------- Multi Wound Chart Details Patient Name: Date of Service: [name] Amato RA E. 05/23/2020 12:30 PM Medical Record Number: [id number] Patient Account Number: [id number] Date of Birth/Sex: Treating RN: 01/02/1946 (74 y.o. [name] Primary Care Kaysha Parsell: [name] Other Clinician: Referring Arcadia Gorgas: Treating Sharlet Notaro/Extender: Genella Rife in Treatment: 18 Vital Signs Height(in): 66 Capillary Blood Glucose(mg/dl): 160 Weight(lbs): 245 Pulse(bpm): 112 Body Mass Index(BMI):  40 Blood Pressure(mmHg): 168/74 Temperature(F): 98.7 Respiratory Rate(breaths/min): 18 Photos: [1:No Photos Left, Medial, Plantar Foot] [5:No Photos Right, Distal, Dorsal Foot] [N/A:N/A N/A] Wound Location: [1:Gradually Appeared] [5:Blister] [N/A:N/A] Wounding Event: [1:Diabetic Wound/Ulcer of the Lower] [5:Diabetic Wound/Ulcer of the Lower] [N/A:N/A] Primary Etiology: [1:Extremity Sleep Apnea, Deep Vein Thrombosis,] [5:Extremity N/A] [N/A:N/A] Comorbid History: [1:Hypertension, Colitis, Type II Diabetes, Gout, Osteoarthritis, Neuropathy 05/11/2019] [5:05/08/2020] [N/A:N/A] Date Acquired: [1:18] [5:2] [N/A:N/A] Weeks of Treatment: [1:Open] [5:Healed - Epithelialized] [N/A:N/A] Wound Status: [1:0.4x0.4x0.3] [5:0x0x0] [N/A:N/A] Measurements L x W x D (cm) [1:0.126] [5:0] [N/A:N/A] A (cm) :  rea [1:0.038] [5:0] [N/A:N/A] Volume (cm) : [1:87.70%] [5:100.00%] [N/A:N/A] % Reduction in A rea: [1:90.70%] [5:100.00%] [N/A:N/A] % Reduction in Volume: [1:Grade 2] [5:Grade 2] [N/A:N/A] Classification: [1:Small] [5:N/A] [N/A:N/A] Exudate A mount: [1:Serous] [5:N/A] [N/A:N/A] Exudate Type: [1:amber] [5:N/A] [N/A:N/A] Exudate Color: [1:Well defined, not attached] [5:N/A] [N/A:N/A] Wound Margin: [1:Large (67-100%)] [5:N/A] [N/A:N/A] Granulation A mount: [1:Pink] [5:N/A] [N/A:N/A] Granulation Quality: [1:None Present (0%)] [5:N/A] [N/A:N/A] Necrotic A mount: [1:Fat Layer (Subcutaneous Tissue): Yes N/A] [N/A:N/A] Exposed Structures: [1:Fascia: No Tendon: No Muscle: No Joint: No Bone: No Large (67-100%)] [5:N/A] [N/A:N/A] Epithelialization: [1:callus to periwound.] [5:N/A] [N/A:N/A] Assessment Notes: [1:T Contact Cast otal] [5:N/A] [N/A:N/A] Treatment Notes Electronic Signature(s) Signed: 05/23/2020 5:33:07 PM By: Linton Ham MD Signed: 05/23/2020 5:51:00 PM By: Deon Pilling Entered By: Linton Ham on 05/23/2020  13:21:36 -------------------------------------------------------------------------------- Multi-Disciplinary Care Plan Details Patient Name: Date of Service: [name] Guarneri RBA RA E. 05/23/2020 12:30 PM Medical Record Number: [id number] Patient Account Number: [id number] Date of Birth/Sex: Treating RN: September 27, 1945 (74 y.o. [name] Primary Care Naveyah Iacovelli: [name] Other Clinician: Referring Chesley Valls: Treating Kaelem Brach/Extender: Genella Rife in Treatment: 18 Active Inactive Nutrition Nursing Diagnoses: Impaired glucose control: actual or potential Potential for alteratiion in Nutrition/Potential for imbalanced nutrition Goals: Patient/caregiver verbalizes understanding of need to maintain therapeutic glucose control per primary care physician Date Initiated: 01/17/2020 Date Inactivated: 02/14/2020 Target Resolution Date: 02/14/2020 Goal Status: Met Patient/caregiver will maintain therapeutic glucose control Date Initiated: 01/17/2020 Target Resolution Date: 06/05/2020 Goal Status: Active Interventions: Assess HgA1c results as ordered upon admission and as needed Assess patient nutrition upon admission and as needed per policy Provide education on elevated blood sugars and impact on wound healing Treatment Activities: Patient referred to Primary Care Physician for further nutritional evaluation : 01/17/2020 Notes: Wound/Skin Impairment Nursing Diagnoses: Impaired tissue integrity Knowledge deficit related to ulceration/compromised skin integrity Goals: Patient/caregiver will verbalize understanding of skin care regimen Date Initiated: 01/17/2020 Target Resolution Date: 06/05/2020 Goal Status: Active Ulcer/skin breakdown will have a volume reduction of 30% by week 4 Date Initiated: 01/17/2020 Date Inactivated: 02/14/2020 Target Resolution Date: 02/14/2020 Goal Status: Met Ulcer/skin breakdown will have a volume reduction of 50% by week 8 Date Initiated:  02/14/2020 Date Inactivated: 03/13/2020 Target Resolution Date: 03/13/2020 Goal Status: Met Ulcer/skin breakdown will have a volume reduction of 80% by week 12 Date Initiated: 03/13/2020 Date Inactivated: 04/10/2020 Target Resolution Date: 04/10/2020 Goal Status: Unmet Unmet Reason: infection Interventions: Assess patient/caregiver ability to obtain necessary supplies Assess patient/caregiver ability to perform ulcer/skin care regimen upon admission and as needed Assess ulceration(s) every visit Provide education on ulcer and skin care Treatment Activities: Skin care regimen initiated : 01/17/2020 Topical wound management initiated : 01/17/2020 Notes: Electronic Signature(s) Signed: 05/23/2020 5:51:00 PM By: Deon Pilling Entered By: Deon Pilling on 05/23/2020 13:13:35 -------------------------------------------------------------------------------- Pain Assessment Details Patient Name: Date of Service: [name] [name], [name] E. 05/23/2020 12:30 PM Medical Record Number: [id number] Patient Account Number: [id number] Date of Birth/Sex: Treating RN: 1945/11/12 (74 y.o. [name] Primary Care [employer] [facility]: [name] Other Clinician: Referring Eian Vandervelden: Treating Brittain Hosie/Extender: Genella Rife in Treatment: 18 Active Problems Location of Pain Severity and Description of Pain Patient Has Paino No Site Locations Rate the pain. Current Pain Level: 0 Pain Management and Medication Current Pain Management: Medication: No Cold Application: No Rest: No Massage: No Activity: No T.[name]: No Heat Application: No Leg drop or elevation: No Is the Current Pain Management Adequate: Adequate How does your wound impact your activities of daily livingo  Sleep: No Bathing: No Appetite: No Relationship With Others: No Bladder Continence: No Emotions: No Bowel Continence: No Work: No Toileting: No Drive: No Dressing: No Hobbies: No Electronic Signature(s) Signed:  05/23/2020 5:51:00 PM By: Shawn Stall Entered By: Shawn Stall on 05/23/2020 13:02:58 -------------------------------------------------------------------------------- Patient/Caregiver Education Details Patient Name: Date of Service: [name] [name] 10/14/2021andnbsp12:30 PM Medical Record Number: [id number] Patient Account Number: [id number] Date of Birth/Gender: Treating RN: Aug 20, 1945 (74 y.o. Arta Silence Primary Care Physician: Cheryll Cockayne Other Clinician: Referring Physician: Treating Physician/Extender: Avie Echevaria in Treatment: 18 Education Assessment Education Provided To: Patient Education Topics Provided Elevated Blood Sugar/ Impact on Healing: Handouts: Elevated Blood Sugars: How Do They Affect Wound Healing Methods: Explain/Verbal Responses: Reinforcements needed Electronic Signature(s) Signed: 05/23/2020 5:51:00 PM By: Shawn Stall Entered By: Shawn Stall on 05/23/2020 13:13:58 -------------------------------------------------------------------------------- Wound Assessment Details Patient Name: Date of Service: [name] [name], [name] E. 05/23/2020 12:30 PM Medical Record Number: [id number] Patient Account Number: [id number] Date of Birth/Sex: Treating RN: 1945/08/25 (74 y.o. Debara Pickett, Yvonne Kendall Primary Care Casyn Becvar: Cheryll Cockayne Other Clinician: Referring Jamarii Banks: Treating Reizel Calzada/Extender: Avie Echevaria in Treatment: 18 Wound Status Wound Number: 1 Primary Diabetic Wound/Ulcer of the Lower Extremity Etiology: Wound Location: Left, Medial, Plantar Foot Wound Open Wounding Event: Gradually Appeared Status: Date Acquired: 05/11/2019 Comorbid Sleep Apnea, Deep Vein Thrombosis, Hypertension, Colitis, Type Weeks Of Treatment: 18 History: II Diabetes, Gout, Osteoarthritis, Neuropathy Clustered Wound: No Wound Measurements Length: (cm) 0.4 Width: (cm) 0.4 Depth: (cm) 0.3 Area: (cm) 0.126 Volume: (cm)  0.038 % Reduction in Area: 87.7% % Reduction in Volume: 90.7% Epithelialization: Large (67-100%) Tunneling: No Wound Description Classification: Grade 2 Wound Margin: Well defined, not attached Exudate Amount: Small Exudate Type: Serous Exudate Color: amber Foul Odor After Cleansing: No Slough/Fibrino No Wound Bed Granulation Amount: Large (67-100%) Exposed Structure Granulation Quality: Pink Fascia Exposed: No Necrotic Amount: None Present (0%) Fat Layer (Subcutaneous Tissue) Exposed: Yes Tendon Exposed: No Muscle Exposed: No Joint Exposed: No Bone Exposed: No Assessment Notes callus to periwound. Treatment Notes Wound #1 (Left, Medial, Plantar Foot) 1. Cleanse With Wound Cleanser Soap and water 3. Primary Dressing Applied Collegen AG Hydrogel or K-Y Jelly 4. Secondary Dressing Dry Gauze Foam 5. Secured With Medipore tape 7. Footwear/Offloading device applied T Contact Cast otal Notes Last layer of TCC applied by MD. Electronic Signature(s) Signed: 05/23/2020 5:51:00 PM By: Shawn Stall Entered By: Shawn Stall on 05/23/2020 13:03:56 -------------------------------------------------------------------------------- Wound Assessment Details Patient Name: Date of Service: [name] Prairie RA E. 05/23/2020 12:30 PM Medical Record Number: [id number] Patient Account Number: [id number] Date of Birth/Sex: Treating RN: 15-Dec-1945 (74 y.o. Debara Pickett, Yvonne Kendall Primary Care Diannah Rindfleisch: Cheryll Cockayne Other Clinician: Referring Roxsana Riding: Treating Ariya Bohannon/Extender: Avie Echevaria in Treatment: 18 Wound Status Wound Number: 5 Primary Etiology: Diabetic Wound/Ulcer of the Lower Extremity Wound Location: Right, Distal, Dorsal Foot Wound Status: Healed - Epithelialized Wounding Event: Blister Date Acquired: 05/08/2020 Weeks Of Treatment: 2 Clustered Wound: No Wound Measurements Length: (cm) Width: (cm) Depth: (cm) Area: (cm) Volume: (cm) 0 % Reduction  in Area: 100% 0 % Reduction in Volume: 100% 0 0 0 Wound Description Classification: Grade 2 Electronic Signature(s) Signed: 05/23/2020 5:51:00 PM By: Shawn Stall Entered By: Shawn Stall on 05/23/2020 13:05:34 -------------------------------------------------------------------------------- Vitals Details Patient Name: Date of Service: [name] Prairie RA E. 05/23/2020 12:30 PM Medical Record Number: [id number] Patient Account Number: [id number] Date of Birth/Sex: Treating RN: 1946/06/11 (74 y.o. Arta Silence Primary Care  Esra Frankowski: [name] Other Clinician: Referring Nihira Puello: Treating Sonal Dorwart/Extender: Genella Rife in Treatment: 18 Vital Signs Time Taken: 12:55 Temperature (F): 98.7 Height (in): 66 Pulse (bpm): 112 Weight (lbs): 245 Respiratory Rate (breaths/min): 18 Body Mass Index (BMI): 39.5 Blood Pressure (mmHg): 168/74 Capillary Blood Glucose (mg/dl): 160 Reference Range: 80 - 120 mg / dl Electronic Signature(s) Signed: 05/23/2020 5:51:00 PM By: Deon Pilling Entered By: Deon Pilling on 05/23/2020 13:02:47

## 2020-05-23 NOTE — Progress Notes (Signed)
CHANTALLE, DEFILIPPO ([id number]) Visit Report for 05/23/2020 HPI Details Patient Name: Date of Service: OTTIS, SARNOWSKI RA E. 05/23/2020 12:30 PM Medical Record Number: [id number] Patient Account Number: [id number] Date of Birth/Sex: Treating RN: [birth year] (74 y.o. [name] Primary Care Provider: [name] Other Clinician: Referring Provider: Treating Provider/Extender: Genella Rife in Treatment: 18 History of Present Illness HPI Description: 01/17/2020 upon evaluation today patient presents for initial evaluation here in our clinic concerning issues she has been having with a left medial/plantar foot ulcer. This is actually been an issue for her since October 2020. She has been seeing Dr. Doran Durand for quite some time during that course. Fortunately there is no signs of active infection at this time. Or least no mention of this to have seen in general. With that being said unfortunately I do see some signs of erythema noted today that does have me concerned about the possibility of infection at this point in the surrounding area of the wound. There is also a warm to touch at the site which is somewhat concerning. Fortunately there is no evidence of systemic infection which is great news. The patient does have a history of diabetes mellitus type 2, Charcot foot which is what led to the wound, and hypertension. She notes that she was in a cast for some time with Dr. Doran Durand for about 8 weeks. During that time they were utilizing according to the patient silver nitrate along with a foam doughnut and then Coban to secure in place in the cast in place. With that being said I do not have the actual records to review we are going to try to get a hold of those unfortunately they would not flow over into care everywhere I did look today. She has been seeing Dr. Doran Durand and his physician assistant Larkin Ina up until the end of May and apparently is still seeing them on a regular basis  every 2 weeks roughly. She has also tried Iodosorb without effect here. 01/24/2020 upon evaluation today patient actually appears to be doing quite well with regard to her wounds. She has been tolerating the dressing changes without complication. Fortunately there is no signs of active infection spreading which is good news. Her culture did show signs of Staph aureus I did place her on Augmentin due to the erythema surrounding the wound. With that being said the wound does appear to be doing better she has her longer walking cast/boot and I think that is actually good for her for the time being. I am considering reinitiating total contact cast when she gets back from vacation but next week she will actually be out of town at [employer] she knows not to get in the water but she still obviously is planning to enjoy herself she is going to take it easy on her foot however. 02/07/2020 upon evaluation today patient appears to be doing fairly well in regard to her ulcer on her foot. Fortunately there is no signs of severe infection at this time which is great news and overall very pleased in that regard. With that being said I do think that she could still benefit from a total contact cast. Nonetheless she is using her walking boot which at least provide some protection and that it prevents some of the friction occurring when she is ambulating. 02/14/2020 upon evaluation today patient appears to be doing well with regard to her foot ulcer. This is actually measuring a little bit smaller yet again this  week. Overall very pleased with where things stand and I do not see any signs of active infection at this time which is also good news. Since she is measuring better the patient has wanting to somewhat hold off on proceeding with the total contact cast which I think is reasonable at this point. 02/28/2020 on evaluation today patient appears to be doing well in general in regard to her wound although she has a lot of  callus buildup as compared to last time I saw her. This is can require sharp debridement today. I do believe she really needs the total contact cast as well which we have discussed previous. 7/23; patient comes in for a total contact cast change 03/06/2020 on evaluation today patient appears to be doing quite well with regard to her wounds. Fortunately the wound bed is measuring smaller and looking much better there is little callus noted although there is some debridement necessary today. 03/13/2020 on evaluation today patient's wound actually appears to be doing excellent which is great news. With that being said unfortunately she is having some issues currently with her left leg where she does have cellulitis it appears. This may have come from an area that rubbed underneath the cast from last week that we noted we padded that area and it looks to be doing excellent at this point but nonetheless the leg was somewhat painful, swollen, and somewhat erythematous. She also had an elevated white blood cell count of 11.5 based on what I saw on looking at her records from the med center in [facility] from where she was seen yesterday. Unfortunately with [place] having a provider on vacation there was no one here in the clinic in the afternoon when she called therefore she went to the ER as advised. Subsequently they did not cut off the cast as they did not have anyone from orthopedics there to do so and subsequently also did not have the ability to do the Doppler for evaluation of DVT They recommended therefore given her dose of Eliquis as well as . Augmentin and sent her home to come see [place] today to have the cast taken off and then she is supposed to go back to have the study for DVT performed they are following. 03/20/2020 upon evaluation today patient appears to be doing well with regard to her foot all things considered we have not been able to use the total contact cast due to the infection that she had last  week. She has been on the doxycycline and she had a 10-day supply of that I do believe that is helping and her leg appears to be doing better. With that being said there is fortunately no signs of active infection systemically at this time which is good news. No fevers, chills, nausea, vomiting, or diarrhea. 03/27/2020 upon evaluation today patient appears to be doing well with regard to her foot ulcer. There does not appear to be signs of active infection which is great news. Overall I am very pleased with where things stand at this point. 04/03/2020 upon evaluation today patient appears to be doing pretty well in regard to the overall appearance of her wound. Fortunately there is no signs of active infection at this time which is great news. No fevers, chills, nausea, vomiting, or diarrhea. With that being said she does have some blue-green drainage that actually is a little bit concerning to me for the possibility of Pseudomonas. I discussed that with the patient today. With that being  said I do believe that we may be able to manage this however with the topical antibiotic cream as opposed to having to do anything oral especially since she seems to be doing so well with overall appearance of the wound. 04/10/2020 on evaluation today patient appears to be doing about the same roughly in regard to the overall size of her wound. With that being said she fortunately has not shown any signs of worsening overall which is good news. I do believe that she is doing a great job trying to offload but again she may still do better with the cast. I do not see in the blue-green drainage that we noticed previously I do believe the gentamicin help in this regard. 04/17/2020 on evaluation today patient's wound appears to be doing about the same at this point. There is no significant improvement at this point. No fever chills noted. She is up for put the cast back on the day. That she states in a couple weeks she will need  to have this off to go to a workshop. 04/24/2020 on evaluation today patient appears to be doing significantly better in regard to her wound. Fortunately there is no signs of active infection and overall feel like she is making great progress the cast seems to have done excellent for her. 05/01/2020 upon evaluation today patient presents for reevaluation she really does not appear to be doing too badly in regard to the actual wound on the left foot we have been managing. Unfortunately she has bilateral lower extremity edema with blisters between the webspace of her first and second toe on both feet. She has a tremendous amount of edema in the legs which I think is where this is coming from it does not appear to be infected but nonetheless I do believe this is can be something that needs to be addressed today. Obviously this means we probably will not be putting the cast on at this point. She attributes this to the fact that she was sitting with her feet on the floor much longer during a conference last week she had a great time but unfortunately had a lot of complications as a result. 05/08/2020 upon evaluation today patient appears to be doing somewhat better in regard to her wounds at this time. Fortunately there is no signs of active infection which is great news. With that being said I do believe that the blisters have ruptured and unfortunately did not just reattach I will remove some of the blistered tissue today. With that being said I do think the wound itself on the plantar aspect of left foot does need to have sharp debridement. 05/15/2020 upon evaluation today patient appears to be doing about the same in regard to her foot ulcer. Unfortunately in the past week her husband had a fall where he sustained a mild traumatic brain bleed. Fortunately he is doing better but being that he was in the hospital she had a walk on this a lot more. The wound does not appear to be any better is also not really  appearing to be significantly worse which is good news. There is no signs of active infection at this time. 10/14; patient with a small diabetic wound on the medial part of her left foot. We have been using silver collagen a total contact cast making good progress. I think the patient had a series of blisters on her dorsal foot probably secondary to having her legs recumbent for 3 days while in a  conference in [place]. We wrapped her leg last week these are all healed. We did not previously have her in compression on the right leg. Electronic Signature(s) Signed: 05/23/2020 5:33:07 PM By: Linton Ham MD Entered By: Linton Ham on 05/23/2020 13:23:56 -------------------------------------------------------------------------------- Physical Exam Details Patient Name: Date of Service: [name] 05/23/2020 12:30 PM Medical Record Number: [id number] Patient Account Number: [id number] Date of Birth/Sex: Treating RN: [birth year] (74 y.o. [name] Primary Care Provider: [name] Other Clinician: Referring Provider: Treating Provider/Extender: Genella Rife in Treatment: 18 Notes Wound exam; the patient's area on the medial left foot has some callus and flaking skin around the edges. This comes off fairly easily. There is nothing open on the right dorsal foot. The blisters from last week have dry flaking skin but no open wound Electronic Signature(s) Signed: 05/23/2020 5:33:07 PM By: Linton Ham MD Entered By: Linton Ham on 05/23/2020 13:25:27 -------------------------------------------------------------------------------- Physician Orders Details Patient Name: Date of Service: [name] 05/23/2020 12:30 PM Medical Record Number: [id number] Patient Account Number: [id number] Date of Birth/Sex: Treating RN: [birth year] (74 y.o. [name] Primary Care Provider: [name] Other Clinician: Referring Provider: Treating  Provider/Extender: Genella Rife in Treatment: 47 Verbal / Phone Orders: No Diagnosis Coding Follow-up Appointments ppointment in 1 week. - Wednesday with Margarita Grizzle. Return A Dressing Change Frequency Wound #1 Left,Medial,Plantar Foot Do not change entire dressing for one week. Skin Barriers/Peri-Wound Care Moisturizing lotion - to both legs Wound Cleansing Wound #1 Left,Medial,Plantar Foot May shower with protection. Primary Wound Dressing Wound #1 Left,Medial,Plantar Foot Silver Collagen - moisten with hydrogel Secondary Dressing Wound #1 Left,Medial,Plantar Foot Foam - donut Dry Gauze Drawtex - cut to fit inside wound to hold collagen in place Edema Control Avoid standing for long periods of time Elevate legs to the level of the heart or above for 30 minutes daily and/or when sitting, a frequency of: Exercise regularly Support Garment 20-30 mm/Hg pressure to: - bring in juxtalite next week for wound clinic to show how to apply to right leg. Off-Loading Total Contact Cast to Left Lower Extremity Electronic Signature(s) Signed: 05/23/2020 5:33:07 PM By: Linton Ham MD Signed: 05/23/2020 5:51:00 PM By: Deon Pilling Entered By: Deon Pilling on 05/23/2020 13:17:20 -------------------------------------------------------------------------------- Problem List Details Patient Name: Date of Service: [name] 05/23/2020 12:30 PM Medical Record Number: [id number] Patient Account Number: [id number] Date of Birth/Sex: Treating RN: 10-05-1945 (74 y.o. [name] Primary Care Provider: [name] Other Clinician: Referring Provider: Treating Provider/Extender: Genella Rife in Treatment: 18 Active Problems ICD-10 Encounter Code Description Active Date MDM Diagnosis E11.621 Type 2 diabetes mellitus with foot ulcer 01/17/2020 No Yes L97.522 Non-pressure chronic ulcer of other part of left foot with fat layer exposed  01/17/2020 No Yes M14.672 Charcot's joint, left ankle and foot 01/17/2020 No Yes E11.40 Type 2 diabetes mellitus with diabetic neuropathy, unspecified 01/17/2020 No Yes I10 Essential (primary) hypertension 01/17/2020 No Yes Inactive Problems Resolved Problems Electronic Signature(s) Signed: 05/23/2020 5:33:07 PM By: Linton Ham MD Entered By: Linton Ham on 05/23/2020 13:21:22 -------------------------------------------------------------------------------- Progress Note Details Patient Name: Date of Service: [name] 05/23/2020 12:30 PM Medical Record Number: [id number] Patient Account Number: [id number] Date of Birth/Sex: Treating RN: 01-Jan-1946 (74 y.o. [name] Primary Care Provider: [name] Other Clinician: Referring Provider: Treating Provider/Extender: Genella Rife in Treatment: 18 Subjective History of Present Illness (HPI) 01/17/2020 upon evaluation  today patient presents for initial evaluation here in our clinic concerning issues she has been having with a left medial/plantar foot ulcer. This is actually been an issue for her since October 2020. She has been seeing Dr. Doran Durand for quite some time during that course. Fortunately there is no signs of active infection at this time. Or least no mention of this to have seen in general. With that being said unfortunately I do see some signs of erythema noted today that does have me concerned about the possibility of infection at this point in the surrounding area of the wound. There is also a warm to touch at the site which is somewhat concerning. Fortunately there is no evidence of systemic infection which is great news. The patient does have a history of diabetes mellitus type 2, Charcot foot which is what led to the wound, and hypertension. She notes that she was in a cast for some time with Dr. Doran Durand for about 8 weeks. During that time they were utilizing according to the patient silver  nitrate along with a foam doughnut and then Coban to secure in place in the cast in place. With that being said I do not have the actual records to review we are going to try to get a hold of those unfortunately they would not flow over into care everywhere I did look today. She has been seeing Dr. Doran Durand and his physician assistant Larkin Ina up until the end of May and apparently is still seeing them on a regular basis every 2 weeks roughly. She has also tried Iodosorb without effect here. 01/24/2020 upon evaluation today patient actually appears to be doing quite well with regard to her wounds. She has been tolerating the dressing changes without complication. Fortunately there is no signs of active infection spreading which is good news. Her culture did show signs of Staph aureus I did place her on Augmentin due to the erythema surrounding the wound. With that being said the wound does appear to be doing better she has her longer walking cast/boot and I think that is actually good for her for the time being. I am considering reinitiating total contact cast when she gets back from vacation but next week she will actually be out of town at [employer] she knows not to get in the water but she still obviously is planning to enjoy herself she is going to take it easy on her foot however. 02/07/2020 upon evaluation today patient appears to be doing fairly well in regard to her ulcer on her foot. Fortunately there is no signs of severe infection at this time which is great news and overall very pleased in that regard. With that being said I do think that she could still benefit from a total contact cast. Nonetheless she is using her walking boot which at least provide some protection and that it prevents some of the friction occurring when she is ambulating. 02/14/2020 upon evaluation today patient appears to be doing well with regard to her foot ulcer. This is actually measuring a little bit smaller yet again this  week. Overall very pleased with where things stand and I do not see any signs of active infection at this time which is also good news. Since she is measuring better the patient has wanting to somewhat hold off on proceeding with the total contact cast which I think is reasonable at this point. 02/28/2020 on evaluation today patient appears to be doing well in general in regard to  her wound although she has a lot of callus buildup as compared to last time I saw her. This is can require sharp debridement today. I do believe she really needs the total contact cast as well which we have discussed previous. 7/23; patient comes in for a total contact cast change 03/06/2020 on evaluation today patient appears to be doing quite well with regard to her wounds. Fortunately the wound bed is measuring smaller and looking much better there is little callus noted although there is some debridement necessary today. 03/13/2020 on evaluation today patient's wound actually appears to be doing excellent which is great news. With that being said unfortunately she is having some issues currently with her left leg where she does have cellulitis it appears. This may have come from an area that rubbed underneath the cast from last week that we noted we padded that area and it looks to be doing excellent at this point but nonetheless the leg was somewhat painful, swollen, and somewhat erythematous. She also had an elevated white blood cell count of 11.5 based on what I saw on looking at her records from the med center in [facility] from where she was seen yesterday. Unfortunately with [place] having a provider on vacation there was no one here in the clinic in the afternoon when she called therefore she went to the ER as advised. Subsequently they did not cut off the cast as they did not have anyone from orthopedics there to do so and subsequently also did not have the ability to do the Doppler for evaluation of DVT They recommended  therefore given her dose of Eliquis as well as . Augmentin and sent her home to come see [place] today to have the cast taken off and then she is supposed to go back to have the study for DVT performed they are following. 03/20/2020 upon evaluation today patient appears to be doing well with regard to her foot all things considered we have not been able to use the total contact cast due to the infection that she had last week. She has been on the doxycycline and she had a 10-day supply of that I do believe that is helping and her leg appears to be doing better. With that being said there is fortunately no signs of active infection systemically at this time which is good news. No fevers, chills, nausea, vomiting, or diarrhea. 03/27/2020 upon evaluation today patient appears to be doing well with regard to her foot ulcer. There does not appear to be signs of active infection which is great news. Overall I am very pleased with where things stand at this point. 04/03/2020 upon evaluation today patient appears to be doing pretty well in regard to the overall appearance of her wound. Fortunately there is no signs of active infection at this time which is great news. No fevers, chills, nausea, vomiting, or diarrhea. With that being said she does have some blue-green drainage that actually is a little bit concerning to me for the possibility of Pseudomonas. I discussed that with the patient today. With that being said I do believe that we may be able to manage this however with the topical antibiotic cream as opposed to having to do anything oral especially since she seems to be doing so well with overall appearance of the wound. 04/10/2020 on evaluation today patient appears to be doing about the same roughly in regard to the overall size of her wound. With that being said she fortunately has  not shown any signs of worsening overall which is good news. I do believe that she is doing a great job trying to offload but  again she may still do better with the cast. I do not see in the blue-green drainage that we noticed previously I do believe the gentamicin help in this regard. 04/17/2020 on evaluation today patient's wound appears to be doing about the same at this point. There is no significant improvement at this point. No fever chills noted. She is up for put the cast back on the day. That she states in a couple weeks she will need to have this off to go to a workshop. 04/24/2020 on evaluation today patient appears to be doing significantly better in regard to her wound. Fortunately there is no signs of active infection and overall feel like she is making great progress the cast seems to have done excellent for her. 05/01/2020 upon evaluation today patient presents for reevaluation she really does not appear to be doing too badly in regard to the actual wound on the left foot we have been managing. Unfortunately she has bilateral lower extremity edema with blisters between the webspace of her first and second toe on both feet. She has a tremendous amount of edema in the legs which I think is where this is coming from it does not appear to be infected but nonetheless I do believe this is can be something that needs to be addressed today. Obviously this means we probably will not be putting the cast on at this point. She attributes this to the fact that she was sitting with her feet on the floor much longer during a conference last week she had a great time but unfortunately had a lot of complications as a result. 05/08/2020 upon evaluation today patient appears to be doing somewhat better in regard to her wounds at this time. Fortunately there is no signs of active infection which is great news. With that being said I do believe that the blisters have ruptured and unfortunately did not just reattach I will remove some of the blistered tissue today. With that being said I do think the wound itself on the plantar aspect  of left foot does need to have sharp debridement. 05/15/2020 upon evaluation today patient appears to be doing about the same in regard to her foot ulcer. Unfortunately in the past week her husband had a fall where he sustained a mild traumatic brain bleed. Fortunately he is doing better but being that he was in the hospital she had a walk on this a lot more. The wound does not appear to be any better is also not really appearing to be significantly worse which is good news. There is no signs of active infection at this time. 10/14; patient with a small diabetic wound on the medial part of her left foot. We have been using silver collagen a total contact cast making good progress. I think the patient had a series of blisters on her dorsal foot probably secondary to having her legs recumbent for 3 days while in a conference in [place]. We wrapped her leg last week these are all healed. We did not previously have her in compression on the right leg. Objective Constitutional Vitals Time Taken: 12:55 PM, Height: 66 in, Weight: 245 lbs, BMI: 39.5, Temperature: 98.7 F, Pulse: 112 bpm, Respiratory Rate: 18 breaths/min, Blood Pressure: 168/74 mmHg, Capillary Blood Glucose: 160 mg/dl. Integumentary (Hair, Skin) Wound #1 status is Open. Original cause of  wound was Gradually Appeared. The wound is located on the [place]. The wound measures 0.4cm length x 0.4cm width x 0.3cm depth; 0.126cm^2 area and 0.038cm^3 volume. There is Fat Layer (Subcutaneous Tissue) exposed. There is no tunneling noted. There is a small amount of serous drainage noted. The wound margin is well defined and not attached to the wound base. There is large (67-100%) pink granulation within the wound bed. There is no necrotic tissue within the wound bed. General Notes: callus to periwound. Wound #5 status is Healed - Epithelialized. Original cause of wound was Blister. The wound is located on the Right,Distal,Dorsal Foot.  The wound measures 0cm length x 0cm width x 0cm depth; 0cm^2 area and 0cm^3 volume. Assessment Active Problems ICD-10 Type 2 diabetes mellitus with foot ulcer Non-pressure chronic ulcer of other part of left foot with fat layer exposed Charcot's joint, left ankle and foot Type 2 diabetes mellitus with diabetic neuropathy, unspecified Essential (primary) hypertension Procedures Wound #1 Pre-procedure diagnosis of Wound #1 is a Diabetic Wound/Ulcer of the Lower Extremity located on the Left,Medial,Plantar Foot . There was a T Contact otal Cast Procedure by Ricard Dillon., MD. Post procedure Diagnosis Wound #1: Same as Pre-Procedure Plan Follow-up Appointments: Return Appointment in 1 week. - Wednesday with Margarita Grizzle. Dressing Change Frequency: Wound #1 Left,Medial,Plantar Foot: Do not change entire dressing for one week. Skin Barriers/Peri-Wound Care: Moisturizing lotion - to both legs Wound Cleansing: Wound #1 Left,Medial,Plantar Foot: May shower with protection. Primary Wound Dressing: Wound #1 Left,Medial,Plantar Foot: Silver Collagen - moisten with hydrogel Secondary Dressing: Wound #1 Left,Medial,Plantar Foot: Foam - donut Dry Gauze Drawtex - cut to fit inside wound to hold collagen in place Edema Control: Avoid standing for long periods of time Elevate legs to the level of the heart or above for 30 minutes daily and/or when sitting, a frequency of: Exercise regularly Support Garment 20-30 mm/Hg pressure to: - bring in juxtalite next week for wound clinic to show how to apply to right leg. Off-Loading: T Contact Cast to Left Lower Extremity otal 1. I do not feel strongly about wrapping the right leg at this point. She has ordered juxta lite stockings. We were not using stockings or compression before. 2. T contact cast replaced on the left. Wound looks very healthy dimensions are better otal Electronic Signature(s) Signed: 05/23/2020 5:33:07 PM By: Linton Ham  MD Entered By: Linton Ham on 05/23/2020 13:26:07 -------------------------------------------------------------------------------- Total Contact Cast Details Patient Name: Date of Service: [name] 05/23/2020 12:30 PM Medical Record Number: [id number] Patient Account Number: [id number] Date of Birth/Sex: Treating RN: 08/29/1945 (74 y.o. [name] Primary Care Provider: [name] Other Clinician: Referring Provider: Treating Provider/Extender: Genella Rife in Treatment: 18 T Contact Cast Applied for Wound Assessment: otal Wound #1 Left,Medial,Plantar Foot Performed By: Physician Ricard Dillon., MD Post Procedure Diagnosis Same as Pre-procedure Electronic Signature(s) Signed: 05/23/2020 5:33:07 PM By: Linton Ham MD Entered By: Linton Ham on 05/23/2020 13:22:46 -------------------------------------------------------------------------------- SuperBill Details Patient Name: Date of Service: [name] 05/23/2020 Medical Record Number: [id number] Patient Account Number: [id number] Date of Birth/Sex: Treating RN: Nov 01, 1945 (74 y.o. [name] Primary Care Provider: [name] Other Clinician: Referring Provider: Treating Provider/Extender: Genella Rife in Treatment: 18 Diagnosis Coding ICD-10 Codes Code Description E11.621 Type 2 diabetes mellitus with foot ulcer L97.522 Non-pressure chronic ulcer of other part of left foot with fat layer exposed M14.672 Charcot's joint, left ankle and foot  E11.40 Type 2 diabetes mellitus with diabetic neuropathy, unspecified I10 Essential (primary) hypertension Facility Procedures CPT4 Code: [id number] Description: [phone] - APPLY TOTAL CONTACT LEG CAST ICD-10 Diagnosis Description L97.522 Non-pressure chronic ulcer of other part of left foot with fat layer exposed E11.621 Type 2 diabetes mellitus with foot ulcer Modifier: Quantity: 1 Physician  Procedures : CPT4 Code Description Modifier [id number] - WC PHYS APPLY TOTAL CONTACT CAST ICD-10 Diagnosis Description L97.522 Non-pressure chronic ulcer of other part of left foot with fat layer exposed E11.621 Type 2 diabetes mellitus with foot ulcer Quantity: 1 Electronic Signature(s) Signed: 05/23/2020 5:33:07 PM By: Linton Ham MD Entered By: Linton Ham on 05/23/2020 13:26:17

## 2020-05-24 ENCOUNTER — Telehealth (INDEPENDENT_AMBULATORY_CARE_PROVIDER_SITE_OTHER): Payer: Medicare Other | Admitting: Cardiovascular Disease

## 2020-05-24 VITALS — BP 131/69 | HR 97

## 2020-05-24 DIAGNOSIS — G4733 Obstructive sleep apnea (adult) (pediatric): Secondary | ICD-10-CM | POA: Diagnosis not present

## 2020-05-24 DIAGNOSIS — M25473 Effusion, unspecified ankle: Secondary | ICD-10-CM | POA: Diagnosis not present

## 2020-05-24 DIAGNOSIS — E785 Hyperlipidemia, unspecified: Secondary | ICD-10-CM | POA: Diagnosis not present

## 2020-05-24 DIAGNOSIS — E118 Type 2 diabetes mellitus with unspecified complications: Secondary | ICD-10-CM | POA: Diagnosis not present

## 2020-05-24 DIAGNOSIS — I10 Essential (primary) hypertension: Secondary | ICD-10-CM | POA: Diagnosis not present

## 2020-05-24 NOTE — Patient Instructions (Addendum)
Medication Instructions:  No changes *If you need a refill on your cardiac medications before your next appointment, please call your pharmacy*   Lab Work: None ordered  Testing/Procedures: None ordered   Follow-Up: At [facility], you and your health needs are our priority.  As part of our continuing mission to provide you with exceptional heart care, we have created designated Provider Care Teams.  These Care Teams include your primary Cardiologist (physician) and Advanced Practice Providers (APPs -  Physician Assistants and Nurse Practitioners) who all work together to provide you with the care you need, when you need it.  We recommend signing up for the patient portal called "MyChart".  Sign up information is provided on this After Visit Summary.  MyChart is used to connect with patients for Virtual Visits (Telemedicine).  Patients are able to view lab/test results, encounter notes, upcoming appointments, etc.  Non-urgent messages can be sent to your provider as well.   To learn more about what you can do with MyChart, go to [email or web address].    Your next appointment:   6 month(s)  You will receive a reminder letter in the mail two months in advance. If you don't receive a letter, please call our office to schedule the follow-up appointment.   The format for your next appointment:   In Person  Provider:    Dr. Shelva Majestic   Other Instructions None

## 2020-05-24 NOTE — Progress Notes (Signed)
Virtual Visit via Video Note   This visit type was conducted due to national recommendations for restrictions regarding the COVID-19 Pandemic (e.g. social distancing) in an effort to limit this patient's exposure and mitigate transmission in our community.  Due to her co-morbid illnesses, this patient is at least at moderate risk for complications without adequate follow up.  This format is felt to be most appropriate for this patient at this time.  All issues noted in this document were discussed and addressed.  A limited physical exam was performed with this format.  Please refer to the patient's chart for her consent to telehealth for [facility].   The patient was identified using 2 identifiers.  Date:  05/24/2020   ID:  [name] [name], DOB 05-26-1946, MRN [id number]  Patient Location: Home Provider Location: Office  PCP:  Binnie Rail, MD  Cardiologist:  Shelva Majestic, MD  Electrophysiologist:  None   Evaluation Performed:  Follow-Up Visit  Chief Complaint:  4 month F/U  History of Present Illness:    [name] [name] is a 74 y.o. female who presents for this telemedicine visit for cardiology/sleep evaluation.  I last evaluated her in a telemedicine visit in June 2021.  [name] [name] is a retired [employer]  who has a history of hypertension, obesity, psoriatic arthritis, DVT, which occurred while traveling to [place], as well as complex obstructive sleep apnea.  She has had difficulty in the past with CPAP therapy. She has been able to tolerate BiPAP and has had a Resp[employer]ics BiPAP Auto unit well but also has had difficulty with some of her high pressure requirements. In April 2014 I changed her maximum BiPAP pressure to 19 and her maximum EPAP pressure to 15. I also reduced her minimum EPAP pressure to 6 and her minimal IPAP pressure to 10 with pressure support of 4 and changed her Bi_Flex to 3. She has been followed by [facility].  Clinically, she had felt markedly improved with  BiPAP.  Compared to prior CPAP therapy.  She has psoriatic arthritis and is on Humira as well as methotrexate followed by rheumatology.  She also has a Charcot joint in her right foot.   She is on Cymbalta for pain relief relative to this. She does not routinely exercise.  She keeps busy moving around, but typically does not do aerobic activity.  She has not been successful with significant weight loss.   She recently, she has been taking lisinopril HCTZ 20/12.5 mg for hypertension.  She had experienced an episode of chest discomfort which she felt was like a cramp and she noticed this most when standing up, but was associated with diaphoresis and shortness of breath.  She does have family history for coronary artery disease both in her mother as well as in her maternal grandmother. She has not been able to be very active due to her Charcot foot.  She also had noticed some mild shortness of breath with activity.  She eventually was referred for a nuclear perfusion study on 01/31/2015 which revealed normal perfusion and function; ejection fraction 64%.  When I last saw her in February 2017, her BiPAP machine had begun to fail.  She continues to use CPAP with 100% compliance and cannot sleep without it.  She typically sleeps for at least 8 hours per night.  She had taken her BiPAP machine to [place] who stated that her machine essentially had stopped working and is in need for new machine.   She received a  new Respironics Dream station auto BiPAP unit in April 2017.  She uses a Customer service manager FX full face mask, medium size.  She feels that the machine has been working well but she has had difficulty with her sleep.  At times she feels that she is not getting enough pressure.  Previously she had been set on a BiPAP auto unit with an EPAP minimum of 8 and IPAP max of 19 , which had worked well.  I obtained a new download today from 02/16/2016 through 03/16/2016.  She is 100% compliant and is averaging 8 hours and  25 minutes of sleep.  Apparently, she is not been set on auto and has been on and IPAP of 14 and an EPAP of 10.  Her AHI is increased at 25.1. I changed her BiPAP mode from a fixed pressure to auto BiPAP initiating atenolol over 6 with potential titration up to 20/16.  I also allowed for pressure support to change from 4-6 as needed.  IN 2018, she felt well from a cardiac perspective, but has had significant infections including MRSA, strep throat, UTI, and she had fractured her right ankle.  As result, she has noticed some ankle swelling right greater than left.  She continues to use her BiPAP and believes she is feeling much better than she had in the past.  [name] is her DME company.  A download in the office from 01/26/2017 through 04/25/2017.  She is 100% compliance.  She is averaging 8 hours and 36 minutes of sleep per night.  Her average device IPAP pressure was 18.2 and average device CPAP pressure greater than 90% of the time was 14.1.  AHI continued to be elevated at 21.2. An Epworth scale score was calculated in the office today and this endorsed at 4, arguing against daytime sleepiness.  I last saw her, she was sleeping adequate duration.  Her AHI remains elevated I suggested that she reduce ramp time from 30 minutes down to 20 minutes and increased her starting pressure from 4 up to 6 cm.  I saw her in September 2019 at which time she had again started to notice  symptoms where she was waking up at night.  She noted leaks from her mask at higher pressures.  She feels that she is more  tired.  She had some swelling right lower extremity greater than left and she has a Charcot right foot.  She had broken her right ankle in August 2018.  She denied any chest pain.  She is unaware of any cardiac arrhythmias.  Her daughter, [name] is a GI physician who had been practicing in [facility] and will be moving up to work with [employer].  She is grateful that she will now  have her twin granddaughters close by.  She was recently placed on Cimzia for her psoriatic arthritis to take in addition to her methotrexate.  She is followed by [name] for her rheumatologic issues.   When I evaluated her in December 2019 she was,sleeping 8 hours 29 minutes and her average IPAP pressure greater than 90% 16.7 with an average EPAP pressure of 12.4.  I reduced her ramp time down to 20 minutes.  She was having issues with her mask and I recommended a trial of the new DreamWear full facemask by either a [employer]ics or a [facility].  Over the past several months, she has not been able to be active due to her fracture of her fifth metatarsal.  She was in a hard cast for 2 months with no weightbearing.  As result she has not been able to exercise.  She has gained weight.g.  She continues to use her BiPAP.  A new download was obtained and she again is compliant.  Her average device IPAP pressure greater than 90% of the time was 18.1 with a maximum titrated IPAP pressure of 20.  AHI remained elevated at 17.9.  She went to [place] in January 2020 and unfortunately became ill and spent the entire trip in the hospital due to ischemic colitis.  She was subsequently evaluated by Fabian Sharp in a telemedicine visit in June 2020.  When I last evaluated her in a telemedicine visit in June 2021 she had not been able to do much walking because of tenderness from her bone on the plantar aspect of her foot.  She has fallen arches.  She is being evaluated by Dr. Doran Durand.  Around Thanksgiving 2020 both she and her husband both became ill with COVID-19 pneumonia and both were hospitalized for approximately a week.  She had temperatures of 103, she was treated with remdesivir, convalescent plasma in addition to steroids.  She has subsequently not regained her sense of taste or smell and has continued to experience fatigability.  During that time she was not able to use BiPAP.  Apparently she was having issues  stating that Medicare is going to deny her BiPAP.  She has continued to use BiPAP therapy.  I attempted to try to get a download from her [employer] website today but we were unable to do this in the office.  I have reached out to Barry Brunner who will contact [employer] to see if we can obtain her most recent download.  Ms. Bankson continues to be on lisinopril/HCTZ for hypertension, rosuvastatin 5 mg for hyperlipidemia.  She was on methotrexate for rheumatoid arthritis but this was stopped approximately 6 weeks ago.  She is diabetic on metformin.    I scheduled her for a 2D echo Doppler study to reassess LV function following her Covid infection which was done on February 06, 2020.  LV function was excellent with EF of 65 to 70%.  There was grade 1 diastolic dysfunction.  There was mild mitral regurgitation.  Since her last evaluation, her Respironics dream station has been on recall.  She has registered with [employer] and attempt to get a new machine.  She had called the office shortly thereafter wondering if she should still use therapy.  At that time with the severity of her sleep apnea and the incidence of only 0.3% of devices with problems I had recommended she continue therapy.  She uses [employer] as her DME company.  I asked her today if she had been contacted regarding insertion of a bacteria inlet filter device which she could place into her tubing but apparently this had not been done by [employer].  She feels well.  She again has been immobile and that her left leg is in a hard cast making it difficult to walk.  She does note some occasional ankle swelling.  She was wondering if she should continue with aspirin which she has been taking 81 mg 3 times a week.  She admits to being under increased stress since her husband unfortunately had a brain bleed but is starting to improve.  She has been on lisinopril HCT 20/25 mg.  She has continued to be on lipid-lowering therapy with rosuvastatin 5 mg and LDL  cholesterol was 40.  She presents for a telemedicine evaluation.   The patient does not have symptoms concerning for COVID-19 infection (fever, chills, cough, or new shortness of breath).    Past Medical History:  Diagnosis Date  . Arthritis   . Closed fracture of fifth metatarsal bone 05/13/2018  . Deep vein thrombosis ([place])    right calf - 05/2012   . Diabetes mellitus without complication (HCC)    diet controlled   . GERD (gastroesophageal reflux disease)   . Hyperlipidemia   . Hypertension    Ejection fraction =>55% Left ventricular systolic function is normal. Left ventricular wall motion is normal    . Lymphocytic colitis   . Neuropathy    diabetic - in bilateral feet   . Pityriasis lichenoides chronica   . Sleep apnea    bipap   Past Surgical History:  Procedure Laterality Date  . DILATION AND CURETTAGE OF UTERUS    . HAMMER TOE SURGERY    . right hand surgery      due to blood infection   . torn meniscus repair      right knee   . TOTAL HIP ARTHROPLASTY  09/13/2012   Procedure: TOTAL HIP ARTHROPLASTY ANTERIOR APPROACH;  Surgeon: Mauri Pole, MD;  Location: [facility];  Service: Orthopedics;  Laterality: Right;     Current Meds  Medication Sig  . acetaminophen (TYLENOL) 325 MG tablet Take 2 tablets (650 mg total) by mouth every 6 (six) hours as needed for mild pain or headache.  . allopurinol (ZYLOPRIM) 100 MG tablet Take 1 tablet (100 mg total) by mouth 2 (two) times daily.  Marland Kitchen ALPRAZolam (XANAX) 0.25 MG tablet Take 1 tablet (0.25 mg total) by mouth 2 (two) times daily as needed for anxiety.  Marland Kitchen aspirin 81 MG tablet Take 81 mg by mouth every other day.   Marland Kitchen azelastine (ASTELIN) 0.1 % nasal spray Place 2 sprays into both nostrils 2 (two) times daily. Use in each nostril as directed  . Budesonide (UCERIS) 9 MG TB24 Take 9 mg by mouth daily.   . Certolizumab Pegol (CIMZIA Mar-Mac) Inject 400 mg into the skin every 30 (thirty) days. 200 mg on each side of stomach  .  Cholecalciferol (VITAMIN D3) 1000 UNITS CAPS Take 1,000 Units by mouth 2 (two) times daily.   . citalopram (CELEXA) 40 MG tablet TAKE 1 TABLET BY MOUTH EVERY DAY  . Cyanocobalamin (VITAMIN B-12) 1000 MCG SUBL Place 1,000 mcg under the tongue daily.  . cyclobenzaprine (FLEXERIL) 10 MG tablet Take 1 tablet (10 mg total) by mouth at bedtime.  . docusate sodium (COLACE) 100 MG capsule Take 1 capsule (100 mg total) by mouth 2 (two) times daily.  . folic acid (FOLVITE) 1 MG tablet TAKE 1 TABLET(1 MG) BY MOUTH DAILY  . Insulin Pen Needle (B-D UF III MINI PEN NEEDLES) 31G X 5 MM MISC Use daily for insulin pen  . LEVEMIR FLEXTOUCH 100 UNIT/ML FlexPen ADMINISTER 20 UNITS UNDER THE SKIN TWICE DAILY  . lisinopril-hydrochlorothiazide (ZESTORETIC) 20-25 MG tablet Take 1 tablet by mouth daily.  Marland Kitchen loperamide (IMODIUM) 2 MG capsule Take 6 mg by mouth 2 (two) times daily.  . Melatonin 5 MG SUBL Place 5 mg under the tongue at bedtime.  . metFORMIN (GLUCOPHAGE-XR) 500 MG 24 hr tablet TAKE 1 TABLET(500 MG) BY MOUTH TWICE DAILY  . omeprazole (PRILOSEC) 40 MG capsule Take 40 mg by mouth at bedtime.  . potassium chloride (KLOR-CON) 10 MEQ tablet TAKE 2 TABLETS(20 MEQ) BY  MOUTH DAILY  . Probiotic Product (ALIGN PO) Take 1 capsule by mouth daily.  . rosuvastatin (CRESTOR) 5 MG tablet Take 1 tablet (5 mg total) by mouth daily.  Marland Kitchen triamcinolone cream (KENALOG) 0.1 % Apply 1 application topically 2 (two) times daily.     Allergies:   Other, Bee venom, Farxiga [dapagliflozin], and Sulfa antibiotics   Social History   Tobacco Use  . Smoking status: Never Smoker  . Smokeless tobacco: Never Used  Substance Use Topics  . Alcohol use: Yes    Comment: occasional wine   . Drug use: No     Family Hx: The patient's family history includes Hypertension in her mother.  ROS:   Please see the history of present illness.    Positive for COVID-19 pneumonia in November 2020, hospitalized for 1 week, treated with random  severe, convalescent plasma and steroids.  Continues to have loss of taste and smell No chest pain No wheezing No palpitations Ischemic colitis hospitalized in [place] pain with fallen arches unable to walk due to bone comfort; Charcot joint Arthritis Currently wearing a hard cast in her left lower leg OSA on BiPAP  All other systems reviewed and are negative.   Prior CV studies:   The following studies were reviewed today:  I reviewed the patient's records from [place], [place].  Recent laboratory was reviewed.  Labs/Other Tests and Data Reviewed:    EKG: No ECG was done today.  ECG from July 07, 2019 when hospitalized with Covid showed sinus tachycardia at 102 bpm.  Right bundle branch block with repolarization changes.  Recent Labs: 07/07/2019: B Natriuretic Peptide 47.0; TSH 1.124 07/11/2019: Magnesium 1.9 03/12/2020: ALT 45; BUN 23; Creatinine, Ser 0.78; Hemoglobin 11.9; Platelets 214; Potassium 4.7; Sodium 133   Recent Lipid Panel Lab Results  Component Value Date/Time   CHOL 116 12/21/2019 11:36 AM   TRIG 134.0 12/21/2019 11:36 AM   HDL 49.10 12/21/2019 11:36 AM   CHOLHDL 2 12/21/2019 11:36 AM   LDLCALC 40 12/21/2019 11:36 AM   LDLDIRECT 139.0 08/15/2019 11:14 AM    Wt Readings from Last 3 Encounters:  03/12/20 245 lb (111.1 kg)  01/12/20 245 lb (111.1 kg)  07/13/19 268 lb 15.4 oz (122 kg)     Objective:    Vital Signs:  BP 131/69   Pulse 97    Since this was a virtual visit I could not physically examine the patient. Breathing was normal and not labored There was no audible wheezing She denied any chest pain or tenderness to palpitation of her chest. Heart rhythm was regular She admits to occasional ankle swelling She was wearing a hard cast of her left leg Normal affect and mood   ASSESSMENT & PLAN:    1.  OSA on BiPAP: Ms. Heggs has a history of severe sleep apnea and had difficulty with CPAP and required BiPAP treatment.  Remotely she had  difficulty with high pressures.  She has a dream station auto BiPAP unit from [employer] and since her last evaluation, this device has been on recall. I had a long discussion with her today regarding the [employer] recall.  A defect occurred in 0.3% of units.  This is a voluntary recall.  She received her new BiPAP unit in 2017 making it 107-1/75 years old.  She has not used an ozone cleaner.  Her symptoms have significantly improved with BiPAP therapy and for this reason I had recommended she continue therapy.  I did contact [facility] today  in the office.  Apparently they have not recommended their patients obtain a bacterial inlet filter which has been recommended by other DME companies.  I discussed this with Ms. Lenise Herald.  I also have contacted [facility] a [facility] who states that there company will be able to provide this bacterial inlet filter to patient's that they do not file for cost of $5.  I provided her with a phone number so that she may contact them and obtain this filter which she will be able to place on her BiPAP machine and attached to the hose which should avoid any rare risk of particulate matter or chemical emission if the foam had degraded within her machine.  I have tried to obtain a download of her machine and my nurse has been on hold with [employer] for some time to obtain this data.  However Ms. Nolley feels she continues to sleep well with her current treatment. 2.  Essential hypertension: Blood pressure today was stable on her lisinopril/HCTZ 20/25 mg.  She states that she has been under increased stress which is slightly increased her blood pressure which typically had been in the 1 10-1 20 range.  Some of the stress has improved with her husband getting better following his brain bleed.. 3.  History of Covid 19 pneumonia: November 2020.  She received treatment with random severe, convalescent plasma and steroids and required hospitalization for a week.  I reviewed her most recent echo  Doppler study which continues to show hyperdynamic LV function with grade 1 diastolic dysfunction. 4.  Hyperlipidemia: Target LDL less than 70 in this diabetic female.  She is tolerating rosuvastatin with LDL cholesterol at 40. 5. Type 2 diabetes mellitus: On Levemir and Metformin therapy 6.  Remote history of DVT which occurred while traveling to [place] 7.  Remote history of ischemic colitis with large GI bleed leading to hospitalization while on vacation in [place] 8. I iscussed benefits and risks of low-dose aspirin therapy and most recent recommendations.  Her DVT occurred in the setting being sedentary and dehydrated while on a long airplane flight but she had experienced GI bleed from ischemic colitis.  If baby aspirin is to be used I discussed taking this 2 to 3 days/week but not more frequently.   COVID-19 Education: The signs and symptoms of COVID-19 were discussed with the patient and how to seek care for testing (follow up with PCP or arrange E-visit).  The importance of social distancing was discussed today.  Time:   Today, I have spent 25 minutes with the patient with telehealth technology discussing the above problems.     Medication Adjustments/Labs and Tests Ordered: Current medicines are reviewed at length with the patient today.  Concerns regarding medicines are outlined above.   Tests Ordered: No orders of the defined types were placed in this encounter.   Medication Changes: No orders of the defined types were placed in this encounter.   Follow Up: Follow-up in office visit in 6 months  Signed, Shelva Majestic, MD  05/24/2020 9:30 AM    [place]

## 2020-05-25 ENCOUNTER — Encounter: Payer: Self-pay | Admitting: Cardiovascular Disease

## 2020-05-27 DIAGNOSIS — H26493 Other secondary cataract, bilateral: Secondary | ICD-10-CM | POA: Diagnosis not present

## 2020-05-27 DIAGNOSIS — E119 Type 2 diabetes mellitus without complications: Secondary | ICD-10-CM | POA: Diagnosis not present

## 2020-05-27 DIAGNOSIS — H04123 Dry eye syndrome of bilateral lacrimal glands: Secondary | ICD-10-CM | POA: Diagnosis not present

## 2020-05-27 DIAGNOSIS — H5213 Myopia, bilateral: Secondary | ICD-10-CM | POA: Diagnosis not present

## 2020-05-27 LAB — HM DIABETES EYE EXAM

## 2020-05-29 ENCOUNTER — Encounter (HOSPITAL_BASED_OUTPATIENT_CLINIC_OR_DEPARTMENT_OTHER): Payer: Medicare Other | Admitting: Physician Assistant

## 2020-05-29 ENCOUNTER — Other Ambulatory Visit: Payer: Self-pay

## 2020-05-29 DIAGNOSIS — E11621 Type 2 diabetes mellitus with foot ulcer: Secondary | ICD-10-CM | POA: Diagnosis not present

## 2020-05-29 DIAGNOSIS — L97522 Non-pressure chronic ulcer of other part of left foot with fat layer exposed: Secondary | ICD-10-CM | POA: Diagnosis not present

## 2020-05-29 DIAGNOSIS — E11610 Type 2 diabetes mellitus with diabetic neuropathic arthropathy: Secondary | ICD-10-CM | POA: Diagnosis not present

## 2020-05-29 DIAGNOSIS — E1140 Type 2 diabetes mellitus with diabetic neuropathy, unspecified: Secondary | ICD-10-CM | POA: Diagnosis not present

## 2020-05-29 NOTE — Progress Notes (Signed)
[name], [name] ([id number]) Visit Report for 05/29/2020 Chief Complaint Document Details Patient Name: Date of Service: [name] [name], [name] [name] 05/29/2020 8:00 A M Medical Record Number: [id number] Patient Account Number: [id number] Date of Birth/Sex: Treating RN: [birth year] (74 y.o. [name] Other Clinician: Referring Provider: Treating Provider/Extender: Adele Schilder in Treatment: 19 Information Obtained from: Patient Chief Complaint Left foot ulcer Electronic Signature(s) Signed: 05/29/2020 4:35:17 PM By: Worthy Keeler PA-C Entered By: Worthy Keeler on 05/29/2020 08:32:10 -------------------------------------------------------------------------------- Debridement Details Patient Name: Date of Service: [name] [name], [name] [name] RBA RA E. 05/29/2020 8:00 A M Medical Record Number: [id number] Patient Account Number: [id number] Date of Birth/Sex: Treating RN: 1946/07/11 (74 y.o. [name] Other Clinician: Referring Provider: Treating Provider/Extender: Adele Schilder in Treatment: 19 Debridement Performed for Assessment: Wound #1 Left,Medial,Plantar Foot Performed By: Physician Worthy Keeler, PA Debridement Type: Debridement Severity of Tissue Pre Debridement: Fat layer exposed Level of Consciousness (Pre-procedure): Awake and Alert Pre-procedure Verification/Time Out Yes - 09:05 Taken: Start Time: 09:05 Pain Control: Lidocaine 4% T opical Solution T Area Debrided (L x W): otal 0.4 (cm) x 0.4 (cm) = 0.16 (cm) Tissue and other material debrided: Viable, Non-Viable, Callus, Slough, Subcutaneous, Slough Level: Skin/Subcutaneous Tissue Debridement Description: Excisional Instrument: Curette Bleeding: Minimum Hemostasis Achieved: Pressure End Time: 09:07 Procedural Pain: 0 Post Procedural Pain: 0 Response to Treatment: Procedure was tolerated well Level of  Consciousness (Post- Awake and Alert procedure): Post Debridement Measurements of Total Wound Length: (cm) 0.4 Width: (cm) 0.4 Depth: (cm) 0.2 Volume: (cm) 0.025 Character of Wound/Ulcer Post Debridement: Improved Severity of Tissue Post Debridement: Fat layer exposed Post Procedure Diagnosis Same as Pre-procedure Electronic Signature(s) Signed: 05/29/2020 4:35:17 PM By: Worthy Keeler PA-C Signed: 05/29/2020 4:55:29 PM By: [name] RN, BSN Entered By: [name] on 05/29/2020 09:06:52 -------------------------------------------------------------------------------- HPI Details Patient Name: Date of Service: [name] [name], [name] [name] RBA RA E. 05/29/2020 8:00 A M Medical Record Number: [id number] Patient Account Number: [id number] Date of Birth/Sex: Treating RN: 02-11-1946 (75 y.o. [name] Other Clinician: Referring Provider: Treating Provider/Extender: Adele Schilder in Treatment: 19 History of Present Illness HPI Description: 01/17/2020 upon evaluation today patient presents for initial evaluation here in our clinic concerning issues she has been having with a left medial/plantar foot ulcer. This is actually been an issue for her since October 2020. She has been seeing Dr. Doran Durand for quite some time during that course. Fortunately there is no signs of active infection at this time. Or least no mention of this to have seen in general. With that being said unfortunately I do see some signs of erythema noted today that does have me concerned about the possibility of infection at this point in the surrounding area of the wound. There is also a warm to touch at the site which is somewhat concerning. Fortunately there is no evidence of systemic infection which is great news. The patient does have a history of diabetes mellitus type 2, Charcot foot which is what led to the wound, and hypertension. She notes that she was in a cast  for some time with Dr. Doran Durand for about 8 weeks. During that time they were utilizing according to the patient silver nitrate along with a foam doughnut and then Coban to secure in place in the cast in place. With that being said I do not have  the actual records to review we are going to try to get a hold of those unfortunately they would not flow over into care everywhere I did look today. She has been seeing Dr. Doran Durand and his physician assistant Larkin Ina up until the end of May and apparently is still seeing them on a regular basis every 2 weeks roughly. She has also tried Iodosorb without effect here. 01/24/2020 upon evaluation today patient actually appears to be doing quite well with regard to her wounds. She has been tolerating the dressing changes without complication. Fortunately there is no signs of active infection spreading which is good news. Her culture did show signs of Staph aureus I did place her on Augmentin due to the erythema surrounding the wound. With that being said the wound does appear to be doing better she has her longer walking cast/boot and I think that is actually good for her for the time being. I am considering reinitiating total contact cast when she gets back from vacation but next week she will actually be out of town at [employer] she knows not to get in the water but she still obviously is planning to enjoy herself she is going to take it easy on her foot however. 02/07/2020 upon evaluation today patient appears to be doing fairly well in regard to her ulcer on her foot. Fortunately there is no signs of severe infection at this time which is great news and overall very pleased in that regard. With that being said I do think that she could still benefit from a total contact cast. Nonetheless she is using her walking boot which at least provide some protection and that it prevents some of the friction occurring when she is ambulating. 02/14/2020 upon evaluation today patient  appears to be doing well with regard to her foot ulcer. This is actually measuring a little bit smaller yet again this week. Overall very pleased with where things stand and I do not see any signs of active infection at this time which is also good news. Since she is measuring better the patient has wanting to somewhat hold off on proceeding with the total contact cast which I think is reasonable at this point. 02/28/2020 on evaluation today patient appears to be doing well in general in regard to her wound although she has a lot of callus buildup as compared to last time I saw her. This is can require sharp debridement today. I do believe she really needs the total contact cast as well which we have discussed previous. 7/23; patient comes in for a total contact cast change 03/06/2020 on evaluation today patient appears to be doing quite well with regard to her wounds. Fortunately the wound bed is measuring smaller and looking much better there is little callus noted although there is some debridement necessary today. 03/13/2020 on evaluation today patient's wound actually appears to be doing excellent which is great news. With that being said unfortunately she is having some issues currently with her left leg where she does have cellulitis it appears. This may have come from an area that rubbed underneath the cast from last week that we noted we padded that area and it looks to be doing excellent at this point but nonetheless the leg was somewhat painful, swollen, and somewhat erythematous. She also had an elevated white blood cell count of 11.5 based on what I saw on looking at her records from the med center in [facility] from where she was seen yesterday.  Unfortunately with [place] having a provider on vacation there was no one here in the clinic in the afternoon when she called therefore she went to the ER as advised. Subsequently they did not cut off the cast as they did not have anyone from orthopedics  there to do so and subsequently also did not have the ability to do the Doppler for evaluation of DVT They recommended therefore given her dose of Eliquis as well as . Augmentin and sent her home to come see [place] today to have the cast taken off and then she is supposed to go back to have the study for DVT performed they are following. 03/20/2020 upon evaluation today patient appears to be doing well with regard to her foot all things considered we have not been able to use the total contact cast due to the infection that she had last week. She has been on the doxycycline and she had a 10-day supply of that I do believe that is helping and her leg appears to be doing better. With that being said there is fortunately no signs of active infection systemically at this time which is good news. No fevers, chills, nausea, vomiting, or diarrhea. 03/27/2020 upon evaluation today patient appears to be doing well with regard to her foot ulcer. There does not appear to be signs of active infection which is great news. Overall I am very pleased with where things stand at this point. 04/03/2020 upon evaluation today patient appears to be doing pretty well in regard to the overall appearance of her wound. Fortunately there is no signs of active infection at this time which is great news. No fevers, chills, nausea, vomiting, or diarrhea. With that being said she does have some blue-green drainage that actually is a little bit concerning to me for the possibility of Pseudomonas. I discussed that with the patient today. With that being said I do believe that we may be able to manage this however with the topical antibiotic cream as opposed to having to do anything oral especially since she seems to be doing so well with overall appearance of the wound. 04/10/2020 on evaluation today patient appears to be doing about the same roughly in regard to the overall size of her wound. With that being said she fortunately has not  shown any signs of worsening overall which is good news. I do believe that she is doing a great job trying to offload but again she may still do better with the cast. I do not see in the blue-green drainage that we noticed previously I do believe the gentamicin help in this regard. 04/17/2020 on evaluation today patient's wound appears to be doing about the same at this point. There is no significant improvement at this point. No fever chills noted. She is up for put the cast back on the day. That she states in a couple weeks she will need to have this off to go to a workshop. 04/24/2020 on evaluation today patient appears to be doing significantly better in regard to her wound. Fortunately there is no signs of active infection and overall feel like she is making great progress the cast seems to have done excellent for her. 05/01/2020 upon evaluation today patient presents for reevaluation she really does not appear to be doing too badly in regard to the actual wound on the left foot we have been managing. Unfortunately she has bilateral lower extremity edema with blisters between the webspace of her first and second toe  on both feet. She has a tremendous amount of edema in the legs which I think is where this is coming from it does not appear to be infected but nonetheless I do believe this is can be something that needs to be addressed today. Obviously this means we probably will not be putting the cast on at this point. She attributes this to the fact that she was sitting with her feet on the floor much longer during a conference last week she had a great time but unfortunately had a lot of complications as a result. 05/08/2020 upon evaluation today patient appears to be doing somewhat better in regard to her wounds at this time. Fortunately there is no signs of active infection which is great news. With that being said I do believe that the blisters have ruptured and unfortunately did not just reattach I  will remove some of the blistered tissue today. With that being said I do think the wound itself on the plantar aspect of left foot does need to have sharp debridement. 05/15/2020 upon evaluation today patient appears to be doing about the same in regard to her foot ulcer. Unfortunately in the past week her husband had a fall where he sustained a mild traumatic brain bleed. Fortunately he is doing better but being that he was in the hospital she had a walk on this a lot more. The wound does not appear to be any better is also not really appearing to be significantly worse which is good news. There is no signs of active infection at this time. 10/14; patient with a small diabetic wound on the medial part of her left foot. We have been using silver collagen a total contact cast making good progress. I think the patient had a series of blisters on her dorsal foot probably secondary to having her legs recumbent for 3 days while in a conference in [place]. We wrapped her leg last week these are all healed. We did not previously have her in compression on the right leg. 05/29/2020 upon evaluation today patient appears to be doing well with regard to the wound on the plantar aspect of her foot medially. This is measuring smaller and looking much better than last time I saw her. Again when I did see her last was 2 weeks back and the wound was significantly larger. I do believe the cast is helping and I believe the collagen is a good option for her. Electronic Signature(s) Signed: 05/29/2020 9:37:32 AM By: Worthy Keeler PA-C Entered By: Worthy Keeler on 05/29/2020 09:37:31 -------------------------------------------------------------------------------- Physical Exam Details Patient Name: Date of Service: [name] [name], [name] [name] RBA RA E. 05/29/2020 8:00 A M Medical Record Number: [id number] Patient Account Number: [id number] Date of Birth/Sex: Treating RN: Feb 02, 1946 (74 y.o. [name]:  [name] Other Clinician: Referring Provider: Treating Provider/Extender: Adele Schilder in Treatment: 23 Constitutional Well-nourished and well-hydrated in no acute distress. Respiratory normal breathing without difficulty. Psychiatric this patient is able to make decisions and demonstrates good insight into disease process. Alert and Oriented x 3. pleasant and cooperative. Notes Upon inspection patient's wound appears to be improved there was some need for sharp debridement to remove some of the callus around the edges of the wound as well as to clear away some of the slough on the surface of the wound. All of this was tolerated today without pain or complication the patient tolerated this in an excellent fashion and overall I am  extremely pleased with where things stand today. Electronic Signature(s) Signed: 05/29/2020 9:37:54 AM By: Worthy Keeler PA-C Entered By: Worthy Keeler on 05/29/2020 09:37:54 -------------------------------------------------------------------------------- Physician Orders Details Patient Name: Date of Service: Samad, Sequoyah RBA RA E. 05/29/2020 8:00 A M Medical Record Number: [id number] Patient Account Number: [id number] Date of Birth/Sex: Treating RN: February 18, 1946 (74 y.o. [name] Other Clinician: Referring Provider: Treating Provider/Extender: Adele Schilder in Treatment: 72 Verbal / Phone Orders: No Diagnosis Coding ICD-10 Coding Code Description E11.621 Type 2 diabetes mellitus with foot ulcer L97.522 Non-pressure chronic ulcer of other part of left foot with fat layer exposed M14.672 Charcot's joint, left ankle and foot E11.40 Type 2 diabetes mellitus with diabetic neuropathy, unspecified I10 Essential (primary) hypertension Follow-up Appointments Return Appointment in 1 week. Dressing Change Frequency Wound #1 Left,Medial,Plantar Foot Do not change  entire dressing for one week. Skin Barriers/Peri-Wound Care Moisturizing lotion - to both legs Wound Cleansing Wound #1 Left,Medial,Plantar Foot May shower with protection. Primary Wound Dressing Wound #1 Left,Medial,Plantar Foot Silver Collagen - moisten with hydrogel Secondary Dressing Wound #1 Left,Medial,Plantar Foot Foam - donut Dry Gauze Drawtex - cut to fit inside wound to hold collagen in place Edema Control Avoid standing for long periods of time Elevate legs to the level of the heart or above for 30 minutes daily and/or when sitting, a frequency of: Exercise regularly Support Garment 20-30 mm/Hg pressure to: - juxtalite compression garment to right leg daily Off-Loading Total Contact Cast to Left Lower Extremity Electronic Signature(s) Signed: 05/29/2020 4:35:17 PM By: Worthy Keeler PA-C Signed: 05/29/2020 4:55:29 PM By: [name] RN, BSN Entered By: [name] on 05/29/2020 09:08:11 -------------------------------------------------------------------------------- Problem List Details Patient Name: Date of Service: [name] [name] RBA RA E. 05/29/2020 8:00 A M Medical Record Number: [id number] Patient Account Number: [id number] Date of Birth/Sex: Treating RN: [birth year] (74 y.o. [name] Other Clinician: Referring Provider: Treating Provider/Extender: Adele Schilder in Treatment: 19 Active Problems ICD-10 Encounter Code Description Active Date MDM Diagnosis E11.621 Type 2 diabetes mellitus with foot ulcer 01/17/2020 No Yes L97.522 Non-pressure chronic ulcer of other part of left foot with fat layer exposed 01/17/2020 No Yes M14.672 Charcot's joint, left ankle and foot 01/17/2020 No Yes E11.40 Type 2 diabetes mellitus with diabetic neuropathy, unspecified 01/17/2020 No Yes I10 Essential (primary) hypertension 01/17/2020 No Yes Inactive Problems Resolved Problems Electronic Signature(s) Signed:  05/29/2020 4:35:17 PM By: Worthy Keeler PA-C Entered By: Worthy Keeler on 05/29/2020 08:31:44 -------------------------------------------------------------------------------- Progress Note Details Patient Name: Date of Service: [name] [name], [name] [name] RBA RA E. 05/29/2020 8:00 A M Medical Record Number: [id number] Patient Account Number: [id number] Date of Birth/Sex: Treating RN: 09-Sep-1945 (74 y.o. [name] Other Clinician: Referring Provider: Treating Provider/Extender: Adele Schilder in Treatment: 19 Subjective Chief Complaint Information obtained from Patient Left foot ulcer History of Present Illness (HPI) 01/17/2020 upon evaluation today patient presents for initial evaluation here in our clinic concerning issues she has been having with a left medial/plantar foot ulcer. This is actually been an issue for her since October 2020. She has been seeing Dr. Doran Durand for quite some time during that course. Fortunately there is no signs of active infection at this time. Or least no mention of this to have seen in general. With that being said unfortunately I do see some signs of erythema noted today that  does have me concerned about the possibility of infection at this point in the surrounding area of the wound. There is also a warm to touch at the site which is somewhat concerning. Fortunately there is no evidence of systemic infection which is great news. The patient does have a history of diabetes mellitus type 2, Charcot foot which is what led to the wound, and hypertension. She notes that she was in a cast for some time with Dr. Doran Durand for about 8 weeks. During that time they were utilizing according to the patient silver nitrate along with a foam doughnut and then Coban to secure in place in the cast in place. With that being said I do not have the actual records to review we are going to try to get a hold of those unfortunately they  would not flow over into care everywhere I did look today. She has been seeing Dr. Doran Durand and his physician assistant Larkin Ina up until the end of May and apparently is still seeing them on a regular basis every 2 weeks roughly. She has also tried Iodosorb without effect here. 01/24/2020 upon evaluation today patient actually appears to be doing quite well with regard to her wounds. She has been tolerating the dressing changes without complication. Fortunately there is no signs of active infection spreading which is good news. Her culture did show signs of Staph aureus I did place her on Augmentin due to the erythema surrounding the wound. With that being said the wound does appear to be doing better she has her longer walking cast/boot and I think that is actually good for her for the time being. I am considering reinitiating total contact cast when she gets back from vacation but next week she will actually be out of town at [employer] she knows not to get in the water but she still obviously is planning to enjoy herself she is going to take it easy on her foot however. 02/07/2020 upon evaluation today patient appears to be doing fairly well in regard to her ulcer on her foot. Fortunately there is no signs of severe infection at this time which is great news and overall very pleased in that regard. With that being said I do think that she could still benefit from a total contact cast. Nonetheless she is using her walking boot which at least provide some protection and that it prevents some of the friction occurring when she is ambulating. 02/14/2020 upon evaluation today patient appears to be doing well with regard to her foot ulcer. This is actually measuring a little bit smaller yet again this week. Overall very pleased with where things stand and I do not see any signs of active infection at this time which is also good news. Since she is measuring better the patient has wanting to somewhat hold off on  proceeding with the total contact cast which I think is reasonable at this point. 02/28/2020 on evaluation today patient appears to be doing well in general in regard to her wound although she has a lot of callus buildup as compared to last time I saw her. This is can require sharp debridement today. I do believe she really needs the total contact cast as well which we have discussed previous. 7/23; patient comes in for a total contact cast change 03/06/2020 on evaluation today patient appears to be doing quite well with regard to her wounds. Fortunately the wound bed is measuring smaller and looking much better there is little callus  noted although there is some debridement necessary today. 03/13/2020 on evaluation today patient's wound actually appears to be doing excellent which is great news. With that being said unfortunately she is having some issues currently with her left leg where she does have cellulitis it appears. This may have come from an area that rubbed underneath the cast from last week that we noted we padded that area and it looks to be doing excellent at this point but nonetheless the leg was somewhat painful, swollen, and somewhat erythematous. She also had an elevated white blood cell count of 11.5 based on what I saw on looking at her records from the med center in [facility] from where she was seen yesterday. Unfortunately with [place] having a provider on vacation there was no one here in the clinic in the afternoon when she called therefore she went to the ER as advised. Subsequently they did not cut off the cast as they did not have anyone from orthopedics there to do so and subsequently also did not have the ability to do the Doppler for evaluation of DVT They recommended therefore given her dose of Eliquis as well as . Augmentin and sent her home to come see [place] today to have the cast taken off and then she is supposed to go back to have the study for DVT performed they are  following. 03/20/2020 upon evaluation today patient appears to be doing well with regard to her foot all things considered we have not been able to use the total contact cast due to the infection that she had last week. She has been on the doxycycline and she had a 10-day supply of that I do believe that is helping and her leg appears to be doing better. With that being said there is fortunately no signs of active infection systemically at this time which is good news. No fevers, chills, nausea, vomiting, or diarrhea. 03/27/2020 upon evaluation today patient appears to be doing well with regard to her foot ulcer. There does not appear to be signs of active infection which is great news. Overall I am very pleased with where things stand at this point. 04/03/2020 upon evaluation today patient appears to be doing pretty well in regard to the overall appearance of her wound. Fortunately there is no signs of active infection at this time which is great news. No fevers, chills, nausea, vomiting, or diarrhea. With that being said she does have some blue-green drainage that actually is a little bit concerning to me for the possibility of Pseudomonas. I discussed that with the patient today. With that being said I do believe that we may be able to manage this however with the topical antibiotic cream as opposed to having to do anything oral especially since she seems to be doing so well with overall appearance of the wound. 04/10/2020 on evaluation today patient appears to be doing about the same roughly in regard to the overall size of her wound. With that being said she fortunately has not shown any signs of worsening overall which is good news. I do believe that she is doing a great job trying to offload but again she may still do better with the cast. I do not see in the blue-green drainage that we noticed previously I do believe the gentamicin help in this regard. 04/17/2020 on evaluation today patient's wound  appears to be doing about the same at this point. There is no significant improvement at this point. No fever chills  noted. She is up for put the cast back on the day. That she states in a couple weeks she will need to have this off to go to a workshop. 04/24/2020 on evaluation today patient appears to be doing significantly better in regard to her wound. Fortunately there is no signs of active infection and overall feel like she is making great progress the cast seems to have done excellent for her. 05/01/2020 upon evaluation today patient presents for reevaluation she really does not appear to be doing too badly in regard to the actual wound on the left foot we have been managing. Unfortunately she has bilateral lower extremity edema with blisters between the webspace of her first and second toe on both feet. She has a tremendous amount of edema in the legs which I think is where this is coming from it does not appear to be infected but nonetheless I do believe this is can be something that needs to be addressed today. Obviously this means we probably will not be putting the cast on at this point. She attributes this to the fact that she was sitting with her feet on the floor much longer during a conference last week she had a great time but unfortunately had a lot of complications as a result. 05/08/2020 upon evaluation today patient appears to be doing somewhat better in regard to her wounds at this time. Fortunately there is no signs of active infection which is great news. With that being said I do believe that the blisters have ruptured and unfortunately did not just reattach I will remove some of the blistered tissue today. With that being said I do think the wound itself on the plantar aspect of left foot does need to have sharp debridement. 05/15/2020 upon evaluation today patient appears to be doing about the same in regard to her foot ulcer. Unfortunately in the past week her husband had a  fall where he sustained a mild traumatic brain bleed. Fortunately he is doing better but being that he was in the hospital she had a walk on this a lot more. The wound does not appear to be any better is also not really appearing to be significantly worse which is good news. There is no signs of active infection at this time. 10/14; patient with a small diabetic wound on the medial part of her left foot. We have been using silver collagen a total contact cast making good progress. I think the patient had a series of blisters on her dorsal foot probably secondary to having her legs recumbent for 3 days while in a conference in [place]. We wrapped her leg last week these are all healed. We did not previously have her in compression on the right leg. 05/29/2020 upon evaluation today patient appears to be doing well with regard to the wound on the plantar aspect of her foot medially. This is measuring smaller and looking much better than last time I saw her. Again when I did see her last was 2 weeks back and the wound was significantly larger. I do believe the cast is helping and I believe the collagen is a good option for her. Objective Constitutional Well-nourished and well-hydrated in no acute distress. Vitals Time Taken: 8:10 AM, Height: 66 in, Weight: 245 lbs, BMI: 39.5, Temperature: 98.5 F, Pulse: 80 bpm, Respiratory Rate: 18 breaths/min, Blood Pressure: 143/82 mmHg, Capillary Blood Glucose: 118 mg/dl. General Notes: glucose per pt report Respiratory normal breathing without difficulty. Psychiatric this patient is  able to make decisions and demonstrates good insight into disease process. Alert and Oriented x 3. pleasant and cooperative. General Notes: Upon inspection patient's wound appears to be improved there was some need for sharp debridement to remove some of the callus around the edges of the wound as well as to clear away some of the slough on the surface of the wound. All of this was  tolerated today without pain or complication the patient tolerated this in an excellent fashion and overall I am extremely pleased with where things stand today. Integumentary (Hair, Skin) Wound #1 status is Open. Original cause of wound was Gradually Appeared. The wound is located on the [place]. The wound measures 0.4cm length x 0.4cm width x 0.2cm depth; 0.126cm^2 area and 0.025cm^3 volume. There is Fat Layer (Subcutaneous Tissue) exposed. There is no tunneling or undermining noted. There is a small amount of serous drainage noted. The wound margin is well defined and not attached to the wound base. There is large (67- 100%) pink granulation within the wound bed. There is no necrotic tissue within the wound bed. Assessment Active Problems ICD-10 Type 2 diabetes mellitus with foot ulcer Non-pressure chronic ulcer of other part of left foot with fat layer exposed Charcot's joint, left ankle and foot Type 2 diabetes mellitus with diabetic neuropathy, unspecified Essential (primary) hypertension Procedures Wound #1 Pre-procedure diagnosis of Wound #1 is a Diabetic Wound/Ulcer of the Lower Extremity located on the Left,Medial,Plantar Foot .Severity of Tissue Pre Debridement is: Fat layer exposed. There was a Excisional Skin/Subcutaneous Tissue Debridement with a total area of 0.16 sq cm performed by Worthy Keeler, PA. With the following instrument(s): Curette to remove Viable and Non-Viable tissue/material. Material removed includes Callus, Subcutaneous Tissue, and Slough after achieving pain control using Lidocaine 4% Topical Solution. No specimens were taken. A time out was conducted at 09:05, prior to the start of the procedure. A Minimum amount of bleeding was controlled with Pressure. The procedure was tolerated well with a pain level of 0 throughout and a pain level of 0 following the procedure. Post Debridement Measurements: 0.4cm length x 0.4cm width x 0.2cm depth;  0.025cm^3 volume. Character of Wound/Ulcer Post Debridement is improved. Severity of Tissue Post Debridement is: Fat layer exposed. Post procedure Diagnosis Wound #1: Same as Pre-Procedure Pre-procedure diagnosis of Wound #1 is a Diabetic Wound/Ulcer of the Lower Extremity located on the Left,Medial,Plantar Foot . There was a T Contact otal Cast Procedure by Worthy Keeler, PA. Post procedure Diagnosis Wound #1: Same as Pre-Procedure Plan Follow-up Appointments: Return Appointment in 1 week. Dressing Change Frequency: Wound #1 Left,Medial,Plantar Foot: Do not change entire dressing for one week. Skin Barriers/Peri-Wound Care: Moisturizing lotion - to both legs Wound Cleansing: Wound #1 Left,Medial,Plantar Foot: May shower with protection. Primary Wound Dressing: Wound #1 Left,Medial,Plantar Foot: Silver Collagen - moisten with hydrogel Secondary Dressing: Wound #1 Left,Medial,Plantar Foot: Foam - donut Dry Gauze Drawtex - cut to fit inside wound to hold collagen in place Edema Control: Avoid standing for long periods of time Elevate legs to the level of the heart or above for 30 minutes daily and/or when sitting, a frequency of: Exercise regularly Support Garment 20-30 mm/Hg pressure to: - juxtalite compression garment to right leg daily Off-Loading: T Contact Cast to Left Lower Extremity otal 1. I would recommend currently that we going to continue with the wound care measures as before and the patient is in agreement with that plan. This includes the use of the silver  collagen dressing along with a small piece of drawtex behind to hold this in place. 2. I am also can recommend we continue with a total contact cast that does seem to be beneficial for her. 3. I also recommend the patient continue to limit her walking obviously I think with the cast she can do some but she should just not overdo things. We will see patient back for reevaluation in 1 week here in the clinic. If  anything worsens or changes patient will contact our office for additional recommendations. Electronic Signature(s) Signed: 05/29/2020 9:38:32 AM By: Worthy Keeler PA-C Entered By: Worthy Keeler on 05/29/2020 09:38:31 -------------------------------------------------------------------------------- Total Contact Cast Details Patient Name: Date of Service: [name] [name], [name] [name] 05/29/2020 8:00 A M Medical Record Number: [id number] Patient Account Number: [id number] Date of Birth/Sex: Treating RN: May 17, 1946 (74 y.o. [name] Other Clinician: Referring Provider: Treating Provider/Extender: Adele Schilder in Treatment: [phone] T Contact Cast Applied for Wound Assessment: otal Wound #1 Left,Medial,Plantar Foot Performed By: Physician Worthy Keeler, PA Post Procedure Diagnosis Same as Pre-procedure Electronic Signature(s) Signed: 05/29/2020 4:35:17 PM By: Worthy Keeler PA-C Signed: 05/29/2020 4:55:29 PM By: [name] RN, BSN Entered By: [name] on 05/29/2020 09:03:36 -------------------------------------------------------------------------------- SuperBill Details Patient Name: Date of Service: [name] [name] RBA RA E. 05/29/2020 Medical Record Number: [id number] Patient Account Number: [id number] Date of Birth/Sex: Treating RN: [birth year] (74 y.o. [name] Other Clinician: Referring Provider: Treating Provider/Extender: Adele Schilder in Treatment: 19 Diagnosis Coding ICD-10 Codes Code Description E11.621 Type 2 diabetes mellitus with foot ulcer L97.522 Non-pressure chronic ulcer of other part of left foot with fat layer exposed M14.672 Charcot's joint, left ankle and foot E11.40 Type 2 diabetes mellitus with diabetic neuropathy, unspecified I10 Essential (primary) hypertension Facility Procedures CPT4 Code: [id number] Description: 61443 -  DEB SUBQ TISSUE 20 SQ CM/< ICD-10 Diagnosis Description L97.522 Non-pressure chronic ulcer of other part of left foot with fat layer exposed Modifier: Quantity: 1 Physician Procedures : CPT4 Code Description Modifier [id number] - WC PHYS SUBQ TISS 20 SQ CM ICD-10 Diagnosis Description L97.522 Non-pressure chronic ulcer of other part of left foot with fat layer exposed Quantity: 1 Electronic Signature(s) Signed: 05/29/2020 9:38:59 AM By: Worthy Keeler PA-C Entered By: Worthy Keeler on 05/29/2020 09:38:57

## 2020-05-30 NOTE — Progress Notes (Signed)
[name] [name], [name] [name] ([id number]) Visit Report for 05/29/2020 Arrival Information Details Patient Name: Date of Service: [name] [name], [name] [name] 05/29/2020 8:00 A M Medical Record Number: [id number] Patient Account Number: [id number] Date of Birth/Sex: Treating RN: 1946/03/20 (74 y.o. [name] Primary Care Mara Favero: [name] Other Clinician: Referring Loye Reininger: Treating Naomi Fitton/Extender: Adele Schilder in Treatment: 19 Visit Information History Since Last Visit Added or deleted any medications: No Patient Arrived: Wheel Chair Any new allergies or adverse reactions: No Arrival Time: 08:10 Had a fall or experienced change in No Accompanied By: alone activities of daily living that [name] [name] affect Transfer Assistance: None risk of falls: Patient Identification Verified: Yes Signs or symptoms of abuse/neglect since last visito No Secondary Verification Process Completed: Yes Hospitalized since last visit: No Patient Requires Transmission-Based Precautions: No Implantable device outside of the clinic excluding No Patient Has Alerts: No cellular tissue based products placed in the center since last visit: Has Dressing in Place as Prescribed: Yes Has Footwear/Offloading in Place as Prescribed: Yes Left: T Contact Cast otal Pain Present Now: No Electronic Signature(s) Signed: 05/30/2020 5:24:31 PM By: Levan Hurst RN, BSN Entered By: Levan Hurst on 05/29/2020 08:10:48 -------------------------------------------------------------------------------- Encounter Discharge Information Details Patient Name: Date of Service: [name] [name], [name] [name] RBA RA E. 05/29/2020 8:00 A M Medical Record Number: [id number] Patient Account Number: [id number] Date of Birth/Sex: Treating RN: Oct 21, 1945 (74 y.o. [name] Primary Care Dianah Pruett: [name] Other Clinician: Referring Dyonna Jaspers: Treating Latoria Dry/Extender: Adele Schilder in Treatment: 19 Encounter  Discharge Information Items Post Procedure Vitals Discharge Condition: Stable Temperature (F): 98.5 Ambulatory Status: Wheelchair Pulse (bpm): 80 Discharge Destination: Home Respiratory Rate (breaths/min): 18 Transportation: Private Auto Blood Pressure (mmHg): 143/82 Accompanied By: self Schedule Follow-up Appointment: Yes Clinical Summary of Care: Patient Declined Electronic Signature(s) Signed: 05/29/2020 4:37:14 PM By: Kela Millin Entered By: Kela Millin on 05/29/2020 09:14:00 -------------------------------------------------------------------------------- Lower Extremity Assessment Details Patient Name: Date of Service: [name] Amato RA E. 05/29/2020 8:00 A M Medical Record Number: [id number] Patient Account Number: [id number] Date of Birth/Sex: Treating RN: [birth year] (74 y.o. [name] Primary Care Chenise Mulvihill: [name] Other Clinician: Referring Devaughn Savant: Treating Coreon Simkins/Extender: Landis Martins Weeks in Treatment: 19 Edema Assessment Assessed: [Left: No] [Right: No] Edema: [Left: Ye] [Right: s] Calf Left: Right: Point of Measurement: 35 cm From Medial Instep 34.5 cm Ankle Left: Right: Point of Measurement: 9 cm From Medial Instep 22.6 cm Vascular Assessment Pulses: Dorsalis Pedis Palpable: [Left:Yes] Electronic Signature(s) Signed: 05/30/2020 5:24:31 PM By: Levan Hurst RN, BSN Entered By: Levan Hurst on 05/29/2020 08:22:43 -------------------------------------------------------------------------------- [place] Details Patient Name: Date of Service: [name] Amato RA E. 05/29/2020 8:00 A M Medical Record Number: [id number] Patient Account Number: [id number] Date of Birth/Sex: Treating RN: 08/25/1945 (74 y.o. [name] Primary Care Shyasia Funches: [name] Other Clinician: Referring Ovella Manygoats: Treating Emya Picado/Extender: Adele Schilder in Treatment: 19 Active  Inactive Nutrition Nursing Diagnoses: Impaired glucose control: actual or potential Potential for alteratiion in Nutrition/Potential for imbalanced nutrition Goals: Patient/caregiver verbalizes understanding of need to maintain therapeutic glucose control per primary care physician Date Initiated: 01/17/2020 Date Inactivated: 02/14/2020 Target Resolution Date: 02/14/2020 Goal Status: Met Patient/caregiver will maintain therapeutic glucose control Date Initiated: 01/17/2020 Target Resolution Date: 06/05/2020 Goal Status: Active Interventions: Assess HgA1c results as ordered upon admission and as needed Assess patient nutrition upon admission and as needed per policy Provide education on elevated blood sugars and impact on  wound healing Treatment Activities: Patient referred to Primary Care Physician for further nutritional evaluation : 01/17/2020 Notes: Wound/Skin Impairment Nursing Diagnoses: Impaired tissue integrity Knowledge deficit related to ulceration/compromised skin integrity Goals: Patient/caregiver will verbalize understanding of skin care regimen Date Initiated: 01/17/2020 Target Resolution Date: 06/05/2020 Goal Status: Active Ulcer/skin breakdown will have a volume reduction of 30% by week 4 Date Initiated: 01/17/2020 Date Inactivated: 02/14/2020 Target Resolution Date: 02/14/2020 Goal Status: Met Ulcer/skin breakdown will have a volume reduction of 50% by week 8 Date Initiated: 02/14/2020 Date Inactivated: 03/13/2020 Target Resolution Date: 03/13/2020 Goal Status: Met Ulcer/skin breakdown will have a volume reduction of 80% by week 12 Date Initiated: 03/13/2020 Date Inactivated: 04/10/2020 Target Resolution Date: 04/10/2020 Goal Status: Unmet Unmet Reason: infection Interventions: Assess patient/caregiver ability to obtain necessary supplies Assess patient/caregiver ability to perform ulcer/skin care regimen upon admission and as needed Assess ulceration(s) every visit Provide  education on ulcer and skin care Treatment Activities: Skin care regimen initiated : 01/17/2020 Topical wound management initiated : 01/17/2020 Notes: Electronic Signature(s) Signed: 05/29/2020 4:55:29 PM By: [name] RN, BSN Entered By: [name] on 05/29/2020 08:10:07 -------------------------------------------------------------------------------- Pain Assessment Details Patient Name: Date of Service: [name] Amato RA E. 05/29/2020 8:00 A M Medical Record Number: [id number] Patient Account Number: [id number] Date of Birth/Sex: Treating RN: [birth year] (74 y.o. [name] Primary Care Kilie Rund: [name] Other Clinician: Referring Trevor Duty: Treating Esten Dollar/Extender: Adele Schilder in Treatment: 19 Active Problems Location of Pain Severity and Description of Pain Patient Has Paino No Site Locations Pain Management and Medication Current Pain Management: Electronic Signature(s) Signed: 05/30/2020 5:24:31 PM By: Levan Hurst RN, BSN Entered By: Levan Hurst on 05/29/2020 08:11:22 -------------------------------------------------------------------------------- Patient/Caregiver Education Details Patient Name: Date of Service: [name] Amato RA E. 10/20/2021andnbsp8:00 A M Medical Record Number: [id number] Patient Account Number: [id number] Date of Birth/Gender: Treating RN: 07-Sep-1945 (74 y.o. [name] Primary Care Physician: [name] Other Clinician: Referring Physician: Treating Physician/Extender: Adele Schilder in Treatment: 22 Education Assessment Education Provided To: Patient Education Topics Provided Offloading: Methods: Explain/Verbal Responses: Reinforcements needed, State content correctly Venous: Methods: Explain/Verbal Responses: Reinforcements needed, State content correctly Wound/Skin Impairment: Methods: Explain/Verbal Responses: Reinforcements needed, State content  correctly Electronic Signature(s) Signed: 05/29/2020 4:55:29 PM By: [name] RN, BSN Entered By: [name] on 05/29/2020 08:10:43 -------------------------------------------------------------------------------- Wound Assessment Details Patient Name: Date of Service: [name] [name], [name] RA E. 05/29/2020 8:00 A M Medical Record Number: [id number] Patient Account Number: [id number] Date of Birth/Sex: Treating RN: 18-Mar-1946 (74 y.o. [name] Primary Care Raymar Joiner: [name] Other Clinician: Referring Aleiya Rye: Treating Harmoni Lucus/Extender: Landis Martins Weeks in Treatment: 19 Wound Status Wound Number: 1 Primary Diabetic Wound/Ulcer of the Lower Extremity Etiology: Wound Location: Left, Medial, Plantar Foot Wound Open Wounding Event: Gradually Appeared Status: Date Acquired: 05/11/2019 Comorbid Sleep Apnea, Deep Vein Thrombosis, Hypertension, Colitis, Type Weeks Of Treatment: 19 History: II Diabetes, Gout, Osteoarthritis, Neuropathy Clustered Wound: No Wound Measurements Length: (cm) 0.4 Width: (cm) 0.4 Depth: (cm) 0.2 Area: (cm) 0.126 Volume: (cm) 0.025 % Reduction in Area: 87.7% % Reduction in Volume: 93.9% Epithelialization: Large (67-100%) Tunneling: No Undermining: No Wound Description Classification: Grade 2 Wound Margin: Well defined, not attached Exudate Amount: Small Exudate Type: Serous Exudate Color: amber Foul Odor After Cleansing: No Slough/Fibrino No Wound Bed Granulation Amount: Large (67-100%) Exposed Structure Granulation Quality: Pink Fascia Exposed: No Necrotic Amount: None Present (0%) Fat Layer (Subcutaneous Tissue) Exposed: Yes Tendon Exposed:  No Muscle Exposed: No Joint Exposed: No Bone Exposed: No Treatment Notes Wound #1 (Left, Medial, Plantar Foot) 1. Cleanse With Wound Cleanser Soap and water 2. Periwound Care Moisturizing lotion 3. Primary Dressing Applied [employer] Foam 5. Secured  With Tape 7. Footwear/Offloading device applied T Contact Cast otal Notes Last layer of TCC applied by MD. Electronic Signature(s) Signed: 05/30/2020 5:24:31 PM By: Levan Hurst RN, BSN Entered By: Levan Hurst on 05/29/2020 08:22:22 -------------------------------------------------------------------------------- Vitals Details Patient Name: Date of Service: [name] [name], [name] Creek RBA RA E. 05/29/2020 8:00 A M Medical Record Number: [id number] Patient Account Number: [id number] Date of Birth/Sex: Treating RN: [birth year] (74 y.o. [name] Primary Care Deriona Altemose: [name] Other Clinician: Referring Lino Wickliff: Treating Shameek Nyquist/Extender: Adele Schilder in Treatment: 19 Vital Signs Time Taken: 08:10 Temperature (F): 98.5 Height (in): 66 Pulse (bpm): 80 Weight (lbs): 245 Respiratory Rate (breaths/min): 18 Body Mass Index (BMI): 39.5 Blood Pressure (mmHg): 143/82 Capillary Blood Glucose (mg/dl): 118 Reference Range: 80 - 120 mg / dl Notes glucose per pt report Electronic Signature(s) Signed: 05/30/2020 5:24:31 PM By: Levan Hurst RN, BSN Entered By: Levan Hurst on 05/29/2020 08:11:17

## 2020-06-05 ENCOUNTER — Other Ambulatory Visit: Payer: Self-pay

## 2020-06-05 ENCOUNTER — Encounter (HOSPITAL_BASED_OUTPATIENT_CLINIC_OR_DEPARTMENT_OTHER): Payer: Medicare Other | Admitting: Physician Assistant

## 2020-06-05 DIAGNOSIS — L97522 Non-pressure chronic ulcer of other part of left foot with fat layer exposed: Secondary | ICD-10-CM | POA: Diagnosis not present

## 2020-06-05 DIAGNOSIS — E11610 Type 2 diabetes mellitus with diabetic neuropathic arthropathy: Secondary | ICD-10-CM | POA: Diagnosis not present

## 2020-06-05 DIAGNOSIS — E1140 Type 2 diabetes mellitus with diabetic neuropathy, unspecified: Secondary | ICD-10-CM | POA: Diagnosis not present

## 2020-06-05 DIAGNOSIS — E11621 Type 2 diabetes mellitus with foot ulcer: Secondary | ICD-10-CM | POA: Diagnosis not present

## 2020-06-05 NOTE — Progress Notes (Signed)
MONZERAT, HANDLER ([id number]) Visit Report for 06/05/2020 Arrival Information Details Patient Name: Date of Service: JODY, AGUINAGA RA E. 06/05/2020 12:30 PM Medical Record Number: [id number] Patient Account Number: [id number] Date of Birth/Sex: Treating RN: 12/19/1945 (74 y.o. [name] Primary Care Amaree Leeper: [name] Other Clinician: Referring France Lusty: Treating Quinley Nesler/Extender: Adele Schilder in Treatment: 20 Visit Information History Since Last Visit Added or deleted any medications: No Patient Arrived: Wheel Chair Any new allergies or adverse reactions: No Arrival Time: 12:50 Had a fall or experienced change in No Accompanied By: alone activities of daily living that may affect Transfer Assistance: None risk of falls: Patient Identification Verified: Yes Signs or symptoms of abuse/neglect since last visito No Secondary Verification Process Completed: Yes Hospitalized since last visit: No Patient Requires Transmission-Based Precautions: No Implantable device outside of the clinic excluding No Patient Has Alerts: No cellular tissue based products placed in the center since last visit: Has Dressing in Place as Prescribed: Yes Has Footwear/Offloading in Place as Prescribed: Yes Left: T Contact Cast otal Pain Present Now: No Electronic Signature(s) Signed: 06/05/2020 5:38:30 PM By: Levan Hurst RN, BSN Entered By: Levan Hurst on 06/05/2020 12:50:41 -------------------------------------------------------------------------------- Encounter Discharge Information Details Patient Name: Date of Service: [name] 06/05/2020 12:30 PM Medical Record Number: [id number] Patient Account Number: [id number] Date of Birth/Sex: Treating RN: 1945/12/20 (74 y.o. [name] Primary Care Arienna Benegas: [name] Other Clinician: Referring Daysen Gundrum: Treating Aamir Mclinden/Extender: Adele Schilder in Treatment: 20 Encounter  Discharge Information Items Post Procedure Vitals Discharge Condition: Stable Temperature (F): 99.1 Ambulatory Status: Walker Pulse (bpm): 105 Discharge Destination: Home Respiratory Rate (breaths/min): 18 Transportation: Private Auto Blood Pressure (mmHg): 150/77 Accompanied By: self Schedule Follow-up Appointment: Yes Clinical Summary of Care: Patient Declined Electronic Signature(s) Signed: 06/05/2020 5:36:04 PM By: [name] RN, BSN Entered By: [name] on 06/05/2020 14:44:57 -------------------------------------------------------------------------------- Lower Extremity Assessment Details Patient Name: Date of Service: [name] 06/05/2020 12:30 PM Medical Record Number: [id number] Patient Account Number: [id number] Date of Birth/Sex: Treating RN: [birth year] (74 y.o. [name] Primary Care Remee Charley: [name] Other Clinician: Referring Lindamarie Maclachlan: Treating Kadrian Partch/Extender: Landis Martins Weeks in Treatment: 20 Edema Assessment Assessed: [Left: No] [Right: No] Edema: [Left: Ye] [Right: s] Calf Left: Right: Point of Measurement: 35 cm From Medial Instep 34.5 cm Ankle Left: Right: Point of Measurement: 9 cm From Medial Instep 22.6 cm Vascular Assessment Pulses: Dorsalis Pedis Palpable: [Left:Yes] Electronic Signature(s) Signed: 06/05/2020 5:38:30 PM By: Levan Hurst RN, BSN Entered By: Levan Hurst on 06/05/2020 13:02:46 -------------------------------------------------------------------------------- [place] Details Patient Name: Date of Service: [name] 06/05/2020 12:30 PM Medical Record Number: [id number] Patient Account Number: [id number] Date of Birth/Sex: Treating RN: December 07, 1945 (74 y.o. [name] Primary Care Eliabeth Shoff: [name] Other Clinician: Referring Ashlinn Hemrick: Treating Aiven Kampe/Extender: Adele Schilder in Treatment: 20 Active  Inactive Nutrition Nursing Diagnoses: Impaired glucose control: actual or potential Potential for alteratiion in Nutrition/Potential for imbalanced nutrition Goals: Patient/caregiver verbalizes understanding of need to maintain therapeutic glucose control per primary care physician Date Initiated: 01/17/2020 Date Inactivated: 02/14/2020 Target Resolution Date: 02/14/2020 Goal Status: Met Patient/caregiver will maintain therapeutic glucose control Date Initiated: 01/17/2020 Target Resolution Date: 07/03/2020 Goal Status: Active Interventions: Assess HgA1c results as ordered upon admission and as needed Assess patient nutrition upon admission and as needed per policy Provide education on elevated blood sugars and impact on wound healing  Treatment Activities: Patient referred to Primary Care Physician for further nutritional evaluation : 01/17/2020 Notes: Wound/Skin Impairment Nursing Diagnoses: Impaired tissue integrity Knowledge deficit related to ulceration/compromised skin integrity Goals: Patient/caregiver will verbalize understanding of skin care regimen Date Initiated: 01/17/2020 Target Resolution Date: 07/03/2020 Goal Status: Active Ulcer/skin breakdown will have a volume reduction of 30% by week 4 Date Initiated: 01/17/2020 Date Inactivated: 02/14/2020 Target Resolution Date: 02/14/2020 Goal Status: Met Ulcer/skin breakdown will have a volume reduction of 50% by week 8 Date Initiated: 02/14/2020 Date Inactivated: 03/13/2020 Target Resolution Date: 03/13/2020 Goal Status: Met Ulcer/skin breakdown will have a volume reduction of 80% by week 12 Date Initiated: 03/13/2020 Date Inactivated: 04/10/2020 Target Resolution Date: 04/10/2020 Goal Status: Unmet Unmet Reason: infection Interventions: Assess patient/caregiver ability to obtain necessary supplies Assess patient/caregiver ability to perform ulcer/skin care regimen upon admission and as needed Assess ulceration(s) every visit Provide  education on ulcer and skin care Treatment Activities: Skin care regimen initiated : 01/17/2020 Topical wound management initiated : 01/17/2020 Notes: Electronic Signature(s) Signed: 06/05/2020 5:36:04 PM By: Zenaida Deed RN, BSN Entered By: Zenaida Deed on 06/05/2020 13:12:10 -------------------------------------------------------------------------------- Pain Assessment Details Patient Name: Date of Service: Delton [place] RA E. 06/05/2020 12:30 PM Medical Record Number: [id number] Patient Account Number: [id number] Date of Birth/Sex: Treating RN: [birth year] (74 y.o. [name] Primary Care Astria Jordahl: Cheryll Cockayne Other Clinician: Referring Radford Pease: Treating Sayra Frisby/Extender: Rickard Patience in Treatment: 20 Active Problems Location of Pain Severity and Description of Pain Patient Has Paino No Site Locations Pain Management and Medication Current Pain Management: Electronic Signature(s) Signed: 06/05/2020 5:38:30 PM By: Zandra Abts RN, BSN Entered By: Zandra Abts on 06/05/2020 12:51:12 -------------------------------------------------------------------------------- Patient/Caregiver Education Details Patient Name: Date of Service: Delton [place] RA E. 10/27/2021andnbsp12:30 PM Medical Record Number: [id number] Patient Account Number: [id number] Date of Birth/Gender: Treating RN: April 14, 1946 (74 y.o. [name] Primary Care Physician: Cheryll Cockayne Other Clinician: Referring Physician: Treating Physician/Extender: Rickard Patience in Treatment: 20 Education Assessment Education Provided To: Patient Education Topics Provided Elevated Blood Sugar/ Impact on Healing: Methods: Explain/Verbal Responses: Reinforcements needed, State content correctly Offloading: Methods: Explain/Verbal Responses: Reinforcements needed, State content correctly Wound/Skin Impairment: Methods: Explain/Verbal Responses:  Reinforcements needed, State content correctly Electronic Signature(s) Signed: 06/05/2020 5:36:04 PM By: Zenaida Deed RN, BSN Entered By: Zenaida Deed on 06/05/2020 13:12:51 -------------------------------------------------------------------------------- Wound Assessment Details Patient Name: Date of Service: Delton [place] RA E. 06/05/2020 12:30 PM Medical Record Number: [id number] Patient Account Number: [id number] Date of Birth/Sex: Treating RN: 19-Feb-1946 (74 y.o. [name] Primary Care Sherrye Puga: Cheryll Cockayne Other Clinician: Referring Aliyha Fornes: Treating Oddie Kuhlmann/Extender: Atlee Abide Weeks in Treatment: 20 Wound Status Wound Number: 1 Primary Diabetic Wound/Ulcer of the Lower Extremity Etiology: Wound Location: Left, Medial, Plantar Foot Wound Open Wounding Event: Gradually Appeared Status: Date Acquired: 05/11/2019 Comorbid Sleep Apnea, Deep Vein Thrombosis, Hypertension, Colitis, Type Weeks Of Treatment: 20 History: II Diabetes, Gout, Osteoarthritis, Neuropathy Clustered Wound: No Wound Measurements Length: (cm) 0.2 Width: (cm) 0.3 Depth: (cm) 0.3 Area: (cm) 0.047 Volume: (cm) 0.014 % Reduction in Area: 95.4% % Reduction in Volume: 96.6% Epithelialization: Large (67-100%) Tunneling: No Undermining: No Wound Description Classification: Grade 2 Wound Margin: Well defined, not attached Exudate Amount: Small Exudate Type: Serous Exudate Color: amber Foul Odor After Cleansing: No Slough/Fibrino No Wound Bed Granulation Amount: Large (67-100%) Exposed Structure Granulation Quality: Pink Fascia Exposed: No Necrotic Amount: None Present (0%) Fat Layer (Subcutaneous Tissue) Exposed: Yes Tendon Exposed:  No Muscle Exposed: No Joint Exposed: No Bone Exposed: No Treatment Notes Wound #1 (Left, Medial, Plantar Foot) 3. Primary Dressing Applied Collegen AG 4. Secondary Dressing Dry Gauze Foam 5. Secured With Tape 7.  Footwear/Offloading device applied T Contact Cast otal Notes Last layer of TCC applied by MD. Electronic Signature(s) Signed: 06/05/2020 5:38:30 PM By: Levan Hurst RN, BSN Entered By: Levan Hurst on 06/05/2020 13:02:34 -------------------------------------------------------------------------------- Vitals Details Patient Name: Date of Service: [place], [place] RBA RA E. 06/05/2020 12:30 PM Medical Record Number: [id number] Patient Account Number: [id number] Date of Birth/Sex: Treating RN: April 30, 1946 (74 y.o. [name] Primary Care Nava Song: [name] Other Clinician: Referring Hollin Crewe: Treating Yitta Gongaware/Extender: Landis Martins Weeks in Treatment: 20 Vital Signs Time Taken: 12:50 Temperature (F): 99.1 Height (in): 66 Pulse (bpm): 105 Weight (lbs): 245 Respiratory Rate (breaths/min): 18 Body Mass Index (BMI): 39.5 Blood Pressure (mmHg): 150/77 Capillary Blood Glucose (mg/dl): 119 Reference Range: 80 - 120 mg / dl Notes glucose per pt report Electronic Signature(s) Signed: 06/05/2020 5:38:30 PM By: Levan Hurst RN, BSN Entered By: Levan Hurst on 06/05/2020 12:51:06

## 2020-06-05 NOTE — Progress Notes (Addendum)
[name], [name] ([id number]) Visit Report for 06/05/2020 Chief Complaint Document Details Patient Name: Date of Service: [name] [name], [name] [name] E. 06/05/2020 12:30 PM Medical Record Number: [id number] Patient Account Number: [id number] Date of Birth/Sex: Treating RN: 01-11-1946 (74 y.o. [name] Other Clinician: Referring Provider: Treating Provider/Extender: Adele Schilder in Treatment: 20 Information Obtained from: Patient Chief Complaint Left foot ulcer Electronic Signature(s) Signed: 06/05/2020 12:44:41 PM By: Worthy Keeler PA-C Entered By: Worthy Keeler on 06/05/2020 12:44:41 -------------------------------------------------------------------------------- Debridement Details Patient Name: Date of Service: [name] [name] E. 06/05/2020 12:30 PM Medical Record Number: [id number] Patient Account Number: [id number] Date of Birth/Sex: Treating RN: Dec 14, 1945 (74 y.o. [name] Other Clinician: Referring Provider: Treating Provider/Extender: Adele Schilder in Treatment: 20 Debridement Performed for Assessment: Wound #1 Left,Medial,Plantar Foot Performed By: Physician Worthy Keeler, PA Debridement Type: Debridement Severity of Tissue Pre Debridement: Fat layer exposed Level of Consciousness (Pre-procedure): Awake and Alert Pre-procedure Verification/Time Out Yes - 13:10 Taken: Start Time: 13:11 Pain Control: Lidocaine 4% T opical Solution T Area Debrided (L x W): otal 0.5 (cm) x 0.5 (cm) = 0.25 (cm) Tissue and other material debrided: Viable, Non-Viable, Callus, Slough, Subcutaneous, Slough Level: Skin/Subcutaneous Tissue Debridement Description: Excisional Instrument: Curette Bleeding: Minimum End Time: 13:13 Procedural Pain: 0 Post Procedural Pain: 0 Response to Treatment: Procedure was tolerated well Level of Consciousness (Post- Awake and  Alert procedure): Post Debridement Measurements of Total Wound Length: (cm) 0.2 Width: (cm) 0.3 Depth: (cm) 0.3 Volume: (cm) 0.014 Character of Wound/Ulcer Post Debridement: Improved Severity of Tissue Post Debridement: Fat layer exposed Post Procedure Diagnosis Same as Pre-procedure Electronic Signature(s) Signed: 06/05/2020 4:47:33 PM By: Worthy Keeler PA-C Signed: 06/05/2020 5:36:04 PM By: [name] RN, BSN Entered By: [name] on 06/05/2020 13:14:34 -------------------------------------------------------------------------------- HPI Details Patient Name: Date of Service: [name] [name], [name] [name] [name] [name] E. 06/05/2020 12:30 PM Medical Record Number: [id number] Patient Account Number: [id number] Date of Birth/Sex: Treating RN: 1945/09/09 (74 y.o. [name] Other Clinician: Referring Provider: Treating Provider/Extender: Adele Schilder in Treatment: 20 History of Present Illness HPI Description: 01/17/2020 upon evaluation today patient presents for initial evaluation here in our clinic concerning issues she has been having with a left medial/plantar foot ulcer. This is actually been an issue for her since October 2020. She has been seeing Dr. Doran Durand for quite some time during that course. Fortunately there is no signs of active infection at this time. Or least no mention of this to have seen in general. With that being said unfortunately I do see some signs of erythema noted today that does have me concerned about the possibility of infection at this point in the surrounding area of the wound. There is also a warm to touch at the site which is somewhat concerning. Fortunately there is no evidence of systemic infection which is great news. The patient does have a history of diabetes mellitus type 2, Charcot foot which is what led to the wound, and hypertension. She notes that she was in a cast for some time with Dr. Doran Durand  for about 8 weeks. During that time they were utilizing according to the patient silver nitrate along with a foam doughnut and then Coban to secure in place in the cast in place. With that being said I do not have the actual records to review we  are going to try to get a hold of those unfortunately they would not flow over into care everywhere I did look today. She has been seeing Dr. Doran Durand and his physician assistant Larkin Ina up until the end of May and apparently is still seeing them on a regular basis every 2 weeks roughly. She has also tried Iodosorb without effect here. 01/24/2020 upon evaluation today patient actually appears to be doing quite well with regard to her wounds. She has been tolerating the dressing changes without complication. Fortunately there is no signs of active infection spreading which is good news. Her culture did show signs of Staph aureus I did place her on Augmentin due to the erythema surrounding the wound. With that being said the wound does appear to be doing better she has her longer walking cast/boot and I think that is actually good for her for the time being. I am considering reinitiating total contact cast when she gets back from vacation but next week she will actually be out of town at [employer] she knows not to get in the water but she still obviously is planning to enjoy herself she is going to take it easy on her foot however. 02/07/2020 upon evaluation today patient appears to be doing fairly well in regard to her ulcer on her foot. Fortunately there is no signs of severe infection at this time which is great news and overall very pleased in that regard. With that being said I do think that she could still benefit from a total contact cast. Nonetheless she is using her walking boot which at least provide some protection and that it prevents some of the friction occurring when she is ambulating. 02/14/2020 upon evaluation today patient appears to be doing well with  regard to her foot ulcer. This is actually measuring a little bit smaller yet again this week. Overall very pleased with where things stand and I do not see any signs of active infection at this time which is also good news. Since she is measuring better the patient has wanting to somewhat hold off on proceeding with the total contact cast which I think is reasonable at this point. 02/28/2020 on evaluation today patient appears to be doing well in general in regard to her wound although she has a lot of callus buildup as compared to last time I saw her. This is can require sharp debridement today. I do believe she really needs the total contact cast as well which we have discussed previous. 7/23; patient comes in for a total contact cast change 03/06/2020 on evaluation today patient appears to be doing quite well with regard to her wounds. Fortunately the wound bed is measuring smaller and looking much better there is little callus noted although there is some debridement necessary today. 03/13/2020 on evaluation today patient's wound actually appears to be doing excellent which is great news. With that being said unfortunately she is having some issues currently with her left leg where she does have cellulitis it appears. This may have come from an area that rubbed underneath the cast from last week that we noted we padded that area and it looks to be doing excellent at this point but nonetheless the leg was somewhat painful, swollen, and somewhat erythematous. She also had an elevated white blood cell count of 11.5 based on what I saw on looking at her records from the med center in [facility] from where she was seen yesterday. Unfortunately with [place] having a provider  on vacation there was no one here in the clinic in the afternoon when she called therefore she went to the ER as advised. Subsequently they did not cut off the cast as they did not have anyone from orthopedics there to do so and subsequently  also did not have the ability to do the Doppler for evaluation of DVT They recommended therefore given her dose of Eliquis as well as . Augmentin and sent her home to come see [place] today to have the cast taken off and then she is supposed to go back to have the study for DVT performed they are following. 03/20/2020 upon evaluation today patient appears to be doing well with regard to her foot all things considered we have not been able to use the total contact cast due to the infection that she had last week. She has been on the doxycycline and she had a 10-day supply of that I do believe that is helping and her leg appears to be doing better. With that being said there is fortunately no signs of active infection systemically at this time which is good news. No fevers, chills, nausea, vomiting, or diarrhea. 03/27/2020 upon evaluation today patient appears to be doing well with regard to her foot ulcer. There does not appear to be signs of active infection which is great news. Overall I am very pleased with where things stand at this point. 04/03/2020 upon evaluation today patient appears to be doing pretty well in regard to the overall appearance of her wound. Fortunately there is no signs of active infection at this time which is great news. No fevers, chills, nausea, vomiting, or diarrhea. With that being said she does have some blue-green drainage that actually is a little bit concerning to me for the possibility of Pseudomonas. I discussed that with the patient today. With that being said I do believe that we may be able to manage this however with the topical antibiotic cream as opposed to having to do anything oral especially since she seems to be doing so well with overall appearance of the wound. 04/10/2020 on evaluation today patient appears to be doing about the same roughly in regard to the overall size of her wound. With that being said she fortunately has not shown any signs of worsening  overall which is good news. I do believe that she is doing a great job trying to offload but again she may still do better with the cast. I do not see in the blue-green drainage that we noticed previously I do believe the gentamicin help in this regard. 04/17/2020 on evaluation today patient's wound appears to be doing about the same at this point. There is no significant improvement at this point. No fever chills noted. She is up for put the cast back on the day. That she states in a couple weeks she will need to have this off to go to a workshop. 04/24/2020 on evaluation today patient appears to be doing significantly better in regard to her wound. Fortunately there is no signs of active infection and overall feel like she is making great progress the cast seems to have done excellent for her. 05/01/2020 upon evaluation today patient presents for reevaluation she really does not appear to be doing too badly in regard to the actual wound on the left foot we have been managing. Unfortunately she has bilateral lower extremity edema with blisters between the webspace of her first and second toe on both feet. She has a  tremendous amount of edema in the legs which I think is where this is coming from it does not appear to be infected but nonetheless I do believe this is can be something that needs to be addressed today. Obviously this means we probably will not be putting the cast on at this point. She attributes this to the fact that she was sitting with her feet on the floor much longer during a conference last week she had a great time but unfortunately had a lot of complications as a result. 05/08/2020 upon evaluation today patient appears to be doing somewhat better in regard to her wounds at this time. Fortunately there is no signs of active infection which is great news. With that being said I do believe that the blisters have ruptured and unfortunately did not just reattach I will remove some of  the blistered tissue today. With that being said I do think the wound itself on the plantar aspect of left foot does need to have sharp debridement. 05/15/2020 upon evaluation today patient appears to be doing about the same in regard to her foot ulcer. Unfortunately in the past week her husband had a fall where he sustained a mild traumatic brain bleed. Fortunately he is doing better but being that he was in the hospital she had a walk on this a lot more. The wound does not appear to be any better is also not really appearing to be significantly worse which is good news. There is no signs of active infection at this time. 10/14; patient with a small diabetic wound on the medial part of her left foot. We have been using silver collagen a total contact cast making good progress. I think the patient had a series of blisters on her dorsal foot probably secondary to having her legs recumbent for 3 days while in a conference in [place]. We wrapped her leg last week these are all healed. We did not previously have her in compression on the right leg. 05/29/2020 upon evaluation today patient appears to be doing well with regard to the wound on the plantar aspect of her foot medially. This is measuring smaller and looking much better than last time I saw her. Again when I did see her last was 2 weeks back and the wound was significantly larger. I do believe the cast is helping and I believe the collagen is a good option for her. 06/05/2020 on evaluation today patient appears to be doing well with regard to her foot ulcer this is actually measuring significantly better and overall I feel like she is doing excellent. There is no signs of active infection at this time. Electronic Signature(s) Signed: 06/05/2020 1:55:27 PM By: Worthy Keeler PA-C Entered By: Worthy Keeler on 06/05/2020 13:55:26 -------------------------------------------------------------------------------- Physical Exam Details Patient  Name: Date of Service: [name] [name], [name] [name] [name] E. 06/05/2020 12:30 PM Medical Record Number: [id number] Patient Account Number: [id number] Date of Birth/Sex: Treating RN: August 19, 1945 (74 y.o. [name] Other Clinician: Referring Provider: Treating Provider/Extender: Landis Martins Weeks in Treatment: 15 Constitutional Well-nourished and well-hydrated in no acute distress. Respiratory normal breathing without difficulty. Psychiatric this patient is able to make decisions and demonstrates good insight into disease process. Alert and Oriented x 3. pleasant and cooperative. Notes Patient's cast seems to be doing excellent job keeping pressure off of the wound location to allow this to heal and in general I feel like she is doing extremely well.  There is no signs of active infection at this time systemically which is great news and overall I think that we are headed in the appropriate direction. Sharp debridement was performed to clear away some of the necrotic debris in the surface of the wound there was really no significant callus to remove. Electronic Signature(s) Signed: 06/05/2020 1:55:54 PM By: Worthy Keeler PA-C Entered By: Worthy Keeler on 06/05/2020 13:55:53 -------------------------------------------------------------------------------- Physician Orders Details Patient Name: Date of Service: Auer, [place] [name] [name] E. 06/05/2020 12:30 PM Medical Record Number: [id number] Patient Account Number: [id number] Date of Birth/Sex: Treating RN: [birth year] (74 y.o. [name] Other Clinician: Referring Provider: Treating Provider/Extender: Adele Schilder in Treatment: 20 Verbal / Phone Orders: No Diagnosis Coding ICD-10 Coding Code Description E11.621 Type 2 diabetes mellitus with foot ulcer L97.522 Non-pressure chronic ulcer of other part of left foot with fat layer  exposed M14.672 Charcot's joint, left ankle and foot E11.40 Type 2 diabetes mellitus with diabetic neuropathy, unspecified I10 Essential (primary) hypertension Follow-up Appointments Return Appointment in 1 week. Dressing Change Frequency Wound #1 Left,Medial,Plantar Foot Do not change entire dressing for one week. Skin Barriers/Peri-Wound Care Moisturizing lotion - to right foot and leg daily Wound Cleansing Wound #1 Left,Medial,Plantar Foot May shower with protection. Primary Wound Dressing Wound #1 Left,Medial,Plantar Foot Silver Collagen - moisten with hydrogel Secondary Dressing Wound #1 Left,Medial,Plantar Foot Foam - donut Dry Gauze Edema Control Avoid standing for long periods of time Elevate legs to the level of the heart or above for 30 minutes daily and/or when sitting, a frequency of: Exercise regularly Support Garment 20-30 mm/Hg pressure to: - juxtalite compression garment to right leg daily Off-Loading Total Contact Cast to Left Lower Extremity Patient Medications llergies: bee venom protein (honey bee), Sulfa (Sulfonamide Antibiotics), Farxiga A Notifications Medication Indication Start End prior to debridement 06/05/2020 lidocaine DOSE topical 4 % cream - cream topical Electronic Signature(s) Signed: 06/05/2020 4:47:33 PM By: Worthy Keeler PA-C Signed: 06/05/2020 5:36:04 PM By: [name] RN, BSN Entered By: [name] on 06/05/2020 13:16:53 -------------------------------------------------------------------------------- Problem List Details Patient Name: Date of Service: [name] [name] E. 06/05/2020 12:30 PM Medical Record Number: [id number] Patient Account Number: [id number] Date of Birth/Sex: Treating RN: July 03, 1946 (74 y.o. [name] Other Clinician: Referring Provider: Treating Provider/Extender: Adele Schilder in Treatment: 20 Active  Problems ICD-10 Encounter Code Description Active Date MDM Diagnosis E11.621 Type 2 diabetes mellitus with foot ulcer 01/17/2020 No Yes L97.522 Non-pressure chronic ulcer of other part of left foot with fat layer exposed 01/17/2020 No Yes M14.672 Charcot's joint, left ankle and foot 01/17/2020 No Yes E11.40 Type 2 diabetes mellitus with diabetic neuropathy, unspecified 01/17/2020 No Yes I10 Essential (primary) hypertension 01/17/2020 No Yes Inactive Problems Resolved Problems Electronic Signature(s) Signed: 06/05/2020 12:44:36 PM By: Worthy Keeler PA-C Entered By: Worthy Keeler on 06/05/2020 12:44:35 -------------------------------------------------------------------------------- Progress Note Details Patient Name: Date of Service: [name] [name], [name] [name] [name] E. 06/05/2020 12:30 PM Medical Record Number: [id number] Patient Account Number: [id number] Date of Birth/Sex: Treating RN: [birth year] (74 y.o. [name] Other Clinician: Referring Provider: Treating Provider/Extender: Adele Schilder in Treatment: 20 Subjective Chief Complaint Information obtained from Patient Left foot ulcer History of Present Illness (HPI) 01/17/2020 upon evaluation today patient presents for initial evaluation here in our clinic concerning issues she has been having with a  left medial/plantar foot ulcer. This is actually been an issue for her since October 2020. She has been seeing Dr. Doran Durand for quite some time during that course. Fortunately there is no signs of active infection at this time. Or least no mention of this to have seen in general. With that being said unfortunately I do see some signs of erythema noted today that does have me concerned about the possibility of infection at this point in the surrounding area of the wound. There is also a warm to touch at the site which is somewhat concerning. Fortunately there is no evidence of systemic infection  which is great news. The patient does have a history of diabetes mellitus type 2, Charcot foot which is what led to the wound, and hypertension. She notes that she was in a cast for some time with Dr. Doran Durand for about 8 weeks. During that time they were utilizing according to the patient silver nitrate along with a foam doughnut and then Coban to secure in place in the cast in place. With that being said I do not have the actual records to review we are going to try to get a hold of those unfortunately they would not flow over into care everywhere I did look today. She has been seeing Dr. Doran Durand and his physician assistant Larkin Ina up until the end of May and apparently is still seeing them on a regular basis every 2 weeks roughly. She has also tried Iodosorb without effect here. 01/24/2020 upon evaluation today patient actually appears to be doing quite well with regard to her wounds. She has been tolerating the dressing changes without complication. Fortunately there is no signs of active infection spreading which is good news. Her culture did show signs of Staph aureus I did place her on Augmentin due to the erythema surrounding the wound. With that being said the wound does appear to be doing better she has her longer walking cast/boot and I think that is actually good for her for the time being. I am considering reinitiating total contact cast when she gets back from vacation but next week she will actually be out of town at [employer] she knows not to get in the water but she still obviously is planning to enjoy herself she is going to take it easy on her foot however. 02/07/2020 upon evaluation today patient appears to be doing fairly well in regard to her ulcer on her foot. Fortunately there is no signs of severe infection at this time which is great news and overall very pleased in that regard. With that being said I do think that she could still benefit from a total contact cast. Nonetheless she  is using her walking boot which at least provide some protection and that it prevents some of the friction occurring when she is ambulating. 02/14/2020 upon evaluation today patient appears to be doing well with regard to her foot ulcer. This is actually measuring a little bit smaller yet again this week. Overall very pleased with where things stand and I do not see any signs of active infection at this time which is also good news. Since she is measuring better the patient has wanting to somewhat hold off on proceeding with the total contact cast which I think is reasonable at this point. 02/28/2020 on evaluation today patient appears to be doing well in general in regard to her wound although she has a lot of callus buildup as compared to last time I saw her.  This is can require sharp debridement today. I do believe she really needs the total contact cast as well which we have discussed previous. 7/23; patient comes in for a total contact cast change 03/06/2020 on evaluation today patient appears to be doing quite well with regard to her wounds. Fortunately the wound bed is measuring smaller and looking much better there is little callus noted although there is some debridement necessary today. 03/13/2020 on evaluation today patient's wound actually appears to be doing excellent which is great news. With that being said unfortunately she is having some issues currently with her left leg where she does have cellulitis it appears. This may have come from an area that rubbed underneath the cast from last week that we noted we padded that area and it looks to be doing excellent at this point but nonetheless the leg was somewhat painful, swollen, and somewhat erythematous. She also had an elevated white blood cell count of 11.5 based on what I saw on looking at her records from the med center in [facility] from where she was seen yesterday. Unfortunately with [place] having a provider on vacation there was no one  here in the clinic in the afternoon when she called therefore she went to the ER as advised. Subsequently they did not cut off the cast as they did not have anyone from orthopedics there to do so and subsequently also did not have the ability to do the Doppler for evaluation of DVT They recommended therefore given her dose of Eliquis as well as . Augmentin and sent her home to come see [place] today to have the cast taken off and then she is supposed to go back to have the study for DVT performed they are following. 03/20/2020 upon evaluation today patient appears to be doing well with regard to her foot all things considered we have not been able to use the total contact cast due to the infection that she had last week. She has been on the doxycycline and she had a 10-day supply of that I do believe that is helping and her leg appears to be doing better. With that being said there is fortunately no signs of active infection systemically at this time which is good news. No fevers, chills, nausea, vomiting, or diarrhea. 03/27/2020 upon evaluation today patient appears to be doing well with regard to her foot ulcer. There does not appear to be signs of active infection which is great news. Overall I am very pleased with where things stand at this point. 04/03/2020 upon evaluation today patient appears to be doing pretty well in regard to the overall appearance of her wound. Fortunately there is no signs of active infection at this time which is great news. No fevers, chills, nausea, vomiting, or diarrhea. With that being said she does have some blue-green drainage that actually is a little bit concerning to me for the possibility of Pseudomonas. I discussed that with the patient today. With that being said I do believe that we may be able to manage this however with the topical antibiotic cream as opposed to having to do anything oral especially since she seems to be doing so well with overall appearance of  the wound. 04/10/2020 on evaluation today patient appears to be doing about the same roughly in regard to the overall size of her wound. With that being said she fortunately has not shown any signs of worsening overall which is good news. I do believe that she is  doing a great job trying to offload but again she may still do better with the cast. I do not see in the blue-green drainage that we noticed previously I do believe the gentamicin help in this regard. 04/17/2020 on evaluation today patient's wound appears to be doing about the same at this point. There is no significant improvement at this point. No fever chills noted. She is up for put the cast back on the day. That she states in a couple weeks she will need to have this off to go to a workshop. 04/24/2020 on evaluation today patient appears to be doing significantly better in regard to her wound. Fortunately there is no signs of active infection and overall feel like she is making great progress the cast seems to have done excellent for her. 05/01/2020 upon evaluation today patient presents for reevaluation she really does not appear to be doing too badly in regard to the actual wound on the left foot we have been managing. Unfortunately she has bilateral lower extremity edema with blisters between the webspace of her first and second toe on both feet. She has a tremendous amount of edema in the legs which I think is where this is coming from it does not appear to be infected but nonetheless I do believe this is can be something that needs to be addressed today. Obviously this means we probably will not be putting the cast on at this point. She attributes this to the fact that she was sitting with her feet on the floor much longer during a conference last week she had a great time but unfortunately had a lot of complications as a result. 05/08/2020 upon evaluation today patient appears to be doing somewhat better in regard to her wounds at this  time. Fortunately there is no signs of active infection which is great news. With that being said I do believe that the blisters have ruptured and unfortunately did not just reattach I will remove some of the blistered tissue today. With that being said I do think the wound itself on the plantar aspect of left foot does need to have sharp debridement. 05/15/2020 upon evaluation today patient appears to be doing about the same in regard to her foot ulcer. Unfortunately in the past week her husband had a fall where he sustained a mild traumatic brain bleed. Fortunately he is doing better but being that he was in the hospital she had a walk on this a lot more. The wound does not appear to be any better is also not really appearing to be significantly worse which is good news. There is no signs of active infection at this time. 10/14; patient with a small diabetic wound on the medial part of her left foot. We have been using silver collagen a total contact cast making good progress. I think the patient had a series of blisters on her dorsal foot probably secondary to having her legs recumbent for 3 days while in a conference in [place]. We wrapped her leg last week these are all healed. We did not previously have her in compression on the right leg. 05/29/2020 upon evaluation today patient appears to be doing well with regard to the wound on the plantar aspect of her foot medially. This is measuring smaller and looking much better than last time I saw her. Again when I did see her last was 2 weeks back and the wound was significantly larger. I do believe the cast is helping and I  believe the collagen is a good option for her. 06/05/2020 on evaluation today patient appears to be doing well with regard to her foot ulcer this is actually measuring significantly better and overall I feel like she is doing excellent. There is no signs of active infection at this time. Objective Constitutional Well-nourished  and well-hydrated in no acute distress. Vitals Time Taken: 12:50 PM, Height: 66 in, Weight: 245 lbs, BMI: 39.5, Temperature: 99.1 F, Pulse: 105 bpm, Respiratory Rate: 18 breaths/min, Blood Pressure: 150/77 mmHg, Capillary Blood Glucose: 119 mg/dl. General Notes: glucose per pt report Respiratory normal breathing without difficulty. Psychiatric this patient is able to make decisions and demonstrates good insight into disease process. Alert and Oriented x 3. pleasant and cooperative. General Notes: Patient's cast seems to be doing excellent job keeping pressure off of the wound location to allow this to heal and in general I feel like she is doing extremely well. There is no signs of active infection at this time systemically which is great news and overall I think that we are headed in the appropriate direction. Sharp debridement was performed to clear away some of the necrotic debris in the surface of the wound there was really no significant callus to remove. Integumentary (Hair, Skin) Wound #1 status is Open. Original cause of wound was Gradually Appeared. The wound is located on the [place]. The wound measures 0.2cm length x 0.3cm width x 0.3cm depth; 0.047cm^2 area and 0.014cm^3 volume. There is Fat Layer (Subcutaneous Tissue) exposed. There is no tunneling or undermining noted. There is a small amount of serous drainage noted. The wound margin is well defined and not attached to the wound base. There is large (67- 100%) pink granulation within the wound bed. There is no necrotic tissue within the wound bed. Assessment Active Problems ICD-10 Type 2 diabetes mellitus with foot ulcer Non-pressure chronic ulcer of other part of left foot with fat layer exposed Charcot's joint, left ankle and foot Type 2 diabetes mellitus with diabetic neuropathy, unspecified Essential (primary) hypertension Procedures Wound #1 Pre-procedure diagnosis of Wound #1 is a Diabetic  Wound/Ulcer of the Lower Extremity located on the Left,Medial,Plantar Foot .Severity of Tissue Pre Debridement is: Fat layer exposed. There was a Excisional Skin/Subcutaneous Tissue Debridement with a total area of 0.25 sq cm performed by Worthy Keeler, PA. With the following instrument(s): Curette to remove Viable and Non-Viable tissue/material. Material removed includes Callus, Subcutaneous Tissue, and Slough after achieving pain control using Lidocaine 4% Topical Solution. No specimens were taken. A time out was conducted at 13:10, prior to the start of the procedure. A Minimum amount of bleeding was controlled with N/A. The procedure was tolerated well with a pain level of 0 throughout and a pain level of 0 following the procedure. Post Debridement Measurements: 0.2cm length x 0.3cm width x 0.3cm depth; 0.014cm^3 volume. Character of Wound/Ulcer Post Debridement is improved. Severity of Tissue Post Debridement is: Fat layer exposed. Post procedure Diagnosis Wound #1: Same as Pre-Procedure Pre-procedure diagnosis of Wound #1 is a Diabetic Wound/Ulcer of the Lower Extremity located on the Left,Medial,Plantar Foot . There was a T Contact otal Cast Procedure by Worthy Keeler, PA. Post procedure Diagnosis Wound #1: Same as Pre-Procedure Plan Follow-up Appointments: Return Appointment in 1 week. Dressing Change Frequency: Wound #1 Left,Medial,Plantar Foot: Do not change entire dressing for one week. Skin Barriers/Peri-Wound Care: Moisturizing lotion - to right foot and leg daily Wound Cleansing: Wound #1 Left,Medial,Plantar Foot: May shower with protection. Primary Wound  Dressing: Wound #1 Left,Medial,Plantar Foot: Silver Collagen - moisten with hydrogel Secondary Dressing: Wound #1 Left,Medial,Plantar Foot: Foam - donut Dry Gauze Edema Control: Avoid standing for long periods of time Elevate legs to the level of the heart or above for 30 minutes daily and/or when sitting, a  frequency of: Exercise regularly Support Garment 20-30 mm/Hg pressure to: - juxtalite compression garment to right leg daily Off-Loading: T Contact Cast to Left Lower Extremity otal The following medication(s) was prescribed: lidocaine topical 4 % cream cream topical for prior to debridement was prescribed at facility 1. I would recommend currently that we continue with the wound care measures as before the patient is in agreement with that plan this includes the use of silver collagen will drop the drawtex as there is no need for that any longer since the patient's wound has filled in quite significantly and gotten a lot smaller. 2. We will also get a continue with the total contact cast which is doing an excellent job at helping offload she is also still taking it easy try to do as little walking as possible. We will see patient back for reevaluation in 1 week here in the clinic. If anything worsens or changes patient will contact our office for additional recommendations. Electronic Signature(s) Signed: 06/05/2020 1:56:22 PM By: Worthy Keeler PA-C Entered By: Worthy Keeler on 06/05/2020 13:56:21 -------------------------------------------------------------------------------- Total Contact Cast Details Patient Name: Date of Service: [name] [name], [name] [name] E. 06/05/2020 12:30 PM Medical Record Number: [id number] Patient Account Number: [id number] Date of Birth/Sex: Treating RN: January 09, 1946 (74 y.o. [name] Other Clinician: Referring Provider: Treating Provider/Extender: Adele Schilder in Treatment: 78 T Contact Cast Applied for Wound Assessment: otal Wound #1 Left,Medial,Plantar Foot Performed By: Physician Worthy Keeler, PA Post Procedure Diagnosis Same as Pre-procedure Electronic Signature(s) Signed: 06/05/2020 4:47:33 PM By: Worthy Keeler PA-C Signed: 06/05/2020 5:36:04 PM By: [name] RN, BSN Entered By:  [name] on 06/05/2020 13:15:00 -------------------------------------------------------------------------------- SuperBill Details Patient Name: Date of Service: [name] [name] [name] E. 06/05/2020 Medical Record Number: [id number] Patient Account Number: [id number] Date of Birth/Sex: Treating RN: Jan 04, 1946 (74 y.o. [name] Other Clinician: Referring Provider: Treating Provider/Extender: Adele Schilder in Treatment: 20 Diagnosis Coding ICD-10 Codes Code Description E11.621 Type 2 diabetes mellitus with foot ulcer L97.522 Non-pressure chronic ulcer of other part of left foot with fat layer exposed M14.672 Charcot's joint, left ankle and foot E11.40 Type 2 diabetes mellitus with diabetic neuropathy, unspecified I10 Essential (primary) hypertension Facility Procedures CPT4 Code: 10626948 Description: 54627 - DEB SUBQ TISSUE 20 SQ CM/< ICD-10 Diagnosis Description L97.522 Non-pressure chronic ulcer of other part of left foot with fat layer exposed Modifier: Quantity: 1 Physician Procedures : CPT4 Code Description Modifier [id number] 11042 - WC PHYS SUBQ TISS 20 SQ CM ICD-10 Diagnosis Description L97.522 Non-pressure chronic ulcer of other part of left foot with fat layer exposed Quantity: 1 Electronic Signature(s) Signed: 06/05/2020 1:56:34 PM By: Worthy Keeler PA-C Entered By: Worthy Keeler on 06/05/2020 13:56:33

## 2020-06-07 ENCOUNTER — Encounter: Payer: Self-pay | Admitting: Internal Medicine

## 2020-06-08 ENCOUNTER — Other Ambulatory Visit: Payer: Self-pay

## 2020-06-08 ENCOUNTER — Encounter (HOSPITAL_COMMUNITY): Payer: Self-pay | Admitting: Emergency Medicine

## 2020-06-08 ENCOUNTER — Ambulatory Visit (HOSPITAL_COMMUNITY)
Admission: EM | Admit: 2020-06-08 | Discharge: 2020-06-08 | Disposition: A | Discharge status: Home / Self Care | Payer: Medicare Other | Attending: Emergency Medicine | Admitting: Emergency Medicine

## 2020-06-08 DIAGNOSIS — R300 Dysuria: Secondary | ICD-10-CM | POA: Diagnosis not present

## 2020-06-08 DIAGNOSIS — R350 Frequency of micturition: Secondary | ICD-10-CM

## 2020-06-08 DIAGNOSIS — N390 Urinary tract infection, site not specified: Secondary | ICD-10-CM | POA: Diagnosis not present

## 2020-06-08 LAB — POCT URINALYSIS DIPSTICK, ED / UC
Bilirubin Urine: NEGATIVE
Glucose, UA: NEGATIVE mg/dL
Ketones, ur: NEGATIVE mg/dL
Nitrite: NEGATIVE
Protein, ur: 100 mg/dL — AB
Specific Gravity, Urine: 1.02 (ref 1.005–1.030)
Urobilinogen, UA: 0.2 mg/dL (ref 0.0–1.0)
pH: 6.5 (ref 5.0–8.0)

## 2020-06-08 MED ORDER — CEPHALEXIN 500 MG PO CAPS
500.0000 mg | ORAL_CAPSULE | Freq: Two times a day (BID) | ORAL | 0 refills | Status: DC
Start: 2020-06-08 — End: 2020-07-17

## 2020-06-08 MED ORDER — CEPHALEXIN 500 MG PO CAPS
500.0000 mg | ORAL_CAPSULE | Freq: Two times a day (BID) | ORAL | 0 refills | Status: DC
Start: 2020-06-08 — End: 2020-06-08

## 2020-06-08 NOTE — ED Triage Notes (Signed)
Pt presents with dysuria, frequency xs 2 days. States was up every hour last night.

## 2020-06-08 NOTE — Discharge Instructions (Signed)
Complete course of antibiotics.  Drink plenty of water to empty bladder regularly. Avoid alcohol and caffeine as these may irritate the bladder.   We would call you if your culture indicates need to change antibiotics.  Please return for any worsening of symptoms.

## 2020-06-08 NOTE — ED Provider Notes (Signed)
[facility]    CSN: [id number] Arrival date & time: 06/08/20  1230      History   Chief Complaint Chief Complaint  Patient presents with  . Dysuria  . Urinary Frequency    HPI [name] [name] is a 74 y.o. female.   [name] [name] presents with complaints of urinary frequency, urgency and burning with urination. Started two days ago and has been worsening. Nausea yesterday. No vomiting. Denies hematuria. Low grade temp but no fevers. No back pain. Has had previous uti's in the past but has been years. No known antibiotic resistance.    ROS per HPI, negative if not otherwise mentioned.       Past Medical History:  Diagnosis Date  . Arthritis   . Closed fracture of fifth metatarsal bone 05/13/2018  . Deep vein thrombosis ([place])    right calf - 05/2012   . Diabetes mellitus without complication (HCC)    diet controlled   . GERD (gastroesophageal reflux disease)   . Hyperlipidemia   . Hypertension    Ejection fraction =>55% Left ventricular systolic function is normal. Left ventricular wall motion is normal    . Lymphocytic colitis   . Neuropathy    diabetic - in bilateral feet   . Pityriasis lichenoides chronica   . Sleep apnea    bipap    Patient Active Problem List   Diagnosis Date Noted  . Fatigue 11/13/2019  . Dyslipidemia 11/12/2019  . Pityriasis lichenoides chronica 09/18/2019  . Nausea 09/18/2019  . Insomnia 08/07/2019  . Anxiety 08/07/2019  . Psoriatic arthritis ([place]) 08/07/2019  . H/O: gout 08/07/2019  . Recurrent UTI 08/07/2019  . Charcot's joint of left foot 08/07/2019  . Depression 08/07/2019  . Pneumonia due to COVID-19 virus 07/09/2019  . Physical deconditioning 07/08/2019  . COVID-19 07/07/2019  . Diabetic peripheral neuropathy ([place]) 04/10/2019  . Pain in left knee 10/13/2018  . Ischemic colitis ([place]) 10/06/2018  . Arthritis 07/21/2018  . Urinary incontinence 07/21/2018  . Diabetic foot ulcer ([place]) 07/13/2018  . Cellulitis of  toe of left foot 05/02/2018  . DM2 (diabetes mellitus, type 2) ([place]) 05/02/2018  . Psoriasis 05/02/2018  . Osteoarthritis of knee 10/18/2017  . Chest pain 01/12/2015  . Right bundle branch block 05/20/2014  . OSA (obstructive sleep apnea), BiPAP 03/12/2013  . Essential hypertension 03/12/2013  . Obesity 09/14/2012  . Hyponatremia 09/14/2012  . S/P right THA, AA 09/13/2012  . Deep venous thrombosis of lower extremity ([place]) 08/10/2010  . 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 DILATION AND CURETTAGE OF UTERUS    . HAMMER TOE SURGERY    . right hand surgery      due to blood infection   . torn meniscus repair      right knee   . TOTAL HIP ARTHROPLASTY  09/13/2012   Procedure: TOTAL HIP ARTHROPLASTY ANTERIOR APPROACH;  Surgeon: Mauri Pole, MD;  Location: [facility];  Service: Orthopedics;  Laterality: Right;    OB History   No obstetric history on file.      Home Medications    Prior to Admission medications   Medication Sig Start Date End Date Taking? Authorizing Provider  acetaminophen (TYLENOL) 325 MG tablet Take 2 tablets (650 mg total) by mouth every 6 (six) hours as needed for mild pain or headache. 07/13/19   Elgergawy, Silver Huguenin, MD  allopurinol (ZYLOPRIM) 100 MG tablet Take 1 tablet (100 mg  total) by mouth 2 (two) times daily. 04/21/20   Binnie Rail, MD  ALPRAZolam Duanne Moron) 0.25 MG tablet Take 1 tablet (0.25 mg total) by mouth 2 (two) times daily as needed for anxiety. 11/13/19   Binnie Rail, MD  aspirin 81 MG tablet Take 81 mg by mouth every other day.     [provider]  azelastine (ASTELIN) 0.1 % nasal spray Place 2 sprays into both nostrils 2 (two) times daily. Use in each nostril as directed 12/26/19   Binnie Rail, MD  Budesonide (UCERIS) 9 MG TB24 Take 9 mg by mouth daily.     [provider]  cephALEXin (KEFLEX) 500 MG capsule Take 1 capsule (500 mg total) by mouth 2 (two) times daily  for 7 days. 06/08/20 06/15/20  Zigmund Gottron, NP  Certolizumab Pegol (CIMZIA [place]) Inject 400 mg into the skin every 30 (thirty) days. 200 mg on each side of stomach    [provider]  Cholecalciferol (VITAMIN D3) 1000 UNITS CAPS Take 1,000 Units by mouth 2 (two) times daily.     [provider]  citalopram (CELEXA) 40 MG tablet TAKE 1 TABLET BY MOUTH EVERY DAY 04/22/20   Burns, Claudina Lick, MD  Cyanocobalamin (VITAMIN B-12) 1000 MCG SUBL Place 1,000 mcg under the tongue daily.    [provider]  cyclobenzaprine (FLEXERIL) 10 MG tablet Take 1 tablet (10 mg total) by mouth at bedtime. 04/21/20   Binnie Rail, MD  docusate sodium (COLACE) 100 MG capsule Take 1 capsule (100 mg total) by mouth 2 (two) times daily. 08/30/19   Binnie Rail, MD  folic acid (FOLVITE) 1 MG tablet TAKE 1 TABLET(1 MG) BY MOUTH DAILY 04/22/20   Binnie Rail, MD  Insulin Pen Needle (B-D UF III MINI PEN NEEDLES) 31G X 5 MM MISC Use daily for insulin pen 01/23/20   Binnie Rail, MD  LEVEMIR FLEXTOUCH 100 UNIT/ML FlexPen ADMINISTER 20 UNITS UNDER THE SKIN TWICE DAILY 04/05/20   Binnie Rail, MD  lisinopril-hydrochlorothiazide (ZESTORETIC) 20-25 MG tablet Take 1 tablet by mouth daily. 05/02/20   Binnie Rail, MD  loperamide (IMODIUM) 2 MG capsule Take 6 mg by mouth 2 (two) times daily.    [provider]  Melatonin 5 MG SUBL Place 5 mg under the tongue at bedtime.    [provider]  metFORMIN (GLUCOPHAGE-XR) 500 MG 24 hr tablet TAKE 1 TABLET(500 MG) BY MOUTH TWICE DAILY 04/22/20   Binnie Rail, MD  omeprazole (PRILOSEC) 40 MG capsule Take 40 mg by mouth at bedtime.    [provider]  potassium chloride (KLOR-CON) 10 MEQ tablet TAKE 2 TABLETS(20 MEQ) BY MOUTH DAILY 04/22/20   Binnie Rail, MD  Probiotic Product (ALIGN PO) Take 1 capsule by mouth daily.    [provider]  rosuvastatin (CRESTOR) 5 MG tablet Take 1 tablet (5 mg total) by mouth daily. 04/05/20   Binnie Rail, MD  triamcinolone cream (KENALOG) 0.1 % Apply 1 application topically 2 (two) times daily. 09/18/19   Binnie Rail, MD    Family History Family History  Problem Relation Age of Onset  . Hypertension Mother     Social History Social History   Tobacco Use  . Smoking status: Never Smoker  . Smokeless tobacco: Never Used  Substance Use Topics  . Alcohol use: Yes    Comment: occasional wine   . Drug use: No     Allergies  Other, Bee venom, Farxiga [dapagliflozin], and Sulfa antibiotics   Review of Systems Review of Systems   Physical Exam Triage Vital Signs ED Triage Vitals  Enc Vitals Group     BP 06/08/20 1258 (!) 186/93     Pulse Rate 06/08/20 1258 (!) 101     Resp 06/08/20 1258 19     Temp 06/08/20 1258 98.8 F (37.1 C)     Temp Source 06/08/20 1258 Oral     SpO2 06/08/20 1258 96 %     Weight --      Height --      Head Circumference --      Peak Flow --      Pain Score 06/08/20 1257 7     Pain Loc --      Pain Edu? --      Excl. in [place]? --    No data found.  Updated Vital Signs BP (!) 186/93 (BP Location: Right Arm)   Pulse (!) 101   Temp 98.8 F (37.1 C) (Oral)   Resp 19   SpO2 96%   Visual Acuity Right Eye Distance:   Left Eye Distance:   Bilateral Distance:    Right Eye Near:   Left Eye Near:    Bilateral Near:     Physical Exam Constitutional:      General: She is not in acute distress.    Appearance: She is well-developed.  Cardiovascular:     Rate and Rhythm: Normal rate.  Pulmonary:     Effort: Pulmonary effort is normal.  Abdominal:     Tenderness: There is no abdominal tenderness. There is no right CVA tenderness or left CVA tenderness.  Skin:    General: Skin is warm and dry.  Neurological:     Mental Status: She is alert and oriented to person, place, and time.      UC Treatments / Results  Labs (all labs ordered are listed, but only abnormal results are displayed) Labs Reviewed  POCT URINALYSIS DIPSTICK,  ED / UC - Abnormal; Notable for the following components:      Result Value   Hgb urine dipstick MODERATE (*)    Protein, ur 100 (*)    Leukocytes,Ua LARGE (*)    All other components within normal limits  URINE CULTURE    EKG   Radiology No results found.  Procedures Procedures (including critical care time)  Medications Ordered in UC Medications - No data to display  Initial Impression / Assessment and Plan / UC Course  I have reviewed the triage vital signs and the nursing notes.  Pertinent labs & imaging results that were available during my care of the patient were reviewed by me and considered in my medical decision making (see chart for details).     ua and history consistent with uri with keflex initiated. Culture pending. Return precautions provided. Patient verbalized understanding and agreeable to plan.   Final Clinical Impressions(s) / UC Diagnoses   Final diagnoses:  Lower urinary tract infectious disease     Discharge Instructions     Complete course of antibiotics.  Drink plenty of water to empty bladder regularly. Avoid alcohol and caffeine as these may irritate the bladder.   We would call you if your culture indicates need to change antibiotics.  Please return for any worsening of symptoms.    ED Prescriptions    Medication Sig Dispense Auth. Provider   cephALEXin (KEFLEX) 500 MG capsule Take 1 capsule (500 mg total)  by mouth 2 (two) times daily for 7 days. 14 capsule Zigmund Gottron, NP     PDMP not reviewed this encounter.   Zigmund Gottron, NP 06/08/20 1343

## 2020-06-10 LAB — URINE CULTURE: Culture: >=100000 — AB

## 2020-06-12 ENCOUNTER — Other Ambulatory Visit: Payer: Self-pay

## 2020-06-12 ENCOUNTER — Encounter (HOSPITAL_BASED_OUTPATIENT_CLINIC_OR_DEPARTMENT_OTHER): Payer: Medicare Other | Attending: Physician Assistant | Admitting: Physician Assistant

## 2020-06-12 DIAGNOSIS — E1140 Type 2 diabetes mellitus with diabetic neuropathy, unspecified: Secondary | ICD-10-CM | POA: Insufficient documentation

## 2020-06-12 DIAGNOSIS — I10 Essential (primary) hypertension: Secondary | ICD-10-CM | POA: Insufficient documentation

## 2020-06-12 DIAGNOSIS — E11621 Type 2 diabetes mellitus with foot ulcer: Secondary | ICD-10-CM | POA: Diagnosis not present

## 2020-06-12 DIAGNOSIS — L97522 Non-pressure chronic ulcer of other part of left foot with fat layer exposed: Secondary | ICD-10-CM | POA: Insufficient documentation

## 2020-06-12 NOTE — Progress Notes (Addendum)
[name] [name] ([id number]) Visit Report for 06/12/2020 Chief Complaint Document Details Patient Name: Date of Service: [name] [name], [name] E. 06/12/2020 1:45 PM Medical Record Number: [id number] Patient Account Number: [id number] Date of Birth/Sex: Treating RN: 1945-09-01 (74 y.o. [name] [name] Primary Care Provider: Billey [name] Other Clinician: Referring Provider: Treating Provider/Extender: [name] [name] in Treatment: 21 Information Obtained from: Patient Chief Complaint Left foot ulcer Electronic Signature(s) Signed: 06/12/2020 2:08:50 PM By: [name] Keeler PA-C Entered By: [name] [name] on 06/12/2020 14:08:50 -------------------------------------------------------------------------------- Debridement Details Patient Name: Date of Service: [name] [name] 06/12/2020 1:45 PM Medical Record Number: [id number] Patient Account Number: [id number] Date of Birth/Sex: Treating RN: 09/28/1945 (74 y.o. [name] [name] Primary Care Provider: Billey [name] Other Clinician: Referring Provider: Treating Provider/Extender: [name] [name] in Treatment: 21 Debridement Performed for Assessment: Wound #1 Left,Medial,Plantar Foot Performed By: Physician [name] Keeler, PA Debridement Type: Debridement Severity of Tissue Pre Debridement: Fat layer exposed Level of Consciousness (Pre-procedure): Awake and Alert Pre-procedure Verification/Time Out Yes - 14:55 Taken: Start Time: 14:57 Pain Control: Lidocaine 4% T opical Solution T Area Debrided (L x W): otal 0.4 (cm) x 0.4 (cm) = 0.16 (cm) Tissue and other material debrided: Viable, Non-Viable, Callus, Slough, Subcutaneous, Slough Level: Skin/Subcutaneous Tissue Debridement Description: Excisional Instrument: Curette Bleeding: Minimum Hemostasis Achieved: Pressure End Time: 15:00 Procedural Pain: 0 Post Procedural Pain: 0 Response to Treatment: Procedure was tolerated well Level of  Consciousness (Post- Awake and Alert procedure): Post Debridement Measurements of Total Wound Length: (cm) 0.3 Width: (cm) 0.3 Depth: (cm) 0.2 Volume: (cm) 0.014 Character of Wound/Ulcer Post Debridement: Improved Severity of Tissue Post Debridement: Fat layer exposed Post Procedure Diagnosis Same as Pre-procedure Electronic Signature(s) Signed: 06/12/2020 5:20:58 PM By: [name] Keeler PA-C Signed: 06/12/2020 5:51:45 PM By: [name] Gouty RN, BSN Entered By: [name] [name] on 06/12/2020 15:00:40 -------------------------------------------------------------------------------- HPI Details Patient Name: Date of Service: [name] [name], [name] RA E. 06/12/2020 1:45 PM Medical Record Number: [id number] Patient Account Number: [id number] Date of Birth/Sex: Treating RN: Apr 01, 1946 (74 y.o. [name] [name] Primary Care Provider: Billey [name] Other Clinician: Referring Provider: Treating Provider/Extender: [name] [name] in Treatment: 21 History of Present Illness HPI Description: 01/17/2020 upon evaluation today patient presents for initial evaluation here in our clinic concerning issues she has been having with a left medial/plantar foot ulcer. This is actually been an issue for her since October 2020. She has been seeing Dr. Doran [name] for quite some time during that course. Fortunately there is no signs of active infection at this time. Or least no mention of this to have seen in general. With that being said unfortunately I do see some signs of erythema noted today that does have me concerned about the possibility of infection at this point in the surrounding area of the wound. There is also a warm to touch at the site which is somewhat concerning. Fortunately there is no evidence of systemic infection which is great news. The patient does have a history of diabetes mellitus type 2, Charcot foot which is what led to the wound, and hypertension. She notes that she was in a cast for  some time with Dr. Doran [name] for about 8 weeks. During that time they were utilizing according to the patient silver nitrate along with a foam doughnut and then Coban to secure in place in the cast in place. With that being said I do not have the actual records  to review we are going to try to get a hold of those unfortunately they would not flow over into care everywhere I did look today. She has been seeing Dr. Doran [name] and his physician assistant [name] [name] up until the end of May and apparently is still seeing them on a regular basis every 2 weeks roughly. She has also tried Iodosorb without effect here. 01/24/2020 upon evaluation today patient actually appears to be doing quite well with regard to her wounds. She has been tolerating the dressing changes without complication. Fortunately there is no signs of active infection spreading which is good news. Her culture did show signs of Staph aureus I did place her on Augmentin due to the erythema surrounding the wound. With that being said the wound does appear to be doing better she has her longer walking cast/boot and I think that is actually good for her for the time being. I am considering reinitiating total contact cast when she gets back from vacation but next week she will actually be out of town at [employer] she knows not to get in the water but she still obviously is planning to enjoy herself she is going to take it easy on her foot however. 02/07/2020 upon evaluation today patient appears to be doing fairly well in regard to her ulcer on her foot. Fortunately there is no signs of severe infection at this time which is great news and overall very pleased in that regard. With that being said I do think that she could still benefit from a total contact cast. Nonetheless she is using her walking boot which at least provide some protection and that it prevents some of the friction occurring when she is ambulating. 02/14/2020 upon evaluation today patient  appears to be doing well with regard to her foot ulcer. This is actually measuring a little bit smaller yet again this week. Overall very pleased with where things stand and I do not see any signs of active infection at this time which is also good news. Since she is measuring better the patient has wanting to somewhat hold off on proceeding with the total contact cast which I think is reasonable at this point. 02/28/2020 on evaluation today patient appears to be doing well in general in regard to her wound although she has a lot of callus buildup as compared to last time I saw her. This is can require sharp debridement today. I do believe she really needs the total contact cast as well which we have discussed previous. 7/23; patient comes in for a total contact cast change 03/06/2020 on evaluation today patient appears to be doing quite well with regard to her wounds. Fortunately the wound bed is measuring smaller and looking much better there is little callus noted although there is some debridement necessary today. 03/13/2020 on evaluation today patient's wound actually appears to be doing excellent which is great news. With that being said unfortunately she is having some issues currently with her left leg where she does have cellulitis it appears. This may have come from an area that rubbed underneath the cast from last week that we noted we padded that area and it looks to be doing excellent at this point but nonetheless the leg was somewhat painful, swollen, and somewhat erythematous. She also had an elevated white blood cell count of 11.5 based on what I saw on looking at her records from the med center in [facility] from where she was seen yesterday. Unfortunately with [place]  having a provider on vacation there was no one here in the clinic in the afternoon when she called therefore she went to the ER as advised. Subsequently they did not cut off the cast as they did not have anyone from orthopedics  there to do so and subsequently also did not have the ability to do the Doppler for evaluation of DVT They recommended therefore given her dose of Eliquis as well as . Augmentin and sent her home to come see [place] today to have the cast taken off and then she is supposed to go back to have the study for DVT performed they are following. 03/20/2020 upon evaluation today patient appears to be doing well with regard to her foot all things considered we have not been able to use the total contact cast due to the infection that she had last week. She has been on the doxycycline and she had a 10-day supply of that I do believe that is helping and her leg appears to be doing better. With that being said there is fortunately no signs of active infection systemically at this time which is good news. No fevers, chills, nausea, vomiting, or diarrhea. 03/27/2020 upon evaluation today patient appears to be doing well with regard to her foot ulcer. There does not appear to be signs of active infection which is great news. Overall I am very pleased with where things stand at this point. 04/03/2020 upon evaluation today patient appears to be doing pretty well in regard to the overall appearance of her wound. Fortunately there is no signs of active infection at this time which is great news. No fevers, chills, nausea, vomiting, or diarrhea. With that being said she does have some blue-green drainage that actually is a little bit concerning to me for the possibility of Pseudomonas. I discussed that with the patient today. With that being said I do believe that we may be able to manage this however with the topical antibiotic cream as opposed to having to do anything oral especially since she seems to be doing so well with overall appearance of the wound. 04/10/2020 on evaluation today patient appears to be doing about the same roughly in regard to the overall size of her wound. With that being said she fortunately has not  shown any signs of worsening overall which is good news. I do believe that she is doing a great job trying to offload but again she may still do better with the cast. I do not see in the blue-green drainage that we noticed previously I do believe the gentamicin help in this regard. 04/17/2020 on evaluation today patient's wound appears to be doing about the same at this point. There is no significant improvement at this point. No fever chills noted. She is up for put the cast back on the day. That she states in a couple weeks she will need to have this off to go to a workshop. 04/24/2020 on evaluation today patient appears to be doing significantly better in regard to her wound. Fortunately there is no signs of active infection and overall feel like she is making great progress the cast seems to have done excellent for her. 05/01/2020 upon evaluation today patient presents for reevaluation she really does not appear to be doing too badly in regard to the actual wound on the left foot we have been managing. Unfortunately she has bilateral lower extremity edema with blisters between the webspace of her first and second toe on both feet.  She has a tremendous amount of edema in the legs which I think is where this is coming from it does not appear to be infected but nonetheless I do believe this is can be something that needs to be addressed today. Obviously this means we probably will not be putting the cast on at this point. She attributes this to the fact that she was sitting with her feet on the floor much longer during a conference last week she had a great time but unfortunately had a lot of complications as a result. 05/08/2020 upon evaluation today patient appears to be doing somewhat better in regard to her wounds at this time. Fortunately there is no signs of active infection which is great news. With that being said I do believe that the blisters have ruptured and unfortunately did not just reattach I  will remove some of the blistered tissue today. With that being said I do think the wound itself on the plantar aspect of left foot does need to have sharp debridement. 05/15/2020 upon evaluation today patient appears to be doing about the same in regard to her foot ulcer. Unfortunately in the past week her husband had a fall where he sustained a mild traumatic brain bleed. Fortunately he is doing better but being that he was in the hospital she had a walk on this a lot more. The wound does not appear to be any better is also not really appearing to be significantly worse which is good news. There is no signs of active infection at this time. 10/14; patient with a small diabetic wound on the medial part of her left foot. We have been using silver collagen a total contact cast making good progress. I think the patient had a series of blisters on her dorsal foot probably secondary to having her legs recumbent for 3 days while in a conference in [place]. We wrapped her leg last week these are all healed. We did not previously have her in compression on the right leg. 05/29/2020 upon evaluation today patient appears to be doing well with regard to the wound on the plantar aspect of her foot medially. This is measuring smaller and looking much better than last time I saw her. Again when I did see her last was 2 weeks back and the wound was significantly larger. I do believe the cast is helping and I believe the collagen is a good option for her. 06/05/2020 on evaluation today patient appears to be doing well with regard to her foot ulcer this is actually measuring significantly better and overall I feel like she is doing excellent. There is no signs of active infection at this time. 06/12/2020 upon evaluation today patient actually continues to show signs of good improvement which is excellent news. I am extremely pleased with how she seems to be progressing at this point in regard to her wound. There is  still some depth to the wound but I do believe the collagen is helping her quite a bit. Electronic Signature(s) Signed: 06/12/2020 3:02:44 PM By: [name] Keeler PA-C Entered By: [name] [name] on 06/12/2020 15:02:44 -------------------------------------------------------------------------------- Physical Exam Details Patient Name: Date of Service: [name] [name], [name] RA E. 06/12/2020 1:45 PM Medical Record Number: [id number] Patient Account Number: [id number] Date of Birth/Sex: Treating RN: 24-Aug-1945 (74 y.o. [name] [name] Primary Care Provider: Billey [name] Other Clinician: Referring Provider: Treating Provider/Extender: Landis Martins Weeks in Treatment: 21 Constitutional Well-nourished and well-hydrated in no acute distress. Respiratory  normal breathing without difficulty. Psychiatric this patient is able to make decisions and demonstrates good insight into disease process. Alert and Oriented x 3. pleasant and cooperative. Notes Patient's wound bed actually showed signs of good granulation at this time. There does not appear to be any evidence of active infection which is great news and overall I am extremely happy with how she is progressing. I did have to perform sharp debridement to clear away some of the callus from around the surface of the wound there was minimal slough noted as well I did all this with minimal bleeding. Post debridement the wound bed appears to be doing much better she is having minimal drainage compared to what she used to have as well. Electronic Signature(s) Signed: 06/12/2020 3:03:18 PM By: [name] Keeler PA-C Entered By: [name] [name] on 06/12/2020 15:03:17 -------------------------------------------------------------------------------- Physician Orders Details Patient Name: Date of Service: [name] [name], [name] RA E. 06/12/2020 1:45 PM Medical Record Number: [id number] Patient Account Number: [id number] Date of Birth/Sex: Treating  RN: [birth year] (74 y.o. [name] [name] Primary Care Provider: Billey [name] Other Clinician: Referring Provider: Treating Provider/Extender: [name] [name] in Treatment: 21 Verbal / Phone Orders: No Diagnosis Coding ICD-10 Coding Code Description E11.621 Type 2 diabetes mellitus with foot ulcer L97.522 Non-pressure chronic ulcer of other part of left foot with fat layer exposed M14.672 Charcot's joint, left ankle and foot E11.40 Type 2 diabetes mellitus with diabetic neuropathy, unspecified I10 Essential (primary) hypertension Follow-up Appointments Return Appointment in 1 week. Dressing Change Frequency Wound #1 Left,Medial,Plantar Foot Do not change entire dressing for one week. Skin Barriers/Peri-Wound Care Moisturizing lotion - to right foot and leg daily Wound Cleansing Wound #1 Left,Medial,Plantar Foot May shower with protection. Primary Wound Dressing Wound #1 Left,Medial,Plantar Foot Silver Collagen - moisten with hydrogel to fill hole Secondary Dressing Wound #1 Left,Medial,Plantar Foot Foam - donut Dry Gauze Edema Control Avoid standing for long periods of time Elevate legs to the level of the heart or above for 30 minutes daily and/or when sitting, a frequency of: Exercise regularly Support Garment 20-30 mm/Hg pressure to: - juxtalite compression garment to right leg daily Off-Loading Total Contact Cast to Left Lower Extremity Electronic Signature(s) Signed: 06/12/2020 5:20:58 PM By: [name] Keeler PA-C Signed: 06/12/2020 5:51:45 PM By: [name] Gouty RN, BSN Entered By: [name] [name] on 06/12/2020 15:01:33 -------------------------------------------------------------------------------- Problem List Details Patient Name: Date of Service: [name] [name] 06/12/2020 1:45 PM Medical Record Number: [id number] Patient Account Number: [id number] Date of Birth/Sex: Treating RN: October 18, 1945 (74 y.o. [name] [name] Primary Care  Provider: Billey [name] Other Clinician: Referring Provider: Treating Provider/Extender: [name] [name] in Treatment: 21 Active Problems ICD-10 Encounter Code Description Active Date MDM Diagnosis E11.621 Type 2 diabetes mellitus with foot ulcer 01/17/2020 No Yes L97.522 Non-pressure chronic ulcer of other part of left foot with fat layer exposed 01/17/2020 No Yes M14.672 Charcot's joint, left ankle and foot 01/17/2020 No Yes E11.40 Type 2 diabetes mellitus with diabetic neuropathy, unspecified 01/17/2020 No Yes I10 Essential (primary) hypertension 01/17/2020 No Yes Inactive Problems Resolved Problems Electronic Signature(s) Signed: 06/12/2020 2:08:40 PM By: [name] Keeler PA-C Entered By: [name] [name] on 06/12/2020 14:08:39 -------------------------------------------------------------------------------- Progress Note Details Patient Name: Date of Service: [name] [name], [name] RA E. 06/12/2020 1:45 PM Medical Record Number: [id number] Patient Account Number: [id number] Date of Birth/Sex: Treating RN: May 21, 1946 (74 y.o. [name] [name] Primary Care Provider: Billey [name] Other Clinician: Referring Provider: Treating Provider/Extender:  Stone III, Elby Showers, Stacy Weeks in Treatment: 21 Subjective Chief Complaint Information obtained from Patient Left foot ulcer History of Present Illness (HPI) 01/17/2020 upon evaluation today patient presents for initial evaluation here in our clinic concerning issues she has been having with a left medial/plantar foot ulcer. This is actually been an issue for her since October 2020. She has been seeing Dr. Doran [name] for quite some time during that course. Fortunately there is no signs of active infection at this time. Or least no mention of this to have seen in general. With that being said unfortunately I do see some signs of erythema noted today that does have me concerned about the possibility of infection at this point in the surrounding  area of the wound. There is also a warm to touch at the site which is somewhat concerning. Fortunately there is no evidence of systemic infection which is great news. The patient does have a history of diabetes mellitus type 2, Charcot foot which is what led to the wound, and hypertension. She notes that she was in a cast for some time with Dr. Doran [name] for about 8 weeks. During that time they were utilizing according to the patient silver nitrate along with a foam doughnut and then Coban to secure in place in the cast in place. With that being said I do not have the actual records to review we are going to try to get a hold of those unfortunately they would not flow over into care everywhere I did look today. She has been seeing Dr. Doran [name] and his physician assistant [name] [name] up until the end of May and apparently is still seeing them on a regular basis every 2 weeks roughly. She has also tried Iodosorb without effect here. 01/24/2020 upon evaluation today patient actually appears to be doing quite well with regard to her wounds. She has been tolerating the dressing changes without complication. Fortunately there is no signs of active infection spreading which is good news. Her culture did show signs of Staph aureus I did place her on Augmentin due to the erythema surrounding the wound. With that being said the wound does appear to be doing better she has her longer walking cast/boot and I think that is actually good for her for the time being. I am considering reinitiating total contact cast when she gets back from vacation but next week she will actually be out of town at [employer] she knows not to get in the water but she still obviously is planning to enjoy herself she is going to take it easy on her foot however. 02/07/2020 upon evaluation today patient appears to be doing fairly well in regard to her ulcer on her foot. Fortunately there is no signs of severe infection at this time which is great news  and overall very pleased in that regard. With that being said I do think that she could still benefit from a total contact cast. Nonetheless she is using her walking boot which at least provide some protection and that it prevents some of the friction occurring when she is ambulating. 02/14/2020 upon evaluation today patient appears to be doing well with regard to her foot ulcer. This is actually measuring a little bit smaller yet again this week. Overall very pleased with where things stand and I do not see any signs of active infection at this time which is also good news. Since she is measuring better the patient has wanting to somewhat hold off on proceeding with  the total contact cast which I think is reasonable at this point. 02/28/2020 on evaluation today patient appears to be doing well in general in regard to her wound although she has a lot of callus buildup as compared to last time I saw her. This is can require sharp debridement today. I do believe she really needs the total contact cast as well which we have discussed previous. 7/23; patient comes in for a total contact cast change 03/06/2020 on evaluation today patient appears to be doing quite well with regard to her wounds. Fortunately the wound bed is measuring smaller and looking much better there is little callus noted although there is some debridement necessary today. 03/13/2020 on evaluation today patient's wound actually appears to be doing excellent which is great news. With that being said unfortunately she is having some issues currently with her left leg where she does have cellulitis it appears. This may have come from an area that rubbed underneath the cast from last week that we noted we padded that area and it looks to be doing excellent at this point but nonetheless the leg was somewhat painful, swollen, and somewhat erythematous. She also had an elevated white blood cell count of 11.5 based on what I saw on looking at her  records from the med center in [facility] from where she was seen yesterday. Unfortunately with [place] having a provider on vacation there was no one here in the clinic in the afternoon when she called therefore she went to the ER as advised. Subsequently they did not cut off the cast as they did not have anyone from orthopedics there to do so and subsequently also did not have the ability to do the Doppler for evaluation of DVT They recommended therefore given her dose of Eliquis as well as . Augmentin and sent her home to come see [place] today to have the cast taken off and then she is supposed to go back to have the study for DVT performed they are following. 03/20/2020 upon evaluation today patient appears to be doing well with regard to her foot all things considered we have not been able to use the total contact cast due to the infection that she had last week. She has been on the doxycycline and she had a 10-day supply of that I do believe that is helping and her leg appears to be doing better. With that being said there is fortunately no signs of active infection systemically at this time which is good news. No fevers, chills, nausea, vomiting, or diarrhea. 03/27/2020 upon evaluation today patient appears to be doing well with regard to her foot ulcer. There does not appear to be signs of active infection which is great news. Overall I am very pleased with where things stand at this point. 04/03/2020 upon evaluation today patient appears to be doing pretty well in regard to the overall appearance of her wound. Fortunately there is no signs of active infection at this time which is great news. No fevers, chills, nausea, vomiting, or diarrhea. With that being said she does have some blue-green drainage that actually is a little bit concerning to me for the possibility of Pseudomonas. I discussed that with the patient today. With that being said I do believe that we may be able to manage this however with  the topical antibiotic cream as opposed to having to do anything oral especially since she seems to be doing so well with overall appearance of the wound. 04/10/2020  on evaluation today patient appears to be doing about the same roughly in regard to the overall size of her wound. With that being said she fortunately has not shown any signs of worsening overall which is good news. I do believe that she is doing a great job trying to offload but again she may still do better with the cast. I do not see in the blue-green drainage that we noticed previously I do believe the gentamicin help in this regard. 04/17/2020 on evaluation today patient's wound appears to be doing about the same at this point. There is no significant improvement at this point. No fever chills noted. She is up for put the cast back on the day. That she states in a couple weeks she will need to have this off to go to a workshop. 04/24/2020 on evaluation today patient appears to be doing significantly better in regard to her wound. Fortunately there is no signs of active infection and overall feel like she is making great progress the cast seems to have done excellent for her. 05/01/2020 upon evaluation today patient presents for reevaluation she really does not appear to be doing too badly in regard to the actual wound on the left foot we have been managing. Unfortunately she has bilateral lower extremity edema with blisters between the webspace of her first and second toe on both feet. She has a tremendous amount of edema in the legs which I think is where this is coming from it does not appear to be infected but nonetheless I do believe this is can be something that needs to be addressed today. Obviously this means we probably will not be putting the cast on at this point. She attributes this to the fact that she was sitting with her feet on the floor much longer during a conference last week she had a great time but unfortunately had  a lot of complications as a result. 05/08/2020 upon evaluation today patient appears to be doing somewhat better in regard to her wounds at this time. Fortunately there is no signs of active infection which is great news. With that being said I do believe that the blisters have ruptured and unfortunately did not just reattach I will remove some of the blistered tissue today. With that being said I do think the wound itself on the plantar aspect of left foot does need to have sharp debridement. 05/15/2020 upon evaluation today patient appears to be doing about the same in regard to her foot ulcer. Unfortunately in the past week her husband had a fall where he sustained a mild traumatic brain bleed. Fortunately he is doing better but being that he was in the hospital she had a walk on this a lot more. The wound does not appear to be any better is also not really appearing to be significantly worse which is good news. There is no signs of active infection at this time. 10/14; patient with a small diabetic wound on the medial part of her left foot. We have been using silver collagen a total contact cast making good progress. I think the patient had a series of blisters on her dorsal foot probably secondary to having her legs recumbent for 3 days while in a conference in [place]. We wrapped her leg last week these are all healed. We did not previously have her in compression on the right leg. 05/29/2020 upon evaluation today patient appears to be doing well with regard to the wound on the plantar  aspect of her foot medially. This is measuring smaller and looking much better than last time I saw her. Again when I did see her last was 2 weeks back and the wound was significantly larger. I do believe the cast is helping and I believe the collagen is a good option for her. 06/05/2020 on evaluation today patient appears to be doing well with regard to her foot ulcer this is actually measuring significantly better  and overall I feel like she is doing excellent. There is no signs of active infection at this time. 06/12/2020 upon evaluation today patient actually continues to show signs of good improvement which is excellent news. I am extremely pleased with how she seems to be progressing at this point in regard to her wound. There is still some depth to the wound but I do believe the collagen is helping her quite a bit. Objective Constitutional Well-nourished and well-hydrated in no acute distress. Vitals Time Taken: 2:19 PM, Height: 66 in, Weight: 245 lbs, BMI: 39.5, Temperature: 98.6 F, Pulse: 92 bpm, Respiratory Rate: 18 breaths/min, Blood Pressure: 142/77 mmHg. Respiratory normal breathing without difficulty. Psychiatric this patient is able to make decisions and demonstrates good insight into disease process. Alert and Oriented x 3. pleasant and cooperative. General Notes: Patient's wound bed actually showed signs of good granulation at this time. There does not appear to be any evidence of active infection which is great news and overall I am extremely happy with how she is progressing. I did have to perform sharp debridement to clear away some of the callus from around the surface of the wound there was minimal slough noted as well I did all this with minimal bleeding. Post debridement the wound bed appears to be doing much better she is having minimal drainage compared to what she used to have as well. Integumentary (Hair, Skin) Wound #1 status is Open. Original cause of wound was Gradually Appeared. The wound is located on the [place]. The wound measures 0.2cm length x 0.2cm width x 0.2cm depth; 0.031cm^2 area and 0.006cm^3 volume. There is Fat Layer (Subcutaneous Tissue) exposed. There is no tunneling or undermining noted. There is a small amount of serous drainage noted. The wound margin is well defined and not attached to the wound base. There is large (67- 100%) pink  granulation within the wound bed. There is no necrotic tissue within the wound bed. General Notes: thin layer of callus to periwound noted. Assessment Active Problems ICD-10 Type 2 diabetes mellitus with foot ulcer Non-pressure chronic ulcer of other part of left foot with fat layer exposed Charcot's joint, left ankle and foot Type 2 diabetes mellitus with diabetic neuropathy, unspecified Essential (primary) hypertension Procedures Wound #1 Pre-procedure diagnosis of Wound #1 is a Diabetic Wound/Ulcer of the Lower Extremity located on the Left,Medial,Plantar Foot .Severity of Tissue Pre Debridement is: Fat layer exposed. There was a Excisional Skin/Subcutaneous Tissue Debridement with a total area of 0.16 sq cm performed by [name] Keeler, PA. With the following instrument(s): Curette to remove Viable and Non-Viable tissue/material. Material removed includes Callus, Subcutaneous Tissue, and Slough after achieving pain control using Lidocaine 4% Topical Solution. No specimens were taken. A time out was conducted at 14:55, prior to the start of the procedure. A Minimum amount of bleeding was controlled with Pressure. The procedure was tolerated well with a pain level of 0 throughout and a pain level of 0 following the procedure. Post Debridement Measurements: 0.3cm length x 0.3cm width x 0.2cm depth;  0.014cm^3 volume. Character of Wound/Ulcer Post Debridement is improved. Severity of Tissue Post Debridement is: Fat layer exposed. Post procedure Diagnosis Wound #1: Same as Pre-Procedure Pre-procedure diagnosis of Wound #1 is a Diabetic Wound/Ulcer of the Lower Extremity located on the Left,Medial,Plantar Foot . There was a T Contact otal Cast Procedure by [name] Keeler, PA. Post procedure Diagnosis Wound #1: Same as Pre-Procedure Plan Follow-up Appointments: Return Appointment in 1 week. Dressing Change Frequency: Wound #1 Left,Medial,Plantar Foot: Do not change entire dressing for one  week. Skin Barriers/Peri-Wound Care: Moisturizing lotion - to right foot and leg daily Wound Cleansing: Wound #1 Left,Medial,Plantar Foot: May shower with protection. Primary Wound Dressing: Wound #1 Left,Medial,Plantar Foot: Silver Collagen - moisten with hydrogel to fill hole Secondary Dressing: Wound #1 Left,Medial,Plantar Foot: Foam - donut Dry Gauze Edema Control: Avoid standing for long periods of time Elevate legs to the level of the heart or above for 30 minutes daily and/or when sitting, a frequency of: Exercise regularly Support Garment 20-30 mm/Hg pressure to: - juxtalite compression garment to right leg daily Off-Loading: T Contact Cast to Left Lower Extremity otal 1. I suggest that we going to continue with the wound care measures as before and she is in agreement with this plan. This includes the use of silver collagen into the base of the wound and subsequent to this were also utilizing the foam doughnut along with dry gauze and then the total contact cast which has done extremely well for her up to this point. 2. I am also can recommend at this time the patient continue to avoid walking on her leg is much as possible. Obviously the more she does this to better off I think she will be. We will see patient back for reevaluation in 1 week here in the clinic. If anything worsens or changes patient will contact our office for additional recommendations. Electronic Signature(s) Signed: 06/12/2020 3:03:56 PM By: [name] Keeler PA-C Entered By: [name] [name] on 06/12/2020 15:03:55 -------------------------------------------------------------------------------- Total Contact Cast Details Patient Name: Date of Service: [name] [name], [name] E. 06/12/2020 1:45 PM Medical Record Number: [id number] Patient Account Number: [id number] Date of Birth/Sex: Treating RN: 1946/05/16 (74 y.o. [name] [name] Primary Care Provider: Billey [name] Other Clinician: Referring  Provider: Treating Provider/Extender: [name] [name] in Treatment: 21 T Contact Cast Applied for Wound Assessment: otal Wound #1 Left,Medial,Plantar Foot Performed By: Physician [name] Keeler, PA Post Procedure Diagnosis Same as Pre-procedure Electronic Signature(s) Signed: 06/12/2020 5:20:58 PM By: [name] Keeler PA-C Signed: 06/12/2020 5:51:45 PM By: [name] Gouty RN, BSN Entered By: [name] [name] on 06/12/2020 14:58:30 -------------------------------------------------------------------------------- SuperBill Details Patient Name: Date of Service: [name] Guarneri RBA RA E. 06/12/2020 Medical Record Number: [id number] Patient Account Number: [id number] Date of Birth/Sex: Treating RN: 01-24-1946 (74 y.o. [name] [name] Primary Care Provider: Billey [name] Other Clinician: Referring Provider: Treating Provider/Extender: [name] [name] in Treatment: 21 Diagnosis Coding ICD-10 Codes Code Description E11.621 Type 2 diabetes mellitus with foot ulcer L97.522 Non-pressure chronic ulcer of other part of left foot with fat layer exposed M14.672 Charcot's joint, left ankle and foot E11.40 Type 2 diabetes mellitus with diabetic neuropathy, unspecified I10 Essential (primary) hypertension Facility Procedures CPT4 Code: 09326712 Description: 45809 - DEB SUBQ TISSUE 20 SQ CM/< ICD-10 Diagnosis Description L97.522 Non-pressure chronic ulcer of other part of left foot with fat layer exposed Modifier: Quantity: 1 Physician Procedures : CPT4 Code Description Modifier [id number] - WC PHYS SUBQ  TISS 20 SQ CM ICD-10 Diagnosis Description L97.522 Non-pressure chronic ulcer of other part of left foot with fat layer exposed Quantity: 1 Electronic Signature(s) Signed: 06/12/2020 3:04:18 PM By: [name] Keeler PA-C Entered By: [name] [name] on 06/12/2020 15:04:16

## 2020-06-13 NOTE — Progress Notes (Signed)
DAYANNE, YIU ([id number]) Visit Report for 06/12/2020 Arrival Information Details Patient Name: Date of Service: ELDINE, RENCHER RA E. 06/12/2020 1:45 PM Medical Record Number: [id number] Patient Account Number: [id number] Date of Birth/Sex: Treating RN: 1945-09-18 (74 y.o. [name], [name] Primary Care Marionette Meskill: [name] Other Clinician: Referring Cassondra Stachowski: Treating Harshith Pursell/Extender: Adele Schilder in Treatment: 21 Visit Information History Since Last Visit Added or deleted any medications: No Patient Arrived: Wheel Chair Any new allergies or adverse reactions: No Arrival Time: 14:17 Had a fall or experienced change in No Accompanied By: self activities of daily living that may affect Transfer Assistance: None risk of falls: Patient Identification Verified: Yes Signs or symptoms of abuse/neglect since last visito No Secondary Verification Process Completed: Yes Hospitalized since last visit: No Patient Requires Transmission-Based Precautions: No Implantable device outside of the clinic excluding No Patient Has Alerts: No cellular tissue based products placed in the center since last visit: Has Dressing in Place as Prescribed: Yes Pain Present Now: No Electronic Signature(s) Signed: 06/13/2020 9:07:27 AM By: Sandre Kitty Entered By: Sandre Kitty on 06/12/2020 14:19:18 -------------------------------------------------------------------------------- Encounter Discharge Information Details Patient Name: Date of Service: [name] 06/12/2020 1:45 PM Medical Record Number: [id number] Patient Account Number: [id number] Date of Birth/Sex: Treating RN: June 26, 1946 (74 y.o. [name] Primary Care Michalene Debruler: [name] Other Clinician: Referring Kassidy Frankson: Treating Sharmel Ballantine/Extender: Adele Schilder in Treatment: 21 Encounter Discharge Information Items Post Procedure Vitals Discharge Condition: Stable Temperature  (F): 98.6 Ambulatory Status: Wheelchair Pulse (bpm): 92 Discharge Destination: Home Respiratory Rate (breaths/min): 18 Transportation: Private Auto Blood Pressure (mmHg): 142/77 Accompanied By: self Schedule Follow-up Appointment: Yes Clinical Summary of Care: Electronic Signature(s) Signed: 06/12/2020 5:31:28 PM By: Deon Pilling Entered By: Deon Pilling on 06/12/2020 16:35:40 -------------------------------------------------------------------------------- Lower Extremity Assessment Details Patient Name: Date of Service: AKAYLAH, LALLEY RA E. 06/12/2020 1:45 PM Medical Record Number: [id number] Patient Account Number: [id number] Date of Birth/Sex: Treating RN: February 05, 1946 (74 y.o. [name] Primary Care Bereket Gernert: [name] Other Clinician: Referring Laiba Fuerte: Treating Suttyn Cryder/Extender: Landis Martins Weeks in Treatment: 21 Edema Assessment Assessed: [Left: Yes] [Right: No] Edema: [Left: N] [Right: o] Calf Left: Right: Point of Measurement: 35 cm From Medial Instep 34 cm Ankle Left: Right: Point of Measurement: 9 cm From Medial Instep 23 cm Vascular Assessment Pulses: Dorsalis Pedis Palpable: [Left:Yes] Electronic Signature(s) Signed: 06/12/2020 5:31:28 PM By: Deon Pilling Entered By: Deon Pilling on 06/12/2020 14:42:31 -------------------------------------------------------------------------------- [place] Details Patient Name: Date of Service: [name] 06/12/2020 1:45 PM Medical Record Number: [id number] Patient Account Number: [id number] Date of Birth/Sex: Treating RN: 1946/02/10 (74 y.o. [name] Primary Care Kalani Sthilaire: [name] Other Clinician: Referring Zaccheus Edmister: Treating Kseniya Grunden/Extender: Adele Schilder in Treatment: 21 Active Inactive Nutrition Nursing Diagnoses: Impaired glucose control: actual or potential Potential for alteratiion in Nutrition/Potential for  imbalanced nutrition Goals: Patient/caregiver verbalizes understanding of need to maintain therapeutic glucose control per primary care physician Date Initiated: 01/17/2020 Date Inactivated: 02/14/2020 Target Resolution Date: 02/14/2020 Goal Status: Met Patient/caregiver will maintain therapeutic glucose control Date Initiated: 01/17/2020 Target Resolution Date: 07/03/2020 Goal Status: Active Interventions: Assess HgA1c results as ordered upon admission and as needed Assess patient nutrition upon admission and as needed per policy Provide education on elevated blood sugars and impact on wound healing Treatment Activities: Patient referred to Primary Care Physician for further nutritional evaluation : 01/17/2020 Notes: Wound/Skin Impairment Nursing Diagnoses: Impaired  tissue integrity Knowledge deficit related to ulceration/compromised skin integrity Goals: Patient/caregiver will verbalize understanding of skin care regimen Date Initiated: 01/17/2020 Target Resolution Date: 07/03/2020 Goal Status: Active Ulcer/skin breakdown will have a volume reduction of 30% by week 4 Date Initiated: 01/17/2020 Date Inactivated: 02/14/2020 Target Resolution Date: 02/14/2020 Goal Status: Met Ulcer/skin breakdown will have a volume reduction of 50% by week 8 Date Initiated: 02/14/2020 Date Inactivated: 03/13/2020 Target Resolution Date: 03/13/2020 Goal Status: Met Ulcer/skin breakdown will have a volume reduction of 80% by week 12 Date Initiated: 03/13/2020 Date Inactivated: 04/10/2020 Target Resolution Date: 04/10/2020 Goal Status: Unmet Unmet Reason: infection Interventions: Assess patient/caregiver ability to obtain necessary supplies Assess patient/caregiver ability to perform ulcer/skin care regimen upon admission and as needed Assess ulceration(s) every visit Provide education on ulcer and skin care Treatment Activities: Skin care regimen initiated : 01/17/2020 Topical wound management initiated :  01/17/2020 Notes: Electronic Signature(s) Signed: 06/12/2020 5:51:45 PM By: Zenaida Deed RN, BSN Entered By: Zenaida Deed on 06/12/2020 14:56:24 -------------------------------------------------------------------------------- Pain Assessment Details Patient Name: Date of Service: Delton [place] RA E. 06/12/2020 1:45 PM Medical Record Number: [id number] Patient Account Number: [id number] Date of Birth/Sex: Treating RN: Jan 24, 1946 (74 y.o. [name] Primary Care Britne Borelli: Cheryll Cockayne Other Clinician: Referring Jennefer Kopp: Treating Melo Stauber/Extender: Rickard Patience in Treatment: 21 Active Problems Location of Pain Severity and Description of Pain Patient Has Paino No Site Locations Pain Management and Medication Current Pain Management: Electronic Signature(s) Signed: 06/12/2020 5:51:45 PM By: Zenaida Deed RN, BSN Signed: 06/13/2020 9:07:27 AM By: Karl Ito Entered By: Karl Ito on 06/12/2020 14:19:57 -------------------------------------------------------------------------------- Patient/Caregiver Education Details Patient Name: Date of Service: Delton [place] RA E. 11/3/2021andnbsp1:45 PM Medical Record Number: [id number] Patient Account Number: [id number] Date of Birth/Gender: Treating RN: Apr 13, 1946 (74 y.o. [name] Primary Care Physician: Cheryll Cockayne Other Clinician: Referring Physician: Treating Physician/Extender: Rickard Patience in Treatment: 21 Education Assessment Education Provided To: Patient Education Topics Provided Elevated Blood Sugar/ Impact on Healing: Methods: Explain/Verbal Responses: Reinforcements needed, State content correctly Offloading: Methods: Explain/Verbal Responses: Reinforcements needed, State content correctly Wound/Skin Impairment: Methods: Explain/Verbal Responses: Reinforcements needed, State content correctly Electronic Signature(s) Signed: 06/12/2020 5:51:45  PM By: Zenaida Deed RN, BSN Entered By: Zenaida Deed on 06/12/2020 14:57:31 -------------------------------------------------------------------------------- Wound Assessment Details Patient Name: Date of Service: Delton [place] RA E. 06/12/2020 1:45 PM Medical Record Number: [id number] Patient Account Number: [id number] Date of Birth/Sex: Treating RN: 21-Feb-1946 (74 y.o. [name] Primary Care Berkeley Veldman: Cheryll Cockayne Other Clinician: Referring Lyly Canizales: Treating Netasha Wehrli/Extender: Atlee Abide Weeks in Treatment: 21 Wound Status Wound Number: 1 Primary Diabetic Wound/Ulcer of the Lower Extremity Etiology: Wound Location: Left, Medial, Plantar Foot Wound Open Wounding Event: Gradually Appeared Status: Date Acquired: 05/11/2019 Comorbid Sleep Apnea, Deep Vein Thrombosis, Hypertension, Colitis, Type Weeks Of Treatment: 21 History: II Diabetes, Gout, Osteoarthritis, Neuropathy Clustered Wound: No Wound Measurements Length: (cm) 0.2 Width: (cm) 0.2 Depth: (cm) 0.2 Area: (cm) 0.031 Volume: (cm) 0.006 % Reduction in Area: 97% % Reduction in Volume: 98.5% Epithelialization: Large (67-100%) Tunneling: No Undermining: No Wound Description Classification: Grade 2 Wound Margin: Well defined, not attached Exudate Amount: Small Exudate Type: Serous Exudate Color: amber Foul Odor After Cleansing: No Slough/Fibrino No Wound Bed Granulation Amount: Large (67-100%) Exposed Structure Granulation Quality: Pink Fascia Exposed: No Necrotic Amount: None Present (0%) Fat Layer (Subcutaneous Tissue) Exposed: Yes Tendon Exposed: No Muscle Exposed: No Joint Exposed: No Bone Exposed: No Assessment Notes thin  layer of callus to periwound noted. Treatment Notes Wound #1 (Left, Medial, Plantar Foot) 1. Cleanse With Wound Cleanser Soap and water 2. Periwound Care Skin Prep 3. Primary Dressing Applied Colle[employer] 4. Secondary Dressing Dry Gauze Foam 5.  Secured With Medipore tape 7. Footwear/Offloading device applied T Contact Cast otal Notes foam donut as secondary. Last layer of TCC applied by MD. Electronic Signature(s) Signed: 06/12/2020 5:31:28 PM By: Deon Pilling Signed: 06/12/2020 5:51:45 PM By: [name] RN, BSN Entered By: Deon Pilling on 06/12/2020 14:43:15 -------------------------------------------------------------------------------- Vitals Details Patient Name: Date of Service: Thalmann, [name] 06/12/2020 1:45 PM Medical Record Number: [id number] Patient Account Number: [id number] Date of Birth/Sex: Treating RN: May 26, 1946 (74 y.o. [name] Primary Care Zaven Klemens: [name] Other Clinician: Referring Lacresia Darwish: Treating Sloka Volante/Extender: Adele Schilder in Treatment: 21 Vital Signs Time Taken: 14:19 Temperature (F): 98.6 Height (in): 66 Pulse (bpm): 92 Weight (lbs): 245 Respiratory Rate (breaths/min): 18 Body Mass Index (BMI): 39.5 Blood Pressure (mmHg): 142/77 Reference Range: 80 - 120 mg / dl Electronic Signature(s) Signed: 06/13/2020 9:07:27 AM By: Sandre Kitty Entered By: Sandre Kitty on 06/12/2020 14:19:42

## 2020-06-15 ENCOUNTER — Other Ambulatory Visit: Payer: Self-pay | Admitting: Internal Medicine

## 2020-06-17 ENCOUNTER — Encounter: Payer: Self-pay | Admitting: *Deleted

## 2020-06-17 ENCOUNTER — Telehealth: Payer: Self-pay | Admitting: *Deleted

## 2020-06-17 NOTE — Telephone Encounter (Signed)
OV with Dr. Claiborne Billings 10/15  Download obtained and reviewed by Dr. Claiborne Billings. EPAP min increased from 9 to 10 Completed in care orchestrator  Message sent to patient to make aware.

## 2020-06-19 ENCOUNTER — Encounter (HOSPITAL_BASED_OUTPATIENT_CLINIC_OR_DEPARTMENT_OTHER): Payer: Medicare Other | Admitting: Physician Assistant

## 2020-06-19 ENCOUNTER — Other Ambulatory Visit: Payer: Self-pay

## 2020-06-19 DIAGNOSIS — E11621 Type 2 diabetes mellitus with foot ulcer: Secondary | ICD-10-CM | POA: Diagnosis not present

## 2020-06-19 DIAGNOSIS — E1140 Type 2 diabetes mellitus with diabetic neuropathy, unspecified: Secondary | ICD-10-CM | POA: Diagnosis not present

## 2020-06-19 DIAGNOSIS — I10 Essential (primary) hypertension: Secondary | ICD-10-CM | POA: Diagnosis not present

## 2020-06-19 DIAGNOSIS — L97522 Non-pressure chronic ulcer of other part of left foot with fat layer exposed: Secondary | ICD-10-CM | POA: Diagnosis not present

## 2020-06-19 MED ORDER — METFORMIN HCL ER 500 MG PO TB24: 1000.0000 mg | ORAL_TABLET | Freq: Two times a day (BID) | ORAL | 3 refills | Status: DC

## 2020-06-19 NOTE — Progress Notes (Addendum)
[name] [name], [name] [name] ([id number]) Visit Report for 06/19/2020 Chief Complaint Document Details Patient Name: Date of Service: [name] [name], [name] [name] 06/19/2020 2:15 PM Medical Record Number: [id number] Patient Account Number: [id number] Date of Birth/Sex: Treating RN: Dec 19, 1945 (74 y.o. [name] Other Clinician: Referring Provider: Treating Provider/Extender: Adele Schilder in Treatment: 22 Information Obtained from: Patient Chief Complaint Left foot ulcer Electronic Signature(s) Signed: 06/19/2020 2:43:07 PM By: Worthy Keeler PA-C Entered By: Worthy Keeler on 06/19/2020 14:43:07 -------------------------------------------------------------------------------- HPI Details Patient Name: Date of Service: [name] [name], [name] Creek RBA RA E. 06/19/2020 2:15 PM Medical Record Number: [id number] Patient Account Number: [id number] Date of Birth/Sex: Treating RN: 1946/03/16 (74 y.o. [name] Other Clinician: Referring Provider: Treating Provider/Extender: Adele Schilder in Treatment: 22 History of Present Illness HPI Description: 01/17/2020 upon evaluation today patient presents for initial evaluation here in our clinic concerning issues she has been having with a left medial/plantar foot ulcer. This is actually been an issue for her since October 2020. She has been seeing Dr. Doran Durand for quite some time during that course. Fortunately there is no signs of active infection at this time. Or least no mention of this to have seen in general. With that being said unfortunately I do see some signs of erythema noted today that does have me concerned about the possibility of infection at this point in the surrounding area of the wound. There is also a warm to touch at the site which is somewhat concerning. Fortunately there is no evidence of systemic infection which is great news. The patient  does have a history of diabetes mellitus type 2, Charcot foot which is what led to the wound, and hypertension. She notes that she was in a cast for some time with Dr. Doran Durand for about 8 weeks. During that time they were utilizing according to the patient silver nitrate along with a foam doughnut and then Coban to secure in place in the cast in place. With that being said I do not have the actual records to review we are going to try to get a hold of those unfortunately they would not flow over into care everywhere I did look today. She has been seeing Dr. Doran Durand and his physician assistant Larkin Ina up until the end of May and apparently is still seeing them on a regular basis every 2 weeks roughly. She has also tried Iodosorb without effect here. 01/24/2020 upon evaluation today patient actually appears to be doing quite well with regard to her wounds. She has been tolerating the dressing changes without complication. Fortunately there is no signs of active infection spreading which is good news. Her culture did show signs of Staph aureus I did place her on Augmentin due to the erythema surrounding the wound. With that being said the wound does appear to be doing better she has her longer walking cast/boot and I think that is actually good for her for the time being. I am considering reinitiating total contact cast when she gets back from vacation but next week she will actually be out of town at [employer] she knows not to get in the water but she still obviously is planning to enjoy herself she is going to take it easy on her foot however. 02/07/2020 upon evaluation today patient appears to be doing fairly well in regard to her ulcer on her foot. Fortunately there is no signs of  severe infection at this time which is great news and overall very pleased in that regard. With that being said I do think that she could still benefit from a total contact cast. Nonetheless she is using her walking boot which at  least provide some protection and that it prevents some of the friction occurring when she is ambulating. 02/14/2020 upon evaluation today patient appears to be doing well with regard to her foot ulcer. This is actually measuring a little bit smaller yet again this week. Overall very pleased with where things stand and I do not see any signs of active infection at this time which is also good news. Since she is measuring better the patient has wanting to somewhat hold off on proceeding with the total contact cast which I think is reasonable at this point. 02/28/2020 on evaluation today patient appears to be doing well in general in regard to her wound although she has a lot of callus buildup as compared to last time I saw her. This is can require sharp debridement today. I do believe she really needs the total contact cast as well which we have discussed previous. 7/23; patient comes in for a total contact cast change 03/06/2020 on evaluation today patient appears to be doing quite well with regard to her wounds. Fortunately the wound bed is measuring smaller and looking much better there is little callus noted although there is some debridement necessary today. 03/13/2020 on evaluation today patient's wound actually appears to be doing excellent which is great news. With that being said unfortunately she is having some issues currently with her left leg where she does have cellulitis it appears. This may have come from an area that rubbed underneath the cast from last week that we noted we padded that area and it looks to be doing excellent at this point but nonetheless the leg was somewhat painful, swollen, and somewhat erythematous. She also had an elevated white blood cell count of 11.5 based on what I saw on looking at her records from the med center in [place] [facility] from where she was seen yesterday. Unfortunately with [place] having a provider on vacation there was no one here in the clinic in the afternoon  when she called therefore she went to the ER as advised. Subsequently they did not cut off the cast as they did not have anyone from orthopedics there to do so and subsequently also did not have the ability to do the Doppler for evaluation of DVT They recommended therefore given her dose of Eliquis as well as . Augmentin and sent her home to come see [place] today to have the cast taken off and then she is supposed to go back to have the study for DVT performed they are following. 03/20/2020 upon evaluation today patient appears to be doing well with regard to her foot all things considered we have not been able to use the total contact cast due to the infection that she had last week. She has been on the doxycycline and she had a 10-day supply of that I do believe that is helping and her leg appears to be doing better. With that being said there is fortunately no signs of active infection systemically at this time which is good news. No fevers, chills, nausea, vomiting, or diarrhea. 03/27/2020 upon evaluation today patient appears to be doing well with regard to her foot ulcer. There does not appear to be signs of active infection which is great news. Overall I  am very pleased with where things stand at this point. 04/03/2020 upon evaluation today patient appears to be doing pretty well in regard to the overall appearance of her wound. Fortunately there is no signs of active infection at this time which is great news. No fevers, chills, nausea, vomiting, or diarrhea. With that being said she does have some blue-green drainage that actually is a little bit concerning to me for the possibility of Pseudomonas. I discussed that with the patient today. With that being said I do believe that we may be able to manage this however with the topical antibiotic cream as opposed to having to do anything oral especially since she seems to be doing so well with overall appearance of the wound. 04/10/2020 on evaluation  today patient appears to be doing about the same roughly in regard to the overall size of her wound. With that being said she fortunately has not shown any signs of worsening overall which is good news. I do believe that she is doing a great job trying to offload but again she may still do better with the cast. I do not see in the blue-green drainage that we noticed previously I do believe the gentamicin help in this regard. 04/17/2020 on evaluation today patient's wound appears to be doing about the same at this point. There is no significant improvement at this point. No fever chills noted. She is up for put the cast back on the day. That she states in a couple weeks she will need to have this off to go to a workshop. 04/24/2020 on evaluation today patient appears to be doing significantly better in regard to her wound. Fortunately there is no signs of active infection and overall feel like she is making great progress the cast seems to have done excellent for her. 05/01/2020 upon evaluation today patient presents for reevaluation she really does not appear to be doing too badly in regard to the actual wound on the left foot we have been managing. Unfortunately she has bilateral lower extremity edema with blisters between the webspace of her first and second toe on both feet. She has a tremendous amount of edema in the legs which I think is where this is coming from it does not appear to be infected but nonetheless I do believe this is can be something that needs to be addressed today. Obviously this means we probably will not be putting the cast on at this point. She attributes this to the fact that she was sitting with her feet on the floor much longer during a conference last week she had a great time but unfortunately had a lot of complications as a result. 05/08/2020 upon evaluation today patient appears to be doing somewhat better in regard to her wounds at this time. Fortunately there is no signs of  active infection which is great news. With that being said I do believe that the blisters have ruptured and unfortunately did not just reattach I will remove some of the blistered tissue today. With that being said I do think the wound itself on the plantar aspect of left foot does need to have sharp debridement. 05/15/2020 upon evaluation today patient appears to be doing about the same in regard to her foot ulcer. Unfortunately in the past week her husband had a fall where he sustained a mild traumatic brain bleed. Fortunately he is doing better but being that he was in the hospital she had a walk on this a lot more.  The wound does not appear to be any better is also not really appearing to be significantly worse which is good news. There is no signs of active infection at this time. 10/14; patient with a small diabetic wound on the medial part of her left foot. We have been using silver collagen a total contact cast making good progress. I think the patient had a series of blisters on her dorsal foot probably secondary to having her legs recumbent for 3 days while in a conference in [place]. We wrapped her leg last week these are all healed. We did not previously have her in compression on the right leg. 05/29/2020 upon evaluation today patient appears to be doing well with regard to the wound on the plantar aspect of her foot medially. This is measuring smaller and looking much better than last time I saw her. Again when I did see her last was 2 weeks back and the wound was significantly larger. I do believe the cast is helping and I believe the collagen is a good option for her. 06/05/2020 on evaluation today patient appears to be doing well with regard to her foot ulcer this is actually measuring significantly better and overall I feel like she is doing excellent. There is no signs of active infection at this time. 06/12/2020 upon evaluation today patient actually continues to show signs of good  improvement which is excellent news. I am extremely pleased with how she seems to be progressing at this point in regard to her wound. There is still some depth to the wound but I do believe the collagen is helping her quite a bit. 06/19/2020 upon evaluation today patient appears to be doing well with regard to her plantar foot ulcer. She is actually making excellent progress and in fact this appears to be almost completely healed. With that being said I do believe that the patient is going to actually require 1 more week in the cast although after that I am hopeful she will be ready for discharge. Electronic Signature(s) Signed: 06/19/2020 5:37:02 PM By: Worthy Keeler PA-C Entered By: Worthy Keeler on 06/19/2020 17:37:02 -------------------------------------------------------------------------------- Physical Exam Details Patient Name: Date of Service: [name] [name], [name] RA E. 06/19/2020 2:15 PM Medical Record Number: [id number] Patient Account Number: [id number] Date of Birth/Sex: Treating RN: September 24, 1945 (74 y.o. [name]: Other Clinician: [name] Referring Provider: Treating Provider/Extender: Landis Martins Weeks in Treatment: 25 Constitutional Well-nourished and well-hydrated in no acute distress. Respiratory normal breathing without difficulty. Psychiatric this patient is able to make decisions and demonstrates good insight into disease process. Alert and Oriented x 3. pleasant and cooperative. Notes Upon inspection patient's wound did require some sharp debridement clear away some callus, drainage, and dry skin around the edges of the wound. She tolerated all that today without complication. With that being said post debridement there was a 0.1 x 0.1 x 0.1 cm wound noted that it actually appears to be doing excellent I think this will probably completely healed come next week. Electronic Signature(s) Signed: 06/19/2020 5:37:30 PM  By: Worthy Keeler PA-C Entered By: Worthy Keeler on 06/19/2020 17:37:30 -------------------------------------------------------------------------------- Physician Orders Details Patient Name: Date of Service: [name] [name], [name] RA E. 06/19/2020 2:15 PM Medical Record Number: [id number] Patient Account Number: [id number] Date of Birth/Sex: Treating RN: 10-19-1945 (74 y.o. [name] Other Clinician: Referring Provider: Treating Provider/Extender: Landis Martins Weeks in Treatment: 22  Verbal / Phone Orders: No Diagnosis Coding ICD-10 Coding Code Description E11.621 Type 2 diabetes mellitus with foot ulcer L97.522 Non-pressure chronic ulcer of other part of left foot with fat layer exposed M14.672 Charcot's joint, left ankle and foot E11.40 Type 2 diabetes mellitus with diabetic neuropathy, unspecified I10 Essential (primary) hypertension Follow-up Appointments Return Appointment in 1 week. Dressing Change Frequency Wound #1 Left,Medial,Plantar Foot Do not change entire dressing for one week. Skin Barriers/Peri-Wound Care Moisturizing lotion - to right foot and leg daily Wound Cleansing Wound #1 Left,Medial,Plantar Foot May shower with protection. Primary Wound Dressing Wound #1 Left,Medial,Plantar Foot Calcium Alginate Secondary Dressing Wound #1 Left,Medial,Plantar Foot Foam - donut Dry Gauze Edema Control Avoid standing for long periods of time Elevate legs to the level of the heart or above for 30 minutes daily and/or when sitting, a frequency of: Exercise regularly Support Garment 20-30 mm/Hg pressure to: - juxtalite compression garment to right leg daily Off-Loading Total Contact Cast to Left Lower Extremity Electronic Signature(s) Signed: 06/19/2020 5:47:40 PM By: Worthy Keeler PA-C Signed: 06/19/2020 5:57:36 PM By: [name] RN, BSN Entered By: [name] on 06/19/2020  15:34:32 -------------------------------------------------------------------------------- Problem List Details Patient Name: Date of Service: [name] Amato RA E. 06/19/2020 2:15 PM Medical Record Number: [id number] Patient Account Number: [id number] Date of Birth/Sex: Treating RN: 10-21-1945 (74 y.o. [name] Other Clinician: Referring Provider: Treating Provider/Extender: Adele Schilder in Treatment: 22 Active Problems ICD-10 Encounter Code Description Active Date MDM Diagnosis E11.621 Type 2 diabetes mellitus with foot ulcer 01/17/2020 No Yes L97.522 Non-pressure chronic ulcer of other part of left foot with fat layer exposed 01/17/2020 No Yes M14.672 Charcot's joint, left ankle and foot 01/17/2020 No Yes E11.40 Type 2 diabetes mellitus with diabetic neuropathy, unspecified 01/17/2020 No Yes I10 Essential (primary) hypertension 01/17/2020 No Yes Inactive Problems Resolved Problems Electronic Signature(s) Signed: 06/19/2020 2:42:49 PM By: Worthy Keeler PA-C Entered By: Worthy Keeler on 06/19/2020 14:42:48 -------------------------------------------------------------------------------- Progress Note Details Patient Name: Date of Service: [name] [name], [name] RA E. 06/19/2020 2:15 PM Medical Record Number: [id number] Patient Account Number: [id number] Date of Birth/Sex: Treating RN: [birth year] (74 y.o. [name] Other Clinician: Referring Provider: Treating Provider/Extender: Adele Schilder in Treatment: 22 Subjective Chief Complaint Information obtained from Patient Left foot ulcer History of Present Illness (HPI) 01/17/2020 upon evaluation today patient presents for initial evaluation here in our clinic concerning issues she has been having with a left medial/plantar foot ulcer. This is actually been an issue for her since October 2020. She has been seeing  Dr. Doran Durand for quite some time during that course. Fortunately there is no signs of active infection at this time. Or least no mention of this to have seen in general. With that being said unfortunately I do see some signs of erythema noted today that does have me concerned about the possibility of infection at this point in the surrounding area of the wound. There is also a warm to touch at the site which is somewhat concerning. Fortunately there is no evidence of systemic infection which is great news. The patient does have a history of diabetes mellitus type 2, Charcot foot which is what led to the wound, and hypertension. She notes that she was in a cast for some time with Dr. Doran Durand for about 8 weeks. During that time they were utilizing according to the patient silver nitrate along  with a foam doughnut and then Coban to secure in place in the cast in place. With that being said I do not have the actual records to review we are going to try to get a hold of those unfortunately they would not flow over into care everywhere I did look today. She has been seeing Dr. Doran Durand and his physician assistant Larkin Ina up until the end of May and apparently is still seeing them on a regular basis every 2 weeks roughly. She has also tried Iodosorb without effect here. 01/24/2020 upon evaluation today patient actually appears to be doing quite well with regard to her wounds. She has been tolerating the dressing changes without complication. Fortunately there is no signs of active infection spreading which is good news. Her culture did show signs of Staph aureus I did place her on Augmentin due to the erythema surrounding the wound. With that being said the wound does appear to be doing better she has her longer walking cast/boot and I think that is actually good for her for the time being. I am considering reinitiating total contact cast when she gets back from vacation but next week she will actually be out of town  at [employer] she knows not to get in the water but she still obviously is planning to enjoy herself she is going to take it easy on her foot however. 02/07/2020 upon evaluation today patient appears to be doing fairly well in regard to her ulcer on her foot. Fortunately there is no signs of severe infection at this time which is great news and overall very pleased in that regard. With that being said I do think that she could still benefit from a total contact cast. Nonetheless she is using her walking boot which at least provide some protection and that it prevents some of the friction occurring when she is ambulating. 02/14/2020 upon evaluation today patient appears to be doing well with regard to her foot ulcer. This is actually measuring a little bit smaller yet again this week. Overall very pleased with where things stand and I do not see any signs of active infection at this time which is also good news. Since she is measuring better the patient has wanting to somewhat hold off on proceeding with the total contact cast which I think is reasonable at this point. 02/28/2020 on evaluation today patient appears to be doing well in general in regard to her wound although she has a lot of callus buildup as compared to last time I saw her. This is can require sharp debridement today. I do believe she really needs the total contact cast as well which we have discussed previous. 7/23; patient comes in for a total contact cast change 03/06/2020 on evaluation today patient appears to be doing quite well with regard to her wounds. Fortunately the wound bed is measuring smaller and looking much better there is little callus noted although there is some debridement necessary today. 03/13/2020 on evaluation today patient's wound actually appears to be doing excellent which is great news. With that being said unfortunately she is having some issues currently with her left leg where she does have cellulitis it appears.  This may have come from an area that rubbed underneath the cast from last week that we noted we padded that area and it looks to be doing excellent at this point but nonetheless the leg was somewhat painful, swollen, and somewhat erythematous. She also had an elevated white blood cell count  of 11.5 based on what I saw on looking at her records from the med center in [facility] from where she was seen yesterday. Unfortunately with [place] having a provider on vacation there was no one here in the clinic in the afternoon when she called therefore she went to the ER as advised. Subsequently they did not cut off the cast as they did not have anyone from orthopedics there to do so and subsequently also did not have the ability to do the Doppler for evaluation of DVT They recommended therefore given her dose of Eliquis as well as . Augmentin and sent her home to come see [place] today to have the cast taken off and then she is supposed to go back to have the study for DVT performed they are following. 03/20/2020 upon evaluation today patient appears to be doing well with regard to her foot all things considered we have not been able to use the total contact cast due to the infection that she had last week. She has been on the doxycycline and she had a 10-day supply of that I do believe that is helping and her leg appears to be doing better. With that being said there is fortunately no signs of active infection systemically at this time which is good news. No fevers, chills, nausea, vomiting, or diarrhea. 03/27/2020 upon evaluation today patient appears to be doing well with regard to her foot ulcer. There does not appear to be signs of active infection which is great news. Overall I am very pleased with where things stand at this point. 04/03/2020 upon evaluation today patient appears to be doing pretty well in regard to the overall appearance of her wound. Fortunately there is no signs of active infection at this time  which is great news. No fevers, chills, nausea, vomiting, or diarrhea. With that being said she does have some blue-green drainage that actually is a little bit concerning to me for the possibility of Pseudomonas. I discussed that with the patient today. With that being said I do believe that we may be able to manage this however with the topical antibiotic cream as opposed to having to do anything oral especially since she seems to be doing so well with overall appearance of the wound. 04/10/2020 on evaluation today patient appears to be doing about the same roughly in regard to the overall size of her wound. With that being said she fortunately has not shown any signs of worsening overall which is good news. I do believe that she is doing a great job trying to offload but again she may still do better with the cast. I do not see in the blue-green drainage that we noticed previously I do believe the gentamicin help in this regard. 04/17/2020 on evaluation today patient's wound appears to be doing about the same at this point. There is no significant improvement at this point. No fever chills noted. She is up for put the cast back on the day. That she states in a couple weeks she will need to have this off to go to a workshop. 04/24/2020 on evaluation today patient appears to be doing significantly better in regard to her wound. Fortunately there is no signs of active infection and overall feel like she is making great progress the cast seems to have done excellent for her. 05/01/2020 upon evaluation today patient presents for reevaluation she really does not appear to be doing too badly in regard to the actual wound on the  left foot we have been managing. Unfortunately she has bilateral lower extremity edema with blisters between the webspace of her first and second toe on both feet. She has a tremendous amount of edema in the legs which I think is where this is coming from it does not appear to be infected  but nonetheless I do believe this is can be something that needs to be addressed today. Obviously this means we probably will not be putting the cast on at this point. She attributes this to the fact that she was sitting with her feet on the floor much longer during a conference last week she had a great time but unfortunately had a lot of complications as a result. 05/08/2020 upon evaluation today patient appears to be doing somewhat better in regard to her wounds at this time. Fortunately there is no signs of active infection which is great news. With that being said I do believe that the blisters have ruptured and unfortunately did not just reattach I will remove some of the blistered tissue today. With that being said I do think the wound itself on the plantar aspect of left foot does need to have sharp debridement. 05/15/2020 upon evaluation today patient appears to be doing about the same in regard to her foot ulcer. Unfortunately in the past week her husband had a fall where he sustained a mild traumatic brain bleed. Fortunately he is doing better but being that he was in the hospital she had a walk on this a lot more. The wound does not appear to be any better is also not really appearing to be significantly worse which is good news. There is no signs of active infection at this time. 10/14; patient with a small diabetic wound on the medial part of her left foot. We have been using silver collagen a total contact cast making good progress. I think the patient had a series of blisters on her dorsal foot probably secondary to having her legs recumbent for 3 days while in a conference in [place]. We wrapped her leg last week these are all healed. We did not previously have her in compression on the right leg. 05/29/2020 upon evaluation today patient appears to be doing well with regard to the wound on the plantar aspect of her foot medially. This is measuring smaller and looking much better than  last time I saw her. Again when I did see her last was 2 weeks back and the wound was significantly larger. I do believe the cast is helping and I believe the collagen is a good option for her. 06/05/2020 on evaluation today patient appears to be doing well with regard to her foot ulcer this is actually measuring significantly better and overall I feel like she is doing excellent. There is no signs of active infection at this time. 06/12/2020 upon evaluation today patient actually continues to show signs of good improvement which is excellent news. I am extremely pleased with how she seems to be progressing at this point in regard to her wound. There is still some depth to the wound but I do believe the collagen is helping her quite a bit. 06/19/2020 upon evaluation today patient appears to be doing well with regard to her plantar foot ulcer. She is actually making excellent progress and in fact this appears to be almost completely healed. With that being said I do believe that the patient is going to actually require 1 more week in the cast although after that  I am hopeful she will be ready for discharge. Objective Constitutional Well-nourished and well-hydrated in no acute distress. Vitals Time Taken: 2:41 PM, Height: 66 in, Weight: 245 lbs, BMI: 39.5, Temperature: 99.1 F, Pulse: 86 bpm, Respiratory Rate: 18 breaths/min, Blood Pressure: 142/78 mmHg. Respiratory normal breathing without difficulty. Psychiatric this patient is able to make decisions and demonstrates good insight into disease process. Alert and Oriented x 3. pleasant and cooperative. General Notes: Upon inspection patient's wound did require some sharp debridement clear away some callus, drainage, and dry skin around the edges of the wound. She tolerated all that today without complication. With that being said post debridement there was a 0.1 x 0.1 x 0.1 cm wound noted that it actually appears to be doing excellent I think this  will probably completely healed come next week. Integumentary (Hair, Skin) Wound #1 status is Open. Original cause of wound was Gradually Appeared. The wound is located on the [place]. The wound measures 0.1cm length x 0.1cm width x 0.1cm depth; 0.008cm^2 area and 0.001cm^3 volume. There is Fat Layer (Subcutaneous Tissue) exposed. There is no tunneling or undermining noted. There is a none present amount of drainage noted. The wound margin is well defined and not attached to the wound base. There is no granulation within the wound bed. There is no necrotic tissue within the wound bed. Assessment Active Problems ICD-10 Type 2 diabetes mellitus with foot ulcer Non-pressure chronic ulcer of other part of left foot with fat layer exposed Charcot's joint, left ankle and foot Type 2 diabetes mellitus with diabetic neuropathy, unspecified Essential (primary) hypertension Procedures Wound #1 Pre-procedure diagnosis of Wound #1 is a Diabetic Wound/Ulcer of the Lower Extremity located on the Left,Medial,Plantar Foot . There was a T Contact otal Cast Procedure by Worthy Keeler, PA. Post procedure Diagnosis Wound #1: Same as Pre-Procedure Plan Follow-up Appointments: Return Appointment in 1 week. Dressing Change Frequency: Wound #1 Left,Medial,Plantar Foot: Do not change entire dressing for one week. Skin Barriers/Peri-Wound Care: Moisturizing lotion - to right foot and leg daily Wound Cleansing: Wound #1 Left,Medial,Plantar Foot: May shower with protection. Primary Wound Dressing: Wound #1 Left,Medial,Plantar Foot: Calcium Alginate Secondary Dressing: Wound #1 Left,Medial,Plantar Foot: Foam - donut Dry Gauze Edema Control: Avoid standing for long periods of time Elevate legs to the level of the heart or above for 30 minutes daily and/or when sitting, a frequency of: Exercise regularly Support Garment 20-30 mm/Hg pressure to: - juxtalite compression garment to right  leg daily Off-Loading: T Contact Cast to Left Lower Extremity otal 1. I would recommend that we go ahead and place the patient back in a total contact cast she is in agreement the plan hopefully this will be the last 1 she requires. 2. I am also can recommend at this time that the patient continue to monitor for any signs of anything worsening with the cast in place. Overall I do not expect anything. We will see patient back for reevaluation in 1 week here in the clinic. If anything worsens or changes patient will contact our office for additional recommendations. Electronic Signature(s) Signed: 06/19/2020 5:38:09 PM By: Worthy Keeler PA-C Entered By: Worthy Keeler on 06/19/2020 17:38:09 -------------------------------------------------------------------------------- Total Contact Cast Details Patient Name: Date of Service: [name] [name], [name] E. 06/19/2020 2:15 PM Medical Record Number: [id number] Patient Account Number: [id number] Date of Birth/Sex: Treating RN: [birth year] (74 y.o. [name] Other Clinician: Referring Provider: Treating Provider/Extender: Earlean Shawl,  Stacy Weeks in Treatment: 47 T Contact Cast Applied for Wound Assessment: otal Wound #1 Left,Medial,Plantar Foot Performed By: Physician Worthy Keeler, PA Post Procedure Diagnosis Same as Pre-procedure Electronic Signature(s) Signed: 06/19/2020 5:47:40 PM By: Worthy Keeler PA-C Signed: 06/19/2020 5:57:36 PM By: [name] RN, BSN Entered By: [name] on 06/19/2020 15:33:31 -------------------------------------------------------------------------------- SuperBill Details Patient Name: Date of Service: [name] Guarneri RBA RA E. 06/19/2020 Medical Record Number: [id number] Patient Account Number: [id number] Date of Birth/Sex: Treating RN: 1946-03-17 (74 y.o. [name], [name] Primary Care Provider: [name] Other Clinician: Referring  Provider: Treating Provider/Extender: Adele Schilder in Treatment: 22 Diagnosis Coding ICD-10 Codes Code Description E11.621 Type 2 diabetes mellitus with foot ulcer L97.522 Non-pressure chronic ulcer of other part of left foot with fat layer exposed M14.672 Charcot's joint, left ankle and foot E11.40 Type 2 diabetes mellitus with diabetic neuropathy, unspecified I10 Essential (primary) hypertension Facility Procedures CPT4 Code: [id number] Description: [phone] - APPLY TOTAL CONTACT LEG CAST ICD-10 Diagnosis Description L97.522 Non-pressure chronic ulcer of other part of left foot with fat layer exposed Modifier: Quantity: 1 Physician Procedures : CPT4 Code Description Modifier [id number] - WC PHYS APPLY TOTAL CONTACT CAST ICD-10 Diagnosis Description L97.522 Non-pressure chronic ulcer of other part of left foot with fat layer exposed Quantity: 1 Electronic Signature(s) Signed: 06/19/2020 5:38:37 PM By: Worthy Keeler PA-C Entered By: Worthy Keeler on 06/19/2020 17:38:36

## 2020-06-19 NOTE — Addendum Note (Signed)
Addended by: Binnie Rail on: 06/19/2020 01:02 PM   Modules accepted: Orders

## 2020-06-19 NOTE — Progress Notes (Signed)
[name] [name], [name] [name] ([id number]) Visit Report for 06/19/2020 Arrival Information Details Patient Name: Date of Service: [name] [name], [name] E. 06/19/2020 2:15 PM Medical Record Number: [id number] Patient Account Number: [id number] Date of Birth/Sex: Treating RN: [birth year] (74 y.o. [name] [name] Primary Care [name] [name]: [name] [name] Other Clinician: Referring [name] [name]: Treating [name] [name]/Extender: [name] [name] in Treatment: 22 Visit Information History Since Last Visit Added or deleted any medications: No Patient Arrived: Wheel Chair Any new allergies or adverse reactions: No Arrival Time: 14:40 Had a fall or experienced change in No Accompanied By: self activities of daily living that may affect Transfer Assistance: None risk of falls: Patient Identification Verified: Yes Signs or symptoms of abuse/neglect since last visito No Secondary Verification Process Completed: Yes Hospitalized since last visit: No Patient Requires Transmission-Based Precautions: No Implantable device outside of the clinic excluding No Patient Has Alerts: No cellular tissue based products placed in the center since last visit: Has Dressing in Place as Prescribed: Yes Has Compression in Place as Prescribed: No Has Footwear/Offloading in Place as Prescribed: Yes Left: T Contact Cast otal Pain Present Now: No Electronic Signature(s) Signed: 06/19/2020 5:59:08 PM By: [name] [name] Entered By: [name] [name] on 06/19/2020 14:49:03 -------------------------------------------------------------------------------- Lower Extremity Assessment Details Patient Name: Date of Service: KARMELA, BRAM RA E. 06/19/2020 2:15 PM Medical Record Number: [id number] Patient Account Number: [id number] Date of Birth/Sex: Treating RN: 21-Oct-1945 (74 y.o. [name] [name] Primary Care Orlinda Slomski: [name] [name] Other Clinician: Referring [name] [name]: Treating Nicco Reaume/Extender: [name] [name] Weeks in  Treatment: 22 Edema Assessment Assessed: [Left: Yes] [Right: No] Edema: [Left: N] [Right: o] Calf Left: Right: Point of Measurement: 35 cm From Medial Instep 33.5 cm Ankle Left: Right: Point of Measurement: 9 cm From Medial Instep 22.5 cm Vascular Assessment Pulses: Dorsalis Pedis Palpable: [Left:Yes] Electronic Signature(s) Signed: 06/19/2020 5:59:08 PM By: [name] [name] Entered By: [name] [name] on 06/19/2020 14:49:49 -------------------------------------------------------------------------------- Pinch Details Patient Name: Date of Service: [name] Amato RA E. 06/19/2020 2:15 PM Medical Record Number: [id number] Patient Account Number: [id number] Date of Birth/Sex: Treating RN: May 13, 1946 (74 y.o. [name] [name] Primary Care [name] [name]: [name] [name] Other Clinician: Referring [name] [name]: Treating [name] [name]/Extender: [name] [name] in Treatment: 22 Active Inactive Wound/Skin Impairment Nursing Diagnoses: Impaired tissue integrity Knowledge deficit related to ulceration/compromised skin integrity Goals: Patient/caregiver will verbalize understanding of skin care regimen Date Initiated: 01/17/2020 Target Resolution Date: 07/03/2020 Goal Status: Active Ulcer/skin breakdown will have a volume reduction of 30% by week 4 Date Initiated: 01/17/2020 Date Inactivated: 02/14/2020 Target Resolution Date: 02/14/2020 Goal Status: Met Ulcer/skin breakdown will have a volume reduction of 50% by week 8 Date Initiated: 02/14/2020 Date Inactivated: 03/13/2020 Target Resolution Date: 03/13/2020 Goal Status: Met Ulcer/skin breakdown will have a volume reduction of 80% by week 12 Date Initiated: 03/13/2020 Date Inactivated: 04/10/2020 Target Resolution Date: 04/10/2020 Goal Status: Unmet Unmet Reason: infection Interventions: Assess patient/caregiver ability to obtain necessary supplies Assess patient/caregiver ability to perform ulcer/skin care regimen  upon admission and as needed Assess ulceration(s) every visit Provide education on ulcer and skin care Treatment Activities: Skin care regimen initiated : 01/17/2020 Topical wound management initiated : 01/17/2020 Notes: Electronic Signature(s) Signed: 06/19/2020 5:57:36 PM By: [name] Gouty RN, BSN Entered By: [name] [name] on 06/19/2020 15:30:07 -------------------------------------------------------------------------------- Pain Assessment Details Patient Name: Date of Service: [name] Amato RA E. 06/19/2020 2:15 PM Medical Record Number: [id number] Patient Account Number: [id number] Date of Birth/Sex: Treating RN: 09-Sep-1945 (74 y.o. F) [name] [name],  [name] [name] Primary Care [name] [name]: [name] [name] Other Clinician: Referring [name] [name]: Treating Jalon Blackwelder/Extender: [name] [name] in Treatment: 22 Active Problems Location of Pain Severity and Description of Pain Patient Has Paino No Site Locations Rate the pain. Current Pain Level: 0 Pain Management and Medication Current Pain Management: Medication: No Cold Application: No Rest: No Massage: No Activity: No T.[name]: No Heat Application: No Leg drop or elevation: No Is the Current Pain Management Adequate: Adequate How does your wound impact your activities of daily livingo Sleep: No Bathing: No Appetite: No Relationship With Others: No Bladder Continence: No Emotions: No Bowel Continence: No Work: No Toileting: No Drive: No Dressing: No Hobbies: No Electronic Signature(s) Signed: 06/19/2020 5:59:08 PM By: [name] [name] Entered By: [name] [name] on 06/19/2020 14:49:36 -------------------------------------------------------------------------------- Patient/Caregiver Education Details Patient Name: Date of Service: [name] Amato RA E. 11/10/2021andnbsp2:15 PM Medical Record Number: [id number] Patient Account Number: [id number] Date of Birth/Gender: Treating RN: Sep 02, 1945 (74 y.o. [name] [name] Primary Care Physician: [name] [name] Other Clinician: Referring Physician: Treating Physician/Extender: [name] [name] in Treatment: 22 Education Assessment Education Provided To: Patient Education Topics Provided Offloading: Methods: Explain/Verbal Responses: Reinforcements needed, State content correctly Wound/Skin Impairment: Methods: Explain/Verbal Responses: Reinforcements needed, State content correctly Electronic Signature(s) Signed: 06/19/2020 5:57:36 PM By: [name] Gouty RN, BSN Entered By: [name] [name] on 06/19/2020 15:30:32 -------------------------------------------------------------------------------- Wound Assessment Details Patient Name: Date of Service: [name] [name], [name] RA E. 06/19/2020 2:15 PM Medical Record Number: [id number] Patient Account Number: [id number] Date of Birth/Sex: Treating RN: 1946-03-04 (74 y.o. Helene Shoe, Tammi Klippel Primary Care Ashtin Melichar: [name] [name] Other Clinician: Referring Tarvaris Puglia: Treating Yarah Fuente/Extender: [name] [name] Weeks in Treatment: 22 Wound Status Wound Number: 1 Primary Diabetic Wound/Ulcer of the Lower Extremity Etiology: Wound Location: Left, Medial, Plantar Foot Wound Open Wounding Event: Gradually Appeared Status: Date Acquired: 05/11/2019 Comorbid Sleep Apnea, Deep Vein Thrombosis, Hypertension, Colitis, Type Weeks Of Treatment: 22 History: II Diabetes, Gout, Osteoarthritis, Neuropathy Clustered Wound: No Wound Measurements Length: (cm) 0.1 Width: (cm) 0.1 Depth: (cm) 0.1 Area: (cm) 0.008 Volume: (cm) 0.001 % Reduction in Area: 99.2% % Reduction in Volume: 99.8% Epithelialization: Large (67-100%) Tunneling: No Undermining: No Wound Description Classification: Grade 2 Wound Margin: Well defined, not attached Exudate Amount: None Present Foul Odor After Cleansing: No Slough/Fibrino No Wound Bed Granulation Amount: None Present (0%) Exposed Structure Necrotic  Amount: None Present (0%) Fascia Exposed: No Fat Layer (Subcutaneous Tissue) Exposed: Yes Tendon Exposed: No Muscle Exposed: No Joint Exposed: No Bone Exposed: No Electronic Signature(s) Signed: 06/19/2020 5:59:08 PM By: [name] [name] Entered By: [name] [name] on 06/19/2020 14:50:16 -------------------------------------------------------------------------------- Vitals Details Patient Name: Date of Service: [name] [name], [name] RA E. 06/19/2020 2:15 PM Medical Record Number: [id number] Patient Account Number: [id number] Date of Birth/Sex: Treating RN: August 11, 1945 (74 y.o. Helene Shoe, Tammi Klippel Primary Care Vincie Linn: [name] [name] Other Clinician: Referring Mykael Batz: Treating Tavita Eastham/Extender: [name] [name] Weeks in Treatment: 22 Vital Signs Time Taken: 14:41 Temperature (F): 99.1 Height (in): 66 Pulse (bpm): 86 Weight (lbs): 245 Respiratory Rate (breaths/min): 18 Body Mass Index (BMI): 39.5 Blood Pressure (mmHg): 142/78 Reference Range: 80 - 120 mg / dl Electronic Signature(s) Signed: 06/19/2020 5:59:08 PM By: [name] [name] Entered By: [name] [name] on 06/19/2020 14:49:23

## 2020-06-20 ENCOUNTER — Encounter: Payer: Self-pay | Admitting: Internal Medicine

## 2020-06-20 NOTE — Progress Notes (Unsigned)
Outside notes received. Information abstracted. Notes sent to scan.  

## 2020-06-23 MED ORDER — CIPROFLOXACIN HCL 250 MG PO TABS
250.0000 mg | ORAL_TABLET | Freq: Two times a day (BID) | ORAL | 0 refills | Status: DC
Start: 2020-06-23 — End: 2020-07-17

## 2020-06-23 NOTE — Addendum Note (Signed)
Addended by: Binnie Rail on: 06/23/2020 08:32 AM   Modules accepted: Orders

## 2020-06-26 ENCOUNTER — Other Ambulatory Visit: Payer: Self-pay

## 2020-06-26 ENCOUNTER — Encounter (HOSPITAL_BASED_OUTPATIENT_CLINIC_OR_DEPARTMENT_OTHER): Payer: Medicare Other | Admitting: Physician Assistant

## 2020-06-26 DIAGNOSIS — E1140 Type 2 diabetes mellitus with diabetic neuropathy, unspecified: Secondary | ICD-10-CM | POA: Diagnosis not present

## 2020-06-26 DIAGNOSIS — L97522 Non-pressure chronic ulcer of other part of left foot with fat layer exposed: Secondary | ICD-10-CM | POA: Diagnosis not present

## 2020-06-26 DIAGNOSIS — I10 Essential (primary) hypertension: Secondary | ICD-10-CM | POA: Diagnosis not present

## 2020-06-26 DIAGNOSIS — E11621 Type 2 diabetes mellitus with foot ulcer: Secondary | ICD-10-CM | POA: Diagnosis not present

## 2020-06-26 NOTE — Progress Notes (Addendum)
BLAYKE, PINERA ([id number]) Visit Report for 06/26/2020 Chief Complaint Document Details Patient Name: Date of Service: [name] [name], [name] E. 06/26/2020 12:30 PM Medical Record Number: [id number] Patient Account Number: [id number] Date of Birth/Sex: Treating RN: 1945/12/25 (74 y.o. [name] [name] Primary Care Provider: [name] Other Clinician: Referring Provider: Treating Provider/Extender: Adele Schilder in Treatment: 23 Information Obtained from: Patient Chief Complaint Left foot ulcer Electronic Signature(s) Signed: 06/26/2020 12:38:33 PM By: Worthy Keeler PA-C Entered By: Worthy Keeler on 06/26/2020 12:38:33 -------------------------------------------------------------------------------- HPI Details Patient Name: Date of Service: [name] [name], [name] RA E. 06/26/2020 12:30 PM Medical Record Number: [id number] Patient Account Number: [id number] Date of Birth/Sex: Treating RN: 1945/08/17 (74 y.o. [name] [name] Primary Care Provider: [name] Other Clinician: Referring Provider: Treating Provider/Extender: Adele Schilder in Treatment: 23 History of Present Illness HPI Description: 01/17/2020 upon evaluation today patient presents for initial evaluation here in our clinic concerning issues she has been having with a left medial/plantar foot ulcer. This is actually been an issue for her since October 2020. She has been seeing Dr. Doran Durand for quite some time during that course. Fortunately there is no signs of active infection at this time. Or least no mention of this to have seen in general. With that being said unfortunately I do see some signs of erythema noted today that does have me concerned about the possibility of infection at this point in the surrounding area of the wound. There is also a warm to touch at the site which is somewhat concerning. Fortunately there is no evidence of systemic infection which is great news. The patient  does have a history of diabetes mellitus type 2, Charcot foot which is what led to the wound, and hypertension. She notes that she was in a cast for some time with Dr. Doran Durand for about 8 weeks. During that time they were utilizing according to the patient silver nitrate along with a foam doughnut and then Coban to secure in place in the cast in place. With that being said I do not have the actual records to review we are going to try to get a hold of those unfortunately they would not flow over into care everywhere I did look today. She has been seeing Dr. Doran Durand and his physician assistant Larkin Ina up until the end of May and apparently is still seeing them on a regular basis every 2 weeks roughly. She has also tried Iodosorb without effect here. 01/24/2020 upon evaluation today patient actually appears to be doing quite well with regard to her wounds. She has been tolerating the dressing changes without complication. Fortunately there is no signs of active infection spreading which is good news. Her culture did show signs of Staph aureus I did place her on Augmentin due to the erythema surrounding the wound. With that being said the wound does appear to be doing better she has her longer walking cast/boot and I think that is actually good for her for the time being. I am considering reinitiating total contact cast when she gets back from vacation but next week she will actually be out of town at [employer] she knows not to get in the water but she still obviously is planning to enjoy herself she is going to take it easy on her foot however. 02/07/2020 upon evaluation today patient appears to be doing fairly well in regard to her ulcer on her foot. Fortunately there is no signs of  severe infection at this time which is great news and overall very pleased in that regard. With that being said I do think that she could still benefit from a total contact cast. Nonetheless she is using her walking boot which at  least provide some protection and that it prevents some of the friction occurring when she is ambulating. 02/14/2020 upon evaluation today patient appears to be doing well with regard to her foot ulcer. This is actually measuring a little bit smaller yet again this week. Overall very pleased with where things stand and I do not see any signs of active infection at this time which is also good news. Since she is measuring better the patient has wanting to somewhat hold off on proceeding with the total contact cast which I think is reasonable at this point. 02/28/2020 on evaluation today patient appears to be doing well in general in regard to her wound although she has a lot of callus buildup as compared to last time I saw her. This is can require sharp debridement today. I do believe she really needs the total contact cast as well which we have discussed previous. 7/23; patient comes in for a total contact cast change 03/06/2020 on evaluation today patient appears to be doing quite well with regard to her wounds. Fortunately the wound bed is measuring smaller and looking much better there is little callus noted although there is some debridement necessary today. 03/13/2020 on evaluation today patient's wound actually appears to be doing excellent which is great news. With that being said unfortunately she is having some issues currently with her left leg where she does have cellulitis it appears. This may have come from an area that rubbed underneath the cast from last week that we noted we padded that area and it looks to be doing excellent at this point but nonetheless the leg was somewhat painful, swollen, and somewhat erythematous. She also had an elevated white blood cell count of 11.5 based on what I saw on looking at her records from the med center in [place] [facility] from where she was seen yesterday. Unfortunately with [place] having a provider on vacation there was no one here in the clinic in the afternoon  when she called therefore she went to the ER as advised. Subsequently they did not cut off the cast as they did not have anyone from orthopedics there to do so and subsequently also did not have the ability to do the Doppler for evaluation of DVT They recommended therefore given her dose of Eliquis as well as . Augmentin and sent her home to come see [place] today to have the cast taken off and then she is supposed to go back to have the study for DVT performed they are following. 03/20/2020 upon evaluation today patient appears to be doing well with regard to her foot all things considered we have not been able to use the total contact cast due to the infection that she had last week. She has been on the doxycycline and she had a 10-day supply of that I do believe that is helping and her leg appears to be doing better. With that being said there is fortunately no signs of active infection systemically at this time which is good news. No fevers, chills, nausea, vomiting, or diarrhea. 03/27/2020 upon evaluation today patient appears to be doing well with regard to her foot ulcer. There does not appear to be signs of active infection which is great news. Overall I  am very pleased with where things stand at this point. 04/03/2020 upon evaluation today patient appears to be doing pretty well in regard to the overall appearance of her wound. Fortunately there is no signs of active infection at this time which is great news. No fevers, chills, nausea, vomiting, or diarrhea. With that being said she does have some blue-green drainage that actually is a little bit concerning to me for the possibility of Pseudomonas. I discussed that with the patient today. With that being said I do believe that we may be able to manage this however with the topical antibiotic cream as opposed to having to do anything oral especially since she seems to be doing so well with overall appearance of the wound. 04/10/2020 on evaluation  today patient appears to be doing about the same roughly in regard to the overall size of her wound. With that being said she fortunately has not shown any signs of worsening overall which is good news. I do believe that she is doing a great job trying to offload but again she may still do better with the cast. I do not see in the blue-green drainage that we noticed previously I do believe the gentamicin help in this regard. 04/17/2020 on evaluation today patient's wound appears to be doing about the same at this point. There is no significant improvement at this point. No fever chills noted. She is up for put the cast back on the day. That she states in a couple weeks she will need to have this off to go to a workshop. 04/24/2020 on evaluation today patient appears to be doing significantly better in regard to her wound. Fortunately there is no signs of active infection and overall feel like she is making great progress the cast seems to have done excellent for her. 05/01/2020 upon evaluation today patient presents for reevaluation she really does not appear to be doing too badly in regard to the actual wound on the left foot we have been managing. Unfortunately she has bilateral lower extremity edema with blisters between the webspace of her first and second toe on both feet. She has a tremendous amount of edema in the legs which I think is where this is coming from it does not appear to be infected but nonetheless I do believe this is can be something that needs to be addressed today. Obviously this means we probably will not be putting the cast on at this point. She attributes this to the fact that she was sitting with her feet on the floor much longer during a conference last week she had a great time but unfortunately had a lot of complications as a result. 05/08/2020 upon evaluation today patient appears to be doing somewhat better in regard to her wounds at this time. Fortunately there is no signs of  active infection which is great news. With that being said I do believe that the blisters have ruptured and unfortunately did not just reattach I will remove some of the blistered tissue today. With that being said I do think the wound itself on the plantar aspect of left foot does need to have sharp debridement. 05/15/2020 upon evaluation today patient appears to be doing about the same in regard to her foot ulcer. Unfortunately in the past week her husband had a fall where he sustained a mild traumatic brain bleed. Fortunately he is doing better but being that he was in the hospital she had a walk on this a lot more.  The wound does not appear to be any better is also not really appearing to be significantly worse which is good news. There is no signs of active infection at this time. 10/14; patient with a small diabetic wound on the medial part of her left foot. We have been using silver collagen a total contact cast making good progress. I think the patient had a series of blisters on her dorsal foot probably secondary to having her legs recumbent for 3 days while in a conference in [place]. We wrapped her leg last week these are all healed. We did not previously have her in compression on the right leg. 05/29/2020 upon evaluation today patient appears to be doing well with regard to the wound on the plantar aspect of her foot medially. This is measuring smaller and looking much better than last time I saw her. Again when I did see her last was 2 weeks back and the wound was significantly larger. I do believe the cast is helping and I believe the collagen is a good option for her. 06/05/2020 on evaluation today patient appears to be doing well with regard to her foot ulcer this is actually measuring significantly better and overall I feel like she is doing excellent. There is no signs of active infection at this time. 06/12/2020 upon evaluation today patient actually continues to show signs of good  improvement which is excellent news. I am extremely pleased with how she seems to be progressing at this point in regard to her wound. There is still some depth to the wound but I do believe the collagen is helping her quite a bit. 06/19/2020 upon evaluation today patient appears to be doing well with regard to her plantar foot ulcer. She is actually making excellent progress and in fact this appears to be almost completely healed. With that being said I do believe that the patient is going to actually require 1 more week in the cast although after that I am hopeful she will be ready for discharge. 06/26/2020 on evaluation today patient appears to be doing well in regard to her wound currently. Fortunately there is no signs of active infection in general I feel like she is doing excellent. This appears to be completely healed I think she is ready to come out of the cast. Electronic Signature(s) Signed: 06/26/2020 1:48:13 PM By: Worthy Keeler PA-C Entered By: Worthy Keeler on 06/26/2020 13:48:13 -------------------------------------------------------------------------------- Physical Exam Details Patient Name: Date of Service: [name] [name], [name] RA E. 06/26/2020 12:30 PM Medical Record Number: [id number] Patient Account Number: [id number] Date of Birth/Sex: Treating RN: 1945/10/02 (74 y.o. [name] [name] Primary Care Provider: [name] Other Clinician: Referring Provider: Treating Provider/Extender: Landis Martins Weeks in Treatment: 50 Constitutional Well-nourished and well-hydrated in no acute distress. Respiratory normal breathing without difficulty. Psychiatric this patient is able to make decisions and demonstrates good insight into disease process. Alert and Oriented x 3. pleasant and cooperative. Notes Upon inspection patient's wound bed showed signs of complete epithelization with no open wound and overall she seems to be doing quite well and extremely pleased  with where things stand at this time. Electronic Signature(s) Signed: 06/26/2020 1:48:32 PM By: Worthy Keeler PA-C Entered By: Worthy Keeler on 06/26/2020 13:48:31 -------------------------------------------------------------------------------- Physician Orders Details Patient Name: Date of Service: [name] [name], [name] RA E. 06/26/2020 12:30 PM Medical Record Number: [id number] Patient Account Number: [id number] Date of Birth/Sex: Treating RN: 04-09-1946 (74 y.o. [name], [facility]  Provider: [name] Other Clinician: Referring Provider: Treating Provider/Extender: Adele Schilder in Treatment: 23 Verbal / Phone Orders: No Diagnosis Coding ICD-10 Coding Code Description E11.621 Type 2 diabetes mellitus with foot ulcer L97.522 Non-pressure chronic ulcer of other part of left foot with fat layer exposed M14.672 Charcot's joint, left ankle and foot E11.40 Type 2 diabetes mellitus with diabetic neuropathy, unspecified I10 Essential (primary) hypertension Discharge From [facility] Discharge from [facility] Change dressing every day. - foam callous pad to healed area daily Skin Barriers/Peri-Wound Care Moisturizing lotion - to both feet and legs daily Wound Cleansing May shower and wash wound with soap and water. Edema Control Avoid standing for long periods of time Elevate legs to the level of the heart or above for 30 minutes daily and/or when sitting, a frequency of: Exercise regularly Support Garment 20-30 mm/Hg pressure to: - juxtalite compression garment to both legs daily Electronic Signature(s) Signed: 06/26/2020 5:04:51 PM By: Worthy Keeler PA-C Signed: 06/27/2020 2:58:18 PM By: [name] RN, BSN Signed: 06/27/2020 2:58:18 PM By: [name] RN, BSN Entered By: [name] on 06/26/2020 13:40:17 -------------------------------------------------------------------------------- Problem List  Details Patient Name: Date of Service: [name] Amato RA E. 06/26/2020 12:30 PM Medical Record Number: [id number] Patient Account Number: [id number] Date of Birth/Sex: Treating RN: [birth year] (74 y.o. [name] [name] Primary Care Provider: [name] Other Clinician: Referring Provider: Treating Provider/Extender: Adele Schilder in Treatment: 23 Active Problems ICD-10 Encounter Code Description Active Date MDM Diagnosis E11.621 Type 2 diabetes mellitus with foot ulcer 01/17/2020 No Yes L97.522 Non-pressure chronic ulcer of other part of left foot with fat layer exposed 01/17/2020 No Yes M14.672 Charcot's joint, left ankle and foot 01/17/2020 No Yes E11.40 Type 2 diabetes mellitus with diabetic neuropathy, unspecified 01/17/2020 No Yes I10 Essential (primary) hypertension 01/17/2020 No Yes Inactive Problems Resolved Problems Electronic Signature(s) Signed: 06/26/2020 12:38:02 PM By: Worthy Keeler PA-C Entered By: Worthy Keeler on 06/26/2020 12:38:01 -------------------------------------------------------------------------------- Progress Note Details Patient Name: Date of Service: [name] [name], [name] 06/26/2020 12:30 PM Medical Record Number: [id number] Patient Account Number: [id number] Date of Birth/Sex: Treating RN: 1945-10-03 (74 y.o. [name] [name] Primary Care Provider: [name] Other Clinician: Referring Provider: Treating Provider/Extender: Adele Schilder in Treatment: 23 Subjective Chief Complaint Information obtained from Patient Left foot ulcer History of Present Illness (HPI) 01/17/2020 upon evaluation today patient presents for initial evaluation here in our clinic concerning issues she has been having with a left medial/plantar foot ulcer. This is actually been an issue for her since October 2020. She has been seeing Dr. Doran Durand for quite some time during that course. Fortunately there is no signs of active infection  at this time. Or least no mention of this to have seen in general. With that being said unfortunately I do see some signs of erythema noted today that does have me concerned about the possibility of infection at this point in the surrounding area of the wound. There is also a warm to touch at the site which is somewhat concerning. Fortunately there is no evidence of systemic infection which is great news. The patient does have a history of diabetes mellitus type 2, Charcot foot which is what led to the wound, and hypertension. She notes that she was in a cast for some time with Dr. Doran Durand for about 8 weeks. During that time they were utilizing according to the patient silver  nitrate along with a foam doughnut and then Coban to secure in place in the cast in place. With that being said I do not have the actual records to review we are going to try to get a hold of those unfortunately they would not flow over into care everywhere I did look today. She has been seeing Dr. Doran Durand and his physician assistant Larkin Ina up until the end of May and apparently is still seeing them on a regular basis every 2 weeks roughly. She has also tried Iodosorb without effect here. 01/24/2020 upon evaluation today patient actually appears to be doing quite well with regard to her wounds. She has been tolerating the dressing changes without complication. Fortunately there is no signs of active infection spreading which is good news. Her culture did show signs of Staph aureus I did place her on Augmentin due to the erythema surrounding the wound. With that being said the wound does appear to be doing better she has her longer walking cast/boot and I think that is actually good for her for the time being. I am considering reinitiating total contact cast when she gets back from vacation but next week she will actually be out of town at [employer] she knows not to get in the water but she still obviously is planning to enjoy herself  she is going to take it easy on her foot however. 02/07/2020 upon evaluation today patient appears to be doing fairly well in regard to her ulcer on her foot. Fortunately there is no signs of severe infection at this time which is great news and overall very pleased in that regard. With that being said I do think that she could still benefit from a total contact cast. Nonetheless she is using her walking boot which at least provide some protection and that it prevents some of the friction occurring when she is ambulating. 02/14/2020 upon evaluation today patient appears to be doing well with regard to her foot ulcer. This is actually measuring a little bit smaller yet again this week. Overall very pleased with where things stand and I do not see any signs of active infection at this time which is also good news. Since she is measuring better the patient has wanting to somewhat hold off on proceeding with the total contact cast which I think is reasonable at this point. 02/28/2020 on evaluation today patient appears to be doing well in general in regard to her wound although she has a lot of callus buildup as compared to last time I saw her. This is can require sharp debridement today. I do believe she really needs the total contact cast as well which we have discussed previous. 7/23; patient comes in for a total contact cast change 03/06/2020 on evaluation today patient appears to be doing quite well with regard to her wounds. Fortunately the wound bed is measuring smaller and looking much better there is little callus noted although there is some debridement necessary today. 03/13/2020 on evaluation today patient's wound actually appears to be doing excellent which is great news. With that being said unfortunately she is having some issues currently with her left leg where she does have cellulitis it appears. This may have come from an area that rubbed underneath the cast from last week that we noted we  padded that area and it looks to be doing excellent at this point but nonetheless the leg was somewhat painful, swollen, and somewhat erythematous. She also had an elevated white blood  cell count of 11.5 based on what I saw on looking at her records from the med center in [facility] from where she was seen yesterday. Unfortunately with [place] having a provider on vacation there was no one here in the clinic in the afternoon when she called therefore she went to the ER as advised. Subsequently they did not cut off the cast as they did not have anyone from orthopedics there to do so and subsequently also did not have the ability to do the Doppler for evaluation of DVT They recommended therefore given her dose of Eliquis as well as . Augmentin and sent her home to come see [place] today to have the cast taken off and then she is supposed to go back to have the study for DVT performed they are following. 03/20/2020 upon evaluation today patient appears to be doing well with regard to her foot all things considered we have not been able to use the total contact cast due to the infection that she had last week. She has been on the doxycycline and she had a 10-day supply of that I do believe that is helping and her leg appears to be doing better. With that being said there is fortunately no signs of active infection systemically at this time which is good news. No fevers, chills, nausea, vomiting, or diarrhea. 03/27/2020 upon evaluation today patient appears to be doing well with regard to her foot ulcer. There does not appear to be signs of active infection which is great news. Overall I am very pleased with where things stand at this point. 04/03/2020 upon evaluation today patient appears to be doing pretty well in regard to the overall appearance of her wound. Fortunately there is no signs of active infection at this time which is great news. No fevers, chills, nausea, vomiting, or diarrhea. With that being said she  does have some blue-green drainage that actually is a little bit concerning to me for the possibility of Pseudomonas. I discussed that with the patient today. With that being said I do believe that we may be able to manage this however with the topical antibiotic cream as opposed to having to do anything oral especially since she seems to be doing so well with overall appearance of the wound. 04/10/2020 on evaluation today patient appears to be doing about the same roughly in regard to the overall size of her wound. With that being said she fortunately has not shown any signs of worsening overall which is good news. I do believe that she is doing a great job trying to offload but again she may still do better with the cast. I do not see in the blue-green drainage that we noticed previously I do believe the gentamicin help in this regard. 04/17/2020 on evaluation today patient's wound appears to be doing about the same at this point. There is no significant improvement at this point. No fever chills noted. She is up for put the cast back on the day. That she states in a couple weeks she will need to have this off to go to a workshop. 04/24/2020 on evaluation today patient appears to be doing significantly better in regard to her wound. Fortunately there is no signs of active infection and overall feel like she is making great progress the cast seems to have done excellent for her. 05/01/2020 upon evaluation today patient presents for reevaluation she really does not appear to be doing too badly in regard to the actual wound  on the left foot we have been managing. Unfortunately she has bilateral lower extremity edema with blisters between the webspace of her first and second toe on both feet. She has a tremendous amount of edema in the legs which I think is where this is coming from it does not appear to be infected but nonetheless I do believe this is can be something that needs to be addressed today.  Obviously this means we probably will not be putting the cast on at this point. She attributes this to the fact that she was sitting with her feet on the floor much longer during a conference last week she had a great time but unfortunately had a lot of complications as a result. 05/08/2020 upon evaluation today patient appears to be doing somewhat better in regard to her wounds at this time. Fortunately there is no signs of active infection which is great news. With that being said I do believe that the blisters have ruptured and unfortunately did not just reattach I will remove some of the blistered tissue today. With that being said I do think the wound itself on the plantar aspect of left foot does need to have sharp debridement. 05/15/2020 upon evaluation today patient appears to be doing about the same in regard to her foot ulcer. Unfortunately in the past week her husband had a fall where he sustained a mild traumatic brain bleed. Fortunately he is doing better but being that he was in the hospital she had a walk on this a lot more. The wound does not appear to be any better is also not really appearing to be significantly worse which is good news. There is no signs of active infection at this time. 10/14; patient with a small diabetic wound on the medial part of her left foot. We have been using silver collagen a total contact cast making good progress. I think the patient had a series of blisters on her dorsal foot probably secondary to having her legs recumbent for 3 days while in a conference in [place]. We wrapped her leg last week these are all healed. We did not previously have her in compression on the right leg. 05/29/2020 upon evaluation today patient appears to be doing well with regard to the wound on the plantar aspect of her foot medially. This is measuring smaller and looking much better than last time I saw her. Again when I did see her last was 2 weeks back and the wound was  significantly larger. I do believe the cast is helping and I believe the collagen is a good option for her. 06/05/2020 on evaluation today patient appears to be doing well with regard to her foot ulcer this is actually measuring significantly better and overall I feel like she is doing excellent. There is no signs of active infection at this time. 06/12/2020 upon evaluation today patient actually continues to show signs of good improvement which is excellent news. I am extremely pleased with how she seems to be progressing at this point in regard to her wound. There is still some depth to the wound but I do believe the collagen is helping her quite a bit. 06/19/2020 upon evaluation today patient appears to be doing well with regard to her plantar foot ulcer. She is actually making excellent progress and in fact this appears to be almost completely healed. With that being said I do believe that the patient is going to actually require 1 more week in the cast although  after that I am hopeful she will be ready for discharge. 06/26/2020 on evaluation today patient appears to be doing well in regard to her wound currently. Fortunately there is no signs of active infection in general I feel like she is doing excellent. This appears to be completely healed I think she is ready to come out of the cast. Objective Constitutional Well-nourished and well-hydrated in no acute distress. Vitals Time Taken: 1:15 PM, Height: 66 in, Weight: 245 lbs, BMI: 39.5, Temperature: 99.7 F, Pulse: 98 bpm, Respiratory Rate: 18 breaths/min, Blood Pressure: 140/87 mmHg. Respiratory normal breathing without difficulty. Psychiatric this patient is able to make decisions and demonstrates good insight into disease process. Alert and Oriented x 3. pleasant and cooperative. General Notes: Upon inspection patient's wound bed showed signs of complete epithelization with no open wound and overall she seems to be doing quite well and  extremely pleased with where things stand at this time. Integumentary (Hair, Skin) Wound #1 status is Healed - Epithelialized. Original cause of wound was Gradually Appeared. The wound is located on the [place]. The wound measures 0cm length x 0cm width x 0cm depth; 0cm^2 area and 0cm^3 volume. There is Fat Layer (Subcutaneous Tissue) exposed. There is no tunneling or undermining noted. There is a none present amount of drainage noted. The wound margin is well defined and not attached to the wound base. There is no granulation within the wound bed. There is no necrotic tissue within the wound bed. Assessment Active Problems ICD-10 Type 2 diabetes mellitus with foot ulcer Non-pressure chronic ulcer of other part of left foot with fat layer exposed Charcot's joint, left ankle and foot Type 2 diabetes mellitus with diabetic neuropathy, unspecified Essential (primary) hypertension Plan Discharge From [facility] Services: Discharge from [facility] Frequency: Change dressing every day. - foam callous pad to healed area daily Skin Barriers/Peri-Wound Care: Moisturizing lotion - to both feet and legs daily Wound Cleansing: May shower and wash wound with soap and water. Edema Control: Avoid standing for long periods of time Elevate legs to the level of the heart or above for 30 minutes daily and/or when sitting, a frequency of: Exercise regularly Support Garment 20-30 mm/Hg pressure to: - juxtalite compression garment to both legs daily 1. I would recommend currently that we going to continue with the wound care measures as before and the patient is in agreement with the plan. She has been tolerating the dressing changes in the cast without complication fortunately she is completely healed and I am extremely pleased. I do believe that she needs to be careful with her foot over the next several weeks but at the same time I think a corn/callus pad will be  sufficient. 2. I did recommend that she limit her walking initially until she gets into a better routine with her regular shoe and then subsequently we will see where things stand in the future if she has any concerns or questions. We will see the patient back for follow-up visit as needed. Electronic Signature(s) Signed: 06/26/2020 1:49:15 PM By: Worthy Keeler PA-C Entered By: Worthy Keeler on 06/26/2020 13:49:15 -------------------------------------------------------------------------------- SuperBill Details Patient Name: Date of Service: [name] [name], [name] RA E. 06/26/2020 Medical Record Number: [id number] Patient Account Number: [id number] Date of Birth/Sex: Treating RN: May 28, 1946 (74 y.o. [name] [name] Primary Care Provider: [name] Other Clinician: Referring Provider: Treating Provider/Extender: Landis Martins Weeks in Treatment: 23 Diagnosis Coding ICD-10 Codes Code Description E11.621 Type 2  diabetes mellitus with foot ulcer L97.522 Non-pressure chronic ulcer of other part of left foot with fat layer exposed M14.672 Charcot's joint, left ankle and foot E11.40 Type 2 diabetes mellitus with diabetic neuropathy, unspecified I10 Essential (primary) hypertension Facility Procedures CPT4 Code: 95072257 Description: 99213 - WOUND CARE VISIT-LEV 3 EST PT Modifier: Quantity: 1 Physician Procedures : CPT4 Code Description Modifier [id number] 99213 - WC PHYS LEVEL 3 - EST PT ICD-10 Diagnosis Description E11.621 Type 2 diabetes mellitus with foot ulcer L97.522 Non-pressure chronic ulcer of other part of left foot with fat layer exposed M14.672 Charcot's  joint, left ankle and foot E11.40 Type 2 diabetes mellitus with diabetic neuropathy, unspecified Quantity: 1 Electronic Signature(s) Signed: 06/26/2020 1:49:28 PM By: Worthy Keeler PA-C Entered By: Worthy Keeler on 06/26/2020 13:49:27

## 2020-06-27 DIAGNOSIS — L4059 Other psoriatic arthropathy: Secondary | ICD-10-CM | POA: Diagnosis not present

## 2020-06-27 NOTE — Progress Notes (Signed)
[name] [name] ([id number]) Visit Report for 06/26/2020 Arrival Information Details Patient Name: Date of Service: [name] [name], [name] E. 06/26/2020 12:30 PM Medical Record Number: [id number] Patient Account Number: [id number] Date of Birth/Sex: Treating RN: Dec 11, 1945 (74 y.o. [name] [name] Primary Care Evaleen Sant: [name] Other Clinician: Referring Esai Stecklein: Treating Conception Doebler/Extender: Adele Schilder in Treatment: 23 Visit Information History Since Last Visit Added or deleted any medications: No Patient Arrived: Ambulatory Any new allergies or adverse reactions: No Arrival Time: 13:15 Had a fall or experienced change in No Accompanied By: alone activities of daily living that may affect Transfer Assistance: None risk of falls: Patient Identification Verified: Yes Signs or symptoms of abuse/neglect since last visito No Secondary Verification Process Completed: Yes Hospitalized since last visit: No Patient Requires Transmission-Based Precautions: No Implantable device outside of the clinic excluding No Patient Has Alerts: No cellular tissue based products placed in the center since last visit: Has Dressing in Place as Prescribed: Yes Has Footwear/Offloading in Place as Prescribed: Yes Left: T Contact Cast otal Pain Present Now: No Electronic Signature(s) Signed: 06/26/2020 5:29:55 PM By: Deon Pilling Entered By: Deon Pilling on 06/26/2020 13:18:01 -------------------------------------------------------------------------------- Clinic Level of Care Assessment Details Patient Name: Date of Service: [name], [name] RA E. 06/26/2020 12:30 PM Medical Record Number: [id number] Patient Account Number: [id number] Date of Birth/Sex: Treating RN: [birth year] (74 y.o. [name] [name] Primary Care Summers Buendia: [name] Other Clinician: Referring Zadkiel Dragan: Treating Karri Kallenbach/Extender: Adele Schilder in Treatment: 23 Clinic Level of Care  Assessment Items TOOL 4 Quantity Score []  - 0 Use when only an EandM is performed on FOLLOW-UP visit ASSESSMENTS - Nursing Assessment / Reassessment X- 1 10 Reassessment of Co-morbidities (includes updates in patient status) X- 1 5 Reassessment of Adherence to Treatment Plan ASSESSMENTS - Wound and Skin A ssessment / Reassessment X - Simple Wound Assessment / Reassessment - one wound 1 5 []  - 0 Complex Wound Assessment / Reassessment - multiple wounds []  - 0 Dermatologic / Skin Assessment (not related to wound area) ASSESSMENTS - Focused Assessment []  - 0 Circumferential Edema Measurements - multi extremities []  - 0 Nutritional Assessment / Counseling / Intervention X- 1 5 Lower Extremity Assessment (monofilament, tuning fork, pulses) []  - 0 Peripheral Arterial Disease Assessment (using hand held doppler) ASSESSMENTS - Ostomy and/or Continence Assessment and Care []  - 0 Incontinence Assessment and Management []  - 0 Ostomy Care Assessment and Management (repouching, etc.) PROCESS - Coordination of Care X - Simple Patient / Family Education for ongoing care 1 15 []  - 0 Complex (extensive) Patient / Family Education for ongoing care X- 1 10 Staff obtains Programmer, systems, Records, T Results / Process Orders est []  - 0 Staff telephones HHA, Nursing Homes / Clarify orders / etc []  - 0 Routine Transfer to another Facility (non-emergent condition) []  - 0 Routine Hospital Admission (non-emergent condition) []  - 0 New Admissions / Biomedical engineer / Ordering NPWT Apligraf, etc. , []  - 0 Emergency Hospital Admission (emergent condition) X- 1 10 Simple Discharge Coordination []  - 0 Complex (extensive) Discharge Coordination PROCESS - Special Needs []  - 0 Pediatric / Minor Patient Management []  - 0 Isolation Patient Management []  - 0 Hearing / Language / Visual special needs []  - 0 Assessment of Community assistance (transportation, D/C planning, etc.) []  -  0 Additional assistance / Altered mentation []  - 0 Support Surface(s) Assessment (bed, cushion, seat, etc.) INTERVENTIONS - Wound Cleansing / Measurement X - Simple Wound Cleansing -  one wound 1 5 []  - 0 Complex Wound Cleansing - multiple wounds X- 1 5 Wound Imaging (photographs - any number of wounds) []  - 0 Wound Tracing (instead of photographs) X- 1 5 Simple Wound Measurement - one wound []  - 0 Complex Wound Measurement - multiple wounds INTERVENTIONS - Wound Dressings X - Small Wound Dressing one or multiple wounds 1 10 []  - 0 Medium Wound Dressing one or multiple wounds []  - 0 Large Wound Dressing one or multiple wounds []  - 0 Application of Medications - topical []  - 0 Application of Medications - injection INTERVENTIONS - Miscellaneous []  - 0 External ear exam []  - 0 Specimen Collection (cultures, biopsies, blood, body fluids, etc.) []  - 0 Specimen(s) / Culture(s) sent or taken to Lab for analysis []  - 0 Patient Transfer (multiple staff / Civil Service fast streamer / Similar devices) []  - 0 Simple Staple / Suture removal (25 or less) []  - 0 Complex Staple / Suture removal (26 or more) []  - 0 Hypo / Hyperglycemic Management (close monitor of Blood Glucose) []  - 0 Ankle / Brachial Index (ABI) - do not check if billed separately X- 1 5 Vital Signs Has the patient been seen at the hospital within the last three years: Yes Total Score: 90 Level Of Care: New/Established - Level 3 Electronic Signature(s) Signed: 06/27/2020 2:58:18 PM By: [name] RN, BSN Entered By: [name] on 06/26/2020 13:36:52 -------------------------------------------------------------------------------- Encounter Discharge Information Details Patient Name: Date of Service: [name] [name] 06/26/2020 12:30 PM Medical Record Number: [id number] Patient Account Number: [id number] Date of Birth/Sex: Treating RN: 1945/12/14 (74 y.o. [name] [name] Primary Care Joesphine Schemm: [name] Other  Clinician: Referring Julus Kelley: Treating Haisley Arens/Extender: Adele Schilder in Treatment: 23 Encounter Discharge Information Items Discharge Condition: Stable Ambulatory Status: Wheelchair Discharge Destination: Home Transportation: Private Auto Accompanied By: self Schedule Follow-up Appointment: Yes Clinical Summary of Care: Patient Declined Electronic Signature(s) Signed: 06/26/2020 5:02:10 PM By: Carlene Coria RN Entered By: Carlene Coria on 06/26/2020 17:00:52 -------------------------------------------------------------------------------- Lower Extremity Assessment Details Patient Name: Date of Service: KRISTE, BROMAN RA E. 06/26/2020 12:30 PM Medical Record Number: [id number] Patient Account Number: [id number] Date of Birth/Sex: Treating RN: 05-27-1946 (74 y.o. [name] [name] Primary Care Shir Bergman: [name] Other Clinician: Referring Kara Melching: Treating Evin Chirco/Extender: Landis Martins Weeks in Treatment: 23 Edema Assessment Assessed: [Left: Yes] [Right: No] Edema: [Left: N] [Right: o] Calf Left: Right: Point of Measurement: 35 cm From Medial Instep 34 cm Ankle Left: Right: Point of Measurement: 9 cm From Medial Instep 23 cm Vascular Assessment Pulses: Dorsalis Pedis Palpable: [Left:Yes] Electronic Signature(s) Signed: 06/26/2020 5:29:55 PM By: Deon Pilling Entered By: Deon Pilling on 06/26/2020 13:18:48 -------------------------------------------------------------------------------- [place] Details Patient Name: Date of Service: [name] [name] 06/26/2020 12:30 PM Medical Record Number: [id number] Patient Account Number: [id number] Date of Birth/Sex: Treating RN: September 20, 1945 (74 y.o. [name] [name] Primary Care Kue Fox: [name] Other Clinician: Referring Wylder Macomber: Treating Christell Steinmiller/Extender: Adele Schilder in Treatment: 23 Active Inactive Electronic  Signature(s) Signed: 06/27/2020 2:58:18 PM By: [name] RN, BSN Entered By: [name] on 06/26/2020 13:36:16 -------------------------------------------------------------------------------- Pain Assessment Details Patient Name: Date of Service: [name] [name] 06/26/2020 12:30 PM Medical Record Number: [id number] Patient Account Number: [id number] Date of Birth/Sex: Treating RN: 12/15/1945 (74 y.o. [name] [name] Primary Care Rashee Marschall: [name] Other Clinician: Referring [place]: Treating Sala Tague/Extender: Adele Schilder in Treatment: 41 Active  Problems Location of Pain Severity and Description of Pain Patient Has Paino No Site Locations Rate the pain. Current Pain Level: 0 Pain Management and Medication Current Pain Management: Medication: No Cold Application: No Rest: No Massage: No Activity: No T.[name]: No Heat Application: No Leg drop or elevation: No Is the Current Pain Management Adequate: Adequate How does your wound impact your activities of daily livingo Sleep: No Bathing: No Appetite: No Relationship With Others: No Bladder Continence: No Emotions: No Bowel Continence: No Work: No Toileting: No Drive: No Dressing: No Hobbies: No Electronic Signature(s) Signed: 06/26/2020 5:29:55 PM By: Deon Pilling Entered By: Deon Pilling on 06/26/2020 13:18:38 -------------------------------------------------------------------------------- Patient/Caregiver Education Details Patient Name: Date of Service: [name] [name] 11/17/2021andnbsp12:30 PM Medical Record Number: [id number] Patient Account Number: [id number] Date of Birth/Gender: Treating RN: Jan 10, 1946 (74 y.o. [name] [name] Primary Care Physician: [name] Other Clinician: Referring Physician: Treating Physician/Extender: Adele Schilder in Treatment: 23 Education Assessment Education Provided To: Patient Education Topics  Provided Offloading: Methods: Explain/Verbal Responses: Reinforcements needed, State content correctly Wound/Skin Impairment: Methods: Explain/Verbal Responses: Reinforcements needed, State content correctly Electronic Signature(s) Signed: 06/27/2020 2:58:18 PM By: [name] RN, BSN Entered By: [name] on 06/26/2020 13:28:53 -------------------------------------------------------------------------------- Wound Assessment Details Patient Name: Date of Service: [name] [name] 06/26/2020 12:30 PM Medical Record Number: [id number] Patient Account Number: [id number] Date of Birth/Sex: Treating RN: 12-22-1945 (74 y.o. [name] [name] Primary Care Korayma Hagwood: [name] Other Clinician: Referring Danasia Baker: Treating Marcia Lepera/Extender: Landis Martins Weeks in Treatment: 23 Wound Status Wound Number: 1 Primary Diabetic Wound/Ulcer of the Lower Extremity Etiology: Wound Location: Left, Medial, Plantar Foot Wound Healed - Epithelialized Wounding Event: Gradually Appeared Status: Date Acquired: 05/11/2019 Comorbid Sleep Apnea, Deep Vein Thrombosis, Hypertension, Colitis, Type Weeks Of Treatment: 23 History: II Diabetes, Gout, Osteoarthritis, Neuropathy Clustered Wound: No Photos Photo Uploaded By: Mikeal Hawthorne on 06/27/2020 13:54:37 Wound Measurements Length: (cm) Width: (cm) Depth: (cm) Area: (cm) Volume: (cm) 0 % Reduction in Area: 100% 0 % Reduction in Volume: 100% 0 Epithelialization: Large (67-100%) 0 Tunneling: No 0 Undermining: No Wound Description Classification: Grade 2 Wound Margin: Well defined, not attached Exudate Amount: None Present Foul Odor After Cleansing: No Slough/Fibrino No Wound Bed Granulation Amount: None Present (0%) Exposed Structure Necrotic Amount: None Present (0%) Fascia Exposed: No Fat Layer (Subcutaneous Tissue) Exposed: Yes Tendon Exposed: No Muscle Exposed: No Joint Exposed: No Bone Exposed:  No Electronic Signature(s) Signed: 06/27/2020 2:58:18 PM By: [name] RN, BSN Entered By: [name] on 06/26/2020 13:37:54 -------------------------------------------------------------------------------- [place] Details Patient Name: Date of Service: [name] 06/26/2020 12:30 PM Medical Record Number: [id number] Patient Account Number: [id number] Date of Birth/Sex: Treating RN: 05-09-1946 (74 y.o. [name] [name] Primary Care Keeana Pieratt: [name] Other Clinician: Referring Lavonda Thal: Treating Ja Pistole/Extender: Adele Schilder in Treatment: 23 Vital Signs Time Taken: 13:15 Temperature (F): 99.7 Height (in): 66 Pulse (bpm): 98 Weight (lbs): 245 Respiratory Rate (breaths/min): 18 Body Mass Index (BMI): 39.5 Blood Pressure (mmHg): 140/87 Reference Range: 80 - 120 mg / dl Electronic Signature(s) Signed: 06/26/2020 5:29:55 PM By: Deon Pilling Entered By: Deon Pilling on 06/26/2020 13:18:29

## 2020-06-28 DIAGNOSIS — Z1231 Encounter for screening mammogram for malignant neoplasm of breast: Secondary | ICD-10-CM | POA: Diagnosis not present

## 2020-07-03 ENCOUNTER — Encounter (HOSPITAL_BASED_OUTPATIENT_CLINIC_OR_DEPARTMENT_OTHER): Payer: Medicare Other | Admitting: Physician Assistant

## 2020-07-08 ENCOUNTER — Other Ambulatory Visit: Payer: Self-pay

## 2020-07-08 ENCOUNTER — Encounter (HOSPITAL_BASED_OUTPATIENT_CLINIC_OR_DEPARTMENT_OTHER): Payer: Medicare Other | Admitting: Internal Medicine

## 2020-07-08 DIAGNOSIS — E11621 Type 2 diabetes mellitus with foot ulcer: Secondary | ICD-10-CM | POA: Diagnosis not present

## 2020-07-08 DIAGNOSIS — I10 Essential (primary) hypertension: Secondary | ICD-10-CM | POA: Diagnosis not present

## 2020-07-08 DIAGNOSIS — L97522 Non-pressure chronic ulcer of other part of left foot with fat layer exposed: Secondary | ICD-10-CM | POA: Diagnosis not present

## 2020-07-08 DIAGNOSIS — E1140 Type 2 diabetes mellitus with diabetic neuropathy, unspecified: Secondary | ICD-10-CM | POA: Diagnosis not present

## 2020-07-09 DIAGNOSIS — Z20822 Contact with and (suspected) exposure to covid-19: Secondary | ICD-10-CM | POA: Diagnosis not present

## 2020-07-09 NOTE — Progress Notes (Signed)
[name] [name] ([id number]) Visit Report for 07/08/2020 Debridement Details Patient Name: Date of Service: [name] [name], [name] E. 07/08/2020 9:00 A M Medical Record Number: [id number] Patient Account Number: [id number] Date of Birth/Sex: Treating RN: October 27, 1945 (74 y.o. [name] Primary Care Provider: [name] Other Clinician: Referring Provider: Treating Provider/Extender: Genella Rife in Treatment: 24 Debridement Performed for Assessment: Wound #1R Left,Medial,Plantar Foot Performed By: Physician Ricard Dillon., MD Debridement Type: Debridement Severity of Tissue Pre Debridement: Fat layer exposed Level of Consciousness (Pre-procedure): Awake and Alert Pre-procedure Verification/Time Out Yes - 09:39 Taken: Start Time: 09:39 Pain Control: Lidocaine 5% topical ointment T Area Debrided (L x W): otal 0.9 (cm) x 1.2 (cm) = 1.08 (cm) Tissue and other material debrided: Viable, Non-Viable, Callus, Slough, Subcutaneous, Slough Level: Skin/Subcutaneous Tissue Debridement Description: Excisional Instrument: Curette Bleeding: Minimum Hemostasis Achieved: Pressure End Time: 09:40 Procedural Pain: 0 Post Procedural Pain: 0 Response to Treatment: Procedure was tolerated well Level of Consciousness (Post- Awake and Alert procedure): Post Debridement Measurements of Total Wound Length: (cm) 0.9 Width: (cm) 1.2 Depth: (cm) 0.1 Volume: (cm) 0.085 Character of Wound/Ulcer Post Debridement: Improved Severity of Tissue Post Debridement: Fat layer exposed Post Procedure Diagnosis Same as Pre-procedure Electronic Signature(s) Signed: 07/08/2020 4:47:57 PM By: Linton Ham MD Signed: 07/09/2020 6:12:47 PM By: Levan Hurst RN, BSN Entered By: Linton Ham on 07/08/2020 09:57:55 -------------------------------------------------------------------------------- HPI Details Patient Name: Date of Service: [name] [name] RBA RA E. 07/08/2020 9:00 A M Medical  Record Number: [id number] Patient Account Number: [id number] Date of Birth/Sex: Treating RN: 1946-03-06 (74 y.o. [name] Primary Care Provider: [name] Other Clinician: Referring Provider: Treating Provider/Extender: Genella Rife in Treatment: 24 History of Present Illness HPI Description: 01/17/2020 upon evaluation today patient presents for initial evaluation here in our clinic concerning issues she has been having with a left medial/plantar foot ulcer. This is actually been an issue for her since October 2020. She has been seeing Dr. Doran Durand for quite some time during that course. Fortunately there is no signs of active infection at this time. Or least no mention of this to have seen in general. With that being said unfortunately I do see some signs of erythema noted today that does have me concerned about the possibility of infection at this point in the surrounding area of the wound. There is also a warm to touch at the site which is somewhat concerning. Fortunately there is no evidence of systemic infection which is great news. The patient does have a history of diabetes mellitus type 2, Charcot foot which is what led to the wound, and hypertension. She notes that she was in a cast for some time with Dr. Doran Durand for about 8 weeks. During that time they were utilizing according to the patient silver nitrate along with a foam doughnut and then Coban to secure in place in the cast in place. With that being said I do not have the actual records to review we are going to try to get a hold of those unfortunately they would not flow over into care everywhere I did look today. She has been seeing Dr. Doran Durand and his physician assistant Larkin Ina up until the end of May and apparently is still seeing them on a regular basis every 2 weeks roughly. She has also tried Iodosorb without effect here. 01/24/2020 upon evaluation today patient actually appears to be doing quite well  with regard to her wounds. She has been tolerating the  dressing changes without complication. Fortunately there is no signs of active infection spreading which is good news. Her culture did show signs of Staph aureus I did place her on Augmentin due to the erythema surrounding the wound. With that being said the wound does appear to be doing better she has her longer walking cast/boot and I think that is actually good for her for the time being. I am considering reinitiating total contact cast when she gets back from vacation but next week she will actually be out of town at [employer] she knows not to get in the water but she still obviously is planning to enjoy herself she is going to take it easy on her foot however. 02/07/2020 upon evaluation today patient appears to be doing fairly well in regard to her ulcer on her foot. Fortunately there is no signs of severe infection at this time which is great news and overall very pleased in that regard. With that being said I do think that she could still benefit from a total contact cast. Nonetheless she is using her walking boot which at least provide some protection and that it prevents some of the friction occurring when she is ambulating. 02/14/2020 upon evaluation today patient appears to be doing well with regard to her foot ulcer. This is actually measuring a little bit smaller yet again this week. Overall very pleased with where things stand and I do not see any signs of active infection at this time which is also good news. Since she is measuring better the patient has wanting to somewhat hold off on proceeding with the total contact cast which I think is reasonable at this point. 02/28/2020 on evaluation today patient appears to be doing well in general in regard to her wound although she has a lot of callus buildup as compared to last time I saw her. This is can require sharp debridement today. I do believe she really needs the total contact cast as  well which we have discussed previous. 7/23; patient comes in for a total contact cast change 03/06/2020 on evaluation today patient appears to be doing quite well with regard to her wounds. Fortunately the wound bed is measuring smaller and looking much better there is little callus noted although there is some debridement necessary today. 03/13/2020 on evaluation today patient's wound actually appears to be doing excellent which is great news. With that being said unfortunately she is having some issues currently with her left leg where she does have cellulitis it appears. This may have come from an area that rubbed underneath the cast from last week that we noted we padded that area and it looks to be doing excellent at this point but nonetheless the leg was somewhat painful, swollen, and somewhat erythematous. She also had an elevated white blood cell count of 11.5 based on what I saw on looking at her records from the med center in [facility] from where she was seen yesterday. Unfortunately with [place] having a provider on vacation there was no one here in the clinic in the afternoon when she called therefore she went to the ER as advised. Subsequently they did not cut off the cast as they did not have anyone from orthopedics there to do so and subsequently also did not have the ability to do the Doppler for evaluation of DVT They recommended therefore given her dose of Eliquis as well as . Augmentin and sent her home to come see [place] today to have  the cast taken off and then she is supposed to go back to have the study for DVT performed they are following. 03/20/2020 upon evaluation today patient appears to be doing well with regard to her foot all things considered we have not been able to use the total contact cast due to the infection that she had last week. She has been on the doxycycline and she had a 10-day supply of that I do believe that is helping and her leg appears to be doing better. With  that being said there is fortunately no signs of active infection systemically at this time which is good news. No fevers, chills, nausea, vomiting, or diarrhea. 03/27/2020 upon evaluation today patient appears to be doing well with regard to her foot ulcer. There does not appear to be signs of active infection which is great news. Overall I am very pleased with where things stand at this point. 04/03/2020 upon evaluation today patient appears to be doing pretty well in regard to the overall appearance of her wound. Fortunately there is no signs of active infection at this time which is great news. No fevers, chills, nausea, vomiting, or diarrhea. With that being said she does have some blue-green drainage that actually is a little bit concerning to me for the possibility of Pseudomonas. I discussed that with the patient today. With that being said I do believe that we may be able to manage this however with the topical antibiotic cream as opposed to having to do anything oral especially since she seems to be doing so well with overall appearance of the wound. 04/10/2020 on evaluation today patient appears to be doing about the same roughly in regard to the overall size of her wound. With that being said she fortunately has not shown any signs of worsening overall which is good news. I do believe that she is doing a great job trying to offload but again she may still do better with the cast. I do not see in the blue-green drainage that we noticed previously I do believe the gentamicin help in this regard. 04/17/2020 on evaluation today patient's wound appears to be doing about the same at this point. There is no significant improvement at this point. No fever chills noted. She is up for put the cast back on the day. That she states in a couple weeks she will need to have this off to go to a workshop. 04/24/2020 on evaluation today patient appears to be doing significantly better in regard to her wound.  Fortunately there is no signs of active infection and overall feel like she is making great progress the cast seems to have done excellent for her. 05/01/2020 upon evaluation today patient presents for reevaluation she really does not appear to be doing too badly in regard to the actual wound on the left foot we have been managing. Unfortunately she has bilateral lower extremity edema with blisters between the webspace of her first and second toe on both feet. She has a tremendous amount of edema in the legs which I think is where this is coming from it does not appear to be infected but nonetheless I do believe this is can be something that needs to be addressed today. Obviously this means we probably will not be putting the cast on at this point. She attributes this to the fact that she was sitting with her feet on the floor much longer during a conference last week she had a great time but unfortunately  had a lot of complications as a result. 05/08/2020 upon evaluation today patient appears to be doing somewhat better in regard to her wounds at this time. Fortunately there is no signs of active infection which is great news. With that being said I do believe that the blisters have ruptured and unfortunately did not just reattach I will remove some of the blistered tissue today. With that being said I do think the wound itself on the plantar aspect of left foot does need to have sharp debridement. 05/15/2020 upon evaluation today patient appears to be doing about the same in regard to her foot ulcer. Unfortunately in the past week her husband had a fall where he sustained a mild traumatic brain bleed. Fortunately he is doing better but being that he was in the hospital she had a walk on this a lot more. The wound does not appear to be any better is also not really appearing to be significantly worse which is good news. There is no signs of active infection at this time. 10/14; patient with a small  diabetic wound on the medial part of her left foot. We have been using silver collagen a total contact cast making good progress. I think the patient had a series of blisters on her dorsal foot probably secondary to having her legs recumbent for 3 days while in a conference in [place]. We wrapped her leg last week these are all healed. We did not previously have her in compression on the right leg. 05/29/2020 upon evaluation today patient appears to be doing well with regard to the wound on the plantar aspect of her foot medially. This is measuring smaller and looking much better than last time I saw her. Again when I did see her last was 2 weeks back and the wound was significantly larger. I do believe the cast is helping and I believe the collagen is a good option for her. 06/05/2020 on evaluation today patient appears to be doing well with regard to her foot ulcer this is actually measuring significantly better and overall I feel like she is doing excellent. There is no signs of active infection at this time. 06/12/2020 upon evaluation today patient actually continues to show signs of good improvement which is excellent news. I am extremely pleased with how she seems to be progressing at this point in regard to her wound. There is still some depth to the wound but I do believe the collagen is helping her quite a bit. 06/19/2020 upon evaluation today patient appears to be doing well with regard to her plantar foot ulcer. She is actually making excellent progress and in fact this appears to be almost completely healed. With that being said I do believe that the patient is going to actually require 1 more week in the cast although after that I am hopeful she will be ready for discharge. 06/26/2020 on evaluation today patient appears to be doing well in regard to her wound currently. Fortunately there is no signs of active infection in general I feel like she is doing excellent. This appears to be  completely healed I think she is ready to come out of the cast. 11/29; patient comes back in the clinic today with a very quick reopening in the exact same area on the medial plantar foot. She had been healed out last time. She went back into some new balance shoes that she got at [employer] with a custom insert. As it turns out this wound also happened  when wearing these shoes although there was some modification made I think with the wound initially happened they added some foam around the wound area. This obviously is not going to be sufficient. Electronic Signature(s) Signed: 07/08/2020 4:47:57 PM By: Linton Ham MD Entered By: Linton Ham on 07/08/2020 09:59:09 -------------------------------------------------------------------------------- Physical Exam Details Patient Name: Date of Service: [name] Amato RA E. 07/08/2020 9:00 A M Medical Record Number: [id number] Patient Account Number: [id number] Date of Birth/Sex: Treating RN: Apr 06, 1946 (74 y.o. [name] Primary Care Provider: [name] Other Clinician: Referring Provider: Treating Provider/Extender: Genella Rife in Treatment: 24 Constitutional Sitting or standing Blood Pressure is within target range for patient.. Pulse regular and within target range for patient.Marland Kitchen Respirations regular, non-labored and within target range.. Temperature is normal and within the target range for the patient.Marland Kitchen Appears in no distress. Cardiovascular Pedal pulses are palpable on the left. Notes Wound exam; the area is in the exact same spot on the medial mid plantar foot. Charcot deformity. Callus around the wound and debris over the surface. I used a #5 curette to remove the callus down to healthy circumferential skin and debris from the wound surface. Hemostasis with direct pressure. There is no evidence of infection no exposed bone Electronic Signature(s) Signed: 07/08/2020 4:47:57 PM By: Linton Ham  MD Entered By: Linton Ham on 07/08/2020 10:00:24 -------------------------------------------------------------------------------- Physician Orders Details Patient Name: Date of Service: [name] [name] RBA RA E. 07/08/2020 9:00 A M Medical Record Number: [id number] Patient Account Number: [id number] Date of Birth/Sex: Treating RN: 04-21-1946 (74 y.o. [name] Primary Care Provider: [name] Other Clinician: Referring Provider: Treating Provider/Extender: Genella Rife in Treatment: 24 Verbal / Phone Orders: No Diagnosis Coding ICD-10 Coding Code Description E11.621 Type 2 diabetes mellitus with foot ulcer L97.522 Non-pressure chronic ulcer of other part of left foot with fat layer exposed M14.672 Charcot's joint, left ankle and foot E11.40 Type 2 diabetes mellitus with diabetic neuropathy, unspecified I10 Essential (primary) hypertension Follow-up Appointments Return Appointment in 1 week. Dressing Change Frequency Wound #1R Left,Medial,Plantar Foot Do not change entire dressing for one week. Skin Barriers/Peri-Wound Care Moisturizing lotion - to right foot and leg daily Wound Cleansing Wound #1R Left,Medial,Plantar Foot May shower with protection. - use cast protector Primary Wound Dressing Wound #1R Left,Medial,Plantar Foot Hydrofera Blue Secondary Dressing Wound #1R Left,Medial,Plantar Foot Foam - donut Dry Gauze Edema Control Avoid standing for long periods of time Elevate legs to the level of the heart or above for 30 minutes daily and/or when sitting, a frequency of: - throughout the day Exercise regularly Support Garment 20-30 mm/Hg pressure to: - juxtalite compression garment to right leg daily Off-Loading Total Contact Cast to Left Lower Extremity Electronic Signature(s) Signed: 07/08/2020 4:47:57 PM By: Linton Ham MD Signed: 07/09/2020 6:12:47 PM By: Levan Hurst RN, BSN Entered By: Levan Hurst on 07/08/2020  09:45:03 -------------------------------------------------------------------------------- Problem List Details Patient Name: Date of Service: [name] [name] RBA RA E. 07/08/2020 9:00 A M Medical Record Number: [id number] Patient Account Number: [id number] Date of Birth/Sex: Treating RN: Mar 07, 1946 (74 y.o. [name] Primary Care Provider: [name] Other Clinician: Referring Provider: Treating Provider/Extender: Genella Rife in Treatment: 24 Active Problems ICD-10 Encounter Code Description Active Date MDM Diagnosis E11.621 Type 2 diabetes mellitus with foot ulcer 01/17/2020 No Yes L97.522 Non-pressure chronic ulcer of other part of left foot with fat layer exposed 01/17/2020 No Yes M14.672 Charcot's joint, left ankle and foot 01/17/2020 No Yes  E11.40 Type 2 diabetes mellitus with diabetic neuropathy, unspecified 01/17/2020 No Yes Inactive Problems ICD-10 Code Description Active Date Inactive Date I10 Essential (primary) hypertension 01/17/2020 01/17/2020 Resolved Problems Electronic Signature(s) Signed: 07/08/2020 4:47:57 PM By: Linton Ham MD Entered By: Linton Ham on 07/08/2020 09:53:43 -------------------------------------------------------------------------------- Progress Note Details Patient Name: Date of Service: [name] Amato RA E. 07/08/2020 9:00 A M Medical Record Number: [id number] Patient Account Number: [id number] Date of Birth/Sex: Treating RN: 1946-05-19 (74 y.o. [name] Primary Care Provider: [name] Other Clinician: Referring Provider: Treating Provider/Extender: Genella Rife in Treatment: 24 Subjective History of Present Illness (HPI) 01/17/2020 upon evaluation today patient presents for initial evaluation here in our clinic concerning issues she has been having with a left medial/plantar foot ulcer. This is actually been an issue for her since October 2020. She has been seeing Dr. Doran Durand for  quite some time during that course. Fortunately there is no signs of active infection at this time. Or least no mention of this to have seen in general. With that being said unfortunately I do see some signs of erythema noted today that does have me concerned about the possibility of infection at this point in the surrounding area of the wound. There is also a warm to touch at the site which is somewhat concerning. Fortunately there is no evidence of systemic infection which is great news. The patient does have a history of diabetes mellitus type 2, Charcot foot which is what led to the wound, and hypertension. She notes that she was in a cast for some time with Dr. Doran Durand for about 8 weeks. During that time they were utilizing according to the patient silver nitrate along with a foam doughnut and then Coban to secure in place in the cast in place. With that being said I do not have the actual records to review we are going to try to get a hold of those unfortunately they would not flow over into care everywhere I did look today. She has been seeing Dr. Doran Durand and his physician assistant Larkin Ina up until the end of May and apparently is still seeing them on a regular basis every 2 weeks roughly. She has also tried Iodosorb without effect here. 01/24/2020 upon evaluation today patient actually appears to be doing quite well with regard to her wounds. She has been tolerating the dressing changes without complication. Fortunately there is no signs of active infection spreading which is good news. Her culture did show signs of Staph aureus I did place her on Augmentin due to the erythema surrounding the wound. With that being said the wound does appear to be doing better she has her longer walking cast/boot and I think that is actually good for her for the time being. I am considering reinitiating total contact cast when she gets back from vacation but next week she will actually be out of town at [employer]  she knows not to get in the water but she still obviously is planning to enjoy herself she is going to take it easy on her foot however. 02/07/2020 upon evaluation today patient appears to be doing fairly well in regard to her ulcer on her foot. Fortunately there is no signs of severe infection at this time which is great news and overall very pleased in that regard. With that being said I do think that she could still benefit from a total contact cast. Nonetheless she is using her walking boot which at least provide  some protection and that it prevents some of the friction occurring when she is ambulating. 02/14/2020 upon evaluation today patient appears to be doing well with regard to her foot ulcer. This is actually measuring a little bit smaller yet again this week. Overall very pleased with where things stand and I do not see any signs of active infection at this time which is also good news. Since she is measuring better the patient has wanting to somewhat hold off on proceeding with the total contact cast which I think is reasonable at this point. 02/28/2020 on evaluation today patient appears to be doing well in general in regard to her wound although she has a lot of callus buildup as compared to last time I saw her. This is can require sharp debridement today. I do believe she really needs the total contact cast as well which we have discussed previous. 7/23; patient comes in for a total contact cast change 03/06/2020 on evaluation today patient appears to be doing quite well with regard to her wounds. Fortunately the wound bed is measuring smaller and looking much better there is little callus noted although there is some debridement necessary today. 03/13/2020 on evaluation today patient's wound actually appears to be doing excellent which is great news. With that being said unfortunately she is having some issues currently with her left leg where she does have cellulitis it appears. This may have  come from an area that rubbed underneath the cast from last week that we noted we padded that area and it looks to be doing excellent at this point but nonetheless the leg was somewhat painful, swollen, and somewhat erythematous. She also had an elevated white blood cell count of 11.5 based on what I saw on looking at her records from the med center in [facility] from where she was seen yesterday. Unfortunately with [place] having a provider on vacation there was no one here in the clinic in the afternoon when she called therefore she went to the ER as advised. Subsequently they did not cut off the cast as they did not have anyone from orthopedics there to do so and subsequently also did not have the ability to do the Doppler for evaluation of DVT They recommended therefore given her dose of Eliquis as well as . Augmentin and sent her home to come see [place] today to have the cast taken off and then she is supposed to go back to have the study for DVT performed they are following. 03/20/2020 upon evaluation today patient appears to be doing well with regard to her foot all things considered we have not been able to use the total contact cast due to the infection that she had last week. She has been on the doxycycline and she had a 10-day supply of that I do believe that is helping and her leg appears to be doing better. With that being said there is fortunately no signs of active infection systemically at this time which is good news. No fevers, chills, nausea, vomiting, or diarrhea. 03/27/2020 upon evaluation today patient appears to be doing well with regard to her foot ulcer. There does not appear to be signs of active infection which is great news. Overall I am very pleased with where things stand at this point. 04/03/2020 upon evaluation today patient appears to be doing pretty well in regard to the overall appearance of her wound. Fortunately there is no signs of active infection at this time which is  great  news. No fevers, chills, nausea, vomiting, or diarrhea. With that being said she does have some blue-green drainage that actually is a little bit concerning to me for the possibility of Pseudomonas. I discussed that with the patient today. With that being said I do believe that we may be able to manage this however with the topical antibiotic cream as opposed to having to do anything oral especially since she seems to be doing so well with overall appearance of the wound. 04/10/2020 on evaluation today patient appears to be doing about the same roughly in regard to the overall size of her wound. With that being said she fortunately has not shown any signs of worsening overall which is good news. I do believe that she is doing a great job trying to offload but again she may still do better with the cast. I do not see in the blue-green drainage that we noticed previously I do believe the gentamicin help in this regard. 04/17/2020 on evaluation today patient's wound appears to be doing about the same at this point. There is no significant improvement at this point. No fever chills noted. She is up for put the cast back on the day. That she states in a couple weeks she will need to have this off to go to a workshop. 04/24/2020 on evaluation today patient appears to be doing significantly better in regard to her wound. Fortunately there is no signs of active infection and overall feel like she is making great progress the cast seems to have done excellent for her. 05/01/2020 upon evaluation today patient presents for reevaluation she really does not appear to be doing too badly in regard to the actual wound on the left foot we have been managing. Unfortunately she has bilateral lower extremity edema with blisters between the webspace of her first and second toe on both feet. She has a tremendous amount of edema in the legs which I think is where this is coming from it does not appear to be infected but  nonetheless I do believe this is can be something that needs to be addressed today. Obviously this means we probably will not be putting the cast on at this point. She attributes this to the fact that she was sitting with her feet on the floor much longer during a conference last week she had a great time but unfortunately had a lot of complications as a result. 05/08/2020 upon evaluation today patient appears to be doing somewhat better in regard to her wounds at this time. Fortunately there is no signs of active infection which is great news. With that being said I do believe that the blisters have ruptured and unfortunately did not just reattach I will remove some of the blistered tissue today. With that being said I do think the wound itself on the plantar aspect of left foot does need to have sharp debridement. 05/15/2020 upon evaluation today patient appears to be doing about the same in regard to her foot ulcer. Unfortunately in the past week her husband had a fall where he sustained a mild traumatic brain bleed. Fortunately he is doing better but being that he was in the hospital she had a walk on this a lot more. The wound does not appear to be any better is also not really appearing to be significantly worse which is good news. There is no signs of active infection at this time. 10/14; patient with a small diabetic wound on the medial part of her  left foot. We have been using silver collagen a total contact cast making good progress. I think the patient had a series of blisters on her dorsal foot probably secondary to having her legs recumbent for 3 days while in a conference in [place]. We wrapped her leg last week these are all healed. We did not previously have her in compression on the right leg. 05/29/2020 upon evaluation today patient appears to be doing well with regard to the wound on the plantar aspect of her foot medially. This is measuring smaller and looking much better than last  time I saw her. Again when I did see her last was 2 weeks back and the wound was significantly larger. I do believe the cast is helping and I believe the collagen is a good option for her. 06/05/2020 on evaluation today patient appears to be doing well with regard to her foot ulcer this is actually measuring significantly better and overall I feel like she is doing excellent. There is no signs of active infection at this time. 06/12/2020 upon evaluation today patient actually continues to show signs of good improvement which is excellent news. I am extremely pleased with how she seems to be progressing at this point in regard to her wound. There is still some depth to the wound but I do believe the collagen is helping her quite a bit. 06/19/2020 upon evaluation today patient appears to be doing well with regard to her plantar foot ulcer. She is actually making excellent progress and in fact this appears to be almost completely healed. With that being said I do believe that the patient is going to actually require 1 more week in the cast although after that I am hopeful she will be ready for discharge. 06/26/2020 on evaluation today patient appears to be doing well in regard to her wound currently. Fortunately there is no signs of active infection in general I feel like she is doing excellent. This appears to be completely healed I think she is ready to come out of the cast. 11/29; patient comes back in the clinic today with a very quick reopening in the exact same area on the medial plantar foot. She had been healed out last time. She went back into some new balance shoes that she got at [employer] with a custom insert. As it turns out this wound also happened when wearing these shoes although there was some modification made I think with the wound initially happened they added some foam around the wound area. This obviously is not going to be sufficient. Objective Constitutional Sitting or standing  Blood Pressure is within target range for patient.. Pulse regular and within target range for patient.Marland Kitchen Respirations regular, non-labored and within target range.. Temperature is normal and within the target range for the patient.Marland Kitchen Appears in no distress. Vitals Time Taken: 9:07 AM, Height: 66 in, Weight: 245 lbs, BMI: 39.5, Temperature: 99.0 F, Pulse: 111 bpm, Respiratory Rate: 18 breaths/min, Blood Pressure: 117/62 mmHg. Cardiovascular Pedal pulses are palpable on the left. General Notes: Wound exam; the area is in the exact same spot on the medial mid plantar foot. Charcot deformity. Callus around the wound and debris over the surface. I used a #5 curette to remove the callus down to healthy circumferential skin and debris from the wound surface. Hemostasis with direct pressure. There is no evidence of infection no exposed bone Integumentary (Hair, Skin) Wound #1R status is Open. Original cause of wound was Gradually Appeared. The wound is located  on the [place]. The wound measures 0.9cm length x 1.2cm width x 0.1cm depth; 0.848cm^2 area and 0.085cm^3 volume. There is Fat Layer (Subcutaneous Tissue) exposed. There is no tunneling or undermining noted. There is a medium amount of serous drainage noted. The wound margin is thickened. There is medium (34-66%) pink granulation within the wound bed. There is a medium (34-66%) amount of necrotic tissue within the wound bed including Adherent Slough. Assessment Active Problems ICD-10 Type 2 diabetes mellitus with foot ulcer Non-pressure chronic ulcer of other part of left foot with fat layer exposed Charcot's joint, left ankle and foot Type 2 diabetes mellitus with diabetic neuropathy, unspecified Procedures Wound #1R Pre-procedure diagnosis of Wound #1R is a Diabetic Wound/Ulcer of the Lower Extremity located on the Left,Medial,Plantar Foot .Severity of Tissue Pre Debridement is: Fat layer exposed. There was a Excisional  Skin/Subcutaneous Tissue Debridement with a total area of 1.08 sq cm performed by Ricard Dillon., MD. With the following instrument(s): Curette to remove Viable and Non-Viable tissue/material. Material removed includes Callus, Subcutaneous Tissue, and Slough after achieving pain control using Lidocaine 5% topical ointment. No specimens were taken. A time out was conducted at 09:39, prior to the start of the procedure. A Minimum amount of bleeding was controlled with Pressure. The procedure was tolerated well with a pain level of 0 throughout and a pain level of 0 following the procedure. Post Debridement Measurements: 0.9cm length x 1.2cm width x 0.1cm depth; 0.085cm^3 volume. Character of Wound/Ulcer Post Debridement is improved. Severity of Tissue Post Debridement is: Fat layer exposed. Post procedure Diagnosis Wound #1R: Same as Pre-Procedure Pre-procedure diagnosis of Wound #1R is a Diabetic Wound/Ulcer of the Lower Extremity located on the Left,Medial,Plantar Foot . There was a T Contact otal Cast Procedure by Ricard Dillon., MD. Post procedure Diagnosis Wound #1R: Same as Pre-Procedure Plan Follow-up Appointments: Return Appointment in 1 week. Dressing Change Frequency: Wound #1R Left,Medial,Plantar Foot: Do not change entire dressing for one week. Skin Barriers/Peri-Wound Care: Moisturizing lotion - to right foot and leg daily Wound Cleansing: Wound #1R Left,Medial,Plantar Foot: May shower with protection. - use cast protector Primary Wound Dressing: Wound #1R Left,Medial,Plantar Foot: Hydrofera Blue Secondary Dressing: Wound #1R Left,Medial,Plantar Foot: Foam - donut Dry Gauze Edema Control: Avoid standing for long periods of time Elevate legs to the level of the heart or above for 30 minutes daily and/or when sitting, a frequency of: - throughout the day Exercise regularly Support Garment 20-30 mm/Hg pressure to: - juxtalite compression garment to right leg  daily Off-Loading: T Contact Cast to Left Lower Extremity otal 1. I went back to the [facility] and after discussion with the patient and her nursing staff we elected to put her back in a total contact cast today. She did well with this combination and I expect this to heal 2. The bigger issue is that she is not going to be able to go back into the new balance shoes she got at [employer]. There is going to have to be modifications or additional shoes perhaps diabetic foot wear. She tells me she had idiopathic peripheral neuropathy for 30 years before her diabetic status was diagnosed 3. She is an active patient. Were going to have to either send her to biotech or something to consider custom made diabetic shoes these would have to be ordered by her primary physician or the physician following her diabetes. Electronic Signature(s) Signed: 07/08/2020 4:47:57 PM By: Linton Ham MD Entered By: Linton Ham on 07/08/2020  10:01:50 -------------------------------------------------------------------------------- Total Contact Cast Details Patient Name: Date of Service: [name] [name], [name] E. 07/08/2020 9:00 A M Medical Record Number: [id number] Patient Account Number: [id number] Date of Birth/Sex: Treating RN: [birth year] (74 y.o. [name] Primary Care Provider: [name] Other Clinician: Referring Provider: Treating Provider/Extender: Genella Rife in Treatment: 24 T Contact Cast Applied for Wound Assessment: otal Wound #1R Left,Medial,Plantar Foot Performed By: Physician Ricard Dillon., MD Post Procedure Diagnosis Same as Pre-procedure Electronic Signature(s) Signed: 07/08/2020 4:47:57 PM By: Linton Ham MD Signed: 07/09/2020 6:12:47 PM By: Levan Hurst RN, BSN Entered By: Levan Hurst on 07/08/2020 09:46:00 -------------------------------------------------------------------------------- [place] Details Patient Name: Date of Service: [name] [name],  [name] RA E. 07/08/2020 Medical Record Number: [id number] Patient Account Number: [id number] Date of Birth/Sex: Treating RN: April 15, 1946 (74 y.o. [name] Primary Care Provider: [name] Other Clinician: Referring Provider: Treating Provider/Extender: Genella Rife in Treatment: 24 Diagnosis Coding ICD-10 Codes Code Description E11.621 Type 2 diabetes mellitus with foot ulcer L97.522 Non-pressure chronic ulcer of other part of left foot with fat layer exposed M14.672 Charcot's joint, left ankle and foot E11.40 Type 2 diabetes mellitus with diabetic neuropathy, unspecified Facility Procedures CPT4 Code: [id number] Description: [place] - DEB SUBQ TISSUE 20 SQ CM/< ICD-10 Diagnosis Description L97.522 Non-pressure chronic ulcer of other part of left foot with fat layer exposed Modifier: Quantity: 1 Physician Procedures : CPT4 Code Description Modifier [id number] - WC PHYS SUBQ TISS 20 SQ CM ICD-10 Diagnosis Description L97.522 Non-pressure chronic ulcer of other part of left foot with fat layer exposed Quantity: 1 Electronic Signature(s) Signed: 07/08/2020 4:47:57 PM By: Linton Ham MD Entered By: Linton Ham on 07/08/2020 10:02:04

## 2020-07-09 NOTE — Progress Notes (Signed)
TIARIA, BIBY ([id number]) Visit Report for 07/08/2020 Arrival Information Details Patient Name: Date of Service: JAMELYN, BOVARD RA E. 07/08/2020 9:00 A M Medical Record Number: [id number] Patient Account Number: [id number] Date of Birth/Sex: Treating RN: May 19, 1946 (74 y.o. [name] Primary Care Lovene Maret: [name] Other Clinician: Referring Rayni Nemitz: Treating Dahlila Pfahler/Extender: Genella Rife in Treatment: 24 Visit Information History Since Last Visit Added or deleted any medications: No Patient Arrived: Wheel Chair Any new allergies or adverse reactions: No Arrival Time: 09:07 Had a fall or experienced change in No Accompanied By: self activities of daily living that may affect Transfer Assistance: None risk of falls: Patient Identification Verified: Yes Signs or symptoms of abuse/neglect since last visito No Secondary Verification Process Completed: Yes Hospitalized since last visit: No Patient Requires Transmission-Based Precautions: No Implantable device outside of the clinic excluding No Patient Has Alerts: No cellular tissue based products placed in the center since last visit: Has Dressing in Place as Prescribed: Yes Pain Present Now: No Electronic Signature(s) Signed: 07/08/2020 9:54:43 AM By: Sandre Kitty Entered By: Sandre Kitty on 07/08/2020 09:07:54 -------------------------------------------------------------------------------- Encounter Discharge Information Details Patient Name: Date of Service: [name] 07/08/2020 9:00 A M Medical Record Number: [id number] Patient Account Number: [id number] Date of Birth/Sex: Treating RN: 16-Feb-1946 (74 y.o. [name] Primary Care Wyman Meschke: [name] Other Clinician: Referring Taeshawn Helfman: Treating Ronith Berti/Extender: Genella Rife in Treatment: 24 Encounter Discharge Information Items Post Procedure Vitals Discharge Condition:  Stable Temperature (F): 99 Ambulatory Status: Wheelchair Pulse (bpm): 111 Discharge Destination: Home Respiratory Rate (breaths/min): 18 Transportation: Private Auto Blood Pressure (mmHg): 117/62 Accompanied By: self Schedule Follow-up Appointment: Yes Clinical Summary of Care: Electronic Signature(s) Signed: 07/08/2020 5:09:36 PM By: Deon Pilling Entered By: Deon Pilling on 07/08/2020 10:38:00 -------------------------------------------------------------------------------- Lower Extremity Assessment Details Patient Name: Date of Service: ZAKAYLA, MARTINEC RA E. 07/08/2020 9:00 A M Medical Record Number: [id number] Patient Account Number: [id number] Date of Birth/Sex: Treating RN: 01/21/1946 (74 y.o. [name] Primary Care Conna Terada: [name] Other Clinician: Referring Callie Bunyard: Treating Lainey Nelson/Extender: Genella Rife in Treatment: 24 Edema Assessment Assessed: [Left: No] [Right: No] Edema: [Left: N] [Right: o] Calf Left: Right: Point of Measurement: 35 cm From Medial Instep 37.5 cm Ankle Left: Right: Point of Measurement: 9 cm From Medial Instep 23.5 cm Vascular Assessment Pulses: Dorsalis Pedis Palpable: [Left:Yes] Electronic Signature(s) Signed: 07/08/2020 4:43:25 PM By: [name] RN, BSN Entered By: [name] on 07/08/2020 09:11:55 -------------------------------------------------------------------------------- Multi Wound Chart Details Patient Name: Date of Service: [name] 07/08/2020 9:00 A M Medical Record Number: [id number] Patient Account Number: [id number] Date of Birth/Sex: Treating RN: 24-Apr-1946 (74 y.o. [name] Primary Care King Pinzon: [name] Other Clinician: Referring Federica Allport: Treating Nycere Presley/Extender: Genella Rife in Treatment: 24 Vital Signs Height(in): 66 Pulse(bpm): 111 Weight(lbs): 245 Blood Pressure(mmHg): 117/62 Body Mass Index(BMI):  40 Temperature(F): 99.0 Respiratory Rate(breaths/min): 18 Photos: [1R:No Photos Left, Medial, Plantar Foot] [N/A:N/A N/A] Wound Location: [1R:Gradually Appeared] [N/A:N/A] Wounding Event: [1R:Diabetic Wound/Ulcer of the Lower] [N/A:N/A] Primary Etiology: [1R:Extremity Sleep Apnea, Deep Vein Thrombosis,] [N/A:N/A] Comorbid History: [1R:Hypertension, Colitis, Type II Diabetes, Gout, Osteoarthritis, Neuropathy 05/11/2019] [N/A:N/A] Date Acquired: [1R:24] [N/A:N/A] Weeks of Treatment: [1R:Open] [N/A:N/A] Wound Status: [1R:Yes] [N/A:N/A] Wound Recurrence: [1R:0.9x1.2x0.1] [N/A:N/A] Measurements L x W x D (cm) [1R:0.848] [N/A:N/A] A (cm) : rea [1R:0.085] [N/A:N/A] Volume (cm) : [1R:16.90%] [N/A:N/A] % Reduction in A rea: [1R:79.20%] [N/A:N/A] % Reduction in Volume: [1R:Grade 2] [  N/A:N/A] Classification: [1R:Medium] [N/A:N/A] Exudate A mount: [1R:Serous] [N/A:N/A] Exudate Type: [1R:amber] [N/A:N/A] Exudate Color: [1R:Thickened] [N/A:N/A] Wound Margin: [1R:Medium (34-66%)] [N/A:N/A] Granulation A mount: [1R:Pink] [N/A:N/A] Granulation Quality: [1R:Medium (34-66%)] [N/A:N/A] Necrotic A mount: [1R:Fat Layer (Subcutaneous Tissue): Yes N/A] Exposed Structures: [1R:Fascia: No Tendon: No Muscle: No Joint: No Bone: No Small (1-33%)] [N/A:N/A] Epithelialization: [1R:Debridement - Excisional] [N/A:N/A] Debridement: Pre-procedure Verification/Time Out 09:39 [N/A:N/A] Taken: [1R:Lidocaine 5% topical ointment] [N/A:N/A] Pain Control: [1R:Callus, Subcutaneous, Slough] [N/A:N/A] Tissue Debrided: [1R:Skin/Subcutaneous Tissue] [N/A:N/A] Level: [1R:1.08] [N/A:N/A] Debridement A (sq cm): [1R:rea Curette] [N/A:N/A] Instrument: [1R:Minimum] [N/A:N/A] Bleeding: [1R:Pressure] [N/A:N/A] Hemostasis A chieved: [1R:0] [N/A:N/A] Procedural Pain: [1R:0] [N/A:N/A] Post Procedural Pain: [1R:Procedure was tolerated well] [N/A:N/A] Debridement Treatment Response: [1R:0.9x1.2x0.1] [N/A:N/A] Post Debridement  Measurements L x W x D (cm) [1R:0.085] [N/A:N/A] Post Debridement Volume: (cm) [1R:Debridement] [N/A:N/A] Procedures Performed: [1R:T Contact Cast otal] Treatment Notes Electronic Signature(s) Signed: 07/08/2020 4:47:57 PM By: Linton Ham MD Signed: 07/09/2020 6:12:47 PM By: Levan Hurst RN, BSN Entered By: Linton Ham on 07/08/2020 09:57:33 -------------------------------------------------------------------------------- Multi-Disciplinary Care Plan Details Patient Name: Date of Service: [name] 07/08/2020 9:00 A M Medical Record Number: [id number] Patient Account Number: [id number] Date of Birth/Sex: Treating RN: Jun 29, 1946 (74 y.o. [name] Primary Care Gabriela Irigoyen: [name] Other Clinician: Referring Romelo Sciandra: Treating Temari Schooler/Extender: Genella Rife in Treatment: 24 Active Inactive Wound/Skin Impairment Nursing Diagnoses: Impaired tissue integrity Knowledge deficit related to ulceration/compromised skin integrity Goals: Patient/caregiver will verbalize understanding of skin care regimen Date Initiated: 01/17/2020 Target Resolution Date: 07/26/2020 Goal Status: Active Ulcer/skin breakdown will have a volume reduction of 30% by week 4 Date Initiated: 01/17/2020 Date Inactivated: 02/14/2020 Target Resolution Date: 02/14/2020 Goal Status: Met Ulcer/skin breakdown will have a volume reduction of 50% by week 8 Date Initiated: 02/14/2020 Date Inactivated: 03/13/2020 Target Resolution Date: 03/13/2020 Goal Status: Met Ulcer/skin breakdown will have a volume reduction of 80% by week 12 Date Initiated: 03/13/2020 Date Inactivated: 04/10/2020 Target Resolution Date: 04/10/2020 Goal Status: Unmet Unmet Reason: infection Interventions: Assess patient/caregiver ability to obtain necessary supplies Assess patient/caregiver ability to perform ulcer/skin care regimen upon admission and as needed Assess ulceration(s) every visit Provide  education on ulcer and skin care Treatment Activities: Skin care regimen initiated : 01/17/2020 Topical wound management initiated : 01/17/2020 Notes: Electronic Signature(s) Signed: 07/09/2020 6:12:47 PM By: Levan Hurst RN, BSN Entered By: Levan Hurst on 07/08/2020 09:41:19 -------------------------------------------------------------------------------- Pain Assessment Details Patient Name: Date of Service: [name] 07/08/2020 9:00 A M Medical Record Number: [id number] Patient Account Number: [id number] Date of Birth/Sex: Treating RN: 28-Aug-1945 (74 y.o. [name] Primary Care Denisha Hoel: [name] Other Clinician: Referring Karanveer Ramakrishnan: Treating Mellie Buccellato/Extender: Genella Rife in Treatment: 24 Active Problems Location of Pain Severity and Description of Pain Patient Has Paino No Site Locations Pain Management and Medication Current Pain Management: Electronic Signature(s) Signed: 07/08/2020 9:54:43 AM By: Sandre Kitty Signed: 07/09/2020 6:12:47 PM By: Levan Hurst RN, BSN Entered By: Sandre Kitty on 07/08/2020 09:08:20 -------------------------------------------------------------------------------- Patient/Caregiver Education Details Patient Name: Date of Service: [name] 11/29/2021andnbsp9:00 A M Medical Record Number: [id number] Patient Account Number: [id number] Date of Birth/Gender: Treating RN: 16-Feb-1946 (74 y.o. [name] Primary Care Physician: [name] Other Clinician: Referring Physician: Treating Physician/Extender: Genella Rife in Treatment: 24 Education Assessment Education Provided To: Patient Education Topics Provided Offloading: Methods: Explain/Verbal Responses: State content correctly Wound/Skin Impairment: Methods: Explain/Verbal Responses: State content correctly Electronic Signature(s) Signed: 07/09/2020 6:12:47 PM By:  Levan Hurst RN,  BSN Entered By: Levan Hurst on 07/08/2020 09:42:13 -------------------------------------------------------------------------------- Wound Assessment Details Patient Name: Date of Service: BRECK, HOLLINGER RA E. 07/08/2020 9:00 A M Medical Record Number: [id number] Patient Account Number: [id number] Date of Birth/Sex: Treating RN: Dec 19, 1945 (74 y.o. [name] Primary Care Brooke Steinhilber: [name] Other Clinician: Referring Tema Alire: Treating Ethelean Colla/Extender: Genella Rife in Treatment: 24 Wound Status Wound Number: 1R Primary Diabetic Wound/Ulcer of the Lower Extremity Etiology: Wound Location: Left, Medial, Plantar Foot Wound Open Wounding Event: Gradually Appeared Status: Date Acquired: 05/11/2019 Comorbid Sleep Apnea, Deep Vein Thrombosis, Hypertension, Colitis, Type Weeks Of Treatment: 24 History: II Diabetes, Gout, Osteoarthritis, Neuropathy Clustered Wound: No Photos Photo Uploaded By: Mikeal Hawthorne on 07/09/2020 14:40:53 Wound Measurements Length: (cm) 0.9 Width: (cm) 1.2 Depth: (cm) 0.1 Area: (cm) 0.848 Volume: (cm) 0.085 % Reduction in Area: 16.9% % Reduction in Volume: 79.2% Epithelialization: Small (1-33%) Tunneling: No Undermining: No Wound Description Classification: Grade 2 Wound Margin: Thickened Exudate Amount: Medium Exudate Type: Serous Exudate Color: amber Foul Odor After Cleansing: No Slough/Fibrino No Wound Bed Granulation Amount: Medium (34-66%) Exposed Structure Granulation Quality: Pink Fascia Exposed: No Necrotic Amount: Medium (34-66%) Fat Layer (Subcutaneous Tissue) Exposed: Yes Necrotic Quality: Adherent Slough Tendon Exposed: No Muscle Exposed: No Joint Exposed: No Bone Exposed: No Treatment Notes Wound #1R (Left, Medial, Plantar Foot) 1. Cleanse With Wound Cleanser 2. Periwound Care Moisturizing lotion Skin Prep 3. Primary Dressing Applied Hydrofera Blue 4. Secondary Dressing Dry  Gauze Foam 5. Secured With Medipore tape 7. Footwear/Offloading device applied T Contact Cast otal Notes foam donut as secondary. Last layer of TCC applied by MD. Electronic Signature(s) Signed: 07/08/2020 4:43:25 PM By: [name] RN, BSN Entered By: [name] on 07/08/2020 09:12:41 -------------------------------------------------------------------------------- Gordon Details Patient Name: Date of Service: App, [name] 07/08/2020 9:00 A M Medical Record Number: [id number] Patient Account Number: [id number] Date of Birth/Sex: Treating RN: 21-Dec-1945 (74 y.o. [name] Primary Care Baxter Gonzalez: [name] Other Clinician: Referring Thereasa Iannello: Treating Imani Fiebelkorn/Extender: Genella Rife in Treatment: 24 Vital Signs Time Taken: 09:07 Temperature (F): 99.0 Height (in): 66 Pulse (bpm): 111 Weight (lbs): 245 Respiratory Rate (breaths/min): 18 Body Mass Index (BMI): 39.5 Blood Pressure (mmHg): 117/62 Reference Range: 80 - 120 mg / dl Electronic Signature(s) Signed: 07/08/2020 9:54:43 AM By: Sandre Kitty Entered By: Sandre Kitty on 07/08/2020 09:08:14

## 2020-07-10 HISTORY — PX: EYE SURGERY: SHX253

## 2020-07-11 ENCOUNTER — Encounter (HOSPITAL_BASED_OUTPATIENT_CLINIC_OR_DEPARTMENT_OTHER): Payer: Medicare Other | Admitting: Internal Medicine

## 2020-07-11 DIAGNOSIS — H26491 Other secondary cataract, right eye: Secondary | ICD-10-CM | POA: Diagnosis not present

## 2020-07-12 ENCOUNTER — Emergency Department (HOSPITAL_COMMUNITY): Payer: Medicare Other

## 2020-07-12 ENCOUNTER — Other Ambulatory Visit: Payer: Self-pay

## 2020-07-12 ENCOUNTER — Encounter (HOSPITAL_COMMUNITY): Payer: Self-pay | Admitting: Emergency Medicine

## 2020-07-12 ENCOUNTER — Inpatient Hospital Stay (HOSPITAL_COMMUNITY)
Admission: EM | Admit: 2020-07-12 | Discharge: 2020-07-17 | DRG: 682 | Disposition: A | Discharge status: Home Health | Payer: Medicare Other | Attending: Internal Medicine | Admitting: Internal Medicine

## 2020-07-12 ENCOUNTER — Ambulatory Visit (HOSPITAL_COMMUNITY)
Admission: EM | Admit: 2020-07-12 | Discharge: 2020-07-12 | Disposition: A | Discharge status: Home / Self Care | Payer: Medicare Other

## 2020-07-12 ENCOUNTER — Encounter (HOSPITAL_COMMUNITY): Payer: Self-pay

## 2020-07-12 DIAGNOSIS — R630 Anorexia: Secondary | ICD-10-CM | POA: Diagnosis not present

## 2020-07-12 DIAGNOSIS — E1165 Type 2 diabetes mellitus with hyperglycemia: Secondary | ICD-10-CM | POA: Diagnosis present

## 2020-07-12 DIAGNOSIS — I700 Atherosclerosis of aorta: Secondary | ICD-10-CM | POA: Diagnosis not present

## 2020-07-12 DIAGNOSIS — L409 Psoriasis, unspecified: Secondary | ICD-10-CM | POA: Diagnosis present

## 2020-07-12 DIAGNOSIS — E1169 Type 2 diabetes mellitus with other specified complication: Secondary | ICD-10-CM

## 2020-07-12 DIAGNOSIS — E872 Acidosis: Secondary | ICD-10-CM

## 2020-07-12 DIAGNOSIS — Z91013 Allergy to seafood: Secondary | ICD-10-CM | POA: Diagnosis not present

## 2020-07-12 DIAGNOSIS — R571 Hypovolemic shock: Secondary | ICD-10-CM | POA: Diagnosis present

## 2020-07-12 DIAGNOSIS — E119 Type 2 diabetes mellitus without complications: Secondary | ICD-10-CM

## 2020-07-12 DIAGNOSIS — Z8249 Family history of ischemic heart disease and other diseases of the circulatory system: Secondary | ICD-10-CM

## 2020-07-12 DIAGNOSIS — Z91012 Allergy to eggs: Secondary | ICD-10-CM

## 2020-07-12 DIAGNOSIS — R197 Diarrhea, unspecified: Secondary | ICD-10-CM | POA: Diagnosis not present

## 2020-07-12 DIAGNOSIS — M069 Rheumatoid arthritis, unspecified: Secondary | ICD-10-CM | POA: Diagnosis present

## 2020-07-12 DIAGNOSIS — Z8616 Personal history of COVID-19: Secondary | ICD-10-CM

## 2020-07-12 DIAGNOSIS — M109 Gout, unspecified: Secondary | ICD-10-CM | POA: Diagnosis present

## 2020-07-12 DIAGNOSIS — L97522 Non-pressure chronic ulcer of other part of left foot with fat layer exposed: Secondary | ICD-10-CM | POA: Diagnosis not present

## 2020-07-12 DIAGNOSIS — E8729 Other acidosis: Secondary | ICD-10-CM

## 2020-07-12 DIAGNOSIS — Z794 Long term (current) use of insulin: Secondary | ICD-10-CM | POA: Diagnosis not present

## 2020-07-12 DIAGNOSIS — S92332D Displaced fracture of third metatarsal bone, left foot, subsequent encounter for fracture with routine healing: Secondary | ICD-10-CM

## 2020-07-12 DIAGNOSIS — D849 Immunodeficiency, unspecified: Secondary | ICD-10-CM | POA: Diagnosis present

## 2020-07-12 DIAGNOSIS — E1151 Type 2 diabetes mellitus with diabetic peripheral angiopathy without gangrene: Secondary | ICD-10-CM | POA: Diagnosis not present

## 2020-07-12 DIAGNOSIS — Z7982 Long term (current) use of aspirin: Secondary | ICD-10-CM | POA: Diagnosis not present

## 2020-07-12 DIAGNOSIS — E785 Hyperlipidemia, unspecified: Secondary | ICD-10-CM | POA: Diagnosis present

## 2020-07-12 DIAGNOSIS — R531 Weakness: Secondary | ICD-10-CM

## 2020-07-12 DIAGNOSIS — I10 Essential (primary) hypertension: Secondary | ICD-10-CM | POA: Diagnosis present

## 2020-07-12 DIAGNOSIS — G4733 Obstructive sleep apnea (adult) (pediatric): Secondary | ICD-10-CM | POA: Diagnosis not present

## 2020-07-12 DIAGNOSIS — Z7984 Long term (current) use of oral hypoglycemic drugs: Secondary | ICD-10-CM | POA: Diagnosis not present

## 2020-07-12 DIAGNOSIS — L4050 Arthropathic psoriasis, unspecified: Secondary | ICD-10-CM | POA: Diagnosis present

## 2020-07-12 DIAGNOSIS — X58XXXD Exposure to other specified factors, subsequent encounter: Secondary | ICD-10-CM | POA: Diagnosis present

## 2020-07-12 DIAGNOSIS — L97509 Non-pressure chronic ulcer of other part of unspecified foot with unspecified severity: Secondary | ICD-10-CM | POA: Diagnosis present

## 2020-07-12 DIAGNOSIS — R000 Tachycardia, unspecified: Secondary | ICD-10-CM | POA: Diagnosis not present

## 2020-07-12 DIAGNOSIS — K52832 Lymphocytic colitis: Secondary | ICD-10-CM | POA: Diagnosis present

## 2020-07-12 DIAGNOSIS — E860 Dehydration: Secondary | ICD-10-CM | POA: Diagnosis present

## 2020-07-12 DIAGNOSIS — N179 Acute kidney failure, unspecified: Principal | ICD-10-CM | POA: Diagnosis present

## 2020-07-12 DIAGNOSIS — E11621 Type 2 diabetes mellitus with foot ulcer: Secondary | ICD-10-CM | POA: Diagnosis present

## 2020-07-12 DIAGNOSIS — E1142 Type 2 diabetes mellitus with diabetic polyneuropathy: Secondary | ICD-10-CM | POA: Diagnosis not present

## 2020-07-12 DIAGNOSIS — E1140 Type 2 diabetes mellitus with diabetic neuropathy, unspecified: Secondary | ICD-10-CM

## 2020-07-12 DIAGNOSIS — E11610 Type 2 diabetes mellitus with diabetic neuropathic arthropathy: Secondary | ICD-10-CM | POA: Diagnosis present

## 2020-07-12 DIAGNOSIS — L97529 Non-pressure chronic ulcer of other part of left foot with unspecified severity: Secondary | ICD-10-CM | POA: Diagnosis present

## 2020-07-12 DIAGNOSIS — E1110 Type 2 diabetes mellitus with ketoacidosis without coma: Secondary | ICD-10-CM | POA: Diagnosis not present

## 2020-07-12 DIAGNOSIS — Z882 Allergy status to sulfonamides status: Secondary | ICD-10-CM

## 2020-07-12 DIAGNOSIS — E0810 Diabetes mellitus due to underlying condition with ketoacidosis without coma: Secondary | ICD-10-CM | POA: Diagnosis not present

## 2020-07-12 DIAGNOSIS — K219 Gastro-esophageal reflux disease without esophagitis: Secondary | ICD-10-CM | POA: Diagnosis present

## 2020-07-12 DIAGNOSIS — Z6841 Body Mass Index (BMI) 40.0 and over, adult: Secondary | ICD-10-CM

## 2020-07-12 DIAGNOSIS — R140 Abdominal distension (gaseous): Secondary | ICD-10-CM | POA: Diagnosis not present

## 2020-07-12 DIAGNOSIS — R579 Shock, unspecified: Secondary | ICD-10-CM | POA: Diagnosis present

## 2020-07-12 DIAGNOSIS — D649 Anemia, unspecified: Secondary | ICD-10-CM | POA: Diagnosis present

## 2020-07-12 DIAGNOSIS — Z20822 Contact with and (suspected) exposure to covid-19: Secondary | ICD-10-CM | POA: Diagnosis present

## 2020-07-12 DIAGNOSIS — N289 Disorder of kidney and ureter, unspecified: Secondary | ICD-10-CM | POA: Diagnosis not present

## 2020-07-12 DIAGNOSIS — K5730 Diverticulosis of large intestine without perforation or abscess without bleeding: Secondary | ICD-10-CM | POA: Diagnosis not present

## 2020-07-12 DIAGNOSIS — Z888 Allergy status to other drugs, medicaments and biological substances status: Secondary | ICD-10-CM | POA: Diagnosis not present

## 2020-07-12 DIAGNOSIS — K5900 Constipation, unspecified: Secondary | ICD-10-CM | POA: Diagnosis present

## 2020-07-12 DIAGNOSIS — Z86718 Personal history of other venous thrombosis and embolism: Secondary | ICD-10-CM | POA: Diagnosis not present

## 2020-07-12 DIAGNOSIS — E669 Obesity, unspecified: Secondary | ICD-10-CM | POA: Diagnosis present

## 2020-07-12 DIAGNOSIS — I4510 Unspecified right bundle-branch block: Secondary | ICD-10-CM | POA: Diagnosis present

## 2020-07-12 DIAGNOSIS — Z79899 Other long term (current) drug therapy: Secondary | ICD-10-CM

## 2020-07-12 DIAGNOSIS — R110 Nausea: Secondary | ICD-10-CM

## 2020-07-12 DIAGNOSIS — I82409 Acute embolism and thrombosis of unspecified deep veins of unspecified lower extremity: Secondary | ICD-10-CM | POA: Diagnosis present

## 2020-07-12 LAB — CBG MONITORING, ED
Glucose-Capillary: 147 mg/dL — ABNORMAL HIGH (ref 70–99)
Glucose-Capillary: 152 mg/dL — ABNORMAL HIGH (ref 70–99)
Glucose-Capillary: 156 mg/dL — ABNORMAL HIGH (ref 70–99)
Glucose-Capillary: 175 mg/dL — ABNORMAL HIGH (ref 70–99)
Glucose-Capillary: 189 mg/dL — ABNORMAL HIGH (ref 70–99)

## 2020-07-12 LAB — BASIC METABOLIC PANEL
Anion gap: 21 — ABNORMAL HIGH (ref 5–15)
Anion gap: 19 — ABNORMAL HIGH (ref 5–15)
Anion gap: 14 (ref 5–15)
BUN: 26 mg/dL — ABNORMAL HIGH (ref 8–23)
BUN: 29 mg/dL — ABNORMAL HIGH (ref 8–23)
BUN: 27 mg/dL — ABNORMAL HIGH (ref 8–23)
CO2: 20 mmol/L — ABNORMAL LOW (ref 22–32)
CO2: 20 mmol/L — ABNORMAL LOW (ref 22–32)
CO2: 23 mmol/L (ref 22–32)
Calcium: 9.4 mg/dL (ref 8.9–10.3)
Calcium: 9.2 mg/dL (ref 8.9–10.3)
Calcium: 8.8 mg/dL — ABNORMAL LOW (ref 8.9–10.3)
Chloride: 88 mmol/L — ABNORMAL LOW (ref 98–111)
Chloride: 87 mmol/L — ABNORMAL LOW (ref 98–111)
Chloride: 93 mmol/L — ABNORMAL LOW (ref 98–111)
Creatinine, Ser: 3.21 mg/dL — ABNORMAL HIGH (ref 0.44–1.00)
Creatinine, Ser: 3.67 mg/dL — ABNORMAL HIGH (ref 0.44–1.00)
Creatinine, Ser: 3.37 mg/dL — ABNORMAL HIGH (ref 0.44–1.00)
GFR, Estimated: 15 mL/min — ABNORMAL LOW (ref >60)
GFR, Estimated: 12 mL/min — ABNORMAL LOW (ref >60)
GFR, Estimated: 14 mL/min — ABNORMAL LOW (ref >60)
Glucose, Bld: 249 mg/dL — ABNORMAL HIGH (ref 70–99)
Glucose, Bld: 230 mg/dL — ABNORMAL HIGH (ref 70–99)
Glucose, Bld: 172 mg/dL — ABNORMAL HIGH (ref 70–99)
Potassium: 4.5 mmol/L (ref 3.5–5.1)
Potassium: 4.6 mmol/L (ref 3.5–5.1)
Potassium: 4.6 mmol/L (ref 3.5–5.1)
Sodium: 129 mmol/L — ABNORMAL LOW (ref 135–145)
Sodium: 126 mmol/L — ABNORMAL LOW (ref 135–145)
Sodium: 130 mmol/L — ABNORMAL LOW (ref 135–145)

## 2020-07-12 LAB — CBC
HCT: 38.1 % (ref 36.0–46.0)
HCT: 31 % — ABNORMAL LOW (ref 36.0–46.0)
Hemoglobin: 11.6 g/dL — ABNORMAL LOW (ref 12.0–15.0)
Hemoglobin: 9.8 g/dL — ABNORMAL LOW (ref 12.0–15.0)
MCH: 28.3 pg (ref 26.0–34.0)
MCH: 28.8 pg (ref 26.0–34.0)
MCHC: 30.4 g/dL (ref 30.0–36.0)
MCHC: 31.6 g/dL (ref 30.0–36.0)
MCV: 92.9 fL (ref 80.0–100.0)
MCV: 91.2 fL (ref 80.0–100.0)
Platelets: 319 10*3/uL (ref 150–400)
Platelets: 223 10*3/uL (ref 150–400)
RBC: 4.1 MIL/uL (ref 3.87–5.11)
RBC: 3.4 MIL/uL — ABNORMAL LOW (ref 3.87–5.11)
RDW: 17.9 % — ABNORMAL HIGH (ref 11.5–15.5)
RDW: 17.8 % — ABNORMAL HIGH (ref 11.5–15.5)
WBC: 17.1 10*3/uL — ABNORMAL HIGH (ref 4.0–10.5)
WBC: 12.8 10*3/uL — ABNORMAL HIGH (ref 4.0–10.5)
nRBC: 0 % (ref 0.0–0.2)
nRBC: 0 % (ref 0.0–0.2)

## 2020-07-12 LAB — URINALYSIS, ROUTINE W REFLEX MICROSCOPIC
Bilirubin Urine: NEGATIVE
Glucose, UA: NEGATIVE mg/dL
Hgb urine dipstick: NEGATIVE
Ketones, ur: NEGATIVE mg/dL
Leukocytes,Ua: NEGATIVE
Nitrite: NEGATIVE
Protein, ur: 30 mg/dL — AB
Specific Gravity, Urine: 1.017 (ref 1.005–1.030)
pH: 5 (ref 5.0–8.0)

## 2020-07-12 LAB — LACTIC ACID, PLASMA
Lactic Acid, Venous: 5.8 mmol/L (ref 0.5–1.9)
Lactic Acid, Venous: 4.5 mmol/L (ref 0.5–1.9)

## 2020-07-12 LAB — RESP PANEL BY RT-PCR (FLU A&B, COVID) ARPGX2
Influenza A by PCR: NEGATIVE
Influenza B by PCR: NEGATIVE
SARS Coronavirus 2 by RT PCR: NEGATIVE

## 2020-07-12 LAB — BETA-HYDROXYBUTYRIC ACID: Beta-Hydroxybutyric Acid: 0.8 mmol/L — ABNORMAL HIGH (ref 0.05–0.27)

## 2020-07-12 MED ORDER — LACTATED RINGERS IV BOLUS
20.0000 mL/kg | Freq: Once | INTRAVENOUS | Status: AC
  Administered 2020-07-12: 2258 mL via INTRAVENOUS

## 2020-07-12 MED ORDER — PREDNISOLONE ACETATE 1 % OP SUSP
1.0000 [drp] | Freq: Three times a day (TID) | OPHTHALMIC | Status: DC
  Administered 2020-07-13 – 2020-07-17 (×18): 1 [drp] via OPHTHALMIC
  Filled 2020-07-12: qty 5

## 2020-07-12 MED ORDER — ASPIRIN EC 81 MG PO TBEC
81.0000 mg | DELAYED_RELEASE_TABLET | ORAL | Status: DC
  Administered 2020-07-13 – 2020-07-16 (×3): 81 mg via ORAL
  Filled 2020-07-12 (×4): qty 1

## 2020-07-12 MED ORDER — LACTATED RINGERS IV SOLN: INTRAVENOUS | Status: DC

## 2020-07-12 MED ORDER — INSULIN ASPART 100 UNIT/ML ~~LOC~~ SOLN
0.0000 [IU] | SUBCUTANEOUS | Status: DC
  Administered 2020-07-13: 9 [IU] via SUBCUTANEOUS
  Administered 2020-07-13 (×2): 2 [IU] via SUBCUTANEOUS
  Administered 2020-07-13: 9 [IU] via SUBCUTANEOUS
  Administered 2020-07-13 (×2): 2 [IU] via SUBCUTANEOUS
  Administered 2020-07-14 (×5): 9 [IU] via SUBCUTANEOUS
  Administered 2020-07-14: 5 [IU] via SUBCUTANEOUS
  Administered 2020-07-14: 7 [IU] via SUBCUTANEOUS
  Administered 2020-07-15 (×2): 5 [IU] via SUBCUTANEOUS

## 2020-07-12 MED ORDER — ACETAMINOPHEN 325 MG PO TABS
650.0000 mg | ORAL_TABLET | Freq: Four times a day (QID) | ORAL | Status: DC | PRN
  Administered 2020-07-14 – 2020-07-16 (×4): 650 mg via ORAL
  Filled 2020-07-12 (×4): qty 2

## 2020-07-12 MED ORDER — VANCOMYCIN HCL IN DEXTROSE 1-5 GM/200ML-% IV SOLN
1000.0000 mg | Freq: Once | INTRAVENOUS | Status: AC
  Administered 2020-07-12: 1000 mg via INTRAVENOUS
  Filled 2020-07-12: qty 200

## 2020-07-12 MED ORDER — ROSUVASTATIN CALCIUM 5 MG PO TABS
5.0000 mg | ORAL_TABLET | Freq: Every day | ORAL | Status: DC
  Administered 2020-07-13 – 2020-07-16 (×5): 5 mg via ORAL
  Filled 2020-07-12 (×5): qty 1

## 2020-07-12 MED ORDER — ENOXAPARIN SODIUM 30 MG/0.3ML ~~LOC~~ SOLN
30.0000 mg | Freq: Every day | SUBCUTANEOUS | Status: DC
  Administered 2020-07-13 – 2020-07-15 (×3): 30 mg via SUBCUTANEOUS
  Filled 2020-07-12 (×3): qty 0.3

## 2020-07-12 MED ORDER — BUDESONIDE 3 MG PO CPEP
9.0000 mg | ORAL_CAPSULE | Freq: Every day | ORAL | Status: DC
  Administered 2020-07-13 – 2020-07-17 (×5): 9 mg via ORAL
  Filled 2020-07-12 (×5): qty 3

## 2020-07-12 MED ORDER — VANCOMYCIN HCL IN DEXTROSE 1-5 GM/200ML-% IV SOLN
1000.0000 mg | Freq: Once | INTRAVENOUS | Status: AC
  Administered 2020-07-13: 1000 mg via INTRAVENOUS
  Filled 2020-07-12 (×2): qty 200

## 2020-07-12 MED ORDER — SODIUM CHLORIDE 0.9 % IV SOLN
2.0000 g | Freq: Every day | INTRAVENOUS | Status: DC
  Administered 2020-07-13 – 2020-07-14 (×3): 2 g via INTRAVENOUS
  Filled 2020-07-12 (×5): qty 2

## 2020-07-12 MED ORDER — ACETAMINOPHEN 650 MG RE SUPP: 650.0000 mg | Freq: Four times a day (QID) | RECTAL | Status: DC | PRN

## 2020-07-12 MED ORDER — DEXTROSE 50 % IV SOLN: 0.0000 mL | INTRAVENOUS | Status: DC | PRN

## 2020-07-12 MED ORDER — INSULIN REGULAR(HUMAN) IN NACL 100-0.9 UT/100ML-% IV SOLN
INTRAVENOUS | Status: DC
  Administered 2020-07-12: 2.6 [IU]/h via INTRAVENOUS
  Filled 2020-07-12: qty 100

## 2020-07-12 MED ORDER — MELATONIN 5 MG PO TABS
5.0000 mg | ORAL_TABLET | Freq: Every day | ORAL | Status: DC
  Administered 2020-07-13 – 2020-07-16 (×5): 5 mg via ORAL
  Filled 2020-07-12 (×5): qty 1

## 2020-07-12 MED ORDER — PREDNISOLONE ACETATE 1 % OP SUSP
1.0000 [drp] | Freq: Two times a day (BID) | OPHTHALMIC | Status: DC
  Administered 2020-07-13: 1 [drp] via OPHTHALMIC

## 2020-07-12 MED ORDER — VANCOMYCIN VARIABLE DOSE PER UNSTABLE RENAL FUNCTION (PHARMACIST DOSING): Status: DC

## 2020-07-12 MED ORDER — ONDANSETRON HCL 4 MG/2ML IJ SOLN
4.0000 mg | Freq: Once | INTRAMUSCULAR | Status: AC
  Administered 2020-07-12: 4 mg via INTRAVENOUS
  Filled 2020-07-12: qty 2

## 2020-07-12 MED ORDER — CITALOPRAM HYDROBROMIDE 20 MG PO TABS
40.0000 mg | ORAL_TABLET | Freq: Every day | ORAL | Status: DC
  Administered 2020-07-13 – 2020-07-17 (×5): 40 mg via ORAL
  Filled 2020-07-12 (×3): qty 2
  Filled 2020-07-12: qty 4
  Filled 2020-07-12: qty 2

## 2020-07-12 MED ORDER — ALLOPURINOL 100 MG PO TABS
100.0000 mg | ORAL_TABLET | Freq: Two times a day (BID) | ORAL | Status: DC
  Administered 2020-07-13 – 2020-07-17 (×10): 100 mg via ORAL
  Filled 2020-07-12 (×11): qty 1

## 2020-07-12 MED ORDER — ASCORBIC ACID 500 MG PO TABS
500.0000 mg | ORAL_TABLET | Freq: Every day | ORAL | Status: DC
  Administered 2020-07-13 – 2020-07-17 (×5): 500 mg via ORAL
  Filled 2020-07-12 (×5): qty 1

## 2020-07-12 MED ORDER — POTASSIUM CHLORIDE 10 MEQ/100ML IV SOLN
10.0000 meq | INTRAVENOUS | Status: AC
  Administered 2020-07-12 (×2): 10 meq via INTRAVENOUS
  Filled 2020-07-12 (×2): qty 100

## 2020-07-12 MED ORDER — SODIUM CHLORIDE 0.9 % IV BOLUS
1000.0000 mL | Freq: Once | INTRAVENOUS | Status: AC
  Administered 2020-07-12: 1000 mL via INTRAVENOUS

## 2020-07-12 MED ORDER — PIPERACILLIN-TAZOBACTAM 3.375 G IVPB 30 MIN
3.3750 g | Freq: Once | INTRAVENOUS | Status: AC
  Administered 2020-07-12: 3.375 g via INTRAVENOUS
  Filled 2020-07-12: qty 50

## 2020-07-12 MED ORDER — FOLIC ACID 1 MG PO TABS
1.0000 mg | ORAL_TABLET | Freq: Every day | ORAL | Status: DC
  Administered 2020-07-13 – 2020-07-17 (×5): 1 mg via ORAL
  Filled 2020-07-12 (×5): qty 1

## 2020-07-12 MED ORDER — DEXTROSE IN LACTATED RINGERS 5 % IV SOLN: INTRAVENOUS | Status: DC

## 2020-07-12 MED ORDER — ALPRAZOLAM 0.25 MG PO TABS
0.2500 mg | ORAL_TABLET | Freq: Two times a day (BID) | ORAL | Status: DC | PRN
  Administered 2020-07-13 – 2020-07-16 (×5): 0.25 mg via ORAL
  Filled 2020-07-12 (×6): qty 1

## 2020-07-12 MED ORDER — METRONIDAZOLE IN NACL 5-0.79 MG/ML-% IV SOLN
500.0000 mg | Freq: Three times a day (TID) | INTRAVENOUS | Status: DC
  Administered 2020-07-13 – 2020-07-15 (×9): 500 mg via INTRAVENOUS
  Filled 2020-07-12 (×10): qty 100

## 2020-07-12 MED ORDER — LACTATED RINGERS IV BOLUS
1000.0000 mL | Freq: Once | INTRAVENOUS | Status: AC
  Administered 2020-07-12: 1000 mL via INTRAVENOUS

## 2020-07-12 MED ORDER — PREDNISOLONE ACETATE 1 % OP SUSP: 4.0000 [drp] | OPHTHALMIC | Status: DC

## 2020-07-12 MED ORDER — SODIUM CHLORIDE 0.9 % IV BOLUS: 1000.0000 mL | Freq: Once | INTRAVENOUS | Status: DC

## 2020-07-12 NOTE — ED Notes (Signed)
Spoke to admitting provider and was told to stop d5lr and insulin drip. Provider stated he would order home meds and come see the patient shortly.

## 2020-07-12 NOTE — ED Triage Notes (Signed)
Patient presents to [facility] for assessment of altered mental status, weakness, loss of appetite, extreme fatigue.

## 2020-07-12 NOTE — Progress Notes (Signed)
Pharmacy Antibiotic Note  LILAS DIEFENDORF is a 74 y.o. female admitted on 07/12/2020 with weakness, AKI/dehydration, and possible sepsis.  Pharmacy has been consulted for Vancomycin and Cefepime  Dosing.  Vancomycin 1 g IV given in ED at 2245  Plan: Vancomycin 1 g IV now for total of 2000 mg tonight F/U renal function and redose as indicated Cefepime 2 g IV q24h  Height: 5\' 6"  (167.6 cm) Weight: 112.9 kg (249 lb) IBW/kg (Calculated) : 59.3  Temp (24hrs), Avg:98.1 F (36.7 C), Min:98 F (36.7 C), Max:98.2 F (36.8 C)  Recent Labs  Lab 07/12/20 1140 07/12/20 1725 07/12/20 1730 07/12/20 1938  WBC 17.1*  --   --   --   CREATININE 3.21*  --  3.67* 3.37*  LATICACIDVEN  --  5.8*  --  4.5*    Estimated Creatinine Clearance: 18.7 mL/min (A) (by C-G formula based on SCr of 3.37 mg/dL (H)).    Allergies  Allergen Reactions  . Other Shortness Of Breath    Blue fish: palms and feet turn red  . Bee Venom Swelling  . Wilder Glade [Dapagliflozin]     Recurrent yeast infections  . Sulfa Antibiotics Hives    Caryl Pina 07/12/2020 11:05 PM

## 2020-07-12 NOTE — H&P (Addendum)
History and Physical    [name] [name]:[id number] DOB: [birth year] DOA: 07/12/2020  PCP: Binnie Rail, MD  Patient coming from: Home.  Chief Complaint: Weakness and nausea vomiting.  HPI: [name] [name] is a 74 y.o. female with history of diabetes mellitus type 2, hypertension, lymphocytic colitis, psoriatic and rheumatoid arthritis on immunosuppressants, sleep apnea previous history of DVT presents to the ER after patient was feeling weak and dizzy with persistent nausea with some vomiting unable to eat well since Thanksgiving almost a week now.  Denies any fever chills.  Denies chest pain or shortness of breath.  Denies diarrhea.  Has had some abdominal discomfort in the left lower quadrant which has improved.  Denies any blood in the vomitus.  ED Course: In the ER patient was hypotensive with blood pressure systolic in the 21Y with lactate of 5.8 patient was given 3 days fluid bolus blood cultures obtained started on empiric antibiotics for possible sepsis.  Anion gap was around 19 and his blood sugar was slightly elevated and with anion gap acidosis initially was started on insulin infusion.  Patient's creatinine is significantly worsened from recent past in August it was 0.7 now it is 3.2 anion gap improved lactic acidosis also improved with fluids.  Insulin was discontinued.  CT abdomen pelvis was unremarkable.  UA shows no definite evidence of any infection.  Patient does have a left lower extremity wound on the plantar aspect but patient does not want to remove her dressing.  Patient admitted for acute renal failure with dehydration with hypotension.  Review of Systems: As per HPI, rest all negative.   Past Medical History:  Diagnosis Date  . Arthritis   . Closed fracture of fifth metatarsal bone 05/13/2018  . Deep vein thrombosis ([place])    right calf - 05/2012   . Diabetes mellitus without complication (HCC)    diet controlled   . GERD (gastroesophageal reflux disease)   .  Hyperlipidemia   . Hypertension    Ejection fraction =>55% Left ventricular systolic function is normal. Left ventricular wall motion is normal    . Lymphocytic colitis   . Neuropathy    diabetic - in bilateral feet   . Pityriasis lichenoides chronica   . Sleep apnea    bipap    Past Surgical History:  Procedure Laterality Date  . DILATION AND CURETTAGE OF UTERUS    . EYE SURGERY  07/2020  . HAMMER TOE SURGERY    . right hand surgery      due to blood infection   . torn meniscus repair      right knee   . TOTAL HIP ARTHROPLASTY  09/13/2012   Procedure: TOTAL HIP ARTHROPLASTY ANTERIOR APPROACH;  Surgeon: Mauri Pole, MD;  Location: [facility];  Service: Orthopedics;  Laterality: Right;     reports that she has never smoked. She has never used smokeless tobacco. She reports current alcohol use. She reports that she does not use drugs.  Allergies  Allergen Reactions  . Other Shortness Of Breath    Blue fish: palms and feet turn red  . Bee Venom Swelling  . Wilder Glade [Dapagliflozin]     Recurrent yeast infections  . Sulfa Antibiotics Hives    Family History  Problem Relation Age of Onset  . Hypertension Mother     Prior to Admission medications   Medication Sig Start Date End Date Taking? Authorizing Provider  acetaminophen (TYLENOL) 325 MG tablet Take 2 tablets (650 mg  total) by mouth every 6 (six) hours as needed for mild pain or headache. 07/13/19  Yes Elgergawy, Silver Huguenin, MD  allopurinol (ZYLOPRIM) 100 MG tablet Take 1 tablet (100 mg total) by mouth 2 (two) times daily. 04/21/20  Yes Burns, Claudina Lick, MD  ALPRAZolam Duanne Moron) 0.25 MG tablet Take 1 tablet (0.25 mg total) by mouth 2 (two) times daily as needed for anxiety. 11/13/19  Yes Burns, Claudina Lick, MD  ascorbic acid (VITAMIN C) 500 MG tablet Take 500 mg by mouth at bedtime.   Yes [provider]  aspirin 81 MG tablet Take 81 mg by mouth See admin instructions. Every 3 days   Yes [provider]  azelastine  (ASTELIN) 0.1 % nasal spray Place 2 sprays into both nostrils 2 (two) times daily. Use in each nostril as directed Patient taking differently: Place 2 sprays into both nostrils at bedtime. Use in each nostril as directed 12/26/19  Yes Burns, Claudina Lick, MD  Budesonide (UCERIS) 9 MG TB24 Take 9 mg by mouth daily.    Yes [provider]  Certolizumab Pegol (CIMZIA [place]) Inject 400 mg into the skin every 30 (thirty) days. 200 mg on each side of stomach   Yes [provider]  Cholecalciferol (VITAMIN D3) 1000 UNITS CAPS Take 1,000 Units by mouth 2 (two) times daily.    Yes [provider]  citalopram (CELEXA) 40 MG tablet TAKE 1 TABLET BY MOUTH EVERY DAY Patient taking differently: Take 40 mg by mouth daily.  04/22/20  Yes Burns, Claudina Lick, MD  Cyanocobalamin (VITAMIN B-12) 1000 MCG SUBL Place 1,000 mcg under the tongue daily.   Yes [provider]  cyclobenzaprine (FLEXERIL) 10 MG tablet TAKE 1 TABLET BY MOUTH AT BEDTIME Patient taking differently: Take 10 mg by mouth at bedtime.  06/17/20  Yes Burns, Claudina Lick, MD  docusate sodium (COLACE) 100 MG capsule Take 1 capsule (100 mg total) by mouth 2 (two) times daily. 08/30/19  Yes Burns, Claudina Lick, MD  folic acid (FOLVITE) 1 MG tablet TAKE 1 TABLET(1 MG) BY MOUTH DAILY Patient taking differently: Take 1 mg by mouth daily.  04/22/20  Yes Burns, Claudina Lick, MD  Insulin Pen Needle (B-D UF III MINI PEN NEEDLES) 31G X 5 MM MISC Use daily for insulin pen 01/23/20  Yes Burns, Claudina Lick, MD  LEVEMIR FLEXTOUCH 100 UNIT/ML FlexPen ADMINISTER 20 UNITS UNDER THE SKIN TWICE DAILY Patient taking differently: Inject 20 Units into the skin 2 (two) times daily.  04/05/20  Yes Burns, Claudina Lick, MD  lisinopril-hydrochlorothiazide (ZESTORETIC) 20-25 MG tablet Take 1 tablet by mouth daily. 05/02/20  Yes Burns, Claudina Lick, MD  loperamide (IMODIUM) 2 MG capsule Take 6 mg by mouth 2 (two) times daily.   Yes [provider]  Melatonin 5 MG SUBL Place 5 mg under  the tongue at bedtime.   Yes [provider]  metFORMIN (GLUCOPHAGE-XR) 500 MG 24 hr tablet Take 2 tablets (1,000 mg total) by mouth 2 (two) times daily before a meal. 06/19/20  Yes Burns, Claudina Lick, MD  methotrexate (RHEUMATREX) 2.5 MG tablet Take 20 mg by mouth every Friday.  05/10/20  Yes [provider]  naftifine (NAFTIN) 1 % cream Apply 1 application topically daily as needed (rash).  06/27/20  Yes [provider]  omeprazole (PRILOSEC) 40 MG capsule Take 40 mg by mouth at bedtime.   Yes [provider]  potassium chloride (KLOR-CON) 10 MEQ tablet TAKE 2 TABLETS(20 MEQ) BY MOUTH DAILY  Patient taking differently: Take 40 mEq by mouth daily.  04/22/20  Yes Burns, Claudina Lick, MD  prednisoLONE acetate (PRED FORTE) 1 % ophthalmic suspension Place 4 drops into the right eye See admin instructions. 4 drops  daily for 1 week, 2 drops daily until post op. Surgery 07/11/20 07/11/20  Yes [provider]  Probiotic Product (ALIGN PO) Take 1 capsule by mouth daily.   Yes [provider]  rosuvastatin (CRESTOR) 5 MG tablet Take 1 tablet (5 mg total) by mouth daily. Patient taking differently: Take 5 mg by mouth at bedtime.  04/05/20  Yes Burns, Claudina Lick, MD  triamcinolone cream (KENALOG) 0.1 % Apply 1 application topically 2 (two) times daily. Patient taking differently: Apply 1 application topically 2 (two) times daily as needed (rash).  09/18/19  Yes Burns, Claudina Lick, MD  cephALEXin (KEFLEX) 500 MG capsule Take 1 capsule (500 mg total) by mouth 2 (two) times daily. Patient not taking: Reported on 07/12/2020 06/08/20   Binnie Rail, MD  ciprofloxacin (CIPRO) 250 MG tablet Take 1 tablet (250 mg total) by mouth 2 (two) times daily. Patient not taking: Reported on 07/12/2020 06/23/20   Binnie Rail, MD    Physical Exam: Constitutional: Moderately built and nourished. Vitals:   07/12/20 1830 07/12/20 2100 07/12/20 2130 07/12/20 2200  BP: (!) 105/37 (!) 142/84  134/73 (!) 141/81  Pulse: 94 95 96 97  Resp: 14 18 17 16   Temp:      TempSrc:      SpO2: 98% 99% 100% 98%  Weight:      Height:       Eyes: Anicteric no pallor. ENMT: No discharge from the ears eyes nose or mouth. Neck: No mass felt.  No neck rigidity. Respiratory: No rhonchi or crepitations. Cardiovascular: S1-S2 heard. Abdomen: Soft nontender bowel sounds present. Musculoskeletal: Left leg is in the dressing. Skin: Left leg is in dressing. Neurologic: Alert awake oriented to time place and person.  Moves all extremities. Psychiatric: Appears normal.  Normal affect.   Labs on Admission: I have personally reviewed following labs and imaging studies  CBC: Recent Labs  Lab 07/12/20 1140  WBC 17.1*  HGB 11.6*  HCT 38.1  MCV 92.9  PLT 630   Basic Metabolic Panel: Recent Labs  Lab 07/12/20 1140 07/12/20 1730 07/12/20 1938  NA 129* 126* 130*  K 4.5 4.6 4.6  CL 88* 87* 93*  CO2 20* 20* 23  GLUCOSE 249* 230* 172*  BUN 26* 29* 27*  CREATININE 3.21* 3.67* 3.37*  CALCIUM 9.4 9.2 8.8*   GFR: Estimated Creatinine Clearance: 18.7 mL/min (A) (by C-G formula based on SCr of 3.37 mg/dL (H)). Liver Function Tests: No results for input(s): AST, ALT, ALKPHOS, BILITOT, PROT, ALBUMIN in the last 168 hours. No results for input(s): LIPASE, AMYLASE in the last 168 hours. No results for input(s): AMMONIA in the last 168 hours. Coagulation Profile: No results for input(s): INR, PROTIME in the last 168 hours. Cardiac Enzymes: No results for input(s): CKTOTAL, CKMB, CKMBINDEX, TROPONINI in the last 168 hours. BNP (last 3 results) No results for input(s): PROBNP in the last 8760 hours. HbA1C: No results for input(s): HGBA1C in the last 72 hours. CBG: Recent Labs  Lab 07/12/20 1925 07/12/20 2105 07/12/20 2142 07/12/20 2240  GLUCAP 189* 147* 175* 152*   Lipid Profile: No results for input(s): CHOL, HDL, LDLCALC, TRIG, CHOLHDL, LDLDIRECT in the last 72 hours. Thyroid Function  Tests: No results for input(s): TSH, T4TOTAL, FREET4,  T3FREE, THYROIDAB in the last 72 hours. Anemia Panel: No results for input(s): VITAMINB12, FOLATE, FERRITIN, TIBC, IRON, RETICCTPCT in the last 72 hours. Urine analysis:    Component Value Date/Time   COLORURINE AMBER (A) 07/12/2020 1725   APPEARANCEUR HAZY (A) 07/12/2020 1725   LABSPEC 1.017 07/12/2020 1725   PHURINE 5.0 07/12/2020 1725   GLUCOSEU NEGATIVE 07/12/2020 1725   HGBUR NEGATIVE 07/12/2020 1725   BILIRUBINUR NEGATIVE 07/12/2020 1725   KETONESUR NEGATIVE 07/12/2020 1725   PROTEINUR 30 (A) 07/12/2020 1725   UROBILINOGEN 0.2 06/08/2020 1314   NITRITE NEGATIVE 07/12/2020 1725   LEUKOCYTESUR NEGATIVE 07/12/2020 1725   Sepsis Labs: @LABRCNTIP (procalcitonin:4,lacticidven:4) ) Recent Results (from the past 240 hour(s))  Resp Panel by RT-PCR (Flu A&B, Covid) Nasopharyngeal Swab     Status: None   Collection Time: 07/12/20  5:00 PM   Specimen: Nasopharyngeal Swab; Nasopharyngeal(NP) swabs in vial transport medium  Result Value Ref Range Status   SARS Coronavirus 2 by RT PCR NEGATIVE NEGATIVE Final    Comment: (NOTE) SARS-CoV-2 target nucleic acids are NOT DETECTED.  The SARS-CoV-2 RNA is generally detectable in upper respiratory specimens during the acute phase of infection. The lowest concentration of SARS-CoV-2 viral copies this assay can detect is 138 copies/mL. A negative result does not preclude SARS-Cov-2 infection and should not be used as the sole basis for treatment or other patient management decisions. A negative result may occur with  improper specimen collection/handling, submission of specimen other than nasopharyngeal swab, presence of viral mutation(s) within the areas targeted by this assay, and inadequate number of viral copies(<138 copies/mL). A negative result must be combined with clinical observations, patient history, and epidemiological information. The expected result is Negative.  Fact Sheet  for Patients:  EntrepreneurPulse.com.au  Fact Sheet for Healthcare Providers:  IncredibleEmployment.be  This test is no t yet approved or cleared by the [place] FDA and  has been authorized for detection and/or diagnosis of SARS-CoV-2 by FDA under an Emergency Use Authorization (EUA). This EUA will remain  in effect (meaning this test can be used) for the duration of the COVID-19 declaration under Section 564(b)(1) of the Act, 21 U.S.C.section 360bbb-3(b)(1), unless the authorization is terminated  or revoked sooner.       Influenza A by PCR NEGATIVE NEGATIVE Final   Influenza B by PCR NEGATIVE NEGATIVE Final    Comment: (NOTE) The Xpert Xpress SARS-CoV-2/FLU/RSV plus assay is intended as an aid in the diagnosis of influenza from Nasopharyngeal swab specimens and should not be used as a sole basis for treatment. Nasal washings and aspirates are unacceptable for Xpert Xpress SARS-CoV-2/FLU/RSV testing.  Fact Sheet for Patients: EntrepreneurPulse.com.au  Fact Sheet for Healthcare Providers: IncredibleEmployment.be  This test is not yet approved or cleared by the [place] FDA and has been authorized for detection and/or diagnosis of SARS-CoV-2 by FDA under an Emergency Use Authorization (EUA). This EUA will remain in effect (meaning this test can be used) for the duration of the COVID-19 declaration under Section 564(b)(1) of the Act, 21 U.S.C. section 360bbb-3(b)(1), unless the authorization is terminated or revoked.  Performed at [facility], [place] [address] [place], [place] [address]      Radiological Exams on Admission: CT Abdomen Pelvis Wo Contrast  Result Date: 07/12/2020 CLINICAL DATA:  Abdominal distension, anorexia EXAM: CT ABDOMEN AND PELVIS WITHOUT CONTRAST TECHNIQUE: Multidetector CT imaging of the abdomen and pelvis was performed following the standard protocol without IV  contrast. COMPARISON:  11/02/2018 FINDINGS: Lower  chest: No acute pleural or parenchymal lung disease. Hepatobiliary: No focal liver abnormality is seen. No gallstones, gallbladder wall thickening, or biliary dilatation. Pancreas: Unremarkable. No pancreatic ductal dilatation or surrounding inflammatory changes. Spleen: Normal in size without focal abnormality. Adrenals/Urinary Tract: No urinary tract calculi or obstructive uropathy. The bladder is unremarkable. The adrenals are normal. Stomach/Bowel: No bowel obstruction or ileus. Diverticulosis of the colon with no evidence of acute diverticulitis. Normal appendix right lower quadrant. No bowel wall thickening or inflammatory change. Vascular/Lymphatic: Aortic atherosclerosis. No enlarged abdominal or pelvic lymph nodes. Reproductive: Uterus and bilateral adnexa are unremarkable. Other: No free fluid or free gas.  No abdominal wall hernia. Musculoskeletal: Postsurgical changes from right hip arthroplasty. No acute or destructive bony lesions. Reconstructed images demonstrate no additional findings. IMPRESSION: 1. Diverticulosis without diverticulitis. 2. No acute intra-abdominal or intrapelvic process. 3.  Aortic Atherosclerosis (ICD10-I70.0). Electronically Signed   By: Randa Ngo M.D.   On: 07/12/2020 20:48   DG Chest Port 1 View  Result Date: 07/12/2020 CLINICAL DATA:  Weakness. EXAM: PORTABLE CHEST 1 VIEW COMPARISON:  None. FINDINGS: The heart size and mediastinal contours are within normal limits. Both lungs are clear. The visualized skeletal structures are unremarkable. IMPRESSION: No active disease. Electronically Signed   By: Dorise Bullion III M.D   On: 07/12/2020 17:38    EKG: Independently reviewed.  Normal sinus rhythm RBBB.  Assessment/Plan Principal Problem:   ARF (acute renal failure) (HCC) Active Problems:   OSA (obstructive sleep apnea), BiPAP   Essential hypertension   DM2 (diabetes mellitus, type 2) (HCC)   Psoriasis    Diabetic foot ulcer (HCC)   Deep venous thrombosis of lower extremity (HCC)   Lymphocytic-plasmacytic colitis   Increased anion gap metabolic acidosis    1. Acute renal failure likely from dehydration and poor oral intake.  CT abdomen pelvis does not show any obstruction.  UA is largely unremarkable except for hyaline casts.  We will continue with aggressive hydration hold lisinopril hydrochlorothiazide follow intake output metabolic panel.  I think patient's creatinine will improve with hydration. 2. Nausea and vomiting cause not clear CT abdomen pelvis without contrast is unremarkable.  No signs of any inflammation in the CAT scan.  Advance diet as tolerated.  If patient's nausea vomiting persist may consult GI. 3. History of hypertension presently holding outpatient lisinopril hydrochlorothiazide due to hypotension and renal failure.  As needed IV hydralazine.  Follow blood pressure trends. 4. Lactic acidosis and hypotension likely from dehydration. Check cortisol and stress dose steroids since patient was taking budesonide. No definite signs of any infection.  Since patient has leukocytosis empiric antibiotics were started after blood cultures were obtained.  We will follow blood cultures until then we will continue with antibiotics.  Follow procalcitonin.  Follow lactic acid.  Since patient does have a wound on the left lower extremity CT of the left foot was ordered.  Wound team consult. 5. Chronic wound of the left foot CT foot has been ordered wound team consult. 6. Sleep apnea on BiPAP. 7. Diabetes mellitus type 2 uncontrolled check hemoglobin A1c.  Initially was started on insulin infusion which have stopped at this time we will keep patient on Levemir.  Sliding scale coverage.  Follow CBGs closely. 8. History of lymphocytic colitis on Budenoside. 9. History of psoriatic and rheumatoid arthritis on methotrexate and also infusions of immune therapy. 10. Anemia appears to be chronic follow  CBC. 11. History of gout on allopurinol.  Dose of allopurinol will need  to be decreased if creatinine does not improve. 12. History of Covid infection in December 2020.  Since patient has severe dehydration with acute renal failure with possible sepsis admitted for inpatient status.   DVT prophylaxis: Lovenox. Code Status: Full code. Family Communication: Discussed with patient. Disposition Plan: Home. Consults called: None. Admission status: Inpatient.   Rise Patience MD [facility] Pager [phone].  If 7PM-7AM, please contact night-coverage [email or web address] Password [facility]  07/12/2020, 10:52 PM

## 2020-07-12 NOTE — ED Notes (Signed)
Spoke to Dr. Ashok Cordia on regarding cbg at 189 and insulin drip/ d5. Provider said to wait until bmp comes back at  2224.

## 2020-07-12 NOTE — ED Notes (Signed)
Pt refusing for ortho to remove cast. Pt states wound care does it once a week and seeing it will not tell us anything new. Pt also refusing rectal temp. MD made aware

## 2020-07-12 NOTE — ED Notes (Signed)
Pt would like to wait until fluid is finished infusing before using restroom to provide UA

## 2020-07-12 NOTE — ED Triage Notes (Signed)
Pt reports no appetite since the day after thanksgiving with generalized weakness and dizziness. Pt reports nausea with dry heaving but no vomiting. Pt alert, nad noted

## 2020-07-12 NOTE — ED Notes (Signed)
Patient is being discharged from the Urgent Care and sent to the Emergency Department via POV and spouse . Per Curly [place], PA, patient is in need of higher level of care due to abnormal vital signs and possible dehydration. Patient is aware and verbalizes understanding of plan of care.  Vitals:   07/12/20 1046  BP: (!) 98/52  Pulse: (!) 106  Resp: 20  Temp: 98.2 F (36.8 C)  SpO2: 99%

## 2020-07-12 NOTE — ED Provider Notes (Addendum)
[facility] EMERGENCY DEPARTMENT Provider Note   CSN: [id number] Arrival date & time: 07/12/20  1112     History Chief Complaint  Patient presents with  . Weakness    [name] [name] is a 74 y.o. female.  Patient c/o general weakness in the past week. States the day after Thanksgiving she developed acute onset generally feeling weak, nauseated, and decreased po intake. Symptoms moderate-severe, constant, persistent, worsening, with very little po intake since then. Notes decreased amount of urine output. No dysuria or urgency. No abd or flank pain. No vomiting, occasional dry heaves. No abd distension. Is having normal bms. No fever/chills/sweats. Pt went to urgent care and was sent to ED due to concern for possible dehydration.   The history is provided by the patient.       Past Medical History:  Diagnosis Date  . Arthritis   . Closed fracture of fifth metatarsal bone 05/13/2018  . Deep vein thrombosis ([place])    right calf - 05/2012   . Diabetes mellitus without complication (HCC)    diet controlled   . GERD (gastroesophageal reflux disease)   . Hyperlipidemia   . Hypertension    Ejection fraction =>55% Left ventricular systolic function is normal. Left ventricular wall motion is normal    . Lymphocytic colitis   . Neuropathy    diabetic - in bilateral feet   . Pityriasis lichenoides chronica   . Sleep apnea    bipap    Patient Active Problem List   Diagnosis Date Noted  . Fatigue 11/13/2019  . Dyslipidemia 11/12/2019  . Pityriasis lichenoides chronica 09/18/2019  . Nausea 09/18/2019  . Insomnia 08/07/2019  . Anxiety 08/07/2019  . Psoriatic arthritis ([place]) 08/07/2019  . H/O: gout 08/07/2019  . Recurrent UTI 08/07/2019  . Charcot's joint of left foot 08/07/2019  . Depression 08/07/2019  . Pneumonia due to COVID-19 virus 07/09/2019  . Physical deconditioning 07/08/2019  . COVID-19 07/07/2019  . Diabetic peripheral neuropathy ([place]) 04/10/2019    . Pain in left knee 10/13/2018  . Ischemic colitis ([place]) 10/06/2018  . Arthritis 07/21/2018  . Urinary incontinence 07/21/2018  . Diabetic foot ulcer ([place]) 07/13/2018  . Cellulitis of toe of left foot 05/02/2018  . DM2 (diabetes mellitus, type 2) ([place]) 05/02/2018  . Psoriasis 05/02/2018  . Osteoarthritis of knee 10/18/2017  . Chest pain 01/12/2015  . Right bundle branch block 05/20/2014  . OSA (obstructive sleep apnea), BiPAP 03/12/2013  . Essential hypertension 03/12/2013  . Obesity 09/14/2012  . Hyponatremia 09/14/2012  . S/P right THA, AA 09/13/2012  . Deep venous thrombosis of lower extremity ([place]) 08/10/2010  . 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 DILATION AND CURETTAGE OF UTERUS    . EYE SURGERY  07/2020  . HAMMER TOE SURGERY    . right hand surgery      due to blood infection   . torn meniscus repair      right knee   . TOTAL HIP ARTHROPLASTY  09/13/2012   Procedure: TOTAL HIP ARTHROPLASTY ANTERIOR APPROACH;  Surgeon: Mauri Pole, MD;  Location: [facility];  Service: Orthopedics;  Laterality: Right;     OB History   No obstetric history on file.     Family History  Problem Relation Age of Onset  . Hypertension Mother     Social History   Tobacco Use  . Smoking status: Never Smoker  . Smokeless tobacco: Never Used  Substance Use Topics  . Alcohol use: Yes    Comment: occasional wine   . Drug use: No    Home Medications Prior to Admission medications   Medication Sig Start Date End Date Taking? Authorizing Provider  acetaminophen (TYLENOL) 325 MG tablet Take 2 tablets (650 mg total) by mouth every 6 (six) hours as needed for mild pain or headache. 07/13/19   Elgergawy, Silver Huguenin, MD  allopurinol (ZYLOPRIM) 100 MG tablet Take 1 tablet (100 mg total) by mouth 2 (two) times daily. 04/21/20   Binnie Rail, MD  ALPRAZolam Duanne Moron) 0.25 MG tablet Take 1 tablet (0.25 mg total) by mouth 2 (two) times  daily as needed for anxiety. 11/13/19   Binnie Rail, MD  aspirin 81 MG tablet Take 81 mg by mouth every other day.     [provider]  azelastine (ASTELIN) 0.1 % nasal spray Place 2 sprays into both nostrils 2 (two) times daily. Use in each nostril as directed 12/26/19   Binnie Rail, MD  Budesonide (UCERIS) 9 MG TB24 Take 9 mg by mouth daily.     [provider]  cephALEXin (KEFLEX) 500 MG capsule Take 1 capsule (500 mg total) by mouth 2 (two) times daily. 06/08/20   Binnie Rail, MD  Certolizumab Pegol (CIMZIA [place]) Inject 400 mg into the skin every 30 (thirty) days. 200 mg on each side of stomach    [provider]  Cholecalciferol (VITAMIN D3) 1000 UNITS CAPS Take 1,000 Units by mouth 2 (two) times daily.     [provider]  ciprofloxacin (CIPRO) 250 MG tablet Take 1 tablet (250 mg total) by mouth 2 (two) times daily. 06/23/20   Binnie Rail, MD  citalopram (CELEXA) 40 MG tablet TAKE 1 TABLET BY MOUTH EVERY DAY 04/22/20   Binnie Rail, MD  Cyanocobalamin (VITAMIN B-12) 1000 MCG SUBL Place 1,000 mcg under the tongue daily.    [provider]  cyclobenzaprine (FLEXERIL) 10 MG tablet TAKE 1 TABLET BY MOUTH AT BEDTIME 06/17/20   Burns, Claudina Lick, MD  docusate sodium (COLACE) 100 MG capsule Take 1 capsule (100 mg total) by mouth 2 (two) times daily. 08/30/19   Binnie Rail, MD  folic acid (FOLVITE) 1 MG tablet TAKE 1 TABLET(1 MG) BY MOUTH DAILY 04/22/20   Binnie Rail, MD  Insulin Pen Needle (B-D UF III MINI PEN NEEDLES) 31G X 5 MM MISC Use daily for insulin pen 01/23/20   Binnie Rail, MD  LEVEMIR FLEXTOUCH 100 UNIT/ML FlexPen ADMINISTER 20 UNITS UNDER THE SKIN TWICE DAILY 04/05/20   Binnie Rail, MD  lisinopril-hydrochlorothiazide (ZESTORETIC) 20-25 MG tablet Take 1 tablet by mouth daily. 05/02/20   Binnie Rail, MD  loperamide (IMODIUM) 2 MG capsule Take 6 mg by mouth 2 (two) times daily.    [provider]  Melatonin 5 MG SUBL Place 5  mg under the tongue at bedtime.    [provider]  metFORMIN (GLUCOPHAGE-XR) 500 MG 24 hr tablet Take 2 tablets (1,000 mg total) by mouth 2 (two) times daily before a meal. 06/19/20   Burns, Claudina Lick, MD  omeprazole (PRILOSEC) 40 MG capsule Take 40 mg by mouth at bedtime.    [provider]  potassium chloride (KLOR-CON) 10 MEQ tablet TAKE 2 TABLETS(20 MEQ) BY MOUTH DAILY 04/22/20   Binnie Rail, MD  Probiotic Product (ALIGN PO) Take 1 capsule by mouth daily.    [provider]  rosuvastatin (CRESTOR) 5 MG tablet Take 1 tablet (5 mg total) by mouth daily. 04/05/20   Binnie Rail, MD  triamcinolone cream (KENALOG) 0.1 % Apply 1 application topically 2 (two) times daily. 09/18/19   Binnie Rail, MD    Allergies    Other, Bee venom, Farxiga [dapagliflozin], and Sulfa antibiotics  Review of Systems   Review of Systems  Constitutional: Negative for chills and fever.  HENT: Negative for sore throat.   Eyes: Negative for redness.  Respiratory: Negative for cough and shortness of breath.   Cardiovascular: Negative for chest pain.  Gastrointestinal: Positive for nausea. Negative for abdominal pain.  Endocrine: Negative for polyuria.  Genitourinary: Negative for dysuria and flank pain.  Musculoskeletal: Negative for back pain and neck pain.  Skin: Negative for rash.  Neurological: Negative for headaches.  Hematological: Does not bruise/bleed easily.  Psychiatric/Behavioral: Negative for confusion.    Physical Exam Updated Vital Signs BP (!) 100/41 (BP Location: Right Arm)   Pulse 94   Temp 98 F (36.7 C) (Oral)   Resp 17   Ht 1.676 m ($Remove'5\' 6"'kBHqbCS$ )   Wt 112.9 kg   SpO2 98%   BMI 40.19 kg/m   Physical Exam Vitals and nursing note reviewed.  Constitutional:      Appearance: Normal appearance. She is well-developed.  HENT:     Head: Atraumatic.     Nose: Nose normal.     Mouth/Throat:     Comments: Dry mucous membranes.  Eyes:     General: No scleral  icterus.    Conjunctiva/sclera: Conjunctivae normal.     Pupils: Pupils are equal, round, and reactive to light.  Neck:     Trachea: No tracheal deviation.     Comments: No stiffness or rigidity.  Cardiovascular:     Rate and Rhythm: Normal rate and regular rhythm.     Pulses: Normal pulses.     Heart sounds: Normal heart sounds. No murmur heard.  No friction rub. No gallop.   Pulmonary:     Effort: Pulmonary effort is normal. No respiratory distress.     Breath sounds: Normal breath sounds.  Abdominal:     General: Bowel sounds are normal. There is no distension.     Palpations: Abdomen is soft. There is no mass.     Tenderness: There is abdominal tenderness. There is no guarding or rebound.     Hernia: No hernia is present.     Comments: Mild LLQ tenderness. No peritoneal signs.   Genitourinary:    Comments: No cva tenderness.  Musculoskeletal:     Cervical back: Normal range of motion and neck supple. No rigidity. No muscular tenderness.     Comments: +right ankle/lower leg edema, mild. Left lower leg/foot in cast - no sign of infection proximally, no cellulitis or lymphangitis, on malodor from area (pt not permitting cast removal).  Skin:    General: Skin is warm and dry.     Findings: No rash.  Neurological:     Mental Status: She is alert.     Comments: Alert, speech normal. Motor/sens grossly intact bil.   Psychiatric:        Mood and Affect: Mood normal.      ED Results / Procedures / Treatments   Labs (all labs ordered are listed, but only abnormal results are displayed) Results for orders placed or performed during the hospital encounter of 07/12/20  Resp Panel by RT-PCR (Flu A&B, Covid) Nasopharyngeal Swab   Specimen: Nasopharyngeal  Swab; Nasopharyngeal(NP) swabs in vial transport medium  Result Value Ref Range   SARS Coronavirus 2 by RT PCR NEGATIVE NEGATIVE   Influenza A by PCR NEGATIVE NEGATIVE   Influenza B by PCR NEGATIVE NEGATIVE  Basic metabolic panel    Result Value Ref Range   Sodium 129 (L) 135 - 145 mmol/L   Potassium 4.5 3.5 - 5.1 mmol/L   Chloride 88 (L) 98 - 111 mmol/L   CO2 20 (L) 22 - 32 mmol/L   Glucose, Bld 249 (H) 70 - 99 mg/dL   BUN 26 (H) 8 - 23 mg/dL   Creatinine, Ser 3.21 (H) 0.44 - 1.00 mg/dL   Calcium 9.4 8.9 - 10.3 mg/dL   GFR, Estimated 15 (L) >60 mL/min   Anion gap 21 (H) 5 - 15  CBC  Result Value Ref Range   WBC 17.1 (H) 4.0 - 10.5 K/uL   RBC 4.10 3.87 - 5.11 MIL/uL   Hemoglobin 11.6 (L) 12.0 - 15.0 g/dL   HCT 38.1 36 - 46 %   MCV 92.9 80.0 - 100.0 fL   MCH 28.3 26.0 - 34.0 pg   MCHC 30.4 30.0 - 36.0 g/dL   RDW 17.9 (H) 11.5 - 15.5 %   Platelets 319 150 - 400 K/uL   nRBC 0.0 0.0 - 0.2 %  Beta-hydroxybutyric acid  Result Value Ref Range   Beta-Hydroxybutyric Acid 0.80 (H) 0.05 - 0.27 mmol/L  Lactic acid, plasma  Result Value Ref Range   Lactic Acid, Venous 5.8 (HH) 0.5 - 1.9 mmol/L  Basic metabolic panel  Result Value Ref Range   Sodium 126 (L) 135 - 145 mmol/L   Potassium 4.6 3.5 - 5.1 mmol/L   Chloride 87 (L) 98 - 111 mmol/L   CO2 20 (L) 22 - 32 mmol/L   Glucose, Bld 230 (H) 70 - 99 mg/dL   BUN 29 (H) 8 - 23 mg/dL   Creatinine, Ser 3.67 (H) 0.44 - 1.00 mg/dL   Calcium 9.2 8.9 - 10.3 mg/dL   GFR, Estimated 12 (L) >60 mL/min   Anion gap 19 (H) 5 - 15  Lactic acid, plasma  Result Value Ref Range   Lactic Acid, Venous 4.5 (HH) 0.5 - 1.9 mmol/L  CBG monitoring, ED  Result Value Ref Range   Glucose-Capillary 189 (H) 70 - 99 mg/dL   Comment 1 Notify RN    DG Chest Port 1 View  Result Date: 07/12/2020 CLINICAL DATA:  Weakness. EXAM: PORTABLE CHEST 1 VIEW COMPARISON:  None. FINDINGS: The heart size and mediastinal contours are within normal limits. Both lungs are clear. The visualized skeletal structures are unremarkable. IMPRESSION: No active disease. Electronically Signed   By: Dorise Bullion III M.D   On: 07/12/2020 17:38    EKG EKG Interpretation  Date/Time:  Friday July 12 2020  11:28:01 EST Ventricular Rate:  105 PR Interval:  148 QRS Duration: 118 QT Interval:  384 QTC Calculation: 507 R Axis:   -5 Text Interpretation: Sinus tachycardia Right bundle branch block Non-specific ST-t changes Confirmed by Lajean Saver [phone]) on 07/12/2020 4:40:19 PM   Radiology CT Abdomen Pelvis Wo Contrast  Result Date: 07/12/2020 CLINICAL DATA:  Abdominal distension, anorexia EXAM: CT ABDOMEN AND PELVIS WITHOUT CONTRAST TECHNIQUE: Multidetector CT imaging of the abdomen and pelvis was performed following the standard protocol without IV contrast. COMPARISON:  11/02/2018 FINDINGS: Lower chest: No acute pleural or parenchymal lung disease. Hepatobiliary: No focal liver abnormality is seen. No gallstones, gallbladder wall thickening, or  biliary dilatation. Pancreas: Unremarkable. No pancreatic ductal dilatation or surrounding inflammatory changes. Spleen: Normal in size without focal abnormality. Adrenals/Urinary Tract: No urinary tract calculi or obstructive uropathy. The bladder is unremarkable. The adrenals are normal. Stomach/Bowel: No bowel obstruction or ileus. Diverticulosis of the colon with no evidence of acute diverticulitis. Normal appendix right lower quadrant. No bowel wall thickening or inflammatory change. Vascular/Lymphatic: Aortic atherosclerosis. No enlarged abdominal or pelvic lymph nodes. Reproductive: Uterus and bilateral adnexa are unremarkable. Other: No free fluid or free gas.  No abdominal wall hernia. Musculoskeletal: Postsurgical changes from right hip arthroplasty. No acute or destructive bony lesions. Reconstructed images demonstrate no additional findings. IMPRESSION: 1. Diverticulosis without diverticulitis. 2. No acute intra-abdominal or intrapelvic process. 3.  Aortic Atherosclerosis (ICD10-I70.0). Electronically Signed   By: Randa Ngo M.D.   On: 07/12/2020 20:48   DG Chest Port 1 View  Result Date: 07/12/2020 CLINICAL DATA:  Weakness. EXAM: PORTABLE CHEST  1 VIEW COMPARISON:  None. FINDINGS: The heart size and mediastinal contours are within normal limits. Both lungs are clear. The visualized skeletal structures are unremarkable. IMPRESSION: No active disease. Electronically Signed   By: Dorise Bullion III M.D   On: 07/12/2020 17:38    Procedures Procedures (including critical care time)  Medications Ordered in ED Medications  sodium chloride 0.9 % bolus 1,000 mL (has no administration in time range)  lactated ringers bolus 1,000 mL (has no administration in time range)  ondansetron (ZOFRAN) injection 4 mg (has no administration in time range)    ED Course  I have reviewed the triage vital signs and the nursing notes.  Pertinent labs & imaging results that were available during my care of the patient were reviewed by me and considered in my medical decision making (see chart for details).    MDM Rules/Calculators/A&P                         Iv ns. Continuous pulse ox and cardiac monitoring. Stat labs.  Iv ns bolus. zofran iv.   Reviewed nursing notes and prior charts for additional history.   Initial labs reviewed/interpreted by me - AKI.   Additional ns bolus.   CXR reviewed/interpreted by me - no pna.   Patient refuses to have left foot/lower leg cast removed. She indicates is on due to 'arthritis' in foot. She does indicate there is also a small wound to foot. I discussed need to remove cast to check on possible wound/foot infection/sepsis as cause of symptoms, and reassured her that cast could be easily re-applied if no infection - pt refuses to have cast removed. No indication cellulitis or other sign of infection proximal to cast.   MDM Number of Diagnoses or Management Options   Amount and/or Complexity of Data Reviewed Clinical lab tests: ordered and reviewed Tests in the radiology section of CPT: reviewed and ordered Tests in the medicine section of CPT: ordered and reviewed Discussion of test results with the  performing providers: yes Decide to obtain previous medical records or to obtain history from someone other than the patient: yes Obtain history from someone other than the patient: yes Review and summarize past medical records: yes Discuss the patient with other providers: yes Independent visualization of images, tracings, or specimens: yes  Risk of Complications, Morbidity, and/or Mortality Presenting problems: high Diagnostic procedures: high Management options: high   Additional labs reviewed/interpreted by me - wbc elevated.  Cxs sent. Lactate pending. UA pending.   Lactate  is very high. Pt is afebrile, denies fever/chills/sweats or source of infection. Suspect dehydration/AKI and not sepsis. Cultures have been sent. Will cover with dose iv abx, esp as pt not permitted visualization of left foot/left foot wound.  Patients ideal body wt is 60 kg, and adjusted body weight is 80 kg - pt given fluid bolus in excess of 30 cc/kg based on 80 kg adjusted wt.  Repeat lactate pending.   Additional labs reviewed - serum ketones +. Anion gap is high. ?mild dka vs dehydration/AKI. Hyperglycemic crisis order set.   Hospitalist consulted for admission.  CRITICAL CARE RE: increased anion gap met acidosis, severe dehydration, AKI, soft blood pressure, elevated lactate.  Performed by: Mirna Mires Total critical care time: 45 minutes Critical care time was exclusive of separately billable procedures and treating other patients. Critical care was necessary to treat or prevent imminent or life-threatening deterioration. Critical care was time spent personally by me on the following activities: development of treatment plan with patient and/or surrogate as well as nursing, discussions with consultants, evaluation of patient's response to treatment, examination of patient, obtaining history from patient or surrogate, ordering and performing treatments and interventions, ordering and review of laboratory  studies, ordering and review of radiographic studies, pulse oximetry and re-evaluation of patient's condition.  CT reviewed/interpreted by me - no acute abd process.      Final Clinical Impression(s) / ED Diagnoses Final diagnoses:  None    Rx / DC Orders ED Discharge Orders    None         Lajean Saver, MD 07/12/20 2057

## 2020-07-13 ENCOUNTER — Encounter (HOSPITAL_COMMUNITY): Payer: Self-pay | Admitting: Student

## 2020-07-13 ENCOUNTER — Inpatient Hospital Stay (HOSPITAL_COMMUNITY): Payer: Medicare Other

## 2020-07-13 DIAGNOSIS — R579 Shock, unspecified: Secondary | ICD-10-CM | POA: Diagnosis present

## 2020-07-13 DIAGNOSIS — N179 Acute kidney failure, unspecified: Secondary | ICD-10-CM | POA: Diagnosis not present

## 2020-07-13 LAB — CBG MONITORING, ED
Glucose-Capillary: 166 mg/dL — ABNORMAL HIGH (ref 70–99)
Glucose-Capillary: 163 mg/dL — ABNORMAL HIGH (ref 70–99)

## 2020-07-13 LAB — CBC
HCT: 28.5 % — ABNORMAL LOW (ref 36.0–46.0)
Hemoglobin: 9.7 g/dL — ABNORMAL LOW (ref 12.0–15.0)
MCH: 29.7 pg (ref 26.0–34.0)
MCHC: 34 g/dL (ref 30.0–36.0)
MCV: 87.2 fL (ref 80.0–100.0)
Platelets: 213 10*3/uL (ref 150–400)
RBC: 3.27 MIL/uL — ABNORMAL LOW (ref 3.87–5.11)
RDW: 17.5 % — ABNORMAL HIGH (ref 11.5–15.5)
WBC: 11.5 10*3/uL — ABNORMAL HIGH (ref 4.0–10.5)
nRBC: 0 % (ref 0.0–0.2)

## 2020-07-13 LAB — HEPATIC FUNCTION PANEL
ALT: 40 U/L (ref 0–44)
AST: 37 U/L (ref 15–41)
Albumin: 2.3 g/dL — ABNORMAL LOW (ref 3.5–5.0)
Alkaline Phosphatase: 42 U/L (ref 38–126)
Bilirubin, Direct: 0.2 mg/dL (ref 0.0–0.2)
Indirect Bilirubin: 0.6 mg/dL (ref 0.3–0.9)
Total Bilirubin: 0.8 mg/dL (ref 0.3–1.2)
Total Protein: 4.7 g/dL — ABNORMAL LOW (ref 6.5–8.1)

## 2020-07-13 LAB — HEMOGLOBIN A1C
Hgb A1c MFr Bld: 7.9 % — ABNORMAL HIGH (ref 4.8–5.6)
Mean Plasma Glucose: 180.03 mg/dL

## 2020-07-13 LAB — CBC WITH DIFFERENTIAL/PLATELET
Abs Immature Granulocytes: 0.15 10*3/uL — ABNORMAL HIGH (ref 0.00–0.07)
Basophils Absolute: 0 10*3/uL (ref 0.0–0.1)
Basophils Relative: 0 %
Eosinophils Absolute: 0.2 10*3/uL (ref 0.0–0.5)
Eosinophils Relative: 2 %
HCT: 30.6 % — ABNORMAL LOW (ref 36.0–46.0)
Hemoglobin: 9.7 g/dL — ABNORMAL LOW (ref 12.0–15.0)
Immature Granulocytes: 1 %
Lymphocytes Relative: 16 %
Lymphs Abs: 2 10*3/uL (ref 0.7–4.0)
MCH: 28.8 pg (ref 26.0–34.0)
MCHC: 31.7 g/dL (ref 30.0–36.0)
MCV: 90.8 fL (ref 80.0–100.0)
Monocytes Absolute: 0.8 10*3/uL (ref 0.1–1.0)
Monocytes Relative: 6 %
Neutro Abs: 9.2 10*3/uL — ABNORMAL HIGH (ref 1.7–7.7)
Neutrophils Relative %: 75 %
Platelets: 236 10*3/uL (ref 150–400)
RBC: 3.37 MIL/uL — ABNORMAL LOW (ref 3.87–5.11)
RDW: 17.8 % — ABNORMAL HIGH (ref 11.5–15.5)
WBC: 12.4 10*3/uL — ABNORMAL HIGH (ref 4.0–10.5)
nRBC: 0 % (ref 0.0–0.2)

## 2020-07-13 LAB — MRSA PCR SCREENING: MRSA by PCR: NEGATIVE

## 2020-07-13 LAB — BASIC METABOLIC PANEL
Anion gap: 12 (ref 5–15)
BUN: 27 mg/dL — ABNORMAL HIGH (ref 8–23)
CO2: 23 mmol/L (ref 22–32)
Calcium: 8.4 mg/dL — ABNORMAL LOW (ref 8.9–10.3)
Chloride: 93 mmol/L — ABNORMAL LOW (ref 98–111)
Creatinine, Ser: 3.13 mg/dL — ABNORMAL HIGH (ref 0.44–1.00)
GFR, Estimated: 15 mL/min — ABNORMAL LOW (ref >60)
Glucose, Bld: 174 mg/dL — ABNORMAL HIGH (ref 70–99)
Potassium: 4.5 mmol/L (ref 3.5–5.1)
Sodium: 128 mmol/L — ABNORMAL LOW (ref 135–145)

## 2020-07-13 LAB — PROCALCITONIN: Procalcitonin: 0.33 ng/mL

## 2020-07-13 LAB — LACTIC ACID, PLASMA
Lactic Acid, Venous: 2.9 mmol/L (ref 0.5–1.9)
Lactic Acid, Venous: 2.6 mmol/L (ref 0.5–1.9)

## 2020-07-13 LAB — CORTISOL: Cortisol, Plasma: 6.7 ug/dL

## 2020-07-13 LAB — GLUCOSE, CAPILLARY
Glucose-Capillary: 394 mg/dL — ABNORMAL HIGH (ref 70–99)
Glucose-Capillary: 414 mg/dL — ABNORMAL HIGH (ref 70–99)
Glucose-Capillary: 479 mg/dL — ABNORMAL HIGH (ref 70–99)

## 2020-07-13 LAB — CREATININE, SERUM
Creatinine, Ser: 3.31 mg/dL — ABNORMAL HIGH (ref 0.44–1.00)
GFR, Estimated: 14 mL/min — ABNORMAL LOW (ref >60)

## 2020-07-13 LAB — SODIUM, URINE, RANDOM: Sodium, Ur: 68 mmol/L

## 2020-07-13 LAB — CREATININE, URINE, RANDOM: Creatinine, Urine: 22.44 mg/dL

## 2020-07-13 LAB — LIPASE, BLOOD: Lipase: 19 U/L (ref 11–51)

## 2020-07-13 MED ORDER — MUPIROCIN 2 % EX OINT
1.0000 "application " | TOPICAL_OINTMENT | Freq: Two times a day (BID) | CUTANEOUS | Status: DC
  Administered 2020-07-13 – 2020-07-17 (×9): 1 via NASAL
  Filled 2020-07-13 (×3): qty 22

## 2020-07-13 MED ORDER — INSULIN ASPART 100 UNIT/ML ~~LOC~~ SOLN
5.0000 [IU] | Freq: Once | SUBCUTANEOUS | Status: AC
  Administered 2020-07-13: 5 [IU] via SUBCUTANEOUS

## 2020-07-13 MED ORDER — NOREPINEPHRINE 4 MG/250ML-% IV SOLN
0.0000 ug/min | INTRAVENOUS | Status: DC
  Administered 2020-07-13: 2 ug/min via INTRAVENOUS
  Filled 2020-07-13: qty 250

## 2020-07-13 MED ORDER — LACTATED RINGERS IV BOLUS
2000.0000 mL | Freq: Once | INTRAVENOUS | Status: AC
  Administered 2020-07-13: 2000 mL via INTRAVENOUS

## 2020-07-13 MED ORDER — HYDROCORTISONE NA SUCCINATE PF 100 MG IJ SOLR
50.0000 mg | Freq: Four times a day (QID) | INTRAMUSCULAR | Status: DC
  Administered 2020-07-13 – 2020-07-14 (×6): 50 mg via INTRAVENOUS
  Filled 2020-07-13 (×6): qty 2

## 2020-07-13 MED ORDER — HYDROCORTISONE NA SUCCINATE PF 100 MG IJ SOLR
50.0000 mg | Freq: Three times a day (TID) | INTRAMUSCULAR | Status: DC
  Administered 2020-07-13: 50 mg via INTRAVENOUS
  Filled 2020-07-13: qty 2

## 2020-07-13 MED ORDER — DOCUSATE SODIUM 100 MG PO CAPS: 100.0000 mg | ORAL_CAPSULE | Freq: Two times a day (BID) | ORAL | Status: DC | PRN

## 2020-07-13 MED ORDER — HYDRALAZINE HCL 20 MG/ML IJ SOLN: 10.0000 mg | INTRAMUSCULAR | Status: DC | PRN

## 2020-07-13 MED ORDER — CHLORHEXIDINE GLUCONATE CLOTH 2 % EX PADS
6.0000 | MEDICATED_PAD | Freq: Every day | CUTANEOUS | Status: DC
  Administered 2020-07-13 – 2020-07-17 (×5): 6 via TOPICAL

## 2020-07-13 MED ORDER — INSULIN DETEMIR 100 UNIT/ML ~~LOC~~ SOLN
10.0000 [IU] | Freq: Every day | SUBCUTANEOUS | Status: DC
  Administered 2020-07-13: 10 [IU] via SUBCUTANEOUS
  Filled 2020-07-13 (×2): qty 0.1

## 2020-07-13 MED ORDER — POLYETHYLENE GLYCOL 3350 17 G PO PACK: 17.0000 g | PACK | Freq: Every day | ORAL | Status: DC | PRN

## 2020-07-13 NOTE — Progress Notes (Signed)
Patient rec'd from ED, report rec'd. All VSS, BP 130's/70's, Levo d/c'd approx. 0700, no episodes of hypotension since levo off. Patient alert and oriented, voiding normally. Moving all extremities, left lower leg with boot and dsg on from wound care clinic. No c/o pain. Sinus rhythm on cardiac monitor.Fungal appearing rash in skin folds-nystatin powder applied. Small blisters noted on right side of lower buttocks-appears to be MASD. Purwick in place and functioning. Patient has eaten, tolerated fine with no N/V.

## 2020-07-13 NOTE — Plan of Care (Signed)

## 2020-07-13 NOTE — Progress Notes (Signed)
CCM will take over patient 's care. Patient on IV pressors, admitted to ICU.

## 2020-07-13 NOTE — Progress Notes (Signed)
Patient stated that she wears BIPAP at night but she is currently nauseous. Advised patient and RN that patient cannot be placed on BIPAP due to nausea.

## 2020-07-13 NOTE — ED Notes (Signed)
Breakfast Ordered 

## 2020-07-13 NOTE — Progress Notes (Addendum)
  Addendum 07/13/20 1315  -patient has remained normotensive off of NE for several hours after receiving additional IVF for hypovolemic shock -hemodynamically stable at this time, with stable respiratory status on room air -stable for transfer out of ICU P -transfer to progressive -will ask TRH to resume care 12/5 with PCCM off ___________________________________ PCCM Brief Progress Note  74 yo F PMH psoriatic arthritis on mtx, lymphocytic colitis on budenoside admitted for hypotension in setting of persistent nausea and poor PO intake x 1 week.  Admitted initially to [facility], but after persistent hypotension following 4.5L IVF, was started on peripheral pressors and PCCM consulted.  This morning, pt NE is at 30mcg, with SBP 110-148. HR 89. SpO2 100% on RA. RR 13, T 36.7   Gen- chronically ill appearing F, obese, reclined in NAD HEENT: red, dry, scalloped tongue. Dry mm. Crackled lips. Anicteric sclera Pulm- diminished breath sounds without adventitious sounds, symmetrical expansion CV rrr s1s2 cap refill < 3 sec GI- obese soft round hypoactive GU defer Ext. LLE cast. No other obvious joint deformities Skin: scattered ecchymosis and petechiae  Neuro: AAOx3. Some delayed word finding    Hypovolemic shock Possible sepsis/ septic shock in immunocompromised host-- in review of wound care notes, very low suspicion for LLE infection. BCx Pending. Pt reports recent UTI P -additional 2L LR -goal MAP > 65, SBP > 90 -goal to successfully wean off peripheral pressors, no indication for CVC placement at present -Send UCx -continue broad abx     I spoke with the patient's daughter, Dr. Tarri Glenn, and provided updates at request of patient. All questions answered.     Additional critical care time: 35 min  CRITICAL CARE Performed by: Cristal Generous   Total critical care time: 35 minutes  Critical care time was exclusive of separately billable procedures and treating other  patients. Critical care was necessary to treat or prevent imminent or life-threatening deterioration.  Critical care was time spent personally by me on the following activities: development of treatment plan with patient and/or surrogate as well as nursing, discussions with consultants, evaluation of patient's response to treatment, examination of patient, obtaining history from patient or surrogate, ordering and performing treatments and interventions, ordering and review of laboratory studies, ordering and review of radiographic studies, pulse oximetry and re-evaluation of patient's condition.   Eliseo Gum MSN, AGACNP-BC [place] [phone] If no answer, [phone] 07/13/2020, 9:10 AM

## 2020-07-13 NOTE — ED Notes (Signed)
Pt ambulatory to restroom with walker and stand by assist

## 2020-07-13 NOTE — Consult Note (Signed)
NAME:  [name] [name], MRN:  [id number], DOB:  Nov 19, 1945, LOS: 1 ADMISSION DATE:  07/12/2020, CONSULTATION DATE:  07/13/20 REFERRING MD:  Hal Hope, CHIEF COMPLAINT:  Nausea  Brief History   31yF with h/o DM2, hypertension, lymphocytic colitis, psoriatic and rheumatoid arthritis here with possible sepsis unclear source, persistent hypotension, constipation  History of present illness   39yF with h/o DM2, hypertension, lymphocytic colitis, psoriatic and rheumatoid arthritis who was in her USOH until shortly after thanksgiving developed worsening nausea, lower abdominal pain. She hasn't been able to eat very much at all in the last week. Some constipation, no diarrhea, hematochezia, melena. Very limited amount of emesis.   In ED she was given 4.5L+ of IV crystalloid fluids, briefly started on insulin gtt with concern for DKA, started on vanc/cefepime/flagyl  Past Medical History  DM2 DM foot wound HTN Lymphocytic colitis Psoriatic, Rheumatoid Arthritis DVT  Significant Hospital Events   12/4 admitted  Consults:  PCCM  Procedures:  None  Significant Diagnostic Tests:  CT A/P 12/3: diverticulosis  Micro Data:  None   Antimicrobials:  Vancomycin 12/4- Cefepime 12/4-   Interim history/subjective:  n/a  Objective   Blood pressure (!) 115/46, pulse (!) 101, temperature 98 F (36.7 C), temperature source Oral, resp. rate 15, height 5\' 6"  (1.676 m), weight 112.9 kg, SpO2 98 %.       No intake or output data in the 24 hours ending 07/13/20 0615 Filed Weights   07/12/20 1130  Weight: 112.9 kg    Examination: General: alert/oriented x3 HENT: NCAT, dry MM Lungs: CTAB, normal work of breathing Cardiovascular: tachycardic, RR, no murmur, no JVD  Abdomen: soft, nontender, normal bowel sounds Extremities: warm, well-perfused without cyanosis, edema Neuro: moves all extremities, follows commands  Bedside US: LV systolic function appears normal to hyperdynamic, RV/LV ratio  WNL, difficult to appreciate IVC  Resolved Hospital Problem list   n/a  Assessment & Plan:   # Hypotension: May be multifactorial in setting hypovolemia with poor po intake, possible sepsis (?GI vs SSTI (LLE) source) with lingering effect of antihypertensives, possible component of adrenal insufficiency given steroid use as outpatient. Seems distributive predominantly given warmth of extremities. Hypovolemia addressed with 4.5L+ administered so far however her volume exam is challenging. - stress dose steroids - wean levo for MAP 65 - cultures, narrow ABX as able - primary team has planned to order CT LLE to evaluate LLE wound  # Nausea: significant stool burden, could be result of constipation vs sepsis. - check lipase - bowel regimen  # AKI:  - f/u response to crystalloid administration  # DM2: - levemir/ssi  # Sleep-disordered breathing: - home BiPAP at night/naps when nausea improved      Best practice:  Diet: heart healthy/carb modified Pain/Anxiety/Delirium protocol (if indicated): no VAP protocol (if indicated): no DVT prophylaxis: chemoppx GI prophylaxis: home ppi Glucose control: basal/ssi Mobility: bed level Code Status: Full Family Communication: Patient updated at bedside Disposition: Inpatient/stepdown  Labs   CBC: Recent Labs  Lab 07/12/20 1140 07/12/20 2230 07/13/20 0511  WBC 17.1* 12.8* 12.4*  NEUTROABS  --   --  9.2*  HGB 11.6* 9.8* 9.7*  HCT 38.1 31.0* 30.6*  MCV 92.9 91.2 90.8  PLT 319 223 935    Basic Metabolic Panel: Recent Labs  Lab 07/12/20 1140 07/12/20 1730 07/12/20 1938 07/12/20 2230  NA 129* 126* 130*  --   K 4.5 4.6 4.6  --   CL 88* 87* 93*  --  CO2 20* 20* 23  --   GLUCOSE 249* 230* 172*  --   BUN 26* 29* 27*  --   CREATININE 3.21* 3.67* 3.37* 3.31*  CALCIUM 9.4 9.2 8.8*  --    GFR: Estimated Creatinine Clearance: 19 mL/min (A) (by C-G formula based on SCr of 3.31 mg/dL (H)). Recent Labs  Lab 07/12/20 1140  07/12/20 1725 07/12/20 1938 07/12/20 2226 07/12/20 2230 07/13/20 0511  PROCALCITON  --   --   --   --  0.33  --   WBC 17.1*  --   --   --  12.8* 12.4*  LATICACIDVEN  --  5.8* 4.5* 2.9*  --   --     Liver Function Tests: No results for input(s): AST, ALT, ALKPHOS, BILITOT, PROT, ALBUMIN in the last 168 hours. No results for input(s): LIPASE, AMYLASE in the last 168 hours. No results for input(s): AMMONIA in the last 168 hours.  ABG No results found for: PHART, PCO2ART, PO2ART, HCO3, TCO2, ACIDBASEDEF, O2SAT   Coagulation Profile: No results for input(s): INR, PROTIME in the last 168 hours.  Cardiac Enzymes: No results for input(s): CKTOTAL, CKMB, CKMBINDEX, TROPONINI in the last 168 hours.  HbA1C: Hemoglobin A1C  Date/Time Value Ref Range Status  04/05/2020 10:38 AM 7.6 (A) 4.0 - 5.6 % Final   Hgb A1c MFr Bld  Date/Time Value Ref Range Status  07/13/2020 05:00 AM 7.9 (H) 4.8 - 5.6 % Final    Comment:    (NOTE) Pre diabetes:          5.7%-6.4%  Diabetes:              >6.4%  Glycemic control for   <7.0% adults with diabetes   12/21/2019 11:36 AM 9.0 (H) 4.6 - 6.5 % Final    Comment:    Glycemic Control Guidelines for People with Diabetes:Non Diabetic:  <6%Goal of Therapy: <7%Additional Action Suggested:  >8%     CBG: Recent Labs  Lab 07/12/20 2105 07/12/20 2142 07/12/20 2240 07/12/20 2356 07/13/20 0343  GLUCAP 147* 175* 152* 156* 166*    Review of Systems:   A twelve point review of systems is negative except as otherwise noted in HPI  Past Medical History  She,  has a past medical history of Arthritis, Closed fracture of fifth metatarsal bone (05/13/2018), Deep vein thrombosis ([place]), Diabetes mellitus without complication ([place]), GERD (gastroesophageal reflux disease), Hyperlipidemia, Hypertension, Lymphocytic colitis, Neuropathy, Pityriasis lichenoides chronica, and Sleep apnea.   Surgical History    Past Surgical History:  Procedure Laterality Date  .  DILATION AND CURETTAGE OF UTERUS    . EYE SURGERY  07/2020  . HAMMER TOE SURGERY    . right hand surgery      due to blood infection   . torn meniscus repair      right knee   . TOTAL HIP ARTHROPLASTY  09/13/2012   Procedure: TOTAL HIP ARTHROPLASTY ANTERIOR APPROACH;  Surgeon: Mauri Pole, MD;  Location: [facility];  Service: Orthopedics;  Laterality: Right;     Social History   reports that she has never smoked. She has never used smokeless tobacco. She reports current alcohol use. She reports that she does not use drugs.   Family History   Her family history includes Hypertension in her mother.   Allergies Allergies  Allergen Reactions  . Other Shortness Of Breath    Blue fish: palms and feet turn red  . Bee Venom Swelling  . Wilder Glade [Dapagliflozin]  Recurrent yeast infections  . Sulfa Antibiotics Hives     Home Medications  Prior to Admission medications   Medication Sig Start Date End Date Taking? Authorizing Provider  acetaminophen (TYLENOL) 325 MG tablet Take 2 tablets (650 mg total) by mouth every 6 (six) hours as needed for mild pain or headache. 07/13/19  Yes Elgergawy, Silver Huguenin, MD  allopurinol (ZYLOPRIM) 100 MG tablet Take 1 tablet (100 mg total) by mouth 2 (two) times daily. 04/21/20  Yes Burns, Claudina Lick, MD  ALPRAZolam Duanne Moron) 0.25 MG tablet Take 1 tablet (0.25 mg total) by mouth 2 (two) times daily as needed for anxiety. 11/13/19  Yes Burns, Claudina Lick, MD  ascorbic acid (VITAMIN C) 500 MG tablet Take 500 mg by mouth at bedtime.   Yes [provider]  aspirin 81 MG tablet Take 81 mg by mouth See admin instructions. Every 3 days   Yes [provider]  azelastine (ASTELIN) 0.1 % nasal spray Place 2 sprays into both nostrils 2 (two) times daily. Use in each nostril as directed Patient taking differently: Place 2 sprays into both nostrils at bedtime. Use in each nostril as directed 12/26/19  Yes Burns, Claudina Lick, MD  Budesonide (UCERIS) 9 MG TB24 Take 9 mg by  mouth daily.    Yes [provider]  Certolizumab Pegol (CIMZIA [place]) Inject 400 mg into the skin every 30 (thirty) days. 200 mg on each side of stomach   Yes [provider]  Cholecalciferol (VITAMIN D3) 1000 UNITS CAPS Take 1,000 Units by mouth 2 (two) times daily.    Yes [provider]  citalopram (CELEXA) 40 MG tablet TAKE 1 TABLET BY MOUTH EVERY DAY Patient taking differently: Take 40 mg by mouth daily.  04/22/20  Yes Burns, Claudina Lick, MD  Cyanocobalamin (VITAMIN B-12) 1000 MCG SUBL Place 1,000 mcg under the tongue daily.   Yes [provider]  cyclobenzaprine (FLEXERIL) 10 MG tablet TAKE 1 TABLET BY MOUTH AT BEDTIME Patient taking differently: Take 10 mg by mouth at bedtime.  06/17/20  Yes Burns, Claudina Lick, MD  docusate sodium (COLACE) 100 MG capsule Take 1 capsule (100 mg total) by mouth 2 (two) times daily. 08/30/19  Yes Burns, Claudina Lick, MD  folic acid (FOLVITE) 1 MG tablet TAKE 1 TABLET(1 MG) BY MOUTH DAILY Patient taking differently: Take 1 mg by mouth daily.  04/22/20  Yes Burns, Claudina Lick, MD  Insulin Pen Needle (B-D UF III MINI PEN NEEDLES) 31G X 5 MM MISC Use daily for insulin pen 01/23/20  Yes Burns, Claudina Lick, MD  LEVEMIR FLEXTOUCH 100 UNIT/ML FlexPen ADMINISTER 20 UNITS UNDER THE SKIN TWICE DAILY Patient taking differently: Inject 20 Units into the skin 2 (two) times daily.  04/05/20  Yes Burns, Claudina Lick, MD  lisinopril-hydrochlorothiazide (ZESTORETIC) 20-25 MG tablet Take 1 tablet by mouth daily. 05/02/20  Yes Burns, Claudina Lick, MD  loperamide (IMODIUM) 2 MG capsule Take 6 mg by mouth 2 (two) times daily.   Yes [provider]  Melatonin 5 MG SUBL Place 5 mg under the tongue at bedtime.   Yes [provider]  metFORMIN (GLUCOPHAGE-XR) 500 MG 24 hr tablet Take 2 tablets (1,000 mg total) by mouth 2 (two) times daily before a meal. 06/19/20  Yes Burns, Claudina Lick, MD  methotrexate (RHEUMATREX) 2.5 MG tablet Take 20 mg by mouth every Friday.  05/10/20  Yes  [provider]  naftifine (NAFTIN) 1 % cream Apply 1 application topically daily as needed (  rash).  06/27/20  Yes [provider]  omeprazole (PRILOSEC) 40 MG capsule Take 40 mg by mouth at bedtime.   Yes [provider]  potassium chloride (KLOR-CON) 10 MEQ tablet TAKE 2 TABLETS(20 MEQ) BY MOUTH DAILY Patient taking differently: Take 40 mEq by mouth daily.  04/22/20  Yes Burns, Claudina Lick, MD  prednisoLONE acetate (PRED FORTE) 1 % ophthalmic suspension Place 4 drops into the right eye See admin instructions. 4 drops  daily for 1 week, 2 drops daily until post op. Surgery 07/11/20 07/11/20  Yes [provider]  Probiotic Product (ALIGN PO) Take 1 capsule by mouth daily.   Yes [provider]  rosuvastatin (CRESTOR) 5 MG tablet Take 1 tablet (5 mg total) by mouth daily. Patient taking differently: Take 5 mg by mouth at bedtime.  04/05/20  Yes Burns, Claudina Lick, MD  triamcinolone cream (KENALOG) 0.1 % Apply 1 application topically 2 (two) times daily. Patient taking differently: Apply 1 application topically 2 (two) times daily as needed (rash).  09/18/19  Yes Burns, Claudina Lick, MD  cephALEXin (KEFLEX) 500 MG capsule Take 1 capsule (500 mg total) by mouth 2 (two) times daily. Patient not taking: Reported on 07/12/2020 06/08/20   Binnie Rail, MD  ciprofloxacin (CIPRO) 250 MG tablet Take 1 tablet (250 mg total) by mouth 2 (two) times daily. Patient not taking: Reported on 07/12/2020 06/23/20   Binnie Rail, MD     Critical care time: 35 minutes spent in chart review, bedside evaluation, critical care [place] PGY-5, Pulmonary/Critical Care

## 2020-07-14 DIAGNOSIS — E0810 Diabetes mellitus due to underlying condition with ketoacidosis without coma: Secondary | ICD-10-CM

## 2020-07-14 LAB — CBC
HCT: 30.8 % — ABNORMAL LOW (ref 36.0–46.0)
Hemoglobin: 9.9 g/dL — ABNORMAL LOW (ref 12.0–15.0)
MCH: 28.7 pg (ref 26.0–34.0)
MCHC: 32.1 g/dL (ref 30.0–36.0)
MCV: 89.3 fL (ref 80.0–100.0)
Platelets: 222 10*3/uL (ref 150–400)
RBC: 3.45 MIL/uL — ABNORMAL LOW (ref 3.87–5.11)
RDW: 17.5 % — ABNORMAL HIGH (ref 11.5–15.5)
WBC: 13.8 10*3/uL — ABNORMAL HIGH (ref 4.0–10.5)
nRBC: 0 % (ref 0.0–0.2)

## 2020-07-14 LAB — BASIC METABOLIC PANEL
Anion gap: 11 (ref 5–15)
Anion gap: 13 (ref 5–15)
BUN: 26 mg/dL — ABNORMAL HIGH (ref 8–23)
BUN: 26 mg/dL — ABNORMAL HIGH (ref 8–23)
CO2: 22 mmol/L (ref 22–32)
CO2: 22 mmol/L (ref 22–32)
Calcium: 8.2 mg/dL — ABNORMAL LOW (ref 8.9–10.3)
Calcium: 8.3 mg/dL — ABNORMAL LOW (ref 8.9–10.3)
Chloride: 95 mmol/L — ABNORMAL LOW (ref 98–111)
Chloride: 97 mmol/L — ABNORMAL LOW (ref 98–111)
Creatinine, Ser: 2.64 mg/dL — ABNORMAL HIGH (ref 0.44–1.00)
Creatinine, Ser: 2.42 mg/dL — ABNORMAL HIGH (ref 0.44–1.00)
GFR, Estimated: 18 mL/min — ABNORMAL LOW (ref >60)
GFR, Estimated: 20 mL/min — ABNORMAL LOW (ref >60)
Glucose, Bld: 416 mg/dL — ABNORMAL HIGH (ref 70–99)
Glucose, Bld: 303 mg/dL — ABNORMAL HIGH (ref 70–99)
Potassium: 4.1 mmol/L (ref 3.5–5.1)
Potassium: 4.1 mmol/L (ref 3.5–5.1)
Sodium: 128 mmol/L — ABNORMAL LOW (ref 135–145)
Sodium: 132 mmol/L — ABNORMAL LOW (ref 135–145)

## 2020-07-14 LAB — GLUCOSE, CAPILLARY
Glucose-Capillary: 289 mg/dL — ABNORMAL HIGH (ref 70–99)
Glucose-Capillary: 312 mg/dL — ABNORMAL HIGH (ref 70–99)
Glucose-Capillary: 336 mg/dL — ABNORMAL HIGH (ref 70–99)
Glucose-Capillary: 357 mg/dL — ABNORMAL HIGH (ref 70–99)
Glucose-Capillary: 362 mg/dL — ABNORMAL HIGH (ref 70–99)
Glucose-Capillary: 366 mg/dL — ABNORMAL HIGH (ref 70–99)

## 2020-07-14 MED ORDER — LACTATED RINGERS IV SOLN: INTRAVENOUS | Status: DC

## 2020-07-14 MED ORDER — INSULIN ASPART 100 UNIT/ML ~~LOC~~ SOLN
6.0000 [IU] | Freq: Once | SUBCUTANEOUS | Status: AC
  Administered 2020-07-14: 6 [IU] via SUBCUTANEOUS

## 2020-07-14 MED ORDER — POLYETHYLENE GLYCOL 3350 17 G PO PACK: 17.0000 g | PACK | Freq: Every day | ORAL | Status: DC

## 2020-07-14 MED ORDER — INSULIN DETEMIR 100 UNIT/ML ~~LOC~~ SOLN
20.0000 [IU] | Freq: Every day | SUBCUTANEOUS | Status: DC
  Administered 2020-07-15: 20 [IU] via SUBCUTANEOUS
  Filled 2020-07-14: qty 0.2

## 2020-07-14 MED ORDER — INSULIN DETEMIR 100 UNIT/ML ~~LOC~~ SOLN
15.0000 [IU] | Freq: Every day | SUBCUTANEOUS | Status: DC
  Administered 2020-07-14: 15 [IU] via SUBCUTANEOUS
  Filled 2020-07-14: qty 0.15

## 2020-07-14 MED ORDER — HYDROCORTISONE NA SUCCINATE PF 100 MG IJ SOLR
50.0000 mg | Freq: Two times a day (BID) | INTRAMUSCULAR | Status: DC
  Administered 2020-07-15: 50 mg via INTRAVENOUS
  Filled 2020-07-14: qty 2

## 2020-07-14 NOTE — TOC Initial Note (Addendum)
Transition of Care [facility]) - Initial/Assessment Note    Patient Details  Name: [name] [name] MRN: [id number] Date of Birth: June 21, 1946  Transition of Care [facility]) CM/SW Contact:    Carles Collet, RN Phone Number: 07/14/2020, 11:51 AM  Clinical Narrative:           Damaris Schooner w patient at bedside to discuss [facility] consult. She is from home w her spouse. Was recently getting would care at [facility], she states it was a small wound and it is practically healed at this point. She states she has a BiPAP that she uses at night, and does not use oxygen at home, currently on RA, she has a RW and a cane. She states that she was recently to start OP PT at [facility], but would be more interested in [facility] PT at this point. We discussed Medicare ratings and she would like the highest rated [place] in her zip code. Patient will DC to [place], [place], [place] [address] PT eval pending. Will need HH orders and referral placed.          Expected Discharge Plan: [place] Barriers to Discharge: Continued Medical Work up   Patient Goals and CMS Choice Patient states their goals for this hospitalization and ongoing recovery are:: to go home CMS Medicare.gov Compare Post Acute Care list provided to:: Patient Choice offered to / list presented to : Patient  Expected Discharge Plan and Services Expected Discharge Plan: [place]   Discharge Planning Services: CM Consult Post Acute Care Choice: Home Health                                        Prior Living Arrangements/Services   Lives with:: Spouse                   Activities of Daily Living Home Assistive Devices/Equipment: Shower chair with back, Environmental consultant (specify type), BIPAP, Cane (specify quad or straight), Eyeglasses ADL Screening (condition at time of admission) Patient's cognitive ability adequate to safely complete daily activities?: Yes Is the patient deaf or have difficulty  hearing?: No Does the patient have difficulty seeing, even when wearing glasses/contacts?: No Does the patient have difficulty concentrating, remembering, or making decisions?: No Patient able to express need for assistance with ADLs?: No Does the patient have difficulty dressing or bathing?: No Independently performs ADLs?: Yes (appropriate for developmental age) Does the patient have difficulty walking or climbing stairs?: Yes Weakness of Legs: Left Weakness of Arms/Hands: None  Permission Sought/Granted                  Emotional Assessment              Admission diagnosis:  Dehydration [E86.0] Nausea [R11.0] Shock ([place]) [R57.9] ARF (acute renal failure) ([place]) [N17.9] Generalized weakness [R53.1] AKI (acute kidney injury) ([place]) [N17.9] Increased anion gap metabolic acidosis [J09.3] Diabetic ketoacidosis without coma associated with diabetes mellitus due to underlying condition ([place]) [E08.10] Patient Active Problem List   Diagnosis Date Noted  . Shock ([place]) 07/13/2020  . ARF (acute renal failure) ([place]) 07/12/2020  . Increased anion gap metabolic acidosis 26/71/2458  . Fatigue 11/13/2019  . Dyslipidemia 11/12/2019  . Pityriasis lichenoides chronica 09/18/2019  . Nausea 09/18/2019  . Insomnia 08/07/2019  . Anxiety 08/07/2019  . Psoriatic arthritis ([place]) 08/07/2019  . H/O:  gout 08/07/2019  . Recurrent UTI 08/07/2019  . Charcot's joint of left foot 08/07/2019  . Depression 08/07/2019  . Pneumonia due to COVID-19 virus 07/09/2019  . Physical deconditioning 07/08/2019  . COVID-19 07/07/2019  . Diabetic peripheral neuropathy ([place]) 04/10/2019  . Pain in left knee 10/13/2018  . Ischemic colitis ([place]) 10/06/2018  . Arthritis 07/21/2018  . Urinary incontinence 07/21/2018  . Diabetic foot ulcer ([place]) 07/13/2018  . Cellulitis of toe of left foot 05/02/2018  . DM2 (diabetes mellitus, type 2) ([place]) 05/02/2018  . Psoriasis 05/02/2018  . Osteoarthritis of knee  10/18/2017  . Chest pain 01/12/2015  . Right bundle branch block 05/20/2014  . OSA (obstructive sleep apnea), BiPAP 03/12/2013  . Essential hypertension 03/12/2013  . Obesity 09/14/2012  . Hyponatremia 09/14/2012  . S/P right THA, AA 09/13/2012  . Deep venous thrombosis of lower extremity ([place]) 08/10/2010  . Lymphocytic-plasmacytic colitis 08/11/2007   PCP:  Binnie Rail, MD Pharmacy:   [facility] [place], [address] [place] [address] Phone: [phone] Fax: [phone]  [facility] [place], [place] [address] [place] [address] Phone: [phone] Fax: [phone]     Social Determinants of Health (SDOH) Interventions    Readmission Risk Interventions No flowsheet data found.

## 2020-07-14 NOTE — Progress Notes (Signed)
Pt place on Bipap for the night. Pt tol well.

## 2020-07-14 NOTE — Progress Notes (Signed)
PROGRESS NOTE    [name] [name]  XQJ:[id number] DOB: 01-06-1946 DOA: 07/12/2020 PCP: Binnie Rail, MD    Brief Narrative:  74 y.o. female with history of diabetes mellitus type 2, hypertension, lymphocytic colitis, psoriatic and rheumatoid arthritis on immunosuppressants, sleep apnea previous history of DVT presents to the ER after patient was feeling weak and dizzy with persistent nausea with some vomiting unable to eat well since Thanksgiving almost a week now.  Denies any fever chills.  Denies chest pain or shortness of breath.  Denies diarrhea.  Has had some abdominal discomfort in the left lower quadrant which has improved.   Pt was found to be hypotensive on presentation despite aggressive IVF hydration ultimately requiring pressor support  Assessment & Plan:   Principal Problem:   ARF (acute renal failure) (HCC) Active Problems:   OSA (obstructive sleep apnea), BiPAP   Essential hypertension   DM2 (diabetes mellitus, type 2) (HCC)   Psoriasis   Diabetic foot ulcer ([place])   Deep venous thrombosis of lower extremity (HCC)   Lymphocytic-plasmacytic colitis   Increased anion gap metabolic acidosis   Shock ([place])  1. Acute renal failure likely from dehydration and poor oral intake.   1. Presenting Cr noted to be elevated at 3.21, peaking to 3.67 2. Clinically dehydrated on exam 3. Cr did improve with IVF, currently 2.42 4. Will resume IVF hydration with LR at 75cc/hr 5. Repeat cmp in AM 6. Thus far, very good urine outpt with over 2.5L noted over past 24hrs 7. Cont to avoid nephrotoxic agents 2. Nausea and vomiting: 1. CT abdomen pelvis personally reviewed, noted to have diverticulosis without diverticulitis, otherwise unremarkable. Per my own read, appears to have stool throughout the colon through the rectal vault. Pt admits to change in bowel function around Thanksgiving, not having BM for several days 2. Unclear etiology for N/V 3. Moderate BM noted on 12/4. Pt reports no  real bowel movement prior to that for several days 4. Cont to encourage PO intake as tolerated 3. History of hypertension  1. Given ARF, currently holding outpatient lisinopril, hydrochlorothiazide 2. Continue on PRN hydralazine 4. Lactic acidosis and hypotension likely from dehydration.  1. Clinically dehydrated and required brief pressor support in ICU 2. Clinically seems improved 3. Continue LR at 75cc/hr as tolerated 5. Chronic wound of the left foot  1. Most recent xray from 11/29 reviewed with fracture through distal 3rd metatarsal with overlying soft tissue swelling 2. Will re-evaluate in AM 6. Sleep apnea  1. Continue on BiPAP as tolerated 7. Diabetes mellitus type 2 uncontrolled  1. A1c of 7.9 2. Glucose suboptimally controlled, likely made worse by stress dosed steroids, see below 3. Have increased basal insulin to home dose 4. Cont SSI coverage 5. Wean stress steroids as tolerated. 8. History of lymphocytic colitis  1. Continue on Budenoside. 9. History of psoriatic and rheumatoid arthritis  1. Pt has been continued on methotrexate and also infusions of immune therapy. 2. Seems to be stable at this time 10. Chronic Anemia  1. Remains hemodynamically stable without evidence of blood loss 2. Will repeat CBC in AM 11. History of gout on allopurinol.   1. Seems to be stable at this time 12. History of Covid infection  1. Chart reviewed. Pt was admitted in December 2020 for COVID pna 2. Stable on minimal O2 support at this time 13. Hypovolemic shock 1. Resolved 2. See above. Pt presented markedly hypovolemic and hypotensive, requiring pressor support 3. Pressors have been  discontinued, however pt remains on stress steroids 4. BP is currently stable and much improved. 5. Will begin weaning IV hydrocortisone from q6hr to q12hr  DVT prophylaxis: Lovenox subq Code Status: Full Family Communication: Pt in room, discussed with pt's daughter over phone  Status is:  Inpatient  Remains inpatient appropriate because:Unsafe d/c plan, IV treatments appropriate due to intensity of illness or inability to take PO and Inpatient level of care appropriate due to severity of illness   Dispo: The patient is from: Home              Anticipated d/c is to: Home              Anticipated d/c date is: 2 days              Patient currently is not medically stable to d/c.       Consultants:     Procedures:     Antimicrobials: Anti-infectives (From admission, onward)   Start     Dose/Rate Route Frequency Ordered Stop   07/13/20 0200  ceFEPIme (MAXIPIME) 2 g in sodium chloride 0.9 % 100 mL IVPB        2 g 200 mL/hr over 30 Minutes Intravenous Daily at bedtime 07/12/20 2251     07/12/20 2359  vancomycin (VANCOCIN) IVPB 1000 mg/200 mL premix        1,000 mg 200 mL/hr over 60 Minutes Intravenous  Once 07/12/20 2251 07/13/20 0143   07/12/20 2309  vancomycin variable dose per unstable renal function (pharmacist dosing)  Status:  Discontinued         Does not apply See admin instructions 07/12/20 2309 07/14/20 1054   07/12/20 2300  metroNIDAZOLE (FLAGYL) IVPB 500 mg        500 mg 100 mL/hr over 60 Minutes Intravenous Every 8 hours 07/12/20 2251     07/12/20 1830  piperacillin-tazobactam (ZOSYN) IVPB 3.375 g        3.375 g 100 mL/hr over 30 Minutes Intravenous  Once 07/12/20 1817 07/12/20 2119   07/12/20 1830  vancomycin (VANCOCIN) IVPB 1000 mg/200 mL premix        1,000 mg 200 mL/hr over 60 Minutes Intravenous  Once 07/12/20 1817 07/12/20 2352       Subjective: Reports feeling better today. Feels thirsty  Objective: Vitals:   07/14/20 0300 07/14/20 0737 07/14/20 1211 07/14/20 1717  BP: (!) 144/74 120/69 138/83 (!) 163/92  Pulse: 86 76 87 96  Resp: 13 16 19 20   Temp: 98.2 F (36.8 C) 97.9 F (36.6 C) 97.9 F (36.6 C) 98.5 F (36.9 C)  TempSrc: Axillary Oral Oral Oral  SpO2: 97% 95% 95% 94%  Weight:      Height:        Intake/Output Summary  (Last 24 hours) at 07/14/2020 1819 Last data filed at 07/14/2020 4193 Gross per 24 hour  Intake 700 ml  Output 1000 ml  Net -300 ml   Filed Weights   07/12/20 1130  Weight: 112.9 kg    Examination:  General exam: Appears calm and comfortable, mucus membranes dry Respiratory system: Clear to auscultation. Respiratory effort normal. Cardiovascular system: S1 & S2 heard, Regular Gastrointestinal system: Abdomen is nondistended, soft and nontender. No organomegaly or masses felt. Normal bowel sounds heard. Central nervous system: Alert and oriented. No focal neurological deficits. Extremities: Symmetric 5 x 5 power. Skin: No rashes, lesions Psychiatry: Judgement and insight appear normal. Mood & affect appropriate.   Data Reviewed: I have personally  reviewed following labs and imaging studies  CBC: Recent Labs  Lab 07/12/20 1140 07/12/20 2230 07/13/20 0511 07/13/20 2212 07/14/20 0606  WBC 17.1* 12.8* 12.4* 11.5* 13.8*  NEUTROABS  --   --  9.2*  --   --   HGB 11.6* 9.8* 9.7* 9.7* 9.9*  HCT 38.1 31.0* 30.6* 28.5* 30.8*  MCV 92.9 91.2 90.8 87.2 89.3  PLT 319 223 236 213 211   Basic Metabolic Panel: Recent Labs  Lab 07/12/20 1730 07/12/20 1730 07/12/20 1938 07/12/20 2230 07/13/20 0511 07/14/20 0042 07/14/20 0606  NA 126*  --  130*  --  128* 128* 132*  K 4.6  --  4.6  --  4.5 4.1 4.1  CL 87*  --  93*  --  93* 95* 97*  CO2 20*  --  23  --  23 22 22   GLUCOSE 230*  --  172*  --  174* 416* 303*  BUN 29*  --  27*  --  27* 26* 26*  CREATININE 3.67*   < > 3.37* 3.31* 3.13* 2.64* 2.42*  CALCIUM 9.2  --  8.8*  --  8.4* 8.2* 8.3*   < > = values in this interval not displayed.   GFR: Estimated Creatinine Clearance: 26 mL/min (A) (by C-G formula based on SCr of 2.42 mg/dL (H)). Liver Function Tests: Recent Labs  Lab 07/13/20 0511  AST 37  ALT 40  ALKPHOS 42  BILITOT 0.8  PROT 4.7*  ALBUMIN 2.3*   Recent Labs  Lab 07/13/20 1155  LIPASE 19   No results for  input(s): AMMONIA in the last 168 hours. Coagulation Profile: No results for input(s): INR, PROTIME in the last 168 hours. Cardiac Enzymes: No results for input(s): CKTOTAL, CKMB, CKMBINDEX, TROPONINI in the last 168 hours. BNP (last 3 results) No results for input(s): PROBNP in the last 8760 hours. HbA1C: Recent Labs    07/13/20 0500  HGBA1C 7.9*   CBG: Recent Labs  Lab 07/14/20 0237 07/14/20 0259 07/14/20 0736 07/14/20 1208 07/14/20 1715  GLUCAP 336* 312* 289* 362* 366*   Lipid Profile: No results for input(s): CHOL, HDL, LDLCALC, TRIG, CHOLHDL, LDLDIRECT in the last 72 hours. Thyroid Function Tests: No results for input(s): TSH, T4TOTAL, FREET4, T3FREE, THYROIDAB in the last 72 hours. Anemia Panel: No results for input(s): VITAMINB12, FOLATE, FERRITIN, TIBC, IRON, RETICCTPCT in the last 72 hours. Sepsis Labs: Recent Labs  Lab 07/12/20 1725 07/12/20 1938 07/12/20 2226 07/12/20 2230 07/13/20 0511  PROCALCITON  --   --   --  0.33  --   LATICACIDVEN 5.8* 4.5* 2.9*  --  2.6*    Recent Results (from the past 240 hour(s))  Resp Panel by RT-PCR (Flu A&B, Covid) Nasopharyngeal Swab     Status: None   Collection Time: 07/12/20  5:00 PM   Specimen: Nasopharyngeal Swab; Nasopharyngeal(NP) swabs in vial transport medium  Result Value Ref Range Status   SARS Coronavirus 2 by RT PCR NEGATIVE NEGATIVE Final    Comment: (NOTE) SARS-CoV-2 target nucleic acids are NOT DETECTED.  The SARS-CoV-2 RNA is generally detectable in upper respiratory specimens during the acute phase of infection. The lowest concentration of SARS-CoV-2 viral copies this assay can detect is 138 copies/mL. A negative result does not preclude SARS-Cov-2 infection and should not be used as the sole basis for treatment or other patient management decisions. A negative result may occur with  improper specimen collection/handling, submission of specimen other than nasopharyngeal swab, presence of viral  mutation(s)  within the areas targeted by this assay, and inadequate number of viral copies(<138 copies/mL). A negative result must be combined with clinical observations, patient history, and epidemiological information. The expected result is Negative.  Fact Sheet for Patients:  EntrepreneurPulse.com.au  Fact Sheet for Healthcare Providers:  IncredibleEmployment.be  This test is no t yet approved or cleared by the [place] FDA and  has been authorized for detection and/or diagnosis of SARS-CoV-2 by FDA under an Emergency Use Authorization (EUA). This EUA will remain  in effect (meaning this test can be used) for the duration of the COVID-19 declaration under Section 564(b)(1) of the Act, 21 U.S.C.section 360bbb-3(b)(1), unless the authorization is terminated  or revoked sooner.       Influenza A by PCR NEGATIVE NEGATIVE Final   Influenza B by PCR NEGATIVE NEGATIVE Final    Comment: (NOTE) The Xpert Xpress SARS-CoV-2/FLU/RSV plus assay is intended as an aid in the diagnosis of influenza from Nasopharyngeal swab specimens and should not be used as a sole basis for treatment. Nasal washings and aspirates are unacceptable for Xpert Xpress SARS-CoV-2/FLU/RSV testing.  Fact Sheet for Patients: EntrepreneurPulse.com.au  Fact Sheet for Healthcare Providers: IncredibleEmployment.be  This test is not yet approved or cleared by the [place] FDA and has been authorized for detection and/or diagnosis of SARS-CoV-2 by FDA under an Emergency Use Authorization (EUA). This EUA will remain in effect (meaning this test can be used) for the duration of the COVID-19 declaration under Section 564(b)(1) of the Act, 21 U.S.C. section 360bbb-3(b)(1), unless the authorization is terminated or revoked.  Performed at [facility], [place] [address] [place], [place] [address]   Blood culture (routine x 2)      Status: None (Preliminary result)   Collection Time: 07/12/20  5:25 PM   Specimen: BLOOD  Result Value Ref Range Status   Specimen Description BLOOD LEFT ANTECUBITAL  Final   Special Requests   Final    BOTTLES DRAWN AEROBIC AND ANAEROBIC Blood Culture results may not be optimal due to an excessive volume of blood received in culture bottles   Culture   Final    NO GROWTH 2 DAYS Performed at [facility], [place] [address], [place], [place] [address]    Report Status PENDING  Incomplete  Blood culture (routine x 2)     Status: None (Preliminary result)   Collection Time: 07/12/20  5:45 PM   Specimen: BLOOD  Result Value Ref Range Status   Specimen Description BLOOD SITE NOT SPECIFIED  Final   Special Requests   Final    BOTTLES DRAWN AEROBIC AND ANAEROBIC Blood Culture adequate volume   Culture   Final    NO GROWTH 2 DAYS Performed at [facility], [employer] [address], [place], [place] [address]    Report Status PENDING  Incomplete  MRSA PCR Screening     Status: None   Collection Time: 07/13/20  5:07 PM   Specimen: Urine, Unspecified Source; Nasopharyngeal  Result Value Ref Range Status   MRSA by PCR NEGATIVE NEGATIVE Final    Comment:        The GeneXpert MRSA Assay (FDA approved for NASAL specimens only), is one component of a comprehensive MRSA colonization surveillance program. It is not intended to diagnose MRSA infection nor to guide or monitor treatment for MRSA infections. Performed at [facility], [place] [address], [place], [place] [address]      Radiology Studies: CT Abdomen Pelvis Wo  Contrast  Result Date: 07/12/2020 CLINICAL DATA:  Abdominal distension, anorexia EXAM: CT ABDOMEN AND PELVIS WITHOUT CONTRAST TECHNIQUE: Multidetector CT imaging of the abdomen and pelvis was performed following the standard protocol without IV contrast. COMPARISON:  11/02/2018 FINDINGS: Lower chest: No acute pleural or parenchymal lung disease. Hepatobiliary: No focal  liver abnormality is seen. No gallstones, gallbladder wall thickening, or biliary dilatation. Pancreas: Unremarkable. No pancreatic ductal dilatation or surrounding inflammatory changes. Spleen: Normal in size without focal abnormality. Adrenals/Urinary Tract: No urinary tract calculi or obstructive uropathy. The bladder is unremarkable. The adrenals are normal. Stomach/Bowel: No bowel obstruction or ileus. Diverticulosis of the colon with no evidence of acute diverticulitis. Normal appendix right lower quadrant. No bowel wall thickening or inflammatory change. Vascular/Lymphatic: Aortic atherosclerosis. No enlarged abdominal or pelvic lymph nodes. Reproductive: Uterus and bilateral adnexa are unremarkable. Other: No free fluid or free gas.  No abdominal wall hernia. Musculoskeletal: Postsurgical changes from right hip arthroplasty. No acute or destructive bony lesions. Reconstructed images demonstrate no additional findings. IMPRESSION: 1. Diverticulosis without diverticulitis. 2. No acute intra-abdominal or intrapelvic process. 3.  Aortic Atherosclerosis (ICD10-I70.0). Electronically Signed   By: Randa Ngo M.D.   On: 07/12/2020 20:48    Scheduled Meds: . allopurinol  100 mg Oral BID  . ascorbic acid  500 mg Oral Daily  . aspirin EC  81 mg Oral Q72H  . budesonide  9 mg Oral Q breakfast  . Chlorhexidine Gluconate Cloth  6 each Topical Daily  . citalopram  40 mg Oral Daily  . enoxaparin (LOVENOX) injection  30 mg Subcutaneous Daily  . folic acid  1 mg Oral Daily  . [START ON 07/15/2020] hydrocortisone sod succinate (SOLU-CORTEF) inj  50 mg Intravenous Q12H  . insulin aspart  0-9 Units Subcutaneous Q4H  . [START ON 07/15/2020] insulin detemir  20 Units Subcutaneous Daily  . melatonin  5 mg Oral QHS  . mupirocin ointment  1 application Nasal BID  . polyethylene glycol  17 g Oral Daily  . prednisoLONE acetate  1 drop Right Eye TID AC & HS   Followed by  . [START ON 07/19/2020] prednisoLONE acetate   1 drop Right Eye BID WC  . rosuvastatin  5 mg Oral QHS   Continuous Infusions: . ceFEPime (MAXIPIME) IV 2 g (07/13/20 2040)  . lactated ringers 75 mL/hr at 07/14/20 1242  . metronidazole 500 mg (07/14/20 1455)  . sodium chloride       LOS: 2 days   Marylu Lund, MD [facility] Pager On Amion  If 7PM-7AM, please contact night-coverage 07/14/2020, 6:19 PM

## 2020-07-15 DIAGNOSIS — E860 Dehydration: Secondary | ICD-10-CM

## 2020-07-15 LAB — GLUCOSE, CAPILLARY
Glucose-Capillary: 292 mg/dL — ABNORMAL HIGH (ref 70–99)
Glucose-Capillary: 297 mg/dL — ABNORMAL HIGH (ref 70–99)
Glucose-Capillary: 257 mg/dL — ABNORMAL HIGH (ref 70–99)
Glucose-Capillary: 283 mg/dL — ABNORMAL HIGH (ref 70–99)
Glucose-Capillary: 321 mg/dL — ABNORMAL HIGH (ref 70–99)
Glucose-Capillary: 286 mg/dL — ABNORMAL HIGH (ref 70–99)

## 2020-07-15 LAB — CBC
HCT: 29.8 % — ABNORMAL LOW (ref 36.0–46.0)
Hemoglobin: 9.7 g/dL — ABNORMAL LOW (ref 12.0–15.0)
MCH: 28.8 pg (ref 26.0–34.0)
MCHC: 32.6 g/dL (ref 30.0–36.0)
MCV: 88.4 fL (ref 80.0–100.0)
Platelets: 217 10*3/uL (ref 150–400)
RBC: 3.37 MIL/uL — ABNORMAL LOW (ref 3.87–5.11)
RDW: 17.2 % — ABNORMAL HIGH (ref 11.5–15.5)
WBC: 13.5 10*3/uL — ABNORMAL HIGH (ref 4.0–10.5)
nRBC: 0 % (ref 0.0–0.2)

## 2020-07-15 LAB — COMPREHENSIVE METABOLIC PANEL
ALT: 31 U/L (ref 0–44)
AST: 25 U/L (ref 15–41)
Albumin: 2.3 g/dL — ABNORMAL LOW (ref 3.5–5.0)
Alkaline Phosphatase: 42 U/L (ref 38–126)
Anion gap: 11 (ref 5–15)
BUN: 34 mg/dL — ABNORMAL HIGH (ref 8–23)
CO2: 24 mmol/L (ref 22–32)
Calcium: 8.1 mg/dL — ABNORMAL LOW (ref 8.9–10.3)
Chloride: 95 mmol/L — ABNORMAL LOW (ref 98–111)
Creatinine, Ser: 2.16 mg/dL — ABNORMAL HIGH (ref 0.44–1.00)
GFR, Estimated: 23 mL/min — ABNORMAL LOW (ref >60)
Glucose, Bld: 325 mg/dL — ABNORMAL HIGH (ref 70–99)
Potassium: 3.7 mmol/L (ref 3.5–5.1)
Sodium: 130 mmol/L — ABNORMAL LOW (ref 135–145)
Total Bilirubin: 0.6 mg/dL (ref 0.3–1.2)
Total Protein: 5.1 g/dL — ABNORMAL LOW (ref 6.5–8.1)

## 2020-07-15 LAB — URINE CULTURE: Culture: NO GROWTH

## 2020-07-15 MED ORDER — SODIUM CHLORIDE 0.9 % IV SOLN: INTRAVENOUS | Status: DC

## 2020-07-15 MED ORDER — INSULIN ASPART 100 UNIT/ML ~~LOC~~ SOLN
5.0000 [IU] | Freq: Three times a day (TID) | SUBCUTANEOUS | Status: DC
  Administered 2020-07-15 – 2020-07-16 (×4): 5 [IU] via SUBCUTANEOUS

## 2020-07-15 MED ORDER — INSULIN DETEMIR 100 UNIT/ML ~~LOC~~ SOLN
20.0000 [IU] | Freq: Two times a day (BID) | SUBCUTANEOUS | Status: DC
  Administered 2020-07-15 – 2020-07-17 (×4): 20 [IU] via SUBCUTANEOUS
  Filled 2020-07-15 (×5): qty 0.2

## 2020-07-15 MED ORDER — ALBUMIN HUMAN 25 % IV SOLN
12.5000 g | Freq: Once | INTRAVENOUS | Status: AC
  Administered 2020-07-15: 12.5 g via INTRAVENOUS
  Filled 2020-07-15: qty 50

## 2020-07-15 MED ORDER — INSULIN ASPART 100 UNIT/ML ~~LOC~~ SOLN
0.0000 [IU] | Freq: Three times a day (TID) | SUBCUTANEOUS | Status: DC
  Administered 2020-07-15: 8 [IU] via SUBCUTANEOUS
  Administered 2020-07-15 – 2020-07-16 (×3): 11 [IU] via SUBCUTANEOUS
  Administered 2020-07-16 – 2020-07-17 (×3): 2 [IU] via SUBCUTANEOUS

## 2020-07-15 MED ORDER — INSULIN ASPART 100 UNIT/ML ~~LOC~~ SOLN
0.0000 [IU] | Freq: Every day | SUBCUTANEOUS | Status: DC
  Administered 2020-07-15: 3 [IU] via SUBCUTANEOUS
  Administered 2020-07-16: 2 [IU] via SUBCUTANEOUS

## 2020-07-15 NOTE — Progress Notes (Signed)
Inpatient Diabetes Program Recommendations  AACE/ADA: New Consensus Statement on Inpatient Glycemic Control  Target Ranges:  Prepandial:   less than 140 mg/dL      Peak postprandial:   less than 180 mg/dL (1-2 hours)      Critically ill patients:  140 - 180 mg/dL   Results for PRUE, LINGENFELTER (MRN [id number]) as of 07/15/2020 09:33  Ref. Range 07/14/2020 07:36 07/14/2020 12:08 07/14/2020 17:15 07/14/2020 23:37 07/15/2020 03:41 07/15/2020 07:42  Glucose-Capillary Latest Ref Range: 70 - 99 mg/dL 289 (H) 362 (H) 366 (H) 357 (H) 297 (H) 257 (H)   Review of Glycemic Control  Diabetes history: DM2 Outpatient Diabetes medications: Levemir 20 units BID, Metformin XR 1000 mg BID Current orders for Inpatient glycemic control: Levemir 20 units daily, Novolog 0-15 units TID with meals, Novolog 0-5 units QHS  Inpatient Diabetes Program Recommendations:    Insulin: Please consider increasing Levemir to 20 units BID and adding Novolog 3 units TID with meals for meal coverage if patient eats at least 50% of meals.  Thanks, Barnie Alderman, RN, MSN, CDE Diabetes Coordinator Inpatient Diabetes Program [phone] (Team Pager from 8am to 5pm)

## 2020-07-15 NOTE — Evaluation (Signed)
Occupational Therapy Evaluation Patient Details Name: [name] [name] MRN: [id number] DOB: 09-13-1945 Today's Date: 07/15/2020    History of Present Illness 74 y.o.femalewithPMH including diabetes mellitus type 2, HTN, lymphocytic colitis, psoriatic and rheumatoid arthritis on immunosuppressants, sleep apnea previous history of DVT presents to the ER after patient was feeling weak and dizzy with persistent nausea with some vomiting unable to eat well almost a week now. Has had some abdominal discomfort in the left lower quadrant which has improved. Pt was found to be hypotensive on presentation despite aggressive IVF hydration ultimately requiring pressor support.   Clinical Impression   Pt from home, recently Pt has been mobilizing at [facility] level with use of RW in attempts to keep weight off LLE to allow foot to heal. She uses DME and is mod I for ADL (completes at sitting level). Husband does cooking currently. Has good support from daughter (and family) in addition to husband. Today she is mod A for stand pivot transfers with lift assist on L side, she is mod to max A for LB ADL. Initiated education on AE for LB ADL (long handle sponge in particular for assist bathing). Pt would benefit from skilled OT in the acute setting and afterwards at the [facility] with special focus on access to LB for ADL and safety strategies/practice for shower EXIT. OT will continue to follow acutely with next session to focus on transfer training and practice with AE for LB ADL.     Follow Up Recommendations  Home health OT;Supervision/Assistance - 24 hour    Equipment Recommendations  3 in 1 bedside commode (WIDE; long handle sponge)    Recommendations for Other Services       Precautions / Restrictions Precautions Precautions: Fall;Other (comment) Precaution Comments: L foot cast and boot  Required Braces or Orthoses: Splint/Cast Splint/Cast: L foot boot with cast to protect skin Restrictions Weight Bearing  Restrictions: No Other Position/Activity Restrictions: monitor for overuse of LLE      Mobility Bed Mobility Overal bed mobility: Needs Assistance Bed Mobility: Supine to Sit     Supine to sit: Mod assist     General bed mobility comments: EOB with PT and in recliner at end of session    Transfers Overall transfer level: Needs assistance Equipment used: Rolling walker (2 wheeled);1 person hand held assist Transfers: Sit to/from Stand Sit to Stand: Mod assist         General transfer comment: mod A for boost supporting on L side, vc for safe hand placement    Balance Overall balance assessment: History of Falls;Needs assistance Sitting-balance support: Feet supported;Bilateral upper extremity supported Sitting balance-Leahy Scale: Fair     Standing balance support: Bilateral upper extremity supported;During functional activity Standing balance-Leahy Scale: Poor Standing balance comment: pt had a fall on the stairs at home, mult falls in history                           ADL either performed or assessed with clinical judgement   ADL Overall ADL's : Needs assistance/impaired Eating/Feeding: Set up   Grooming: Set up;Wash/dry face;Sitting Grooming Details (indicate cue type and reason): in recliner, no problem bringing hands to face Upper Body Bathing: Minimal assistance   Lower Body Bathing: Moderate assistance Lower Body Bathing Details (indicate cue type and reason): educated Pt and husband on long handle sponge Upper Body Dressing : Min guard   Lower Body Dressing: Maximal assistance;Sit to/from stand Lower Body Dressing  Details (indicate cue type and reason): due to assist into standing. Initiated education on  LB AE Toilet Transfer: Moderate assistance;Stand-pivot;RW;Requires wide/bariatric Armed forces technical officer Details (indicate cue type and reason): support on L side  Toileting- Clothing Manipulation and Hygiene: Maximal assistance;Sit to/from stand        Functional mobility during ADLs: Minimal assistance;Cueing for safety;Rolling walker General ADL Comments: decreased access to LB for ADL, and requiring assist with transfers sit>stand     Vision Baseline Vision/History: Wears glasses Patient Visual Report: No change from baseline       Perception     Praxis      Pertinent Vitals/Pain Pain Assessment: No/denies pain     Hand Dominance Right   Extremity/Trunk Assessment Upper Extremity Assessment Upper Extremity Assessment: Generalized weakness   Lower Extremity Assessment Lower Extremity Assessment: Defer to PT evaluation   Cervical / Trunk Assessment Cervical / Trunk Assessment: Kyphotic (mild)   Communication Communication Communication: No difficulties   Cognition Arousal/Alertness: Awake/alert Behavior During Therapy: WFL for tasks assessed/performed Overall Cognitive Status: Within Functional Limits for tasks assessed                                     General Comments  VSS throughout session    Exercises Exercises: Other exercises (LE strength is 4 to 4+ knees and ankles, hips 3)   Shoulder Instructions      Home Living Family/patient expects to be discharged to:: Private residence Living Arrangements: Spouse/significant other Available Help at Discharge: Family;Available 24 hours/day Type of Home: House Home Access: Stairs to enter [employer] of Steps: 5 Entrance Stairs-Rails: Can reach both;Left;Right Home Layout: One level     Bathroom Shower/Tub: Occupational psychologist: Standard Bathroom Accessibility: Yes How Accessible: Accessible via walker Home Equipment: [place] - 2 wheels;Wheelchair - manual;Shower seat;Grab bars - tub/shower;Grab bars - toilet;Hand held shower head          Prior Functioning/Environment Level of Independence: Needs assistance  Gait / Transfers Assistance Needed: husband assists to stand and then to wheelchair ADL's /  Homemaking Assistance Needed: has family assistance for house and cooking   Comments: Pt was set to start OPPT today at [facility]        OT Problem List: Decreased strength;Decreased activity tolerance;Impaired balance (sitting and/or standing);Decreased safety awareness;Decreased knowledge of use of DME or AE;Cardiopulmonary status limiting activity;Obesity      OT Treatment/Interventions: Self-care/ADL training;DME and/or AE instruction;Therapeutic activities;Patient/family education;Balance training    OT Goals(Current goals can be found in the care plan section) Acute Rehab OT Goals Patient Stated Goal: feel safer getting out of the shower and be able to do stairs better OT Goal Formulation: With patient/family Time For Goal Achievement: 07/29/20 Potential to Achieve Goals: Good ADL Goals Pt Will Perform Grooming: with modified independence;sitting Pt Will Perform Lower Body Bathing: sit to/from stand;with min guard assist;with adaptive equipment Pt Will Perform Lower Body Dressing: with min guard assist;with caregiver independent in assisting;sit to/from stand;with adaptive equipment Pt Will Transfer to Toilet: with min guard assist;stand pivot transfer Pt Will Perform Toileting - Clothing Manipulation and hygiene: with min guard assist;with caregiver independent in assisting;with adaptive equipment;sit to/from stand  OT Frequency: Min 2X/week   Barriers to D/C:    needs to be able to get up 5 steps       Co-evaluation  AM-PAC OT "6 Clicks" Daily Activity     Outcome Measure Help from another person eating meals?: A Little Help from another person taking care of personal grooming?: A Little Help from another person toileting, which includes using toliet, bedpan, or urinal?: A Lot Help from another person bathing (including washing, rinsing, drying)?: A Lot Help from another person to put on and taking off regular upper body clothing?: A Little Help from  another person to put on and taking off regular lower body clothing?: A Lot 6 Click Score: 15   End of Session Equipment Utilized During Treatment: Rolling walker;Other (comment) (cast/boot on LLE) Nurse Communication: Mobility status  Activity Tolerance: Patient tolerated treatment well Patient left: in chair;with call bell/phone within reach;with family/visitor present  OT Visit Diagnosis: Unsteadiness on feet (R26.81);Other abnormalities of gait and mobility (R26.89);Muscle weakness (generalized) (M62.81);History of falling (Z91.81)                Time: 1694-5038 OT Time Calculation (min): 41 min Charges:  OT General Charges $OT Visit: 1 Visit OT Evaluation $OT Eval Moderate Complexity: 1 Mod OT Treatments $Self Care/Home Management : 8-22 mins  Jesse Sans OTR/L Acute Rehabilitation Services Pager: [phone] Office: [place] 07/15/2020, 2:59 PM

## 2020-07-15 NOTE — Consult Note (Signed)
WOC Nurse Consult Note: Reason for Consult:LEft charcot foot deformity, repaired and in cast. Seen at wound care center and nonremovable casting in place.  No wound care needs at this time. Has follow up appointment with Dr Dellia Nims.  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  HAs scattered ecchymosis to arms and hands. States skin is paper thin from long term steroidal use due to arthritis.  Pressure Injury POA: NA No WOC needs at this time.  Will not follow at this time.  Please re-consult if needed.  Domenic Moras MSN, RN, FNP-BC CWON Wound, Ostomy, Continence Nurse Pager [phone]

## 2020-07-15 NOTE — Evaluation (Signed)
Physical Therapy Evaluation Patient Details Name: [name] [name] MRN: [id number] DOB: 11-09-1945 Today's Date: 07/15/2020   History of Present Illness  74 y.o.femalewithPMH including diabetes mellitus type 2, HTN, lymphocytic colitis, psoriatic and rheumatoid arthritis on immunosuppressants, sleep apnea previous history of DVT presents to the ER after patient was feeling weak and dizzy with persistent nausea with some vomiting unable to eat well almost a week now. Has had some abdominal discomfort in the left lower quadrant which has improved. Pt was found to be hypotensive on presentation despite aggressive IVF hydration ultimately requiring pressor support.  Clinical Impression  Pt was seen for follow up from referral to get up to side of bed and step at bed, then to chair.  Pt is in attendance with her husband, who is a caregiver and aware of the challenges she faces at home.  Follow for goals of PT as outlined in POC, and work toward safer steps, awareness of limiting wb on LLE and safety of all transfers.  Pt is motivated to go home but this requires her to be able to get up 5 steps.    Follow Up Recommendations Home health PT;Supervision for mobility/OOB;Supervision/Assistance - 24 hour    Equipment Recommendations  None recommended by PT    Recommendations for Other Services       Precautions / Restrictions Precautions Precautions: Fall;Other (comment) Precaution Comments: L foot cast and boot  Required Braces or Orthoses: Splint/Cast Splint/Cast: L foot boot with cast to protect skin Restrictions Weight Bearing Restrictions: No Other Position/Activity Restrictions: monitor for overuse of LLE      Mobility  Bed Mobility Overal bed mobility: Needs Assistance Bed Mobility: Supine to Sit     Supine to sit: Mod assist     General bed mobility comments: mod to assist to scoot forward on bed    Transfers Overall transfer level: Needs assistance Equipment used: Rolling  walker (2 wheeled);1 person hand held assist Transfers: Sit to/from Stand Sit to Stand: Mod assist;Max assist         General transfer comment: heavy max on L side initially, then mod from L side;  cannot stand without assist on L side  Ambulation/Gait Ambulation/Gait assistance: Min assist Gait Distance (Feet): 4 Feet Assistive device: Rolling walker (2 wheeled);1 person hand held assist (one person to guard with chair) Gait Pattern/deviations: Step-to pattern;Decreased stride length;Decreased weight shift to left;Wide base of support;Trunk flexed Gait velocity: reduced   General Gait Details: pt is mildly flexed on posture in standing but aware to push off walker and stand up  Stairs            Wheelchair Mobility    Modified Rankin (Stroke Patients Only)       Balance Overall balance assessment: History of Falls;Needs assistance Sitting-balance support: Feet supported;Bilateral upper extremity supported Sitting balance-Leahy Scale: Fair     Standing balance support: Bilateral upper extremity supported;During functional activity Standing balance-Leahy Scale: Poor Standing balance comment: pt had a fall on the stairs at home, mult falls in history                             Pertinent Vitals/Pain Pain Assessment: No/denies pain    Home Living Family/patient expects to be discharged to:: Private residence Living Arrangements: Spouse/significant other Available Help at Discharge: Family;Available 24 hours/day Type of Home: House Home Access: Stairs to enter Entrance Stairs-Rails: Can reach both;Left;Right Entrance Stairs-Number of Steps: 5 Home Layout:  One level Home Equipment: [place] - 2 wheels;Wheelchair - Brewing technologist      Prior Function Level of Independence: Needs assistance   Gait / Transfers Assistance Needed: husband assists to stand and then to wheelchair  ADL's / Homemaking Assistance Needed: has family assistance for house and  cooking        Hand Dominance   Dominant Hand: Right    Extremity/Trunk Assessment   Upper Extremity Assessment Upper Extremity Assessment: Defer to OT evaluation    Lower Extremity Assessment Lower Extremity Assessment: Generalized weakness (weakness in hips)    Cervical / Trunk Assessment Cervical / Trunk Assessment: Kyphotic (mild)  Communication   Communication: No difficulties  Cognition Arousal/Alertness: Awake/alert Behavior During Therapy: WFL for tasks assessed/performed Overall Cognitive Status: Within Functional Limits for tasks assessed                                        General Comments General comments (skin integrity, edema, etc.): pt is more stable once standing than in transition to stand, requires cues for set up to line up at chair    Exercises     Assessment/Plan    PT Assessment Patient needs continued PT services  PT Problem List Decreased strength;Decreased range of motion;Decreased activity tolerance;Decreased balance;Decreased mobility;Decreased coordination;Decreased cognition;Decreased knowledge of use of DME;Decreased safety awareness;Decreased skin integrity       PT Treatment Interventions DME instruction;Gait training;Stair training;Functional mobility training;Therapeutic activities;Therapeutic exercise;Balance training;Neuromuscular re-education;Patient/family education    PT Goals (Current goals can be found in the Care Plan section)  Acute Rehab PT Goals Patient Stated Goal: to get home and do HHPT to return to outpatient PT plan eventually PT Goal Formulation: With patient/family Time For Goal Achievement: 07/29/20 Potential to Achieve Goals: Good    Frequency Min 3X/week   Barriers to discharge Inaccessible home environment;Decreased caregiver support has stairs to enter house and husband is there assisting    Co-evaluation               AM-PAC PT "6 Clicks" Mobility  Outcome Measure Help  needed turning from your back to your side while in a flat bed without using bedrails?: A Lot Help needed moving from lying on your back to sitting on the side of a flat bed without using bedrails?: A Lot Help needed moving to and from a bed to a chair (including a wheelchair)?: A Lot Help needed standing up from a chair using your arms (e.g., wheelchair or bedside chair)?: A Lot Help needed to walk in hospital room?: A Lot Help needed climbing 3-5 steps with a railing? : Total 6 Click Score: 11    End of Session Equipment Utilized During Treatment: Gait belt Activity Tolerance: Patient limited by fatigue;Treatment limited secondary to medical complications (Comment) Patient left: in chair;with call bell/phone within reach;with family/visitor present;with nursing/sitter in room Nurse Communication: Mobility status;Weight bearing status PT Visit Diagnosis: Unsteadiness on feet (R26.81);Muscle weakness (generalized) (M62.81);History of falling (Z91.81);Difficulty in walking, not elsewhere classified (R26.2);Other (comment) (L foot skin breakdown)    Time: 5056-9794 PT Time Calculation (min) (ACUTE ONLY): 35 min   Charges:   PT Evaluation $PT Eval Moderate Complexity: 1 Mod PT Treatments $Gait Training: 8-22 mins       Ramond Dial 07/15/2020, 1:19 PM  Mee Hives, PT MS Acute Rehab Dept. Number: [place] [facility] [place]

## 2020-07-15 NOTE — Progress Notes (Signed)
PROGRESS NOTE    [name] [name]  YKD:[id number] DOB: [birth year] DOA: 07/12/2020 PCP: Binnie Rail, MD    Brief Narrative:  74 y.o. female with history of diabetes mellitus type 2, hypertension, lymphocytic colitis, psoriatic and rheumatoid arthritis on immunosuppressants, sleep apnea previous history of DVT presents to the ER after patient was feeling weak and dizzy with persistent nausea with some vomiting unable to eat well since Thanksgiving almost a week now.  Denies any fever chills.  Denies chest pain or shortness of breath.  Denies diarrhea.  Has had some abdominal discomfort in the left lower quadrant which has improved.   Pt was found to be hypotensive on presentation despite aggressive IVF hydration ultimately requiring pressor support  Assessment & Plan:   Principal Problem:   ARF (acute renal failure) (HCC) Active Problems:   OSA (obstructive sleep apnea), BiPAP   Essential hypertension   DM2 (diabetes mellitus, type 2) (HCC)   Psoriasis   Diabetic foot ulcer ([place])   Deep venous thrombosis of lower extremity (HCC)   Lymphocytic-plasmacytic colitis   Increased anion gap metabolic acidosis   Shock ([place])  1. Acute renal failure likely from dehydration and poor oral intake.   1. Presenting Cr noted to be elevated at 3.21, peaking to 3.67 2. Remains clinically dehydrated on exam with dry mucus membranes 3. Cr did improve with IVF, currently down to 2.16 4. Will resume IVF hydration, changed to NS at 100cc/hr 5. Repeat cmp in AM 6. Cont to avoid nephrotoxic agents 2. Nausea and vomiting: 1. CT abdomen pelvis personally reviewed, noted to have diverticulosis without diverticulitis, otherwise unremarkable. Per my own read, appears to have stool throughout the colon through the rectal vault. Pt admits to change in bowel function around Thanksgiving, not having BM for several days 2. Unclear etiology for N/V 3. Moderate BM noted on 12/4 and again today 4. Continued on daily  miralax 5. Cont to encourage PO intake as tolerated 3. History of hypertension  1. Given ARF, currently holding outpatient lisinopril, hydrochlorothiazide 2. Continue on PRN hydralazine 3. Now off stress steroids 4. BP is stable at this time 4. Lactic acidosis and hypotension likely from dehydration.  1. Overall improved, but still remains clinically dehydrated 2. Continue on NS at 100cc/hr as tolerated 5. Chronic wound of the left foot  1. Most recent xray from 11/29 reviewed with fracture through distal 3rd metatarsal with overlying soft tissue swelling 2. Seen by WOC, appreciate input 3. Also seen by therapy. Thus far, recs for HHPT/OT 6. Sleep apnea  1. Continue on BiPAP as tolerated 7. Diabetes mellitus type 2 uncontrolled  1. A1c of 7.9 2. Glucose suboptimally controlled, likely made worse by recent stress dosed steroids, see below 3. Have increased basal insulin to 20 units BID with meal coverage per Diabetic Coordinator recommendations 4. Cont SSI coverage 5. Have d/c'd stress steroids 8. History of lymphocytic colitis  1. Continue on [place]. 2. CT without bowel wall thickening or inflammatory change 9. History of psoriatic and rheumatoid arthritis  1. Pt has been continued on methotrexate and also infusions of immune therapy. 2. Seems to be stable at this time 10. Chronic Anemia  1. Remains hemodynamically stable without evidence of blood loss 2. Recheck CBC in AM 11. History of gout on allopurinol.   1. Seems to be stable at this time 12. History of Covid infection  1. Chart reviewed. Pt was admitted in December 2020 for COVID pna 2. Stable on minimal O2 support  at this time 70. Hypovolemic shock 1. Resolved 2. See above. Pt presented markedly hypovolemic and hypotensive, requiring pressor support 3. Pressors and now stress steroids have been discontinued 4. BP is currently stable  DVT prophylaxis: Lovenox subq Code Status: Full Family Communication: Pt in  room, discussed with pt's daughter over phone  Status is: Inpatient  Remains inpatient appropriate because:Unsafe d/c plan, IV treatments appropriate due to intensity of illness or inability to take PO and Inpatient level of care appropriate due to severity of illness   Dispo: The patient is from: Home              Anticipated d/c is to: Home              Anticipated d/c date is: 2 days              Patient currently is not medically stable to d/c.   Consultants:     Procedures:     Antimicrobials: Anti-infectives (From admission, onward)   Start     Dose/Rate Route Frequency Ordered Stop   07/13/20 0200  ceFEPIme (MAXIPIME) 2 g in sodium chloride 0.9 % 100 mL IVPB        2 g 200 mL/hr over 30 Minutes Intravenous Daily at bedtime 07/12/20 2251     07/12/20 2359  vancomycin (VANCOCIN) IVPB 1000 mg/200 mL premix        1,000 mg 200 mL/hr over 60 Minutes Intravenous  Once 07/12/20 2251 07/13/20 0143   07/12/20 2309  vancomycin variable dose per unstable renal function (pharmacist dosing)  Status:  Discontinued         Does not apply See admin instructions 07/12/20 2309 07/14/20 1054   07/12/20 2300  metroNIDAZOLE (FLAGYL) IVPB 500 mg        500 mg 100 mL/hr over 60 Minutes Intravenous Every 8 hours 07/12/20 2251     07/12/20 1830  piperacillin-tazobactam (ZOSYN) IVPB 3.375 g        3.375 g 100 mL/hr over 30 Minutes Intravenous  Once 07/12/20 1817 07/12/20 2119   07/12/20 1830  vancomycin (VANCOCIN) IVPB 1000 mg/200 mL premix        1,000 mg 200 mL/hr over 60 Minutes Intravenous  Once 07/12/20 1817 07/12/20 2352      Subjective: States feeling somewhat better today  Objective: Vitals:   07/15/20 0339 07/15/20 0744 07/15/20 1136 07/15/20 1538  BP: (!) 161/84 (!) 159/88 (!) 159/90 (!) 144/68  Pulse: 71  74 76  Resp: 20 20  16   Temp: 98.2 F (36.8 C) 97.7 F (36.5 C) 98.1 F (36.7 C) 97.8 F (36.6 C)  TempSrc: Oral Oral Oral Oral  SpO2: 93% 97%  95%  Weight:       Height:        Intake/Output Summary (Last 24 hours) at 07/15/2020 1634 Last data filed at 07/15/2020 1502 Gross per 24 hour  Intake 2130.32 ml  Output 800 ml  Net 1330.32 ml   Filed Weights   07/12/20 1130  Weight: 112.9 kg    Examination: General exam: Awake, laying in bed, in nad, mucus membranes remain dry Respiratory system: Normal respiratory effort, no wheezing Cardiovascular system: regular rate, s1, s2 Gastrointestinal system: Soft, nondistended, positive BS Central nervous system: CN2-12 grossly intact, strength intact Extremities: Perfused, no clubbing Skin: Normal skin turgor, no notable skin lesions seen Psychiatry: Mood normal // no visual hallucinations   Data Reviewed: I have personally reviewed following labs and imaging studies  CBC:  Recent Labs  Lab 07/12/20 2230 07/13/20 0511 07/13/20 2212 07/14/20 0606 07/15/20 0259  WBC 12.8* 12.4* 11.5* 13.8* 13.5*  NEUTROABS  --  9.2*  --   --   --   HGB 9.8* 9.7* 9.7* 9.9* 9.7*  HCT 31.0* 30.6* 28.5* 30.8* 29.8*  MCV 91.2 90.8 87.2 89.3 88.4  PLT 223 236 213 222 681   Basic Metabolic Panel: Recent Labs  Lab 07/12/20 1938 07/12/20 1938 07/12/20 2230 07/13/20 0511 07/14/20 0042 07/14/20 0606 07/15/20 0259  NA 130*  --   --  128* 128* 132* 130*  K 4.6  --   --  4.5 4.1 4.1 3.7  CL 93*  --   --  93* 95* 97* 95*  CO2 23  --   --  23 22 22 24   GLUCOSE 172*  --   --  174* 416* 303* 325*  BUN 27*  --   --  27* 26* 26* 34*  CREATININE 3.37*   < > 3.31* 3.13* 2.64* 2.42* 2.16*  CALCIUM 8.8*  --   --  8.4* 8.2* 8.3* 8.1*   < > = values in this interval not displayed.   GFR: Estimated Creatinine Clearance: 29.1 mL/min (A) (by C-G formula based on SCr of 2.16 mg/dL (H)). Liver Function Tests: Recent Labs  Lab 07/13/20 0511 07/15/20 0259  AST 37 25  ALT 40 31  ALKPHOS 42 42  BILITOT 0.8 0.6  PROT 4.7* 5.1*  ALBUMIN 2.3* 2.3*   Recent Labs  Lab 07/13/20 1155  LIPASE 19   No results for  input(s): AMMONIA in the last 168 hours. Coagulation Profile: No results for input(s): INR, PROTIME in the last 168 hours. Cardiac Enzymes: No results for input(s): CKTOTAL, CKMB, CKMBINDEX, TROPONINI in the last 168 hours. BNP (last 3 results) No results for input(s): PROBNP in the last 8760 hours. HbA1C: Recent Labs    07/13/20 0500  HGBA1C 7.9*   CBG: Recent Labs  Lab 07/14/20 2337 07/15/20 0341 07/15/20 0742 07/15/20 1138 07/15/20 1540  GLUCAP 357* 297* 257* 283* 321*   Lipid Profile: No results for input(s): CHOL, HDL, LDLCALC, TRIG, CHOLHDL, LDLDIRECT in the last 72 hours. Thyroid Function Tests: No results for input(s): TSH, T4TOTAL, FREET4, T3FREE, THYROIDAB in the last 72 hours. Anemia Panel: No results for input(s): VITAMINB12, FOLATE, FERRITIN, TIBC, IRON, RETICCTPCT in the last 72 hours. Sepsis Labs: Recent Labs  Lab 07/12/20 1725 07/12/20 1938 07/12/20 2226 07/12/20 2230 07/13/20 0511  PROCALCITON  --   --   --  0.33  --   LATICACIDVEN 5.8* 4.5* 2.9*  --  2.6*    Recent Results (from the past 240 hour(s))  Resp Panel by RT-PCR (Flu A&B, Covid) Nasopharyngeal Swab     Status: None   Collection Time: 07/12/20  5:00 PM   Specimen: Nasopharyngeal Swab; Nasopharyngeal(NP) swabs in vial transport medium  Result Value Ref Range Status   SARS Coronavirus 2 by RT PCR NEGATIVE NEGATIVE Final    Comment: (NOTE) SARS-CoV-2 target nucleic acids are NOT DETECTED.  The SARS-CoV-2 RNA is generally detectable in upper respiratory specimens during the acute phase of infection. The lowest concentration of SARS-CoV-2 viral copies this assay can detect is 138 copies/mL. A negative result does not preclude SARS-Cov-2 infection and should not be used as the sole basis for treatment or other patient management decisions. A negative result may occur with  improper specimen collection/handling, submission of specimen other than nasopharyngeal swab, presence of viral  mutation(s)  within the areas targeted by this assay, and inadequate number of viral copies(<138 copies/mL). A negative result must be combined with clinical observations, patient history, and epidemiological information. The expected result is Negative.  Fact Sheet for Patients:  EntrepreneurPulse.com.au  Fact Sheet for Healthcare Providers:  IncredibleEmployment.be  This test is no t yet approved or cleared by the [place] FDA and  has been authorized for detection and/or diagnosis of SARS-CoV-2 by FDA under an Emergency Use Authorization (EUA). This EUA will remain  in effect (meaning this test can be used) for the duration of the COVID-19 declaration under Section 564(b)(1) of the Act, 21 U.S.C.section 360bbb-3(b)(1), unless the authorization is terminated  or revoked sooner.       Influenza A by PCR NEGATIVE NEGATIVE Final   Influenza B by PCR NEGATIVE NEGATIVE Final    Comment: (NOTE) The Xpert Xpress SARS-CoV-2/FLU/RSV plus assay is intended as an aid in the diagnosis of influenza from Nasopharyngeal swab specimens and should not be used as a sole basis for treatment. Nasal washings and aspirates are unacceptable for Xpert Xpress SARS-CoV-2/FLU/RSV testing.  Fact Sheet for Patients: EntrepreneurPulse.com.au  Fact Sheet for Healthcare Providers: IncredibleEmployment.be  This test is not yet approved or cleared by the [place] FDA and has been authorized for detection and/or diagnosis of SARS-CoV-2 by FDA under an Emergency Use Authorization (EUA). This EUA will remain in effect (meaning this test can be used) for the duration of the COVID-19 declaration under Section 564(b)(1) of the Act, 21 U.S.C. section 360bbb-3(b)(1), unless the authorization is terminated or revoked.  Performed at [facility], [place] [address], [place], [place] [address]   Blood culture (routine x 2)      Status: None (Preliminary result)   Collection Time: 07/12/20  5:25 PM   Specimen: BLOOD  Result Value Ref Range Status   Specimen Description BLOOD LEFT ANTECUBITAL  Final   Special Requests   Final    BOTTLES DRAWN AEROBIC AND ANAEROBIC Blood Culture results may not be optimal due to an excessive volume of blood received in culture bottles   Culture   Final    NO GROWTH 3 DAYS Performed at [facility], [place] [address], [place], [place] [address]    Report Status PENDING  Incomplete  Blood culture (routine x 2)     Status: None (Preliminary result)   Collection Time: 07/12/20  5:45 PM   Specimen: BLOOD  Result Value Ref Range Status   Specimen Description BLOOD SITE NOT SPECIFIED  Final   Special Requests   Final    BOTTLES DRAWN AEROBIC AND ANAEROBIC Blood Culture adequate volume   Culture   Final    NO GROWTH 3 DAYS Performed at [facility], [address] [place], [place] [address]    Report Status PENDING  Incomplete  Culture, Urine     Status: None   Collection Time: 07/13/20 11:17 AM   Specimen: Urine, Random  Result Value Ref Range Status   Specimen Description URINE, RANDOM  Final   Special Requests Immunocompromised  Final   Culture   Final    NO GROWTH Performed at [facility], [place] [address], [place], [place] [address]    Report Status 07/15/2020 FINAL  Final  MRSA PCR Screening     Status: None   Collection Time: 07/13/20  5:07 PM   Specimen: Urine, Unspecified Source; Nasopharyngeal  Result Value Ref Range Status   MRSA by PCR  NEGATIVE NEGATIVE Final    Comment:        The GeneXpert MRSA Assay (FDA approved for NASAL specimens only), is one component of a comprehensive MRSA colonization surveillance program. It is not intended to diagnose MRSA infection nor to guide or monitor treatment for MRSA infections. Performed at [facility], [place] [address], [place], [place] [address]      Radiology Studies: No results  found.  Scheduled Meds: . allopurinol  100 mg Oral BID  . ascorbic acid  500 mg Oral Daily  . aspirin EC  81 mg Oral Q72H  . budesonide  9 mg Oral Q breakfast  . Chlorhexidine Gluconate Cloth  6 each Topical Daily  . citalopram  40 mg Oral Daily  . enoxaparin (LOVENOX) injection  30 mg Subcutaneous Daily  . folic acid  1 mg Oral Daily  . insulin aspart  0-15 Units Subcutaneous TID WC  . insulin aspart  0-5 Units Subcutaneous QHS  . insulin aspart  5 Units Subcutaneous TID WC  . insulin detemir  20 Units Subcutaneous BID  . melatonin  5 mg Oral QHS  . mupirocin ointment  1 application Nasal BID  . polyethylene glycol  17 g Oral Daily  . prednisoLONE acetate  1 drop Right Eye TID AC & HS   Followed by  . [START ON 07/19/2020] prednisoLONE acetate  1 drop Right Eye BID WC  . rosuvastatin  5 mg Oral QHS   Continuous Infusions: . sodium chloride 75 mL/hr at 07/15/20 1452  . ceFEPime (MAXIPIME) IV 2 g (07/14/20 2023)  . metronidazole 500 mg (07/15/20 1502)  . sodium chloride       LOS: 3 days   Marylu Lund, MD [facility] Pager On Amion  If 7PM-7AM, please contact night-coverage 07/15/2020, 4:34 PM

## 2020-07-15 NOTE — Care Management Important Message (Signed)
Important Message  Patient Details  Name: [name] [name] MRN: [id number] Date of Birth: [birth year]   Medicare Important Message Given:  Yes  Due to staffing Patient room called to advise of the IM,  did not get an answer IM document mailed to the patient home address   Orbie Pyo 07/15/2020, 2:47 PM

## 2020-07-15 NOTE — Plan of Care (Signed)

## 2020-07-16 LAB — CBC
HCT: 29.5 % — ABNORMAL LOW (ref 36.0–46.0)
Hemoglobin: 10 g/dL — ABNORMAL LOW (ref 12.0–15.0)
MCH: 29.6 pg (ref 26.0–34.0)
MCHC: 33.9 g/dL (ref 30.0–36.0)
MCV: 87.3 fL (ref 80.0–100.0)
Platelets: 230 10*3/uL (ref 150–400)
RBC: 3.38 MIL/uL — ABNORMAL LOW (ref 3.87–5.11)
RDW: 17.5 % — ABNORMAL HIGH (ref 11.5–15.5)
WBC: 13.3 10*3/uL — ABNORMAL HIGH (ref 4.0–10.5)
nRBC: 0 % (ref 0.0–0.2)

## 2020-07-16 LAB — COMPREHENSIVE METABOLIC PANEL
ALT: 27 U/L (ref 0–44)
AST: 22 U/L (ref 15–41)
Albumin: 2.4 g/dL — ABNORMAL LOW (ref 3.5–5.0)
Alkaline Phosphatase: 39 U/L (ref 38–126)
Anion gap: 11 (ref 5–15)
BUN: 30 mg/dL — ABNORMAL HIGH (ref 8–23)
CO2: 23 mmol/L (ref 22–32)
Calcium: 8.2 mg/dL — ABNORMAL LOW (ref 8.9–10.3)
Chloride: 99 mmol/L (ref 98–111)
Creatinine, Ser: 1.66 mg/dL — ABNORMAL HIGH (ref 0.44–1.00)
GFR, Estimated: 32 mL/min — ABNORMAL LOW (ref >60)
Glucose, Bld: 219 mg/dL — ABNORMAL HIGH (ref 70–99)
Potassium: 3.1 mmol/L — ABNORMAL LOW (ref 3.5–5.1)
Sodium: 133 mmol/L — ABNORMAL LOW (ref 135–145)
Total Bilirubin: 0.4 mg/dL (ref 0.3–1.2)
Total Protein: 5 g/dL — ABNORMAL LOW (ref 6.5–8.1)

## 2020-07-16 LAB — VITAMIN B12: Vitamin B-12: 1380 pg/mL — ABNORMAL HIGH (ref 180–914)

## 2020-07-16 LAB — GLUCOSE, CAPILLARY
Glucose-Capillary: 147 mg/dL — ABNORMAL HIGH (ref 70–99)
Glucose-Capillary: 147 mg/dL — ABNORMAL HIGH (ref 70–99)
Glucose-Capillary: 334 mg/dL — ABNORMAL HIGH (ref 70–99)
Glucose-Capillary: 311 mg/dL — ABNORMAL HIGH (ref 70–99)
Glucose-Capillary: 209 mg/dL — ABNORMAL HIGH (ref 70–99)

## 2020-07-16 LAB — TSH: TSH: 3.249 u[IU]/mL (ref 0.350–4.500)

## 2020-07-16 MED ORDER — ENOXAPARIN SODIUM 40 MG/0.4ML ~~LOC~~ SOLN
40.0000 mg | Freq: Every day | SUBCUTANEOUS | Status: DC
  Administered 2020-07-16 – 2020-07-17 (×2): 40 mg via SUBCUTANEOUS
  Filled 2020-07-16 (×2): qty 0.4

## 2020-07-16 MED ORDER — POTASSIUM CHLORIDE CRYS ER 20 MEQ PO TBCR
40.0000 meq | EXTENDED_RELEASE_TABLET | Freq: Two times a day (BID) | ORAL | Status: AC
  Administered 2020-07-16 (×2): 40 meq via ORAL
  Filled 2020-07-16 (×2): qty 2

## 2020-07-16 MED ORDER — INSULIN ASPART 100 UNIT/ML ~~LOC~~ SOLN
8.0000 [IU] | Freq: Three times a day (TID) | SUBCUTANEOUS | Status: DC
  Administered 2020-07-16: 3 [IU] via SUBCUTANEOUS
  Administered 2020-07-17 (×2): 8 [IU] via SUBCUTANEOUS

## 2020-07-16 MED ORDER — ADULT MULTIVITAMIN W/MINERALS CH
1.0000 | ORAL_TABLET | Freq: Every day | ORAL | Status: DC
  Administered 2020-07-16 – 2020-07-17 (×2): 1 via ORAL
  Filled 2020-07-16 (×2): qty 1

## 2020-07-16 MED ORDER — LOPERAMIDE HCL 2 MG PO CAPS: 2.0000 mg | ORAL_CAPSULE | ORAL | Status: DC | PRN

## 2020-07-16 MED ORDER — ENSURE MAX PROTEIN PO LIQD
11.0000 [oz_av] | Freq: Three times a day (TID) | ORAL | Status: DC
  Administered 2020-07-16 – 2020-07-17 (×4): 11 [oz_av] via ORAL
  Filled 2020-07-16 (×6): qty 330

## 2020-07-16 MED ORDER — PROSOURCE PLUS PO LIQD
30.0000 mL | Freq: Two times a day (BID) | ORAL | Status: DC
  Administered 2020-07-17: 30 mL via ORAL
  Filled 2020-07-16 (×4): qty 30

## 2020-07-16 MED ORDER — LOPERAMIDE HCL 2 MG PO CAPS: 4.0000 mg | ORAL_CAPSULE | Freq: Three times a day (TID) | ORAL | Status: DC | PRN

## 2020-07-16 MED ORDER — ALBUMIN HUMAN 25 % IV SOLN
25.0000 g | Freq: Once | INTRAVENOUS | Status: AC
  Administered 2020-07-16: 25 g via INTRAVENOUS
  Filled 2020-07-16: qty 100

## 2020-07-16 NOTE — Plan of Care (Signed)

## 2020-07-16 NOTE — Progress Notes (Signed)
PROGRESS NOTE    [name] [name]  RCB:[id number] DOB: 1946/06/28 DOA: 07/12/2020 PCP: Binnie Rail, MD    Brief Narrative:  74 y.o. female with history of diabetes mellitus type 2, hypertension, lymphocytic colitis, psoriatic and rheumatoid arthritis on immunosuppressants, sleep apnea previous history of DVT presents to the ER after patient was feeling weak and dizzy with persistent nausea with some vomiting unable to eat well since Thanksgiving almost a week now.  Denies any fever chills.  Denies chest pain or shortness of breath.  Denies diarrhea.  Has had some abdominal discomfort in the left lower quadrant which has improved.   Pt was found to be hypotensive on presentation despite aggressive IVF hydration ultimately requiring pressor support  Assessment & Plan:   Principal Problem:   ARF (acute renal failure) (HCC) Active Problems:   OSA (obstructive sleep apnea), BiPAP   Essential hypertension   DM2 (diabetes mellitus, type 2) (HCC)   Psoriasis   Diabetic foot ulcer ([place])   Deep venous thrombosis of lower extremity (HCC)   Lymphocytic-plasmacytic colitis   Increased anion gap metabolic acidosis   Shock ([place])  1. Acute renal failure likely from dehydration and poor oral intake.   1. Presenting Cr noted to be elevated at 3.21, peaking to 3.67 2. Still clinically dehydrated, albeit improved 3. Cr did improve with IVF, Cr down to 1.66 this AM 4. Discussed with GI, recommendation to continue IVF for now 5. Repeat cmp in AM 6. Cont to avoid nephrotoxic agents 2. Nausea and vomiting: 1. Unclear etiology for N/V 2. Initially noted to be constipated prior to visit, however, now with ample stool after trial of miralax 3. Appreciate input by GI, recommendation to hold off on further miralax given hx of lymphocytic colitis 4. Continue on PRN imodium to control bowel function instead 5. Per GI, consideration for possible motility study as outpatient 3. History of hypertension   1. Given ARF, currently holding outpatient lisinopril, hydrochlorothiazide 2. Continue on PRN hydralazine 3. Now off stress steroids 4. BP remains stable at this time 4. Lactic acidosis and hypotension likely from dehydration.  1. Overall improved, but still remains clinically dehydrated 2. Discussed with GI, recommendation to continue gentle IVF as tolerated 5. Chronic wound of the left foot  1. Most recent xray from 11/29 reviewed with fracture through distal 3rd metatarsal with overlying soft tissue swelling 2. Seen by WOC, appreciate input 3. Also seen by therapy. Thus far, recs for HHPT/OT 6. Sleep apnea  1. Continue on BiPAP as tolerated 7. Diabetes mellitus type 2 uncontrolled  1. A1c of 7.9 2. Glucose suboptimally controlled, likely made worse by recent stress dosed steroids, see below 3. Steroids have been d/c'd 4. Have increased basal insulin to 20 units BID with meal coverage per Diabetic Coordinator recommendations 5. Cont SSI coverage 6. Glucose trends were much better this AM, however 334 glucose noted later in the day 7. Will increase meal coverage to 8 units 8. History of lymphocytic colitis  1. Continue on [place]. 2. CT without bowel wall thickening or inflammatory change 9. History of psoriatic and rheumatoid arthritis  1. Pt has been continued on methotrexate and also infusions of immune therapy. 2. Seems to be stable at this time 10. Chronic Anemia  1. Remains hemodynamically stable without evidence of blood loss 2. Recheck CBC in AM 11. History of gout on allopurinol.   1. Seems to be stable at this time 12. History of Covid infection  1. Chart reviewed. Pt  was admitted in December 2020 for COVID pna 2. Stable and remains on minimal O2 support at this time 13. Hypovolemic shock 1. Resolved 2. See above. Pt presented markedly hypovolemic and hypotensive, requiring pressor support 3. Pressors and now stress steroids have been discontinued 4. BP is  currently stable at this time  DVT prophylaxis: Lovenox subq Code Status: Full Family Communication: Pt in room, family currently not at bedside  Status is: Inpatient  Remains inpatient appropriate because:Unsafe d/c plan, IV treatments appropriate due to intensity of illness or inability to take PO and Inpatient level of care appropriate due to severity of illness   Dispo: The patient is from: Home              Anticipated d/c is to: Home              Anticipated d/c date is: 1 day              Patient currently is not medically stable to d/c.   Consultants:   GI  Procedures:     Antimicrobials: Anti-infectives (From admission, onward)   Start     Dose/Rate Route Frequency Ordered Stop   07/13/20 0200  ceFEPIme (MAXIPIME) 2 g in sodium chloride 0.9 % 100 mL IVPB  Status:  Discontinued        2 g 200 mL/hr over 30 Minutes Intravenous Daily at bedtime 07/12/20 2251 07/15/20 1716   07/12/20 2359  vancomycin (VANCOCIN) IVPB 1000 mg/200 mL premix        1,000 mg 200 mL/hr over 60 Minutes Intravenous  Once 07/12/20 2251 07/13/20 0143   07/12/20 2309  vancomycin variable dose per unstable renal function (pharmacist dosing)  Status:  Discontinued         Does not apply See admin instructions 07/12/20 2309 07/14/20 1054   07/12/20 2300  metroNIDAZOLE (FLAGYL) IVPB 500 mg  Status:  Discontinued        500 mg 100 mL/hr over 60 Minutes Intravenous Every 8 hours 07/12/20 2251 07/15/20 1716   07/12/20 1830  piperacillin-tazobactam (ZOSYN) IVPB 3.375 g        3.375 g 100 mL/hr over 30 Minutes Intravenous  Once 07/12/20 1817 07/12/20 2119   07/12/20 1830  vancomycin (VANCOCIN) IVPB 1000 mg/200 mL premix        1,000 mg 200 mL/hr over 60 Minutes Intravenous  Once 07/12/20 1817 07/12/20 2352      Subjective: Reports feeing overall better but reports now having multiple loose stools that started yesterday  Objective: Vitals:   07/16/20 0300 07/16/20 0640 07/16/20 0851 07/16/20 1143   BP: 132/68 (!) 143/67 (!) 158/75 (!) 147/69  Pulse: 73 88 86 93  Resp: 14 19 13 17   Temp: 97.6 F (36.4 C) 98 F (36.7 C) 98 F (36.7 C) 98.5 F (36.9 C)  TempSrc: Axillary Oral Oral Oral  SpO2: 98% 95% 99% 96%  Weight:      Height:        Intake/Output Summary (Last 24 hours) at 07/16/2020 1317 Last data filed at 07/16/2020 1145 Gross per 24 hour  Intake 2130.32 ml  Output 1275 ml  Net 855.32 ml   Filed Weights   07/12/20 1130  Weight: 112.9 kg    Examination: General exam: Conversant, in no acute distress Respiratory system: normal chest rise, clear, no audible wheezing Cardiovascular system: regular rhythm, s1-s2 Gastrointestinal system: Nondistended, nontender, pos BS Central nervous system: No seizures, no tremors Extremities: No cyanosis, no joint deformities  Skin: No rashes, no pallor Psychiatry: Affect normal // no auditory hallucinations   Data Reviewed: I have personally reviewed following labs and imaging studies  CBC: Recent Labs  Lab 07/13/20 0511 07/13/20 2212 07/14/20 0606 07/15/20 0259 07/16/20 0155  WBC 12.4* 11.5* 13.8* 13.5* 13.3*  NEUTROABS 9.2*  --   --   --   --   HGB 9.7* 9.7* 9.9* 9.7* 10.0*  HCT 30.6* 28.5* 30.8* 29.8* 29.5*  MCV 90.8 87.2 89.3 88.4 87.3  PLT 236 213 222 217 329   Basic Metabolic Panel: Recent Labs  Lab 07/13/20 0511 07/14/20 0042 07/14/20 0606 07/15/20 0259 07/16/20 0155  NA 128* 128* 132* 130* 133*  K 4.5 4.1 4.1 3.7 3.1*  CL 93* 95* 97* 95* 99  CO2 23 22 22 24 23   GLUCOSE 174* 416* 303* 325* 219*  BUN 27* 26* 26* 34* 30*  CREATININE 3.13* 2.64* 2.42* 2.16* 1.66*  CALCIUM 8.4* 8.2* 8.3* 8.1* 8.2*   GFR: Estimated Creatinine Clearance: 37.9 mL/min (A) (by C-G formula based on SCr of 1.66 mg/dL (H)). Liver Function Tests: Recent Labs  Lab 07/13/20 0511 07/15/20 0259 07/16/20 0155  AST 37 25 22  ALT 40 31 27  ALKPHOS 42 42 39  BILITOT 0.8 0.6 0.4  PROT 4.7* 5.1* 5.0*  ALBUMIN 2.3* 2.3* 2.4*    Recent Labs  Lab 07/13/20 1155  LIPASE 19   No results for input(s): AMMONIA in the last 168 hours. Coagulation Profile: No results for input(s): INR, PROTIME in the last 168 hours. Cardiac Enzymes: No results for input(s): CKTOTAL, CKMB, CKMBINDEX, TROPONINI in the last 168 hours. BNP (last 3 results) No results for input(s): PROBNP in the last 8760 hours. HbA1C: No results for input(s): HGBA1C in the last 72 hours. CBG: Recent Labs  Lab 07/15/20 1540 07/15/20 2024 07/16/20 0628 07/16/20 0853 07/16/20 1142  GLUCAP 321* 286* 147* 147* 334*   Lipid Profile: No results for input(s): CHOL, HDL, LDLCALC, TRIG, CHOLHDL, LDLDIRECT in the last 72 hours. Thyroid Function Tests: Recent Labs    07/16/20 0155  TSH 3.249   Anemia Panel: Recent Labs    07/16/20 0155  VITAMINB12 1,380*   Sepsis Labs: Recent Labs  Lab 07/12/20 1725 07/12/20 1938 07/12/20 2226 07/12/20 2230 07/13/20 0511  PROCALCITON  --   --   --  0.33  --   LATICACIDVEN 5.8* 4.5* 2.9*  --  2.6*    Recent Results (from the past 240 hour(s))  Resp Panel by RT-PCR (Flu A&B, Covid) Nasopharyngeal Swab     Status: None   Collection Time: 07/12/20  5:00 PM   Specimen: Nasopharyngeal Swab; Nasopharyngeal(NP) swabs in vial transport medium  Result Value Ref Range Status   SARS Coronavirus 2 by RT PCR NEGATIVE NEGATIVE Final    Comment: (NOTE) SARS-CoV-2 target nucleic acids are NOT DETECTED.  The SARS-CoV-2 RNA is generally detectable in upper respiratory specimens during the acute phase of infection. The lowest concentration of SARS-CoV-2 viral copies this assay can detect is 138 copies/mL. A negative result does not preclude SARS-Cov-2 infection and should not be used as the sole basis for treatment or other patient management decisions. A negative result may occur with  improper specimen collection/handling, submission of specimen other than nasopharyngeal swab, presence of viral mutation(s) within  the areas targeted by this assay, and inadequate number of viral copies(<138 copies/mL). A negative result must be combined with clinical observations, patient history, and epidemiological information. The expected result is Negative.  Fact Sheet for Patients:  EntrepreneurPulse.com.au  Fact Sheet for Healthcare Providers:  IncredibleEmployment.be  This test is no t yet approved or cleared by the [place] FDA and  has been authorized for detection and/or diagnosis of SARS-CoV-2 by FDA under an Emergency Use Authorization (EUA). This EUA will remain  in effect (meaning this test can be used) for the duration of the COVID-19 declaration under Section 564(b)(1) of the Act, 21 U.S.C.section 360bbb-3(b)(1), unless the authorization is terminated  or revoked sooner.       Influenza A by PCR NEGATIVE NEGATIVE Final   Influenza B by PCR NEGATIVE NEGATIVE Final    Comment: (NOTE) The Xpert Xpress SARS-CoV-2/FLU/RSV plus assay is intended as an aid in the diagnosis of influenza from Nasopharyngeal swab specimens and should not be used as a sole basis for treatment. Nasal washings and aspirates are unacceptable for Xpert Xpress SARS-CoV-2/FLU/RSV testing.  Fact Sheet for Patients: EntrepreneurPulse.com.au  Fact Sheet for Healthcare Providers: IncredibleEmployment.be  This test is not yet approved or cleared by the [place] FDA and has been authorized for detection and/or diagnosis of SARS-CoV-2 by FDA under an Emergency Use Authorization (EUA). This EUA will remain in effect (meaning this test can be used) for the duration of the COVID-19 declaration under Section 564(b)(1) of the Act, 21 U.S.C. section 360bbb-3(b)(1), unless the authorization is terminated or revoked.  Performed at [facility], [place] [address] [place], [place] [address]   Blood culture (routine x 2)     Status: None  (Preliminary result)   Collection Time: 07/12/20  5:25 PM   Specimen: BLOOD  Result Value Ref Range Status   Specimen Description BLOOD LEFT ANTECUBITAL  Final   Special Requests   Final    BOTTLES DRAWN AEROBIC AND ANAEROBIC Blood Culture results may not be optimal due to an excessive volume of blood received in culture bottles   Culture   Final    NO GROWTH 4 DAYS Performed at [facility], [place] [address] [place], [place] [address]    Report Status PENDING  Incomplete  Blood culture (routine x 2)     Status: None (Preliminary result)   Collection Time: 07/12/20  5:45 PM   Specimen: BLOOD  Result Value Ref Range Status   Specimen Description BLOOD SITE NOT SPECIFIED  Final   Special Requests   Final    BOTTLES DRAWN AEROBIC AND ANAEROBIC Blood Culture adequate volume   Culture   Final    NO GROWTH 4 DAYS Performed at [facility], [address] [place], [place] [address]    Report Status PENDING  Incomplete  Culture, Urine     Status: None   Collection Time: 07/13/20 11:17 AM   Specimen: Urine, Random  Result Value Ref Range Status   Specimen Description URINE, RANDOM  Final   Special Requests Immunocompromised  Final   Culture   Final    NO GROWTH Performed at [facility], [place] [address] [place], [place] [address]    Report Status 07/15/2020 FINAL  Final  MRSA PCR Screening     Status: None   Collection Time: 07/13/20  5:07 PM   Specimen: Urine, Unspecified Source; Nasopharyngeal  Result Value Ref Range Status   MRSA by PCR NEGATIVE NEGATIVE Final    Comment:        The GeneXpert MRSA Assay (FDA approved for NASAL specimens only), is one component of a comprehensive MRSA colonization surveillance program.  It is not intended to diagnose MRSA infection nor to guide or monitor treatment for MRSA infections. Performed at [facility], [place] [address] [place], [place] [address]      Radiology Studies: No results found.  Scheduled  Meds: . (feeding supplement) PROSource Plus  30 mL Oral BID BM  . allopurinol  100 mg Oral BID  . ascorbic acid  500 mg Oral Daily  . aspirin EC  81 mg Oral Q72H  . budesonide  9 mg Oral Q breakfast  . Chlorhexidine Gluconate Cloth  6 each Topical Daily  . citalopram  40 mg Oral Daily  . enoxaparin (LOVENOX) injection  40 mg Subcutaneous Daily  . folic acid  1 mg Oral Daily  . insulin aspart  0-15 Units Subcutaneous TID WC  . insulin aspart  0-5 Units Subcutaneous QHS  . insulin aspart  5 Units Subcutaneous TID WC  . insulin detemir  20 Units Subcutaneous BID  . melatonin  5 mg Oral QHS  . multivitamin with minerals  1 tablet Oral Daily  . mupirocin ointment  1 application Nasal BID  . polyethylene glycol  17 g Oral Daily  . potassium chloride  40 mEq Oral BID  . prednisoLONE acetate  1 drop Right Eye TID AC & HS   Followed by  . [START ON 07/19/2020] prednisoLONE acetate  1 drop Right Eye BID WC  . Ensure Max Protein  11 oz Oral TID  . rosuvastatin  5 mg Oral QHS   Continuous Infusions: . sodium chloride 100 mL/hr at 07/15/20 1731  . sodium chloride       LOS: 4 days   Marylu Lund, MD [facility] Pager On Amion  If 7PM-7AM, please contact night-coverage 07/16/2020, 1:17 PM

## 2020-07-16 NOTE — Progress Notes (Signed)
Initial Nutrition Assessment  DOCUMENTATION CODES:   Obesity unspecified  INTERVENTION:   Recommend diet liberalization to encourage po intake  Ensure Max po TID, each supplement provides 150 kcal and 30 grams of protein  Magic cup TID with meals, each supplement provides 290 kcal and 9 grams of protein  50ml Prosource Plus po BID, each supplement provides 100 kcals and 15 grams of protein  MVI with minerals daily   NUTRITION DIAGNOSIS:   Inadequate oral intake related to poor appetite as evidenced by per patient/family report.    GOAL:   Patient will meet greater than or equal to 90% of their needs    MONITOR:   PO intake, Supplement acceptance, Skin, Weight trends, Labs, I & O's  REASON FOR ASSESSMENT:   Consult Assessment of nutrition requirement/status  ASSESSMENT:   Pt admitted with ARF likely 2/2 dehydration and poor oral intake. PMH includes type 2 DM, HTN, lymphocytic colitis, psoriatic and rheumatoid arthritis, and h/o of COVID-19 infection Dec 2020.  Pt reports feeling weak and dizzy and states that she has had poor po intake due to persistent nausea with some vomiting since shortly after Thanksgiving. Pt states these symptoms have been occurring for ~ 1 week.   No PO intake documented.   Reviewed weight history. No significant weight changes noted. Pt now weighs 112.9 kg and is noted to have weighed 111.1 kg in June and August 2021. However, per RN assessment, pt now with 3+ deep pitting generalized edema, which is likely masking any weight loss.   Will order oral nutrition supplements to help pt meets kcal/protein needs. Additionally, discussed pt with MD and will liberalize pt's diet to carb modified to encourage po intake (pt was on heart healthy/carb modified diet).  Per WOC, pt with neuropathic ulcer to left plantar and medial foot  UOP: 1342ml x24 hours   Labs: Na 131 (L), K+ 3.1 (L), Cr 1.66 - down from yesterday, CBGs 147-286 (Diabetes  Coordinator following) Medications: vitamin c, folvite, ss novolog TID w/ meals and bedtime, 5 units novolog TID w/ meals, 20 units levemir BID, miralax, klor-con, pred forte    NUTRITION - FOCUSED PHYSICAL EXAM:  Unable to perform at this time; will attempt at follow-up.   Diet Order:   Diet Order            Diet Carb Modified Fluid consistency: Thin; Room service appropriate? Yes  Diet effective now                 EDUCATION NEEDS:   No education needs have been identified at this time  Skin:  Skin Assessment: Skin Integrity Issues: Skin Integrity Issues:: Other (Comment) Other: Neuropathic ulcer to left plantar and medial foot  Last BM:  12/5  Height:   Ht Readings from Last 1 Encounters:  07/12/20 5\' 6"  (1.676 m)    Weight:   Wt Readings from Last 1 Encounters:  07/12/20 112.9 kg    BMI:  Body mass index is 40.19 kg/m.  Estimated Nutritional Needs:   Kcal:  2150-2350  Protein:  135-145 grams  Fluid:  >2L/d    Larkin Ina, MS, RD, LDN RD pager number and weekend/on-call pager number located in [place].

## 2020-07-16 NOTE — Plan of Care (Signed)

## 2020-07-16 NOTE — Consult Note (Signed)
[facility] Consult  Referring Provider: Dr.Chiu/[facility] Primary Care Physician:  Binnie Rail, MD Primary Gastroenterologist: Dr.Buccini  Reason for Consultation: Management of lymphocytic colitis With HPI: [name] [name] is a 74 y.o. female was admitted on 07/12/2020 with nausea, loss of appetite, dry heaving for 8 days prior to admission.  She initially went to the urgent care and was later admitted by ER when she was found to be hypotensive, along with elevated lactic acid, impaired renal function. In the ER she was given 4-1/2 L of IV fluids, briefly started on insulin drip, started on empiric antibiotics Vancomycin/cefepime/Flagyl. She was briefly started on IV pressors, transferred out of ICU.  She has a longstanding history of lymphocytic colitis, diagnosed in 2008, has been on budesonide 9 mg daily along with Imodium 2 pills 3 times a day. Without the medications for lymphocytic colitis, patient reports multiple episodes of large-volume, liquid, watery stools and explosive diarrhea. During the 7 to 8 days when patient was nauseous and had a decreased appetite, she was not eating well and states she may have had 1 bowel movement.  However, yesterday and today patient has had 2 loose large explosive bowel movements already and is concerned about worsening symptoms from lymphocytic colitis(patient states at one point she required 21 pills of Imodium in 1 day).  Since admission, there has been improvement in nausea, patient is now able to tolerate regular diet.  She denies abdominal pain. She has acid reflux and heartburn, minimal symptoms currently, takes omeprazole at home. She has intermittent dysphagia, especially with large pills, occasionally with solids, never with liquids and this is not associated with unintentional weight loss.  Past GI history: Colonoscopy from 06/2014: Removal of tubular adenoma, random biopsies compatible with lymphocytic  colitis Colonoscopy from 11/2009: Within normal limits Patient was recommended to repeat colonoscopy in 11/23 She was advised to continue Uceris indefinitely   Past Medical History:  Diagnosis Date  . Arthritis   . Closed fracture of fifth metatarsal bone 05/13/2018  . Deep vein thrombosis ([place])    right calf - 05/2012   . Diabetes mellitus without complication (HCC)    diet controlled   . GERD (gastroesophageal reflux disease)   . Hyperlipidemia   . Hypertension    Ejection fraction =>55% Left ventricular systolic function is normal. Left ventricular wall motion is normal    . Lymphocytic colitis   . Neuropathy    diabetic - in bilateral feet   . Pityriasis lichenoides chronica   . Sleep apnea    bipap    Past Surgical History:  Procedure Laterality Date  . DILATION AND CURETTAGE OF UTERUS    . EYE SURGERY  07/2020  . HAMMER TOE SURGERY    . right hand surgery      due to blood infection   . torn meniscus repair      right knee   . TOTAL HIP ARTHROPLASTY  09/13/2012   Procedure: TOTAL HIP ARTHROPLASTY ANTERIOR APPROACH;  Surgeon: Mauri Pole, MD;  Location: [facility];  Service: Orthopedics;  Laterality: Right;    Prior to Admission medications   Medication Sig Start Date End Date Taking? Authorizing Provider  acetaminophen (TYLENOL) 325 MG tablet Take 2 tablets (650 mg total) by mouth every 6 (six) hours as needed for mild pain or headache. 07/13/19  Yes Elgergawy, Silver Huguenin, MD  allopurinol (ZYLOPRIM) 100 MG tablet Take 1 tablet (100 mg total) by mouth 2 (two) times daily. 04/21/20  Yes Burns,  Claudina Lick, MD  ALPRAZolam Duanne Moron) 0.25 MG tablet Take 1 tablet (0.25 mg total) by mouth 2 (two) times daily as needed for anxiety. 11/13/19  Yes Burns, Claudina Lick, MD  ascorbic acid (VITAMIN C) 500 MG tablet Take 500 mg by mouth at bedtime.   Yes [provider]  aspirin 81 MG tablet Take 81 mg by mouth See admin instructions. Every 3 days   Yes [provider]  azelastine  (ASTELIN) 0.1 % nasal spray Place 2 sprays into both nostrils 2 (two) times daily. Use in each nostril as directed Patient taking differently: Place 2 sprays into both nostrils at bedtime. Use in each nostril as directed 12/26/19  Yes Burns, Claudina Lick, MD  Budesonide (UCERIS) 9 MG TB24 Take 9 mg by mouth daily.    Yes [provider]  Certolizumab Pegol (CIMZIA Aubrey) Inject 400 mg into the skin every 30 (thirty) days. 200 mg on each side of stomach   Yes [provider]  Cholecalciferol (VITAMIN D3) 1000 UNITS CAPS Take 1,000 Units by mouth 2 (two) times daily.    Yes [provider]  citalopram (CELEXA) 40 MG tablet TAKE 1 TABLET BY MOUTH EVERY DAY Patient taking differently: Take 40 mg by mouth daily.  04/22/20  Yes Burns, Claudina Lick, MD  Cyanocobalamin (VITAMIN B-12) 1000 MCG SUBL Place 1,000 mcg under the tongue daily.   Yes [provider]  cyclobenzaprine (FLEXERIL) 10 MG tablet TAKE 1 TABLET BY MOUTH AT BEDTIME Patient taking differently: Take 10 mg by mouth at bedtime.  06/17/20  Yes Burns, Claudina Lick, MD  docusate sodium (COLACE) 100 MG capsule Take 1 capsule (100 mg total) by mouth 2 (two) times daily. 08/30/19  Yes Burns, Claudina Lick, MD  folic acid (FOLVITE) 1 MG tablet TAKE 1 TABLET(1 MG) BY MOUTH DAILY Patient taking differently: Take 1 mg by mouth daily.  04/22/20  Yes Burns, Claudina Lick, MD  Insulin Pen Needle (B-D UF III MINI PEN NEEDLES) 31G X 5 MM MISC Use daily for insulin pen 01/23/20  Yes Burns, Claudina Lick, MD  LEVEMIR FLEXTOUCH 100 UNIT/ML FlexPen ADMINISTER 20 UNITS UNDER THE SKIN TWICE DAILY Patient taking differently: Inject 20 Units into the skin 2 (two) times daily.  04/05/20  Yes Burns, Claudina Lick, MD  lisinopril-hydrochlorothiazide (ZESTORETIC) 20-25 MG tablet Take 1 tablet by mouth daily. 05/02/20  Yes Burns, Claudina Lick, MD  loperamide (IMODIUM) 2 MG capsule Take 6 mg by mouth 2 (two) times daily.   Yes [provider]  Melatonin 5 MG SUBL Place 5 mg under  the tongue at bedtime.   Yes [provider]  metFORMIN (GLUCOPHAGE-XR) 500 MG 24 hr tablet Take 2 tablets (1,000 mg total) by mouth 2 (two) times daily before a meal. 06/19/20  Yes Burns, Claudina Lick, MD  methotrexate (RHEUMATREX) 2.5 MG tablet Take 20 mg by mouth every Friday.  05/10/20  Yes [provider]  naftifine (NAFTIN) 1 % cream Apply 1 application topically daily as needed (rash).  06/27/20  Yes [provider]  omeprazole (PRILOSEC) 40 MG capsule Take 40 mg by mouth at bedtime.   Yes [provider]  potassium chloride (KLOR-CON) 10 MEQ tablet TAKE 2 TABLETS(20 MEQ) BY MOUTH DAILY Patient taking differently: Take 40 mEq by mouth daily.  04/22/20  Yes Burns, Claudina Lick, MD  prednisoLONE acetate (PRED FORTE) 1 % ophthalmic suspension Place 4 drops into the right eye See admin instructions. 4 drops  daily for 1 week,  2 drops daily until post op. Surgery 07/11/20 07/11/20  Yes [provider]  Probiotic Product (ALIGN PO) Take 1 capsule by mouth daily.   Yes [provider]  rosuvastatin (CRESTOR) 5 MG tablet Take 1 tablet (5 mg total) by mouth daily. Patient taking differently: Take 5 mg by mouth at bedtime.  04/05/20  Yes Burns, Claudina Lick, MD  triamcinolone cream (KENALOG) 0.1 % Apply 1 application topically 2 (two) times daily. Patient taking differently: Apply 1 application topically 2 (two) times daily as needed (rash).  09/18/19  Yes Burns, Claudina Lick, MD  cephALEXin (KEFLEX) 500 MG capsule Take 1 capsule (500 mg total) by mouth 2 (two) times daily. Patient not taking: Reported on 07/12/2020 06/08/20   Binnie Rail, MD  ciprofloxacin (CIPRO) 250 MG tablet Take 1 tablet (250 mg total) by mouth 2 (two) times daily. Patient not taking: Reported on 07/12/2020 06/23/20   Binnie Rail, MD    Current Facility-Administered Medications  Medication Dose Route Frequency Provider Last Rate Last Admin  . (feeding supplement) PROSource Plus liquid 30 mL  30  mL Oral BID BM Donne Hazel, MD      . 0.9 %  sodium chloride infusion   Intravenous Continuous Donne Hazel, MD 100 mL/hr at 07/15/20 1731 Rate Change at 07/15/20 1731  . acetaminophen (TYLENOL) tablet 650 mg  650 mg Oral Q6H PRN Rise Patience, MD   650 mg at 07/15/20 2239   Or  . acetaminophen (TYLENOL) suppository 650 mg  650 mg Rectal Q6H PRN Rise Patience, MD      . allopurinol (ZYLOPRIM) tablet 100 mg  100 mg Oral BID Rise Patience, MD   100 mg at 07/16/20 1011  . ALPRAZolam Duanne Moron) tablet 0.25 mg  0.25 mg Oral BID PRN Rise Patience, MD   0.25 mg at 07/15/20 2232  . ascorbic acid (VITAMIN C) tablet 500 mg  500 mg Oral Daily Rise Patience, MD   500 mg at 07/16/20 1011  . aspirin EC tablet 81 mg  81 mg Oral Q72H Rise Patience, MD   81 mg at 07/16/20 1011  . budesonide (ENTOCORT EC) 24 hr capsule 9 mg  9 mg Oral Q breakfast Rise Patience, MD   9 mg at 07/16/20 0641  . Chlorhexidine Gluconate Cloth 2 % PADS 6 each  6 each Topical Daily Brand Males, MD   6 each at 07/16/20 1233  . citalopram (CELEXA) tablet 40 mg  40 mg Oral Daily Rise Patience, MD   40 mg at 07/16/20 1011  . docusate sodium (COLACE) capsule 100 mg  100 mg Oral BID PRN Maryjane Hurter, MD      . enoxaparin (LOVENOX) injection 40 mg  40 mg Subcutaneous Daily Bertis Ruddy, RPH   40 mg at 07/16/20 1016  . folic acid (FOLVITE) tablet 1 mg  1 mg Oral Daily Rise Patience, MD   1 mg at 07/16/20 1011  . insulin aspart (novoLOG) injection 0-15 Units  0-15 Units Subcutaneous TID WC Donne Hazel, MD   11 Units at 07/16/20 1212  . insulin aspart (novoLOG) injection 0-5 Units  0-5 Units Subcutaneous QHS Donne Hazel, MD   3 Units at 07/15/20 2031  . insulin aspart (novoLOG) injection 5 Units  5 Units Subcutaneous TID WC Donne Hazel, MD   5 Units at 07/16/20 1214  . insulin detemir (LEVEMIR) injection 20 Units  20 Units Subcutaneous  BID Donne Hazel,  MD   20 Units at 07/16/20 1018  . loperamide (IMODIUM) capsule 2 mg  2 mg Oral PRN Donne Hazel, MD      . melatonin tablet 5 mg  5 mg Oral QHS Rise Patience, MD   5 mg at 07/15/20 2232  . multivitamin with minerals tablet 1 tablet  1 tablet Oral Daily Donne Hazel, MD   1 tablet at 07/16/20 1011  . mupirocin ointment (BACTROBAN) 2 % 1 application  1 application Nasal BID Brand Males, MD   1 application at 30/16/01 1022  . potassium chloride SA (KLOR-CON) CR tablet 40 mEq  40 mEq Oral BID Donne Hazel, MD   40 mEq at 07/16/20 1011  . prednisoLONE acetate (PRED FORTE) 1 % ophthalmic suspension 1 drop  1 drop Right Eye TID AC & HS Rise Patience, MD   1 drop at 07/16/20 1212   Followed by  . [START ON 07/19/2020] prednisoLONE acetate (PRED FORTE) 1 % ophthalmic suspension 1 drop  1 drop Right Eye BID WC Rise Patience, MD   1 drop at 07/13/20 2013  . protein supplement (ENSURE MAX) liquid  11 oz Oral TID Donne Hazel, MD   11 oz at 07/16/20 1022  . rosuvastatin (CRESTOR) tablet 5 mg  5 mg Oral QHS Rise Patience, MD   5 mg at 07/15/20 2032  . sodium chloride 0.9 % bolus 1,000 mL  1,000 mL Intravenous Once Lajean Saver, MD        Allergies as of 07/12/2020 - Review Complete 07/12/2020  Allergen Reaction Noted  . Other Shortness Of Breath 10/06/2014  . Bee venom Swelling 05/01/2018  . Farxiga [dapagliflozin]  10/24/2019  . Sulfa antibiotics Hives 09/06/2012    Family History  Problem Relation Age of Onset  . Hypertension Mother     Social History   Socioeconomic History  . Marital status: Divorced    Spouse name: Not on file  . Number of children: Not on file  . Years of education: Not on file  . Highest education level: Not on file  Occupational History  . Not on file  Tobacco Use  . Smoking status: Never Smoker  . Smokeless tobacco: Never Used  Substance and Sexual Activity  . Alcohol use: Yes    Comment: occasional wine   . Drug  use: No  . Sexual activity: Not on file  Other Topics Concern  . Not on file  Social History Narrative   She is widowed. She has 2 children, 2 grandchildren. She does drink occasional alcohol . There is no tobacco . She does not have a routine exercise program   Social Determinants of Health   Financial Resource Strain: Low Risk   . Difficulty of Paying Living Expenses: Not hard at all  Food Insecurity: No Food Insecurity  . Worried About Charity fundraiser in the Last Year: Never true  . Ran Out of Food in the Last Year: Never true  Transportation Needs: No Transportation Needs  . Lack of Transportation (Medical): No  . Lack of Transportation (Non-Medical): No  Physical Activity: Inactive  . Days of Exercise per Week: 0 days  . Minutes of Exercise per Session: 0 min  Stress: No Stress Concern Present  . Feeling of Stress : Not at all  Social Connections: Unknown  . Frequency of Communication with Friends and Family: More than three times a week  . Frequency of  Social Gatherings with Friends and Family: Once a week  . Attends Religious Services: Patient refused  . Active Member of Clubs or Organizations: Patient refused  . Attends Archivist Meetings: Patient refused  . Marital Status: Widowed  Intimate Partner Violence:   . Fear of Current or Ex-Partner: Not on file  . Emotionally Abused: Not on file  . Physically Abused: Not on file  . Sexually Abused: Not on file    Review of Systems: Positive for: GI: Described in detail in HPI.    Gen: anorexia, fatigue, weakness, malaise, denies any fever, chills, rigors, night sweats,  involuntary weight loss, and sleep disorder CV: Denies chest pain, angina, palpitations, syncope, orthopnea, PND, peripheral edema, and claudication. Resp: Denies dyspnea, cough, sputum, wheezing, coughing up blood. GU : Denies urinary burning, blood in urine, urinary frequency, urinary hesitancy, nocturnal urination, and urinary  incontinence. MS: Denies joint pain or swelling.  Denies muscle weakness, cramps, atrophy.  Derm: Denies rash, itching, oral ulcerations, hives, unhealing ulcers.  Psych: Denies depression, anxiety, memory loss, suicidal ideation, hallucinations,  and confusion. Heme: Denies bruising, bleeding, and enlarged lymph nodes. Neuro:  Denies any headaches, dizziness, paresthesias. Endo:  DM,  denies any problems with thyroid, adrenal function.  Physical Exam: Vital signs in last 24 hours: Temp:  [97.6 F (36.4 C)-98.5 F (36.9 C)] 98.5 F (36.9 C) (12/07 1143) Pulse Rate:  [73-93] 93 (12/07 1143) Resp:  [13-19] 17 (12/07 1143) BP: (132-158)/(67-75) 147/69 (12/07 1143) SpO2:  [95 %-99 %] 96 % (12/07 1143) Last BM Date: 07/14/20  General:   Alert,  Well-developed, obese, pleasant and cooperative in NAD Head:  Normocephalic and atraumatic.  Superficial dilated veins over both cheeks Eyes:  Sclera clear, no icterus.   Mild pallor Ears:  Normal auditory acuity. Nose:  No deformity, discharge,  or lesions. Mouth: Dry oral mucosa and tongue Neck:  Supple; no masses or thyromegaly. Lungs:  Clear throughout to auscultation.   No wheezes, crackles, or rhonchi. No acute distress. Heart: Slight tachycardia Extremities: Left lower extremity cast in place, edema noted over right lower extremity Neurologic:  Alert and  oriented x4;  grossly normal neurologically. Skin:  Intact without significant lesions or rashes. Psych:  Alert and cooperative. Normal mood and affect. Abdomen:  Soft, nontender and nondistended. No masses, hepatosplenomegaly or hernias noted.  Hyperactive bowel sounds    Lab Results: Recent Labs    07/14/20 0606 07/15/20 0259 07/16/20 0155  WBC 13.8* 13.5* 13.3*  HGB 9.9* 9.7* 10.0*  HCT 30.8* 29.8* 29.5*  PLT 222 217 230   BMET Recent Labs    07/14/20 0606 07/15/20 0259 07/16/20 0155  NA 132* 130* 133*  K 4.1 3.7 3.1*  CL 97* 95* 99  CO2 22 24 23   GLUCOSE 303*  325* 219*  BUN 26* 34* 30*  CREATININE 2.42* 2.16* 1.66*  CALCIUM 8.3* 8.1* 8.2*   LFT Recent Labs    07/16/20 0155  PROT 5.0*  ALBUMIN 2.4*  AST 22  ALT 27  ALKPHOS 39  BILITOT 0.4   PT/INR No results for input(s): LABPROT, INR in the last 72 hours.  Studies/Results: No results found.  Impression: History of lymphocytic colitis, on budesonide 9 mg daily and Imodium 2 pills 3 times a day on a regular basis  Nausea, dry heaving, lack of appetite, leading to dehydration, electrolyte impairment, acidosis and impaired renal function: Improving  Mild leukocytosis, normocytic anemia CT abdomen and pelvis without contrast from 07/12/2020 reviewed, mild diverticulosis,  no acute abnormality  Intermittent, longstanding history of dysphagia  Multiple comorbidities: Psoriatic arthritis on methotrexate(takes 8 pills of 2.5 mg every Friday, Cimzia once in 30 days), hypertension, diabetes, obstructive sleep apnea, obesity, history of gout   Plan: It is unclear as to what precipitated nausea and decreased appetite for 8 days prior to presentation. As an outpatient she will benefit from gastric emptying scan to rule out gastroparesis possibly related to diabetes. She has been able to tolerate carbohydrate modified diet without further episodes of nausea, continue the same.  For lymphocytic colitis, she has been on budesonide 9 mg daily started since 07/13/2020 on admission. Although she did not have a bowel movement for a few days, she should not be given laxatives, especially MiraLAX, which will be discontinued.  Patient is worried about having explosive diarrhea due to lymphocytic colitis, will resume her on loperamide, 2 mg as needed, up to 2 pills 3 times a day.  I have discussed with the patient, as well as her daughter over the phone, that the patient will benefit from skipping Imodium IF she develops constipation(hard stools, need to strain , if she has days without a bowel movement)  instead of using laxatives.  Although hypotension has resolved, she continues to have dry oral mucous membrane along with mild tachycardia and will benefit from continued gentle IV fluid hydration, perhaps at 50 cc an hour. Her urine output was over 2 L in the last 24 hours.   Recommend continued use of budesonide, and use of Imodium as needed, up to 6 pills a day for lymphocytic colitis.  Please recall [place] if needed.   LOS: 4 days   Ronnette Juniper, MD  07/16/2020, 3:18 PM

## 2020-07-17 ENCOUNTER — Other Ambulatory Visit: Payer: Self-pay

## 2020-07-17 ENCOUNTER — Encounter (HOSPITAL_BASED_OUTPATIENT_CLINIC_OR_DEPARTMENT_OTHER): Payer: Medicare Other | Admitting: Physician Assistant

## 2020-07-17 ENCOUNTER — Encounter (HOSPITAL_BASED_OUTPATIENT_CLINIC_OR_DEPARTMENT_OTHER): Payer: Medicare Other | Attending: Physician Assistant | Admitting: Physician Assistant

## 2020-07-17 DIAGNOSIS — E1151 Type 2 diabetes mellitus with diabetic peripheral angiopathy without gangrene: Secondary | ICD-10-CM | POA: Insufficient documentation

## 2020-07-17 DIAGNOSIS — E1140 Type 2 diabetes mellitus with diabetic neuropathy, unspecified: Secondary | ICD-10-CM | POA: Insufficient documentation

## 2020-07-17 DIAGNOSIS — E11621 Type 2 diabetes mellitus with foot ulcer: Secondary | ICD-10-CM | POA: Diagnosis not present

## 2020-07-17 DIAGNOSIS — L97522 Non-pressure chronic ulcer of other part of left foot with fat layer exposed: Secondary | ICD-10-CM | POA: Diagnosis not present

## 2020-07-17 LAB — CULTURE, BLOOD (ROUTINE X 2)
Culture: NO GROWTH
Culture: NO GROWTH
Special Requests: ADEQUATE

## 2020-07-17 LAB — COMPREHENSIVE METABOLIC PANEL
ALT: 22 U/L (ref 0–44)
AST: 26 U/L (ref 15–41)
Albumin: 2.4 g/dL — ABNORMAL LOW (ref 3.5–5.0)
Alkaline Phosphatase: 38 U/L (ref 38–126)
Anion gap: 8 (ref 5–15)
BUN: 24 mg/dL — ABNORMAL HIGH (ref 8–23)
CO2: 24 mmol/L (ref 22–32)
Calcium: 8 mg/dL — ABNORMAL LOW (ref 8.9–10.3)
Chloride: 106 mmol/L (ref 98–111)
Creatinine, Ser: 1.27 mg/dL — ABNORMAL HIGH (ref 0.44–1.00)
GFR, Estimated: 44 mL/min — ABNORMAL LOW (ref >60)
Glucose, Bld: 192 mg/dL — ABNORMAL HIGH (ref 70–99)
Potassium: 3.8 mmol/L (ref 3.5–5.1)
Sodium: 138 mmol/L (ref 135–145)
Total Bilirubin: 0.5 mg/dL (ref 0.3–1.2)
Total Protein: 4.7 g/dL — ABNORMAL LOW (ref 6.5–8.1)

## 2020-07-17 LAB — PROTEIN ELECTROPHORESIS, SERUM
A/G Ratio: 1.1 (ref 0.7–1.7)
Albumin ELP: 2.5 g/dL — ABNORMAL LOW (ref 2.9–4.4)
Alpha-1-Globulin: 0.2 g/dL (ref 0.0–0.4)
Alpha-2-Globulin: 0.8 g/dL (ref 0.4–1.0)
Beta Globulin: 0.9 g/dL (ref 0.7–1.3)
Gamma Globulin: 0.4 g/dL (ref 0.4–1.8)
Globulin, Total: 2.3 g/dL (ref 2.2–3.9)
Total Protein ELP: 4.8 g/dL — ABNORMAL LOW (ref 6.0–8.5)

## 2020-07-17 LAB — CBC
HCT: 27.4 % — ABNORMAL LOW (ref 36.0–46.0)
Hemoglobin: 9.3 g/dL — ABNORMAL LOW (ref 12.0–15.0)
MCH: 30.1 pg (ref 26.0–34.0)
MCHC: 33.9 g/dL (ref 30.0–36.0)
MCV: 88.7 fL (ref 80.0–100.0)
Platelets: 221 10*3/uL (ref 150–400)
RBC: 3.09 MIL/uL — ABNORMAL LOW (ref 3.87–5.11)
RDW: 18.2 % — ABNORMAL HIGH (ref 11.5–15.5)
WBC: 11.7 10*3/uL — ABNORMAL HIGH (ref 4.0–10.5)
nRBC: 0 % (ref 0.0–0.2)

## 2020-07-17 LAB — GLUCOSE, CAPILLARY
Glucose-Capillary: 181 mg/dL — ABNORMAL HIGH (ref 70–99)
Glucose-Capillary: 147 mg/dL — ABNORMAL HIGH (ref 70–99)
Glucose-Capillary: 148 mg/dL — ABNORMAL HIGH (ref 70–99)

## 2020-07-17 MED ORDER — FUROSEMIDE 20 MG PO TABS: 20.0000 mg | ORAL_TABLET | Freq: Every day | ORAL | 0 refills | Status: DC

## 2020-07-17 MED ORDER — METFORMIN HCL ER 500 MG PO TB24
500.0000 mg | ORAL_TABLET | Freq: Two times a day (BID) | ORAL | Status: DC
Start: 2020-07-17 — End: 2021-05-26

## 2020-07-17 NOTE — Discharge Summary (Signed)
Physician Discharge Summary  [name] [name] GYJ:[id number] DOB: October 29, 1945 DOA: 07/12/2020  PCP: Binnie Rail, MD  Admit date: 07/12/2020 Discharge date: 07/17/2020  Time spent: 35 minutes  Recommendations for Outpatient Follow-up:  1. PCP Dr. Quay Burow in 1 week, please check BMP at follow-up, please assess edema/volume status at follow-up 2. [place] gastro[facility]ology Dr. Cristina Gong in 2 weeks 3. Home health services   Discharge Diagnoses:  Principal Problem:   ARF (acute renal failure) (HCC) Hypovolemic shock   OSA (obstructive sleep apnea), BiPAP   Essential hypertension   DM2 (diabetes mellitus, type 2) (HCC)   Psoriasis   Diabetic foot ulcer ([place])   Deep venous thrombosis of lower extremity (HCC)   Lymphocytic-plasmacytic colitis   Increased anion gap metabolic acidosis   Shock ([place])   Discharge Condition: Stable  Diet recommendation: Low-sodium  Filed Weights   07/12/20 1130  Weight: 112.9 kg    History of present illness:  74 y.o.femalewithhistory of diabetes mellitus type 2, hypertension, lymphocytic colitis, psoriatic and rheumatoid arthritis on immunosuppressants, sleep apnea previous history of DVT presents to the ER after patient was feeling weak and dizzy with persistent nausea with some vomiting unable to eat well since Thanksgiving almost a week now.   Hospital Course:   Hypovolemic shock Lactic acidosis Acute renal failure Nausea and vomiting -Improved with fluid resuscitation, briefly required low-dose norepinephrine in the ICU, was able to be quickly weaned off pressors -This is felt to be secondary to GI losses -CT abdomen pelvis without contrast was unrevealing -Blood cultures and urine cultures were negative -Clinically improved with supportive care blood pressure stable now -Creatinine improved to 1.2 from 3.2 at the time of admission -With third spacing and lower extremity edema today at the time of discharge, lungs are clear, discharged home on  Lasix 20 mg daily for 5 days with extra potassium, lisinopril/HCTZ was discontinued -Will need labs checked, BMP in 1 week  Chronic left foot wound -This was not felt to be the source of infection -Due for a follow-up later today at the wound center  Type 2 diabetes mellitus -CBG stable, Levemir resumed -Metformin resumed at a lower dose, can be increased at follow-up back to basal dose based on repeat creatinine  History of lymphocytic colitis -Did have a bout of diarrhea after getting MiraLAX for constipation -Budesonide resumed and as needed Imodium resumed as well per home regimen -Will need close follow-up with [facility] gastro[facility]ology  History of psoriatic and rheumatoid arthritis -On methotrexate and biologic agent  Discharge Exam: Vitals:   07/17/20 0315 07/17/20 0736  BP: 127/72 (!) 155/78  Pulse:  98  Resp: 16 19  Temp: 98.6 F (37 C) 98 F (36.7 C)  SpO2: 99% 96%    General: AAOx3 Cardiovascular:S1S2/RRR Respiratory: CTAB  Discharge Instructions   Discharge Instructions    Diet - low sodium heart healthy   Complete by: As directed    Diet Carb Modified   Complete by: As directed    Increase activity slowly   Complete by: As directed      Allergies as of 07/17/2020      Reactions   Other Shortness Of Breath   Blue fish: palms and feet turn red   Bee Venom Swelling   Farxiga [dapagliflozin]    Recurrent yeast infections   Sulfa Antibiotics Hives      Medication List    STOP taking these medications   cephALEXin 500 MG capsule Commonly known as: KEFLEX   ciprofloxacin 250 MG  tablet Commonly known as: Cipro   docusate sodium 100 MG capsule Commonly known as: Colace   lisinopril-hydrochlorothiazide 20-25 MG tablet Commonly known as: ZESTORETIC     TAKE these medications   acetaminophen 325 MG tablet Commonly known as: TYLENOL Take 2 tablets (650 mg total) by mouth every 6 (six) hours as needed for mild pain or headache.   ALIGN PO Take  1 capsule by mouth daily.   allopurinol 100 MG tablet Commonly known as: ZYLOPRIM Take 1 tablet (100 mg total) by mouth 2 (two) times daily.   ALPRAZolam 0.25 MG tablet Commonly known as: XANAX Take 1 tablet (0.25 mg total) by mouth 2 (two) times daily as needed for anxiety.   ascorbic acid 500 MG tablet Commonly known as: VITAMIN C Take 500 mg by mouth at bedtime.   aspirin 81 MG tablet Take 81 mg by mouth See admin instructions. Every 3 days   azelastine 0.1 % nasal spray Commonly known as: ASTELIN Place 2 sprays into both nostrils 2 (two) times daily. Use in each nostril as directed What changed: when to take this   B-D UF III MINI PEN NEEDLES 31G X 5 MM Misc Generic drug: Insulin Pen Needle Use daily for insulin pen   CIMZIA [place] Inject 400 mg into the skin every 30 (thirty) days. 200 mg on each side of stomach   citalopram 40 MG tablet Commonly known as: CELEXA TAKE 1 TABLET BY MOUTH EVERY DAY   cyclobenzaprine 10 MG tablet Commonly known as: FLEXERIL TAKE 1 TABLET BY MOUTH AT BEDTIME   folic acid 1 MG tablet Commonly known as: FOLVITE TAKE 1 TABLET(1 MG) BY MOUTH DAILY What changed: See the new instructions.   furosemide 20 MG tablet Commonly known as: Lasix Take 1 tablet (20 mg total) by mouth daily for 5 days. Take lasix 20mg  daily for 5days, take and extra Potassium pill while you are on lasix (5days)   Levemir FlexTouch 100 UNIT/ML FlexPen Generic drug: insulin detemir ADMINISTER 20 UNITS UNDER THE SKIN TWICE DAILY What changed:   how much to take  how to take this  when to take this  additional instructions   loperamide 2 MG capsule Commonly known as: IMODIUM Take 6 mg by mouth 2 (two) times daily.   Melatonin 5 MG Subl Place 5 mg under the tongue at bedtime.   metFORMIN 500 MG 24 hr tablet Commonly known as: GLUCOPHAGE-XR Take 1 tablet (500 mg total) by mouth 2 (two) times daily before a meal. What changed: how much to take    methotrexate 2.5 MG tablet Commonly known as: RHEUMATREX Take 20 mg by mouth every Friday.   naftifine 1 % cream Commonly known as: NAFTIN Apply 1 application topically daily as needed (rash).   omeprazole 40 MG capsule Commonly known as: PRILOSEC Take 40 mg by mouth at bedtime.   potassium chloride 10 MEQ tablet Commonly known as: KLOR-CON TAKE 2 TABLETS(20 MEQ) BY MOUTH DAILY What changed: See the new instructions.   prednisoLONE acetate 1 % ophthalmic suspension Commonly known as: PRED FORTE Place 4 drops into the right eye See admin instructions. 4 drops  daily for 1 week, 2 drops daily until post op. Surgery 07/11/20   rosuvastatin 5 MG tablet Commonly known as: Crestor Take 1 tablet (5 mg total) by mouth daily. What changed: when to take this   triamcinolone 0.1 % Commonly known as: KENALOG Apply 1 application topically 2 (two) times daily. What changed:   when to  take this  reasons to take this   Uceris 9 MG Tb24 Generic drug: Budesonide Take 9 mg by mouth daily.   Vitamin B-12 1000 MCG Subl Place 1,000 mcg under the tongue daily.   Vitamin D3 25 MCG (1000 UT) Caps Take 1,000 Units by mouth 2 (two) times daily.      Allergies  Allergen Reactions  . Other Shortness Of Breath    Blue fish: palms and feet turn red  . Bee Venom Swelling  . Wilder Glade [Dapagliflozin]     Recurrent yeast infections  . Sulfa Antibiotics Hives    Follow-up Information    [facility] Follow up.   Specialty: Home Health Services Why: For home health services. They will contact you in 1-2 days to schedule your first home appointment Contact information: [place] [address] [phone]                The results of significant diagnostics from this hospitalization (including imaging, microbiology, ancillary and laboratory) are listed below for reference.    Significant Diagnostic Studies: CT Abdomen Pelvis Wo  Contrast  Result Date: 07/12/2020 CLINICAL DATA:  Abdominal distension, anorexia EXAM: CT ABDOMEN AND PELVIS WITHOUT CONTRAST TECHNIQUE: Multidetector CT imaging of the abdomen and pelvis was performed following the standard protocol without IV contrast. COMPARISON:  11/02/2018 FINDINGS: Lower chest: No acute pleural or parenchymal lung disease. Hepatobiliary: No focal liver abnormality is seen. No gallstones, gallbladder wall thickening, or biliary dilatation. Pancreas: Unremarkable. No pancreatic ductal dilatation or surrounding inflammatory changes. Spleen: Normal in size without focal abnormality. Adrenals/Urinary Tract: No urinary tract calculi or obstructive uropathy. The bladder is unremarkable. The adrenals are normal. Stomach/Bowel: No bowel obstruction or ileus. Diverticulosis of the colon with no evidence of acute diverticulitis. Normal appendix right lower quadrant. No bowel wall thickening or inflammatory change. Vascular/Lymphatic: Aortic atherosclerosis. No enlarged abdominal or pelvic lymph nodes. Reproductive: Uterus and bilateral adnexa are unremarkable. Other: No free fluid or free gas.  No abdominal wall hernia. Musculoskeletal: Postsurgical changes from right hip arthroplasty. No acute or destructive bony lesions. Reconstructed images demonstrate no additional findings. IMPRESSION: 1. Diverticulosis without diverticulitis. 2. No acute intra-abdominal or intrapelvic process. 3.  Aortic Atherosclerosis (ICD10-I70.0). Electronically Signed   By: Randa Ngo M.D.   On: 07/12/2020 20:48   DG Chest Port 1 View  Result Date: 07/12/2020 CLINICAL DATA:  Weakness. EXAM: PORTABLE CHEST 1 VIEW COMPARISON:  None. FINDINGS: The heart size and mediastinal contours are within normal limits. Both lungs are clear. The visualized skeletal structures are unremarkable. IMPRESSION: No active disease. Electronically Signed   By: Dorise Bullion III M.D   On: 07/12/2020 17:38    Microbiology: Recent  Results (from the past 240 hour(s))  Resp Panel by RT-PCR (Flu A&B, Covid) Nasopharyngeal Swab     Status: None   Collection Time: 07/12/20  5:00 PM   Specimen: Nasopharyngeal Swab; Nasopharyngeal(NP) swabs in vial transport medium  Result Value Ref Range Status   SARS Coronavirus 2 by RT PCR NEGATIVE NEGATIVE Final    Comment: (NOTE) SARS-CoV-2 target nucleic acids are NOT DETECTED.  The SARS-CoV-2 RNA is generally detectable in upper respiratory specimens during the acute phase of infection. The lowest concentration of SARS-CoV-2 viral copies this assay can detect is 138 copies/mL. A negative result does not preclude SARS-Cov-2 infection and should not be used as the sole basis for treatment or other patient management decisions. A negative result may occur with  improper specimen collection/handling, submission of specimen other than nasopharyngeal swab, presence of viral mutation(s) within the areas targeted by this assay, and inadequate number of viral copies(<138 copies/mL). A negative result must be combined with clinical observations, patient history, and epidemiological information. The expected result is Negative.  Fact Sheet for Patients:  EntrepreneurPulse.com.au  Fact Sheet for Healthcare Providers:  IncredibleEmployment.be  This test is no t yet approved or cleared by the [place] FDA and  has been authorized for detection and/or diagnosis of SARS-CoV-2 by FDA under an Emergency Use Authorization (EUA). This EUA will remain  in effect (meaning this test can be used) for the duration of the COVID-19 declaration under Section 564(b)(1) of the Act, 21 U.S.C.section 360bbb-3(b)(1), unless the authorization is terminated  or revoked sooner.       Influenza A by PCR NEGATIVE NEGATIVE Final   Influenza B by PCR NEGATIVE NEGATIVE Final    Comment: (NOTE) The Xpert Xpress SARS-CoV-2/FLU/RSV plus assay is intended as an aid in the  diagnosis of influenza from Nasopharyngeal swab specimens and should not be used as a sole basis for treatment. Nasal washings and aspirates are unacceptable for Xpert Xpress SARS-CoV-2/FLU/RSV testing.  Fact Sheet for Patients: EntrepreneurPulse.com.au  Fact Sheet for Healthcare Providers: IncredibleEmployment.be  This test is not yet approved or cleared by the [place] FDA and has been authorized for detection and/or diagnosis of SARS-CoV-2 by FDA under an Emergency Use Authorization (EUA). This EUA will remain in effect (meaning this test can be used) for the duration of the COVID-19 declaration under Section 564(b)(1) of the Act, 21 U.S.C. section 360bbb-3(b)(1), unless the authorization is terminated or revoked.  Performed at [facility], [place] [address] [place], [place] [address]   Blood culture (routine x 2)     Status: None   Collection Time: 07/12/20  5:25 PM   Specimen: BLOOD  Result Value Ref Range Status   Specimen Description BLOOD LEFT ANTECUBITAL  Final   Special Requests   Final    BOTTLES DRAWN AEROBIC AND ANAEROBIC Blood Culture results may not be optimal due to an excessive volume of blood received in culture bottles   Culture   Final    NO GROWTH 5 DAYS Performed at [facility], [place] [address] [place], [place] [address]    Report Status 07/17/2020 FINAL  Final  Blood culture (routine x 2)     Status: None   Collection Time: 07/12/20  5:45 PM   Specimen: BLOOD  Result Value Ref Range Status   Specimen Description BLOOD SITE NOT SPECIFIED  Final   Special Requests   Final    BOTTLES DRAWN AEROBIC AND ANAEROBIC Blood Culture adequate volume   Culture   Final    NO GROWTH 5 DAYS Performed at [facility], [place] [address] [place], [place] [address]    Report Status 07/17/2020 FINAL  Final  Culture, Urine     Status: None   Collection Time: 07/13/20 11:17 AM   Specimen: Urine, Random  Result  Value Ref Range Status   Specimen Description URINE, RANDOM  Final   Special Requests Immunocompromised  Final   Culture   Final    NO GROWTH Performed at [facility], [place] [address] [place], [place] [address]    Report Status 07/15/2020 FINAL  Final  MRSA PCR Screening     Status: None   Collection Time: 07/13/20  5:07 PM   Specimen: Urine, Unspecified  Source; Nasopharyngeal  Result Value Ref Range Status   MRSA by PCR NEGATIVE NEGATIVE Final    Comment:        The GeneXpert MRSA Assay (FDA approved for NASAL specimens only), is one component of a comprehensive MRSA colonization surveillance program. It is not intended to diagnose MRSA infection nor to guide or monitor treatment for MRSA infections. Performed at [facility], [place] [address], [place], [place] [address]      Labs: Basic Metabolic Panel: Recent Labs  Lab 07/14/20 0042 07/14/20 0606 07/15/20 0259 07/16/20 0155 07/17/20 0343  NA 128* 132* 130* 133* 138  K 4.1 4.1 3.7 3.1* 3.8  CL 95* 97* 95* 99 106  CO2 22 22 24 23 24   GLUCOSE 416* 303* 325* 219* 192*  BUN 26* 26* 34* 30* 24*  CREATININE 2.64* 2.42* 2.16* 1.66* 1.27*  CALCIUM 8.2* 8.3* 8.1* 8.2* 8.0*   Liver Function Tests: Recent Labs  Lab 07/13/20 0511 07/15/20 0259 07/16/20 0155 07/17/20 0343  AST 37 25 22 26   ALT 40 31 27 22   ALKPHOS 42 42 39 38  BILITOT 0.8 0.6 0.4 0.5  PROT 4.7* 5.1* 5.0* 4.7*  ALBUMIN 2.3* 2.3* 2.4* 2.4*   Recent Labs  Lab 07/13/20 1155  LIPASE 19   No results for input(s): AMMONIA in the last 168 hours. CBC: Recent Labs  Lab 07/13/20 0511 07/13/20 0511 07/13/20 2212 07/14/20 0606 07/15/20 0259 07/16/20 0155 07/17/20 0343  WBC 12.4*   < > 11.5* 13.8* 13.5* 13.3* 11.7*  NEUTROABS 9.2*  --   --   --   --   --   --   HGB 9.7*   < > 9.7* 9.9* 9.7* 10.0* 9.3*  HCT 30.6*   < > 28.5* 30.8* 29.8* 29.5* 27.4*  MCV 90.8   < > 87.2 89.3 88.4 87.3 88.7  PLT 236   < > 213 222 217 230 221   < > =  values in this interval not displayed.   Cardiac Enzymes: No results for input(s): CKTOTAL, CKMB, CKMBINDEX, TROPONINI in the last 168 hours. BNP: BNP (last 3 results) No results for input(s): BNP in the last 8760 hours.  ProBNP (last 3 results) No results for input(s): PROBNP in the last 8760 hours.  CBG: Recent Labs  Lab 07/16/20 1628 07/16/20 2124 07/17/20 0308 07/17/20 0725 07/17/20 1141  GLUCAP 311* 209* 181* 147* 148*       Signed:  Domenic Polite MD.  [facility] 07/17/2020, 4:24 PM

## 2020-07-17 NOTE — Plan of Care (Signed)
  Problem: Education: Goal: Knowledge of General Education information will improve Description: Including pain rating scale, medication(s)/side effects and non-pharmacologic comfort measures Outcome: Adequate for Discharge   Problem: Health Behavior/Discharge Planning: Goal: Ability to manage health-related needs will improve Outcome: Adequate for Discharge   Problem: Clinical Measurements: Goal: Ability to maintain clinical measurements within normal limits will improve Outcome: Adequate for Discharge Goal: Will remain free from infection Outcome: Adequate for Discharge Goal: Diagnostic test results will improve Outcome: Adequate for Discharge Goal: Respiratory complications will improve Outcome: Adequate for Discharge Goal: Cardiovascular complication will be avoided Outcome: Adequate for Discharge   Problem: Activity: Goal: Risk for activity intolerance will decrease Outcome: Adequate for Discharge   Problem: Nutrition: Goal: Adequate nutrition will be maintained Outcome: Adequate for Discharge   Problem: Coping: Goal: Level of anxiety will decrease Outcome: Adequate for Discharge   Problem: Elimination: Goal: Will not experience complications related to bowel motility Outcome: Adequate for Discharge Goal: Will not experience complications related to urinary retention Outcome: Adequate for Discharge   Problem: Pain Managment: Goal: General experience of comfort will improve Outcome: Adequate for Discharge   Problem: Safety: Goal: Ability to remain free from injury will improve Outcome: Adequate for Discharge   Problem: Skin Integrity: Goal: Risk for impaired skin integrity will decrease Outcome: Adequate for Discharge   Problem: Acute Rehab PT Goals(only PT should resolve) Goal: Pt Will Go Up/Down Stairs Outcome: Adequate for Discharge   Problem: Acute Rehab OT Goals (only OT should resolve) Goal: Pt. Will Perform Grooming Outcome: Adequate for  Discharge Goal: Pt. Will Perform Lower Body Bathing Outcome: Adequate for Discharge Goal: Pt. Will Perform Lower Body Dressing Outcome: Adequate for Discharge Goal: Pt. Will Transfer To Toilet Outcome: Adequate for Discharge Goal: Pt. Will Perform Toileting-Clothing Manipulation Outcome: Adequate for Discharge   Problem: Inadequate Intake (NI-2.1) Goal: Food and/or nutrient delivery Description: Individualized approach for food/nutrient provision. Outcome: Adequate for Discharge

## 2020-07-17 NOTE — Progress Notes (Signed)
Physical Therapy Treatment Patient Details Name: [name] [name] MRN: [id number] DOB: [birth year] Today's Date: 07/17/2020    History of Present Illness 74 y.o.femalewithPMH including diabetes mellitus type 2, HTN, lymphocytic colitis, psoriatic and rheumatoid arthritis on immunosuppressants, sleep apnea previous history of DVT presents to the ER after patient was feeling weak and dizzy with persistent nausea with some vomiting unable to eat well almost a week now. Has had some abdominal discomfort in the left lower quadrant which has improved. Pt was found to be hypotensive on presentation despite aggressive IVF hydration ultimately requiring pressor support.    PT Comments    Pt doing well with mobility. Ready for dc home with family.    Follow Up Recommendations  Home health PT     Equipment Recommendations  None recommended by PT    Recommendations for Other Services       Precautions / Restrictions Precautions Precautions: Fall;Other (comment) Precaution Comments: L foot cast and boot  Required Braces or Orthoses: Splint/Cast Splint/Cast: L foot boot with cast to protect skin Restrictions Other Position/Activity Restrictions: monitor for overuse of LLE    Mobility  Bed Mobility Overal bed mobility: Modified Independent Bed Mobility: Supine to Sit     Supine to sit: Modified independent (Device/Increase time);HOB elevated        Transfers Overall transfer level: Needs assistance Equipment used: Rolling walker (2 wheeled);1 person hand held assist Transfers: Sit to/from Stand Sit to Stand: Min guard         General transfer comment: Assist for safety and lines  Ambulation/Gait Ambulation/Gait assistance: Supervision Gait Distance (Feet): 25 Feet Assistive device: Rolling walker (2 wheeled) Gait Pattern/deviations: Step-through pattern;Decreased stride length Gait velocity: decr Gait velocity interpretation: <1.31 ft/sec, indicative of household  ambulator General Gait Details: Assist for Therapist, music    Modified Rankin (Stroke Patients Only)       Balance Overall balance assessment: History of Falls;Needs assistance Sitting-balance support: Feet supported;Bilateral upper extremity supported Sitting balance-Leahy Scale: Fair     Standing balance support: Bilateral upper extremity supported;During functional activity Standing balance-Leahy Scale: Poor Standing balance comment: walker and supervision for static standing                            Cognition Arousal/Alertness: Awake/alert Behavior During Therapy: WFL for tasks assessed/performed Overall Cognitive Status: Within Functional Limits for tasks assessed                                        Exercises      General Comments        Pertinent Vitals/Pain      Home Living Family/patient expects to be discharged to:: Private residence Living Arrangements: Spouse/significant other                  Prior Function            PT Goals (current goals can now be found in the care plan section) Acute Rehab PT Goals Patient Stated Goal: return home Progress towards PT goals: Progressing toward goals;Goals met and updated - see care plan    Frequency    Min 3X/week      PT Plan Current plan remains appropriate    Co-evaluation  AM-PAC PT "6 Clicks" Mobility   Outcome Measure  Help needed turning from your back to your side while in a flat bed without using bedrails?: None Help needed moving from lying on your back to sitting on the side of a flat bed without using bedrails?: None Help needed moving to and from a bed to a chair (including a wheelchair)?: A Little Help needed standing up from a chair using your arms (e.g., wheelchair or bedside chair)?: A Little Help needed to walk in hospital room?: A Little Help needed climbing 3-5 steps  with a railing? : A Little 6 Click Score: 20    End of Session   Activity Tolerance: Patient tolerated treatment well Patient left: in chair;with call bell/phone within reach Nurse Communication: Mobility status PT Visit Diagnosis: Other abnormalities of gait and mobility (R26.89)     Time: 0413-6438 PT Time Calculation (min) (ACUTE ONLY): 18 min  Charges:  $Gait Training: 8-22 mins                     [place] Pager [phone] Office [place] 07/17/2020, 12:22 PM

## 2020-07-17 NOTE — Progress Notes (Signed)
OT Cancellation Note  Patient Details Name: [name] [name] MRN: [id number] DOB: 1945/09/05   Cancelled Treatment:    Reason Eval/Treat Not Completed: Other (comment). Pt politely declined OT session citing current digestive upset/discomfort. Plan to reattempt at a later time/date.   Tyrone Schimke, OT Acute Rehabilitation Services Pager: [phone] Office: [phone]  07/17/2020, 10:50 AM

## 2020-07-17 NOTE — Progress Notes (Addendum)
[name] [name], [name] [name] ([id number]) Visit Report for 07/17/2020 Chief Complaint Document Details Patient Name: Date of Service: [name] [name], [name] E. 07/17/2020 3:00 PM Medical Record Number: [id number] Patient Account Number: [id number] Date of Birth/Sex: Treating RN: February 13, 1946 (74 y.o. [name] Other Clinician: Referring Provider: Treating Provider/Extender: Adele Schilder in Treatment: 26 Information Obtained from: Patient Chief Complaint Left foot ulcer Electronic Signature(s) Signed: 07/17/2020 3:42:52 PM By: Worthy Keeler PA-C Entered By: Worthy Keeler on 07/17/2020 15:42:51 -------------------------------------------------------------------------------- Debridement Details Patient Name: Date of Service: [name] [name] 07/17/2020 3:00 PM Medical Record Number: [id number] Patient Account Number: [id number] Date of Birth/Sex: Treating RN: [birth year] (74 y.o. [name] Other Clinician: Referring Provider: Treating Provider/Extender: Adele Schilder in Treatment: 26 Debridement Performed for Assessment: Wound #1R Left,Medial,Plantar Foot Performed By: Physician Worthy Keeler, PA Debridement Type: Debridement Severity of Tissue Pre Debridement: Fat layer exposed Level of Consciousness (Pre-procedure): Awake and Alert Pre-procedure Verification/Time Out Yes - 16:25 Taken: Start Time: 16:28 Pain Control: Other : benzocaine 20% spray T Area Debrided (L x W): otal 1 (cm) x 1 (cm) = 1 (cm) Tissue and other material debrided: Non-Viable, Callus, Skin: Epidermis Level: Skin/Epidermis Debridement Description: Selective/Open Wound Instrument: Curette Bleeding: None End Time: 16:30 Procedural Pain: 0 Post Procedural Pain: 0 Response to Treatment: Procedure was tolerated well Level of Consciousness (Post- Awake and Alert procedure): Post Debridement  Measurements of Total Wound Length: (cm) 0.2 Width: (cm) 0.4 Depth: (cm) 0.1 Volume: (cm) 0.006 Character of Wound/Ulcer Post Debridement: Improved Severity of Tissue Post Debridement: Fat layer exposed Post Procedure Diagnosis Same as Pre-procedure Electronic Signature(s) Signed: 07/17/2020 6:12:31 PM By: [name] RN, BSN Signed: 07/18/2020 1:30:16 PM By: Worthy Keeler PA-C Entered By: [name] on 07/17/2020 16:31:16 -------------------------------------------------------------------------------- HPI Details Patient Name: Date of Service: [name] [name], [name] RA E. 07/17/2020 3:00 PM Medical Record Number: [id number] Patient Account Number: [id number] Date of Birth/Sex: Treating RN: July 16, 1946 (74 y.o. [name] Other Clinician: Referring Provider: Treating Provider/Extender: Adele Schilder in Treatment: 26 History of Present Illness HPI Description: 01/17/2020 upon evaluation today patient presents for initial evaluation here in our clinic concerning issues she has been having with a left medial/plantar foot ulcer. This is actually been an issue for her since October 2020. She has been seeing Dr. Doran Durand for quite some time during that course. Fortunately there is no signs of active infection at this time. Or least no mention of this to have seen in general. With that being said unfortunately I do see some signs of erythema noted today that does have me concerned about the possibility of infection at this point in the surrounding area of the wound. There is also a warm to touch at the site which is somewhat concerning. Fortunately there is no evidence of systemic infection which is great news. The patient does have a history of diabetes mellitus type 2, Charcot foot which is what led to the wound, and hypertension. She notes that she was in a cast for some time with Dr. Doran Durand for about 8 weeks. During that time they  were utilizing according to the patient silver nitrate along with a foam doughnut and then Coban to secure in place in the cast in place. With that being said I do not have the actual records to review we are going  to try to get a hold of those unfortunately they would not flow over into care everywhere I did look today. She has been seeing Dr. Doran Durand and his physician assistant Larkin Ina up until the end of May and apparently is still seeing them on a regular basis every 2 weeks roughly. She has also tried Iodosorb without effect here. 01/24/2020 upon evaluation today patient actually appears to be doing quite well with regard to her wounds. She has been tolerating the dressing changes without complication. Fortunately there is no signs of active infection spreading which is good news. Her culture did show signs of Staph aureus I did place her on Augmentin due to the erythema surrounding the wound. With that being said the wound does appear to be doing better she has her longer walking cast/boot and I think that is actually good for her for the time being. I am considering reinitiating total contact cast when she gets back from vacation but next week she will actually be out of town at [employer] she knows not to get in the water but she still obviously is planning to enjoy herself she is going to take it easy on her foot however. 02/07/2020 upon evaluation today patient appears to be doing fairly well in regard to her ulcer on her foot. Fortunately there is no signs of severe infection at this time which is great news and overall very pleased in that regard. With that being said I do think that she could still benefit from a total contact cast. Nonetheless she is using her walking boot which at least provide some protection and that it prevents some of the friction occurring when she is ambulating. 02/14/2020 upon evaluation today patient appears to be doing well with regard to her foot ulcer. This is  actually measuring a little bit smaller yet again this week. Overall very pleased with where things stand and I do not see any signs of active infection at this time which is also good news. Since she is measuring better the patient has wanting to somewhat hold off on proceeding with the total contact cast which I think is reasonable at this point. 02/28/2020 on evaluation today patient appears to be doing well in general in regard to her wound although she has a lot of callus buildup as compared to last time I saw her. This is can require sharp debridement today. I do believe she really needs the total contact cast as well which we have discussed previous. 7/23; patient comes in for a total contact cast change 03/06/2020 on evaluation today patient appears to be doing quite well with regard to her wounds. Fortunately the wound bed is measuring smaller and looking much better there is little callus noted although there is some debridement necessary today. 03/13/2020 on evaluation today patient's wound actually appears to be doing excellent which is great news. With that being said unfortunately she is having some issues currently with her left leg where she does have cellulitis it appears. This may have come from an area that rubbed underneath the cast from last week that we noted we padded that area and it looks to be doing excellent at this point but nonetheless the leg was somewhat painful, swollen, and somewhat erythematous. She also had an elevated white blood cell count of 11.5 based on what I saw on looking at her records from the med center in [facility] from where she was seen yesterday. Unfortunately with [place] having a provider on vacation  there was no one here in the clinic in the afternoon when she called therefore she went to the ER as advised. Subsequently they did not cut off the cast as they did not have anyone from orthopedics there to do so and subsequently also did not have the ability to  do the Doppler for evaluation of DVT They recommended therefore given her dose of Eliquis as well as . Augmentin and sent her home to come see [place] today to have the cast taken off and then she is supposed to go back to have the study for DVT performed they are following. 03/20/2020 upon evaluation today patient appears to be doing well with regard to her foot all things considered we have not been able to use the total contact cast due to the infection that she had last week. She has been on the doxycycline and she had a 10-day supply of that I do believe that is helping and her leg appears to be doing better. With that being said there is fortunately no signs of active infection systemically at this time which is good news. No fevers, chills, nausea, vomiting, or diarrhea. 03/27/2020 upon evaluation today patient appears to be doing well with regard to her foot ulcer. There does not appear to be signs of active infection which is great news. Overall I am very pleased with where things stand at this point. 04/03/2020 upon evaluation today patient appears to be doing pretty well in regard to the overall appearance of her wound. Fortunately there is no signs of active infection at this time which is great news. No fevers, chills, nausea, vomiting, or diarrhea. With that being said she does have some blue-green drainage that actually is a little bit concerning to me for the possibility of Pseudomonas. I discussed that with the patient today. With that being said I do believe that we may be able to manage this however with the topical antibiotic cream as opposed to having to do anything oral especially since she seems to be doing so well with overall appearance of the wound. 04/10/2020 on evaluation today patient appears to be doing about the same roughly in regard to the overall size of her wound. With that being said she fortunately has not shown any signs of worsening overall which is good news. I do  believe that she is doing a great job trying to offload but again she may still do better with the cast. I do not see in the blue-green drainage that we noticed previously I do believe the gentamicin help in this regard. 04/17/2020 on evaluation today patient's wound appears to be doing about the same at this point. There is no significant improvement at this point. No fever chills noted. She is up for put the cast back on the day. That she states in a couple weeks she will need to have this off to go to a workshop. 04/24/2020 on evaluation today patient appears to be doing significantly better in regard to her wound. Fortunately there is no signs of active infection and overall feel like she is making great progress the cast seems to have done excellent for her. 05/01/2020 upon evaluation today patient presents for reevaluation she really does not appear to be doing too badly in regard to the actual wound on the left foot we have been managing. Unfortunately she has bilateral lower extremity edema with blisters between the webspace of her first and second toe on both feet. She has a tremendous amount  of edema in the legs which I think is where this is coming from it does not appear to be infected but nonetheless I do believe this is can be something that needs to be addressed today. Obviously this means we probably will not be putting the cast on at this point. She attributes this to the fact that she was sitting with her feet on the floor much longer during a conference last week she had a great time but unfortunately had a lot of complications as a result. 05/08/2020 upon evaluation today patient appears to be doing somewhat better in regard to her wounds at this time. Fortunately there is no signs of active infection which is great news. With that being said I do believe that the blisters have ruptured and unfortunately did not just reattach I will remove some of the blistered tissue today. With that  being said I do think the wound itself on the plantar aspect of left foot does need to have sharp debridement. 05/15/2020 upon evaluation today patient appears to be doing about the same in regard to her foot ulcer. Unfortunately in the past week her husband had a fall where he sustained a mild traumatic brain bleed. Fortunately he is doing better but being that he was in the hospital she had a walk on this a lot more. The wound does not appear to be any better is also not really appearing to be significantly worse which is good news. There is no signs of active infection at this time. 10/14; patient with a small diabetic wound on the medial part of her left foot. We have been using silver collagen a total contact cast making good progress. I think the patient had a series of blisters on her dorsal foot probably secondary to having her legs recumbent for 3 days while in a conference in [place]. We wrapped her leg last week these are all healed. We did not previously have her in compression on the right leg. 05/29/2020 upon evaluation today patient appears to be doing well with regard to the wound on the plantar aspect of her foot medially. This is measuring smaller and looking much better than last time I saw her. Again when I did see her last was 2 weeks back and the wound was significantly larger. I do believe the cast is helping and I believe the collagen is a good option for her. 06/05/2020 on evaluation today patient appears to be doing well with regard to her foot ulcer this is actually measuring significantly better and overall I feel like she is doing excellent. There is no signs of active infection at this time. 06/12/2020 upon evaluation today patient actually continues to show signs of good improvement which is excellent news. I am extremely pleased with how she seems to be progressing at this point in regard to her wound. There is still some depth to the wound but I do believe the collagen is  helping her quite a bit. 06/19/2020 upon evaluation today patient appears to be doing well with regard to her plantar foot ulcer. She is actually making excellent progress and in fact this appears to be almost completely healed. With that being said I do believe that the patient is going to actually require 1 more week in the cast although after that I am hopeful she will be ready for discharge. 06/26/2020 on evaluation today patient appears to be doing well in regard to her wound currently. Fortunately there is no signs of active  infection in general I feel like she is doing excellent. This appears to be completely healed I think she is ready to come out of the cast. 11/29; patient comes back in the clinic today with a very quick reopening in the exact same area on the medial plantar foot. She had been healed out last time. She went back into some new balance shoes that she got at [employer] with a custom insert. As it turns out this wound also happened when wearing these shoes although there was some modification made I think with the wound initially happened they added some foam around the wound area. This obviously is not going to be sufficient. 07/17/2020 on evaluation today patient appears to be doing well with regard to her wound. Fortunately she seems to be making good progress. Unfortunately she was in the hospital due to a issue with colitis and had just been discharged today in fact. She tells me that her biggest concern here is that a lot of her numbers especially her creatinine were somewhat elevated and problematic. She is can be following up with her provider in order to have a further work-up at this time. With that being said she did need to come back in for a cast change. She did not allow them to remove the cast due to the fact that she did not feel like that was the issue whatsoever and indeed it does not appear that was the case her wound appears to be doing excellent  today. Electronic Signature(s) Signed: 07/17/2020 4:37:03 PM By: Worthy Keeler PA-C Entered By: Worthy Keeler on 07/17/2020 16:37:02 -------------------------------------------------------------------------------- Physical Exam Details Patient Name: Date of Service: [name] [name], [name] RA E. 07/17/2020 3:00 PM Medical Record Number: [id number] Patient Account Number: [id number] Date of Birth/Sex: Treating RN: 1946/08/08 (74 y.o. [name] Other Clinician: Referring Provider: Treating Provider/Extender: Landis Martins Weeks in Treatment: 32 Constitutional Well-nourished and well-hydrated in no acute distress. Respiratory normal breathing without difficulty. Psychiatric this patient is able to make decisions and demonstrates good insight into disease process. Alert and Oriented x 3. pleasant and cooperative. Notes Upon inspection patient's wound bed actually showed signs of good granulation at this time. There does not appear to be any evidence of active infection which is great news in regard to the wound. She does seem very tired and worn out obviously after being in the hospital for an extended period of time 07/12/2020 through 07/17/2020 and all the work-up she had in between she is understandably worn out. Electronic Signature(s) Signed: 07/17/2020 4:37:34 PM By: Worthy Keeler PA-C Entered By: Worthy Keeler on 07/17/2020 16:37:34 -------------------------------------------------------------------------------- Physician Orders Details Patient Name: Date of Service: [name] [name], [name] RA E. 07/17/2020 3:00 PM Medical Record Number: [id number] Patient Account Number: [id number] Date of Birth/Sex: Treating RN: 07-Oct-1945 (74 y.o. [name] Other Clinician: Referring Provider: Treating Provider/Extender: Adele Schilder in Treatment: 30 Verbal / Phone Orders:  No Diagnosis Coding ICD-10 Coding Code Description E11.621 Type 2 diabetes mellitus with foot ulcer L97.522 Non-pressure chronic ulcer of other part of left foot with fat layer exposed M14.672 Charcot's joint, left ankle and foot E11.40 Type 2 diabetes mellitus with diabetic neuropathy, unspecified Follow-up Appointments Return Appointment in 1 week. Bathing/ Shower/ Hygiene May shower with protection but do not get wound dressing(s) wet. Edema Control - Lymphedema / SCD / Other Bilateral Lower Extremities Elevate legs to  the level of the heart or above for 30 minutes daily and/or when sitting, a frequency of: Avoid standing for long periods of time. Patient to wear own compression stockings every day. - right leg daily Off-Loading Total Contact Cast to Left Lower Extremity Wound Treatment Wound #1R - Foot Wound Laterality: Plantar, Left, Medial Prim Dressing: Hydrofera Blue Classic Foam, 2x2 in ary Discharge Instructions: Moisten with saline prior to applying to wound bed Secondary Dressing: Woven Gauze Sponges 2x2 in Discharge Instructions: Apply over primary dressing as directed. Secondary Dressing: Optifoam Non-Adhesive Dressing, 4x4 in Discharge Instructions: Apply over primary dressing cut to make a foam donut Secured With: 7M Medipore H Soft Cloth Surgical T 4 x 2 (in/yd) ape Discharge Instructions: Secure dressing with tape as directed. Patient Medications llergies: bee venom protein (honey bee), Sulfa (Sulfonamide Antibiotics), Farxiga A Notifications Medication Indication Start End prior to debridement 07/17/2020 benzocaine DOSE 0 - topical 20 % aerosol - 0 aerosol topical Electronic Signature(s) Signed: 07/17/2020 6:12:31 PM By: [name] RN, BSN Signed: 07/18/2020 1:30:16 PM By: Worthy Keeler PA-C Entered By: [name] on 07/17/2020 16:34:31 -------------------------------------------------------------------------------- Problem List Details Patient  Name: Date of Service: [name] Guarneri RBA RA E. 07/17/2020 3:00 PM Medical Record Number: [id number] Patient Account Number: [id number] Date of Birth/Sex: Treating RN: 1946/04/10 (74 y.o. [name] Other Clinician: Referring Provider: Treating Provider/Extender: Adele Schilder in Treatment: 26 Active Problems ICD-10 Encounter Code Description Active Date MDM Diagnosis E11.621 Type 2 diabetes mellitus with foot ulcer 01/17/2020 No Yes L97.522 Non-pressure chronic ulcer of other part of left foot with fat layer exposed 01/17/2020 No Yes M14.672 Charcot's joint, left ankle and foot 01/17/2020 No Yes E11.40 Type 2 diabetes mellitus with diabetic neuropathy, unspecified 01/17/2020 No Yes Inactive Problems ICD-10 Code Description Active Date Inactive Date I10 Essential (primary) hypertension 01/17/2020 01/17/2020 Resolved Problems Electronic Signature(s) Signed: 07/17/2020 3:42:43 PM By: Worthy Keeler PA-C Entered By: Worthy Keeler on 07/17/2020 15:42:43 -------------------------------------------------------------------------------- Progress Note Details Patient Name: Date of Service: [name] [name], [name] RA E. 07/17/2020 3:00 PM Medical Record Number: [id number] Patient Account Number: [id number] Date of Birth/Sex: Treating RN: [birth year] (74 y.o. [name] Other Clinician: Referring Provider: Treating Provider/Extender: Adele Schilder in Treatment: 26 Subjective Chief Complaint Information obtained from Patient Left foot ulcer History of Present Illness (HPI) 01/17/2020 upon evaluation today patient presents for initial evaluation here in our clinic concerning issues she has been having with a left medial/plantar foot ulcer. This is actually been an issue for her since October 2020. She has been seeing Dr. Doran Durand for quite some time during that course. Fortunately there is  no signs of active infection at this time. Or least no mention of this to have seen in general. With that being said unfortunately I do see some signs of erythema noted today that does have me concerned about the possibility of infection at this point in the surrounding area of the wound. There is also a warm to touch at the site which is somewhat concerning. Fortunately there is no evidence of systemic infection which is great news. The patient does have a history of diabetes mellitus type 2, Charcot foot which is what led to the wound, and hypertension. She notes that she was in a cast for some time with Dr. Doran Durand for about 8 weeks. During that time they were utilizing according to the patient silver nitrate  along with a foam doughnut and then Coban to secure in place in the cast in place. With that being said I do not have the actual records to review we are going to try to get a hold of those unfortunately they would not flow over into care everywhere I did look today. She has been seeing Dr. Doran Durand and his physician assistant Larkin Ina up until the end of May and apparently is still seeing them on a regular basis every 2 weeks roughly. She has also tried Iodosorb without effect here. 01/24/2020 upon evaluation today patient actually appears to be doing quite well with regard to her wounds. She has been tolerating the dressing changes without complication. Fortunately there is no signs of active infection spreading which is good news. Her culture did show signs of Staph aureus I did place her on Augmentin due to the erythema surrounding the wound. With that being said the wound does appear to be doing better she has her longer walking cast/boot and I think that is actually good for her for the time being. I am considering reinitiating total contact cast when she gets back from vacation but next week she will actually be out of town at [employer] she knows not to get in the water but she still obviously  is planning to enjoy herself she is going to take it easy on her foot however. 02/07/2020 upon evaluation today patient appears to be doing fairly well in regard to her ulcer on her foot. Fortunately there is no signs of severe infection at this time which is great news and overall very pleased in that regard. With that being said I do think that she could still benefit from a total contact cast. Nonetheless she is using her walking boot which at least provide some protection and that it prevents some of the friction occurring when she is ambulating. 02/14/2020 upon evaluation today patient appears to be doing well with regard to her foot ulcer. This is actually measuring a little bit smaller yet again this week. Overall very pleased with where things stand and I do not see any signs of active infection at this time which is also good news. Since she is measuring better the patient has wanting to somewhat hold off on proceeding with the total contact cast which I think is reasonable at this point. 02/28/2020 on evaluation today patient appears to be doing well in general in regard to her wound although she has a lot of callus buildup as compared to last time I saw her. This is can require sharp debridement today. I do believe she really needs the total contact cast as well which we have discussed previous. 7/23; patient comes in for a total contact cast change 03/06/2020 on evaluation today patient appears to be doing quite well with regard to her wounds. Fortunately the wound bed is measuring smaller and looking much better there is little callus noted although there is some debridement necessary today. 03/13/2020 on evaluation today patient's wound actually appears to be doing excellent which is great news. With that being said unfortunately she is having some issues currently with her left leg where she does have cellulitis it appears. This may have come from an area that rubbed underneath the cast from  last week that we noted we padded that area and it looks to be doing excellent at this point but nonetheless the leg was somewhat painful, swollen, and somewhat erythematous. She also had an elevated white blood cell  count of 11.5 based on what I saw on looking at her records from the med center in [facility] from where she was seen yesterday. Unfortunately with [place] having a provider on vacation there was no one here in the clinic in the afternoon when she called therefore she went to the ER as advised. Subsequently they did not cut off the cast as they did not have anyone from orthopedics there to do so and subsequently also did not have the ability to do the Doppler for evaluation of DVT They recommended therefore given her dose of Eliquis as well as . Augmentin and sent her home to come see [place] today to have the cast taken off and then she is supposed to go back to have the study for DVT performed they are following. 03/20/2020 upon evaluation today patient appears to be doing well with regard to her foot all things considered we have not been able to use the total contact cast due to the infection that she had last week. She has been on the doxycycline and she had a 10-day supply of that I do believe that is helping and her leg appears to be doing better. With that being said there is fortunately no signs of active infection systemically at this time which is good news. No fevers, chills, nausea, vomiting, or diarrhea. 03/27/2020 upon evaluation today patient appears to be doing well with regard to her foot ulcer. There does not appear to be signs of active infection which is great news. Overall I am very pleased with where things stand at this point. 04/03/2020 upon evaluation today patient appears to be doing pretty well in regard to the overall appearance of her wound. Fortunately there is no signs of active infection at this time which is great news. No fevers, chills, nausea, vomiting, or  diarrhea. With that being said she does have some blue-green drainage that actually is a little bit concerning to me for the possibility of Pseudomonas. I discussed that with the patient today. With that being said I do believe that we may be able to manage this however with the topical antibiotic cream as opposed to having to do anything oral especially since she seems to be doing so well with overall appearance of the wound. 04/10/2020 on evaluation today patient appears to be doing about the same roughly in regard to the overall size of her wound. With that being said she fortunately has not shown any signs of worsening overall which is good news. I do believe that she is doing a great job trying to offload but again she may still do better with the cast. I do not see in the blue-green drainage that we noticed previously I do believe the gentamicin help in this regard. 04/17/2020 on evaluation today patient's wound appears to be doing about the same at this point. There is no significant improvement at this point. No fever chills noted. She is up for put the cast back on the day. That she states in a couple weeks she will need to have this off to go to a workshop. 04/24/2020 on evaluation today patient appears to be doing significantly better in regard to her wound. Fortunately there is no signs of active infection and overall feel like she is making great progress the cast seems to have done excellent for her. 05/01/2020 upon evaluation today patient presents for reevaluation she really does not appear to be doing too badly in regard to the actual wound on  the left foot we have been managing. Unfortunately she has bilateral lower extremity edema with blisters between the webspace of her first and second toe on both feet. She has a tremendous amount of edema in the legs which I think is where this is coming from it does not appear to be infected but nonetheless I do believe this is can be something that  needs to be addressed today. Obviously this means we probably will not be putting the cast on at this point. She attributes this to the fact that she was sitting with her feet on the floor much longer during a conference last week she had a great time but unfortunately had a lot of complications as a result. 05/08/2020 upon evaluation today patient appears to be doing somewhat better in regard to her wounds at this time. Fortunately there is no signs of active infection which is great news. With that being said I do believe that the blisters have ruptured and unfortunately did not just reattach I will remove some of the blistered tissue today. With that being said I do think the wound itself on the plantar aspect of left foot does need to have sharp debridement. 05/15/2020 upon evaluation today patient appears to be doing about the same in regard to her foot ulcer. Unfortunately in the past week her husband had a fall where he sustained a mild traumatic brain bleed. Fortunately he is doing better but being that he was in the hospital she had a walk on this a lot more. The wound does not appear to be any better is also not really appearing to be significantly worse which is good news. There is no signs of active infection at this time. 10/14; patient with a small diabetic wound on the medial part of her left foot. We have been using silver collagen a total contact cast making good progress. I think the patient had a series of blisters on her dorsal foot probably secondary to having her legs recumbent for 3 days while in a conference in [place]. We wrapped her leg last week these are all healed. We did not previously have her in compression on the right leg. 05/29/2020 upon evaluation today patient appears to be doing well with regard to the wound on the plantar aspect of her foot medially. This is measuring smaller and looking much better than last time I saw her. Again when I did see her last was 2 weeks  back and the wound was significantly larger. I do believe the cast is helping and I believe the collagen is a good option for her. 06/05/2020 on evaluation today patient appears to be doing well with regard to her foot ulcer this is actually measuring significantly better and overall I feel like she is doing excellent. There is no signs of active infection at this time. 06/12/2020 upon evaluation today patient actually continues to show signs of good improvement which is excellent news. I am extremely pleased with how she seems to be progressing at this point in regard to her wound. There is still some depth to the wound but I do believe the collagen is helping her quite a bit. 06/19/2020 upon evaluation today patient appears to be doing well with regard to her plantar foot ulcer. She is actually making excellent progress and in fact this appears to be almost completely healed. With that being said I do believe that the patient is going to actually require 1 more week in the cast although after  that I am hopeful she will be ready for discharge. 06/26/2020 on evaluation today patient appears to be doing well in regard to her wound currently. Fortunately there is no signs of active infection in general I feel like she is doing excellent. This appears to be completely healed I think she is ready to come out of the cast. 11/29; patient comes back in the clinic today with a very quick reopening in the exact same area on the medial plantar foot. She had been healed out last time. She went back into some new balance shoes that she got at [employer] with a custom insert. As it turns out this wound also happened when wearing these shoes although there was some modification made I think with the wound initially happened they added some foam around the wound area. This obviously is not going to be sufficient. 07/17/2020 on evaluation today patient appears to be doing well with regard to her wound. Fortunately she  seems to be making good progress. Unfortunately she was in the hospital due to a issue with colitis and had just been discharged today in fact. She tells me that her biggest concern here is that a lot of her numbers especially her creatinine were somewhat elevated and problematic. She is can be following up with her provider in order to have a further work-up at this time. With that being said she did need to come back in for a cast change. She did not allow them to remove the cast due to the fact that she did not feel like that was the issue whatsoever and indeed it does not appear that was the case her wound appears to be doing excellent today. Objective Constitutional Well-nourished and well-hydrated in no acute distress. Vitals Time Taken: 3:42 PM, Height: 66 in, Weight: 245 lbs, BMI: 39.5, Temperature: 98.7 F, Pulse: 111 bpm, Respiratory Rate: 18 breaths/min, Blood Pressure: 173/72 mmHg, Capillary Blood Glucose: 140 mg/dl. Respiratory normal breathing without difficulty. Psychiatric this patient is able to make decisions and demonstrates good insight into disease process. Alert and Oriented x 3. pleasant and cooperative. General Notes: Upon inspection patient's wound bed actually showed signs of good granulation at this time. There does not appear to be any evidence of active infection which is great news in regard to the wound. She does seem very tired and worn out obviously after being in the hospital for an extended period of time 07/12/2020 through 07/17/2020 and all the work-up she had in between she is understandably worn out. Integumentary (Hair, Skin) Wound #1R status is Open. Original cause of wound was Gradually Appeared. The wound is located on the [place]. The wound measures 0.2cm length x 0.4cm width x 0.1cm depth; 0.063cm^2 area and 0.006cm^3 volume. There is Fat Layer (Subcutaneous Tissue) exposed. There is no tunneling or undermining noted. There is a medium  amount of serous drainage noted. The wound margin is thickened. There is medium (34-66%) pink granulation within the wound bed. There is a medium (34-66%) amount of necrotic tissue within the wound bed including Adherent Slough. Assessment Active Problems ICD-10 Type 2 diabetes mellitus with foot ulcer Non-pressure chronic ulcer of other part of left foot with fat layer exposed Charcot's joint, left ankle and foot Type 2 diabetes mellitus with diabetic neuropathy, unspecified Procedures Wound #1R Pre-procedure diagnosis of Wound #1R is a Diabetic Wound/Ulcer of the Lower Extremity located on the Left,Medial,Plantar Foot .Severity of Tissue Pre Debridement is: Fat layer exposed. There was a Selective/Open Wound Skin/Epidermis  Debridement with a total area of 1 sq cm performed by Worthy Keeler, PA. With the following instrument(s): Curette to remove Non-Viable tissue/material. Material removed includes Callus and Skin: Epidermis and after achieving pain control using Other (benzocaine 20% spray). No specimens were taken. A time out was conducted at 16:25, prior to the start of the procedure. There was no bleeding. The procedure was tolerated well with a pain level of 0 throughout and a pain level of 0 following the procedure. Post Debridement Measurements: 0.2cm length x 0.4cm width x 0.1cm depth; 0.006cm^3 volume. Character of Wound/Ulcer Post Debridement is improved. Severity of Tissue Post Debridement is: Fat layer exposed. Post procedure Diagnosis Wound #1R: Same as Pre-Procedure Pre-procedure diagnosis of Wound #1R is a Diabetic Wound/Ulcer of the Lower Extremity located on the Left,Medial,Plantar Foot . There was a T Contact otal Cast Procedure by Worthy Keeler, PA. Post procedure Diagnosis Wound #1R: Same as Pre-Procedure Plan Follow-up Appointments: Return Appointment in 1 week. Bathing/ Shower/ Hygiene: May shower with protection but do not get wound dressing(s) wet. Edema  Control - Lymphedema / SCD / Other: Elevate legs to the level of the heart or above for 30 minutes daily and/or when sitting, a frequency of: Avoid standing for long periods of time. Patient to wear own compression stockings every day. - right leg daily Off-Loading: T Contact Cast to Left Lower Extremity otal The following medication(s) was prescribed: benzocaine topical 20 % aerosol 0 0 aerosol topical for prior to debridement was prescribed at facility WOUND #1R: - Foot Wound Laterality: Plantar, Left, Medial Prim Dressing: Hydrofera Blue Classic Foam, 2x2 in ary Discharge Instructions: Moisten with saline prior to applying to wound bed Secondary Dressing: Woven Gauze Sponges 2x2 in Discharge Instructions: Apply over primary dressing as directed. Secondary Dressing: Optifoam Non-Adhesive Dressing, 4x4 in Discharge Instructions: Apply over primary dressing cut to make a foam donut Secured With: 53M Medipore H Soft Cloth Surgical T 4 x 2 (in/yd) ape Discharge Instructions: Secure dressing with tape as directed. 1. I would recommend currently that we go ahead and continue with the total contact cast that does seem to be helpful for her and to be honest I think it is done the best out of everything is for her to try to get the wound VAC under control here. 2. I am also going to recommend at this point that the patient continue to elevate her legs much as possible. Obviously the more this she can do the better. 3. I am also can I suggest that the patient continue to monitor for any signs of worsening infection. If she has any pain in her foot or otherwise areas of concern or she should let us know soon as possible. We will see patient back for reevaluation in 1 week here in the clinic. If anything worsens or changes patient will contact our office for additional recommendations. Electronic Signature(s) Signed: 07/17/2020 4:38:15 PM By: Worthy Keeler PA-C Entered By: Worthy Keeler on  07/17/2020 16:38:14 -------------------------------------------------------------------------------- Total Contact Cast Details Patient Name: Date of Service: [name] [name], [name] E. 07/17/2020 3:00 PM Medical Record Number: [id number] Patient Account Number: [id number] Date of Birth/Sex: Treating RN: October 08, 1945 (74 y.o. [name] Other Clinician: Referring Provider: Treating Provider/Extender: Adele Schilder in Treatment: 26 T Contact Cast Applied for Wound Assessment: otal Wound #1R Left,Medial,Plantar Foot Performed By: Physician Worthy Keeler, PA Post Procedure Diagnosis Same as Pre-procedure Electronic Signature(s)  Signed: 07/17/2020 6:12:31 PM By: [name] RN, BSN Signed: 07/18/2020 1:30:16 PM By: Worthy Keeler PA-C Entered By: [name] on 07/17/2020 16:29:26 -------------------------------------------------------------------------------- SuperBill Details Patient Name: Date of Service: [name] Guarneri RBA RA E. 07/17/2020 Medical Record Number: [id number] Patient Account Number: [id number] Date of Birth/Sex: Treating RN: Aug 27, 1945 (74 y.o. [name], [name] Primary Care Provider: [name] Other Clinician: Referring Provider: Treating Provider/Extender: Adele Schilder in Treatment: 26 Diagnosis Coding ICD-10 Codes Code Description E11.621 Type 2 diabetes mellitus with foot ulcer L97.522 Non-pressure chronic ulcer of other part of left foot with fat layer exposed M14.672 Charcot's joint, left ankle and foot E11.40 Type 2 diabetes mellitus with diabetic neuropathy, unspecified Facility Procedures CPT4 Code: [id number] Description: [id number] - DEBRIDE WOUND 1ST 20 SQ CM OR < ICD-10 Diagnosis Description L97.522 Non-pressure chronic ulcer of other part of left foot with fat layer exposed Modifier: Quantity: 1 Physician Procedures : CPT4 Code Description Modifier [id number] - WC  PHYS DEBR WO ANESTH 20 SQ CM ICD-10 Diagnosis Description L97.522 Non-pressure chronic ulcer of other part of left foot with fat layer exposed Quantity: 1 Electronic Signature(s) Signed: 07/17/2020 4:38:22 PM By: Worthy Keeler PA-C Entered By: Worthy Keeler on 07/17/2020 16:38:21

## 2020-07-17 NOTE — TOC Transition Note (Signed)
Transition of Care [facility]) - CM/SW Discharge Note   Patient Details  Name: [name] [name] MRN: [id number] Date of Birth: 10/25/1945  Transition of Care [facility]) CM/SW Contact:  Carles Collet, RN Phone Number: 07/17/2020, 12:10 PM   Clinical Narrative:   Patient to DC to home. [place] services have arranged through [facility]. No DME needs, no other CM needs identified.     Final next level of care: [place] Barriers to Discharge: No Barriers Identified   Patient Goals and CMS Choice Patient states their goals for this hospitalization and ongoing recovery are:: to go home CMS Medicare.gov Compare Post Acute Care list provided to:: Patient Choice offered to / list presented to : Patient  Discharge Placement                       Discharge Plan and Services   Discharge Planning Services: CM Consult Post Acute Care Choice: Home Health          DME Arranged: N/A         HH Arranged: PT, OT [place] Agency: [place] Date [place]: 07/17/20 Time [place]: 1209 Representative spoke with at [place]: [place] ([place]) Interventions     Readmission Risk Interventions No flowsheet data found.

## 2020-07-17 NOTE — Progress Notes (Signed)
Inpatient Diabetes Program Recommendations  AACE/ADA: New Consensus Statement on Inpatient Glycemic Control   Target Ranges:  Prepandial:   less than 140 mg/dL      Peak postprandial:   less than 180 mg/dL (1-2 hours)      Critically ill patients:  140 - 180 mg/dL   Results for TRULY, STANKIEWICZ (MRN [id number]) as of 07/17/2020 11:17  Ref. Range 07/16/2020 08:53 07/16/2020 11:42 07/16/2020 16:28 07/16/2020 21:24 07/17/2020 03:08 07/17/2020 07:25  Glucose-Capillary Latest Ref Range: 70 - 99 mg/dL 147 (H) 334 (H) 311 (H) 209 (H) 181 (H) 147 (H)   Review of Glycemic Control  Diabetes history: DM2 Outpatient Diabetes medications: Levemir 20 units BID, Metformin XR 1000 mg BID Current orders for Inpatient glycemic control: Levemir 20 units BID, Novolog 0-15 units TID with meals, Novolog 0-5 units QHS, Novolog 8 units TID with meals  Inpatient Diabetes Program Recommendations:    Insulin: Please consider increasing meal coverage to Novolog 10 units TID with meals if patient eats at least 50% of meals.  Thanks, Barnie Alderman, RN, MSN, CDE Diabetes Coordinator Inpatient Diabetes Program [phone] (Team Pager from 8am to 5pm)

## 2020-07-18 NOTE — Progress Notes (Signed)
AVERYANA, PILLARS ([id number]) Visit Report for 07/17/2020 Arrival Information Details Patient Name: Date of Service: [name] [name], [name] E. 07/17/2020 3:00 PM Medical Record Number: [id number] Patient Account Number: [id number] Date of Birth/Sex: Treating RN: 04-15-1946 (74 y.o. [name], Lauren Primary Care Iza Preston: [name] Other Clinician: Referring Rilla Buckman: Treating Jaben Benegas/Extender: Adele Schilder in Treatment: 26 Visit Information History Since Last Visit Added or deleted any medications: No Patient Arrived: Wheel Chair Any new allergies or adverse reactions: No Arrival Time: 15:41 Had a fall or experienced change in No Accompanied By: self activities of daily living that may affect Transfer Assistance: None risk of falls: Patient Identification Verified: Yes Signs or symptoms of abuse/neglect since last visito No Secondary Verification Process Completed: Yes Hospitalized since last visit: Yes Patient Requires Transmission-Based Precautions: No Implantable device outside of the clinic excluding No Patient Has Alerts: No cellular tissue based products placed in the center since last visit: Has Dressing in Place as Prescribed: Yes Pain Present Now: No Electronic Signature(s) Signed: 07/18/2020 3:27:39 PM By: Rhae Hammock RN Entered By: Rhae Hammock on 07/17/2020 15:42:16 -------------------------------------------------------------------------------- Encounter Discharge Information Details Patient Name: Date of Service: [name] [name] RBA RA E. 07/17/2020 3:00 PM Medical Record Number: [id number] Patient Account Number: [id number] Date of Birth/Sex: Treating RN: 20-May-1946 (74 y.o. [name] Primary Care Alan Riles: [name] Other Clinician: Referring Uchenna Rappaport: Treating Keion Neels/Extender: Adele Schilder in Treatment: 26 Encounter Discharge Information Items Post Procedure Vitals Discharge Condition:  Stable Temperature (F): 98.7 Ambulatory Status: Wheelchair Pulse (bpm): 111 Discharge Destination: Home Respiratory Rate (breaths/min): 18 Transportation: Private Auto Blood Pressure (mmHg): 173/72 Accompanied By: self Schedule Follow-up Appointment: Yes Clinical Summary of Care: Patient Declined Electronic Signature(s) Signed: 07/17/2020 5:41:55 PM By: Carlene Coria RN Entered By: Carlene Coria on 07/17/2020 17:04:38 -------------------------------------------------------------------------------- Lower Extremity Assessment Details Patient Name: Date of Service: [name] [name], [name] E. 07/17/2020 3:00 PM Medical Record Number: [id number] Patient Account Number: [id number] Date of Birth/Sex: Treating RN: 1946-06-02 (74 y.o. [name], Lauren Primary Care Gustavus Haskin: [name] Other Clinician: Referring Blimy Napoleon: Treating Stacy Deshler/Extender: Landis Martins Weeks in Treatment: 26 Edema Assessment Assessed: [Left: Yes] [Right: No] Edema: [Left: N] [Right: o] Calf Left: Right: Point of Measurement: 35 cm From Medial Instep 37 cm Ankle Left: Right: Point of Measurement: 9 cm From Medial Instep 26 cm Vascular Assessment Pulses: Dorsalis Pedis Palpable: [Left:Yes] Posterior Tibial Palpable: [Left:Yes] Electronic Signature(s) Signed: 07/18/2020 3:27:39 PM By: Rhae Hammock RN Entered By: Rhae Hammock on 07/17/2020 15:50:45 -------------------------------------------------------------------------------- [place] Details Patient Name: Date of Service: [name] [name] 07/17/2020 3:00 PM Medical Record Number: [id number] Patient Account Number: [id number] Date of Birth/Sex: Treating RN: Mar 14, 1946 (74 y.o. [name] Primary Care Omaree Fuqua: [name] Other Clinician: Referring Aamina Skiff: Treating Spring San/Extender: Adele Schilder in Treatment: 26 Active Inactive Wound/Skin Impairment Nursing  Diagnoses: Impaired tissue integrity Knowledge deficit related to ulceration/compromised skin integrity Goals: Patient/caregiver will verbalize understanding of skin care regimen Date Initiated: 01/17/2020 Target Resolution Date: 07/26/2020 Goal Status: Active Ulcer/skin breakdown will have a volume reduction of 30% by week 4 Date Initiated: 01/17/2020 Date Inactivated: 02/14/2020 Target Resolution Date: 02/14/2020 Goal Status: Met Ulcer/skin breakdown will have a volume reduction of 50% by week 8 Date Initiated: 02/14/2020 Date Inactivated: 03/13/2020 Target Resolution Date: 03/13/2020 Goal Status: Met Ulcer/skin breakdown will have a volume reduction of 80% by week 12 Date Initiated: 03/13/2020 Date Inactivated: 04/10/2020 Target Resolution Date:  04/10/2020 Goal Status: Unmet Unmet Reason: infection Interventions: Assess patient/caregiver ability to obtain necessary supplies Assess patient/caregiver ability to perform ulcer/skin care regimen upon admission and as needed Assess ulceration(s) every visit Provide education on ulcer and skin care Treatment Activities: Skin care regimen initiated : 01/17/2020 Topical wound management initiated : 01/17/2020 Notes: Electronic Signature(s) Signed: 07/17/2020 6:12:31 PM By: Zenaida Deed RN, BSN Entered By: Zenaida Deed on 07/17/2020 16:28:02 -------------------------------------------------------------------------------- Pain Assessment Details Patient Name: Date of Service: [name] [name] RBA RA E. 07/17/2020 3:00 PM Medical Record Number: [id number] Patient Account Number: [id number] Date of Birth/Sex: Treating RN: [birth year] (74 y.o. Toniann Fail Primary Care Sho Salguero: Cheryll Cockayne Other Clinician: Referring Khrista Braun: Treating Anubis Fundora/Extender: Rickard Patience in Treatment: 26 Active Problems Location of Pain Severity and Description of Pain Patient Has Paino No Site Locations Rate the pain. Current Pain Level:  0 Pain Management and Medication Current Pain Management: Electronic Signature(s) Signed: 07/18/2020 3:27:39 PM By: Fonnie Mu RN Entered By: Fonnie Mu on 07/17/2020 15:42:31 -------------------------------------------------------------------------------- Patient/Caregiver Education Details Patient Name: Date of Service: [name] [place] RA E. 12/8/2021andnbsp3:00 PM Medical Record Number: [id number] Patient Account Number: [id number] Date of Birth/Gender: Treating RN: 06/26/1946 (74 y.o. [name] Primary Care Physician: Cheryll Cockayne Other Clinician: Referring Physician: Treating Physician/Extender: Rickard Patience in Treatment: 75 Education Assessment Education Provided To: Patient Education Topics Provided Offloading: Methods: Explain/Verbal Responses: Reinforcements needed, State content correctly Wound/Skin Impairment: Methods: Explain/Verbal Responses: Reinforcements needed, State content correctly Electronic Signature(s) Signed: 07/17/2020 6:12:31 PM By: Zenaida Deed RN, BSN Entered By: Zenaida Deed on 07/17/2020 16:29:05 -------------------------------------------------------------------------------- Wound Assessment Details Patient Name: Date of Service: [name] [name], [name] RA E. 07/17/2020 3:00 PM Medical Record Number: [id number] Patient Account Number: [id number] Date of Birth/Sex: Treating RN: 03-01-1946 (74 y.o. Ardis Rowan, Lauren Primary Care Rosalie Gelpi: Cheryll Cockayne Other Clinician: Referring [name]: Treating Lexa Coronado/Extender: Atlee Abide Weeks in Treatment: 26 Wound Status Wound Number: 1R Primary Diabetic Wound/Ulcer of the Lower Extremity Etiology: Wound Location: Left, Medial, Plantar Foot Wound Open Wounding Event: Gradually Appeared Status: Date Acquired: 05/11/2019 Comorbid Sleep Apnea, Deep Vein Thrombosis, Hypertension, Colitis, Type Weeks Of Treatment: 26 History: II Diabetes, Gout,  Osteoarthritis, Neuropathy Clustered Wound: No Wound Measurements Length: (cm) 0.2 Width: (cm) 0.4 Depth: (cm) 0.1 Area: (cm) 0.063 Volume: (cm) 0.006 % Reduction in Area: 93.8% % Reduction in Volume: 98.5% Epithelialization: Small (1-33%) Tunneling: No Undermining: No Wound Description Classification: Grade 2 Wound Margin: Thickened Exudate Amount: Medium Exudate Type: Serous Exudate Color: amber Wound Bed Granulation Amount: Medium (34-66%) Granulation Quality: Pink Necrotic Amount: Medium (34-66%) Necrotic Quality: Adherent Slough Foul Odor After Cleansing: No Slough/Fibrino No Exposed Structure Fascia Exposed: No Fat Layer (Subcutaneous Tissue) Exposed: Yes Tendon Exposed: No Muscle Exposed: No Joint Exposed: No Bone Exposed: No Treatment Notes Wound #1R (Foot) Wound Laterality: Plantar, Left, Medial Cleanser Peri-Wound Care Topical Primary Dressing Hydrofera Blue Classic Foam, 2x2 in Discharge Instruction: Moisten with saline prior to applying to wound bed Secondary Dressing Woven Gauze Sponges 2x2 in Discharge Instruction: Apply over primary dressing as directed. Optifoam Non-Adhesive Dressing, 4x4 in Discharge Instruction: Apply over primary dressing cut to make a foam donut Secured With 3M Medipore H Soft Cloth Surgical T 4 x 2 (in/yd) ape Discharge Instruction: Secure dressing with tape as directed. Compression Wrap Compression Stockings Add-Ons Electronic Signature(s) Signed: 07/18/2020 3:27:39 PM By: Fonnie Mu RN Entered By: Fonnie Mu on 07/17/2020 15:52:00 -------------------------------------------------------------------------------- Vitals Details Patient Name: Date of Service:  [name] [name], [name] RA E. 07/17/2020 3:00 PM Medical Record Number: [id number] Patient Account Number: [id number] Date of Birth/Sex: Treating RN: 1946-03-01 (74 y.o. [name], Lauren Primary Care Starlina Lapre: [name] Other Clinician: Referring  Sedalia Greeson: Treating Lashunta Frieden/Extender: Adele Schilder in Treatment: 26 Vital Signs Time Taken: 15:42 Temperature (F): 98.7 Height (in): 66 Pulse (bpm): 111 Weight (lbs): 245 Respiratory Rate (breaths/min): 18 Body Mass Index (BMI): 39.5 Blood Pressure (mmHg): 173/72 Capillary Blood Glucose (mg/dl): 140 Reference Range: 80 - 120 mg / dl Electronic Signature(s) Signed: 07/18/2020 3:27:39 PM By: Rhae Hammock RN Entered By: Rhae Hammock on 07/17/2020 15:46:19

## 2020-07-21 ENCOUNTER — Encounter: Payer: Self-pay | Admitting: Internal Medicine

## 2020-07-22 ENCOUNTER — Other Ambulatory Visit: Payer: Self-pay | Admitting: Internal Medicine

## 2020-07-22 ENCOUNTER — Other Ambulatory Visit: Payer: Self-pay

## 2020-07-22 MED ORDER — OMEPRAZOLE 40 MG PO CPDR
DELAYED_RELEASE_CAPSULE | ORAL | 1 refills | Status: DC
Start: 2020-07-22 — End: 2021-04-16

## 2020-07-22 NOTE — Progress Notes (Signed)
Subjective:    Patient ID: [name] [name], female    DOB: Oct 03, 1945, 74 y.o.   MRN: [id number]  HPI The patient is here for follow up from the hospital  Admitted 07/12/20 - 07/17/20  Recommendations for Outpatient Follow-up:  1. PCP Dr. Quay Burow in 1 week, please check BMP at follow-up, please assess edema/volume status at follow-up 2. [facility] Dr. Cristina Gong in 2 weeks 3. Home health services   ARF, hypovolemic shock, OSA, htn, DM, psoriasis, diabetic foot ulcer, DVT, lymphocytic-plasmacytic colitis    She went to the ED for weakness and dizziness, persistent nausea with some vomiting, unable to eat for about one week.   Hypovolemic shock, lactic acidosis, ARF, N/V: Improved with fluids Briefly required low dose norepinephrine in ICU  Likely d/t GI losses CT Abd/pelvis - unrevealing Blood cx and urine cx neg Cr improved from 3.2 -> 1.2 Some edema on d/c, lungs clear - d/c'd on lasix 20 mg qd x 5 days with extra K Lisinopril/hctz d/c'd Check BMP  Chronic left foot wound Not thought to be source of infection Follows with wound center  DM, type 2 CBG stable levemir resumed Metformin resumed at lower dose - can increase   H/o lymphocytic colitis Had diarrhea after miralax budesonise resumed and prn imodium F/u with [facility]   Her digestive system is better.  The day after thanksgiving she was nauseous and could not eat anything.  She is not eating very much.  She denies nausea as she had before.  BM have returned to almost normal.   For the past few days she has had some SOB - she feels labored if doing anything.  She is not able to do much because of her foot ulcer.   One day in the hospital her head felt funny.  That same day she was talking to a nurse and talking normally and then she was talking in [employer].  It was brief.  The other day when she was at home she looked at herself and her smile was a little off.  Nothing since then.     She has been  overstressed.     Increased swelling in right lower leg - it has never been this swollen.  She did hit the leg while in the hospital and she has some bruising on the lower leg.  Her left leg is not swollen.    Medications and allergies reviewed with patient and updated if appropriate.  Patient Active Problem List   Diagnosis Date Noted  . Shock ([place]) 07/13/2020  . ARF (acute renal failure) ([place]) 07/12/2020  . Increased anion gap metabolic acidosis 50/53/9767  . Fatigue 11/13/2019  . Dyslipidemia 11/12/2019  . Pityriasis lichenoides chronica 09/18/2019  . Nausea 09/18/2019  . Insomnia 08/07/2019  . Anxiety 08/07/2019  . Psoriatic arthritis ([place]) 08/07/2019  . H/O: gout 08/07/2019  . Recurrent UTI 08/07/2019  . Charcot's joint of left foot 08/07/2019  . Depression 08/07/2019  . Pneumonia due to COVID-19 virus 07/09/2019  . Physical deconditioning 07/08/2019  . COVID-19 07/07/2019  . Diabetic peripheral neuropathy ([place]) 04/10/2019  . Pain in left knee 10/13/2018  . Ischemic colitis ([place]) 10/06/2018  . Arthritis 07/21/2018  . Urinary incontinence 07/21/2018  . Diabetic foot ulcer ([place]) 07/13/2018  . Cellulitis of toe of left foot 05/02/2018  . DM2 (diabetes mellitus, type 2) ([place]) 05/02/2018  . Psoriasis 05/02/2018  . Osteoarthritis of knee 10/18/2017  . Chest pain 01/12/2015  . Right bundle  branch block 05/20/2014  . OSA (obstructive sleep apnea), BiPAP 03/12/2013  . Essential hypertension 03/12/2013  . Obesity 09/14/2012  . Hyponatremia 09/14/2012  . S/P right THA, AA 09/13/2012  . Deep venous thrombosis of lower extremity ([place]) 08/10/2010  . 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 acetaminophen (TYLENOL) 325 MG tablet Take 2 tablets (650 mg total) by mouth every 6 (six) hours as needed for mild pain or headache.    . allopurinol (ZYLOPRIM) 100 MG tablet Take 1 tablet (100 mg  total) by mouth 2 (two) times daily. 180 tablet 3  . ALPRAZolam (XANAX) 0.25 MG tablet Take 1 tablet (0.25 mg total) by mouth 2 (two) times daily as needed for anxiety. 20 tablet 0  . ascorbic acid (VITAMIN C) 500 MG tablet Take 500 mg by mouth at bedtime.    Marland Kitchen aspirin 81 MG tablet Take 81 mg by mouth See admin instructions. Every 3 days    . azelastine (ASTELIN) 0.1 % nasal spray Place 2 sprays into both nostrils 2 (two) times daily. Use in each nostril as directed (Patient taking differently: Place 2 sprays into both nostrils at bedtime. Use in each nostril as directed) 30 mL 12  . Budesonide 9 MG TB24 Take 9 mg by mouth daily.     . Certolizumab Pegol (CIMZIA [place]) Inject 400 mg into the skin every 30 (thirty) days. 200 mg on each side of stomach    . Cholecalciferol (VITAMIN D3) 1000 UNITS CAPS Take 1,000 Units by mouth 2 (two) times daily.     . citalopram (CELEXA) 40 MG tablet TAKE 1 TABLET BY MOUTH EVERY DAY (Patient taking differently: Take 40 mg by mouth daily.) 90 tablet 3  . Cyanocobalamin (VITAMIN B-12) 1000 MCG SUBL Place 1,000 mcg under the tongue daily.    . cyclobenzaprine (FLEXERIL) 10 MG tablet TAKE 1 TABLET BY MOUTH AT BEDTIME (Patient taking differently: Take 10 mg by mouth at bedtime.) 90 tablet 1  . folic acid (FOLVITE) 1 MG tablet TAKE 1 TABLET(1 MG) BY MOUTH DAILY (Patient taking differently: Take 1 mg by mouth daily.) 90 tablet 3  . Insulin Pen Needle (B-D UF III MINI PEN NEEDLES) 31G X 5 MM MISC Use daily for insulin pen 90 each 3  . LEVEMIR FLEXTOUCH 100 UNIT/ML FlexPen ADMINISTER 20 UNITS UNDER THE SKIN TWICE DAILY (Patient taking differently: Inject 20 Units into the skin 2 (two) times daily.) 20 mL 2  . lisinopril-hydrochlorothiazide (ZESTORETIC) 20-25 MG tablet Take 1 tablet by mouth daily.    Marland Kitchen loperamide (IMODIUM) 2 MG capsule Take 6 mg by mouth 2 (two) times daily.    . Melatonin 5 MG SUBL Place 5 mg under the tongue at bedtime.    . metFORMIN (GLUCOPHAGE-XR) 500 MG  24 hr tablet Take 1 tablet (500 mg total) by mouth 2 (two) times daily before a meal.    . methotrexate (RHEUMATREX) 2.5 MG tablet Take 20 mg by mouth every Friday.     . naftifine (NAFTIN) 1 % cream Apply 1 application topically daily as needed (rash).     Marland Kitchen omeprazole (PRILOSEC) 40 MG capsule TAKE 1 CAPSULE BY MOUTH EVERY DAY 90 capsule 1  . potassium chloride (KLOR-CON) 10 MEQ tablet TAKE 2 TABLETS(20 MEQ) BY MOUTH DAILY (Patient taking differently: Take 40 mEq by mouth daily.) 180 tablet 3  . prednisoLONE acetate (PRED FORTE) 1 % ophthalmic suspension Place 4 drops  into the right eye See admin instructions. 4 drops  daily for 1 week, 2 drops daily until post op. Surgery 07/11/20    . Probiotic Product (ALIGN PO) Take 1 capsule by mouth daily.    . rosuvastatin (CRESTOR) 5 MG tablet Take 1 tablet (5 mg total) by mouth daily. (Patient taking differently: Take 5 mg by mouth at bedtime.) 90 tablet 3  . triamcinolone cream (KENALOG) 0.1 % Apply 1 application topically 2 (two) times daily. (Patient taking differently: Apply 1 application topically 2 (two) times daily as needed (rash).) 30 g 0  . furosemide (LASIX) 20 MG tablet Take 1 tablet (20 mg total) by mouth daily for 5 days. Take lasix 20mg  daily for 5days, take and extra Potassium pill while you are on lasix (5days) 5 tablet 0   No current facility-administered medications on file prior to visit.    Past Medical History:  Diagnosis Date  . Arthritis   . Closed fracture of fifth metatarsal bone 05/13/2018  . Deep vein thrombosis ([place])    right calf - 05/2012   . Diabetes mellitus without complication (HCC)    diet controlled   . GERD (gastroesophageal reflux disease)   . Hyperlipidemia   . Hypertension    Ejection fraction =>55% Left ventricular systolic function is normal. Left ventricular wall motion is normal    . Lymphocytic colitis   . Neuropathy    diabetic - in bilateral feet   . Pityriasis lichenoides chronica   . Sleep apnea     bipap    Past Surgical History:  Procedure Laterality Date  . DILATION AND CURETTAGE OF UTERUS    . EYE SURGERY  07/2020  . HAMMER TOE SURGERY    . right hand surgery      due to blood infection   . torn meniscus repair      right knee   . TOTAL HIP ARTHROPLASTY  09/13/2012   Procedure: TOTAL HIP ARTHROPLASTY ANTERIOR APPROACH;  Surgeon: Mauri Pole, MD;  Location: [facility];  Service: Orthopedics;  Laterality: Right;    Social History   Socioeconomic History  . Marital status: Divorced    Spouse name: Not on file  . Number of children: Not on file  . Years of education: Not on file  . Highest education level: Not on file  Occupational History  . Not on file  Tobacco Use  . Smoking status: Never Smoker  . Smokeless tobacco: Never Used  Substance and Sexual Activity  . Alcohol use: Yes    Comment: occasional wine   . Drug use: No  . Sexual activity: Not on file  Other Topics Concern  . Not on file  Social History Narrative   She is widowed. She has 2 children, 2 grandchildren. She does drink occasional alcohol . There is no tobacco . She does not have a routine exercise program   Social Determinants of Health   Financial Resource Strain: Low Risk   . Difficulty of Paying Living Expenses: Not hard at all  Food Insecurity: No Food Insecurity  . Worried About Charity fundraiser in the Last Year: Never true  . Ran Out of Food in the Last Year: Never true  Transportation Needs: No Transportation Needs  . Lack of Transportation (Medical): No  . Lack of Transportation (Non-Medical): No  Physical Activity: Inactive  . Days of Exercise per Week: 0 days  . Minutes of Exercise per Session: 0 min  Stress: No Stress Concern  Present  . Feeling of Stress : Not at all  Social Connections: Unknown  . Frequency of Communication with Friends and Family: More than three times a week  . Frequency of Social Gatherings with Friends and Family: Once a week  . Attends Religious  Services: Patient refused  . Active Member of Clubs or Organizations: Patient refused  . Attends Archivist Meetings: Patient refused  . Marital Status: Widowed    Family History  Problem Relation Age of Onset  . Hypertension Mother     Review of Systems  Constitutional: Positive for appetite change (dec). Negative for fever (low grade yesterday).  Respiratory: Positive for shortness of breath and wheezing (mild). Negative for cough.   Cardiovascular: Positive for leg swelling. Negative for chest pain and palpitations.  Gastrointestinal: Positive for nausea (minimal). Negative for abdominal pain, blood in stool, constipation and diarrhea.  Genitourinary: Negative for dysuria.  Neurological: Positive for speech difficulty (while in hospital - transient - word salad) and headaches.       Objective:   Vitals:   07/23/20 1444  BP: (!) 146/78  Pulse: 97  Temp: 98.8 F (37.1 C)  SpO2: 98%   BP Readings from Last 3 Encounters:  07/23/20 (!) 146/78  07/17/20 (!) 155/78  07/12/20 (!) 98/52   Wt Readings from Last 3 Encounters:  07/12/20 249 lb (112.9 kg)  03/12/20 245 lb (111.1 kg)  01/12/20 245 lb (111.1 kg)   Body mass index is 40.19 kg/m.   Physical Exam    Constitutional: Appears well-developed and well-nourished. No distress.  HENT:  Head: Normocephalic and atraumatic.  Neck: Neck supple. No tracheal deviation present. No thyromegaly present.  No cervical lymphadenopathy Cardiovascular: Normal rate, regular rhythm and normal heart sounds.   No murmur heard.  1+ pitting RLE edema Pulmonary/Chest: Effort normal and breath sounds normal. No respiratory distress. No has no wheezes. No rales.  Skin: Skin is warm and dry. Not diaphoretic.  Psychiatric: Normal mood and affect. Behavior is normal.      Assessment & Plan:    See Problem List for Assessment and Plan of chronic medical problems.    This visit occurred during the SARS-CoV-2 public health  emergency.  Safety protocols were in place, including screening questions prior to the visit, additional usage of staff PPE, and extensive cleaning of exam room while observing appropriate contact time as indicated for disinfecting solutions.

## 2020-07-22 NOTE — Patient Instructions (Addendum)
  Blood work was ordered.     Medications changes include :   none    An ultrasound of your right leg was ordered.   Please followup in March as scheduled

## 2020-07-23 ENCOUNTER — Other Ambulatory Visit: Payer: Self-pay

## 2020-07-23 ENCOUNTER — Ambulatory Visit (INDEPENDENT_AMBULATORY_CARE_PROVIDER_SITE_OTHER): Payer: Medicare Other | Admitting: Internal Medicine

## 2020-07-23 ENCOUNTER — Encounter: Payer: Self-pay | Admitting: Internal Medicine

## 2020-07-23 VITALS — BP 146/78 | HR 97 | Temp 98.8°F | Ht 66.0 in

## 2020-07-23 DIAGNOSIS — I10 Essential (primary) hypertension: Secondary | ICD-10-CM | POA: Diagnosis not present

## 2020-07-23 DIAGNOSIS — M7989 Other specified soft tissue disorders: Secondary | ICD-10-CM | POA: Diagnosis not present

## 2020-07-23 DIAGNOSIS — N179 Acute kidney failure, unspecified: Secondary | ICD-10-CM | POA: Diagnosis not present

## 2020-07-23 DIAGNOSIS — R0602 Shortness of breath: Secondary | ICD-10-CM | POA: Diagnosis not present

## 2020-07-23 DIAGNOSIS — R479 Unspecified speech disturbances: Secondary | ICD-10-CM | POA: Diagnosis not present

## 2020-07-23 DIAGNOSIS — E1169 Type 2 diabetes mellitus with other specified complication: Secondary | ICD-10-CM

## 2020-07-23 LAB — BASIC METABOLIC PANEL
BUN: 16 mg/dL (ref 6–23)
CO2: 28 mEq/L (ref 19–32)
Calcium: 8.6 mg/dL (ref 8.4–10.5)
Chloride: 103 mEq/L (ref 96–112)
Creatinine, Ser: 0.7 mg/dL (ref 0.40–1.20)
GFR: 84.86 mL/min (ref >60.00)
Glucose, Bld: 125 mg/dL — ABNORMAL HIGH (ref 70–99)
Potassium: 4.1 mEq/L (ref 3.5–5.1)
Sodium: 139 mEq/L (ref 135–145)

## 2020-07-23 LAB — BRAIN NATRIURETIC PEPTIDE: Pro B Natriuretic peptide (BNP): 368 pg/mL — ABNORMAL HIGH (ref 0.0–100.0)

## 2020-07-23 NOTE — Assessment & Plan Note (Signed)
New problem Had a transient episode of difficulty speaking - word salad Had a later episode of ? Facial droop Discussed concern for possible TIA Discussed work up we should consider - this is overwhelming to her right now - will consider ordering some of these tests next week - she wants to get some of the more urgent things done first

## 2020-07-23 NOTE — Assessment & Plan Note (Signed)
Acute Episode of DOE Has RLE edema but no LLE edema, lungs clear Most likely this is related to deconditioning BMP, BNP

## 2020-07-23 NOTE — Assessment & Plan Note (Addendum)
Acute RLE edema - LLE w/o edema Completed 5 days of lasix 20 mg daily -- no improvement in swelling Concern for possible DVT given hospitalization and leg injury

## 2020-07-23 NOTE — Assessment & Plan Note (Signed)
While in hospital Improved with IVF Bmp today

## 2020-07-23 NOTE — Assessment & Plan Note (Signed)
Chronic Lab Results  Component Value Date   HGBA1C 7.9 (H) 07/13/2020   Sugars have been controlled at home since leaving the hospital Advised goal of sugars < 150 Continue levemir 20 units BID and metformin 500 mg BID

## 2020-07-23 NOTE — Assessment & Plan Note (Signed)
Chronic BP slightly elevated today Just finished lasix ( 5 days of 20 mg) Restarted lisinopril - hctz 20-25 mg daily- continue

## 2020-07-24 ENCOUNTER — Ambulatory Visit (HOSPITAL_COMMUNITY)
Admission: RE | Admit: 2020-07-24 | Discharge: 2020-07-24 | Disposition: A | Discharge status: Home / Self Care | Payer: Medicare Other | Source: Ambulatory Visit | Attending: Internal Medicine | Admitting: Internal Medicine

## 2020-07-24 ENCOUNTER — Encounter: Payer: Self-pay | Admitting: Internal Medicine

## 2020-07-24 ENCOUNTER — Telehealth: Payer: Self-pay

## 2020-07-24 ENCOUNTER — Encounter (HOSPITAL_BASED_OUTPATIENT_CLINIC_OR_DEPARTMENT_OTHER): Payer: Medicare Other | Admitting: Physician Assistant

## 2020-07-24 DIAGNOSIS — E1151 Type 2 diabetes mellitus with diabetic peripheral angiopathy without gangrene: Secondary | ICD-10-CM | POA: Diagnosis not present

## 2020-07-24 DIAGNOSIS — L97522 Non-pressure chronic ulcer of other part of left foot with fat layer exposed: Secondary | ICD-10-CM | POA: Diagnosis not present

## 2020-07-24 DIAGNOSIS — M7989 Other specified soft tissue disorders: Secondary | ICD-10-CM | POA: Insufficient documentation

## 2020-07-24 DIAGNOSIS — E11621 Type 2 diabetes mellitus with foot ulcer: Secondary | ICD-10-CM | POA: Diagnosis not present

## 2020-07-24 DIAGNOSIS — E1140 Type 2 diabetes mellitus with diabetic neuropathy, unspecified: Secondary | ICD-10-CM | POA: Diagnosis not present

## 2020-07-24 MED ORDER — ONDANSETRON 4 MG PO TBDP: 4.0000 mg | ORAL_TABLET | Freq: Three times a day (TID) | ORAL | 0 refills | Status: DC | PRN

## 2020-07-24 MED ORDER — FUROSEMIDE 20 MG PO TABS: 20.0000 mg | ORAL_TABLET | Freq: Every day | ORAL | 0 refills | Status: DC

## 2020-07-24 NOTE — Progress Notes (Addendum)
MAJESTIC, MOLONY ([id number]) Visit Report for 07/24/2020 Chief Complaint Document Details Patient Name: Date of Service: [name] [name], [name] [name] E. 07/24/2020 11:00 A M Medical Record Number: [id number] Patient Account Number: [id number] Date of Birth/Sex: Treating RN: Aug 27, 1945 (74 y.o. [name] Other Clinician: Referring Provider: Treating Provider/Extender: Adele Schilder in Treatment: 27 Information Obtained from: Patient Chief Complaint Left foot ulcer Electronic Signature(s) Signed: 07/24/2020 11:33:25 AM By: Worthy Keeler PA-C Entered By: Worthy Keeler on 07/24/2020 11:33:25 -------------------------------------------------------------------------------- Debridement Details Patient Name: Date of Service: [name] [name] E. 07/24/2020 11:00 A M Medical Record Number: [id number] Patient Account Number: [id number] Date of Birth/Sex: Treating RN: [birth year] (74 y.o. Helene Shoe, Tammi Klippel Primary Care Provider: [name] Other Clinician: Referring Provider: Treating Provider/Extender: Adele Schilder in Treatment: 27 Debridement Performed for Assessment: Wound #1R Left,Medial,Plantar Foot Performed By: Physician Worthy Keeler, PA Debridement Type: Debridement Severity of Tissue Pre Debridement: Fat layer exposed Level of Consciousness (Pre-procedure): Awake and Alert Pre-procedure Verification/Time Out Yes - 12:25 Taken: Start Time: 12:26 T Area Debrided (L x W): otal 0.3 (cm) x 0.3 (cm) = 0.09 (cm) Tissue and other material debrided: Non-Viable, Callus, Skin: Epidermis Level: Skin/Epidermis Debridement Description: Selective/Open Wound Instrument: Curette Bleeding: None End Time: 12:30 Procedural Pain: 0 Post Procedural Pain: 0 Response to Treatment: Procedure was tolerated well Level of Consciousness (Post- Awake and Alert procedure): Post Debridement Measurements of Total  Wound Length: (cm) 0.1 Width: (cm) 0.1 Depth: (cm) 0.1 Volume: (cm) 0.001 Character of Wound/Ulcer Post Debridement: Improved Severity of Tissue Post Debridement: Fat layer exposed Post Procedure Diagnosis Same as Pre-procedure Electronic Signature(s) Signed: 07/24/2020 1:05:14 PM By: Worthy Keeler PA-C Signed: 07/24/2020 5:09:50 PM By: Deon Pilling Entered By: Deon Pilling on 07/24/2020 12:31:26 -------------------------------------------------------------------------------- HPI Details Patient Name: Date of Service: [name] [name] E. 07/24/2020 11:00 A M Medical Record Number: [id number] Patient Account Number: [id number] Date of Birth/Sex: Treating RN: 11/10/1945 (74 y.o. [name] Other Clinician: Referring Provider: Treating Provider/Extender: Adele Schilder in Treatment: 27 History of Present Illness HPI Description: 01/17/2020 upon evaluation today patient presents for initial evaluation here in our clinic concerning issues she has been having with a left medial/plantar foot ulcer. This is actually been an issue for her since October 2020. She has been seeing Dr. Doran Durand for quite some time during that course. Fortunately there is no signs of active infection at this time. Or least no mention of this to have seen in general. With that being said unfortunately I do see some signs of erythema noted today that does have me concerned about the possibility of infection at this point in the surrounding area of the wound. There is also a warm to touch at the site which is somewhat concerning. Fortunately there is no evidence of systemic infection which is great news. The patient does have a history of diabetes mellitus type 2, Charcot foot which is what led to the wound, and hypertension. She notes that she was in a cast for some time with Dr. Doran Durand for about 8 weeks. During that time they were utilizing according to  the patient silver nitrate along with a foam doughnut and then Coban to secure in place in the cast in place. With that being said I do not have the actual records to review we are going to try to get a hold  of those unfortunately they would not flow over into care everywhere I did look today. She has been seeing Dr. Doran Durand and his physician assistant Larkin Ina up until the end of May and apparently is still seeing them on a regular basis every 2 weeks roughly. She has also tried Iodosorb without effect here. 01/24/2020 upon evaluation today patient actually appears to be doing quite well with regard to her wounds. She has been tolerating the dressing changes without complication. Fortunately there is no signs of active infection spreading which is good news. Her culture did show signs of Staph aureus I did place her on Augmentin due to the erythema surrounding the wound. With that being said the wound does appear to be doing better she has her longer walking cast/boot and I think that is actually good for her for the time being. I am considering reinitiating total contact cast when she gets back from vacation but next week she will actually be out of town at [employer] she knows not to get in the water but she still obviously is planning to enjoy herself she is going to take it easy on her foot however. 02/07/2020 upon evaluation today patient appears to be doing fairly well in regard to her ulcer on her foot. Fortunately there is no signs of severe infection at this time which is great news and overall very pleased in that regard. With that being said I do think that she could still benefit from a total contact cast. Nonetheless she is using her walking boot which at least provide some protection and that it prevents some of the friction occurring when she is ambulating. 02/14/2020 upon evaluation today patient appears to be doing well with regard to her foot ulcer. This is actually measuring a little bit  smaller yet again this week. Overall very pleased with where things stand and I do not see any signs of active infection at this time which is also good news. Since she is measuring better the patient has wanting to somewhat hold off on proceeding with the total contact cast which I think is reasonable at this point. 02/28/2020 on evaluation today patient appears to be doing well in general in regard to her wound although she has a lot of callus buildup as compared to last time I saw her. This is can require sharp debridement today. I do believe she really needs the total contact cast as well which we have discussed previous. 7/23; patient comes in for a total contact cast change 03/06/2020 on evaluation today patient appears to be doing quite well with regard to her wounds. Fortunately the wound bed is measuring smaller and looking much better there is little callus noted although there is some debridement necessary today. 03/13/2020 on evaluation today patient's wound actually appears to be doing excellent which is great news. With that being said unfortunately she is having some issues currently with her left leg where she does have cellulitis it appears. This may have come from an area that rubbed underneath the cast from last week that we noted we padded that area and it looks to be doing excellent at this point but nonetheless the leg was somewhat painful, swollen, and somewhat erythematous. She also had an elevated white blood cell count of 11.5 based on what I saw on looking at her records from the med center in [facility] from where she was seen yesterday. Unfortunately with [place] having a provider on vacation there was no one here in  the clinic in the afternoon when she called therefore she went to the ER as advised. Subsequently they did not cut off the cast as they did not have anyone from orthopedics there to do so and subsequently also did not have the ability to do the Doppler for evaluation of  DVT They recommended therefore given her dose of Eliquis as well as . Augmentin and sent her home to come see [place] today to have the cast taken off and then she is supposed to go back to have the study for DVT performed they are following. 03/20/2020 upon evaluation today patient appears to be doing well with regard to her foot all things considered we have not been able to use the total contact cast due to the infection that she had last week. She has been on the doxycycline and she had a 10-day supply of that I do believe that is helping and her leg appears to be doing better. With that being said there is fortunately no signs of active infection systemically at this time which is good news. No fevers, chills, nausea, vomiting, or diarrhea. 03/27/2020 upon evaluation today patient appears to be doing well with regard to her foot ulcer. There does not appear to be signs of active infection which is great news. Overall I am very pleased with where things stand at this point. 04/03/2020 upon evaluation today patient appears to be doing pretty well in regard to the overall appearance of her wound. Fortunately there is no signs of active infection at this time which is great news. No fevers, chills, nausea, vomiting, or diarrhea. With that being said she does have some blue-green drainage that actually is a little bit concerning to me for the possibility of Pseudomonas. I discussed that with the patient today. With that being said I do believe that we may be able to manage this however with the topical antibiotic cream as opposed to having to do anything oral especially since she seems to be doing so well with overall appearance of the wound. 04/10/2020 on evaluation today patient appears to be doing about the same roughly in regard to the overall size of her wound. With that being said she fortunately has not shown any signs of worsening overall which is good news. I do believe that she is doing a great job  trying to offload but again she may still do better with the cast. I do not see in the blue-green drainage that we noticed previously I do believe the gentamicin help in this regard. 04/17/2020 on evaluation today patient's wound appears to be doing about the same at this point. There is no significant improvement at this point. No fever chills noted. She is up for put the cast back on the day. That she states in a couple weeks she will need to have this off to go to a workshop. 04/24/2020 on evaluation today patient appears to be doing significantly better in regard to her wound. Fortunately there is no signs of active infection and overall feel like she is making great progress the cast seems to have done excellent for her. 05/01/2020 upon evaluation today patient presents for reevaluation she really does not appear to be doing too badly in regard to the actual wound on the left foot we have been managing. Unfortunately she has bilateral lower extremity edema with blisters between the webspace of her first and second toe on both feet. She has a tremendous amount of edema in the legs which  I think is where this is coming from it does not appear to be infected but nonetheless I do believe this is can be something that needs to be addressed today. Obviously this means we probably will not be putting the cast on at this point. She attributes this to the fact that she was sitting with her feet on the floor much longer during a conference last week she had a great time but unfortunately had a lot of complications as a result. 05/08/2020 upon evaluation today patient appears to be doing somewhat better in regard to her wounds at this time. Fortunately there is no signs of active infection which is great news. With that being said I do believe that the blisters have ruptured and unfortunately did not just reattach I will remove some of the blistered tissue today. With that being said I do think the wound itself  on the plantar aspect of left foot does need to have sharp debridement. 05/15/2020 upon evaluation today patient appears to be doing about the same in regard to her foot ulcer. Unfortunately in the past week her husband had a fall where he sustained a mild traumatic brain bleed. Fortunately he is doing better but being that he was in the hospital she had a walk on this a lot more. The wound does not appear to be any better is also not really appearing to be significantly worse which is good news. There is no signs of active infection at this time. 10/14; patient with a small diabetic wound on the medial part of her left foot. We have been using silver collagen a total contact cast making good progress. I think the patient had a series of blisters on her dorsal foot probably secondary to having her legs recumbent for 3 days while in a conference in [place]. We wrapped her leg last week these are all healed. We did not previously have her in compression on the right leg. 05/29/2020 upon evaluation today patient appears to be doing well with regard to the wound on the plantar aspect of her foot medially. This is measuring smaller and looking much better than last time I saw her. Again when I did see her last was 2 weeks back and the wound was significantly larger. I do believe the cast is helping and I believe the collagen is a good option for her. 06/05/2020 on evaluation today patient appears to be doing well with regard to her foot ulcer this is actually measuring significantly better and overall I feel like she is doing excellent. There is no signs of active infection at this time. 06/12/2020 upon evaluation today patient actually continues to show signs of good improvement which is excellent news. I am extremely pleased with how she seems to be progressing at this point in regard to her wound. There is still some depth to the wound but I do believe the collagen is helping her quite a bit. 06/19/2020  upon evaluation today patient appears to be doing well with regard to her plantar foot ulcer. She is actually making excellent progress and in fact this appears to be almost completely healed. With that being said I do believe that the patient is going to actually require 1 more week in the cast although after that I am hopeful she will be ready for discharge. 06/26/2020 on evaluation today patient appears to be doing well in regard to her wound currently. Fortunately there is no signs of active infection in general I feel like  she is doing excellent. This appears to be completely healed I think she is ready to come out of the cast. 11/29; patient comes back in the clinic today with a very quick reopening in the exact same area on the medial plantar foot. She had been healed out last time. She went back into some new balance shoes that she got at [employer] with a custom insert. As it turns out this wound also happened when wearing these shoes although there was some modification made I think with the wound initially happened they added some foam around the wound area. This obviously is not going to be sufficient. 07/17/2020 on evaluation today patient appears to be doing well with regard to her wound. Fortunately she seems to be making good progress. Unfortunately she was in the hospital due to a issue with colitis and had just been discharged today in fact. She tells me that her biggest concern here is that a lot of her numbers especially her creatinine were somewhat elevated and problematic. She is can be following up with her provider in order to have a further work-up at this time. With that being said she did need to come back in for a cast change. She did not allow them to remove the cast due to the fact that she did not feel like that was the issue whatsoever and indeed it does not appear that was the case her wound appears to be doing excellent today. 07/24/2020 upon evaluation today patient  appears to be doing well in regard to her foot ulcer. She still has a small opening but this is showing signs of excellent improvement overall but they were very close to complete resolution. No fevers, chills, nausea, vomiting, or diarrhea. Electronic Signature(s) Signed: 07/24/2020 12:57:20 PM By: Worthy Keeler PA-C Entered By: Worthy Keeler on 07/24/2020 12:57:20 -------------------------------------------------------------------------------- Physical Exam Details Patient Name: Date of Service: WING, GFELLER RBA [name] E. 07/24/2020 11:00 A M Medical Record Number: [id number] Patient Account Number: [id number] Date of Birth/Sex: Treating RN: [birth year] (74 y.o. [name] Other Clinician: Referring Provider: Treating Provider/Extender: Adele Schilder in Treatment: 16 Constitutional Well-nourished and well-hydrated in no acute distress. Respiratory normal breathing without difficulty. Psychiatric this patient is able to make decisions and demonstrates good insight into disease process. Alert and Oriented x 3. pleasant and cooperative. Notes Upon inspection patient's wound bed actually showed signs of minimal callus buildup around the edges of the wound I did actually remove this today carefully with a curette she tolerated that without complication post debridement wound bed appears to be doing much better which is great news Electronic Signature(s) Signed: 07/24/2020 12:57:35 PM By: Worthy Keeler PA-C Entered By: Worthy Keeler on 07/24/2020 12:57:35 -------------------------------------------------------------------------------- Physician Orders Details Patient Name: Date of Service: [place], [place] RBA [name] E. 07/24/2020 11:00 A M Medical Record Number: [id number] Patient Account Number: [id number] Date of Birth/Sex: Treating RN: May 31, 1946 (74 y.o. Helene Shoe, Meta.Reding Primary Care Provider: [name] Other  Clinician: Referring Provider: Treating Provider/Extender: Adele Schilder in Treatment: 66 Verbal / Phone Orders: No Diagnosis Coding ICD-10 Coding Code Description E11.621 Type 2 diabetes mellitus with foot ulcer L97.522 Non-pressure chronic ulcer of other part of left foot with fat layer exposed M14.672 Charcot's joint, left ankle and foot E11.40 Type 2 diabetes mellitus with diabetic neuropathy, unspecified Follow-up Appointments Return Appointment in 1 week. Bathing/ Shower/ Hygiene May shower with protection but do  not get wound dressing(s) wet. Edema Control - Lymphedema / SCD / Other Bilateral Lower Extremities Elevate legs to the level of the heart or above for 30 minutes daily and/or when sitting, a frequency of: Avoid standing for long periods of time. Patient to wear own compression stockings every day. - right leg daily Off-Loading Total Contact Cast to Left Lower Extremity Wound Treatment Wound #1R - Foot Wound Laterality: Plantar, Left, Medial Prim Dressing: Hydrofera Blue Classic Foam, 2x2 in ary Discharge Instructions: Moisten with saline prior to applying to wound bed Secondary Dressing: Woven Gauze Sponges 2x2 in Discharge Instructions: Apply over primary dressing as directed. Secondary Dressing: Optifoam Non-Adhesive Dressing, 4x4 in Discharge Instructions: Apply over primary dressing cut to make a foam donut Secured With: 2M Medipore H Soft Cloth Surgical T 4 x 2 (in/yd) ape Discharge Instructions: Secure dressing with tape as directed. Electronic Signature(s) Signed: 07/24/2020 1:05:14 PM By: Worthy Keeler PA-C Signed: 07/24/2020 5:09:50 PM By: Deon Pilling Entered By: Deon Pilling on 07/24/2020 12:33:59 -------------------------------------------------------------------------------- Problem List Details Patient Name: Date of Service: [name] [name] E. 07/24/2020 11:00 A M Medical Record Number: [id number] Patient Account  Number: [id number] Date of Birth/Sex: Treating RN: November 03, 1945 (74 y.o. [name] Other Clinician: Referring Provider: Treating Provider/Extender: Adele Schilder in Treatment: 27 Active Problems ICD-10 Encounter Code Description Active Date MDM Diagnosis E11.621 Type 2 diabetes mellitus with foot ulcer 01/17/2020 No Yes L97.522 Non-pressure chronic ulcer of other part of left foot with fat layer exposed 01/17/2020 No Yes M14.672 Charcot's joint, left ankle and foot 01/17/2020 No Yes E11.40 Type 2 diabetes mellitus with diabetic neuropathy, unspecified 01/17/2020 No Yes Inactive Problems ICD-10 Code Description Active Date Inactive Date I10 Essential (primary) hypertension 01/17/2020 01/17/2020 Resolved Problems Electronic Signature(s) Signed: 07/24/2020 11:33:19 AM By: Worthy Keeler PA-C Entered By: Worthy Keeler on 07/24/2020 11:33:18 -------------------------------------------------------------------------------- Progress Note Details Patient Name: Date of Service: [place], BA RBA [name] E. 07/24/2020 11:00 A M Medical Record Number: [id number] Patient Account Number: [id number] Date of Birth/Sex: Treating RN: May 17, 1946 (74 y.o. [name] Other Clinician: Referring Provider: Treating Provider/Extender: Adele Schilder in Treatment: 27 Subjective Chief Complaint Information obtained from Patient Left foot ulcer History of Present Illness (HPI) 01/17/2020 upon evaluation today patient presents for initial evaluation here in our clinic concerning issues she has been having with a left medial/plantar foot ulcer. This is actually been an issue for her since October 2020. She has been seeing Dr. Doran Durand for quite some time during that course. Fortunately there is no signs of active infection at this time. Or least no mention of this to have seen in general. With  that being said unfortunately I do see some signs of erythema noted today that does have me concerned about the possibility of infection at this point in the surrounding area of the wound. There is also a warm to touch at the site which is somewhat concerning. Fortunately there is no evidence of systemic infection which is great news. The patient does have a history of diabetes mellitus type 2, Charcot foot which is what led to the wound, and hypertension. She notes that she was in a cast for some time with Dr. Doran Durand for about 8 weeks. During that time they were utilizing according to the patient silver nitrate along with a foam doughnut and then Coban to secure in place in the cast  in place. With that being said I do not have the actual records to review we are going to try to get a hold of those unfortunately they would not flow over into care everywhere I did look today. She has been seeing Dr. Doran Durand and his physician assistant Larkin Ina up until the end of May and apparently is still seeing them on a regular basis every 2 weeks roughly. She has also tried Iodosorb without effect here. 01/24/2020 upon evaluation today patient actually appears to be doing quite well with regard to her wounds. She has been tolerating the dressing changes without complication. Fortunately there is no signs of active infection spreading which is good news. Her culture did show signs of Staph aureus I did place her on Augmentin due to the erythema surrounding the wound. With that being said the wound does appear to be doing better she has her longer walking cast/boot and I think that is actually good for her for the time being. I am considering reinitiating total contact cast when she gets back from vacation but next week she will actually be out of town at [employer] she knows not to get in the water but she still obviously is planning to enjoy herself she is going to take it easy on her foot however. 02/07/2020 upon  evaluation today patient appears to be doing fairly well in regard to her ulcer on her foot. Fortunately there is no signs of severe infection at this time which is great news and overall very pleased in that regard. With that being said I do think that she could still benefit from a total contact cast. Nonetheless she is using her walking boot which at least provide some protection and that it prevents some of the friction occurring when she is ambulating. 02/14/2020 upon evaluation today patient appears to be doing well with regard to her foot ulcer. This is actually measuring a little bit smaller yet again this week. Overall very pleased with where things stand and I do not see any signs of active infection at this time which is also good news. Since she is measuring better the patient has wanting to somewhat hold off on proceeding with the total contact cast which I think is reasonable at this point. 02/28/2020 on evaluation today patient appears to be doing well in general in regard to her wound although she has a lot of callus buildup as compared to last time I saw her. This is can require sharp debridement today. I do believe she really needs the total contact cast as well which we have discussed previous. 7/23; patient comes in for a total contact cast change 03/06/2020 on evaluation today patient appears to be doing quite well with regard to her wounds. Fortunately the wound bed is measuring smaller and looking much better there is little callus noted although there is some debridement necessary today. 03/13/2020 on evaluation today patient's wound actually appears to be doing excellent which is great news. With that being said unfortunately she is having some issues currently with her left leg where she does have cellulitis it appears. This may have come from an area that rubbed underneath the cast from last week that we noted we padded that area and it looks to be doing excellent at this point but  nonetheless the leg was somewhat painful, swollen, and somewhat erythematous. She also had an elevated white blood cell count of 11.5 based on what I saw on looking at her records from the  med center in [facility] from where she was seen yesterday. Unfortunately with [place] having a provider on vacation there was no one here in the clinic in the afternoon when she called therefore she went to the ER as advised. Subsequently they did not cut off the cast as they did not have anyone from orthopedics there to do so and subsequently also did not have the ability to do the Doppler for evaluation of DVT They recommended therefore given her dose of Eliquis as well as . Augmentin and sent her home to come see [place] today to have the cast taken off and then she is supposed to go back to have the study for DVT performed they are following. 03/20/2020 upon evaluation today patient appears to be doing well with regard to her foot all things considered we have not been able to use the total contact cast due to the infection that she had last week. She has been on the doxycycline and she had a 10-day supply of that I do believe that is helping and her leg appears to be doing better. With that being said there is fortunately no signs of active infection systemically at this time which is good news. No fevers, chills, nausea, vomiting, or diarrhea. 03/27/2020 upon evaluation today patient appears to be doing well with regard to her foot ulcer. There does not appear to be signs of active infection which is great news. Overall I am very pleased with where things stand at this point. 04/03/2020 upon evaluation today patient appears to be doing pretty well in regard to the overall appearance of her wound. Fortunately there is no signs of active infection at this time which is great news. No fevers, chills, nausea, vomiting, or diarrhea. With that being said she does have some blue-green drainage that actually is a little bit  concerning to me for the possibility of Pseudomonas. I discussed that with the patient today. With that being said I do believe that we may be able to manage this however with the topical antibiotic cream as opposed to having to do anything oral especially since she seems to be doing so well with overall appearance of the wound. 04/10/2020 on evaluation today patient appears to be doing about the same roughly in regard to the overall size of her wound. With that being said she fortunately has not shown any signs of worsening overall which is good news. I do believe that she is doing a great job trying to offload but again she may still do better with the cast. I do not see in the blue-green drainage that we noticed previously I do believe the gentamicin help in this regard. 04/17/2020 on evaluation today patient's wound appears to be doing about the same at this point. There is no significant improvement at this point. No fever chills noted. She is up for put the cast back on the day. That she states in a couple weeks she will need to have this off to go to a workshop. 04/24/2020 on evaluation today patient appears to be doing significantly better in regard to her wound. Fortunately there is no signs of active infection and overall feel like she is making great progress the cast seems to have done excellent for her. 05/01/2020 upon evaluation today patient presents for reevaluation she really does not appear to be doing too badly in regard to the actual wound on the left foot we have been managing. Unfortunately she has bilateral lower extremity edema with  blisters between the webspace of her first and second toe on both feet. She has a tremendous amount of edema in the legs which I think is where this is coming from it does not appear to be infected but nonetheless I do believe this is can be something that needs to be addressed today. Obviously this means we probably will not be putting the cast on at this  point. She attributes this to the fact that she was sitting with her feet on the floor much longer during a conference last week she had a great time but unfortunately had a lot of complications as a result. 05/08/2020 upon evaluation today patient appears to be doing somewhat better in regard to her wounds at this time. Fortunately there is no signs of active infection which is great news. With that being said I do believe that the blisters have ruptured and unfortunately did not just reattach I will remove some of the blistered tissue today. With that being said I do think the wound itself on the plantar aspect of left foot does need to have sharp debridement. 05/15/2020 upon evaluation today patient appears to be doing about the same in regard to her foot ulcer. Unfortunately in the past week her husband had a fall where he sustained a mild traumatic brain bleed. Fortunately he is doing better but being that he was in the hospital she had a walk on this a lot more. The wound does not appear to be any better is also not really appearing to be significantly worse which is good news. There is no signs of active infection at this time. 10/14; patient with a small diabetic wound on the medial part of her left foot. We have been using silver collagen a total contact cast making good progress. I think the patient had a series of blisters on her dorsal foot probably secondary to having her legs recumbent for 3 days while in a conference in [place]. We wrapped her leg last week these are all healed. We did not previously have her in compression on the right leg. 05/29/2020 upon evaluation today patient appears to be doing well with regard to the wound on the plantar aspect of her foot medially. This is measuring smaller and looking much better than last time I saw her. Again when I did see her last was 2 weeks back and the wound was significantly larger. I do believe the cast is helping and I believe the  collagen is a good option for her. 06/05/2020 on evaluation today patient appears to be doing well with regard to her foot ulcer this is actually measuring significantly better and overall I feel like she is doing excellent. There is no signs of active infection at this time. 06/12/2020 upon evaluation today patient actually continues to show signs of good improvement which is excellent news. I am extremely pleased with how she seems to be progressing at this point in regard to her wound. There is still some depth to the wound but I do believe the collagen is helping her quite a bit. 06/19/2020 upon evaluation today patient appears to be doing well with regard to her plantar foot ulcer. She is actually making excellent progress and in fact this appears to be almost completely healed. With that being said I do believe that the patient is going to actually require 1 more week in the cast although after that I am hopeful she will be ready for discharge. 06/26/2020 on evaluation today patient  appears to be doing well in regard to her wound currently. Fortunately there is no signs of active infection in general I feel like she is doing excellent. This appears to be completely healed I think she is ready to come out of the cast. 11/29; patient comes back in the clinic today with a very quick reopening in the exact same area on the medial plantar foot. She had been healed out last time. She went back into some new balance shoes that she got at [employer] with a custom insert. As it turns out this wound also happened when wearing these shoes although there was some modification made I think with the wound initially happened they added some foam around the wound area. This obviously is not going to be sufficient. 07/17/2020 on evaluation today patient appears to be doing well with regard to her wound. Fortunately she seems to be making good progress. Unfortunately she was in the hospital due to a issue with colitis  and had just been discharged today in fact. She tells me that her biggest concern here is that a lot of her numbers especially her creatinine were somewhat elevated and problematic. She is can be following up with her provider in order to have a further work-up at this time. With that being said she did need to come back in for a cast change. She did not allow them to remove the cast due to the fact that she did not feel like that was the issue whatsoever and indeed it does not appear that was the case her wound appears to be doing excellent today. 07/24/2020 upon evaluation today patient appears to be doing well in regard to her foot ulcer. She still has a small opening but this is showing signs of excellent improvement overall but they were very close to complete resolution. No fevers, chills, nausea, vomiting, or diarrhea. Objective Constitutional Well-nourished and well-hydrated in no acute distress. Vitals Time Taken: 11:50 AM, Height: 66 in, Weight: 245 lbs, BMI: 39.5, Temperature: 98.7 F, Pulse: 103 bpm, Respiratory Rate: 20 breaths/min, Blood Pressure: 157/88 mmHg, Capillary Blood Glucose: 140 mg/dl. Respiratory normal breathing without difficulty. Psychiatric this patient is able to make decisions and demonstrates good insight into disease process. Alert and Oriented x 3. pleasant and cooperative. General Notes: Upon inspection patient's wound bed actually showed signs of minimal callus buildup around the edges of the wound I did actually remove this today carefully with a curette she tolerated that without complication post debridement wound bed appears to be doing much better which is great news Integumentary (Hair, Skin) Wound #1R status is Open. Original cause of wound was Gradually Appeared. The wound is located on the [place]. The wound measures 0.1cm length x 0.1cm width x 0.1cm depth; 0.008cm^2 area and 0.001cm^3 volume. There is Fat Layer (Subcutaneous Tissue)  exposed. There is no tunneling or undermining noted. There is a small amount of serous drainage noted. The wound margin is thickened. There is large (67-100%) pink granulation within the wound bed. There is a small (1-33%) amount of necrotic tissue within the wound bed including Adherent Slough. Assessment Active Problems ICD-10 Type 2 diabetes mellitus with foot ulcer Non-pressure chronic ulcer of other part of left foot with fat layer exposed Charcot's joint, left ankle and foot Type 2 diabetes mellitus with diabetic neuropathy, unspecified Procedures Wound #1R Pre-procedure diagnosis of Wound #1R is a Diabetic Wound/Ulcer of the Lower Extremity located on the Left,Medial,Plantar Foot .Severity of Tissue Pre Debridement is: Fat  layer exposed. There was a Selective/Open Wound Skin/Epidermis Debridement with a total area of 0.09 sq cm performed by Worthy Keeler, PA. With the following instrument(s): Curette to remove Non-Viable tissue/material. Material removed includes Callus and Skin: Epidermis and. No specimens were taken. A time out was conducted at 12:25, prior to the start of the procedure. There was no bleeding. The procedure was tolerated well with a pain level of 0 throughout and a pain level of 0 following the procedure. Post Debridement Measurements: 0.1cm length x 0.1cm width x 0.1cm depth; 0.001cm^3 volume. Character of Wound/Ulcer Post Debridement is improved. Severity of Tissue Post Debridement is: Fat layer exposed. Post procedure Diagnosis Wound #1R: Same as Pre-Procedure Pre-procedure diagnosis of Wound #1R is a Diabetic Wound/Ulcer of the Lower Extremity located on the Left,Medial,Plantar Foot . There was a T Contact otal Cast Procedure by Worthy Keeler, PA. Post procedure Diagnosis Wound #1R: Same as Pre-Procedure Plan Follow-up Appointments: Return Appointment in 1 week. Bathing/ Shower/ Hygiene: May shower with protection but do not get wound dressing(s)  wet. Edema Control - Lymphedema / SCD / Other: Elevate legs to the level of the heart or above for 30 minutes daily and/or when sitting, a frequency of: Avoid standing for long periods of time. Patient to wear own compression stockings every day. - right leg daily Off-Loading: T Contact Cast to Left Lower Extremity otal WOUND #1R: - Foot Wound Laterality: Plantar, Left, Medial Prim Dressing: Hydrofera Blue Classic Foam, 2x2 in ary Discharge Instructions: Moisten with saline prior to applying to wound bed Secondary Dressing: Woven Gauze Sponges 2x2 in Discharge Instructions: Apply over primary dressing as directed. Secondary Dressing: Optifoam Non-Adhesive Dressing, 4x4 in Discharge Instructions: Apply over primary dressing cut to make a foam donut Secured With: 15M Medipore H Soft Cloth Surgical T 4 x 2 (in/yd) ape Discharge Instructions: Secure dressing with tape as directed. 1. Would recommend currently that we going to continue with the wound care measures as before and the patient is in agreement with the plan. This includes the Hydrofera Blue dressing which is done excellent followed by the foam doughnut to help with offloading and then the total contact cast. 2. I did reapply the total contact cast myself during the office visit today the patient tolerated that without complication post application she seems to be doing excellent. We will see patient back for reevaluation in 1 week here in the clinic. If anything worsens or changes patient will contact our office for additional recommendations. Electronic Signature(s) Signed: 07/24/2020 12:58:19 PM By: Worthy Keeler PA-C Entered By: Worthy Keeler on 07/24/2020 12:58:19 -------------------------------------------------------------------------------- Total Contact Cast Details Patient Name: Date of Service: ARRION, BURRUEL [name] E. 07/24/2020 11:00 A M Medical Record Number: [id number] Patient Account Number: [id number] Date of  Birth/Sex: Treating RN: Dec 13, 1945 (74 y.o. [name] Primary Care Provider: [name] Other Clinician: Referring Provider: Treating Provider/Extender: Adele Schilder in Treatment: 27 T Contact Cast Applied for Wound Assessment: otal Wound #1R Left,Medial,Plantar Foot Performed By: Physician Worthy Keeler, PA Post Procedure Diagnosis Same as Pre-procedure Electronic Signature(s) Signed: 07/24/2020 1:05:14 PM By: Worthy Keeler PA-C Signed: 07/24/2020 5:09:50 PM By: Deon Pilling Entered By: Deon Pilling on 07/24/2020 12:29:09 -------------------------------------------------------------------------------- SuperBill Details Patient Name: Date of Service: [name] [name] E. 07/24/2020 Medical Record Number: [id number] Patient Account Number: [id number] Date of Birth/Sex: Treating RN: May 24, 1946 (74 y.o. [name] Primary Care Provider: [name] Other Clinician: Referring Provider: Treating  Provider/Extender: Landis Martins Weeks in Treatment: 27 Diagnosis Coding ICD-10 Codes Code Description E11.621 Type 2 diabetes mellitus with foot ulcer L97.522 Non-pressure chronic ulcer of other part of left foot with fat layer exposed M14.672 Charcot's joint, left ankle and foot E11.40 Type 2 diabetes mellitus with diabetic neuropathy, unspecified Facility Procedures CPT4 Code: [id number] Description: [phone] - DEBRIDE WOUND 1ST 20 SQ CM OR < ICD-10 Diagnosis Description L97.522 Non-pressure chronic ulcer of other part of left foot with fat layer exposed Modifier: Quantity: 1 Physician Procedures : CPT4 Code Description Modifier [id number] - WC PHYS DEBR WO ANESTH 20 SQ CM ICD-10 Diagnosis Description L97.522 Non-pressure chronic ulcer of other part of left foot with fat layer exposed Quantity: 1 Electronic Signature(s) Signed: 07/24/2020 12:58:52 PM By: Worthy Keeler PA-C Entered By: Worthy Keeler on 07/24/2020 12:58:51

## 2020-07-24 NOTE — Progress Notes (Signed)
[name] [name], [name] [name] ([id number]) Visit Report for 07/24/2020 Arrival Information Details Patient Name: Date of Service: [name] [name], [name] [name] E. 07/24/2020 11:00 A M Medical Record Number: [id number] Patient Account Number: [id number] Date of Birth/Sex: Treating RN: 1946/03/04 (74 y.o. [name] [name] Primary Care Carson Bogden: [name] Other Clinician: Referring Shonda Mandarino: Treating Meiko Ives/Extender: Adele Schilder in Treatment: 27 Visit Information History Since Last Visit Added or deleted any medications: No Patient Arrived: Wheel Chair Any new allergies or adverse reactions: No Arrival Time: 11:50 Had a fall or experienced change in No Accompanied By: self activities of daily living that may affect Transfer Assistance: None risk of falls: Patient Identification Verified: Yes Signs or symptoms of abuse/neglect since last visito No Secondary Verification Process Completed: Yes Hospitalized since last visit: No Patient Requires Transmission-Based Precautions: No Implantable device outside of the clinic excluding No Patient Has Alerts: No cellular tissue based products placed in the center since last visit: Has Dressing in Place as Prescribed: Yes Has Footwear/Offloading in Place as Prescribed: Yes Left: T Contact Cast otal Pain Present Now: No Electronic Signature(s) Signed: 07/24/2020 5:09:50 PM By: Deon Pilling Entered By: Deon Pilling on 07/24/2020 12:00:03 -------------------------------------------------------------------------------- Lower Extremity Assessment Details Patient Name: Date of Service: [name] [name], [name] [name] E. 07/24/2020 11:00 A M Medical Record Number: [id number] Patient Account Number: [id number] Date of Birth/Sex: Treating RN: 01/06/1946 (74 y.o. [name] [name] Primary Care Addysyn Fern: [name] Other Clinician: Referring Khanh Tanori: Treating Soul Deveney/Extender: Landis Martins Weeks in Treatment: 27 Edema Assessment Assessed:  [Left: Yes] [Right: No] Edema: [Left: N] [Right: o] Calf Left: Right: Point of Measurement: 35 cm From Medial Instep 34 cm Ankle Left: Right: Point of Measurement: 9 cm From Medial Instep 24.5 cm Vascular Assessment Pulses: Dorsalis Pedis Palpable: [Left:Yes] Electronic Signature(s) Signed: 07/24/2020 5:09:50 PM By: Deon Pilling Entered By: Deon Pilling on 07/24/2020 12:00:54 -------------------------------------------------------------------------------- [place] Details Patient Name: Date of Service: [name] [name] [name] E. 07/24/2020 11:00 A M Medical Record Number: [id number] Patient Account Number: [id number] Date of Birth/Sex: Treating RN: 1946-08-06 (74 y.o. [name] [name] Primary Care Tynesha Free: [name] Other Clinician: Referring Mai Longnecker: Treating Rosario Duey/Extender: Adele Schilder in Treatment: 27 Active Inactive Wound/Skin Impairment Nursing Diagnoses: Impaired tissue integrity Knowledge deficit related to ulceration/compromised skin integrity Goals: Patient/caregiver will verbalize understanding of skin care regimen Date Initiated: 01/17/2020 Target Resolution Date: 08/21/2020 Goal Status: Active Ulcer/skin breakdown will have a volume reduction of 30% by week 4 Date Initiated: 01/17/2020 Date Inactivated: 02/14/2020 Target Resolution Date: 02/14/2020 Goal Status: Met Ulcer/skin breakdown will have a volume reduction of 50% by week 8 Date Initiated: 02/14/2020 Date Inactivated: 03/13/2020 Target Resolution Date: 03/13/2020 Goal Status: Met Ulcer/skin breakdown will have a volume reduction of 80% by week 12 Date Initiated: 03/13/2020 Date Inactivated: 04/10/2020 Target Resolution Date: 04/10/2020 Goal Status: Unmet Unmet Reason: infection Interventions: Assess patient/caregiver ability to obtain necessary supplies Assess patient/caregiver ability to perform ulcer/skin care regimen upon admission and as needed Assess  ulceration(s) every visit Provide education on ulcer and skin care Treatment Activities: Skin care regimen initiated : 01/17/2020 Topical wound management initiated : 01/17/2020 Notes: Electronic Signature(s) Signed: 07/24/2020 5:09:50 PM By: Deon Pilling Entered By: Deon Pilling on 07/24/2020 12:31:44 -------------------------------------------------------------------------------- Pain Assessment Details Patient Name: Date of Service: [name] [name], [name] [name] E. 07/24/2020 11:00 A M Medical Record Number: [id number] Patient Account Number: [id number] Date of Birth/Sex: Treating RN: [birth year] (74 y.o. [name] [name] Primary Care Myan Locatelli: Quay Burow,  [place].Kava Other Clinician: Referring Kelvyn Schunk: Treating Nadeem Romanoski/Extender: Adele Schilder in Treatment: 27 Active Problems Location of Pain Severity and Description of Pain Patient Has Paino No Site Locations Rate the pain. Current Pain Level: 0 Pain Management and Medication Current Pain Management: Medication: No Cold Application: No Rest: No Massage: No Activity: No T.[name]: No Heat Application: No Leg drop or elevation: No Is the Current Pain Management Adequate: Adequate How does your wound impact your activities of daily livingo Sleep: No Bathing: No Appetite: No Relationship With Others: No Bladder Continence: No Emotions: No Bowel Continence: No Work: No Toileting: No Drive: No Dressing: No Hobbies: No Electronic Signature(s) Signed: 07/24/2020 5:09:50 PM By: Deon Pilling Entered By: Deon Pilling on 07/24/2020 12:00:43 -------------------------------------------------------------------------------- Patient/Caregiver Education Details Patient Name: Date of Service: [name] [name] [name] E. 12/15/2021andnbsp11:00 A M Medical Record Number: [id number] Patient Account Number: [id number] Date of Birth/Gender: Treating RN: 07-01-1946 (74 y.o. [name] [name] Primary Care Physician: [name] Other  Clinician: Referring Physician: Treating Physician/Extender: Adele Schilder in Treatment: 53 Education Assessment Education Provided To: Patient Education Topics Provided Offloading: Methods: Explain/Verbal Responses: Reinforcements needed, State content correctly Wound/Skin Impairment: Methods: Explain/Verbal Responses: Reinforcements needed, State content correctly Electronic Signature(s) Signed: 07/24/2020 5:09:50 PM By: Deon Pilling Entered By: Deon Pilling on 07/24/2020 12:32:03 -------------------------------------------------------------------------------- Wound Assessment Details Patient Name: Date of Service: [name] [name] [name] E. 07/24/2020 11:00 A M Medical Record Number: [id number] Patient Account Number: [id number] Date of Birth/Sex: Treating RN: [birth year] (74 y.o. Helene Shoe, Tammi Klippel Primary Care Gavrielle Streck: [name] Other Clinician: Referring Lilliona Blakeney: Treating Kenon Delashmit/Extender: Landis Martins Weeks in Treatment: 27 Wound Status Wound Number: [id number] Primary Diabetic Wound/Ulcer of the Lower Extremity Etiology: Wound Location: Left, Medial, Plantar Foot Wound Open Wounding Event: Gradually Appeared Status: Date Acquired: 05/11/2019 Comorbid Sleep Apnea, Deep Vein Thrombosis, Hypertension, Colitis, Type Weeks Of Treatment: 27 History: II Diabetes, Gout, Osteoarthritis, Neuropathy Clustered Wound: No Wound Measurements Length: (cm) 0.1 Width: (cm) 0.1 Depth: (cm) 0.1 Area: (cm) 0.008 Volume: (cm) 0.001 % Reduction in Area: 99.2% % Reduction in Volume: 99.8% Epithelialization: Large (67-100%) Tunneling: No Undermining: No Wound Description Classification: Grade 2 Wound Margin: Thickened Exudate Amount: Small Exudate Type: Serous Exudate Color: amber Foul Odor After Cleansing: No Slough/Fibrino Yes Wound Bed Granulation Amount: Large (67-100%) Exposed Structure Granulation Quality: Pink Fascia Exposed:  No Necrotic Amount: Small (1-33%) Fat Layer (Subcutaneous Tissue) Exposed: Yes Necrotic Quality: Adherent Slough Tendon Exposed: No Muscle Exposed: No Joint Exposed: No Bone Exposed: No Electronic Signature(s) Signed: 07/24/2020 5:09:50 PM By: Deon Pilling Entered By: Deon Pilling on 07/24/2020 12:01:15 -------------------------------------------------------------------------------- Vitals Details Patient Name: Date of Service: [name] [name] [name] E. 07/24/2020 11:00 A M Medical Record Number: [id number] Patient Account Number: [id number] Date of Birth/Sex: Treating RN: [birth year] (74 y.o. Helene Shoe, Tammi Klippel Primary Care Kadelyn Dimascio: [name] Other Clinician: Referring Camera Krienke: Treating Freedom Peddy/Extender: Adele Schilder in Treatment: 27 Vital Signs Time Taken: 11:50 Temperature (F): 98.7 Height (in): 66 Pulse (bpm): 103 Weight (lbs): 245 Respiratory Rate (breaths/min): 20 Body Mass Index (BMI): 39.5 Blood Pressure (mmHg): 157/88 Capillary Blood Glucose (mg/dl): 140 Reference Range: 80 - 120 mg / dl Electronic Signature(s) Signed: 07/24/2020 5:09:50 PM By: Deon Pilling Entered By: Deon Pilling on 07/24/2020 12:00:33

## 2020-07-24 NOTE — Telephone Encounter (Signed)
Let her know - no DVT. She has fluid in the soft tissue of the leg (swelling)

## 2020-07-25 DIAGNOSIS — L4059 Other psoriatic arthropathy: Secondary | ICD-10-CM | POA: Diagnosis not present

## 2020-07-25 NOTE — Telephone Encounter (Signed)
Spoke with patient today and info given. 

## 2020-07-26 DIAGNOSIS — E11621 Type 2 diabetes mellitus with foot ulcer: Secondary | ICD-10-CM | POA: Diagnosis not present

## 2020-07-26 DIAGNOSIS — K52832 Lymphocytic colitis: Secondary | ICD-10-CM | POA: Diagnosis not present

## 2020-07-26 DIAGNOSIS — I119 Hypertensive heart disease without heart failure: Secondary | ICD-10-CM | POA: Diagnosis not present

## 2020-07-26 DIAGNOSIS — G4733 Obstructive sleep apnea (adult) (pediatric): Secondary | ICD-10-CM | POA: Diagnosis not present

## 2020-07-26 DIAGNOSIS — L411 Pityriasis lichenoides chronica: Secondary | ICD-10-CM | POA: Diagnosis not present

## 2020-07-26 DIAGNOSIS — E1142 Type 2 diabetes mellitus with diabetic polyneuropathy: Secondary | ICD-10-CM | POA: Diagnosis not present

## 2020-07-26 DIAGNOSIS — L4050 Arthropathic psoriasis, unspecified: Secondary | ICD-10-CM | POA: Diagnosis not present

## 2020-07-26 DIAGNOSIS — K5730 Diverticulosis of large intestine without perforation or abscess without bleeding: Secondary | ICD-10-CM | POA: Diagnosis not present

## 2020-07-26 DIAGNOSIS — M109 Gout, unspecified: Secondary | ICD-10-CM | POA: Diagnosis not present

## 2020-07-26 DIAGNOSIS — M170 Bilateral primary osteoarthritis of knee: Secondary | ICD-10-CM | POA: Diagnosis not present

## 2020-07-26 DIAGNOSIS — Z794 Long term (current) use of insulin: Secondary | ICD-10-CM | POA: Diagnosis not present

## 2020-07-26 DIAGNOSIS — I959 Hypotension, unspecified: Secondary | ICD-10-CM | POA: Diagnosis not present

## 2020-07-26 DIAGNOSIS — Z7984 Long term (current) use of oral hypoglycemic drugs: Secondary | ICD-10-CM | POA: Diagnosis not present

## 2020-07-26 DIAGNOSIS — M069 Rheumatoid arthritis, unspecified: Secondary | ICD-10-CM | POA: Diagnosis not present

## 2020-07-26 DIAGNOSIS — E1165 Type 2 diabetes mellitus with hyperglycemia: Secondary | ICD-10-CM | POA: Diagnosis not present

## 2020-07-26 DIAGNOSIS — I4510 Unspecified right bundle-branch block: Secondary | ICD-10-CM | POA: Diagnosis not present

## 2020-07-26 DIAGNOSIS — D649 Anemia, unspecified: Secondary | ICD-10-CM | POA: Diagnosis not present

## 2020-07-26 DIAGNOSIS — E872 Acidosis: Secondary | ICD-10-CM | POA: Diagnosis not present

## 2020-07-26 DIAGNOSIS — E785 Hyperlipidemia, unspecified: Secondary | ICD-10-CM | POA: Diagnosis not present

## 2020-07-26 DIAGNOSIS — N179 Acute kidney failure, unspecified: Secondary | ICD-10-CM | POA: Diagnosis not present

## 2020-07-26 DIAGNOSIS — Z7982 Long term (current) use of aspirin: Secondary | ICD-10-CM | POA: Diagnosis not present

## 2020-07-26 DIAGNOSIS — L97529 Non-pressure chronic ulcer of other part of left foot with unspecified severity: Secondary | ICD-10-CM | POA: Diagnosis not present

## 2020-07-26 DIAGNOSIS — I700 Atherosclerosis of aorta: Secondary | ICD-10-CM | POA: Diagnosis not present

## 2020-07-26 DIAGNOSIS — Z79899 Other long term (current) drug therapy: Secondary | ICD-10-CM | POA: Diagnosis not present

## 2020-07-26 DIAGNOSIS — K219 Gastro-esophageal reflux disease without esophagitis: Secondary | ICD-10-CM | POA: Diagnosis not present

## 2020-07-29 ENCOUNTER — Other Ambulatory Visit: Payer: Self-pay

## 2020-07-29 ENCOUNTER — Encounter: Payer: Self-pay | Admitting: Internal Medicine

## 2020-07-29 ENCOUNTER — Ambulatory Visit (INDEPENDENT_AMBULATORY_CARE_PROVIDER_SITE_OTHER): Payer: Medicare Other | Admitting: Podiatry

## 2020-07-29 ENCOUNTER — Telehealth: Payer: Self-pay | Admitting: Internal Medicine

## 2020-07-29 ENCOUNTER — Encounter: Payer: Self-pay | Admitting: Podiatry

## 2020-07-29 DIAGNOSIS — L84 Corns and callosities: Secondary | ICD-10-CM

## 2020-07-29 DIAGNOSIS — M898X7 Other specified disorders of bone, ankle and foot: Secondary | ICD-10-CM

## 2020-07-29 DIAGNOSIS — I959 Hypotension, unspecified: Secondary | ICD-10-CM | POA: Diagnosis not present

## 2020-07-29 DIAGNOSIS — E11621 Type 2 diabetes mellitus with foot ulcer: Secondary | ICD-10-CM | POA: Diagnosis not present

## 2020-07-29 DIAGNOSIS — N179 Acute kidney failure, unspecified: Secondary | ICD-10-CM | POA: Diagnosis not present

## 2020-07-29 DIAGNOSIS — L03119 Cellulitis of unspecified part of limb: Secondary | ICD-10-CM

## 2020-07-29 DIAGNOSIS — E1142 Type 2 diabetes mellitus with diabetic polyneuropathy: Secondary | ICD-10-CM | POA: Diagnosis not present

## 2020-07-29 DIAGNOSIS — L02619 Cutaneous abscess of unspecified foot: Secondary | ICD-10-CM | POA: Diagnosis not present

## 2020-07-29 DIAGNOSIS — E1165 Type 2 diabetes mellitus with hyperglycemia: Secondary | ICD-10-CM | POA: Diagnosis not present

## 2020-07-29 DIAGNOSIS — L97529 Non-pressure chronic ulcer of other part of left foot with unspecified severity: Secondary | ICD-10-CM | POA: Diagnosis not present

## 2020-07-29 MED ORDER — CARVEDILOL 3.125 MG PO TABS: 3.1250 mg | ORAL_TABLET | Freq: Two times a day (BID) | ORAL | 3 refills | Status: DC

## 2020-07-29 NOTE — Progress Notes (Signed)
Subjective:   Patient ID: [name] [name], female   DOB: 74 y.o.   MRN: [id number]   HPI Patient presents stating that she has had a chronic ulcer for the last year and a half is been casted and is finally coming out and needs new diabetic shoes   ROS      Objective:  Physical Exam  Neurovascular status unchanged with patient in a cast left that is trying to hold the position and hopefully this will heal uneventfully and she can get into a diabetic shoe     Assessment:  Very difficult problem with history of ulceration that is finally healing left     Plan:  Discussed with [facility] orthotist condition have recommended long-term diabetic shoes and she is scheduled January to have these made hopefully we can prevent her from reulcerating

## 2020-07-29 NOTE — Telephone Encounter (Signed)
Message sent via mychart - start coreg 3.125 mg BID

## 2020-07-29 NOTE — Telephone Encounter (Signed)
[name] [name] from [facility]   Reporting a BP  First reading:184/94  Second reading: 174/94   Each reading was taken after a 5 min rest period.   Patient has no symptoms; patient feels fine  Best contact number is [phone]

## 2020-07-31 ENCOUNTER — Encounter (HOSPITAL_BASED_OUTPATIENT_CLINIC_OR_DEPARTMENT_OTHER): Payer: Medicare Other | Admitting: Physician Assistant

## 2020-07-31 ENCOUNTER — Other Ambulatory Visit: Payer: Self-pay

## 2020-07-31 DIAGNOSIS — E11621 Type 2 diabetes mellitus with foot ulcer: Secondary | ICD-10-CM | POA: Diagnosis not present

## 2020-07-31 DIAGNOSIS — E1140 Type 2 diabetes mellitus with diabetic neuropathy, unspecified: Secondary | ICD-10-CM | POA: Diagnosis not present

## 2020-07-31 DIAGNOSIS — E1151 Type 2 diabetes mellitus with diabetic peripheral angiopathy without gangrene: Secondary | ICD-10-CM | POA: Diagnosis not present

## 2020-07-31 DIAGNOSIS — L97522 Non-pressure chronic ulcer of other part of left foot with fat layer exposed: Secondary | ICD-10-CM | POA: Diagnosis not present

## 2020-07-31 NOTE — Progress Notes (Signed)
[name] [name], [name] [name] ([id number]) Visit Report for 07/31/2020 Chief Complaint Document Details Patient Name: Date of Service: [name] [name], [name] [name] 07/31/2020 12:45 PM Medical Record Number: [id number] Patient Account Number: [id number] Date of Birth/Sex: Treating RN: 1945/08/24 (74 y.o. [name] Primary Care Provider: [name] Other Clinician: Referring Provider: Treating Provider/Extender: Adele Schilder in Treatment: 28 Information Obtained from: Patient Chief Complaint Left foot ulcer Electronic Signature(s) Signed: 07/31/2020 1:11:49 PM By: Worthy Keeler PA-C Entered By: Worthy Keeler on 07/31/2020 13:11:49 -------------------------------------------------------------------------------- HPI Details Patient Name: Date of Service: [name] [name], [name] RA E. 07/31/2020 12:45 PM Medical Record Number: [id number] Patient Account Number: [id number] Date of Birth/Sex: Treating RN: October 03, 1945 (74 y.o. [name] Primary Care Provider: [name] Other Clinician: Referring Provider: Treating Provider/Extender: Adele Schilder in Treatment: 28 History of Present Illness HPI Description: 01/17/2020 upon evaluation today patient presents for initial evaluation here in our clinic concerning issues she has been having with a left medial/plantar foot ulcer. This is actually been an issue for her since October 2020. She has been seeing Dr. Doran Durand for quite some time during that course. Fortunately there is no signs of active infection at this time. Or least no mention of this to have seen in general. With that being said unfortunately I do see some signs of erythema noted today that does have me concerned about the possibility of infection at this point in the surrounding area of the wound. There is also a warm to touch at the site which is somewhat concerning. Fortunately there is no evidence of systemic infection which is great news. The patient  does have a history of diabetes mellitus type 2, Charcot foot which is what led to the wound, and hypertension. She notes that she was in a cast for some time with Dr. Doran Durand for about 8 weeks. During that time they were utilizing according to the patient silver nitrate along with a foam doughnut and then Coban to secure in place in the cast in place. With that being said I do not have the actual records to review we are going to try to get a hold of those unfortunately they would not flow over into care everywhere I did look today. She has been seeing Dr. Doran Durand and his physician assistant Larkin Ina up until the end of May and apparently is still seeing them on a regular basis every 2 weeks roughly. She has also tried Iodosorb without effect here. 01/24/2020 upon evaluation today patient actually appears to be doing quite well with regard to her wounds. She has been tolerating the dressing changes without complication. Fortunately there is no signs of active infection spreading which is good news. Her culture did show signs of Staph aureus I did place her on Augmentin due to the erythema surrounding the wound. With that being said the wound does appear to be doing better she has her longer walking cast/boot and I think that is actually good for her for the time being. I am considering reinitiating total contact cast when she gets back from vacation but next week she will actually be out of town at [employer] she knows not to get in the water but she still obviously is planning to enjoy herself she is going to take it easy on her foot however. 02/07/2020 upon evaluation today patient appears to be doing fairly well in regard to her ulcer on her foot. Fortunately there is no signs of  severe infection at this time which is great news and overall very pleased in that regard. With that being said I do think that she could still benefit from a total contact cast. Nonetheless she is using her walking boot which at  least provide some protection and that it prevents some of the friction occurring when she is ambulating. 02/14/2020 upon evaluation today patient appears to be doing well with regard to her foot ulcer. This is actually measuring a little bit smaller yet again this week. Overall very pleased with where things stand and I do not see any signs of active infection at this time which is also good news. Since she is measuring better the patient has wanting to somewhat hold off on proceeding with the total contact cast which I think is reasonable at this point. 02/28/2020 on evaluation today patient appears to be doing well in general in regard to her wound although she has a lot of callus buildup as compared to last time I saw her. This is can require sharp debridement today. I do believe she really needs the total contact cast as well which we have discussed previous. 7/23; patient comes in for a total contact cast change 03/06/2020 on evaluation today patient appears to be doing quite well with regard to her wounds. Fortunately the wound bed is measuring smaller and looking much better there is little callus noted although there is some debridement necessary today. 03/13/2020 on evaluation today patient's wound actually appears to be doing excellent which is great news. With that being said unfortunately she is having some issues currently with her left leg where she does have cellulitis it appears. This may have come from an area that rubbed underneath the cast from last week that we noted we padded that area and it looks to be doing excellent at this point but nonetheless the leg was somewhat painful, swollen, and somewhat erythematous. She also had an elevated white blood cell count of 11.5 based on what I saw on looking at her records from the med center in [place] from where she was seen yesterday. Unfortunately with [place] having a provider on vacation there was no one here in the clinic in the afternoon  when she called therefore she went to the ER as advised. Subsequently they did not cut off the cast as they did not have anyone from orthopedics there to do so and subsequently also did not have the ability to do the Doppler for evaluation of DVT They recommended therefore given her dose of Eliquis as well as . Augmentin and sent her home to come see [place] today to have the cast taken off and then she is supposed to go back to have the study for DVT performed they are following. 03/20/2020 upon evaluation today patient appears to be doing well with regard to her foot all things considered we have not been able to use the total contact cast due to the infection that she had last week. She has been on the doxycycline and she had a 10-day supply of that I do believe that is helping and her leg appears to be doing better. With that being said there is fortunately no signs of active infection systemically at this time which is good news. No fevers, chills, nausea, vomiting, or diarrhea. 03/27/2020 upon evaluation today patient appears to be doing well with regard to her foot ulcer. There does not appear to be signs of active infection which is great news. Overall I  am very pleased with where things stand at this point. 04/03/2020 upon evaluation today patient appears to be doing pretty well in regard to the overall appearance of her wound. Fortunately there is no signs of active infection at this time which is great news. No fevers, chills, nausea, vomiting, or diarrhea. With that being said she does have some blue-green drainage that actually is a little bit concerning to me for the possibility of Pseudomonas. I discussed that with the patient today. With that being said I do believe that we may be able to manage this however with the topical antibiotic cream as opposed to having to do anything oral especially since she seems to be doing so well with overall appearance of the wound. 04/10/2020 on evaluation  today patient appears to be doing about the same roughly in regard to the overall size of her wound. With that being said she fortunately has not shown any signs of worsening overall which is good news. I do believe that she is doing a great job trying to offload but again she may still do better with the cast. I do not see in the blue-green drainage that we noticed previously I do believe the gentamicin help in this regard. 04/17/2020 on evaluation today patient's wound appears to be doing about the same at this point. There is no significant improvement at this point. No fever chills noted. She is up for put the cast back on the day. That she states in a couple weeks she will need to have this off to go to a workshop. 04/24/2020 on evaluation today patient appears to be doing significantly better in regard to her wound. Fortunately there is no signs of active infection and overall feel like she is making great progress the cast seems to have done excellent for her. 05/01/2020 upon evaluation today patient presents for reevaluation she really does not appear to be doing too badly in regard to the actual wound on the left foot we have been managing. Unfortunately she has bilateral lower extremity edema with blisters between the webspace of her first and second toe on both feet. She has a tremendous amount of edema in the legs which I think is where this is coming from it does not appear to be infected but nonetheless I do believe this is can be something that needs to be addressed today. Obviously this means we probably will not be putting the cast on at this point. She attributes this to the fact that she was sitting with her feet on the floor much longer during a conference last week she had a great time but unfortunately had a lot of complications as a result. 05/08/2020 upon evaluation today patient appears to be doing somewhat better in regard to her wounds at this time. Fortunately there is no signs of  active infection which is great news. With that being said I do believe that the blisters have ruptured and unfortunately did not just reattach I will remove some of the blistered tissue today. With that being said I do think the wound itself on the plantar aspect of left foot does need to have sharp debridement. 05/15/2020 upon evaluation today patient appears to be doing about the same in regard to her foot ulcer. Unfortunately in the past week her husband had a fall where he sustained a mild traumatic brain bleed. Fortunately he is doing better but being that he was in the hospital she had a walk on this a lot more.  The wound does not appear to be any better is also not really appearing to be significantly worse which is good news. There is no signs of active infection at this time. 10/14; patient with a small diabetic wound on the medial part of her left foot. We have been using silver collagen a total contact cast making good progress. I think the patient had a series of blisters on her dorsal foot probably secondary to having her legs recumbent for 3 days while in a conference in [place]. We wrapped her leg last week these are all healed. We did not previously have her in compression on the right leg. 05/29/2020 upon evaluation today patient appears to be doing well with regard to the wound on the plantar aspect of her foot medially. This is measuring smaller and looking much better than last time I saw her. Again when I did see her last was 2 weeks back and the wound was significantly larger. I do believe the cast is helping and I believe the collagen is a good option for her. 06/05/2020 on evaluation today patient appears to be doing well with regard to her foot ulcer this is actually measuring significantly better and overall I feel like she is doing excellent. There is no signs of active infection at this time. 06/12/2020 upon evaluation today patient actually continues to show signs of good  improvement which is excellent news. I am extremely pleased with how she seems to be progressing at this point in regard to her wound. There is still some depth to the wound but I do believe the collagen is helping her quite a bit. 06/19/2020 upon evaluation today patient appears to be doing well with regard to her plantar foot ulcer. She is actually making excellent progress and in fact this appears to be almost completely healed. With that being said I do believe that the patient is going to actually require 1 more week in the cast although after that I am hopeful she will be ready for discharge. 06/26/2020 on evaluation today patient appears to be doing well in regard to her wound currently. Fortunately there is no signs of active infection in general I feel like she is doing excellent. This appears to be completely healed I think she is ready to come out of the cast. 11/29; patient comes back in the clinic today with a very quick reopening in the exact same area on the medial plantar foot. She had been healed out last time. She went back into some new balance shoes that she got at [employer] with a custom insert. As it turns out this wound also happened when wearing these shoes although there was some modification made I think with the wound initially happened they added some foam around the wound area. This obviously is not going to be sufficient. 07/17/2020 on evaluation today patient appears to be doing well with regard to her wound. Fortunately she seems to be making good progress. Unfortunately she was in the hospital due to a issue with colitis and had just been discharged today in fact. She tells me that her biggest concern here is that a lot of her numbers especially her creatinine were somewhat elevated and problematic. She is can be following up with her provider in order to have a further work-up at this time. With that being said she did need to come back in for a cast change. She did not  allow them to remove the cast due to the fact that  she did not feel like that was the issue whatsoever and indeed it does not appear that was the case her wound appears to be doing excellent today. 07/24/2020 upon evaluation today patient appears to be doing well in regard to her foot ulcer. She still has a small opening but this is showing signs of excellent improvement overall but they were very close to complete resolution. No fevers, chills, nausea, vomiting, or diarrhea. 07/31/2020 upon evaluation today patient actually appears to be doing excellent she is actually completely healed this is great news. Fortunately there is no signs of active infection at this time. No fevers, chills, nausea, vomiting, or diarrhea. The patient tells me that she did see Dr. Paulla Dolly in order to get to [place] so that she can have a custom shoe made. She saw him earlier last week. She does have an appointment with Liliane Channel on the sixth I believe on January she tells me Engineer, maintenance) Signed: 07/31/2020 2:18:53 PM By: Worthy Keeler PA-C Entered By: Worthy Keeler on 07/31/2020 14:18:53 -------------------------------------------------------------------------------- Physical Exam Details Patient Name: Date of Service: [name] [name], [name] E. 07/31/2020 12:45 PM Medical Record Number: [id number] Patient Account Number: [id number] Date of Birth/Sex: Treating RN: 1945-11-22 (74 y.o. [name] Primary Care Provider: [name] Other Clinician: Referring Provider: Treating Provider/Extender: Landis Martins Weeks in Treatment: 67 Constitutional Well-nourished and well-hydrated in no acute distress. Respiratory normal breathing without difficulty. Psychiatric this patient is able to make decisions and demonstrates good insight into disease process. Alert and Oriented x 3. pleasant and cooperative. Notes Upon inspection patient's wound actually showed signs of complete epithelization this is  great news and overall I am very pleased. I do think at this time that she is getting need to continue with her wheelchair to keep from walking on this in order to allow it to continue to toughen up and stay healed. With that being said I do believe that she needs to as much as possible try to avoid walking although if she absolutely needs to walk to do certain things that is okay. I did explain that regard to physical therapy I am okay with her actually participating in some physical therapy but the best thing may be to have her therapist call me in order to discuss what exactly we want her to be doing or not doing. Electronic Signature(s) Signed: 07/31/2020 2:19:41 PM By: Worthy Keeler PA-C Entered By: Worthy Keeler on 07/31/2020 14:19:41 -------------------------------------------------------------------------------- Physician Orders Details Patient Name: Date of Service: [name] [name], [name] [place] RBA RA E. 07/31/2020 12:45 PM Medical Record Number: [id number] Patient Account Number: [id number] Date of Birth/Sex: Treating RN: 1945-08-18 (74 y.o. [name] Primary Care Provider: [name] Other Clinician: Referring Provider: Treating Provider/Extender: Adele Schilder in Treatment: 14 Verbal / Phone Orders: No Diagnosis Coding ICD-10 Coding Code Description E11.621 Type 2 diabetes mellitus with foot ulcer L97.522 Non-pressure chronic ulcer of other part of left foot with fat layer exposed M14.672 Charcot's joint, left ankle and foot E11.40 Type 2 diabetes mellitus with diabetic neuropathy, unspecified Discharge From [facility] Services Discharge from [place] Bathing/ Shower/ Hygiene May shower and wash wound with soap and water. Off-Loading Other: - minimal weight bearing on left foot, no excessive walking or standing for longer than 5 minutes. Non Wound Condition Other Non Wound Condition Orders/Instructions: - Keep left foot protected with ABD pad or gauze and  kerlix. Electronic Signature(s) Signed: 07/31/2020 5:13:59 PM By: Joaquim Lai  Dixie Dials PA-C Signed: 07/31/2020 7:00:57 PM By: Levan Hurst RN, BSN Entered By: Levan Hurst on 07/31/2020 14:15:38 -------------------------------------------------------------------------------- Problem List Details Patient Name: Date of Service: [name] Guarneri RBA RA E. 07/31/2020 12:45 PM Medical Record Number: [id number] Patient Account Number: [id number] Date of Birth/Sex: Treating RN: 27-Aug-1945 (74 y.o. [name] Primary Care Provider: [name] Other Clinician: Referring Provider: Treating Provider/Extender: Adele Schilder in Treatment: 28 Active Problems ICD-10 Encounter Code Description Active Date MDM Diagnosis E11.621 Type 2 diabetes mellitus with foot ulcer 01/17/2020 No Yes L97.522 Non-pressure chronic ulcer of other part of left foot with fat layer exposed 01/17/2020 No Yes M14.672 Charcot's joint, left ankle and foot 01/17/2020 No Yes E11.40 Type 2 diabetes mellitus with diabetic neuropathy, unspecified 01/17/2020 No Yes Inactive Problems ICD-10 Code Description Active Date Inactive Date I10 Essential (primary) hypertension 01/17/2020 01/17/2020 Resolved Problems Electronic Signature(s) Signed: 07/31/2020 1:11:30 PM By: Worthy Keeler PA-C Entered By: Worthy Keeler on 07/31/2020 13:11:30 -------------------------------------------------------------------------------- Progress Note Details Patient Name: Date of Service: [name] [name], [name] 07/31/2020 12:45 PM Medical Record Number: [id number] Patient Account Number: [id number] Date of Birth/Sex: Treating RN: 04-04-1946 (74 y.o. [name] Primary Care Provider: [name] Other Clinician: Referring Provider: Treating Provider/Extender: Adele Schilder in Treatment: 28 Subjective Chief Complaint Information obtained from Patient Left foot ulcer History of Present Illness  (HPI) 01/17/2020 upon evaluation today patient presents for initial evaluation here in our clinic concerning issues she has been having with a left medial/plantar foot ulcer. This is actually been an issue for her since October 2020. She has been seeing Dr. Doran Durand for quite some time during that course. Fortunately there is no signs of active infection at this time. Or least no mention of this to have seen in general. With that being said unfortunately I do see some signs of erythema noted today that does have me concerned about the possibility of infection at this point in the surrounding area of the wound. There is also a warm to touch at the site which is somewhat concerning. Fortunately there is no evidence of systemic infection which is great news. The patient does have a history of diabetes mellitus type 2, Charcot foot which is what led to the wound, and hypertension. She notes that she was in a cast for some time with Dr. Doran Durand for about 8 weeks. During that time they were utilizing according to the patient silver nitrate along with a foam doughnut and then Coban to secure in place in the cast in place. With that being said I do not have the actual records to review we are going to try to get a hold of those unfortunately they would not flow over into care everywhere I did look today. She has been seeing Dr. Doran Durand and his physician assistant Larkin Ina up until the end of May and apparently is still seeing them on a regular basis every 2 weeks roughly. She has also tried Iodosorb without effect here. 01/24/2020 upon evaluation today patient actually appears to be doing quite well with regard to her wounds. She has been tolerating the dressing changes without complication. Fortunately there is no signs of active infection spreading which is good news. Her culture did show signs of Staph aureus I did place her on Augmentin due to the erythema surrounding the wound. With that being said the wound does  appear to be doing better she has her longer walking cast/boot and I  think that is actually good for her for the time being. I am considering reinitiating total contact cast when she gets back from vacation but next week she will actually be out of town at [employer] she knows not to get in the water but she still obviously is planning to enjoy herself she is going to take it easy on her foot however. 02/07/2020 upon evaluation today patient appears to be doing fairly well in regard to her ulcer on her foot. Fortunately there is no signs of severe infection at this time which is great news and overall very pleased in that regard. With that being said I do think that she could still benefit from a total contact cast. Nonetheless she is using her walking boot which at least provide some protection and that it prevents some of the friction occurring when she is ambulating. 02/14/2020 upon evaluation today patient appears to be doing well with regard to her foot ulcer. This is actually measuring a little bit smaller yet again this week. Overall very pleased with where things stand and I do not see any signs of active infection at this time which is also good news. Since she is measuring better the patient has wanting to somewhat hold off on proceeding with the total contact cast which I think is reasonable at this point. 02/28/2020 on evaluation today patient appears to be doing well in general in regard to her wound although she has a lot of callus buildup as compared to last time I saw her. This is can require sharp debridement today. I do believe she really needs the total contact cast as well which we have discussed previous. 7/23; patient comes in for a total contact cast change 03/06/2020 on evaluation today patient appears to be doing quite well with regard to her wounds. Fortunately the wound bed is measuring smaller and looking much better there is little callus noted although there is some debridement  necessary today. 03/13/2020 on evaluation today patient's wound actually appears to be doing excellent which is great news. With that being said unfortunately she is having some issues currently with her left leg where she does have cellulitis it appears. This may have come from an area that rubbed underneath the cast from last week that we noted we padded that area and it looks to be doing excellent at this point but nonetheless the leg was somewhat painful, swollen, and somewhat erythematous. She also had an elevated white blood cell count of 11.5 based on what I saw on looking at her records from the med center in [facility] from where she was seen yesterday. Unfortunately with [place] having a provider on vacation there was no one here in the clinic in the afternoon when she called therefore she went to the ER as advised. Subsequently they did not cut off the cast as they did not have anyone from orthopedics there to do so and subsequently also did not have the ability to do the Doppler for evaluation of DVT They recommended therefore given her dose of Eliquis as well as . Augmentin and sent her home to come see [place] today to have the cast taken off and then she is supposed to go back to have the study for DVT performed they are following. 03/20/2020 upon evaluation today patient appears to be doing well with regard to her foot all things considered we have not been able to use the total contact cast due to the infection that she had  last week. She has been on the doxycycline and she had a 10-day supply of that I do believe that is helping and her leg appears to be doing better. With that being said there is fortunately no signs of active infection systemically at this time which is good news. No fevers, chills, nausea, vomiting, or diarrhea. 03/27/2020 upon evaluation today patient appears to be doing well with regard to her foot ulcer. There does not appear to be signs of active infection which is great  news. Overall I am very pleased with where things stand at this point. 04/03/2020 upon evaluation today patient appears to be doing pretty well in regard to the overall appearance of her wound. Fortunately there is no signs of active infection at this time which is great news. No fevers, chills, nausea, vomiting, or diarrhea. With that being said she does have some blue-green drainage that actually is a little bit concerning to me for the possibility of Pseudomonas. I discussed that with the patient today. With that being said I do believe that we may be able to manage this however with the topical antibiotic cream as opposed to having to do anything oral especially since she seems to be doing so well with overall appearance of the wound. 04/10/2020 on evaluation today patient appears to be doing about the same roughly in regard to the overall size of her wound. With that being said she fortunately has not shown any signs of worsening overall which is good news. I do believe that she is doing a great job trying to offload but again she may still do better with the cast. I do not see in the blue-green drainage that we noticed previously I do believe the gentamicin help in this regard. 04/17/2020 on evaluation today patient's wound appears to be doing about the same at this point. There is no significant improvement at this point. No fever chills noted. She is up for put the cast back on the day. That she states in a couple weeks she will need to have this off to go to a workshop. 04/24/2020 on evaluation today patient appears to be doing significantly better in regard to her wound. Fortunately there is no signs of active infection and overall feel like she is making great progress the cast seems to have done excellent for her. 05/01/2020 upon evaluation today patient presents for reevaluation she really does not appear to be doing too badly in regard to the actual wound on the left foot we have been managing.  Unfortunately she has bilateral lower extremity edema with blisters between the webspace of her first and second toe on both feet. She has a tremendous amount of edema in the legs which I think is where this is coming from it does not appear to be infected but nonetheless I do believe this is can be something that needs to be addressed today. Obviously this means we probably will not be putting the cast on at this point. She attributes this to the fact that she was sitting with her feet on the floor much longer during a conference last week she had a great time but unfortunately had a lot of complications as a result. 05/08/2020 upon evaluation today patient appears to be doing somewhat better in regard to her wounds at this time. Fortunately there is no signs of active infection which is great news. With that being said I do believe that the blisters have ruptured and unfortunately did not just reattach I  will remove some of the blistered tissue today. With that being said I do think the wound itself on the plantar aspect of left foot does need to have sharp debridement. 05/15/2020 upon evaluation today patient appears to be doing about the same in regard to her foot ulcer. Unfortunately in the past week her husband had a fall where he sustained a mild traumatic brain bleed. Fortunately he is doing better but being that he was in the hospital she had a walk on this a lot more. The wound does not appear to be any better is also not really appearing to be significantly worse which is good news. There is no signs of active infection at this time. 10/14; patient with a small diabetic wound on the medial part of her left foot. We have been using silver collagen a total contact cast making good progress. I think the patient had a series of blisters on her dorsal foot probably secondary to having her legs recumbent for 3 days while in a conference in [place]. We wrapped her leg last week these are all healed.  We did not previously have her in compression on the right leg. 05/29/2020 upon evaluation today patient appears to be doing well with regard to the wound on the plantar aspect of her foot medially. This is measuring smaller and looking much better than last time I saw her. Again when I did see her last was 2 weeks back and the wound was significantly larger. I do believe the cast is helping and I believe the collagen is a good option for her. 06/05/2020 on evaluation today patient appears to be doing well with regard to her foot ulcer this is actually measuring significantly better and overall I feel like she is doing excellent. There is no signs of active infection at this time. 06/12/2020 upon evaluation today patient actually continues to show signs of good improvement which is excellent news. I am extremely pleased with how she seems to be progressing at this point in regard to her wound. There is still some depth to the wound but I do believe the collagen is helping her quite a bit. 06/19/2020 upon evaluation today patient appears to be doing well with regard to her plantar foot ulcer. She is actually making excellent progress and in fact this appears to be almost completely healed. With that being said I do believe that the patient is going to actually require 1 more week in the cast although after that I am hopeful she will be ready for discharge. 06/26/2020 on evaluation today patient appears to be doing well in regard to her wound currently. Fortunately there is no signs of active infection in general I feel like she is doing excellent. This appears to be completely healed I think she is ready to come out of the cast. 11/29; patient comes back in the clinic today with a very quick reopening in the exact same area on the medial plantar foot. She had been healed out last time. She went back into some new balance shoes that she got at [employer] with a custom insert. As it turns out this wound also  happened when wearing these shoes although there was some modification made I think with the wound initially happened they added some foam around the wound area. This obviously is not going to be sufficient. 07/17/2020 on evaluation today patient appears to be doing well with regard to her wound. Fortunately she seems to be making good progress. Unfortunately she  was in the hospital due to a issue with colitis and had just been discharged today in fact. She tells me that her biggest concern here is that a lot of her numbers especially her creatinine were somewhat elevated and problematic. She is can be following up with her provider in order to have a further work-up at this time. With that being said she did need to come back in for a cast change. She did not allow them to remove the cast due to the fact that she did not feel like that was the issue whatsoever and indeed it does not appear that was the case her wound appears to be doing excellent today. 07/24/2020 upon evaluation today patient appears to be doing well in regard to her foot ulcer. She still has a small opening but this is showing signs of excellent improvement overall but they were very close to complete resolution. No fevers, chills, nausea, vomiting, or diarrhea. 07/31/2020 upon evaluation today patient actually appears to be doing excellent she is actually completely healed this is great news. Fortunately there is no signs of active infection at this time. No fevers, chills, nausea, vomiting, or diarrhea. The patient tells me that she did see Dr. Paulla Dolly in order to get to [place] so that she can have a custom shoe made. She saw him earlier last week. She does have an appointment with Liliane Channel on the sixth I believe on January she tells me [employer] Well-nourished and well-hydrated in no acute distress. Vitals Time Taken: 1:12 PM, Height: 66 in, Weight: 245 lbs, BMI: 39.5, Temperature: 98.5 F, Pulse: 94 bpm, Respiratory  Rate: 18 breaths/min, Blood Pressure: 163/81 mmHg. General Notes: pt. didn't check her sugar Respiratory normal breathing without difficulty. Psychiatric this patient is able to make decisions and demonstrates good insight into disease process. Alert and Oriented x 3. pleasant and cooperative. General Notes: Upon inspection patient's wound actually showed signs of complete epithelization this is great news and overall I am very pleased. I do think at this time that she is getting need to continue with her wheelchair to keep from walking on this in order to allow it to continue to toughen up and stay healed. With that being said I do believe that she needs to as much as possible try to avoid walking although if she absolutely needs to walk to do certain things that is okay. I did explain that regard to physical therapy I am okay with her actually participating in some physical therapy but the best thing may be to have her therapist call me in order to discuss what exactly we want her to be doing or not doing. Integumentary (Hair, Skin) Wound #1R status is Healed - Epithelialized. Original cause of wound was Gradually Appeared. The wound is located on the [place]. The wound measures 0cm length x 0cm width x 0cm depth; 0cm^2 area and 0cm^3 volume. There is no tunneling or undermining noted. There is a none present amount of drainage noted. The wound margin is thickened. There is no granulation within the wound bed. There is no necrotic tissue within the wound bed. Assessment Active Problems ICD-10 Type 2 diabetes mellitus with foot ulcer Non-pressure chronic ulcer of other part of left foot with fat layer exposed Charcot's joint, left ankle and foot Type 2 diabetes mellitus with diabetic neuropathy, unspecified Plan Discharge From [facility] Services: Discharge from [place] Bathing/ Shower/ Hygiene: May shower and wash wound with soap and water. Off-Loading: Other: -  minimal weight bearing on left foot, no excessive walking or standing for longer than 5 minutes. Non Wound Condition: Other Non Wound Condition Orders/Instructions: - Keep left foot protected with ABD pad or gauze and kerlix. 1. Upon inspection I would recommend at this point I would discontinue wound care services as the patient appears to be completely healed this is great news. 2. Also can recommend at this time that we have the patient continue to utilize her wheelchair to stay off of her foot so it does not reopen. 3. I am also can recommend that as soon as she can get her new custom shoes she could then start getting around and walking more to be able to do what she wants to do daily. Obviously that is the goal but I do not want to rush this. We will see her back for follow-up visit as needed. Electronic Signature(s) Signed: 07/31/2020 2:20:36 PM By: Worthy Keeler PA-C Entered By: Worthy Keeler on 07/31/2020 14:20:35 -------------------------------------------------------------------------------- SuperBill Details Patient Name: Date of Service: [name] [name], [name] RA E. 07/31/2020 Medical Record Number: [id number] Patient Account Number: [id number] Date of Birth/Sex: Treating RN: 04-30-1946 (74 y.o. [name] Primary Care Provider: [name] Other Clinician: Referring Provider: Treating Provider/Extender: Adele Schilder in Treatment: 28 Diagnosis Coding ICD-10 Codes Code Description E11.621 Type 2 diabetes mellitus with foot ulcer L97.522 Non-pressure chronic ulcer of other part of left foot with fat layer exposed M14.672 Charcot's joint, left ankle and foot E11.40 Type 2 diabetes mellitus with diabetic neuropathy, unspecified Facility Procedures CPT4 Code: [id number] Description: 99213 - WOUND CARE VISIT-LEV 3 EST PT Modifier: Quantity: 1 Physician Procedures : CPT4 Code Description Modifier [id number] - WC PHYS LEVEL 3 - EST PT ICD-10  Diagnosis Description E11.621 Type 2 diabetes mellitus with foot ulcer L97.522 Non-pressure chronic ulcer of other part of left foot with fat layer exposed M14.672 Charcot's  joint, left ankle and foot E11.40 Type 2 diabetes mellitus with diabetic neuropathy, unspecified Quantity: 1 Electronic Signature(s) Signed: 07/31/2020 2:20:48 PM By: Worthy Keeler PA-C Entered By: Worthy Keeler on 07/31/2020 14:20:48

## 2020-07-31 NOTE — Progress Notes (Signed)
[name] [name], [name] [name] ([id number]) Visit Report for 07/31/2020 Arrival Information Details Patient Name: Date of Service: [name] [name], [name] E. 07/31/2020 12:45 PM Medical Record Number: [id number] Patient Account Number: [id number] Date of Birth/Sex: Treating RN: 20-Jun-1946 (74 y.o. [name] [name], [name] [name] Primary Care Michele Kerlin: [name] Other Clinician: Referring Sevilla Murtagh: Treating Yarethzi Branan/Extender: Adele Schilder in Treatment: 28 Visit Information History Since Last Visit Added or deleted any medications: No Patient Arrived: Wheel Chair Any new allergies or adverse reactions: No Arrival Time: 13:11 Had a fall or experienced change in No Accompanied By: self activities of daily living that may affect Transfer Assistance: None risk of falls: Patient Identification Verified: Yes Signs or symptoms of abuse/neglect since last visito No Secondary Verification Process Completed: Yes Hospitalized since last visit: No Patient Requires Transmission-Based Precautions: No Implantable device outside of the clinic excluding No Patient Has Alerts: No cellular tissue based products placed in the center since last visit: Has Dressing in Place as Prescribed: Yes Pain Present Now: No Electronic Signature(s) Signed: 07/31/2020 5:16:00 PM By: Rhae Hammock RN Entered By: Rhae Hammock on 07/31/2020 13:12:46 -------------------------------------------------------------------------------- Clinic Level of Care Assessment Details Patient Name: Date of Service: [name], [name] 07/31/2020 12:45 PM Medical Record Number: [id number] Patient Account Number: [id number] Date of Birth/Sex: Treating RN: 1946/01/19 (74 y.o. [name] [name] Primary Care Chrisean Kloth: [name] Other Clinician: Referring Cabrini Ruggieri: Treating Yarlin Breisch/Extender: Adele Schilder in Treatment: 28 Clinic Level of Care Assessment Items TOOL 4 Quantity Score X- 1 0 Use when only an  EandM is performed on FOLLOW-UP visit ASSESSMENTS - Nursing Assessment / Reassessment X- 1 10 Reassessment of Co-morbidities (includes updates in patient status) X- 1 5 Reassessment of Adherence to Treatment Plan ASSESSMENTS - Wound and Skin A ssessment / Reassessment X - Simple Wound Assessment / Reassessment - one wound 1 5 []  - 0 Complex Wound Assessment / Reassessment - multiple wounds []  - 0 Dermatologic / Skin Assessment (not related to wound area) ASSESSMENTS - Focused Assessment []  - 0 Circumferential Edema Measurements - multi extremities []  - 0 Nutritional Assessment / Counseling / Intervention X- 1 5 Lower Extremity Assessment (monofilament, tuning fork, pulses) []  - 0 Peripheral Arterial Disease Assessment (using hand held doppler) ASSESSMENTS - Ostomy and/or Continence Assessment and Care []  - 0 Incontinence Assessment and Management []  - 0 Ostomy Care Assessment and Management (repouching, etc.) PROCESS - Coordination of Care X - Simple Patient / Family Education for ongoing care 1 15 []  - 0 Complex (extensive) Patient / Family Education for ongoing care X- 1 10 Staff obtains Consents, Records, T Results / Process Orders est []  - 0 Staff telephones HHA, Nursing Homes / Clarify orders / etc []  - 0 Routine Transfer to another Facility (non-emergent condition) []  - 0 Routine Hospital Admission (non-emergent condition) []  - 0 New Admissions / Biomedical engineer / Ordering NPWT Apligraf, etc. , []  - 0 Emergency Hospital Admission (emergent condition) X- 1 10 Simple Discharge Coordination []  - 0 Complex (extensive) Discharge Coordination PROCESS - Special Needs []  - 0 Pediatric / Minor Patient Management []  - 0 Isolation Patient Management []  - 0 Hearing / Language / Visual special needs []  - 0 Assessment of Community assistance (transportation, D/C planning, etc.) []  - 0 Additional assistance / Altered mentation []  - 0 Support Surface(s)  Assessment (bed, cushion, seat, etc.) INTERVENTIONS - Wound Cleansing / Measurement X - Simple Wound Cleansing - one wound 1 5 []  - 0 Complex Wound  Cleansing - multiple wounds X- 1 5 Wound Imaging (photographs - any number of wounds) []  - 0 Wound Tracing (instead of photographs) X- 1 5 Simple Wound Measurement - one wound []  - 0 Complex Wound Measurement - multiple wounds INTERVENTIONS - Wound Dressings []  - 0 Small Wound Dressing one or multiple wounds []  - 0 Medium Wound Dressing one or multiple wounds []  - 0 Large Wound Dressing one or multiple wounds []  - 0 Application of Medications - topical []  - 0 Application of Medications - injection INTERVENTIONS - Miscellaneous []  - 0 External ear exam []  - 0 Specimen Collection (cultures, biopsies, blood, body fluids, etc.) []  - 0 Specimen(s) / Culture(s) sent or taken to Lab for analysis []  - 0 Patient Transfer (multiple staff / Civil Service fast streamer / Similar devices) []  - 0 Simple Staple / Suture removal (25 or less) []  - 0 Complex Staple / Suture removal (26 or more) []  - 0 Hypo / Hyperglycemic Management (close monitor of Blood Glucose) []  - 0 Ankle / Brachial Index (ABI) - do not check if billed separately X- 1 5 Vital Signs Has the patient been seen at the hospital within the last three years: Yes Total Score: 80 Level Of Care: New/Established - Level 3 Electronic Signature(s) Signed: 07/31/2020 7:00:57 PM By: Levan Hurst RN, BSN Entered By: Levan Hurst on 07/31/2020 14:16:53 -------------------------------------------------------------------------------- Encounter Discharge Information Details Patient Name: Date of Service: [name] [name] 07/31/2020 12:45 PM Medical Record Number: [id number] Patient Account Number: [id number] Date of Birth/Sex: Treating RN: Dec 14, 1945 (74 y.o. [name] [name] Primary Care Camielle Sizer: [name] Other Clinician: Referring Amaury Kuzel: Treating Ailed Defibaugh/Extender: Adele Schilder in Treatment: 28 Encounter Discharge Information Items Discharge Condition: Stable Ambulatory Status: Wheelchair Discharge Destination: Home Transportation: Private Auto Accompanied By: self Schedule Follow-up Appointment: Yes Clinical Summary of Care: Patient Declined Electronic Signature(s) Signed: 07/31/2020 5:14:06 PM By: Carlene Coria RN Entered By: Carlene Coria on 07/31/2020 14:29:44 -------------------------------------------------------------------------------- Lower Extremity Assessment Details Patient Name: Date of Service: [name] [name], [name] E. 07/31/2020 12:45 PM Medical Record Number: [id number] Patient Account Number: [id number] Date of Birth/Sex: Treating RN: Oct 22, 1945 (74 y.o. [name] [name], [name] [name] Primary Care Hagen Bohorquez: [name] Other Clinician: Referring Chalsey Leeth: Treating Hillary Schwegler/Extender: Landis Martins Weeks in Treatment: 28 Edema Assessment Assessed: [Left: No] [Right: No] Edema: [Left: N] [Right: o] Calf Left: Right: Point of Measurement: 35 cm From Medial Instep 34 cm Ankle Left: Right: Point of Measurement: 9 cm From Medial Instep 24.5 cm Knee To Floor Left: Right: From Medial Instep 39 cm Vascular Assessment Pulses: Dorsalis Pedis Palpable: [Left:Yes] Posterior Tibial Palpable: [Left:Yes] Electronic Signature(s) Signed: 07/31/2020 5:16:00 PM By: Rhae Hammock RN Entered By: Rhae Hammock on 07/31/2020 13:28:22 -------------------------------------------------------------------------------- Multi-Disciplinary Care Plan Details Patient Name: Date of Service: [name] [name] 07/31/2020 12:45 PM Medical Record Number: [id number] Patient Account Number: [id number] Date of Birth/Sex: Treating RN: Mar 13, 1946 (74 y.o. [name] [name] Primary Care Kyrin Garn: [name] Other Clinician: Referring Shanee Batch: Treating Nanna Ertle/Extender: Landis Martins Weeks in Treatment: 28 Active  Inactive Electronic Signature(s) Signed: 07/31/2020 7:00:57 PM By: Levan Hurst RN, BSN Entered By: Levan Hurst on 07/31/2020 14:16:07 -------------------------------------------------------------------------------- Pain Assessment Details Patient Name: Date of Service: [name] [name] 07/31/2020 12:45 PM Medical Record Number: [id number] Patient Account Number: [id number] Date of Birth/Sex: Treating RN: Mar 28, 1946 (74 y.o. [name] [name], [name] [name] Primary Care Artie Mcintyre: [name] Other Clinician: Referring Beautiful Pensyl: Treating Trayshawn Durkin/Extender: Earlean Shawl, Marzetta Board  Weeks in Treatment: 28 Active Problems Location of Pain Severity and Description of Pain Patient Has Paino No Site Locations Rate the pain. Rate the pain. Current Pain Level: 0 Pain Management and Medication Current Pain Management: Electronic Signature(s) Signed: 07/31/2020 5:16:00 PM By: Rhae Hammock RN Entered By: Rhae Hammock on 07/31/2020 13:15:26 -------------------------------------------------------------------------------- Patient/Caregiver Education Details Patient Name: Date of Service: [name] [name] 12/22/2021andnbsp12:45 PM Medical Record Number: [id number] Patient Account Number: [id number] Date of Birth/Gender: Treating RN: 07/16/1946 (74 y.o. [name] [name] Primary Care Physician: [name] Other Clinician: Referring Physician: Treating Physician/Extender: Adele Schilder in Treatment: 28 Education Assessment Education Provided To: Patient Education Topics Provided Wound/Skin Impairment: Methods: Explain/Verbal Responses: State content correctly [employer]) Signed: 07/31/2020 7:00:57 PM By: Levan Hurst RN, BSN Entered By: Levan Hurst on 07/31/2020 14:16:20 -------------------------------------------------------------------------------- Wound Assessment Details Patient Name: Date of Service: [name] [name]  07/31/2020 12:45 PM Medical Record Number: [id number] Patient Account Number: [id number] Date of Birth/Sex: Treating RN: Dec 18, 1945 (74 y.o. [name] [name], [name] [name] Primary Care Saori Umholtz: [name] Other Clinician: Referring Angelissa Supan: Treating Haider Hornaday/Extender: Landis Martins Weeks in Treatment: 28 Wound Status Wound Number: [id number] Primary Diabetic Wound/Ulcer of the Lower Extremity Etiology: Wound Location: Left, Medial, Plantar Foot Wound Healed - Epithelialized Wounding Event: Gradually Appeared Status: Date Acquired: 05/11/2019 Comorbid Sleep Apnea, Deep Vein Thrombosis, Hypertension, Colitis, Type Weeks Of Treatment: 28 History: II Diabetes, Gout, Osteoarthritis, Neuropathy Clustered Wound: No Wound Measurements Length: (cm) Width: (cm) Depth: (cm) Area: (cm) Volume: (cm) 0 % Reduction in Area: 100% 0 % Reduction in Volume: 100% 0 Epithelialization: Large (67-100%) 0 Tunneling: No 0 Undermining: No Wound Description Classification: Grade 2 Wound Margin: Thickened Exudate Amount: None Present Foul Odor After Cleansing: No Slough/Fibrino No Wound Bed Granulation Amount: None Present (0%) Exposed Structure Necrotic Amount: None Present (0%) Fascia Exposed: No Fat Layer (Subcutaneous Tissue) Exposed: No Tendon Exposed: No Muscle Exposed: No Joint Exposed: No Bone Exposed: No Electronic Signature(s) Signed: 07/31/2020 5:16:00 PM By: Rhae Hammock RN Signed: 07/31/2020 7:00:57 PM By: Levan Hurst RN, BSN Entered By: Levan Hurst on 07/31/2020 13:50:52 -------------------------------------------------------------------------------- Independence Details Patient Name: Date of Service: [name] [name], [name] RA E. 07/31/2020 12:45 PM Medical Record Number: [id number] Patient Account Number: [id number] Date of Birth/Sex: Treating RN: 05-Nov-1945 (74 y.o. [name] [name], [name] [name] Primary Care Lise Pincus: [name] Other Clinician: Referring Corney Knighton: Treating  Johnta Couts/Extender: Adele Schilder in Treatment: 28 Vital Signs Time Taken: 13:12 Temperature (F): 98.5 Height (in): 66 Pulse (bpm): 94 Weight (lbs): 245 Respiratory Rate (breaths/min): 18 Body Mass Index (BMI): 39.5 Blood Pressure (mmHg): 163/81 Reference Range: 80 - 120 mg / dl Notes pt. didn't check her sugar Electronic Signature(s) Signed: 07/31/2020 5:16:00 PM By: Rhae Hammock RN Entered By: Rhae Hammock on 07/31/2020 13:14:43

## 2020-08-01 DIAGNOSIS — N179 Acute kidney failure, unspecified: Secondary | ICD-10-CM | POA: Diagnosis not present

## 2020-08-01 DIAGNOSIS — E1165 Type 2 diabetes mellitus with hyperglycemia: Secondary | ICD-10-CM | POA: Diagnosis not present

## 2020-08-01 DIAGNOSIS — E11621 Type 2 diabetes mellitus with foot ulcer: Secondary | ICD-10-CM | POA: Diagnosis not present

## 2020-08-01 DIAGNOSIS — E1142 Type 2 diabetes mellitus with diabetic polyneuropathy: Secondary | ICD-10-CM | POA: Diagnosis not present

## 2020-08-01 DIAGNOSIS — I959 Hypotension, unspecified: Secondary | ICD-10-CM | POA: Diagnosis not present

## 2020-08-01 DIAGNOSIS — L97529 Non-pressure chronic ulcer of other part of left foot with unspecified severity: Secondary | ICD-10-CM | POA: Diagnosis not present

## 2020-08-04 ENCOUNTER — Other Ambulatory Visit: Payer: Self-pay | Admitting: Internal Medicine

## 2020-08-06 DIAGNOSIS — L97529 Non-pressure chronic ulcer of other part of left foot with unspecified severity: Secondary | ICD-10-CM | POA: Diagnosis not present

## 2020-08-06 DIAGNOSIS — E1142 Type 2 diabetes mellitus with diabetic polyneuropathy: Secondary | ICD-10-CM | POA: Diagnosis not present

## 2020-08-06 DIAGNOSIS — I959 Hypotension, unspecified: Secondary | ICD-10-CM | POA: Diagnosis not present

## 2020-08-06 DIAGNOSIS — N179 Acute kidney failure, unspecified: Secondary | ICD-10-CM | POA: Diagnosis not present

## 2020-08-06 DIAGNOSIS — E1165 Type 2 diabetes mellitus with hyperglycemia: Secondary | ICD-10-CM | POA: Diagnosis not present

## 2020-08-06 DIAGNOSIS — E11621 Type 2 diabetes mellitus with foot ulcer: Secondary | ICD-10-CM | POA: Diagnosis not present

## 2020-08-12 DIAGNOSIS — D649 Anemia, unspecified: Secondary | ICD-10-CM

## 2020-08-12 DIAGNOSIS — Z8616 Personal history of COVID-19: Secondary | ICD-10-CM

## 2020-08-12 DIAGNOSIS — Z7952 Long term (current) use of systemic steroids: Secondary | ICD-10-CM

## 2020-08-12 DIAGNOSIS — E872 Acidosis: Secondary | ICD-10-CM

## 2020-08-12 DIAGNOSIS — K219 Gastro-esophageal reflux disease without esophagitis: Secondary | ICD-10-CM

## 2020-08-12 DIAGNOSIS — Z86718 Personal history of other venous thrombosis and embolism: Secondary | ICD-10-CM

## 2020-08-12 DIAGNOSIS — E1142 Type 2 diabetes mellitus with diabetic polyneuropathy: Secondary | ICD-10-CM | POA: Diagnosis not present

## 2020-08-12 DIAGNOSIS — N179 Acute kidney failure, unspecified: Secondary | ICD-10-CM | POA: Diagnosis not present

## 2020-08-12 DIAGNOSIS — Z7984 Long term (current) use of oral hypoglycemic drugs: Secondary | ICD-10-CM

## 2020-08-12 DIAGNOSIS — K5730 Diverticulosis of large intestine without perforation or abscess without bleeding: Secondary | ICD-10-CM | POA: Diagnosis not present

## 2020-08-12 DIAGNOSIS — I700 Atherosclerosis of aorta: Secondary | ICD-10-CM | POA: Diagnosis not present

## 2020-08-12 DIAGNOSIS — Z9181 History of falling: Secondary | ICD-10-CM

## 2020-08-12 DIAGNOSIS — L411 Pityriasis lichenoides chronica: Secondary | ICD-10-CM

## 2020-08-12 DIAGNOSIS — M109 Gout, unspecified: Secondary | ICD-10-CM

## 2020-08-12 DIAGNOSIS — G4733 Obstructive sleep apnea (adult) (pediatric): Secondary | ICD-10-CM | POA: Diagnosis not present

## 2020-08-12 DIAGNOSIS — M069 Rheumatoid arthritis, unspecified: Secondary | ICD-10-CM

## 2020-08-12 DIAGNOSIS — E1165 Type 2 diabetes mellitus with hyperglycemia: Secondary | ICD-10-CM | POA: Diagnosis not present

## 2020-08-12 DIAGNOSIS — L4050 Arthropathic psoriasis, unspecified: Secondary | ICD-10-CM

## 2020-08-12 DIAGNOSIS — K52832 Lymphocytic colitis: Secondary | ICD-10-CM | POA: Diagnosis not present

## 2020-08-12 DIAGNOSIS — E11621 Type 2 diabetes mellitus with foot ulcer: Secondary | ICD-10-CM | POA: Diagnosis not present

## 2020-08-12 DIAGNOSIS — I959 Hypotension, unspecified: Secondary | ICD-10-CM | POA: Diagnosis not present

## 2020-08-12 DIAGNOSIS — M170 Bilateral primary osteoarthritis of knee: Secondary | ICD-10-CM

## 2020-08-12 DIAGNOSIS — I119 Hypertensive heart disease without heart failure: Secondary | ICD-10-CM | POA: Diagnosis not present

## 2020-08-12 DIAGNOSIS — Z96641 Presence of right artificial hip joint: Secondary | ICD-10-CM

## 2020-08-12 DIAGNOSIS — E785 Hyperlipidemia, unspecified: Secondary | ICD-10-CM

## 2020-08-12 DIAGNOSIS — L97529 Non-pressure chronic ulcer of other part of left foot with unspecified severity: Secondary | ICD-10-CM | POA: Diagnosis not present

## 2020-08-12 DIAGNOSIS — Z7982 Long term (current) use of aspirin: Secondary | ICD-10-CM

## 2020-08-12 DIAGNOSIS — I4510 Unspecified right bundle-branch block: Secondary | ICD-10-CM | POA: Diagnosis not present

## 2020-08-12 DIAGNOSIS — Z794 Long term (current) use of insulin: Secondary | ICD-10-CM

## 2020-08-12 DIAGNOSIS — Z79899 Other long term (current) drug therapy: Secondary | ICD-10-CM

## 2020-08-13 ENCOUNTER — Ambulatory Visit: Payer: Medicare Other | Admitting: Orthotics

## 2020-08-13 ENCOUNTER — Other Ambulatory Visit: Payer: Self-pay

## 2020-08-13 DIAGNOSIS — L97422 Non-pressure chronic ulcer of left heel and midfoot with fat layer exposed: Secondary | ICD-10-CM

## 2020-08-13 DIAGNOSIS — L84 Corns and callosities: Secondary | ICD-10-CM

## 2020-08-13 DIAGNOSIS — I959 Hypotension, unspecified: Secondary | ICD-10-CM | POA: Diagnosis not present

## 2020-08-13 DIAGNOSIS — L03119 Cellulitis of unspecified part of limb: Secondary | ICD-10-CM

## 2020-08-13 DIAGNOSIS — M898X7 Other specified disorders of bone, ankle and foot: Secondary | ICD-10-CM

## 2020-08-13 DIAGNOSIS — E11621 Type 2 diabetes mellitus with foot ulcer: Secondary | ICD-10-CM | POA: Diagnosis not present

## 2020-08-13 DIAGNOSIS — L02619 Cutaneous abscess of unspecified foot: Secondary | ICD-10-CM

## 2020-08-13 DIAGNOSIS — I739 Peripheral vascular disease, unspecified: Secondary | ICD-10-CM

## 2020-08-13 DIAGNOSIS — E1142 Type 2 diabetes mellitus with diabetic polyneuropathy: Secondary | ICD-10-CM | POA: Diagnosis not present

## 2020-08-13 DIAGNOSIS — E1165 Type 2 diabetes mellitus with hyperglycemia: Secondary | ICD-10-CM | POA: Diagnosis not present

## 2020-08-13 DIAGNOSIS — L97529 Non-pressure chronic ulcer of other part of left foot with unspecified severity: Secondary | ICD-10-CM | POA: Diagnosis not present

## 2020-08-13 DIAGNOSIS — N179 Acute kidney failure, unspecified: Secondary | ICD-10-CM | POA: Diagnosis not present

## 2020-08-13 DIAGNOSIS — L97522 Non-pressure chronic ulcer of other part of left foot with fat layer exposed: Secondary | ICD-10-CM

## 2020-08-13 NOTE — Progress Notes (Signed)
Patient was measured for med necessity CUSTOM shoes (x2) w/ 2pair (x6) custom f/o. Patient was measured w/ brannock to determine size and width.  Foam impression mold was achieved and deemed appropriate for fabrication of  cmfo.   See DPM chart notes for further documentation and dx codes for determination of medical necessity.  Appropriate forms will be sent to PCP to verify and sign off on medical necessity.

## 2020-08-14 ENCOUNTER — Encounter: Payer: Self-pay | Admitting: Internal Medicine

## 2020-08-14 MED ORDER — CARVEDILOL 6.25 MG PO TABS: 6.2500 mg | ORAL_TABLET | Freq: Two times a day (BID) | ORAL | 3 refills | Status: DC

## 2020-08-16 DIAGNOSIS — E1142 Type 2 diabetes mellitus with diabetic polyneuropathy: Secondary | ICD-10-CM | POA: Diagnosis not present

## 2020-08-16 DIAGNOSIS — E1165 Type 2 diabetes mellitus with hyperglycemia: Secondary | ICD-10-CM | POA: Diagnosis not present

## 2020-08-16 DIAGNOSIS — E11621 Type 2 diabetes mellitus with foot ulcer: Secondary | ICD-10-CM | POA: Diagnosis not present

## 2020-08-16 DIAGNOSIS — N179 Acute kidney failure, unspecified: Secondary | ICD-10-CM | POA: Diagnosis not present

## 2020-08-16 DIAGNOSIS — L97529 Non-pressure chronic ulcer of other part of left foot with unspecified severity: Secondary | ICD-10-CM | POA: Diagnosis not present

## 2020-08-16 DIAGNOSIS — I959 Hypotension, unspecified: Secondary | ICD-10-CM | POA: Diagnosis not present

## 2020-08-19 DIAGNOSIS — K5289 Other specified noninfective gastroenteritis and colitis: Secondary | ICD-10-CM | POA: Diagnosis not present

## 2020-08-19 DIAGNOSIS — Z86010 Personal history of colonic polyps: Secondary | ICD-10-CM | POA: Diagnosis not present

## 2020-08-22 DIAGNOSIS — L4059 Other psoriatic arthropathy: Secondary | ICD-10-CM | POA: Diagnosis not present

## 2020-08-28 ENCOUNTER — Other Ambulatory Visit: Payer: Self-pay

## 2020-08-28 ENCOUNTER — Other Ambulatory Visit: Payer: Self-pay | Admitting: Gastroenterology

## 2020-08-28 ENCOUNTER — Encounter (HOSPITAL_BASED_OUTPATIENT_CLINIC_OR_DEPARTMENT_OTHER): Payer: Medicare Other | Attending: Physician Assistant | Admitting: Physician Assistant

## 2020-08-28 DIAGNOSIS — E11621 Type 2 diabetes mellitus with foot ulcer: Secondary | ICD-10-CM | POA: Insufficient documentation

## 2020-08-28 DIAGNOSIS — E1140 Type 2 diabetes mellitus with diabetic neuropathy, unspecified: Secondary | ICD-10-CM | POA: Diagnosis not present

## 2020-08-28 DIAGNOSIS — E11610 Type 2 diabetes mellitus with diabetic neuropathic arthropathy: Secondary | ICD-10-CM | POA: Insufficient documentation

## 2020-08-28 DIAGNOSIS — L97522 Non-pressure chronic ulcer of other part of left foot with fat layer exposed: Secondary | ICD-10-CM | POA: Diagnosis not present

## 2020-08-28 DIAGNOSIS — R1319 Other dysphagia: Secondary | ICD-10-CM

## 2020-08-28 DIAGNOSIS — L84 Corns and callosities: Secondary | ICD-10-CM | POA: Diagnosis not present

## 2020-08-28 NOTE — Progress Notes (Addendum)
LYNNETT, LANGLINAIS ([id number]) Visit Report for 08/28/2020 Chief Complaint Document Details Patient Name: Date of Service: [name] [name], FLAMM RA E. 08/28/2020 2:00 PM Medical Record Number: [id number] Patient Account Number: [id number] Date of Birth/Sex: Treating RN: 23-Mar-1946 (75 y.o. [name] Other Clinician: Referring Provider: Treating Provider/Extender: Adele Schilder in Treatment: 32 Information Obtained from: Patient Chief Complaint Left foot ulcer Electronic Signature(s) Signed: 08/28/2020 2:23:32 PM By: Worthy Keeler PA-C Entered By: Worthy Keeler on 08/28/2020 14:23:32 -------------------------------------------------------------------------------- Debridement Details Patient Name: Date of Service: [name] Amato RA E. 08/28/2020 2:00 PM Medical Record Number: [id number] Patient Account Number: [id number] Date of Birth/Sex: Treating RN: June 24, 1946 (75 y.o. [name] Other Clinician: Referring Provider: Treating Provider/Extender: Adele Schilder in Treatment: 32 Debridement Performed for Assessment: Wound #1RR Left,Medial,Plantar Foot Performed By: Physician Worthy Keeler, PA Debridement Type: Debridement Severity of Tissue Pre Debridement: Fat layer exposed Level of Consciousness (Pre-procedure): Awake and Alert Pre-procedure Verification/Time Out Yes - 15:00 Taken: Start Time: 15:02 Pain Control: Lidocaine 5% topical ointment T Area Debrided (L x W): otal 1 (cm) x 1 (cm) = 1 (cm) Tissue and other material debrided: Viable, Non-Viable, Callus, Slough, Subcutaneous, Skin: Epidermis, Slough Level: Skin/Subcutaneous Tissue Debridement Description: Excisional Instrument: Curette Bleeding: Minimum Hemostasis Achieved: Pressure End Time: 15:07 Procedural Pain: 0 Post Procedural Pain: 0 Response to Treatment: Procedure was tolerated well Level  of Consciousness (Post- Awake and Alert procedure): Post Debridement Measurements of Total Wound Length: (cm) 0.3 Width: (cm) 0.4 Depth: (cm) 0.1 Volume: (cm) 0.009 Character of Wound/Ulcer Post Debridement: Improved Severity of Tissue Post Debridement: Fat layer exposed Post Procedure Diagnosis Same as Pre-procedure Electronic Signature(s) Signed: 08/28/2020 5:51:30 PM By: [name] RN, BSN Signed: 08/28/2020 6:05:28 PM By: Worthy Keeler PA-C Entered By: [name] on 08/28/2020 15:06:16 -------------------------------------------------------------------------------- HPI Details Patient Name: Date of Service: [name], [name] RA E. 08/28/2020 2:00 PM Medical Record Number: [id number] Patient Account Number: [id number] Date of Birth/Sex: Treating RN: 1946/04/02 (75 y.o. [name] Other Clinician: Referring Provider: Treating Provider/Extender: Adele Schilder in Treatment: 32 History of Present Illness HPI Description: 01/17/2020 upon evaluation today patient presents for initial evaluation here in our clinic concerning issues she has been having with a left medial/plantar foot ulcer. This is actually been an issue for her since October 2020. She has been seeing Dr. Doran Durand for quite some time during that course. Fortunately there is no signs of active infection at this time. Or least no mention of this to have seen in general. With that being said unfortunately I do see some signs of erythema noted today that does have me concerned about the possibility of infection at this point in the surrounding area of the wound. There is also a warm to touch at the site which is somewhat concerning. Fortunately there is no evidence of systemic infection which is great news. The patient does have a history of diabetes mellitus type 2, Charcot foot which is what led to the wound, and hypertension. She notes that she was in a cast  for some time with Dr. Doran Durand for about 8 weeks. During that time they were utilizing according to the patient silver nitrate along with a foam doughnut and then Coban to secure in place in the cast in place. With that being said I do not have the actual  records to review we are going to try to get a hold of those unfortunately they would not flow over into care everywhere I did look today. She has been seeing Dr. Doran Durand and his physician assistant Larkin Ina up until the end of May and apparently is still seeing them on a regular basis every 2 weeks roughly. She has also tried Iodosorb without effect here. 01/24/2020 upon evaluation today patient actually appears to be doing quite well with regard to her wounds. She has been tolerating the dressing changes without complication. Fortunately there is no signs of active infection spreading which is good news. Her culture did show signs of Staph aureus I did place her on Augmentin due to the erythema surrounding the wound. With that being said the wound does appear to be doing better she has her longer walking cast/boot and I think that is actually good for her for the time being. I am considering reinitiating total contact cast when she gets back from vacation but next week she will actually be out of town at [employer] she knows not to get in the water but she still obviously is planning to enjoy herself she is going to take it easy on her foot however. 02/07/2020 upon evaluation today patient appears to be doing fairly well in regard to her ulcer on her foot. Fortunately there is no signs of severe infection at this time which is great news and overall very pleased in that regard. With that being said I do think that she could still benefit from a total contact cast. Nonetheless she is using her walking boot which at least provide some protection and that it prevents some of the friction occurring when she is ambulating. 02/14/2020 upon evaluation today patient  appears to be doing well with regard to her foot ulcer. This is actually measuring a little bit smaller yet again this week. Overall very pleased with where things stand and I do not see any signs of active infection at this time which is also good news. Since she is measuring better the patient has wanting to somewhat hold off on proceeding with the total contact cast which I think is reasonable at this point. 02/28/2020 on evaluation today patient appears to be doing well in general in regard to her wound although she has a lot of callus buildup as compared to last time I saw her. This is can require sharp debridement today. I do believe she really needs the total contact cast as well which we have discussed previous. 7/23; patient comes in for a total contact cast change 03/06/2020 on evaluation today patient appears to be doing quite well with regard to her wounds. Fortunately the wound bed is measuring smaller and looking much better there is little callus noted although there is some debridement necessary today. 03/13/2020 on evaluation today patient's wound actually appears to be doing excellent which is great news. With that being said unfortunately she is having some issues currently with her left leg where she does have cellulitis it appears. This may have come from an area that rubbed underneath the cast from last week that we noted we padded that area and it looks to be doing excellent at this point but nonetheless the leg was somewhat painful, swollen, and somewhat erythematous. She also had an elevated white blood cell count of 11.5 based on what I saw on looking at her records from the med center in [facility] from where she was seen yesterday. Unfortunately with  [place] having a provider on vacation there was no one here in the clinic in the afternoon when she called therefore she went to the ER as advised. Subsequently they did not cut off the cast as they did not have anyone from orthopedics  there to do so and subsequently also did not have the ability to do the Doppler for evaluation of DVT They recommended therefore given her dose of Eliquis as well as . Augmentin and sent her home to come see [place] today to have the cast taken off and then she is supposed to go back to have the study for DVT performed they are following. 03/20/2020 upon evaluation today patient appears to be doing well with regard to her foot all things considered we have not been able to use the total contact cast due to the infection that she had last week. She has been on the doxycycline and she had a 10-day supply of that I do believe that is helping and her leg appears to be doing better. With that being said there is fortunately no signs of active infection systemically at this time which is good news. No fevers, chills, nausea, vomiting, or diarrhea. 03/27/2020 upon evaluation today patient appears to be doing well with regard to her foot ulcer. There does not appear to be signs of active infection which is great news. Overall I am very pleased with where things stand at this point. 04/03/2020 upon evaluation today patient appears to be doing pretty well in regard to the overall appearance of her wound. Fortunately there is no signs of active infection at this time which is great news. No fevers, chills, nausea, vomiting, or diarrhea. With that being said she does have some blue-green drainage that actually is a little bit concerning to me for the possibility of Pseudomonas. I discussed that with the patient today. With that being said I do believe that we may be able to manage this however with the topical antibiotic cream as opposed to having to do anything oral especially since she seems to be doing so well with overall appearance of the wound. 04/10/2020 on evaluation today patient appears to be doing about the same roughly in regard to the overall size of her wound. With that being said she fortunately has not  shown any signs of worsening overall which is good news. I do believe that she is doing a great job trying to offload but again she may still do better with the cast. I do not see in the blue-green drainage that we noticed previously I do believe the gentamicin help in this regard. 04/17/2020 on evaluation today patient's wound appears to be doing about the same at this point. There is no significant improvement at this point. No fever chills noted. She is up for put the cast back on the day. That she states in a couple weeks she will need to have this off to go to a workshop. 04/24/2020 on evaluation today patient appears to be doing significantly better in regard to her wound. Fortunately there is no signs of active infection and overall feel like she is making great progress the cast seems to have done excellent for her. 05/01/2020 upon evaluation today patient presents for reevaluation she really does not appear to be doing too badly in regard to the actual wound on the left foot we have been managing. Unfortunately she has bilateral lower extremity edema with blisters between the webspace of her first and second toe on both  feet. She has a tremendous amount of edema in the legs which I think is where this is coming from it does not appear to be infected but nonetheless I do believe this is can be something that needs to be addressed today. Obviously this means we probably will not be putting the cast on at this point. She attributes this to the fact that she was sitting with her feet on the floor much longer during a conference last week she had a great time but unfortunately had a lot of complications as a result. 05/08/2020 upon evaluation today patient appears to be doing somewhat better in regard to her wounds at this time. Fortunately there is no signs of active infection which is great news. With that being said I do believe that the blisters have ruptured and unfortunately did not just reattach I  will remove some of the blistered tissue today. With that being said I do think the wound itself on the plantar aspect of left foot does need to have sharp debridement. 05/15/2020 upon evaluation today patient appears to be doing about the same in regard to her foot ulcer. Unfortunately in the past week her husband had a fall where he sustained a mild traumatic brain bleed. Fortunately he is doing better but being that he was in the hospital she had a walk on this a lot more. The wound does not appear to be any better is also not really appearing to be significantly worse which is good news. There is no signs of active infection at this time. 10/14; patient with a small diabetic wound on the medial part of her left foot. We have been using silver collagen a total contact cast making good progress. I think the patient had a series of blisters on her dorsal foot probably secondary to having her legs recumbent for 3 days while in a conference in [place]. We wrapped her leg last week these are all healed. We did not previously have her in compression on the right leg. 05/29/2020 upon evaluation today patient appears to be doing well with regard to the wound on the plantar aspect of her foot medially. This is measuring smaller and looking much better than last time I saw her. Again when I did see her last was 2 weeks back and the wound was significantly larger. I do believe the cast is helping and I believe the collagen is a good option for her. 06/05/2020 on evaluation today patient appears to be doing well with regard to her foot ulcer this is actually measuring significantly better and overall I feel like she is doing excellent. There is no signs of active infection at this time. 06/12/2020 upon evaluation today patient actually continues to show signs of good improvement which is excellent news. I am extremely pleased with how she seems to be progressing at this point in regard to her wound. There is  still some depth to the wound but I do believe the collagen is helping her quite a bit. 06/19/2020 upon evaluation today patient appears to be doing well with regard to her plantar foot ulcer. She is actually making excellent progress and in fact this appears to be almost completely healed. With that being said I do believe that the patient is going to actually require 1 more week in the cast although after that I am hopeful she will be ready for discharge. 06/26/2020 on evaluation today patient appears to be doing well in regard to her wound currently. Fortunately  there is no signs of active infection in general I feel like she is doing excellent. This appears to be completely healed I think she is ready to come out of the cast. 11/29; patient comes back in the clinic today with a very quick reopening in the exact same area on the medial plantar foot. She had been healed out last time. She went back into some new balance shoes that she got at [employer] with a custom insert. As it turns out this wound also happened when wearing these shoes although there was some modification made I think with the wound initially happened they added some foam around the wound area. This obviously is not going to be sufficient. 07/17/2020 on evaluation today patient appears to be doing well with regard to her wound. Fortunately she seems to be making good progress. Unfortunately she was in the hospital due to a issue with colitis and had just been discharged today in fact. She tells me that her biggest concern here is that a lot of her numbers especially her creatinine were somewhat elevated and problematic. She is can be following up with her provider in order to have a further work-up at this time. With that being said she did need to come back in for a cast change. She did not allow them to remove the cast due to the fact that she did not feel like that was the issue whatsoever and indeed it does not appear that was the  case her wound appears to be doing excellent today. 07/24/2020 upon evaluation today patient appears to be doing well in regard to her foot ulcer. She still has a small opening but this is showing signs of excellent improvement overall but they were very close to complete resolution. No fevers, chills, nausea, vomiting, or diarrhea. 07/31/2020 upon evaluation today patient actually appears to be doing excellent she is actually completely healed this is great news. Fortunately there is no signs of active infection at this time. No fevers, chills, nausea, vomiting, or diarrhea. The patient tells me that she did see Dr. Paulla Dolly in order to get to [facility] so that she can have a custom shoe made. She saw him earlier last week. She does have an appointment with Liliane Channel on the sixth I believe on January she tells me Readmission: 08/28/2020 on evaluation today patient appears to be doing poorly in regard to her wound currently. She tells me this has reopened. The good news that she did get measured for and actually her shoes are on the way in from [facility].. With that being said she has been trying to stay off of this is much as possible using her wheelchair around home. Nonetheless has been somewhat difficult. The good news is her shoes should be here next week Electronic Signature(s) Signed: 08/28/2020 3:13:23 PM By: Worthy Keeler PA-C Entered By: Worthy Keeler on 08/28/2020 15:13:22 -------------------------------------------------------------------------------- Physical Exam Details Patient Name: Date of Service: Meester, [place] RBA RA E. 08/28/2020 2:00 PM Medical Record Number: [id number] Patient Account Number: [id number] Date of Birth/Sex: Treating RN: 11-Sep-1945 (75 y.o. [name] Other Clinician: Referring Provider: Treating Provider/Extender: Landis Martins Weeks in Treatment: 20 Constitutional Well-nourished and well-hydrated in no  acute distress. Respiratory normal breathing without difficulty. Psychiatric this patient is able to make decisions and demonstrates good insight into disease process. Alert and Oriented x 3. pleasant and cooperative. Notes Patient's wound bed currently did require some sharp  debridement. It does not appear to be nearly as deep as what it was in the past and there was some callus around the edges of the wound that I did have to perform debridement. There was also a little bit of slough on the surface of the wound. I debrided everything down to good subcutaneous tissue and the patient did have the callus removed as well post debridement everything appears to be doing excellent. Electronic Signature(s) Signed: 08/28/2020 3:13:44 PM By: Worthy Keeler PA-C Entered By: Worthy Keeler on 08/28/2020 15:13:43 -------------------------------------------------------------------------------- Physician Orders Details Patient Name: Date of Service: [place], [place] RBA RA E. 08/28/2020 2:00 PM Medical Record Number: [id number] Patient Account Number: [id number] Date of Birth/Sex: Treating RN: [birth year] (75 y.o. [name] Other Clinician: Referring Provider: Treating Provider/Extender: Adele Schilder in Treatment: 41 Verbal / Phone Orders: No Diagnosis Coding ICD-10 Coding Code Description E11.621 Type 2 diabetes mellitus with foot ulcer L97.522 Non-pressure chronic ulcer of other part of left foot with fat layer exposed M14.672 Charcot's joint, left ankle and foot E11.40 Type 2 diabetes mellitus with diabetic neuropathy, unspecified Follow-up Appointments Return Appointment in 1 week. Bathing/ Shower/ Hygiene May shower and wash wound with soap and water. Off-Loading Open toe surgical shoe to: - left foot Wound Treatment Wound #1RR - Foot Wound Laterality: Plantar, Left, Medial Cleanser: Soap and Water Every Other Day/7  Days Discharge Instructions: May shower and wash wound with dial antibacterial soap and water prior to dressing change. Prim Dressing: KerraCel Ag Gelling Fiber Dressing, 2x2 in (silver alginate) Every Other Day/7 Days ary Discharge Instructions: Apply silver alginate to wound bed as instructed Secondary Dressing: Woven Gauze Sponges 2x2 in Every Other Day/7 Days Discharge Instructions: Apply over primary dressing as directed. Secondary Dressing: Optifoam Non-Adhesive Dressing, 4x4 in Every Other Day/7 Days Discharge Instructions: Apply over primary dressing cut to make foam donut Secured With: Conforming Stretch Gauze Bandage, Sterile 2x75 (in/in) Every Other Day/7 Days Discharge Instructions: Secure with stretch gauze as directed. Secured With: 85M Medipore H Soft Cloth Surgical T 4 x 2 (in/yd) Every Other Day/7 Days ape Discharge Instructions: Secure dressing with tape as directed. Wound #6 - Foot Prim Dressing: KerraCel Ag Gelling Fiber Dressing, 2x2 in (silver alginate) Every Other Day/7 Days ary Discharge Instructions: Apply silver alginate to wound bed as instructed Secondary Dressing: Woven Gauze Sponges 2x2 in Every Other Day/7 Days Discharge Instructions: Apply over primary dressing as directed. Secured With: Child psychotherapist, Sterile 2x75 (in/in) Every Other Day/7 Days Discharge Instructions: Secure with stretch gauze as directed. Electronic Signature(s) Signed: 08/28/2020 5:51:30 PM By: [name] RN, BSN Signed: 08/28/2020 6:05:28 PM By: Worthy Keeler PA-C Entered By: [name] on 08/28/2020 15:09:25 -------------------------------------------------------------------------------- Problem List Details Patient Name: Date of Service: [name] 08/28/2020 2:00 PM Medical Record Number: [id number] Patient Account Number: [id number] Date of Birth/Sex: Treating RN: Mar 10, 1946 (75 y.o. [name] Other Clinician: Referring Provider: Treating Provider/Extender: Adele Schilder in Treatment: 59 Active Problems ICD-10 Encounter Code Description Active Date MDM Diagnosis E11.621 Type 2 diabetes mellitus with foot ulcer 01/17/2020 No Yes L97.522 Non-pressure chronic ulcer of other part of left foot with fat layer exposed 01/17/2020 No Yes M14.672 Charcot's joint, left ankle and foot 01/17/2020 No Yes E11.40 Type 2 diabetes mellitus with diabetic neuropathy, unspecified 01/17/2020 No Yes Inactive Problems ICD-10 Code Description Active Date Inactive  Date I10 Essential (primary) hypertension 01/17/2020 01/17/2020 Resolved Problems Electronic Signature(s) Signed: 08/28/2020 2:23:26 PM By: Worthy Keeler PA-C Entered By: Worthy Keeler on 08/28/2020 14:23:26 -------------------------------------------------------------------------------- Progress Note Details Patient Name: Date of Service: Kl[place]z, [place] RBA RA E. 08/28/2020 2:00 PM Medical Record Number: [id number] Patient Account Number: [id number] Date of Birth/Sex: Treating RN: May 02, 1946 (75 y.o. [name] Other Clinician: Referring Provider: Treating Provider/Extender: Adele Schilder in Treatment: 40 Subjective Chief Complaint Information obtained from Patient Left foot ulcer History of Present Illness (HPI) 01/17/2020 upon evaluation today patient presents for initial evaluation here in our clinic concerning issues she has been having with a left medial/plantar foot ulcer. This is actually been an issue for her since October 2020. She has been seeing Dr. Doran Durand for quite some time during that course. Fortunately there is no signs of active infection at this time. Or least no mention of this to have seen in general. With that being said unfortunately I do see some signs of erythema noted today that does have me concerned about the possibility of  infection at this point in the surrounding area of the wound. There is also a warm to touch at the site which is somewhat concerning. Fortunately there is no evidence of systemic infection which is great news. The patient does have a history of diabetes mellitus type 2, Charcot foot which is what led to the wound, and hypertension. She notes that she was in a cast for some time with Dr. Doran Durand for about 8 weeks. During that time they were utilizing according to the patient silver nitrate along with a foam doughnut and then Coban to secure in place in the cast in place. With that being said I do not have the actual records to review we are going to try to get a hold of those unfortunately they would not flow over into care everywhere I did look today. She has been seeing Dr. Doran Durand and his physician assistant Larkin Ina up until the end of May and apparently is still seeing them on a regular basis every 2 weeks roughly. She has also tried Iodosorb without effect here. 01/24/2020 upon evaluation today patient actually appears to be doing quite well with regard to her wounds. She has been tolerating the dressing changes without complication. Fortunately there is no signs of active infection spreading which is good news. Her culture did show signs of Staph aureus I did place her on Augmentin due to the erythema surrounding the wound. With that being said the wound does appear to be doing better she has her longer walking cast/boot and I think that is actually good for her for the time being. I am considering reinitiating total contact cast when she gets back from vacation but next week she will actually be out of town at [employer] she knows not to get in the water but she still obviously is planning to enjoy herself she is going to take it easy on her foot however. 02/07/2020 upon evaluation today patient appears to be doing fairly well in regard to her ulcer on her foot. Fortunately there is no signs of severe  infection at this time which is great news and overall very pleased in that regard. With that being said I do think that she could still benefit from a total contact cast. Nonetheless she is using her walking boot which at least provide some protection and that it prevents some of the  friction occurring when she is ambulating. 02/14/2020 upon evaluation today patient appears to be doing well with regard to her foot ulcer. This is actually measuring a little bit smaller yet again this week. Overall very pleased with where things stand and I do not see any signs of active infection at this time which is also good news. Since she is measuring better the patient has wanting to somewhat hold off on proceeding with the total contact cast which I think is reasonable at this point. 02/28/2020 on evaluation today patient appears to be doing well in general in regard to her wound although she has a lot of callus buildup as compared to last time I saw her. This is can require sharp debridement today. I do believe she really needs the total contact cast as well which we have discussed previous. 7/23; patient comes in for a total contact cast change 03/06/2020 on evaluation today patient appears to be doing quite well with regard to her wounds. Fortunately the wound bed is measuring smaller and looking much better there is little callus noted although there is some debridement necessary today. 03/13/2020 on evaluation today patient's wound actually appears to be doing excellent which is great news. With that being said unfortunately she is having some issues currently with her left leg where she does have cellulitis it appears. This may have come from an area that rubbed underneath the cast from last week that we noted we padded that area and it looks to be doing excellent at this point but nonetheless the leg was somewhat painful, swollen, and somewhat erythematous. She also had an elevated white blood cell count of  11.5 based on what I saw on looking at her records from the med center in [facility] from where she was seen yesterday. Unfortunately with [place] having a provider on vacation there was no one here in the clinic in the afternoon when she called therefore she went to the ER as advised. Subsequently they did not cut off the cast as they did not have anyone from orthopedics there to do so and subsequently also did not have the ability to do the Doppler for evaluation of DVT They recommended therefore given her dose of Eliquis as well as . Augmentin and sent her home to come see [place] today to have the cast taken off and then she is supposed to go back to have the study for DVT performed they are following. 03/20/2020 upon evaluation today patient appears to be doing well with regard to her foot all things considered we have not been able to use the total contact cast due to the infection that she had last week. She has been on the doxycycline and she had a 10-day supply of that I do believe that is helping and her leg appears to be doing better. With that being said there is fortunately no signs of active infection systemically at this time which is good news. No fevers, chills, nausea, vomiting, or diarrhea. 03/27/2020 upon evaluation today patient appears to be doing well with regard to her foot ulcer. There does not appear to be signs of active infection which is great news. Overall I am very pleased with where things stand at this point. 04/03/2020 upon evaluation today patient appears to be doing pretty well in regard to the overall appearance of her wound. Fortunately there is no signs of active infection at this time which is great news. No fevers, chills, nausea, vomiting, or diarrhea. With that  being said she does have some blue-green drainage that actually is a little bit concerning to me for the possibility of Pseudomonas. I discussed that with the patient today. With that being said I do believe that  we may be able to manage this however with the topical antibiotic cream as opposed to having to do anything oral especially since she seems to be doing so well with overall appearance of the wound. 04/10/2020 on evaluation today patient appears to be doing about the same roughly in regard to the overall size of her wound. With that being said she fortunately has not shown any signs of worsening overall which is good news. I do believe that she is doing a great job trying to offload but again she may still do better with the cast. I do not see in the blue-green drainage that we noticed previously I do believe the gentamicin help in this regard. 04/17/2020 on evaluation today patient's wound appears to be doing about the same at this point. There is no significant improvement at this point. No fever chills noted. She is up for put the cast back on the day. That she states in a couple weeks she will need to have this off to go to a workshop. 04/24/2020 on evaluation today patient appears to be doing significantly better in regard to her wound. Fortunately there is no signs of active infection and overall feel like she is making great progress the cast seems to have done excellent for her. 05/01/2020 upon evaluation today patient presents for reevaluation she really does not appear to be doing too badly in regard to the actual wound on the left foot we have been managing. Unfortunately she has bilateral lower extremity edema with blisters between the webspace of her first and second toe on both feet. She has a tremendous amount of edema in the legs which I think is where this is coming from it does not appear to be infected but nonetheless I do believe this is can be something that needs to be addressed today. Obviously this means we probably will not be putting the cast on at this point. She attributes this to the fact that she was sitting with her feet on the floor much longer during a conference last week she  had a great time but unfortunately had a lot of complications as a result. 05/08/2020 upon evaluation today patient appears to be doing somewhat better in regard to her wounds at this time. Fortunately there is no signs of active infection which is great news. With that being said I do believe that the blisters have ruptured and unfortunately did not just reattach I will remove some of the blistered tissue today. With that being said I do think the wound itself on the plantar aspect of left foot does need to have sharp debridement. 05/15/2020 upon evaluation today patient appears to be doing about the same in regard to her foot ulcer. Unfortunately in the past week her husband had a fall where he sustained a mild traumatic brain bleed. Fortunately he is doing better but being that he was in the hospital she had a walk on this a lot more. The wound does not appear to be any better is also not really appearing to be significantly worse which is good news. There is no signs of active infection at this time. 10/14; patient with a small diabetic wound on the medial part of her left foot. We have been using silver collagen a  total contact cast making good progress. I think the patient had a series of blisters on her dorsal foot probably secondary to having her legs recumbent for 3 days while in a conference in [place]. We wrapped her leg last week these are all healed. We did not previously have her in compression on the right leg. 05/29/2020 upon evaluation today patient appears to be doing well with regard to the wound on the plantar aspect of her foot medially. This is measuring smaller and looking much better than last time I saw her. Again when I did see her last was 2 weeks back and the wound was significantly larger. I do believe the cast is helping and I believe the collagen is a good option for her. 06/05/2020 on evaluation today patient appears to be doing well with regard to her foot ulcer this is  actually measuring significantly better and overall I feel like she is doing excellent. There is no signs of active infection at this time. 06/12/2020 upon evaluation today patient actually continues to show signs of good improvement which is excellent news. I am extremely pleased with how she seems to be progressing at this point in regard to her wound. There is still some depth to the wound but I do believe the collagen is helping her quite a bit. 06/19/2020 upon evaluation today patient appears to be doing well with regard to her plantar foot ulcer. She is actually making excellent progress and in fact this appears to be almost completely healed. With that being said I do believe that the patient is going to actually require 1 more week in the cast although after that I am hopeful she will be ready for discharge. 06/26/2020 on evaluation today patient appears to be doing well in regard to her wound currently. Fortunately there is no signs of active infection in general I feel like she is doing excellent. This appears to be completely healed I think she is ready to come out of the cast. 11/29; patient comes back in the clinic today with a very quick reopening in the exact same area on the medial plantar foot. She had been healed out last time. She went back into some new balance shoes that she got at [employer] with a custom insert. As it turns out this wound also happened when wearing these shoes although there was some modification made I think with the wound initially happened they added some foam around the wound area. This obviously is not going to be sufficient. 07/17/2020 on evaluation today patient appears to be doing well with regard to her wound. Fortunately she seems to be making good progress. Unfortunately she was in the hospital due to a issue with colitis and had just been discharged today in fact. She tells me that her biggest concern here is that a lot of her numbers especially her  creatinine were somewhat elevated and problematic. She is can be following up with her provider in order to have a further work-up at this time. With that being said she did need to come back in for a cast change. She did not allow them to remove the cast due to the fact that she did not feel like that was the issue whatsoever and indeed it does not appear that was the case her wound appears to be doing excellent today. 07/24/2020 upon evaluation today patient appears to be doing well in regard to her foot ulcer. She still has a small opening but this is showing  signs of excellent improvement overall but they were very close to complete resolution. No fevers, chills, nausea, vomiting, or diarrhea. 07/31/2020 upon evaluation today patient actually appears to be doing excellent she is actually completely healed this is great news. Fortunately there is no signs of active infection at this time. No fevers, chills, nausea, vomiting, or diarrhea. The patient tells me that she did see Dr. Charlsie Merles in order to get to [facility] so that she can have a custom shoe made. She saw him earlier last week. She does have an appointment with Raiford Noble on the sixth I believe on January she tells me Readmission: 08/28/2020 on evaluation today patient appears to be doing poorly in regard to her wound currently. She tells me this has reopened. The good news that she did get measured for and actually her shoes are on the way in from [facility].. With that being said she has been trying to stay off of this is much as possible using her wheelchair around home. Nonetheless has been somewhat difficult. The good news is her shoes should be here next week Objective [employer] Well-nourished and well-hydrated in no acute distress. Vitals Time Taken: 2:16 PM, Height: 66 in, Weight: 245 lbs, BMI: 39.5, Temperature: 98.5 F, Pulse: 81 bpm, Respiratory Rate: 18 breaths/min, Blood Pressure: 153/73 mmHg. Respiratory normal breathing  without difficulty. Psychiatric this patient is able to make decisions and demonstrates good insight into disease process. Alert and Oriented x 3. pleasant and cooperative. General Notes: Patient's wound bed currently did require some sharp debridement. It does not appear to be nearly as deep as what it was in the past and there was some callus around the edges of the wound that I did have to perform debridement. There was also a little bit of slough on the surface of the wound. I debrided everything down to good subcutaneous tissue and the patient did have the callus removed as well post debridement everything appears to be doing excellent. Integumentary (Hair, Skin) Wound #1RR status is Open. Original cause of wound was Gradually Appeared. The wound is located on the Left,Medial,Plantar Foot. The wound measures 0.3cm length x 0.4cm width x 0.1cm depth; 0.094cm^2 area and 0.009cm^3 volume. Wound #6 status is Open. Original cause of wound was Trauma. The wound is located on the Foot. The wound measures 0.4cm length x 0.1cm width x 0.1cm depth; 0.031cm^2 area and 0.003cm^3 volume. Assessment Active Problems ICD-10 Type 2 diabetes mellitus with foot ulcer Non-pressure chronic ulcer of other part of left foot with fat layer exposed Charcot's joint, left ankle and foot Type 2 diabetes mellitus with diabetic neuropathy, unspecified Procedures Wound #1RR Pre-procedure diagnosis of Wound #1RR is a Diabetic Wound/Ulcer of the Lower Extremity located on the Left,Medial,Plantar Foot .Severity of Tissue Pre Debridement is: Fat layer exposed. There was a Excisional Skin/Subcutaneous Tissue Debridement with a total area of 1 sq cm performed by Lenda Kelp, PA. With the following instrument(s): Curette to remove Viable and Non-Viable tissue/material. Material removed includes Callus, Subcutaneous Tissue, Slough, and Skin: Epidermis after achieving pain control using Lidocaine 5% topical ointment. No  specimens were taken. A time out was conducted at 15:00, prior to the start of the procedure. A Minimum amount of bleeding was controlled with Pressure. The procedure was tolerated well with a pain level of 0 throughout and a pain level of 0 following the procedure. Post Debridement Measurements: 0.3cm length x 0.4cm width x 0.1cm depth; 0.009cm^3 volume. Character of Wound/Ulcer Post Debridement is improved.  Severity of Tissue Post Debridement is: Fat layer exposed. Post procedure Diagnosis Wound #1RR: Same as Pre-Procedure Plan Follow-up Appointments: Return Appointment in 1 week. Bathing/ Shower/ Hygiene: May shower and wash wound with soap and water. Off-Loading: Open toe surgical shoe to: - left foot WOUND #1RR: - Foot Wound Laterality: Plantar, Left, Medial Cleanser: Soap and Water Every Other Day/7 Days Discharge Instructions: May shower and wash wound with dial antibacterial soap and water prior to dressing change. Prim Dressing: KerraCel Ag Gelling Fiber Dressing, 2x2 in (silver alginate) Every Other Day/7 Days ary Discharge Instructions: Apply silver alginate to wound bed as instructed Secondary Dressing: Woven Gauze Sponges 2x2 in Every Other Day/7 Days Discharge Instructions: Apply over primary dressing as directed. Secondary Dressing: Optifoam Non-Adhesive Dressing, 4x4 in Every Other Day/7 Days Discharge Instructions: Apply over primary dressing cut to make foam donut Secured With: Conforming Stretch Gauze Bandage, Sterile 2x75 (in/in) Every Other Day/7 Days Discharge Instructions: Secure with stretch gauze as directed. Secured With: 8M Medipore H Soft Cloth Surgical T 4 x 2 (in/yd) Every Other Day/7 Days ape Discharge Instructions: Secure dressing with tape as directed. WOUND #6: - Foot Wound Laterality: Prim Dressing: KerraCel Ag Gelling Fiber Dressing, 2x2 in (silver alginate) Every Other Day/7 Days ary Discharge Instructions: Apply silver alginate to wound bed as  instructed Secondary Dressing: Woven Gauze Sponges 2x2 in Every Other Day/7 Days Discharge Instructions: Apply over primary dressing as directed. Secured With: Insurance underwriter, Sterile 2x75 (in/in) Every Other Day/7 Days Discharge Instructions: Secure with stretch gauze as directed. 1. Would recommend currently that we actually initiate treatment with a silver alginate dressing I think this is good to be the best way to go and the patient is in agreement with the plan. 2. I am also going to suggest that the patient monitor for any signs of worsening infection if she develops any issues such as fever, chills, nausea, vomiting, diarrhea, or drainage that is unusual from the wound she should let me know as soon as possible. 3. I am going to suggest that she use the offloading sandal that we have given her previously she states she will definitely do that. She also continue to use her wheelchair is much as possible. We will see patient back for reevaluation in 1 week here in the clinic. If anything worsens or changes patient will contact our office for additional recommendations. Electronic Signature(s) Signed: 08/28/2020 3:14:34 PM By: Lenda Kelp PA-C Entered By: Lenda Kelp on 08/28/2020 15:14:34 -------------------------------------------------------------------------------- SuperBill Details Patient Name: Date of Service: Tegethoff, [name] 08/28/2020 Medical Record Number: [id number] Patient Account Number: [id number] Date of Birth/Sex: Treating RN: 1946/06/19 (75 y.o. [name] Primary Care Provider: Cheryll Cockayne Other Clinician: Referring Provider: Treating Provider/Extender: Rickard Patience in Treatment: 32 Diagnosis Coding ICD-10 Codes Code Description E11.621 Type 2 diabetes mellitus with foot ulcer L97.522 Non-pressure chronic ulcer of other part of left foot with fat layer exposed M14.672 Charcot's joint, left ankle and  foot E11.40 Type 2 diabetes mellitus with diabetic neuropathy, unspecified Facility Procedures CPT4 Code: [id number] Description: 11042 - DEB SUBQ TISSUE 20 SQ CM/< ICD-10 Diagnosis Description L97.522 Non-pressure chronic ulcer of other part of left foot with fat layer exposed Modifier: Quantity: 1 Physician Procedures : CPT4 Code Description Modifier [id number] 11042 - WC PHYS SUBQ TISS 20 SQ CM ICD-10 Diagnosis Description L97.522 Non-pressure chronic ulcer of other part of left foot with fat layer exposed Quantity: 1 Electronic Signature(s)  Signed: 08/28/2020 3:14:40 PM By: Worthy Keeler PA-C Entered By: Worthy Keeler on 08/28/2020 15:14:39

## 2020-08-29 ENCOUNTER — Other Ambulatory Visit: Payer: Medicare Other | Admitting: Orthotics

## 2020-08-29 ENCOUNTER — Encounter: Payer: Self-pay | Admitting: Internal Medicine

## 2020-08-29 NOTE — Progress Notes (Signed)
MY, RINKE ([id number]) Visit Report for 08/28/2020 Arrival Information Details Patient Name: Date of Service: [name] [name], [name] E. 08/28/2020 2:00 PM Medical Record Number: [id number] Patient Account Number: [id number] Date of Birth/Sex: Treating RN: 1945/08/20 (75 y.o. [name] [name], [name] [name] Primary Care Tyrone Pautsch: [name] Other Clinician: Referring Siddhanth Denk: Treating Nashaly Dorantes/Extender: Adele Schilder in Treatment: 58 Visit Information History Since Last Visit Added or deleted any medications: No Patient Arrived: [name] [name] Any new allergies or adverse reactions: No Arrival Time: 14:10 Had a fall or experienced change in No Accompanied By: self activities of daily living that may affect Transfer Assistance: None risk of falls: Patient Identification Verified: Yes Signs or symptoms of abuse/neglect since last visito No Secondary Verification Process Completed: Yes Hospitalized since last visit: No Patient Requires Transmission-Based Precautions: No Implantable device outside of the clinic excluding No Patient Has Alerts: No cellular tissue based products placed in the center since last visit: Has Dressing in Place as Prescribed: Yes Pain Present Now: No Electronic Signature(s) Signed: 08/29/2020 2:45:22 PM By: Sandre Kitty Entered By: Sandre Kitty on 08/28/2020 14:14:48 -------------------------------------------------------------------------------- Clinic Level of Care Assessment Details Patient Name: Date of Service: [name] [name], [name] E. 08/28/2020 2:00 PM Medical Record Number: [id number] Patient Account Number: [id number] Date of Birth/Sex: Treating RN: 03/10/1946 (75 y.o. [name] [name] Primary Care Rhen Kawecki: [name] Other Clinician: Referring Shantanique Hodo: Treating Taygen Acklin/Extender: Adele Schilder in Treatment: 32 Clinic Level of Care Assessment Items TOOL 1 Quantity Score []  - 0 Use when EandM and Procedure is  performed on INITIAL visit ASSESSMENTS - Nursing Assessment / Reassessment X- 1 20 General Physical Exam (combine w/ comprehensive assessment (listed just below) when performed on new pt. evals) X- 1 25 Comprehensive Assessment (HX, ROS, Risk Assessments, Wounds Hx, etc.) ASSESSMENTS - Wound and Skin Assessment / Reassessment []  - 0 Dermatologic / Skin Assessment (not related to wound area) ASSESSMENTS - Ostomy and/or Continence Assessment and Care []  - 0 Incontinence Assessment and Management []  - 0 Ostomy Care Assessment and Management (repouching, etc.) PROCESS - Coordination of Care X - Simple Patient / Family Education for ongoing care 1 15 []  - 0 Complex (extensive) Patient / Family Education for ongoing care X- 1 10 Staff obtains Programmer, systems, Records, T Results / Process Orders est []  - 0 Staff telephones HHA, Nursing Homes / Clarify orders / etc []  - 0 Routine Transfer to another Facility (non-emergent condition) []  - 0 Routine Hospital Admission (non-emergent condition) X- 1 15 New Admissions / Biomedical engineer / Ordering NPWT Apligraf, etc. , []  - 0 Emergency Hospital Admission (emergent condition) PROCESS - Special Needs []  - 0 Pediatric / Minor Patient Management []  - 0 Isolation Patient Management []  - 0 Hearing / Language / Visual special needs []  - 0 Assessment of Community assistance (transportation, D/C planning, etc.) []  - 0 Additional assistance / Altered mentation []  - 0 Support Surface(s) Assessment (bed, cushion, seat, etc.) INTERVENTIONS - Miscellaneous []  - 0 External ear exam []  - 0 Patient Transfer (multiple staff / Civil Service fast streamer / Similar devices) []  - 0 Simple Staple / Suture removal (25 or less) []  - 0 Complex Staple / Suture removal (26 or more) []  - 0 Hypo/Hyperglycemic Management (do not check if billed separately) []  - 0 Ankle / Brachial Index (ABI) - do not check if billed separately Has the patient been seen at the  hospital within the last three years: Yes Total Score: 85 Level Of Care: New/Established -  Level 3 Electronic Signature(s) Signed: 08/28/2020 5:51:30 PM By: [name] RN, BSN Entered By: [name] on 08/28/2020 15:03:46 -------------------------------------------------------------------------------- Encounter Discharge Information Details Patient Name: Date of Service: [name] [name] RBA RA E. 08/28/2020 2:00 PM Medical Record Number: [id number] Patient Account Number: [id number] Date of Birth/Sex: Treating RN: Mar 30, 1946 (75 y.o. [name] [name], [name] [name] Primary Care Koran Seabrook: [name] Other Clinician: Referring Cuyler Vandyken: Treating Nike Southwell/Extender: Adele Schilder in Treatment: 90 Encounter Discharge Information Items Post Procedure Vitals Discharge Condition: Stable Temperature (F): 98.5 Ambulatory Status: Ambulatory Pulse (bpm): 81 Discharge Destination: Home Respiratory Rate (breaths/min): 17 Transportation: Private Auto Blood Pressure (mmHg): 153/73 Accompanied By: self Schedule Follow-up Appointment: Yes Clinical Summary of Care: Patient Declined Electronic Signature(s) Signed: 08/28/2020 5:25:13 PM By: Rhae Hammock RN Entered By: Rhae Hammock on 08/28/2020 15:31:59 -------------------------------------------------------------------------------- Lower Extremity Assessment Details Patient Name: Date of Service: [name] [name] 08/28/2020 2:00 PM Medical Record Number: [id number] Patient Account Number: [id number] Date of Birth/Sex: Treating RN: 1946/05/01 (75 y.o. [name] [name] Primary Care Press Casale: [name] Other Clinician: Referring Ether Goebel: Treating Jonelle Bann/Extender: Landis Martins Weeks in Treatment: 32 Edema Assessment Assessed: [Left: Yes] [Right: No] Edema: [Left: N] [Right: o] Calf Left: Right: Point of Measurement: 29 cm From Medial Instep 35 cm Ankle Left: Right: Point of Measurement: 12 cm  From Medial Instep 22 cm Knee To Floor Left: Right: From Medial Instep 43 cm Vascular Assessment Pulses: Dorsalis Pedis Palpable: [Left:Yes] Electronic Signature(s) Signed: 08/28/2020 5:28:00 PM By: Deon Pilling Entered By: Deon Pilling on 08/28/2020 14:22:37 -------------------------------------------------------------------------------- Multi-Disciplinary Care Plan Details Patient Name: Date of Service: [name] [name] RBA RA E. 08/28/2020 2:00 PM Medical Record Number: [id number] Patient Account Number: [id number] Date of Birth/Sex: Treating RN: 29-Mar-1946 (75 y.o. [name] [name] Primary Care Chawn Spraggins: [name] Other Clinician: Referring Khiara Shuping: Treating Tanith Dagostino/Extender: Adele Schilder in Treatment: 62 Active Inactive Nutrition Nursing Diagnoses: Impaired glucose control: actual or potential Potential for alteratiion in Nutrition/Potential for imbalanced nutrition Goals: Patient/caregiver verbalizes understanding of need to maintain therapeutic glucose control per primary care physician Date Initiated: 01/17/2020 Date Inactivated: 02/14/2020 Target Resolution Date: 02/14/2020 Goal Status: Met Patient/caregiver will maintain therapeutic glucose control Date Initiated: 01/17/2020 Target Resolution Date: 09/25/2020 Goal Status: Active Interventions: Assess HgA1c results as ordered upon admission and as needed Assess patient nutrition upon admission and as needed per policy Provide education on elevated blood sugars and impact on wound healing Treatment Activities: Patient referred to Primary Care Physician for further nutritional evaluation : 01/17/2020 Notes: Wound/Skin Impairment Nursing Diagnoses: Impaired tissue integrity Knowledge deficit related to ulceration/compromised skin integrity Goals: Patient/caregiver will verbalize understanding of skin care regimen Date Initiated: 01/17/2020 Target Resolution Date: 09/25/2020 Goal Status:  Active Ulcer/skin breakdown will have a volume reduction of 30% by week 4 Date Initiated: 01/17/2020 Date Inactivated: 02/14/2020 Target Resolution Date: 02/14/2020 Goal Status: Met Ulcer/skin breakdown will have a volume reduction of 50% by week 8 Date Initiated: 02/14/2020 Date Inactivated: 03/13/2020 Target Resolution Date: 03/13/2020 Goal Status: Met Ulcer/skin breakdown will have a volume reduction of 80% by week 12 Date Initiated: 03/13/2020 Date Inactivated: 04/10/2020 Target Resolution Date: 04/10/2020 Goal Status: Unmet Unmet Reason: infection Interventions: Assess patient/caregiver ability to obtain necessary supplies Assess patient/caregiver ability to perform ulcer/skin care regimen upon admission and as needed Assess ulceration(s) every visit Provide education on ulcer and skin care Treatment Activities: Skin care regimen initiated : 01/17/2020 Topical wound management initiated : 01/17/2020 Notes: Electronic Signature(s) Signed: 08/28/2020 5:51:30  PM By: [name] RN, BSN Entered By: [name] on 08/28/2020 15:02:16 -------------------------------------------------------------------------------- Pain Assessment Details Patient Name: Date of Service: [name] [name], [name] RA E. 08/28/2020 2:00 PM Medical Record Number: [id number] Patient Account Number: [id number] Date of Birth/Sex: Treating RN: [birth year] (75 y.o. [name] [name] Primary Care Anwyn Kriegel: [name] Other Clinician: Referring Khair Chasteen: Treating Vidhi Delellis/Extender: Adele Schilder in Treatment: 32 Active Problems Location of Pain Severity and Description of Pain Patient Has Paino No Site Locations Pain Management and Medication Current Pain Management: Electronic Signature(s) Signed: 08/28/2020 5:51:30 PM By: [name] RN, BSN Signed: 08/29/2020 2:45:22 PM By: Sandre Kitty Entered By: Sandre Kitty on 08/28/2020  14:16:39 -------------------------------------------------------------------------------- Patient/Caregiver Education Details Patient Name: Date of Service: [name] [name] 1/19/2022andnbsp2:00 PM Medical Record Number: [id number] Patient Account Number: [id number] Date of Birth/Gender: Treating RN: [birth year] (75 y.o. [name] [name] Primary Care Physician: [name] Other Clinician: Referring Physician: Treating Physician/Extender: Adele Schilder in Treatment: 47 Education Assessment Education Provided To: Patient Education Topics Provided Elevated Blood Sugar/ Impact on Healing: Methods: Explain/Verbal Responses: Reinforcements needed, State content correctly Offloading: Methods: Explain/Verbal Responses: Reinforcements needed, State content correctly Wound/Skin Impairment: Methods: Explain/Verbal Responses: Reinforcements needed, State content correctly Electronic Signature(s) Signed: 08/28/2020 5:51:30 PM By: [name] RN, BSN Entered By: [name] on 08/28/2020 15:03:06 -------------------------------------------------------------------------------- Wound Assessment Details Patient Name: Date of Service: [name] [name] RBA RA E. 08/28/2020 2:00 PM Medical Record Number: [id number] Patient Account Number: [id number] Date of Birth/Sex: Treating RN: [birth year] (75 y.o. [name] [name] Primary Care Jartavious Mckimmy: [name] Other Clinician: Referring Arnelle Nale: Treating Yicel Shannon/Extender: Landis Martins Weeks in Treatment: 32 Wound Status Wound Number: 1RR Primary Etiology: Diabetic Wound/Ulcer of the Lower Extremity Wound Location: Left, Medial, Plantar Foot Wound Status: Open Wounding Event: Gradually Appeared Date Acquired: 05/11/2019 Weeks Of Treatment: 32 Clustered Wound: No Wound Measurements Length: (cm) 0.3 Width: (cm) 0.4 Depth: (cm) 0.1 Area: (cm) 0.094 Volume: (cm) 0.009 % Reduction in Area: 90.8% %  Reduction in Volume: 97.8% Wound Description Classification: Grade 2 Treatment Notes Wound #1RR (Foot) Wound Laterality: Plantar, Left, Medial Cleanser Soap and Water Discharge Instruction: May shower and wash wound with dial antibacterial soap and water prior to dressing change. Peri-Wound Care Topical Primary Dressing KerraCel Ag Gelling Fiber Dressing, 2x2 in (silver alginate) Discharge Instruction: Apply silver alginate to wound bed as instructed Secondary Dressing Woven Gauze Sponges 2x2 in Discharge Instruction: Apply over primary dressing as directed. Optifoam Non-Adhesive Dressing, 4x4 in Discharge Instruction: Apply over primary dressing cut to make foam donut Secured With Conforming Stretch Gauze Bandage, Sterile 2x75 (in/in) Discharge Instruction: Secure with stretch gauze as directed. 30M Medipore H Soft Cloth Surgical T 4 x 2 (in/yd) ape Discharge Instruction: Secure dressing with tape as directed. Compression Wrap Compression Stockings Add-Ons Electronic Signature(s) Signed: 08/28/2020 5:51:30 PM By: [name] RN, BSN Signed: 08/29/2020 2:45:22 PM By: Sandre Kitty Entered By: Sandre Kitty on 08/28/2020 14:21:24 -------------------------------------------------------------------------------- Wound Assessment Details Patient Name: Date of Service: [name] [name], [name] RA E. 08/28/2020 2:00 PM Medical Record Number: [id number] Patient Account Number: [id number] Date of Birth/Sex: Treating RN: June 21, 1946 (75 y.o. [name] [name] Primary Care Venissa Nappi: [name] Other Clinician: Referring Angala Hilgers: Treating Sostenes Kauffmann/Extender: Landis Martins Weeks in Treatment: 32 Wound Status Wound Number: 6 Primary Etiology: Abrasion Wound Location: Foot Wound Status: Open Wounding Event: Trauma Date Acquired: 08/28/2020 Weeks Of Treatment: 0 Clustered Wound: No Wound Measurements Length: (cm) Width: (cm)  Depth: (cm) Area: (cm) Volume:  (cm) 0.4 % Reduction in Area: 0.1 % Reduction in Volume: 0.1 0.031 0.003 Treatment Notes Wound #6 (Foot) Cleanser Peri-Wound Care Topical Primary Dressing KerraCel Ag Gelling Fiber Dressing, 2x2 in (silver alginate) Discharge Instruction: Apply silver alginate to wound bed as instructed Secondary Dressing Woven Gauze Sponges 2x2 in Discharge Instruction: Apply over primary dressing as directed. Secured With Conforming Stretch Gauze Bandage, Sterile 2x75 (in/in) Discharge Instruction: Secure with stretch gauze as directed. Compression Wrap Compression Stockings Add-Ons Electronic Signature(s) Signed: 08/28/2020 5:51:30 PM By: [name] RN, BSN Signed: 08/29/2020 2:45:22 PM By: Sandre Kitty Entered By: Sandre Kitty on 08/28/2020 14:21:24 -------------------------------------------------------------------------------- Vitals Details Patient Name: Date of Service: [name] [name], [name] RA E. 08/28/2020 2:00 PM Medical Record Number: [id number] Patient Account Number: [id number] Date of Birth/Sex: Treating RN: [birth year] (75 y.o. [name] [name] Primary Care Johntay Doolen: [name] Other Clinician: Referring Shaquina Gillham: Treating Cartier Washko/Extender: Adele Schilder in Treatment: 32 Vital Signs Time Taken: 14:16 Temperature (F): 98.5 Height (in): 66 Pulse (bpm): 81 Weight (lbs): 245 Respiratory Rate (breaths/min): 18 Body Mass Index (BMI): 39.5 Blood Pressure (mmHg): 153/73 Reference Range: 80 - 120 mg / dl Electronic Signature(s) Signed: 08/29/2020 2:45:22 PM By: Sandre Kitty Entered By: Sandre Kitty on 08/28/2020 14:16:33

## 2020-09-04 ENCOUNTER — Other Ambulatory Visit: Payer: Self-pay

## 2020-09-04 ENCOUNTER — Encounter (HOSPITAL_BASED_OUTPATIENT_CLINIC_OR_DEPARTMENT_OTHER): Payer: Medicare Other | Admitting: Physician Assistant

## 2020-09-04 DIAGNOSIS — L97522 Non-pressure chronic ulcer of other part of left foot with fat layer exposed: Secondary | ICD-10-CM | POA: Diagnosis not present

## 2020-09-04 DIAGNOSIS — L84 Corns and callosities: Secondary | ICD-10-CM | POA: Diagnosis not present

## 2020-09-04 DIAGNOSIS — E1140 Type 2 diabetes mellitus with diabetic neuropathy, unspecified: Secondary | ICD-10-CM | POA: Diagnosis not present

## 2020-09-04 DIAGNOSIS — E11621 Type 2 diabetes mellitus with foot ulcer: Secondary | ICD-10-CM | POA: Diagnosis not present

## 2020-09-04 DIAGNOSIS — E11610 Type 2 diabetes mellitus with diabetic neuropathic arthropathy: Secondary | ICD-10-CM | POA: Diagnosis not present

## 2020-09-04 NOTE — Progress Notes (Addendum)
CHYVONNE, LUEKE ([id number]) Visit Report for 09/04/2020 Chief Complaint Document Details Patient Name: Date of Service: [name] [name], [name] E. 09/04/2020 3:00 PM Medical Record Number: [id number] Patient Account Number: [id number] Date of Birth/Sex: Treating RN: 04-27-1946 (76 y.o. [name] Other Clinician: Referring Provider: Treating Provider/Extender: Adele Schilder in Treatment: 108 Information Obtained from: Patient Chief Complaint Left foot ulcer Electronic Signature(s) Signed: 09/04/2020 3:42:04 PM By: Worthy Keeler PA-C Entered By: Worthy Keeler on 09/04/2020 15:42:03 -------------------------------------------------------------------------------- Debridement Details Patient Name: Date of Service: [name] Amato RA E. 09/04/2020 3:00 PM Medical Record Number: [id number] Patient Account Number: [id number] Date of Birth/Sex: Treating RN: 12-21-1945 (75 y.o. [name] Other Clinician: Referring Provider: Treating Provider/Extender: Adele Schilder in Treatment: 33 Debridement Performed for Assessment: Wound #1RR Left,Medial,Plantar Foot Performed By: Physician Worthy Keeler, PA Debridement Type: Debridement Severity of Tissue Pre Debridement: Fat layer exposed Level of Consciousness (Pre-procedure): Awake and Alert Pre-procedure Verification/Time Out Yes - 16:25 Taken: Start Time: 16:25 T Area Debrided (L x W): otal 0.5 (cm) x 0.5 (cm) = 0.25 (cm) Tissue and other material debrided: Non-Viable, Callus, Skin: Epidermis Level: Skin/Epidermis Debridement Description: Selective/Open Wound Instrument: Curette Bleeding: None End Time: 16:29 Procedural Pain: 0 Post Procedural Pain: 0 Response to Treatment: Procedure was tolerated well Level of Consciousness (Post- Awake and Alert procedure): Post Debridement Measurements of Total Wound Length:  (cm) 0.3 Width: (cm) 0.2 Depth: (cm) 0.1 Volume: (cm) 0.005 Character of Wound/Ulcer Post Debridement: Improved Severity of Tissue Post Debridement: Fat layer exposed Post Procedure Diagnosis Same as Pre-procedure Electronic Signature(s) Signed: 09/04/2020 5:03:34 PM By: Worthy Keeler PA-C Signed: 09/04/2020 6:09:51 PM By: [name] RN, BSN Entered By: [name] on 09/04/2020 16:33:19 -------------------------------------------------------------------------------- HPI Details Patient Name: Date of Service: [name] [name], [name] RA E. 09/04/2020 3:00 PM Medical Record Number: [id number] Patient Account Number: [id number] Date of Birth/Sex: Treating RN: 1946-03-06 (75 y.o. [name] Other Clinician: Referring Provider: Treating Provider/Extender: Adele Schilder in Treatment: 27 History of Present Illness HPI Description: 01/17/2020 upon evaluation today patient presents for initial evaluation here in our clinic concerning issues she has been having with a left medial/plantar foot ulcer. This is actually been an issue for her since October 2020. She has been seeing Dr. Doran Durand for quite some time during that course. Fortunately there is no signs of active infection at this time. Or least no mention of this to have seen in general. With that being said unfortunately I do see some signs of erythema noted today that does have me concerned about the possibility of infection at this point in the surrounding area of the wound. There is also a warm to touch at the site which is somewhat concerning. Fortunately there is no evidence of systemic infection which is great news. The patient does have a history of diabetes mellitus type 2, Charcot foot which is what led to the wound, and hypertension. She notes that she was in a cast for some time with Dr. Doran Durand for about 8 weeks. During that time they were utilizing according to the  patient silver nitrate along with a foam doughnut and then Coban to secure in place in the cast in place. With that being said I do not have the actual records to review we are going to try to get a hold of  those unfortunately they would not flow over into care everywhere I did look today. She has been seeing Dr. Doran Durand and his physician assistant Larkin Ina up until the end of May and apparently is still seeing them on a regular basis every 2 weeks roughly. She has also tried Iodosorb without effect here. 01/24/2020 upon evaluation today patient actually appears to be doing quite well with regard to her wounds. She has been tolerating the dressing changes without complication. Fortunately there is no signs of active infection spreading which is good news. Her culture did show signs of Staph aureus I did place her on Augmentin due to the erythema surrounding the wound. With that being said the wound does appear to be doing better she has her longer walking cast/boot and I think that is actually good for her for the time being. I am considering reinitiating total contact cast when she gets back from vacation but next week she will actually be out of town at [employer] she knows not to get in the water but she still obviously is planning to enjoy herself she is going to take it easy on her foot however. 02/07/2020 upon evaluation today patient appears to be doing fairly well in regard to her ulcer on her foot. Fortunately there is no signs of severe infection at this time which is great news and overall very pleased in that regard. With that being said I do think that she could still benefit from a total contact cast. Nonetheless she is using her walking boot which at least provide some protection and that it prevents some of the friction occurring when she is ambulating. 02/14/2020 upon evaluation today patient appears to be doing well with regard to her foot ulcer. This is actually measuring a little bit smaller  yet again this week. Overall very pleased with where things stand and I do not see any signs of active infection at this time which is also good news. Since she is measuring better the patient has wanting to somewhat hold off on proceeding with the total contact cast which I think is reasonable at this point. 02/28/2020 on evaluation today patient appears to be doing well in general in regard to her wound although she has a lot of callus buildup as compared to last time I saw her. This is can require sharp debridement today. I do believe she really needs the total contact cast as well which we have discussed previous. 7/23; patient comes in for a total contact cast change 03/06/2020 on evaluation today patient appears to be doing quite well with regard to her wounds. Fortunately the wound bed is measuring smaller and looking much better there is little callus noted although there is some debridement necessary today. 03/13/2020 on evaluation today patient's wound actually appears to be doing excellent which is great news. With that being said unfortunately she is having some issues currently with her left leg where she does have cellulitis it appears. This may have come from an area that rubbed underneath the cast from last week that we noted we padded that area and it looks to be doing excellent at this point but nonetheless the leg was somewhat painful, swollen, and somewhat erythematous. She also had an elevated white blood cell count of 11.5 based on what I saw on looking at her records from the med center in [facility] from where she was seen yesterday. Unfortunately with [place] having a provider on vacation there was no one here in the  clinic in the afternoon when she called therefore she went to the ER as advised. Subsequently they did not cut off the cast as they did not have anyone from orthopedics there to do so and subsequently also did not have the ability to do the Doppler for evaluation of DVT  They recommended therefore given her dose of Eliquis as well as . Augmentin and sent her home to come see [place] today to have the cast taken off and then she is supposed to go back to have the study for DVT performed they are following. 03/20/2020 upon evaluation today patient appears to be doing well with regard to her foot all things considered we have not been able to use the total contact cast due to the infection that she had last week. She has been on the doxycycline and she had a 10-day supply of that I do believe that is helping and her leg appears to be doing better. With that being said there is fortunately no signs of active infection systemically at this time which is good news. No fevers, chills, nausea, vomiting, or diarrhea. 03/27/2020 upon evaluation today patient appears to be doing well with regard to her foot ulcer. There does not appear to be signs of active infection which is great news. Overall I am very pleased with where things stand at this point. 04/03/2020 upon evaluation today patient appears to be doing pretty well in regard to the overall appearance of her wound. Fortunately there is no signs of active infection at this time which is great news. No fevers, chills, nausea, vomiting, or diarrhea. With that being said she does have some blue-green drainage that actually is a little bit concerning to me for the possibility of Pseudomonas. I discussed that with the patient today. With that being said I do believe that we may be able to manage this however with the topical antibiotic cream as opposed to having to do anything oral especially since she seems to be doing so well with overall appearance of the wound. 04/10/2020 on evaluation today patient appears to be doing about the same roughly in regard to the overall size of her wound. With that being said she fortunately has not shown any signs of worsening overall which is good news. I do believe that she is doing a great job  trying to offload but again she may still do better with the cast. I do not see in the blue-green drainage that we noticed previously I do believe the gentamicin help in this regard. 04/17/2020 on evaluation today patient's wound appears to be doing about the same at this point. There is no significant improvement at this point. No fever chills noted. She is up for put the cast back on the day. That she states in a couple weeks she will need to have this off to go to a workshop. 04/24/2020 on evaluation today patient appears to be doing significantly better in regard to her wound. Fortunately there is no signs of active infection and overall feel like she is making great progress the cast seems to have done excellent for her. 05/01/2020 upon evaluation today patient presents for reevaluation she really does not appear to be doing too badly in regard to the actual wound on the left foot we have been managing. Unfortunately she has bilateral lower extremity edema with blisters between the webspace of her first and second toe on both feet. She has a tremendous amount of edema in the legs which I  think is where this is coming from it does not appear to be infected but nonetheless I do believe this is can be something that needs to be addressed today. Obviously this means we probably will not be putting the cast on at this point. She attributes this to the fact that she was sitting with her feet on the floor much longer during a conference last week she had a great time but unfortunately had a lot of complications as a result. 05/08/2020 upon evaluation today patient appears to be doing somewhat better in regard to her wounds at this time. Fortunately there is no signs of active infection which is great news. With that being said I do believe that the blisters have ruptured and unfortunately did not just reattach I will remove some of the blistered tissue today. With that being said I do think the wound itself  on the plantar aspect of left foot does need to have sharp debridement. 05/15/2020 upon evaluation today patient appears to be doing about the same in regard to her foot ulcer. Unfortunately in the past week her husband had a fall where he sustained a mild traumatic brain bleed. Fortunately he is doing better but being that he was in the hospital she had a walk on this a lot more. The wound does not appear to be any better is also not really appearing to be significantly worse which is good news. There is no signs of active infection at this time. 10/14; patient with a small diabetic wound on the medial part of her left foot. We have been using silver collagen a total contact cast making good progress. I think the patient had a series of blisters on her dorsal foot probably secondary to having her legs recumbent for 3 days while in a conference in [place]. We wrapped her leg last week these are all healed. We did not previously have her in compression on the right leg. 05/29/2020 upon evaluation today patient appears to be doing well with regard to the wound on the plantar aspect of her foot medially. This is measuring smaller and looking much better than last time I saw her. Again when I did see her last was 2 weeks back and the wound was significantly larger. I do believe the cast is helping and I believe the collagen is a good option for her. 06/05/2020 on evaluation today patient appears to be doing well with regard to her foot ulcer this is actually measuring significantly better and overall I feel like she is doing excellent. There is no signs of active infection at this time. 06/12/2020 upon evaluation today patient actually continues to show signs of good improvement which is excellent news. I am extremely pleased with how she seems to be progressing at this point in regard to her wound. There is still some depth to the wound but I do believe the collagen is helping her quite a bit. 06/19/2020  upon evaluation today patient appears to be doing well with regard to her plantar foot ulcer. She is actually making excellent progress and in fact this appears to be almost completely healed. With that being said I do believe that the patient is going to actually require 1 more week in the cast although after that I am hopeful she will be ready for discharge. 06/26/2020 on evaluation today patient appears to be doing well in regard to her wound currently. Fortunately there is no signs of active infection in general I feel like she  is doing excellent. This appears to be completely healed I think she is ready to come out of the cast. 11/29; patient comes back in the clinic today with a very quick reopening in the exact same area on the medial plantar foot. She had been healed out last time. She went back into some new balance shoes that she got at [employer] with a custom insert. As it turns out this wound also happened when wearing these shoes although there was some modification made I think with the wound initially happened they added some foam around the wound area. This obviously is not going to be sufficient. 07/17/2020 on evaluation today patient appears to be doing well with regard to her wound. Fortunately she seems to be making good progress. Unfortunately she was in the hospital due to a issue with colitis and had just been discharged today in fact. She tells me that her biggest concern here is that a lot of her numbers especially her creatinine were somewhat elevated and problematic. She is can be following up with her provider in order to have a further work-up at this time. With that being said she did need to come back in for a cast change. She did not allow them to remove the cast due to the fact that she did not feel like that was the issue whatsoever and indeed it does not appear that was the case her wound appears to be doing excellent today. 07/24/2020 upon evaluation today patient  appears to be doing well in regard to her foot ulcer. She still has a small opening but this is showing signs of excellent improvement overall but they were very close to complete resolution. No fevers, chills, nausea, vomiting, or diarrhea. 07/31/2020 upon evaluation today patient actually appears to be doing excellent she is actually completely healed this is great news. Fortunately there is no signs of active infection at this time. No fevers, chills, nausea, vomiting, or diarrhea. The patient tells me that she did see Dr. Paulla Dolly in order to get to [place] so that she can have a custom shoe made. She saw him earlier last week. She does have an appointment with Liliane Channel on the sixth I believe on January she tells me Readmission: 08/28/2020 on evaluation today patient appears to be doing poorly in regard to her wound currently. She tells me this has reopened. The good news that she did get measured for and actually her shoes are on the way in from [facility].. With that being said she has been trying to stay off of this is much as possible using her wheelchair around home. Nonetheless has been somewhat difficult. The good news is her shoes should be here next week 09/04/2020 upon evaluation today patient appears to be doing well with regard to her wound. There is a little bit of callus buildup but nothing too significant she does tell me she was very active this week. Fortunately there is no signs of systemic infection at this point. The dorsal foot wound actually appears to be doing better. I think this is healed. Electronic Signature(s) Signed: 09/04/2020 4:34:31 PM By: Worthy Keeler PA-C Entered By: Worthy Keeler on 09/04/2020 16:34:30 -------------------------------------------------------------------------------- Physical Exam Details Patient Name: Date of Service: [name] [name], [name] RA E. 09/04/2020 3:00 PM Medical Record Number: [id number] Patient Account Number: [id number] Date of Birth/Sex:  Treating RN: 1946-02-27 (75 y.o. [name] Other Clinician: Referring Provider: Treating Provider/Extender: Earlean Shawl,  Stacy Weeks in Treatment: 31 Constitutional Well-nourished and well-hydrated in no acute distress. Respiratory normal breathing without difficulty. Psychiatric this patient is able to make decisions and demonstrates good insight into disease process. Alert and Oriented x 3. pleasant and cooperative. Notes [facility] patient's wound did require debridement to clear away callus from the plantar aspect of the foot the patient tolerated that today without complication post debridement wound bed appears to be doing much better no subcutaneous tissue removed in this debridement Electronic Signature(s) Signed: 09/04/2020 4:34:45 PM By: Worthy Keeler PA-C Entered By: Worthy Keeler on 09/04/2020 16:34:45 -------------------------------------------------------------------------------- Physician Orders Details Patient Name: Date of Service: Freeney, Provencal RBA RA E. 09/04/2020 3:00 PM Medical Record Number: [id number] Patient Account Number: [id number] Date of Birth/Sex: Treating RN: 16-May-1946 (75 y.o. [name] Other Clinician: Referring Provider: Treating Provider/Extender: Adele Schilder in Treatment: 63 Verbal / Phone Orders: No Diagnosis Coding ICD-10 Coding Code Description E11.621 Type 2 diabetes mellitus with foot ulcer L97.522 Non-pressure chronic ulcer of other part of left foot with fat layer exposed M14.672 Charcot's joint, left ankle and foot E11.40 Type 2 diabetes mellitus with diabetic neuropathy, unspecified Follow-up Appointments Return Appointment in 1 week. Bathing/ Shower/ Hygiene May shower and wash wound with soap and water. Off-Loading Open toe surgical shoe to: - left foot Wound Treatment Wound #1RR - Foot Wound Laterality:  Plantar, Left, Medial Cleanser: Soap and Water Every Other Day/7 Days Discharge Instructions: May shower and wash wound with dial antibacterial soap and water prior to dressing change. Prim Dressing: KerraCel Ag Gelling Fiber Dressing, 2x2 in (silver alginate) Every Other Day/7 Days ary Discharge Instructions: Apply silver alginate to wound bed as instructed Secondary Dressing: Woven Gauze Sponges 2x2 in Every Other Day/7 Days Discharge Instructions: Apply over primary dressing as directed. Secondary Dressing: Optifoam Non-Adhesive Dressing, 4x4 in Every Other Day/7 Days Discharge Instructions: Apply over primary dressing cut to make foam donut Secured With: Conforming Stretch Gauze Bandage, Sterile 2x75 (in/in) Every Other Day/7 Days Discharge Instructions: Secure with stretch gauze as directed. Secured With: 32M Medipore H Soft Cloth Surgical T 4 x 2 (in/yd) Every Other Day/7 Days ape Discharge Instructions: Secure dressing with tape as directed. Wound #6 - Foot Wound Laterality: Left, Medial Prim Dressing: KerraCel Ag Gelling Fiber Dressing, 2x2 in (silver alginate) Every Other Day/7 Days ary Discharge Instructions: Apply silver alginate to wound bed as instructed Secondary Dressing: Woven Gauze Sponges 2x2 in Every Other Day/7 Days Discharge Instructions: Apply over primary dressing as directed. Secured With: Child psychotherapist, Sterile 2x75 (in/in) Every Other Day/7 Days Discharge Instructions: Secure with stretch gauze as directed. Electronic Signature(s) Signed: 09/04/2020 5:03:34 PM By: Worthy Keeler PA-C Signed: 09/04/2020 6:09:51 PM By: [name] RN, BSN Entered By: [name] on 09/04/2020 16:34:11 -------------------------------------------------------------------------------- Problem List Details Patient Name: Date of Service: [name] Guarneri RBA RA E. 09/04/2020 3:00 PM Medical Record Number: [id number] Patient Account Number: [id number] Date of  Birth/Sex: Treating RN: 1946-02-23 (75 y.o. [name] Other Clinician: Referring Provider: Treating Provider/Extender: Adele Schilder in Treatment: 74 Active Problems ICD-10 Encounter Code Description Active Date MDM Diagnosis E11.621 Type 2 diabetes mellitus with foot ulcer 01/17/2020 No Yes L97.522 Non-pressure chronic ulcer of other part of left foot with fat layer exposed 01/17/2020 No Yes M14.672 Charcot's joint, left ankle and foot 01/17/2020 No Yes E11.40 Type 2 diabetes mellitus with diabetic neuropathy,  unspecified 01/17/2020 No Yes Inactive Problems ICD-10 Code Description Active Date Inactive Date I10 Essential (primary) hypertension 01/17/2020 01/17/2020 Resolved Problems Electronic Signature(s) Signed: 09/04/2020 3:41:49 PM By: Worthy Keeler PA-C Entered By: Worthy Keeler on 09/04/2020 15:41:49 -------------------------------------------------------------------------------- Progress Note Details Patient Name: Date of Service: [name] [name], [name] RA E. 09/04/2020 3:00 PM Medical Record Number: [id number] Patient Account Number: [id number] Date of Birth/Sex: Treating RN: [birth year] (75 y.o. [name] Other Clinician: Referring Provider: Treating Provider/Extender: Adele Schilder in Treatment: 79 Subjective Chief Complaint Information obtained from Patient Left foot ulcer History of Present Illness (HPI) 01/17/2020 upon evaluation today patient presents for initial evaluation here in our clinic concerning issues she has been having with a left medial/plantar foot ulcer. This is actually been an issue for her since October 2020. She has been seeing Dr. Doran Durand for quite some time during that course. Fortunately there is no signs of active infection at this time. Or least no mention of this to have seen in general. With that being said unfortunately I do see  some signs of erythema noted today that does have me concerned about the possibility of infection at this point in the surrounding area of the wound. There is also a warm to touch at the site which is somewhat concerning. Fortunately there is no evidence of systemic infection which is great news. The patient does have a history of diabetes mellitus type 2, Charcot foot which is what led to the wound, and hypertension. She notes that she was in a cast for some time with Dr. Doran Durand for about 8 weeks. During that time they were utilizing according to the patient silver nitrate along with a foam doughnut and then Coban to secure in place in the cast in place. With that being said I do not have the actual records to review we are going to try to get a hold of those unfortunately they would not flow over into care everywhere I did look today. She has been seeing Dr. Doran Durand and his physician assistant Larkin Ina up until the end of May and apparently is still seeing them on a regular basis every 2 weeks roughly. She has also tried Iodosorb without effect here. 01/24/2020 upon evaluation today patient actually appears to be doing quite well with regard to her wounds. She has been tolerating the dressing changes without complication. Fortunately there is no signs of active infection spreading which is good news. Her culture did show signs of Staph aureus I did place her on Augmentin due to the erythema surrounding the wound. With that being said the wound does appear to be doing better she has her longer walking cast/boot and I think that is actually good for her for the time being. I am considering reinitiating total contact cast when she gets back from vacation but next week she will actually be out of town at [employer] she knows not to get in the water but she still obviously is planning to enjoy herself she is going to take it easy on her foot however. 02/07/2020 upon evaluation today patient appears to be doing  fairly well in regard to her ulcer on her foot. Fortunately there is no signs of severe infection at this time which is great news and overall very pleased in that regard. With that being said I do think that she could still benefit from a total contact cast. Nonetheless she is using her walking boot which  at least provide some protection and that it prevents some of the friction occurring when she is ambulating. 02/14/2020 upon evaluation today patient appears to be doing well with regard to her foot ulcer. This is actually measuring a little bit smaller yet again this week. Overall very pleased with where things stand and I do not see any signs of active infection at this time which is also good news. Since she is measuring better the patient has wanting to somewhat hold off on proceeding with the total contact cast which I think is reasonable at this point. 02/28/2020 on evaluation today patient appears to be doing well in general in regard to her wound although she has a lot of callus buildup as compared to last time I saw her. This is can require sharp debridement today. I do believe she really needs the total contact cast as well which we have discussed previous. 7/23; patient comes in for a total contact cast change 03/06/2020 on evaluation today patient appears to be doing quite well with regard to her wounds. Fortunately the wound bed is measuring smaller and looking much better there is little callus noted although there is some debridement necessary today. 03/13/2020 on evaluation today patient's wound actually appears to be doing excellent which is great news. With that being said unfortunately she is having some issues currently with her left leg where she does have cellulitis it appears. This may have come from an area that rubbed underneath the cast from last week that we noted we padded that area and it looks to be doing excellent at this point but nonetheless the leg was somewhat painful,  swollen, and somewhat erythematous. She also had an elevated white blood cell count of 11.5 based on what I saw on looking at her records from the med center in [facility] from where she was seen yesterday. Unfortunately with [place] having a provider on vacation there was no one here in the clinic in the afternoon when she called therefore she went to the ER as advised. Subsequently they did not cut off the cast as they did not have anyone from orthopedics there to do so and subsequently also did not have the ability to do the Doppler for evaluation of DVT They recommended therefore given her dose of Eliquis as well as . Augmentin and sent her home to come see [place] today to have the cast taken off and then she is supposed to go back to have the study for DVT performed they are following. 03/20/2020 upon evaluation today patient appears to be doing well with regard to her foot all things considered we have not been able to use the total contact cast due to the infection that she had last week. She has been on the doxycycline and she had a 10-day supply of that I do believe that is helping and her leg appears to be doing better. With that being said there is fortunately no signs of active infection systemically at this time which is good news. No fevers, chills, nausea, vomiting, or diarrhea. 03/27/2020 upon evaluation today patient appears to be doing well with regard to her foot ulcer. There does not appear to be signs of active infection which is great news. Overall I am very pleased with where things stand at this point. 04/03/2020 upon evaluation today patient appears to be doing pretty well in regard to the overall appearance of her wound. Fortunately there is no signs of active infection at this time which  is great news. No fevers, chills, nausea, vomiting, or diarrhea. With that being said she does have some blue-green drainage that actually is a little bit concerning to me for the possibility of  Pseudomonas. I discussed that with the patient today. With that being said I do believe that we may be able to manage this however with the topical antibiotic cream as opposed to having to do anything oral especially since she seems to be doing so well with overall appearance of the wound. 04/10/2020 on evaluation today patient appears to be doing about the same roughly in regard to the overall size of her wound. With that being said she fortunately has not shown any signs of worsening overall which is good news. I do believe that she is doing a great job trying to offload but again she may still do better with the cast. I do not see in the blue-green drainage that we noticed previously I do believe the gentamicin help in this regard. 04/17/2020 on evaluation today patient's wound appears to be doing about the same at this point. There is no significant improvement at this point. No fever chills noted. She is up for put the cast back on the day. That she states in a couple weeks she will need to have this off to go to a workshop. 04/24/2020 on evaluation today patient appears to be doing significantly better in regard to her wound. Fortunately there is no signs of active infection and overall feel like she is making great progress the cast seems to have done excellent for her. 05/01/2020 upon evaluation today patient presents for reevaluation she really does not appear to be doing too badly in regard to the actual wound on the left foot we have been managing. Unfortunately she has bilateral lower extremity edema with blisters between the webspace of her first and second toe on both feet. She has a tremendous amount of edema in the legs which I think is where this is coming from it does not appear to be infected but nonetheless I do believe this is can be something that needs to be addressed today. Obviously this means we probably will not be putting the cast on at this point. She attributes this to the fact  that she was sitting with her feet on the floor much longer during a conference last week she had a great time but unfortunately had a lot of complications as a result. 05/08/2020 upon evaluation today patient appears to be doing somewhat better in regard to her wounds at this time. Fortunately there is no signs of active infection which is great news. With that being said I do believe that the blisters have ruptured and unfortunately did not just reattach I will remove some of the blistered tissue today. With that being said I do think the wound itself on the plantar aspect of left foot does need to have sharp debridement. 05/15/2020 upon evaluation today patient appears to be doing about the same in regard to her foot ulcer. Unfortunately in the past week her husband had a fall where he sustained a mild traumatic brain bleed. Fortunately he is doing better but being that he was in the hospital she had a walk on this a lot more. The wound does not appear to be any better is also not really appearing to be significantly worse which is good news. There is no signs of active infection at this time. 10/14; patient with a small diabetic wound on the medial  part of her left foot. We have been using silver collagen a total contact cast making good progress. I think the patient had a series of blisters on her dorsal foot probably secondary to having her legs recumbent for 3 days while in a conference in [place]. We wrapped her leg last week these are all healed. We did not previously have her in compression on the right leg. 05/29/2020 upon evaluation today patient appears to be doing well with regard to the wound on the plantar aspect of her foot medially. This is measuring smaller and looking much better than last time I saw her. Again when I did see her last was 2 weeks back and the wound was significantly larger. I do believe the cast is helping and I believe the collagen is a good option for  her. 06/05/2020 on evaluation today patient appears to be doing well with regard to her foot ulcer this is actually measuring significantly better and overall I feel like she is doing excellent. There is no signs of active infection at this time. 06/12/2020 upon evaluation today patient actually continues to show signs of good improvement which is excellent news. I am extremely pleased with how she seems to be progressing at this point in regard to her wound. There is still some depth to the wound but I do believe the collagen is helping her quite a bit. 06/19/2020 upon evaluation today patient appears to be doing well with regard to her plantar foot ulcer. She is actually making excellent progress and in fact this appears to be almost completely healed. With that being said I do believe that the patient is going to actually require 1 more week in the cast although after that I am hopeful she will be ready for discharge. 06/26/2020 on evaluation today patient appears to be doing well in regard to her wound currently. Fortunately there is no signs of active infection in general I feel like she is doing excellent. This appears to be completely healed I think she is ready to come out of the cast. 11/29; patient comes back in the clinic today with a very quick reopening in the exact same area on the medial plantar foot. She had been healed out last time. She went back into some new balance shoes that she got at [employer] with a custom insert. As it turns out this wound also happened when wearing these shoes although there was some modification made I think with the wound initially happened they added some foam around the wound area. This obviously is not going to be sufficient. 07/17/2020 on evaluation today patient appears to be doing well with regard to her wound. Fortunately she seems to be making good progress. Unfortunately she was in the hospital due to a issue with colitis and had just been discharged  today in fact. She tells me that her biggest concern here is that a lot of her numbers especially her creatinine were somewhat elevated and problematic. She is can be following up with her provider in order to have a further work-up at this time. With that being said she did need to come back in for a cast change. She did not allow them to remove the cast due to the fact that she did not feel like that was the issue whatsoever and indeed it does not appear that was the case her wound appears to be doing excellent today. 07/24/2020 upon evaluation today patient appears to be doing well in regard to her  foot ulcer. She still has a small opening but this is showing signs of excellent improvement overall but they were very close to complete resolution. No fevers, chills, nausea, vomiting, or diarrhea. 07/31/2020 upon evaluation today patient actually appears to be doing excellent she is actually completely healed this is great news. Fortunately there is no signs of active infection at this time. No fevers, chills, nausea, vomiting, or diarrhea. The patient tells me that she did see Dr. Paulla Dolly in order to get to [place] so that she can have a custom shoe made. She saw him earlier last week. She does have an appointment with Liliane Channel on the sixth I believe on January she tells me Readmission: 08/28/2020 on evaluation today patient appears to be doing poorly in regard to her wound currently. She tells me this has reopened. The good news that she did get measured for and actually her shoes are on the way in from [facility].. With that being said she has been trying to stay off of this is much as possible using her wheelchair around home. Nonetheless has been somewhat difficult. The good news is her shoes should be here next week 09/04/2020 upon evaluation today patient appears to be doing well with regard to her wound. There is a little bit of callus buildup but nothing too significant she does tell me she was  very active this week. Fortunately there is no signs of systemic infection at this point. The dorsal foot wound actually appears to be doing better. I think this is healed. Objective Constitutional Well-nourished and well-hydrated in no acute distress. Vitals Time Taken: 4:04 PM, Height: 66 in, Weight: 245 lbs, BMI: 39.5, Temperature: 98.4 F, Pulse: 86 bpm, Respiratory Rate: 18 breaths/min, Blood Pressure: 150/78 mmHg. Respiratory normal breathing without difficulty. Psychiatric this patient is able to make decisions and demonstrates good insight into disease process. Alert and Oriented x 3. pleasant and cooperative. General Notes: [facility] patient's wound did require debridement to clear away callus from the plantar aspect of the foot the patient tolerated that today without complication post debridement wound bed appears to be doing much better no subcutaneous tissue removed in this debridement Integumentary (Hair, Skin) Wound #1RR status is Open. Original cause of wound was Gradually Appeared. The wound is located on the [place]. The wound measures 0.3cm length x 0.2cm width x 0.1cm depth; 0.047cm^2 area and 0.005cm^3 volume. There is Fat Layer (Subcutaneous Tissue) exposed. There is no tunneling or undermining noted. There is a medium amount of serosanguineous drainage noted. The wound margin is distinct with the outline attached to the wound base. There is large (67-100%) red granulation within the wound bed. There is no necrotic tissue within the wound bed. Wound #6 status is Open. Original cause of wound was Trauma. The wound is located on the Left,Medial Foot. The wound measures 0.2cm length x 0.2cm width x 0.1cm depth; 0.031cm^2 area and 0.003cm^3 volume. There is no tunneling or undermining noted. There is a small amount of serosanguineous drainage noted. The wound margin is distinct with the outline attached to the wound base. There is no granulation within the wound  bed. There is a large (67-100%) amount of necrotic tissue within the wound bed including Eschar. General Notes: scabbed over. Assessment Active Problems ICD-10 Type 2 diabetes mellitus with foot ulcer Non-pressure chronic ulcer of other part of left foot with fat layer exposed Charcot's joint, left ankle and foot Type 2 diabetes mellitus with diabetic neuropathy, unspecified Procedures Wound #1RR Pre-procedure  diagnosis of Wound #1RR is a Diabetic Wound/Ulcer of the Lower Extremity located on the Left,Medial,Plantar Foot .Severity of Tissue Pre Debridement is: Fat layer exposed. There was a Selective/Open Wound Skin/Epidermis Debridement with a total area of 0.25 sq cm performed by Worthy Keeler, PA. With the following instrument(s): Curette to remove Non-Viable tissue/material. Material removed includes Callus and Skin: Epidermis and. No specimens were taken. A time out was conducted at 16:25, prior to the start of the procedure. There was no bleeding. The procedure was tolerated well with a pain level of 0 throughout and a pain level of 0 following the procedure. Post Debridement Measurements: 0.3cm length x 0.2cm width x 0.1cm depth; 0.005cm^3 volume. Character of Wound/Ulcer Post Debridement is improved. Severity of Tissue Post Debridement is: Fat layer exposed. Post procedure Diagnosis Wound #1RR: Same as Pre-Procedure Plan Follow-up Appointments: Return Appointment in 1 week. Bathing/ Shower/ Hygiene: May shower and wash wound with soap and water. Off-Loading: Open toe surgical shoe to: - left foot WOUND #1RR: - Foot Wound Laterality: Plantar, Left, Medial Cleanser: Soap and Water Every Other Day/7 Days Discharge Instructions: May shower and wash wound with dial antibacterial soap and water prior to dressing change. Prim Dressing: KerraCel Ag Gelling Fiber Dressing, 2x2 in (silver alginate) Every Other Day/7 Days ary Discharge Instructions: Apply silver alginate to wound bed  as instructed Secondary Dressing: Woven Gauze Sponges 2x2 in Every Other Day/7 Days Discharge Instructions: Apply over primary dressing as directed. Secondary Dressing: Optifoam Non-Adhesive Dressing, 4x4 in Every Other Day/7 Days Discharge Instructions: Apply over primary dressing cut to make foam donut Secured With: Conforming Stretch Gauze Bandage, Sterile 2x75 (in/in) Every Other Day/7 Days Discharge Instructions: Secure with stretch gauze as directed. Secured With: 3M Medipore H Soft Cloth Surgical T 4 x 2 (in/yd) Every Other Day/7 Days ape Discharge Instructions: Secure dressing with tape as directed. WOUND #6: - Foot Wound Laterality: Left, Medial Prim Dressing: KerraCel Ag Gelling Fiber Dressing, 2x2 in (silver alginate) Every Other Day/7 Days ary Discharge Instructions: Apply silver alginate to wound bed as instructed Secondary Dressing: Woven Gauze Sponges 2x2 in Every Other Day/7 Days Discharge Instructions: Apply over primary dressing as directed. Secured With: Child psychotherapist, Sterile 2x75 (in/in) Every Other Day/7 Days Discharge Instructions: Secure with stretch gauze as directed. 1. Would recommend currently that we continue with the wound care measures as before the patient is in agreement the plan. This includes the use of the silver alginate dressing which I think has done well for. 2. I am also can recommend the patient continue to keep pressure off the area I think the more this she does the better. 3. I do not see any signs of infection though we will continue to keep a close eye on this. We will see patient back for reevaluation in 1 week here in the clinic. If anything worsens or changes patient will contact our office for additional recommendations. Electronic Signature(s) Signed: 09/04/2020 4:35:37 PM By: Worthy Keeler PA-C Entered By: Worthy Keeler on 09/04/2020  16:35:37 -------------------------------------------------------------------------------- SuperBill Details Patient Name: Date of Service: [name] [name], [name] RBA RA E. 09/04/2020 Medical Record Number: [id number] Patient Account Number: [id number] Date of Birth/Sex: Treating RN: 03/04/1946 (75 y.o. [name] Other Clinician: Referring Provider: Treating Provider/Extender: Adele Schilder in Treatment: 33 Diagnosis Coding ICD-10 Codes Code Description E11.621 Type 2 diabetes mellitus with foot ulcer L97.522 Non-pressure chronic ulcer of other part of left  foot with fat layer exposed M14.672 Charcot's joint, left ankle and foot E11.40 Type 2 diabetes mellitus with diabetic neuropathy, unspecified Facility Procedures CPT4 Code: [id number] Description: [phone] - DEBRIDE WOUND 1ST 20 SQ CM OR < ICD-10 Diagnosis Description L97.522 Non-pressure chronic ulcer of other part of left foot with fat layer exposed Modifier: Quantity: 1 Physician Procedures : CPT4 Code Description Modifier [id number] - WC PHYS DEBR WO ANESTH 20 SQ CM ICD-10 Diagnosis Description L97.522 Non-pressure chronic ulcer of other part of left foot with fat layer exposed Quantity: 1 Electronic Signature(s) Signed: 09/04/2020 4:35:45 PM By: Worthy Keeler PA-C Entered By: Worthy Keeler on 09/04/2020 16:35:44

## 2020-09-09 ENCOUNTER — Telehealth: Payer: Self-pay | Admitting: Podiatry

## 2020-09-09 NOTE — Telephone Encounter (Signed)
Pt left message checking on status of custom shoes that were ordered...  Left message for pt that the shoes are not in and that it maybe several more weeks due to shipping issues and supply chain issues from the manufacturer.

## 2020-09-11 ENCOUNTER — Encounter (HOSPITAL_BASED_OUTPATIENT_CLINIC_OR_DEPARTMENT_OTHER): Payer: Medicare Other | Attending: Physician Assistant | Admitting: Physician Assistant

## 2020-09-11 ENCOUNTER — Other Ambulatory Visit: Payer: Self-pay

## 2020-09-11 DIAGNOSIS — L97522 Non-pressure chronic ulcer of other part of left foot with fat layer exposed: Secondary | ICD-10-CM | POA: Diagnosis not present

## 2020-09-11 DIAGNOSIS — E1140 Type 2 diabetes mellitus with diabetic neuropathy, unspecified: Secondary | ICD-10-CM | POA: Insufficient documentation

## 2020-09-11 DIAGNOSIS — E11621 Type 2 diabetes mellitus with foot ulcer: Secondary | ICD-10-CM | POA: Insufficient documentation

## 2020-09-11 NOTE — Progress Notes (Addendum)
[name] [name], [name] [name] ([id number]) Visit Report for 09/11/2020 Chief Complaint Document Details Patient Name: Date of Service: [name], [name] 09/11/2020 12:45 PM Medical Record Number: [id number] Patient Account Number: [id number] Date of Birth/Sex: Treating RN: February 26, 1946 (75 y.o. [name] [name] Primary Care Provider: [name] Other Clinician: Referring Provider: Treating Provider/Extender: Adele Schilder in Treatment: 80 Information Obtained from: Patient Chief Complaint Left foot ulcer Electronic Signature(s) Signed: 09/11/2020 1:08:13 PM By: Worthy Keeler PA-C Entered By: Worthy Keeler on 09/11/2020 13:08:13 -------------------------------------------------------------------------------- HPI Details Patient Name: Date of Service: [name] [name], [name] [name] RBA RA E. 09/11/2020 12:45 PM Medical Record Number: [id number] Patient Account Number: [id number] Date of Birth/Sex: Treating RN: Jan 14, 1946 (75 y.o. [name] [name] Primary Care Provider: [name] Other Clinician: Referring Provider: Treating Provider/Extender: Adele Schilder in Treatment: 65 History of Present Illness HPI Description: 01/17/2020 upon evaluation today patient presents for initial evaluation here in our clinic concerning issues she has been having with a left medial/plantar foot ulcer. This is actually been an issue for her since October 2020. She has been seeing Dr. Doran Durand for quite some time during that course. Fortunately there is no signs of active infection at this time. Or least no mention of this to have seen in general. With that being said unfortunately I do see some signs of erythema noted today that does have me concerned about the possibility of infection at this point in the surrounding area of the wound. There is also a warm to touch at the site which is somewhat concerning. Fortunately there is no evidence of systemic infection which is great news. The patient  does have a history of diabetes mellitus type 2, Charcot foot which is what led to the wound, and hypertension. She notes that she was in a cast for some time with Dr. Doran Durand for about 8 weeks. During that time they were utilizing according to the patient silver nitrate along with a foam doughnut and then Coban to secure in place in the cast in place. With that being said I do not have the actual records to review we are going to try to get a hold of those unfortunately they would not flow over into care everywhere I did look today. She has been seeing Dr. Doran Durand and his physician assistant Larkin Ina up until the end of May and apparently is still seeing them on a regular basis every 2 weeks roughly. She has also tried Iodosorb without effect here. 01/24/2020 upon evaluation today patient actually appears to be doing quite well with regard to her wounds. She has been tolerating the dressing changes without complication. Fortunately there is no signs of active infection spreading which is good news. Her culture did show signs of Staph aureus I did place her on Augmentin due to the erythema surrounding the wound. With that being said the wound does appear to be doing better she has her longer walking cast/boot and I think that is actually good for her for the time being. I am considering reinitiating total contact cast when she gets back from vacation but next week she will actually be out of town at [employer] she knows not to get in the water but she still obviously is planning to enjoy herself she is going to take it easy on her foot however. 02/07/2020 upon evaluation today patient appears to be doing fairly well in regard to her ulcer on her foot. Fortunately there is no signs of  severe infection at this time which is great news and overall very pleased in that regard. With that being said I do think that she could still benefit from a total contact cast. Nonetheless she is using her walking boot which at  least provide some protection and that it prevents some of the friction occurring when she is ambulating. 02/14/2020 upon evaluation today patient appears to be doing well with regard to her foot ulcer. This is actually measuring a little bit smaller yet again this week. Overall very pleased with where things stand and I do not see any signs of active infection at this time which is also good news. Since she is measuring better the patient has wanting to somewhat hold off on proceeding with the total contact cast which I think is reasonable at this point. 02/28/2020 on evaluation today patient appears to be doing well in general in regard to her wound although she has a lot of callus buildup as compared to last time I saw her. This is can require sharp debridement today. I do believe she really needs the total contact cast as well which we have discussed previous. 7/23; patient comes in for a total contact cast change 03/06/2020 on evaluation today patient appears to be doing quite well with regard to her wounds. Fortunately the wound bed is measuring smaller and looking much better there is little callus noted although there is some debridement necessary today. 03/13/2020 on evaluation today patient's wound actually appears to be doing excellent which is great news. With that being said unfortunately she is having some issues currently with her left leg where she does have cellulitis it appears. This may have come from an area that rubbed underneath the cast from last week that we noted we padded that area and it looks to be doing excellent at this point but nonetheless the leg was somewhat painful, swollen, and somewhat erythematous. She also had an elevated white blood cell count of 11.5 based on what I saw on looking at her records from the med center in [place] from where she was seen yesterday. Unfortunately with [place] having a provider on vacation there was no one here in the clinic in the afternoon  when she called therefore she went to the ER as advised. Subsequently they did not cut off the cast as they did not have anyone from orthopedics there to do so and subsequently also did not have the ability to do the Doppler for evaluation of DVT They recommended therefore given her dose of Eliquis as well as . Augmentin and sent her home to come see [place] today to have the cast taken off and then she is supposed to go back to have the study for DVT performed they are following. 03/20/2020 upon evaluation today patient appears to be doing well with regard to her foot all things considered we have not been able to use the total contact cast due to the infection that she had last week. She has been on the doxycycline and she had a 10-day supply of that I do believe that is helping and her leg appears to be doing better. With that being said there is fortunately no signs of active infection systemically at this time which is good news. No fevers, chills, nausea, vomiting, or diarrhea. 03/27/2020 upon evaluation today patient appears to be doing well with regard to her foot ulcer. There does not appear to be signs of active infection which is great news. Overall I  am very pleased with where things stand at this point. 04/03/2020 upon evaluation today patient appears to be doing pretty well in regard to the overall appearance of her wound. Fortunately there is no signs of active infection at this time which is great news. No fevers, chills, nausea, vomiting, or diarrhea. With that being said she does have some blue-green drainage that actually is a little bit concerning to me for the possibility of Pseudomonas. I discussed that with the patient today. With that being said I do believe that we may be able to manage this however with the topical antibiotic cream as opposed to having to do anything oral especially since she seems to be doing so well with overall appearance of the wound. 04/10/2020 on evaluation  today patient appears to be doing about the same roughly in regard to the overall size of her wound. With that being said she fortunately has not shown any signs of worsening overall which is good news. I do believe that she is doing a great job trying to offload but again she may still do better with the cast. I do not see in the blue-green drainage that we noticed previously I do believe the gentamicin help in this regard. 04/17/2020 on evaluation today patient's wound appears to be doing about the same at this point. There is no significant improvement at this point. No fever chills noted. She is up for put the cast back on the day. That she states in a couple weeks she will need to have this off to go to a workshop. 04/24/2020 on evaluation today patient appears to be doing significantly better in regard to her wound. Fortunately there is no signs of active infection and overall feel like she is making great progress the cast seems to have done excellent for her. 05/01/2020 upon evaluation today patient presents for reevaluation she really does not appear to be doing too badly in regard to the actual wound on the left foot we have been managing. Unfortunately she has bilateral lower extremity edema with blisters between the webspace of her first and second toe on both feet. She has a tremendous amount of edema in the legs which I think is where this is coming from it does not appear to be infected but nonetheless I do believe this is can be something that needs to be addressed today. Obviously this means we probably will not be putting the cast on at this point. She attributes this to the fact that she was sitting with her feet on the floor much longer during a conference last week she had a great time but unfortunately had a lot of complications as a result. 05/08/2020 upon evaluation today patient appears to be doing somewhat better in regard to her wounds at this time. Fortunately there is no signs of  active infection which is great news. With that being said I do believe that the blisters have ruptured and unfortunately did not just reattach I will remove some of the blistered tissue today. With that being said I do think the wound itself on the plantar aspect of left foot does need to have sharp debridement. 05/15/2020 upon evaluation today patient appears to be doing about the same in regard to her foot ulcer. Unfortunately in the past week her husband had a fall where he sustained a mild traumatic brain bleed. Fortunately he is doing better but being that he was in the hospital she had a walk on this a lot more.  The wound does not appear to be any better is also not really appearing to be significantly worse which is good news. There is no signs of active infection at this time. 10/14; patient with a small diabetic wound on the medial part of her left foot. We have been using silver collagen a total contact cast making good progress. I think the patient had a series of blisters on her dorsal foot probably secondary to having her legs recumbent for 3 days while in a conference in [place]. We wrapped her leg last week these are all healed. We did not previously have her in compression on the right leg. 05/29/2020 upon evaluation today patient appears to be doing well with regard to the wound on the plantar aspect of her foot medially. This is measuring smaller and looking much better than last time I saw her. Again when I did see her last was 2 weeks back and the wound was significantly larger. I do believe the cast is helping and I believe the collagen is a good option for her. 06/05/2020 on evaluation today patient appears to be doing well with regard to her foot ulcer this is actually measuring significantly better and overall I feel like she is doing excellent. There is no signs of active infection at this time. 06/12/2020 upon evaluation today patient actually continues to show signs of good  improvement which is excellent news. I am extremely pleased with how she seems to be progressing at this point in regard to her wound. There is still some depth to the wound but I do believe the collagen is helping her quite a bit. 06/19/2020 upon evaluation today patient appears to be doing well with regard to her plantar foot ulcer. She is actually making excellent progress and in fact this appears to be almost completely healed. With that being said I do believe that the patient is going to actually require 1 more week in the cast although after that I am hopeful she will be ready for discharge. 06/26/2020 on evaluation today patient appears to be doing well in regard to her wound currently. Fortunately there is no signs of active infection in general I feel like she is doing excellent. This appears to be completely healed I think she is ready to come out of the cast. 11/29; patient comes back in the clinic today with a very quick reopening in the exact same area on the medial plantar foot. She had been healed out last time. She went back into some new balance shoes that she got at [employer] with a custom insert. As it turns out this wound also happened when wearing these shoes although there was some modification made I think with the wound initially happened they added some foam around the wound area. This obviously is not going to be sufficient. 07/17/2020 on evaluation today patient appears to be doing well with regard to her wound. Fortunately she seems to be making good progress. Unfortunately she was in the hospital due to a issue with colitis and had just been discharged today in fact. She tells me that her biggest concern here is that a lot of her numbers especially her creatinine were somewhat elevated and problematic. She is can be following up with her provider in order to have a further work-up at this time. With that being said she did need to come back in for a cast change. She did not  allow them to remove the cast due to the fact that  she did not feel like that was the issue whatsoever and indeed it does not appear that was the case her wound appears to be doing excellent today. 07/24/2020 upon evaluation today patient appears to be doing well in regard to her foot ulcer. She still has a small opening but this is showing signs of excellent improvement overall but they were very close to complete resolution. No fevers, chills, nausea, vomiting, or diarrhea. 07/31/2020 upon evaluation today patient actually appears to be doing excellent she is actually completely healed this is great news. Fortunately there is no signs of active infection at this time. No fevers, chills, nausea, vomiting, or diarrhea. The patient tells me that she did see Dr. Paulla Dolly in order to get to [employer] so that she can have a custom shoe made. She saw him earlier last week. She does have an appointment with Liliane Channel on the sixth I believe on January she tells me Readmission: 08/28/2020 on evaluation today patient appears to be doing poorly in regard to her wound currently. She tells me this has reopened. The good news that she did get measured for and actually her shoes are on the way in from [facility].. With that being said she has been trying to stay off of this is much as possible using her wheelchair around home. Nonetheless has been somewhat difficult. The good news is her shoes should be here next week 09/04/2020 upon evaluation today patient appears to be doing well with regard to her wound. There is a little bit of callus buildup but nothing too significant she does tell me she was very active this week. Fortunately there is no signs of systemic infection at this point. The dorsal foot wound actually appears to be doing better. I think this is healed. 09/11/2020 upon evaluation today patient appears to actually be doing quite well in my opinion based on what I am seeing today. Fortunately there is no signs  of infection in fact her mother is certain that this is even open any longer based on what I see. Fortunately I think the patient has been doing everything she can to try to keep this under control. Electronic Signature(s) Signed: 09/11/2020 1:48:07 PM By: Worthy Keeler PA-C Entered By: Worthy Keeler on 09/11/2020 13:48:07 -------------------------------------------------------------------------------- Physical Exam Details Patient Name: Date of Service: [name] [name], [name] [name] RBA RA E. 09/11/2020 12:45 PM Medical Record Number: [id number] Patient Account Number: [id number] Date of Birth/Sex: Treating RN: Sep 12, 1945 (75 y.o. [name] [name] Primary Care Provider: [name] Other Clinician: Referring Provider: Treating Provider/Extender: Adele Schilder in Treatment: 82 Constitutional Well-nourished and well-hydrated in no acute distress. Respiratory normal breathing without difficulty. Psychiatric this patient is able to make decisions and demonstrates good insight into disease process. Alert and Oriented x 3. pleasant and cooperative. Notes Upon inspection patient's wound bed actually showed signs of good granulation at this point. There does not appear to be any signs of active infection which is great news and overall I am extremely pleased with where things stand today. No fevers, chills, nausea, vomiting, or diarrhea. No sharp debridement was necessary at this point. Electronic Signature(s) Signed: 09/11/2020 1:48:24 PM By: Worthy Keeler PA-C Entered By: Worthy Keeler on 09/11/2020 13:48:24 -------------------------------------------------------------------------------- Physician Orders Details Patient Name: Date of Service: [place], [place] RBA RA E. 09/11/2020 12:45 PM Medical Record Number: [id number] Patient Account Number: [id number] Date of Birth/Sex: Treating RN: 04-27-1946 (75 y.o. [name] [name] Primary Care Provider: [name] Other  Clinician: Referring  Provider: Treating Provider/Extender: Adele Schilder in Treatment: 59 Verbal / Phone Orders: No Diagnosis Coding ICD-10 Coding Code Description E11.621 Type 2 diabetes mellitus with foot ulcer L97.522 Non-pressure chronic ulcer of other part of left foot with fat layer exposed M14.672 Charcot's joint, left ankle and foot E11.40 Type 2 diabetes mellitus with diabetic neuropathy, unspecified Follow-up Appointments Return Appointment in 1 week. Bathing/ Shower/ Hygiene May shower and wash wound with soap and water. - with dressing changes Off-Loading Open toe surgical shoe to: - left foot Wound Treatment Wound #1RR - Foot Wound Laterality: Plantar, Left, Medial Cleanser: Soap and Water Every Other Day/7 Days Discharge Instructions: May shower and wash wound with dial antibacterial soap and water prior to dressing change. Prim Dressing: KerraCel Ag Gelling Fiber Dressing, 2x2 in (silver alginate) Every Other Day/7 Days ary Discharge Instructions: Apply silver alginate to wound bed as instructed Secondary Dressing: Woven Gauze Sponges 2x2 in Every Other Day/7 Days Discharge Instructions: Apply over primary dressing as directed. Secondary Dressing: Optifoam Non-Adhesive Dressing, 4x4 in Every Other Day/7 Days Discharge Instructions: Apply over primary dressing cut to make foam donut Secured With: Conforming Stretch Gauze Bandage, Sterile 2x75 (in/in) Every Other Day/7 Days Discharge Instructions: Secure with stretch gauze as directed. Secured With: 59M Medipore H Soft Cloth Surgical T 4 x 2 (in/yd) Every Other Day/7 Days ape Discharge Instructions: Secure dressing with tape as directed. Electronic Signature(s) Signed: 09/11/2020 5:21:21 PM By: Worthy Keeler PA-C Signed: 09/11/2020 5:50:42 PM By: [name] RN, BSN Entered By: [name] on 09/11/2020 13:44:55 -------------------------------------------------------------------------------- Problem List  Details Patient Name: Date of Service: [name] 09/11/2020 12:45 PM Medical Record Number: [id number] Patient Account Number: [id number] Date of Birth/Sex: Treating RN: [birth year] (75 y.o. [name] [name] Primary Care Provider: [name] Other Clinician: Referring Provider: Treating Provider/Extender: Adele Schilder in Treatment: 44 Active Problems ICD-10 Encounter Code Description Active Date MDM Diagnosis E11.621 Type 2 diabetes mellitus with foot ulcer 01/17/2020 No Yes L97.522 Non-pressure chronic ulcer of other part of left foot with fat layer exposed 01/17/2020 No Yes M14.672 Charcot's joint, left ankle and foot 01/17/2020 No Yes E11.40 Type 2 diabetes mellitus with diabetic neuropathy, unspecified 01/17/2020 No Yes Inactive Problems ICD-10 Code Description Active Date Inactive Date I10 Essential (primary) hypertension 01/17/2020 01/17/2020 Resolved Problems Electronic Signature(s) Signed: 09/11/2020 1:08:07 PM By: Worthy Keeler PA-C Entered By: Worthy Keeler on 09/11/2020 13:08:06 -------------------------------------------------------------------------------- Progress Note Details Patient Name: Date of Service: Mirarchi, [name] 09/11/2020 12:45 PM Medical Record Number: [id number] Patient Account Number: [id number] Date of Birth/Sex: Treating RN: 1945/10/16 (75 y.o. [name] [name] Primary Care Provider: [name] Other Clinician: Referring Provider: Treating Provider/Extender: Adele Schilder in Treatment: 18 Subjective Chief Complaint Information obtained from Patient Left foot ulcer History of Present Illness (HPI) 01/17/2020 upon evaluation today patient presents for initial evaluation here in our clinic concerning issues she has been having with a left medial/plantar foot ulcer. This is actually been an issue for her since October 2020. She has been seeing Dr. Doran Durand for quite some time during that course.  Fortunately there is no signs of active infection at this time. Or least no mention of this to have seen in general. With that being said unfortunately I do see some signs of erythema noted today that does have me concerned about the possibility of infection at this point in the surrounding area  of the wound. There is also a warm to touch at the site which is somewhat concerning. Fortunately there is no evidence of systemic infection which is great news. The patient does have a history of diabetes mellitus type 2, Charcot foot which is what led to the wound, and hypertension. She notes that she was in a cast for some time with Dr. Doran Durand for about 8 weeks. During that time they were utilizing according to the patient silver nitrate along with a foam doughnut and then Coban to secure in place in the cast in place. With that being said I do not have the actual records to review we are going to try to get a hold of those unfortunately they would not flow over into care everywhere I did look today. She has been seeing Dr. Doran Durand and his physician assistant Larkin Ina up until the end of May and apparently is still seeing them on a regular basis every 2 weeks roughly. She has also tried Iodosorb without effect here. 01/24/2020 upon evaluation today patient actually appears to be doing quite well with regard to her wounds. She has been tolerating the dressing changes without complication. Fortunately there is no signs of active infection spreading which is good news. Her culture did show signs of Staph aureus I did place her on Augmentin due to the erythema surrounding the wound. With that being said the wound does appear to be doing better she has her longer walking cast/boot and I think that is actually good for her for the time being. I am considering reinitiating total contact cast when she gets back from vacation but next week she will actually be out of town at [employer] she knows not to get in the water but  she still obviously is planning to enjoy herself she is going to take it easy on her foot however. 02/07/2020 upon evaluation today patient appears to be doing fairly well in regard to her ulcer on her foot. Fortunately there is no signs of severe infection at this time which is great news and overall very pleased in that regard. With that being said I do think that she could still benefit from a total contact cast. Nonetheless she is using her walking boot which at least provide some protection and that it prevents some of the friction occurring when she is ambulating. 02/14/2020 upon evaluation today patient appears to be doing well with regard to her foot ulcer. This is actually measuring a little bit smaller yet again this week. Overall very pleased with where things stand and I do not see any signs of active infection at this time which is also good news. Since she is measuring better the patient has wanting to somewhat hold off on proceeding with the total contact cast which I think is reasonable at this point. 02/28/2020 on evaluation today patient appears to be doing well in general in regard to her wound although she has a lot of callus buildup as compared to last time I saw her. This is can require sharp debridement today. I do believe she really needs the total contact cast as well which we have discussed previous. 7/23; patient comes in for a total contact cast change 03/06/2020 on evaluation today patient appears to be doing quite well with regard to her wounds. Fortunately the wound bed is measuring smaller and looking much better there is little callus noted although there is some debridement necessary today. 03/13/2020 on evaluation today patient's wound actually appears to  be doing excellent which is great news. With that being said unfortunately she is having some issues currently with her left leg where she does have cellulitis it appears. This may have come from an area that rubbed  underneath the cast from last week that we noted we padded that area and it looks to be doing excellent at this point but nonetheless the leg was somewhat painful, swollen, and somewhat erythematous. She also had an elevated white blood cell count of 11.5 based on what I saw on looking at her records from the med center in [facility] from where she was seen yesterday. Unfortunately with [place] having a provider on vacation there was no one here in the clinic in the afternoon when she called therefore she went to the ER as advised. Subsequently they did not cut off the cast as they did not have anyone from orthopedics there to do so and subsequently also did not have the ability to do the Doppler for evaluation of DVT They recommended therefore given her dose of Eliquis as well as . Augmentin and sent her home to come see [place] today to have the cast taken off and then she is supposed to go back to have the study for DVT performed they are following. 03/20/2020 upon evaluation today patient appears to be doing well with regard to her foot all things considered we have not been able to use the total contact cast due to the infection that she had last week. She has been on the doxycycline and she had a 10-day supply of that I do believe that is helping and her leg appears to be doing better. With that being said there is fortunately no signs of active infection systemically at this time which is good news. No fevers, chills, nausea, vomiting, or diarrhea. 03/27/2020 upon evaluation today patient appears to be doing well with regard to her foot ulcer. There does not appear to be signs of active infection which is great news. Overall I am very pleased with where things stand at this point. 04/03/2020 upon evaluation today patient appears to be doing pretty well in regard to the overall appearance of her wound. Fortunately there is no signs of active infection at this time which is great news. No fevers, chills,  nausea, vomiting, or diarrhea. With that being said she does have some blue-green drainage that actually is a little bit concerning to me for the possibility of Pseudomonas. I discussed that with the patient today. With that being said I do believe that we may be able to manage this however with the topical antibiotic cream as opposed to having to do anything oral especially since she seems to be doing so well with overall appearance of the wound. 04/10/2020 on evaluation today patient appears to be doing about the same roughly in regard to the overall size of her wound. With that being said she fortunately has not shown any signs of worsening overall which is good news. I do believe that she is doing a great job trying to offload but again she may still do better with the cast. I do not see in the blue-green drainage that we noticed previously I do believe the gentamicin help in this regard. 04/17/2020 on evaluation today patient's wound appears to be doing about the same at this point. There is no significant improvement at this point. No fever chills noted. She is up for put the cast back on the day. That she states in a  couple weeks she will need to have this off to go to a workshop. 04/24/2020 on evaluation today patient appears to be doing significantly better in regard to her wound. Fortunately there is no signs of active infection and overall feel like she is making great progress the cast seems to have done excellent for her. 05/01/2020 upon evaluation today patient presents for reevaluation she really does not appear to be doing too badly in regard to the actual wound on the left foot we have been managing. Unfortunately she has bilateral lower extremity edema with blisters between the webspace of her first and second toe on both feet. She has a tremendous amount of edema in the legs which I think is where this is coming from it does not appear to be infected but nonetheless I do believe this is  can be something that needs to be addressed today. Obviously this means we probably will not be putting the cast on at this point. She attributes this to the fact that she was sitting with her feet on the floor much longer during a conference last week she had a great time but unfortunately had a lot of complications as a result. 05/08/2020 upon evaluation today patient appears to be doing somewhat better in regard to her wounds at this time. Fortunately there is no signs of active infection which is great news. With that being said I do believe that the blisters have ruptured and unfortunately did not just reattach I will remove some of the blistered tissue today. With that being said I do think the wound itself on the plantar aspect of left foot does need to have sharp debridement. 05/15/2020 upon evaluation today patient appears to be doing about the same in regard to her foot ulcer. Unfortunately in the past week her husband had a fall where he sustained a mild traumatic brain bleed. Fortunately he is doing better but being that he was in the hospital she had a walk on this a lot more. The wound does not appear to be any better is also not really appearing to be significantly worse which is good news. There is no signs of active infection at this time. 10/14; patient with a small diabetic wound on the medial part of her left foot. We have been using silver collagen a total contact cast making good progress. I think the patient had a series of blisters on her dorsal foot probably secondary to having her legs recumbent for 3 days while in a conference in [place]. We wrapped her leg last week these are all healed. We did not previously have her in compression on the right leg. 05/29/2020 upon evaluation today patient appears to be doing well with regard to the wound on the plantar aspect of her foot medially. This is measuring smaller and looking much better than last time I saw her. Again when I did  see her last was 2 weeks back and the wound was significantly larger. I do believe the cast is helping and I believe the collagen is a good option for her. 06/05/2020 on evaluation today patient appears to be doing well with regard to her foot ulcer this is actually measuring significantly better and overall I feel like she is doing excellent. There is no signs of active infection at this time. 06/12/2020 upon evaluation today patient actually continues to show signs of good improvement which is excellent news. I am extremely pleased with how she seems to be progressing at this point  in regard to her wound. There is still some depth to the wound but I do believe the collagen is helping her quite a bit. 06/19/2020 upon evaluation today patient appears to be doing well with regard to her plantar foot ulcer. She is actually making excellent progress and in fact this appears to be almost completely healed. With that being said I do believe that the patient is going to actually require 1 more week in the cast although after that I am hopeful she will be ready for discharge. 06/26/2020 on evaluation today patient appears to be doing well in regard to her wound currently. Fortunately there is no signs of active infection in general I feel like she is doing excellent. This appears to be completely healed I think she is ready to come out of the cast. 11/29; patient comes back in the clinic today with a very quick reopening in the exact same area on the medial plantar foot. She had been healed out last time. She went back into some new balance shoes that she got at [employer] with a custom insert. As it turns out this wound also happened when wearing these shoes although there was some modification made I think with the wound initially happened they added some foam around the wound area. This obviously is not going to be sufficient. 07/17/2020 on evaluation today patient appears to be doing well with regard to her  wound. Fortunately she seems to be making good progress. Unfortunately she was in the hospital due to a issue with colitis and had just been discharged today in fact. She tells me that her biggest concern here is that a lot of her numbers especially her creatinine were somewhat elevated and problematic. She is can be following up with her provider in order to have a further work-up at this time. With that being said she did need to come back in for a cast change. She did not allow them to remove the cast due to the fact that she did not feel like that was the issue whatsoever and indeed it does not appear that was the case her wound appears to be doing excellent today. 07/24/2020 upon evaluation today patient appears to be doing well in regard to her foot ulcer. She still has a small opening but this is showing signs of excellent improvement overall but they were very close to complete resolution. No fevers, chills, nausea, vomiting, or diarrhea. 07/31/2020 upon evaluation today patient actually appears to be doing excellent she is actually completely healed this is great news. Fortunately there is no signs of active infection at this time. No fevers, chills, nausea, vomiting, or diarrhea. The patient tells me that she did see Dr. Paulla Dolly in order to get to [facility] so that she can have a custom shoe made. She saw him earlier last week. She does have an appointment with Liliane Channel on the sixth I believe on January she tells me Readmission: 08/28/2020 on evaluation today patient appears to be doing poorly in regard to her wound currently. She tells me this has reopened. The good news that she did get measured for and actually her shoes are on the way in from [facility].. With that being said she has been trying to stay off of this is much as possible using her wheelchair around home. Nonetheless has been somewhat difficult. The good news is her shoes should be here next week 09/04/2020 upon evaluation today  patient appears to be doing well with regard  to her wound. There is a little bit of callus buildup but nothing too significant she does tell me she was very active this week. Fortunately there is no signs of systemic infection at this point. The dorsal foot wound actually appears to be doing better. I think this is healed. 09/11/2020 upon evaluation today patient appears to actually be doing quite well in my opinion based on what I am seeing today. Fortunately there is no signs of infection in fact her mother is certain that this is even open any longer based on what I see. Fortunately I think the patient has been doing everything she can to try to keep this under control. Objective Constitutional Well-nourished and well-hydrated in no acute distress. Vitals Time Taken: 1:00 PM, Height: 66 in, Weight: 245 lbs, BMI: 39.5, Temperature: 99.2 F, Pulse: 85 bpm, Respiratory Rate: 16 breaths/min, Blood Pressure: 150/74 mmHg. Respiratory normal breathing without difficulty. Psychiatric this patient is able to make decisions and demonstrates good insight into disease process. Alert and Oriented x 3. pleasant and cooperative. General Notes: Upon inspection patient's wound bed actually showed signs of good granulation at this point. There does not appear to be any signs of active infection which is great news and overall I am extremely pleased with where things stand today. No fevers, chills, nausea, vomiting, or diarrhea. No sharp debridement was necessary at this point. Integumentary (Hair, Skin) Wound #1RR status is Open. Original cause of wound was Gradually Appeared. The wound is located on the [place]. The wound measures 0.1cm length x 0.2cm width x 0.1cm depth; 0.016cm^2 area and 0.002cm^3 volume. There is Fat Layer (Subcutaneous Tissue) exposed. There is no tunneling or undermining noted. There is a small amount of serosanguineous drainage noted. The wound margin is distinct with  the outline attached to the wound base. There is large (67-100%) red granulation within the wound bed. There is no necrotic tissue within the wound bed. Wound #6 status is Healed - Epithelialized. Original cause of wound was Trauma. The wound is located on the Left,Medial Foot. The wound measures 0cm length x 0cm width x 0cm depth; 0cm^2 area and 0cm^3 volume. Assessment Active Problems ICD-10 Type 2 diabetes mellitus with foot ulcer Non-pressure chronic ulcer of other part of left foot with fat layer exposed Charcot's joint, left ankle and foot Type 2 diabetes mellitus with diabetic neuropathy, unspecified Plan Follow-up Appointments: Return Appointment in 1 week. Bathing/ Shower/ Hygiene: May shower and wash wound with soap and water. - with dressing changes Off-Loading: Open toe surgical shoe to: - left foot WOUND #1RR: - Foot Wound Laterality: Plantar, Left, Medial Cleanser: Soap and Water Every Other Day/7 Days Discharge Instructions: May shower and wash wound with dial antibacterial soap and water prior to dressing change. Prim Dressing: KerraCel Ag Gelling Fiber Dressing, 2x2 in (silver alginate) Every Other Day/7 Days ary Discharge Instructions: Apply silver alginate to wound bed as instructed Secondary Dressing: Woven Gauze Sponges 2x2 in Every Other Day/7 Days Discharge Instructions: Apply over primary dressing as directed. Secondary Dressing: Optifoam Non-Adhesive Dressing, 4x4 in Every Other Day/7 Days Discharge Instructions: Apply over primary dressing cut to make foam donut Secured With: Conforming Stretch Gauze Bandage, Sterile 2x75 (in/in) Every Other Day/7 Days Discharge Instructions: Secure with stretch gauze as directed. Secured With: 7M Medipore H Soft Cloth Surgical T 4 x 2 (in/yd) Every Other Day/7 Days ape Discharge Instructions: Secure dressing with tape as directed. 1. I would recommend currently that we continue with a alginate dressing  although I think this  is probably completely healed I do want to just make sure we keep this nice and dry and padded. We will also use the doughnut foam for the time being. We did recommend the possibility of her getting a reusable neoprene/slip on padding for this region in the arch to try to help her with preventing this type of issue. 2. We are also waiting for her custom shoes which have been made unfortunately there is a delay in manufacturing and shipping again often due to Covid related issues. 3. She will monitor for any signs of drainage but she has not noted any right now I really feel like again this may be completely healed I cannot be 037% certain yet. We will see patient back for reevaluation in 1 week here in the clinic. If anything worsens or changes patient will contact our office for additional recommendations. Electronic Signature(s) Signed: 09/11/2020 1:49:19 PM By: Worthy Keeler PA-C Entered By: Worthy Keeler on 09/11/2020 13:49:18 -------------------------------------------------------------------------------- SuperBill Details Patient Name: Date of Service: [name] [name], [name] 09/11/2020 Medical Record Number: [id number] Patient Account Number: [id number] Date of Birth/Sex: Treating RN: 10/09/1945 (75 y.o. [name] [name] Primary Care Provider: [name] Other Clinician: Referring Provider: Treating Provider/Extender: Adele Schilder in Treatment: 34 Diagnosis Coding ICD-10 Codes Code Description E11.621 Type 2 diabetes mellitus with foot ulcer L97.522 Non-pressure chronic ulcer of other part of left foot with fat layer exposed M14.672 Charcot's joint, left ankle and foot E11.40 Type 2 diabetes mellitus with diabetic neuropathy, unspecified Facility Procedures CPT4 Code: [id number] Description: 99213 - WOUND CARE VISIT-LEV 3 EST PT Modifier: Quantity: 1 Physician Procedures : CPT4 Code Description Modifier [id number] 99213 - WC PHYS LEVEL 3 - EST PT ICD-10  Diagnosis Description E11.621 Type 2 diabetes mellitus with foot ulcer L97.522 Non-pressure chronic ulcer of other part of left foot with fat layer exposed M14.672 Charcot's  joint, left ankle and foot E11.40 Type 2 diabetes mellitus with diabetic neuropathy, unspecified Quantity: 1 Electronic Signature(s) Signed: 09/11/2020 1:49:32 PM By: Worthy Keeler PA-C Entered By: Worthy Keeler on 09/11/2020 13:49:31

## 2020-09-12 ENCOUNTER — Telehealth: Payer: Self-pay

## 2020-09-12 DIAGNOSIS — L821 Other seborrheic keratosis: Secondary | ICD-10-CM | POA: Diagnosis not present

## 2020-09-12 DIAGNOSIS — D692 Other nonthrombocytopenic purpura: Secondary | ICD-10-CM | POA: Diagnosis not present

## 2020-09-12 DIAGNOSIS — D225 Melanocytic nevi of trunk: Secondary | ICD-10-CM | POA: Diagnosis not present

## 2020-09-12 DIAGNOSIS — L400 Psoriasis vulgaris: Secondary | ICD-10-CM | POA: Diagnosis not present

## 2020-09-12 DIAGNOSIS — B078 Other viral warts: Secondary | ICD-10-CM | POA: Diagnosis not present

## 2020-09-12 DIAGNOSIS — L814 Other melanin hyperpigmentation: Secondary | ICD-10-CM | POA: Diagnosis not present

## 2020-09-12 DIAGNOSIS — I788 Other diseases of capillaries: Secondary | ICD-10-CM | POA: Diagnosis not present

## 2020-09-12 DIAGNOSIS — L570 Actinic keratosis: Secondary | ICD-10-CM | POA: Diagnosis not present

## 2020-09-12 DIAGNOSIS — D1801 Hemangioma of skin and subcutaneous tissue: Secondary | ICD-10-CM | POA: Diagnosis not present

## 2020-09-12 NOTE — Telephone Encounter (Signed)
Key: [id number]  PA Case ID: [id number]   Outcome:Approved today  Status:Approved  Start Date:08/13/2020 -09/12/2021;   Drug Cyclobenzaprine HCl 10MG  tablets

## 2020-09-16 NOTE — Progress Notes (Signed)
BETHANNE, MULE ([id number]) Visit Report for 09/11/2020 Arrival Information Details Patient Name: Date of Service: [name] [name], [name] [name] E. 09/11/2020 12:45 PM Medical Record Number: [id number] Patient Account Number: [id number] Date of Birth/Sex: Treating RN: April 16, 1946 (75 y.o. [name] Primary Care Aleli Navedo: [name] Other Clinician: Referring Canden Cieslinski: Treating Carmelia Tiner/Extender: Adele Schilder in Treatment: 21 Visit Information History Since Last Visit Added or deleted any medications: No Patient Arrived: [name] Any new allergies or adverse reactions: No Arrival Time: 12:59 Had a fall or experienced change in No Accompanied By: self activities of daily living that may affect Transfer Assistance: None risk of falls: Patient Identification Verified: Yes Signs or symptoms of abuse/neglect since No Secondary Verification Process Completed: Yes last visito Patient Requires Transmission-Based Precautions: No Hospitalized since last visit: No Patient Has Alerts: No Implantable device outside of the clinic No excluding cellular tissue based products placed in the center since last visit: Has Dressing in Place as Prescribed: Yes Has Footwear/Offloading in Place as Yes Prescribed: Left: Surgical Shoe with Pressure Relief Insole Pain Present Now: No Electronic Signature(s) Signed: 09/11/2020 5:41:58 PM By: Deon Pilling Entered By: Deon Pilling on 09/11/2020 13:07:54 -------------------------------------------------------------------------------- Clinic Level of Care Assessment Details Patient Name: Date of Service: [name] [name], [name] [name] E. 09/11/2020 12:45 PM Medical Record Number: [id number] Patient Account Number: [id number] Date of Birth/Sex: Treating RN: 1946/05/21 (75 y.o. [name] Primary Care Jayko Voorhees: [name] Other Clinician: Referring Edlyn Rosenburg: Treating Dagoberto Nealy/Extender: Adele Schilder in Treatment: 34 Clinic Level  of Care Assessment Items TOOL 4 Quantity Score _0  - 0 Use when only an EandM is performed on FOLLOW-UP visit ASSESSMENTS - Nursing Assessment / Reassessment X- 1 10 Reassessment of Co-morbidities (includes updates in patient status) X- 1 5 Reassessment of Adherence to Treatment Plan ASSESSMENTS - Wound and Skin A ssessment / Reassessment X - Simple Wound Assessment / Reassessment - one wound 1 5 _1  - 0 Complex Wound Assessment / Reassessment - multiple wounds _2  - 0 Dermatologic / Skin Assessment (not related to wound area) ASSESSMENTS - Focused Assessment _3  - 0 Circumferential Edema Measurements - multi extremities _4  - 0 Nutritional Assessment / Counseling / Intervention X- 1 5 Lower Extremity Assessment (monofilament, tuning fork, pulses) _5  - 0 Peripheral Arterial Disease Assessment (using hand held doppler) ASSESSMENTS - Ostomy and/or Continence Assessment and Care _6  - 0 Incontinence Assessment and Management _7  - 0 Ostomy Care Assessment and Management (repouching, etc.) PROCESS - Coordination of Care X - Simple Patient / Family Education for ongoing care 1 15 _8  - 0 Complex (extensive) Patient / Family Education for ongoing care X- 1 10 Staff obtains Programmer, systems, Records, T Results / Process Orders est _9  - 0 Staff telephones HHA, Nursing Homes / Clarify orders / etc _10  - 0 Routine Transfer to another Facility (non-emergent condition) _11  - 0 Routine Hospital Admission (non-emergent condition) _12  - 0 New Admissions / Biomedical engineer / Ordering NPWT Apligraf, etc. , _13  - 0 Emergency Hospital Admission (emergent condition) X- 1 10 Simple Discharge Coordination _14  - 0 Complex (extensive) Discharge Coordination PROCESS - Special Needs _15  - 0 Pediatric / Minor Patient Management _16  - 0 Isolation Patient Management _17  - 0 Hearing / Language / Visual special needs _18  - 0 Assessment of Community assistance (transportation, D/C planning, etc.) _19  -  0 Additional assistance / Altered mentation _20  - 0 Support Surface(s) Assessment (bed, cushion, seat, etc.) INTERVENTIONS - Wound Cleansing / Measurement X - Simple  Wound Cleansing - one wound 1 5 _0  - 0 Complex Wound Cleansing - multiple wounds X- 1 5 Wound Imaging (photographs - any number of wounds) _1  - 0 Wound Tracing (instead of photographs) X- 1 5 Simple Wound Measurement - one wound _2  - 0 Complex Wound Measurement - multiple wounds INTERVENTIONS - Wound Dressings X - Small Wound Dressing one or multiple wounds 1 10 _3  - 0 Medium Wound Dressing one or multiple wounds _4  - 0 Large Wound Dressing one or multiple wounds X- 1 5 Application of Medications - topical <FUXNATFTDDUKGURK>_2<\/HCWCBJSEGBTDVVOH>_6  - 0 Application of Medications - injection INTERVENTIONS - Miscellaneous _6  - 0 External ear exam _7  - 0 Specimen Collection (cultures, biopsies, blood, body fluids, etc.) _8  - 0 Specimen(s) / Culture(s) sent or taken to Lab for analysis _9  - 0 Patient Transfer (multiple staff / Civil Service fast streamer / Similar devices) _10  - 0 Simple Staple / Suture removal (25 or less) _11  - 0 Complex Staple / Suture removal (26 or more) _12  - 0 Hypo / Hyperglycemic Management (close monitor of Blood Glucose) _13  - 0 Ankle / Brachial Index (ABI) - do not check if billed separately X- 1 5 Vital Signs Has the patient been seen at the hospital within the last three years: Yes Total Score: 95 Level Of Care: New/Established - Level 3 Electronic Signature(s) Signed: 09/11/2020 5:50:42 PM By: [name] RN, BSN Entered By: [name] on 09/11/2020 13:45:34 -------------------------------------------------------------------------------- Encounter Discharge Information Details Patient Name: Date of Service: [name] [name] E. 09/11/2020 12:45 PM Medical Record Number: [id number] Patient Account Number: [id number] Date of Birth/Sex: Treating RN: 12-08-1945 (75 y.o. [name], Lauren Primary Care Clotee Schlicker: [name] Other  Clinician: Referring Ryle Buscemi: Treating Talecia Sherlin/Extender: Adele Schilder in Treatment: 87 Encounter Discharge Information Items Discharge Condition: Stable Ambulatory Status: Ambulatory Discharge Destination: Home Transportation: Private Auto Accompanied By: self Schedule Follow-up Appointment: Yes Clinical Summary of Care: Patient Declined Electronic Signature(s) Signed: 09/16/2020 9:09:54 AM By: Rhae Hammock RN Entered By: Rhae Hammock on 09/11/2020 14:14:19 -------------------------------------------------------------------------------- Lower Extremity Assessment Details Patient Name: Date of Service: [name] [name], [name] [name] [name] E. 09/11/2020 12:45 PM Medical Record Number: [id number] Patient Account Number: [id number] Date of Birth/Sex: Treating RN: 05/06/1946 (75 y.o. [name] Primary Care Georgiann Neider: [name] Other Clinician: Referring Lex Linhares: Treating Carlyne Keehan/Extender: Landis Martins Weeks in Treatment: 34 Edema Assessment Assessed: [Left: Yes] [Right: No] Edema: [Left: Ye] [Right: s] Calf Left: Right: Point of Measurement: 29 cm From Medial Instep 38 cm Ankle Left: Right: Point of Measurement: 12 cm From Medial Instep 23.4 cm Vascular Assessment Pulses: Dorsalis Pedis Palpable: [Left:Yes] Electronic Signature(s) Signed: 09/11/2020 5:41:58 PM By: Deon Pilling Entered By: Deon Pilling on 09/11/2020 13:08:50 -------------------------------------------------------------------------------- Multi-Disciplinary Care Plan Details Patient Name: Date of Service: [name] [name] E. 09/11/2020 12:45 PM Medical Record Number: [id number] Patient Account Number: [id number] Date of Birth/Sex: Treating RN: [birth year] (75 y.o. [name] Primary Care Luman Holway: [name] Other Clinician: Referring Abrina Petz: Treating Javarie Crisp/Extender: Adele Schilder in Treatment: 61 Active Inactive Nutrition Nursing  Diagnoses: Impaired glucose control: actual or potential Potential for alteratiion in Nutrition/Potential for imbalanced nutrition Goals: Patient/caregiver verbalizes understanding of need to maintain therapeutic glucose control per primary care physician Date Initiated: 01/17/2020 Date Inactivated: 02/14/2020 Target Resolution Date: 02/14/2020 Goal Status: Met Patient/caregiver will maintain therapeutic glucose control Date Initiated: 01/17/2020 Target Resolution Date: 09/25/2020 Goal Status: Active Interventions: Assess HgA1c results as ordered upon admission and as needed  Assess patient nutrition upon admission and as needed per policy Provide education on elevated blood sugars and impact on wound healing Treatment Activities: Patient referred to Primary Care Physician for further nutritional evaluation : 01/17/2020 Notes: Wound/Skin Impairment Nursing Diagnoses: Impaired tissue integrity Knowledge deficit related to ulceration/compromised skin integrity Goals: Patient/caregiver will verbalize understanding of skin care regimen Date Initiated: 01/17/2020 Target Resolution Date: 09/25/2020 Goal Status: Active Ulcer/skin breakdown will have a volume reduction of 30% by week 4 Date Initiated: 01/17/2020 Date Inactivated: 02/14/2020 Target Resolution Date: 02/14/2020 Goal Status: Met Ulcer/skin breakdown will have a volume reduction of 50% by week 8 Date Initiated: 02/14/2020 Date Inactivated: 03/13/2020 Target Resolution Date: 03/13/2020 Goal Status: Met Ulcer/skin breakdown will have a volume reduction of 80% by week 12 Date Initiated: 03/13/2020 Date Inactivated: 04/10/2020 Target Resolution Date: 04/10/2020 Goal Status: Unmet Unmet Reason: infection Interventions: Assess patient/caregiver ability to obtain necessary supplies Assess patient/caregiver ability to perform ulcer/skin care regimen upon admission and as needed Assess ulceration(s) every visit Provide education on ulcer and skin  care Treatment Activities: Skin care regimen initiated : 01/17/2020 Topical wound management initiated : 01/17/2020 Notes: Electronic Signature(s) Signed: 09/11/2020 5:50:42 PM By: [name] RN, BSN Entered By: [name] on 09/11/2020 13:10:26 -------------------------------------------------------------------------------- Pain Assessment Details Patient Name: Date of Service: [name] [name] E. 09/11/2020 12:45 PM Medical Record Number: [id number] Patient Account Number: [id number] Date of Birth/Sex: Treating RN: Oct 10, 1945 (75 y.o. [name] Primary Care Carman Essick: [name] Other Clinician: Referring Ruqayya Ventress: Treating Eamonn Sermeno/Extender: Adele Schilder in Treatment: 12 Active Problems Location of Pain Severity and Description of Pain Patient Has Paino No Site Locations Rate the pain. Current Pain Level: 0 Pain Management and Medication Current Pain Management: Medication: No Cold Application: No Rest: No Massage: No Activity: No T.E.N.S.: No Heat Application: No Leg drop or elevation: No Is the Current Pain Management Adequate: Adequate How does your wound impact your activities of daily livingo Sleep: No Bathing: No Appetite: No Relationship With Others: No Bladder Continence: No Emotions: No Bowel Continence: No Work: No Toileting: No Drive: No Dressing: No Hobbies: No Electronic Signature(s) Signed: 09/11/2020 5:41:58 PM By: Deon Pilling Entered By: Deon Pilling on 09/11/2020 13:08:38 -------------------------------------------------------------------------------- Patient/Caregiver Education Details Patient Name: Date of Service: [name] [name] E. 2/2/2022andnbsp12:45 PM Medical Record Number: [id number] Patient Account Number: [id number] Date of Birth/Gender: Treating RN: 1945/08/11 (75 y.o. [name] Primary Care Physician: [name] Other Clinician: Referring Physician: Treating Physician/Extender:  Adele Schilder in Treatment: 68 Education Assessment Education Provided To: Patient Education Topics Provided Offloading: Methods: Explain/Verbal Responses: Reinforcements needed, State content correctly Wound/Skin Impairment: Methods: Explain/Verbal Responses: Reinforcements needed, State content correctly Electronic Signature(s) Signed: 09/11/2020 5:50:42 PM By: [name] RN, BSN Entered By: [name] on 09/11/2020 13:10:55 -------------------------------------------------------------------------------- Wound Assessment Details Patient Name: Date of Service: [name] [name] E. 09/11/2020 12:45 PM Medical Record Number: [id number] Patient Account Number: [id number] Date of Birth/Sex: Treating RN: 1946/07/25 (75 y.o. Helene Shoe, Tammi Klippel Primary Care Nikolaus Pienta: [name] Other Clinician: Referring Aireanna Luellen: Treating Alam Guterrez/Extender: Landis Martins Weeks in Treatment: 34 Wound Status Wound Number: 1RR Primary Diabetic Wound/Ulcer of the Lower Extremity Etiology: Wound Location: Left, Medial, Plantar Foot Wound Open Wounding Event: Gradually Appeared Status: Date Acquired: 05/11/2019 Comorbid Sleep Apnea, Deep Vein Thrombosis, Hypertension, Colitis, Type Weeks Of Treatment: 34 History: II Diabetes, Gout, Osteoarthritis, Neuropathy Clustered Wound: No Wound Measurements Length: (cm) 0.1 Width: (cm) 0.2 Depth: (cm)  0.1 Area: (cm) 0.016 Volume: (cm) 0.002 Wound Description Classification: Grade 2 Wound Margin: Distinct, outline attached Exudate Amount: Small Exudate Type: Serosanguineous Exudate Color: red, brown Foul Odor After Cleansing: No Slough/Fibrino No % Reduction in Area: 98.4% % Reduction in Volume: 99.5% Epithelialization: Large (67-100%) Tunneling: No Undermining: No Wound Bed Granulation Amount: Large (67-100%) Exposed Structure Granulation Quality: Red Fascia Exposed: No Necrotic Amount: None Present  (0%) Fat Layer (Subcutaneous Tissue) Exposed: Yes Tendon Exposed: No Muscle Exposed: No Joint Exposed: No Bone Exposed: No Treatment Notes Wound #1RR (Foot) Wound Laterality: Plantar, Left, Medial Cleanser Soap and Water Discharge Instruction: May shower and wash wound with dial antibacterial soap and water prior to dressing change. Peri-Wound Care Topical Primary Dressing KerraCel Ag Gelling Fiber Dressing, 2x2 in (silver alginate) Discharge Instruction: Apply silver alginate to wound bed as instructed Secondary Dressing Woven Gauze Sponges 2x2 in Discharge Instruction: Apply over primary dressing as directed. Optifoam Non-Adhesive Dressing, 4x4 in Discharge Instruction: Apply over primary dressing cut to make foam donut Secured With Conforming Stretch Gauze Bandage, Sterile 2x75 (in/in) Discharge Instruction: Secure with stretch gauze as directed. 36M Medipore H Soft Cloth Surgical T 4 x 2 (in/yd) ape Discharge Instruction: Secure dressing with tape as directed. Compression Wrap Compression Stockings Add-Ons Electronic Signature(s) Signed: 09/11/2020 5:41:58 PM By: Deon Pilling Entered By: Deon Pilling on 09/11/2020 13:09:54 -------------------------------------------------------------------------------- Wound Assessment Details Patient Name: Date of Service: [name] [name], [name] [name] E. 09/11/2020 12:45 PM Medical Record Number: [id number] Patient Account Number: [id number] Date of Birth/Sex: Treating RN: 1946-01-07 (75 y.o. Helene Shoe, Tammi Klippel Primary Care Ricquel Foulk: [name] Other Clinician: Referring Kriti Katayama: Treating Marlos Carmen/Extender: Landis Martins Weeks in Treatment: 34 Wound Status Wound Number: 6 Primary Etiology: Abrasion Wound Location: Left, Medial Foot Wound Status: Healed - Epithelialized Wounding Event: Trauma Date Acquired: 08/28/2020 Weeks Of Treatment: 2 Clustered Wound: No Wound Measurements Length: (cm) Width: (cm) Depth: (cm) Area:  (cm) Volume: (cm) 0 % Reduction in Area: 100% 0 % Reduction in Volume: 100% 0 0 0 Wound Description Classification: Full Thickness Without Exposed Support Structur es Treatment Notes Wound #6 (Foot) Wound Laterality: Left, Medial Cleanser Peri-Wound Care Topical Primary Dressing Secondary Dressing Secured With Compression Wrap Compression Stockings Add-Ons Electronic Signature(s) Signed: 09/11/2020 5:41:58 PM By: Deon Pilling Entered By: Deon Pilling on 09/11/2020 13:09:14 -------------------------------------------------------------------------------- Vitals Details Patient Name: Date of Service: [name] [name] E. 09/11/2020 12:45 PM Medical Record Number: [id number] Patient Account Number: [id number] Date of Birth/Sex: Treating RN: [birth year] (75 y.o. [name] Primary Care Vikkie Goeden: [name] Other Clinician: Referring Cote Mayabb: Treating Jatin Naumann/Extender: Adele Schilder in Treatment: 34 Vital Signs Time Taken: 13:00 Temperature (F): 99.2 Height (in): 66 Pulse (bpm): 85 Weight (lbs): 245 Respiratory Rate (breaths/min): 16 Body Mass Index (BMI): 39.5 Blood Pressure (mmHg): 150/74 Reference Range: 80 - 120 mg / dl Electronic Signature(s) Signed: 09/11/2020 5:41:58 PM By: Deon Pilling Entered By: Deon Pilling on 09/11/2020 58:85:02

## 2020-09-17 NOTE — Progress Notes (Signed)
MELDA, MERMELSTEIN ([id number]) Visit Report for 09/04/2020 Arrival Information Details Patient Name: Date of Service: [name] [name], [name] E. 09/04/2020 3:00 PM Medical Record Number: [id number] Patient Account Number: [id number] Date of Birth/Sex: Treating RN: Jul 03, 1946 (75 y.o. [name], [name] Primary Care Jesper Stirewalt: [name] Other Clinician: Referring Jabes Primo: Treating Cora Stetson/Extender: Adele Schilder in Treatment: 74 Visit Information History Since Last Visit Added or deleted any medications: No Patient Arrived: [name] [name] Any new allergies or adverse reactions: No Arrival Time: 16:03 Had a fall or experienced change in No Accompanied By: self activities of daily living that may affect Transfer Assistance: None risk of falls: Patient Identification Verified: Yes Signs or symptoms of abuse/neglect since last visito No Secondary Verification Process Completed: Yes Hospitalized since last visit: No Patient Requires Transmission-Based Precautions: No Implantable device outside of the clinic excluding No Patient Has Alerts: No cellular tissue based products placed in the center since last visit: Has Dressing in Place as Prescribed: Yes Pain Present Now: No Electronic Signature(s) Signed: 09/05/2020 3:17:01 PM By: Sandre Kitty Entered By: Sandre Kitty on 09/04/2020 16:04:01 -------------------------------------------------------------------------------- Encounter Discharge Information Details Patient Name: Date of Service: [name] Guarneri RBA RA E. 09/04/2020 3:00 PM Medical Record Number: [id number] Patient Account Number: [id number] Date of Birth/Sex: Treating RN: 25-Sep-1945 (75 y.o. [name], Lauren Primary Care Nichoel Digiulio: [name] Other Clinician: Referring De Jaworski: Treating Yamari Ventola/Extender: Adele Schilder in Treatment: 35 Encounter Discharge Information Items Post Procedure Vitals Discharge Condition: Stable Temperature (F):  98 Ambulatory Status: Ambulatory Pulse (bpm): 74 Discharge Destination: Home Respiratory Rate (breaths/min): 18 Transportation: Private Auto Blood Pressure (mmHg): 130/74 Accompanied By: self Schedule Follow-up Appointment: Yes Clinical Summary of Care: Patient Declined Electronic Signature(s) Signed: 09/17/2020 1:48:37 PM By: Rhae Hammock RN Entered By: Rhae Hammock on 09/04/2020 17:59:10 -------------------------------------------------------------------------------- Lower Extremity Assessment Details Patient Name: Date of Service: [name] Amato RA E. 09/04/2020 3:00 PM Medical Record Number: [id number] Patient Account Number: [id number] Date of Birth/Sex: Treating RN: 1946/05/05 (75 y.o. [name] Primary Care Payal Stanforth: [name] Other Clinician: Referring Harrington Jobe: Treating Monaye Blackie/Extender: Landis Martins Weeks in Treatment: 33 Edema Assessment Assessed: [Left: Yes] [Right: No] Edema: [Left: N] [Right: o] Calf Left: Right: Point of Measurement: 29 cm From Medial Instep 33 cm Ankle Left: Right: Point of Measurement: 12 cm From Medial Instep 22 cm Vascular Assessment Pulses: Dorsalis Pedis Palpable: [Left:Yes] Electronic Signature(s) Signed: 09/04/2020 6:11:14 PM By: Deon Pilling Entered By: Deon Pilling on 09/04/2020 16:16:06 -------------------------------------------------------------------------------- Multi-Disciplinary Care Plan Details Patient Name: Date of Service: [name] Amato RA E. 09/04/2020 3:00 PM Medical Record Number: [id number] Patient Account Number: [id number] Date of Birth/Sex: Treating RN: 02-15-1946 (75 y.o. [name] [name] Primary Care Cyle Kenyon: [name] Other Clinician: Referring Dolorez Jeffrey: Treating Presley Summerlin/Extender: Adele Schilder in Treatment: 56 Active Inactive Nutrition Nursing Diagnoses: Impaired glucose control: actual or potential Potential for alteratiion in  Nutrition/Potential for imbalanced nutrition Goals: Patient/caregiver verbalizes understanding of need to maintain therapeutic glucose control per primary care physician Date Initiated: 01/17/2020 Date Inactivated: 02/14/2020 Target Resolution Date: 02/14/2020 Goal Status: Met Patient/caregiver will maintain therapeutic glucose control Date Initiated: 01/17/2020 Target Resolution Date: 09/25/2020 Goal Status: Active Interventions: Assess HgA1c results as ordered upon admission and as needed Assess patient nutrition upon admission and as needed per policy Provide education on elevated blood sugars and impact on wound healing Treatment Activities: Patient referred to Primary Care Physician for further nutritional evaluation : 01/17/2020 Notes: Wound/Skin Impairment Nursing  Diagnoses: Impaired tissue integrity Knowledge deficit related to ulceration/compromised skin integrity Goals: Patient/caregiver will verbalize understanding of skin care regimen Date Initiated: 01/17/2020 Target Resolution Date: 09/25/2020 Goal Status: Active Ulcer/skin breakdown will have a volume reduction of 30% by week 4 Date Initiated: 01/17/2020 Date Inactivated: 02/14/2020 Target Resolution Date: 02/14/2020 Goal Status: Met Ulcer/skin breakdown will have a volume reduction of 50% by week 8 Date Initiated: 02/14/2020 Date Inactivated: 03/13/2020 Target Resolution Date: 03/13/2020 Goal Status: Met Ulcer/skin breakdown will have a volume reduction of 80% by week 12 Date Initiated: 03/13/2020 Date Inactivated: 04/10/2020 Target Resolution Date: 04/10/2020 Goal Status: Unmet Unmet Reason: infection Interventions: Assess patient/caregiver ability to obtain necessary supplies Assess patient/caregiver ability to perform ulcer/skin care regimen upon admission and as needed Assess ulceration(s) every visit Provide education on ulcer and skin care Treatment Activities: Skin care regimen initiated : 01/17/2020 Topical wound management  initiated : 01/17/2020 Notes: Electronic Signature(s) Signed: 09/04/2020 6:09:51 PM By: [name] RN, BSN Entered By: [name] on 09/04/2020 16:26:54 -------------------------------------------------------------------------------- Pain Assessment Details Patient Name: Date of Service: [name] Amato RA E. 09/04/2020 3:00 PM Medical Record Number: [id number] Patient Account Number: [id number] Date of Birth/Sex: Treating RN: 12/07/1945 (75 y.o. [name] [name] Primary Care Ledon Weihe: [name] Other Clinician: Referring Mccartney Chuba: Treating Lesleyann Fichter/Extender: Adele Schilder in Treatment: 49 Active Problems Location of Pain Severity and Description of Pain Patient Has Paino No Site Locations Pain Management and Medication Current Pain Management: Electronic Signature(s) Signed: 09/04/2020 6:09:51 PM By: [name] RN, BSN Signed: 09/05/2020 3:17:01 PM By: Sandre Kitty Entered By: Sandre Kitty on 09/04/2020 16:04:29 -------------------------------------------------------------------------------- Patient/Caregiver Education Details Patient Name: Date of Service: [name] Amato RA E. 1/26/2022andnbsp3:00 PM Medical Record Number: [id number] Patient Account Number: [id number] Date of Birth/Gender: Treating RN: 01-29-1946 (75 y.o. [name] [name] Primary Care Physician: [name] Other Clinician: Referring Physician: Treating Physician/Extender: Adele Schilder in Treatment: 58 Education Assessment Education Provided To: Patient Education Topics Provided Elevated Blood Sugar/ Impact on Healing: Methods: Explain/Verbal Responses: Reinforcements needed, State content correctly Offloading: Methods: Explain/Verbal Responses: Reinforcements needed, State content correctly Wound/Skin Impairment: Methods: Explain/Verbal Responses: Reinforcements needed, State content correctly Electronic Signature(s) Signed:  09/04/2020 6:09:51 PM By: [name] RN, BSN Entered By: [name] on 09/04/2020 16:29:08 -------------------------------------------------------------------------------- Wound Assessment Details Patient Name: Date of Service: [name] Amato RA E. 09/04/2020 3:00 PM Medical Record Number: [id number] Patient Account Number: [id number] Date of Birth/Sex: Treating RN: 24-Jul-1946 (75 y.o. [name] [name] Primary Care Breeonna Mone: [name] Other Clinician: Referring Porshia Blizzard: Treating Azure Barrales/Extender: Landis Martins Weeks in Treatment: 33 Wound Status Wound Number: 1RR Primary Diabetic Wound/Ulcer of the Lower Extremity Etiology: Wound Location: Left, Medial, Plantar Foot Wound Open Wounding Event: Gradually Appeared Status: Date Acquired: 05/11/2019 Comorbid Sleep Apnea, Deep Vein Thrombosis, Hypertension, Colitis, Type Weeks Of Treatment: 33 History: II Diabetes, Gout, Osteoarthritis, Neuropathy Clustered Wound: No Photos Photo Uploaded By: Mikeal Hawthorne on 09/10/2020 10:53:03 Wound Measurements Length: (cm) 0.3 Width: (cm) 0.2 Depth: (cm) 0.1 Area: (cm) 0.047 Volume: (cm) 0.005 % Reduction in Area: 95.4% % Reduction in Volume: 98.8% Epithelialization: Large (67-100%) Tunneling: No Undermining: No Wound Description Classification: Grade 2 Wound Margin: Distinct, outline attached Exudate Amount: Medium Exudate Type: Serosanguineous Exudate Color: red, brown Foul Odor After Cleansing: No Slough/Fibrino No Wound Bed Granulation Amount: Large (67-100%) Exposed Structure Granulation Quality: Red Fascia Exposed: No Necrotic Amount: None Present (0%) Fat Layer (Subcutaneous Tissue) Exposed: Yes Tendon Exposed: No Muscle  Exposed: No Joint Exposed: No Bone Exposed: No Electronic Signature(s) Signed: 09/04/2020 6:09:51 PM By: [name] RN, BSN Signed: 09/04/2020 6:11:14 PM By: Deon Pilling Entered By: Deon Pilling on 09/04/2020  16:17:45 -------------------------------------------------------------------------------- Wound Assessment Details Patient Name: Date of Service: [name] Guarneri RBA RA E. 09/04/2020 3:00 PM Medical Record Number: [id number] Patient Account Number: [id number] Date of Birth/Sex: Treating RN: August 01, 1946 (75 y.o. [name] [name] Primary Care Gurnie Duris: [name] Other Clinician: Referring Garrus Gauthreaux: Treating Jonella Redditt/Extender: Landis Martins Weeks in Treatment: 33 Wound Status Wound Number: 6 Primary Abrasion Etiology: Wound Location: Foot Wound Open Wounding Event: Trauma Status: Date Acquired: 08/28/2020 Comorbid Sleep Apnea, Deep Vein Thrombosis, Hypertension, Colitis, Type Weeks Of Treatment: 1 History: II Diabetes, Gout, Osteoarthritis, Neuropathy Clustered Wound: No Photos Photo Uploaded By: Mikeal Hawthorne on 09/10/2020 10:53:03 Wound Measurements Length: (cm) 0.2 Width: (cm) 0.2 Depth: (cm) 0.1 Area: (cm) 0.031 Volume: (cm) 0.003 % Reduction in Area: 0% % Reduction in Volume: 0% Epithelialization: Large (67-100%) Tunneling: No Undermining: No Wound Description Classification: Full Thickness Without Exposed Support Structures Wound Margin: Distinct, outline attached Exudate Amount: Small Exudate Type: Serosanguineous Exudate Color: red, brown Foul Odor After Cleansing: No Slough/Fibrino No Wound Bed Granulation Amount: None Present (0%) Exposed Structure Necrotic Amount: Large (67-100%) Fascia Exposed: No Necrotic Quality: Eschar Fat Layer (Subcutaneous Tissue) Exposed: No Tendon Exposed: No Muscle Exposed: No Joint Exposed: No Bone Exposed: No Assessment Notes scabbed over. Electronic Signature(s) Signed: 09/04/2020 6:09:51 PM By: [name] RN, BSN Signed: 09/04/2020 6:11:14 PM By: Deon Pilling Entered By: Deon Pilling on 09/04/2020 16:19:15 -------------------------------------------------------------------------------- Vitals  Details Patient Name: Date of Service: [name] [name], [name] RA E. 09/04/2020 3:00 PM Medical Record Number: [id number] Patient Account Number: [id number] Date of Birth/Sex: Treating RN: 07/08/1946 (75 y.o. [name] [name] Primary Care Augusta Hilbert: [name] Other Clinician: Referring Raequon Catanzaro: Treating Journee Bobrowski/Extender: Adele Schilder in Treatment: 33 Vital Signs Time Taken: 16:04 Temperature (F): 98.4 Height (in): 66 Pulse (bpm): 86 Weight (lbs): 245 Respiratory Rate (breaths/min): 18 Body Mass Index (BMI): 39.5 Blood Pressure (mmHg): 150/78 Reference Range: 80 - 120 mg / dl Electronic Signature(s) Signed: 09/05/2020 3:17:01 PM By: Sandre Kitty Entered By: Sandre Kitty on 09/04/2020 16:04:21

## 2020-09-18 ENCOUNTER — Other Ambulatory Visit: Payer: Self-pay

## 2020-09-18 ENCOUNTER — Encounter (HOSPITAL_BASED_OUTPATIENT_CLINIC_OR_DEPARTMENT_OTHER): Payer: Medicare Other | Admitting: Physician Assistant

## 2020-09-18 DIAGNOSIS — E11621 Type 2 diabetes mellitus with foot ulcer: Secondary | ICD-10-CM | POA: Diagnosis not present

## 2020-09-18 DIAGNOSIS — L97522 Non-pressure chronic ulcer of other part of left foot with fat layer exposed: Secondary | ICD-10-CM | POA: Diagnosis not present

## 2020-09-18 DIAGNOSIS — E1140 Type 2 diabetes mellitus with diabetic neuropathy, unspecified: Secondary | ICD-10-CM | POA: Diagnosis not present

## 2020-09-18 NOTE — Progress Notes (Addendum)
RANAE, CASEBIER ([id number]) Visit Report for 09/18/2020 Chief Complaint Document Details Patient Name: Date of Service: [name], [name] 09/18/2020 1:00 PM Medical Record Number: [id number] Patient Account Number: [id number] Date of Birth/Sex: Treating RN: Jan 03, 1946 (75 y.o. [name] [name] Primary Care Provider: [name] Other Clinician: Referring Provider: Treating Provider/Extender: Adele Schilder in Treatment: 35 Information Obtained from: Patient Chief Complaint Left foot ulcer Electronic Signature(s) Signed: 09/18/2020 1:30:49 PM By: Worthy Keeler PA-C Entered By: Worthy Keeler on 09/18/2020 13:30:49 -------------------------------------------------------------------------------- Debridement Details Patient Name: Date of Service: [name], [name] RBA RA E. 09/18/2020 1:00 PM Medical Record Number: [id number] Patient Account Number: [id number] Date of Birth/Sex: Treating RN: 11/10/1945 (75 y.o. [name] [name] Primary Care Provider: [name] Other Clinician: Referring Provider: Treating Provider/Extender: Adele Schilder in Treatment: 35 Debridement Performed for Assessment: Wound #1RR Left,Medial,Plantar Foot Performed By: Physician Worthy Keeler, PA Debridement Type: Debridement Severity of Tissue Pre Debridement: Fat layer exposed Level of Consciousness (Pre-procedure): Awake and Alert Pre-procedure Verification/Time Out Yes - 13:35 Taken: Start Time: 13:35 T Area Debrided (L x W): otal 0.5 (cm) x 0.3 (cm) = 0.15 (cm) Tissue and other material debrided: Viable, Non-Viable, Callus, Subcutaneous, Skin: Epidermis Level: Skin/Subcutaneous Tissue Debridement Description: Excisional Instrument: Curette Bleeding: Minimum Hemostasis Achieved: Pressure End Time: 13:39 Procedural Pain: 0 Post Procedural Pain: 0 Response to Treatment: Procedure was tolerated well Level of Consciousness (Post- Awake and  Alert procedure): Post Debridement Measurements of Total Wound Length: (cm) 0.5 Width: (cm) 0.3 Depth: (cm) 0.1 Volume: (cm) 0.012 Character of Wound/Ulcer Post Debridement: Improved Severity of Tissue Post Debridement: Fat layer exposed Post Procedure Diagnosis Same as Pre-procedure Electronic Signature(s) Signed: 09/18/2020 2:01:59 PM By: Worthy Keeler PA-C Signed: 09/18/2020 5:10:26 PM By: [name] RN, BSN Entered By: Worthy Keeler on 09/18/2020 14:01:59 -------------------------------------------------------------------------------- HPI Details Patient Name: Date of Service: [name] [name], [name] [place] RBA RA E. 09/18/2020 1:00 PM Medical Record Number: [id number] Patient Account Number: [id number] Date of Birth/Sex: Treating RN: 1945-08-24 (75 y.o. [name] [name] Primary Care Provider: [name] Other Clinician: Referring Provider: Treating Provider/Extender: Adele Schilder in Treatment: 35 History of Present Illness HPI Description: 01/17/2020 upon evaluation today patient presents for initial evaluation here in our clinic concerning issues she has been having with a left medial/plantar foot ulcer. This is actually been an issue for her since October 2020. She has been seeing Dr. Doran Durand for quite some time during that course. Fortunately there is no signs of active infection at this time. Or least no mention of this to have seen in general. With that being said unfortunately I do see some signs of erythema noted today that does have me concerned about the possibility of infection at this point in the surrounding area of the wound. There is also a warm to touch at the site which is somewhat concerning. Fortunately there is no evidence of systemic infection which is great news. The patient does have a history of diabetes mellitus type 2, Charcot foot which is what led to the wound, and hypertension. She notes that she was in a cast for some time with Dr. Doran Durand for  about 8 weeks. During that time they were utilizing according to the patient silver nitrate along with a foam doughnut and then Coban to secure in place in the cast in place. With that being said I do not have the actual records to review we are going to  try to get a hold of those unfortunately they would not flow over into care everywhere I did look today. She has been seeing Dr. Doran Durand and his physician assistant Larkin Ina up until the end of May and apparently is still seeing them on a regular basis every 2 weeks roughly. She has also tried Iodosorb without effect here. 01/24/2020 upon evaluation today patient actually appears to be doing quite well with regard to her wounds. She has been tolerating the dressing changes without complication. Fortunately there is no signs of active infection spreading which is good news. Her culture did show signs of Staph aureus I did place her on Augmentin due to the erythema surrounding the wound. With that being said the wound does appear to be doing better she has her longer walking cast/boot and I think that is actually good for her for the time being. I am considering reinitiating total contact cast when she gets back from vacation but next week she will actually be out of town at [employer] she knows not to get in the water but she still obviously is planning to enjoy herself she is going to take it easy on her foot however. 02/07/2020 upon evaluation today patient appears to be doing fairly well in regard to her ulcer on her foot. Fortunately there is no signs of severe infection at this time which is great news and overall very pleased in that regard. With that being said I do think that she could still benefit from a total contact cast. Nonetheless she is using her walking boot which at least provide some protection and that it prevents some of the friction occurring when she is ambulating. 02/14/2020 upon evaluation today patient appears to be doing well with  regard to her foot ulcer. This is actually measuring a little bit smaller yet again this week. Overall very pleased with where things stand and I do not see any signs of active infection at this time which is also good news. Since she is measuring better the patient has wanting to somewhat hold off on proceeding with the total contact cast which I think is reasonable at this point. 02/28/2020 on evaluation today patient appears to be doing well in general in regard to her wound although she has a lot of callus buildup as compared to last time I saw her. This is can require sharp debridement today. I do believe she really needs the total contact cast as well which we have discussed previous. 7/23; patient comes in for a total contact cast change 03/06/2020 on evaluation today patient appears to be doing quite well with regard to her wounds. Fortunately the wound bed is measuring smaller and looking much better there is little callus noted although there is some debridement necessary today. 03/13/2020 on evaluation today patient's wound actually appears to be doing excellent which is great news. With that being said unfortunately she is having some issues currently with her left leg where she does have cellulitis it appears. This may have come from an area that rubbed underneath the cast from last week that we noted we padded that area and it looks to be doing excellent at this point but nonetheless the leg was somewhat painful, swollen, and somewhat erythematous. She also had an elevated white blood cell count of 11.5 based on what I saw on looking at her records from the med center in [facility] from where she was seen yesterday. Unfortunately with [place] having a provider on vacation there  was no one here in the clinic in the afternoon when she called therefore she went to the ER as advised. Subsequently they did not cut off the cast as they did not have anyone from orthopedics there to do so and subsequently  also did not have the ability to do the Doppler for evaluation of DVT They recommended therefore given her dose of Eliquis as well as . Augmentin and sent her home to come see [place] today to have the cast taken off and then she is supposed to go back to have the study for DVT performed they are following. 03/20/2020 upon evaluation today patient appears to be doing well with regard to her foot all things considered we have not been able to use the total contact cast due to the infection that she had last week. She has been on the doxycycline and she had a 10-day supply of that I do believe that is helping and her leg appears to be doing better. With that being said there is fortunately no signs of active infection systemically at this time which is good news. No fevers, chills, nausea, vomiting, or diarrhea. 03/27/2020 upon evaluation today patient appears to be doing well with regard to her foot ulcer. There does not appear to be signs of active infection which is great news. Overall I am very pleased with where things stand at this point. 04/03/2020 upon evaluation today patient appears to be doing pretty well in regard to the overall appearance of her wound. Fortunately there is no signs of active infection at this time which is great news. No fevers, chills, nausea, vomiting, or diarrhea. With that being said she does have some blue-green drainage that actually is a little bit concerning to me for the possibility of Pseudomonas. I discussed that with the patient today. With that being said I do believe that we may be able to manage this however with the topical antibiotic cream as opposed to having to do anything oral especially since she seems to be doing so well with overall appearance of the wound. 04/10/2020 on evaluation today patient appears to be doing about the same roughly in regard to the overall size of her wound. With that being said she fortunately has not shown any signs of worsening  overall which is good news. I do believe that she is doing a great job trying to offload but again she may still do better with the cast. I do not see in the blue-green drainage that we noticed previously I do believe the gentamicin help in this regard. 04/17/2020 on evaluation today patient's wound appears to be doing about the same at this point. There is no significant improvement at this point. No fever chills noted. She is up for put the cast back on the day. That she states in a couple weeks she will need to have this off to go to a workshop. 04/24/2020 on evaluation today patient appears to be doing significantly better in regard to her wound. Fortunately there is no signs of active infection and overall feel like she is making great progress the cast seems to have done excellent for her. 05/01/2020 upon evaluation today patient presents for reevaluation she really does not appear to be doing too badly in regard to the actual wound on the left foot we have been managing. Unfortunately she has bilateral lower extremity edema with blisters between the webspace of her first and second toe on both feet. She has a tremendous amount of  edema in the legs which I think is where this is coming from it does not appear to be infected but nonetheless I do believe this is can be something that needs to be addressed today. Obviously this means we probably will not be putting the cast on at this point. She attributes this to the fact that she was sitting with her feet on the floor much longer during a conference last week she had a great time but unfortunately had a lot of complications as a result. 05/08/2020 upon evaluation today patient appears to be doing somewhat better in regard to her wounds at this time. Fortunately there is no signs of active infection which is great news. With that being said I do believe that the blisters have ruptured and unfortunately did not just reattach I will remove some of  the blistered tissue today. With that being said I do think the wound itself on the plantar aspect of left foot does need to have sharp debridement. 05/15/2020 upon evaluation today patient appears to be doing about the same in regard to her foot ulcer. Unfortunately in the past week her husband had a fall where he sustained a mild traumatic brain bleed. Fortunately he is doing better but being that he was in the hospital she had a walk on this a lot more. The wound does not appear to be any better is also not really appearing to be significantly worse which is good news. There is no signs of active infection at this time. 10/14; patient with a small diabetic wound on the medial part of her left foot. We have been using silver collagen a total contact cast making good progress. I think the patient had a series of blisters on her dorsal foot probably secondary to having her legs recumbent for 3 days while in a conference in [place]. We wrapped her leg last week these are all healed. We did not previously have her in compression on the right leg. 05/29/2020 upon evaluation today patient appears to be doing well with regard to the wound on the plantar aspect of her foot medially. This is measuring smaller and looking much better than last time I saw her. Again when I did see her last was 2 weeks back and the wound was significantly larger. I do believe the cast is helping and I believe the collagen is a good option for her. 06/05/2020 on evaluation today patient appears to be doing well with regard to her foot ulcer this is actually measuring significantly better and overall I feel like she is doing excellent. There is no signs of active infection at this time. 06/12/2020 upon evaluation today patient actually continues to show signs of good improvement which is excellent news. I am extremely pleased with how she seems to be progressing at this point in regard to her wound. There is still some depth to the  wound but I do believe the collagen is helping her quite a bit. 06/19/2020 upon evaluation today patient appears to be doing well with regard to her plantar foot ulcer. She is actually making excellent progress and in fact this appears to be almost completely healed. With that being said I do believe that the patient is going to actually require 1 more week in the cast although after that I am hopeful she will be ready for discharge. 06/26/2020 on evaluation today patient appears to be doing well in regard to her wound currently. Fortunately there is no signs of active infection  in general I feel like she is doing excellent. This appears to be completely healed I think she is ready to come out of the cast. 11/29; patient comes back in the clinic today with a very quick reopening in the exact same area on the medial plantar foot. She had been healed out last time. She went back into some new balance shoes that she got at [employer] with a custom insert. As it turns out this wound also happened when wearing these shoes although there was some modification made I think with the wound initially happened they added some foam around the wound area. This obviously is not going to be sufficient. 07/17/2020 on evaluation today patient appears to be doing well with regard to her wound. Fortunately she seems to be making good progress. Unfortunately she was in the hospital due to a issue with colitis and had just been discharged today in fact. She tells me that her biggest concern here is that a lot of her numbers especially her creatinine were somewhat elevated and problematic. She is can be following up with her provider in order to have a further work-up at this time. With that being said she did need to come back in for a cast change. She did not allow them to remove the cast due to the fact that she did not feel like that was the issue whatsoever and indeed it does not appear that was the case her wound appears  to be doing excellent today. 07/24/2020 upon evaluation today patient appears to be doing well in regard to her foot ulcer. She still has a small opening but this is showing signs of excellent improvement overall but they were very close to complete resolution. No fevers, chills, nausea, vomiting, or diarrhea. 07/31/2020 upon evaluation today patient actually appears to be doing excellent she is actually completely healed this is great news. Fortunately there is no signs of active infection at this time. No fevers, chills, nausea, vomiting, or diarrhea. The patient tells me that she did see Dr. Paulla Dolly in order to get to [place] so that she can have a custom shoe made. She saw him earlier last week. She does have an appointment with Liliane Channel on the sixth I believe on January she tells me Readmission: 08/28/2020 on evaluation today patient appears to be doing poorly in regard to her wound currently. She tells me this has reopened. The good news that she did get measured for and actually her shoes are on the way in from [facility].. With that being said she has been trying to stay off of this is much as possible using her wheelchair around home. Nonetheless has been somewhat difficult. The good news is her shoes should be here next week 09/04/2020 upon evaluation today patient appears to be doing well with regard to her wound. There is a little bit of callus buildup but nothing too significant she does tell me she was very active this week. Fortunately there is no signs of systemic infection at this point. The dorsal foot wound actually appears to be doing better. I think this is healed. 09/11/2020 upon evaluation today patient appears to actually be doing quite well in my opinion based on what I am seeing today. Fortunately there is no signs of infection in fact her mother is certain that this is even open any longer based on what I see. Fortunately I think the patient has been doing everything she can to try  to keep this under  control. 09/18/2020 upon evaluation today patient appears to be doing about the same in regard to her foot ulcer. She has been tolerating the dressing changes without complication. Fortunately there is no sign of active infection at this time. No fevers, chills, nausea, vomiting, or diarrhea. She is going require some sharp debridement today. Electronic Signature(s) Signed: 09/18/2020 1:59:43 PM By: Worthy Keeler PA-C Entered By: Worthy Keeler on 09/18/2020 13:59:43 -------------------------------------------------------------------------------- Physical Exam Details Patient Name: Date of Service: [name] [name], [name] RA E. 09/18/2020 1:00 PM Medical Record Number: [id number] Patient Account Number: [id number] Date of Birth/Sex: Treating RN: [birth year] (75 y.o. [name] [name] Primary Care Provider: [name] Other Clinician: Referring Provider: Treating Provider/Extender: Landis Martins Weeks in Treatment: 1 Constitutional Well-nourished and well-hydrated in no acute distress. Respiratory normal breathing without difficulty. Psychiatric this patient is able to make decisions and demonstrates good insight into disease process. Alert and Oriented x 3. pleasant and cooperative. Notes Patient's wound bed actually showed signs again of callus around the edges of the wound the wound is still fairly superficial which is good news and overall do not see any signs of infection also great news. In general I am extremely pleased with how things stand today. Electronic Signature(s) Signed: 09/18/2020 1:59:59 PM By: Worthy Keeler PA-C Entered By: Worthy Keeler on 09/18/2020 13:59:59 -------------------------------------------------------------------------------- Physician Orders Details Patient Name: Date of Service: [name] [name], [name] RA E. 09/18/2020 1:00 PM Medical Record Number: [id number] Patient Account Number: [id number] Date of Birth/Sex: Treating RN: 11-15-1945  (75 y.o. [name] [name] Primary Care Provider: [name] Other Clinician: Referring Provider: Treating Provider/Extender: Adele Schilder in Treatment: 43 Verbal / Phone Orders: No Diagnosis Coding ICD-10 Coding Code Description E11.621 Type 2 diabetes mellitus with foot ulcer L97.522 Non-pressure chronic ulcer of other part of left foot with fat layer exposed M14.672 Charcot's joint, left ankle and foot E11.40 Type 2 diabetes mellitus with diabetic neuropathy, unspecified Follow-up Appointments Return Appointment in 1 week. Bathing/ Shower/ Hygiene May shower and wash wound with soap and water. - with dressing changes Off-Loading Open toe surgical shoe to: - left foot Wound Treatment Wound #1RR - Foot Wound Laterality: Plantar, Left, Medial Cleanser: Soap and Water Every Other Day/7 Days Discharge Instructions: May shower and wash wound with dial antibacterial soap and water prior to dressing change. Prim Dressing: KerraCel Ag Gelling Fiber Dressing, 2x2 in (silver alginate) Every Other Day/7 Days ary Discharge Instructions: Apply silver alginate to wound bed as instructed Secondary Dressing: Woven Gauze Sponges 2x2 in Every Other Day/7 Days Discharge Instructions: Apply over primary dressing as directed. Secondary Dressing: Optifoam Non-Adhesive Dressing, 4x4 in Every Other Day/7 Days Discharge Instructions: Apply over primary dressing cut to make foam donut Secured With: Conforming Stretch Gauze Bandage, Sterile 2x75 (in/in) Every Other Day/7 Days Discharge Instructions: Secure with stretch gauze as directed. Secured With: 64M Medipore H Soft Cloth Surgical T 4 x 2 (in/yd) Every Other Day/7 Days ape Discharge Instructions: Secure dressing with tape as directed. Electronic Signature(s) Signed: 09/18/2020 5:10:26 PM By: [name] RN, BSN Signed: 09/18/2020 6:51:30 PM By: Worthy Keeler PA-C Entered By: [name] on 09/18/2020  13:39:52 -------------------------------------------------------------------------------- Problem List Details Patient Name: Date of Service: [name] Guarneri RBA RA E. 09/18/2020 1:00 PM Medical Record Number: [id number] Patient Account Number: [id number] Date of Birth/Sex: Treating RN: 08-22-1945 (75 y.o. [name] [name] Primary Care Provider: [name] Other Clinician: Referring Provider: Treating Provider/Extender: Earlean Shawl,  Stacy Weeks in Treatment: 35 Active Problems ICD-10 Encounter Code Description Active Date MDM Diagnosis E11.621 Type 2 diabetes mellitus with foot ulcer 01/17/2020 No Yes L97.522 Non-pressure chronic ulcer of other part of left foot with fat layer exposed 01/17/2020 No Yes M14.672 Charcot's joint, left ankle and foot 01/17/2020 No Yes E11.40 Type 2 diabetes mellitus with diabetic neuropathy, unspecified 01/17/2020 No Yes Inactive Problems ICD-10 Code Description Active Date Inactive Date I10 Essential (primary) hypertension 01/17/2020 01/17/2020 Resolved Problems Electronic Signature(s) Signed: 09/18/2020 1:30:43 PM By: Worthy Keeler PA-C Signed: 09/18/2020 1:30:43 PM By: Worthy Keeler PA-C Entered By: Worthy Keeler on 09/18/2020 13:30:42 -------------------------------------------------------------------------------- Progress Note Details Patient Name: Date of Service: [name] [name], [name] RA E. 09/18/2020 1:00 PM Medical Record Number: [id number] Patient Account Number: [id number] Date of Birth/Sex: Treating RN: 1946-08-10 (75 y.o. [name] [name] Primary Care Provider: [name] Other Clinician: Referring Provider: Treating Provider/Extender: Adele Schilder in Treatment: 38 Subjective Chief Complaint Information obtained from Patient Left foot ulcer History of Present Illness (HPI) 01/17/2020 upon evaluation today patient presents for initial evaluation here in our clinic concerning issues she has been having with a left  medial/plantar foot ulcer. This is actually been an issue for her since October 2020. She has been seeing Dr. Doran Durand for quite some time during that course. Fortunately there is no signs of active infection at this time. Or least no mention of this to have seen in general. With that being said unfortunately I do see some signs of erythema noted today that does have me concerned about the possibility of infection at this point in the surrounding area of the wound. There is also a warm to touch at the site which is somewhat concerning. Fortunately there is no evidence of systemic infection which is great news. The patient does have a history of diabetes mellitus type 2, Charcot foot which is what led to the wound, and hypertension. She notes that she was in a cast for some time with Dr. Doran Durand for about 8 weeks. During that time they were utilizing according to the patient silver nitrate along with a foam doughnut and then Coban to secure in place in the cast in place. With that being said I do not have the actual records to review we are going to try to get a hold of those unfortunately they would not flow over into care everywhere I did look today. She has been seeing Dr. Doran Durand and his physician assistant Larkin Ina up until the end of May and apparently is still seeing them on a regular basis every 2 weeks roughly. She has also tried Iodosorb without effect here. 01/24/2020 upon evaluation today patient actually appears to be doing quite well with regard to her wounds. She has been tolerating the dressing changes without complication. Fortunately there is no signs of active infection spreading which is good news. Her culture did show signs of Staph aureus I did place her on Augmentin due to the erythema surrounding the wound. With that being said the wound does appear to be doing better she has her longer walking cast/boot and I think that is actually good for her for the time being. I am considering  reinitiating total contact cast when she gets back from vacation but next week she will actually be out of town at [employer] she knows not to get in the water but she still obviously is planning to enjoy herself she is going to take it easy  on her foot however. 02/07/2020 upon evaluation today patient appears to be doing fairly well in regard to her ulcer on her foot. Fortunately there is no signs of severe infection at this time which is great news and overall very pleased in that regard. With that being said I do think that she could still benefit from a total contact cast. Nonetheless she is using her walking boot which at least provide some protection and that it prevents some of the friction occurring when she is ambulating. 02/14/2020 upon evaluation today patient appears to be doing well with regard to her foot ulcer. This is actually measuring a little bit smaller yet again this week. Overall very pleased with where things stand and I do not see any signs of active infection at this time which is also good news. Since she is measuring better the patient has wanting to somewhat hold off on proceeding with the total contact cast which I think is reasonable at this point. 02/28/2020 on evaluation today patient appears to be doing well in general in regard to her wound although she has a lot of callus buildup as compared to last time I saw her. This is can require sharp debridement today. I do believe she really needs the total contact cast as well which we have discussed previous. 7/23; patient comes in for a total contact cast change 03/06/2020 on evaluation today patient appears to be doing quite well with regard to her wounds. Fortunately the wound bed is measuring smaller and looking much better there is little callus noted although there is some debridement necessary today. 03/13/2020 on evaluation today patient's wound actually appears to be doing excellent which is great news. With that being said  unfortunately she is having some issues currently with her left leg where she does have cellulitis it appears. This may have come from an area that rubbed underneath the cast from last week that we noted we padded that area and it looks to be doing excellent at this point but nonetheless the leg was somewhat painful, swollen, and somewhat erythematous. She also had an elevated white blood cell count of 11.5 based on what I saw on looking at her records from the med center in [facility] from where she was seen yesterday. Unfortunately with [place] having a provider on vacation there was no one here in the clinic in the afternoon when she called therefore she went to the ER as advised. Subsequently they did not cut off the cast as they did not have anyone from orthopedics there to do so and subsequently also did not have the ability to do the Doppler for evaluation of DVT They recommended therefore given her dose of Eliquis as well as . Augmentin and sent her home to come see [place] today to have the cast taken off and then she is supposed to go back to have the study for DVT performed they are following. 03/20/2020 upon evaluation today patient appears to be doing well with regard to her foot all things considered we have not been able to use the total contact cast due to the infection that she had last week. She has been on the doxycycline and she had a 10-day supply of that I do believe that is helping and her leg appears to be doing better. With that being said there is fortunately no signs of active infection systemically at this time which is good news. No fevers, chills, nausea, vomiting, or diarrhea. 03/27/2020 upon evaluation today  patient appears to be doing well with regard to her foot ulcer. There does not appear to be signs of active infection which is great news. Overall I am very pleased with where things stand at this point. 04/03/2020 upon evaluation today patient appears to be doing pretty well  in regard to the overall appearance of her wound. Fortunately there is no signs of active infection at this time which is great news. No fevers, chills, nausea, vomiting, or diarrhea. With that being said she does have some blue-green drainage that actually is a little bit concerning to me for the possibility of Pseudomonas. I discussed that with the patient today. With that being said I do believe that we may be able to manage this however with the topical antibiotic cream as opposed to having to do anything oral especially since she seems to be doing so well with overall appearance of the wound. 04/10/2020 on evaluation today patient appears to be doing about the same roughly in regard to the overall size of her wound. With that being said she fortunately has not shown any signs of worsening overall which is good news. I do believe that she is doing a great job trying to offload but again she may still do better with the cast. I do not see in the blue-green drainage that we noticed previously I do believe the gentamicin help in this regard. 04/17/2020 on evaluation today patient's wound appears to be doing about the same at this point. There is no significant improvement at this point. No fever chills noted. She is up for put the cast back on the day. That she states in a couple weeks she will need to have this off to go to a workshop. 04/24/2020 on evaluation today patient appears to be doing significantly better in regard to her wound. Fortunately there is no signs of active infection and overall feel like she is making great progress the cast seems to have done excellent for her. 05/01/2020 upon evaluation today patient presents for reevaluation she really does not appear to be doing too badly in regard to the actual wound on the left foot we have been managing. Unfortunately she has bilateral lower extremity edema with blisters between the webspace of her first and second toe on both feet. She has a  tremendous amount of edema in the legs which I think is where this is coming from it does not appear to be infected but nonetheless I do believe this is can be something that needs to be addressed today. Obviously this means we probably will not be putting the cast on at this point. She attributes this to the fact that she was sitting with her feet on the floor much longer during a conference last week she had a great time but unfortunately had a lot of complications as a result. 05/08/2020 upon evaluation today patient appears to be doing somewhat better in regard to her wounds at this time. Fortunately there is no signs of active infection which is great news. With that being said I do believe that the blisters have ruptured and unfortunately did not just reattach I will remove some of the blistered tissue today. With that being said I do think the wound itself on the plantar aspect of left foot does need to have sharp debridement. 05/15/2020 upon evaluation today patient appears to be doing about the same in regard to her foot ulcer. Unfortunately in the past week her husband had a fall where he  sustained a mild traumatic brain bleed. Fortunately he is doing better but being that he was in the hospital she had a walk on this a lot more. The wound does not appear to be any better is also not really appearing to be significantly worse which is good news. There is no signs of active infection at this time. 10/14; patient with a small diabetic wound on the medial part of her left foot. We have been using silver collagen a total contact cast making good progress. I think the patient had a series of blisters on her dorsal foot probably secondary to having her legs recumbent for 3 days while in a conference in [place]. We wrapped her leg last week these are all healed. We did not previously have her in compression on the right leg. 05/29/2020 upon evaluation today patient appears to be doing well with regard  to the wound on the plantar aspect of her foot medially. This is measuring smaller and looking much better than last time I saw her. Again when I did see her last was 2 weeks back and the wound was significantly larger. I do believe the cast is helping and I believe the collagen is a good option for her. 06/05/2020 on evaluation today patient appears to be doing well with regard to her foot ulcer this is actually measuring significantly better and overall I feel like she is doing excellent. There is no signs of active infection at this time. 06/12/2020 upon evaluation today patient actually continues to show signs of good improvement which is excellent news. I am extremely pleased with how she seems to be progressing at this point in regard to her wound. There is still some depth to the wound but I do believe the collagen is helping her quite a bit. 06/19/2020 upon evaluation today patient appears to be doing well with regard to her plantar foot ulcer. She is actually making excellent progress and in fact this appears to be almost completely healed. With that being said I do believe that the patient is going to actually require 1 more week in the cast although after that I am hopeful she will be ready for discharge. 06/26/2020 on evaluation today patient appears to be doing well in regard to her wound currently. Fortunately there is no signs of active infection in general I feel like she is doing excellent. This appears to be completely healed I think she is ready to come out of the cast. 11/29; patient comes back in the clinic today with a very quick reopening in the exact same area on the medial plantar foot. She had been healed out last time. She went back into some new balance shoes that she got at [employer] with a custom insert. As it turns out this wound also happened when wearing these shoes although there was some modification made I think with the wound initially happened they added some foam  around the wound area. This obviously is not going to be sufficient. 07/17/2020 on evaluation today patient appears to be doing well with regard to her wound. Fortunately she seems to be making good progress. Unfortunately she was in the hospital due to a issue with colitis and had just been discharged today in fact. She tells me that her biggest concern here is that a lot of her numbers especially her creatinine were somewhat elevated and problematic. She is can be following up with her provider in order to have a further work-up at this time. With  that being said she did need to come back in for a cast change. She did not allow them to remove the cast due to the fact that she did not feel like that was the issue whatsoever and indeed it does not appear that was the case her wound appears to be doing excellent today. 07/24/2020 upon evaluation today patient appears to be doing well in regard to her foot ulcer. She still has a small opening but this is showing signs of excellent improvement overall but they were very close to complete resolution. No fevers, chills, nausea, vomiting, or diarrhea. 07/31/2020 upon evaluation today patient actually appears to be doing excellent she is actually completely healed this is great news. Fortunately there is no signs of active infection at this time. No fevers, chills, nausea, vomiting, or diarrhea. The patient tells me that she did see Dr. Paulla Dolly in order to get to [place] so that she can have a custom shoe made. She saw him earlier last week. She does have an appointment with Liliane Channel on the sixth I believe on January she tells me Readmission: 08/28/2020 on evaluation today patient appears to be doing poorly in regard to her wound currently. She tells me this has reopened. The good news that she did get measured for and actually her shoes are on the way in from [facility].. With that being said she has been trying to stay off of this is much as possible using her  wheelchair around home. Nonetheless has been somewhat difficult. The good news is her shoes should be here next week 09/04/2020 upon evaluation today patient appears to be doing well with regard to her wound. There is a little bit of callus buildup but nothing too significant she does tell me she was very active this week. Fortunately there is no signs of systemic infection at this point. The dorsal foot wound actually appears to be doing better. I think this is healed. 09/11/2020 upon evaluation today patient appears to actually be doing quite well in my opinion based on what I am seeing today. Fortunately there is no signs of infection in fact her mother is certain that this is even open any longer based on what I see. Fortunately I think the patient has been doing everything she can to try to keep this under control. 09/18/2020 upon evaluation today patient appears to be doing about the same in regard to her foot ulcer. She has been tolerating the dressing changes without complication. Fortunately there is no sign of active infection at this time. No fevers, chills, nausea, vomiting, or diarrhea. She is going require some sharp debridement today. Objective Constitutional Well-nourished and well-hydrated in no acute distress. Vitals Time Taken: 12:58 PM, Height: 66 in, Weight: 245 lbs, BMI: 39.5, Temperature: 98.2 F, Pulse: 84 bpm, Respiratory Rate: 18 breaths/min, Blood Pressure: 145/77 mmHg. Respiratory normal breathing without difficulty. Psychiatric this patient is able to make decisions and demonstrates good insight into disease process. Alert and Oriented x 3. pleasant and cooperative. General Notes: Patient's wound bed actually showed signs again of callus around the edges of the wound the wound is still fairly superficial which is good news and overall do not see any signs of infection also great news. In general I am extremely pleased with how things stand today. Integumentary (Hair,  Skin) Wound #1RR status is Open. Original cause of wound was Gradually Appeared. The wound is located on the [place]. The wound measures 0.2cm length x 0.2cm width x  0.1cm depth; 0.031cm^2 area and 0.003cm^3 volume. There is Fat Layer (Subcutaneous Tissue) exposed. There is no tunneling or undermining noted. There is a small amount of sanguinous drainage noted. The wound margin is distinct with the outline attached to the wound base. There is large (67-100%) pink granulation within the wound bed. There is no necrotic tissue within the wound bed. Assessment Active Problems ICD-10 Type 2 diabetes mellitus with foot ulcer Non-pressure chronic ulcer of other part of left foot with fat layer exposed Charcot's joint, left ankle and foot Type 2 diabetes mellitus with diabetic neuropathy, unspecified Procedures Wound #1RR Pre-procedure diagnosis of Wound #1RR is a Diabetic Wound/Ulcer of the Lower Extremity located on the Left,Medial,Plantar Foot .Severity of Tissue Pre Debridement is: Fat layer exposed. There was a Excisional Skin/Subcutaneous Tissue Debridement with a total area of 0.15 sq cm performed by Worthy Keeler, PA. With the following instrument(s): Curette to remove Viable and Non-Viable tissue/material. Material removed includes Callus, Subcutaneous Tissue, and Skin: Epidermis. No specimens were taken. A time out was conducted at 13:35, prior to the start of the procedure. A Minimum amount of bleeding was controlled with Pressure. The procedure was tolerated well with a pain level of 0 throughout and a pain level of 0 following the procedure. Post Debridement Measurements: 0.5cm length x 0.3cm width x 0.1cm depth; 0.012cm^3 volume. Character of Wound/Ulcer Post Debridement is improved. Severity of Tissue Post Debridement is: Fat layer exposed. Post procedure Diagnosis Wound #1RR: Same as Pre-Procedure Plan Follow-up Appointments: Return Appointment in 1 week. Bathing/  Shower/ Hygiene: May shower and wash wound with soap and water. - with dressing changes Off-Loading: Open toe surgical shoe to: - left foot WOUND #1RR: - Foot Wound Laterality: Plantar, Left, Medial Cleanser: Soap and Water Every Other Day/7 Days Discharge Instructions: May shower and wash wound with dial antibacterial soap and water prior to dressing change. Prim Dressing: KerraCel Ag Gelling Fiber Dressing, 2x2 in (silver alginate) Every Other Day/7 Days ary Discharge Instructions: Apply silver alginate to wound bed as instructed Secondary Dressing: Woven Gauze Sponges 2x2 in Every Other Day/7 Days Discharge Instructions: Apply over primary dressing as directed. Secondary Dressing: Optifoam Non-Adhesive Dressing, 4x4 in Every Other Day/7 Days Discharge Instructions: Apply over primary dressing cut to make foam donut Secured With: Conforming Stretch Gauze Bandage, Sterile 2x75 (in/in) Every Other Day/7 Days Discharge Instructions: Secure with stretch gauze as directed. Secured With: 21M Medipore H Soft Cloth Surgical T 4 x 2 (in/yd) Every Other Day/7 Days ape Discharge Instructions: Secure dressing with tape as directed. 1. Would recommend currently that we going continue with the wound care measures as before and the patient is in agreement with that plan this includes the use of the silver alginate dressing to try to help keep the area clean and dry. 2. Also can recommend the patient continue to offload is much as she can obviously I do not want her to be completely unable to do anything but I do want her to try as much as possible to keep pressure off of the foot to allow this to heal. We will see patient back for reevaluation in 1 week here in the clinic. If anything worsens or changes patient will contact our office for additional recommendations. Electronic Signature(s) Signed: 09/18/2020 2:02:46 PM By: Worthy Keeler PA-C Previous Signature: 09/18/2020 2:00:38 PM Version By: Worthy Keeler PA-C Entered By: Worthy Keeler on 09/18/2020 14:02:46 -------------------------------------------------------------------------------- SuperBill Details Patient Name: Date of Service: [name] [name], [name]  RA E. 09/18/2020 Medical Record Number: [id number] Patient Account Number: [id number] Date of Birth/Sex: Treating RN: 11/23/1945 (75 y.o. [name] [name] Primary Care Provider: [name] Other Clinician: Referring Provider: Treating Provider/Extender: Adele Schilder in Treatment: 35 Diagnosis Coding ICD-10 Codes Code Description E11.621 Type 2 diabetes mellitus with foot ulcer L97.522 Non-pressure chronic ulcer of other part of left foot with fat layer exposed M14.672 Charcot's joint, left ankle and foot E11.40 Type 2 diabetes mellitus with diabetic neuropathy, unspecified Facility Procedures CPT4 Code: [id number] Description: 68159 - DEB SUBQ TISSUE 20 SQ CM/< ICD-10 Diagnosis Description L97.522 Non-pressure chronic ulcer of other part of left foot with fat layer exposed Modifier: Quantity: 1 Physician Procedures : CPT4 Code Description Modifier [id number] - WC PHYS SUBQ TISS 20 SQ CM ICD-10 Diagnosis Description L97.522 Non-pressure chronic ulcer of other part of left foot with fat layer exposed Quantity: 1 Electronic Signature(s) Signed: 09/18/2020 2:03:05 PM By: Worthy Keeler PA-C Entered By: Worthy Keeler on 09/18/2020 14:03:05

## 2020-09-18 NOTE — Progress Notes (Signed)
LANYLA, COSTELLO ([id number]) Visit Report for 09/18/2020 Arrival Information Details Patient Name: Date of Service: ARMANIE, ULLMER RA E. 09/18/2020 1:00 PM Medical Record Number: [id number] Patient Account Number: [id number] Date of Birth/Sex: Treating RN: 01-03-1946 (75 y.o. [name] Primary Care Keva Darty: [name] Other Clinician: Referring Torrey Horseman: Treating Chico Cawood/Extender: Adele Schilder in Treatment: 59 Visit Information History Since Last Visit Added or deleted any medications: No Patient Arrived: [name] Any new allergies or adverse reactions: No Arrival Time: 12:58 Had a fall or experienced change in No Accompanied By: alone activities of daily living that may affect Transfer Assistance: None risk of falls: Patient Identification Verified: Yes Signs or symptoms of abuse/neglect since No Secondary Verification Process Completed: Yes last visito Patient Requires Transmission-Based Precautions: No Hospitalized since last visit: No Patient Has Alerts: No Implantable device outside of the clinic No excluding cellular tissue based products placed in the center since last visit: Has Dressing in Place as Prescribed: Yes Has Footwear/Offloading in Place as Yes Prescribed: Left: Surgical Shoe with Pressure Relief Insole Pain Present Now: No Electronic Signature(s) Signed: 09/18/2020 5:02:12 PM By: Levan Hurst RN, BSN Entered By: Levan Hurst on 09/18/2020 13:06:04 -------------------------------------------------------------------------------- Encounter Discharge Information Details Patient Name: Date of Service: [place], [name] 09/18/2020 1:00 PM Medical Record Number: [id number] Patient Account Number: [id number] Date of Birth/Sex: Treating RN: [birth year] (75 y.o. [name], Lauren Primary Care Reynoldo Mainer: [name] Other Clinician: Referring Amire Gossen: Treating Gerlene Glassburn/Extender: Adele Schilder in Treatment:  64 Encounter Discharge Information Items Post Procedure Vitals Discharge Condition: Stable Temperature (F): 98.2 Ambulatory Status: Ambulatory Pulse (bpm): 84 Discharge Destination: Home Respiratory Rate (breaths/min): 17 Transportation: Private Auto Blood Pressure (mmHg): 145/77 Accompanied By: self Schedule Follow-up Appointment: Yes Clinical Summary of Care: Patient Declined Electronic Signature(s) Signed: 09/18/2020 5:02:07 PM By: Rhae Hammock RN Entered By: Rhae Hammock on 09/18/2020 13:54:36 -------------------------------------------------------------------------------- Lower Extremity Assessment Details Patient Name: Date of Service: Seide, [name] 09/18/2020 1:00 PM Medical Record Number: [id number] Patient Account Number: [id number] Date of Birth/Sex: Treating RN: 06-19-1946 (75 y.o. [name] Primary Care Netanel Yannuzzi: [name] Other Clinician: Referring Gurtaj Ruz: Treating Aprel Egelhoff/Extender: Landis Martins Weeks in Treatment: 35 Edema Assessment Assessed: [Left: No] [Right: No] Edema: [Left: Ye] [Right: s] Calf Left: Right: Point of Measurement: 29 cm From Medial Instep 38 cm Ankle Left: Right: Point of Measurement: 12 cm From Medial Instep 23.4 cm Vascular Assessment Pulses: Dorsalis Pedis Palpable: [Left:Yes] Electronic Signature(s) Signed: 09/18/2020 5:02:12 PM By: Levan Hurst RN, BSN Entered By: Levan Hurst on 09/18/2020 13:06:48 -------------------------------------------------------------------------------- [place] Details Patient Name: Date of Service: [name] 09/18/2020 1:00 PM Medical Record Number: [id number] Patient Account Number: [id number] Date of Birth/Sex: Treating RN: 1946/06/11 (75 y.o. [name] Primary Care Haeden Hudock: [name] Other Clinician: Referring Sharlisa Hollifield: Treating Swati Granberry/Extender: Adele Schilder in Treatment: 52 Active  Inactive Nutrition Nursing Diagnoses: Impaired glucose control: actual or potential Potential for alteratiion in Nutrition/Potential for imbalanced nutrition Goals: Patient/caregiver verbalizes understanding of need to maintain therapeutic glucose control per primary care physician Date Initiated: 01/17/2020 Date Inactivated: 02/14/2020 Target Resolution Date: 02/14/2020 Goal Status: Met Patient/caregiver will maintain therapeutic glucose control Date Initiated: 01/17/2020 Target Resolution Date: 09/25/2020 Goal Status: Active Interventions: Assess HgA1c results as ordered upon admission and as needed Assess patient nutrition upon admission and as needed per policy Provide education on elevated blood sugars and impact on wound healing  Treatment Activities: Patient referred to Primary Care Physician for further nutritional evaluation : 01/17/2020 Notes: Wound/Skin Impairment Nursing Diagnoses: Impaired tissue integrity Knowledge deficit related to ulceration/compromised skin integrity Goals: Patient/caregiver will verbalize understanding of skin care regimen Date Initiated: 01/17/2020 Target Resolution Date: 09/25/2020 Goal Status: Active Ulcer/skin breakdown will have a volume reduction of 30% by week 4 Date Initiated: 01/17/2020 Date Inactivated: 02/14/2020 Target Resolution Date: 02/14/2020 Goal Status: Met Ulcer/skin breakdown will have a volume reduction of 50% by week 8 Date Initiated: 02/14/2020 Date Inactivated: 03/13/2020 Target Resolution Date: 03/13/2020 Goal Status: Met Ulcer/skin breakdown will have a volume reduction of 80% by week 12 Date Initiated: 03/13/2020 Date Inactivated: 04/10/2020 Target Resolution Date: 04/10/2020 Goal Status: Unmet Unmet Reason: infection Interventions: Assess patient/caregiver ability to obtain necessary supplies Assess patient/caregiver ability to perform ulcer/skin care regimen upon admission and as needed Assess ulceration(s) every visit Provide  education on ulcer and skin care Treatment Activities: Skin care regimen initiated : 01/17/2020 Topical wound management initiated : 01/17/2020 Notes: Electronic Signature(s) Signed: 09/18/2020 5:10:26 PM By: Zenaida Deed RN, BSN Entered By: Zenaida Deed on 09/18/2020 13:08:57 -------------------------------------------------------------------------------- Pain Assessment Details Patient Name: Date of Service: Delton [place] RA E. 09/18/2020 1:00 PM Medical Record Number: [id number] Patient Account Number: [id number] Date of Birth/Sex: Treating RN: 1946/05/13 (75 y.o. [name] Primary Care Brown Dunlap: Cheryll Cockayne Other Clinician: Referring [place]ua: Treating Tildon Silveria/Extender: Rickard Patience in Treatment: 62 Active Problems Location of Pain Severity and Description of Pain Patient Has Paino No Site Locations Pain Management and Medication Current Pain Management: Electronic Signature(s) Signed: 09/18/2020 5:02:12 PM By: Zandra Abts RN, BSN Entered By: Zandra Abts on 09/18/2020 13:06:43 -------------------------------------------------------------------------------- Patient/Caregiver Education Details Patient Name: Date of Service: Virl Diamond 2/9/2022andnbsp1:00 PM Medical Record Number: [id number] Patient Account Number: [id number] Date of Birth/Gender: Treating RN: [birth year] (75 y.o. [name] Primary Care Physician: Cheryll Cockayne Other Clinician: Referring Physician: Treating Physician/Extender: Rickard Patience in Treatment: 60 Education Assessment Education Provided To: Patient Education Topics Provided Offloading: Methods: Explain/Verbal Responses: Reinforcements needed, State content correctly Wound/Skin Impairment: Methods: Explain/Verbal Responses: Reinforcements needed, State content correctly Electronic Signature(s) Signed: 09/18/2020 5:10:26 PM By: Zenaida Deed RN, BSN Entered By:  Zenaida Deed on 09/18/2020 13:09:25 -------------------------------------------------------------------------------- Wound Assessment Details Patient Name: Date of Service: Berke, [name] 09/18/2020 1:00 PM Medical Record Number: [id number] Patient Account Number: [id number] Date of Birth/Sex: Treating RN: [birth year] (75 y.o. [name] Primary Care Felesia Stahlecker: Cheryll Cockayne Other Clinician: Referring Kuuipo Anzaldo: Treating Otto Felkins/Extender: Atlee Abide Weeks in Treatment: 35 Wound Status Wound Number: [id number] Primary Diabetic Wound/Ulcer of the Lower Extremity Etiology: Wound Location: Left, Medial, Plantar Foot Wound Open Wounding Event: Gradually Appeared Status: Date Acquired: 05/11/2019 Comorbid Sleep Apnea, Deep Vein Thrombosis, Hypertension, Colitis, Type Weeks Of Treatment: 35 History: II Diabetes, Gout, Osteoarthritis, Neuropathy Clustered Wound: No Wound Measurements Length: (cm) 0.2 Width: (cm) 0.2 Depth: (cm) 0.1 Area: (cm) 0.031 Volume: (cm) 0.003 % Reduction in Area: 97% % Reduction in Volume: 99.3% Epithelialization: Large (67-100%) Tunneling: No Undermining: No Wound Description Classification: Grade 2 Wound Margin: Distinct, outline attached Exudate Amount: Small Exudate Type: Sanguinous Exudate Color: red Foul Odor After Cleansing: No Slough/Fibrino No Wound Bed Granulation Amount: Large (67-100%) Exposed Structure Granulation Quality: Pink Fascia Exposed: No Necrotic Amount: None Present (0%) Fat Layer (Subcutaneous Tissue) Exposed: Yes Tendon Exposed: No Muscle Exposed: No Joint Exposed: No Bone Exposed: No Treatment Notes Wound #1RR (Foot)  Wound Laterality: Plantar, Left, Medial Cleanser Soap and Water Discharge Instruction: May shower and wash wound with dial antibacterial soap and water prior to dressing change. Peri-Wound Care Topical Primary Dressing KerraCel Ag Gelling Fiber Dressing, 2x2 in (silver  alginate) Discharge Instruction: Apply silver alginate to wound bed as instructed Secondary Dressing Woven Gauze Sponges 2x2 in Discharge Instruction: Apply over primary dressing as directed. Optifoam Non-Adhesive Dressing, 4x4 in Discharge Instruction: Apply over primary dressing cut to make foam donut Secured With Conforming Stretch Gauze Bandage, Sterile 2x75 (in/in) Discharge Instruction: Secure with stretch gauze as directed. 54M Medipore H Soft Cloth Surgical T 4 x 2 (in/yd) ape Discharge Instruction: Secure dressing with tape as directed. Compression Wrap Compression Stockings Add-Ons Electronic Signature(s) Signed: 09/18/2020 5:02:12 PM By: Levan Hurst RN, BSN Entered By: Levan Hurst on 09/18/2020 13:07:11 -------------------------------------------------------------------------------- Vitals Details Patient Name: Date of Service: [place], McNair RBA RA E. 09/18/2020 1:00 PM Medical Record Number: [id number] Patient Account Number: [id number] Date of Birth/Sex: Treating RN: 05-23-1946 (75 y.o. [name] Primary Care Mayreli Alden: [name] Other Clinician: Referring Haydn Cush: Treating Gunnard Dorrance/Extender: Adele Schilder in Treatment: 35 Vital Signs Time Taken: 12:58 Temperature (F): 98.2 Height (in): 66 Pulse (bpm): 84 Weight (lbs): 245 Respiratory Rate (breaths/min): 18 Body Mass Index (BMI): 39.5 Blood Pressure (mmHg): 145/77 Reference Range: 80 - 120 mg / dl Electronic Signature(s) Signed: 09/18/2020 5:02:12 PM By: Levan Hurst RN, BSN Entered By: Levan Hurst on 09/18/2020 13:06:37

## 2020-09-19 DIAGNOSIS — Z23 Encounter for immunization: Secondary | ICD-10-CM | POA: Diagnosis not present

## 2020-09-19 DIAGNOSIS — L4059 Other psoriatic arthropathy: Secondary | ICD-10-CM | POA: Diagnosis not present

## 2020-09-23 ENCOUNTER — Other Ambulatory Visit: Payer: Medicare Other

## 2020-09-25 ENCOUNTER — Encounter (HOSPITAL_BASED_OUTPATIENT_CLINIC_OR_DEPARTMENT_OTHER): Payer: Medicare Other | Admitting: Physician Assistant

## 2020-09-25 ENCOUNTER — Other Ambulatory Visit: Payer: Self-pay

## 2020-09-25 DIAGNOSIS — L97522 Non-pressure chronic ulcer of other part of left foot with fat layer exposed: Secondary | ICD-10-CM | POA: Diagnosis not present

## 2020-09-25 DIAGNOSIS — E11621 Type 2 diabetes mellitus with foot ulcer: Secondary | ICD-10-CM | POA: Diagnosis not present

## 2020-09-25 DIAGNOSIS — E1140 Type 2 diabetes mellitus with diabetic neuropathy, unspecified: Secondary | ICD-10-CM | POA: Diagnosis not present

## 2020-09-25 NOTE — Progress Notes (Addendum)
LUVERNE, FARONE ([id number]) Visit Report for 09/25/2020 Chief Complaint Document Details Patient Name: Date of Service: [name] [name], [name] [name]. 09/25/2020 12:45 PM Medical Record Number: [id number] Patient Account Number: [id number] Date of Birth/Sex: Treating RN: September 06, 1945 (75 y.o. [name] Other Clinician: Referring Provider: Treating Provider/Extender: Adele Schilder in Treatment: 36 Information Obtained from: Patient Chief Complaint Left foot ulcer Electronic Signature(s) Signed: 09/25/2020 12:46:38 PM By: Worthy Keeler PA-C Entered By: Worthy Keeler on 09/25/2020 12:46:38 -------------------------------------------------------------------------------- HPI Details Patient Name: Date of Service: [name] [name], [name] [name]. 09/25/2020 12:45 PM Medical Record Number: [id number] Patient Account Number: [id number] Date of Birth/Sex: Treating RN: Oct 18, 1945 (75 y.o. [name] Other Clinician: Referring Provider: Treating Provider/Extender: Adele Schilder in Treatment: 41 History of Present Illness HPI Description: 01/17/2020 upon evaluation today patient presents for initial evaluation here in our clinic concerning issues she has been having with a left medial/plantar foot ulcer. This is actually been an issue for her since October 2020. She has been seeing Dr. Doran Durand for quite some time during that course. Fortunately there is no signs of active infection at this time. Or least no mention of this to have seen in general. With that being said unfortunately I do see some signs of erythema noted today that does have me concerned about the possibility of infection at this point in the surrounding area of the wound. There is also a warm to touch at the site which is somewhat concerning. Fortunately there is no evidence of systemic infection which is great news. The patient  does have a history of diabetes mellitus type 2, Charcot foot which is what led to the wound, and hypertension. She notes that she was in a cast for some time with Dr. Doran Durand for about 8 weeks. During that time they were utilizing according to the patient silver nitrate along with a foam doughnut and then Coban to secure in place in the cast in place. With that being said I do not have the actual records to review we are going to try to get a hold of those unfortunately they would not flow over into care everywhere I did look today. She has been seeing Dr. Doran Durand and his physician assistant Larkin Ina up until the end of May and apparently is still seeing them on a regular basis every 2 weeks roughly. She has also tried Iodosorb without effect here. 01/24/2020 upon evaluation today patient actually appears to be doing quite well with regard to her wounds. She has been tolerating the dressing changes without complication. Fortunately there is no signs of active infection spreading which is good news. Her culture did show signs of Staph aureus I did place her on Augmentin due to the erythema surrounding the wound. With that being said the wound does appear to be doing better she has her longer walking cast/boot and I think that is actually good for her for the time being. I am considering reinitiating total contact cast when she gets back from vacation but next week she will actually be out of town at [employer] she knows not to get in the water but she still obviously is planning to enjoy herself she is going to take it easy on her foot however. 02/07/2020 upon evaluation today patient appears to be doing fairly well in regard to her ulcer on her foot. Fortunately there is no signs of  severe infection at this time which is great news and overall very pleased in that regard. With that being said I do think that she could still benefit from a total contact cast. Nonetheless she is using her walking boot which at  least provide some protection and that it prevents some of the friction occurring when she is ambulating. 02/14/2020 upon evaluation today patient appears to be doing well with regard to her foot ulcer. This is actually measuring a little bit smaller yet again this week. Overall very pleased with where things stand and I do not see any signs of active infection at this time which is also good news. Since she is measuring better the patient has wanting to somewhat hold off on proceeding with the total contact cast which I think is reasonable at this point. 02/28/2020 on evaluation today patient appears to be doing well in general in regard to her wound although she has a lot of callus buildup as compared to last time I saw her. This is can require sharp debridement today. I do believe she really needs the total contact cast as well which we have discussed previous. 7/23; patient comes in for a total contact cast change 03/06/2020 on evaluation today patient appears to be doing quite well with regard to her wounds. Fortunately the wound bed is measuring smaller and looking much better there is little callus noted although there is some debridement necessary today. 03/13/2020 on evaluation today patient's wound actually appears to be doing excellent which is great news. With that being said unfortunately she is having some issues currently with her left leg where she does have cellulitis it appears. This may have come from an area that rubbed underneath the cast from last week that we noted we padded that area and it looks to be doing excellent at this point but nonetheless the leg was somewhat painful, swollen, and somewhat erythematous. She also had an elevated white blood cell count of 11.5 based on what I saw on looking at her records from the med center in [place] from where she was seen yesterday. Unfortunately with [place] having a provider on vacation there was no one here in the clinic in the afternoon  when she called therefore she went to the ER as advised. Subsequently they did not cut off the cast as they did not have anyone from orthopedics there to do so and subsequently also did not have the ability to do the Doppler for evaluation of DVT They recommended therefore given her dose of Eliquis as well as . Augmentin and sent her home to come see [place] today to have the cast taken off and then she is supposed to go back to have the study for DVT performed they are following. 03/20/2020 upon evaluation today patient appears to be doing well with regard to her foot all things considered we have not been able to use the total contact cast due to the infection that she had last week. She has been on the doxycycline and she had a 10-day supply of that I do believe that is helping and her leg appears to be doing better. With that being said there is fortunately no signs of active infection systemically at this time which is good news. No fevers, chills, nausea, vomiting, or diarrhea. 03/27/2020 upon evaluation today patient appears to be doing well with regard to her foot ulcer. There does not appear to be signs of active infection which is great news. Overall I  am very pleased with where things stand at this point. 04/03/2020 upon evaluation today patient appears to be doing pretty well in regard to the overall appearance of her wound. Fortunately there is no signs of active infection at this time which is great news. No fevers, chills, nausea, vomiting, or diarrhea. With that being said she does have some blue-green drainage that actually is a little bit concerning to me for the possibility of Pseudomonas. I discussed that with the patient today. With that being said I do believe that we may be able to manage this however with the topical antibiotic cream as opposed to having to do anything oral especially since she seems to be doing so well with overall appearance of the wound. 04/10/2020 on evaluation  today patient appears to be doing about the same roughly in regard to the overall size of her wound. With that being said she fortunately has not shown any signs of worsening overall which is good news. I do believe that she is doing a great job trying to offload but again she may still do better with the cast. I do not see in the blue-green drainage that we noticed previously I do believe the gentamicin help in this regard. 04/17/2020 on evaluation today patient's wound appears to be doing about the same at this point. There is no significant improvement at this point. No fever chills noted. She is up for put the cast back on the day. That she states in a couple weeks she will need to have this off to go to a workshop. 04/24/2020 on evaluation today patient appears to be doing significantly better in regard to her wound. Fortunately there is no signs of active infection and overall feel like she is making great progress the cast seems to have done excellent for her. 05/01/2020 upon evaluation today patient presents for reevaluation she really does not appear to be doing too badly in regard to the actual wound on the left foot we have been managing. Unfortunately she has bilateral lower extremity edema with blisters between the webspace of her first and second toe on both feet. She has a tremendous amount of edema in the legs which I think is where this is coming from it does not appear to be infected but nonetheless I do believe this is can be something that needs to be addressed today. Obviously this means we probably will not be putting the cast on at this point. She attributes this to the fact that she was sitting with her feet on the floor much longer during a conference last week she had a great time but unfortunately had a lot of complications as a result. 05/08/2020 upon evaluation today patient appears to be doing somewhat better in regard to her wounds at this time. Fortunately there is no signs of  active infection which is great news. With that being said I do believe that the blisters have ruptured and unfortunately did not just reattach I will remove some of the blistered tissue today. With that being said I do think the wound itself on the plantar aspect of left foot does need to have sharp debridement. 05/15/2020 upon evaluation today patient appears to be doing about the same in regard to her foot ulcer. Unfortunately in the past week her husband had a fall where he sustained a mild traumatic brain bleed. Fortunately he is doing better but being that he was in the hospital she had a walk on this a lot more.  The wound does not appear to be any better is also not really appearing to be significantly worse which is good news. There is no signs of active infection at this time. 10/14; patient with a small diabetic wound on the medial part of her left foot. We have been using silver collagen a total contact cast making good progress. I think the patient had a series of blisters on her dorsal foot probably secondary to having her legs recumbent for 3 days while in a conference in [place]. We wrapped her leg last week these are all healed. We did not previously have her in compression on the right leg. 05/29/2020 upon evaluation today patient appears to be doing well with regard to the wound on the plantar aspect of her foot medially. This is measuring smaller and looking much better than last time I saw her. Again when I did see her last was 2 weeks back and the wound was significantly larger. I do believe the cast is helping and I believe the collagen is a good option for her. 06/05/2020 on evaluation today patient appears to be doing well with regard to her foot ulcer this is actually measuring significantly better and overall I feel like she is doing excellent. There is no signs of active infection at this time. 06/12/2020 upon evaluation today patient actually continues to show signs of good  improvement which is excellent news. I am extremely pleased with how she seems to be progressing at this point in regard to her wound. There is still some depth to the wound but I do believe the collagen is helping her quite a bit. 06/19/2020 upon evaluation today patient appears to be doing well with regard to her plantar foot ulcer. She is actually making excellent progress and in fact this appears to be almost completely healed. With that being said I do believe that the patient is going to actually require 1 more week in the cast although after that I am hopeful she will be ready for discharge. 06/26/2020 on evaluation today patient appears to be doing well in regard to her wound currently. Fortunately there is no signs of active infection in general I feel like she is doing excellent. This appears to be completely healed I think she is ready to come out of the cast. 11/29; patient comes back in the clinic today with a very quick reopening in the exact same area on the medial plantar foot. She had been healed out last time. She went back into some new balance shoes that she got at [employer] with a custom insert. As it turns out this wound also happened when wearing these shoes although there was some modification made I think with the wound initially happened they added some foam around the wound area. This obviously is not going to be sufficient. 07/17/2020 on evaluation today patient appears to be doing well with regard to her wound. Fortunately she seems to be making good progress. Unfortunately she was in the hospital due to a issue with colitis and had just been discharged today in fact. She tells me that her biggest concern here is that a lot of her numbers especially her creatinine were somewhat elevated and problematic. She is can be following up with her provider in order to have a further work-up at this time. With that being said she did need to come back in for a cast change. She did not  allow them to remove the cast due to the fact that  she did not feel like that was the issue whatsoever and indeed it does not appear that was the case her wound appears to be doing excellent today. 07/24/2020 upon evaluation today patient appears to be doing well in regard to her foot ulcer. She still has a small opening but this is showing signs of excellent improvement overall but they were very close to complete resolution. No fevers, chills, nausea, vomiting, or diarrhea. 07/31/2020 upon evaluation today patient actually appears to be doing excellent she is actually completely healed this is great news. Fortunately there is no signs of active infection at this time. No fevers, chills, nausea, vomiting, or diarrhea. The patient tells me that she did see Dr. Paulla Dolly in order to get to [employer] so that she can have a custom shoe made. She saw him earlier last week. She does have an appointment with Liliane Channel on the sixth I believe on January she tells me Readmission: 08/28/2020 on evaluation today patient appears to be doing poorly in regard to her wound currently. She tells me this has reopened. The good news that she did get measured for and actually her shoes are on the way in from [facility].. With that being said she has been trying to stay off of this is much as possible using her wheelchair around home. Nonetheless has been somewhat difficult. The good news is her shoes should be here next week 09/04/2020 upon evaluation today patient appears to be doing well with regard to her wound. There is a little bit of callus buildup but nothing too significant she does tell me she was very active this week. Fortunately there is no signs of systemic infection at this point. The dorsal foot wound actually appears to be doing better. I think this is healed. 09/11/2020 upon evaluation today patient appears to actually be doing quite well in my opinion based on what I am seeing today. Fortunately there is no signs  of infection in fact her mother is certain that this is even open any longer based on what I see. Fortunately I think the patient has been doing everything she can to try to keep this under control. 09/18/2020 upon evaluation today patient appears to be doing about the same in regard to her foot ulcer. She has been tolerating the dressing changes without complication. Fortunately there is no sign of active infection at this time. No fevers, chills, nausea, vomiting, or diarrhea. She is going require some sharp debridement today. 09/25/2020 upon evaluation today patient appears to be doing well with regard to his wounds she has been tolerating the dressing changes without complication. Her wound appears to be completely healed based on what I am seeing at this point. There does not appear to be any signs of active infection at this time which is great news. No fevers, chills, nausea, vomiting, or diarrhea. She did get the cushion for her foot as well that she ordered to try to help keep pressure off of this area. That looks like it may be very beneficial for her to be honest. Electronic Signature(s) Signed: 09/25/2020 1:33:44 PM By: Worthy Keeler PA-C Entered By: Worthy Keeler on 09/25/2020 13:33:44 -------------------------------------------------------------------------------- Physical Exam Details Patient Name: Date of Service: [place], [name] [name]. 09/25/2020 12:45 PM Medical Record Number: [id number] Patient Account Number: [id number] Date of Birth/Sex: Treating RN: Mar 26, 1946 (75 y.o. [name] Other Clinician: Referring Provider: Treating Provider/Extender: Landis Martins Weeks in Treatment:  38 Constitutional Well-nourished and well-hydrated in no acute distress. Respiratory normal breathing without difficulty. Psychiatric this patient is able to make decisions and demonstrates good insight into disease process. Alert and  Oriented x 3. pleasant and cooperative. Notes Upon inspection patient's wound bed actually is showing signs of complete epithelization there is really no callus buildup whatsoever and I think she has done extremely good job of keeping pressure off over the past week. Electronic Signature(s) Signed: 09/25/2020 1:34:00 PM By: Worthy Keeler PA-C Entered By: Worthy Keeler on 09/25/2020 13:34:00 -------------------------------------------------------------------------------- Physician Orders Details Patient Name: Date of Service: [place], [name] [name]. 09/25/2020 12:45 PM Medical Record Number: [id number] Patient Account Number: [id number] Date of Birth/Sex: Treating RN: 07-09-1946 (75 y.o. [name], [name] Primary Care Provider: [name] Other Clinician: Referring Provider: Treating Provider/Extender: Adele Schilder in Treatment: 69 Verbal / Phone Orders: No Diagnosis Coding ICD-10 Coding Code Description E11.621 Type 2 diabetes mellitus with foot ulcer L97.522 Non-pressure chronic ulcer of other part of left foot with fat layer exposed M14.672 Charcot's joint, left ankle and foot E11.40 Type 2 diabetes mellitus with diabetic neuropathy, unspecified Discharge From [facility] Services Discharge from [place] Bathing/ Shower/ Hygiene May shower and wash wound with soap and water. Edema Control - Lymphedema / SCD / Other Bilateral Lower Extremities Elevate legs to the level of the heart or above for 30 minutes daily and/or when sitting, a frequency of: Avoid standing for long periods of time. Moisturize legs daily. - and feet Off-Loading Other: - gel pad to bottom of left foot daily Electronic Signature(s) Signed: 09/25/2020 5:28:15 PM By: Worthy Keeler PA-C Signed: 09/25/2020 6:01:36 PM By: [name] RN, BSN Entered By: [name] on 09/25/2020 13:29:28 -------------------------------------------------------------------------------- Problem List  Details Patient Name: Date of Service: [name] [name] RA [name]. 09/25/2020 12:45 PM Medical Record Number: [id number] Patient Account Number: [id number] Date of Birth/Sex: Treating RN: 07/03/1946 (75 y.o. [name] Other Clinician: Referring Provider: Treating Provider/Extender: Adele Schilder in Treatment: 67 Active Problems ICD-10 Encounter Code Description Active Date MDM Diagnosis E11.621 Type 2 diabetes mellitus with foot ulcer 01/17/2020 No Yes L97.522 Non-pressure chronic ulcer of other part of left foot with fat layer exposed 01/17/2020 No Yes M14.672 Charcot's joint, left ankle and foot 01/17/2020 No Yes E11.40 Type 2 diabetes mellitus with diabetic neuropathy, unspecified 01/17/2020 No Yes Inactive Problems ICD-10 Code Description Active Date Inactive Date I10 Essential (primary) hypertension 01/17/2020 01/17/2020 Resolved Problems Electronic Signature(s) Signed: 09/25/2020 12:46:33 PM By: Worthy Keeler PA-C Entered By: Worthy Keeler on 09/25/2020 12:46:33 -------------------------------------------------------------------------------- Progress Note Details Patient Name: Date of Service: [name] [name], [name] [name] RA [name]. 09/25/2020 12:45 PM Medical Record Number: [id number] Patient Account Number: [id number] Date of Birth/Sex: Treating RN: [birth year] (75 y.o. [name] Other Clinician: Referring Provider: Treating Provider/Extender: Adele Schilder in Treatment: 60 Subjective Chief Complaint Information obtained from Patient Left foot ulcer History of Present Illness (HPI) 01/17/2020 upon evaluation today patient presents for initial evaluation here in our clinic concerning issues she has been having with a left medial/plantar foot ulcer. This is actually been an issue for her since October 2020. She has been seeing Dr. Doran Durand for quite some time during that course.  Fortunately there is no signs of active infection at this time. Or least no mention of this to have seen in general. With that  being said unfortunately I do see some signs of erythema noted today that does have me concerned about the possibility of infection at this point in the surrounding area of the wound. There is also a warm to touch at the site which is somewhat concerning. Fortunately there is no evidence of systemic infection which is great news. The patient does have a history of diabetes mellitus type 2, Charcot foot which is what led to the wound, and hypertension. She notes that she was in a cast for some time with Dr. Doran Durand for about 8 weeks. During that time they were utilizing according to the patient silver nitrate along with a foam doughnut and then Coban to secure in place in the cast in place. With that being said I do not have the actual records to review we are going to try to get a hold of those unfortunately they would not flow over into care everywhere I did look today. She has been seeing Dr. Doran Durand and his physician assistant Larkin Ina up until the end of May and apparently is still seeing them on a regular basis every 2 weeks roughly. She has also tried Iodosorb without effect here. 01/24/2020 upon evaluation today patient actually appears to be doing quite well with regard to her wounds. She has been tolerating the dressing changes without complication. Fortunately there is no signs of active infection spreading which is good news. Her culture did show signs of Staph aureus I did place her on Augmentin due to the erythema surrounding the wound. With that being said the wound does appear to be doing better she has her longer walking cast/boot and I think that is actually good for her for the time being. I am considering reinitiating total contact cast when she gets back from vacation but next week she will actually be out of town at [employer] she knows not to get in the water but  she still obviously is planning to enjoy herself she is going to take it easy on her foot however. 02/07/2020 upon evaluation today patient appears to be doing fairly well in regard to her ulcer on her foot. Fortunately there is no signs of severe infection at this time which is great news and overall very pleased in that regard. With that being said I do think that she could still benefit from a total contact cast. Nonetheless she is using her walking boot which at least provide some protection and that it prevents some of the friction occurring when she is ambulating. 02/14/2020 upon evaluation today patient appears to be doing well with regard to her foot ulcer. This is actually measuring a little bit smaller yet again this week. Overall very pleased with where things stand and I do not see any signs of active infection at this time which is also good news. Since she is measuring better the patient has wanting to somewhat hold off on proceeding with the total contact cast which I think is reasonable at this point. 02/28/2020 on evaluation today patient appears to be doing well in general in regard to her wound although she has a lot of callus buildup as compared to last time I saw her. This is can require sharp debridement today. I do believe she really needs the total contact cast as well which we have discussed previous. 7/23; patient comes in for a total contact cast change 03/06/2020 on evaluation today patient appears to be doing quite well with regard to her wounds. Fortunately the wound  bed is measuring smaller and looking much better there is little callus noted although there is some debridement necessary today. 03/13/2020 on evaluation today patient's wound actually appears to be doing excellent which is great news. With that being said unfortunately she is having some issues currently with her left leg where she does have cellulitis it appears. This may have come from an area that rubbed  underneath the cast from last week that we noted we padded that area and it looks to be doing excellent at this point but nonetheless the leg was somewhat painful, swollen, and somewhat erythematous. She also had an elevated white blood cell count of 11.5 based on what I saw on looking at her records from the med center in [facility] from where she was seen yesterday. Unfortunately with [place] having a provider on vacation there was no one here in the clinic in the afternoon when she called therefore she went to the ER as advised. Subsequently they did not cut off the cast as they did not have anyone from orthopedics there to do so and subsequently also did not have the ability to do the Doppler for evaluation of DVT They recommended therefore given her dose of Eliquis as well as . Augmentin and sent her home to come see [place] today to have the cast taken off and then she is supposed to go back to have the study for DVT performed they are following. 03/20/2020 upon evaluation today patient appears to be doing well with regard to her foot all things considered we have not been able to use the total contact cast due to the infection that she had last week. She has been on the doxycycline and she had a 10-day supply of that I do believe that is helping and her leg appears to be doing better. With that being said there is fortunately no signs of active infection systemically at this time which is good news. No fevers, chills, nausea, vomiting, or diarrhea. 03/27/2020 upon evaluation today patient appears to be doing well with regard to her foot ulcer. There does not appear to be signs of active infection which is great news. Overall I am very pleased with where things stand at this point. 04/03/2020 upon evaluation today patient appears to be doing pretty well in regard to the overall appearance of her wound. Fortunately there is no signs of active infection at this time which is great news. No fevers, chills,  nausea, vomiting, or diarrhea. With that being said she does have some blue-green drainage that actually is a little bit concerning to me for the possibility of Pseudomonas. I discussed that with the patient today. With that being said I do believe that we may be able to manage this however with the topical antibiotic cream as opposed to having to do anything oral especially since she seems to be doing so well with overall appearance of the wound. 04/10/2020 on evaluation today patient appears to be doing about the same roughly in regard to the overall size of her wound. With that being said she fortunately has not shown any signs of worsening overall which is good news. I do believe that she is doing a great job trying to offload but again she may still do better with the cast. I do not see in the blue-green drainage that we noticed previously I do believe the gentamicin help in this regard. 04/17/2020 on evaluation today patient's wound appears to be doing about the same at this  point. There is no significant improvement at this point. No fever chills noted. She is up for put the cast back on the day. That she states in a couple weeks she will need to have this off to go to a workshop. 04/24/2020 on evaluation today patient appears to be doing significantly better in regard to her wound. Fortunately there is no signs of active infection and overall feel like she is making great progress the cast seems to have done excellent for her. 05/01/2020 upon evaluation today patient presents for reevaluation she really does not appear to be doing too badly in regard to the actual wound on the left foot we have been managing. Unfortunately she has bilateral lower extremity edema with blisters between the webspace of her first and second toe on both feet. She has a tremendous amount of edema in the legs which I think is where this is coming from it does not appear to be infected but nonetheless I do believe this is  can be something that needs to be addressed today. Obviously this means we probably will not be putting the cast on at this point. She attributes this to the fact that she was sitting with her feet on the floor much longer during a conference last week she had a great time but unfortunately had a lot of complications as a result. 05/08/2020 upon evaluation today patient appears to be doing somewhat better in regard to her wounds at this time. Fortunately there is no signs of active infection which is great news. With that being said I do believe that the blisters have ruptured and unfortunately did not just reattach I will remove some of the blistered tissue today. With that being said I do think the wound itself on the plantar aspect of left foot does need to have sharp debridement. 05/15/2020 upon evaluation today patient appears to be doing about the same in regard to her foot ulcer. Unfortunately in the past week her husband had a fall where he sustained a mild traumatic brain bleed. Fortunately he is doing better but being that he was in the hospital she had a walk on this a lot more. The wound does not appear to be any better is also not really appearing to be significantly worse which is good news. There is no signs of active infection at this time. 10/14; patient with a small diabetic wound on the medial part of her left foot. We have been using silver collagen a total contact cast making good progress. I think the patient had a series of blisters on her dorsal foot probably secondary to having her legs recumbent for 3 days while in a conference in [place]. We wrapped her leg last week these are all healed. We did not previously have her in compression on the right leg. 05/29/2020 upon evaluation today patient appears to be doing well with regard to the wound on the plantar aspect of her foot medially. This is measuring smaller and looking much better than last time I saw her. Again when I did  see her last was 2 weeks back and the wound was significantly larger. I do believe the cast is helping and I believe the collagen is a good option for her. 06/05/2020 on evaluation today patient appears to be doing well with regard to her foot ulcer this is actually measuring significantly better and overall I feel like she is doing excellent. There is no signs of active infection at this time. 06/12/2020 upon  evaluation today patient actually continues to show signs of good improvement which is excellent news. I am extremely pleased with how she seems to be progressing at this point in regard to her wound. There is still some depth to the wound but I do believe the collagen is helping her quite a bit. 06/19/2020 upon evaluation today patient appears to be doing well with regard to her plantar foot ulcer. She is actually making excellent progress and in fact this appears to be almost completely healed. With that being said I do believe that the patient is going to actually require 1 more week in the cast although after that I am hopeful she will be ready for discharge. 06/26/2020 on evaluation today patient appears to be doing well in regard to her wound currently. Fortunately there is no signs of active infection in general I feel like she is doing excellent. This appears to be completely healed I think she is ready to come out of the cast. 11/29; patient comes back in the clinic today with a very quick reopening in the exact same area on the medial plantar foot. She had been healed out last time. She went back into some new balance shoes that she got at [employer] with a custom insert. As it turns out this wound also happened when wearing these shoes although there was some modification made I think with the wound initially happened they added some foam around the wound area. This obviously is not going to be sufficient. 07/17/2020 on evaluation today patient appears to be doing well with regard to her  wound. Fortunately she seems to be making good progress. Unfortunately she was in the hospital due to a issue with colitis and had just been discharged today in fact. She tells me that her biggest concern here is that a lot of her numbers especially her creatinine were somewhat elevated and problematic. She is can be following up with her provider in order to have a further work-up at this time. With that being said she did need to come back in for a cast change. She did not allow them to remove the cast due to the fact that she did not feel like that was the issue whatsoever and indeed it does not appear that was the case her wound appears to be doing excellent today. 07/24/2020 upon evaluation today patient appears to be doing well in regard to her foot ulcer. She still has a small opening but this is showing signs of excellent improvement overall but they were very close to complete resolution. No fevers, chills, nausea, vomiting, or diarrhea. 07/31/2020 upon evaluation today patient actually appears to be doing excellent she is actually completely healed this is great news. Fortunately there is no signs of active infection at this time. No fevers, chills, nausea, vomiting, or diarrhea. The patient tells me that she did see Dr. Paulla Dolly in order to get to [place] so that she can have a custom shoe made. She saw him earlier last week. She does have an appointment with Liliane Channel on the sixth I believe on January she tells me Readmission: 08/28/2020 on evaluation today patient appears to be doing poorly in regard to her wound currently. She tells me this has reopened. The good news that she did get measured for and actually her shoes are on the way in from [facility].. With that being said she has been trying to stay off of this is much as possible using her wheelchair around home.  Nonetheless has been somewhat difficult. The good news is her shoes should be here next week 09/04/2020 upon evaluation today  patient appears to be doing well with regard to her wound. There is a little bit of callus buildup but nothing too significant she does tell me she was very active this week. Fortunately there is no signs of systemic infection at this point. The dorsal foot wound actually appears to be doing better. I think this is healed. 09/11/2020 upon evaluation today patient appears to actually be doing quite well in my opinion based on what I am seeing today. Fortunately there is no signs of infection in fact her mother is certain that this is even open any longer based on what I see. Fortunately I think the patient has been doing everything she can to try to keep this under control. 09/18/2020 upon evaluation today patient appears to be doing about the same in regard to her foot ulcer. She has been tolerating the dressing changes without complication. Fortunately there is no sign of active infection at this time. No fevers, chills, nausea, vomiting, or diarrhea. She is going require some sharp debridement today. 09/25/2020 upon evaluation today patient appears to be doing well with regard to his wounds she has been tolerating the dressing changes without complication. Her wound appears to be completely healed based on what I am seeing at this point. There does not appear to be any signs of active infection at this time which is great news. No fevers, chills, nausea, vomiting, or diarrhea. She did get the cushion for her foot as well that she ordered to try to help keep pressure off of this area. That looks like it may be very beneficial for her to be honest. Objective Constitutional Well-nourished and well-hydrated in no acute distress. Vitals Time Taken: 12:43 PM, Height: 66 in, Weight: 245 lbs, BMI: 39.5, Temperature: 98.0 F, Pulse: 91 bpm, Respiratory Rate: 16 breaths/min, Blood Pressure: 153/70 mmHg. Respiratory normal breathing without difficulty. Psychiatric this patient is able to make decisions and  demonstrates good insight into disease process. Alert and Oriented x 3. pleasant and cooperative. General Notes: Upon inspection patient's wound bed actually is showing signs of complete epithelization there is really no callus buildup whatsoever and I think she has done extremely good job of keeping pressure off over the past week. Integumentary (Hair, Skin) Wound #1RR status is Open. Original cause of wound was Gradually Appeared. The wound is located on the [place]. The wound measures 0cm length x 0cm width x 0cm depth; 0cm^2 area and 0cm^3 volume. There is no tunneling or undermining noted. There is a none present amount of drainage noted. The wound margin is distinct with the outline attached to the wound base. There is no granulation within the wound bed. There is no necrotic tissue within the wound bed. Assessment Active Problems ICD-10 Type 2 diabetes mellitus with foot ulcer Non-pressure chronic ulcer of other part of left foot with fat layer exposed Charcot's joint, left ankle and foot Type 2 diabetes mellitus with diabetic neuropathy, unspecified Plan Discharge From [facility] Services: Discharge from [place] [facility]/ Shower/ Hygiene: May shower and wash wound with soap and water. Edema Control - Lymphedema / SCD / Other: Elevate legs to the level of the heart or above for 30 minutes daily and/or when sitting, a frequency of: Avoid standing for long periods of time. Moisturize legs daily. - and feet Off-Loading: Other: - gel pad to bottom of left foot daily 1.  Would recommend currently that we go ahead and discontinue wound care services as the patient appears to be completely healed. I think this is a great day for her and hopefully this is good to remain healed. I know she has had a lot of trouble with her foot up to this point. 2. I am also can recommend that we have her use the arch support pad that she purchased. It does look like this will be likely  beneficial for her. I did put it on today and actually this GelPad seems to be something that I think will be quite comfortable and prevent callus buildup and friction to the affected region. We will see her back for follow-up visit as needed. Fortunately she should be getting her diabetic shoes shortly. Electronic Signature(s) Signed: 09/25/2020 1:34:51 PM By: Worthy Keeler PA-C Entered By: Worthy Keeler on 09/25/2020 13:34:50 -------------------------------------------------------------------------------- SuperBill Details Patient Name: Date of Service: Corkins, [name] [name]. 09/25/2020 Medical Record Number: [id number] Patient Account Number: [id number] Date of Birth/Sex: Treating RN: 06-14-1946 (75 y.o. [name] Other Clinician: Referring Provider: Treating Provider/Extender: Adele Schilder in Treatment: 36 Diagnosis Coding ICD-10 Codes Code Description E11.621 Type 2 diabetes mellitus with foot ulcer L97.522 Non-pressure chronic ulcer of other part of left foot with fat layer exposed M14.672 Charcot's joint, left ankle and foot E11.40 Type 2 diabetes mellitus with diabetic neuropathy, unspecified Facility Procedures CPT4 Code: [id number] Description: 99213 - WOUND CARE VISIT-LEV 3 EST PT Modifier: Quantity: 1 Physician Procedures : CPT4 Code Description Modifier [id number] - WC PHYS LEVEL 3 - EST PT ICD-10 Diagnosis Description E11.621 Type 2 diabetes mellitus with foot ulcer L97.522 Non-pressure chronic ulcer of other part of left foot with fat layer exposed M14.672 Charcot's  joint, left ankle and foot E11.40 Type 2 diabetes mellitus with diabetic neuropathy, unspecified Quantity: 1 Electronic Signature(s) Signed: 09/25/2020 1:35:03 PM By: Worthy Keeler PA-C Entered By: Worthy Keeler on 09/25/2020 13:35:03

## 2020-09-27 ENCOUNTER — Ambulatory Visit
Admission: RE | Admit: 2020-09-27 | Discharge: 2020-09-27 | Disposition: A | Discharge status: Home / Self Care | Payer: Medicare Other | Source: Ambulatory Visit | Attending: Gastroenterology | Admitting: Gastroenterology

## 2020-09-27 DIAGNOSIS — R1310 Dysphagia, unspecified: Secondary | ICD-10-CM | POA: Diagnosis not present

## 2020-09-27 DIAGNOSIS — R1319 Other dysphagia: Secondary | ICD-10-CM

## 2020-09-27 NOTE — Progress Notes (Signed)
MIO, SCHELLINGER ([id number]) Visit Report for 09/25/2020 Arrival Information Details Patient Name: Date of Service: [name], [name] 09/25/2020 12:45 PM Medical Record Number: [id number] Patient Account Number: [id number] Date of Birth/Sex: Treating RN: 12-Jun-1946 (75 y.o. [name] [name] Primary Care Fumiye Lubben: [name] Other Clinician: Referring Tonna Palazzi: Treating Stormy Sabol/Extender: Adele Schilder in Treatment: 14 Visit Information History Since Last Visit Added or deleted any medications: No Patient Arrived: Cane Any new allergies or adverse reactions: No Arrival Time: 12:37 Had a fall or experienced change in No Accompanied By: alone activities of daily living that may affect Transfer Assistance: None risk of falls: Patient Identification Verified: Yes Signs or symptoms of abuse/neglect since No Secondary Verification Process Completed: Yes last visito Patient Requires Transmission-Based Precautions: No Hospitalized since last visit: No Patient Has Alerts: No Implantable device outside of the clinic No excluding cellular tissue based products placed in the center since last visit: Has Dressing in Place as Prescribed: Yes Has Footwear/Offloading in Place as Yes Prescribed: Left: Surgical Shoe with Pressure Relief Insole Pain Present Now: No Electronic Signature(s) Signed: 09/27/2020 4:25:56 PM By: Levan Hurst RN, BSN Entered By: Levan Hurst on 09/25/2020 12:43:31 -------------------------------------------------------------------------------- Clinic Level of Care Assessment Details Patient Name: Date of Service: [name], [name] [name] 09/25/2020 12:45 PM Medical Record Number: [id number] Patient Account Number: [id number] Date of Birth/Sex: Treating RN: May 08, 1946 (75 y.o. [name] Primary Care Nerea Bordenave: [name] Other Clinician: Referring Murray Durrell: Treating Chenoah Mcnally/Extender: Adele Schilder in Treatment:  36 Clinic Level of Care Assessment Items TOOL 4 Quantity Score []  - 0 Use when only an EandM is performed on FOLLOW-UP visit ASSESSMENTS - Nursing Assessment / Reassessment X- 1 10 Reassessment of Co-morbidities (includes updates in patient status) X- 1 5 Reassessment of Adherence to Treatment Plan ASSESSMENTS - Wound and Skin A ssessment / Reassessment X - Simple Wound Assessment / Reassessment - one wound 1 5 []  - 0 Complex Wound Assessment / Reassessment - multiple wounds []  - 0 Dermatologic / Skin Assessment (not related to wound area) ASSESSMENTS - Focused Assessment []  - 0 Circumferential Edema Measurements - multi extremities []  - 0 Nutritional Assessment / Counseling / Intervention X- 1 5 Lower Extremity Assessment (monofilament, tuning fork, pulses) []  - 0 Peripheral Arterial Disease Assessment (using hand held doppler) ASSESSMENTS - Ostomy and/or Continence Assessment and Care []  - 0 Incontinence Assessment and Management []  - 0 Ostomy Care Assessment and Management (repouching, etc.) PROCESS - Coordination of Care X - Simple Patient / Family Education for ongoing care 1 15 []  - 0 Complex (extensive) Patient / Family Education for ongoing care X- 1 10 Staff obtains Programmer, systems, Records, T Results / Process Orders est []  - 0 Staff telephones HHA, Nursing Homes / Clarify orders / etc []  - 0 Routine Transfer to another Facility (non-emergent condition) []  - 0 Routine Hospital Admission (non-emergent condition) []  - 0 New Admissions / Biomedical engineer / Ordering NPWT Apligraf, etc. , []  - 0 Emergency Hospital Admission (emergent condition) X- 1 10 Simple Discharge Coordination []  - 0 Complex (extensive) Discharge Coordination PROCESS - Special Needs []  - 0 Pediatric / Minor Patient Management []  - 0 Isolation Patient Management []  - 0 Hearing / Language / Visual special needs []  - 0 Assessment of Community assistance (transportation, D/C  planning, etc.) []  - 0 Additional assistance / Altered mentation []  - 0 Support Surface(s) Assessment (bed, cushion, seat, etc.) INTERVENTIONS - Wound Cleansing / Measurement X -  Simple Wound Cleansing - one wound 1 5 []  - 0 Complex Wound Cleansing - multiple wounds X- 1 5 Wound Imaging (photographs - any number of wounds) []  - 0 Wound Tracing (instead of photographs) []  - 0 Simple Wound Measurement - one wound []  - 0 Complex Wound Measurement - multiple wounds INTERVENTIONS - Wound Dressings X - Small Wound Dressing one or multiple wounds 1 10 []  - 0 Medium Wound Dressing one or multiple wounds []  - 0 Large Wound Dressing one or multiple wounds []  - 0 Application of Medications - topical []  - 0 Application of Medications - injection INTERVENTIONS - Miscellaneous []  - 0 External ear exam []  - 0 Specimen Collection (cultures, biopsies, blood, body fluids, etc.) []  - 0 Specimen(s) / Culture(s) sent or taken to Lab for analysis []  - 0 Patient Transfer (multiple staff / Civil Service fast streamer / Similar devices) []  - 0 Simple Staple / Suture removal (25 or less) []  - 0 Complex Staple / Suture removal (26 or more) []  - 0 Hypo / Hyperglycemic Management (close monitor of Blood Glucose) []  - 0 Ankle / Brachial Index (ABI) - do not check if billed separately X- 1 5 Vital Signs Has the patient been seen at the hospital within the last three years: Yes Total Score: 85 Level Of Care: New/Established - Level 3 Electronic Signature(s) Signed: 09/25/2020 6:01:36 PM By: [name] RN, BSN Entered By: [name] on 09/25/2020 13:17:49 -------------------------------------------------------------------------------- Encounter Discharge Information Details Patient Name: Date of Service: [name] [name] 09/25/2020 12:45 PM Medical Record Number: [id number] Patient Account Number: [id number] Date of Birth/Sex: Treating RN: February 27, 1946 (75 y.o. [name] Primary Care  Ellie Spickler: [name] Other Clinician: Referring Boni Maclellan: Treating Saxon Barich/Extender: Adele Schilder in Treatment: 36 Encounter Discharge Information Items Discharge Condition: Stable Ambulatory Status: Cane Discharge Destination: Home Transportation: Private Auto Accompanied By: self Schedule Follow-up Appointment: Yes Clinical Summary of Care: Patient Declined Electronic Signature(s) Signed: 09/25/2020 6:01:36 PM By: [name] RN, BSN Entered By: [name] on 09/25/2020 13:30:09 -------------------------------------------------------------------------------- Lower Extremity Assessment Details Patient Name: Date of Service: [name] [name] 09/25/2020 12:45 PM Medical Record Number: [id number] Patient Account Number: [id number] Date of Birth/Sex: Treating RN: 14-Aug-1945 (75 y.o. [name] [name] Primary Care Kuulei Kleier: [name] Other Clinician: Referring Albin Duckett: Treating Diyari Cherne/Extender: Landis Martins Weeks in Treatment: 36 Edema Assessment Assessed: [Left: No] [Right: No] Edema: [Left: Ye] [Right: s] Calf Left: Right: Point of Measurement: 29 cm From Medial Instep 38 cm Ankle Left: Right: Point of Measurement: 12 cm From Medial Instep 23.4 cm Vascular Assessment Pulses: Dorsalis Pedis Palpable: [Left:Yes] Electronic Signature(s) Signed: 09/27/2020 4:25:56 PM By: Levan Hurst RN, BSN Entered By: Levan Hurst on 09/25/2020 12:44:00 -------------------------------------------------------------------------------- Multi-Disciplinary Care Plan Details Patient Name: Date of Service: [name] [name] 09/25/2020 12:45 PM Medical Record Number: [id number] Patient Account Number: [id number] Date of Birth/Sex: Treating RN: Nov 26, 1945 (75 y.o. [name] Primary Care Juelz Whittenberg: [name] Other Clinician: Referring Desman Polak: Treating Zlatan Hornback/Extender: Adele Schilder in Treatment:  64 Active Inactive Electronic Signature(s) Signed: 09/25/2020 6:01:36 PM By: [name] RN, BSN Entered By: [name] on 09/25/2020 13:33:21 -------------------------------------------------------------------------------- Pain Assessment Details Patient Name: Date of Service: [name] [name] 09/25/2020 12:45 PM Medical Record Number: [id number] Patient Account Number: [id number] Date of Birth/Sex: Treating RN: 1946/08/08 (75 y.o. [name] [name] Primary Care Anabella Capshaw: [name] Other Clinician: Referring Agripina Guyette: Treating Tala Eber/Extender: Worthy Keeler  [name] Weeks in Treatment: 36 Active Problems Location of Pain Severity and Description of Pain Patient Has Paino No Site Locations Pain Management and Medication Current Pain Management: Electronic Signature(s) Signed: 09/27/2020 4:25:56 PM By: Levan Hurst RN, BSN Entered By: Levan Hurst on 09/25/2020 12:43:55 -------------------------------------------------------------------------------- Patient/Caregiver Education Details Patient Name: Date of Service: [name] [name] 2/16/2022andnbsp12:45 PM Medical Record Number: [id number] Patient Account Number: [id number] Date of Birth/Gender: Treating RN: 1945-09-27 (75 y.o. [name] Primary Care Physician: [name] Other Clinician: Referring Physician: Treating Physician/Extender: Adele Schilder in Treatment: 65 Education Assessment Education Provided To: Patient Education Topics Provided Elevated Blood Sugar/ Impact on Healing: Methods: Explain/Verbal Responses: Reinforcements needed, State content correctly Offloading: Methods: Explain/Verbal Responses: Reinforcements needed, State content correctly Wound/Skin Impairment: Methods: Explain/Verbal Responses: Reinforcements needed, State content correctly Electronic Signature(s) Signed: 09/25/2020 6:01:36 PM By: [name] RN, BSN Entered By:  [name] on 09/25/2020 13:17:13 -------------------------------------------------------------------------------- Wound Assessment Details Patient Name: Date of Service: [name] [name] 09/25/2020 12:45 PM Medical Record Number: [id number] Patient Account Number: [id number] Date of Birth/Sex: Treating RN: 02/13/1946 (75 y.o. [name] [name] Primary Care Alif Petrak: [name] Other Clinician: Referring Zarek Relph: Treating Jermany Rimel/Extender: Landis Martins Weeks in Treatment: 36 Wound Status Wound Number: 1RR Primary Diabetic Wound/Ulcer of the Lower Extremity Etiology: Wound Location: Left, Medial, Plantar Foot Wound Open Wounding Event: Gradually Appeared Status: Date Acquired: 05/11/2019 Comorbid Sleep Apnea, Deep Vein Thrombosis, Hypertension, Colitis, Type Weeks Of Treatment: 36 History: II Diabetes, Gout, Osteoarthritis, Neuropathy Clustered Wound: No Photos Photo Uploaded By: Mikeal Hawthorne on 09/26/2020 11:24:18 Wound Measurements Length: (cm) Width: (cm) Depth: (cm) Area: (cm) Volume: (cm) 0 % Reduction in Area: 100% 0 % Reduction in Volume: 100% 0 Epithelialization: Large (67-100%) 0 Tunneling: No 0 Undermining: No Wound Description Classification: Grade 2 Wound Margin: Distinct, outline attached Exudate Amount: None Present Foul Odor After Cleansing: No Slough/Fibrino No Wound Bed Granulation Amount: None Present (0%) Exposed Structure Necrotic Amount: None Present (0%) Fascia Exposed: No Fat Layer (Subcutaneous Tissue) Exposed: No Tendon Exposed: No Muscle Exposed: No Joint Exposed: No Bone Exposed: No Electronic Signature(s) Signed: 09/27/2020 4:25:56 PM By: Levan Hurst RN, BSN Entered By: Levan Hurst on 09/25/2020 12:44:20 -------------------------------------------------------------------------------- [place] Details Patient Name: Date of Service: [place], [name] 09/25/2020 12:45 PM Medical Record Number:  [id number] Patient Account Number: [id number] Date of Birth/Sex: Treating RN: August 16, 1945 (75 y.o. [name] [name] Primary Care Maricela Schreur: [name] Other Clinician: Referring Alvan Culpepper: Treating Bernetha Anschutz/Extender: Adele Schilder in Treatment: 36 Vital Signs Time Taken: 12:43 Temperature (F): 98.0 Height (in): 66 Pulse (bpm): 91 Weight (lbs): 245 Respiratory Rate (breaths/min): 16 Body Mass Index (BMI): 39.5 Blood Pressure (mmHg): 153/70 Reference Range: 80 - 120 mg / dl Electronic Signature(s) Signed: 09/27/2020 4:25:56 PM By: Levan Hurst RN, BSN Entered By: Levan Hurst on 09/25/2020 12:43:50

## 2020-10-01 ENCOUNTER — Other Ambulatory Visit: Payer: Self-pay | Admitting: Internal Medicine

## 2020-10-02 ENCOUNTER — Other Ambulatory Visit: Payer: Self-pay

## 2020-10-02 ENCOUNTER — Encounter (HOSPITAL_BASED_OUTPATIENT_CLINIC_OR_DEPARTMENT_OTHER): Payer: Medicare Other | Admitting: Physician Assistant

## 2020-10-02 DIAGNOSIS — E11621 Type 2 diabetes mellitus with foot ulcer: Secondary | ICD-10-CM | POA: Diagnosis not present

## 2020-10-02 DIAGNOSIS — E1140 Type 2 diabetes mellitus with diabetic neuropathy, unspecified: Secondary | ICD-10-CM | POA: Diagnosis not present

## 2020-10-02 DIAGNOSIS — L97522 Non-pressure chronic ulcer of other part of left foot with fat layer exposed: Secondary | ICD-10-CM | POA: Diagnosis not present

## 2020-10-02 NOTE — Progress Notes (Addendum)
[name] [name], [name] [name] ([id number]) Visit Report for 10/02/2020 Chief Complaint Document Details Patient Name: Date of Service: [name] [name], [name] E. 10/02/2020 12:30 PM Medical Record Number: [id number] Patient Account Number: [id number] Date of Birth/Sex: Treating RN: December 01, 1945 (75 y.o. [name] Other Clinician: Referring Provider: Treating Provider/Extender: Adele Schilder in Treatment: 37 Information Obtained from: Patient Chief Complaint Left foot ulcer Electronic Signature(s) Signed: 10/02/2020 12:53:17 PM By: Worthy Keeler PA-C Entered By: Worthy Keeler on 10/02/2020 12:53:17 -------------------------------------------------------------------------------- Debridement Details Patient Name: Date of Service: [name] Amato RA E. 10/02/2020 12:30 PM Medical Record Number: [id number] Patient Account Number: [id number] Date of Birth/Sex: Treating RN: 03-Oct-1945 (75 y.o. [name] Other Clinician: Referring Provider: Treating Provider/Extender: Adele Schilder in Treatment: 37 Debridement Performed for Assessment: Wound #1RRR Left,Medial,Plantar Foot Performed By: Physician Worthy Keeler, PA Debridement Type: Debridement Severity of Tissue Pre Debridement: Fat layer exposed Level of Consciousness (Pre-procedure): Awake and Alert Pre-procedure Verification/Time Out Yes - 13:30 Taken: Start Time: 13:31 T Area Debrided (L x W): otal 1.5 (cm) x 1.5 (cm) = 2.25 (cm) Tissue and other material debrided: Viable, Non-Viable, Callus, Subcutaneous, Skin: Epidermis Level: Skin/Subcutaneous Tissue Debridement Description: Excisional Instrument: Curette Bleeding: Minimum Hemostasis Achieved: Pressure End Time: 13:40 Procedural Pain: 0 Post Procedural Pain: 0 Response to Treatment: Procedure was tolerated well Level of Consciousness (Post- Awake and  Alert procedure): Post Debridement Measurements of Total Wound Length: (cm) 0.3 Width: (cm) 0.3 Depth: (cm) 0.2 Volume: (cm) 0.014 Character of Wound/Ulcer Post Debridement: Improved Severity of Tissue Post Debridement: Fat layer exposed Post Procedure Diagnosis Same as Pre-procedure Electronic Signature(s) Signed: 10/02/2020 5:21:35 PM By: [name] RN, BSN Signed: 10/02/2020 6:06:11 PM By: Worthy Keeler PA-C Entered By: [name] on 10/02/2020 13:40:55 -------------------------------------------------------------------------------- HPI Details Patient Name: Date of Service: [name] Guarneri RBA RA E. 10/02/2020 12:30 PM Medical Record Number: [id number] Patient Account Number: [id number] Date of Birth/Sex: Treating RN: 1945-09-17 (75 y.o. [name] Other Clinician: Referring Provider: Treating Provider/Extender: Adele Schilder in Treatment: 37 History of Present Illness HPI Description: 01/17/2020 upon evaluation today patient presents for initial evaluation here in our clinic concerning issues she has been having with a left medial/plantar foot ulcer. This is actually been an issue for her since October 2020. She has been seeing Dr. Doran Durand for quite some time during that course. Fortunately there is no signs of active infection at this time. Or least no mention of this to have seen in general. With that being said unfortunately I do see some signs of erythema noted today that does have me concerned about the possibility of infection at this point in the surrounding area of the wound. There is also a warm to touch at the site which is somewhat concerning. Fortunately there is no evidence of systemic infection which is great news. The patient does have a history of diabetes mellitus type 2, Charcot foot which is what led to the wound, and hypertension. She notes that she was in a cast for some time with Dr. Doran Durand for  about 8 weeks. During that time they were utilizing according to the patient silver nitrate along with a foam doughnut and then Coban to secure in place in the cast in place. With that being said I do not have the actual records to review we are going to try  to get a hold of those unfortunately they would not flow over into care everywhere I did look today. She has been seeing Dr. Doran Durand and his physician assistant Larkin Ina up until the end of May and apparently is still seeing them on a regular basis every 2 weeks roughly. She has also tried Iodosorb without effect here. 01/24/2020 upon evaluation today patient actually appears to be doing quite well with regard to her wounds. She has been tolerating the dressing changes without complication. Fortunately there is no signs of active infection spreading which is good news. Her culture did show signs of Staph aureus I did place her on Augmentin due to the erythema surrounding the wound. With that being said the wound does appear to be doing better she has her longer walking cast/boot and I think that is actually good for her for the time being. I am considering reinitiating total contact cast when she gets back from vacation but next week she will actually be out of town at [employer] she knows not to get in the water but she still obviously is planning to enjoy herself she is going to take it easy on her foot however. 02/07/2020 upon evaluation today patient appears to be doing fairly well in regard to her ulcer on her foot. Fortunately there is no signs of severe infection at this time which is great news and overall very pleased in that regard. With that being said I do think that she could still benefit from a total contact cast. Nonetheless she is using her walking boot which at least provide some protection and that it prevents some of the friction occurring when she is ambulating. 02/14/2020 upon evaluation today patient appears to be doing well with  regard to her foot ulcer. This is actually measuring a little bit smaller yet again this week. Overall very pleased with where things stand and I do not see any signs of active infection at this time which is also good news. Since she is measuring better the patient has wanting to somewhat hold off on proceeding with the total contact cast which I think is reasonable at this point. 02/28/2020 on evaluation today patient appears to be doing well in general in regard to her wound although she has a lot of callus buildup as compared to last time I saw her. This is can require sharp debridement today. I do believe she really needs the total contact cast as well which we have discussed previous. 7/23; patient comes in for a total contact cast change 03/06/2020 on evaluation today patient appears to be doing quite well with regard to her wounds. Fortunately the wound bed is measuring smaller and looking much better there is little callus noted although there is some debridement necessary today. 03/13/2020 on evaluation today patient's wound actually appears to be doing excellent which is great news. With that being said unfortunately she is having some issues currently with her left leg where she does have cellulitis it appears. This may have come from an area that rubbed underneath the cast from last week that we noted we padded that area and it looks to be doing excellent at this point but nonetheless the leg was somewhat painful, swollen, and somewhat erythematous. She also had an elevated white blood cell count of 11.5 based on what I saw on looking at her records from the med center in [address] [facility] from where she was seen yesterday. Unfortunately with [place] having a provider on vacation there was  no one here in the clinic in the afternoon when she called therefore she went to the ER as advised. Subsequently they did not cut off the cast as they did not have anyone from orthopedics there to do so and subsequently  also did not have the ability to do the Doppler for evaluation of DVT They recommended therefore given her dose of Eliquis as well as . Augmentin and sent her home to come see [place] today to have the cast taken off and then she is supposed to go back to have the study for DVT performed they are following. 03/20/2020 upon evaluation today patient appears to be doing well with regard to her foot all things considered we have not been able to use the total contact cast due to the infection that she had last week. She has been on the doxycycline and she had a 10-day supply of that I do believe that is helping and her leg appears to be doing better. With that being said there is fortunately no signs of active infection systemically at this time which is good news. No fevers, chills, nausea, vomiting, or diarrhea. 03/27/2020 upon evaluation today patient appears to be doing well with regard to her foot ulcer. There does not appear to be signs of active infection which is great news. Overall I am very pleased with where things stand at this point. 04/03/2020 upon evaluation today patient appears to be doing pretty well in regard to the overall appearance of her wound. Fortunately there is no signs of active infection at this time which is great news. No fevers, chills, nausea, vomiting, or diarrhea. With that being said she does have some blue-green drainage that actually is a little bit concerning to me for the possibility of Pseudomonas. I discussed that with the patient today. With that being said I do believe that we may be able to manage this however with the topical antibiotic cream as opposed to having to do anything oral especially since she seems to be doing so well with overall appearance of the wound. 04/10/2020 on evaluation today patient appears to be doing about the same roughly in regard to the overall size of her wound. With that being said she fortunately has not shown any signs of worsening  overall which is good news. I do believe that she is doing a great job trying to offload but again she may still do better with the cast. I do not see in the blue-green drainage that we noticed previously I do believe the gentamicin help in this regard. 04/17/2020 on evaluation today patient's wound appears to be doing about the same at this point. There is no significant improvement at this point. No fever chills noted. She is up for put the cast back on the day. That she states in a couple weeks she will need to have this off to go to a workshop. 04/24/2020 on evaluation today patient appears to be doing significantly better in regard to her wound. Fortunately there is no signs of active infection and overall feel like she is making great progress the cast seems to have done excellent for her. 05/01/2020 upon evaluation today patient presents for reevaluation she really does not appear to be doing too badly in regard to the actual wound on the left foot we have been managing. Unfortunately she has bilateral lower extremity edema with blisters between the webspace of her first and second toe on both feet. She has a tremendous amount of edema  in the legs which I think is where this is coming from it does not appear to be infected but nonetheless I do believe this is can be something that needs to be addressed today. Obviously this means we probably will not be putting the cast on at this point. She attributes this to the fact that she was sitting with her feet on the floor much longer during a conference last week she had a great time but unfortunately had a lot of complications as a result. 05/08/2020 upon evaluation today patient appears to be doing somewhat better in regard to her wounds at this time. Fortunately there is no signs of active infection which is great news. With that being said I do believe that the blisters have ruptured and unfortunately did not just reattach I will remove some of  the blistered tissue today. With that being said I do think the wound itself on the plantar aspect of left foot does need to have sharp debridement. 05/15/2020 upon evaluation today patient appears to be doing about the same in regard to her foot ulcer. Unfortunately in the past week her husband had a fall where he sustained a mild traumatic brain bleed. Fortunately he is doing better but being that he was in the hospital she had a walk on this a lot more. The wound does not appear to be any better is also not really appearing to be significantly worse which is good news. There is no signs of active infection at this time. 10/14; patient with a small diabetic wound on the medial part of her left foot. We have been using silver collagen a total contact cast making good progress. I think the patient had a series of blisters on her dorsal foot probably secondary to having her legs recumbent for 3 days while in a conference in [place]. We wrapped her leg last week these are all healed. We did not previously have her in compression on the right leg. 05/29/2020 upon evaluation today patient appears to be doing well with regard to the wound on the plantar aspect of her foot medially. This is measuring smaller and looking much better than last time I saw her. Again when I did see her last was 2 weeks back and the wound was significantly larger. I do believe the cast is helping and I believe the collagen is a good option for her. 06/05/2020 on evaluation today patient appears to be doing well with regard to her foot ulcer this is actually measuring significantly better and overall I feel like she is doing excellent. There is no signs of active infection at this time. 06/12/2020 upon evaluation today patient actually continues to show signs of good improvement which is excellent news. I am extremely pleased with how she seems to be progressing at this point in regard to her wound. There is still some depth to the  wound but I do believe the collagen is helping her quite a bit. 06/19/2020 upon evaluation today patient appears to be doing well with regard to her plantar foot ulcer. She is actually making excellent progress and in fact this appears to be almost completely healed. With that being said I do believe that the patient is going to actually require 1 more week in the cast although after that I am hopeful she will be ready for discharge. 06/26/2020 on evaluation today patient appears to be doing well in regard to her wound currently. Fortunately there is no signs of active infection in  general I feel like she is doing excellent. This appears to be completely healed I think she is ready to come out of the cast. 11/29; patient comes back in the clinic today with a very quick reopening in the exact same area on the medial plantar foot. She had been healed out last time. She went back into some new balance shoes that she got at [employer] with a custom insert. As it turns out this wound also happened when wearing these shoes although there was some modification made I think with the wound initially happened they added some foam around the wound area. This obviously is not going to be sufficient. 07/17/2020 on evaluation today patient appears to be doing well with regard to her wound. Fortunately she seems to be making good progress. Unfortunately she was in the hospital due to a issue with colitis and had just been discharged today in fact. She tells me that her biggest concern here is that a lot of her numbers especially her creatinine were somewhat elevated and problematic. She is can be following up with her provider in order to have a further work-up at this time. With that being said she did need to come back in for a cast change. She did not allow them to remove the cast due to the fact that she did not feel like that was the issue whatsoever and indeed it does not appear that was the case her wound appears  to be doing excellent today. 07/24/2020 upon evaluation today patient appears to be doing well in regard to her foot ulcer. She still has a small opening but this is showing signs of excellent improvement overall but they were very close to complete resolution. No fevers, chills, nausea, vomiting, or diarrhea. 07/31/2020 upon evaluation today patient actually appears to be doing excellent she is actually completely healed this is great news. Fortunately there is no signs of active infection at this time. No fevers, chills, nausea, vomiting, or diarrhea. The patient tells me that she did see Dr. Paulla Dolly in order to get to [place] so that she can have a custom shoe made. She saw him earlier last week. She does have an appointment with Liliane Channel on the sixth I believe on January she tells me Readmission: 08/28/2020 on evaluation today patient appears to be doing poorly in regard to her wound currently. She tells me this has reopened. The good news that she did get measured for and actually her shoes are on the way in from [facility].. With that being said she has been trying to stay off of this is much as possible using her wheelchair around home. Nonetheless has been somewhat difficult. The good news is her shoes should be here next week 09/04/2020 upon evaluation today patient appears to be doing well with regard to her wound. There is a little bit of callus buildup but nothing too significant she does tell me she was very active this week. Fortunately there is no signs of systemic infection at this point. The dorsal foot wound actually appears to be doing better. I think this is healed. 09/11/2020 upon evaluation today patient appears to actually be doing quite well in my opinion based on what I am seeing today. Fortunately there is no signs of infection in fact her mother is certain that this is even open any longer based on what I see. Fortunately I think the patient has been doing everything she can to try  to keep this under control.  09/18/2020 upon evaluation today patient appears to be doing about the same in regard to her foot ulcer. She has been tolerating the dressing changes without complication. Fortunately there is no sign of active infection at this time. No fevers, chills, nausea, vomiting, or diarrhea. She is going require some sharp debridement today. 09/25/2020 upon evaluation today patient appears to be doing well with regard to his wounds she has been tolerating the dressing changes without complication. Her wound appears to be completely healed based on what I am seeing at this point. There does not appear to be any signs of active infection at this time which is great news. No fevers, chills, nausea, vomiting, or diarrhea. She did get the cushion for her foot as well that she ordered to try to help keep pressure off of this area. That looks like it may be very beneficial for her to be honest. 10/02/2020 upon evaluation today patient appears to be doing more poorly in regard to her foot ulcer. Unfortunately this has reopened since we saw her last week. It apparently did not take too long at all for this to happen. She is obviously somewhat disappointed as she was hopeful that that would be time this thing would stay closed. Fortunately there is no evidence of active infection at this point. No fevers, chills, nausea, vomiting, or diarrhea. With that being said I am good have to perform a little bit of sharp debridement clear away some of the debris currently. Including a minimal amount of callus at this time. Electronic Signature(s) Signed: 10/02/2020 1:46:13 PM By: Worthy Keeler PA-C Entered By: Worthy Keeler on 10/02/2020 13:46:13 -------------------------------------------------------------------------------- Physical Exam Details Patient Name: Date of Service: [name] [name], [name] E. 10/02/2020 12:30 PM Medical Record Number: [id number] Patient Account Number: [id number] Date of  Birth/Sex: Treating RN: [birth year] (75 y.o. [name] Other Clinician: Referring Provider: Treating Provider/Extender: Landis Martins Weeks in Treatment: 27 Constitutional Well-nourished and well-hydrated in no acute distress. Respiratory normal breathing without difficulty. Psychiatric this patient is able to make decisions and demonstrates good insight into disease process. Alert and Oriented x 3. pleasant and cooperative. Notes Upon inspection patient's wound bed again did not show any signs of infection though there was a little bit of minimal callus buildup noted at this time I did perform debridement to clear that away currently. Fortunately there is no sign of active infection at this time. No fevers, chills, nausea, vomiting, or diarrhea. Post debridement wound bed appears to be small roughly about 2 mm in size but again it still has not completely healed here. Electronic Signature(s) Signed: 10/02/2020 1:46:27 PM By: Worthy Keeler PA-C Entered By: Worthy Keeler on 10/02/2020 13:46:27 -------------------------------------------------------------------------------- Physician Orders Details Patient Name: Date of Service: [name] [name], [name] RA E. 10/02/2020 12:30 PM Medical Record Number: [id number] Patient Account Number: [id number] Date of Birth/Sex: Treating RN: 21-Jun-1946 (75 y.o. [name] Other Clinician: Referring Provider: Treating Provider/Extender: Adele Schilder in Treatment: [phone] Verbal / Phone Orders: No Diagnosis Coding ICD-10 Coding Code Description E11.621 Type 2 diabetes mellitus with foot ulcer L97.522 Non-pressure chronic ulcer of other part of left foot with fat layer exposed M14.672 Charcot's joint, left ankle and foot E11.40 Type 2 diabetes mellitus with diabetic neuropathy, unspecified Follow-up Appointments Return Appointment in 1  week. Bathing/ Shower/ Hygiene May shower and wash wound with soap and water. - with dressing  changes Off-Loading Open toe surgical shoe to: - left foot Wound Treatment Wound #1RRR - Foot Wound Laterality: Plantar, Left, Medial Prim Dressing: KerraCel Ag Gelling Fiber Dressing, 2x2 in (silver alginate) Every Other Day/30 Days ary Discharge Instructions: Apply silver alginate to wound bed as instructed Secondary Dressing: Woven Gauze Sponges 2x2 in Every Other Day/30 Days Discharge Instructions: Apply over primary dressing as directed. Secondary Dressing: Optifoam Non-Adhesive Dressing, 4x4 in Every Other Day/30 Days Discharge Instructions: Apply over primary dressing cut to make foam donut Secured With: Tegaderm Transparent Film Dressing 4x4.75 (in/in) Every Other Day/30 Days Discharge Instructions: Secure dressing with Tegaderm as directed. Wound #7 - T Second oe Wound Laterality: Left Topical: Triple Antibiotic Ointment, 1 (oz) Tube 1 x Per Day Secondary Dressing: bandaid 1 x Per Day Electronic Signature(s) Signed: 10/02/2020 5:21:35 PM By: [name] RN, BSN Signed: 10/02/2020 6:06:11 PM By: Worthy Keeler PA-C Entered By: [name] on 10/02/2020 13:44:04 -------------------------------------------------------------------------------- Problem List Details Patient Name: Date of Service: [name] Amato RA E. 10/02/2020 12:30 PM Medical Record Number: [id number] Patient Account Number: [id number] Date of Birth/Sex: Treating RN: 03/07/1946 (75 y.o. [name] Other Clinician: Referring Provider: Treating Provider/Extender: Adele Schilder in Treatment: [id number] Active Problems ICD-10 Encounter Code Description Active Date MDM Diagnosis E11.621 Type 2 diabetes mellitus with foot ulcer 01/17/2020 No Yes L97.522 Non-pressure chronic ulcer of other part of left foot with fat layer exposed 01/17/2020 No Yes M14.672  Charcot's joint, left ankle and foot 01/17/2020 No Yes E11.40 Type 2 diabetes mellitus with diabetic neuropathy, unspecified 01/17/2020 No Yes Inactive Problems ICD-10 Code Description Active Date Inactive Date I10 Essential (primary) hypertension 01/17/2020 01/17/2020 Resolved Problems Electronic Signature(s) Signed: 10/02/2020 12:53:06 PM By: Worthy Keeler PA-C Entered By: Worthy Keeler on 10/02/2020 12:53:06 -------------------------------------------------------------------------------- Progress Note Details Patient Name: Date of Service: [name] [name], [name] 10/02/2020 12:30 PM Medical Record Number: [id number] Patient Account Number: [id number] Date of Birth/Sex: Treating RN: Sep 10, 1945 (75 y.o. [name] Other Clinician: Referring Provider: Treating Provider/Extender: Adele Schilder in Treatment: 37 Subjective Chief Complaint Information obtained from Patient Left foot ulcer History of Present Illness (HPI) 01/17/2020 upon evaluation today patient presents for initial evaluation here in our clinic concerning issues she has been having with a left medial/plantar foot ulcer. This is actually been an issue for her since October 2020. She has been seeing Dr. Doran Durand for quite some time during that course. Fortunately there is no signs of active infection at this time. Or least no mention of this to have seen in general. With that being said unfortunately I do see some signs of erythema noted today that does have me concerned about the possibility of infection at this point in the surrounding area of the wound. There is also a warm to touch at the site which is somewhat concerning. Fortunately there is no evidence of systemic infection which is great news. The patient does have a history of diabetes mellitus type 2, Charcot foot which is what led to the wound, and hypertension. She notes that she was in a cast for some time with  Dr. Doran Durand for about 8 weeks. During that time they were utilizing according to the patient silver nitrate along with a foam doughnut and then Coban to secure in place in the cast in place. With that being said I do not have the actual records to review  we are going to try to get a hold of those unfortunately they would not flow over into care everywhere I did look today. She has been seeing Dr. Doran Durand and his physician assistant Larkin Ina up until the end of May and apparently is still seeing them on a regular basis every 2 weeks roughly. She has also tried Iodosorb without effect here. 01/24/2020 upon evaluation today patient actually appears to be doing quite well with regard to her wounds. She has been tolerating the dressing changes without complication. Fortunately there is no signs of active infection spreading which is good news. Her culture did show signs of Staph aureus I did place her on Augmentin due to the erythema surrounding the wound. With that being said the wound does appear to be doing better she has her longer walking cast/boot and I think that is actually good for her for the time being. I am considering reinitiating total contact cast when she gets back from vacation but next week she will actually be out of town at [employer] she knows not to get in the water but she still obviously is planning to enjoy herself she is going to take it easy on her foot however. 02/07/2020 upon evaluation today patient appears to be doing fairly well in regard to her ulcer on her foot. Fortunately there is no signs of severe infection at this time which is great news and overall very pleased in that regard. With that being said I do think that she could still benefit from a total contact cast. Nonetheless she is using her walking boot which at least provide some protection and that it prevents some of the friction occurring when she is ambulating. 02/14/2020 upon evaluation today patient appears to be  doing well with regard to her foot ulcer. This is actually measuring a little bit smaller yet again this week. Overall very pleased with where things stand and I do not see any signs of active infection at this time which is also good news. Since she is measuring better the patient has wanting to somewhat hold off on proceeding with the total contact cast which I think is reasonable at this point. 02/28/2020 on evaluation today patient appears to be doing well in general in regard to her wound although she has a lot of callus buildup as compared to last time I saw her. This is can require sharp debridement today. I do believe she really needs the total contact cast as well which we have discussed previous. 7/23; patient comes in for a total contact cast change 03/06/2020 on evaluation today patient appears to be doing quite well with regard to her wounds. Fortunately the wound bed is measuring smaller and looking much better there is little callus noted although there is some debridement necessary today. 03/13/2020 on evaluation today patient's wound actually appears to be doing excellent which is great news. With that being said unfortunately she is having some issues currently with her left leg where she does have cellulitis it appears. This may have come from an area that rubbed underneath the cast from last week that we noted we padded that area and it looks to be doing excellent at this point but nonetheless the leg was somewhat painful, swollen, and somewhat erythematous. She also had an elevated white blood cell count of 11.5 based on what I saw on looking at her records from the med center in [facility] from where she was seen yesterday. Unfortunately with [place] having a  provider on vacation there was no one here in the clinic in the afternoon when she called therefore she went to the ER as advised. Subsequently they did not cut off the cast as they did not have anyone from orthopedics there to do so  and subsequently also did not have the ability to do the Doppler for evaluation of DVT They recommended therefore given her dose of Eliquis as well as . Augmentin and sent her home to come see [place] today to have the cast taken off and then she is supposed to go back to have the study for DVT performed they are following. 03/20/2020 upon evaluation today patient appears to be doing well with regard to her foot all things considered we have not been able to use the total contact cast due to the infection that she had last week. She has been on the doxycycline and she had a 10-day supply of that I do believe that is helping and her leg appears to be doing better. With that being said there is fortunately no signs of active infection systemically at this time which is good news. No fevers, chills, nausea, vomiting, or diarrhea. 03/27/2020 upon evaluation today patient appears to be doing well with regard to her foot ulcer. There does not appear to be signs of active infection which is great news. Overall I am very pleased with where things stand at this point. 04/03/2020 upon evaluation today patient appears to be doing pretty well in regard to the overall appearance of her wound. Fortunately there is no signs of active infection at this time which is great news. No fevers, chills, nausea, vomiting, or diarrhea. With that being said she does have some blue-green drainage that actually is a little bit concerning to me for the possibility of Pseudomonas. I discussed that with the patient today. With that being said I do believe that we may be able to manage this however with the topical antibiotic cream as opposed to having to do anything oral especially since she seems to be doing so well with overall appearance of the wound. 04/10/2020 on evaluation today patient appears to be doing about the same roughly in regard to the overall size of her wound. With that being said she fortunately has not shown any signs  of worsening overall which is good news. I do believe that she is doing a great job trying to offload but again she may still do better with the cast. I do not see in the blue-green drainage that we noticed previously I do believe the gentamicin help in this regard. 04/17/2020 on evaluation today patient's wound appears to be doing about the same at this point. There is no significant improvement at this point. No fever chills noted. She is up for put the cast back on the day. That she states in a couple weeks she will need to have this off to go to a workshop. 04/24/2020 on evaluation today patient appears to be doing significantly better in regard to her wound. Fortunately there is no signs of active infection and overall feel like she is making great progress the cast seems to have done excellent for her. 05/01/2020 upon evaluation today patient presents for reevaluation she really does not appear to be doing too badly in regard to the actual wound on the left foot we have been managing. Unfortunately she has bilateral lower extremity edema with blisters between the webspace of her first and second toe on both feet. She has  a tremendous amount of edema in the legs which I think is where this is coming from it does not appear to be infected but nonetheless I do believe this is can be something that needs to be addressed today. Obviously this means we probably will not be putting the cast on at this point. She attributes this to the fact that she was sitting with her feet on the floor much longer during a conference last week she had a great time but unfortunately had a lot of complications as a result. 05/08/2020 upon evaluation today patient appears to be doing somewhat better in regard to her wounds at this time. Fortunately there is no signs of active infection which is great news. With that being said I do believe that the blisters have ruptured and unfortunately did not just reattach I will remove  some of the blistered tissue today. With that being said I do think the wound itself on the plantar aspect of left foot does need to have sharp debridement. 05/15/2020 upon evaluation today patient appears to be doing about the same in regard to her foot ulcer. Unfortunately in the past week her husband had a fall where he sustained a mild traumatic brain bleed. Fortunately he is doing better but being that he was in the hospital she had a walk on this a lot more. The wound does not appear to be any better is also not really appearing to be significantly worse which is good news. There is no signs of active infection at this time. 10/14; patient with a small diabetic wound on the medial part of her left foot. We have been using silver collagen a total contact cast making good progress. I think the patient had a series of blisters on her dorsal foot probably secondary to having her legs recumbent for 3 days while in a conference in [place]. We wrapped her leg last week these are all healed. We did not previously have her in compression on the right leg. 05/29/2020 upon evaluation today patient appears to be doing well with regard to the wound on the plantar aspect of her foot medially. This is measuring smaller and looking much better than last time I saw her. Again when I did see her last was 2 weeks back and the wound was significantly larger. I do believe the cast is helping and I believe the collagen is a good option for her. 06/05/2020 on evaluation today patient appears to be doing well with regard to her foot ulcer this is actually measuring significantly better and overall I feel like she is doing excellent. There is no signs of active infection at this time. 06/12/2020 upon evaluation today patient actually continues to show signs of good improvement which is excellent news. I am extremely pleased with how she seems to be progressing at this point in regard to her wound. There is still some  depth to the wound but I do believe the collagen is helping her quite a bit. 06/19/2020 upon evaluation today patient appears to be doing well with regard to her plantar foot ulcer. She is actually making excellent progress and in fact this appears to be almost completely healed. With that being said I do believe that the patient is going to actually require 1 more week in the cast although after that I am hopeful she will be ready for discharge. 06/26/2020 on evaluation today patient appears to be doing well in regard to her wound currently. Fortunately there is no  signs of active infection in general I feel like she is doing excellent. This appears to be completely healed I think she is ready to come out of the cast. 11/29; patient comes back in the clinic today with a very quick reopening in the exact same area on the medial plantar foot. She had been healed out last time. She went back into some new balance shoes that she got at [employer] with a custom insert. As it turns out this wound also happened when wearing these shoes although there was some modification made I think with the wound initially happened they added some foam around the wound area. This obviously is not going to be sufficient. 07/17/2020 on evaluation today patient appears to be doing well with regard to her wound. Fortunately she seems to be making good progress. Unfortunately she was in the hospital due to a issue with colitis and had just been discharged today in fact. She tells me that her biggest concern here is that a lot of her numbers especially her creatinine were somewhat elevated and problematic. She is can be following up with her provider in order to have a further work-up at this time. With that being said she did need to come back in for a cast change. She did not allow them to remove the cast due to the fact that she did not feel like that was the issue whatsoever and indeed it does not appear that was the case her  wound appears to be doing excellent today. 07/24/2020 upon evaluation today patient appears to be doing well in regard to her foot ulcer. She still has a small opening but this is showing signs of excellent improvement overall but they were very close to complete resolution. No fevers, chills, nausea, vomiting, or diarrhea. 07/31/2020 upon evaluation today patient actually appears to be doing excellent she is actually completely healed this is great news. Fortunately there is no signs of active infection at this time. No fevers, chills, nausea, vomiting, or diarrhea. The patient tells me that she did see Dr. Paulla Dolly in order to get to [place] so that she can have a custom shoe made. She saw him earlier last week. She does have an appointment with Liliane Channel on the sixth I believe on January she tells me Readmission: 08/28/2020 on evaluation today patient appears to be doing poorly in regard to her wound currently. She tells me this has reopened. The good news that she did get measured for and actually her shoes are on the way in from [facility].. With that being said she has been trying to stay off of this is much as possible using her wheelchair around home. Nonetheless has been somewhat difficult. The good news is her shoes should be here next week 09/04/2020 upon evaluation today patient appears to be doing well with regard to her wound. There is a little bit of callus buildup but nothing too significant she does tell me she was very active this week. Fortunately there is no signs of systemic infection at this point. The dorsal foot wound actually appears to be doing better. I think this is healed. 09/11/2020 upon evaluation today patient appears to actually be doing quite well in my opinion based on what I am seeing today. Fortunately there is no signs of infection in fact her mother is certain that this is even open any longer based on what I see. Fortunately I think the patient has been doing everything  she can to try  to keep this under control. 09/18/2020 upon evaluation today patient appears to be doing about the same in regard to her foot ulcer. She has been tolerating the dressing changes without complication. Fortunately there is no sign of active infection at this time. No fevers, chills, nausea, vomiting, or diarrhea. She is going require some sharp debridement today. 09/25/2020 upon evaluation today patient appears to be doing well with regard to his wounds she has been tolerating the dressing changes without complication. Her wound appears to be completely healed based on what I am seeing at this point. There does not appear to be any signs of active infection at this time which is great news. No fevers, chills, nausea, vomiting, or diarrhea. She did get the cushion for her foot as well that she ordered to try to help keep pressure off of this area. That looks like it may be very beneficial for her to be honest. 10/02/2020 upon evaluation today patient appears to be doing more poorly in regard to her foot ulcer. Unfortunately this has reopened since we saw her last week. It apparently did not take too long at all for this to happen. She is obviously somewhat disappointed as she was hopeful that that would be time this thing would stay closed. Fortunately there is no evidence of active infection at this point. No fevers, chills, nausea, vomiting, or diarrhea. With that being said I am good have to perform a little bit of sharp debridement clear away some of the debris currently. Including a minimal amount of callus at this time. Objective Constitutional Well-nourished and well-hydrated in no acute distress. Vitals Time Taken: 12:47 PM, Height: 66 in, Weight: 245 lbs, BMI: 39.5, Temperature: 98.8 F, Pulse: 71 bpm, Respiratory Rate: 16 breaths/min, Blood Pressure: 127/74 mmHg. General Notes: Per patient did not check blood glucose today. Respiratory normal breathing without  difficulty. Psychiatric this patient is able to make decisions and demonstrates good insight into disease process. Alert and Oriented x 3. pleasant and cooperative. General Notes: Upon inspection patient's wound bed again did not show any signs of infection though there was a little bit of minimal callus buildup noted at this time I did perform debridement to clear that away currently. Fortunately there is no sign of active infection at this time. No fevers, chills, nausea, vomiting, or diarrhea. Post debridement wound bed appears to be small roughly about 2 mm in size but again it still has not completely healed here. Integumentary (Hair, Skin) Wound #1RRR status is Open. Original cause of wound was Gradually Appeared. The date acquired was: 05/11/2019. The wound has been in treatment 37 weeks. The wound is located on the [place]. The wound measures 0.2cm length x 0.2cm width x 0.1cm depth; 0.031cm^2 area and 0.003cm^3 volume. There is Fat Layer (Subcutaneous Tissue) exposed. There is no tunneling or undermining noted. There is a small amount of serosanguineous drainage noted. The wound margin is distinct with the outline attached to the wound base. There is large (67-100%) pink granulation within the wound bed. There is no necrotic tissue within the wound bed. General Notes: callous periwound. Wound #7 status is Open. Original cause of wound was Trauma. The date acquired was: 09/30/2020. The wound is located on the Left T Second. The wound oe measures 0.5cm length x 0.5cm width x 0.1cm depth; 0.196cm^2 area and 0.02cm^3 volume. There is Fat Layer (Subcutaneous Tissue) exposed. There is no tunneling or undermining noted. There is a small amount of serosanguineous drainage noted. The wound  margin is distinct with the outline attached to the wound base. There is medium (34-66%) pink granulation within the wound bed. There is a medium (34-66%) amount of necrotic tissue within the wound  bed including Adherent Slough. Assessment Active Problems ICD-10 Type 2 diabetes mellitus with foot ulcer Non-pressure chronic ulcer of other part of left foot with fat layer exposed Charcot's joint, left ankle and foot Type 2 diabetes mellitus with diabetic neuropathy, unspecified Procedures Wound #1RRR Pre-procedure diagnosis of Wound #1RRR is a Diabetic Wound/Ulcer of the Lower Extremity located on the Left,Medial,Plantar Foot .Severity of Tissue Pre Debridement is: Fat layer exposed. There was a Excisional Skin/Subcutaneous Tissue Debridement with a total area of 2.25 sq cm performed by Worthy Keeler, PA. With the following instrument(s): Curette to remove Viable and Non-Viable tissue/material. Material removed includes Callus, Subcutaneous Tissue, and Skin: Epidermis. No specimens were taken. A time out was conducted at 13:30, prior to the start of the procedure. A Minimum amount of bleeding was controlled with Pressure. The procedure was tolerated well with a pain level of 0 throughout and a pain level of 0 following the procedure. Post Debridement Measurements: 0.3cm length x 0.3cm width x 0.2cm depth; 0.014cm^3 volume. Character of Wound/Ulcer Post Debridement is improved. Severity of Tissue Post Debridement is: Fat layer exposed. Post procedure Diagnosis Wound #1RRR: Same as Pre-Procedure Plan Follow-up Appointments: Return Appointment in 1 week. Bathing/ Shower/ Hygiene: May shower and wash wound with soap and water. - with dressing changes Off-Loading: Open toe surgical shoe to: - left foot WOUND #1RRR: - Foot Wound Laterality: Plantar, Left, Medial Prim Dressing: KerraCel Ag Gelling Fiber Dressing, 2x2 in (silver alginate) Every Other Day/30 Days ary Discharge Instructions: Apply silver alginate to wound bed as instructed Secondary Dressing: Woven Gauze Sponges 2x2 in Every Other Day/30 Days Discharge Instructions: Apply over primary dressing as directed. Secondary  Dressing: Optifoam Non-Adhesive Dressing, 4x4 in Every Other Day/30 Days Discharge Instructions: Apply over primary dressing cut to make foam donut Secured With: T egaderm Transparent Film Dressing 4x4.75 (in/in) Every Other Day/30 Days Discharge Instructions: Secure dressing with T egaderm as directed. WOUND #7: - T Second Wound Laterality: Left oe Topical: Triple Antibiotic Ointment, 1 (oz) Tube 1 x Per Day/ Secondary Dressing: bandaid 1 x Per Day/ 1. Would recommend currently that we going to continue with wound care measures as before and the patient is in agreement with plan this includes the use of a silver alginate dressing which was doing well previous. 2. I am also can recommend we have the patient currently cover this with a piece of foam and then subsequently a T egaderm dressing in order to be more of a waterproof type deal here. 3. I am also can recommend the patient continue to try as much as possible to stay off of her foot. She has been doing so. Obviously this has become a frustration both in the fact that obviously she wants to be doing more as well as and the fact that it also puts a lot of strain and stress on her husband unfortunately. Nonetheless I think it is beneficial for her at this point I think it still the optimal thing to do. We will see patient back for reevaluation in 1 week here in the clinic. If anything worsens or changes patient will contact our office for additional recommendations. Electronic Signature(s) Signed: 10/02/2020 1:48:44 PM By: Worthy Keeler PA-C Entered By: Worthy Keeler on 10/02/2020 13:48:44 -------------------------------------------------------------------------------- SuperBill Details Patient Name: Date  of Service: [name] [name], [name] E. 10/02/2020 Medical Record Number: [id number] Patient Account Number: [id number] Date of Birth/Sex: Treating RN: [birth year] (75 y.o. [name], [name] Primary Care Provider: [name] Other  Clinician: Referring Provider: Treating Provider/Extender: Adele Schilder in Treatment: 37 Diagnosis Coding ICD-10 Codes Code Description E11.621 Type 2 diabetes mellitus with foot ulcer L97.522 Non-pressure chronic ulcer of other part of left foot with fat layer exposed M14.672 Charcot's joint, left ankle and foot E11.40 Type 2 diabetes mellitus with diabetic neuropathy, unspecified Facility Procedures CPT4 Code: [id number] Description: 59563 - DEB SUBQ TISSUE 20 SQ CM/< ICD-10 Diagnosis Description L97.522 Non-pressure chronic ulcer of other part of left foot with fat layer exposed Modifier: Quantity: 1 Physician Procedures Electronic Signature(s) Signed: 10/02/2020 1:51:52 PM By: Worthy Keeler PA-C Entered By: Worthy Keeler on 10/02/2020 13:51:51

## 2020-10-02 NOTE — Progress Notes (Signed)
PILAR, WESTERGAARD ([id number]) Visit Report for 10/02/2020 Arrival Information Details Patient Name: Date of Service: JANAIA, KOZEL RA E. 10/02/2020 12:30 PM Medical Record Number: [id number] Patient Account Number: [id number] Date of Birth/Sex: Treating RN: Jul 11, 1946 (75 y.o. [name] Primary Care Vasilios Ottaway: [name] Other Clinician: Referring General Wearing: Treating Shara Hartis/Extender: Adele Schilder in Treatment: 50 Visit Information History Since Last Visit Added or deleted any medications: No Patient Arrived: Wheel Chair Any new allergies or adverse reactions: No Arrival Time: 12:47 Had a fall or experienced change in No Accompanied By: self activities of daily living that may affect Transfer Assistance: None risk of falls: Patient Identification Verified: Yes Signs or symptoms of abuse/neglect since No Secondary Verification Process Completed: Yes last visito Patient Requires Transmission-Based Precautions: No Hospitalized since last visit: No Patient Has Alerts: No Implantable device outside of the clinic No excluding cellular tissue based products placed in the center since last visit: Has Dressing in Place as Prescribed: Yes Has Footwear/Offloading in Place as Yes Prescribed: Left: Surgical Shoe with Pressure Relief Insole Pain Present Now: No Electronic Signature(s) Signed: 10/02/2020 5:17:36 PM By: Deon Pilling Entered By: Deon Pilling on 10/02/2020 12:47:48 -------------------------------------------------------------------------------- Encounter Discharge Information Details Patient Name: Date of Service: [name] 10/02/2020 12:30 PM Medical Record Number: [id number] Patient Account Number: [id number] Date of Birth/Sex: Treating RN: 11/16/1945 (75 y.o. [name], Lauren Primary Care Wilberta Dorvil: [name] Other Clinician: Referring Mavery Milling: Treating Jerimah Witucki/Extender: Adele Schilder in Treatment:  37 Encounter Discharge Information Items Post Procedure Vitals Discharge Condition: Stable Temperature (F): 98.4 Ambulatory Status: Ambulatory Pulse (bpm): 74 Discharge Destination: Home Respiratory Rate (breaths/min): 16 Transportation: Private Auto Blood Pressure (mmHg): 127/72 Accompanied By: self Schedule Follow-up Appointment: Yes Clinical Summary of Care: Patient Declined Electronic Signature(s) Signed: 10/02/2020 5:04:01 PM By: Rhae Hammock RN Entered By: Rhae Hammock on 10/02/2020 13:48:59 -------------------------------------------------------------------------------- Lower Extremity Assessment Details Patient Name: Date of Service: [name] 10/02/2020 12:30 PM Medical Record Number: [id number] Patient Account Number: [id number] Date of Birth/Sex: Treating RN: 1946/04/28 (75 y.o. [name] Primary Care Poppy Mcafee: [name] Other Clinician: Referring Lolah Coghlan: Treating Kysean Sweet/Extender: Landis Martins Weeks in Treatment: 37 Edema Assessment Assessed: [Left: Yes] [Right: No] Edema: [Left: Ye] [Right: s] Calf Left: Right: Point of Measurement: 29 cm From Medial Instep 38.5 cm Ankle Left: Right: Point of Measurement: 12 cm From Medial Instep 23.5 cm Vascular Assessment Pulses: Dorsalis Pedis Palpable: [Left:Yes] Electronic Signature(s) Signed: 10/02/2020 5:17:36 PM By: Deon Pilling Entered By: Deon Pilling on 10/02/2020 12:48:40 -------------------------------------------------------------------------------- Multi-Disciplinary Care Plan Details Patient Name: Date of Service: [name] 10/02/2020 12:30 PM Medical Record Number: [id number] Patient Account Number: [id number] Date of Birth/Sex: Treating RN: 12/05/1945 (75 y.o. [name] Primary Care Omid Deardorff: [name] Other Clinician: Referring Aeliana Spates: Treating Kimara Bencomo/Extender: Adele Schilder in Treatment:  [place] reviewed with physician Active Inactive Nutrition Nursing Diagnoses: Impaired glucose control: actual or potential Potential for alteratiion in Nutrition/Potential for imbalanced nutrition Goals: Patient/caregiver verbalizes understanding of need to maintain therapeutic glucose control per primary care physician Date Initiated: 01/17/2020 Date Inactivated: 02/14/2020 Target Resolution Date: 02/14/2020 Goal Status: Met Patient/caregiver will maintain therapeutic glucose control Date Initiated: 01/17/2020 Target Resolution Date: 10/23/2020 Goal Status: Active Interventions: Assess HgA1c results as ordered upon admission and as needed Assess patient nutrition upon admission and as needed per policy Provide education on elevated blood sugars and impact  on wound healing Treatment Activities: Patient referred to Primary Care Physician for further nutritional evaluation : 01/17/2020 Notes: Wound/Skin Impairment Nursing Diagnoses: Impaired tissue integrity Knowledge deficit related to ulceration/compromised skin integrity Goals: Patient/caregiver will verbalize understanding of skin care regimen Date Initiated: 01/17/2020 Target Resolution Date: 10/23/2020 Goal Status: Active Ulcer/skin breakdown will have a volume reduction of 30% by week 4 Date Initiated: 01/17/2020 Date Inactivated: 02/14/2020 Target Resolution Date: 02/14/2020 Goal Status: Met Ulcer/skin breakdown will have a volume reduction of 50% by week 8 Date Initiated: 02/14/2020 Date Inactivated: 03/13/2020 Target Resolution Date: 03/13/2020 Goal Status: Met Ulcer/skin breakdown will have a volume reduction of 80% by week 12 Date Initiated: 03/13/2020 Date Inactivated: 04/10/2020 Target Resolution Date: 04/10/2020 Goal Status: Unmet Unmet Reason: infection Interventions: Assess patient/caregiver ability to obtain necessary supplies Assess patient/caregiver ability to perform ulcer/skin care regimen upon admission  and as needed Assess ulceration(s) every visit Provide education on ulcer and skin care Treatment Activities: Skin care regimen initiated : 01/17/2020 Topical wound management initiated : 01/17/2020 Notes: Electronic Signature(s) Signed: 10/02/2020 5:21:35 PM By: [name] RN, BSN Entered By: [name] on 10/02/2020 13:12:19 -------------------------------------------------------------------------------- Pain Assessment Details Patient Name: Date of Service: [name] 10/02/2020 12:30 PM Medical Record Number: [id number] Patient Account Number: [id number] Date of Birth/Sex: Treating RN: 02-Feb-1946 (75 y.o. [name] Primary Care Glendi Mohiuddin: [name] Other Clinician: Referring Chidera Thivierge: Treating Patton Rabinovich/Extender: Adele Schilder in Treatment: 37 Active Problems Location of Pain Severity and Description of Pain Patient Has Paino No Site Locations Rate the pain. Rate the pain. Current Pain Level: 0 Pain Management and Medication Current Pain Management: Medication: No Cold Application: No Rest: No Massage: No Activity: No T.E.N.S.: No Heat Application: No Leg drop or elevation: No Is the Current Pain Management Adequate: Adequate How does your wound impact your activities of daily livingo Sleep: No Bathing: No Appetite: No Relationship With Others: No Bladder Continence: No Emotions: No Bowel Continence: No Work: No Toileting: No Drive: No Dressing: No Hobbies: No Electronic Signature(s) Signed: 10/02/2020 5:17:36 PM By: Deon Pilling Entered By: Deon Pilling on 10/02/2020 12:48:27 -------------------------------------------------------------------------------- Patient/Caregiver Education Details Patient Name: Date of Service: [name] 2/23/2022andnbsp12:30 PM Medical Record Number: [id number] Patient Account Number: [id number] Date of Birth/Gender: Treating RN: [birth year] (75 y.o. [name] Primary Care Physician: [name] Other Clinician: Referring Physician: Treating Physician/Extender: Adele Schilder in Treatment: 37 Education Assessment Education Provided To: Patient Education Topics Provided Elevated Blood Sugar/ Impact on Healing: Methods: Explain/Verbal Responses: Reinforcements needed, State content correctly Offloading: Methods: Explain/Verbal Responses: Reinforcements needed, State content correctly Wound/Skin Impairment: Methods: Explain/Verbal Responses: Reinforcements needed, State content correctly Electronic Signature(s) Signed: 10/02/2020 5:21:35 PM By: [name] RN, BSN Entered By: [name] on 10/02/2020 13:12:46 -------------------------------------------------------------------------------- Wound Assessment Details Patient Name: Date of Service: [name] 10/02/2020 12:30 PM Medical Record Number: [id number] Patient Account Number: [id number] Date of Birth/Sex: Treating RN: 1946/01/26 (75 y.o. Helene Shoe, Meta.Reding Primary Care Kylea Berrong: [name] Other Clinician: Referring Shaleigh Laubscher: Treating Siaosi Alter/Extender: Landis Martins Weeks in Treatment: 37 Wound Status Wound Number: [id number] Primary Diabetic Wound/Ulcer of the Lower Extremity Etiology: Wound Location: Left, Medial, Plantar Foot Wound Open Wounding Event: Gradually Appeared Status: Date Acquired: 05/11/2019 Comorbid Sleep Apnea, Deep Vein Thrombosis, Hypertension, Colitis, Type Weeks Of Treatment: 37 History: II Diabetes, Gout, Osteoarthritis, Neuropathy Clustered Wound: No Wound Measurements Length: (cm) 0.2 Width: (cm) 0.2 Depth: (cm) 0.1  Area: (cm) 0.031 Volume: (cm) 0.003 % Reduction in Area: 97% % Reduction in Volume: 99.3% Epithelialization: Large (67-100%) Tunneling: No Undermining: No Wound Description Classification: Grade 2 Wound Margin: Distinct, outline attached Exudate Amount: Small Exudate  Type: Serosanguineous Exudate Color: red, brown Foul Odor After Cleansing: No Slough/Fibrino No Wound Bed Granulation Amount: Large (67-100%) Exposed Structure Granulation Quality: Pink Fascia Exposed: No Necrotic Amount: None Present (0%) Fat Layer (Subcutaneous Tissue) Exposed: Yes Tendon Exposed: No Muscle Exposed: No Joint Exposed: No Bone Exposed: No Assessment Notes callous periwound. Treatment Notes Wound #1RRR (Foot) Wound Laterality: Plantar, Left, Medial Cleanser Peri-Wound Care Topical Primary Dressing KerraCel Ag Gelling Fiber Dressing, 2x2 in (silver alginate) Discharge Instruction: Apply silver alginate to wound bed as instructed Secondary Dressing Woven Gauze Sponges 2x2 in Discharge Instruction: Apply over primary dressing as directed. Optifoam Non-Adhesive Dressing, 4x4 in Discharge Instruction: Apply over primary dressing cut to make foam donut Secured With Tegaderm Transparent Film Dressing 4x4.75 (in/in) Discharge Instruction: Secure dressing with Tegaderm as directed. Compression Wrap Compression Stockings Add-Ons Electronic Signature(s) Signed: 10/02/2020 5:17:36 PM By: Deon Pilling Entered By: Deon Pilling on 10/02/2020 12:50:14 -------------------------------------------------------------------------------- Wound Assessment Details Patient Name: Date of Service: JUDAH, CHEVERE RA E. 10/02/2020 12:30 PM Medical Record Number: [id number] Patient Account Number: [id number] Date of Birth/Sex: Treating RN: 1945/10/12 (75 y.o. Helene Shoe, Tammi Klippel Primary Care Valia Wingard: [name] Other Clinician: Referring Camy Leder: Treating Hedwig Mcfall/Extender: Landis Martins Weeks in Treatment: 37 Wound Status Wound Number: 7 Primary Diabetic Wound/Ulcer of the Lower Extremity Etiology: Wound Location: Left T Second oe Wound Open Wounding Event: Trauma Status: Date Acquired: 09/30/2020 Comorbid Sleep Apnea, Deep Vein Thrombosis, Hypertension,  Colitis, Type Weeks Of Treatment: 0 History: II Diabetes, Gout, Osteoarthritis, Neuropathy Clustered Wound: No Wound Measurements Length: (cm) 0.5 Width: (cm) 0.5 Depth: (cm) 0.1 Area: (cm) 0.196 Volume: (cm) 0.02 % Reduction in Area: % Reduction in Volume: Epithelialization: None Tunneling: No Undermining: No Wound Description Classification: Grade 1 Wound Margin: Distinct, outline attached Exudate Amount: Small Exudate Type: Serosanguineous Exudate Color: red, brown Foul Odor After Cleansing: No Slough/Fibrino Yes Wound Bed Granulation Amount: Medium (34-66%) Exposed Structure Granulation Quality: Pink Fascia Exposed: No Necrotic Amount: Medium (34-66%) Fat Layer (Subcutaneous Tissue) Exposed: Yes Necrotic Quality: Adherent Slough Tendon Exposed: No Muscle Exposed: No Joint Exposed: No Bone Exposed: No Treatment Notes Wound #7 (Toe Second) Wound Laterality: Left Cleanser Peri-Wound Care Topical Triple Antibiotic Ointment, 1 (oz) Tube Primary Dressing Secondary Dressing bandaid Secured With Compression Wrap Compression Stockings Add-Ons Electronic Signature(s) Signed: 10/02/2020 5:17:36 PM By: Deon Pilling Entered By: Deon Pilling on 10/02/2020 12:56:31 -------------------------------------------------------------------------------- Vitals Details Patient Name: Date of Service: [name] 10/02/2020 12:30 PM Medical Record Number: [id number] Patient Account Number: [id number] Date of Birth/Sex: Treating RN: Dec 21, 1945 (75 y.o. Helene Shoe, Tammi Klippel Primary Care Anikah Hogge: [name] Other Clinician: Referring Shannette Tabares: Treating Kaiyu Mirabal/Extender: Adele Schilder in Treatment: 37 Vital Signs Time Taken: 12:47 Temperature (F): 98.8 Height (in): 66 Pulse (bpm): 71 Weight (lbs): 245 Respiratory Rate (breaths/min): 16 Body Mass Index (BMI): 39.5 Blood Pressure (mmHg): 127/74 Reference Range: 80 - 120 mg / dl Notes Per  patient did not check blood glucose today. Electronic Signature(s) Signed: 10/02/2020 5:17:36 PM By: Deon Pilling Entered By: Deon Pilling on 10/02/2020 12:48:20

## 2020-10-09 ENCOUNTER — Encounter (HOSPITAL_BASED_OUTPATIENT_CLINIC_OR_DEPARTMENT_OTHER): Payer: Medicare Other | Admitting: Physician Assistant

## 2020-10-11 ENCOUNTER — Ambulatory Visit: Payer: Medicare Other | Admitting: Internal Medicine

## 2020-10-14 DIAGNOSIS — L4059 Other psoriatic arthropathy: Secondary | ICD-10-CM | POA: Diagnosis not present

## 2020-10-14 DIAGNOSIS — L299 Pruritus, unspecified: Secondary | ICD-10-CM | POA: Diagnosis not present

## 2020-10-14 DIAGNOSIS — E669 Obesity, unspecified: Secondary | ICD-10-CM | POA: Diagnosis not present

## 2020-10-14 DIAGNOSIS — K5289 Other specified noninfective gastroenteritis and colitis: Secondary | ICD-10-CM | POA: Diagnosis not present

## 2020-10-14 DIAGNOSIS — Z6839 Body mass index (BMI) 39.0-39.9, adult: Secondary | ICD-10-CM | POA: Diagnosis not present

## 2020-10-14 DIAGNOSIS — L408 Other psoriasis: Secondary | ICD-10-CM | POA: Diagnosis not present

## 2020-10-14 DIAGNOSIS — M2550 Pain in unspecified joint: Secondary | ICD-10-CM | POA: Diagnosis not present

## 2020-10-14 DIAGNOSIS — Z79899 Other long term (current) drug therapy: Secondary | ICD-10-CM | POA: Diagnosis not present

## 2020-10-14 DIAGNOSIS — M1009 Idiopathic gout, multiple sites: Secondary | ICD-10-CM | POA: Diagnosis not present

## 2020-10-15 ENCOUNTER — Other Ambulatory Visit: Payer: Self-pay | Admitting: Internal Medicine

## 2020-10-15 NOTE — Telephone Encounter (Signed)
Patient has called stating she is completely out of Potassium.  I can schedule her an appointment this afternoon if need be for the refill.

## 2020-10-16 ENCOUNTER — Encounter (HOSPITAL_BASED_OUTPATIENT_CLINIC_OR_DEPARTMENT_OTHER): Payer: Medicare Other | Attending: Physician Assistant | Admitting: Physician Assistant

## 2020-10-16 ENCOUNTER — Other Ambulatory Visit: Payer: Self-pay

## 2020-10-16 DIAGNOSIS — I10 Essential (primary) hypertension: Secondary | ICD-10-CM | POA: Diagnosis not present

## 2020-10-16 DIAGNOSIS — L97522 Non-pressure chronic ulcer of other part of left foot with fat layer exposed: Secondary | ICD-10-CM | POA: Diagnosis not present

## 2020-10-16 DIAGNOSIS — E1140 Type 2 diabetes mellitus with diabetic neuropathy, unspecified: Secondary | ICD-10-CM | POA: Diagnosis not present

## 2020-10-16 DIAGNOSIS — E11621 Type 2 diabetes mellitus with foot ulcer: Secondary | ICD-10-CM | POA: Diagnosis not present

## 2020-10-17 ENCOUNTER — Ambulatory Visit (INDEPENDENT_AMBULATORY_CARE_PROVIDER_SITE_OTHER): Payer: Medicare Other | Admitting: Podiatry

## 2020-10-17 ENCOUNTER — Other Ambulatory Visit: Payer: Self-pay

## 2020-10-17 DIAGNOSIS — E1142 Type 2 diabetes mellitus with diabetic polyneuropathy: Secondary | ICD-10-CM

## 2020-10-17 DIAGNOSIS — L4059 Other psoriatic arthropathy: Secondary | ICD-10-CM | POA: Diagnosis not present

## 2020-10-17 DIAGNOSIS — E11621 Type 2 diabetes mellitus with foot ulcer: Secondary | ICD-10-CM | POA: Diagnosis not present

## 2020-10-17 DIAGNOSIS — M14672 Charcot's joint, left ankle and foot: Secondary | ICD-10-CM

## 2020-10-17 DIAGNOSIS — M898X7 Other specified disorders of bone, ankle and foot: Secondary | ICD-10-CM | POA: Diagnosis not present

## 2020-10-17 NOTE — Progress Notes (Addendum)
[name], [name] ([id number]) Visit Report for 10/16/2020 Chief Complaint Document Details Patient Name: Date of Service: [name] [name] [name], [name] [name] 10/16/2020 1:45 PM Medical Record Number: [id number] Patient Account Number: [id number] Date of Birth/Sex: Treating RN: 1946-05-20 (75 y.o. [name] Other Clinician: Referring Provider: Treating Provider/Extender: Adele Schilder in Treatment: 39 Information Obtained from: Patient Chief Complaint Left foot ulcer Electronic Signature(s) Signed: 10/16/2020 2:03:50 PM By: Worthy Keeler PA-C Entered By: Worthy Keeler on 10/16/2020 14:03:50 -------------------------------------------------------------------------------- Debridement Details Patient Name: Date of Service: [name] [name] [name], [name] [name] RBA RA E. 10/16/2020 1:45 PM Medical Record Number: [id number] Patient Account Number: [id number] Date of Birth/Sex: Treating RN: 1946-01-01 (75 y.o. [name] Other Clinician: Referring Provider: Treating Provider/Extender: Adele Schilder in Treatment: 39 Debridement Performed for Assessment: Wound #1RRR Left,Medial,Plantar Foot Performed By: Physician Worthy Keeler, PA Debridement Type: Debridement Severity of Tissue Pre Debridement: Fat layer exposed Level of Consciousness (Pre-procedure): Awake and Alert Pre-procedure Verification/Time Out Yes - 15:00 Taken: Start Time: 15:01 Pain Control: Lidocaine 4% T opical Solution T Area Debrided (L x W): otal 3 (cm) x 2.8 (cm) = 8.4 (cm) Tissue and other material debrided: Viable, Non-Viable, Callus, Slough, Subcutaneous, Slough Level: Skin/Subcutaneous Tissue Debridement Description: Excisional Instrument: Curette Bleeding: Minimum Hemostasis Achieved: Pressure End Time: 15:05 Procedural Pain: 0 Post Procedural Pain: 0 Response to Treatment: Procedure was tolerated well Level of  Consciousness (Post- Awake and Alert procedure): Post Debridement Measurements of Total Wound Length: (cm) 0.7 Width: (cm) 0.4 Depth: (cm) 0.3 Volume: (cm) 0.066 Character of Wound/Ulcer Post Debridement: Improved Severity of Tissue Post Debridement: Fat layer exposed Post Procedure Diagnosis Same as Pre-procedure Electronic Signature(s) Signed: 10/16/2020 6:04:37 PM By: Worthy Keeler PA-C Signed: 10/18/2020 6:00:02 PM By: [name] RN, BSN Entered By: [name] on 10/16/2020 15:08:20 -------------------------------------------------------------------------------- HPI Details Patient Name: Date of Service: [name] [name] [name], [name] [name] RBA RA E. 10/16/2020 1:45 PM Medical Record Number: [id number] Patient Account Number: [id number] Date of Birth/Sex: Treating RN: [birth year] (75 y.o. [name] Other Clinician: Referring Provider: Treating Provider/Extender: Adele Schilder in Treatment: 50 History of Present Illness HPI Description: 01/17/2020 upon evaluation today patient presents for initial evaluation here in our clinic concerning issues she has been having with a left medial/plantar foot ulcer. This is actually been an issue for her since October 2020. She has been seeing Dr. Doran Durand for quite some time during that course. Fortunately there is no signs of active infection at this time. Or least no mention of this to have seen in general. With that being said unfortunately I do see some signs of erythema noted today that does have me concerned about the possibility of infection at this point in the surrounding area of the wound. There is also a warm to touch at the site which is somewhat concerning. Fortunately there is no evidence of systemic infection which is great news. The patient does have a history of diabetes mellitus type 2, Charcot foot which is what led to the wound, and hypertension. She notes that she was in a cast for  some time with Dr. Doran Durand for about 8 weeks. During that time they were utilizing according to the patient silver nitrate along with a foam doughnut and then Coban to secure in place in the cast in place. With that being said I do not have the actual records  to review we are going to try to get a hold of those unfortunately they would not flow over into care everywhere I did look today. She has been seeing Dr. Doran Durand and his physician assistant Larkin Ina up until the end of May and apparently is still seeing them on a regular basis every 2 weeks roughly. She has also tried Iodosorb without effect here. 01/24/2020 upon evaluation today patient actually appears to be doing quite well with regard to her wounds. She has been tolerating the dressing changes without complication. Fortunately there is no signs of active infection spreading which is good news. Her culture did show signs of Staph aureus I did place her on Augmentin due to the erythema surrounding the wound. With that being said the wound does appear to be doing better she has her longer walking cast/boot and I think that is actually good for her for the time being. I am considering reinitiating total contact cast when she gets back from vacation but next week she will actually be out of town at [employer] she knows not to get in the water but she still obviously is planning to enjoy herself she is going to take it easy on her foot however. 02/07/2020 upon evaluation today patient appears to be doing fairly well in regard to her ulcer on her foot. Fortunately there is no signs of severe infection at this time which is great news and overall very pleased in that regard. With that being said I do think that she could still benefit from a total contact cast. Nonetheless she is using her walking boot which at least provide some protection and that it prevents some of the friction occurring when she is ambulating. 02/14/2020 upon evaluation today patient  appears to be doing well with regard to her foot ulcer. This is actually measuring a little bit smaller yet again this week. Overall very pleased with where things stand and I do not see any signs of active infection at this time which is also good news. Since she is measuring better the patient has wanting to somewhat hold off on proceeding with the total contact cast which I think is reasonable at this point. 02/28/2020 on evaluation today patient appears to be doing well in general in regard to her wound although she has a lot of callus buildup as compared to last time I saw her. This is can require sharp debridement today. I do believe she really needs the total contact cast as well which we have discussed previous. 7/23; patient comes in for a total contact cast change 03/06/2020 on evaluation today patient appears to be doing quite well with regard to her wounds. Fortunately the wound bed is measuring smaller and looking much better there is little callus noted although there is some debridement necessary today. 03/13/2020 on evaluation today patient's wound actually appears to be doing excellent which is great news. With that being said unfortunately she is having some issues currently with her left leg where she does have cellulitis it appears. This may have come from an area that rubbed underneath the cast from last week that we noted we padded that area and it looks to be doing excellent at this point but nonetheless the leg was somewhat painful, swollen, and somewhat erythematous. She also had an elevated white blood cell count of 11.5 based on what I saw on looking at her records from the med center in [facility] from where she was seen yesterday. Unfortunately with [place]  having a provider on vacation there was no one here in the clinic in the afternoon when she called therefore she went to the ER as advised. Subsequently they did not cut off the cast as they did not have anyone from orthopedics  there to do so and subsequently also did not have the ability to do the Doppler for evaluation of DVT They recommended therefore given her dose of Eliquis as well as . Augmentin and sent her home to come see [place] today to have the cast taken off and then she is supposed to go back to have the study for DVT performed they are following. 03/20/2020 upon evaluation today patient appears to be doing well with regard to her foot all things considered we have not been able to use the total contact cast due to the infection that she had last week. She has been on the doxycycline and she had a 10-day supply of that I do believe that is helping and her leg appears to be doing better. With that being said there is fortunately no signs of active infection systemically at this time which is good news. No fevers, chills, nausea, vomiting, or diarrhea. 03/27/2020 upon evaluation today patient appears to be doing well with regard to her foot ulcer. There does not appear to be signs of active infection which is great news. Overall I am very pleased with where things stand at this point. 04/03/2020 upon evaluation today patient appears to be doing pretty well in regard to the overall appearance of her wound. Fortunately there is no signs of active infection at this time which is great news. No fevers, chills, nausea, vomiting, or diarrhea. With that being said she does have some blue-green drainage that actually is a little bit concerning to me for the possibility of Pseudomonas. I discussed that with the patient today. With that being said I do believe that we may be able to manage this however with the topical antibiotic cream as opposed to having to do anything oral especially since she seems to be doing so well with overall appearance of the wound. 04/10/2020 on evaluation today patient appears to be doing about the same roughly in regard to the overall size of her wound. With that being said she fortunately has not  shown any signs of worsening overall which is good news. I do believe that she is doing a great job trying to offload but again she may still do better with the cast. I do not see in the blue-green drainage that we noticed previously I do believe the gentamicin help in this regard. 04/17/2020 on evaluation today patient's wound appears to be doing about the same at this point. There is no significant improvement at this point. No fever chills noted. She is up for put the cast back on the day. That she states in a couple weeks she will need to have this off to go to a workshop. 04/24/2020 on evaluation today patient appears to be doing significantly better in regard to her wound. Fortunately there is no signs of active infection and overall feel like she is making great progress the cast seems to have done excellent for her. 05/01/2020 upon evaluation today patient presents for reevaluation she really does not appear to be doing too badly in regard to the actual wound on the left foot we have been managing. Unfortunately she has bilateral lower extremity edema with blisters between the webspace of her first and second toe on both feet.  She has a tremendous amount of edema in the legs which I think is where this is coming from it does not appear to be infected but nonetheless I do believe this is can be something that needs to be addressed today. Obviously this means we probably will not be putting the cast on at this point. She attributes this to the fact that she was sitting with her feet on the floor much longer during a conference last week she had a great time but unfortunately had a lot of complications as a result. 05/08/2020 upon evaluation today patient appears to be doing somewhat better in regard to her wounds at this time. Fortunately there is no signs of active infection which is great news. With that being said I do believe that the blisters have ruptured and unfortunately did not just reattach I  will remove some of the blistered tissue today. With that being said I do think the wound itself on the plantar aspect of left foot does need to have sharp debridement. 05/15/2020 upon evaluation today patient appears to be doing about the same in regard to her foot ulcer. Unfortunately in the past week her husband had a fall where he sustained a mild traumatic brain bleed. Fortunately he is doing better but being that he was in the hospital she had a walk on this a lot more. The wound does not appear to be any better is also not really appearing to be significantly worse which is good news. There is no signs of active infection at this time. 10/14; patient with a small diabetic wound on the medial part of her left foot. We have been using silver collagen a total contact cast making good progress. I think the patient had a series of blisters on her dorsal foot probably secondary to having her legs recumbent for 3 days while in a conference in [place]. We wrapped her leg last week these are all healed. We did not previously have her in compression on the right leg. 05/29/2020 upon evaluation today patient appears to be doing well with regard to the wound on the plantar aspect of her foot medially. This is measuring smaller and looking much better than last time I saw her. Again when I did see her last was 2 weeks back and the wound was significantly larger. I do believe the cast is helping and I believe the collagen is a good option for her. 06/05/2020 on evaluation today patient appears to be doing well with regard to her foot ulcer this is actually measuring significantly better and overall I feel like she is doing excellent. There is no signs of active infection at this time. 06/12/2020 upon evaluation today patient actually continues to show signs of good improvement which is excellent news. I am extremely pleased with how she seems to be progressing at this point in regard to her wound. There is  still some depth to the wound but I do believe the collagen is helping her quite a bit. 06/19/2020 upon evaluation today patient appears to be doing well with regard to her plantar foot ulcer. She is actually making excellent progress and in fact this appears to be almost completely healed. With that being said I do believe that the patient is going to actually require 1 more week in the cast although after that I am hopeful she will be ready for discharge. 06/26/2020 on evaluation today patient appears to be doing well in regard to her wound currently. Fortunately there  is no signs of active infection in general I feel like she is doing excellent. This appears to be completely healed I think she is ready to come out of the cast. 11/29; patient comes back in the clinic today with a very quick reopening in the exact same area on the medial plantar foot. She had been healed out last time. She went back into some new balance shoes that she got at [employer] with a custom insert. As it turns out this wound also happened when wearing these shoes although there was some modification made I think with the wound initially happened they added some foam around the wound area. This obviously is not going to be sufficient. 07/17/2020 on evaluation today patient appears to be doing well with regard to her wound. Fortunately she seems to be making good progress. Unfortunately she was in the hospital due to a issue with colitis and had just been discharged today in fact. She tells me that her biggest concern here is that a lot of her numbers especially her creatinine were somewhat elevated and problematic. She is can be following up with her provider in order to have a further work-up at this time. With that being said she did need to come back in for a cast change. She did not allow them to remove the cast due to the fact that she did not feel like that was the issue whatsoever and indeed it does not appear that was the  case her wound appears to be doing excellent today. 07/24/2020 upon evaluation today patient appears to be doing well in regard to her foot ulcer. She still has a small opening but this is showing signs of excellent improvement overall but they were very close to complete resolution. No fevers, chills, nausea, vomiting, or diarrhea. 07/31/2020 upon evaluation today patient actually appears to be doing excellent she is actually completely healed this is great news. Fortunately there is no signs of active infection at this time. No fevers, chills, nausea, vomiting, or diarrhea. The patient tells me that she did see Dr. Paulla Dolly in order to get to [employer] so that she can have a custom shoe made. She saw him earlier last week. She does have an appointment with Liliane Channel on the sixth I believe on January she tells me Readmission: 08/28/2020 on evaluation today patient appears to be doing poorly in regard to her wound currently. She tells me this has reopened. The good news that she did get measured for and actually her shoes are on the way in from [facility].. With that being said she has been trying to stay off of this is much as possible using her wheelchair around home. Nonetheless has been somewhat difficult. The good news is her shoes should be here next week 09/04/2020 upon evaluation today patient appears to be doing well with regard to her wound. There is a little bit of callus buildup but nothing too significant she does tell me she was very active this week. Fortunately there is no signs of systemic infection at this point. The dorsal foot wound actually appears to be doing better. I think this is healed. 09/11/2020 upon evaluation today patient appears to actually be doing quite well in my opinion based on what I am seeing today. Fortunately there is no signs of infection in fact her mother is certain that this is even open any longer based on what I see. Fortunately I think the patient has been doing  everything she can  to try to keep this under control. 09/18/2020 upon evaluation today patient appears to be doing about the same in regard to her foot ulcer. She has been tolerating the dressing changes without complication. Fortunately there is no sign of active infection at this time. No fevers, chills, nausea, vomiting, or diarrhea. She is going require some sharp debridement today. 09/25/2020 upon evaluation today patient appears to be doing well with regard to his wounds she has been tolerating the dressing changes without complication. Her wound appears to be completely healed based on what I am seeing at this point. There does not appear to be any signs of active infection at this time which is great news. No fevers, chills, nausea, vomiting, or diarrhea. She did get the cushion for her foot as well that she ordered to try to help keep pressure off of this area. That looks like it may be very beneficial for her to be honest. 10/02/2020 upon evaluation today patient appears to be doing more poorly in regard to her foot ulcer. Unfortunately this has reopened since we saw her last week. It apparently did not take too long at all for this to happen. She is obviously somewhat disappointed as she was hopeful that that would be time this thing would stay closed. Fortunately there is no evidence of active infection at this point. No fevers, chills, nausea, vomiting, or diarrhea. With that being said I am good have to perform a little bit of sharp debridement clear away some of the debris currently. Including a minimal amount of callus at this time. 10/16/2020 upon evaluation today patient appears to be doing about the same in regard to her foot ulcer unfortunately. There does not appear to be any signs of active infection which is great news. No fever chills noted she has been tolerating the dressing changes without complication which is great news. Unfortunately she tells me that she has been very  depressed about the situation is getting very frustrating to her that she continues to have issues despite everything that she is trying to do to stay off of her foot. She is extremely discouraged and I do hate to hear this. Obviously we have been hopeful that if she got her shoes that would make a difference she is actually can be getting those tomorrow but I am not certain that that alone is good to be enough to get this to heal. We may end up having to consider going back into a total contact cast to get this to heal and then subsequently once we get her healed get her into her new diabetic shoes which will and my hope anyway keep this from reopening again. Electronic Signature(s) Signed: 10/16/2020 3:13:45 PM By: Worthy Keeler PA-C Entered By: Worthy Keeler on 10/16/2020 15:13:45 -------------------------------------------------------------------------------- Physical Exam Details Patient Name: Date of Service: [name] [name] [name], [name] [name] RBA RA E. 10/16/2020 1:45 PM Medical Record Number: [id number] Patient Account Number: [id number] Date of Birth/Sex: Treating RN: 1946/03/19 (75 y.o. [name] Other Clinician: Referring Provider: Treating Provider/Extender: Adele Schilder in Treatment: 57 Constitutional Well-nourished and well-hydrated in no acute distress. Respiratory normal breathing without difficulty. Psychiatric this patient is able to make decisions and demonstrates good insight into disease process. Alert and Oriented x 3. pleasant and cooperative. Notes Upon inspection patient's wound bed actually showed signs of good granulation at this time. There does not appear to be any evidence of active infection at this point. No  fever chills noted I did perform sharp debridement clear away some of the callus around the edges of the wound including slough and necrotic tissue as well and the patient tolerated all of this today without  complication. Post debridement the wound bed appears to be doing much better which is great news. Electronic Signature(s) Signed: 10/16/2020 3:14:35 PM By: Worthy Keeler PA-C Entered By: Worthy Keeler on 10/16/2020 15:14:35 -------------------------------------------------------------------------------- Physician Orders Details Patient Name: Date of Service: [name] [name] [name], [name] [name] RBA RA E. 10/16/2020 1:45 PM Medical Record Number: [id number] Patient Account Number: [id number] Date of Birth/Sex: Treating RN: 1945-11-10 (75 y.o. [name] Other Clinician: Referring Provider: Treating Provider/Extender: Adele Schilder in Treatment: 70 Verbal / Phone Orders: No Diagnosis Coding ICD-10 Coding Code Description E11.621 Type 2 diabetes mellitus with foot ulcer L97.522 Non-pressure chronic ulcer of other part of left foot with fat layer exposed M14.672 Charcot's joint, left ankle and foot E11.40 Type 2 diabetes mellitus with diabetic neuropathy, unspecified Follow-up Appointments Return Appointment in 1 week. Bathing/ Shower/ Hygiene May shower and wash wound with soap and water. - with dressing changes Off-Loading Open toe surgical shoe to: - left foot Additional Orders / Instructions Follow Nutritious Diet Wound Treatment Wound #1RRR - Foot Wound Laterality: Plantar, Left, Medial Prim Dressing: KerraCel Ag Gelling Fiber Dressing, 2x2 in (silver alginate) Every Other Day/30 Days ary Discharge Instructions: Apply silver alginate to wound bed as instructed Secondary Dressing: Woven Gauze Sponges 2x2 in Every Other Day/30 Days Discharge Instructions: Apply over primary dressing as directed. Secondary Dressing: Optifoam Non-Adhesive Dressing, 4x4 in Every Other Day/30 Days Discharge Instructions: Apply over primary dressing cut to make foam donut Secured With: Tegaderm Transparent Film Dressing 4x4.75 (in/in) Every Other Day/30  Days Discharge Instructions: Secure dressing with Tegaderm as directed. Notes Will consider TCC next visit after being fitted for shoes Electronic Signature(s) Signed: 10/16/2020 6:04:37 PM By: Worthy Keeler PA-C Signed: 10/18/2020 6:00:02 PM By: [name] RN, BSN Entered By: [name] on 10/16/2020 15:09:45 -------------------------------------------------------------------------------- Problem List Details Patient Name: Date of Service: [name] [name] [name] RBA RA E. 10/16/2020 1:45 PM Medical Record Number: [id number] Patient Account Number: [id number] Date of Birth/Sex: Treating RN: 10/02/1945 (75 y.o. [name] Other Clinician: Referring Provider: Treating Provider/Extender: Adele Schilder in Treatment: 75 Active Problems ICD-10 Encounter Code Description Active Date MDM Diagnosis E11.621 Type 2 diabetes mellitus with foot ulcer 01/17/2020 No Yes L97.522 Non-pressure chronic ulcer of other part of left foot with fat layer exposed 01/17/2020 No Yes M14.672 Charcot's joint, left ankle and foot 01/17/2020 No Yes E11.40 Type 2 diabetes mellitus with diabetic neuropathy, unspecified 01/17/2020 No Yes Inactive Problems ICD-10 Code Description Active Date Inactive Date I10 Essential (primary) hypertension 01/17/2020 01/17/2020 Resolved Problems Electronic Signature(s) Signed: 10/16/2020 2:01:01 PM By: Worthy Keeler PA-C Entered By: Worthy Keeler on 10/16/2020 14:01:01 -------------------------------------------------------------------------------- Progress Note Details Patient Name: Date of Service: [name] [name] [name], [name] [name] RBA RA E. 10/16/2020 1:45 PM Medical Record Number: [id number] Patient Account Number: [id number] Date of Birth/Sex: Treating RN: 03/05/1946 (75 y.o. [name] Other Clinician: Referring Provider: Treating Provider/Extender: Adele Schilder in Treatment:  48 Subjective Chief Complaint Information obtained from Patient Left foot ulcer History of Present Illness (HPI) 01/17/2020 upon evaluation today patient presents for initial evaluation here in our clinic concerning issues she has been having with a left medial/plantar  foot ulcer. This is actually been an issue for her since October 2020. She has been seeing Dr. Doran Durand for quite some time during that course. Fortunately there is no signs of active infection at this time. Or least no mention of this to have seen in general. With that being said unfortunately I do see some signs of erythema noted today that does have me concerned about the possibility of infection at this point in the surrounding area of the wound. There is also a warm to touch at the site which is somewhat concerning. Fortunately there is no evidence of systemic infection which is great news. The patient does have a history of diabetes mellitus type 2, Charcot foot which is what led to the wound, and hypertension. She notes that she was in a cast for some time with Dr. Doran Durand for about 8 weeks. During that time they were utilizing according to the patient silver nitrate along with a foam doughnut and then Coban to secure in place in the cast in place. With that being said I do not have the actual records to review we are going to try to get a hold of those unfortunately they would not flow over into care everywhere I did look today. She has been seeing Dr. Doran Durand and his physician assistant Larkin Ina up until the end of May and apparently is still seeing them on a regular basis every 2 weeks roughly. She has also tried Iodosorb without effect here. 01/24/2020 upon evaluation today patient actually appears to be doing quite well with regard to her wounds. She has been tolerating the dressing changes without complication. Fortunately there is no signs of active infection spreading which is good news. Her culture did show signs of Staph  aureus I did place her on Augmentin due to the erythema surrounding the wound. With that being said the wound does appear to be doing better she has her longer walking cast/boot and I think that is actually good for her for the time being. I am considering reinitiating total contact cast when she gets back from vacation but next week she will actually be out of town at [employer] she knows not to get in the water but she still obviously is planning to enjoy herself she is going to take it easy on her foot however. 02/07/2020 upon evaluation today patient appears to be doing fairly well in regard to her ulcer on her foot. Fortunately there is no signs of severe infection at this time which is great news and overall very pleased in that regard. With that being said I do think that she could still benefit from a total contact cast. Nonetheless she is using her walking boot which at least provide some protection and that it prevents some of the friction occurring when she is ambulating. 02/14/2020 upon evaluation today patient appears to be doing well with regard to her foot ulcer. This is actually measuring a little bit smaller yet again this week. Overall very pleased with where things stand and I do not see any signs of active infection at this time which is also good news. Since she is measuring better the patient has wanting to somewhat hold off on proceeding with the total contact cast which I think is reasonable at this point. 02/28/2020 on evaluation today patient appears to be doing well in general in regard to her wound although she has a lot of callus buildup as compared to last time I saw her. This is can  require sharp debridement today. I do believe she really needs the total contact cast as well which we have discussed previous. 7/23; patient comes in for a total contact cast change 03/06/2020 on evaluation today patient appears to be doing quite well with regard to her wounds. Fortunately the  wound bed is measuring smaller and looking much better there is little callus noted although there is some debridement necessary today. 03/13/2020 on evaluation today patient's wound actually appears to be doing excellent which is great news. With that being said unfortunately she is having some issues currently with her left leg where she does have cellulitis it appears. This may have come from an area that rubbed underneath the cast from last week that we noted we padded that area and it looks to be doing excellent at this point but nonetheless the leg was somewhat painful, swollen, and somewhat erythematous. She also had an elevated white blood cell count of 11.5 based on what I saw on looking at her records from the med center in [facility] from where she was seen yesterday. Unfortunately with [place] having a provider on vacation there was no one here in the clinic in the afternoon when she called therefore she went to the ER as advised. Subsequently they did not cut off the cast as they did not have anyone from orthopedics there to do so and subsequently also did not have the ability to do the Doppler for evaluation of DVT They recommended therefore given her dose of Eliquis as well as . Augmentin and sent her home to come see [place] today to have the cast taken off and then she is supposed to go back to have the study for DVT performed they are following. 03/20/2020 upon evaluation today patient appears to be doing well with regard to her foot all things considered we have not been able to use the total contact cast due to the infection that she had last week. She has been on the doxycycline and she had a 10-day supply of that I do believe that is helping and her leg appears to be doing better. With that being said there is fortunately no signs of active infection systemically at this time which is good news. No fevers, chills, nausea, vomiting, or diarrhea. 03/27/2020 upon evaluation today patient appears  to be doing well with regard to her foot ulcer. There does not appear to be signs of active infection which is great news. Overall I am very pleased with where things stand at this point. 04/03/2020 upon evaluation today patient appears to be doing pretty well in regard to the overall appearance of her wound. Fortunately there is no signs of active infection at this time which is great news. No fevers, chills, nausea, vomiting, or diarrhea. With that being said she does have some blue-green drainage that actually is a little bit concerning to me for the possibility of Pseudomonas. I discussed that with the patient today. With that being said I do believe that we may be able to manage this however with the topical antibiotic cream as opposed to having to do anything oral especially since she seems to be doing so well with overall appearance of the wound. 04/10/2020 on evaluation today patient appears to be doing about the same roughly in regard to the overall size of her wound. With that being said she fortunately has not shown any signs of worsening overall which is good news. I do believe that she is doing a great  job trying to offload but again she may still do better with the cast. I do not see in the blue-green drainage that we noticed previously I do believe the gentamicin help in this regard. 04/17/2020 on evaluation today patient's wound appears to be doing about the same at this point. There is no significant improvement at this point. No fever chills noted. She is up for put the cast back on the day. That she states in a couple weeks she will need to have this off to go to a workshop. 04/24/2020 on evaluation today patient appears to be doing significantly better in regard to her wound. Fortunately there is no signs of active infection and overall feel like she is making great progress the cast seems to have done excellent for her. 05/01/2020 upon evaluation today patient presents for reevaluation  she really does not appear to be doing too badly in regard to the actual wound on the left foot we have been managing. Unfortunately she has bilateral lower extremity edema with blisters between the webspace of her first and second toe on both feet. She has a tremendous amount of edema in the legs which I think is where this is coming from it does not appear to be infected but nonetheless I do believe this is can be something that needs to be addressed today. Obviously this means we probably will not be putting the cast on at this point. She attributes this to the fact that she was sitting with her feet on the floor much longer during a conference last week she had a great time but unfortunately had a lot of complications as a result. 05/08/2020 upon evaluation today patient appears to be doing somewhat better in regard to her wounds at this time. Fortunately there is no signs of active infection which is great news. With that being said I do believe that the blisters have ruptured and unfortunately did not just reattach I will remove some of the blistered tissue today. With that being said I do think the wound itself on the plantar aspect of left foot does need to have sharp debridement. 05/15/2020 upon evaluation today patient appears to be doing about the same in regard to her foot ulcer. Unfortunately in the past week her husband had a fall where he sustained a mild traumatic brain bleed. Fortunately he is doing better but being that he was in the hospital she had a walk on this a lot more. The wound does not appear to be any better is also not really appearing to be significantly worse which is good news. There is no signs of active infection at this time. 10/14; patient with a small diabetic wound on the medial part of her left foot. We have been using silver collagen a total contact cast making good progress. I think the patient had a series of blisters on her dorsal foot probably secondary to  having her legs recumbent for 3 days while in a conference in [place]. We wrapped her leg last week these are all healed. We did not previously have her in compression on the right leg. 05/29/2020 upon evaluation today patient appears to be doing well with regard to the wound on the plantar aspect of her foot medially. This is measuring smaller and looking much better than last time I saw her. Again when I did see her last was 2 weeks back and the wound was significantly larger. I do believe the cast is helping and I believe the  collagen is a good option for her. 06/05/2020 on evaluation today patient appears to be doing well with regard to her foot ulcer this is actually measuring significantly better and overall I feel like she is doing excellent. There is no signs of active infection at this time. 06/12/2020 upon evaluation today patient actually continues to show signs of good improvement which is excellent news. I am extremely pleased with how she seems to be progressing at this point in regard to her wound. There is still some depth to the wound but I do believe the collagen is helping her quite a bit. 06/19/2020 upon evaluation today patient appears to be doing well with regard to her plantar foot ulcer. She is actually making excellent progress and in fact this appears to be almost completely healed. With that being said I do believe that the patient is going to actually require 1 more week in the cast although after that I am hopeful she will be ready for discharge. 06/26/2020 on evaluation today patient appears to be doing well in regard to her wound currently. Fortunately there is no signs of active infection in general I feel like she is doing excellent. This appears to be completely healed I think she is ready to come out of the cast. 11/29; patient comes back in the clinic today with a very quick reopening in the exact same area on the medial plantar foot. She had been healed out last  time. She went back into some new balance shoes that she got at [employer] with a custom insert. As it turns out this wound also happened when wearing these shoes although there was some modification made I think with the wound initially happened they added some foam around the wound area. This obviously is not going to be sufficient. 07/17/2020 on evaluation today patient appears to be doing well with regard to her wound. Fortunately she seems to be making good progress. Unfortunately she was in the hospital due to a issue with colitis and had just been discharged today in fact. She tells me that her biggest concern here is that a lot of her numbers especially her creatinine were somewhat elevated and problematic. She is can be following up with her provider in order to have a further work-up at this time. With that being said she did need to come back in for a cast change. She did not allow them to remove the cast due to the fact that she did not feel like that was the issue whatsoever and indeed it does not appear that was the case her wound appears to be doing excellent today. 07/24/2020 upon evaluation today patient appears to be doing well in regard to her foot ulcer. She still has a small opening but this is showing signs of excellent improvement overall but they were very close to complete resolution. No fevers, chills, nausea, vomiting, or diarrhea. 07/31/2020 upon evaluation today patient actually appears to be doing excellent she is actually completely healed this is great news. Fortunately there is no signs of active infection at this time. No fevers, chills, nausea, vomiting, or diarrhea. The patient tells me that she did see Dr. Paulla Dolly in order to get to [place] so that she can have a custom shoe made. She saw him earlier last week. She does have an appointment with Liliane Channel on the sixth I believe on January she tells me Readmission: 08/28/2020 on evaluation today patient appears to be doing poorly in  regard to her wound  currently. She tells me this has reopened. The good news that she did get measured for and actually her shoes are on the way in from [facility].. With that being said she has been trying to stay off of this is much as possible using her wheelchair around home. Nonetheless has been somewhat difficult. The good news is her shoes should be here next week 09/04/2020 upon evaluation today patient appears to be doing well with regard to her wound. There is a little bit of callus buildup but nothing too significant she does tell me she was very active this week. Fortunately there is no signs of systemic infection at this point. The dorsal foot wound actually appears to be doing better. I think this is healed. 09/11/2020 upon evaluation today patient appears to actually be doing quite well in my opinion based on what I am seeing today. Fortunately there is no signs of infection in fact her mother is certain that this is even open any longer based on what I see. Fortunately I think the patient has been doing everything she can to try to keep this under control. 09/18/2020 upon evaluation today patient appears to be doing about the same in regard to her foot ulcer. She has been tolerating the dressing changes without complication. Fortunately there is no sign of active infection at this time. No fevers, chills, nausea, vomiting, or diarrhea. She is going require some sharp debridement today. 09/25/2020 upon evaluation today patient appears to be doing well with regard to his wounds she has been tolerating the dressing changes without complication. Her wound appears to be completely healed based on what I am seeing at this point. There does not appear to be any signs of active infection at this time which is great news. No fevers, chills, nausea, vomiting, or diarrhea. She did get the cushion for her foot as well that she ordered to try to help keep pressure off of this area. That looks like  it may be very beneficial for her to be honest. 10/02/2020 upon evaluation today patient appears to be doing more poorly in regard to her foot ulcer. Unfortunately this has reopened since we saw her last week. It apparently did not take too long at all for this to happen. She is obviously somewhat disappointed as she was hopeful that that would be time this thing would stay closed. Fortunately there is no evidence of active infection at this point. No fevers, chills, nausea, vomiting, or diarrhea. With that being said I am good have to perform a little bit of sharp debridement clear away some of the debris currently. Including a minimal amount of callus at this time. 10/16/2020 upon evaluation today patient appears to be doing about the same in regard to her foot ulcer unfortunately. There does not appear to be any signs of active infection which is great news. No fever chills noted she has been tolerating the dressing changes without complication which is great news. Unfortunately she tells me that she has been very depressed about the situation is getting very frustrating to her that she continues to have issues despite everything that she is trying to do to stay off of her foot. She is extremely discouraged and I do hate to hear this. Obviously we have been hopeful that if she got her shoes that would make a difference she is actually can be getting those tomorrow but I am not certain that that alone is good to be enough to get this  to heal. We may end up having to consider going back into a total contact cast to get this to heal and then subsequently once we get her healed get her into her new diabetic shoes which will and my hope anyway keep this from reopening again. Objective Constitutional Well-nourished and well-hydrated in no acute distress. Vitals Time Taken: 2:19 PM, Height: 66 in, Weight: 245 lbs, BMI: 39.5, Temperature: 99.3 F, Pulse: 103 bpm, Respiratory Rate: 18 breaths/min,  Blood Pressure: 155/85 mmHg. Respiratory normal breathing without difficulty. Psychiatric this patient is able to make decisions and demonstrates good insight into disease process. Alert and Oriented x 3. pleasant and cooperative. General Notes: Upon inspection patient's wound bed actually showed signs of good granulation at this time. There does not appear to be any evidence of active infection at this point. No fever chills noted I did perform sharp debridement clear away some of the callus around the edges of the wound including slough and necrotic tissue as well and the patient tolerated all of this today without complication. Post debridement the wound bed appears to be doing much better which is great news. Integumentary (Hair, Skin) Wound #1RRR status is Open. Original cause of wound was Gradually Appeared. The date acquired was: 05/11/2019. The wound has been in treatment 39 weeks. The wound is located on the [place]. The wound measures 0.4cm length x 0.5cm width x 0.3cm depth; 0.157cm^2 area and 0.047cm^3 volume. There is Fat Layer (Subcutaneous Tissue) exposed. There is no tunneling or undermining noted. There is a medium amount of serosanguineous drainage noted. The wound margin is thickened. There is large (67-100%) pink granulation within the wound bed. There is no necrotic tissue within the wound bed. Wound #7 status is Healed - Epithelialized. Original cause of wound was Trauma. The date acquired was: 09/30/2020. The wound has been in treatment 2 weeks. The wound is located on the Left T Second. The wound measures 0cm length x 0cm width x 0cm depth; 0cm^2 area and 0cm^3 volume. There is no tunneling oe or undermining noted. There is a none present amount of drainage noted. The wound margin is distinct with the outline attached to the wound base. There is no granulation within the wound bed. There is no necrotic tissue within the wound bed. Assessment Active  Problems ICD-10 Type 2 diabetes mellitus with foot ulcer Non-pressure chronic ulcer of other part of left foot with fat layer exposed Charcot's joint, left ankle and foot Type 2 diabetes mellitus with diabetic neuropathy, unspecified Procedures Wound #1RRR Pre-procedure diagnosis of Wound #1RRR is a Diabetic Wound/Ulcer of the Lower Extremity located on the Left,Medial,Plantar Foot .Severity of Tissue Pre Debridement is: Fat layer exposed. There was a Excisional Skin/Subcutaneous Tissue Debridement with a total area of 8.4 sq cm performed by Worthy Keeler, PA. With the following instrument(s): Curette to remove Viable and Non-Viable tissue/material. Material removed includes Callus, Subcutaneous Tissue, and Slough after achieving pain control using Lidocaine 4% Topical Solution. No specimens were taken. A time out was conducted at 15:00, prior to the start of the procedure. A Minimum amount of bleeding was controlled with Pressure. The procedure was tolerated well with a pain level of 0 throughout and a pain level of 0 following the procedure. Post Debridement Measurements: 0.7cm length x 0.4cm width x 0.3cm depth; 0.066cm^3 volume. Character of Wound/Ulcer Post Debridement is improved. Severity of Tissue Post Debridement is: Fat layer exposed. Post procedure Diagnosis Wound #1RRR: Same as Pre-Procedure Plan Follow-up Appointments: Return Appointment  in 1 week. Bathing/ Shower/ Hygiene: May shower and wash wound with soap and water. - with dressing changes Off-Loading: Open toe surgical shoe to: - left foot Additional Orders / Instructions: Follow Nutritious Diet General Notes: Will consider TCC next visit after being fitted for shoes WOUND #1RRR: - Foot Wound Laterality: Plantar, Left, Medial Prim Dressing: KerraCel Ag Gelling Fiber Dressing, 2x2 in (silver alginate) Every Other Day/30 Days ary Discharge Instructions: Apply silver alginate to wound bed as instructed Secondary  Dressing: Woven Gauze Sponges 2x2 in Every Other Day/30 Days Discharge Instructions: Apply over primary dressing as directed. Secondary Dressing: Optifoam Non-Adhesive Dressing, 4x4 in Every Other Day/30 Days Discharge Instructions: Apply over primary dressing cut to make foam donut Secured With: Tegaderm Transparent Film Dressing 4x4.75 (in/in) Every Other Day/30 Days Discharge Instructions: Secure dressing with T egaderm as directed. 1. Would recommend that we going continue with wound care measures as before and the patient is in agreement with the plan this includes the use of the silver alginate dressing which I think is done decently good job. 2. I am also can recommend at this point that we have the patient going continue with the gauze followed by the T egaderm dressing over top in order to secure breathing in place. 3. I am also can recommend that she continue to prevent as much pressure to the wound as possible as far as offloading is concerned. With that being said I know she is doing a tremendous amount here and I know she is very frustrated despite everything that she has tried and done. Therefore I think we may need to consider going back into the total contact cast to get this to heal. Once we get it healed then we will be able to proceed at that point with getting her into the diabetic shoe that she has had made so that hopefully she will be able to keep this closed from once and for all. We will see the patient back for follow-up visit in 1 week. If she has any concerns or questions in the meantime she will contact the office and let us know. Electronic Signature(s) Signed: 10/16/2020 3:16:34 PM By: Worthy Keeler PA-C Entered By: Worthy Keeler on 10/16/2020 15:16:34 -------------------------------------------------------------------------------- SuperBill Details Patient Name: Date of Service: [name] [name] [name], [name] [name] RBA RA E. 10/16/2020 Medical Record Number: [id number] Patient Account  Number: [id number] Date of Birth/Sex: Treating RN: 10-Jun-1946 (75 y.o. [name] Other Clinician: Referring Provider: Treating Provider/Extender: Adele Schilder in Treatment: 39 Diagnosis Coding ICD-10 Codes Code Description E11.621 Type 2 diabetes mellitus with foot ulcer L97.522 Non-pressure chronic ulcer of other part of left foot with fat layer exposed M14.672 Charcot's joint, left ankle and foot E11.40 Type 2 diabetes mellitus with diabetic neuropathy, unspecified Facility Procedures CPT4 Code: [id number] 1 Description: 7989 - DEB SUBQ TISSUE 20 SQ CM/< ICD-10 Diagnosis Description L97.522 Non-pressure chronic ulcer of other part of left foot with fat layer exposed E11.621 Type 2 diabetes mellitus with foot ulcer Modifier: Quantity: 1 Physician Procedures : CPT4 Code Description Modifier [id number] - WC PHYS SUBQ TISS 20 SQ CM ICD-10 Diagnosis Description L97.522 Non-pressure chronic ulcer of other part of left foot with fat layer exposed E11.621 Type 2 diabetes mellitus with foot ulcer Quantity: 1 Electronic Signature(s) Signed: 10/16/2020 3:17:00 PM By: Worthy Keeler PA-C Entered By: Worthy Keeler on 10/16/2020 15:16:59

## 2020-10-17 NOTE — Progress Notes (Signed)
The patient presented to the office today to pick up diabetic shoes and diabetic custom inserts.  1 pair of inserts were put in the shoes and the shoes were fitted to the patient.  The patient states they are comfortable but both heels felt like they were sliding up and down and I fitted two thin inserts into the shoes and patient stated that was a lot better. She was satisfied with the fit of the shoe. Instructions for break in and wear were dispensed. The patient signed the delivery documentation and break in instruction form.  If any concerns or questions arise, she is instructed to call.

## 2020-10-18 ENCOUNTER — Other Ambulatory Visit: Payer: Self-pay | Admitting: Internal Medicine

## 2020-10-21 NOTE — Progress Notes (Signed)
[name], [name] ([id number]) Visit Report for 10/16/2020 Arrival Information Details Patient Name: Date of Service: [name] [name], [name] E. 10/16/2020 1:45 PM Medical Record Number: [id number] Patient Account Number: [id number] Date of Birth/Sex: Treating RN: 1946-05-22 (75 y.o. [name] Primary Care Devynne Sturdivant: Cheryll Cockayne Other Clinician: Referring Waleed Dettman: Treating Caralina Nop/Extender: Rickard Patience in Treatment: 55 Visit Information History Since Last Visit Added or deleted any medications: No Patient Arrived: Wheel Chair Any new allergies or adverse reactions: No Arrival Time: 14:19 Had a fall or experienced change in No Accompanied By: alone activities of daily living that may affect Transfer Assistance: None risk of falls: Patient Identification Verified: Yes Signs or symptoms of abuse/neglect since last visito No Secondary Verification Process Completed: Yes Hospitalized since last visit: No Patient Requires Transmission-Based Precautions: No Implantable device outside of the clinic excluding No Patient Has Alerts: No cellular tissue based products placed in the center since last visit: Has Dressing in Place as Prescribed: Yes Pain Present Now: No Electronic Signature(s) Signed: 10/17/2020 5:16:58 PM By: Zandra Abts RN, BSN Entered By: Zandra Abts on 10/16/2020 14:19:32 -------------------------------------------------------------------------------- Encounter Discharge Information Details Patient Name: Date of Service: [name] [name] RBA RA E. 10/16/2020 1:45 PM Medical Record Number: [id number] Patient Account Number: [id number] Date of Birth/Sex: Treating RN: 1946-03-06 (75 y.o. Ardis Rowan, Lauren Primary Care Cerina Leary: Cheryll Cockayne Other Clinician: Referring Beckett Hickmon: Treating Erica Richwine/Extender: Rickard Patience in Treatment: 39 Encounter Discharge Information Items Post Procedure Vitals Discharge Condition:  Stable Temperature (F): 99.3 Ambulatory Status: Ambulatory Pulse (bpm): 103 Discharge Destination: Home Respiratory Rate (breaths/min): 17 Transportation: Private Auto Blood Pressure (mmHg): 155/85 Accompanied By: self Schedule Follow-up Appointment: Yes Clinical Summary of Care: Patient Declined Electronic Signature(s) Signed: 10/16/2020 5:36:09 PM By: Fonnie Mu RN Entered By: Fonnie Mu on 10/16/2020 15:22:27 -------------------------------------------------------------------------------- Lower Extremity Assessment Details Patient Name: Date of Service: [name], [name] 10/16/2020 1:45 PM Medical Record Number: [id number] Patient Account Number: [id number] Date of Birth/Sex: Treating RN: 03-May-1946 (75 y.o. [name] Primary Care Jacobus Colvin: Cheryll Cockayne Other Clinician: Referring Ran Tullis: Treating Inell Mimbs/Extender: Atlee Abide Weeks in Treatment: 39 Edema Assessment Assessed: [Left: No] [Right: No] Edema: [Left: Ye] [Right: s] Calf Left: Right: Point of Measurement: 29 cm From Medial Instep 38.5 cm Ankle Left: Right: Point of Measurement: 12 cm From Medial Instep 23.5 cm Vascular Assessment Pulses: Dorsalis Pedis Palpable: [Left:Yes] Electronic Signature(s) Signed: 10/17/2020 5:16:58 PM By: Zandra Abts RN, BSN Entered By: Zandra Abts on 10/16/2020 14:20:03 -------------------------------------------------------------------------------- Multi-Disciplinary Care Plan Details Patient Name: Date of Service: Delton [place] RA E. 10/16/2020 1:45 PM Medical Record Number: [id number] Patient Account Number: [id number] Date of Birth/Sex: Treating RN: 1945-11-01 (75 y.o. [name] [name] Primary Care Kara Melching: Cheryll Cockayne Other Clinician: Referring Skylan Gift: Treating Rahim Astorga/Extender: Rickard Patience in Treatment: 39 Multidisciplinary Care Plan reviewed with physician Active Inactive Nutrition Nursing  Diagnoses: Impaired glucose control: actual or potential Potential for alteratiion in Nutrition/Potential for imbalanced nutrition Goals: Patient/caregiver verbalizes understanding of need to maintain therapeutic glucose control per primary care physician Date Initiated: 01/17/2020 Date Inactivated: 02/14/2020 Target Resolution Date: 02/14/2020 Goal Status: Met Patient/caregiver will maintain therapeutic glucose control Date Initiated: 01/17/2020 Target Resolution Date: 10/23/2020 Goal Status: Active Interventions: Assess HgA1c results as ordered upon admission and as needed Assess patient nutrition upon admission and as needed per policy Provide education on elevated blood sugars and impact on wound healing Treatment Activities: Patient referred to Primary Care  Physician for further nutritional evaluation : 01/17/2020 Notes: Wound/Skin Impairment Nursing Diagnoses: Impaired tissue integrity Knowledge deficit related to ulceration/compromised skin integrity Goals: Patient/caregiver will verbalize understanding of skin care regimen Date Initiated: 01/17/2020 Target Resolution Date: 10/23/2020 Goal Status: Active Ulcer/skin breakdown will have a volume reduction of 30% by week 4 Date Initiated: 01/17/2020 Date Inactivated: 02/14/2020 Target Resolution Date: 02/14/2020 Goal Status: Met Ulcer/skin breakdown will have a volume reduction of 50% by week 8 Date Initiated: 02/14/2020 Date Inactivated: 03/13/2020 Target Resolution Date: 03/13/2020 Goal Status: Met Ulcer/skin breakdown will have a volume reduction of 80% by week 12 Date Initiated: 03/13/2020 Date Inactivated: 04/10/2020 Target Resolution Date: 04/10/2020 Goal Status: Unmet Unmet Reason: infection Interventions: Assess patient/caregiver ability to obtain necessary supplies Assess patient/caregiver ability to perform ulcer/skin care regimen upon admission and as needed Assess ulceration(s) every visit Provide education on ulcer and skin  care Treatment Activities: Skin care regimen initiated : 01/17/2020 Topical wound management initiated : 01/17/2020 Notes: Electronic Signature(s) Signed: 10/18/2020 6:00:02 PM By: [name] RN, BSN Entered By: [name] on 10/16/2020 15:00:05 -------------------------------------------------------------------------------- Pain Assessment Details Patient Name: Date of Service: [name] 10/16/2020 1:45 PM Medical Record Number: [id number] Patient Account Number: [id number] Date of Birth/Sex: Treating RN: 1946-05-18 (75 y.o. [name] Primary Care Timika Muench: [name] Other Clinician: Referring Lundon Rosier: Treating Rilyn Upshaw/Extender: Adele Schilder in Treatment: 39 Active Problems Location of Pain Severity and Description of Pain Patient Has Paino No Site Locations Pain Management and Medication Current Pain Management: Electronic Signature(s) Signed: 10/17/2020 5:16:58 PM By: Levan Hurst RN, BSN Entered By: Levan Hurst on 10/16/2020 14:19:56 -------------------------------------------------------------------------------- Patient/Caregiver Education Details Patient Name: Date of Service: [name] Reason 3/9/2022andnbsp1:45 PM Medical Record Number: [id number] Patient Account Number: [id number] Date of Birth/Gender: Treating RN: 02-09-1946 (75 y.o. [name] Primary Care Physician: [name] Other Clinician: Referring Physician: Treating Physician/Extender: Adele Schilder in Treatment: 67 Education Assessment Education Provided To: Patient Education Topics Provided Elevated Blood Sugar/ Impact on Healing: Methods: Explain/Verbal Responses: Reinforcements needed, State content correctly Offloading: Methods: Explain/Verbal Responses: Reinforcements needed, State content correctly Wound/Skin Impairment: Methods: Explain/Verbal Responses: Reinforcements needed, State content  correctly Electronic Signature(s) Signed: 10/18/2020 6:00:02 PM By: [name] RN, BSN Entered By: [name] on 10/16/2020 15:00:35 -------------------------------------------------------------------------------- Wound Assessment Details Patient Name: Date of Service: Brum[place], [name] 10/16/2020 1:45 PM Medical Record Number: [id number] Patient Account Number: [id number] Date of Birth/Sex: Treating RN: 1946-02-16 (75 y.o. [name] Primary Care Dalilah Curlin: [name] Other Clinician: Referring Jacari Iannello: Treating Rajvir Ernster/Extender: Landis Martins Weeks in Treatment: 39 Wound Status Wound Number: 1RRR Primary Diabetic Wound/Ulcer of the Lower Extremity Etiology: Wound Location: Left, Medial, Plantar Foot Wound Open Wounding Event: Gradually Appeared Status: Date Acquired: 05/11/2019 Comorbid Sleep Apnea, Deep Vein Thrombosis, Hypertension, Colitis, Type Weeks Of Treatment: 39 History: II Diabetes, Gout, Osteoarthritis, Neuropathy Clustered Wound: No Photos Wound Measurements Length: (cm) 0.4 Width: (cm) 0.5 Depth: (cm) 0.3 Area: (cm) 0.157 Volume: (cm) 0.047 % Reduction in Area: 84.6% % Reduction in Volume: 88.5% Epithelialization: Medium (34-66%) Tunneling: No Undermining: No Wound Description Classification: Grade 2 Wound Margin: Thickened Exudate Amount: Medium Exudate Type: Serosanguineous Exudate Color: red, brown Foul Odor After Cleansing: No Slough/Fibrino No Wound Bed Granulation Amount: Large (67-100%) Exposed Structure Granulation Quality: Pink Fascia Exposed: No Necrotic Amount: None Present (0%) Fat Layer (Subcutaneous Tissue) Exposed: Yes Tendon Exposed: No Muscle Exposed: No Joint Exposed: No Bone  Exposed: No Treatment Notes Wound #1RRR (Foot) Wound Laterality: Plantar, Left, Medial Cleanser Peri-Wound Care Topical Primary Dressing KerraCel Ag Gelling Fiber Dressing, 2x2 in (silver alginate) Discharge  Instruction: Apply silver alginate to wound bed as instructed Secondary Dressing Woven Gauze Sponges 2x2 in Discharge Instruction: Apply over primary dressing as directed. Optifoam Non-Adhesive Dressing, 4x4 in Discharge Instruction: Apply over primary dressing cut to make foam donut Secured With Tegaderm Transparent Film Dressing 4x4.75 (in/in) Discharge Instruction: Secure dressing with Tegaderm as directed. Compression Wrap Compression Stockings Add-Ons Electronic Signature(s) Signed: 10/18/2020 11:17:21 AM By: Sandre Kitty Signed: 10/21/2020 6:16:37 PM By: Levan Hurst RN, BSN Previous Signature: 10/17/2020 5:16:58 PM Version By: Levan Hurst RN, BSN Entered By: Sandre Kitty on 10/18/2020 11:16:26 -------------------------------------------------------------------------------- Wound Assessment Details Patient Name: Date of Service: [name] 10/16/2020 1:45 PM Medical Record Number: [id number] Patient Account Number: [id number] Date of Birth/Sex: Treating RN: February 14, 1946 (75 y.o. [name] Primary Care Saivion Goettel: [name] Other Clinician: Referring Othel Hoogendoorn: Treating Peter Daquila/Extender: Landis Martins Weeks in Treatment: 39 Wound Status Wound Number: 7 Primary Diabetic Wound/Ulcer of the Lower Extremity Etiology: Wound Location: Left T Second oe Wound Healed - Epithelialized Wounding Event: Trauma Status: Date Acquired: 09/30/2020 Comorbid Sleep Apnea, Deep Vein Thrombosis, Hypertension, Colitis, Type Weeks Of Treatment: 2 History: II Diabetes, Gout, Osteoarthritis, Neuropathy Clustered Wound: No Wound Measurements Length: (cm) Width: (cm) Depth: (cm) Area: (cm) Volume: (cm) 0 % Reduction in Area: 100% 0 % Reduction in Volume: 100% 0 Epithelialization: Large (67-100%) 0 Tunneling: No 0 Undermining: No Wound Description Classification: Grade 1 Wound Margin: Distinct, outline attached Exudate Amount: None Present Foul  Odor After Cleansing: No Slough/Fibrino No Wound Bed Granulation Amount: None Present (0%) Exposed Structure Necrotic Amount: None Present (0%) Fascia Exposed: No Fat Layer (Subcutaneous Tissue) Exposed: No Tendon Exposed: No Muscle Exposed: No Joint Exposed: No Bone Exposed: No Electronic Signature(s) Signed: 10/17/2020 5:16:58 PM By: Levan Hurst RN, BSN Entered By: Levan Hurst on 10/16/2020 14:21:18 -------------------------------------------------------------------------------- [place] Details Patient Name: Date of Service: [place], [name] 10/16/2020 1:45 PM Medical Record Number: [id number] Patient Account Number: [id number] Date of Birth/Sex: Treating RN: 12-Jan-1946 (75 y.o. [name] Primary Care Nohea Kras: [name] Other Clinician: Referring Cherisa Brucker: Treating Juanangel Soderholm/Extender: Adele Schilder in Treatment: 39 Vital Signs Time Taken: 14:19 Temperature (F): 99.3 Height (in): 66 Pulse (bpm): 103 Weight (lbs): 245 Respiratory Rate (breaths/min): 18 Body Mass Index (BMI): 39.5 Blood Pressure (mmHg): 155/85 Reference Range: 80 - 120 mg / dl Electronic Signature(s) Signed: 10/17/2020 5:16:58 PM By: Levan Hurst RN, BSN Entered By: Levan Hurst on 10/16/2020 14:19:51

## 2020-10-22 ENCOUNTER — Telehealth: Payer: Self-pay | Admitting: Podiatry

## 2020-10-22 NOTE — Telephone Encounter (Signed)
Pt picked up custom shoes last week and was asking about getting a second pair of white tennis shoes that are custom as well.. per Barbaraann Rondo it would be 800.00 and I gave pt that information.  I did call the company and we could use the old mold to make another pair of shoes.   I left message for pt with this information and recommended pt try her current shoes that she picked up for a month or so before we order another pair.

## 2020-10-23 ENCOUNTER — Other Ambulatory Visit (HOSPITAL_COMMUNITY)
Admission: RE | Admit: 2020-10-23 | Discharge: 2020-10-23 | Disposition: A | Discharge status: Home / Self Care | Payer: Medicare Other

## 2020-10-23 ENCOUNTER — Encounter (HOSPITAL_BASED_OUTPATIENT_CLINIC_OR_DEPARTMENT_OTHER): Payer: Medicare Other | Admitting: Physician Assistant

## 2020-10-23 ENCOUNTER — Other Ambulatory Visit: Payer: Self-pay

## 2020-10-23 DIAGNOSIS — L97522 Non-pressure chronic ulcer of other part of left foot with fat layer exposed: Secondary | ICD-10-CM | POA: Diagnosis not present

## 2020-10-23 DIAGNOSIS — I10 Essential (primary) hypertension: Secondary | ICD-10-CM | POA: Diagnosis not present

## 2020-10-23 DIAGNOSIS — E11621 Type 2 diabetes mellitus with foot ulcer: Secondary | ICD-10-CM | POA: Diagnosis not present

## 2020-10-23 DIAGNOSIS — E1140 Type 2 diabetes mellitus with diabetic neuropathy, unspecified: Secondary | ICD-10-CM | POA: Diagnosis not present

## 2020-10-23 DIAGNOSIS — L03116 Cellulitis of left lower limb: Secondary | ICD-10-CM | POA: Insufficient documentation

## 2020-10-23 NOTE — Progress Notes (Addendum)
[name] [name], [name] [name] ([id number]) Visit Report for 10/23/2020 Chief Complaint Document Details Patient Name: Date of Service: [name] [name], [name] [name] 10/23/2020 1:45 PM Medical Record Number: [id number] Patient Account Number: [id number] Date of Birth/Sex: Treating RN: Jun 06, 1946 (75 y.o. [name] Other Clinician: Referring Provider: Treating Provider/Extender: Adele Schilder in Treatment: 40 Information Obtained from: Patient Chief Complaint Left foot ulcer Electronic Signature(s) Signed: 10/23/2020 2:00:17 PM By: Worthy Keeler PA-C Entered By: Worthy Keeler on 10/23/2020 14:00:17 -------------------------------------------------------------------------------- Debridement Details Patient Name: Date of Service: [name] Amato RA E. 10/23/2020 1:45 PM Medical Record Number: [id number] Patient Account Number: [id number] Date of Birth/Sex: Treating RN: 19-Apr-1946 (75 y.o. [name] Other Clinician: Referring Provider: Treating Provider/Extender: Adele Schilder in Treatment: 40 Debridement Performed for Assessment: Wound #1RRR Left,Medial,Plantar Foot Performed By: Physician Worthy Keeler, PA Debridement Type: Debridement Severity of Tissue Pre Debridement: Fat layer exposed Level of Consciousness (Pre-procedure): Awake and Alert Pre-procedure Verification/Time Out Yes - 15:05 Taken: Start Time: 15:07 Pain Control: Lidocaine 4% T opical Solution T Area Debrided (L x W): otal 1 (cm) x 2 (cm) = 2 (cm) Tissue and other material debrided: Viable, Non-Viable, Callus, Slough, Subcutaneous, Skin: Epidermis, Slough Level: Skin/Subcutaneous Tissue Debridement Description: Excisional Instrument: Curette Specimen: Swab, Number of Specimens T aken: 1 Bleeding: Minimum Hemostasis Achieved: Pressure End Time: 15:13 Procedural Pain: 0 Post Procedural Pain: 0 Response  to Treatment: Procedure was tolerated well Level of Consciousness (Post- Awake and Alert procedure): Post Debridement Measurements of Total Wound Length: (cm) 0.5 Width: (cm) 1.9 Depth: (cm) 0.2 Volume: (cm) 0.149 Character of Wound/Ulcer Post Debridement: Improved Severity of Tissue Post Debridement: Fat layer exposed Post Procedure Diagnosis Same as Pre-procedure Electronic Signature(s) Signed: 10/23/2020 6:22:43 PM By: Worthy Keeler PA-C Signed: 10/24/2020 5:44:19 PM By: [name] RN, BSN Entered By: [name] on 10/23/2020 15:14:10 -------------------------------------------------------------------------------- HPI Details Patient Name: Date of Service: [name] [name], [name] RA E. 10/23/2020 1:45 PM Medical Record Number: [id number] Patient Account Number: [id number] Date of Birth/Sex: Treating RN: 1945-10-18 (75 y.o. [name] Other Clinician: Referring Provider: Treating Provider/Extender: Adele Schilder in Treatment: 40 History of Present Illness HPI Description: 01/17/2020 upon evaluation today patient presents for initial evaluation here in our clinic concerning issues she has been having with a left medial/plantar foot ulcer. This is actually been an issue for her since October 2020. She has been seeing Dr. Doran Durand for quite some time during that course. Fortunately there is no signs of active infection at this time. Or least no mention of this to have seen in general. With that being said unfortunately I do see some signs of erythema noted today that does have me concerned about the possibility of infection at this point in the surrounding area of the wound. There is also a warm to touch at the site which is somewhat concerning. Fortunately there is no evidence of systemic infection which is great news. The patient does have a history of diabetes mellitus type 2, Charcot foot which is what led to the wound,  and hypertension. She notes that she was in a cast for some time with Dr. Doran Durand for about 8 weeks. During that time they were utilizing according to the patient silver nitrate along with a foam doughnut and then Coban to secure in place in the cast in place. With  that being said I do not have the actual records to review we are going to try to get a hold of those unfortunately they would not flow over into care everywhere I did look today. She has been seeing Dr. Doran Durand and his physician assistant Larkin Ina up until the end of May and apparently is still seeing them on a regular basis every 2 weeks roughly. She has also tried Iodosorb without effect here. 01/24/2020 upon evaluation today patient actually appears to be doing quite well with regard to her wounds. She has been tolerating the dressing changes without complication. Fortunately there is no signs of active infection spreading which is good news. Her culture did show signs of Staph aureus I did place her on Augmentin due to the erythema surrounding the wound. With that being said the wound does appear to be doing better she has her longer walking cast/boot and I think that is actually good for her for the time being. I am considering reinitiating total contact cast when she gets back from vacation but next week she will actually be out of town at [employer] she knows not to get in the water but she still obviously is planning to enjoy herself she is going to take it easy on her foot however. 02/07/2020 upon evaluation today patient appears to be doing fairly well in regard to her ulcer on her foot. Fortunately there is no signs of severe infection at this time which is great news and overall very pleased in that regard. With that being said I do think that she could still benefit from a total contact cast. Nonetheless she is using her walking boot which at least provide some protection and that it prevents some of the friction occurring when she is  ambulating. 02/14/2020 upon evaluation today patient appears to be doing well with regard to her foot ulcer. This is actually measuring a little bit smaller yet again this week. Overall very pleased with where things stand and I do not see any signs of active infection at this time which is also good news. Since she is measuring better the patient has wanting to somewhat hold off on proceeding with the total contact cast which I think is reasonable at this point. 02/28/2020 on evaluation today patient appears to be doing well in general in regard to her wound although she has a lot of callus buildup as compared to last time I saw her. This is can require sharp debridement today. I do believe she really needs the total contact cast as well which we have discussed previous. 7/23; patient comes in for a total contact cast change 03/06/2020 on evaluation today patient appears to be doing quite well with regard to her wounds. Fortunately the wound bed is measuring smaller and looking much better there is little callus noted although there is some debridement necessary today. 03/13/2020 on evaluation today patient's wound actually appears to be doing excellent which is great news. With that being said unfortunately she is having some issues currently with her left leg where she does have cellulitis it appears. This may have come from an area that rubbed underneath the cast from last week that we noted we padded that area and it looks to be doing excellent at this point but nonetheless the leg was somewhat painful, swollen, and somewhat erythematous. She also had an elevated white blood cell count of 11.5 based on what I saw on looking at her records from the med center in [place] [facility] from where she was seen yesterday. Unfortunately with [place] having a provider on vacation there was no one here in the clinic in the afternoon when she called therefore she went to the ER as advised. Subsequently they did not cut off the  cast as they did not have anyone from orthopedics there to do so and subsequently also did not have the ability to do the Doppler for evaluation of DVT They recommended therefore given her dose of Eliquis as well as . Augmentin and sent her home to come see [place] today to have the cast taken off and then she is supposed to go back to have the study for DVT performed they are following. 03/20/2020 upon evaluation today patient appears to be doing well with regard to her foot all things considered we have not been able to use the total contact cast due to the infection that she had last week. She has been on the doxycycline and she had a 10-day supply of that I do believe that is helping and her leg appears to be doing better. With that being said there is fortunately no signs of active infection systemically at this time which is good news. No fevers, chills, nausea, vomiting, or diarrhea. 03/27/2020 upon evaluation today patient appears to be doing well with regard to her foot ulcer. There does not appear to be signs of active infection which is great news. Overall I am very pleased with where things stand at this point. 04/03/2020 upon evaluation today patient appears to be doing pretty well in regard to the overall appearance of her wound. Fortunately there is no signs of active infection at this time which is great news. No fevers, chills, nausea, vomiting, or diarrhea. With that being said she does have some blue-green drainage that actually is a little bit concerning to me for the possibility of Pseudomonas. I discussed that with the patient today. With that being said I do believe that we may be able to manage this however with the topical antibiotic cream as opposed to having to do anything oral especially since she seems to be doing so well with overall appearance of the wound. 04/10/2020 on evaluation today patient appears to be doing about the same roughly in regard to the overall size of her  wound. With that being said she fortunately has not shown any signs of worsening overall which is good news. I do believe that she is doing a great job trying to offload but again she may still do better with the cast. I do not see in the blue-green drainage that we noticed previously I do believe the gentamicin help in this regard. 04/17/2020 on evaluation today patient's wound appears to be doing about the same at this point. There is no significant improvement at this point. No fever chills noted. She is up for put the cast back on the day. That she states in a couple weeks she will need to have this off to go to a workshop. 04/24/2020 on evaluation today patient appears to be doing significantly better in regard to her wound. Fortunately there is no signs of active infection and overall feel like she is making great progress the cast seems to have done excellent for her. 05/01/2020 upon evaluation today patient presents for reevaluation she really does not appear to be doing too badly in regard to the actual wound on the left foot we have been managing. Unfortunately she has bilateral lower extremity edema with blisters between the  webspace of her first and second toe on both feet. She has a tremendous amount of edema in the legs which I think is where this is coming from it does not appear to be infected but nonetheless I do believe this is can be something that needs to be addressed today. Obviously this means we probably will not be putting the cast on at this point. She attributes this to the fact that she was sitting with her feet on the floor much longer during a conference last week she had a great time but unfortunately had a lot of complications as a result. 05/08/2020 upon evaluation today patient appears to be doing somewhat better in regard to her wounds at this time. Fortunately there is no signs of active infection which is great news. With that being said I do believe that the blisters  have ruptured and unfortunately did not just reattach I will remove some of the blistered tissue today. With that being said I do think the wound itself on the plantar aspect of left foot does need to have sharp debridement. 05/15/2020 upon evaluation today patient appears to be doing about the same in regard to her foot ulcer. Unfortunately in the past week her husband had a fall where he sustained a mild traumatic brain bleed. Fortunately he is doing better but being that he was in the hospital she had a walk on this a lot more. The wound does not appear to be any better is also not really appearing to be significantly worse which is good news. There is no signs of active infection at this time. 10/14; patient with a small diabetic wound on the medial part of her left foot. We have been using silver collagen a total contact cast making good progress. I think the patient had a series of blisters on her dorsal foot probably secondary to having her legs recumbent for 3 days while in a conference in [place]. We wrapped her leg last week these are all healed. We did not previously have her in compression on the right leg. 05/29/2020 upon evaluation today patient appears to be doing well with regard to the wound on the plantar aspect of her foot medially. This is measuring smaller and looking much better than last time I saw her. Again when I did see her last was 2 weeks back and the wound was significantly larger. I do believe the cast is helping and I believe the collagen is a good option for her. 06/05/2020 on evaluation today patient appears to be doing well with regard to her foot ulcer this is actually measuring significantly better and overall I feel like she is doing excellent. There is no signs of active infection at this time. 06/12/2020 upon evaluation today patient actually continues to show signs of good improvement which is excellent news. I am extremely pleased with how she seems to be  progressing at this point in regard to her wound. There is still some depth to the wound but I do believe the collagen is helping her quite a bit. 06/19/2020 upon evaluation today patient appears to be doing well with regard to her plantar foot ulcer. She is actually making excellent progress and in fact this appears to be almost completely healed. With that being said I do believe that the patient is going to actually require 1 more week in the cast although after that I am hopeful she will be ready for discharge. 06/26/2020 on evaluation today patient appears to be  doing well in regard to her wound currently. Fortunately there is no signs of active infection in general I feel like she is doing excellent. This appears to be completely healed I think she is ready to come out of the cast. 11/29; patient comes back in the clinic today with a very quick reopening in the exact same area on the medial plantar foot. She had been healed out last time. She went back into some new balance shoes that she got at [employer] with a custom insert. As it turns out this wound also happened when wearing these shoes although there was some modification made I think with the wound initially happened they added some foam around the wound area. This obviously is not going to be sufficient. 07/17/2020 on evaluation today patient appears to be doing well with regard to her wound. Fortunately she seems to be making good progress. Unfortunately she was in the hospital due to a issue with colitis and had just been discharged today in fact. She tells me that her biggest concern here is that a lot of her numbers especially her creatinine were somewhat elevated and problematic. She is can be following up with her provider in order to have a further work-up at this time. With that being said she did need to come back in for a cast change. She did not allow them to remove the cast due to the fact that she did not feel like that was the  issue whatsoever and indeed it does not appear that was the case her wound appears to be doing excellent today. 07/24/2020 upon evaluation today patient appears to be doing well in regard to her foot ulcer. She still has a small opening but this is showing signs of excellent improvement overall but they were very close to complete resolution. No fevers, chills, nausea, vomiting, or diarrhea. 07/31/2020 upon evaluation today patient actually appears to be doing excellent she is actually completely healed this is great news. Fortunately there is no signs of active infection at this time. No fevers, chills, nausea, vomiting, or diarrhea. The patient tells me that she did see Dr. Paulla Dolly in order to get to [place] so that she can have a custom shoe made. She saw him earlier last week. She does have an appointment with Liliane Channel on the sixth I believe on January she tells me Readmission: 08/28/2020 on evaluation today patient appears to be doing poorly in regard to her wound currently. She tells me this has reopened. The good news that she did get measured for and actually her shoes are on the way in from [facility].. With that being said she has been trying to stay off of this is much as possible using her wheelchair around home. Nonetheless has been somewhat difficult. The good news is her shoes should be here next week 09/04/2020 upon evaluation today patient appears to be doing well with regard to her wound. There is a little bit of callus buildup but nothing too significant she does tell me she was very active this week. Fortunately there is no signs of systemic infection at this point. The dorsal foot wound actually appears to be doing better. I think this is healed. 09/11/2020 upon evaluation today patient appears to actually be doing quite well in my opinion based on what I am seeing today. Fortunately there is no signs of infection in fact her mother is certain that this is even open any longer based on  what I see. Fortunately  I think the patient has been doing everything she can to try to keep this under control. 09/18/2020 upon evaluation today patient appears to be doing about the same in regard to her foot ulcer. She has been tolerating the dressing changes without complication. Fortunately there is no sign of active infection at this time. No fevers, chills, nausea, vomiting, or diarrhea. She is going require some sharp debridement today. 09/25/2020 upon evaluation today patient appears to be doing well with regard to his wounds she has been tolerating the dressing changes without complication. Her wound appears to be completely healed based on what I am seeing at this point. There does not appear to be any signs of active infection at this time which is great news. No fevers, chills, nausea, vomiting, or diarrhea. She did get the cushion for her foot as well that she ordered to try to help keep pressure off of this area. That looks like it may be very beneficial for her to be honest. 10/02/2020 upon evaluation today patient appears to be doing more poorly in regard to her foot ulcer. Unfortunately this has reopened since we saw her last week. It apparently did not take too long at all for this to happen. She is obviously somewhat disappointed as she was hopeful that that would be time this thing would stay closed. Fortunately there is no evidence of active infection at this point. No fevers, chills, nausea, vomiting, or diarrhea. With that being said I am good have to perform a little bit of sharp debridement clear away some of the debris currently. Including a minimal amount of callus at this time. 10/16/2020 upon evaluation today patient appears to be doing about the same in regard to her foot ulcer unfortunately. There does not appear to be any signs of active infection which is great news. No fever chills noted she has been tolerating the dressing changes without complication which is great  news. Unfortunately she tells me that she has been very depressed about the situation is getting very frustrating to her that she continues to have issues despite everything that she is trying to do to stay off of her foot. She is extremely discouraged and I do hate to hear this. Obviously we have been hopeful that if she got her shoes that would make a difference she is actually can be getting those tomorrow but I am not certain that that alone is good to be enough to get this to heal. We may end up having to consider going back into a total contact cast to get this to heal and then subsequently once we get her healed get her into her new diabetic shoes which will and my hope anyway keep this from reopening again. 10/23/2020 upon evaluation today patient appears to be doing a little worse both in regard to the size of her wound as well as in regard to the fact that she has erythema surrounding the wound and wrap around the lateral part of her foot where she tells me has been somewhat sore. Fortunately there does not appear to be any evidence of infection systemically but locally definitely there is erythema and warmth consistent with local cellulitis. I am glad to remove some of the callus as well. Electronic Signature(s) Signed: 10/23/2020 3:22:40 PM By: Worthy Keeler PA-C Entered By: Worthy Keeler on 10/23/2020 15:22:40 -------------------------------------------------------------------------------- Physical Exam Details Patient Name: Date of Service: [name] [name], [name] RA E. 10/23/2020 1:45 PM Medical Record Number: [id number] Patient Account Number:  [id number] Date of Birth/Sex: Treating RN: November 25, 1945 (75 y.o. [name] Other Clinician: Referring Provider: Treating Provider/Extender: Landis Martins Weeks in Treatment: 30 Constitutional Well-nourished and well-hydrated in no acute distress. Respiratory normal breathing without  difficulty. Psychiatric this patient is able to make decisions and demonstrates good insight into disease process. Alert and Oriented x 3. pleasant and cooperative. Notes Upon inspection patient's wound bed actually showed signs of some slough noted on the surface of the wound there was some good granulation as well and definitely callus and had to be removed. I did perform sharp debridement to clear all this away today and she tolerated that without any significant pain. Electronic Signature(s) Signed: 10/23/2020 3:22:59 PM By: Worthy Keeler PA-C Entered By: Worthy Keeler on 10/23/2020 15:22:58 -------------------------------------------------------------------------------- Physician Orders Details Patient Name: Date of Service: [name] [name], [name] RA E. 10/23/2020 1:45 PM Medical Record Number: [id number] Patient Account Number: [id number] Date of Birth/Sex: Treating RN: 1946/06/30 (76 y.o. [name] Other Clinician: Referring Provider: Treating Provider/Extender: Adele Schilder in Treatment: 34 Verbal / Phone Orders: No Diagnosis Coding ICD-10 Coding Code Description E11.621 Type 2 diabetes mellitus with foot ulcer L97.522 Non-pressure chronic ulcer of other part of left foot with fat layer exposed M14.672 Charcot's joint, left ankle and foot E11.40 Type 2 diabetes mellitus with diabetic neuropathy, unspecified Follow-up Appointments Return Appointment in 1 week. Bathing/ Shower/ Hygiene May shower and wash wound with soap and water. - with dressing changes Off-Loading Open toe surgical shoe to: - left foot Additional Orders / Instructions Follow Nutritious Diet Wound Treatment Wound #1RRR - Foot Wound Laterality: Plantar, Left, Medial Cleanser: Wound Cleanser (DME) (Generic) Every Other Day/30 Days Discharge Instructions: Cleanse the wound with wound cleanser prior to applying a clean dressing using gauze sponges,  not tissue or cotton balls. Prim Dressing: KerraCel Ag Gelling Fiber Dressing, 2x2 in (silver alginate) (DME) (Generic) Every Other Day/30 Days ary Discharge Instructions: Apply silver alginate to wound bed as instructed Secondary Dressing: Woven Gauze Sponges 2x2 in Every Other Day/30 Days Discharge Instructions: Apply over primary dressing as directed. Secondary Dressing: Optifoam Non-Adhesive Dressing, 4x4 in (DME) (Generic) Every Other Day/30 Days Discharge Instructions: Apply over primary dressing cut to make foam donut Secured With: Kerlix Roll Sterile, 4.5x3.1 (in/yd) (DME) (Generic) Every Other Day/30 Days Discharge Instructions: Secure with Kerlix as directed. Secured With: 55M Medipore H Soft Cloth Surgical T 4 x 2 (in/yd) (DME) (Generic) Every Other Day/30 Days ape Discharge Instructions: Secure dressing with tape as directed. Laboratory naerobe culture (MICRO) - left plantar foot Bacteria identified in Unspecified specimen by A LOINC Code: 010-9 Convenience Name: Anerobic culture Patient Medications llergies: bee venom protein (honey bee), Sulfa (Sulfonamide Antibiotics), Farxiga A Notifications Medication Indication Start End 10/23/2020 doxycycline hyclate DOSE 1 - oral 100 mg capsule - 1 capsule oral taken 2 times per day for 14 days Electronic Signature(s) Signed: 10/23/2020 6:22:43 PM By: Worthy Keeler PA-C Signed: 10/29/2020 5:23:06 PM By: Rhae Hammock RN Previous Signature: 10/23/2020 3:25:21 PM Version By: Worthy Keeler PA-C Entered By: Rhae Hammock on 10/23/2020 17:20:18 -------------------------------------------------------------------------------- Problem List Details Patient Name: Date of Service: [name] Amato RA E. 10/23/2020 1:45 PM Medical Record Number: [id number] Patient Account Number: [id number] Date of Birth/Sex: Treating RN: Apr 25, 1946 (75 y.o. [name]: Other Clinician: [name] Referring  Provider: Treating Provider/Extender: Earlean Shawl, Nadine Counts  in Treatment: 40 Active Problems ICD-10 Encounter Code Description Active Date MDM Diagnosis E11.621 Type 2 diabetes mellitus with foot ulcer 01/17/2020 No Yes L97.522 Non-pressure chronic ulcer of other part of left foot with fat layer exposed 01/17/2020 No Yes M14.672 Charcot's joint, left ankle and foot 01/17/2020 No Yes E11.40 Type 2 diabetes mellitus with diabetic neuropathy, unspecified 01/17/2020 No Yes Inactive Problems ICD-10 Code Description Active Date Inactive Date I10 Essential (primary) hypertension 01/17/2020 01/17/2020 Resolved Problems Electronic Signature(s) Signed: 10/23/2020 2:00:11 PM By: Worthy Keeler PA-C Entered By: Worthy Keeler on 10/23/2020 14:00:10 -------------------------------------------------------------------------------- Progress Note Details Patient Name: Date of Service: [name] [name], [name] RA E. 10/23/2020 1:45 PM Medical Record Number: [id number] Patient Account Number: [id number] Date of Birth/Sex: Treating RN: 11/19/1945 (75 y.o. [name] Other Clinician: Referring Provider: Treating Provider/Extender: Adele Schilder in Treatment: 40 Subjective Chief Complaint Information obtained from Patient Left foot ulcer History of Present Illness (HPI) 01/17/2020 upon evaluation today patient presents for initial evaluation here in our clinic concerning issues she has been having with a left medial/plantar foot ulcer. This is actually been an issue for her since October 2020. She has been seeing Dr. Doran Durand for quite some time during that course. Fortunately there is no signs of active infection at this time. Or least no mention of this to have seen in general. With that being said unfortunately I do see some signs of erythema noted today that does have me concerned about the possibility of infection at this point in the  surrounding area of the wound. There is also a warm to touch at the site which is somewhat concerning. Fortunately there is no evidence of systemic infection which is great news. The patient does have a history of diabetes mellitus type 2, Charcot foot which is what led to the wound, and hypertension. She notes that she was in a cast for some time with Dr. Doran Durand for about 8 weeks. During that time they were utilizing according to the patient silver nitrate along with a foam doughnut and then Coban to secure in place in the cast in place. With that being said I do not have the actual records to review we are going to try to get a hold of those unfortunately they would not flow over into care everywhere I did look today. She has been seeing Dr. Doran Durand and his physician assistant Larkin Ina up until the end of May and apparently is still seeing them on a regular basis every 2 weeks roughly. She has also tried Iodosorb without effect here. 01/24/2020 upon evaluation today patient actually appears to be doing quite well with regard to her wounds. She has been tolerating the dressing changes without complication. Fortunately there is no signs of active infection spreading which is good news. Her culture did show signs of Staph aureus I did place her on Augmentin due to the erythema surrounding the wound. With that being said the wound does appear to be doing better she has her longer walking cast/boot and I think that is actually good for her for the time being. I am considering reinitiating total contact cast when she gets back from vacation but next week she will actually be out of town at [employer] she knows not to get in the water but she still obviously is planning to enjoy herself she is going to take it easy on her foot however. 02/07/2020 upon evaluation today patient appears to be  doing fairly well in regard to her ulcer on her foot. Fortunately there is no signs of severe infection at this time which is  great news and overall very pleased in that regard. With that being said I do think that she could still benefit from a total contact cast. Nonetheless she is using her walking boot which at least provide some protection and that it prevents some of the friction occurring when she is ambulating. 02/14/2020 upon evaluation today patient appears to be doing well with regard to her foot ulcer. This is actually measuring a little bit smaller yet again this week. Overall very pleased with where things stand and I do not see any signs of active infection at this time which is also good news. Since she is measuring better the patient has wanting to somewhat hold off on proceeding with the total contact cast which I think is reasonable at this point. 02/28/2020 on evaluation today patient appears to be doing well in general in regard to her wound although she has a lot of callus buildup as compared to last time I saw her. This is can require sharp debridement today. I do believe she really needs the total contact cast as well which we have discussed previous. 7/23; patient comes in for a total contact cast change 03/06/2020 on evaluation today patient appears to be doing quite well with regard to her wounds. Fortunately the wound bed is measuring smaller and looking much better there is little callus noted although there is some debridement necessary today. 03/13/2020 on evaluation today patient's wound actually appears to be doing excellent which is great news. With that being said unfortunately she is having some issues currently with her left leg where she does have cellulitis it appears. This may have come from an area that rubbed underneath the cast from last week that we noted we padded that area and it looks to be doing excellent at this point but nonetheless the leg was somewhat painful, swollen, and somewhat erythematous. She also had an elevated white blood cell count of 11.5 based on what I saw on looking  at her records from the med center in [facility] from where she was seen yesterday. Unfortunately with [place] having a provider on vacation there was no one here in the clinic in the afternoon when she called therefore she went to the ER as advised. Subsequently they did not cut off the cast as they did not have anyone from orthopedics there to do so and subsequently also did not have the ability to do the Doppler for evaluation of DVT They recommended therefore given her dose of Eliquis as well as . Augmentin and sent her home to come see [place] today to have the cast taken off and then she is supposed to go back to have the study for DVT performed they are following. 03/20/2020 upon evaluation today patient appears to be doing well with regard to her foot all things considered we have not been able to use the total contact cast due to the infection that she had last week. She has been on the doxycycline and she had a 10-day supply of that I do believe that is helping and her leg appears to be doing better. With that being said there is fortunately no signs of active infection systemically at this time which is good news. No fevers, chills, nausea, vomiting, or diarrhea. 03/27/2020 upon evaluation today patient appears to be doing well with regard to her foot  ulcer. There does not appear to be signs of active infection which is great news. Overall I am very pleased with where things stand at this point. 04/03/2020 upon evaluation today patient appears to be doing pretty well in regard to the overall appearance of her wound. Fortunately there is no signs of active infection at this time which is great news. No fevers, chills, nausea, vomiting, or diarrhea. With that being said she does have some blue-green drainage that actually is a little bit concerning to me for the possibility of Pseudomonas. I discussed that with the patient today. With that being said I do believe that we may be able to manage this  however with the topical antibiotic cream as opposed to having to do anything oral especially since she seems to be doing so well with overall appearance of the wound. 04/10/2020 on evaluation today patient appears to be doing about the same roughly in regard to the overall size of her wound. With that being said she fortunately has not shown any signs of worsening overall which is good news. I do believe that she is doing a great job trying to offload but again she may still do better with the cast. I do not see in the blue-green drainage that we noticed previously I do believe the gentamicin help in this regard. 04/17/2020 on evaluation today patient's wound appears to be doing about the same at this point. There is no significant improvement at this point. No fever chills noted. She is up for put the cast back on the day. That she states in a couple weeks she will need to have this off to go to a workshop. 04/24/2020 on evaluation today patient appears to be doing significantly better in regard to her wound. Fortunately there is no signs of active infection and overall feel like she is making great progress the cast seems to have done excellent for her. 05/01/2020 upon evaluation today patient presents for reevaluation she really does not appear to be doing too badly in regard to the actual wound on the left foot we have been managing. Unfortunately she has bilateral lower extremity edema with blisters between the webspace of her first and second toe on both feet. She has a tremendous amount of edema in the legs which I think is where this is coming from it does not appear to be infected but nonetheless I do believe this is can be something that needs to be addressed today. Obviously this means we probably will not be putting the cast on at this point. She attributes this to the fact that she was sitting with her feet on the floor much longer during a conference last week she had a great time but  unfortunately had a lot of complications as a result. 05/08/2020 upon evaluation today patient appears to be doing somewhat better in regard to her wounds at this time. Fortunately there is no signs of active infection which is great news. With that being said I do believe that the blisters have ruptured and unfortunately did not just reattach I will remove some of the blistered tissue today. With that being said I do think the wound itself on the plantar aspect of left foot does need to have sharp debridement. 05/15/2020 upon evaluation today patient appears to be doing about the same in regard to her foot ulcer. Unfortunately in the past week her husband had a fall where he sustained a mild traumatic brain bleed. Fortunately he is doing better  but being that he was in the hospital she had a walk on this a lot more. The wound does not appear to be any better is also not really appearing to be significantly worse which is good news. There is no signs of active infection at this time. 10/14; patient with a small diabetic wound on the medial part of her left foot. We have been using silver collagen a total contact cast making good progress. I think the patient had a series of blisters on her dorsal foot probably secondary to having her legs recumbent for 3 days while in a conference in [place]. We wrapped her leg last week these are all healed. We did not previously have her in compression on the right leg. 05/29/2020 upon evaluation today patient appears to be doing well with regard to the wound on the plantar aspect of her foot medially. This is measuring smaller and looking much better than last time I saw her. Again when I did see her last was 2 weeks back and the wound was significantly larger. I do believe the cast is helping and I believe the collagen is a good option for her. 06/05/2020 on evaluation today patient appears to be doing well with regard to her foot ulcer this is actually measuring  significantly better and overall I feel like she is doing excellent. There is no signs of active infection at this time. 06/12/2020 upon evaluation today patient actually continues to show signs of good improvement which is excellent news. I am extremely pleased with how she seems to be progressing at this point in regard to her wound. There is still some depth to the wound but I do believe the collagen is helping her quite a bit. 06/19/2020 upon evaluation today patient appears to be doing well with regard to her plantar foot ulcer. She is actually making excellent progress and in fact this appears to be almost completely healed. With that being said I do believe that the patient is going to actually require 1 more week in the cast although after that I am hopeful she will be ready for discharge. 06/26/2020 on evaluation today patient appears to be doing well in regard to her wound currently. Fortunately there is no signs of active infection in general I feel like she is doing excellent. This appears to be completely healed I think she is ready to come out of the cast. 11/29; patient comes back in the clinic today with a very quick reopening in the exact same area on the medial plantar foot. She had been healed out last time. She went back into some new balance shoes that she got at [employer] with a custom insert. As it turns out this wound also happened when wearing these shoes although there was some modification made I think with the wound initially happened they added some foam around the wound area. This obviously is not going to be sufficient. 07/17/2020 on evaluation today patient appears to be doing well with regard to her wound. Fortunately she seems to be making good progress. Unfortunately she was in the hospital due to a issue with colitis and had just been discharged today in fact. She tells me that her biggest concern here is that a lot of her numbers especially her creatinine were somewhat  elevated and problematic. She is can be following up with her provider in order to have a further work-up at this time. With that being said she did need to come back in for  a cast change. She did not allow them to remove the cast due to the fact that she did not feel like that was the issue whatsoever and indeed it does not appear that was the case her wound appears to be doing excellent today. 07/24/2020 upon evaluation today patient appears to be doing well in regard to her foot ulcer. She still has a small opening but this is showing signs of excellent improvement overall but they were very close to complete resolution. No fevers, chills, nausea, vomiting, or diarrhea. 07/31/2020 upon evaluation today patient actually appears to be doing excellent she is actually completely healed this is great news. Fortunately there is no signs of active infection at this time. No fevers, chills, nausea, vomiting, or diarrhea. The patient tells me that she did see Dr. Paulla Dolly in order to get to [place] so that she can have a custom shoe made. She saw him earlier last week. She does have an appointment with Liliane Channel on the sixth I believe on January she tells me Readmission: 08/28/2020 on evaluation today patient appears to be doing poorly in regard to her wound currently. She tells me this has reopened. The good news that she did get measured for and actually her shoes are on the way in from [facility].. With that being said she has been trying to stay off of this is much as possible using her wheelchair around home. Nonetheless has been somewhat difficult. The good news is her shoes should be here next week 09/04/2020 upon evaluation today patient appears to be doing well with regard to her wound. There is a little bit of callus buildup but nothing too significant she does tell me she was very active this week. Fortunately there is no signs of systemic infection at this point. The dorsal foot wound actually appears to  be doing better. I think this is healed. 09/11/2020 upon evaluation today patient appears to actually be doing quite well in my opinion based on what I am seeing today. Fortunately there is no signs of infection in fact her mother is certain that this is even open any longer based on what I see. Fortunately I think the patient has been doing everything she can to try to keep this under control. 09/18/2020 upon evaluation today patient appears to be doing about the same in regard to her foot ulcer. She has been tolerating the dressing changes without complication. Fortunately there is no sign of active infection at this time. No fevers, chills, nausea, vomiting, or diarrhea. She is going require some sharp debridement today. 09/25/2020 upon evaluation today patient appears to be doing well with regard to his wounds she has been tolerating the dressing changes without complication. Her wound appears to be completely healed based on what I am seeing at this point. There does not appear to be any signs of active infection at this time which is great news. No fevers, chills, nausea, vomiting, or diarrhea. She did get the cushion for her foot as well that she ordered to try to help keep pressure off of this area. That looks like it may be very beneficial for her to be honest. 10/02/2020 upon evaluation today patient appears to be doing more poorly in regard to her foot ulcer. Unfortunately this has reopened since we saw her last week. It apparently did not take too long at all for this to happen. She is obviously somewhat disappointed as she was hopeful that that would be time this thing would  stay closed. Fortunately there is no evidence of active infection at this point. No fevers, chills, nausea, vomiting, or diarrhea. With that being said I am good have to perform a little bit of sharp debridement clear away some of the debris currently. Including a minimal amount of callus at this time. 10/16/2020 upon  evaluation today patient appears to be doing about the same in regard to her foot ulcer unfortunately. There does not appear to be any signs of active infection which is great news. No fever chills noted she has been tolerating the dressing changes without complication which is great news. Unfortunately she tells me that she has been very depressed about the situation is getting very frustrating to her that she continues to have issues despite everything that she is trying to do to stay off of her foot. She is extremely discouraged and I do hate to hear this. Obviously we have been hopeful that if she got her shoes that would make a difference she is actually can be getting those tomorrow but I am not certain that that alone is good to be enough to get this to heal. We may end up having to consider going back into a total contact cast to get this to heal and then subsequently once we get her healed get her into her new diabetic shoes which will and my hope anyway keep this from reopening again. 10/23/2020 upon evaluation today patient appears to be doing a little worse both in regard to the size of her wound as well as in regard to the fact that she has erythema surrounding the wound and wrap around the lateral part of her foot where she tells me has been somewhat sore. Fortunately there does not appear to be any evidence of infection systemically but locally definitely there is erythema and warmth consistent with local cellulitis. I am glad to remove some of the callus as well. Objective Constitutional Well-nourished and well-hydrated in no acute distress. Vitals Time Taken: 1:56 PM, Height: 66 in, Weight: 245 lbs, BMI: 39.5, Temperature: 98.9 F, Pulse: 93 bpm, Respiratory Rate: 18 breaths/min, Blood Pressure: 152/82 mmHg. Respiratory normal breathing without difficulty. Psychiatric this patient is able to make decisions and demonstrates good insight into disease process. Alert and Oriented x 3.  pleasant and cooperative. General Notes: Upon inspection patient's wound bed actually showed signs of some slough noted on the surface of the wound there was some good granulation as well and definitely callus and had to be removed. I did perform sharp debridement to clear all this away today and she tolerated that without any significant pain. Integumentary (Hair, Skin) Wound #1RRR status is Open. Original cause of wound was Gradually Appeared. The date acquired was: 05/11/2019. The wound has been in treatment 40 weeks. The wound is located on the [place]. The wound measures 0.6cm length x 0.9cm width x 0.2cm depth; 0.424cm^2 area and 0.085cm^3 volume. There is Fat Layer (Subcutaneous Tissue) exposed. There is no tunneling or undermining noted. There is a medium amount of serosanguineous drainage noted. The wound margin is thickened. There is large (67-100%) pink granulation within the wound bed. There is no necrotic tissue within the wound bed. General Notes: callous periwound. Assessment Active Problems ICD-10 Type 2 diabetes mellitus with foot ulcer Non-pressure chronic ulcer of other part of left foot with fat layer exposed Charcot's joint, left ankle and foot Type 2 diabetes mellitus with diabetic neuropathy, unspecified Procedures Wound #1RRR Pre-procedure diagnosis of Wound #1RRR is a Diabetic  Wound/Ulcer of the Lower Extremity located on the [place] .Severity of Tissue Pre Debridement is: Fat layer exposed. There was a Excisional Skin/Subcutaneous Tissue Debridement with a total area of 2 sq cm performed by Worthy Keeler, PA. With the following instrument(s): Curette to remove Viable and Non-Viable tissue/material. Material removed includes Callus, Subcutaneous Tissue, Slough, and Skin: Epidermis after achieving pain control using Lidocaine 4% T opical Solution. 1 specimen was taken by a Swab and sent to the lab per facility protocol. A time out was  conducted at 15:05, prior to the start of the procedure. A Minimum amount of bleeding was controlled with Pressure. The procedure was tolerated well with a pain level of 0 throughout and a pain level of 0 following the procedure. Post Debridement Measurements: 0.5cm length x 1.9cm width x 0.2cm depth; 0.149cm^3 volume. Character of Wound/Ulcer Post Debridement is improved. Severity of Tissue Post Debridement is: Fat layer exposed. Post procedure Diagnosis Wound #1RRR: Same as Pre-Procedure Plan Follow-up Appointments: Return Appointment in 1 week. Bathing/ Shower/ Hygiene: May shower and wash wound with soap and water. - with dressing changes Off-Loading: Open toe surgical shoe to: - left foot Additional Orders / Instructions: Follow Nutritious Diet Laboratory ordered were: Anerobic culture - left plantar foot The following medication(s) was prescribed: doxycycline hyclate oral 100 mg capsule 1 1 capsule oral taken 2 times per day for 14 days starting 10/23/2020 WOUND #1RRR: - Foot Wound Laterality: Plantar, Left, Medial Prim Dressing: KerraCel Ag Gelling Fiber Dressing, 2x2 in (silver alginate) Every Other Day/30 Days ary Discharge Instructions: Apply silver alginate to wound bed as instructed Secondary Dressing: Woven Gauze Sponges 2x2 in Every Other Day/30 Days Discharge Instructions: Apply over primary dressing as directed. Secondary Dressing: Optifoam Non-Adhesive Dressing, 4x4 in (DME) (Dispense As Written) Every Other Day/30 Days Discharge Instructions: Apply over primary dressing cut to make foam donut Secured With: T egaderm Transparent Film Dressing 4x4.75 (in/in) Every Other Day/30 Days Discharge Instructions: Secure dressing with T egaderm as directed. 1. I would recommend currently that we continue with the silver alginate I think that still the best way to go. 2. I am also can recommend that we have the patient continue to use the offloading shoe for now. Again we talked  about putting the cast back on but she is actually getting ready to go on vacation on the 27th I believe she told me and therefore I think this is probably not the best time plus coupled with the fact that she shows sinus cellulitis today means that we definitely would have to hold off anyway. For that reason we will keep that in mind for hopefully when she gets back from her vacation. 3. Also can recommend that the patient continue to use the foam cut into a doughnut and then subsequently the T egaderm to secure in place that seems to have done best for her up to this point as far as keeping everything in place. 4. I did go ahead and obtain a wound culture today after debriding away the callus and slough from the surface of the wound. We will see what that shows as well. I placed her on doxycycline today depending on the results of the culture will change anything we need to as necessary. We will see patient back for reevaluation in 1 week here in the clinic. If anything worsens or changes patient will contact our office for additional recommendations. Electronic Signature(s) Signed: 10/23/2020 3:25:32 PM By: Worthy Keeler PA-C Previous Signature:  10/23/2020 3:24:11 PM Version By: Worthy Keeler PA-C Entered By: Worthy Keeler on 10/23/2020 15:25:32 -------------------------------------------------------------------------------- SuperBill Details Patient Name: Date of Service: [name] [name], [name] RA E. 10/23/2020 Medical Record Number: [id number] Patient Account Number: [id number] Date of Birth/Sex: Treating RN: 06/13/1946 (75 y.o. [name] Other Clinician: Referring Provider: Treating Provider/Extender: Adele Schilder in Treatment: 40 Diagnosis Coding ICD-10 Codes Code Description E11.621 Type 2 diabetes mellitus with foot ulcer L97.522 Non-pressure chronic ulcer of other part of left foot with fat layer exposed M14.672  Charcot's joint, left ankle and foot E11.40 Type 2 diabetes mellitus with diabetic neuropathy, unspecified Facility Procedures CPT4 Code: [id number] Description: 15830 - DEB SUBQ TISSUE 20 SQ CM/< ICD-10 Diagnosis Description L97.522 Non-pressure chronic ulcer of other part of left foot with fat layer exposed E11.621 Type 2 diabetes mellitus with foot ulcer Modifier: Quantity: 1 Physician Procedures : CPT4 Code Description Modifier [id number] - WC PHYS LEVEL 4 - EST PT 25 ICD-10 Diagnosis Description E11.621 Type 2 diabetes mellitus with foot ulcer L97.522 Non-pressure chronic ulcer of other part of left foot with fat layer exposed M14.672  Charcot's joint, left ankle and foot E11.40 Type 2 diabetes mellitus with diabetic neuropathy, unspecified Quantity: 1 : [id number] - WC PHYS SUBQ TISS 20 SQ CM ICD-10 Diagnosis Description L97.522 Non-pressure chronic ulcer of other part of left foot with fat layer exposed E11.621 Type 2 diabetes mellitus with foot ulcer Quantity: 1 Electronic Signature(s) Signed: 10/23/2020 3:25:45 PM By: Worthy Keeler PA-C Entered By: Worthy Keeler on 10/23/2020 15:25:44

## 2020-10-24 NOTE — Progress Notes (Signed)
DELBERTA, FOLTS ([id number]) Visit Report for 10/23/2020 Arrival Information Details Patient Name: Date of Service: [name] [name], [name] E. 10/23/2020 1:45 PM Medical Record Number: [id number] Patient Account Number: [id number] Date of Birth/Sex: Treating RN: Aug 01, 1946 (75 y.o. [name] [name] Primary Care [name] [name]: [name] [name] Other Clinician: Referring [name] [name]: Treating [name] [name]/Extender: [name] [name] in Treatment: 72 Visit Information History Since Last Visit Added or deleted any medications: No Patient Arrived: Wheel Chair Any new allergies or adverse reactions: No Arrival Time: 13:55 Had a fall or experienced change in No Accompanied By: self activities of daily living that may affect Transfer Assistance: None risk of falls: Patient Identification Verified: Yes Signs or symptoms of abuse/neglect since last visito No Secondary Verification Process Completed: Yes Hospitalized since last visit: No Patient Requires Transmission-Based Precautions: No Implantable device outside of the clinic excluding No Patient Has Alerts: No cellular tissue based products placed in the center since last visit: Has Dressing in Place as Prescribed: Yes Pain Present Now: No Electronic Signature(s) Signed: 10/24/2020 12:58:14 PM By: [name] [name] Entered By: [name] [name] -------------------------------------------------------------------------------- Encounter Discharge Information Details Patient Name: Date of Service: [name] [name] 10/23/2020 1:45 PM Medical Record Number: [id number] Patient Account Number: [id number] Date of Birth/Sex: Treating RN: 04-21-1946 (75 y.o. [name] [name] Primary Care [name] [name]: [name] [name] Other Clinician: Referring [name] [name]: Treating [name] [name]/Extender: [name] [name] in Treatment: 50 Encounter Discharge Information Items Post Procedure Vitals Discharge Condition:  Stable Temperature (F): 98.9 Ambulatory Status: Wheelchair Pulse (bpm): 93 Discharge Destination: Home Respiratory Rate (breaths/min): 17 Transportation: Private Auto Blood Pressure (mmHg): 152/82 Accompanied By: self Schedule Follow-up Appointment: Yes Clinical Summary of Care: Patient Declined Electronic Signature(s) Signed: 10/23/2020 5:17:38 PM By: [name] Hammock RN Entered By: [name] [name] on 10/23/2020 16:06:55 -------------------------------------------------------------------------------- Lower Extremity Assessment Details Patient Name: Date of Service: [name] [name] 10/23/2020 1:45 PM Medical Record Number: [id number] Patient Account Number: [id number] Date of Birth/Sex: Treating RN: April 06, 1946 (75 y.o. [name] [name] Primary Care [name] [name]: [name] [name] Other Clinician: Referring [name] [name]: Treating [name] [name]/Extender: [name] [name] in Treatment: 40 Edema Assessment Assessed: [Left: Yes] [Right: No] Edema: [Left: Ye] [Right: s] Calf Left: Right: Point of Measurement: 29 cm From Medial Instep 38 cm Ankle Left: Right: Point of Measurement: 12 cm From Medial Instep 24 cm Vascular Assessment Pulses: Dorsalis Pedis Palpable: [Left:Yes] Electronic Signature(s) Signed: 10/23/2020 4:41:44 PM By: [name] [name] Entered By: [name] [name] -------------------------------------------------------------------------------- Multi-Disciplinary Care Plan Details Patient Name: Date of Service: [name] [name] 10/23/2020 1:45 PM Medical Record Number: [id number] Patient Account Number: [id number] Date of Birth/Sex: Treating RN: March 20, 1946 (75 y.o. [name] [name] Primary Care [name] [name]: [name] [name] Other Clinician: Referring [name] [name]: Treating [name] [name]/Extender: [name] [name] in Treatment: [name] [name] reviewed with physician Active Inactive Nutrition Nursing  Diagnoses: Impaired glucose control: actual or potential Potential for alteratiion in Nutrition/Potential for imbalanced nutrition Goals: Patient/caregiver verbalizes understanding of need to maintain therapeutic glucose control per primary care physician Date Initiated: 01/17/2020 Date Inactivated: 02/14/2020 Target Resolution Date: 02/14/2020 Goal Status: Met Patient/caregiver will maintain therapeutic glucose control Date Initiated: 01/17/2020 Target Resolution Date: 11/20/2020 Goal Status: Active Interventions: Assess HgA1c results as ordered upon admission and as needed Assess patient nutrition upon admission and as needed per policy Provide education on elevated blood sugars and impact on wound healing Treatment Activities: Patient referred to Primary Care Physician for further nutritional  evaluation : 01/17/2020 Notes: Wound/Skin Impairment Nursing Diagnoses: Impaired tissue integrity Knowledge deficit related to ulceration/compromised skin integrity Goals: Patient/caregiver will verbalize understanding of skin care regimen Date Initiated: 01/17/2020 Target Resolution Date: 11/20/2020 Goal Status: Active Ulcer/skin breakdown will have a volume reduction of 30% by week 4 Date Initiated: 01/17/2020 Date Inactivated: 02/14/2020 Target Resolution Date: 02/14/2020 Goal Status: Met Ulcer/skin breakdown will have a volume reduction of 50% by week 8 Date Initiated: 02/14/2020 Date Inactivated: 03/13/2020 Target Resolution Date: 03/13/2020 Goal Status: Met Ulcer/skin breakdown will have a volume reduction of 80% by week 12 Date Initiated: 03/13/2020 Date Inactivated: 04/10/2020 Target Resolution Date: 04/10/2020 Goal Status: Unmet Unmet [name]: infection Interventions: Assess patient/caregiver ability to obtain necessary supplies Assess patient/caregiver ability to perform ulcer/skin care regimen upon admission and as needed Assess ulceration(s) every visit Provide education on ulcer and skin  care Treatment Activities: Skin care regimen initiated : 01/17/2020 Topical wound management initiated : 01/17/2020 Notes: Electronic Signature(s) Signed: 10/24/2020 5:44:19 PM By: [name] [name] Entered By: [name] [name] on 10/23/2020 15:01:16 -------------------------------------------------------------------------------- Pain Assessment Details Patient Name: Date of Service: [name] [name] 10/23/2020 1:45 PM Medical Record Number: [id number] Patient Account Number: [id number] Date of Birth/Sex: Treating RN: Sep 14, 1945 (75 y.o. [name] [name] Primary Care : [name] [name] Other Clinician: Referring : Treating /Extender: [name] [name] in Treatment: 54 Active Problems Location of Pain Severity and Description of Pain Patient Has Paino No Site Locations Pain Management and Medication Current Pain Management: Electronic Signature(s) Signed: 10/24/2020 12:58:14 PM By: [name] [name] Signed: 10/24/2020 5:44:19 PM By: [name] [name] Entered By: [name] [name] on 10/23/2020 13:57:11 -------------------------------------------------------------------------------- Patient/Caregiver Education Details Patient Name: Date of Service: [name] [name] 3/16/2022andnbsp1:45 PM Medical Record Number: [id number] Patient Account Number: [id number] Date of Birth/Gender: Treating RN: 07-13-1946 (75 y.o. [name] [name] Primary Care Physician: [name] [name] Other Clinician: Referring Physician: Treating Physician/Extender: [name] [name] in Treatment: 39 Education Assessment Education Provided To: Patient Education Topics Provided Elevated Blood Sugar/ Impact on Healing: Methods: Explain/Verbal Responses: Reinforcements needed, State content correctly Offloading: Methods: Explain/Verbal Responses: Reinforcements needed, State content correctly Wound/Skin Impairment: Methods:  Explain/Verbal Responses: Reinforcements needed, State content correctly Electronic Signature(s) Signed: 10/24/2020 5:44:19 PM By: [name] [name] Entered By: [name] [name] on 10/23/2020 15:02:03 -------------------------------------------------------------------------------- Wound Assessment Details Patient Name: Date of Service: [name] [name] 10/23/2020 1:45 PM Medical Record Number: [id number] Patient Account Number: [id number] Date of Birth/Sex: Treating RN: 1946/03/27 (75 y.o. [name] [name] Primary Care : [name] [name] Other Clinician: Referring : Treating /Extender: [name] [name] in Treatment: 40 Wound Status Wound Number: [id number] Primary Diabetic Wound/Ulcer of the Lower Extremity Etiology: Wound Location: Left, Medial, Plantar Foot Wound Open Wounding Event: Gradually Appeared Status: Date Acquired: 05/11/2019 Comorbid Sleep Apnea, Deep Vein Thrombosis, Hypertension, Colitis, Type [name] Of Treatment: 40 History: II Diabetes, Gout, Osteoarthritis, Neuropathy Clustered Wound: No Photos Wound Measurements Length: (cm) 0.6 Width: (cm) 0.9 Depth: (cm) 0.2 Area: (cm) 0.424 Volume: (cm) 0.085 % Reduction in Area: 58.5% % Reduction in Volume: 79.2% Epithelialization: Medium (34-66%) Tunneling: No Undermining: No Wound Description Classification: Grade 2 Wound Margin: Thickened Exudate Amount: Medium Exudate Type: Serosanguineous Exudate Color: red, brown Foul Odor After Cleansing: No Slough/Fibrino No Wound Bed Granulation Amount: Large (67-100%) Exposed Structure Granulation Quality: Pink Fascia Exposed: No Necrotic Amount: None Present (0%) Fat Layer (Subcutaneous Tissue) Exposed: Yes Tendon Exposed: No Muscle Exposed: No Joint  Exposed: No Bone Exposed: No Assessment Notes callous periwound. Treatment Notes Wound #1RRR (Foot) Wound Laterality: Plantar, Left, Medial Cleanser Peri-Wound  Care Topical Primary Dressing KerraCel Ag Gelling Fiber Dressing, 2x2 in (silver alginate) Discharge Instruction: Apply silver alginate to wound bed as instructed Secondary Dressing Woven Gauze Sponges 2x2 in Discharge Instruction: Apply over primary dressing as directed. Optifoam Non-Adhesive Dressing, 4x4 in Discharge Instruction: Apply over primary dressing cut to make foam donut Secured With Tegaderm Transparent Film Dressing 4x4.75 (in/in) Discharge Instruction: Secure dressing with Tegaderm as directed. Compression Wrap Compression Stockings Add-Ons Electronic Signature(s) Signed: 10/24/2020 12:58:14 PM By: [name] [name] Signed: 10/24/2020 5:44:19 PM By: [name] [name] Previous Signature: 10/23/2020 4:41:44 PM Version By: [name] [name] Entered By: [name] [name] on 10/23/2020 17:10:09 -------------------------------------------------------------------------------- Vitals Details Patient Name: Date of Service: [name] [name], [name] RA E. 10/23/2020 1:45 PM Medical Record Number: [id number] Patient Account Number: [id number] Date of Birth/Sex: Treating RN: 15-Feb-1946 (75 y.o. [name] [name] Primary Care : [name] [name] Other Clinician: Referring : Treating /Extender: [name] [name] in Treatment: 40 Vital Signs Time Taken: 13:56 Temperature (F): 98.9 Height (in): 66 Pulse (bpm): 93 Weight (lbs): 245 Respiratory Rate (breaths/min): 18 Body Mass Index (BMI): 39.5 Blood Pressure (mmHg): 152/82 Reference Range: 80 - 120 mg / dl Electronic Signature(s) Signed: 10/24/2020 12:58:14 PM By: [name] [name] Entered By: [name] [name] on 10/23/2020 13:57:06

## 2020-10-26 LAB — AEROBIC CULTURE W GRAM STAIN (SUPERFICIAL SPECIMEN)

## 2020-10-28 ENCOUNTER — Telehealth: Payer: Self-pay | Admitting: Podiatry

## 2020-10-28 NOTE — Telephone Encounter (Signed)
Pt  message stating that she is not sure about the custom shoe she picked up. Not sure it is fitting well. She was going to order an additional pair and did receive the message I left her(Thank you) but wants an appt with Liliane Channel.   I returned call and left message that Liliane Channel is no longer with the practice but could get pt in to see EJ this Friday at 200 and to call me to let me know.

## 2020-10-29 ENCOUNTER — Encounter: Payer: Self-pay | Admitting: Internal Medicine

## 2020-10-30 ENCOUNTER — Other Ambulatory Visit: Payer: Self-pay

## 2020-10-30 ENCOUNTER — Encounter (HOSPITAL_BASED_OUTPATIENT_CLINIC_OR_DEPARTMENT_OTHER): Payer: Medicare Other | Admitting: Physician Assistant

## 2020-10-30 DIAGNOSIS — E1140 Type 2 diabetes mellitus with diabetic neuropathy, unspecified: Secondary | ICD-10-CM | POA: Diagnosis not present

## 2020-10-30 DIAGNOSIS — I10 Essential (primary) hypertension: Secondary | ICD-10-CM | POA: Diagnosis not present

## 2020-10-30 DIAGNOSIS — L97522 Non-pressure chronic ulcer of other part of left foot with fat layer exposed: Secondary | ICD-10-CM | POA: Diagnosis not present

## 2020-10-30 DIAGNOSIS — E11621 Type 2 diabetes mellitus with foot ulcer: Secondary | ICD-10-CM | POA: Diagnosis not present

## 2020-10-30 MED ORDER — FLUCONAZOLE 150 MG PO TABS: 150.0000 mg | ORAL_TABLET | Freq: Once | ORAL | 0 refills | Status: AC

## 2020-10-30 NOTE — Progress Notes (Addendum)
[name] [name], [name] [name] ([id number]) Visit Report for 10/30/2020 Chief Complaint Document Details Patient Name: Date of Service: [name] [name], [name] E. 10/30/2020 2:45 PM Medical Record Number: [id number] Patient Account Number: [id number] Date of Birth/Sex: Treating RN: [birth year] (75 y.o. [name] Other Clinician: Referring Provider: Treating Provider/Extender: Adele Schilder in Treatment: 23 Information Obtained from: Patient Chief Complaint Left foot ulcer Electronic Signature(s) Signed: 10/30/2020 3:27:00 PM By: Worthy Keeler PA-C Entered By: Worthy Keeler on 10/30/2020 15:26:59 -------------------------------------------------------------------------------- Debridement Details Patient Name: Date of Service: [name] Amato RA E. 10/30/2020 2:45 PM Medical Record Number: [id number] Patient Account Number: [id number] Date of Birth/Sex: Treating RN: 11-09-1945 (75 y.o. [name] Other Clinician: Referring Provider: Treating Provider/Extender: Adele Schilder in Treatment: 41 Debridement Performed for Assessment: Wound #1RRR Left,Medial,Plantar Foot Performed By: Physician Worthy Keeler, PA Debridement Type: Debridement Severity of Tissue Pre Debridement: Fat layer exposed Level of Consciousness (Pre-procedure): Awake and Alert Pre-procedure Verification/Time Out Yes - 15:28 Taken: Start Time: 15:29 Pain Control: Lidocaine 4% T opical Solution T Area Debrided (L x W): otal 2 (cm) x 2 (cm) = 4 (cm) Tissue and other material debrided: Viable, Non-Viable, Callus, Slough, Subcutaneous, Skin: Epidermis, Slough Level: Skin/Subcutaneous Tissue Debridement Description: Excisional Instrument: Curette Bleeding: Minimum Hemostasis Achieved: Pressure End Time: 15:35 Procedural Pain: 0 Post Procedural Pain: 0 Response to Treatment: Procedure was tolerated  well Level of Consciousness (Post- Awake and Alert procedure): Post Debridement Measurements of Total Wound Length: (cm) 0.3 Width: (cm) 0.3 Depth: (cm) 0.1 Volume: (cm) 0.007 Character of Wound/Ulcer Post Debridement: Improved Severity of Tissue Post Debridement: Fat layer exposed Post Procedure Diagnosis Same as Pre-procedure Electronic Signature(s) Signed: 10/30/2020 5:29:52 PM By: Worthy Keeler PA-C Signed: 10/30/2020 5:47:10 PM By: [name] RN, BSN Entered By: [name] on 10/30/2020 15:33:33 -------------------------------------------------------------------------------- HPI Details Patient Name: Date of Service: [name] [name] RBA RA E. 10/30/2020 2:45 PM Medical Record Number: [id number] Patient Account Number: [id number] Date of Birth/Sex: Treating RN: 07/25/1946 (75 y.o. [name] Other Clinician: Referring Provider: Treating Provider/Extender: Adele Schilder in Treatment: 77 History of Present Illness HPI Description: 01/17/2020 upon evaluation today patient presents for initial evaluation here in our clinic concerning issues she has been having with a left medial/plantar foot ulcer. This is actually been an issue for her since October 2020. She has been seeing Dr. Doran Durand for quite some time during that course. Fortunately there is no signs of active infection at this time. Or least no mention of this to have seen in general. With that being said unfortunately I do see some signs of erythema noted today that does have me concerned about the possibility of infection at this point in the surrounding area of the wound. There is also a warm to touch at the site which is somewhat concerning. Fortunately there is no evidence of systemic infection which is great news. The patient does have a history of diabetes mellitus type 2, Charcot foot which is what led to the wound, and hypertension. She notes that she was  in a cast for some time with Dr. Doran Durand for about 8 weeks. During that time they were utilizing according to the patient silver nitrate along with a foam doughnut and then Coban to secure in place in the cast in place. With that being said I do not have the  actual records to review we are going to try to get a hold of those unfortunately they would not flow over into care everywhere I did look today. She has been seeing Dr. Doran Durand and his physician assistant Larkin Ina up until the end of May and apparently is still seeing them on a regular basis every 2 weeks roughly. She has also tried Iodosorb without effect here. 01/24/2020 upon evaluation today patient actually appears to be doing quite well with regard to her wounds. She has been tolerating the dressing changes without complication. Fortunately there is no signs of active infection spreading which is good news. Her culture did show signs of Staph aureus I did place her on Augmentin due to the erythema surrounding the wound. With that being said the wound does appear to be doing better she has her longer walking cast/boot and I think that is actually good for her for the time being. I am considering reinitiating total contact cast when she gets back from vacation but next week she will actually be out of town at [employer] she knows not to get in the water but she still obviously is planning to enjoy herself she is going to take it easy on her foot however. 02/07/2020 upon evaluation today patient appears to be doing fairly well in regard to her ulcer on her foot. Fortunately there is no signs of severe infection at this time which is great news and overall very pleased in that regard. With that being said I do think that she could still benefit from a total contact cast. Nonetheless she is using her walking boot which at least provide some protection and that it prevents some of the friction occurring when she is ambulating. 02/14/2020 upon evaluation  today patient appears to be doing well with regard to her foot ulcer. This is actually measuring a little bit smaller yet again this week. Overall very pleased with where things stand and I do not see any signs of active infection at this time which is also good news. Since she is measuring better the patient has wanting to somewhat hold off on proceeding with the total contact cast which I think is reasonable at this point. 02/28/2020 on evaluation today patient appears to be doing well in general in regard to her wound although she has a lot of callus buildup as compared to last time I saw her. This is can require sharp debridement today. I do believe she really needs the total contact cast as well which we have discussed previous. 7/23; patient comes in for a total contact cast change 03/06/2020 on evaluation today patient appears to be doing quite well with regard to her wounds. Fortunately the wound bed is measuring smaller and looking much better there is little callus noted although there is some debridement necessary today. 03/13/2020 on evaluation today patient's wound actually appears to be doing excellent which is great news. With that being said unfortunately she is having some issues currently with her left leg where she does have cellulitis it appears. This may have come from an area that rubbed underneath the cast from last week that we noted we padded that area and it looks to be doing excellent at this point but nonetheless the leg was somewhat painful, swollen, and somewhat erythematous. She also had an elevated white blood cell count of 11.5 based on what I saw on looking at her records from the med center in [facility] from where she was seen yesterday. Unfortunately  with [place] having a provider on vacation there was no one here in the clinic in the afternoon when she called therefore she went to the ER as advised. Subsequently they did not cut off the cast as they did not have anyone from  orthopedics there to do so and subsequently also did not have the ability to do the Doppler for evaluation of DVT They recommended therefore given her dose of Eliquis as well as . Augmentin and sent her home to come see [place] today to have the cast taken off and then she is supposed to go back to have the study for DVT performed they are following. 03/20/2020 upon evaluation today patient appears to be doing well with regard to her foot all things considered we have not been able to use the total contact cast due to the infection that she had last week. She has been on the doxycycline and she had a 10-day supply of that I do believe that is helping and her leg appears to be doing better. With that being said there is fortunately no signs of active infection systemically at this time which is good news. No fevers, chills, nausea, vomiting, or diarrhea. 03/27/2020 upon evaluation today patient appears to be doing well with regard to her foot ulcer. There does not appear to be signs of active infection which is great news. Overall I am very pleased with where things stand at this point. 04/03/2020 upon evaluation today patient appears to be doing pretty well in regard to the overall appearance of her wound. Fortunately there is no signs of active infection at this time which is great news. No fevers, chills, nausea, vomiting, or diarrhea. With that being said she does have some blue-green drainage that actually is a little bit concerning to me for the possibility of Pseudomonas. I discussed that with the patient today. With that being said I do believe that we may be able to manage this however with the topical antibiotic cream as opposed to having to do anything oral especially since she seems to be doing so well with overall appearance of the wound. 04/10/2020 on evaluation today patient appears to be doing about the same roughly in regard to the overall size of her wound. With that being said  she fortunately has not shown any signs of worsening overall which is good news. I do believe that she is doing a great job trying to offload but again she may still do better with the cast. I do not see in the blue-green drainage that we noticed previously I do believe the gentamicin help in this regard. 04/17/2020 on evaluation today patient's wound appears to be doing about the same at this point. There is no significant improvement at this point. No fever chills noted. She is up for put the cast back on the day. That she states in a couple weeks she will need to have this off to go to a workshop. 04/24/2020 on evaluation today patient appears to be doing significantly better in regard to her wound. Fortunately there is no signs of active infection and overall feel like she is making great progress the cast seems to have done excellent for her. 05/01/2020 upon evaluation today patient presents for reevaluation she really does not appear to be doing too badly in regard to the actual wound on the left foot we have been managing. Unfortunately she has bilateral lower extremity edema with blisters between the webspace of her first and second toe on  both feet. She has a tremendous amount of edema in the legs which I think is where this is coming from it does not appear to be infected but nonetheless I do believe this is can be something that needs to be addressed today. Obviously this means we probably will not be putting the cast on at this point. She attributes this to the fact that she was sitting with her feet on the floor much longer during a conference last week she had a great time but unfortunately had a lot of complications as a result. 05/08/2020 upon evaluation today patient appears to be doing somewhat better in regard to her wounds at this time. Fortunately there is no signs of active infection which is great news. With that being said I do believe that the blisters have ruptured and  unfortunately did not just reattach I will remove some of the blistered tissue today. With that being said I do think the wound itself on the plantar aspect of left foot does need to have sharp debridement. 05/15/2020 upon evaluation today patient appears to be doing about the same in regard to her foot ulcer. Unfortunately in the past week her husband had a fall where he sustained a mild traumatic brain bleed. Fortunately he is doing better but being that he was in the hospital she had a walk on this a lot more. The wound does not appear to be any better is also not really appearing to be significantly worse which is good news. There is no signs of active infection at this time. 10/14; patient with a small diabetic wound on the medial part of her left foot. We have been using silver collagen a total contact cast making good progress. I think the patient had a series of blisters on her dorsal foot probably secondary to having her legs recumbent for 3 days while in a conference in [place]. We wrapped her leg last week these are all healed. We did not previously have her in compression on the right leg. 05/29/2020 upon evaluation today patient appears to be doing well with regard to the wound on the plantar aspect of her foot medially. This is measuring smaller and looking much better than last time I saw her. Again when I did see her last was 2 weeks back and the wound was significantly larger. I do believe the cast is helping and I believe the collagen is a good option for her. 06/05/2020 on evaluation today patient appears to be doing well with regard to her foot ulcer this is actually measuring significantly better and overall I feel like she is doing excellent. There is no signs of active infection at this time. 06/12/2020 upon evaluation today patient actually continues to show signs of good improvement which is excellent news. I am extremely pleased with how she seems to be progressing at this  point in regard to her wound. There is still some depth to the wound but I do believe the collagen is helping her quite a bit. 06/19/2020 upon evaluation today patient appears to be doing well with regard to her plantar foot ulcer. She is actually making excellent progress and in fact this appears to be almost completely healed. With that being said I do believe that the patient is going to actually require 1 more week in the cast although after that I am hopeful she will be ready for discharge. 06/26/2020 on evaluation today patient appears to be doing well in regard to her wound currently.  Fortunately there is no signs of active infection in general I feel like she is doing excellent. This appears to be completely healed I think she is ready to come out of the cast. 11/29; patient comes back in the clinic today with a very quick reopening in the exact same area on the medial plantar foot. She had been healed out last time. She went back into some new balance shoes that she got at [employer] with a custom insert. As it turns out this wound also happened when wearing these shoes although there was some modification made I think with the wound initially happened they added some foam around the wound area. This obviously is not going to be sufficient. 07/17/2020 on evaluation today patient appears to be doing well with regard to her wound. Fortunately she seems to be making good progress. Unfortunately she was in the hospital due to a issue with colitis and had just been discharged today in fact. She tells me that her biggest concern here is that a lot of her numbers especially her creatinine were somewhat elevated and problematic. She is can be following up with her provider in order to have a further work-up at this time. With that being said she did need to come back in for a cast change. She did not allow them to remove the cast due to the fact that she did not feel like that was the issue whatsoever and  indeed it does not appear that was the case her wound appears to be doing excellent today. 07/24/2020 upon evaluation today patient appears to be doing well in regard to her foot ulcer. She still has a small opening but this is showing signs of excellent improvement overall but they were very close to complete resolution. No fevers, chills, nausea, vomiting, or diarrhea. 07/31/2020 upon evaluation today patient actually appears to be doing excellent she is actually completely healed this is great news. Fortunately there is no signs of active infection at this time. No fevers, chills, nausea, vomiting, or diarrhea. The patient tells me that she did see Dr. Paulla Dolly in order to get to [place] so that she can have a custom shoe made. She saw him earlier last week. She does have an appointment with Liliane Channel on the sixth I believe on January she tells me Readmission: 08/28/2020 on evaluation today patient appears to be doing poorly in regard to her wound currently. She tells me this has reopened. The good news that she did get measured for and actually her shoes are on the way in from [facility].. With that being said she has been trying to stay off of this is much as possible using her wheelchair around home. Nonetheless has been somewhat difficult. The good news is her shoes should be here next week 09/04/2020 upon evaluation today patient appears to be doing well with regard to her wound. There is a little bit of callus buildup but nothing too significant she does tell me she was very active this week. Fortunately there is no signs of systemic infection at this point. The dorsal foot wound actually appears to be doing better. I think this is healed. 09/11/2020 upon evaluation today patient appears to actually be doing quite well in my opinion based on what I am seeing today. Fortunately there is no signs of infection in fact her mother is certain that this is even open any longer based on what I see. Fortunately  I think the patient has been doing everything  she can to try to keep this under control. 09/18/2020 upon evaluation today patient appears to be doing about the same in regard to her foot ulcer. She has been tolerating the dressing changes without complication. Fortunately there is no sign of active infection at this time. No fevers, chills, nausea, vomiting, or diarrhea. She is going require some sharp debridement today. 09/25/2020 upon evaluation today patient appears to be doing well with regard to his wounds she has been tolerating the dressing changes without complication. Her wound appears to be completely healed based on what I am seeing at this point. There does not appear to be any signs of active infection at this time which is great news. No fevers, chills, nausea, vomiting, or diarrhea. She did get the cushion for her foot as well that she ordered to try to help keep pressure off of this area. That looks like it may be very beneficial for her to be honest. 10/02/2020 upon evaluation today patient appears to be doing more poorly in regard to her foot ulcer. Unfortunately this has reopened since we saw her last week. It apparently did not take too long at all for this to happen. She is obviously somewhat disappointed as she was hopeful that that would be time this thing would stay closed. Fortunately there is no evidence of active infection at this point. No fevers, chills, nausea, vomiting, or diarrhea. With that being said I am good have to perform a little bit of sharp debridement clear away some of the debris currently. Including a minimal amount of callus at this time. 10/16/2020 upon evaluation today patient appears to be doing about the same in regard to her foot ulcer unfortunately. There does not appear to be any signs of active infection which is great news. No fever chills noted she has been tolerating the dressing changes without complication which is great news. Unfortunately she tells  me that she has been very depressed about the situation is getting very frustrating to her that she continues to have issues despite everything that she is trying to do to stay off of her foot. She is extremely discouraged and I do hate to hear this. Obviously we have been hopeful that if she got her shoes that would make a difference she is actually can be getting those tomorrow but I am not certain that that alone is good to be enough to get this to heal. We may end up having to consider going back into a total contact cast to get this to heal and then subsequently once we get her healed get her into her new diabetic shoes which will and my hope anyway keep this from reopening again. 10/23/2020 upon evaluation today patient appears to be doing a little worse both in regard to the size of her wound as well as in regard to the fact that she has erythema surrounding the wound and wrap around the lateral part of her foot where she tells me has been somewhat sore. Fortunately there does not appear to be any evidence of infection systemically but locally definitely there is erythema and warmth consistent with local cellulitis. I am glad to remove some of the callus as well. 10/30/2020 upon evaluation today patient appears to be doing well with regard to her wound on the plantar aspect of her foot. This is significantly improved compared to last week. 10/30/2020 upon evaluation today patient actually is making excellent progress her wound appears to show signs of great improvement which is  wonderful and that she is extremely pleased to hear this. She actually leaves Sunday to go on her trip with her half sister and friends. Electronic Signature(s) Signed: 10/30/2020 5:21:14 PM By: Worthy Keeler PA-C Entered By: Worthy Keeler on 10/30/2020 17:21:14 -------------------------------------------------------------------------------- Physical Exam Details Patient Name: Date of Service: [name] [name], [name] RA E.  10/30/2020 2:45 PM Medical Record Number: [id number] Patient Account Number: [id number] Date of Birth/Sex: Treating RN: 03-08-1946 (75 y.o. [name] Other Clinician: Referring Provider: Treating Provider/Extender: Adele Schilder in Treatment: 41 Constitutional Well-nourished and well-hydrated in no acute distress. Respiratory normal breathing without difficulty. Psychiatric this patient is able to make decisions and demonstrates good insight into disease process. Alert and Oriented x 3. pleasant and cooperative. Notes Upon inspection patient's wound bed actually showed signs of good granulation epithelization at this point. There does not appear to be any sign of infection which is great news and overall very pleased with where things stand today. I did perform some sharp debridement to clear away some callus although this really did not worsen or change or increase the size of the wound overall I feel like it is doing quite well. She is still taking the antibiotics. Electronic Signature(s) Signed: 10/30/2020 5:21:45 PM By: Worthy Keeler PA-C Entered By: Worthy Keeler on 10/30/2020 17:21:45 -------------------------------------------------------------------------------- Physician Orders Details Patient Name: Date of Service: [name] [name], [name] RA E. 10/30/2020 2:45 PM Medical Record Number: [id number] Patient Account Number: [id number] Date of Birth/Sex: Treating RN: 02/11/1946 (75 y.o. [name] Other Clinician: Referring Provider: Treating Provider/Extender: Adele Schilder in Treatment: 42 Verbal / Phone Orders: No Diagnosis Coding ICD-10 Coding Code Description E11.621 Type 2 diabetes mellitus with foot ulcer L97.522 Non-pressure chronic ulcer of other part of left foot with fat layer exposed M14.672 Charcot's joint, left ankle and foot E11.40 Type 2  diabetes mellitus with diabetic neuropathy, unspecified Follow-up Appointments Return Appointment in 2 weeks. Bathing/ Shower/ Hygiene May shower and wash wound with soap and water. - with dressing changes Off-Loading Open toe surgical shoe to: - left foot Other: - minimal weight bearing left foot Additional Orders / Instructions Follow Nutritious Diet Wound Treatment Wound #1RRR - Foot Wound Laterality: Plantar, Left, Medial Cleanser: Wound Cleanser (Generic) Every Other Day/30 Days Discharge Instructions: Cleanse the wound with wound cleanser prior to applying a clean dressing using gauze sponges, not tissue or cotton balls. Prim Dressing: KerraCel Ag Gelling Fiber Dressing, 2x2 in (silver alginate) (Generic) Every Other Day/30 Days ary Discharge Instructions: Apply silver alginate to wound bed as instructed Secondary Dressing: Woven Gauze Sponges 2x2 in Every Other Day/30 Days Discharge Instructions: Apply over primary dressing as directed. Secondary Dressing: Optifoam Non-Adhesive Dressing, 4x4 in (Generic) Every Other Day/30 Days Discharge Instructions: Apply over primary dressing cut to make foam donut Secured With: Kerlix Roll Sterile, 4.5x3.1 (in/yd) (Generic) Every Other Day/30 Days Discharge Instructions: Secure with Kerlix as directed. Secured With: 56M Medipore H Soft Cloth Surgical T 4 x 2 (in/yd) (Generic) Every Other Day/30 Days ape Discharge Instructions: Secure dressing with tape as directed. Electronic Signature(s) Signed: 10/30/2020 5:29:52 PM By: Worthy Keeler PA-C Signed: 10/30/2020 5:47:10 PM By: [name] RN, BSN Entered By: [name] on 10/30/2020 15:35:50 -------------------------------------------------------------------------------- Problem List Details Patient Name: Date of Service: [name] [name] RBA RA E. 10/30/2020 2:45 PM Medical Record Number: [id number] Patient Account Number: [id number] Date of Birth/Sex: Treating  RN: Nov 02, 1945 (75 y.o. [name] Other Clinician: Referring Provider: Treating Provider/Extender: Adele Schilder in Treatment: 74 Active Problems ICD-10 Encounter Code Description Active Date MDM Diagnosis E11.621 Type 2 diabetes mellitus with foot ulcer 01/17/2020 No Yes L97.522 Non-pressure chronic ulcer of other part of left foot with fat layer exposed 01/17/2020 No Yes M14.672 Charcot's joint, left ankle and foot 01/17/2020 No Yes E11.40 Type 2 diabetes mellitus with diabetic neuropathy, unspecified 01/17/2020 No Yes Inactive Problems ICD-10 Code Description Active Date Inactive Date I10 Essential (primary) hypertension 01/17/2020 01/17/2020 Resolved Problems Electronic Signature(s) Signed: 10/30/2020 3:26:52 PM By: Worthy Keeler PA-C Entered By: Worthy Keeler on 10/30/2020 15:26:52 -------------------------------------------------------------------------------- Progress Note Details Patient Name: Date of Service: [name] [name], [name] RA E. 10/30/2020 2:45 PM Medical Record Number: [id number] Patient Account Number: [id number] Date of Birth/Sex: Treating RN: [birth year] (75 y.o. [name] Other Clinician: Referring Provider: Treating Provider/Extender: Adele Schilder in Treatment: 74 Subjective Chief Complaint Information obtained from Patient Left foot ulcer History of Present Illness (HPI) 01/17/2020 upon evaluation today patient presents for initial evaluation here in our clinic concerning issues she has been having with a left medial/plantar foot ulcer. This is actually been an issue for her since October 2020. She has been seeing Dr. Doran Durand for quite some time during that course. Fortunately there is no signs of active infection at this time. Or least no mention of this to have seen in general. With that being said unfortunately I do see some signs of erythema noted today that does  have me concerned about the possibility of infection at this point in the surrounding area of the wound. There is also a warm to touch at the site which is somewhat concerning. Fortunately there is no evidence of systemic infection which is great news. The patient does have a history of diabetes mellitus type 2, Charcot foot which is what led to the wound, and hypertension. She notes that she was in a cast for some time with Dr. Doran Durand for about 8 weeks. During that time they were utilizing according to the patient silver nitrate along with a foam doughnut and then Coban to secure in place in the cast in place. With that being said I do not have the actual records to review we are going to try to get a hold of those unfortunately they would not flow over into care everywhere I did look today. She has been seeing Dr. Doran Durand and his physician assistant Larkin Ina up until the end of May and apparently is still seeing them on a regular basis every 2 weeks roughly. She has also tried Iodosorb without effect here. 01/24/2020 upon evaluation today patient actually appears to be doing quite well with regard to her wounds. She has been tolerating the dressing changes without complication. Fortunately there is no signs of active infection spreading which is good news. Her culture did show signs of Staph aureus I did place her on Augmentin due to the erythema surrounding the wound. With that being said the wound does appear to be doing better she has her longer walking cast/boot and I think that is actually good for her for the time being. I am considering reinitiating total contact cast when she gets back from vacation but next week she will actually be out of town at [employer] she knows not to get in the water but she still  obviously is planning to enjoy herself she is going to take it easy on her foot however. 02/07/2020 upon evaluation today patient appears to be doing fairly well in regard to her ulcer on her  foot. Fortunately there is no signs of severe infection at this time which is great news and overall very pleased in that regard. With that being said I do think that she could still benefit from a total contact cast. Nonetheless she is using her walking boot which at least provide some protection and that it prevents some of the friction occurring when she is ambulating. 02/14/2020 upon evaluation today patient appears to be doing well with regard to her foot ulcer. This is actually measuring a little bit smaller yet again this week. Overall very pleased with where things stand and I do not see any signs of active infection at this time which is also good news. Since she is measuring better the patient has wanting to somewhat hold off on proceeding with the total contact cast which I think is reasonable at this point. 02/28/2020 on evaluation today patient appears to be doing well in general in regard to her wound although she has a lot of callus buildup as compared to last time I saw her. This is can require sharp debridement today. I do believe she really needs the total contact cast as well which we have discussed previous. 7/23; patient comes in for a total contact cast change 03/06/2020 on evaluation today patient appears to be doing quite well with regard to her wounds. Fortunately the wound bed is measuring smaller and looking much better there is little callus noted although there is some debridement necessary today. 03/13/2020 on evaluation today patient's wound actually appears to be doing excellent which is great news. With that being said unfortunately she is having some issues currently with her left leg where she does have cellulitis it appears. This may have come from an area that rubbed underneath the cast from last week that we noted we padded that area and it looks to be doing excellent at this point but nonetheless the leg was somewhat painful, swollen, and somewhat erythematous. She also  had an elevated white blood cell count of 11.5 based on what I saw on looking at her records from the med center in [facility] from where she was seen yesterday. Unfortunately with [place] having a provider on vacation there was no one here in the clinic in the afternoon when she called therefore she went to the ER as advised. Subsequently they did not cut off the cast as they did not have anyone from orthopedics there to do so and subsequently also did not have the ability to do the Doppler for evaluation of DVT They recommended therefore given her dose of Eliquis as well as . Augmentin and sent her home to come see [place] today to have the cast taken off and then she is supposed to go back to have the study for DVT performed they are following. 03/20/2020 upon evaluation today patient appears to be doing well with regard to her foot all things considered we have not been able to use the total contact cast due to the infection that she had last week. She has been on the doxycycline and she had a 10-day supply of that I do believe that is helping and her leg appears to be doing better. With that being said there is fortunately no signs of active infection systemically at this time which  is good news. No fevers, chills, nausea, vomiting, or diarrhea. 03/27/2020 upon evaluation today patient appears to be doing well with regard to her foot ulcer. There does not appear to be signs of active infection which is great news. Overall I am very pleased with where things stand at this point. 04/03/2020 upon evaluation today patient appears to be doing pretty well in regard to the overall appearance of her wound. Fortunately there is no signs of active infection at this time which is great news. No fevers, chills, nausea, vomiting, or diarrhea. With that being said she does have some blue-green drainage that actually is a little bit concerning to me for the possibility of Pseudomonas. I discussed that with the patient  today. With that being said I do believe that we may be able to manage this however with the topical antibiotic cream as opposed to having to do anything oral especially since she seems to be doing so well with overall appearance of the wound. 04/10/2020 on evaluation today patient appears to be doing about the same roughly in regard to the overall size of her wound. With that being said she fortunately has not shown any signs of worsening overall which is good news. I do believe that she is doing a great job trying to offload but again she may still do better with the cast. I do not see in the blue-green drainage that we noticed previously I do believe the gentamicin help in this regard. 04/17/2020 on evaluation today patient's wound appears to be doing about the same at this point. There is no significant improvement at this point. No fever chills noted. She is up for put the cast back on the day. That she states in a couple weeks she will need to have this off to go to a workshop. 04/24/2020 on evaluation today patient appears to be doing significantly better in regard to her wound. Fortunately there is no signs of active infection and overall feel like she is making great progress the cast seems to have done excellent for her. 05/01/2020 upon evaluation today patient presents for reevaluation she really does not appear to be doing too badly in regard to the actual wound on the left foot we have been managing. Unfortunately she has bilateral lower extremity edema with blisters between the webspace of her first and second toe on both feet. She has a tremendous amount of edema in the legs which I think is where this is coming from it does not appear to be infected but nonetheless I do believe this is can be something that needs to be addressed today. Obviously this means we probably will not be putting the cast on at this point. She attributes this to the fact that she was sitting with her feet on the floor  much longer during a conference last week she had a great time but unfortunately had a lot of complications as a result. 05/08/2020 upon evaluation today patient appears to be doing somewhat better in regard to her wounds at this time. Fortunately there is no signs of active infection which is great news. With that being said I do believe that the blisters have ruptured and unfortunately did not just reattach I will remove some of the blistered tissue today. With that being said I do think the wound itself on the plantar aspect of left foot does need to have sharp debridement. 05/15/2020 upon evaluation today patient appears to be doing about the same in regard to her  foot ulcer. Unfortunately in the past week her husband had a fall where he sustained a mild traumatic brain bleed. Fortunately he is doing better but being that he was in the hospital she had a walk on this a lot more. The wound does not appear to be any better is also not really appearing to be significantly worse which is good news. There is no signs of active infection at this time. 10/14; patient with a small diabetic wound on the medial part of her left foot. We have been using silver collagen a total contact cast making good progress. I think the patient had a series of blisters on her dorsal foot probably secondary to having her legs recumbent for 3 days while in a conference in [place]. We wrapped her leg last week these are all healed. We did not previously have her in compression on the right leg. 05/29/2020 upon evaluation today patient appears to be doing well with regard to the wound on the plantar aspect of her foot medially. This is measuring smaller and looking much better than last time I saw her. Again when I did see her last was 2 weeks back and the wound was significantly larger. I do believe the cast is helping and I believe the collagen is a good option for her. 06/05/2020 on evaluation today patient appears to be  doing well with regard to her foot ulcer this is actually measuring significantly better and overall I feel like she is doing excellent. There is no signs of active infection at this time. 06/12/2020 upon evaluation today patient actually continues to show signs of good improvement which is excellent news. I am extremely pleased with how she seems to be progressing at this point in regard to her wound. There is still some depth to the wound but I do believe the collagen is helping her quite a bit. 06/19/2020 upon evaluation today patient appears to be doing well with regard to her plantar foot ulcer. She is actually making excellent progress and in fact this appears to be almost completely healed. With that being said I do believe that the patient is going to actually require 1 more week in the cast although after that I am hopeful she will be ready for discharge. 06/26/2020 on evaluation today patient appears to be doing well in regard to her wound currently. Fortunately there is no signs of active infection in general I feel like she is doing excellent. This appears to be completely healed I think she is ready to come out of the cast. 11/29; patient comes back in the clinic today with a very quick reopening in the exact same area on the medial plantar foot. She had been healed out last time. She went back into some new balance shoes that she got at [employer] with a custom insert. As it turns out this wound also happened when wearing these shoes although there was some modification made I think with the wound initially happened they added some foam around the wound area. This obviously is not going to be sufficient. 07/17/2020 on evaluation today patient appears to be doing well with regard to her wound. Fortunately she seems to be making good progress. Unfortunately she was in the hospital due to a issue with colitis and had just been discharged today in fact. She tells me that her biggest concern here  is that a lot of her numbers especially her creatinine were somewhat elevated and problematic. She is can be following up  with her provider in order to have a further work-up at this time. With that being said she did need to come back in for a cast change. She did not allow them to remove the cast due to the fact that she did not feel like that was the issue whatsoever and indeed it does not appear that was the case her wound appears to be doing excellent today. 07/24/2020 upon evaluation today patient appears to be doing well in regard to her foot ulcer. She still has a small opening but this is showing signs of excellent improvement overall but they were very close to complete resolution. No fevers, chills, nausea, vomiting, or diarrhea. 07/31/2020 upon evaluation today patient actually appears to be doing excellent she is actually completely healed this is great news. Fortunately there is no signs of active infection at this time. No fevers, chills, nausea, vomiting, or diarrhea. The patient tells me that she did see Dr. Paulla Dolly in order to get to [place] so that she can have a custom shoe made. She saw him earlier last week. She does have an appointment with Liliane Channel on the sixth I believe on January she tells me Readmission: 08/28/2020 on evaluation today patient appears to be doing poorly in regard to her wound currently. She tells me this has reopened. The good news that she did get measured for and actually her shoes are on the way in from [facility].. With that being said she has been trying to stay off of this is much as possible using her wheelchair around home. Nonetheless has been somewhat difficult. The good news is her shoes should be here next week 09/04/2020 upon evaluation today patient appears to be doing well with regard to her wound. There is a little bit of callus buildup but nothing too significant she does tell me she was very active this week. Fortunately there is no signs of  systemic infection at this point. The dorsal foot wound actually appears to be doing better. I think this is healed. 09/11/2020 upon evaluation today patient appears to actually be doing quite well in my opinion based on what I am seeing today. Fortunately there is no signs of infection in fact her mother is certain that this is even open any longer based on what I see. Fortunately I think the patient has been doing everything she can to try to keep this under control. 09/18/2020 upon evaluation today patient appears to be doing about the same in regard to her foot ulcer. She has been tolerating the dressing changes without complication. Fortunately there is no sign of active infection at this time. No fevers, chills, nausea, vomiting, or diarrhea. She is going require some sharp debridement today. 09/25/2020 upon evaluation today patient appears to be doing well with regard to his wounds she has been tolerating the dressing changes without complication. Her wound appears to be completely healed based on what I am seeing at this point. There does not appear to be any signs of active infection at this time which is great news. No fevers, chills, nausea, vomiting, or diarrhea. She did get the cushion for her foot as well that she ordered to try to help keep pressure off of this area. That looks like it may be very beneficial for her to be honest. 10/02/2020 upon evaluation today patient appears to be doing more poorly in regard to her foot ulcer. Unfortunately this has reopened since we saw her last week. It apparently did not take  too long at all for this to happen. She is obviously somewhat disappointed as she was hopeful that that would be time this thing would stay closed. Fortunately there is no evidence of active infection at this point. No fevers, chills, nausea, vomiting, or diarrhea. With that being said I am good have to perform a little bit of sharp debridement clear away some of the debris  currently. Including a minimal amount of callus at this time. 10/16/2020 upon evaluation today patient appears to be doing about the same in regard to her foot ulcer unfortunately. There does not appear to be any signs of active infection which is great news. No fever chills noted she has been tolerating the dressing changes without complication which is great news. Unfortunately she tells me that she has been very depressed about the situation is getting very frustrating to her that she continues to have issues despite everything that she is trying to do to stay off of her foot. She is extremely discouraged and I do hate to hear this. Obviously we have been hopeful that if she got her shoes that would make a difference she is actually can be getting those tomorrow but I am not certain that that alone is good to be enough to get this to heal. We may end up having to consider going back into a total contact cast to get this to heal and then subsequently once we get her healed get her into her new diabetic shoes which will and my hope anyway keep this from reopening again. 10/23/2020 upon evaluation today patient appears to be doing a little worse both in regard to the size of her wound as well as in regard to the fact that she has erythema surrounding the wound and wrap around the lateral part of her foot where she tells me has been somewhat sore. Fortunately there does not appear to be any evidence of infection systemically but locally definitely there is erythema and warmth consistent with local cellulitis. I am glad to remove some of the callus as well. 10/30/2020 upon evaluation today patient appears to be doing well with regard to her wound on the plantar aspect of her foot. This is significantly improved compared to last week. 10/30/2020 upon evaluation today patient actually is making excellent progress her wound appears to show signs of great improvement which is wonderful and that she is extremely  pleased to hear this. She actually leaves Sunday to go on her trip with her half sister and friends. Objective Constitutional Well-nourished and well-hydrated in no acute distress. Vitals Time Taken: 2:56 PM, Height: 66 in, Weight: 245 lbs, BMI: 39.5, Temperature: 98.1 F, Pulse: 96 bpm, Respiratory Rate: 18 breaths/min, Blood Pressure: 120/76 mmHg. Respiratory normal breathing without difficulty. Psychiatric this patient is able to make decisions and demonstrates good insight into disease process. Alert and Oriented x 3. pleasant and cooperative. General Notes: Upon inspection patient's wound bed actually showed signs of good granulation epithelization at this point. There does not appear to be any sign of infection which is great news and overall very pleased with where things stand today. I did perform some sharp debridement to clear away some callus although this really did not worsen or change or increase the size of the wound overall I feel like it is doing quite well. She is still taking the antibiotics. Integumentary (Hair, Skin) Wound #1RRR status is Open. Original cause of wound was Gradually Appeared. The date acquired was: 05/11/2019. The wound has been  in treatment 41 weeks. The wound is located on the [place]. The wound measures 0.2cm length x 0.3cm width x 0.2cm depth; 0.047cm^2 area and 0.009cm^3 volume. There is Fat Layer (Subcutaneous Tissue) exposed. There is no tunneling or undermining noted. There is a medium amount of serosanguineous drainage noted. The wound margin is thickened. There is large (67-100%) pink, pale granulation within the wound bed. There is no necrotic tissue within the wound bed. Assessment Active Problems ICD-10 Type 2 diabetes mellitus with foot ulcer Non-pressure chronic ulcer of other part of left foot with fat layer exposed Charcot's joint, left ankle and foot Type 2 diabetes mellitus with diabetic neuropathy,  unspecified Procedures Wound #1RRR Pre-procedure diagnosis of Wound #1RRR is a Diabetic Wound/Ulcer of the Lower Extremity located on the Left,Medial,Plantar Foot .Severity of Tissue Pre Debridement is: Fat layer exposed. There was a Excisional Skin/Subcutaneous Tissue Debridement with a total area of 4 sq cm performed by Worthy Keeler, PA. With the following instrument(s): Curette to remove Viable and Non-Viable tissue/material. Material removed includes Callus, Subcutaneous Tissue, Slough, and Skin: Epidermis after achieving pain control using Lidocaine 4% Topical Solution. No specimens were taken. A time out was conducted at 15:28, prior to the start of the procedure. A Minimum amount of bleeding was controlled with Pressure. The procedure was tolerated well with a pain level of 0 throughout and a pain level of 0 following the procedure. Post Debridement Measurements: 0.3cm length x 0.3cm width x 0.1cm depth; 0.007cm^3 volume. Character of Wound/Ulcer Post Debridement is improved. Severity of Tissue Post Debridement is: Fat layer exposed. Post procedure Diagnosis Wound #1RRR: Same as Pre-Procedure Plan Follow-up Appointments: Return Appointment in 2 weeks. Bathing/ Shower/ Hygiene: May shower and wash wound with soap and water. - with dressing changes Off-Loading: Open toe surgical shoe to: - left foot Other: - minimal weight bearing left foot Additional Orders / Instructions: Follow Nutritious Diet WOUND #1RRR: - Foot Wound Laterality: Plantar, Left, Medial Cleanser: Wound Cleanser (Generic) Every Other Day/30 Days Discharge Instructions: Cleanse the wound with wound cleanser prior to applying a clean dressing using gauze sponges, not tissue or cotton balls. Prim Dressing: KerraCel Ag Gelling Fiber Dressing, 2x2 in (silver alginate) (Generic) Every Other Day/30 Days ary Discharge Instructions: Apply silver alginate to wound bed as instructed Secondary Dressing: Woven Gauze Sponges  2x2 in Every Other Day/30 Days Discharge Instructions: Apply over primary dressing as directed. Secondary Dressing: Optifoam Non-Adhesive Dressing, 4x4 in (Generic) Every Other Day/30 Days Discharge Instructions: Apply over primary dressing cut to make foam donut Secured With: Kerlix Roll Sterile, 4.5x3.1 (in/yd) (Generic) Every Other Day/30 Days Discharge Instructions: Secure with Kerlix as directed. Secured With: 48M Medipore H Soft Cloth Surgical T 4 x 2 (in/yd) (Generic) Every Other Day/30 Days ape Discharge Instructions: Secure dressing with tape as directed. 1. Would recommend that we going continue with the wound care measures as before and the patient is in agreement with the plan that includes the use of the silver alginate dressing which I think is doing a good job. 2. I am also can recommend that we have the patient continue to use the foam for offloading as far as her foot is concerned. 3. I am also going to recommend that she continue to limit walking I think that is the best thing she can do to help this she does a very good job at doing so. She also tells me that she is good to have everybody helping her out on this trip  so that she will not have to be getting up and doing a whole lot which is great news. We will see patient back for reevaluation in 2 weeks here in the clinic. If anything worsens or changes patient will contact our office for additional recommendations. Electronic Signature(s) Signed: 10/30/2020 5:22:41 PM By: Worthy Keeler PA-C Entered By: Worthy Keeler on 10/30/2020 17:22:41 -------------------------------------------------------------------------------- SuperBill Details Patient Name: Date of Service: [name] [name], [name] RA E. 10/30/2020 Medical Record Number: [id number] Patient Account Number: [id number] Date of Birth/Sex: Treating RN: 21-Nov-1945 (75 y.o. [name] Other Clinician: Referring Provider: Treating  Provider/Extender: Adele Schilder in Treatment: 29 Diagnosis Coding ICD-10 Codes Code Description E11.621 Type 2 diabetes mellitus with foot ulcer L97.522 Non-pressure chronic ulcer of other part of left foot with fat layer exposed M14.672 Charcot's joint, left ankle and foot E11.40 Type 2 diabetes mellitus with diabetic neuropathy, unspecified Facility Procedures CPT4 Code: 57505183 Description: 35825 - DEB SUBQ TISSUE 20 SQ CM/< ICD-10 Diagnosis Description L97.522 Non-pressure chronic ulcer of other part of left foot with fat layer exposed Modifier: Quantity: 1 Physician Procedures : CPT4 Code Description Modifier [id number] 03128 - WC PHYS SUBQ TISS 20 SQ CM ICD-10 Diagnosis Description L97.522 Non-pressure chronic ulcer of other part of left foot with fat layer exposed Quantity: 1 Electronic Signature(s) Signed: 10/30/2020 5:22:51 PM By: Worthy Keeler PA-C Entered By: Worthy Keeler on 10/30/2020 17:22:50

## 2020-10-31 NOTE — Progress Notes (Signed)
[name] [name], [name] [name] ([id number]) Visit Report for 10/30/2020 Arrival Information Details Patient Name: Date of Service: [name] [name], [name] [name] 10/30/2020 2:45 PM Medical Record Number: [id number] Patient Account Number: [id number] Date of Birth/Sex: Treating RN: 04/23/1946 (75 y.o. [name] [name] Primary Care Aubrie Lucien: [name] Other Clinician: Referring Kazzandra Desaulniers: Treating Mykelle Cockerell/Extender: Adele Schilder in Treatment: 54 Visit Information History Since Last Visit Added or deleted any medications: No Patient Arrived: Ambulatory Any new allergies or adverse reactions: No Arrival Time: 14:55 Had a fall or experienced change in No Accompanied By: alone activities of daily living that may affect Transfer Assistance: None risk of falls: Patient Identification Verified: Yes Signs or symptoms of abuse/neglect since No Secondary Verification Process Completed: Yes last visito Patient Requires Transmission-Based Precautions: No Hospitalized since last visit: No Patient Has Alerts: No Implantable device outside of the clinic No excluding cellular tissue based products placed in the center since last visit: Has Dressing in Place as Prescribed: Yes Has Footwear/Offloading in Place as Yes Prescribed: Left: Surgical Shoe with Pressure Relief Insole Pain Present Now: No Electronic Signature(s) Signed: 10/31/2020 5:04:27 PM By: Levan Hurst RN, BSN Entered By: Levan Hurst on 10/30/2020 14:56:17 -------------------------------------------------------------------------------- Lower Extremity Assessment Details Patient Name: Date of Service: [name] [name], [name] RA E. 10/30/2020 2:45 PM Medical Record Number: [id number] Patient Account Number: [id number] Date of Birth/Sex: Treating RN: [birth year] (75 y.o. [name] [name] Primary Care Kaleyah Labreck: [name] Other Clinician: Referring Cristine Daw: Treating Aki Burdin/Extender: Landis Martins Weeks in Treatment:  41 Edema Assessment Assessed: [Left: No] [Right: No] Edema: [Left: Ye] [Right: s] Calf Left: Right: Point of Measurement: 29 cm From Medial Instep 33 cm Ankle Left: Right: Point of Measurement: 12 cm From Medial Instep 23.2 cm Vascular Assessment Pulses: Dorsalis Pedis Palpable: [Left:Yes] Electronic Signature(s) Signed: 10/31/2020 5:04:27 PM By: Levan Hurst RN, BSN Entered By: Levan Hurst on 10/30/2020 14:57:26 -------------------------------------------------------------------------------- Multi-Disciplinary Care Plan Details Patient Name: Date of Service: [name] Amato RA E. 10/30/2020 2:45 PM Medical Record Number: [id number] Patient Account Number: [id number] Date of Birth/Sex: Treating RN: November 03, 1945 (75 y.o. [name] [name] Primary Care Chivas Notz: [name] Other Clinician: Referring Makinzie Considine: Treating Britiney Blahnik/Extender: Adele Schilder in Treatment: [place] reviewed with physician Active Inactive Nutrition Nursing Diagnoses: Impaired glucose control: actual or potential Potential for alteratiion in Nutrition/Potential for imbalanced nutrition Goals: Patient/caregiver verbalizes understanding of need to maintain therapeutic glucose control per primary care physician Date Initiated: 01/17/2020 Date Inactivated: 02/14/2020 Target Resolution Date: 02/14/2020 Goal Status: Met Patient/caregiver will maintain therapeutic glucose control Date Initiated: 01/17/2020 Target Resolution Date: 11/20/2020 Goal Status: Active Interventions: Assess HgA1c results as ordered upon admission and as needed Assess patient nutrition upon admission and as needed per policy Provide education on elevated blood sugars and impact on wound healing Treatment Activities: Patient referred to Primary Care Physician for further nutritional evaluation : 01/17/2020 Notes: Wound/Skin Impairment Nursing Diagnoses: Impaired tissue integrity Knowledge  deficit related to ulceration/compromised skin integrity Goals: Patient/caregiver will verbalize understanding of skin care regimen Date Initiated: 01/17/2020 Target Resolution Date: 11/20/2020 Goal Status: Active Ulcer/skin breakdown will have a volume reduction of 30% by week 4 Date Initiated: 01/17/2020 Date Inactivated: 02/14/2020 Target Resolution Date: 02/14/2020 Goal Status: Met Ulcer/skin breakdown will have a volume reduction of 50% by week 8 Date Initiated: 02/14/2020 Date Inactivated: 03/13/2020 Target Resolution Date: 03/13/2020 Goal Status: Met Ulcer/skin breakdown will have a volume reduction of 80% by week 12 Date Initiated: 03/13/2020 Date  Inactivated: 04/10/2020 Target Resolution Date: 04/10/2020 Goal Status: Unmet Unmet Reason: infection Interventions: Assess patient/caregiver ability to obtain necessary supplies Assess patient/caregiver ability to perform ulcer/skin care regimen upon admission and as needed Assess ulceration(s) every visit Provide education on ulcer and skin care Treatment Activities: Skin care regimen initiated : 01/17/2020 Topical wound management initiated : 01/17/2020 Notes: Electronic Signature(s) Signed: 10/30/2020 5:47:10 PM By: [name] RN, BSN Entered By: [name] on 10/30/2020 15:28:48 -------------------------------------------------------------------------------- Pain Assessment Details Patient Name: Date of Service: [name] Amato RA E. 10/30/2020 2:45 PM Medical Record Number: [id number] Patient Account Number: [id number] Date of Birth/Sex: Treating RN: 1946-02-11 (75 y.o. [name] [name] Primary Care Ane Conerly: [name] Other Clinician: Referring Shanaye Rief: Treating Samyak Sackmann/Extender: Adele Schilder in Treatment: 24 Active Problems Location of Pain Severity and Description of Pain Patient Has Paino No Site Locations Pain Management and Medication Current Pain Management: Electronic Signature(s) Signed:  10/31/2020 5:04:27 PM By: Levan Hurst RN, BSN Entered By: Levan Hurst on 10/30/2020 14:56:52 -------------------------------------------------------------------------------- Patient/Caregiver Education Details Patient Name: Date of Service: [name] Amato RA E. 3/23/2022andnbsp2:45 PM Medical Record Number: [id number] Patient Account Number: [id number] Date of Birth/Gender: Treating RN: 12/31/1945 (75 y.o. [name] [name] Primary Care Physician: [name] Other Clinician: Referring Physician: Treating Physician/Extender: Adele Schilder in Treatment: 34 Education Assessment Education Provided To: Patient Education Topics Provided Offloading: Methods: Explain/Verbal Responses: Reinforcements needed, State content correctly Wound/Skin Impairment: Methods: Explain/Verbal Responses: Reinforcements needed, State content correctly Electronic Signature(s) Signed: 10/30/2020 5:47:10 PM By: [name] RN, BSN Entered By: [name] on 10/30/2020 15:30:22 -------------------------------------------------------------------------------- Wound Assessment Details Patient Name: Date of Service: [name] Amato RA E. 10/30/2020 2:45 PM Medical Record Number: [id number] Patient Account Number: [id number] Date of Birth/Sex: Treating RN: 06/25/1946 (75 y.o. [name] [name] Primary Care Onaje Warne: [name] Other Clinician: Referring Tamla Winkels: Treating Kanton Kamel/Extender: Landis Martins Weeks in Treatment: 41 Wound Status Wound Number: [id number] Primary Diabetic Wound/Ulcer of the Lower Extremity Etiology: Wound Location: Left, Medial, Plantar Foot Wound Open Wounding Event: Gradually Appeared Status: Date Acquired: 05/11/2019 Comorbid Sleep Apnea, Deep Vein Thrombosis, Hypertension, Colitis, Type Weeks Of Treatment: 41 History: II Diabetes, Gout, Osteoarthritis, Neuropathy Clustered Wound: No Photos Wound Measurements Length: (cm)  0.2 Width: (cm) 0.3 Depth: (cm) 0.2 Area: (cm) 0.047 Volume: (cm) 0.009 % Reduction in Area: 95.4% % Reduction in Volume: 97.8% Epithelialization: Large (67-100%) Tunneling: No Undermining: No Wound Description Classification: Grade 2 Wound Margin: Thickened Exudate Amount: Medium Exudate Type: Serosanguineous Exudate Color: red, brown Foul Odor After Cleansing: No Slough/Fibrino No Wound Bed Granulation Amount: Large (67-100%) Exposed Structure Granulation Quality: Pink, Pale Fascia Exposed: No Necrotic Amount: None Present (0%) Fat Layer (Subcutaneous Tissue) Exposed: Yes Tendon Exposed: No Muscle Exposed: No Joint Exposed: No Bone Exposed: No Electronic Signature(s) Signed: 10/31/2020 9:47:55 AM By: Sandre Kitty Signed: 10/31/2020 5:04:27 PM By: Levan Hurst RN, BSN Entered By: Sandre Kitty on 10/30/2020 16:31:59 -------------------------------------------------------------------------------- Vitals Details Patient Name: Date of Service: Blankenbeckler, [name] RA E. 10/30/2020 2:45 PM Medical Record Number: [id number] Patient Account Number: [id number] Date of Birth/Sex: Treating RN: 01/14/1946 (75 y.o. [name] [name] Primary Care Tyerra Loretto: [name] Other Clinician: Referring Zadrian Mccauley: Treating Jalia Zuniga/Extender: Adele Schilder in Treatment: 36 Vital Signs Time Taken: 14:56 Temperature (F): 98.1 Height (in): 66 Pulse (bpm): 96 Weight (lbs): 245 Respiratory Rate (breaths/min): 18 Body Mass Index (BMI): 39.5 Blood Pressure (mmHg): 120/76 Reference Range: 80 - 120 mg / dl  Electronic Signature(s) Signed: 10/31/2020 5:04:27 PM By: Levan Hurst RN, BSN Entered By: Levan Hurst on 10/30/2020 14:56:47

## 2020-11-05 ENCOUNTER — Telehealth: Payer: Self-pay | Admitting: Cardiovascular Disease

## 2020-11-05 NOTE — Telephone Encounter (Signed)
1) What problem are you experiencing? Patient has a problem with the settings of her CPAP Machine. [place] got messed up in transit   2) Who is your medical equipment company? [employer]    Please route to the sleep study assistant.

## 2020-11-13 ENCOUNTER — Other Ambulatory Visit: Payer: Self-pay

## 2020-11-13 ENCOUNTER — Ambulatory Visit: Payer: Medicare Other | Admitting: Internal Medicine

## 2020-11-13 ENCOUNTER — Encounter (HOSPITAL_BASED_OUTPATIENT_CLINIC_OR_DEPARTMENT_OTHER): Payer: Medicare Other | Attending: Physician Assistant | Admitting: Physician Assistant

## 2020-11-13 DIAGNOSIS — I10 Essential (primary) hypertension: Secondary | ICD-10-CM | POA: Diagnosis not present

## 2020-11-13 DIAGNOSIS — E11621 Type 2 diabetes mellitus with foot ulcer: Secondary | ICD-10-CM | POA: Diagnosis not present

## 2020-11-13 DIAGNOSIS — Z8249 Family history of ischemic heart disease and other diseases of the circulatory system: Secondary | ICD-10-CM | POA: Insufficient documentation

## 2020-11-13 DIAGNOSIS — E1140 Type 2 diabetes mellitus with diabetic neuropathy, unspecified: Secondary | ICD-10-CM | POA: Insufficient documentation

## 2020-11-13 DIAGNOSIS — L97522 Non-pressure chronic ulcer of other part of left foot with fat layer exposed: Secondary | ICD-10-CM | POA: Diagnosis not present

## 2020-11-13 DIAGNOSIS — Z86718 Personal history of other venous thrombosis and embolism: Secondary | ICD-10-CM | POA: Insufficient documentation

## 2020-11-13 NOTE — Progress Notes (Addendum)
FLORENTINE, DIEKMAN ([id number]) Visit Report for 11/13/2020 Chief Complaint Document Details Patient Name: Date of Service: ATHENIA, RYS RA E. 11/13/2020 12:30 PM Medical Record Number: [id number] Patient Account Number: [id number] Date of Birth/Sex: Treating RN: 02-26-1946 (75 y.o. [name] Other Clinician: Referring Provider: Treating Provider/Extender: Adele Schilder in Treatment: 80 Information Obtained from: Patient Chief Complaint Left foot ulcer Electronic Signature(s) Signed: 11/13/2020 12:47:00 PM By: Worthy Keeler PA-C Entered By: Worthy Keeler on 11/13/2020 12:47:00 -------------------------------------------------------------------------------- Debridement Details Patient Name: Date of Service: [name] 11/13/2020 12:30 PM Medical Record Number: [id number] Patient Account Number: [id number] Date of Birth/Sex: Treating RN: 1946-05-30 (75 y.o. [name] Other Clinician: Referring Provider: Treating Provider/Extender: Adele Schilder in Treatment: 43 Debridement Performed for Assessment: Wound #1RRR Left,Medial,Plantar Foot Performed By: Physician Worthy Keeler, PA Debridement Type: Debridement Severity of Tissue Pre Debridement: Fat layer exposed Level of Consciousness (Pre-procedure): Awake and Alert Pre-procedure Verification/Time Out Yes - 13:40 Taken: Start Time: 13:42 Pain Control: Other : benzocaine 20% spray T Area Debrided (L x W): otal 1 (cm) x 1 (cm) = 1 (cm) Tissue and other material debrided: Viable, Non-Viable, Callus, Slough, Subcutaneous, Skin: Epidermis, Slough Level: Skin/Subcutaneous Tissue Debridement Description: Excisional Instrument: Curette Bleeding: Minimum Hemostasis Achieved: Pressure End Time: 13:48 Procedural Pain: 0 Post Procedural Pain: 0 Response to Treatment: Procedure was tolerated well Level  of Consciousness (Post- Awake and Alert procedure): Post Debridement Measurements of Total Wound Length: (cm) 0.4 Width: (cm) 0.4 Depth: (cm) 0.2 Volume: (cm) 0.025 Character of Wound/Ulcer Post Debridement: Improved Severity of Tissue Post Debridement: Fat layer exposed Post Procedure Diagnosis Same as Pre-procedure Electronic Signature(s) Signed: 11/13/2020 5:46:22 PM By: Worthy Keeler PA-C Signed: 11/13/2020 5:59:57 PM By: [name] RN, BSN Entered By: [name] on 11/13/2020 13:48:19 -------------------------------------------------------------------------------- HPI Details Patient Name: Date of Service: Ergle, [name] 11/13/2020 12:30 PM Medical Record Number: [id number] Patient Account Number: [id number] Date of Birth/Sex: Treating RN: 1946/07/05 (75 y.o. [name] Other Clinician: Referring Provider: Treating Provider/Extender: Adele Schilder in Treatment: 64 History of Present Illness HPI Description: 01/17/2020 upon evaluation today patient presents for initial evaluation here in our clinic concerning issues she has been having with a left medial/plantar foot ulcer. This is actually been an issue for her since October 2020. She has been seeing Dr. Doran Durand for quite some time during that course. Fortunately there is no signs of active infection at this time. Or least no mention of this to have seen in general. With that being said unfortunately I do see some signs of erythema noted today that does have me concerned about the possibility of infection at this point in the surrounding area of the wound. There is also a warm to touch at the site which is somewhat concerning. Fortunately there is no evidence of systemic infection which is great news. The patient does have a history of diabetes mellitus type 2, Charcot foot which is what led to the wound, and hypertension. She notes that she was in a cast for  some time with Dr. Doran Durand for about 8 weeks. During that time they were utilizing according to the patient silver nitrate along with a foam doughnut and then Coban to secure in place in the cast in place. With that being said I do not have the  actual records to review we are going to try to get a hold of those unfortunately they would not flow over into care everywhere I did look today. She has been seeing Dr. Doran Durand and his physician assistant Larkin Ina up until the end of May and apparently is still seeing them on a regular basis every 2 weeks roughly. She has also tried Iodosorb without effect here. 01/24/2020 upon evaluation today patient actually appears to be doing quite well with regard to her wounds. She has been tolerating the dressing changes without complication. Fortunately there is no signs of active infection spreading which is good news. Her culture did show signs of Staph aureus I did place her on Augmentin due to the erythema surrounding the wound. With that being said the wound does appear to be doing better she has her longer walking cast/boot and I think that is actually good for her for the time being. I am considering reinitiating total contact cast when she gets back from vacation but next week she will actually be out of town at [employer] she knows not to get in the water but she still obviously is planning to enjoy herself she is going to take it easy on her foot however. 02/07/2020 upon evaluation today patient appears to be doing fairly well in regard to her ulcer on her foot. Fortunately there is no signs of severe infection at this time which is great news and overall very pleased in that regard. With that being said I do think that she could still benefit from a total contact cast. Nonetheless she is using her walking boot which at least provide some protection and that it prevents some of the friction occurring when she is ambulating. 02/14/2020 upon evaluation today patient  appears to be doing well with regard to her foot ulcer. This is actually measuring a little bit smaller yet again this week. Overall very pleased with where things stand and I do not see any signs of active infection at this time which is also good news. Since she is measuring better the patient has wanting to somewhat hold off on proceeding with the total contact cast which I think is reasonable at this point. 02/28/2020 on evaluation today patient appears to be doing well in general in regard to her wound although she has a lot of callus buildup as compared to last time I saw her. This is can require sharp debridement today. I do believe she really needs the total contact cast as well which we have discussed previous. 7/23; patient comes in for a total contact cast change 03/06/2020 on evaluation today patient appears to be doing quite well with regard to her wounds. Fortunately the wound bed is measuring smaller and looking much better there is little callus noted although there is some debridement necessary today. 03/13/2020 on evaluation today patient's wound actually appears to be doing excellent which is great news. With that being said unfortunately she is having some issues currently with her left leg where she does have cellulitis it appears. This may have come from an area that rubbed underneath the cast from last week that we noted we padded that area and it looks to be doing excellent at this point but nonetheless the leg was somewhat painful, swollen, and somewhat erythematous. She also had an elevated white blood cell count of 11.5 based on what I saw on looking at her records from the med center in [facility] from where she was seen yesterday. Unfortunately  with [place] having a provider on vacation there was no one here in the clinic in the afternoon when she called therefore she went to the ER as advised. Subsequently they did not cut off the cast as they did not have anyone from orthopedics  there to do so and subsequently also did not have the ability to do the Doppler for evaluation of DVT They recommended therefore given her dose of Eliquis as well as . Augmentin and sent her home to come see [place] today to have the cast taken off and then she is supposed to go back to have the study for DVT performed they are following. 03/20/2020 upon evaluation today patient appears to be doing well with regard to her foot all things considered we have not been able to use the total contact cast due to the infection that she had last week. She has been on the doxycycline and she had a 10-day supply of that I do believe that is helping and her leg appears to be doing better. With that being said there is fortunately no signs of active infection systemically at this time which is good news. No fevers, chills, nausea, vomiting, or diarrhea. 03/27/2020 upon evaluation today patient appears to be doing well with regard to her foot ulcer. There does not appear to be signs of active infection which is great news. Overall I am very pleased with where things stand at this point. 04/03/2020 upon evaluation today patient appears to be doing pretty well in regard to the overall appearance of her wound. Fortunately there is no signs of active infection at this time which is great news. No fevers, chills, nausea, vomiting, or diarrhea. With that being said she does have some blue-green drainage that actually is a little bit concerning to me for the possibility of Pseudomonas. I discussed that with the patient today. With that being said I do believe that we may be able to manage this however with the topical antibiotic cream as opposed to having to do anything oral especially since she seems to be doing so well with overall appearance of the wound. 04/10/2020 on evaluation today patient appears to be doing about the same roughly in regard to the overall size of her wound. With that being said she fortunately has not  shown any signs of worsening overall which is good news. I do believe that she is doing a great job trying to offload but again she may still do better with the cast. I do not see in the blue-green drainage that we noticed previously I do believe the gentamicin help in this regard. 04/17/2020 on evaluation today patient's wound appears to be doing about the same at this point. There is no significant improvement at this point. No fever chills noted. She is up for put the cast back on the day. That she states in a couple weeks she will need to have this off to go to a workshop. 04/24/2020 on evaluation today patient appears to be doing significantly better in regard to her wound. Fortunately there is no signs of active infection and overall feel like she is making great progress the cast seems to have done excellent for her. 05/01/2020 upon evaluation today patient presents for reevaluation she really does not appear to be doing too badly in regard to the actual wound on the left foot we have been managing. Unfortunately she has bilateral lower extremity edema with blisters between the webspace of her first and second toe on  both feet. She has a tremendous amount of edema in the legs which I think is where this is coming from it does not appear to be infected but nonetheless I do believe this is can be something that needs to be addressed today. Obviously this means we probably will not be putting the cast on at this point. She attributes this to the fact that she was sitting with her feet on the floor much longer during a conference last week she had a great time but unfortunately had a lot of complications as a result. 05/08/2020 upon evaluation today patient appears to be doing somewhat better in regard to her wounds at this time. Fortunately there is no signs of active infection which is great news. With that being said I do believe that the blisters have ruptured and unfortunately did not just reattach I  will remove some of the blistered tissue today. With that being said I do think the wound itself on the plantar aspect of left foot does need to have sharp debridement. 05/15/2020 upon evaluation today patient appears to be doing about the same in regard to her foot ulcer. Unfortunately in the past week her husband had a fall where he sustained a mild traumatic brain bleed. Fortunately he is doing better but being that he was in the hospital she had a walk on this a lot more. The wound does not appear to be any better is also not really appearing to be significantly worse which is good news. There is no signs of active infection at this time. 10/14; patient with a small diabetic wound on the medial part of her left foot. We have been using silver collagen a total contact cast making good progress. I think the patient had a series of blisters on her dorsal foot probably secondary to having her legs recumbent for 3 days while in a conference in [name]. We wrapped her leg last week these are all healed. We did not previously have her in compression on the right leg. 05/29/2020 upon evaluation today patient appears to be doing well with regard to the wound on the plantar aspect of her foot medially. This is measuring smaller and looking much better than last time I saw her. Again when I did see her last was 2 weeks back and the wound was significantly larger. I do believe the cast is helping and I believe the collagen is a good option for her. 06/05/2020 on evaluation today patient appears to be doing well with regard to her foot ulcer this is actually measuring significantly better and overall I feel like she is doing excellent. There is no signs of active infection at this time. 06/12/2020 upon evaluation today patient actually continues to show signs of good improvement which is excellent news. I am extremely pleased with how she seems to be progressing at this point in regard to her wound. There is  still some depth to the wound but I do believe the collagen is helping her quite a bit. 06/19/2020 upon evaluation today patient appears to be doing well with regard to her plantar foot ulcer. She is actually making excellent progress and in fact this appears to be almost completely healed. With that being said I do believe that the patient is going to actually require 1 more week in the cast although after that I am hopeful she will be ready for discharge. 06/26/2020 on evaluation today patient appears to be doing well in regard to her wound currently.  Fortunately there is no signs of active infection in general I feel like she is doing excellent. This appears to be completely healed I think she is ready to come out of the cast. 11/29; patient comes back in the clinic today with a very quick reopening in the exact same area on the medial plantar foot. She had been healed out last time. She went back into some new balance shoes that she got at [employer] with a custom insert. As it turns out this wound also happened when wearing these shoes although there was some modification made I think with the wound initially happened they added some foam around the wound area. This obviously is not going to be sufficient. 07/17/2020 on evaluation today patient appears to be doing well with regard to her wound. Fortunately she seems to be making good progress. Unfortunately she was in the hospital due to a issue with colitis and had just been discharged today in fact. She tells me that her biggest concern here is that a lot of her numbers especially her creatinine were somewhat elevated and problematic. She is can be following up with her provider in order to have a further work-up at this time. With that being said she did need to come back in for a cast change. She did not allow them to remove the cast due to the fact that she did not feel like that was the issue whatsoever and indeed it does not appear that was the  case her wound appears to be doing excellent today. 07/24/2020 upon evaluation today patient appears to be doing well in regard to her foot ulcer. She still has a small opening but this is showing signs of excellent improvement overall but they were very close to complete resolution. No fevers, chills, nausea, vomiting, or diarrhea. 07/31/2020 upon evaluation today patient actually appears to be doing excellent she is actually completely healed this is great news. Fortunately there is no signs of active infection at this time. No fevers, chills, nausea, vomiting, or diarrhea. The patient tells me that she did see Dr. Paulla Dolly in order to get to [employer] so that she can have a custom shoe made. She saw him earlier last week. She does have an appointment with Liliane Channel on the sixth I believe on January she tells me Readmission: 08/28/2020 on evaluation today patient appears to be doing poorly in regard to her wound currently. She tells me this has reopened. The good news that she did get measured for and actually her shoes are on the way in from [facility].. With that being said she has been trying to stay off of this is much as possible using her wheelchair around home. Nonetheless has been somewhat difficult. The good news is her shoes should be here next week 09/04/2020 upon evaluation today patient appears to be doing well with regard to her wound. There is a little bit of callus buildup but nothing too significant she does tell me she was very active this week. Fortunately there is no signs of systemic infection at this point. The dorsal foot wound actually appears to be doing better. I think this is healed. 09/11/2020 upon evaluation today patient appears to actually be doing quite well in my opinion based on what I am seeing today. Fortunately there is no signs of infection in fact her mother is certain that this is even open any longer based on what I see. Fortunately I think the patient has been doing  everything  she can to try to keep this under control. 09/18/2020 upon evaluation today patient appears to be doing about the same in regard to her foot ulcer. She has been tolerating the dressing changes without complication. Fortunately there is no sign of active infection at this time. No fevers, chills, nausea, vomiting, or diarrhea. She is going require some sharp debridement today. 09/25/2020 upon evaluation today patient appears to be doing well with regard to his wounds she has been tolerating the dressing changes without complication. Her wound appears to be completely healed based on what I am seeing at this point. There does not appear to be any signs of active infection at this time which is great news. No fevers, chills, nausea, vomiting, or diarrhea. She did get the cushion for her foot as well that she ordered to try to help keep pressure off of this area. That looks like it may be very beneficial for her to be honest. 10/02/2020 upon evaluation today patient appears to be doing more poorly in regard to her foot ulcer. Unfortunately this has reopened since we saw her last week. It apparently did not take too long at all for this to happen. She is obviously somewhat disappointed as she was hopeful that that would be time this thing would stay closed. Fortunately there is no evidence of active infection at this point. No fevers, chills, nausea, vomiting, or diarrhea. With that being said I am good have to perform a little bit of sharp debridement clear away some of the debris currently. Including a minimal amount of callus at this time. 10/16/2020 upon evaluation today patient appears to be doing about the same in regard to her foot ulcer unfortunately. There does not appear to be any signs of active infection which is great news. No fever chills noted she has been tolerating the dressing changes without complication which is great news. Unfortunately she tells me that she has been very  depressed about the situation is getting very frustrating to her that she continues to have issues despite everything that she is trying to do to stay off of her foot. She is extremely discouraged and I do hate to hear this. Obviously we have been hopeful that if she got her shoes that would make a difference she is actually can be getting those tomorrow but I am not certain that that alone is good to be enough to get this to heal. We may end up having to consider going back into a total contact cast to get this to heal and then subsequently once we get her healed get her into her new diabetic shoes which will and my hope anyway keep this from reopening again. 10/23/2020 upon evaluation today patient appears to be doing a little worse both in regard to the size of her wound as well as in regard to the fact that she has erythema surrounding the wound and wrap around the lateral part of her foot where she tells me has been somewhat sore. Fortunately there does not appear to be any evidence of infection systemically but locally definitely there is erythema and warmth consistent with local cellulitis. I am glad to remove some of the callus as well. 10/30/2020 upon evaluation today patient appears to be doing well with regard to her wound on the plantar aspect of her foot. This is significantly improved compared to last week. 10/30/2020 upon evaluation today patient actually is making excellent progress her wound appears to show signs of great improvement which is  wonderful and that she is extremely pleased to hear this. She actually leaves Sunday to go on her trip with her half sister and friends. 11/13/2020 on evaluation today patient's wound actually appears to be doing quite well. There does not appear to be any signs of infection it has been 2 weeks since have seen her she does have a little bit of callus buildup here today but at the same time I do not believe the vacation time set her back any just  has not really made a lot of significant improvement. She is done with her antibiotics at this point. Electronic Signature(s) Signed: 11/13/2020 1:52:30 PM By: Worthy Keeler PA-C Entered By: Worthy Keeler on 11/13/2020 13:52:30 -------------------------------------------------------------------------------- Physical Exam Details Patient Name: Date of Service: XAVIA, KNISKERN RA E. 11/13/2020 12:30 PM Medical Record Number: [id number] Patient Account Number: [id number] Date of Birth/Sex: Treating RN: 11-23-1945 (75 y.o. [name] Other Clinician: Referring Provider: Treating Provider/Extender: Adele Schilder in Treatment: 49 Constitutional Well-nourished and well-hydrated in no acute distress. Respiratory normal breathing without difficulty. Psychiatric this patient is able to make decisions and demonstrates good insight into disease process. Alert and Oriented x 3. pleasant and cooperative. Notes Patient's wound bed did require sharp debridement to remove some of the callus around the edges of the wound as well as a minimal amount of slough of the surface of the wound down to good subcutaneous tissue. She tolerated that today without complication post debridement wound bed appears to be doing better. Electronic Signature(s) Signed: 11/13/2020 1:52:45 PM By: Worthy Keeler PA-C Entered By: Worthy Keeler on 11/13/2020 13:52:45 -------------------------------------------------------------------------------- Physician Orders Details Patient Name: Date of Service: [place], [name] 11/13/2020 12:30 PM Medical Record Number: [id number] Patient Account Number: [id number] Date of Birth/Sex: Treating RN: 03-03-1946 (75 y.o. [name] Other Clinician: Referring Provider: Treating Provider/Extender: Adele Schilder in Treatment: 81 Verbal / Phone Orders:  No Diagnosis Coding ICD-10 Coding Code Description E11.621 Type 2 diabetes mellitus with foot ulcer L97.522 Non-pressure chronic ulcer of other part of left foot with fat layer exposed M14.672 Charcot's joint, left ankle and foot E11.40 Type 2 diabetes mellitus with diabetic neuropathy, unspecified Follow-up Appointments Return Appointment in 1 week. Bathing/ Shower/ Hygiene May shower and wash wound with soap and water. - with dressing changes Off-Loading Open toe surgical shoe to: - left foot Additional Orders / Instructions Follow Nutritious Diet Wound Treatment Wound #1RRR - Foot Wound Laterality: Plantar, Left, Medial Cleanser: Wound Cleanser (Generic) Every Other Day/30 Days Discharge Instructions: Cleanse the wound with wound cleanser prior to applying a clean dressing using gauze sponges, not tissue or cotton balls. Prim Dressing: KerraCel Ag Gelling Fiber Dressing, 2x2 in (silver alginate) (Generic) Every Other Day/30 Days ary Discharge Instructions: Apply silver alginate to wound bed as instructed Secondary Dressing: Woven Gauze Sponges 2x2 in Every Other Day/30 Days Discharge Instructions: Apply over primary dressing as directed. Secondary Dressing: Optifoam Non-Adhesive Dressing, 4x4 in (Generic) Every Other Day/30 Days Discharge Instructions: Apply over primary dressing cut to make foam donut Secured With: Kerlix Roll Sterile, 4.5x3.1 (in/yd) (Generic) Every Other Day/30 Days Discharge Instructions: Secure with Kerlix as directed. Secured With: 74M Medipore H Soft Cloth Surgical T 4 x 2 (in/yd) (Generic) Every Other Day/30 Days ape Discharge Instructions: Secure dressing with tape as directed. Patient Medications llergies: bee venom protein (honey bee), Sulfa (Sulfonamide Antibiotics),  Farxiga A Notifications Medication Indication Start End 11/13/2020 triamcinolone acetonide DOSE topical 0.1 % ointment - ointment topical apply in a thin film to the left thigh 2 times  per day until healed Electronic Signature(s) Signed: 11/13/2020 2:04:31 PM By: Worthy Keeler PA-C Entered By: Worthy Keeler on 11/13/2020 14:04:30 -------------------------------------------------------------------------------- Problem List Details Patient Name: Date of Service: [name] 11/13/2020 12:30 PM Medical Record Number: [id number] Patient Account Number: [id number] Date of Birth/Sex: Treating RN: Jul 29, 1946 (75 y.o. [name] Other Clinician: Referring Provider: Treating Provider/Extender: Adele Schilder in Treatment: 29 Active Problems ICD-10 Encounter Code Description Active Date MDM Diagnosis E11.621 Type 2 diabetes mellitus with foot ulcer 01/17/2020 No Yes L97.522 Non-pressure chronic ulcer of other part of left foot with fat layer exposed 01/17/2020 No Yes M14.672 Charcot's joint, left ankle and foot 01/17/2020 No Yes E11.40 Type 2 diabetes mellitus with diabetic neuropathy, unspecified 01/17/2020 No Yes Inactive Problems ICD-10 Code Description Active Date Inactive Date I10 Essential (primary) hypertension 01/17/2020 01/17/2020 Resolved Problems Electronic Signature(s) Signed: 11/13/2020 12:46:54 PM By: Worthy Keeler PA-C Entered By: Worthy Keeler on 11/13/2020 12:46:54 -------------------------------------------------------------------------------- Progress Note Details Patient Name: Date of Service: Mancusi, [name] 11/13/2020 12:30 PM Medical Record Number: [id number] Patient Account Number: [id number] Date of Birth/Sex: Treating RN: 1945-10-16 (75 y.o. [name] Other Clinician: Referring Provider: Treating Provider/Extender: Adele Schilder in Treatment: 13 Subjective Chief Complaint Information obtained from Patient Left foot ulcer History of Present Illness (HPI) 01/17/2020 upon evaluation today patient presents for  initial evaluation here in our clinic concerning issues she has been having with a left medial/plantar foot ulcer. This is actually been an issue for her since October 2020. She has been seeing Dr. Doran Durand for quite some time during that course. Fortunately there is no signs of active infection at this time. Or least no mention of this to have seen in general. With that being said unfortunately I do see some signs of erythema noted today that does have me concerned about the possibility of infection at this point in the surrounding area of the wound. There is also a warm to touch at the site which is somewhat concerning. Fortunately there is no evidence of systemic infection which is great news. The patient does have a history of diabetes mellitus type 2, Charcot foot which is what led to the wound, and hypertension. She notes that she was in a cast for some time with Dr. Doran Durand for about 8 weeks. During that time they were utilizing according to the patient silver nitrate along with a foam doughnut and then Coban to secure in place in the cast in place. With that being said I do not have the actual records to review we are going to try to get a hold of those unfortunately they would not flow over into care everywhere I did look today. She has been seeing Dr. Doran Durand and his physician assistant Larkin Ina up until the end of May and apparently is still seeing them on a regular basis every 2 weeks roughly. She has also tried Iodosorb without effect here. 01/24/2020 upon evaluation today patient actually appears to be doing quite well with regard to her wounds. She has been tolerating the dressing changes without complication. Fortunately there is no signs of active infection spreading which is good news. Her culture did show signs of Staph aureus  I did place her on Augmentin due to the erythema surrounding the wound. With that being said the wound does appear to be doing better she has her longer walking  cast/boot and I think that is actually good for her for the time being. I am considering reinitiating total contact cast when she gets back from vacation but next week she will actually be out of town at [employer] she knows not to get in the water but she still obviously is planning to enjoy herself she is going to take it easy on her foot however. 02/07/2020 upon evaluation today patient appears to be doing fairly well in regard to her ulcer on her foot. Fortunately there is no signs of severe infection at this time which is great news and overall very pleased in that regard. With that being said I do think that she could still benefit from a total contact cast. Nonetheless she is using her walking boot which at least provide some protection and that it prevents some of the friction occurring when she is ambulating. 02/14/2020 upon evaluation today patient appears to be doing well with regard to her foot ulcer. This is actually measuring a little bit smaller yet again this week. Overall very pleased with where things stand and I do not see any signs of active infection at this time which is also good news. Since she is measuring better the patient has wanting to somewhat hold off on proceeding with the total contact cast which I think is reasonable at this point. 02/28/2020 on evaluation today patient appears to be doing well in general in regard to her wound although she has a lot of callus buildup as compared to last time I saw her. This is can require sharp debridement today. I do believe she really needs the total contact cast as well which we have discussed previous. 7/23; patient comes in for a total contact cast change 03/06/2020 on evaluation today patient appears to be doing quite well with regard to her wounds. Fortunately the wound bed is measuring smaller and looking much better there is little callus noted although there is some debridement necessary today. 03/13/2020 on evaluation today  patient's wound actually appears to be doing excellent which is great news. With that being said unfortunately she is having some issues currently with her left leg where she does have cellulitis it appears. This may have come from an area that rubbed underneath the cast from last week that we noted we padded that area and it looks to be doing excellent at this point but nonetheless the leg was somewhat painful, swollen, and somewhat erythematous. She also had an elevated white blood cell count of 11.5 based on what I saw on looking at her records from the med center in [facility] from where she was seen yesterday. Unfortunately with [place] having a provider on vacation there was no one here in the clinic in the afternoon when she called therefore she went to the ER as advised. Subsequently they did not cut off the cast as they did not have anyone from orthopedics there to do so and subsequently also did not have the ability to do the Doppler for evaluation of DVT They recommended therefore given her dose of Eliquis as well as . Augmentin and sent her home to come see [place] today to have the cast taken off and then she is supposed to go back to have the study for DVT performed they are following. 03/20/2020 upon  evaluation today patient appears to be doing well with regard to her foot all things considered we have not been able to use the total contact cast due to the infection that she had last week. She has been on the doxycycline and she had a 10-day supply of that I do believe that is helping and her leg appears to be doing better. With that being said there is fortunately no signs of active infection systemically at this time which is good news. No fevers, chills, nausea, vomiting, or diarrhea. 03/27/2020 upon evaluation today patient appears to be doing well with regard to her foot ulcer. There does not appear to be signs of active infection which is great news. Overall I am very pleased with where  things stand at this point. 04/03/2020 upon evaluation today patient appears to be doing pretty well in regard to the overall appearance of her wound. Fortunately there is no signs of active infection at this time which is great news. No fevers, chills, nausea, vomiting, or diarrhea. With that being said she does have some blue-green drainage that actually is a little bit concerning to me for the possibility of Pseudomonas. I discussed that with the patient today. With that being said I do believe that we may be able to manage this however with the topical antibiotic cream as opposed to having to do anything oral especially since she seems to be doing so well with overall appearance of the wound. 04/10/2020 on evaluation today patient appears to be doing about the same roughly in regard to the overall size of her wound. With that being said she fortunately has not shown any signs of worsening overall which is good news. I do believe that she is doing a great job trying to offload but again she may still do better with the cast. I do not see in the blue-green drainage that we noticed previously I do believe the gentamicin help in this regard. 04/17/2020 on evaluation today patient's wound appears to be doing about the same at this point. There is no significant improvement at this point. No fever chills noted. She is up for put the cast back on the day. That she states in a couple weeks she will need to have this off to go to a workshop. 04/24/2020 on evaluation today patient appears to be doing significantly better in regard to her wound. Fortunately there is no signs of active infection and overall feel like she is making great progress the cast seems to have done excellent for her. 05/01/2020 upon evaluation today patient presents for reevaluation she really does not appear to be doing too badly in regard to the actual wound on the left foot we have been managing. Unfortunately she has bilateral lower  extremity edema with blisters between the webspace of her first and second toe on both feet. She has a tremendous amount of edema in the legs which I think is where this is coming from it does not appear to be infected but nonetheless I do believe this is can be something that needs to be addressed today. Obviously this means we probably will not be putting the cast on at this point. She attributes this to the fact that she was sitting with her feet on the floor much longer during a conference last week she had a great time but unfortunately had a lot of complications as a result. 05/08/2020 upon evaluation today patient appears to be doing somewhat better in regard to her wounds  at this time. Fortunately there is no signs of active infection which is great news. With that being said I do believe that the blisters have ruptured and unfortunately did not just reattach I will remove some of the blistered tissue today. With that being said I do think the wound itself on the plantar aspect of left foot does need to have sharp debridement. 05/15/2020 upon evaluation today patient appears to be doing about the same in regard to her foot ulcer. Unfortunately in the past week her husband had a fall where he sustained a mild traumatic brain bleed. Fortunately he is doing better but being that he was in the hospital she had a walk on this a lot more. The wound does not appear to be any better is also not really appearing to be significantly worse which is good news. There is no signs of active infection at this time. 10/14; patient with a small diabetic wound on the medial part of her left foot. We have been using silver collagen a total contact cast making good progress. I think the patient had a series of blisters on her dorsal foot probably secondary to having her legs recumbent for 3 days while in a conference in [place]. We wrapped her leg last week these are all healed. We did not previously have her in  compression on the right leg. 05/29/2020 upon evaluation today patient appears to be doing well with regard to the wound on the plantar aspect of her foot medially. This is measuring smaller and looking much better than last time I saw her. Again when I did see her last was 2 weeks back and the wound was significantly larger. I do believe the cast is helping and I believe the collagen is a good option for her. 06/05/2020 on evaluation today patient appears to be doing well with regard to her foot ulcer this is actually measuring significantly better and overall I feel like she is doing excellent. There is no signs of active infection at this time. 06/12/2020 upon evaluation today patient actually continues to show signs of good improvement which is excellent news. I am extremely pleased with how she seems to be progressing at this point in regard to her wound. There is still some depth to the wound but I do believe the collagen is helping her quite a bit. 06/19/2020 upon evaluation today patient appears to be doing well with regard to her plantar foot ulcer. She is actually making excellent progress and in fact this appears to be almost completely healed. With that being said I do believe that the patient is going to actually require 1 more week in the cast although after that I am hopeful she will be ready for discharge. 06/26/2020 on evaluation today patient appears to be doing well in regard to her wound currently. Fortunately there is no signs of active infection in general I feel like she is doing excellent. This appears to be completely healed I think she is ready to come out of the cast. 11/29; patient comes back in the clinic today with a very quick reopening in the exact same area on the medial plantar foot. She had been healed out last time. She went back into some new balance shoes that she got at [employer] with a custom insert. As it turns out this wound also happened when wearing these  shoes although there was some modification made I think with the wound initially happened they added some foam around the wound  area. This obviously is not going to be sufficient. 07/17/2020 on evaluation today patient appears to be doing well with regard to her wound. Fortunately she seems to be making good progress. Unfortunately she was in the hospital due to a issue with colitis and had just been discharged today in fact. She tells me that her biggest concern here is that a lot of her numbers especially her creatinine were somewhat elevated and problematic. She is can be following up with her provider in order to have a further work-up at this time. With that being said she did need to come back in for a cast change. She did not allow them to remove the cast due to the fact that she did not feel like that was the issue whatsoever and indeed it does not appear that was the case her wound appears to be doing excellent today. 07/24/2020 upon evaluation today patient appears to be doing well in regard to her foot ulcer. She still has a small opening but this is showing signs of excellent improvement overall but they were very close to complete resolution. No fevers, chills, nausea, vomiting, or diarrhea. 07/31/2020 upon evaluation today patient actually appears to be doing excellent she is actually completely healed this is great news. Fortunately there is no signs of active infection at this time. No fevers, chills, nausea, vomiting, or diarrhea. The patient tells me that she did see Dr. Paulla Dolly in order to get to [place] so that she can have a custom shoe made. She saw him earlier last week. She does have an appointment with Liliane Channel on the sixth I believe on January she tells me Readmission: 08/28/2020 on evaluation today patient appears to be doing poorly in regard to her wound currently. She tells me this has reopened. The good news that she did get measured for and actually her shoes are on the way in from  [facility].. With that being said she has been trying to stay off of this is much as possible using her wheelchair around home. Nonetheless has been somewhat difficult. The good news is her shoes should be here next week 09/04/2020 upon evaluation today patient appears to be doing well with regard to her wound. There is a little bit of callus buildup but nothing too significant she does tell me she was very active this week. Fortunately there is no signs of systemic infection at this point. The dorsal foot wound actually appears to be doing better. I think this is healed. 09/11/2020 upon evaluation today patient appears to actually be doing quite well in my opinion based on what I am seeing today. Fortunately there is no signs of infection in fact her mother is certain that this is even open any longer based on what I see. Fortunately I think the patient has been doing everything she can to try to keep this under control. 09/18/2020 upon evaluation today patient appears to be doing about the same in regard to her foot ulcer. She has been tolerating the dressing changes without complication. Fortunately there is no sign of active infection at this time. No fevers, chills, nausea, vomiting, or diarrhea. She is going require some sharp debridement today. 09/25/2020 upon evaluation today patient appears to be doing well with regard to his wounds she has been tolerating the dressing changes without complication. Her wound appears to be completely healed based on what I am seeing at this point. There does not appear to be any signs of active infection at  this time which is great news. No fevers, chills, nausea, vomiting, or diarrhea. She did get the cushion for her foot as well that she ordered to try to help keep pressure off of this area. That looks like it may be very beneficial for her to be honest. 10/02/2020 upon evaluation today patient appears to be doing more poorly in regard to her foot ulcer.  Unfortunately this has reopened since we saw her last week. It apparently did not take too long at all for this to happen. She is obviously somewhat disappointed as she was hopeful that that would be time this thing would stay closed. Fortunately there is no evidence of active infection at this point. No fevers, chills, nausea, vomiting, or diarrhea. With that being said I am good have to perform a little bit of sharp debridement clear away some of the debris currently. Including a minimal amount of callus at this time. 10/16/2020 upon evaluation today patient appears to be doing about the same in regard to her foot ulcer unfortunately. There does not appear to be any signs of active infection which is great news. No fever chills noted she has been tolerating the dressing changes without complication which is great news. Unfortunately she tells me that she has been very depressed about the situation is getting very frustrating to her that she continues to have issues despite everything that she is trying to do to stay off of her foot. She is extremely discouraged and I do hate to hear this. Obviously we have been hopeful that if she got her shoes that would make a difference she is actually can be getting those tomorrow but I am not certain that that alone is good to be enough to get this to heal. We may end up having to consider going back into a total contact cast to get this to heal and then subsequently once we get her healed get her into her new diabetic shoes which will and my hope anyway keep this from reopening again. 10/23/2020 upon evaluation today patient appears to be doing a little worse both in regard to the size of her wound as well as in regard to the fact that she has erythema surrounding the wound and wrap around the lateral part of her foot where she tells me has been somewhat sore. Fortunately there does not appear to be any evidence of infection systemically but locally definitely  there is erythema and warmth consistent with local cellulitis. I am glad to remove some of the callus as well. 10/30/2020 upon evaluation today patient appears to be doing well with regard to her wound on the plantar aspect of her foot. This is significantly improved compared to last week. 10/30/2020 upon evaluation today patient actually is making excellent progress her wound appears to show signs of great improvement which is wonderful and that she is extremely pleased to hear this. She actually leaves Sunday to go on her trip with her half sister and friends. 11/13/2020 on evaluation today patient's wound actually appears to be doing quite well. There does not appear to be any signs of infection it has been 2 weeks since have seen her she does have a little bit of callus buildup here today but at the same time I do not believe the vacation time set her back any just has not really made a lot of significant improvement. She is done with her antibiotics at this point. Objective Constitutional Well-nourished and well-hydrated in no acute distress. Vitals  Time Taken: 1:04 PM, Height: 66 in, Weight: 245 lbs, BMI: 39.5, Temperature: 98.5 F, Pulse: 80 bpm, Respiratory Rate: 18 breaths/min, Blood Pressure: 143/82 mmHg. Respiratory normal breathing without difficulty. Psychiatric this patient is able to make decisions and demonstrates good insight into disease process. Alert and Oriented x 3. pleasant and cooperative. General Notes: Patient's wound bed did require sharp debridement to remove some of the callus around the edges of the wound as well as a minimal amount of slough of the surface of the wound down to good subcutaneous tissue. She tolerated that today without complication post debridement wound bed appears to be doing better. Integumentary (Hair, Skin) Wound #1RRR status is Open. Original cause of wound was Gradually Appeared. The date acquired was: 05/11/2019. The wound has been in treatment  43 weeks. The wound is located on the [place]. The wound measures 0.3cm length x 0.3cm width x 0.2cm depth; 0.071cm^2 area and 0.014cm^3 volume. There is Fat Layer (Subcutaneous Tissue) exposed. There is no tunneling or undermining noted. There is a medium amount of serosanguineous drainage noted. The wound margin is thickened. There is large (67-100%) pink, pale granulation within the wound bed. There is no necrotic tissue within the wound bed. Assessment Active Problems ICD-10 Type 2 diabetes mellitus with foot ulcer Non-pressure chronic ulcer of other part of left foot with fat layer exposed Charcot's joint, left ankle and foot Type 2 diabetes mellitus with diabetic neuropathy, unspecified Procedures Wound #1RRR Pre-procedure diagnosis of Wound #1RRR is a Diabetic Wound/Ulcer of the Lower Extremity located on the Left,Medial,Plantar Foot .Severity of Tissue Pre Debridement is: Fat layer exposed. There was a Excisional Skin/Subcutaneous Tissue Debridement with a total area of 1 sq cm performed by Worthy Keeler, PA. With the following instrument(s): Curette to remove Viable and Non-Viable tissue/material. Material removed includes Callus, Subcutaneous Tissue, Slough, and Skin: Epidermis after achieving pain control using Other (benzocaine 20% spray). No specimens were taken. A time out was conducted at 13:40, prior to the start of the procedure. A Minimum amount of bleeding was controlled with Pressure. The procedure was tolerated well with a pain level of 0 throughout and a pain level of 0 following the procedure. Post Debridement Measurements: 0.4cm length x 0.4cm width x 0.2cm depth; 0.025cm^3 volume. Character of Wound/Ulcer Post Debridement is improved. Severity of Tissue Post Debridement is: Fat layer exposed. Post procedure Diagnosis Wound #1RRR: Same as Pre-Procedure Plan Follow-up Appointments: Return Appointment in 1 week. Bathing/ Shower/ Hygiene: May shower  and wash wound with soap and water. - with dressing changes Off-Loading: Open toe surgical shoe to: - left foot Additional Orders / Instructions: Follow Nutritious Diet The following medication(s) was prescribed: triamcinolone acetonide topical 0.1 % ointment ointment topical apply in a thin film to the left thigh 2 times per day until healed starting 11/13/2020 WOUND #1RRR: - Foot Wound Laterality: Plantar, Left, Medial Cleanser: Wound Cleanser (Generic) Every Other Day/30 Days Discharge Instructions: Cleanse the wound with wound cleanser prior to applying a clean dressing using gauze sponges, not tissue or cotton balls. Prim Dressing: KerraCel Ag Gelling Fiber Dressing, 2x2 in (silver alginate) (Generic) Every Other Day/30 Days ary Discharge Instructions: Apply silver alginate to wound bed as instructed Secondary Dressing: Woven Gauze Sponges 2x2 in Every Other Day/30 Days Discharge Instructions: Apply over primary dressing as directed. Secondary Dressing: Optifoam Non-Adhesive Dressing, 4x4 in (Generic) Every Other Day/30 Days Discharge Instructions: Apply over primary dressing cut to make foam donut Secured With: Kerlix Roll Sterile, 4.5x3.1 (in/yd) (  Generic) Every Other Day/30 Days Discharge Instructions: Secure with Kerlix as directed. Secured With: 36M Medipore H Soft Cloth Surgical T 4 x 2 (in/yd) (Generic) Every Other Day/30 Days ape Discharge Instructions: Secure dressing with tape as directed. 1. Would recommend currently that we going continue with the wound care measures as before with regard to the dressings. We have been utilizing silver alginate dressings which I think is still a good option for now. I am afraid the collagen would just dry out in the wound and cause more trouble than good. 2. I am also can recommend that we going to see about initiating a total contact cast this is done well for her in the past and I think is a good point currently for [place] to go ahead and get  that going again as she does not appear to have any infection. We will see patient back for reevaluation in 1 week here in the clinic. If anything worsens or changes patient will contact our office for additional recommendations. Electronic Signature(s) Signed: 11/13/2020 2:04:56 PM By: Worthy Keeler PA-C Signed: 11/13/2020 2:04:56 PM By: Worthy Keeler PA-C Previous Signature: 11/13/2020 1:53:23 PM Version By: Worthy Keeler PA-C Entered By: Worthy Keeler on 11/13/2020 14:04:56 -------------------------------------------------------------------------------- SuperBill Details Patient Name: Date of Service: [name], [name] 11/13/2020 Medical Record Number: [id number] Patient Account Number: [id number] Date of Birth/Sex: Treating RN: 12/21/1945 (75 y.o. [name], [name] Primary Care Provider: [name] Other Clinician: Referring Provider: Treating Provider/Extender: Adele Schilder in Treatment: 80 Diagnosis Coding ICD-10 Codes Code Description E11.621 Type 2 diabetes mellitus with foot ulcer L97.522 Non-pressure chronic ulcer of other part of left foot with fat layer exposed M14.672 Charcot's joint, left ankle and foot E11.40 Type 2 diabetes mellitus with diabetic neuropathy, unspecified Facility Procedures CPT4 Code: 42395320 Description: 23343 - DEB SUBQ TISSUE 20 SQ CM/< ICD-10 Diagnosis Description L97.522 Non-pressure chronic ulcer of other part of left foot with fat layer exposed Modifier: Quantity: 1 Physician Procedures : CPT4 Code Description Modifier [id number] - WC PHYS SUBQ TISS 20 SQ CM ICD-10 Diagnosis Description L97.522 Non-pressure chronic ulcer of other part of left foot with fat layer exposed Quantity: 1 Electronic Signature(s) Signed: 11/13/2020 1:53:43 PM By: Worthy Keeler PA-C Entered By: Worthy Keeler on 11/13/2020 13:53:43

## 2020-11-14 NOTE — Progress Notes (Signed)
Subjective:    Patient ID: [name] [name], female    DOB: September 20, 1945, 75 y.o.   MRN: [id number]  HPI The patient is here for follow up of their chronic medical problems, including DM with neuropathy, Htn, hyperlipidemia, depression, anxiety, gout  Her foot wound healed but reopened in Feb.  She had a rash on her left leg - using steroid cream. Better but still there.   Has occ pin pricking sensation throughout body.  This is not new.  Her rheumatologist thought this was likely an allergy.  She has a little smell, but not taste.  She has difficulty eating.    Sugars are up and down - highest 205, lowest 98, 150.  She states she does not eat many sugars, but does eat probably too many carbohydrates which she understands trends in the sugars.     Medications and allergies reviewed with patient and updated if appropriate.  Patient Active Problem List   Diagnosis Date Noted  . Difficulty with speech 07/23/2020  . SOB (shortness of breath) 07/23/2020  . Right leg swelling 07/23/2020  . Fatigue 11/13/2019  . Dyslipidemia 11/12/2019  . Pityriasis lichenoides chronica 09/18/2019  . Nausea 09/18/2019  . Insomnia 08/07/2019  . Anxiety 08/07/2019  . Psoriatic arthritis ([place]) 08/07/2019  . H/O: gout 08/07/2019  . Recurrent UTI 08/07/2019  . Charcot's joint of left foot 08/07/2019  . Depression 08/07/2019  . Pneumonia due to COVID-19 virus 07/09/2019  . Physical deconditioning 07/08/2019  . COVID-19 07/07/2019  . Diabetic peripheral neuropathy ([place]) 04/10/2019  . Pain in left knee 10/13/2018  . Ischemic colitis ([place]) 10/06/2018  . Urinary incontinence 07/21/2018  . Diabetic foot ulcer ([place]) 07/13/2018  . Cellulitis of toe of left foot 05/02/2018  . DM2 (diabetes mellitus, type 2) ([place]) 05/02/2018  . Psoriasis 05/02/2018  . Osteoarthritis of knee 10/18/2017  . Chest pain 01/12/2015  . Right bundle branch block 05/20/2014  . OSA (obstructive sleep apnea), BiPAP 03/12/2013  .  Essential hypertension 03/12/2013  . Obesity 09/14/2012  . Hyponatremia 09/14/2012  . S/P right THA, AA 09/13/2012  . Deep venous thrombosis of lower extremity ([place]) 08/10/2010  . 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 acetaminophen (TYLENOL) 325 MG tablet Take 2 tablets (650 mg total) by mouth every 6 (six) hours as needed for mild pain or headache.    . allopurinol (ZYLOPRIM) 100 MG tablet Take 1 tablet (100 mg total) by mouth 2 (two) times daily. 180 tablet 3  . ALPRAZolam (XANAX) 0.25 MG tablet TAKE 1 TABLET(0.25 MG) BY MOUTH TWICE DAILY AS NEEDED FOR ANXIETY 20 tablet 0  . ascorbic acid (VITAMIN C) 500 MG tablet Take 500 mg by mouth at bedtime.    Marland Kitchen aspirin 81 MG tablet Take 81 mg by mouth See admin instructions. Every 3 days    . azelastine (ASTELIN) 0.1 % nasal spray Place 2 sprays into both nostrils 2 (two) times daily. Use in each nostril as directed (Patient taking differently: Place 2 sprays into both nostrils at bedtime. Use in each nostril as directed) 30 mL 12  . Budesonide 9 MG TB24 Take 9 mg by mouth daily.     . carvedilol (COREG) 6.25 MG tablet Take 1 tablet (6.25 mg total) by mouth 2 (two) times daily with a meal. 60 tablet 3  . Certolizumab Pegol (CIMZIA [place]) Inject 400 mg into the skin every 30 (thirty)  days. 200 mg on each side of stomach    . Cholecalciferol (VITAMIN D3) 1000 UNITS CAPS Take 1,000 Units by mouth 2 (two) times daily.     . citalopram (CELEXA) 40 MG tablet TAKE 1 TABLET(40 MG) BY MOUTH DAILY 90 tablet 3  . Cyanocobalamin (VITAMIN B-12) 1000 MCG SUBL Place 1,000 mcg under the tongue daily.    . cyclobenzaprine (FLEXERIL) 10 MG tablet TAKE 1 TABLET BY MOUTH AT BEDTIME (Patient taking differently: Take 10 mg by mouth at bedtime.) 90 tablet 1  . folic acid (FOLVITE) 1 MG tablet TAKE 1 TABLET(1 MG) BY MOUTH DAILY (Patient taking differently: Take 1 mg by mouth daily.)  90 tablet 3  . furosemide (LASIX) 20 MG tablet Take 1 tablet (20 mg total) by mouth daily. 5 tablet 0  . Insulin Pen Needle (B-D UF III MINI PEN NEEDLES) 31G X 5 MM MISC Use daily for insulin pen 90 each 3  . LEVEMIR FLEXTOUCH 100 UNIT/ML FlexPen ADMINISTER 14 UNITS UNDER THE SKIN TWICE DAILY 15 mL 2  . lisinopril-hydrochlorothiazide (ZESTORETIC) 20-25 MG tablet TAKE 1 TABLET BY MOUTH DAILY 90 tablet 1  . loperamide (IMODIUM) 2 MG capsule Take 6 mg by mouth 2 (two) times daily.    . Melatonin 5 MG SUBL Place 5 mg under the tongue at bedtime.    . metFORMIN (GLUCOPHAGE-XR) 500 MG 24 hr tablet Take 1 tablet (500 mg total) by mouth 2 (two) times daily before a meal.    . methotrexate (RHEUMATREX) 2.5 MG tablet Take 20 mg by mouth every Friday.     . naftifine (NAFTIN) 1 % cream Apply 1 application topically daily as needed (rash).     Marland Kitchen omeprazole (PRILOSEC) 40 MG capsule TAKE 1 CAPSULE BY MOUTH EVERY DAY 90 capsule 1  . ondansetron (ZOFRAN ODT) 4 MG disintegrating tablet Take 1 tablet (4 mg total) by mouth every 8 (eight) hours as needed for nausea or vomiting. 30 tablet 0  . potassium chloride (KLOR-CON) 10 MEQ tablet TAKE 2 TABLETS(20 MEQ) BY MOUTH DAILY 180 tablet 3  . prednisoLONE acetate (PRED FORTE) 1 % ophthalmic suspension Place 4 drops into the right eye See admin instructions. 4 drops  daily for 1 week, 2 drops daily until post op. Surgery 07/11/20    . Probiotic Product (ALIGN PO) Take 1 capsule by mouth daily.    . rosuvastatin (CRESTOR) 5 MG tablet Take 1 tablet (5 mg total) by mouth daily. (Patient taking differently: Take 5 mg by mouth at bedtime.) 90 tablet 3  . triamcinolone cream (KENALOG) 0.1 % Apply 1 application topically 2 (two) times daily. (Patient taking differently: Apply 1 application topically 2 (two) times daily as needed (rash).) 30 g 0  . LORazepam (ATIVAN) 0.5 MG tablet 1 tablet at bedtime as needed    . STOOL SOFTENER 100 MG capsule Take 100 mg by mouth 2 (two) times  daily.    Marland Kitchen triamcinolone ointment (KENALOG) 0.1 % Apply 1 application topically 2 (two) times daily.     No current facility-administered medications on file prior to visit.    Past Medical History:  Diagnosis Date  . Arthritis   . Closed fracture of fifth metatarsal bone 05/13/2018  . Deep vein thrombosis ([place])    right calf - 05/2012   . Diabetes mellitus without complication (HCC)    diet controlled   . GERD (gastroesophageal reflux disease)   . Hyperlipidemia   . Hypertension    Ejection fraction =>55% Left  ventricular systolic function is normal. Left ventricular wall motion is normal    . Lymphocytic colitis   . Neuropathy    diabetic - in bilateral feet   . Pityriasis lichenoides chronica   . Sleep apnea    bipap    Past Surgical History:  Procedure Laterality Date  . DILATION AND CURETTAGE OF UTERUS    . EYE SURGERY  07/2020  . HAMMER TOE SURGERY    . right hand surgery      due to blood infection   . torn meniscus repair      right knee   . TOTAL HIP ARTHROPLASTY  09/13/2012   Procedure: TOTAL HIP ARTHROPLASTY ANTERIOR APPROACH;  Surgeon: Mauri Pole, MD;  Location: [facility];  Service: Orthopedics;  Laterality: Right;    Social History   Socioeconomic History  . Marital status: Divorced    Spouse name: Not on file  . Number of children: Not on file  . Years of education: Not on file  . Highest education level: Not on file  Occupational History  . Not on file  Tobacco Use  . Smoking status: Never Smoker  . Smokeless tobacco: Never Used  Substance and Sexual Activity  . Alcohol use: Yes    Comment: occasional wine   . Drug use: No  . Sexual activity: Not on file  Other Topics Concern  . Not on file  Social History Narrative   She is widowed. She has 2 children, 2 grandchildren. She does drink occasional alcohol . There is no tobacco . She does not have a routine exercise program   Social Determinants of Health   Financial Resource Strain: Low Risk    . Difficulty of Paying Living Expenses: Not hard at all  Food Insecurity: No Food Insecurity  . Worried About Charity fundraiser in the Last Year: Never true  . Ran Out of Food in the Last Year: Never true  Transportation Needs: No Transportation Needs  . Lack of Transportation (Medical): No  . Lack of Transportation (Non-Medical): No  Physical Activity: Inactive  . Days of Exercise per Week: 0 days  . Minutes of Exercise per Session: 0 min  Stress: No Stress Concern Present  . Feeling of Stress : Not at all  Social Connections: Unknown  . Frequency of Communication with Friends and Family: More than three times a week  . Frequency of Social Gatherings with Friends and Family: Once a week  . Attends Religious Services: Patient refused  . Active Member of Clubs or Organizations: Patient refused  . Attends Archivist Meetings: Patient refused  . Marital Status: Widowed    Family History  Problem Relation Age of Onset  . Hypertension Mother     Review of Systems  Constitutional: Negative for chills and fever.  Respiratory: Negative for cough, shortness of breath and wheezing.   Cardiovascular: Positive for leg swelling (controlled). Negative for chest pain and palpitations.  Neurological: Negative for light-headedness and headaches.       Objective:   Vitals:   11/15/20 1116  BP: 138/70  Pulse: 87  Temp: 98.4 F (36.9 C)  SpO2: 97%   BP Readings from Last 3 Encounters:  11/15/20 138/70  07/23/20 (!) 146/78  07/12/20 (!) 98/52   Wt Readings from Last 3 Encounters:  03/12/20 245 lb (111.1 kg)  01/12/20 245 lb (111.1 kg)  07/13/19 268 lb 15.4 oz (122 kg)   There is no height or weight on file  to calculate BMI.   Physical Exam    Constitutional: Appears well-developed and well-nourished. No distress.  HENT:  Head: Normocephalic and atraumatic.  Neck: Neck supple. No tracheal deviation present. No thyromegaly present.  No cervical  lymphadenopathy Cardiovascular: Normal rate, regular rhythm and normal heart sounds.   No murmur heard. No carotid bruit .  Bilateral lower leg mild edema- LLE > RLE Pulmonary/Chest: Effort normal and breath sounds normal. No respiratory distress. No has no wheezes. No rales.  Skin: Skin is warm and dry. Not diaphoretic.  Psychiatric: Normal mood and affect. Behavior is normal.      Assessment & Plan:    See Problem List for Assessment and Plan of chronic medical problems.    This visit occurred during the SARS-CoV-2 public health emergency.  Safety protocols were in place, including screening questions prior to the visit, additional usage of staff PPE, and extensive cleaning of exam room while observing appropriate contact time as indicated for disinfecting solutions.

## 2020-11-14 NOTE — Patient Instructions (Addendum)
    Blood work was ordered.      Medications changes include :   none     Please followup in 6 months  

## 2020-11-14 NOTE — Progress Notes (Signed)
[name] [name] ([id number]) Visit Report for 11/13/2020 Arrival Information Details Patient Name: Date of Service: [name] [name], [name] E. 11/13/2020 12:30 PM Medical Record Number: [id number] Patient Account Number: [id number] Date of Birth/Sex: Treating RN: 04-Mar-1946 (75 y.o. [name] [name], [name] Primary Care Marca Gadsby: Cheryll Cockayne Other Clinician: Referring Talonda Artist: Treating Lelani Garnett/Extender: [name] [name] in Treatment: 34 Visit Information History Since Last Visit Added or deleted any medications: No Patient Arrived: Wheel Chair Any new allergies or adverse reactions: No Arrival Time: 13:03 Had a fall or experienced change in No Accompanied By: self activities of daily living that may affect Transfer Assistance: None risk of falls: Patient Identification Verified: Yes Signs or symptoms of abuse/neglect since last visito No Secondary Verification Process Completed: Yes Hospitalized since last visit: No Patient Requires Transmission-Based Precautions: No Implantable device outside of the clinic excluding No Patient Has Alerts: No cellular tissue based products placed in the center since last visit: Has Dressing in Place as Prescribed: Yes Pain Present Now: No Electronic Signature(s) Signed: 11/14/2020 4:58:23 PM By: [name] [name] Entered By: [name] [name] on 11/13/2020 13:04:08 -------------------------------------------------------------------------------- Encounter Discharge Information Details Patient Name: Date of Service: [name] [name] 11/13/2020 12:30 PM Medical Record Number: [id number] Patient Account Number: [id number] Date of Birth/Sex: Treating RN: December 07, 1945 (75 y.o. [name] [name], [name] [name] Primary Care Mitsuru Dault: Cheryll Cockayne Other Clinician: Referring Erric Machnik: Treating Torianne Laflam/Extender: [name] [name] in Treatment: 48 Encounter Discharge Information Items Post Procedure Vitals Discharge Condition: Stable Temperature  (F): 98.5 Ambulatory Status: Wheelchair Pulse (bpm): 80 Discharge Destination: Home Respiratory Rate (breaths/min): 18 Transportation: Private Auto Blood Pressure (mmHg): 143/82 Accompanied By: self Schedule Follow-up Appointment: Yes Clinical Summary of Care: Patient Declined Electronic Signature(s) Signed: 11/13/2020 5:28:50 PM By: [name] Mu RN Entered By: [name] [name] on 11/13/2020 14:16:33 -------------------------------------------------------------------------------- Lower Extremity Assessment Details Patient Name: Date of Service: [name] [name] 11/13/2020 12:30 PM Medical Record Number: [id number] Patient Account Number: [id number] Date of Birth/Sex: Treating RN: [birth year] (75 y.o. [name] [name] Primary Care Satara Virella: Cheryll Cockayne Other Clinician: Referring Shataria Crist: Treating Kemal Amores/Extender: [name] [name] Weeks in Treatment: 43 Edema Assessment Assessed: [Left: No] [Right: No] Edema: [Left: Ye] [Right: s] Calf Left: Right: Point of Measurement: 29 cm From Medial Instep 34 cm Ankle Left: Right: Point of Measurement: 12 cm From Medial Instep 21 cm Vascular Assessment Pulses: Dorsalis Pedis Palpable: [Left:Yes] Electronic Signature(s) Signed: 11/13/2020 5:44:43 PM By: Zandra Abts RN, BSN Entered By: Zandra Abts on 11/13/2020 13:16:30 -------------------------------------------------------------------------------- Multi-Disciplinary Care Plan Details Patient Name: Date of Service: [name] [name] 11/13/2020 12:30 PM Medical Record Number: [id number] Patient Account Number: [id number] Date of Birth/Sex: Treating RN: 1946-06-28 (75 y.o. [name] [name] Primary Care Kaybree Williams: Cheryll Cockayne Other Clinician: Referring Kalani Sthilaire: Treating [name] [name]/Extender: [name] [name] in Treatment: 76 Multidisciplinary Care Plan reviewed with physician Active Inactive Nutrition Nursing Diagnoses: Impaired  glucose control: actual or potential Potential for alteratiion in Nutrition/Potential for imbalanced nutrition Goals: Patient/caregiver verbalizes understanding of need to maintain therapeutic glucose control per primary care physician Date Initiated: 01/17/2020 Date Inactivated: 02/14/2020 Target Resolution Date: 02/14/2020 Goal Status: Met Patient/caregiver will maintain therapeutic glucose control Date Initiated: 01/17/2020 Target Resolution Date: 11/20/2020 Goal Status: Active Interventions: Assess HgA1c results as ordered upon admission and as needed Assess patient nutrition upon admission and as needed per policy Provide education on elevated blood sugars and impact on wound healing Treatment Activities: Patient referred to Primary Care Physician for  further nutritional evaluation : 01/17/2020 Notes: Wound/Skin Impairment Nursing Diagnoses: Impaired tissue integrity Knowledge deficit related to ulceration/compromised skin integrity Goals: Patient/caregiver will verbalize understanding of skin care regimen Date Initiated: 01/17/2020 Target Resolution Date: 11/20/2020 Goal Status: Active Ulcer/skin breakdown will have a volume reduction of 30% by week 4 Date Initiated: 01/17/2020 Date Inactivated: 02/14/2020 Target Resolution Date: 02/14/2020 Goal Status: Met Ulcer/skin breakdown will have a volume reduction of 50% by week 8 Date Initiated: 02/14/2020 Date Inactivated: 03/13/2020 Target Resolution Date: 03/13/2020 Goal Status: Met Ulcer/skin breakdown will have a volume reduction of 80% by week 12 Date Initiated: 03/13/2020 Date Inactivated: 04/10/2020 Target Resolution Date: 04/10/2020 Goal Status: Unmet Unmet Reason: infection Interventions: Assess patient/caregiver ability to obtain necessary supplies Assess patient/caregiver ability to perform ulcer/skin care regimen upon admission and as needed Assess ulceration(s) every visit Provide education on ulcer and skin care Treatment  Activities: Skin care regimen initiated : 01/17/2020 Topical wound management initiated : 01/17/2020 Notes: Electronic Signature(s) Signed: 11/13/2020 5:59:57 PM By: [name] RN, BSN Entered By: [name] on 11/13/2020 13:42:51 -------------------------------------------------------------------------------- Pain Assessment Details Patient Name: Date of Service: [name] Amato RA E. 11/13/2020 12:30 PM Medical Record Number: [id number] Patient Account Number: [id number] Date of Birth/Sex: Treating RN: Mar 05, 1946 (75 y.o. [name] [name] Primary Care Maneh Sieben: [name] Other Clinician: Referring Jaziel Bennett: Treating Orly Quimby/Extender: [name] [name] in Treatment: 19 Active Problems Location of Pain Severity and Description of Pain Patient Has Paino No Site Locations Pain Management and Medication Current Pain Management: Electronic Signature(s) Signed: 11/13/2020 5:59:57 PM By: [name] RN, BSN Signed: 11/14/2020 4:58:23 PM By: [name] [name] Entered By: [name] [name] on 11/13/2020 13:05:03 -------------------------------------------------------------------------------- Patient/Caregiver Education Details Patient Name: Date of Service: [name] Amato RA E. 4/6/2022andnbsp12:30 PM Medical Record Number: [id number] Patient Account Number: [id number] Date of Birth/Gender: Treating RN: [birth year] (75 y.o. [name] [name] Primary Care Physician: [name] Other Clinician: Referring Physician: Treating Physician/Extender: [name] [name] in Treatment: 59 Education Assessment Education Provided To: Patient Education Topics Provided Offloading: Methods: Explain/Verbal Responses: Reinforcements needed, State content correctly Wound/Skin Impairment: Methods: Explain/Verbal Responses: Reinforcements needed, State content correctly Electronic Signature(s) Signed: 11/13/2020 5:59:57 PM By: [name] RN, BSN Entered  By: [name] on 11/13/2020 13:43:26 -------------------------------------------------------------------------------- Wound Assessment Details Patient Name: Date of Service: [name] Amato RA E. 11/13/2020 12:30 PM Medical Record Number: [id number] Patient Account Number: [id number] Date of Birth/Sex: Treating RN: 10/17/1945 (75 y.o. [name] [name] Primary Care Copeland Lapier: [name] Other Clinician: Referring Kedra Mcglade: Treating Romilda Proby/Extender: Landis Martins Weeks in Treatment: 43 Wound Status Wound Number: [id number] Primary Diabetic Wound/Ulcer of the Lower Extremity Etiology: Wound Location: Left, Medial, Plantar Foot Wound Open Wounding Event: Gradually Appeared Status: Date Acquired: 05/11/2019 Comorbid Sleep Apnea, Deep Vein Thrombosis, Hypertension, Colitis, Type Weeks Of Treatment: 43 History: II Diabetes, Gout, Osteoarthritis, Neuropathy Clustered Wound: No Photos Wound Measurements Length: (cm) 0.3 Width: (cm) 0.3 Depth: (cm) 0.2 Area: (cm) 0.071 Volume: (cm) 0.014 % Reduction in Area: 93% % Reduction in Volume: 96.6% Epithelialization: Large (67-100%) Tunneling: No Undermining: No Wound Description Classification: Grade 2 Wound Margin: Thickened Exudate Amount: Medium Exudate Type: Serosanguineous Exudate Color: red, brown Foul Odor After Cleansing: No Slough/Fibrino No Wound Bed Granulation Amount: Large (67-100%) Exposed Structure Granulation Quality: Pink, Pale Fascia Exposed: No Necrotic Amount: None Present (0%) Fat Layer (Subcutaneous Tissue) Exposed: Yes Tendon Exposed: No Muscle Exposed: No Joint Exposed: No Bone Exposed: No Treatment Notes Wound #1RRR (Foot) Wound  Laterality: Plantar, Left, Medial Cleanser Wound Cleanser Discharge Instruction: Cleanse the wound with wound cleanser prior to applying a clean dressing using gauze sponges, not tissue or cotton balls. Peri-Wound Care Topical Primary Dressing KerraCel  Ag Gelling Fiber Dressing, 2x2 in (silver alginate) Discharge Instruction: Apply silver alginate to wound bed as instructed Secondary Dressing Woven Gauze Sponges 2x2 in Discharge Instruction: Apply over primary dressing as directed. Optifoam Non-Adhesive Dressing, 4x4 in Discharge Instruction: Apply over primary dressing cut to make foam donut Secured With [employer] Sterile, 4.5x3.1 (in/yd) Discharge Instruction: Secure with Kerlix as directed. 59M Medipore H Soft Cloth Surgical T 4 x 2 (in/yd) ape Discharge Instruction: Secure dressing with tape as directed. Compression Wrap Compression Stockings Add-Ons Electronic Signature(s) Signed: 11/13/2020 5:59:57 PM By: [name] RN, BSN Signed: 11/14/2020 4:58:23 PM By: [name] [name] Entered By: [name] [name] on 11/13/2020 16:08:09 -------------------------------------------------------------------------------- Vitals Details Patient Name: Date of Service: [place], [name] 11/13/2020 12:30 PM Medical Record Number: [id number] Patient Account Number: [id number] Date of Birth/Sex: Treating RN: Jun 13, 1946 (76 y.o. [name] [name] Primary Care Wandra Babin: [name] Other Clinician: Referring Elodia Haviland: Treating Waymon Laser/Extender: [name] [name] in Treatment: 42 Vital Signs Time Taken: 13:04 Temperature (F): 98.5 Height (in): 66 Pulse (bpm): 80 Weight (lbs): 245 Respiratory Rate (breaths/min): 18 Body Mass Index (BMI): 39.5 Blood Pressure (mmHg): 143/82 Reference Range: 80 - 120 mg / dl Electronic Signature(s) Signed: 11/14/2020 4:58:23 PM By: [name] [name] Entered By: [name] [name] on 11/13/2020 13:04:55

## 2020-11-15 ENCOUNTER — Encounter: Payer: Self-pay | Admitting: Internal Medicine

## 2020-11-15 ENCOUNTER — Other Ambulatory Visit: Payer: Self-pay

## 2020-11-15 ENCOUNTER — Ambulatory Visit (INDEPENDENT_AMBULATORY_CARE_PROVIDER_SITE_OTHER): Payer: Medicare Other | Admitting: Internal Medicine

## 2020-11-15 VITALS — BP 138/70 | HR 87 | Temp 98.4°F

## 2020-11-15 DIAGNOSIS — I10 Essential (primary) hypertension: Secondary | ICD-10-CM | POA: Diagnosis not present

## 2020-11-15 DIAGNOSIS — F3289 Other specified depressive episodes: Secondary | ICD-10-CM

## 2020-11-15 DIAGNOSIS — D692 Other nonthrombocytopenic purpura: Secondary | ICD-10-CM | POA: Diagnosis not present

## 2020-11-15 DIAGNOSIS — E1142 Type 2 diabetes mellitus with diabetic polyneuropathy: Secondary | ICD-10-CM | POA: Diagnosis not present

## 2020-11-15 DIAGNOSIS — F419 Anxiety disorder, unspecified: Secondary | ICD-10-CM | POA: Diagnosis not present

## 2020-11-15 DIAGNOSIS — Z8739 Personal history of other diseases of the musculoskeletal system and connective tissue: Secondary | ICD-10-CM | POA: Diagnosis not present

## 2020-11-15 DIAGNOSIS — M109 Gout, unspecified: Secondary | ICD-10-CM

## 2020-11-15 DIAGNOSIS — E785 Hyperlipidemia, unspecified: Secondary | ICD-10-CM | POA: Diagnosis not present

## 2020-11-15 LAB — COMPREHENSIVE METABOLIC PANEL
ALT: 45 U/L — ABNORMAL HIGH (ref 0–35)
AST: 33 U/L (ref 0–37)
Albumin: 3.2 g/dL — ABNORMAL LOW (ref 3.5–5.2)
Alkaline Phosphatase: 49 U/L (ref 39–117)
BUN: 12 mg/dL (ref 6–23)
CO2: 31 mEq/L (ref 19–32)
Calcium: 9.2 mg/dL (ref 8.4–10.5)
Chloride: 96 mEq/L (ref 96–112)
Creatinine, Ser: 0.63 mg/dL (ref 0.40–1.20)
GFR: 86.85 mL/min (ref >60.00)
Glucose, Bld: 220 mg/dL — ABNORMAL HIGH (ref 70–99)
Potassium: 3.3 mEq/L — ABNORMAL LOW (ref 3.5–5.1)
Sodium: 136 mEq/L (ref 135–145)
Total Bilirubin: 0.3 mg/dL (ref 0.2–1.2)
Total Protein: 5.8 g/dL — ABNORMAL LOW (ref 6.0–8.3)

## 2020-11-15 LAB — CBC WITH DIFFERENTIAL/PLATELET
Basophils Absolute: 0 10*3/uL (ref 0.0–0.1)
Basophils Relative: 0.3 % (ref 0.0–3.0)
Eosinophils Absolute: 0.1 10*3/uL (ref 0.0–0.7)
Eosinophils Relative: 1.4 % (ref 0.0–5.0)
HCT: 31.7 % — ABNORMAL LOW (ref 36.0–46.0)
Hemoglobin: 10.5 g/dL — ABNORMAL LOW (ref 12.0–15.0)
Lymphocytes Relative: 21.8 % (ref 12.0–46.0)
Lymphs Abs: 2 10*3/uL (ref 0.7–4.0)
MCHC: 33 g/dL (ref 30.0–36.0)
MCV: 89.8 fl (ref 78.0–100.0)
Monocytes Absolute: 0.5 10*3/uL (ref 0.1–1.0)
Monocytes Relative: 5.4 % (ref 3.0–12.0)
Neutro Abs: 6.6 10*3/uL (ref 1.4–7.7)
Neutrophils Relative %: 71.1 % (ref 43.0–77.0)
Platelets: 200 10*3/uL (ref 150.0–400.0)
RBC: 3.53 Mil/uL — ABNORMAL LOW (ref 3.87–5.11)
RDW: 21.4 % — ABNORMAL HIGH (ref 11.5–15.5)
WBC: 9.3 10*3/uL (ref 4.0–10.5)

## 2020-11-15 LAB — LIPID PANEL
Cholesterol: 114 mg/dL (ref 0–200)
HDL: 44.3 mg/dL (ref >39.00)
LDL Cholesterol: 41 mg/dL (ref 0–99)
NonHDL: 69.88
Total CHOL/HDL Ratio: 3
Triglycerides: 143 mg/dL (ref 0.0–149.0)
VLDL: 28.6 mg/dL (ref 0.0–40.0)

## 2020-11-15 LAB — URIC ACID: Uric Acid, Serum: 4.3 mg/dL (ref 2.4–7.0)

## 2020-11-15 LAB — HEMOGLOBIN A1C: Hgb A1c MFr Bld: 8.5 % — ABNORMAL HIGH (ref 4.6–6.5)

## 2020-11-15 NOTE — Telephone Encounter (Signed)
Left message that I will pull a download and give to Dr Claiborne Billings for pressure orders and make the changes accordingly.

## 2020-11-15 NOTE — Assessment & Plan Note (Signed)
Chronic Not truly controlled She does find herself being more emotional and it could be because she has been on the citalopram for a long period of time and it is less effective Discussed possibly transitioning her to something else, but she would like to hold off on that for now Continue citalopram 40 mg daily Can consider fluoxetine or sertraline in the future-she has not been on either

## 2020-11-15 NOTE — Assessment & Plan Note (Signed)
Chronic Blood pressure controlled Continue carvedilol 6.25 mg twice daily, furosemide 20 mg daily, lisinopril-hydrochlorothiazide 20-25 mg daily CMP

## 2020-11-15 NOTE — Assessment & Plan Note (Signed)
Chronic No gout symptoms Continue allopurinol 100 mg twice daily We will check uric acid level

## 2020-11-15 NOTE — Assessment & Plan Note (Signed)
Chronic Following with podiatry We will try to get sugars better controlled

## 2020-11-15 NOTE — Assessment & Plan Note (Signed)
Chronic She has significant purpura bilateral distal upper extremities She has a small skin tear on her left forearm that is not infected Reassured She is moisturizing her arms

## 2020-11-15 NOTE — Assessment & Plan Note (Signed)
Chronic Anxiety fairly controlled, but does note that she is a little more emotional recently ?  Is citalopram as effective as it used to be Discussed that we can consider changing it-this is not the best time right now so we will hold off, but may consider that in the near future Continue citalopram 40 mg daily Continue alprazolam 0.25 mg twice daily as needed

## 2020-11-15 NOTE — Assessment & Plan Note (Signed)
Chronic Sugars variable at home and do not sound well controlled She is limited because she is not able to be more active Encouraged as much improvement with diet as possible Continue Metformin 500 mg twice daily-I do not think we should try to increase this because of her colon issues Continue Levemir 14 units daily I will discuss with her pharmacist what other options she has based on her insurance Check A1c

## 2020-11-15 NOTE — Assessment & Plan Note (Signed)
Chronic Check lipid panel, CMP Continue rosuvastatin 5 mg daily

## 2020-11-16 ENCOUNTER — Encounter: Payer: Self-pay | Admitting: Internal Medicine

## 2020-11-17 ENCOUNTER — Other Ambulatory Visit: Payer: Self-pay | Admitting: Internal Medicine

## 2020-11-17 ENCOUNTER — Encounter: Payer: Self-pay | Admitting: Internal Medicine

## 2020-11-17 MED ORDER — POTASSIUM CHLORIDE CRYS ER 10 MEQ PO TBCR: 30.0000 meq | EXTENDED_RELEASE_TABLET | Freq: Every day | ORAL | 3 refills | Status: DC

## 2020-11-20 ENCOUNTER — Other Ambulatory Visit: Payer: Self-pay

## 2020-11-20 ENCOUNTER — Other Ambulatory Visit (HOSPITAL_COMMUNITY)
Admission: RE | Admit: 2020-11-20 | Discharge: 2020-11-20 | Disposition: A | Discharge status: Home / Self Care | Payer: Medicare Other | Attending: *Deleted | Admitting: *Deleted

## 2020-11-20 ENCOUNTER — Encounter (HOSPITAL_BASED_OUTPATIENT_CLINIC_OR_DEPARTMENT_OTHER): Payer: Medicare Other | Admitting: Physician Assistant

## 2020-11-20 ENCOUNTER — Ambulatory Visit (HOSPITAL_COMMUNITY)
Admission: RE | Admit: 2020-11-20 | Discharge: 2020-11-20 | Disposition: A | Discharge status: Home / Self Care | Payer: Medicare Other | Source: Ambulatory Visit | Attending: Physician Assistant | Admitting: Physician Assistant

## 2020-11-20 ENCOUNTER — Other Ambulatory Visit (HOSPITAL_COMMUNITY): Payer: Self-pay | Admitting: Physician Assistant

## 2020-11-20 DIAGNOSIS — M7989 Other specified soft tissue disorders: Secondary | ICD-10-CM | POA: Diagnosis not present

## 2020-11-20 DIAGNOSIS — L97522 Non-pressure chronic ulcer of other part of left foot with fat layer exposed: Secondary | ICD-10-CM | POA: Insufficient documentation

## 2020-11-20 DIAGNOSIS — L089 Local infection of the skin and subcutaneous tissue, unspecified: Secondary | ICD-10-CM | POA: Insufficient documentation

## 2020-11-20 DIAGNOSIS — E11621 Type 2 diabetes mellitus with foot ulcer: Secondary | ICD-10-CM | POA: Diagnosis not present

## 2020-11-20 DIAGNOSIS — Z872 Personal history of diseases of the skin and subcutaneous tissue: Secondary | ICD-10-CM | POA: Diagnosis not present

## 2020-11-20 DIAGNOSIS — M19072 Primary osteoarthritis, left ankle and foot: Secondary | ICD-10-CM | POA: Diagnosis not present

## 2020-11-20 NOTE — Progress Notes (Addendum)
[name], [name] ([id number]) Visit Report for 11/20/2020 Chief Complaint Document Details Patient Name: Date of Service: [name] [name], [name] E. 11/20/2020 11:00 A M Medical Record Number: [id number] Patient Account Number: [id number] Date of Birth/Sex: Treating RN: 08-27-1945 (75 y.o. [name] Other Clinician: Referring Provider: Treating Provider/Extender: Adele Schilder in Treatment: 46 Information Obtained from: Patient Chief Complaint Left foot ulcer Electronic Signature(s) Signed: 11/20/2020 11:29:04 AM By: Worthy Keeler PA-C Entered By: Worthy Keeler on 11/20/2020 11:29:03 -------------------------------------------------------------------------------- Debridement Details Patient Name: Date of Service: [name] [name] 11/20/2020 11:00 A M Medical Record Number: [id number] Patient Account Number: [id number] Date of Birth/Sex: Treating RN: 08/16/1945 (75 y.o. [name] Other Clinician: Referring Provider: Treating Provider/Extender: Adele Schilder in Treatment: 44 Debridement Performed for Assessment: Wound #[id number] Left,Medial,Plantar Foot Performed By: Physician Worthy Keeler, PA Debridement Type: Debridement Severity of Tissue Pre Debridement: Fat layer exposed Level of Consciousness (Pre-procedure): Awake and Alert Pre-procedure Verification/Time Out Yes - 12:00 Taken: Start Time: 12:02 Pain Control: Other : benzocaine 20% spray T Area Debrided (L x W): otal 2.5 (cm) x 3.5 (cm) = 8.75 (cm) Tissue and other material debrided: Viable, Non-Viable, Callus, Slough, Subcutaneous, Skin: Epidermis, Slough Level: Skin/Subcutaneous Tissue Debridement Description: Excisional Instrument: Curette Specimen: Swab, Number of Specimens T aken: 1 Bleeding: Minimum Hemostasis Achieved: Pressure End Time: 12:07 Procedural Pain: 0 Post Procedural Pain:  0 Response to Treatment: Procedure was tolerated well Level of Consciousness (Post- Awake and Alert procedure): Post Debridement Measurements of Total Wound Length: (cm) 1.5 Width: (cm) 1.8 Depth: (cm) 0.2 Volume: (cm) 0.424 Character of Wound/Ulcer Post Debridement: Improved Severity of Tissue Post Debridement: Fat layer exposed Post Procedure Diagnosis Same as Pre-procedure Electronic Signature(s) Signed: 11/20/2020 5:03:59 PM By: Worthy Keeler PA-C Signed: 11/20/2020 5:38:32 PM By: [name] RN, BSN Entered By: [name] on 11/20/2020 12:11:53 -------------------------------------------------------------------------------- HPI Details Patient Name: Date of Service: [name] [name], [name] RA E. 11/20/2020 11:00 A M Medical Record Number: [id number] Patient Account Number: [id number] Date of Birth/Sex: Treating RN: [birth year] (75 y.o. [name] Other Clinician: Referring Provider: Treating Provider/Extender: Adele Schilder in Treatment: 39 History of Present Illness HPI Description: 01/17/2020 upon evaluation today patient presents for initial evaluation here in our clinic concerning issues she has been having with a left medial/plantar foot ulcer. This is actually been an issue for her since October 2020. She has been seeing Dr. Doran Durand for quite some time during that course. Fortunately there is no signs of active infection at this time. Or least no mention of this to have seen in general. With that being said unfortunately I do see some signs of erythema noted today that does have me concerned about the possibility of infection at this point in the surrounding area of the wound. There is also a warm to touch at the site which is somewhat concerning. Fortunately there is no evidence of systemic infection which is great news. The patient does have a history of diabetes mellitus type 2, Charcot foot which is what led to  the wound, and hypertension. She notes that she was in a cast for some time with Dr. Doran Durand for about 8 weeks. During that time they were utilizing according to the patient silver nitrate along with a foam doughnut and then Coban to secure in place in the cast  in place. With that being said I do not have the actual records to review we are going to try to get a hold of those unfortunately they would not flow over into care everywhere I did look today. She has been seeing Dr. Doran Durand and his physician assistant Larkin Ina up until the end of May and apparently is still seeing them on a regular basis every 2 weeks roughly. She has also tried Iodosorb without effect here. 01/24/2020 upon evaluation today patient actually appears to be doing quite well with regard to her wounds. She has been tolerating the dressing changes without complication. Fortunately there is no signs of active infection spreading which is good news. Her culture did show signs of Staph aureus I did place her on Augmentin due to the erythema surrounding the wound. With that being said the wound does appear to be doing better she has her longer walking cast/boot and I think that is actually good for her for the time being. I am considering reinitiating total contact cast when she gets back from vacation but next week she will actually be out of town at [employer] she knows not to get in the water but she still obviously is planning to enjoy herself she is going to take it easy on her foot however. 02/07/2020 upon evaluation today patient appears to be doing fairly well in regard to her ulcer on her foot. Fortunately there is no signs of severe infection at this time which is great news and overall very pleased in that regard. With that being said I do think that she could still benefit from a total contact cast. Nonetheless she is using her walking boot which at least provide some protection and that it prevents some of the friction occurring  when she is ambulating. 02/14/2020 upon evaluation today patient appears to be doing well with regard to her foot ulcer. This is actually measuring a little bit smaller yet again this week. Overall very pleased with where things stand and I do not see any signs of active infection at this time which is also good news. Since she is measuring better the patient has wanting to somewhat hold off on proceeding with the total contact cast which I think is reasonable at this point. 02/28/2020 on evaluation today patient appears to be doing well in general in regard to her wound although she has a lot of callus buildup as compared to last time I saw her. This is can require sharp debridement today. I do believe she really needs the total contact cast as well which we have discussed previous. 7/23; patient comes in for a total contact cast change 03/06/2020 on evaluation today patient appears to be doing quite well with regard to her wounds. Fortunately the wound bed is measuring smaller and looking much better there is little callus noted although there is some debridement necessary today. 03/13/2020 on evaluation today patient's wound actually appears to be doing excellent which is great news. With that being said unfortunately she is having some issues currently with her left leg where she does have cellulitis it appears. This may have come from an area that rubbed underneath the cast from last week that we noted we padded that area and it looks to be doing excellent at this point but nonetheless the leg was somewhat painful, swollen, and somewhat erythematous. She also had an elevated white blood cell count of 11.5 based on what I saw on looking at her records from the med  center in [facility] from where she was seen yesterday. Unfortunately with [place] having a provider on vacation there was no one here in the clinic in the afternoon when she called therefore she went to the ER as advised. Subsequently they did not  cut off the cast as they did not have anyone from orthopedics there to do so and subsequently also did not have the ability to do the Doppler for evaluation of DVT They recommended therefore given her dose of Eliquis as well as . Augmentin and sent her home to come see [place] today to have the cast taken off and then she is supposed to go back to have the study for DVT performed they are following. 03/20/2020 upon evaluation today patient appears to be doing well with regard to her foot all things considered we have not been able to use the total contact cast due to the infection that she had last week. She has been on the doxycycline and she had a 10-day supply of that I do believe that is helping and her leg appears to be doing better. With that being said there is fortunately no signs of active infection systemically at this time which is good news. No fevers, chills, nausea, vomiting, or diarrhea. 03/27/2020 upon evaluation today patient appears to be doing well with regard to her foot ulcer. There does not appear to be signs of active infection which is great news. Overall I am very pleased with where things stand at this point. 04/03/2020 upon evaluation today patient appears to be doing pretty well in regard to the overall appearance of her wound. Fortunately there is no signs of active infection at this time which is great news. No fevers, chills, nausea, vomiting, or diarrhea. With that being said she does have some blue-green drainage that actually is a little bit concerning to me for the possibility of Pseudomonas. I discussed that with the patient today. With that being said I do believe that we may be able to manage this however with the topical antibiotic cream as opposed to having to do anything oral especially since she seems to be doing so well with overall appearance of the wound. 04/10/2020 on evaluation today patient appears to be doing about the same roughly in regard to the overall size  of her wound. With that being said she fortunately has not shown any signs of worsening overall which is good news. I do believe that she is doing a great job trying to offload but again she may still do better with the cast. I do not see in the blue-green drainage that we noticed previously I do believe the gentamicin help in this regard. 04/17/2020 on evaluation today patient's wound appears to be doing about the same at this point. There is no significant improvement at this point. No fever chills noted. She is up for put the cast back on the day. That she states in a couple weeks she will need to have this off to go to a workshop. 04/24/2020 on evaluation today patient appears to be doing significantly better in regard to her wound. Fortunately there is no signs of active infection and overall feel like she is making great progress the cast seems to have done excellent for her. 05/01/2020 upon evaluation today patient presents for reevaluation she really does not appear to be doing too badly in regard to the actual wound on the left foot we have been managing. Unfortunately she has bilateral lower extremity edema with  blisters between the webspace of her first and second toe on both feet. She has a tremendous amount of edema in the legs which I think is where this is coming from it does not appear to be infected but nonetheless I do believe this is can be something that needs to be addressed today. Obviously this means we probably will not be putting the cast on at this point. She attributes this to the fact that she was sitting with her feet on the floor much longer during a conference last week she had a great time but unfortunately had a lot of complications as a result. 05/08/2020 upon evaluation today patient appears to be doing somewhat better in regard to her wounds at this time. Fortunately there is no signs of active infection which is great news. With that being said I do believe that the  blisters have ruptured and unfortunately did not just reattach I will remove some of the blistered tissue today. With that being said I do think the wound itself on the plantar aspect of left foot does need to have sharp debridement. 05/15/2020 upon evaluation today patient appears to be doing about the same in regard to her foot ulcer. Unfortunately in the past week her husband had a fall where he sustained a mild traumatic brain bleed. Fortunately he is doing better but being that he was in the hospital she had a walk on this a lot more. The wound does not appear to be any better is also not really appearing to be significantly worse which is good news. There is no signs of active infection at this time. 10/14; patient with a small diabetic wound on the medial part of her left foot. We have been using silver collagen a total contact cast making good progress. I think the patient had a series of blisters on her dorsal foot probably secondary to having her legs recumbent for 3 days while in a conference in [place]. We wrapped her leg last week these are all healed. We did not previously have her in compression on the right leg. 05/29/2020 upon evaluation today patient appears to be doing well with regard to the wound on the plantar aspect of her foot medially. This is measuring smaller and looking much better than last time I saw her. Again when I did see her last was 2 weeks back and the wound was significantly larger. I do believe the cast is helping and I believe the collagen is a good option for her. 06/05/2020 on evaluation today patient appears to be doing well with regard to her foot ulcer this is actually measuring significantly better and overall I feel like she is doing excellent. There is no signs of active infection at this time. 06/12/2020 upon evaluation today patient actually continues to show signs of good improvement which is excellent news. I am extremely pleased with how she seems to  be progressing at this point in regard to her wound. There is still some depth to the wound but I do believe the collagen is helping her quite a bit. 06/19/2020 upon evaluation today patient appears to be doing well with regard to her plantar foot ulcer. She is actually making excellent progress and in fact this appears to be almost completely healed. With that being said I do believe that the patient is going to actually require 1 more week in the cast although after that I am hopeful she will be ready for discharge. 06/26/2020 on evaluation today patient  appears to be doing well in regard to her wound currently. Fortunately there is no signs of active infection in general I feel like she is doing excellent. This appears to be completely healed I think she is ready to come out of the cast. 11/29; patient comes back in the clinic today with a very quick reopening in the exact same area on the medial plantar foot. She had been healed out last time. She went back into some new balance shoes that she got at [employer] with a custom insert. As it turns out this wound also happened when wearing these shoes although there was some modification made I think with the wound initially happened they added some foam around the wound area. This obviously is not going to be sufficient. 07/17/2020 on evaluation today patient appears to be doing well with regard to her wound. Fortunately she seems to be making good progress. Unfortunately she was in the hospital due to a issue with colitis and had just been discharged today in fact. She tells me that her biggest concern here is that a lot of her numbers especially her creatinine were somewhat elevated and problematic. She is can be following up with her provider in order to have a further work-up at this time. With that being said she did need to come back in for a cast change. She did not allow them to remove the cast due to the fact that she did not feel like that was  the issue whatsoever and indeed it does not appear that was the case her wound appears to be doing excellent today. 07/24/2020 upon evaluation today patient appears to be doing well in regard to her foot ulcer. She still has a small opening but this is showing signs of excellent improvement overall but they were very close to complete resolution. No fevers, chills, nausea, vomiting, or diarrhea. 07/31/2020 upon evaluation today patient actually appears to be doing excellent she is actually completely healed this is great news. Fortunately there is no signs of active infection at this time. No fevers, chills, nausea, vomiting, or diarrhea. The patient tells me that she did see Dr. Paulla Dolly in order to get to [place] so that she can have a custom shoe made. She saw him earlier last week. She does have an appointment with Liliane Channel on the sixth I believe on January she tells me Readmission: 08/28/2020 on evaluation today patient appears to be doing poorly in regard to her wound currently. She tells me this has reopened. The good news that she did get measured for and actually her shoes are on the way in from [facility].. With that being said she has been trying to stay off of this is much as possible using her wheelchair around home. Nonetheless has been somewhat difficult. The good news is her shoes should be here next week 09/04/2020 upon evaluation today patient appears to be doing well with regard to her wound. There is a little bit of callus buildup but nothing too significant she does tell me she was very active this week. Fortunately there is no signs of systemic infection at this point. The dorsal foot wound actually appears to be doing better. I think this is healed. 09/11/2020 upon evaluation today patient appears to actually be doing quite well in my opinion based on what I am seeing today. Fortunately there is no signs of infection in fact her mother is certain that this is even open any longer based  on what  I see. Fortunately I think the patient has been doing everything she can to try to keep this under control. 09/18/2020 upon evaluation today patient appears to be doing about the same in regard to her foot ulcer. She has been tolerating the dressing changes without complication. Fortunately there is no sign of active infection at this time. No fevers, chills, nausea, vomiting, or diarrhea. She is going require some sharp debridement today. 09/25/2020 upon evaluation today patient appears to be doing well with regard to his wounds she has been tolerating the dressing changes without complication. Her wound appears to be completely healed based on what I am seeing at this point. There does not appear to be any signs of active infection at this time which is great news. No fevers, chills, nausea, vomiting, or diarrhea. She did get the cushion for her foot as well that she ordered to try to help keep pressure off of this area. That looks like it may be very beneficial for her to be honest. 10/02/2020 upon evaluation today patient appears to be doing more poorly in regard to her foot ulcer. Unfortunately this has reopened since we saw her last week. It apparently did not take too long at all for this to happen. She is obviously somewhat disappointed as she was hopeful that that would be time this thing would stay closed. Fortunately there is no evidence of active infection at this point. No fevers, chills, nausea, vomiting, or diarrhea. With that being said I am good have to perform a little bit of sharp debridement clear away some of the debris currently. Including a minimal amount of callus at this time. 10/16/2020 upon evaluation today patient appears to be doing about the same in regard to her foot ulcer unfortunately. There does not appear to be any signs of active infection which is great news. No fever chills noted she has been tolerating the dressing changes without complication which is great  news. Unfortunately she tells me that she has been very depressed about the situation is getting very frustrating to her that she continues to have issues despite everything that she is trying to do to stay off of her foot. She is extremely discouraged and I do hate to hear this. Obviously we have been hopeful that if she got her shoes that would make a difference she is actually can be getting those tomorrow but I am not certain that that alone is good to be enough to get this to heal. We may end up having to consider going back into a total contact cast to get this to heal and then subsequently once we get her healed get her into her new diabetic shoes which will and my hope anyway keep this from reopening again. 10/23/2020 upon evaluation today patient appears to be doing a little worse both in regard to the size of her wound as well as in regard to the fact that she has erythema surrounding the wound and wrap around the lateral part of her foot where she tells me has been somewhat sore. Fortunately there does not appear to be any evidence of infection systemically but locally definitely there is erythema and warmth consistent with local cellulitis. I am glad to remove some of the callus as well. 10/30/2020 upon evaluation today patient appears to be doing well with regard to her wound on the plantar aspect of her foot. This is significantly improved compared to last week. 10/30/2020 upon evaluation today patient actually is making excellent progress  her wound appears to show signs of great improvement which is wonderful and that she is extremely pleased to hear this. She actually leaves Sunday to go on her trip with her half sister and friends. 11/13/2020 on evaluation today patient's wound actually appears to be doing quite well. There does not appear to be any signs of infection it has been 2 weeks since have seen her she does have a little bit of callus buildup here today but at the same time I do  not believe the vacation time set her back any just has not really made a lot of significant improvement. She is done with her antibiotics at this point. 11/20/2020 upon evaluation today patient appears to be doing poorly in regard to her foot. In fact this appears to be showing signs of Infection. She has erythema and warmth that is concerning. I know she is very discouraged as this seems to be a recurrent issue. I think we may need to delve further into the possibility of something deeper going on here as far as a structural infection. Electronic Signature(s) Signed: 11/20/2020 1:09:42 PM By: Worthy Keeler PA-C Entered By: Worthy Keeler on 11/20/2020 13:09:42 -------------------------------------------------------------------------------- Physical Exam Details Patient Name: Date of Service: [name] [name], [name] RA E. 11/20/2020 11:00 A M Medical Record Number: [id number] Patient Account Number: [id number] Date of Birth/Sex: Treating RN: Feb 09, 1946 (75 y.o. [name] Other Clinician: Referring Provider: Treating Provider/Extender: Adele Schilder in Treatment: 7 Constitutional Well-nourished and well-hydrated in no acute distress. Respiratory normal breathing without difficulty. Psychiatric this patient is able to make decisions and demonstrates good insight into disease process. Alert and Oriented x 3. pleasant and cooperative. Notes Upon inspection patient appears to be doing poorly in regard to her wound. This is showing signs of local cellulitis which does have me concerned. Fortunately there does not appear to be any evidence of systemic infection. I did perform debridement to clear away some of the necrotic debris and callus including an area that was blistered to the medial aspect. Post debridement the wound appears to be doing better I did take a culture from the deepest part of the wound post debridement. Electronic  Signature(s) Signed: 11/20/2020 1:26:45 PM By: Worthy Keeler PA-C Entered By: Worthy Keeler on 11/20/2020 13:26:45 -------------------------------------------------------------------------------- Physician Orders Details Patient Name: Date of Service: [name] [name], [name] RA E. 11/20/2020 11:00 A M Medical Record Number: [id number] Patient Account Number: [id number] Date of Birth/Sex: Treating RN: [birth year] (75 y.o. [name] Other Clinician: Referring Provider: Treating Provider/Extender: Adele Schilder in Treatment: 39 Verbal / Phone Orders: No Diagnosis Coding ICD-10 Coding Code Description E11.621 Type 2 diabetes mellitus with foot ulcer L97.522 Non-pressure chronic ulcer of other part of left foot with fat layer exposed M14.672 Charcot's joint, left ankle and foot E11.40 Type 2 diabetes mellitus with diabetic neuropathy, unspecified Follow-up Appointments Return Appointment in 1 week. Bathing/ Shower/ Hygiene May shower with protection but do not get wound dressing(s) wet. Off-Loading Open toe surgical shoe to: - left foot Additional Orders / Instructions Follow Nutritious Diet Wound Treatment Wound #[id number] - Foot Wound Laterality: Plantar, Left, Medial Cleanser: Wound Cleanser (Generic) Every Other Day/30 Days Discharge Instructions: Cleanse the wound with wound cleanser prior to applying a clean dressing using gauze sponges, not tissue or cotton balls. Prim Dressing: KerraCel Ag Gelling Fiber Dressing, 2x2 in (silver alginate) (Generic) Every Other  Day/30 Days ary Discharge Instructions: Apply silver alginate to wound bed as instructed Secondary Dressing: Woven Gauze Sponges 2x2 in Every Other Day/30 Days Discharge Instructions: Apply over primary dressing as directed. Secondary Dressing: Optifoam Non-Adhesive Dressing, 4x4 in (Generic) Every Other Day/30 Days Discharge Instructions: Apply over primary dressing  cut to make foam donut Secured With: Kerlix Roll Sterile, 4.5x3.1 (in/yd) (Generic) Every Other Day/30 Days Discharge Instructions: Secure with Kerlix as directed. Secured With: 40M Medipore H Soft Cloth Surgical T 4 x 2 (in/yd) (Generic) Every Other Day/30 Days ape Discharge Instructions: Secure dressing with tape as directed. Laboratory naerobe culture (MICRO) - left foot Bacteria identified in Unspecified specimen by A LOINC Code: 161-0 Convenience Name: Anerobic culture Radiology X-ray, foot left complete view - diabetic foot ulcer left plantar foot CPT 73630 - (ICD10 L97.522 - Non-pressure chronic ulcer of other part of left foot with fat layer exposed) Patient Medications llergies: bee venom protein (honey bee), Sulfa (Sulfonamide Antibiotics), Farxiga A Notifications Medication Indication Start End 11/20/2020 doxycycline hyclate DOSE 1 - oral 100 mg capsule - 1 capsule oral taken 2 times per day for 14 days 11/27/2020 Augmentin DOSE 1 - oral 875 mg-125 mg tablet - 1 tablet oral taken 2 times per day for 30 days Electronic Signature(s) Signed: 11/27/2020 11:00:58 AM By: Worthy Keeler PA-C Previous Signature: 11/20/2020 12:19:36 PM Version By: Worthy Keeler PA-C Entered By: Worthy Keeler on 11/27/2020 11:00:55 Prescription 11/20/2020 -------------------------------------------------------------------------------- Darral Dash PA Patient Name: Provider: 01-14-1946 [id number] Date of Birth: NPI#[id number] Primus Sex: DEA #: [phone] Phone #: License #: [place] Patient Address: [address], [place] [address] [place], [place] [address] [phone] Allergies bee venom protein (honey bee); Sulfa (Sulfonamide Antibiotics); Farxiga Provider's Orders X-ray, foot left complete view - ICD10: L24.401 - diabetic foot ulcer left plantar foot CPT 73630 Hand  Signature: Date(s): Electronic Signature(s) Unsigned Previous Signature: 11/20/2020 5:03:59 PM Version By: Worthy Keeler PA-C Entered By: Worthy Keeler on 11/27/2020 11:00:58 -------------------------------------------------------------------------------- Problem List Details Patient Name: Date of Service: [name] [name], [name] RA E. 11/20/2020 11:00 A M Medical Record Number: [id number] Patient Account Number: [id number] Date of Birth/Sex: Treating RN: Aug 10, 1946 (75 y.o. [name] Other Clinician: Referring Provider: Treating Provider/Extender: Adele Schilder in Treatment: 22 Active Problems ICD-10 Encounter Code Description Active Date MDM Diagnosis E11.621 Type 2 diabetes mellitus with foot ulcer 01/17/2020 No Yes L97.522 Non-pressure chronic ulcer of other part of left foot with fat layer exposed 01/17/2020 No Yes M14.672 Charcot's joint, left ankle and foot 01/17/2020 No Yes E11.40 Type 2 diabetes mellitus with diabetic neuropathy, unspecified 01/17/2020 No Yes Inactive Problems ICD-10 Code Description Active Date Inactive Date I10 Essential (primary) hypertension 01/17/2020 01/17/2020 Resolved Problems Electronic Signature(s) Signed: 11/20/2020 11:28:57 AM By: Worthy Keeler PA-C Entered By: Worthy Keeler on 11/20/2020 11:28:57 -------------------------------------------------------------------------------- Progress Note Details Patient Name: Date of Service: [name] [name], [name] RA E. 11/20/2020 11:00 A M Medical Record Number: [id number] Patient Account Number: [id number] Date of Birth/Sex: Treating RN: [birth year] (75 y.o. [name] Other Clinician: Referring Provider: Treating Provider/Extender: Adele Schilder in Treatment: 57 Subjective Chief Complaint Information obtained from Patient Left foot ulcer History of Present Illness (HPI) 01/17/2020 upon  evaluation today patient presents for initial evaluation here in our clinic concerning issues  she has been having with a left medial/plantar foot ulcer. This is actually been an issue for her since October 2020. She has been seeing Dr. Doran Durand for quite some time during that course. Fortunately there is no signs of active infection at this time. Or least no mention of this to have seen in general. With that being said unfortunately I do see some signs of erythema noted today that does have me concerned about the possibility of infection at this point in the surrounding area of the wound. There is also a warm to touch at the site which is somewhat concerning. Fortunately there is no evidence of systemic infection which is great news. The patient does have a history of diabetes mellitus type 2, Charcot foot which is what led to the wound, and hypertension. She notes that she was in a cast for some time with Dr. Doran Durand for about 8 weeks. During that time they were utilizing according to the patient silver nitrate along with a foam doughnut and then Coban to secure in place in the cast in place. With that being said I do not have the actual records to review we are going to try to get a hold of those unfortunately they would not flow over into care everywhere I did look today. She has been seeing Dr. Doran Durand and his physician assistant Larkin Ina up until the end of May and apparently is still seeing them on a regular basis every 2 weeks roughly. She has also tried Iodosorb without effect here. 01/24/2020 upon evaluation today patient actually appears to be doing quite well with regard to her wounds. She has been tolerating the dressing changes without complication. Fortunately there is no signs of active infection spreading which is good news. Her culture did show signs of Staph aureus I did place her on Augmentin due to the erythema surrounding the wound. With that being said the wound does appear to be doing  better she has her longer walking cast/boot and I think that is actually good for her for the time being. I am considering reinitiating total contact cast when she gets back from vacation but next week she will actually be out of town at [employer] she knows not to get in the water but she still obviously is planning to enjoy herself she is going to take it easy on her foot however. 02/07/2020 upon evaluation today patient appears to be doing fairly well in regard to her ulcer on her foot. Fortunately there is no signs of severe infection at this time which is great news and overall very pleased in that regard. With that being said I do think that she could still benefit from a total contact cast. Nonetheless she is using her walking boot which at least provide some protection and that it prevents some of the friction occurring when she is ambulating. 02/14/2020 upon evaluation today patient appears to be doing well with regard to her foot ulcer. This is actually measuring a little bit smaller yet again this week. Overall very pleased with where things stand and I do not see any signs of active infection at this time which is also good news. Since she is measuring better the patient has wanting to somewhat hold off on proceeding with the total contact cast which I think is reasonable at this point. 02/28/2020 on evaluation today patient appears to be doing well in general in regard to her wound although she has a lot of callus buildup as compared to  last time I saw her. This is can require sharp debridement today. I do believe she really needs the total contact cast as well which we have discussed previous. 7/23; patient comes in for a total contact cast change 03/06/2020 on evaluation today patient appears to be doing quite well with regard to her wounds. Fortunately the wound bed is measuring smaller and looking much better there is little callus noted although there is some debridement necessary  today. 03/13/2020 on evaluation today patient's wound actually appears to be doing excellent which is great news. With that being said unfortunately she is having some issues currently with her left leg where she does have cellulitis it appears. This may have come from an area that rubbed underneath the cast from last week that we noted we padded that area and it looks to be doing excellent at this point but nonetheless the leg was somewhat painful, swollen, and somewhat erythematous. She also had an elevated white blood cell count of 11.5 based on what I saw on looking at her records from the med center in [place] from where she was seen yesterday. Unfortunately with [place] having a provider on vacation there was no one here in the clinic in the afternoon when she called therefore she went to the ER as advised. Subsequently they did not cut off the cast as they did not have anyone from orthopedics there to do so and subsequently also did not have the ability to do the Doppler for evaluation of DVT They recommended therefore given her dose of Eliquis as well as . Augmentin and sent her home to come see [place] today to have the cast taken off and then she is supposed to go back to have the study for DVT performed they are following. 03/20/2020 upon evaluation today patient appears to be doing well with regard to her foot all things considered we have not been able to use the total contact cast due to the infection that she had last week. She has been on the doxycycline and she had a 10-day supply of that I do believe that is helping and her leg appears to be doing better. With that being said there is fortunately no signs of active infection systemically at this time which is good news. No fevers, chills, nausea, vomiting, or diarrhea. 03/27/2020 upon evaluation today patient appears to be doing well with regard to her foot ulcer. There does not appear to be signs of active infection which is great news.  Overall I am very pleased with where things stand at this point. 04/03/2020 upon evaluation today patient appears to be doing pretty well in regard to the overall appearance of her wound. Fortunately there is no signs of active infection at this time which is great news. No fevers, chills, nausea, vomiting, or diarrhea. With that being said she does have some blue-green drainage that actually is a little bit concerning to me for the possibility of Pseudomonas. I discussed that with the patient today. With that being said I do believe that we may be able to manage this however with the topical antibiotic cream as opposed to having to do anything oral especially since she seems to be doing so well with overall appearance of the wound. 04/10/2020 on evaluation today patient appears to be doing about the same roughly in regard to the overall size of her wound. With that being said she fortunately has not shown any signs of worsening overall which is good news. I  do believe that she is doing a great job trying to offload but again she may still do better with the cast. I do not see in the blue-green drainage that we noticed previously I do believe the gentamicin help in this regard. 04/17/2020 on evaluation today patient's wound appears to be doing about the same at this point. There is no significant improvement at this point. No fever chills noted. She is up for put the cast back on the day. That she states in a couple weeks she will need to have this off to go to a workshop. 04/24/2020 on evaluation today patient appears to be doing significantly better in regard to her wound. Fortunately there is no signs of active infection and overall feel like she is making great progress the cast seems to have done excellent for her. 05/01/2020 upon evaluation today patient presents for reevaluation she really does not appear to be doing too badly in regard to the actual wound on the left foot we have been managing.  Unfortunately she has bilateral lower extremity edema with blisters between the webspace of her first and second toe on both feet. She has a tremendous amount of edema in the legs which I think is where this is coming from it does not appear to be infected but nonetheless I do believe this is can be something that needs to be addressed today. Obviously this means we probably will not be putting the cast on at this point. She attributes this to the fact that she was sitting with her feet on the floor much longer during a conference last week she had a great time but unfortunately had a lot of complications as a result. 05/08/2020 upon evaluation today patient appears to be doing somewhat better in regard to her wounds at this time. Fortunately there is no signs of active infection which is great news. With that being said I do believe that the blisters have ruptured and unfortunately did not just reattach I will remove some of the blistered tissue today. With that being said I do think the wound itself on the plantar aspect of left foot does need to have sharp debridement. 05/15/2020 upon evaluation today patient appears to be doing about the same in regard to her foot ulcer. Unfortunately in the past week her husband had a fall where he sustained a mild traumatic brain bleed. Fortunately he is doing better but being that he was in the hospital she had a walk on this a lot more. The wound does not appear to be any better is also not really appearing to be significantly worse which is good news. There is no signs of active infection at this time. 10/14; patient with a small diabetic wound on the medial part of her left foot. We have been using silver collagen a total contact cast making good progress. I think the patient had a series of blisters on her dorsal foot probably secondary to having her legs recumbent for 3 days while in a conference in [place]. We wrapped her leg last week these are all healed.  We did not previously have her in compression on the right leg. 05/29/2020 upon evaluation today patient appears to be doing well with regard to the wound on the plantar aspect of her foot medially. This is measuring smaller and looking much better than last time I saw her. Again when I did see her last was 2 weeks back and the wound was significantly larger. I do believe  the cast is helping and I believe the collagen is a good option for her. 06/05/2020 on evaluation today patient appears to be doing well with regard to her foot ulcer this is actually measuring significantly better and overall I feel like she is doing excellent. There is no signs of active infection at this time. 06/12/2020 upon evaluation today patient actually continues to show signs of good improvement which is excellent news. I am extremely pleased with how she seems to be progressing at this point in regard to her wound. There is still some depth to the wound but I do believe the collagen is helping her quite a bit. 06/19/2020 upon evaluation today patient appears to be doing well with regard to her plantar foot ulcer. She is actually making excellent progress and in fact this appears to be almost completely healed. With that being said I do believe that the patient is going to actually require 1 more week in the cast although after that I am hopeful she will be ready for discharge. 06/26/2020 on evaluation today patient appears to be doing well in regard to her wound currently. Fortunately there is no signs of active infection in general I feel like she is doing excellent. This appears to be completely healed I think she is ready to come out of the cast. 11/29; patient comes back in the clinic today with a very quick reopening in the exact same area on the medial plantar foot. She had been healed out last time. She went back into some new balance shoes that she got at [employer] with a custom insert. As it turns out this wound also  happened when wearing these shoes although there was some modification made I think with the wound initially happened they added some foam around the wound area. This obviously is not going to be sufficient. 07/17/2020 on evaluation today patient appears to be doing well with regard to her wound. Fortunately she seems to be making good progress. Unfortunately she was in the hospital due to a issue with colitis and had just been discharged today in fact. She tells me that her biggest concern here is that a lot of her numbers especially her creatinine were somewhat elevated and problematic. She is can be following up with her provider in order to have a further work-up at this time. With that being said she did need to come back in for a cast change. She did not allow them to remove the cast due to the fact that she did not feel like that was the issue whatsoever and indeed it does not appear that was the case her wound appears to be doing excellent today. 07/24/2020 upon evaluation today patient appears to be doing well in regard to her foot ulcer. She still has a small opening but this is showing signs of excellent improvement overall but they were very close to complete resolution. No fevers, chills, nausea, vomiting, or diarrhea. 07/31/2020 upon evaluation today patient actually appears to be doing excellent she is actually completely healed this is great news. Fortunately there is no signs of active infection at this time. No fevers, chills, nausea, vomiting, or diarrhea. The patient tells me that she did see Dr. Paulla Dolly in order to get to [place] so that she can have a custom shoe made. She saw him earlier last week. She does have an appointment with Liliane Channel on the sixth I believe on January she tells me Readmission: 08/28/2020 on evaluation today patient appears to be  doing poorly in regard to her wound currently. She tells me this has reopened. The good news that she did get measured for and actually her  shoes are on the way in from [facility].. With that being said she has been trying to stay off of this is much as possible using her wheelchair around home. Nonetheless has been somewhat difficult. The good news is her shoes should be here next week 09/04/2020 upon evaluation today patient appears to be doing well with regard to her wound. There is a little bit of callus buildup but nothing too significant she does tell me she was very active this week. Fortunately there is no signs of systemic infection at this point. The dorsal foot wound actually appears to be doing better. I think this is healed. 09/11/2020 upon evaluation today patient appears to actually be doing quite well in my opinion based on what I am seeing today. Fortunately there is no signs of infection in fact her mother is certain that this is even open any longer based on what I see. Fortunately I think the patient has been doing everything she can to try to keep this under control. 09/18/2020 upon evaluation today patient appears to be doing about the same in regard to her foot ulcer. She has been tolerating the dressing changes without complication. Fortunately there is no sign of active infection at this time. No fevers, chills, nausea, vomiting, or diarrhea. She is going require some sharp debridement today. 09/25/2020 upon evaluation today patient appears to be doing well with regard to his wounds she has been tolerating the dressing changes without complication. Her wound appears to be completely healed based on what I am seeing at this point. There does not appear to be any signs of active infection at this time which is great news. No fevers, chills, nausea, vomiting, or diarrhea. She did get the cushion for her foot as well that she ordered to try to help keep pressure off of this area. That looks like it may be very beneficial for her to be honest. 10/02/2020 upon evaluation today patient appears to be doing more poorly in  regard to her foot ulcer. Unfortunately this has reopened since we saw her last week. It apparently did not take too long at all for this to happen. She is obviously somewhat disappointed as she was hopeful that that would be time this thing would stay closed. Fortunately there is no evidence of active infection at this point. No fevers, chills, nausea, vomiting, or diarrhea. With that being said I am good have to perform a little bit of sharp debridement clear away some of the debris currently. Including a minimal amount of callus at this time. 10/16/2020 upon evaluation today patient appears to be doing about the same in regard to her foot ulcer unfortunately. There does not appear to be any signs of active infection which is great news. No fever chills noted she has been tolerating the dressing changes without complication which is great news. Unfortunately she tells me that she has been very depressed about the situation is getting very frustrating to her that she continues to have issues despite everything that she is trying to do to stay off of her foot. She is extremely discouraged and I do hate to hear this. Obviously we have been hopeful that if she got her shoes that would make a difference she is actually can be getting those tomorrow but I am not certain that that alone  is good to be enough to get this to heal. We may end up having to consider going back into a total contact cast to get this to heal and then subsequently once we get her healed get her into her new diabetic shoes which will and my hope anyway keep this from reopening again. 10/23/2020 upon evaluation today patient appears to be doing a little worse both in regard to the size of her wound as well as in regard to the fact that she has erythema surrounding the wound and wrap around the lateral part of her foot where she tells me has been somewhat sore. Fortunately there does not appear to be any evidence of infection systemically  but locally definitely there is erythema and warmth consistent with local cellulitis. I am glad to remove some of the callus as well. 10/30/2020 upon evaluation today patient appears to be doing well with regard to her wound on the plantar aspect of her foot. This is significantly improved compared to last week. 10/30/2020 upon evaluation today patient actually is making excellent progress her wound appears to show signs of great improvement which is wonderful and that she is extremely pleased to hear this. She actually leaves Sunday to go on her trip with her half sister and friends. 11/13/2020 on evaluation today patient's wound actually appears to be doing quite well. There does not appear to be any signs of infection it has been 2 weeks since have seen her she does have a little bit of callus buildup here today but at the same time I do not believe the vacation time set her back any just has not really made a lot of significant improvement. She is done with her antibiotics at this point. 11/20/2020 upon evaluation today patient appears to be doing poorly in regard to her foot. In fact this appears to be showing signs of Infection. She has erythema and warmth that is concerning. I know she is very discouraged as this seems to be a recurrent issue. I think we may need to delve further into the possibility of something deeper going on here as far as a structural infection. Objective Constitutional Well-nourished and well-hydrated in no acute distress. Vitals Time Taken: 11:09 AM, Height: 66 in, Weight: 245 lbs, BMI: 39.5, Temperature: 98.1 F, Pulse: 88 bpm, Respiratory Rate: 18 breaths/min, Blood Pressure: 144/78 mmHg. Respiratory normal breathing without difficulty. Psychiatric this patient is able to make decisions and demonstrates good insight into disease process. Alert and Oriented x 3. pleasant and cooperative. General Notes: Upon inspection patient appears to be doing poorly in regard to  her wound. This is showing signs of local cellulitis which does have me concerned. Fortunately there does not appear to be any evidence of systemic infection. I did perform debridement to clear away some of the necrotic debris and callus including an area that was blistered to the medial aspect. Post debridement the wound appears to be doing better I did take a culture from the deepest part of the wound post debridement. Integumentary (Hair, Skin) Wound #1RRR status is Open. Original cause of wound was Gradually Appeared. The date acquired was: 05/11/2019. The wound has been in treatment 44 weeks. The wound is located on the [place]. The wound measures 0.3cm length x 0.3cm width x 0.2cm depth; 0.071cm^2 area and 0.014cm^3 volume. There is Fat Layer (Subcutaneous Tissue) exposed. There is no tunneling or undermining noted. There is a medium amount of serosanguineous drainage noted. The wound margin is thickened. There  is large (67-100%) pink granulation within the wound bed. There is no necrotic tissue within the wound bed. Assessment Active Problems ICD-10 Type 2 diabetes mellitus with foot ulcer Non-pressure chronic ulcer of other part of left foot with fat layer exposed Charcot's joint, left ankle and foot Type 2 diabetes mellitus with diabetic neuropathy, unspecified Procedures Wound #1RRR Pre-procedure diagnosis of Wound #1RRR is a Diabetic Wound/Ulcer of the Lower Extremity located on the Left,Medial,Plantar Foot .Severity of Tissue Pre Debridement is: Fat layer exposed. There was a Excisional Skin/Subcutaneous Tissue Debridement with a total area of 8.75 sq cm performed by Worthy Keeler, PA. With the following instrument(s): Curette to remove Viable and Non-Viable tissue/material. Material removed includes Callus, Subcutaneous Tissue, Slough, and Skin: Epidermis after achieving pain control using Other (benzocaine 20% spray). 1 specimen was taken by a Swab and sent to the  lab per facility protocol. A time out was conducted at 12:00, prior to the start of the procedure. A Minimum amount of bleeding was controlled with Pressure. The procedure was tolerated well with a pain level of 0 throughout and a pain level of 0 following the procedure. Post Debridement Measurements: 1.5cm length x 1.8cm width x 0.2cm depth; 0.424cm^3 volume. Character of Wound/Ulcer Post Debridement is improved. Severity of Tissue Post Debridement is: Fat layer exposed. Post procedure Diagnosis Wound #1RRR: Same as Pre-Procedure Plan Follow-up Appointments: Return Appointment in 1 week. Bathing/ Shower/ Hygiene: May shower with protection but do not get wound dressing(s) wet. Off-Loading: Open toe surgical shoe to: - left foot Additional Orders / Instructions: Follow Nutritious Diet Laboratory ordered were: Anerobic culture - left foot Radiology ordered were: X-ray, foot left complete view - diabetic foot ulcer left plantar foot CPT 73630 The following medication(s) was prescribed: doxycycline hyclate oral 100 mg capsule 1 1 capsule oral taken 2 times per day for 14 days starting 11/20/2020 WOUND #1RRR: - Foot Wound Laterality: Plantar, Left, Medial Cleanser: Wound Cleanser (Generic) Every Other Day/30 Days Discharge Instructions: Cleanse the wound with wound cleanser prior to applying a clean dressing using gauze sponges, not tissue or cotton balls. Prim Dressing: KerraCel Ag Gelling Fiber Dressing, 2x2 in (silver alginate) (Generic) Every Other Day/30 Days ary Discharge Instructions: Apply silver alginate to wound bed as instructed Secondary Dressing: Woven Gauze Sponges 2x2 in Every Other Day/30 Days Discharge Instructions: Apply over primary dressing as directed. Secondary Dressing: Optifoam Non-Adhesive Dressing, 4x4 in (Generic) Every Other Day/30 Days Discharge Instructions: Apply over primary dressing cut to make foam donut Secured With: Kerlix Roll Sterile, 4.5x3.1 (in/yd)  (Generic) Every Other Day/30 Days Discharge Instructions: Secure with Kerlix as directed. Secured With: 39M Medipore H Soft Cloth Surgical T 4 x 2 (in/yd) (Generic) Every Other Day/30 Days ape Discharge Instructions: Secure dressing with tape as directed. 1. Would recommend him going to start the patient on doxycycline I think the sooner we do that the better. Obviously if any make any adjustments based on the culture results I will do so when the time comes. 2. I am also can recommend at this time that we have the patient go ahead and continue with the foam and rolled gauze dressing to cover. I think that still the best way to go. We will see patient back for reevaluation in 1 week here in the clinic. If anything worsens or changes patient will contact our office for additional recommendations. Electronic Signature(s) Signed: 11/20/2020 1:27:17 PM By: Worthy Keeler PA-C Entered By: Worthy Keeler on 11/20/2020 13:27:17 -------------------------------------------------------------------------------- SuperBill  Details Patient Name: Date of Service: [name] [name], [name] E. 11/20/2020 Medical Record Number: [id number] Patient Account Number: [id number] Date of Birth/Sex: Treating RN: [birth year] (75 y.o. [name] Other Clinician: Referring Provider: Treating Provider/Extender: Adele Schilder in Treatment: 44 Diagnosis Coding ICD-10 Codes Code Description E11.621 Type 2 diabetes mellitus with foot ulcer L97.522 Non-pressure chronic ulcer of other part of left foot with fat layer exposed M14.672 Charcot's joint, left ankle and foot E11.40 Type 2 diabetes mellitus with diabetic neuropathy, unspecified Facility Procedures CPT4 Code: [id number] Description: 94320 - DEB SUBQ TISSUE 20 SQ CM/< ICD-10 Diagnosis Description L97.522 Non-pressure chronic ulcer of other part of left foot with fat layer exposed Modifier: Quantity:  1 Physician Procedures : CPT4 Code Description Modifier [id number] 99214 - WC PHYS LEVEL 4 - EST PT 25 ICD-10 Diagnosis Description E11.621 Type 2 diabetes mellitus with foot ulcer L97.522 Non-pressure chronic ulcer of other part of left foot with fat layer exposed M14.672  Charcot's joint, left ankle and foot E11.40 Type 2 diabetes mellitus with diabetic neuropathy, unspecified Quantity: 1 : [id number] 11042 - WC PHYS SUBQ TISS 20 SQ CM ICD-10 Diagnosis Description L97.522 Non-pressure chronic ulcer of other part of left foot with fat layer exposed Quantity: 1 Electronic Signature(s) Signed: 11/20/2020 1:28:06 PM By: Worthy Keeler PA-C Entered By: Worthy Keeler on 11/20/2020 13:28:06

## 2020-11-20 NOTE — Progress Notes (Signed)
MIGUEL, MEDAL ([id number]) Visit Report for 11/20/2020 Arrival Information Details Patient Name: Date of Service: TAKEESHA, ISLEY RA E. 11/20/2020 11:00 A M Medical Record Number: [id number] Patient Account Number: [id number] Date of Birth/Sex: Treating RN: Mar 10, 1946 (75 y.o. [name], [name] Primary Care Gawain Crombie: [name] Other Clinician: Referring Leitha Hyppolite: Treating Jandy Brackens/Extender: Adele Schilder in Treatment: 58 Visit Information History Since Last Visit Added or deleted any medications: No Patient Arrived: Wheel Chair Any new allergies or adverse reactions: No Arrival Time: 11:07 Had a fall or experienced change in No Accompanied By: self activities of daily living that may affect Transfer Assistance: None risk of falls: Patient Identification Verified: Yes Signs or symptoms of abuse/neglect since last visito No Secondary Verification Process Completed: Yes Hospitalized since last visit: No Patient Requires Transmission-Based Precautions: No Implantable device outside of the clinic excluding No Patient Has Alerts: No cellular tissue based products placed in the center since last visit: Has Dressing in Place as Prescribed: Yes Pain Present Now: No Electronic Signature(s) Signed: 11/20/2020 4:36:07 PM By: Sandre Kitty Entered By: Sandre Kitty on 11/20/2020 11:09:10 -------------------------------------------------------------------------------- Encounter Discharge Information Details Patient Name: Date of Service: [name] 11/20/2020 11:00 A M Medical Record Number: [id number] Patient Account Number: [id number] Date of Birth/Sex: Treating RN: [birth year] (74 y.o. [name] Primary Care Gali Spinney: [name] Other Clinician: Referring Elizah Mierzwa: Treating Selden Noteboom/Extender: Adele Schilder in Treatment: 63 Encounter Discharge Information Items Post Procedure Vitals Discharge Condition:  Stable Temperature (F): 98.1 Ambulatory Status: Wheelchair Pulse (bpm): 88 Discharge Destination: Home Respiratory Rate (breaths/min): 18 Transportation: Private Auto Blood Pressure (mmHg): 144/78 Accompanied By: self Schedule Follow-up Appointment: Yes Clinical Summary of Care: Electronic Signature(s) Signed: 11/20/2020 12:26:47 PM By: Deon Pilling Entered By: Deon Pilling on 11/20/2020 12:23:32 -------------------------------------------------------------------------------- Lower Extremity Assessment Details Patient Name: Date of Service: NECIE, WILCOXSON RA E. 11/20/2020 11:00 A M Medical Record Number: [id number] Patient Account Number: [id number] Date of Birth/Sex: Treating RN: 13-Aug-1945 (75 y.o. [name] Primary Care Arcenia Scarbro: [name] Other Clinician: Referring Ashna Dorough: Treating Mollyann Halbert/Extender: Landis Martins Weeks in Treatment: 44 Edema Assessment Assessed: [Left: No] [Right: No] Edema: [Left: Ye] [Right: s] Calf Left: Right: Point of Measurement: 29 cm From Medial Instep 34 cm Ankle Left: Right: Point of Measurement: 12 cm From Medial Instep 21 cm Vascular Assessment Pulses: Dorsalis Pedis Palpable: [Left:Yes] Electronic Signature(s) Signed: 11/20/2020 5:13:29 PM By: Levan Hurst RN, BSN Entered By: Levan Hurst on 11/20/2020 11:21:16 -------------------------------------------------------------------------------- Multi-Disciplinary Care Plan Details Patient Name: Date of Service: [name] 11/20/2020 11:00 A M Medical Record Number: [id number] Patient Account Number: [id number] Date of Birth/Sex: Treating RN: [birth year] (75 y.o. [name] Primary Care Fount Bahe: [name] Other Clinician: Referring Evelise Reine: Treating Gloyd Happ/Extender: Adele Schilder in Treatment: [place] reviewed with physician Active Inactive Nutrition Nursing Diagnoses: Impaired glucose  control: actual or potential Potential for alteratiion in Nutrition/Potential for imbalanced nutrition Goals: Patient/caregiver verbalizes understanding of need to maintain therapeutic glucose control per primary care physician Date Initiated: 01/17/2020 Date Inactivated: 02/14/2020 Target Resolution Date: 02/14/2020 Goal Status: Met Patient/caregiver will maintain therapeutic glucose control Date Initiated: 01/17/2020 Target Resolution Date: 12/18/2020 Goal Status: Active Interventions: Assess HgA1c results as ordered upon admission and as needed Assess patient nutrition upon admission and as needed per policy Provide education on elevated blood sugars and impact on wound healing Treatment Activities: Patient referred to Primary Care Physician  for further nutritional evaluation : 01/17/2020 Notes: Wound/Skin Impairment Nursing Diagnoses: Impaired tissue integrity Knowledge deficit related to ulceration/compromised skin integrity Goals: Patient/caregiver will verbalize understanding of skin care regimen Date Initiated: 01/17/2020 Target Resolution Date: 12/18/2020 Goal Status: Active Ulcer/skin breakdown will have a volume reduction of 30% by week 4 Date Initiated: 01/17/2020 Date Inactivated: 02/14/2020 Target Resolution Date: 02/14/2020 Goal Status: Met Ulcer/skin breakdown will have a volume reduction of 50% by week 8 Date Initiated: 02/14/2020 Date Inactivated: 03/13/2020 Target Resolution Date: 03/13/2020 Goal Status: Met Ulcer/skin breakdown will have a volume reduction of 80% by week 12 Date Initiated: 03/13/2020 Date Inactivated: 04/10/2020 Target Resolution Date: 04/10/2020 Goal Status: Unmet Unmet Reason: infection Interventions: Assess patient/caregiver ability to obtain necessary supplies Assess patient/caregiver ability to perform ulcer/skin care regimen upon admission and as needed Assess ulceration(s) every visit Provide education on ulcer and skin care Treatment Activities: Skin  care regimen initiated : 01/17/2020 Topical wound management initiated : 01/17/2020 Notes: Electronic Signature(s) Signed: 11/20/2020 5:38:32 PM By: [name] RN, BSN Entered By: [name] on 11/20/2020 12:02:18 -------------------------------------------------------------------------------- Pain Assessment Details Patient Name: Date of Service: [name] 11/20/2020 11:00 A M Medical Record Number: [id number] Patient Account Number: [id number] Date of Birth/Sex: Treating RN: 1945/09/05 (75 y.o. [name] Primary Care : [name] Other Clinician: Referring : Treating /Extender: Adele Schilder in Treatment: 72 Active Problems Location of Pain Severity and Description of Pain Patient Has Paino No Site Locations Pain Management and Medication Current Pain Management: Electronic Signature(s) Signed: 11/20/2020 4:36:07 PM By: Sandre Kitty Signed: 11/20/2020 5:38:32 PM By: [name] RN, BSN Entered By: Sandre Kitty on 11/20/2020 11:09:33 -------------------------------------------------------------------------------- Patient/Caregiver Education Details Patient Name: Date of Service: [name] 4/13/2022andnbsp11:00 A M Medical Record Number: [id number] Patient Account Number: [id number] Date of Birth/Gender: Treating RN: 12-Sep-1945 (75 y.o. [name] Primary Care Physician: [name] Other Clinician: Referring Physician: Treating Physician/Extender: Adele Schilder in Treatment: 22 Education Assessment Education Provided To: Patient Education Topics Provided Elevated Blood Sugar/ Impact on Healing: Methods: Explain/Verbal Responses: Reinforcements needed, State content correctly Offloading: Methods: Explain/Verbal Responses: Reinforcements needed, State content correctly Wound/Skin Impairment: Methods: Explain/Verbal Responses: Reinforcements needed,  State content correctly Electronic Signature(s) Signed: 11/20/2020 5:38:32 PM By: [name] RN, BSN Entered By: [name] on 11/20/2020 12:03:30 -------------------------------------------------------------------------------- Wound Assessment Details Patient Name: Date of Service: [name] 11/20/2020 11:00 A M Medical Record Number: [id number] Patient Account Number: [id number] Date of Birth/Sex: Treating RN: 16-Dec-1945 (75 y.o. [name] Primary Care : [name] Other Clinician: Referring : Treating /Extender: Landis Martins Weeks in Treatment: 44 Wound Status Wound Number: 1RRR Primary Diabetic Wound/Ulcer of the Lower Extremity Etiology: Wound Location: Left, Medial, Plantar Foot Wound Open Wounding Event: Gradually Appeared Status: Date Acquired: 05/11/2019 Comorbid Sleep Apnea, Deep Vein Thrombosis, Hypertension, Colitis, Type Weeks Of Treatment: 44 History: II Diabetes, Gout, Osteoarthritis, Neuropathy Clustered Wound: No Photos Wound Measurements Length: (cm) 0.3 Width: (cm) 0.3 Depth: (cm) 0.2 Area: (cm) 0.071 Volume: (cm) 0.014 % Reduction in Area: 93% % Reduction in Volume: 96.6% Epithelialization: Large (67-100%) Tunneling: No Undermining: No Wound Description Classification: Grade 2 Wound Margin: Thickened Exudate Amount: Medium Exudate Type: Serosanguineous Exudate Color: red, brown Foul Odor After Cleansing: No Slough/Fibrino No Wound Bed Granulation Amount: Large (67-100%) Exposed Structure Granulation Quality: Pink Fascia Exposed: No Necrotic Amount: None Present (0%) Fat Layer (Subcutaneous Tissue) Exposed: Yes Tendon  Exposed: No Muscle Exposed: No Joint Exposed: No Bone Exposed: No Treatment Notes Wound #1RRR (Foot) Wound Laterality: Plantar, Left, Medial Cleanser Wound Cleanser Discharge Instruction: Cleanse the wound with wound cleanser prior to applying a clean  dressing using gauze sponges, not tissue or cotton balls. Peri-Wound Care Topical Primary Dressing KerraCel Ag Gelling Fiber Dressing, 2x2 in (silver alginate) Discharge Instruction: Apply silver alginate to wound bed as instructed Secondary Dressing Woven Gauze Sponges 2x2 in Discharge Instruction: Apply over primary dressing as directed. Optifoam Non-Adhesive Dressing, 4x4 in Discharge Instruction: Apply over primary dressing cut to make foam donut Secured With [employer] Sterile, 4.5x3.1 (in/yd) Discharge Instruction: Secure with Kerlix as directed. 81M Medipore H Soft Cloth Surgical T 4 x 2 (in/yd) ape Discharge Instruction: Secure dressing with tape as directed. Compression Wrap Compression Stockings Add-Ons Electronic Signature(s) Signed: 11/20/2020 4:36:07 PM By: Sandre Kitty Signed: 11/20/2020 5:38:32 PM By: [name] RN, BSN Entered By: Sandre Kitty on 11/20/2020 16:24:00 -------------------------------------------------------------------------------- Vitals Details Patient Name: Date of Service: Lollar, Acacia Villas RBA RA E. 11/20/2020 11:00 A M Medical Record Number: [id number] Patient Account Number: [id number] Date of Birth/Sex: Treating RN: July 04, 1946 (75 y.o. [name] Primary Care : [name] Other Clinician: Referring : Treating /Extender: Adele Schilder in Treatment: 36 Vital Signs Time Taken: 11:09 Temperature (F): 98.1 Height (in): 66 Pulse (bpm): 88 Weight (lbs): 245 Respiratory Rate (breaths/min): 18 Body Mass Index (BMI): 39.5 Blood Pressure (mmHg): 144/78 Reference Range: 80 - 120 mg / dl Electronic Signature(s) Signed: 11/20/2020 4:36:07 PM By: Sandre Kitty Entered By: Sandre Kitty on 11/20/2020 11:09:28

## 2020-11-23 LAB — AEROBIC CULTURE W GRAM STAIN (SUPERFICIAL SPECIMEN): Gram Stain: NONE SEEN

## 2020-11-27 ENCOUNTER — Encounter (HOSPITAL_BASED_OUTPATIENT_CLINIC_OR_DEPARTMENT_OTHER): Payer: Medicare Other | Admitting: Internal Medicine

## 2020-11-27 ENCOUNTER — Other Ambulatory Visit: Payer: Self-pay

## 2020-11-27 DIAGNOSIS — I10 Essential (primary) hypertension: Secondary | ICD-10-CM | POA: Diagnosis not present

## 2020-11-27 DIAGNOSIS — E11621 Type 2 diabetes mellitus with foot ulcer: Secondary | ICD-10-CM

## 2020-11-27 DIAGNOSIS — L97522 Non-pressure chronic ulcer of other part of left foot with fat layer exposed: Secondary | ICD-10-CM | POA: Diagnosis not present

## 2020-11-27 DIAGNOSIS — E1140 Type 2 diabetes mellitus with diabetic neuropathy, unspecified: Secondary | ICD-10-CM | POA: Diagnosis not present

## 2020-11-27 DIAGNOSIS — Z86718 Personal history of other venous thrombosis and embolism: Secondary | ICD-10-CM | POA: Diagnosis not present

## 2020-11-27 DIAGNOSIS — Z8249 Family history of ischemic heart disease and other diseases of the circulatory system: Secondary | ICD-10-CM | POA: Diagnosis not present

## 2020-11-28 DIAGNOSIS — L4059 Other psoriatic arthropathy: Secondary | ICD-10-CM | POA: Diagnosis not present

## 2020-12-04 ENCOUNTER — Other Ambulatory Visit: Payer: Self-pay

## 2020-12-04 ENCOUNTER — Encounter (HOSPITAL_BASED_OUTPATIENT_CLINIC_OR_DEPARTMENT_OTHER): Payer: Medicare Other | Admitting: Internal Medicine

## 2020-12-04 DIAGNOSIS — L97522 Non-pressure chronic ulcer of other part of left foot with fat layer exposed: Secondary | ICD-10-CM

## 2020-12-04 DIAGNOSIS — Z8249 Family history of ischemic heart disease and other diseases of the circulatory system: Secondary | ICD-10-CM | POA: Diagnosis not present

## 2020-12-04 DIAGNOSIS — E11621 Type 2 diabetes mellitus with foot ulcer: Secondary | ICD-10-CM | POA: Diagnosis not present

## 2020-12-04 DIAGNOSIS — M14672 Charcot's joint, left ankle and foot: Secondary | ICD-10-CM

## 2020-12-04 DIAGNOSIS — E1140 Type 2 diabetes mellitus with diabetic neuropathy, unspecified: Secondary | ICD-10-CM

## 2020-12-04 DIAGNOSIS — I10 Essential (primary) hypertension: Secondary | ICD-10-CM | POA: Diagnosis not present

## 2020-12-04 DIAGNOSIS — Z86718 Personal history of other venous thrombosis and embolism: Secondary | ICD-10-CM | POA: Diagnosis not present

## 2020-12-04 NOTE — Progress Notes (Signed)
JASIME, SILL ([id number]) Visit Report for 12/04/2020 Chief Complaint Document Details Patient Name: Date of Service: DAIRIN, REBURN RA E. 12/04/2020 1:00 PM Medical Record Number: [id number] Patient Account Number: [id number] Date of Birth/Sex: Treating RN: 1946/03/05 (75 y.o. [name] Other Clinician: Referring Provider: Treating Provider/Extender: Mickle Asper in Treatment: 44 Information Obtained from: Patient Chief Complaint Left foot ulcer Electronic Signature(s) Signed: 12/04/2020 1:52:22 PM By: Kalman Shan DO Entered By: Kalman Shan on 12/04/2020 13:45:32 -------------------------------------------------------------------------------- Debridement Details Patient Name: Date of Service: [name] 12/04/2020 1:00 PM Medical Record Number: [id number] Patient Account Number: [id number] Date of Birth/Sex: Treating RN: Dec 19, 1945 (75 y.o. [name] Other Clinician: Referring Provider: Treating Provider/Extender: Mickle Asper in Treatment: 46 Debridement Performed for Assessment: Wound #[id number] Left,Medial,Plantar Foot Performed By: Physician Kalman Shan, DO Debridement Type: Debridement Severity of Tissue Pre Debridement: Fat layer exposed Level of Consciousness (Pre-procedure): Awake and Alert Pre-procedure Verification/Time Out Yes - 13:35 Taken: Start Time: 13:35 Pain Control: Lidocaine 4% T opical Solution T Area Debrided (L x W): otal 2 (cm) x 2 (cm) = 4 (cm) Tissue and other material debrided: Viable, Non-Viable, Callus, Slough, Subcutaneous, Skin: Epidermis, Slough Level: Skin/Subcutaneous Tissue Debridement Description: Excisional Instrument: Curette Bleeding: Minimum Hemostasis Achieved: Pressure End Time: 13:41 Procedural Pain: 0 Post Procedural Pain: 0 Response to Treatment: Procedure was tolerated  well Level of Consciousness (Post- Awake and Alert procedure): Post Debridement Measurements of Total Wound Length: (cm) 1.1 Width: (cm) 1.5 Depth: (cm) 0.3 Volume: (cm) 0.389 Character of Wound/Ulcer Post Debridement: Improved Severity of Tissue Post Debridement: Fat layer exposed Post Procedure Diagnosis Same as Pre-procedure Electronic Signature(s) Signed: 12/04/2020 1:52:22 PM By: Kalman Shan DO Signed: 12/04/2020 5:51:39 PM By: [name] RN, BSN Entered By: [name] on 12/04/2020 13:41:33 -------------------------------------------------------------------------------- HPI Details Patient Name: Date of Service: [place], [name] 12/04/2020 1:00 PM Medical Record Number: [id number] Patient Account Number: [id number] Date of Birth/Sex: Treating RN: [birth year] (75 y.o. [name] Other Clinician: Referring Provider: Treating Provider/Extender: Mickle Asper in Treatment: 16 History of Present Illness HPI Description: 01/17/2020 upon evaluation today patient presents for initial evaluation here in our clinic concerning issues she has been having with a left medial/plantar foot ulcer. This is actually been an issue for her since October 2020. She has been seeing Dr. Doran Durand for quite some time during that course. Fortunately there is no signs of active infection at this time. Or least no mention of this to have seen in general. With that being said unfortunately I do see some signs of erythema noted today that does have me concerned about the possibility of infection at this point in the surrounding area of the wound. There is also a warm to touch at the site which is somewhat concerning. Fortunately there is no evidence of systemic infection which is great news. The patient does have a history of diabetes mellitus type 2, Charcot foot which is what led to the wound, and hypertension. She notes that she was  in a cast for some time with Dr. Doran Durand for about 8 weeks. During that time they were utilizing according to the patient silver nitrate along with a foam doughnut and then Coban to secure in place in the cast in place. With that being said I do not have the actual records to review we are going  to try to get a hold of those unfortunately they would not flow over into care everywhere I did look today. She has been seeing Dr. Doran Durand and his physician assistant Larkin Ina up until the end of May and apparently is still seeing them on a regular basis every 2 weeks roughly. She has also tried Iodosorb without effect here. 01/24/2020 upon evaluation today patient actually appears to be doing quite well with regard to her wounds. She has been tolerating the dressing changes without complication. Fortunately there is no signs of active infection spreading which is good news. Her culture did show signs of Staph aureus I did place her on Augmentin due to the erythema surrounding the wound. With that being said the wound does appear to be doing better she has her longer walking cast/boot and I think that is actually good for her for the time being. I am considering reinitiating total contact cast when she gets back from vacation but next week she will actually be out of town at [employer] she knows not to get in the water but she still obviously is planning to enjoy herself she is going to take it easy on her foot however. 02/07/2020 upon evaluation today patient appears to be doing fairly well in regard to her ulcer on her foot. Fortunately there is no signs of severe infection at this time which is great news and overall very pleased in that regard. With that being said I do think that she could still benefit from a total contact cast. Nonetheless she is using her walking boot which at least provide some protection and that it prevents some of the friction occurring when she is ambulating. 02/14/2020 upon evaluation  today patient appears to be doing well with regard to her foot ulcer. This is actually measuring a little bit smaller yet again this week. Overall very pleased with where things stand and I do not see any signs of active infection at this time which is also good news. Since she is measuring better the patient has wanting to somewhat hold off on proceeding with the total contact cast which I think is reasonable at this point. 02/28/2020 on evaluation today patient appears to be doing well in general in regard to her wound although she has a lot of callus buildup as compared to last time I saw her. This is can require sharp debridement today. I do believe she really needs the total contact cast as well which we have discussed previous. 7/23; patient comes in for a total contact cast change 03/06/2020 on evaluation today patient appears to be doing quite well with regard to her wounds. Fortunately the wound bed is measuring smaller and looking much better there is little callus noted although there is some debridement necessary today. 03/13/2020 on evaluation today patient's wound actually appears to be doing excellent which is great news. With that being said unfortunately she is having some issues currently with her left leg where she does have cellulitis it appears. This may have come from an area that rubbed underneath the cast from last week that we noted we padded that area and it looks to be doing excellent at this point but nonetheless the leg was somewhat painful, swollen, and somewhat erythematous. She also had an elevated white blood cell count of 11.5 based on what I saw on looking at her records from the med center in [facility] from where she was seen yesterday. Unfortunately with [place] having a provider on vacation  there was no one here in the clinic in the afternoon when she called therefore she went to the ER as advised. Subsequently they did not cut off the cast as they did not have anyone from  orthopedics there to do so and subsequently also did not have the ability to do the Doppler for evaluation of DVT They recommended therefore given her dose of Eliquis as well as . Augmentin and sent her home to come see [place] today to have the cast taken off and then she is supposed to go back to have the study for DVT performed they are following. 03/20/2020 upon evaluation today patient appears to be doing well with regard to her foot all things considered we have not been able to use the total contact cast due to the infection that she had last week. She has been on the doxycycline and she had a 10-day supply of that I do believe that is helping and her leg appears to be doing better. With that being said there is fortunately no signs of active infection systemically at this time which is good news. No fevers, chills, nausea, vomiting, or diarrhea. 03/27/2020 upon evaluation today patient appears to be doing well with regard to her foot ulcer. There does not appear to be signs of active infection which is great news. Overall I am very pleased with where things stand at this point. 04/03/2020 upon evaluation today patient appears to be doing pretty well in regard to the overall appearance of her wound. Fortunately there is no signs of active infection at this time which is great news. No fevers, chills, nausea, vomiting, or diarrhea. With that being said she does have some blue-green drainage that actually is a little bit concerning to me for the possibility of Pseudomonas. I discussed that with the patient today. With that being said I do believe that we may be able to manage this however with the topical antibiotic cream as opposed to having to do anything oral especially since she seems to be doing so well with overall appearance of the wound. 04/10/2020 on evaluation today patient appears to be doing about the same roughly in regard to the overall size of her wound. With that being said  she fortunately has not shown any signs of worsening overall which is good news. I do believe that she is doing a great job trying to offload but again she may still do better with the cast. I do not see in the blue-green drainage that we noticed previously I do believe the gentamicin help in this regard. 04/17/2020 on evaluation today patient's wound appears to be doing about the same at this point. There is no significant improvement at this point. No fever chills noted. She is up for put the cast back on the day. That she states in a couple weeks she will need to have this off to go to a workshop. 04/24/2020 on evaluation today patient appears to be doing significantly better in regard to her wound. Fortunately there is no signs of active infection and overall feel like she is making great progress the cast seems to have done excellent for her. 05/01/2020 upon evaluation today patient presents for reevaluation she really does not appear to be doing too badly in regard to the actual wound on the left foot we have been managing. Unfortunately she has bilateral lower extremity edema with blisters between the webspace of her first and second toe on both feet. She has a tremendous amount  of edema in the legs which I think is where this is coming from it does not appear to be infected but nonetheless I do believe this is can be something that needs to be addressed today. Obviously this means we probably will not be putting the cast on at this point. She attributes this to the fact that she was sitting with her feet on the floor much longer during a conference last week she had a great time but unfortunately had a lot of complications as a result. 05/08/2020 upon evaluation today patient appears to be doing somewhat better in regard to her wounds at this time. Fortunately there is no signs of active infection which is great news. With that being said I do believe that the blisters have ruptured and  unfortunately did not just reattach I will remove some of the blistered tissue today. With that being said I do think the wound itself on the plantar aspect of left foot does need to have sharp debridement. 05/15/2020 upon evaluation today patient appears to be doing about the same in regard to her foot ulcer. Unfortunately in the past week her husband had a fall where he sustained a mild traumatic brain bleed. Fortunately he is doing better but being that he was in the hospital she had a walk on this a lot more. The wound does not appear to be any better is also not really appearing to be significantly worse which is good news. There is no signs of active infection at this time. 10/14; patient with a small diabetic wound on the medial part of her left foot. We have been using silver collagen a total contact cast making good progress. I think the patient had a series of blisters on her dorsal foot probably secondary to having her legs recumbent for 3 days while in a conference in [place]. We wrapped her leg last week these are all healed. We did not previously have her in compression on the right leg. 05/29/2020 upon evaluation today patient appears to be doing well with regard to the wound on the plantar aspect of her foot medially. This is measuring smaller and looking much better than last time I saw her. Again when I did see her last was 2 weeks back and the wound was significantly larger. I do believe the cast is helping and I believe the collagen is a good option for her. 06/05/2020 on evaluation today patient appears to be doing well with regard to her foot ulcer this is actually measuring significantly better and overall I feel like she is doing excellent. There is no signs of active infection at this time. 06/12/2020 upon evaluation today patient actually continues to show signs of good improvement which is excellent news. I am extremely pleased with how she seems to be progressing at this  point in regard to her wound. There is still some depth to the wound but I do believe the collagen is helping her quite a bit. 06/19/2020 upon evaluation today patient appears to be doing well with regard to her plantar foot ulcer. She is actually making excellent progress and in fact this appears to be almost completely healed. With that being said I do believe that the patient is going to actually require 1 more week in the cast although after that I am hopeful she will be ready for discharge. 06/26/2020 on evaluation today patient appears to be doing well in regard to her wound currently. Fortunately there is no signs of active  infection in general I feel like she is doing excellent. This appears to be completely healed I think she is ready to come out of the cast. 11/29; patient comes back in the clinic today with a very quick reopening in the exact same area on the medial plantar foot. She had been healed out last time. She went back into some new balance shoes that she got at [employer] with a custom insert. As it turns out this wound also happened when wearing these shoes although there was some modification made I think with the wound initially happened they added some foam around the wound area. This obviously is not going to be sufficient. 07/17/2020 on evaluation today patient appears to be doing well with regard to her wound. Fortunately she seems to be making good progress. Unfortunately she was in the hospital due to a issue with colitis and had just been discharged today in fact. She tells me that her biggest concern here is that a lot of her numbers especially her creatinine were somewhat elevated and problematic. She is can be following up with her provider in order to have a further work-up at this time. With that being said she did need to come back in for a cast change. She did not allow them to remove the cast due to the fact that she did not feel like that was the issue whatsoever and  indeed it does not appear that was the case her wound appears to be doing excellent today. 07/24/2020 upon evaluation today patient appears to be doing well in regard to her foot ulcer. She still has a small opening but this is showing signs of excellent improvement overall but they were very close to complete resolution. No fevers, chills, nausea, vomiting, or diarrhea. 07/31/2020 upon evaluation today patient actually appears to be doing excellent she is actually completely healed this is great news. Fortunately there is no signs of active infection at this time. No fevers, chills, nausea, vomiting, or diarrhea. The patient tells me that she did see Dr. Paulla Dolly in order to get to [place] so that she can have a custom shoe made. She saw him earlier last week. She does have an appointment with Liliane Channel on the sixth I believe on January she tells me Readmission: 08/28/2020 on evaluation today patient appears to be doing poorly in regard to her wound currently. She tells me this has reopened. The good news that she did get measured for and actually her shoes are on the way in from [facility].. With that being said she has been trying to stay off of this is much as possible using her wheelchair around home. Nonetheless has been somewhat difficult. The good news is her shoes should be here next week 09/04/2020 upon evaluation today patient appears to be doing well with regard to her wound. There is a little bit of callus buildup but nothing too significant she does tell me she was very active this week. Fortunately there is no signs of systemic infection at this point. The dorsal foot wound actually appears to be doing better. I think this is healed. 09/11/2020 upon evaluation today patient appears to actually be doing quite well in my opinion based on what I am seeing today. Fortunately there is no signs of infection in fact her mother is certain that this is even open any longer based on what I see. Fortunately  I think the patient has been doing everything she can to try to keep this  under control. 09/18/2020 upon evaluation today patient appears to be doing about the same in regard to her foot ulcer. She has been tolerating the dressing changes without complication. Fortunately there is no sign of active infection at this time. No fevers, chills, nausea, vomiting, or diarrhea. She is going require some sharp debridement today. 09/25/2020 upon evaluation today patient appears to be doing well with regard to his wounds she has been tolerating the dressing changes without complication. Her wound appears to be completely healed based on what I am seeing at this point. There does not appear to be any signs of active infection at this time which is great news. No fevers, chills, nausea, vomiting, or diarrhea. She did get the cushion for her foot as well that she ordered to try to help keep pressure off of this area. That looks like it may be very beneficial for her to be honest. 10/02/2020 upon evaluation today patient appears to be doing more poorly in regard to her foot ulcer. Unfortunately this has reopened since we saw her last week. It apparently did not take too long at all for this to happen. She is obviously somewhat disappointed as she was hopeful that that would be time this thing would stay closed. Fortunately there is no evidence of active infection at this point. No fevers, chills, nausea, vomiting, or diarrhea. With that being said I am good have to perform a little bit of sharp debridement clear away some of the debris currently. Including a minimal amount of callus at this time. 10/16/2020 upon evaluation today patient appears to be doing about the same in regard to her foot ulcer unfortunately. There does not appear to be any signs of active infection which is great news. No fever chills noted she has been tolerating the dressing changes without complication which is great news. Unfortunately she tells  me that she has been very depressed about the situation is getting very frustrating to her that she continues to have issues despite everything that she is trying to do to stay off of her foot. She is extremely discouraged and I do hate to hear this. Obviously we have been hopeful that if she got her shoes that would make a difference she is actually can be getting those tomorrow but I am not certain that that alone is good to be enough to get this to heal. We may end up having to consider going back into a total contact cast to get this to heal and then subsequently once we get her healed get her into her new diabetic shoes which will and my hope anyway keep this from reopening again. 10/23/2020 upon evaluation today patient appears to be doing a little worse both in regard to the size of her wound as well as in regard to the fact that she has erythema surrounding the wound and wrap around the lateral part of her foot where she tells me has been somewhat sore. Fortunately there does not appear to be any evidence of infection systemically but locally definitely there is erythema and warmth consistent with local cellulitis. I am glad to remove some of the callus as well. 10/30/2020 upon evaluation today patient appears to be doing well with regard to her wound on the plantar aspect of her foot. This is significantly improved compared to last week. 10/30/2020 upon evaluation today patient actually is making excellent progress her wound appears to show signs of great improvement which is wonderful and that she is extremely pleased  to hear this. She actually leaves Sunday to go on her trip with her half sister and friends. 11/13/2020 on evaluation today patient's wound actually appears to be doing quite well. There does not appear to be any signs of infection it has been 2 weeks since have seen her she does have a little bit of callus buildup here today but at the same time I do not believe the vacation time  set her back any just has not really made a lot of significant improvement. She is done with her antibiotics at this point. 11/20/2020 upon evaluation today patient appears to be doing poorly in regard to her foot. In fact this appears to be showing signs of Infection. She has erythema and warmth that is concerning. I know she is very discouraged as this seems to be a recurrent issue. I think we may need to delve further into the possibility of something deeper going on here as far as a structural infection. 11/27/2020 patient presents for 1 week follow-up. She had a culture of her left foot ulcer that grew staph aureus sensitive to Augmentin. This was called in by [place], [place] this morning. She is currently on doxycycline for previous culture result. Patient also had an x-ray done of her left foot that had conflicting results. We asked for clarification and was told we would have clarification on Monday. Patient states overall she is doing well. She tries to stay off of her foot is much as possible. 12/04/2020 patient presents for 1 week follow-up. She is currently taking Augmentin and tolerating this well. It was confirmed that her x-ray had no acute osseous abnormalities. We switched her to collagen from silver alginate last week. Patient is overall doing well and trying to stay off her foot is much as possible. She has no complaints today. Electronic Signature(s) Signed: 12/04/2020 1:52:22 PM By: Kalman Shan DO Entered By: Kalman Shan on 12/04/2020 13:46:48 -------------------------------------------------------------------------------- Physical Exam Details Patient Name: Date of Service: [place], [name] 12/04/2020 1:00 PM Medical Record Number: [id number] Patient Account Number: [id number] Date of Birth/Sex: Treating RN: 07-Jul-1946 (75 y.o. [name] Other Clinician: Referring Provider: Treating Provider/Extender: Mickle Asper in Treatment: 67 Constitutional respirations regular, non-labored and within target range for patient.. Cardiovascular 2+ dorsalis pedis/posterior tibialis pulses. Psychiatric pleasant and cooperative. Notes Left foot: Plantar wound with circumferential callus and necrotic debris. No signs of infection. There was good granulation tissue present after post- debridement. Electronic Signature(s) Signed: 12/04/2020 1:52:22 PM By: Kalman Shan DO Entered By: Kalman Shan on 12/04/2020 13:47:45 -------------------------------------------------------------------------------- Physician Orders Details Patient Name: Date of Service: [place], [name] 12/04/2020 1:00 PM Medical Record Number: [id number] Patient Account Number: [id number] Date of Birth/Sex: Treating RN: [birth year] (75 y.o. [name] Other Clinician: Referring Provider: Treating Provider/Extender: Mickle Asper in Treatment: 39 Verbal / Phone Orders: No Diagnosis Coding ICD-10 Coding Code Description E11.621 Type 2 diabetes mellitus with foot ulcer L97.522 Non-pressure chronic ulcer of other part of left foot with fat layer exposed M14.672 Charcot's joint, left ankle and foot E11.40 Type 2 diabetes mellitus with diabetic neuropathy, unspecified Follow-up Appointments ppointment in 1 week. - with Margarita Grizzle Return A Bathing/ Shower/ Hygiene May shower with protection but do not get wound dressing(s) wet. Off-Loading Open toe surgical shoe to: - left foot Additional Orders / Instructions Follow Nutritious Diet Wound Treatment Wound #1RRR - Foot Wound Laterality: Plantar,  Left, Medial Prim Dressing: KerraCel Ag Gelling Fiber Dressing, 4x5 in (silver alginate) Every Other Day/30 Days ary Discharge Instructions: Apply silver alginate to wound bed as instructed Secondary Dressing: Woven Gauze Sponge, Non-Sterile 4x4 in (Generic) Every Other  Day/30 Days Discharge Instructions: Apply over primary dressing as directed. Secondary Dressing: Optifoam Non-Adhesive Dressing, 4x4 in Every Other Day/30 Days Discharge Instructions: Apply over primary dressing cut to make foam donut Secured With: Conforming Stretch Gauze Bandage, Sterile 2x75 (in/in) (Generic) Every Other Day/30 Days Discharge Instructions: Secure with stretch gauze as directed. Secured With: 67M Medipore H Soft Cloth Surgical Tape, 2x2 (in/yd) (Generic) Every Other Day/30 Days Discharge Instructions: Secure dressing with tape as directed. Electronic Signature(s) Signed: 12/04/2020 1:52:22 PM By: Kalman Shan DO Entered By: Kalman Shan on 12/04/2020 13:48:05 -------------------------------------------------------------------------------- Problem List Details Patient Name: Date of Service: Ainley, [name] 12/04/2020 1:00 PM Medical Record Number: [id number] Patient Account Number: [id number] Date of Birth/Sex: Treating RN: 08-19-1945 (75 y.o. [name] Other Clinician: Referring Provider: Treating Provider/Extender: Mickle Asper in Treatment: 68 Active Problems ICD-10 Encounter Code Description Active Date MDM Diagnosis E11.621 Type 2 diabetes mellitus with foot ulcer 01/17/2020 No Yes L97.522 Non-pressure chronic ulcer of other part of left foot with fat layer exposed 01/17/2020 No Yes M14.672 Charcot's joint, left ankle and foot 01/17/2020 No Yes E11.40 Type 2 diabetes mellitus with diabetic neuropathy, unspecified 01/17/2020 No Yes Inactive Problems ICD-10 Code Description Active Date Inactive Date I10 Essential (primary) hypertension 01/17/2020 01/17/2020 Resolved Problems Electronic Signature(s) Signed: 12/04/2020 1:52:22 PM By: Kalman Shan DO Entered By: Kalman Shan on 12/04/2020 13:45:16 -------------------------------------------------------------------------------- Progress  Note Details Patient Name: Date of Service: Ciccarelli, [name] 12/04/2020 1:00 PM Medical Record Number: [id number] Patient Account Number: [id number] Date of Birth/Sex: Treating RN: 1946-07-13 (75 y.o. [name] Other Clinician: Referring Provider: Treating Provider/Extender: Mickle Asper in Treatment: 13 Subjective Chief Complaint Information obtained from Patient Left foot ulcer History of Present Illness (HPI) 01/17/2020 upon evaluation today patient presents for initial evaluation here in our clinic concerning issues she has been having with a left medial/plantar foot ulcer. This is actually been an issue for her since October 2020. She has been seeing Dr. Doran Durand for quite some time during that course. Fortunately there is no signs of active infection at this time. Or least no mention of this to have seen in general. With that being said unfortunately I do see some signs of erythema noted today that does have me concerned about the possibility of infection at this point in the surrounding area of the wound. There is also a warm to touch at the site which is somewhat concerning. Fortunately there is no evidence of systemic infection which is great news. The patient does have a history of diabetes mellitus type 2, Charcot foot which is what led to the wound, and hypertension. She notes that she was in a cast for some time with Dr. Doran Durand for about 8 weeks. During that time they were utilizing according to the patient silver nitrate along with a foam doughnut and then Coban to secure in place in the cast in place. With that being said I do not have the actual records to review we are going to try to get a hold of those unfortunately they would not flow over into care everywhere I did look today. She has been seeing Dr. Doran Durand and his  physician assistant Larkin Ina up until the end of May and apparently is still seeing them on  a regular basis every 2 weeks roughly. She has also tried Iodosorb without effect here. 01/24/2020 upon evaluation today patient actually appears to be doing quite well with regard to her wounds. She has been tolerating the dressing changes without complication. Fortunately there is no signs of active infection spreading which is good news. Her culture did show signs of Staph aureus I did place her on Augmentin due to the erythema surrounding the wound. With that being said the wound does appear to be doing better she has her longer walking cast/boot and I think that is actually good for her for the time being. I am considering reinitiating total contact cast when she gets back from vacation but next week she will actually be out of town at [employer] she knows not to get in the water but she still obviously is planning to enjoy herself she is going to take it easy on her foot however. 02/07/2020 upon evaluation today patient appears to be doing fairly well in regard to her ulcer on her foot. Fortunately there is no signs of severe infection at this time which is great news and overall very pleased in that regard. With that being said I do think that she could still benefit from a total contact cast. Nonetheless she is using her walking boot which at least provide some protection and that it prevents some of the friction occurring when she is ambulating. 02/14/2020 upon evaluation today patient appears to be doing well with regard to her foot ulcer. This is actually measuring a little bit smaller yet again this week. Overall very pleased with where things stand and I do not see any signs of active infection at this time which is also good news. Since she is measuring better the patient has wanting to somewhat hold off on proceeding with the total contact cast which I think is reasonable at this point. 02/28/2020 on evaluation today patient appears to be doing well in general in regard to her wound although  she has a lot of callus buildup as compared to last time I saw her. This is can require sharp debridement today. I do believe she really needs the total contact cast as well which we have discussed previous. 7/23; patient comes in for a total contact cast change 03/06/2020 on evaluation today patient appears to be doing quite well with regard to her wounds. Fortunately the wound bed is measuring smaller and looking much better there is little callus noted although there is some debridement necessary today. 03/13/2020 on evaluation today patient's wound actually appears to be doing excellent which is great news. With that being said unfortunately she is having some issues currently with her left leg where she does have cellulitis it appears. This may have come from an area that rubbed underneath the cast from last week that we noted we padded that area and it looks to be doing excellent at this point but nonetheless the leg was somewhat painful, swollen, and somewhat erythematous. She also had an elevated white blood cell count of 11.5 based on what I saw on looking at her records from the med center in [facility] from where she was seen yesterday. Unfortunately with [place] having a provider on vacation there was no one here in the clinic in the afternoon when she called therefore she went to the ER as advised. Subsequently they did not cut off  the cast as they did not have anyone from orthopedics there to do so and subsequently also did not have the ability to do the Doppler for evaluation of DVT They recommended therefore given her dose of Eliquis as well as . Augmentin and sent her home to come see [place] today to have the cast taken off and then she is supposed to go back to have the study for DVT performed they are following. 03/20/2020 upon evaluation today patient appears to be doing well with regard to her foot all things considered we have not been able to use the total contact cast due to the infection  that she had last week. She has been on the doxycycline and she had a 10-day supply of that I do believe that is helping and her leg appears to be doing better. With that being said there is fortunately no signs of active infection systemically at this time which is good news. No fevers, chills, nausea, vomiting, or diarrhea. 03/27/2020 upon evaluation today patient appears to be doing well with regard to her foot ulcer. There does not appear to be signs of active infection which is great news. Overall I am very pleased with where things stand at this point. 04/03/2020 upon evaluation today patient appears to be doing pretty well in regard to the overall appearance of her wound. Fortunately there is no signs of active infection at this time which is great news. No fevers, chills, nausea, vomiting, or diarrhea. With that being said she does have some blue-green drainage that actually is a little bit concerning to me for the possibility of Pseudomonas. I discussed that with the patient today. With that being said I do believe that we may be able to manage this however with the topical antibiotic cream as opposed to having to do anything oral especially since she seems to be doing so well with overall appearance of the wound. 04/10/2020 on evaluation today patient appears to be doing about the same roughly in regard to the overall size of her wound. With that being said she fortunately has not shown any signs of worsening overall which is good news. I do believe that she is doing a great job trying to offload but again she may still do better with the cast. I do not see in the blue-green drainage that we noticed previously I do believe the gentamicin help in this regard. 04/17/2020 on evaluation today patient's wound appears to be doing about the same at this point. There is no significant improvement at this point. No fever chills noted. She is up for put the cast back on the day. That she states in a couple  weeks she will need to have this off to go to a workshop. 04/24/2020 on evaluation today patient appears to be doing significantly better in regard to her wound. Fortunately there is no signs of active infection and overall feel like she is making great progress the cast seems to have done excellent for her. 05/01/2020 upon evaluation today patient presents for reevaluation she really does not appear to be doing too badly in regard to the actual wound on the left foot we have been managing. Unfortunately she has bilateral lower extremity edema with blisters between the webspace of her first and second toe on both feet. She has a tremendous amount of edema in the legs which I think is where this is coming from it does not appear to be infected but nonetheless I do believe this is  can be something that needs to be addressed today. Obviously this means we probably will not be putting the cast on at this point. She attributes this to the fact that she was sitting with her feet on the floor much longer during a conference last week she had a great time but unfortunately had a lot of complications as a result. 05/08/2020 upon evaluation today patient appears to be doing somewhat better in regard to her wounds at this time. Fortunately there is no signs of active infection which is great news. With that being said I do believe that the blisters have ruptured and unfortunately did not just reattach I will remove some of the blistered tissue today. With that being said I do think the wound itself on the plantar aspect of left foot does need to have sharp debridement. 05/15/2020 upon evaluation today patient appears to be doing about the same in regard to her foot ulcer. Unfortunately in the past week her husband had a fall where he sustained a mild traumatic brain bleed. Fortunately he is doing better but being that he was in the hospital she had a walk on this a lot more. The wound does not appear to be any better  is also not really appearing to be significantly worse which is good news. There is no signs of active infection at this time. 10/14; patient with a small diabetic wound on the medial part of her left foot. We have been using silver collagen a total contact cast making good progress. I think the patient had a series of blisters on her dorsal foot probably secondary to having her legs recumbent for 3 days while in a conference in [place]. We wrapped her leg last week these are all healed. We did not previously have her in compression on the right leg. 05/29/2020 upon evaluation today patient appears to be doing well with regard to the wound on the plantar aspect of her foot medially. This is measuring smaller and looking much better than last time I saw her. Again when I did see her last was 2 weeks back and the wound was significantly larger. I do believe the cast is helping and I believe the collagen is a good option for her. 06/05/2020 on evaluation today patient appears to be doing well with regard to her foot ulcer this is actually measuring significantly better and overall I feel like she is doing excellent. There is no signs of active infection at this time. 06/12/2020 upon evaluation today patient actually continues to show signs of good improvement which is excellent news. I am extremely pleased with how she seems to be progressing at this point in regard to her wound. There is still some depth to the wound but I do believe the collagen is helping her quite a bit. 06/19/2020 upon evaluation today patient appears to be doing well with regard to her plantar foot ulcer. She is actually making excellent progress and in fact this appears to be almost completely healed. With that being said I do believe that the patient is going to actually require 1 more week in the cast although after that I am hopeful she will be ready for discharge. 06/26/2020 on evaluation today patient appears to be doing well  in regard to her wound currently. Fortunately there is no signs of active infection in general I feel like she is doing excellent. This appears to be completely healed I think she is ready to come out of the cast. 11/29;  patient comes back in the clinic today with a very quick reopening in the exact same area on the medial plantar foot. She had been healed out last time. She went back into some new balance shoes that she got at [employer] with a custom insert. As it turns out this wound also happened when wearing these shoes although there was some modification made I think with the wound initially happened they added some foam around the wound area. This obviously is not going to be sufficient. 07/17/2020 on evaluation today patient appears to be doing well with regard to her wound. Fortunately she seems to be making good progress. Unfortunately she was in the hospital due to a issue with colitis and had just been discharged today in fact. She tells me that her biggest concern here is that a lot of her numbers especially her creatinine were somewhat elevated and problematic. She is can be following up with her provider in order to have a further work-up at this time. With that being said she did need to come back in for a cast change. She did not allow them to remove the cast due to the fact that she did not feel like that was the issue whatsoever and indeed it does not appear that was the case her wound appears to be doing excellent today. 07/24/2020 upon evaluation today patient appears to be doing well in regard to her foot ulcer. She still has a small opening but this is showing signs of excellent improvement overall but they were very close to complete resolution. No fevers, chills, nausea, vomiting, or diarrhea. 07/31/2020 upon evaluation today patient actually appears to be doing excellent she is actually completely healed this is great news. Fortunately there is no signs of active infection at  this time. No fevers, chills, nausea, vomiting, or diarrhea. The patient tells me that she did see Dr. Paulla Dolly in order to get to [place] so that she can have a custom shoe made. She saw him earlier last week. She does have an appointment with Liliane Channel on the sixth I believe on January she tells me Readmission: 08/28/2020 on evaluation today patient appears to be doing poorly in regard to her wound currently. She tells me this has reopened. The good news that she did get measured for and actually her shoes are on the way in from [facility].. With that being said she has been trying to stay off of this is much as possible using her wheelchair around home. Nonetheless has been somewhat difficult. The good news is her shoes should be here next week 09/04/2020 upon evaluation today patient appears to be doing well with regard to her wound. There is a little bit of callus buildup but nothing too significant she does tell me she was very active this week. Fortunately there is no signs of systemic infection at this point. The dorsal foot wound actually appears to be doing better. I think this is healed. 09/11/2020 upon evaluation today patient appears to actually be doing quite well in my opinion based on what I am seeing today. Fortunately there is no signs of infection in fact her mother is certain that this is even open any longer based on what I see. Fortunately I think the patient has been doing everything she can to try to keep this under control. 09/18/2020 upon evaluation today patient appears to be doing about the same in regard to her foot ulcer. She has been tolerating the dressing changes without complication.  Fortunately there is no sign of active infection at this time. No fevers, chills, nausea, vomiting, or diarrhea. She is going require some sharp debridement today. 09/25/2020 upon evaluation today patient appears to be doing well with regard to his wounds she has been tolerating the dressing changes  without complication. Her wound appears to be completely healed based on what I am seeing at this point. There does not appear to be any signs of active infection at this time which is great news. No fevers, chills, nausea, vomiting, or diarrhea. She did get the cushion for her foot as well that she ordered to try to help keep pressure off of this area. That looks like it may be very beneficial for her to be honest. 10/02/2020 upon evaluation today patient appears to be doing more poorly in regard to her foot ulcer. Unfortunately this has reopened since we saw her last week. It apparently did not take too long at all for this to happen. She is obviously somewhat disappointed as she was hopeful that that would be time this thing would stay closed. Fortunately there is no evidence of active infection at this point. No fevers, chills, nausea, vomiting, or diarrhea. With that being said I am good have to perform a little bit of sharp debridement clear away some of the debris currently. Including a minimal amount of callus at this time. 10/16/2020 upon evaluation today patient appears to be doing about the same in regard to her foot ulcer unfortunately. There does not appear to be any signs of active infection which is great news. No fever chills noted she has been tolerating the dressing changes without complication which is great news. Unfortunately she tells me that she has been very depressed about the situation is getting very frustrating to her that she continues to have issues despite everything that she is trying to do to stay off of her foot. She is extremely discouraged and I do hate to hear this. Obviously we have been hopeful that if she got her shoes that would make a difference she is actually can be getting those tomorrow but I am not certain that that alone is good to be enough to get this to heal. We may end up having to consider going back into a total contact cast to get this to heal and then  subsequently once we get her healed get her into her new diabetic shoes which will and my hope anyway keep this from reopening again. 10/23/2020 upon evaluation today patient appears to be doing a little worse both in regard to the size of her wound as well as in regard to the fact that she has erythema surrounding the wound and wrap around the lateral part of her foot where she tells me has been somewhat sore. Fortunately there does not appear to be any evidence of infection systemically but locally definitely there is erythema and warmth consistent with local cellulitis. I am glad to remove some of the callus as well. 10/30/2020 upon evaluation today patient appears to be doing well with regard to her wound on the plantar aspect of her foot. This is significantly improved compared to last week. 10/30/2020 upon evaluation today patient actually is making excellent progress her wound appears to show signs of great improvement which is wonderful and that she is extremely pleased to hear this. She actually leaves Sunday to go on her trip with her half sister and friends. 11/13/2020 on evaluation today patient's wound actually appears to be  doing quite well. There does not appear to be any signs of infection it has been 2 weeks since have seen her she does have a little bit of callus buildup here today but at the same time I do not believe the vacation time set her back any just has not really made a lot of significant improvement. She is done with her antibiotics at this point. 11/20/2020 upon evaluation today patient appears to be doing poorly in regard to her foot. In fact this appears to be showing signs of Infection. She has erythema and warmth that is concerning. I know she is very discouraged as this seems to be a recurrent issue. I think we may need to delve further into the possibility of something deeper going on here as far as a structural infection. 11/27/2020 patient presents for 1 week follow-up.  She had a culture of her left foot ulcer that grew staph aureus sensitive to Augmentin. This was called in by [place], [place] this morning. She is currently on doxycycline for previous culture result. Patient also had an x-ray done of her left foot that had conflicting results. We asked for clarification and was told we would have clarification on Monday. Patient states overall she is doing well. She tries to stay off of her foot is much as possible. 12/04/2020 patient presents for 1 week follow-up. She is currently taking Augmentin and tolerating this well. It was confirmed that her x-ray had no acute osseous abnormalities. We switched her to collagen from silver alginate last week. Patient is overall doing well and trying to stay off her foot is much as possible. She has no complaints today. Patient History Information obtained from Patient. Family History Cancer - Father, Heart Disease - Mother, Hypertension - Mother, Stroke - Father,Mother, No family history of Diabetes, Hereditary Spherocytosis, Kidney Disease, Lung Disease, Seizures, Thyroid Problems, Tuberculosis. Social History Never smoker, Marital Status - Single, Alcohol Use - Rarely, Drug Use - No History, Caffeine Use - Daily - coffee. Medical History Respiratory Patient has history of Sleep Apnea - bipap Cardiovascular Patient has history of Deep Vein Thrombosis - right calf, Hypertension Gastrointestinal Patient has history of Colitis Endocrine Patient has history of Type II Diabetes Musculoskeletal Patient has history of Gout, Osteoarthritis Neurologic Patient has history of Neuropathy Medical A Surgical History Notes nd Cardiovascular Hyperlipidemia Integumentary (Skin) Psoriasis Musculoskeletal Charcot foot Objective Constitutional respirations regular, non-labored and within target range for patient.. Vitals Time Taken: 1:05 PM, Height: 66 in, Weight: 245 lbs, BMI: 39.5, Temperature: 99.1 F, Pulse: 72 bpm,  Respiratory Rate: 20 breaths/min, Blood Pressure: 155/73 mmHg. Cardiovascular 2+ dorsalis pedis/posterior tibialis pulses. Psychiatric pleasant and cooperative. General Notes: Left foot: Plantar wound with circumferential callus and necrotic debris. No signs of infection. There was good granulation tissue present after post-debridement. Integumentary (Hair, Skin) Wound #1RRR status is Open. Original cause of wound was Gradually Appeared. The date acquired was: 05/11/2019. The wound has been in treatment 46 weeks. The wound is located on the [place]. The wound measures 1.1cm length x 0.6cm width x 0.3cm depth; 0.518cm^2 area and 0.156cm^3 volume. There is Fat Layer (Subcutaneous Tissue) exposed. There is no tunneling or undermining noted. There is a medium amount of serosanguineous drainage noted. The wound margin is thickened. There is large (67-100%) red, pink granulation within the wound bed. There is a small (1-33%) amount of necrotic tissue within the wound bed including Adherent Slough. General Notes: callous periwound. Assessment Active Problems ICD-10 Type 2 diabetes mellitus with  foot ulcer Non-pressure chronic ulcer of other part of left foot with fat layer exposed Charcot's joint, left ankle and foot Type 2 diabetes mellitus with diabetic neuropathy, unspecified Patients wound is stable. There are no signs of infection. There is more maceration noted and I would like to switch her back to silver alginate. There is significant callus buildup in 1 week and this was debrided as well as the necrotic tissue. There was good granulation tissue underneath. I think she would benefit greatly from a contact cast. She is not prepared for this today but will be for next week. Procedures Wound #1RRR Pre-procedure diagnosis of Wound #1RRR is a Diabetic Wound/Ulcer of the Lower Extremity located on the Left,Medial,Plantar Foot .Severity of Tissue Pre Debridement is: Fat layer  exposed. There was a Excisional Skin/Subcutaneous Tissue Debridement with a total area of 4 sq cm performed by Kalman Shan, DO. With the following instrument(s): Curette to remove Viable and Non-Viable tissue/material. Material removed includes Callus, Subcutaneous Tissue, Slough, and Skin: Epidermis after achieving pain control using Lidocaine 4% Topical Solution. No specimens were taken. A time out was conducted at 13:35, prior to the start of the procedure. A Minimum amount of bleeding was controlled with Pressure. The procedure was tolerated well with a pain level of 0 throughout and a pain level of 0 following the procedure. Post Debridement Measurements: 1.1cm length x 1.5cm width x 0.3cm depth; 0.389cm^3 volume. Character of Wound/Ulcer Post Debridement is improved. Severity of Tissue Post Debridement is: Fat layer exposed. Post procedure Diagnosis Wound #1RRR: Same as Pre-Procedure Plan Follow-up Appointments: Return Appointment in 1 week. - with Glynn Octave Shower/ Hygiene: May shower with protection but do not get wound dressing(s) wet. Off-Loading: Open toe surgical shoe to: - left foot Additional Orders / Instructions: Follow Nutritious Diet WOUND #1RRR: - Foot Wound Laterality: Plantar, Left, Medial Prim Dressing: KerraCel Ag Gelling Fiber Dressing, 4x5 in (silver alginate) Every Other Day/30 Days ary Discharge Instructions: Apply silver alginate to wound bed as instructed Secondary Dressing: Woven Gauze Sponge, Non-Sterile 4x4 in (Generic) Every Other Day/30 Days Discharge Instructions: Apply over primary dressing as directed. Secondary Dressing: Optifoam Non-Adhesive Dressing, 4x4 in Every Other Day/30 Days Discharge Instructions: Apply over primary dressing cut to make foam donut Secured With: Conforming Stretch Gauze Bandage, Sterile 2x75 (in/in) (Generic) Every Other Day/30 Days Discharge Instructions: Secure with stretch gauze as directed. Secured With: 33M Medipore  H Soft Cloth Surgical T ape, 2x2 (in/yd) (Generic) Every Other Day/30 Days Discharge Instructions: Secure dressing with tape as directed. 1. Silver alginate every other day 2. Aggressive offloading 3. Follow-up in 1 week for potential TCC Electronic Signature(s) Signed: 12/04/2020 1:52:22 PM By: Kalman Shan DO Entered By: Kalman Shan on 12/04/2020 13:50:46 -------------------------------------------------------------------------------- HxROS Details Patient Name: Date of Service: [place], [name] 12/04/2020 1:00 PM Medical Record Number: [id number] Patient Account Number: [id number] Date of Birth/Sex: Treating RN: 17-Aug-1945 (76 y.o. [name] Other Clinician: Referring Provider: Treating Provider/Extender: Mickle Asper in Treatment: 7 Information Obtained From Patient Respiratory Medical History: Positive for: Sleep Apnea - bipap Cardiovascular Medical History: Positive for: Deep Vein Thrombosis - right calf; Hypertension Past Medical History Notes: Hyperlipidemia Gastrointestinal Medical History: Positive for: Colitis Endocrine Medical History: Positive for: Type II Diabetes Time with diabetes: 2 years Treated with: Insulin, Oral agents Blood sugar tested every day: No Integumentary (Skin) Medical History: Past Medical History Notes: Psoriasis Musculoskeletal Medical History: Positive for: Gout; Osteoarthritis Past  Medical History Notes: Charcot foot Neurologic Medical History: Positive for: Neuropathy Immunizations Pneumococcal Vaccine: Received Pneumococcal Vaccination: Yes Implantable Devices None Family and Social History Cancer: Yes - Father; Diabetes: No; Heart Disease: Yes - Mother; Hereditary Spherocytosis: No; Hypertension: Yes - Mother; Kidney Disease: No; Lung Disease: No; Seizures: No; Stroke: Yes - Father,Mother; Thyroid Problems: No; Tuberculosis: No; Never smoker;  Marital Status - Single; Alcohol Use: Rarely; Drug Use: No History; Caffeine Use: Daily - coffee; Financial Concerns: No; Food, Clothing or Shelter Needs: No; Support System Lacking: No; Transportation Concerns: No Electronic Signature(s) Signed: 12/04/2020 1:52:22 PM By: Kalman Shan DO Signed: 12/04/2020 5:51:39 PM By: [name] RN, BSN Entered By: Kalman Shan on 12/04/2020 13:47:04 -------------------------------------------------------------------------------- [place] Details Patient Name: Date of Service: [name] 12/04/2020 Medical Record Number: [id number] Patient Account Number: [id number] Date of Birth/Sex: Treating RN: 1946/01/02 (75 y.o. [name], [name] Primary Care Provider: [name] Other Clinician: Referring Provider: Treating Provider/Extender: Mickle Asper in Treatment: 42 Diagnosis Coding ICD-10 Codes Code Description E11.621 Type 2 diabetes mellitus with foot ulcer L97.522 Non-pressure chronic ulcer of other part of left foot with fat layer exposed M14.672 Charcot's joint, left ankle and foot E11.40 Type 2 diabetes mellitus with diabetic neuropathy, unspecified Facility Procedures CPT4 Code: [id number] Description: 042 - DEB SUBQ TISSUE 20 SQ CM/< 1 Modifier: Quantity: Electronic Signature(s) Signed: 12/04/2020 1:52:22 PM By: Kalman Shan DO Entered By: Kalman Shan on 12/04/2020 13:51:57

## 2020-12-04 NOTE — Progress Notes (Signed)
[name] [name], [name] [name] ([id number]) Visit Report for 12/04/2020 Arrival Information Details Patient Name: Date of Service: [name] [name], [name] [name] [name] E. 12/04/2020 1:00 PM Medical Record Number: [id number] Patient Account Number: [id number] Date of Birth/Sex: Treating RN: [birth year] (75 y.o. [name] [name] Primary Care Shreyansh Tiffany: [name] Other Clinician: Referring Kaylib Furness: Treating Kaire Stary/Extender: Mickle Asper in Treatment: 40 Visit Information History Since Last Visit Added or deleted any medications: No Patient Arrived: Wheel Chair Any new allergies or adverse reactions: No Arrival Time: 13:05 Had a fall or experienced change in No Accompanied By: self activities of daily living that may affect Transfer Assistance: None risk of falls: Patient Identification Verified: Yes Signs or symptoms of abuse/neglect since No Secondary Verification Process Completed: Yes last visito Patient Requires Transmission-Based Precautions: No Hospitalized since last visit: No Patient Has Alerts: No Implantable device outside of the clinic No excluding cellular tissue based products placed in the center since last visit: Has Dressing in Place as Prescribed: Yes Has Footwear/Offloading in Place as Yes Prescribed: Left: Surgical [name] with Pressure Relief Insole Pain Present Now: No Electronic Signature(s) Signed: 12/04/2020 5:19:23 PM By: Deon Pilling Entered By: Deon Pilling on 12/04/2020 13:12:35 -------------------------------------------------------------------------------- Encounter Discharge Information Details Patient Name: Date of Service: [name] Amato [name] E. 12/04/2020 1:00 PM Medical Record Number: [id number] Patient Account Number: [id number] Date of Birth/Sex: Treating RN: 12-16-1945 (75 y.o. [name] [name] Primary Care Syaire Saber: [name] Other Clinician: Referring Lucion Dilger: Treating Iliany Losier/Extender: Mickle Asper in Treatment:  71 Encounter Discharge Information Items Post Procedure Vitals Discharge Condition: Stable Temperature (F): 99.1 Ambulatory Status: Wheelchair Pulse (bpm): 72 Discharge Destination: Home Respiratory Rate (breaths/min): 20 Transportation: Private Auto Blood Pressure (mmHg): 155/73 Schedule Follow-up Appointment: Yes Clinical Summary of Care: Provided on 12/04/2020 Form Type Recipient Paper Patient Patient Electronic Signature(s) Signed: 12/04/2020 1:44:08 PM By: Lorrin Jackson Entered By: Lorrin Jackson on 12/04/2020 13:44:07 -------------------------------------------------------------------------------- Lower Extremity Assessment Details Patient Name: Date of Service: [name] Amato [name] E. 12/04/2020 1:00 PM Medical Record Number: [id number] Patient Account Number: [id number] Date of Birth/Sex: Treating RN: January 17, 1946 (75 y.o. [name] [name] Primary Care Zanya Lindo: [name] Other Clinician: Referring Aneth Schlagel: Treating Angelene Rome/Extender: Lucile Crater Weeks in Treatment: 21 Edema Assessment Assessed: [Left: Yes] [Right: No] Edema: [Left: Ye] [Right: s] Calf Left: Right: Point of Measurement: 29 cm From Medial Instep 39 cm Ankle Left: Right: Point of Measurement: 12 cm From Medial Instep 23.5 cm Vascular Assessment Pulses: Dorsalis Pedis Palpable: [Left:Yes] Electronic Signature(s) Signed: 12/04/2020 5:19:23 PM By: Deon Pilling Entered By: Deon Pilling on 12/04/2020 13:13:15 -------------------------------------------------------------------------------- Multi Wound Chart Details Patient Name: Date of Service: [name] Amato [name] E. 12/04/2020 1:00 PM Medical Record Number: [id number] Patient Account Number: [id number] Date of Birth/Sex: Treating RN: 01-Aug-1946 (75 y.o. [name] [name] Primary Care Ismahan Lippman: [name] Other Clinician: Referring Tacara Hadlock: Treating Malachi Kinzler/Extender: Mickle Asper in Treatment: 51 Vital  Signs Height(in): 66 Pulse(bpm): 72 Weight(lbs): 245 Blood Pressure(mmHg): 155/73 Body Mass Index(BMI): 40 Temperature(F): 99.1 Respiratory Rate(breaths/min): 20 Photos: [1RRR:No Photos Left, Medial, Plantar Foot] [N/A:N/A N/A] Wound Location: [1RRR:Gradually Appeared] [N/A:N/A] Wounding Event: [1RRR:Diabetic Wound/Ulcer of the Lower] [N/A:N/A] Primary Etiology: [1RRR:Extremity Sleep Apnea, Deep Vein Thrombosis, N/A] [N/A:N/A] Comorbid History: [1RRR:Hypertension, Colitis, Type II Diabetes, Gout, Osteoarthritis, Neuropathy 05/11/2019] [N/A:N/A N/A] Date Acquired: [1RRR:46] [N/A:N/A N/A] Weeks of Treatment: [1RRR:Open] [N/A:N/A N/A] Wound Status: [1RRR:Yes] [N/A:N/A N/A] Wound Recurrence: [1RRR:1.1x0.6x0.3] [N/A:N/A N/A] Measurements L x W x D (cm) [1RRR:0.518] [N/A:N/A N/A] A (cm) :  rea [1RRR:0.156] [N/A:N/A N/A] Volume (cm) : [1RRR:49.30%] [N/A:N/A N/A] % Reduction in A [1RRR:rea: 61.80%] [N/A:N/A N/A] % Reduction in Volume: [1RRR:Grade 2] [N/A:N/A N/A] Classification: [1RRR:Medium] [N/A:N/A N/A] Exudate A mount: [1RRR:Serosanguineous] [N/A:N/A N/A] Exudate Type: [1RRR:red, brown] [N/A:N/A N/A] Exudate Color: [1RRR:Thickened] [N/A:N/A N/A] Wound Margin: [1RRR:Large (67-100%)] [N/A:N/A N/A] Granulation A mount: [1RRR:Red, Pink] [N/A:N/A N/A] Granulation Quality: [1RRR:Small (1-33%)] [N/A:N/A N/A] Necrotic A mount: [1RRR:Fat Layer (Subcutaneous Tissue): Yes N/A] [N/A:N/A] Exposed Structures: [1RRR:Fascia: No Tendon: No Muscle: No Joint: No Bone: No Medium (34-66%)] [N/A:N/A N/A] Epithelialization: [1RRR:Debridement - Excisional] [N/A:N/A N/A] Debridement: Pre-procedure Verification/Time Out 13:35 [N/A:N/A N/A] Taken: [1RRR:Lidocaine 4% Topical Solution] [N/A:N/A N/A] Pain Control: [1RRR:Callus, Subcutaneous, Slough] [N/A:N/A N/A] Tissue Debrided: [1RRR:Skin/Subcutaneous Tissue] [N/A:N/A N/A] Level: [1RRR:4] [N/A:N/A N/A] Debridement A (sq cm): [1RRR:rea Curette] [N/A:N/A  N/A] Instrument: [1RRR:Minimum] [N/A:N/A N/A] Bleeding: [1RRR:Pressure] [N/A:N/A N/A] Hemostasis A chieved: [1RRR:0] [N/A:N/A N/A] Procedural Pain: [1RRR:0] [N/A:N/A N/A] Post Procedural Pain: [1RRR:Procedure was tolerated well] [N/A:N/A N/A] Debridement Treatment Response: [1RRR:1.1x1.5x0.3] [N/A:N/A N/A] Post Debridement Measurements L x W x D (cm) [1RRR:0.389] [N/A:N/A N/A] Post Debridement Volume: (cm) [1RRR:callous periwound.] [N/A:N/A N/A] Assessment Notes: [1RRR:Debridement] [N/A:N/A N/A] Treatment Notes Wound #1RRR (Foot) Wound Laterality: Plantar, Left, Medial Cleanser Peri-Wound Care Topical Primary Dressing KerraCel Ag Gelling Fiber Dressing, 4x5 in (silver alginate) Discharge Instruction: Apply silver alginate to wound bed as instructed Secondary Dressing Woven Gauze Sponge, Non-Sterile 4x4 in Discharge Instruction: Apply over primary dressing as directed. Optifoam Non-Adhesive Dressing, 4x4 in Discharge Instruction: Apply over primary dressing cut to make foam donut Secured With Conforming Stretch Gauze Bandage, Sterile 2x75 (in/in) Discharge Instruction: Secure with stretch gauze as directed. 60M Medipore H Soft Cloth Surgical T ape, 2x2 (in/yd) Discharge Instruction: Secure dressing with tape as directed. Compression Wrap Compression Stockings Add-Ons Electronic Signature(s) Signed: 12/04/2020 1:52:22 PM By: Kalman Shan DO Signed: 12/04/2020 5:51:39 PM By: [name] RN, BSN Entered By: Kalman Shan on 12/04/2020 13:45:22 -------------------------------------------------------------------------------- Multi-Disciplinary Care Plan Details Patient Name: Date of Service: [name] [name] [name] [name] E. 12/04/2020 1:00 PM Medical Record Number: [id number] Patient Account Number: [id number] Date of Birth/Sex: Treating RN: 1945/12/02 (75 y.o. [name] [name] Primary Care Inell Mimbs: [name] Other Clinician: Referring Mira Balon: Treating Loella Hickle/Extender:  Mickle Asper in Treatment: [place] reviewed with physician Active Inactive Nutrition Nursing Diagnoses: Impaired glucose control: actual or potential Potential for alteratiion in Nutrition/Potential for imbalanced nutrition Goals: Patient/caregiver verbalizes understanding of need to maintain therapeutic glucose control per primary care physician Date Initiated: 01/17/2020 Date Inactivated: 02/14/2020 Target Resolution Date: 02/14/2020 Goal Status: Met Patient/caregiver will maintain therapeutic glucose control Date Initiated: 01/17/2020 Target Resolution Date: 12/18/2020 Goal Status: Active Interventions: Assess HgA1c results as ordered upon admission and as needed Assess patient nutrition upon admission and as needed per policy Provide education on elevated blood sugars and impact on wound healing Treatment Activities: Patient referred to Primary Care Physician for further nutritional evaluation : 01/17/2020 Notes: Wound/Skin Impairment Nursing Diagnoses: Impaired tissue integrity Knowledge deficit related to ulceration/compromised skin integrity Goals: Patient/caregiver will verbalize understanding of skin care regimen Date Initiated: 01/17/2020 Target Resolution Date: 12/18/2020 Goal Status: Active Ulcer/skin breakdown will have a volume reduction of 30% by week 4 Date Initiated: 01/17/2020 Date Inactivated: 02/14/2020 Target Resolution Date: 02/14/2020 Goal Status: Met Ulcer/skin breakdown will have a volume reduction of 50% by week 8 Date Initiated: 02/14/2020 Date Inactivated: 03/13/2020 Target Resolution Date: 03/13/2020 Goal Status: Met Ulcer/skin breakdown will have a volume reduction of 80% by week 12 Date Initiated:  03/13/2020 Date Inactivated: 04/10/2020 Target Resolution Date: 04/10/2020 Goal Status: Unmet Unmet Reason: infection Interventions: Assess patient/caregiver ability to obtain necessary supplies Assess patient/caregiver  ability to perform ulcer/skin care regimen upon admission and as needed Assess ulceration(s) every visit Provide education on ulcer and skin care Treatment Activities: Skin care regimen initiated : 01/17/2020 Topical wound management initiated : 01/17/2020 Notes: Electronic Signature(s) Signed: 12/04/2020 5:51:39 PM By: [name] RN, BSN Entered By: [name] on 12/04/2020 13:35:50 -------------------------------------------------------------------------------- Pain Assessment Details Patient Name: Date of Service: [name] Amato [name] E. 12/04/2020 1:00 PM Medical Record Number: [id number] Patient Account Number: [id number] Date of Birth/Sex: Treating RN: 1946-07-10 (75 y.o. [name] [name] Primary Care Jerrel Tiberio: [name] Other Clinician: Referring Daliyah Sramek: Treating Jermar Colter/Extender: Mickle Asper in Treatment: 68 Active Problems Location of Pain Severity and Description of Pain Patient Has Paino No Site Locations Rate the pain. Current Pain Level: 0 Pain Management and Medication Current Pain Management: Medication: No Cold Application: No Rest: No Massage: No Activity: No T.[name]: No Heat Application: No Leg drop or elevation: No Is the Current Pain Management Adequate: Adequate How does your wound impact your activities of daily livingo Sleep: No Bathing: No Appetite: No Relationship With Others: No Bladder Continence: No Emotions: No Bowel Continence: No Work: No Toileting: No Drive: No Dressing: No Hobbies: No Electronic Signature(s) Signed: 12/04/2020 5:19:23 PM By: Deon Pilling Entered By: Deon Pilling on 12/04/2020 13:13:05 -------------------------------------------------------------------------------- Patient/Caregiver Education Details Patient Name: Date of Service: [name] Amato [name] E. 4/27/2022andnbsp1:00 PM Medical Record Number: [id number] Patient Account Number: [id number] Date of Birth/Gender: Treating  RN: [birth year] (75 y.o. [name] [name] Primary Care Physician: [name] Other Clinician: Referring Physician: Treating Physician/Extender: Mickle Asper in Treatment: 31 Education Assessment Education Provided To: Patient Education Topics Provided Elevated Blood Sugar/ Impact on Healing: Methods: Explain/Verbal Responses: Reinforcements needed, State content correctly Offloading: Methods: Explain/Verbal Responses: Reinforcements needed, State content correctly Wound/Skin Impairment: Methods: Explain/Verbal Responses: Reinforcements needed, State content correctly Electronic Signature(s) Signed: 12/04/2020 5:51:39 PM By: [name] RN, BSN Entered By: [name] on 12/04/2020 13:36:20 -------------------------------------------------------------------------------- Wound Assessment Details Patient Name: Date of Service: [name] Amato [name] E. 12/04/2020 1:00 PM Medical Record Number: [id number] Patient Account Number: [id number] Date of Birth/Sex: Treating RN: 06-17-1946 (75 y.o. [name] [name], [name] [name] Primary Care Lekita Kerekes: [name] Other Clinician: Referring Haillee Johann: Treating Aurthur Wingerter/Extender: Lucile Crater Weeks in Treatment: 62 Wound Status Wound Number: 1RRR Primary Diabetic Wound/Ulcer of the Lower Extremity Etiology: Wound Location: Left, Medial, Plantar Foot Wound Open Wounding Event: Gradually Appeared Status: Date Acquired: 05/11/2019 Comorbid Sleep Apnea, Deep Vein Thrombosis, Hypertension, Colitis, Type Weeks Of Treatment: 46 History: II Diabetes, Gout, Osteoarthritis, Neuropathy Clustered Wound: No Photos Wound Measurements Length: (cm) 1.1 Width: (cm) 0.6 Depth: (cm) 0.3 Area: (cm) 0.518 Volume: (cm) 0.156 % Reduction in Area: 49.3% % Reduction in Volume: 61.8% Epithelialization: Medium (34-66%) Tunneling: No Undermining: No Wound Description Classification: Grade 2 Wound Margin:  Thickened Exudate Amount: Medium Exudate Type: Serosanguineous Exudate Color: red, brown Foul Odor After Cleansing: No Slough/Fibrino Yes Wound Bed Granulation Amount: Large (67-100%) Exposed Structure Granulation Quality: Red, Pink Fascia Exposed: No Necrotic Amount: Small (1-33%) Fat Layer (Subcutaneous Tissue) Exposed: Yes Necrotic Quality: Adherent Slough Tendon Exposed: No Muscle Exposed: No Joint Exposed: No Bone Exposed: No Assessment Notes callous periwound. Treatment Notes Wound #1RRR (Foot) Wound Laterality: Plantar, Left, Medial Cleanser Peri-Wound Care Topical Primary Dressing KerraCel Ag Gelling Fiber Dressing, 4x5 in (silver alginate) Discharge  Instruction: Apply silver alginate to wound bed as instructed Secondary Dressing Woven Gauze Sponge, Non-Sterile 4x4 in Discharge Instruction: Apply over primary dressing as directed. Optifoam Non-Adhesive Dressing, 4x4 in Discharge Instruction: Apply over primary dressing cut to make foam donut Secured With Conforming Stretch Gauze Bandage, Sterile 2x75 (in/in) Discharge Instruction: Secure with stretch gauze as directed. 4M Medipore H Soft Cloth Surgical T ape, 2x2 (in/yd) Discharge Instruction: Secure dressing with tape as directed. Compression Wrap Compression Stockings Add-Ons Electronic Signature(s) Signed: 12/04/2020 4:46:04 PM By: Sandre Kitty Signed: 12/04/2020 5:19:23 PM By: Deon Pilling Entered By: Sandre Kitty on 12/04/2020 16:24:57 -------------------------------------------------------------------------------- Vitals Details Patient Name: Date of Service: [name] [name], [name] [name] [name] E. 12/04/2020 1:00 PM Medical Record Number: [id number] Patient Account Number: [id number] Date of Birth/Sex: Treating RN: Sep 09, 1945 (75 y.o. [name] [name], [name] [name] Primary Care Nikole Swartzentruber: [name] Other Clinician: Referring Anas Reister: Treating Adelisa Satterwhite/Extender: Mickle Asper in Treatment: 56 Vital  Signs Time Taken: 13:05 Temperature (F): 99.1 Height (in): 66 Pulse (bpm): 72 Weight (lbs): 245 Respiratory Rate (breaths/min): 20 Body Mass Index (BMI): 39.5 Blood Pressure (mmHg): 155/73 Reference Range: 80 - 120 mg / dl Electronic Signature(s) Signed: 12/04/2020 5:19:23 PM By: Deon Pilling Entered By: Deon Pilling on 12/04/2020 13:12:53

## 2020-12-05 ENCOUNTER — Other Ambulatory Visit: Payer: Self-pay | Admitting: Internal Medicine

## 2020-12-07 ENCOUNTER — Other Ambulatory Visit: Payer: Self-pay | Admitting: Internal Medicine

## 2020-12-07 MED ORDER — RYBELSUS 3 MG PO TABS: 3.0000 mg | ORAL_TABLET | Freq: Every day | ORAL | 0 refills | Status: DC

## 2020-12-07 NOTE — Addendum Note (Signed)
Addended by: Binnie Rail on: 12/07/2020 05:20 PM   Modules accepted: Orders

## 2020-12-11 ENCOUNTER — Encounter (HOSPITAL_BASED_OUTPATIENT_CLINIC_OR_DEPARTMENT_OTHER): Payer: Medicare Other | Attending: Physician Assistant | Admitting: Physician Assistant

## 2020-12-11 ENCOUNTER — Other Ambulatory Visit: Payer: Self-pay

## 2020-12-11 DIAGNOSIS — Z86718 Personal history of other venous thrombosis and embolism: Secondary | ICD-10-CM | POA: Insufficient documentation

## 2020-12-11 DIAGNOSIS — E785 Hyperlipidemia, unspecified: Secondary | ICD-10-CM | POA: Diagnosis not present

## 2020-12-11 DIAGNOSIS — M14672 Charcot's joint, left ankle and foot: Secondary | ICD-10-CM | POA: Diagnosis not present

## 2020-12-11 DIAGNOSIS — E11621 Type 2 diabetes mellitus with foot ulcer: Secondary | ICD-10-CM | POA: Insufficient documentation

## 2020-12-11 DIAGNOSIS — L97522 Non-pressure chronic ulcer of other part of left foot with fat layer exposed: Secondary | ICD-10-CM | POA: Diagnosis not present

## 2020-12-11 DIAGNOSIS — Z8249 Family history of ischemic heart disease and other diseases of the circulatory system: Secondary | ICD-10-CM | POA: Diagnosis not present

## 2020-12-11 DIAGNOSIS — I10 Essential (primary) hypertension: Secondary | ICD-10-CM | POA: Diagnosis not present

## 2020-12-11 DIAGNOSIS — E1140 Type 2 diabetes mellitus with diabetic neuropathy, unspecified: Secondary | ICD-10-CM | POA: Diagnosis not present

## 2020-12-11 DIAGNOSIS — G4730 Sleep apnea, unspecified: Secondary | ICD-10-CM | POA: Insufficient documentation

## 2020-12-11 NOTE — Progress Notes (Addendum)
[name] [name] [name], [name] [name] [name] ([id number]) Visit Report for 12/11/2020 Chief Complaint Document Details Patient Name: Date of Service: [name] [name] [name], [name] [name] 12/11/2020 8:15 A M Medical Record Number: [id number] Patient Account Number: [id number] Date of Birth/Sex: Treating RN: 08-02-1946 (75 y.o. [name] [name] [name] Primary Care Provider: [name] Other Clinician: Referring Provider: Treating Provider/Extender: Adele Schilder in Treatment: 48 Information Obtained from: Patient Chief Complaint Left foot ulcer Electronic Signature(s) Signed: 12/11/2020 8:59:34 AM By: Worthy Keeler PA-C Entered By: Worthy Keeler on 12/11/2020 08:59:34 -------------------------------------------------------------------------------- Debridement Details Patient Name: Date of Service: [name] [name] [name] 12/11/2020 8:15 A M Medical Record Number: [id number] Patient Account Number: [id number] Date of Birth/Sex: Treating RN: [birth year] (75 y.o. [name] [name] [name] Primary Care Provider: [name] Other Clinician: Referring Provider: Treating Provider/Extender: Adele Schilder in Treatment: 47 Debridement Performed for Assessment: Wound #[id number] Left,Medial,Plantar Foot Performed By: Physician Worthy Keeler, PA Debridement Type: Debridement Severity of Tissue Pre Debridement: Fat layer exposed Level of Consciousness (Pre-procedure): Awake and Alert Pre-procedure Verification/Time Out Yes - 09:05 Taken: Start Time: 09:05 Pain Control: Other : benzocaine 20% spray T Area Debrided (L x W): otal 3 (cm) x 2.5 (cm) = 7.5 (cm) Tissue and other material debrided: Viable, Non-Viable, Callus, Subcutaneous, Skin: Epidermis Level: Skin/Subcutaneous Tissue Debridement Description: Excisional Instrument: Curette Bleeding: Minimum Hemostasis Achieved: Pressure End Time: 09:10 Procedural Pain: 0 Post Procedural Pain: 0 Response to Treatment: Procedure was tolerated well Level of  Consciousness (Post- Awake and Alert procedure): Post Debridement Measurements of Total Wound Length: (cm) 0.8 Width: (cm) 0.7 Depth: (cm) 0.2 Volume: (cm) 0.088 Character of Wound/Ulcer Post Debridement: Improved Severity of Tissue Post Debridement: Fat layer exposed Post Procedure Diagnosis Same as Pre-procedure Electronic Signature(s) Signed: 12/11/2020 6:39:46 PM By: Worthy Keeler PA-C Signed: 12/11/2020 7:02:06 PM By: [name] RN, BSN Entered By: [name] on 12/11/2020 09:07:44 -------------------------------------------------------------------------------- HPI Details Patient Name: Date of Service: [name] [name] [name], [name] [name] [name] 12/11/2020 8:15 A M Medical Record Number: [id number] Patient Account Number: [id number] Date of Birth/Sex: Treating RN: 1945/10/30 (75 y.o. [name] [name] [name] Primary Care Provider: [name] Other Clinician: Referring Provider: Treating Provider/Extender: Adele Schilder in Treatment: 69 History of Present Illness HPI Description: 01/17/2020 upon evaluation today patient presents for initial evaluation here in our clinic concerning issues she has been having with a left medial/plantar foot ulcer. This is actually been an issue for her since October 2020. She has been seeing Dr. Doran Durand for quite some time during that course. Fortunately there is no signs of active infection at this time. Or least no mention of this to have seen in general. With that being said unfortunately I do see some signs of erythema noted today that does have me concerned about the possibility of infection at this point in the surrounding area of the wound. There is also a warm to touch at the site which is somewhat concerning. Fortunately there is no evidence of systemic infection which is great news. The patient does have a history of diabetes mellitus type 2, Charcot foot which is what led to the wound, and hypertension. She notes that she was in a cast for some  time with Dr. Doran Durand for about 8 weeks. During that time they were utilizing according to the patient silver nitrate along with a foam doughnut and then Coban to secure in place in the cast in place. With that being said I do not have  the actual records to review we are going to try to get a hold of those unfortunately they would not flow over into care everywhere I did look today. She has been seeing Dr. Doran Durand and his physician assistant Larkin Ina up until the end of May and apparently is still seeing them on a regular basis every 2 weeks roughly. She has also tried Iodosorb without effect here. 01/24/2020 upon evaluation today patient actually appears to be doing quite well with regard to her wounds. She has been tolerating the dressing changes without complication. Fortunately there is no signs of active infection spreading which is good news. Her culture did show signs of Staph aureus I did place her on Augmentin due to the erythema surrounding the wound. With that being said the wound does appear to be doing better she has her longer walking cast/boot and I think that is actually good for her for the time being. I am considering reinitiating total contact cast when she gets back from vacation but next week she will actually be out of town at [employer] she knows not to get in the water but she still obviously is planning to enjoy herself she is going to take it easy on her foot however. 02/07/2020 upon evaluation today patient appears to be doing fairly well in regard to her ulcer on her foot. Fortunately there is no signs of severe infection at this time which is great news and overall very pleased in that regard. With that being said I do think that she could still benefit from a total contact cast. Nonetheless she is using her walking boot which at least provide some protection and that it prevents some of the friction occurring when she is ambulating. 02/14/2020 upon evaluation today patient appears  to be doing well with regard to her foot ulcer. This is actually measuring a little bit smaller yet again this week. Overall very pleased with where things stand and I do not see any signs of active infection at this time which is also good news. Since she is measuring better the patient has wanting to somewhat hold off on proceeding with the total contact cast which I think is reasonable at this point. 02/28/2020 on evaluation today patient appears to be doing well in general in regard to her wound although she has a lot of callus buildup as compared to last time I saw her. This is can require sharp debridement today. I do believe she really needs the total contact cast as well which we have discussed previous. 7/23; patient comes in for a total contact cast change 03/06/2020 on evaluation today patient appears to be doing quite well with regard to her wounds. Fortunately the wound bed is measuring smaller and looking much better there is little callus noted although there is some debridement necessary today. 03/13/2020 on evaluation today patient's wound actually appears to be doing excellent which is great news. With that being said unfortunately she is having some issues currently with her left leg where she does have cellulitis it appears. This may have come from an area that rubbed underneath the cast from last week that we noted we padded that area and it looks to be doing excellent at this point but nonetheless the leg was somewhat painful, swollen, and somewhat erythematous. She also had an elevated white blood cell count of 11.5 based on what I saw on looking at her records from the med center in [facility] from where she was seen yesterday.  Unfortunately with [place] having a provider on vacation there was no one here in the clinic in the afternoon when she called therefore she went to the ER as advised. Subsequently they did not cut off the cast as they did not have anyone from orthopedics there to  do so and subsequently also did not have the ability to do the Doppler for evaluation of DVT They recommended therefore given her dose of Eliquis as well as . Augmentin and sent her home to come see [place] today to have the cast taken off and then she is supposed to go back to have the study for DVT performed they are following. 03/20/2020 upon evaluation today patient appears to be doing well with regard to her foot all things considered we have not been able to use the total contact cast due to the infection that she had last week. She has been on the doxycycline and she had a 10-day supply of that I do believe that is helping and her leg appears to be doing better. With that being said there is fortunately no signs of active infection systemically at this time which is good news. No fevers, chills, nausea, vomiting, or diarrhea. 03/27/2020 upon evaluation today patient appears to be doing well with regard to her foot ulcer. There does not appear to be signs of active infection which is great news. Overall I am very pleased with where things stand at this point. 04/03/2020 upon evaluation today patient appears to be doing pretty well in regard to the overall appearance of her wound. Fortunately there is no signs of active infection at this time which is great news. No fevers, chills, nausea, vomiting, or diarrhea. With that being said she does have some blue-green drainage that actually is a little bit concerning to me for the possibility of Pseudomonas. I discussed that with the patient today. With that being said I do believe that we may be able to manage this however with the topical antibiotic cream as opposed to having to do anything oral especially since she seems to be doing so well with overall appearance of the wound. 04/10/2020 on evaluation today patient appears to be doing about the same roughly in regard to the overall size of her wound. With that being said she fortunately has not shown any  signs of worsening overall which is good news. I do believe that she is doing a great job trying to offload but again she may still do better with the cast. I do not see in the blue-green drainage that we noticed previously I do believe the gentamicin help in this regard. 04/17/2020 on evaluation today patient's wound appears to be doing about the same at this point. There is no significant improvement at this point. No fever chills noted. She is up for put the cast back on the day. That she states in a couple weeks she will need to have this off to go to a workshop. 04/24/2020 on evaluation today patient appears to be doing significantly better in regard to her wound. Fortunately there is no signs of active infection and overall feel like she is making great progress the cast seems to have done excellent for her. 05/01/2020 upon evaluation today patient presents for reevaluation she really does not appear to be doing too badly in regard to the actual wound on the left foot we have been managing. Unfortunately she has bilateral lower extremity edema with blisters between the webspace of her first and second toe  on both feet. She has a tremendous amount of edema in the legs which I think is where this is coming from it does not appear to be infected but nonetheless I do believe this is can be something that needs to be addressed today. Obviously this means we probably will not be putting the cast on at this point. She attributes this to the fact that she was sitting with her feet on the floor much longer during a conference last week she had a great time but unfortunately had a lot of complications as a result. 05/08/2020 upon evaluation today patient appears to be doing somewhat better in regard to her wounds at this time. Fortunately there is no signs of active infection which is great news. With that being said I do believe that the blisters have ruptured and unfortunately did not just reattach I will  remove some of the blistered tissue today. With that being said I do think the wound itself on the plantar aspect of left foot does need to have sharp debridement. 05/15/2020 upon evaluation today patient appears to be doing about the same in regard to her foot ulcer. Unfortunately in the past week her husband had a fall where he sustained a mild traumatic brain bleed. Fortunately he is doing better but being that he was in the hospital she had a walk on this a lot more. The wound does not appear to be any better is also not really appearing to be significantly worse which is good news. There is no signs of active infection at this time. 10/14; patient with a small diabetic wound on the medial part of her left foot. We have been using silver collagen a total contact cast making good progress. I think the patient had a series of blisters on her dorsal foot probably secondary to having her legs recumbent for 3 days while in a conference in [place]. We wrapped her leg last week these are all healed. We did not previously have her in compression on the right leg. 05/29/2020 upon evaluation today patient appears to be doing well with regard to the wound on the plantar aspect of her foot medially. This is measuring smaller and looking much better than last time I saw her. Again when I did see her last was 2 weeks back and the wound was significantly larger. I do believe the cast is helping and I believe the collagen is a good option for her. 06/05/2020 on evaluation today patient appears to be doing well with regard to her foot ulcer this is actually measuring significantly better and overall I feel like she is doing excellent. There is no signs of active infection at this time. 06/12/2020 upon evaluation today patient actually continues to show signs of good improvement which is excellent news. I am extremely pleased with how she seems to be progressing at this point in regard to her wound. There is still  some depth to the wound but I do believe the collagen is helping her quite a bit. 06/19/2020 upon evaluation today patient appears to be doing well with regard to her plantar foot ulcer. She is actually making excellent progress and in fact this appears to be almost completely healed. With that being said I do believe that the patient is going to actually require 1 more week in the cast although after that I am hopeful she will be ready for discharge. 06/26/2020 on evaluation today patient appears to be doing well in regard to her wound  currently. Fortunately there is no signs of active infection in general I feel like she is doing excellent. This appears to be completely healed I think she is ready to come out of the cast. 11/29; patient comes back in the clinic today with a very quick reopening in the exact same area on the medial plantar foot. She had been healed out last time. She went back into some new balance shoes that she got at [employer] with a custom insert. As it turns out this wound also happened when wearing these shoes although there was some modification made I think with the wound initially happened they added some foam around the wound area. This obviously is not going to be sufficient. 07/17/2020 on evaluation today patient appears to be doing well with regard to her wound. Fortunately she seems to be making good progress. Unfortunately she was in the hospital due to a issue with colitis and had just been discharged today in fact. She tells me that her biggest concern here is that a lot of her numbers especially her creatinine were somewhat elevated and problematic. She is can be following up with her provider in order to have a further work-up at this time. With that being said she did need to come back in for a cast change. She did not allow them to remove the cast due to the fact that she did not feel like that was the issue whatsoever and indeed it does not appear that was the case  her wound appears to be doing excellent today. 07/24/2020 upon evaluation today patient appears to be doing well in regard to her foot ulcer. She still has a small opening but this is showing signs of excellent improvement overall but they were very close to complete resolution. No fevers, chills, nausea, vomiting, or diarrhea. 07/31/2020 upon evaluation today patient actually appears to be doing excellent she is actually completely healed this is great news. Fortunately there is no signs of active infection at this time. No fevers, chills, nausea, vomiting, or diarrhea. The patient tells me that she did see Dr. Paulla Dolly in order to get to [place] so that she can have a custom shoe made. She saw him earlier last week. She does have an appointment with Liliane Channel on the sixth I believe on January she tells me Readmission: 08/28/2020 on evaluation today patient appears to be doing poorly in regard to her wound currently. She tells me this has reopened. The good news that she did get measured for and actually her shoes are on the way in from [facility].. With that being said she has been trying to stay off of this is much as possible using her wheelchair around home. Nonetheless has been somewhat difficult. The good news is her shoes should be here next week 09/04/2020 upon evaluation today patient appears to be doing well with regard to her wound. There is a little bit of callus buildup but nothing too significant she does tell me she was very active this week. Fortunately there is no signs of systemic infection at this point. The dorsal foot wound actually appears to be doing better. I think this is healed. 09/11/2020 upon evaluation today patient appears to actually be doing quite well in my opinion based on what I am seeing today. Fortunately there is no signs of infection in fact her mother is certain that this is even open any longer based on what I see. Fortunately I think the patient has been doing  everything she can to try to keep this under control. 09/18/2020 upon evaluation today patient appears to be doing about the same in regard to her foot ulcer. She has been tolerating the dressing changes without complication. Fortunately there is no sign of active infection at this time. No fevers, chills, nausea, vomiting, or diarrhea. She is going require some sharp debridement today. 09/25/2020 upon evaluation today patient appears to be doing well with regard to his wounds she has been tolerating the dressing changes without complication. Her wound appears to be completely healed based on what I am seeing at this point. There does not appear to be any signs of active infection at this time which is great news. No fevers, chills, nausea, vomiting, or diarrhea. She did get the cushion for her foot as well that she ordered to try to help keep pressure off of this area. That looks like it may be very beneficial for her to be honest. 10/02/2020 upon evaluation today patient appears to be doing more poorly in regard to her foot ulcer. Unfortunately this has reopened since we saw her last week. It apparently did not take too long at all for this to happen. She is obviously somewhat disappointed as she was hopeful that that would be time this thing would stay closed. Fortunately there is no evidence of active infection at this point. No fevers, chills, nausea, vomiting, or diarrhea. With that being said I am good have to perform a little bit of sharp debridement clear away some of the debris currently. Including a minimal amount of callus at this time. 10/16/2020 upon evaluation today patient appears to be doing about the same in regard to her foot ulcer unfortunately. There does not appear to be any signs of active infection which is great news. No fever chills noted she has been tolerating the dressing changes without complication which is great news. Unfortunately she tells me that she has been very  depressed about the situation is getting very frustrating to her that she continues to have issues despite everything that she is trying to do to stay off of her foot. She is extremely discouraged and I do hate to hear this. Obviously we have been hopeful that if she got her shoes that would make a difference she is actually can be getting those tomorrow but I am not certain that that alone is good to be enough to get this to heal. We may end up having to consider going back into a total contact cast to get this to heal and then subsequently once we get her healed get her into her new diabetic shoes which will and my hope anyway keep this from reopening again. 10/23/2020 upon evaluation today patient appears to be doing a little worse both in regard to the size of her wound as well as in regard to the fact that she has erythema surrounding the wound and wrap around the lateral part of her foot where she tells me has been somewhat sore. Fortunately there does not appear to be any evidence of infection systemically but locally definitely there is erythema and warmth consistent with local cellulitis. I am glad to remove some of the callus as well. 10/30/2020 upon evaluation today patient appears to be doing well with regard to her wound on the plantar aspect of her foot. This is significantly improved compared to last week. 10/30/2020 upon evaluation today patient actually is making excellent progress her wound appears to show signs of great improvement which  is wonderful and that she is extremely pleased to hear this. She actually leaves Sunday to go on her trip with her half sister and friends. 11/13/2020 on evaluation today patient's wound actually appears to be doing quite well. There does not appear to be any signs of infection it has been 2 weeks since have seen her she does have a little bit of callus buildup here today but at the same time I do not believe the vacation time set her back any just  has not really made a lot of significant improvement. She is done with her antibiotics at this point. 11/20/2020 upon evaluation today patient appears to be doing poorly in regard to her foot. In fact this appears to be showing signs of Infection. She has erythema and warmth that is concerning. I know she is very discouraged as this seems to be a recurrent issue. I think we may need to delve further into the possibility of something deeper going on here as far as a structural infection. 11/27/2020 patient presents for 1 week follow-up. She had a culture of her left foot ulcer that grew staph aureus sensitive to Augmentin. This was called in by [place], [place] this morning. She is currently on doxycycline for previous culture result. Patient also had an x-ray done of her left foot that had conflicting results. We asked for clarification and was told we would have clarification on Monday. Patient states overall she is doing well. She tries to stay off of her foot is much as possible. 12/04/2020 patient presents for 1 week follow-up. She is currently taking Augmentin and tolerating this well. It was confirmed that her x-ray had no acute osseous abnormalities. We switched her to collagen from silver alginate last week. Patient is overall doing well and trying to stay off her foot is much as possible. She has no complaints today. 12/11/2020 upon evaluation today patient appears to be doing well with regard to her wound. She has been tolerating the dressing changes without complication. Fortunately there is no signs of active infection noted at this point. I think she is definitely ready to go back into the cast. Electronic Signature(s) Signed: 12/11/2020 9:13:23 AM By: Worthy Keeler PA-C Entered By: Worthy Keeler on 12/11/2020 09:13:23 -------------------------------------------------------------------------------- Physical Exam Details Patient Name: Date of Service: [name] [name] [name], [name] [name] [name] 12/11/2020 8:15 A M Medical  Record Number: [id number] Patient Account Number: [id number] Date of Birth/Sex: Treating RN: 05/19/1946 (75 y.o. [name] [name] [name] Primary Care Provider: [name] Other Clinician: Referring Provider: Treating Provider/Extender: Adele Schilder in Treatment: 50 Constitutional Well-nourished and well-hydrated in no acute distress. Respiratory normal breathing without difficulty. Psychiatric this patient is able to make decisions and demonstrates good insight into disease process. Alert and Oriented x 3. pleasant and cooperative. Notes Patient's wound bed showed signs of good granulation epithelization there was some slough noted and minimal amount of callus I did perform sharp debridement to clear away some of the necrotic debris here and she tolerated that without complication. Post debridement wound bed appears to be doing much better. I do think the cast is appropriate and therefore we did go ahead and reapply the total contact cast today. Electronic Signature(s) Signed: 12/11/2020 9:13:44 AM By: Worthy Keeler PA-C Entered By: Worthy Keeler on 12/11/2020 09:13:44 -------------------------------------------------------------------------------- Physician Orders Details Patient Name: Date of Service: [name] [name] [name], [name] [name] [name] 12/11/2020 8:15 A M Medical Record Number: [id number] Patient Account Number: [id number] Date of Birth/Sex: Treating  RN: Sep 10, 1945 (75 y.o. [name] [name] [name] Primary Care Provider: [name] Other Clinician: Referring Provider: Treating Provider/Extender: Adele Schilder in Treatment: 48 Verbal / Phone Orders: No Diagnosis Coding ICD-10 Coding Code Description E11.621 Type 2 diabetes mellitus with foot ulcer L97.522 Non-pressure chronic ulcer of other part of left foot with fat layer exposed M14.672 Charcot's joint, left ankle and foot E11.40 Type 2 diabetes mellitus with diabetic neuropathy, unspecified Follow-up  Appointments ppointment in 1 week. - with Margarita Grizzle Return A Bathing/ Shower/ Hygiene May shower with protection but do not get wound dressing(s) wet. Off-Loading Total Contact Cast to Left Lower Extremity Additional Orders / Instructions Follow Nutritious Diet Wound Treatment Wound #1RRR - Foot Wound Laterality: Plantar, Left, Medial Prim Dressing: KerraCel Ag Gelling Fiber Dressing, 4x5 in (silver alginate) Every Other Day/30 Days ary Discharge Instructions: Apply silver alginate to wound bed as instructed Secondary Dressing: Woven Gauze Sponge, Non-Sterile 4x4 in (Generic) Every Other Day/30 Days Discharge Instructions: Apply over primary dressing as directed. Secondary Dressing: Optifoam Non-Adhesive Dressing, 4x4 in Every Other Day/30 Days Discharge Instructions: Apply over primary dressing cut to make foam donut Secured With: 49M [place] Surgical Tape, 2x2 (in/yd) (Generic) Every Other Day/30 Days Discharge Instructions: Secure dressing with tape as directed. Patient Medications llergies: bee venom protein (honey bee), Sulfa (Sulfonamide Antibiotics), Farxiga A Notifications Medication Indication Start End prior to debridement 12/11/2020 benzocaine DOSE topical 20 % aerosol - aerosol topical Electronic Signature(s) Signed: 12/11/2020 6:39:46 PM By: Worthy Keeler PA-C Signed: 12/11/2020 7:02:06 PM By: [name] RN, BSN Entered By: [name] on 12/11/2020 09:09:34 -------------------------------------------------------------------------------- Problem List Details Patient Name: Date of Service: [name] [name] [name] 12/11/2020 8:15 A M Medical Record Number: [id number] Patient Account Number: [id number] Date of Birth/Sex: Treating RN: Jun 09, 1946 (75 y.o. [name] [name] [name] Primary Care Provider: [name] Other Clinician: Referring Provider: Treating Provider/Extender: Adele Schilder in Treatment: 79 Active  Problems ICD-10 Encounter Code Description Active Date MDM Diagnosis E11.621 Type 2 diabetes mellitus with foot ulcer 01/17/2020 No Yes L97.522 Non-pressure chronic ulcer of other part of left foot with fat layer exposed 01/17/2020 No Yes M14.672 Charcot's joint, left ankle and foot 01/17/2020 No Yes E11.40 Type 2 diabetes mellitus with diabetic neuropathy, unspecified 01/17/2020 No Yes Inactive Problems ICD-10 Code Description Active Date Inactive Date I10 Essential (primary) hypertension 01/17/2020 01/17/2020 Resolved Problems Electronic Signature(s) Signed: 12/11/2020 8:59:25 AM By: Worthy Keeler PA-C Entered By: Worthy Keeler on 12/11/2020 08:59:25 -------------------------------------------------------------------------------- Progress Note Details Patient Name: Date of Service: [name] [name] [name], [name] [name] [name] 12/11/2020 8:15 A M Medical Record Number: [id number] Patient Account Number: [id number] Date of Birth/Sex: Treating RN: [birth year] (75 y.o. [name] [name] [name] Primary Care Provider: [name] Other Clinician: Referring Provider: Treating Provider/Extender: Adele Schilder in Treatment: 74 Subjective Chief Complaint Information obtained from Patient Left foot ulcer History of Present Illness (HPI) 01/17/2020 upon evaluation today patient presents for initial evaluation here in our clinic concerning issues she has been having with a left medial/plantar foot ulcer. This is actually been an issue for her since October 2020. She has been seeing Dr. Doran Durand for quite some time during that course. Fortunately there is no signs of active infection at this time. Or least no mention of this to have seen in general. With that being said unfortunately I do see some signs of erythema noted today that does have me concerned about the possibility of infection  at this point in the surrounding area of the wound. There is also a warm to touch at the site which is somewhat concerning.  Fortunately there is no evidence of systemic infection which is great news. The patient does have a history of diabetes mellitus type 2, Charcot foot which is what led to the wound, and hypertension. She notes that she was in a cast for some time with Dr. Doran Durand for about 8 weeks. During that time they were utilizing according to the patient silver nitrate along with a foam doughnut and then Coban to secure in place in the cast in place. With that being said I do not have the actual records to review we are going to try to get a hold of those unfortunately they would not flow over into care everywhere I did look today. She has been seeing Dr. Doran Durand and his physician assistant Larkin Ina up until the end of May and apparently is still seeing them on a regular basis every 2 weeks roughly. She has also tried Iodosorb without effect here. 01/24/2020 upon evaluation today patient actually appears to be doing quite well with regard to her wounds. She has been tolerating the dressing changes without complication. Fortunately there is no signs of active infection spreading which is good news. Her culture did show signs of Staph aureus I did place her on Augmentin due to the erythema surrounding the wound. With that being said the wound does appear to be doing better she has her longer walking cast/boot and I think that is actually good for her for the time being. I am considering reinitiating total contact cast when she gets back from vacation but next week she will actually be out of town at [employer] she knows not to get in the water but she still obviously is planning to enjoy herself she is going to take it easy on her foot however. 02/07/2020 upon evaluation today patient appears to be doing fairly well in regard to her ulcer on her foot. Fortunately there is no signs of severe infection at this time which is great news and overall very pleased in that regard. With that being said I do think that she could  still benefit from a total contact cast. Nonetheless she is using her walking boot which at least provide some protection and that it prevents some of the friction occurring when she is ambulating. 02/14/2020 upon evaluation today patient appears to be doing well with regard to her foot ulcer. This is actually measuring a little bit smaller yet again this week. Overall very pleased with where things stand and I do not see any signs of active infection at this time which is also good news. Since she is measuring better the patient has wanting to somewhat hold off on proceeding with the total contact cast which I think is reasonable at this point. 02/28/2020 on evaluation today patient appears to be doing well in general in regard to her wound although she has a lot of callus buildup as compared to last time I saw her. This is can require sharp debridement today. I do believe she really needs the total contact cast as well which we have discussed previous. 7/23; patient comes in for a total contact cast change 03/06/2020 on evaluation today patient appears to be doing quite well with regard to her wounds. Fortunately the wound bed is measuring smaller and looking much better there is little callus noted although there is some debridement necessary today. 03/13/2020  on evaluation today patient's wound actually appears to be doing excellent which is great news. With that being said unfortunately she is having some issues currently with her left leg where she does have cellulitis it appears. This may have come from an area that rubbed underneath the cast from last week that we noted we padded that area and it looks to be doing excellent at this point but nonetheless the leg was somewhat painful, swollen, and somewhat erythematous. She also had an elevated white blood cell count of 11.5 based on what I saw on looking at her records from the med center in [facility] from where she was seen yesterday. Unfortunately  with [place] having a provider on vacation there was no one here in the clinic in the afternoon when she called therefore she went to the ER as advised. Subsequently they did not cut off the cast as they did not have anyone from orthopedics there to do so and subsequently also did not have the ability to do the Doppler for evaluation of DVT They recommended therefore given her dose of Eliquis as well as . Augmentin and sent her home to come see [place] today to have the cast taken off and then she is supposed to go back to have the study for DVT performed they are following. 03/20/2020 upon evaluation today patient appears to be doing well with regard to her foot all things considered we have not been able to use the total contact cast due to the infection that she had last week. She has been on the doxycycline and she had a 10-day supply of that I do believe that is helping and her leg appears to be doing better. With that being said there is fortunately no signs of active infection systemically at this time which is good news. No fevers, chills, nausea, vomiting, or diarrhea. 03/27/2020 upon evaluation today patient appears to be doing well with regard to her foot ulcer. There does not appear to be signs of active infection which is great news. Overall I am very pleased with where things stand at this point. 04/03/2020 upon evaluation today patient appears to be doing pretty well in regard to the overall appearance of her wound. Fortunately there is no signs of active infection at this time which is great news. No fevers, chills, nausea, vomiting, or diarrhea. With that being said she does have some blue-green drainage that actually is a little bit concerning to me for the possibility of Pseudomonas. I discussed that with the patient today. With that being said I do believe that we may be able to manage this however with the topical antibiotic cream as opposed to having to do anything oral especially since she  seems to be doing so well with overall appearance of the wound. 04/10/2020 on evaluation today patient appears to be doing about the same roughly in regard to the overall size of her wound. With that being said she fortunately has not shown any signs of worsening overall which is good news. I do believe that she is doing a great job trying to offload but again she may still do better with the cast. I do not see in the blue-green drainage that we noticed previously I do believe the gentamicin help in this regard. 04/17/2020 on evaluation today patient's wound appears to be doing about the same at this point. There is no significant improvement at this point. No fever chills noted. She is up for put the cast back  on the day. That she states in a couple weeks she will need to have this off to go to a workshop. 04/24/2020 on evaluation today patient appears to be doing significantly better in regard to her wound. Fortunately there is no signs of active infection and overall feel like she is making great progress the cast seems to have done excellent for her. 05/01/2020 upon evaluation today patient presents for reevaluation she really does not appear to be doing too badly in regard to the actual wound on the left foot we have been managing. Unfortunately she has bilateral lower extremity edema with blisters between the webspace of her first and second toe on both feet. She has a tremendous amount of edema in the legs which I think is where this is coming from it does not appear to be infected but nonetheless I do believe this is can be something that needs to be addressed today. Obviously this means we probably will not be putting the cast on at this point. She attributes this to the fact that she was sitting with her feet on the floor much longer during a conference last week she had a great time but unfortunately had a lot of complications as a result. 05/08/2020 upon evaluation today patient appears to be  doing somewhat better in regard to her wounds at this time. Fortunately there is no signs of active infection which is great news. With that being said I do believe that the blisters have ruptured and unfortunately did not just reattach I will remove some of the blistered tissue today. With that being said I do think the wound itself on the plantar aspect of left foot does need to have sharp debridement. 05/15/2020 upon evaluation today patient appears to be doing about the same in regard to her foot ulcer. Unfortunately in the past week her husband had a fall where he sustained a mild traumatic brain bleed. Fortunately he is doing better but being that he was in the hospital she had a walk on this a lot more. The wound does not appear to be any better is also not really appearing to be significantly worse which is good news. There is no signs of active infection at this time. 10/14; patient with a small diabetic wound on the medial part of her left foot. We have been using silver collagen a total contact cast making good progress. I think the patient had a series of blisters on her dorsal foot probably secondary to having her legs recumbent for 3 days while in a conference in [place]. We wrapped her leg last week these are all healed. We did not previously have her in compression on the right leg. 05/29/2020 upon evaluation today patient appears to be doing well with regard to the wound on the plantar aspect of her foot medially. This is measuring smaller and looking much better than last time I saw her. Again when I did see her last was 2 weeks back and the wound was significantly larger. I do believe the cast is helping and I believe the collagen is a good option for her. 06/05/2020 on evaluation today patient appears to be doing well with regard to her foot ulcer this is actually measuring significantly better and overall I feel like she is doing excellent. There is no signs of active infection at  this time. 06/12/2020 upon evaluation today patient actually continues to show signs of good improvement which is excellent news. I am extremely pleased with how  she seems to be progressing at this point in regard to her wound. There is still some depth to the wound but I do believe the collagen is helping her quite a bit. 06/19/2020 upon evaluation today patient appears to be doing well with regard to her plantar foot ulcer. She is actually making excellent progress and in fact this appears to be almost completely healed. With that being said I do believe that the patient is going to actually require 1 more week in the cast although after that I am hopeful she will be ready for discharge. 06/26/2020 on evaluation today patient appears to be doing well in regard to her wound currently. Fortunately there is no signs of active infection in general I feel like she is doing excellent. This appears to be completely healed I think she is ready to come out of the cast. 11/29; patient comes back in the clinic today with a very quick reopening in the exact same area on the medial plantar foot. She had been healed out last time. She went back into some new balance shoes that she got at [employer] with a custom insert. As it turns out this wound also happened when wearing these shoes although there was some modification made I think with the wound initially happened they added some foam around the wound area. This obviously is not going to be sufficient. 07/17/2020 on evaluation today patient appears to be doing well with regard to her wound. Fortunately she seems to be making good progress. Unfortunately she was in the hospital due to a issue with colitis and had just been discharged today in fact. She tells me that her biggest concern here is that a lot of her numbers especially her creatinine were somewhat elevated and problematic. She is can be following up with her provider in order to have a further work-up  at this time. With that being said she did need to come back in for a cast change. She did not allow them to remove the cast due to the fact that she did not feel like that was the issue whatsoever and indeed it does not appear that was the case her wound appears to be doing excellent today. 07/24/2020 upon evaluation today patient appears to be doing well in regard to her foot ulcer. She still has a small opening but this is showing signs of excellent improvement overall but they were very close to complete resolution. No fevers, chills, nausea, vomiting, or diarrhea. 07/31/2020 upon evaluation today patient actually appears to be doing excellent she is actually completely healed this is great news. Fortunately there is no signs of active infection at this time. No fevers, chills, nausea, vomiting, or diarrhea. The patient tells me that she did see Dr. Paulla Dolly in order to get to [place] so that she can have a custom shoe made. She saw him earlier last week. She does have an appointment with Liliane Channel on the sixth I believe on January she tells me Readmission: 08/28/2020 on evaluation today patient appears to be doing poorly in regard to her wound currently. She tells me this has reopened. The good news that she did get measured for and actually her shoes are on the way in from [facility].. With that being said she has been trying to stay off of this is much as possible using her wheelchair around home. Nonetheless has been somewhat difficult. The good news is her shoes should be here next week 09/04/2020 upon evaluation today patient  appears to be doing well with regard to her wound. There is a little bit of callus buildup but nothing too significant she does tell me she was very active this week. Fortunately there is no signs of systemic infection at this point. The dorsal foot wound actually appears to be doing better. I think this is healed. 09/11/2020 upon evaluation today patient appears to actually be  doing quite well in my opinion based on what I am seeing today. Fortunately there is no signs of infection in fact her mother is certain that this is even open any longer based on what I see. Fortunately I think the patient has been doing everything she can to try to keep this under control. 09/18/2020 upon evaluation today patient appears to be doing about the same in regard to her foot ulcer. She has been tolerating the dressing changes without complication. Fortunately there is no sign of active infection at this time. No fevers, chills, nausea, vomiting, or diarrhea. She is going require some sharp debridement today. 09/25/2020 upon evaluation today patient appears to be doing well with regard to his wounds she has been tolerating the dressing changes without complication. Her wound appears to be completely healed based on what I am seeing at this point. There does not appear to be any signs of active infection at this time which is great news. No fevers, chills, nausea, vomiting, or diarrhea. She did get the cushion for her foot as well that she ordered to try to help keep pressure off of this area. That looks like it may be very beneficial for her to be honest. 10/02/2020 upon evaluation today patient appears to be doing more poorly in regard to her foot ulcer. Unfortunately this has reopened since we saw her last week. It apparently did not take too long at all for this to happen. She is obviously somewhat disappointed as she was hopeful that that would be time this thing would stay closed. Fortunately there is no evidence of active infection at this point. No fevers, chills, nausea, vomiting, or diarrhea. With that being said I am good have to perform a little bit of sharp debridement clear away some of the debris currently. Including a minimal amount of callus at this time. 10/16/2020 upon evaluation today patient appears to be doing about the same in regard to her foot ulcer unfortunately. There  does not appear to be any signs of active infection which is great news. No fever chills noted she has been tolerating the dressing changes without complication which is great news. Unfortunately she tells me that she has been very depressed about the situation is getting very frustrating to her that she continues to have issues despite everything that she is trying to do to stay off of her foot. She is extremely discouraged and I do hate to hear this. Obviously we have been hopeful that if she got her shoes that would make a difference she is actually can be getting those tomorrow but I am not certain that that alone is good to be enough to get this to heal. We may end up having to consider going back into a total contact cast to get this to heal and then subsequently once we get her healed get her into her new diabetic shoes which will and my hope anyway keep this from reopening again. 10/23/2020 upon evaluation today patient appears to be doing a little worse both in regard to the size of her wound as well as  in regard to the fact that she has erythema surrounding the wound and wrap around the lateral part of her foot where she tells me has been somewhat sore. Fortunately there does not appear to be any evidence of infection systemically but locally definitely there is erythema and warmth consistent with local cellulitis. I am glad to remove some of the callus as well. 10/30/2020 upon evaluation today patient appears to be doing well with regard to her wound on the plantar aspect of her foot. This is significantly improved compared to last week. 10/30/2020 upon evaluation today patient actually is making excellent progress her wound appears to show signs of great improvement which is wonderful and that she is extremely pleased to hear this. She actually leaves Sunday to go on her trip with her half sister and friends. 11/13/2020 on evaluation today patient's wound actually appears to be doing quite  well. There does not appear to be any signs of infection it has been 2 weeks since have seen her she does have a little bit of callus buildup here today but at the same time I do not believe the vacation time set her back any just has not really made a lot of significant improvement. She is done with her antibiotics at this point. 11/20/2020 upon evaluation today patient appears to be doing poorly in regard to her foot. In fact this appears to be showing signs of Infection. She has erythema and warmth that is concerning. I know she is very discouraged as this seems to be a recurrent issue. I think we may need to delve further into the possibility of something deeper going on here as far as a structural infection. 11/27/2020 patient presents for 1 week follow-up. She had a culture of her left foot ulcer that grew staph aureus sensitive to Augmentin. This was called in by [place], [place] this morning. She is currently on doxycycline for previous culture result. Patient also had an x-ray done of her left foot that had conflicting results. We asked for clarification and was told we would have clarification on Monday. Patient states overall she is doing well. She tries to stay off of her foot is much as possible. 12/04/2020 patient presents for 1 week follow-up. She is currently taking Augmentin and tolerating this well. It was confirmed that her x-ray had no acute osseous abnormalities. We switched her to collagen from silver alginate last week. Patient is overall doing well and trying to stay off her foot is much as possible. She has no complaints today. 12/11/2020 upon evaluation today patient appears to be doing well with regard to her wound. She has been tolerating the dressing changes without complication. Fortunately there is no signs of active infection noted at this point. I think she is definitely ready to go back into the cast. Objective Constitutional Well-nourished and well-hydrated in no acute  distress. Vitals Time Taken: 8:23 AM, Height: 66 in, Weight: 245 lbs, BMI: 39.5, Temperature: 98.2 F, Pulse: 74 bpm, Respiratory Rate: 17 breaths/min, Blood Pressure: 142/74 mmHg. Respiratory normal breathing without difficulty. Psychiatric this patient is able to make decisions and demonstrates good insight into disease process. Alert and Oriented x 3. pleasant and cooperative. General Notes: Patient's wound bed showed signs of good granulation epithelization there was some slough noted and minimal amount of callus I did perform sharp debridement to clear away some of the necrotic debris here and she tolerated that without complication. Post debridement wound bed appears to be doing much better. I do  think the cast is appropriate and therefore we did go ahead and reapply the total contact cast today. Integumentary (Hair, Skin) Wound #1RRR status is Open. Original cause of wound was Gradually Appeared. The date acquired was: 05/11/2019. The wound has been in treatment 47 weeks. The wound is located on the [place]. The wound measures 0.8cm length x 0.7cm width x 0.2cm depth; 0.44cm^2 area and 0.088cm^3 volume. There is Fat Layer (Subcutaneous Tissue) exposed. There is no tunneling or undermining noted. There is a medium amount of serosanguineous drainage noted. The wound margin is thickened. There is large (67-100%) red, pink granulation within the wound bed. There is a small (1-33%) amount of necrotic tissue within the wound bed including Adherent Slough. Assessment Active Problems ICD-10 Type 2 diabetes mellitus with foot ulcer Non-pressure chronic ulcer of other part of left foot with fat layer exposed Charcot's joint, left ankle and foot Type 2 diabetes mellitus with diabetic neuropathy, unspecified Procedures Wound #1RRR Pre-procedure diagnosis of Wound #1RRR is a Diabetic Wound/Ulcer of the Lower Extremity located on the Left,Medial,Plantar Foot .Severity of Tissue  Pre Debridement is: Fat layer exposed. There was a Excisional Skin/Subcutaneous Tissue Debridement with a total area of 7.5 sq cm performed by Worthy Keeler, PA. With the following instrument(s): Curette to remove Viable and Non-Viable tissue/material. Material removed includes Callus, Subcutaneous Tissue, and Skin: Epidermis after achieving pain control using Other (benzocaine 20% spray). No specimens were taken. A time out was conducted at 09:05, prior to the start of the procedure. A Minimum amount of bleeding was controlled with Pressure. The procedure was tolerated well with a pain level of 0 throughout and a pain level of 0 following the procedure. Post Debridement Measurements: 0.8cm length x 0.7cm width x 0.2cm depth; 0.088cm^3 volume. Character of Wound/Ulcer Post Debridement is improved. Severity of Tissue Post Debridement is: Fat layer exposed. Post procedure Diagnosis Wound #1RRR: Same as Pre-Procedure Pre-procedure diagnosis of Wound #1RRR is a Diabetic Wound/Ulcer of the Lower Extremity located on the Left,Medial,Plantar Foot . There was a Total Contact Cast Procedure by Worthy Keeler, PA. Post procedure Diagnosis Wound #1RRR: Same as Pre-Procedure Plan Follow-up Appointments: Return Appointment in 1 week. - with Glynn Octave Shower/ Hygiene: May shower with protection but do not get wound dressing(s) wet. Off-Loading: T Contact Cast to Left Lower Extremity otal Additional Orders / Instructions: Follow Nutritious Diet The following medication(s) was prescribed: benzocaine topical 20 % aerosol aerosol topical for prior to debridement was prescribed at facility WOUND #1RRR: - Foot Wound Laterality: Plantar, Left, Medial Prim Dressing: KerraCel Ag Gelling Fiber Dressing, 4x5 in (silver alginate) Every Other Day/30 Days ary Discharge Instructions: Apply silver alginate to wound bed as instructed Secondary Dressing: Woven Gauze Sponge, Non-Sterile 4x4 in (Generic) Every Other  Day/30 Days Discharge Instructions: Apply over primary dressing as directed. Secondary Dressing: Optifoam Non-Adhesive Dressing, 4x4 in Every Other Day/30 Days Discharge Instructions: Apply over primary dressing cut to make foam donut Secured With: 23M [place] [facility] T ape, 2x2 (in/yd) (Generic) Every Other Day/30 Days Discharge Instructions: Secure dressing with tape as directed. 1. Would recommend currently that we going continue with the wound care measures as before and the patient is in agreement with the plan this includes the use of the silver alginate dressing. 2. I am also can recommend that we go ahead and apply the total contact cast which I did today. We will see how this does over the next week and I will reevaluate  at that time. 3. I am good to have her continue with the antibiotic I do want her to take it for the full month. Were also going to hold off on MRI right now as everything appears to be doing great in regard to her foot. We will see patient back for reevaluation in 1 week here in the clinic. If anything worsens or changes patient will contact our office for additional recommendations. Electronic Signature(s) Signed: 12/11/2020 9:14:22 AM By: Worthy Keeler PA-C Entered By: Worthy Keeler on 12/11/2020 09:14:22 -------------------------------------------------------------------------------- Total Contact Cast Details Patient Name: Date of Service: [name] [name] [name], [name] [name] 12/11/2020 8:15 A M Medical Record Number: [id number] Patient Account Number: [id number] Date of Birth/Sex: Treating RN: [birth year] (75 y.o. [name] [name] [name] Primary Care Provider: [name] Other Clinician: Referring Provider: Treating Provider/Extender: Adele Schilder in Treatment: [phone] T Contact Cast Applied for Wound Assessment: otal Wound #1RRR Left,Medial,Plantar Foot Performed By: Physician Worthy Keeler, PA Post Procedure Diagnosis Same as  Pre-procedure Electronic Signature(s) Signed: 12/11/2020 6:39:46 PM By: Worthy Keeler PA-C Signed: 12/11/2020 7:02:06 PM By: [name] RN, BSN Entered By: [name] on 12/11/2020 09:04:59 -------------------------------------------------------------------------------- SuperBill Details Patient Name: Date of Service: [name] [name] Guarneri [name] 12/11/2020 Medical Record Number: [id number] Patient Account Number: [id number] Date of Birth/Sex: Treating RN: Jul 31, 1946 (75 y.o. [name] [name] [name] Primary Care Provider: [name] Other Clinician: Referring Provider: Treating Provider/Extender: Adele Schilder in Treatment: 47 Diagnosis Coding ICD-10 Codes Code Description E11.621 Type 2 diabetes mellitus with foot ulcer L97.522 Non-pressure chronic ulcer of other part of left foot with fat layer exposed M14.672 Charcot's joint, left ankle and foot E11.40 Type 2 diabetes mellitus with diabetic neuropathy, unspecified Facility Procedures CPT4 Code: [id number] Description: [id number] - DEB SUBQ TISSUE 20 SQ CM/< ICD-10 Diagnosis Description L97.522 Non-pressure chronic ulcer of other part of left foot with fat layer exposed Modifier: Quantity: 1 Physician Procedures : CPT4 Code Description Modifier [id number] - WC PHYS SUBQ TISS 20 SQ CM ICD-10 Diagnosis Description L97.522 Non-pressure chronic ulcer of other part of left foot with fat layer exposed Quantity: 1 Electronic Signature(s) Signed: 12/11/2020 9:14:30 AM By: Worthy Keeler PA-C Entered By: Worthy Keeler on 12/11/2020 09:14:30

## 2020-12-13 ENCOUNTER — Encounter: Payer: Self-pay | Admitting: Internal Medicine

## 2020-12-13 NOTE — Progress Notes (Signed)
[name] [name], [name] [name] ([id number]) Visit Report for 11/27/2020 Arrival Information Details Patient Name: Date of Service: AAMINAH, FORRESTER RA E. 11/27/2020 1:00 PM Medical Record Number: [id number] Patient Account Number: [id number] Date of Birth/Sex: Treating RN: 07/02/1946 (75 y.o. Helene Shoe, Meta.Reding Primary Care Vada Swift: [name] Other Clinician: Referring Jadrien Narine: Treating Ryen Rhames/Extender: Mickle Asper in Treatment: 54 Visit Information History Since Last Visit All ordered tests and consults were completed: Yes Patient Arrived: Ambulatory Added or deleted any medications: No Arrival Time: 13:05 Any new allergies or adverse reactions: No Accompanied By: self Had a fall or experienced change in No Transfer Assistance: None activities of daily living that may affect Patient Identification Verified: Yes risk of falls: Secondary Verification Process Completed: Yes Signs or symptoms of abuse/neglect since last visito No Patient Requires Transmission-Based Precautions: No Hospitalized since last visit: No Patient Has Alerts: No Implantable device outside of the clinic excluding No cellular tissue based products placed in the center since last visit: Has Dressing in Place as Prescribed: Yes Pain Present Now: No Notes Per patient seen PCP HgbA1c 8. Electronic Signature(s) Signed: 11/27/2020 5:55:25 PM By: Deon Pilling Entered By: Deon Pilling on 11/27/2020 13:13:56 -------------------------------------------------------------------------------- Encounter Discharge Information Details Patient Name: Date of Service: [name] [name] RBA RA E. 11/27/2020 1:00 PM Medical Record Number: [id number] Patient Account Number: [id number] Date of Birth/Sex: Treating RN: 03-Mar-1946 (75 y.o. [name], Lauren Primary Care Chanel Mcadams: [name] Other Clinician: Referring Tiasha Helvie: Treating Josalyn Dettmann/Extender: Mickle Asper in Treatment: 57 Encounter  Discharge Information Items Post Procedure Vitals Discharge Condition: Stable Temperature (F): 98.8 Ambulatory Status: Ambulatory Pulse (bpm): 74 Discharge Destination: Home Respiratory Rate (breaths/min): 20 Transportation: Private Auto Blood Pressure (mmHg): 147/80 Accompanied By: self Schedule Follow-up Appointment: Yes Clinical Summary of Care: Patient Declined Electronic Signature(s) Signed: 12/13/2020 3:14:58 PM By: Rhae Hammock RN Entered By: Rhae Hammock on 11/27/2020 14:29:25 -------------------------------------------------------------------------------- Lower Extremity Assessment Details Patient Name: Date of Service: [name] [name], [name] RA E. 11/27/2020 1:00 PM Medical Record Number: [id number] Patient Account Number: [id number] Date of Birth/Sex: Treating RN: Jul 07, 1946 (75 y.o. [name] Primary Care Aminah Zabawa: [name] Other Clinician: Referring Tenesia Escudero: Treating Stalin Gruenberg/Extender: Lucile Crater Weeks in Treatment: 45 Edema Assessment Assessed: [Left: Yes] [Right: No] Edema: [Left: Ye] [Right: s] Calf Left: Right: Point of Measurement: 29 cm From Medial Instep 36.5 cm Ankle Left: Right: Point of Measurement: 12 cm From Medial Instep 24 cm Vascular Assessment Pulses: Dorsalis Pedis Palpable: [Left:Yes] Electronic Signature(s) Signed: 11/27/2020 5:55:25 PM By: Deon Pilling Entered By: Deon Pilling on 11/27/2020 13:14:16 -------------------------------------------------------------------------------- Multi Wound Chart Details Patient Name: Date of Service: [name] [name] 11/27/2020 1:00 PM Medical Record Number: [id number] Patient Account Number: [id number] Date of Birth/Sex: Treating RN: 1945-09-10 (75 y.o. [name] Primary Care Furkan Keenum: [name] Other Clinician: Referring Mckaylee Dimalanta: Treating Cayce Quezada/Extender: Mickle Asper in Treatment: 45 Vital Signs Height(in): 66 Pulse(bpm):  74 Weight(lbs): 245 Blood Pressure(mmHg): 147/80 Body Mass Index(BMI): 40 Temperature(F): 98.8 Respiratory Rate(breaths/min): 20 Photos: [1RRR:No Photos Left, Medial, Plantar Foot] [N/A:N/A N/A] Wound Location: [1RRR:Gradually Appeared] [N/A:N/A] Wounding Event: [1RRR:Diabetic Wound/Ulcer of the Lower] [N/A:N/A] Primary Etiology: [1RRR:Extremity Sleep Apnea, Deep Vein Thrombosis,] [N/A:N/A] Comorbid History: [1RRR:Hypertension, Colitis, Type II Diabetes, Gout, Osteoarthritis, Neuropathy 05/11/2019] [N/A:N/A] Date Acquired: [1RRR:45] [N/A:N/A] Weeks of Treatment: [1RRR:Open] [N/A:N/A] Wound Status: [1RRR:Yes] [N/A:N/A] Wound Recurrence: [1RRR:1.3x0.4x0.2] [N/A:N/A] Measurements L x W x D (cm) [1RRR:0.408] [N/A:N/A] A (cm) : rea [1RRR:0.082] [N/A:N/A] Volume (cm) : [1RRR:60.00%] [N/A:N/A] % Reduction  in A rea: [1RRR:79.90%] [N/A:N/A] % Reduction in Volume: [1RRR:Grade 2] [N/A:N/A] Classification: [1RRR:Medium] [N/A:N/A] Exudate A mount: [1RRR:Serosanguineous] [N/A:N/A] Exudate Type: [1RRR:red, brown] [N/A:N/A] Exudate Color: [1RRR:Thickened] [N/A:N/A] Wound Margin: [1RRR:Large (67-100%)] [N/A:N/A] Granulation A mount: [1RRR:Pink] [N/A:N/A] Granulation Quality: [1RRR:None Present (0%)] [N/A:N/A] Necrotic A mount: [1RRR:Fat Layer (Subcutaneous Tissue): Yes N/A] Exposed Structures: [1RRR:Fascia: No Tendon: No Muscle: No Joint: No Bone: No Large (67-100%)] [N/A:N/A] Epithelialization: [1RRR:Debridement - Excisional] [N/A:N/A] Debridement: Pre-procedure Verification/Time Out 14:10 [N/A:N/A] Taken: [1RRR:Lidocaine 4% Topical Solution] [N/A:N/A] Pain Control: [1RRR:Callus, Subcutaneous, Slough] [N/A:N/A] Tissue Debrided: [1RRR:Skin/Subcutaneous Tissue] [N/A:N/A] Level: [1RRR:9] [N/A:N/A] Debridement A (sq cm): [1RRR:rea Curette] [N/A:N/A] Instrument: [1RRR:Minimum] [N/A:N/A] Bleeding: [1RRR:Pressure] [N/A:N/A] Hemostasis A chieved: [1RRR:0] [N/A:N/A] Procedural Pain: [1RRR:0]  [N/A:N/A] Post Procedural Pain: [1RRR:Procedure was tolerated well] [N/A:N/A] Debridement Treatment Response: [1RRR:1.3x0.4x0.2] [N/A:N/A] Post Debridement Measurements L x W x D (cm) [1RRR:0.082] [N/A:N/A] Post Debridement Volume: (cm) [1RRR:callous to periwound. Redness has] [N/A:N/A] Assessment Notes: [1RRR:decreased at the marked line. Debridement] [N/A:N/A] Treatment Notes Wound #1RRR (Foot) Wound Laterality: Plantar, Left, Medial Cleanser Wound Cleanser Discharge Instruction: Cleanse the wound with wound cleanser prior to applying a clean dressing using gauze sponges, not tissue or cotton balls. Peri-Wound Care Topical Primary Dressing Promogran Prisma Matrix, 4.34 (sq in) (silver collagen) Discharge Instruction: Moisten collagen with saline or hydrogel Secondary Dressing Woven Gauze Sponges 2x2 in Discharge Instruction: Apply over primary dressing as directed. Optifoam Non-Adhesive Dressing, 4x4 in Discharge Instruction: Apply over primary dressing cut to make foam donut Secured With [employer] Sterile, 4.5x3.1 (in/yd) Discharge Instruction: Secure with Kerlix as directed. 71M Medipore H Soft Cloth Surgical T 4 x 2 (in/yd) ape Discharge Instruction: Secure dressing with tape as directed. Compression Wrap Compression Stockings Add-Ons Electronic Signature(s) Signed: 11/27/2020 5:05:14 PM By: Kalman Shan DO Signed: 11/27/2020 6:20:01 PM By: [name] RN, BSN Entered By: Kalman Shan on 11/27/2020 14:42:23 -------------------------------------------------------------------------------- Multi-Disciplinary Care Plan Details Patient Name: Date of Service: [name] [name] RBA RA E. 11/27/2020 1:00 PM Medical Record Number: [id number] Patient Account Number: [id number] Date of Birth/Sex: Treating RN: [birth year] (75 y.o. [name] Primary Care Chontel Warning: [name] Other Clinician: Referring Mcclellan Demarais: Treating Waqas Bruhl/Extender: Mickle Asper in Treatment: [place] reviewed with physician Active Inactive Nutrition Nursing Diagnoses: Impaired glucose control: actual or potential Potential for alteratiion in Nutrition/Potential for imbalanced nutrition Goals: Patient/caregiver verbalizes understanding of need to maintain therapeutic glucose control per primary care physician Date Initiated: 01/17/2020 Date Inactivated: 02/14/2020 Target Resolution Date: 02/14/2020 Goal Status: Met Patient/caregiver will maintain therapeutic glucose control Date Initiated: 01/17/2020 Target Resolution Date: 12/18/2020 Goal Status: Active Interventions: Assess HgA1c results as ordered upon admission and as needed Assess patient nutrition upon admission and as needed per policy Provide education on elevated blood sugars and impact on wound healing Treatment Activities: Patient referred to Primary Care Physician for further nutritional evaluation : 01/17/2020 Notes: Wound/Skin Impairment Nursing Diagnoses: Impaired tissue integrity Knowledge deficit related to ulceration/compromised skin integrity Goals: Patient/caregiver will verbalize understanding of skin care regimen Date Initiated: 01/17/2020 Target Resolution Date: 12/18/2020 Goal Status: Active Ulcer/skin breakdown will have a volume reduction of 30% by week 4 Date Initiated: 01/17/2020 Date Inactivated: 02/14/2020 Target Resolution Date: 02/14/2020 Goal Status: Met Ulcer/skin breakdown will have a volume reduction of 50% by week 8 Date Initiated: 02/14/2020 Date Inactivated: 03/13/2020 Target Resolution Date: 03/13/2020 Goal Status: Met Ulcer/skin breakdown will have a volume reduction of 80% by week 12 Date Initiated: 03/13/2020 Date Inactivated: 04/10/2020 Target Resolution Date: 04/10/2020 Goal Status: Unmet Unmet  Reason: infection Interventions: Assess patient/caregiver ability to obtain necessary supplies Assess patient/caregiver ability to perform ulcer/skin  care regimen upon admission and as needed Assess ulceration(s) every visit Provide education on ulcer and skin care Treatment Activities: Skin care regimen initiated : 01/17/2020 Topical wound management initiated : 01/17/2020 Notes: Electronic Signature(s) Signed: 11/27/2020 6:20:01 PM By: [name] RN, BSN Entered By: [name] on 11/27/2020 13:05:30 -------------------------------------------------------------------------------- Pain Assessment Details Patient Name: Date of Service: [name] [name] 11/27/2020 1:00 PM Medical Record Number: [id number] Patient Account Number: [id number] Date of Birth/Sex: Treating RN: 09-07-1945 (75 y.o. [name] Primary Care Moataz Tavis: [name] Other Clinician: Referring Marven Veley: Treating Ikran Patman/Extender: Mickle Asper in Treatment: 35 Active Problems Location of Pain Severity and Description of Pain Patient Has Paino No Site Locations Rate the pain. Current Pain Level: 0 Pain Management and Medication Current Pain Management: Medication: No Cold Application: No Rest: No Massage: No Activity: No T.[name]: No Heat Application: No Leg drop or elevation: No Is the Current Pain Management Adequate: Adequate How does your wound impact your activities of daily livingo Sleep: No Bathing: No Appetite: No Relationship With Others: No Bladder Continence: No Emotions: No Bowel Continence: No Work: No Toileting: No Drive: No Dressing: No Hobbies: No Electronic Signature(s) Signed: 11/27/2020 5:55:25 PM By: Deon Pilling Entered By: Deon Pilling on 11/27/2020 13:13:31 -------------------------------------------------------------------------------- Patient/Caregiver Education Details Patient Name: Date of Service: [name] Reason 4/20/2022andnbsp1:00 PM Medical Record Number: [id number] Patient Account Number: [id number] Date of Birth/Gender: Treating RN: Mar 31, 1946 (75 y.o. [name] Primary Care Physician: [name] Other Clinician: Referring Physician: Treating Physician/Extender: Mickle Asper in Treatment: 83 Education Assessment Education Provided To: Patient Education Topics Provided Infection: Methods: Explain/Verbal Responses: Reinforcements needed, State content correctly Offloading: Methods: Explain/Verbal Responses: Reinforcements needed, State content correctly Wound/Skin Impairment: Methods: Explain/Verbal Responses: Reinforcements needed, State content correctly Electronic Signature(s) Signed: 11/27/2020 6:20:01 PM By: [name] RN, BSN Entered By: [name] on 11/27/2020 13:05:59 -------------------------------------------------------------------------------- Wound Assessment Details Patient Name: Date of Service: [name] [name] 11/27/2020 1:00 PM Medical Record Number: [id number] Patient Account Number: [id number] Date of Birth/Sex: Treating RN: [birth year] (75 y.o. Helene Shoe, Tammi Klippel Primary Care Keymoni Mccaster: [name] Other Clinician: Referring Rushie Brazel: Treating Aritzel Krusemark/Extender: Lucile Crater Weeks in Treatment: 45 Wound Status Wound Number: 1RRR Primary Diabetic Wound/Ulcer of the Lower Extremity Etiology: Wound Location: Left, Medial, Plantar Foot Wound Open Wounding Event: Gradually Appeared Status: Date Acquired: 05/11/2019 Comorbid Sleep Apnea, Deep Vein Thrombosis, Hypertension, Colitis, Type Weeks Of Treatment: 45 History: II Diabetes, Gout, Osteoarthritis, Neuropathy Clustered Wound: No Photos Wound Measurements Length: (cm) 1.3 Width: (cm) 0.4 Depth: (cm) 0.2 Area: (cm) 0.408 Volume: (cm) 0.082 % Reduction in Area: 60% % Reduction in Volume: 79.9% Epithelialization: Large (67-100%) Tunneling: No Undermining: No Wound Description Classification: Grade 2 Wound Margin: Thickened Exudate Amount: Medium Exudate Type:  Serosanguineous Exudate Color: red, brown Foul Odor After Cleansing: No Slough/Fibrino No Wound Bed Granulation Amount: Large (67-100%) Exposed Structure Granulation Quality: Pink Fascia Exposed: No Necrotic Amount: None Present (0%) Fat Layer (Subcutaneous Tissue) Exposed: Yes Tendon Exposed: No Muscle Exposed: No Joint Exposed: No Bone Exposed: No Assessment Notes callous to periwound. Redness has decreased at the marked line. Electronic Signature(s) Signed: 11/27/2020 4:38:38 PM By: Sandre Kitty Signed: 11/27/2020 5:55:25 PM By: Deon Pilling Entered By: Sandre Kitty on 11/27/2020 16:13:04 -------------------------------------------------------------------------------- Vitals Details Patient Name: Date of Service: [place], [place] RBA RA E. 11/27/2020 1:00 PM  Medical Record Number: [id number] Patient Account Number: [id number] Date of Birth/Sex: Treating RN: Aug 22, 1945 (75 y.o. Helene Shoe, Tammi Klippel Primary Care Ilya Ess: [name] Other Clinician: Referring Juston Goheen: Treating Nettie Cromwell/Extender: Mickle Asper in Treatment: 45 Vital Signs Time Taken: 13:06 Temperature (F): 98.8 Height (in): 66 Pulse (bpm): 74 Weight (lbs): 245 Respiratory Rate (breaths/min): 20 Body Mass Index (BMI): 39.5 Blood Pressure (mmHg): 147/80 Reference Range: 80 - 120 mg / dl Electronic Signature(s) Signed: 11/27/2020 5:55:25 PM By: Deon Pilling Entered By: Deon Pilling on 11/27/2020 13:13:23

## 2020-12-13 NOTE — Progress Notes (Signed)
[name] [name], [name] [name] ([id number]) Visit Report for 12/11/2020 Arrival Information Details Patient Name: Date of Service: [name] [name], [name] E. 12/11/2020 8:15 A M Medical Record Number: [id number] Patient Account Number: [id number] Date of Birth/Sex: Treating RN: 04/19/1946 (75 y.o. [name], Lauren Primary Care Micayla Brathwaite: [name] Other Clinician: Referring Zohal Reny: Treating Freemon Binford/Extender: Adele Schilder in Treatment: 24 Visit Information History Since Last Visit Added or deleted any medications: No Patient Arrived: Ambulatory Any new allergies or adverse reactions: No Arrival Time: 08:21 Had a fall or experienced change in No Accompanied By: self activities of daily living that may affect Transfer Assistance: None risk of falls: Patient Identification Verified: Yes Signs or symptoms of abuse/neglect since last visito No Secondary Verification Process Completed: Yes Hospitalized since last visit: No Patient Requires Transmission-Based Precautions: No Implantable device outside of the clinic excluding No Patient Has Alerts: No cellular tissue based products placed in the center since last visit: Has Dressing in Place as Prescribed: Yes Pain Present Now: No Electronic Signature(s) Signed: 12/13/2020 3:14:58 PM By: Rhae Hammock RN Entered By: Rhae Hammock on 12/11/2020 08:23:02 -------------------------------------------------------------------------------- Encounter Discharge Information Details Patient Name: Date of Service: [name] [name], [name] RA E. 12/11/2020 8:15 A M Medical Record Number: [id number] Patient Account Number: [id number] Date of Birth/Sex: Treating RN: [birth year] (75 y.o. [name], Lauren Primary Care Tyrina Hines: [name] Other Clinician: Referring Brennan Litzinger: Treating Harvard Zeiss/Extender: Adele Schilder in Treatment: 63 Encounter Discharge Information Items Post Procedure Vitals Discharge Condition:  Stable Temperature (F): 98.4 Ambulatory Status: Wheelchair Pulse (bpm): 72 Discharge Destination: Home Respiratory Rate (breaths/min): 17 Transportation: Private Auto Blood Pressure (mmHg): 142/74 Accompanied By: self Schedule Follow-up Appointment: Yes Clinical Summary of Care: Patient Declined Electronic Signature(s) Signed: 12/13/2020 3:14:58 PM By: Rhae Hammock RN Entered By: Rhae Hammock on 12/11/2020 09:40:09 -------------------------------------------------------------------------------- Lower Extremity Assessment Details Patient Name: Date of Service: [name] Amato RA E. 12/11/2020 8:15 A M Medical Record Number: [id number] Patient Account Number: [id number] Date of Birth/Sex: Treating RN: 1945/08/17 (75 y.o. [name], Lauren Primary Care Payden Docter: [name] Other Clinician: Referring Jowanna Loeffler: Treating Zuzu Befort/Extender: Landis Martins Weeks in Treatment: 47 Edema Assessment Assessed: [Left: Yes] [Right: No] Edema: [Left: Ye] [Right: s] Calf Left: Right: Point of Measurement: 29 cm From Medial Instep 38.5 cm Ankle Left: Right: Point of Measurement: 12 cm From Medial Instep 23 cm Vascular Assessment Pulses: Dorsalis Pedis Palpable: [Left:Yes] Posterior Tibial Palpable: [Left:Yes] Electronic Signature(s) Signed: 12/13/2020 3:14:58 PM By: Rhae Hammock RN Entered By: Rhae Hammock on 12/11/2020 08:26:01 -------------------------------------------------------------------------------- Multi-Disciplinary Care Plan Details Patient Name: Date of Service: [name] Amato RA E. 12/11/2020 8:15 A M Medical Record Number: [id number] Patient Account Number: [id number] Date of Birth/Sex: Treating RN: Jun 15, 1946 (75 y.o. [name] Primary Care Quanell Loughney: [name] Other Clinician: Referring Chala Gul: Treating Deetya Drouillard/Extender: Adele Schilder in Treatment: [place] reviewed with  physician Active Inactive Nutrition Nursing Diagnoses: Impaired glucose control: actual or potential Potential for alteratiion in Nutrition/Potential for imbalanced nutrition Goals: Patient/caregiver verbalizes understanding of need to maintain therapeutic glucose control per primary care physician Date Initiated: 01/17/2020 Date Inactivated: 02/14/2020 Target Resolution Date: 02/14/2020 Goal Status: Met Patient/caregiver will maintain therapeutic glucose control Date Initiated: 01/17/2020 Target Resolution Date: 12/18/2020 Goal Status: Active Interventions: Assess HgA1c results as ordered upon admission and as needed Assess patient nutrition upon admission and as needed per policy Provide education on elevated blood sugars and impact on wound healing Treatment Activities:  Patient referred to Primary Care Physician for further nutritional evaluation : 01/17/2020 Notes: Wound/Skin Impairment Nursing Diagnoses: Impaired tissue integrity Knowledge deficit related to ulceration/compromised skin integrity Goals: Patient/caregiver will verbalize understanding of skin care regimen Date Initiated: 01/17/2020 Target Resolution Date: 12/18/2020 Goal Status: Active Ulcer/skin breakdown will have a volume reduction of 30% by week 4 Date Initiated: 01/17/2020 Date Inactivated: 02/14/2020 Target Resolution Date: 02/14/2020 Goal Status: Met Ulcer/skin breakdown will have a volume reduction of 50% by week 8 Date Initiated: 02/14/2020 Date Inactivated: 03/13/2020 Target Resolution Date: 03/13/2020 Goal Status: Met Ulcer/skin breakdown will have a volume reduction of 80% by week 12 Date Initiated: 03/13/2020 Date Inactivated: 04/10/2020 Target Resolution Date: 04/10/2020 Goal Status: Unmet Unmet Reason: infection Interventions: Assess patient/caregiver ability to obtain necessary supplies Assess patient/caregiver ability to perform ulcer/skin care regimen upon admission and as needed Assess ulceration(s) every  visit Provide education on ulcer and skin care Treatment Activities: Skin care regimen initiated : 01/17/2020 Topical wound management initiated : 01/17/2020 Notes: Electronic Signature(s) Signed: 12/11/2020 7:02:06 PM By: Boehlein, [name] RN, BSN Entered By: Boehlein, [name] on 12/11/2020 09:03:21 -------------------------------------------------------------------------------- Pain Assessment Details Patient Name: Date of Service: [name] [name], [name] RA E. 12/11/2020 8:15 A M Medical Record Number: [id number] Patient Account Number: [id number] Date of Birth/Sex: Treating RN: 02/06/1946 (74 y.o. F) Breedlove, Lauren Primary Care : Burns, Stacy Other Clinician: Referring : Treating /Extender: Stone III, Hoyt Burns, Stacy Weeks in Treatment: 47 Active Problems Location of Pain Severity and Description of Pain Patient Has Paino No Site Locations Rate the pain. Rate the pain. Current Pain Level: 0 Pain Management and Medication Current Pain Management: Electronic Signature(s) Signed: 12/13/2020 3:14:58 PM By: Breedlove, Lauren RN Entered By: Breedlove, Lauren on 12/11/2020 08:24:24 -------------------------------------------------------------------------------- Patient/Caregiver Education Details Patient Name: Date of Service: [name] [name], [name] RA E. 5/4/2022andnbsp8:15 A M Medical Record Number: [id number] Patient Account Number: [id number] Date of Birth/Gender: Treating RN: 01/23/1946 (74 y.o. F) Boehlein, [name] Primary Care Physician: Burns, Stacy Other Clinician: Referring Physician: Treating Physician/Extender: Stone III, Hoyt Burns, Stacy Weeks in Treatment: 47 Education Assessment Education Provided To: Patient Education Topics Provided Elevated Blood Sugar/ Impact on Healing: Methods: Explain/Verbal Responses: Reinforcements needed, State content correctly Offloading: Methods: Explain/Verbal Responses: Reinforcements needed, State content  correctly Wound/Skin Impairment: Methods: Explain/Verbal Responses: Reinforcements needed, State content correctly Electronic Signature(s) Signed: 12/11/2020 7:02:06 PM By: Boehlein, [name] RN, BSN Entered By: Boehlein, [name] on 12/11/2020 09:03:52 -------------------------------------------------------------------------------- Wound Assessment Details Patient Name: Date of Service: [name] [name], [name] RA E. 12/11/2020 8:15 A M Medical Record Number: [id number] Patient Account Number: [id number] Date of Birth/Sex: Treating RN: 08/26/1945 (74 y.o. F) Breedlove, Lauren Primary Care : Burns, Stacy Other Clinician: Referring : Treating /Extender: Stone III, Hoyt Burns, Stacy Weeks in Treatment: 47 Wound Status Wound Number: [id number] Primary Diabetic Wound/Ulcer of the Lower Extremity Etiology: Wound Location: Left, Medial, Plantar Foot Wound Open Wounding Event: Gradually Appeared Status: Date Acquired: 05/11/2019 Comorbid Sleep Apnea, Deep Vein Thrombosis, Hypertension, Colitis, Type Weeks Of Treatment: 47 History: II Diabetes, Gout, Osteoarthritis, Neuropathy Clustered Wound: No Photos Wound Measurements Length: (cm) 0.8 Width: (cm) 0.7 Depth: (cm) 0.2 Area: (cm) 0.44 Volume: (cm) 0.088 % Reduction in Area: 56.9% % Reduction in Volume: 78.4% Epithelialization: Medium (34-66%) Tunneling: No Undermining: No Wound Description Classification: Grade 2 Wound Margin: Thickened Exudate Amount: Medium Exudate Type: Serosanguineous Exudate Color: red, brown Foul Odor After Cleansing: No Slough/Fibrino Yes Wound Bed Granulation Amount: Large (67-100%) Exposed Structure Granulation Quality: Red, Pink Fascia Exposed: No Necrotic Amount: Small (  1-33%) Fat Layer (Subcutaneous Tissue) Exposed: Yes Necrotic Quality: Adherent Slough Tendon Exposed: No Muscle Exposed: No Joint Exposed: No Bone Exposed: No Treatment Notes Wound #[id number] (Foot) Wound Laterality:  Plantar, Left, Medial Cleanser Peri-Wound Care Topical Primary Dressing KerraCel Ag Gelling Fiber Dressing, 4x5 in (silver alginate) Discharge Instruction: Apply silver alginate to wound bed as instructed Secondary Dressing Woven Gauze Sponge, Non-Sterile 4x4 in Discharge Instruction: Apply over primary dressing as directed. Optifoam Non-Adhesive Dressing, 4x4 in Discharge Instruction: Apply over primary dressing cut to make foam donut Secured With 3M Medipore H Soft Cloth Surgical T ape, 2x2 (in/yd) Discharge Instruction: Secure dressing with tape as directed. Compression Wrap Compression Stockings Add-Ons Electronic Signature(s) Signed: 12/11/2020 4:55:09 PM By: Dawkins, Destiny Signed: 12/13/2020 3:14:58 PM By: Breedlove, Lauren RN Entered By: Dawkins, Destiny on 12/11/2020 16:35:47 -------------------------------------------------------------------------------- Vitals Details Patient Name: Date of Service: [name] [name], [name] RA E. 12/11/2020 8:15 A M Medical Record Number: [id number] Patient Account Number: [id number] Date of Birth/Sex: Treating RN: 08/15/1945 (74 y.o. F) Breedlove, Lauren Primary Care : Burns, Stacy Other Clinician: Referring : Treating /Extender: Stone III, Hoyt Burns, Stacy Weeks in Treatment: 47 Vital Signs Time Taken: 08:23 Temperature (°F): 98.2 Height (in): 66 Pulse (bpm): 74 Weight (lbs): 245 Respiratory Rate (breaths/min): 17 Body Mass Index (BMI): 39.5 Blood Pressure (mmHg): 142/74 Reference Range: 80 - 120 mg / dl Electronic Signature(s) Signed: 12/13/2020 3:14:58 PM By: Breedlove, Lauren RN Entered By: Breedlove, Lauren on 12/11/2020 08:24:16 

## 2020-12-17 NOTE — Progress Notes (Signed)
[name] [name], [name] [name] ([id number]) Visit Report for 11/27/2020 Chief Complaint Document Details Patient Name: Date of Service: [name], [name] 11/27/2020 1:00 PM Medical Record Number: [id number] Patient Account Number: [id number] Date of Birth/Sex: Treating RN: 1945-11-30 (75 y.o. [name] [name] Primary Care Provider: [name] Other Clinician: Referring Provider: Treating Provider/Extender: Mickle Asper in Treatment: 49 Information Obtained from: Patient Chief Complaint Left foot ulcer Electronic Signature(s) Signed: 11/27/2020 5:05:14 PM By: Kalman Shan DO Entered By: Kalman Shan on 11/27/2020 14:42:30 -------------------------------------------------------------------------------- Debridement Details Patient Name: Date of Service: [name] Guarneri RBA RA E. 11/27/2020 1:00 PM Medical Record Number: [id number] Patient Account Number: [id number] Date of Birth/Sex: Treating RN: 1946-07-29 (75 y.o. [name] [name] Primary Care Provider: [name] Other Clinician: Referring Provider: Treating Provider/Extender: Mickle Asper in Treatment: 45 Debridement Performed for Assessment: Wound #[id number] Left,Medial,Plantar Foot Performed By: Physician Kalman Shan, DO Debridement Type: Debridement Severity of Tissue Pre Debridement: Fat layer exposed Level of Consciousness (Pre-procedure): Awake and Alert Pre-procedure Verification/Time Out Yes - 14:10 Taken: Start Time: 14:11 Pain Control: Lidocaine 4% T opical Solution T Area Debrided (L x W): otal 3 (cm) x 3 (cm) = 9 (cm) Tissue and other material debrided: Viable, Non-Viable, Callus, Slough, Subcutaneous, Skin: Epidermis, Slough Level: Skin/Subcutaneous Tissue Debridement Description: Excisional Instrument: Curette Bleeding: Minimum Hemostasis Achieved: Pressure End Time: 14:16 Procedural Pain: 0 Post Procedural Pain: 0 Response to Treatment: Procedure was tolerated  well Level of Consciousness (Post- Awake and Alert procedure): Post Debridement Measurements of Total Wound Length: (cm) 1.3 Width: (cm) 0.4 Depth: (cm) 0.2 Volume: (cm) 0.082 Character of Wound/Ulcer Post Debridement: Improved Severity of Tissue Post Debridement: Fat layer exposed Post Procedure Diagnosis Same as Pre-procedure Electronic Signature(s) Signed: 11/27/2020 5:05:14 PM By: Kalman Shan DO Signed: 11/27/2020 6:20:01 PM By: [name] RN, BSN Entered By: [name] on 11/27/2020 14:17:39 -------------------------------------------------------------------------------- HPI Details Patient Name: Date of Service: [name], [name] 11/27/2020 1:00 PM Medical Record Number: [id number] Patient Account Number: [id number] Date of Birth/Sex: Treating RN: 1945-11-10 (75 y.o. [name] [name] Primary Care Provider: [name] Other Clinician: Referring Provider: Treating Provider/Extender: Mickle Asper in Treatment: 60 History of Present Illness HPI Description: 01/17/2020 upon evaluation today patient presents for initial evaluation here in our clinic concerning issues she has been having with a left medial/plantar foot ulcer. This is actually been an issue for her since October 2020. She has been seeing Dr. Doran Durand for quite some time during that course. Fortunately there is no signs of active infection at this time. Or least no mention of this to have seen in general. With that being said unfortunately I do see some signs of erythema noted today that does have me concerned about the possibility of infection at this point in the surrounding area of the wound. There is also a warm to touch at the site which is somewhat concerning. Fortunately there is no evidence of systemic infection which is great news. The patient does have a history of diabetes mellitus type 2, Charcot foot which is what led to the wound, and hypertension. She notes that she was  in a cast for some time with Dr. Doran Durand for about 8 weeks. During that time they were utilizing according to the patient silver nitrate along with a foam doughnut and then Coban to secure in place in the cast in place. With that being said I do not have the actual records to review we are going  to try to get a hold of those unfortunately they would not flow over into care everywhere I did look today. She has been seeing Dr. Doran Durand and his physician assistant Larkin Ina up until the end of May and apparently is still seeing them on a regular basis every 2 weeks roughly. She has also tried Iodosorb without effect here. 01/24/2020 upon evaluation today patient actually appears to be doing quite well with regard to her wounds. She has been tolerating the dressing changes without complication. Fortunately there is no signs of active infection spreading which is good news. Her culture did show signs of Staph aureus I did place her on Augmentin due to the erythema surrounding the wound. With that being said the wound does appear to be doing better she has her longer walking cast/boot and I think that is actually good for her for the time being. I am considering reinitiating total contact cast when she gets back from vacation but next week she will actually be out of town at [employer] she knows not to get in the water but she still obviously is planning to enjoy herself she is going to take it easy on her foot however. 02/07/2020 upon evaluation today patient appears to be doing fairly well in regard to her ulcer on her foot. Fortunately there is no signs of severe infection at this time which is great news and overall very pleased in that regard. With that being said I do think that she could still benefit from a total contact cast. Nonetheless she is using her walking boot which at least provide some protection and that it prevents some of the friction occurring when she is ambulating. 02/14/2020 upon evaluation  today patient appears to be doing well with regard to her foot ulcer. This is actually measuring a little bit smaller yet again this week. Overall very pleased with where things stand and I do not see any signs of active infection at this time which is also good news. Since she is measuring better the patient has wanting to somewhat hold off on proceeding with the total contact cast which I think is reasonable at this point. 02/28/2020 on evaluation today patient appears to be doing well in general in regard to her wound although she has a lot of callus buildup as compared to last time I saw her. This is can require sharp debridement today. I do believe she really needs the total contact cast as well which we have discussed previous. 7/23; patient comes in for a total contact cast change 03/06/2020 on evaluation today patient appears to be doing quite well with regard to her wounds. Fortunately the wound bed is measuring smaller and looking much better there is little callus noted although there is some debridement necessary today. 03/13/2020 on evaluation today patient's wound actually appears to be doing excellent which is great news. With that being said unfortunately she is having some issues currently with her left leg where she does have cellulitis it appears. This may have come from an area that rubbed underneath the cast from last week that we noted we padded that area and it looks to be doing excellent at this point but nonetheless the leg was somewhat painful, swollen, and somewhat erythematous. She also had an elevated white blood cell count of 11.5 based on what I saw on looking at her records from the med center in [facility] from where she was seen yesterday. Unfortunately with [place] having a provider on vacation  there was no one here in the clinic in the afternoon when she called therefore she went to the ER as advised. Subsequently they did not cut off the cast as they did not have anyone from  orthopedics there to do so and subsequently also did not have the ability to do the Doppler for evaluation of DVT They recommended therefore given her dose of Eliquis as well as . Augmentin and sent her home to come see [place] today to have the cast taken off and then she is supposed to go back to have the study for DVT performed they are following. 03/20/2020 upon evaluation today patient appears to be doing well with regard to her foot all things considered we have not been able to use the total contact cast due to the infection that she had last week. She has been on the doxycycline and she had a 10-day supply of that I do believe that is helping and her leg appears to be doing better. With that being said there is fortunately no signs of active infection systemically at this time which is good news. No fevers, chills, nausea, vomiting, or diarrhea. 03/27/2020 upon evaluation today patient appears to be doing well with regard to her foot ulcer. There does not appear to be signs of active infection which is great news. Overall I am very pleased with where things stand at this point. 04/03/2020 upon evaluation today patient appears to be doing pretty well in regard to the overall appearance of her wound. Fortunately there is no signs of active infection at this time which is great news. No fevers, chills, nausea, vomiting, or diarrhea. With that being said she does have some blue-green drainage that actually is a little bit concerning to me for the possibility of Pseudomonas. I discussed that with the patient today. With that being said I do believe that we may be able to manage this however with the topical antibiotic cream as opposed to having to do anything oral especially since she seems to be doing so well with overall appearance of the wound. 04/10/2020 on evaluation today patient appears to be doing about the same roughly in regard to the overall size of her wound. With that being said  she fortunately has not shown any signs of worsening overall which is good news. I do believe that she is doing a great job trying to offload but again she may still do better with the cast. I do not see in the blue-green drainage that we noticed previously I do believe the gentamicin help in this regard. 04/17/2020 on evaluation today patient's wound appears to be doing about the same at this point. There is no significant improvement at this point. No fever chills noted. She is up for put the cast back on the day. That she states in a couple weeks she will need to have this off to go to a workshop. 04/24/2020 on evaluation today patient appears to be doing significantly better in regard to her wound. Fortunately there is no signs of active infection and overall feel like she is making great progress the cast seems to have done excellent for her. 05/01/2020 upon evaluation today patient presents for reevaluation she really does not appear to be doing too badly in regard to the actual wound on the left foot we have been managing. Unfortunately she has bilateral lower extremity edema with blisters between the webspace of her first and second toe on both feet. She has a tremendous amount  of edema in the legs which I think is where this is coming from it does not appear to be infected but nonetheless I do believe this is can be something that needs to be addressed today. Obviously this means we probably will not be putting the cast on at this point. She attributes this to the fact that she was sitting with her feet on the floor much longer during a conference last week she had a great time but unfortunately had a lot of complications as a result. 05/08/2020 upon evaluation today patient appears to be doing somewhat better in regard to her wounds at this time. Fortunately there is no signs of active infection which is great news. With that being said I do believe that the blisters have ruptured and  unfortunately did not just reattach I will remove some of the blistered tissue today. With that being said I do think the wound itself on the plantar aspect of left foot does need to have sharp debridement. 05/15/2020 upon evaluation today patient appears to be doing about the same in regard to her foot ulcer. Unfortunately in the past week her husband had a fall where he sustained a mild traumatic brain bleed. Fortunately he is doing better but being that he was in the hospital she had a walk on this a lot more. The wound does not appear to be any better is also not really appearing to be significantly worse which is good news. There is no signs of active infection at this time. 10/14; patient with a small diabetic wound on the medial part of her left foot. We have been using silver collagen a total contact cast making good progress. I think the patient had a series of blisters on her dorsal foot probably secondary to having her legs recumbent for 3 days while in a conference in [place]. We wrapped her leg last week these are all healed. We did not previously have her in compression on the right leg. 05/29/2020 upon evaluation today patient appears to be doing well with regard to the wound on the plantar aspect of her foot medially. This is measuring smaller and looking much better than last time I saw her. Again when I did see her last was 2 weeks back and the wound was significantly larger. I do believe the cast is helping and I believe the collagen is a good option for her. 06/05/2020 on evaluation today patient appears to be doing well with regard to her foot ulcer this is actually measuring significantly better and overall I feel like she is doing excellent. There is no signs of active infection at this time. 06/12/2020 upon evaluation today patient actually continues to show signs of good improvement which is excellent news. I am extremely pleased with how she seems to be progressing at this  point in regard to her wound. There is still some depth to the wound but I do believe the collagen is helping her quite a bit. 06/19/2020 upon evaluation today patient appears to be doing well with regard to her plantar foot ulcer. She is actually making excellent progress and in fact this appears to be almost completely healed. With that being said I do believe that the patient is going to actually require 1 more week in the cast although after that I am hopeful she will be ready for discharge. 06/26/2020 on evaluation today patient appears to be doing well in regard to her wound currently. Fortunately there is no signs of active  infection in general I feel like she is doing excellent. This appears to be completely healed I think she is ready to come out of the cast. 11/29; patient comes back in the clinic today with a very quick reopening in the exact same area on the medial plantar foot. She had been healed out last time. She went back into some new balance shoes that she got at [employer] with a custom insert. As it turns out this wound also happened when wearing these shoes although there was some modification made I think with the wound initially happened they added some foam around the wound area. This obviously is not going to be sufficient. 07/17/2020 on evaluation today patient appears to be doing well with regard to her wound. Fortunately she seems to be making good progress. Unfortunately she was in the hospital due to a issue with colitis and had just been discharged today in fact. She tells me that her biggest concern here is that a lot of her numbers especially her creatinine were somewhat elevated and problematic. She is can be following up with her provider in order to have a further work-up at this time. With that being said she did need to come back in for a cast change. She did not allow them to remove the cast due to the fact that she did not feel like that was the issue whatsoever and  indeed it does not appear that was the case her wound appears to be doing excellent today. 07/24/2020 upon evaluation today patient appears to be doing well in regard to her foot ulcer. She still has a small opening but this is showing signs of excellent improvement overall but they were very close to complete resolution. No fevers, chills, nausea, vomiting, or diarrhea. 07/31/2020 upon evaluation today patient actually appears to be doing excellent she is actually completely healed this is great news. Fortunately there is no signs of active infection at this time. No fevers, chills, nausea, vomiting, or diarrhea. The patient tells me that she did see Dr. Paulla Dolly in order to get to [facility] so that she can have a custom shoe made. She saw him earlier last week. She does have an appointment with Liliane Channel on the sixth I believe on January she tells me Readmission: 08/28/2020 on evaluation today patient appears to be doing poorly in regard to her wound currently. She tells me this has reopened. The good news that she did get measured for and actually her shoes are on the way in from [facility].. With that being said she has been trying to stay off of this is much as possible using her wheelchair around home. Nonetheless has been somewhat difficult. The good news is her shoes should be here next week 09/04/2020 upon evaluation today patient appears to be doing well with regard to her wound. There is a little bit of callus buildup but nothing too significant she does tell me she was very active this week. Fortunately there is no signs of systemic infection at this point. The dorsal foot wound actually appears to be doing better. I think this is healed. 09/11/2020 upon evaluation today patient appears to actually be doing quite well in my opinion based on what I am seeing today. Fortunately there is no signs of infection in fact her mother is certain that this is even open any longer based on what I see. Fortunately  I think the patient has been doing everything she can to try to keep this  under control. 09/18/2020 upon evaluation today patient appears to be doing about the same in regard to her foot ulcer. She has been tolerating the dressing changes without complication. Fortunately there is no sign of active infection at this time. No fevers, chills, nausea, vomiting, or diarrhea. She is going require some sharp debridement today. 09/25/2020 upon evaluation today patient appears to be doing well with regard to his wounds she has been tolerating the dressing changes without complication. Her wound appears to be completely healed based on what I am seeing at this point. There does not appear to be any signs of active infection at this time which is great news. No fevers, chills, nausea, vomiting, or diarrhea. She did get the cushion for her foot as well that she ordered to try to help keep pressure off of this area. That looks like it may be very beneficial for her to be honest. 10/02/2020 upon evaluation today patient appears to be doing more poorly in regard to her foot ulcer. Unfortunately this has reopened since we saw her last week. It apparently did not take too long at all for this to happen. She is obviously somewhat disappointed as she was hopeful that that would be time this thing would stay closed. Fortunately there is no evidence of active infection at this point. No fevers, chills, nausea, vomiting, or diarrhea. With that being said I am good have to perform a little bit of sharp debridement clear away some of the debris currently. Including a minimal amount of callus at this time. 10/16/2020 upon evaluation today patient appears to be doing about the same in regard to her foot ulcer unfortunately. There does not appear to be any signs of active infection which is great news. No fever chills noted she has been tolerating the dressing changes without complication which is great news. Unfortunately she tells  me that she has been very depressed about the situation is getting very frustrating to her that she continues to have issues despite everything that she is trying to do to stay off of her foot. She is extremely discouraged and I do hate to hear this. Obviously we have been hopeful that if she got her shoes that would make a difference she is actually can be getting those tomorrow but I am not certain that that alone is good to be enough to get this to heal. We may end up having to consider going back into a total contact cast to get this to heal and then subsequently once we get her healed get her into her new diabetic shoes which will and my hope anyway keep this from reopening again. 10/23/2020 upon evaluation today patient appears to be doing a little worse both in regard to the size of her wound as well as in regard to the fact that she has erythema surrounding the wound and wrap around the lateral part of her foot where she tells me has been somewhat sore. Fortunately there does not appear to be any evidence of infection systemically but locally definitely there is erythema and warmth consistent with local cellulitis. I am glad to remove some of the callus as well. 10/30/2020 upon evaluation today patient appears to be doing well with regard to her wound on the plantar aspect of her foot. This is significantly improved compared to last week. 10/30/2020 upon evaluation today patient actually is making excellent progress her wound appears to show signs of great improvement which is wonderful and that she is extremely pleased  to hear this. She actually leaves Sunday to go on her trip with her half sister and friends. 11/13/2020 on evaluation today patient's wound actually appears to be doing quite well. There does not appear to be any signs of infection it has been 2 weeks since have seen her she does have a little bit of callus buildup here today but at the same time I do not believe the vacation time  set her back any just has not really made a lot of significant improvement. She is done with her antibiotics at this point. 11/20/2020 upon evaluation today patient appears to be doing poorly in regard to her foot. In fact this appears to be showing signs of Infection. She has erythema and warmth that is concerning. I know she is very discouraged as this seems to be a recurrent issue. I think we may need to delve further into the possibility of something deeper going on here as far as a structural infection. 11/27/2020 patient presents for 1 week follow-up. She had a culture of her left foot ulcer that grew staph aureus sensitive to Augmentin. This was called in by [place], [place] this morning. She is currently on doxycycline for previous culture result. Patient also had an x-ray done of her left foot that had conflicting results. We asked for clarification and was told we would have clarification on Monday. Patient states overall she is doing well. She tries to stay off of her foot is much as possible. Electronic Signature(s) Signed: 11/27/2020 5:05:14 PM By: Kalman Shan DO Entered By: Kalman Shan on 11/27/2020 14:42:35 -------------------------------------------------------------------------------- Physical Exam Details Patient Name: Date of Service: Dahan, [name] 11/27/2020 1:00 PM Medical Record Number: [id number] Patient Account Number: [id number] Date of Birth/Sex: Treating RN: [birth year] (75 y.o. [name] [name] Primary Care Provider: [name] Other Clinician: Referring Provider: Treating Provider/Extender: Mickle Asper in Treatment: 45 Constitutional respirations regular, non-labored and within target range for patient.. Cardiovascular 2+ dorsalis pedis/posterior tibialis pulses. Psychiatric pleasant and cooperative. Notes Left plantar foot wound: There are no signs of infection. There is significant callus and necrotic debris present. Post  debridement there was good granulation tissue present. Electronic Signature(s) Signed: 11/27/2020 5:05:14 PM By: Kalman Shan DO Entered By: Kalman Shan on 11/27/2020 14:43:33 -------------------------------------------------------------------------------- Physician Orders Details Patient Name: Date of Service: Garfinkel, [name] 11/27/2020 1:00 PM Medical Record Number: [id number] Patient Account Number: [id number] Date of Birth/Sex: Treating RN: [birth year] (75 y.o. [name] [name] Primary Care Provider: [name] Other Clinician: Referring Provider: Treating Provider/Extender: Mickle Asper in Treatment: 39 Verbal / Phone Orders: No Diagnosis Coding ICD-10 Coding Code Description E11.621 Type 2 diabetes mellitus with foot ulcer L97.522 Non-pressure chronic ulcer of other part of left foot with fat layer exposed M14.672 Charcot's joint, left ankle and foot E11.40 Type 2 diabetes mellitus with diabetic neuropathy, unspecified Follow-up Appointments Return Appointment in 1 week. Bathing/ Shower/ Hygiene May shower with protection but do not get wound dressing(s) wet. Off-Loading Open toe surgical shoe to: - left foot Additional Orders / Instructions Follow Nutritious Diet Wound Treatment Wound #1RRR - Foot Wound Laterality: Plantar, Left, Medial Prim Dressing: Promogran Prisma Matrix, 4.34 (sq in) (silver collagen) (DME) (Generic) Every Other Day/30 Days ary Discharge Instructions: Moisten collagen with saline or hydrogel Secondary Dressing: Woven Gauze Sponge, Non-Sterile 4x4 in (DME) (Generic) Every Other Day/30 Days Discharge Instructions: Apply over primary dressing as directed. Secured With: Child psychotherapist, Sterile 2x75 (in/in) (DME) (  Generic) Every Other Day/30 Days Discharge Instructions: Secure with stretch gauze as directed. Secured With: 1M Medipore H Soft Cloth Surgical Tape, 2x2 (in/yd) (DME) (Generic) Every  Other Day/30 Days Discharge Instructions: Secure dressing with tape as directed. Electronic Signature(s) Signed: 11/27/2020 6:20:01 PM By: [name] RN, BSN Signed: 12/17/2020 3:23:56 PM By: Kalman Shan DO Previous Signature: 11/27/2020 5:05:14 PM Version By: Kalman Shan DO Entered By: [name] on 11/27/2020 17:41:49 -------------------------------------------------------------------------------- Problem List Details Patient Name: Date of Service: [name] Guarneri RBA RA E. 11/27/2020 1:00 PM Medical Record Number: [id number] Patient Account Number: [id number] Date of Birth/Sex: Treating RN: [birth year] (75 y.o. [name] [name] Primary Care Provider: [name] Other Clinician: Referring Provider: Treating Provider/Extender: Mickle Asper in Treatment: 55 Active Problems ICD-10 Encounter Code Description Active Date MDM Diagnosis E11.621 Type 2 diabetes mellitus with foot ulcer 01/17/2020 No Yes L97.522 Non-pressure chronic ulcer of other part of left foot with fat layer exposed 01/17/2020 No Yes M14.672 Charcot's joint, left ankle and foot 01/17/2020 No Yes E11.40 Type 2 diabetes mellitus with diabetic neuropathy, unspecified 01/17/2020 No Yes Inactive Problems ICD-10 Code Description Active Date Inactive Date I10 Essential (primary) hypertension 01/17/2020 01/17/2020 Resolved Problems Electronic Signature(s) Signed: 11/27/2020 5:05:14 PM By: Kalman Shan DO Entered By: Kalman Shan on 11/27/2020 14:42:18 -------------------------------------------------------------------------------- Progress Note Details Patient Name: Date of Service: Penninger, [name] 11/27/2020 1:00 PM Medical Record Number: [id number] Patient Account Number: [id number] Date of Birth/Sex: Treating RN: [birth year] (75 y.o. [name] [name] Primary Care Provider: [name] Other Clinician: Referring Provider: Treating Provider/Extender: Mickle Asper in Treatment: 66 Subjective Chief Complaint Information obtained from Patient Left foot ulcer History of Present Illness (HPI) 01/17/2020 upon evaluation today patient presents for initial evaluation here in our clinic concerning issues she has been having with a left medial/plantar foot ulcer. This is actually been an issue for her since October 2020. She has been seeing Dr. Doran Durand for quite some time during that course. Fortunately there is no signs of active infection at this time. Or least no mention of this to have seen in general. With that being said unfortunately I do see some signs of erythema noted today that does have me concerned about the possibility of infection at this point in the surrounding area of the wound. There is also a warm to touch at the site which is somewhat concerning. Fortunately there is no evidence of systemic infection which is great news. The patient does have a history of diabetes mellitus type 2, Charcot foot which is what led to the wound, and hypertension. She notes that she was in a cast for some time with Dr. Doran Durand for about 8 weeks. During that time they were utilizing according to the patient silver nitrate along with a foam doughnut and then Coban to secure in place in the cast in place. With that being said I do not have the actual records to review we are going to try to get a hold of those unfortunately they would not flow over into care everywhere I did look today. She has been seeing Dr. Doran Durand and his physician assistant Larkin Ina up until the end of May and apparently is still seeing them on a regular basis every 2 weeks roughly. She has also tried Iodosorb without effect here. 01/24/2020 upon evaluation today patient actually appears to be doing quite well with regard to her wounds. She has been tolerating the dressing changes without complication. Fortunately there is  no signs of active infection spreading which is good news. Her culture  did show signs of Staph aureus I did place her on Augmentin due to the erythema surrounding the wound. With that being said the wound does appear to be doing better she has her longer walking cast/boot and I think that is actually good for her for the time being. I am considering reinitiating total contact cast when she gets back from vacation but next week she will actually be out of town at [employer] she knows not to get in the water but she still obviously is planning to enjoy herself she is going to take it easy on her foot however. 02/07/2020 upon evaluation today patient appears to be doing fairly well in regard to her ulcer on her foot. Fortunately there is no signs of severe infection at this time which is great news and overall very pleased in that regard. With that being said I do think that she could still benefit from a total contact cast. Nonetheless she is using her walking boot which at least provide some protection and that it prevents some of the friction occurring when she is ambulating. 02/14/2020 upon evaluation today patient appears to be doing well with regard to her foot ulcer. This is actually measuring a little bit smaller yet again this week. Overall very pleased with where things stand and I do not see any signs of active infection at this time which is also good news. Since she is measuring better the patient has wanting to somewhat hold off on proceeding with the total contact cast which I think is reasonable at this point. 02/28/2020 on evaluation today patient appears to be doing well in general in regard to her wound although she has a lot of callus buildup as compared to last time I saw her. This is can require sharp debridement today. I do believe she really needs the total contact cast as well which we have discussed previous. 7/23; patient comes in for a total contact cast change 03/06/2020 on evaluation today patient appears to be doing quite well with regard to her  wounds. Fortunately the wound bed is measuring smaller and looking much better there is little callus noted although there is some debridement necessary today. 03/13/2020 on evaluation today patient's wound actually appears to be doing excellent which is great news. With that being said unfortunately she is having some issues currently with her left leg where she does have cellulitis it appears. This may have come from an area that rubbed underneath the cast from last week that we noted we padded that area and it looks to be doing excellent at this point but nonetheless the leg was somewhat painful, swollen, and somewhat erythematous. She also had an elevated white blood cell count of 11.5 based on what I saw on looking at her records from the med center in [facility] from where she was seen yesterday. Unfortunately with [place] having a provider on vacation there was no one here in the clinic in the afternoon when she called therefore she went to the ER as advised. Subsequently they did not cut off the cast as they did not have anyone from orthopedics there to do so and subsequently also did not have the ability to do the Doppler for evaluation of DVT They recommended therefore given her dose of Eliquis as well as . Augmentin and sent her home to come see [place] today to have the cast taken off and then  she is supposed to go back to have the study for DVT performed they are following. 03/20/2020 upon evaluation today patient appears to be doing well with regard to her foot all things considered we have not been able to use the total contact cast due to the infection that she had last week. She has been on the doxycycline and she had a 10-day supply of that I do believe that is helping and her leg appears to be doing better. With that being said there is fortunately no signs of active infection systemically at this time which is good news. No fevers, chills, nausea, vomiting, or diarrhea. 03/27/2020 upon  evaluation today patient appears to be doing well with regard to her foot ulcer. There does not appear to be signs of active infection which is great news. Overall I am very pleased with where things stand at this point. 04/03/2020 upon evaluation today patient appears to be doing pretty well in regard to the overall appearance of her wound. Fortunately there is no signs of active infection at this time which is great news. No fevers, chills, nausea, vomiting, or diarrhea. With that being said she does have some blue-green drainage that actually is a little bit concerning to me for the possibility of Pseudomonas. I discussed that with the patient today. With that being said I do believe that we may be able to manage this however with the topical antibiotic cream as opposed to having to do anything oral especially since she seems to be doing so well with overall appearance of the wound. 04/10/2020 on evaluation today patient appears to be doing about the same roughly in regard to the overall size of her wound. With that being said she fortunately has not shown any signs of worsening overall which is good news. I do believe that she is doing a great job trying to offload but again she may still do better with the cast. I do not see in the blue-green drainage that we noticed previously I do believe the gentamicin help in this regard. 04/17/2020 on evaluation today patient's wound appears to be doing about the same at this point. There is no significant improvement at this point. No fever chills noted. She is up for put the cast back on the day. That she states in a couple weeks she will need to have this off to go to a workshop. 04/24/2020 on evaluation today patient appears to be doing significantly better in regard to her wound. Fortunately there is no signs of active infection and overall feel like she is making great progress the cast seems to have done excellent for her. 05/01/2020 upon evaluation today  patient presents for reevaluation she really does not appear to be doing too badly in regard to the actual wound on the left foot we have been managing. Unfortunately she has bilateral lower extremity edema with blisters between the webspace of her first and second toe on both feet. She has a tremendous amount of edema in the legs which I think is where this is coming from it does not appear to be infected but nonetheless I do believe this is can be something that needs to be addressed today. Obviously this means we probably will not be putting the cast on at this point. She attributes this to the fact that she was sitting with her feet on the floor much longer during a conference last week she had a great time but unfortunately had a lot of complications as  a result. 05/08/2020 upon evaluation today patient appears to be doing somewhat better in regard to her wounds at this time. Fortunately there is no signs of active infection which is great news. With that being said I do believe that the blisters have ruptured and unfortunately did not just reattach I will remove some of the blistered tissue today. With that being said I do think the wound itself on the plantar aspect of left foot does need to have sharp debridement. 05/15/2020 upon evaluation today patient appears to be doing about the same in regard to her foot ulcer. Unfortunately in the past week her husband had a fall where he sustained a mild traumatic brain bleed. Fortunately he is doing better but being that he was in the hospital she had a walk on this a lot more. The wound does not appear to be any better is also not really appearing to be significantly worse which is good news. There is no signs of active infection at this time. 10/14; patient with a small diabetic wound on the medial part of her left foot. We have been using silver collagen a total contact cast making good progress. I think the patient had a series of blisters on her  dorsal foot probably secondary to having her legs recumbent for 3 days while in a conference in [place]. We wrapped her leg last week these are all healed. We did not previously have her in compression on the right leg. 05/29/2020 upon evaluation today patient appears to be doing well with regard to the wound on the plantar aspect of her foot medially. This is measuring smaller and looking much better than last time I saw her. Again when I did see her last was 2 weeks back and the wound was significantly larger. I do believe the cast is helping and I believe the collagen is a good option for her. 06/05/2020 on evaluation today patient appears to be doing well with regard to her foot ulcer this is actually measuring significantly better and overall I feel like she is doing excellent. There is no signs of active infection at this time. 06/12/2020 upon evaluation today patient actually continues to show signs of good improvement which is excellent news. I am extremely pleased with how she seems to be progressing at this point in regard to her wound. There is still some depth to the wound but I do believe the collagen is helping her quite a bit. 06/19/2020 upon evaluation today patient appears to be doing well with regard to her plantar foot ulcer. She is actually making excellent progress and in fact this appears to be almost completely healed. With that being said I do believe that the patient is going to actually require 1 more week in the cast although after that I am hopeful she will be ready for discharge. 06/26/2020 on evaluation today patient appears to be doing well in regard to her wound currently. Fortunately there is no signs of active infection in general I feel like she is doing excellent. This appears to be completely healed I think she is ready to come out of the cast. 11/29; patient comes back in the clinic today with a very quick reopening in the exact same area on the medial plantar foot.  She had been healed out last time. She went back into some new balance shoes that she got at [employer] with a custom insert. As it turns out this wound also happened when wearing these shoes although there  was some modification made I think with the wound initially happened they added some foam around the wound area. This obviously is not going to be sufficient. 07/17/2020 on evaluation today patient appears to be doing well with regard to her wound. Fortunately she seems to be making good progress. Unfortunately she was in the hospital due to a issue with colitis and had just been discharged today in fact. She tells me that her biggest concern here is that a lot of her numbers especially her creatinine were somewhat elevated and problematic. She is can be following up with her provider in order to have a further work-up at this time. With that being said she did need to come back in for a cast change. She did not allow them to remove the cast due to the fact that she did not feel like that was the issue whatsoever and indeed it does not appear that was the case her wound appears to be doing excellent today. 07/24/2020 upon evaluation today patient appears to be doing well in regard to her foot ulcer. She still has a small opening but this is showing signs of excellent improvement overall but they were very close to complete resolution. No fevers, chills, nausea, vomiting, or diarrhea. 07/31/2020 upon evaluation today patient actually appears to be doing excellent she is actually completely healed this is great news. Fortunately there is no signs of active infection at this time. No fevers, chills, nausea, vomiting, or diarrhea. The patient tells me that she did see Dr. Paulla Dolly in order to get to [place] so that she can have a custom shoe made. She saw him earlier last week. She does have an appointment with Liliane Channel on the sixth I believe on January she tells me Readmission: 08/28/2020 on evaluation today patient  appears to be doing poorly in regard to her wound currently. She tells me this has reopened. The good news that she did get measured for and actually her shoes are on the way in from [facility].. With that being said she has been trying to stay off of this is much as possible using her wheelchair around home. Nonetheless has been somewhat difficult. The good news is her shoes should be here next week 09/04/2020 upon evaluation today patient appears to be doing well with regard to her wound. There is a little bit of callus buildup but nothing too significant she does tell me she was very active this week. Fortunately there is no signs of systemic infection at this point. The dorsal foot wound actually appears to be doing better. I think this is healed. 09/11/2020 upon evaluation today patient appears to actually be doing quite well in my opinion based on what I am seeing today. Fortunately there is no signs of infection in fact her mother is certain that this is even open any longer based on what I see. Fortunately I think the patient has been doing everything she can to try to keep this under control. 09/18/2020 upon evaluation today patient appears to be doing about the same in regard to her foot ulcer. She has been tolerating the dressing changes without complication. Fortunately there is no sign of active infection at this time. No fevers, chills, nausea, vomiting, or diarrhea. She is going require some sharp debridement today. 09/25/2020 upon evaluation today patient appears to be doing well with regard to his wounds she has been tolerating the dressing changes without complication. Her wound appears to be completely healed based on what  I am seeing at this point. There does not appear to be any signs of active infection at this time which is great news. No fevers, chills, nausea, vomiting, or diarrhea. She did get the cushion for her foot as well that she ordered to try to help keep pressure off  of this area. That looks like it may be very beneficial for her to be honest. 10/02/2020 upon evaluation today patient appears to be doing more poorly in regard to her foot ulcer. Unfortunately this has reopened since we saw her last week. It apparently did not take too long at all for this to happen. She is obviously somewhat disappointed as she was hopeful that that would be time this thing would stay closed. Fortunately there is no evidence of active infection at this point. No fevers, chills, nausea, vomiting, or diarrhea. With that being said I am good have to perform a little bit of sharp debridement clear away some of the debris currently. Including a minimal amount of callus at this time. 10/16/2020 upon evaluation today patient appears to be doing about the same in regard to her foot ulcer unfortunately. There does not appear to be any signs of active infection which is great news. No fever chills noted she has been tolerating the dressing changes without complication which is great news. Unfortunately she tells me that she has been very depressed about the situation is getting very frustrating to her that she continues to have issues despite everything that she is trying to do to stay off of her foot. She is extremely discouraged and I do hate to hear this. Obviously we have been hopeful that if she got her shoes that would make a difference she is actually can be getting those tomorrow but I am not certain that that alone is good to be enough to get this to heal. We may end up having to consider going back into a total contact cast to get this to heal and then subsequently once we get her healed get her into her new diabetic shoes which will and my hope anyway keep this from reopening again. 10/23/2020 upon evaluation today patient appears to be doing a little worse both in regard to the size of her wound as well as in regard to the fact that she has erythema surrounding the wound and wrap around  the lateral part of her foot where she tells me has been somewhat sore. Fortunately there does not appear to be any evidence of infection systemically but locally definitely there is erythema and warmth consistent with local cellulitis. I am glad to remove some of the callus as well. 10/30/2020 upon evaluation today patient appears to be doing well with regard to her wound on the plantar aspect of her foot. This is significantly improved compared to last week. 10/30/2020 upon evaluation today patient actually is making excellent progress her wound appears to show signs of great improvement which is wonderful and that she is extremely pleased to hear this. She actually leaves Sunday to go on her trip with her half sister and friends. 11/13/2020 on evaluation today patient's wound actually appears to be doing quite well. There does not appear to be any signs of infection it has been 2 weeks since have seen her she does have a little bit of callus buildup here today but at the same time I do not believe the vacation time set her back any just has not really made a lot of significant improvement. She  is done with her antibiotics at this point. 11/20/2020 upon evaluation today patient appears to be doing poorly in regard to her foot. In fact this appears to be showing signs of Infection. She has erythema and warmth that is concerning. I know she is very discouraged as this seems to be a recurrent issue. I think we may need to delve further into the possibility of something deeper going on here as far as a structural infection. 11/27/2020 patient presents for 1 week follow-up. She had a culture of her left foot ulcer that grew staph aureus sensitive to Augmentin. This was called in by [place], [place] this morning. She is currently on doxycycline for previous culture result. Patient also had an x-ray done of her left foot that had conflicting results. We asked for clarification and was told we would have clarification on  Monday. Patient states overall she is doing well. She tries to stay off of her foot is much as possible. Patient History Information obtained from Patient. Family History Cancer - Father, Heart Disease - Mother, Hypertension - Mother, Stroke - Father,Mother, No family history of Diabetes, Hereditary Spherocytosis, Kidney Disease, Lung Disease, Seizures, Thyroid Problems, Tuberculosis. Social History Never smoker, Marital Status - Single, Alcohol Use - Rarely, Drug Use - No History, Caffeine Use - Daily - coffee. Medical History Respiratory Patient has history of Sleep Apnea - bipap Cardiovascular Patient has history of Deep Vein Thrombosis - right calf, Hypertension Gastrointestinal Patient has history of Colitis Endocrine Patient has history of Type II Diabetes Musculoskeletal Patient has history of Gout, Osteoarthritis Neurologic Patient has history of Neuropathy Medical A Surgical History Notes nd Cardiovascular Hyperlipidemia Integumentary (Skin) Psoriasis Musculoskeletal Charcot foot Objective Constitutional respirations regular, non-labored and within target range for patient.. Vitals Time Taken: 1:06 PM, Height: 66 in, Weight: 245 lbs, BMI: 39.5, Temperature: 98.8 F, Pulse: 74 bpm, Respiratory Rate: 20 breaths/min, Blood Pressure: 147/80 mmHg. Cardiovascular 2+ dorsalis pedis/posterior tibialis pulses. Psychiatric pleasant and cooperative. General Notes: Left plantar foot wound: There are no signs of infection. There is significant callus and necrotic debris present. Post debridement there was good granulation tissue present. Integumentary (Hair, Skin) Wound #1RRR status is Open. Original cause of wound was Gradually Appeared. The date acquired was: 05/11/2019. The wound has been in treatment 45 weeks. The wound is located on the [place]. The wound measures 1.3cm length x 0.4cm width x 0.2cm depth; 0.408cm^2 area and 0.082cm^3 volume. There is Fat  Layer (Subcutaneous Tissue) exposed. There is no tunneling or undermining noted. There is a medium amount of serosanguineous drainage noted. The wound margin is thickened. There is large (67-100%) pink granulation within the wound bed. There is no necrotic tissue within the wound bed. General Notes: callous to periwound. Redness has decreased at the marked line. Assessment Active Problems ICD-10 Type 2 diabetes mellitus with foot ulcer Non-pressure chronic ulcer of other part of left foot with fat layer exposed Charcot's joint, left ankle and foot Type 2 diabetes mellitus with diabetic neuropathy, unspecified Patient presents for 1 week follow-up. The left plantar foot wound appears well-healing with no signs of infection. There was a lot of necrotic debris and callus buildup that was removed. I would like to switch the previous dressing from alginate to collagen since she reports very little drainage. I will see her back in 1 week. Culture results were discussed with the patient and she was asked to stop Doxy and start Augmentin. X-ray results were also discussed with the patient however we are  awaiting clarification next week. Procedures Wound #1RRR Pre-procedure diagnosis of Wound #1RRR is a Diabetic Wound/Ulcer of the Lower Extremity located on the Left,Medial,Plantar Foot .Severity of Tissue Pre Debridement is: Fat layer exposed. There was a Excisional Skin/Subcutaneous Tissue Debridement with a total area of 9 sq cm performed by Kalman Shan, DO. With the following instrument(s): Curette to remove Viable and Non-Viable tissue/material. Material removed includes Callus, Subcutaneous Tissue, Slough, and Skin: Epidermis after achieving pain control using Lidocaine 4% Topical Solution. No specimens were taken. A time out was conducted at 14:10, prior to the start of the procedure. A Minimum amount of bleeding was controlled with Pressure. The procedure was tolerated well with a pain level  of 0 throughout and a pain level of 0 following the procedure. Post Debridement Measurements: 1.3cm length x 0.4cm width x 0.2cm depth; 0.082cm^3 volume. Character of Wound/Ulcer Post Debridement is improved. Severity of Tissue Post Debridement is: Fat layer exposed. Post procedure Diagnosis Wound #1RRR: Same as Pre-Procedure Plan Follow-up Appointments: Return Appointment in 1 week. Bathing/ Shower/ Hygiene: May shower with protection but do not get wound dressing(s) wet. Off-Loading: Open toe surgical shoe to: - left foot Additional Orders / Instructions: Follow Nutritious Diet WOUND #1RRR: - Foot Wound Laterality: Plantar, Left, Medial Prim Dressing: Promogran Prisma Matrix, 4.34 (sq in) (silver collagen) (DME) (Generic) Every Other Day/30 Days ary Discharge Instructions: Moisten collagen with saline or hydrogel Secondary Dressing: Woven Gauze Sponge, Non-Sterile 4x4 in (DME) (Generic) Every Other Day/30 Days Discharge Instructions: Apply over primary dressing as directed. Secured With: Child psychotherapist, Sterile 2x75 (in/in) (DME) (Generic) Every Other Day/30 Days Discharge Instructions: Secure with stretch gauze as directed. Secured With: 16M Medipore H Soft Cloth Surgical T ape, 2x2 (in/yd) (DME) (Generic) Every Other Day/30 Days Discharge Instructions: Secure dressing with tape as directed. 1. Change from alginate to Collagen every other day 2. Aggressive offloading 3. In office sharp debridement 4. Follow-up next week Electronic Signature(s) Signed: 11/27/2020 6:20:01 PM By: [name] RN, BSN Signed: 12/17/2020 3:23:56 PM By: Kalman Shan DO Previous Signature: 11/27/2020 5:05:14 PM Version By: Kalman Shan DO Entered By: [name] on 11/27/2020 17:42:24 -------------------------------------------------------------------------------- HxROS Details Patient Name: Date of Service: Feasel, [name] 11/27/2020 1:00 PM Medical Record Number:  [id number] Patient Account Number: [id number] Date of Birth/Sex: Treating RN: Sep 13, 1945 (75 y.o. [name] [name] Primary Care Provider: [name] Other Clinician: Referring Provider: Treating Provider/Extender: Mickle Asper in Treatment: 36 Information Obtained From Patient Respiratory Medical History: Positive for: Sleep Apnea - bipap Cardiovascular Medical History: Positive for: Deep Vein Thrombosis - right calf; Hypertension Past Medical History Notes: Hyperlipidemia Gastrointestinal Medical History: Positive for: Colitis Endocrine Medical History: Positive for: Type II Diabetes Time with diabetes: 2 years Treated with: Insulin, Oral agents Blood sugar tested every day: No Integumentary (Skin) Medical History: Past Medical History Notes: Psoriasis Musculoskeletal Medical History: Positive for: Gout; Osteoarthritis Past Medical History Notes: Charcot foot Neurologic Medical History: Positive for: Neuropathy Immunizations Pneumococcal Vaccine: Received Pneumococcal Vaccination: Yes Implantable Devices None Family and Social History Cancer: Yes - Father; Diabetes: No; Heart Disease: Yes - Mother; Hereditary Spherocytosis: No; Hypertension: Yes - Mother; Kidney Disease: No; Lung Disease: No; Seizures: No; Stroke: Yes - Father,Mother; Thyroid Problems: No; Tuberculosis: No; Never smoker; Marital Status - Single; Alcohol Use: Rarely; Drug Use: No History; Caffeine Use: Daily - coffee; Financial Concerns: No; Food, Clothing or Shelter Needs: No; Support System Lacking: No; Transportation Concerns: No Electronic Signature(s) Signed: 11/27/2020  5:05:14 PM By: Kalman Shan DO Signed: 11/27/2020 6:20:01 PM By: [name] RN, BSN Entered By: Kalman Shan on 11/27/2020 14:42:41 -------------------------------------------------------------------------------- [place] Details Patient Name: Date of Service: [place], [name]  11/27/2020 Medical Record Number: [id number] Patient Account Number: [id number] Date of Birth/Sex: Treating RN: 01/22/1946 (75 y.o. [name], [name] Primary Care Provider: [name] Other Clinician: Referring Provider: Treating Provider/Extender: Mickle Asper in Treatment: 45 Diagnosis Coding ICD-10 Codes Code Description E11.621 Type 2 diabetes mellitus with foot ulcer L97.522 Non-pressure chronic ulcer of other part of left foot with fat layer exposed M14.672 Charcot's joint, left ankle and foot E11.40 Type 2 diabetes mellitus with diabetic neuropathy, unspecified Facility Procedures Electronic Signature(s) Signed: 11/27/2020 5:05:14 PM By: Kalman Shan DO Entered By: Kalman Shan on 11/27/2020 17:04:44

## 2020-12-18 ENCOUNTER — Encounter (HOSPITAL_BASED_OUTPATIENT_CLINIC_OR_DEPARTMENT_OTHER): Payer: Medicare Other | Admitting: Physician Assistant

## 2020-12-18 ENCOUNTER — Other Ambulatory Visit: Payer: Self-pay

## 2020-12-18 DIAGNOSIS — L97522 Non-pressure chronic ulcer of other part of left foot with fat layer exposed: Secondary | ICD-10-CM | POA: Diagnosis not present

## 2020-12-18 DIAGNOSIS — E785 Hyperlipidemia, unspecified: Secondary | ICD-10-CM | POA: Diagnosis not present

## 2020-12-18 DIAGNOSIS — E11621 Type 2 diabetes mellitus with foot ulcer: Secondary | ICD-10-CM | POA: Diagnosis not present

## 2020-12-18 DIAGNOSIS — I10 Essential (primary) hypertension: Secondary | ICD-10-CM | POA: Diagnosis not present

## 2020-12-18 DIAGNOSIS — M14672 Charcot's joint, left ankle and foot: Secondary | ICD-10-CM | POA: Diagnosis not present

## 2020-12-18 DIAGNOSIS — E1140 Type 2 diabetes mellitus with diabetic neuropathy, unspecified: Secondary | ICD-10-CM | POA: Diagnosis not present

## 2020-12-18 MED ORDER — ALCOHOL SWABS PADS: MEDICATED_PAD | 12 refills | Status: DC

## 2020-12-18 MED ORDER — GLUCOSE METER TEST VI STRP: ORAL_STRIP | 12 refills | Status: DC

## 2020-12-18 MED ORDER — LEVEMIR FLEXTOUCH 100 UNIT/ML ~~LOC~~ SOPN: PEN_INJECTOR | SUBCUTANEOUS | 2 refills | Status: DC

## 2020-12-18 MED ORDER — BD PEN NEEDLE MINI U/F 31G X 5 MM MISC: 3 refills | Status: DC

## 2020-12-18 NOTE — Progress Notes (Signed)
[name] [name], [name] [name] ([id number]) Visit Report for 12/18/2020 Arrival Information Details Patient Name: Date of Service: [name] [name], [name] [name] 12/18/2020 8:15 A M Medical Record Number: [id number] Patient Account Number: [id number] Date of Birth/Sex: Treating RN: [birth year] (75 y.o. [name] [name], [name] [name] Primary Care [name] [name]: [name] [name] Other Clinician: Referring [name] [name]: Treating [name] [name]/Extender: [name] [name] in Treatment: 73 Visit Information History Since Last Visit Added or deleted any medications: No Patient Arrived: Wheel Chair Any new allergies or adverse reactions: No Arrival Time: 08:22 Had a fall or experienced change in No Accompanied By: self activities of daily living that may affect Transfer Assistance: None risk of falls: Patient Identification Verified: Yes Signs or symptoms of abuse/neglect since last visito No Secondary Verification Process Completed: Yes Hospitalized since last visit: No Patient Requires Transmission-Based Precautions: No Implantable device outside of the clinic excluding No Patient Has Alerts: No cellular tissue based products placed in the center since last visit: Has Dressing in Place as Prescribed: Yes Pain Present Now: No Electronic Signature(s) Signed: 12/18/2020 5:14:45 PM By: [name] Hammock RN Entered By: [name] [name] on 12/18/2020 08:23:10 -------------------------------------------------------------------------------- Lower Extremity Assessment Details Patient Name: Date of Service: [name] [name] 12/18/2020 8:15 A M Medical Record Number: [id number] Patient Account Number: [id number] Date of Birth/Sex: Treating RN: [birth year] (75 y.o. [name] [name], [name] [name] Primary Care [name] [name]: [name] [name] Other Clinician: Referring [name] [name]: Treating [name] [name]/Extender: [name] [name] Weeks in Treatment: 48 Edema Assessment Assessed: [Left: Yes] [Right: No] Edema: [Left: Ye] [Right: s] Calf Left:  Right: Point of Measurement: 29 cm From Medial Instep 38 cm Ankle Left: Right: Point of Measurement: 12 cm From Medial Instep 23 cm Vascular Assessment Pulses: Dorsalis Pedis Palpable: [Left:Yes] Posterior Tibial Palpable: [Left:Yes] Electronic Signature(s) Signed: 12/18/2020 5:14:45 PM By: [name] Hammock RN Entered By: [name] [name] on 12/18/2020 08:29:26 -------------------------------------------------------------------------------- Multi-Disciplinary Care Plan Details Patient Name: Date of Service: [name] [name] 12/18/2020 8:15 A M Medical Record Number: [id number] Patient Account Number: [id number] Date of Birth/Sex: Treating RN: 09-23-1945 (75 y.o. [name] [name] Primary Care [name] [name]: [name] [name] Other Clinician: Referring [name] [name]: Treating [name] [name]/Extender: [name] [name] in Treatment: [name] [name] reviewed with physician Active Inactive Nutrition Nursing Diagnoses: Impaired glucose control: actual or potential Potential for alteratiion in Nutrition/Potential for imbalanced nutrition Goals: Patient/caregiver verbalizes understanding of need to maintain therapeutic glucose control per primary care physician Date Initiated: 01/17/2020 Date Inactivated: 02/14/2020 Target Resolution Date: 02/14/2020 Goal Status: Met Patient/caregiver will maintain therapeutic glucose control Date Initiated: 01/17/2020 Target Resolution Date: 01/15/2021 Goal Status: Active Interventions: Assess HgA1c results as ordered upon admission and as needed Assess patient nutrition upon admission and as needed per policy Provide education on elevated blood sugars and impact on wound healing Treatment Activities: Patient referred to Primary Care Physician for further nutritional evaluation : 01/17/2020 Notes: Wound/Skin Impairment Nursing Diagnoses: Impaired tissue integrity Knowledge deficit related to ulceration/compromised skin  integrity Goals: Patient/caregiver will verbalize understanding of skin care regimen Date Initiated: 01/17/2020 Target Resolution Date: 01/15/2021 Goal Status: Active Ulcer/skin breakdown will have a volume reduction of 30% by week 4 Date Initiated: 01/17/2020 Date Inactivated: 02/14/2020 Target Resolution Date: 02/14/2020 Goal Status: Met Ulcer/skin breakdown will have a volume reduction of 50% by week 8 Date Initiated: 02/14/2020 Date Inactivated: 03/13/2020 Target Resolution Date: 03/13/2020 Goal Status: Met Ulcer/skin breakdown will have a volume reduction of 80% by week 12 Date Initiated: 03/13/2020 Date Inactivated: 04/10/2020 Target Resolution Date: 04/10/2020 Goal Status:  Unmet Unmet Reason: infection Interventions: Assess patient/caregiver ability to obtain necessary supplies Assess patient/caregiver ability to perform ulcer/skin care regimen upon admission and as needed Assess ulceration(s) every visit Provide education on ulcer and skin care Treatment Activities: Skin care regimen initiated : 01/17/2020 Topical wound management initiated : 01/17/2020 Notes: Electronic Signature(s) Signed: 12/18/2020 6:13:52 PM By: [name] Gouty RN, BSN Entered By: [name] [name] on 12/18/2020 09:13:42 -------------------------------------------------------------------------------- Pain Assessment Details Patient Name: Date of Service: [name] [name] 12/18/2020 8:15 A M Medical Record Number: [id number] Patient Account Number: [id number] Date of Birth/Sex: Treating RN: 04-03-1946 (75 y.o. [name] [name] Primary Care [name] [name]: [name] [name] Other Clinician: Referring [name] [name]: Treating [name] [name]/Extender: [name] [name] in Treatment: 30 Active Problems Location of Pain Severity and Description of Pain Patient Has Paino No Site Locations Rate the pain. Current Pain Level: 0 Pain Management and Medication Current Pain Management: Electronic Signature(s) Signed:  12/18/2020 5:14:45 PM By: [name] Hammock RN Entered By: [name] [name] on 12/18/2020 08:26:33 -------------------------------------------------------------------------------- Patient/Caregiver Education Details Patient Name: Date of Service: [name] [name] 5/11/2022andnbsp8:15 A M Medical Record Number: [id number] Patient Account Number: [id number] Date of Birth/Gender: Treating RN: [birth year] (75 y.o. [name] [name] Primary Care Physician: [name] [name] Other Clinician: Referring Physician: Treating Physician/Extender: [name] [name] in Treatment: 55 Education Assessment Education Provided To: Patient Education Topics Provided Elevated Blood Sugar/ Impact on Healing: Methods: Explain/Verbal Responses: Reinforcements needed, State content correctly Offloading: Methods: Explain/Verbal Responses: Reinforcements needed, State content correctly Wound/Skin Impairment: Methods: Explain/Verbal Responses: Reinforcements needed, State content correctly Electronic Signature(s) Signed: 12/18/2020 6:13:52 PM By: [name] Gouty RN, BSN Entered By: [name] [name] on 12/18/2020 09:14:15 -------------------------------------------------------------------------------- Wound Assessment Details Patient Name: Date of Service: [name] [name] 12/18/2020 8:15 A M Medical Record Number: [id number] Patient Account Number: [id number] Date of Birth/Sex: Treating RN: 03/12/1946 (75 y.o. [name] [name], [name] [name] Primary Care La Shehan: [name] [name] Other Clinician: Referring Jaiah Weigel: Treating Kaidon Kinker/Extender: [name] [name] Weeks in Treatment: 48 Wound Status Wound Number: [id number] Primary Diabetic Wound/Ulcer of the Lower Extremity Etiology: Wound Location: Left, Medial, Plantar Foot Wound Open Wounding Event: Gradually Appeared Status: Date Acquired: 05/11/2019 Comorbid Sleep Apnea, Deep Vein Thrombosis, Hypertension, Colitis, Type Weeks Of  Treatment: 48 History: II Diabetes, Gout, Osteoarthritis, Neuropathy Clustered Wound: No Photos Wound Measurements Length: (cm) 0.3 Width: (cm) 0.3 Depth: (cm) 0.2 Area: (cm) 0.071 Volume: (cm) 0.014 % Reduction in Area: 93% % Reduction in Volume: 96.6% Epithelialization: Medium (34-66%) Tunneling: No Undermining: No Wound Description Classification: Grade 2 Wound Margin: Thickened Exudate Amount: Medium Exudate Type: Serosanguineous Exudate Color: red, brown Foul Odor After Cleansing: No Slough/Fibrino Yes Wound Bed Granulation Amount: Large (67-100%) Exposed Structure Granulation Quality: Red, Pink Fascia Exposed: No Necrotic Amount: Small (1-33%) Fat Layer (Subcutaneous Tissue) Exposed: Yes Necrotic Quality: Adherent Slough Tendon Exposed: No Muscle Exposed: No Joint Exposed: No Bone Exposed: No Electronic Signature(s) Signed: 12/18/2020 4:50:41 PM By: Sandre Kitty Signed: 12/18/2020 5:14:45 PM By: [name] Hammock RN Entered By: Sandre Kitty on 12/18/2020 16:19:14 -------------------------------------------------------------------------------- Vitals Details Patient Name: Date of Service: [name] [name], [name] RA E. 12/18/2020 8:15 A M Medical Record Number: [id number] Patient Account Number: [id number] Date of Birth/Sex: Treating RN: 1946/01/26 (75 y.o. [name] [name] Primary Care Magnus Crescenzo: [name] [name] Other Clinician: Referring Kayleah Appleyard: Treating Kirkland Figg/Extender: [name] [name] in Treatment: 48 Vital Signs Time Taken: 08:24 Temperature (F): 98.5 Height (in): 66 Pulse (bpm): 74 Weight (lbs): 245 Respiratory Rate (breaths/min):  17 Body Mass Index (BMI): 39.5 Blood Pressure (mmHg): 138/65 Reference Range: 80 - 120 mg / dl Electronic Signature(s) Signed: 12/18/2020 5:14:45 PM By: [name] Hammock RN Entered By: [name] [name] on 12/18/2020 08:25:17

## 2020-12-18 NOTE — Progress Notes (Addendum)
MARSELLA, SZOTT ([id number]) Visit Report for 12/18/2020 Chief Complaint Document Details Patient Name: Date of Service: [name], PETRI RA E. 12/18/2020 8:15 A M Medical Record Number: [id number] Patient Account Number: [id number] Date of Birth/Sex: Treating RN: 1945/11/01 (75 y.o. [name] Other Clinician: Referring Provider: Treating Provider/Extender: Adele Schilder in Treatment: 34 Information Obtained from: Patient Chief Complaint Left foot ulcer Electronic Signature(s) Signed: 12/18/2020 9:11:43 AM By: Worthy Keeler PA-C Entered By: Worthy Keeler on 12/18/2020 09:11:42 -------------------------------------------------------------------------------- Debridement Details Patient Name: Date of Service: [name] 12/18/2020 8:15 A M Medical Record Number: [id number] Patient Account Number: [id number] Date of Birth/Sex: Treating RN: January 17, 1946 (75 y.o. [name] Other Clinician: Referring Provider: Treating Provider/Extender: Adele Schilder in Treatment: 48 Debridement Performed for Assessment: Wound #[id number] Left,Medial,Plantar Foot Performed By: Physician Worthy Keeler, PA Debridement Type: Debridement Severity of Tissue Pre Debridement: Fat layer exposed Level of Consciousness (Pre-procedure): Awake and Alert Pre-procedure Verification/Time Out Yes - 09:10 Taken: Start Time: 09:12 Pain Control: Other : benzocaine 20% spray T Area Debrided (L x W): otal 1 (cm) x 1 (cm) = 1 (cm) Tissue and other material debrided: Viable, Non-Viable, Callus, Subcutaneous, Skin: Epidermis Level: Skin/Subcutaneous Tissue Debridement Description: Excisional Instrument: Curette Bleeding: Minimum Hemostasis Achieved: Pressure End Time: 09:18 Procedural Pain: 0 Post Procedural Pain: 0 Response to Treatment: Procedure was tolerated well Level of  Consciousness (Post- Awake and Alert procedure): Post Debridement Measurements of Total Wound Length: (cm) 0.7 Width: (cm) 0.6 Depth: (cm) 0.1 Volume: (cm) 0.033 Character of Wound/Ulcer Post Debridement: Improved Severity of Tissue Post Debridement: Fat layer exposed Post Procedure Diagnosis Same as Pre-procedure Electronic Signature(s) Signed: 12/18/2020 5:37:47 PM By: Worthy Keeler PA-C Signed: 12/18/2020 6:13:52 PM By: [name] RN, BSN Entered By: [name] on 12/18/2020 09:16:40 -------------------------------------------------------------------------------- HPI Details Patient Name: Date of Service: [name] [name], [name] RA E. 12/18/2020 8:15 A M Medical Record Number: [id number] Patient Account Number: [id number] Date of Birth/Sex: Treating RN: 28-Dec-1945 (75 y.o. [name] Other Clinician: Referring Provider: Treating Provider/Extender: Adele Schilder in Treatment: 2 History of Present Illness HPI Description: 01/17/2020 upon evaluation today patient presents for initial evaluation here in our clinic concerning issues she has been having with a left medial/plantar foot ulcer. This is actually been an issue for her since October 2020. She has been seeing Dr. Doran Durand for quite some time during that course. Fortunately there is no signs of active infection at this time. Or least no mention of this to have seen in general. With that being said unfortunately I do see some signs of erythema noted today that does have me concerned about the possibility of infection at this point in the surrounding area of the wound. There is also a warm to touch at the site which is somewhat concerning. Fortunately there is no evidence of systemic infection which is great news. The patient does have a history of diabetes mellitus type 2, Charcot foot which is what led to the wound, and hypertension. She notes that she was in a cast for  some time with Dr. Doran Durand for about 8 weeks. During that time they were utilizing according to the patient silver nitrate along with a foam doughnut and then Coban to secure in place in the cast in place. With that being said I do not have  the actual records to review we are going to try to get a hold of those unfortunately they would not flow over into care everywhere I did look today. She has been seeing Dr. Doran Durand and his physician assistant Larkin Ina up until the end of May and apparently is still seeing them on a regular basis every 2 weeks roughly. She has also tried Iodosorb without effect here. 01/24/2020 upon evaluation today patient actually appears to be doing quite well with regard to her wounds. She has been tolerating the dressing changes without complication. Fortunately there is no signs of active infection spreading which is good news. Her culture did show signs of Staph aureus I did place her on Augmentin due to the erythema surrounding the wound. With that being said the wound does appear to be doing better she has her longer walking cast/boot and I think that is actually good for her for the time being. I am considering reinitiating total contact cast when she gets back from vacation but next week she will actually be out of town at [employer] she knows not to get in the water but she still obviously is planning to enjoy herself she is going to take it easy on her foot however. 02/07/2020 upon evaluation today patient appears to be doing fairly well in regard to her ulcer on her foot. Fortunately there is no signs of severe infection at this time which is great news and overall very pleased in that regard. With that being said I do think that she could still benefit from a total contact cast. Nonetheless she is using her walking boot which at least provide some protection and that it prevents some of the friction occurring when she is ambulating. 02/14/2020 upon evaluation today patient  appears to be doing well with regard to her foot ulcer. This is actually measuring a little bit smaller yet again this week. Overall very pleased with where things stand and I do not see any signs of active infection at this time which is also good news. Since she is measuring better the patient has wanting to somewhat hold off on proceeding with the total contact cast which I think is reasonable at this point. 02/28/2020 on evaluation today patient appears to be doing well in general in regard to her wound although she has a lot of callus buildup as compared to last time I saw her. This is can require sharp debridement today. I do believe she really needs the total contact cast as well which we have discussed previous. 7/23; patient comes in for a total contact cast change 03/06/2020 on evaluation today patient appears to be doing quite well with regard to her wounds. Fortunately the wound bed is measuring smaller and looking much better there is little callus noted although there is some debridement necessary today. 03/13/2020 on evaluation today patient's wound actually appears to be doing excellent which is great news. With that being said unfortunately she is having some issues currently with her left leg where she does have cellulitis it appears. This may have come from an area that rubbed underneath the cast from last week that we noted we padded that area and it looks to be doing excellent at this point but nonetheless the leg was somewhat painful, swollen, and somewhat erythematous. She also had an elevated white blood cell count of 11.5 based on what I saw on looking at her records from the med center in [facility] from where she was seen yesterday.  Unfortunately with [place] having a provider on vacation there was no one here in the clinic in the afternoon when she called therefore she went to the ER as advised. Subsequently they did not cut off the cast as they did not have anyone from orthopedics  there to do so and subsequently also did not have the ability to do the Doppler for evaluation of DVT They recommended therefore given her dose of Eliquis as well as . Augmentin and sent her home to come see [place] today to have the cast taken off and then she is supposed to go back to have the study for DVT performed they are following. 03/20/2020 upon evaluation today patient appears to be doing well with regard to her foot all things considered we have not been able to use the total contact cast due to the infection that she had last week. She has been on the doxycycline and she had a 10-day supply of that I do believe that is helping and her leg appears to be doing better. With that being said there is fortunately no signs of active infection systemically at this time which is good news. No fevers, chills, nausea, vomiting, or diarrhea. 03/27/2020 upon evaluation today patient appears to be doing well with regard to her foot ulcer. There does not appear to be signs of active infection which is great news. Overall I am very pleased with where things stand at this point. 04/03/2020 upon evaluation today patient appears to be doing pretty well in regard to the overall appearance of her wound. Fortunately there is no signs of active infection at this time which is great news. No fevers, chills, nausea, vomiting, or diarrhea. With that being said she does have some blue-green drainage that actually is a little bit concerning to me for the possibility of Pseudomonas. I discussed that with the patient today. With that being said I do believe that we may be able to manage this however with the topical antibiotic cream as opposed to having to do anything oral especially since she seems to be doing so well with overall appearance of the wound. 04/10/2020 on evaluation today patient appears to be doing about the same roughly in regard to the overall size of her wound. With that being said she fortunately has not  shown any signs of worsening overall which is good news. I do believe that she is doing a great job trying to offload but again she may still do better with the cast. I do not see in the blue-green drainage that we noticed previously I do believe the gentamicin help in this regard. 04/17/2020 on evaluation today patient's wound appears to be doing about the same at this point. There is no significant improvement at this point. No fever chills noted. She is up for put the cast back on the day. That she states in a couple weeks she will need to have this off to go to a workshop. 04/24/2020 on evaluation today patient appears to be doing significantly better in regard to her wound. Fortunately there is no signs of active infection and overall feel like she is making great progress the cast seems to have done excellent for her. 05/01/2020 upon evaluation today patient presents for reevaluation she really does not appear to be doing too badly in regard to the actual wound on the left foot we have been managing. Unfortunately she has bilateral lower extremity edema with blisters between the webspace of her first and second toe  on both feet. She has a tremendous amount of edema in the legs which I think is where this is coming from it does not appear to be infected but nonetheless I do believe this is can be something that needs to be addressed today. Obviously this means we probably will not be putting the cast on at this point. She attributes this to the fact that she was sitting with her feet on the floor much longer during a conference last week she had a great time but unfortunately had a lot of complications as a result. 05/08/2020 upon evaluation today patient appears to be doing somewhat better in regard to her wounds at this time. Fortunately there is no signs of active infection which is great news. With that being said I do believe that the blisters have ruptured and unfortunately did not just reattach I  will remove some of the blistered tissue today. With that being said I do think the wound itself on the plantar aspect of left foot does need to have sharp debridement. 05/15/2020 upon evaluation today patient appears to be doing about the same in regard to her foot ulcer. Unfortunately in the past week her husband had a fall where he sustained a mild traumatic brain bleed. Fortunately he is doing better but being that he was in the hospital she had a walk on this a lot more. The wound does not appear to be any better is also not really appearing to be significantly worse which is good news. There is no signs of active infection at this time. 10/14; patient with a small diabetic wound on the medial part of her left foot. We have been using silver collagen a total contact cast making good progress. I think the patient had a series of blisters on her dorsal foot probably secondary to having her legs recumbent for 3 days while in a conference in [place]. We wrapped her leg last week these are all healed. We did not previously have her in compression on the right leg. 05/29/2020 upon evaluation today patient appears to be doing well with regard to the wound on the plantar aspect of her foot medially. This is measuring smaller and looking much better than last time I saw her. Again when I did see her last was 2 weeks back and the wound was significantly larger. I do believe the cast is helping and I believe the collagen is a good option for her. 06/05/2020 on evaluation today patient appears to be doing well with regard to her foot ulcer this is actually measuring significantly better and overall I feel like she is doing excellent. There is no signs of active infection at this time. 06/12/2020 upon evaluation today patient actually continues to show signs of good improvement which is excellent news. I am extremely pleased with how she seems to be progressing at this point in regard to her wound. There is  still some depth to the wound but I do believe the collagen is helping her quite a bit. 06/19/2020 upon evaluation today patient appears to be doing well with regard to her plantar foot ulcer. She is actually making excellent progress and in fact this appears to be almost completely healed. With that being said I do believe that the patient is going to actually require 1 more week in the cast although after that I am hopeful she will be ready for discharge. 06/26/2020 on evaluation today patient appears to be doing well in regard to her wound  currently. Fortunately there is no signs of active infection in general I feel like she is doing excellent. This appears to be completely healed I think she is ready to come out of the cast. 11/29; patient comes back in the clinic today with a very quick reopening in the exact same area on the medial plantar foot. She had been healed out last time. She went back into some new balance shoes that she got at [employer] with a custom insert. As it turns out this wound also happened when wearing these shoes although there was some modification made I think with the wound initially happened they added some foam around the wound area. This obviously is not going to be sufficient. 07/17/2020 on evaluation today patient appears to be doing well with regard to her wound. Fortunately she seems to be making good progress. Unfortunately she was in the hospital due to a issue with colitis and had just been discharged today in fact. She tells me that her biggest concern here is that a lot of her numbers especially her creatinine were somewhat elevated and problematic. She is can be following up with her provider in order to have a further work-up at this time. With that being said she did need to come back in for a cast change. She did not allow them to remove the cast due to the fact that she did not feel like that was the issue whatsoever and indeed it does not appear that was the  case her wound appears to be doing excellent today. 07/24/2020 upon evaluation today patient appears to be doing well in regard to her foot ulcer. She still has a small opening but this is showing signs of excellent improvement overall but they were very close to complete resolution. No fevers, chills, nausea, vomiting, or diarrhea. 07/31/2020 upon evaluation today patient actually appears to be doing excellent she is actually completely healed this is great news. Fortunately there is no signs of active infection at this time. No fevers, chills, nausea, vomiting, or diarrhea. The patient tells me that she did see Dr. Paulla Dolly in order to get to [place] so that she can have a custom shoe made. She saw him earlier last week. She does have an appointment with Liliane Channel on the sixth I believe on January she tells me Readmission: 08/28/2020 on evaluation today patient appears to be doing poorly in regard to her wound currently. She tells me this has reopened. The good news that she did get measured for and actually her shoes are on the way in from [facility].. With that being said she has been trying to stay off of this is much as possible using her wheelchair around home. Nonetheless has been somewhat difficult. The good news is her shoes should be here next week 09/04/2020 upon evaluation today patient appears to be doing well with regard to her wound. There is a little bit of callus buildup but nothing too significant she does tell me she was very active this week. Fortunately there is no signs of systemic infection at this point. The dorsal foot wound actually appears to be doing better. I think this is healed. 09/11/2020 upon evaluation today patient appears to actually be doing quite well in my opinion based on what I am seeing today. Fortunately there is no signs of infection in fact her mother is certain that this is even open any longer based on what I see. Fortunately I think the patient has been doing  everything she can to try to keep this under control. 09/18/2020 upon evaluation today patient appears to be doing about the same in regard to her foot ulcer. She has been tolerating the dressing changes without complication. Fortunately there is no sign of active infection at this time. No fevers, chills, nausea, vomiting, or diarrhea. She is going require some sharp debridement today. 09/25/2020 upon evaluation today patient appears to be doing well with regard to his wounds she has been tolerating the dressing changes without complication. Her wound appears to be completely healed based on what I am seeing at this point. There does not appear to be any signs of active infection at this time which is great news. No fevers, chills, nausea, vomiting, or diarrhea. She did get the cushion for her foot as well that she ordered to try to help keep pressure off of this area. That looks like it may be very beneficial for her to be honest. 10/02/2020 upon evaluation today patient appears to be doing more poorly in regard to her foot ulcer. Unfortunately this has reopened since we saw her last week. It apparently did not take too long at all for this to happen. She is obviously somewhat disappointed as she was hopeful that that would be time this thing would stay closed. Fortunately there is no evidence of active infection at this point. No fevers, chills, nausea, vomiting, or diarrhea. With that being said I am good have to perform a little bit of sharp debridement clear away some of the debris currently. Including a minimal amount of callus at this time. 10/16/2020 upon evaluation today patient appears to be doing about the same in regard to her foot ulcer unfortunately. There does not appear to be any signs of active infection which is great news. No fever chills noted she has been tolerating the dressing changes without complication which is great news. Unfortunately she tells me that she has been very  depressed about the situation is getting very frustrating to her that she continues to have issues despite everything that she is trying to do to stay off of her foot. She is extremely discouraged and I do hate to hear this. Obviously we have been hopeful that if she got her shoes that would make a difference she is actually can be getting those tomorrow but I am not certain that that alone is good to be enough to get this to heal. We may end up having to consider going back into a total contact cast to get this to heal and then subsequently once we get her healed get her into her new diabetic shoes which will and my hope anyway keep this from reopening again. 10/23/2020 upon evaluation today patient appears to be doing a little worse both in regard to the size of her wound as well as in regard to the fact that she has erythema surrounding the wound and wrap around the lateral part of her foot where she tells me has been somewhat sore. Fortunately there does not appear to be any evidence of infection systemically but locally definitely there is erythema and warmth consistent with local cellulitis. I am glad to remove some of the callus as well. 10/30/2020 upon evaluation today patient appears to be doing well with regard to her wound on the plantar aspect of her foot. This is significantly improved compared to last week. 10/30/2020 upon evaluation today patient actually is making excellent progress her wound appears to show signs of great improvement which  is wonderful and that she is extremely pleased to hear this. She actually leaves Sunday to go on her trip with her half sister and friends. 11/13/2020 on evaluation today patient's wound actually appears to be doing quite well. There does not appear to be any signs of infection it has been 2 weeks since have seen her she does have a little bit of callus buildup here today but at the same time I do not believe the vacation time set her back any just  has not really made a lot of significant improvement. She is done with her antibiotics at this point. 11/20/2020 upon evaluation today patient appears to be doing poorly in regard to her foot. In fact this appears to be showing signs of Infection. She has erythema and warmth that is concerning. I know she is very discouraged as this seems to be a recurrent issue. I think we may need to delve further into the possibility of something deeper going on here as far as a structural infection. 11/27/2020 patient presents for 1 week follow-up. She had a culture of her left foot ulcer that grew staph aureus sensitive to Augmentin. This was called in by [place], [place] this morning. She is currently on doxycycline for previous culture result. Patient also had an x-ray done of her left foot that had conflicting results. We asked for clarification and was told we would have clarification on Monday. Patient states overall she is doing well. She tries to stay off of her foot is much as possible. 12/04/2020 patient presents for 1 week follow-up. She is currently taking Augmentin and tolerating this well. It was confirmed that her x-ray had no acute osseous abnormalities. We switched her to collagen from silver alginate last week. Patient is overall doing well and trying to stay off her foot is much as possible. She has no complaints today. 12/11/2020 upon evaluation today patient appears to be doing well with regard to her wound. She has been tolerating the dressing changes without complication. Fortunately there is no signs of active infection noted at this point. I think she is definitely ready to go back into the cast. 12/18/2020 upon evaluation today patient appears to be doing well with regard to her wound. She has been tolerating the dressing changes without complication. Fortunately there is no signs of active infection at this time. No fevers, chills, nausea, vomiting, or diarrhea. Electronic Signature(s) Signed:  12/18/2020 9:37:25 AM By: Worthy Keeler PA-C Entered By: Worthy Keeler on 12/18/2020 09:37:25 -------------------------------------------------------------------------------- Physical Exam Details Patient Name: Date of Service: [name] [name], [name] RA E. 12/18/2020 8:15 A M Medical Record Number: [id number] Patient Account Number: [id number] Date of Birth/Sex: Treating RN: 1946/01/12 (75 y.o. [name] Other Clinician: Referring Provider: Treating Provider/Extender: Adele Schilder in Treatment: 48 Constitutional Well-nourished and well-hydrated in no acute distress. Respiratory normal breathing without difficulty. Psychiatric this patient is able to make decisions and demonstrates good insight into disease process. Alert and Oriented x 3. pleasant and cooperative. Notes Upon inspection patient's wound bed actually showed some signs of minimal callus buildup. There was a little bit of slough noted as well. However the wound itself is measuring significantly better which is great news overall very pleased with where things stand today. I am going to reapply the total contact cast today as well. Electronic Signature(s) Signed: 12/18/2020 9:37:44 AM By: Worthy Keeler PA-C Entered By: Worthy Keeler on 12/18/2020 09:37:44 -------------------------------------------------------------------------------- Physician  Orders Details Patient Name: Date of Service: MARABELLE, [name] E. 12/18/2020 8:15 A M Medical Record Number: [id number] Patient Account Number: [id number] Date of Birth/Sex: Treating RN: 04/02/1946 (75 y.o. [name] Other Clinician: Referring Provider: Treating Provider/Extender: Adele Schilder in Treatment: 45 Verbal / Phone Orders: No Diagnosis Coding ICD-10 Coding Code Description E11.621 Type 2 diabetes mellitus with foot ulcer L97.522  Non-pressure chronic ulcer of other part of left foot with fat layer exposed M14.672 Charcot's joint, left ankle and foot E11.40 Type 2 diabetes mellitus with diabetic neuropathy, unspecified Follow-up Appointments ppointment in 1 week. - with Margarita Grizzle - extra time for cast Return A Bathing/ Shower/ Hygiene May shower with protection but do not get wound dressing(s) wet. Off-Loading Total Contact Cast to Left Lower Extremity Additional Orders / Instructions Follow Nutritious Diet Wound Treatment Wound #1RRR - Foot Wound Laterality: Plantar, Left, Medial Prim Dressing: Promogran Prisma Matrix, 4.34 (sq in) (silver collagen) Every Other Day/30 Days ary Discharge Instructions: Moisten collagen with saline or hydrogel Secondary Dressing: Woven Gauze Sponge, Non-Sterile 4x4 in (Generic) Every Other Day/30 Days Discharge Instructions: Apply over primary dressing as directed. Secondary Dressing: Optifoam Non-Adhesive Dressing, 4x4 in Every Other Day/30 Days Discharge Instructions: Apply over primary dressing cut to make foam donut Secured With: 53M [employer] Surgical Tape, 2x2 (in/yd) (Generic) Every Other Day/30 Days Discharge Instructions: Secure dressing with tape as directed. Electronic Signature(s) Signed: 12/18/2020 5:37:47 PM By: Worthy Keeler PA-C Signed: 12/18/2020 6:13:52 PM By: [name] RN, BSN Entered By: [name] on 12/18/2020 09:18:21 -------------------------------------------------------------------------------- Problem List Details Patient Name: Date of Service: [name] Guarneri RBA RA E. 12/18/2020 8:15 A M Medical Record Number: [id number] Patient Account Number: [id number] Date of Birth/Sex: Treating RN: May 25, 1946 (75 y.o. [name]: Other Clinician: [name] Referring Provider: Treating Provider/Extender: Adele Schilder in Treatment: 62 Active Problems ICD-10 Encounter Code Description Active  Date MDM Diagnosis E11.621 Type 2 diabetes mellitus with foot ulcer 01/17/2020 No Yes L97.522 Non-pressure chronic ulcer of other part of left foot with fat layer exposed 01/17/2020 No Yes M14.672 Charcot's joint, left ankle and foot 01/17/2020 No Yes E11.40 Type 2 diabetes mellitus with diabetic neuropathy, unspecified 01/17/2020 No Yes Inactive Problems ICD-10 Code Description Active Date Inactive Date I10 Essential (primary) hypertension 01/17/2020 01/17/2020 Resolved Problems Electronic Signature(s) Signed: 12/18/2020 9:11:37 AM By: Worthy Keeler PA-C Entered By: Worthy Keeler on 12/18/2020 09:11:36 -------------------------------------------------------------------------------- Progress Note Details Patient Name: Date of Service: [name] [name], [name] 12/18/2020 8:15 A M Medical Record Number: [id number] Patient Account Number: [id number] Date of Birth/Sex: Treating RN: [birth year] (75 y.o. [name] Other Clinician: Referring Provider: Treating Provider/Extender: Adele Schilder in Treatment: 21 Subjective Chief Complaint Information obtained from Patient Left foot ulcer History of Present Illness (HPI) 01/17/2020 upon evaluation today patient presents for initial evaluation here in our clinic concerning issues she has been having with a left medial/plantar foot ulcer. This is actually been an issue for her since October 2020. She has been seeing Dr. Doran Durand for quite some time during that course. Fortunately there is no signs of active infection at this time. Or least no mention of this to have seen in general. With that being said unfortunately I do see some signs of erythema noted today that does have me concerned about the possibility of infection at  this point in the surrounding area of the wound. There is also a warm to touch at the site which is somewhat concerning. Fortunately there is no evidence of systemic infection  which is great news. The patient does have a history of diabetes mellitus type 2, Charcot foot which is what led to the wound, and hypertension. She notes that she was in a cast for some time with Dr. Doran Durand for about 8 weeks. During that time they were utilizing according to the patient silver nitrate along with a foam doughnut and then Coban to secure in place in the cast in place. With that being said I do not have the actual records to review we are going to try to get a hold of those unfortunately they would not flow over into care everywhere I did look today. She has been seeing Dr. Doran Durand and his physician assistant Larkin Ina up until the end of May and apparently is still seeing them on a regular basis every 2 weeks roughly. She has also tried Iodosorb without effect here. 01/24/2020 upon evaluation today patient actually appears to be doing quite well with regard to her wounds. She has been tolerating the dressing changes without complication. Fortunately there is no signs of active infection spreading which is good news. Her culture did show signs of Staph aureus I did place her on Augmentin due to the erythema surrounding the wound. With that being said the wound does appear to be doing better she has her longer walking cast/boot and I think that is actually good for her for the time being. I am considering reinitiating total contact cast when she gets back from vacation but next week she will actually be out of town at [employer] she knows not to get in the water but she still obviously is planning to enjoy herself she is going to take it easy on her foot however. 02/07/2020 upon evaluation today patient appears to be doing fairly well in regard to her ulcer on her foot. Fortunately there is no signs of severe infection at this time which is great news and overall very pleased in that regard. With that being said I do think that she could still benefit from a total contact cast. Nonetheless she  is using her walking boot which at least provide some protection and that it prevents some of the friction occurring when she is ambulating. 02/14/2020 upon evaluation today patient appears to be doing well with regard to her foot ulcer. This is actually measuring a little bit smaller yet again this week. Overall very pleased with where things stand and I do not see any signs of active infection at this time which is also good news. Since she is measuring better the patient has wanting to somewhat hold off on proceeding with the total contact cast which I think is reasonable at this point. 02/28/2020 on evaluation today patient appears to be doing well in general in regard to her wound although she has a lot of callus buildup as compared to last time I saw her. This is can require sharp debridement today. I do believe she really needs the total contact cast as well which we have discussed previous. 7/23; patient comes in for a total contact cast change 03/06/2020 on evaluation today patient appears to be doing quite well with regard to her wounds. Fortunately the wound bed is measuring smaller and looking much better there is little callus noted although there is some debridement necessary today. 03/13/2020 on  evaluation today patient's wound actually appears to be doing excellent which is great news. With that being said unfortunately she is having some issues currently with her left leg where she does have cellulitis it appears. This may have come from an area that rubbed underneath the cast from last week that we noted we padded that area and it looks to be doing excellent at this point but nonetheless the leg was somewhat painful, swollen, and somewhat erythematous. She also had an elevated white blood cell count of 11.5 based on what I saw on looking at her records from the med center in [facility] from where she was seen yesterday. Unfortunately with [place] having a provider on vacation there was no one  here in the clinic in the afternoon when she called therefore she went to the ER as advised. Subsequently they did not cut off the cast as they did not have anyone from orthopedics there to do so and subsequently also did not have the ability to do the Doppler for evaluation of DVT They recommended therefore given her dose of Eliquis as well as . Augmentin and sent her home to come see [place] today to have the cast taken off and then she is supposed to go back to have the study for DVT performed they are following. 03/20/2020 upon evaluation today patient appears to be doing well with regard to her foot all things considered we have not been able to use the total contact cast due to the infection that she had last week. She has been on the doxycycline and she had a 10-day supply of that I do believe that is helping and her leg appears to be doing better. With that being said there is fortunately no signs of active infection systemically at this time which is good news. No fevers, chills, nausea, vomiting, or diarrhea. 03/27/2020 upon evaluation today patient appears to be doing well with regard to her foot ulcer. There does not appear to be signs of active infection which is great news. Overall I am very pleased with where things stand at this point. 04/03/2020 upon evaluation today patient appears to be doing pretty well in regard to the overall appearance of her wound. Fortunately there is no signs of active infection at this time which is great news. No fevers, chills, nausea, vomiting, or diarrhea. With that being said she does have some blue-green drainage that actually is a little bit concerning to me for the possibility of Pseudomonas. I discussed that with the patient today. With that being said I do believe that we may be able to manage this however with the topical antibiotic cream as opposed to having to do anything oral especially since she seems to be doing so well with overall appearance of  the wound. 04/10/2020 on evaluation today patient appears to be doing about the same roughly in regard to the overall size of her wound. With that being said she fortunately has not shown any signs of worsening overall which is good news. I do believe that she is doing a great job trying to offload but again she may still do better with the cast. I do not see in the blue-green drainage that we noticed previously I do believe the gentamicin help in this regard. 04/17/2020 on evaluation today patient's wound appears to be doing about the same at this point. There is no significant improvement at this point. No fever chills noted. She is up for put the cast back on  the day. That she states in a couple weeks she will need to have this off to go to a workshop. 04/24/2020 on evaluation today patient appears to be doing significantly better in regard to her wound. Fortunately there is no signs of active infection and overall feel like she is making great progress the cast seems to have done excellent for her. 05/01/2020 upon evaluation today patient presents for reevaluation she really does not appear to be doing too badly in regard to the actual wound on the left foot we have been managing. Unfortunately she has bilateral lower extremity edema with blisters between the webspace of her first and second toe on both feet. She has a tremendous amount of edema in the legs which I think is where this is coming from it does not appear to be infected but nonetheless I do believe this is can be something that needs to be addressed today. Obviously this means we probably will not be putting the cast on at this point. She attributes this to the fact that she was sitting with her feet on the floor much longer during a conference last week she had a great time but unfortunately had a lot of complications as a result. 05/08/2020 upon evaluation today patient appears to be doing somewhat better in regard to her wounds at this  time. Fortunately there is no signs of active infection which is great news. With that being said I do believe that the blisters have ruptured and unfortunately did not just reattach I will remove some of the blistered tissue today. With that being said I do think the wound itself on the plantar aspect of left foot does need to have sharp debridement. 05/15/2020 upon evaluation today patient appears to be doing about the same in regard to her foot ulcer. Unfortunately in the past week her husband had a fall where he sustained a mild traumatic brain bleed. Fortunately he is doing better but being that he was in the hospital she had a walk on this a lot more. The wound does not appear to be any better is also not really appearing to be significantly worse which is good news. There is no signs of active infection at this time. 10/14; patient with a small diabetic wound on the medial part of her left foot. We have been using silver collagen a total contact cast making good progress. I think the patient had a series of blisters on her dorsal foot probably secondary to having her legs recumbent for 3 days while in a conference in [place]. We wrapped her leg last week these are all healed. We did not previously have her in compression on the right leg. 05/29/2020 upon evaluation today patient appears to be doing well with regard to the wound on the plantar aspect of her foot medially. This is measuring smaller and looking much better than last time I saw her. Again when I did see her last was 2 weeks back and the wound was significantly larger. I do believe the cast is helping and I believe the collagen is a good option for her. 06/05/2020 on evaluation today patient appears to be doing well with regard to her foot ulcer this is actually measuring significantly better and overall I feel like she is doing excellent. There is no signs of active infection at this time. 06/12/2020 upon evaluation today patient  actually continues to show signs of good improvement which is excellent news. I am extremely pleased with how she  seems to be progressing at this point in regard to her wound. There is still some depth to the wound but I do believe the collagen is helping her quite a bit. 06/19/2020 upon evaluation today patient appears to be doing well with regard to her plantar foot ulcer. She is actually making excellent progress and in fact this appears to be almost completely healed. With that being said I do believe that the patient is going to actually require 1 more week in the cast although after that I am hopeful she will be ready for discharge. 06/26/2020 on evaluation today patient appears to be doing well in regard to her wound currently. Fortunately there is no signs of active infection in general I feel like she is doing excellent. This appears to be completely healed I think she is ready to come out of the cast. 11/29; patient comes back in the clinic today with a very quick reopening in the exact same area on the medial plantar foot. She had been healed out last time. She went back into some new balance shoes that she got at [employer] with a custom insert. As it turns out this wound also happened when wearing these shoes although there was some modification made I think with the wound initially happened they added some foam around the wound area. This obviously is not going to be sufficient. 07/17/2020 on evaluation today patient appears to be doing well with regard to her wound. Fortunately she seems to be making good progress. Unfortunately she was in the hospital due to a issue with colitis and had just been discharged today in fact. She tells me that her biggest concern here is that a lot of her numbers especially her creatinine were somewhat elevated and problematic. She is can be following up with her provider in order to have a further work-up at this time. With that being said she did need to come  back in for a cast change. She did not allow them to remove the cast due to the fact that she did not feel like that was the issue whatsoever and indeed it does not appear that was the case her wound appears to be doing excellent today. 07/24/2020 upon evaluation today patient appears to be doing well in regard to her foot ulcer. She still has a small opening but this is showing signs of excellent improvement overall but they were very close to complete resolution. No fevers, chills, nausea, vomiting, or diarrhea. 07/31/2020 upon evaluation today patient actually appears to be doing excellent she is actually completely healed this is great news. Fortunately there is no signs of active infection at this time. No fevers, chills, nausea, vomiting, or diarrhea. The patient tells me that she did see Dr. Paulla Dolly in order to get to [place] so that she can have a custom shoe made. She saw him earlier last week. She does have an appointment with Liliane Channel on the sixth I believe on January she tells me Readmission: 08/28/2020 on evaluation today patient appears to be doing poorly in regard to her wound currently. She tells me this has reopened. The good news that she did get measured for and actually her shoes are on the way in from [facility].. With that being said she has been trying to stay off of this is much as possible using her wheelchair around home. Nonetheless has been somewhat difficult. The good news is her shoes should be here next week 09/04/2020 upon evaluation today patient appears  to be doing well with regard to her wound. There is a little bit of callus buildup but nothing too significant she does tell me she was very active this week. Fortunately there is no signs of systemic infection at this point. The dorsal foot wound actually appears to be doing better. I think this is healed. 09/11/2020 upon evaluation today patient appears to actually be doing quite well in my opinion based on what I am seeing  today. Fortunately there is no signs of infection in fact her mother is certain that this is even open any longer based on what I see. Fortunately I think the patient has been doing everything she can to try to keep this under control. 09/18/2020 upon evaluation today patient appears to be doing about the same in regard to her foot ulcer. She has been tolerating the dressing changes without complication. Fortunately there is no sign of active infection at this time. No fevers, chills, nausea, vomiting, or diarrhea. She is going require some sharp debridement today. 09/25/2020 upon evaluation today patient appears to be doing well with regard to his wounds she has been tolerating the dressing changes without complication. Her wound appears to be completely healed based on what I am seeing at this point. There does not appear to be any signs of active infection at this time which is great news. No fevers, chills, nausea, vomiting, or diarrhea. She did get the cushion for her foot as well that she ordered to try to help keep pressure off of this area. That looks like it may be very beneficial for her to be honest. 10/02/2020 upon evaluation today patient appears to be doing more poorly in regard to her foot ulcer. Unfortunately this has reopened since we saw her last week. It apparently did not take too long at all for this to happen. She is obviously somewhat disappointed as she was hopeful that that would be time this thing would stay closed. Fortunately there is no evidence of active infection at this point. No fevers, chills, nausea, vomiting, or diarrhea. With that being said I am good have to perform a little bit of sharp debridement clear away some of the debris currently. Including a minimal amount of callus at this time. 10/16/2020 upon evaluation today patient appears to be doing about the same in regard to her foot ulcer unfortunately. There does not appear to be any signs of active infection which  is great news. No fever chills noted she has been tolerating the dressing changes without complication which is great news. Unfortunately she tells me that she has been very depressed about the situation is getting very frustrating to her that she continues to have issues despite everything that she is trying to do to stay off of her foot. She is extremely discouraged and I do hate to hear this. Obviously we have been hopeful that if she got her shoes that would make a difference she is actually can be getting those tomorrow but I am not certain that that alone is good to be enough to get this to heal. We may end up having to consider going back into a total contact cast to get this to heal and then subsequently once we get her healed get her into her new diabetic shoes which will and my hope anyway keep this from reopening again. 10/23/2020 upon evaluation today patient appears to be doing a little worse both in regard to the size of her wound as well as in  regard to the fact that she has erythema surrounding the wound and wrap around the lateral part of her foot where she tells me has been somewhat sore. Fortunately there does not appear to be any evidence of infection systemically but locally definitely there is erythema and warmth consistent with local cellulitis. I am glad to remove some of the callus as well. 10/30/2020 upon evaluation today patient appears to be doing well with regard to her wound on the plantar aspect of her foot. This is significantly improved compared to last week. 10/30/2020 upon evaluation today patient actually is making excellent progress her wound appears to show signs of great improvement which is wonderful and that she is extremely pleased to hear this. She actually leaves Sunday to go on her trip with her half sister and friends. 11/13/2020 on evaluation today patient's wound actually appears to be doing quite well. There does not appear to be any signs of infection it has  been 2 weeks since have seen her she does have a little bit of callus buildup here today but at the same time I do not believe the vacation time set her back any just has not really made a lot of significant improvement. She is done with her antibiotics at this point. 11/20/2020 upon evaluation today patient appears to be doing poorly in regard to her foot. In fact this appears to be showing signs of Infection. She has erythema and warmth that is concerning. I know she is very discouraged as this seems to be a recurrent issue. I think we may need to delve further into the possibility of something deeper going on here as far as a structural infection. 11/27/2020 patient presents for 1 week follow-up. She had a culture of her left foot ulcer that grew staph aureus sensitive to Augmentin. This was called in by [place], [place] this morning. She is currently on doxycycline for previous culture result. Patient also had an x-ray done of her left foot that had conflicting results. We asked for clarification and was told we would have clarification on Monday. Patient states overall she is doing well. She tries to stay off of her foot is much as possible. 12/04/2020 patient presents for 1 week follow-up. She is currently taking Augmentin and tolerating this well. It was confirmed that her x-ray had no acute osseous abnormalities. We switched her to collagen from silver alginate last week. Patient is overall doing well and trying to stay off her foot is much as possible. She has no complaints today. 12/11/2020 upon evaluation today patient appears to be doing well with regard to her wound. She has been tolerating the dressing changes without complication. Fortunately there is no signs of active infection noted at this point. I think she is definitely ready to go back into the cast. 12/18/2020 upon evaluation today patient appears to be doing well with regard to her wound. She has been tolerating the dressing changes without  complication. Fortunately there is no signs of active infection at this time. No fevers, chills, nausea, vomiting, or diarrhea. Objective Constitutional Well-nourished and well-hydrated in no acute distress. Vitals Time Taken: 8:24 AM, Height: 66 in, Weight: 245 lbs, BMI: 39.5, Temperature: 98.5 F, Pulse: 74 bpm, Respiratory Rate: 17 breaths/min, Blood Pressure: 138/65 mmHg. Respiratory normal breathing without difficulty. Psychiatric this patient is able to make decisions and demonstrates good insight into disease process. Alert and Oriented x 3. pleasant and cooperative. General Notes: Upon inspection patient's wound bed actually showed some signs of  minimal callus buildup. There was a little bit of slough noted as well. However the wound itself is measuring significantly better which is great news overall very pleased with where things stand today. I am going to reapply the total contact cast today as well. Integumentary (Hair, Skin) Wound #1RRR status is Open. Original cause of wound was Gradually Appeared. The date acquired was: 05/11/2019. The wound has been in treatment 48 weeks. The wound is located on the [place]. The wound measures 0.3cm length x 0.3cm width x 0.2cm depth; 0.071cm^2 area and 0.014cm^3 volume. There is Fat Layer (Subcutaneous Tissue) exposed. There is no tunneling or undermining noted. There is a medium amount of serosanguineous drainage noted. The wound margin is thickened. There is large (67-100%) red, pink granulation within the wound bed. There is a small (1-33%) amount of necrotic tissue within the wound bed including Adherent Slough. Assessment Active Problems ICD-10 Type 2 diabetes mellitus with foot ulcer Non-pressure chronic ulcer of other part of left foot with fat layer exposed Charcot's joint, left ankle and foot Type 2 diabetes mellitus with diabetic neuropathy, unspecified Procedures Wound #1RRR Pre-procedure diagnosis of Wound  #1RRR is a Diabetic Wound/Ulcer of the Lower Extremity located on the Left,Medial,Plantar Foot .Severity of Tissue Pre Debridement is: Fat layer exposed. There was a Excisional Skin/Subcutaneous Tissue Debridement with a total area of 1 sq cm performed by Worthy Keeler, PA. With the following instrument(s): Curette to remove Viable and Non-Viable tissue/material. Material removed includes Callus, Subcutaneous Tissue, and Skin: Epidermis after achieving pain control using Other (benzocaine 20% spray). No specimens were taken. A time out was conducted at 09:10, prior to the start of the procedure. A Minimum amount of bleeding was controlled with Pressure. The procedure was tolerated well with a pain level of 0 throughout and a pain level of 0 following the procedure. Post Debridement Measurements: 0.7cm length x 0.6cm width x 0.1cm depth; 0.033cm^3 volume. Character of Wound/Ulcer Post Debridement is improved. Severity of Tissue Post Debridement is: Fat layer exposed. Post procedure Diagnosis Wound #1RRR: Same as Pre-Procedure Pre-procedure diagnosis of Wound #1RRR is a Diabetic Wound/Ulcer of the Lower Extremity located on the Left,Medial,Plantar Foot . There was a Total Contact Cast Procedure by Worthy Keeler, PA. Post procedure Diagnosis Wound #1RRR: Same as Pre-Procedure Plan Follow-up Appointments: Return Appointment in 1 week. - with Margarita Grizzle - extra time for cast Bathing/ Shower/ Hygiene: May shower with protection but do not get wound dressing(s) wet. Off-Loading: T Contact Cast to Left Lower Extremity otal Additional Orders / Instructions: Follow Nutritious Diet WOUND #1RRR: - Foot Wound Laterality: Plantar, Left, Medial Prim Dressing: Promogran Prisma Matrix, 4.34 (sq in) (silver collagen) Every Other Day/30 Days ary Discharge Instructions: Moisten collagen with saline or hydrogel Secondary Dressing: Woven Gauze Sponge, Non-Sterile 4x4 in (Generic) Every Other Day/30 Days Discharge  Instructions: Apply over primary dressing as directed. Secondary Dressing: Optifoam Non-Adhesive Dressing, 4x4 in Every Other Day/30 Days Discharge Instructions: Apply over primary dressing cut to make foam donut Secured With: 60M [place] [employer] T ape, 2x2 (in/yd) (Generic) Every Other Day/30 Days Discharge Instructions: Secure dressing with tape as directed. 1. Would recommend currently that going continue with the wound care measures as before and the patient is in agreement the plan this includes the use of the silver collagen dressing to the wound bed I think this to do the job. 2. I am also can recommend that we continue with the foam to cover. 3. I  am also going to suggest that we continue with the total contact cast which I think is doing a great job for the patient. We will see patient back for reevaluation in 1 week here in the clinic. If anything worsens or changes patient will contact our office for additional recommendations. Electronic Signature(s) Signed: 12/18/2020 9:38:10 AM By: Worthy Keeler PA-C Entered By: Worthy Keeler on 12/18/2020 09:38:10 -------------------------------------------------------------------------------- Total Contact Cast Details Patient Name: Date of Service: [name] [name], [name] E. 12/18/2020 8:15 A M Medical Record Number: [id number] Patient Account Number: [id number] Date of Birth/Sex: Treating RN: 14-Nov-1945 (75 y.o. [name] Other Clinician: Referring Provider: Treating Provider/Extender: Adele Schilder in Treatment: [phone] T Contact Cast Applied for Wound Assessment: otal Wound #1RRR Left,Medial,Plantar Foot Performed By: Physician Worthy Keeler, PA Post Procedure Diagnosis Same as Pre-procedure Electronic Signature(s) Signed: 12/18/2020 5:37:47 PM By: Worthy Keeler PA-C Signed: 12/18/2020 6:13:52 PM By: [name] RN, BSN Entered By: [name] on  12/18/2020 09:16:55 -------------------------------------------------------------------------------- SuperBill Details Patient Name: Date of Service: [name] Guarneri RBA RA E. 12/18/2020 Medical Record Number: [id number] Patient Account Number: [id number] Date of Birth/Sex: Treating RN: [birth year] (75 y.o. [name] Other Clinician: Referring Provider: Treating Provider/Extender: Adele Schilder in Treatment: 48 Diagnosis Coding ICD-10 Codes Code Description E11.621 Type 2 diabetes mellitus with foot ulcer L97.522 Non-pressure chronic ulcer of other part of left foot with fat layer exposed M14.672 Charcot's joint, left ankle and foot E11.40 Type 2 diabetes mellitus with diabetic neuropathy, unspecified Facility Procedures CPT4 Code: [id number] Description: [id number] - DEB SUBQ TISSUE 20 SQ CM/< ICD-10 Diagnosis Description L97.522 Non-pressure chronic ulcer of other part of left foot with fat layer exposed Modifier: Quantity: 1 Physician Procedures : CPT4 Code Description Modifier [id number] - WC PHYS SUBQ TISS 20 SQ CM ICD-10 Diagnosis Description L97.522 Non-pressure chronic ulcer of other part of left foot with fat layer exposed Quantity: 1 Electronic Signature(s) Signed: 12/18/2020 9:39:13 AM By: Worthy Keeler PA-C Entered By: Worthy Keeler on 12/18/2020 09:39:13

## 2020-12-18 NOTE — Addendum Note (Signed)
Addended by: Binnie Rail on: 12/18/2020 09:22 PM   Modules accepted: Orders

## 2020-12-21 ENCOUNTER — Other Ambulatory Visit: Payer: Self-pay | Admitting: Internal Medicine

## 2020-12-22 ENCOUNTER — Other Ambulatory Visit: Payer: Self-pay | Admitting: Internal Medicine

## 2020-12-25 ENCOUNTER — Encounter (HOSPITAL_BASED_OUTPATIENT_CLINIC_OR_DEPARTMENT_OTHER): Payer: Medicare Other | Admitting: Physician Assistant

## 2020-12-25 ENCOUNTER — Telehealth: Payer: Self-pay | Admitting: Podiatry

## 2020-12-25 ENCOUNTER — Other Ambulatory Visit: Payer: Self-pay

## 2020-12-25 ENCOUNTER — Encounter: Payer: Self-pay | Admitting: Internal Medicine

## 2020-12-25 DIAGNOSIS — E785 Hyperlipidemia, unspecified: Secondary | ICD-10-CM | POA: Diagnosis not present

## 2020-12-25 DIAGNOSIS — E1140 Type 2 diabetes mellitus with diabetic neuropathy, unspecified: Secondary | ICD-10-CM | POA: Diagnosis not present

## 2020-12-25 DIAGNOSIS — L97522 Non-pressure chronic ulcer of other part of left foot with fat layer exposed: Secondary | ICD-10-CM | POA: Diagnosis not present

## 2020-12-25 DIAGNOSIS — M14672 Charcot's joint, left ankle and foot: Secondary | ICD-10-CM | POA: Diagnosis not present

## 2020-12-25 DIAGNOSIS — I10 Essential (primary) hypertension: Secondary | ICD-10-CM | POA: Diagnosis not present

## 2020-12-25 DIAGNOSIS — E11621 Type 2 diabetes mellitus with foot ulcer: Secondary | ICD-10-CM | POA: Diagnosis not present

## 2020-12-25 NOTE — Progress Notes (Addendum)
MADDOX, BELLEAU ([id number]) Visit Report for 12/25/2020 Chief Complaint Document Details Patient Name: Date of Service: [name] [name], [name] E. 12/25/2020 8:15 A M Medical Record Number: [id number] Patient Account Number: [id number] Date of Birth/Sex: Treating RN: 26-Jun-1946 (75 y.o. [name] Other Clinician: Referring Provider: Treating Provider/Extender: Adele Schilder in Treatment: 85 Information Obtained from: Patient Chief Complaint Left foot ulcer Electronic Signature(s) Signed: 12/25/2020 8:25:44 AM By: Worthy Keeler PA-C Entered By: Worthy Keeler on 12/25/2020 08:25:43 -------------------------------------------------------------------------------- Debridement Details Patient Name: Date of Service: [name] Amato RA E. 12/25/2020 8:15 A M Medical Record Number: [id number] Patient Account Number: [id number] Date of Birth/Sex: Treating RN: 04-Jun-1946 (75 y.o. [name] Other Clinician: Referring Provider: Treating Provider/Extender: Adele Schilder in Treatment: 49 Debridement Performed for Assessment: Wound #[id number] Left,Medial,Plantar Foot Performed By: Physician Worthy Keeler, PA Debridement Type: Debridement Severity of Tissue Pre Debridement: Fat layer exposed Level of Consciousness (Pre-procedure): Awake and Alert Pre-procedure Verification/Time Out Yes - 08:25 Taken: Start Time: 08:26 T Area Debrided (L x W): otal 2.5 (cm) x 2.5 (cm) = 6.25 (cm) Tissue and other material debrided: Non-Viable, Callus, Skin: Epidermis Level: Skin/Epidermis Debridement Description: Selective/Open Wound Instrument: Curette Bleeding: None End Time: 08:30 Procedural Pain: 0 Post Procedural Pain: 0 Response to Treatment: Procedure was tolerated well Level of Consciousness (Post- Awake and Alert procedure): Post Debridement Measurements of Total  Wound Length: (cm) 0.1 Width: (cm) 0.1 Depth: (cm) 0.1 Volume: (cm) 0.001 Character of Wound/Ulcer Post Debridement: Improved Severity of Tissue Post Debridement: Fat layer exposed Post Procedure Diagnosis Same as Pre-procedure Electronic Signature(s) Signed: 12/25/2020 1:22:22 PM By: Worthy Keeler PA-C Signed: 12/25/2020 6:10:17 PM By: [name] RN, BSN Entered By: [name] on 12/25/2020 08:30:43 -------------------------------------------------------------------------------- HPI Details Patient Name: Date of Service: [name] [name], [name] RA E. 12/25/2020 8:15 A M Medical Record Number: [id number] Patient Account Number: [id number] Date of Birth/Sex: Treating RN: 1946/01/07 (75 y.o. [name] Other Clinician: Referring Provider: Treating Provider/Extender: Adele Schilder in Treatment: 75 History of Present Illness HPI Description: 01/17/2020 upon evaluation today patient presents for initial evaluation here in our clinic concerning issues she has been having with a left medial/plantar foot ulcer. This is actually been an issue for her since October 2020. She has been seeing Dr. Doran Durand for quite some time during that course. Fortunately there is no signs of active infection at this time. Or least no mention of this to have seen in general. With that being said unfortunately I do see some signs of erythema noted today that does have me concerned about the possibility of infection at this point in the surrounding area of the wound. There is also a warm to touch at the site which is somewhat concerning. Fortunately there is no evidence of systemic infection which is great news. The patient does have a history of diabetes mellitus type 2, Charcot foot which is what led to the wound, and hypertension. She notes that she was in a cast for some time with Dr. Doran Durand for about 8 weeks. During that time they were utilizing  according to the patient silver nitrate along with a foam doughnut and then Coban to secure in place in the cast in place. With that being said I do not have the actual records to review we are going to try to get  a hold of those unfortunately they would not flow over into care everywhere I did look today. She has been seeing Dr. Doran Durand and his physician assistant Larkin Ina up until the end of May and apparently is still seeing them on a regular basis every 2 weeks roughly. She has also tried Iodosorb without effect here. 01/24/2020 upon evaluation today patient actually appears to be doing quite well with regard to her wounds. She has been tolerating the dressing changes without complication. Fortunately there is no signs of active infection spreading which is good news. Her culture did show signs of Staph aureus I did place her on Augmentin due to the erythema surrounding the wound. With that being said the wound does appear to be doing better she has her longer walking cast/boot and I think that is actually good for her for the time being. I am considering reinitiating total contact cast when she gets back from vacation but next week she will actually be out of town at [employer] she knows not to get in the water but she still obviously is planning to enjoy herself she is going to take it easy on her foot however. 02/07/2020 upon evaluation today patient appears to be doing fairly well in regard to her ulcer on her foot. Fortunately there is no signs of severe infection at this time which is great news and overall very pleased in that regard. With that being said I do think that she could still benefit from a total contact cast. Nonetheless she is using her walking boot which at least provide some protection and that it prevents some of the friction occurring when she is ambulating. 02/14/2020 upon evaluation today patient appears to be doing well with regard to her foot ulcer. This is actually measuring a  little bit smaller yet again this week. Overall very pleased with where things stand and I do not see any signs of active infection at this time which is also good news. Since she is measuring better the patient has wanting to somewhat hold off on proceeding with the total contact cast which I think is reasonable at this point. 02/28/2020 on evaluation today patient appears to be doing well in general in regard to her wound although she has a lot of callus buildup as compared to last time I saw her. This is can require sharp debridement today. I do believe she really needs the total contact cast as well which we have discussed previous. 7/23; patient comes in for a total contact cast change 03/06/2020 on evaluation today patient appears to be doing quite well with regard to her wounds. Fortunately the wound bed is measuring smaller and looking much better there is little callus noted although there is some debridement necessary today. 03/13/2020 on evaluation today patient's wound actually appears to be doing excellent which is great news. With that being said unfortunately she is having some issues currently with her left leg where she does have cellulitis it appears. This may have come from an area that rubbed underneath the cast from last week that we noted we padded that area and it looks to be doing excellent at this point but nonetheless the leg was somewhat painful, swollen, and somewhat erythematous. She also had an elevated white blood cell count of 11.5 based on what I saw on looking at her records from the med center in [facility] from where she was seen yesterday. Unfortunately with [place] having a provider on vacation there was no one  here in the clinic in the afternoon when she called therefore she went to the ER as advised. Subsequently they did not cut off the cast as they did not have anyone from orthopedics there to do so and subsequently also did not have the ability to do the Doppler for  evaluation of DVT They recommended therefore given her dose of Eliquis as well as . Augmentin and sent her home to come see [place] today to have the cast taken off and then she is supposed to go back to have the study for DVT performed they are following. 03/20/2020 upon evaluation today patient appears to be doing well with regard to her foot all things considered we have not been able to use the total contact cast due to the infection that she had last week. She has been on the doxycycline and she had a 10-day supply of that I do believe that is helping and her leg appears to be doing better. With that being said there is fortunately no signs of active infection systemically at this time which is good news. No fevers, chills, nausea, vomiting, or diarrhea. 03/27/2020 upon evaluation today patient appears to be doing well with regard to her foot ulcer. There does not appear to be signs of active infection which is great news. Overall I am very pleased with where things stand at this point. 04/03/2020 upon evaluation today patient appears to be doing pretty well in regard to the overall appearance of her wound. Fortunately there is no signs of active infection at this time which is great news. No fevers, chills, nausea, vomiting, or diarrhea. With that being said she does have some blue-green drainage that actually is a little bit concerning to me for the possibility of Pseudomonas. I discussed that with the patient today. With that being said I do believe that we may be able to manage this however with the topical antibiotic cream as opposed to having to do anything oral especially since she seems to be doing so well with overall appearance of the wound. 04/10/2020 on evaluation today patient appears to be doing about the same roughly in regard to the overall size of her wound. With that being said she fortunately has not shown any signs of worsening overall which is good news. I do believe that she is doing  a great job trying to offload but again she may still do better with the cast. I do not see in the blue-green drainage that we noticed previously I do believe the gentamicin help in this regard. 04/17/2020 on evaluation today patient's wound appears to be doing about the same at this point. There is no significant improvement at this point. No fever chills noted. She is up for put the cast back on the day. That she states in a couple weeks she will need to have this off to go to a workshop. 04/24/2020 on evaluation today patient appears to be doing significantly better in regard to her wound. Fortunately there is no signs of active infection and overall feel like she is making great progress the cast seems to have done excellent for her. 05/01/2020 upon evaluation today patient presents for reevaluation she really does not appear to be doing too badly in regard to the actual wound on the left foot we have been managing. Unfortunately she has bilateral lower extremity edema with blisters between the webspace of her first and second toe on both feet. She has a tremendous amount of edema in the  legs which I think is where this is coming from it does not appear to be infected but nonetheless I do believe this is can be something that needs to be addressed today. Obviously this means we probably will not be putting the cast on at this point. She attributes this to the fact that she was sitting with her feet on the floor much longer during a conference last week she had a great time but unfortunately had a lot of complications as a result. 05/08/2020 upon evaluation today patient appears to be doing somewhat better in regard to her wounds at this time. Fortunately there is no signs of active infection which is great news. With that being said I do believe that the blisters have ruptured and unfortunately did not just reattach I will remove some of the blistered tissue today. With that being said I do think the  wound itself on the plantar aspect of left foot does need to have sharp debridement. 05/15/2020 upon evaluation today patient appears to be doing about the same in regard to her foot ulcer. Unfortunately in the past week her husband had a fall where he sustained a mild traumatic brain bleed. Fortunately he is doing better but being that he was in the hospital she had a walk on this a lot more. The wound does not appear to be any better is also not really appearing to be significantly worse which is good news. There is no signs of active infection at this time. 10/14; patient with a small diabetic wound on the medial part of her left foot. We have been using silver collagen a total contact cast making good progress. I think the patient had a series of blisters on her dorsal foot probably secondary to having her legs recumbent for 3 days while in a conference in [place]. We wrapped her leg last week these are all healed. We did not previously have her in compression on the right leg. 05/29/2020 upon evaluation today patient appears to be doing well with regard to the wound on the plantar aspect of her foot medially. This is measuring smaller and looking much better than last time I saw her. Again when I did see her last was 2 weeks back and the wound was significantly larger. I do believe the cast is helping and I believe the collagen is a good option for her. 06/05/2020 on evaluation today patient appears to be doing well with regard to her foot ulcer this is actually measuring significantly better and overall I feel like she is doing excellent. There is no signs of active infection at this time. 06/12/2020 upon evaluation today patient actually continues to show signs of good improvement which is excellent news. I am extremely pleased with how she seems to be progressing at this point in regard to her wound. There is still some depth to the wound but I do believe the collagen is helping her quite a  bit. 06/19/2020 upon evaluation today patient appears to be doing well with regard to her plantar foot ulcer. She is actually making excellent progress and in fact this appears to be almost completely healed. With that being said I do believe that the patient is going to actually require 1 more week in the cast although after that I am hopeful she will be ready for discharge. 06/26/2020 on evaluation today patient appears to be doing well in regard to her wound currently. Fortunately there is no signs of active infection in general I  feel like she is doing excellent. This appears to be completely healed I think she is ready to come out of the cast. 11/29; patient comes back in the clinic today with a very quick reopening in the exact same area on the medial plantar foot. She had been healed out last time. She went back into some new balance shoes that she got at [employer] with a custom insert. As it turns out this wound also happened when wearing these shoes although there was some modification made I think with the wound initially happened they added some foam around the wound area. This obviously is not going to be sufficient. 07/17/2020 on evaluation today patient appears to be doing well with regard to her wound. Fortunately she seems to be making good progress. Unfortunately she was in the hospital due to a issue with colitis and had just been discharged today in fact. She tells me that her biggest concern here is that a lot of her numbers especially her creatinine were somewhat elevated and problematic. She is can be following up with her provider in order to have a further work-up at this time. With that being said she did need to come back in for a cast change. She did not allow them to remove the cast due to the fact that she did not feel like that was the issue whatsoever and indeed it does not appear that was the case her wound appears to be doing excellent today. 07/24/2020 upon evaluation  today patient appears to be doing well in regard to her foot ulcer. She still has a small opening but this is showing signs of excellent improvement overall but they were very close to complete resolution. No fevers, chills, nausea, vomiting, or diarrhea. 07/31/2020 upon evaluation today patient actually appears to be doing excellent she is actually completely healed this is great news. Fortunately there is no signs of active infection at this time. No fevers, chills, nausea, vomiting, or diarrhea. The patient tells me that she did see Dr. Paulla Dolly in order to get to [employer] so that she can have a custom shoe made. She saw him earlier last week. She does have an appointment with Liliane Channel on the sixth I believe on January she tells me Readmission: 08/28/2020 on evaluation today patient appears to be doing poorly in regard to her wound currently. She tells me this has reopened. The good news that she did get measured for and actually her shoes are on the way in from [facility].. With that being said she has been trying to stay off of this is much as possible using her wheelchair around home. Nonetheless has been somewhat difficult. The good news is her shoes should be here next week 09/04/2020 upon evaluation today patient appears to be doing well with regard to her wound. There is a little bit of callus buildup but nothing too significant she does tell me she was very active this week. Fortunately there is no signs of systemic infection at this point. The dorsal foot wound actually appears to be doing better. I think this is healed. 09/11/2020 upon evaluation today patient appears to actually be doing quite well in my opinion based on what I am seeing today. Fortunately there is no signs of infection in fact her mother is certain that this is even open any longer based on what I see. Fortunately I think the patient has been doing everything she can to try to keep this under control. 09/18/2020 upon evaluation  today patient appears to be doing about the same in regard to her foot ulcer. She has been tolerating the dressing changes without complication. Fortunately there is no sign of active infection at this time. No fevers, chills, nausea, vomiting, or diarrhea. She is going require some sharp debridement today. 09/25/2020 upon evaluation today patient appears to be doing well with regard to his wounds she has been tolerating the dressing changes without complication. Her wound appears to be completely healed based on what I am seeing at this point. There does not appear to be any signs of active infection at this time which is great news. No fevers, chills, nausea, vomiting, or diarrhea. She did get the cushion for her foot as well that she ordered to try to help keep pressure off of this area. That looks like it may be very beneficial for her to be honest. 10/02/2020 upon evaluation today patient appears to be doing more poorly in regard to her foot ulcer. Unfortunately this has reopened since we saw her last week. It apparently did not take too long at all for this to happen. She is obviously somewhat disappointed as she was hopeful that that would be time this thing would stay closed. Fortunately there is no evidence of active infection at this point. No fevers, chills, nausea, vomiting, or diarrhea. With that being said I am good have to perform a little bit of sharp debridement clear away some of the debris currently. Including a minimal amount of callus at this time. 10/16/2020 upon evaluation today patient appears to be doing about the same in regard to her foot ulcer unfortunately. There does not appear to be any signs of active infection which is great news. No fever chills noted she has been tolerating the dressing changes without complication which is great news. Unfortunately she tells me that she has been very depressed about the situation is getting very frustrating to her that she continues to  have issues despite everything that she is trying to do to stay off of her foot. She is extremely discouraged and I do hate to hear this. Obviously we have been hopeful that if she got her shoes that would make a difference she is actually can be getting those tomorrow but I am not certain that that alone is good to be enough to get this to heal. We may end up having to consider going back into a total contact cast to get this to heal and then subsequently once we get her healed get her into her new diabetic shoes which will and my hope anyway keep this from reopening again. 10/23/2020 upon evaluation today patient appears to be doing a little worse both in regard to the size of her wound as well as in regard to the fact that she has erythema surrounding the wound and wrap around the lateral part of her foot where she tells me has been somewhat sore. Fortunately there does not appear to be any evidence of infection systemically but locally definitely there is erythema and warmth consistent with local cellulitis. I am glad to remove some of the callus as well. 10/30/2020 upon evaluation today patient appears to be doing well with regard to her wound on the plantar aspect of her foot. This is significantly improved compared to last week. 10/30/2020 upon evaluation today patient actually is making excellent progress her wound appears to show signs of great improvement which is wonderful and that she is extremely pleased to hear this. She actually  leaves Sunday to go on her trip with her half sister and friends. 11/13/2020 on evaluation today patient's wound actually appears to be doing quite well. There does not appear to be any signs of infection it has been 2 weeks since have seen her she does have a little bit of callus buildup here today but at the same time I do not believe the vacation time set her back any just has not really made a lot of significant improvement. She is done with her antibiotics at  this point. 11/20/2020 upon evaluation today patient appears to be doing poorly in regard to her foot. In fact this appears to be showing signs of Infection. She has erythema and warmth that is concerning. I know she is very discouraged as this seems to be a recurrent issue. I think we may need to delve further into the possibility of something deeper going on here as far as a structural infection. 11/27/2020 patient presents for 1 week follow-up. She had a culture of her left foot ulcer that grew staph aureus sensitive to Augmentin. This was called in by [employer], [place] this morning. She is currently on doxycycline for previous culture result. Patient also had an x-ray done of her left foot that had conflicting results. We asked for clarification and was told we would have clarification on Monday. Patient states overall she is doing well. She tries to stay off of her foot is much as possible. 12/04/2020 patient presents for 1 week follow-up. She is currently taking Augmentin and tolerating this well. It was confirmed that her x-ray had no acute osseous abnormalities. We switched her to collagen from silver alginate last week. Patient is overall doing well and trying to stay off her foot is much as possible. She has no complaints today. 12/11/2020 upon evaluation today patient appears to be doing well with regard to her wound. She has been tolerating the dressing changes without complication. Fortunately there is no signs of active infection noted at this point. I think she is definitely ready to go back into the cast. 12/18/2020 upon evaluation today patient appears to be doing well with regard to her wound. She has been tolerating the dressing changes without complication. Fortunately there is no signs of active infection at this time. No fevers, chills, nausea, vomiting, or diarrhea. 12/25/2020 upon evaluation today patient appears to be doing well in general in regard to her plantar foot ulcer. With that being  said she tells me currently that there does not appear to be any signs of pain or problems which is great in fact the wound appears to be almost completely closed which is also excellent news. I do not see any signs of infection which is great news as well. No fevers, chills, nausea, vomiting, or diarrhea. Electronic Signature(s) Signed: 12/25/2020 1:20:33 PM By: Worthy Keeler PA-C Entered By: Worthy Keeler on 12/25/2020 13:20:33 -------------------------------------------------------------------------------- Physical Exam Details Patient Name: Date of Service: [name] [name], [name] RA E. 12/25/2020 8:15 A M Medical Record Number: [id number] Patient Account Number: [id number] Date of Birth/Sex: Treating RN: July 23, 1946 (75 y.o. [name] Other Clinician: Referring Provider: Treating Provider/Extender: Adele Schilder in Treatment: 45 Constitutional Well-nourished and well-hydrated in no acute distress. Respiratory normal breathing without difficulty. Psychiatric this patient is able to make decisions and demonstrates good insight into disease process. Alert and Oriented x 3. pleasant and cooperative. Notes Upon inspection patient's wound bed did require little bit of  debridement just to clear away some of the callus around the edges of the wound. There really was no significant sharp debridement centrally where the small opening is pretty much was just able to clearly think way and expose the small opening. Nonetheless I think that this is very close to complete epithelization and I do not believe that there is any need for switch of the treatment plan I think that the collagen is doing well I think the cast is doing excellent. Electronic Signature(s) Signed: 12/25/2020 1:21:04 PM By: Worthy Keeler PA-C Entered By: Worthy Keeler on 12/25/2020  13:21:04 -------------------------------------------------------------------------------- Physician Orders Details Patient Name: Date of Service: [name] [name], [name] RA E. 12/25/2020 8:15 A M Medical Record Number: [id number] Patient Account Number: [id number] Date of Birth/Sex: Treating RN: 1946-01-31 (75 y.o. [name] Other Clinician: Referring Provider: Treating Provider/Extender: Adele Schilder in Treatment: 76 Verbal / Phone Orders: No Diagnosis Coding ICD-10 Coding Code Description E11.621 Type 2 diabetes mellitus with foot ulcer L97.522 Non-pressure chronic ulcer of other part of left foot with fat layer exposed M14.672 Charcot's joint, left ankle and foot E11.40 Type 2 diabetes mellitus with diabetic neuropathy, unspecified Follow-up Appointments ppointment in 1 week. - with Margarita Grizzle - extra time for cast Return A Bathing/ Shower/ Hygiene May shower with protection but do not get wound dressing(s) wet. Off-Loading Total Contact Cast to Left Lower Extremity Additional Orders / Instructions Follow Nutritious Diet Wound Treatment Wound #1RRR - Foot Wound Laterality: Plantar, Left, Medial Prim Dressing: Promogran Prisma Matrix, 4.34 (sq in) (silver collagen) Every Other Day/30 Days ary Discharge Instructions: Moisten collagen with saline or hydrogel Secondary Dressing: Woven Gauze Sponge, Non-Sterile 4x4 in (Generic) Every Other Day/30 Days Discharge Instructions: Apply over primary dressing as directed. Secondary Dressing: Optifoam Non-Adhesive Dressing, 4x4 in Every Other Day/30 Days Discharge Instructions: Apply over primary dressing cut to make foam donut Secured With: 64M Pinon Surgical Tape, 2x2 (in/yd) (Generic) Every Other Day/30 Days Discharge Instructions: Secure dressing with tape as directed. Electronic Signature(s) Signed: 12/25/2020 1:22:22 PM By: Worthy Keeler PA-C Signed: 12/25/2020  6:10:17 PM By: [name] RN, BSN Entered By: [name] on 12/25/2020 08:32:08 -------------------------------------------------------------------------------- Problem List Details Patient Name: Date of Service: [name] Guarneri RBA RA E. 12/25/2020 8:15 A M Medical Record Number: [id number] Patient Account Number: [id number] Date of Birth/Sex: Treating RN: 09/25/1945 (75 y.o. [name] Other Clinician: Referring Provider: Treating Provider/Extender: Adele Schilder in Treatment: 56 Active Problems ICD-10 Encounter Code Description Active Date MDM Diagnosis E11.621 Type 2 diabetes mellitus with foot ulcer 01/17/2020 No Yes L97.522 Non-pressure chronic ulcer of other part of left foot with fat layer exposed 01/17/2020 No Yes M14.672 Charcot's joint, left ankle and foot 01/17/2020 No Yes E11.40 Type 2 diabetes mellitus with diabetic neuropathy, unspecified 01/17/2020 No Yes Inactive Problems ICD-10 Code Description Active Date Inactive Date I10 Essential (primary) hypertension 01/17/2020 01/17/2020 Resolved Problems Electronic Signature(s) Signed: 12/25/2020 8:25:38 AM By: Worthy Keeler PA-C Entered By: Worthy Keeler on 12/25/2020 08:25:38 -------------------------------------------------------------------------------- Progress Note Details Patient Name: Date of Service: [name] [name], [name] RA E. 12/25/2020 8:15 A M Medical Record Number: [id number] Patient Account Number: [id number] Date of Birth/Sex: Treating RN: [birth year] (75 y.o. [name] Other Clinician: Referring Provider: Treating Provider/Extender: Adele Schilder in Treatment: 3 Subjective Chief Complaint Information obtained from  Patient Left foot ulcer History of Present Illness (HPI) 01/17/2020 upon evaluation today patient presents for initial evaluation here in our clinic concerning issues she  has been having with a left medial/plantar foot ulcer. This is actually been an issue for her since October 2020. She has been seeing Dr. Doran Durand for quite some time during that course. Fortunately there is no signs of active infection at this time. Or least no mention of this to have seen in general. With that being said unfortunately I do see some signs of erythema noted today that does have me concerned about the possibility of infection at this point in the surrounding area of the wound. There is also a warm to touch at the site which is somewhat concerning. Fortunately there is no evidence of systemic infection which is great news. The patient does have a history of diabetes mellitus type 2, Charcot foot which is what led to the wound, and hypertension. She notes that she was in a cast for some time with Dr. Doran Durand for about 8 weeks. During that time they were utilizing according to the patient silver nitrate along with a foam doughnut and then Coban to secure in place in the cast in place. With that being said I do not have the actual records to review we are going to try to get a hold of those unfortunately they would not flow over into care everywhere I did look today. She has been seeing Dr. Doran Durand and his physician assistant Larkin Ina up until the end of May and apparently is still seeing them on a regular basis every 2 weeks roughly. She has also tried Iodosorb without effect here. 01/24/2020 upon evaluation today patient actually appears to be doing quite well with regard to her wounds. She has been tolerating the dressing changes without complication. Fortunately there is no signs of active infection spreading which is good news. Her culture did show signs of Staph aureus I did place her on Augmentin due to the erythema surrounding the wound. With that being said the wound does appear to be doing better she has her longer walking cast/boot and I think that is actually good for her for the time  being. I am considering reinitiating total contact cast when she gets back from vacation but next week she will actually be out of town at [employer] she knows not to get in the water but she still obviously is planning to enjoy herself she is going to take it easy on her foot however. 02/07/2020 upon evaluation today patient appears to be doing fairly well in regard to her ulcer on her foot. Fortunately there is no signs of severe infection at this time which is great news and overall very pleased in that regard. With that being said I do think that she could still benefit from a total contact cast. Nonetheless she is using her walking boot which at least provide some protection and that it prevents some of the friction occurring when she is ambulating. 02/14/2020 upon evaluation today patient appears to be doing well with regard to her foot ulcer. This is actually measuring a little bit smaller yet again this week. Overall very pleased with where things stand and I do not see any signs of active infection at this time which is also good news. Since she is measuring better the patient has wanting to somewhat hold off on proceeding with the total contact cast which I think is reasonable at this point. 02/28/2020 on evaluation  today patient appears to be doing well in general in regard to her wound although she has a lot of callus buildup as compared to last time I saw her. This is can require sharp debridement today. I do believe she really needs the total contact cast as well which we have discussed previous. 7/23; patient comes in for a total contact cast change 03/06/2020 on evaluation today patient appears to be doing quite well with regard to her wounds. Fortunately the wound bed is measuring smaller and looking much better there is little callus noted although there is some debridement necessary today. 03/13/2020 on evaluation today patient's wound actually appears to be doing excellent which is great  news. With that being said unfortunately she is having some issues currently with her left leg where she does have cellulitis it appears. This may have come from an area that rubbed underneath the cast from last week that we noted we padded that area and it looks to be doing excellent at this point but nonetheless the leg was somewhat painful, swollen, and somewhat erythematous. She also had an elevated white blood cell count of 11.5 based on what I saw on looking at her records from the med center in [facility] from where she was seen yesterday. Unfortunately with [place] having a provider on vacation there was no one here in the clinic in the afternoon when she called therefore she went to the ER as advised. Subsequently they did not cut off the cast as they did not have anyone from orthopedics there to do so and subsequently also did not have the ability to do the Doppler for evaluation of DVT They recommended therefore given her dose of Eliquis as well as . Augmentin and sent her home to come see [place] today to have the cast taken off and then she is supposed to go back to have the study for DVT performed they are following. 03/20/2020 upon evaluation today patient appears to be doing well with regard to her foot all things considered we have not been able to use the total contact cast due to the infection that she had last week. She has been on the doxycycline and she had a 10-day supply of that I do believe that is helping and her leg appears to be doing better. With that being said there is fortunately no signs of active infection systemically at this time which is good news. No fevers, chills, nausea, vomiting, or diarrhea. 03/27/2020 upon evaluation today patient appears to be doing well with regard to her foot ulcer. There does not appear to be signs of active infection which is great news. Overall I am very pleased with where things stand at this point. 04/03/2020 upon evaluation today patient  appears to be doing pretty well in regard to the overall appearance of her wound. Fortunately there is no signs of active infection at this time which is great news. No fevers, chills, nausea, vomiting, or diarrhea. With that being said she does have some blue-green drainage that actually is a little bit concerning to me for the possibility of Pseudomonas. I discussed that with the patient today. With that being said I do believe that we may be able to manage this however with the topical antibiotic cream as opposed to having to do anything oral especially since she seems to be doing so well with overall appearance of the wound. 04/10/2020 on evaluation today patient appears to be doing about the same roughly in regard to  the overall size of her wound. With that being said she fortunately has not shown any signs of worsening overall which is good news. I do believe that she is doing a great job trying to offload but again she may still do better with the cast. I do not see in the blue-green drainage that we noticed previously I do believe the gentamicin help in this regard. 04/17/2020 on evaluation today patient's wound appears to be doing about the same at this point. There is no significant improvement at this point. No fever chills noted. She is up for put the cast back on the day. That she states in a couple weeks she will need to have this off to go to a workshop. 04/24/2020 on evaluation today patient appears to be doing significantly better in regard to her wound. Fortunately there is no signs of active infection and overall feel like she is making great progress the cast seems to have done excellent for her. 05/01/2020 upon evaluation today patient presents for reevaluation she really does not appear to be doing too badly in regard to the actual wound on the left foot we have been managing. Unfortunately she has bilateral lower extremity edema with blisters between the webspace of her first and second  toe on both feet. She has a tremendous amount of edema in the legs which I think is where this is coming from it does not appear to be infected but nonetheless I do believe this is can be something that needs to be addressed today. Obviously this means we probably will not be putting the cast on at this point. She attributes this to the fact that she was sitting with her feet on the floor much longer during a conference last week she had a great time but unfortunately had a lot of complications as a result. 05/08/2020 upon evaluation today patient appears to be doing somewhat better in regard to her wounds at this time. Fortunately there is no signs of active infection which is great news. With that being said I do believe that the blisters have ruptured and unfortunately did not just reattach I will remove some of the blistered tissue today. With that being said I do think the wound itself on the plantar aspect of left foot does need to have sharp debridement. 05/15/2020 upon evaluation today patient appears to be doing about the same in regard to her foot ulcer. Unfortunately in the past week her husband had a fall where he sustained a mild traumatic brain bleed. Fortunately he is doing better but being that he was in the hospital she had a walk on this a lot more. The wound does not appear to be any better is also not really appearing to be significantly worse which is good news. There is no signs of active infection at this time. 10/14; patient with a small diabetic wound on the medial part of her left foot. We have been using silver collagen a total contact cast making good progress. I think the patient had a series of blisters on her dorsal foot probably secondary to having her legs recumbent for 3 days while in a conference in [place]. We wrapped her leg last week these are all healed. We did not previously have her in compression on the right leg. 05/29/2020 upon evaluation today patient appears  to be doing well with regard to the wound on the plantar aspect of her foot medially. This is measuring smaller and looking much better than  last time I saw her. Again when I did see her last was 2 weeks back and the wound was significantly larger. I do believe the cast is helping and I believe the collagen is a good option for her. 06/05/2020 on evaluation today patient appears to be doing well with regard to her foot ulcer this is actually measuring significantly better and overall I feel like she is doing excellent. There is no signs of active infection at this time. 06/12/2020 upon evaluation today patient actually continues to show signs of good improvement which is excellent news. I am extremely pleased with how she seems to be progressing at this point in regard to her wound. There is still some depth to the wound but I do believe the collagen is helping her quite a bit. 06/19/2020 upon evaluation today patient appears to be doing well with regard to her plantar foot ulcer. She is actually making excellent progress and in fact this appears to be almost completely healed. With that being said I do believe that the patient is going to actually require 1 more week in the cast although after that I am hopeful she will be ready for discharge. 06/26/2020 on evaluation today patient appears to be doing well in regard to her wound currently. Fortunately there is no signs of active infection in general I feel like she is doing excellent. This appears to be completely healed I think she is ready to come out of the cast. 11/29; patient comes back in the clinic today with a very quick reopening in the exact same area on the medial plantar foot. She had been healed out last time. She went back into some new balance shoes that she got at [employer] with a custom insert. As it turns out this wound also happened when wearing these shoes although there was some modification made I think with the wound initially  happened they added some foam around the wound area. This obviously is not going to be sufficient. 07/17/2020 on evaluation today patient appears to be doing well with regard to her wound. Fortunately she seems to be making good progress. Unfortunately she was in the hospital due to a issue with colitis and had just been discharged today in fact. She tells me that her biggest concern here is that a lot of her numbers especially her creatinine were somewhat elevated and problematic. She is can be following up with her provider in order to have a further work-up at this time. With that being said she did need to come back in for a cast change. She did not allow them to remove the cast due to the fact that she did not feel like that was the issue whatsoever and indeed it does not appear that was the case her wound appears to be doing excellent today. 07/24/2020 upon evaluation today patient appears to be doing well in regard to her foot ulcer. She still has a small opening but this is showing signs of excellent improvement overall but they were very close to complete resolution. No fevers, chills, nausea, vomiting, or diarrhea. 07/31/2020 upon evaluation today patient actually appears to be doing excellent she is actually completely healed this is great news. Fortunately there is no signs of active infection at this time. No fevers, chills, nausea, vomiting, or diarrhea. The patient tells me that she did see Dr. Paulla Dolly in order to get to [place] so that she can have a custom shoe made. She saw him earlier last week. She  does have an appointment with Liliane Channel on the sixth I believe on January she tells me Readmission: 08/28/2020 on evaluation today patient appears to be doing poorly in regard to her wound currently. She tells me this has reopened. The good news that she did get measured for and actually her shoes are on the way in from [facility].. With that being said she has been trying to stay off of this  is much as possible using her wheelchair around home. Nonetheless has been somewhat difficult. The good news is her shoes should be here next week 09/04/2020 upon evaluation today patient appears to be doing well with regard to her wound. There is a little bit of callus buildup but nothing too significant she does tell me she was very active this week. Fortunately there is no signs of systemic infection at this point. The dorsal foot wound actually appears to be doing better. I think this is healed. 09/11/2020 upon evaluation today patient appears to actually be doing quite well in my opinion based on what I am seeing today. Fortunately there is no signs of infection in fact her mother is certain that this is even open any longer based on what I see. Fortunately I think the patient has been doing everything she can to try to keep this under control. 09/18/2020 upon evaluation today patient appears to be doing about the same in regard to her foot ulcer. She has been tolerating the dressing changes without complication. Fortunately there is no sign of active infection at this time. No fevers, chills, nausea, vomiting, or diarrhea. She is going require some sharp debridement today. 09/25/2020 upon evaluation today patient appears to be doing well with regard to his wounds she has been tolerating the dressing changes without complication. Her wound appears to be completely healed based on what I am seeing at this point. There does not appear to be any signs of active infection at this time which is great news. No fevers, chills, nausea, vomiting, or diarrhea. She did get the cushion for her foot as well that she ordered to try to help keep pressure off of this area. That looks like it may be very beneficial for her to be honest. 10/02/2020 upon evaluation today patient appears to be doing more poorly in regard to her foot ulcer. Unfortunately this has reopened since we saw her last week. It apparently did not  take too long at all for this to happen. She is obviously somewhat disappointed as she was hopeful that that would be time this thing would stay closed. Fortunately there is no evidence of active infection at this point. No fevers, chills, nausea, vomiting, or diarrhea. With that being said I am good have to perform a little bit of sharp debridement clear away some of the debris currently. Including a minimal amount of callus at this time. 10/16/2020 upon evaluation today patient appears to be doing about the same in regard to her foot ulcer unfortunately. There does not appear to be any signs of active infection which is great news. No fever chills noted she has been tolerating the dressing changes without complication which is great news. Unfortunately she tells me that she has been very depressed about the situation is getting very frustrating to her that she continues to have issues despite everything that she is trying to do to stay off of her foot. She is extremely discouraged and I do hate to hear this. Obviously we have been hopeful that if  she got her shoes that would make a difference she is actually can be getting those tomorrow but I am not certain that that alone is good to be enough to get this to heal. We may end up having to consider going back into a total contact cast to get this to heal and then subsequently once we get her healed get her into her new diabetic shoes which will and my hope anyway keep this from reopening again. 10/23/2020 upon evaluation today patient appears to be doing a little worse both in regard to the size of her wound as well as in regard to the fact that she has erythema surrounding the wound and wrap around the lateral part of her foot where she tells me has been somewhat sore. Fortunately there does not appear to be any evidence of infection systemically but locally definitely there is erythema and warmth consistent with local cellulitis. I am glad to remove some  of the callus as well. 10/30/2020 upon evaluation today patient appears to be doing well with regard to her wound on the plantar aspect of her foot. This is significantly improved compared to last week. 10/30/2020 upon evaluation today patient actually is making excellent progress her wound appears to show signs of great improvement which is wonderful and that she is extremely pleased to hear this. She actually leaves Sunday to go on her trip with her half sister and friends. 11/13/2020 on evaluation today patient's wound actually appears to be doing quite well. There does not appear to be any signs of infection it has been 2 weeks since have seen her she does have a little bit of callus buildup here today but at the same time I do not believe the vacation time set her back any just has not really made a lot of significant improvement. She is done with her antibiotics at this point. 11/20/2020 upon evaluation today patient appears to be doing poorly in regard to her foot. In fact this appears to be showing signs of Infection. She has erythema and warmth that is concerning. I know she is very discouraged as this seems to be a recurrent issue. I think we may need to delve further into the possibility of something deeper going on here as far as a structural infection. 11/27/2020 patient presents for 1 week follow-up. She had a culture of her left foot ulcer that grew staph aureus sensitive to Augmentin. This was called in by [place], [place] this morning. She is currently on doxycycline for previous culture result. Patient also had an x-ray done of her left foot that had conflicting results. We asked for clarification and was told we would have clarification on Monday. Patient states overall she is doing well. She tries to stay off of her foot is much as possible. 12/04/2020 patient presents for 1 week follow-up. She is currently taking Augmentin and tolerating this well. It was confirmed that her x-ray had no  acute osseous abnormalities. We switched her to collagen from silver alginate last week. Patient is overall doing well and trying to stay off her foot is much as possible. She has no complaints today. 12/11/2020 upon evaluation today patient appears to be doing well with regard to her wound. She has been tolerating the dressing changes without complication. Fortunately there is no signs of active infection noted at this point. I think she is definitely ready to go back into the cast. 12/18/2020 upon evaluation today patient appears to be doing well with regard to  her wound. She has been tolerating the dressing changes without complication. Fortunately there is no signs of active infection at this time. No fevers, chills, nausea, vomiting, or diarrhea. 12/25/2020 upon evaluation today patient appears to be doing well in general in regard to her plantar foot ulcer. With that being said she tells me currently that there does not appear to be any signs of pain or problems which is great in fact the wound appears to be almost completely closed which is also excellent news. I do not see any signs of infection which is great news as well. No fevers, chills, nausea, vomiting, or diarrhea. Objective Constitutional Well-nourished and well-hydrated in no acute distress. Vitals Time Taken: 8:04 AM, Height: 66 in, Weight: 245 lbs, BMI: 39.5, Temperature: 98.4 F, Pulse: 74 bpm, Respiratory Rate: 17 breaths/min, Blood Pressure: 124/74 mmHg. Respiratory normal breathing without difficulty. Psychiatric this patient is able to make decisions and demonstrates good insight into disease process. Alert and Oriented x 3. pleasant and cooperative. General Notes: Upon inspection patient's wound bed did require little bit of debridement just to clear away some of the callus around the edges of the wound. There really was no significant sharp debridement centrally where the small opening is pretty much was just able to  clearly think way and expose the small opening. Nonetheless I think that this is very close to complete epithelization and I do not believe that there is any need for switch of the treatment plan I think that the collagen is doing well I think the cast is doing excellent. Integumentary (Hair, Skin) Wound #1RRR status is Open. Original cause of wound was Gradually Appeared. The date acquired was: 05/11/2019. The wound has been in treatment 49 weeks. The wound is located on the [place]. The wound measures 0cm length x 0cm width x 0cm depth; 0cm^2 area and 0cm^3 volume. Assessment Active Problems ICD-10 Type 2 diabetes mellitus with foot ulcer Non-pressure chronic ulcer of other part of left foot with fat layer exposed Charcot's joint, left ankle and foot Type 2 diabetes mellitus with diabetic neuropathy, unspecified Procedures Wound #1RRR Pre-procedure diagnosis of Wound #1RRR is a Diabetic Wound/Ulcer of the Lower Extremity located on the Left,Medial,Plantar Foot .Severity of Tissue Pre Debridement is: Fat layer exposed. There was a Selective/Open Wound Skin/Epidermis Debridement with a total area of 6.25 sq cm performed by Worthy Keeler, PA. With the following instrument(s): Curette to remove Non-Viable tissue/material. Material removed includes Callus and Skin: Epidermis and. No specimens were taken. A time out was conducted at 08:25, prior to the start of the procedure. There was no bleeding. The procedure was tolerated well with a pain level of 0 throughout and a pain level of 0 following the procedure. Post Debridement Measurements: 0.1cm length x 0.1cm width x 0.1cm depth; 0.001cm^3 volume. Character of Wound/Ulcer Post Debridement is improved. Severity of Tissue Post Debridement is: Fat layer exposed. Post procedure Diagnosis Wound #1RRR: Same as Pre-Procedure Pre-procedure diagnosis of Wound #1RRR is a Diabetic Wound/Ulcer of the Lower Extremity located on the  Left,Medial,Plantar Foot . There was a Total Contact Cast Procedure by Worthy Keeler, PA. Post procedure Diagnosis Wound #1RRR: Same as Pre-Procedure Plan Follow-up Appointments: Return Appointment in 1 week. - with Margarita Grizzle - extra time for cast Bathing/ Shower/ Hygiene: May shower with protection but do not get wound dressing(s) wet. Off-Loading: T Contact Cast to Left Lower Extremity otal Additional Orders / Instructions: Follow Nutritious Diet WOUND #1RRR: - Foot Wound Laterality: Plantar,  Left, Medial Prim Dressing: Promogran Prisma Matrix, 4.34 (sq in) (silver collagen) Every Other Day/30 Days ary Discharge Instructions: Moisten collagen with saline or hydrogel Secondary Dressing: Woven Gauze Sponge, Non-Sterile 4x4 in (Generic) Every Other Day/30 Days Discharge Instructions: Apply over primary dressing as directed. Secondary Dressing: Optifoam Non-Adhesive Dressing, 4x4 in Every Other Day/30 Days Discharge Instructions: Apply over primary dressing cut to make foam donut Secured With: 55M [place] [facility] T ape, 2x2 (in/yd) (Generic) Every Other Day/30 Days Discharge Instructions: Secure dressing with tape as directed. 1. Would recommend currently that we going to continue with the wound care measures as before and the patient is in agreement the plan this includes the use of the total contact cast. I believe that she is actually doing quite well with this. 2. I am also can recommend we continue with the collagen which I feel like is helpful. 3. I would also suggest the patient continue to monitor for anything that appears to be worsening such as increased pain if anything like this occurs she should let me know soon as possible. Hopefully that will not be the case. 4. It is possible that we may need to keep her in the cast for 1 week longer to allow things to toughen up a little bit it just depends on how things appear next week. She will be seeing Dr. [facility] I will  leave it to Dr. Jodene Nam judgment in that regard. We will see patient back for reevaluation in 1 week here in the clinic. If anything worsens or changes patient will contact our office for additional recommendations. Electronic Signature(s) Signed: 12/25/2020 1:21:56 PM By: Worthy Keeler PA-C Entered By: Worthy Keeler on 12/25/2020 13:21:56 -------------------------------------------------------------------------------- Total Contact Cast Details Patient Name: Date of Service: [name] [name], [name] E. 12/25/2020 8:15 A M Medical Record Number: [id number] Patient Account Number: [id number] Date of Birth/Sex: Treating RN: 02/14/1946 (75 y.o. [name] Other Clinician: Referring Provider: Treating Provider/Extender: Adele Schilder in Treatment: 54 T Contact Cast Applied for Wound Assessment: otal Wound #1RRR Left,Medial,Plantar Foot Performed By: Physician Worthy Keeler, PA Post Procedure Diagnosis Same as Pre-procedure Electronic Signature(s) Signed: 12/25/2020 1:22:22 PM By: Worthy Keeler PA-C Signed: 12/25/2020 6:10:17 PM By: [name] RN, BSN Entered By: [name] on 12/25/2020 08:32:33 -------------------------------------------------------------------------------- SuperBill Details Patient Name: Date of Service: [name] Guarneri RBA RA E. 12/25/2020 Medical Record Number: [id number] Patient Account Number: [id number] Date of Birth/Sex: Treating RN: 05/15/1946 (75 y.o. [name], [name] Primary Care Provider: [name] Other Clinician: Referring Provider: Treating Provider/Extender: Adele Schilder in Treatment: 35 Diagnosis Coding ICD-10 Codes Code Description E11.621 Type 2 diabetes mellitus with foot ulcer L97.522 Non-pressure chronic ulcer of other part of left foot with fat layer exposed M14.672 Charcot's joint, left ankle and foot E11.40 Type 2 diabetes mellitus with diabetic  neuropathy, unspecified Facility Procedures CPT4 Code: [id number] Description: [phone] - DEBRIDE WOUND 1ST 20 SQ CM OR < ICD-10 Diagnosis Description L97.522 Non-pressure chronic ulcer of other part of left foot with fat layer exposed Modifier: Quantity: 1 Physician Procedures : CPT4 Code Description Modifier [id number] - WC PHYS DEBR WO ANESTH 20 SQ CM ICD-10 Diagnosis Description L97.522 Non-pressure chronic ulcer of other part of left foot with fat layer exposed Quantity: 1 Electronic Signature(s) Signed: 12/25/2020 1:22:05 PM By: Worthy Keeler PA-C Entered By: Worthy Keeler on 12/25/2020 13:22:04

## 2020-12-25 NOTE — Telephone Encounter (Signed)
received voicemail from [employer] the Pa at the wound care center about pts shoes. He stated pts wound is almost healed and should be this week but the shoes pt received may not work for pt. Pt told him that they were slipping on her heel when she tried them on and we added a small insert thing. She has not worn them since the wound was not healed .   I returned call and spoke to [employer] the pa and explained that I had left message for pt that Liliane Channel was no longer here but to call to schedule an appt for that Friday(this was the week of 3.21) that Ej had an opening @ 2pm. Pt never returned my call. I explained that  E[facility] is only here 2 times a month and scheduled pt with Ej for 6.10 and Mr stone is going to let pt know. I also explained that sometimes when the shoes slip on the heels we do add a spacer to tighten it up so that is normal.

## 2020-12-26 ENCOUNTER — Other Ambulatory Visit: Payer: Self-pay | Admitting: Internal Medicine

## 2020-12-26 ENCOUNTER — Telehealth: Payer: Self-pay

## 2020-12-26 NOTE — Progress Notes (Signed)
JENNAE, HAKEEM ([id number]) Visit Report for 12/25/2020 Arrival Information Details Patient Name: Date of Service: ANUSHRI, CASALINO RA E. 12/25/2020 8:15 A M Medical Record Number: [id number] Patient Account Number: [id number] Date of Birth/Sex: Treating RN: Jul 03, 1946 (75 y.o. [name], Lauren Primary Care Adriann Thau: [name] Other Clinician: Referring Taleeya Blondin: Treating Kelie Gainey/Extender: Adele Schilder in Treatment: 80 Visit Information History Since Last Visit Added or deleted any medications: No Patient Arrived: Wheel Chair Any new allergies or adverse reactions: No Arrival Time: 08:01 Had a fall or experienced change in No Accompanied By: self activities of daily living that may affect Transfer Assistance: None risk of falls: Patient Identification Verified: Yes Signs or symptoms of abuse/neglect since last visito No Secondary Verification Process Completed: Yes Hospitalized since last visit: No Patient Requires Transmission-Based Precautions: No Implantable device outside of the clinic excluding No Patient Has Alerts: No cellular tissue based products placed in the center since last visit: Has Dressing in Place as Prescribed: Yes Has Footwear/Offloading in Place as Prescribed: Yes Left: T Contact Cast otal Pain Present Now: No Electronic Signature(s) Signed: 12/25/2020 6:38:21 PM By: Rhae Hammock RN Entered By: Rhae Hammock on 12/25/2020 08:02:40 -------------------------------------------------------------------------------- Encounter Discharge Information Details Patient Name: Date of Service: [name] 12/25/2020 8:15 A M Medical Record Number: [id number] Patient Account Number: [id number] Date of Birth/Sex: Treating RN: May 26, 1946 (75 y.o. [name], Lauren Primary Care Lyndzie Zentz: [name] Other Clinician: Referring Tamir Wallman: Treating Niambi Smoak/Extender: Adele Schilder in Treatment: 48 Encounter  Discharge Information Items Post Procedure Vitals Discharge Condition: Stable Temperature (F): 97.8 Ambulatory Status: Wheelchair Pulse (bpm): 74 Discharge Destination: Home Respiratory Rate (breaths/min): 17 Transportation: Private Auto Blood Pressure (mmHg): 124/74 Accompanied By: husband Schedule Follow-up Appointment: Yes Clinical Summary of Care: Patient Declined Electronic Signature(s) Signed: 12/25/2020 6:38:21 PM By: Rhae Hammock RN Entered By: Rhae Hammock on 12/25/2020 10:27:09 -------------------------------------------------------------------------------- Lower Extremity Assessment Details Patient Name: Date of Service: [name] 12/25/2020 8:15 A M Medical Record Number: [id number] Patient Account Number: [id number] Date of Birth/Sex: Treating RN: 07-27-1946 (75 y.o. [name], Lauren Primary Care Nadalyn Deringer: [name] Other Clinician: Referring Daysen Gundrum: Treating Aariya Ferrick/Extender: Landis Martins Weeks in Treatment: 49 Edema Assessment Assessed: [Left: Yes] [Right: No] Edema: [Left: Ye] [Right: s] Calf Left: Right: Point of Measurement: 29 cm From Medial Instep 38 cm Ankle Left: Right: Point of Measurement: 12 cm From Medial Instep 23 cm Vascular Assessment Pulses: Dorsalis Pedis Palpable: [Left:Yes] Doppler Audible: [Left:Yes] Posterior Tibial Palpable: [Left:Yes Yes] Electronic Signature(s) Signed: 12/25/2020 6:38:21 PM By: Rhae Hammock RN Entered By: Rhae Hammock on 12/25/2020 08:05:07 -------------------------------------------------------------------------------- Multi-Disciplinary Care Plan Details Patient Name: Date of Service: [name] 12/25/2020 8:15 A M Medical Record Number: [id number] Patient Account Number: [id number] Date of Birth/Sex: Treating RN: 17-Oct-1945 (76 y.o. [name] Primary Care Cassandr Cederberg: [name] Other Clinician: Referring Celedonio Sortino: Treating  Avonne Berkery/Extender: Adele Schilder in Treatment: [place] reviewed with physician Active Inactive Nutrition Nursing Diagnoses: Impaired glucose control: actual or potential Potential for alteratiion in Nutrition/Potential for imbalanced nutrition Goals: Patient/caregiver verbalizes understanding of need to maintain therapeutic glucose control per primary care physician Date Initiated: 01/17/2020 Date Inactivated: 02/14/2020 Target Resolution Date: 02/14/2020 Goal Status: Met Patient/caregiver will maintain therapeutic glucose control Date Initiated: 01/17/2020 Target Resolution Date: 01/15/2021 Goal Status: Active Interventions: Assess HgA1c results as ordered upon admission and as needed Assess patient nutrition upon admission and  as needed per policy Provide education on elevated blood sugars and impact on wound healing Treatment Activities: Patient referred to Primary Care Physician for further nutritional evaluation : 01/17/2020 Notes: Wound/Skin Impairment Nursing Diagnoses: Impaired tissue integrity Knowledge deficit related to ulceration/compromised skin integrity Goals: Patient/caregiver will verbalize understanding of skin care regimen Date Initiated: 01/17/2020 Target Resolution Date: 01/15/2021 Goal Status: Active Ulcer/skin breakdown will have a volume reduction of 30% by week 4 Date Initiated: 01/17/2020 Date Inactivated: 02/14/2020 Target Resolution Date: 02/14/2020 Goal Status: Met Ulcer/skin breakdown will have a volume reduction of 50% by week 8 Date Initiated: 02/14/2020 Date Inactivated: 03/13/2020 Target Resolution Date: 03/13/2020 Goal Status: Met Ulcer/skin breakdown will have a volume reduction of 80% by week 12 Date Initiated: 03/13/2020 Date Inactivated: 04/10/2020 Target Resolution Date: 04/10/2020 Goal Status: Unmet Unmet Reason: infection Interventions: Assess patient/caregiver ability to obtain necessary supplies Assess  patient/caregiver ability to perform ulcer/skin care regimen upon admission and as needed Assess ulceration(s) every visit Provide education on ulcer and skin care Treatment Activities: Skin care regimen initiated : 01/17/2020 Topical wound management initiated : 01/17/2020 Notes: Electronic Signature(s) Signed: 12/25/2020 6:10:17 PM By: [name] RN, BSN Entered By: [name] on 12/25/2020 08:02:35 -------------------------------------------------------------------------------- Pain Assessment Details Patient Name: Date of Service: [name] 12/25/2020 8:15 A M Medical Record Number: [id number] Patient Account Number: [id number] Date of Birth/Sex: Treating RN: 03/06/1946 (75 y.o. [name] Primary Care Eula Mazzola: [name] Other Clinician: Referring Shamina Etheridge: Treating Judythe Postema/Extender: Adele Schilder in Treatment: 48 Active Problems Location of Pain Severity and Description of Pain Patient Has Paino No Site Locations Rate the pain. Rate the pain. Current Pain Level: 0 Pain Management and Medication Current Pain Management: Electronic Signature(s) Signed: 12/25/2020 6:38:21 PM By: Rhae Hammock RN Entered By: Rhae Hammock on 12/25/2020 08:04:52 -------------------------------------------------------------------------------- Patient/Caregiver Education Details Patient Name: Date of Service: [name] 5/18/2022andnbsp8:15 A M Medical Record Number: [id number] Patient Account Number: [id number] Date of Birth/Gender: Treating RN: [birth year] (75 y.o. [name] Primary Care Physician: [name] Other Clinician: Referring Physician: Treating Physician/Extender: Adele Schilder in Treatment: 22 Education Assessment Education Provided To: Patient Education Topics Provided Elevated Blood Sugar/ Impact on Healing: Methods: Explain/Verbal Responses: Reinforcements needed, State  content correctly Offloading: Methods: Explain/Verbal Responses: Reinforcements needed, State content correctly Wound/Skin Impairment: Methods: Explain/Verbal Responses: Reinforcements needed, State content correctly Electronic Signature(s) Signed: 12/25/2020 6:10:17 PM By: [name] RN, BSN Entered By: [name] on 12/25/2020 08:03:30 -------------------------------------------------------------------------------- Wound Assessment Details Patient Name: Date of Service: [name] 12/25/2020 8:15 A M Medical Record Number: [id number] Patient Account Number: [id number] Date of Birth/Sex: Treating RN: Jun 24, 1946 (75 y.o. [name], Lauren Primary Care Maury Bamba: [name] Other Clinician: Referring Trevar Boehringer: Treating Regana Kemple/Extender: Landis Martins Weeks in Treatment: 49 Wound Status Wound Number: 1RRR Primary Diabetic Wound/Ulcer of the Lower Extremity Etiology: Wound Location: Left, Medial, Plantar Foot Wound Open Wounding Event: Gradually Appeared Status: Date Acquired: 05/11/2019 Comorbid Sleep Apnea, Deep Vein Thrombosis, Hypertension, Colitis, Type Weeks Of Treatment: 49 History: II Diabetes, Gout, Osteoarthritis, Neuropathy Clustered Wound: No Photos Wound Measurements Length: (cm) Width: (cm) Depth: (cm) Area: (cm) Volume: (cm) 0 % Reduction in Area: 100% 0 % Reduction in Volume: 100% 0 Epithelialization: Medium (34-66%) 0 0 Wound Description Classification: Grade 2 Wound Margin: Thickened Exudate Amount: Medium Exudate Type: Serosanguineous Exudate Color: red, brown Foul Odor After Cleansing: No Slough/Fibrino Yes Wound Bed Granulation Amount: Large (  67-100%) Exposed Structure Granulation Quality: Red, Pink Fascia Exposed: No Necrotic Amount: Small (1-33%) Fat Layer (Subcutaneous Tissue) Exposed: Yes Necrotic Quality: Adherent Slough Tendon Exposed: No Muscle Exposed: No Joint Exposed: No Bone Exposed:  No Treatment Notes Wound #1RRR (Foot) Wound Laterality: Plantar, Left, Medial Cleanser Peri-Wound Care Topical Primary Dressing Promogran Prisma Matrix, 4.34 (sq in) (silver collagen) Discharge Instruction: Moisten collagen with saline or hydrogel Secondary Dressing Woven Gauze Sponge, Non-Sterile 4x4 in Discharge Instruction: Apply over primary dressing as directed. Optifoam Non-Adhesive Dressing, 4x4 in Discharge Instruction: Apply over primary dressing cut to make foam donut Secured With [employer] [facility] T ape, 2x2 (in/yd) Discharge Instruction: Secure dressing with tape as directed. Compression Wrap Compression Stockings Add-Ons Electronic Signature(s) Signed: 12/25/2020 6:38:21 PM By: Rhae Hammock RN Signed: 12/26/2020 8:07:53 AM By: Sandre Kitty Entered By: Sandre Kitty on 12/25/2020 16:54:46 -------------------------------------------------------------------------------- Vitals Details Patient Name: Date of Service: Casamento, [name] 12/25/2020 8:15 A M Medical Record Number: [id number] Patient Account Number: [id number] Date of Birth/Sex: Treating RN: [birth year] (75 y.o. [name], Lauren Primary Care Stacia Feazell: [name] Other Clinician: Referring Tannen Vandezande: Treating Crystel Demarco/Extender: Adele Schilder in Treatment: 61 Vital Signs Time Taken: 08:04 Temperature (F): 98.4 Height (in): 66 Pulse (bpm): 74 Weight (lbs): 245 Respiratory Rate (breaths/min): 17 Body Mass Index (BMI): 39.5 Blood Pressure (mmHg): 124/74 Reference Range: 80 - 120 mg / dl Electronic Signature(s) Signed: 12/25/2020 6:38:21 PM By: Rhae Hammock RN Entered By: Rhae Hammock on 12/25/2020 08:04:41

## 2020-12-26 NOTE — Telephone Encounter (Signed)
PA Key: [id number]  Approved for LEVEMIR FLEXTOUCH 100 UNIT/ML FlexPen

## 2020-12-27 ENCOUNTER — Telehealth: Payer: Self-pay

## 2020-12-27 NOTE — Telephone Encounter (Signed)
Error

## 2020-12-27 NOTE — Addendum Note (Signed)
Addended by: Binnie Rail on: 12/27/2020 07:28 AM   Modules accepted: Orders

## 2020-12-31 DIAGNOSIS — L4059 Other psoriatic arthropathy: Secondary | ICD-10-CM | POA: Diagnosis not present

## 2021-01-01 ENCOUNTER — Other Ambulatory Visit: Payer: Self-pay

## 2021-01-01 ENCOUNTER — Encounter (HOSPITAL_BASED_OUTPATIENT_CLINIC_OR_DEPARTMENT_OTHER): Payer: Medicare Other | Admitting: Internal Medicine

## 2021-01-01 DIAGNOSIS — E785 Hyperlipidemia, unspecified: Secondary | ICD-10-CM | POA: Diagnosis not present

## 2021-01-01 DIAGNOSIS — M14672 Charcot's joint, left ankle and foot: Secondary | ICD-10-CM | POA: Diagnosis not present

## 2021-01-01 DIAGNOSIS — I10 Essential (primary) hypertension: Secondary | ICD-10-CM | POA: Diagnosis not present

## 2021-01-01 DIAGNOSIS — E1140 Type 2 diabetes mellitus with diabetic neuropathy, unspecified: Secondary | ICD-10-CM

## 2021-01-01 DIAGNOSIS — E11621 Type 2 diabetes mellitus with foot ulcer: Secondary | ICD-10-CM | POA: Diagnosis not present

## 2021-01-01 DIAGNOSIS — L97522 Non-pressure chronic ulcer of other part of left foot with fat layer exposed: Secondary | ICD-10-CM | POA: Diagnosis not present

## 2021-01-01 DIAGNOSIS — E1121 Type 2 diabetes mellitus with diabetic nephropathy: Secondary | ICD-10-CM

## 2021-01-01 NOTE — Progress Notes (Signed)
[name] [name], [name] [name] ([id number]) Visit Report for 01/01/2021 Chief Complaint Document Details Patient Name: Date of Service: [name] [name], [name] E. 01/01/2021 8:15 A M Medical Record Number: [id number] Patient Account Number: [id number] Date of Birth/Sex: Treating RN: [birth year] (75 y.o. [name] [name] Primary Care Provider: [name] Other Clinician: Referring Provider: Treating Provider/Extender: Lucile Crater Weeks in Treatment: 75 Information Obtained from: Patient Chief Complaint Left foot ulcer Electronic Signature(s) Signed: 01/01/2021 9:57:13 AM By: Kalman Shan DO Entered By: Kalman Shan on 01/01/2021 09:47:22 -------------------------------------------------------------------------------- HPI Details Patient Name: Date of Service: [name] [name], [name] RA E. 01/01/2021 8:15 A M Medical Record Number: [id number] Patient Account Number: [id number] Date of Birth/Sex: Treating RN: February 02, 1946 (75 y.o. [name] [name] Primary Care Provider: [name] Other Clinician: Referring Provider: Treating Provider/Extender: Mickle Asper in Treatment: 2 History of Present Illness HPI Description: 01/17/2020 upon evaluation today patient presents for initial evaluation here in our clinic concerning issues she has been having with a left medial/plantar foot ulcer. This is actually been an issue for her since October 2020. She has been seeing Dr. Doran Durand for quite some time during that course. Fortunately there is no signs of active infection at this time. Or least no mention of this to have seen in general. With that being said unfortunately I do see some signs of erythema noted today that does have me concerned about the possibility of infection at this point in the surrounding area of the wound. There is also a warm to touch at the site which is somewhat concerning. Fortunately there is no evidence of systemic infection which is great news. The patient  does have a history of diabetes mellitus type 2, Charcot foot which is what led to the wound, and hypertension. She notes that she was in a cast for some time with Dr. Doran Durand for about 8 weeks. During that time they were utilizing according to the patient silver nitrate along with a foam doughnut and then Coban to secure in place in the cast in place. With that being said I do not have the actual records to review we are going to try to get a hold of those unfortunately they would not flow over into care everywhere I did look today. She has been seeing Dr. Doran Durand and his physician assistant Larkin Ina up until the end of May and apparently is still seeing them on a regular basis every 2 weeks roughly. She has also tried Iodosorb without effect here. 01/24/2020 upon evaluation today patient actually appears to be doing quite well with regard to her wounds. She has been tolerating the dressing changes without complication. Fortunately there is no signs of active infection spreading which is good news. Her culture did show signs of Staph aureus I did place her on Augmentin due to the erythema surrounding the wound. With that being said the wound does appear to be doing better she has her longer walking cast/boot and I think that is actually good for her for the time being. I am considering reinitiating total contact cast when she gets back from vacation but next week she will actually be out of town at [employer] she knows not to get in the water but she still obviously is planning to enjoy herself she is going to take it easy on her foot however. 02/07/2020 upon evaluation today patient appears to be doing fairly well in regard to her ulcer on her foot. Fortunately there is no signs of severe infection  at this time which is great news and overall very pleased in that regard. With that being said I do think that she could still benefit from a total contact cast. Nonetheless she is using her walking boot which at  least provide some protection and that it prevents some of the friction occurring when she is ambulating. 02/14/2020 upon evaluation today patient appears to be doing well with regard to her foot ulcer. This is actually measuring a little bit smaller yet again this week. Overall very pleased with where things stand and I do not see any signs of active infection at this time which is also good news. Since she is measuring better the patient has wanting to somewhat hold off on proceeding with the total contact cast which I think is reasonable at this point. 02/28/2020 on evaluation today patient appears to be doing well in general in regard to her wound although she has a lot of callus buildup as compared to last time I saw her. This is can require sharp debridement today. I do believe she really needs the total contact cast as well which we have discussed previous. 7/23; patient comes in for a total contact cast change 03/06/2020 on evaluation today patient appears to be doing quite well with regard to her wounds. Fortunately the wound bed is measuring smaller and looking much better there is little callus noted although there is some debridement necessary today. 03/13/2020 on evaluation today patient's wound actually appears to be doing excellent which is great news. With that being said unfortunately she is having some issues currently with her left leg where she does have cellulitis it appears. This may have come from an area that rubbed underneath the cast from last week that we noted we padded that area and it looks to be doing excellent at this point but nonetheless the leg was somewhat painful, swollen, and somewhat erythematous. She also had an elevated white blood cell count of 11.5 based on what I saw on looking at her records from the med center in [facility] from where she was seen yesterday. Unfortunately with [place] having a provider on vacation there was no one here in the clinic in the afternoon  when she called therefore she went to the ER as advised. Subsequently they did not cut off the cast as they did not have anyone from orthopedics there to do so and subsequently also did not have the ability to do the Doppler for evaluation of DVT They recommended therefore given her dose of Eliquis as well as . Augmentin and sent her home to come see [place] today to have the cast taken off and then she is supposed to go back to have the study for DVT performed they are following. 03/20/2020 upon evaluation today patient appears to be doing well with regard to her foot all things considered we have not been able to use the total contact cast due to the infection that she had last week. She has been on the doxycycline and she had a 10-day supply of that I do believe that is helping and her leg appears to be doing better. With that being said there is fortunately no signs of active infection systemically at this time which is good news. No fevers, chills, nausea, vomiting, or diarrhea. 03/27/2020 upon evaluation today patient appears to be doing well with regard to her foot ulcer. There does not appear to be signs of active infection which is great news. Overall I am very  pleased with where things stand at this point. 04/03/2020 upon evaluation today patient appears to be doing pretty well in regard to the overall appearance of her wound. Fortunately there is no signs of active infection at this time which is great news. No fevers, chills, nausea, vomiting, or diarrhea. With that being said she does have some blue-green drainage that actually is a little bit concerning to me for the possibility of Pseudomonas. I discussed that with the patient today. With that being said I do believe that we may be able to manage this however with the topical antibiotic cream as opposed to having to do anything oral especially since she seems to be doing so well with overall appearance of the wound. 04/10/2020 on evaluation  today patient appears to be doing about the same roughly in regard to the overall size of her wound. With that being said she fortunately has not shown any signs of worsening overall which is good news. I do believe that she is doing a great job trying to offload but again she may still do better with the cast. I do not see in the blue-green drainage that we noticed previously I do believe the gentamicin help in this regard. 04/17/2020 on evaluation today patient's wound appears to be doing about the same at this point. There is no significant improvement at this point. No fever chills noted. She is up for put the cast back on the day. That she states in a couple weeks she will need to have this off to go to a workshop. 04/24/2020 on evaluation today patient appears to be doing significantly better in regard to her wound. Fortunately there is no signs of active infection and overall feel like she is making great progress the cast seems to have done excellent for her. 05/01/2020 upon evaluation today patient presents for reevaluation she really does not appear to be doing too badly in regard to the actual wound on the left foot we have been managing. Unfortunately she has bilateral lower extremity edema with blisters between the webspace of her first and second toe on both feet. She has a tremendous amount of edema in the legs which I think is where this is coming from it does not appear to be infected but nonetheless I do believe this is can be something that needs to be addressed today. Obviously this means we probably will not be putting the cast on at this point. She attributes this to the fact that she was sitting with her feet on the floor much longer during a conference last week she had a great time but unfortunately had a lot of complications as a result. 05/08/2020 upon evaluation today patient appears to be doing somewhat better in regard to her wounds at this time. Fortunately there is no signs of  active infection which is great news. With that being said I do believe that the blisters have ruptured and unfortunately did not just reattach I will remove some of the blistered tissue today. With that being said I do think the wound itself on the plantar aspect of left foot does need to have sharp debridement. 05/15/2020 upon evaluation today patient appears to be doing about the same in regard to her foot ulcer. Unfortunately in the past week her husband had a fall where he sustained a mild traumatic brain bleed. Fortunately he is doing better but being that he was in the hospital she had a walk on this a lot more. The wound  does not appear to be any better is also not really appearing to be significantly worse which is good news. There is no signs of active infection at this time. 10/14; patient with a small diabetic wound on the medial part of her left foot. We have been using silver collagen a total contact cast making good progress. I think the patient had a series of blisters on her dorsal foot probably secondary to having her legs recumbent for 3 days while in a conference in [place]. We wrapped her leg last week these are all healed. We did not previously have her in compression on the right leg. 05/29/2020 upon evaluation today patient appears to be doing well with regard to the wound on the plantar aspect of her foot medially. This is measuring smaller and looking much better than last time I saw her. Again when I did see her last was 2 weeks back and the wound was significantly larger. I do believe the cast is helping and I believe the collagen is a good option for her. 06/05/2020 on evaluation today patient appears to be doing well with regard to her foot ulcer this is actually measuring significantly better and overall I feel like she is doing excellent. There is no signs of active infection at this time. 06/12/2020 upon evaluation today patient actually continues to show signs of good  improvement which is excellent news. I am extremely pleased with how she seems to be progressing at this point in regard to her wound. There is still some depth to the wound but I do believe the collagen is helping her quite a bit. 06/19/2020 upon evaluation today patient appears to be doing well with regard to her plantar foot ulcer. She is actually making excellent progress and in fact this appears to be almost completely healed. With that being said I do believe that the patient is going to actually require 1 more week in the cast although after that I am hopeful she will be ready for discharge. 06/26/2020 on evaluation today patient appears to be doing well in regard to her wound currently. Fortunately there is no signs of active infection in general I feel like she is doing excellent. This appears to be completely healed I think she is ready to come out of the cast. 11/29; patient comes back in the clinic today with a very quick reopening in the exact same area on the medial plantar foot. She had been healed out last time. She went back into some new balance shoes that she got at [employer] with a custom insert. As it turns out this wound also happened when wearing these shoes although there was some modification made I think with the wound initially happened they added some foam around the wound area. This obviously is not going to be sufficient. 07/17/2020 on evaluation today patient appears to be doing well with regard to her wound. Fortunately she seems to be making good progress. Unfortunately she was in the hospital due to a issue with colitis and had just been discharged today in fact. She tells me that her biggest concern here is that a lot of her numbers especially her creatinine were somewhat elevated and problematic. She is can be following up with her provider in order to have a further work-up at this time. With that being said she did need to come back in for a cast change. She did not  allow them to remove the cast due to the fact that she did  not feel like that was the issue whatsoever and indeed it does not appear that was the case her wound appears to be doing excellent today. 07/24/2020 upon evaluation today patient appears to be doing well in regard to her foot ulcer. She still has a small opening but this is showing signs of excellent improvement overall but they were very close to complete resolution. No fevers, chills, nausea, vomiting, or diarrhea. 07/31/2020 upon evaluation today patient actually appears to be doing excellent she is actually completely healed this is great news. Fortunately there is no signs of active infection at this time. No fevers, chills, nausea, vomiting, or diarrhea. The patient tells me that she did see Dr. Paulla Dolly in order to get to [place] so that she can have a custom shoe made. She saw him earlier last week. She does have an appointment with Liliane Channel on the sixth I believe on January she tells me Readmission: 08/28/2020 on evaluation today patient appears to be doing poorly in regard to her wound currently. She tells me this has reopened. The good news that she did get measured for and actually her shoes are on the way in from [facility].. With that being said she has been trying to stay off of this is much as possible using her wheelchair around home. Nonetheless has been somewhat difficult. The good news is her shoes should be here next week 09/04/2020 upon evaluation today patient appears to be doing well with regard to her wound. There is a little bit of callus buildup but nothing too significant she does tell me she was very active this week. Fortunately there is no signs of systemic infection at this point. The dorsal foot wound actually appears to be doing better. I think this is healed. 09/11/2020 upon evaluation today patient appears to actually be doing quite well in my opinion based on what I am seeing today. Fortunately there is no signs  of infection in fact her mother is certain that this is even open any longer based on what I see. Fortunately I think the patient has been doing everything she can to try to keep this under control. 09/18/2020 upon evaluation today patient appears to be doing about the same in regard to her foot ulcer. She has been tolerating the dressing changes without complication. Fortunately there is no sign of active infection at this time. No fevers, chills, nausea, vomiting, or diarrhea. She is going require some sharp debridement today. 09/25/2020 upon evaluation today patient appears to be doing well with regard to his wounds she has been tolerating the dressing changes without complication. Her wound appears to be completely healed based on what I am seeing at this point. There does not appear to be any signs of active infection at this time which is great news. No fevers, chills, nausea, vomiting, or diarrhea. She did get the cushion for her foot as well that she ordered to try to help keep pressure off of this area. That looks like it may be very beneficial for her to be honest. 10/02/2020 upon evaluation today patient appears to be doing more poorly in regard to her foot ulcer. Unfortunately this has reopened since we saw her last week. It apparently did not take too long at all for this to happen. She is obviously somewhat disappointed as she was hopeful that that would be time this thing would stay closed. Fortunately there is no evidence of active infection at this point. No fevers, chills, nausea, vomiting, or  diarrhea. With that being said I am good have to perform a little bit of sharp debridement clear away some of the debris currently. Including a minimal amount of callus at this time. 10/16/2020 upon evaluation today patient appears to be doing about the same in regard to her foot ulcer unfortunately. There does not appear to be any signs of active infection which is great news. No fever chills noted  she has been tolerating the dressing changes without complication which is great news. Unfortunately she tells me that she has been very depressed about the situation is getting very frustrating to her that she continues to have issues despite everything that she is trying to do to stay off of her foot. She is extremely discouraged and I do hate to hear this. Obviously we have been hopeful that if she got her shoes that would make a difference she is actually can be getting those tomorrow but I am not certain that that alone is good to be enough to get this to heal. We may end up having to consider going back into a total contact cast to get this to heal and then subsequently once we get her healed get her into her new diabetic shoes which will and my hope anyway keep this from reopening again. 10/23/2020 upon evaluation today patient appears to be doing a little worse both in regard to the size of her wound as well as in regard to the fact that she has erythema surrounding the wound and wrap around the lateral part of her foot where she tells me has been somewhat sore. Fortunately there does not appear to be any evidence of infection systemically but locally definitely there is erythema and warmth consistent with local cellulitis. I am glad to remove some of the callus as well. 10/30/2020 upon evaluation today patient appears to be doing well with regard to her wound on the plantar aspect of her foot. This is significantly improved compared to last week. 10/30/2020 upon evaluation today patient actually is making excellent progress her wound appears to show signs of great improvement which is wonderful and that she is extremely pleased to hear this. She actually leaves Sunday to go on her trip with her half sister and friends. 11/13/2020 on evaluation today patient's wound actually appears to be doing quite well. There does not appear to be any signs of infection it has been 2 weeks since have seen her  she does have a little bit of callus buildup here today but at the same time I do not believe the vacation time set her back any just has not really made a lot of significant improvement. She is done with her antibiotics at this point. 11/20/2020 upon evaluation today patient appears to be doing poorly in regard to her foot. In fact this appears to be showing signs of Infection. She has erythema and warmth that is concerning. I know she is very discouraged as this seems to be a recurrent issue. I think we may need to delve further into the possibility of something deeper going on here as far as a structural infection. 11/27/2020 patient presents for 1 week follow-up. She had a culture of her left foot ulcer that grew staph aureus sensitive to Augmentin. This was called in by [place], [place] this morning. She is currently on doxycycline for previous culture result. Patient also had an x-ray done of her left foot that had conflicting results. We asked for clarification and was told we would have  clarification on Monday. Patient states overall she is doing well. She tries to stay off of her foot is much as possible. 12/04/2020 patient presents for 1 week follow-up. She is currently taking Augmentin and tolerating this well. It was confirmed that her x-ray had no acute osseous abnormalities. We switched her to collagen from silver alginate last week. Patient is overall doing well and trying to stay off her foot is much as possible. She has no complaints today. 12/11/2020 upon evaluation today patient appears to be doing well with regard to her wound. She has been tolerating the dressing changes without complication. Fortunately there is no signs of active infection noted at this point. I think she is definitely ready to go back into the cast. 12/18/2020 upon evaluation today patient appears to be doing well with regard to her wound. She has been tolerating the dressing changes without complication. Fortunately there  is no signs of active infection at this time. No fevers, chills, nausea, vomiting, or diarrhea. 12/25/2020 upon evaluation today patient appears to be doing well in general in regard to her plantar foot ulcer. With that being said she tells me currently that there does not appear to be any signs of pain or problems which is great in fact the wound appears to be almost completely closed which is also excellent news. I do not see any signs of infection which is great news as well. No fevers, chills, nausea, vomiting, or diarrhea. 5/25; patient presents for 1 week follow-up. She has had the cast in place for the past week and has tolerated this well. She states she is getting orthotics on 5/10. She currently denies signs of infection and has no issues today. Electronic Signature(s) Signed: 01/01/2021 9:57:13 AM By: Kalman Shan DO Entered By: Kalman Shan on 01/01/2021 09:47:57 -------------------------------------------------------------------------------- Physical Exam Details Patient Name: Date of Service: [name] [name], [name] RA E. 01/01/2021 8:15 A M Medical Record Number: [id number] Patient Account Number: [id number] Date of Birth/Sex: Treating RN: Mar 02, 1946 (75 y.o. [name] [name] Primary Care Provider: [name] Other Clinician: Referring Provider: Treating Provider/Extender: Mickle Asper in Treatment: 50 Constitutional respirations regular, non-labored and within target range for patient.. Cardiovascular 2+ dorsalis pedis/posterior tibialis pulses. Psychiatric pleasant and cooperative. Notes Left foot: Epithelialized previous wound site. Very minimal callus formation. No signs of infection. Electronic Signature(s) Signed: 01/01/2021 9:57:13 AM By: Kalman Shan DO Entered By: Kalman Shan on 01/01/2021 09:48:51 -------------------------------------------------------------------------------- Physician Orders Details Patient Name: Date of  Service: [name] [name], [name] RA E. 01/01/2021 8:15 A M Medical Record Number: [id number] Patient Account Number: [id number] Date of Birth/Sex: Treating RN: 1946/03/25 (75 y.o. [name] [name] Primary Care Provider: [name] Other Clinician: Referring Provider: Treating Provider/Extender: Mickle Asper in Treatment: 38 Verbal / Phone Orders: No Diagnosis Coding ICD-10 Coding Code Description E11.621 Type 2 diabetes mellitus with foot ulcer L97.522 Non-pressure chronic ulcer of other part of left foot with fat layer exposed M14.672 Charcot's joint, left ankle and foot E11.40 Type 2 diabetes mellitus with diabetic neuropathy, unspecified Discharge From [facility] [facility] Discharge from [place] - follow up with [employer] for custom diabetic shoes Bathing/ Shower/ Hygiene May shower and wash wound with soap and water. Off-Loading Open toe surgical shoe to: - left foot until appointment with [employer] Other: - foam callous cushion to healed area bottom of left foot daily Additional Orders / Instructions Follow Nutritious Diet Electronic Signature(s) Signed: 01/01/2021 9:57:13 AM By: Kalman Shan DO Entered By: Kalman Shan  on 01/01/2021 09:49:05 -------------------------------------------------------------------------------- Problem List Details Patient Name: Date of Service: [name] [name], [name] E. 01/01/2021 8:15 A M Medical Record Number: [id number] Patient Account Number: [id number] Date of Birth/Sex: Treating RN: 24-Dec-1945 (75 y.o. [name] [name] Primary Care Provider: [name] Other Clinician: Referring Provider: Treating Provider/Extender: Mickle Asper in Treatment: 57 Active Problems ICD-10 Encounter Code Description Active Date MDM Diagnosis E11.621 Type 2 diabetes mellitus with foot ulcer 01/17/2020 No Yes L97.522 Non-pressure chronic ulcer of other part of left foot with fat layer exposed 01/17/2020 No Yes M14.672  Charcot's joint, left ankle and foot 01/17/2020 No Yes E11.40 Type 2 diabetes mellitus with diabetic neuropathy, unspecified 01/17/2020 No Yes Inactive Problems ICD-10 Code Description Active Date Inactive Date I10 Essential (primary) hypertension 01/17/2020 01/17/2020 Resolved Problems Electronic Signature(s) Signed: 01/01/2021 9:57:13 AM By: Kalman Shan DO Entered By: Kalman Shan on 01/01/2021 09:47:10 -------------------------------------------------------------------------------- Progress Note Details Patient Name: Date of Service: [name] [name], [name] RA E. 01/01/2021 8:15 A M Medical Record Number: [id number] Patient Account Number: [id number] Date of Birth/Sex: Treating RN: 10-27-1945 (75 y.o. [name] [name] Primary Care Provider: [name] Other Clinician: Referring Provider: Treating Provider/Extender: Mickle Asper in Treatment: 19 Subjective Chief Complaint Information obtained from Patient Left foot ulcer History of Present Illness (HPI) 01/17/2020 upon evaluation today patient presents for initial evaluation here in our clinic concerning issues she has been having with a left medial/plantar foot ulcer. This is actually been an issue for her since October 2020. She has been seeing Dr. Doran Durand for quite some time during that course. Fortunately there is no signs of active infection at this time. Or least no mention of this to have seen in general. With that being said unfortunately I do see some signs of erythema noted today that does have me concerned about the possibility of infection at this point in the surrounding area of the wound. There is also a warm to touch at the site which is somewhat concerning. Fortunately there is no evidence of systemic infection which is great news. The patient does have a history of diabetes mellitus type 2, Charcot foot which is what led to the wound, and hypertension. She notes that she was in a cast for some time with  Dr. Doran Durand for about 8 weeks. During that time they were utilizing according to the patient silver nitrate along with a foam doughnut and then Coban to secure in place in the cast in place. With that being said I do not have the actual records to review we are going to try to get a hold of those unfortunately they would not flow over into care everywhere I did look today. She has been seeing Dr. Doran Durand and his physician assistant Larkin Ina up until the end of May and apparently is still seeing them on a regular basis every 2 weeks roughly. She has also tried Iodosorb without effect here. 01/24/2020 upon evaluation today patient actually appears to be doing quite well with regard to her wounds. She has been tolerating the dressing changes without complication. Fortunately there is no signs of active infection spreading which is good news. Her culture did show signs of Staph aureus I did place her on Augmentin due to the erythema surrounding the wound. With that being said the wound does appear to be doing better she has her longer walking cast/boot and I think that is actually good for her for the time being. I am considering reinitiating total contact cast  when she gets back from vacation but next week she will actually be out of town at [employer] she knows not to get in the water but she still obviously is planning to enjoy herself she is going to take it easy on her foot however. 02/07/2020 upon evaluation today patient appears to be doing fairly well in regard to her ulcer on her foot. Fortunately there is no signs of severe infection at this time which is great news and overall very pleased in that regard. With that being said I do think that she could still benefit from a total contact cast. Nonetheless she is using her walking boot which at least provide some protection and that it prevents some of the friction occurring when she is ambulating. 02/14/2020 upon evaluation today patient appears to be  doing well with regard to her foot ulcer. This is actually measuring a little bit smaller yet again this week. Overall very pleased with where things stand and I do not see any signs of active infection at this time which is also good news. Since she is measuring better the patient has wanting to somewhat hold off on proceeding with the total contact cast which I think is reasonable at this point. 02/28/2020 on evaluation today patient appears to be doing well in general in regard to her wound although she has a lot of callus buildup as compared to last time I saw her. This is can require sharp debridement today. I do believe she really needs the total contact cast as well which we have discussed previous. 7/23; patient comes in for a total contact cast change 03/06/2020 on evaluation today patient appears to be doing quite well with regard to her wounds. Fortunately the wound bed is measuring smaller and looking much better there is little callus noted although there is some debridement necessary today. 03/13/2020 on evaluation today patient's wound actually appears to be doing excellent which is great news. With that being said unfortunately she is having some issues currently with her left leg where she does have cellulitis it appears. This may have come from an area that rubbed underneath the cast from last week that we noted we padded that area and it looks to be doing excellent at this point but nonetheless the leg was somewhat painful, swollen, and somewhat erythematous. She also had an elevated white blood cell count of 11.5 based on what I saw on looking at her records from the med center in [facility] from where she was seen yesterday. Unfortunately with [place] having a provider on vacation there was no one here in the clinic in the afternoon when she called therefore she went to the ER as advised. Subsequently they did not cut off the cast as they did not have anyone from orthopedics there to do so  and subsequently also did not have the ability to do the Doppler for evaluation of DVT They recommended therefore given her dose of Eliquis as well as . Augmentin and sent her home to come see [place] today to have the cast taken off and then she is supposed to go back to have the study for DVT performed they are following. 03/20/2020 upon evaluation today patient appears to be doing well with regard to her foot all things considered we have not been able to use the total contact cast due to the infection that she had last week. She has been on the doxycycline and she had a 10-day supply of that I  do believe that is helping and her leg appears to be doing better. With that being said there is fortunately no signs of active infection systemically at this time which is good news. No fevers, chills, nausea, vomiting, or diarrhea. 03/27/2020 upon evaluation today patient appears to be doing well with regard to her foot ulcer. There does not appear to be signs of active infection which is great news. Overall I am very pleased with where things stand at this point. 04/03/2020 upon evaluation today patient appears to be doing pretty well in regard to the overall appearance of her wound. Fortunately there is no signs of active infection at this time which is great news. No fevers, chills, nausea, vomiting, or diarrhea. With that being said she does have some blue-green drainage that actually is a little bit concerning to me for the possibility of Pseudomonas. I discussed that with the patient today. With that being said I do believe that we may be able to manage this however with the topical antibiotic cream as opposed to having to do anything oral especially since she seems to be doing so well with overall appearance of the wound. 04/10/2020 on evaluation today patient appears to be doing about the same roughly in regard to the overall size of her wound. With that being said she fortunately has not shown any signs  of worsening overall which is good news. I do believe that she is doing a great job trying to offload but again she may still do better with the cast. I do not see in the blue-green drainage that we noticed previously I do believe the gentamicin help in this regard. 04/17/2020 on evaluation today patient's wound appears to be doing about the same at this point. There is no significant improvement at this point. No fever chills noted. She is up for put the cast back on the day. That she states in a couple weeks she will need to have this off to go to a workshop. 04/24/2020 on evaluation today patient appears to be doing significantly better in regard to her wound. Fortunately there is no signs of active infection and overall feel like she is making great progress the cast seems to have done excellent for her. 05/01/2020 upon evaluation today patient presents for reevaluation she really does not appear to be doing too badly in regard to the actual wound on the left foot we have been managing. Unfortunately she has bilateral lower extremity edema with blisters between the webspace of her first and second toe on both feet. She has a tremendous amount of edema in the legs which I think is where this is coming from it does not appear to be infected but nonetheless I do believe this is can be something that needs to be addressed today. Obviously this means we probably will not be putting the cast on at this point. She attributes this to the fact that she was sitting with her feet on the floor much longer during a conference last week she had a great time but unfortunately had a lot of complications as a result. 05/08/2020 upon evaluation today patient appears to be doing somewhat better in regard to her wounds at this time. Fortunately there is no signs of active infection which is great news. With that being said I do believe that the blisters have ruptured and unfortunately did not just reattach I will remove  some of the blistered tissue today. With that being said I do think the wound  itself on the plantar aspect of left foot does need to have sharp debridement. 05/15/2020 upon evaluation today patient appears to be doing about the same in regard to her foot ulcer. Unfortunately in the past week her husband had a fall where he sustained a mild traumatic brain bleed. Fortunately he is doing better but being that he was in the hospital she had a walk on this a lot more. The wound does not appear to be any better is also not really appearing to be significantly worse which is good news. There is no signs of active infection at this time. 10/14; patient with a small diabetic wound on the medial part of her left foot. We have been using silver collagen a total contact cast making good progress. I think the patient had a series of blisters on her dorsal foot probably secondary to having her legs recumbent for 3 days while in a conference in [place]. We wrapped her leg last week these are all healed. We did not previously have her in compression on the right leg. 05/29/2020 upon evaluation today patient appears to be doing well with regard to the wound on the plantar aspect of her foot medially. This is measuring smaller and looking much better than last time I saw her. Again when I did see her last was 2 weeks back and the wound was significantly larger. I do believe the cast is helping and I believe the collagen is a good option for her. 06/05/2020 on evaluation today patient appears to be doing well with regard to her foot ulcer this is actually measuring significantly better and overall I feel like she is doing excellent. There is no signs of active infection at this time. 06/12/2020 upon evaluation today patient actually continues to show signs of good improvement which is excellent news. I am extremely pleased with how she seems to be progressing at this point in regard to her wound. There is still some  depth to the wound but I do believe the collagen is helping her quite a bit. 06/19/2020 upon evaluation today patient appears to be doing well with regard to her plantar foot ulcer. She is actually making excellent progress and in fact this appears to be almost completely healed. With that being said I do believe that the patient is going to actually require 1 more week in the cast although after that I am hopeful she will be ready for discharge. 06/26/2020 on evaluation today patient appears to be doing well in regard to her wound currently. Fortunately there is no signs of active infection in general I feel like she is doing excellent. This appears to be completely healed I think she is ready to come out of the cast. 11/29; patient comes back in the clinic today with a very quick reopening in the exact same area on the medial plantar foot. She had been healed out last time. She went back into some new balance shoes that she got at [employer] with a custom insert. As it turns out this wound also happened when wearing these shoes although there was some modification made I think with the wound initially happened they added some foam around the wound area. This obviously is not going to be sufficient. 07/17/2020 on evaluation today patient appears to be doing well with regard to her wound. Fortunately she seems to be making good progress. Unfortunately she was in the hospital due to a issue with colitis and had just been discharged today in fact.  She tells me that her biggest concern here is that a lot of her numbers especially her creatinine were somewhat elevated and problematic. She is can be following up with her provider in order to have a further work-up at this time. With that being said she did need to come back in for a cast change. She did not allow them to remove the cast due to the fact that she did not feel like that was the issue whatsoever and indeed it does not appear that was the case her  wound appears to be doing excellent today. 07/24/2020 upon evaluation today patient appears to be doing well in regard to her foot ulcer. She still has a small opening but this is showing signs of excellent improvement overall but they were very close to complete resolution. No fevers, chills, nausea, vomiting, or diarrhea. 07/31/2020 upon evaluation today patient actually appears to be doing excellent she is actually completely healed this is great news. Fortunately there is no signs of active infection at this time. No fevers, chills, nausea, vomiting, or diarrhea. The patient tells me that she did see Dr. Paulla Dolly in order to get to [employer] so that she can have a custom shoe made. She saw him earlier last week. She does have an appointment with Liliane Channel on the sixth I believe on January she tells me Readmission: 08/28/2020 on evaluation today patient appears to be doing poorly in regard to her wound currently. She tells me this has reopened. The good news that she did get measured for and actually her shoes are on the way in from [facility].. With that being said she has been trying to stay off of this is much as possible using her wheelchair around home. Nonetheless has been somewhat difficult. The good news is her shoes should be here next week 09/04/2020 upon evaluation today patient appears to be doing well with regard to her wound. There is a little bit of callus buildup but nothing too significant she does tell me she was very active this week. Fortunately there is no signs of systemic infection at this point. The dorsal foot wound actually appears to be doing better. I think this is healed. 09/11/2020 upon evaluation today patient appears to actually be doing quite well in my opinion based on what I am seeing today. Fortunately there is no signs of infection in fact her mother is certain that this is even open any longer based on what I see. Fortunately I think the patient has been doing everything  she can to try to keep this under control. 09/18/2020 upon evaluation today patient appears to be doing about the same in regard to her foot ulcer. She has been tolerating the dressing changes without complication. Fortunately there is no sign of active infection at this time. No fevers, chills, nausea, vomiting, or diarrhea. She is going require some sharp debridement today. 09/25/2020 upon evaluation today patient appears to be doing well with regard to his wounds she has been tolerating the dressing changes without complication. Her wound appears to be completely healed based on what I am seeing at this point. There does not appear to be any signs of active infection at this time which is great news. No fevers, chills, nausea, vomiting, or diarrhea. She did get the cushion for her foot as well that she ordered to try to help keep pressure off of this area. That looks like it may be very beneficial for her to be honest. 10/02/2020 upon  evaluation today patient appears to be doing more poorly in regard to her foot ulcer. Unfortunately this has reopened since we saw her last week. It apparently did not take too long at all for this to happen. She is obviously somewhat disappointed as she was hopeful that that would be time this thing would stay closed. Fortunately there is no evidence of active infection at this point. No fevers, chills, nausea, vomiting, or diarrhea. With that being said I am good have to perform a little bit of sharp debridement clear away some of the debris currently. Including a minimal amount of callus at this time. 10/16/2020 upon evaluation today patient appears to be doing about the same in regard to her foot ulcer unfortunately. There does not appear to be any signs of active infection which is great news. No fever chills noted she has been tolerating the dressing changes without complication which is great news. Unfortunately she tells me that she has been very depressed about the  situation is getting very frustrating to her that she continues to have issues despite everything that she is trying to do to stay off of her foot. She is extremely discouraged and I do hate to hear this. Obviously we have been hopeful that if she got her shoes that would make a difference she is actually can be getting those tomorrow but I am not certain that that alone is good to be enough to get this to heal. We may end up having to consider going back into a total contact cast to get this to heal and then subsequently once we get her healed get her into her new diabetic shoes which will and my hope anyway keep this from reopening again. 10/23/2020 upon evaluation today patient appears to be doing a little worse both in regard to the size of her wound as well as in regard to the fact that she has erythema surrounding the wound and wrap around the lateral part of her foot where she tells me has been somewhat sore. Fortunately there does not appear to be any evidence of infection systemically but locally definitely there is erythema and warmth consistent with local cellulitis. I am glad to remove some of the callus as well. 10/30/2020 upon evaluation today patient appears to be doing well with regard to her wound on the plantar aspect of her foot. This is significantly improved compared to last week. 10/30/2020 upon evaluation today patient actually is making excellent progress her wound appears to show signs of great improvement which is wonderful and that she is extremely pleased to hear this. She actually leaves Sunday to go on her trip with her half sister and friends. 11/13/2020 on evaluation today patient's wound actually appears to be doing quite well. There does not appear to be any signs of infection it has been 2 weeks since have seen her she does have a little bit of callus buildup here today but at the same time I do not believe the vacation time set her back any just has not really made a lot  of significant improvement. She is done with her antibiotics at this point. 11/20/2020 upon evaluation today patient appears to be doing poorly in regard to her foot. In fact this appears to be showing signs of Infection. She has erythema and warmth that is concerning. I know she is very discouraged as this seems to be a recurrent issue. I think we may need to delve further into the possibility of something deeper  going on here as far as a structural infection. 11/27/2020 patient presents for 1 week follow-up. She had a culture of her left foot ulcer that grew staph aureus sensitive to Augmentin. This was called in by [place], [place] this morning. She is currently on doxycycline for previous culture result. Patient also had an x-ray done of her left foot that had conflicting results. We asked for clarification and was told we would have clarification on Monday. Patient states overall she is doing well. She tries to stay off of her foot is much as possible. 12/04/2020 patient presents for 1 week follow-up. She is currently taking Augmentin and tolerating this well. It was confirmed that her x-ray had no acute osseous abnormalities. We switched her to collagen from silver alginate last week. Patient is overall doing well and trying to stay off her foot is much as possible. She has no complaints today. 12/11/2020 upon evaluation today patient appears to be doing well with regard to her wound. She has been tolerating the dressing changes without complication. Fortunately there is no signs of active infection noted at this point. I think she is definitely ready to go back into the cast. 12/18/2020 upon evaluation today patient appears to be doing well with regard to her wound. She has been tolerating the dressing changes without complication. Fortunately there is no signs of active infection at this time. No fevers, chills, nausea, vomiting, or diarrhea. 12/25/2020 upon evaluation today patient appears to be doing well  in general in regard to her plantar foot ulcer. With that being said she tells me currently that there does not appear to be any signs of pain or problems which is great in fact the wound appears to be almost completely closed which is also excellent news. I do not see any signs of infection which is great news as well. No fevers, chills, nausea, vomiting, or diarrhea. 5/25; patient presents for 1 week follow-up. She has had the cast in place for the past week and has tolerated this well. She states she is getting orthotics on 5/10. She currently denies signs of infection and has no issues today. Patient History Information obtained from Patient. Family History Cancer - Father, Heart Disease - Mother, Hypertension - Mother, Stroke - Father,Mother, No family history of Diabetes, Hereditary Spherocytosis, Kidney Disease, Lung Disease, Seizures, Thyroid Problems, Tuberculosis. Social History Never smoker, Marital Status - Single, Alcohol Use - Rarely, Drug Use - No History, Caffeine Use - Daily - coffee. Medical History Respiratory Patient has history of Sleep Apnea - bipap Cardiovascular Patient has history of Deep Vein Thrombosis - right calf, Hypertension Gastrointestinal Patient has history of Colitis Endocrine Patient has history of Type II Diabetes Musculoskeletal Patient has history of Gout, Osteoarthritis Neurologic Patient has history of Neuropathy Medical A Surgical History Notes nd Cardiovascular Hyperlipidemia Integumentary (Skin) Psoriasis Musculoskeletal Charcot foot Objective Constitutional respirations regular, non-labored and within target range for patient.. Vitals Time Taken: 8:54 AM, Height: 66 in, Weight: 245 lbs, BMI: 39.5, Temperature: 98.6 F, Pulse: 82 bpm, Respiratory Rate: 17 breaths/min, Blood Pressure: 151/68 mmHg. Cardiovascular 2+ dorsalis pedis/posterior tibialis pulses. Psychiatric pleasant and cooperative. General Notes: Left foot:  Epithelialized previous wound site. Very minimal callus formation. No signs of infection. Integumentary (Hair, Skin) Wound #1RRR status is Open. Original cause of wound was Gradually Appeared. The date acquired was: 05/11/2019. The wound has been in treatment 50 weeks. The wound is located on the [place]. The wound measures 0cm length x 0cm width x 0cm  depth; 0cm^2 area and 0cm^3 volume. There is no tunneling or undermining noted. There is a none present amount of drainage noted. The wound margin is thickened. There is no granulation within the wound bed. There is no necrotic tissue within the wound bed. Assessment Active Problems ICD-10 Type 2 diabetes mellitus with foot ulcer Non-pressure chronic ulcer of other part of left foot with fat layer exposed Charcot's joint, left ankle and foot Type 2 diabetes mellitus with diabetic neuropathy, unspecified Patient has done well with a total contact cast and wound is healed today. She is getting orthotics on 5/10 and in the meantime we have given her a offloading foam pad to use in her surgical shoe. She is to call us with any questions or concerns or if the wound opens up again. Plan Discharge From [facility] Services: Discharge from [place] - follow up with [employer] for custom diabetic shoes Bathing/ Shower/ Hygiene: May shower and wash wound with soap and water. Off-Loading: Open toe surgical shoe to: - left foot until appointment with [employer] Other: - foam callous cushion to healed area bottom of left foot daily Additional Orders / Instructions: Follow Nutritious Diet 1. Discharge from our clinic due to a healed wound 2. Follow-up as needed 3. Offloading foam pad with surgical shoe Electronic Signature(s) Signed: 01/01/2021 9:57:13 AM By: Kalman Shan DO Entered By: Kalman Shan on 01/01/2021 09:50:04 -------------------------------------------------------------------------------- HxROS Details Patient  Name: Date of Service: [name] [name], [name] RA E. 01/01/2021 8:15 A M Medical Record Number: [id number] Patient Account Number: [id number] Date of Birth/Sex: Treating RN: [birth year] (75 y.o. [name] [name] Primary Care Provider: [name] Other Clinician: Referring Provider: Treating Provider/Extender: Mickle Asper in Treatment: 57 Information Obtained From Patient Respiratory Medical History: Positive for: Sleep Apnea - bipap Cardiovascular Medical History: Positive for: Deep Vein Thrombosis - right calf; Hypertension Past Medical History Notes: Hyperlipidemia Gastrointestinal Medical History: Positive for: Colitis Endocrine Medical History: Positive for: Type II Diabetes Time with diabetes: 2 years Treated with: Insulin, Oral agents Blood sugar tested every day: No Integumentary (Skin) Medical History: Past Medical History Notes: Psoriasis Musculoskeletal Medical History: Positive for: Gout; Osteoarthritis Past Medical History Notes: Charcot foot Neurologic Medical History: Positive for: Neuropathy Immunizations Pneumococcal Vaccine: Received Pneumococcal Vaccination: Yes Implantable Devices None Family and Social History Cancer: Yes - Father; Diabetes: No; Heart Disease: Yes - Mother; Hereditary Spherocytosis: No; Hypertension: Yes - Mother; Kidney Disease: No; Lung Disease: No; Seizures: No; Stroke: Yes - Father,Mother; Thyroid Problems: No; Tuberculosis: No; Never smoker; Marital Status - Single; Alcohol Use: Rarely; Drug Use: No History; Caffeine Use: Daily - coffee; Financial Concerns: No; Food, Clothing or Shelter Needs: No; Support System Lacking: No; Transportation Concerns: No Electronic Signature(s) Signed: 01/01/2021 9:57:13 AM By: Kalman Shan DO Signed: 01/01/2021 4:28:15 PM By: [name] RN, BSN Entered By: Kalman Shan on 01/01/2021  09:48:03 -------------------------------------------------------------------------------- [place] Details Patient Name: Date of Service: [name] Guarneri RBA RA E. 01/01/2021 Medical Record Number: [id number] Patient Account Number: [id number] Date of Birth/Sex: Treating RN: [birth year] (75 y.o. [name], [name] Primary Care Provider: [name] Other Clinician: Referring Provider: Treating Provider/Extender: Mickle Asper in Treatment: 50 Diagnosis Coding ICD-10 Codes Code Description E11.621 Type 2 diabetes mellitus with foot ulcer L97.522 Non-pressure chronic ulcer of other part of left foot with fat layer exposed M14.672 Charcot's joint, left ankle and foot E11.40 Type 2 diabetes mellitus with diabetic neuropathy, unspecified Facility Procedures Physician Procedures : CPT4 Code Description Modifier [id number] -  [facility] PHYS LEVEL 3 - EST PT ICD-10 Diagnosis Description E11.621 Type 2 diabetes mellitus with foot ulcer L97.522 Non-pressure chronic ulcer of other part of left foot with fat layer exposed M14.672 Charcot's  joint, left ankle and foot E11.40 Type 2 diabetes mellitus with diabetic neuropathy, unspecified Quantity: 1 Electronic Signature(s) Signed: 01/01/2021 9:57:13 AM By: Kalman Shan DO Entered By: Kalman Shan on 01/01/2021 09:50:24

## 2021-01-07 ENCOUNTER — Other Ambulatory Visit: Payer: Self-pay | Admitting: Internal Medicine

## 2021-01-07 NOTE — Progress Notes (Signed)
[name] [name], [name] [name] ([id number]) Visit Report for 01/01/2021 Arrival Information Details Patient Name: Date of Service: [name] [name], [name] E. 01/01/2021 8:15 A M Medical Record Number: [id number] Patient Account Number: [id number] Date of Birth/Sex: Treating RN: 1946-08-01 (75 y.o. [name], [name] Primary Care Laurelin Elson: [name] Other Clinician: Referring Lavilla Delamora: Treating Tajuanna Burnett/Extender: Mickle Asper in Treatment: 7 Visit Information History Since Last Visit Added or deleted any medications: No Patient Arrived: Wheel Chair Any new allergies or adverse reactions: No Arrival Time: 08:41 Had a fall or experienced change in No Accompanied By: self activities of daily living that may affect Transfer Assistance: None risk of falls: Patient Identification Verified: Yes Signs or symptoms of abuse/neglect since last visito No Secondary Verification Process Completed: Yes Hospitalized since last visit: No Patient Requires Transmission-Based Precautions: No Implantable device outside of the clinic excluding No Patient Has Alerts: No cellular tissue based products placed in the center since last visit: Has Dressing in Place as Prescribed: Yes Pain Present Now: No Electronic Signature(s) Signed: 01/01/2021 1:58:35 PM By: Sandre Kitty Entered By: Sandre Kitty on 01/01/2021 08:50:30 -------------------------------------------------------------------------------- Clinic Level of Care Assessment Details Patient Name: Date of Service: [name] [name], [name] E. 01/01/2021 8:15 A M Medical Record Number: [id number] Patient Account Number: [id number] Date of Birth/Sex: Treating RN: May 28, 1946 (75 y.o. [name] Primary Care Taher Vannote: [name] Other Clinician: Referring Arnesha Schiraldi: Treating Georjean Toya/Extender: Mickle Asper in Treatment: 50 Clinic Level of Care Assessment Items TOOL 4 Quantity Score []  - 0 Use when only an EandM is  performed on FOLLOW-UP visit ASSESSMENTS - Nursing Assessment / Reassessment X- 1 10 Reassessment of Co-morbidities (includes updates in patient status) X- 1 5 Reassessment of Adherence to Treatment Plan ASSESSMENTS - Wound and Skin A ssessment / Reassessment X - Simple Wound Assessment / Reassessment - one wound 1 5 []  - 0 Complex Wound Assessment / Reassessment - multiple wounds []  - 0 Dermatologic / Skin Assessment (not related to wound area) ASSESSMENTS - Focused Assessment []  - 0 Circumferential Edema Measurements - multi extremities []  - 0 Nutritional Assessment / Counseling / Intervention X- 1 5 Lower Extremity Assessment (monofilament, tuning fork, pulses) []  - 0 Peripheral Arterial Disease Assessment (using hand held doppler) ASSESSMENTS - Ostomy and/or Continence Assessment and Care []  - 0 Incontinence Assessment and Management []  - 0 Ostomy Care Assessment and Management (repouching, etc.) PROCESS - Coordination of Care X - Simple Patient / Family Education for ongoing care 1 15 []  - 0 Complex (extensive) Patient / Family Education for ongoing care X- 1 10 Staff obtains Programmer, systems, Records, T Results / Process Orders est []  - 0 Staff telephones HHA, Nursing Homes / Clarify orders / etc []  - 0 Routine Transfer to another Facility (non-emergent condition) []  - 0 Routine Hospital Admission (non-emergent condition) []  - 0 New Admissions / Biomedical engineer / Ordering NPWT Apligraf, etc. , []  - 0 Emergency Hospital Admission (emergent condition) X- 1 10 Simple Discharge Coordination []  - 0 Complex (extensive) Discharge Coordination PROCESS - Special Needs []  - 0 Pediatric / Minor Patient Management []  - 0 Isolation Patient Management []  - 0 Hearing / Language / Visual special needs []  - 0 Assessment of Community assistance (transportation, D/C planning, etc.) []  - 0 Additional assistance / Altered mentation []  - 0 Support Surface(s) Assessment  (bed, cushion, seat, etc.) INTERVENTIONS - Wound Cleansing / Measurement X - Simple Wound Cleansing - one wound 1 5 []  - 0 Complex Wound Cleansing -  multiple wounds X- 1 5 Wound Imaging (photographs - any number of wounds) []  - 0 Wound Tracing (instead of photographs) []  - 0 Simple Wound Measurement - one wound []  - 0 Complex Wound Measurement - multiple wounds INTERVENTIONS - Wound Dressings X - Small Wound Dressing one or multiple wounds 1 10 []  - 0 Medium Wound Dressing one or multiple wounds []  - 0 Large Wound Dressing one or multiple wounds []  - 0 Application of Medications - topical []  - 0 Application of Medications - injection INTERVENTIONS - Miscellaneous []  - 0 External ear exam []  - 0 Specimen Collection (cultures, biopsies, blood, body fluids, etc.) []  - 0 Specimen(s) / Culture(s) sent or taken to Lab for analysis []  - 0 Patient Transfer (multiple staff / Civil Service fast streamer / Similar devices) []  - 0 Simple Staple / Suture removal (25 or less) []  - 0 Complex Staple / Suture removal (26 or more) []  - 0 Hypo / Hyperglycemic Management (close monitor of Blood Glucose) []  - 0 Ankle / Brachial Index (ABI) - do not check if billed separately X- 1 5 Vital Signs Has the patient been seen at the hospital within the last three years: Yes Total Score: 85 Level Of Care: New/Established - Level 3 Electronic Signature(s) Signed: 01/01/2021 4:28:15 PM By: [name] RN, BSN Entered By: [name] on 01/01/2021 09:28:31 -------------------------------------------------------------------------------- Encounter Discharge Information Details Patient Name: Date of Service: [name] Amato RA E. 01/01/2021 8:15 A M Medical Record Number: [id number] Patient Account Number: [id number] Date of Birth/Sex: Treating RN: 02-Jul-1946 (75 y.o. [name] [name], [name] [name] Primary Care Melena Hayes: [name] Other Clinician: Referring Willamina Grieshop: Treating Jasier Calabretta/Extender: Mickle Asper in Treatment: 88 Encounter Discharge Information Items Discharge Condition: Stable Ambulatory Status: Ambulatory Discharge Destination: Home Transportation: Private Auto Accompanied By: self Schedule Follow-up Appointment: Yes Clinical Summary of Care: Patient Declined Electronic Signature(s) Signed: 01/01/2021 4:26:30 PM By: Rhae Hammock RN Entered By: Rhae Hammock on 01/01/2021 09:56:03 -------------------------------------------------------------------------------- Lower Extremity Assessment Details Patient Name: Date of Service: [name] Amato RA E. 01/01/2021 8:15 A M Medical Record Number: [id number] Patient Account Number: [id number] Date of Birth/Sex: Treating RN: Apr 11, 1946 (75 y.o. [name] Primary Care Zamora Colton: [name] Other Clinician: Referring Belita Warsame: Treating Alahia Whicker/Extender: Lucile Crater Weeks in Treatment: 50 Edema Assessment Assessed: [Left: No] [Right: No] Edema: [Left: Ye] [Right: s] Calf Left: Right: Point of Measurement: 29 cm From Medial Instep 38 cm Ankle Left: Right: Point of Measurement: 12 cm From Medial Instep 23 cm Vascular Assessment Pulses: Dorsalis Pedis Palpable: [Left:Yes] Electronic Signature(s) Signed: 01/01/2021 4:40:56 PM By: Levan Hurst RN, BSN Entered By: Levan Hurst on 01/01/2021 08:58:24 -------------------------------------------------------------------------------- Multi Wound Chart Details Patient Name: Date of Service: [name] Amato RA E. 01/01/2021 8:15 A M Medical Record Number: [id number] Patient Account Number: [id number] Date of Birth/Sex: Treating RN: May 01, 1946 (75 y.o. [name] Primary Care Micheale Schlack: [name] Other Clinician: Referring Paisely Brick: Treating Azad Calame/Extender: Mickle Asper in Treatment: 50 Vital Signs Height(in): 66 Pulse(bpm): 82 Weight(lbs): 245 Blood Pressure(mmHg): 151/68 Body Mass  Index(BMI): 40 Temperature(F): 98.6 Respiratory Rate(breaths/min): 17 Photos: [1RRR:No Photos Left, Medial, Plantar Foot] [N/A:N/A N/A] Wound Location: [1RRR:Gradually Appeared] [N/A:N/A] Wounding Event: [1RRR:Diabetic Wound/Ulcer of the Lower] [N/A:N/A] Primary Etiology: [1RRR:Extremity Sleep Apnea, Deep Vein Thrombosis,] [N/A:N/A] Comorbid History: [1RRR:Hypertension, Colitis, Type II Diabetes, Gout, Osteoarthritis, Neuropathy 05/11/2019] [N/A:N/A] Date Acquired: [1RRR:50] [N/A:N/A] Weeks of Treatment: [1RRR:Open] [N/A:N/A] Wound Status: [1RRR:Yes] [N/A:N/A] Wound Recurrence: [1RRR:0x0x0] [N/A:N/A] Measurements L x W x D (  cm) [1RRR:0] [N/A:N/A] A (cm) : rea [1RRR:0] [N/A:N/A] Volume (cm) : [1RRR:100.00%] [N/A:N/A] % Reduction in A rea: [1RRR:100.00%] [N/A:N/A] % Reduction in Volume: [1RRR:Grade 2] [N/A:N/A] Classification: [1RRR:None Present] [N/A:N/A] Exudate A mount: [1RRR:Thickened] [N/A:N/A] Wound Margin: [1RRR:None Present (0%)] [N/A:N/A] Granulation A mount: [1RRR:None Present (0%)] [N/A:N/A] Necrotic A mount: [1RRR:Fascia: No] [N/A:N/A] Exposed Structures: [1RRR:Fat Layer (Subcutaneous Tissue): No Tendon: No Muscle: No Joint: No Bone: No Large (67-100%)] [N/A:N/A] Treatment Notes Electronic Signature(s) Signed: 01/01/2021 9:57:13 AM By: Kalman Shan DO Signed: 01/01/2021 4:28:15 PM By: [name] RN, BSN Entered By: Kalman Shan on 01/01/2021 09:47:15 -------------------------------------------------------------------------------- Multi-Disciplinary Care Plan Details Patient Name: Date of Service: [name] Amato RA E. 01/01/2021 8:15 A M Medical Record Number: [id number] Patient Account Number: [id number] Date of Birth/Sex: Treating RN: [birth year] (75 y.o. [name] Primary Care Sheilyn Boehlke: [name] Other Clinician: Referring Aryon Nham: Treating Latanga Nedrow/Extender: Mickle Asper in Treatment: [place]  reviewed with physician Active Inactive Electronic Signature(s) Signed: 01/01/2021 4:28:15 PM By: [name] RN, BSN Entered By: [name] on 01/01/2021 09:22:11 -------------------------------------------------------------------------------- Pain Assessment Details Patient Name: Date of Service: [name] Amato RA E. 01/01/2021 8:15 A M Medical Record Number: [id number] Patient Account Number: [id number] Date of Birth/Sex: Treating RN: October 07, 1945 (75 y.o. [name] Primary Care Raylee Strehl: [name] Other Clinician: Referring Elaine Middleton: Treating Benetta Maclaren/Extender: Mickle Asper in Treatment: 69 Active Problems Location of Pain Severity and Description of Pain Patient Has Paino No Site Locations Pain Management and Medication Current Pain Management: Electronic Signature(s) Signed: 01/01/2021 1:58:35 PM By: Sandre Kitty Signed: 01/01/2021 4:28:15 PM By: [name] RN, BSN Entered By: Sandre Kitty on 01/01/2021 08:54:59 -------------------------------------------------------------------------------- Patient/Caregiver Education Details Patient Name: Date of Service: [name] Amato RA E. 5/25/2022andnbsp8:15 A M Medical Record Number: [id number] Patient Account Number: [id number] Date of Birth/Gender: Treating RN: 1945/10/26 (75 y.o. [name] Primary Care Physician: [name] Other Clinician: Referring Physician: Treating Physician/Extender: Mickle Asper in Treatment: 14 Education Assessment Education Provided To: Patient Education Topics Provided Offloading: Methods: Explain/Verbal Responses: Reinforcements needed, State content correctly Wound/Skin Impairment: Methods: Explain/Verbal Responses: Reinforcements needed, State content correctly Electronic Signature(s) Signed: 01/01/2021 4:28:15 PM By: [name] RN, BSN Entered By: [name] on 01/01/2021  09:22:44 -------------------------------------------------------------------------------- Wound Assessment Details Patient Name: Date of Service: [place], [name] 01/01/2021 8:15 A M Medical Record Number: [id number] Patient Account Number: [id number] Date of Birth/Sex: Treating RN: 04/09/1946 (75 y.o. [name] Primary Care Margean Korell: [name] Other Clinician: Referring Desani Sprung: Treating Jigar Zielke/Extender: Lucile Crater Weeks in Treatment: 50 Wound Status Wound Number: 1RRR Primary Diabetic Wound/Ulcer of the Lower Extremity Etiology: Wound Location: Left, Medial, Plantar Foot Wound Healed - Epithelialized Wounding Event: Gradually Appeared Status: Date Acquired: 05/11/2019 Comorbid Sleep Apnea, Deep Vein Thrombosis, Hypertension, Colitis, Type Weeks Of Treatment: 50 History: II Diabetes, Gout, Osteoarthritis, Neuropathy Clustered Wound: No Photos Wound Measurements Length: (cm) Width: (cm) Depth: (cm) Area: (cm) Volume: (cm) 0 % Reduction in Area: 100% 0 % Reduction in Volume: 100% 0 Epithelialization: Large (67-100%) 0 Tunneling: No 0 Undermining: No Wound Description Classification: Grade 2 Wound Margin: Thickened Exudate Amount: None Present Foul Odor After Cleansing: No Slough/Fibrino No Wound Bed Granulation Amount: None Present (0%) Exposed Structure Necrotic Amount: None Present (0%) Fascia Exposed: No Fat Layer (Subcutaneous Tissue) Exposed: No Tendon Exposed: No Muscle Exposed: No Joint Exposed: No Bone Exposed: No Treatment Notes Wound #1RRR (Foot) Wound Laterality: Plantar, Left, Medial  Cleanser Peri-Wound Care Topical Primary Dressing Secondary Dressing Secured With Compression Wrap Compression Stockings Add-Ons Electronic Signature(s) Signed: 01/01/2021 4:28:15 PM By: [name] RN, BSN Signed: 01/07/2021 5:24:49 PM By: Sandre Kitty Entered By: Sandre Kitty on 01/01/2021  16:00:18 -------------------------------------------------------------------------------- Vitals Details Patient Name: Date of Service: [place], [name] 01/01/2021 8:15 A M Medical Record Number: [id number] Patient Account Number: [id number] Date of Birth/Sex: Treating RN: August 14, 1945 (75 y.o. [name] Primary Care Anh Mangano: [name] Other Clinician: Referring Aidenjames Heckmann: Treating Marrah Vanevery/Extender: Mickle Asper in Treatment: 50 Vital Signs Time Taken: 08:54 Temperature (F): 98.6 Height (in): 66 Pulse (bpm): 82 Weight (lbs): 245 Respiratory Rate (breaths/min): 17 Body Mass Index (BMI): 39.5 Blood Pressure (mmHg): 151/68 Reference Range: 80 - 120 mg / dl Electronic Signature(s) Signed: 01/01/2021 1:58:35 PM By: Sandre Kitty Entered By: Sandre Kitty on 01/01/2021 08:54:52

## 2021-01-08 ENCOUNTER — Other Ambulatory Visit: Payer: Self-pay

## 2021-01-08 ENCOUNTER — Encounter (HOSPITAL_BASED_OUTPATIENT_CLINIC_OR_DEPARTMENT_OTHER): Payer: Medicare Other | Attending: Physician Assistant | Admitting: Physician Assistant

## 2021-01-08 DIAGNOSIS — E1140 Type 2 diabetes mellitus with diabetic neuropathy, unspecified: Secondary | ICD-10-CM | POA: Diagnosis not present

## 2021-01-08 DIAGNOSIS — E1151 Type 2 diabetes mellitus with diabetic peripheral angiopathy without gangrene: Secondary | ICD-10-CM | POA: Insufficient documentation

## 2021-01-08 DIAGNOSIS — L97522 Non-pressure chronic ulcer of other part of left foot with fat layer exposed: Secondary | ICD-10-CM | POA: Insufficient documentation

## 2021-01-08 DIAGNOSIS — E11621 Type 2 diabetes mellitus with foot ulcer: Secondary | ICD-10-CM | POA: Diagnosis not present

## 2021-01-09 ENCOUNTER — Other Ambulatory Visit (HOSPITAL_COMMUNITY): Payer: Self-pay | Admitting: Physician Assistant

## 2021-01-09 DIAGNOSIS — E13621 Other specified diabetes mellitus with foot ulcer: Secondary | ICD-10-CM

## 2021-01-09 DIAGNOSIS — L97529 Non-pressure chronic ulcer of other part of left foot with unspecified severity: Secondary | ICD-10-CM

## 2021-01-09 NOTE — Progress Notes (Signed)
MAQUITA, [name] [name] ([id number]) Visit Report for 01/08/2021 Chief Complaint Document Details Patient Name: Date of Service: [name] [name] [name], [name] [name] [name] [name] [name] E. 01/08/2021 1:45 PM Medical Record Number: [id number] Patient Account Number: [id number] Date of Birth/Sex: Treating RN: 09-24-1945 (75 y.o. [name] Other Clinician: Referring Provider: Treating Provider/Extender: Landis Martins Weeks in Treatment: 0 Information Obtained from: Patient Chief Complaint Left foot ulcer Electronic Signature(s) Signed: 01/08/2021 2:44:17 PM By: Worthy Keeler PA-C Entered By: Worthy Keeler on 01/08/2021 14:44:17 -------------------------------------------------------------------------------- Debridement Details Patient Name: Date of Service: [name] [name] [name] Amato [name] [name] [name] E. 01/08/2021 1:45 PM Medical Record Number: [id number] Patient Account Number: [id number] Date of Birth/Sex: Treating RN: 1946-01-01 (75 y.o. [name] Other Clinician: Referring Provider: Treating Provider/Extender: Landis Martins Weeks in Treatment: 0 Debridement Performed for Assessment: Wound #[id number] Left,Medial,Plantar Foot Performed By: Physician Worthy Keeler, PA Debridement Type: Debridement Severity of Tissue Pre Debridement: Fat layer exposed Level of Consciousness (Pre-procedure): Awake and Alert Pre-procedure Verification/Time Out Yes - 14:45 Taken: Start Time: 14:48 T Area Debrided (L x W): otal 1.5 (cm) x 1.5 (cm) = 2.25 (cm) Tissue and other material debrided: Non-Viable, Callus, Skin: Epidermis Level: Skin/Epidermis Debridement Description: Selective/Open Wound Instrument: Curette Bleeding: Minimum Hemostasis Achieved: Pressure End Time: 14:55 Procedural Pain: 0 Post Procedural Pain: 0 Response to Treatment: Procedure was tolerated well Level of Consciousness (Post- Awake and Alert procedure): Post Debridement  Measurements of Total Wound Length: (cm) 0.3 Width: (cm) 0.4 Depth: (cm) 0.2 Volume: (cm) 0.019 Character of Wound/Ulcer Post Debridement: Improved Severity of Tissue Post Debridement: Fat layer exposed Post Procedure Diagnosis Same as Pre-procedure Electronic Signature(s) Signed: 01/08/2021 5:35:00 PM By: Worthy Keeler PA-C Signed: 01/08/2021 6:06:42 PM By: [name] RN, BSN Entered By: [name] on 01/08/2021 14:56:26 -------------------------------------------------------------------------------- HPI Details Patient Name: Date of Service: [name] [name], [name] [name] [name] [name] [name] [name] [name] [name] [name] E. 01/08/2021 1:45 PM Medical Record Number: [id number] Patient Account Number: [id number] Date of Birth/Sex: Treating RN: 1945/12/21 (75 y.o. [name] Other Clinician: Referring Provider: Treating Provider/Extender: Adele Schilder in Treatment: 0 History of Present Illness HPI Description: 01/17/2020 upon evaluation today patient presents for initial evaluation here in our clinic concerning issues she has been having with a left medial/plantar foot ulcer. This is actually been an issue for her since October 2020. She has been seeing Dr. Doran Durand for quite some time during that course. Fortunately there is no signs of active infection at this time. Or least no mention of this to have seen in general. With that being said unfortunately I do see some signs of erythema noted today that does have me concerned about the possibility of infection at this point in the surrounding area of the wound. There is also a warm to touch at the site which is somewhat concerning. Fortunately there is no evidence of systemic infection which is great news. The patient does have a history of diabetes mellitus type 2, Charcot foot which is what led to the wound, and hypertension. She notes that she was in a cast for some time with Dr. Doran Durand for about 8 weeks. During that time they were  utilizing according to the patient silver nitrate along with a foam doughnut and then Coban to secure in place in the cast in place. With that being said I do not have the actual records to review we are going to try to get  a hold of those unfortunately they would not flow over into care everywhere I did look today. She has been seeing Dr. Doran Durand and his physician assistant Larkin Ina up until the end of May and apparently is still seeing them on a regular basis every 2 weeks roughly. She has also tried Iodosorb without effect here. 01/24/2020 upon evaluation today patient actually appears to be doing quite well with regard to her wounds. She has been tolerating the dressing changes without complication. Fortunately there is no signs of active infection spreading which is good news. Her culture did show signs of Staph aureus I did place her on Augmentin due to the erythema surrounding the wound. With that being said the wound does appear to be doing better she has her longer walking cast/boot and I think that is actually good for her for the time being. I am considering reinitiating total contact cast when she gets back from vacation but next week she will actually be out of town at [employer] she knows not to get in the water but she still obviously is planning to enjoy herself she is going to take it easy on her foot however. 02/07/2020 upon evaluation today patient appears to be doing fairly well in regard to her ulcer on her foot. Fortunately there is no signs of severe infection at this time which is great news and overall very pleased in that regard. With that being said I do think that she could still benefit from a total contact cast. Nonetheless she is using her walking boot which at least provide some protection and that it prevents some of the friction occurring when she is ambulating. 02/14/2020 upon evaluation today patient appears to be doing well with regard to her foot ulcer. This is actually  measuring a little bit smaller yet again this week. Overall very pleased with where things stand and I do not see any signs of active infection at this time which is also good news. Since she is measuring better the patient has wanting to somewhat hold off on proceeding with the total contact cast which I think is reasonable at this point. 02/28/2020 on evaluation today patient appears to be doing well in general in regard to her wound although she has a lot of callus buildup as compared to last time I saw her. This is can require sharp debridement today. I do believe she really needs the total contact cast as well which we have discussed previous. 7/23; patient comes in for a total contact cast change 03/06/2020 on evaluation today patient appears to be doing quite well with regard to her wounds. Fortunately the wound bed is measuring smaller and looking much better there is little callus noted although there is some debridement necessary today. 03/13/2020 on evaluation today patient's wound actually appears to be doing excellent which is great news. With that being said unfortunately she is having some issues currently with her left leg where she does have cellulitis it appears. This may have come from an area that rubbed underneath the cast from last week that we noted we padded that area and it looks to be doing excellent at this point but nonetheless the leg was somewhat painful, swollen, and somewhat erythematous. She also had an elevated white blood cell count of 11.5 based on what I saw on looking at her records from the med center in [facility] from where she was seen yesterday. Unfortunately with [place] having a provider on vacation there was no one  here in the clinic in the afternoon when she called therefore she went to the ER as advised. Subsequently they did not cut off the cast as they did not have anyone from orthopedics there to do so and subsequently also did not have the ability to do the  Doppler for evaluation of DVT They recommended therefore given her dose of Eliquis as well as . Augmentin and sent her home to come see [place] today to have the cast taken off and then she is supposed to go back to have the study for DVT performed they are following. 03/20/2020 upon evaluation today patient appears to be doing well with regard to her foot all things considered we have not been able to use the total contact cast due to the infection that she had last week. She has been on the doxycycline and she had a 10-day supply of that I do believe that is helping and her leg appears to be doing better. With that being said there is fortunately no signs of active infection systemically at this time which is good news. No fevers, chills, nausea, vomiting, or diarrhea. 03/27/2020 upon evaluation today patient appears to be doing well with regard to her foot ulcer. There does not appear to be signs of active infection which is great news. Overall I am very pleased with where things stand at this point. 04/03/2020 upon evaluation today patient appears to be doing pretty well in regard to the overall appearance of her wound. Fortunately there is no signs of active infection at this time which is great news. No fevers, chills, nausea, vomiting, or diarrhea. With that being said she does have some blue-green drainage that actually is a little bit concerning to me for the possibility of Pseudomonas. I discussed that with the patient today. With that being said I do believe that we may be able to manage this however with the topical antibiotic cream as opposed to having to do anything oral especially since she seems to be doing so well with overall appearance of the wound. 04/10/2020 on evaluation today patient appears to be doing about the same roughly in regard to the overall size of her wound. With that being said she fortunately has not shown any signs of worsening overall which is good news. I do believe that  she is doing a great job trying to offload but again she may still do better with the cast. I do not see in the blue-green drainage that we noticed previously I do believe the gentamicin help in this regard. 04/17/2020 on evaluation today patient's wound appears to be doing about the same at this point. There is no significant improvement at this point. No fever chills noted. She is up for put the cast back on the day. That she states in a couple weeks she will need to have this off to go to a workshop. 04/24/2020 on evaluation today patient appears to be doing significantly better in regard to her wound. Fortunately there is no signs of active infection and overall feel like she is making great progress the cast seems to have done excellent for her. 05/01/2020 upon evaluation today patient presents for reevaluation she really does not appear to be doing too badly in regard to the actual wound on the left foot we have been managing. Unfortunately she has bilateral lower extremity edema with blisters between the webspace of her first and second toe on both feet. She has a tremendous amount of edema in the  legs which I think is where this is coming from it does not appear to be infected but nonetheless I do believe this is can be something that needs to be addressed today. Obviously this means we probably will not be putting the cast on at this point. She attributes this to the fact that she was sitting with her feet on the floor much longer during a conference last week she had a great time but unfortunately had a lot of complications as a result. 05/08/2020 upon evaluation today patient appears to be doing somewhat better in regard to her wounds at this time. Fortunately there is no signs of active infection which is great news. With that being said I do believe that the blisters have ruptured and unfortunately did not just reattach I will remove some of the blistered tissue today. With that being said I do  think the wound itself on the plantar aspect of left foot does need to have sharp debridement. 05/15/2020 upon evaluation today patient appears to be doing about the same in regard to her foot ulcer. Unfortunately in the past week her husband had a fall where he sustained a mild traumatic brain bleed. Fortunately he is doing better but being that he was in the hospital she had a walk on this a lot more. The wound does not appear to be any better is also not really appearing to be significantly worse which is good news. There is no signs of active infection at this time. 10/14; patient with a small diabetic wound on the medial part of her left foot. We have been using silver collagen a total contact cast making good progress. I think the patient had a series of blisters on her dorsal foot probably secondary to having her legs recumbent for 3 days while in a conference in [place]. We wrapped her leg last week these are all healed. We did not previously have her in compression on the right leg. 05/29/2020 upon evaluation today patient appears to be doing well with regard to the wound on the plantar aspect of her foot medially. This is measuring smaller and looking much better than last time I saw her. Again when I did see her last was 2 weeks back and the wound was significantly larger. I do believe the cast is helping and I believe the collagen is a good option for her. 06/05/2020 on evaluation today patient appears to be doing well with regard to her foot ulcer this is actually measuring significantly better and overall I feel like she is doing excellent. There is no signs of active infection at this time. 06/12/2020 upon evaluation today patient actually continues to show signs of good improvement which is excellent news. I am extremely pleased with how she seems to be progressing at this point in regard to her wound. There is still some depth to the wound but I do believe the collagen is helping her  quite a bit. 06/19/2020 upon evaluation today patient appears to be doing well with regard to her plantar foot ulcer. She is actually making excellent progress and in fact this appears to be almost completely healed. With that being said I do believe that the patient is going to actually require 1 more week in the cast although after that I am hopeful she will be ready for discharge. 06/26/2020 on evaluation today patient appears to be doing well in regard to her wound currently. Fortunately there is no signs of active infection in general I  feel like she is doing excellent. This appears to be completely healed I think she is ready to come out of the cast. 11/29; patient comes back in the clinic today with a very quick reopening in the exact same area on the medial plantar foot. She had been healed out last time. She went back into some new balance shoes that she got at [employer] with a custom insert. As it turns out this wound also happened when wearing these shoes although there was some modification made I think with the wound initially happened they added some foam around the wound area. This obviously is not going to be sufficient. 07/17/2020 on evaluation today patient appears to be doing well with regard to her wound. Fortunately she seems to be making good progress. Unfortunately she was in the hospital due to a issue with colitis and had just been discharged today in fact. She tells me that her biggest concern here is that a lot of her numbers especially her creatinine were somewhat elevated and problematic. She is can be following up with her provider in order to have a further work-up at this time. With that being said she did need to come back in for a cast change. She did not allow them to remove the cast due to the fact that she did not feel like that was the issue whatsoever and indeed it does not appear that was the case her wound appears to be doing excellent today. 07/24/2020 upon  evaluation today patient appears to be doing well in regard to her foot ulcer. She still has a small opening but this is showing signs of excellent improvement overall but they were very close to complete resolution. No fevers, chills, nausea, vomiting, or diarrhea. 07/31/2020 upon evaluation today patient actually appears to be doing excellent she is actually completely healed this is great news. Fortunately there is no signs of active infection at this time. No fevers, chills, nausea, vomiting, or diarrhea. The patient tells me that she did see Dr. Paulla Dolly in order to get to [place] so that she can have a custom shoe made. She saw him earlier last week. She does have an appointment with Liliane Channel on the sixth I believe on January she tells me Readmission: 08/28/2020 on evaluation today patient appears to be doing poorly in regard to her wound currently. She tells me this has reopened. The good news that she did get measured for and actually her shoes are on the way in from [facility].. With that being said she has been trying to stay off of this is much as possible using her wheelchair around home. Nonetheless has been somewhat difficult. The good news is her shoes should be here next week 09/04/2020 upon evaluation today patient appears to be doing well with regard to her wound. There is a little bit of callus buildup but nothing too significant she does tell me she was very active this week. Fortunately there is no signs of systemic infection at this point. The dorsal foot wound actually appears to be doing better. I think this is healed. 09/11/2020 upon evaluation today patient appears to actually be doing quite well in my opinion based on what I am seeing today. Fortunately there is no signs of infection in fact her mother is certain that this is even open any longer based on what I see. Fortunately I think the patient has been doing everything she can to try to keep this under control. 09/18/2020 upon  evaluation today patient appears to be doing about the same in regard to her foot ulcer. She has been tolerating the dressing changes without complication. Fortunately there is no sign of active infection at this time. No fevers, chills, nausea, vomiting, or diarrhea. She is going require some sharp debridement today. 09/25/2020 upon evaluation today patient appears to be doing well with regard to his wounds she has been tolerating the dressing changes without complication. Her wound appears to be completely healed based on what I am seeing at this point. There does not appear to be any signs of active infection at this time which is great news. No fevers, chills, nausea, vomiting, or diarrhea. She did get the cushion for her foot as well that she ordered to try to help keep pressure off of this area. That looks like it may be very beneficial for her to be honest. 10/02/2020 upon evaluation today patient appears to be doing more poorly in regard to her foot ulcer. Unfortunately this has reopened since we saw her last week. It apparently did not take too long at all for this to happen. She is obviously somewhat disappointed as she was hopeful that that would be time this thing would stay closed. Fortunately there is no evidence of active infection at this point. No fevers, chills, nausea, vomiting, or diarrhea. With that being said I am good have to perform a little bit of sharp debridement clear away some of the debris currently. Including a minimal amount of callus at this time. 10/16/2020 upon evaluation today patient appears to be doing about the same in regard to her foot ulcer unfortunately. There does not appear to be any signs of active infection which is great news. No fever chills noted she has been tolerating the dressing changes without complication which is great news. Unfortunately she tells me that she has been very depressed about the situation is getting very frustrating to her that she  continues to have issues despite everything that she is trying to do to stay off of her foot. She is extremely discouraged and I do hate to hear this. Obviously we have been hopeful that if she got her shoes that would make a difference she is actually can be getting those tomorrow but I am not certain that that alone is good to be enough to get this to heal. We may end up having to consider going back into a total contact cast to get this to heal and then subsequently once we get her healed get her into her new diabetic shoes which will and my hope anyway keep this from reopening again. 10/23/2020 upon evaluation today patient appears to be doing a little worse both in regard to the size of her wound as well as in regard to the fact that she has erythema surrounding the wound and wrap around the lateral part of her foot where she tells me has been somewhat sore. Fortunately there does not appear to be any evidence of infection systemically but locally definitely there is erythema and warmth consistent with local cellulitis. I am glad to remove some of the callus as well. 10/30/2020 upon evaluation today patient appears to be doing well with regard to her wound on the plantar aspect of her foot. This is significantly improved compared to last week. 10/30/2020 upon evaluation today patient actually is making excellent progress her wound appears to show signs of great improvement which is wonderful and that she is extremely pleased to hear this. She  actually leaves Sunday to go on her trip with her half sister and friends. 11/13/2020 on evaluation today patient's wound actually appears to be doing quite well. There does not appear to be any signs of infection it has been 2 weeks since have seen her she does have a little bit of callus buildup here today but at the same time I do not believe the vacation time set her back any just has not really made a lot of significant improvement. She is done with her  antibiotics at this point. 11/20/2020 upon evaluation today patient appears to be doing poorly in regard to her foot. In fact this appears to be showing signs of Infection. She has erythema and warmth that is concerning. I know she is very discouraged as this seems to be a recurrent issue. I think we may need to delve further into the possibility of something deeper going on here as far as a structural infection. 11/27/2020 patient presents for 1 week follow-up. She had a culture of her left foot ulcer that grew staph aureus sensitive to Augmentin. This was called in by [place], [place] this morning. She is currently on doxycycline for previous culture result. Patient also had an x-ray done of her left foot that had conflicting results. We asked for clarification and was told we would have clarification on Monday. Patient states overall she is doing well. She tries to stay off of her foot is much as possible. 12/04/2020 patient presents for 1 week follow-up. She is currently taking Augmentin and tolerating this well. It was confirmed that her x-ray had no acute osseous abnormalities. We switched her to collagen from silver alginate last week. Patient is overall doing well and trying to stay off her foot is much as possible. She has no complaints today. 12/11/2020 upon evaluation today patient appears to be doing well with regard to her wound. She has been tolerating the dressing changes without complication. Fortunately there is no signs of active infection noted at this point. I think she is definitely ready to go back into the cast. 12/18/2020 upon evaluation today patient appears to be doing well with regard to her wound. She has been tolerating the dressing changes without complication. Fortunately there is no signs of active infection at this time. No fevers, chills, nausea, vomiting, or diarrhea. 12/25/2020 upon evaluation today patient appears to be doing well in general in regard to her plantar foot ulcer.  With that being said she tells me currently that there does not appear to be any signs of pain or problems which is great in fact the wound appears to be almost completely closed which is also excellent news. I do not see any signs of infection which is great news as well. No fevers, chills, nausea, vomiting, or diarrhea. 5/25; patient presents for 1 week follow-up. She has had the cast in place for the past week and has tolerated this well. She states she is getting orthotics on 5/10. She currently denies signs of infection and has no issues today. 01/08/2021 upon evaluation today patient appears unfortunately to be present today with issues that she has been having with her plantar foot wound. This apparently has reoccurred unfortunately and is causing quite a bit of discomfort and problems for her. There does not appear to be any signs of active infection which is great news and overall I am very pleased with where things stand in that regard but nonetheless I think she is definitely worried about where this can  lead due to past history. She was just taken out of the cast and marked is healed last week. Electronic Signature(s) Signed: 01/08/2021 3:23:30 PM By: Worthy Keeler PA-C Entered By: Worthy Keeler on 01/08/2021 15:23:30 -------------------------------------------------------------------------------- Physical Exam Details Patient Name: Date of Service: RAYLINN, [name] [name] [name] [name] [name] [name] [name] [name] E. 01/08/2021 1:45 PM Medical Record Number: [id number] Patient Account Number: [id number] Date of Birth/Sex: Treating RN: [birth year] (75 y.o. [name] Other Clinician: Referring Provider: Treating Provider/Extender: Landis Martins Weeks in Treatment: 0 Constitutional Well-nourished and well-hydrated in no acute distress. Respiratory normal breathing without difficulty. Psychiatric this patient is able to make decisions and demonstrates good insight into  disease process. Alert and Oriented x 3. pleasant and cooperative. Notes Upon inspection patient's wound bed actually showed signs of good granulation epithelization at this point. There does not appear to be any signs of active infection which is great. She does have however some callus buildup I did remove the callus and subsequently identified an underlying area here that is a more significant issue to be honest. She still has an open wound and despite the fact that she seemed to be completely healed after the cast was removed last week this has now reoccurred. Obviously that is not what we were looking for in the least. I think she may require a MRI in order to evaluate and see if there is anything underlying that we are missing here. Electronic Signature(s) Signed: 01/08/2021 3:24:28 PM By: Worthy Keeler PA-C Entered By: Worthy Keeler on 01/08/2021 15:24:28 -------------------------------------------------------------------------------- Physician Orders Details Patient Name: Date of Service: [name] [name], [name] [name] [name] [name] [name] [name] [name] [name] [name] E. 01/08/2021 1:45 PM Medical Record Number: [id number] Patient Account Number: [id number] Date of Birth/Sex: Treating RN: 1945/08/27 (75 y.o. [name] Other Clinician: Referring Provider: Treating Provider/Extender: Adele Schilder in Treatment: 0 Verbal / Phone Orders: No Diagnosis Coding Follow-up Appointments ppointment in 1 week. - allow extra time for cast Return A Bathing/ Shower/ Hygiene May shower and wash wound with soap and water. Off-Loading Total Contact Cast to Left Lower Extremity Additional Orders / Instructions Follow Nutritious Diet Wound Treatment Wound #[id number] - Foot Wound Laterality: Plantar, Left, Medial Prim Dressing: KerraCel Ag Gelling Fiber Dressing, 2x2 in (silver alginate) 1 x Per Week/7 Days ary Discharge Instructions: Apply silver alginate to wound bed as instructed Secondary  Dressing: Woven Gauze Sponges 2x2 in 1 x Per Week/7 Days Discharge Instructions: Apply over primary dressing as directed. Secondary Dressing: Optifoam Non-Adhesive Dressing, 4x4 in 1 x Per Week/7 Days Discharge Instructions: Apply over primary dressing cut to form foam donut Secured With: 41M Medipore H Soft Cloth Surgical T 4 x 2 (in/yd) 1 x Per Week/7 Days ape Discharge Instructions: Secure dressing with tape as directed. Radiology MRI, lower extremity with/without contrast left foot - diabetic foot ulcer left plantar foot, r/o osteomyelitis Electronic Signature(s) Signed: 01/08/2021 5:35:00 PM By: Worthy Keeler PA-C Signed: 01/08/2021 6:06:42 PM By: [name] RN, BSN Entered By: [name] on 01/08/2021 15:21:12 Prescription 01/08/2021 -------------------------------------------------------------------------------- Darral Dash PA Patient Name: Provider: 01-06-1946 [id number] Date of Birth: NPI#Wanda Plump [id number] Sex: DEA #: [phone] Phone #: License #: [place] Patient Address: [place], [place] [address] [address], [place] [address] [phone] Allergies bee venom protein (honey bee); Sulfa (Sulfonamide Antibiotics); Farxiga Provider's Orders MRI, lower extremity  with/without contrast left foot - diabetic foot ulcer left plantar foot, r/o osteomyelitis Hand Signature: Date(s): Electronic Signature(s) Signed: 01/08/2021 5:35:00 PM By: Worthy Keeler PA-C Signed: 01/08/2021 6:06:42 PM By: [name] RN, BSN Entered By: [name] on 01/08/2021 15:21:13 -------------------------------------------------------------------------------- Problem List Details Patient Name: Date of Service: [name] [name] [name] Amato [name] [name] [name] E. 01/08/2021 1:45 PM Medical Record Number: [id number] Patient Account Number: [id number] Date of Birth/Sex: Treating RN: 01-Jun-1946 (75 y.o. [name] Other Clinician: Referring Provider: Treating Provider/Extender: Landis Martins Weeks in Treatment: 0 Active Problems ICD-10 Encounter Code Description Active Date MDM Diagnosis E11.621 Type 2 diabetes mellitus with foot ulcer 01/08/2021 No Yes L97.522 Non-pressure chronic ulcer of other part of left foot with fat layer exposed 01/08/2021 No Yes M14.672 Charcot's joint, left ankle and foot 01/08/2021 No Yes E11.40 Type 2 diabetes mellitus with diabetic neuropathy, unspecified 01/08/2021 No Yes Inactive Problems Resolved Problems Electronic Signature(s) Signed: 01/08/2021 3:26:26 PM By: Worthy Keeler PA-C Previous Signature: 01/08/2021 2:44:08 PM Version By: Worthy Keeler PA-C Entered By: Worthy Keeler on 01/08/2021 15:26:26 -------------------------------------------------------------------------------- Progress Note Details Patient Name: Date of Service: [name] [name], [name] [name] [name] [name] [name] [name] [name] [name] [name] E. 01/08/2021 1:45 PM Medical Record Number: [id number] Patient Account Number: [id number] Date of Birth/Sex: Treating RN: [birth year] (75 y.o. [name] Other Clinician: Referring Provider: Treating Provider/Extender: Landis Martins Weeks in Treatment: 0 Subjective Chief Complaint Information obtained from Patient Left foot ulcer History of Present Illness (HPI) 01/17/2020 upon evaluation today patient presents for initial evaluation here in our clinic concerning issues she has been having with a left medial/plantar foot ulcer. This is actually been an issue for her since October 2020. She has been seeing Dr. Doran Durand for quite some time during that course. Fortunately there is no signs of active infection at this time. Or least no mention of this to have seen in general. With that being said unfortunately I do see some signs of erythema noted today that does have me concerned about the possibility of  infection at this point in the surrounding area of the wound. There is also a warm to touch at the site which is somewhat concerning. Fortunately there is no evidence of systemic infection which is great news. The patient does have a history of diabetes mellitus type 2, Charcot foot which is what led to the wound, and hypertension. She notes that she was in a cast for some time with Dr. Doran Durand for about 8 weeks. During that time they were utilizing according to the patient silver nitrate along with a foam doughnut and then Coban to secure in place in the cast in place. With that being said I do not have the actual records to review we are going to try to get a hold of those unfortunately they would not flow over into care everywhere I did look today. She has been seeing Dr. Doran Durand and his physician assistant Larkin Ina up until the end of May and apparently is still seeing them on a regular basis every 2 weeks roughly. She has also tried Iodosorb without effect here. 01/24/2020 upon evaluation today patient actually appears to be doing quite well with regard to her wounds. She has been tolerating the dressing changes without complication. Fortunately there is no signs of active infection spreading which is good news. Her culture did show signs of Staph aureus I did place her on Augmentin due to the erythema surrounding  the wound. With that being said the wound does appear to be doing better she has her longer walking cast/boot and I think that is actually good for her for the time being. I am considering reinitiating total contact cast when she gets back from vacation but next week she will actually be out of town at [employer] she knows not to get in the water but she still obviously is planning to enjoy herself she is going to take it easy on her foot however. 02/07/2020 upon evaluation today patient appears to be doing fairly well in regard to her ulcer on her foot. Fortunately there is no signs of severe  infection at this time which is great news and overall very pleased in that regard. With that being said I do think that she could still benefit from a total contact cast. Nonetheless she is using her walking boot which at least provide some protection and that it prevents some of the friction occurring when she is ambulating. 02/14/2020 upon evaluation today patient appears to be doing well with regard to her foot ulcer. This is actually measuring a little bit smaller yet again this week. Overall very pleased with where things stand and I do not see any signs of active infection at this time which is also good news. Since she is measuring better the patient has wanting to somewhat hold off on proceeding with the total contact cast which I think is reasonable at this point. 02/28/2020 on evaluation today patient appears to be doing well in general in regard to her wound although she has a lot of callus buildup as compared to last time I saw her. This is can require sharp debridement today. I do believe she really needs the total contact cast as well which we have discussed previous. 7/23; patient comes in for a total contact cast change 03/06/2020 on evaluation today patient appears to be doing quite well with regard to her wounds. Fortunately the wound bed is measuring smaller and looking much better there is little callus noted although there is some debridement necessary today. 03/13/2020 on evaluation today patient's wound actually appears to be doing excellent which is great news. With that being said unfortunately she is having some issues currently with her left leg where she does have cellulitis it appears. This may have come from an area that rubbed underneath the cast from last week that we noted we padded that area and it looks to be doing excellent at this point but nonetheless the leg was somewhat painful, swollen, and somewhat erythematous. She also had an elevated white blood cell count of  11.5 based on what I saw on looking at her records from the med center in [facility] from where she was seen yesterday. Unfortunately with [place] having a provider on vacation there was no one here in the clinic in the afternoon when she called therefore she went to the ER as advised. Subsequently they did not cut off the cast as they did not have anyone from orthopedics there to do so and subsequently also did not have the ability to do the Doppler for evaluation of DVT They recommended therefore given her dose of Eliquis as well as . Augmentin and sent her home to come see [place] today to have the cast taken off and then she is supposed to go back to have the study for DVT performed they are following. 03/20/2020 upon evaluation today patient appears to be doing well with regard to  her foot all things considered we have not been able to use the total contact cast due to the infection that she had last week. She has been on the doxycycline and she had a 10-day supply of that I do believe that is helping and her leg appears to be doing better. With that being said there is fortunately no signs of active infection systemically at this time which is good news. No fevers, chills, nausea, vomiting, or diarrhea. 03/27/2020 upon evaluation today patient appears to be doing well with regard to her foot ulcer. There does not appear to be signs of active infection which is great news. Overall I am very pleased with where things stand at this point. 04/03/2020 upon evaluation today patient appears to be doing pretty well in regard to the overall appearance of her wound. Fortunately there is no signs of active infection at this time which is great news. No fevers, chills, nausea, vomiting, or diarrhea. With that being said she does have some blue-green drainage that actually is a little bit concerning to me for the possibility of Pseudomonas. I discussed that with the patient today. With that being said I do believe that  we may be able to manage this however with the topical antibiotic cream as opposed to having to do anything oral especially since she seems to be doing so well with overall appearance of the wound. 04/10/2020 on evaluation today patient appears to be doing about the same roughly in regard to the overall size of her wound. With that being said she fortunately has not shown any signs of worsening overall which is good news. I do believe that she is doing a great job trying to offload but again she may still do better with the cast. I do not see in the blue-green drainage that we noticed previously I do believe the gentamicin help in this regard. 04/17/2020 on evaluation today patient's wound appears to be doing about the same at this point. There is no significant improvement at this point. No fever chills noted. She is up for put the cast back on the day. That she states in a couple weeks she will need to have this off to go to a workshop. 04/24/2020 on evaluation today patient appears to be doing significantly better in regard to her wound. Fortunately there is no signs of active infection and overall feel like she is making great progress the cast seems to have done excellent for her. 05/01/2020 upon evaluation today patient presents for reevaluation she really does not appear to be doing too badly in regard to the actual wound on the left foot we have been managing. Unfortunately she has bilateral lower extremity edema with blisters between the webspace of her first and second toe on both feet. She has a tremendous amount of edema in the legs which I think is where this is coming from it does not appear to be infected but nonetheless I do believe this is can be something that needs to be addressed today. Obviously this means we probably will not be putting the cast on at this point. She attributes this to the fact that she was sitting with her feet on the floor much longer during a conference last week she  had a great time but unfortunately had a lot of complications as a result. 05/08/2020 upon evaluation today patient appears to be doing somewhat better in regard to her wounds at this time. Fortunately there is no signs of active infection  which is great news. With that being said I do believe that the blisters have ruptured and unfortunately did not just reattach I will remove some of the blistered tissue today. With that being said I do think the wound itself on the plantar aspect of left foot does need to have sharp debridement. 05/15/2020 upon evaluation today patient appears to be doing about the same in regard to her foot ulcer. Unfortunately in the past week her husband had a fall where he sustained a mild traumatic brain bleed. Fortunately he is doing better but being that he was in the hospital she had a walk on this a lot more. The wound does not appear to be any better is also not really appearing to be significantly worse which is good news. There is no signs of active infection at this time. 10/14; patient with a small diabetic wound on the medial part of her left foot. We have been using silver collagen a total contact cast making good progress. I think the patient had a series of blisters on her dorsal foot probably secondary to having her legs recumbent for 3 days while in a conference in [place]. We wrapped her leg last week these are all healed. We did not previously have her in compression on the right leg. 05/29/2020 upon evaluation today patient appears to be doing well with regard to the wound on the plantar aspect of her foot medially. This is measuring smaller and looking much better than last time I saw her. Again when I did see her last was 2 weeks back and the wound was significantly larger. I do believe the cast is helping and I believe the collagen is a good option for her. 06/05/2020 on evaluation today patient appears to be doing well with regard to her foot ulcer this is  actually measuring significantly better and overall I feel like she is doing excellent. There is no signs of active infection at this time. 06/12/2020 upon evaluation today patient actually continues to show signs of good improvement which is excellent news. I am extremely pleased with how she seems to be progressing at this point in regard to her wound. There is still some depth to the wound but I do believe the collagen is helping her quite a bit. 06/19/2020 upon evaluation today patient appears to be doing well with regard to her plantar foot ulcer. She is actually making excellent progress and in fact this appears to be almost completely healed. With that being said I do believe that the patient is going to actually require 1 more week in the cast although after that I am hopeful she will be ready for discharge. 06/26/2020 on evaluation today patient appears to be doing well in regard to her wound currently. Fortunately there is no signs of active infection in general I feel like she is doing excellent. This appears to be completely healed I think she is ready to come out of the cast. 11/29; patient comes back in the clinic today with a very quick reopening in the exact same area on the medial plantar foot. She had been healed out last time. She went back into some new balance shoes that she got at [employer] with a custom insert. As it turns out this wound also happened when wearing these shoes although there was some modification made I think with the wound initially happened they added some foam around the wound area. This obviously is not going to be sufficient. 07/17/2020 on evaluation  today patient appears to be doing well with regard to her wound. Fortunately she seems to be making good progress. Unfortunately she was in the hospital due to a issue with colitis and had just been discharged today in fact. She tells me that her biggest concern here is that a lot of her numbers especially her  creatinine were somewhat elevated and problematic. She is can be following up with her provider in order to have a further work-up at this time. With that being said she did need to come back in for a cast change. She did not allow them to remove the cast due to the fact that she did not feel like that was the issue whatsoever and indeed it does not appear that was the case her wound appears to be doing excellent today. 07/24/2020 upon evaluation today patient appears to be doing well in regard to her foot ulcer. She still has a small opening but this is showing signs of excellent improvement overall but they were very close to complete resolution. No fevers, chills, nausea, vomiting, or diarrhea. 07/31/2020 upon evaluation today patient actually appears to be doing excellent she is actually completely healed this is great news. Fortunately there is no signs of active infection at this time. No fevers, chills, nausea, vomiting, or diarrhea. The patient tells me that she did see Dr. Paulla Dolly in order to get to [place] so that she can have a custom shoe made. She saw him earlier last week. She does have an appointment with Liliane Channel on the sixth I believe on January she tells me Readmission: 08/28/2020 on evaluation today patient appears to be doing poorly in regard to her wound currently. She tells me this has reopened. The good news that she did get measured for and actually her shoes are on the way in from [facility].. With that being said she has been trying to stay off of this is much as possible using her wheelchair around home. Nonetheless has been somewhat difficult. The good news is her shoes should be here next week 09/04/2020 upon evaluation today patient appears to be doing well with regard to her wound. There is a little bit of callus buildup but nothing too significant she does tell me she was very active this week. Fortunately there is no signs of systemic infection at this point. The dorsal foot  wound actually appears to be doing better. I think this is healed. 09/11/2020 upon evaluation today patient appears to actually be doing quite well in my opinion based on what I am seeing today. Fortunately there is no signs of infection in fact her mother is certain that this is even open any longer based on what I see. Fortunately I think the patient has been doing everything she can to try to keep this under control. 09/18/2020 upon evaluation today patient appears to be doing about the same in regard to her foot ulcer. She has been tolerating the dressing changes without complication. Fortunately there is no sign of active infection at this time. No fevers, chills, nausea, vomiting, or diarrhea. She is going require some sharp debridement today. 09/25/2020 upon evaluation today patient appears to be doing well with regard to his wounds she has been tolerating the dressing changes without complication. Her wound appears to be completely healed based on what I am seeing at this point. There does not appear to be any signs of active infection at this time which is great news. No fevers, chills, nausea, vomiting,  or diarrhea. She did get the cushion for her foot as well that she ordered to try to help keep pressure off of this area. That looks like it may be very beneficial for her to be honest. 10/02/2020 upon evaluation today patient appears to be doing more poorly in regard to her foot ulcer. Unfortunately this has reopened since we saw her last week. It apparently did not take too long at all for this to happen. She is obviously somewhat disappointed as she was hopeful that that would be time this thing would stay closed. Fortunately there is no evidence of active infection at this point. No fevers, chills, nausea, vomiting, or diarrhea. With that being said I am good have to perform a little bit of sharp debridement clear away some of the debris currently. Including a minimal amount of callus at this  time. 10/16/2020 upon evaluation today patient appears to be doing about the same in regard to her foot ulcer unfortunately. There does not appear to be any signs of active infection which is great news. No fever chills noted she has been tolerating the dressing changes without complication which is great news. Unfortunately she tells me that she has been very depressed about the situation is getting very frustrating to her that she continues to have issues despite everything that she is trying to do to stay off of her foot. She is extremely discouraged and I do hate to hear this. Obviously we have been hopeful that if she got her shoes that would make a difference she is actually can be getting those tomorrow but I am not certain that that alone is good to be enough to get this to heal. We may end up having to consider going back into a total contact cast to get this to heal and then subsequently once we get her healed get her into her new diabetic shoes which will and my hope anyway keep this from reopening again. 10/23/2020 upon evaluation today patient appears to be doing a little worse both in regard to the size of her wound as well as in regard to the fact that she has erythema surrounding the wound and wrap around the lateral part of her foot where she tells me has been somewhat sore. Fortunately there does not appear to be any evidence of infection systemically but locally definitely there is erythema and warmth consistent with local cellulitis. I am glad to remove some of the callus as well. 10/30/2020 upon evaluation today patient appears to be doing well with regard to her wound on the plantar aspect of her foot. This is significantly improved compared to last week. 10/30/2020 upon evaluation today patient actually is making excellent progress her wound appears to show signs of great improvement which is wonderful and that she is extremely pleased to hear this. She actually leaves Sunday to go  on her trip with her half sister and friends. 11/13/2020 on evaluation today patient's wound actually appears to be doing quite well. There does not appear to be any signs of infection it has been 2 weeks since have seen her she does have a little bit of callus buildup here today but at the same time I do not believe the vacation time set her back any just has not really made a lot of significant improvement. She is done with her antibiotics at this point. 11/20/2020 upon evaluation today patient appears to be doing poorly in regard to her foot. In fact this appears to be  showing signs of Infection. She has erythema and warmth that is concerning. I know she is very discouraged as this seems to be a recurrent issue. I think we may need to delve further into the possibility of something deeper going on here as far as a structural infection. 11/27/2020 patient presents for 1 week follow-up. She had a culture of her left foot ulcer that grew staph aureus sensitive to Augmentin. This was called in by [place], [place] this morning. She is currently on doxycycline for previous culture result. Patient also had an x-ray done of her left foot that had conflicting results. We asked for clarification and was told we would have clarification on Monday. Patient states overall she is doing well. She tries to stay off of her foot is much as possible. 12/04/2020 patient presents for 1 week follow-up. She is currently taking Augmentin and tolerating this well. It was confirmed that her x-ray had no acute osseous abnormalities. We switched her to collagen from silver alginate last week. Patient is overall doing well and trying to stay off her foot is much as possible. She has no complaints today. 12/11/2020 upon evaluation today patient appears to be doing well with regard to her wound. She has been tolerating the dressing changes without complication. Fortunately there is no signs of active infection noted at this point. I think she  is definitely ready to go back into the cast. 12/18/2020 upon evaluation today patient appears to be doing well with regard to her wound. She has been tolerating the dressing changes without complication. Fortunately there is no signs of active infection at this time. No fevers, chills, nausea, vomiting, or diarrhea. 12/25/2020 upon evaluation today patient appears to be doing well in general in regard to her plantar foot ulcer. With that being said she tells me currently that there does not appear to be any signs of pain or problems which is great in fact the wound appears to be almost completely closed which is also excellent news. I do not see any signs of infection which is great news as well. No fevers, chills, nausea, vomiting, or diarrhea. 5/25; patient presents for 1 week follow-up. She has had the cast in place for the past week and has tolerated this well. She states she is getting orthotics on 5/10. She currently denies signs of infection and has no issues today. 01/08/2021 upon evaluation today patient appears unfortunately to be present today with issues that she has been having with her plantar foot wound. This apparently has reoccurred unfortunately and is causing quite a bit of discomfort and problems for her. There does not appear to be any signs of active infection which is great news and overall I am very pleased with where things stand in that regard but nonetheless I think she is definitely worried about where this can lead due to past history. She was just taken out of the cast and marked is healed last week. Patient History Information obtained from Patient. Family History Cancer - Father, Heart Disease - Mother, Hypertension - Mother, Stroke - Father,Mother, No family history of Diabetes, Hereditary Spherocytosis, Kidney Disease, Lung Disease, Seizures, Thyroid Problems, Tuberculosis. Social History Never smoker, Marital Status - Single, Alcohol Use - Rarely, Drug Use - No  History, Caffeine Use - Daily - coffee. Medical History Respiratory Patient has history of Sleep Apnea - bipap Cardiovascular Patient has history of Deep Vein Thrombosis - right calf, Hypertension Gastrointestinal Patient has history of Colitis Endocrine Patient has history of Type  II Diabetes Musculoskeletal Patient has history of Gout, Osteoarthritis Neurologic Patient has history of Neuropathy Medical A Surgical History Notes nd Cardiovascular Hyperlipidemia Integumentary (Skin) Psoriasis Musculoskeletal Charcot foot Objective Constitutional Well-nourished and well-hydrated in no acute distress. Vitals Time Taken: 2:00 PM, Temperature: 98.6 F, Pulse: 76 bpm, Respiratory Rate: 18 breaths/min, Blood Pressure: 149/80 mmHg. Respiratory normal breathing without difficulty. Psychiatric this patient is able to make decisions and demonstrates good insight into disease process. Alert and Oriented x 3. pleasant and cooperative. General Notes: Upon inspection patient's wound bed actually showed signs of good granulation epithelization at this point. There does not appear to be any signs of active infection which is great. She does have however some callus buildup I did remove the callus and subsequently identified an underlying area here that is a more significant issue to be honest. She still has an open wound and despite the fact that she seemed to be completely healed after the cast was removed last week this has now reoccurred. Obviously that is not what we were looking for in the least. I think she may require a MRI in order to evaluate and see if there is anything underlying that we are missing here. Integumentary (Hair, Skin) Wound #1RRRR status is Open. Original cause of wound was Gradually Appeared. The date acquired was: 05/11/2019. The wound has been in treatment 51 weeks. The wound is located on the [place]. The wound measures 0.1cm length x 0.1cm width x  0.1cm depth; 0.008cm^2 area and 0.001cm^3 volume. There is Fat Layer (Subcutaneous Tissue) exposed. There is no tunneling or undermining noted. There is a medium amount of serosanguineous drainage noted. The wound margin is distinct with the outline attached to the wound base. There is large (67-100%) pink granulation within the wound bed. There is no necrotic tissue within the wound bed. Assessment Active Problems ICD-10 Type 2 diabetes mellitus with foot ulcer Non-pressure chronic ulcer of other part of left foot with fat layer exposed Charcot's joint, left ankle and foot Type 2 diabetes mellitus with diabetic neuropathy, unspecified Procedures Wound #1RRRR Pre-procedure diagnosis of Wound #1RRRR is a Diabetic Wound/Ulcer of the Lower Extremity located on the Left,Medial,Plantar Foot .Severity of Tissue Pre Debridement is: Fat layer exposed. There was a Selective/Open Wound Skin/Epidermis Debridement with a total area of 2.25 sq cm performed by Worthy Keeler, PA. With the following instrument(s): Curette to remove Non-Viable tissue/material. Material removed includes Callus and Skin: Epidermis and. No specimens were taken. A time out was conducted at 14:45, prior to the start of the procedure. A Minimum amount of bleeding was controlled with Pressure. The procedure was tolerated well with a pain level of 0 throughout and a pain level of 0 following the procedure. Post Debridement Measurements: 0.3cm length x 0.4cm width x 0.2cm depth; 0.019cm^3 volume. Character of Wound/Ulcer Post Debridement is improved. Severity of Tissue Post Debridement is: Fat layer exposed. Post procedure Diagnosis Wound #1RRRR: Same as Pre-Procedure Pre-procedure diagnosis of Wound #1RRRR is a Diabetic Wound/Ulcer of the Lower Extremity located on the Left,Medial,Plantar Foot . There was a Total Contact Cast Procedure by Worthy Keeler, PA. Post procedure Diagnosis Wound #1RRRR: Same as  Pre-Procedure Plan Follow-up Appointments: Return Appointment in 1 week. - allow extra time for cast Bathing/ Shower/ Hygiene: May shower and wash wound with soap and water. Off-Loading: T Contact Cast to Left Lower Extremity otal Additional Orders / Instructions: Follow Nutritious Diet Radiology ordered were: MRI, lower extremity with/without contrast left foot - diabetic  foot ulcer left plantar foot, r/o osteomyelitis WOUND #1RRRR: - Foot Wound Laterality: Plantar, Left, Medial Prim Dressing: KerraCel Ag Gelling Fiber Dressing, 2x2 in (silver alginate) 1 x Per Week/7 Days ary Discharge Instructions: Apply silver alginate to wound bed as instructed Secondary Dressing: Woven Gauze Sponges 2x2 in 1 x Per Week/7 Days Discharge Instructions: Apply over primary dressing as directed. Secondary Dressing: Optifoam Non-Adhesive Dressing, 4x4 in 1 x Per Week/7 Days Discharge Instructions: Apply over primary dressing cut to form foam donut Secured With: 7M Medipore H Soft Cloth Surgical T 4 x 2 (in/yd) 1 x Per Week/7 Days ape Discharge Instructions: Secure dressing with tape as directed. 1. Would recommend currently that we go ahead and reinitiate the silver alginate dressing I think that is done well for her in the past. 2. I am also can recommend that we go ahead and reapply the total contact cast which has done well for her in the past especially considering she is going out of town this weekend I think it will help protect her foot. 3. I am also can recommend the patient monitor for anything such as pain or otherwise that could indicate worsening if anything occurs she should let me know soon as possible. 4. I am also can recommend at this time that we have the patient proceed with an MRI to evaluate and see whether there is anything that may need to be addressed in regard to an abnormal finding here. Obviously I think that there is some reason underlined why she keeps reopening and it very  well could be that there is something deeper and that we have not noted previously. Obviously if that is the case that we need to address that to prevent reopening. We will see patient back for reevaluation in 1 week here in the clinic. If anything worsens or changes patient will contact our office for additional recommendations. Electronic Signature(s) Signed: 01/08/2021 3:28:25 PM By: Worthy Keeler PA-C Previous Signature: 01/08/2021 3:26:44 PM Version By: Worthy Keeler PA-C Previous Signature: 01/08/2021 3:25:21 PM Version By: Worthy Keeler PA-C Entered By: Worthy Keeler on 01/08/2021 15:28:25 -------------------------------------------------------------------------------- HxROS Details Patient Name: Date of Service: [name] [name], [name] [name] [name] [name] [name] [name] [name] [name] [name] E. 01/08/2021 1:45 PM Medical Record Number: [id number] Patient Account Number: [id number] Date of Birth/Sex: Treating RN: 03/06/1946 (75 y.o. [name] Other Clinician: Referring Provider: Treating Provider/Extender: Adele Schilder in Treatment: 0 Information Obtained From Patient Respiratory Medical History: Positive for: Sleep Apnea - bipap Cardiovascular Medical History: Positive for: Deep Vein Thrombosis - right calf; Hypertension Past Medical History Notes: Hyperlipidemia Gastrointestinal Medical History: Positive for: Colitis Endocrine Medical History: Positive for: Type II Diabetes Time with diabetes: 2 years Treated with: Insulin, Oral agents Blood sugar tested every day: No Integumentary (Skin) Medical History: Past Medical History Notes: Psoriasis Musculoskeletal Medical History: Positive for: Gout; Osteoarthritis Past Medical History Notes: Charcot foot Neurologic Medical History: Positive for: Neuropathy Immunizations Pneumococcal Vaccine: Received Pneumococcal Vaccination: Yes Implantable Devices None Family and Social History Cancer: Yes - Father;  Diabetes: No; Heart Disease: Yes - Mother; Hereditary Spherocytosis: No; Hypertension: Yes - Mother; Kidney Disease: No; Lung Disease: No; Seizures: No; Stroke: Yes - Father,Mother; Thyroid Problems: No; Tuberculosis: No; Never smoker; Marital Status - Single; Alcohol Use: Rarely; Drug Use: No History; Caffeine Use: Daily - coffee; Financial Concerns: No; Food, Clothing or Shelter Needs: No; Support System Lacking: No; Transportation Concerns: No Electronic Signature(s) Signed: 01/08/2021 5:35:00 PM By:  Worthy Keeler PA-C Signed: 01/08/2021 6:06:42 PM By: [name] RN, BSN Signed: 01/09/2021 1:41:47 PM By: Sandre Kitty Entered By: Sandre Kitty on 01/08/2021 14:06:33 -------------------------------------------------------------------------------- Total Contact Cast Details Patient Name: Date of Service: Anette [name] [name] [name] [name] [name] [name] [name] [name] E. 01/08/2021 1:45 PM Medical Record Number: [id number] Patient Account Number: [id number] Date of Birth/Sex: Treating RN: 09/02/1945 (75 y.o. [name] Other Clinician: Referring Provider: Treating Provider/Extender: Landis Martins Weeks in Treatment: 0 T Contact Cast Applied for Wound Assessment: otal Wound #1RRRR Left,Medial,Plantar Foot Performed By: Physician Worthy Keeler, PA Post Procedure Diagnosis Same as Pre-procedure Electronic Signature(s) Signed: 01/08/2021 5:35:00 PM By: Worthy Keeler PA-C Signed: 01/08/2021 6:06:42 PM By: [name] RN, BSN Entered By: [name] on 01/08/2021 14:57:01 -------------------------------------------------------------------------------- SuperBill Details Patient Name: Date of Service: Anette [name] [name] [name] [name] [name] [name] [name] [name] E. 01/08/2021 Medical Record Number: [id number] Patient Account Number: [id number] Date of Birth/Sex: Treating RN: 1945-10-17 (75 y.o. [name] Other Clinician: Referring Provider: Treating  Provider/Extender: Landis Martins Weeks in Treatment: 0 Diagnosis Coding ICD-10 Codes Code Description [phone] Type 2 diabetes mellitus with foot ulcer L97.522 Non-pressure chronic ulcer of other part of left foot with fat layer exposed M14.672 Charcot's joint, left ankle and foot E11.40 Type 2 diabetes mellitus with diabetic neuropathy, unspecified Facility Procedures CPT4 Code: [id number] Description: [phone] - DEBRIDE WOUND 1ST 20 SQ CM OR < ICD-10 Diagnosis Description L97.522 Non-pressure chronic ulcer of other part of left foot with fat layer exposed Modifier: Quantity: 1 Physician Procedures : CPT4 Code Description Modifier [id number] - WC PHYS LEVEL 4 - EST PT 25 ICD-10 Diagnosis Description L97.522 Non-pressure chronic ulcer of other part of left foot with fat layer exposed E11.621 Type 2 diabetes mellitus with foot ulcer M14.672  Charcot's joint, left ankle and foot E11.40 Type 2 diabetes mellitus with diabetic neuropathy, unspecified Quantity: 1 : [id number] - WC PHYS DEBR WO ANESTH 20 SQ CM ICD-10 Diagnosis Description L97.522 Non-pressure chronic ulcer of other part of left foot with fat layer exposed Quantity: 1 Electronic Signature(s) Signed: 01/08/2021 3:27:07 PM By: Worthy Keeler PA-C Entered By: Worthy Keeler on 01/08/2021 15:27:07

## 2021-01-09 NOTE — Progress Notes (Signed)
[name] [name] ([id number]) Visit Report for 01/08/2021 Arrival Information Details Patient Name: Date of Service: [name] [name], [name] E. 01/08/2021 1:45 PM Medical Record Number: [id number] Patient Account Number: [id number] Date of Birth/Sex: Treating RN: 10/31/1945 (75 y.o. [name] Primary Care Kindal Ponti: [name] Other Clinician: Referring Brooklyn Jeff: Treating Cory Rama/Extender: Adele Schilder in Treatment: 0 Visit Information History Since Last Visit Added or deleted any medications: No Patient Arrived: Wheel Chair Any new allergies or adverse reactions: No Arrival Time: 13:59 Had a fall or experienced change in No Accompanied By: self activities of daily living that may affect Transfer Assistance: None risk of falls: Patient Identification Verified: Yes Signs or symptoms of abuse/neglect since last visito No Secondary Verification Process Completed: Yes Hospitalized since last visit: No Implantable device outside of the clinic excluding No cellular tissue based products placed in the center since last visit: Has Dressing in Place as Prescribed: Yes Pain Present Now: No Electronic Signature(s) Signed: 01/09/2021 1:41:47 PM By: Sandre Kitty Entered By: Sandre Kitty on 01/08/2021 14:00:30 -------------------------------------------------------------------------------- Clinic Level of Care Assessment Details Patient Name: Date of Service: [name], [name] 01/08/2021 1:45 PM Medical Record Number: [id number] Patient Account Number: [id number] Date of Birth/Sex: Treating RN: Dec 26, 1945 (75 y.o. [name] Primary Care Amberlee Garvey: [name] Other Clinician: Referring Athea Haley: Treating Maxine Huynh/Extender: Adele Schilder in Treatment: 0 Clinic Level of Care Assessment Items TOOL 1 Quantity Score []  - 0 Use when EandM and Procedure is performed on INITIAL visit ASSESSMENTS - Nursing Assessment / Reassessment X- 1  20 General Physical Exam (combine w/ comprehensive assessment (listed just below) when performed on new pt. evals) X- 1 25 Comprehensive Assessment (HX, ROS, Risk Assessments, Wounds Hx, etc.) ASSESSMENTS - Wound and Skin Assessment / Reassessment []  - 0 Dermatologic / Skin Assessment (not related to wound area) ASSESSMENTS - Ostomy and/or Continence Assessment and Care []  - 0 Incontinence Assessment and Management []  - 0 Ostomy Care Assessment and Management (repouching, etc.) PROCESS - Coordination of Care X - Simple Patient / Family Education for ongoing care 1 15 []  - 0 Complex (extensive) Patient / Family Education for ongoing care X- 1 10 Staff obtains Programmer, systems, Records, T Results / Process Orders est []  - 0 Staff telephones HHA, Nursing Homes / Clarify orders / etc []  - 0 Routine Transfer to another Facility (non-emergent condition) []  - 0 Routine Hospital Admission (non-emergent condition) X- 1 15 New Admissions / Biomedical engineer / Ordering NPWT Apligraf, etc. , []  - 0 Emergency Hospital Admission (emergent condition) PROCESS - Special Needs []  - 0 Pediatric / Minor Patient Management []  - 0 Isolation Patient Management []  - 0 Hearing / Language / Visual special needs []  - 0 Assessment of Community assistance (transportation, D/C planning, etc.) []  - 0 Additional assistance / Altered mentation []  - 0 Support Surface(s) Assessment (bed, cushion, seat, etc.) INTERVENTIONS - Miscellaneous []  - 0 External ear exam []  - 0 Patient Transfer (multiple staff / Civil Service fast streamer / Similar devices) []  - 0 Simple Staple / Suture removal (25 or less) []  - 0 Complex Staple / Suture removal (26 or more) []  - 0 Hypo/Hyperglycemic Management (do not check if billed separately) []  - 0 Ankle / Brachial Index (ABI) - do not check if billed separately Has the patient been seen at the hospital within the last three years: Yes Total Score: 85 Level Of Care:  New/Established - Level 3 Electronic Signature(s) Signed: 01/08/2021 6:06:42 PM  By: [name] RN, BSN Entered By: [name] on 01/08/2021 14:50:42 -------------------------------------------------------------------------------- Encounter Discharge Information Details Patient Name: Date of Service: [name] [name], [name] RA E. 01/08/2021 1:45 PM Medical Record Number: [id number] Patient Account Number: [id number] Date of Birth/Sex: Treating RN: [birth year] (75 y.o. [name], Lauren Primary Care Arelie Kuzel: [name] Other Clinician: Referring Ronya Gilcrest: Treating Raynette Arras/Extender: Adele Schilder in Treatment: 0 Encounter Discharge Information Items Post Procedure Vitals Discharge Condition: Stable Temperature (F): 97.4 Ambulatory Status: Wheelchair Pulse (bpm): 74 Discharge Destination: Home Respiratory Rate (breaths/min): 17 Transportation: Private Auto Blood Pressure (mmHg): 134/74 Accompanied By: self Schedule Follow-up Appointment: Yes Clinical Summary of Care: Patient Declined Electronic Signature(s) Signed: 01/09/2021 12:00:56 PM By: Rhae Hammock RN Entered By: Rhae Hammock on 01/08/2021 15:38:39 -------------------------------------------------------------------------------- Lower Extremity Assessment Details Patient Name: Date of Service: [name] [name], [name] RA E. 01/08/2021 1:45 PM Medical Record Number: [id number] Patient Account Number: [id number] Date of Birth/Sex: Treating RN: 1945-10-21 (75 y.o. [name] Primary Care Jeannifer Drakeford: [name] Other Clinician: Referring Hardie Veltre: Treating Bronsen Serano/Extender: Landis Martins Weeks in Treatment: 0 Edema Assessment Assessed: [Left: Yes] [Right: No] E[Left: dema] [Right: :] Calf Left: Right: Point of Measurement: 32 cm From Medial Instep 37 cm Ankle Left: Right: Point of Measurement: 8 cm From Medial Instep 23.7 cm Vascular Assessment Pulses: Dorsalis Pedis Palpable:  [Left:Yes] Electronic Signature(s) Signed: 01/08/2021 6:06:42 PM By: [name] RN, BSN Signed: 01/09/2021 1:41:47 PM By: Sandre Kitty Entered By: Sandre Kitty on 01/08/2021 14:08:21 -------------------------------------------------------------------------------- Multi-Disciplinary Care Plan Details Patient Name: Date of Service: [name] Amato RA E. 01/08/2021 1:45 PM Medical Record Number: [id number] Patient Account Number: [id number] Date of Birth/Sex: Treating RN: 1946/03/31 (75 y.o. [name] Primary Care Breella Vanostrand: [name] Other Clinician: Referring Joani Cosma: Treating Abdulkadir Emmanuel/Extender: Landis Martins Weeks in Treatment: 0 Active Inactive Wound/Skin Impairment Nursing Diagnoses: Impaired tissue integrity Knowledge deficit related to ulceration/compromised skin integrity Goals: Patient/caregiver will verbalize understanding of skin care regimen Date Initiated: 01/08/2021 Target Resolution Date: 02/05/2021 Goal Status: Active Ulcer/skin breakdown will have a volume reduction of 30% by week 4 Date Initiated: 01/08/2021 Target Resolution Date: 02/05/2021 Goal Status: Active Interventions: Assess patient/caregiver ability to obtain necessary supplies Assess patient/caregiver ability to perform ulcer/skin care regimen upon admission and as needed Provide education on ulcer and skin care Treatment Activities: Skin care regimen initiated : 01/08/2021 Topical wound management initiated : 01/08/2021 Notes: Electronic Signature(s) Signed: 01/08/2021 6:06:42 PM By: [name] RN, BSN Entered By: [name] on 01/08/2021 14:49:00 -------------------------------------------------------------------------------- Pain Assessment Details Patient Name: Date of Service: [name] Amato RA E. 01/08/2021 1:45 PM Medical Record Number: [id number] Patient Account Number: [id number] Date of Birth/Sex: Treating RN: 04-13-1946 (75 y.o. [name] Primary  Care Omie Ferger: [name] Other Clinician: Referring Rozlynn Lippold: Treating Shaylah Mcghie/Extender: Landis Martins Weeks in Treatment: 0 Active Problems Location of Pain Severity and Description of Pain Patient Has Paino No Site Locations Pain Management and Medication Current Pain Management: Electronic Signature(s) Signed: 01/08/2021 6:06:42 PM By: [name] RN, BSN Signed: 01/09/2021 1:41:47 PM By: Sandre Kitty Entered By: Sandre Kitty on 01/08/2021 14:00:55 -------------------------------------------------------------------------------- Patient/Caregiver Education Details Patient Name: Date of Service: [name] Reason 6/1/2022andnbsp1:45 PM Medical Record Number: [id number] Patient Account Number: [id number] Date of Birth/Gender: Treating RN: Jun 22, 1946 (75 y.o. [name] Primary Care Physician: [name] Other Clinician: Referring Physician: Treating Physician/Extender: Adele Schilder in Treatment: 0 Education Assessment Education Provided To: Patient  Education Topics Provided Offloading: Methods: Explain/Verbal Responses: Reinforcements needed, State content correctly Wound/Skin Impairment: Methods: Explain/Verbal Responses: Reinforcements needed, State content correctly Electronic Signature(s) Signed: 01/08/2021 6:06:42 PM By: [name] RN, BSN Entered By: [name] on 01/08/2021 14:49:40 -------------------------------------------------------------------------------- Wound Assessment Details Patient Name: Date of Service: [name] [name], [name] RA E. 01/08/2021 1:45 PM Medical Record Number: [id number] Patient Account Number: [id number] Date of Birth/Sex: Treating RN: [birth year] (75 y.o. [name] Primary Care Jasha Hodzic: [name] Other Clinician: Referring Marshon Bangs: Treating Heyden Jaber/Extender: Landis Martins Weeks in Treatment: 0 Wound Status Wound Number: [id number] Primary Diabetic  Wound/Ulcer of the Lower Extremity Etiology: Wound Location: Left, Medial, Plantar Foot Wound Open Wounding Event: Gradually Appeared Status: Date Acquired: 05/11/2019 Comorbid Sleep Apnea, Deep Vein Thrombosis, Hypertension, Colitis, Type Weeks Of Treatment: 51 History: II Diabetes, Gout, Osteoarthritis, Neuropathy Clustered Wound: No Photos Wound Measurements Length: (cm) 0.1 Width: (cm) 0.1 Depth: (cm) 0.1 Area: (cm) 0.008 Volume: (cm) 0.001 % Reduction in Area: 99.2% % Reduction in Volume: 99.8% Tunneling: No Undermining: No Wound Description Classification: Grade 1 Wound Margin: Distinct, outline attached Exudate Amount: Medium Exudate Type: Serosanguineous Exudate Color: red, brown Foul Odor After Cleansing: No Slough/Fibrino No Wound Bed Granulation Amount: Large (67-100%) Exposed Structure Granulation Quality: Pink Fascia Exposed: No Necrotic Amount: None Present (0%) Fat Layer (Subcutaneous Tissue) Exposed: Yes Tendon Exposed: No Muscle Exposed: No Joint Exposed: No Bone Exposed: No Treatment Notes Wound #[id number] (Foot) Wound Laterality: Plantar, Left, Medial Cleanser Peri-Wound Care Topical Primary Dressing KerraCel Ag Gelling Fiber Dressing, 2x2 in (silver alginate) Discharge Instruction: Apply silver alginate to wound bed as instructed Secondary Dressing Woven Gauze Sponges 2x2 in Discharge Instruction: Apply over primary dressing as directed. Optifoam Non-Adhesive Dressing, 4x4 in Discharge Instruction: Apply over primary dressing cut to form foam donut Secured With 39M Medipore H Soft Cloth Surgical T 4 x 2 (in/yd) ape Discharge Instruction: Secure dressing with tape as directed. Compression Wrap Compression Stockings Add-Ons Electronic Signature(s) Signed: 01/08/2021 6:06:42 PM By: [name] RN, BSN Signed: 01/09/2021 1:41:47 PM By: Sandre Kitty Entered By: Sandre Kitty on 01/08/2021  16:58:26 -------------------------------------------------------------------------------- Vitals Details Patient Name: Date of Service: [name] [name], [name] RA E. 01/08/2021 1:45 PM Medical Record Number: [id number] Patient Account Number: [id number] Date of Birth/Sex: Treating RN: October 03, 1945 (75 y.o. [name] Primary Care Tayven Renteria: [name] Other Clinician: Referring Raynor Calcaterra: Treating Burris Matherne/Extender: Landis Martins Weeks in Treatment: 0 Vital Signs Time Taken: 14:00 Temperature (F): 98.6 Pulse (bpm): 76 Respiratory Rate (breaths/min): 18 Blood Pressure (mmHg): 149/80 Reference Range: 80 - 120 mg / dl Electronic Signature(s) Signed: 01/09/2021 1:41:47 PM By: Sandre Kitty Entered By: Sandre Kitty on 01/08/2021 14:00:47

## 2021-01-14 ENCOUNTER — Other Ambulatory Visit: Payer: Self-pay

## 2021-01-14 ENCOUNTER — Ambulatory Visit (HOSPITAL_COMMUNITY)
Admission: RE | Admit: 2021-01-14 | Discharge: 2021-01-14 | Disposition: A | Discharge status: Home / Self Care | Payer: Medicare Other | Source: Ambulatory Visit | Attending: Physician Assistant | Admitting: Physician Assistant

## 2021-01-14 DIAGNOSIS — E13621 Other specified diabetes mellitus with foot ulcer: Secondary | ICD-10-CM | POA: Insufficient documentation

## 2021-01-14 DIAGNOSIS — E11621 Type 2 diabetes mellitus with foot ulcer: Secondary | ICD-10-CM | POA: Diagnosis not present

## 2021-01-14 DIAGNOSIS — L97529 Non-pressure chronic ulcer of other part of left foot with unspecified severity: Secondary | ICD-10-CM | POA: Diagnosis not present

## 2021-01-14 MED ORDER — GADOBUTROL 1 MMOL/ML IV SOLN
10.0000 mL | Freq: Once | INTRAVENOUS | Status: AC | PRN
  Administered 2021-01-14: 10 mL via INTRAVENOUS

## 2021-01-15 ENCOUNTER — Encounter (HOSPITAL_BASED_OUTPATIENT_CLINIC_OR_DEPARTMENT_OTHER): Payer: Medicare Other | Admitting: Physician Assistant

## 2021-01-15 DIAGNOSIS — E1151 Type 2 diabetes mellitus with diabetic peripheral angiopathy without gangrene: Secondary | ICD-10-CM | POA: Diagnosis not present

## 2021-01-15 DIAGNOSIS — E11621 Type 2 diabetes mellitus with foot ulcer: Secondary | ICD-10-CM | POA: Diagnosis not present

## 2021-01-15 DIAGNOSIS — L97522 Non-pressure chronic ulcer of other part of left foot with fat layer exposed: Secondary | ICD-10-CM | POA: Diagnosis not present

## 2021-01-15 DIAGNOSIS — E1140 Type 2 diabetes mellitus with diabetic neuropathy, unspecified: Secondary | ICD-10-CM | POA: Diagnosis not present

## 2021-01-15 NOTE — Progress Notes (Addendum)
WILSON, SAMPLE ([id number]) Visit Report for 01/15/2021 Chief Complaint Document Details Patient Name: Date of Service: [name] [name] 01/15/2021 2:45 PM Medical Record Number: [id number] Patient Account Number: [id number] Date of Birth/Sex: Treating RN: 09-06-1945 (75 y.o. [name] Other Clinician: Referring Provider: Treating Provider/Extender: Landis Martins Weeks in Treatment: 1 Information Obtained from: Patient Chief Complaint Left foot ulcer Electronic Signature(s) Signed: 01/15/2021 3:56:22 PM By: Worthy Keeler PA-C Entered By: Worthy Keeler on 01/15/2021 15:56:22 -------------------------------------------------------------------------------- Debridement Details Patient Name: Date of Service: [name] [name] 01/15/2021 2:45 PM Medical Record Number: [id number] Patient Account Number: [id number] Date of Birth/Sex: Treating RN: November 25, 1945 (75 y.o. [name] Other Clinician: Referring Provider: Treating Provider/Extender: Landis Martins Weeks in Treatment: 1 Debridement Performed for Assessment: Wound #[id number] Left,Medial,Plantar Foot Performed By: Physician Worthy Keeler, PA Debridement Type: Debridement Severity of Tissue Pre Debridement: Fat layer exposed Level of Consciousness (Pre-procedure): Awake and Alert Pre-procedure Verification/Time Out Yes - 16:10 Taken: Start Time: 16:10 T Area Debrided (L x W): otal 1 (cm) x 1 (cm) = 1 (cm) Tissue and other material debrided: Non-Viable, Callus, Skin: Epidermis Level: Skin/Epidermis Debridement Description: Selective/Open Wound Instrument: Curette Bleeding: None End Time: 16:15 Procedural Pain: 0 Post Procedural Pain: 0 Response to Treatment: Procedure was tolerated well Level of Consciousness (Post- Awake and Alert procedure): Post Debridement Measurements of Total Wound Length: (cm)  0.2 Width: (cm) 0.2 Depth: (cm) 0.1 Volume: (cm) 0.003 Character of Wound/Ulcer Post Debridement: Improved Severity of Tissue Post Debridement: Fat layer exposed Post Procedure Diagnosis Same as Pre-procedure Electronic Signature(s) Signed: 01/15/2021 5:40:25 PM By: Worthy Keeler PA-C Signed: 01/15/2021 6:12:56 PM By: [name] RN, BSN Entered By: [name] on 01/15/2021 16:14:56 -------------------------------------------------------------------------------- HPI Details Patient Name: Date of Service: [name] [name] [place] RBA RA E. 01/15/2021 2:45 PM Medical Record Number: [id number] Patient Account Number: [id number] Date of Birth/Sex: Treating RN: 1946-05-23 (75 y.o. [name] Other Clinician: Referring Provider: Treating Provider/Extender: Adele Schilder in Treatment: 1 History of Present Illness HPI Description: 01/17/2020 upon evaluation today patient presents for initial evaluation here in our clinic concerning issues she has been having with a left medial/plantar foot ulcer. This is actually been an issue for her since October 2020. She has been seeing Dr. Doran Durand for quite some time during that course. Fortunately there is no signs of active infection at this time. Or least no mention of this to have seen in general. With that being said unfortunately I do see some signs of erythema noted today that does have me concerned about the possibility of infection at this point in the surrounding area of the wound. There is also a warm to touch at the site which is somewhat concerning. Fortunately there is no evidence of systemic infection which is great news. The patient does have a history of diabetes mellitus type 2, Charcot foot which is what led to the wound, and hypertension. She notes that she was in a cast for some time with Dr. Doran Durand for about 8 weeks. During that time they were utilizing according to the patient silver  nitrate along with a foam doughnut and then Coban to secure in place in the cast in place. With that being said I do not have the actual records to review we are going to try to get a hold of  those unfortunately they would not flow over into care everywhere I did look today. She has been seeing Dr. Doran Durand and his physician assistant Larkin Ina up until the end of May and apparently is still seeing them on a regular basis every 2 weeks roughly. She has also tried Iodosorb without effect here. 01/24/2020 upon evaluation today patient actually appears to be doing quite well with regard to her wounds. She has been tolerating the dressing changes without complication. Fortunately there is no signs of active infection spreading which is good news. Her culture did show signs of Staph aureus I did place her on Augmentin due to the erythema surrounding the wound. With that being said the wound does appear to be doing better she has her longer walking cast/boot and I think that is actually good for her for the time being. I am considering reinitiating total contact cast when she gets back from vacation but next week she will actually be out of town at [employer] she knows not to get in the water but she still obviously is planning to enjoy herself she is going to take it easy on her foot however. 02/07/2020 upon evaluation today patient appears to be doing fairly well in regard to her ulcer on her foot. Fortunately there is no signs of severe infection at this time which is great news and overall very pleased in that regard. With that being said I do think that she could still benefit from a total contact cast. Nonetheless she is using her walking boot which at least provide some protection and that it prevents some of the friction occurring when she is ambulating. 02/14/2020 upon evaluation today patient appears to be doing well with regard to her foot ulcer. This is actually measuring a little bit smaller yet again this  week. Overall very pleased with where things stand and I do not see any signs of active infection at this time which is also good news. Since she is measuring better the patient has wanting to somewhat hold off on proceeding with the total contact cast which I think is reasonable at this point. 02/28/2020 on evaluation today patient appears to be doing well in general in regard to her wound although she has a lot of callus buildup as compared to last time I saw her. This is can require sharp debridement today. I do believe she really needs the total contact cast as well which we have discussed previous. 7/23; patient comes in for a total contact cast change 03/06/2020 on evaluation today patient appears to be doing quite well with regard to her wounds. Fortunately the wound bed is measuring smaller and looking much better there is little callus noted although there is some debridement necessary today. 03/13/2020 on evaluation today patient's wound actually appears to be doing excellent which is great news. With that being said unfortunately she is having some issues currently with her left leg where she does have cellulitis it appears. This may have come from an area that rubbed underneath the cast from last week that we noted we padded that area and it looks to be doing excellent at this point but nonetheless the leg was somewhat painful, swollen, and somewhat erythematous. She also had an elevated white blood cell count of 11.5 based on what I saw on looking at her records from the med center in [facility] from where she was seen yesterday. Unfortunately with [place] having a provider on vacation there was no one here in the  clinic in the afternoon when she called therefore she went to the ER as advised. Subsequently they did not cut off the cast as they did not have anyone from orthopedics there to do so and subsequently also did not have the ability to do the Doppler for evaluation of DVT They recommended  therefore given her dose of Eliquis as well as . Augmentin and sent her home to come see [place] today to have the cast taken off and then she is supposed to go back to have the study for DVT performed they are following. 03/20/2020 upon evaluation today patient appears to be doing well with regard to her foot all things considered we have not been able to use the total contact cast due to the infection that she had last week. She has been on the doxycycline and she had a 10-day supply of that I do believe that is helping and her leg appears to be doing better. With that being said there is fortunately no signs of active infection systemically at this time which is good news. No fevers, chills, nausea, vomiting, or diarrhea. 03/27/2020 upon evaluation today patient appears to be doing well with regard to her foot ulcer. There does not appear to be signs of active infection which is great news. Overall I am very pleased with where things stand at this point. 04/03/2020 upon evaluation today patient appears to be doing pretty well in regard to the overall appearance of her wound. Fortunately there is no signs of active infection at this time which is great news. No fevers, chills, nausea, vomiting, or diarrhea. With that being said she does have some blue-green drainage that actually is a little bit concerning to me for the possibility of Pseudomonas. I discussed that with the patient today. With that being said I do believe that we may be able to manage this however with the topical antibiotic cream as opposed to having to do anything oral especially since she seems to be doing so well with overall appearance of the wound. 04/10/2020 on evaluation today patient appears to be doing about the same roughly in regard to the overall size of her wound. With that being said she fortunately has not shown any signs of worsening overall which is good news. I do believe that she is doing a great job trying to offload but  again she may still do better with the cast. I do not see in the blue-green drainage that we noticed previously I do believe the gentamicin help in this regard. 04/17/2020 on evaluation today patient's wound appears to be doing about the same at this point. There is no significant improvement at this point. No fever chills noted. She is up for put the cast back on the day. That she states in a couple weeks she will need to have this off to go to a workshop. 04/24/2020 on evaluation today patient appears to be doing significantly better in regard to her wound. Fortunately there is no signs of active infection and overall feel like she is making great progress the cast seems to have done excellent for her. 05/01/2020 upon evaluation today patient presents for reevaluation she really does not appear to be doing too badly in regard to the actual wound on the left foot we have been managing. Unfortunately she has bilateral lower extremity edema with blisters between the webspace of her first and second toe on both feet. She has a tremendous amount of edema in the legs which I  think is where this is coming from it does not appear to be infected but nonetheless I do believe this is can be something that needs to be addressed today. Obviously this means we probably will not be putting the cast on at this point. She attributes this to the fact that she was sitting with her feet on the floor much longer during a conference last week she had a great time but unfortunately had a lot of complications as a result. 05/08/2020 upon evaluation today patient appears to be doing somewhat better in regard to her wounds at this time. Fortunately there is no signs of active infection which is great news. With that being said I do believe that the blisters have ruptured and unfortunately did not just reattach I will remove some of the blistered tissue today. With that being said I do think the wound itself on the plantar aspect  of left foot does need to have sharp debridement. 05/15/2020 upon evaluation today patient appears to be doing about the same in regard to her foot ulcer. Unfortunately in the past week her husband had a fall where he sustained a mild traumatic brain bleed. Fortunately he is doing better but being that he was in the hospital she had a walk on this a lot more. The wound does not appear to be any better is also not really appearing to be significantly worse which is good news. There is no signs of active infection at this time. 10/14; patient with a small diabetic wound on the medial part of her left foot. We have been using silver collagen a total contact cast making good progress. I think the patient had a series of blisters on her dorsal foot probably secondary to having her legs recumbent for 3 days while in a conference in [place]. We wrapped her leg last week these are all healed. We did not previously have her in compression on the right leg. 05/29/2020 upon evaluation today patient appears to be doing well with regard to the wound on the plantar aspect of her foot medially. This is measuring smaller and looking much better than last time I saw her. Again when I did see her last was 2 weeks back and the wound was significantly larger. I do believe the cast is helping and I believe the collagen is a good option for her. 06/05/2020 on evaluation today patient appears to be doing well with regard to her foot ulcer this is actually measuring significantly better and overall I feel like she is doing excellent. There is no signs of active infection at this time. 06/12/2020 upon evaluation today patient actually continues to show signs of good improvement which is excellent news. I am extremely pleased with how she seems to be progressing at this point in regard to her wound. There is still some depth to the wound but I do believe the collagen is helping her quite a bit. 06/19/2020 upon evaluation today  patient appears to be doing well with regard to her plantar foot ulcer. She is actually making excellent progress and in fact this appears to be almost completely healed. With that being said I do believe that the patient is going to actually require 1 more week in the cast although after that I am hopeful she will be ready for discharge. 06/26/2020 on evaluation today patient appears to be doing well in regard to her wound currently. Fortunately there is no signs of active infection in general I feel like she  is doing excellent. This appears to be completely healed I think she is ready to come out of the cast. 11/29; patient comes back in the clinic today with a very quick reopening in the exact same area on the medial plantar foot. She had been healed out last time. She went back into some new balance shoes that she got at [employer] with a custom insert. As it turns out this wound also happened when wearing these shoes although there was some modification made I think with the wound initially happened they added some foam around the wound area. This obviously is not going to be sufficient. 07/17/2020 on evaluation today patient appears to be doing well with regard to her wound. Fortunately she seems to be making good progress. Unfortunately she was in the hospital due to a issue with colitis and had just been discharged today in fact. She tells me that her biggest concern here is that a lot of her numbers especially her creatinine were somewhat elevated and problematic. She is can be following up with her provider in order to have a further work-up at this time. With that being said she did need to come back in for a cast change. She did not allow them to remove the cast due to the fact that she did not feel like that was the issue whatsoever and indeed it does not appear that was the case her wound appears to be doing excellent today. 07/24/2020 upon evaluation today patient appears to be doing well in  regard to her foot ulcer. She still has a small opening but this is showing signs of excellent improvement overall but they were very close to complete resolution. No fevers, chills, nausea, vomiting, or diarrhea. 07/31/2020 upon evaluation today patient actually appears to be doing excellent she is actually completely healed this is great news. Fortunately there is no signs of active infection at this time. No fevers, chills, nausea, vomiting, or diarrhea. The patient tells me that she did see Dr. Paulla Dolly in order to get to [place] so that she can have a custom shoe made. She saw him earlier last week. She does have an appointment with Liliane Channel on the sixth I believe on January she tells me Readmission: 08/28/2020 on evaluation today patient appears to be doing poorly in regard to her wound currently. She tells me this has reopened. The good news that she did get measured for and actually her shoes are on the way in from [facility].. With that being said she has been trying to stay off of this is much as possible using her wheelchair around home. Nonetheless has been somewhat difficult. The good news is her shoes should be here next week 09/04/2020 upon evaluation today patient appears to be doing well with regard to her wound. There is a little bit of callus buildup but nothing too significant she does tell me she was very active this week. Fortunately there is no signs of systemic infection at this point. The dorsal foot wound actually appears to be doing better. I think this is healed. 09/11/2020 upon evaluation today patient appears to actually be doing quite well in my opinion based on what I am seeing today. Fortunately there is no signs of infection in fact her mother is certain that this is even open any longer based on what I see. Fortunately I think the patient has been doing everything she can to try to keep this under control. 09/18/2020 upon evaluation today patient appears  to be doing about the  same in regard to her foot ulcer. She has been tolerating the dressing changes without complication. Fortunately there is no sign of active infection at this time. No fevers, chills, nausea, vomiting, or diarrhea. She is going require some sharp debridement today. 09/25/2020 upon evaluation today patient appears to be doing well with regard to his wounds she has been tolerating the dressing changes without complication. Her wound appears to be completely healed based on what I am seeing at this point. There does not appear to be any signs of active infection at this time which is great news. No fevers, chills, nausea, vomiting, or diarrhea. She did get the cushion for her foot as well that she ordered to try to help keep pressure off of this area. That looks like it may be very beneficial for her to be honest. 10/02/2020 upon evaluation today patient appears to be doing more poorly in regard to her foot ulcer. Unfortunately this has reopened since we saw her last week. It apparently did not take too long at all for this to happen. She is obviously somewhat disappointed as she was hopeful that that would be time this thing would stay closed. Fortunately there is no evidence of active infection at this point. No fevers, chills, nausea, vomiting, or diarrhea. With that being said I am good have to perform a little bit of sharp debridement clear away some of the debris currently. Including a minimal amount of callus at this time. 10/16/2020 upon evaluation today patient appears to be doing about the same in regard to her foot ulcer unfortunately. There does not appear to be any signs of active infection which is great news. No fever chills noted she has been tolerating the dressing changes without complication which is great news. Unfortunately she tells me that she has been very depressed about the situation is getting very frustrating to her that she continues to have issues despite everything that she is  trying to do to stay off of her foot. She is extremely discouraged and I do hate to hear this. Obviously we have been hopeful that if she got her shoes that would make a difference she is actually can be getting those tomorrow but I am not certain that that alone is good to be enough to get this to heal. We may end up having to consider going back into a total contact cast to get this to heal and then subsequently once we get her healed get her into her new diabetic shoes which will and my hope anyway keep this from reopening again. 10/23/2020 upon evaluation today patient appears to be doing a little worse both in regard to the size of her wound as well as in regard to the fact that she has erythema surrounding the wound and wrap around the lateral part of her foot where she tells me has been somewhat sore. Fortunately there does not appear to be any evidence of infection systemically but locally definitely there is erythema and warmth consistent with local cellulitis. I am glad to remove some of the callus as well. 10/30/2020 upon evaluation today patient appears to be doing well with regard to her wound on the plantar aspect of her foot. This is significantly improved compared to last week. 10/30/2020 upon evaluation today patient actually is making excellent progress her wound appears to show signs of great improvement which is wonderful and that she is extremely pleased to hear this. She actually leaves Sunday  to go on her trip with her half sister and friends. 11/13/2020 on evaluation today patient's wound actually appears to be doing quite well. There does not appear to be any signs of infection it has been 2 weeks since have seen her she does have a little bit of callus buildup here today but at the same time I do not believe the vacation time set her back any just has not really made a lot of significant improvement. She is done with her antibiotics at this point. 11/20/2020 upon evaluation today  patient appears to be doing poorly in regard to her foot. In fact this appears to be showing signs of Infection. She has erythema and warmth that is concerning. I know she is very discouraged as this seems to be a recurrent issue. I think we may need to delve further into the possibility of something deeper going on here as far as a structural infection. 11/27/2020 patient presents for 1 week follow-up. She had a culture of her left foot ulcer that grew staph aureus sensitive to Augmentin. This was called in by [place], [place] this morning. She is currently on doxycycline for previous culture result. Patient also had an x-ray done of her left foot that had conflicting results. We asked for clarification and was told we would have clarification on Monday. Patient states overall she is doing well. She tries to stay off of her foot is much as possible. 12/04/2020 patient presents for 1 week follow-up. She is currently taking Augmentin and tolerating this well. It was confirmed that her x-ray had no acute osseous abnormalities. We switched her to collagen from silver alginate last week. Patient is overall doing well and trying to stay off her foot is much as possible. She has no complaints today. 12/11/2020 upon evaluation today patient appears to be doing well with regard to her wound. She has been tolerating the dressing changes without complication. Fortunately there is no signs of active infection noted at this point. I think she is definitely ready to go back into the cast. 12/18/2020 upon evaluation today patient appears to be doing well with regard to her wound. She has been tolerating the dressing changes without complication. Fortunately there is no signs of active infection at this time. No fevers, chills, nausea, vomiting, or diarrhea. 12/25/2020 upon evaluation today patient appears to be doing well in general in regard to her plantar foot ulcer. With that being said she tells me currently that there does  not appear to be any signs of pain or problems which is great in fact the wound appears to be almost completely closed which is also excellent news. I do not see any signs of infection which is great news as well. No fevers, chills, nausea, vomiting, or diarrhea. 5/25; patient presents for 1 week follow-up. She has had the cast in place for the past week and has tolerated this well. She states she is getting orthotics on 5/10. She currently denies signs of infection and has no issues today. 01/08/2021 upon evaluation today patient appears unfortunately to be present today with issues that she has been having with her plantar foot wound. This apparently has reoccurred unfortunately and is causing quite a bit of discomfort and problems for her. There does not appear to be any signs of active infection which is great news and overall I am very pleased with where things stand in that regard but nonetheless I think she is definitely worried about where this can lead due to  past history. She was just taken out of the cast and marked is healed last week. 01/15/2021 upon evaluation today patient unfortunately appears to be doing well but nonetheless continues to have issues with pressure and friction to her foot whenever she is not wearing the cast. Again today I think the wound is significantly improved and almost completely healed in 1 week's time after debridement last week. Nonetheless this has continued to be an issue for that reason we did do a MRI with contrast which really revealed no evidence whatsoever of an issue here. Again this is great but nonetheless still offers up the question what regularly to keep this from continuing to just reopen every time that we see. Again I really do not know exactly what to do is I feel like this falls on the ground outside of wound care more into a issue more specifically with the orthopedic side of things. I understand the concern that a brace would probably be  problematic for the patient. That makes complete sense to be honest. It would probably cause rubbing on other areas which would not be good. With that being said also I am not sure what else we can do other than just keeping her in the cast which is really not a feasible option either. Every time I take her out of the cast this reopens almost immediately. Electronic Signature(s) Signed: 01/15/2021 4:59:17 PM By: Worthy Keeler PA-C Entered By: Worthy Keeler on 01/15/2021 16:59:17 -------------------------------------------------------------------------------- Physical Exam Details Patient Name: Date of Service: [name] [name] RBA RA E. 01/15/2021 2:45 PM Medical Record Number: [id number] Patient Account Number: [id number] Date of Birth/Sex: Treating RN: 1946-01-03 (75 y.o. [name] Other Clinician: Referring Provider: Treating Provider/Extender: Landis Martins Weeks in Treatment: 1 Constitutional Well-nourished and well-hydrated in no acute distress. Respiratory normal breathing without difficulty. Psychiatric this patient is able to make decisions and demonstrates good insight into disease process. Alert and Oriented x 3. pleasant and cooperative. Notes Upon inspection patient's wound bed actually showed signs of good granulation epithelization at this point. There does not appear to be any evidence of infection which is great news in general I am extremely pleased with where we stand at this point. With that being said the big issue here that we have discussed multiple times and her husband asking inquires about this today as well is what we can do to prevent this from continuing to be an issue obviously this has been an issue for quite some time back-and-forth. We get her healed and this reopens in short order following. Electronic Signature(s) Signed: 01/15/2021 5:36:35 PM By: Worthy Keeler PA-C Entered By: Worthy Keeler on  01/15/2021 17:36:35 -------------------------------------------------------------------------------- Physician Orders Details Patient Name: Date of Service: [name] [name] RBA RA E. 01/15/2021 2:45 PM Medical Record Number: [id number] Patient Account Number: [id number] Date of Birth/Sex: Treating RN: [birth year] (75 y.o. [name] Other Clinician: Referring Provider: Treating Provider/Extender: Adele Schilder in Treatment: 1 Verbal / Phone Orders: No Diagnosis Coding ICD-10 Coding Code Description E11.621 Type 2 diabetes mellitus with foot ulcer L97.522 Non-pressure chronic ulcer of other part of left foot with fat layer exposed M14.672 Charcot's joint, left ankle and foot E11.40 Type 2 diabetes mellitus with diabetic neuropathy, unspecified Follow-up Appointments ppointment in 1 week. - allow extra time for cast Return A Bathing/ Shower/ Hygiene May shower and wash wound with soap and water.  Off-Loading Open toe surgical shoe to: - left foot Additional Orders / Instructions Follow Nutritious Diet Wound Treatment Wound #1RRRR - Foot Wound Laterality: Plantar, Left, Medial Prim Dressing: KerraCel Ag Gelling Fiber Dressing, 2x2 in (silver alginate) 1 x Per Week/7 Days ary Discharge Instructions: Apply silver alginate to wound bed as instructed Secondary Dressing: Woven Gauze Sponges 2x2 in 1 x Per Week/7 Days Discharge Instructions: Apply over primary dressing as directed. Secondary Dressing: Optifoam Non-Adhesive Dressing, 4x4 in 1 x Per Week/7 Days Discharge Instructions: Apply over primary dressing cut to form foam donut Secured With: 60M Medipore H Soft Cloth Surgical T 4 x 2 (in/yd) 1 x Per Week/7 Days ape Discharge Instructions: Secure dressing with tape as directed. Electronic Signature(s) Signed: 01/15/2021 5:40:25 PM By: Worthy Keeler PA-C Signed: 01/15/2021 6:12:56 PM By: [name] RN, BSN Entered By:  [name] on 01/15/2021 16:23:51 -------------------------------------------------------------------------------- Problem List Details Patient Name: Date of Service: [name] Guarneri RBA RA E. 01/15/2021 2:45 PM Medical Record Number: [id number] Patient Account Number: [id number] Date of Birth/Sex: Treating RN: May 21, 1946 (75 y.o. [name] Other Clinician: Referring Provider: Treating Provider/Extender: Landis Martins Weeks in Treatment: 1 Active Problems ICD-10 Encounter Code Description Active Date MDM Diagnosis E11.621 Type 2 diabetes mellitus with foot ulcer 01/08/2021 No Yes L97.522 Non-pressure chronic ulcer of other part of left foot with fat layer exposed 01/08/2021 No Yes M14.672 Charcot's joint, left ankle and foot 01/08/2021 No Yes E11.40 Type 2 diabetes mellitus with diabetic neuropathy, unspecified 01/08/2021 No Yes Inactive Problems Resolved Problems Electronic Signature(s) Signed: 01/15/2021 3:56:12 PM By: Worthy Keeler PA-C Entered By: Worthy Keeler on 01/15/2021 15:56:12 -------------------------------------------------------------------------------- Progress Note Details Patient Name: Date of Service: [name] [name] RBA RA E. 01/15/2021 2:45 PM Medical Record Number: [id number] Patient Account Number: [id number] Date of Birth/Sex: Treating RN: 01-09-1946 (75 y.o. [name] Other Clinician: Referring Provider: Treating Provider/Extender: Adele Schilder in Treatment: 1 Subjective Chief Complaint Information obtained from Patient Left foot ulcer History of Present Illness (HPI) 01/17/2020 upon evaluation today patient presents for initial evaluation here in our clinic concerning issues she has been having with a left medial/plantar foot ulcer. This is actually been an issue for her since October 2020. She has been seeing Dr. Doran Durand for quite some time  during that course. Fortunately there is no signs of active infection at this time. Or least no mention of this to have seen in general. With that being said unfortunately I do see some signs of erythema noted today that does have me concerned about the possibility of infection at this point in the surrounding area of the wound. There is also a warm to touch at the site which is somewhat concerning. Fortunately there is no evidence of systemic infection which is great news. The patient does have a history of diabetes mellitus type 2, Charcot foot which is what led to the wound, and hypertension. She notes that she was in a cast for some time with Dr. Doran Durand for about 8 weeks. During that time they were utilizing according to the patient silver nitrate along with a foam doughnut and then Coban to secure in place in the cast in place. With that being said I do not have the actual records to review we are going to try to get a hold of those unfortunately they would not flow over into care everywhere I did look today.  She has been seeing Dr. Doran Durand and his physician assistant Larkin Ina up until the end of May and apparently is still seeing them on a regular basis every 2 weeks roughly. She has also tried Iodosorb without effect here. 01/24/2020 upon evaluation today patient actually appears to be doing quite well with regard to her wounds. She has been tolerating the dressing changes without complication. Fortunately there is no signs of active infection spreading which is good news. Her culture did show signs of Staph aureus I did place her on Augmentin due to the erythema surrounding the wound. With that being said the wound does appear to be doing better she has her longer walking cast/boot and I think that is actually good for her for the time being. I am considering reinitiating total contact cast when she gets back from vacation but next week she will actually be out of town at [employer] she knows not to  get in the water but she still obviously is planning to enjoy herself she is going to take it easy on her foot however. 02/07/2020 upon evaluation today patient appears to be doing fairly well in regard to her ulcer on her foot. Fortunately there is no signs of severe infection at this time which is great news and overall very pleased in that regard. With that being said I do think that she could still benefit from a total contact cast. Nonetheless she is using her walking boot which at least provide some protection and that it prevents some of the friction occurring when she is ambulating. 02/14/2020 upon evaluation today patient appears to be doing well with regard to her foot ulcer. This is actually measuring a little bit smaller yet again this week. Overall very pleased with where things stand and I do not see any signs of active infection at this time which is also good news. Since she is measuring better the patient has wanting to somewhat hold off on proceeding with the total contact cast which I think is reasonable at this point. 02/28/2020 on evaluation today patient appears to be doing well in general in regard to her wound although she has a lot of callus buildup as compared to last time I saw her. This is can require sharp debridement today. I do believe she really needs the total contact cast as well which we have discussed previous. 7/23; patient comes in for a total contact cast change 03/06/2020 on evaluation today patient appears to be doing quite well with regard to her wounds. Fortunately the wound bed is measuring smaller and looking much better there is little callus noted although there is some debridement necessary today. 03/13/2020 on evaluation today patient's wound actually appears to be doing excellent which is great news. With that being said unfortunately she is having some issues currently with her left leg where she does have cellulitis it appears. This may have come from an  area that rubbed underneath the cast from last week that we noted we padded that area and it looks to be doing excellent at this point but nonetheless the leg was somewhat painful, swollen, and somewhat erythematous. She also had an elevated white blood cell count of 11.5 based on what I saw on looking at her records from the med center in [facility] from where she was seen yesterday. Unfortunately with [place] having a provider on vacation there was no one here in the clinic in the afternoon when she called therefore she went to the ER  as advised. Subsequently they did not cut off the cast as they did not have anyone from orthopedics there to do so and subsequently also did not have the ability to do the Doppler for evaluation of DVT They recommended therefore given her dose of Eliquis as well as . Augmentin and sent her home to come see [place] today to have the cast taken off and then she is supposed to go back to have the study for DVT performed they are following. 03/20/2020 upon evaluation today patient appears to be doing well with regard to her foot all things considered we have not been able to use the total contact cast due to the infection that she had last week. She has been on the doxycycline and she had a 10-day supply of that I do believe that is helping and her leg appears to be doing better. With that being said there is fortunately no signs of active infection systemically at this time which is good news. No fevers, chills, nausea, vomiting, or diarrhea. 03/27/2020 upon evaluation today patient appears to be doing well with regard to her foot ulcer. There does not appear to be signs of active infection which is great news. Overall I am very pleased with where things stand at this point. 04/03/2020 upon evaluation today patient appears to be doing pretty well in regard to the overall appearance of her wound. Fortunately there is no signs of active infection at this time which is great news. No  fevers, chills, nausea, vomiting, or diarrhea. With that being said she does have some blue-green drainage that actually is a little bit concerning to me for the possibility of Pseudomonas. I discussed that with the patient today. With that being said I do believe that we may be able to manage this however with the topical antibiotic cream as opposed to having to do anything oral especially since she seems to be doing so well with overall appearance of the wound. 04/10/2020 on evaluation today patient appears to be doing about the same roughly in regard to the overall size of her wound. With that being said she fortunately has not shown any signs of worsening overall which is good news. I do believe that she is doing a great job trying to offload but again she may still do better with the cast. I do not see in the blue-green drainage that we noticed previously I do believe the gentamicin help in this regard. 04/17/2020 on evaluation today patient's wound appears to be doing about the same at this point. There is no significant improvement at this point. No fever chills noted. She is up for put the cast back on the day. That she states in a couple weeks she will need to have this off to go to a workshop. 04/24/2020 on evaluation today patient appears to be doing significantly better in regard to her wound. Fortunately there is no signs of active infection and overall feel like she is making great progress the cast seems to have done excellent for her. 05/01/2020 upon evaluation today patient presents for reevaluation she really does not appear to be doing too badly in regard to the actual wound on the left foot we have been managing. Unfortunately she has bilateral lower extremity edema with blisters between the webspace of her first and second toe on both feet. She has a tremendous amount of edema in the legs which I think is where this is coming from it does not appear to be infected  but nonetheless I  do believe this is can be something that needs to be addressed today. Obviously this means we probably will not be putting the cast on at this point. She attributes this to the fact that she was sitting with her feet on the floor much longer during a conference last week she had a great time but unfortunately had a lot of complications as a result. 05/08/2020 upon evaluation today patient appears to be doing somewhat better in regard to her wounds at this time. Fortunately there is no signs of active infection which is great news. With that being said I do believe that the blisters have ruptured and unfortunately did not just reattach I will remove some of the blistered tissue today. With that being said I do think the wound itself on the plantar aspect of left foot does need to have sharp debridement. 05/15/2020 upon evaluation today patient appears to be doing about the same in regard to her foot ulcer. Unfortunately in the past week her husband had a fall where he sustained a mild traumatic brain bleed. Fortunately he is doing better but being that he was in the hospital she had a walk on this a lot more. The wound does not appear to be any better is also not really appearing to be significantly worse which is good news. There is no signs of active infection at this time. 10/14; patient with a small diabetic wound on the medial part of her left foot. We have been using silver collagen a total contact cast making good progress. I think the patient had a series of blisters on her dorsal foot probably secondary to having her legs recumbent for 3 days while in a conference in [place]. We wrapped her leg last week these are all healed. We did not previously have her in compression on the right leg. 05/29/2020 upon evaluation today patient appears to be doing well with regard to the wound on the plantar aspect of her foot medially. This is measuring smaller and looking much better than last time I saw her.  Again when I did see her last was 2 weeks back and the wound was significantly larger. I do believe the cast is helping and I believe the collagen is a good option for her. 06/05/2020 on evaluation today patient appears to be doing well with regard to her foot ulcer this is actually measuring significantly better and overall I feel like she is doing excellent. There is no signs of active infection at this time. 06/12/2020 upon evaluation today patient actually continues to show signs of good improvement which is excellent news. I am extremely pleased with how she seems to be progressing at this point in regard to her wound. There is still some depth to the wound but I do believe the collagen is helping her quite a bit. 06/19/2020 upon evaluation today patient appears to be doing well with regard to her plantar foot ulcer. She is actually making excellent progress and in fact this appears to be almost completely healed. With that being said I do believe that the patient is going to actually require 1 more week in the cast although after that I am hopeful she will be ready for discharge. 06/26/2020 on evaluation today patient appears to be doing well in regard to her wound currently. Fortunately there is no signs of active infection in general I feel like she is doing excellent. This appears to be completely healed I think she is ready  to come out of the cast. 11/29; patient comes back in the clinic today with a very quick reopening in the exact same area on the medial plantar foot. She had been healed out last time. She went back into some new balance shoes that she got at [employer] with a custom insert. As it turns out this wound also happened when wearing these shoes although there was some modification made I think with the wound initially happened they added some foam around the wound area. This obviously is not going to be sufficient. 07/17/2020 on evaluation today patient appears to be doing well  with regard to her wound. Fortunately she seems to be making good progress. Unfortunately she was in the hospital due to a issue with colitis and had just been discharged today in fact. She tells me that her biggest concern here is that a lot of her numbers especially her creatinine were somewhat elevated and problematic. She is can be following up with her provider in order to have a further work-up at this time. With that being said she did need to come back in for a cast change. She did not allow them to remove the cast due to the fact that she did not feel like that was the issue whatsoever and indeed it does not appear that was the case her wound appears to be doing excellent today. 07/24/2020 upon evaluation today patient appears to be doing well in regard to her foot ulcer. She still has a small opening but this is showing signs of excellent improvement overall but they were very close to complete resolution. No fevers, chills, nausea, vomiting, or diarrhea. 07/31/2020 upon evaluation today patient actually appears to be doing excellent she is actually completely healed this is great news. Fortunately there is no signs of active infection at this time. No fevers, chills, nausea, vomiting, or diarrhea. The patient tells me that she did see Dr. Paulla Dolly in order to get to [employer] so that she can have a custom shoe made. She saw him earlier last week. She does have an appointment with Liliane Channel on the sixth I believe on January she tells me Readmission: 08/28/2020 on evaluation today patient appears to be doing poorly in regard to her wound currently. She tells me this has reopened. The good news that she did get measured for and actually her shoes are on the way in from [facility].. With that being said she has been trying to stay off of this is much as possible using her wheelchair around home. Nonetheless has been somewhat difficult. The good news is her shoes should be here next week 09/04/2020 upon  evaluation today patient appears to be doing well with regard to her wound. There is a little bit of callus buildup but nothing too significant she does tell me she was very active this week. Fortunately there is no signs of systemic infection at this point. The dorsal foot wound actually appears to be doing better. I think this is healed. 09/11/2020 upon evaluation today patient appears to actually be doing quite well in my opinion based on what I am seeing today. Fortunately there is no signs of infection in fact her mother is certain that this is even open any longer based on what I see. Fortunately I think the patient has been doing everything she can to try to keep this under control. 09/18/2020 upon evaluation today patient appears to be doing about the same in regard to her foot ulcer. She  has been tolerating the dressing changes without complication. Fortunately there is no sign of active infection at this time. No fevers, chills, nausea, vomiting, or diarrhea. She is going require some sharp debridement today. 09/25/2020 upon evaluation today patient appears to be doing well with regard to his wounds she has been tolerating the dressing changes without complication. Her wound appears to be completely healed based on what I am seeing at this point. There does not appear to be any signs of active infection at this time which is great news. No fevers, chills, nausea, vomiting, or diarrhea. She did get the cushion for her foot as well that she ordered to try to help keep pressure off of this area. That looks like it may be very beneficial for her to be honest. 10/02/2020 upon evaluation today patient appears to be doing more poorly in regard to her foot ulcer. Unfortunately this has reopened since we saw her last week. It apparently did not take too long at all for this to happen. She is obviously somewhat disappointed as she was hopeful that that would be time this thing would stay closed. Fortunately  there is no evidence of active infection at this point. No fevers, chills, nausea, vomiting, or diarrhea. With that being said I am good have to perform a little bit of sharp debridement clear away some of the debris currently. Including a minimal amount of callus at this time. 10/16/2020 upon evaluation today patient appears to be doing about the same in regard to her foot ulcer unfortunately. There does not appear to be any signs of active infection which is great news. No fever chills noted she has been tolerating the dressing changes without complication which is great news. Unfortunately she tells me that she has been very depressed about the situation is getting very frustrating to her that she continues to have issues despite everything that she is trying to do to stay off of her foot. She is extremely discouraged and I do hate to hear this. Obviously we have been hopeful that if she got her shoes that would make a difference she is actually can be getting those tomorrow but I am not certain that that alone is good to be enough to get this to heal. We may end up having to consider going back into a total contact cast to get this to heal and then subsequently once we get her healed get her into her new diabetic shoes which will and my hope anyway keep this from reopening again. 10/23/2020 upon evaluation today patient appears to be doing a little worse both in regard to the size of her wound as well as in regard to the fact that she has erythema surrounding the wound and wrap around the lateral part of her foot where she tells me has been somewhat sore. Fortunately there does not appear to be any evidence of infection systemically but locally definitely there is erythema and warmth consistent with local cellulitis. I am glad to remove some of the callus as well. 10/30/2020 upon evaluation today patient appears to be doing well with regard to her wound on the plantar aspect of her foot. This is  significantly improved compared to last week. 10/30/2020 upon evaluation today patient actually is making excellent progress her wound appears to show signs of great improvement which is wonderful and that she is extremely pleased to hear this. She actually leaves Sunday to go on her trip with her half sister and friends. 11/13/2020 on  evaluation today patient's wound actually appears to be doing quite well. There does not appear to be any signs of infection it has been 2 weeks since have seen her she does have a little bit of callus buildup here today but at the same time I do not believe the vacation time set her back any just has not really made a lot of significant improvement. She is done with her antibiotics at this point. 11/20/2020 upon evaluation today patient appears to be doing poorly in regard to her foot. In fact this appears to be showing signs of Infection. She has erythema and warmth that is concerning. I know she is very discouraged as this seems to be a recurrent issue. I think we may need to delve further into the possibility of something deeper going on here as far as a structural infection. 11/27/2020 patient presents for 1 week follow-up. She had a culture of her left foot ulcer that grew staph aureus sensitive to Augmentin. This was called in by [place], [place] this morning. She is currently on doxycycline for previous culture result. Patient also had an x-ray done of her left foot that had conflicting results. We asked for clarification and was told we would have clarification on Monday. Patient states overall she is doing well. She tries to stay off of her foot is much as possible. 12/04/2020 patient presents for 1 week follow-up. She is currently taking Augmentin and tolerating this well. It was confirmed that her x-ray had no acute osseous abnormalities. We switched her to collagen from silver alginate last week. Patient is overall doing well and trying to stay off her foot is much  as possible. She has no complaints today. 12/11/2020 upon evaluation today patient appears to be doing well with regard to her wound. She has been tolerating the dressing changes without complication. Fortunately there is no signs of active infection noted at this point. I think she is definitely ready to go back into the cast. 12/18/2020 upon evaluation today patient appears to be doing well with regard to her wound. She has been tolerating the dressing changes without complication. Fortunately there is no signs of active infection at this time. No fevers, chills, nausea, vomiting, or diarrhea. 12/25/2020 upon evaluation today patient appears to be doing well in general in regard to her plantar foot ulcer. With that being said she tells me currently that there does not appear to be any signs of pain or problems which is great in fact the wound appears to be almost completely closed which is also excellent news. I do not see any signs of infection which is great news as well. No fevers, chills, nausea, vomiting, or diarrhea. 5/25; patient presents for 1 week follow-up. She has had the cast in place for the past week and has tolerated this well. She states she is getting orthotics on 5/10. She currently denies signs of infection and has no issues today. 01/08/2021 upon evaluation today patient appears unfortunately to be present today with issues that she has been having with her plantar foot wound. This apparently has reoccurred unfortunately and is causing quite a bit of discomfort and problems for her. There does not appear to be any signs of active infection which is great news and overall I am very pleased with where things stand in that regard but nonetheless I think she is definitely worried about where this can lead due to past history. She was just taken out of the cast and marked is healed  last week. 01/15/2021 upon evaluation today patient unfortunately appears to be doing well but nonetheless  continues to have issues with pressure and friction to her foot whenever she is not wearing the cast. Again today I think the wound is significantly improved and almost completely healed in 1 week's time after debridement last week. Nonetheless this has continued to be an issue for that reason we did do a MRI with contrast which really revealed no evidence whatsoever of an issue here. Again this is great but nonetheless still offers up the question what regularly to keep this from continuing to just reopen every time that we see. Again I really do not know exactly what to do is I feel like this falls on the ground outside of wound care more into a issue more specifically with the orthopedic side of things. I understand the concern that a brace would probably be problematic for the patient. That makes complete sense to be honest. It would probably cause rubbing on other areas which would not be good. With that being said also I am not sure what else we can do other than just keeping her in the cast which is really not a feasible option either. Every time I take her out of the cast this reopens almost immediately. Objective Constitutional Well-nourished and well-hydrated in no acute distress. Vitals Time Taken: 3:45 PM, Temperature: 98.9 F, Pulse: 80 bpm, Respiratory Rate: 29 breaths/min, Blood Pressure: 153/84 mmHg. Respiratory normal breathing without difficulty. Psychiatric this patient is able to make decisions and demonstrates good insight into disease process. Alert and Oriented x 3. pleasant and cooperative. General Notes: Upon inspection patient's wound bed actually showed signs of good granulation epithelization at this point. There does not appear to be any evidence of infection which is great news in general I am extremely pleased with where we stand at this point. With that being said the big issue here that we have discussed multiple times and her husband asking inquires about this  today as well is what we can do to prevent this from continuing to be an issue obviously this has been an issue for quite some time back-and-forth. We get her healed and this reopens in short order following. Integumentary (Hair, Skin) Wound #1RRRR status is Open. Original cause of wound was Gradually Appeared. The date acquired was: 05/11/2019. The wound has been in treatment 52 weeks. The wound is located on the [place]. The wound measures 0cm length x 0cm width x 0cm depth; 0cm^2 area and 0cm^3 volume. There is no tunneling or undermining noted. There is a none present amount of drainage noted. The wound margin is distinct with the outline attached to the wound base. There is no granulation within the wound bed. There is no necrotic tissue within the wound bed. Assessment Active Problems ICD-10 Type 2 diabetes mellitus with foot ulcer Non-pressure chronic ulcer of other part of left foot with fat layer exposed Charcot's joint, left ankle and foot Type 2 diabetes mellitus with diabetic neuropathy, unspecified Procedures Wound #1RRRR Pre-procedure diagnosis of Wound #1RRRR is a Diabetic Wound/Ulcer of the Lower Extremity located on the Left,Medial,Plantar Foot .Severity of Tissue Pre Debridement is: Fat layer exposed. There was a Selective/Open Wound Skin/Epidermis Debridement with a total area of 1 sq cm performed by Worthy Keeler, PA. With the following instrument(s): Curette to remove Non-Viable tissue/material. Material removed includes Callus and Skin: Epidermis and. No specimens were taken. A time out was conducted at 16:10, prior to the  start of the procedure. There was no bleeding. The procedure was tolerated well with a pain level of 0 throughout and a pain level of 0 following the procedure. Post Debridement Measurements: 0.2cm length x 0.2cm width x 0.1cm depth; 0.003cm^3 volume. Character of Wound/Ulcer Post Debridement is improved. Severity of Tissue Post  Debridement is: Fat layer exposed. Post procedure Diagnosis Wound #1RRRR: Same as Pre-Procedure Plan Follow-up Appointments: Return Appointment in 1 week. - allow extra time for cast Bathing/ Shower/ Hygiene: May shower and wash wound with soap and water. Off-Loading: Open toe surgical shoe to: - left foot Additional Orders / Instructions: Follow Nutritious Diet WOUND #1RRRR: - Foot Wound Laterality: Plantar, Left, Medial Prim Dressing: KerraCel Ag Gelling Fiber Dressing, 2x2 in (silver alginate) 1 x Per Week/7 Days ary Discharge Instructions: Apply silver alginate to wound bed as instructed Secondary Dressing: Woven Gauze Sponges 2x2 in 1 x Per Week/7 Days Discharge Instructions: Apply over primary dressing as directed. Secondary Dressing: Optifoam Non-Adhesive Dressing, 4x4 in 1 x Per Week/7 Days Discharge Instructions: Apply over primary dressing cut to form foam donut Secured With: 32M Medipore H Soft Cloth Surgical T 4 x 2 (in/yd) 1 x Per Week/7 Days ape Discharge Instructions: Secure dressing with tape as directed. 1. Would recommend currently that we put the total contact cast on hold as she supposed to be going to have her shoes checked on Friday we need to have her not in the cast so they can check things out appropriately. 2. We will continue with the silver alginate dressing that seems to have done well I see no reason to do anything different. 3. I am also can recommend that we use the border foam dressing for padding. 4. I am also going to suggest the patient try to limit her walking over the next week we will see where things stand at follow-up. We will see patient back for reevaluation in 1 week here in the clinic. If anything worsens or changes patient will contact our office for additional recommendations. Of note I have put a call into Dr. Doran Durand or Justin's PA in order to discuss this patient if there is anything they feel like they can do to help out with regard  to getting things doing better for her. Obviously the main goal is what we can do to try and prevent this from continuing to reopen. I want to see if there is any suggestions he thinks could be explored in this regard. Obviously the patient is very frustrated she even mentioned the possibility of a trip to "[place]" if things did not get better and she was looking at possible amputation as she states she could not live like that obviously this would be looking out a possibility of physician assisted suicide. Again that is definitely not the kind of conversation that I will to be having with her especially in light of the fact that we can get the wound healed we just need to figure out what we can do to try to keep it that way. Electronic Signature(s) Signed: 01/15/2021 5:39:54 PM By: Worthy Keeler PA-C Previous Signature: 01/15/2021 5:37:20 PM Version By: Worthy Keeler PA-C Entered By: Worthy Keeler on 01/15/2021 17:39:53 -------------------------------------------------------------------------------- SuperBill Details Patient Name: Date of Service: [name] [name] RBA RA E. 01/15/2021 Medical Record Number: [id number] Patient Account Number: [id number] Date of Birth/Sex: Treating RN: 05-11-1946 (75 y.o. [name] Other Clinician: Referring Provider: Treating Provider/Extender: Earlean Shawl,  Stacy Weeks in Treatment: 1 Diagnosis Coding ICD-10 Codes Code Description E11.621 Type 2 diabetes mellitus with foot ulcer L97.522 Non-pressure chronic ulcer of other part of left foot with fat layer exposed M14.672 Charcot's joint, left ankle and foot E11.40 Type 2 diabetes mellitus with diabetic neuropathy, unspecified Facility Procedures CPT4 Code: [id number] Description: [phone] - DEBRIDE WOUND 1ST 20 SQ CM OR < ICD-10 Diagnosis Description L97.522 Non-pressure chronic ulcer of other part of left foot with fat layer exposed Modifier: Quantity:  1 Physician Procedures : CPT4 Code Description Modifier [id number] - WC PHYS DEBR WO ANESTH 20 SQ CM ICD-10 Diagnosis Description L97.522 Non-pressure chronic ulcer of other part of left foot with fat layer exposed Quantity: 1 Electronic Signature(s) Signed: 01/15/2021 5:40:10 PM By: Worthy Keeler PA-C Entered By: Worthy Keeler on 01/15/2021 17:40:09

## 2021-01-15 NOTE — Progress Notes (Signed)
[name], [name] ([id number]) Visit Report for 01/15/2021 Arrival Information Details Patient Name: Date of Service: [name] [name], [name] E. 01/15/2021 2:45 PM Medical Record Number: [id number] Patient Account Number: [id number] Date of Birth/Sex: Treating RN: 10/17/1945 (75 y.o. Helene Shoe, Meta.Reding Primary Care Tandra Rosado: [name] Other Clinician: Referring Ammara Raj: Treating Seba Madole/Extender: Adele Schilder in Treatment: 1 Visit Information History Since Last Visit Added or deleted any medications: No Patient Arrived: Wheel Chair Any new allergies or adverse reactions: No Arrival Time: 15:45 Had a fall or experienced change in No Accompanied By: husband activities of daily living that may affect Transfer Assistance: None risk of falls: Patient Identification Verified: Yes Signs or symptoms of abuse/neglect since last visito No Secondary Verification Process Completed: Yes Hospitalized since last visit: No Patient Requires Transmission-Based Precautions: No Implantable device outside of the clinic excluding No cellular tissue based products placed in the center since last visit: Has Dressing in Place as Prescribed: Yes Has Footwear/Offloading in Place as Prescribed: Yes Left: Wedge Shoe Pain Present Now: No Electronic Signature(s) Signed: 01/15/2021 6:05:11 PM By: Deon Pilling Entered By: Deon Pilling on 01/15/2021 15:48:45 -------------------------------------------------------------------------------- Lower Extremity Assessment Details Patient Name: Date of Service: [name] [name], [name] E. 01/15/2021 2:45 PM Medical Record Number: [id number] Patient Account Number: [id number] Date of Birth/Sex: Treating RN: 1945/09/07 (75 y.o. [name] [name] Primary Care Shariya Gaster: [name] Other Clinician: Referring Makalyn Lennox: Treating Dian Minahan/Extender: Landis Martins Weeks in Treatment: 1 Edema Assessment Assessed: [Left: No] [Right: No] [Left: Edema]  [Right: :] Calf Left: Right: Point of Measurement: 32 cm From Medial Instep 37 cm Ankle Left: Right: Point of Measurement: 8 cm From Medial Instep 23.7 cm Vascular Assessment Pulses: Dorsalis Pedis Palpable: [Left:Yes] Electronic Signature(s) Signed: 01/15/2021 5:57:05 PM By: Levan Hurst RN, BSN Entered By: Levan Hurst on 01/15/2021 16:02:18 -------------------------------------------------------------------------------- [place] Details Patient Name: Date of Service: [name] Amato RA E. 01/15/2021 2:45 PM Medical Record Number: [id number] Patient Account Number: [id number] Date of Birth/Sex: Treating RN: 09-15-1945 (75 y.o. [name] [name] Primary Care Jayceion Lisenby: [name] Other Clinician: Referring Shetara Launer: Treating Shloimy Michalski/Extender: Adele Schilder in Treatment: 1 [place] reviewed with physician Active Inactive Wound/Skin Impairment Nursing Diagnoses: Impaired tissue integrity Knowledge deficit related to ulceration/compromised skin integrity Goals: Patient/caregiver will verbalize understanding of skin care regimen Date Initiated: 01/08/2021 Target Resolution Date: 02/05/2021 Goal Status: Active Ulcer/skin breakdown will have a volume reduction of 30% by week 4 Date Initiated: 01/08/2021 Target Resolution Date: 02/05/2021 Goal Status: Active Interventions: Assess patient/caregiver ability to obtain necessary supplies Assess patient/caregiver ability to perform ulcer/skin care regimen upon admission and as needed Provide education on ulcer and skin care Treatment Activities: Skin care regimen initiated : 01/08/2021 Topical wound management initiated : 01/08/2021 Notes: Electronic Signature(s) Signed: 01/15/2021 6:12:56 PM By: [name] RN, BSN Entered By: [name] on 01/15/2021 16:06:56 -------------------------------------------------------------------------------- Pain Assessment  Details Patient Name: Date of Service: [name] Amato RA E. 01/15/2021 2:45 PM Medical Record Number: [id number] Patient Account Number: [id number] Date of Birth/Sex: Treating RN: July 27, 1946 (75 y.o. [name] Primary Care Jadamarie Butson: [name] Other Clinician: Referring Odetta Forness: Treating Damaris Geers/Extender: Landis Martins Weeks in Treatment: 1 Active Problems Location of Pain Severity and Description of Pain Patient Has Paino No Site Locations Rate the pain. Current Pain Level: 0 Pain Management and Medication Current Pain Management: Medication: No Cold Application: No Rest: No Massage: No Activity: No T.[name]: No Heat Application:  No Leg drop or elevation: No Is the Current Pain Management Adequate: Adequate How does your wound impact your activities of daily livingo Sleep: No Bathing: No Appetite: No Relationship With Others: No Bladder Continence: No Emotions: No Bowel Continence: No Work: No Toileting: No Drive: No Dressing: No Hobbies: No Electronic Signature(s) Signed: 01/15/2021 6:05:11 PM By: Deon Pilling Entered By: Deon Pilling on 01/15/2021 15:49:11 -------------------------------------------------------------------------------- Patient/Caregiver Education Details Patient Name: Date of Service: [name] [name] 6/8/2022andnbsp2:45 PM Medical Record Number: [id number] Patient Account Number: [id number] Date of Birth/Gender: Treating RN: 1945-08-23 (75 y.o. [name] [name] Primary Care Physician: [name] Other Clinician: Referring Physician: Treating Physician/Extender: Adele Schilder in Treatment: 1 Education Assessment Education Provided To: Patient Education Topics Provided Offloading: Methods: Explain/Verbal Responses: Reinforcements needed, State content correctly Wound/Skin Impairment: Methods: Explain/Verbal Responses: Reinforcements needed, State content correctly Electronic  Signature(s) Signed: 01/15/2021 6:12:56 PM By: [name] RN, BSN Entered By: [name] on 01/15/2021 16:07:19 -------------------------------------------------------------------------------- Wound Assessment Details Patient Name: Date of Service: [name] Amato RA E. 01/15/2021 2:45 PM Medical Record Number: [id number] Patient Account Number: [id number] Date of Birth/Sex: Treating RN: 1946-01-07 (75 y.o. Helene Shoe, Tammi Klippel Primary Care Damarea Merkel: [name] Other Clinician: Referring Rylee Huestis: Treating Jadynn Epping/Extender: Landis Martins Weeks in Treatment: 1 Wound Status Wound Number: [id number] Primary Diabetic Wound/Ulcer of the Lower Extremity Etiology: Wound Location: Left, Medial, Plantar Foot Wound Open Wounding Event: Gradually Appeared Status: Date Acquired: 05/11/2019 Comorbid Sleep Apnea, Deep Vein Thrombosis, Hypertension, Colitis, Type Weeks Of Treatment: 52 History: II Diabetes, Gout, Osteoarthritis, Neuropathy Clustered Wound: No Photos Wound Measurements Length: (cm) Width: (cm) Depth: (cm) Area: (cm) Volume: (cm) 0 % Reduction in Area: 100% 0 % Reduction in Volume: 100% 0 Epithelialization: Large (67-100%) 0 Tunneling: No 0 Undermining: No Wound Description Classification: Grade 1 Wound Margin: Distinct, outline attached Exudate Amount: None Present Foul Odor After Cleansing: No Slough/Fibrino No Wound Bed Granulation Amount: None Present (0%) Exposed Structure Necrotic Amount: None Present (0%) Fascia Exposed: No Fat Layer (Subcutaneous Tissue) Exposed: No Tendon Exposed: No Muscle Exposed: No Joint Exposed: No Bone Exposed: No Electronic Signature(s) Signed: 01/15/2021 5:19:11 PM By: Sandre Kitty Signed: 01/15/2021 6:05:11 PM By: Deon Pilling Entered By: Sandre Kitty on 01/15/2021 16:38:03 -------------------------------------------------------------------------------- [place] Details Patient Name: Date of  Service: Reinoso, [place] RBA RA E. 01/15/2021 2:45 PM Medical Record Number: [id number] Patient Account Number: [id number] Date of Birth/Sex: Treating RN: [birth year] (75 y.o. [name] Primary Care Kyung Muto: [name] Other Clinician: Referring Finnleigh Marchetti: Treating Keajah Killough/Extender: Landis Martins Weeks in Treatment: 1 Vital Signs Time Taken: 15:45 Temperature (F): 98.9 Pulse (bpm): 80 Respiratory Rate (breaths/min): 29 Blood Pressure (mmHg): 153/84 Reference Range: 80 - 120 mg / dl Electronic Signature(s) Signed: 01/15/2021 6:05:11 PM By: Deon Pilling Entered By: Deon Pilling on 01/15/2021 15:49:02

## 2021-01-16 ENCOUNTER — Other Ambulatory Visit: Payer: Self-pay

## 2021-01-16 DIAGNOSIS — M170 Bilateral primary osteoarthritis of knee: Secondary | ICD-10-CM | POA: Diagnosis not present

## 2021-01-17 ENCOUNTER — Other Ambulatory Visit: Payer: Medicare Other

## 2021-01-22 ENCOUNTER — Other Ambulatory Visit: Payer: Self-pay

## 2021-01-22 ENCOUNTER — Encounter (HOSPITAL_BASED_OUTPATIENT_CLINIC_OR_DEPARTMENT_OTHER): Payer: Medicare Other | Admitting: Physician Assistant

## 2021-01-22 DIAGNOSIS — L97522 Non-pressure chronic ulcer of other part of left foot with fat layer exposed: Secondary | ICD-10-CM | POA: Diagnosis not present

## 2021-01-22 DIAGNOSIS — E1140 Type 2 diabetes mellitus with diabetic neuropathy, unspecified: Secondary | ICD-10-CM | POA: Diagnosis not present

## 2021-01-22 DIAGNOSIS — E1151 Type 2 diabetes mellitus with diabetic peripheral angiopathy without gangrene: Secondary | ICD-10-CM | POA: Diagnosis not present

## 2021-01-22 DIAGNOSIS — E11621 Type 2 diabetes mellitus with foot ulcer: Secondary | ICD-10-CM | POA: Diagnosis not present

## 2021-01-22 NOTE — Progress Notes (Addendum)
[name], [name] ([id number]) Visit Report for 01/22/2021 Chief Complaint Document Details Patient Name: Date of Service: [name] [name], [name] E. 01/22/2021 3:45 PM Medical Record Number: [id number] Patient Account Number: [id number] Date of Birth/Sex: Treating RN: [birth year] (75 y.o. [name] Other Clinician: Referring Provider: Treating Provider/Extender: Landis Martins Weeks in Treatment: 2 Information Obtained from: Patient Chief Complaint Left foot ulcer Electronic Signature(s) Signed: 01/22/2021 4:32:33 PM By: Worthy Keeler PA-C Entered By: Worthy Keeler on 01/22/2021 16:32:32 -------------------------------------------------------------------------------- Debridement Details Patient Name: Date of Service: [name] [name] 01/22/2021 3:45 PM Medical Record Number: [id number] Patient Account Number: [id number] Date of Birth/Sex: Treating RN: 04/19/1946 (75 y.o. [name] Other Clinician: Referring Provider: Treating Provider/Extender: Landis Martins Weeks in Treatment: 2 Debridement Performed for Assessment: Wound #[id number] Left,Medial,Plantar Foot Performed By: Physician Worthy Keeler, PA Debridement Type: Debridement Severity of Tissue Pre Debridement: Fat layer exposed Level of Consciousness (Pre-procedure): Awake and Alert Pre-procedure Verification/Time Out Yes - 16:35 Taken: Start Time: 16:37 Pain Control: Lidocaine 4% T opical Solution T Area Debrided (L x W): otal 0.5 (cm) x 0.5 (cm) = 0.25 (cm) Tissue and other material debrided: Non-Viable, Callus, Skin: Epidermis Level: Skin/Epidermis Debridement Description: Selective/Open Wound Instrument: Curette Bleeding: Minimum Hemostasis Achieved: Pressure End Time: 16:40 Procedural Pain: 0 Post Procedural Pain: 0 Response to Treatment: Procedure was tolerated well Level of Consciousness (Post- Awake  and Alert procedure): Post Debridement Measurements of Total Wound Length: (cm) 0.2 Width: (cm) 0.2 Depth: (cm) 0.1 Volume: (cm) 0.003 Character of Wound/Ulcer Post Debridement: Improved Severity of Tissue Post Debridement: Fat layer exposed Post Procedure Diagnosis Same as Pre-procedure Electronic Signature(s) Signed: 01/22/2021 6:34:46 PM By: [name] RN, BSN Signed: 01/22/2021 6:55:53 PM By: Worthy Keeler PA-C Entered By: [name] on 01/22/2021 16:40:31 -------------------------------------------------------------------------------- HPI Details Patient Name: Date of Service: [name] [name] RBA RA E. 01/22/2021 3:45 PM Medical Record Number: [id number] Patient Account Number: [id number] Date of Birth/Sex: Treating RN: Sep 30, 1945 (75 y.o. [name] Other Clinician: Referring Provider: Treating Provider/Extender: Adele Schilder in Treatment: 2 History of Present Illness HPI Description: 01/17/2020 upon evaluation today patient presents for initial evaluation here in our clinic concerning issues she has been having with a left medial/plantar foot ulcer. This is actually been an issue for her since October 2020. She has been seeing Dr. Doran Durand for quite some time during that course. Fortunately there is no signs of active infection at this time. Or least no mention of this to have seen in general. With that being said unfortunately I do see some signs of erythema noted today that does have me concerned about the possibility of infection at this point in the surrounding area of the wound. There is also a warm to touch at the site which is somewhat concerning. Fortunately there is no evidence of systemic infection which is great news. The patient does have a history of diabetes mellitus type 2, Charcot foot which is what led to the wound, and hypertension. She notes that she was in a cast for some time with Dr. Doran Durand  for about 8 weeks. During that time they were utilizing according to the patient silver nitrate along with a foam doughnut and then Coban to secure in place in the cast in place. With that being said I do not have the actual records to review  we are going to try to get a hold of those unfortunately they would not flow over into care everywhere I did look today. She has been seeing Dr. Doran Durand and his physician assistant Larkin Ina up until the end of May and apparently is still seeing them on a regular basis every 2 weeks roughly. She has also tried Iodosorb without effect here. 01/24/2020 upon evaluation today patient actually appears to be doing quite well with regard to her wounds. She has been tolerating the dressing changes without complication. Fortunately there is no signs of active infection spreading which is good news. Her culture did show signs of Staph aureus I did place her on Augmentin due to the erythema surrounding the wound. With that being said the wound does appear to be doing better she has her longer walking cast/boot and I think that is actually good for her for the time being. I am considering reinitiating total contact cast when she gets back from vacation but next week she will actually be out of town at [employer] she knows not to get in the water but she still obviously is planning to enjoy herself she is going to take it easy on her foot however. 02/07/2020 upon evaluation today patient appears to be doing fairly well in regard to her ulcer on her foot. Fortunately there is no signs of severe infection at this time which is great news and overall very pleased in that regard. With that being said I do think that she could still benefit from a total contact cast. Nonetheless she is using her walking boot which at least provide some protection and that it prevents some of the friction occurring when she is ambulating. 02/14/2020 upon evaluation today patient appears to be doing well with  regard to her foot ulcer. This is actually measuring a little bit smaller yet again this week. Overall very pleased with where things stand and I do not see any signs of active infection at this time which is also good news. Since she is measuring better the patient has wanting to somewhat hold off on proceeding with the total contact cast which I think is reasonable at this point. 02/28/2020 on evaluation today patient appears to be doing well in general in regard to her wound although she has a lot of callus buildup as compared to last time I saw her. This is can require sharp debridement today. I do believe she really needs the total contact cast as well which we have discussed previous. 7/23; patient comes in for a total contact cast change 03/06/2020 on evaluation today patient appears to be doing quite well with regard to her wounds. Fortunately the wound bed is measuring smaller and looking much better there is little callus noted although there is some debridement necessary today. 03/13/2020 on evaluation today patient's wound actually appears to be doing excellent which is great news. With that being said unfortunately she is having some issues currently with her left leg where she does have cellulitis it appears. This may have come from an area that rubbed underneath the cast from last week that we noted we padded that area and it looks to be doing excellent at this point but nonetheless the leg was somewhat painful, swollen, and somewhat erythematous. She also had an elevated white blood cell count of 11.5 based on what I saw on looking at her records from the med center in [facility] from where she was seen yesterday. Unfortunately with [place] having a  provider on vacation there was no one here in the clinic in the afternoon when she called therefore she went to the ER as advised. Subsequently they did not cut off the cast as they did not have anyone from orthopedics there to do so and subsequently  also did not have the ability to do the Doppler for evaluation of DVT They recommended therefore given her dose of Eliquis as well as . Augmentin and sent her home to come see [place] today to have the cast taken off and then she is supposed to go back to have the study for DVT performed they are following. 03/20/2020 upon evaluation today patient appears to be doing well with regard to her foot all things considered we have not been able to use the total contact cast due to the infection that she had last week. She has been on the doxycycline and she had a 10-day supply of that I do believe that is helping and her leg appears to be doing better. With that being said there is fortunately no signs of active infection systemically at this time which is good news. No fevers, chills, nausea, vomiting, or diarrhea. 03/27/2020 upon evaluation today patient appears to be doing well with regard to her foot ulcer. There does not appear to be signs of active infection which is great news. Overall I am very pleased with where things stand at this point. 04/03/2020 upon evaluation today patient appears to be doing pretty well in regard to the overall appearance of her wound. Fortunately there is no signs of active infection at this time which is great news. No fevers, chills, nausea, vomiting, or diarrhea. With that being said she does have some blue-green drainage that actually is a little bit concerning to me for the possibility of Pseudomonas. I discussed that with the patient today. With that being said I do believe that we may be able to manage this however with the topical antibiotic cream as opposed to having to do anything oral especially since she seems to be doing so well with overall appearance of the wound. 04/10/2020 on evaluation today patient appears to be doing about the same roughly in regard to the overall size of her wound. With that being said she fortunately has not shown any signs of worsening  overall which is good news. I do believe that she is doing a great job trying to offload but again she may still do better with the cast. I do not see in the blue-green drainage that we noticed previously I do believe the gentamicin help in this regard. 04/17/2020 on evaluation today patient's wound appears to be doing about the same at this point. There is no significant improvement at this point. No fever chills noted. She is up for put the cast back on the day. That she states in a couple weeks she will need to have this off to go to a workshop. 04/24/2020 on evaluation today patient appears to be doing significantly better in regard to her wound. Fortunately there is no signs of active infection and overall feel like she is making great progress the cast seems to have done excellent for her. 05/01/2020 upon evaluation today patient presents for reevaluation she really does not appear to be doing too badly in regard to the actual wound on the left foot we have been managing. Unfortunately she has bilateral lower extremity edema with blisters between the webspace of her first and second toe on both feet. She has  a tremendous amount of edema in the legs which I think is where this is coming from it does not appear to be infected but nonetheless I do believe this is can be something that needs to be addressed today. Obviously this means we probably will not be putting the cast on at this point. She attributes this to the fact that she was sitting with her feet on the floor much longer during a conference last week she had a great time but unfortunately had a lot of complications as a result. 05/08/2020 upon evaluation today patient appears to be doing somewhat better in regard to her wounds at this time. Fortunately there is no signs of active infection which is great news. With that being said I do believe that the blisters have ruptured and unfortunately did not just reattach I will remove some of  the blistered tissue today. With that being said I do think the wound itself on the plantar aspect of left foot does need to have sharp debridement. 05/15/2020 upon evaluation today patient appears to be doing about the same in regard to her foot ulcer. Unfortunately in the past week her husband had a fall where he sustained a mild traumatic brain bleed. Fortunately he is doing better but being that he was in the hospital she had a walk on this a lot more. The wound does not appear to be any better is also not really appearing to be significantly worse which is good news. There is no signs of active infection at this time. 10/14; patient with a small diabetic wound on the medial part of her left foot. We have been using silver collagen a total contact cast making good progress. I think the patient had a series of blisters on her dorsal foot probably secondary to having her legs recumbent for 3 days while in a conference in [place]. We wrapped her leg last week these are all healed. We did not previously have her in compression on the right leg. 05/29/2020 upon evaluation today patient appears to be doing well with regard to the wound on the plantar aspect of her foot medially. This is measuring smaller and looking much better than last time I saw her. Again when I did see her last was 2 weeks back and the wound was significantly larger. I do believe the cast is helping and I believe the collagen is a good option for her. 06/05/2020 on evaluation today patient appears to be doing well with regard to her foot ulcer this is actually measuring significantly better and overall I feel like she is doing excellent. There is no signs of active infection at this time. 06/12/2020 upon evaluation today patient actually continues to show signs of good improvement which is excellent news. I am extremely pleased with how she seems to be progressing at this point in regard to her wound. There is still some depth to the  wound but I do believe the collagen is helping her quite a bit. 06/19/2020 upon evaluation today patient appears to be doing well with regard to her plantar foot ulcer. She is actually making excellent progress and in fact this appears to be almost completely healed. With that being said I do believe that the patient is going to actually require 1 more week in the cast although after that I am hopeful she will be ready for discharge. 06/26/2020 on evaluation today patient appears to be doing well in regard to her wound currently. Fortunately there is no  signs of active infection in general I feel like she is doing excellent. This appears to be completely healed I think she is ready to come out of the cast. 11/29; patient comes back in the clinic today with a very quick reopening in the exact same area on the medial plantar foot. She had been healed out last time. She went back into some new balance shoes that she got at [employer] with a custom insert. As it turns out this wound also happened when wearing these shoes although there was some modification made I think with the wound initially happened they added some foam around the wound area. This obviously is not going to be sufficient. 07/17/2020 on evaluation today patient appears to be doing well with regard to her wound. Fortunately she seems to be making good progress. Unfortunately she was in the hospital due to a issue with colitis and had just been discharged today in fact. She tells me that her biggest concern here is that a lot of her numbers especially her creatinine were somewhat elevated and problematic. She is can be following up with her provider in order to have a further work-up at this time. With that being said she did need to come back in for a cast change. She did not allow them to remove the cast due to the fact that she did not feel like that was the issue whatsoever and indeed it does not appear that was the case her wound appears  to be doing excellent today. 07/24/2020 upon evaluation today patient appears to be doing well in regard to her foot ulcer. She still has a small opening but this is showing signs of excellent improvement overall but they were very close to complete resolution. No fevers, chills, nausea, vomiting, or diarrhea. 07/31/2020 upon evaluation today patient actually appears to be doing excellent she is actually completely healed this is great news. Fortunately there is no signs of active infection at this time. No fevers, chills, nausea, vomiting, or diarrhea. The patient tells me that she did see Dr. Paulla Dolly in order to get to [place] so that she can have a custom shoe made. She saw him earlier last week. She does have an appointment with Liliane Channel on the sixth I believe on January she tells me Readmission: 08/28/2020 on evaluation today patient appears to be doing poorly in regard to her wound currently. She tells me this has reopened. The good news that she did get measured for and actually her shoes are on the way in from [facility].. With that being said she has been trying to stay off of this is much as possible using her wheelchair around home. Nonetheless has been somewhat difficult. The good news is her shoes should be here next week 09/04/2020 upon evaluation today patient appears to be doing well with regard to her wound. There is a little bit of callus buildup but nothing too significant she does tell me she was very active this week. Fortunately there is no signs of systemic infection at this point. The dorsal foot wound actually appears to be doing better. I think this is healed. 09/11/2020 upon evaluation today patient appears to actually be doing quite well in my opinion based on what I am seeing today. Fortunately there is no signs of infection in fact her mother is certain that this is even open any longer based on what I see. Fortunately I think the patient has been doing everything she can to try  to keep this under control. 09/18/2020 upon evaluation today patient appears to be doing about the same in regard to her foot ulcer. She has been tolerating the dressing changes without complication. Fortunately there is no sign of active infection at this time. No fevers, chills, nausea, vomiting, or diarrhea. She is going require some sharp debridement today. 09/25/2020 upon evaluation today patient appears to be doing well with regard to his wounds she has been tolerating the dressing changes without complication. Her wound appears to be completely healed based on what I am seeing at this point. There does not appear to be any signs of active infection at this time which is great news. No fevers, chills, nausea, vomiting, or diarrhea. She did get the cushion for her foot as well that she ordered to try to help keep pressure off of this area. That looks like it may be very beneficial for her to be honest. 10/02/2020 upon evaluation today patient appears to be doing more poorly in regard to her foot ulcer. Unfortunately this has reopened since we saw her last week. It apparently did not take too long at all for this to happen. She is obviously somewhat disappointed as she was hopeful that that would be time this thing would stay closed. Fortunately there is no evidence of active infection at this point. No fevers, chills, nausea, vomiting, or diarrhea. With that being said I am good have to perform a little bit of sharp debridement clear away some of the debris currently. Including a minimal amount of callus at this time. 10/16/2020 upon evaluation today patient appears to be doing about the same in regard to her foot ulcer unfortunately. There does not appear to be any signs of active infection which is great news. No fever chills noted she has been tolerating the dressing changes without complication which is great news. Unfortunately she tells me that she has been very depressed about the situation is  getting very frustrating to her that she continues to have issues despite everything that she is trying to do to stay off of her foot. She is extremely discouraged and I do hate to hear this. Obviously we have been hopeful that if she got her shoes that would make a difference she is actually can be getting those tomorrow but I am not certain that that alone is good to be enough to get this to heal. We may end up having to consider going back into a total contact cast to get this to heal and then subsequently once we get her healed get her into her new diabetic shoes which will and my hope anyway keep this from reopening again. 10/23/2020 upon evaluation today patient appears to be doing a little worse both in regard to the size of her wound as well as in regard to the fact that she has erythema surrounding the wound and wrap around the lateral part of her foot where she tells me has been somewhat sore. Fortunately there does not appear to be any evidence of infection systemically but locally definitely there is erythema and warmth consistent with local cellulitis. I am glad to remove some of the callus as well. 10/30/2020 upon evaluation today patient appears to be doing well with regard to her wound on the plantar aspect of her foot. This is significantly improved compared to last week. 10/30/2020 upon evaluation today patient actually is making excellent progress her wound appears to show signs of great improvement which is wonderful and that she  is extremely pleased to hear this. She actually leaves Sunday to go on her trip with her half sister and friends. 11/13/2020 on evaluation today patient's wound actually appears to be doing quite well. There does not appear to be any signs of infection it has been 2 weeks since have seen her she does have a little bit of callus buildup here today but at the same time I do not believe the vacation time set her back any just has not really made a lot of  significant improvement. She is done with her antibiotics at this point. 11/20/2020 upon evaluation today patient appears to be doing poorly in regard to her foot. In fact this appears to be showing signs of Infection. She has erythema and warmth that is concerning. I know she is very discouraged as this seems to be a recurrent issue. I think we may need to delve further into the possibility of something deeper going on here as far as a structural infection. 11/27/2020 patient presents for 1 week follow-up. She had a culture of her left foot ulcer that grew staph aureus sensitive to Augmentin. This was called in by [place], [place] this morning. She is currently on doxycycline for previous culture result. Patient also had an x-ray done of her left foot that had conflicting results. We asked for clarification and was told we would have clarification on Monday. Patient states overall she is doing well. She tries to stay off of her foot is much as possible. 12/04/2020 patient presents for 1 week follow-up. She is currently taking Augmentin and tolerating this well. It was confirmed that her x-ray had no acute osseous abnormalities. We switched her to collagen from silver alginate last week. Patient is overall doing well and trying to stay off her foot is much as possible. She has no complaints today. 12/11/2020 upon evaluation today patient appears to be doing well with regard to her wound. She has been tolerating the dressing changes without complication. Fortunately there is no signs of active infection noted at this point. I think she is definitely ready to go back into the cast. 12/18/2020 upon evaluation today patient appears to be doing well with regard to her wound. She has been tolerating the dressing changes without complication. Fortunately there is no signs of active infection at this time. No fevers, chills, nausea, vomiting, or diarrhea. 12/25/2020 upon evaluation today patient appears to be doing well in  general in regard to her plantar foot ulcer. With that being said she tells me currently that there does not appear to be any signs of pain or problems which is great in fact the wound appears to be almost completely closed which is also excellent news. I do not see any signs of infection which is great news as well. No fevers, chills, nausea, vomiting, or diarrhea. 5/25; patient presents for 1 week follow-up. She has had the cast in place for the past week and has tolerated this well. She states she is getting orthotics on 5/10. She currently denies signs of infection and has no issues today. 01/08/2021 upon evaluation today patient appears unfortunately to be present today with issues that she has been having with her plantar foot wound. This apparently has reoccurred unfortunately and is causing quite a bit of discomfort and problems for her. There does not appear to be any signs of active infection which is great news and overall I am very pleased with where things stand in that regard but nonetheless I think she  is definitely worried about where this can lead due to past history. She was just taken out of the cast and marked is healed last week. 01/15/2021 upon evaluation today patient unfortunately appears to be doing well but nonetheless continues to have issues with pressure and friction to her foot whenever she is not wearing the cast. Again today I think the wound is significantly improved and almost completely healed in 1 week's time after debridement last week. Nonetheless this has continued to be an issue for that reason we did do a MRI with contrast which really revealed no evidence whatsoever of an issue here. Again this is great but nonetheless still offers up the question what regularly to keep this from continuing to just reopen every time that we see. Again I really do not know exactly what to do is I feel like this falls on the ground outside of wound care more into a issue more  specifically with the orthopedic side of things. I understand the concern that a brace would probably be problematic for the patient. That makes complete sense to be honest. It would probably cause rubbing on other areas which would not be good. With that being said also I am not sure what else we can do other than just keeping her in the cast which is really not a feasible option either. Every time I take her out of the cast this reopens almost immediately. 01/22/2021 upon evaluation today patient appears to be doing well with regard to her foot ulcer. This is going to require little bit of sharp debridement today. Fortunately there does not appear to be any signs of active infection at this time. No fevers, chills, nausea, vomiting, or diarrhea. I did previously last week tell the patient that I would contact Dr. Nona Dell office I did do so I spoke with Larkin Ina who is the physician assistant for Dr. Doran Durand. Subsequently he did relate to me that they really did not feel like there was anything that could be done differently for the patient. That is from an orthopedic standpoint. He said that the patient could make an appoint with Dr. Doran Durand to discuss further but he was not really sure that would make a big difference in the treatment plan to be honest. Subsequently he further did recommend that potentially the patient after I offered a thought on the potential for a Crow boot could benefit from this since she seems to do well with a total contact cast. With that being said he did advise that Ravia would probably be the way to go for that if need be. Electronic Signature(s) Signed: 01/22/2021 6:36:26 PM By: Worthy Keeler PA-C Entered By: Worthy Keeler on 01/22/2021 18:36:26 -------------------------------------------------------------------------------- Physical Exam Details Patient Name: Date of Service: [name] [name], [name] RA E. 01/22/2021 3:45 PM Medical Record Number: [id number] Patient Account  Number: [id number] Date of Birth/Sex: Treating RN: Sep 26, 1945 (75 y.o. [name] Other Clinician: Referring Provider: Treating Provider/Extender: Landis Martins Weeks in Treatment: 2 Constitutional Well-nourished and well-hydrated in no acute distress. Respiratory normal breathing without difficulty. Psychiatric this patient is able to make decisions and demonstrates good insight into disease process. Alert and Oriented x 3. pleasant and cooperative. Notes Upon inspection patient's wound bed actually showed signs of good granulation and epithelization at this point. There does not appear to be any signs of active infection at this time which is great news. With that being said I did perform  debridement clear away some of the callus around the edges of the wound. She tolerated this today without complication postdebridement the really did not appear to be anything significantly open. In fact the 1 area that appeared to be open and simply was leaking there was no obvious open wound. I feel like this is very close to complete resolution but nonetheless did feel like that she would do best since there is some drainage and some obvious opening to put her in the T contact cast to get this completely closed this also gives her more otal freedom to be able to move around while we figure out the best plan going forward. Electronic Signature(s) Signed: 01/22/2021 6:39:04 PM By: Worthy Keeler PA-C Entered By: Worthy Keeler on 01/22/2021 18:39:04 -------------------------------------------------------------------------------- Physician Orders Details Patient Name: Date of Service: [name] [name], [name] [place] RBA RA E. 01/22/2021 3:45 PM Medical Record Number: [id number] Patient Account Number: [id number] Date of Birth/Sex: Treating RN: [birth year] (75 y.o. [name] Other Clinician: Referring Provider: Treating  Provider/Extender: Adele Schilder in Treatment: 2 Verbal / Phone Orders: No Diagnosis Coding ICD-10 Coding Code Description E11.621 Type 2 diabetes mellitus with foot ulcer L97.522 Non-pressure chronic ulcer of other part of left foot with fat layer exposed M14.672 Charcot's joint, left ankle and foot E11.40 Type 2 diabetes mellitus with diabetic neuropathy, unspecified Follow-up Appointments ppointment in 1 week. - allow extra time for cast Return A Bathing/ Shower/ Hygiene May shower and wash wound with soap and water. Off-Loading Total Contact Cast to Left Lower Extremity Additional Orders / Instructions Follow Nutritious Diet Wound Treatment Wound #[id number] - Foot Wound Laterality: Plantar, Left, Medial Prim Dressing: KerraCel Ag Gelling Fiber Dressing, 2x2 in (silver alginate) 1 x Per Week/7 Days ary Discharge Instructions: Apply silver alginate to wound bed as instructed Secondary Dressing: Woven Gauze Sponges 2x2 in 1 x Per Week/7 Days Discharge Instructions: Apply over primary dressing as directed. Secondary Dressing: Optifoam Non-Adhesive Dressing, 4x4 in 1 x Per Week/7 Days Discharge Instructions: Apply over primary dressing cut to form foam donut Secured With: 52M Medipore H Soft Cloth Surgical T 4 x 2 (in/yd) 1 x Per Week/7 Days ape Discharge Instructions: Secure dressing with tape as directed. Consults Podiatry - Dr. Prudy Feeler with [facility] for evaluation and treatment of Charcot foot left foot [id number] Electronic Signature(s) Signed: 01/22/2021 6:34:46 PM By: [name] RN, BSN Signed: 01/22/2021 6:55:53 PM By: Worthy Keeler PA-C Entered By: [name] on 01/22/2021 17:48:48 Prescription 01/22/2021 -------------------------------------------------------------------------------- Darral Dash PA Patient Name: Provider: [birth year] [id number] Date of Birth: NPI#Wanda Plump [id number] Sex: DEA  #: [phone] Phone #: License #: [place] Patient Address: [place], [place] [address] [address], [place] [address] [phone] Allergies bee venom protein (honey bee); Sulfa (Sulfonamide Antibiotics); Wilder Glade Provider's Orders Podiatry - Dr. Prudy Feeler with [facility] for evaluation and treatment of Charcot foot left foot [phone] Hand Signature: Date(s): Electronic Signature(s) Signed: 01/22/2021 6:34:46 PM By: [name] RN, BSN Signed: 01/22/2021 6:55:53 PM By: Worthy Keeler PA-C Entered By: [name] on 01/22/2021 17:48:49 -------------------------------------------------------------------------------- Problem List Details Patient Name: Date of Service: [name] [name] 01/22/2021 3:45 PM Medical Record Number: [id number] Patient Account Number: [id number] Date of Birth/Sex: Treating RN: Oct 19, 1945 (75 y.o. [name] Other Clinician: Referring  Provider: Treating Provider/Extender: Landis Martins Weeks in Treatment: 2 Active Problems ICD-10 Encounter Code Description Active Date MDM Diagnosis E11.621 Type 2 diabetes mellitus with foot ulcer 01/08/2021 No Yes L97.522 Non-pressure chronic ulcer of other part of left foot with fat layer exposed 01/08/2021 No Yes M14.672 Charcot's joint, left ankle and foot 01/08/2021 No Yes E11.40 Type 2 diabetes mellitus with diabetic neuropathy, unspecified 01/08/2021 No Yes Inactive Problems Resolved Problems Electronic Signature(s) Signed: 01/22/2021 4:32:22 PM By: Worthy Keeler PA-C Entered By: Worthy Keeler on 01/22/2021 16:32:21 -------------------------------------------------------------------------------- Progress Note Details Patient Name: Date of Service: [name] [name], [name] RA E. 01/22/2021 3:45 PM Medical Record Number: [id number] Patient Account Number:  [id number] Date of Birth/Sex: Treating RN: [birth year] (75 y.o. [name] Other Clinician: Referring Provider: Treating Provider/Extender: Adele Schilder in Treatment: 2 Subjective Chief Complaint Information obtained from Patient Left foot ulcer History of Present Illness (HPI) 01/17/2020 upon evaluation today patient presents for initial evaluation here in our clinic concerning issues she has been having with a left medial/plantar foot ulcer. This is actually been an issue for her since October 2020. She has been seeing Dr. Doran Durand for quite some time during that course. Fortunately there is no signs of active infection at this time. Or least no mention of this to have seen in general. With that being said unfortunately I do see some signs of erythema noted today that does have me concerned about the possibility of infection at this point in the surrounding area of the wound. There is also a warm to touch at the site which is somewhat concerning. Fortunately there is no evidence of systemic infection which is great news. The patient does have a history of diabetes mellitus type 2, Charcot foot which is what led to the wound, and hypertension. She notes that she was in a cast for some time with Dr. Doran Durand for about 8 weeks. During that time they were utilizing according to the patient silver nitrate along with a foam doughnut and then Coban to secure in place in the cast in place. With that being said I do not have the actual records to review we are going to try to get a hold of those unfortunately they would not flow over into care everywhere I did look today. She has been seeing Dr. Doran Durand and his physician assistant Larkin Ina up until the end of May and apparently is still seeing them on a regular basis every 2 weeks roughly. She has also tried Iodosorb without effect here. 01/24/2020 upon evaluation today patient actually appears to  be doing quite well with regard to her wounds. She has been tolerating the dressing changes without complication. Fortunately there is no signs of active infection spreading which is good news. Her culture did show signs of Staph aureus I did place her on Augmentin due to the erythema surrounding the wound. With that being said the wound does appear to be doing better she has her longer walking cast/boot and I think that is actually good for her for the time being. I am considering reinitiating total contact cast when she gets back from vacation but next week she will actually be out of town at [employer] she knows not to get in the water but she still obviously is planning to enjoy herself she is going to take it easy on her foot however. 02/07/2020 upon evaluation today patient appears to be doing fairly well  in regard to her ulcer on her foot. Fortunately there is no signs of severe infection at this time which is great news and overall very pleased in that regard. With that being said I do think that she could still benefit from a total contact cast. Nonetheless she is using her walking boot which at least provide some protection and that it prevents some of the friction occurring when she is ambulating. 02/14/2020 upon evaluation today patient appears to be doing well with regard to her foot ulcer. This is actually measuring a little bit smaller yet again this week. Overall very pleased with where things stand and I do not see any signs of active infection at this time which is also good news. Since she is measuring better the patient has wanting to somewhat hold off on proceeding with the total contact cast which I think is reasonable at this point. 02/28/2020 on evaluation today patient appears to be doing well in general in regard to her wound although she has a lot of callus buildup as compared to last time I saw her. This is can require sharp debridement today. I do believe she really needs the  total contact cast as well which we have discussed previous. 7/23; patient comes in for a total contact cast change 03/06/2020 on evaluation today patient appears to be doing quite well with regard to her wounds. Fortunately the wound bed is measuring smaller and looking much better there is little callus noted although there is some debridement necessary today. 03/13/2020 on evaluation today patient's wound actually appears to be doing excellent which is great news. With that being said unfortunately she is having some issues currently with her left leg where she does have cellulitis it appears. This may have come from an area that rubbed underneath the cast from last week that we noted we padded that area and it looks to be doing excellent at this point but nonetheless the leg was somewhat painful, swollen, and somewhat erythematous. She also had an elevated white blood cell count of 11.5 based on what I saw on looking at her records from the med center in [facility] from where she was seen yesterday. Unfortunately with [place] having a provider on vacation there was no one here in the clinic in the afternoon when she called therefore she went to the ER as advised. Subsequently they did not cut off the cast as they did not have anyone from orthopedics there to do so and subsequently also did not have the ability to do the Doppler for evaluation of DVT They recommended therefore given her dose of Eliquis as well as . Augmentin and sent her home to come see [place] today to have the cast taken off and then she is supposed to go back to have the study for DVT performed they are following. 03/20/2020 upon evaluation today patient appears to be doing well with regard to her foot all things considered we have not been able to use the total contact cast due to the infection that she had last week. She has been on the doxycycline and she had a 10-day supply of that I do believe that is helping and her leg appears to be  doing better. With that being said there is fortunately no signs of active infection systemically at this time which is good news. No fevers, chills, nausea, vomiting, or diarrhea. 03/27/2020 upon evaluation today patient appears to be doing well with regard to her foot ulcer. There does  not appear to be signs of active infection which is great news. Overall I am very pleased with where things stand at this point. 04/03/2020 upon evaluation today patient appears to be doing pretty well in regard to the overall appearance of her wound. Fortunately there is no signs of active infection at this time which is great news. No fevers, chills, nausea, vomiting, or diarrhea. With that being said she does have some blue-green drainage that actually is a little bit concerning to me for the possibility of Pseudomonas. I discussed that with the patient today. With that being said I do believe that we may be able to manage this however with the topical antibiotic cream as opposed to having to do anything oral especially since she seems to be doing so well with overall appearance of the wound. 04/10/2020 on evaluation today patient appears to be doing about the same roughly in regard to the overall size of her wound. With that being said she fortunately has not shown any signs of worsening overall which is good news. I do believe that she is doing a great job trying to offload but again she may still do better with the cast. I do not see in the blue-green drainage that we noticed previously I do believe the gentamicin help in this regard. 04/17/2020 on evaluation today patient's wound appears to be doing about the same at this point. There is no significant improvement at this point. No fever chills noted. She is up for put the cast back on the day. That she states in a couple weeks she will need to have this off to go to a workshop. 04/24/2020 on evaluation today patient appears to be doing significantly better in regard  to her wound. Fortunately there is no signs of active infection and overall feel like she is making great progress the cast seems to have done excellent for her. 05/01/2020 upon evaluation today patient presents for reevaluation she really does not appear to be doing too badly in regard to the actual wound on the left foot we have been managing. Unfortunately she has bilateral lower extremity edema with blisters between the webspace of her first and second toe on both feet. She has a tremendous amount of edema in the legs which I think is where this is coming from it does not appear to be infected but nonetheless I do believe this is can be something that needs to be addressed today. Obviously this means we probably will not be putting the cast on at this point. She attributes this to the fact that she was sitting with her feet on the floor much longer during a conference last week she had a great time but unfortunately had a lot of complications as a result. 05/08/2020 upon evaluation today patient appears to be doing somewhat better in regard to her wounds at this time. Fortunately there is no signs of active infection which is great news. With that being said I do believe that the blisters have ruptured and unfortunately did not just reattach I will remove some of the blistered tissue today. With that being said I do think the wound itself on the plantar aspect of left foot does need to have sharp debridement. 05/15/2020 upon evaluation today patient appears to be doing about the same in regard to her foot ulcer. Unfortunately in the past week her husband had a fall where he sustained a mild traumatic brain bleed. Fortunately he is doing better but being that he  was in the hospital she had a walk on this a lot more. The wound does not appear to be any better is also not really appearing to be significantly worse which is good news. There is no signs of active infection at this time. 10/14; patient  with a small diabetic wound on the medial part of her left foot. We have been using silver collagen a total contact cast making good progress. I think the patient had a series of blisters on her dorsal foot probably secondary to having her legs recumbent for 3 days while in a conference in [place]. We wrapped her leg last week these are all healed. We did not previously have her in compression on the right leg. 05/29/2020 upon evaluation today patient appears to be doing well with regard to the wound on the plantar aspect of her foot medially. This is measuring smaller and looking much better than last time I saw her. Again when I did see her last was 2 weeks back and the wound was significantly larger. I do believe the cast is helping and I believe the collagen is a good option for her. 06/05/2020 on evaluation today patient appears to be doing well with regard to her foot ulcer this is actually measuring significantly better and overall I feel like she is doing excellent. There is no signs of active infection at this time. 06/12/2020 upon evaluation today patient actually continues to show signs of good improvement which is excellent news. I am extremely pleased with how she seems to be progressing at this point in regard to her wound. There is still some depth to the wound but I do believe the collagen is helping her quite a bit. 06/19/2020 upon evaluation today patient appears to be doing well with regard to her plantar foot ulcer. She is actually making excellent progress and in fact this appears to be almost completely healed. With that being said I do believe that the patient is going to actually require 1 more week in the cast although after that I am hopeful she will be ready for discharge. 06/26/2020 on evaluation today patient appears to be doing well in regard to her wound currently. Fortunately there is no signs of active infection in general I feel like she is doing excellent. This appears  to be completely healed I think she is ready to come out of the cast. 11/29; patient comes back in the clinic today with a very quick reopening in the exact same area on the medial plantar foot. She had been healed out last time. She went back into some new balance shoes that she got at [employer] with a custom insert. As it turns out this wound also happened when wearing these shoes although there was some modification made I think with the wound initially happened they added some foam around the wound area. This obviously is not going to be sufficient. 07/17/2020 on evaluation today patient appears to be doing well with regard to her wound. Fortunately she seems to be making good progress. Unfortunately she was in the hospital due to a issue with colitis and had just been discharged today in fact. She tells me that her biggest concern here is that a lot of her numbers especially her creatinine were somewhat elevated and problematic. She is can be following up with her provider in order to have a further work-up at this time. With that being said she did need to come back in for a cast change. She  did not allow them to remove the cast due to the fact that she did not feel like that was the issue whatsoever and indeed it does not appear that was the case her wound appears to be doing excellent today. 07/24/2020 upon evaluation today patient appears to be doing well in regard to her foot ulcer. She still has a small opening but this is showing signs of excellent improvement overall but they were very close to complete resolution. No fevers, chills, nausea, vomiting, or diarrhea. 07/31/2020 upon evaluation today patient actually appears to be doing excellent she is actually completely healed this is great news. Fortunately there is no signs of active infection at this time. No fevers, chills, nausea, vomiting, or diarrhea. The patient tells me that she did see Dr. Paulla Dolly in order to get to [place] so that she can  have a custom shoe made. She saw him earlier last week. She does have an appointment with Liliane Channel on the sixth I believe on January she tells me Readmission: 08/28/2020 on evaluation today patient appears to be doing poorly in regard to her wound currently. She tells me this has reopened. The good news that she did get measured for and actually her shoes are on the way in from [facility].. With that being said she has been trying to stay off of this is much as possible using her wheelchair around home. Nonetheless has been somewhat difficult. The good news is her shoes should be here next week 09/04/2020 upon evaluation today patient appears to be doing well with regard to her wound. There is a little bit of callus buildup but nothing too significant she does tell me she was very active this week. Fortunately there is no signs of systemic infection at this point. The dorsal foot wound actually appears to be doing better. I think this is healed. 09/11/2020 upon evaluation today patient appears to actually be doing quite well in my opinion based on what I am seeing today. Fortunately there is no signs of infection in fact her mother is certain that this is even open any longer based on what I see. Fortunately I think the patient has been doing everything she can to try to keep this under control. 09/18/2020 upon evaluation today patient appears to be doing about the same in regard to her foot ulcer. She has been tolerating the dressing changes without complication. Fortunately there is no sign of active infection at this time. No fevers, chills, nausea, vomiting, or diarrhea. She is going require some sharp debridement today. 09/25/2020 upon evaluation today patient appears to be doing well with regard to his wounds she has been tolerating the dressing changes without complication. Her wound appears to be completely healed based on what I am seeing at this point. There does not appear to be any signs of  active infection at this time which is great news. No fevers, chills, nausea, vomiting, or diarrhea. She did get the cushion for her foot as well that she ordered to try to help keep pressure off of this area. That looks like it may be very beneficial for her to be honest. 10/02/2020 upon evaluation today patient appears to be doing more poorly in regard to her foot ulcer. Unfortunately this has reopened since we saw her last week. It apparently did not take too long at all for this to happen. She is obviously somewhat disappointed as she was hopeful that that would be time this thing would stay closed. Fortunately  there is no evidence of active infection at this point. No fevers, chills, nausea, vomiting, or diarrhea. With that being said I am good have to perform a little bit of sharp debridement clear away some of the debris currently. Including a minimal amount of callus at this time. 10/16/2020 upon evaluation today patient appears to be doing about the same in regard to her foot ulcer unfortunately. There does not appear to be any signs of active infection which is great news. No fever chills noted she has been tolerating the dressing changes without complication which is great news. Unfortunately she tells me that she has been very depressed about the situation is getting very frustrating to her that she continues to have issues despite everything that she is trying to do to stay off of her foot. She is extremely discouraged and I do hate to hear this. Obviously we have been hopeful that if she got her shoes that would make a difference she is actually can be getting those tomorrow but I am not certain that that alone is good to be enough to get this to heal. We may end up having to consider going back into a total contact cast to get this to heal and then subsequently once we get her healed get her into her new diabetic shoes which will and my hope anyway keep this from reopening again. 10/23/2020  upon evaluation today patient appears to be doing a little worse both in regard to the size of her wound as well as in regard to the fact that she has erythema surrounding the wound and wrap around the lateral part of her foot where she tells me has been somewhat sore. Fortunately there does not appear to be any evidence of infection systemically but locally definitely there is erythema and warmth consistent with local cellulitis. I am glad to remove some of the callus as well. 10/30/2020 upon evaluation today patient appears to be doing well with regard to her wound on the plantar aspect of her foot. This is significantly improved compared to last week. 10/30/2020 upon evaluation today patient actually is making excellent progress her wound appears to show signs of great improvement which is wonderful and that she is extremely pleased to hear this. She actually leaves Sunday to go on her trip with her half sister and friends. 11/13/2020 on evaluation today patient's wound actually appears to be doing quite well. There does not appear to be any signs of infection it has been 2 weeks since have seen her she does have a little bit of callus buildup here today but at the same time I do not believe the vacation time set her back any just has not really made a lot of significant improvement. She is done with her antibiotics at this point. 11/20/2020 upon evaluation today patient appears to be doing poorly in regard to her foot. In fact this appears to be showing signs of Infection. She has erythema and warmth that is concerning. I know she is very discouraged as this seems to be a recurrent issue. I think we may need to delve further into the possibility of something deeper going on here as far as a structural infection. 11/27/2020 patient presents for 1 week follow-up. She had a culture of her left foot ulcer that grew staph aureus sensitive to Augmentin. This was called in by Alfred, [place] this morning. She is  currently on doxycycline for previous culture result. Patient also had an x-ray done of her  left foot that had conflicting results. We asked for clarification and was told we would have clarification on Monday. Patient states overall she is doing well. She tries to stay off of her foot is much as possible. 12/04/2020 patient presents for 1 week follow-up. She is currently taking Augmentin and tolerating this well. It was confirmed that her x-ray had no acute osseous abnormalities. We switched her to collagen from silver alginate last week. Patient is overall doing well and trying to stay off her foot is much as possible. She has no complaints today. 12/11/2020 upon evaluation today patient appears to be doing well with regard to her wound. She has been tolerating the dressing changes without complication. Fortunately there is no signs of active infection noted at this point. I think she is definitely ready to go back into the cast. 12/18/2020 upon evaluation today patient appears to be doing well with regard to her wound. She has been tolerating the dressing changes without complication. Fortunately there is no signs of active infection at this time. No fevers, chills, nausea, vomiting, or diarrhea. 12/25/2020 upon evaluation today patient appears to be doing well in general in regard to her plantar foot ulcer. With that being said she tells me currently that there does not appear to be any signs of pain or problems which is great in fact the wound appears to be almost completely closed which is also excellent news. I do not see any signs of infection which is great news as well. No fevers, chills, nausea, vomiting, or diarrhea. 5/25; patient presents for 1 week follow-up. She has had the cast in place for the past week and has tolerated this well. She states she is getting orthotics on 5/10. She currently denies signs of infection and has no issues today. 01/08/2021 upon evaluation today patient appears  unfortunately to be present today with issues that she has been having with her plantar foot wound. This apparently has reoccurred unfortunately and is causing quite a bit of discomfort and problems for her. There does not appear to be any signs of active infection which is great news and overall I am very pleased with where things stand in that regard but nonetheless I think she is definitely worried about where this can lead due to past history. She was just taken out of the cast and marked is healed last week. 01/15/2021 upon evaluation today patient unfortunately appears to be doing well but nonetheless continues to have issues with pressure and friction to her foot whenever she is not wearing the cast. Again today I think the wound is significantly improved and almost completely healed in 1 week's time after debridement last week. Nonetheless this has continued to be an issue for that reason we did do a MRI with contrast which really revealed no evidence whatsoever of an issue here. Again this is great but nonetheless still offers up the question what regularly to keep this from continuing to just reopen every time that we see. Again I really do not know exactly what to do is I feel like this falls on the ground outside of wound care more into a issue more specifically with the orthopedic side of things. I understand the concern that a brace would probably be problematic for the patient. That makes complete sense to be honest. It would probably cause rubbing on other areas which would not be good. With that being said also I am not sure what else we can do other than just keeping  her in the cast which is really not a feasible option either. Every time I take her out of the cast this reopens almost immediately. 01/22/2021 upon evaluation today patient appears to be doing well with regard to her foot ulcer. This is going to require little bit of sharp debridement today. Fortunately there does not appear  to be any signs of active infection at this time. No fevers, chills, nausea, vomiting, or diarrhea. I did previously last week tell the patient that I would contact Dr. Nona Dell office I did do so I spoke with Larkin Ina who is the physician assistant for Dr. Doran Durand. Subsequently he did relate to me that they really did not feel like there was anything that could be done differently for the patient. That is from an orthopedic standpoint. He said that the patient could make an appoint with Dr. Doran Durand to discuss further but he was not really sure that would make a big difference in the treatment plan to be honest. Subsequently he further did recommend that potentially the patient after I offered a thought on the potential for a Crow boot could benefit from this since she seems to do well with a total contact cast. With that being said he did advise that [place] would probably be the way to go for that if need be. Objective Constitutional Well-nourished and well-hydrated in no acute distress. Vitals Time Taken: 3:51 PM, Temperature: 99.1 F, Pulse: 84 bpm, Respiratory Rate: 20 breaths/min, Blood Pressure: 143/83 mmHg. Respiratory normal breathing without difficulty. Psychiatric this patient is able to make decisions and demonstrates good insight into disease process. Alert and Oriented x 3. pleasant and cooperative. General Notes: Upon inspection patient's wound bed actually showed signs of good granulation and epithelization at this point. There does not appear to be any signs of active infection at this time which is great news. With that being said I did perform debridement clear away some of the callus around the edges of the wound. She tolerated this today without complication postdebridement the really did not appear to be anything significantly open. In fact the 1 area that appeared to be open and simply was leaking there was no obvious open wound. I feel like this is very close to complete  resolution but nonetheless did feel like that she would do best since there is some drainage and some obvious opening to put her in the T contact cast to get this completely closed this also gives otal her more freedom to be able to move around while we figure out the best plan going forward. Integumentary (Hair, Skin) Wound #1RRRR status is Open. Original cause of wound was Gradually Appeared. The date acquired was: 05/11/2019. The wound has been in treatment 53 weeks. The wound is located on the [place]. The wound measures 0.2cm length x 0.2cm width x 0.1cm depth; 0.031cm^2 area and 0.003cm^3 volume. There is Fat Layer (Subcutaneous Tissue) exposed. There is no tunneling or undermining noted. There is a medium amount of serosanguineous drainage noted. The wound margin is distinct with the outline attached to the wound base. There is large (67-100%) red, pink granulation within the wound bed. There is no necrotic tissue within the wound bed. Assessment Active Problems ICD-10 Type 2 diabetes mellitus with foot ulcer Non-pressure chronic ulcer of other part of left foot with fat layer exposed Charcot's joint, left ankle and foot Type 2 diabetes mellitus with diabetic neuropathy, unspecified Procedures Wound #1RRRR Pre-procedure diagnosis of Wound #1RRRR is a Diabetic Wound/Ulcer of  the Lower Extremity located on the [place] .Severity of Tissue Pre Debridement is: Fat layer exposed. There was a Selective/Open Wound Skin/Epidermis Debridement with a total area of 0.25 sq cm performed by Worthy Keeler, PA. With the following instrument(s): Curette to remove Non-Viable tissue/material. Material removed includes Callus and Skin: Epidermis and after achieving pain control using Lidocaine 4% Topical Solution. No specimens were taken. A time out was conducted at 16:35, prior to the start of the procedure. A Minimum amount of bleeding was controlled with Pressure. The  procedure was tolerated well with a pain level of 0 throughout and a pain level of 0 following the procedure. Post Debridement Measurements: 0.2cm length x 0.2cm width x 0.1cm depth; 0.003cm^3 volume. Character of Wound/Ulcer Post Debridement is improved. Severity of Tissue Post Debridement is: Fat layer exposed. Post procedure Diagnosis Wound #1RRRR: Same as Pre-Procedure Pre-procedure diagnosis of Wound #1RRRR is a Diabetic Wound/Ulcer of the Lower Extremity located on the Left,Medial,Plantar Foot . There was a Total Contact Cast Procedure by Worthy Keeler, PA. Post procedure Diagnosis Wound #1RRRR: Same as Pre-Procedure Plan Follow-up Appointments: Return Appointment in 1 week. - allow extra time for cast Bathing/ Shower/ Hygiene: May shower and wash wound with soap and water. Off-Loading: T Contact Cast to Left Lower Extremity otal Additional Orders / Instructions: Follow Nutritious Diet Consults ordered were: Podiatry - Dr. Prudy Feeler with [facility] for evaluation and treatment of Charcot foot left foot [id number] WOUND #1RRRR: - Foot Wound Laterality: Plantar, Left, Medial Prim Dressing: KerraCel Ag Gelling Fiber Dressing, 2x2 in (silver alginate) 1 x Per Week/7 Days ary Discharge Instructions: Apply silver alginate to wound bed as instructed Secondary Dressing: Woven Gauze Sponges 2x2 in 1 x Per Week/7 Days Discharge Instructions: Apply over primary dressing as directed. Secondary Dressing: Optifoam Non-Adhesive Dressing, 4x4 in 1 x Per Week/7 Days Discharge Instructions: Apply over primary dressing cut to form foam donut Secured With: 51M Medipore H Soft Cloth Surgical T 4 x 2 (in/yd) 1 x Per Week/7 Days ape Discharge Instructions: Secure dressing with tape as directed. 1. We did go ahead and reinitiate a total contact cast today the patient tolerated the application without complication. Post application she states that everything felt okay and there did not  appear to be any significant issues here. 2. I am also can recommend that we continue with a silver alginate dressing described keep the area nice and clean. I think this is still the ideal thing and way to go. 3. The patient would like to see Dr. Prudy Feeler in [place] for a second opinion with regard to her foot and the best steps going forward. I definitely think that is an okay thing to do and I discussed that with her today. We will go ahead and make that referral. We will see patient back for reevaluation in 1 week here in the clinic. If anything worsens or changes patient will contact our office for additional recommendations. Electronic Signature(s) Signed: 01/22/2021 6:40:02 PM By: Worthy Keeler PA-C Entered By: Worthy Keeler on 01/22/2021 18:40:02 -------------------------------------------------------------------------------- Total Contact Cast Details Patient Name: Date of Service: [name] [name], [name] E. 01/22/2021 3:45 PM Medical Record Number: [id number] Patient Account Number: [id number] Date of Birth/Sex: Treating RN: 1946/07/17 (75 y.o. [name] Other Clinician: Referring Provider: Treating Provider/Extender: Landis Martins Weeks in Treatment: 2 T Contact Cast Applied for Wound Assessment: otal Wound #1RRRR Left,Medial,Plantar Foot Performed By:  Physician Worthy Keeler, [place] Post Procedure Diagnosis Same as Pre-procedure Electronic Signature(s) Signed: 01/22/2021 6:34:46 PM By: [name] RN, BSN Signed: 01/22/2021 6:55:53 PM By: Worthy Keeler PA-C Entered By: [name] on 01/22/2021 16:48:37 -------------------------------------------------------------------------------- SuperBill Details Patient Name: Date of Service: [name] [name] RBA RA E. 01/22/2021 Medical Record Number: [id number] Patient Account Number: [id number] Date of Birth/Sex: Treating RN: [birth year] (75 y.o. [name] Primary  Care Provider: [name] Other Clinician: Referring Provider: Treating Provider/Extender: Landis Martins Weeks in Treatment: 2 Diagnosis Coding ICD-10 Codes Code Description [id number] Type 2 diabetes mellitus with foot ulcer L97.522 Non-pressure chronic ulcer of other part of left foot with fat layer exposed M14.672 Charcot's joint, left ankle and foot E11.40 Type 2 diabetes mellitus with diabetic neuropathy, unspecified Facility Procedures CPT4 Code: [id number] Description: [phone] - DEBRIDE WOUND 1ST 20 SQ CM OR < ICD-10 Diagnosis Description L97.522 Non-pressure chronic ulcer of other part of left foot with fat layer exposed Modifier: Quantity: 1 Physician Procedures : CPT4 Code Description Modifier [id number] - WC PHYS LEVEL 4 - EST PT 25 ICD-10 Diagnosis Description E11.621 Type 2 diabetes mellitus with foot ulcer L97.522 Non-pressure chronic ulcer of other part of left foot with fat layer exposed M14.672  Charcot's joint, left ankle and foot E11.40 Type 2 diabetes mellitus with diabetic neuropathy, unspecified Quantity: 1 : [id number] - WC PHYS DEBR WO ANESTH 20 SQ CM ICD-10 Diagnosis Description L97.522 Non-pressure chronic ulcer of other part of left foot with fat layer exposed Quantity: 1 Electronic Signature(s) Signed: 01/22/2021 6:48:16 PM By: Worthy Keeler PA-C Previous Signature: 01/22/2021 6:34:46 PM Version By: [name] RN, BSN Entered By: Worthy Keeler on 01/22/2021 18:48:16

## 2021-01-23 ENCOUNTER — Other Ambulatory Visit: Payer: Self-pay | Admitting: Internal Medicine

## 2021-01-23 DIAGNOSIS — M1711 Unilateral primary osteoarthritis, right knee: Secondary | ICD-10-CM | POA: Diagnosis not present

## 2021-01-23 DIAGNOSIS — M1712 Unilateral primary osteoarthritis, left knee: Secondary | ICD-10-CM | POA: Diagnosis not present

## 2021-01-23 NOTE — Progress Notes (Signed)
DRENA, HAM ([id number]) Visit Report for 01/22/2021 Arrival Information Details Patient Name: Date of Service: [name] [name], [name] E. 01/22/2021 3:45 PM Medical Record Number: [id number] Patient Account Number: [id number] Date of Birth/Sex: Treating RN: [birth year] (75 y.o. [name], Vaughan Basta Primary Care Solveig Fangman: [name] Other Clinician: Referring Braniyah Besse: Treating Deamonte Sayegh/Extender: Adele Schilder in Treatment: 2 Visit Information History Since Last Visit Added or deleted any medications: No Patient Arrived: Wheel Chair Any new allergies or adverse reactions: No Arrival Time: 15:49 Had a fall or experienced change in No Accompanied By: self activities of daily living that may affect Transfer Assistance: None risk of falls: Patient Identification Verified: Yes Signs or symptoms of abuse/neglect since last visito No Secondary Verification Process Completed: Yes Hospitalized since last visit: No Patient Requires Transmission-Based Precautions: No Implantable device outside of the clinic excluding No cellular tissue based products placed in the center since last visit: Has Dressing in Place as Prescribed: Yes Pain Present Now: No Electronic Signature(s) Signed: 01/23/2021 10:52:16 AM By: Sandre Kitty Entered By: Sandre Kitty on 01/22/2021 15:51:18 -------------------------------------------------------------------------------- Encounter Discharge Information Details Patient Name: Date of Service: [name] Amato RA E. 01/22/2021 3:45 PM Medical Record Number: [id number] Patient Account Number: [id number] Date of Birth/Sex: Treating RN: 08-28-1945 (75 y.o. [name], Lauren Primary Care Karissa Meenan: [name] Other Clinician: Referring Wilder Amodei: Treating Eowyn Tabone/Extender: Adele Schilder in Treatment: 2 Encounter Discharge Information Items Post Procedure Vitals Discharge Condition: Stable Temperature (F): 99.1 Ambulatory  Status: Wheelchair Pulse (bpm): 84 Discharge Destination: Home Respiratory Rate (breaths/min): 17 Transportation: Private Auto Blood Pressure (mmHg): 143/83 Accompanied By: self Schedule Follow-up Appointment: Yes Clinical Summary of Care: Patient Declined Electronic Signature(s) Signed: 01/22/2021 6:22:58 PM By: Rhae Hammock RN Entered By: Rhae Hammock on 01/22/2021 17:02:21 -------------------------------------------------------------------------------- Lower Extremity Assessment Details Patient Name: Date of Service: [name] Amato RA E. 01/22/2021 3:45 PM Medical Record Number: [id number] Patient Account Number: [id number] Date of Birth/Sex: Treating RN: [birth year] (75 y.o. [name] Primary Care Jeremih Dearmas: [name] Other Clinician: Referring Blayden Conwell: Treating Lavada Langsam/Extender: Landis Martins Weeks in Treatment: 2 Edema Assessment Assessed: [Left: Yes] [Right: No] Edema: [Left: Ye] [Right: s] Calf Left: Right: Point of Measurement: 32 cm From Medial Instep 39 cm Ankle Left: Right: Point of Measurement: 8 cm From Medial Instep 24 cm Vascular Assessment Pulses: Dorsalis Pedis Palpable: [Left:Yes] Electronic Signature(s) Signed: 01/23/2021 5:31:56 PM By: Deon Pilling Entered By: Deon Pilling on 01/22/2021 16:20:25 -------------------------------------------------------------------------------- Multi-Disciplinary Care Plan Details Patient Name: Date of Service: [name] Amato RA E. 01/22/2021 3:45 PM Medical Record Number: [id number] Patient Account Number: [id number] Date of Birth/Sex: Treating RN: November 07, 1945 (75 y.o. [name] Primary Care Leva Baine: [name] Other Clinician: Referring Cj Edgell: Treating Dnasia Gauna/Extender: Adele Schilder in Treatment: 2 Multidisciplinary Care Plan reviewed with physician Active Inactive Wound/Skin Impairment Nursing Diagnoses: Impaired tissue integrity Knowledge  deficit related to ulceration/compromised skin integrity Goals: Patient/caregiver will verbalize understanding of skin care regimen Date Initiated: 01/08/2021 Target Resolution Date: 02/05/2021 Goal Status: Active Ulcer/skin breakdown will have a volume reduction of 30% by week 4 Date Initiated: 01/08/2021 Target Resolution Date: 02/05/2021 Goal Status: Active Interventions: Assess patient/caregiver ability to obtain necessary supplies Assess patient/caregiver ability to perform ulcer/skin care regimen upon admission and as needed Provide education on ulcer and skin care Treatment Activities: Skin care regimen initiated : 01/08/2021 Topical wound management initiated : 01/08/2021 Notes: Electronic Signature(s) Signed: 01/22/2021 6:34:46 PM By: [name] RN,  BSN Entered By: [name] on 01/22/2021 16:32:58 -------------------------------------------------------------------------------- Pain Assessment Details Patient Name: Date of Service: [name] [name], [name] E. 01/22/2021 3:45 PM Medical Record Number: [id number] Patient Account Number: [id number] Date of Birth/Sex: Treating RN: 1945/09/20 (75 y.o. [name] Primary Care Cumi Sanagustin: [name] Other Clinician: Referring Adolphe Fortunato: Treating Klaudia Beirne/Extender: Landis Martins Weeks in Treatment: 2 Active Problems Location of Pain Severity and Description of Pain Patient Has Paino No Site Locations Pain Management and Medication Current Pain Management: Electronic Signature(s) Signed: 01/22/2021 6:34:46 PM By: [name] RN, BSN Signed: 01/23/2021 10:52:16 AM By: Sandre Kitty Entered By: Sandre Kitty on 01/22/2021 15:51:42 -------------------------------------------------------------------------------- Patient/Caregiver Education Details Patient Name: Date of Service: [name] Amato RA E. 6/15/2022andnbsp3:45 PM Medical Record Number: [id number] Patient Account Number: [id number] Date of  Birth/Gender: Treating RN: 02-16-1946 (75 y.o. [name] Primary Care Physician: [name] Other Clinician: Referring Physician: Treating Physician/Extender: Adele Schilder in Treatment: 2 Education Assessment Education Provided To: Patient Education Topics Provided Offloading: Wound/Skin Impairment: Methods: Explain/Verbal Responses: Reinforcements needed, State content correctly Electronic Signature(s) Signed: 01/22/2021 6:34:46 PM By: [name] RN, BSN Entered By: [name] on 01/22/2021 16:33:24 -------------------------------------------------------------------------------- Wound Assessment Details Patient Name: Date of Service: [name] Amato RA E. 01/22/2021 3:45 PM Medical Record Number: [id number] Patient Account Number: [id number] Date of Birth/Sex: Treating RN: 1945/09/30 (75 y.o. [name] Primary Care Genesys Coggeshall: [name] Other Clinician: Referring Asuncion Tapscott: Treating Yuvia Plant/Extender: Landis Martins Weeks in Treatment: 2 Wound Status Wound Number: [id number] Primary Diabetic Wound/Ulcer of the Lower Extremity Etiology: Wound Location: Left, Medial, Plantar Foot Wound Open Wounding Event: Gradually Appeared Status: Date Acquired: 05/11/2019 Comorbid Sleep Apnea, Deep Vein Thrombosis, Hypertension, Colitis, Type Weeks Of Treatment: 53 History: II Diabetes, Gout, Osteoarthritis, Neuropathy Clustered Wound: No Photos Wound Measurements Length: (cm) 0.2 Width: (cm) 0.2 Depth: (cm) 0.1 Area: (cm) 0.031 Volume: (cm) 0.003 % Reduction in Area: 97% % Reduction in Volume: 99.3% Epithelialization: Large (67-100%) Tunneling: No Undermining: No Wound Description Classification: Grade 1 Wound Margin: Distinct, outline attached Exudate Amount: Medium Exudate Type: Serosanguineous Exudate Color: red, brown Foul Odor After Cleansing: No Slough/Fibrino No Wound Bed Granulation Amount: Large  (67-100%) Exposed Structure Granulation Quality: Red, Pink Fascia Exposed: No Necrotic Amount: None Present (0%) Fat Layer (Subcutaneous Tissue) Exposed: Yes Tendon Exposed: No Muscle Exposed: No Joint Exposed: No Bone Exposed: No Treatment Notes Wound #1RRRR (Foot) Wound Laterality: Plantar, Left, Medial Cleanser Peri-Wound Care Topical Primary Dressing KerraCel Ag Gelling Fiber Dressing, 2x2 in (silver alginate) Discharge Instruction: Apply silver alginate to wound bed as instructed Secondary Dressing Woven Gauze Sponges 2x2 in Discharge Instruction: Apply over primary dressing as directed. Optifoam Non-Adhesive Dressing, 4x4 in Discharge Instruction: Apply over primary dressing cut to form foam donut Secured With 78M Medipore H Soft Cloth Surgical T 4 x 2 (in/yd) ape Discharge Instruction: Secure dressing with tape as directed. Compression Wrap Compression Stockings Add-Ons Electronic Signature(s) Signed: 01/23/2021 5:21:02 PM By: Sandre Kitty Signed: 01/23/2021 5:31:32 PM By: [name] RN, BSN Previous Signature: 01/22/2021 6:34:46 PM Version By: [name] RN, BSN Entered By: Sandre Kitty on 01/23/2021 16:40:16 -------------------------------------------------------------------------------- Vitals Details Patient Name: Date of Service: [name] [name], [name] RA E. 01/22/2021 3:45 PM Medical Record Number: [id number] Patient Account Number: [id number] Date of Birth/Sex: Treating RN: September 23, 1945 (75 y.o. [name] Primary Care Issis Lindseth: [name] Other Clinician: Referring Amauris Debois: Treating Julita Ozbun/Extender: Landis Martins Weeks in Treatment: 2 Vital Signs  Time Taken: 15:51 Temperature (F): 99.1 Pulse (bpm): 84 Respiratory Rate (breaths/min): 20 Blood Pressure (mmHg): 143/83 Reference Range: 80 - 120 mg / dl Electronic Signature(s) Signed: 01/23/2021 10:52:16 AM By: Sandre Kitty Entered By: Sandre Kitty on 01/22/2021  15:51:35

## 2021-01-27 ENCOUNTER — Encounter: Payer: Self-pay | Admitting: Internal Medicine

## 2021-01-27 ENCOUNTER — Other Ambulatory Visit: Payer: Self-pay | Admitting: Internal Medicine

## 2021-01-29 ENCOUNTER — Encounter (HOSPITAL_BASED_OUTPATIENT_CLINIC_OR_DEPARTMENT_OTHER): Payer: Medicare Other | Admitting: Physician Assistant

## 2021-01-29 ENCOUNTER — Other Ambulatory Visit: Payer: Self-pay

## 2021-01-29 DIAGNOSIS — L97522 Non-pressure chronic ulcer of other part of left foot with fat layer exposed: Secondary | ICD-10-CM | POA: Diagnosis not present

## 2021-01-29 DIAGNOSIS — E1140 Type 2 diabetes mellitus with diabetic neuropathy, unspecified: Secondary | ICD-10-CM | POA: Diagnosis not present

## 2021-01-29 DIAGNOSIS — E11621 Type 2 diabetes mellitus with foot ulcer: Secondary | ICD-10-CM | POA: Diagnosis not present

## 2021-01-29 DIAGNOSIS — E1151 Type 2 diabetes mellitus with diabetic peripheral angiopathy without gangrene: Secondary | ICD-10-CM | POA: Diagnosis not present

## 2021-01-29 NOTE — Progress Notes (Signed)
LEANER, MORICI ([id number]) Visit Report for 01/29/2021 Arrival Information Details Patient Name: Date of Service: NOVIA, LANSBERRY RA E. 01/29/2021 3:30 PM Medical Record Number: [id number] Patient Account Number: [id number] Date of Birth/Sex: Treating RN: Sep 14, 1945 (75 y.o. [name] Primary Care Bailen Geffre: [name] Other Clinician: Referring Alegria Dominique: Treating Claudis Giovanelli/Extender: Adele Schilder in Treatment: 3 Visit Information History Since Last Visit All ordered tests and consults were completed: No Patient Arrived: Wheel Chair Added or deleted any medications: No Arrival Time: 14:58 Any new allergies or adverse reactions: No Accompanied By: self Had a fall or experienced change in No Transfer Assistance: None activities of daily living that may affect Patient Identification Verified: Yes risk of falls: Secondary Verification Process Completed: Yes Signs or symptoms of abuse/neglect since last visito No Patient Requires Transmission-Based Precautions: No Hospitalized since last visit: No Patient Has Alerts: No Implantable device outside of the clinic excluding No cellular tissue based products placed in the center since last visit: Has Dressing in Place as Prescribed: Yes Has Footwear/Offloading in Place as Prescribed: Yes Left: T Contact Cast otal Pain Present Now: No Notes Per patient does not have an appt with Dr. Mena Goes. Per patient no one has called her with an appt. Electronic Signature(s) Signed: 01/29/2021 5:52:09 PM By: Deon Pilling Entered By: Deon Pilling on 01/29/2021 15:08:53 -------------------------------------------------------------------------------- Clinic Level of Care Assessment Details Patient Name: Date of Service: JAKIERA, EHLER RA E. 01/29/2021 3:30 PM Medical Record Number: [id number] Patient Account Number: [id number] Date of Birth/Sex: Treating RN: November 11, 1945 (75 y.o. [name] Primary Care Leticia Mcdiarmid: [name] Other Clinician: Referring Lenka Zhao: Treating Marcey Persad/Extender: Adele Schilder in Treatment: 3 Clinic Level of Care Assessment Items TOOL 4 Quantity Score []  - 0 Use when only an EandM is performed on FOLLOW-UP visit ASSESSMENTS - Nursing Assessment / Reassessment X- 1 10 Reassessment of Co-morbidities (includes updates in patient status) X- 1 5 Reassessment of Adherence to Treatment Plan ASSESSMENTS - Wound and Skin A ssessment / Reassessment X - Simple Wound Assessment / Reassessment - one wound 1 5 []  - 0 Complex Wound Assessment / Reassessment - multiple wounds []  - 0 Dermatologic / Skin Assessment (not related to wound area) ASSESSMENTS - Focused Assessment []  - 0 Circumferential Edema Measurements - multi extremities []  - 0 Nutritional Assessment / Counseling / Intervention X- 1 5 Lower Extremity Assessment (monofilament, tuning fork, pulses) []  - 0 Peripheral Arterial Disease Assessment (using hand held doppler) ASSESSMENTS - Ostomy and/or Continence Assessment and Care []  - 0 Incontinence Assessment and Management []  - 0 Ostomy Care Assessment and Management (repouching, etc.) PROCESS - Coordination of Care X - Simple Patient / Family Education for ongoing care 1 15 []  - 0 Complex (extensive) Patient / Family Education for ongoing care X- 1 10 Staff obtains Programmer, systems, Records, T Results / Process Orders est []  - 0 Staff telephones HHA, Nursing Homes / Clarify orders / etc []  - 0 Routine Transfer to another Facility (non-emergent condition) []  - 0 Routine Hospital Admission (non-emergent condition) []  - 0 New Admissions / Biomedical engineer / Ordering NPWT Apligraf, etc. , []  - 0 Emergency Hospital Admission (emergent condition) X- 1 10 Simple Discharge Coordination []  - 0 Complex (extensive) Discharge Coordination PROCESS - Special Needs []  - 0 Pediatric / Minor Patient Management []  - 0 Isolation Patient  Management []  - 0 Hearing / Language / Visual special needs []  - 0 Assessment of Community assistance (transportation, D/C planning,  etc.) []  - 0 Additional assistance / Altered mentation []  - 0 Support Surface(s) Assessment (bed, cushion, seat, etc.) INTERVENTIONS - Wound Cleansing / Measurement X - Simple Wound Cleansing - one wound 1 5 []  - 0 Complex Wound Cleansing - multiple wounds X- 1 5 Wound Imaging (photographs - any number of wounds) []  - 0 Wound Tracing (instead of photographs) X- 1 5 Simple Wound Measurement - one wound []  - 0 Complex Wound Measurement - multiple wounds INTERVENTIONS - Wound Dressings X - Small Wound Dressing one or multiple wounds 1 10 []  - 0 Medium Wound Dressing one or multiple wounds []  - 0 Large Wound Dressing one or multiple wounds []  - 0 Application of Medications - topical []  - 0 Application of Medications - injection INTERVENTIONS - Miscellaneous []  - 0 External ear exam []  - 0 Specimen Collection (cultures, biopsies, blood, body fluids, etc.) []  - 0 Specimen(s) / Culture(s) sent or taken to Lab for analysis []  - 0 Patient Transfer (multiple staff / Civil Service fast streamer / Similar devices) []  - 0 Simple Staple / Suture removal (25 or less) []  - 0 Complex Staple / Suture removal (26 or more) []  - 0 Hypo / Hyperglycemic Management (close monitor of Blood Glucose) []  - 0 Ankle / Brachial Index (ABI) - do not check if billed separately X- 1 5 Vital Signs Has the patient been seen at the hospital within the last three years: Yes Total Score: 90 Level Of Care: New/Established - Level 3 Electronic Signature(s) Signed: 01/29/2021 5:44:00 PM By: [name] RN, BSN Entered By: [name] on 01/29/2021 15:43:37 -------------------------------------------------------------------------------- Encounter Discharge Information Details Patient Name: Date of Service: [name] 01/29/2021 3:30 PM Medical Record Number:  [id number] Patient Account Number: [id number] Date of Birth/Sex: Treating RN: 11/04/1945 (75 y.o. [name] Primary Care Chizaram Latino: [name] Other Clinician: Referring Zain Lankford: Treating Erasmo Vertz/Extender: Adele Schilder in Treatment: 3 Encounter Discharge Information Items Discharge Condition: Stable Ambulatory Status: Wheelchair Discharge Destination: Home Transportation: Private Auto Accompanied By: self Schedule Follow-up Appointment: Yes Clinical Summary of Care: Patient Declined Notes Temperature recheck 99.4. Electronic Signature(s) Signed: 01/29/2021 5:52:09 PM By: Deon Pilling Previous Signature: 01/29/2021 5:44:00 PM Version By: [name] RN, BSN Entered By: Deon Pilling on 01/29/2021 17:51:40 -------------------------------------------------------------------------------- Lower Extremity Assessment Details Patient Name: Date of Service: [name] 01/29/2021 3:30 PM Medical Record Number: [id number] Patient Account Number: [id number] Date of Birth/Sex: Treating RN: [birth year] (75 y.o. [name] Primary Care Taralyn Ferraiolo: [name] Other Clinician: Referring Kalese Ensz: Treating Zyra Parrillo/Extender: Landis Martins Weeks in Treatment: 3 Edema Assessment Assessed: [Left: Yes] [Right: No] Edema: [Left: N] [Right: o] Calf Left: Right: Point of Measurement: 32 cm From Medial Instep 34.5 cm Ankle Left: Right: Point of Measurement: 8 cm From Medial Instep 23 cm Vascular Assessment Pulses: Dorsalis Pedis Palpable: [Left:Yes] Electronic Signature(s) Signed: 01/29/2021 5:52:09 PM By: Deon Pilling Entered By: Deon Pilling on 01/29/2021 15:09:28 -------------------------------------------------------------------------------- Multi-Disciplinary Care Plan Details Patient Name: Date of Service: [name] 01/29/2021 3:30 PM Medical Record Number: [id number] Patient Account Number: [id number] Date of  Birth/Sex: Treating RN: May 28, 1946 (75 y.o. [name] Primary Care Markon Jares: [name] Other Clinician: Referring Yuktha Kerchner: Treating Wood Novacek/Extender: Adele Schilder in Treatment: 3 Multidisciplinary Care Plan reviewed with physician Active Inactive Wound/Skin Impairment Nursing Diagnoses: Impaired tissue integrity Knowledge deficit related to ulceration/compromised skin integrity Goals: Patient/caregiver will verbalize understanding of skin care regimen Date  Initiated: 01/08/2021 Target Resolution Date: 02/05/2021 Goal Status: Active Ulcer/skin breakdown will have a volume reduction of 30% by week 4 Date Initiated: 01/08/2021 Target Resolution Date: 02/05/2021 Goal Status: Active Interventions: Assess patient/caregiver ability to obtain necessary supplies Assess patient/caregiver ability to perform ulcer/skin care regimen upon admission and as needed Provide education on ulcer and skin care Treatment Activities: Skin care regimen initiated : 01/08/2021 Topical wound management initiated : 01/08/2021 Notes: Electronic Signature(s) Signed: 01/29/2021 5:44:00 PM By: [name] RN, BSN Entered By: [name] on 01/29/2021 15:40:07 -------------------------------------------------------------------------------- Pain Assessment Details Patient Name: Date of Service: [name] 01/29/2021 3:30 PM Medical Record Number: [id number] Patient Account Number: [id number] Date of Birth/Sex: Treating RN: 1946/07/21 (75 y.o. [name] Primary Care Woodie Degraffenreid: [name] Other Clinician: Referring Hawkin Charo: Treating Andrian Sabala/Extender: Landis Martins Weeks in Treatment: 3 Active Problems Location of Pain Severity and Description of Pain Patient Has Paino No Site Locations Rate the pain. Current Pain Level: 0 Pain Management and Medication Current Pain Management: Medication: No Cold Application: No Rest: No Massage:  No Activity: No T.E.N.S.: No Heat Application: No Leg drop or elevation: No Is the Current Pain Management Adequate: Adequate How does your wound impact your activities of daily livingo Sleep: No Bathing: No Appetite: No Relationship With Others: No Bladder Continence: No Emotions: No Bowel Continence: No Work: No Toileting: No Drive: No Dressing: No Hobbies: No Electronic Signature(s) Signed: 01/29/2021 5:52:09 PM By: Deon Pilling Entered By: Deon Pilling on 01/29/2021 15:09:18 -------------------------------------------------------------------------------- Patient/Caregiver Education Details Patient Name: Date of Service: [name] Reason 6/22/2022andnbsp3:30 PM Medical Record Number: [id number] Patient Account Number: [id number] Date of Birth/Gender: Treating RN: 03-04-1946 (75 y.o. [name] Primary Care Physician: [name] Other Clinician: Referring Physician: Treating Physician/Extender: Adele Schilder in Treatment: 3 Education Assessment Education Provided To: Patient Education Topics Provided Offloading: Methods: Explain/Verbal Responses: Reinforcements needed, State content correctly Wound/Skin Impairment: Methods: Explain/Verbal Responses: Reinforcements needed, State content correctly Electronic Signature(s) Signed: 01/29/2021 5:44:00 PM By: [name] RN, BSN Entered By: [name] on 01/29/2021 15:40:32 -------------------------------------------------------------------------------- Wound Assessment Details Patient Name: Date of Service: [name] 01/29/2021 3:30 PM Medical Record Number: [id number] Patient Account Number: [id number] Date of Birth/Sex: Treating RN: [birth year] (75 y.o. Helene Shoe, Tammi Klippel Primary Care Darnella Zeiter: [name] Other Clinician: Referring Chanan Detwiler: Treating Ladarious Kresse/Extender: Landis Martins Weeks in Treatment: 3 Wound Status Wound Number: [id number] Primary  Diabetic Wound/Ulcer of the Lower Extremity Etiology: Wound Location: Left, Medial, Plantar Foot Wound Open Wounding Event: Gradually Appeared Status: Date Acquired: 05/11/2019 Comorbid Sleep Apnea, Deep Vein Thrombosis, Hypertension, Colitis, Type Weeks Of Treatment: 54 History: II Diabetes, Gout, Osteoarthritis, Neuropathy Clustered Wound: No Photos Wound Measurements Length: (cm) 0.1 Width: (cm) 0.1 Depth: (cm) 0.1 Area: (cm) 0.008 Volume: (cm) 0.001 % Reduction in Area: 99.2% % Reduction in Volume: 99.8% Epithelialization: Large (67-100%) Tunneling: No Undermining: No Wound Description Classification: Grade 1 Wound Margin: Distinct, outline attached Exudate Amount: None Present Foul Odor After Cleansing: No Slough/Fibrino No Wound Bed Granulation Amount: None Present (0%) Exposed Structure Necrotic Amount: None Present (0%) Fascia Exposed: No Fat Layer (Subcutaneous Tissue) Exposed: Yes Tendon Exposed: No Muscle Exposed: No Joint Exposed: No Bone Exposed: No Assessment Notes callous noted. Treatment Notes Wound #1RRRR (Foot) Wound Laterality: Plantar, Left, Medial Cleanser Peri-Wound Care Topical Primary Dressing Secondary Dressing Woven Gauze Sponges 2x2 in Discharge Instruction: Apply over primary dressing as directed. Optifoam Non-Adhesive Dressing, 4x4  in Discharge Instruction: Apply over primary dressing cut to form foam donut Secured With 17M [employer] T 4 x 2 (in/yd) ape Discharge Instruction: Secure dressing with tape as directed. Compression Wrap Compression Stockings Add-Ons Electronic Signature(s) Signed: 01/29/2021 4:31:55 PM By: Sandre Kitty Signed: 01/29/2021 5:52:09 PM By: Deon Pilling Entered By: Sandre Kitty on 01/29/2021 16:20:39 -------------------------------------------------------------------------------- Vitals Details Patient Name: Date of Service: Mcfarland, [place] RBA RA E. 01/29/2021 3:30 PM Medical  Record Number: [id number] Patient Account Number: [id number] Date of Birth/Sex: Treating RN: 01/30/1946 (75 y.o. [name] Primary Care Taira Knabe: [name] Other Clinician: Referring Selby Slovacek: Treating Yecheskel Kurek/Extender: Landis Martins Weeks in Treatment: 3 Vital Signs Time Taken: 14:58 Temperature (F): 99.3 Pulse (bpm): 92 Respiratory Rate (breaths/min): 18 Blood Pressure (mmHg): 156/74 Reference Range: 80 - 120 mg / dl Electronic Signature(s) Signed: 01/29/2021 5:52:09 PM By: Deon Pilling Entered By: Deon Pilling on 01/29/2021 15:09:10

## 2021-01-29 NOTE — Progress Notes (Addendum)
VILA, DORY ([id number]) Visit Report for 01/29/2021 Chief Complaint Document Details Patient Name: Date of Service: ADELIN, VENTRELLA RA E. 01/29/2021 3:30 PM Medical Record Number: [id number] Patient Account Number: [id number] Date of Birth/Sex: Treating RN: 10/23/1945 (75 y.o. [name] Other Clinician: Referring Provider: Treating Provider/Extender: Adele Schilder in Treatment: 3 Information Obtained from: Patient Chief Complaint Left foot ulcer Electronic Signature(s) Signed: 01/29/2021 2:49:09 PM By: Worthy Keeler PA-C Entered By: Worthy Keeler on 01/29/2021 14:49:08 -------------------------------------------------------------------------------- HPI Details Patient Name: Date of Service: Pett, [place] RBA RA E. 01/29/2021 3:30 PM Medical Record Number: [id number] Patient Account Number: [id number] Date of Birth/Sex: Treating RN: 03-24-1946 (75 y.o. [name] Other Clinician: Referring Provider: Treating Provider/Extender: Adele Schilder in Treatment: 3 History of Present Illness HPI Description: 01/17/2020 upon evaluation today patient presents for initial evaluation here in our clinic concerning issues she has been having with a left medial/plantar foot ulcer. This is actually been an issue for her since October 2020. She has been seeing Dr. Doran Durand for quite some time during that course. Fortunately there is no signs of active infection at this time. Or least no mention of this to have seen in general. With that being said unfortunately I do see some signs of erythema noted today that does have me concerned about the possibility of infection at this point in the surrounding area of the wound. There is also a warm to touch at the site which is somewhat concerning. Fortunately there is no evidence of systemic infection which is great news. The patient  does have a history of diabetes mellitus type 2, Charcot foot which is what led to the wound, and hypertension. She notes that she was in a cast for some time with Dr. Doran Durand for about 8 weeks. During that time they were utilizing according to the patient silver nitrate along with a foam doughnut and then Coban to secure in place in the cast in place. With that being said I do not have the actual records to review we are going to try to get a hold of those unfortunately they would not flow over into care everywhere I did look today. She has been seeing Dr. Doran Durand and his physician assistant Larkin Ina up until the end of May and apparently is still seeing them on a regular basis every 2 weeks roughly. She has also tried Iodosorb without effect here. 01/24/2020 upon evaluation today patient actually appears to be doing quite well with regard to her wounds. She has been tolerating the dressing changes without complication. Fortunately there is no signs of active infection spreading which is good news. Her culture did show signs of Staph aureus I did place her on Augmentin due to the erythema surrounding the wound. With that being said the wound does appear to be doing better she has her longer walking cast/boot and I think that is actually good for her for the time being. I am considering reinitiating total contact cast when she gets back from vacation but next week she will actually be out of town at [employer] she knows not to get in the water but she still obviously is planning to enjoy herself she is going to take it easy on her foot however. 02/07/2020 upon evaluation today patient appears to be doing fairly well in regard to her ulcer on her foot. Fortunately there is no signs of  severe infection at this time which is great news and overall very pleased in that regard. With that being said I do think that she could still benefit from a total contact cast. Nonetheless she is using her walking boot which at  least provide some protection and that it prevents some of the friction occurring when she is ambulating. 02/14/2020 upon evaluation today patient appears to be doing well with regard to her foot ulcer. This is actually measuring a little bit smaller yet again this week. Overall very pleased with where things stand and I do not see any signs of active infection at this time which is also good news. Since she is measuring better the patient has wanting to somewhat hold off on proceeding with the total contact cast which I think is reasonable at this point. 02/28/2020 on evaluation today patient appears to be doing well in general in regard to her wound although she has a lot of callus buildup as compared to last time I saw her. This is can require sharp debridement today. I do believe she really needs the total contact cast as well which we have discussed previous. 7/23; patient comes in for a total contact cast change 03/06/2020 on evaluation today patient appears to be doing quite well with regard to her wounds. Fortunately the wound bed is measuring smaller and looking much better there is little callus noted although there is some debridement necessary today. 03/13/2020 on evaluation today patient's wound actually appears to be doing excellent which is great news. With that being said unfortunately she is having some issues currently with her left leg where she does have cellulitis it appears. This may have come from an area that rubbed underneath the cast from last week that we noted we padded that area and it looks to be doing excellent at this point but nonetheless the leg was somewhat painful, swollen, and somewhat erythematous. She also had an elevated white blood cell count of 11.5 based on what I saw on looking at her records from the med center in [facility] from where she was seen yesterday. Unfortunately with [place] having a provider on vacation there was no one here in the clinic in the afternoon  when she called therefore she went to the ER as advised. Subsequently they did not cut off the cast as they did not have anyone from orthopedics there to do so and subsequently also did not have the ability to do the Doppler for evaluation of DVT They recommended therefore given her dose of Eliquis as well as . Augmentin and sent her home to come see [place] today to have the cast taken off and then she is supposed to go back to have the study for DVT performed they are following. 03/20/2020 upon evaluation today patient appears to be doing well with regard to her foot all things considered we have not been able to use the total contact cast due to the infection that she had last week. She has been on the doxycycline and she had a 10-day supply of that I do believe that is helping and her leg appears to be doing better. With that being said there is fortunately no signs of active infection systemically at this time which is good news. No fevers, chills, nausea, vomiting, or diarrhea. 03/27/2020 upon evaluation today patient appears to be doing well with regard to her foot ulcer. There does not appear to be signs of active infection which is great news. Overall I  am very pleased with where things stand at this point. 04/03/2020 upon evaluation today patient appears to be doing pretty well in regard to the overall appearance of her wound. Fortunately there is no signs of active infection at this time which is great news. No fevers, chills, nausea, vomiting, or diarrhea. With that being said she does have some blue-green drainage that actually is a little bit concerning to me for the possibility of Pseudomonas. I discussed that with the patient today. With that being said I do believe that we may be able to manage this however with the topical antibiotic cream as opposed to having to do anything oral especially since she seems to be doing so well with overall appearance of the wound. 04/10/2020 on evaluation  today patient appears to be doing about the same roughly in regard to the overall size of her wound. With that being said she fortunately has not shown any signs of worsening overall which is good news. I do believe that she is doing a great job trying to offload but again she may still do better with the cast. I do not see in the blue-green drainage that we noticed previously I do believe the gentamicin help in this regard. 04/17/2020 on evaluation today patient's wound appears to be doing about the same at this point. There is no significant improvement at this point. No fever chills noted. She is up for put the cast back on the day. That she states in a couple weeks she will need to have this off to go to a workshop. 04/24/2020 on evaluation today patient appears to be doing significantly better in regard to her wound. Fortunately there is no signs of active infection and overall feel like she is making great progress the cast seems to have done excellent for her. 05/01/2020 upon evaluation today patient presents for reevaluation she really does not appear to be doing too badly in regard to the actual wound on the left foot we have been managing. Unfortunately she has bilateral lower extremity edema with blisters between the webspace of her first and second toe on both feet. She has a tremendous amount of edema in the legs which I think is where this is coming from it does not appear to be infected but nonetheless I do believe this is can be something that needs to be addressed today. Obviously this means we probably will not be putting the cast on at this point. She attributes this to the fact that she was sitting with her feet on the floor much longer during a conference last week she had a great time but unfortunately had a lot of complications as a result. 05/08/2020 upon evaluation today patient appears to be doing somewhat better in regard to her wounds at this time. Fortunately there is no signs of  active infection which is great news. With that being said I do believe that the blisters have ruptured and unfortunately did not just reattach I will remove some of the blistered tissue today. With that being said I do think the wound itself on the plantar aspect of left foot does need to have sharp debridement. 05/15/2020 upon evaluation today patient appears to be doing about the same in regard to her foot ulcer. Unfortunately in the past week her husband had a fall where he sustained a mild traumatic brain bleed. Fortunately he is doing better but being that he was in the hospital she had a walk on this a lot more.  The wound does not appear to be any better is also not really appearing to be significantly worse which is good news. There is no signs of active infection at this time. 10/14; patient with a small diabetic wound on the medial part of her left foot. We have been using silver collagen a total contact cast making good progress. I think the patient had a series of blisters on her dorsal foot probably secondary to having her legs recumbent for 3 days while in a conference in [place]. We wrapped her leg last week these are all healed. We did not previously have her in compression on the right leg. 05/29/2020 upon evaluation today patient appears to be doing well with regard to the wound on the plantar aspect of her foot medially. This is measuring smaller and looking much better than last time I saw her. Again when I did see her last was 2 weeks back and the wound was significantly larger. I do believe the cast is helping and I believe the collagen is a good option for her. 06/05/2020 on evaluation today patient appears to be doing well with regard to her foot ulcer this is actually measuring significantly better and overall I feel like she is doing excellent. There is no signs of active infection at this time. 06/12/2020 upon evaluation today patient actually continues to show signs of good  improvement which is excellent news. I am extremely pleased with how she seems to be progressing at this point in regard to her wound. There is still some depth to the wound but I do believe the collagen is helping her quite a bit. 06/19/2020 upon evaluation today patient appears to be doing well with regard to her plantar foot ulcer. She is actually making excellent progress and in fact this appears to be almost completely healed. With that being said I do believe that the patient is going to actually require 1 more week in the cast although after that I am hopeful she will be ready for discharge. 06/26/2020 on evaluation today patient appears to be doing well in regard to her wound currently. Fortunately there is no signs of active infection in general I feel like she is doing excellent. This appears to be completely healed I think she is ready to come out of the cast. 11/29; patient comes back in the clinic today with a very quick reopening in the exact same area on the medial plantar foot. She had been healed out last time. She went back into some new balance shoes that she got at [employer] with a custom insert. As it turns out this wound also happened when wearing these shoes although there was some modification made I think with the wound initially happened they added some foam around the wound area. This obviously is not going to be sufficient. 07/17/2020 on evaluation today patient appears to be doing well with regard to her wound. Fortunately she seems to be making good progress. Unfortunately she was in the hospital due to a issue with colitis and had just been discharged today in fact. She tells me that her biggest concern here is that a lot of her numbers especially her creatinine were somewhat elevated and problematic. She is can be following up with her provider in order to have a further work-up at this time. With that being said she did need to come back in for a cast change. She did not  allow them to remove the cast due to the fact that  she did not feel like that was the issue whatsoever and indeed it does not appear that was the case her wound appears to be doing excellent today. 07/24/2020 upon evaluation today patient appears to be doing well in regard to her foot ulcer. She still has a small opening but this is showing signs of excellent improvement overall but they were very close to complete resolution. No fevers, chills, nausea, vomiting, or diarrhea. 07/31/2020 upon evaluation today patient actually appears to be doing excellent she is actually completely healed this is great news. Fortunately there is no signs of active infection at this time. No fevers, chills, nausea, vomiting, or diarrhea. The patient tells me that she did see Dr. Paulla Dolly in order to get to [place] so that she can have a custom shoe made. She saw him earlier last week. She does have an appointment with Liliane Channel on the sixth I believe on January she tells me Readmission: 08/28/2020 on evaluation today patient appears to be doing poorly in regard to her wound currently. She tells me this has reopened. The good news that she did get measured for and actually her shoes are on the way in from [facility].. With that being said she has been trying to stay off of this is much as possible using her wheelchair around home. Nonetheless has been somewhat difficult. The good news is her shoes should be here next week 09/04/2020 upon evaluation today patient appears to be doing well with regard to her wound. There is a little bit of callus buildup but nothing too significant she does tell me she was very active this week. Fortunately there is no signs of systemic infection at this point. The dorsal foot wound actually appears to be doing better. I think this is healed. 09/11/2020 upon evaluation today patient appears to actually be doing quite well in my opinion based on what I am seeing today. Fortunately there is no signs  of infection in fact her mother is certain that this is even open any longer based on what I see. Fortunately I think the patient has been doing everything she can to try to keep this under control. 09/18/2020 upon evaluation today patient appears to be doing about the same in regard to her foot ulcer. She has been tolerating the dressing changes without complication. Fortunately there is no sign of active infection at this time. No fevers, chills, nausea, vomiting, or diarrhea. She is going require some sharp debridement today. 09/25/2020 upon evaluation today patient appears to be doing well with regard to his wounds she has been tolerating the dressing changes without complication. Her wound appears to be completely healed based on what I am seeing at this point. There does not appear to be any signs of active infection at this time which is great news. No fevers, chills, nausea, vomiting, or diarrhea. She did get the cushion for her foot as well that she ordered to try to help keep pressure off of this area. That looks like it may be very beneficial for her to be honest. 10/02/2020 upon evaluation today patient appears to be doing more poorly in regard to her foot ulcer. Unfortunately this has reopened since we saw her last week. It apparently did not take too long at all for this to happen. She is obviously somewhat disappointed as she was hopeful that that would be time this thing would stay closed. Fortunately there is no evidence of active infection at this point. No fevers, chills, nausea,  vomiting, or diarrhea. With that being said I am good have to perform a little bit of sharp debridement clear away some of the debris currently. Including a minimal amount of callus at this time. 10/16/2020 upon evaluation today patient appears to be doing about the same in regard to her foot ulcer unfortunately. There does not appear to be any signs of active infection which is great news. No fever chills noted  she has been tolerating the dressing changes without complication which is great news. Unfortunately she tells me that she has been very depressed about the situation is getting very frustrating to her that she continues to have issues despite everything that she is trying to do to stay off of her foot. She is extremely discouraged and I do hate to hear this. Obviously we have been hopeful that if she got her shoes that would make a difference she is actually can be getting those tomorrow but I am not certain that that alone is good to be enough to get this to heal. We may end up having to consider going back into a total contact cast to get this to heal and then subsequently once we get her healed get her into her new diabetic shoes which will and my hope anyway keep this from reopening again. 10/23/2020 upon evaluation today patient appears to be doing a little worse both in regard to the size of her wound as well as in regard to the fact that she has erythema surrounding the wound and wrap around the lateral part of her foot where she tells me has been somewhat sore. Fortunately there does not appear to be any evidence of infection systemically but locally definitely there is erythema and warmth consistent with local cellulitis. I am glad to remove some of the callus as well. 10/30/2020 upon evaluation today patient appears to be doing well with regard to her wound on the plantar aspect of her foot. This is significantly improved compared to last week. 10/30/2020 upon evaluation today patient actually is making excellent progress her wound appears to show signs of great improvement which is wonderful and that she is extremely pleased to hear this. She actually leaves Sunday to go on her trip with her half sister and friends. 11/13/2020 on evaluation today patient's wound actually appears to be doing quite well. There does not appear to be any signs of infection it has been 2 weeks since have seen her  she does have a little bit of callus buildup here today but at the same time I do not believe the vacation time set her back any just has not really made a lot of significant improvement. She is done with her antibiotics at this point. 11/20/2020 upon evaluation today patient appears to be doing poorly in regard to her foot. In fact this appears to be showing signs of Infection. She has erythema and warmth that is concerning. I know she is very discouraged as this seems to be a recurrent issue. I think we may need to delve further into the possibility of something deeper going on here as far as a structural infection. 11/27/2020 patient presents for 1 week follow-up. She had a culture of her left foot ulcer that grew staph aureus sensitive to Augmentin. This was called in by [place], [place] this morning. She is currently on doxycycline for previous culture result. Patient also had an x-ray done of her left foot that had conflicting results. We asked for clarification and was told we  would have clarification on Monday. Patient states overall she is doing well. She tries to stay off of her foot is much as possible. 12/04/2020 patient presents for 1 week follow-up. She is currently taking Augmentin and tolerating this well. It was confirmed that her x-ray had no acute osseous abnormalities. We switched her to collagen from silver alginate last week. Patient is overall doing well and trying to stay off her foot is much as possible. She has no complaints today. 12/11/2020 upon evaluation today patient appears to be doing well with regard to her wound. She has been tolerating the dressing changes without complication. Fortunately there is no signs of active infection noted at this point. I think she is definitely ready to go back into the cast. 12/18/2020 upon evaluation today patient appears to be doing well with regard to her wound. She has been tolerating the dressing changes without complication. Fortunately there  is no signs of active infection at this time. No fevers, chills, nausea, vomiting, or diarrhea. 12/25/2020 upon evaluation today patient appears to be doing well in general in regard to her plantar foot ulcer. With that being said she tells me currently that there does not appear to be any signs of pain or problems which is great in fact the wound appears to be almost completely closed which is also excellent news. I do not see any signs of infection which is great news as well. No fevers, chills, nausea, vomiting, or diarrhea. 5/25; patient presents for 1 week follow-up. She has had the cast in place for the past week and has tolerated this well. She states she is getting orthotics on 5/10. She currently denies signs of infection and has no issues today. 01/08/2021 upon evaluation today patient appears unfortunately to be present today with issues that she has been having with her plantar foot wound. This apparently has reoccurred unfortunately and is causing quite a bit of discomfort and problems for her. There does not appear to be any signs of active infection which is great news and overall I am very pleased with where things stand in that regard but nonetheless I think she is definitely worried about where this can lead due to past history. She was just taken out of the cast and marked is healed last week. 01/15/2021 upon evaluation today patient unfortunately appears to be doing well but nonetheless continues to have issues with pressure and friction to her foot whenever she is not wearing the cast. Again today I think the wound is significantly improved and almost completely healed in 1 week's time after debridement last week. Nonetheless this has continued to be an issue for that reason we did do a MRI with contrast which really revealed no evidence whatsoever of an issue here. Again this is great but nonetheless still offers up the question what regularly to keep this from continuing to just reopen  every time that we see. Again I really do not know exactly what to do is I feel like this falls on the ground outside of wound care more into a issue more specifically with the orthopedic side of things. I understand the concern that a brace would probably be problematic for the patient. That makes complete sense to be honest. It would probably cause rubbing on other areas which would not be good. With that being said also I am not sure what else we can do other than just keeping her in the cast which is really not a feasible option either. Every  time I take her out of the cast this reopens almost immediately. 01/22/2021 upon evaluation today patient appears to be doing well with regard to her foot ulcer. This is going to require little bit of sharp debridement today. Fortunately there does not appear to be any signs of active infection at this time. No fevers, chills, nausea, vomiting, or diarrhea. I did previously last week tell the patient that I would contact Dr. Nona Dell office I did do so I spoke with Larkin Ina who is the physician assistant for Dr. Doran Durand. Subsequently he did relate to me that they really did not feel like there was anything that could be done differently for the patient. That is from an orthopedic standpoint. He said that the patient could make an appoint with Dr. Doran Durand to discuss further but he was not really sure that would make a big difference in the treatment plan to be honest. Subsequently he further did recommend that potentially the patient after I offered a thought on the potential for a Crow boot could benefit from this since she seems to do well with a total contact cast. With that being said he did advise that [place] would probably be the way to go for that if need be. 01/29/2021 upon evaluation today patient appears to be doing well with regard to her foot ulcer. In fact this pretty much appears to be healed. With that being said of course I am reluctant to absolutely  call it healed and just discharge her due to things we have had going on in the past. I think we still need to monitor and watch out for this for that reason I am going to ensure nothing is hiding or potentially get a reopen honest by bringing her back next week after using some foam over the area today. Electronic Signature(s) Signed: 01/29/2021 3:49:40 PM By: Worthy Keeler PA-C Entered By: Worthy Keeler on 01/29/2021 15:49:40 -------------------------------------------------------------------------------- Physical Exam Details Patient Name: Date of Service: LAIKYN, GEWIRTZ RBA RA E. 01/29/2021 3:30 PM Medical Record Number: [id number] Patient Account Number: [id number] Date of Birth/Sex: Treating RN: 12-27-1945 (75 y.o. [name] Other Clinician: Referring Provider: Treating Provider/Extender: Landis Martins Weeks in Treatment: 3 Constitutional Well-nourished and well-hydrated in no acute distress. Respiratory normal breathing without difficulty. Psychiatric this patient is able to make decisions and demonstrates good insight into disease process. Alert and Oriented x 3. pleasant and cooperative. Notes Upon inspection patient's wound bed actually showed signs of good granulation epithelization at this point. There does not appear to be any signs of active infection which is great and overall very pleased with where things stand today. No fevers, chills, nausea, vomiting, or diarrhea. Electronic Signature(s) Signed: 01/29/2021 3:49:57 PM By: Worthy Keeler PA-C Entered By: Worthy Keeler on 01/29/2021 15:49:57 -------------------------------------------------------------------------------- Physician Orders Details Patient Name: Date of Service: [place], [place] RBA RA E. 01/29/2021 3:30 PM Medical Record Number: [id number] Patient Account Number: [id number] Date of Birth/Sex: Treating RN: [birth year] (75 y.o. [name] Primary  Care Provider: [name] Other Clinician: Referring Provider: Treating Provider/Extender: Adele Schilder in Treatment: 3 Verbal / Phone Orders: No Diagnosis Coding ICD-10 Coding Code Description E11.621 Type 2 diabetes mellitus with foot ulcer L97.522 Non-pressure chronic ulcer of other part of left foot with fat layer exposed M14.672 Charcot's joint, left ankle and foot E11.40 Type 2 diabetes mellitus with diabetic neuropathy, unspecified Follow-up Appointments Return Appointment in 1 week.  Bathing/ Shower/ Hygiene May shower and wash wound with soap and water. Off-Loading Open toe surgical shoe to: - left foot at all times Additional Orders / Instructions Follow Nutritious Diet Wound Treatment Wound #[id number] - Foot Wound Laterality: Plantar, Left, Medial Secondary Dressing: Woven Gauze Sponges 2x2 in 1 x Per Week/7 Days Discharge Instructions: Apply over primary dressing as directed. Secondary Dressing: Optifoam Non-Adhesive Dressing, 4x4 in 1 x Per Week/7 Days Discharge Instructions: Apply over primary dressing cut to form foam donut Secured With: 109M Medipore H Soft Cloth Surgical T 4 x 2 (in/yd) 1 x Per Week/7 Days ape Discharge Instructions: Secure dressing with tape as directed. Electronic Signature(s) Signed: 01/29/2021 4:01:47 PM By: Worthy Keeler PA-C Signed: 01/29/2021 5:44:00 PM By: [name] RN, BSN Entered By: [name] on 01/29/2021 15:46:14 -------------------------------------------------------------------------------- Problem List Details Patient Name: Date of Service: [name] 01/29/2021 3:30 PM Medical Record Number: [id number] Patient Account Number: [id number] Date of Birth/Sex: Treating RN: [birth year] (75 y.o. [name] Other Clinician: Referring Provider: Treating Provider/Extender: Landis Martins Weeks in Treatment: 3 Active  Problems ICD-10 Encounter Code Description Active Date MDM Diagnosis E11.621 Type 2 diabetes mellitus with foot ulcer 01/08/2021 No Yes L97.522 Non-pressure chronic ulcer of other part of left foot with fat layer exposed 01/08/2021 No Yes M14.672 Charcot's joint, left ankle and foot 01/08/2021 No Yes E11.40 Type 2 diabetes mellitus with diabetic neuropathy, unspecified 01/08/2021 No Yes Inactive Problems Resolved Problems Electronic Signature(s) Signed: 01/29/2021 2:49:03 PM By: Worthy Keeler PA-C Entered By: Worthy Keeler on 01/29/2021 14:49:03 -------------------------------------------------------------------------------- Progress Note Details Patient Name: Date of Service: [place], [place] RBA RA E. 01/29/2021 3:30 PM Medical Record Number: [id number] Patient Account Number: [id number] Date of Birth/Sex: Treating RN: February 11, 1946 (75 y.o. [name] Other Clinician: Referring Provider: Treating Provider/Extender: Adele Schilder in Treatment: 3 Subjective Chief Complaint Information obtained from Patient Left foot ulcer History of Present Illness (HPI) 01/17/2020 upon evaluation today patient presents for initial evaluation here in our clinic concerning issues she has been having with a left medial/plantar foot ulcer. This is actually been an issue for her since October 2020. She has been seeing Dr. Doran Durand for quite some time during that course. Fortunately there is no signs of active infection at this time. Or least no mention of this to have seen in general. With that being said unfortunately I do see some signs of erythema noted today that does have me concerned about the possibility of infection at this point in the surrounding area of the wound. There is also a warm to touch at the site which is somewhat concerning. Fortunately there is no evidence of systemic infection which is great news. The patient does have a history of  diabetes mellitus type 2, Charcot foot which is what led to the wound, and hypertension. She notes that she was in a cast for some time with Dr. Doran Durand for about 8 weeks. During that time they were utilizing according to the patient silver nitrate along with a foam doughnut and then Coban to secure in place in the cast in place. With that being said I do not have the actual records to review we are going to try to get a hold of those unfortunately they would not flow over into care everywhere I did look today. She has been seeing Dr. Doran Durand and his physician assistant Larkin Ina up  until the end of May and apparently is still seeing them on a regular basis every 2 weeks roughly. She has also tried Iodosorb without effect here. 01/24/2020 upon evaluation today patient actually appears to be doing quite well with regard to her wounds. She has been tolerating the dressing changes without complication. Fortunately there is no signs of active infection spreading which is good news. Her culture did show signs of Staph aureus I did place her on Augmentin due to the erythema surrounding the wound. With that being said the wound does appear to be doing better she has her longer walking cast/boot and I think that is actually good for her for the time being. I am considering reinitiating total contact cast when she gets back from vacation but next week she will actually be out of town at [employer] she knows not to get in the water but she still obviously is planning to enjoy herself she is going to take it easy on her foot however. 02/07/2020 upon evaluation today patient appears to be doing fairly well in regard to her ulcer on her foot. Fortunately there is no signs of severe infection at this time which is great news and overall very pleased in that regard. With that being said I do think that she could still benefit from a total contact cast. Nonetheless she is using her walking boot which at least provide some  protection and that it prevents some of the friction occurring when she is ambulating. 02/14/2020 upon evaluation today patient appears to be doing well with regard to her foot ulcer. This is actually measuring a little bit smaller yet again this week. Overall very pleased with where things stand and I do not see any signs of active infection at this time which is also good news. Since she is measuring better the patient has wanting to somewhat hold off on proceeding with the total contact cast which I think is reasonable at this point. 02/28/2020 on evaluation today patient appears to be doing well in general in regard to her wound although she has a lot of callus buildup as compared to last time I saw her. This is can require sharp debridement today. I do believe she really needs the total contact cast as well which we have discussed previous. 7/23; patient comes in for a total contact cast change 03/06/2020 on evaluation today patient appears to be doing quite well with regard to her wounds. Fortunately the wound bed is measuring smaller and looking much better there is little callus noted although there is some debridement necessary today. 03/13/2020 on evaluation today patient's wound actually appears to be doing excellent which is great news. With that being said unfortunately she is having some issues currently with her left leg where she does have cellulitis it appears. This may have come from an area that rubbed underneath the cast from last week that we noted we padded that area and it looks to be doing excellent at this point but nonetheless the leg was somewhat painful, swollen, and somewhat erythematous. She also had an elevated white blood cell count of 11.5 based on what I saw on looking at her records from the med center in [facility] from where she was seen yesterday. Unfortunately with [place] having a provider on vacation there was no one here in the clinic in the afternoon when she  called therefore she went to the ER as advised. Subsequently they did not cut off the cast as they  did not have anyone from orthopedics there to do so and subsequently also did not have the ability to do the Doppler for evaluation of DVT They recommended therefore given her dose of Eliquis as well as . Augmentin and sent her home to come see [place] today to have the cast taken off and then she is supposed to go back to have the study for DVT performed they are following. 03/20/2020 upon evaluation today patient appears to be doing well with regard to her foot all things considered we have not been able to use the total contact cast due to the infection that she had last week. She has been on the doxycycline and she had a 10-day supply of that I do believe that is helping and her leg appears to be doing better. With that being said there is fortunately no signs of active infection systemically at this time which is good news. No fevers, chills, nausea, vomiting, or diarrhea. 03/27/2020 upon evaluation today patient appears to be doing well with regard to her foot ulcer. There does not appear to be signs of active infection which is great news. Overall I am very pleased with where things stand at this point. 04/03/2020 upon evaluation today patient appears to be doing pretty well in regard to the overall appearance of her wound. Fortunately there is no signs of active infection at this time which is great news. No fevers, chills, nausea, vomiting, or diarrhea. With that being said she does have some blue-green drainage that actually is a little bit concerning to me for the possibility of Pseudomonas. I discussed that with the patient today. With that being said I do believe that we may be able to manage this however with the topical antibiotic cream as opposed to having to do anything oral especially since she seems to be doing so well with overall appearance of the wound. 04/10/2020 on evaluation today  patient appears to be doing about the same roughly in regard to the overall size of her wound. With that being said she fortunately has not shown any signs of worsening overall which is good news. I do believe that she is doing a great job trying to offload but again she may still do better with the cast. I do not see in the blue-green drainage that we noticed previously I do believe the gentamicin help in this regard. 04/17/2020 on evaluation today patient's wound appears to be doing about the same at this point. There is no significant improvement at this point. No fever chills noted. She is up for put the cast back on the day. That she states in a couple weeks she will need to have this off to go to a workshop. 04/24/2020 on evaluation today patient appears to be doing significantly better in regard to her wound. Fortunately there is no signs of active infection and overall feel like she is making great progress the cast seems to have done excellent for her. 05/01/2020 upon evaluation today patient presents for reevaluation she really does not appear to be doing too badly in regard to the actual wound on the left foot we have been managing. Unfortunately she has bilateral lower extremity edema with blisters between the webspace of her first and second toe on both feet. She has a tremendous amount of edema in the legs which I think is where this is coming from it does not appear to be infected but nonetheless I do believe this is can be something that needs  to be addressed today. Obviously this means we probably will not be putting the cast on at this point. She attributes this to the fact that she was sitting with her feet on the floor much longer during a conference last week she had a great time but unfortunately had a lot of complications as a result. 05/08/2020 upon evaluation today patient appears to be doing somewhat better in regard to her wounds at this time. Fortunately there is no signs of  active infection which is great news. With that being said I do believe that the blisters have ruptured and unfortunately did not just reattach I will remove some of the blistered tissue today. With that being said I do think the wound itself on the plantar aspect of left foot does need to have sharp debridement. 05/15/2020 upon evaluation today patient appears to be doing about the same in regard to her foot ulcer. Unfortunately in the past week her husband had a fall where he sustained a mild traumatic brain bleed. Fortunately he is doing better but being that he was in the hospital she had a walk on this a lot more. The wound does not appear to be any better is also not really appearing to be significantly worse which is good news. There is no signs of active infection at this time. 10/14; patient with a small diabetic wound on the medial part of her left foot. We have been using silver collagen a total contact cast making good progress. I think the patient had a series of blisters on her dorsal foot probably secondary to having her legs recumbent for 3 days while in a conference in [place]. We wrapped her leg last week these are all healed. We did not previously have her in compression on the right leg. 05/29/2020 upon evaluation today patient appears to be doing well with regard to the wound on the plantar aspect of her foot medially. This is measuring smaller and looking much better than last time I saw her. Again when I did see her last was 2 weeks back and the wound was significantly larger. I do believe the cast is helping and I believe the collagen is a good option for her. 06/05/2020 on evaluation today patient appears to be doing well with regard to her foot ulcer this is actually measuring significantly better and overall I feel like she is doing excellent. There is no signs of active infection at this time. 06/12/2020 upon evaluation today patient actually continues to show signs of good  improvement which is excellent news. I am extremely pleased with how she seems to be progressing at this point in regard to her wound. There is still some depth to the wound but I do believe the collagen is helping her quite a bit. 06/19/2020 upon evaluation today patient appears to be doing well with regard to her plantar foot ulcer. She is actually making excellent progress and in fact this appears to be almost completely healed. With that being said I do believe that the patient is going to actually require 1 more week in the cast although after that I am hopeful she will be ready for discharge. 06/26/2020 on evaluation today patient appears to be doing well in regard to her wound currently. Fortunately there is no signs of active infection in general I feel like she is doing excellent. This appears to be completely healed I think she is ready to come out of the cast. 11/29; patient comes back in the  clinic today with a very quick reopening in the exact same area on the medial plantar foot. She had been healed out last time. She went back into some new balance shoes that she got at [employer] with a custom insert. As it turns out this wound also happened when wearing these shoes although there was some modification made I think with the wound initially happened they added some foam around the wound area. This obviously is not going to be sufficient. 07/17/2020 on evaluation today patient appears to be doing well with regard to her wound. Fortunately she seems to be making good progress. Unfortunately she was in the hospital due to a issue with colitis and had just been discharged today in fact. She tells me that her biggest concern here is that a lot of her numbers especially her creatinine were somewhat elevated and problematic. She is can be following up with her provider in order to have a further work-up at this time. With that being said she did need to come back in for a cast change. She did not  allow them to remove the cast due to the fact that she did not feel like that was the issue whatsoever and indeed it does not appear that was the case her wound appears to be doing excellent today. 07/24/2020 upon evaluation today patient appears to be doing well in regard to her foot ulcer. She still has a small opening but this is showing signs of excellent improvement overall but they were very close to complete resolution. No fevers, chills, nausea, vomiting, or diarrhea. 07/31/2020 upon evaluation today patient actually appears to be doing excellent she is actually completely healed this is great news. Fortunately there is no signs of active infection at this time. No fevers, chills, nausea, vomiting, or diarrhea. The patient tells me that she did see Dr. Paulla Dolly in order to get to [place] so that she can have a custom shoe made. She saw him earlier last week. She does have an appointment with Liliane Channel on the sixth I believe on January she tells me Readmission: 08/28/2020 on evaluation today patient appears to be doing poorly in regard to her wound currently. She tells me this has reopened. The good news that she did get measured for and actually her shoes are on the way in from [facility].. With that being said she has been trying to stay off of this is much as possible using her wheelchair around home. Nonetheless has been somewhat difficult. The good news is her shoes should be here next week 09/04/2020 upon evaluation today patient appears to be doing well with regard to her wound. There is a little bit of callus buildup but nothing too significant she does tell me she was very active this week. Fortunately there is no signs of systemic infection at this point. The dorsal foot wound actually appears to be doing better. I think this is healed. 09/11/2020 upon evaluation today patient appears to actually be doing quite well in my opinion based on what I am seeing today. Fortunately there is no signs  of infection in fact her mother is certain that this is even open any longer based on what I see. Fortunately I think the patient has been doing everything she can to try to keep this under control. 09/18/2020 upon evaluation today patient appears to be doing about the same in regard to her foot ulcer. She has been tolerating the dressing changes without complication. Fortunately there is no  sign of active infection at this time. No fevers, chills, nausea, vomiting, or diarrhea. She is going require some sharp debridement today. 09/25/2020 upon evaluation today patient appears to be doing well with regard to his wounds she has been tolerating the dressing changes without complication. Her wound appears to be completely healed based on what I am seeing at this point. There does not appear to be any signs of active infection at this time which is great news. No fevers, chills, nausea, vomiting, or diarrhea. She did get the cushion for her foot as well that she ordered to try to help keep pressure off of this area. That looks like it may be very beneficial for her to be honest. 10/02/2020 upon evaluation today patient appears to be doing more poorly in regard to her foot ulcer. Unfortunately this has reopened since we saw her last week. It apparently did not take too long at all for this to happen. She is obviously somewhat disappointed as she was hopeful that that would be time this thing would stay closed. Fortunately there is no evidence of active infection at this point. No fevers, chills, nausea, vomiting, or diarrhea. With that being said I am good have to perform a little bit of sharp debridement clear away some of the debris currently. Including a minimal amount of callus at this time. 10/16/2020 upon evaluation today patient appears to be doing about the same in regard to her foot ulcer unfortunately. There does not appear to be any signs of active infection which is great news. No fever chills noted  she has been tolerating the dressing changes without complication which is great news. Unfortunately she tells me that she has been very depressed about the situation is getting very frustrating to her that she continues to have issues despite everything that she is trying to do to stay off of her foot. She is extremely discouraged and I do hate to hear this. Obviously we have been hopeful that if she got her shoes that would make a difference she is actually can be getting those tomorrow but I am not certain that that alone is good to be enough to get this to heal. We may end up having to consider going back into a total contact cast to get this to heal and then subsequently once we get her healed get her into her new diabetic shoes which will and my hope anyway keep this from reopening again. 10/23/2020 upon evaluation today patient appears to be doing a little worse both in regard to the size of her wound as well as in regard to the fact that she has erythema surrounding the wound and wrap around the lateral part of her foot where she tells me has been somewhat sore. Fortunately there does not appear to be any evidence of infection systemically but locally definitely there is erythema and warmth consistent with local cellulitis. I am glad to remove some of the callus as well. 10/30/2020 upon evaluation today patient appears to be doing well with regard to her wound on the plantar aspect of her foot. This is significantly improved compared to last week. 10/30/2020 upon evaluation today patient actually is making excellent progress her wound appears to show signs of great improvement which is wonderful and that she is extremely pleased to hear this. She actually leaves Sunday to go on her trip with her half sister and friends. 11/13/2020 on evaluation today patient's wound actually appears to be doing quite well. There does  not appear to be any signs of infection it has been 2 weeks since have seen her  she does have a little bit of callus buildup here today but at the same time I do not believe the vacation time set her back any just has not really made a lot of significant improvement. She is done with her antibiotics at this point. 11/20/2020 upon evaluation today patient appears to be doing poorly in regard to her foot. In fact this appears to be showing signs of Infection. She has erythema and warmth that is concerning. I know she is very discouraged as this seems to be a recurrent issue. I think we may need to delve further into the possibility of something deeper going on here as far as a structural infection. 11/27/2020 patient presents for 1 week follow-up. She had a culture of her left foot ulcer that grew staph aureus sensitive to Augmentin. This was called in by [place], [place] this morning. She is currently on doxycycline for previous culture result. Patient also had an x-ray done of her left foot that had conflicting results. We asked for clarification and was told we would have clarification on Monday. Patient states overall she is doing well. She tries to stay off of her foot is much as possible. 12/04/2020 patient presents for 1 week follow-up. She is currently taking Augmentin and tolerating this well. It was confirmed that her x-ray had no acute osseous abnormalities. We switched her to collagen from silver alginate last week. Patient is overall doing well and trying to stay off her foot is much as possible. She has no complaints today. 12/11/2020 upon evaluation today patient appears to be doing well with regard to her wound. She has been tolerating the dressing changes without complication. Fortunately there is no signs of active infection noted at this point. I think she is definitely ready to go back into the cast. 12/18/2020 upon evaluation today patient appears to be doing well with regard to her wound. She has been tolerating the dressing changes without complication. Fortunately there  is no signs of active infection at this time. No fevers, chills, nausea, vomiting, or diarrhea. 12/25/2020 upon evaluation today patient appears to be doing well in general in regard to her plantar foot ulcer. With that being said she tells me currently that there does not appear to be any signs of pain or problems which is great in fact the wound appears to be almost completely closed which is also excellent news. I do not see any signs of infection which is great news as well. No fevers, chills, nausea, vomiting, or diarrhea. 5/25; patient presents for 1 week follow-up. She has had the cast in place for the past week and has tolerated this well. She states she is getting orthotics on 5/10. She currently denies signs of infection and has no issues today. 01/08/2021 upon evaluation today patient appears unfortunately to be present today with issues that she has been having with her plantar foot wound. This apparently has reoccurred unfortunately and is causing quite a bit of discomfort and problems for her. There does not appear to be any signs of active infection which is great news and overall I am very pleased with where things stand in that regard but nonetheless I think she is definitely worried about where this can lead due to past history. She was just taken out of the cast and marked is healed last week. 01/15/2021 upon evaluation today patient unfortunately appears to be doing  well but nonetheless continues to have issues with pressure and friction to her foot whenever she is not wearing the cast. Again today I think the wound is significantly improved and almost completely healed in 1 week's time after debridement last week. Nonetheless this has continued to be an issue for that reason we did do a MRI with contrast which really revealed no evidence whatsoever of an issue here. Again this is great but nonetheless still offers up the question what regularly to keep this from continuing to just reopen  every time that we see. Again I really do not know exactly what to do is I feel like this falls on the ground outside of wound care more into a issue more specifically with the orthopedic side of things. I understand the concern that a brace would probably be problematic for the patient. That makes complete sense to be honest. It would probably cause rubbing on other areas which would not be good. With that being said also I am not sure what else we can do other than just keeping her in the cast which is really not a feasible option either. Every time I take her out of the cast this reopens almost immediately. 01/22/2021 upon evaluation today patient appears to be doing well with regard to her foot ulcer. This is going to require little bit of sharp debridement today. Fortunately there does not appear to be any signs of active infection at this time. No fevers, chills, nausea, vomiting, or diarrhea. I did previously last week tell the patient that I would contact Dr. Nona Dell office I did do so I spoke with Larkin Ina who is the physician assistant for Dr. Doran Durand. Subsequently he did relate to me that they really did not feel like there was anything that could be done differently for the patient. That is from an orthopedic standpoint. He said that the patient could make an appoint with Dr. Doran Durand to discuss further but he was not really sure that would make a big difference in the treatment plan to be honest. Subsequently he further did recommend that potentially the patient after I offered a thought on the potential for a [place] boot could benefit from this since she seems to do well with a total contact cast. With that being said he did advise that [place] would probably be the way to go for that if need be. 01/29/2021 upon evaluation today patient appears to be doing well with regard to her foot ulcer. In fact this pretty much appears to be healed. With that being said of course I am reluctant to absolutely  call it healed and just discharge her due to things we have had going on in the past. I think we still need to monitor and watch out for this for that reason I am going to ensure nothing is hiding or potentially get a reopen honest by bringing her back next week after using some foam over the area today. Objective Constitutional Well-nourished and well-hydrated in no acute distress. Vitals Time Taken: 2:58 PM, Temperature: 99.3 F, Pulse: 92 bpm, Respiratory Rate: 18 breaths/min, Blood Pressure: 156/74 mmHg. Respiratory normal breathing without difficulty. Psychiatric this patient is able to make decisions and demonstrates good insight into disease process. Alert and Oriented x 3. pleasant and cooperative. General Notes: Upon inspection patient's wound bed actually showed signs of good granulation epithelization at this point. There does not appear to be any signs of active infection which is great and overall very pleased with  where things stand today. No fevers, chills, nausea, vomiting, or diarrhea. Integumentary (Hair, Skin) Wound #1RRRR status is Open. Original cause of wound was Gradually Appeared. The date acquired was: 05/11/2019. The wound has been in treatment 54 weeks. The wound is located on the [place]. The wound measures 0.1cm length x 0.1cm width x 0.1cm depth; 0.008cm^2 area and 0.001cm^3 volume. There is Fat Layer (Subcutaneous Tissue) exposed. There is no tunneling or undermining noted. There is a none present amount of drainage noted. The wound margin is distinct with the outline attached to the wound base. There is no granulation within the wound bed. There is no necrotic tissue within the wound bed. General Notes: callous noted. Assessment Active Problems ICD-10 Type 2 diabetes mellitus with foot ulcer Non-pressure chronic ulcer of other part of left foot with fat layer exposed Charcot's joint, left ankle and foot Type 2 diabetes mellitus with diabetic  neuropathy, unspecified Plan Follow-up Appointments: Return Appointment in 1 week. Bathing/ Shower/ Hygiene: May shower and wash wound with soap and water. Off-Loading: Open toe surgical shoe to: - left foot at all times Additional Orders / Instructions: Follow Nutritious Diet WOUND #1RRRR: - Foot Wound Laterality: Plantar, Left, Medial Secondary Dressing: Woven Gauze Sponges 2x2 in 1 x Per Week/7 Days Discharge Instructions: Apply over primary dressing as directed. Secondary Dressing: Optifoam Non-Adhesive Dressing, 4x4 in 1 x Per Week/7 Days Discharge Instructions: Apply over primary dressing cut to form foam donut Secured With: 91M Medipore H Soft Cloth Surgical T 4 x 2 (in/yd) 1 x Per Week/7 Days ape Discharge Instructions: Secure dressing with tape as directed. 1. I would suggest we going and continue with the wound care measures as before and the patient is in agreement with the plan. This includes the use of foam cut and a doughnut to offload. I think that this is probably the best option. We discussed continuing the cast for 1 more week but the patient would like to try this without and then recheck next week and see where things stand. If we need to reapply the cast then to keep it closed until she can be seen by the upcoming doctors for their appointments and we can do that. 2. I am also can recommend that she take it easy and not walk too much at this point obviously we want to try to keep pressure off is much as possible in regard to her foot. Obviously the goal is to try to get her into being able to do a little bit more little by little. We will see patient back for reevaluation in 1 week here in the clinic. If anything worsens or changes patient will contact our office for additional recommendations. Electronic Signature(s) Signed: 01/29/2021 3:50:53 PM By: Worthy Keeler PA-C Entered By: Worthy Keeler on 01/29/2021  15:50:52 -------------------------------------------------------------------------------- SuperBill Details Patient Name: Date of Service: Mehlhoff, [name] 01/29/2021 Medical Record Number: [id number] Patient Account Number: [id number] Date of Birth/Sex: Treating RN: 1946/01/21 (75 y.o. [name], [name] Primary Care Provider: [name] Other Clinician: Referring Provider: Treating Provider/Extender: Landis Martins Weeks in Treatment: 3 Diagnosis Coding ICD-10 Codes Code Description E11.621 Type 2 diabetes mellitus with foot ulcer L97.522 Non-pressure chronic ulcer of other part of left foot with fat layer exposed M14.672 Charcot's joint, left ankle and foot E11.40 Type 2 diabetes mellitus with diabetic neuropathy, unspecified Facility Procedures CPT4 Code: [id number] Description: 99213 - WOUND CARE VISIT-LEV 3 EST PT Modifier: Quantity: 1 Physician Procedures :  CPT4 Code Description Modifier [id number] - WC PHYS LEVEL 3 - EST PT ICD-10 Diagnosis Description E11.621 Type 2 diabetes mellitus with foot ulcer L97.522 Non-pressure chronic ulcer of other part of left foot with fat layer exposed M14.672 Charcot's  joint, left ankle and foot E11.40 Type 2 diabetes mellitus with diabetic neuropathy, unspecified Quantity: 1 Electronic Signature(s) Signed: 01/29/2021 3:51:11 PM By: Worthy Keeler PA-C Entered By: Worthy Keeler on 01/29/2021 15:51:10

## 2021-01-30 ENCOUNTER — Encounter: Payer: Self-pay | Admitting: Internal Medicine

## 2021-01-30 DIAGNOSIS — L4059 Other psoriatic arthropathy: Secondary | ICD-10-CM | POA: Diagnosis not present

## 2021-01-30 DIAGNOSIS — D649 Anemia, unspecified: Secondary | ICD-10-CM | POA: Diagnosis not present

## 2021-01-31 DIAGNOSIS — M170 Bilateral primary osteoarthritis of knee: Secondary | ICD-10-CM | POA: Diagnosis not present

## 2021-02-05 ENCOUNTER — Other Ambulatory Visit: Payer: Self-pay

## 2021-02-05 ENCOUNTER — Encounter (HOSPITAL_BASED_OUTPATIENT_CLINIC_OR_DEPARTMENT_OTHER): Payer: Medicare Other | Admitting: Physician Assistant

## 2021-02-05 DIAGNOSIS — E1151 Type 2 diabetes mellitus with diabetic peripheral angiopathy without gangrene: Secondary | ICD-10-CM | POA: Diagnosis not present

## 2021-02-05 DIAGNOSIS — L97522 Non-pressure chronic ulcer of other part of left foot with fat layer exposed: Secondary | ICD-10-CM | POA: Diagnosis not present

## 2021-02-05 DIAGNOSIS — E11621 Type 2 diabetes mellitus with foot ulcer: Secondary | ICD-10-CM | POA: Diagnosis not present

## 2021-02-05 DIAGNOSIS — E1140 Type 2 diabetes mellitus with diabetic neuropathy, unspecified: Secondary | ICD-10-CM | POA: Diagnosis not present

## 2021-02-05 NOTE — Progress Notes (Addendum)
SHARICA, ROEDEL ([id number]) Visit Report for 02/05/2021 Chief Complaint Document Details Patient Name: Date of Service: DEVENEY, BAYON RA E. 02/05/2021 8:00 A M Medical Record Number: [id number] Patient Account Number: [id number] Date of Birth/Sex: Treating RN: 11-08-1945 (75 y.o. [name] Other Clinician: Referring Provider: Treating Provider/Extender: Adele Schilder in Treatment: 4 Information Obtained from: Patient Chief Complaint Left foot ulcer Electronic Signature(s) Signed: 02/05/2021 8:19:50 AM By: Worthy Keeler PA-C Entered By: Worthy Keeler on 02/05/2021 08:19:50 -------------------------------------------------------------------------------- HPI Details Patient Name: Date of Service: [place], [place] RBA RA E. 02/05/2021 8:00 A M Medical Record Number: [id number] Patient Account Number: [id number] Date of Birth/Sex: Treating RN: Jul 21, 1946 (75 y.o. [name] Other Clinician: Referring Provider: Treating Provider/Extender: Adele Schilder in Treatment: 4 History of Present Illness HPI Description: 01/17/2020 upon evaluation today patient presents for initial evaluation here in our clinic concerning issues she has been having with a left medial/plantar foot ulcer. This is actually been an issue for her since October 2020. She has been seeing Dr. Doran Durand for quite some time during that course. Fortunately there is no signs of active infection at this time. Or least no mention of this to have seen in general. With that being said unfortunately I do see some signs of erythema noted today that does have me concerned about the possibility of infection at this point in the surrounding area of the wound. There is also a warm to touch at the site which is somewhat concerning. Fortunately there is no evidence of systemic infection which is great news. The patient  does have a history of diabetes mellitus type 2, Charcot foot which is what led to the wound, and hypertension. She notes that she was in a cast for some time with Dr. Doran Durand for about 8 weeks. During that time they were utilizing according to the patient silver nitrate along with a foam doughnut and then Coban to secure in place in the cast in place. With that being said I do not have the actual records to review we are going to try to get a hold of those unfortunately they would not flow over into care everywhere I did look today. She has been seeing Dr. Doran Durand and his physician assistant Larkin Ina up until the end of May and apparently is still seeing them on a regular basis every 2 weeks roughly. She has also tried Iodosorb without effect here. 01/24/2020 upon evaluation today patient actually appears to be doing quite well with regard to her wounds. She has been tolerating the dressing changes without complication. Fortunately there is no signs of active infection spreading which is good news. Her culture did show signs of Staph aureus I did place her on Augmentin due to the erythema surrounding the wound. With that being said the wound does appear to be doing better she has her longer walking cast/boot and I think that is actually good for her for the time being. I am considering reinitiating total contact cast when she gets back from vacation but next week she will actually be out of town at [employer] she knows not to get in the water but she still obviously is planning to enjoy herself she is going to take it easy on her foot however. 02/07/2020 upon evaluation today patient appears to be doing fairly well in regard to her ulcer on her foot. Fortunately there is no  signs of severe infection at this time which is great news and overall very pleased in that regard. With that being said I do think that she could still benefit from a total contact cast. Nonetheless she is using her walking boot which at  least provide some protection and that it prevents some of the friction occurring when she is ambulating. 02/14/2020 upon evaluation today patient appears to be doing well with regard to her foot ulcer. This is actually measuring a little bit smaller yet again this week. Overall very pleased with where things stand and I do not see any signs of active infection at this time which is also good news. Since she is measuring better the patient has wanting to somewhat hold off on proceeding with the total contact cast which I think is reasonable at this point. 02/28/2020 on evaluation today patient appears to be doing well in general in regard to her wound although she has a lot of callus buildup as compared to last time I saw her. This is can require sharp debridement today. I do believe she really needs the total contact cast as well which we have discussed previous. 7/23; patient comes in for a total contact cast change 03/06/2020 on evaluation today patient appears to be doing quite well with regard to her wounds. Fortunately the wound bed is measuring smaller and looking much better there is little callus noted although there is some debridement necessary today. 03/13/2020 on evaluation today patient's wound actually appears to be doing excellent which is great news. With that being said unfortunately she is having some issues currently with her left leg where she does have cellulitis it appears. This may have come from an area that rubbed underneath the cast from last week that we noted we padded that area and it looks to be doing excellent at this point but nonetheless the leg was somewhat painful, swollen, and somewhat erythematous. She also had an elevated white blood cell count of 11.5 based on what I saw on looking at her records from the med center in [facility] from where she was seen yesterday. Unfortunately with [place] having a provider on vacation there was no one here in the clinic in the afternoon  when she called therefore she went to the ER as advised. Subsequently they did not cut off the cast as they did not have anyone from orthopedics there to do so and subsequently also did not have the ability to do the Doppler for evaluation of DVT They recommended therefore given her dose of Eliquis as well as . Augmentin and sent her home to come see [place] today to have the cast taken off and then she is supposed to go back to have the study for DVT performed they are following. 03/20/2020 upon evaluation today patient appears to be doing well with regard to her foot all things considered we have not been able to use the total contact cast due to the infection that she had last week. She has been on the doxycycline and she had a 10-day supply of that I do believe that is helping and her leg appears to be doing better. With that being said there is fortunately no signs of active infection systemically at this time which is good news. No fevers, chills, nausea, vomiting, or diarrhea. 03/27/2020 upon evaluation today patient appears to be doing well with regard to her foot ulcer. There does not appear to be signs of active infection which is great news.  Overall I am very pleased with where things stand at this point. 04/03/2020 upon evaluation today patient appears to be doing pretty well in regard to the overall appearance of her wound. Fortunately there is no signs of active infection at this time which is great news. No fevers, chills, nausea, vomiting, or diarrhea. With that being said she does have some blue-green drainage that actually is a little bit concerning to me for the possibility of Pseudomonas. I discussed that with the patient today. With that being said I do believe that we may be able to manage this however with the topical antibiotic cream as opposed to having to do anything oral especially since she seems to be doing so well with overall appearance of the wound. 04/10/2020 on evaluation  today patient appears to be doing about the same roughly in regard to the overall size of her wound. With that being said she fortunately has not shown any signs of worsening overall which is good news. I do believe that she is doing a great job trying to offload but again she may still do better with the cast. I do not see in the blue-green drainage that we noticed previously I do believe the gentamicin help in this regard. 04/17/2020 on evaluation today patient's wound appears to be doing about the same at this point. There is no significant improvement at this point. No fever chills noted. She is up for put the cast back on the day. That she states in a couple weeks she will need to have this off to go to a workshop. 04/24/2020 on evaluation today patient appears to be doing significantly better in regard to her wound. Fortunately there is no signs of active infection and overall feel like she is making great progress the cast seems to have done excellent for her. 05/01/2020 upon evaluation today patient presents for reevaluation she really does not appear to be doing too badly in regard to the actual wound on the left foot we have been managing. Unfortunately she has bilateral lower extremity edema with blisters between the webspace of her first and second toe on both feet. She has a tremendous amount of edema in the legs which I think is where this is coming from it does not appear to be infected but nonetheless I do believe this is can be something that needs to be addressed today. Obviously this means we probably will not be putting the cast on at this point. She attributes this to the fact that she was sitting with her feet on the floor much longer during a conference last week she had a great time but unfortunately had a lot of complications as a result. 05/08/2020 upon evaluation today patient appears to be doing somewhat better in regard to her wounds at this time. Fortunately there is no signs of  active infection which is great news. With that being said I do believe that the blisters have ruptured and unfortunately did not just reattach I will remove some of the blistered tissue today. With that being said I do think the wound itself on the plantar aspect of left foot does need to have sharp debridement. 05/15/2020 upon evaluation today patient appears to be doing about the same in regard to her foot ulcer. Unfortunately in the past week her husband had a fall where he sustained a mild traumatic brain bleed. Fortunately he is doing better but being that he was in the hospital she had a walk on this a  lot more. The wound does not appear to be any better is also not really appearing to be significantly worse which is good news. There is no signs of active infection at this time. 10/14; patient with a small diabetic wound on the medial part of her left foot. We have been using silver collagen a total contact cast making good progress. I think the patient had a series of blisters on her dorsal foot probably secondary to having her legs recumbent for 3 days while in a conference in [place]. We wrapped her leg last week these are all healed. We did not previously have her in compression on the right leg. 05/29/2020 upon evaluation today patient appears to be doing well with regard to the wound on the plantar aspect of her foot medially. This is measuring smaller and looking much better than last time I saw her. Again when I did see her last was 2 weeks back and the wound was significantly larger. I do believe the cast is helping and I believe the collagen is a good option for her. 06/05/2020 on evaluation today patient appears to be doing well with regard to her foot ulcer this is actually measuring significantly better and overall I feel like she is doing excellent. There is no signs of active infection at this time. 06/12/2020 upon evaluation today patient actually continues to show signs of good  improvement which is excellent news. I am extremely pleased with how she seems to be progressing at this point in regard to her wound. There is still some depth to the wound but I do believe the collagen is helping her quite a bit. 06/19/2020 upon evaluation today patient appears to be doing well with regard to her plantar foot ulcer. She is actually making excellent progress and in fact this appears to be almost completely healed. With that being said I do believe that the patient is going to actually require 1 more week in the cast although after that I am hopeful she will be ready for discharge. 06/26/2020 on evaluation today patient appears to be doing well in regard to her wound currently. Fortunately there is no signs of active infection in general I feel like she is doing excellent. This appears to be completely healed I think she is ready to come out of the cast. 11/29; patient comes back in the clinic today with a very quick reopening in the exact same area on the medial plantar foot. She had been healed out last time. She went back into some new balance shoes that she got at [employer] with a custom insert. As it turns out this wound also happened when wearing these shoes although there was some modification made I think with the wound initially happened they added some foam around the wound area. This obviously is not going to be sufficient. 07/17/2020 on evaluation today patient appears to be doing well with regard to her wound. Fortunately she seems to be making good progress. Unfortunately she was in the hospital due to a issue with colitis and had just been discharged today in fact. She tells me that her biggest concern here is that a lot of her numbers especially her creatinine were somewhat elevated and problematic. She is can be following up with her provider in order to have a further work-up at this time. With that being said she did need to come back in for a cast change. She did not  allow them to remove the cast due to the  fact that she did not feel like that was the issue whatsoever and indeed it does not appear that was the case her wound appears to be doing excellent today. 07/24/2020 upon evaluation today patient appears to be doing well in regard to her foot ulcer. She still has a small opening but this is showing signs of excellent improvement overall but they were very close to complete resolution. No fevers, chills, nausea, vomiting, or diarrhea. 07/31/2020 upon evaluation today patient actually appears to be doing excellent she is actually completely healed this is great news. Fortunately there is no signs of active infection at this time. No fevers, chills, nausea, vomiting, or diarrhea. The patient tells me that she did see Dr. Paulla Dolly in order to get to [place] so that she can have a custom shoe made. She saw him earlier last week. She does have an appointment with Liliane Channel on the sixth I believe on January she tells me Readmission: 08/28/2020 on evaluation today patient appears to be doing poorly in regard to her wound currently. She tells me this has reopened. The good news that she did get measured for and actually her shoes are on the way in from [facility].. With that being said she has been trying to stay off of this is much as possible using her wheelchair around home. Nonetheless has been somewhat difficult. The good news is her shoes should be here next week 09/04/2020 upon evaluation today patient appears to be doing well with regard to her wound. There is a little bit of callus buildup but nothing too significant she does tell me she was very active this week. Fortunately there is no signs of systemic infection at this point. The dorsal foot wound actually appears to be doing better. I think this is healed. 09/11/2020 upon evaluation today patient appears to actually be doing quite well in my opinion based on what I am seeing today. Fortunately there is no signs  of infection in fact her mother is certain that this is even open any longer based on what I see. Fortunately I think the patient has been doing everything she can to try to keep this under control. 09/18/2020 upon evaluation today patient appears to be doing about the same in regard to her foot ulcer. She has been tolerating the dressing changes without complication. Fortunately there is no sign of active infection at this time. No fevers, chills, nausea, vomiting, or diarrhea. She is going require some sharp debridement today. 09/25/2020 upon evaluation today patient appears to be doing well with regard to his wounds she has been tolerating the dressing changes without complication. Her wound appears to be completely healed based on what I am seeing at this point. There does not appear to be any signs of active infection at this time which is great news. No fevers, chills, nausea, vomiting, or diarrhea. She did get the cushion for her foot as well that she ordered to try to help keep pressure off of this area. That looks like it may be very beneficial for her to be honest. 10/02/2020 upon evaluation today patient appears to be doing more poorly in regard to her foot ulcer. Unfortunately this has reopened since we saw her last week. It apparently did not take too long at all for this to happen. She is obviously somewhat disappointed as she was hopeful that that would be time this thing would stay closed. Fortunately there is no evidence of active infection at this point. No fevers,  chills, nausea, vomiting, or diarrhea. With that being said I am good have to perform a little bit of sharp debridement clear away some of the debris currently. Including a minimal amount of callus at this time. 10/16/2020 upon evaluation today patient appears to be doing about the same in regard to her foot ulcer unfortunately. There does not appear to be any signs of active infection which is great news. No fever chills noted  she has been tolerating the dressing changes without complication which is great news. Unfortunately she tells me that she has been very depressed about the situation is getting very frustrating to her that she continues to have issues despite everything that she is trying to do to stay off of her foot. She is extremely discouraged and I do hate to hear this. Obviously we have been hopeful that if she got her shoes that would make a difference she is actually can be getting those tomorrow but I am not certain that that alone is good to be enough to get this to heal. We may end up having to consider going back into a total contact cast to get this to heal and then subsequently once we get her healed get her into her new diabetic shoes which will and my hope anyway keep this from reopening again. 10/23/2020 upon evaluation today patient appears to be doing a little worse both in regard to the size of her wound as well as in regard to the fact that she has erythema surrounding the wound and wrap around the lateral part of her foot where she tells me has been somewhat sore. Fortunately there does not appear to be any evidence of infection systemically but locally definitely there is erythema and warmth consistent with local cellulitis. I am glad to remove some of the callus as well. 10/30/2020 upon evaluation today patient appears to be doing well with regard to her wound on the plantar aspect of her foot. This is significantly improved compared to last week. 10/30/2020 upon evaluation today patient actually is making excellent progress her wound appears to show signs of great improvement which is wonderful and that she is extremely pleased to hear this. She actually leaves Sunday to go on her trip with her half sister and friends. 11/13/2020 on evaluation today patient's wound actually appears to be doing quite well. There does not appear to be any signs of infection it has been 2 weeks since have seen her  she does have a little bit of callus buildup here today but at the same time I do not believe the vacation time set her back any just has not really made a lot of significant improvement. She is done with her antibiotics at this point. 11/20/2020 upon evaluation today patient appears to be doing poorly in regard to her foot. In fact this appears to be showing signs of Infection. She has erythema and warmth that is concerning. I know she is very discouraged as this seems to be a recurrent issue. I think we may need to delve further into the possibility of something deeper going on here as far as a structural infection. 11/27/2020 patient presents for 1 week follow-up. She had a culture of her left foot ulcer that grew staph aureus sensitive to Augmentin. This was called in by [place], [place] this morning. She is currently on doxycycline for previous culture result. Patient also had an x-ray done of her left foot that had conflicting results. We asked for clarification and was  told we would have clarification on Monday. Patient states overall she is doing well. She tries to stay off of her foot is much as possible. 12/04/2020 patient presents for 1 week follow-up. She is currently taking Augmentin and tolerating this well. It was confirmed that her x-ray had no acute osseous abnormalities. We switched her to collagen from silver alginate last week. Patient is overall doing well and trying to stay off her foot is much as possible. She has no complaints today. 12/11/2020 upon evaluation today patient appears to be doing well with regard to her wound. She has been tolerating the dressing changes without complication. Fortunately there is no signs of active infection noted at this point. I think she is definitely ready to go back into the cast. 12/18/2020 upon evaluation today patient appears to be doing well with regard to her wound. She has been tolerating the dressing changes without complication. Fortunately there  is no signs of active infection at this time. No fevers, chills, nausea, vomiting, or diarrhea. 12/25/2020 upon evaluation today patient appears to be doing well in general in regard to her plantar foot ulcer. With that being said she tells me currently that there does not appear to be any signs of pain or problems which is great in fact the wound appears to be almost completely closed which is also excellent news. I do not see any signs of infection which is great news as well. No fevers, chills, nausea, vomiting, or diarrhea. 5/25; patient presents for 1 week follow-up. She has had the cast in place for the past week and has tolerated this well. She states she is getting orthotics on 5/10. She currently denies signs of infection and has no issues today. 01/08/2021 upon evaluation today patient appears unfortunately to be present today with issues that she has been having with her plantar foot wound. This apparently has reoccurred unfortunately and is causing quite a bit of discomfort and problems for her. There does not appear to be any signs of active infection which is great news and overall I am very pleased with where things stand in that regard but nonetheless I think she is definitely worried about where this can lead due to past history. She was just taken out of the cast and marked is healed last week. 01/15/2021 upon evaluation today patient unfortunately appears to be doing well but nonetheless continues to have issues with pressure and friction to her foot whenever she is not wearing the cast. Again today I think the wound is significantly improved and almost completely healed in 1 week's time after debridement last week. Nonetheless this has continued to be an issue for that reason we did do a MRI with contrast which really revealed no evidence whatsoever of an issue here. Again this is great but nonetheless still offers up the question what regularly to keep this from continuing to just reopen  every time that we see. Again I really do not know exactly what to do is I feel like this falls on the ground outside of wound care more into a issue more specifically with the orthopedic side of things. I understand the concern that a brace would probably be problematic for the patient. That makes complete sense to be honest. It would probably cause rubbing on other areas which would not be good. With that being said also I am not sure what else we can do other than just keeping her in the cast which is really not a feasible option  either. Every time I take her out of the cast this reopens almost immediately. 01/22/2021 upon evaluation today patient appears to be doing well with regard to her foot ulcer. This is going to require little bit of sharp debridement today. Fortunately there does not appear to be any signs of active infection at this time. No fevers, chills, nausea, vomiting, or diarrhea. I did previously last week tell the patient that I would contact Dr. Nona Dell office I did do so I spoke with Larkin Ina who is the physician assistant for Dr. Doran Durand. Subsequently he did relate to me that they really did not feel like there was anything that could be done differently for the patient. That is from an orthopedic standpoint. He said that the patient could make an appoint with Dr. Doran Durand to discuss further but he was not really sure that would make a big difference in the treatment plan to be honest. Subsequently he further did recommend that potentially the patient after I offered a thought on the potential for a Crow boot could benefit from this since she seems to do well with a total contact cast. With that being said he did advise that Allisonia would probably be the way to go for that if need be. 01/29/2021 upon evaluation today patient appears to be doing well with regard to her foot ulcer. In fact this pretty much appears to be healed. With that being said of course I am reluctant to absolutely  call it healed and just discharge her due to things we have had going on in the past. I think we still need to monitor and watch out for this for that reason I am going to ensure nothing is hiding or potentially get a reopen honest by bringing her back next week after using some foam over the area today. 02/05/2021 on evaluation today patient's wound actually appears to be completely closed. I do not see anything open on anything even threatened open. With that being said she did have drainage on her dressing it may appear that she had a small blister that occurred, rupture, and has resolved. Overall I do not see anything else concerning at this point. Electronic Signature(s) Signed: 02/05/2021 8:41:09 AM By: Worthy Keeler PA-C Entered By: Worthy Keeler on 02/05/2021 08:41:09 -------------------------------------------------------------------------------- Physical Exam Details Patient Name: Date of Service: Quraishi, [name] 02/05/2021 8:00 A M Medical Record Number: [id number] Patient Account Number: [id number] Date of Birth/Sex: Treating RN: Mar 24, 1946 (75 y.o. [name] Other Clinician: Referring Provider: Treating Provider/Extender: Landis Martins Weeks in Treatment: 4 Constitutional Well-nourished and well-hydrated in no acute distress. Respiratory normal breathing without difficulty. Psychiatric this patient is able to make decisions and demonstrates good insight into disease process. Alert and Oriented x 3. pleasant and cooperative. Notes Upon inspection patient's wound bed actually showed signs of good granulation epithelization at this point. There does not appear to be any evidence of opening which is great news. In fact I do not see any granular tissue due to the fact that again the patient is actually completely closed. Electronic Signature(s) Signed: 02/05/2021 8:41:31 AM By: Worthy Keeler PA-C Entered By: Worthy Keeler on 02/05/2021 08:41:30 -------------------------------------------------------------------------------- Physician Orders Details Patient Name: Date of Service: [place], [place] RBA RA E. 02/05/2021 8:00 A M Medical Record Number: [id number] Patient Account Number: [id number] Date of Birth/Sex: Treating RN: November 04, 1945 (75 y.o. [name] Other Clinician:  Referring Provider: Treating Provider/Extender: Adele Schilder in Treatment: 4 Verbal / Phone Orders: No Diagnosis Coding ICD-10 Coding Code Description E11.621 Type 2 diabetes mellitus with foot ulcer L97.522 Non-pressure chronic ulcer of other part of left foot with fat layer exposed M14.672 Charcot's joint, left ankle and foot E11.40 Type 2 diabetes mellitus with diabetic neuropathy, unspecified Follow-up Appointments Return Appointment in 1 week. Bathing/ Shower/ Hygiene May shower and wash wound with soap and water. Off-Loading Open toe surgical shoe to: - left foot at all times Additional Orders / Instructions Follow Nutritious Diet Wound Treatment Wound #[id number] - Foot Wound Laterality: Plantar, Left, Medial Secondary Dressing: Woven Gauze Sponges 2x2 in 3 x Per Week/7 Days Discharge Instructions: Apply over primary dressing as directed. Secondary Dressing: Optifoam Non-Adhesive Dressing, 4x4 in 3 x Per Week/7 Days Discharge Instructions: Apply over primary dressing cut to form foam donut Secured With: 65M Medipore H Soft Cloth Surgical T 4 x 2 (in/yd) 3 x Per Week/7 Days ape Discharge Instructions: Secure dressing with tape as directed. Electronic Signature(s) Signed: 02/05/2021 4:36:36 PM By: Worthy Keeler PA-C Signed: 02/05/2021 5:41:46 PM By: [name] RN, BSN Entered By: [name] on 02/05/2021 08:34:47 -------------------------------------------------------------------------------- Problem List Details Patient Name: Date of Service: [name] 02/05/2021 8:00 A M Medical Record Number: [id number] Patient Account Number: [id number] Date of Birth/Sex: Treating RN: September 20, 1945 (75 y.o. [name] Other Clinician: Referring Provider: Treating Provider/Extender: Landis Martins Weeks in Treatment: 4 Active Problems ICD-10 Encounter Code Description Active Date MDM Diagnosis E11.621 Type 2 diabetes mellitus with foot ulcer 01/08/2021 No Yes L97.522 Non-pressure chronic ulcer of other part of left foot with fat layer exposed 01/08/2021 No Yes M14.672 Charcot's joint, left ankle and foot 01/08/2021 No Yes E11.40 Type 2 diabetes mellitus with diabetic neuropathy, unspecified 01/08/2021 No Yes Inactive Problems Resolved Problems Electronic Signature(s) Signed: 02/05/2021 8:19:43 AM By: Worthy Keeler PA-C Entered By: Worthy Keeler on 02/05/2021 08:19:42 -------------------------------------------------------------------------------- Progress Note Details Patient Name: Date of Service: Neuman, [name] 02/05/2021 8:00 A M Medical Record Number: [id number] Patient Account Number: [id number] Date of Birth/Sex: Treating RN: [birth year] (75 y.o. [name] Other Clinician: Referring Provider: Treating Provider/Extender: Adele Schilder in Treatment: 4 Subjective Chief Complaint Information obtained from Patient Left foot ulcer History of Present Illness (HPI) 01/17/2020 upon evaluation today patient presents for initial evaluation here in our clinic concerning issues she has been having with a left medial/plantar foot ulcer. This is actually been an issue for her since October 2020. She has been seeing Dr. Doran Durand for quite some time during that course. Fortunately there is no signs of active infection at this time. Or least no mention of this to have seen in general. With that being said unfortunately I do  see some signs of erythema noted today that does have me concerned about the possibility of infection at this point in the surrounding area of the wound. There is also a warm to touch at the site which is somewhat concerning. Fortunately there is no evidence of systemic infection which is great news. The patient does have a history of diabetes mellitus type 2, Charcot foot which is what led to the wound, and hypertension. She notes that she was in a cast for some time with Dr. Doran Durand for about 8 weeks. During that time they were utilizing according  to the patient silver nitrate along with a foam doughnut and then Coban to secure in place in the cast in place. With that being said I do not have the actual records to review we are going to try to get a hold of those unfortunately they would not flow over into care everywhere I did look today. She has been seeing Dr. Doran Durand and his physician assistant Larkin Ina up until the end of May and apparently is still seeing them on a regular basis every 2 weeks roughly. She has also tried Iodosorb without effect here. 01/24/2020 upon evaluation today patient actually appears to be doing quite well with regard to her wounds. She has been tolerating the dressing changes without complication. Fortunately there is no signs of active infection spreading which is good news. Her culture did show signs of Staph aureus I did place her on Augmentin due to the erythema surrounding the wound. With that being said the wound does appear to be doing better she has her longer walking cast/boot and I think that is actually good for her for the time being. I am considering reinitiating total contact cast when she gets back from vacation but next week she will actually be out of town at [employer] she knows not to get in the water but she still obviously is planning to enjoy herself she is going to take it easy on her foot however. 02/07/2020 upon evaluation today patient appears to be  doing fairly well in regard to her ulcer on her foot. Fortunately there is no signs of severe infection at this time which is great news and overall very pleased in that regard. With that being said I do think that she could still benefit from a total contact cast. Nonetheless she is using her walking boot which at least provide some protection and that it prevents some of the friction occurring when she is ambulating. 02/14/2020 upon evaluation today patient appears to be doing well with regard to her foot ulcer. This is actually measuring a little bit smaller yet again this week. Overall very pleased with where things stand and I do not see any signs of active infection at this time which is also good news. Since she is measuring better the patient has wanting to somewhat hold off on proceeding with the total contact cast which I think is reasonable at this point. 02/28/2020 on evaluation today patient appears to be doing well in general in regard to her wound although she has a lot of callus buildup as compared to last time I saw her. This is can require sharp debridement today. I do believe she really needs the total contact cast as well which we have discussed previous. 7/23; patient comes in for a total contact cast change 03/06/2020 on evaluation today patient appears to be doing quite well with regard to her wounds. Fortunately the wound bed is measuring smaller and looking much better there is little callus noted although there is some debridement necessary today. 03/13/2020 on evaluation today patient's wound actually appears to be doing excellent which is great news. With that being said unfortunately she is having some issues currently with her left leg where she does have cellulitis it appears. This may have come from an area that rubbed underneath the cast from last week that we noted we padded that area and it looks to be doing excellent at this point but nonetheless the leg was somewhat  painful, swollen, and somewhat erythematous. She also had  an elevated white blood cell count of 11.5 based on what I saw on looking at her records from the med center in [facility] from where she was seen yesterday. Unfortunately with [place] having a provider on vacation there was no one here in the clinic in the afternoon when she called therefore she went to the ER as advised. Subsequently they did not cut off the cast as they did not have anyone from orthopedics there to do so and subsequently also did not have the ability to do the Doppler for evaluation of DVT They recommended therefore given her dose of Eliquis as well as . Augmentin and sent her home to come see [place] today to have the cast taken off and then she is supposed to go back to have the study for DVT performed they are following. 03/20/2020 upon evaluation today patient appears to be doing well with regard to her foot all things considered we have not been able to use the total contact cast due to the infection that she had last week. She has been on the doxycycline and she had a 10-day supply of that I do believe that is helping and her leg appears to be doing better. With that being said there is fortunately no signs of active infection systemically at this time which is good news. No fevers, chills, nausea, vomiting, or diarrhea. 03/27/2020 upon evaluation today patient appears to be doing well with regard to her foot ulcer. There does not appear to be signs of active infection which is great news. Overall I am very pleased with where things stand at this point. 04/03/2020 upon evaluation today patient appears to be doing pretty well in regard to the overall appearance of her wound. Fortunately there is no signs of active infection at this time which is great news. No fevers, chills, nausea, vomiting, or diarrhea. With that being said she does have some blue-green drainage that actually is a little bit concerning to me for the possibility  of Pseudomonas. I discussed that with the patient today. With that being said I do believe that we may be able to manage this however with the topical antibiotic cream as opposed to having to do anything oral especially since she seems to be doing so well with overall appearance of the wound. 04/10/2020 on evaluation today patient appears to be doing about the same roughly in regard to the overall size of her wound. With that being said she fortunately has not shown any signs of worsening overall which is good news. I do believe that she is doing a great job trying to offload but again she may still do better with the cast. I do not see in the blue-green drainage that we noticed previously I do believe the gentamicin help in this regard. 04/17/2020 on evaluation today patient's wound appears to be doing about the same at this point. There is no significant improvement at this point. No fever chills noted. She is up for put the cast back on the day. That she states in a couple weeks she will need to have this off to go to a workshop. 04/24/2020 on evaluation today patient appears to be doing significantly better in regard to her wound. Fortunately there is no signs of active infection and overall feel like she is making great progress the cast seems to have done excellent for her. 05/01/2020 upon evaluation today patient presents for reevaluation she really does not appear to be doing too badly in regard  to the actual wound on the left foot we have been managing. Unfortunately she has bilateral lower extremity edema with blisters between the webspace of her first and second toe on both feet. She has a tremendous amount of edema in the legs which I think is where this is coming from it does not appear to be infected but nonetheless I do believe this is can be something that needs to be addressed today. Obviously this means we probably will not be putting the cast on at this point. She attributes this to the  fact that she was sitting with her feet on the floor much longer during a conference last week she had a great time but unfortunately had a lot of complications as a result. 05/08/2020 upon evaluation today patient appears to be doing somewhat better in regard to her wounds at this time. Fortunately there is no signs of active infection which is great news. With that being said I do believe that the blisters have ruptured and unfortunately did not just reattach I will remove some of the blistered tissue today. With that being said I do think the wound itself on the plantar aspect of left foot does need to have sharp debridement. 05/15/2020 upon evaluation today patient appears to be doing about the same in regard to her foot ulcer. Unfortunately in the past week her husband had a fall where he sustained a mild traumatic brain bleed. Fortunately he is doing better but being that he was in the hospital she had a walk on this a lot more. The wound does not appear to be any better is also not really appearing to be significantly worse which is good news. There is no signs of active infection at this time. 10/14; patient with a small diabetic wound on the medial part of her left foot. We have been using silver collagen a total contact cast making good progress. I think the patient had a series of blisters on her dorsal foot probably secondary to having her legs recumbent for 3 days while in a conference in [place]. We wrapped her leg last week these are all healed. We did not previously have her in compression on the right leg. 05/29/2020 upon evaluation today patient appears to be doing well with regard to the wound on the plantar aspect of her foot medially. This is measuring smaller and looking much better than last time I saw her. Again when I did see her last was 2 weeks back and the wound was significantly larger. I do believe the cast is helping and I believe the collagen is a good option for  her. 06/05/2020 on evaluation today patient appears to be doing well with regard to her foot ulcer this is actually measuring significantly better and overall I feel like she is doing excellent. There is no signs of active infection at this time. 06/12/2020 upon evaluation today patient actually continues to show signs of good improvement which is excellent news. I am extremely pleased with how she seems to be progressing at this point in regard to her wound. There is still some depth to the wound but I do believe the collagen is helping her quite a bit. 06/19/2020 upon evaluation today patient appears to be doing well with regard to her plantar foot ulcer. She is actually making excellent progress and in fact this appears to be almost completely healed. With that being said I do believe that the patient is going to actually require 1 more week  in the cast although after that I am hopeful she will be ready for discharge. 06/26/2020 on evaluation today patient appears to be doing well in regard to her wound currently. Fortunately there is no signs of active infection in general I feel like she is doing excellent. This appears to be completely healed I think she is ready to come out of the cast. 11/29; patient comes back in the clinic today with a very quick reopening in the exact same area on the medial plantar foot. She had been healed out last time. She went back into some new balance shoes that she got at [employer] with a custom insert. As it turns out this wound also happened when wearing these shoes although there was some modification made I think with the wound initially happened they added some foam around the wound area. This obviously is not going to be sufficient. 07/17/2020 on evaluation today patient appears to be doing well with regard to her wound. Fortunately she seems to be making good progress. Unfortunately she was in the hospital due to a issue with colitis and had just been discharged  today in fact. She tells me that her biggest concern here is that a lot of her numbers especially her creatinine were somewhat elevated and problematic. She is can be following up with her provider in order to have a further work-up at this time. With that being said she did need to come back in for a cast change. She did not allow them to remove the cast due to the fact that she did not feel like that was the issue whatsoever and indeed it does not appear that was the case her wound appears to be doing excellent today. 07/24/2020 upon evaluation today patient appears to be doing well in regard to her foot ulcer. She still has a small opening but this is showing signs of excellent improvement overall but they were very close to complete resolution. No fevers, chills, nausea, vomiting, or diarrhea. 07/31/2020 upon evaluation today patient actually appears to be doing excellent she is actually completely healed this is great news. Fortunately there is no signs of active infection at this time. No fevers, chills, nausea, vomiting, or diarrhea. The patient tells me that she did see Dr. Paulla Dolly in order to get to [place] so that she can have a custom shoe made. She saw him earlier last week. She does have an appointment with Liliane Channel on the sixth I believe on January she tells me Readmission: 08/28/2020 on evaluation today patient appears to be doing poorly in regard to her wound currently. She tells me this has reopened. The good news that she did get measured for and actually her shoes are on the way in from [facility].. With that being said she has been trying to stay off of this is much as possible using her wheelchair around home. Nonetheless has been somewhat difficult. The good news is her shoes should be here next week 09/04/2020 upon evaluation today patient appears to be doing well with regard to her wound. There is a little bit of callus buildup but nothing too significant she does tell me she was  very active this week. Fortunately there is no signs of systemic infection at this point. The dorsal foot wound actually appears to be doing better. I think this is healed. 09/11/2020 upon evaluation today patient appears to actually be doing quite well in my opinion based on what I am seeing today. Fortunately there is  no signs of infection in fact her mother is certain that this is even open any longer based on what I see. Fortunately I think the patient has been doing everything she can to try to keep this under control. 09/18/2020 upon evaluation today patient appears to be doing about the same in regard to her foot ulcer. She has been tolerating the dressing changes without complication. Fortunately there is no sign of active infection at this time. No fevers, chills, nausea, vomiting, or diarrhea. She is going require some sharp debridement today. 09/25/2020 upon evaluation today patient appears to be doing well with regard to his wounds she has been tolerating the dressing changes without complication. Her wound appears to be completely healed based on what I am seeing at this point. There does not appear to be any signs of active infection at this time which is great news. No fevers, chills, nausea, vomiting, or diarrhea. She did get the cushion for her foot as well that she ordered to try to help keep pressure off of this area. That looks like it may be very beneficial for her to be honest. 10/02/2020 upon evaluation today patient appears to be doing more poorly in regard to her foot ulcer. Unfortunately this has reopened since we saw her last week. It apparently did not take too long at all for this to happen. She is obviously somewhat disappointed as she was hopeful that that would be time this thing would stay closed. Fortunately there is no evidence of active infection at this point. No fevers, chills, nausea, vomiting, or diarrhea. With that being said I am good have to perform a little bit of  sharp debridement clear away some of the debris currently. Including a minimal amount of callus at this time. 10/16/2020 upon evaluation today patient appears to be doing about the same in regard to her foot ulcer unfortunately. There does not appear to be any signs of active infection which is great news. No fever chills noted she has been tolerating the dressing changes without complication which is great news. Unfortunately she tells me that she has been very depressed about the situation is getting very frustrating to her that she continues to have issues despite everything that she is trying to do to stay off of her foot. She is extremely discouraged and I do hate to hear this. Obviously we have been hopeful that if she got her shoes that would make a difference she is actually can be getting those tomorrow but I am not certain that that alone is good to be enough to get this to heal. We may end up having to consider going back into a total contact cast to get this to heal and then subsequently once we get her healed get her into her new diabetic shoes which will and my hope anyway keep this from reopening again. 10/23/2020 upon evaluation today patient appears to be doing a little worse both in regard to the size of her wound as well as in regard to the fact that she has erythema surrounding the wound and wrap around the lateral part of her foot where she tells me has been somewhat sore. Fortunately there does not appear to be any evidence of infection systemically but locally definitely there is erythema and warmth consistent with local cellulitis. I am glad to remove some of the callus as well. 10/30/2020 upon evaluation today patient appears to be doing well with regard to her wound on the plantar aspect of  her foot. This is significantly improved compared to last week. 10/30/2020 upon evaluation today patient actually is making excellent progress her wound appears to show signs of great  improvement which is wonderful and that she is extremely pleased to hear this. She actually leaves Sunday to go on her trip with her half sister and friends. 11/13/2020 on evaluation today patient's wound actually appears to be doing quite well. There does not appear to be any signs of infection it has been 2 weeks since have seen her she does have a little bit of callus buildup here today but at the same time I do not believe the vacation time set her back any just has not really made a lot of significant improvement. She is done with her antibiotics at this point. 11/20/2020 upon evaluation today patient appears to be doing poorly in regard to her foot. In fact this appears to be showing signs of Infection. She has erythema and warmth that is concerning. I know she is very discouraged as this seems to be a recurrent issue. I think we may need to delve further into the possibility of something deeper going on here as far as a structural infection. 11/27/2020 patient presents for 1 week follow-up. She had a culture of her left foot ulcer that grew staph aureus sensitive to Augmentin. This was called in by [place], [place] this morning. She is currently on doxycycline for previous culture result. Patient also had an x-ray done of her left foot that had conflicting results. We asked for clarification and was told we would have clarification on Monday. Patient states overall she is doing well. She tries to stay off of her foot is much as possible. 12/04/2020 patient presents for 1 week follow-up. She is currently taking Augmentin and tolerating this well. It was confirmed that her x-ray had no acute osseous abnormalities. We switched her to collagen from silver alginate last week. Patient is overall doing well and trying to stay off her foot is much as possible. She has no complaints today. 12/11/2020 upon evaluation today patient appears to be doing well with regard to her wound. She has been tolerating the dressing  changes without complication. Fortunately there is no signs of active infection noted at this point. I think she is definitely ready to go back into the cast. 12/18/2020 upon evaluation today patient appears to be doing well with regard to her wound. She has been tolerating the dressing changes without complication. Fortunately there is no signs of active infection at this time. No fevers, chills, nausea, vomiting, or diarrhea. 12/25/2020 upon evaluation today patient appears to be doing well in general in regard to her plantar foot ulcer. With that being said she tells me currently that there does not appear to be any signs of pain or problems which is great in fact the wound appears to be almost completely closed which is also excellent news. I do not see any signs of infection which is great news as well. No fevers, chills, nausea, vomiting, or diarrhea. 5/25; patient presents for 1 week follow-up. She has had the cast in place for the past week and has tolerated this well. She states she is getting orthotics on 5/10. She currently denies signs of infection and has no issues today. 01/08/2021 upon evaluation today patient appears unfortunately to be present today with issues that she has been having with her plantar foot wound. This apparently has reoccurred unfortunately and is causing quite a bit of discomfort and problems  for her. There does not appear to be any signs of active infection which is great news and overall I am very pleased with where things stand in that regard but nonetheless I think she is definitely worried about where this can lead due to past history. She was just taken out of the cast and marked is healed last week. 01/15/2021 upon evaluation today patient unfortunately appears to be doing well but nonetheless continues to have issues with pressure and friction to her foot whenever she is not wearing the cast. Again today I think the wound is significantly improved and almost  completely healed in 1 week's time after debridement last week. Nonetheless this has continued to be an issue for that reason we did do a MRI with contrast which really revealed no evidence whatsoever of an issue here. Again this is great but nonetheless still offers up the question what regularly to keep this from continuing to just reopen every time that we see. Again I really do not know exactly what to do is I feel like this falls on the ground outside of wound care more into a issue more specifically with the orthopedic side of things. I understand the concern that a brace would probably be problematic for the patient. That makes complete sense to be honest. It would probably cause rubbing on other areas which would not be good. With that being said also I am not sure what else we can do other than just keeping her in the cast which is really not a feasible option either. Every time I take her out of the cast this reopens almost immediately. 01/22/2021 upon evaluation today patient appears to be doing well with regard to her foot ulcer. This is going to require little bit of sharp debridement today. Fortunately there does not appear to be any signs of active infection at this time. No fevers, chills, nausea, vomiting, or diarrhea. I did previously last week tell the patient that I would contact Dr. Nona Dell office I did do so I spoke with Larkin Ina who is the physician assistant for Dr. Doran Durand. Subsequently he did relate to me that they really did not feel like there was anything that could be done differently for the patient. That is from an orthopedic standpoint. He said that the patient could make an appoint with Dr. Doran Durand to discuss further but he was not really sure that would make a big difference in the treatment plan to be honest. Subsequently he further did recommend that potentially the patient after I offered a thought on the potential for a Crow boot could benefit from this since she seems  to do well with a total contact cast. With that being said he did advise that [place] would probably be the way to go for that if need be. 01/29/2021 upon evaluation today patient appears to be doing well with regard to her foot ulcer. In fact this pretty much appears to be healed. With that being said of course I am reluctant to absolutely call it healed and just discharge her due to things we have had going on in the past. I think we still need to monitor and watch out for this for that reason I am going to ensure nothing is hiding or potentially get a reopen honest by bringing her back next week after using some foam over the area today. 02/05/2021 on evaluation today patient's wound actually appears to be completely closed. I do not see anything open on anything even  threatened open. With that being said she did have drainage on her dressing it may appear that she had a small blister that occurred, rupture, and has resolved. Overall I do not see anything else concerning at this point. Objective Constitutional Well-nourished and well-hydrated in no acute distress. Vitals Time Taken: 8:15 AM, Temperature: 98.8 F, Pulse: 68 bpm, Respiratory Rate: 18 breaths/min, Blood Pressure: 150/83 mmHg. General Notes: has not checked blood sugar in several days Respiratory normal breathing without difficulty. Psychiatric this patient is able to make decisions and demonstrates good insight into disease process. Alert and Oriented x 3. pleasant and cooperative. General Notes: Upon inspection patient's wound bed actually showed signs of good granulation epithelization at this point. There does not appear to be any evidence of opening which is great news. In fact I do not see any granular tissue due to the fact that again the patient is actually completely closed. Integumentary (Hair, Skin) Wound #1RRRR status is Open. Original cause of wound was Gradually Appeared. The date acquired was: 05/11/2019. The wound  has been in treatment 55 weeks. The wound is located on the [place]. The wound measures 0cm length x 0cm width x 0cm depth; 0cm^2 area and 0cm^3 volume. There is no tunneling or undermining noted. There is a small amount of serosanguineous drainage noted. The wound margin is distinct with the outline attached to the wound base. There is no granulation within the wound bed. There is no necrotic tissue within the wound bed. Assessment Active Problems ICD-10 Type 2 diabetes mellitus with foot ulcer Non-pressure chronic ulcer of other part of left foot with fat layer exposed Charcot's joint, left ankle and foot Type 2 diabetes mellitus with diabetic neuropathy, unspecified Plan Follow-up Appointments: Return Appointment in 1 week. Bathing/ Shower/ Hygiene: May shower and wash wound with soap and water. Off-Loading: Open toe surgical shoe to: - left foot at all times Additional Orders / Instructions: Follow Nutritious Diet WOUND #1RRRR: - Foot Wound Laterality: Plantar, Left, Medial Secondary Dressing: Woven Gauze Sponges 2x2 in 3 x Per Week/7 Days Discharge Instructions: Apply over primary dressing as directed. Secondary Dressing: Optifoam Non-Adhesive Dressing, 4x4 in 3 x Per Week/7 Days Discharge Instructions: Apply over primary dressing cut to form foam donut Secured With: 60M Medipore H Soft Cloth Surgical T 4 x 2 (in/yd) 3 x Per Week/7 Days ape Discharge Instructions: Secure dressing with tape as directed. 1. Would recommend currently that we going continue with the wound care measures as before and the patient is in agreement with plan this includes a protective foam dressing currently. 2. I would recommend as well that we have the patient continue to monitor for anything that worsens. Obviously if she has any drainage or issues she should let me know. 3. I am also can recommend that we going to see Dr. Prudy Feeler on the 12th as well as Dr. Doran Durand on the 20th of both  good ideas to see what they have to say in regard to her foot and trying to prevent issues ongoing. She also has an appointment next week on the seventh right after I see her on Wednesday to have her shoes checked and for appropriate fitting. We will see patient back for reevaluation in 1 week here in the clinic. If anything worsens or changes patient will contact our office for additional recommendations. I Georgina Peer keep an eye on things in the meantime to make sure nothing worsens significantly. Electronic Signature(s) Signed: 02/05/2021 8:42:24 AM By: Worthy Keeler PA-C  Entered By: Worthy Keeler on 02/05/2021 08:42:23 -------------------------------------------------------------------------------- SuperBill Details Patient Name: Date of Service: [place]nel[place], [place] RBA RA E. 02/05/2021 Medical Record Number: [id number] Patient Account Number: [id number] Date of Birth/Sex: Treating RN: 01/05/1946 (75 y.o. [name] Other Clinician: Referring Provider: Treating Provider/Extender: Landis Martins Weeks in Treatment: 4 Diagnosis Coding ICD-10 Codes Code Description E11.621 Type 2 diabetes mellitus with foot ulcer L97.522 Non-pressure chronic ulcer of other part of left foot with fat layer exposed M14.672 Charcot's joint, left ankle and foot E11.40 Type 2 diabetes mellitus with diabetic neuropathy, unspecified Facility Procedures CPT4 Code: [id number] Description: 99213 - WOUND CARE VISIT-LEV 3 EST PT Modifier: Quantity: 1 Physician Procedures : CPT4 Code Description Modifier [id number] - WC PHYS LEVEL 4 - EST PT ICD-10 Diagnosis Description E11.621 Type 2 diabetes mellitus with foot ulcer L97.522 Non-pressure chronic ulcer of other part of left foot with fat layer exposed M14.672 Charcot's  joint, left ankle and foot E11.40 Type 2 diabetes mellitus with diabetic neuropathy, unspecified Quantity: 1 Electronic Signature(s) Signed:  02/05/2021 8:42:36 AM By: Worthy Keeler PA-C Entered By: Worthy Keeler on 02/05/2021 08:42:35

## 2021-02-06 ENCOUNTER — Encounter: Payer: Self-pay | Admitting: Internal Medicine

## 2021-02-07 ENCOUNTER — Other Ambulatory Visit: Payer: Self-pay

## 2021-02-07 MED ORDER — LEVEMIR FLEXTOUCH 100 UNIT/ML ~~LOC~~ SOPN: PEN_INJECTOR | SUBCUTANEOUS | 2 refills | Status: DC

## 2021-02-07 NOTE — Telephone Encounter (Signed)
Refilled medication

## 2021-02-11 NOTE — Progress Notes (Signed)
[name], [name] ([id number]) Visit Report for 02/05/2021 Arrival Information Details Patient Name: Date of Service: [name] [name], [name] [name] 02/05/2021 8:00 A M Medical Record Number: [id number] Patient Account Number: [id number] Date of Birth/Sex: Treating RN: 1946-07-02 (75 y.o. [name] [name] Primary Care : [name] [name] Other Clinician: Referring : Treating /Extender: [name] [name] in Treatment: 4 Visit Information History Since Last Visit Added or deleted any medications: No Patient Arrived: Wheel Chair Any new allergies or adverse reactions: No Arrival Time: 08:13 Had a fall or experienced change in No Accompanied By: self activities of daily living that may affect Transfer Assistance: None risk of falls: Patient Identification Verified: Yes Signs or symptoms of abuse/neglect since No Secondary Verification Process Completed: Yes last visito Patient Requires Transmission-Based Precautions: No Hospitalized since last visit: No Patient Has Alerts: No Implantable device outside of the clinic No excluding cellular tissue based products placed in the center since last visit: Has Dressing in Place as Prescribed: Yes Has Footwear/Offloading in Place as Yes Prescribed: Left: Surgical Shoe with Pressure Relief Insole Pain Present Now: No Electronic Signature(s) Signed: 02/05/2021 5:41:46 PM By: [name] Gouty RN, BSN Entered By: [name] [name] on 02/05/2021 08:14:57 -------------------------------------------------------------------------------- Clinic Level of Care Assessment Details Patient Name: Date of Service: [name] [name], [name] 02/05/2021 8:00 A M Medical Record Number: [id number] Patient Account Number: [id number] Date of Birth/Sex: Treating RN: 03/02/1946 (75 y.o. [name] [name], [name] [name] Primary Care : [name] [name] Other Clinician: Referring : Treating /Extender: [name] [name] in  Treatment: 4 Clinic Level of Care Assessment Items TOOL 4 Quantity Score [] - 0 Use when only an EandM is performed on FOLLOW-UP visit ASSESSMENTS - Nursing Assessment / Reassessment X- 1 10 Reassessment of Co-morbidities (includes updates in patient status) X- 1 5 Reassessment of Adherence to Treatment Plan ASSESSMENTS - Wound and Skin A ssessment / Reassessment X - Simple Wound Assessment / Reassessment - one wound 1 5 [] - 0 Complex Wound Assessment / Reassessment - multiple wounds [] - 0 Dermatologic / Skin Assessment (not related to wound area) ASSESSMENTS - Focused Assessment [] - 0 Circumferential Edema Measurements - multi extremities [] - 0 Nutritional Assessment / Counseling / Intervention X- 1 5 Lower Extremity Assessment (monofilament, tuning fork, pulses) [] - 0 Peripheral Arterial Disease Assessment (using hand held doppler) ASSESSMENTS - Ostomy and/or Continence Assessment and Care [] - 0 Incontinence Assessment and Management [] - 0 Ostomy Care Assessment and Management (repouching, etc.) PROCESS - Coordination of Care X - Simple Patient / Family Education for ongoing care 1 15 [] - 0 Complex (extensive) Patient / Family Education for ongoing care X- 1 10 Staff obtains Programmer, systems, Records, T Results / Process Orders est [] - 0 Staff telephones HHA, Nursing Homes / Clarify orders / etc [] - 0 Routine Transfer to another Facility (non-emergent condition) [] - 0 Routine Hospital Admission (non-emergent condition) [] - 0 New Admissions / Biomedical engineer / Ordering NPWT Apligraf, etc. , [] - 0 Emergency Hospital Admission (emergent condition) X- 1 10 Simple Discharge Coordination [] - 0 Complex (extensive) Discharge Coordination PROCESS - Special Needs [] - 0 Pediatric / Minor Patient Management [] - 0 Isolation Patient Management [] - 0 Hearing / Language / Visual special needs [] - 0 Assessment of Community assistance (transportation,  D/C planning, etc.) [] - 0 Additional assistance / Altered mentation [] - 0 Support Surface(s) Assessment (bed, cushion, seat, etc.) INTERVENTIONS - Wound Cleansing /  Measurement [] - 0 Simple Wound Cleansing - one wound [] - 0 Complex Wound Cleansing - multiple wounds X- 1 5 Wound Imaging (photographs - any number of wounds) [] - 0 Wound Tracing (instead of photographs) [] - 0 Simple Wound Measurement - one wound [] - 0 Complex Wound Measurement - multiple wounds INTERVENTIONS - Wound Dressings X - Small Wound Dressing one or multiple wounds 1 10 [] - 0 Medium Wound Dressing one or multiple wounds [] - 0 Large Wound Dressing one or multiple wounds [] - 0 Application of Medications - topical [] - 0 Application of Medications - injection INTERVENTIONS - Miscellaneous [] - 0 External ear exam [] - 0 Specimen Collection (cultures, biopsies, blood, body fluids, etc.) [] - 0 Specimen(s) / Culture(s) sent or taken to Lab for analysis [] - 0 Patient Transfer (multiple staff / Civil Service fast streamer / Similar devices) [] - 0 Simple Staple / Suture removal (25 or less) [] - 0 Complex Staple / Suture removal (26 or more) [] - 0 Hypo / Hyperglycemic Management (close monitor of Blood Glucose) [] - 0 Ankle / Brachial Index (ABI) - do not check if billed separately X- 1 5 Vital Signs Has the patient been seen at the hospital within the last three years: Yes Total Score: 80 Level Of Care: New/Established - Level 3 Electronic Signature(s) Signed: 02/05/2021 5:41:46 PM By: [name] Gouty RN, BSN Entered By: [name] [name] on 02/05/2021 08:33:00 -------------------------------------------------------------------------------- Encounter Discharge Information Details Patient Name: Date of Service: [name] Amato RA E. 02/05/2021 8:00 A M Medical Record Number: [id number] Patient Account Number: [id number] Date of Birth/Sex: Treating RN: [birth year] (75 y.o. [name] [name] Primary Care  [name] [name]: [name] [name] Other Clinician: Referring [name] [name]: Treating [name] [name]/Extender: [name] [name] in Treatment: 4 Encounter Discharge Information Items Discharge Condition: Stable Ambulatory Status: Wheelchair Discharge Destination: Home Transportation: Private Auto Accompanied By: spouse Schedule Follow-up Appointment: No Clinical Summary of Care: Patient Declined Electronic Signature(s) Signed: 02/11/2021 6:17:43 PM By: Leane Call Entered By: Leane Call on 02/05/2021 08:46:17 -------------------------------------------------------------------------------- Lower Extremity Assessment Details Patient Name: Date of Service: [name] Amato RA E. 02/05/2021 8:00 A M Medical Record Number: [id number] Patient Account Number: [id number] Date of Birth/Sex: Treating RN: 1946-05-16 (75 y.o. [name] [name] Primary Care Trendon Zaring: [name] [name] Other Clinician: Referring Blessed Girdner: Treating Trinitee Horgan/Extender: Landis Martins Weeks in Treatment: 4 Edema Assessment Assessed: [Left: No] [Right: No] Edema: [Left: N] [Right: o] Calf Left: Right: Point of Measurement: 32 cm From Medial Instep 34.5 cm Ankle Left: Right: Point of Measurement: 8 cm From Medial Instep 23 cm Vascular Assessment Pulses: Dorsalis Pedis Palpable: [Left:Yes] Electronic Signature(s) Signed: 02/05/2021 5:41:46 PM By: [name] Gouty RN, BSN Entered By: [name] [name] on 02/05/2021 08:17:40 -------------------------------------------------------------------------------- Multi-Disciplinary Care Plan Details Patient Name: Date of Service: [name] Amato RA E. 02/05/2021 8:00 A M Medical Record Number: [id number] Patient Account Number: [id number] Date of Birth/Sex: Treating RN: [birth year] (75 y.o. [name] [name] Primary Care Naleyah Ohlinger: [name] [name] Other Clinician: Referring Amauria Younts: Treating Dalene Robards/Extender: [name] [name] in Treatment:  4 Multidisciplinary Care Plan reviewed with physician Active Inactive Wound/Skin Impairment Nursing Diagnoses: Impaired tissue integrity Knowledge deficit related to ulceration/compromised skin integrity Goals: Patient/caregiver will verbalize understanding of skin care regimen Date Initiated: 01/08/2021 Target Resolution Date: 03/05/2021 Goal Status: Active Ulcer/skin breakdown will have a volume reduction of 30% by week 4 Date Initiated: 01/08/2021 Date Inactivated: 02/05/2021 Target Resolution Date: 02/05/2021 Goal Status: Met Ulcer/skin  breakdown will have a volume reduction of 50% by week 8 Date Initiated: 02/05/2021 Target Resolution Date: 03/05/2021 Goal Status: Active Interventions: Assess patient/caregiver ability to obtain necessary supplies Assess patient/caregiver ability to perform ulcer/skin care regimen upon admission and as needed Provide education on ulcer and skin care Treatment Activities: Skin care regimen initiated : 01/08/2021 Topical wound management initiated : 01/08/2021 Notes: Electronic Signature(s) Signed: 02/05/2021 5:41:46 PM By: [name] Gouty RN, BSN Entered By: [name] [name] on 02/05/2021 08:24:28 -------------------------------------------------------------------------------- Pain Assessment Details Patient Name: Date of Service: [name] Amato RA E. 02/05/2021 8:00 A M Medical Record Number: [id number] Patient Account Number: [id number] Date of Birth/Sex: Treating RN: 06-Nov-1945 (75 y.o. [name] [name] Primary Care : [name] [name] Other Clinician: Referring : Treating /Extender: [name] [name] in Treatment: 4 Active Problems Location of Pain Severity and Description of Pain Patient Has Paino No Site Locations Rate the pain. Current Pain Level: 0 Pain Management and Medication Current Pain Management: Electronic Signature(s) Signed: 02/05/2021 5:41:46 PM By: [name] Gouty RN, BSN Entered By:  [name] [name] on 02/05/2021 08:16:48 -------------------------------------------------------------------------------- Patient/Caregiver Education Details Patient Name: Date of Service: [name] Amato RA E. 6/29/2022andnbsp8:00 A M Medical Record Number: [id number] Patient Account Number: [id number] Date of Birth/Gender: Treating RN: 30-Dec-1945 (75 y.o. [name] [name] Primary Care Physician: [name] [name] Other Clinician: Referring Physician: Treating Physician/Extender: [name] [name] in Treatment: 4 Education Assessment Education Provided To: Patient Education Topics Provided Offloading: Methods: Explain/Verbal Responses: Reinforcements needed, State content correctly Wound/Skin Impairment: Methods: Explain/Verbal Responses: Reinforcements needed, State content correctly Electronic Signature(s) Signed: 02/05/2021 5:41:46 PM By: [name] Gouty RN, BSN Entered By: [name] [name] on 02/05/2021 08:24:50 -------------------------------------------------------------------------------- Wound Assessment Details Patient Name: Date of Service: [name] [name], [name] RA E. 02/05/2021 8:00 A M Medical Record Number: [id number] Patient Account Number: [id number] Date of Birth/Sex: Treating RN: 1946-05-05 (75 y.o. [name] [name] Primary Care : [name] [name] Other Clinician: Referring : Treating /Extender: Landis Martins Weeks in Treatment: 4 Wound Status Wound Number: [id number] Primary Diabetic Wound/Ulcer of the Lower Extremity Etiology: Wound Location: Left, Medial, Plantar Foot Wound Open Wounding Event: Gradually Appeared Status: Date Acquired: 05/11/2019 Comorbid Sleep Apnea, Deep Vein Thrombosis, Hypertension, Colitis, Type Weeks Of Treatment: 55 History: II Diabetes, Gout, Osteoarthritis, Neuropathy Clustered Wound: No Photos Wound Measurements Length: (cm) Width: (cm) Depth: (cm) Area: (cm) Volume: (cm) 0 %  Reduction in Area: 100% 0 % Reduction in Volume: 100% 0 Epithelialization: Large (67-100%) 0 Tunneling: No 0 Undermining: No Wound Description Classification: Grade 1 Wound Margin: Distinct, outline attached Exudate Amount: Small Exudate Type: Serosanguineous Exudate Color: red, brown Foul Odor After Cleansing: No Slough/Fibrino No Wound Bed Granulation Amount: None Present (0%) Exposed Structure Necrotic Amount: None Present (0%) Fascia Exposed: No Fat Layer (Subcutaneous Tissue) Exposed: No Tendon Exposed: No Muscle Exposed: No Joint Exposed: No Bone Exposed: No Treatment Notes Wound #[id number] (Foot) Wound Laterality: Plantar, Left, Medial Cleanser Peri-Wound Care Topical Primary Dressing Secondary Dressing Woven Gauze Sponges 2x2 in Discharge Instruction: Apply over primary dressing as directed. Optifoam Non-Adhesive Dressing, 4x4 in Discharge Instruction: Apply over primary dressing cut to form foam donut Secured With 6M Medipore H Soft Cloth Surgical T 4 x 2 (in/yd) ape Discharge Instruction: Secure dressing with tape as directed. Compression Wrap Compression Stockings Add-Ons Electronic Signature(s) Signed: 02/05/2021 5:02:46 PM By: Sandre Kitty Signed: 02/05/2021 5:41:46 PM By: [name] Gouty RN, BSN Entered By: Sandre Kitty on 02/05/2021 15:57:23 -------------------------------------------------------------------------------- Vitals Details Patient Name:  Date of Service: [name] [name], [name] E. 02/05/2021 8:00 A M Medical Record Number: [id number] Patient Account Number: [id number] Date of Birth/Sex: Treating RN: [birth year] (75 y.o. [name] [name] Primary Care : [name] [name] Other Clinician: Referring : Treating /Extender: Landis Martins Weeks in Treatment: 4 Vital Signs Time Taken: 08:15 Temperature (F): 98.8 Pulse (bpm): 68 Respiratory Rate (breaths/min): 18 Blood Pressure (mmHg): 150/83 Reference  Range: 80 - 120 mg / dl Notes has not checked blood sugar in several days Electronic Signature(s) Signed: 02/05/2021 5:41:46 PM By: [name] Gouty RN, BSN Entered By: [name] [name] on 02/05/2021 08:16:39

## 2021-02-12 ENCOUNTER — Encounter (HOSPITAL_BASED_OUTPATIENT_CLINIC_OR_DEPARTMENT_OTHER): Payer: Medicare Other | Attending: Physician Assistant | Admitting: Physician Assistant

## 2021-02-12 ENCOUNTER — Other Ambulatory Visit: Payer: Self-pay

## 2021-02-12 DIAGNOSIS — E11621 Type 2 diabetes mellitus with foot ulcer: Secondary | ICD-10-CM | POA: Insufficient documentation

## 2021-02-12 DIAGNOSIS — E1140 Type 2 diabetes mellitus with diabetic neuropathy, unspecified: Secondary | ICD-10-CM | POA: Diagnosis not present

## 2021-02-12 DIAGNOSIS — E1151 Type 2 diabetes mellitus with diabetic peripheral angiopathy without gangrene: Secondary | ICD-10-CM | POA: Diagnosis not present

## 2021-02-12 DIAGNOSIS — L97522 Non-pressure chronic ulcer of other part of left foot with fat layer exposed: Secondary | ICD-10-CM | POA: Insufficient documentation

## 2021-02-12 NOTE — Progress Notes (Addendum)
[name] [name] ([id number]) Visit Report for 02/12/2021 Chief Complaint Document Details Patient Name: Date of Service: [name] [name], [name] [name] 02/12/2021 8:00 A M Medical Record Number: [id number] Patient Account Number: [id number] Date of Birth/Sex: Treating RN: 12-May-1946 (75 y.o. [name] [name] Primary Care Provider: Billey [name] Other Clinician: Referring Provider: Treating Provider/Extender: [name] [name] in Treatment: 5 Information Obtained from: Patient Chief Complaint Left foot ulcer Electronic Signature(s) Signed: 02/12/2021 8:41:22 AM By: [name] Keeler PA-C Entered By: [name] [name] on 02/12/2021 08:41:22 -------------------------------------------------------------------------------- HPI Details Patient Name: Date of Service: Line, [name] 02/12/2021 8:00 A M Medical Record Number: [id number] Patient Account Number: [id number] Date of Birth/Sex: Treating RN: 1945-09-22 (75 y.o. [name] [name] Primary Care Provider: Billey [name] Other Clinician: Referring Provider: Treating Provider/Extender: [name] [name] in Treatment: 5 History of Present Illness HPI Description: 01/17/2020 upon evaluation today patient presents for initial evaluation here in our clinic concerning issues she has been having with a left medial/plantar foot ulcer. This is actually been an issue for her since October 2020. She has been seeing Dr. Doran [name] for quite some time during that course. Fortunately there is no signs of active infection at this time. Or least no mention of this to have seen in general. With that being said unfortunately I do see some signs of erythema noted today that does have me concerned about the possibility of infection at this point in the surrounding area of the wound. There is also a warm to touch at the site which is somewhat concerning. Fortunately there is no evidence of systemic infection which is great news. The patient does have  a history of diabetes mellitus type 2, Charcot foot which is what led to the wound, and hypertension. She notes that she was in a cast for some time with Dr. Doran [name] for about 8 weeks. During that time they were utilizing according to the patient silver nitrate along with a foam doughnut and then Coban to secure in place in the cast in place. With that being said I do not have the actual records to review we are going to try to get a hold of those unfortunately they would not flow over into care everywhere I did look today. She has been seeing Dr. Doran [name] and his physician assistant [name] [name] up until the end of May and apparently is still seeing them on a regular basis every 2 weeks roughly. She has also tried Iodosorb without effect here. 01/24/2020 upon evaluation today patient actually appears to be doing quite well with regard to her wounds. She has been tolerating the dressing changes without complication. Fortunately there is no signs of active infection spreading which is good news. Her culture did show signs of Staph aureus I did place her on Augmentin due to the erythema surrounding the wound. With that being said the wound does appear to be doing better she has her longer walking cast/boot and I think that is actually good for her for the time being. I am considering reinitiating total contact cast when she gets back from vacation but next week she will actually be out of town at [employer] she knows not to get in the water but she still obviously is planning to enjoy herself she is going to take it easy on her foot however. 02/07/2020 upon evaluation today patient appears to be doing fairly well in regard to her ulcer on her foot. Fortunately there is no  signs of severe infection at this time which is great news and overall very pleased in that regard. With that being said I do think that she could still benefit from a total contact cast. Nonetheless she is using her walking boot which at least  provide some protection and that it prevents some of the friction occurring when she is ambulating. 02/14/2020 upon evaluation today patient appears to be doing well with regard to her foot ulcer. This is actually measuring a little bit smaller yet again this week. Overall very pleased with where things stand and I do not see any signs of active infection at this time which is also good news. Since she is measuring better the patient has wanting to somewhat hold off on proceeding with the total contact cast which I think is reasonable at this point. 02/28/2020 on evaluation today patient appears to be doing well in general in regard to her wound although she has a lot of callus buildup as compared to last time I saw her. This is can require sharp debridement today. I do believe she really needs the total contact cast as well which we have discussed previous. 7/23; patient comes in for a total contact cast change 03/06/2020 on evaluation today patient appears to be doing quite well with regard to her wounds. Fortunately the wound bed is measuring smaller and looking much better there is little callus noted although there is some debridement necessary today. 03/13/2020 on evaluation today patient's wound actually appears to be doing excellent which is great news. With that being said unfortunately she is having some issues currently with her left leg where she does have cellulitis it appears. This may have come from an area that rubbed underneath the cast from last week that we noted we padded that area and it looks to be doing excellent at this point but nonetheless the leg was somewhat painful, swollen, and somewhat erythematous. She also had an elevated white blood cell count of 11.5 based on what I saw on looking at her records from the med center in [facility] from where she was seen yesterday. Unfortunately with [place] having a provider on vacation there was no one here in the clinic in the afternoon when she  called therefore she went to the ER as advised. Subsequently they did not cut off the cast as they did not have anyone from orthopedics there to do so and subsequently also did not have the ability to do the Doppler for evaluation of DVT They recommended therefore given her dose of Eliquis as well as . Augmentin and sent her home to come see [place] today to have the cast taken off and then she is supposed to go back to have the study for DVT performed they are following. 03/20/2020 upon evaluation today patient appears to be doing well with regard to her foot all things considered we have not been able to use the total contact cast due to the infection that she had last week. She has been on the doxycycline and she had a 10-day supply of that I do believe that is helping and her leg appears to be doing better. With that being said there is fortunately no signs of active infection systemically at this time which is good news. No fevers, chills, nausea, vomiting, or diarrhea. 03/27/2020 upon evaluation today patient appears to be doing well with regard to her foot ulcer. There does not appear to be signs of active infection which is great news.  Overall I am very pleased with where things stand at this point. 04/03/2020 upon evaluation today patient appears to be doing pretty well in regard to the overall appearance of her wound. Fortunately there is no signs of active infection at this time which is great news. No fevers, chills, nausea, vomiting, or diarrhea. With that being said she does have some blue-green drainage that actually is a little bit concerning to me for the possibility of Pseudomonas. I discussed that with the patient today. With that being said I do believe that we may be able to manage this however with the topical antibiotic cream as opposed to having to do anything oral especially since she seems to be doing so well with overall appearance of the wound. 04/10/2020 on evaluation today  patient appears to be doing about the same roughly in regard to the overall size of her wound. With that being said she fortunately has not shown any signs of worsening overall which is good news. I do believe that she is doing a great job trying to offload but again she may still do better with the cast. I do not see in the blue-green drainage that we noticed previously I do believe the gentamicin help in this regard. 04/17/2020 on evaluation today patient's wound appears to be doing about the same at this point. There is no significant improvement at this point. No fever chills noted. She is up for put the cast back on the day. That she states in a couple weeks she will need to have this off to go to a workshop. 04/24/2020 on evaluation today patient appears to be doing significantly better in regard to her wound. Fortunately there is no signs of active infection and overall feel like she is making great progress the cast seems to have done excellent for her. 05/01/2020 upon evaluation today patient presents for reevaluation she really does not appear to be doing too badly in regard to the actual wound on the left foot we have been managing. Unfortunately she has bilateral lower extremity edema with blisters between the webspace of her first and second toe on both feet. She has a tremendous amount of edema in the legs which I think is where this is coming from it does not appear to be infected but nonetheless I do believe this is can be something that needs to be addressed today. Obviously this means we probably will not be putting the cast on at this point. She attributes this to the fact that she was sitting with her feet on the floor much longer during a conference last week she had a great time but unfortunately had a lot of complications as a result. 05/08/2020 upon evaluation today patient appears to be doing somewhat better in regard to her wounds at this time. Fortunately there is no signs of  active infection which is great news. With that being said I do believe that the blisters have ruptured and unfortunately did not just reattach I will remove some of the blistered tissue today. With that being said I do think the wound itself on the plantar aspect of left foot does need to have sharp debridement. 05/15/2020 upon evaluation today patient appears to be doing about the same in regard to her foot ulcer. Unfortunately in the past week her husband had a fall where he sustained a mild traumatic brain bleed. Fortunately he is doing better but being that he was in the hospital she had a walk on this a  lot more. The wound does not appear to be any better is also not really appearing to be significantly worse which is good news. There is no signs of active infection at this time. 10/14; patient with a small diabetic wound on the medial part of her left foot. We have been using silver collagen a total contact cast making good progress. I think the patient had a series of blisters on her dorsal foot probably secondary to having her legs recumbent for 3 days while in a conference in [place]. We wrapped her leg last week these are all healed. We did not previously have her in compression on the right leg. 05/29/2020 upon evaluation today patient appears to be doing well with regard to the wound on the plantar aspect of her foot medially. This is measuring smaller and looking much better than last time I saw her. Again when I did see her last was 2 weeks back and the wound was significantly larger. I do believe the cast is helping and I believe the collagen is a good option for her. 06/05/2020 on evaluation today patient appears to be doing well with regard to her foot ulcer this is actually measuring significantly better and overall I feel like she is doing excellent. There is no signs of active infection at this time. 06/12/2020 upon evaluation today patient actually continues to show signs of good  improvement which is excellent news. I am extremely pleased with how she seems to be progressing at this point in regard to her wound. There is still some depth to the wound but I do believe the collagen is helping her quite a bit. 06/19/2020 upon evaluation today patient appears to be doing well with regard to her plantar foot ulcer. She is actually making excellent progress and in fact this appears to be almost completely healed. With that being said I do believe that the patient is going to actually require 1 more week in the cast although after that I am hopeful she will be ready for discharge. 06/26/2020 on evaluation today patient appears to be doing well in regard to her wound currently. Fortunately there is no signs of active infection in general I feel like she is doing excellent. This appears to be completely healed I think she is ready to come out of the cast. 11/29; patient comes back in the clinic today with a very quick reopening in the exact same area on the medial plantar foot. She had been healed out last time. She went back into some new balance shoes that she got at [employer] with a custom insert. As it turns out this wound also happened when wearing these shoes although there was some modification made I think with the wound initially happened they added some foam around the wound area. This obviously is not going to be sufficient. 07/17/2020 on evaluation today patient appears to be doing well with regard to her wound. Fortunately she seems to be making good progress. Unfortunately she was in the hospital due to a issue with colitis and had just been discharged today in fact. She tells me that her biggest concern here is that a lot of her numbers especially her creatinine were somewhat elevated and problematic. She is can be following up with her provider in order to have a further work-up at this time. With that being said she did need to come back in for a cast change. She did not  allow them to remove the cast due to the  fact that she did not feel like that was the issue whatsoever and indeed it does not appear that was the case her wound appears to be doing excellent today. 07/24/2020 upon evaluation today patient appears to be doing well in regard to her foot ulcer. She still has a small opening but this is showing signs of excellent improvement overall but they were very close to complete resolution. No fevers, chills, nausea, vomiting, or diarrhea. 07/31/2020 upon evaluation today patient actually appears to be doing excellent she is actually completely healed this is great news. Fortunately there is no signs of active infection at this time. No fevers, chills, nausea, vomiting, or diarrhea. The patient tells me that she did see Dr. Paulla Dolly in order to get to [place] so that she can have a custom shoe made. She saw him earlier last week. She does have an appointment with Liliane Channel on the sixth I believe on January she tells me Readmission: 08/28/2020 on evaluation today patient appears to be doing poorly in regard to her wound currently. She tells me this has reopened. The good news that she did get measured for and actually her shoes are on the way in from [facility].. With that being said she has been trying to stay off of this is much as possible using her wheelchair around home. Nonetheless has been somewhat difficult. The good news is her shoes should be here next week 09/04/2020 upon evaluation today patient appears to be doing well with regard to her wound. There is a little bit of callus buildup but nothing too significant she does tell me she was very active this week. Fortunately there is no signs of systemic infection at this point. The dorsal foot wound actually appears to be doing better. I think this is healed. 09/11/2020 upon evaluation today patient appears to actually be doing quite well in my opinion based on what I am seeing today. Fortunately there is no signs  of infection in fact her mother is certain that this is even open any longer based on what I see. Fortunately I think the patient has been doing everything she can to try to keep this under control. 09/18/2020 upon evaluation today patient appears to be doing about the same in regard to her foot ulcer. She has been tolerating the dressing changes without complication. Fortunately there is no sign of active infection at this time. No fevers, chills, nausea, vomiting, or diarrhea. She is going require some sharp debridement today. 09/25/2020 upon evaluation today patient appears to be doing well with regard to his wounds she has been tolerating the dressing changes without complication. Her wound appears to be completely healed based on what I am seeing at this point. There does not appear to be any signs of active infection at this time which is great news. No fevers, chills, nausea, vomiting, or diarrhea. She did get the cushion for her foot as well that she ordered to try to help keep pressure off of this area. That looks like it may be very beneficial for her to be honest. 10/02/2020 upon evaluation today patient appears to be doing more poorly in regard to her foot ulcer. Unfortunately this has reopened since we saw her last week. It apparently did not take too long at all for this to happen. She is obviously somewhat disappointed as she was hopeful that that would be time this thing would stay closed. Fortunately there is no evidence of active infection at this point. No fevers,  chills, nausea, vomiting, or diarrhea. With that being said I am good have to perform a little bit of sharp debridement clear away some of the debris currently. Including a minimal amount of callus at this time. 10/16/2020 upon evaluation today patient appears to be doing about the same in regard to her foot ulcer unfortunately. There does not appear to be any signs of active infection which is great news. No fever chills noted  she has been tolerating the dressing changes without complication which is great news. Unfortunately she tells me that she has been very depressed about the situation is getting very frustrating to her that she continues to have issues despite everything that she is trying to do to stay off of her foot. She is extremely discouraged and I do hate to hear this. Obviously we have been hopeful that if she got her shoes that would make a difference she is actually can be getting those tomorrow but I am not certain that that alone is good to be enough to get this to heal. We may end up having to consider going back into a total contact cast to get this to heal and then subsequently once we get her healed get her into her new diabetic shoes which will and my hope anyway keep this from reopening again. 10/23/2020 upon evaluation today patient appears to be doing a little worse both in regard to the size of her wound as well as in regard to the fact that she has erythema surrounding the wound and wrap around the lateral part of her foot where she tells me has been somewhat sore. Fortunately there does not appear to be any evidence of infection systemically but locally definitely there is erythema and warmth consistent with local cellulitis. I am glad to remove some of the callus as well. 10/30/2020 upon evaluation today patient appears to be doing well with regard to her wound on the plantar aspect of her foot. This is significantly improved compared to last week. 10/30/2020 upon evaluation today patient actually is making excellent progress her wound appears to show signs of great improvement which is wonderful and that she is extremely pleased to hear this. She actually leaves Sunday to go on her trip with her half sister and friends. 11/13/2020 on evaluation today patient's wound actually appears to be doing quite well. There does not appear to be any signs of infection it has been 2 weeks since have seen her  she does have a little bit of callus buildup here today but at the same time I do not believe the vacation time set her back any just has not really made a lot of significant improvement. She is done with her antibiotics at this point. 11/20/2020 upon evaluation today patient appears to be doing poorly in regard to her foot. In fact this appears to be showing signs of Infection. She has erythema and warmth that is concerning. I know she is very discouraged as this seems to be a recurrent issue. I think we may need to delve further into the possibility of something deeper going on here as far as a structural infection. 11/27/2020 patient presents for 1 week follow-up. She had a culture of her left foot ulcer that grew staph aureus sensitive to Augmentin. This was called in by [place], [place] this morning. She is currently on doxycycline for previous culture result. Patient also had an x-ray done of her left foot that had conflicting results. We asked for clarification and was  told we would have clarification on Monday. Patient states overall she is doing well. She tries to stay off of her foot is much as possible. 12/04/2020 patient presents for 1 week follow-up. She is currently taking Augmentin and tolerating this well. It was confirmed that her x-ray had no acute osseous abnormalities. We switched her to collagen from silver alginate last week. Patient is overall doing well and trying to stay off her foot is much as possible. She has no complaints today. 12/11/2020 upon evaluation today patient appears to be doing well with regard to her wound. She has been tolerating the dressing changes without complication. Fortunately there is no signs of active infection noted at this point. I think she is definitely ready to go back into the cast. 12/18/2020 upon evaluation today patient appears to be doing well with regard to her wound. She has been tolerating the dressing changes without complication. Fortunately there  is no signs of active infection at this time. No fevers, chills, nausea, vomiting, or diarrhea. 12/25/2020 upon evaluation today patient appears to be doing well in general in regard to her plantar foot ulcer. With that being said she tells me currently that there does not appear to be any signs of pain or problems which is great in fact the wound appears to be almost completely closed which is also excellent news. I do not see any signs of infection which is great news as well. No fevers, chills, nausea, vomiting, or diarrhea. 5/25; patient presents for 1 week follow-up. She has had the cast in place for the past week and has tolerated this well. She states she is getting orthotics on 5/10. She currently denies signs of infection and has no issues today. 01/08/2021 upon evaluation today patient appears unfortunately to be present today with issues that she has been having with her plantar foot wound. This apparently has reoccurred unfortunately and is causing quite a bit of discomfort and problems for her. There does not appear to be any signs of active infection which is great news and overall I am very pleased with where things stand in that regard but nonetheless I think she is definitely worried about where this can lead due to past history. She was just taken out of the cast and marked is healed last week. 01/15/2021 upon evaluation today patient unfortunately appears to be doing well but nonetheless continues to have issues with pressure and friction to her foot whenever she is not wearing the cast. Again today I think the wound is significantly improved and almost completely healed in 1 week's time after debridement last week. Nonetheless this has continued to be an issue for that reason we did do a MRI with contrast which really revealed no evidence whatsoever of an issue here. Again this is great but nonetheless still offers up the question what regularly to keep this from continuing to just reopen  every time that we see. Again I really do not know exactly what to do is I feel like this falls on the ground outside of wound care more into a issue more specifically with the orthopedic side of things. I understand the concern that a brace would probably be problematic for the patient. That makes complete sense to be honest. It would probably cause rubbing on other areas which would not be good. With that being said also I am not sure what else we can do other than just keeping her in the cast which is really not a feasible option  either. Every time I take her out of the cast this reopens almost immediately. 01/22/2021 upon evaluation today patient appears to be doing well with regard to her foot ulcer. This is going to require little bit of sharp debridement today. Fortunately there does not appear to be any signs of active infection at this time. No fevers, chills, nausea, vomiting, or diarrhea. I did previously last week tell the patient that I would contact Dr. Nona Dell office I did do so I spoke with [name] [name] who is the physician assistant for Dr. Doran [name]. Subsequently he did relate to me that they really did not feel like there was anything that could be done differently for the patient. That is from an orthopedic standpoint. He said that the patient could make an appoint with Dr. Doran [name] to discuss further but he was not really sure that would make a big difference in the treatment plan to be honest. Subsequently he further did recommend that potentially the patient after I offered a thought on the potential for a Crow boot could benefit from this since she seems to do well with a total contact cast. With that being said he did advise that Velva would probably be the way to go for that if need be. 01/29/2021 upon evaluation today patient appears to be doing well with regard to her foot ulcer. In fact this pretty much appears to be healed. With that being said of course I am reluctant to absolutely  call it healed and just discharge her due to things we have had going on in the past. I think we still need to monitor and watch out for this for that reason I am going to ensure nothing is hiding or potentially get a reopen honest by bringing her back next week after using some foam over the area today. 02/05/2021 on evaluation today patient's wound actually appears to be completely closed. I do not see anything open on anything even threatened open. With that being said she did have drainage on her dressing it may appear that she had a small blister that occurred, rupture, and has resolved. Overall I do not see anything else concerning at this point. 02/12/2021 upon evaluation today patient actually still continues to maintain closure as far as her wound is concerned this is actually long as she has been without anything reopening. She has been extremely careful with that however. At this point I think she does need to start slowly and carefully increasing her activity with her shoes wants to get checked tomorrow. She has her appointment with Dr. Prudy Feeler next Wednesday and the following Wednesday with Dr. Doran [name]. Electronic Signature(s) Signed: 02/12/2021 9:14:35 AM By: [name] Keeler PA-C Entered By: [name] [name] on 02/12/2021 09:14:35 -------------------------------------------------------------------------------- Physical Exam Details Patient Name: Date of Service: Gatley, [name] 02/12/2021 8:00 A M Medical Record Number: [id number] Patient Account Number: [id number] Date of Birth/Sex: Treating RN: 04/11/1946 (75 y.o. [name] [name] Primary Care Provider: Billey [name] Other Clinician: Referring Provider: Treating Provider/Extender: Landis Martins Weeks in Treatment: 5 Constitutional Well-nourished and well-hydrated in no acute distress. Respiratory normal breathing without difficulty. Psychiatric this patient is able to make decisions and demonstrates good insight  into disease process. Alert and Oriented x 3. pleasant and cooperative. Notes Upon inspection patient's wound again showed signs of complete epithelization there is no opening and no draining this is great news and overall I am extremely pleased with where things stand today. No fevers, chills, nausea,  vomiting, or diarrhea. Electronic Signature(s) Signed: 02/12/2021 9:14:50 AM By: [name] Keeler PA-C Entered By: [name] [name] on 02/12/2021 09:14:49 -------------------------------------------------------------------------------- Physician Orders Details Patient Name: Date of Service: Henandez, [name] 02/12/2021 8:00 A M Medical Record Number: [id number] Patient Account Number: [id number] Date of Birth/Sex: Treating RN: 1946/03/05 (75 y.o. [name] [name] Primary Care Provider: Billey [name] Other Clinician: Referring Provider: Treating Provider/Extender: [name] [name] in Treatment: 5 Verbal / Phone Orders: No Diagnosis Coding ICD-10 Coding Code Description E11.621 Type 2 diabetes mellitus with foot ulcer L97.522 Non-pressure chronic ulcer of other part of left foot with fat layer exposed M14.672 Charcot's joint, left ankle and foot E11.40 Type 2 diabetes mellitus with diabetic neuropathy, unspecified Follow-up Appointments Return appointment in 3 weeks. - with [facility] Shower/ Hygiene May shower and wash wound with soap and water. Off-Loading Open toe surgical shoe to: - left foot at all times Additional Orders / Instructions Follow Nutritious Diet Wound Treatment Wound #[id number] - Foot Wound Laterality: Plantar, Left, Medial Secondary Dressing: Woven Gauze Sponges 2x2 in 3 x Per Week/7 Days Discharge Instructions: Apply over primary dressing as directed. Secondary Dressing: Optifoam Non-Adhesive Dressing, 4x4 in 3 x Per Week/7 Days Discharge Instructions: Apply over primary dressing cut to form foam donut Secured With: 19M Medipore H Soft Cloth  Surgical T 4 x 2 (in/yd) 3 x Per Week/7 Days ape Discharge Instructions: Secure dressing with tape as directed. Electronic Signature(s) Signed: 02/12/2021 4:43:01 PM By: [name] Keeler PA-C Signed: 02/12/2021 6:17:15 PM By: [name] RN, BSN Entered By: [name] on 02/12/2021 09:10:04 -------------------------------------------------------------------------------- Problem List Details Patient Name: Date of Service: [name] 02/12/2021 8:00 A M Medical Record Number: [id number] Patient Account Number: [id number] Date of Birth/Sex: Treating RN: 05-Apr-1946 (75 y.o. [name] [name] Primary Care Provider: Billey [name] Other Clinician: Referring Provider: Treating Provider/Extender: Landis Martins Weeks in Treatment: 5 Active Problems ICD-10 Encounter Code Description Active Date MDM Diagnosis E11.621 Type 2 diabetes mellitus with foot ulcer 01/08/2021 No Yes L97.522 Non-pressure chronic ulcer of other part of left foot with fat layer exposed 01/08/2021 No Yes M14.672 Charcot's joint, left ankle and foot 01/08/2021 No Yes E11.40 Type 2 diabetes mellitus with diabetic neuropathy, unspecified 01/08/2021 No Yes Inactive Problems Resolved Problems Electronic Signature(s) Signed: 02/12/2021 8:41:15 AM By: [name] Keeler PA-C Entered By: [name] [name] on 02/12/2021 08:41:15 -------------------------------------------------------------------------------- Progress Note Details Patient Name: Date of Service: Stefano, [name] 02/12/2021 8:00 A M Medical Record Number: [id number] Patient Account Number: [id number] Date of Birth/Sex: Treating RN: Dec 02, 1945 (75 y.o. [name] [name] Primary Care Provider: Billey [name] Other Clinician: Referring Provider: Treating Provider/Extender: [name] [name] in Treatment: 5 Subjective Chief Complaint Information obtained from Patient Left foot ulcer History of Present Illness (HPI) 01/17/2020 upon  evaluation today patient presents for initial evaluation here in our clinic concerning issues she has been having with a left medial/plantar foot ulcer. This is actually been an issue for her since October 2020. She has been seeing Dr. Doran [name] for quite some time during that course. Fortunately there is no signs of active infection at this time. Or least no mention of this to have seen in general. With that being said unfortunately I do see some signs of erythema noted today that does have me concerned about the possibility of infection at this point in the surrounding area of the wound. There is also a  warm to touch at the site which is somewhat concerning. Fortunately there is no evidence of systemic infection which is great news. The patient does have a history of diabetes mellitus type 2, Charcot foot which is what led to the wound, and hypertension. She notes that she was in a cast for some time with Dr. Doran [name] for about 8 weeks. During that time they were utilizing according to the patient silver nitrate along with a foam doughnut and then Coban to secure in place in the cast in place. With that being said I do not have the actual records to review we are going to try to get a hold of those unfortunately they would not flow over into care everywhere I did look today. She has been seeing Dr. Doran [name] and his physician assistant [name] [name] up until the end of May and apparently is still seeing them on a regular basis every 2 weeks roughly. She has also tried Iodosorb without effect here. 01/24/2020 upon evaluation today patient actually appears to be doing quite well with regard to her wounds. She has been tolerating the dressing changes without complication. Fortunately there is no signs of active infection spreading which is good news. Her culture did show signs of Staph aureus I did place her on Augmentin due to the erythema surrounding the wound. With that being said the wound does appear to be doing  better she has her longer walking cast/boot and I think that is actually good for her for the time being. I am considering reinitiating total contact cast when she gets back from vacation but next week she will actually be out of town at [employer] she knows not to get in the water but she still obviously is planning to enjoy herself she is going to take it easy on her foot however. 02/07/2020 upon evaluation today patient appears to be doing fairly well in regard to her ulcer on her foot. Fortunately there is no signs of severe infection at this time which is great news and overall very pleased in that regard. With that being said I do think that she could still benefit from a total contact cast. Nonetheless she is using her walking boot which at least provide some protection and that it prevents some of the friction occurring when she is ambulating. 02/14/2020 upon evaluation today patient appears to be doing well with regard to her foot ulcer. This is actually measuring a little bit smaller yet again this week. Overall very pleased with where things stand and I do not see any signs of active infection at this time which is also good news. Since she is measuring better the patient has wanting to somewhat hold off on proceeding with the total contact cast which I think is reasonable at this point. 02/28/2020 on evaluation today patient appears to be doing well in general in regard to her wound although she has a lot of callus buildup as compared to last time I saw her. This is can require sharp debridement today. I do believe she really needs the total contact cast as well which we have discussed previous. 7/23; patient comes in for a total contact cast change 03/06/2020 on evaluation today patient appears to be doing quite well with regard to her wounds. Fortunately the wound bed is measuring smaller and looking much better there is little callus noted although there is some debridement necessary  today. 03/13/2020 on evaluation today patient's wound actually appears to be doing excellent which is great  news. With that being said unfortunately she is having some issues currently with her left leg where she does have cellulitis it appears. This may have come from an area that rubbed underneath the cast from last week that we noted we padded that area and it looks to be doing excellent at this point but nonetheless the leg was somewhat painful, swollen, and somewhat erythematous. She also had an elevated white blood cell count of 11.5 based on what I saw on looking at her records from the med center in [facility] from where she was seen yesterday. Unfortunately with [place] having a provider on vacation there was no one here in the clinic in the afternoon when she called therefore she went to the ER as advised. Subsequently they did not cut off the cast as they did not have anyone from orthopedics there to do so and subsequently also did not have the ability to do the Doppler for evaluation of DVT They recommended therefore given her dose of Eliquis as well as . Augmentin and sent her home to come see [place] today to have the cast taken off and then she is supposed to go back to have the study for DVT performed they are following. 03/20/2020 upon evaluation today patient appears to be doing well with regard to her foot all things considered we have not been able to use the total contact cast due to the infection that she had last week. She has been on the doxycycline and she had a 10-day supply of that I do believe that is helping and her leg appears to be doing better. With that being said there is fortunately no signs of active infection systemically at this time which is good news. No fevers, chills, nausea, vomiting, or diarrhea. 03/27/2020 upon evaluation today patient appears to be doing well with regard to her foot ulcer. There does not appear to be signs of active infection which is great news.  Overall I am very pleased with where things stand at this point. 04/03/2020 upon evaluation today patient appears to be doing pretty well in regard to the overall appearance of her wound. Fortunately there is no signs of active infection at this time which is great news. No fevers, chills, nausea, vomiting, or diarrhea. With that being said she does have some blue-green drainage that actually is a little bit concerning to me for the possibility of Pseudomonas. I discussed that with the patient today. With that being said I do believe that we may be able to manage this however with the topical antibiotic cream as opposed to having to do anything oral especially since she seems to be doing so well with overall appearance of the wound. 04/10/2020 on evaluation today patient appears to be doing about the same roughly in regard to the overall size of her wound. With that being said she fortunately has not shown any signs of worsening overall which is good news. I do believe that she is doing a great job trying to offload but again she may still do better with the cast. I do not see in the blue-green drainage that we noticed previously I do believe the gentamicin help in this regard. 04/17/2020 on evaluation today patient's wound appears to be doing about the same at this point. There is no significant improvement at this point. No fever chills noted. She is up for put the cast back on the day. That she states in a couple weeks she will need to have  this off to go to a workshop. 04/24/2020 on evaluation today patient appears to be doing significantly better in regard to her wound. Fortunately there is no signs of active infection and overall feel like she is making great progress the cast seems to have done excellent for her. 05/01/2020 upon evaluation today patient presents for reevaluation she really does not appear to be doing too badly in regard to the actual wound on the left foot we have been managing.  Unfortunately she has bilateral lower extremity edema with blisters between the webspace of her first and second toe on both feet. She has a tremendous amount of edema in the legs which I think is where this is coming from it does not appear to be infected but nonetheless I do believe this is can be something that needs to be addressed today. Obviously this means we probably will not be putting the cast on at this point. She attributes this to the fact that she was sitting with her feet on the floor much longer during a conference last week she had a great time but unfortunately had a lot of complications as a result. 05/08/2020 upon evaluation today patient appears to be doing somewhat better in regard to her wounds at this time. Fortunately there is no signs of active infection which is great news. With that being said I do believe that the blisters have ruptured and unfortunately did not just reattach I will remove some of the blistered tissue today. With that being said I do think the wound itself on the plantar aspect of left foot does need to have sharp debridement. 05/15/2020 upon evaluation today patient appears to be doing about the same in regard to her foot ulcer. Unfortunately in the past week her husband had a fall where he sustained a mild traumatic brain bleed. Fortunately he is doing better but being that he was in the hospital she had a walk on this a lot more. The wound does not appear to be any better is also not really appearing to be significantly worse which is good news. There is no signs of active infection at this time. 10/14; patient with a small diabetic wound on the medial part of her left foot. We have been using silver collagen a total contact cast making good progress. I think the patient had a series of blisters on her dorsal foot probably secondary to having her legs recumbent for 3 days while in a conference in [place]. We wrapped her leg last week these are all healed.  We did not previously have her in compression on the right leg. 05/29/2020 upon evaluation today patient appears to be doing well with regard to the wound on the plantar aspect of her foot medially. This is measuring smaller and looking much better than last time I saw her. Again when I did see her last was 2 weeks back and the wound was significantly larger. I do believe the cast is helping and I believe the collagen is a good option for her. 06/05/2020 on evaluation today patient appears to be doing well with regard to her foot ulcer this is actually measuring significantly better and overall I feel like she is doing excellent. There is no signs of active infection at this time. 06/12/2020 upon evaluation today patient actually continues to show signs of good improvement which is excellent news. I am extremely pleased with how she seems to be progressing at this point in regard to her wound. There is  still some depth to the wound but I do believe the collagen is helping her quite a bit. 06/19/2020 upon evaluation today patient appears to be doing well with regard to her plantar foot ulcer. She is actually making excellent progress and in fact this appears to be almost completely healed. With that being said I do believe that the patient is going to actually require 1 more week in the cast although after that I am hopeful she will be ready for discharge. 06/26/2020 on evaluation today patient appears to be doing well in regard to her wound currently. Fortunately there is no signs of active infection in general I feel like she is doing excellent. This appears to be completely healed I think she is ready to come out of the cast. 11/29; patient comes back in the clinic today with a very quick reopening in the exact same area on the medial plantar foot. She had been healed out last time. She went back into some new balance shoes that she got at [employer] with a custom insert. As it turns out this wound also  happened when wearing these shoes although there was some modification made I think with the wound initially happened they added some foam around the wound area. This obviously is not going to be sufficient. 07/17/2020 on evaluation today patient appears to be doing well with regard to her wound. Fortunately she seems to be making good progress. Unfortunately she was in the hospital due to a issue with colitis and had just been discharged today in fact. She tells me that her biggest concern here is that a lot of her numbers especially her creatinine were somewhat elevated and problematic. She is can be following up with her provider in order to have a further work-up at this time. With that being said she did need to come back in for a cast change. She did not allow them to remove the cast due to the fact that she did not feel like that was the issue whatsoever and indeed it does not appear that was the case her wound appears to be doing excellent today. 07/24/2020 upon evaluation today patient appears to be doing well in regard to her foot ulcer. She still has a small opening but this is showing signs of excellent improvement overall but they were very close to complete resolution. No fevers, chills, nausea, vomiting, or diarrhea. 07/31/2020 upon evaluation today patient actually appears to be doing excellent she is actually completely healed this is great news. Fortunately there is no signs of active infection at this time. No fevers, chills, nausea, vomiting, or diarrhea. The patient tells me that she did see Dr. Paulla Dolly in order to get to [place] so that she can have a custom shoe made. She saw him earlier last week. She does have an appointment with Liliane Channel on the sixth I believe on January she tells me Readmission: 08/28/2020 on evaluation today patient appears to be doing poorly in regard to her wound currently. She tells me this has reopened. The good news that she did get measured for and actually her  shoes are on the way in from [facility].. With that being said she has been trying to stay off of this is much as possible using her wheelchair around home. Nonetheless has been somewhat difficult. The good news is her shoes should be here next week 09/04/2020 upon evaluation today patient appears to be doing well with regard to her wound. There is a little  bit of callus buildup but nothing too significant she does tell me she was very active this week. Fortunately there is no signs of systemic infection at this point. The dorsal foot wound actually appears to be doing better. I think this is healed. 09/11/2020 upon evaluation today patient appears to actually be doing quite well in my opinion based on what I am seeing today. Fortunately there is no signs of infection in fact her mother is certain that this is even open any longer based on what I see. Fortunately I think the patient has been doing everything she can to try to keep this under control. 09/18/2020 upon evaluation today patient appears to be doing about the same in regard to her foot ulcer. She has been tolerating the dressing changes without complication. Fortunately there is no sign of active infection at this time. No fevers, chills, nausea, vomiting, or diarrhea. She is going require some sharp debridement today. 09/25/2020 upon evaluation today patient appears to be doing well with regard to his wounds she has been tolerating the dressing changes without complication. Her wound appears to be completely healed based on what I am seeing at this point. There does not appear to be any signs of active infection at this time which is great news. No fevers, chills, nausea, vomiting, or diarrhea. She did get the cushion for her foot as well that she ordered to try to help keep pressure off of this area. That looks like it may be very beneficial for her to be honest. 10/02/2020 upon evaluation today patient appears to be doing more poorly in  regard to her foot ulcer. Unfortunately this has reopened since we saw her last week. It apparently did not take too long at all for this to happen. She is obviously somewhat disappointed as she was hopeful that that would be time this thing would stay closed. Fortunately there is no evidence of active infection at this point. No fevers, chills, nausea, vomiting, or diarrhea. With that being said I am good have to perform a little bit of sharp debridement clear away some of the debris currently. Including a minimal amount of callus at this time. 10/16/2020 upon evaluation today patient appears to be doing about the same in regard to her foot ulcer unfortunately. There does not appear to be any signs of active infection which is great news. No fever chills noted she has been tolerating the dressing changes without complication which is great news. Unfortunately she tells me that she has been very depressed about the situation is getting very frustrating to her that she continues to have issues despite everything that she is trying to do to stay off of her foot. She is extremely discouraged and I do hate to hear this. Obviously we have been hopeful that if she got her shoes that would make a difference she is actually can be getting those tomorrow but I am not certain that that alone is good to be enough to get this to heal. We may end up having to consider going back into a total contact cast to get this to heal and then subsequently once we get her healed get her into her new diabetic shoes which will and my hope anyway keep this from reopening again. 10/23/2020 upon evaluation today patient appears to be doing a little worse both in regard to the size of her wound as well as in regard to the fact that she has erythema surrounding the wound and wrap around  the lateral part of her foot where she tells me has been somewhat sore. Fortunately there does not appear to be any evidence of infection systemically  but locally definitely there is erythema and warmth consistent with local cellulitis. I am glad to remove some of the callus as well. 10/30/2020 upon evaluation today patient appears to be doing well with regard to her wound on the plantar aspect of her foot. This is significantly improved compared to last week. 10/30/2020 upon evaluation today patient actually is making excellent progress her wound appears to show signs of great improvement which is wonderful and that she is extremely pleased to hear this. She actually leaves Sunday to go on her trip with her half sister and friends. 11/13/2020 on evaluation today patient's wound actually appears to be doing quite well. There does not appear to be any signs of infection it has been 2 weeks since have seen her she does have a little bit of callus buildup here today but at the same time I do not believe the vacation time set her back any just has not really made a lot of significant improvement. She is done with her antibiotics at this point. 11/20/2020 upon evaluation today patient appears to be doing poorly in regard to her foot. In fact this appears to be showing signs of Infection. She has erythema and warmth that is concerning. I know she is very discouraged as this seems to be a recurrent issue. I think we may need to delve further into the possibility of something deeper going on here as far as a structural infection. 11/27/2020 patient presents for 1 week follow-up. She had a culture of her left foot ulcer that grew staph aureus sensitive to Augmentin. This was called in by [place], [place] this morning. She is currently on doxycycline for previous culture result. Patient also had an x-ray done of her left foot that had conflicting results. We asked for clarification and was told we would have clarification on Monday. Patient states overall she is doing well. She tries to stay off of her foot is much as possible. 12/04/2020 patient presents for 1 week  follow-up. She is currently taking Augmentin and tolerating this well. It was confirmed that her x-ray had no acute osseous abnormalities. We switched her to collagen from silver alginate last week. Patient is overall doing well and trying to stay off her foot is much as possible. She has no complaints today. 12/11/2020 upon evaluation today patient appears to be doing well with regard to her wound. She has been tolerating the dressing changes without complication. Fortunately there is no signs of active infection noted at this point. I think she is definitely ready to go back into the cast. 12/18/2020 upon evaluation today patient appears to be doing well with regard to her wound. She has been tolerating the dressing changes without complication. Fortunately there is no signs of active infection at this time. No fevers, chills, nausea, vomiting, or diarrhea. 12/25/2020 upon evaluation today patient appears to be doing well in general in regard to her plantar foot ulcer. With that being said she tells me currently that there does not appear to be any signs of pain or problems which is great in fact the wound appears to be almost completely closed which is also excellent news. I do not see any signs of infection which is great news as well. No fevers, chills, nausea, vomiting, or diarrhea. 5/25; patient presents for 1 week follow-up. She has had the  cast in place for the past week and has tolerated this well. She states she is getting orthotics on 5/10. She currently denies signs of infection and has no issues today. 01/08/2021 upon evaluation today patient appears unfortunately to be present today with issues that she has been having with her plantar foot wound. This apparently has reoccurred unfortunately and is causing quite a bit of discomfort and problems for her. There does not appear to be any signs of active infection which is great news and overall I am very pleased with where things stand in that  regard but nonetheless I think she is definitely worried about where this can lead due to past history. She was just taken out of the cast and marked is healed last week. 01/15/2021 upon evaluation today patient unfortunately appears to be doing well but nonetheless continues to have issues with pressure and friction to her foot whenever she is not wearing the cast. Again today I think the wound is significantly improved and almost completely healed in 1 week's time after debridement last week. Nonetheless this has continued to be an issue for that reason we did do a MRI with contrast which really revealed no evidence whatsoever of an issue here. Again this is great but nonetheless still offers up the question what regularly to keep this from continuing to just reopen every time that we see. Again I really do not know exactly what to do is I feel like this falls on the ground outside of wound care more into a issue more specifically with the orthopedic side of things. I understand the concern that a brace would probably be problematic for the patient. That makes complete sense to be honest. It would probably cause rubbing on other areas which would not be good. With that being said also I am not sure what else we can do other than just keeping her in the cast which is really not a feasible option either. Every time I take her out of the cast this reopens almost immediately. 01/22/2021 upon evaluation today patient appears to be doing well with regard to her foot ulcer. This is going to require little bit of sharp debridement today. Fortunately there does not appear to be any signs of active infection at this time. No fevers, chills, nausea, vomiting, or diarrhea. I did previously last week tell the patient that I would contact Dr. Nona Dell office I did do so I spoke with [name] [name] who is the physician assistant for Dr. Doran [name]. Subsequently he did relate to me that they really did not feel like there was  anything that could be done differently for the patient. That is from an orthopedic standpoint. He said that the patient could make an appoint with Dr. Doran [name] to discuss further but he was not really sure that would make a big difference in the treatment plan to be honest. Subsequently he further did recommend that potentially the patient after I offered a thought on the potential for a Crow boot could benefit from this since she seems to do well with a total contact cast. With that being said he did advise that [place] would probably be the way to go for that if need be. 01/29/2021 upon evaluation today patient appears to be doing well with regard to her foot ulcer. In fact this pretty much appears to be healed. With that being said of course I am reluctant to absolutely call it healed and just discharge her due to things we have had  going on in the past. I think we still need to monitor and watch out for this for that reason I am going to ensure nothing is hiding or potentially get a reopen honest by bringing her back next week after using some foam over the area today. 02/05/2021 on evaluation today patient's wound actually appears to be completely closed. I do not see anything open on anything even threatened open. With that being said she did have drainage on her dressing it may appear that she had a small blister that occurred, rupture, and has resolved. Overall I do not see anything else concerning at this point. 02/12/2021 upon evaluation today patient actually still continues to maintain closure as far as her wound is concerned this is actually long as she has been without anything reopening. She has been extremely careful with that however. At this point I think she does need to start slowly and carefully increasing her activity with her shoes wants to get checked tomorrow. She has her appointment with Dr. Prudy Feeler next Wednesday and the following Wednesday with  Dr. Doran [name]. Objective Constitutional Well-nourished and well-hydrated in no acute distress. Vitals Time Taken: 8:30 AM, Temperature: 98.3 F, Pulse: 76 bpm, Respiratory Rate: 18 breaths/min, Blood Pressure: 151/70 mmHg, Pulse Oximetry: 96 %. Respiratory normal breathing without difficulty. Psychiatric this patient is able to make decisions and demonstrates good insight into disease process. Alert and Oriented x 3. pleasant and cooperative. General Notes: Upon inspection patient's wound again showed signs of complete epithelization there is no opening and no draining this is great news and overall I am extremely pleased with where things stand today. No fevers, chills, nausea, vomiting, or diarrhea. Integumentary (Hair, Skin) Wound #1RRRR status is Open. Original cause of wound was Gradually Appeared. The date acquired was: 05/11/2019. The wound has been in treatment 56 weeks. The wound is located on the [place]. The wound measures 0cm length x 0cm width x 0cm depth; 0cm^2 area and 0cm^3 volume. There is no tunneling or undermining noted. There is a none present amount of drainage noted. The wound margin is distinct with the outline attached to the wound base. There is no granulation within the wound bed. There is no necrotic tissue within the wound bed. Assessment Active Problems ICD-10 Type 2 diabetes mellitus with foot ulcer Non-pressure chronic ulcer of other part of left foot with fat layer exposed Charcot's joint, left ankle and foot Type 2 diabetes mellitus with diabetic neuropathy, unspecified Plan Follow-up Appointments: Return appointment in 3 weeks. - with Glynn Octave Shower/ Hygiene: May shower and wash wound with soap and water. Off-Loading: Open toe surgical shoe to: - left foot at all times Additional Orders / Instructions: Follow Nutritious Diet WOUND #1RRRR: - Foot Wound Laterality: Plantar, Left, Medial Secondary Dressing: Woven Gauze Sponges 2x2  in 3 x Per Week/7 Days Discharge Instructions: Apply over primary dressing as directed. Secondary Dressing: Optifoam Non-Adhesive Dressing, 4x4 in 3 x Per Week/7 Days Discharge Instructions: Apply over primary dressing cut to form foam donut Secured With: 72M Medipore H Soft Cloth Surgical T 4 x 2 (in/yd) 3 x Per Week/7 Days ape Discharge Instructions: Secure dressing with tape as directed. 1. Would recommend currently that we going continue with wound care measures as before and using the foam cut out on a doughnut and double layer in order to protect the region. This does seem to have kept things under good control with her taking it easy. 2. I do believe the patient needs to  increase her activity. I would recommend that once she gets her shoes checked tomorrow that she slowly start wearing them according to the stated plan on the sheet that they give her to increase activity and hopefully help with giving her more mobility and activity throughout the day without reopening the wound. Otherwise if it does tend to want to reopen again and we will see with Dr. Prudy Feeler has to say as well with Dr. Doran [name] in the coming 2 weeks. 3. I am also can recommend that we have the patient continue to monitor her foot closely for any signs of drainage or worsening if she has any issues she should let me know. We will see patient back for reevaluation in 3 weeks here in the clinic. If anything worsens or changes patient will contact our office for additional recommendations. I am not discharging her at this point as we have had such a hard time with keeping this wound closed I do not think it is worth discharge at this point. Obviously I would like to keep her monitored until we have a definitive plan and make sure that she is really in good shape going forward. Electronic Signature(s) Signed: 02/12/2021 9:15:59 AM By: [name] Keeler PA-C Entered By: [name] [name] on 02/12/2021  09:15:59 -------------------------------------------------------------------------------- SuperBill Details Patient Name: Date of Service: Hinkle, [name] 02/12/2021 Medical Record Number: [id number] Patient Account Number: [id number] Date of Birth/Sex: Treating RN: 01-May-1946 (75 y.o. [name] [name] Primary Care Provider: Billey [name] Other Clinician: Referring Provider: Treating Provider/Extender: Landis Martins Weeks in Treatment: 5 Diagnosis Coding ICD-10 Codes Code Description E11.621 Type 2 diabetes mellitus with foot ulcer L97.522 Non-pressure chronic ulcer of other part of left foot with fat layer exposed M14.672 Charcot's joint, left ankle and foot E11.40 Type 2 diabetes mellitus with diabetic neuropathy, unspecified Facility Procedures CPT4 Code: [id number] Description: 99213 - WOUND CARE VISIT-LEV 3 EST PT Modifier: Quantity: 1 Physician Procedures : CPT4 Code Description Modifier [id number] - WC PHYS LEVEL 4 - EST PT ICD-10 Diagnosis Description E11.621 Type 2 diabetes mellitus with foot ulcer L97.522 Non-pressure chronic ulcer of other part of left foot with fat layer exposed M14.672 Charcot's  joint, left ankle and foot E11.40 Type 2 diabetes mellitus with diabetic neuropathy, unspecified Quantity: 1 Electronic Signature(s) Signed: 02/12/2021 9:24:58 AM By: [name] Keeler PA-C Entered By: [name] [name] on 02/12/2021 09:24:57

## 2021-02-12 NOTE — Progress Notes (Addendum)
[name] [name] ([id number]) Visit Report for 02/12/2021 Arrival Information Details Patient Name: Date of Service: [name], [name] 02/12/2021 8:00 A M Medical Record Number: [id number] Patient Account Number: [id number] Date of Birth/Sex: Treating RN: [birth year] (75 y.o. [name] [name] Primary Care Prapti Grussing: [name] Other Clinician: Referring Sueann Brownley: Treating Jonny Longino/Extender: Adele Schilder in Treatment: 5 Visit Information History Since Last Visit All ordered tests and consults were completed: No Patient Arrived: Wheel Chair Added or deleted any medications: No Arrival Time: 08:25 Any new allergies or adverse reactions: No Accompanied By: alone Had a fall or experienced change in No Transfer Assistance: None activities of daily living that may affect Patient Identification Verified: Yes risk of falls: Secondary Verification Process Completed: Yes Signs or symptoms of abuse/neglect since last visito No Patient Requires Transmission-Based Precautions: No Hospitalized since last visit: No Patient Has Alerts: No Implantable device outside of the clinic excluding No cellular tissue based products placed in the center since last visit: Has Dressing in Place as Prescribed: Yes Pain Present Now: No Electronic Signature(s) Signed: 02/12/2021 5:07:06 PM By: Leane Call Entered By: Leane Call on 02/12/2021 08:34:42 -------------------------------------------------------------------------------- Clinic Level of Care Assessment Details Patient Name: Date of Service: [name], [name] 02/12/2021 8:00 A M Medical Record Number: [id number] Patient Account Number: [id number] Date of Birth/Sex: Treating RN: 1946/05/21 (75 y.o. [name] [name] Primary Care Marche Hottenstein: [name] Other Clinician: Referring Amorah Sebring: Treating Jeptha Hinnenkamp/Extender: Adele Schilder in Treatment: 5 Clinic Level of Care Assessment Items TOOL 4  Quantity Score []  - 0 Use when only an EandM is performed on FOLLOW-UP visit ASSESSMENTS - Nursing Assessment / Reassessment X- 1 10 Reassessment of Co-morbidities (includes updates in patient status) X- 1 5 Reassessment of Adherence to Treatment Plan ASSESSMENTS - Wound and Skin A ssessment / Reassessment X - Simple Wound Assessment / Reassessment - one wound 1 5 []  - 0 Complex Wound Assessment / Reassessment - multiple wounds []  - 0 Dermatologic / Skin Assessment (not related to wound area) ASSESSMENTS - Focused Assessment []  - 0 Circumferential Edema Measurements - multi extremities []  - 0 Nutritional Assessment / Counseling / Intervention X- 1 5 Lower Extremity Assessment (monofilament, tuning fork, pulses) []  - 0 Peripheral Arterial Disease Assessment (using hand held doppler) ASSESSMENTS - Ostomy and/or Continence Assessment and Care []  - 0 Incontinence Assessment and Management []  - 0 Ostomy Care Assessment and Management (repouching, etc.) PROCESS - Coordination of Care X - Simple Patient / Family Education for ongoing care 1 15 []  - 0 Complex (extensive) Patient / Family Education for ongoing care X- 1 10 Staff obtains Programmer, systems, Records, T Results / Process Orders est []  - 0 Staff telephones HHA, Nursing Homes / Clarify orders / etc []  - 0 Routine Transfer to another Facility (non-emergent condition) []  - 0 Routine Hospital Admission (non-emergent condition) []  - 0 New Admissions / Biomedical engineer / Ordering NPWT Apligraf, etc. , []  - 0 Emergency Hospital Admission (emergent condition) X- 1 10 Simple Discharge Coordination []  - 0 Complex (extensive) Discharge Coordination PROCESS - Special Needs []  - 0 Pediatric / Minor Patient Management []  - 0 Isolation Patient Management []  - 0 Hearing / Language / Visual special needs []  - 0 Assessment of Community assistance (transportation, D/C planning, etc.) []  - 0 Additional assistance / Altered  mentation []  - 0 Support Surface(s) Assessment (bed, cushion, seat, etc.) INTERVENTIONS - Wound Cleansing / Measurement []  - 0 Simple Wound Cleansing -  one wound []  - 0 Complex Wound Cleansing - multiple wounds X- 1 5 Wound Imaging (photographs - any number of wounds) []  - 0 Wound Tracing (instead of photographs) []  - 0 Simple Wound Measurement - one wound []  - 0 Complex Wound Measurement - multiple wounds INTERVENTIONS - Wound Dressings X - Small Wound Dressing one or multiple wounds 1 10 []  - 0 Medium Wound Dressing one or multiple wounds []  - 0 Large Wound Dressing one or multiple wounds []  - 0 Application of Medications - topical []  - 0 Application of Medications - injection INTERVENTIONS - Miscellaneous []  - 0 External ear exam []  - 0 Specimen Collection (cultures, biopsies, blood, body fluids, etc.) []  - 0 Specimen(s) / Culture(s) sent or taken to Lab for analysis []  - 0 Patient Transfer (multiple staff / Civil Service fast streamer / Similar devices) []  - 0 Simple Staple / Suture removal (25 or less) []  - 0 Complex Staple / Suture removal (26 or more) []  - 0 Hypo / Hyperglycemic Management (close monitor of Blood Glucose) []  - 0 Ankle / Brachial Index (ABI) - do not check if billed separately X- 1 5 Vital Signs Has the patient been seen at the hospital within the last three years: Yes Total Score: 80 Level Of Care: New/Established - Level 3 Electronic Signature(s) Signed: 02/12/2021 6:17:15 PM By: [name] RN, BSN Entered By: [name] on 02/12/2021 09:10:48 -------------------------------------------------------------------------------- Encounter Discharge Information Details Patient Name: Date of Service: [name] Guarneri RBA RA E. 02/12/2021 8:00 A M Medical Record Number: [id number] Patient Account Number: [id number] Date of Birth/Sex: Treating RN: 07-May-1946 (75 y.o. [name] [name] Primary Care Allante Whitmire: [name] Other Clinician: Referring  Hoover Grewe: Treating Gabrien Mentink/Extender: Adele Schilder in Treatment: 5 Encounter Discharge Information Items Discharge Condition: Stable Ambulatory Status: Wheelchair Discharge Destination: Home Transportation: Private Auto Accompanied By: self Schedule Follow-up Appointment: Yes Clinical Summary of Care: Patient Declined Electronic Signature(s) Signed: 02/12/2021 6:17:15 PM By: [name] RN, BSN Entered By: [name] on 02/12/2021 09:14:55 -------------------------------------------------------------------------------- Lower Extremity Assessment Details Patient Name: Date of Service: [place], [name] 02/12/2021 8:00 A M Medical Record Number: [id number] Patient Account Number: [id number] Date of Birth/Sex: Treating RN: 1946/06/22 (75 y.o. [name] [name] Primary Care Jeniece Hannis: [name] Other Clinician: Referring Aralyn Nowak: Treating Shanya Ferriss/Extender: Landis Martins Weeks in Treatment: 5 Edema Assessment Assessed: [Left: No] [Right: No] Edema: [Left: N] [Right: o] Calf Left: Right: Point of Measurement: 30 cm From Medial Instep 36 cm Ankle Left: Right: Point of Measurement: 9 cm From Medial Instep 23.5 cm Vascular Assessment Pulses: Dorsalis Pedis Palpable: [Left:Yes] Electronic Signature(s) Signed: 02/12/2021 5:07:06 PM By: Leane Call Entered By: Leane Call on 02/12/2021 08:35:45 -------------------------------------------------------------------------------- Multi-Disciplinary Care Plan Details Patient Name: Date of Service: [name] 02/12/2021 8:00 A M Medical Record Number: [id number] Patient Account Number: [id number] Date of Birth/Sex: Treating RN: [birth year] (75 y.o. [name] [name] Primary Care Tommy Minichiello: [name] Other Clinician: Referring Blake Goya: Treating Ethridge Sollenberger/Extender: Adele Schilder in Treatment: 5 Multidisciplinary Care Plan reviewed with  physician Active Inactive Electronic Signature(s) Signed: 02/28/2021 2:02:21 PM By: [name] RN, BSN Previous Signature: 02/12/2021 6:17:15 PM Version By: [name] RN, BSN Entered By: [name] on 02/26/2021 17:53:53 -------------------------------------------------------------------------------- Pain Assessment Details Patient Name: Date of Service: [name] Guarneri RBA RA E. 02/12/2021 8:00 A M Medical Record Number: [id number] Patient Account Number: [id number] Date of Birth/Sex: Treating RN: November 06, 1945 (75 y.o. F) Leane Call Primary  Care Ercell Perlman: [name] Other Clinician: Referring Anielle Headrick: Treating Kimerly Rowand/Extender: Landis Martins Weeks in Treatment: 5 Active Problems Location of Pain Severity and Description of Pain Patient Has Paino No Site Locations Rate the pain. Rate the pain. Current Pain Level: 0 Pain Management and Medication Current Pain Management: Electronic Signature(s) Signed: 02/12/2021 5:07:06 PM By: Leane Call Entered By: Leane Call on 02/12/2021 08:35:18 -------------------------------------------------------------------------------- Patient/Caregiver Education Details Patient Name: Date of Service: [name] 7/6/2022andnbsp8:00 A M Medical Record Number: [id number] Patient Account Number: [id number] Date of Birth/Gender: Treating RN: Apr 08, 1946 (75 y.o. [name] [name] Primary Care Physician: [name] Other Clinician: Referring Physician: Treating Physician/Extender: Adele Schilder in Treatment: 5 Education Assessment Education Provided To: Patient Education Topics Provided Offloading: Methods: Explain/Verbal Responses: Reinforcements needed, State content correctly Wound/Skin Impairment: Methods: Explain/Verbal Responses: Reinforcements needed, State content correctly Electronic Signature(s) Signed: 02/12/2021 6:17:15 PM By: [name] RN, BSN Entered By:  [name] on 02/12/2021 09:06:19 -------------------------------------------------------------------------------- Wound Assessment Details Patient Name: Date of Service: [place], [name] 02/12/2021 8:00 A M Medical Record Number: [id number] Patient Account Number: [id number] Date of Birth/Sex: Treating RN: 02-Apr-1946 (75 y.o. [name] [name] Primary Care Minna Dumire: [name] Other Clinician: Referring Kimberlie Csaszar: Treating Dequavion Follette/Extender: Landis Martins Weeks in Treatment: 5 Wound Status Wound Number: [id number] Primary Diabetic Wound/Ulcer of the Lower Extremity Etiology: Wound Location: Left, Medial, Plantar Foot Wound Open Wounding Event: Gradually Appeared Status: Date Acquired: 05/11/2019 Comorbid Sleep Apnea, Deep Vein Thrombosis, Hypertension, Colitis, Type Weeks Of Treatment: 56 History: II Diabetes, Gout, Osteoarthritis, Neuropathy Clustered Wound: No Photos Wound Measurements Length: (cm) Width: (cm) Depth: (cm) Area: (cm) Volume: (cm) 0 % Reduction in Area: 100% 0 % Reduction in Volume: 100% 0 Epithelialization: Large (67-100%) 0 Tunneling: No 0 Undermining: No Wound Description Classification: Grade 1 Wound Margin: Distinct, outline attached Exudate Amount: None Present Foul Odor After Cleansing: No Slough/Fibrino No Wound Bed Granulation Amount: None Present (0%) Exposed Structure Necrotic Amount: None Present (0%) Fascia Exposed: No Fat Layer (Subcutaneous Tissue) Exposed: No Tendon Exposed: No Muscle Exposed: No Joint Exposed: No Bone Exposed: No Electronic Signature(s) Signed: 02/12/2021 5:07:06 PM By: Leane Call Signed: 02/12/2021 5:22:20 PM By: Sandre Kitty Entered By: Sandre Kitty on 02/12/2021 16:47:38 -------------------------------------------------------------------------------- Vitals Details Patient Name: Date of Service: [place]eg[place], [name] 02/12/2021 8:00 A M Medical Record Number: [id number] Patient  Account Number: [id number] Date of Birth/Sex: Treating RN: 1946/02/11 (75 y.o. [name] [name] Primary Care Emilya Justen: [name] Other Clinician: Referring Dequann Vandervelden: Treating Kimmie Berggren/Extender: Landis Martins Weeks in Treatment: 5 Vital Signs Time Taken: 08:30 Temperature (F): 98.3 Pulse (bpm): 76 Respiratory Rate (breaths/min): 18 Blood Pressure (mmHg): 151/70 Reference Range: 80 - 120 mg / dl Airway Pulse Oximetry (%): 96 Electronic Signature(s) Signed: 02/12/2021 5:07:06 PM By: Leane Call Entered By: Leane Call on 02/12/2021 08:35:08

## 2021-02-13 ENCOUNTER — Other Ambulatory Visit: Payer: Medicare Other

## 2021-02-13 ENCOUNTER — Encounter: Payer: Self-pay | Admitting: Internal Medicine

## 2021-02-14 ENCOUNTER — Other Ambulatory Visit: Payer: Medicare Other

## 2021-02-14 MED ORDER — LEVEMIR FLEXTOUCH 100 UNIT/ML ~~LOC~~ SOPN: 20.0000 [IU] | PEN_INJECTOR | Freq: Two times a day (BID) | SUBCUTANEOUS | 3 refills | Status: DC

## 2021-02-14 MED ORDER — POTASSIUM CHLORIDE CRYS ER 10 MEQ PO TBCR: 30.0000 meq | EXTENDED_RELEASE_TABLET | Freq: Every day | ORAL | 3 refills | Status: DC

## 2021-02-17 MED ORDER — SERTRALINE HCL 50 MG PO TABS: 50.0000 mg | ORAL_TABLET | Freq: Every day | ORAL | 5 refills | Status: DC

## 2021-02-17 NOTE — Addendum Note (Signed)
Addended by: Binnie Rail on: 02/17/2021 12:46 PM   Modules accepted: Orders

## 2021-02-19 ENCOUNTER — Encounter (HOSPITAL_BASED_OUTPATIENT_CLINIC_OR_DEPARTMENT_OTHER): Payer: Medicare Other | Admitting: Physician Assistant

## 2021-02-19 DIAGNOSIS — E11610 Type 2 diabetes mellitus with diabetic neuropathic arthropathy: Secondary | ICD-10-CM | POA: Diagnosis not present

## 2021-02-19 DIAGNOSIS — M79671 Pain in right foot: Secondary | ICD-10-CM | POA: Diagnosis not present

## 2021-02-19 DIAGNOSIS — M79672 Pain in left foot: Secondary | ICD-10-CM | POA: Diagnosis not present

## 2021-02-26 ENCOUNTER — Encounter: Payer: Self-pay | Admitting: Internal Medicine

## 2021-02-26 DIAGNOSIS — H9193 Unspecified hearing loss, bilateral: Secondary | ICD-10-CM

## 2021-02-28 NOTE — Telephone Encounter (Signed)
This RN called patient, pt reports her Bipap has not been working well. She reports she is "very compliant" and wears it every night, but recently her quality of sleep has declined and she "feels like she needs to take deep breaths" when she wakes up. The patient denies any other symptoms at this time. This RN explained to patient Dr. Claiborne Billings has not been in the office for the past two days, and Mariann Laster has been out of the office as well. However, nurse advised pt her concerns would be sent to Dr. Claiborne Billings for him to address on Monday. Pt verbalized understanding. This RN told patient in the interim if she experiences chest pain, shortness of breath, or any other concerning symptoms she should call 911 or be taken to the emergency department, pt verbalized understanding.

## 2021-02-28 NOTE — Telephone Encounter (Signed)
Referral ordered

## 2021-03-04 DIAGNOSIS — Z86010 Personal history of colonic polyps: Secondary | ICD-10-CM | POA: Diagnosis not present

## 2021-03-04 DIAGNOSIS — R1313 Dysphagia, pharyngeal phase: Secondary | ICD-10-CM | POA: Diagnosis not present

## 2021-03-04 DIAGNOSIS — K5289 Other specified noninfective gastroenteritis and colitis: Secondary | ICD-10-CM | POA: Diagnosis not present

## 2021-03-04 DIAGNOSIS — D649 Anemia, unspecified: Secondary | ICD-10-CM | POA: Diagnosis not present

## 2021-03-04 DIAGNOSIS — L4059 Other psoriatic arthropathy: Secondary | ICD-10-CM | POA: Diagnosis not present

## 2021-03-04 DIAGNOSIS — R198 Other specified symptoms and signs involving the digestive system and abdomen: Secondary | ICD-10-CM | POA: Diagnosis not present

## 2021-03-05 ENCOUNTER — Encounter (HOSPITAL_BASED_OUTPATIENT_CLINIC_OR_DEPARTMENT_OTHER): Payer: Medicare Other | Admitting: Physician Assistant

## 2021-03-05 DIAGNOSIS — B078 Other viral warts: Secondary | ICD-10-CM | POA: Diagnosis not present

## 2021-03-10 ENCOUNTER — Telehealth: Payer: Self-pay | Admitting: *Deleted

## 2021-03-10 NOTE — Telephone Encounter (Signed)
Returned a call to the patient. She states that her BIPAP machine is only showing one number. It is showing 12 but not the 25. She feels like she is not "getting enough air." Patient informed that  I will contact both [employer] and Dr. Claiborne Billings for further recommendations.  Patient agrees with plan.

## 2021-03-12 ENCOUNTER — Other Ambulatory Visit: Payer: Self-pay

## 2021-03-12 ENCOUNTER — Encounter (HOSPITAL_BASED_OUTPATIENT_CLINIC_OR_DEPARTMENT_OTHER): Payer: Medicare Other | Attending: Physician Assistant | Admitting: Physician Assistant

## 2021-03-12 DIAGNOSIS — L97522 Non-pressure chronic ulcer of other part of left foot with fat layer exposed: Secondary | ICD-10-CM | POA: Diagnosis not present

## 2021-03-12 DIAGNOSIS — E1140 Type 2 diabetes mellitus with diabetic neuropathy, unspecified: Secondary | ICD-10-CM | POA: Diagnosis not present

## 2021-03-12 DIAGNOSIS — E11621 Type 2 diabetes mellitus with foot ulcer: Secondary | ICD-10-CM | POA: Diagnosis not present

## 2021-03-12 DIAGNOSIS — E1151 Type 2 diabetes mellitus with diabetic peripheral angiopathy without gangrene: Secondary | ICD-10-CM | POA: Diagnosis not present

## 2021-03-12 NOTE — Progress Notes (Addendum)
[name], [name] ([id number]) Visit Report for 03/12/2021 Chief Complaint Document Details Patient Name: Date of Service: [name], [name] E. 03/12/2021 9:30 A M Medical Record Number: [id number] Patient Account Number: [id number] Date of Birth/Sex: Treating RN: September 08, 1945 (75 y.o. [name] Other Clinician: Referring Provider: Treating Provider/Extender: Adele Schilder in Treatment: 9 Information Obtained from: Patient Chief Complaint Left foot ulcer Electronic Signature(s) Signed: 03/12/2021 9:48:24 AM By: Worthy Keeler PA-C Entered By: Worthy Keeler on 03/12/2021 [id number] -------------------------------------------------------------------------------- Debridement Details Patient Name: Date of Service: [name] [name] 03/12/2021 9:30 A M Medical Record Number: [id number] Patient Account Number: [id number] Date of Birth/Sex: Treating RN: 26-Oct-1945 (75 y.o. [name] Other Clinician: Referring Provider: Treating Provider/Extender: Adele Schilder in Treatment: 9 Debridement Performed for Assessment: Wound #[id number] Left,Medial,Plantar Foot Performed By: Physician Worthy Keeler, PA Debridement Type: Debridement Severity of Tissue Pre Debridement: Fat layer exposed Level of Consciousness (Pre-procedure): Awake and Alert Pre-procedure Verification/Time Out Yes - 10:45 Taken: Start Time: 10:46 T Area Debrided (L x W): otal 1 (cm) x 1 (cm) = 1 (cm) Tissue and other material debrided: Viable, Non-Viable, Callus, Skin: Epidermis Level: Skin/Epidermis Debridement Description: Selective/Open Wound Instrument: Curette Bleeding: Minimum Hemostasis Achieved: Pressure End Time: 10:47 Procedural Pain: 0 Post Procedural Pain: 0 Response to Treatment: Procedure was tolerated well Level of Consciousness (Post- Awake and Alert procedure): Post Debridement  Measurements of Total Wound Length: (cm) 0.4 Width: (cm) 0.4 Depth: (cm) 0.2 Volume: (cm) 0.025 Character of Wound/Ulcer Post Debridement: Improved Severity of Tissue Post Debridement: Fat layer exposed Post Procedure Diagnosis Same as Pre-procedure Electronic Signature(s) Signed: 03/12/2021 5:22:07 PM By: Worthy Keeler PA-C Signed: 03/12/2021 5:57:23 PM By: [name] RN, BSN Entered By: [name] on 03/12/2021 10:48:48 -------------------------------------------------------------------------------- HPI Details Patient Name: Date of Service: [name], [name] RA E. 03/12/2021 9:30 A M Medical Record Number: [id number] Patient Account Number: [id number] Date of Birth/Sex: Treating RN: 1946-06-12 (74 y.o. [name] Other Clinician: Referring Provider: Treating Provider/Extender: Adele Schilder in Treatment: 9 History of Present Illness HPI Description: 01/17/2020 upon evaluation today patient presents for initial evaluation here in our clinic concerning issues she has been having with a left medial/plantar foot ulcer. This is actually been an issue for her since October 2020. She has been seeing Dr. Doran Durand for quite some time during that course. Fortunately there is no signs of active infection at this time. Or least no mention of this to have seen in general. With that being said unfortunately I do see some signs of erythema noted today that does have me concerned about the possibility of infection at this point in the surrounding area of the wound. There is also a warm to touch at the site which is somewhat concerning. Fortunately there is no evidence of systemic infection which is great news. The patient does have a history of diabetes mellitus type 2, Charcot foot which is what led to the wound, and hypertension. She notes that she was in a cast for some time with Dr. Doran Durand for about 8 weeks. During that time they  were utilizing according to the patient silver nitrate along with a foam doughnut and then Coban to secure in place in the cast in place. With that being said I do not have the actual records to review we are going  to try to get a hold of those unfortunately they would not flow over into care everywhere I did look today. She has been seeing Dr. Doran Durand and his physician assistant Larkin Ina up until the end of May and apparently is still seeing them on a regular basis every 2 weeks roughly. She has also tried Iodosorb without effect here. 01/24/2020 upon evaluation today patient actually appears to be doing quite well with regard to her wounds. She has been tolerating the dressing changes without complication. Fortunately there is no signs of active infection spreading which is good news. Her culture did show signs of Staph aureus I did place her on Augmentin due to the erythema surrounding the wound. With that being said the wound does appear to be doing better she has her longer walking cast/boot and I think that is actually good for her for the time being. I am considering reinitiating total contact cast when she gets back from vacation but next week she will actually be out of town at [employer] she knows not to get in the water but she still obviously is planning to enjoy herself she is going to take it easy on her foot however. 02/07/2020 upon evaluation today patient appears to be doing fairly well in regard to her ulcer on her foot. Fortunately there is no signs of severe infection at this time which is great news and overall very pleased in that regard. With that being said I do think that she could still benefit from a total contact cast. Nonetheless she is using her walking boot which at least provide some protection and that it prevents some of the friction occurring when she is ambulating. 02/14/2020 upon evaluation today patient appears to be doing well with regard to her foot ulcer. This is  actually measuring a little bit smaller yet again this week. Overall very pleased with where things stand and I do not see any signs of active infection at this time which is also good news. Since she is measuring better the patient has wanting to somewhat hold off on proceeding with the total contact cast which I think is reasonable at this point. 02/28/2020 on evaluation today patient appears to be doing well in general in regard to her wound although she has a lot of callus buildup as compared to last time I saw her. This is can require sharp debridement today. I do believe she really needs the total contact cast as well which we have discussed previous. 7/23; patient comes in for a total contact cast change 03/06/2020 on evaluation today patient appears to be doing quite well with regard to her wounds. Fortunately the wound bed is measuring smaller and looking much better there is little callus noted although there is some debridement necessary today. 03/13/2020 on evaluation today patient's wound actually appears to be doing excellent which is great news. With that being said unfortunately she is having some issues currently with her left leg where she does have cellulitis it appears. This may have come from an area that rubbed underneath the cast from last week that we noted we padded that area and it looks to be doing excellent at this point but nonetheless the leg was somewhat painful, swollen, and somewhat erythematous. She also had an elevated white blood cell count of 11.5 based on what I saw on looking at her records from the med center in [facility] from where she was seen yesterday. Unfortunately with [place] having a provider on vacation  there was no one here in the clinic in the afternoon when she called therefore she went to the ER as advised. Subsequently they did not cut off the cast as they did not have anyone from orthopedics there to do so and subsequently also did not have the ability to  do the Doppler for evaluation of DVT They recommended therefore given her dose of Eliquis as well as . Augmentin and sent her home to come see [place] today to have the cast taken off and then she is supposed to go back to have the study for DVT performed they are following. 03/20/2020 upon evaluation today patient appears to be doing well with regard to her foot all things considered we have not been able to use the total contact cast due to the infection that she had last week. She has been on the doxycycline and she had a 10-day supply of that I do believe that is helping and her leg appears to be doing better. With that being said there is fortunately no signs of active infection systemically at this time which is good news. No fevers, chills, nausea, vomiting, or diarrhea. 03/27/2020 upon evaluation today patient appears to be doing well with regard to her foot ulcer. There does not appear to be signs of active infection which is great news. Overall I am very pleased with where things stand at this point. 04/03/2020 upon evaluation today patient appears to be doing pretty well in regard to the overall appearance of her wound. Fortunately there is no signs of active infection at this time which is great news. No fevers, chills, nausea, vomiting, or diarrhea. With that being said she does have some blue-green drainage that actually is a little bit concerning to me for the possibility of Pseudomonas. I discussed that with the patient today. With that being said I do believe that we may be able to manage this however with the topical antibiotic cream as opposed to having to do anything oral especially since she seems to be doing so well with overall appearance of the wound. 04/10/2020 on evaluation today patient appears to be doing about the same roughly in regard to the overall size of her wound. With that being said she fortunately has not shown any signs of worsening overall which is good news. I do  believe that she is doing a great job trying to offload but again she may still do better with the cast. I do not see in the blue-green drainage that we noticed previously I do believe the gentamicin help in this regard. 04/17/2020 on evaluation today patient's wound appears to be doing about the same at this point. There is no significant improvement at this point. No fever chills noted. She is up for put the cast back on the day. That she states in a couple weeks she will need to have this off to go to a workshop. 04/24/2020 on evaluation today patient appears to be doing significantly better in regard to her wound. Fortunately there is no signs of active infection and overall feel like she is making great progress the cast seems to have done excellent for her. 05/01/2020 upon evaluation today patient presents for reevaluation she really does not appear to be doing too badly in regard to the actual wound on the left foot we have been managing. Unfortunately she has bilateral lower extremity edema with blisters between the webspace of her first and second toe on both feet. She has a tremendous amount  of edema in the legs which I think is where this is coming from it does not appear to be infected but nonetheless I do believe this is can be something that needs to be addressed today. Obviously this means we probably will not be putting the cast on at this point. She attributes this to the fact that she was sitting with her feet on the floor much longer during a conference last week she had a great time but unfortunately had a lot of complications as a result. 05/08/2020 upon evaluation today patient appears to be doing somewhat better in regard to her wounds at this time. Fortunately there is no signs of active infection which is great news. With that being said I do believe that the blisters have ruptured and unfortunately did not just reattach I will remove some of the blistered tissue today. With that  being said I do think the wound itself on the plantar aspect of left foot does need to have sharp debridement. 05/15/2020 upon evaluation today patient appears to be doing about the same in regard to her foot ulcer. Unfortunately in the past week her husband had a fall where he sustained a mild traumatic brain bleed. Fortunately he is doing better but being that he was in the hospital she had a walk on this a lot more. The wound does not appear to be any better is also not really appearing to be significantly worse which is good news. There is no signs of active infection at this time. 10/14; patient with a small diabetic wound on the medial part of her left foot. We have been using silver collagen a total contact cast making good progress. I think the patient had a series of blisters on her dorsal foot probably secondary to having her legs recumbent for 3 days while in a conference in [place]. We wrapped her leg last week these are all healed. We did not previously have her in compression on the right leg. 05/29/2020 upon evaluation today patient appears to be doing well with regard to the wound on the plantar aspect of her foot medially. This is measuring smaller and looking much better than last time I saw her. Again when I did see her last was 2 weeks back and the wound was significantly larger. I do believe the cast is helping and I believe the collagen is a good option for her. 06/05/2020 on evaluation today patient appears to be doing well with regard to her foot ulcer this is actually measuring significantly better and overall I feel like she is doing excellent. There is no signs of active infection at this time. 06/12/2020 upon evaluation today patient actually continues to show signs of good improvement which is excellent news. I am extremely pleased with how she seems to be progressing at this point in regard to her wound. There is still some depth to the wound but I do believe the collagen is  helping her quite a bit. 06/19/2020 upon evaluation today patient appears to be doing well with regard to her plantar foot ulcer. She is actually making excellent progress and in fact this appears to be almost completely healed. With that being said I do believe that the patient is going to actually require 1 more week in the cast although after that I am hopeful she will be ready for discharge. 06/26/2020 on evaluation today patient appears to be doing well in regard to her wound currently. Fortunately there is no signs of active  infection in general I feel like she is doing excellent. This appears to be completely healed I think she is ready to come out of the cast. 11/29; patient comes back in the clinic today with a very quick reopening in the exact same area on the medial plantar foot. She had been healed out last time. She went back into some new balance shoes that she got at [employer] with a custom insert. As it turns out this wound also happened when wearing these shoes although there was some modification made I think with the wound initially happened they added some foam around the wound area. This obviously is not going to be sufficient. 07/17/2020 on evaluation today patient appears to be doing well with regard to her wound. Fortunately she seems to be making good progress. Unfortunately she was in the hospital due to a issue with colitis and had just been discharged today in fact. She tells me that her biggest concern here is that a lot of her numbers especially her creatinine were somewhat elevated and problematic. She is can be following up with her provider in order to have a further work-up at this time. With that being said she did need to come back in for a cast change. She did not allow them to remove the cast due to the fact that she did not feel like that was the issue whatsoever and indeed it does not appear that was the case her wound appears to be doing excellent  today. 07/24/2020 upon evaluation today patient appears to be doing well in regard to her foot ulcer. She still has a small opening but this is showing signs of excellent improvement overall but they were very close to complete resolution. No fevers, chills, nausea, vomiting, or diarrhea. 07/31/2020 upon evaluation today patient actually appears to be doing excellent she is actually completely healed this is great news. Fortunately there is no signs of active infection at this time. No fevers, chills, nausea, vomiting, or diarrhea. The patient tells me that she did see Dr. Paulla Dolly in order to get to [place] so that she can have a custom shoe made. She saw him earlier last week. She does have an appointment with Liliane Channel on the sixth I believe on January she tells me Readmission: 08/28/2020 on evaluation today patient appears to be doing poorly in regard to her wound currently. She tells me this has reopened. The good news that she did get measured for and actually her shoes are on the way in from [facility].. With that being said she has been trying to stay off of this is much as possible using her wheelchair around home. Nonetheless has been somewhat difficult. The good news is her shoes should be here next week 09/04/2020 upon evaluation today patient appears to be doing well with regard to her wound. There is a little bit of callus buildup but nothing too significant she does tell me she was very active this week. Fortunately there is no signs of systemic infection at this point. The dorsal foot wound actually appears to be doing better. I think this is healed. 09/11/2020 upon evaluation today patient appears to actually be doing quite well in my opinion based on what I am seeing today. Fortunately there is no signs of infection in fact her mother is certain that this is even open any longer based on what I see. Fortunately I think the patient has been doing everything she can to try to keep this under  control. 09/18/2020 upon evaluation today patient appears to be doing about the same in regard to her foot ulcer. She has been tolerating the dressing changes without complication. Fortunately there is no sign of active infection at this time. No fevers, chills, nausea, vomiting, or diarrhea. She is going require some sharp debridement today. 09/25/2020 upon evaluation today patient appears to be doing well with regard to his wounds she has been tolerating the dressing changes without complication. Her wound appears to be completely healed based on what I am seeing at this point. There does not appear to be any signs of active infection at this time which is great news. No fevers, chills, nausea, vomiting, or diarrhea. She did get the cushion for her foot as well that she ordered to try to help keep pressure off of this area. That looks like it may be very beneficial for her to be honest. 10/02/2020 upon evaluation today patient appears to be doing more poorly in regard to her foot ulcer. Unfortunately this has reopened since we saw her last week. It apparently did not take too long at all for this to happen. She is obviously somewhat disappointed as she was hopeful that that would be time this thing would stay closed. Fortunately there is no evidence of active infection at this point. No fevers, chills, nausea, vomiting, or diarrhea. With that being said I am good have to perform a little bit of sharp debridement clear away some of the debris currently. Including a minimal amount of callus at this time. 10/16/2020 upon evaluation today patient appears to be doing about the same in regard to her foot ulcer unfortunately. There does not appear to be any signs of active infection which is great news. No fever chills noted she has been tolerating the dressing changes without complication which is great news. Unfortunately she tells me that she has been very depressed about the situation is getting very  frustrating to her that she continues to have issues despite everything that she is trying to do to stay off of her foot. She is extremely discouraged and I do hate to hear this. Obviously we have been hopeful that if she got her shoes that would make a difference she is actually can be getting those tomorrow but I am not certain that that alone is good to be enough to get this to heal. We may end up having to consider going back into a total contact cast to get this to heal and then subsequently once we get her healed get her into her new diabetic shoes which will and my hope anyway keep this from reopening again. 10/23/2020 upon evaluation today patient appears to be doing a little worse both in regard to the size of her wound as well as in regard to the fact that she has erythema surrounding the wound and wrap around the lateral part of her foot where she tells me has been somewhat sore. Fortunately there does not appear to be any evidence of infection systemically but locally definitely there is erythema and warmth consistent with local cellulitis. I am glad to remove some of the callus as well. 10/30/2020 upon evaluation today patient appears to be doing well with regard to her wound on the plantar aspect of her foot. This is significantly improved compared to last week. 10/30/2020 upon evaluation today patient actually is making excellent progress her wound appears to show signs of great improvement which is wonderful and that she is extremely pleased to  hear this. She actually leaves Sunday to go on her trip with her half sister and friends. 11/13/2020 on evaluation today patient's wound actually appears to be doing quite well. There does not appear to be any signs of infection it has been 2 weeks since have seen her she does have a little bit of callus buildup here today but at the same time I do not believe the vacation time set her back any just has not really made a lot of significant  improvement. She is done with her antibiotics at this point. 11/20/2020 upon evaluation today patient appears to be doing poorly in regard to her foot. In fact this appears to be showing signs of Infection. She has erythema and warmth that is concerning. I know she is very discouraged as this seems to be a recurrent issue. I think we may need to delve further into the possibility of something deeper going on here as far as a structural infection. 11/27/2020 patient presents for 1 week follow-up. She had a culture of her left foot ulcer that grew staph aureus sensitive to Augmentin. This was called in by [place], [place] this morning. She is currently on doxycycline for previous culture result. Patient also had an x-ray done of her left foot that had conflicting results. We asked for clarification and was told we would have clarification on Monday. Patient states overall she is doing well. She tries to stay off of her foot is much as possible. 12/04/2020 patient presents for 1 week follow-up. She is currently taking Augmentin and tolerating this well. It was confirmed that her x-ray had no acute osseous abnormalities. We switched her to collagen from silver alginate last week. Patient is overall doing well and trying to stay off her foot is much as possible. She has no complaints today. 12/11/2020 upon evaluation today patient appears to be doing well with regard to her wound. She has been tolerating the dressing changes without complication. Fortunately there is no signs of active infection noted at this point. I think she is definitely ready to go back into the cast. 12/18/2020 upon evaluation today patient appears to be doing well with regard to her wound. She has been tolerating the dressing changes without complication. Fortunately there is no signs of active infection at this time. No fevers, chills, nausea, vomiting, or diarrhea. 12/25/2020 upon evaluation today patient appears to be doing well in general in  regard to her plantar foot ulcer. With that being said she tells me currently that there does not appear to be any signs of pain or problems which is great in fact the wound appears to be almost completely closed which is also excellent news. I do not see any signs of infection which is great news as well. No fevers, chills, nausea, vomiting, or diarrhea. 5/25; patient presents for 1 week follow-up. She has had the cast in place for the past week and has tolerated this well. She states she is getting orthotics on 5/10. She currently denies signs of infection and has no issues today. 01/08/2021 upon evaluation today patient appears unfortunately to be present today with issues that she has been having with her plantar foot wound. This apparently has reoccurred unfortunately and is causing quite a bit of discomfort and problems for her. There does not appear to be any signs of active infection which is great news and overall I am very pleased with where things stand in that regard but nonetheless I think she is definitely worried about  where this can lead due to past history. She was just taken out of the cast and marked is healed last week. 01/15/2021 upon evaluation today patient unfortunately appears to be doing well but nonetheless continues to have issues with pressure and friction to her foot whenever she is not wearing the cast. Again today I think the wound is significantly improved and almost completely healed in 1 week's time after debridement last week. Nonetheless this has continued to be an issue for that reason we did do a MRI with contrast which really revealed no evidence whatsoever of an issue here. Again this is great but nonetheless still offers up the question what regularly to keep this from continuing to just reopen every time that we see. Again I really do not know exactly what to do is I feel like this falls on the ground outside of wound care more into a issue more specifically with  the orthopedic side of things. I understand the concern that a brace would probably be problematic for the patient. That makes complete sense to be honest. It would probably cause rubbing on other areas which would not be good. With that being said also I am not sure what else we can do other than just keeping her in the cast which is really not a feasible option either. Every time I take her out of the cast this reopens almost immediately. 01/22/2021 upon evaluation today patient appears to be doing well with regard to her foot ulcer. This is going to require little bit of sharp debridement today. Fortunately there does not appear to be any signs of active infection at this time. No fevers, chills, nausea, vomiting, or diarrhea. I did previously last week tell the patient that I would contact Dr. Nona Dell office I did do so I spoke with Larkin Ina who is the physician assistant for Dr. Doran Durand. Subsequently he did relate to me that they really did not feel like there was anything that could be done differently for the patient. That is from an orthopedic standpoint. He said that the patient could make an appoint with Dr. Doran Durand to discuss further but he was not really sure that would make a big difference in the treatment plan to be honest. Subsequently he further did recommend that potentially the patient after I offered a thought on the potential for a Crow boot could benefit from this since she seems to do well with a total contact cast. With that being said he did advise that [place] [facility] would probably be the way to go for that if need be. 01/29/2021 upon evaluation today patient appears to be doing well with regard to her foot ulcer. In fact this pretty much appears to be healed. With that being said of course I am reluctant to absolutely call it healed and just discharge her due to things we have had going on in the past. I think we still need to monitor and watch out for this for that reason I am going to  ensure nothing is hiding or potentially get a reopen honest by bringing her back next week after using some foam over the area today. 02/05/2021 on evaluation today patient's wound actually appears to be completely closed. I do not see anything open on anything even threatened open. With that being said she did have drainage on her dressing it may appear that she had a small blister that occurred, rupture, and has resolved. Overall I do not see anything else concerning at this point.  02/12/2021 upon evaluation today patient actually still continues to maintain closure as far as her wound is concerned this is actually long as she has been without anything reopening. She has been extremely careful with that however. At this point I think she does need to start slowly and carefully increasing her activity with her shoes wants to get checked tomorrow. She has her appointment with Dr. Prudy Feeler next Wednesday and the following Wednesday with Dr. Doran Durand. 03/12/2021 upon evaluation today patient appears to be doing a little worse obviously and the fact that she has been healed for a bit of time and then subsequently reopened in the past week. We got her scheduled for a repeat appointment here to try to get things back under control. Fortunately there does not appear to be any evidence of active infection. Unfortunately I do believe we need to clear away some of the callus and necrotic debris. Electronic Signature(s) Signed: 03/12/2021 10:52:57 AM By: Worthy Keeler PA-C Entered By: Worthy Keeler on 03/12/2021 10:52:57 -------------------------------------------------------------------------------- Physical Exam Details Patient Name: Date of Service: [name] [name], [name] RA E. 03/12/2021 9:30 A M Medical Record Number: [id number] Patient Account Number: [id number] Date of Birth/Sex: Treating RN: March 31, 1946 (75 y.o. [name] Other Clinician: Referring Provider: Treating  Provider/Extender: Landis Martins Weeks in Treatment: 9 Constitutional Well-nourished and well-hydrated in no acute distress. Respiratory normal breathing without difficulty. Psychiatric this patient is able to make decisions and demonstrates good insight into disease process. Alert and Oriented x 3. pleasant and cooperative. Notes Upon inspection patient's wound bed actually showed signs of good granulation epithelization at this point. There does not appear to be any evidence of active infection which is great news and overall I am extremely pleased with where things stand today. I did have to perform sharp debridement clear away some of the callus and necrotic debris around the edges of the wound. Patient tolerated that today without complication and postdebridement the wound bed appears to be doing significantly better. Electronic Signature(s) Signed: 03/12/2021 10:53:25 AM By: Worthy Keeler PA-C Entered By: Worthy Keeler on 03/12/2021 10:53:24 -------------------------------------------------------------------------------- Physician Orders Details Patient Name: Date of Service: Macnaughton, [name] RA E. 03/12/2021 9:30 A M Medical Record Number: [id number] Patient Account Number: [id number] Date of Birth/Sex: Treating RN: 1946/02/02 (75 y.o. [name] Other Clinician: Referring Provider: Treating Provider/Extender: Adele Schilder in Treatment: 9 Verbal / Phone Orders: No Diagnosis Coding ICD-10 Coding Code Description E11.621 Type 2 diabetes mellitus with foot ulcer L97.522 Non-pressure chronic ulcer of other part of left foot with fat layer exposed M14.672 Charcot's joint, left ankle and foot E11.40 Type 2 diabetes mellitus with diabetic neuropathy, unspecified Follow-up Appointments Return Appointment in 1 week. Bathing/ Shower/ Hygiene May shower and wash wound with soap and water. Off-Loading Open  toe surgical shoe to: - left foot at all times Additional Orders / Instructions Follow Nutritious Diet Wound Treatment Wound #[id number] - Foot Wound Laterality: Plantar, Left, Medial Prim Dressing: KerraCel Ag Gelling Fiber Dressing, 2x2 in (silver alginate) 3 x Per Week/7 Days ary Discharge Instructions: Apply silver alginate to wound bed as instructed Secondary Dressing: Woven Gauze Sponges 2x2 in 3 x Per Week/7 Days Discharge Instructions: Apply over primary dressing as directed. Secondary Dressing: Optifoam Non-Adhesive Dressing, 4x4 in 3 x Per Week/7 Days Discharge Instructions: Apply over primary dressing cut to form foam donut Secured With: 16M Medipore H  Soft Cloth Surgical T 4 x 2 (in/yd) 3 x Per Week/7 Days ape Discharge Instructions: Secure dressing with tape as directed. Electronic Signature(s) Signed: 03/12/2021 5:22:07 PM By: Worthy Keeler PA-C Signed: 03/12/2021 5:57:23 PM By: [name] RN, BSN Entered By: [name] on 03/12/2021 10:50:28 -------------------------------------------------------------------------------- Problem List Details Patient Name: Date of Service: [name] [name] RBA RA E. 03/12/2021 9:30 A M Medical Record Number: [id number] Patient Account Number: [id number] Date of Birth/Sex: Treating RN: [birth year] (75 y.o. [name] Other Clinician: Referring Provider: Treating Provider/Extender: Adele Schilder in Treatment: 9 Active Problems ICD-10 Encounter Code Description Active Date MDM Diagnosis E11.621 Type 2 diabetes mellitus with foot ulcer 01/08/2021 No Yes L97.522 Non-pressure chronic ulcer of other part of left foot with fat layer exposed 01/08/2021 No Yes M14.672 Charcot's joint, left ankle and foot 01/08/2021 No Yes E11.40 Type 2 diabetes mellitus with diabetic neuropathy, unspecified 01/08/2021 No Yes Inactive Problems Resolved Problems Electronic Signature(s) Signed: 03/12/2021  9:48:09 AM By: Worthy Keeler PA-C Entered By: Worthy Keeler on 03/12/2021 09:48:08 -------------------------------------------------------------------------------- Progress Note Details Patient Name: Date of Service: [name] [name], [name] RA E. 03/12/2021 9:30 A M Medical Record Number: [id number] Patient Account Number: [id number] Date of Birth/Sex: Treating RN: Jan 10, 1946 (75 y.o. [name] Other Clinician: Referring Provider: Treating Provider/Extender: Adele Schilder in Treatment: 9 Subjective Chief Complaint Information obtained from Patient Left foot ulcer History of Present Illness (HPI) 01/17/2020 upon evaluation today patient presents for initial evaluation here in our clinic concerning issues she has been having with a left medial/plantar foot ulcer. This is actually been an issue for her since October 2020. She has been seeing Dr. Doran Durand for quite some time during that course. Fortunately there is no signs of active infection at this time. Or least no mention of this to have seen in general. With that being said unfortunately I do see some signs of erythema noted today that does have me concerned about the possibility of infection at this point in the surrounding area of the wound. There is also a warm to touch at the site which is somewhat concerning. Fortunately there is no evidence of systemic infection which is great news. The patient does have a history of diabetes mellitus type 2, Charcot foot which is what led to the wound, and hypertension. She notes that she was in a cast for some time with Dr. Doran Durand for about 8 weeks. During that time they were utilizing according to the patient silver nitrate along with a foam doughnut and then Coban to secure in place in the cast in place. With that being said I do not have the actual records to review we are going to try to get a hold of those unfortunately they would not flow  over into care everywhere I did look today. She has been seeing Dr. Doran Durand and his physician assistant Larkin Ina up until the end of May and apparently is still seeing them on a regular basis every 2 weeks roughly. She has also tried Iodosorb without effect here. 01/24/2020 upon evaluation today patient actually appears to be doing quite well with regard to her wounds. She has been tolerating the dressing changes without complication. Fortunately there is no signs of active infection spreading which is good news. Her culture did show signs of Staph aureus I did place her on Augmentin due to the erythema surrounding the wound. With  that being said the wound does appear to be doing better she has her longer walking cast/boot and I think that is actually good for her for the time being. I am considering reinitiating total contact cast when she gets back from vacation but next week she will actually be out of town at [employer] she knows not to get in the water but she still obviously is planning to enjoy herself she is going to take it easy on her foot however. 02/07/2020 upon evaluation today patient appears to be doing fairly well in regard to her ulcer on her foot. Fortunately there is no signs of severe infection at this time which is great news and overall very pleased in that regard. With that being said I do think that she could still benefit from a total contact cast. Nonetheless she is using her walking boot which at least provide some protection and that it prevents some of the friction occurring when she is ambulating. 02/14/2020 upon evaluation today patient appears to be doing well with regard to her foot ulcer. This is actually measuring a little bit smaller yet again this week. Overall very pleased with where things stand and I do not see any signs of active infection at this time which is also good news. Since she is measuring better the patient has wanting to somewhat hold off on proceeding with  the total contact cast which I think is reasonable at this point. 02/28/2020 on evaluation today patient appears to be doing well in general in regard to her wound although she has a lot of callus buildup as compared to last time I saw her. This is can require sharp debridement today. I do believe she really needs the total contact cast as well which we have discussed previous. 7/23; patient comes in for a total contact cast change 03/06/2020 on evaluation today patient appears to be doing quite well with regard to her wounds. Fortunately the wound bed is measuring smaller and looking much better there is little callus noted although there is some debridement necessary today. 03/13/2020 on evaluation today patient's wound actually appears to be doing excellent which is great news. With that being said unfortunately she is having some issues currently with her left leg where she does have cellulitis it appears. This may have come from an area that rubbed underneath the cast from last week that we noted we padded that area and it looks to be doing excellent at this point but nonetheless the leg was somewhat painful, swollen, and somewhat erythematous. She also had an elevated white blood cell count of 11.5 based on what I saw on looking at her records from the med center in [facility] from where she was seen yesterday. Unfortunately with [place] having a provider on vacation there was no one here in the clinic in the afternoon when she called therefore she went to the ER as advised. Subsequently they did not cut off the cast as they did not have anyone from orthopedics there to do so and subsequently also did not have the ability to do the Doppler for evaluation of DVT They recommended therefore given her dose of Eliquis as well as . Augmentin and sent her home to come see [place] today to have the cast taken off and then she is supposed to go back to have the study for DVT performed they are following. 03/20/2020  upon evaluation today patient appears to be doing well with regard to her foot all  things considered we have not been able to use the total contact cast due to the infection that she had last week. She has been on the doxycycline and she had a 10-day supply of that I do believe that is helping and her leg appears to be doing better. With that being said there is fortunately no signs of active infection systemically at this time which is good news. No fevers, chills, nausea, vomiting, or diarrhea. 03/27/2020 upon evaluation today patient appears to be doing well with regard to her foot ulcer. There does not appear to be signs of active infection which is great news. Overall I am very pleased with where things stand at this point. 04/03/2020 upon evaluation today patient appears to be doing pretty well in regard to the overall appearance of her wound. Fortunately there is no signs of active infection at this time which is great news. No fevers, chills, nausea, vomiting, or diarrhea. With that being said she does have some blue-green drainage that actually is a little bit concerning to me for the possibility of Pseudomonas. I discussed that with the patient today. With that being said I do believe that we may be able to manage this however with the topical antibiotic cream as opposed to having to do anything oral especially since she seems to be doing so well with overall appearance of the wound. 04/10/2020 on evaluation today patient appears to be doing about the same roughly in regard to the overall size of her wound. With that being said she fortunately has not shown any signs of worsening overall which is good news. I do believe that she is doing a great job trying to offload but again she may still do better with the cast. I do not see in the blue-green drainage that we noticed previously I do believe the gentamicin help in this regard. 04/17/2020 on evaluation today patient's wound appears to be doing  about the same at this point. There is no significant improvement at this point. No fever chills noted. She is up for put the cast back on the day. That she states in a couple weeks she will need to have this off to go to a workshop. 04/24/2020 on evaluation today patient appears to be doing significantly better in regard to her wound. Fortunately there is no signs of active infection and overall feel like she is making great progress the cast seems to have done excellent for her. 05/01/2020 upon evaluation today patient presents for reevaluation she really does not appear to be doing too badly in regard to the actual wound on the left foot we have been managing. Unfortunately she has bilateral lower extremity edema with blisters between the webspace of her first and second toe on both feet. She has a tremendous amount of edema in the legs which I think is where this is coming from it does not appear to be infected but nonetheless I do believe this is can be something that needs to be addressed today. Obviously this means we probably will not be putting the cast on at this point. She attributes this to the fact that she was sitting with her feet on the floor much longer during a conference last week she had a great time but unfortunately had a lot of complications as a result. 05/08/2020 upon evaluation today patient appears to be doing somewhat better in regard to her wounds at this time. Fortunately there is no signs of active infection which is great news.  With that being said I do believe that the blisters have ruptured and unfortunately did not just reattach I will remove some of the blistered tissue today. With that being said I do think the wound itself on the plantar aspect of left foot does need to have sharp debridement. 05/15/2020 upon evaluation today patient appears to be doing about the same in regard to her foot ulcer. Unfortunately in the past week her husband had a fall where he sustained  a mild traumatic brain bleed. Fortunately he is doing better but being that he was in the hospital she had a walk on this a lot more. The wound does not appear to be any better is also not really appearing to be significantly worse which is good news. There is no signs of active infection at this time. 10/14; patient with a small diabetic wound on the medial part of her left foot. We have been using silver collagen a total contact cast making good progress. I think the patient had a series of blisters on her dorsal foot probably secondary to having her legs recumbent for 3 days while in a conference in [place]. We wrapped her leg last week these are all healed. We did not previously have her in compression on the right leg. 05/29/2020 upon evaluation today patient appears to be doing well with regard to the wound on the plantar aspect of her foot medially. This is measuring smaller and looking much better than last time I saw her. Again when I did see her last was 2 weeks back and the wound was significantly larger. I do believe the cast is helping and I believe the collagen is a good option for her. 06/05/2020 on evaluation today patient appears to be doing well with regard to her foot ulcer this is actually measuring significantly better and overall I feel like she is doing excellent. There is no signs of active infection at this time. 06/12/2020 upon evaluation today patient actually continues to show signs of good improvement which is excellent news. I am extremely pleased with how she seems to be progressing at this point in regard to her wound. There is still some depth to the wound but I do believe the collagen is helping her quite a bit. 06/19/2020 upon evaluation today patient appears to be doing well with regard to her plantar foot ulcer. She is actually making excellent progress and in fact this appears to be almost completely healed. With that being said I do believe that the patient is  going to actually require 1 more week in the cast although after that I am hopeful she will be ready for discharge. 06/26/2020 on evaluation today patient appears to be doing well in regard to her wound currently. Fortunately there is no signs of active infection in general I feel like she is doing excellent. This appears to be completely healed I think she is ready to come out of the cast. 11/29; patient comes back in the clinic today with a very quick reopening in the exact same area on the medial plantar foot. She had been healed out last time. She went back into some new balance shoes that she got at [employer] with a custom insert. As it turns out this wound also happened when wearing these shoes although there was some modification made I think with the wound initially happened they added some foam around the wound area. This obviously is not going to be sufficient. 07/17/2020 on evaluation today patient appears  to be doing well with regard to her wound. Fortunately she seems to be making good progress. Unfortunately she was in the hospital due to a issue with colitis and had just been discharged today in fact. She tells me that her biggest concern here is that a lot of her numbers especially her creatinine were somewhat elevated and problematic. She is can be following up with her provider in order to have a further work-up at this time. With that being said she did need to come back in for a cast change. She did not allow them to remove the cast due to the fact that she did not feel like that was the issue whatsoever and indeed it does not appear that was the case her wound appears to be doing excellent today. 07/24/2020 upon evaluation today patient appears to be doing well in regard to her foot ulcer. She still has a small opening but this is showing signs of excellent improvement overall but they were very close to complete resolution. No fevers, chills, nausea, vomiting, or  diarrhea. 07/31/2020 upon evaluation today patient actually appears to be doing excellent she is actually completely healed this is great news. Fortunately there is no signs of active infection at this time. No fevers, chills, nausea, vomiting, or diarrhea. The patient tells me that she did see Dr. Paulla Dolly in order to get to [place] so that she can have a custom shoe made. She saw him earlier last week. She does have an appointment with Liliane Channel on the sixth I believe on January she tells me Readmission: 08/28/2020 on evaluation today patient appears to be doing poorly in regard to her wound currently. She tells me this has reopened. The good news that she did get measured for and actually her shoes are on the way in from [facility].. With that being said she has been trying to stay off of this is much as possible using her wheelchair around home. Nonetheless has been somewhat difficult. The good news is her shoes should be here next week 09/04/2020 upon evaluation today patient appears to be doing well with regard to her wound. There is a little bit of callus buildup but nothing too significant she does tell me she was very active this week. Fortunately there is no signs of systemic infection at this point. The dorsal foot wound actually appears to be doing better. I think this is healed. 09/11/2020 upon evaluation today patient appears to actually be doing quite well in my opinion based on what I am seeing today. Fortunately there is no signs of infection in fact her mother is certain that this is even open any longer based on what I see. Fortunately I think the patient has been doing everything she can to try to keep this under control. 09/18/2020 upon evaluation today patient appears to be doing about the same in regard to her foot ulcer. She has been tolerating the dressing changes without complication. Fortunately there is no sign of active infection at this time. No fevers, chills, nausea, vomiting, or  diarrhea. She is going require some sharp debridement today. 09/25/2020 upon evaluation today patient appears to be doing well with regard to his wounds she has been tolerating the dressing changes without complication. Her wound appears to be completely healed based on what I am seeing at this point. There does not appear to be any signs of active infection at this time which is great news. No fevers, chills, nausea, vomiting, or diarrhea. She  did get the cushion for her foot as well that she ordered to try to help keep pressure off of this area. That looks like it may be very beneficial for her to be honest. 10/02/2020 upon evaluation today patient appears to be doing more poorly in regard to her foot ulcer. Unfortunately this has reopened since we saw her last week. It apparently did not take too long at all for this to happen. She is obviously somewhat disappointed as she was hopeful that that would be time this thing would stay closed. Fortunately there is no evidence of active infection at this point. No fevers, chills, nausea, vomiting, or diarrhea. With that being said I am good have to perform a little bit of sharp debridement clear away some of the debris currently. Including a minimal amount of callus at this time. 10/16/2020 upon evaluation today patient appears to be doing about the same in regard to her foot ulcer unfortunately. There does not appear to be any signs of active infection which is great news. No fever chills noted she has been tolerating the dressing changes without complication which is great news. Unfortunately she tells me that she has been very depressed about the situation is getting very frustrating to her that she continues to have issues despite everything that she is trying to do to stay off of her foot. She is extremely discouraged and I do hate to hear this. Obviously we have been hopeful that if she got her shoes that would make a difference she is actually can be  getting those tomorrow but I am not certain that that alone is good to be enough to get this to heal. We may end up having to consider going back into a total contact cast to get this to heal and then subsequently once we get her healed get her into her new diabetic shoes which will and my hope anyway keep this from reopening again. 10/23/2020 upon evaluation today patient appears to be doing a little worse both in regard to the size of her wound as well as in regard to the fact that she has erythema surrounding the wound and wrap around the lateral part of her foot where she tells me has been somewhat sore. Fortunately there does not appear to be any evidence of infection systemically but locally definitely there is erythema and warmth consistent with local cellulitis. I am glad to remove some of the callus as well. 10/30/2020 upon evaluation today patient appears to be doing well with regard to her wound on the plantar aspect of her foot. This is significantly improved compared to last week. 10/30/2020 upon evaluation today patient actually is making excellent progress her wound appears to show signs of great improvement which is wonderful and that she is extremely pleased to hear this. She actually leaves Sunday to go on her trip with her half sister and friends. 11/13/2020 on evaluation today patient's wound actually appears to be doing quite well. There does not appear to be any signs of infection it has been 2 weeks since have seen her she does have a little bit of callus buildup here today but at the same time I do not believe the vacation time set her back any just has not really made a lot of significant improvement. She is done with her antibiotics at this point. 11/20/2020 upon evaluation today patient appears to be doing poorly in regard to her foot. In fact this appears to be showing signs of Infection.  She has erythema and warmth that is concerning. I know she is very discouraged as this seems  to be a recurrent issue. I think we may need to delve further into the possibility of something deeper going on here as far as a structural infection. 11/27/2020 patient presents for 1 week follow-up. She had a culture of her left foot ulcer that grew staph aureus sensitive to Augmentin. This was called in by [place], [place] this morning. She is currently on doxycycline for previous culture result. Patient also had an x-ray done of her left foot that had conflicting results. We asked for clarification and was told we would have clarification on Monday. Patient states overall she is doing well. She tries to stay off of her foot is much as possible. 12/04/2020 patient presents for 1 week follow-up. She is currently taking Augmentin and tolerating this well. It was confirmed that her x-ray had no acute osseous abnormalities. We switched her to collagen from silver alginate last week. Patient is overall doing well and trying to stay off her foot is much as possible. She has no complaints today. 12/11/2020 upon evaluation today patient appears to be doing well with regard to her wound. She has been tolerating the dressing changes without complication. Fortunately there is no signs of active infection noted at this point. I think she is definitely ready to go back into the cast. 12/18/2020 upon evaluation today patient appears to be doing well with regard to her wound. She has been tolerating the dressing changes without complication. Fortunately there is no signs of active infection at this time. No fevers, chills, nausea, vomiting, or diarrhea. 12/25/2020 upon evaluation today patient appears to be doing well in general in regard to her plantar foot ulcer. With that being said she tells me currently that there does not appear to be any signs of pain or problems which is great in fact the wound appears to be almost completely closed which is also excellent news. I do not see any signs of infection which is great news  as well. No fevers, chills, nausea, vomiting, or diarrhea. 5/25; patient presents for 1 week follow-up. She has had the cast in place for the past week and has tolerated this well. She states she is getting orthotics on 5/10. She currently denies signs of infection and has no issues today. 01/08/2021 upon evaluation today patient appears unfortunately to be present today with issues that she has been having with her plantar foot wound. This apparently has reoccurred unfortunately and is causing quite a bit of discomfort and problems for her. There does not appear to be any signs of active infection which is great news and overall I am very pleased with where things stand in that regard but nonetheless I think she is definitely worried about where this can lead due to past history. She was just taken out of the cast and marked is healed last week. 01/15/2021 upon evaluation today patient unfortunately appears to be doing well but nonetheless continues to have issues with pressure and friction to her foot whenever she is not wearing the cast. Again today I think the wound is significantly improved and almost completely healed in 1 week's time after debridement last week. Nonetheless this has continued to be an issue for that reason we did do a MRI with contrast which really revealed no evidence whatsoever of an issue here. Again this is great but nonetheless still offers up the question what regularly to keep this from continuing  to just reopen every time that we see. Again I really do not know exactly what to do is I feel like this falls on the ground outside of wound care more into a issue more specifically with the orthopedic side of things. I understand the concern that a brace would probably be problematic for the patient. That makes complete sense to be honest. It would probably cause rubbing on other areas which would not be good. With that being said also I am not sure what else we can do other than  just keeping her in the cast which is really not a feasible option either. Every time I take her out of the cast this reopens almost immediately. 01/22/2021 upon evaluation today patient appears to be doing well with regard to her foot ulcer. This is going to require little bit of sharp debridement today. Fortunately there does not appear to be any signs of active infection at this time. No fevers, chills, nausea, vomiting, or diarrhea. I did previously last week tell the patient that I would contact Dr. Nona Dell office I did do so I spoke with Larkin Ina who is the physician assistant for Dr. Doran Durand. Subsequently he did relate to me that they really did not feel like there was anything that could be done differently for the patient. That is from an orthopedic standpoint. He said that the patient could make an appoint with Dr. Doran Durand to discuss further but he was not really sure that would make a big difference in the treatment plan to be honest. Subsequently he further did recommend that potentially the patient after I offered a thought on the potential for a Crow boot could benefit from this since she seems to do well with a total contact cast. With that being said he did advise that [place] would probably be the way to go for that if need be. 01/29/2021 upon evaluation today patient appears to be doing well with regard to her foot ulcer. In fact this pretty much appears to be healed. With that being said of course I am reluctant to absolutely call it healed and just discharge her due to things we have had going on in the past. I think we still need to monitor and watch out for this for that reason I am going to ensure nothing is hiding or potentially get a reopen honest by bringing her back next week after using some foam over the area today. 02/05/2021 on evaluation today patient's wound actually appears to be completely closed. I do not see anything open on anything even threatened open. With that being  said she did have drainage on her dressing it may appear that she had a small blister that occurred, rupture, and has resolved. Overall I do not see anything else concerning at this point. 02/12/2021 upon evaluation today patient actually still continues to maintain closure as far as her wound is concerned this is actually long as she has been without anything reopening. She has been extremely careful with that however. At this point I think she does need to start slowly and carefully increasing her activity with her shoes wants to get checked tomorrow. She has her appointment with Dr. Prudy Feeler next Wednesday and the following Wednesday with Dr. Doran Durand. 03/12/2021 upon evaluation today patient appears to be doing a little worse obviously and the fact that she has been healed for a bit of time and then subsequently reopened in the past week. We got her scheduled for a repeat appointment here  to try to get things back under control. Fortunately there does not appear to be any evidence of active infection. Unfortunately I do believe we need to clear away some of the callus and necrotic debris. Objective Constitutional Well-nourished and well-hydrated in no acute distress. Vitals Time Taken: 9:55 AM, Temperature: 98.2 F, Pulse: 71 bpm, Respiratory Rate: 16 breaths/min, Blood Pressure: 159/82 mmHg. Respiratory normal breathing without difficulty. Psychiatric this patient is able to make decisions and demonstrates good insight into disease process. Alert and Oriented x 3. pleasant and cooperative. General Notes: Upon inspection patient's wound bed actually showed signs of good granulation epithelization at this point. There does not appear to be any evidence of active infection which is great news and overall I am extremely pleased with where things stand today. I did have to perform sharp debridement clear away some of the callus and necrotic debris around the edges of the wound. Patient tolerated that  today without complication and postdebridement the wound bed appears to be doing significantly better. Integumentary (Hair, Skin) Wound #1RRRR status is Open. Original cause of wound was Gradually Appeared. The date acquired was: 05/11/2019. The wound has been in treatment 60 weeks. The wound is located on the [place]. The wound measures 0.3cm length x 0.3cm width x 0.2cm depth; 0.071cm^2 area and 0.014cm^3 volume. There is Fat Layer (Subcutaneous Tissue) exposed. There is no tunneling or undermining noted. There is a medium amount of serosanguineous drainage noted. The wound margin is distinct with the outline attached to the wound base. There is large (67-100%) pink, pale granulation within the wound bed. There is no necrotic tissue within the wound bed. Assessment Active Problems ICD-10 Type 2 diabetes mellitus with foot ulcer Non-pressure chronic ulcer of other part of left foot with fat layer exposed Charcot's joint, left ankle and foot Type 2 diabetes mellitus with diabetic neuropathy, unspecified Procedures Wound #1RRRR Pre-procedure diagnosis of Wound #1RRRR is a Diabetic Wound/Ulcer of the Lower Extremity located on the Left,Medial,Plantar Foot .Severity of Tissue Pre Debridement is: Fat layer exposed. There was a Selective/Open Wound Skin/Epidermis Debridement with a total area of 1 sq cm performed by Worthy Keeler, PA. With the following instrument(s): Curette to remove Viable and Non-Viable tissue/material. Material removed includes Callus and Skin: Epidermis and. No specimens were taken. A time out was conducted at 10:45, prior to the start of the procedure. A Minimum amount of bleeding was controlled with Pressure. The procedure was tolerated well with a pain level of 0 throughout and a pain level of 0 following the procedure. Post Debridement Measurements: 0.4cm length x 0.4cm width x 0.2cm depth; 0.025cm^3 volume. Character of Wound/Ulcer Post Debridement is  improved. Severity of Tissue Post Debridement is: Fat layer exposed. Post procedure Diagnosis Wound #1RRRR: Same as Pre-Procedure Plan Follow-up Appointments: Return Appointment in 1 week. Bathing/ Shower/ Hygiene: May shower and wash wound with soap and water. Off-Loading: Open toe surgical shoe to: - left foot at all times Additional Orders / Instructions: Follow Nutritious Diet WOUND #1RRRR: - Foot Wound Laterality: Plantar, Left, Medial Prim Dressing: KerraCel Ag Gelling Fiber Dressing, 2x2 in (silver alginate) 3 x Per Week/7 Days ary Discharge Instructions: Apply silver alginate to wound bed as instructed Secondary Dressing: Woven Gauze Sponges 2x2 in 3 x Per Week/7 Days Discharge Instructions: Apply over primary dressing as directed. Secondary Dressing: Optifoam Non-Adhesive Dressing, 4x4 in 3 x Per Week/7 Days Discharge Instructions: Apply over primary dressing cut to form foam donut Secured With: 25M Medipore H  Soft Cloth Surgical T 4 x 2 (in/yd) 3 x Per Week/7 Days ape Discharge Instructions: Secure dressing with tape as directed. 1. Would recommend currently that we go ahead and continue with the wound care measures as before and the patient is in agreement with plan. This includes the use of silver alginate which has in the past always done well for her I would recommend we continue as such. 2. I am also can recommend that we have the patient go ahead and continue with as well using a border foam dressing which I think helps to pad a little bit here. 3. I am also can recommend the patient continue to monitor for any signs of worsening from the standpoint of infection. Obviously I think that there is no evidence of infection right now but things can change obviously we want to keep things under control and prevent any worsening overall. We will see patient back for reevaluation in 1 week here in the clinic. If anything worsens or changes patient will contact our office for  additional recommendations. I am hoping to see her back next week to put a total contact cast on so that we can get things healed and back under control. She is in agreement with that plan. Electronic Signature(s) Signed: 03/12/2021 10:55:25 AM By: Worthy Keeler PA-C Entered By: Worthy Keeler on 03/12/2021 10:55:25 -------------------------------------------------------------------------------- SuperBill Details Patient Name: Date of Service: [name] [name], [name] RA E. 03/12/2021 Medical Record Number: [id number] Patient Account Number: [id number] Date of Birth/Sex: Treating RN: [birth year] (75 y.o. [name] Other Clinician: Referring Provider: Treating Provider/Extender: Adele Schilder in Treatment: 9 Diagnosis Coding ICD-10 Codes Code Description E11.621 Type 2 diabetes mellitus with foot ulcer L97.522 Non-pressure chronic ulcer of other part of left foot with fat layer exposed M14.672 Charcot's joint, left ankle and foot E11.40 Type 2 diabetes mellitus with diabetic neuropathy, unspecified Facility Procedures CPT4 Code: [id number] Description: [phone] - [facility] WOUND 1ST 20 SQ CM OR < ICD-10 Diagnosis Description L97.522 Non-pressure chronic ulcer of other part of left foot with fat layer exposed Modifier: Quantity: 1 Physician Procedures : CPT4 Code Description Modifier [id number] - WC PHYS LEVEL 4 - EST PT 25 ICD-10 Diagnosis Description E11.621 Type 2 diabetes mellitus with foot ulcer L97.522 Non-pressure chronic ulcer of other part of left foot with fat layer exposed M14.672  Charcot's joint, left ankle and foot E11.40 Type 2 diabetes mellitus with diabetic neuropathy, unspecified Quantity: 1 : [id number] - WC PHYS DEBR WO ANESTH 20 SQ CM ICD-10 Diagnosis Description L97.522 Non-pressure chronic ulcer of other part of left foot with fat layer exposed Quantity: 1 Electronic Signature(s) Signed: 03/12/2021 10:55:44 AM  By: Worthy Keeler PA-C Entered By: Worthy Keeler on 03/12/2021 10:55:44

## 2021-03-12 NOTE — Progress Notes (Signed)
SALISA, BROZ ([id number]) Visit Report for 03/12/2021 Arrival Information Details Patient Name: Date of Service: SARHA, BARTELT RA E. 03/12/2021 9:30 A M Medical Record Number: [id number] Patient Account Number: [id number] Date of Birth/Sex: Treating RN: Jul 30, 1946 (75 y.o. Sue Lush Primary Care Nashali Ditmer: [name] Other Clinician: Referring Gilbert Narain: Treating Abygail Galeno/Extender: Adele Schilder in Treatment: 9 Visit Information History Since Last Visit Added or deleted any medications: No Patient Arrived: Wheel Chair Any new allergies or adverse reactions: No Arrival Time: 09:51 Had a fall or experienced change in No Transfer Assistance: None activities of daily living that may affect Patient Identification Verified: Yes risk of falls: Secondary Verification Process Completed: Yes Signs or symptoms of abuse/neglect since last visito No Patient Requires Transmission-Based Precautions: No Hospitalized since last visit: No Patient Has Alerts: No Implantable device outside of the clinic excluding No cellular tissue based products placed in the center since last visit: Pain Present Now: No Electronic Signature(s) Signed: 03/12/2021 5:27:26 PM By: Lorrin Jackson Entered By: Lorrin Jackson on 03/12/2021 09:53:52 -------------------------------------------------------------------------------- Lower Extremity Assessment Details Patient Name: Date of Service: Yeats, [name] 03/12/2021 9:30 A M Medical Record Number: [id number] Patient Account Number: [id number] Date of Birth/Sex: Treating RN: 29-May-1946 (75 y.o. Sue Lush Primary Care [name] [name]: [name] Other Clinician: Referring Markis Langland: Treating Dajai Wahlert/Extender: Landis Martins Weeks in Treatment: 9 Edema Assessment Assessed: [Left: Yes] [Right: No] Edema: [Left: N] [Right: o] Calf Left: Right: Point of Measurement: 30 cm From Medial Instep 32.5 cm Ankle Left:  Right: Point of Measurement: 9 cm From Medial Instep 23 cm Vascular Assessment Pulses: Dorsalis Pedis Palpable: [Left:Yes] Electronic Signature(s) Signed: 03/12/2021 5:27:26 PM By: Lorrin Jackson Entered By: Lorrin Jackson on 03/12/2021 10:02:13 -------------------------------------------------------------------------------- Multi-Disciplinary Care Plan Details Patient Name: Date of Service: [name] 03/12/2021 9:30 A M Medical Record Number: [id number] Patient Account Number: [id number] Date of Birth/Sex: Treating RN: [birth year] (75 y.o. [name] Primary Care Ned Kakar: [name] Other Clinician: Referring Kelcey Wickstrom: Treating [place]/Extender: Adele Schilder in Treatment: [place] reviewed with physician Active Inactive Wound/Skin Impairment Nursing Diagnoses: Impaired tissue integrity Knowledge deficit related to ulceration/compromised skin integrity Goals: Patient/caregiver will verbalize understanding of skin care regimen Date Initiated: 01/08/2021 Target Resolution Date: 04/09/2021 Goal Status: Active Ulcer/skin breakdown will have a volume reduction of 30% by week 4 Date Initiated: 01/08/2021 Date Inactivated: 02/05/2021 Target Resolution Date: 02/05/2021 Goal Status: Met Ulcer/skin breakdown will have a volume reduction of 50% by week 8 Date Initiated: 02/05/2021 Date Inactivated: 02/12/2021 Target Resolution Date: 03/05/2021 Goal Status: Met Interventions: Assess patient/caregiver ability to obtain necessary supplies Assess patient/caregiver ability to perform ulcer/skin care regimen upon admission and as needed Provide education on ulcer and skin care Treatment Activities: Skin care regimen initiated : 01/08/2021 Topical wound management initiated : 01/08/2021 Notes: Electronic Signature(s) Signed: 03/12/2021 5:57:23 PM By: [name] RN, BSN Entered By: [name] on 03/12/2021  09:52:47 -------------------------------------------------------------------------------- Pain Assessment Details Patient Name: Date of Service: [name] 03/12/2021 9:30 A M Medical Record Number: [id number] Patient Account Number: [id number] Date of Birth/Sex: Treating RN: 1946/07/23 (75 y.o. Sue Lush Primary Care Vonnetta Akey: [name] Other Clinician: Referring Pebble Botkin: Treating Craig Wisnewski/Extender: Adele Schilder in Treatment: 9 Active Problems Location of Pain Severity and Description of Pain Patient Has Paino No Site Locations Pain Management and Medication Current Pain Management: Electronic Signature(s) Signed: 03/12/2021 5:27:26 PM By:  Lorrin Jackson Entered By: Lorrin Jackson on 03/12/2021 09:56:13 -------------------------------------------------------------------------------- Patient/Caregiver Education Details Patient Name: Date of Service: [place], [facility] RBA RA E. 8/3/2022andnbsp9:30 A M Medical Record Number: [id number] Patient Account Number: [id number] Date of Birth/Gender: Treating RN: 08/15/1945 (75 y.o. [name] Primary Care Physician: [name] Other Clinician: Referring Physician: Treating Physician/Extender: Adele Schilder in Treatment: 9 Education Assessment Education Provided To: Patient Education Topics Provided Offloading: Methods: Explain/Verbal Responses: Reinforcements needed, State content correctly Wound/Skin Impairment: Methods: Explain/Verbal Responses: Reinforcements needed, State content correctly Electronic Signature(s) Signed: 03/12/2021 5:57:23 PM By: [name] RN, BSN Entered By: [name] on 03/12/2021 09:53:11 -------------------------------------------------------------------------------- Wound Assessment Details Patient Name: Date of Service: [name] 03/12/2021 9:30 A M Medical Record Number: [id number] Patient Account Number: [id number] Date  of Birth/Sex: Treating RN: 1946/05/20 (75 y.o. Sue Lush Primary Care Klever Twyford: [name] Other Clinician: Referring Kambrie Eddleman: Treating Ramia Sidney/Extender: Landis Martins Weeks in Treatment: 9 Wound Status Wound Number: [id number] Primary Diabetic Wound/Ulcer of the Lower Extremity Etiology: Wound Location: Left, Medial, Plantar Foot Wound Open Wounding Event: Gradually Appeared Status: Date Acquired: 05/11/2019 Comorbid Sleep Apnea, Deep Vein Thrombosis, Hypertension, Colitis, Type Weeks Of Treatment: 60 History: II Diabetes, Gout, Osteoarthritis, Neuropathy Clustered Wound: No Photos Wound Measurements Length: (cm) 0.3 Width: (cm) 0.3 Depth: (cm) 0.2 Area: (cm) 0.071 Volume: (cm) 0.014 % Reduction in Area: 93% % Reduction in Volume: 96.6% Epithelialization: Large (67-100%) Tunneling: No Undermining: No Wound Description Classification: Grade 1 Wound Margin: Distinct, outline attached Exudate Amount: Medium Exudate Type: Serosanguineous Exudate Color: red, brown Foul Odor After Cleansing: No Slough/Fibrino No Wound Bed Granulation Amount: Large (67-100%) Exposed Structure Granulation Quality: Pink, Pale Fascia Exposed: No Necrotic Amount: None Present (0%) Fat Layer (Subcutaneous Tissue) Exposed: Yes Tendon Exposed: No Muscle Exposed: No Joint Exposed: No Bone Exposed: No Electronic Signature(s) Signed: 03/12/2021 5:27:26 PM By: Lorrin Jackson Entered By: Lorrin Jackson on 03/12/2021 10:02:48 -------------------------------------------------------------------------------- Vitals Details Patient Name: Date of Service: Almendarez, [name] 03/12/2021 9:30 A M Medical Record Number: [id number] Patient Account Number: [id number] Date of Birth/Sex: Treating RN: 1946-02-07 (75 y.o. Sue Lush Primary Care Dilynn Munroe: [name] Other Clinician: Referring Jayce Boyko: Treating Raeanne Deschler/Extender: Landis Martins Weeks in  Treatment: 9 Vital Signs Time Taken: 09:55 Temperature (F): 98.2 Pulse (bpm): 71 Respiratory Rate (breaths/min): 16 Blood Pressure (mmHg): 159/82 Reference Range: 80 - 120 mg / dl Electronic Signature(s) Signed: 03/12/2021 5:27:26 PM By: Lorrin Jackson Entered By: Lorrin Jackson on 03/12/2021 09:56:05

## 2021-03-13 ENCOUNTER — Telehealth: Payer: Self-pay | Admitting: Internal Medicine

## 2021-03-13 NOTE — Telephone Encounter (Signed)
PA REQUEST

## 2021-03-18 ENCOUNTER — Encounter: Payer: Self-pay | Admitting: Internal Medicine

## 2021-03-19 ENCOUNTER — Other Ambulatory Visit: Payer: Self-pay

## 2021-03-19 ENCOUNTER — Encounter (HOSPITAL_BASED_OUTPATIENT_CLINIC_OR_DEPARTMENT_OTHER): Payer: Medicare Other | Admitting: Physician Assistant

## 2021-03-19 DIAGNOSIS — L97522 Non-pressure chronic ulcer of other part of left foot with fat layer exposed: Secondary | ICD-10-CM | POA: Diagnosis not present

## 2021-03-19 DIAGNOSIS — E11621 Type 2 diabetes mellitus with foot ulcer: Secondary | ICD-10-CM | POA: Diagnosis not present

## 2021-03-19 DIAGNOSIS — E1140 Type 2 diabetes mellitus with diabetic neuropathy, unspecified: Secondary | ICD-10-CM | POA: Diagnosis not present

## 2021-03-19 DIAGNOSIS — E1151 Type 2 diabetes mellitus with diabetic peripheral angiopathy without gangrene: Secondary | ICD-10-CM | POA: Diagnosis not present

## 2021-03-19 MED ORDER — LEVEMIR FLEXTOUCH 100 UNIT/ML ~~LOC~~ SOPN: 20.0000 [IU] | PEN_INJECTOR | Freq: Two times a day (BID) | SUBCUTANEOUS | 3 refills | Status: DC

## 2021-03-19 NOTE — Progress Notes (Signed)
SHANTAI, TIEDEMAN ([id number]) Visit Report for 03/19/2021 Arrival Information Details Patient Name: Date of Service: [name] [name], [name] E. 03/19/2021 8:15 A M Medical Record Number: [id number] Patient Account Number: [id number] Date of Birth/Sex: Treating RN: 02-12-1946 (75 y.o. [name] [name] Primary Care Kimari Coudriet: [name] Other Clinician: Referring Nalu Troublefield: Treating Armando Lauman/Extender: Adele Schilder in Treatment: 10 Visit Information History Since Last Visit Added or deleted any medications: No Patient Arrived: Wheel Chair Any new allergies or adverse reactions: No Arrival Time: 08:16 Had a fall or experienced change in No Transfer Assistance: None activities of daily living that may affect Patient Identification Verified: Yes risk of falls: Secondary Verification Process Completed: Yes Signs or symptoms of abuse/neglect since last visito No Patient Requires Transmission-Based Precautions: No Hospitalized since last visit: No Patient Has Alerts: No Implantable device outside of the clinic excluding No cellular tissue based products placed in the center since last visit: Has Dressing in Place as Prescribed: Yes Pain Present Now: No Electronic Signature(s) Signed: 03/19/2021 3:42:23 PM By: Lorrin Jackson Entered By: Lorrin Jackson on 03/19/2021 08:20:48 -------------------------------------------------------------------------------- Clinic Level of Care Assessment Details Patient Name: Date of Service: Annunziato, [name] 03/19/2021 8:15 A M Medical Record Number: [id number] Patient Account Number: [id number] Date of Birth/Sex: Treating RN: Feb 04, 1946 (75 y.o. [name] [name] Primary Care Larose Batres: [name] Other Clinician: Referring Charels Stambaugh: Treating Parry Po/Extender: Adele Schilder in Treatment: 10 Clinic Level of Care Assessment Items TOOL 4 Quantity Score []  - 0 Use when only an EandM is performed on FOLLOW-UP  visit ASSESSMENTS - Nursing Assessment / Reassessment X- 1 10 Reassessment of Co-morbidities (includes updates in patient status) X- 1 5 Reassessment of Adherence to Treatment Plan ASSESSMENTS - Wound and Skin A ssessment / Reassessment X - Simple Wound Assessment / Reassessment - one wound 1 5 []  - 0 Complex Wound Assessment / Reassessment - multiple wounds []  - 0 Dermatologic / Skin Assessment (not related to wound area) ASSESSMENTS - Focused Assessment []  - 0 Circumferential Edema Measurements - multi extremities []  - 0 Nutritional Assessment / Counseling / Intervention X- 1 5 Lower Extremity Assessment (monofilament, tuning fork, pulses) []  - 0 Peripheral Arterial Disease Assessment (using hand held doppler) ASSESSMENTS - Ostomy and/or Continence Assessment and Care []  - 0 Incontinence Assessment and Management []  - 0 Ostomy Care Assessment and Management (repouching, etc.) PROCESS - Coordination of Care X - Simple Patient / Family Education for ongoing care 1 15 []  - 0 Complex (extensive) Patient / Family Education for ongoing care X- 1 10 Staff obtains Programmer, systems, Records, T Results / Process Orders est []  - 0 Staff telephones HHA, Nursing Homes / Clarify orders / etc []  - 0 Routine Transfer to another Facility (non-emergent condition) []  - 0 Routine Hospital Admission (non-emergent condition) []  - 0 New Admissions / Biomedical engineer / Ordering NPWT Apligraf, etc. , []  - 0 Emergency Hospital Admission (emergent condition) X- 1 10 Simple Discharge Coordination []  - 0 Complex (extensive) Discharge Coordination PROCESS - Special Needs []  - 0 Pediatric / Minor Patient Management []  - 0 Isolation Patient Management []  - 0 Hearing / Language / Visual special needs []  - 0 Assessment of Community assistance (transportation, D/C planning, etc.) []  - 0 Additional assistance / Altered mentation []  - 0 Support Surface(s) Assessment (bed, cushion, seat,  etc.) INTERVENTIONS - Wound Cleansing / Measurement X - Simple Wound Cleansing - one wound 1 5 []  - 0 Complex Wound Cleansing -  multiple wounds X- 1 5 Wound Imaging (photographs - any number of wounds) $RemoveBe'[]'MgbiXeIdY$  - 0 Wound Tracing (instead of photographs) X- 1 5 Simple Wound Measurement - one wound $RemoveB'[]'OyHcHaIa$  - 0 Complex Wound Measurement - multiple wounds INTERVENTIONS - Wound Dressings X - Small Wound Dressing one or multiple wounds 1 10 $Re'[]'qLJ$  - 0 Medium Wound Dressing one or multiple wounds $RemoveBeforeD'[]'yXIAEXEStdjkrd$  - 0 Large Wound Dressing one or multiple wounds X- 1 5 Application of Medications - topical $RemoveB'[]'VGpAKpGl$  - 0 Application of Medications - injection INTERVENTIONS - Miscellaneous $RemoveBeforeD'[]'SCmxaqhEOCgoSz$  - 0 External ear exam $Remove'[]'bGgXlIw$  - 0 Specimen Collection (cultures, biopsies, blood, body fluids, etc.) $RemoveBefor'[]'cXFWLoJzYygu$  - 0 Specimen(s) / Culture(s) sent or taken to Lab for analysis $RemoveBefo'[]'hXeSIZRUCWS$  - 0 Patient Transfer (multiple staff / Civil Service fast streamer / Similar devices) $RemoveBeforeDE'[]'TnOSxXytEXeArOm$  - 0 Simple Staple / Suture removal (25 or less) $Remove'[]'JHZvIzV$  - 0 Complex Staple / Suture removal (26 or more) $Remove'[]'YcgMRhA$  - 0 Hypo / Hyperglycemic Management (close monitor of Blood Glucose) $RemoveBefore'[]'YXlOvKqfFVOIT$  - 0 Ankle / Brachial Index (ABI) - do not check if billed separately X- 1 5 Vital Signs Has the patient been seen at the hospital within the last three years: Yes Total Score: 95 Level Of Care: New/Established - Level 3 Electronic Signature(s) Signed: 03/19/2021 3:55:35 PM By: [name] RN, BSN Entered By: [name] on 03/19/2021 08:53:02 -------------------------------------------------------------------------------- Lower Extremity Assessment Details Patient Name: Date of Service: Like, [name] 03/19/2021 8:15 A M Medical Record Number: [id number] Patient Account Number: [id number] Date of Birth/Sex: Treating RN: Jun 12, 1946 (75 y.o. [name] [name] Primary Care Elianny Buxbaum: [name] Other Clinician: Referring Satoshi Kalas: Treating Raphaela Cannaday/Extender: Landis Martins Weeks in Treatment:  10 Edema Assessment Assessed: [Left: Yes] [Right: No] Edema: [Left: N] [Right: o] Calf Left: Right: Point of Measurement: 30 cm From Medial Instep 31.4 cm Ankle Left: Right: Point of Measurement: 9 cm From Medial Instep 23.4 cm Vascular Assessment Pulses: Dorsalis Pedis Palpable: [Left:Yes] Electronic Signature(s) Signed: 03/19/2021 3:42:23 PM By: Lorrin Jackson Entered By: Lorrin Jackson on 03/19/2021 08:25:35 -------------------------------------------------------------------------------- Multi-Disciplinary Care Plan Details Patient Name: Date of Service: [name] 03/19/2021 8:15 A M Medical Record Number: [id number] Patient Account Number: [id number] Date of Birth/Sex: Treating RN: 1945/12/27 (75 y.o. [name] [name] Primary Care Mathayus Stanbery: [name] Other Clinician: Referring Dallis Darden: Treating Danzell Birky/Extender: Adele Schilder in Treatment: [place] reviewed with physician Active Inactive Wound/Skin Impairment Nursing Diagnoses: Impaired tissue integrity Knowledge deficit related to ulceration/compromised skin integrity Goals: Patient/caregiver will verbalize understanding of skin care regimen Date Initiated: 01/08/2021 Target Resolution Date: 04/09/2021 Goal Status: Active Ulcer/skin breakdown will have a volume reduction of 30% by week 4 Date Initiated: 01/08/2021 Date Inactivated: 02/05/2021 Target Resolution Date: 02/05/2021 Goal Status: Met Ulcer/skin breakdown will have a volume reduction of 50% by week 8 Date Initiated: 02/05/2021 Date Inactivated: 02/12/2021 Target Resolution Date: 03/05/2021 Goal Status: Met Interventions: Assess patient/caregiver ability to obtain necessary supplies Assess patient/caregiver ability to perform ulcer/skin care regimen upon admission and as needed Provide education on ulcer and skin care Treatment Activities: Skin care regimen initiated : 01/08/2021 Topical wound management  initiated : 01/08/2021 Notes: Electronic Signature(s) Signed: 03/19/2021 3:55:35 PM By: [name] RN, BSN Entered By: [name] on 03/19/2021 08:49:41 -------------------------------------------------------------------------------- Pain Assessment Details Patient Name: Date of Service: [name] 03/19/2021 8:15 A M Medical Record Number: [id number] Patient Account Number: [id number] Date of Birth/Sex: Treating RN: [birth year] (75 y.o. [name] [name]  Primary Care Dracen Reigle: [name] Other Clinician: Referring Joanathan Affeldt: Treating Mackenzi Krogh/Extender: Landis Martins Weeks in Treatment: 10 Active Problems Location of Pain Severity and Description of Pain Patient Has Paino No Site Locations Pain Management and Medication Current Pain Management: Electronic Signature(s) Signed: 03/19/2021 3:42:23 PM By: Lorrin Jackson Entered By: Lorrin Jackson on 03/19/2021 08:21:32 -------------------------------------------------------------------------------- Patient/Caregiver Education Details Patient Name: Date of Service: [name] 8/10/2022andnbsp8:15 A M Medical Record Number: [id number] Patient Account Number: [id number] Date of Birth/Gender: Treating RN: 12/06/1945 (75 y.o. [name] [name] Primary Care Physician: [name] Other Clinician: Referring Physician: Treating Physician/Extender: Adele Schilder in Treatment: 10 Education Assessment Education Provided To: Patient Education Topics Provided Offloading: Methods: Explain/Verbal Responses: Reinforcements needed, State content correctly Wound/Skin Impairment: Methods: Explain/Verbal Responses: Reinforcements needed, State content correctly Electronic Signature(s) Signed: 03/19/2021 3:55:35 PM By: [name] RN, BSN Entered By: [name] on 03/19/2021 08:50:06 -------------------------------------------------------------------------------- Wound  Assessment Details Patient Name: Date of Service: [name] 03/19/2021 8:15 A M Medical Record Number: [id number] Patient Account Number: [id number] Date of Birth/Sex: Treating RN: 09/24/1945 (75 y.o. [name] [name] Primary Care Drevon Plog: [name] Other Clinician: Referring Nadia Viar: Treating Betsy Rosello/Extender: Landis Martins Weeks in Treatment: 10 Wound Status Wound Number: [id number] Primary Diabetic Wound/Ulcer of the Lower Extremity Etiology: Wound Location: Left, Medial, Plantar Foot Wound Open Wounding Event: Gradually Appeared Status: Date Acquired: 05/11/2019 Comorbid Sleep Apnea, Deep Vein Thrombosis, Hypertension, Colitis, Type Weeks Of Treatment: 61 History: II Diabetes, Gout, Osteoarthritis, Neuropathy Clustered Wound: No Photos Wound Measurements Length: (cm) 0.3 Width: (cm) 0.2 Depth: (cm) 0.2 Area: (cm) 0.047 Volume: (cm) 0.009 % Reduction in Area: 95.4% % Reduction in Volume: 97.8% Epithelialization: Large (67-100%) Tunneling: No Undermining: No Wound Description Classification: Grade 1 Wound Margin: Distinct, outline attached Exudate Amount: Medium Exudate Type: Serosanguineous Exudate Color: red, brown Foul Odor After Cleansing: No Slough/Fibrino No Wound Bed Granulation Amount: Large (67-100%) Exposed Structure Granulation Quality: Pink, Pale Fascia Exposed: No Necrotic Amount: None Present (0%) Fat Layer (Subcutaneous Tissue) Exposed: Yes Tendon Exposed: No Muscle Exposed: No Joint Exposed: No Bone Exposed: No Electronic Signature(s) Signed: 03/19/2021 3:42:23 PM By: Lorrin Jackson Entered By: Lorrin Jackson on 03/19/2021 08:27:51 -------------------------------------------------------------------------------- Vitals Details Patient Name: Date of Service: [place], [name] 03/19/2021 8:15 A M Medical Record Number: [id number] Patient Account Number: [id number] Date of Birth/Sex: Treating RN: 1945/08/15 (75 y.o.  [name] [name] Primary Care Veola Cafaro: [name] Other Clinician: Referring Shivaay Stormont: Treating Brady Schiller/Extender: Landis Martins Weeks in Treatment: 10 Vital Signs Time Taken: 08:20 Temperature (F): 98.6 Pulse (bpm): 79 Respiratory Rate (breaths/min): 18 Blood Pressure (mmHg): 137/79 Reference Range: 80 - 120 mg / dl Electronic Signature(s) Signed: 03/19/2021 3:42:23 PM By: Lorrin Jackson Entered By: Lorrin Jackson on 03/19/2021 08:21:09

## 2021-03-19 NOTE — Progress Notes (Addendum)
SHEYLY, BAYONA ([id number]) Visit Report for 03/19/2021 Chief Complaint Document Details Patient Name: Date of Service: [name] [name], [name] E. 03/19/2021 8:15 A M Medical Record Number: [id number] Patient Account Number: [id number] Date of Birth/Sex: Treating RN: [birth year] (75 y.o. [name] Other Clinician: Referring Provider: Treating Provider/Extender: Landis Martins Weeks in Treatment: 10 Information Obtained from: Patient Chief Complaint Left foot ulcer Electronic Signature(s) Signed: 03/19/2021 8:37:56 AM By: Worthy Keeler PA-C Entered By: Worthy Keeler on 03/19/2021 08:37:56 -------------------------------------------------------------------------------- HPI Details Patient Name: Date of Service: [name] [name], [name] RA E. 03/19/2021 8:15 A M Medical Record Number: [id number] Patient Account Number: [id number] Date of Birth/Sex: Treating RN: 01/25/1946 (75 y.o. [name] Other Clinician: Referring Provider: Treating Provider/Extender: Adele Schilder in Treatment: 10 History of Present Illness HPI Description: 01/17/2020 upon evaluation today patient presents for initial evaluation here in our clinic concerning issues she has been having with a left medial/plantar foot ulcer. This is actually been an issue for her since October 2020. She has been seeing Dr. Doran Durand for quite some time during that course. Fortunately there is no signs of active infection at this time. Or least no mention of this to have seen in general. With that being said unfortunately I do see some signs of erythema noted today that does have me concerned about the possibility of infection at this point in the surrounding area of the wound. There is also a warm to touch at the site which is somewhat concerning. Fortunately there is no evidence of systemic infection which is great news. The patient  does have a history of diabetes mellitus type 2, Charcot foot which is what led to the wound, and hypertension. She notes that she was in a cast for some time with Dr. Doran Durand for about 8 weeks. During that time they were utilizing according to the patient silver nitrate along with a foam doughnut and then Coban to secure in place in the cast in place. With that being said I do not have the actual records to review we are going to try to get a hold of those unfortunately they would not flow over into care everywhere I did look today. She has been seeing Dr. Doran Durand and his physician assistant Larkin Ina up until the end of May and apparently is still seeing them on a regular basis every 2 weeks roughly. She has also tried Iodosorb without effect here. 01/24/2020 upon evaluation today patient actually appears to be doing quite well with regard to her wounds. She has been tolerating the dressing changes without complication. Fortunately there is no signs of active infection spreading which is good news. Her culture did show signs of Staph aureus I did place her on Augmentin due to the erythema surrounding the wound. With that being said the wound does appear to be doing better she has her longer walking cast/boot and I think that is actually good for her for the time being. I am considering reinitiating total contact cast when she gets back from vacation but next week she will actually be out of town at [employer] she knows not to get in the water but she still obviously is planning to enjoy herself she is going to take it easy on her foot however. 02/07/2020 upon evaluation today patient appears to be doing fairly well in regard to her ulcer on her foot. Fortunately there is no  signs of severe infection at this time which is great news and overall very pleased in that regard. With that being said I do think that she could still benefit from a total contact cast. Nonetheless she is using her walking boot which at  least provide some protection and that it prevents some of the friction occurring when she is ambulating. 02/14/2020 upon evaluation today patient appears to be doing well with regard to her foot ulcer. This is actually measuring a little bit smaller yet again this week. Overall very pleased with where things stand and I do not see any signs of active infection at this time which is also good news. Since she is measuring better the patient has wanting to somewhat hold off on proceeding with the total contact cast which I think is reasonable at this point. 02/28/2020 on evaluation today patient appears to be doing well in general in regard to her wound although she has a lot of callus buildup as compared to last time I saw her. This is can require sharp debridement today. I do believe she really needs the total contact cast as well which we have discussed previous. 7/23; patient comes in for a total contact cast change 03/06/2020 on evaluation today patient appears to be doing quite well with regard to her wounds. Fortunately the wound bed is measuring smaller and looking much better there is little callus noted although there is some debridement necessary today. 03/13/2020 on evaluation today patient's wound actually appears to be doing excellent which is great news. With that being said unfortunately she is having some issues currently with her left leg where she does have cellulitis it appears. This may have come from an area that rubbed underneath the cast from last week that we noted we padded that area and it looks to be doing excellent at this point but nonetheless the leg was somewhat painful, swollen, and somewhat erythematous. She also had an elevated white blood cell count of 11.5 based on what I saw on looking at her records from the med center in [facility] from where she was seen yesterday. Unfortunately with [place] having a provider on vacation there was no one here in the clinic in the afternoon  when she called therefore she went to the ER as advised. Subsequently they did not cut off the cast as they did not have anyone from orthopedics there to do so and subsequently also did not have the ability to do the Doppler for evaluation of DVT They recommended therefore given her dose of Eliquis as well as . Augmentin and sent her home to come see [place] today to have the cast taken off and then she is supposed to go back to have the study for DVT performed they are following. 03/20/2020 upon evaluation today patient appears to be doing well with regard to her foot all things considered we have not been able to use the total contact cast due to the infection that she had last week. She has been on the doxycycline and she had a 10-day supply of that I do believe that is helping and her leg appears to be doing better. With that being said there is fortunately no signs of active infection systemically at this time which is good news. No fevers, chills, nausea, vomiting, or diarrhea. 03/27/2020 upon evaluation today patient appears to be doing well with regard to her foot ulcer. There does not appear to be signs of active infection which is great news.  Overall I am very pleased with where things stand at this point. 04/03/2020 upon evaluation today patient appears to be doing pretty well in regard to the overall appearance of her wound. Fortunately there is no signs of active infection at this time which is great news. No fevers, chills, nausea, vomiting, or diarrhea. With that being said she does have some blue-green drainage that actually is a little bit concerning to me for the possibility of Pseudomonas. I discussed that with the patient today. With that being said I do believe that we may be able to manage this however with the topical antibiotic cream as opposed to having to do anything oral especially since she seems to be doing so well with overall appearance of the wound. 04/10/2020 on evaluation  today patient appears to be doing about the same roughly in regard to the overall size of her wound. With that being said she fortunately has not shown any signs of worsening overall which is good news. I do believe that she is doing a great job trying to offload but again she may still do better with the cast. I do not see in the blue-green drainage that we noticed previously I do believe the gentamicin help in this regard. 04/17/2020 on evaluation today patient's wound appears to be doing about the same at this point. There is no significant improvement at this point. No fever chills noted. She is up for put the cast back on the day. That she states in a couple weeks she will need to have this off to go to a workshop. 04/24/2020 on evaluation today patient appears to be doing significantly better in regard to her wound. Fortunately there is no signs of active infection and overall feel like she is making great progress the cast seems to have done excellent for her. 05/01/2020 upon evaluation today patient presents for reevaluation she really does not appear to be doing too badly in regard to the actual wound on the left foot we have been managing. Unfortunately she has bilateral lower extremity edema with blisters between the webspace of her first and second toe on both feet. She has a tremendous amount of edema in the legs which I think is where this is coming from it does not appear to be infected but nonetheless I do believe this is can be something that needs to be addressed today. Obviously this means we probably will not be putting the cast on at this point. She attributes this to the fact that she was sitting with her feet on the floor much longer during a conference last week she had a great time but unfortunately had a lot of complications as a result. 05/08/2020 upon evaluation today patient appears to be doing somewhat better in regard to her wounds at this time. Fortunately there is no signs of  active infection which is great news. With that being said I do believe that the blisters have ruptured and unfortunately did not just reattach I will remove some of the blistered tissue today. With that being said I do think the wound itself on the plantar aspect of left foot does need to have sharp debridement. 05/15/2020 upon evaluation today patient appears to be doing about the same in regard to her foot ulcer. Unfortunately in the past week her husband had a fall where he sustained a mild traumatic brain bleed. Fortunately he is doing better but being that he was in the hospital she had a walk on this a  lot more. The wound does not appear to be any better is also not really appearing to be significantly worse which is good news. There is no signs of active infection at this time. 10/14; patient with a small diabetic wound on the medial part of her left foot. We have been using silver collagen a total contact cast making good progress. I think the patient had a series of blisters on her dorsal foot probably secondary to having her legs recumbent for 3 days while in a conference in [place]. We wrapped her leg last week these are all healed. We did not previously have her in compression on the right leg. 05/29/2020 upon evaluation today patient appears to be doing well with regard to the wound on the plantar aspect of her foot medially. This is measuring smaller and looking much better than last time I saw her. Again when I did see her last was 2 weeks back and the wound was significantly larger. I do believe the cast is helping and I believe the collagen is a good option for her. 06/05/2020 on evaluation today patient appears to be doing well with regard to her foot ulcer this is actually measuring significantly better and overall I feel like she is doing excellent. There is no signs of active infection at this time. 06/12/2020 upon evaluation today patient actually continues to show signs of good  improvement which is excellent news. I am extremely pleased with how she seems to be progressing at this point in regard to her wound. There is still some depth to the wound but I do believe the collagen is helping her quite a bit. 06/19/2020 upon evaluation today patient appears to be doing well with regard to her plantar foot ulcer. She is actually making excellent progress and in fact this appears to be almost completely healed. With that being said I do believe that the patient is going to actually require 1 more week in the cast although after that I am hopeful she will be ready for discharge. 06/26/2020 on evaluation today patient appears to be doing well in regard to her wound currently. Fortunately there is no signs of active infection in general I feel like she is doing excellent. This appears to be completely healed I think she is ready to come out of the cast. 11/29; patient comes back in the clinic today with a very quick reopening in the exact same area on the medial plantar foot. She had been healed out last time. She went back into some new balance shoes that she got at [employer] with a custom insert. As it turns out this wound also happened when wearing these shoes although there was some modification made I think with the wound initially happened they added some foam around the wound area. This obviously is not going to be sufficient. 07/17/2020 on evaluation today patient appears to be doing well with regard to her wound. Fortunately she seems to be making good progress. Unfortunately she was in the hospital due to a issue with colitis and had just been discharged today in fact. She tells me that her biggest concern here is that a lot of her numbers especially her creatinine were somewhat elevated and problematic. She is can be following up with her provider in order to have a further work-up at this time. With that being said she did need to come back in for a cast change. She did not  allow them to remove the cast due to the  fact that she did not feel like that was the issue whatsoever and indeed it does not appear that was the case her wound appears to be doing excellent today. 07/24/2020 upon evaluation today patient appears to be doing well in regard to her foot ulcer. She still has a small opening but this is showing signs of excellent improvement overall but they were very close to complete resolution. No fevers, chills, nausea, vomiting, or diarrhea. 07/31/2020 upon evaluation today patient actually appears to be doing excellent she is actually completely healed this is great news. Fortunately there is no signs of active infection at this time. No fevers, chills, nausea, vomiting, or diarrhea. The patient tells me that she did see Dr. Paulla Dolly in order to get to [place] so that she can have a custom shoe made. She saw him earlier last week. She does have an appointment with Liliane Channel on the sixth I believe on January she tells me Readmission: 08/28/2020 on evaluation today patient appears to be doing poorly in regard to her wound currently. She tells me this has reopened. The good news that she did get measured for and actually her shoes are on the way in from [facility].. With that being said she has been trying to stay off of this is much as possible using her wheelchair around home. Nonetheless has been somewhat difficult. The good news is her shoes should be here next week 09/04/2020 upon evaluation today patient appears to be doing well with regard to her wound. There is a little bit of callus buildup but nothing too significant she does tell me she was very active this week. Fortunately there is no signs of systemic infection at this point. The dorsal foot wound actually appears to be doing better. I think this is healed. 09/11/2020 upon evaluation today patient appears to actually be doing quite well in my opinion based on what I am seeing today. Fortunately there is no signs  of infection in fact her mother is certain that this is even open any longer based on what I see. Fortunately I think the patient has been doing everything she can to try to keep this under control. 09/18/2020 upon evaluation today patient appears to be doing about the same in regard to her foot ulcer. She has been tolerating the dressing changes without complication. Fortunately there is no sign of active infection at this time. No fevers, chills, nausea, vomiting, or diarrhea. She is going require some sharp debridement today. 09/25/2020 upon evaluation today patient appears to be doing well with regard to his wounds she has been tolerating the dressing changes without complication. Her wound appears to be completely healed based on what I am seeing at this point. There does not appear to be any signs of active infection at this time which is great news. No fevers, chills, nausea, vomiting, or diarrhea. She did get the cushion for her foot as well that she ordered to try to help keep pressure off of this area. That looks like it may be very beneficial for her to be honest. 10/02/2020 upon evaluation today patient appears to be doing more poorly in regard to her foot ulcer. Unfortunately this has reopened since we saw her last week. It apparently did not take too long at all for this to happen. She is obviously somewhat disappointed as she was hopeful that that would be time this thing would stay closed. Fortunately there is no evidence of active infection at this point. No fevers,  chills, nausea, vomiting, or diarrhea. With that being said I am good have to perform a little bit of sharp debridement clear away some of the debris currently. Including a minimal amount of callus at this time. 10/16/2020 upon evaluation today patient appears to be doing about the same in regard to her foot ulcer unfortunately. There does not appear to be any signs of active infection which is great news. No fever chills noted  she has been tolerating the dressing changes without complication which is great news. Unfortunately she tells me that she has been very depressed about the situation is getting very frustrating to her that she continues to have issues despite everything that she is trying to do to stay off of her foot. She is extremely discouraged and I do hate to hear this. Obviously we have been hopeful that if she got her shoes that would make a difference she is actually can be getting those tomorrow but I am not certain that that alone is good to be enough to get this to heal. We may end up having to consider going back into a total contact cast to get this to heal and then subsequently once we get her healed get her into her new diabetic shoes which will and my hope anyway keep this from reopening again. 10/23/2020 upon evaluation today patient appears to be doing a little worse both in regard to the size of her wound as well as in regard to the fact that she has erythema surrounding the wound and wrap around the lateral part of her foot where she tells me has been somewhat sore. Fortunately there does not appear to be any evidence of infection systemically but locally definitely there is erythema and warmth consistent with local cellulitis. I am glad to remove some of the callus as well. 10/30/2020 upon evaluation today patient appears to be doing well with regard to her wound on the plantar aspect of her foot. This is significantly improved compared to last week. 10/30/2020 upon evaluation today patient actually is making excellent progress her wound appears to show signs of great improvement which is wonderful and that she is extremely pleased to hear this. She actually leaves Sunday to go on her trip with her half sister and friends. 11/13/2020 on evaluation today patient's wound actually appears to be doing quite well. There does not appear to be any signs of infection it has been 2 weeks since have seen her  she does have a little bit of callus buildup here today but at the same time I do not believe the vacation time set her back any just has not really made a lot of significant improvement. She is done with her antibiotics at this point. 11/20/2020 upon evaluation today patient appears to be doing poorly in regard to her foot. In fact this appears to be showing signs of Infection. She has erythema and warmth that is concerning. I know she is very discouraged as this seems to be a recurrent issue. I think we may need to delve further into the possibility of something deeper going on here as far as a structural infection. 11/27/2020 patient presents for 1 week follow-up. She had a culture of her left foot ulcer that grew staph aureus sensitive to Augmentin. This was called in by [place], [place] this morning. She is currently on doxycycline for previous culture result. Patient also had an x-ray done of her left foot that had conflicting results. We asked for clarification and was  told we would have clarification on Monday. Patient states overall she is doing well. She tries to stay off of her foot is much as possible. 12/04/2020 patient presents for 1 week follow-up. She is currently taking Augmentin and tolerating this well. It was confirmed that her x-ray had no acute osseous abnormalities. We switched her to collagen from silver alginate last week. Patient is overall doing well and trying to stay off her foot is much as possible. She has no complaints today. 12/11/2020 upon evaluation today patient appears to be doing well with regard to her wound. She has been tolerating the dressing changes without complication. Fortunately there is no signs of active infection noted at this point. I think she is definitely ready to go back into the cast. 12/18/2020 upon evaluation today patient appears to be doing well with regard to her wound. She has been tolerating the dressing changes without complication. Fortunately there  is no signs of active infection at this time. No fevers, chills, nausea, vomiting, or diarrhea. 12/25/2020 upon evaluation today patient appears to be doing well in general in regard to her plantar foot ulcer. With that being said she tells me currently that there does not appear to be any signs of pain or problems which is great in fact the wound appears to be almost completely closed which is also excellent news. I do not see any signs of infection which is great news as well. No fevers, chills, nausea, vomiting, or diarrhea. 5/25; patient presents for 1 week follow-up. She has had the cast in place for the past week and has tolerated this well. She states she is getting orthotics on 5/10. She currently denies signs of infection and has no issues today. 01/08/2021 upon evaluation today patient appears unfortunately to be present today with issues that she has been having with her plantar foot wound. This apparently has reoccurred unfortunately and is causing quite a bit of discomfort and problems for her. There does not appear to be any signs of active infection which is great news and overall I am very pleased with where things stand in that regard but nonetheless I think she is definitely worried about where this can lead due to past history. She was just taken out of the cast and marked is healed last week. 01/15/2021 upon evaluation today patient unfortunately appears to be doing well but nonetheless continues to have issues with pressure and friction to her foot whenever she is not wearing the cast. Again today I think the wound is significantly improved and almost completely healed in 1 week's time after debridement last week. Nonetheless this has continued to be an issue for that reason we did do a MRI with contrast which really revealed no evidence whatsoever of an issue here. Again this is great but nonetheless still offers up the question what regularly to keep this from continuing to just reopen  every time that we see. Again I really do not know exactly what to do is I feel like this falls on the ground outside of wound care more into a issue more specifically with the orthopedic side of things. I understand the concern that a brace would probably be problematic for the patient. That makes complete sense to be honest. It would probably cause rubbing on other areas which would not be good. With that being said also I am not sure what else we can do other than just keeping her in the cast which is really not a feasible option  either. Every time I take her out of the cast this reopens almost immediately. 01/22/2021 upon evaluation today patient appears to be doing well with regard to her foot ulcer. This is going to require little bit of sharp debridement today. Fortunately there does not appear to be any signs of active infection at this time. No fevers, chills, nausea, vomiting, or diarrhea. I did previously last week tell the patient that I would contact Dr. Nona Dell office I did do so I spoke with Larkin Ina who is the physician assistant for Dr. Doran Durand. Subsequently he did relate to me that they really did not feel like there was anything that could be done differently for the patient. That is from an orthopedic standpoint. He said that the patient could make an appoint with Dr. Doran Durand to discuss further but he was not really sure that would make a big difference in the treatment plan to be honest. Subsequently he further did recommend that potentially the patient after I offered a thought on the potential for a [place] boot could benefit from this since she seems to do well with a total contact cast. With that being said he did advise that [place] would probably be the way to go for that if need be. 01/29/2021 upon evaluation today patient appears to be doing well with regard to her foot ulcer. In fact this pretty much appears to be healed. With that being said of course I am reluctant to absolutely  call it healed and just discharge her due to things we have had going on in the past. I think we still need to monitor and watch out for this for that reason I am going to ensure nothing is hiding or potentially get a reopen honest by bringing her back next week after using some foam over the area today. 02/05/2021 on evaluation today patient's wound actually appears to be completely closed. I do not see anything open on anything even threatened open. With that being said she did have drainage on her dressing it may appear that she had a small blister that occurred, rupture, and has resolved. Overall I do not see anything else concerning at this point. 02/12/2021 upon evaluation today patient actually still continues to maintain closure as far as her wound is concerned this is actually long as she has been without anything reopening. She has been extremely careful with that however. At this point I think she does need to start slowly and carefully increasing her activity with her shoes wants to get checked tomorrow. She has her appointment with Dr. Prudy Feeler next Wednesday and the following Wednesday with Dr. Doran Durand. 03/12/2021 upon evaluation today patient appears to be doing a little worse obviously and the fact that she has been healed for a bit of time and then subsequently reopened in the past week. We got her scheduled for a repeat appointment here to try to get things back under control. Fortunately there does not appear to be any evidence of active infection. Unfortunately I do believe we need to clear away some of the callus and necrotic debris. 03/19/2021 upon evaluation today patient appears to be doing decently well in regard to her foot ulcer to be honest. Fortunately there does not appear to be any signs of active infection which is great news. No fevers, chills, nausea, vomiting, or diarrhea. Overall I am extremely pleased with where things are progressing. She did see the specialist at [facility]  clinic in order to see about new shoes. With that  being said they recommended at this point that the best option would probably be for the patient to continue along with the current shoe although he did make some modifications to it and apparently he thinks that this would likely be quite well for her. I did check out the modifications made and it seems like it is can to keep pressure off decently well in my opinion. Overall I am pretty pleased with her experience there that he was not willing to just go for another 600. Shoes that insurance likely would not pay for when she has appropriately good pair currently that he can modify. Electronic Signature(s) Signed: 03/19/2021 9:04:30 AM By: Worthy Keeler PA-C Entered By: Worthy Keeler on 03/19/2021 09:04:30 -------------------------------------------------------------------------------- Physical Exam Details Patient Name: Date of Service: [name] [name], [name] RA E. 03/19/2021 8:15 A M Medical Record Number: [id number] Patient Account Number: [id number] Date of Birth/Sex: Treating RN: 04-04-1946 (75 y.o. [name] Other Clinician: Referring Provider: Treating Provider/Extender: Landis Martins Weeks in Treatment: 10 Constitutional Well-nourished and well-hydrated in no acute distress. Respiratory normal breathing without difficulty. Psychiatric this patient is able to make decisions and demonstrates good insight into disease process. Alert and Oriented x 3. pleasant and cooperative. Notes Upon inspection patient's wound bed actually showed signs of good granulation epithelization at this point. There does not appear to be any signs of active infection which is great and overall I am extremely pleased with where things stand today. No fevers, chills, nausea, vomiting, or diarrhea. No need for sharp debridement was noted today although we will keep a close eye on things going forward for  certain. Electronic Signature(s) Signed: 03/19/2021 9:04:57 AM By: Worthy Keeler PA-C Entered By: Worthy Keeler on 03/19/2021 09:04:57 -------------------------------------------------------------------------------- Physician Orders Details Patient Name: Date of Service: [name] [name], [name] RA E. 03/19/2021 8:15 A M Medical Record Number: [id number] Patient Account Number: [id number] Date of Birth/Sex: Treating RN: 08/02/1946 (75 y.o. [name] Other Clinician: Referring Provider: Treating Provider/Extender: Adele Schilder in Treatment: 10 Verbal / Phone Orders: No Diagnosis Coding ICD-10 Coding Code Description E11.621 Type 2 diabetes mellitus with foot ulcer L97.522 Non-pressure chronic ulcer of other part of left foot with fat layer exposed M14.672 Charcot's joint, left ankle and foot E11.40 Type 2 diabetes mellitus with diabetic neuropathy, unspecified Follow-up Appointments Return Appointment in 1 week. Bathing/ Shower/ Hygiene May shower and wash wound with soap and water. Off-Loading Other: - custom diabetic shoes Additional Orders / Instructions Follow Nutritious Diet Wound Treatment Wound #[id number] - Foot Wound Laterality: Plantar, Left, Medial Prim Dressing: KerraCel Ag Gelling Fiber Dressing, 2x2 in (silver alginate) 3 x Per Week/7 Days ary Discharge Instructions: Apply silver alginate to wound bed as instructed Secondary Dressing: ComfortFoam Border, 3x3 in (silicone border) 3 x Per Week/7 Days Discharge Instructions: or large bandaide. Apply over primary dressing as directed. Electronic Signature(s) Signed: 03/19/2021 3:55:35 PM By: [name] RN, BSN Signed: 03/20/2021 5:47:57 PM By: Worthy Keeler PA-C Entered By: [name] on 03/19/2021 08:55:56 -------------------------------------------------------------------------------- Problem List Details Patient Name: Date of Service: [name] Guarneri RBA  RA E. 03/19/2021 8:15 A M Medical Record Number: [id number] Patient Account Number: [id number] Date of Birth/Sex: Treating RN: November 19, 1945 (75 y.o. [name] Other Clinician: Referring Provider: Treating Provider/Extender: Adele Schilder in Treatment: 10 Active Problems ICD-10 Encounter Code Description Active  Date MDM Code Description Active Date MDM Diagnosis E11.621 Type 2 diabetes mellitus with foot ulcer 01/08/2021 No Yes L97.522 Non-pressure chronic ulcer of other part of left foot with fat layer exposed 01/08/2021 No Yes M14.672 Charcot's joint, left ankle and foot 01/08/2021 No Yes E11.40 Type 2 diabetes mellitus with diabetic neuropathy, unspecified 01/08/2021 No Yes Inactive Problems Resolved Problems Electronic Signature(s) Signed: 03/19/2021 8:37:43 AM By: Worthy Keeler PA-C Entered By: Worthy Keeler on 03/19/2021 08:37:43 -------------------------------------------------------------------------------- Progress Note Details Patient Name: Date of Service: [name] [name], [name] RA E. 03/19/2021 8:15 A M Medical Record Number: [id number] Patient Account Number: [id number] Date of Birth/Sex: Treating RN: 17-Jun-1946 (75 y.o. [name] Other Clinician: Referring Provider: Treating Provider/Extender: Adele Schilder in Treatment: 10 Subjective Chief Complaint Information obtained from Patient Left foot ulcer History of Present Illness (HPI) 01/17/2020 upon evaluation today patient presents for initial evaluation here in our clinic concerning issues she has been having with a left medial/plantar foot ulcer. This is actually been an issue for her since October 2020. She has been seeing Dr. Doran Durand for quite some time during that course. Fortunately there is no signs of active infection at this time. Or least no mention of this to have seen in general. With that  being said unfortunately I do see some signs of erythema noted today that does have me concerned about the possibility of infection at this point in the surrounding area of the wound. There is also a warm to touch at the site which is somewhat concerning. Fortunately there is no evidence of systemic infection which is great news. The patient does have a history of diabetes mellitus type 2, Charcot foot which is what led to the wound, and hypertension. She notes that she was in a cast for some time with Dr. Doran Durand for about 8 weeks. During that time they were utilizing according to the patient silver nitrate along with a foam doughnut and then Coban to secure in place in the cast in place. With that being said I do not have the actual records to review we are going to try to get a hold of those unfortunately they would not flow over into care everywhere I did look today. She has been seeing Dr. Doran Durand and his physician assistant Larkin Ina up until the end of May and apparently is still seeing them on a regular basis every 2 weeks roughly. She has also tried Iodosorb without effect here. 01/24/2020 upon evaluation today patient actually appears to be doing quite well with regard to her wounds. She has been tolerating the dressing changes without complication. Fortunately there is no signs of active infection spreading which is good news. Her culture did show signs of Staph aureus I did place her on Augmentin due to the erythema surrounding the wound. With that being said the wound does appear to be doing better she has her longer walking cast/boot and I think that is actually good for her for the time being. I am considering reinitiating total contact cast when she gets back from vacation but next week she will actually be out of town at [employer] she knows not to get in the water but she still obviously is planning to enjoy herself she is going to take it easy on her foot however. 02/07/2020 upon evaluation  today patient appears to be doing fairly well in regard to her ulcer on her foot. Fortunately there is no signs  of severe infection at this time which is great news and overall very pleased in that regard. With that being said I do think that she could still benefit from a total contact cast. Nonetheless she is using her walking boot which at least provide some protection and that it prevents some of the friction occurring when she is ambulating. 02/14/2020 upon evaluation today patient appears to be doing well with regard to her foot ulcer. This is actually measuring a little bit smaller yet again this week. Overall very pleased with where things stand and I do not see any signs of active infection at this time which is also good news. Since she is measuring better the patient has wanting to somewhat hold off on proceeding with the total contact cast which I think is reasonable at this point. 02/28/2020 on evaluation today patient appears to be doing well in general in regard to her wound although she has a lot of callus buildup as compared to last time I saw her. This is can require sharp debridement today. I do believe she really needs the total contact cast as well which we have discussed previous. 7/23; patient comes in for a total contact cast change 03/06/2020 on evaluation today patient appears to be doing quite well with regard to her wounds. Fortunately the wound bed is measuring smaller and looking much better there is little callus noted although there is some debridement necessary today. 03/13/2020 on evaluation today patient's wound actually appears to be doing excellent which is great news. With that being said unfortunately she is having some issues currently with her left leg where she does have cellulitis it appears. This may have come from an area that rubbed underneath the cast from last week that we noted we padded that area and it looks to be doing excellent at this point but nonetheless  the leg was somewhat painful, swollen, and somewhat erythematous. She also had an elevated white blood cell count of 11.5 based on what I saw on looking at her records from the med center in [facility] from where she was seen yesterday. Unfortunately with [place] having a provider on vacation there was no one here in the clinic in the afternoon when she called therefore she went to the ER as advised. Subsequently they did not cut off the cast as they did not have anyone from orthopedics there to do so and subsequently also did not have the ability to do the Doppler for evaluation of DVT They recommended therefore given her dose of Eliquis as well as . Augmentin and sent her home to come see [place] today to have the cast taken off and then she is supposed to go back to have the study for DVT performed they are following. 03/20/2020 upon evaluation today patient appears to be doing well with regard to her foot all things considered we have not been able to use the total contact cast due to the infection that she had last week. She has been on the doxycycline and she had a 10-day supply of that I do believe that is helping and her leg appears to be doing better. With that being said there is fortunately no signs of active infection systemically at this time which is good news. No fevers, chills, nausea, vomiting, or diarrhea. 03/27/2020 upon evaluation today patient appears to be doing well with regard to her foot ulcer. There does not appear to be signs of active infection which is great news. Overall I  am very pleased with where things stand at this point. 04/03/2020 upon evaluation today patient appears to be doing pretty well in regard to the overall appearance of her wound. Fortunately there is no signs of active infection at this time which is great news. No fevers, chills, nausea, vomiting, or diarrhea. With that being said she does have some blue-green drainage that actually is a little bit concerning to me  for the possibility of Pseudomonas. I discussed that with the patient today. With that being said I do believe that we may be able to manage this however with the topical antibiotic cream as opposed to having to do anything oral especially since she seems to be doing so well with overall appearance of the wound. 04/10/2020 on evaluation today patient appears to be doing about the same roughly in regard to the overall size of her wound. With that being said she fortunately has not shown any signs of worsening overall which is good news. I do believe that she is doing a great job trying to offload but again she may still do better with the cast. I do not see in the blue-green drainage that we noticed previously I do believe the gentamicin help in this regard. 04/17/2020 on evaluation today patient's wound appears to be doing about the same at this point. There is no significant improvement at this point. No fever chills noted. She is up for put the cast back on the day. That she states in a couple weeks she will need to have this off to go to a workshop. 04/24/2020 on evaluation today patient appears to be doing significantly better in regard to her wound. Fortunately there is no signs of active infection and overall feel like she is making great progress the cast seems to have done excellent for her. 05/01/2020 upon evaluation today patient presents for reevaluation she really does not appear to be doing too badly in regard to the actual wound on the left foot we have been managing. Unfortunately she has bilateral lower extremity edema with blisters between the webspace of her first and second toe on both feet. She has a tremendous amount of edema in the legs which I think is where this is coming from it does not appear to be infected but nonetheless I do believe this is can be something that needs to be addressed today. Obviously this means we probably will not be putting the cast on at this point.  She attributes this to the fact that she was sitting with her feet on the floor much longer during a conference last week she had a great time but unfortunately had a lot of complications as a result. 05/08/2020 upon evaluation today patient appears to be doing somewhat better in regard to her wounds at this time. Fortunately there is no signs of active infection which is great news. With that being said I do believe that the blisters have ruptured and unfortunately did not just reattach I will remove some of the blistered tissue today. With that being said I do think the wound itself on the plantar aspect of left foot does need to have sharp debridement. 05/15/2020 upon evaluation today patient appears to be doing about the same in regard to her foot ulcer. Unfortunately in the past week her husband had a fall where he sustained a mild traumatic brain bleed. Fortunately he is doing better but being that he was in the hospital she had a walk on this a lot more.  The wound does not appear to be any better is also not really appearing to be significantly worse which is good news. There is no signs of active infection at this time. 10/14; patient with a small diabetic wound on the medial part of her left foot. We have been using silver collagen a total contact cast making good progress. I think the patient had a series of blisters on her dorsal foot probably secondary to having her legs recumbent for 3 days while in a conference in [place]. We wrapped her leg last week these are all healed. We did not previously have her in compression on the right leg. 05/29/2020 upon evaluation today patient appears to be doing well with regard to the wound on the plantar aspect of her foot medially. This is measuring smaller and looking much better than last time I saw her. Again when I did see her last was 2 weeks back and the wound was significantly larger. I do believe the cast is helping and I believe the collagen is  a good option for her. 06/05/2020 on evaluation today patient appears to be doing well with regard to her foot ulcer this is actually measuring significantly better and overall I feel like she is doing excellent. There is no signs of active infection at this time. 06/12/2020 upon evaluation today patient actually continues to show signs of good improvement which is excellent news. I am extremely pleased with how she seems to be progressing at this point in regard to her wound. There is still some depth to the wound but I do believe the collagen is helping her quite a bit. 06/19/2020 upon evaluation today patient appears to be doing well with regard to her plantar foot ulcer. She is actually making excellent progress and in fact this appears to be almost completely healed. With that being said I do believe that the patient is going to actually require 1 more week in the cast although after that I am hopeful she will be ready for discharge. 06/26/2020 on evaluation today patient appears to be doing well in regard to her wound currently. Fortunately there is no signs of active infection in general I feel like she is doing excellent. This appears to be completely healed I think she is ready to come out of the cast. 11/29; patient comes back in the clinic today with a very quick reopening in the exact same area on the medial plantar foot. She had been healed out last time. She went back into some new balance shoes that she got at [employer] with a custom insert. As it turns out this wound also happened when wearing these shoes although there was some modification made I think with the wound initially happened they added some foam around the wound area. This obviously is not going to be sufficient. 07/17/2020 on evaluation today patient appears to be doing well with regard to her wound. Fortunately she seems to be making good progress. Unfortunately she was in the hospital due to a issue with colitis and had  just been discharged today in fact. She tells me that her biggest concern here is that a lot of her numbers especially her creatinine were somewhat elevated and problematic. She is can be following up with her provider in order to have a further work-up at this time. With that being said she did need to come back in for a cast change. She did not allow them to remove the cast due to the fact that  she did not feel like that was the issue whatsoever and indeed it does not appear that was the case her wound appears to be doing excellent today. 07/24/2020 upon evaluation today patient appears to be doing well in regard to her foot ulcer. She still has a small opening but this is showing signs of excellent improvement overall but they were very close to complete resolution. No fevers, chills, nausea, vomiting, or diarrhea. 07/31/2020 upon evaluation today patient actually appears to be doing excellent she is actually completely healed this is great news. Fortunately there is no signs of active infection at this time. No fevers, chills, nausea, vomiting, or diarrhea. The patient tells me that she did see Dr. Paulla Dolly in order to get to [place] so that she can have a custom shoe made. She saw him earlier last week. She does have an appointment with Liliane Channel on the sixth I believe on January she tells me Readmission: 08/28/2020 on evaluation today patient appears to be doing poorly in regard to her wound currently. She tells me this has reopened. The good news that she did get measured for and actually her shoes are on the way in from [facility].. With that being said she has been trying to stay off of this is much as possible using her wheelchair around home. Nonetheless has been somewhat difficult. The good news is her shoes should be here next week 09/04/2020 upon evaluation today patient appears to be doing well with regard to her wound. There is a little bit of callus buildup but nothing too significant  she does tell me she was very active this week. Fortunately there is no signs of systemic infection at this point. The dorsal foot wound actually appears to be doing better. I think this is healed. 09/11/2020 upon evaluation today patient appears to actually be doing quite well in my opinion based on what I am seeing today. Fortunately there is no signs of infection in fact her mother is certain that this is even open any longer based on what I see. Fortunately I think the patient has been doing everything she can to try to keep this under control. 09/18/2020 upon evaluation today patient appears to be doing about the same in regard to her foot ulcer. She has been tolerating the dressing changes without complication. Fortunately there is no sign of active infection at this time. No fevers, chills, nausea, vomiting, or diarrhea. She is going require some sharp debridement today. 09/25/2020 upon evaluation today patient appears to be doing well with regard to his wounds she has been tolerating the dressing changes without complication. Her wound appears to be completely healed based on what I am seeing at this point. There does not appear to be any signs of active infection at this time which is great news. No fevers, chills, nausea, vomiting, or diarrhea. She did get the cushion for her foot as well that she ordered to try to help keep pressure off of this area. That looks like it may be very beneficial for her to be honest. 10/02/2020 upon evaluation today patient appears to be doing more poorly in regard to her foot ulcer. Unfortunately this has reopened since we saw her last week. It apparently did not take too long at all for this to happen. She is obviously somewhat disappointed as she was hopeful that that would be time this thing would stay closed. Fortunately there is no evidence of active infection at this point. No fevers, chills, nausea,  vomiting, or diarrhea. With that being said I am good have  to perform a little bit of sharp debridement clear away some of the debris currently. Including a minimal amount of callus at this time. 10/16/2020 upon evaluation today patient appears to be doing about the same in regard to her foot ulcer unfortunately. There does not appear to be any signs of active infection which is great news. No fever chills noted she has been tolerating the dressing changes without complication which is great news. Unfortunately she tells me that she has been very depressed about the situation is getting very frustrating to her that she continues to have issues despite everything that she is trying to do to stay off of her foot. She is extremely discouraged and I do hate to hear this. Obviously we have been hopeful that if she got her shoes that would make a difference she is actually can be getting those tomorrow but I am not certain that that alone is good to be enough to get this to heal. We may end up having to consider going back into a total contact cast to get this to heal and then subsequently once we get her healed get her into her new diabetic shoes which will and my hope anyway keep this from reopening again. 10/23/2020 upon evaluation today patient appears to be doing a little worse both in regard to the size of her wound as well as in regard to the fact that she has erythema surrounding the wound and wrap around the lateral part of her foot where she tells me has been somewhat sore. Fortunately there does not appear to be any evidence of infection systemically but locally definitely there is erythema and warmth consistent with local cellulitis. I am glad to remove some of the callus as well. 10/30/2020 upon evaluation today patient appears to be doing well with regard to her wound on the plantar aspect of her foot. This is significantly improved compared to last week. 10/30/2020 upon evaluation today patient actually is making excellent progress her wound appears to  show signs of great improvement which is wonderful and that she is extremely pleased to hear this. She actually leaves Sunday to go on her trip with her half sister and friends. 11/13/2020 on evaluation today patient's wound actually appears to be doing quite well. There does not appear to be any signs of infection it has been 2 weeks since have seen her she does have a little bit of callus buildup here today but at the same time I do not believe the vacation time set her back any just has not really made a lot of significant improvement. She is done with her antibiotics at this point. 11/20/2020 upon evaluation today patient appears to be doing poorly in regard to her foot. In fact this appears to be showing signs of Infection. She has erythema and warmth that is concerning. I know she is very discouraged as this seems to be a recurrent issue. I think we may need to delve further into the possibility of something deeper going on here as far as a structural infection. 11/27/2020 patient presents for 1 week follow-up. She had a culture of her left foot ulcer that grew staph aureus sensitive to Augmentin. This was called in by [place], [place] this morning. She is currently on doxycycline for previous culture result. Patient also had an x-ray done of her left foot that had conflicting results. We asked for clarification and was told we  would have clarification on Monday. Patient states overall she is doing well. She tries to stay off of her foot is much as possible. 12/04/2020 patient presents for 1 week follow-up. She is currently taking Augmentin and tolerating this well. It was confirmed that her x-ray had no acute osseous abnormalities. We switched her to collagen from silver alginate last week. Patient is overall doing well and trying to stay off her foot is much as possible. She has no complaints today. 12/11/2020 upon evaluation today patient appears to be doing well with regard to her wound. She has been  tolerating the dressing changes without complication. Fortunately there is no signs of active infection noted at this point. I think she is definitely ready to go back into the cast. 12/18/2020 upon evaluation today patient appears to be doing well with regard to her wound. She has been tolerating the dressing changes without complication. Fortunately there is no signs of active infection at this time. No fevers, chills, nausea, vomiting, or diarrhea. 12/25/2020 upon evaluation today patient appears to be doing well in general in regard to her plantar foot ulcer. With that being said she tells me currently that there does not appear to be any signs of pain or problems which is great in fact the wound appears to be almost completely closed which is also excellent news. I do not see any signs of infection which is great news as well. No fevers, chills, nausea, vomiting, or diarrhea. 5/25; patient presents for 1 week follow-up. She has had the cast in place for the past week and has tolerated this well. She states she is getting orthotics on 5/10. She currently denies signs of infection and has no issues today. 01/08/2021 upon evaluation today patient appears unfortunately to be present today with issues that she has been having with her plantar foot wound. This apparently has reoccurred unfortunately and is causing quite a bit of discomfort and problems for her. There does not appear to be any signs of active infection which is great news and overall I am very pleased with where things stand in that regard but nonetheless I think she is definitely worried about where this can lead due to past history. She was just taken out of the cast and marked is healed last week. 01/15/2021 upon evaluation today patient unfortunately appears to be doing well but nonetheless continues to have issues with pressure and friction to her foot whenever she is not wearing the cast. Again today I think the wound is significantly  improved and almost completely healed in 1 week's time after debridement last week. Nonetheless this has continued to be an issue for that reason we did do a MRI with contrast which really revealed no evidence whatsoever of an issue here. Again this is great but nonetheless still offers up the question what regularly to keep this from continuing to just reopen every time that we see. Again I really do not know exactly what to do is I feel like this falls on the ground outside of wound care more into a issue more specifically with the orthopedic side of things. I understand the concern that a brace would probably be problematic for the patient. That makes complete sense to be honest. It would probably cause rubbing on other areas which would not be good. With that being said also I am not sure what else we can do other than just keeping her in the cast which is really not a feasible option either. Every  time I take her out of the cast this reopens almost immediately. 01/22/2021 upon evaluation today patient appears to be doing well with regard to her foot ulcer. This is going to require little bit of sharp debridement today. Fortunately there does not appear to be any signs of active infection at this time. No fevers, chills, nausea, vomiting, or diarrhea. I did previously last week tell the patient that I would contact Dr. Nona Dell office I did do so I spoke with Larkin Ina who is the physician assistant for Dr. Doran Durand. Subsequently he did relate to me that they really did not feel like there was anything that could be done differently for the patient. That is from an orthopedic standpoint. He said that the patient could make an appoint with Dr. Doran Durand to discuss further but he was not really sure that would make a big difference in the treatment plan to be honest. Subsequently he further did recommend that potentially the patient after I offered a thought on the potential for a Crow boot could benefit from  this since she seems to do well with a total contact cast. With that being said he did advise that [place] would probably be the way to go for that if need be. 01/29/2021 upon evaluation today patient appears to be doing well with regard to her foot ulcer. In fact this pretty much appears to be healed. With that being said of course I am reluctant to absolutely call it healed and just discharge her due to things we have had going on in the past. I think we still need to monitor and watch out for this for that reason I am going to ensure nothing is hiding or potentially get a reopen honest by bringing her back next week after using some foam over the area today. 02/05/2021 on evaluation today patient's wound actually appears to be completely closed. I do not see anything open on anything even threatened open. With that being said she did have drainage on her dressing it may appear that she had a small blister that occurred, rupture, and has resolved. Overall I do not see anything else concerning at this point. 02/12/2021 upon evaluation today patient actually still continues to maintain closure as far as her wound is concerned this is actually long as she has been without anything reopening. She has been extremely careful with that however. At this point I think she does need to start slowly and carefully increasing her activity with her shoes wants to get checked tomorrow. She has her appointment with Dr. Prudy Feeler next Wednesday and the following Wednesday with Dr. Doran Durand. 03/12/2021 upon evaluation today patient appears to be doing a little worse obviously and the fact that she has been healed for a bit of time and then subsequently reopened in the past week. We got her scheduled for a repeat appointment here to try to get things back under control. Fortunately there does not appear to be any evidence of active infection. Unfortunately I do believe we need to clear away some of the callus and necrotic  debris. 03/19/2021 upon evaluation today patient appears to be doing decently well in regard to her foot ulcer to be honest. Fortunately there does not appear to be any signs of active infection which is great news. No fevers, chills, nausea, vomiting, or diarrhea. Overall I am extremely pleased with where things are progressing. She did see the specialist at [facility] clinic in order to see about new shoes. With that being said  they recommended at this point that the best option would probably be for the patient to continue along with the current shoe although he did make some modifications to it and apparently he thinks that this would likely be quite well for her. I did check out the modifications made and it seems like it is can to keep pressure off decently well in my opinion. Overall I am pretty pleased with her experience there that he was not willing to just go for another 600. Shoes that insurance likely would not pay for when she has appropriately good pair currently that he can modify. Objective Constitutional Well-nourished and well-hydrated in no acute distress. Vitals Time Taken: 8:20 AM, Temperature: 98.6 F, Pulse: 79 bpm, Respiratory Rate: 18 breaths/min, Blood Pressure: 137/79 mmHg. Respiratory normal breathing without difficulty. Psychiatric this patient is able to make decisions and demonstrates good insight into disease process. Alert and Oriented x 3. pleasant and cooperative. General Notes: Upon inspection patient's wound bed actually showed signs of good granulation epithelization at this point. There does not appear to be any signs of active infection which is great and overall I am extremely pleased with where things stand today. No fevers, chills, nausea, vomiting, or diarrhea. No need for sharp debridement was noted today although we will keep a close eye on things going forward for certain. Integumentary (Hair, Skin) Wound #1RRRR status is Open. Original cause of wound  was Gradually Appeared. The date acquired was: 05/11/2019. The wound has been in treatment 61 weeks. The wound is located on the [place]. The wound measures 0.3cm length x 0.2cm width x 0.2cm depth; 0.047cm^2 area and 0.009cm^3 volume. There is Fat Layer (Subcutaneous Tissue) exposed. There is no tunneling or undermining noted. There is a medium amount of serosanguineous drainage noted. The wound margin is distinct with the outline attached to the wound base. There is large (67-100%) pink, pale granulation within the wound bed. There is no necrotic tissue within the wound bed. Assessment Active Problems ICD-10 Type 2 diabetes mellitus with foot ulcer Non-pressure chronic ulcer of other part of left foot with fat layer exposed Charcot's joint, left ankle and foot Type 2 diabetes mellitus with diabetic neuropathy, unspecified Plan Follow-up Appointments: Return Appointment in 1 week. Bathing/ Shower/ Hygiene: May shower and wash wound with soap and water. Off-Loading: Other: - custom diabetic shoes Additional Orders / Instructions: Follow Nutritious Diet WOUND #1RRRR: - Foot Wound Laterality: Plantar, Left, Medial Prim Dressing: KerraCel Ag Gelling Fiber Dressing, 2x2 in (silver alginate) 3 x Per Week/7 Days ary Discharge Instructions: Apply silver alginate to wound bed as instructed Secondary Dressing: ComfortFoam Border, 3x3 in (silicone border) 3 x Per Week/7 Days Discharge Instructions: or large bandaide. Apply over primary dressing as directed. 1. Would recommend currently that we going to continue with the wound care measures as before and the patient is in agreement with the plan. This includes the use of the silver alginate dressing which I think is doing a good job at keeping things under control as far as drainage is concerned. 2. I am also can recommend at this time that we have the patient continue with just a Band-Aid cover which I think is okay and will do  well with the alginate as far as keeping the area protected while in her diabetic shoes she should be doing well as far as preventing the offloading as well. We will see patient back for reevaluation in 1 week here in the clinic. If anything worsens or  changes patient will contact our office for additional recommendations. We will consider repeating the total contact cast in the future as needed but right now I think that that is probably not necessary especially as well as the shoe seems to be from the standpoint of offloading. Electronic Signature(s) Signed: 03/19/2021 9:06:37 AM By: Worthy Keeler PA-C Entered By: Worthy Keeler on 03/19/2021 09:06:37 -------------------------------------------------------------------------------- SuperBill Details Patient Name: Date of Service: [name] [name], [name] RA E. 03/19/2021 Medical Record Number: [id number] Patient Account Number: [id number] Date of Birth/Sex: Treating RN: 1945/08/14 (75 y.o. [name] Other Clinician: Referring Provider: Treating Provider/Extender: Landis Martins Weeks in Treatment: 10 Diagnosis Coding ICD-10 Codes Code Description E11.621 Type 2 diabetes mellitus with foot ulcer L97.522 Non-pressure chronic ulcer of other part of left foot with fat layer exposed M14.672 Charcot's joint, left ankle and foot E11.40 Type 2 diabetes mellitus with diabetic neuropathy, unspecified Facility Procedures CPT4 Code: [id number] Description: 99213 - WOUND CARE VISIT-LEV 3 EST PT Modifier: Quantity: 1 Physician Procedures : CPT4 Code Description Modifier [id number] - WC PHYS LEVEL 4 - EST PT ICD-10 Diagnosis Description E11.621 Type 2 diabetes mellitus with foot ulcer L97.522 Non-pressure chronic ulcer of other part of left foot with fat layer exposed M14.672 Charcot's  joint, left ankle and foot E11.40 Type 2 diabetes mellitus with diabetic neuropathy, unspecified Quantity:  1 Electronic Signature(s) Signed: 03/19/2021 9:06:54 AM By: Worthy Keeler PA-C Entered By: Worthy Keeler on 03/19/2021 09:06:54

## 2021-03-21 DIAGNOSIS — H903 Sensorineural hearing loss, bilateral: Secondary | ICD-10-CM | POA: Diagnosis not present

## 2021-03-26 ENCOUNTER — Encounter (HOSPITAL_BASED_OUTPATIENT_CLINIC_OR_DEPARTMENT_OTHER): Payer: Medicare Other | Admitting: Physician Assistant

## 2021-03-26 ENCOUNTER — Other Ambulatory Visit: Payer: Self-pay

## 2021-03-26 DIAGNOSIS — E1140 Type 2 diabetes mellitus with diabetic neuropathy, unspecified: Secondary | ICD-10-CM | POA: Diagnosis not present

## 2021-03-26 DIAGNOSIS — L97522 Non-pressure chronic ulcer of other part of left foot with fat layer exposed: Secondary | ICD-10-CM | POA: Diagnosis not present

## 2021-03-26 DIAGNOSIS — E1151 Type 2 diabetes mellitus with diabetic peripheral angiopathy without gangrene: Secondary | ICD-10-CM | POA: Diagnosis not present

## 2021-03-26 DIAGNOSIS — E11621 Type 2 diabetes mellitus with foot ulcer: Secondary | ICD-10-CM | POA: Diagnosis not present

## 2021-03-26 NOTE — Progress Notes (Signed)
[name] [name], [name] [name] ([id number]) Visit Report for 03/26/2021 Chief Complaint Document Details Patient Name: Date of Service: [name], [name] 03/26/2021 3:30 PM Medical Record Number: [id number] Patient Account Number: [id number] Date of Birth/Sex: Treating RN: 1946-02-09 (75 y.o. F) Primary Care Provider: [name] Other Clinician: Referring Provider: Treating Provider/Extender: Adele Schilder in Treatment: 11 Information Obtained from: Patient Chief Complaint Left foot ulcer Electronic Signature(s) Signed: 03/26/2021 4:02:03 PM By: Worthy Keeler PA-C Entered By: Worthy Keeler on 03/26/2021 16:02:03 -------------------------------------------------------------------------------- Debridement Details Patient Name: Date of Service: [name] [name] 03/26/2021 3:30 PM Medical Record Number: [id number] Patient Account Number: [id number] Date of Birth/Sex: Treating RN: 1946/06/21 (75 y.o. [name] [name], [name] [name] Primary Care Provider: [name] Other Clinician: Referring Provider: Treating Provider/Extender: Adele Schilder in Treatment: 11 Debridement Performed for Assessment: Wound #[id number] Left,Medial,Plantar Foot Performed By: Physician Worthy Keeler, PA Debridement Type: Debridement Severity of Tissue Pre Debridement: Fat layer exposed Level of Consciousness (Pre-procedure): Awake and Alert Pre-procedure Verification/Time Out Yes - 16:05 Taken: Start Time: 16:05 Pain Control: Lidocaine T Area Debrided (L x W): otal 0.2 (cm) x 0.3 (cm) = 0.06 (cm) Tissue and other material debrided: Viable, Non-Viable, Subcutaneous, Skin: Dermis , Skin: Epidermis Level: Skin/Subcutaneous Tissue Debridement Description: Excisional Instrument: Curette Bleeding: Moderate Hemostasis Achieved: Pressure End Time: 16:05 Procedural Pain: 0 Post Procedural Pain: 0 Response to Treatment: Procedure was tolerated well Level of Consciousness (Post- Awake  and Alert procedure): Post Debridement Measurements of Total Wound Length: (cm) 0.2 Width: (cm) 0.3 Depth: (cm) 0.2 Volume: (cm) 0.009 Character of Wound/Ulcer Post Debridement: Improved Severity of Tissue Post Debridement: Fat layer exposed Post Procedure Diagnosis Same as Pre-procedure Electronic Signature(s) Signed: 03/26/2021 4:56:53 PM By: Worthy Keeler PA-C Signed: 03/26/2021 5:20:51 PM By: [name] Hammock RN Entered By: [name] [name] on 03/26/2021 16:34:25 -------------------------------------------------------------------------------- HPI Details Patient Name: Date of Service: [name] [name], [name] RA E. 03/26/2021 3:30 PM Medical Record Number: [id number] Patient Account Number: [id number] Date of Birth/Sex: Treating RN: 05-14-1946 (75 y.o. F) Primary Care Provider: [name] Other Clinician: Referring Provider: Treating Provider/Extender: Adele Schilder in Treatment: 11 History of Present Illness HPI Description: 01/17/2020 upon evaluation today patient presents for initial evaluation here in our clinic concerning issues she has been having with a left medial/plantar foot ulcer. This is actually been an issue for her since October 2020. She has been seeing Dr. Doran Durand for quite some time during that course. Fortunately there is no signs of active infection at this time. Or least no mention of this to have seen in general. With that being said unfortunately I do see some signs of erythema noted today that does have me concerned about the possibility of infection at this point in the surrounding area of the wound. There is also a warm to touch at the site which is somewhat concerning. Fortunately there is no evidence of systemic infection which is great news. The patient does have a history of diabetes mellitus type 2, Charcot foot which is what led to the wound, and hypertension. She notes that she was in a cast for some time with Dr. Doran Durand for about 8  weeks. During that time they were utilizing according to the patient silver nitrate along with a foam doughnut and then Coban to secure in place in the cast in place. With that being said I do not have the actual records to review we are going to try  to get a hold of those unfortunately they would not flow over into care everywhere I did look today. She has been seeing Dr. Doran Durand and his physician assistant Larkin Ina up until the end of May and apparently is still seeing them on a regular basis every 2 weeks roughly. She has also tried Iodosorb without effect here. 01/24/2020 upon evaluation today patient actually appears to be doing quite well with regard to her wounds. She has been tolerating the dressing changes without complication. Fortunately there is no signs of active infection spreading which is good news. Her culture did show signs of Staph aureus I did place her on Augmentin due to the erythema surrounding the wound. With that being said the wound does appear to be doing better she has her longer walking cast/boot and I think that is actually good for her for the time being. I am considering reinitiating total contact cast when she gets back from vacation but next week she will actually be out of town at [employer] she knows not to get in the water but she still obviously is planning to enjoy herself she is going to take it easy on her foot however. 02/07/2020 upon evaluation today patient appears to be doing fairly well in regard to her ulcer on her foot. Fortunately there is no signs of severe infection at this time which is great news and overall very pleased in that regard. With that being said I do think that she could still benefit from a total contact cast. Nonetheless she is using her walking boot which at least provide some protection and that it prevents some of the friction occurring when she is ambulating. 02/14/2020 upon evaluation today patient appears to be doing well with regard to  her foot ulcer. This is actually measuring a little bit smaller yet again this week. Overall very pleased with where things stand and I do not see any signs of active infection at this time which is also good news. Since she is measuring better the patient has wanting to somewhat hold off on proceeding with the total contact cast which I think is reasonable at this point. 02/28/2020 on evaluation today patient appears to be doing well in general in regard to her wound although she has a lot of callus buildup as compared to last time I saw her. This is can require sharp debridement today. I do believe she really needs the total contact cast as well which we have discussed previous. 7/23; patient comes in for a total contact cast change 03/06/2020 on evaluation today patient appears to be doing quite well with regard to her wounds. Fortunately the wound bed is measuring smaller and looking much better there is little callus noted although there is some debridement necessary today. 03/13/2020 on evaluation today patient's wound actually appears to be doing excellent which is great news. With that being said unfortunately she is having some issues currently with her left leg where she does have cellulitis it appears. This may have come from an area that rubbed underneath the cast from last week that we noted we padded that area and it looks to be doing excellent at this point but nonetheless the leg was somewhat painful, swollen, and somewhat erythematous. She also had an elevated white blood cell count of 11.5 based on what I saw on looking at her records from the med center in [facility] [place] from where she was seen yesterday. Unfortunately with [place] having a provider on vacation there was  no one here in the clinic in the afternoon when she called therefore she went to the ER as advised. Subsequently they did not cut off the cast as they did not have anyone from orthopedics there to do so and subsequently also did  not have the ability to do the Doppler for evaluation of DVT They recommended therefore given her dose of Eliquis as well as . Augmentin and sent her home to come see [place] today to have the cast taken off and then she is supposed to go back to have the study for DVT performed they are following. 03/20/2020 upon evaluation today patient appears to be doing well with regard to her foot all things considered we have not been able to use the total contact cast due to the infection that she had last week. She has been on the doxycycline and she had a 10-day supply of that I do believe that is helping and her leg appears to be doing better. With that being said there is fortunately no signs of active infection systemically at this time which is good news. No fevers, chills, nausea, vomiting, or diarrhea. 03/27/2020 upon evaluation today patient appears to be doing well with regard to her foot ulcer. There does not appear to be signs of active infection which is great news. Overall I am very pleased with where things stand at this point. 04/03/2020 upon evaluation today patient appears to be doing pretty well in regard to the overall appearance of her wound. Fortunately there is no signs of active infection at this time which is great news. No fevers, chills, nausea, vomiting, or diarrhea. With that being said she does have some blue-green drainage that actually is a little bit concerning to me for the possibility of Pseudomonas. I discussed that with the patient today. With that being said I do believe that we may be able to manage this however with the topical antibiotic cream as opposed to having to do anything oral especially since she seems to be doing so well with overall appearance of the wound. 04/10/2020 on evaluation today patient appears to be doing about the same roughly in regard to the overall size of her wound. With that being said she fortunately has not shown any signs of worsening overall which  is good news. I do believe that she is doing a great job trying to offload but again she may still do better with the cast. I do not see in the blue-green drainage that we noticed previously I do believe the gentamicin help in this regard. 04/17/2020 on evaluation today patient's wound appears to be doing about the same at this point. There is no significant improvement at this point. No fever chills noted. She is up for put the cast back on the day. That she states in a couple weeks she will need to have this off to go to a workshop. 04/24/2020 on evaluation today patient appears to be doing significantly better in regard to her wound. Fortunately there is no signs of active infection and overall feel like she is making great progress the cast seems to have done excellent for her. 05/01/2020 upon evaluation today patient presents for reevaluation she really does not appear to be doing too badly in regard to the actual wound on the left foot we have been managing. Unfortunately she has bilateral lower extremity edema with blisters between the webspace of her first and second toe on both feet. She has a tremendous amount of edema  in the legs which I think is where this is coming from it does not appear to be infected but nonetheless I do believe this is can be something that needs to be addressed today. Obviously this means we probably will not be putting the cast on at this point. She attributes this to the fact that she was sitting with her feet on the floor much longer during a conference last week she had a great time but unfortunately had a lot of complications as a result. 05/08/2020 upon evaluation today patient appears to be doing somewhat better in regard to her wounds at this time. Fortunately there is no signs of active infection which is great news. With that being said I do believe that the blisters have ruptured and unfortunately did not just reattach I will remove some of the blistered tissue  today. With that being said I do think the wound itself on the plantar aspect of left foot does need to have sharp debridement. 05/15/2020 upon evaluation today patient appears to be doing about the same in regard to her foot ulcer. Unfortunately in the past week her husband had a fall where he sustained a mild traumatic brain bleed. Fortunately he is doing better but being that he was in the hospital she had a walk on this a lot more. The wound does not appear to be any better is also not really appearing to be significantly worse which is good news. There is no signs of active infection at this time. 10/14; patient with a small diabetic wound on the medial part of her left foot. We have been using silver collagen a total contact cast making good progress. I think the patient had a series of blisters on her dorsal foot probably secondary to having her legs recumbent for 3 days while in a conference in [place]. We wrapped her leg last week these are all healed. We did not previously have her in compression on the right leg. 05/29/2020 upon evaluation today patient appears to be doing well with regard to the wound on the plantar aspect of her foot medially. This is measuring smaller and looking much better than last time I saw her. Again when I did see her last was 2 weeks back and the wound was significantly larger. I do believe the cast is helping and I believe the collagen is a good option for her. 06/05/2020 on evaluation today patient appears to be doing well with regard to her foot ulcer this is actually measuring significantly better and overall I feel like she is doing excellent. There is no signs of active infection at this time. 06/12/2020 upon evaluation today patient actually continues to show signs of good improvement which is excellent news. I am extremely pleased with how she seems to be progressing at this point in regard to her wound. There is still some depth to the wound but I do  believe the collagen is helping her quite a bit. 06/19/2020 upon evaluation today patient appears to be doing well with regard to her plantar foot ulcer. She is actually making excellent progress and in fact this appears to be almost completely healed. With that being said I do believe that the patient is going to actually require 1 more week in the cast although after that I am hopeful she will be ready for discharge. 06/26/2020 on evaluation today patient appears to be doing well in regard to her wound currently. Fortunately there is no signs of active infection in  general I feel like she is doing excellent. This appears to be completely healed I think she is ready to come out of the cast. 11/29; patient comes back in the clinic today with a very quick reopening in the exact same area on the medial plantar foot. She had been healed out last time. She went back into some new balance shoes that she got at [employer] with a custom insert. As it turns out this wound also happened when wearing these shoes although there was some modification made I think with the wound initially happened they added some foam around the wound area. This obviously is not going to be sufficient. 07/17/2020 on evaluation today patient appears to be doing well with regard to her wound. Fortunately she seems to be making good progress. Unfortunately she was in the hospital due to a issue with colitis and had just been discharged today in fact. She tells me that her biggest concern here is that a lot of her numbers especially her creatinine were somewhat elevated and problematic. She is can be following up with her provider in order to have a further work-up at this time. With that being said she did need to come back in for a cast change. She did not allow them to remove the cast due to the fact that she did not feel like that was the issue whatsoever and indeed it does not appear that was the case her wound appears to be doing  excellent today. 07/24/2020 upon evaluation today patient appears to be doing well in regard to her foot ulcer. She still has a small opening but this is showing signs of excellent improvement overall but they were very close to complete resolution. No fevers, chills, nausea, vomiting, or diarrhea. 07/31/2020 upon evaluation today patient actually appears to be doing excellent she is actually completely healed this is great news. Fortunately there is no signs of active infection at this time. No fevers, chills, nausea, vomiting, or diarrhea. The patient tells me that she did see Dr. Paulla Dolly in order to get to [place] so that she can have a custom shoe made. She saw him earlier last week. She does have an appointment with Liliane Channel on the sixth I believe on January she tells me Readmission: 08/28/2020 on evaluation today patient appears to be doing poorly in regard to her wound currently. She tells me this has reopened. The good news that she did get measured for and actually her shoes are on the way in from [facility].. With that being said she has been trying to stay off of this is much as possible using her wheelchair around home. Nonetheless has been somewhat difficult. The good news is her shoes should be here next week 09/04/2020 upon evaluation today patient appears to be doing well with regard to her wound. There is a little bit of callus buildup but nothing too significant she does tell me she was very active this week. Fortunately there is no signs of systemic infection at this point. The dorsal foot wound actually appears to be doing better. I think this is healed. 09/11/2020 upon evaluation today patient appears to actually be doing quite well in my opinion based on what I am seeing today. Fortunately there is no signs of infection in fact her mother is certain that this is even open any longer based on what I see. Fortunately I think the patient has been doing everything she can to try to keep  this under control.  09/18/2020 upon evaluation today patient appears to be doing about the same in regard to her foot ulcer. She has been tolerating the dressing changes without complication. Fortunately there is no sign of active infection at this time. No fevers, chills, nausea, vomiting, or diarrhea. She is going require some sharp debridement today. 09/25/2020 upon evaluation today patient appears to be doing well with regard to his wounds she has been tolerating the dressing changes without complication. Her wound appears to be completely healed based on what I am seeing at this point. There does not appear to be any signs of active infection at this time which is great news. No fevers, chills, nausea, vomiting, or diarrhea. She did get the cushion for her foot as well that she ordered to try to help keep pressure off of this area. That looks like it may be very beneficial for her to be honest. 10/02/2020 upon evaluation today patient appears to be doing more poorly in regard to her foot ulcer. Unfortunately this has reopened since we saw her last week. It apparently did not take too long at all for this to happen. She is obviously somewhat disappointed as she was hopeful that that would be time this thing would stay closed. Fortunately there is no evidence of active infection at this point. No fevers, chills, nausea, vomiting, or diarrhea. With that being said I am good have to perform a little bit of sharp debridement clear away some of the debris currently. Including a minimal amount of callus at this time. 10/16/2020 upon evaluation today patient appears to be doing about the same in regard to her foot ulcer unfortunately. There does not appear to be any signs of active infection which is great news. No fever chills noted she has been tolerating the dressing changes without complication which is great news. Unfortunately she tells me that she has been very depressed about the situation is getting  very frustrating to her that she continues to have issues despite everything that she is trying to do to stay off of her foot. She is extremely discouraged and I do hate to hear this. Obviously we have been hopeful that if she got her shoes that would make a difference she is actually can be getting those tomorrow but I am not certain that that alone is good to be enough to get this to heal. We may end up having to consider going back into a total contact cast to get this to heal and then subsequently once we get her healed get her into her new diabetic shoes which will and my hope anyway keep this from reopening again. 10/23/2020 upon evaluation today patient appears to be doing a little worse both in regard to the size of her wound as well as in regard to the fact that she has erythema surrounding the wound and wrap around the lateral part of her foot where she tells me has been somewhat sore. Fortunately there does not appear to be any evidence of infection systemically but locally definitely there is erythema and warmth consistent with local cellulitis. I am glad to remove some of the callus as well. 10/30/2020 upon evaluation today patient appears to be doing well with regard to her wound on the plantar aspect of her foot. This is significantly improved compared to last week. 10/30/2020 upon evaluation today patient actually is making excellent progress her wound appears to show signs of great improvement which is wonderful and that she is extremely pleased to hear  this. She actually leaves Sunday to go on her trip with her half sister and friends. 11/13/2020 on evaluation today patient's wound actually appears to be doing quite well. There does not appear to be any signs of infection it has been 2 weeks since have seen her she does have a little bit of callus buildup here today but at the same time I do not believe the vacation time set her back any just has not really made a lot of significant  improvement. She is done with her antibiotics at this point. 11/20/2020 upon evaluation today patient appears to be doing poorly in regard to her foot. In fact this appears to be showing signs of Infection. She has erythema and warmth that is concerning. I know she is very discouraged as this seems to be a recurrent issue. I think we may need to delve further into the possibility of something deeper going on here as far as a structural infection. 11/27/2020 patient presents for 1 week follow-up. She had a culture of her left foot ulcer that grew staph aureus sensitive to Augmentin. This was called in by [place], [place] this morning. She is currently on doxycycline for previous culture result. Patient also had an x-ray done of her left foot that had conflicting results. We asked for clarification and was told we would have clarification on Monday. Patient states overall she is doing well. She tries to stay off of her foot is much as possible. 12/04/2020 patient presents for 1 week follow-up. She is currently taking Augmentin and tolerating this well. It was confirmed that her x-ray had no acute osseous abnormalities. We switched her to collagen from silver alginate last week. Patient is overall doing well and trying to stay off her foot is much as possible. She has no complaints today. 12/11/2020 upon evaluation today patient appears to be doing well with regard to her wound. She has been tolerating the dressing changes without complication. Fortunately there is no signs of active infection noted at this point. I think she is definitely ready to go back into the cast. 12/18/2020 upon evaluation today patient appears to be doing well with regard to her wound. She has been tolerating the dressing changes without complication. Fortunately there is no signs of active infection at this time. No fevers, chills, nausea, vomiting, or diarrhea. 12/25/2020 upon evaluation today patient appears to be doing well in general in  regard to her plantar foot ulcer. With that being said she tells me currently that there does not appear to be any signs of pain or problems which is great in fact the wound appears to be almost completely closed which is also excellent news. I do not see any signs of infection which is great news as well. No fevers, chills, nausea, vomiting, or diarrhea. 5/25; patient presents for 1 week follow-up. She has had the cast in place for the past week and has tolerated this well. She states she is getting orthotics on 5/10. She currently denies signs of infection and has no issues today. 01/08/2021 upon evaluation today patient appears unfortunately to be present today with issues that she has been having with her plantar foot wound. This apparently has reoccurred unfortunately and is causing quite a bit of discomfort and problems for her. There does not appear to be any signs of active infection which is great news and overall I am very pleased with where things stand in that regard but nonetheless I think she is definitely worried about where  this can lead due to past history. She was just taken out of the cast and marked is healed last week. 01/15/2021 upon evaluation today patient unfortunately appears to be doing well but nonetheless continues to have issues with pressure and friction to her foot whenever she is not wearing the cast. Again today I think the wound is significantly improved and almost completely healed in 1 week's time after debridement last week. Nonetheless this has continued to be an issue for that reason we did do a MRI with contrast which really revealed no evidence whatsoever of an issue here. Again this is great but nonetheless still offers up the question what regularly to keep this from continuing to just reopen every time that we see. Again I really do not know exactly what to do is I feel like this falls on the ground outside of wound care more into a issue more specifically with  the orthopedic side of things. I understand the concern that a brace would probably be problematic for the patient. That makes complete sense to be honest. It would probably cause rubbing on other areas which would not be good. With that being said also I am not sure what else we can do other than just keeping her in the cast which is really not a feasible option either. Every time I take her out of the cast this reopens almost immediately. 01/22/2021 upon evaluation today patient appears to be doing well with regard to her foot ulcer. This is going to require little bit of sharp debridement today. Fortunately there does not appear to be any signs of active infection at this time. No fevers, chills, nausea, vomiting, or diarrhea. I did previously last week tell the patient that I would contact Dr. Nona Dell office I did do so I spoke with Larkin Ina who is the physician assistant for Dr. Doran Durand. Subsequently he did relate to me that they really did not feel like there was anything that could be done differently for the patient. That is from an orthopedic standpoint. He said that the patient could make an appoint with Dr. Doran Durand to discuss further but he was not really sure that would make a big difference in the treatment plan to be honest. Subsequently he further did recommend that potentially the patient after I offered a thought on the potential for a Crow boot could benefit from this since she seems to do well with a total contact cast. With that being said he did advise that [place] would probably be the way to go for that if need be. 01/29/2021 upon evaluation today patient appears to be doing well with regard to her foot ulcer. In fact this pretty much appears to be healed. With that being said of course I am reluctant to absolutely call it healed and just discharge her due to things we have had going on in the past. I think we still need to monitor and watch out for this for that reason I am going to  ensure nothing is hiding or potentially get a reopen honest by bringing her back next week after using some foam over the area today. 02/05/2021 on evaluation today patient's wound actually appears to be completely closed. I do not see anything open on anything even threatened open. With that being said she did have drainage on her dressing it may appear that she had a small blister that occurred, rupture, and has resolved. Overall I do not see anything else concerning at this point. 02/12/2021  upon evaluation today patient actually still continues to maintain closure as far as her wound is concerned this is actually long as she has been without anything reopening. She has been extremely careful with that however. At this point I think she does need to start slowly and carefully increasing her activity with her shoes wants to get checked tomorrow. She has her appointment with Dr. Prudy Feeler next Wednesday and the following Wednesday with Dr. Doran Durand. 03/12/2021 upon evaluation today patient appears to be doing a little worse obviously and the fact that she has been healed for a bit of time and then subsequently reopened in the past week. We got her scheduled for a repeat appointment here to try to get things back under control. Fortunately there does not appear to be any evidence of active infection. Unfortunately I do believe we need to clear away some of the callus and necrotic debris. 03/19/2021 upon evaluation today patient appears to be doing decently well in regard to her foot ulcer to be honest. Fortunately there does not appear to be any signs of active infection which is great news. No fevers, chills, nausea, vomiting, or diarrhea. Overall I am extremely pleased with where things are progressing. She did see the specialist at [facility] clinic in order to see about new shoes. With that being said they recommended at this point that the best option would probably be for the patient to continue along with the  current shoe although he did make some modifications to it and apparently he thinks that this would likely be quite well for her. I did check out the modifications made and it seems like it is can to keep pressure off decently well in my opinion. Overall I am pretty pleased with her experience there that he was not willing to just go for another 600. Shoes that insurance likely would not pay for when she has appropriately good pair currently that he can modify. 03/26/2021 upon evaluation today patient's wound unfortunately is doing about the same. I am really not seeing signs of improvement which is not what we were looking for. We were hoping that she would be doing better this week. Nonetheless I think putting her back in a total contact cast is probably inevitable at this point. Electronic Signature(s) Signed: 03/26/2021 4:13:58 PM By: Worthy Keeler PA-C Entered By: Worthy Keeler on 03/26/2021 16:13:57 -------------------------------------------------------------------------------- Physical Exam Details Patient Name: Date of Service: [name] [name], [name] RA E. 03/26/2021 3:30 PM Medical Record Number: [id number] Patient Account Number: [id number] Date of Birth/Sex: Treating RN: 01-28-1946 (75 y.o. F) Primary Care Provider: [name] Other Clinician: Referring Provider: Treating Provider/Extender: Landis Martins Weeks in Treatment: 39 Constitutional Well-nourished and well-hydrated in no acute distress. Respiratory normal breathing without difficulty. Psychiatric this patient is able to make decisions and demonstrates good insight into disease process. Alert and Oriented x 3. pleasant and cooperative. Notes Upon inspection patient's wound bed did require some sharp debridement in order to clear away some of the necrotic debris she tolerated the debridement today without complication and postdebridement the wound bed appears to be doing significantly better which is great  news. Electronic Signature(s) Signed: 03/26/2021 4:14:17 PM By: Worthy Keeler PA-C Entered By: Worthy Keeler on 03/26/2021 16:14:17 -------------------------------------------------------------------------------- Physician Orders Details Patient Name: Date of Service: [name] [name], [name] RA E. 03/26/2021 3:30 PM Medical Record Number: [id number] Patient Account Number: [id number] Date of Birth/Sex: Treating RN: July 08, 1946 (75 y.o. [name] Primary Care Provider:  [name] Other Clinician: Referring Provider: Treating Provider/Extender: Adele Schilder in Treatment: [phone] Verbal / Phone Orders: No Diagnosis Coding Follow-up Appointments ppointment in 1 week. - ****EXTRA TIME FOR TCC**** 75 minutes Return A Bathing/ Shower/ Hygiene May shower and wash wound with soap and water. Off-Loading Total Contact Cast to Left Lower Extremity Other: - custom diabetic shoes Additional Orders / Instructions Follow Nutritious Diet Wound Treatment Wound #[id number] - Foot Wound Laterality: Plantar, Left, Medial Prim Dressing: KerraCel Ag Gelling Fiber Dressing, 2x2 in (silver alginate) 3 x Per Week/7 Days ary Discharge Instructions: Apply silver alginate to wound bed as instructed Secondary Dressing: ComfortFoam Border, 3x3 in (silicone border) 3 x Per Week/7 Days Discharge Instructions: or large bandaide. Apply over primary dressing as directed. Electronic Signature(s) Signed: 03/26/2021 4:56:53 PM By: Worthy Keeler PA-C Signed: 03/26/2021 5:20:51 PM By: [name] Hammock RN Entered By: [name] [name] on 03/26/2021 16:07:19 -------------------------------------------------------------------------------- Problem List Details Patient Name: Date of Service: [name] Guarneri RBA RA E. 03/26/2021 3:30 PM Medical Record Number: [id number] Patient Account Number: [id number] Date of Birth/Sex: Treating RN: [birth year] (75 y.o. F) Primary Care Provider: [name] Other  Clinician: Referring Provider: Treating Provider/Extender: Landis Martins Weeks in Treatment: 11 Active Problems ICD-10 Encounter Code Description Active Date MDM Diagnosis E11.621 Type 2 diabetes mellitus with foot ulcer 01/08/2021 No Yes L97.522 Non-pressure chronic ulcer of other part of left foot with fat layer exposed 01/08/2021 No Yes M14.672 Charcot's joint, left ankle and foot 01/08/2021 No Yes E11.40 Type 2 diabetes mellitus with diabetic neuropathy, unspecified 01/08/2021 No Yes Inactive Problems Resolved Problems Electronic Signature(s) Signed: 03/26/2021 4:01:52 PM By: Worthy Keeler PA-C Entered By: Worthy Keeler on 03/26/2021 16:01:52 -------------------------------------------------------------------------------- Progress Note Details Patient Name: Date of Service: [name] [name], [name] 03/26/2021 3:30 PM Medical Record Number: [id number] Patient Account Number: [id number] Date of Birth/Sex: Treating RN: 1945/09/09 (75 y.o. F) Primary Care Provider: [name] Other Clinician: Referring Provider: Treating Provider/Extender: Adele Schilder in Treatment: 11 Subjective Chief Complaint Information obtained from Patient Left foot ulcer History of Present Illness (HPI) 01/17/2020 upon evaluation today patient presents for initial evaluation here in our clinic concerning issues she has been having with a left medial/plantar foot ulcer. This is actually been an issue for her since October 2020. She has been seeing Dr. Doran Durand for quite some time during that course. Fortunately there is no signs of active infection at this time. Or least no mention of this to have seen in general. With that being said unfortunately I do see some signs of erythema noted today that does have me concerned about the possibility of infection at this point in the surrounding area of the wound. There is also a warm to touch at the site which is somewhat concerning.  Fortunately there is no evidence of systemic infection which is great news. The patient does have a history of diabetes mellitus type 2, Charcot foot which is what led to the wound, and hypertension. She notes that she was in a cast for some time with Dr. Doran Durand for about 8 weeks. During that time they were utilizing according to the patient silver nitrate along with a foam doughnut and then Coban to secure in place in the cast in place. With that being said I do not have the actual records to review we are going to try to get a hold of those unfortunately they would not flow over into care everywhere  I did look today. She has been seeing Dr. Doran Durand and his physician assistant Larkin Ina up until the end of May and apparently is still seeing them on a regular basis every 2 weeks roughly. She has also tried Iodosorb without effect here. 01/24/2020 upon evaluation today patient actually appears to be doing quite well with regard to her wounds. She has been tolerating the dressing changes without complication. Fortunately there is no signs of active infection spreading which is good news. Her culture did show signs of Staph aureus I did place her on Augmentin due to the erythema surrounding the wound. With that being said the wound does appear to be doing better she has her longer walking cast/boot and I think that is actually good for her for the time being. I am considering reinitiating total contact cast when she gets back from vacation but next week she will actually be out of town at [employer] she knows not to get in the water but she still obviously is planning to enjoy herself she is going to take it easy on her foot however. 02/07/2020 upon evaluation today patient appears to be doing fairly well in regard to her ulcer on her foot. Fortunately there is no signs of severe infection at this time which is great news and overall very pleased in that regard. With that being said I do think that she could  still benefit from a total contact cast. Nonetheless she is using her walking boot which at least provide some protection and that it prevents some of the friction occurring when she is ambulating. 02/14/2020 upon evaluation today patient appears to be doing well with regard to her foot ulcer. This is actually measuring a little bit smaller yet again this week. Overall very pleased with where things stand and I do not see any signs of active infection at this time which is also good news. Since she is measuring better the patient has wanting to somewhat hold off on proceeding with the total contact cast which I think is reasonable at this point. 02/28/2020 on evaluation today patient appears to be doing well in general in regard to her wound although she has a lot of callus buildup as compared to last time I saw her. This is can require sharp debridement today. I do believe she really needs the total contact cast as well which we have discussed previous. 7/23; patient comes in for a total contact cast change 03/06/2020 on evaluation today patient appears to be doing quite well with regard to her wounds. Fortunately the wound bed is measuring smaller and looking much better there is little callus noted although there is some debridement necessary today. 03/13/2020 on evaluation today patient's wound actually appears to be doing excellent which is great news. With that being said unfortunately she is having some issues currently with her left leg where she does have cellulitis it appears. This may have come from an area that rubbed underneath the cast from last week that we noted we padded that area and it looks to be doing excellent at this point but nonetheless the leg was somewhat painful, swollen, and somewhat erythematous. She also had an elevated white blood cell count of 11.5 based on what I saw on looking at her records from the med center in [facility] from where she was seen yesterday. Unfortunately  with [place] having a provider on vacation there was no one here in the clinic in the afternoon when she called therefore she  went to the ER as advised. Subsequently they did not cut off the cast as they did not have anyone from orthopedics there to do so and subsequently also did not have the ability to do the Doppler for evaluation of DVT They recommended therefore given her dose of Eliquis as well as . Augmentin and sent her home to come see [place] today to have the cast taken off and then she is supposed to go back to have the study for DVT performed they are following. 03/20/2020 upon evaluation today patient appears to be doing well with regard to her foot all things considered we have not been able to use the total contact cast due to the infection that she had last week. She has been on the doxycycline and she had a 10-day supply of that I do believe that is helping and her leg appears to be doing better. With that being said there is fortunately no signs of active infection systemically at this time which is good news. No fevers, chills, nausea, vomiting, or diarrhea. 03/27/2020 upon evaluation today patient appears to be doing well with regard to her foot ulcer. There does not appear to be signs of active infection which is great news. Overall I am very pleased with where things stand at this point. 04/03/2020 upon evaluation today patient appears to be doing pretty well in regard to the overall appearance of her wound. Fortunately there is no signs of active infection at this time which is great news. No fevers, chills, nausea, vomiting, or diarrhea. With that being said she does have some blue-green drainage that actually is a little bit concerning to me for the possibility of Pseudomonas. I discussed that with the patient today. With that being said I do believe that we may be able to manage this however with the topical antibiotic cream as opposed to having to do anything oral especially since she  seems to be doing so well with overall appearance of the wound. 04/10/2020 on evaluation today patient appears to be doing about the same roughly in regard to the overall size of her wound. With that being said she fortunately has not shown any signs of worsening overall which is good news. I do believe that she is doing a great job trying to offload but again she may still do better with the cast. I do not see in the blue-green drainage that we noticed previously I do believe the gentamicin help in this regard. 04/17/2020 on evaluation today patient's wound appears to be doing about the same at this point. There is no significant improvement at this point. No fever chills noted. She is up for put the cast back on the day. That she states in a couple weeks she will need to have this off to go to a workshop. 04/24/2020 on evaluation today patient appears to be doing significantly better in regard to her wound. Fortunately there is no signs of active infection and overall feel like she is making great progress the cast seems to have done excellent for her. 05/01/2020 upon evaluation today patient presents for reevaluation she really does not appear to be doing too badly in regard to the actual wound on the left foot we have been managing. Unfortunately she has bilateral lower extremity edema with blisters between the webspace of her first and second toe on both feet. She has a tremendous amount of edema in the legs which I think is where this is coming from it does not  appear to be infected but nonetheless I do believe this is can be something that needs to be addressed today. Obviously this means we probably will not be putting the cast on at this point. She attributes this to the fact that she was sitting with her feet on the floor much longer during a conference last week she had a great time but unfortunately had a lot of complications as a result. 05/08/2020 upon evaluation today patient appears to be  doing somewhat better in regard to her wounds at this time. Fortunately there is no signs of active infection which is great news. With that being said I do believe that the blisters have ruptured and unfortunately did not just reattach I will remove some of the blistered tissue today. With that being said I do think the wound itself on the plantar aspect of left foot does need to have sharp debridement. 05/15/2020 upon evaluation today patient appears to be doing about the same in regard to her foot ulcer. Unfortunately in the past week her husband had a fall where he sustained a mild traumatic brain bleed. Fortunately he is doing better but being that he was in the hospital she had a walk on this a lot more. The wound does not appear to be any better is also not really appearing to be significantly worse which is good news. There is no signs of active infection at this time. 10/14; patient with a small diabetic wound on the medial part of her left foot. We have been using silver collagen a total contact cast making good progress. I think the patient had a series of blisters on her dorsal foot probably secondary to having her legs recumbent for 3 days while in a conference in [place]. We wrapped her leg last week these are all healed. We did not previously have her in compression on the right leg. 05/29/2020 upon evaluation today patient appears to be doing well with regard to the wound on the plantar aspect of her foot medially. This is measuring smaller and looking much better than last time I saw her. Again when I did see her last was 2 weeks back and the wound was significantly larger. I do believe the cast is helping and I believe the collagen is a good option for her. 06/05/2020 on evaluation today patient appears to be doing well with regard to her foot ulcer this is actually measuring significantly better and overall I feel like she is doing excellent. There is no signs of active infection at  this time. 06/12/2020 upon evaluation today patient actually continues to show signs of good improvement which is excellent news. I am extremely pleased with how she seems to be progressing at this point in regard to her wound. There is still some depth to the wound but I do believe the collagen is helping her quite a bit. 06/19/2020 upon evaluation today patient appears to be doing well with regard to her plantar foot ulcer. She is actually making excellent progress and in fact this appears to be almost completely healed. With that being said I do believe that the patient is going to actually require 1 more week in the cast although after that I am hopeful she will be ready for discharge. 06/26/2020 on evaluation today patient appears to be doing well in regard to her wound currently. Fortunately there is no signs of active infection in general I feel like she is doing excellent. This appears to be completely healed I  think she is ready to come out of the cast. 11/29; patient comes back in the clinic today with a very quick reopening in the exact same area on the medial plantar foot. She had been healed out last time. She went back into some new balance shoes that she got at [employer] with a custom insert. As it turns out this wound also happened when wearing these shoes although there was some modification made I think with the wound initially happened they added some foam around the wound area. This obviously is not going to be sufficient. 07/17/2020 on evaluation today patient appears to be doing well with regard to her wound. Fortunately she seems to be making good progress. Unfortunately she was in the hospital due to a issue with colitis and had just been discharged today in fact. She tells me that her biggest concern here is that a lot of her numbers especially her creatinine were somewhat elevated and problematic. She is can be following up with her provider in order to have a further work-up  at this time. With that being said she did need to come back in for a cast change. She did not allow them to remove the cast due to the fact that she did not feel like that was the issue whatsoever and indeed it does not appear that was the case her wound appears to be doing excellent today. 07/24/2020 upon evaluation today patient appears to be doing well in regard to her foot ulcer. She still has a small opening but this is showing signs of excellent improvement overall but they were very close to complete resolution. No fevers, chills, nausea, vomiting, or diarrhea. 07/31/2020 upon evaluation today patient actually appears to be doing excellent she is actually completely healed this is great news. Fortunately there is no signs of active infection at this time. No fevers, chills, nausea, vomiting, or diarrhea. The patient tells me that she did see Dr. Paulla Dolly in order to get to [place] so that she can have a custom shoe made. She saw him earlier last week. She does have an appointment with Liliane Channel on the sixth I believe on January she tells me Readmission: 08/28/2020 on evaluation today patient appears to be doing poorly in regard to her wound currently. She tells me this has reopened. The good news that she did get measured for and actually her shoes are on the way in from [facility].. With that being said she has been trying to stay off of this is much as possible using her wheelchair around home. Nonetheless has been somewhat difficult. The good news is her shoes should be here next week 09/04/2020 upon evaluation today patient appears to be doing well with regard to her wound. There is a little bit of callus buildup but nothing too significant she does tell me she was very active this week. Fortunately there is no signs of systemic infection at this point. The dorsal foot wound actually appears to be doing better. I think this is healed. 09/11/2020 upon evaluation today patient appears to actually be  doing quite well in my opinion based on what I am seeing today. Fortunately there is no signs of infection in fact her mother is certain that this is even open any longer based on what I see. Fortunately I think the patient has been doing everything she can to try to keep this under control. 09/18/2020 upon evaluation today patient appears to be doing about the same in regard to  her foot ulcer. She has been tolerating the dressing changes without complication. Fortunately there is no sign of active infection at this time. No fevers, chills, nausea, vomiting, or diarrhea. She is going require some sharp debridement today. 09/25/2020 upon evaluation today patient appears to be doing well with regard to his wounds she has been tolerating the dressing changes without complication. Her wound appears to be completely healed based on what I am seeing at this point. There does not appear to be any signs of active infection at this time which is great news. No fevers, chills, nausea, vomiting, or diarrhea. She did get the cushion for her foot as well that she ordered to try to help keep pressure off of this area. That looks like it may be very beneficial for her to be honest. 10/02/2020 upon evaluation today patient appears to be doing more poorly in regard to her foot ulcer. Unfortunately this has reopened since we saw her last week. It apparently did not take too long at all for this to happen. She is obviously somewhat disappointed as she was hopeful that that would be time this thing would stay closed. Fortunately there is no evidence of active infection at this point. No fevers, chills, nausea, vomiting, or diarrhea. With that being said I am good have to perform a little bit of sharp debridement clear away some of the debris currently. Including a minimal amount of callus at this time. 10/16/2020 upon evaluation today patient appears to be doing about the same in regard to her foot ulcer unfortunately. There  does not appear to be any signs of active infection which is great news. No fever chills noted she has been tolerating the dressing changes without complication which is great news. Unfortunately she tells me that she has been very depressed about the situation is getting very frustrating to her that she continues to have issues despite everything that she is trying to do to stay off of her foot. She is extremely discouraged and I do hate to hear this. Obviously we have been hopeful that if she got her shoes that would make a difference she is actually can be getting those tomorrow but I am not certain that that alone is good to be enough to get this to heal. We may end up having to consider going back into a total contact cast to get this to heal and then subsequently once we get her healed get her into her new diabetic shoes which will and my hope anyway keep this from reopening again. 10/23/2020 upon evaluation today patient appears to be doing a little worse both in regard to the size of her wound as well as in regard to the fact that she has erythema surrounding the wound and wrap around the lateral part of her foot where she tells me has been somewhat sore. Fortunately there does not appear to be any evidence of infection systemically but locally definitely there is erythema and warmth consistent with local cellulitis. I am glad to remove some of the callus as well. 10/30/2020 upon evaluation today patient appears to be doing well with regard to her wound on the plantar aspect of her foot. This is significantly improved compared to last week. 10/30/2020 upon evaluation today patient actually is making excellent progress her wound appears to show signs of great improvement which is wonderful and that she is extremely pleased to hear this. She actually leaves Sunday to go on her trip with her half sister and  friends. 11/13/2020 on evaluation today patient's wound actually appears to be doing quite  well. There does not appear to be any signs of infection it has been 2 weeks since have seen her she does have a little bit of callus buildup here today but at the same time I do not believe the vacation time set her back any just has not really made a lot of significant improvement. She is done with her antibiotics at this point. 11/20/2020 upon evaluation today patient appears to be doing poorly in regard to her foot. In fact this appears to be showing signs of Infection. She has erythema and warmth that is concerning. I know she is very discouraged as this seems to be a recurrent issue. I think we may need to delve further into the possibility of something deeper going on here as far as a structural infection. 11/27/2020 patient presents for 1 week follow-up. She had a culture of her left foot ulcer that grew staph aureus sensitive to Augmentin. This was called in by [place], [place] this morning. She is currently on doxycycline for previous culture result. Patient also had an x-ray done of her left foot that had conflicting results. We asked for clarification and was told we would have clarification on Monday. Patient states overall she is doing well. She tries to stay off of her foot is much as possible. 12/04/2020 patient presents for 1 week follow-up. She is currently taking Augmentin and tolerating this well. It was confirmed that her x-ray had no acute osseous abnormalities. We switched her to collagen from silver alginate last week. Patient is overall doing well and trying to stay off her foot is much as possible. She has no complaints today. 12/11/2020 upon evaluation today patient appears to be doing well with regard to her wound. She has been tolerating the dressing changes without complication. Fortunately there is no signs of active infection noted at this point. I think she is definitely ready to go back into the cast. 12/18/2020 upon evaluation today patient appears to be doing well with regard to  her wound. She has been tolerating the dressing changes without complication. Fortunately there is no signs of active infection at this time. No fevers, chills, nausea, vomiting, or diarrhea. 12/25/2020 upon evaluation today patient appears to be doing well in general in regard to her plantar foot ulcer. With that being said she tells me currently that there does not appear to be any signs of pain or problems which is great in fact the wound appears to be almost completely closed which is also excellent news. I do not see any signs of infection which is great news as well. No fevers, chills, nausea, vomiting, or diarrhea. 5/25; patient presents for 1 week follow-up. She has had the cast in place for the past week and has tolerated this well. She states she is getting orthotics on 5/10. She currently denies signs of infection and has no issues today. 01/08/2021 upon evaluation today patient appears unfortunately to be present today with issues that she has been having with her plantar foot wound. This apparently has reoccurred unfortunately and is causing quite a bit of discomfort and problems for her. There does not appear to be any signs of active infection which is great news and overall I am very pleased with where things stand in that regard but nonetheless I think she is definitely worried about where this can lead due to past history. She was just taken out of the cast  and marked is healed last week. 01/15/2021 upon evaluation today patient unfortunately appears to be doing well but nonetheless continues to have issues with pressure and friction to her foot whenever she is not wearing the cast. Again today I think the wound is significantly improved and almost completely healed in 1 week's time after debridement last week. Nonetheless this has continued to be an issue for that reason we did do a MRI with contrast which really revealed no evidence whatsoever of an issue here. Again this is great but  nonetheless still offers up the question what regularly to keep this from continuing to just reopen every time that we see. Again I really do not know exactly what to do is I feel like this falls on the ground outside of wound care more into a issue more specifically with the orthopedic side of things. I understand the concern that a brace would probably be problematic for the patient. That makes complete sense to be honest. It would probably cause rubbing on other areas which would not be good. With that being said also I am not sure what else we can do other than just keeping her in the cast which is really not a feasible option either. Every time I take her out of the cast this reopens almost immediately. 01/22/2021 upon evaluation today patient appears to be doing well with regard to her foot ulcer. This is going to require little bit of sharp debridement today. Fortunately there does not appear to be any signs of active infection at this time. No fevers, chills, nausea, vomiting, or diarrhea. I did previously last week tell the patient that I would contact Dr. Nona Dell office I did do so I spoke with Larkin Ina who is the physician assistant for Dr. Doran Durand. Subsequently he did relate to me that they really did not feel like there was anything that could be done differently for the patient. That is from an orthopedic standpoint. He said that the patient could make an appoint with Dr. Doran Durand to discuss further but he was not really sure that would make a big difference in the treatment plan to be honest. Subsequently he further did recommend that potentially the patient after I offered a thought on the potential for a Crow boot could benefit from this since she seems to do well with a total contact cast. With that being said he did advise that [place] would probably be the way to go for that if need be. 01/29/2021 upon evaluation today patient appears to be doing well with regard to her foot ulcer. In fact  this pretty much appears to be healed. With that being said of course I am reluctant to absolutely call it healed and just discharge her due to things we have had going on in the past. I think we still need to monitor and watch out for this for that reason I am going to ensure nothing is hiding or potentially get a reopen honest by bringing her back next week after using some foam over the area today. 02/05/2021 on evaluation today patient's wound actually appears to be completely closed. I do not see anything open on anything even threatened open. With that being said she did have drainage on her dressing it may appear that she had a small blister that occurred, rupture, and has resolved. Overall I do not see anything else concerning at this point. 02/12/2021 upon evaluation today patient actually still continues to maintain closure as far as her wound  is concerned this is actually long as she has been without anything reopening. She has been extremely careful with that however. At this point I think she does need to start slowly and carefully increasing her activity with her shoes wants to get checked tomorrow. She has her appointment with Dr. Prudy Feeler next Wednesday and the following Wednesday with Dr. Doran Durand. 03/12/2021 upon evaluation today patient appears to be doing a little worse obviously and the fact that she has been healed for a bit of time and then subsequently reopened in the past week. We got her scheduled for a repeat appointment here to try to get things back under control. Fortunately there does not appear to be any evidence of active infection. Unfortunately I do believe we need to clear away some of the callus and necrotic debris. 03/19/2021 upon evaluation today patient appears to be doing decently well in regard to her foot ulcer to be honest. Fortunately there does not appear to be any signs of active infection which is great news. No fevers, chills, nausea, vomiting, or diarrhea.  Overall I am extremely pleased with where things are progressing. She did see the specialist at [facility] clinic in order to see about new shoes. With that being said they recommended at this point that the best option would probably be for the patient to continue along with the current shoe although he did make some modifications to it and apparently he thinks that this would likely be quite well for her. I did check out the modifications made and it seems like it is can to keep pressure off decently well in my opinion. Overall I am pretty pleased with her experience there that he was not willing to just go for another 600. Shoes that insurance likely would not pay for when she has appropriately good pair currently that he can modify. 03/26/2021 upon evaluation today patient's wound unfortunately is doing about the same. I am really not seeing signs of improvement which is not what we were looking for. We were hoping that she would be doing better this week. Nonetheless I think putting her back in a total contact cast is probably inevitable at this point. Objective Constitutional Well-nourished and well-hydrated in no acute distress. Vitals Time Taken: 3:45 PM, Temperature: 99.2 F, Pulse: 69 bpm, Respiratory Rate: 18 breaths/min, Blood Pressure: 149/77 mmHg. Respiratory normal breathing without difficulty. Psychiatric this patient is able to make decisions and demonstrates good insight into disease process. Alert and Oriented x 3. pleasant and cooperative. General Notes: Upon inspection patient's wound bed did require some sharp debridement in order to clear away some of the necrotic debris she tolerated the debridement today without complication and postdebridement the wound bed appears to be doing significantly better which is great news. Integumentary (Hair, Skin) Wound #1RRRR status is Open. Original cause of wound was Gradually Appeared. The date acquired was: 05/11/2019. The wound has been in  treatment 62 weeks. The wound is located on the [place]. The wound measures 0.2cm length x 0.3cm width x 0.2cm depth; 0.047cm^2 area and 0.009cm^3 volume. There is Fat Layer (Subcutaneous Tissue) exposed. There is no tunneling or undermining noted. There is a medium amount of serosanguineous drainage noted. The wound margin is distinct with the outline attached to the wound base. There is large (67-100%) pink, pale granulation within the wound bed. There is no necrotic tissue within the wound bed. Assessment Active Problems ICD-10 Type 2 diabetes mellitus with foot ulcer Non-pressure chronic ulcer of other part  of left foot with fat layer exposed Charcot's joint, left ankle and foot Type 2 diabetes mellitus with diabetic neuropathy, unspecified Procedures Wound #1RRRR Pre-procedure diagnosis of Wound #1RRRR is a Diabetic Wound/Ulcer of the Lower Extremity located on the Left,Medial,Plantar Foot .Severity of Tissue Pre Debridement is: Fat layer exposed. There was a Excisional Skin/Subcutaneous Tissue Debridement with a total area of 0.06 sq cm performed by Worthy Keeler, PA. With the following instrument(s): Curette to remove Viable and Non-Viable tissue/material. Material removed includes Subcutaneous Tissue, Skin: Dermis, and Skin: Epidermis after achieving pain control using Lidocaine. No specimens were taken. A time out was conducted at 16:05, prior to the start of the procedure. A Moderate amount of bleeding was controlled with Pressure. The procedure was tolerated well with a pain level of 0 throughout and a pain level of 0 following the procedure. Post Debridement Measurements: 0.2cm length x 0.3cm width x 0.2cm depth; 0.009cm^3 volume. Character of Wound/Ulcer Post Debridement is improved. Severity of Tissue Post Debridement is: Fat layer exposed. Post procedure Diagnosis Wound #1RRRR: Same as Pre-Procedure Pre-procedure diagnosis of Wound #1RRRR is a Diabetic  Wound/Ulcer of the Lower Extremity located on the Left,Medial,Plantar Foot . There was a Total Contact Cast Procedure by Worthy Keeler, PA. Post procedure Diagnosis Wound #1RRRR: Same as Pre-Procedure Plan Follow-up Appointments: Return Appointment in 1 week. - ****EXTRA TIME FOR TCC**** 75 minutes Bathing/ Shower/ Hygiene: May shower and wash wound with soap and water. Off-Loading: T Contact Cast to Left Lower Extremity otal Other: - custom diabetic shoes Additional Orders / Instructions: Follow Nutritious Diet WOUND #1RRRR: - Foot Wound Laterality: Plantar, Left, Medial Prim Dressing: KerraCel Ag Gelling Fiber Dressing, 2x2 in (silver alginate) 3 x Per Week/7 Days ary Discharge Instructions: Apply silver alginate to wound bed as instructed Secondary Dressing: ComfortFoam Border, 3x3 in (silicone border) 3 x Per Week/7 Days Discharge Instructions: or large bandaide. Apply over primary dressing as directed. 1. Would recommend currently that we go ahead and continue with the silver alginate dressing I think this is still the best way to go. 2. I am also can recommend that we have the patient going to continue with the total contact cast we done this before she is always done well with that and I think this is appropriate currently as well. 3. I am hopeful once we get this healed up with the adjustments made to her diabetic shoe that she will be able to avoid this reopening at this point especially since over the past week it is definitely not gotten worse although has not also gotten any better at this time. We will see patient back for reevaluation in 1 week here in the clinic. If anything worsens or changes patient will contact our office for additional recommendations. Electronic Signature(s) Signed: 03/26/2021 4:46:54 PM By: Worthy Keeler PA-C Previous Signature: 03/26/2021 4:14:49 PM Version By: Worthy Keeler PA-C Entered By: Worthy Keeler on 03/26/2021  16:46:54 -------------------------------------------------------------------------------- Total Contact Cast Details Patient Name: Date of Service: LYNSEY, SCHIESSER RA E. 03/26/2021 3:30 PM Medical Record Number: [id number] Patient Account Number: [id number] Date of Birth/Sex: Treating RN: 10-29-1945 (75 y.o. [name] [name], [name] [name] Primary Care Provider: [name] Other Clinician: Referring Provider: Treating Provider/Extender: Adele Schilder in Treatment: [phone] T Contact Cast Applied for Wound Assessment: otal Wound #1RRRR Left,Medial,Plantar Foot Performed By: Physician Worthy Keeler, PA Post Procedure Diagnosis Same as Pre-procedure Electronic Signature(s) Signed: 03/26/2021 4:56:53 PM By: Worthy Keeler PA-C Signed:  03/26/2021 5:20:51 PM By: [name] Hammock RN Entered By: [name] [name] on 03/26/2021 16:34:57 -------------------------------------------------------------------------------- SuperBill Details Patient Name: Date of Service: [name] [name], [name] RA E. 03/26/2021 Medical Record Number: [id number] Patient Account Number: [id number] Date of Birth/Sex: Treating RN: 08-19-1945 (75 y.o. [name] [name], [name] [name] Primary Care Provider: [name] Other Clinician: Referring Provider: Treating Provider/Extender: Adele Schilder in Treatment: 11 Diagnosis Coding ICD-10 Codes Code Description E11.621 Type 2 diabetes mellitus with foot ulcer L97.522 Non-pressure chronic ulcer of other part of left foot with fat layer exposed M14.672 Charcot's joint, left ankle and foot E11.40 Type 2 diabetes mellitus with diabetic neuropathy, unspecified Facility Procedures CPT4 Code: [id number] Description: [id number] - DEB SUBQ TISSUE 20 SQ CM/< ICD-10 Diagnosis Description L97.522 Non-pressure chronic ulcer of other part of left foot with fat layer exposed Modifier: Quantity: 1 Physician Procedures : CPT4 Code Description Modifier [id number] - WC PHYS SUBQ TISS  20 SQ CM ICD-10 Diagnosis Description L97.522 Non-pressure chronic ulcer of other part of left foot with fat layer exposed Quantity: 1 Electronic Signature(s) Signed: 03/26/2021 4:47:01 PM By: Worthy Keeler PA-C Entered By: Worthy Keeler on 03/26/2021 16:47:00

## 2021-03-27 NOTE — Telephone Encounter (Signed)
Miquel Dunn Key: [id number] help? Call us at [phone] Outcome Additional Information Required Drug is covered by current benefit plan. No further PA activity needed Drug Carvedilol 6.'25MG'$  tablets Form [employer] Electronic PA Form [phone] NCPDP)

## 2021-04-02 ENCOUNTER — Other Ambulatory Visit: Payer: Self-pay

## 2021-04-02 ENCOUNTER — Encounter (HOSPITAL_BASED_OUTPATIENT_CLINIC_OR_DEPARTMENT_OTHER): Payer: Medicare Other | Admitting: Physician Assistant

## 2021-04-02 DIAGNOSIS — E1151 Type 2 diabetes mellitus with diabetic peripheral angiopathy without gangrene: Secondary | ICD-10-CM | POA: Diagnosis not present

## 2021-04-02 DIAGNOSIS — E1140 Type 2 diabetes mellitus with diabetic neuropathy, unspecified: Secondary | ICD-10-CM | POA: Diagnosis not present

## 2021-04-02 DIAGNOSIS — L97522 Non-pressure chronic ulcer of other part of left foot with fat layer exposed: Secondary | ICD-10-CM | POA: Diagnosis not present

## 2021-04-02 DIAGNOSIS — E11621 Type 2 diabetes mellitus with foot ulcer: Secondary | ICD-10-CM | POA: Diagnosis not present

## 2021-04-02 NOTE — Progress Notes (Addendum)
CARRISA, ROA ([id number]) Visit Report for 04/02/2021 Chief Complaint Document Details Patient Name: Date of Service: [name] [name], [name] E. 04/02/2021 1:30 PM Medical Record Number: [id number] Patient Account Number: [id number] Date of Birth/Sex: Treating RN: [birth year] (75 y.o. [name] [name] Primary Care Provider: [name] Other Clinician: Referring Provider: Treating Provider/Extender: Adele Schilder in Treatment: 12 Information Obtained from: Patient Chief Complaint Left foot ulcer Electronic Signature(s) Signed: 04/02/2021 1:56:05 PM By: Worthy Keeler PA-C Entered By: Worthy Keeler on 04/02/2021 13:56:05 -------------------------------------------------------------------------------- HPI Details Patient Name: Date of Service: [name] [name], [name] RA E. 04/02/2021 1:30 PM Medical Record Number: [id number] Patient Account Number: [id number] Date of Birth/Sex: Treating RN: [birth year] (75 y.o. [name] [name] Primary Care Provider: [name] Other Clinician: Referring Provider: Treating Provider/Extender: Adele Schilder in Treatment: 12 History of Present Illness HPI Description: 01/17/2020 upon evaluation today patient presents for initial evaluation here in our clinic concerning issues she has been having with a left medial/plantar foot ulcer. This is actually been an issue for her since October 2020. She has been seeing Dr. Doran Durand for quite some time during that course. Fortunately there is no signs of active infection at this time. Or least no mention of this to have seen in general. With that being said unfortunately I do see some signs of erythema noted today that does have me concerned about the possibility of infection at this point in the surrounding area of the wound. There is also a warm to touch at the site which is somewhat concerning. Fortunately there is no evidence of systemic infection which is great news. The patient  does have a history of diabetes mellitus type 2, Charcot foot which is what led to the wound, and hypertension. She notes that she was in a cast for some time with Dr. Doran Durand for about 8 weeks. During that time they were utilizing according to the patient silver nitrate along with a foam doughnut and then Coban to secure in place in the cast in place. With that being said I do not have the actual records to review we are going to try to get a hold of those unfortunately they would not flow over into care everywhere I did look today. She has been seeing Dr. Doran Durand and his physician assistant Larkin Ina up until the end of May and apparently is still seeing them on a regular basis every 2 weeks roughly. She has also tried Iodosorb without effect here. 01/24/2020 upon evaluation today patient actually appears to be doing quite well with regard to her wounds. She has been tolerating the dressing changes without complication. Fortunately there is no signs of active infection spreading which is good news. Her culture did show signs of Staph aureus I did place her on Augmentin due to the erythema surrounding the wound. With that being said the wound does appear to be doing better she has her longer walking cast/boot and I think that is actually good for her for the time being. I am considering reinitiating total contact cast when she gets back from vacation but next week she will actually be out of town at [employer] she knows not to get in the water but she still obviously is planning to enjoy herself she is going to take it easy on her foot however. 02/07/2020 upon evaluation today patient appears to be doing fairly well in regard to her ulcer on her foot. Fortunately there is no signs of  severe infection at this time which is great news and overall very pleased in that regard. With that being said I do think that she could still benefit from a total contact cast. Nonetheless she is using her walking boot which at  least provide some protection and that it prevents some of the friction occurring when she is ambulating. 02/14/2020 upon evaluation today patient appears to be doing well with regard to her foot ulcer. This is actually measuring a little bit smaller yet again this week. Overall very pleased with where things stand and I do not see any signs of active infection at this time which is also good news. Since she is measuring better the patient has wanting to somewhat hold off on proceeding with the total contact cast which I think is reasonable at this point. 02/28/2020 on evaluation today patient appears to be doing well in general in regard to her wound although she has a lot of callus buildup as compared to last time I saw her. This is can require sharp debridement today. I do believe she really needs the total contact cast as well which we have discussed previous. 7/23; patient comes in for a total contact cast change 03/06/2020 on evaluation today patient appears to be doing quite well with regard to her wounds. Fortunately the wound bed is measuring smaller and looking much better there is little callus noted although there is some debridement necessary today. 03/13/2020 on evaluation today patient's wound actually appears to be doing excellent which is great news. With that being said unfortunately she is having some issues currently with her left leg where she does have cellulitis it appears. This may have come from an area that rubbed underneath the cast from last week that we noted we padded that area and it looks to be doing excellent at this point but nonetheless the leg was somewhat painful, swollen, and somewhat erythematous. She also had an elevated white blood cell count of 11.5 based on what I saw on looking at her records from the med center in [place] from where she was seen yesterday. Unfortunately with [place] having a provider on vacation there was no one here in the clinic in the afternoon  when she called therefore she went to the ER as advised. Subsequently they did not cut off the cast as they did not have anyone from orthopedics there to do so and subsequently also did not have the ability to do the Doppler for evaluation of DVT They recommended therefore given her dose of Eliquis as well as . Augmentin and sent her home to come see [place] today to have the cast taken off and then she is supposed to go back to have the study for DVT performed they are following. 03/20/2020 upon evaluation today patient appears to be doing well with regard to her foot all things considered we have not been able to use the total contact cast due to the infection that she had last week. She has been on the doxycycline and she had a 10-day supply of that I do believe that is helping and her leg appears to be doing better. With that being said there is fortunately no signs of active infection systemically at this time which is good news. No fevers, chills, nausea, vomiting, or diarrhea. 03/27/2020 upon evaluation today patient appears to be doing well with regard to her foot ulcer. There does not appear to be signs of active infection which is great news. Overall I  am very pleased with where things stand at this point. 04/03/2020 upon evaluation today patient appears to be doing pretty well in regard to the overall appearance of her wound. Fortunately there is no signs of active infection at this time which is great news. No fevers, chills, nausea, vomiting, or diarrhea. With that being said she does have some blue-green drainage that actually is a little bit concerning to me for the possibility of Pseudomonas. I discussed that with the patient today. With that being said I do believe that we may be able to manage this however with the topical antibiotic cream as opposed to having to do anything oral especially since she seems to be doing so well with overall appearance of the wound. 04/10/2020 on evaluation  today patient appears to be doing about the same roughly in regard to the overall size of her wound. With that being said she fortunately has not shown any signs of worsening overall which is good news. I do believe that she is doing a great job trying to offload but again she may still do better with the cast. I do not see in the blue-green drainage that we noticed previously I do believe the gentamicin help in this regard. 04/17/2020 on evaluation today patient's wound appears to be doing about the same at this point. There is no significant improvement at this point. No fever chills noted. She is up for put the cast back on the day. That she states in a couple weeks she will need to have this off to go to a workshop. 04/24/2020 on evaluation today patient appears to be doing significantly better in regard to her wound. Fortunately there is no signs of active infection and overall feel like she is making great progress the cast seems to have done excellent for her. 05/01/2020 upon evaluation today patient presents for reevaluation she really does not appear to be doing too badly in regard to the actual wound on the left foot we have been managing. Unfortunately she has bilateral lower extremity edema with blisters between the webspace of her first and second toe on both feet. She has a tremendous amount of edema in the legs which I think is where this is coming from it does not appear to be infected but nonetheless I do believe this is can be something that needs to be addressed today. Obviously this means we probably will not be putting the cast on at this point. She attributes this to the fact that she was sitting with her feet on the floor much longer during a conference last week she had a great time but unfortunately had a lot of complications as a result. 05/08/2020 upon evaluation today patient appears to be doing somewhat better in regard to her wounds at this time. Fortunately there is no signs of  active infection which is great news. With that being said I do believe that the blisters have ruptured and unfortunately did not just reattach I will remove some of the blistered tissue today. With that being said I do think the wound itself on the plantar aspect of left foot does need to have sharp debridement. 05/15/2020 upon evaluation today patient appears to be doing about the same in regard to her foot ulcer. Unfortunately in the past week her husband had a fall where he sustained a mild traumatic brain bleed. Fortunately he is doing better but being that he was in the hospital she had a walk on this a lot more.  The wound does not appear to be any better is also not really appearing to be significantly worse which is good news. There is no signs of active infection at this time. 10/14; patient with a small diabetic wound on the medial part of her left foot. We have been using silver collagen a total contact cast making good progress. I think the patient had a series of blisters on her dorsal foot probably secondary to having her legs recumbent for 3 days while in a conference in [place]. We wrapped her leg last week these are all healed. We did not previously have her in compression on the right leg. 05/29/2020 upon evaluation today patient appears to be doing well with regard to the wound on the plantar aspect of her foot medially. This is measuring smaller and looking much better than last time I saw her. Again when I did see her last was 2 weeks back and the wound was significantly larger. I do believe the cast is helping and I believe the collagen is a good option for her. 06/05/2020 on evaluation today patient appears to be doing well with regard to her foot ulcer this is actually measuring significantly better and overall I feel like she is doing excellent. There is no signs of active infection at this time. 06/12/2020 upon evaluation today patient actually continues to show signs of good  improvement which is excellent news. I am extremely pleased with how she seems to be progressing at this point in regard to her wound. There is still some depth to the wound but I do believe the collagen is helping her quite a bit. 06/19/2020 upon evaluation today patient appears to be doing well with regard to her plantar foot ulcer. She is actually making excellent progress and in fact this appears to be almost completely healed. With that being said I do believe that the patient is going to actually require 1 more week in the cast although after that I am hopeful she will be ready for discharge. 06/26/2020 on evaluation today patient appears to be doing well in regard to her wound currently. Fortunately there is no signs of active infection in general I feel like she is doing excellent. This appears to be completely healed I think she is ready to come out of the cast. 11/29; patient comes back in the clinic today with a very quick reopening in the exact same area on the medial plantar foot. She had been healed out last time. She went back into some new balance shoes that she got at [employer] with a custom insert. As it turns out this wound also happened when wearing these shoes although there was some modification made I think with the wound initially happened they added some foam around the wound area. This obviously is not going to be sufficient. 07/17/2020 on evaluation today patient appears to be doing well with regard to her wound. Fortunately she seems to be making good progress. Unfortunately she was in the hospital due to a issue with colitis and had just been discharged today in fact. She tells me that her biggest concern here is that a lot of her numbers especially her creatinine were somewhat elevated and problematic. She is can be following up with her provider in order to have a further work-up at this time. With that being said she did need to come back in for a cast change. She did not  allow them to remove the cast due to the fact that  she did not feel like that was the issue whatsoever and indeed it does not appear that was the case her wound appears to be doing excellent today. 07/24/2020 upon evaluation today patient appears to be doing well in regard to her foot ulcer. She still has a small opening but this is showing signs of excellent improvement overall but they were very close to complete resolution. No fevers, chills, nausea, vomiting, or diarrhea. 07/31/2020 upon evaluation today patient actually appears to be doing excellent she is actually completely healed this is great news. Fortunately there is no signs of active infection at this time. No fevers, chills, nausea, vomiting, or diarrhea. The patient tells me that she did see Dr. Paulla Dolly in order to get to [place] so that she can have a custom shoe made. She saw him earlier last week. She does have an appointment with Liliane Channel on the sixth I believe on January she tells me Readmission: 08/28/2020 on evaluation today patient appears to be doing poorly in regard to her wound currently. She tells me this has reopened. The good news that she did get measured for and actually her shoes are on the way in from [facility].. With that being said she has been trying to stay off of this is much as possible using her wheelchair around home. Nonetheless has been somewhat difficult. The good news is her shoes should be here next week 09/04/2020 upon evaluation today patient appears to be doing well with regard to her wound. There is a little bit of callus buildup but nothing too significant she does tell me she was very active this week. Fortunately there is no signs of systemic infection at this point. The dorsal foot wound actually appears to be doing better. I think this is healed. 09/11/2020 upon evaluation today patient appears to actually be doing quite well in my opinion based on what I am seeing today. Fortunately there is no signs  of infection in fact her mother is certain that this is even open any longer based on what I see. Fortunately I think the patient has been doing everything she can to try to keep this under control. 09/18/2020 upon evaluation today patient appears to be doing about the same in regard to her foot ulcer. She has been tolerating the dressing changes without complication. Fortunately there is no sign of active infection at this time. No fevers, chills, nausea, vomiting, or diarrhea. She is going require some sharp debridement today. 09/25/2020 upon evaluation today patient appears to be doing well with regard to his wounds she has been tolerating the dressing changes without complication. Her wound appears to be completely healed based on what I am seeing at this point. There does not appear to be any signs of active infection at this time which is great news. No fevers, chills, nausea, vomiting, or diarrhea. She did get the cushion for her foot as well that she ordered to try to help keep pressure off of this area. That looks like it may be very beneficial for her to be honest. 10/02/2020 upon evaluation today patient appears to be doing more poorly in regard to her foot ulcer. Unfortunately this has reopened since we saw her last week. It apparently did not take too long at all for this to happen. She is obviously somewhat disappointed as she was hopeful that that would be time this thing would stay closed. Fortunately there is no evidence of active infection at this point. No fevers, chills, nausea,  vomiting, or diarrhea. With that being said I am good have to perform a little bit of sharp debridement clear away some of the debris currently. Including a minimal amount of callus at this time. 10/16/2020 upon evaluation today patient appears to be doing about the same in regard to her foot ulcer unfortunately. There does not appear to be any signs of active infection which is great news. No fever chills noted  she has been tolerating the dressing changes without complication which is great news. Unfortunately she tells me that she has been very depressed about the situation is getting very frustrating to her that she continues to have issues despite everything that she is trying to do to stay off of her foot. She is extremely discouraged and I do hate to hear this. Obviously we have been hopeful that if she got her shoes that would make a difference she is actually can be getting those tomorrow but I am not certain that that alone is good to be enough to get this to heal. We may end up having to consider going back into a total contact cast to get this to heal and then subsequently once we get her healed get her into her new diabetic shoes which will and my hope anyway keep this from reopening again. 10/23/2020 upon evaluation today patient appears to be doing a little worse both in regard to the size of her wound as well as in regard to the fact that she has erythema surrounding the wound and wrap around the lateral part of her foot where she tells me has been somewhat sore. Fortunately there does not appear to be any evidence of infection systemically but locally definitely there is erythema and warmth consistent with local cellulitis. I am glad to remove some of the callus as well. 10/30/2020 upon evaluation today patient appears to be doing well with regard to her wound on the plantar aspect of her foot. This is significantly improved compared to last week. 10/30/2020 upon evaluation today patient actually is making excellent progress her wound appears to show signs of great improvement which is wonderful and that she is extremely pleased to hear this. She actually leaves Sunday to go on her trip with her half sister and friends. 11/13/2020 on evaluation today patient's wound actually appears to be doing quite well. There does not appear to be any signs of infection it has been 2 weeks since have seen her  she does have a little bit of callus buildup here today but at the same time I do not believe the vacation time set her back any just has not really made a lot of significant improvement. She is done with her antibiotics at this point. 11/20/2020 upon evaluation today patient appears to be doing poorly in regard to her foot. In fact this appears to be showing signs of Infection. She has erythema and warmth that is concerning. I know she is very discouraged as this seems to be a recurrent issue. I think we may need to delve further into the possibility of something deeper going on here as far as a structural infection. 11/27/2020 patient presents for 1 week follow-up. She had a culture of her left foot ulcer that grew staph aureus sensitive to Augmentin. This was called in by [place], [place] this morning. She is currently on doxycycline for previous culture result. Patient also had an x-ray done of her left foot that had conflicting results. We asked for clarification and was told we  would have clarification on Monday. Patient states overall she is doing well. She tries to stay off of her foot is much as possible. 12/04/2020 patient presents for 1 week follow-up. She is currently taking Augmentin and tolerating this well. It was confirmed that her x-ray had no acute osseous abnormalities. We switched her to collagen from silver alginate last week. Patient is overall doing well and trying to stay off her foot is much as possible. She has no complaints today. 12/11/2020 upon evaluation today patient appears to be doing well with regard to her wound. She has been tolerating the dressing changes without complication. Fortunately there is no signs of active infection noted at this point. I think she is definitely ready to go back into the cast. 12/18/2020 upon evaluation today patient appears to be doing well with regard to her wound. She has been tolerating the dressing changes without complication. Fortunately there  is no signs of active infection at this time. No fevers, chills, nausea, vomiting, or diarrhea. 12/25/2020 upon evaluation today patient appears to be doing well in general in regard to her plantar foot ulcer. With that being said she tells me currently that there does not appear to be any signs of pain or problems which is great in fact the wound appears to be almost completely closed which is also excellent news. I do not see any signs of infection which is great news as well. No fevers, chills, nausea, vomiting, or diarrhea. 5/25; patient presents for 1 week follow-up. She has had the cast in place for the past week and has tolerated this well. She states she is getting orthotics on 5/10. She currently denies signs of infection and has no issues today. 01/08/2021 upon evaluation today patient appears unfortunately to be present today with issues that she has been having with her plantar foot wound. This apparently has reoccurred unfortunately and is causing quite a bit of discomfort and problems for her. There does not appear to be any signs of active infection which is great news and overall I am very pleased with where things stand in that regard but nonetheless I think she is definitely worried about where this can lead due to past history. She was just taken out of the cast and marked is healed last week. 01/15/2021 upon evaluation today patient unfortunately appears to be doing well but nonetheless continues to have issues with pressure and friction to her foot whenever she is not wearing the cast. Again today I think the wound is significantly improved and almost completely healed in 1 week's time after debridement last week. Nonetheless this has continued to be an issue for that reason we did do a MRI with contrast which really revealed no evidence whatsoever of an issue here. Again this is great but nonetheless still offers up the question what regularly to keep this from continuing to just reopen  every time that we see. Again I really do not know exactly what to do is I feel like this falls on the ground outside of wound care more into a issue more specifically with the orthopedic side of things. I understand the concern that a brace would probably be problematic for the patient. That makes complete sense to be honest. It would probably cause rubbing on other areas which would not be good. With that being said also I am not sure what else we can do other than just keeping her in the cast which is really not a feasible option either. Every  time I take her out of the cast this reopens almost immediately. 01/22/2021 upon evaluation today patient appears to be doing well with regard to her foot ulcer. This is going to require little bit of sharp debridement today. Fortunately there does not appear to be any signs of active infection at this time. No fevers, chills, nausea, vomiting, or diarrhea. I did previously last week tell the patient that I would contact Dr. Nona Dell office I did do so I spoke with Larkin Ina who is the physician assistant for Dr. Doran Durand. Subsequently he did relate to me that they really did not feel like there was anything that could be done differently for the patient. That is from an orthopedic standpoint. He said that the patient could make an appoint with Dr. Doran Durand to discuss further but he was not really sure that would make a big difference in the treatment plan to be honest. Subsequently he further did recommend that potentially the patient after I offered a thought on the potential for a Crow boot could benefit from this since she seems to do well with a total contact cast. With that being said he did advise that Alpha would probably be the way to go for that if need be. 01/29/2021 upon evaluation today patient appears to be doing well with regard to her foot ulcer. In fact this pretty much appears to be healed. With that being said of course I am reluctant to absolutely  call it healed and just discharge her due to things we have had going on in the past. I think we still need to monitor and watch out for this for that reason I am going to ensure nothing is hiding or potentially get a reopen honest by bringing her back next week after using some foam over the area today. 02/05/2021 on evaluation today patient's wound actually appears to be completely closed. I do not see anything open on anything even threatened open. With that being said she did have drainage on her dressing it may appear that she had a small blister that occurred, rupture, and has resolved. Overall I do not see anything else concerning at this point. 02/12/2021 upon evaluation today patient actually still continues to maintain closure as far as her wound is concerned this is actually long as she has been without anything reopening. She has been extremely careful with that however. At this point I think she does need to start slowly and carefully increasing her activity with her shoes wants to get checked tomorrow. She has her appointment with Dr. Prudy Feeler next Wednesday and the following Wednesday with Dr. Doran Durand. 03/12/2021 upon evaluation today patient appears to be doing a little worse obviously and the fact that she has been healed for a bit of time and then subsequently reopened in the past week. We got her scheduled for a repeat appointment here to try to get things back under control. Fortunately there does not appear to be any evidence of active infection. Unfortunately I do believe we need to clear away some of the callus and necrotic debris. 03/19/2021 upon evaluation today patient appears to be doing decently well in regard to her foot ulcer to be honest. Fortunately there does not appear to be any signs of active infection which is great news. No fevers, chills, nausea, vomiting, or diarrhea. Overall I am extremely pleased with where things are progressing. She did see the specialist at [facility] in order to see about new shoes. With that being said  they recommended at this point that the best option would probably be for the patient to continue along with the current shoe although he did make some modifications to it and apparently he thinks that this would likely be quite well for her. I did check out the modifications made and it seems like it is can to keep pressure off decently well in my opinion. Overall I am pretty pleased with her experience there that he was not willing to just go for another 600. Shoes that insurance likely would not pay for when she has appropriately good pair currently that he can modify. 03/26/2021 upon evaluation today patient's wound unfortunately is doing about the same. I am really not seeing signs of improvement which is not what we were looking for. We were hoping that she would be doing better this week. Nonetheless I think putting her back in a total contact cast is probably inevitable at this point. 04/02/2021 upon evaluation today patient appears to be doing well with regard to her wound compared to even last week. Fortunately there is no signs of active infection and overall I am actually extremely pleased with where things stand this is a least half the size it was last week. Overall I think she is very close to complete resolution. My goal would be to see whether or not we can get this closed in the next week so she will be able to eat the following week have nothing but her diabetic shoe and see if that sufficient to keep her wound protected enough to get it to heal. Overall I am extremely happy in general with where things stand. Electronic Signature(s) Signed: 04/02/2021 2:24:37 PM By: Worthy Keeler PA-C Entered By: Worthy Keeler on 04/02/2021 14:24:36 -------------------------------------------------------------------------------- Physical Exam Details Patient Name: Date of Service: [name] [name], [name] RA E. 04/02/2021 1:30 PM Medical Record  Number: [id number] Patient Account Number: [id number] Date of Birth/Sex: Treating RN: 15-Sep-1945 (75 y.o. [name] [name] Primary Care Provider: [name] Other Clinician: Referring Provider: Treating Provider/Extender: Landis Martins Weeks in Treatment: 53 Constitutional Well-nourished and well-hydrated in no acute distress. Respiratory normal breathing without difficulty. Psychiatric this patient is able to make decisions and demonstrates good insight into disease process. Alert and Oriented x 3. pleasant and cooperative. Notes Upon inspection patient's wound bed showed signs of good granulation epithelization at this point. Fortunately there does not appear to be any signs of active infection which is great news and overall I am extremely pleased with where things stand today. Electronic Signature(s) Signed: 04/02/2021 2:24:51 PM By: Worthy Keeler PA-C Entered By: Worthy Keeler on 04/02/2021 14:24:50 -------------------------------------------------------------------------------- Physician Orders Details Patient Name: Date of Service: [name] [name], [name] RA E. 04/02/2021 1:30 PM Medical Record Number: [id number] Patient Account Number: [id number] Date of Birth/Sex: Treating RN: 1945-12-13 (75 y.o. [name] [name] Primary Care Provider: [name] Other Clinician: Referring Provider: Treating Provider/Extender: Adele Schilder in Treatment: 12 Verbal / Phone Orders: No Diagnosis Coding ICD-10 Coding Code Description E11.621 Type 2 diabetes mellitus with foot ulcer L97.522 Non-pressure chronic ulcer of other part of left foot with fat layer exposed M14.672 Charcot's joint, left ankle and foot E11.40 Type 2 diabetes mellitus with diabetic neuropathy, unspecified Follow-up Appointments ppointment in 1 week. - ****EXTRA TIME FOR TCC**** 60 minutes Return A Bathing/ Shower/ Hygiene May shower with protection but do not get wound  dressing(s) wet. Off-Loading Total Contact Cast to Left Lower Extremity Other: -  custom diabetic shoes Additional Orders / Instructions Follow Nutritious Diet Wound Treatment Wound #1RRRR - Foot Wound Laterality: Plantar, Left, Medial Prim Dressing: KerraCel Ag Gelling Fiber Dressing, 2x2 in (silver alginate) 1 x Per Week/7 Days ary Discharge Instructions: Apply silver alginate to wound bed as instructed Secondary Dressing: ComfortFoam Border, 3x3 in (silicone border) 1 x Per Week/7 Days Discharge Instructions: or large bandaide. Apply over primary dressing as directed. Electronic Signature(s) Signed: 04/02/2021 5:35:19 PM By: [name] RN, BSN Signed: 04/02/2021 6:37:42 PM By: Worthy Keeler PA-C Entered By: [name] on 04/02/2021 14:21:39 -------------------------------------------------------------------------------- Problem List Details Patient Name: Date of Service: [name] Guarneri RBA RA E. 04/02/2021 1:30 PM Medical Record Number: [id number] Patient Account Number: [id number] Date of Birth/Sex: Treating RN: [birth year] (75 y.o. [name] [name] Primary Care Provider: [name] Other Clinician: Referring Provider: Treating Provider/Extender: Adele Schilder in Treatment: 12 Active Problems ICD-10 Encounter Code Description Active Date MDM Diagnosis E11.621 Type 2 diabetes mellitus with foot ulcer 01/08/2021 No Yes L97.522 Non-pressure chronic ulcer of other part of left foot with fat layer exposed 01/08/2021 No Yes M14.672 Charcot's joint, left ankle and foot 01/08/2021 No Yes E11.40 Type 2 diabetes mellitus with diabetic neuropathy, unspecified 01/08/2021 No Yes Inactive Problems Resolved Problems Electronic Signature(s) Signed: 04/02/2021 1:55:54 PM By: Worthy Keeler PA-C Entered By: Worthy Keeler on 04/02/2021 13:55:54 -------------------------------------------------------------------------------- Progress Note Details Patient Name: Date  of Service: [name] [name], [name] RA E. 04/02/2021 1:30 PM Medical Record Number: [id number] Patient Account Number: [id number] Date of Birth/Sex: Treating RN: 1945-09-05 (75 y.o. [name] [name] Primary Care Provider: [name] Other Clinician: Referring Provider: Treating Provider/Extender: Adele Schilder in Treatment: 12 Subjective Chief Complaint Information obtained from Patient Left foot ulcer History of Present Illness (HPI) 01/17/2020 upon evaluation today patient presents for initial evaluation here in our clinic concerning issues she has been having with a left medial/plantar foot ulcer. This is actually been an issue for her since October 2020. She has been seeing Dr. Doran Durand for quite some time during that course. Fortunately there is no signs of active infection at this time. Or least no mention of this to have seen in general. With that being said unfortunately I do see some signs of erythema noted today that does have me concerned about the possibility of infection at this point in the surrounding area of the wound. There is also a warm to touch at the site which is somewhat concerning. Fortunately there is no evidence of systemic infection which is great news. The patient does have a history of diabetes mellitus type 2, Charcot foot which is what led to the wound, and hypertension. She notes that she was in a cast for some time with Dr. Doran Durand for about 8 weeks. During that time they were utilizing according to the patient silver nitrate along with a foam doughnut and then Coban to secure in place in the cast in place. With that being said I do not have the actual records to review we are going to try to get a hold of those unfortunately they would not flow over into care everywhere I did look today. She has been seeing Dr. Doran Durand and his physician assistant Larkin Ina up until the end of May and apparently is still seeing them on a regular basis every 2 weeks roughly.  She has also tried Iodosorb without effect here. 01/24/2020 upon evaluation today patient actually appears to be doing quite well with  regard to her wounds. She has been tolerating the dressing changes without complication. Fortunately there is no signs of active infection spreading which is good news. Her culture did show signs of Staph aureus I did place her on Augmentin due to the erythema surrounding the wound. With that being said the wound does appear to be doing better she has her longer walking cast/boot and I think that is actually good for her for the time being. I am considering reinitiating total contact cast when she gets back from vacation but next week she will actually be out of town at [employer] she knows not to get in the water but she still obviously is planning to enjoy herself she is going to take it easy on her foot however. 02/07/2020 upon evaluation today patient appears to be doing fairly well in regard to her ulcer on her foot. Fortunately there is no signs of severe infection at this time which is great news and overall very pleased in that regard. With that being said I do think that she could still benefit from a total contact cast. Nonetheless she is using her walking boot which at least provide some protection and that it prevents some of the friction occurring when she is ambulating. 02/14/2020 upon evaluation today patient appears to be doing well with regard to her foot ulcer. This is actually measuring a little bit smaller yet again this week. Overall very pleased with where things stand and I do not see any signs of active infection at this time which is also good news. Since she is measuring better the patient has wanting to somewhat hold off on proceeding with the total contact cast which I think is reasonable at this point. 02/28/2020 on evaluation today patient appears to be doing well in general in regard to her wound although she has a lot of callus buildup as  compared to last time I saw her. This is can require sharp debridement today. I do believe she really needs the total contact cast as well which we have discussed previous. 7/23; patient comes in for a total contact cast change 03/06/2020 on evaluation today patient appears to be doing quite well with regard to her wounds. Fortunately the wound bed is measuring smaller and looking much better there is little callus noted although there is some debridement necessary today. 03/13/2020 on evaluation today patient's wound actually appears to be doing excellent which is great news. With that being said unfortunately she is having some issues currently with her left leg where she does have cellulitis it appears. This may have come from an area that rubbed underneath the cast from last week that we noted we padded that area and it looks to be doing excellent at this point but nonetheless the leg was somewhat painful, swollen, and somewhat erythematous. She also had an elevated white blood cell count of 11.5 based on what I saw on looking at her records from the med center in [facility] from where she was seen yesterday. Unfortunately with [place] having a provider on vacation there was no one here in the clinic in the afternoon when she called therefore she went to the ER as advised. Subsequently they did not cut off the cast as they did not have anyone from orthopedics there to do so and subsequently also did not have the ability to do the Doppler for evaluation of DVT They recommended therefore given her dose of Eliquis as well as . Augmentin and sent  her home to come see [place] today to have the cast taken off and then she is supposed to go back to have the study for DVT performed they are following. 03/20/2020 upon evaluation today patient appears to be doing well with regard to her foot all things considered we have not been able to use the total contact cast due to the infection that she had last week. She has  been on the doxycycline and she had a 10-day supply of that I do believe that is helping and her leg appears to be doing better. With that being said there is fortunately no signs of active infection systemically at this time which is good news. No fevers, chills, nausea, vomiting, or diarrhea. 03/27/2020 upon evaluation today patient appears to be doing well with regard to her foot ulcer. There does not appear to be signs of active infection which is great news. Overall I am very pleased with where things stand at this point. 04/03/2020 upon evaluation today patient appears to be doing pretty well in regard to the overall appearance of her wound. Fortunately there is no signs of active infection at this time which is great news. No fevers, chills, nausea, vomiting, or diarrhea. With that being said she does have some blue-green drainage that actually is a little bit concerning to me for the possibility of Pseudomonas. I discussed that with the patient today. With that being said I do believe that we may be able to manage this however with the topical antibiotic cream as opposed to having to do anything oral especially since she seems to be doing so well with overall appearance of the wound. 04/10/2020 on evaluation today patient appears to be doing about the same roughly in regard to the overall size of her wound. With that being said she fortunately has not shown any signs of worsening overall which is good news. I do believe that she is doing a great job trying to offload but again she may still do better with the cast. I do not see in the blue-green drainage that we noticed previously I do believe the gentamicin help in this regard. 04/17/2020 on evaluation today patient's wound appears to be doing about the same at this point. There is no significant improvement at this point. No fever chills noted. She is up for put the cast back on the day. That she states in a couple weeks she will need to have this  off to go to a workshop. 04/24/2020 on evaluation today patient appears to be doing significantly better in regard to her wound. Fortunately there is no signs of active infection and overall feel like she is making great progress the cast seems to have done excellent for her. 05/01/2020 upon evaluation today patient presents for reevaluation she really does not appear to be doing too badly in regard to the actual wound on the left foot we have been managing. Unfortunately she has bilateral lower extremity edema with blisters between the webspace of her first and second toe on both feet. She has a tremendous amount of edema in the legs which I think is where this is coming from it does not appear to be infected but nonetheless I do believe this is can be something that needs to be addressed today. Obviously this means we probably will not be putting the cast on at this point. She attributes this to the fact that she was sitting with her feet on the floor much longer during a conference  last week she had a great time but unfortunately had a lot of complications as a result. 05/08/2020 upon evaluation today patient appears to be doing somewhat better in regard to her wounds at this time. Fortunately there is no signs of active infection which is great news. With that being said I do believe that the blisters have ruptured and unfortunately did not just reattach I will remove some of the blistered tissue today. With that being said I do think the wound itself on the plantar aspect of left foot does need to have sharp debridement. 05/15/2020 upon evaluation today patient appears to be doing about the same in regard to her foot ulcer. Unfortunately in the past week her husband had a fall where he sustained a mild traumatic brain bleed. Fortunately he is doing better but being that he was in the hospital she had a walk on this a lot more. The wound does not appear to be any better is also not really appearing to  be significantly worse which is good news. There is no signs of active infection at this time. 10/14; patient with a small diabetic wound on the medial part of her left foot. We have been using silver collagen a total contact cast making good progress. I think the patient had a series of blisters on her dorsal foot probably secondary to having her legs recumbent for 3 days while in a conference in [place]. We wrapped her leg last week these are all healed. We did not previously have her in compression on the right leg. 05/29/2020 upon evaluation today patient appears to be doing well with regard to the wound on the plantar aspect of her foot medially. This is measuring smaller and looking much better than last time I saw her. Again when I did see her last was 2 weeks back and the wound was significantly larger. I do believe the cast is helping and I believe the collagen is a good option for her. 06/05/2020 on evaluation today patient appears to be doing well with regard to her foot ulcer this is actually measuring significantly better and overall I feel like she is doing excellent. There is no signs of active infection at this time. 06/12/2020 upon evaluation today patient actually continues to show signs of good improvement which is excellent news. I am extremely pleased with how she seems to be progressing at this point in regard to her wound. There is still some depth to the wound but I do believe the collagen is helping her quite a bit. 06/19/2020 upon evaluation today patient appears to be doing well with regard to her plantar foot ulcer. She is actually making excellent progress and in fact this appears to be almost completely healed. With that being said I do believe that the patient is going to actually require 1 more week in the cast although after that I am hopeful she will be ready for discharge. 06/26/2020 on evaluation today patient appears to be doing well in regard to her wound  currently. Fortunately there is no signs of active infection in general I feel like she is doing excellent. This appears to be completely healed I think she is ready to come out of the cast. 11/29; patient comes back in the clinic today with a very quick reopening in the exact same area on the medial plantar foot. She had been healed out last time. She went back into some new balance shoes that she got at [employer] with a custom  insert. As it turns out this wound also happened when wearing these shoes although there was some modification made I think with the wound initially happened they added some foam around the wound area. This obviously is not going to be sufficient. 07/17/2020 on evaluation today patient appears to be doing well with regard to her wound. Fortunately she seems to be making good progress. Unfortunately she was in the hospital due to a issue with colitis and had just been discharged today in fact. She tells me that her biggest concern here is that a lot of her numbers especially her creatinine were somewhat elevated and problematic. She is can be following up with her provider in order to have a further work-up at this time. With that being said she did need to come back in for a cast change. She did not allow them to remove the cast due to the fact that she did not feel like that was the issue whatsoever and indeed it does not appear that was the case her wound appears to be doing excellent today. 07/24/2020 upon evaluation today patient appears to be doing well in regard to her foot ulcer. She still has a small opening but this is showing signs of excellent improvement overall but they were very close to complete resolution. No fevers, chills, nausea, vomiting, or diarrhea. 07/31/2020 upon evaluation today patient actually appears to be doing excellent she is actually completely healed this is great news. Fortunately there is no signs of active infection at this time. No fevers,  chills, nausea, vomiting, or diarrhea. The patient tells me that she did see Dr. Paulla Dolly in order to get to [place] so that she can have a custom shoe made. She saw him earlier last week. She does have an appointment with Liliane Channel on the sixth I believe on January she tells me Readmission: 08/28/2020 on evaluation today patient appears to be doing poorly in regard to her wound currently. She tells me this has reopened. The good news that she did get measured for and actually her shoes are on the way in from [facility].. With that being said she has been trying to stay off of this is much as possible using her wheelchair around home. Nonetheless has been somewhat difficult. The good news is her shoes should be here next week 09/04/2020 upon evaluation today patient appears to be doing well with regard to her wound. There is a little bit of callus buildup but nothing too significant she does tell me she was very active this week. Fortunately there is no signs of systemic infection at this point. The dorsal foot wound actually appears to be doing better. I think this is healed. 09/11/2020 upon evaluation today patient appears to actually be doing quite well in my opinion based on what I am seeing today. Fortunately there is no signs of infection in fact her mother is certain that this is even open any longer based on what I see. Fortunately I think the patient has been doing everything she can to try to keep this under control. 09/18/2020 upon evaluation today patient appears to be doing about the same in regard to her foot ulcer. She has been tolerating the dressing changes without complication. Fortunately there is no sign of active infection at this time. No fevers, chills, nausea, vomiting, or diarrhea. She is going require some sharp debridement today. 09/25/2020 upon evaluation today patient appears to be doing well with regard to his wounds she has been tolerating  the dressing changes without  complication. Her wound appears to be completely healed based on what I am seeing at this point. There does not appear to be any signs of active infection at this time which is great news. No fevers, chills, nausea, vomiting, or diarrhea. She did get the cushion for her foot as well that she ordered to try to help keep pressure off of this area. That looks like it may be very beneficial for her to be honest. 10/02/2020 upon evaluation today patient appears to be doing more poorly in regard to her foot ulcer. Unfortunately this has reopened since we saw her last week. It apparently did not take too long at all for this to happen. She is obviously somewhat disappointed as she was hopeful that that would be time this thing would stay closed. Fortunately there is no evidence of active infection at this point. No fevers, chills, nausea, vomiting, or diarrhea. With that being said I am good have to perform a little bit of sharp debridement clear away some of the debris currently. Including a minimal amount of callus at this time. 10/16/2020 upon evaluation today patient appears to be doing about the same in regard to her foot ulcer unfortunately. There does not appear to be any signs of active infection which is great news. No fever chills noted she has been tolerating the dressing changes without complication which is great news. Unfortunately she tells me that she has been very depressed about the situation is getting very frustrating to her that she continues to have issues despite everything that she is trying to do to stay off of her foot. She is extremely discouraged and I do hate to hear this. Obviously we have been hopeful that if she got her shoes that would make a difference she is actually can be getting those tomorrow but I am not certain that that alone is good to be enough to get this to heal. We may end up having to consider going back into a total contact cast to get this to heal and then  subsequently once we get her healed get her into her new diabetic shoes which will and my hope anyway keep this from reopening again. 10/23/2020 upon evaluation today patient appears to be doing a little worse both in regard to the size of her wound as well as in regard to the fact that she has erythema surrounding the wound and wrap around the lateral part of her foot where she tells me has been somewhat sore. Fortunately there does not appear to be any evidence of infection systemically but locally definitely there is erythema and warmth consistent with local cellulitis. I am glad to remove some of the callus as well. 10/30/2020 upon evaluation today patient appears to be doing well with regard to her wound on the plantar aspect of her foot. This is significantly improved compared to last week. 10/30/2020 upon evaluation today patient actually is making excellent progress her wound appears to show signs of great improvement which is wonderful and that she is extremely pleased to hear this. She actually leaves Sunday to go on her trip with her half sister and friends. 11/13/2020 on evaluation today patient's wound actually appears to be doing quite well. There does not appear to be any signs of infection it has been 2 weeks since have seen her she does have a little bit of callus buildup here today but at the same time I do not believe the vacation time  set her back any just has not really made a lot of significant improvement. She is done with her antibiotics at this point. 11/20/2020 upon evaluation today patient appears to be doing poorly in regard to her foot. In fact this appears to be showing signs of Infection. She has erythema and warmth that is concerning. I know she is very discouraged as this seems to be a recurrent issue. I think we may need to delve further into the possibility of something deeper going on here as far as a structural infection. 11/27/2020 patient presents for 1 week follow-up.  She had a culture of her left foot ulcer that grew staph aureus sensitive to Augmentin. This was called in by [place], [place] this morning. She is currently on doxycycline for previous culture result. Patient also had an x-ray done of her left foot that had conflicting results. We asked for clarification and was told we would have clarification on Monday. Patient states overall she is doing well. She tries to stay off of her foot is much as possible. 12/04/2020 patient presents for 1 week follow-up. She is currently taking Augmentin and tolerating this well. It was confirmed that her x-ray had no acute osseous abnormalities. We switched her to collagen from silver alginate last week. Patient is overall doing well and trying to stay off her foot is much as possible. She has no complaints today. 12/11/2020 upon evaluation today patient appears to be doing well with regard to her wound. She has been tolerating the dressing changes without complication. Fortunately there is no signs of active infection noted at this point. I think she is definitely ready to go back into the cast. 12/18/2020 upon evaluation today patient appears to be doing well with regard to her wound. She has been tolerating the dressing changes without complication. Fortunately there is no signs of active infection at this time. No fevers, chills, nausea, vomiting, or diarrhea. 12/25/2020 upon evaluation today patient appears to be doing well in general in regard to her plantar foot ulcer. With that being said she tells me currently that there does not appear to be any signs of pain or problems which is great in fact the wound appears to be almost completely closed which is also excellent news. I do not see any signs of infection which is great news as well. No fevers, chills, nausea, vomiting, or diarrhea. 5/25; patient presents for 1 week follow-up. She has had the cast in place for the past week and has tolerated this well. She states she is  getting orthotics on 5/10. She currently denies signs of infection and has no issues today. 01/08/2021 upon evaluation today patient appears unfortunately to be present today with issues that she has been having with her plantar foot wound. This apparently has reoccurred unfortunately and is causing quite a bit of discomfort and problems for her. There does not appear to be any signs of active infection which is great news and overall I am very pleased with where things stand in that regard but nonetheless I think she is definitely worried about where this can lead due to past history. She was just taken out of the cast and marked is healed last week. 01/15/2021 upon evaluation today patient unfortunately appears to be doing well but nonetheless continues to have issues with pressure and friction to her foot whenever she is not wearing the cast. Again today I think the wound is significantly improved and almost completely healed in 1 week's time after debridement  last week. Nonetheless this has continued to be an issue for that reason we did do a MRI with contrast which really revealed no evidence whatsoever of an issue here. Again this is great but nonetheless still offers up the question what regularly to keep this from continuing to just reopen every time that we see. Again I really do not know exactly what to do is I feel like this falls on the ground outside of wound care more into a issue more specifically with the orthopedic side of things. I understand the concern that a brace would probably be problematic for the patient. That makes complete sense to be honest. It would probably cause rubbing on other areas which would not be good. With that being said also I am not sure what else we can do other than just keeping her in the cast which is really not a feasible option either. Every time I take her out of the cast this reopens almost immediately. 01/22/2021 upon evaluation today patient appears to be  doing well with regard to her foot ulcer. This is going to require little bit of sharp debridement today. Fortunately there does not appear to be any signs of active infection at this time. No fevers, chills, nausea, vomiting, or diarrhea. I did previously last week tell the patient that I would contact Dr. Nona Dell office I did do so I spoke with Larkin Ina who is the physician assistant for Dr. Doran Durand. Subsequently he did relate to me that they really did not feel like there was anything that could be done differently for the patient. That is from an orthopedic standpoint. He said that the patient could make an appoint with Dr. Doran Durand to discuss further but he was not really sure that would make a big difference in the treatment plan to be honest. Subsequently he further did recommend that potentially the patient after I offered a thought on the potential for a Crow boot could benefit from this since she seems to do well with a total contact cast. With that being said he did advise that [place] would probably be the way to go for that if need be. 01/29/2021 upon evaluation today patient appears to be doing well with regard to her foot ulcer. In fact this pretty much appears to be healed. With that being said of course I am reluctant to absolutely call it healed and just discharge her due to things we have had going on in the past. I think we still need to monitor and watch out for this for that reason I am going to ensure nothing is hiding or potentially get a reopen honest by bringing her back next week after using some foam over the area today. 02/05/2021 on evaluation today patient's wound actually appears to be completely closed. I do not see anything open on anything even threatened open. With that being said she did have drainage on her dressing it may appear that she had a small blister that occurred, rupture, and has resolved. Overall I do not see anything else concerning at this point. 02/12/2021  upon evaluation today patient actually still continues to maintain closure as far as her wound is concerned this is actually long as she has been without anything reopening. She has been extremely careful with that however. At this point I think she does need to start slowly and carefully increasing her activity with her shoes wants to get checked tomorrow. She has her appointment with Dr. Prudy Feeler next Wednesday and the  following Wednesday with Dr. Doran Durand. 03/12/2021 upon evaluation today patient appears to be doing a little worse obviously and the fact that she has been healed for a bit of time and then subsequently reopened in the past week. We got her scheduled for a repeat appointment here to try to get things back under control. Fortunately there does not appear to be any evidence of active infection. Unfortunately I do believe we need to clear away some of the callus and necrotic debris. 03/19/2021 upon evaluation today patient appears to be doing decently well in regard to her foot ulcer to be honest. Fortunately there does not appear to be any signs of active infection which is great news. No fevers, chills, nausea, vomiting, or diarrhea. Overall I am extremely pleased with where things are progressing. She did see the specialist at [facility] clinic in order to see about new shoes. With that being said they recommended at this point that the best option would probably be for the patient to continue along with the current shoe although he did make some modifications to it and apparently he thinks that this would likely be quite well for her. I did check out the modifications made and it seems like it is can to keep pressure off decently well in my opinion. Overall I am pretty pleased with her experience there that he was not willing to just go for another 600. Shoes that insurance likely would not pay for when she has appropriately good pair currently that he can modify. 03/26/2021 upon evaluation today  patient's wound unfortunately is doing about the same. I am really not seeing signs of improvement which is not what we were looking for. We were hoping that she would be doing better this week. Nonetheless I think putting her back in a total contact cast is probably inevitable at this point. 04/02/2021 upon evaluation today patient appears to be doing well with regard to her wound compared to even last week. Fortunately there is no signs of active infection and overall I am actually extremely pleased with where things stand this is a least half the size it was last week. Overall I think she is very close to complete resolution. My goal would be to see whether or not we can get this closed in the next week so she will be able to eat the following week have nothing but her diabetic shoe and see if that sufficient to keep her wound protected enough to get it to heal. Overall I am extremely happy in general with where things stand. Objective Constitutional Well-nourished and well-hydrated in no acute distress. Vitals Time Taken: 1:44 PM, Temperature: 98.6 F, Pulse: 86 bpm, Respiratory Rate: 18 breaths/min, Blood Pressure: 153/81 mmHg. Respiratory normal breathing without difficulty. Psychiatric this patient is able to make decisions and demonstrates good insight into disease process. Alert and Oriented x 3. pleasant and cooperative. General Notes: Upon inspection patient's wound bed showed signs of good granulation epithelization at this point. Fortunately there does not appear to be any signs of active infection which is great news and overall I am extremely pleased with where things stand today. Integumentary (Hair, Skin) Wound #1RRRR status is Open. Original cause of wound was Gradually Appeared. The date acquired was: 05/11/2019. The wound has been in treatment 63 weeks. The wound is located on the [place]. The wound measures 0.2cm length x 0.2cm width x 0.2cm depth; 0.031cm^2  area and 0.006cm^3 volume. There is Fat Layer (Subcutaneous Tissue) exposed. There is  no tunneling or undermining noted. There is a small amount of serous drainage noted. The wound margin is distinct with the outline attached to the wound base. There is large (67-100%) pink, pale granulation within the wound bed. There is no necrotic tissue within the wound bed. Assessment Active Problems ICD-10 Type 2 diabetes mellitus with foot ulcer Non-pressure chronic ulcer of other part of left foot with fat layer exposed Charcot's joint, left ankle and foot Type 2 diabetes mellitus with diabetic neuropathy, unspecified Procedures Wound #1RRRR Pre-procedure diagnosis of Wound #1RRRR is a Diabetic Wound/Ulcer of the Lower Extremity located on the Left,Medial,Plantar Foot . There was a Total Contact Cast Procedure by Worthy Keeler, PA. Post procedure Diagnosis Wound #1RRRR: Same as Pre-Procedure Plan Follow-up Appointments: Return Appointment in 1 week. - ****EXTRA TIME FOR TCC**** 60 minutes Bathing/ Shower/ Hygiene: May shower with protection but do not get wound dressing(s) wet. Off-Loading: T Contact Cast to Left Lower Extremity otal Other: - custom diabetic shoes Additional Orders / Instructions: Follow Nutritious Diet WOUND #1RRRR: - Foot Wound Laterality: Plantar, Left, Medial Prim Dressing: KerraCel Ag Gelling Fiber Dressing, 2x2 in (silver alginate) 1 x Per Week/7 Days ary Discharge Instructions: Apply silver alginate to wound bed as instructed Secondary Dressing: ComfortFoam Border, 3x3 in (silicone border) 1 x Per Week/7 Days Discharge Instructions: or large bandaide. Apply over primary dressing as directed. 1. Would recommend currently that we going to continue with wound care measures as before specifically with a little bit of silver alginate just to keep the area nice and dry and allow it to hopefully close up my hope is by next week. 2. I am also can recommend that we continue  with a total contact cast. I think this is doing a great job. 3. I am also can recommend the patient continue to monitor for any signs of worsening infection. Obviously if anything changes she should let me know. This will be something such as increased pain. We will see patient back for reevaluation in 1 week here in the clinic. If anything worsens or changes patient will contact our office for additional recommendations. Electronic Signature(s) Signed: 04/02/2021 2:29:51 PM By: Worthy Keeler PA-C Entered By: Worthy Keeler on 04/02/2021 14:29:51 -------------------------------------------------------------------------------- Total Contact Cast Details Patient Name: Date of Service: [name] [name], [name] E. 04/02/2021 1:30 PM Medical Record Number: [id number] Patient Account Number: [id number] Date of Birth/Sex: Treating RN: 08-01-1946 (75 y.o. [name] [name] Primary Care Provider: [name] Other Clinician: Referring Provider: Treating Provider/Extender: Adele Schilder in Treatment: 12 T Contact Cast Applied for Wound Assessment: otal Wound #1RRRR Left,Medial,Plantar Foot Performed By: Physician Worthy Keeler, PA Post Procedure Diagnosis Same as Pre-procedure Electronic Signature(s) Signed: 04/02/2021 5:35:19 PM By: [name] RN, BSN Signed: 04/02/2021 6:37:42 PM By: Worthy Keeler PA-C Entered By: [name] on 04/02/2021 14:19:16 -------------------------------------------------------------------------------- SuperBill Details Patient Name: Date of Service: [name] Guarneri RBA RA E. 04/02/2021 Medical Record Number: [id number] Patient Account Number: [id number] Date of Birth/Sex: Treating RN: 03/17/1946 (75 y.o. [name] [name] Primary Care Provider: [name] Other Clinician: Referring Provider: Treating Provider/Extender: Adele Schilder in Treatment: 12 Diagnosis Coding ICD-10 Codes Code Description E11.621 Type 2  diabetes mellitus with foot ulcer L97.522 Non-pressure chronic ulcer of other part of left foot with fat layer exposed M14.672 Charcot's joint, left ankle and foot E11.40 Type 2 diabetes mellitus with diabetic neuropathy, unspecified Facility Procedures CPT4 Code: [id number] Description: [id number] - APPLY  TOTAL CONTACT LEG CAST ICD-10 Diagnosis Description L97.522 Non-pressure chronic ulcer of other part of left foot with fat layer exposed Modifier: Quantity: 1 Physician Procedures : CPT4 Code Description Modifier [id number] - WC PHYS APPLY TOTAL CONTACT CAST ICD-10 Diagnosis Description L97.522 Non-pressure chronic ulcer of other part of left foot with fat layer exposed Quantity: 1 Electronic Signature(s) Signed: 04/02/2021 2:30:44 PM By: Worthy Keeler PA-C Entered By: Worthy Keeler on 04/02/2021 14:30:43

## 2021-04-03 DIAGNOSIS — L4059 Other psoriatic arthropathy: Secondary | ICD-10-CM | POA: Diagnosis not present

## 2021-04-03 NOTE — Progress Notes (Signed)
[name], [name] ([id number]) Visit Report for 04/02/2021 Arrival Information Details Patient Name: Date of Service: [name] [name], [name] E. 04/02/2021 1:30 PM Medical Record Number: [id number] Patient Account Number: [id number] Date of Birth/Sex: Treating RN: 29-May-1946 (75 y.o. [name] [name], [name] Primary Care Agripina Guyette: [name] Other Clinician: Referring Wacey Zieger: Treating Antwane Grose/Extender: Adele Schilder in Treatment: 12 Visit Information History Since Last Visit Added or deleted any medications: No Patient Arrived: Ambulatory Any new allergies or adverse reactions: No Arrival Time: 13:42 Had a fall or experienced change in No Accompanied By: self activities of daily living that may affect Transfer Assistance: None risk of falls: Patient Identification Verified: Yes Signs or symptoms of abuse/neglect since last visito No Secondary Verification Process Completed: Yes Hospitalized since last visit: No Patient Requires Transmission-Based Precautions: No Implantable device outside of the clinic excluding No Patient Has Alerts: No cellular tissue based products placed in the center since last visit: Has Dressing in Place as Prescribed: Yes Pain Present Now: No Electronic Signature(s) Signed: 04/03/2021 7:44:17 AM By: Sandre Kitty Entered By: Sandre Kitty on 04/02/2021 13:43:59 -------------------------------------------------------------------------------- Encounter Discharge Information Details Patient Name: Date of Service: [name] [name] RBA RA E. 04/02/2021 1:30 PM Medical Record Number: [id number] Patient Account Number: [id number] Date of Birth/Sex: Treating RN: Dec 16, 1945 (75 y.o. Elam [name] Primary Care [name] [name]: [name] Other Clinician: Referring [name] [name]: Treating [name] [name]/Extender: Adele Schilder in Treatment: 12 Encounter Discharge Information Items Discharge Condition: Stable Ambulatory Status:  Walker Discharge Destination: Home Transportation: Private Auto Accompanied By: self Schedule Follow-up Appointment: Yes Clinical Summary of Care: Patient Declined Electronic Signature(s) Signed: 04/02/2021 5:35:19 PM By: [name] RN, BSN Entered By: [name] on 04/02/2021 14:34:42 -------------------------------------------------------------------------------- Lower Extremity Assessment Details Patient Name: Date of Service: [name], [name] [name] 04/02/2021 1:30 PM Medical Record Number: [id number] Patient Account Number: [id number] Date of Birth/Sex: Treating RN: 06/05/1946 (75 y.o. Elam [name] Primary Care [name] [name]: [name] Other Clinician: Referring [name] [name]: Treating [name] [name]/Extender: [name] [name] in Treatment: 12 Edema Assessment Assessed: [Left: No] [Right: No] Edema: [Left: N] [Right: o] Calf Left: Right: Point of Measurement: 30 cm From Medial Instep 31.5 cm Ankle Left: Right: Point of Measurement: 9 cm From Medial Instep 23.7 cm Vascular Assessment Pulses: Dorsalis Pedis Palpable: [Left:Yes] Electronic Signature(s) Signed: 04/02/2021 5:35:19 PM By: [name] RN, BSN Entered By: [name] on 04/02/2021 13:50:17 -------------------------------------------------------------------------------- Multi-Disciplinary Care Plan Details Patient Name: Date of Service: [name] Amato RA E. 04/02/2021 1:30 PM Medical Record Number: [id number] Patient Account Number: [id number] Date of Birth/Sex: Treating RN: Jan 23, 1946 (75 y.o. Elam [name] Primary Care Bret Vanessen: [name] Other Clinician: Referring [name] [name]: Treating [name] [name]/Extender: Adele Schilder in Treatment: [name] [name] reviewed with physician Active Inactive Wound/Skin Impairment Nursing Diagnoses: Impaired tissue integrity Knowledge deficit related to ulceration/compromised skin  integrity Goals: Patient/caregiver will verbalize understanding of skin care regimen Date Initiated: 01/08/2021 Target Resolution Date: 04/09/2021 Goal Status: Active Ulcer/skin breakdown will have a volume reduction of 30% by week 4 Date Initiated: 01/08/2021 Date Inactivated: 02/05/2021 Target Resolution Date: 02/05/2021 Goal Status: Met Ulcer/skin breakdown will have a volume reduction of 50% by week 8 Date Initiated: 02/05/2021 Date Inactivated: 02/12/2021 Target Resolution Date: 03/05/2021 Goal Status: Met Interventions: Assess patient/caregiver ability to obtain necessary supplies Assess patient/caregiver ability to perform ulcer/skin care regimen upon admission and as needed Provide education on ulcer and skin care Treatment Activities: Skin care regimen initiated : 01/08/2021  Topical wound management initiated : 01/08/2021 Notes: Electronic Signature(s) Signed: 04/02/2021 5:35:19 PM By: [name] RN, BSN Entered By: [name] on 04/02/2021 13:51:41 -------------------------------------------------------------------------------- Pain Assessment Details Patient Name: Date of Service: [name] [name] RBA RA E. 04/02/2021 1:30 PM Medical Record Number: [id number] Patient Account Number: [id number] Date of Birth/Sex: Treating RN: 09/15/1945 (75 y.o. Elam [name] Primary Care Shellye Zandi: [name] Other Clinician: Referring Tayelor Osborne: Treating Bryce Cheever/Extender: Adele Schilder in Treatment: 12 Active Problems Location of Pain Severity and Description of Pain Patient Has Paino No Site Locations Pain Management and Medication Current Pain Management: Electronic Signature(s) Signed: 04/02/2021 5:35:19 PM By: [name] RN, BSN Signed: 04/03/2021 7:44:17 AM By: Sandre Kitty Entered By: Sandre Kitty on 04/02/2021 13:44:22 -------------------------------------------------------------------------------- Patient/Caregiver Education  Details Patient Name: Date of Service: [name] [name] 8/24/2022andnbsp1:30 PM Medical Record Number: [id number] Patient Account Number: [id number] Date of Birth/Gender: Treating RN: [birth year] (75 y.o. Elam [name] Primary Care Physician: [name] Other Clinician: Referring Physician: Treating Physician/Extender: Adele Schilder in Treatment: 12 Education Assessment Education Provided To: Patient Education Topics Provided Offloading: Methods: Explain/Verbal Responses: Reinforcements needed, State content correctly Wound/Skin Impairment: Methods: Explain/Verbal Responses: Reinforcements needed, State content correctly Electronic Signature(s) Signed: 04/02/2021 5:35:19 PM By: [name] RN, BSN Entered By: [name] on 04/02/2021 13:52:20 -------------------------------------------------------------------------------- Wound Assessment Details Patient Name: Date of Service: [name] Amato RA E. 04/02/2021 1:30 PM Medical Record Number: [id number] Patient Account Number: [id number] Date of Birth/Sex: Treating RN: 31-Aug-1945 (75 y.o. Elam [name] Primary Care Senai Kingsley: [name] Other Clinician: Referring Adalae Baysinger: Treating Josiyah Tozzi/Extender: [name] [name] in Treatment: 12 Wound Status Wound Number: [id number] Primary Diabetic Wound/Ulcer of the Lower Extremity Etiology: Wound Location: Left, Medial, Plantar Foot Wound Open Wounding Event: Gradually Appeared Status: Date Acquired: 05/11/2019 Comorbid Sleep Apnea, Deep Vein Thrombosis, Hypertension, Colitis, Type [name] Of Treatment: 63 History: II Diabetes, Gout, Osteoarthritis, Neuropathy Clustered Wound: No Photos Wound Measurements Length: (cm) 0.2 Width: (cm) 0.2 Depth: (cm) 0.2 Area: (cm) 0.031 Volume: (cm) 0.006 % Reduction in Area: 97% % Reduction in Volume: 98.5% Epithelialization: Medium (34-66%) Tunneling: No Undermining: No Wound  Description Classification: Grade 1 Wound Margin: Distinct, outline attached Exudate Amount: Small Exudate Type: Serous Exudate Color: amber Foul Odor After Cleansing: No Slough/Fibrino No Wound Bed Granulation Amount: Large (67-100%) Exposed Structure Granulation Quality: Pink, Pale Fascia Exposed: No Necrotic Amount: None Present (0%) Fat Layer (Subcutaneous Tissue) Exposed: Yes Tendon Exposed: No Muscle Exposed: No Joint Exposed: No Bone Exposed: No Treatment Notes Wound #[id number] (Foot) Wound Laterality: Plantar, Left, Medial Cleanser Peri-Wound Care Topical Primary Dressing KerraCel Ag Gelling Fiber Dressing, 2x2 in (silver alginate) Discharge Instruction: Apply silver alginate to wound bed as instructed Secondary Dressing ComfortFoam Border, 3x3 in (silicone border) Discharge Instruction: or large bandaide. Apply over primary dressing as directed. Secured With Compression Wrap Compression Stockings Add-Ons Notes T[facility] Electronic Signature(s) Signed: 04/02/2021 5:35:19 PM By: [name] RN, BSN Signed: 04/03/2021 7:44:17 AM By: Sandre Kitty Entered By: Sandre Kitty on 04/02/2021 13:52:22 -------------------------------------------------------------------------------- Vitals Details Patient Name: Date of Service: [name] [name], [name] RA E. 04/02/2021 1:30 PM Medical Record Number: [id number] Patient Account Number: [id number] Date of Birth/Sex: Treating RN: [birth year] (75 y.o. Elam [name] Primary Care Genieve Ramaswamy: [name] Other Clinician: Referring Malarie Tappen: Treating Rahil Passey/Extender: Adele Schilder in Treatment: 12 Vital Signs Time Taken: 13:44 Temperature (F): 98.6 Pulse (bpm): 86 Respiratory Rate (breaths/min): 18 Blood Pressure (mmHg): 153/81  Reference Range: 80 - 120 mg / dl Electronic Signature(s) Signed: 04/03/2021 7:44:17 AM By: Sandre Kitty Entered By: Sandre Kitty on 04/02/2021 13:44:16

## 2021-04-07 ENCOUNTER — Ambulatory Visit (INDEPENDENT_AMBULATORY_CARE_PROVIDER_SITE_OTHER): Payer: Medicare Other | Admitting: Cardiovascular Disease

## 2021-04-07 ENCOUNTER — Other Ambulatory Visit: Payer: Self-pay

## 2021-04-07 DIAGNOSIS — I4510 Unspecified right bundle-branch block: Secondary | ICD-10-CM | POA: Diagnosis not present

## 2021-04-07 DIAGNOSIS — G4733 Obstructive sleep apnea (adult) (pediatric): Secondary | ICD-10-CM

## 2021-04-07 DIAGNOSIS — I10 Essential (primary) hypertension: Secondary | ICD-10-CM | POA: Diagnosis not present

## 2021-04-07 DIAGNOSIS — L97529 Non-pressure chronic ulcer of other part of left foot with unspecified severity: Secondary | ICD-10-CM | POA: Diagnosis not present

## 2021-04-07 DIAGNOSIS — E118 Type 2 diabetes mellitus with unspecified complications: Secondary | ICD-10-CM | POA: Diagnosis not present

## 2021-04-07 DIAGNOSIS — E785 Hyperlipidemia, unspecified: Secondary | ICD-10-CM | POA: Diagnosis not present

## 2021-04-07 NOTE — Patient Instructions (Signed)
Medication Instructions:  Continue current medications  *If you need a refill on your cardiac medications before your next appointment, please call your pharmacy*   Lab Work: None Ordered   Testing/Procedures: None Ordered   Follow-Up: At [facility], you and your health needs are our priority.  As part of our continuing mission to provide you with exceptional heart care, we have created designated Provider Care Teams.  These Care Teams include your primary Cardiologist (physician) and Advanced Practice Providers (APPs -  Physician Assistants and Nurse Practitioners) who all work together to provide you with the care you need, when you need it.  We recommend signing up for the patient portal called "MyChart".  Sign up information is provided on this After Visit Summary.  MyChart is used to connect with patients for Virtual Visits (Telemedicine).  Patients are able to view lab/test results, encounter notes, upcoming appointments, etc.  Non-urgent messages can be sent to your provider as well.   To learn more about what you can do with MyChart, go to [email or web address].com.    Your next appointment:   6 month(s)  The format for your next appointment:   In Person  Provider:   You may see Jelisha Weed Kelly, MD or one of the following Advanced Practice Providers on your designated Care Team:    Hao Meng, PA-C  Angela Duke, PA-C or   Krista Kroeger, PA-C     

## 2021-04-07 NOTE — Progress Notes (Signed)
Patient ID: [name] [name], female   DOB: 11/17/1945, 75 y.o.   MRN: [id number]     HPI: [name] [name] is a 75 y.o. female percents in the office today for a followup sleep/cardiology evaluation.She was last evaluated in a telemedicine visit in October 2021.  [name] [name] is a retired [employer]  who has a history of hypertension, obesity, psoriatic arthritis, DVT, which occurred while traveling to [place], as well as complex obstructive sleep apnea.  She has had difficulty in the past with [name] [name] therapy. She has been able to tolerate [name] [name] and has had a [employer] [name] [name] Auto unit well but also has had difficulty with some of her high pressure requirements. In April 2014 I changed her maximum [name] [name] pressure to 19 and her maximum EPAP pressure to 15. I also reduced her minimum EPAP pressure to 6 and her minimal IPAP pressure to 10 with pressure support of 4 and changed her Bi_Flex to 3. She has been followed by [name] [name].  Clinically, she had felt markedly improved with [name] [name].  Compared to prior [name] [name] therapy.  She has psoriatic arthritis and is on [name] [name] as well as [name] [name] followed by rheumatology.  She also has a [name] [name] joint in her right foot.   She is on [name] [name] for pain relief relative to this. She does not routinely exercise.  She keeps busy moving around, but typically does not do aerobic activity.  She has not been successful with significant weight loss.   She recently, she has been taking lisinopril HCTZ 20/12.5 mg for hypertension.  She had experienced an episode of chest discomfort which she felt was like a cramp and she noticed this most when standing up, but was associated with diaphoresis and shortness of breath.  She does have family history for coronary artery disease both in her mother as well as in her maternal grandmother. She has not been able to be very active due to her [name] [name] foot.  She also had noticed some mild shortness of breath with activity.  She eventually was referred  for a nuclear perfusion study on 01/31/2015 which revealed normal perfusion and function; ejection fraction 64%.  When I saw her in February 2017, her [name] [name] machine had begun to fail.  She continues to use [name] [name] with 100% compliance and cannot sleep without it.  She typically sleeps for at least 8 hours per night.  She had taken her [name] [name] machine to [name] [name] who stated that her machine essentially had stopped working and is in need for new machine.   She received a new [employer] [name] [name] unit in April 2017.  She uses a [name] [name], medium size.  She feels that the machine has been working well but she has had difficulty with her sleep.  At times she feels that she is not getting enough pressure.  Previously she had been set on a [name] [name] auto unit with an EPAP minimum of 8 and IPAP max of 19 , which had worked well.  I obtained a new download today from 02/16/2016 through 03/16/2016.  She is 100% compliant and is averaging 8 hours and 25 minutes of sleep.  Apparently, she is not been set on auto and has been on and IPAP of 14 and an EPAP of 10.  Her AHI is increased at 25.1. I changed her [name] [name] mode from a fixed pressure to auto [name] [name] initiating atenolol over 6 with potential titration up to 20/16.  I also allowed for pressure support  to change from 4-6 as needed.  IN 2018, she felt well from a cardiac perspective, but has had significant infections including [name] [name], [name] [name], [name] [name], and she had fractured her right ankle.  As result, she has noticed some ankle swelling right greater than left.  She continues to use her [name] [name] and believes she is feeling much better than she had in the past.  [name] [name] is her DME company.  A download in the office from 01/26/2017 through 04/25/2017.  She is 100% compliance.  She is averaging 8 hours and 36 minutes of sleep per night.  Her average device IPAP pressure was 18.2 and average device [name] [name] pressure greater than 90% of the time was 14.1.  AHI  continued to be elevated at 21.2. An Epworth scale score was calculated in the office today and this endorsed at 4, arguing against daytime sleepiness.  I last saw her, she was sleeping adequate duration.  Her AHI remains elevated I suggested that she reduce ramp time from 30 minutes down to 20 minutes and increased her starting pressure from 4 up to 6 cm.  I saw her in September 2019 at which time she had again started to notice  symptoms where she was waking up at night.  She noted leaks from her [name] at higher pressures.  She feels that she is more  tired.  She had some swelling right lower extremity greater than left and she has a [name] [name] right foot.  She had broken her right ankle in August 2018.  She denied any chest pain.  She is unaware of any cardiac arrhythmias.  Her daughter, [name] [name] is a GI physician who had been practicing in [facility] and will be moving up to work with [employer].  She is grateful that she will now have her twin granddaughters close by.  She was recently placed on [name] [name] for her psoriatic arthritis to take in addition to her [name] [name].  She is followed by Dr. Gavin [name] for her rheumatologic issues.   When I evaluated her in December 2019 she wassleeping 8 hours 29 minutes and her average IPAP pressure greater than 90% 16.7 with an average EPAP pressure of 12.4.  I reduced her ramp time down to 20 minutes.  Having issues with her [name] and I recommended a trial of the new [name] [name] full facemask by either a [employer] or a [facility].  Over the past several months, she has not been able to be active due to her fracture of her fifth metatarsal.  She was in a hard cast for 2 months with no weightbearing.  As result she has not been able to exercise.  She has gained weight.  Was just removed several weeks ago.  She admits to ankle swelling.  She continues to use her [name] [name].  A new download was obtained and she again is compliant.  Her average device IPAP  pressure greater than 90% of the time was 18.1 with a maximum titrated IPAP pressure of 20.  AHI remained elevated at 17.9.    She went to [place] in January 2020 and unfortunately became ill and spent the entire trip in the hospital due to ischemic colitis.  She was subsequently evaluated by Fabian Sharp in a telemedicine visit in June 2020.  When I last evaluated her in a telemedicine visit in June 2021 she had not been able to do much walking because of tenderness from her bone on the plantar aspect of her foot.  She  has fallen arches.  She is being evaluated by Dr. Doran Durand.  Around Thanksgiving 2020 both she and her husband both became ill with COVID-19 pneumonia and both were hospitalized for approximately a week.  She had temperatures of 103, she was treated with remdesivir, convalescent plasma in addition to steroids.  She has subsequently not regained her sense of taste or smell and has continued to experience fatigability.  During that time she was not able to use [name] [name].  Apparently she was having issues stating that Medicare is going to deny her [name] [name].  She has continued to use [name] [name] therapy.  I attempted to try to get a download from her [employer] website today but we were unable to do this in the office.  I have reached out to Barry Brunner who will contact [employer] to see if we can obtain her most recent download.  [name] [name] continues to be on lisinopril/HCTZ for hypertension, rosuvastatin 5 mg for hyperlipidemia.  She was on [name] [name] for rheumatoid arthritis but this was stopped approximately 6 weeks ago.  She is diabetic on metformin.     I scheduled her for a 2D echo Doppler study to reassess LV function following her Covid infection which was done on February 06, 2020.  LV function was excellent with EF of 65 to 70%.  There was grade 1 diastolic dysfunction.  There was mild mitral regurgitation.   I evaluated her in a telemedicine visit on May 24, 2020.  Since her last evaluation, her  [employer] [name] station has been on recall.  She has registered with [employer] and attempt to get a new machine.  She had called the office shortly thereafter wondering if she should still use therapy.  At that time with the severity of her sleep apnea and the incidence of only 0.3% of devices with problems I had recommended she continue therapy.  She uses [name] [name] as her DME company.  I asked her today if she had been contacted regarding insertion of a bacteria inlet filter device which she could place into her tubing but apparently this had not been done by [name] [name].  She feels well.  She again has been immobile and that her left leg is in a hard cast making it difficult to walk.  She does note some occasional ankle swelling.  She was wondering if she should continue with aspirin which she has been taking 81 mg 3 times a week.  She admits to being under increased stress since her husband unfortunately had a brain bleed but is starting to improve.  She has been on lisinopril HCT 20/25 mg.  She has continued to be on lipid-lowering therapy with rosuvastatin 5 mg and LDL cholesterol was 40.   Since her last evaluation, she states that she became sick in November 2021 with an intestinal virus, she required ICU she tells me she was cared for by Dr. Cristina Gong.  He apparently had an extensive hospital stay.  She has had difficulty with long COVID resulting from her COVID infection in November 2020.  She recently has been under months of extensive care for her left foot ulcers requiring wound care visits for her previous infections which has resolved.  From a sleep perspective, she has had difficulty and recently adjustments were made to her [name] [name] therapy.  Her device is on recall and she is not been aware of any timeframe when potentially she can receive a new device.  Due to continued difficulty with her unit, approximately 1  month ago her pressures her pressures were adjusted such that now her  ramp time is set at 20 minutes, ramp start pressure 6, minimum EPAP 14, maximum IPAP 25, with pressure support range of 3 to 8 cm of water.  Her most recent download shows 100% usage with average use at 9 hours and 49 minutes per night.  Her 90% pressures are 22.3/19.0 with maximum titrated pressures at 25/23.  Despite this average AHI continues to be elevated at 16.3.  She presents for evaluation.   Past Medical History:  Diagnosis Date   Arthritis    Closed fracture of fifth metatarsal bone 05/13/2018   Deep vein thrombosis (HCC)    right calf - 05/2012    Diabetes mellitus without complication (HCC)    diet controlled    GERD (gastroesophageal reflux disease)    Hyperlipidemia    Hypertension    Ejection fraction =>55% Left ventricular systolic function is normal. Left ventricular wall motion is normal     Lymphocytic colitis    Neuropathy    diabetic - in bilateral feet    Pityriasis lichenoides chronica    Sleep apnea    [name] [name]    Past Surgical History:  Procedure Laterality Date   DILATION AND CURETTAGE OF UTERUS     EYE SURGERY  07/2020   HAMMER TOE SURGERY     right hand surgery      due to blood infection    torn meniscus repair      right knee    TOTAL HIP ARTHROPLASTY  09/13/2012   Procedure: TOTAL HIP ARTHROPLASTY ANTERIOR APPROACH;  Surgeon: Mauri Pole, MD;  Location: [facility];  Service: Orthopedics;  Laterality: Right;    Allergies  Allergen Reactions   Other Shortness Of Breath    Blue fish: palms and feet turn red   Bee Venom Swelling   Farxiga [Dapagliflozin]     Recurrent yeast infections   Semaglutide     Rybelsus - lip swelling, GI upset   Sulfa Antibiotics Hives    Current Outpatient Medications  Medication Sig Dispense Refill   acetaminophen (TYLENOL) 325 MG tablet Take 2 tablets (650 mg total) by mouth every 6 (six) hours as needed for mild pain or headache.     Alcohol Swabs PADS UAD to check sugars.  E11.65 100 each 12   allopurinol  (ZYLOPRIM) 100 MG tablet Take 1 tablet (100 mg total) by mouth 2 (two) times daily. 180 tablet 3   ALPRAZolam (XANAX) 0.25 MG tablet TAKE 1 TABLET(0.25 MG) BY MOUTH TWICE DAILY AS NEEDED FOR ANXIETY 20 tablet 0   ascorbic acid (VITAMIN C) 500 MG tablet Take 500 mg by mouth at bedtime.     aspirin 81 MG tablet Take 81 mg by mouth See admin instructions. Every 3 days     Budesonide 9 MG TB24 Take 9 mg by mouth daily.      Certolizumab Pegol ([name] [name] [place]) Inject 400 mg into the skin every 30 (thirty) days. 200 mg on each side of stomach     Cholecalciferol (VITAMIN D3) 1000 UNITS CAPS Take 1,000 Units by mouth 2 (two) times daily.      Cyanocobalamin (VITAMIN B-12) 1000 MCG SUBL Place 1,000 mcg under the tongue daily.     cyclobenzaprine (FLEXERIL) 10 MG tablet TAKE 1 TABLET BY MOUTH AT BEDTIME 90 tablet 1   folic acid (FOLVITE) 1 MG tablet TAKE 1 TABLET(1 MG) BY MOUTH DAILY (Patient taking differently: Take 1 mg by  mouth daily.) 90 tablet 3   furosemide (LASIX) 20 MG tablet TAKE 1 TABLET(20 MG) BY MOUTH DAILY 5 tablet 0   glucose blood (GLUCOSE METER TEST) test strip Verio Flex One Touch.  Use as instructed. E11.9 100 each 12   Insulin Pen Needle (B-D UF III MINI PEN NEEDLES) 31G X 5 MM MISC Use daily for insulin pen 90 each 3   LEVEMIR FLEXTOUCH 100 UNIT/ML FlexTouch Pen Inject 20 Units into the skin 2 (two) times daily. 50 mL 3   potassium chloride (KLOR-CON) 10 MEQ tablet Take 3 tablets (30 mEq total) by mouth daily. 270 tablet 3   prednisoLONE acetate (PRED FORTE) 1 % ophthalmic suspension Place 4 drops into the right eye See admin instructions. 4 drops  daily for 1 week, 2 drops daily until post op. Surgery 07/11/20     Probiotic Product (ALIGN PO) Take 1 capsule by mouth daily.     rosuvastatin (CRESTOR) 5 MG tablet Take 1 tablet (5 mg total) by mouth daily. 90 tablet 3   sertraline (ZOLOFT) 50 MG tablet Take 1 tablet (50 mg total) by mouth daily. 30 tablet 5   STOOL SOFTENER 100 MG capsule TAKE  1 CAPSULE(100 MG) BY MOUTH TWICE DAILY 60 capsule 2   triamcinolone cream (KENALOG) 0.1 % Apply 1 application topically 2 (two) times daily. 30 g 0   azelastine (ASTELIN) 0.1 % nasal spray INSTILL 2 SPRAYS IN EACH NOSTRIL TWICE DAILY 30 mL 12   carvedilol (COREG) 6.25 MG tablet TAKE 1 TABLET(6.25 MG) BY MOUTH TWICE DAILY WITH A MEAL 60 tablet 3   lisinopril-hydrochlorothiazide (ZESTORETIC) 20-25 MG tablet TAKE 1 TABLET BY MOUTH DAILY 90 tablet 1   loperamide (IMODIUM) 2 MG capsule Take 6 mg by mouth 2 (two) times daily.     Melatonin 5 MG SUBL Place 5 mg under the tongue at bedtime.     metFORMIN (GLUCOPHAGE-XR) 500 MG 24 hr tablet Take 1 tablet (500 mg total) by mouth 2 (two) times daily before a meal.     [name] [name] (RHEUMATREX) 2.5 MG tablet Take 20 mg by mouth every Friday.      naftifine (NAFTIN) 1 % cream Apply 1 application topically daily as needed (rash).      omeprazole (PRILOSEC) 40 MG capsule TAKE 1 CAPSULE BY MOUTH EVERY DAY 90 capsule 1   No current facility-administered medications for this visit.    Socially she is widowed. She has 2 children and 2 grandchildren. Her daughter is a Engineer, drilling. There is no tobacco use. She does drink occasional alcohol.  ROS General: Negative; No fevers, chills, or night sweats;  HEENT: Negative; No changes in vision or hearing, sinus congestion, difficulty swallowing Pulmonary: Negative; No cough, wheezing, shortness of breath, hemoptysis Cardiovascular: Negative; No chest pain, presyncope, syncope, palpitations GI: History of lymphocytic colitis GU: Negative; No dysuria, hematuria, or difficulty voiding Musculoskeletal: Positive for R foot [name] [name] joint; recent right ankle fracture Hematologic/Oncology: Negative; no easy bruising, bleeding Rheumatologic: Psoriatic arthritis;  History of gout Endocrine: Negative; no heat/cold intolerance; no diabetes Neuro: Negative; no changes in balance, headaches Skin: History of psoriatic  arthritis Psychiatric: Negative; No behavioral problems, depression Sleep: Positive for complex sleep apnea on [name] [name] therapy;  No residual snoring, daytime sleepiness, hypersomnolence, bruxism, restless legs, hypnogognic hallucinations, no cataplexy Other comprehensive 14 point system review is negative.  PE BP (!) 159/84   Pulse 75   Ht $R'5\' 6"'MH$  (1.676 m)   Wt 249 lb (112.9 kg)   SpO2  98%   BMI 40.19 kg/m    Repeat blood pressure by me was 128/66  Wt Readings from Last 3 Encounters:  04/07/21 249 lb (112.9 kg)  03/12/20 245 lb (111.1 kg)  01/12/20 245 lb (111.1 kg)    General: Alert, oriented, no distress.  Currently in a wheelchair. Skin: normal turgor, no rashes, warm and dry HEENT: Normocephalic, atraumatic. Pupils equal round and reactive to light; sclera anicteric; extraocular muscles intact;  Nose without nasal septal hypertrophy Mouth/Parynx benign; Mallinpatti scale 3 Neck: No JVD, no carotid bruits; normal carotid upstroke Lungs: clear to ausculatation and percussion; no wheezing or rales Chest wall: without tenderness to palpitation Heart: PMI not displaced, RRR, s1 s2 normal, 1/6 systolic murmur, no diastolic murmur, no rubs, gallops, thrills, or heaves Abdomen: soft, nontender; no hepatosplenomehaly, BS+; abdominal aorta nontender and not dilated by palpation. Back: no CVA tenderness Pulses 2+ Musculoskeletal: full range of motion, normal strength, no joint deformities Extremities: Left foot in boot.  [name] [name] joints.  No clubbing cyanosis , Homan's sign negative  Neurologic: grossly nonfocal; Cranial nerves grossly wnl Psychologic: Normal mood and affect   ECG (independently read by me): NSR at 75, RBBB, no ectopy  December 2019 ECG (independently read by me): Sinus rhythm at 78 bpm.  Mild sinus arrhythmia.  Right bundle branch block with repolarization changes.  April 2019 ECG (independently read by me): Normal sinus rhythm at 83 bpm.  Right bundle branch block  with repolarization changes.  QTc interval 4 6 0 ms.  September 2018 ECG (independently read by me): normal sinus rhythm at 79 bpm.  Right bundle-branch block.  QTc interval 481 ms.  June 2016 ECG (independently read by me):  Normal sinus rhythm with right bundle branch block.  October 2015 ECG (independently read by me): Normal sinus rhythm at 71 beats per minute right bundle branch block with repolarization changes.  Normal intervals.  Prior July 2014 ECG sinus rhythm with incomplete right bundle branch block. QRS duration 118 ms. Nonspecific ST-T changes.  LABS: BMP Latest Ref Rng & Units 11/15/2020 07/23/2020 07/17/2020  Glucose 70 - 99 mg/dL 220(H) 125(H) 192(H)  BUN 6 - 23 mg/dL 12 16 24(H)  Creatinine 0.40 - 1.20 mg/dL 0.63 0.70 1.27(H)  BUN/Creat Ratio 12 - 28 - - -  Sodium 135 - 145 mEq/L 136 139 138  Potassium 3.5 - 5.1 mEq/L 3.3(L) 4.1 3.8  Chloride 96 - 112 mEq/L 96 103 106  CO2 19 - 32 mEq/L $Remove'31 28 24  'uOjHMnm$ Calcium 8.4 - 10.5 mg/dL 9.2 8.6 8.0(L)   Hepatic Function Latest Ref Rng & Units 11/15/2020 07/17/2020 07/16/2020  Total Protein 6.0 - 8.3 g/dL 5.8(L) 4.7(L) 5.0(L)  Albumin 3.5 - 5.2 g/dL 3.2(L) 2.4(L) 2.4(L)  AST 0 - 37 U/L 33 26 22  ALT 0 - 35 U/L 45(H) 22 27  Alk Phosphatase 39 - 117 U/L 49 38 39  Total Bilirubin 0.2 - 1.2 mg/dL 0.3 0.5 0.4  Bilirubin, Direct 0.0 - 0.2 mg/dL - - -   CBC Latest Ref Rng & Units 11/15/2020 07/17/2020 07/16/2020  WBC 4.0 - 10.5 K/uL 9.3 11.7(H) 13.3(H)  Hemoglobin 12.0 - 15.0 g/dL 10.5(L) 9.3(L) 10.0(L)  Hematocrit 36.0 - 46.0 % 31.7(L) 27.4(L) 29.5(L)  Platelets 150.0 - 400.0 K/uL 200.0 221 230   Lab Results  Component Value Date   MCV 89.8 11/15/2020   MCV 88.7 07/17/2020   MCV 87.3 07/16/2020   Lab Results  Component Value Date   TSH 3.249 07/16/2020  Lab Results  Component Value Date   HGBA1C 8.5 (H) 11/15/2020    Lipid Panel     Component Value Date/Time   CHOL 114 11/15/2020 1202   TRIG 143.0 11/15/2020 1202   HDL 44.30  11/15/2020 1202   CHOLHDL 3 11/15/2020 1202   VLDL 28.6 11/15/2020 1202   LDLCALC 41 11/15/2020 1202   LDLDIRECT 139.0 08/15/2019 1114      RADIOLOGY: No results found.  IMPRESSION:  1. OSA (obstructive sleep apnea)   2. Essential hypertension   3. Type 2 diabetes mellitus with complication, without long-term current use of insulin ([place])   4. Hyperlipidemia with target LDL less than 70   5. Morbid obesity ([place])   6. Right bundle branch block   7. Chronic ulcer of great toe of left foot, unspecified ulcer stage ([place])     ASSESSMENT AND PLAN: [name] [name] is a 75 year-old  female who has a history of hypertension, obesity, psoriatic arthritis, complex sleep apnea, as well as a remote history of DVT.    She sees Dr. Gavin [name] for rheumatology and Dr. Cristina Gong for her lymphocytic colitis.  She had broken her ankle over the past year and this has significantly limited her mobility and ability to walk and exercise.  Over the past year, she had significant difficulty requiring a longstanding hospitalization with GI issues.  In addition she has been plagued by a long COVID syndrome and her initial protracted COVID infection from November 2020.  She has been undergoing wound care for an infection in her left foot which has been an issue since October 2021 and had been seeing Dr. Doran Durand and Jeri Cos, PA-C at wound clinic with resolution of her prior infection.  Most recently she is being cared in [place] at [facility].  From a sleep perspective, she has had difficulty with her [employer] auto [name] [name] unit.  She has not yet heard from [employer] concerning potential new machine since these units have been recalled.  In the past I had discussed with her having a bacterial inlet filter to reduce the very rare risk brought up in the recall.  Over the last month adjustments have been made to her therapy.  Despite these adjustments she continues to have significant increased AHI and currently her 90%  pressures are 22.3/19 with maximum titrated pressure at 25/23.0.  Discussed with her the possibility of seeing if we can obtain a new [name] [name] auto unit and I will discuss this with her sleep coordinator who will contact [facility] which is her DME provider.  Presently, blood pressure was increased initially on presentation which did improve on reassessment by me at 140/70.  She is on lisinopril HCT 20/25 mg daily and has been taking furosemide 20 mg on an as-needed basis for leg swelling.  She is on carvedilol 6.25 mg twice a day.  Her ECG today shows sinus rhythm with right bundle branch block and no ectopy.  She is on rosuvastatin 5 mg for hyperlipidemia.  LDL cholesterol in April 2022 was 41.  She is diabetic on metformin and insulin.. She continues to take [name] [name] and [name] [name] ejections for her psoriatic arthritis.  She is followed by [name] for primary care.  I will see her in 6 months for reevaluation.  Troy Sine, MD, [facility]  04/14/2021 12:12 AM

## 2021-04-08 ENCOUNTER — Other Ambulatory Visit: Payer: Self-pay | Admitting: Internal Medicine

## 2021-04-08 NOTE — Progress Notes (Signed)
TOREE, EDLING ([id number]) Visit Report for 03/26/2021 Arrival Information Details Patient Name: Date of Service: [name] [name], LIPPERT RA E. 03/26/2021 3:30 PM Medical Record Number: [id number] Patient Account Number: [id number] Date of Birth/Sex: Treating RN: 1945-11-30 (75 y.o. F) Primary Care Jonell Brumbaugh: [name] Other Clinician: Referring Yani Coventry: Treating Eva Vallee/Extender: Adele Schilder in Treatment: 11 Visit Information History Since Last Visit Added or deleted any medications: No Patient Arrived: Wheel Chair Any new allergies or adverse reactions: No Arrival Time: 15:43 Had a fall or experienced change in No Accompanied By: self activities of daily living that may affect Transfer Assistance: None risk of falls: Patient Identification Verified: Yes Signs or symptoms of abuse/neglect since last visito No Secondary Verification Process Completed: Yes Hospitalized since last visit: No Patient Requires Transmission-Based Precautions: No Implantable device outside of the clinic excluding No Patient Has Alerts: No cellular tissue based products placed in the center since last visit: Has Dressing in Place as Prescribed: Yes Pain Present Now: No Electronic Signature(s) Signed: 04/08/2021 8:19:56 AM By: Sandre Kitty Entered By: Sandre Kitty on 03/26/2021 15:45:02 -------------------------------------------------------------------------------- Lower Extremity Assessment Details Patient Name: Date of Service: [name] [name], [name] 03/26/2021 3:30 PM Medical Record Number: [id number] Patient Account Number: [id number] Date of Birth/Sex: Treating RN: [birth year] (75 y.o. [name] [name] [name], [name] [name] Primary Care Lacorey Brusca: [name] Other Clinician: Referring Niam Nepomuceno: Treating Sedale Jenifer/Extender: Landis Martins Weeks in Treatment: 11 Edema Assessment Assessed: [Left: Yes] [Right: No] Edema: [Left: N] [Right: o] Calf Left: Right: Point of  Measurement: 30 cm From Medial Instep 31.4 cm Ankle Left: Right: Point of Measurement: 9 cm From Medial Instep 23.4 cm Vascular Assessment Pulses: Dorsalis Pedis Palpable: [Left:Yes] Posterior Tibial Palpable: [Left:Yes] Electronic Signature(s) Signed: 03/26/2021 5:20:51 PM By: Rhae Hammock RN Entered By: Rhae Hammock on 03/26/2021 15:57:33 -------------------------------------------------------------------------------- Multi-Disciplinary Care Plan Details Patient Name: Date of Service: [name] [name] [name] 03/26/2021 3:30 PM Medical Record Number: [id number] Patient Account Number: [id number] Date of Birth/Sex: Treating RN: 01-30-1946 (75 y.o. [name] [name] [name], [name] [name] Primary Care Izaih Kataoka: [name] Other Clinician: Referring Jobanny Mavis: Treating Antoine Fiallos/Extender: Adele Schilder in Treatment: [place] reviewed with physician Active Inactive Wound/Skin Impairment Nursing Diagnoses: Impaired tissue integrity Knowledge deficit related to ulceration/compromised skin integrity Goals: Patient/caregiver will verbalize understanding of skin care regimen Date Initiated: 01/08/2021 Target Resolution Date: 04/09/2021 Goal Status: Active Ulcer/skin breakdown will have a volume reduction of 30% by week 4 Date Initiated: 01/08/2021 Date Inactivated: 02/05/2021 Target Resolution Date: 02/05/2021 Goal Status: Met Ulcer/skin breakdown will have a volume reduction of 50% by week 8 Date Initiated: 02/05/2021 Date Inactivated: 02/12/2021 Target Resolution Date: 03/05/2021 Goal Status: Met Interventions: Assess patient/caregiver ability to obtain necessary supplies Assess patient/caregiver ability to perform ulcer/skin care regimen upon admission and as needed Provide education on ulcer and skin care Treatment Activities: Skin care regimen initiated : 01/08/2021 Topical wound management initiated : 01/08/2021 Notes: Electronic Signature(s) Signed:  03/26/2021 5:20:51 PM By: Rhae Hammock RN Entered By: Rhae Hammock on 03/26/2021 16:01:43 -------------------------------------------------------------------------------- Pain Assessment Details Patient Name: Date of Service: [name] [name] [name] 03/26/2021 3:30 PM Medical Record Number: [id number] Patient Account Number: [id number] Date of Birth/Sex: Treating RN: 1945/12/25 (75 y.o. F) Primary Care Keaira Whitehurst: [name] Other Clinician: Referring Yoshi Vicencio: Treating Lakevia Perris/Extender: Adele Schilder in Treatment: 11 Active Problems Location of Pain Severity and Description of Pain Patient Has Paino No Site Locations Pain Management and Medication Current Pain Management: Electronic  Signature(s) Signed: 04/08/2021 8:19:56 AM By: Sandre Kitty Entered By: Sandre Kitty on 03/26/2021 15:45:26 -------------------------------------------------------------------------------- Patient/Caregiver Education Details Patient Name: Date of Service: [name] [name] [name] 8/17/2022andnbsp3:30 PM Medical Record Number: [id number] Patient Account Number: [id number] Date of Birth/Gender: Treating RN: 22-Apr-1946 (75 y.o. [name] [name] [name] Primary Care Physician: [name] Other Clinician: Referring Physician: Treating Physician/Extender: Adele Schilder in Treatment: 11 Education Assessment Education Provided To: Patient Education Topics Provided Wound/Skin Impairment: Methods: Explain/Verbal Responses: State content correctly [employer]) Signed: 03/26/2021 5:20:51 PM By: Rhae Hammock RN Entered By: Rhae Hammock on 03/26/2021 16:01:55 -------------------------------------------------------------------------------- Wound Assessment Details Patient Name: Date of Service: [name] [name] [name] 03/26/2021 3:30 PM Medical Record Number: [id number] Patient Account Number: [id number] Date of Birth/Sex: Treating  RN: 1946-03-13 (75 y.o. [name] [name] [name], [name] [name] [name] Primary Care Jaslene Marsteller: [name] Other Clinician: Referring Sire Poet: Treating Jazelle Achey/Extender: Landis Martins Weeks in Treatment: 11 Wound Status Wound Number: [id number] Primary Diabetic Wound/Ulcer of the Lower Extremity Etiology: Wound Location: Left, Medial, Plantar Foot Wound Open Wounding Event: Gradually Appeared Status: Date Acquired: 05/11/2019 Comorbid Sleep Apnea, Deep Vein Thrombosis, Hypertension, Colitis, Type Weeks Of Treatment: 62 History: II Diabetes, Gout, Osteoarthritis, Neuropathy Clustered Wound: No Photos Wound Measurements Length: (cm) 0.2 Width: (cm) 0.3 Depth: (cm) 0.2 Area: (cm) 0.047 Volume: (cm) 0.009 % Reduction in Area: 95.4% % Reduction in Volume: 97.8% Epithelialization: Large (67-100%) Tunneling: No Undermining: No Wound Description Classification: Grade 1 Wound Margin: Distinct, outline attached Exudate Amount: Medium Exudate Type: Serosanguineous Exudate Color: red, brown Foul Odor After Cleansing: No Slough/Fibrino No Wound Bed Granulation Amount: Large (67-100%) Exposed Structure Granulation Quality: Pink, Pale Fascia Exposed: No Necrotic Amount: None Present (0%) Fat Layer (Subcutaneous Tissue) Exposed: Yes Tendon Exposed: No Muscle Exposed: No Joint Exposed: No Bone Exposed: No Electronic Signature(s) Signed: 03/26/2021 5:20:51 PM By: Rhae Hammock RN Signed: 03/26/2021 5:25:21 PM By: Deon Pilling Entered By: Rhae Hammock on 03/26/2021 15:57:57 -------------------------------------------------------------------------------- Vitals Details Patient Name: Date of Service: [name] 03/26/2021 3:30 PM Medical Record Number: [id number] Patient Account Number: [id number] Date of Birth/Sex: Treating RN: 1946/03/08 (75 y.o. F) Primary Care Lutisha Knoche: [name] Other Clinician: Referring Yacob Wilkerson: Treating Isador Castille/Extender: Landis Martins Weeks in Treatment: 11 Vital Signs Time Taken: 15:45 Temperature (F): 99.2 Pulse (bpm): 69 Respiratory Rate (breaths/min): 18 Blood Pressure (mmHg): 149/77 Reference Range: 80 - 120 mg / dl Electronic Signature(s) Signed: 04/08/2021 8:19:56 AM By: Sandre Kitty Entered By: Sandre Kitty on 03/26/2021 15:45:21

## 2021-04-09 ENCOUNTER — Other Ambulatory Visit: Payer: Self-pay

## 2021-04-09 ENCOUNTER — Encounter (HOSPITAL_BASED_OUTPATIENT_CLINIC_OR_DEPARTMENT_OTHER): Payer: Medicare Other | Admitting: Physician Assistant

## 2021-04-09 DIAGNOSIS — L97522 Non-pressure chronic ulcer of other part of left foot with fat layer exposed: Secondary | ICD-10-CM | POA: Diagnosis not present

## 2021-04-09 DIAGNOSIS — E1151 Type 2 diabetes mellitus with diabetic peripheral angiopathy without gangrene: Secondary | ICD-10-CM | POA: Diagnosis not present

## 2021-04-09 DIAGNOSIS — E1140 Type 2 diabetes mellitus with diabetic neuropathy, unspecified: Secondary | ICD-10-CM | POA: Diagnosis not present

## 2021-04-09 DIAGNOSIS — E11621 Type 2 diabetes mellitus with foot ulcer: Secondary | ICD-10-CM | POA: Diagnosis not present

## 2021-04-09 NOTE — Progress Notes (Addendum)
[name] [name] ([id number]) Visit Report for 04/09/2021 Chief Complaint Document Details Patient Name: Date of Service: [name] [name], [name] E. 04/09/2021 1:00 PM Medical Record Number: [id number] Patient Account Number: [id number] Date of Birth/Sex: Treating RN: [birth year] (75 y.o. [name] [name] Primary Care Provider: Billey [name] Other Clinician: Referring Provider: Treating Provider/Extender: [name] [name] in Treatment: 13 Information Obtained from: Patient Chief Complaint Left foot ulcer Electronic Signature(s) Signed: 04/09/2021 1:32:04 PM By: Worthy Keeler PA-C Entered By: Worthy [name] on 04/09/2021 13:32:04 -------------------------------------------------------------------------------- HPI Details Patient Name: Date of Service: [name] [name], [name] RA E. 04/09/2021 1:00 PM Medical Record Number: [id number] Patient Account Number: [id number] Date of Birth/Sex: Treating RN: Aug 12, 1945 (75 y.o. [name] [name] Primary Care Provider: Billey [name] Other Clinician: Referring Provider: Treating Provider/Extender: [name] [name] in Treatment: 13 History of Present Illness HPI Description: 01/17/2020 upon evaluation today patient presents for initial evaluation here in our clinic concerning issues she has been having with a left medial/plantar foot ulcer. This is actually been an issue for her since October 2020. She has been seeing Dr. Doran Durand for quite some time during that course. Fortunately there is no signs of active infection at this time. Or least no mention of this to have seen in general. With that being said unfortunately I do see some signs of erythema noted today that does have me concerned about the possibility of infection at this point in the surrounding area of the wound. There is also a warm to touch at the site which is somewhat concerning. Fortunately there is no evidence of systemic infection which is great news. The patient  does have a history of diabetes mellitus type 2, Charcot foot which is what led to the wound, and hypertension. She notes that she was in a cast for some time with Dr. Doran Durand for about 8 weeks. During that time they were utilizing according to the patient silver nitrate along with a foam doughnut and then Coban to secure in place in the cast in place. With that being said I do not have the actual records to review we are going to try to get a hold of those unfortunately they would not flow over into care everywhere I did look today. She has been seeing Dr. Doran Durand and his physician assistant [name] [name] up until the end of May and apparently is still seeing them on a regular basis every 2 weeks roughly. She has also tried Iodosorb without effect here. 01/24/2020 upon evaluation today patient actually appears to be doing quite well with regard to her wounds. She has been tolerating the dressing changes without complication. Fortunately there is no signs of active infection spreading which is good news. Her culture did show signs of Staph aureus I did place her on Augmentin due to the erythema surrounding the wound. With that being said the wound does appear to be doing better she has her longer walking cast/boot and I think that is actually good for her for the time being. I am considering reinitiating total contact cast when she gets back from vacation but next week she will actually be out of town at [employer] she knows not to get in the water but she still obviously is planning to enjoy herself she is going to take it easy on her foot however. 02/07/2020 upon evaluation today patient appears to be doing fairly well in regard to her ulcer on her foot. Fortunately there is no signs of  severe infection at this time which is great news and overall very pleased in that regard. With that being said I do think that she could still benefit from a total contact cast. Nonetheless she is using her walking boot which at  least provide some protection and that it prevents some of the friction occurring when she is ambulating. 02/14/2020 upon evaluation today patient appears to be doing well with regard to her foot ulcer. This is actually measuring a little bit smaller yet again this week. Overall very pleased with where things stand and I do not see any signs of active infection at this time which is also good news. Since she is measuring better the patient has wanting to somewhat hold off on proceeding with the total contact cast which I think is reasonable at this point. 02/28/2020 on evaluation today patient appears to be doing well in general in regard to her wound although she has a lot of callus buildup as compared to last time I saw her. This is can require sharp debridement today. I do believe she really needs the total contact cast as well which we have discussed previous. 7/23; patient comes in for a total contact cast change 03/06/2020 on evaluation today patient appears to be doing quite well with regard to her wounds. Fortunately the wound bed is measuring smaller and looking much better there is little callus noted although there is some debridement necessary today. 03/13/2020 on evaluation today patient's wound actually appears to be doing excellent which is great news. With that being said unfortunately she is having some issues currently with her left leg where she does have cellulitis it appears. This may have come from an area that rubbed underneath the cast from last week that we noted we padded that area and it looks to be doing excellent at this point but nonetheless the leg was somewhat painful, swollen, and somewhat erythematous. She also had an elevated white blood cell count of 11.5 based on what I saw on looking at her records from the med center in [facility] from where she was seen yesterday. Unfortunately with [place] having a provider on vacation there was no one here in the clinic in the afternoon  when she called therefore she went to the ER as advised. Subsequently they did not cut off the cast as they did not have anyone from orthopedics there to do so and subsequently also did not have the ability to do the Doppler for evaluation of DVT They recommended therefore given her dose of Eliquis as well as . Augmentin and sent her home to come see [place] today to have the cast taken off and then she is supposed to go back to have the study for DVT performed they are following. 03/20/2020 upon evaluation today patient appears to be doing well with regard to her foot all things considered we have not been able to use the total contact cast due to the infection that she had last week. She has been on the doxycycline and she had a 10-day supply of that I do believe that is helping and her leg appears to be doing better. With that being said there is fortunately no signs of active infection systemically at this time which is good news. No fevers, chills, nausea, vomiting, or diarrhea. 03/27/2020 upon evaluation today patient appears to be doing well with regard to her foot ulcer. There does not appear to be signs of active infection which is great news. Overall I  am very pleased with where things stand at this point. 04/03/2020 upon evaluation today patient appears to be doing pretty well in regard to the overall appearance of her wound. Fortunately there is no signs of active infection at this time which is great news. No fevers, chills, nausea, vomiting, or diarrhea. With that being said she does have some blue-green drainage that actually is a little bit concerning to me for the possibility of Pseudomonas. I discussed that with the patient today. With that being said I do believe that we may be able to manage this however with the topical antibiotic cream as opposed to having to do anything oral especially since she seems to be doing so well with overall appearance of the wound. 04/10/2020 on evaluation  today patient appears to be doing about the same roughly in regard to the overall size of her wound. With that being said she fortunately has not shown any signs of worsening overall which is good news. I do believe that she is doing a great job trying to offload but again she may still do better with the cast. I do not see in the blue-green drainage that we noticed previously I do believe the gentamicin help in this regard. 04/17/2020 on evaluation today patient's wound appears to be doing about the same at this point. There is no significant improvement at this point. No fever chills noted. She is up for put the cast back on the day. That she states in a couple weeks she will need to have this off to go to a workshop. 04/24/2020 on evaluation today patient appears to be doing significantly better in regard to her wound. Fortunately there is no signs of active infection and overall feel like she is making great progress the cast seems to have done excellent for her. 05/01/2020 upon evaluation today patient presents for reevaluation she really does not appear to be doing too badly in regard to the actual wound on the left foot we have been managing. Unfortunately she has bilateral lower extremity edema with blisters between the webspace of her first and second toe on both feet. She has a tremendous amount of edema in the legs which I think is where this is coming from it does not appear to be infected but nonetheless I do believe this is can be something that needs to be addressed today. Obviously this means we probably will not be putting the cast on at this point. She attributes this to the fact that she was sitting with her feet on the floor much longer during a conference last week she had a great time but unfortunately had a lot of complications as a result. 05/08/2020 upon evaluation today patient appears to be doing somewhat better in regard to her wounds at this time. Fortunately there is no signs of  active infection which is great news. With that being said I do believe that the blisters have ruptured and unfortunately did not just reattach I will remove some of the blistered tissue today. With that being said I do think the wound itself on the plantar aspect of left foot does need to have sharp debridement. 05/15/2020 upon evaluation today patient appears to be doing about the same in regard to her foot ulcer. Unfortunately in the past week her husband had a fall where he sustained a mild traumatic brain bleed. Fortunately he is doing better but being that he was in the hospital she had a walk on this a lot more.  The wound does not appear to be any better is also not really appearing to be significantly worse which is good news. There is no signs of active infection at this time. 10/14; patient with a small diabetic wound on the medial part of her left foot. We have been using silver collagen a total contact cast making good progress. I think the patient had a series of blisters on her dorsal foot probably secondary to having her legs recumbent for 3 days while in a conference in [place]. We wrapped her leg last week these are all healed. We did not previously have her in compression on the right leg. 05/29/2020 upon evaluation today patient appears to be doing well with regard to the wound on the plantar aspect of her foot medially. This is measuring smaller and looking much better than last time I saw her. Again when I did see her last was 2 weeks back and the wound was significantly larger. I do believe the cast is helping and I believe the collagen is a good option for her. 06/05/2020 on evaluation today patient appears to be doing well with regard to her foot ulcer this is actually measuring significantly better and overall I feel like she is doing excellent. There is no signs of active infection at this time. 06/12/2020 upon evaluation today patient actually continues to show signs of good  improvement which is excellent news. I am extremely pleased with how she seems to be progressing at this point in regard to her wound. There is still some depth to the wound but I do believe the collagen is helping her quite a bit. 06/19/2020 upon evaluation today patient appears to be doing well with regard to her plantar foot ulcer. She is actually making excellent progress and in fact this appears to be almost completely healed. With that being said I do believe that the patient is going to actually require 1 more week in the cast although after that I am hopeful she will be ready for discharge. 06/26/2020 on evaluation today patient appears to be doing well in regard to her wound currently. Fortunately there is no signs of active infection in general I feel like she is doing excellent. This appears to be completely healed I think she is ready to come out of the cast. 11/29; patient comes back in the clinic today with a very quick reopening in the exact same area on the medial plantar foot. She had been healed out last time. She went back into some new balance shoes that she got at [employer] with a custom insert. As it turns out this wound also happened when wearing these shoes although there was some modification made I think with the wound initially happened they added some foam around the wound area. This obviously is not going to be sufficient. 07/17/2020 on evaluation today patient appears to be doing well with regard to her wound. Fortunately she seems to be making good progress. Unfortunately she was in the hospital due to a issue with colitis and had just been discharged today in fact. She tells me that her biggest concern here is that a lot of her numbers especially her creatinine were somewhat elevated and problematic. She is can be following up with her provider in order to have a further work-up at this time. With that being said she did need to come back in for a cast change. She did not  allow them to remove the cast due to the fact that  she did not feel like that was the issue whatsoever and indeed it does not appear that was the case her wound appears to be doing excellent today. 07/24/2020 upon evaluation today patient appears to be doing well in regard to her foot ulcer. She still has a small opening but this is showing signs of excellent improvement overall but they were very close to complete resolution. No fevers, chills, nausea, vomiting, or diarrhea. 07/31/2020 upon evaluation today patient actually appears to be doing excellent she is actually completely healed this is great news. Fortunately there is no signs of active infection at this time. No fevers, chills, nausea, vomiting, or diarrhea. The patient tells me that she did see Dr. Paulla Dolly in order to get to [place] so that she can have a custom [name] made. She saw him earlier last week. She does have an appointment with Liliane Channel on the sixth I believe on January she tells me Readmission: 08/28/2020 on evaluation today patient appears to be doing poorly in regard to her wound currently. She tells me this has reopened. The good news that she did get measured for and actually her shoes are on the way in from [facility].. With that being said she has been trying to stay off of this is much as possible using her wheelchair around home. Nonetheless has been somewhat difficult. The good news is her shoes should be here next week 09/04/2020 upon evaluation today patient appears to be doing well with regard to her wound. There is a little bit of callus buildup but nothing too significant she does tell me she was very active this week. Fortunately there is no signs of systemic infection at this point. The dorsal foot wound actually appears to be doing better. I think this is healed. 09/11/2020 upon evaluation today patient appears to actually be doing quite well in my opinion based on what I am seeing today. Fortunately there is no signs  of infection in fact her mother is certain that this is even open any longer based on what I see. Fortunately I think the patient has been doing everything she can to try to keep this under control. 09/18/2020 upon evaluation today patient appears to be doing about the same in regard to her foot ulcer. She has been tolerating the dressing changes without complication. Fortunately there is no sign of active infection at this time. No fevers, chills, nausea, vomiting, or diarrhea. She is going require some sharp debridement today. 09/25/2020 upon evaluation today patient appears to be doing well with regard to his wounds she has been tolerating the dressing changes without complication. Her wound appears to be completely healed based on what I am seeing at this point. There does not appear to be any signs of active infection at this time which is great news. No fevers, chills, nausea, vomiting, or diarrhea. She did get the cushion for her foot as well that she ordered to try to help keep pressure off of this area. That looks like it may be very beneficial for her to be honest. 10/02/2020 upon evaluation today patient appears to be doing more poorly in regard to her foot ulcer. Unfortunately this has reopened since we saw her last week. It apparently did not take too long at all for this to happen. She is obviously somewhat disappointed as she was hopeful that that would be time this thing would stay closed. Fortunately there is no evidence of active infection at this point. No fevers, chills, nausea,  vomiting, or diarrhea. With that being said I am good have to perform a little bit of sharp debridement clear away some of the debris currently. Including a minimal amount of callus at this time. 10/16/2020 upon evaluation today patient appears to be doing about the same in regard to her foot ulcer unfortunately. There does not appear to be any signs of active infection which is great news. No fever chills noted  she has been tolerating the dressing changes without complication which is great news. Unfortunately she tells me that she has been very depressed about the situation is getting very frustrating to her that she continues to have issues despite everything that she is trying to do to stay off of her foot. She is extremely discouraged and I do hate to hear this. Obviously we have been hopeful that if she got her shoes that would make a difference she is actually can be getting those tomorrow but I am not certain that that alone is good to be enough to get this to heal. We may end up having to consider going back into a total contact cast to get this to heal and then subsequently once we get her healed get her into her new diabetic shoes which will and my hope anyway keep this from reopening again. 10/23/2020 upon evaluation today patient appears to be doing a little worse both in regard to the size of her wound as well as in regard to the fact that she has erythema surrounding the wound and wrap around the lateral part of her foot where she tells me has been somewhat sore. Fortunately there does not appear to be any evidence of infection systemically but locally definitely there is erythema and warmth consistent with local cellulitis. I am glad to remove some of the callus as well. 10/30/2020 upon evaluation today patient appears to be doing well with regard to her wound on the plantar aspect of her foot. This is significantly improved compared to last week. 10/30/2020 upon evaluation today patient actually is making excellent progress her wound appears to show signs of great improvement which is wonderful and that she is extremely pleased to hear this. She actually leaves Sunday to go on her trip with her half sister and friends. 11/13/2020 on evaluation today patient's wound actually appears to be doing quite well. There does not appear to be any signs of infection it has been 2 weeks since have seen her  she does have a little bit of callus buildup here today but at the same time I do not believe the vacation time set her back any just has not really made a lot of significant improvement. She is done with her antibiotics at this point. 11/20/2020 upon evaluation today patient appears to be doing poorly in regard to her foot. In fact this appears to be showing signs of Infection. She has erythema and warmth that is concerning. I know she is very discouraged as this seems to be a recurrent issue. I think we may need to delve further into the possibility of something deeper going on here as far as a structural infection. 11/27/2020 patient presents for 1 week follow-up. She had a culture of her left foot ulcer that grew staph aureus sensitive to Augmentin. This was called in by [place], [place] this morning. She is currently on doxycycline for previous culture result. Patient also had an x-ray done of her left foot that had conflicting results. We asked for clarification and was told we  would have clarification on Monday. Patient states overall she is doing well. She tries to stay off of her foot is much as possible. 12/04/2020 patient presents for 1 week follow-up. She is currently taking Augmentin and tolerating this well. It was confirmed that her x-ray had no acute osseous abnormalities. We switched her to collagen from silver alginate last week. Patient is overall doing well and trying to stay off her foot is much as possible. She has no complaints today. 12/11/2020 upon evaluation today patient appears to be doing well with regard to her wound. She has been tolerating the dressing changes without complication. Fortunately there is no signs of active infection noted at this point. I think she is definitely ready to go back into the cast. 12/18/2020 upon evaluation today patient appears to be doing well with regard to her wound. She has been tolerating the dressing changes without complication. Fortunately there  is no signs of active infection at this time. No fevers, chills, nausea, vomiting, or diarrhea. 12/25/2020 upon evaluation today patient appears to be doing well in general in regard to her plantar foot ulcer. With that being said she tells me currently that there does not appear to be any signs of pain or problems which is great in fact the wound appears to be almost completely closed which is also excellent news. I do not see any signs of infection which is great news as well. No fevers, chills, nausea, vomiting, or diarrhea. 5/25; patient presents for 1 week follow-up. She has had the cast in place for the past week and has tolerated this well. She states she is getting orthotics on 5/10. She currently denies signs of infection and has no issues today. 01/08/2021 upon evaluation today patient appears unfortunately to be present today with issues that she has been having with her plantar foot wound. This apparently has reoccurred unfortunately and is causing quite a bit of discomfort and problems for her. There does not appear to be any signs of active infection which is great news and overall I am very pleased with where things stand in that regard but nonetheless I think she is definitely worried about where this can lead due to past history. She was just taken out of the cast and marked is healed last week. 01/15/2021 upon evaluation today patient unfortunately appears to be doing well but nonetheless continues to have issues with pressure and friction to her foot whenever she is not wearing the cast. Again today I think the wound is significantly improved and almost completely healed in 1 week's time after debridement last week. Nonetheless this has continued to be an issue for that reason we did do a MRI with contrast which really revealed no evidence whatsoever of an issue here. Again this is great but nonetheless still offers up the question what regularly to keep this from continuing to just reopen  every time that we see. Again I really do not know exactly what to do is I feel like this falls on the ground outside of wound care more into a issue more specifically with the orthopedic side of things. I understand the concern that a brace would probably be problematic for the patient. That makes complete sense to be honest. It would probably cause rubbing on other areas which would not be good. With that being said also I am not sure what else we can do other than just keeping her in the cast which is really not a feasible option either. Every  time I take her out of the cast this reopens almost immediately. 01/22/2021 upon evaluation today patient appears to be doing well with regard to her foot ulcer. This is going to require little bit of sharp debridement today. Fortunately there does not appear to be any signs of active infection at this time. No fevers, chills, nausea, vomiting, or diarrhea. I did previously last week tell the patient that I would contact Dr. Nona Dell office I did do so I spoke with [name] [name] who is the physician assistant for Dr. Doran Durand. Subsequently he did relate to me that they really did not feel like there was anything that could be done differently for the patient. That is from an orthopedic standpoint. He said that the patient could make an appoint with Dr. Doran Durand to discuss further but he was not really sure that would make a big difference in the treatment plan to be honest. Subsequently he further did recommend that potentially the patient after I offered a thought on the potential for a Crow boot could benefit from this since she seems to do well with a total contact cast. With that being said he did advise that Terry would probably be the way to go for that if need be. 01/29/2021 upon evaluation today patient appears to be doing well with regard to her foot ulcer. In fact this pretty much appears to be healed. With that being said of course I am reluctant to absolutely  call it healed and just discharge her due to things we have had going on in the past. I think we still need to monitor and watch out for this for that reason I am going to ensure nothing is hiding or potentially get a reopen honest by bringing her back next week after using some foam over the area today. 02/05/2021 on evaluation today patient's wound actually appears to be completely closed. I do not see anything open on anything even threatened open. With that being said she did have drainage on her dressing it may appear that she had a small blister that occurred, rupture, and has resolved. Overall I do not see anything else concerning at this point. 02/12/2021 upon evaluation today patient actually still continues to maintain closure as far as her wound is concerned this is actually long as she has been without anything reopening. She has been extremely careful with that however. At this point I think she does need to start slowly and carefully increasing her activity with her shoes wants to get checked tomorrow. She has her appointment with Dr. Prudy Feeler next Wednesday and the following Wednesday with Dr. Doran Durand. 03/12/2021 upon evaluation today patient appears to be doing a little worse obviously and the fact that she has been healed for a bit of time and then subsequently reopened in the past week. We got her scheduled for a repeat appointment here to try to get things back under control. Fortunately there does not appear to be any evidence of active infection. Unfortunately I do believe we need to clear away some of the callus and necrotic debris. 03/19/2021 upon evaluation today patient appears to be doing decently well in regard to her foot ulcer to be honest. Fortunately there does not appear to be any signs of active infection which is great news. No fevers, chills, nausea, vomiting, or diarrhea. Overall I am extremely pleased with where things are progressing. She did see the specialist at [facility]  clinic in order to see about new shoes. With that being said  they recommended at this point that the best option would probably be for the patient to continue along with the current [name] although he did make some modifications to it and apparently he thinks that this would likely be quite well for her. I did check out the modifications made and it seems like it is can to keep pressure off decently well in my opinion. Overall I am pretty pleased with her experience there that he was not willing to just go for another 600. Shoes that insurance likely would not pay for when she has appropriately good pair currently that he can modify. 03/26/2021 upon evaluation today patient's wound unfortunately is doing about the same. I am really not seeing signs of improvement which is not what we were looking for. We were hoping that she would be doing better this week. Nonetheless I think putting her back in a total contact cast is probably inevitable at this point. 04/02/2021 upon evaluation today patient appears to be doing well with regard to her wound compared to even last week. Fortunately there is no signs of active infection and overall I am actually extremely pleased with where things stand this is a least half the size it was last week. Overall I think she is very close to complete resolution. My goal would be to see whether or not we can get this closed in the next week so she will be able to eat the following week have nothing but her diabetic [name] and see if that sufficient to keep her wound protected enough to get it to heal. Overall I am extremely happy in general with where things stand. 04/09/2021 upon evaluation today patient actually appears to be doing quite well in regard to her wound. She has been tolerating the dressing changes without complication. Fortunately there does not appear to be any evidence of active infection which is great news. No fevers, chills, nausea, vomiting, or  diarrhea. Electronic Signature(s) Signed: 04/09/2021 2:26:33 PM By: Worthy Keeler PA-C Entered By: Worthy [name] on 04/09/2021 14:26:33 -------------------------------------------------------------------------------- Physical Exam Details Patient Name: Date of Service: [name] [name], [name] RA E. 04/09/2021 1:00 PM Medical Record Number: [id number] Patient Account Number: [id number] Date of Birth/Sex: Treating RN: 1945-09-24 (75 y.o. [name] [name] Primary Care Provider: Billey [name] Other Clinician: Referring Provider: Treating Provider/Extender: Landis Martins Weeks in Treatment: 64 Constitutional Well-nourished and well-hydrated in no acute distress. Respiratory normal breathing without difficulty. Psychiatric this patient is able to make decisions and demonstrates good insight into disease process. Alert and Oriented x 3. pleasant and cooperative. Notes Upon inspection patient's wound bed actually showed signs of excellent granulation epithelization there was some callus buildup I did actually clear this away and the good news of the wound is completely healed. I do not see any signs of active infection at this time which is also great news. Overall I think that we will get a not put her in the cast today since this seems to be so solid and we will see how things do over the next week. Electronic Signature(s) Signed: 04/09/2021 2:27:05 PM By: Worthy Keeler PA-C Entered By: Worthy [name] on 04/09/2021 14:27:04 -------------------------------------------------------------------------------- Physician Orders Details Patient Name: Date of Service: [name] [name], [name] RA E. 04/09/2021 1:00 PM Medical Record Number: [id number] Patient Account Number: [id number] Date of Birth/Sex: Treating RN: May 11, 1946 (75 y.o. [name] Primary Care Provider: Billey [name] Other Clinician: Referring Provider: Treating Provider/Extender: Earlean Shawl, Stacy Weeks in  Treatment: 13 Verbal / Phone Orders: No Diagnosis Coding ICD-10 Coding Code Description E11.621 Type 2 diabetes mellitus with foot ulcer L97.522 Non-pressure chronic ulcer of other part of left foot with fat layer exposed M14.672 Charcot's joint, left ankle and foot E11.40 Type 2 diabetes mellitus with diabetic neuropathy, unspecified Follow-up Appointments ppointment in 2 weeks. Margarita Grizzle Wednesday; if area remains closed and no issues can call and cancel appt time. Return A Bathing/ Shower/ Hygiene May shower with protection but do not get wound dressing(s) wet. Off-Loading Other: - custom diabetic shoes. ensure to pad area with gauze or foam for protection. Closely monitor area. Additional Orders / Instructions Follow Nutritious Diet Electronic Signature(s) Signed: 04/09/2021 5:21:16 PM By: Worthy Keeler PA-C Signed: 04/09/2021 6:06:32 PM By: Deon Pilling Entered By: Deon Pilling on 04/09/2021 14:16:47 -------------------------------------------------------------------------------- Problem List Details Patient Name: Date of Service: [name] Guarneri RBA RA E. 04/09/2021 1:00 PM Medical Record Number: [id number] Patient Account Number: [id number] Date of Birth/Sex: Treating RN: Jan 23, 1946 (74 y.o. [name] [name] Primary Care Provider: Billey [name] Other Clinician: Referring Provider: Treating Provider/Extender: [name] [name] in Treatment: 13 Active Problems ICD-10 Encounter Code Description Active Date MDM Diagnosis E11.621 Type 2 diabetes mellitus with foot ulcer 01/08/2021 No Yes L97.522 Non-pressure chronic ulcer of other part of left foot with fat layer exposed 01/08/2021 No Yes M14.672 Charcot's joint, left ankle and foot 01/08/2021 No Yes E11.40 Type 2 diabetes mellitus with diabetic neuropathy, unspecified 01/08/2021 No Yes Inactive Problems Resolved Problems Electronic Signature(s) Signed: 04/09/2021 1:31:57 PM By: Worthy Keeler PA-C Entered By:  Worthy [name] on 04/09/2021 13:31:56 -------------------------------------------------------------------------------- Progress Note Details Patient Name: Date of Service: [name] [name], [name] RA E. 04/09/2021 1:00 PM Medical Record Number: [id number] Patient Account Number: [id number] Date of Birth/Sex: Treating RN: [birth year] (75 y.o. [name] [name] Primary Care Provider: Billey [name] Other Clinician: Referring Provider: Treating Provider/Extender: [name] [name] in Treatment: 13 Subjective Chief Complaint Information obtained from Patient Left foot ulcer History of Present Illness (HPI) 01/17/2020 upon evaluation today patient presents for initial evaluation here in our clinic concerning issues she has been having with a left medial/plantar foot ulcer. This is actually been an issue for her since October 2020. She has been seeing Dr. Doran Durand for quite some time during that course. Fortunately there is no signs of active infection at this time. Or least no mention of this to have seen in general. With that being said unfortunately I do see some signs of erythema noted today that does have me concerned about the possibility of infection at this point in the surrounding area of the wound. There is also a warm to touch at the site which is somewhat concerning. Fortunately there is no evidence of systemic infection which is great news. The patient does have a history of diabetes mellitus type 2, Charcot foot which is what led to the wound, and hypertension. She notes that she was in a cast for some time with Dr. Doran Durand for about 8 weeks. During that time they were utilizing according to the patient silver nitrate along with a foam doughnut and then Coban to secure in place in the cast in place. With that being said I do not have the actual records to review we are going to try to get a hold of those unfortunately they would not flow over into care everywhere I did look today. She  has been seeing Dr. Doran Durand and his physician assistant [name] [name]  up until the end of May and apparently is still seeing them on a regular basis every 2 weeks roughly. She has also tried Iodosorb without effect here. 01/24/2020 upon evaluation today patient actually appears to be doing quite well with regard to her wounds. She has been tolerating the dressing changes without complication. Fortunately there is no signs of active infection spreading which is good news. Her culture did show signs of Staph aureus I did place her on Augmentin due to the erythema surrounding the wound. With that being said the wound does appear to be doing better she has her longer walking cast/boot and I think that is actually good for her for the time being. I am considering reinitiating total contact cast when she gets back from vacation but next week she will actually be out of town at [employer] she knows not to get in the water but she still obviously is planning to enjoy herself she is going to take it easy on her foot however. 02/07/2020 upon evaluation today patient appears to be doing fairly well in regard to her ulcer on her foot. Fortunately there is no signs of severe infection at this time which is great news and overall very pleased in that regard. With that being said I do think that she could still benefit from a total contact cast. Nonetheless she is using her walking boot which at least provide some protection and that it prevents some of the friction occurring when she is ambulating. 02/14/2020 upon evaluation today patient appears to be doing well with regard to her foot ulcer. This is actually measuring a little bit smaller yet again this week. Overall very pleased with where things stand and I do not see any signs of active infection at this time which is also good news. Since she is measuring better the patient has wanting to somewhat hold off on proceeding with the total contact cast which I think is  reasonable at this point. 02/28/2020 on evaluation today patient appears to be doing well in general in regard to her wound although she has a lot of callus buildup as compared to last time I saw her. This is can require sharp debridement today. I do believe she really needs the total contact cast as well which we have discussed previous. 7/23; patient comes in for a total contact cast change 03/06/2020 on evaluation today patient appears to be doing quite well with regard to her wounds. Fortunately the wound bed is measuring smaller and looking much better there is little callus noted although there is some debridement necessary today. 03/13/2020 on evaluation today patient's wound actually appears to be doing excellent which is great news. With that being said unfortunately she is having some issues currently with her left leg where she does have cellulitis it appears. This may have come from an area that rubbed underneath the cast from last week that we noted we padded that area and it looks to be doing excellent at this point but nonetheless the leg was somewhat painful, swollen, and somewhat erythematous. She also had an elevated white blood cell count of 11.5 based on what I saw on looking at her records from the med center in [facility] from where she was seen yesterday. Unfortunately with [place] having a provider on vacation there was no one here in the clinic in the afternoon when she called therefore she went to the ER as advised. Subsequently they did not cut off the cast as they  did not have anyone from orthopedics there to do so and subsequently also did not have the ability to do the Doppler for evaluation of DVT They recommended therefore given her dose of Eliquis as well as . Augmentin and sent her home to come see [place] today to have the cast taken off and then she is supposed to go back to have the study for DVT performed they are following. 03/20/2020 upon evaluation today patient appears to  be doing well with regard to her foot all things considered we have not been able to use the total contact cast due to the infection that she had last week. She has been on the doxycycline and she had a 10-day supply of that I do believe that is helping and her leg appears to be doing better. With that being said there is fortunately no signs of active infection systemically at this time which is good news. No fevers, chills, nausea, vomiting, or diarrhea. 03/27/2020 upon evaluation today patient appears to be doing well with regard to her foot ulcer. There does not appear to be signs of active infection which is great news. Overall I am very pleased with where things stand at this point. 04/03/2020 upon evaluation today patient appears to be doing pretty well in regard to the overall appearance of her wound. Fortunately there is no signs of active infection at this time which is great news. No fevers, chills, nausea, vomiting, or diarrhea. With that being said she does have some blue-green drainage that actually is a little bit concerning to me for the possibility of Pseudomonas. I discussed that with the patient today. With that being said I do believe that we may be able to manage this however with the topical antibiotic cream as opposed to having to do anything oral especially since she seems to be doing so well with overall appearance of the wound. 04/10/2020 on evaluation today patient appears to be doing about the same roughly in regard to the overall size of her wound. With that being said she fortunately has not shown any signs of worsening overall which is good news. I do believe that she is doing a great job trying to offload but again she may still do better with the cast. I do not see in the blue-green drainage that we noticed previously I do believe the gentamicin help in this regard. 04/17/2020 on evaluation today patient's wound appears to be doing about the same at this point. There is no  significant improvement at this point. No fever chills noted. She is up for put the cast back on the day. That she states in a couple weeks she will need to have this off to go to a workshop. 04/24/2020 on evaluation today patient appears to be doing significantly better in regard to her wound. Fortunately there is no signs of active infection and overall feel like she is making great progress the cast seems to have done excellent for her. 05/01/2020 upon evaluation today patient presents for reevaluation she really does not appear to be doing too badly in regard to the actual wound on the left foot we have been managing. Unfortunately she has bilateral lower extremity edema with blisters between the webspace of her first and second toe on both feet. She has a tremendous amount of edema in the legs which I think is where this is coming from it does not appear to be infected but nonetheless I do believe this is can be something that  needs to be addressed today. Obviously this means we probably will not be putting the cast on at this point. She attributes this to the fact that she was sitting with her feet on the floor much longer during a conference last week she had a great time but unfortunately had a lot of complications as a result. 05/08/2020 upon evaluation today patient appears to be doing somewhat better in regard to her wounds at this time. Fortunately there is no signs of active infection which is great news. With that being said I do believe that the blisters have ruptured and unfortunately did not just reattach I will remove some of the blistered tissue today. With that being said I do think the wound itself on the plantar aspect of left foot does need to have sharp debridement. 05/15/2020 upon evaluation today patient appears to be doing about the same in regard to her foot ulcer. Unfortunately in the past week her husband had a fall where he sustained a mild traumatic brain bleed. Fortunately  he is doing better but being that he was in the hospital she had a walk on this a lot more. The wound does not appear to be any better is also not really appearing to be significantly worse which is good news. There is no signs of active infection at this time. 10/14; patient with a small diabetic wound on the medial part of her left foot. We have been using silver collagen a total contact cast making good progress. I think the patient had a series of blisters on her dorsal foot probably secondary to having her legs recumbent for 3 days while in a conference in [place]. We wrapped her leg last week these are all healed. We did not previously have her in compression on the right leg. 05/29/2020 upon evaluation today patient appears to be doing well with regard to the wound on the plantar aspect of her foot medially. This is measuring smaller and looking much better than last time I saw her. Again when I did see her last was 2 weeks back and the wound was significantly larger. I do believe the cast is helping and I believe the collagen is a good option for her. 06/05/2020 on evaluation today patient appears to be doing well with regard to her foot ulcer this is actually measuring significantly better and overall I feel like she is doing excellent. There is no signs of active infection at this time. 06/12/2020 upon evaluation today patient actually continues to show signs of good improvement which is excellent news. I am extremely pleased with how she seems to be progressing at this point in regard to her wound. There is still some depth to the wound but I do believe the collagen is helping her quite a bit. 06/19/2020 upon evaluation today patient appears to be doing well with regard to her plantar foot ulcer. She is actually making excellent progress and in fact this appears to be almost completely healed. With that being said I do believe that the patient is going to actually require 1 more week in the  cast although after that I am hopeful she will be ready for discharge. 06/26/2020 on evaluation today patient appears to be doing well in regard to her wound currently. Fortunately there is no signs of active infection in general I feel like she is doing excellent. This appears to be completely healed I think she is ready to come out of the cast. 11/29; patient comes back in  the clinic today with a very quick reopening in the exact same area on the medial plantar foot. She had been healed out last time. She went back into some new balance shoes that she got at [employer] with a custom insert. As it turns out this wound also happened when wearing these shoes although there was some modification made I think with the wound initially happened they added some foam around the wound area. This obviously is not going to be sufficient. 07/17/2020 on evaluation today patient appears to be doing well with regard to her wound. Fortunately she seems to be making good progress. Unfortunately she was in the hospital due to a issue with colitis and had just been discharged today in fact. She tells me that her biggest concern here is that a lot of her numbers especially her creatinine were somewhat elevated and problematic. She is can be following up with her provider in order to have a further work-up at this time. With that being said she did need to come back in for a cast change. She did not allow them to remove the cast due to the fact that she did not feel like that was the issue whatsoever and indeed it does not appear that was the case her wound appears to be doing excellent today. 07/24/2020 upon evaluation today patient appears to be doing well in regard to her foot ulcer. She still has a small opening but this is showing signs of excellent improvement overall but they were very close to complete resolution. No fevers, chills, nausea, vomiting, or diarrhea. 07/31/2020 upon evaluation today patient actually  appears to be doing excellent she is actually completely healed this is great news. Fortunately there is no signs of active infection at this time. No fevers, chills, nausea, vomiting, or diarrhea. The patient tells me that she did see Dr. Paulla Dolly in order to get to [place] so that she can have a custom [name] made. She saw him earlier last week. She does have an appointment with Liliane Channel on the sixth I believe on January she tells me Readmission: 08/28/2020 on evaluation today patient appears to be doing poorly in regard to her wound currently. She tells me this has reopened. The good news that she did get measured for and actually her shoes are on the way in from [facility].. With that being said she has been trying to stay off of this is much as possible using her wheelchair around home. Nonetheless has been somewhat difficult. The good news is her shoes should be here next week 09/04/2020 upon evaluation today patient appears to be doing well with regard to her wound. There is a little bit of callus buildup but nothing too significant she does tell me she was very active this week. Fortunately there is no signs of systemic infection at this point. The dorsal foot wound actually appears to be doing better. I think this is healed. 09/11/2020 upon evaluation today patient appears to actually be doing quite well in my opinion based on what I am seeing today. Fortunately there is no signs of infection in fact her mother is certain that this is even open any longer based on what I see. Fortunately I think the patient has been doing everything she can to try to keep this under control. 09/18/2020 upon evaluation today patient appears to be doing about the same in regard to her foot ulcer. She has been tolerating the dressing changes without complication. Fortunately there is no  sign of active infection at this time. No fevers, chills, nausea, vomiting, or diarrhea. She is going require some sharp debridement  today. 09/25/2020 upon evaluation today patient appears to be doing well with regard to his wounds she has been tolerating the dressing changes without complication. Her wound appears to be completely healed based on what I am seeing at this point. There does not appear to be any signs of active infection at this time which is great news. No fevers, chills, nausea, vomiting, or diarrhea. She did get the cushion for her foot as well that she ordered to try to help keep pressure off of this area. That looks like it may be very beneficial for her to be honest. 10/02/2020 upon evaluation today patient appears to be doing more poorly in regard to her foot ulcer. Unfortunately this has reopened since we saw her last week. It apparently did not take too long at all for this to happen. She is obviously somewhat disappointed as she was hopeful that that would be time this thing would stay closed. Fortunately there is no evidence of active infection at this point. No fevers, chills, nausea, vomiting, or diarrhea. With that being said I am good have to perform a little bit of sharp debridement clear away some of the debris currently. Including a minimal amount of callus at this time. 10/16/2020 upon evaluation today patient appears to be doing about the same in regard to her foot ulcer unfortunately. There does not appear to be any signs of active infection which is great news. No fever chills noted she has been tolerating the dressing changes without complication which is great news. Unfortunately she tells me that she has been very depressed about the situation is getting very frustrating to her that she continues to have issues despite everything that she is trying to do to stay off of her foot. She is extremely discouraged and I do hate to hear this. Obviously we have been hopeful that if she got her shoes that would make a difference she is actually can be getting those tomorrow but I am not certain that that  alone is good to be enough to get this to heal. We may end up having to consider going back into a total contact cast to get this to heal and then subsequently once we get her healed get her into her new diabetic shoes which will and my hope anyway keep this from reopening again. 10/23/2020 upon evaluation today patient appears to be doing a little worse both in regard to the size of her wound as well as in regard to the fact that she has erythema surrounding the wound and wrap around the lateral part of her foot where she tells me has been somewhat sore. Fortunately there does not appear to be any evidence of infection systemically but locally definitely there is erythema and warmth consistent with local cellulitis. I am glad to remove some of the callus as well. 10/30/2020 upon evaluation today patient appears to be doing well with regard to her wound on the plantar aspect of her foot. This is significantly improved compared to last week. 10/30/2020 upon evaluation today patient actually is making excellent progress her wound appears to show signs of great improvement which is wonderful and that she is extremely pleased to hear this. She actually leaves Sunday to go on her trip with her half sister and friends. 11/13/2020 on evaluation today patient's wound actually appears to be doing quite well. There  does not appear to be any signs of infection it has been 2 weeks since have seen her she does have a little bit of callus buildup here today but at the same time I do not believe the vacation time set her back any just has not really made a lot of significant improvement. She is done with her antibiotics at this point. 11/20/2020 upon evaluation today patient appears to be doing poorly in regard to her foot. In fact this appears to be showing signs of Infection. She has erythema and warmth that is concerning. I know she is very discouraged as this seems to be a recurrent issue. I think we may need to delve  further into the possibility of something deeper going on here as far as a structural infection. 11/27/2020 patient presents for 1 week follow-up. She had a culture of her left foot ulcer that grew staph aureus sensitive to Augmentin. This was called in by [place], [place] this morning. She is currently on doxycycline for previous culture result. Patient also had an x-ray done of her left foot that had conflicting results. We asked for clarification and was told we would have clarification on Monday. Patient states overall she is doing well. She tries to stay off of her foot is much as possible. 12/04/2020 patient presents for 1 week follow-up. She is currently taking Augmentin and tolerating this well. It was confirmed that her x-ray had no acute osseous abnormalities. We switched her to collagen from silver alginate last week. Patient is overall doing well and trying to stay off her foot is much as possible. She has no complaints today. 12/11/2020 upon evaluation today patient appears to be doing well with regard to her wound. She has been tolerating the dressing changes without complication. Fortunately there is no signs of active infection noted at this point. I think she is definitely ready to go back into the cast. 12/18/2020 upon evaluation today patient appears to be doing well with regard to her wound. She has been tolerating the dressing changes without complication. Fortunately there is no signs of active infection at this time. No fevers, chills, nausea, vomiting, or diarrhea. 12/25/2020 upon evaluation today patient appears to be doing well in general in regard to her plantar foot ulcer. With that being said she tells me currently that there does not appear to be any signs of pain or problems which is great in fact the wound appears to be almost completely closed which is also excellent news. I do not see any signs of infection which is great news as well. No fevers, chills, nausea, vomiting, or  diarrhea. 5/25; patient presents for 1 week follow-up. She has had the cast in place for the past week and has tolerated this well. She states she is getting orthotics on 5/10. She currently denies signs of infection and has no issues today. 01/08/2021 upon evaluation today patient appears unfortunately to be present today with issues that she has been having with her plantar foot wound. This apparently has reoccurred unfortunately and is causing quite a bit of discomfort and problems for her. There does not appear to be any signs of active infection which is great news and overall I am very pleased with where things stand in that regard but nonetheless I think she is definitely worried about where this can lead due to past history. She was just taken out of the cast and marked is healed last week. 01/15/2021 upon evaluation today patient unfortunately appears to be  doing well but nonetheless continues to have issues with pressure and friction to her foot whenever she is not wearing the cast. Again today I think the wound is significantly improved and almost completely healed in 1 week's time after debridement last week. Nonetheless this has continued to be an issue for that reason we did do a MRI with contrast which really revealed no evidence whatsoever of an issue here. Again this is great but nonetheless still offers up the question what regularly to keep this from continuing to just reopen every time that we see. Again I really do not know exactly what to do is I feel like this falls on the ground outside of wound care more into a issue more specifically with the orthopedic side of things. I understand the concern that a brace would probably be problematic for the patient. That makes complete sense to be honest. It would probably cause rubbing on other areas which would not be good. With that being said also I am not sure what else we can do other than just keeping her in the cast which is really not  a feasible option either. Every time I take her out of the cast this reopens almost immediately. 01/22/2021 upon evaluation today patient appears to be doing well with regard to her foot ulcer. This is going to require little bit of sharp debridement today. Fortunately there does not appear to be any signs of active infection at this time. No fevers, chills, nausea, vomiting, or diarrhea. I did previously last week tell the patient that I would contact Dr. Nona Dell office I did do so I spoke with [name] [name] who is the physician assistant for Dr. Doran Durand. Subsequently he did relate to me that they really did not feel like there was anything that could be done differently for the patient. That is from an orthopedic standpoint. He said that the patient could make an appoint with Dr. Doran Durand to discuss further but he was not really sure that would make a big difference in the treatment plan to be honest. Subsequently he further did recommend that potentially the patient after I offered a thought on the potential for a Crow boot could benefit from this since she seems to do well with a total contact cast. With that being said he did advise that [place] would probably be the way to go for that if need be. 01/29/2021 upon evaluation today patient appears to be doing well with regard to her foot ulcer. In fact this pretty much appears to be healed. With that being said of course I am reluctant to absolutely call it healed and just discharge her due to things we have had going on in the past. I think we still need to monitor and watch out for this for that reason I am going to ensure nothing is hiding or potentially get a reopen honest by bringing her back next week after using some foam over the area today. 02/05/2021 on evaluation today patient's wound actually appears to be completely closed. I do not see anything open on anything even threatened open. With that being said she did have drainage on her dressing it may  appear that she had a small blister that occurred, rupture, and has resolved. Overall I do not see anything else concerning at this point. 02/12/2021 upon evaluation today patient actually still continues to maintain closure as far as her wound is concerned this is actually long as she has been without anything reopening. She has  been extremely careful with that however. At this point I think she does need to start slowly and carefully increasing her activity with her shoes wants to get checked tomorrow. She has her appointment with Dr. Prudy Feeler next Wednesday and the following Wednesday with Dr. Doran Durand. 03/12/2021 upon evaluation today patient appears to be doing a little worse obviously and the fact that she has been healed for a bit of time and then subsequently reopened in the past week. We got her scheduled for a repeat appointment here to try to get things back under control. Fortunately there does not appear to be any evidence of active infection. Unfortunately I do believe we need to clear away some of the callus and necrotic debris. 03/19/2021 upon evaluation today patient appears to be doing decently well in regard to her foot ulcer to be honest. Fortunately there does not appear to be any signs of active infection which is great news. No fevers, chills, nausea, vomiting, or diarrhea. Overall I am extremely pleased with where things are progressing. She did see the specialist at [facility] clinic in order to see about new shoes. With that being said they recommended at this point that the best option would probably be for the patient to continue along with the current [name] although he did make some modifications to it and apparently he thinks that this would likely be quite well for her. I did check out the modifications made and it seems like it is can to keep pressure off decently well in my opinion. Overall I am pretty pleased with her experience there that he was not willing to just go for another  600. Shoes that insurance likely would not pay for when she has appropriately good pair currently that he can modify. 03/26/2021 upon evaluation today patient's wound unfortunately is doing about the same. I am really not seeing signs of improvement which is not what we were looking for. We were hoping that she would be doing better this week. Nonetheless I think putting her back in a total contact cast is probably inevitable at this point. 04/02/2021 upon evaluation today patient appears to be doing well with regard to her wound compared to even last week. Fortunately there is no signs of active infection and overall I am actually extremely pleased with where things stand this is a least half the size it was last week. Overall I think she is very close to complete resolution. My goal would be to see whether or not we can get this closed in the next week so she will be able to eat the following week have nothing but her diabetic [name] and see if that sufficient to keep her wound protected enough to get it to heal. Overall I am extremely happy in general with where things stand. 04/09/2021 upon evaluation today patient actually appears to be doing quite well in regard to her wound. She has been tolerating the dressing changes without complication. Fortunately there does not appear to be any evidence of active infection which is great news. No fevers, chills, nausea, vomiting, or diarrhea. Objective Constitutional Well-nourished and well-hydrated in no acute distress. Vitals Time Taken: 1:25 PM, Temperature: 99.7 F, Pulse: 88 bpm, Respiratory Rate: 16 breaths/min, Blood Pressure: 118/59 mmHg. Respiratory normal breathing without difficulty. Psychiatric this patient is able to make decisions and demonstrates good insight into disease process. Alert and Oriented x 3. pleasant and cooperative. General Notes: Upon inspection patient's wound bed actually showed signs of excellent granulation  epithelization  there was some callus buildup I did actually clear this away and the good news of the wound is completely healed. I do not see any signs of active infection at this time which is also great news. Overall I think that we will get a not put her in the cast today since this seems to be so solid and we will see how things do over the next week. Integumentary (Hair, Skin) Wound #[id number] status is Healed - Epithelialized. Original cause of wound was Gradually Appeared. The date acquired was: 05/11/2019. The wound has been in treatment 64 weeks. The wound is located on the [place]. The wound measures 0cm length x 0cm width x 0cm depth; 0cm^2 area and 0cm^3 volume. There is Fat Layer (Subcutaneous Tissue) exposed. There is no tunneling or undermining noted. There is a none present amount of drainage noted. The wound margin is distinct with the outline attached to the wound base. There is no granulation within the wound bed. There is no necrotic tissue within the wound bed. General Notes: callous noted. Assessment Active Problems ICD-10 Type 2 diabetes mellitus with foot ulcer Non-pressure chronic ulcer of other part of left foot with fat layer exposed Charcot's joint, left ankle and foot Type 2 diabetes mellitus with diabetic neuropathy, unspecified Plan Follow-up Appointments: Return Appointment in 2 weeks. Margarita Grizzle Wednesday; if area remains closed and no issues can call and cancel appt time. Bathing/ Shower/ Hygiene: May shower with protection but do not get wound dressing(s) wet. Off-Loading: Other: - custom diabetic shoes. ensure to pad area with gauze or foam for protection. Closely monitor area. Additional Orders / Instructions: Follow Nutritious Diet 1. Would recommend currently that we going to continue with wound care measures as before and the patient is in agreement with plan. This includes the use of the offloading [name] she does have a diabetic [name] that is  been modified for her and I were hoping this will keep it healed now that it is finally healed. 2. I am also can recommend that she keep the appointment next Thursday with the individual who did modify the [name] they are going to check and make sure that everything seems to be doing well. Obviously that something that if its not may need to be addressed as well we will see how things go. 3. We will have an appointment for 2 weeks for here although if she is doing excellent she can always cancel that appointment. We will see patient back for reevaluation in 1 week here in the clinic. If anything worsens or changes patient will contact our office for additional recommendations. Electronic Signature(s) Signed: 04/09/2021 2:27:57 PM By: Worthy Keeler PA-C Entered By: Worthy [name] on 04/09/2021 14:27:57 -------------------------------------------------------------------------------- SuperBill Details Patient Name: Date of Service: [name] [name], [name] RA E. 04/09/2021 Medical Record Number: [id number] Patient Account Number: [id number] Date of Birth/Sex: Treating RN: November 01, 1945 (75 y.o. [name] [name], Meta.Reding Primary Care Provider: Billey [name] Other Clinician: Referring Provider: Treating Provider/Extender: [name] [name] in Treatment: 13 Diagnosis Coding ICD-10 Codes Code Description E11.621 Type 2 diabetes mellitus with foot ulcer L97.522 Non-pressure chronic ulcer of other part of left foot with fat layer exposed M14.672 Charcot's joint, left ankle and foot E11.40 Type 2 diabetes mellitus with diabetic neuropathy, unspecified Facility Procedures CPT4 Code: [id number] Description: [id number] - WOUND CARE VISIT-LEV 3 EST PT Modifier: Quantity: 1 Physician Procedures Electronic Signature(s) Signed: 04/09/2021 2:28:15 PM By: Worthy Keeler PA-C Entered By: Worthy [name]  on 04/09/2021 14:28:15

## 2021-04-09 NOTE — Progress Notes (Addendum)
[name] [name], [name] [name] ([id number]) Visit Report for 04/09/2021 Arrival Information Details Patient Name: Date of Service: [name], ALLIGOOD [name] E. 04/09/2021 1:00 PM Medical Record Number: [id number] Patient Account Number: [id number] Date of Birth/Sex: Treating RN: [birth year] (75 y.o. [name] [name] Primary Care Mattalynn Crandle: [name] Other Clinician: Referring Zhanna Melin: Treating Dahir Ayer/Extender: Adele Schilder in Treatment: 13 Visit Information History Since Last Visit Added or deleted any medications: No Patient Arrived: Wheel Chair Any new allergies or adverse reactions: No Arrival Time: 13:25 Had a fall or experienced change in No Accompanied By: self activities of daily living that may affect Transfer Assistance: None risk of falls: Patient Identification Verified: Yes Signs or symptoms of abuse/neglect since last visito No Secondary Verification Process Completed: Yes Hospitalized since last visit: No Patient Requires Transmission-Based Precautions: No Implantable device outside of the clinic excluding No Patient Has Alerts: No cellular tissue based products placed in the center since last visit: Has Dressing in Place as Prescribed: Yes Has Footwear/Offloading in Place as Prescribed: Yes Left: T Contact Cast otal Pain Present Now: No Electronic Signature(s) Signed: 04/09/2021 6:06:32 PM By: Deon Pilling Entered By: Deon Pilling on 04/09/2021 13:26:15 -------------------------------------------------------------------------------- Clinic Level of Care Assessment Details Patient Name: Date of Service: [name] [name], [name] [name] E. 04/09/2021 1:00 PM Medical Record Number: [id number] Patient Account Number: [id number] Date of Birth/Sex: Treating RN: [birth year] (75 y.o. [name] [name], [name] [name] Primary Care [name] [name]: [name] Other Clinician: Referring Marliss Buttacavoli: Treating [name] [name]/Extender: Adele Schilder in Treatment: 13 Clinic Level of Care Assessment  Items TOOL 4 Quantity Score X- 1 0 Use when only an EandM is performed on FOLLOW-UP visit ASSESSMENTS - Nursing Assessment / Reassessment X- 1 10 Reassessment of Co-morbidities (includes updates in patient status) X- 1 5 Reassessment of Adherence to Treatment Plan ASSESSMENTS - Wound and Skin A ssessment / Reassessment X - Simple Wound Assessment / Reassessment - one wound 1 5 '[]'$  - 0 Complex Wound Assessment / Reassessment - multiple wounds X- 1 10 Dermatologic / Skin Assessment (not related to wound area) ASSESSMENTS - Focused Assessment X- 1 5 Circumferential Edema Measurements - multi extremities X- 1 10 Nutritional Assessment / Counseling / Intervention '[]'$  - 0 Lower Extremity Assessment (monofilament, tuning fork, pulses) '[]'$  - 0 Peripheral Arterial Disease Assessment (using hand held doppler) ASSESSMENTS - Ostomy and/or Continence Assessment and Care '[]'$  - 0 Incontinence Assessment and Management '[]'$  - 0 Ostomy Care Assessment and Management (repouching, etc.) PROCESS - Coordination of Care X - Simple Patient / Family Education for ongoing care 1 15 '[]'$  - 0 Complex (extensive) Patient / Family Education for ongoing care X- 1 10 Staff obtains Programmer, systems, Records, T Results / Process Orders est '[]'$  - 0 Staff telephones HHA, Nursing Homes / Clarify orders / etc '[]'$  - 0 Routine Transfer to another Facility (non-emergent condition) '[]'$  - 0 Routine Hospital Admission (non-emergent condition) '[]'$  - 0 New Admissions / Biomedical engineer / Ordering NPWT Apligraf, etc. , '[]'$  - 0 Emergency Hospital Admission (emergent condition) X- 1 10 Simple Discharge Coordination '[]'$  - 0 Complex (extensive) Discharge Coordination PROCESS - Special Needs '[]'$  - 0 Pediatric / Minor Patient Management '[]'$  - 0 Isolation Patient Management '[]'$  - 0 Hearing / Language / Visual special needs '[]'$  - 0 Assessment of Community assistance (transportation, D/C planning, etc.) '[]'$  - 0 Additional  assistance / Altered mentation '[]'$  - 0 Support Surface(s) Assessment (bed, cushion, seat, etc.) INTERVENTIONS - Wound Cleansing / Measurement X - Simple Wound  Cleansing - one wound 1 5 '[]'$  - 0 Complex Wound Cleansing - multiple wounds X- 1 5 Wound Imaging (photographs - any number of wounds) '[]'$  - 0 Wound Tracing (instead of photographs) X- 1 5 Simple Wound Measurement - one wound '[]'$  - 0 Complex Wound Measurement - multiple wounds INTERVENTIONS - Wound Dressings X - Small Wound Dressing one or multiple wounds 1 10 '[]'$  - 0 Medium Wound Dressing one or multiple wounds '[]'$  - 0 Large Wound Dressing one or multiple wounds '[]'$  - 0 Application of Medications - topical '[]'$  - 0 Application of Medications - injection INTERVENTIONS - Miscellaneous '[]'$  - 0 External ear exam '[]'$  - 0 Specimen Collection (cultures, biopsies, blood, body fluids, etc.) '[]'$  - 0 Specimen(s) / Culture(s) sent or taken to Lab for analysis '[]'$  - 0 Patient Transfer (multiple staff / Civil Service fast streamer / Similar devices) '[]'$  - 0 Simple Staple / Suture removal (25 or less) '[]'$  - 0 Complex Staple / Suture removal (26 or more) '[]'$  - 0 Hypo / Hyperglycemic Management (close monitor of Blood Glucose) '[]'$  - 0 Ankle / Brachial Index (ABI) - do not check if billed separately X- 1 5 Vital Signs Has the patient been seen at the hospital within the last three years: Yes Total Score: 110 Level Of Care: New/Established - Level 3 Electronic Signature(s) Signed: 04/09/2021 6:06:32 PM By: Deon Pilling Entered By: Deon Pilling on 04/09/2021 14:15:48 -------------------------------------------------------------------------------- Encounter Discharge Information Details Patient Name: Date of Service: [name] [name] [name] E. 04/09/2021 1:00 PM Medical Record Number: [id number] Patient Account Number: [id number] Date of Birth/Sex: Treating RN: 08/31/1945 (75 y.o. [name] [name] Primary Care Jasleen Riepe: [name] Other Clinician: Referring  Annie Roseboom: Treating Taquana Bartley/Extender: Adele Schilder in Treatment: 13 Encounter Discharge Information Items Discharge Condition: Stable Ambulatory Status: Wheelchair Discharge Destination: Home Transportation: Private Auto Accompanied By: self Schedule Follow-up Appointment: No Clinical Summary of Care: Notes foam dressing covered closed area. Electronic Signature(s) Signed: 04/09/2021 6:06:32 PM By: Deon Pilling Entered By: Deon Pilling on 04/09/2021 14:17:25 -------------------------------------------------------------------------------- Lower Extremity Assessment Details Patient Name: Date of Service: [name] [name], [name] [name] E. 04/09/2021 1:00 PM Medical Record Number: [id number] Patient Account Number: [id number] Date of Birth/Sex: Treating RN: [birth year] (75 y.o. [name] [name] Primary Care Nassir Neidert: [name] Other Clinician: Referring Xaniyah Buchholz: Treating Cutberto Winfree/Extender: Landis Martins Weeks in Treatment: 13 Edema Assessment Assessed: [Left: Yes] [Right: No] Edema: [Left: N] [Right: o] Calf Left: Right: Point of Measurement: 30 cm From Medial Instep 35 cm Ankle Left: Right: Point of Measurement: 9 cm From Medial Instep 23 cm Vascular Assessment Pulses: Dorsalis Pedis Palpable: [Left:Yes] Electronic Signature(s) Signed: 04/09/2021 6:06:32 PM By: Deon Pilling Entered By: Deon Pilling on 04/09/2021 13:36:32 -------------------------------------------------------------------------------- [name] [name] Details Patient Name: Date of Service: [name] [name] [name] E. 04/09/2021 1:00 PM Medical Record Number: [id number] Patient Account Number: [id number] Date of Birth/Sex: Treating RN: [birth year] (75 y.o. [name] [name] Primary Care Rhylie Stehr: [name] Other Clinician: Referring Arseniy Toomey: Treating Alantis Bethune/Extender: Adele Schilder in Treatment: [place] reviewed with  physician Active Inactive Electronic Signature(s) Signed: 05/08/2021 5:43:51 PM By: Deon Pilling RN, BSN Signed: 05/08/2021 6:02:00 PM By: [name] RN, BSN Previous Signature: 04/09/2021 6:06:32 PM Version By: Deon Pilling Entered By: [name] on 04/22/2021 16:49:02 -------------------------------------------------------------------------------- Pain Assessment Details Patient Name: Date of Service: [name] [name] [name] E. 04/09/2021 1:00 PM Medical Record Number: [id number] Patient Account Number: [id number] Date of Birth/Sex: Treating RN:  06/24/1946 (75 y.o. [name] [name] Primary Care Bradin Mcadory: [name] Other Clinician: Referring Ruford Dudzinski: Treating Waleska Buttery/Extender: Adele Schilder in Treatment: 13 Active Problems Location of Pain Severity and Description of Pain Patient Has Paino No Site Locations Rate the pain. Rate the pain. Current Pain Level: 0 Pain Management and Medication Current Pain Management: Medication: No Cold Application: No Rest: No Massage: No Activity: No T.[name]: No Heat Application: No Leg drop or elevation: No Is the Current Pain Management Adequate: Adequate How does your wound impact your activities of daily livingo Sleep: No Bathing: No Appetite: No Relationship With Others: No Bladder Continence: No Emotions: No Bowel Continence: No Work: No Toileting: No Drive: No Dressing: No Hobbies: No Electronic Signature(s) Signed: 04/09/2021 6:06:32 PM By: Deon Pilling Entered By: Deon Pilling on 04/09/2021 13:26:26 -------------------------------------------------------------------------------- Patient/Caregiver Education Details Patient Name: Date of Service: [name] [name] [name] E. 8/31/2022andnbsp1:00 PM Medical Record Number: [id number] Patient Account Number: [id number] Date of Birth/Gender: Treating RN: October 06, 1945 (75 y.o. [name] [name] Primary Care Physician: [name] Other  Clinician: Referring Physician: Treating Physician/Extender: Adele Schilder in Treatment: 13 Education Assessment Education Provided To: Patient Education Topics Provided Wound/Skin Impairment: Handouts: Skin Care Do's and Dont's Methods: Explain/Verbal Responses: Reinforcements needed Electronic Signature(s) Signed: 04/09/2021 6:06:32 PM By: Deon Pilling Entered By: Deon Pilling on 04/09/2021 13:52:21 -------------------------------------------------------------------------------- Wound Assessment Details Patient Name: Date of Service: [name] [name] [name] E. 04/09/2021 1:00 PM Medical Record Number: [id number] Patient Account Number: [id number] Date of Birth/Sex: Treating RN: 22-Mar-1946 (75 y.o. [name] [name], [name] [name] Primary Care Keanna Tugwell: [name] Other Clinician: Referring Jamarcus Laduke: Treating Jaicion Laurie/Extender: Landis Martins Weeks in Treatment: 13 Wound Status Wound Number: [id number] Primary Diabetic Wound/Ulcer of the Lower Extremity Etiology: Wound Location: Left, Medial, Plantar Foot Wound Healed - Epithelialized Wounding Event: Gradually Appeared Status: Date Acquired: 05/11/2019 Comorbid Sleep Apnea, Deep Vein Thrombosis, Hypertension, Colitis, Type Weeks Of Treatment: 64 History: II Diabetes, Gout, Osteoarthritis, Neuropathy Clustered Wound: No Photos Wound Measurements Length: (cm) Width: (cm) Depth: (cm) Area: (cm) Volume: (cm) 0 % Reduction in Area: 100% 0 % Reduction in Volume: 100% 0 Epithelialization: Large (67-100%) 0 Tunneling: No 0 Undermining: No Wound Description Classification: Grade 1 Wound Margin: Distinct, outline attached Exudate Amount: None Present Foul Odor After Cleansing: No Slough/Fibrino No Wound Bed Granulation Amount: None Present (0%) Exposed Structure Necrotic Amount: None Present (0%) Fascia Exposed: No Fat Layer (Subcutaneous Tissue) Exposed: Yes Tendon Exposed: No Muscle Exposed: No Joint  Exposed: No Bone Exposed: No Assessment Notes callous noted. Electronic Signature(s) Signed: 04/09/2021 6:06:32 PM By: Deon Pilling Entered By: Deon Pilling on 04/09/2021 14:12:41 -------------------------------------------------------------------------------- Vitals Details Patient Name: Date of Service: [name] [name], [name] [name] [name] E. 04/09/2021 1:00 PM Medical Record Number: [id number] Patient Account Number: [id number] Date of Birth/Sex: Treating RN: 09-14-1945 (75 y.o. [name] [name] Primary Care Vetra Shinall: [name] Other Clinician: Referring Monique Gift: Treating Eulas Schweitzer/Extender: Landis Martins Weeks in Treatment: 13 Vital Signs Time Taken: 13:25 Temperature (F): 99.7 Pulse (bpm): 88 Respiratory Rate (breaths/min): 16 Blood Pressure (mmHg): 118/59 Reference Range: 80 - 120 mg / dl Electronic Signature(s) Signed: 04/09/2021 6:06:32 PM By: Deon Pilling Entered By: Deon Pilling on 04/09/2021 13:27:19

## 2021-04-14 ENCOUNTER — Encounter: Payer: Self-pay | Admitting: Internal Medicine

## 2021-04-14 ENCOUNTER — Encounter: Payer: Self-pay | Admitting: Cardiovascular Disease

## 2021-04-15 ENCOUNTER — Encounter: Payer: Self-pay | Admitting: Internal Medicine

## 2021-04-16 ENCOUNTER — Encounter (HOSPITAL_BASED_OUTPATIENT_CLINIC_OR_DEPARTMENT_OTHER): Payer: Medicare Other | Admitting: Physician Assistant

## 2021-04-16 ENCOUNTER — Other Ambulatory Visit: Payer: Self-pay | Admitting: Internal Medicine

## 2021-04-16 ENCOUNTER — Telehealth: Payer: Self-pay

## 2021-04-16 MED ORDER — BLOOD GLUCOSE MONITOR KIT: PACK | 0 refills | Status: DC

## 2021-04-16 NOTE — Telephone Encounter (Signed)
Sent to [facility].

## 2021-04-16 NOTE — Telephone Encounter (Signed)
Patient called the office. Stated her glucose levels have been over 200 for the past 3 days. Over the past 3 days she has been watching her food intake to see if that will improve it. She is taking the medication as prescribed but nothing is lowering it.   Requesting advise and callback.

## 2021-04-16 NOTE — Telephone Encounter (Signed)
Patient requesting a new order for a glucose meter.

## 2021-04-17 ENCOUNTER — Telehealth: Payer: Self-pay | Admitting: Internal Medicine

## 2021-04-17 NOTE — Telephone Encounter (Signed)
I would like to refer her to the post covid clinic.  I tried calling to refer her, but I had a voicemail.  When you are able can you call and get her set up with them.  Their number is [phone]

## 2021-04-17 NOTE — Telephone Encounter (Signed)
Called and left message on voicemail since they were currently not open.  Left details with patient's info and symptoms and my contact number

## 2021-04-20 ENCOUNTER — Other Ambulatory Visit: Payer: Self-pay | Admitting: Internal Medicine

## 2021-04-21 ENCOUNTER — Encounter: Payer: Self-pay | Admitting: Internal Medicine

## 2021-04-21 ENCOUNTER — Other Ambulatory Visit: Payer: Self-pay | Admitting: Internal Medicine

## 2021-04-22 ENCOUNTER — Telehealth: Payer: Self-pay | Admitting: *Deleted

## 2021-04-22 DIAGNOSIS — Z23 Encounter for immunization: Secondary | ICD-10-CM | POA: Diagnosis not present

## 2021-04-22 NOTE — Telephone Encounter (Signed)
Returned a call to the patient informing her that I cannot order the machine that [facility] is recommending without a order from Dr. Claiborne Billings. The machine that he is ordering is  not the same as what they are suggesting. Dr. Claiborne Billings is out on vacation right now. I will have to wait until he returns from vacation to see if he agrees to order the recommended machine or go with his initial order. Patient voiced understanding and will continue to use the loaner machine until she hears back from [place].

## 2021-04-23 ENCOUNTER — Encounter (HOSPITAL_BASED_OUTPATIENT_CLINIC_OR_DEPARTMENT_OTHER): Payer: Medicare Other | Admitting: Physician Assistant

## 2021-04-23 MED ORDER — LEVEMIR FLEXTOUCH 100 UNIT/ML ~~LOC~~ SOPN: 22.0000 [IU] | PEN_INJECTOR | Freq: Two times a day (BID) | SUBCUTANEOUS | 3 refills | Status: DC

## 2021-04-24 ENCOUNTER — Other Ambulatory Visit: Payer: Self-pay

## 2021-04-24 ENCOUNTER — Encounter: Payer: Self-pay | Admitting: Nurse Practitioner

## 2021-04-24 ENCOUNTER — Ambulatory Visit (INDEPENDENT_AMBULATORY_CARE_PROVIDER_SITE_OTHER): Payer: Medicare Other | Admitting: Nurse Practitioner

## 2021-04-24 VITALS — BP 121/59 | HR 105 | Temp 97.7°F | Resp 18 | Ht 65.98 in | Wt 227.3 lb

## 2021-04-24 DIAGNOSIS — R066 Hiccough: Secondary | ICD-10-CM

## 2021-04-24 DIAGNOSIS — R5381 Other malaise: Secondary | ICD-10-CM

## 2021-04-24 DIAGNOSIS — Z8616 Personal history of COVID-19: Secondary | ICD-10-CM

## 2021-04-24 DIAGNOSIS — R439 Unspecified disturbances of smell and taste: Secondary | ICD-10-CM | POA: Diagnosis not present

## 2021-04-24 DIAGNOSIS — R413 Other amnesia: Secondary | ICD-10-CM

## 2021-04-24 MED ORDER — CHLORPROMAZINE HCL 10 MG PO TABS: 10.0000 mg | ORAL_TABLET | Freq: Three times a day (TID) | ORAL | 0 refills | Status: DC

## 2021-04-24 NOTE — Patient Instructions (Addendum)
History of Covid 19 Fatigue Altered taste and smell Memory loss Anxiety Chronic hiccups:   Stay well hydrated  Stay active  Deep breathing exercises  May take tylenol for fever or pain  Will place referral to PT  Will place referral to speech  Will place referral to neurology  Olfactory retraining encouraged: essential oils of lemon, rose, clove, and eucalyptus     Follow up:  Follow up in 2 months or sooner if needed

## 2021-04-24 NOTE — Progress Notes (Signed)
Pt presents for long haul Covid symptoms

## 2021-04-24 NOTE — Progress Notes (Signed)
$'@Patient'K$  ID: [name] [name], female    DOB: [birth year], 75 y.o.   MRN: [id number]  Chief Complaint  Patient presents with   Post Covid    Referring provider: Binnie Rail, MD   HPI  Patient presents today for post-COVID care clinic visit.  Patient tested positive for COVID in November 2020 and was hospitalized at that time.  She has followed with cardiology.  Patient complains of ongoing altered taste and smell, hiccups, memory loss, fatigue, anxiety.  Patient is currently on anxiety anxiety medications by PCP.  We discussed that we can place referral to neurology and speech therapy for cognitive rehabilitation and further evaluation of altered taste and smell.  Patient will need a physical therapy referral for fatigue. Denies f/c/s, n/v/d, hemoptysis, PND, chest pain or edema.      Allergies  Allergen Reactions   Other Shortness Of Breath    Blue fish: palms and feet turn red   Bee Venom Swelling   Farxiga [Dapagliflozin]     Recurrent yeast infections   Semaglutide     Rybelsus - lip swelling, GI upset   Sulfa Antibiotics Hives    Immunization History  Administered Date(s) Administered   Fluad Quad(high Dose 65+) 05/20/2020   Influenza Split 04/11/2018   Influenza, High Dose Seasonal PF 04/27/2016, 04/29/2017, 05/09/2019   Influenza,inj,Quad PF,6+ Mos 05/18/2014, 05/11/2019   Influenza,inj,quad, With Preservative 04/12/2017   Influenza-Unspecified 05/10/2014   Moderna Sars-Covid-2 Vaccination 09/09/2019, 10/07/2019, 04/09/2020   Pneumococcal Conjugate-13 10/02/2014   Pneumococcal Polysaccharide-23 11/20/2010, 07/28/2018   Td 08/10/2008, 09/13/2018   Unspecified SARS-COV-2 Vaccination 10/10/2019   Zoster Recombinat (Shingrix) 02/17/2018, 05/26/2018    Past Medical History:  Diagnosis Date   Arthritis    Closed fracture of fifth metatarsal bone 05/13/2018   Deep vein thrombosis ([place])    right calf - 05/2012    Diabetes mellitus without complication (HCC)     diet controlled    GERD (gastroesophageal reflux disease)    Hyperlipidemia    Hypertension    Ejection fraction =>55% Left ventricular systolic function is normal. Left ventricular wall motion is normal     Lymphocytic colitis    Neuropathy    diabetic - in bilateral feet    Pityriasis lichenoides chronica    Sleep apnea    bipap    Tobacco History: Social History   Tobacco Use  Smoking Status Never  Smokeless Tobacco Never   Counseling given: Yes   Outpatient Encounter Medications as of 04/24/2021  Medication Sig   chlorproMAZINE (THORAZINE) 10 MG tablet Take 1 tablet (10 mg total) by mouth 3 (three) times daily for 10 days.   acetaminophen (TYLENOL) 325 MG tablet Take 2 tablets (650 mg total) by mouth every 6 (six) hours as needed for mild pain or headache.   Alcohol Swabs PADS UAD to check sugars.  E11.65   allopurinol (ZYLOPRIM) 100 MG tablet TAKE 1 TABLET(100 MG) BY MOUTH TWICE DAILY   ALPRAZolam (XANAX) 0.25 MG tablet TAKE 1 TABLET(0.25 MG) BY MOUTH TWICE DAILY AS NEEDED FOR ANXIETY   ascorbic acid (VITAMIN C) 500 MG tablet Take 500 mg by mouth at bedtime.   aspirin 81 MG tablet Take 81 mg by mouth See admin instructions. Every 3 days   azelastine (ASTELIN) 0.1 % nasal spray INSTILL 2 SPRAYS IN EACH NOSTRIL TWICE DAILY   Blood Glucose Monitoring Suppl (ACCU-CHEK GUIDE) w/Device KIT AS DIRECTED   Budesonide 9 MG TB24 Take 9 mg by mouth daily.  carvedilol (COREG) 6.25 MG tablet TAKE 1 TABLET(6.25 MG) BY MOUTH TWICE DAILY WITH A MEAL   Certolizumab Pegol (CIMZIA [place]) Inject 400 mg into the skin every 30 (thirty) days. 200 mg on each side of stomach   Cholecalciferol (VITAMIN D3) 1000 UNITS CAPS Take 1,000 Units by mouth 2 (two) times daily.    Cyanocobalamin (VITAMIN B-12) 1000 MCG SUBL Place 1,000 mcg under the tongue daily.   cyclobenzaprine (FLEXERIL) 10 MG tablet TAKE 1 TABLET BY MOUTH AT BEDTIME   folic acid (FOLVITE) 1 MG tablet TAKE 1 TABLET(1 MG) BY MOUTH DAILY    furosemide (LASIX) 20 MG tablet TAKE 1 TABLET(20 MG) BY MOUTH DAILY   Insulin Pen Needle (B-D UF III MINI PEN NEEDLES) 31G X 5 MM MISC Use daily for insulin pen   LEVEMIR FLEXTOUCH 100 UNIT/ML FlexTouch Pen Inject 22 Units into the skin 2 (two) times daily.   lisinopril-hydrochlorothiazide (ZESTORETIC) 20-25 MG tablet TAKE 1 TABLET BY MOUTH DAILY   loperamide (IMODIUM) 2 MG capsule Take 6 mg by mouth 2 (two) times daily.   Melatonin 5 MG SUBL Place 5 mg under the tongue at bedtime.   metFORMIN (GLUCOPHAGE-XR) 500 MG 24 hr tablet Take 1 tablet (500 mg total) by mouth 2 (two) times daily before a meal.   methotrexate (RHEUMATREX) 2.5 MG tablet Take 20 mg by mouth every Friday.    naftifine (NAFTIN) 1 % cream Apply 1 application topically daily as needed (rash).    omeprazole (PRILOSEC) 40 MG capsule TAKE 1 CAPSULE BY MOUTH EVERY DAY   potassium chloride (KLOR-CON) 10 MEQ tablet Take 3 tablets (30 mEq total) by mouth daily.   prednisoLONE acetate (PRED FORTE) 1 % ophthalmic suspension Place 4 drops into the right eye See admin instructions. 4 drops  daily for 1 week, 2 drops daily until post op. Surgery 07/11/20   Probiotic Product (ALIGN PO) Take 1 capsule by mouth daily.   rosuvastatin (CRESTOR) 5 MG tablet TAKE 1 TABLET(5 MG) BY MOUTH DAILY   rosuvastatin (CRESTOR) 5 MG tablet TAKE 1 TABLET(5 MG) BY MOUTH DAILY   sertraline (ZOLOFT) 50 MG tablet Take 1 tablet (50 mg total) by mouth daily.   STOOL SOFTENER 100 MG capsule TAKE 1 CAPSULE(100 MG) BY MOUTH TWICE DAILY   triamcinolone cream (KENALOG) 0.1 % Apply 1 application topically 2 (two) times daily.   [DISCONTINUED] glucose blood (GLUCOSE METER TEST) test strip Verio Flex One Touch.  Use as instructed. E11.9   No facility-administered encounter medications on file as of 04/24/2021.     Review of Systems  Review of Systems  Constitutional:  Positive for fatigue.  HENT: Negative.    Cardiovascular: Negative.   Gastrointestinal: Negative.    Allergic/Immunologic: Negative.   Neurological: Negative.   Psychiatric/Behavioral:  Positive for decreased concentration. The patient is nervous/anxious.       Physical Exam  BP (!) 121/59 (BP Location: Left Arm, Patient Position: Sitting, Cuff Size: Large)   Pulse (!) 105   Temp 97.7 F (36.5 C)   Resp 18   Ht 5' 5.98" (1.676 m)   Wt 227 lb 4.8 oz (103.1 kg)   SpO2 97%   BMI 36.70 kg/m   Wt Readings from Last 5 Encounters:  04/24/21 227 lb 4.8 oz (103.1 kg)  04/07/21 249 lb (112.9 kg)  03/12/20 245 lb (111.1 kg)  01/12/20 245 lb (111.1 kg)  07/13/19 268 lb 15.4 oz (122 kg)     Physical Exam Vitals and nursing note  reviewed.  Constitutional:      General: She is not in acute distress.    Appearance: She is well-developed.  Cardiovascular:     Rate and Rhythm: Normal rate and regular rhythm.  Pulmonary:     Effort: Pulmonary effort is normal.     Breath sounds: Normal breath sounds.  Neurological:     Mental Status: She is alert and oriented to person, place, and time.     Lab Results:  CBC    Component Value Date/Time   WBC 9.3 11/15/2020 1202   RBC 3.53 (L) 11/15/2020 1202   HGB 10.5 (L) 11/15/2020 1202   HGB 12.4 01/13/2019 1403   HCT 31.7 (L) 11/15/2020 1202   HCT 36.9 01/13/2019 1403   PLT 200.0 11/15/2020 1202   PLT 307 01/13/2019 1403   MCV 89.8 11/15/2020 1202   MCV 87 01/13/2019 1403   MCH 30.1 07/17/2020 0343   MCHC 33.0 11/15/2020 1202   RDW 21.4 (H) 11/15/2020 1202   RDW 15.3 01/13/2019 1403   LYMPHSABS 2.0 11/15/2020 1202   MONOABS 0.5 11/15/2020 1202   EOSABS 0.1 11/15/2020 1202   BASOSABS 0.0 11/15/2020 1202    BMET    Component Value Date/Time   NA 136 11/15/2020 1202   NA 136 01/13/2019 1403   K 3.3 (L) 11/15/2020 1202   CL 96 11/15/2020 1202   CO2 31 11/15/2020 1202   GLUCOSE 220 (H) 11/15/2020 1202   BUN 12 11/15/2020 1202   BUN 20 01/13/2019 1403   CREATININE 0.63 11/15/2020 1202   CALCIUM 9.2 11/15/2020 1202    GFRNONAA 44 (L) 07/17/2020 0343   GFRAA >60 03/12/2020 1747    BNP    Component Value Date/Time   BNP 47.0 07/07/2019 1956    ProBNP    Component Value Date/Time   PROBNP 368.0 (H) 07/23/2020 1546    Imaging: No results found.   Assessment & Plan:   History of COVID-19 Fatigue Altered taste and smell Memory loss Anxiety Chronic hiccups:   Stay well hydrated  Stay active  Deep breathing exercises  May take tylenol for fever or pain  Will place referral to PT  Will place referral to speech  Will place referral to neurology  Olfactory retraining encouraged: essential oils of lemon, rose, clove, and eucalyptus     Follow up:  Follow up in 2 months or sooner if needed     Fenton Foy, NP 05/01/2021

## 2021-04-27 ENCOUNTER — Encounter: Payer: Self-pay | Admitting: Internal Medicine

## 2021-04-27 MED ORDER — ACCU-CHEK GUIDE VI STRP: ORAL_STRIP | 12 refills | Status: DC

## 2021-04-27 MED ORDER — ACCU-CHEK SOFTCLIX LANCETS MISC: 12 refills | Status: DC

## 2021-04-27 NOTE — Addendum Note (Signed)
Addended by: Binnie Rail on: 04/27/2021 06:51 PM   Modules accepted: Orders

## 2021-05-01 ENCOUNTER — Encounter: Payer: Self-pay | Admitting: Nurse Practitioner

## 2021-05-01 DIAGNOSIS — R413 Other amnesia: Secondary | ICD-10-CM | POA: Insufficient documentation

## 2021-05-01 DIAGNOSIS — L4059 Other psoriatic arthropathy: Secondary | ICD-10-CM | POA: Diagnosis not present

## 2021-05-01 DIAGNOSIS — Z8616 Personal history of COVID-19: Secondary | ICD-10-CM | POA: Insufficient documentation

## 2021-05-01 DIAGNOSIS — R066 Hiccough: Secondary | ICD-10-CM | POA: Insufficient documentation

## 2021-05-01 DIAGNOSIS — R439 Unspecified disturbances of smell and taste: Secondary | ICD-10-CM | POA: Insufficient documentation

## 2021-05-01 NOTE — Assessment & Plan Note (Signed)
Fatigue Altered taste and smell Memory loss Anxiety Chronic hiccups:   Stay well hydrated  Stay active  Deep breathing exercises  May take tylenol for fever or pain  Will place referral to PT  Will place referral to speech  Will place referral to neurology  Olfactory retraining encouraged: essential oils of lemon, rose, clove, and eucalyptus     Follow up:  Follow up in 2 months or sooner if needed

## 2021-05-08 ENCOUNTER — Encounter: Payer: Self-pay | Admitting: Internal Medicine

## 2021-05-08 DIAGNOSIS — R519 Headache, unspecified: Secondary | ICD-10-CM

## 2021-05-08 DIAGNOSIS — R471 Dysarthria and anarthria: Secondary | ICD-10-CM

## 2021-05-08 DIAGNOSIS — G8929 Other chronic pain: Secondary | ICD-10-CM

## 2021-05-09 ENCOUNTER — Ambulatory Visit: Payer: Medicare Other | Admitting: Speech Pathology

## 2021-05-09 ENCOUNTER — Ambulatory Visit: Payer: Medicare Other | Admitting: Rehabilitative and Restorative Service Providers"

## 2021-05-13 ENCOUNTER — Encounter: Payer: Self-pay | Admitting: Physical Therapy

## 2021-05-13 ENCOUNTER — Other Ambulatory Visit: Payer: Self-pay

## 2021-05-13 ENCOUNTER — Ambulatory Visit: Payer: Medicare Other | Admitting: Physical Therapy

## 2021-05-13 ENCOUNTER — Ambulatory Visit: Payer: Medicare Other | Attending: Nurse Practitioner | Admitting: Speech Pathology

## 2021-05-13 DIAGNOSIS — M6281 Muscle weakness (generalized): Secondary | ICD-10-CM | POA: Insufficient documentation

## 2021-05-13 DIAGNOSIS — R262 Difficulty in walking, not elsewhere classified: Secondary | ICD-10-CM | POA: Insufficient documentation

## 2021-05-13 DIAGNOSIS — R278 Other lack of coordination: Secondary | ICD-10-CM | POA: Insufficient documentation

## 2021-05-13 DIAGNOSIS — R41841 Cognitive communication deficit: Secondary | ICD-10-CM | POA: Insufficient documentation

## 2021-05-13 NOTE — Patient Instructions (Signed)
Access Code: [email or web address] URL: [email or web address] Prepared by: Ethel Rana  Exercises Mini Squat with Counter Support - 1 x daily - 7 x weekly - 1 sets - 10 reps Standing Heel Raise with Support - 1 x daily - 7 x weekly - 1 sets - 10 reps Standing Hip Abduction - 1 x daily - 7 x weekly - 1 sets - 10 reps Standing March with Counter Support - 1 x daily - 7 x weekly - 1 sets - 10 reps

## 2021-05-13 NOTE — Therapy (Signed)
[place] [place], [place], [address] Phone: [phone]   Fax:  [phone]  Physical Therapy Evaluation  Patient Details  Name: [name] [name] MRN: [id number] Date of Birth: 02-Jul-1946 No data recorded  Encounter Date: 05/13/2021   PT End of Session - 05/13/21 0859     Visit Number 1    Number of Visits 13    Date for PT Re-Evaluation 06/24/21    PT Start Time 0804    PT Stop Time 0845    PT Time Calculation (min) 41 min    Activity Tolerance Patient limited by pain    Behavior During Therapy [facility] for tasks assessed/performed             Past Medical History:  Diagnosis Date   Arthritis    Closed fracture of fifth metatarsal bone 05/13/2018   Deep vein thrombosis ([place])    right calf - 05/2012    Diabetes mellitus without complication ([place])    diet controlled    GERD (gastroesophageal reflux disease)    Hyperlipidemia    Hypertension    Ejection fraction =>55% Left ventricular systolic function is normal. Left ventricular wall motion is normal     Lymphocytic colitis    Neuropathy    diabetic - in bilateral feet    Pityriasis lichenoides chronica    Sleep apnea    bipap    Past Surgical History:  Procedure Laterality Date   DILATION AND CURETTAGE OF UTERUS     EYE SURGERY  07/2020   HAMMER TOE SURGERY     right hand surgery      due to blood infection    torn meniscus repair      right knee    TOTAL HIP ARTHROPLASTY  09/13/2012   Procedure: TOTAL HIP ARTHROPLASTY ANTERIOR APPROACH;  Surgeon: Mauri Pole, MD;  Location: [facility];  Service: Orthopedics;  Laterality: Right;    There were no vitals filed for this visit.    Subjective Assessment - 05/13/21 0809     Subjective Patient reports H/O Covid in 2020, since that time has had issues with swallowing, no taste, had wound on bottom of L foot, which has recently healed, but left her very weak due to inactivity for > year, immune  deificiency diseases    Pertinent History Covid 2020, B PN, swallowing issues    How long can you sit comfortably? no limit    How long can you stand comfortably? Approximately5-10 min    How long can you walk comfortably? Had been in W/C mostly up until last week, approximately 200'    Patient Stated Goals Walk without AD for longer distances.    Currently in Pain? Yes    Pain Score 3     Pain Location Back    Pain Orientation Lower;Left;Right    Pain Descriptors / Indicators Aching    Pain Type Chronic pain    Pain Onset More than a month ago    Pain Frequency Intermittent    Aggravating Factors  bending, standing for long periods    Pain Relieving Factors Salon pas, tylenol    Effect of Pain on Daily Activities limits all activity                [facility] PT Assessment - 05/13/21 0001       Balance Screen   Has the patient fallen in the past 6 months Yes    How many  times? 1-W/C not locked and rolled away      [place] residence    Living Arrangements Spouse/significant other    Available Help at Discharge Family    Type of [place] to enter    Entrance Stairs-Number of Steps 5    Entrance Stairs-Rails Can reach both    [place] to live on main level with bedroom/bathroom    [place] [employer] - single point;Walker - 2 wheels;Wheelchair - [employer];Shower seat;Other (comment)      Prior Function   Level of Independence Independent    Leisure , read, travel, theater      Sensation   Light Touch Impaired by gross assessment      ROM / Strength   AROM / PROM / Strength AROM;Strength      AROM   Overall AROM  Within functional limits for tasks performed      Strength   Overall Strength Deficits    Strength Assessment Site Shoulder;Elbow;Hip;Knee;Ankle    Right/Left Shoulder Right;Left    Right Shoulder Flexion 4-/5    Left Shoulder Flexion 4-/5    Right/Left Elbow Right;Left     Right Elbow Flexion 4-/5    Right Elbow Extension 4-/5    Left Elbow Flexion 4-/5    Left Elbow Extension 4-/5    Right/Left Hip Right;Left    Right Hip Flexion 4-/5    Right Hip Extension 3+/5    Left Hip Flexion 4-/5    Left Hip Extension 3+/5    Right/Left Knee Left;Right    Right Knee Flexion 4-/5    Right Knee Extension 4-/5    Left Knee Flexion 4-/5    Left Knee Extension 4-/5    Right/Left Ankle Right;Left    Right Ankle Dorsiflexion 3+/5    Right Ankle Plantar Flexion 3/5    Left Ankle Dorsiflexion 3+/5    Left Ankle Plantar Flexion 3/5      Transfers   Transfers Sit to Stand    Five time sit to stand comments  21 seconds, elevated surface, pushed back against table.    Comments needs armrests to push up from during sit to stand      Ambulation/Gait   Ambulation/Gait Yes    Ambulation/Gait Assistance 6: Modified independent (Device/Increase time)    Ambulation Distance (Feet) 200 Feet    Assistive device Straight cane    Gait Comments Patient walks iwth wide BOS, increaesd lateral sway, slow and effortful      High Level Balance   High Level Balance Comments Patient reports unsteadienss, fear of falling, pushes back of legs on seating surface to rise and maintains wide BOS in stand                        Objective measurements completed on examination: See above findings.                PT Education - 05/13/21 0859     Education Details POC, HEP    Person(s) Educated Patient;Spouse    Methods Explanation;Demonstration;Handout    Comprehension Verbalized understanding              PT Short Term Goals - 05/13/21 0909       PT SHORT TERM GOAL #1   Title I with basic HEP    Baseline Provided with handouts, reviewed    Time 3  Period Weeks    Status New    Target Date 06/03/21               PT Long Term Goals - 05/13/21 0910       PT LONG TERM GOAL #1   Title Improve motor strength in BLE to 4+/5, with good  endurance to tolerate at least 2 x 20 reps of all strenht exercises.    Baseline 3+-(4-)/5    Time 6    Period Weeks    Status New    Target Date 06/24/21      PT LONG TERM GOAL #2   Title Improve 5x sit to stand time to 12 seconds from normal test height surface, in order to improve ability to rise from restaraunt chairs iwthout use of armrests.    Baseline 21 sec from elevated sureface with compensations.    Time 6    Period Weeks    Status New    Target Date 06/24/21      PT LONG TERM GOAL #3   Title Patient will ambulate functionally x at least 500', on level and unlevel surfaces with LRAD, MI.    Baseline Max 200' with cane, MI, level surfaces, unsteady, slow.    Time 6    Period Weeks    Target Date 06/24/21      PT LONG TERM GOAL #4   Title Patient will tolerate sitting/ standing activities during 45 minute treatment, with maximum of 4 rest breaks, with no C/O back pain or fatigue.    Baseline Reported incresed back pain and fatigue after 30 minutes of minimal activity and sitting.      PT LONG TERM GOAL #5   Title Up and down at least 5 steps with B rails, MI    Baseline S, with effor tand fear of falling.    Time 6    Period Weeks    Status New    Target Date 06/24/21                    Plan - 05/13/21 1696     Clinical Impression Statement Patient presents with multiple co morbidities including Covid in 2020, recent wound on bottom of her foot, arthritis, autoimmune diseases, B PN, culminating in overal decreased strength, activity tolerance, and increased back pain. She is limited in all activities per her report. Patient will benefit from comprehensive PT program to address strength, balance, activity tolerance, functional mobility re-education.    Personal Factors and Comorbidities Age;Behavior Pattern;Fitness;Time since onset of injury/illness/exacerbation;Past/Current Experience;Comorbidity 1;Comorbidity 2    Comorbidities Covid, Foot wound     Examination-Activity Limitations Sit;Transfers;Bend;Squat;Stairs;Carry;Stand;Toileting    Examination-Participation Restrictions Cleaning;Community Activity;Interpersonal Relationship;Laundry;Other    Stability/Clinical Decision Making Evolving/Moderate complexity    Clinical Decision Making Moderate    Rehab Potential Good    PT Frequency 2x / week    PT Duration 6 weeks    PT Treatment/Interventions ADLs/Self Care Home Management;Cryotherapy;Electrical Stimulation;Moist Heat;DME Lexicographer;Therapeutic exercise;Balance training;Neuromuscular re-education;Patient/family education;Dry needling    PT Next Visit Plan Progress HEP, balance, endurance, strength trainin gfor BLE and trunk    Consulted and Agree with Plan of Care Patient             [name]:  Abnormal gait, Decreased balance, Decreased endurance, Decreased mobility, Difficulty walking, Impaired sensation, Improper body mechanics, Cardiopulmonary status limiting activity, Decreased activity tolerance, Decreased strength, Pain,  Postural dysfunction  Visit Diagnosis: Muscle weakness (generalized)  Difficulty in walking, not elsewhere classified  Other lack of coordination     Problem List Patient Active Problem List   Diagnosis Date Noted   Chronic hiccups 05/01/2021   Memory loss 05/01/2021   History of COVID-19 05/01/2021   Olfactory impairment 05/01/2021   Senile purpura ([place]) 11/15/2020   Difficulty with speech 07/23/2020   SOB (shortness of breath) 07/23/2020   Right leg swelling 07/23/2020   Dyslipidemia 74/82/7078   Pityriasis lichenoides chronica 09/18/2019   Insomnia 08/07/2019   Anxiety 08/07/2019   Psoriatic arthritis ([place]) 08/07/2019   Gout 08/07/2019   Recurrent UTI 08/07/2019   Charcot's joint of left foot 08/07/2019   Depression 08/07/2019   Pneumonia due to COVID-19 virus  07/09/2019   Physical deconditioning 07/08/2019   COVID-19 07/07/2019   Diabetic peripheral neuropathy ([place]) 04/10/2019   Pain in left knee 10/13/2018   Ischemic colitis ([place]) 10/06/2018   Urinary incontinence 07/21/2018   Diabetic foot ulcer ([place]) 07/13/2018   DM2 (diabetes mellitus, type 2) ([place]) 05/02/2018   Psoriasis 05/02/2018   Osteoarthritis of knee 10/18/2017   Chest pain 01/12/2015   Right bundle branch block 05/20/2014   OSA (obstructive sleep apnea), BiPAP 03/12/2013   Essential hypertension 03/12/2013   Obesity 09/14/2012   Hyponatremia 09/14/2012   S/P right THA, AA 09/13/2012   Deep venous thrombosis of lower extremity ([place]) 08/10/2010   Lymphocytic-plasmacytic colitis 08/11/2007    Marcelina Morel, DPT 05/13/2021, 9:20 AM  [place]. [place], [place], [address] Phone: [phone]   Fax:  [phone]  Name: TERA PELLICANE MRN: [id number] Date of Birth: [birth year]

## 2021-05-13 NOTE — Addendum Note (Signed)
Addended by: Verdene Lennert on: 05/13/2021 03:32 PM   Modules accepted: Orders

## 2021-05-13 NOTE — Therapy (Addendum)
[place]. [place], [place], [address] Phone: [phone]   Fax:  [phone]  Speech Language Pathology Evaluation  Patient Details  Name: [name] [name] MRN: [id number] Date of Birth: 1945/12/05 Referring Provider (SLP): Lazaro Arms NP  Encounter Date: 05/13/2021   End of Session - 05/13/21 1447     Visit Number 1    Number of Visits 9    Date for SLP Re-Evaluation 08/13/21    SLP Start Time 0845    SLP Stop Time  6222    SLP Time Calculation (min) 54 min    Activity Tolerance Patient tolerated treatment well             Past Medical History:  Diagnosis Date   Arthritis    Closed fracture of fifth metatarsal bone 05/13/2018   Deep vein thrombosis ([place])    right calf - 05/2012    Diabetes mellitus without complication ([place])    diet controlled    GERD (gastroesophageal reflux disease)    Hyperlipidemia    Hypertension    Ejection fraction =>55% Left ventricular systolic function is normal. Left ventricular wall motion is normal     Lymphocytic colitis    Neuropathy    diabetic - in bilateral feet    Pityriasis lichenoides chronica    Sleep apnea    bipap    Past Surgical History:  Procedure Laterality Date   DILATION AND CURETTAGE OF UTERUS     EYE SURGERY  07/2020   HAMMER TOE SURGERY     right hand surgery      due to blood infection    torn meniscus repair      right knee    TOTAL HIP ARTHROPLASTY  09/13/2012   Procedure: TOTAL HIP ARTHROPLASTY ANTERIOR APPROACH;  Surgeon: Mauri Pole, MD;  [facility];  Service: Orthopedics;  Laterality: Right;    There were no vitals filed for this visit.   Subjective Assessment - 05/13/21 0851     Subjective Pt was pleasant and cooperative throughout evaluation.    Patient is accompained by: Family member   Doug   Currently in Pain? Yes    Pain Score 7     Pain Location Back    Pain Orientation Lower;Right;Left    Pain Descriptors /  Indicators Aching    Pain Type Chronic pain    Pain Onset More than a month ago    Pain Frequency Intermittent    Pain Relieving Factors No medication this morning                SLP Evaluation [facility] - 05/13/21 9798       SLP Visit Information   SLP Received On 05/13/21    Referring Provider (SLP) Lazaro Arms NP    Onset Date Unknown - reportedly 2021    Medical Diagnosis Memory Loss, Post Covid      Subjective   Patient/Family Stated Goal To work on remembering      General Information   HPI 75 y.o. female with PMH including diabetes mellitus type 2, HTN, lymphocytic colitis, psoriatic and rheumatoid arthritis on immunosuppressants, sleep apnea previous history of DVT   Around Thanksgiving 2020 both she and her husband both became ill with COVID-19 pneumonia and both were hospitalized for approximately a week.      Balance Screen   Has the patient fallen in the past 6 months No    Has the patient had  a decrease in activity level because of a fear of falling?  Yes    Is the patient reluctant to leave their home because of a fear of falling?  Yes      Prior Functional Status   Cognitive/Linguistic Baseline Within functional limits    Type of Home House     Lives With Significant other    Available Support Family;Friend(s)    Education Doctoral Degree    Vocation Retired      Associate Professor   Overall Cognitive Status Impaired/Different from baseline    Area of Impairment Attention;Memory    Advertising copywriter Comprehension   Overall Auditory Comprehension Appears within functional limits for tasks assessed      Verbal Expression   Overall Verbal Expression Appears within functional limits for tasks assessed    Other Verbal Expression Comments Intermittent delayed processing noted; no instances of anomia this session      Oral Motor/Sensory Function   Overall Oral Motor/Sensory Function Appears within functional limits for tasks assessed       Motor Speech   Overall Motor Speech Appears within functional limits for tasks assessed      Standardized Assessments   Standardized Assessments  Other Assessment   CLQT   Other Assessment CLQT subests; SLUMS                             SLP Education - 05/13/21 1447     Education Details Cognitive-communication impairment; lack of evidence to support "brain games"    Person(s) Educated Patient;Other (comment)   SO   Methods Explanation    Comprehension Verbalized understanding;Need further instruction              SLP Short Term Goals - 05/13/21 1435       SLP SHORT TERM GOAL #1   Title Pt will recall 3 attention strategies to decrease cognitive load/fatigue across 4 sessions.    Time 4    Period Weeks    Status New    Target Date 06/10/21      SLP SHORT TERM GOAL #2   Title Pt will recall 3 memory strategies to increase recall of important information across 4 sessions.    Time 4    Period Weeks    Status New    Target Date 06/10/21              SLP Long Term Goals - 05/13/21 1438       SLP LONG TERM GOAL #1   Title Pt will report successful use of attention strategies to decrease cognitive load/fatigue.    Time 8    Period Weeks    Status New    Target Date 07/08/21      SLP LONG TERM GOAL #2   Title Pt will report successful use of memory strategies to increase ability to recall important information.    Time 8    Period Weeks    Status New    Target Date 07/08/21              Plan - 05/13/21 1319     Clinical Impression Statement Pt is a 75 year old female who presents to OP ST evaluation post-Covid (Nov 2020) and an intestinal virus hospitalization (Nov 2021).While in the hospital, pt reported she experienced "word salad" and has had ongoing pain in the right side of head. She is currently scheduled for MRI  to determine and/or r/o possible causes of reported sx.  Pt expressed concerns with word retrieval, and reportedly has  difficulty producing quick responses in conversation. She did not voice any concerns with understanding/auditory comprehension.   Pt endorsed difficulty swallowing large pills (and occasional solids) along with globus sensation, reporting, "It gets stuck." She expressed that she has had ongoing difficulty with swallowing and has had two Modified Barium Swallow (MBS) assessments in the past. SLP was unable to locate last MBS results and plans to contact physician's office to obtain MBS results. SLP was able to locate MBS results from 2018 which indicated pt's swallow to be [facility]. SLP to rec another MBS assessment to r/o aspiration. SLP screened pt's cognitive linguistic skills using the SLUMS and assessed pt using subtests of the CLQT. Pt scored a 20/30 on the SLUMS with deficits noted in attention, short term and working memory, as well as executive functioning skills (i.e. organization/planning). Pt scored the following on the subtests of the CLQT: 8/8 Personal Facts, 10/10 Confrontation Naming, 8/10 Story Retelling, and 6/9 Generative Naming and 9/13 on Clock Drawing. Pt's performance on generative naming indicating some delayed processing and difficulty retrieving words in more abstract categories. Pt's strengths appeared to be object/confrontation naming.  SLP rec ST services to address cognitive-communication impairment; POC to include training attention and memory strategies to maximize functional communication. SLP rec f/u with doc for referral to acute for MBS. SLP to provide further edu on cog-com impairment and neuroplasticity.    Speech Therapy Frequency 1x /week    Duration 8 weeks    Treatment/Interventions Patient/family education;SLP instruction and feedback;Functional tasks;Internal/external aids;Compensatory techniques;Environmental controls;Compensatory strategies    Potential to Achieve Goals Good    Consulted and Agree with Plan of Care Patient;Family member/caregiver              [name]:   Cognitive communication deficit    Problem List Patient Active Problem List   Diagnosis Date Noted   Chronic hiccups 05/01/2021   Memory loss 05/01/2021   History of COVID-19 05/01/2021   Olfactory impairment 05/01/2021   Senile purpura ([place]) 11/15/2020   Difficulty with speech 07/23/2020   SOB (shortness of breath) 07/23/2020   Right leg swelling 07/23/2020   Dyslipidemia 94/49/6759   Pityriasis lichenoides chronica 09/18/2019   Insomnia 08/07/2019   Anxiety 08/07/2019   Psoriatic arthritis ([place]) 08/07/2019   Gout 08/07/2019   Recurrent UTI 08/07/2019   Charcot's joint of left foot 08/07/2019   Depression 08/07/2019   Pneumonia due to COVID-19 virus 07/09/2019   Physical deconditioning 07/08/2019   COVID-19 07/07/2019   Diabetic peripheral neuropathy ([place]) 04/10/2019   Pain in left knee 10/13/2018   Ischemic colitis ([place]) 10/06/2018   Urinary incontinence 07/21/2018   Diabetic foot ulcer ([place]) 07/13/2018   DM2 (diabetes mellitus, type 2) ([place]) 05/02/2018   Psoriasis 05/02/2018   Osteoarthritis of knee 10/18/2017   Chest pain 01/12/2015   Right bundle branch block 05/20/2014   OSA (obstructive sleep apnea), BiPAP 03/12/2013   Essential hypertension 03/12/2013   Obesity 09/14/2012   Hyponatremia 09/14/2012   S/P right THA, AA 09/13/2012   Deep venous thrombosis of lower extremity ([place]) 08/10/2010   Lymphocytic-plasmacytic colitis 08/11/2007    Verdene Lennert MS, [place], CBIS  05/13/2021, 3:31 PM  [place]. [place], [place], [address] Phone: [phone]   Fax:  [phone]  Name: [name] [name] MRN: [id number] Date of Birth: 15-Apr-1946

## 2021-05-16 ENCOUNTER — Ambulatory Visit: Payer: Medicare Other | Admitting: Physical Therapy

## 2021-05-16 ENCOUNTER — Encounter: Payer: Self-pay | Admitting: Physical Therapy

## 2021-05-16 ENCOUNTER — Other Ambulatory Visit: Payer: Self-pay

## 2021-05-16 DIAGNOSIS — R278 Other lack of coordination: Secondary | ICD-10-CM | POA: Diagnosis not present

## 2021-05-16 DIAGNOSIS — M6281 Muscle weakness (generalized): Secondary | ICD-10-CM

## 2021-05-16 DIAGNOSIS — R262 Difficulty in walking, not elsewhere classified: Secondary | ICD-10-CM | POA: Diagnosis not present

## 2021-05-16 DIAGNOSIS — R41841 Cognitive communication deficit: Secondary | ICD-10-CM | POA: Diagnosis not present

## 2021-05-16 NOTE — Patient Instructions (Signed)
Access Code: [email or web address] URL: [email or web address] Date: 05/16/2021 Prepared by: Ethel Rana  Exercises Seated Eccentric Ankle Plantar Flexion with Resistance - Straight Leg - 1 x daily - 7 x weekly - 3 sets - 10 reps D/C standing heel raises due to pain in ankles.  Also added seated postural control holding a 1# object and alternately lifting each foot with tall posture. Instructed to walk as tolerated 3-5 times per day.

## 2021-05-16 NOTE — Therapy (Signed)
[place]. [place], [place], [address] Phone: [phone]   Fax:  [phone]  Physical Therapy Treatment  Patient Details  Name: [name] [name] MRN: [id number] Date of Birth: 25-Jun-1946 No data recorded  Encounter Date: 05/16/2021   PT End of Session - 05/16/21 1033     Visit Number 2    Number of Visits 13    Date for PT Re-Evaluation 06/24/21    PT Start Time 0935    PT Stop Time 1021    PT Time Calculation (min) 46 min    Equipment Utilized During Treatment Gait belt    Activity Tolerance Patient tolerated treatment well;Patient limited by pain    Behavior During Therapy [facility] for tasks assessed/performed             Past Medical History:  Diagnosis Date   Arthritis    Closed fracture of fifth metatarsal bone 05/13/2018   Deep vein thrombosis ([place])    right calf - 05/2012    Diabetes mellitus without complication ([place])    diet controlled    GERD (gastroesophageal reflux disease)    Hyperlipidemia    Hypertension    Ejection fraction =>55% Left ventricular systolic function is normal. Left ventricular wall motion is normal     Lymphocytic colitis    Neuropathy    diabetic - in bilateral feet    Pityriasis lichenoides chronica    Sleep apnea    bipap    Past Surgical History:  Procedure Laterality Date   DILATION AND CURETTAGE OF UTERUS     EYE SURGERY  07/2020   HAMMER TOE SURGERY     right hand surgery      due to blood infection    torn meniscus repair      right knee    TOTAL HIP ARTHROPLASTY  09/13/2012   Procedure: TOTAL HIP ARTHROPLASTY ANTERIOR APPROACH;  Surgeon: Mauri Pole, MD;  Location: [facility];  Service: Orthopedics;  Laterality: Right;    There were no vitals filed for this visit.   Subjective Assessment - 05/16/21 0940     Subjective Patient states she is performing the HEP except for standing heel raises as she is fearful of re-opening the wound on her foot.     Pertinent History Covid 2020, B PN, swallowing issues    Currently in Pain? Yes    Pain Score 4     Pain Location Knee    Pain Orientation Left    Pain Descriptors / Indicators Aching    Pain Type Chronic pain                               [facility] Adult PT Treatment/Exercise - 05/16/21 0959       High Level Balance   High Level Balance Comments Patient performed step backs wiht R LE, rotating to R and reaching back with a weight shift over BOS to touch a target placed behnid her. Repeated to L, 5 times each way. Occasional min a for balance.      Exercises   Exercises Knee/Hip;Lumbar;Ankle      Lumbar Exercises: Standing   Other Standing Lumbar Exercises Held 1# ball in B UE, maintained upright posture while reaching ball out and back and then into rotation B x 5 each.      Lumbar Exercises: Seated   Other Seated Lumbar Exercises Held 1# ball in  B UE, maintained upright posture while reaching ball out and back and then into rotation B x 5 each. Held ball in upright posture in sit, while alternately lifting each foot to touch a target on the floor in front of her. Therapsit facilitated upright posture.      Knee/Hip Exercises: Aerobic   Nustep 5 min at level 4      Knee/Hip Exercises: Standing   Functional Squat Limitations Attempted mini squats from elevated surface. patient C/O ankle pain, so deferred.      Ankle Exercises: Seated   Other Seated Ankle Exercises PF strength/ DF stretch with Light Green T band, as patien tcould not tolerate heel raises.20 reps each.                     PT Education - 05/16/21 1032     Education Details HEP updates.    Person(s) Educated Patient    Methods Explanation;Demonstration;Verbal cues;Tactile cues;Handout    Comprehension Verbalized understanding;Returned demonstration              PT Short Term Goals - 05/16/21 1036       PT SHORT TERM GOAL #1   Title I with basic HEP    Baseline Program  modified to imporve her tolerance.    Time 3    Period Weeks    Status Not Met               PT Long Term Goals - 05/13/21 0910       PT LONG TERM GOAL #1   Title Improve motor strength in BLE to 4+/5, with good endurance to tolerate at least 2 x 20 reps of all strenht exercises.    Baseline 3+-(4-)/5    Time 6    Period Weeks    Status New    Target Date 06/24/21      PT LONG TERM GOAL #2   Title Improve 5x sit to stand time to 12 seconds from normal test height surface, in order to improve ability to rise from restaraunt chairs iwthout use of armrests.    Baseline 21 sec from elevated sureface with compensations.    Time 6    Period Weeks    Status New    Target Date 06/24/21      PT LONG TERM GOAL #3   Title Patient will ambulate functionally x at least 500', on level and unlevel surfaces with LRAD, MI.    Baseline Max 200' with cane, MI, level surfaces, unsteady, slow.    Time 6    Period Weeks    Target Date 06/24/21      PT LONG TERM GOAL #4   Title Patient will tolerate sitting/ standing activities during 45 minute treatment, with maximum of 4 rest breaks, with no C/O back pain or fatigue.    Baseline Reported incresed back pain and fatigue after 30 minutes of minimal activity and sitting.      PT LONG TERM GOAL #5   Title Up and down at least 5 steps with B rails, MI    Baseline S, with effor tand fear of falling.    Time 6    Period Weeks    Status New    Target Date 06/24/21                   Plan - 05/16/21 1034     Clinical Impression Statement Patient reports pain in ankles caused by standing heel raises,  so HEP modified with theraband strengthening instead, aslo added trunk strength/postural control exercises to addrss back pain, and walking for improved endurance. She tolerated treatment activities well, although she frequently has pain due to her chronic arthritis, so treatment activities modified as needed.    Personal Factors and  Comorbidities Age;Behavior Pattern;Fitness;Time since onset of injury/illness/exacerbation;Past/Current Experience;Comorbidity 1;Comorbidity 2    Comorbidities Covid, Foot wound    PT Treatment/Interventions ADLs/Self Care Home Management;Cryotherapy;Electrical Stimulation;Moist Heat;DME Lexicographer;Therapeutic exercise;Balance training;Neuromuscular re-education;Patient/family education;Dry needling    PT Next Visit Plan Continue treatment plan for strenght, balance, endurance training.             [name]:  Abnormal gait, Decreased balance, Decreased endurance, Decreased mobility, Difficulty walking, Impaired sensation, Improper body mechanics, Cardiopulmonary status limiting activity, Decreased activity tolerance, Decreased strength, Pain, Postural dysfunction  Visit Diagnosis: Muscle weakness (generalized)  Difficulty in walking, not elsewhere classified  Other lack of coordination     Problem List Patient Active Problem List   Diagnosis Date Noted   Chronic hiccups 05/01/2021   Memory loss 05/01/2021   History of COVID-19 05/01/2021   Olfactory impairment 05/01/2021   Senile purpura ([place]) 11/15/2020   Difficulty with speech 07/23/2020   SOB (shortness of breath) 07/23/2020   Right leg swelling 07/23/2020   Dyslipidemia 14/78/2956   Pityriasis lichenoides chronica 09/18/2019   Insomnia 08/07/2019   Anxiety 08/07/2019   Psoriatic arthritis ([place]) 08/07/2019   Gout 08/07/2019   Recurrent UTI 08/07/2019   Charcot's joint of left foot 08/07/2019   Depression 08/07/2019   Pneumonia due to COVID-19 virus 07/09/2019   Physical deconditioning 07/08/2019   COVID-19 07/07/2019   Diabetic peripheral neuropathy ([place]) 04/10/2019   Pain in left knee 10/13/2018   Ischemic colitis ([place]) 10/06/2018   Urinary incontinence 07/21/2018   Diabetic foot ulcer  ([place]) 07/13/2018   DM2 (diabetes mellitus, type 2) ([place]) 05/02/2018   Psoriasis 05/02/2018   Osteoarthritis of knee 10/18/2017   Chest pain 01/12/2015   Right bundle branch block 05/20/2014   OSA (obstructive sleep apnea), BiPAP 03/12/2013   Essential hypertension 03/12/2013   Obesity 09/14/2012   Hyponatremia 09/14/2012   S/P right THA, AA 09/13/2012   Deep venous thrombosis of lower extremity ([place]) 08/10/2010   Lymphocytic-plasmacytic colitis 08/11/2007    Marcelina Morel, DPT 05/16/2021, 10:38 AM  [place]. [place], [place], [address] Phone: [phone]   Fax:  [phone]  Name: [name] [name] MRN: [id number] Date of Birth: 1945-10-05

## 2021-05-19 ENCOUNTER — Ambulatory Visit: Payer: Medicare Other | Admitting: Internal Medicine

## 2021-05-19 NOTE — Telephone Encounter (Signed)
Has her machine been ordered?

## 2021-05-20 ENCOUNTER — Ambulatory Visit: Payer: Medicare Other | Admitting: Physical Therapy

## 2021-05-20 ENCOUNTER — Other Ambulatory Visit: Payer: Self-pay

## 2021-05-20 ENCOUNTER — Ambulatory Visit: Payer: Medicare Other | Admitting: Speech Pathology

## 2021-05-20 ENCOUNTER — Encounter: Payer: Self-pay | Admitting: Physical Therapy

## 2021-05-20 DIAGNOSIS — R41841 Cognitive communication deficit: Secondary | ICD-10-CM | POA: Diagnosis not present

## 2021-05-20 DIAGNOSIS — R278 Other lack of coordination: Secondary | ICD-10-CM | POA: Diagnosis not present

## 2021-05-20 DIAGNOSIS — M545 Low back pain, unspecified: Secondary | ICD-10-CM | POA: Insufficient documentation

## 2021-05-20 DIAGNOSIS — R262 Difficulty in walking, not elsewhere classified: Secondary | ICD-10-CM

## 2021-05-20 DIAGNOSIS — M6281 Muscle weakness (generalized): Secondary | ICD-10-CM

## 2021-05-20 DIAGNOSIS — M5451 Vertebrogenic low back pain: Secondary | ICD-10-CM | POA: Diagnosis not present

## 2021-05-20 NOTE — Therapy (Signed)
[place]. [place], [place], [address] Phone: [phone]   Fax:  [phone]  Physical Therapy Treatment  Patient Details  Name: [name] [name] MRN: [id number] Date of Birth: [birth year] No data recorded  Encounter Date: 05/20/2021   PT End of Session - 05/20/21 1528     Visit Number 3    Number of Visits 13    Date for PT Re-Evaluation 06/24/21    PT Start Time 1450    PT Stop Time 2800    PT Time Calculation (min) 40 min    Activity Tolerance Patient tolerated treatment well;Patient limited by pain             Past Medical History:  Diagnosis Date   Arthritis    Closed fracture of fifth metatarsal bone 05/13/2018   Deep vein thrombosis ([place])    right calf - 05/2012    Diabetes mellitus without complication ([place])    diet controlled    GERD (gastroesophageal reflux disease)    Hyperlipidemia    Hypertension    Ejection fraction =>55% Left ventricular systolic function is normal. Left ventricular wall motion is normal     Lymphocytic colitis    Neuropathy    diabetic - in bilateral feet    Pityriasis lichenoides chronica    Sleep apnea    bipap    Past Surgical History:  Procedure Laterality Date   DILATION AND CURETTAGE OF UTERUS     EYE SURGERY  07/2020   HAMMER TOE SURGERY     right hand surgery      due to blood infection    torn meniscus repair      right knee    TOTAL HIP ARTHROPLASTY  09/13/2012   Procedure: TOTAL HIP ARTHROPLASTY ANTERIOR APPROACH;  Surgeon: Mauri Pole, MD;  [facility];  Service: Orthopedics;  Laterality: Right;    There were no vitals filed for this visit.   Subjective Assessment - 05/20/21 1457     Subjective Patient reports that the low back has really been hurting since the last treatment.  Saw MD today for the back pain.  Reports does not want to do any twisting.  Has been referred to water based therapy    Currently in Pain? Yes    Pain Score 3      Pain Location Back    Aggravating Factors  standing, walking, twisiting all increase pain, up to 8/10                               [facility] Adult PT Treatment/Exercise - 05/20/21 0001       Lumbar Exercises: Seated   Long Arc Quad on Chair Both;2 sets;10 reps    Other Seated Lumbar Exercises ball b/n knees squeeze, isometric abs with pball in lap    Other Seated Lumbar Exercises marching 2x10      Knee/Hip Exercises: Seated   Ball Squeeze 20    Clamshell with TheraBand Red   20 reps   Other Seated Knee/Hip Exercises red tband seated row    Hamstring Curl Both;2 sets;10 reps    Hamstring Limitations red tbands                       PT Short Term Goals - 05/16/21 1036       PT SHORT TERM GOAL #1   Title I  with basic HEP    Baseline Program modified to imporve her tolerance.    Time 3    Period Weeks    Status Not Met               PT Long Term Goals - 05/13/21 0910       PT LONG TERM GOAL #1   Title Improve motor strength in BLE to 4+/5, with good endurance to tolerate at least 2 x 20 reps of all strenht exercises.    Baseline 3+-(4-)/5    Time 6    Period Weeks    Status New    Target Date 06/24/21      PT LONG TERM GOAL #2   Title Improve 5x sit to stand time to 12 seconds from normal test height surface, in order to improve ability to rise from restaraunt chairs iwthout use of armrests.    Baseline 21 sec from elevated sureface with compensations.    Time 6    Period Weeks    Status New    Target Date 06/24/21      PT LONG TERM GOAL #3   Title Patient will ambulate functionally x at least 500', on level and unlevel surfaces with LRAD, MI.    Baseline Max 200' with cane, MI, level surfaces, unsteady, slow.    Time 6    Period Weeks    Target Date 06/24/21      PT LONG TERM GOAL #4   Title Patient will tolerate sitting/ standing activities during 45 minute treatment, with maximum of 4 rest breaks, with no C/O back pain  or fatigue.    Baseline Reported incresed back pain and fatigue after 30 minutes of minimal activity and sitting.      PT LONG TERM GOAL #5   Title Up and down at least 5 steps with B rails, MI    Baseline S, with effor tand fear of falling.    Time 6    Period Weeks    Status New    Target Date 06/24/21                   Plan - 05/20/21 1534     Clinical Impression Statement Patient reports a lot of back pain after the last PT visit, reports that she cannot do any twisting or much standing.  I elected to do all seated exercises today and she really seemed to tolerate well, no increase of pain and not much fatigue with all 2x10.    PT Next Visit Plan will need to very slowly look at progression or help with getting her to a water based program    Consulted and Agree with Plan of Care Patient             [name]:  Abnormal gait, Decreased balance, Decreased endurance, Decreased mobility, Difficulty walking, Impaired sensation, Improper body mechanics, Cardiopulmonary status limiting activity, Decreased activity tolerance, Decreased strength, Pain, Postural dysfunction  Visit Diagnosis: Muscle weakness (generalized)  Difficulty in walking, not elsewhere classified  Other lack of coordination     Problem List Patient Active Problem List   Diagnosis Date Noted   Chronic hiccups 05/01/2021   Memory loss 05/01/2021   History of COVID-19 05/01/2021   Olfactory impairment 05/01/2021   Senile purpura ([place]) 11/15/2020   Difficulty with speech 07/23/2020   SOB (shortness of breath) 07/23/2020   Right  leg swelling 07/23/2020   Dyslipidemia 93/79/0240   Pityriasis lichenoides chronica 09/18/2019   Insomnia 08/07/2019   Anxiety 08/07/2019   Psoriatic arthritis ([name]) 08/07/2019   Gout 08/07/2019   Recurrent UTI 08/07/2019   Charcot's joint of left foot  08/07/2019   Depression 08/07/2019   Pneumonia due to COVID-19 virus 07/09/2019   Physical deconditioning 07/08/2019   COVID-19 07/07/2019   Diabetic peripheral neuropathy ([place]) 04/10/2019   Pain in left knee 10/13/2018   Ischemic colitis ([place]) 10/06/2018   Urinary incontinence 07/21/2018   Diabetic foot ulcer ([place]) 07/13/2018   DM2 (diabetes mellitus, type 2) ([place]) 05/02/2018   Psoriasis 05/02/2018   Osteoarthritis of knee 10/18/2017   Chest pain 01/12/2015   Right bundle branch block 05/20/2014   OSA (obstructive sleep apnea), BiPAP 03/12/2013   Essential hypertension 03/12/2013   Obesity 09/14/2012   Hyponatremia 09/14/2012   S/P right THA, AA 09/13/2012   Deep venous thrombosis of lower extremity ([place]) 08/10/2010   Lymphocytic-plasmacytic colitis 08/11/2007    Sumner Boast, PT 05/20/2021, 3:37 PM  [place]. [place], [place], [address] Phone: [phone]   Fax:  [phone]  Name: GEMINI BUNTE MRN: [id number] Date of Birth: 08-16-1945

## 2021-05-20 NOTE — Telephone Encounter (Signed)
Not yet. According to [name] [name] at [facility] is planning to replace her machine at [place] to the patient. She has been given a Materials engineer by [employer] to use until she gets her replacement machine. If she gets a Pharmacist, community, she will have to pay some $$ out of pocket.  Please advise if you still want her to get the [facility].

## 2021-05-20 NOTE — Therapy (Signed)
[name]. [name], [place], [address] Phone: [phone]   Fax:  [phone]  Speech Language Pathology Treatment  Patient Details  Name: [name] [name] MRN: [id number] Date of Birth: 03/15/1946 Referring Provider (SLP): Lazaro Arms NP   Encounter Date: 05/20/2021   End of Session - 05/20/21 1543     Visit Number 2    Number of Visits 9    Date for SLP Re-Evaluation 08/13/21    SLP Start Time 4128    SLP Stop Time  32    SLP Time Calculation (min) 40 min    Activity Tolerance Patient tolerated treatment well             Past Medical History:  Diagnosis Date   Arthritis    Closed fracture of fifth metatarsal bone 05/13/2018   Deep vein thrombosis ([name])    right calf - 05/2012    Diabetes mellitus without complication ([facility])    diet controlled    GERD (gastroesophageal reflux disease)    Hyperlipidemia    Hypertension    Ejection fraction =>55% Left ventricular systolic function is normal. Left ventricular wall motion is normal     Lymphocytic colitis    Neuropathy    diabetic - in bilateral feet    Pityriasis lichenoides chronica    Sleep apnea    bipap    Past Surgical History:  Procedure Laterality Date   DILATION AND CURETTAGE OF UTERUS     EYE SURGERY  07/2020   HAMMER TOE SURGERY     right hand surgery      due to blood infection    torn meniscus repair      right knee    TOTAL HIP ARTHROPLASTY  09/13/2012   Procedure: TOTAL HIP ARTHROPLASTY ANTERIOR APPROACH;  Surgeon: Mauri Pole, MD;  [facility];  Service: Orthopedics;  Laterality: Right;    There were no vitals filed for this visit.   Subjective Assessment - 05/20/21 1540     Subjective Pt reports that she has gotten a referral from her doctor for aquatic therapy/.    Currently in Pain? Yes    Pain Score 4     Pain Location Back    Pain Orientation Right;Left;Medial;Lower    Pain Descriptors / Indicators Aching     Aggravating Factors  twisting    Pain Relieving Factors tylenol                   ADULT SLP TREATMENT - 05/20/21 1818       General Information   Behavior/Cognition Alert;Cooperative      Treatment Provided   Treatment provided Cognitive-Linquistic      Cognitive-Linquistic Treatment   Treatment focused on Cognition    Skilled Treatment SLP provided edu on cognitive-communication impairment and began training on external memory strategies. Pt reports she uses majority of external memory aids, specifically her phone calendar. To cont edu on memory strategies next session.      Progression Toward Goals   Progression toward goals Progressing toward goals                SLP Short Term Goals - 05/13/21 1435       SLP SHORT TERM GOAL #1   Title Pt will recall 3 attention strategies to decrease cognitive load/fatigue across 4 sessions.    Time 4    Period Weeks    Status New    Target Date  06/10/21      SLP SHORT TERM GOAL #2   Title Pt will recall 3 memory strategies to increase recall of important information across 4 sessions.    Time 4    Period Weeks    Status New    Target Date 06/10/21              SLP Long Term Goals - 05/13/21 1438       SLP LONG TERM GOAL #1   Title Pt will report successful use of attention strategies to decrease cognitive load/fatigue.    Time 8    Period Weeks    Status New    Target Date 07/08/21      SLP LONG TERM GOAL #2   Title Pt will report successful use of memory strategies to increase ability to recall important information.    Time 8    Period Weeks    Status New    Target Date 07/08/21              Plan - 05/20/21 1547     Clinical Impression Statement See tx note. SLP was able to locate pt's second barium swallow. SLP clarified with pt that she had a MBS [facility]) and an esophagram. Esophageal imaging indicated an esophageal web; however, specialist's impression was that the 36mm barium pill was  unobstructed by the web. SLP explained to pt how esophageal webs can cause the "stuck" feeling. SLP encouraged pt to reach out to GI if she cont to have difficulty. Pt has had no changes in medical status and does not report any "choking" episodes, just instances where food gets stuck. She now reports this is unchanged from her last imaging. SLP does not believe a MBS is warranted at this time. Cont with current POC.    Speech Therapy Frequency 1x /week    Duration 8 weeks    Treatment/Interventions Patient/family education;SLP instruction and feedback;Functional tasks;Internal/external aids;Compensatory techniques;Environmental controls;Compensatory strategies;Multimodal communcation approach    Potential to Achieve Goals Good    Consulted and Agree with Plan of Care Patient;Family member/caregiver             [name]:   Cognitive communication deficit    Problem List Patient Active Problem List   Diagnosis Date Noted   Chronic hiccups 05/01/2021   Memory loss 05/01/2021   History of COVID-19 05/01/2021   Olfactory impairment 05/01/2021   Senile purpura ([place]) 11/15/2020   Difficulty with speech 07/23/2020   SOB (shortness of breath) 07/23/2020   Right leg swelling 07/23/2020   Dyslipidemia 11/94/1740   Pityriasis lichenoides chronica 09/18/2019   Insomnia 08/07/2019   Anxiety 08/07/2019   Psoriatic arthritis ([place]) 08/07/2019   Gout 08/07/2019   Recurrent UTI 08/07/2019   Charcot's joint of left foot 08/07/2019   Depression 08/07/2019   Pneumonia due to COVID-19 virus 07/09/2019   Physical deconditioning 07/08/2019   COVID-19 07/07/2019   Diabetic peripheral neuropathy ([place]) 04/10/2019   Pain in left knee 10/13/2018   Ischemic colitis ([place]) 10/06/2018   Urinary incontinence 07/21/2018   Diabetic foot ulcer ([place]) 07/13/2018   DM2 (diabetes mellitus, type 2) ([place])  05/02/2018   Psoriasis 05/02/2018   Osteoarthritis of knee 10/18/2017   Chest pain 01/12/2015   Right bundle branch block 05/20/2014   OSA (obstructive sleep apnea), BiPAP 03/12/2013   Essential hypertension 03/12/2013   Obesity 09/14/2012   Hyponatremia 09/14/2012  S/P right THA, AA 09/13/2012   Deep venous thrombosis of lower extremity ([place]) 08/10/2010   Lymphocytic-plasmacytic colitis 08/11/2007    Rosann Auerbach [place] MS, [place], CBIS  05/20/2021, 6:27 PM  [place]. [place], [place], [address] Phone: [phone]   Fax:  [phone]   Name: [name] [name] MRN: [id number] Date of Birth: [birth year]

## 2021-05-22 NOTE — Addendum Note (Signed)
Addended by: Marcelina Morel on: 05/22/2021 07:54 AM   Modules accepted: Orders

## 2021-05-24 ENCOUNTER — Ambulatory Visit
Admission: RE | Admit: 2021-05-24 | Discharge: 2021-05-24 | Disposition: A | Discharge status: Home / Self Care | Payer: Medicare Other | Source: Ambulatory Visit | Attending: Internal Medicine | Admitting: Internal Medicine

## 2021-05-24 ENCOUNTER — Other Ambulatory Visit: Payer: Self-pay

## 2021-05-24 DIAGNOSIS — G8929 Other chronic pain: Secondary | ICD-10-CM

## 2021-05-24 DIAGNOSIS — G319 Degenerative disease of nervous system, unspecified: Secondary | ICD-10-CM | POA: Diagnosis not present

## 2021-05-24 DIAGNOSIS — R519 Headache, unspecified: Secondary | ICD-10-CM

## 2021-05-24 DIAGNOSIS — R29818 Other symptoms and signs involving the nervous system: Secondary | ICD-10-CM | POA: Diagnosis not present

## 2021-05-24 DIAGNOSIS — R471 Dysarthria and anarthria: Secondary | ICD-10-CM

## 2021-05-25 ENCOUNTER — Other Ambulatory Visit: Payer: Self-pay | Admitting: Internal Medicine

## 2021-05-25 NOTE — Progress Notes (Signed)
Subjective:    Patient ID: [name] [name], female    DOB: 1946/04/02, 75 y.o.   MRN: [id number]  This visit occurred during the SARS-CoV-2 public health emergency.  Safety protocols were in place, including screening questions prior to the visit, additional usage of staff PPE, and extensive cleaning of exam room while observing appropriate contact time as indicated for disinfecting solutions.     HPI The patient is here for follow up of their chronic medical problems, including DM with neuropathy, htn, hld, depression, anxiety, gout  Long covid -- Loss of taste and smell from covid, hiccups, deconditioning.  Had hiccups - took the medication from long covid clinic - helped but did not go away.   Made her have blurry vision and more off balance.  She continues to have hiccups-she may have them at least once a day and they will last for short time.  But then go away.  Doing PT.  After first session she had terrible back pain. She did see her orthopedic - he advised avoiding land PT - he referred her for aquatic therapy.    Neurology speech therapy-she has started to feel.  Has been eating low carbs for several weeks.  This am her 254.  Sugar was 155 Saturday.   Most sugars between 150- 250.  She does not understand why her sugars have not been better controlled since she is eating much better.  She has lost weight.  She is still limited in her activity.  Increase depression due to increased back pain.  She takes tylenol.  Also increased anxiety.  She is getting very anxious when she goes to bed.     Frequent headaches right side of head - couple of times a week.  Has h/o of migraines.  No migraines in years.  She had an MRI over the weekend, which did not reveal any concerning findings.    Medications and allergies reviewed with patient and updated if appropriate.  Patient Active Problem List   Diagnosis Date Noted   Chronic hiccups 05/01/2021   Memory loss 05/01/2021   History  of COVID-19 05/01/2021   Olfactory impairment 05/01/2021   Senile purpura ([place]) 11/15/2020   Difficulty with speech 07/23/2020   SOB (shortness of breath) 07/23/2020   Right leg swelling 07/23/2020   Dyslipidemia 54/65/6812   Pityriasis lichenoides chronica 09/18/2019   Insomnia 08/07/2019   Anxiety 08/07/2019   Psoriatic arthritis ([place]) 08/07/2019   Gout 08/07/2019   Recurrent UTI 08/07/2019   Charcot's joint of left foot 08/07/2019   Depression 08/07/2019   Pneumonia due to COVID-19 virus 07/09/2019   Physical deconditioning 07/08/2019   COVID-19 07/07/2019   Diabetic peripheral neuropathy ([place]) 04/10/2019   Pain in left knee 10/13/2018   Ischemic colitis ([place]) 10/06/2018   Urinary incontinence 07/21/2018   Diabetic foot ulcer ([place]) 07/13/2018   DM2 (diabetes mellitus, type 2) ([place]) 05/02/2018   Psoriasis 05/02/2018   Osteoarthritis of knee 10/18/2017   Chest pain 01/12/2015   Right bundle branch block 05/20/2014   OSA (obstructive sleep apnea), BiPAP 03/12/2013   Essential hypertension 03/12/2013   Obesity 09/14/2012   Hyponatremia 09/14/2012   S/P right THA, AA 09/13/2012   Deep venous thrombosis of lower extremity ([place]) 08/10/2010   Lymphocytic-plasmacytic colitis 08/11/2007    Current Outpatient Medications on File Prior to Visit  Medication Sig Dispense Refill   Accu-Chek Softclix Lancets lancets Use as instructed to check sugars 2-3 times daily.  E11.42  100 each 12   acetaminophen (TYLENOL) 325 MG tablet Take 2 tablets (650 mg total) by mouth every 6 (six) hours as needed for mild pain or headache.     Alcohol Swabs PADS UAD to check sugars.  E11.65 100 each 12   allopurinol (ZYLOPRIM) 100 MG tablet TAKE 1 TABLET(100 MG) BY MOUTH TWICE DAILY 180 tablet 3   ALPRAZolam (XANAX) 0.25 MG tablet TAKE 1 TABLET(0.25 MG) BY MOUTH TWICE DAILY AS NEEDED FOR ANXIETY 20 tablet 0   ascorbic acid (VITAMIN C) 500 MG tablet Take 500 mg by mouth at bedtime.     aspirin 81 MG tablet  Take 81 mg by mouth See admin instructions. Every 3 days     azelastine (ASTELIN) 0.1 % nasal spray INSTILL 2 SPRAYS IN EACH NOSTRIL TWICE DAILY 30 mL 12   Blood Glucose Monitoring Suppl (ACCU-CHEK GUIDE) w/Device KIT AS DIRECTED 1 kit 0   Budesonide 9 MG TB24 Take 9 mg by mouth daily.      carvedilol (COREG) 6.25 MG tablet TAKE 1 TABLET(6.25 MG) BY MOUTH TWICE DAILY WITH A MEAL 60 tablet 3   Certolizumab Pegol (CIMZIA [place]) Inject 400 mg into the skin every 30 (thirty) days. 200 mg on each side of stomach     Cholecalciferol (VITAMIN D3) 1000 UNITS CAPS Take 1,000 Units by mouth 2 (two) times daily.      Cyanocobalamin (VITAMIN B-12) 1000 MCG SUBL Place 1,000 mcg under the tongue daily.     cyclobenzaprine (FLEXERIL) 10 MG tablet TAKE 1 TABLET BY MOUTH AT BEDTIME 90 tablet 1   DULCOLAX 100 MG capsule TAKE 1 CAPSULE(100 MG) BY MOUTH TWICE DAILY 60 capsule 2   folic acid (FOLVITE) 1 MG tablet TAKE 1 TABLET(1 MG) BY MOUTH DAILY 90 tablet 3   furosemide (LASIX) 20 MG tablet TAKE 1 TABLET(20 MG) BY MOUTH DAILY 5 tablet 0   glucose blood (ACCU-CHEK GUIDE) test strip Use as instructed to check sugars 2-3 times daily.  E11.42 100 each 12   Insulin Pen Needle (B-D UF III MINI PEN NEEDLES) 31G X 5 MM MISC Use daily for insulin pen 90 each 3   lisinopril-hydrochlorothiazide (ZESTORETIC) 20-25 MG tablet TAKE 1 TABLET BY MOUTH DAILY 90 tablet 1   loperamide (IMODIUM) 2 MG capsule Take 6 mg by mouth 2 (two) times daily.     Melatonin 5 MG SUBL Place 5 mg under the tongue at bedtime.     metFORMIN (GLUCOPHAGE-XR) 500 MG 24 hr tablet TAKE 1 TABLET(500 MG) BY MOUTH TWICE DAILY 180 tablet 2   methotrexate (RHEUMATREX) 2.5 MG tablet Take 20 mg by mouth every Friday.      naftifine (NAFTIN) 1 % cream Apply 1 application topically daily as needed (rash).      omeprazole (PRILOSEC) 40 MG capsule TAKE 1 CAPSULE BY MOUTH EVERY DAY 90 capsule 1   potassium chloride (KLOR-CON) 10 MEQ tablet Take 3 tablets (30 mEq total)  by mouth daily. 270 tablet 3   prednisoLONE acetate (PRED FORTE) 1 % ophthalmic suspension Place 4 drops into the right eye See admin instructions. 4 drops  daily for 1 week, 2 drops daily until post op. Surgery 07/11/20     Probiotic Product (ALIGN PO) Take 1 capsule by mouth daily.     rosuvastatin (CRESTOR) 5 MG tablet TAKE 1 TABLET(5 MG) BY MOUTH DAILY 90 tablet 3   triamcinolone cream (KENALOG) 0.1 % Apply 1 application topically 2 (two) times daily. 30 g 0  No current facility-administered medications on file prior to visit.    Past Medical History:  Diagnosis Date   Arthritis    Closed fracture of fifth metatarsal bone 05/13/2018   Deep vein thrombosis (HCC)    right calf - 05/2012    Diabetes mellitus without complication (HCC)    diet controlled    GERD (gastroesophageal reflux disease)    Hyperlipidemia    Hypertension    Ejection fraction =>55% Left ventricular systolic function is normal. Left ventricular wall motion is normal     Lymphocytic colitis    Neuropathy    diabetic - in bilateral feet    Pityriasis lichenoides chronica    Sleep apnea    bipap    Past Surgical History:  Procedure Laterality Date   DILATION AND CURETTAGE OF UTERUS     EYE SURGERY  07/2020   HAMMER TOE SURGERY     right hand surgery      due to blood infection    torn meniscus repair      right knee    TOTAL HIP ARTHROPLASTY  09/13/2012   Procedure: TOTAL HIP ARTHROPLASTY ANTERIOR APPROACH;  Surgeon: Mauri Pole, MD;  Location: [facility];  Service: Orthopedics;  Laterality: Right;    Social History   Socioeconomic History   Marital status: Divorced    Spouse name: Not on file   Number of children: Not on file   Years of education: Not on file   Highest education level: Not on file  Occupational History   Not on file  Tobacco Use   Smoking status: Never   Smokeless tobacco: Never  Substance and Sexual Activity   Alcohol use: Yes    Comment: occasional wine    Drug use: No    Sexual activity: Not on file  Other Topics Concern   Not on file  Social History Narrative   She is widowed. She has 2 children, 2 grandchildren. She does drink occasional alcohol . There is no tobacco . She does not have a routine exercise program   Social Determinants of Health   Financial Resource Strain: Not on file  Food Insecurity: Not on file  Transportation Needs: Not on file  Physical Activity: Not on file  Stress: Not on file  Social Connections: Not on file    Family History  Problem Relation Age of Onset   Hypertension Mother     Review of Systems  Constitutional:  Negative for chills and fever.  Respiratory:  Positive for cough (some) and shortness of breath (mild - deconditioning). Negative for wheezing.   Cardiovascular:  Positive for leg swelling (ankles - mild). Negative for chest pain and palpitations.  Gastrointestinal:        Jerrye Bushy controlled  Neurological:  Positive for light-headedness (sometimes) and headaches (2/week).      Objective:   Vitals:   05/26/21 1114  BP: 126/74  Pulse: 65  Temp: 98.5 F (36.9 C)  SpO2: 97%   BP Readings from Last 3 Encounters:  05/26/21 126/74  04/24/21 (!) 121/59  04/07/21 (!) 159/84   Wt Readings from Last 3 Encounters:  05/26/21 227 lb (103 kg)  04/24/21 227 lb 4.8 oz (103.1 kg)  04/07/21 249 lb (112.9 kg)   Body mass index is 36.66 kg/m.   Physical Exam    Constitutional: Appears well-developed and well-nourished. No distress.  HENT:  Head: Normocephalic and atraumatic.  Neck: Neck supple. No tracheal deviation present. No thyromegaly present.  No cervical  lymphadenopathy Cardiovascular: Normal rate, regular rhythm and normal heart sounds.   No murmur heard. No carotid bruit .  No edema Pulmonary/Chest: Effort normal and breath sounds normal. No respiratory distress. No has no wheezes. No rales.  Skin: Skin is warm and dry. Not diaphoretic.  Psychiatric: Normal mood and affect. Behavior is normal.       Assessment & Plan:    Flu immunization administered today.    See Problem List for Assessment and Plan of chronic medical problems.

## 2021-05-26 ENCOUNTER — Ambulatory Visit (INDEPENDENT_AMBULATORY_CARE_PROVIDER_SITE_OTHER): Payer: Medicare Other | Admitting: Internal Medicine

## 2021-05-26 ENCOUNTER — Other Ambulatory Visit: Payer: Self-pay

## 2021-05-26 ENCOUNTER — Telehealth: Payer: Self-pay | Admitting: Internal Medicine

## 2021-05-26 ENCOUNTER — Encounter: Payer: Self-pay | Admitting: Internal Medicine

## 2021-05-26 VITALS — BP 126/74 | HR 65 | Temp 98.5°F | Ht 65.98 in | Wt 227.0 lb

## 2021-05-26 DIAGNOSIS — H524 Presbyopia: Secondary | ICD-10-CM | POA: Diagnosis not present

## 2021-05-26 DIAGNOSIS — F3289 Other specified depressive episodes: Secondary | ICD-10-CM

## 2021-05-26 DIAGNOSIS — F419 Anxiety disorder, unspecified: Secondary | ICD-10-CM | POA: Diagnosis not present

## 2021-05-26 DIAGNOSIS — E785 Hyperlipidemia, unspecified: Secondary | ICD-10-CM

## 2021-05-26 DIAGNOSIS — E119 Type 2 diabetes mellitus without complications: Secondary | ICD-10-CM | POA: Diagnosis not present

## 2021-05-26 DIAGNOSIS — I10 Essential (primary) hypertension: Secondary | ICD-10-CM

## 2021-05-26 DIAGNOSIS — E1142 Type 2 diabetes mellitus with diabetic polyneuropathy: Secondary | ICD-10-CM | POA: Diagnosis not present

## 2021-05-26 DIAGNOSIS — R519 Headache, unspecified: Secondary | ICD-10-CM | POA: Diagnosis not present

## 2021-05-26 DIAGNOSIS — Z961 Presence of intraocular lens: Secondary | ICD-10-CM | POA: Diagnosis not present

## 2021-05-26 DIAGNOSIS — K52839 Microscopic colitis, unspecified: Secondary | ICD-10-CM

## 2021-05-26 LAB — HM DIABETES EYE EXAM

## 2021-05-26 MED ORDER — SERTRALINE HCL 100 MG PO TABS: 100.0000 mg | ORAL_TABLET | Freq: Every day | ORAL | 1 refills | Status: DC

## 2021-05-26 MED ORDER — LEVEMIR FLEXTOUCH 100 UNIT/ML ~~LOC~~ SOPN: 25.0000 [IU] | PEN_INJECTOR | Freq: Two times a day (BID) | SUBCUTANEOUS | 5 refills | Status: DC

## 2021-05-26 NOTE — Assessment & Plan Note (Signed)
Chronic Not ideally controlled Doing okay with sertraline 50 mg daily-we will increase to 100 mg daily Continue alprazolam 0.25 mg daily as needed-she is keeping this to a minimum and will continue to try to keep this to a minimum Could consider BuSpar if anxiety is not better controlled with the increase in sertraline

## 2021-05-26 NOTE — Patient Instructions (Addendum)
  Flu immunization administered today.     Blood work was ordered.     Medications changes include :   increase sertraline to 100 mg daily.  Increase levemir to 25 units twice daily    Your prescription(s) have been submitted to your pharmacy. Please take as directed and contact our office if you believe you are having problem(s) with the medication(s).    Please followup in 3 months

## 2021-05-26 NOTE — Assessment & Plan Note (Signed)
Chronic Blood pressure well controlled CMP Continue carvedilol 6.125 mg twice daily, lisinopril-hydrochlorothiazide 20-25 mg daily

## 2021-05-26 NOTE — Assessment & Plan Note (Signed)
Chronic Depression is not ideally controlled Increase sertraline to 100 mg daily

## 2021-05-26 NOTE — Assessment & Plan Note (Signed)
Chronic Lab Results  Component Value Date   HGBA1C 8.5 (H) 11/15/2020   Sugars not ideally controlled-sugars at home 150-250 Increase Levemir to 25 units twice daily Continue metformin 500 mg XR twice daily Continue weight loss efforts, low carbohydrate diet Follow-up in 3 months

## 2021-05-26 NOTE — Assessment & Plan Note (Signed)
Chronic Check lipid panel  Continue crestor 5 mg daily Regular exercise and healthy diet encouraged  

## 2021-05-27 ENCOUNTER — Encounter: Payer: Self-pay | Admitting: Physical Therapy

## 2021-05-27 ENCOUNTER — Encounter: Payer: Self-pay | Admitting: Internal Medicine

## 2021-05-27 ENCOUNTER — Ambulatory Visit: Payer: Medicare Other | Admitting: Physical Therapy

## 2021-05-27 ENCOUNTER — Other Ambulatory Visit (HOSPITAL_COMMUNITY)
Admission: RE | Admit: 2021-05-27 | Discharge: 2021-05-27 | Disposition: A | Discharge status: Home / Self Care | Payer: Medicare Other | Source: Ambulatory Visit | Attending: Ophthalmology | Admitting: Ophthalmology

## 2021-05-27 ENCOUNTER — Telehealth: Payer: Self-pay | Admitting: Internal Medicine

## 2021-05-27 ENCOUNTER — Ambulatory Visit: Payer: Medicare Other | Admitting: Speech Pathology

## 2021-05-27 DIAGNOSIS — M6281 Muscle weakness (generalized): Secondary | ICD-10-CM | POA: Diagnosis not present

## 2021-05-27 DIAGNOSIS — R262 Difficulty in walking, not elsewhere classified: Secondary | ICD-10-CM

## 2021-05-27 DIAGNOSIS — R51 Headache with orthostatic component, not elsewhere classified: Secondary | ICD-10-CM | POA: Diagnosis not present

## 2021-05-27 DIAGNOSIS — R519 Headache, unspecified: Secondary | ICD-10-CM

## 2021-05-27 LAB — CBC WITH DIFFERENTIAL/PLATELET
Abs Immature Granulocytes: 0.07 10*3/uL (ref 0.00–0.07)
Basophils Absolute: 0 10*3/uL (ref 0.0–0.1)
Basophils Relative: 0 %
Eosinophils Absolute: 0.2 10*3/uL (ref 0.0–0.5)
Eosinophils Relative: 1 %
HCT: 37.7 % (ref 36.0–46.0)
Hemoglobin: 11.8 g/dL — ABNORMAL LOW (ref 12.0–15.0)
Immature Granulocytes: 1 %
Lymphocytes Relative: 26 %
Lymphs Abs: 3.1 10*3/uL (ref 0.7–4.0)
MCH: 28.3 pg (ref 26.0–34.0)
MCHC: 31.3 g/dL (ref 30.0–36.0)
MCV: 90.4 fL (ref 80.0–100.0)
Monocytes Absolute: 0.8 10*3/uL (ref 0.1–1.0)
Monocytes Relative: 6 %
Neutro Abs: 7.8 10*3/uL — ABNORMAL HIGH (ref 1.7–7.7)
Neutrophils Relative %: 66 %
Platelets: 253 10*3/uL (ref 150–400)
RBC: 4.17 MIL/uL (ref 3.87–5.11)
RDW: 19.9 % — ABNORMAL HIGH (ref 11.5–15.5)
WBC: 11.9 10*3/uL — ABNORMAL HIGH (ref 4.0–10.5)
nRBC: 0 % (ref 0.0–0.2)

## 2021-05-27 LAB — C-REACTIVE PROTEIN: CRP: 1.2 mg/dL — ABNORMAL HIGH (ref <1.0)

## 2021-05-27 LAB — SEDIMENTATION RATE: Sed Rate: 35 mm/hr — ABNORMAL HIGH (ref 0–22)

## 2021-05-27 NOTE — Therapy (Signed)
[place]. [place], [place], [address] Phone: [phone]   Fax:  [phone]  Physical Therapy Treatment  Patient Details  Name: [name] [name] MRN: [id number] Date of Birth: 1945/10/02 No data recorded  Encounter Date: 05/27/2021   PT End of Session - 05/27/21 1225     Visit Number 4    Number of Visits 13    Date for PT Re-Evaluation 06/24/21    PT Start Time 1220    PT Stop Time 6962    PT Time Calculation (min) 38 min    Activity Tolerance Patient tolerated treatment well    Behavior During Therapy [facility] for tasks assessed/performed             Past Medical History:  Diagnosis Date   Arthritis    Closed fracture of fifth metatarsal bone 05/13/2018   Deep vein thrombosis ([place])    right calf - 05/2012    Diabetes mellitus without complication ([place])    diet controlled    GERD (gastroesophageal reflux disease)    Hyperlipidemia    Hypertension    Ejection fraction =>55% Left ventricular systolic function is normal. Left ventricular wall motion is normal     Lymphocytic colitis    Neuropathy    diabetic - in bilateral feet    Pityriasis lichenoides chronica    Sleep apnea    bipap    Past Surgical History:  Procedure Laterality Date   DILATION AND CURETTAGE OF UTERUS     EYE SURGERY  07/2020   HAMMER TOE SURGERY     right hand surgery      due to blood infection    torn meniscus repair      right knee    TOTAL HIP ARTHROPLASTY  09/13/2012   Procedure: TOTAL HIP ARTHROPLASTY ANTERIOR APPROACH;  Surgeon: Mauri Pole, MD;  Location: [facility];  Service: Orthopedics;  Laterality: Right;    There were no vitals filed for this visit.   Subjective Assessment - 05/27/21 1225     Subjective My back is still hurting. It's okay sitting and feels better lying down. Hurts as soon as she stands up.    Pertinent History Covid 2020, B PN, swallowing issues    How long can you walk comfortably?  approximately 200'    Patient Stated Goals Walk without AD for longer distances.    Currently in Pain? Yes    Pain Score 4     Pain Location Back    Pain Orientation Right;Left;Lower    Pain Descriptors / Indicators Aching    Pain Type Chronic pain                               [facility] Adult PT Treatment/Exercise - 05/27/21 0001       Lumbar Exercises: Standing   Other Standing Lumbar Exercises lumbar extension with elbows on wall dropping hips in x 10      Lumbar Exercises: Seated   Long Arc Quad on Chair Both;2 sets;10 reps    Other Seated Lumbar Exercises ball b/n knees squeeze 5 sec hold x 10, isometric abs with pball in lap 5 sec hold x 10; OH lift with plyo ball x 10    Other Seated Lumbar Exercises marching 2x10   cues to engage abs     Knee/Hip Exercises: Aerobic   Nustep L5 x 8 min  Knee/Hip Exercises: Seated   Clamshell with TheraBand Red   20 reps, increase to green next visit   Other Seated Knee/Hip Exercises red and green tband seated row 10 ea    Hamstring Curl Both;2 sets;10 reps    Hamstring Limitations Green band    Sit to [employer] 10 reps;with UE support   foam in chair                    PT Education - 05/27/21 1258     Education Details HEP    Person(s) Educated Patient    Methods Explanation;Demonstration;Handout    Comprehension Verbalized understanding;Returned demonstration              PT Short Term Goals - 05/16/21 1036       PT SHORT TERM GOAL #1   Title I with basic HEP    Baseline Program modified to imporve her tolerance.    Time 3    Period Weeks    Status Not Met               PT Long Term Goals - 05/13/21 0910       PT LONG TERM GOAL #1   Title Improve motor strength in BLE to 4+/5, with good endurance to tolerate at least 2 x 20 reps of all strenht exercises.    Baseline 3+-(4-)/5    Time 6    Period Weeks    Status New    Target Date 06/24/21      PT LONG TERM GOAL #2   Title  Improve 5x sit to stand time to 12 seconds from normal test height surface, in order to improve ability to rise from restaraunt chairs iwthout use of armrests.    Baseline 21 sec from elevated sureface with compensations.    Time 6    Period Weeks    Status New    Target Date 06/24/21      PT LONG TERM GOAL #3   Title Patient will ambulate functionally x at least 500', on level and unlevel surfaces with LRAD, MI.    Baseline Max 200' with cane, MI, level surfaces, unsteady, slow.    Time 6    Period Weeks    Target Date 06/24/21      PT LONG TERM GOAL #4   Title Patient will tolerate sitting/ standing activities during 45 minute treatment, with maximum of 4 rest breaks, with no C/O back pain or fatigue.    Baseline Reported incresed back pain and fatigue after 30 minutes of minimal activity and sitting.      PT LONG TERM GOAL #5   Title Up and down at least 5 steps with B rails, MI    Baseline S, with effor tand fear of falling.    Time 6    Period Weeks    Status New    Target Date 06/24/21                   Plan - 05/27/21 1259     Clinical Impression Statement Patient reporting continued LBP, but tolerated TE well today without complaint. Able to tolerate increased resistance with Nustep and bands today. Encouraged to do sit to stands at home with UE support to start.    PT Frequency 2x / week    PT Duration 6 weeks    PT Treatment/Interventions ADLs/Self Care Home Management;Cryotherapy;Electrical Stimulation;Moist Heat;DME Lexicographer;Therapeutic exercise;Balance training;Neuromuscular re-education;Patient/family education;Dry needling    PT  Next Visit Plan will need to very slowly look at progression or help with getting her to a water based program    PT Home Exercise Plan [email or web address]    Consulted and Agree with Plan of Care Patient             Patient will benefit from skilled therapeutic intervention in order to improve the following deficits  and impairments:  Abnormal gait, Decreased balance, Decreased endurance, Decreased mobility, Difficulty walking, Impaired sensation, Improper body mechanics, Cardiopulmonary status limiting activity, Decreased activity tolerance, Decreased strength, Pain, Postural dysfunction  Visit Diagnosis: Muscle weakness (generalized)  Difficulty in walking, not elsewhere classified     Problem List Patient Active Problem List   Diagnosis Date Noted   Chronic hiccups 05/01/2021   Memory loss 05/01/2021   History of COVID-19 05/01/2021   Olfactory impairment 05/01/2021   Senile purpura ([place]) 11/15/2020   Difficulty with speech 07/23/2020   SOB (shortness of breath) 07/23/2020   Dyslipidemia 88/32/5498   Pityriasis lichenoides chronica 09/18/2019   Insomnia 08/07/2019   Anxiety 08/07/2019   Psoriatic arthritis ([place]) 08/07/2019   Gout 08/07/2019   Recurrent UTI 08/07/2019   Charcot's joint of left foot 08/07/2019   Depression 08/07/2019   Pneumonia due to COVID-19 virus 07/09/2019   Physical deconditioning 07/08/2019   COVID-19 07/07/2019   Diabetic peripheral neuropathy ([place]) 04/10/2019   Pain in left knee 10/13/2018   Ischemic colitis ([place]) 10/06/2018   Urinary incontinence 07/21/2018   Diabetic foot ulcer ([place]) 07/13/2018   DM2 (diabetes mellitus, type 2) ([place]) 05/02/2018   Psoriasis 05/02/2018   Osteoarthritis of knee 10/18/2017   Right bundle branch block 05/20/2014   OSA (obstructive sleep apnea), BiPAP 03/12/2013   Essential hypertension 03/12/2013   Obesity 09/14/2012   Hyponatremia 09/14/2012   S/P right THA, AA 09/13/2012   Deep venous thrombosis of lower extremity ([place]) 08/10/2010   Lymphocytic-plasmacytic colitis 08/11/2007    Madelyn Flavors PT 05/27/2021, 5:39 PM  [place]. [place], [place], [address] Phone: [phone]   Fax:  [phone]  Name: [name] [name] MRN: [id number] Date of Birth:  03-Jun-1946

## 2021-05-27 NOTE — Telephone Encounter (Signed)
Spoke with patient today who was upset that we did not call her yesterday to have her labs done. Apologized to patient but did make her aware that we strive to answer all messages as soon as we can and that we received message late yesterday.  She was being seen today and they were calling her back for labs while on the phone with me.  Patient hung up.

## 2021-05-27 NOTE — Progress Notes (Signed)
Outside notes received. Information abstracted. Notes sent to scan.  

## 2021-05-27 NOTE — Telephone Encounter (Signed)
Patient called to follow-up on labwork that she needs done here at [facility]  Patient was seen at [facility] yesterday and Dr. Valetta Close stated she needed the following labs drawn asap:  ESR CRP CBC w/diff  Patient has PT at 12:15pm for an hour  Patie

## 2021-05-27 NOTE — Patient Instructions (Signed)
Access Code: [email or web address] URL: [email or web address] Date: 05/27/2021 Prepared by: Almyra Free  Exercises Standing Lumbar Extension at [place] - 2 x daily - 7 x weekly - 1 sets - 10 reps Sit to Stand with Armchair - 2 x daily - 7 x weekly - 1 sets - 5-10 reps

## 2021-05-27 NOTE — Telephone Encounter (Signed)
Please call patient-[facility] sent me a note they are requesting blood work done to rule out inflammation in the blood vessel causing her headache.  Blood work has been ordered and she should come in soon as possible to have this done

## 2021-05-28 DIAGNOSIS — Z6836 Body mass index (BMI) 36.0-36.9, adult: Secondary | ICD-10-CM | POA: Diagnosis not present

## 2021-05-28 DIAGNOSIS — L408 Other psoriasis: Secondary | ICD-10-CM | POA: Diagnosis not present

## 2021-05-28 DIAGNOSIS — K5289 Other specified noninfective gastroenteritis and colitis: Secondary | ICD-10-CM | POA: Diagnosis not present

## 2021-05-28 DIAGNOSIS — Z79899 Other long term (current) drug therapy: Secondary | ICD-10-CM | POA: Diagnosis not present

## 2021-05-28 DIAGNOSIS — L4059 Other psoriatic arthropathy: Secondary | ICD-10-CM | POA: Diagnosis not present

## 2021-05-28 DIAGNOSIS — E669 Obesity, unspecified: Secondary | ICD-10-CM | POA: Diagnosis not present

## 2021-05-28 DIAGNOSIS — M1009 Idiopathic gout, multiple sites: Secondary | ICD-10-CM | POA: Diagnosis not present

## 2021-05-29 ENCOUNTER — Ambulatory Visit: Payer: Medicare Other | Admitting: Physical Therapy

## 2021-05-30 DIAGNOSIS — L4059 Other psoriatic arthropathy: Secondary | ICD-10-CM | POA: Diagnosis not present

## 2021-06-02 DIAGNOSIS — D692 Other nonthrombocytopenic purpura: Secondary | ICD-10-CM | POA: Diagnosis not present

## 2021-06-02 DIAGNOSIS — L400 Psoriasis vulgaris: Secondary | ICD-10-CM | POA: Diagnosis not present

## 2021-06-03 ENCOUNTER — Encounter: Payer: Self-pay | Admitting: Speech Pathology

## 2021-06-03 ENCOUNTER — Other Ambulatory Visit: Payer: Self-pay

## 2021-06-03 ENCOUNTER — Encounter: Payer: Self-pay | Admitting: Physical Therapy

## 2021-06-03 ENCOUNTER — Ambulatory Visit: Payer: Medicare Other | Admitting: Speech Pathology

## 2021-06-03 ENCOUNTER — Ambulatory Visit: Payer: Medicare Other | Admitting: Physical Therapy

## 2021-06-03 DIAGNOSIS — R262 Difficulty in walking, not elsewhere classified: Secondary | ICD-10-CM

## 2021-06-03 DIAGNOSIS — M6281 Muscle weakness (generalized): Secondary | ICD-10-CM | POA: Diagnosis not present

## 2021-06-03 DIAGNOSIS — R278 Other lack of coordination: Secondary | ICD-10-CM

## 2021-06-03 DIAGNOSIS — R41841 Cognitive communication deficit: Secondary | ICD-10-CM | POA: Diagnosis not present

## 2021-06-03 NOTE — Therapy (Signed)
[place]. [place], [place], [address] Phone: [phone]   Fax:  [phone]  Speech Language Pathology Treatment  Patient Details  Name: [name] [name] MRN: [id number] Date of Birth: 01-12-1946 Referring Provider (SLP): Lazaro Arms NP   Encounter Date: 06/03/2021   End of Session - 06/03/21 1641     Visit Number 3    Number of Visits 9    Date for SLP Re-Evaluation 08/13/21    SLP Start Time 8786    SLP Stop Time  7672    SLP Time Calculation (min) 45 min    Activity Tolerance Patient tolerated treatment well             Past Medical History:  Diagnosis Date   Arthritis    Closed fracture of fifth metatarsal bone 05/13/2018   Deep vein thrombosis ([place])    right calf - 05/2012    Diabetes mellitus without complication (HCC)    diet controlled    GERD (gastroesophageal reflux disease)    Hyperlipidemia    Hypertension    Ejection fraction =>55% Left ventricular systolic function is normal. Left ventricular wall motion is normal     Lymphocytic colitis    Neuropathy    diabetic - in bilateral feet    Pityriasis lichenoides chronica    Sleep apnea    bipap    Past Surgical History:  Procedure Laterality Date   DILATION AND CURETTAGE OF UTERUS     EYE SURGERY  07/2020   HAMMER TOE SURGERY     right hand surgery      due to blood infection    torn meniscus repair      right knee    TOTAL HIP ARTHROPLASTY  09/13/2012   Procedure: TOTAL HIP ARTHROPLASTY ANTERIOR APPROACH;  Surgeon: Mauri Pole, MD;  Location: [facility];  Service: Orthopedics;  Laterality: Right;    There were no vitals filed for this visit.   Subjective Assessment - 06/03/21 1638     Subjective Pt reports she has been using several external memory strategies with success.    Currently in Pain? No/denies                   ADULT SLP TREATMENT - 06/03/21 1639       General Information   Behavior/Cognition  Alert;Cooperative      Treatment Provided   Treatment provided Cognitive-Linquistic      Cognitive-Linquistic Treatment   Treatment focused on Cognition    Skilled Treatment SLP completed training on memory strategies this session with focus on internal strategies. Pt reports, "rehearsal" seems to be a strategy she would most likely use. SLP discussed how"chunking" may be beneficial to assist with recalling numbers after pt verbalized this is something she struggles with. To begin attention strategies next session.      Progression Toward Goals   Progression toward goals Progressing toward goals                SLP Short Term Goals - 05/13/21 1435       SLP SHORT TERM GOAL #1   Title Pt will recall 3 attention strategies to decrease cognitive load/fatigue across 4 sessions.    Time 4    Period Weeks    Status New    Target Date 06/10/21      SLP SHORT TERM GOAL #2   Title Pt will recall 3 memory strategies to increase recall of important  information across 4 sessions.    Time 4    Period Weeks    Status New    Target Date 06/10/21              SLP Long Term Goals - 05/13/21 1438       SLP LONG TERM GOAL #1   Title Pt will report successful use of attention strategies to decrease cognitive load/fatigue.    Time 8    Period Weeks    Status New    Target Date 07/08/21      SLP LONG TERM GOAL #2   Title Pt will report successful use of memory strategies to increase ability to recall important information.    Time 8    Period Weeks    Status New    Target Date 07/08/21              Plan - 06/03/21 1641     Clinical Impression Statement See tx note. Pt is receptive to strategies provided. Cont with current POC.    Speech Therapy Frequency 1x /week    Duration 8 weeks    Treatment/Interventions Patient/family education;SLP instruction and feedback;Functional tasks;Internal/external aids;Compensatory techniques;Environmental controls;Compensatory  strategies;Multimodal communcation approach    Potential to Achieve Goals Good    Consulted and Agree with Plan of Care Patient;Family member/caregiver             [name]:   Cognitive communication deficit    Problem List Patient Active Problem List   Diagnosis Date Noted   Chronic hiccups 05/01/2021   Memory loss 05/01/2021   History of COVID-19 05/01/2021   Olfactory impairment 05/01/2021   Senile purpura ([place]) 11/15/2020   Difficulty with speech 07/23/2020   SOB (shortness of breath) 07/23/2020   Dyslipidemia 27/61/4709   Pityriasis lichenoides chronica 09/18/2019   Insomnia 08/07/2019   Anxiety 08/07/2019   Psoriatic arthritis ([place]) 08/07/2019   Gout 08/07/2019   Recurrent UTI 08/07/2019   Charcot's joint of left foot 08/07/2019   Depression 08/07/2019   Pneumonia due to COVID-19 virus 07/09/2019   Physical deconditioning 07/08/2019   COVID-19 07/07/2019   Diabetic peripheral neuropathy ([place]) 04/10/2019   Pain in left knee 10/13/2018   Ischemic colitis ([place]) 10/06/2018   Urinary incontinence 07/21/2018   Diabetic foot ulcer ([place]) 07/13/2018   DM2 (diabetes mellitus, type 2) ([place]) 05/02/2018   Psoriasis 05/02/2018   Osteoarthritis of knee 10/18/2017   Right bundle branch block 05/20/2014   OSA (obstructive sleep apnea), BiPAP 03/12/2013   Essential hypertension 03/12/2013   Obesity 09/14/2012   Hyponatremia 09/14/2012   S/P right THA, AA 09/13/2012   Deep venous thrombosis of lower extremity ([place]) 08/10/2010   Lymphocytic-plasmacytic colitis 08/11/2007    Verdene Lennert MS, [place], CBIS  06/03/2021, 4:42 PM  [place]. [place], [place], [address] Phone: [phone]   Fax:  [phone]   Name: [name] [name] MRN: [id number] Date of Birth: 06-05-1946

## 2021-06-03 NOTE — Therapy (Signed)
[place]. [place], [place], [address] Phone: [phone]   Fax:  [phone]  Physical Therapy Treatment  Patient Details  Name: [name] [name] MRN: [id number] Date of Birth: [birth year] No data recorded  Encounter Date: 06/03/2021   PT End of Session - 06/03/21 1526     Visit Number 5    Number of Visits 13    Date for PT Re-Evaluation 06/24/21    PT Start Time 9563    PT Stop Time 8756    PT Time Calculation (min) 41 min    Activity Tolerance Patient tolerated treatment well    Behavior During Therapy [facility] for tasks assessed/performed             Past Medical History:  Diagnosis Date   Arthritis    Closed fracture of fifth metatarsal bone 05/13/2018   Deep vein thrombosis ([place])    right calf - 05/2012    Diabetes mellitus without complication ([place])    diet controlled    GERD (gastroesophageal reflux disease)    Hyperlipidemia    Hypertension    Ejection fraction =>55% Left ventricular systolic function is normal. Left ventricular wall motion is normal     Lymphocytic colitis    Neuropathy    diabetic - in bilateral feet    Pityriasis lichenoides chronica    Sleep apnea    bipap    Past Surgical History:  Procedure Laterality Date   DILATION AND CURETTAGE OF UTERUS     EYE SURGERY  07/2020   HAMMER TOE SURGERY     right hand surgery      due to blood infection    torn meniscus repair      right knee    TOTAL HIP ARTHROPLASTY  09/13/2012   Procedure: TOTAL HIP ARTHROPLASTY ANTERIOR APPROACH;  Surgeon: Mauri Pole, MD;  Location: [facility];  Service: Orthopedics;  Laterality: Right;    There were no vitals filed for this visit.   Subjective Assessment - 06/03/21 1456     Subjective Patient reports that she is still having LBP, she reports tha tshe feels it happened with her first PT session    Currently in Pain? Yes    Pain Score 4     Pain Location Back    Pain Orientation Lower     Pain Descriptors / Indicators Aching    Aggravating Factors  twisting, walking, standing    Pain Relieving Factors rest and tylenol                               [facility] Adult PT Treatment/Exercise - 06/03/21 0001       Lumbar Exercises: Seated   Long Arc Quad on Chair Both;2 sets;10 reps    LAQ on Chair Weights (lbs) 2    Other Seated Lumbar Exercises ball b/n knees squeeze 5 sec hold x 10, isometric abs with pball in lap 5 sec hold x 10; OH lift with plyo ball x 10    Other Seated Lumbar Exercises marching 2x10 2#      Knee/Hip Exercises: Aerobic   Nustep L5 x 6 min      Knee/Hip Exercises: Seated   Clamshell with TheraBand Red   20 reps   Other Seated Knee/Hip Exercises red and green tband seated row 10 ea    Other Seated Knee/Hip Exercises red tband ankle PF/DF  Hamstring Curl Both;2 sets;10 reps    Hamstring Limitations Green band      Ankle Exercises: Seated   Other Seated Ankle Exercises green tband PF/DF 2x10, red tband inversion and eversion                       PT Short Term Goals - 05/16/21 1036       PT SHORT TERM GOAL #1   Title I with basic HEP    Baseline Program modified to imporve her tolerance.    Time 3    Period Weeks    Status Not Met               PT Long Term Goals - 06/03/21 1528       PT LONG TERM GOAL #1   Title Improve motor strength in BLE to 4+/5, with good endurance to tolerate at least 2 x 20 reps of all strenht exercises.    Status On-going      PT LONG TERM GOAL #2   Title Improve 5x sit to stand time to 12 seconds from normal test height surface, in order to improve ability to rise from restaraunt chairs iwthout use of armrests.    Status On-going                   Plan - 06/03/21 1527     Clinical Impression Statement patient tolerated seated exercises well, i added green tband PF/DF and red tband inversion and eversion.  Has some back pain and I put a heating pad on her while  doing the seated exercises, she does require a chair with arms due to inability to get up without hands    PT Next Visit Plan will need to very slowly look at progression or help with getting her to a water based program    Consulted and Agree with Plan of Care Patient             [name]:  Abnormal gait, Decreased balance, Decreased endurance, Decreased mobility, Difficulty walking, Impaired sensation, Improper body mechanics, Cardiopulmonary status limiting activity, Decreased activity tolerance, Decreased strength, Pain, Postural dysfunction  Visit Diagnosis: Muscle weakness (generalized)  Difficulty in walking, not elsewhere classified  Cognitive communication deficit  Other lack of coordination     Problem List Patient Active Problem List   Diagnosis Date Noted   Chronic hiccups 05/01/2021   Memory loss 05/01/2021   History of COVID-19 05/01/2021   Olfactory impairment 05/01/2021   Senile purpura (HCC) 11/15/2020   Difficulty with speech 07/23/2020   SOB (shortness of breath) 07/23/2020   Dyslipidemia 11/12/2019   Pityriasis lichenoides chronica 09/18/2019   Insomnia 08/07/2019   Anxiety 08/07/2019   Psoriatic arthritis (HCC) 08/07/2019   Gout 08/07/2019   Recurrent UTI 08/07/2019   Charcot's joint of left foot 08/07/2019   Depression 08/07/2019   Pneumonia due to COVID-19 virus 07/09/2019   Physical deconditioning 07/08/2019   COVID-19 07/07/2019   Diabetic peripheral neuropathy (HCC) 04/10/2019   Pain in left knee 10/13/2018   Ischemic colitis (HCC) 10/06/2018   Urinary incontinence 07/21/2018   Diabetic foot ulcer (HCC) 07/13/2018   DM2 (diabetes mellitus, type 2) (HCC) 05/02/2018   Psoriasis 05/02/2018   Osteoarthritis of knee 10/18/2017   Right bundle branch block 05/20/2014   OSA (obstructive sleep apnea), BiPAP 03/12/2013   Essential  hypertension 03/12/2013  Obesity 09/14/2012   Hyponatremia 09/14/2012   S/P right THA, AA 09/13/2012   Deep venous thrombosis of lower extremity ([place]) 08/10/2010   Lymphocytic-plasmacytic colitis 08/11/2007    Sumner Boast, PT 06/03/2021, 4:07 PM  [place]. [place], [place], [address] Phone: [phone]   Fax:  [phone]  Name: [name] [name] MRN: [id number] Date of Birth: 20-Aug-1945

## 2021-06-05 ENCOUNTER — Ambulatory Visit: Payer: Medicare Other | Admitting: Physical Therapy

## 2021-06-05 ENCOUNTER — Encounter: Payer: Self-pay | Admitting: Physical Therapy

## 2021-06-05 ENCOUNTER — Other Ambulatory Visit: Payer: Self-pay

## 2021-06-05 DIAGNOSIS — R41841 Cognitive communication deficit: Secondary | ICD-10-CM | POA: Diagnosis not present

## 2021-06-05 DIAGNOSIS — R262 Difficulty in walking, not elsewhere classified: Secondary | ICD-10-CM | POA: Diagnosis not present

## 2021-06-05 DIAGNOSIS — M6281 Muscle weakness (generalized): Secondary | ICD-10-CM

## 2021-06-05 DIAGNOSIS — R278 Other lack of coordination: Secondary | ICD-10-CM | POA: Diagnosis not present

## 2021-06-05 NOTE — Therapy (Signed)
[place]. [place], [place], [address] Phone: [phone]   Fax:  [phone]  Physical Therapy Treatment  Patient Details  Name: [name] [name] MRN: [id number] Date of Birth: [birth year] No data recorded  Encounter Date: 06/05/2021   PT End of Session - 06/05/21 1018     Visit Number 6    Number of Visits 13    Date for PT Re-Evaluation 06/24/21    PT Start Time 1018    PT Stop Time 1100    PT Time Calculation (min) 42 min    Activity Tolerance Patient tolerated treatment well    Behavior During Therapy [facility] for tasks assessed/performed             Past Medical History:  Diagnosis Date   Arthritis    Closed fracture of fifth metatarsal bone 05/13/2018   Deep vein thrombosis ([place])    right calf - 05/2012    Diabetes mellitus without complication ([place])    diet controlled    GERD (gastroesophageal reflux disease)    Hyperlipidemia    Hypertension    Ejection fraction =>55% Left ventricular systolic function is normal. Left ventricular wall motion is normal     Lymphocytic colitis    Neuropathy    diabetic - in bilateral feet    Pityriasis lichenoides chronica    Sleep apnea    bipap    Past Surgical History:  Procedure Laterality Date   DILATION AND CURETTAGE OF UTERUS     EYE SURGERY  07/2020   HAMMER TOE SURGERY     right hand surgery      due to blood infection    torn meniscus repair      right knee    TOTAL HIP ARTHROPLASTY  09/13/2012   Procedure: TOTAL HIP ARTHROPLASTY ANTERIOR APPROACH;  Surgeon: Mauri Pole, MD;  [facility];  Service: Orthopedics;  Laterality: Right;    There were no vitals filed for this visit.   Subjective Assessment - 06/05/21 1019     Subjective 2/10 back pain today but it's getting better.    Pertinent History Covid 2020, B PN, swallowing issues    Patient Stated Goals Walk without AD for longer distances.    Currently in Pain? Yes    Pain Score 2      Pain Location Back    Pain Orientation Lower                               [facility] Adult PT Treatment/Exercise - 06/05/21 0001       Lumbar Exercises: Seated   Long Arc Quad on Chair Both;2 sets;10 reps    LAQ on Chair Weights (lbs) 2: increase next visit    Other Seated Lumbar Exercises ball b/n knees squeeze 5 sec hold x 25, isometric abs with pball in lap 5 sec hold x 10; OH lift with plyo ball x 10    Other Seated Lumbar Exercises marching 2x10 2#      Knee/Hip Exercises: Aerobic   Nustep L5 x 10 min      Knee/Hip Exercises: Seated   Clamshell with TheraBand Green   2x10   Other Seated Knee/Hip Exercises green band 2x10    Other Seated Knee/Hip Exercises red tband ankle PF/DF x 10 ea bil; heel raises 2x10    Hamstring Curl Both;2 sets;10 reps    Hamstring Limitations Green  band                       PT Short Term Goals - 05/16/21 1036       PT SHORT TERM GOAL #1   Title I with basic HEP    Baseline Program modified to imporve her tolerance.    Time 3    Period Weeks    Status Not Met               PT Long Term Goals - 06/03/21 1528       PT LONG TERM GOAL #1   Title Improve motor strength in BLE to 4+/5, with good endurance to tolerate at least 2 x 20 reps of all strenht exercises.    Status On-going      PT LONG TERM GOAL #2   Title Improve 5x sit to stand time to 12 seconds from normal test height surface, in order to improve ability to rise from restaraunt chairs iwthout use of armrests.    Status On-going                   Plan - 06/05/21 1053     Clinical Impression Statement patient able to progress resistance today with exercises. Back pain is decreasing.    PT Treatment/Interventions ADLs/Self Care Home Management;Cryotherapy;Electrical Stimulation;Moist Heat;DME Lexicographer;Therapeutic exercise;Balance training;Neuromuscular re-education;Patient/family education;Dry needling    PT Next  Visit Plan will need to very slowly look at progression or help with getting her to a water based program             [name]:  Abnormal gait, Decreased balance, Decreased endurance, Decreased mobility, Difficulty walking, Impaired sensation, Improper body mechanics, Cardiopulmonary status limiting activity, Decreased activity tolerance, Decreased strength, Pain, Postural dysfunction  Visit Diagnosis: Muscle weakness (generalized)  Difficulty in walking, not elsewhere classified     Problem List Patient Active Problem List   Diagnosis Date Noted   Chronic hiccups 05/01/2021   Memory loss 05/01/2021   History of COVID-19 05/01/2021   Olfactory impairment 05/01/2021   Senile purpura ([place]) 11/15/2020   Difficulty with speech 07/23/2020   SOB (shortness of breath) 07/23/2020   Dyslipidemia 62/10/5595   Pityriasis lichenoides chronica 09/18/2019   Insomnia 08/07/2019   Anxiety 08/07/2019   Psoriatic arthritis ([place]) 08/07/2019   Gout 08/07/2019   Recurrent UTI 08/07/2019   Charcot's joint of left foot 08/07/2019   Depression 08/07/2019   Pneumonia due to COVID-19 virus 07/09/2019   Physical deconditioning 07/08/2019   COVID-19 07/07/2019   Diabetic peripheral neuropathy ([place]) 04/10/2019   Pain in left knee 10/13/2018   Ischemic colitis ([place]) 10/06/2018   Urinary incontinence 07/21/2018   Diabetic foot ulcer ([place]) 07/13/2018   DM2 (diabetes mellitus, type 2) ([place]) 05/02/2018   Psoriasis 05/02/2018   Osteoarthritis of knee 10/18/2017   Right bundle branch block 05/20/2014   OSA (obstructive sleep apnea), BiPAP 03/12/2013   Essential hypertension 03/12/2013   Obesity 09/14/2012   Hyponatremia 09/14/2012   S/P right THA, AA 09/13/2012   Deep venous thrombosis of lower extremity ([place]) 08/10/2010   Lymphocytic-plasmacytic colitis 08/11/2007   Madelyn Flavors,  PT 06/05/2021, 10:58 AM  [place]. [place], [place], [address] Phone: [phone]   Fax:  [phone]  Name: [name] [name] MRN: [id number] Date of Birth: Feb 01, 1946

## 2021-06-12 ENCOUNTER — Ambulatory Visit: Payer: Medicare Other | Admitting: Speech Pathology

## 2021-06-12 ENCOUNTER — Ambulatory Visit: Payer: Medicare Other | Admitting: Physical Therapy

## 2021-06-12 ENCOUNTER — Other Ambulatory Visit: Payer: Self-pay | Admitting: Internal Medicine

## 2021-06-16 ENCOUNTER — Ambulatory Visit: Payer: Medicare Other | Admitting: Speech Pathology

## 2021-06-16 ENCOUNTER — Ambulatory Visit: Payer: Medicare Other | Attending: Nurse Practitioner | Admitting: Physical Therapy

## 2021-06-16 ENCOUNTER — Encounter: Payer: Self-pay | Admitting: Physical Therapy

## 2021-06-16 ENCOUNTER — Other Ambulatory Visit: Payer: Self-pay

## 2021-06-16 ENCOUNTER — Encounter: Payer: Self-pay | Admitting: Speech Pathology

## 2021-06-16 DIAGNOSIS — R41841 Cognitive communication deficit: Secondary | ICD-10-CM | POA: Insufficient documentation

## 2021-06-16 DIAGNOSIS — R4701 Aphasia: Secondary | ICD-10-CM | POA: Insufficient documentation

## 2021-06-16 DIAGNOSIS — R278 Other lack of coordination: Secondary | ICD-10-CM | POA: Insufficient documentation

## 2021-06-16 DIAGNOSIS — M6281 Muscle weakness (generalized): Secondary | ICD-10-CM | POA: Diagnosis not present

## 2021-06-16 DIAGNOSIS — R262 Difficulty in walking, not elsewhere classified: Secondary | ICD-10-CM | POA: Diagnosis not present

## 2021-06-16 NOTE — Therapy (Signed)
[place]. [place], [place], [address] Phone: [phone]   Fax:  [phone]  Speech Language Pathology Treatment  Patient Details  Name: [name] [name] MRN: [id number] Date of Birth: 1945-09-25 Referring Provider (SLP): Lazaro Arms NP   Encounter Date: 06/16/2021   End of Session - 06/16/21 1240     Visit Number 4    Number of Visits 9    Date for SLP Re-Evaluation 08/13/21    SLP Start Time 8916    SLP Stop Time  9450    SLP Time Calculation (min) 39 min    Activity Tolerance Patient tolerated treatment well             Past Medical History:  Diagnosis Date   Arthritis    Closed fracture of fifth metatarsal bone 05/13/2018   Deep vein thrombosis ([place])    right calf - 05/2012    Diabetes mellitus without complication (HCC)    diet controlled    GERD (gastroesophageal reflux disease)    Hyperlipidemia    Hypertension    Ejection fraction =>55% Left ventricular systolic function is normal. Left ventricular wall motion is normal     Lymphocytic colitis    Neuropathy    diabetic - in bilateral feet    Pityriasis lichenoides chronica    Sleep apnea    bipap    Past Surgical History:  Procedure Laterality Date   DILATION AND CURETTAGE OF UTERUS     EYE SURGERY  07/2020   HAMMER TOE SURGERY     right hand surgery      due to blood infection    torn meniscus repair      right knee    TOTAL HIP ARTHROPLASTY  09/13/2012   Procedure: TOTAL HIP ARTHROPLASTY ANTERIOR APPROACH;  Surgeon: Mauri Pole, MD;  [facility];  Service: Orthopedics;  Laterality: Right;    There were no vitals filed for this visit.   Subjective Assessment - 06/16/21 1240     Subjective "I am feeling better."    Currently in Pain? No/denies                   ADULT SLP TREATMENT - 06/16/21 1455       General Information   Behavior/Cognition Alert;Cooperative      Treatment Provided   Treatment  provided Cognitive-Linquistic      Cognitive-Linquistic Treatment   Treatment focused on Cognition    Skilled Treatment SLP trained pt on attention strategies to assist pt with difficulties utilizing alternating attention. Pt provided examples and demonstrated understanding of each. Pt reports checklists are most beneficial to assist with cognitive load. SLP reviewed energy conservation strategies with pt to decrease cognitive fatigue. Pt further expressed concerns of "isolation" post-covid. SLP encouraged pt to plan social events in advance to decrease cognitive fatigue. SLP to cont training attention strategies next visit.      Assessment / Recommendations / Plan   Plan Continue with current plan of care      Progression Toward Goals   Progression toward goals Progressing toward goals                SLP Short Term Goals - 06/16/21 1242       SLP SHORT TERM GOAL #1   Title Pt will recall 3 attention strategies to decrease cognitive load/fatigue across 4 sessions.    Time 4    Period Weeks    Status  Achieved    Target Date 06/10/21      SLP SHORT TERM GOAL #2   Title Pt will recall 3 memory strategies to increase recall of important information across 4 sessions.    Time 4    Period Weeks    Status Achieved    Target Date 06/10/21              SLP Long Term Goals - 05/13/21 1438       SLP LONG TERM GOAL #1   Title Pt will report successful use of attention strategies to decrease cognitive load/fatigue.    Time 8    Period Weeks    Status New    Target Date 07/08/21      SLP LONG TERM GOAL #2   Title Pt will report successful use of memory strategies to increase ability to recall important information.    Time 8    Period Weeks    Status New    Target Date 07/08/21              Plan - 06/16/21 1241     Clinical Impression Statement See tx note. Pt is receptive to strategies provided. Cont with current POC.    Speech Therapy Frequency 1x /week     Duration 8 weeks    Treatment/Interventions Patient/family education;SLP instruction and feedback;Functional tasks;Internal/external aids;Compensatory techniques;Environmental controls;Compensatory strategies;Multimodal communcation approach    Potential to Achieve Goals Good    Consulted and Agree with Plan of Care Patient;Family member/caregiver             [name]:   Cognitive communication deficit    Problem List Patient Active Problem List   Diagnosis Date Noted   Chronic hiccups 05/01/2021   Memory loss 05/01/2021   History of COVID-19 05/01/2021   Olfactory impairment 05/01/2021   Senile purpura ([place]) 11/15/2020   Difficulty with speech 07/23/2020   SOB (shortness of breath) 07/23/2020   Dyslipidemia 61/68/3729   Pityriasis lichenoides chronica 09/18/2019   Insomnia 08/07/2019   Anxiety 08/07/2019   Psoriatic arthritis ([place]) 08/07/2019   Gout 08/07/2019   Recurrent UTI 08/07/2019   Charcot's joint of left foot 08/07/2019   Depression 08/07/2019   Pneumonia due to COVID-19 virus 07/09/2019   Physical deconditioning 07/08/2019   COVID-19 07/07/2019   Diabetic peripheral neuropathy ([place]) 04/10/2019   Pain in left knee 10/13/2018   Ischemic colitis ([place]) 10/06/2018   Urinary incontinence 07/21/2018   Diabetic foot ulcer ([place]) 07/13/2018   DM2 (diabetes mellitus, type 2) ([place]) 05/02/2018   Psoriasis 05/02/2018   Osteoarthritis of knee 10/18/2017   Right bundle branch block 05/20/2014   OSA (obstructive sleep apnea), BiPAP 03/12/2013   Essential hypertension 03/12/2013   Obesity 09/14/2012   Hyponatremia 09/14/2012   S/P right THA, AA 09/13/2012   Deep venous thrombosis of lower extremity ([place]) 08/10/2010   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 Communication Sciences and Disorders  06/16/2021, 3:03 PM  [place]. [place], [place], [address] Phone: [phone]   Fax:  [phone]   Name: [name] [name] MRN: [id number] Date of Birth: 1945/11/20

## 2021-06-16 NOTE — Therapy (Signed)
[name]. [name], [place], [address] Phone: [phone]   Fax:  [phone]  Physical Therapy Treatment  Patient Details  Name: [name] [name] MRN: [id number] Date of Birth: December 03, 1945 No data recorded  Encounter Date: 06/16/2021   PT End of Session - 06/16/21 1425     Visit Number 7    Number of Visits 13    Date for PT Re-Evaluation 06/24/21    PT Start Time 1315    PT Stop Time 1400    PT Time Calculation (min) 45 min    Activity Tolerance Patient tolerated treatment well    Behavior During Therapy [facility] for tasks assessed/performed             Past Medical History:  Diagnosis Date   Arthritis    Closed fracture of fifth metatarsal bone 05/13/2018   Deep vein thrombosis ([place])    right calf - 05/2012    Diabetes mellitus without complication ([place])    diet controlled    GERD (gastroesophageal reflux disease)    Hyperlipidemia    Hypertension    Ejection fraction =>55% Left ventricular systolic function is normal. Left ventricular wall motion is normal     Lymphocytic colitis    Neuropathy    diabetic - in bilateral feet    Pityriasis lichenoides chronica    Sleep apnea    bipap    Past Surgical History:  Procedure Laterality Date   DILATION AND CURETTAGE OF UTERUS     EYE SURGERY  07/2020   HAMMER TOE SURGERY     right hand surgery      due to blood infection    torn meniscus repair      right knee    TOTAL HIP ARTHROPLASTY  09/13/2012   Procedure: TOTAL HIP ARTHROPLASTY ANTERIOR APPROACH;  Surgeon: [name] Pole, MD;  Location: [facility];  Service: Orthopedics;  Laterality: Right;    There were no vitals filed for this visit.   Subjective Assessment - 06/16/21 1319     Subjective i think I am walking better, still some LBP    Currently in Pain? Yes    Pain Score 2     Pain Location Back    Pain Orientation Lower    Aggravating Factors  activity                                [facility] Adult PT Treatment/Exercise - 06/16/21 0001       Ambulation/Gait   Gait Comments walk no device with SBA x 120 feet, reports fear mostly, did use the wall twice      Knee/Hip Exercises: Aerobic   Nustep L5 x 10 min      Knee/Hip Exercises: Seated   Long Arc Quad Both;2 sets;10 reps    Long Arc Quad Weight 2 lbs.    Ball Squeeze 20    Clamshell with [employer]    Other Seated Knee/Hip Exercises hip abduction [name] tband    Other Seated Knee/Hip Exercises red tband ankle PF/DF x 10 ea bil; heel raises 2x10    Marching Both;2 sets;10 reps    Marching Weights 2 lbs.    Hamstring Curl Both;2 sets;10 reps    Hamstring Limitations [name] band      Ankle Exercises: Seated   Other Seated Ankle Exercises [name] tband PF/DF 2x10, red tband inversion and eversion  PT Short Term Goals - 05/16/21 1036       PT SHORT TERM GOAL #1   Title I with basic HEP    Baseline Program modified to imporve her tolerance.    Time 3    Period Weeks    Status Not Met               PT Long Term Goals - 06/16/21 1427       PT LONG TERM GOAL #1   Title Improve motor strength in BLE to 4+/5, with good endurance to tolerate at least 2 x 20 reps of all strenht exercises.    Status On-going      PT LONG TERM GOAL #2   Title Improve 5x sit to stand time to 12 seconds from normal test height surface, in order to improve ability to rise from restaraunt chairs iwthout use of armrests.    Status On-going      PT LONG TERM GOAL #3   Title Patient will ambulate functionally x at least 500', on level and unlevel surfaces with LRAD, MI.    Status On-going                   Plan - 06/16/21 1425     Clinical Impression Statement I tried patient walking without device today, she reports more of a mental and fear type thing, she used the walls twice for stability, this seemd again due to fear.  She is giving good  effort with the exercises and has not had much pain    PT Next Visit Plan conmtinue to work on her stability and function, may need to do renewal next week    Consulted and Agree with Plan of Care Patient             [name]:  Abnormal gait, Decreased balance, Decreased endurance, Decreased mobility, Difficulty walking, Impaired sensation, Improper body mechanics, Cardiopulmonary status limiting activity, Decreased activity tolerance, Decreased strength, Pain, Postural dysfunction  Visit Diagnosis: Muscle weakness (generalized)  Difficulty in walking, not elsewhere classified     Problem List Patient Active Problem List   Diagnosis Date Noted   Chronic hiccups 05/01/2021   Memory loss 05/01/2021   History of COVID-19 05/01/2021   Olfactory impairment 05/01/2021   Senile purpura ([place]) 11/15/2020   Difficulty with speech 07/23/2020   SOB (shortness of breath) 07/23/2020   Dyslipidemia 95/63/8756   Pityriasis lichenoides chronica 09/18/2019   Insomnia 08/07/2019   Anxiety 08/07/2019   Psoriatic arthritis ([place]) 08/07/2019   Gout 08/07/2019   Recurrent UTI 08/07/2019   Charcot's joint of left foot 08/07/2019   Depression 08/07/2019   Pneumonia due to COVID-19 virus 07/09/2019   Physical deconditioning 07/08/2019   COVID-19 07/07/2019   Diabetic peripheral neuropathy ([place]) 04/10/2019   Pain in left knee 10/13/2018   Ischemic colitis ([place]) 10/06/2018   Urinary incontinence 07/21/2018   Diabetic foot ulcer ([place]) 07/13/2018   DM2 (diabetes mellitus, type 2) ([place]) 05/02/2018   Psoriasis 05/02/2018   Osteoarthritis of knee 10/18/2017   Right bundle branch block 05/20/2014   OSA (obstructive sleep apnea), BiPAP 03/12/2013   Essential hypertension 03/12/2013   Obesity 09/14/2012   Hyponatremia 09/14/2012   S/P right THA, AA 09/13/2012   Deep venous thrombosis of  lower extremity ([place]) 08/10/2010   Lymphocytic-plasmacytic colitis 08/11/2007    Sumner Boast, PT 06/16/2021, 2:28 PM  [facility]  [place] [place], [place], [address] Phone: [phone]   Fax:  [phone]  Name: [name] [name] MRN: [id number] Date of Birth: 10/26/1945

## 2021-06-18 ENCOUNTER — Encounter: Payer: Self-pay | Admitting: Physical Therapy

## 2021-06-18 ENCOUNTER — Ambulatory Visit: Payer: Medicare Other | Admitting: Physical Therapy

## 2021-06-18 ENCOUNTER — Other Ambulatory Visit: Payer: Self-pay

## 2021-06-18 DIAGNOSIS — R41841 Cognitive communication deficit: Secondary | ICD-10-CM | POA: Diagnosis not present

## 2021-06-18 DIAGNOSIS — R278 Other lack of coordination: Secondary | ICD-10-CM

## 2021-06-18 DIAGNOSIS — R262 Difficulty in walking, not elsewhere classified: Secondary | ICD-10-CM | POA: Diagnosis not present

## 2021-06-18 DIAGNOSIS — M6281 Muscle weakness (generalized): Secondary | ICD-10-CM | POA: Diagnosis not present

## 2021-06-18 DIAGNOSIS — R4701 Aphasia: Secondary | ICD-10-CM | POA: Diagnosis not present

## 2021-06-18 NOTE — Therapy (Signed)
[place]. [place], [place], [address] Phone: [phone]   Fax:  [phone]  Physical Therapy Treatment  Patient Details  Name: [name] [name] MRN: [id number] Date of Birth: April 23, 1946 No data recorded  Encounter Date: 06/18/2021   PT End of Session - 06/18/21 1403     Visit Number 8    Number of Visits 13    Date for PT Re-Evaluation 06/24/21    PT Start Time 1312    PT Stop Time 1400    PT Time Calculation (min) 48 min    Activity Tolerance Patient tolerated treatment well    Behavior During Therapy [facility] for tasks assessed/performed             Past Medical History:  Diagnosis Date   Arthritis    Closed fracture of fifth metatarsal bone 05/13/2018   Deep vein thrombosis ([place])    right calf - 05/2012    Diabetes mellitus without complication ([place])    diet controlled    GERD (gastroesophageal reflux disease)    Hyperlipidemia    Hypertension    Ejection fraction =>55% Left ventricular systolic function is normal. Left ventricular wall motion is normal     Lymphocytic colitis    Neuropathy    diabetic - in bilateral feet    Pityriasis lichenoides chronica    Sleep apnea    bipap    Past Surgical History:  Procedure Laterality Date   DILATION AND CURETTAGE OF UTERUS     EYE SURGERY  07/2020   HAMMER TOE SURGERY     right hand surgery      due to blood infection    torn meniscus repair      right knee    TOTAL HIP ARTHROPLASTY  09/13/2012   Procedure: TOTAL HIP ARTHROPLASTY ANTERIOR APPROACH;  Surgeon: Mauri Pole, MD;  Location: [facility];  Service: Orthopedics;  Laterality: Right;    There were no vitals filed for this visit.   Subjective Assessment - 06/18/21 1339     Subjective I just feel stiff    Currently in Pain? Yes    Pain Score 4     Pain Location Back    Pain Orientation Lower    Pain Descriptors / Indicators Tightness    Aggravating Factors  activity                                [facility] Adult PT Treatment/Exercise - 06/18/21 0001       Ambulation/Gait   Gait Comments with SPC x 200 feet      Lumbar Exercises: Supine   Other Supine Lumbar Exercises feet on ball K2C, trunk rotation, posterior activation, isometric abs      Knee/Hip Exercises: Aerobic   Nustep L5 x 7 min      Knee/Hip Exercises: Seated   Long Arc Quad Both;2 sets;10 reps    Long Arc Quad Weight 2 lbs.    Ball Squeeze 20    Clamshell with [employer]    Other Seated Knee/Hip Exercises red tband ankle PF/DF x 10 ea bil; heel raises 2x10    Marching Both;2 sets;10 reps    Marching Weights 2 lbs.      Knee/Hip Exercises: Supine   Short Arc Quad Sets Both;2 sets;10 reps    Short Arc Quad Sets Limitations 2.5#  PT Short Term Goals - 05/16/21 1036       PT SHORT TERM GOAL #1   Title I with basic HEP    Baseline Program modified to imporve her tolerance.    Time 3    Period Weeks    Status Not Met               PT Long Term Goals - 06/16/21 1427       PT LONG TERM GOAL #1   Title Improve motor strength in BLE to 4+/5, with good endurance to tolerate at least 2 x 20 reps of all strenht exercises.    Status On-going      PT LONG TERM GOAL #2   Title Improve 5x sit to stand time to 12 seconds from normal test height surface, in order to improve ability to rise from restaraunt chairs iwthout use of armrests.    Status On-going      PT LONG TERM GOAL #3   Title Patient will ambulate functionally x at least 500', on level and unlevel surfaces with LRAD, MI.    Status On-going                   Plan - 06/18/21 1404     Clinical Impression Statement 200 feet with SPC is about her max due to fatigue/SOB, no c/o pain.  She reports that she feels that she is getting stronger.  I added some supine exercises for her back to help with the back pain and stiffness    PT Next Visit Plan conmtinue to work  on her stability and function, may need to do renewal next week    Consulted and Agree with Plan of Care Patient             [name]:  Abnormal gait, Decreased balance, Decreased endurance, Decreased mobility, Difficulty walking, Impaired sensation, Improper body mechanics, Cardiopulmonary status limiting activity, Decreased activity tolerance, Decreased strength, Pain, Postural dysfunction  Visit Diagnosis: Muscle weakness (generalized)  Difficulty in walking, not elsewhere classified  Other lack of coordination     Problem List Patient Active Problem List   Diagnosis Date Noted   Chronic hiccups 05/01/2021   Memory loss 05/01/2021   History of COVID-19 05/01/2021   Olfactory impairment 05/01/2021   Senile purpura ([place]) 11/15/2020   Difficulty with speech 07/23/2020   SOB (shortness of breath) 07/23/2020   Dyslipidemia 62/37/6283   Pityriasis lichenoides chronica 09/18/2019   Insomnia 08/07/2019   Anxiety 08/07/2019   Psoriatic arthritis ([place]) 08/07/2019   Gout 08/07/2019   Recurrent UTI 08/07/2019   Charcot's joint of left foot 08/07/2019   Depression 08/07/2019   Pneumonia due to COVID-19 virus 07/09/2019   Physical deconditioning 07/08/2019   COVID-19 07/07/2019   Diabetic peripheral neuropathy ([place]) 04/10/2019   Pain in left knee 10/13/2018   Ischemic colitis ([place]) 10/06/2018   Urinary incontinence 07/21/2018   Diabetic foot ulcer ([place]) 07/13/2018   DM2 (diabetes mellitus, type 2) ([place]) 05/02/2018   Psoriasis 05/02/2018   Osteoarthritis of knee 10/18/2017   Right bundle branch block 05/20/2014   OSA (obstructive sleep apnea), BiPAP 03/12/2013   Essential hypertension 03/12/2013   Obesity 09/14/2012   Hyponatremia 09/14/2012   S/P right THA, AA 09/13/2012   Deep venous thrombosis of lower extremity ([place]) 08/10/2010   Lymphocytic-plasmacytic  colitis 08/11/2007    Sumner Boast, PT 06/18/2021, 2:09 PM  [place]. [place], [place], [address] Phone: [phone]   Fax:  [phone]  Name: [name] [name] MRN: [id number] Date of Birth: 1946-02-21

## 2021-06-23 ENCOUNTER — Encounter: Payer: Self-pay | Admitting: Physical Therapy

## 2021-06-23 ENCOUNTER — Encounter: Payer: Self-pay | Admitting: Speech Pathology

## 2021-06-23 ENCOUNTER — Other Ambulatory Visit: Payer: Self-pay

## 2021-06-23 ENCOUNTER — Ambulatory Visit: Payer: Medicare Other | Admitting: Speech Pathology

## 2021-06-23 ENCOUNTER — Ambulatory Visit: Payer: Medicare Other | Admitting: Physical Therapy

## 2021-06-23 DIAGNOSIS — M6281 Muscle weakness (generalized): Secondary | ICD-10-CM | POA: Diagnosis not present

## 2021-06-23 DIAGNOSIS — R262 Difficulty in walking, not elsewhere classified: Secondary | ICD-10-CM

## 2021-06-23 DIAGNOSIS — R278 Other lack of coordination: Secondary | ICD-10-CM

## 2021-06-23 DIAGNOSIS — R41841 Cognitive communication deficit: Secondary | ICD-10-CM

## 2021-06-23 DIAGNOSIS — R4701 Aphasia: Secondary | ICD-10-CM | POA: Diagnosis not present

## 2021-06-23 NOTE — Therapy (Addendum)
[place]. [place], [place], [address] Phone: [phone]   Fax:  [phone]  Speech Language Pathology Treatment and Discharge Summary  Patient Details  Name: [name] [name] MRN: [id number] Date of Birth: 03/29/1946 Referring Provider (SLP): Lazaro Arms NP   Encounter Date: 06/23/2021   End of Session - 06/23/21 1319     Visit Number 5    Number of Visits 9    Date for SLP Re-Evaluation 08/13/21    SLP Start Time 2355    SLP Stop Time  7322    SLP Time Calculation (min) 47 min    Activity Tolerance Patient tolerated treatment well             Past Medical History:  Diagnosis Date   Arthritis    Closed fracture of fifth metatarsal bone 05/13/2018   Deep vein thrombosis ([place])    right calf - 05/2012    Diabetes mellitus without complication ([facility])    diet controlled    GERD (gastroesophageal reflux disease)    Hyperlipidemia    Hypertension    Ejection fraction =>55% Left ventricular systolic function is normal. Left ventricular wall motion is normal     Lymphocytic colitis    Neuropathy    diabetic - in bilateral feet    Pityriasis lichenoides chronica    Sleep apnea    bipap    Past Surgical History:  Procedure Laterality Date   DILATION AND CURETTAGE OF UTERUS     EYE SURGERY  07/2020   HAMMER TOE SURGERY     right hand surgery      due to blood infection    torn meniscus repair      right knee    TOTAL HIP ARTHROPLASTY  09/13/2012   Procedure: TOTAL HIP ARTHROPLASTY ANTERIOR APPROACH;  Surgeon: Mauri Pole, MD;  Location: [facility];  Service: Orthopedics;  Laterality: Right;    There were no vitals filed for this visit.   Subjective Assessment - 06/23/21 1312     Subjective "I feel like I am doing good."    Currently in Pain? No/denies                   ADULT SLP TREATMENT - 06/23/21 1351       General Information   Behavior/Cognition Cooperative;Pleasant  mood;Alert      Treatment Provided   Treatment provided Cognitive-Linquistic      Cognitive-Linquistic Treatment   Treatment focused on Cognition    Skilled Treatment SLP completed training on attention strategies to assist with cognitive fatigue. Pt reports success with utilizing strategies and "feels like everything is going well". Pt and SLP discussed spoon theory and assigned spoons to daily tasks, ranking each daily activity on a scale of 1-5 (1 requiring low energy and 5 requiring max energy). Pt demonstrated an awareness of tasks that require more energy and feels that "life is not as busy as it used to be". Pt is satisfied with progress made. SLP and pt are in agreement for d/c.      Assessment / Recommendations / Plan   Plan Discharge SLP treatment due to (comment);All goals met      Progression Toward Goals   Progression toward goals Goals met, education completed, patient discharged from [facility] - 06/16/21 1242       SLP SHORT  TERM GOAL #1   Title Pt will recall 3 attention strategies to decrease cognitive load/fatigue across 4 sessions.    Time 4    Period Weeks    Status Achieved    Target Date 06/10/21      SLP SHORT TERM GOAL #2   Title Pt will recall 3 memory strategies to increase recall of important information across 4 sessions.    Time 4    Period Weeks    Status Achieved    Target Date 06/10/21              SLP Long Term Goals - 06/23/21 1320       SLP LONG TERM GOAL #1   Title Pt will report successful use of attention strategies to decrease cognitive load/fatigue.    Time 8    Period Weeks    Status Achieved      SLP LONG TERM GOAL #2   Title Pt will report successful use of memory strategies to increase ability to recall important information.    Time 8    Period Weeks    Status Achieved              Plan - 06/23/21 1319     Clinical Impression Statement See tx note. Pt reports she is feeling  confident in d/c and has strategies she feel like she can benefit from. SLP is to d/c from therapy.    Speech Therapy Frequency 1x /week    Duration 8 weeks    Treatment/Interventions Patient/family education;SLP instruction and feedback;Functional tasks;Internal/external aids;Compensatory techniques;Environmental controls;Compensatory strategies;Multimodal communcation approach    Potential to Achieve Goals Good    Consulted and Agree with Plan of Care Patient;Family member/caregiver             [name]:   Aphasia    Problem List Patient Active Problem List   Diagnosis Date Noted   Chronic hiccups 05/01/2021   Memory loss 05/01/2021   History of COVID-19 05/01/2021   Olfactory impairment 05/01/2021   Senile purpura ([place]) 11/15/2020   Difficulty with speech 07/23/2020   SOB (shortness of breath) 07/23/2020   Dyslipidemia 94/70/9628   Pityriasis lichenoides chronica 09/18/2019   Insomnia 08/07/2019   Anxiety 08/07/2019   Psoriatic arthritis ([place]) 08/07/2019   Gout 08/07/2019   Recurrent UTI 08/07/2019   Charcot's joint of left foot 08/07/2019   Depression 08/07/2019   Pneumonia due to COVID-19 virus 07/09/2019   Physical deconditioning 07/08/2019   COVID-19 07/07/2019   Diabetic peripheral neuropathy ([place]) 04/10/2019   Pain in left knee 10/13/2018   Ischemic colitis ([place]) 10/06/2018   Urinary incontinence 07/21/2018   Diabetic foot ulcer ([place]) 07/13/2018   DM2 (diabetes mellitus, type 2) ([place]) 05/02/2018   Psoriasis 05/02/2018   Osteoarthritis of knee 10/18/2017   Right bundle branch block 05/20/2014   OSA (obstructive sleep apnea), BiPAP 03/12/2013   Essential hypertension 03/12/2013   Obesity 09/14/2012   Hyponatremia 09/14/2012   S/P right THA, AA 09/13/2012   Deep venous thrombosis of lower extremity ([place]) 08/10/2010   Lymphocytic-plasmacytic colitis  08/11/2007   SPEECH THERAPY DISCHARGE SUMMARY  Visits from Start of Care: 4  Current functional level related to goals / functional outcomes: WFL; pt has met all goals and reports successful use of strategies at home.   Remaining deficits: Mild memory impairment; however, pt is pleased with current functional level and is using strategies  to compensate.   Education / Equipment: Edu completed on memory and attention strategies.   Patient agrees to discharge. Patient goals were met. Patient is being discharged due to meeting the stated rehab goals.Danise Mina B.S. Communication Sciences and Disorders  06/23/2021, 1:57 PM  [facility] [place]. [place], [place], [address] Phone: [phone]   Fax:  [phone]   Name: [name] [name] MRN: [id number] Date of Birth: 1946/07/08

## 2021-06-23 NOTE — Therapy (Signed)
[place]. [place], [place], [address] Phone: [phone]   Fax:  [phone]  Physical Therapy Treatment  Patient Details  Name: [name] [name] MRN: [id number] Date of Birth: 26-Feb-1946 No data recorded  Encounter Date: 06/23/2021   PT End of Session - 06/23/21 1356     Visit Number 9    Date for PT Re-Evaluation 06/24/21    PT Start Time 7867    PT Stop Time 5449    PT Time Calculation (min) 43 min    Activity Tolerance Patient tolerated treatment well    Behavior During Therapy [facility] for tasks assessed/performed             Past Medical History:  Diagnosis Date   Arthritis    Closed fracture of fifth metatarsal bone 05/13/2018   Deep vein thrombosis ([place])    right calf - 05/2012    Diabetes mellitus without complication (HCC)    diet controlled    GERD (gastroesophageal reflux disease)    Hyperlipidemia    Hypertension    Ejection fraction =>55% Left ventricular systolic function is normal. Left ventricular wall motion is normal     Lymphocytic colitis    Neuropathy    diabetic - in bilateral feet    Pityriasis lichenoides chronica    Sleep apnea    bipap    Past Surgical History:  Procedure Laterality Date   DILATION AND CURETTAGE OF UTERUS     EYE SURGERY  07/2020   HAMMER TOE SURGERY     right hand surgery      due to blood infection    torn meniscus repair      right knee    TOTAL HIP ARTHROPLASTY  09/13/2012   Procedure: TOTAL HIP ARTHROPLASTY ANTERIOR APPROACH;  Surgeon: Mauri Pole, MD;  Location: [facility];  Service: Orthopedics;  Laterality: Right;    There were no vitals filed for this visit.   Subjective Assessment - 06/23/21 1321     Subjective My son came by and he had not seen me in over a month, he thought I was doing much better    Currently in Pain? Yes    Pain Score 3     Pain Location Back    Pain Orientation Lower    Aggravating Factors  walking                                [facility] Adult PT Treatment/Exercise - 06/23/21 0001       Ambulation/Gait   Gait Comments with SPC x 200 feet      Lumbar Exercises: Machines for Strengthening   Other Lumbar Machine Exercise 5# rows standing with pulleys x10 needed CGA due to poor balance      Lumbar Exercises: Supine   Other Supine Lumbar Exercises feet on ball K2C, trunk rotation, posterior activation, isometric abs      Knee/Hip Exercises: Aerobic   Nustep L5 x 7 min      Knee/Hip Exercises: Machines for Strengthening   Cybex Knee Flexion 20# 2x10      Knee/Hip Exercises: Standing   Hip Abduction Both;2 sets;10 reps    Abduction Limitations 2#      Knee/Hip Exercises: Seated   Long Arc Quad Both;2 sets;10 reps    Long Arc Quad Weight 3 lbs.    Ball Squeeze 20    Marching Both;2 sets;10  reps    Marching Weights 3 lbs.                       PT Short Term Goals - 05/16/21 1036       PT SHORT TERM GOAL #1   Title I with basic HEP    Baseline Program modified to imporve her tolerance.    Time 3    Period Weeks    Status Not Met               PT Long Term Goals - 06/16/21 1427       PT LONG TERM GOAL #1   Title Improve motor strength in BLE to 4+/5, with good endurance to tolerate at least 2 x 20 reps of all strenht exercises.    Status On-going      PT LONG TERM GOAL #2   Title Improve 5x sit to stand time to 12 seconds from normal test height surface, in order to improve ability to rise from restaraunt chairs iwthout use of armrests.    Status On-going      PT LONG TERM GOAL #3   Title Patient will ambulate functionally x at least 500', on level and unlevel surfaces with LRAD, MI.    Status On-going                   Plan - 06/23/21 1357     Clinical Impression Statement I added machine HS curls, hip abduction with 2# and pulleys for core had difficulty with balance.  She reports feeling and moving better. Still with  significant balance issues without cane    PT Next Visit Plan conmtinue to work on her stability and function, may need to do renewal next week    Consulted and Agree with Plan of Care Patient             [name]:  Abnormal gait, Decreased balance, Decreased endurance, Decreased mobility, Difficulty walking, Impaired sensation, Improper body mechanics, Cardiopulmonary status limiting activity, Decreased activity tolerance, Decreased strength, Pain, Postural dysfunction  Visit Diagnosis: Muscle weakness (generalized)  Difficulty in walking, not elsewhere classified  Other lack of coordination     Problem List Patient Active Problem List   Diagnosis Date Noted   Chronic hiccups 05/01/2021   Memory loss 05/01/2021   History of COVID-19 05/01/2021   Olfactory impairment 05/01/2021   Senile purpura ([place]) 11/15/2020   Difficulty with speech 07/23/2020   SOB (shortness of breath) 07/23/2020   Dyslipidemia 84/16/6063   Pityriasis lichenoides chronica 09/18/2019   Insomnia 08/07/2019   Anxiety 08/07/2019   Psoriatic arthritis ([place]) 08/07/2019   Gout 08/07/2019   Recurrent UTI 08/07/2019   Charcot's joint of left foot 08/07/2019   Depression 08/07/2019   Pneumonia due to COVID-19 virus 07/09/2019   Physical deconditioning 07/08/2019   COVID-19 07/07/2019   Diabetic peripheral neuropathy ([place]) 04/10/2019   Pain in left knee 10/13/2018   Ischemic colitis ([place]) 10/06/2018   Urinary incontinence 07/21/2018   Diabetic foot ulcer ([place]) 07/13/2018   DM2 (diabetes mellitus, type 2) ([place]) 05/02/2018   Psoriasis 05/02/2018   Osteoarthritis of knee 10/18/2017   Right bundle branch block 05/20/2014   OSA (obstructive sleep apnea), BiPAP 03/12/2013   Essential hypertension 03/12/2013   Obesity 09/14/2012   Hyponatremia 09/14/2012   S/P right THA, AA 09/13/2012   Deep venous  thrombosis  of lower extremity ([place]) 08/10/2010   Lymphocytic-plasmacytic colitis 08/11/2007    Sumner Boast, PT 06/23/2021, 1:59 PM  [place]. [place], [place], [address] Phone: [phone]   Fax:  [phone]  Name: [name] [name] MRN: [id number] Date of Birth: 1946/07/14

## 2021-06-24 ENCOUNTER — Encounter: Payer: Self-pay | Admitting: Internal Medicine

## 2021-06-25 ENCOUNTER — Ambulatory Visit: Payer: Medicare Other | Admitting: Physical Therapy

## 2021-06-25 ENCOUNTER — Other Ambulatory Visit: Payer: Self-pay

## 2021-06-25 ENCOUNTER — Encounter: Payer: Self-pay | Admitting: Physical Therapy

## 2021-06-25 DIAGNOSIS — R4701 Aphasia: Secondary | ICD-10-CM | POA: Diagnosis not present

## 2021-06-25 DIAGNOSIS — R262 Difficulty in walking, not elsewhere classified: Secondary | ICD-10-CM

## 2021-06-25 DIAGNOSIS — M6281 Muscle weakness (generalized): Secondary | ICD-10-CM

## 2021-06-25 DIAGNOSIS — R41841 Cognitive communication deficit: Secondary | ICD-10-CM | POA: Diagnosis not present

## 2021-06-25 DIAGNOSIS — R278 Other lack of coordination: Secondary | ICD-10-CM | POA: Diagnosis not present

## 2021-06-25 NOTE — Therapy (Signed)
[place]. [place], [place], [address] Phone: [phone]   Fax:  [phone] Progress Note Reporting Period 05/13/21 to 06/25/21  See note below for Objective Data and Assessment of Progress/Goals.     Physical Therapy Treatment  Patient Details  Name: [name] [name] MRN: [id number] Date of Birth: 02-03-1946 No data recorded  Encounter Date: 06/25/2021   PT End of Session - 06/25/21 1436     Visit Number 10    Date for PT Re-Evaluation 07/25/21    PT Start Time 1319    PT Stop Time 1401    PT Time Calculation (min) 42 min    Activity Tolerance Patient tolerated treatment well    Behavior During Therapy [facility] for tasks assessed/performed             Past Medical History:  Diagnosis Date   Arthritis    Closed fracture of fifth metatarsal bone 05/13/2018   Deep vein thrombosis ([place])    right calf - 05/2012    Diabetes mellitus without complication (HCC)    diet controlled    GERD (gastroesophageal reflux disease)    Hyperlipidemia    Hypertension    Ejection fraction =>55% Left ventricular systolic function is normal. Left ventricular wall motion is normal     Lymphocytic colitis    Neuropathy    diabetic - in bilateral feet    Pityriasis lichenoides chronica    Sleep apnea    bipap    Past Surgical History:  Procedure Laterality Date   DILATION AND CURETTAGE OF UTERUS     EYE SURGERY  07/2020   HAMMER TOE SURGERY     right hand surgery      due to blood infection    torn meniscus repair      right knee    TOTAL HIP ARTHROPLASTY  09/13/2012   Procedure: TOTAL HIP ARTHROPLASTY ANTERIOR APPROACH;  Surgeon: Mauri Pole, MD;  Location: [facility];  Service: Orthopedics;  Laterality: Right;    There were no vitals filed for this visit.   Subjective Assessment - 06/25/21 1327     Subjective I was a lttle sore in my buttocks, feeling ptty good    Currently in Pain? No/denies                 [facility] PT Assessment - 06/25/21 0001       Assessment   Medical Diagnosis debility                           [facility] Adult PT Treatment/Exercise - 06/25/21 0001       Ambulation/Gait   Gait Comments with SPC x 200 feet      High Level Balance   High Level Balance Activities Side stepping;Backward walking;Negotiating over obstacles    High Level Balance Comments ball tossing, cone toe touches with a SPC needed min A due to imbalance, stepping over sticks forward and side stepping      Knee/Hip Exercises: Aerobic   Nustep L5 x 5 min      Knee/Hip Exercises: Machines for Strengthening   Cybex Knee Extension 5# 2x8    Cybex Knee Flexion 20# 2x10    Cybex Leg Press 20# x 8, no weight 2x 8    Other Machine 5# rows 2x10      Knee/Hip Exercises: Seated   Other Seated Knee/Hip Exercises red tband ankle PF/DF 2x10  PT Short Term Goals - 05/16/21 1036       PT SHORT TERM GOAL #1   Title I with basic HEP    Baseline Program modified to imporve her tolerance.    Time 3    Period Weeks    Status Not Met               PT Long Term Goals - 06/25/21 1437       PT LONG TERM GOAL #1   Title Improve motor strength in BLE to 4+/5, with good endurance to tolerate at least 2 x 20 reps of all strenht exercises.    Status On-going      PT LONG TERM GOAL #2   Title Improve 5x sit to stand time to 12 seconds from normal test height surface, in order to improve ability to rise from restaraunt chairs iwthout use of armrests.    Status On-going      PT LONG TERM GOAL #3   Title Patient will ambulate functionally x at least 500', on level and unlevel surfaces with LRAD, MI.    Status On-going      PT LONG TERM GOAL #4   Title Patient will tolerate sitting/ standing activities during 45 minute treatment, with maximum of 4 rest breaks, with no C/O back pain or fatigue.    Status On-going      PT LONG TERM GOAL #5   Title Up  and down at least 5 steps with B rails, MI    Status On-going                   Plan - 06/25/21 1436     Clinical Impression Statement I continued to add strenght with machines and started some higher level balance, difficulty on and off machines, some difficulty with the cone toe touches and with backward walking, c/o fatigue    PT Frequency 2x / week    PT Duration 8 weeks    PT Treatment/Interventions ADLs/Self Care Home Management;Cryotherapy;Electrical Stimulation;Moist Heat;DME Lexicographer;Therapeutic exercise;Balance training;Neuromuscular re-education;Patient/family education;Dry needling    PT Next Visit Plan conmtinue to work on her stability and function    Consulted and Agree with Plan of Care Patient             [name]:  Abnormal gait, Decreased balance, Decreased endurance, Decreased mobility, Difficulty walking, Impaired sensation, Improper body mechanics, Cardiopulmonary status limiting activity, Decreased activity tolerance, Decreased strength, Pain, Postural dysfunction  Visit Diagnosis: Muscle weakness (generalized) - Plan: PT plan of care cert/re-cert  Difficulty in walking, not elsewhere classified - Plan: PT plan of care cert/re-cert     Problem List Patient Active Problem List   Diagnosis Date Noted   Chronic hiccups 05/01/2021   Memory loss 05/01/2021   History of COVID-19 05/01/2021   Olfactory impairment 05/01/2021   Senile purpura ([place]) 11/15/2020   Difficulty with speech 07/23/2020   SOB (shortness of breath) 07/23/2020   Dyslipidemia 28/31/5176   Pityriasis lichenoides chronica 09/18/2019   Insomnia 08/07/2019   Anxiety 08/07/2019   Psoriatic arthritis ([place]) 08/07/2019   Gout 08/07/2019   Recurrent UTI 08/07/2019   Charcot's joint of left foot 08/07/2019   Depression 08/07/2019   Pneumonia due to COVID-19  virus 07/09/2019   Physical deconditioning 07/08/2019   COVID-19 07/07/2019   Diabetic peripheral neuropathy ([place]) 04/10/2019   Pain in left knee 10/13/2018  Ischemic colitis ([place]) 10/06/2018   Urinary incontinence 07/21/2018   Diabetic foot ulcer ([place]) 07/13/2018   DM2 (diabetes mellitus, type 2) ([place]) 05/02/2018   Psoriasis 05/02/2018   Osteoarthritis of knee 10/18/2017   Right bundle branch block 05/20/2014   OSA (obstructive sleep apnea), BiPAP 03/12/2013   Essential hypertension 03/12/2013   Obesity 09/14/2012   Hyponatremia 09/14/2012   S/P right THA, AA 09/13/2012   Deep venous thrombosis of lower extremity ([place]) 08/10/2010   Lymphocytic-plasmacytic colitis 08/11/2007    Sumner Boast, PT 06/25/2021, 2:40 PM  [address]. [place], [place], [address] Phone: [phone]   Fax:  [phone]  Name: [name] [name] MRN: [id number] Date of Birth: 06/02/1946

## 2021-06-27 NOTE — Telephone Encounter (Signed)
Type of form received: [facility]   Form placed in : Provider mailbox  Additional instructions from the patient: fax   Things to remember: [place] office: If form received in person, remind patient that forms take 7-10 business days CMA should attach charge sheet and put on [employer]

## 2021-06-30 DIAGNOSIS — R945 Abnormal results of liver function studies: Secondary | ICD-10-CM | POA: Diagnosis not present

## 2021-06-30 DIAGNOSIS — L4059 Other psoriatic arthropathy: Secondary | ICD-10-CM | POA: Diagnosis not present

## 2021-06-30 DIAGNOSIS — Z79899 Other long term (current) drug therapy: Secondary | ICD-10-CM | POA: Diagnosis not present

## 2021-07-02 ENCOUNTER — Encounter: Payer: Self-pay | Admitting: Internal Medicine

## 2021-07-02 ENCOUNTER — Ambulatory Visit: Payer: Medicare Other | Admitting: Physical Therapy

## 2021-07-07 ENCOUNTER — Other Ambulatory Visit: Payer: Self-pay

## 2021-07-07 ENCOUNTER — Ambulatory Visit: Payer: Medicare Other | Admitting: Speech Pathology

## 2021-07-07 ENCOUNTER — Encounter: Payer: Self-pay | Admitting: Physical Therapy

## 2021-07-07 ENCOUNTER — Ambulatory Visit: Payer: Medicare Other | Admitting: Physical Therapy

## 2021-07-07 DIAGNOSIS — R262 Difficulty in walking, not elsewhere classified: Secondary | ICD-10-CM

## 2021-07-07 DIAGNOSIS — R41841 Cognitive communication deficit: Secondary | ICD-10-CM | POA: Diagnosis not present

## 2021-07-07 DIAGNOSIS — R278 Other lack of coordination: Secondary | ICD-10-CM | POA: Diagnosis not present

## 2021-07-07 DIAGNOSIS — M6281 Muscle weakness (generalized): Secondary | ICD-10-CM | POA: Diagnosis not present

## 2021-07-07 DIAGNOSIS — R4701 Aphasia: Secondary | ICD-10-CM | POA: Diagnosis not present

## 2021-07-07 NOTE — Therapy (Signed)
[name]. [name], [place], [address] Phone: [phone]   Fax:  [phone]  Physical Therapy Treatment  Patient Details  Name: [name] [name] MRN: [id number] Date of Birth: Dec 26, 1945 No data recorded  Encounter Date: 07/07/2021   PT End of Session - 07/07/21 1614     Visit Number 11    Number of Visits 13    Date for PT Re-Evaluation 07/25/21    PT Start Time 1540    PT Stop Time 3734    PT Time Calculation (min) 33 min    Activity Tolerance Patient tolerated treatment well    Behavior During Therapy [facility] for tasks assessed/performed             Past Medical History:  Diagnosis Date   Arthritis    Closed fracture of fifth metatarsal bone 05/13/2018   Deep vein thrombosis ([place])    right calf - 05/2012    Diabetes mellitus without complication ([name])    diet controlled    GERD (gastroesophageal reflux disease)    Hyperlipidemia    Hypertension    Ejection fraction =>55% Left ventricular systolic function is normal. Left ventricular wall motion is normal     Lymphocytic colitis    Neuropathy    diabetic - in bilateral feet    Pityriasis lichenoides chronica    Sleep apnea    bipap    Past Surgical History:  Procedure Laterality Date   DILATION AND CURETTAGE OF UTERUS     EYE SURGERY  07/2020   HAMMER TOE SURGERY     right hand surgery      due to blood infection    torn meniscus repair      right knee    TOTAL HIP ARTHROPLASTY  09/13/2012   Procedure: TOTAL HIP ARTHROPLASTY ANTERIOR APPROACH;  Surgeon: [name] Pole, MD;  Location: [facility];  Service: Orthopedics;  Laterality: Right;    There were no vitals filed for this visit.   Subjective Assessment - 07/07/21 1540     Subjective I'm just getting over a cold, I took a covid test but it was negative. I'd like to work on my balance. I have long covid and I had a wound on my left foot that had to heal- I'd had to be off my foot for about 2  years due to that down time. No falls or close calls with falling since last time I was seen.    Pertinent History Covid 2020, B PN, swallowing issues    Patient Stated Goals Walk without AD for longer distances, improve balance    Currently in Pain? No/denies                               [facility] Adult PT Treatment/Exercise - 07/07/21 0001       Lumbar Exercises: Standing   Heel Raises 10 reps      Lumbar Exercises: Seated   Other Seated Lumbar Exercises anlke inversion, eversion DF with red tb 1x10      Knee/Hip Exercises: Standing   Heel Raises Both;1 set;10 reps                 Balance Exercises - 07/07/21 0001       Balance Exercises: Standing   Standing Eyes Opened Narrow base of support (BOS);Foam/compliant surface;3 reps;30 secs    Standing Eyes Closed Narrow base of support (  BOS);Solid surface;3 reps;30 secs    Tandem Stance Eyes open;3 reps;20 secs    Partial Tandem Stance Eyes open;Intermittent upper extremity support;2 reps   head turns (horizontal)               PT Education - 07/07/21 1614     Education Details exercise form and purpose    Person(s) Educated Patient    Methods Explanation    Comprehension Verbalized understanding              PT Short Term Goals - 05/16/21 1036       PT SHORT TERM GOAL #1   Title I with basic HEP    Baseline Program modified to imporve her tolerance.    Time 3    Period Weeks    Status Not Met               PT Long Term Goals - 06/25/21 1437       PT LONG TERM GOAL #1   Title Improve motor strength in BLE to 4+/5, with good endurance to tolerate at least 2 x 20 reps of all strenht exercises.    Status On-going      PT LONG TERM GOAL #2   Title Improve 5x sit to stand time to 12 seconds from normal test height surface, in order to improve ability to rise from restaraunt chairs iwthout use of armrests.    Status On-going      PT LONG TERM GOAL #3   Title Patient will  ambulate functionally x at least 500', on level and unlevel surfaces with LRAD, MI.    Status On-going      PT LONG TERM GOAL #4   Title Patient will tolerate sitting/ standing activities during 45 minute treatment, with maximum of 4 rest breaks, with no C/O back pain or fatigue.    Status On-going      PT LONG TERM GOAL #5   Title Up and down at least 5 steps with B rails, MI    Status On-going                   Plan - 07/07/21 1615     Clinical Impression Statement [name] [name] arrives a bit late today, requests to work on balance, but needed lots of encouragement- keeps stating "I don't know that I'm ready"; per chart has been working on Patent examiner and other advanced activities. Introduced functional balance training to tolerance today- did need lots of seated rest breaks due to back pain and some fatigue. Did need up on [facility] for balance today- definitely has a hard time with this due to peripheral neuropathy. Also worked on some ankle strengthening per her request. Will continue efforts.    Personal Factors and Comorbidities Age;Behavior Pattern;Fitness;Time since onset of injury/illness/exacerbation;Past/Current Experience;Comorbidity 1;Comorbidity 2    Comorbidities Covid, Foot wound    Examination-Activity Limitations Sit;Transfers;Bend;Squat;Stairs;Carry;Stand;Toileting    Examination-Participation Restrictions Cleaning;Community Activity;Interpersonal Relationship;Laundry;Other    Stability/Clinical Decision Making Evolving/Moderate complexity    Clinical Decision Making Moderate    Rehab Potential Good    PT Frequency 2x / week    PT Duration 8 weeks    PT Treatment/Interventions ADLs/Self Care Home Management;Cryotherapy;Electrical Stimulation;Moist Heat;DME Lexicographer;Therapeutic exercise;Balance training;Neuromuscular re-education;Patient/family education;Dry needling    PT Next Visit Plan conmtinue to work on her stability and function    PT Home  Exercise Plan [id number]    Consulted and Agree with Plan of Care Patient  [name]:  Abnormal gait, Decreased balance, Decreased endurance, Decreased mobility, Difficulty walking, Impaired sensation, Improper body mechanics, Cardiopulmonary status limiting activity, Decreased activity tolerance, Decreased strength, Pain, Postural dysfunction  Visit Diagnosis: Muscle weakness (generalized)  Difficulty in walking, not elsewhere classified     Problem List Patient Active Problem List   Diagnosis Date Noted   Chronic hiccups 05/01/2021   Memory loss 05/01/2021   History of COVID-19 05/01/2021   Olfactory impairment 05/01/2021   Senile purpura ([place]) 11/15/2020   Difficulty with speech 07/23/2020   SOB (shortness of breath) 07/23/2020   Dyslipidemia 59/13/6859   Pityriasis lichenoides chronica 09/18/2019   Insomnia 08/07/2019   Anxiety 08/07/2019   Psoriatic arthritis ([place]) 08/07/2019   Gout 08/07/2019   Recurrent UTI 08/07/2019   Charcot's joint of left foot 08/07/2019   Depression 08/07/2019   Pneumonia due to COVID-19 virus 07/09/2019   Physical deconditioning 07/08/2019   COVID-19 07/07/2019   Diabetic peripheral neuropathy ([place]) 04/10/2019   Pain in left knee 10/13/2018   Ischemic colitis ([place]) 10/06/2018   Urinary incontinence 07/21/2018   Diabetic foot ulcer ([place]) 07/13/2018   DM2 (diabetes mellitus, type 2) ([place]) 05/02/2018   Psoriasis 05/02/2018   Osteoarthritis of knee 10/18/2017   Right bundle branch block 05/20/2014   OSA (obstructive sleep apnea), BiPAP 03/12/2013   Essential hypertension 03/12/2013   Obesity 09/14/2012   Hyponatremia 09/14/2012   S/P right THA, AA 09/13/2012   Deep venous thrombosis of lower extremity ([place]) 08/10/2010   Lymphocytic-plasmacytic colitis 08/11/2007   Ann Lions PT, DPT, PN2   Supplemental Physical  Therapist [facility]    Pager [phone] Acute Rehab Office [place]. [place], [place], [address] Phone: [phone]   Fax:  [phone]  Name: [name] [name] MRN: [id number] Date of Birth: 04/02/1946

## 2021-07-09 ENCOUNTER — Ambulatory Visit: Payer: Medicare Other | Admitting: Physical Therapy

## 2021-07-10 ENCOUNTER — Ambulatory Visit: Payer: Medicare Other | Attending: Nurse Practitioner | Admitting: Physical Therapy

## 2021-07-10 ENCOUNTER — Other Ambulatory Visit: Payer: Self-pay

## 2021-07-10 ENCOUNTER — Encounter: Payer: Self-pay | Admitting: Physical Therapy

## 2021-07-10 DIAGNOSIS — R262 Difficulty in walking, not elsewhere classified: Secondary | ICD-10-CM | POA: Insufficient documentation

## 2021-07-10 DIAGNOSIS — M6281 Muscle weakness (generalized): Secondary | ICD-10-CM | POA: Diagnosis not present

## 2021-07-10 DIAGNOSIS — R278 Other lack of coordination: Secondary | ICD-10-CM | POA: Insufficient documentation

## 2021-07-10 NOTE — Therapy (Signed)
[address] [place], [place], [address] Phone: [phone]   Fax:  [phone]  Physical Therapy Treatment  Patient Details  Name: [name] [name] MRN: [id number] Date of Birth: November 25, 1945 No data recorded  Encounter Date: 07/10/2021   PT End of Session - 07/10/21 1840     Visit Number 12    Date for PT Re-Evaluation 07/25/21    PT Start Time 1751    PT Stop Time 1841    PT Time Calculation (min) 50 min    Activity Tolerance Patient tolerated treatment well    Behavior During Therapy [facility] for tasks assessed/performed             Past Medical History:  Diagnosis Date   Arthritis    Closed fracture of fifth metatarsal bone 05/13/2018   Deep vein thrombosis ([place])    right calf - 05/2012    Diabetes mellitus without complication ([place])    diet controlled    GERD (gastroesophageal reflux disease)    Hyperlipidemia    Hypertension    Ejection fraction =>55% Left ventricular systolic function is normal. Left ventricular wall motion is normal     Lymphocytic colitis    Neuropathy    diabetic - in bilateral feet    Pityriasis lichenoides chronica    Sleep apnea    bipap    Past Surgical History:  Procedure Laterality Date   DILATION AND CURETTAGE OF UTERUS     EYE SURGERY  07/2020   HAMMER TOE SURGERY     right hand surgery      due to blood infection    torn meniscus repair      right knee    TOTAL HIP ARTHROPLASTY  09/13/2012   Procedure: TOTAL HIP ARTHROPLASTY ANTERIOR APPROACH;  Surgeon: Mauri Pole, MD;  Location: [facility];  Service: Orthopedics;  Laterality: Right;    There were no vitals filed for this visit.   Subjective Assessment - 07/10/21 1807     Subjective I did more balance last time and it was tough    Currently in Pain? No/denies                               [facility] Adult PT Treatment/Exercise - 07/10/21 0001       High Level Balance   High Level Balance  Activities Side stepping;Backward walking;Direction changes   with HHA   High Level Balance Comments ball toss, volley ball, SPC cone toe touches, tried her on airex that was "too much on the ankles"      Lumbar Exercises: Machines for Strengthening   Other Lumbar Machine Exercise 5# standing extension needs close CGA due to balance 2x6 reps    Other Lumbar Machine Exercise 10# row, 15# lats 2x 6 reps      Knee/Hip Exercises: Aerobic   Nustep L5 x 6 min      Knee/Hip Exercises: Machines for Strengthening   Cybex Knee Extension 5# 2x8    Cybex Knee Flexion 20# 2x10    Cybex Leg Press 20# 3x6, single legs no weight x 6 each      Knee/Hip Exercises: Standing   Walking with Sports Cord 20# fwd and backward                       PT Short Term Goals - 05/16/21 1036  PT SHORT TERM GOAL #1   Title I with basic HEP    Baseline Program modified to imporve her tolerance.    Time 3    Period Weeks    Status Not Met               PT Long Term Goals - 07/10/21 1844       PT LONG TERM GOAL #1   Title Improve motor strength in BLE to 4+/5, with good endurance to tolerate at least 2 x 20 reps of all strenht exercises.    Status Partially Met      PT LONG TERM GOAL #2   Title Improve 5x sit to stand time to 12 seconds from normal test height surface, in order to improve ability to rise from restaraunt chairs iwthout use of armrests.    Status On-going      PT LONG TERM GOAL #3   Title Patient will ambulate functionally x at least 500', on level and unlevel surfaces with LRAD, MI.    Status On-going                   Plan - 07/10/21 1841     Clinical Impression Statement Patient reports that she was scared of the balance exercises, I added more strength but some of it was balance related, then finished with balance, she did very well with the machines, With the volley ball she tended to lose balance backward, had ankle and hip reactions and had some  step reactions.  I tried the airex but this was too much for her ankles and the neuropathy.    PT Next Visit Plan see how she tolerated the addition of machines  and continue to try balance activities, the neuropathy will be a hinderance with the balance    Consulted and Agree with Plan of Care Patient             [name]:  Abnormal gait, Decreased balance, Decreased endurance, Decreased mobility, Difficulty walking, Impaired sensation, Improper body mechanics, Cardiopulmonary status limiting activity, Decreased activity tolerance, Decreased strength, Pain, Postural dysfunction  Visit Diagnosis: Muscle weakness (generalized)  Difficulty in walking, not elsewhere classified     Problem List Patient Active Problem List   Diagnosis Date Noted   Chronic hiccups 05/01/2021   Memory loss 05/01/2021   History of COVID-19 05/01/2021   Olfactory impairment 05/01/2021   Senile purpura ([place]) 11/15/2020   Difficulty with speech 07/23/2020   SOB (shortness of breath) 07/23/2020   Dyslipidemia 49/82/6415   Pityriasis lichenoides chronica 09/18/2019   Insomnia 08/07/2019   Anxiety 08/07/2019   Psoriatic arthritis ([place]) 08/07/2019   Gout 08/07/2019   Recurrent UTI 08/07/2019   Charcot's joint of left foot 08/07/2019   Depression 08/07/2019   Pneumonia due to COVID-19 virus 07/09/2019   Physical deconditioning 07/08/2019   COVID-19 07/07/2019   Diabetic peripheral neuropathy ([place]) 04/10/2019   Pain in left knee 10/13/2018   Ischemic colitis ([place]) 10/06/2018   Urinary incontinence 07/21/2018   Diabetic foot ulcer ([place]) 07/13/2018   DM2 (diabetes mellitus, type 2) ([place]) 05/02/2018   Psoriasis 05/02/2018   Osteoarthritis of knee 10/18/2017   Right bundle branch block 05/20/2014   OSA (obstructive sleep apnea), BiPAP 03/12/2013   Essential hypertension 03/12/2013   Obesity  09/14/2012   Hyponatremia 09/14/2012   S/P right THA, AA 09/13/2012   Deep venous thrombosis of  lower extremity ([place]) 08/10/2010   Lymphocytic-plasmacytic colitis 08/11/2007    Sumner Boast, PT 07/10/2021, 6:44 PM  [address] [place], [place], [address] Phone: [phone]   Fax:  [phone]  Name: [name] [name] MRN: [id number] Date of Birth: [birth year]

## 2021-07-14 ENCOUNTER — Ambulatory Visit: Payer: Medicare Other | Admitting: Physical Therapy

## 2021-07-14 ENCOUNTER — Encounter: Payer: Self-pay | Admitting: Physical Therapy

## 2021-07-14 ENCOUNTER — Ambulatory Visit: Payer: Medicare Other | Admitting: Speech Pathology

## 2021-07-14 ENCOUNTER — Other Ambulatory Visit: Payer: Self-pay

## 2021-07-14 DIAGNOSIS — M6281 Muscle weakness (generalized): Secondary | ICD-10-CM

## 2021-07-14 DIAGNOSIS — R262 Difficulty in walking, not elsewhere classified: Secondary | ICD-10-CM

## 2021-07-14 DIAGNOSIS — R278 Other lack of coordination: Secondary | ICD-10-CM | POA: Diagnosis not present

## 2021-07-14 NOTE — Therapy (Signed)
[address] [place], [place], [address] Phone: [phone]   Fax:  [phone]  Physical Therapy Treatment  Patient Details  Name: [name] [name] MRN: [id number] Date of Birth: 1945-09-14 No data recorded  Encounter Date: 07/14/2021   PT End of Session - 07/14/21 1453     Visit Number 13    Date for PT Re-Evaluation 07/25/21    PT Start Time 1405    PT Stop Time 1446    PT Time Calculation (min) 41 min    Activity Tolerance Patient tolerated treatment well    Behavior During Therapy [facility] for tasks assessed/performed             Past Medical History:  Diagnosis Date   Arthritis    Closed fracture of fifth metatarsal bone 05/13/2018   Deep vein thrombosis ([place])    right calf - 05/2012    Diabetes mellitus without complication (H[facility])    diet controlled    GERD (gastroesophageal reflux disease)    Hyperlipidemia    Hypertension    Ejection fraction =>55% Left ventricular systolic function is normal. Left ventricular wall motion is normal     Lymphocytic colitis    Neuropathy    diabetic - in bilateral feet    Pityriasis lichenoides chronica    Sleep apnea    bipap    Past Surgical History:  Procedure Laterality Date   DILATION AND CURETTAGE OF UTERUS     EYE SURGERY  07/2020   HAMMER TOE SURGERY     right hand surgery      due to blood infection    torn meniscus repair      right knee    TOTAL HIP ARTHROPLASTY  09/13/2012   Procedure: TOTAL HIP ARTHROPLASTY ANTERIOR APPROACH;  Surgeon: Mauri Pole, MD;  Location[facility];  Service: Orthopedics;  Laterality: Right;    There were no vitals filed for this visit.   Subjective Assessment - 07/14/21 1409     Subjective I was tired but I did okay after the last time , I was a little sore, but thought I did well    Currently in Pain? No/denies                               [facility] Adult PT Treatment/Exercise - 07/14/21 0001        Ambulation/Gait   Gait Comments HHA x200 feet working on a little faster walk x 2 , thenn 100'      Lumbar Exercises: Machines for Strengthening   Other Lumbar Machine Exercise 10# row, 15# lats 2x 8 reps      Lumbar Exercises: Supine   Other Supine Lumbar Exercises feet on ball K2C, trunk rotation, posterior activation, isometric abs      Knee/Hip Exercises: Aerobic   Nustep L5 x 6 min      Knee/Hip Exercises: Machines for Strengthening   Cybex Knee Extension 5# 2x10    Cybex Knee Flexion 20# 2x10      Knee/Hip Exercises: Standing   Hip Abduction Both;2 sets;10 reps    Abduction Limitations 2.5#    Hip Extension Both;2 sets;10 reps    Extension Limitations 2.5#                       PT Short Term Goals - 05/16/21 1036       PT  SHORT TERM GOAL #1   Title I with basic HEP    Baseline Program modified to imporve her tolerance.    Time 3    Period Weeks    Status Not Met               PT Long Term Goals - 07/10/21 1844       PT LONG TERM GOAL #1   Title Improve motor strength in BLE to 4+/5, with good endurance to tolerate at least 2 x 20 reps of all strenht exercises.    Status Partially Met      PT LONG TERM GOAL #2   Title Improve 5x sit to stand time to 12 seconds from normal test height surface, in order to improve ability to rise from restaraunt chairs iwthout use of armrests.    Status On-going      PT LONG TERM GOAL #3   Title Patient will ambulate functionally x at least 500', on level and unlevel surfaces with LRAD, MI.    Status On-going                   Plan - 07/14/21 1453     Clinical Impression Statement Patient reports that the ball exerecises on her back really help her back.  I did a little more fast walking and trying to build some endurance.  She did well some SOB.    PT Next Visit Plan see how she tolerated the addition of machines  and continue to try balance activities, the neuropathy will be a hinderance with  the balance    Consulted and Agree with Plan of Care Patient             [name]:  Abnormal gait, Decreased balance, Decreased endurance, Decreased mobility, Difficulty walking, Impaired sensation, Improper body mechanics, Cardiopulmonary status limiting activity, Decreased activity tolerance, Decreased strength, Pain, Postural dysfunction  Visit Diagnosis: Muscle weakness (generalized)  Difficulty in walking, not elsewhere classified  Other lack of coordination     Problem List Patient Active Problem List   Diagnosis Date Noted   Chronic hiccups 05/01/2021   Memory loss 05/01/2021   History of COVID-19 05/01/2021   Olfactory impairment 05/01/2021   Senile purpura ([place]) 11/15/2020   Difficulty with speech 07/23/2020   SOB (shortness of breath) 07/23/2020   Dyslipidemia 67/67/2094   Pityriasis lichenoides chronica 09/18/2019   Insomnia 08/07/2019   Anxiety 08/07/2019   Psoriatic arthritis ([place]) 08/07/2019   Gout 08/07/2019   Recurrent UTI 08/07/2019   Charcot's joint of left foot 08/07/2019   Depression 08/07/2019   Pneumonia due to COVID-19 virus 07/09/2019   Physical deconditioning 07/08/2019   COVID-19 07/07/2019   Diabetic peripheral neuropathy ([place]) 04/10/2019   Pain in left knee 10/13/2018   Ischemic colitis ([place]) 10/06/2018   Urinary incontinence 07/21/2018   Diabetic foot ulcer ([place]) 07/13/2018   DM2 (diabetes mellitus, type 2) ([place]) 05/02/2018   Psoriasis 05/02/2018   Osteoarthritis of knee 10/18/2017   Right bundle branch block 05/20/2014   OSA (obstructive sleep apnea), BiPAP 03/12/2013   Essential hypertension 03/12/2013   Obesity 09/14/2012   Hyponatremia 09/14/2012   S/P right THA, AA 09/13/2012   Deep venous thrombosis of lower extremity ([place]) 08/10/2010   Lymphocytic-plasmacytic colitis 08/11/2007    Sumner Boast,  PT 07/14/2021, 2:56 PM  [address]  [place], [place], [address] Phone: [phone]   Fax:  [phone]  Name: [name] [name] MRN: [id number] Date of Birth: 12-26-1945

## 2021-07-15 ENCOUNTER — Other Ambulatory Visit: Payer: Self-pay | Admitting: Internal Medicine

## 2021-07-15 NOTE — Progress Notes (Signed)
Chief Complaint: Microscopic colitis  HPI : 75 year old female with history of microscopic colitis, IBS, DVT, DM, GERD, HTN, HFpEF, OSA, and psoriatic arthritis presents with microscopic colitis  She previously followed with Dr. Cristina Gong with [facility] for lymphocytic colitis who she last saw in 02/2021. Her lymphocytic colitis was first diagnosed in 2008 in [place] by colon biopsies. She was treated with budesonide for 6 months but then had a relapsed after her budesonide was discontinued. She was then restarted on budesonide for 3 months and was maintained on Asacol. Her colonoscopy in 2011 had random colon biopsies without any pathologic diagnosis. Then she had another flare in 2012 for which she was put on a higher dose of Asacol, but this reportedly caused worsened symptoms. Thus her Asacol was discontinued. She was then maintained on Humira, which she is on for her psoriasis. She switched from Humira to Cimzia in 2018 for insurance purposes. She was restarted on budesonide (Uceris) starting in 02/2016. She was on MTX, but was previously being weaned off of this medication due to elevated LFTs.  She has a history of colon polyps. Her last colonoscopy was in 06/2014 with random biopsies that showed lymphocytic colitis and a small diminutive adenoma.   Per my discussion with the patient, she has been on Uceris 9 mg QD for about the last 10 years. She takes Imodium 2 mg QID for diarrhea currently. Sometimes she takes more Imodium if she is having more issues with diarrhea. She is also on Cimzia for her psoriatic arthritis. On the combination of Cimzia, Uceris, and Imodium, her diarrhea symptoms has been under good control. She has been having some issues with stomach pain and constipation recently. She is having on average having 1 BM per day, but there are times when she does not have a BM for 3 days. The pain is located in LLQ abdomen that improves after she has a BM. The pain only happens when she is  constipated. She takes Prilosec for GERD and has that fairly well under control. When she doesn't take her Prilosec, her heartburn will come back. She does occasionally still have trouble swallowing. This mainly ocurs with taking large pills. She is still able to eat and drink normally. She has been losing weight but this has been deliberate. Has fam hx of esophagus cancer in father and colon cancer in two grandfathers  Wt Readings from Last 3 Encounters:  07/16/21 218 lb (98.9 kg)  05/26/21 227 lb (103 kg)  04/24/21 227 lb 4.8 oz (103.1 kg)   Past Medical History:  Diagnosis Date   Arthritis    Closed fracture of fifth metatarsal bone 05/13/2018   Deep vein thrombosis ([place])    right calf - 05/2012    Diabetes mellitus without complication (H[facility])    diet controlled    GERD (gastroesophageal reflux disease)    Hyperlipidemia    Hypertension    Ejection fraction =>55% Left ventricular systolic function is normal. Left ventricular wall motion is normal     Lymphocytic colitis    Neuropathy    diabetic - in bilateral feet    Pityriasis lichenoides chronica    Sleep apnea    bipap     Past Surgical History:  Procedure Laterality Date   DILATION AND CURETTAGE OF UTERUS     EYE SURGERY  07/2020   HAMMER TOE SURGERY     right hand surgery      due to blood infection  torn meniscus repair      right knee    TOTAL HIP ARTHROPLASTY  09/13/2012   Procedure: TOTAL HIP ARTHROPLASTY ANTERIOR APPROACH;  Surgeon: Mauri Pole, MD;  Location: [facility];  Service: Orthopedics;  Laterality: Right;   Family History  Problem Relation Age of Onset   Hypertension Mother    Social History   Tobacco Use   Smoking status: Never   Smokeless tobacco: Never  Substance Use Topics   Alcohol use: Yes    Comment: occasional wine    Drug use: No   Current Outpatient Medications  Medication Sig Dispense Refill   Accu-Chek Softclix Lancets lancets Use as instructed to check sugars 2-3 times daily.   E11.42 100 each 12   acetaminophen (TYLENOL) 325 MG tablet Take 2 tablets (650 mg total) by mouth every 6 (six) hours as needed for mild pain or headache.     Alcohol Swabs PADS UAD to check sugars.  E11.65 100 each 12   allopurinol (ZYLOPRIM) 100 MG tablet TAKE 1 TABLET(100 MG) BY MOUTH TWICE DAILY 180 tablet 3   ALPRAZolam (XANAX) 0.25 MG tablet TAKE 1 TABLET(0.25 MG) BY MOUTH TWICE DAILY AS NEEDED FOR ANXIETY 20 tablet 0   ascorbic acid (VITAMIN C) 500 MG tablet Take 500 mg by mouth at bedtime.     azelastine (ASTELIN) 0.1 % nasal spray INSTILL 2 SPRAYS IN EACH NOSTRIL TWICE DAILY 30 mL 12   Blood Glucose Monitoring Suppl (ACCU-CHEK GUIDE) w/Device KIT AS DIRECTED 1 kit 0   Budesonide 9 MG TB24 Take 9 mg by mouth daily.      carvedilol (COREG) 6.25 MG tablet TAKE 1 TABLET(6.25 MG) BY MOUTH TWICE DAILY WITH A MEAL 60 tablet 3   Certolizumab Pegol (CIMZIA [place]) Inject 400 mg into the skin every 30 (thirty) days. 200 mg on each side of stomach     Cholecalciferol (VITAMIN D3) 1000 UNITS CAPS Take 1,000 Units by mouth 2 (two) times daily.      Cyanocobalamin (VITAMIN B-12) 1000 MCG SUBL Place 1,000 mcg under the tongue daily.     cyclobenzaprine (FLEXERIL) 10 MG tablet TAKE 1 TABLET BY MOUTH AT BEDTIME 90 tablet 1   DULCOLAX 100 MG capsule TAKE 1 CAPSULE(100 MG) BY MOUTH TWICE DAILY 60 capsule 2   folic acid (FOLVITE) 1 MG tablet TAKE 1 TABLET(1 MG) BY MOUTH DAILY 90 tablet 3   glucose blood (ACCU-CHEK GUIDE) test strip Use as instructed to check sugars 2-3 times daily.  E11.42 100 each 12   Insulin Pen Needle (B-D UF III MINI PEN NEEDLES) 31G X 5 MM MISC Use daily for insulin pen 90 each 3   LEVEMIR FLEXTOUCH 100 UNIT/ML FlexTouch Pen Inject 25 Units into the skin 2 (two) times daily. 50 mL 5   lisinopril-hydrochlorothiazide (ZESTORETIC) 20-25 MG tablet TAKE 1 TABLET BY MOUTH DAILY 90 tablet 1   loperamide (IMODIUM) 2 MG capsule Take 6 mg by mouth 2 (two) times daily.     Melatonin 5 MG SUBL Place  5 mg under the tongue at bedtime.     metFORMIN (GLUCOPHAGE-XR) 500 MG 24 hr tablet TAKE 1 TABLET(500 MG) BY MOUTH TWICE DAILY 180 tablet 2   methotrexate (RHEUMATREX) 2.5 MG tablet Take 20 mg by mouth every Friday.      naftifine (NAFTIN) 1 % cream Apply 1 application topically daily as needed (rash).      omeprazole (PRILOSEC) 40 MG capsule TAKE 1 CAPSULE BY MOUTH EVERY DAY 90 capsule  1   PEG-KCl-NaCl-NaSulf-Na Asc-C (PLENVU) 140 g SOLR Take 1 kit by mouth once for 1 dose. 1 each 0   potassium chloride (KLOR-CON) 10 MEQ tablet Take 3 tablets (30 mEq total) by mouth daily. 270 tablet 3   Probiotic Product (ALIGN PO) Take 1 capsule by mouth daily.     rosuvastatin (CRESTOR) 5 MG tablet TAKE 1 TABLET(5 MG) BY MOUTH DAILY 90 tablet 3   sertraline (ZOLOFT) 100 MG tablet Take 1 tablet (100 mg total) by mouth daily. 90 tablet 1   No current facility-administered medications for this visit.   Allergies  Allergen Reactions   Other Shortness Of Breath    Blue fish: palms and feet turn red   Thorazine [Chlorpromazine] Other (See Comments)    Made her more more off balanced, vision   Bee Venom Swelling   Farxiga [Dapagliflozin]     Recurrent yeast infections   Semaglutide     Rybelsus - lip swelling, GI upset   Sulfa Antibiotics Hives     Review of Systems: All systems reviewed and negative except where noted in HPI.   Physical Exam: BP 122/70   Pulse 70   Ht 5' 5" (1.651 m)   Wt 218 lb (98.9 kg)   BMI 36.28 kg/m  Constitutional: Pleasant,well-developed, female in no acute distress. HEENT: Normocephalic and atraumatic. Conjunctivae are normal. No scleral icterus. Cardiovascular: Normal rate, regular rhythm.  Pulmonary/chest: Effort normal and breath sounds normal. No wheezing, rales or rhonchi. Abdominal: Soft, nondistended, nontender. Bowel sounds active throughout. There are no masses palpable. No hepatomegaly. Extremities: No edema Neurological: Alert and oriented to person place  and time. Skin: Skin is warm and dry. No rashes noted. Psychiatric: Normal mood and affect. Behavior is normal.  Labs 11/2020: CMP with low K of 3.3, elevated glucose of 220, ALT mildly elevated at 45, albumin low at 3.2. HbA1C of 8.5%  Labs 05/2021: CBC with elevated WBC of 11.9. CRP mildly elevated at 1.2 (decreased from 1.6). ESR mildly elevated at 35.  CT A/P w/contrast 10/02/18: 1.  Descending and sigmoid colon segmental mural thickening and  pericolonic stranding indicating colitis.  Distribution of thickening  can suggest ischemic colitis although inflammatory colitis is not  excluded.  There are mild associated diverticula but no focal inflamed  diverticulum.  2.  Pancreas atrophy.  Multifocal hyperenhancing nodular density at  the tail of pancreas, 12 mm.  Small developing mass cannot be  excluded.  Recommend follow-up MRI pancreas in 6 months.  This finding  was not described in the initial [facility] preliminary report.  Ab U/S 10/03/18: Mild fatty liver. Cholelithiasis without findings of acute  cholecystitis. Extra hepatic bile ducts and pancreatic tail are  obscured.  CT A/P w/contrast 11/02/18: IMPRESSION: 1. Heterogeneous fatty replacement throughout the pancreas, without pancreatic mass or acute process. 2.  Possible constipation. 3. Hepatic steatosis and hepatomegaly. 4.  Tiny hiatal hernia. 5.  Aortic Atherosclerosis (ICD10-I70.0). 6. Degraded evaluation of the pelvis, secondary to beam hardening artifact from right hip arthroplasty. 7. Uterine fibroid.  CT A/P w/o contrast 07/12/20: IMPRESSION: 1. Diverticulosis without diverticulitis. 2. No acute intra-abdominal or intrapelvic process. 3.  Aortic Atherosclerosis (ICD10-I70.0).  Barium swallow 09/27/20: IMPRESSION: 1. Proximal anterior esophageal web, nonobstructive to passage of a 13 mm barium tablet. 2. Otherwise unremarkable double contrast barium esophagram.  ASSESSMENT AND PLAN: Microscopic  colitis History of colon polyps IBS GERD Dysphagia Esophageal web Patient presents to establish with a new GI provider. She has been on  budesonide 9 mg QD, Imodium, and Cimzia to help keep her microscopic colitis under control. She has had difficulty with reducing the dose of budesonide in the past. Currently describes occasional issues with constipation and constipation-related ab pain. Thus I have asked her to reduce her Imodium to see if this helps prevent recurrence of her constipation. Since patient is due for polyp surveillance and also describes issues with dysphagia, will plan for EGD and colonoscopy for further evaluation. I can obtain random colon biopsies at that time to confirm that her microscopic colitis is in remission. If things are still going well by the next time I see, we can decrease her budesonide from 9 mg QD to 6 mg QD. - Continue budesonide 9 mg QD - Reduce Imodium to about half of what she is currently taking (from 4 mg BID to 4 mg QD) - EGD/Colonoscopy [facility] for polyp surveillance and microscopic colitis - RTC 3 months. Will talk about going down on the budesonide to 6 mg QD at that time  [name], MD  I spent 66 minutes of time, including in depth chart review, independent review of results as outlined above, communicating results with the patient directly, face-to-face time with the patient, coordinating care, ordering studies and medications as appropriate, and documentation.

## 2021-07-16 ENCOUNTER — Ambulatory Visit: Payer: Medicare Other | Admitting: Physical Therapy

## 2021-07-16 ENCOUNTER — Ambulatory Visit (INDEPENDENT_AMBULATORY_CARE_PROVIDER_SITE_OTHER): Payer: Medicare Other | Admitting: Internal Medicine

## 2021-07-16 ENCOUNTER — Other Ambulatory Visit: Payer: Self-pay

## 2021-07-16 ENCOUNTER — Encounter: Payer: Self-pay | Admitting: Internal Medicine

## 2021-07-16 ENCOUNTER — Encounter: Payer: Self-pay | Admitting: Physical Therapy

## 2021-07-16 VITALS — BP 122/70 | HR 70 | Ht 65.0 in | Wt 218.0 lb

## 2021-07-16 DIAGNOSIS — R262 Difficulty in walking, not elsewhere classified: Secondary | ICD-10-CM | POA: Diagnosis not present

## 2021-07-16 DIAGNOSIS — K52839 Microscopic colitis, unspecified: Secondary | ICD-10-CM

## 2021-07-16 DIAGNOSIS — K219 Gastro-esophageal reflux disease without esophagitis: Secondary | ICD-10-CM | POA: Diagnosis not present

## 2021-07-16 DIAGNOSIS — M6281 Muscle weakness (generalized): Secondary | ICD-10-CM | POA: Diagnosis not present

## 2021-07-16 DIAGNOSIS — R278 Other lack of coordination: Secondary | ICD-10-CM

## 2021-07-16 DIAGNOSIS — K589 Irritable bowel syndrome without diarrhea: Secondary | ICD-10-CM | POA: Diagnosis not present

## 2021-07-16 DIAGNOSIS — Z86010 Personal history of colonic polyps: Secondary | ICD-10-CM | POA: Diagnosis not present

## 2021-07-16 DIAGNOSIS — Q394 Esophageal web: Secondary | ICD-10-CM

## 2021-07-16 DIAGNOSIS — R1310 Dysphagia, unspecified: Secondary | ICD-10-CM

## 2021-07-16 MED ORDER — PLENVU 140 G PO SOLR: 1.0000 | Freq: Once | ORAL | 0 refills | Status: AC

## 2021-07-16 NOTE — Therapy (Signed)
[place]. [place], [place], [address] Phone: [phone]   Fax:  [phone]  Physical Therapy Treatment  Patient Details  Name: [name] [name] MRN: [id number] Date of Birth: 08/31/1945 No data recorded  Encounter Date: 07/16/2021   PT End of Session - 07/16/21 1451     Visit Number 14    Date for PT Re-Evaluation 07/25/21    PT Start Time 1400    PT Stop Time 1445    PT Time Calculation (min) 45 min    Activity Tolerance Patient tolerated treatment well    Behavior During Therapy [facility] for tasks assessed/performed             Past Medical History:  Diagnosis Date   Arthritis    Closed fracture of fifth metatarsal bone 05/13/2018   Deep vein thrombosis ([place])    right calf - 05/2012    Diabetes mellitus without complication (HCC)    diet controlled    GERD (gastroesophageal reflux disease)    Hyperlipidemia    Hypertension    Ejection fraction =>55% Left ventricular systolic function is normal. Left ventricular wall motion is normal     Lymphocytic colitis    Neuropathy    diabetic - in bilateral feet    Pityriasis lichenoides chronica    Sleep apnea    bipap    Past Surgical History:  Procedure Laterality Date   DILATION AND CURETTAGE OF UTERUS     EYE SURGERY  07/2020   HAMMER TOE SURGERY     right hand surgery      due to blood infection    torn meniscus repair      right knee    TOTAL HIP ARTHROPLASTY  09/13/2012   Procedure: TOTAL HIP ARTHROPLASTY ANTERIOR APPROACH;  Surgeon: Mauri Pole, MD;  Location: [facility];  Service: Orthopedics;  Laterality: Right;    There were no vitals filed for this visit.   Subjective Assessment - 07/16/21 1407     Subjective Reports that she was tired and her shoulders were a little sore after the last visit    Currently in Pain? No/denies                               [facility] Adult PT Treatment/Exercise - 07/16/21 0001        Ambulation/Gait   Gait Comments HHA x200 feet working on a little faster walk x 2 , thenn 100'      High Level Balance   High Level Balance Activities Side stepping;Backward walking;Direction changes    High Level Balance Comments ball toss, volley ball, SPC cone toe touches, ball kicks      Lumbar Exercises: Supine   Other Supine Lumbar Exercises hooklying clams green tband, ball b/n squeeze    Other Supine Lumbar Exercises feet on ball K2C, trunk rotation, posterior activation, isometric abs 2x10      Knee/Hip Exercises: Aerobic   Nustep L5 x 7 min      Knee/Hip Exercises: Machines for Strengthening   Cybex Knee Extension 5# 2x10    Cybex Knee Flexion 20# 2x10                       PT Short Term Goals - 05/16/21 1036       PT SHORT TERM GOAL #1   Title I with basic HEP  Baseline Program modified to imporve her tolerance.    Time 3    Period Weeks    Status Not Met               PT Long Term Goals - 07/16/21 1453       PT LONG TERM GOAL #1   Title Improve motor strength in BLE to 4+/5, with good endurance to tolerate at least 2 x 20 reps of all strenht exercises.    Status Partially Met      PT LONG TERM GOAL #2   Title Improve 5x sit to stand time to 12 seconds from normal test height surface, in order to improve ability to rise from restaraunt chairs iwthout use of armrests.                   Plan - 07/16/21 1452     Clinical Impression Statement Patient a little sore in the arms and a little tired in the legs, she really works hard and is open to try many things, tried some increased balance and some increased supine exercises.    PT Next Visit Plan see how she tolerated the addition of machines  and continue to try balance activities, the neuropathy will be a hinderance with the balance    Consulted and Agree with Plan of Care Patient             Patient will benefit from skilled therapeutic intervention in order to improve the  following deficits and impairments:  Abnormal gait, Decreased balance, Decreased endurance, Decreased mobility, Difficulty walking, Impaired sensation, Improper body mechanics, Cardiopulmonary status limiting activity, Decreased activity tolerance, Decreased strength, Pain, Postural dysfunction  Visit Diagnosis: Muscle weakness (generalized)  Difficulty in walking, not elsewhere classified  Other lack of coordination     Problem List Patient Active Problem List   Diagnosis Date Noted   Chronic hiccups 05/01/2021   Memory loss 05/01/2021   History of COVID-19 05/01/2021   Olfactory impairment 05/01/2021   Senile purpura ([place]) 11/15/2020   Difficulty with speech 07/23/2020   SOB (shortness of breath) 07/23/2020   Dyslipidemia 72/89/7915   Pityriasis lichenoides chronica 09/18/2019   Insomnia 08/07/2019   Anxiety 08/07/2019   Psoriatic arthritis ([place]) 08/07/2019   Gout 08/07/2019   Recurrent UTI 08/07/2019   Charcot's joint of left foot 08/07/2019   Depression 08/07/2019   Pneumonia due to COVID-19 virus 07/09/2019   Physical deconditioning 07/08/2019   COVID-19 07/07/2019   Diabetic peripheral neuropathy ([place]) 04/10/2019   Pain in left knee 10/13/2018   Ischemic colitis ([place]) 10/06/2018   Urinary incontinence 07/21/2018   Diabetic foot ulcer ([place]) 07/13/2018   DM2 (diabetes mellitus, type 2) ([place]) 05/02/2018   Psoriasis 05/02/2018   Osteoarthritis of knee 10/18/2017   Right bundle branch block 05/20/2014   OSA (obstructive sleep apnea), BiPAP 03/12/2013   Essential hypertension 03/12/2013   Obesity 09/14/2012   Hyponatremia 09/14/2012   S/P right THA, AA 09/13/2012   Deep venous thrombosis of lower extremity ([place]) 08/10/2010   Lymphocytic-plasmacytic colitis 08/11/2007    Sumner Boast, PT 07/16/2021, 3:47 PM  [place]. [place], [place], [address] Phone: [phone]   Fax:   [phone]  Name: [name] [name] MRN: [id number] Date of Birth: May 06, 1946

## 2021-07-16 NOTE — Patient Instructions (Signed)
You have been scheduled for an endoscopy and colonoscopy. Please follow the written instructions given to you at your visit today. Please pick up your prep supplies at the pharmacy within the next 1-3 days. If you use inhalers (even only as needed), please bring them with you on the day of your procedure.  Please take 1/2 of your recommended dose of Imodium.   Continue budesonide as prescribed.   Please follow up with Dr. Lorenso Courier in 3 months.   The [place] [facility] providers would like to encourage you to use [facility] to communicate with providers for non-urgent requests or questions.  Due to long hold times on the telephone, sending your provider a message by [facility] may be a faster and more efficient way to get a response.  Please allow 48 business hours for a response.  Please remember that this is for non-urgent requests.   Due to recent changes in healthcare laws, you may see the results of your imaging and laboratory studies on MyChart before your provider has had a chance to review them.  We understand that in some cases there may be results that are confusing or concerning to you. Not all laboratory results come back in the same time frame and the provider may be waiting for multiple results in order to interpret others.  Please give [place] 48 hours in order for your provider to thoroughly review all the results before contacting the office for clarification of your results.

## 2021-07-18 ENCOUNTER — Encounter: Payer: Medicare Other | Admitting: Internal Medicine

## 2021-07-21 ENCOUNTER — Ambulatory Visit: Payer: Medicare Other | Admitting: Neurology

## 2021-07-21 ENCOUNTER — Encounter: Payer: Self-pay | Admitting: Internal Medicine

## 2021-07-22 ENCOUNTER — Ambulatory Visit: Payer: Medicare Other | Admitting: Physical Therapy

## 2021-07-23 ENCOUNTER — Encounter: Payer: Self-pay | Admitting: Physical Therapy

## 2021-07-23 ENCOUNTER — Ambulatory Visit: Payer: Medicare Other | Admitting: Physical Therapy

## 2021-07-23 ENCOUNTER — Other Ambulatory Visit: Payer: Self-pay

## 2021-07-23 DIAGNOSIS — M6281 Muscle weakness (generalized): Secondary | ICD-10-CM

## 2021-07-23 DIAGNOSIS — R278 Other lack of coordination: Secondary | ICD-10-CM

## 2021-07-23 DIAGNOSIS — R262 Difficulty in walking, not elsewhere classified: Secondary | ICD-10-CM

## 2021-07-23 NOTE — Therapy (Addendum)
[place]. [place], [place], [address] Phone: [phone]   Fax:  [phone]  Physical Therapy Treatment  Patient Details  Name: [name] [name] MRN: [id number] Date of Birth: [birth year] Referring Provider (PT): Lazaro Arms   Encounter Date: 07/23/2021  Progress Note Reporting Period 06/25/21 to 07/23/21  See note below for Objective Data and Assessment of Progress/Goals.       PT End of Session - 07/23/21 1550     Visit Number 15    Number of Visits 27    Date for PT Re-Evaluation 09/03/21    Authorization Type MCR and BCBS    Progress Note Due on Visit 25    PT Start Time 1450    PT Stop Time 1529    PT Time Calculation (min) 39 min    Activity Tolerance Patient tolerated treatment well    Behavior During Therapy WFL for tasks assessed/performed             Past Medical History:  Diagnosis Date   Arthritis    Closed fracture of fifth metatarsal bone 05/13/2018   Deep vein thrombosis ([place])    right calf - 05/2012    Diabetes mellitus without complication (HCC)    diet controlled    GERD (gastroesophageal reflux disease)    Hyperlipidemia    Hypertension    Ejection fraction =>55% Left ventricular systolic function is normal. Left ventricular wall motion is normal     Lymphocytic colitis    Neuropathy    diabetic - in bilateral feet    Pityriasis lichenoides chronica    Sleep apnea    bipap    Past Surgical History:  Procedure Laterality Date   DILATION AND CURETTAGE OF UTERUS     EYE SURGERY  07/2020   HAMMER TOE SURGERY     right hand surgery      due to blood infection    torn meniscus repair      right knee    TOTAL HIP ARTHROPLASTY  09/13/2012   Procedure: TOTAL HIP ARTHROPLASTY ANTERIOR APPROACH;  Surgeon: Mauri Pole, MD;  Location: [facility];  Service: Orthopedics;  Laterality: Right;    There were no vitals filed for this visit.   Subjective Assessment - 07/23/21 1452      Subjective I'm feeling better today, had an IBS flare up the other day but medicine helped and its better now. In terms of PT, I'm feeling good, I know I've made some improvements but I need to make more. I do feel like its really helping. Other people notice and tell me I'm walking better. My back is bothering me today, not sure why. I like using the machines in the gym, I know the muscles in my legs need work and I know those machines help my thighs.    Pertinent History Covid 2020, B PN, swallowing issues    Patient Stated Goals Walk without AD for longer distances, improve balance    Currently in Pain? Yes    Pain Score 3     Pain Location Back    Pain Orientation Lower    Pain Descriptors / Indicators Tightness    Pain Type Chronic pain                [facility] PT Assessment - 07/23/21 0001       Assessment   Medical Diagnosis debility    Referring Provider (PT) Lazaro Arms    Onset  Date/Surgical Date --   chronic   Next MD Visit PCP (Dr. Quay Burow) February 2023      Precautions   Precautions Fall      Restrictions   Weight Bearing Restrictions No      Balance Screen   Has the patient fallen in the past 6 months Yes    How many times? 1- WC not locked and rolled away    Has the patient had a decrease in activity level because of a fear of falling?  Yes    Is the patient reluctant to leave their home because of a fear of falling?  No      Home Environment   Living Environment Private residence    Living Arrangements Spouse/significant other    Available Help at Discharge Family    Type of [place] to enter    Entrance Stairs-Number of Steps 5    Entrance Stairs-Rails Can reach both    [place] to live on main level with bedroom/bathroom    Home Equipment [place] - single point;Walker - 2 wheels;Wheelchair - [employer];Shower seat;Other (comment)      Prior Function   Level of Independence Independent    Leisure , read,  travel, theater      Strength   Right Hip Flexion 3/5    Right Hip ABduction 2+/5    Left Hip Flexion 3/5    Left Hip ABduction 2/5    Right Knee Flexion 4+/5    Right Knee Extension 5/5    Left Knee Flexion 4/5    Left Knee Extension 5/5    Right Ankle Dorsiflexion 4+/5    Left Ankle Dorsiflexion 4+/5      Standardized Balance Assessment   Standardized Balance Assessment Berg Balance Test      Berg Balance Test   Sit to Stand Able to stand  independently using hands    Standing Unsupported Able to stand 2 minutes with supervision    Sitting with Back Unsupported but Feet Supported on Floor or Stool Able to sit safely and securely 2 minutes    Stand to Sit Controls descent by using hands    Transfers Able to transfer safely, definite need of hands    Standing Unsupported with Eyes Closed Able to stand 10 seconds with supervision    Standing Unsupported with Feet Together Able to place feet together independently and stand for 1 minute with supervision    From Standing, Reach Forward with Outstretched Arm Can reach forward >5 cm safely (2")    From Standing Position, Pick up Object from Floor Unable to try/needs assist to keep balance    From Standing Position, Turn to Look Behind Over each Shoulder Needs assist to keep from losing balance and falling    Turn 360 Degrees Needs close supervision or verbal cueing    Standing Unsupported, Alternately Place Feet on Step/Stool Able to complete >2 steps/needs minimal assist    Standing Unsupported, One Foot in Front Needs help to step but can hold 15 seconds    Standing on One Leg Unable to try or needs assist to prevent fall    Total Score 27                           [facility] Adult PT Treatment/Exercise - 07/23/21 0001       Transfers   Five time sit to stand comments  17  seconds, loss of eccentric control at last reps and poor balance      Lumbar Exercises: Supine   Bridge 10 reps    Other Supine Lumbar Exercises  posterior pelvic tilt 1x10 with 3 second holds    Other Supine Lumbar Exercises SKTC and lumbar rotation stretches 1x5 each side                     PT Education - 07/23/21 1550     Education Details exam findings, POC moving forward    Person(s) Educated Patient    Methods Explanation    Comprehension Verbalized understanding              PT Short Term Goals - 07/23/21 1514       PT SHORT TERM GOAL #1   Title I with basic HEP    Baseline 12/14- almost every day, have been practice walking too but weather has made it hard    Time 3    Period Weeks    Status On-going               PT Long Term Goals - 07/23/21 1515       PT LONG TERM GOAL #1   Title Improve motor strength in BLE to 4+/5, with good endurance to tolerate at least 2 x 20 reps of all strenht exercises.    Baseline 12/14- hips still very weak    Time 6    Period Weeks    Status Partially Met      PT LONG TERM GOAL #2   Title Improve 5x sit to stand time to 12 seconds from normal test height surface, in order to improve ability to rise from restaraunt chairs iwthout use of armrests.    Baseline 12/14- 17 seconds with BUEs but poor motor control and balance    Time 6    Period Weeks    Status On-going      PT LONG TERM GOAL #3   Title Patient will ambulate functionally x at least 500', on level and unlevel surfaces with LRAD, MI.    Baseline 12/14- 349f in PT    Time 6    Period Weeks    Status On-going      PT LONG TERM GOAL #4   Title Patient will tolerate sitting/ standing activities during 45 minute treatment, with maximum of 4 rest breaks, with no C/O back pain or fatigue.    Time 6    Period Weeks    Status On-going      PT LONG TERM GOAL #5   Title Up and down at least 5 steps with B rails, MI    Time 6    Period Weeks    Status On-going      Additional Long Term Goals   Additional Long Term Goals Yes      PT LONG TERM GOAL #6   Title Will improve Berg score to at  least 38 in order to show improved functional balance skills/reduced fall risk    Time 6    Period Weeks    Status New                   Plan - 07/23/21 1551     Clinical Impression Statement Ms. DWollenbergarrives today doing alright- having more back pain than typical. Did score a bit better on objective measures, I also introduced the Berg test and she was only able to score  27/56 today. Seems to be making steady progress- I think it will just take [place] a bit of time to reach her LTGs given just how deconditioned she is-, would likely benefit from skilled PT services to address functional limitations.    Personal Factors and Comorbidities Age;Behavior Pattern;Fitness;Time since onset of injury/illness/exacerbation;Past/Current Experience;Comorbidity 1;Comorbidity 2    Comorbidities Covid, Foot wound    Examination-Activity Limitations Sit;Transfers;Bend;Squat;Stairs;Carry;Stand;Toileting    Examination-Participation Restrictions Cleaning;Community Activity;Interpersonal Relationship;Laundry;Other    Stability/Clinical Decision Making Evolving/Moderate complexity    Clinical Decision Making Moderate    Rehab Potential Good    PT Frequency 2x / week    PT Duration 6 weeks    PT Treatment/Interventions ADLs/Self Care Home Management;Cryotherapy;Electrical Stimulation;Moist Heat;DME Lexicographer;Therapeutic exercise;Balance training;Neuromuscular re-education;Patient/family education;Dry needling    PT Next Visit Plan see how she tolerated the addition of machines  and continue to try balance activities, the neuropathy will be a hinderance with the balance    PT Home Exercise Plan [id number]    Consulted and Agree with Plan of Care Patient             [name]:  Abnormal gait, Decreased balance, Decreased endurance, Decreased mobility, Difficulty walking, Impaired  sensation, Improper body mechanics, Cardiopulmonary status limiting activity, Decreased activity tolerance, Decreased strength, Pain, Postural dysfunction  Visit Diagnosis: Muscle weakness (generalized)  Difficulty in walking, not elsewhere classified  Other lack of coordination     Problem List Patient Active Problem List   Diagnosis Date Noted   Chronic hiccups 05/01/2021   Memory loss 05/01/2021   History of COVID-19 05/01/2021   Olfactory impairment 05/01/2021   Senile purpura ([place]) 11/15/2020   Difficulty with speech 07/23/2020   SOB (shortness of breath) 07/23/2020   Dyslipidemia 03/23/4817   Pityriasis lichenoides chronica 09/18/2019   Insomnia 08/07/2019   Anxiety 08/07/2019   Psoriatic arthritis ([place]) 08/07/2019   Gout 08/07/2019   Recurrent UTI 08/07/2019   Charcot's joint of left foot 08/07/2019   Depression 08/07/2019   Pneumonia due to COVID-19 virus 07/09/2019   Physical deconditioning 07/08/2019   COVID-19 07/07/2019   Diabetic peripheral neuropathy ([place]) 04/10/2019   Pain in left knee 10/13/2018   Ischemic colitis ([place]) 10/06/2018   Urinary incontinence 07/21/2018   Diabetic foot ulcer ([place]) 07/13/2018   DM2 (diabetes mellitus, type 2) ([place]) 05/02/2018   Psoriasis 05/02/2018   Osteoarthritis of knee 10/18/2017   Right bundle branch block 05/20/2014   OSA (obstructive sleep apnea), BiPAP 03/12/2013   Essential hypertension 03/12/2013   Obesity 09/14/2012   Hyponatremia 09/14/2012   S/P right THA, AA 09/13/2012   Deep venous thrombosis of lower extremity ([place]) 08/10/2010   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 [place], [place], [address] Phone: [phone]   Fax:  [phone]  Name: [name] [name] MRN: [id number] Date of Birth:  1946/04/08

## 2021-07-28 ENCOUNTER — Encounter: Payer: Self-pay | Admitting: Physical Therapy

## 2021-07-28 ENCOUNTER — Other Ambulatory Visit: Payer: Self-pay

## 2021-07-28 ENCOUNTER — Ambulatory Visit: Payer: Medicare Other | Admitting: Physical Therapy

## 2021-07-28 DIAGNOSIS — R262 Difficulty in walking, not elsewhere classified: Secondary | ICD-10-CM | POA: Diagnosis not present

## 2021-07-28 DIAGNOSIS — M6281 Muscle weakness (generalized): Secondary | ICD-10-CM

## 2021-07-28 DIAGNOSIS — Z1231 Encounter for screening mammogram for malignant neoplasm of breast: Secondary | ICD-10-CM | POA: Diagnosis not present

## 2021-07-28 DIAGNOSIS — R278 Other lack of coordination: Secondary | ICD-10-CM

## 2021-07-28 NOTE — Therapy (Signed)
[place]. [place], [place], [address] Phone: [phone]   Fax:  [phone]  Physical Therapy Treatment  Patient Details  Name: [name] [name] MRN: [id number] Date of Birth: 05/25/1946 Referring Provider (PT): Lazaro Arms   Encounter Date: 07/28/2021   PT End of Session - 07/28/21 1527     Visit Number 16    Number of Visits 27    Date for PT Re-Evaluation 09/03/21    Authorization Type MCR and BCBS    PT Start Time 1445    PT Stop Time 1528    PT Time Calculation (min) 43 min    Activity Tolerance Patient tolerated treatment well    Behavior During Therapy [facility] for tasks assessed/performed             Past Medical History:  Diagnosis Date   Arthritis    Closed fracture of fifth metatarsal bone 05/13/2018   Deep vein thrombosis ([place])    right calf - 05/2012    Diabetes mellitus without complication (HCC)    diet controlled    GERD (gastroesophageal reflux disease)    Hyperlipidemia    Hypertension    Ejection fraction =>55% Left ventricular systolic function is normal. Left ventricular wall motion is normal     Lymphocytic colitis    Neuropathy    diabetic - in bilateral feet    Pityriasis lichenoides chronica    Sleep apnea    bipap    Past Surgical History:  Procedure Laterality Date   DILATION AND CURETTAGE OF UTERUS     EYE SURGERY  07/2020   HAMMER TOE SURGERY     right hand surgery      due to blood infection    torn meniscus repair      right knee    TOTAL HIP ARTHROPLASTY  09/13/2012   Procedure: TOTAL HIP ARTHROPLASTY ANTERIOR APPROACH;  Surgeon: Mauri Pole, MD;  Location: [facility];  Service: Orthopedics;  Laterality: Right;    There were no vitals filed for this visit.   Subjective Assessment - 07/28/21 1450     Subjective I drove to [place] and back this weekend.  I was sore in the back and feel weaker    Currently in Pain? Yes    Pain Score 3     Pain  Location Back    Pain Orientation Lower    Pain Descriptors / Indicators Sore;Tightness    Aggravating Factors  sitting                [facility] PT Assessment - 07/28/21 0001       Standardized Balance Assessment   Standardized Balance Assessment Timed Up and Go Test      Timed Up and Go Test   Normal TUG (seconds) 19    Manual TUG (seconds) 23                           [facility] Adult PT Treatment/Exercise - 07/28/21 0001       High Level Balance   High Level Balance Activities Side stepping;Backward walking;Direction changes    High Level Balance Comments ball toss, volley ball, SPC cone toe touches, ball kicks      Knee/Hip Exercises: Aerobic   Nustep L5 x 7 min      Knee/Hip Exercises: Machines for Strengthening   Cybex Knee Extension 5# 3x10    Cybex Knee Flexion 20#  3x10                     PT Education - 07/28/21 1503     Education Details gave 2 pages for HEP, 4 way kicks and side stepping for Hom ena dwe performed in P bars, aslo feet apart eyes closed , head turns and then feet together    Person(s) Educated Patient    Methods Explanation;Demonstration;Tactile cues;Handout    Comprehension Verbalized understanding;Returned demonstration;Verbal cues required              PT Short Term Goals - 07/23/21 1514       PT SHORT TERM GOAL #1   Title I with basic HEP    Baseline 12/14- almost every day, have been practice walking too but weather has made it hard    Time 3    Period Weeks    Status On-going               PT Long Term Goals - 07/23/21 1515       PT LONG TERM GOAL #1   Title Improve motor strength in BLE to 4+/5, with good endurance to tolerate at least 2 x 20 reps of all strenht exercises.    Baseline 12/14- hips still very weak    Time 6    Period Weeks    Status Partially Met      PT LONG TERM GOAL #2   Title Improve 5x sit to stand time to 12 seconds from normal test height surface, in order to  improve ability to rise from restaraunt chairs iwthout use of armrests.    Baseline 12/14- 17 seconds with BUEs but poor motor control and balance    Time 6    Period Weeks    Status On-going      PT LONG TERM GOAL #3   Title Patient will ambulate functionally x at least 500', on level and unlevel surfaces with LRAD, MI.    Baseline 12/14- 375ft in PT    Time 6    Period Weeks    Status On-going      PT LONG TERM GOAL #4   Title Patient will tolerate sitting/ standing activities during 45 minute treatment, with maximum of 4 rest breaks, with no C/O back pain or fatigue.    Time 6    Period Weeks    Status On-going      PT LONG TERM GOAL #5   Title Up and down at least 5 steps with B rails, MI    Time 6    Period Weeks    Status On-going      Additional Long Term Goals   Additional Long Term Goals Yes      PT LONG TERM GOAL #6   Title Will improve Berg score to at least 38 in order to show improved functional balance skills/reduced fall risk    Time 6    Period Weeks    Status New                   Plan - 07/28/21 1528     Clinical Impression Statement Patient is concerned about her balance, I did the TUG and she is at a risk for falls.  I gave her two different types of handouts for balance.  Went over safety with this and she reported understanding.  The neuropathy is a huge factor in her balance.  Safety was my biggest concern with the  HEP as she has not had any falls    PT Next Visit Plan see if the HEP is okay    Consulted and Agree with Plan of Care Patient             [name]:  Abnormal gait, Decreased balance, Decreased endurance, Decreased mobility, Difficulty walking, Impaired sensation, Improper body mechanics, Cardiopulmonary status limiting activity, Decreased activity tolerance, Decreased strength, Pain, Postural dysfunction  Visit  Diagnosis: Muscle weakness (generalized)  Difficulty in walking, not elsewhere classified  Other lack of coordination     Problem List Patient Active Problem List   Diagnosis Date Noted   Chronic hiccups 05/01/2021   Memory loss 05/01/2021   History of COVID-19 05/01/2021   Olfactory impairment 05/01/2021   Senile purpura ([place]) 11/15/2020   Difficulty with speech 07/23/2020   SOB (shortness of breath) 07/23/2020   Dyslipidemia 61/53/7943   Pityriasis lichenoides chronica 09/18/2019   Insomnia 08/07/2019   Anxiety 08/07/2019   Psoriatic arthritis ([place]) 08/07/2019   Gout 08/07/2019   Recurrent UTI 08/07/2019   Charcot's joint of left foot 08/07/2019   Depression 08/07/2019   Pneumonia due to COVID-19 virus 07/09/2019   Physical deconditioning 07/08/2019   COVID-19 07/07/2019   Diabetic peripheral neuropathy ([place]) 04/10/2019   Pain in left knee 10/13/2018   Ischemic colitis ([place]) 10/06/2018   Urinary incontinence 07/21/2018   Diabetic foot ulcer ([place]) 07/13/2018   DM2 (diabetes mellitus, type 2) ([place]) 05/02/2018   Psoriasis 05/02/2018   Osteoarthritis of knee 10/18/2017   Right bundle branch block 05/20/2014   OSA (obstructive sleep apnea), BiPAP 03/12/2013   Essential hypertension 03/12/2013   Obesity 09/14/2012   Hyponatremia 09/14/2012   S/P right THA, AA 09/13/2012   Deep venous thrombosis of lower extremity ([place]) 08/10/2010   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 [place], [place], [address] Phone: [phone]   Fax:  [phone]  Name: [name] [name] MRN: [id number] Date of Birth: 04/30/1946

## 2021-07-30 ENCOUNTER — Other Ambulatory Visit: Payer: Self-pay

## 2021-07-30 ENCOUNTER — Encounter: Payer: Self-pay | Admitting: Physical Therapy

## 2021-07-30 ENCOUNTER — Ambulatory Visit: Payer: Medicare Other | Admitting: Physical Therapy

## 2021-07-30 DIAGNOSIS — R278 Other lack of coordination: Secondary | ICD-10-CM

## 2021-07-30 DIAGNOSIS — R262 Difficulty in walking, not elsewhere classified: Secondary | ICD-10-CM | POA: Diagnosis not present

## 2021-07-30 DIAGNOSIS — M6281 Muscle weakness (generalized): Secondary | ICD-10-CM | POA: Diagnosis not present

## 2021-07-30 NOTE — Therapy (Signed)
[place]. [place], [place], [address] Phone: [phone]   Fax:  [phone]  Physical Therapy Treatment  Patient Details  Name: [name] [name] MRN: [id number] Date of Birth: 03-Sep-1945 Referring Provider (PT): Lazaro Arms   Encounter Date: 07/30/2021   PT End of Session - 07/30/21 1619     Visit Number 17    Number of Visits 27    Date for PT Re-Evaluation 09/03/21    Authorization Type MCR and BCBS    Activity Tolerance Patient tolerated treatment well    Behavior During Therapy [facility] for tasks assessed/performed             Past Medical History:  Diagnosis Date   Arthritis    Closed fracture of fifth metatarsal bone 05/13/2018   Deep vein thrombosis ([place])    right calf - 05/2012    Diabetes mellitus without complication ([place])    diet controlled    GERD (gastroesophageal reflux disease)    Hyperlipidemia    Hypertension    Ejection fraction =>55% Left ventricular systolic function is normal. Left ventricular wall motion is normal     Lymphocytic colitis    Neuropathy    diabetic - in bilateral feet    Pityriasis lichenoides chronica    Sleep apnea    bipap    Past Surgical History:  Procedure Laterality Date   DILATION AND CURETTAGE OF UTERUS     EYE SURGERY  07/2020   HAMMER TOE SURGERY     right hand surgery      due to blood infection    torn meniscus repair      right knee    TOTAL HIP ARTHROPLASTY  09/13/2012   Procedure: TOTAL HIP ARTHROPLASTY ANTERIOR APPROACH;  Surgeon: Mauri Pole, MD;  Location: [facility];  Service: Orthopedics;  Laterality: Right;    There were no vitals filed for this visit.   Subjective Assessment - 07/30/21 1511     Subjective My bowels are not right today, IBS is not well today, may not be able to do as much                               [facility] Adult PT Treatment/Exercise - 07/30/21 0001       High Level Balance   High Level  Balance Activities Side stepping;Backward walking;Direction changes    High Level Balance Comments ball toss, volley ball, SPC cone toe touches, ball kicks      Lumbar Exercises: Seated   Other Seated Lumbar Exercises red tband rows and extension arms      Knee/Hip Exercises: Standing   Hip Flexion Both;2 sets;10 reps    Hip Flexion Limitations 3#    Hip ADduction Both;2 sets;10 reps    Hip ADduction Limitations 3#    Hip Extension Both;2 sets;10 reps    Extension Limitations 3#      Knee/Hip Exercises: Seated   Other Seated Knee/Hip Exercises red tband ankle PF/DF 2x12                       PT Short Term Goals - 07/23/21 1514       PT SHORT TERM GOAL #1   Title I with basic HEP    Baseline 12/14- almost every day, have been practice walking too but weather has made it hard    Time 3  Period Weeks   ° Status On-going   ° °  °  ° °  ° ° ° ° PT Long Term Goals - 07/30/21 1624   ° °  ° PT LONG TERM GOAL #1  ° Title Improve motor strength in BLE to 4+/5, with good endurance to tolerate at least 2 x 20 reps of all strenht exercises.   ° Status Partially Met   °  ° PT LONG TERM GOAL #2  ° Title Improve 5x sit to stand time to 12 seconds from normal test height surface, in order to improve ability to rise from restaraunt chairs iwthout use of armrests.   ° Status Partially Met   ° °  °  ° °  ° ° ° ° ° ° ° ° Plan - 07/30/21 1620   ° ° Clinical Impression Statement Having issues with IBS today, so we dis a little more is sitting and less stnading and movements, still tolerated well.  Wanting to get better.  Some coordination issues with the ankles, tried the feet on the airex.   ° PT Next Visit Plan continue to progress as tolerated   ° Consulted and Agree with Plan of Care Patient   ° °  °  ° °  ° ° °[name]:  Abnormal gait, Decreased balance, Decreased endurance, Decreased  mobility, Difficulty walking, Impaired sensation, Improper body mechanics, Cardiopulmonary status limiting activity, Decreased activity tolerance, Decreased strength, Pain, Postural dysfunction ° °Visit Diagnosis: °Muscle weakness (generalized) ° °Difficulty in walking, not elsewhere classified ° °Other lack of coordination ° ° ° ° °Problem List °Patient Active Problem List  ° Diagnosis Date Noted  ° Chronic hiccups 05/01/2021  ° Memory loss 05/01/2021  ° History of COVID-19 05/01/2021  ° Olfactory impairment 05/01/2021  ° Senile purpura (HCC) 11/15/2020  ° Difficulty with speech 07/23/2020  ° SOB (shortness of breath) 07/23/2020  ° Dyslipidemia 11/12/2019  ° Pityriasis lichenoides chronica 09/18/2019  ° Insomnia 08/07/2019  ° Anxiety 08/07/2019  ° Psoriatic arthritis (HCC) 08/07/2019  ° Gout 08/07/2019  ° Recurrent UTI 08/07/2019  ° Charcot's joint of left foot 08/07/2019  ° Depression 08/07/2019  ° Pneumonia due to COVID-19 virus 07/09/2019  ° Physical deconditioning 07/08/2019  ° COVID-19 07/07/2019  ° Diabetic peripheral neuropathy (HCC) 04/10/2019  ° Pain in left knee 10/13/2018  ° Ischemic colitis (HCC) 10/06/2018  ° Urinary incontinence 07/21/2018  ° Diabetic foot ulcer (HCC) 07/13/2018  ° DM2 (diabetes mellitus, type 2) (HCC) 05/02/2018  ° Psoriasis 05/02/2018  ° Osteoarthritis of knee 10/18/2017  ° Right bundle branch block 05/20/2014  ° OSA (obstructive sleep apnea), BiPAP 03/12/2013  ° Essential hypertension 03/12/2013  ° Obesity 09/14/2012  ° Hyponatremia 09/14/2012  ° S/P right THA, AA 09/13/2012  ° Deep venous thrombosis of lower extremity (HCC) 08/10/2010  ° Lymphocytic-plasmacytic colitis 08/11/2007  ° ° °, W, PT °07/30/2021, 4:24 PM ° °[facility] °[facility] °[address] °[place], [place], [address] °Phone: [phone]   Fax:  [phone] ° °Name: [name] [name] °MRN: [id number] °Date of Birth: 02/15/1946 ° ° ° °

## 2021-07-31 DIAGNOSIS — L4059 Other psoriatic arthropathy: Secondary | ICD-10-CM | POA: Diagnosis not present

## 2021-08-06 ENCOUNTER — Ambulatory Visit: Payer: Medicare Other | Admitting: Physical Therapy

## 2021-08-06 ENCOUNTER — Encounter: Payer: Self-pay | Admitting: Physical Therapy

## 2021-08-06 ENCOUNTER — Other Ambulatory Visit: Payer: Self-pay

## 2021-08-06 DIAGNOSIS — R278 Other lack of coordination: Secondary | ICD-10-CM | POA: Diagnosis not present

## 2021-08-06 DIAGNOSIS — M6281 Muscle weakness (generalized): Secondary | ICD-10-CM | POA: Diagnosis not present

## 2021-08-06 DIAGNOSIS — R262 Difficulty in walking, not elsewhere classified: Secondary | ICD-10-CM

## 2021-08-06 NOTE — Therapy (Signed)
[place]. [place], [place], [address] Phone: [phone]   Fax:  [phone]  Physical Therapy Treatment  Patient Details  Name: [name] [name] MRN: [id number] Date of Birth: 16-Jan-1946 Referring Provider (PT): Lazaro Arms   Encounter Date: 08/06/2021   PT End of Session - 08/06/21 1151     Visit Number 18    Number of Visits 27    Date for PT Re-Evaluation 09/03/21    Authorization Type MCR and BCBS    PT Start Time 1020    PT Stop Time 1100    PT Time Calculation (min) 40 min    Activity Tolerance Patient tolerated treatment well    Behavior During Therapy [facility] for tasks assessed/performed             Past Medical History:  Diagnosis Date   Arthritis    Closed fracture of fifth metatarsal bone 05/13/2018   Deep vein thrombosis ([place])    right calf - 05/2012    Diabetes mellitus without complication (HCC)    diet controlled    GERD (gastroesophageal reflux disease)    Hyperlipidemia    Hypertension    Ejection fraction =>55% Left ventricular systolic function is normal. Left ventricular wall motion is normal     Lymphocytic colitis    Neuropathy    diabetic - in bilateral feet    Pityriasis lichenoides chronica    Sleep apnea    bipap    Past Surgical History:  Procedure Laterality Date   DILATION AND CURETTAGE OF UTERUS     EYE SURGERY  07/2020   HAMMER TOE SURGERY     right hand surgery      due to blood infection    torn meniscus repair      right knee    TOTAL HIP ARTHROPLASTY  09/13/2012   Procedure: TOTAL HIP ARTHROPLASTY ANTERIOR APPROACH;  Surgeon: Mauri Pole, MD;  [facility];  Service: Orthopedics;  Laterality: Right;    There were no vitals filed for this visit.   Subjective Assessment - 08/06/21 1033     Subjective My knees were sore after the last time    Currently in Pain? Yes    Pain Score 3     Pain Location Knee    Pain Orientation Right                                [facility] Adult PT Treatment/Exercise - 08/06/21 0001       High Level Balance   High Level Balance Comments ball toss, volley ball, SPC cone toe touches, ball kicks, reaching activity, on firm airex marching with SPC      Knee/Hip Exercises: Aerobic   Nustep L5 x 7 min      Knee/Hip Exercises: Machines for Strengthening   Cybex Knee Extension 5# 3x10    Cybex Knee Flexion 25# 3x10      Knee/Hip Exercises: Standing   Hip Flexion Both;2 sets;10 reps    Hip Flexion Limitations 3#    Hip ADduction Both;2 sets;10 reps    Hip ADduction Limitations 3#    Hip Extension Both;2 sets;10 reps    Extension Limitations 3#      Knee/Hip Exercises: Seated   Other Seated Knee/Hip Exercises red tband HS curls    Other Seated Knee/Hip Exercises red tband ankle PF/DF 2x12  PT Short Term Goals - 07/23/21 1514       PT SHORT TERM GOAL #1   Title I with basic HEP    Baseline 12/14- almost every day, have been practice walking too but weather has made it hard    Time 3    Period Weeks    Status On-going               PT Long Term Goals - 08/06/21 1155       PT LONG TERM GOAL #1   Title Improve motor strength in BLE to 4+/5, with good endurance to tolerate at least 2 x 20 reps of all strenht exercises.    Status Partially Met      PT LONG TERM GOAL #2   Title Improve 5x sit to stand time to 12 seconds from normal test height surface, in order to improve ability to rise from restaraunt chairs iwthout use of armrests.    Status Partially Met                   Plan - 08/06/21 1153     Clinical Impression Statement Patient has some knee pain, she was able to do the firm airex today with marching and did not have much issue, seemed to do well with this.  She had issues previously with this.  She did want to try the machines for the knees today    PT Next Visit Plan continue to progress as tolerated     Consulted and Agree with Plan of Care Patient             [name]:  Abnormal gait, Decreased balance, Decreased endurance, Decreased mobility, Difficulty walking, Impaired sensation, Improper body mechanics, Cardiopulmonary status limiting activity, Decreased activity tolerance, Decreased strength, Pain, Postural dysfunction  Visit Diagnosis: Muscle weakness (generalized)  Difficulty in walking, not elsewhere classified  Other lack of coordination     Problem List Patient Active Problem List   Diagnosis Date Noted   Chronic hiccups 05/01/2021   Memory loss 05/01/2021   History of COVID-19 05/01/2021   Olfactory impairment 05/01/2021   Senile purpura ([place]) 11/15/2020   Difficulty with speech 07/23/2020   SOB (shortness of breath) 07/23/2020   Dyslipidemia 75/88/3254   Pityriasis lichenoides chronica 09/18/2019   Insomnia 08/07/2019   Anxiety 08/07/2019   Psoriatic arthritis ([place]) 08/07/2019   Gout 08/07/2019   Recurrent UTI 08/07/2019   Charcot's joint of left foot 08/07/2019   Depression 08/07/2019   Pneumonia due to COVID-19 virus 07/09/2019   Physical deconditioning 07/08/2019   COVID-19 07/07/2019   Diabetic peripheral neuropathy ([place]) 04/10/2019   Pain in left knee 10/13/2018   Ischemic colitis ([place]) 10/06/2018   Urinary incontinence 07/21/2018   Diabetic foot ulcer ([place]) 07/13/2018   DM2 (diabetes mellitus, type 2) ([place]) 05/02/2018   Psoriasis 05/02/2018   Osteoarthritis of knee 10/18/2017   Right bundle branch block 05/20/2014   OSA (obstructive sleep apnea), BiPAP 03/12/2013   Essential hypertension 03/12/2013   Obesity 09/14/2012   Hyponatremia 09/14/2012   S/P right THA, AA 09/13/2012   Deep venous thrombosis of lower extremity ([place]) 08/10/2010   Lymphocytic-plasmacytic colitis 08/11/2007    Sumner Boast, PT 08/06/2021, 11:56  AM  [place]. [place], [place], [address] Phone: [phone]   Fax:  [phone]  Name: [name] [name] MRN:  [id number] Date of Birth: 07-16-1946

## 2021-08-13 ENCOUNTER — Ambulatory Visit: Payer: Medicare Other | Admitting: Physical Therapy

## 2021-08-14 ENCOUNTER — Encounter: Payer: Self-pay | Admitting: Internal Medicine

## 2021-08-15 ENCOUNTER — Encounter: Payer: Self-pay | Admitting: Physical Therapy

## 2021-08-15 ENCOUNTER — Ambulatory Visit: Payer: Medicare Other | Attending: Nurse Practitioner | Admitting: Physical Therapy

## 2021-08-15 ENCOUNTER — Other Ambulatory Visit: Payer: Self-pay

## 2021-08-15 DIAGNOSIS — R278 Other lack of coordination: Secondary | ICD-10-CM | POA: Insufficient documentation

## 2021-08-15 DIAGNOSIS — R262 Difficulty in walking, not elsewhere classified: Secondary | ICD-10-CM | POA: Insufficient documentation

## 2021-08-15 DIAGNOSIS — M6281 Muscle weakness (generalized): Secondary | ICD-10-CM | POA: Insufficient documentation

## 2021-08-15 DIAGNOSIS — R41841 Cognitive communication deficit: Secondary | ICD-10-CM | POA: Diagnosis not present
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
## 2021-08-18 ENCOUNTER — Encounter: Payer: Self-pay | Admitting: Physical Therapy

## 2021-08-18 ENCOUNTER — Ambulatory Visit: Payer: Medicare Other | Admitting: Physical Therapy

## 2021-08-18 ENCOUNTER — Other Ambulatory Visit: Payer: Self-pay

## 2021-08-18 DIAGNOSIS — R41841 Cognitive communication deficit: Secondary | ICD-10-CM | POA: Diagnosis not present

## 2021-08-18 DIAGNOSIS — M6281 Muscle weakness (generalized): Secondary | ICD-10-CM | POA: Diagnosis not present

## 2021-08-18 DIAGNOSIS — R278 Other lack of coordination: Secondary | ICD-10-CM | POA: Diagnosis not present

## 2021-08-18 DIAGNOSIS — R262 Difficulty in walking, not elsewhere classified: Secondary | ICD-10-CM
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
## 2021-08-20 ENCOUNTER — Encounter: Payer: Self-pay | Admitting: Physical Therapy

## 2021-08-20 ENCOUNTER — Ambulatory Visit: Payer: Medicare Other | Admitting: Physical Therapy

## 2021-08-20 ENCOUNTER — Other Ambulatory Visit: Payer: Self-pay

## 2021-08-20 DIAGNOSIS — R262 Difficulty in walking, not elsewhere classified: Secondary | ICD-10-CM

## 2021-08-20 DIAGNOSIS — M6281 Muscle weakness (generalized): Secondary | ICD-10-CM | POA: Diagnosis not present

## 2021-08-20 DIAGNOSIS — R278 Other lack of coordination: Secondary | ICD-10-CM

## 2021-08-20 DIAGNOSIS — R41841 Cognitive communication deficit: Secondary | ICD-10-CM | POA: Diagnosis not present
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
## 2021-08-21 ENCOUNTER — Telehealth: Payer: Self-pay | Admitting: Internal Medicine

## 2021-08-21 NOTE — Telephone Encounter (Signed)
Left message for patient to call me back at [phone] to schedule Medicare Annual Wellness Visit   Last AWV  05/03/20  Please schedule at anytime with [facility] if patient calls the office back.    40 Minutes appointment   Any questions, please call me at [phone]

## 2021-08-26 ENCOUNTER — Ambulatory Visit: Payer: Medicare Other

## 2021-08-26 ENCOUNTER — Other Ambulatory Visit: Payer: Self-pay

## 2021-08-27 ENCOUNTER — Encounter: Payer: Self-pay | Admitting: Physical Therapy

## 2021-08-27 ENCOUNTER — Ambulatory Visit: Payer: Medicare Other | Admitting: Physical Therapy

## 2021-08-27 DIAGNOSIS — R262 Difficulty in walking, not elsewhere classified: Secondary | ICD-10-CM

## 2021-08-27 DIAGNOSIS — M6281 Muscle weakness (generalized): Secondary | ICD-10-CM | POA: Diagnosis not present

## 2021-08-27 DIAGNOSIS — R278 Other lack of coordination: Secondary | ICD-10-CM

## 2021-08-27 DIAGNOSIS — R41841 Cognitive communication deficit: Secondary | ICD-10-CM | POA: Diagnosis not present

## 2021-08-27 NOTE — Therapy (Signed)
[place] [place], [place], [address] Phone: [phone]   Fax:  [phone]  Physical Therapy Treatment  Patient Details  Name: [name] [name] MRN: [id number] Date of Birth: 02-09-1946 Referring Provider (PT): Lazaro Arms   Encounter Date: 08/27/2021   PT End of Session - 08/27/21 1150     Visit Number 22    Number of Visits 27    Date for PT Re-Evaluation 09/03/21    Authorization Type MCR and BCBS    Progress Note Due on Visit 25    PT Start Time 1022   arrived a few minutes late   PT Stop Time 1059    PT Time Calculation (min) 37 min    Activity Tolerance Patient tolerated treatment well    Behavior During Therapy [facility] for tasks assessed/performed             Past Medical History:  Diagnosis Date   Arthritis    Closed fracture of fifth metatarsal bone 05/13/2018   Deep vein thrombosis ([place])    right calf - 05/2012    Diabetes mellitus without complication (HCC)    diet controlled    GERD (gastroesophageal reflux disease)    Hyperlipidemia    Hypertension    Ejection fraction =>55% Left ventricular systolic function is normal. Left ventricular wall motion is normal     Lymphocytic colitis    Neuropathy    diabetic - in bilateral feet    Pityriasis lichenoides chronica    Sleep apnea    bipap    Past Surgical History:  Procedure Laterality Date   DILATION AND CURETTAGE OF UTERUS     EYE SURGERY  07/2020   HAMMER TOE SURGERY     right hand surgery      due to blood infection    torn meniscus repair      right knee    TOTAL HIP ARTHROPLASTY  09/13/2012   Procedure: TOTAL HIP ARTHROPLASTY ANTERIOR APPROACH;  Surgeon: Mauri Pole, MD;  Location: [facility];  Service: Orthopedics;  Laterality: Right;    There were no vitals filed for this visit.   Subjective Assessment - 08/27/21 1024     Subjective My back is doing something strange today, every time I step it pops and hurts a little.  Kind of like a knuckle cracking feel. Mostly in lumbar spine. Haven't noticed anything making it feel worse or better, only happens when I'm walking.    Patient Stated Goals Walk without AD for longer distances, improve balance    Currently in Pain? Yes    Pain Score 2     Pain Location Back    Pain Orientation Right;Left    Pain Descriptors / Indicators Other (Comment)   "just a popping feeling when I step"   Pain Type Acute pain    Pain Onset Yesterday    Pain Frequency Intermittent    Aggravating Factors  walking    Pain Relieving Factors rest/sitting                               [facility] Adult PT Treatment/Exercise - 08/27/21 0001       Lumbar Exercises: Stretches   Double Knee to Chest Stretch Limitations 1x5 10 second holds    Lower Trunk Rotation 5 reps;10 seconds    Lower Trunk Rotation Limitations additional 2 LLD stretches for L lumbar rotation- much more  tight      Lumbar Exercises: Seated   Other Seated Lumbar Exercises QL stretch at edge of mat table 3x15 seconds up into sitting lx arch      Lumbar Exercises: Supine   Bridge 10 reps    Other Supine Lumbar Exercises lumbar flattening and arching 1x10                 Balance Exercises - 08/27/21 0001       Balance Exercises: Standing   Standing Eyes Closed Narrow base of support (BOS);Foam/compliant surface;3 reps;30 secs    Tandem Stance Eyes open;2 reps;30 secs    Retro Gait 4 reps   in // bars               PT Education - 08/27/21 1150     Education Details exericse form/purpose    Person(s) Educated Patient    Methods Explanation    Comprehension Verbalized understanding              PT Short Term Goals - 07/23/21 1514       PT SHORT TERM GOAL #1   Title I with basic HEP    Baseline 12/14- almost every day, have been practice walking too but weather has made it hard    Time 3    Period Weeks    Status On-going               PT Long Term Goals -  08/20/21 1152       PT LONG TERM GOAL #1   Title Improve motor strength in BLE to 4+/5, with good endurance to tolerate at least 2 x 20 reps of all strenht exercises.    Status Partially Met      PT LONG TERM GOAL #2   Title Improve 5x sit to stand time to 12 seconds from normal test height surface, in order to improve ability to rise from restaraunt chairs iwthout use of armrests.    Status Partially Met      PT LONG TERM GOAL #3   Title Patient will ambulate functionally x at least 500', on level and unlevel surfaces with LRAD, MI.    Status On-going                   Plan - 08/27/21 1152     Clinical Impression Statement [name] arrives today doing OK- reports popping back pain when she walks. Focused on back pain today, followed by balance training. Unable to lay prone for lx mobs but we did try standing self mobs with a towel. Balance continues to be impaired, we worked on this today too. Will continue efforts.    Personal Factors and Comorbidities Age;Behavior Pattern;Fitness;Time since onset of injury/illness/exacerbation;Past/Current Experience;Comorbidity 1;Comorbidity 2    Comorbidities Covid, Foot wound    Examination-Activity Limitations Sit;Transfers;Bend;Squat;Stairs;Carry;Stand;Toileting    Examination-Participation Restrictions Cleaning;Community Activity;Interpersonal Relationship;Laundry;Other    Stability/Clinical Decision Making Evolving/Moderate complexity    Clinical Decision Making Moderate    Rehab Potential Good    PT Frequency 2x / week    PT Duration 6 weeks    PT Treatment/Interventions ADLs/Self Care Home Management;Cryotherapy;Electrical Stimulation;Moist Heat;DME Lexicographer;Therapeutic exercise;Balance training;Neuromuscular re-education;Patient/family education;Dry needling    PT Next Visit Plan will renew at the end of the month and try to get her back to feeling better and have better mobility and balance    PT Home Exercise  Plan [id number]    Consulted and Agree with Plan of  Care Patient             [name]:  Abnormal gait, Decreased balance, Decreased endurance, Decreased mobility, Difficulty walking, Impaired sensation, Improper body mechanics, Cardiopulmonary status limiting activity, Decreased activity tolerance, Decreased strength, Pain, Postural dysfunction  Visit Diagnosis: Muscle weakness (generalized)  Difficulty in walking, not elsewhere classified  Other lack of coordination     Problem List Patient Active Problem List   Diagnosis Date Noted   Chronic hiccups 05/01/2021   Memory loss 05/01/2021   History of COVID-19 05/01/2021   Olfactory impairment 05/01/2021   Senile purpura ([place]) 11/15/2020   Difficulty with speech 07/23/2020   SOB (shortness of breath) 07/23/2020   Dyslipidemia 55/37/4827   Pityriasis lichenoides chronica 09/18/2019   Insomnia 08/07/2019   Anxiety 08/07/2019   Psoriatic arthritis ([place]) 08/07/2019   Gout 08/07/2019   Recurrent UTI 08/07/2019   Charcot's joint of left foot 08/07/2019   Depression 08/07/2019   Pneumonia due to COVID-19 virus 07/09/2019   Physical deconditioning 07/08/2019   COVID-19 07/07/2019   Diabetic peripheral neuropathy ([place]) 04/10/2019   Pain in left knee 10/13/2018   Ischemic colitis ([place]) 10/06/2018   Urinary incontinence 07/21/2018   Diabetic foot ulcer ([place]) 07/13/2018   DM2 (diabetes mellitus, type 2) ([place]) 05/02/2018   Psoriasis 05/02/2018   Osteoarthritis of knee 10/18/2017   Right bundle branch block 05/20/2014   OSA (obstructive sleep apnea), BiPAP 03/12/2013   Essential hypertension 03/12/2013   Obesity 09/14/2012   Hyponatremia 09/14/2012   S/P right THA, AA 09/13/2012   Deep venous thrombosis of lower extremity ([place]) 08/10/2010   Lymphocytic-plasmacytic colitis 08/11/2007   Ann Lions PT, DPT, PN2    Supplemental Physical Therapist [facility]    Pager [phone] Acute Rehab Office [place]. [place], [place], [address] Phone: [phone]   Fax:  [phone]  Name: [name] [name] MRN: [id number] Date of Birth: 1946-01-07

## 2021-08-28 DIAGNOSIS — L4059 Other psoriatic arthropathy: Secondary | ICD-10-CM | POA: Diagnosis not present

## 2021-08-28 DIAGNOSIS — Z111 Encounter for screening for respiratory tuberculosis: Secondary | ICD-10-CM | POA: Diagnosis not present

## 2021-08-28 DIAGNOSIS — Z79899 Other long term (current) drug therapy: Secondary | ICD-10-CM | POA: Diagnosis not present

## 2021-08-28 DIAGNOSIS — R5383 Other fatigue: Secondary | ICD-10-CM | POA: Diagnosis not present

## 2021-08-29 ENCOUNTER — Telehealth: Payer: Self-pay | Admitting: Internal Medicine

## 2021-08-29 ENCOUNTER — Ambulatory Visit: Payer: Medicare Other | Admitting: Physical Therapy

## 2021-08-29 MED ORDER — PLENVU 140 G PO SOLR
1.0000 | Freq: Once | ORAL | 0 refills | Status: AC
Start: 2021-08-29 — End: 2021-08-29

## 2021-08-29 NOTE — Telephone Encounter (Signed)
Prescription sent to patient's pharmacy.

## 2021-08-29 NOTE — Telephone Encounter (Signed)
Inbound call from patient stating prep medication is not at the pharmacy and is requesting for it to be sent to [facility] in chart please.

## 2021-09-01 ENCOUNTER — Encounter: Payer: Self-pay | Admitting: Cardiovascular Disease

## 2021-09-01 NOTE — Telephone Encounter (Signed)
Patient states prep need PA

## 2021-09-01 NOTE — Telephone Encounter (Signed)
Sample prep left at front desk for patient to pick up. Patient will come by and pick up today.

## 2021-09-03 ENCOUNTER — Ambulatory Visit: Payer: Medicare Other | Admitting: Physical Therapy

## 2021-09-03 ENCOUNTER — Encounter: Payer: Self-pay | Admitting: Physical Therapy

## 2021-09-03 ENCOUNTER — Other Ambulatory Visit: Payer: Self-pay

## 2021-09-03 DIAGNOSIS — M6281 Muscle weakness (generalized): Secondary | ICD-10-CM | POA: Diagnosis not present

## 2021-09-03 DIAGNOSIS — R262 Difficulty in walking, not elsewhere classified: Secondary | ICD-10-CM

## 2021-09-03 DIAGNOSIS — M79645 Pain in left finger(s): Secondary | ICD-10-CM | POA: Insufficient documentation

## 2021-09-03 DIAGNOSIS — R278 Other lack of coordination: Secondary | ICD-10-CM | POA: Diagnosis not present

## 2021-09-03 DIAGNOSIS — R41841 Cognitive communication deficit: Secondary | ICD-10-CM | POA: Diagnosis not present

## 2021-09-03 NOTE — Therapy (Signed)
[place]. [place], [place], [address] Phone: [phone]   Fax:  [phone]  Physical Therapy Treatment  Patient Details  Name: [name] [name] MRN: [id number] Date of Birth: 06/13/1946 Referring Provider (PT): [name] [name]   Encounter Date: 09/03/2021   PT End of Session - 09/03/21 1059     Visit Number 23    Date for PT Re-Evaluation 10/06/21    Authorization Type MCR and BCBS    PT Start Time 1015    PT Stop Time 1059    PT Time Calculation (min) 44 min    Activity Tolerance Patient tolerated treatment well    Behavior During Therapy [facility] for tasks assessed/performed             Past Medical History:  Diagnosis Date   Arthritis    Closed fracture of fifth metatarsal bone 05/13/2018   Deep vein thrombosis ([place])    right calf - 05/2012    Diabetes mellitus without complication (HCC)    diet controlled    GERD (gastroesophageal reflux disease)    Hyperlipidemia    Hypertension    Ejection fraction =>55% Left ventricular systolic function is normal. Left ventricular wall motion is normal     Lymphocytic colitis    Neuropathy    diabetic - in bilateral feet    Pityriasis lichenoides chronica    Sleep apnea    bipap    Past Surgical History:  Procedure Laterality Date   DILATION AND CURETTAGE OF UTERUS     EYE SURGERY  07/2020   HAMMER TOE SURGERY     right hand surgery      due to blood infection    torn meniscus repair      right knee    TOTAL HIP ARTHROPLASTY  09/13/2012   Procedure: TOTAL HIP ARTHROPLASTY ANTERIOR APPROACH;  Surgeon: [name] Pole, MD;  [facility];  Service: Orthopedics;  Laterality: Right;    There were no vitals filed for this visit.   Subjective Assessment - 09/03/21 1023     Subjective Patient still with some popping in the back, reports that today is the first day she has driven in 6 months. She appears to be walking faster    Currently in Pain? Yes     Pain Location Back    Pain Orientation Lower    Aggravating Factors  walking causes some popping                               [facility] Adult PT Treatment/Exercise - 09/03/21 0001       Lumbar Exercises: Supine   Bridge with [employer] 20 reps    Bridge with clamshell 20 reps    Other Supine Lumbar Exercises feet on ball K2C, trunk rotaiton, small bridges and isometric abs      Knee/Hip Exercises: Aerobic   Nustep L5 x 7 min      Knee/Hip Exercises: Machines for Strengthening   Cybex Knee Extension 5# 3x10    Cybex Knee Flexion 25# 3x10    Cybex Leg Press 20# 3x10    Other Machine 5# straight arm pulls 2x10      Knee/Hip Exercises: Seated   Other Seated Knee/Hip Exercises red tband ankle PF/DF 2x12                       PT Short Term  Goals - 07/23/21 1514       PT SHORT TERM GOAL #1   Title I with basic HEP    Baseline 12/14- almost every day, have been practice walking too but weather has made it hard    Time 3    Period Weeks    Status On-going               PT Long Term Goals - 09/03/21 1101       PT LONG TERM GOAL #1   Title Improve motor strength in BLE to 4+/5, with good endurance to tolerate at least 2 x 20 reps of all strenht exercises.    Status Partially Met      PT LONG TERM GOAL #2   Title Improve 5x sit to stand time to 12 seconds from normal test height surface, in order to improve ability to rise from restaraunt chairs iwthout use of armrests.    Status Partially Met      PT LONG TERM GOAL #3   Title Patient will ambulate functionally x at least 500', on level and unlevel surfaces with LRAD, MI.    Status On-going      PT LONG TERM GOAL #4   Title Patient will tolerate sitting/ standing activities during 45 minute treatment, with maximum of 4 rest breaks, with no C/O back pain or fatigue.    Status Partially Met      PT LONG TERM GOAL #5   Title Up and down at least 5 steps with B rails, MI    Status  Partially Met                   Plan - 09/03/21 1059     Clinical Impression Statement [name] was able to drive for the first time in about 6 months today, she reports that she is feeling better and stronger.  I added back some of the strength activities and she did well, was able to do the leg press again, the straight arm pulls are diffiuclt and so is the balance.  Overall she is tolerating activities better and reports feeling safer.    PT Next Visit Plan Continue to work on strength and mobility and safety    Consulted and Agree with Plan of Care Patient             [name]:  Abnormal gait, Decreased balance, Decreased endurance, Decreased mobility, Difficulty walking, Impaired sensation, Improper body mechanics, Cardiopulmonary status limiting activity, Decreased activity tolerance, Decreased strength, Pain, Postural dysfunction  Visit Diagnosis: Muscle weakness (generalized) - Plan: PT plan of care cert/re-cert  Difficulty in walking, not elsewhere classified - Plan: PT plan of care cert/re-cert     Problem List Patient Active Problem List   Diagnosis Date Noted   Chronic hiccups 05/01/2021   Memory loss 05/01/2021   History of COVID-19 05/01/2021   Olfactory impairment 05/01/2021   Senile purpura ([place]) 11/15/2020   Difficulty with speech 07/23/2020   SOB (shortness of breath) 07/23/2020   Dyslipidemia 51/05/2110   Pityriasis lichenoides chronica 09/18/2019   Insomnia 08/07/2019   Anxiety 08/07/2019   Psoriatic arthritis ([place]) 08/07/2019   Gout 08/07/2019   Recurrent UTI 08/07/2019   Charcot's joint of left foot 08/07/2019   Depression 08/07/2019   Pneumonia due to COVID-19 virus 07/09/2019   Physical deconditioning 07/08/2019   COVID-19 07/07/2019   Diabetic peripheral  neuropathy ([place]) 04/10/2019   Pain in left knee 10/13/2018   Ischemic  colitis ([place]) 10/06/2018   Urinary incontinence 07/21/2018   Diabetic foot ulcer ([place]) 07/13/2018   DM2 (diabetes mellitus, type 2) ([place]) 05/02/2018   Psoriasis 05/02/2018   Osteoarthritis of knee 10/18/2017   Right bundle branch block 05/20/2014   OSA (obstructive sleep apnea), BiPAP 03/12/2013   Essential hypertension 03/12/2013   Obesity 09/14/2012   Hyponatremia 09/14/2012   S/P right THA, AA 09/13/2012   Deep venous thrombosis of lower extremity ([place]) 08/10/2010   Lymphocytic-plasmacytic colitis 08/11/2007    Sumner Boast, PT 09/03/2021, 11:03 AM  [place]. [place], [place], [address] Phone: [phone]   Fax:  [phone]  Name: [name] [name] MRN: [id number] Date of Birth: [birth year]

## 2021-09-05 ENCOUNTER — Encounter: Payer: Self-pay | Admitting: Physical Therapy

## 2021-09-05 ENCOUNTER — Ambulatory Visit: Payer: Medicare Other | Admitting: Physical Therapy

## 2021-09-05 ENCOUNTER — Other Ambulatory Visit: Payer: Self-pay

## 2021-09-05 DIAGNOSIS — R41841 Cognitive communication deficit: Secondary | ICD-10-CM | POA: Diagnosis not present

## 2021-09-05 DIAGNOSIS — M6281 Muscle weakness (generalized): Secondary | ICD-10-CM | POA: Diagnosis not present

## 2021-09-05 DIAGNOSIS — R262 Difficulty in walking, not elsewhere classified: Secondary | ICD-10-CM

## 2021-09-05 DIAGNOSIS — R278 Other lack of coordination: Secondary | ICD-10-CM | POA: Diagnosis not present

## 2021-09-05 NOTE — Therapy (Signed)
[facility] [place] [address] [place], [place], [address] Phone: [phone]   Fax:  [phone]  Physical Therapy Treatment  Patient Details  Name: [name] [name] MRN: [id number] Date of Birth: January 12, 1946 Referring Provider (PT): Angus Seller   Encounter Date: 09/05/2021   PT End of Session - 09/05/21 1129     Visit Number 24    Date for PT Re-Evaluation 10/06/21    Authorization Type MCR and BCBS    PT Start Time 1015    PT Stop Time 1058    PT Time Calculation (min) 43 min    Activity Tolerance Patient tolerated treatment well    Behavior During Therapy [facility] for tasks assessed/performed             Past Medical History:  Diagnosis Date   Arthritis    Closed fracture of fifth metatarsal bone 05/13/2018   Deep vein thrombosis ([facility])    right calf - 05/2012    Diabetes mellitus without complication (HCC)    diet controlled    GERD (gastroesophageal reflux disease)    Hyperlipidemia    Hypertension    Ejection fraction =>55% Left ventricular systolic function is normal. Left ventricular wall motion is normal     Lymphocytic colitis    Neuropathy    diabetic - in bilateral feet    Pityriasis lichenoides chronica    Sleep apnea    bipap    Past Surgical History:  Procedure Laterality Date   DILATION AND CURETTAGE OF UTERUS     EYE SURGERY  07/2020   HAMMER TOE SURGERY     right hand surgery      due to blood infection    torn meniscus repair      right knee    TOTAL HIP ARTHROPLASTY  09/13/2012   Procedure: TOTAL HIP ARTHROPLASTY ANTERIOR APPROACH;  Surgeon: Shelda Pal, MD;  Location: [facility];  Service: Orthopedics;  Laterality: Right;    There were no vitals filed for this visit.   Subjective Assessment - 09/05/21 1025     Subjective I was very sore in my hips after the last time    Currently in Pain? Yes    Pain Score 2     Pain Location Hip    Pain Orientation Right;Left    Pain Descriptors /  Indicators Sore    Aggravating Factors  the exercises                               [facility] Adult PT Treatment/Exercise - 09/05/21 0001       Lumbar Exercises: Machines for Strengthening   Other Lumbar Machine Exercise 10# row, 15# lats 2x 10 reps      Lumbar Exercises: Supine   Bridge with [employer] 20 reps    Bridge with clamshell 20 reps    Other Supine Lumbar Exercises feet on ball K2C, trunk rotaiton, small bridges and isometric abs      Knee/Hip Exercises: Aerobic   Nustep L5 x 7 min      Knee/Hip Exercises: Machines for Strengthening   Cybex Knee Extension 5# 3x10    Cybex Knee Flexion 25# 3x10    Cybex Leg Press 20# 2x10, 10x without weight    Other Machine 5# straight arm pulls 2x10      Knee/Hip Exercises: Seated   Other Seated Knee/Hip Exercises red tband ankle PF/DF 2x12  PT Short Term Goals - 07/23/21 1514       PT SHORT TERM GOAL #1   Title I with basic HEP    Baseline 12/14- almost every day, have been practice walking too but weather has made it hard    Time 3    Period Weeks    Status On-going               PT Long Term Goals - 09/03/21 1101       PT LONG TERM GOAL #1   Title Improve motor strength in BLE to 4+/5, with good endurance to tolerate at least 2 x 20 reps of all strenht exercises.    Status Partially Met      PT LONG TERM GOAL #2   Title Improve 5x sit to stand time to 12 seconds from normal test height surface, in order to improve ability to rise from restaraunt chairs iwthout use of armrests.    Status Partially Met      PT LONG TERM GOAL #3   Title Patient will ambulate functionally x at least 500', on level and unlevel surfaces with LRAD, MI.    Status On-going      PT LONG TERM GOAL #4   Title Patient will tolerate sitting/ standing activities during 45 minute treatment, with maximum of 4 rest breaks, with no C/O back pain or fatigue.    Status Partially Met       PT LONG TERM GOAL #5   Title Up and down at least 5 steps with B rails, MI    Status Partially Met                   Plan - 09/05/21 1129     Clinical Impression Statement Patient had some pain in the hips after the last treatment, she reports that she is not sure why but thinks we did more, I tried to do about the same things to see if we can work the soreness out.  She tolerated well without increase of pain, did c/o some back popping with walking    PT Next Visit Plan Continue to work on strength and mobility and safety    Consulted and Agree with Plan of Care Patient             [name]:  Abnormal gait, Decreased balance, Decreased endurance, Decreased mobility, Difficulty walking, Impaired sensation, Improper body mechanics, Cardiopulmonary status limiting activity, Decreased activity tolerance, Decreased strength, Pain, Postural dysfunction  Visit Diagnosis: Muscle weakness (generalized)  Difficulty in walking, not elsewhere classified  Other lack of coordination  Cognitive communication deficit     Problem List Patient Active Problem List   Diagnosis Date Noted   Chronic hiccups 05/01/2021   Memory loss 05/01/2021   History of COVID-19 05/01/2021   Olfactory impairment 05/01/2021   Senile purpura ([place]) 11/15/2020   Difficulty with speech 07/23/2020   SOB (shortness of breath) 07/23/2020   Dyslipidemia 51/70/0174   Pityriasis lichenoides chronica 09/18/2019   Insomnia 08/07/2019   Anxiety 08/07/2019   Psoriatic arthritis ([place]) 08/07/2019   Gout 08/07/2019   Recurrent UTI 08/07/2019   Charcot's joint of left foot 08/07/2019   Depression 08/07/2019   Pneumonia due to COVID-19 virus 07/09/2019   Physical deconditioning 07/08/2019   COVID-19 07/07/2019   Diabetic peripheral neuropathy ([place]) 04/10/2019   Pain in left knee 10/13/2018   Ischemic  colitis ([place]) 10/06/2018   Urinary incontinence 07/21/2018   Diabetic foot ulcer ([place]) 07/13/2018   DM2 (diabetes mellitus, type 2) ([place]) 05/02/2018   Psoriasis 05/02/2018   Osteoarthritis of knee 10/18/2017   Right bundle branch block 05/20/2014   OSA (obstructive sleep apnea), BiPAP 03/12/2013   Essential hypertension 03/12/2013   Obesity 09/14/2012   Hyponatremia 09/14/2012   S/P right THA, AA 09/13/2012   Deep venous thrombosis of lower extremity ([place]) 08/10/2010   Lymphocytic-plasmacytic colitis 08/11/2007    Sumner Boast, PT 09/05/2021, 11:31 AM  [place]. [place], [place], [address] Phone: [phone]   Fax:  [phone]  Name: [name] [name] MRN: [id number] Date of Birth: March 03, 1946

## 2021-09-08 ENCOUNTER — Encounter: Payer: Self-pay | Admitting: Physical Therapy

## 2021-09-08 ENCOUNTER — Ambulatory Visit: Payer: Medicare Other | Admitting: Physical Therapy

## 2021-09-08 ENCOUNTER — Other Ambulatory Visit: Payer: Self-pay

## 2021-09-08 ENCOUNTER — Ambulatory Visit: Payer: Medicare Other

## 2021-09-08 DIAGNOSIS — R41841 Cognitive communication deficit: Secondary | ICD-10-CM | POA: Diagnosis not present

## 2021-09-08 DIAGNOSIS — R278 Other lack of coordination: Secondary | ICD-10-CM

## 2021-09-08 DIAGNOSIS — M6281 Muscle weakness (generalized): Secondary | ICD-10-CM | POA: Diagnosis not present

## 2021-09-08 DIAGNOSIS — R262 Difficulty in walking, not elsewhere classified: Secondary | ICD-10-CM

## 2021-09-08 NOTE — Therapy (Signed)
[name] [name]. [place], [place], [address] Phone: [phone]   Fax:  [phone]  Physical Therapy Treatment  Patient Details  Name: [name] [name] MRN: [id number] Date of Birth: 01-11-1946 Referring Provider (PT): Lazaro Arms   Encounter Date: 09/08/2021   PT End of Session - 09/08/21 1153     Visit Number 25    Number of Visits 27    Date for PT Re-Evaluation 10/06/21    Authorization Type MCR and BCBS    Progress Note Due on Visit 73    PT Start Time 1103    PT Stop Time 6962    PT Time Calculation (min) 41 min    Activity Tolerance Patient tolerated treatment well    Behavior During Therapy [name] [name] for tasks assessed/performed             Past Medical History:  Diagnosis Date   Arthritis    Closed fracture of fifth metatarsal bone 05/13/2018   Deep vein thrombosis ([place])    right calf - 05/2012    Diabetes mellitus without complication (HCC)    diet controlled    GERD (gastroesophageal reflux disease)    Hyperlipidemia    Hypertension    Ejection fraction =>55% Left ventricular systolic function is normal. Left ventricular wall motion is normal     Lymphocytic colitis    Neuropathy    diabetic - in bilateral feet    Pityriasis lichenoides chronica    Sleep apnea    bipap    Past Surgical History:  Procedure Laterality Date   DILATION AND CURETTAGE OF UTERUS     EYE SURGERY  07/2020   HAMMER TOE SURGERY     right hand surgery      due to blood infection    torn meniscus repair      right knee    TOTAL HIP ARTHROPLASTY  09/13/2012   Procedure: TOTAL HIP ARTHROPLASTY ANTERIOR APPROACH;  Surgeon: Mauri Pole, MD;  Location: [facility];  Service: Orthopedics;  Laterality: Right;    There were no vitals filed for this visit.   Subjective Assessment - 09/08/21 1107     Subjective I'm doing well today, my right knee is a bit sore from the weather but otherwise    Pertinent History Covid 2020, B  PN, swallowing issues    Patient Stated Goals Walk without AD for longer distances, improve balance    Currently in Pain? Yes    Pain Score 3     Pain Location Knee    Pain Orientation Right    Pain Descriptors / Indicators Sore;Sharp    Pain Type Acute pain                               [facility] Adult PT Treatment/Exercise - 09/08/21 0001       Lumbar Exercises: Aerobic   Nustep L6 x3 minutes BUEs/BLEs, then 4 minutes L5 BUEs/BLEs      Lumbar Exercises: Supine   Clam 15 reps   red TB   Bridge 15 reps    Bridge Limitations controlled lower down    Bridge with clamshell 10 reps    Bridge with [employer] Limitations red TB    Bridge with March 5 reps    Bridge with [employer] Limitations 2 sets red TB    Other Supine Lumbar Exercises abs: lumbar flattening 1x10 3 second holds;  TA set with march 1x10    Other Supine Lumbar Exercises sktc 5x5 second holds B, trunk rotation 5x5 B                     PT Education - 09/08/21 1152     Education Details exercise form, monitor BP and dizziness over next couple days, let us know if it continues so we can w/u what may be causing it    Person(s) Educated Patient    Methods Explanation    Comprehension Verbalized understanding              PT Short Term Goals - 07/23/21 1514       PT SHORT TERM GOAL #1   Title I with basic HEP    Baseline 12/14- almost every day, have been practice walking too but weather has made it hard    Time 3    Period Weeks    Status On-going               PT Long Term Goals - 09/03/21 1101       PT LONG TERM GOAL #1   Title Improve motor strength in BLE to 4+/5, with good endurance to tolerate at least 2 x 20 reps of all strenht exercises.    Status Partially Met      PT LONG TERM GOAL #2   Title Improve 5x sit to stand time to 12 seconds from normal test height surface, in order to improve ability to rise from restaraunt chairs iwthout use of armrests.     Status Partially Met      PT LONG TERM GOAL #3   Title Patient will ambulate functionally x at least 500', on level and unlevel surfaces with LRAD, MI.    Status On-going      PT LONG TERM GOAL #4   Title Patient will tolerate sitting/ standing activities during 45 minute treatment, with maximum of 4 rest breaks, with no C/O back pain or fatigue.    Status Partially Met      PT LONG TERM GOAL #5   Title Up and down at least 5 steps with B rails, MI    Status Partially Met                   Plan - 09/08/21 1155     Clinical Impression Statement [name] arrives today doing well, having a bit of arthritic R knee pain but doing well overall. Motivated to keep getting stronger. We advanced settings on Nustep, otherwise continued to work on progressing hip strength and back mobility/core. Did have plans to work on closed chain strength and balance, but she began to feel dizzy after back exercises which is unusual for her. BP slightly high at 136/90, spent some time monitoring her to make sure she was feeling better instead of continuing with exercises. I asked her to monitor pressures over next couple of days, if she keeps getting dizzy we may check orthostatics vs precautionary vestibular screen.    Personal Factors and Comorbidities Age;Behavior Pattern;Fitness;Time since onset of injury/illness/exacerbation;Past/Current Experience;Comorbidity 1;Comorbidity 2    Comorbidities Covid, Foot wound    Examination-Activity Limitations Sit;Transfers;Bend;Squat;Stairs;Carry;Stand;Toileting    Examination-Participation Restrictions Cleaning;Community Activity;Interpersonal Relationship;Laundry;Other    Stability/Clinical Decision Making Evolving/Moderate complexity    Clinical Decision Making Moderate    Rehab Potential Good    PT Frequency 2x / week    PT Duration 6 weeks    PT Treatment/Interventions  ADLs/Self Care Home Management;Cryotherapy;Electrical Stimulation;Moist Heat;DME  Lexicographer;Therapeutic exercise;Balance training;Neuromuscular re-education;Patient/family education;Dry needling    PT Next Visit Plan Continue to work on strength and mobility and safety. Do we need to do formal orthostatics vs vestibular check?    PT Home Exercise Plan [email or web address]    Consulted and Agree with Plan of Care Patient             [name]:  Abnormal gait, Decreased balance, Decreased endurance, Decreased mobility, Difficulty walking, Impaired sensation, Improper body mechanics, Cardiopulmonary status limiting activity, Decreased activity tolerance, Decreased strength, Pain, Postural dysfunction  Visit Diagnosis: Muscle weakness (generalized)  Difficulty in walking, not elsewhere classified  Other lack of coordination     Problem List Patient Active Problem List   Diagnosis Date Noted   Chronic hiccups 05/01/2021   Memory loss 05/01/2021   History of COVID-19 05/01/2021   Olfactory impairment 05/01/2021   Senile purpura ([place]) 11/15/2020   Difficulty with speech 07/23/2020   SOB (shortness of breath) 07/23/2020   Dyslipidemia 14/99/6924   Pityriasis lichenoides chronica 09/18/2019   Insomnia 08/07/2019   Anxiety 08/07/2019   Psoriatic arthritis ([place]) 08/07/2019   Gout 08/07/2019   Recurrent UTI 08/07/2019   Charcot's joint of left foot 08/07/2019   Depression 08/07/2019   Pneumonia due to COVID-19 virus 07/09/2019   Physical deconditioning 07/08/2019   COVID-19 07/07/2019   Diabetic peripheral neuropathy ([place]) 04/10/2019   Pain in left knee 10/13/2018   Ischemic colitis ([place]) 10/06/2018   Urinary incontinence 07/21/2018   Diabetic foot ulcer ([place]) 07/13/2018   DM2 (diabetes mellitus, type 2) ([place]) 05/02/2018   Psoriasis 05/02/2018   Osteoarthritis of knee 10/18/2017   Right bundle branch block 05/20/2014   OSA (obstructive sleep  apnea), BiPAP 03/12/2013   Essential hypertension 03/12/2013   Obesity 09/14/2012   Hyponatremia 09/14/2012   S/P right THA, AA 09/13/2012   Deep venous thrombosis of lower extremity ([place]) 08/10/2010   Lymphocytic-plasmacytic colitis 08/11/2007   Ann Lions PT, DPT, PN2   Supplemental Physical Therapist [facility]    Pager [phone] Acute Rehab Office [place]. [place], [place], [address] Phone: [phone]   Fax:  [phone]  Name: [name] [name] MRN: [id number] Date of Birth: [birth year]

## 2021-09-12 ENCOUNTER — Ambulatory Visit: Payer: Medicare Other | Attending: Nurse Practitioner | Admitting: Physical Therapy

## 2021-09-12 ENCOUNTER — Other Ambulatory Visit: Payer: Self-pay

## 2021-09-12 ENCOUNTER — Encounter: Payer: Self-pay | Admitting: Physical Therapy

## 2021-09-12 DIAGNOSIS — R262 Difficulty in walking, not elsewhere classified: Secondary | ICD-10-CM | POA: Insufficient documentation

## 2021-09-12 DIAGNOSIS — M6281 Muscle weakness (generalized): Secondary | ICD-10-CM | POA: Diagnosis not present

## 2021-09-12 DIAGNOSIS — R278 Other lack of coordination: Secondary | ICD-10-CM | POA: Insufficient documentation

## 2021-09-12 NOTE — Therapy (Signed)
[place]. [place], [place], [address] Phone: [phone]   Fax:  [phone]  Physical Therapy Treatment  Patient Details  Name: [name] [name] MRN: [id number] Date of Birth: Dec 21, 1945 Referring Provider (PT): [name] [name]   Encounter Date: 09/12/2021   PT End of Session - 09/12/21 1106     Visit Number 26    Number of Visits 27    Date for PT Re-Evaluation 10/06/21    Authorization Type MCR and BCBS    Progress Note Due on Visit 30    PT Start Time 1020    PT Stop Time 1100    PT Time Calculation (min) 40 min    Activity Tolerance Patient tolerated treatment well    Behavior During Therapy [facility] for tasks assessed/performed             Past Medical History:  Diagnosis Date   Arthritis    Closed fracture of fifth metatarsal bone 05/13/2018   Deep vein thrombosis ([place])    right calf - 05/2012    Diabetes mellitus without complication (HCC)    diet controlled    GERD (gastroesophageal reflux disease)    Hyperlipidemia    Hypertension    Ejection fraction =>55% Left ventricular systolic function is normal. Left ventricular wall motion is normal     Lymphocytic colitis    Neuropathy    diabetic - in bilateral feet    Pityriasis lichenoides chronica    Sleep apnea    bipap    Past Surgical History:  Procedure Laterality Date   DILATION AND CURETTAGE OF UTERUS     EYE SURGERY  07/2020   HAMMER TOE SURGERY     right hand surgery      due to blood infection    torn meniscus repair      right knee    TOTAL HIP ARTHROPLASTY  09/13/2012   Procedure: TOTAL HIP ARTHROPLASTY ANTERIOR APPROACH;  Surgeon: [name] Pole, MD;  Location: [facility];  Service: Orthopedics;  Laterality: Right;    There were no vitals filed for this visit.   Subjective Assessment - 09/12/21 1022     Subjective I feel better today, I haven't had any episodes of dizziness since last time; I feel like the back work and core work  we did last time was very helpful and would like to do more today    Pertinent History Covid 2020, B PN, swallowing issues    Patient Stated Goals Walk without AD for longer distances, improve balance    Currently in Pain? Yes    Pain Score 1     Pain Location Back    Pain Orientation Lower;Right;Left    Pain Descriptors / Indicators Aching    Pain Type Chronic pain    Pain Onset More than a month ago    Pain Frequency Constant                               [facility] Adult PT Treatment/Exercise - 09/12/21 0001       Lumbar Exercises: Aerobic   Nustep L6 x 3 min BLEs only, then L5 x1 min      Lumbar Exercises: Supine   Bridge 10 reps   staggered bridges 1x10 B   Bridge Limitations controlled lower    Bridge with clamshell 10 reps    Bridge with Ball Squeeze Limitations red TB  Bridge with March 5 reps    Bridge with [place] Right Limitations 1 set red TB    Other Supine Lumbar Exercises abs: lumbar flattening 1x10 3 second holds; TA set with 11/09/2022 1x10; dead bugs 1x10 with TA set    Other Supine Lumbar Exercises clamshells red TB 1x15                 Balance Exercises - 09/12/21 0001       Balance Exercises: Standing   Tandem Stance Eyes open;2 reps;30 secs                PT Education - 09/12/21 1106     Education Details we need to check orthostatics next session due to ongoing dizziness    Person(s) Educated Patient    Methods Explanation    Comprehension Verbalized understanding              PT Short Term Goals - 07/23/21 1514       PT SHORT TERM GOAL #1   Title I with basic HEP    Baseline 12/14- almost every day, have been practice walking too but weather has made it hard    Time 3    Period Weeks    Status On-going               PT Long Term Goals - 09/03/21 1101       PT LONG TERM GOAL #1   Title Improve motor strength in BLE to 4+/5, with good endurance to tolerate at least 2 x 20 reps of all strenht  exercises.    Status Partially Met      PT LONG TERM GOAL #2   Title Improve 5x sit to stand time to 12 seconds from normal test height surface, in order to improve ability to rise from restaraunt chairs iwthout use of armrests.    Status Partially Met      PT LONG TERM GOAL #3   Title Patient will ambulate functionally x at least 500', on level and unlevel surfaces with LRAD, MI.    Status On-going      PT LONG TERM GOAL #4   Title Patient will tolerate sitting/ standing activities during 45 minute treatment, with maximum of 4 rest breaks, with no C/O back pain or fatigue.    Status Partially Met      PT LONG TERM GOAL #5   Title Up and down at least 5 steps with B rails, MI    Status Partially Met                   Plan - 09/12/21 1107     Clinical Impression Statement [name] arrives feeling much better today, has not had any more episodes of dizziness since last session. Feels the lumbar and core work we did last time really helped, would like to do more today. Progressed these activities a bit, also incorporated some resistance work on [employer] as well as standing strengthening tasks today. Really doing well. Still having ongoing dizziness, it was better today but I think we definitely need to check orthostatics next session for r/o at the very least.    Personal Factors and Comorbidities Age;Behavior Pattern;Fitness;Time since onset of injury/illness/exacerbation;Past/Current Experience;Comorbidity 1;Comorbidity 2    Comorbidities Covid, Foot wound    Examination-Activity Limitations Sit;Transfers;Bend;Squat;Stairs;Carry;Stand;Toileting    Examination-Participation Restrictions Cleaning;Community Activity;Interpersonal Relationship;Laundry;Other    Stability/Clinical Decision Making Evolving/Moderate complexity    Clinical Decision Making Moderate  Rehab Potential Good    PT Frequency 2x / week    PT Duration 6 weeks    PT Treatment/Interventions ADLs/Self Care Home  Management;Cryotherapy;Electrical Stimulation;Moist Heat;DME Lexicographer;Therapeutic exercise;Balance training;Neuromuscular re-education;Patient/family education;Dry needling    PT Next Visit Plan Continue to work on strength and mobility and safety. Still dizzy, need to check orthostatics    PT Home Exercise Plan [id number]    Consulted and Agree with Plan of Care Patient             [name]:  Abnormal gait, Decreased balance, Decreased endurance, Decreased mobility, Difficulty walking, Impaired sensation, Improper body mechanics, Cardiopulmonary status limiting activity, Decreased activity tolerance, Decreased strength, Pain, Postural dysfunction  Visit Diagnosis: Difficulty in walking, not elsewhere classified  Muscle weakness (generalized)  Other lack of coordination     Problem List Patient Active Problem List   Diagnosis Date Noted   Chronic hiccups 05/01/2021   Memory loss 05/01/2021   History of COVID-19 05/01/2021   Olfactory impairment 05/01/2021   Senile purpura ([place]) 11/15/2020   Difficulty with speech 07/23/2020   SOB (shortness of breath) 07/23/2020   Dyslipidemia 16/05/9603   Pityriasis lichenoides chronica 09/18/2019   Insomnia 08/07/2019   Anxiety 08/07/2019   Psoriatic arthritis ([place]) 08/07/2019   Gout 08/07/2019   Recurrent UTI 08/07/2019   Charcot's joint of left foot 08/07/2019   Depression 08/07/2019   Pneumonia due to COVID-19 virus 07/09/2019   Physical deconditioning 07/08/2019   COVID-19 07/07/2019   Diabetic peripheral neuropathy ([place]) 04/10/2019   Pain in left knee 10/13/2018   Ischemic colitis ([place]) 10/06/2018   Urinary incontinence 07/21/2018   Diabetic foot ulcer ([place]) 07/13/2018   DM2 (diabetes mellitus, type 2) ([place]) 05/02/2018   Psoriasis 05/02/2018   Osteoarthritis of knee 10/18/2017   Right bundle branch  block 05/20/2014   OSA (obstructive sleep apnea), BiPAP 03/12/2013   Essential hypertension 03/12/2013   Obesity 09/14/2012   Hyponatremia 09/14/2012   S/P right THA, AA 09/13/2012   Deep venous thrombosis of lower extremity ([place]) 08/10/2010   Lymphocytic-plasmacytic colitis 08/11/2007   Ann Lions PT, DPT, PN2   Supplemental Physical Therapist [place]. [place], [place], [address] Phone: [phone]   Fax:  [phone]  Name: [name] [name] MRN: [id number] Date of Birth: 1946-05-05

## 2021-09-15 ENCOUNTER — Ambulatory Visit: Payer: Medicare Other | Admitting: Physical Therapy

## 2021-09-15 ENCOUNTER — Other Ambulatory Visit: Payer: Self-pay

## 2021-09-15 ENCOUNTER — Encounter: Payer: Self-pay | Admitting: Physical Therapy

## 2021-09-15 ENCOUNTER — Ambulatory Visit: Payer: Medicare Other | Admitting: Internal Medicine

## 2021-09-15 ENCOUNTER — Encounter: Payer: Self-pay | Admitting: Internal Medicine

## 2021-09-15 VITALS — BP 155/81

## 2021-09-15 DIAGNOSIS — R278 Other lack of coordination: Secondary | ICD-10-CM | POA: Diagnosis not present

## 2021-09-15 DIAGNOSIS — R262 Difficulty in walking, not elsewhere classified: Secondary | ICD-10-CM | POA: Diagnosis not present

## 2021-09-15 DIAGNOSIS — M6281 Muscle weakness (generalized): Secondary | ICD-10-CM

## 2021-09-15 NOTE — Therapy (Signed)
[place]. [place], [place], [address] Phone: [phone]   Fax:  [phone]  Physical Therapy Treatment  Patient Details  Name: [name] [name] MRN: [id number] Date of Birth: 1946/03/15 Referring Provider (PT): Lazaro Arms   Encounter Date: 09/15/2021   PT End of Session - 09/15/21 1608     Visit Number 27    Date for PT Re-Evaluation 10/06/21    Authorization Type MCR and BCBS    PT Start Time 1446-10-23    PT Stop Time 10-23-27    PT Time Calculation (min) 41 min    Activity Tolerance Patient tolerated treatment well    Behavior During Therapy [facility] for tasks assessed/performed             Past Medical History:  Diagnosis Date   Arthritis    Closed fracture of fifth metatarsal bone 05/13/2018   Deep vein thrombosis ([place])    right calf - 05/2012    Diabetes mellitus without complication (HCC)    diet controlled    GERD (gastroesophageal reflux disease)    Hyperlipidemia    Hypertension    Ejection fraction =>55% Left ventricular systolic function is normal. Left ventricular wall motion is normal     Lymphocytic colitis    Neuropathy    diabetic - in bilateral feet    Pityriasis lichenoides chronica    Sleep apnea    bipap    Past Surgical History:  Procedure Laterality Date   DILATION AND CURETTAGE OF UTERUS     EYE SURGERY  07/2020   HAMMER TOE SURGERY     right hand surgery      due to blood infection    torn meniscus repair      right knee    TOTAL HIP ARTHROPLASTY  09/13/2012   Procedure: TOTAL HIP ARTHROPLASTY ANTERIOR APPROACH;  Surgeon: Mauri Pole, MD;  Location: [facility];  Service: Orthopedics;  Laterality: Right;    Vitals:   09/15/21 1510  BP: (!) 155/81     Subjective Assessment - 09/15/21 1453     Subjective Just feeling tired and having some stomach issues.    Currently in Pain? No/denies                               [facility] Adult PT Treatment/Exercise -  09/15/21 0001       Lumbar Exercises: Aerobic   Nustep Level 6 x 3 minutes level 5 x 3 minutes      Lumbar Exercises: Supine   Bridge with [employer] 20 reps    Bridge with clamshell 20 reps;1 second    Bridge with [employer] Limitations red TB    Other Supine Lumbar Exercises abs: lumbar flattening 1x10 3 second holds; TA set with 10-23-2022 1x10; dead bugs 1x10 with TA set    Other Supine Lumbar Exercises feet on ball K2C, trunk rotation, small bridges, isometric abs      Knee/Hip Exercises: Machines for Strengthening   Cybex Knee Extension 5# 3x10    Cybex Knee Flexion 25# 3x10    Cybex Leg Press 20# 2x10, 10x without weight                       PT Short Term Goals - 07/23/21 1514       PT SHORT TERM GOAL #1   Title I with basic HEP  Baseline 12/14- almost every day, have been practice walking too but weather has made it hard    Time 3    Period Weeks    Status On-going               PT Long Term Goals - 09/03/21 1101       PT LONG TERM GOAL #1   Title Improve motor strength in BLE to 4+/5, with good endurance to tolerate at least 2 x 20 reps of all strenht exercises.    Status Partially Met      PT LONG TERM GOAL #2   Title Improve 5x sit to stand time to 12 seconds from normal test height surface, in order to improve ability to rise from restaraunt chairs iwthout use of armrests.    Status Partially Met      PT LONG TERM GOAL #3   Title Patient will ambulate functionally x at least 500', on level and unlevel surfaces with LRAD, MI.    Status On-going      PT LONG TERM GOAL #4   Title Patient will tolerate sitting/ standing activities during 45 minute treatment, with maximum of 4 rest breaks, with no C/O back pain or fatigue.    Status Partially Met      PT LONG TERM GOAL #5   Title Up and down at least 5 steps with B rails, MI    Status Partially Met                   Plan - 09/15/21 1524     Clinical Impression Statement BP  with exercise 154/81, lying down 129/74, then sitting up after lying down 99/72.  Discussed safety with this.  She feels like the core exercises help, she does struggle with the balance activities and has some difficulty on and off the leg press.    PT Next Visit Plan Continue to work on strength and mobility and safety.    Consulted and Agree with Plan of Care Patient             [name]:  Abnormal gait, Decreased balance, Decreased endurance, Decreased mobility, Difficulty walking, Impaired sensation, Improper body mechanics, Cardiopulmonary status limiting activity, Decreased activity tolerance, Decreased strength, Pain, Postural dysfunction  Visit Diagnosis: Difficulty in walking, not elsewhere classified  Muscle weakness (generalized)  Other lack of coordination     Problem List Patient Active Problem List   Diagnosis Date Noted   Chronic hiccups 05/01/2021   Memory loss 05/01/2021   History of COVID-19 05/01/2021   Olfactory impairment 05/01/2021   Senile purpura ([place]) 11/15/2020   Difficulty with speech 07/23/2020   SOB (shortness of breath) 07/23/2020   Dyslipidemia 86/75/4492   Pityriasis lichenoides chronica 09/18/2019   Insomnia 08/07/2019   Anxiety 08/07/2019   Psoriatic arthritis ([place]) 08/07/2019   Gout 08/07/2019   Recurrent UTI 08/07/2019   Charcot's joint of left foot 08/07/2019   Depression 08/07/2019   Pneumonia due to COVID-19 virus 07/09/2019   Physical deconditioning 07/08/2019   COVID-19 07/07/2019   Diabetic peripheral neuropathy ([place]) 04/10/2019   Pain in left knee 10/13/2018   Ischemic colitis ([place]) 10/06/2018   Urinary incontinence 07/21/2018   Diabetic foot ulcer ([place]) 07/13/2018   DM2 (diabetes mellitus, type 2) ([place]) 05/02/2018   Psoriasis 05/02/2018   Osteoarthritis of knee 10/18/2017   Right bundle branch block 05/20/2014  OSA (obstructive sleep apnea), BiPAP 03/12/2013   Essential hypertension 03/12/2013   Obesity 09/14/2012   Hyponatremia 09/14/2012   S/P right THA, AA 09/13/2012   Deep venous thrombosis of lower extremity ([place]) 08/10/2010   Lymphocytic-plasmacytic colitis 08/11/2007    Sumner Boast, PT 09/15/2021, 4:12 PM  [place]. [place], [place], [address] Phone: [phone]   Fax:  [phone]  Name: [name] [name] MRN: [id number] Date of Birth: 03-12-1946

## 2021-09-15 NOTE — Patient Instructions (Addendum)
    Blood work was ordered.      Medications changes include :   none     Please followup in 6 months  

## 2021-09-15 NOTE — Progress Notes (Signed)
Subjective:    Patient ID: [name] [name], female    DOB: [birth year], 76 y.o.   MRN: [id number]  This visit occurred during the SARS-CoV-2 public health emergency.  Safety protocols were in place, including screening questions prior to the visit, additional usage of staff PPE, and extensive cleaning of exam room while observing appropriate contact time as indicated for disinfecting solutions.     HPI The patient is here for follow up of their chronic medical problems, including DM with neuropathy, htn, hld, depression, anxiety, gout   She takes levemir 25 mg bid, metformin xr 500 mg bid --- sugars at home have been good - sugars have been less than 150.  The lowest in the 90's.  Usually 100-150.   She is doing physical therapy.  Her foot ulcer is healed and doing well.    She is eating a low carb diet and has lost weight.    Medications and allergies reviewed with patient and updated if appropriate.  Patient Active Problem List   Diagnosis Date Noted   Chronic hiccups 05/01/2021   Memory loss 05/01/2021   History of COVID-19 05/01/2021   Olfactory impairment 05/01/2021   Senile purpura ([place]) 11/15/2020   Difficulty with speech 07/23/2020   SOB (shortness of breath) 07/23/2020   Dyslipidemia 74/07/8785   Pityriasis lichenoides chronica 09/18/2019   Insomnia 08/07/2019   Anxiety 08/07/2019   Psoriatic arthritis ([place]) -Dr. Trudie Reed 08/07/2019   Gout 08/07/2019   Recurrent UTI 08/07/2019   Charcot's joint of left foot 08/07/2019   Depression 08/07/2019   Pneumonia due to COVID-19 virus 07/09/2019   Physical deconditioning 07/08/2019   COVID-19 07/07/2019   Diabetic peripheral neuropathy ([place]) 04/10/2019   Pain in left knee 10/13/2018   Ischemic colitis ([place]) 10/06/2018   Urinary incontinence 07/21/2018   Diabetic foot ulcer ([place]) 07/13/2018   DM2 (diabetes mellitus, type 2) ([place]) 05/02/2018   Psoriasis 05/02/2018   Osteoarthritis of knee 10/18/2017   Right bundle  branch block 05/20/2014   OSA (obstructive sleep apnea), BiPAP 03/12/2013   Essential hypertension 03/12/2013   Obesity 09/14/2012   Hyponatremia 09/14/2012   S/P right THA, AA 09/13/2012   Deep venous thrombosis of lower extremity ([place]) 08/10/2010   Lymphocytic-plasmacytic colitis 08/11/2007    Current Outpatient Medications on File Prior to Visit  Medication Sig Dispense Refill   Accu-Chek Softclix Lancets lancets Use as instructed to check sugars 2-3 times daily.  E11.42 100 each 12   acetaminophen (TYLENOL) 325 MG tablet Take 2 tablets (650 mg total) by mouth every 6 (six) hours as needed for mild pain or headache.     Alcohol Swabs PADS UAD to check sugars.  E11.65 100 each 12   allopurinol (ZYLOPRIM) 100 MG tablet TAKE 1 TABLET(100 MG) BY MOUTH TWICE DAILY 180 tablet 3   ALPRAZolam (XANAX) 0.25 MG tablet TAKE 1 TABLET(0.25 MG) BY MOUTH TWICE DAILY AS NEEDED FOR ANXIETY 20 tablet 0   ascorbic acid (VITAMIN C) 500 MG tablet Take 500 mg by mouth at bedtime.     azelastine (ASTELIN) 0.1 % nasal spray INSTILL 2 SPRAYS IN EACH NOSTRIL TWICE DAILY 30 mL 12   Blood Glucose Monitoring Suppl (ACCU-CHEK GUIDE) w/Device KIT AS DIRECTED 1 kit 0   Budesonide 9 MG TB24 Take 9 mg by mouth daily.      carvedilol (COREG) 6.25 MG tablet TAKE 1 TABLET(6.25 MG) BY MOUTH TWICE DAILY WITH A MEAL 60 tablet 3   Certolizumab Pegol (  CIMZIA [place]) Inject 400 mg into the skin every 30 (thirty) days. 200 mg on each side of stomach     Cholecalciferol (VITAMIN D3) 1000 UNITS CAPS Take 1,000 Units by mouth 2 (two) times daily.      Cyanocobalamin (VITAMIN B-12) 1000 MCG SUBL Place 1,000 mcg under the tongue daily.     cyclobenzaprine (FLEXERIL) 10 MG tablet TAKE 1 TABLET BY MOUTH AT BEDTIME 90 tablet 1   DULCOLAX 100 MG capsule TAKE 1 CAPSULE(100 MG) BY MOUTH TWICE DAILY 60 capsule 2   folic acid (FOLVITE) 1 MG tablet TAKE 1 TABLET(1 MG) BY MOUTH DAILY 90 tablet 3   glucose blood (ACCU-CHEK GUIDE) test strip Use as  instructed to check sugars 2-3 times daily.  E11.42 100 each 12   Insulin Pen Needle (B-D UF III MINI PEN NEEDLES) 31G X 5 MM MISC Use daily for insulin pen 90 each 3   LEVEMIR FLEXTOUCH 100 UNIT/ML FlexTouch Pen Inject 25 Units into the skin 2 (two) times daily. 50 mL 5   lisinopril-hydrochlorothiazide (ZESTORETIC) 20-25 MG tablet TAKE 1 TABLET BY MOUTH DAILY 90 tablet 1   loperamide (IMODIUM) 2 MG capsule Take 6 mg by mouth 2 (two) times daily.     Melatonin 5 MG SUBL Place 5 mg under the tongue at bedtime.     metFORMIN (GLUCOPHAGE-XR) 500 MG 24 hr tablet TAKE 1 TABLET(500 MG) BY MOUTH TWICE DAILY 180 tablet 2   methotrexate (RHEUMATREX) 2.5 MG tablet Take 20 mg by mouth every Friday.      naftifine (NAFTIN) 1 % cream Apply 1 application topically daily as needed (rash).      omeprazole (PRILOSEC) 40 MG capsule TAKE 1 CAPSULE BY MOUTH EVERY DAY 90 capsule 1   potassium chloride (KLOR-CON) 10 MEQ tablet Take 3 tablets (30 mEq total) by mouth daily. 270 tablet 3   Probiotic Product (ALIGN PO) Take 1 capsule by mouth daily.     rosuvastatin (CRESTOR) 5 MG tablet TAKE 1 TABLET(5 MG) BY MOUTH DAILY 90 tablet 3   sertraline (ZOLOFT) 100 MG tablet Take 1 tablet (100 mg total) by mouth daily. 90 tablet 1   aspirin 81 MG EC tablet 1 tablet     augmented betamethasone dipropionate (DIPROLENE-AF) 0.05 % cream betamethasone, augmented 0.05 % topical cream     No current facility-administered medications on file prior to visit.    Past Medical History:  Diagnosis Date   Arthritis    Closed fracture of fifth metatarsal bone 05/13/2018   Deep vein thrombosis (H[facility])    right calf - 05/2012    Diabetes mellitus without complication ([facility]CC)    diet controlled    GERD (gastroesophageal reflux disease)    Hyperlipidemia    Hypertension    Ejection fraction =>55% Left ventricular systolic function is normal. Left ventricular wall motion is normal     Lymphocytic colitis    Neuropathy    diabetic - in  bilateral feet    Pityriasis lichenoides chronica    Sleep apnea    bipap    Past Surgical History:  Procedure Laterality Date   DILATION AND CURETTAGE OF UTERUS     EYE SURGERY  07/2020   HAMMER TOE SURGERY     right hand surgery      due to blood infection    torn meniscus repair      right knee    TOTAL HIP ARTHROPLASTY  09/13/2012   Procedure: TOTAL HIP ARTHROPLASTY ANTERIOR APPROACH;  Surgeon: Mauri Pole, MD;  Location: [facility];  Service: Orthopedics;  Laterality: Right;    Social History   Socioeconomic History   Marital status: Divorced    Spouse name: Not on file   Number of children: Not on file   Years of education: Not on file   Highest education level: Not on file  Occupational History   Not on file  Tobacco Use   Smoking status: Never   Smokeless tobacco: Never  Substance and Sexual Activity   Alcohol use: Yes    Comment: occasional wine    Drug use: No   Sexual activity: Not on file  Other Topics Concern   Not on file  Social History Narrative   She is widowed. She has 2 children, 2 grandchildren. She does drink occasional alcohol . There is no tobacco . She does not have a routine exercise program   Social Determinants of Health   Financial Resource Strain: Low Risk    Difficulty of Paying Living Expenses: Not hard at all  Food Insecurity: No Food Insecurity   Worried About Charity fundraiser in the Last Year: Never true   [place] in the Last Year: Never true  Transportation Needs: No Transportation Needs   Lack of Transportation (Medical): No   Lack of Transportation (Non-Medical): No  Physical Activity: Inactive   Days of Exercise per Week: 0 days   Minutes of Exercise per Session: 0 min  Stress: No Stress Concern Present   Feeling of Stress : Not at all  Social Connections: Unknown   Frequency of Communication with Friends and Family: More than three times a week   Frequency of Social Gatherings with Friends and Family: Once a  week   Attends Religious Services: Patient refused   Marine scientist or Organizations: Patient refused   Attends Archivist Meetings: Patient refused   Marital Status: Divorced    Family History  Problem Relation Age of Onset   Hypertension Mother     Review of Systems  Constitutional:  Negative for chills and fever.  Respiratory:  Positive for shortness of breath (chronic - from covid - improved). Negative for cough and wheezing.   Cardiovascular:  Negative for chest pain, palpitations and leg swelling.  Neurological:  Negative for light-headedness and headaches.      Objective:   Vitals:   09/16/21 0834  BP: 124/64  Pulse: 67  Temp: 97.6 F (36.4 C)  SpO2: 97%   BP Readings from Last 3 Encounters:  09/16/21 124/64  09/15/21 (!) 155/81  07/16/21 122/70   Wt Readings from Last 3 Encounters:  09/16/21 206 lb 12.8 oz (93.8 kg)  07/16/21 218 lb (98.9 kg)  05/26/21 227 lb (103 kg)   Body mass index is 34.41 kg/m.   Physical Exam    Constitutional: Appears well-developed and well-nourished. No distress.  HENT:  Head: Normocephalic and atraumatic.  Neck: Neck supple. No tracheal deviation present. No thyromegaly present.  No cervical lymphadenopathy Cardiovascular: Normal rate, regular rhythm and normal heart sounds.   No murmur heard. No carotid bruit .  No edema Pulmonary/Chest: Effort normal and breath sounds normal. No respiratory distress. No has no wheezes. No rales.  Skin: Skin is warm and dry. Not diaphoretic.  Psychiatric: Normal mood and affect. Behavior is normal.      Assessment & Plan:    See Problem List for Assessment and Plan of chronic medical problems.

## 2021-09-16 ENCOUNTER — Ambulatory Visit: Payer: Medicare Other | Admitting: Internal Medicine

## 2021-09-16 ENCOUNTER — Ambulatory Visit (INDEPENDENT_AMBULATORY_CARE_PROVIDER_SITE_OTHER): Payer: Medicare Other | Admitting: Internal Medicine

## 2021-09-16 ENCOUNTER — Ambulatory Visit (INDEPENDENT_AMBULATORY_CARE_PROVIDER_SITE_OTHER): Payer: Medicare Other

## 2021-09-16 VITALS — BP 124/64 | HR 67 | Temp 97.6°F | Ht 65.0 in | Wt 206.8 lb

## 2021-09-16 DIAGNOSIS — F3289 Other specified depressive episodes: Secondary | ICD-10-CM

## 2021-09-16 DIAGNOSIS — K52832 Lymphocytic colitis: Secondary | ICD-10-CM

## 2021-09-16 DIAGNOSIS — Z0000 Encounter for general adult medical examination without abnormal findings: Secondary | ICD-10-CM

## 2021-09-16 DIAGNOSIS — F419 Anxiety disorder, unspecified: Secondary | ICD-10-CM

## 2021-09-16 DIAGNOSIS — E1142 Type 2 diabetes mellitus with diabetic polyneuropathy: Secondary | ICD-10-CM

## 2021-09-16 DIAGNOSIS — I10 Essential (primary) hypertension: Secondary | ICD-10-CM | POA: Diagnosis not present

## 2021-09-16 DIAGNOSIS — E785 Hyperlipidemia, unspecified: Secondary | ICD-10-CM | POA: Diagnosis not present

## 2021-09-16 DIAGNOSIS — M109 Gout, unspecified: Secondary | ICD-10-CM

## 2021-09-16 DIAGNOSIS — L4050 Arthropathic psoriasis, unspecified: Secondary | ICD-10-CM

## 2021-09-16 LAB — COMPREHENSIVE METABOLIC PANEL
ALT: 31 U/L (ref 0–35)
AST: 35 U/L (ref 0–37)
Albumin: 4.1 g/dL (ref 3.5–5.2)
Alkaline Phosphatase: 59 U/L (ref 39–117)
BUN: 18 mg/dL (ref 6–23)
CO2: 32 mEq/L (ref 19–32)
Calcium: 10.9 mg/dL — ABNORMAL HIGH (ref 8.4–10.5)
Chloride: 96 mEq/L (ref 96–112)
Creatinine, Ser: 0.88 mg/dL (ref 0.40–1.20)
GFR: 63.96 mL/min (ref >60.00)
Glucose, Bld: 121 mg/dL — ABNORMAL HIGH (ref 70–99)
Potassium: 4.9 mEq/L (ref 3.5–5.1)
Sodium: 134 mEq/L — ABNORMAL LOW (ref 135–145)
Total Bilirubin: 0.6 mg/dL (ref 0.2–1.2)
Total Protein: 6.9 g/dL (ref 6.0–8.3)

## 2021-09-16 LAB — HEMOGLOBIN A1C: Hgb A1c MFr Bld: 6.6 % — ABNORMAL HIGH (ref 4.6–6.5)

## 2021-09-16 LAB — LIPID PANEL
Cholesterol: 125 mg/dL (ref 0–200)
HDL: 50.8 mg/dL (ref >39.00)
LDL Cholesterol: 44 mg/dL (ref 0–99)
NonHDL: 74
Total CHOL/HDL Ratio: 2
Triglycerides: 151 mg/dL — ABNORMAL HIGH (ref 0.0–149.0)
VLDL: 30.2 mg/dL (ref 0.0–40.0)

## 2021-09-16 NOTE — Assessment & Plan Note (Signed)
Chronic Controlled, Stable Continue sertraline 100 mg daily 

## 2021-09-16 NOTE — Progress Notes (Signed)
Subjective:   [name] [name] is a 76 y.o. female who presents for Medicare Annual (Subsequent) preventive examination.  Review of Systems     Cardiac Risk Factors include: advanced age (>71men, >82 women);diabetes mellitus;dyslipidemia;family history of premature cardiovascular disease;hypertension;obesity (BMI >30kg/m2)     Objective:    Today's Vitals   09/16/21 0853 09/16/21 0948  BP: 124/64   Pulse: 67   Temp: 97.6 F (36.4 C)   SpO2: 97%   Weight: 206 lb 12.7 oz (93.8 kg)   Height: 5\' 5"  (1.651 m)   PainSc:  2    Body mass index is 34.41 kg/m.  Advanced Directives 09/16/2021 05/13/2021 05/03/2020 03/12/2020 07/08/2019 07/07/2019 05/02/2018  Does Patient Have a Medical Advance Directive? Yes Yes Yes No - No Yes  Type of Advance Directive Living will Healthcare Power of [place];Living will Healthcare Power of [place];Living will - - - Living will  Does patient want to make changes to medical advance directive? No - Patient declined - No - Patient declined - - - No - Patient declined  Copy of Healthcare Power of Attorney in Chart? - No - copy requested No - copy requested - - - -  Would patient like information on creating a medical advance directive? - - - - No - Patient declined - -  Pre-existing out of facility DNR order (yellow form or pink MOST form) - - - - - - -  Some encounter information is confidential and restricted. Go to Review Flowsheets activity to see all data.    Current Medications (verified) Outpatient Encounter Medications as of 09/16/2021  Medication Sig   Accu-Chek Softclix Lancets lancets Use as instructed to check sugars 2-3 times daily.  E11.42   acetaminophen (TYLENOL) 325 MG tablet Take 2 tablets (650 mg total) by mouth every 6 (six) hours as needed for mild pain or headache.   Alcohol Swabs PADS UAD to check sugars.  E11.65   allopurinol (ZYLOPRIM) 100 MG tablet TAKE 1 TABLET(100 MG) BY MOUTH TWICE DAILY   ALPRAZolam (XANAX) 0.25 MG tablet TAKE 1  TABLET(0.25 MG) BY MOUTH TWICE DAILY AS NEEDED FOR ANXIETY   ascorbic acid (VITAMIN C) 500 MG tablet Take 500 mg by mouth at bedtime.   azelastine (ASTELIN) 0.1 % nasal spray INSTILL 2 SPRAYS IN EACH NOSTRIL TWICE DAILY   Blood Glucose Monitoring Suppl (ACCU-CHEK GUIDE) w/Device KIT AS DIRECTED   Budesonide 9 MG TB24 Take 9 mg by mouth daily.    carvedilol (COREG) 6.25 MG tablet TAKE 1 TABLET(6.25 MG) BY MOUTH TWICE DAILY WITH A MEAL   Certolizumab Pegol (CIMZIA [place]) Inject 400 mg into the skin every 30 (thirty) days. 200 mg on each side of stomach   Cholecalciferol (VITAMIN D3) 1000 UNITS CAPS Take 1,000 Units by mouth 2 (two) times daily.    Cyanocobalamin (VITAMIN B-12) 1000 MCG SUBL Place 1,000 mcg under the tongue daily.   cyclobenzaprine (FLEXERIL) 10 MG tablet TAKE 1 TABLET BY MOUTH AT BEDTIME   DULCOLAX 100 MG capsule TAKE 1 CAPSULE(100 MG) BY MOUTH TWICE DAILY   folic acid (FOLVITE) 1 MG tablet TAKE 1 TABLET(1 MG) BY MOUTH DAILY   glucose blood (ACCU-CHEK GUIDE) test strip Use as instructed to check sugars 2-3 times daily.  E11.42   Insulin Pen Needle (B-D UF III MINI PEN NEEDLES) 31G X 5 MM MISC Use daily for insulin pen   LEVEMIR FLEXTOUCH 100 UNIT/ML FlexTouch Pen Inject 25 Units into the skin 2 (two) times daily.  lisinopril-hydrochlorothiazide (ZESTORETIC) 20-25 MG tablet TAKE 1 TABLET BY MOUTH DAILY   loperamide (IMODIUM) 2 MG capsule Take 6 mg by mouth 2 (two) times daily.   Melatonin 5 MG SUBL Place 5 mg under the tongue at bedtime.   metFORMIN (GLUCOPHAGE-XR) 500 MG 24 hr tablet TAKE 1 TABLET(500 MG) BY MOUTH TWICE DAILY   methotrexate (RHEUMATREX) 2.5 MG tablet Take 20 mg by mouth every Friday.    naftifine (NAFTIN) 1 % cream Apply 1 application topically daily as needed (rash).    omeprazole (PRILOSEC) 40 MG capsule TAKE 1 CAPSULE BY MOUTH EVERY DAY   potassium chloride (KLOR-CON) 10 MEQ tablet Take 3 tablets (30 mEq total) by mouth daily.   Probiotic Product (ALIGN PO)  Take 1 capsule by mouth daily.   rosuvastatin (CRESTOR) 5 MG tablet TAKE 1 TABLET(5 MG) BY MOUTH DAILY   sertraline (ZOLOFT) 100 MG tablet Take 1 tablet (100 mg total) by mouth daily.   No facility-administered encounter medications on file as of 09/16/2021.    Allergies (verified) Other, Thorazine [chlorpromazine], Bee venom, Farxiga [dapagliflozin], Semaglutide, and Sulfa antibiotics   History: Past Medical History:  Diagnosis Date   Arthritis    Closed fracture of fifth metatarsal bone 05/13/2018   Deep vein thrombosis ([place])    right calf - 05/2012    Diabetes mellitus without complication (HCC)    diet controlled    GERD (gastroesophageal reflux disease)    Hyperlipidemia    Hypertension    Ejection fraction =>55% Left ventricular systolic function is normal. Left ventricular wall motion is normal     Lymphocytic colitis    Neuropathy    diabetic - in bilateral feet    Pityriasis lichenoides chronica    Sleep apnea    bipap   Past Surgical History:  Procedure Laterality Date   DILATION AND CURETTAGE OF UTERUS     EYE SURGERY  07/2020   HAMMER TOE SURGERY     right hand surgery      due to blood infection    torn meniscus repair      right knee    TOTAL HIP ARTHROPLASTY  09/13/2012   Procedure: TOTAL HIP ARTHROPLASTY ANTERIOR APPROACH;  Surgeon: Mauri Pole, MD;  Location: [facility];  Service: Orthopedics;  Laterality: Right;   Family History  Problem Relation Age of Onset   Hypertension Mother    Social History   Socioeconomic History   Marital status: Divorced    Spouse name: Not on file   Number of children: Not on file   Years of education: Not on file   Highest education level: Not on file  Occupational History   Not on file  Tobacco Use   Smoking status: Never   Smokeless tobacco: Never  Substance and Sexual Activity   Alcohol use: Yes    Comment: occasional wine    Drug use: No   Sexual activity: Not on file  Other Topics Concern   Not on file   Social History Narrative   She is widowed. She has 2 children, 2 grandchildren. She does drink occasional alcohol . There is no tobacco . She does not have a routine exercise program   Social Determinants of Health   Financial Resource Strain: Low Risk    Difficulty of Paying Living Expenses: Not hard at all  Food Insecurity: No Food Insecurity   Worried About Charity fundraiser in the Last Year: Never true   Arboriculturist in  the Last Year: Never true  Transportation Needs: No Transportation Needs   Lack of Transportation (Medical): No   Lack of Transportation (Non-Medical): No  Physical Activity: Insufficiently Active   Days of Exercise per Week: 2 days   Minutes of Exercise per Session: 50 min  Stress: No Stress Concern Present   Feeling of Stress : Not at all  Social Connections: Socially Integrated   Frequency of Communication with Friends and Family: Twice a week   Frequency of Social Gatherings with Friends and Family: Twice a week   Attends Religious Services: More than 4 times per year   Active Member of [employer] or Organizations: Yes   Attends Music therapist: More than 4 times per year   Marital Status: Living with partner    Tobacco Counseling Counseling given: Not Answered   Clinical Intake:  Pre-visit preparation completed: Yes  Pain : 0-10 Pain Score: 2  Pain Type: Chronic pain Pain Location: Back Pain Orientation: Right, Left, Lower Pain Descriptors / Indicators: Aching Pain Onset: More than a month ago Pain Frequency: Constant Pain Relieving Factors: rest/sitting Effect of Pain on Daily Activities: Pain can lower motivation to exercise, and prevent you from completing daily tasks. Pain produces disability and affects the quality of life.  Pain Relieving Factors: rest/sitting  BMI - recorded: 34.41 Nutritional Status: BMI > 30  Obese Nutritional Risks: None (low carb diet) Diabetes: Yes CBG done?: No Did pt. bring in CBG monitor from  home?: No  How often do you need to have someone help you when you read instructions, pamphlets, or other written materials from your doctor or pharmacy?: 1 - Never What is the last grade level you completed in school?: Post Graduate  Diabetic? yes  Interpreter Needed?: No  Information entered by :: Lisette Abu, LPN   Activities of Daily Living In your present state of health, do you have any difficulty performing the following activities: 09/16/2021  Hearing? N  Vision? N  Difficulty concentrating or making decisions? Y  Walking or climbing stairs? Y  Dressing or bathing? N  Doing errands, shopping? N  Preparing Food and eating ? N  Using the Toilet? N  In the past six months, have you accidently leaked urine? Y  Do you have problems with loss of bowel control? N  Managing your Medications? N  Managing your Finances? N  Housekeeping or managing your Housekeeping? N  Some recent data might be hidden    Patient Care Team: Binnie Rail, MD as PCP - General (Internal Medicine) Troy Sine, MD as PCP - Cardiology (Cardiology) Jola Schmidt, MD as Consulting Physician (Ophthalmology) Gavin Pound, MD as Consulting Physician (Rheumatology)  Indicate any recent Medical Services you may have received from other than [facility] providers in the past year (date may be approximate).     Assessment:   This is a routine wellness examination for [place].  Hearing/Vision screen Hearing Screening - Comments:: Patient denied any hearing difficulty.   No hearing aids.  Vision Screening - Comments:: Patient wears corrective glasses/contacts.  Eye exam done annually by: Dr. Jola Schmidt  Dietary issues and exercise activities discussed: Current Exercise Habits: Home exercise routine (Physical Therapy 2 times a week), Time (Minutes): 50, Frequency (Times/Week): 2, Weekly Exercise (Minutes/Week): 100, Intensity: Mild, Exercise limited by: orthopedic condition(s);Other - see  comments   Goals Addressed   None   Depression Screen PHQ 2/9 Scores 09/16/2021 09/16/2021 04/24/2021 05/03/2020 04/05/2020 11/14/2015  PHQ - 2 Score 0 0  0 2 2 0  PHQ- 9 Score - - - 6 6 -    Fall Risk Fall Risk  09/16/2021 09/16/2021 05/03/2020 08/15/2019 11/14/2015  Falls in the past year? 0 0 0 0 No  Number falls in past yr: 0 0 0 0 -  Injury with Fall? 0 0 0 - -  Risk for fall due to : No Fall Risks - Other (Comment) - -  Risk for fall due to: Comment - - wound on her feet; she uses a w/c or cane for assitance. - -  Follow up Falls evaluation completed - Falls evaluation completed - -    FALL RISK PREVENTION PERTAINING TO THE HOME:  Any stairs in or around the home? Yes  (patient has a stairlift) If so, are there any without handrails? No  Home free of loose throw rugs in walkways, pet beds, electrical cords, etc? Yes  Adequate lighting in your home to reduce risk of falls? Yes   ASSISTIVE DEVICES UTILIZED TO PREVENT FALLS:  Life alert? No  Use of a cane, walker or w/c? Yes  Grab bars in the bathroom? Yes  Shower chair or bench in shower? Yes  Elevated toilet seat or a handicapped toilet? Yes   TIMED UP AND GO:  Was the test performed? Yes .  Length of time to ambulate 10 feet: 13 sec.   Gait slow and steady with assistive device  Cognitive Function: normal     6CIT Screen 09/16/2021  What Year? 0 points  What month? 0 points  What time? 0 points  Count back from 20 0 points  Months in reverse 0 points  Repeat phrase 0 points  Total Score 0    Immunizations Immunization History  Administered Date(s) Administered   Fluad Quad(high Dose 65+) 05/20/2020, 05/26/2021   Influenza Split 04/11/2018   Influenza, High Dose Seasonal PF 04/27/2016, 04/29/2017, 05/09/2019   Influenza,inj,Quad PF,6+ Mos 05/18/2014, 05/11/2019   Influenza,inj,quad, With Preservative 04/12/2017   Influenza-Unspecified 05/10/2014   Moderna Covid-19 Vaccine Bivalent Booster 11yrs & up 04/22/2021    Moderna Sars-Covid-2 Vaccination 09/09/2019, 10/07/2019, 04/09/2020, 09/19/2020   Pneumococcal Conjugate-13 10/02/2014   Pneumococcal Polysaccharide-23 11/20/2010, 07/28/2018   Td 08/10/2008, 09/13/2018   Unspecified SARS-COV-2 Vaccination 09/19/2020   Zoster Recombinat (Shingrix) 03/12/2017, 02/17/2018    TDAP status: Up to date  Flu Vaccine status: Up to date  Pneumococcal vaccine status: Up to date  Covid-19 vaccine status: Completed vaccines  Qualifies for Shingles Vaccine? Yes   Zostavax completed Yes   Shingrix Completed?: Yes  Screening Tests Health Maintenance  Topic Date Due   HEMOGLOBIN A1C  05/17/2021   FOOT EXAM  07/29/2021   Hepatitis C Screening  11/18/2068 (Originally 07/27/1964)   OPHTHALMOLOGY EXAM  05/26/2022   TETANUS/TDAP  09/13/2028   Pneumonia Vaccine 98+ Years old  Completed   INFLUENZA VACCINE  Completed   DEXA SCAN  Completed   COVID-19 Vaccine  Completed   Zoster Vaccines- Shingrix  Completed   HPV VACCINES  Aged Out    Health Maintenance  Health Maintenance Due  Topic Date Due   HEMOGLOBIN A1C  05/17/2021   FOOT EXAM  07/29/2021    Colorectal screening status: scheduled for EGD/Colonoscopy 09/17/2021.  Mammogram status: Completed 07/2021. Repeat every year (per patient)  Bone Density status: Completed 06/09/2017. Results reflect: Bone density results: NORMAL. Repeat every 5 years.  Lung Cancer Screening: (Low Dose CT Chest recommended if Age 34-80 years, 30 pack-year currently smoking OR have quit w/in  15years.) does not qualify.   Lung Cancer Screening Referral: no  Additional Screening:  Hepatitis C Screening: does qualify; Completed no  Vision Screening: Recommended annual ophthalmology exams for early detection of glaucoma and other disorders of the eye. Is the patient up to date with their annual eye exam?  Yes  Who is the provider or what is the name of the office in which the patient attends annual eye exams? Jola Schmidt,  MD. If pt is not established with a provider, would they like to be referred to a provider to establish care? No .   Dental Screening: Recommended annual dental exams for proper oral hygiene  Community Resource Referral / Chronic Care Management: CRR required this visit?  No   CCM required this visit?  No      Plan:     I have personally reviewed and noted the following in the patients chart:   Medical and social history Use of alcohol, tobacco or illicit drugs  Current medications and supplements including opioid prescriptions.  Functional ability and status Nutritional status Physical activity Advanced directives List of other physicians Hospitalizations, surgeries, and ER visits in previous 12 months Vitals Screenings to include cognitive, depression, and falls Referrals and appointments  In addition, I have reviewed and discussed with patient certain preventive protocols, quality metrics, and best practice recommendations. A written personalized care plan for preventive services as well as general preventive health recommendations were provided to patient.     Sheral Flow, LPN   11/12/9751   Nurse Notes:  Hearing Screening - Comments:: Patient denied any hearing difficulty.   No hearing aids.  Vision Screening - Comments:: Patient wears corrective glasses/contacts.  Eye exam done annually by: Dr. Jola Schmidt

## 2021-09-16 NOTE — Assessment & Plan Note (Signed)
Chronic Bilateral feet numbness Following with podiatry

## 2021-09-16 NOTE — Assessment & Plan Note (Addendum)
Chronic Blood pressure well controlled On occasion has had some lightheadedness-advised that we will need to monitor this to make sure you are but its not related to dropping blood pressure in which case we may need to decrease her blood pressure medication slightly CMP Continue Coreg 6.25 mg twice daily, lisinopril-HCTZ 20-25 mg daily

## 2021-09-16 NOTE — Assessment & Plan Note (Signed)
Chronic Controlled, Stable Continue sertraline 100 mg daily Continue alprazolam 0.25 mg twice daily as needed

## 2021-09-16 NOTE — Assessment & Plan Note (Signed)
Chronic Following with rheumatology On methotrexate 

## 2021-09-16 NOTE — Assessment & Plan Note (Signed)
Chronic Regular exercise and healthy diet encouraged Check lipid panel  Continue rosuvastatin 5 mg daily 

## 2021-09-16 NOTE — Assessment & Plan Note (Addendum)
Chronic Sugars well controlled at home Check A1c Currently taking Levemir 25 mg twice daily, metformin XR 500 mg twice daily

## 2021-09-16 NOTE — Patient Instructions (Signed)
[name] [name] , Thank you for taking time to come for your Medicare Wellness Visit. I appreciate your ongoing commitment to your health goals. Please review the following plan we discussed and let me know if I can assist you in the future.   Screening recommendations/referrals: Colonoscopy: Not a candidate for screening due to age 76: 08/10/2019; due every year (overdue) Bone Density: 06/09/2017; normal results Recommended yearly ophthalmology/optometry visit for glaucoma screening and checkup Recommended yearly dental visit for hygiene and checkup  Vaccinations: Influenza vaccine: 05/20/2020; (overdue) Pneumococcal vaccine: 10/02/2014, 07/28/2018 Tdap vaccine: 09/13/2018; due every 10 years (due 09/13/2028) Shingles vaccine: 03/12/2017, 02/17/2018   Covid-19: 09/09/2019, 10/07/2019, 04/09/2020, 09/19/2020, 04/22/2021  Advanced directives: Yes; Living Will documents are on file.  Conditions/risks identified: Yes; Patient is to continue with physical therapy, carb diet and stay hydrated as much as possible.  Next appointment: Please schedule your next Medicare Wellness Visit with your Nurse Health Advisor in 1 year by calling [phone].   Preventive Care 33 Years and Older, Female Preventive care refers to lifestyle choices and visits with your health care provider that can promote health and wellness. What does preventive care include? A yearly physical exam. This is also called an annual well check. Dental exams once or twice a year. Routine eye exams. Ask your health care provider how often you should have your eyes checked. Personal lifestyle choices, including: Daily care of your teeth and gums. Regular physical activity. Eating a healthy diet. Avoiding tobacco and drug use. Limiting alcohol use. Practicing safe sex. Taking low-dose aspirin every day. Taking vitamin and mineral supplements as recommended by your health care provider. What happens during an annual well check? The  services and screenings done by your health care provider during your annual well check will depend on your age, overall health, lifestyle risk factors, and family history of disease. Counseling  Your health care provider may ask you questions about your: Alcohol use. Tobacco use. Drug use. Emotional well-being. Home and relationship well-being. Sexual activity. Eating habits. History of falls. Memory and ability to understand (cognition). Work and work Statistician. Reproductive health. Screening  You may have the following tests or measurements: Height, weight, and BMI. Blood pressure. Lipid and cholesterol levels. These may be checked every 5 years, or more frequently if you are over 39 years old. Skin check. Lung cancer screening. You may have this screening every year starting at age 74 if you have a 30-pack-year history of smoking and currently smoke or have quit within the past 15 years. Fecal occult blood test (FOBT) of the stool. You may have this test every year starting at age 18. Flexible sigmoidoscopy or colonoscopy. You may have a sigmoidoscopy every 5 years or a colonoscopy every 10 years starting at age 28. Hepatitis C blood test. Hepatitis B blood test. Sexually transmitted disease (STD) testing. Diabetes screening. This is done by checking your blood sugar (glucose) after you have not eaten for a while (fasting). You may have this done every 1-3 years. Bone density scan. This is done to screen for osteoporosis. You may have this done starting at age 52. Mammogram. This may be done every 1-2 years. Talk to your health care provider about how often you should have regular mammograms. Talk with your health care provider about your test results, treatment options, and if necessary, the need for more tests. Vaccines  Your health care provider may recommend certain vaccines, such as: Influenza vaccine. This is recommended every year. Tetanus, diphtheria, and acellular  pertussis (Tdap, Td) vaccine. You may need a Td booster every 10 years. Zoster vaccine. You may need this after age 10. Pneumococcal 13-valent conjugate (PCV13) vaccine. One dose is recommended after age 88. Pneumococcal polysaccharide (PPSV23) vaccine. One dose is recommended after age 18. Talk to your health care provider about which screenings and vaccines you need and how often you need them. This information is not intended to replace advice given to you by your health care provider. Make sure you discuss any questions you have with your health care provider. Document Released: 08/23/2015 Document Revised: 04/15/2016 Document Reviewed: 05/28/2015 Elsevier Interactive Patient Education  2017 [place] Prevention in the Home Falls can cause injuries. They can happen to people of all ages. There are many things you can do to make your home safe and to help prevent falls. What can I do on the outside of my home? Regularly fix the edges of walkways and driveways and fix any cracks. Remove anything that might make you trip as you walk through a door, such as a raised step or threshold. Trim any bushes or trees on the path to your home. Use bright outdoor lighting. Clear any walking paths of anything that might make someone trip, such as rocks or tools. Regularly check to see if handrails are loose or broken. Make sure that both sides of any steps have handrails. Any raised decks and porches should have guardrails on the edges. Have any leaves, snow, or ice cleared regularly. Use sand or salt on walking paths during winter. Clean up any spills in your garage right away. This includes oil or grease spills. What can I do in the bathroom? Use night lights. Install grab bars by the toilet and in the tub and shower. Do not use towel bars as grab bars. Use non-skid mats or decals in the tub or shower. If you need to sit down in the shower, use a plastic, non-slip stool. Keep the floor  dry. Clean up any water that spills on the floor as soon as it happens. Remove soap buildup in the tub or shower regularly. Attach bath mats securely with double-sided non-slip rug tape. Do not have throw rugs and other things on the floor that can make you trip. What can I do in the bedroom? Use night lights. Make sure that you have a light by your bed that is easy to reach. Do not use any sheets or blankets that are too big for your bed. They should not hang down onto the floor. Have a firm chair that has side arms. You can use this for support while you get dressed. Do not have throw rugs and other things on the floor that can make you trip. What can I do in the kitchen? Clean up any spills right away. Avoid walking on wet floors. Keep items that you use a lot in easy-to-reach places. If you need to reach something above you, use a strong step stool that has a grab bar. Keep electrical cords out of the way. Do not use floor polish or wax that makes floors slippery. If you must use wax, use non-skid floor wax. Do not have throw rugs and other things on the floor that can make you trip. What can I do with my stairs? Do not leave any items on the stairs. Make sure that there are handrails on both sides of the stairs and use them. Fix handrails that are broken or loose. Make sure that handrails are  as long as the stairways. Check any carpeting to make sure that it is firmly attached to the stairs. Fix any carpet that is loose or worn. Avoid having throw rugs at the top or bottom of the stairs. If you do have throw rugs, attach them to the floor with carpet tape. Make sure that you have a light switch at the top of the stairs and the bottom of the stairs. If you do not have them, ask someone to add them for you. What else can I do to help prevent falls? Wear shoes that: Do not have high heels. Have rubber bottoms. Are comfortable and fit you well. Are closed at the toe. Do not wear  sandals. If you use a stepladder: Make sure that it is fully opened. Do not climb a closed stepladder. Make sure that both sides of the stepladder are locked into place. Ask someone to hold it for you, if possible. Clearly mark and make sure that you can see: Any grab bars or handrails. First and last steps. Where the edge of each step is. Use tools that help you move around (mobility aids) if they are needed. These include: Canes. Walkers. Scooters. Crutches. Turn on the lights when you go into a dark area. Replace any light bulbs as soon as they burn out. Set up your furniture so you have a clear path. Avoid moving your furniture around. If any of your floors are uneven, fix them. If there are any pets around you, be aware of where they are. Review your medicines with your doctor. Some medicines can make you feel dizzy. This can increase your chance of falling. Ask your doctor what other things that you can do to help prevent falls. This information is not intended to replace advice given to you by your health care provider. Make sure you discuss any questions you have with your health care provider. Document Released: 05/23/2009 Document Revised: 01/02/2016 Document Reviewed: 08/31/2014 Elsevier Interactive Patient Education  2017 [employer].

## 2021-09-16 NOTE — Assessment & Plan Note (Signed)
Chronic No flares with gout since her last visit Uric acid level well controlled last year when checked Continue allopurinol 100 mg twice daily

## 2021-09-16 NOTE — Assessment & Plan Note (Signed)
Chronic Following with GI On budesonide, methotrexate and Cimzia

## 2021-09-17 ENCOUNTER — Ambulatory Visit (AMBULATORY_SURGERY_CENTER): Payer: Medicare Other | Admitting: Internal Medicine

## 2021-09-17 ENCOUNTER — Other Ambulatory Visit: Payer: Self-pay

## 2021-09-17 ENCOUNTER — Encounter: Payer: Self-pay | Admitting: Internal Medicine

## 2021-09-17 VITALS — BP 137/65 | HR 80 | Temp 97.7°F | Resp 15 | Ht 65.0 in | Wt 218.0 lb

## 2021-09-17 DIAGNOSIS — K52839 Microscopic colitis, unspecified: Secondary | ICD-10-CM | POA: Diagnosis not present

## 2021-09-17 DIAGNOSIS — D123 Benign neoplasm of transverse colon: Secondary | ICD-10-CM

## 2021-09-17 DIAGNOSIS — K5730 Diverticulosis of large intestine without perforation or abscess without bleeding: Secondary | ICD-10-CM | POA: Diagnosis not present

## 2021-09-17 DIAGNOSIS — K222 Esophageal obstruction: Secondary | ICD-10-CM

## 2021-09-17 DIAGNOSIS — K317 Polyp of stomach and duodenum: Secondary | ICD-10-CM | POA: Diagnosis not present

## 2021-09-17 DIAGNOSIS — K2970 Gastritis, unspecified, without bleeding: Secondary | ICD-10-CM

## 2021-09-17 DIAGNOSIS — G4730 Sleep apnea, unspecified: Secondary | ICD-10-CM | POA: Diagnosis not present

## 2021-09-17 DIAGNOSIS — D128 Benign neoplasm of rectum: Secondary | ICD-10-CM | POA: Diagnosis not present

## 2021-09-17 DIAGNOSIS — R1310 Dysphagia, unspecified: Secondary | ICD-10-CM | POA: Diagnosis not present

## 2021-09-17 DIAGNOSIS — K642 Third degree hemorrhoids: Secondary | ICD-10-CM

## 2021-09-17 DIAGNOSIS — K2950 Unspecified chronic gastritis without bleeding: Secondary | ICD-10-CM | POA: Diagnosis not present

## 2021-09-17 DIAGNOSIS — D120 Benign neoplasm of cecum: Secondary | ICD-10-CM | POA: Diagnosis not present

## 2021-09-17 DIAGNOSIS — D122 Benign neoplasm of ascending colon: Secondary | ICD-10-CM

## 2021-09-17 DIAGNOSIS — K449 Diaphragmatic hernia without obstruction or gangrene: Secondary | ICD-10-CM | POA: Diagnosis not present

## 2021-09-17 DIAGNOSIS — K219 Gastro-esophageal reflux disease without esophagitis: Secondary | ICD-10-CM | POA: Diagnosis not present

## 2021-09-17 DIAGNOSIS — E119 Type 2 diabetes mellitus without complications: Secondary | ICD-10-CM | POA: Diagnosis not present

## 2021-09-17 MED ORDER — SODIUM CHLORIDE 0.9 % IV SOLN: 500.0000 mL | Freq: Once | INTRAVENOUS | Status: DC

## 2021-09-17 NOTE — Progress Notes (Signed)
GASTROENTEROLOGY PROCEDURE H&P NOTE   Primary Care Physician: Binnie Rail, MD    Reason for Procedure:   Microscopic colitis, dysphagia, polyp surveillance  Plan:    EGD/colonoscopy  Patient is appropriate for endoscopic procedure(s) in the ambulatory ([facility]) setting.  The nature of the procedure, as well as the risks, benefits, and alternatives were carefully and thoroughly reviewed with the patient. Ample time for discussion and questions allowed. The patient understood, was satisfied, and agreed to proceed.     HPI: [name] [name] is a 76 y.o. female who presents for EGD/colonoscopy for evaluation of dysphagia, polyp surveillance, microscopic colitis .  Patient was most recently seen in the Gastroenterology Clinic on 07/16/21.  No interval change in medical history since that appointment. Please refer to that note for full details regarding GI history and clinical presentation.   Past Medical History:  Diagnosis Date   Arthritis    Closed fracture of fifth metatarsal bone 05/13/2018   Deep vein thrombosis (HCC)    right calf - 05/2012    Diabetes mellitus without complication (HCC)    diet controlled    GERD (gastroesophageal reflux disease)    Hyperlipidemia    Hypertension    Ejection fraction =>55% Left ventricular systolic function is normal. Left ventricular wall motion is normal     Lymphocytic colitis    Neuropathy    diabetic - in bilateral feet    Pityriasis lichenoides chronica    Sleep apnea    bipap    Past Surgical History:  Procedure Laterality Date   DILATION AND CURETTAGE OF UTERUS     EYE SURGERY  07/2020   HAMMER TOE SURGERY     right hand surgery      due to blood infection    torn meniscus repair      right knee    TOTAL HIP ARTHROPLASTY  09/13/2012   Procedure: TOTAL HIP ARTHROPLASTY ANTERIOR APPROACH;  Surgeon: Mauri Pole, MD;  Location: [facility];  Service: Orthopedics;  Laterality: Right;    Prior to Admission medications   Medication  Sig Start Date End Date Taking? Authorizing Provider  Accu-Chek Softclix Lancets lancets Use as instructed to check sugars 2-3 times daily.  E11.42 04/27/21   Binnie Rail, MD  acetaminophen (TYLENOL) 325 MG tablet Take 2 tablets (650 mg total) by mouth every 6 (six) hours as needed for mild pain or headache. 07/13/19   Elgergawy, Silver Huguenin, MD  Alcohol Swabs PADS UAD to check sugars.  E11.65 12/18/20   Binnie Rail, MD  allopurinol (ZYLOPRIM) 100 MG tablet TAKE 1 TABLET(100 MG) BY MOUTH TWICE DAILY 04/16/21   Burns, Claudina Lick, MD  ALPRAZolam (XANAX) 0.25 MG tablet TAKE 1 TABLET(0.25 MG) BY MOUTH TWICE DAILY AS NEEDED FOR ANXIETY 10/02/20   Binnie Rail, MD  ascorbic acid (VITAMIN C) 500 MG tablet Take 500 mg by mouth at bedtime.    [provider]  aspirin 81 MG EC tablet 1 tablet    [provider]  augmented betamethasone dipropionate (DIPROLENE-AF) 0.05 % cream betamethasone, augmented 0.05 % topical cream    [provider]  azelastine (ASTELIN) 0.1 % nasal spray INSTILL 2 SPRAYS IN EACH NOSTRIL TWICE DAILY 01/23/21   Binnie Rail, MD  Blood Glucose Monitoring Suppl (ACCU-CHEK GUIDE) w/Device KIT AS DIRECTED 04/21/21   Binnie Rail, MD  Budesonide 9 MG TB24 Take 9 mg by mouth daily.     [provider]  carvedilol (COREG) 6.25 MG tablet TAKE 1 TABLET(6.25 MG) BY MOUTH TWICE DAILY WITH A MEAL 07/15/21   Burns, Claudina Lick, MD  Certolizumab Pegol (CIMZIA [place]) Inject 400 mg into the skin every 30 (thirty) days. 200 mg on each side of stomach    [provider]  Cholecalciferol (VITAMIN D3) 1000 UNITS CAPS Take 1,000 Units by mouth 2 (two) times daily.     [provider]  Cyanocobalamin (VITAMIN B-12) 1000 MCG SUBL Place 1,000 mcg under the tongue daily.    [provider]  cyclobenzaprine (FLEXERIL) 10 MG tablet TAKE 1 TABLET BY MOUTH AT BEDTIME 06/12/21   Burns, Claudina Lick, MD  DULCOLAX 100 MG capsule TAKE 1 CAPSULE(100 MG) BY MOUTH TWICE DAILY  05/26/21   Burns, Claudina Lick, MD  folic acid (FOLVITE) 1 MG tablet TAKE 1 TABLET(1 MG) BY MOUTH DAILY 04/16/21   Binnie Rail, MD  glucose blood (ACCU-CHEK GUIDE) test strip Use as instructed to check sugars 2-3 times daily.  E11.42 04/27/21   Binnie Rail, MD  Insulin Pen Needle (B-D UF III MINI PEN NEEDLES) 31G X 5 MM MISC Use daily for insulin pen 12/18/20   Burns, Claudina Lick, MD  LEVEMIR FLEXTOUCH 100 UNIT/ML FlexTouch Pen Inject 25 Units into the skin 2 (two) times daily. 05/26/21   Binnie Rail, MD  lisinopril-hydrochlorothiazide (ZESTORETIC) 20-25 MG tablet TAKE 1 TABLET BY MOUTH DAILY 04/16/21   Binnie Rail, MD  loperamide (IMODIUM) 2 MG capsule Take 6 mg by mouth 2 (two) times daily.    [provider]  Melatonin 5 MG SUBL Place 5 mg under the tongue at bedtime.    [provider]  metFORMIN (GLUCOPHAGE-XR) 500 MG 24 hr tablet TAKE 1 TABLET(500 MG) BY MOUTH TWICE DAILY 05/26/21   Binnie Rail, MD  methotrexate (RHEUMATREX) 2.5 MG tablet Take 20 mg by mouth every Friday.  05/10/20   [provider]  naftifine (NAFTIN) 1 % cream Apply 1 application topically daily as needed (rash).  06/27/20   [provider]  omeprazole (PRILOSEC) 40 MG capsule TAKE 1 CAPSULE BY MOUTH EVERY DAY 04/16/21   Binnie Rail, MD  potassium chloride (KLOR-CON) 10 MEQ tablet Take 3 tablets (30 mEq total) by mouth daily. 02/14/21   Binnie Rail, MD  Probiotic Product (ALIGN PO) Take 1 capsule by mouth daily.    [provider]  rosuvastatin (CRESTOR) 5 MG tablet TAKE 1 TABLET(5 MG) BY MOUTH DAILY 04/21/21   Binnie Rail, MD  sertraline (ZOLOFT) 100 MG tablet Take 1 tablet (100 mg total) by mouth daily. 05/26/21   Binnie Rail, MD    Current Outpatient Medications  Medication Sig Dispense Refill   Accu-Chek Softclix Lancets lancets Use as instructed to check sugars 2-3 times daily.  E11.42 100 each 12   acetaminophen (TYLENOL) 325 MG tablet Take 2 tablets (650 mg total)  by mouth every 6 (six) hours as needed for mild pain or headache.     Alcohol Swabs PADS UAD to check sugars.  E11.65 100 each 12   allopurinol (ZYLOPRIM) 100 MG tablet TAKE 1 TABLET(100 MG) BY MOUTH TWICE DAILY 180 tablet 3   ALPRAZolam (XANAX) 0.25 MG tablet TAKE 1 TABLET(0.25 MG) BY MOUTH TWICE DAILY AS NEEDED FOR ANXIETY 20 tablet 0   ascorbic acid (VITAMIN C) 500 MG tablet Take 500 mg by mouth at bedtime.     aspirin 81 MG EC tablet 1 tablet  augmented betamethasone dipropionate (DIPROLENE-AF) 0.05 % cream betamethasone, augmented 0.05 % topical cream     azelastine (ASTELIN) 0.1 % nasal spray INSTILL 2 SPRAYS IN EACH NOSTRIL TWICE DAILY 30 mL 12   Blood Glucose Monitoring Suppl (ACCU-CHEK GUIDE) w/Device KIT AS DIRECTED 1 kit 0   Budesonide 9 MG TB24 Take 9 mg by mouth daily.      carvedilol (COREG) 6.25 MG tablet TAKE 1 TABLET(6.25 MG) BY MOUTH TWICE DAILY WITH A MEAL 60 tablet 3   Certolizumab Pegol (CIMZIA [place]) Inject 400 mg into the skin every 30 (thirty) days. 200 mg on each side of stomach     Cholecalciferol (VITAMIN D3) 1000 UNITS CAPS Take 1,000 Units by mouth 2 (two) times daily.      Cyanocobalamin (VITAMIN B-12) 1000 MCG SUBL Place 1,000 mcg under the tongue daily.     cyclobenzaprine (FLEXERIL) 10 MG tablet TAKE 1 TABLET BY MOUTH AT BEDTIME 90 tablet 1   DULCOLAX 100 MG capsule TAKE 1 CAPSULE(100 MG) BY MOUTH TWICE DAILY 60 capsule 2   folic acid (FOLVITE) 1 MG tablet TAKE 1 TABLET(1 MG) BY MOUTH DAILY 90 tablet 3   glucose blood (ACCU-CHEK GUIDE) test strip Use as instructed to check sugars 2-3 times daily.  E11.42 100 each 12   Insulin Pen Needle (B-D UF III MINI PEN NEEDLES) 31G X 5 MM MISC Use daily for insulin pen 90 each 3   LEVEMIR FLEXTOUCH 100 UNIT/ML FlexTouch Pen Inject 25 Units into the skin 2 (two) times daily. 50 mL 5   lisinopril-hydrochlorothiazide (ZESTORETIC) 20-25 MG tablet TAKE 1 TABLET BY MOUTH DAILY 90 tablet 1   loperamide (IMODIUM) 2 MG capsule Take 6  mg by mouth 2 (two) times daily.     Melatonin 5 MG SUBL Place 5 mg under the tongue at bedtime.     metFORMIN (GLUCOPHAGE-XR) 500 MG 24 hr tablet TAKE 1 TABLET(500 MG) BY MOUTH TWICE DAILY 180 tablet 2   methotrexate (RHEUMATREX) 2.5 MG tablet Take 20 mg by mouth every Friday.      naftifine (NAFTIN) 1 % cream Apply 1 application topically daily as needed (rash).      omeprazole (PRILOSEC) 40 MG capsule TAKE 1 CAPSULE BY MOUTH EVERY DAY 90 capsule 1   potassium chloride (KLOR-CON) 10 MEQ tablet Take 3 tablets (30 mEq total) by mouth daily. 270 tablet 3   Probiotic Product (ALIGN PO) Take 1 capsule by mouth daily.     rosuvastatin (CRESTOR) 5 MG tablet TAKE 1 TABLET(5 MG) BY MOUTH DAILY 90 tablet 3   sertraline (ZOLOFT) 100 MG tablet Take 1 tablet (100 mg total) by mouth daily. 90 tablet 1   Current Facility-Administered Medications  Medication Dose Route Frequency Provider Last Rate Last Admin   0.9 %  sodium chloride infusion  500 mL Intravenous Once Sharyn Creamer, MD        Allergies as of 09/17/2021 - Review Complete 09/17/2021  Allergen Reaction Noted   Other Shortness Of Breath 10/06/2014   Thorazine [chlorpromazine] Other (See Comments) 05/26/2021   Bee venom Swelling 05/01/2018   Farxiga [dapagliflozin]  10/24/2019   Semaglutide  12/27/2020   Sulfa antibiotics Hives 09/06/2012    Family History  Problem Relation Age of Onset   Hypertension Mother     Social History   Socioeconomic History   Marital status: Divorced    Spouse name: Not on file   Number of children: Not on file   Years of education: Not  on file   Highest education level: Not on file  Occupational History   Not on file  Tobacco Use   Smoking status: Never   Smokeless tobacco: Never  Substance and Sexual Activity   Alcohol use: Yes    Comment: occasional wine    Drug use: No   Sexual activity: Not on file  Other Topics Concern   Not on file  Social History Narrative   She is widowed. She has 2  children, 2 grandchildren. She does drink occasional alcohol . There is no tobacco . She does not have a routine exercise program   Social Determinants of Health   Financial Resource Strain: Low Risk    Difficulty of Paying Living Expenses: Not hard at all  Food Insecurity: No Food Insecurity   Worried About Charity fundraiser in the Last Year: Never true   [place] in the Last Year: Never true  Transportation Needs: No Transportation Needs   Lack of Transportation (Medical): No   Lack of Transportation (Non-Medical): No  Physical Activity: Insufficiently Active   Days of Exercise per Week: 2 days   Minutes of Exercise per Session: 50 min  Stress: No Stress Concern Present   Feeling of Stress : Not at all  Social Connections: Socially Integrated   Frequency of Communication with Friends and Family: Twice a week   Frequency of Social Gatherings with Friends and Family: Twice a week   Attends Religious Services: More than 4 times per year   Active Member of [employer] or Organizations: Yes   Attends Music therapist: More than 4 times per year   Marital Status: Living with partner  Intimate Partner Violence: Not At Risk   Fear of Current or Ex-Partner: No   Emotionally Abused: No   Physically Abused: No   Sexually Abused: No    Physical Exam: Vital signs in last 24 hours: BP (!) 143/83    Pulse 85    Temp 97.7 F (36.5 C) (Skin)    Ht $R'5\' 5"'si$  (1.651 m)    Wt 218 lb (98.9 kg)    SpO2 98%    BMI 36.28 kg/m  GEN: NAD EYE: Sclerae anicteric ENT: MMM CV: Non-tachycardic Pulm: No increased WOB GI: Soft NEURO:  Alert & Oriented   [name], MD [facility]   09/17/2021 10:26 AM

## 2021-09-17 NOTE — Op Note (Signed)
[facility] Patient Name: [name] [name] Procedure Date: 09/17/2021 10:55 AM MRN: [id number] Endoscopist: Sonny Masters "[name] [name] ,  Age: 76 Referring MD:  Date of Birth: 09-08-1945 Gender: Female Account #: [id number] Procedure:                Colonoscopy Indications:              High risk colon cancer surveillance: Personal                            history of colonic polyps, Incidental - Follow-up                            of microscopic colitis Medicines:                Monitored Anesthesia Care Procedure:                Pre-Anesthesia Assessment:                           - Prior to the procedure, a History and Physical                            was performed, and patient medications and                            allergies were reviewed. The patient's tolerance of                            previous anesthesia was also reviewed. The risks                            and benefits of the procedure and the sedation                            options and risks were discussed with the patient.                            All questions were answered, and informed consent                            was obtained. Prior Anticoagulants: The patient has                            taken no previous anticoagulant or antiplatelet                            agents. ASA Grade Assessment: III - A patient with                            severe systemic disease. After reviewing the risks                            and benefits, the patient was deemed in  satisfactory condition to undergo the procedure.                           After obtaining informed consent, the colonoscope                            was passed under direct vision. Throughout the                            procedure, the patient's blood pressure, pulse, and                            oxygen saturations were monitored continuously. The                            CF HQ190L #[id number] was introduced  through the anus                            and advanced to the the cecum, identified by                            appendiceal orifice and ileocecal valve. The                            colonoscopy was performed without difficulty. The                            patient tolerated the procedure well. The quality                            of the bowel preparation was good. The ileocecal                            valve, appendiceal orifice, and rectum were                            photographed. Scope In: 11:23:04 AM Scope Out: 12:11:54 PM Scope Withdrawal Time: 0 hours 37 minutes 13 seconds  Total Procedure Duration: 0 hours 48 minutes 50 seconds  Findings:                 A 12 mm polyp was found in the cecum. The polyp was                            sessile. The polyp was removed with a hot snare.                            Resection and retrieval were complete.                           Seven sessile polyps were found in the transverse                            colon and ascending colon. The polyps were 3  to 8                            mm in size. These polyps were removed with a cold                            snare. Resection and retrieval were complete.                           Multiple diverticula were found in the sigmoid                            colon and descending colon.                           A 7 mm polyp was found in the rectum. The polyp was                            sessile. The polyp was removed with a cold snare.                            Resection and retrieval were complete.                           Non-bleeding internal hemorrhoids were found during                            retroflexion and during perianal exam. The                            hemorrhoids were Grade III (internal hemorrhoids                            that prolapse but require manual reduction). Complications:            No immediate complications. Estimated Blood Loss:     Estimated  blood loss was minimal. Impression:               - One 12 mm polyp in the cecum, removed with a hot                            snare. Resected and retrieved.                           - Seven 3 to 8 mm polyps in the transverse colon                            and in the ascending colon, removed with a cold                            snare. Resected and retrieved.                           - Diverticulosis in the sigmoid colon and in the  descending colon.                           - One 7 mm polyp in the rectum, removed with a cold                            snare. Resected and retrieved.                           - Non-bleeding internal hemorrhoids. Recommendation:           - Discharge patient to home (with escort).                           - Await pathology results.                           - Return to GI clinic in 1 month.                           - The findings and recommendations were discussed                            with the patient. Sonny Masters "[name] [name],  09/17/2021 12:42:20 PM

## 2021-09-17 NOTE — Progress Notes (Signed)
Pt's states no medical or surgical changes since previsit or office visit. 

## 2021-09-17 NOTE — Progress Notes (Signed)
Called to room to assist during endoscopic procedure.  Patient ID and intended procedure confirmed with present staff. Received instructions for my participation in the procedure from the performing physician.  

## 2021-09-17 NOTE — Op Note (Signed)
[facility] Patient Name: [name] [name] Procedure Date: 09/17/2021 10:55 AM MRN: [id number] Endoscopist: Sonny Masters "[name] [name] ,  Age: 76 Referring MD:  Date of Birth: 1946-06-25 Gender: Female Account #: [id number] Procedure:                Upper GI endoscopy Indications:              Dysphagia Medicines:                Monitored Anesthesia Care Procedure:                Pre-Anesthesia Assessment:                           - Prior to the procedure, a History and Physical                            was performed, and patient medications and                            allergies were reviewed. The patient's tolerance of                            previous anesthesia was also reviewed. The risks                            and benefits of the procedure and the sedation                            options and risks were discussed with the patient.                            All questions were answered, and informed consent                            was obtained. Prior Anticoagulants: The patient has                            taken no previous anticoagulant or antiplatelet                            agents. ASA Grade Assessment: III - A patient with                            severe systemic disease. After reviewing the risks                            and benefits, the patient was deemed in                            satisfactory condition to undergo the procedure.                           After obtaining informed consent, the endoscope was  passed under direct vision. Throughout the                            procedure, the patient's blood pressure, pulse, and                            oxygen saturations were monitored continuously. The                            GIF HQ190 #[id number] was introduced through the                            mouth, and advanced to the second part of duodenum.                            The upper GI endoscopy was accomplished  without                            difficulty. The patient tolerated the procedure                            well. Scope In: Scope Out: Findings:                 Multiple mild (non-circumferential scarring)                            stenoses were found in the entire esophagus. The                            stenoses were traversed. A guidewire was placed and                            the scope was withdrawn. Dilation was performed                            with a Savary dilator with no resistance at 15 mm                            and mild resistance at 16 mm. The dilation site was                            examined and showed mild mucosal disruption in the                            upper esophagus.                           A small hiatal hernia was present.                           Localized mild inflammation characterized by                            congestion (edema), erythema and  granularity was                            found in the gastric body and in the gastric                            antrum. Biopsies were taken with a cold forceps for                            histology.                           Multiple sessile polyps with no bleeding and no                            stigmata of recent bleeding were found in the                            gastric body. Biopsies were taken with a cold                            forceps for histology.                           The examined duodenum was normal. Complications:            No immediate complications. Estimated Blood Loss:     Estimated blood loss was minimal. Impression:               - Mild esophageal stenoses. Dilated.                           - Small hiatal hernia.                           - Gastritis. Biopsied.                           - Multiple gastric polyps. Biopsied.                           - Normal examined duodenum. Recommendation:           - Await pathology results.                           -  Perform a colonoscopy today. Sonny Masters "[name] [name],  09/17/2021 12:32:13 PM

## 2021-09-17 NOTE — Progress Notes (Signed)
To pacu, VSS. Report to [email or web address].tb 

## 2021-09-17 NOTE — Patient Instructions (Signed)
Resume previous diet and medications. Awaiting pathology results.  Return to GI clinic in 1 month.  YOU HAD AN ENDOSCOPIC PROCEDURE TODAY AT [facility]:   Refer to the procedure report that was given to you for any specific questions about what was found during the examination.  If the procedure report does not answer your questions, please call your gastroenterologist to clarify.  If you requested that your care partner not be given the details of your procedure findings, then the procedure report has been included in a sealed envelope for you to review at your convenience later.  YOU SHOULD EXPECT: Some feelings of bloating in the abdomen. Passage of more gas than usual.  Walking can help get rid of the air that was put into your GI tract during the procedure and reduce the bloating. If you had a lower endoscopy (such as a colonoscopy or flexible sigmoidoscopy) you may notice spotting of blood in your stool or on the toilet paper. If you underwent a bowel prep for your procedure, you may not have a normal bowel movement for a few days.  Please Note:  You might notice some irritation and congestion in your nose or some drainage.  This is from the oxygen used during your procedure.  There is no need for concern and it should clear up in a day or so.  SYMPTOMS TO REPORT IMMEDIATELY:  Following lower endoscopy (colonoscopy or flexible sigmoidoscopy):  Excessive amounts of blood in the stool  Significant tenderness or worsening of abdominal pains  Swelling of the abdomen that is new, acute  Fever of 100F or higher  Following upper endoscopy (EGD)  Vomiting of blood or coffee ground material  New chest pain or pain under the shoulder blades  Painful or persistently difficult swallowing  New shortness of breath  Fever of 100F or higher  Black, tarry-looking stools  For urgent or emergent issues, a gastroenterologist can be reached at any hour by calling [phone]. Do not  use MyChart messaging for urgent concerns.    DIET:  We do recommend a small meal at first, but then you may proceed to your regular diet.  Drink plenty of fluids but you should avoid alcoholic beverages for 24 hours.  ACTIVITY:  You should plan to take it easy for the rest of today and you should NOT DRIVE or use heavy machinery until tomorrow (because of the sedation medicines used during the test).    FOLLOW UP: Our staff will call the number listed on your records 48-72 hours following your procedure to check on you and address any questions or concerns that you may have regarding the information given to you following your procedure. If we do not reach you, we will leave a message.  We will attempt to reach you two times.  During this call, we will ask if you have developed any symptoms of COVID 19. If you develop any symptoms (ie: fever, flu-like symptoms, shortness of breath, cough etc.) before then, please call [phone].  If you test positive for Covid 19 in the 2 weeks post procedure, please call and report this information to [place].    If any biopsies were taken you will be contacted by phone or by letter within the next 1-3 weeks.  Please call us at [phone] if you have not heard about the biopsies in 3 weeks.    SIGNATURES/CONFIDENTIALITY: You and/or your care partner have signed paperwork which will be entered into your electronic  medical record.  These signatures attest to the fact that that the information above on your After Visit Summary has been reviewed and is understood.  Full responsibility of the confidentiality of this discharge information lies with you and/or your care-partner.

## 2021-09-18 ENCOUNTER — Encounter: Payer: Self-pay | Admitting: Physical Therapy

## 2021-09-18 ENCOUNTER — Ambulatory Visit: Payer: Medicare Other | Admitting: Physical Therapy

## 2021-09-18 DIAGNOSIS — M6281 Muscle weakness (generalized): Secondary | ICD-10-CM

## 2021-09-18 DIAGNOSIS — R278 Other lack of coordination: Secondary | ICD-10-CM | POA: Diagnosis not present

## 2021-09-18 DIAGNOSIS — R262 Difficulty in walking, not elsewhere classified: Secondary | ICD-10-CM

## 2021-09-18 NOTE — Therapy (Signed)
[address] [place], [place], [address] Phone: [phone]   Fax:  [phone]  Physical Therapy Treatment  Patient Details  Name: [name] [name] MRN: [id number] Date of Birth: 1945-11-12 Referring Provider (PT): Lazaro Arms   Encounter Date: 09/18/2021   PT End of Session - 09/18/21 1137     Visit Number 28    Date for PT Re-Evaluation 10/06/21    Authorization Type MCR and BCBS    PT Start Time 1026    PT Stop Time 1111    PT Time Calculation (min) 45 min    Activity Tolerance Patient tolerated treatment well    Behavior During Therapy [facility] for tasks assessed/performed             Past Medical History:  Diagnosis Date   Arthritis    Closed fracture of fifth metatarsal bone 05/13/2018   Deep vein thrombosis ([place])    right calf - 05/2012    Diabetes mellitus without complication (HCC)    diet controlled    GERD (gastroesophageal reflux disease)    Hyperlipidemia    Hypertension    Ejection fraction =>55% Left ventricular systolic function is normal. Left ventricular wall motion is normal     Lymphocytic colitis    Neuropathy    diabetic - in bilateral feet    Pityriasis lichenoides chronica    Sleep apnea    bipap    Past Surgical History:  Procedure Laterality Date   DILATION AND CURETTAGE OF UTERUS     EYE SURGERY  07/2020   HAMMER TOE SURGERY     right hand surgery      due to blood infection    torn meniscus repair      right knee    TOTAL HIP ARTHROPLASTY  09/13/2012   Procedure: TOTAL HIP ARTHROPLASTY ANTERIOR APPROACH;  Surgeon: Mauri Pole, MD;  Location: [facility];  Service: Orthopedics;  Laterality: Right;    There were no vitals filed for this visit.   Subjective Assessment - 09/18/21 1029     Subjective Patient reports going slow and not feeling great, had a colonoscopy yesterday around lunch, she does report a fall at the MD office on Tuesday, she is unsure of why     Currently in Pain? No/denies                               [facility] Adult PT Treatment/Exercise - 09/18/21 0001       Lumbar Exercises: Aerobic   Nustep level 4 x 6 minues      Lumbar Exercises: Seated   Other Seated Lumbar Exercises red tband rows and extension arms      Knee/Hip Exercises: Seated   Long Arc Quad Both;2 sets;10 reps    Long Arc Quad Weight 3 lbs.    Ball Squeeze 20    Clamshell with TheraBand Red    Other Seated Knee/Hip Exercises red tband ankle PF/DF 2x12    Marching Both;2 sets;10 reps    Marching Weights 3 lbs.                       PT Short Term Goals - 07/23/21 1514       PT SHORT TERM GOAL #1   Title I with basic HEP    Baseline 12/14- almost every day, have been practice walking too but weather has  made it hard    Time 3    Period Weeks    Status On-going               PT Long Term Goals - 09/18/21 1139       PT LONG TERM GOAL #1   Title Improve motor strength in BLE to 4+/5, with good endurance to tolerate at least 2 x 20 reps of all strenht exercises.    Status Partially Met      PT LONG TERM GOAL #2   Title Improve 5x sit to stand time to 12 seconds from normal test height surface, in order to improve ability to rise from restaraunt chairs iwthout use of armrests.    Status Partially Met                   Plan - 09/18/21 1138     Clinical Impression Statement Patietn reports a fall on Tuesday at the [place] office, she is unsure of how or why, she reports no injury, she reports not feeling well today due to a colonoscopy yesterday, did more activity in sitting and avoided balance today.    PT Next Visit Plan resume the higher level activities as she tolerates    Consulted and Agree with Plan of Care Patient             [name]:  Abnormal gait, Decreased balance, Decreased  endurance, Decreased mobility, Difficulty walking, Impaired sensation, Improper body mechanics, Cardiopulmonary status limiting activity, Decreased activity tolerance, Decreased strength, Pain, Postural dysfunction  Visit Diagnosis: Difficulty in walking, not elsewhere classified  Muscle weakness (generalized)  Other lack of coordination     Problem List Patient Active Problem List   Diagnosis Date Noted   Chronic hiccups 05/01/2021   Memory loss 05/01/2021   History of COVID-19 05/01/2021   Olfactory impairment 05/01/2021   Senile purpura ([place]) 11/15/2020   Difficulty with speech 07/23/2020   SOB (shortness of breath) 07/23/2020   Dyslipidemia 55/73/2202   Pityriasis lichenoides chronica 09/18/2019   Insomnia 08/07/2019   Anxiety 08/07/2019   Psoriatic arthritis ([place]) -Dr. Trudie Reed 08/07/2019   Gout 08/07/2019   Recurrent UTI 08/07/2019   Charcot's joint of left foot 08/07/2019   Depression 08/07/2019   Pneumonia due to COVID-19 virus 07/09/2019   Physical deconditioning 07/08/2019   COVID-19 07/07/2019   Diabetic peripheral neuropathy ([place]) 04/10/2019   Pain in left knee 10/13/2018   Ischemic colitis ([place]) 10/06/2018   Urinary incontinence 07/21/2018   Diabetic foot ulcer ([place]) 07/13/2018   DM2 (diabetes mellitus, type 2) ([place]) 05/02/2018   Psoriasis 05/02/2018   Osteoarthritis of knee 10/18/2017   Right bundle branch block 05/20/2014   OSA (obstructive sleep apnea), BiPAP 03/12/2013   Essential hypertension 03/12/2013   Obesity 09/14/2012   Hyponatremia 09/14/2012   S/P right THA, AA 09/13/2012   Deep venous thrombosis of lower extremity ([place]) 08/10/2010   Lymphocytic-plasmacytic colitis 08/11/2007    Sumner Boast, PT 09/18/2021, 11:40 AM  [address]. [place], [place], [address] Phone: [phone]   Fax:  [phone]  Name: JINNIE ONLEY MRN: [id number] Date of Birth: Jun 05, 1946

## 2021-09-19 ENCOUNTER — Encounter: Payer: Self-pay | Admitting: Internal Medicine

## 2021-09-19 ENCOUNTER — Telehealth: Payer: Self-pay

## 2021-09-19 NOTE — Telephone Encounter (Signed)
°  Follow up Call-  Call back number 09/17/2021  Post procedure Call Back phone  # [phone]  Permission to leave phone message Yes  Some recent data might be hidden     Patient questions:  Do you have a fever, pain , or abdominal swelling? No. Pain Score  0 *  Have you tolerated food without any problems? Yes.    Have you been able to return to your normal activities? Yes.    Do you have any questions about your discharge instructions: Diet   No. Medications  No. Follow up visit  No.  Do you have questions or concerns about your Care? No.  Actions: * If pain score is 4 or above: No action needed, pain <4.  Have you developed a fever since your procedure? no  2.   Have you had an respiratory symptoms (SOB or cough) since your procedure? no  3.   Have you tested positive for COVID 19 since your procedure no  4.   Have you had any family members/close contacts diagnosed with the COVID 19 since your procedure?  no   If yes to any of these questions please route to Joylene John, RN and Joella Prince, RN

## 2021-09-22 ENCOUNTER — Ambulatory Visit: Payer: Medicare Other | Admitting: Physical Therapy

## 2021-09-22 ENCOUNTER — Telehealth: Payer: Self-pay | Admitting: *Deleted

## 2021-09-22 ENCOUNTER — Other Ambulatory Visit: Payer: Self-pay

## 2021-09-22 ENCOUNTER — Encounter: Payer: Self-pay | Admitting: Physical Therapy

## 2021-09-22 DIAGNOSIS — L820 Inflamed seborrheic keratosis: Secondary | ICD-10-CM | POA: Diagnosis not present

## 2021-09-22 DIAGNOSIS — D692 Other nonthrombocytopenic purpura: Secondary | ICD-10-CM | POA: Diagnosis not present

## 2021-09-22 DIAGNOSIS — L603 Nail dystrophy: Secondary | ICD-10-CM | POA: Diagnosis not present

## 2021-09-22 DIAGNOSIS — D1801 Hemangioma of skin and subcutaneous tissue: Secondary | ICD-10-CM | POA: Diagnosis not present

## 2021-09-22 DIAGNOSIS — M6281 Muscle weakness (generalized): Secondary | ICD-10-CM | POA: Diagnosis not present

## 2021-09-22 DIAGNOSIS — B078 Other viral warts: Secondary | ICD-10-CM | POA: Diagnosis not present

## 2021-09-22 DIAGNOSIS — R262 Difficulty in walking, not elsewhere classified: Secondary | ICD-10-CM

## 2021-09-22 DIAGNOSIS — R278 Other lack of coordination: Secondary | ICD-10-CM

## 2021-09-22 DIAGNOSIS — L821 Other seborrheic keratosis: Secondary | ICD-10-CM | POA: Diagnosis not present

## 2021-09-22 DIAGNOSIS — L400 Psoriasis vulgaris: Secondary | ICD-10-CM | POA: Diagnosis not present

## 2021-09-22 DIAGNOSIS — L853 Xerosis cutis: Secondary | ICD-10-CM | POA: Diagnosis not present

## 2021-09-22 DIAGNOSIS — D225 Melanocytic nevi of trunk: Secondary | ICD-10-CM | POA: Diagnosis not present

## 2021-09-22 NOTE — Telephone Encounter (Signed)
Patient request this message sent to sleep coordinator

## 2021-09-22 NOTE — Therapy (Signed)
[facility]. [place], [place], [address] Phone: [phone]   Fax:  [phone]  Physical Therapy Treatment  Patient Details  Name: [name] [name] MRN: [id number] Date of Birth: 10-21-1945 Referring Provider (PT): [name] [name]   Encounter Date: 09/22/2021   PT End of Session - 09/22/21 1616     Visit Number 69    Date for PT Re-Evaluation 10/06/21    Authorization Type MCR and BCBS    Progress Note Due on Visit 68    PT Start Time 1532    PT Stop Time 1610    PT Time Calculation (min) 38 min    Activity Tolerance Patient tolerated treatment well    Behavior During Therapy [name] [name] for tasks assessed/performed             Past Medical History:  Diagnosis Date   Arthritis    Closed fracture of fifth metatarsal bone 05/13/2018   Deep vein thrombosis ([place])    right calf - 05/2012    Diabetes mellitus without complication (HCC)    diet controlled    GERD (gastroesophageal reflux disease)    Hyperlipidemia    Hypertension    Ejection fraction =>55% Left ventricular systolic function is normal. Left ventricular wall motion is normal     Lymphocytic colitis    Neuropathy    diabetic - in bilateral feet    Pityriasis lichenoides chronica    Sleep apnea    bipap    Past Surgical History:  Procedure Laterality Date   DILATION AND CURETTAGE OF UTERUS     EYE SURGERY  07/2020   HAMMER TOE SURGERY     right hand surgery      due to blood infection    torn meniscus repair      right knee    TOTAL HIP ARTHROPLASTY  09/13/2012   Procedure: TOTAL HIP ARTHROPLASTY ANTERIOR APPROACH;  Surgeon: Mauri Pole, MD;  Location: [facility];  Service: Orthopedics;  Laterality: Right;    There were no vitals filed for this visit.   Subjective Assessment - 09/22/21 1534     Subjective I got lost on the way here, I don't really know what happened but I thought I was going to take the exit to [address] and I don't know  where I ended up, didn't use my GPS. I also had a fall at my doctor's office. Nothing new going on, I'm awfully "off", I don't feel very alert and like I need a nap- had to get up early and go to annual skin check.    Pertinent History Covid 2020, B PN, swallowing issues    Patient Stated Goals Walk without AD for longer distances, improve balance    Currently in Pain? Yes    Pain Score 3     Pain Location Knee    Pain Orientation Right    Pain Descriptors / Indicators Sore    Pain Type Chronic pain                [facility] PT Assessment - 09/22/21 0001       Observation/Other Assessments   Observations BP 138/83, HR 84, SpO2 97% RA sitting at edge of mat table; 127/92 after sitting at edge of mat table                           [facility] Adult PT Treatment/Exercise - 09/22/21 0001  Lumbar Exercises: Supine   Bent Knee Raise 15 reps    Bent Knee Raise Limitations with posterior pelvic tilt and TA set    Dead Bug 10 reps    Dead Bug Limitations TA set    Bridge 20 reps;3 seconds    Bridge Limitations controlled lower    Large Ball Oblique Isometric Limitations abs: posterior pelvic tilt with TA set and supine hip ABD 1x10      Knee/Hip Exercises: Standing   Heel Raises Both;1 set;15 reps    Other Standing Knee Exercises attempted sit to stand holds 1x2, unable to perform more due to R ankle giving way/R ankle pain      Knee/Hip Exercises: Seated   Other Seated Knee/Hip Exercises green TB anlke DF 1x15, ankle inversion/eversions 1x10 B green TB                     PT Education - 09/22/21 1616     Education Details exercise form/purpose    Person(s) Educated Patient    Methods Explanation    Comprehension Verbalized understanding              PT Short Term Goals - 07/23/21 1514       PT SHORT TERM GOAL #1   Title I with basic HEP    Baseline 12/14- almost every day, have been practice walking too but weather has made it hard     Time 3    Period Weeks    Status On-going               PT Long Term Goals - 09/18/21 1139       PT LONG TERM GOAL #1   Title Improve motor strength in BLE to 4+/5, with good endurance to tolerate at least 2 x 20 reps of all strenht exercises.    Status Partially Met      PT LONG TERM GOAL #2   Title Improve 5x sit to stand time to 12 seconds from normal test height surface, in order to improve ability to rise from restaraunt chairs iwthout use of armrests.    Status Partially Met                   Plan - 09/22/21 1616     Clinical Impression Statement Ms. Widener arrives today doing OK, feeling anxious after getting lost on the way here to the clinic, also tells me she had a fall at her doctors office the other week but not sure what happened/how she fell. Feeling very sleepy and just not herself- VSS on RA today however. Reviewed orthostatic BP precautions, then continued with general core strength as well, also requested we work on some ankle strengthening today too. Will need progress note/measures next visit.    Personal Factors and Comorbidities Age;Behavior Pattern;Fitness;Time since onset of injury/illness/exacerbation;Past/Current Experience;Comorbidity 1;Comorbidity 2    Comorbidities Covid, Foot wound    Examination-Activity Limitations Sit;Transfers;Bend;Squat;Stairs;Carry;Stand;Toileting    Examination-Participation Restrictions Cleaning;Community Activity;Interpersonal Relationship;Laundry;Other    Stability/Clinical Decision Making Evolving/Moderate complexity    Clinical Decision Making Moderate    Rehab Potential Good    PT Frequency 2x / week    PT Duration 6 weeks    PT Treatment/Interventions ADLs/Self Care Home Management;Cryotherapy;Electrical Stimulation;Moist Heat;DME Lexicographer;Therapeutic exercise;Balance training;Neuromuscular re-education;Patient/family education;Dry needling    PT Next Visit Plan needs progress note    PT Home  Exercise Plan [id number]    Consulted and Agree with Plan of Care Patient  [name]:  Abnormal gait, Decreased balance, Decreased endurance, Decreased mobility, Difficulty walking, Impaired sensation, Improper body mechanics, Cardiopulmonary status limiting activity, Decreased activity tolerance, Decreased strength, Pain, Postural dysfunction  Visit Diagnosis: Difficulty in walking, not elsewhere classified  Muscle weakness (generalized)  Other lack of coordination     Problem List Patient Active Problem List   Diagnosis Date Noted   Chronic hiccups 05/01/2021   Memory loss 05/01/2021   History of COVID-19 05/01/2021   Olfactory impairment 05/01/2021   Senile purpura ([place]) 11/15/2020   Difficulty with speech 07/23/2020   SOB (shortness of breath) 07/23/2020   Dyslipidemia 16/05/9603   Pityriasis lichenoides chronica 09/18/2019   Insomnia 08/07/2019   Anxiety 08/07/2019   Psoriatic arthritis ([place]) -Dr. Trudie Reed 08/07/2019   Gout 08/07/2019   Recurrent UTI 08/07/2019   Charcot's joint of left foot 08/07/2019   Depression 08/07/2019   Pneumonia due to COVID-19 virus 07/09/2019   Physical deconditioning 07/08/2019   COVID-19 07/07/2019   Diabetic peripheral neuropathy ([place]) 04/10/2019   Pain in left knee 10/13/2018   Ischemic colitis ([place]) 10/06/2018   Urinary incontinence 07/21/2018   Diabetic foot ulcer ([place]) 07/13/2018   DM2 (diabetes mellitus, type 2) ([place]) 05/02/2018   Psoriasis 05/02/2018   Osteoarthritis of knee 10/18/2017   Right bundle branch block 05/20/2014   OSA (obstructive sleep apnea), BiPAP 03/12/2013   Essential hypertension 03/12/2013   Obesity 09/14/2012   Hyponatremia 09/14/2012   S/P right THA, AA 09/13/2012   Deep venous thrombosis of lower extremity ([place]) 08/10/2010   Lymphocytic-plasmacytic colitis 08/11/2007   Ann Lions  PT, DPT, PN2   Supplemental Physical Therapist [facility]. [place], [place], [address] Phone: [phone]   Fax:  [phone]  Name: [name] [name] MRN: [id number] Date of Birth: 1946/07/07

## 2021-09-22 NOTE — Telephone Encounter (Signed)
Phoned patient to verbally clarify what exactly she is requesting. There seems to be some conflicting information between what she is telling Olivia Mackie at [facility] and what she is actually wanting to do. I really don't think that she understands the difference between the two machines. She states that she isn't going to pay for a new machine. I informed her that I do not know how [employer] will handle her situation since not only is there a recall on her machine, but she is due for a new machine since hers is 5+ years in age and has "died." They may send it through as a 5 year replacement and she will still have to pay her portion. If she switches to the [employer] machine that Dr Claiborne Billings has ordered she will have to pay some $$. How much will depend on her insurance plan. Patient again states that she would like for me to send her BIPAP setting to [facility]. She will call me when she gets home with the phillips contact information for me to send her settings to " because they're not going to do a damn thing  until they get the settings." Once the contact information has been received from her I will send them what they need in order to send out her new machine.

## 2021-09-24 ENCOUNTER — Other Ambulatory Visit: Payer: Self-pay

## 2021-09-24 ENCOUNTER — Encounter: Payer: Self-pay | Admitting: Physical Therapy

## 2021-09-24 ENCOUNTER — Ambulatory Visit: Payer: Medicare Other | Admitting: Physical Therapy

## 2021-09-24 DIAGNOSIS — R262 Difficulty in walking, not elsewhere classified: Secondary | ICD-10-CM | POA: Diagnosis not present

## 2021-09-24 DIAGNOSIS — R278 Other lack of coordination: Secondary | ICD-10-CM

## 2021-09-24 DIAGNOSIS — M6281 Muscle weakness (generalized): Secondary | ICD-10-CM

## 2021-09-24 NOTE — Therapy (Signed)
[facility]. [place], [place], [address] Phone: [phone]   Fax:  [phone] Progress Note Reporting Period 08/20/21 to 09/24/21 for visits 21-30  See note below for Objective Data and Assessment of Progress/Goals.     Physical Therapy Treatment  Patient Details  Name: [name] [name] MRN: [id number] Date of Birth: 1945-10-03 Referring Provider (PT): Lazaro Arms   Encounter Date: 09/24/2021   PT End of Session - 09/24/21 1136     Visit Number 30    Date for PT Re-Evaluation 10/06/21    Authorization Type MCR and BCBS    PT Start Time 1013    PT Stop Time 1100    PT Time Calculation (min) 47 min    Activity Tolerance Patient tolerated treatment well    Behavior During Therapy [facility] for tasks assessed/performed             Past Medical History:  Diagnosis Date   Arthritis    Closed fracture of fifth metatarsal bone 05/13/2018   Deep vein thrombosis ([place])    right calf - 05/2012    Diabetes mellitus without complication (HCC)    diet controlled    GERD (gastroesophageal reflux disease)    Hyperlipidemia    Hypertension    Ejection fraction =>55% Left ventricular systolic function is normal. Left ventricular wall motion is normal     Lymphocytic colitis    Neuropathy    diabetic - in bilateral feet    Pityriasis lichenoides chronica    Sleep apnea    bipap    Past Surgical History:  Procedure Laterality Date   DILATION AND CURETTAGE OF UTERUS     EYE SURGERY  07/2020   HAMMER TOE SURGERY     right hand surgery      due to blood infection    torn meniscus repair      right knee    TOTAL HIP ARTHROPLASTY  09/13/2012   Procedure: TOTAL HIP ARTHROPLASTY ANTERIOR APPROACH;  Surgeon: Mauri Pole, MD;  Location: [facility];  Service: Orthopedics;  Laterality: Right;    There were no vitals filed for this visit.   Subjective Assessment - 09/24/21 1029     Subjective Patient reports that she is  still recovering from a fall, reports that she is not in any pain    Currently in Pain? Yes    Pain Score 2     Pain Location Knee    Pain Orientation Right    Pain Descriptors / Indicators Sore    Aggravating Factors  the fall                               [facility] Adult PT Treatment/Exercise - 09/24/21 0001       Ambulation/Gait   Gait Comments from and back to her car with a SPC and light HHA as she seemed to be a little more unsteady      Lumbar Exercises: Aerobic   Nustep level 5 x 6 minues      Lumbar Exercises: Supine   Bent Knee Raise 15 reps    Bent Knee Raise Limitations with posterior pelvic tilt and TA set    Bridge 20 reps;3 seconds    Bridge Limitations controlled lower    Other Supine Lumbar Exercises feet on ball K2C, trunk rotation, small bridges, isometric abs      Knee/Hip Exercises: Machines for  Strengthening   Cybex Knee Extension 5# 3x10    Cybex Knee Flexion 25# 3x10      Knee/Hip Exercises: Standing   Hip Flexion Both;2 sets;10 reps    Hip Abduction Both;2 sets;10 reps    Hip Extension Both;2 sets;10 reps                       PT Short Term Goals - 07/23/21 1514       PT SHORT TERM GOAL #1   Title I with basic HEP    Baseline 12/14- almost every day, have been practice walking too but weather has made it hard    Time 3    Period Weeks    Status On-going               PT Long Term Goals - 09/24/21 1139       PT LONG TERM GOAL #1   Title Improve motor strength in BLE to 4+/5, with good endurance to tolerate at least 2 x 20 reps of all strenht exercises.    Status Partially Met      PT LONG TERM GOAL #2   Title Improve 5x sit to stand time to 12 seconds from normal test height surface, in order to improve ability to rise from restaraunt chairs iwthout use of armrests.    Status Partially Met      PT LONG TERM GOAL #3   Title Patient will ambulate functionally x at least 500', on level and unlevel  surfaces with LRAD, MI.    Status On-going      PT LONG TERM GOAL #4   Title Patient will tolerate sitting/ standing activities during 45 minute treatment, with maximum of 4 rest breaks, with no C/O back pain or fatigue.    Status Partially Met                   Plan - 09/24/21 1136     Clinical Impression Statement Patient reports that she did not sleep well last night.  She reports that she feels like she is still recovering from the fall she took at her MD office.  She has a good size bruise medial right knee.  Tolerated exercises well but did seem to be a little more unsteady on her feet, needed some HHA, walked her in from car and out to the car    PT Next Visit Plan continue to progress as she had a set back with the fall    Consulted and Agree with Plan of Care Patient             [name]:  Abnormal gait, Decreased balance, Decreased endurance, Decreased mobility, Difficulty walking, Impaired sensation, Improper body mechanics, Cardiopulmonary status limiting activity, Decreased activity tolerance, Decreased strength, Pain, Postural dysfunction  Visit Diagnosis: Difficulty in walking, not elsewhere classified  Muscle weakness (generalized)  Other lack of coordination     Problem List Patient Active Problem List   Diagnosis Date Noted   Chronic hiccups 05/01/2021   Memory loss 05/01/2021   History of COVID-19 05/01/2021   Olfactory impairment 05/01/2021   Senile purpura (HCC) 11/15/2020   Difficulty with speech 07/23/2020   SOB (shortness of breath) 07/23/2020   Dyslipidemia 11/12/2019   Pityriasis lichenoides chronica 09/18/2019   Insomnia 08/07/2019   Anxiety 08/07/2019   Psoriatic arthritis (HCC) -Dr. Nickola Major 08/07/2019  Gout 08/07/2019   Recurrent UTI 08/07/2019   Charcot's joint of left foot 08/07/2019   Depression 08/07/2019   Pneumonia  due to COVID-19 virus 07/09/2019   Physical deconditioning 07/08/2019   COVID-19 07/07/2019   Diabetic peripheral neuropathy ([place]) 04/10/2019   Pain in left knee 10/13/2018   Ischemic colitis ([place]) 10/06/2018   Urinary incontinence 07/21/2018   Diabetic foot ulcer ([place]) 07/13/2018   DM2 (diabetes mellitus, type 2) ([place]) 05/02/2018   Psoriasis 05/02/2018   Osteoarthritis of knee 10/18/2017   Right bundle branch block 05/20/2014   OSA (obstructive sleep apnea), BiPAP 03/12/2013   Essential hypertension 03/12/2013   Obesity 09/14/2012   Hyponatremia 09/14/2012   S/P right THA, AA 09/13/2012   Deep venous thrombosis of lower extremity ([place]) 08/10/2010   Lymphocytic-plasmacytic colitis 08/11/2007    Sumner Boast, PT 09/24/2021, 11:41 AM  [facility]. [place], [place], [address] Phone: [phone]   Fax:  [phone]  Name: JOWANDA HEEG MRN: [id number] Date of Birth: Sep 14, 1945

## 2021-09-25 DIAGNOSIS — L4059 Other psoriatic arthropathy: Secondary | ICD-10-CM | POA: Diagnosis not present

## 2021-09-26 ENCOUNTER — Ambulatory Visit: Payer: Medicare Other | Admitting: Podiatry

## 2021-09-29 ENCOUNTER — Ambulatory Visit: Payer: Medicare Other | Admitting: Physical Therapy

## 2021-09-29 ENCOUNTER — Encounter: Payer: Self-pay | Admitting: Physical Therapy

## 2021-09-29 ENCOUNTER — Other Ambulatory Visit: Payer: Self-pay

## 2021-09-29 DIAGNOSIS — M6281 Muscle weakness (generalized): Secondary | ICD-10-CM | POA: Diagnosis not present

## 2021-09-29 DIAGNOSIS — R262 Difficulty in walking, not elsewhere classified: Secondary | ICD-10-CM | POA: Diagnosis not present

## 2021-09-29 DIAGNOSIS — R278 Other lack of coordination: Secondary | ICD-10-CM | POA: Diagnosis not present

## 2021-09-29 NOTE — Therapy (Signed)
[facility]. [place], [place], [address] Phone: [phone]   Fax:  [phone]  Physical Therapy Treatment  Patient Details  Name: [name] [name] MRN: [id number] Date of Birth: [birth year] Referring Provider (PT): [name] [name]   Encounter Date: 09/29/2021   PT End of Session - 09/29/21 1133     Visit Number 65    Authorization Type MCR and BCBS    Progress Note Due on Visit 5    PT Start Time 1024   arrived a few minutes late   PT Stop Time 1057    PT Time Calculation (min) 33 min    Activity Tolerance Patient tolerated treatment well    Behavior During Therapy [name] [name] for tasks assessed/performed             Past Medical History:  Diagnosis Date   Arthritis    Closed fracture of fifth metatarsal bone 05/13/2018   Deep vein thrombosis ([place])    right calf - 05/2012    Diabetes mellitus without complication ([place])    diet controlled    GERD (gastroesophageal reflux disease)    Hyperlipidemia    Hypertension    Ejection fraction =>55% Left ventricular systolic function is normal. Left ventricular wall motion is normal     Lymphocytic colitis    Neuropathy    diabetic - in bilateral feet    Pityriasis lichenoides chronica    Sleep apnea    bipap    Past Surgical History:  Procedure Laterality Date   DILATION AND CURETTAGE OF UTERUS     EYE SURGERY  07/2020   HAMMER TOE SURGERY     right hand surgery      due to blood infection    torn meniscus repair      right knee    TOTAL HIP ARTHROPLASTY  09/13/2012   Procedure: TOTAL HIP ARTHROPLASTY ANTERIOR APPROACH;  Surgeon: [name] Pole, MD;  Location: [facility];  Service: Orthopedics;  Laterality: Right;    There were no vitals filed for this visit.   Subjective Assessment - 09/29/21 1025     Subjective My knee is still bothering me after the fall, I feel like the core exercises we've been working on have really helped. I'm wonder if my left foot got  caught when I fell and made my trip.    Pertinent History Covid 2020, B PN, swallowing issues    Patient Stated Goals Walk without AD for longer distances, improve balance    Currently in Pain? Yes    Pain Score 2     Pain Location Knee    Pain Orientation Right    Pain Descriptors / Indicators Sore    Pain Type Chronic pain                               [facility] Adult PT Treatment/Exercise - 09/29/21 0001       Lumbar Exercises: Standing   Heel Raises 10 reps    Other Standing Lumbar Exercises forward step ups 1x10 4 inch step    Other Standing Lumbar Exercises sit to stand with slow eccentric lower 1x6      Lumbar Exercises: Supine   Straight Leg Raise 10 reps    Straight Leg Raises Limitations with TA set/posterior pelvic tilt, low    Other Supine Lumbar Exercises bridge with clam shell 1x15 red TB; bridge with marches 1x10 B  red TB above knees                 Balance Exercises - 09/29/21 0001       Balance Exercises: Standing   Standing Eyes Closed Narrow base of support (BOS);20 secs;2 reps    Tandem Stance Eyes open;3 reps;20 secs                PT Education - 09/29/21 1132     Education Details exericse form/purpose, need to get more up on feet to help her recover from set back with her fall    Person(s) Educated Patient    Methods Explanation    Comprehension Verbalized understanding              PT Short Term Goals - 07/23/21 1514       PT SHORT TERM GOAL #1   Title I with basic HEP    Baseline 12/14- almost every day, have been practice walking too but weather has made it hard    Time 3    Period Weeks    Status On-going               PT Long Term Goals - 09/24/21 1139       PT LONG TERM GOAL #1   Title Improve motor strength in BLE to 4+/5, with good endurance to tolerate at least 2 x 20 reps of all strenht exercises.    Status Partially Met      PT LONG TERM GOAL #2   Title Improve 5x sit to stand time  to 12 seconds from normal test height surface, in order to improve ability to rise from restaraunt chairs iwthout use of armrests.    Status Partially Met      PT LONG TERM GOAL #3   Title Patient will ambulate functionally x at least 500', on level and unlevel surfaces with LRAD, MI.    Status On-going      PT LONG TERM GOAL #4   Title Patient will tolerate sitting/ standing activities during 45 minute treatment, with maximum of 4 rest breaks, with no C/O back pain or fatigue.    Status Partially Met                   Plan - 09/29/21 1133     Clinical Impression Statement [name] arrives a few minutes late today; requests we keep spending some time working on core strength, able to progress this a bit today and then got up on her feet for some more advanced CKC strength and balance. She did definitely have a set back from her fall, I think it would be good to spend a little more time on balance and fall risk education moving forward. No dizziness noted today.    Personal Factors and Comorbidities Age;Behavior Pattern;Fitness;Time since onset of injury/illness/exacerbation;Past/Current Experience;Comorbidity 1;Comorbidity 2    Comorbidities Covid, Foot wound    Examination-Activity Limitations Sit;Transfers;Bend;Squat;Stairs;Carry;Stand;Toileting    Examination-Participation Restrictions Cleaning;Community Activity;Interpersonal Relationship;Laundry;Other    Stability/Clinical Decision Making Evolving/Moderate complexity    Clinical Decision Making Moderate    Rehab Potential Good    PT Frequency 2x / week    PT Duration 6 weeks    PT Treatment/Interventions ADLs/Self Care Home Management;Cryotherapy;Electrical Stimulation;Moist Heat;DME Lexicographer;Therapeutic exercise;Balance training;Neuromuscular re-education;Patient/family education;Dry needling    PT Next Visit Plan continue to progress as she had a set back with the fall; talk about fall education    PT Home  Exercise  Plan [id number]    Consulted and Agree with Plan of Care Patient             [name]:  Abnormal gait, Decreased balance, Decreased endurance, Decreased mobility, Difficulty walking, Impaired sensation, Improper body mechanics, Cardiopulmonary status limiting activity, Decreased activity tolerance, Decreased strength, Pain, Postural dysfunction  Visit Diagnosis: Difficulty in walking, not elsewhere classified  Muscle weakness (generalized)  Other lack of coordination     Problem List Patient Active Problem List   Diagnosis Date Noted   Chronic hiccups 05/01/2021   Memory loss 05/01/2021   History of COVID-19 05/01/2021   Olfactory impairment 05/01/2021   Senile purpura ([place]) 11/15/2020   Difficulty with speech 07/23/2020   SOB (shortness of breath) 07/23/2020   Dyslipidemia 28/63/8177   Pityriasis lichenoides chronica 09/18/2019   Insomnia 08/07/2019   Anxiety 08/07/2019   Psoriatic arthritis ([place]) -Dr. Trudie Reed 08/07/2019   Gout 08/07/2019   Recurrent UTI 08/07/2019   Charcot's joint of left foot 08/07/2019   Depression 08/07/2019   Pneumonia due to COVID-19 virus 07/09/2019   Physical deconditioning 07/08/2019   COVID-19 07/07/2019   Diabetic peripheral neuropathy ([place]) 04/10/2019   Pain in left knee 10/13/2018   Ischemic colitis ([place]) 10/06/2018   Urinary incontinence 07/21/2018   Diabetic foot ulcer ([place]) 07/13/2018   DM2 (diabetes mellitus, type 2) ([place]) 05/02/2018   Psoriasis 05/02/2018   Osteoarthritis of knee 10/18/2017   Right bundle branch block 05/20/2014   OSA (obstructive sleep apnea), BiPAP 03/12/2013   Essential hypertension 03/12/2013   Obesity 09/14/2012   Hyponatremia 09/14/2012   S/P right THA, AA 09/13/2012   Deep venous thrombosis of lower extremity ([place]) 08/10/2010   Lymphocytic-plasmacytic colitis 08/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 [place], [place], [address] Phone: [phone]   Fax:  [phone]  Name: [name] [name] MRN: [id number] Date of Birth: 1946-04-19

## 2021-09-30 NOTE — Telephone Encounter (Signed)
Patient notified that I have spoken with the [facility] and provided them verbally with the information they needed to replace her BIPAP machine. Paper RX request also received from them. It was completed and signed by Dr Claiborne Billings and faxed back to them. Confirmation fax was received showing the fax was received by them.

## 2021-10-01 ENCOUNTER — Other Ambulatory Visit: Payer: Self-pay

## 2021-10-01 ENCOUNTER — Ambulatory Visit: Payer: Medicare Other | Admitting: Physical Therapy

## 2021-10-01 ENCOUNTER — Encounter: Payer: Self-pay | Admitting: Physical Therapy

## 2021-10-01 DIAGNOSIS — M6281 Muscle weakness (generalized): Secondary | ICD-10-CM

## 2021-10-01 DIAGNOSIS — R262 Difficulty in walking, not elsewhere classified: Secondary | ICD-10-CM | POA: Diagnosis not present

## 2021-10-01 DIAGNOSIS — R278 Other lack of coordination: Secondary | ICD-10-CM | POA: Diagnosis not present

## 2021-10-01 NOTE — Therapy (Signed)
[name]. [name], [place], [address] Phone: [phone]   Fax:  [phone]  Physical Therapy Treatment  Patient Details  Name: [name] [name] MRN: [id number] Date of Birth: [birth year] Referring Provider (PT): [name] [name]   Encounter Date: 10/01/2021   PT End of Session - 10/01/21 1105     Visit Number 22    Date for PT Re-Evaluation 10/06/21    Authorization Type MCR and BCBS    PT Start Time 1020    PT Stop Time 1100    PT Time Calculation (min) 40 min    Activity Tolerance Patient tolerated treatment well    Behavior During Therapy [facility] for tasks assessed/performed             Past Medical History:  Diagnosis Date   Arthritis    Closed fracture of fifth metatarsal bone 05/13/2018   Deep vein thrombosis ([place])    right calf - 05/2012    Diabetes mellitus without complication (HCC)    diet controlled    GERD (gastroesophageal reflux disease)    Hyperlipidemia    Hypertension    Ejection fraction =>55% Left ventricular systolic function is normal. Left ventricular wall motion is normal     Lymphocytic colitis    Neuropathy    diabetic - in bilateral feet    Pityriasis lichenoides chronica    Sleep apnea    bipap    Past Surgical History:  Procedure Laterality Date   DILATION AND CURETTAGE OF UTERUS     EYE SURGERY  07/2020   HAMMER TOE SURGERY     right hand surgery      due to blood infection    torn meniscus repair      right knee    TOTAL HIP ARTHROPLASTY  09/13/2012   Procedure: TOTAL HIP ARTHROPLASTY ANTERIOR APPROACH;  Surgeon: [name] Pole, MD;  Location: [facility];  Service: Orthopedics;  Laterality: Right;    There were no vitals filed for this visit.   Subjective Assessment - 10/01/21 1027     Subjective Reports still having some knee pain.  No more falls    Currently in Pain? Yes    Pain Score 3     Pain Location Knee    Pain Orientation Right    Aggravating Factors  the  fall irritated it                               [facility] Adult PT Treatment/Exercise - 10/01/21 0001       Ambulation/Gait   Gait Comments gait 130 feet and then needed to rest      Lumbar Exercises: Standing   Other Standing Lumbar Exercises red tband rows and extensions 2x10      Lumbar Exercises: Supine   Straight Leg Raise 10 reps    Other Supine Lumbar Exercises posterior pelvic tilt x15 with 3 second holds; marches with posterior pelvic tilt 1x15B, feet on ball K2C, trunk rotaiton, small bridges and isometric abs    Other Supine Lumbar Exercises clamshells 1x20      Knee/Hip Exercises: Standing   Hip Abduction Both;2 sets;10 reps    Hip Extension Both;2 sets;10 reps      Knee/Hip Exercises: Seated   Long Arc Quad Both;2 sets;10 reps    Long Arc Quad Weight 3 lbs.    Ball Squeeze 20    Other Seated Knee/Hip  Exercises red tband hip IR with ball b/n knees    Marching 1 set;20 reps    Marching Weights 3 lbs.    Hamstring Curl Both;2 sets;10 reps    Hamstring Limitations Green band                       PT Short Term Goals - 07/23/21 1514       PT SHORT TERM GOAL #1   Title I with basic HEP    Baseline 12/14- almost every day, have been practice walking too but weather has made it hard    Time 3    Period Weeks    Status On-going               PT Long Term Goals - 10/01/21 1153       PT LONG TERM GOAL #1   Title Improve motor strength in BLE to 4+/5, with good endurance to tolerate at least 2 x 20 reps of all strenht exercises.    Status Partially Met      PT LONG TERM GOAL #2   Title Improve 5x sit to stand time to 12 seconds from normal test height surface, in order to improve ability to rise from restaraunt chairs iwthout use of armrests.    Status Partially Met                   Plan - 10/01/21 1105     Clinical Impression Statement Patient with some knee pain.  She was able to tolerate the exercises some  cues for core activation, with walking she really was limited to 120 feet due to SOB.  She reports that most of the time she is feeling that her ankles feel stronger and she is moving better, she reports that she just feels more fatigued today.    PT Next Visit Plan continue to progress as she had a set back with the fall; talk about fall education    Consulted and Agree with Plan of Care Patient             [name]:  Abnormal gait, Decreased balance, Decreased endurance, Decreased mobility, Difficulty walking, Impaired sensation, Improper body mechanics, Cardiopulmonary status limiting activity, Decreased activity tolerance, Decreased strength, Pain, Postural dysfunction  Visit Diagnosis: Difficulty in walking, not elsewhere classified  Muscle weakness (generalized)     Problem List Patient Active Problem List   Diagnosis Date Noted   Chronic hiccups 05/01/2021   Memory loss 05/01/2021   History of COVID-19 05/01/2021   Olfactory impairment 05/01/2021   Senile purpura ([place]) 11/15/2020   Difficulty with speech 07/23/2020   SOB (shortness of breath) 07/23/2020   Dyslipidemia 27/74/1287   Pityriasis lichenoides chronica 09/18/2019   Insomnia 08/07/2019   Anxiety 08/07/2019   Psoriatic arthritis ([place]) -Dr. Trudie Reed 08/07/2019   Gout 08/07/2019   Recurrent UTI 08/07/2019   Charcot's joint of left foot 08/07/2019   Depression 08/07/2019   Pneumonia due to COVID-19 virus 07/09/2019   Physical deconditioning 07/08/2019   COVID-19 07/07/2019   Diabetic peripheral neuropathy ([place]) 04/10/2019   Pain in left knee 10/13/2018   Ischemic colitis ([place]) 10/06/2018   Urinary incontinence 07/21/2018   Diabetic foot ulcer ([place]) 07/13/2018   DM2 (diabetes mellitus, type 2) ([place]) 05/02/2018   Psoriasis 05/02/2018   Osteoarthritis of knee 10/18/2017   Right bundle branch block  05/20/2014  OSA (obstructive sleep apnea), BiPAP 03/12/2013   Essential hypertension 03/12/2013   Obesity 09/14/2012   Hyponatremia 09/14/2012   S/P right THA, AA 09/13/2012   Deep venous thrombosis of lower extremity ([place]) 08/10/2010   Lymphocytic-plasmacytic colitis 08/11/2007    Sumner Boast, PT 10/01/2021, 11:54 AM  [place] [place], [place], [address] Phone: [phone]   Fax:  [phone]  Name: [name] [name] MRN: [id number] Date of Birth: [birth year]

## 2021-10-03 ENCOUNTER — Other Ambulatory Visit: Payer: Self-pay | Admitting: Internal Medicine

## 2021-10-06 ENCOUNTER — Encounter: Payer: Self-pay | Admitting: Physical Therapy

## 2021-10-06 ENCOUNTER — Other Ambulatory Visit: Payer: Self-pay

## 2021-10-06 ENCOUNTER — Ambulatory Visit: Payer: Medicare Other | Admitting: Physical Therapy

## 2021-10-06 DIAGNOSIS — M6281 Muscle weakness (generalized): Secondary | ICD-10-CM | POA: Diagnosis not present

## 2021-10-06 DIAGNOSIS — R278 Other lack of coordination: Secondary | ICD-10-CM

## 2021-10-06 DIAGNOSIS — R262 Difficulty in walking, not elsewhere classified: Secondary | ICD-10-CM | POA: Diagnosis not present

## 2021-10-06 NOTE — Therapy (Signed)
[facility] [address] [place], [place], [address] Phone: [phone]   Fax:  [phone]  Physical Therapy Treatment  Patient Details  Name: [name] [name] MRN: [id number] Date of Birth: [birth year] Referring Provider (PT): Angus Seller   Encounter Date: 10/06/2021   PT End of Session - 10/06/21 1059     Visit Number 33    Date for PT Re-Evaluation 11/08/21    Authorization Type MCR and BCBS    PT Start Time 1020    PT Stop Time 1100    PT Time Calculation (min) 40 min    Activity Tolerance Patient tolerated treatment well    Behavior During Therapy [facility] for tasks assessed/performed             Past Medical History:  Diagnosis Date   Arthritis    Closed fracture of fifth metatarsal bone 05/13/2018   Deep vein thrombosis (HCC)    right calf - 05/2012    Diabetes mellitus without complication (HCC)    diet controlled    GERD (gastroesophageal reflux disease)    Hyperlipidemia    Hypertension    Ejection fraction =>55% Left ventricular systolic function is normal. Left ventricular wall motion is normal     Lymphocytic colitis    Neuropathy    diabetic - in bilateral feet    Pityriasis lichenoides chronica    Sleep apnea    bipap    Past Surgical History:  Procedure Laterality Date   DILATION AND CURETTAGE OF UTERUS     EYE SURGERY  07/2020   HAMMER TOE SURGERY     right hand surgery      due to blood infection    torn meniscus repair      right knee    TOTAL HIP ARTHROPLASTY  09/13/2012   Procedure: TOTAL HIP ARTHROPLASTY ANTERIOR APPROACH;  Surgeon: Shelda Pal, MD;  Location[facility];  Service: Orthopedics;  Laterality: Right;    There were no vitals filed for this visit.   Subjective Assessment - 10/06/21 1023     Subjective Knee is hurting from where I fell    Currently in Pain? Yes    Pain Score 3     Pain Location Knee    Pain Orientation Right    Aggravating Factors  walking    Pain  Relieving Factors rest, ice, tylenol                               [facility] Adult PT Treatment/Exercise - 10/06/21 0001       Ambulation/Gait   Gait Comments 200' x 2 with SPC and SBA      Lumbar Exercises: Supine   Clam 20 reps    Clam Limitations red tband    Bridge with [employer] 20 reps    Straight Leg Raise 20 reps;1 second    Straight Leg Raises Limitations with TA set/posterior pelvic tilt, low      Knee/Hip Exercises: Aerobic   Nustep L4 x 6 min      Knee/Hip Exercises: Machines for Strengthening   Cybex Knee Flexion 20# 3x10    Other Machine 5# straight arm pulls 2x10                       PT Short Term Goals - 07/23/21 1514       PT SHORT TERM GOAL #1  Title I with basic HEP    Baseline 12/14- almost every day, have been practice walking too but weather has made it hard    Time 3    Period Weeks    Status On-going               PT Long Term Goals - 10/06/21 1103       PT LONG TERM GOAL #1   Title Improve motor strength in BLE to 4+/5, with good endurance to tolerate at least 2 x 20 reps of all strenht exercises.    Status Partially Met      PT LONG TERM GOAL #2   Title Improve 5x sit to stand time to 12 seconds from normal test height surface, in order to improve ability to rise from restaraunt chairs iwthout use of armrests.    Status Partially Met      PT LONG TERM GOAL #3   Title Patient will ambulate functionally x at least 500', on level and unlevel surfaces with LRAD, MI.    Status On-going      PT LONG TERM GOAL #4   Title Patient will tolerate sitting/ standing activities during 45 minute treatment, with maximum of 4 rest breaks, with no C/O back pain or fatigue.    Status Partially Met      PT LONG TERM GOAL #5   Title Up and down at least 5 steps with B rails, MI    Status Partially Met      PT LONG TERM GOAL #6   Title Will improve Berg score to at least 38 in order to show improved functional  balance skills/reduced fall risk    Status Partially Met                   Plan - 10/06/21 1100     Clinical Impression Statement Overall patient is improving, she did have a fall recently that set her back, she did have some increase in right knee pain and will see MD next week.  Last week she had some difficulty walking 120 feet due to shortness of breath, able to go 200 feet today with less shortness of breath, she does have some knee pain.  She is doing better with activating her core and strength, still with balance issues.    PT Duration 8 weeks    PT Treatment/Interventions ADLs/Self Care Home Management;Cryotherapy;Electrical Stimulation;Moist Heat;DME Lexicographer;Therapeutic exercise;Balance training;Neuromuscular re-education;Patient/family education;Dry needling    PT Next Visit Plan continue to progress as she had a set back with the fall; talk about fall education    Consulted and Agree with Plan of Care Patient             [name]:  Abnormal gait, Decreased balance, Decreased endurance, Decreased mobility, Difficulty walking, Impaired sensation, Improper body mechanics, Cardiopulmonary status limiting activity, Decreased activity tolerance, Decreased strength, Pain, Postural dysfunction  Visit Diagnosis: Difficulty in walking, not elsewhere classified - Plan: PT plan of care cert/re-cert  Muscle weakness (generalized) - Plan: PT plan of care cert/re-cert  Other lack of coordination - Plan: PT plan of care cert/re-cert     Problem List Patient Active Problem List   Diagnosis Date Noted   Chronic hiccups 05/01/2021   Memory loss 05/01/2021   History of COVID-19 05/01/2021   Olfactory impairment 05/01/2021   Senile purpura ([place]) 11/15/2020   Difficulty with speech 07/23/2020  SOB (shortness of breath) 07/23/2020   Dyslipidemia  42/35/3614   Pityriasis lichenoides chronica 09/18/2019   Insomnia 08/07/2019   Anxiety 08/07/2019   Psoriatic arthritis ([place]) -Dr. Trudie Reed 08/07/2019   Gout 08/07/2019   Recurrent UTI 08/07/2019   Charcot's joint of left foot 08/07/2019   Depression 08/07/2019   Pneumonia due to COVID-19 virus 07/09/2019   Physical deconditioning 07/08/2019   COVID-19 07/07/2019   Diabetic peripheral neuropathy ([place]) 04/10/2019   Pain in left knee 10/13/2018   Ischemic colitis ([place]) 10/06/2018   Urinary incontinence 07/21/2018   Diabetic foot ulcer ([place]) 07/13/2018   DM2 (diabetes mellitus, type 2) ([place]) 05/02/2018   Psoriasis 05/02/2018   Osteoarthritis of knee 10/18/2017   Right bundle branch block 05/20/2014   OSA (obstructive sleep apnea), BiPAP 03/12/2013   Essential hypertension 03/12/2013   Obesity 09/14/2012   Hyponatremia 09/14/2012   S/P right THA, AA 09/13/2012   Deep venous thrombosis of lower extremity ([place]) 08/10/2010   Lymphocytic-plasmacytic colitis 08/11/2007    Sumner Boast, PT 10/06/2021, 11:04 AM  [place]. [place], [place], [address] Phone: [phone]   Fax:  [phone]  Name: RIHANA KIDDY MRN: [id number] Date of Birth: 09/17/1945

## 2021-10-08 DIAGNOSIS — M170 Bilateral primary osteoarthritis of knee: Secondary | ICD-10-CM | POA: Diagnosis not present

## 2021-10-09 ENCOUNTER — Ambulatory Visit: Payer: Medicare Other | Admitting: Physical Therapy

## 2021-10-13 ENCOUNTER — Other Ambulatory Visit: Payer: Self-pay

## 2021-10-13 ENCOUNTER — Encounter: Payer: Self-pay | Admitting: Physical Therapy

## 2021-10-13 ENCOUNTER — Ambulatory Visit: Payer: Medicare Other | Attending: Nurse Practitioner | Admitting: Physical Therapy

## 2021-10-13 DIAGNOSIS — R262 Difficulty in walking, not elsewhere classified: Secondary | ICD-10-CM | POA: Diagnosis not present

## 2021-10-13 DIAGNOSIS — M6281 Muscle weakness (generalized): Secondary | ICD-10-CM | POA: Insufficient documentation

## 2021-10-13 DIAGNOSIS — R278 Other lack of coordination: Secondary | ICD-10-CM | POA: Insufficient documentation

## 2021-10-13 NOTE — Therapy (Signed)
[facility] [address] ?[place], [place], [address] ?Phone: [phone]   Fax:  [phone] ? ?Physical Therapy Treatment ? ?Patient Details  ?Name: [name] [name] ?MRN: [id number] ?Date of Birth: 10/26/1945 ?Referring Provider (PT): Lazaro Arms ? ? ?Encounter Date: 10/13/2021 ? ? PT End of Session - 10/13/21 1358   ? ? Visit Number 34   ? Date for PT Re-Evaluation 11/08/21   ? Authorization Type MCR and BCBS   ? Activity Tolerance Patient tolerated treatment well   ? Behavior During Therapy [facility] for tasks assessed/performed   ? ?  ?  ? ?  ? ? ?Past Medical History:  ?Diagnosis Date  ? Arthritis   ? Closed fracture of fifth metatarsal bone 05/13/2018  ? Deep vein thrombosis ([place])   ? right calf - 05/2012   ? Diabetes mellitus without complication ([place])   ? diet controlled   ? GERD (gastroesophageal reflux disease)   ? Hyperlipidemia   ? Hypertension   ? Ejection fraction =>55% Left ventricular systolic function is normal. Left ventricular wall motion is normal    ? Lymphocytic colitis   ? Neuropathy   ? diabetic - in bilateral feet   ? Pityriasis lichenoides chronica   ? Sleep apnea   ? bipap  ? ? ?Past Surgical History:  ?Procedure Laterality Date  ? DILATION AND CURETTAGE OF UTERUS    ? EYE SURGERY  07/2020  ? HAMMER TOE SURGERY    ? right hand surgery     ? due to blood infection   ? torn meniscus repair     ? right knee   ? TOTAL HIP ARTHROPLASTY  09/13/2012  ? Procedure: TOTAL HIP ARTHROPLASTY ANTERIOR APPROACH;  Surgeon: Mauri Pole, MD;  Location: [facility];  Service: Orthopedics;  Laterality: Right;  ? ? ?There were no vitals filed for this visit. ? ? Subjective Assessment - 10/13/21 1325   ? ? Subjective I had knee injection last week, overall I am feeling very weak today, not feeling great overall   ? Currently in Pain? No/denies   ? ?  ?  ? ?  ? ? ? ? ? ? ? ? ? ? ? ? ? ? ? ? ? ? ? ? [facility] Adult PT Treatment/Exercise - 10/13/21 0001   ? ?  ? Ambulation/Gait   ? Gait Comments outside 200 feet with SPC and HHA   ?  ? Lumbar Exercises: Supine  ? Clam 20 reps   ? Clam Limitations red tband   ? Bridge with [employer] 20 reps   ? Straight Leg Raise 20 reps;1 second   ? Straight Leg Raises Limitations with TA set/posterior pelvic tilt, low   ? Other Supine Lumbar Exercises posterior pelvic tilt x15 with 3 second holds; marches with posterior pelvic tilt 1x15B, feet on ball K2C, trunk rotaiton, small bridges and isometric abs   ?  ? Knee/Hip Exercises: Aerobic  ? Nustep L4 x 6 min   ?  ? Knee/Hip Exercises: Machines for Strengthening  ? Other Machine 5# straight arm pulls 2x10, 5# biceps 15# triceps   ?  ? Knee/Hip Exercises: Seated  ? Other Seated Knee/Hip Exercises red tband hip IR with ball b/n knees   ? Other Seated Knee/Hip Exercises green TB anlke DF 1x15, ankle inversion/eversions 1x10 B green TB   ? Hamstring Curl Both;2 sets;10 reps   ? Hamstring Limitations Green band   ? ?  ?  ? ?  ? ? ? ? ? ? ? ? ? ? ? ?  PT Short Term Goals - 07/23/21 1514   ? ?  ? PT SHORT TERM GOAL #1  ? Title I with basic HEP   ? Baseline 12/14- almost every day, have been practice walking too but weather has made it hard   ? Time 3   ? Period Weeks   ? Status On-going   ? ?  ?  ? ?  ? ? ? ? PT Long Term Goals - 10/06/21 1103   ? ?  ? PT LONG TERM GOAL #1  ? Title Improve motor strength in BLE to 4+/5, with good endurance to tolerate at least 2 x 20 reps of all strenht exercises.   ? Status Partially Met   ?  ? PT LONG TERM GOAL #2  ? Title Improve 5x sit to stand time to 12 seconds from normal test height surface, in order to improve ability to rise from restaraunt chairs iwthout use of armrests.   ? Status Partially Met   ?  ? PT LONG TERM GOAL #3  ? Title Patient will ambulate functionally x at least 500', on level and unlevel surfaces with LRAD, MI.   ? Status On-going   ?  ? PT LONG TERM GOAL #4  ? Title Patient will tolerate sitting/ standing activities during 45 minute treatment, with  maximum of 4 rest breaks, with no C/O back pain or fatigue.   ? Status Partially Met   ?  ? PT LONG TERM GOAL #5  ? Title Up and down at least 5 steps with B rails, MI   ? Status Partially Met   ?  ? PT LONG TERM GOAL #6  ? Title Will improve Berg score to at least 38 in order to show improved functional balance skills/reduced fall risk   ? Status Partially Met   ? ?  ?  ? ?  ? ? ? ? ? ? ? ? Plan - 10/13/21 1359   ? ? Clinical Impression Statement Patient comes in and reports not feeling great, reports feeling weak in the legs and stomach not great.  She tolerated the activities okay, just seemed to be a little more lethargic and tired today, had the injections in the knees last week.   ? PT Next Visit Plan continue to progress as she had a set back with the fall; talk about fall education   ? Consulted and Agree with Plan of Care Patient   ? ?  ?  ? ?  ? ? ?[name]:  Abnormal gait, Decreased balance, Decreased endurance, Decreased mobility, Difficulty walking, Impaired sensation, Improper body mechanics, Cardiopulmonary status limiting activity, Decreased activity tolerance, Decreased strength, Pain, Postural dysfunction ? ?Visit Diagnosis: ?Difficulty in walking, not elsewhere classified ? ?Muscle weakness (generalized) ? ?Other lack of coordination ? ? ? ? ?Problem List ?Patient Active Problem List  ? Diagnosis Date Noted  ? Chronic hiccups 05/01/2021  ? Memory loss 05/01/2021  ? History of COVID-19 05/01/2021  ? Olfactory impairment 05/01/2021  ? Senile purpura ([place]) 11/15/2020  ? Difficulty with speech 07/23/2020  ? SOB (shortness of breath) 07/23/2020  ? Dyslipidemia 11/12/2019  ? Pityriasis lichenoides chronica 09/18/2019  ? Insomnia 08/07/2019  ? Anxiety 08/07/2019  ? Psoriatic arthritis ([place]) -Dr. Trudie Reed 08/07/2019  ? Gout 08/07/2019  ? Recurrent UTI 08/07/2019  ? Charcot's joint of left foot  08/07/2019  ? Depression 08/07/2019  ? Pneumonia due  to COVID-19 virus 07/09/2019  ? Physical deconditioning 07/08/2019  ? COVID-19 07/07/2019  ? Diabetic peripheral neuropathy ([place]) 04/10/2019  ? Pain in left knee 10/13/2018  ? Ischemic colitis ([place]) 10/06/2018  ? Urinary incontinence 07/21/2018  ? Diabetic foot ulcer ([place]) 07/13/2018  ? DM2 (diabetes mellitus, type 2) ([place]) 05/02/2018  ? Psoriasis 05/02/2018  ? Osteoarthritis of knee 10/18/2017  ? Right bundle branch block 05/20/2014  ? OSA (obstructive sleep apnea), BiPAP 03/12/2013  ? Essential hypertension 03/12/2013  ? Obesity 09/14/2012  ? Hyponatremia 09/14/2012  ? S/P right THA, AA 09/13/2012  ? Deep venous thrombosis of lower extremity ([place]) 08/10/2010  ? 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 ? Sumner Boast, PT ?10/13/2021, 2:02 PM ? ?[facility] [place] [facility] [address] ?[place], [place], [address] ?Phone: [phone]   Fax:  [phone] ? ?Name: [name] [name] ?MRN: [id number] ?Date of Birth: 02-Mar-1946 ? ? ? ?

## 2021-10-15 ENCOUNTER — Ambulatory Visit: Payer: Medicare Other | Admitting: Physical Therapy

## 2021-10-15 DIAGNOSIS — M170 Bilateral primary osteoarthritis of knee: Secondary | ICD-10-CM | POA: Diagnosis not present

## 2021-10-16 ENCOUNTER — Ambulatory Visit (INDEPENDENT_AMBULATORY_CARE_PROVIDER_SITE_OTHER): Payer: Medicare Other | Admitting: Internal Medicine

## 2021-10-16 ENCOUNTER — Encounter: Payer: Self-pay | Admitting: Internal Medicine

## 2021-10-16 VITALS — BP 113/64 | HR 77 | Ht 65.0 in | Wt 205.0 lb

## 2021-10-16 DIAGNOSIS — Q394 Esophageal web: Secondary | ICD-10-CM | POA: Diagnosis not present

## 2021-10-16 DIAGNOSIS — K52839 Microscopic colitis, unspecified: Secondary | ICD-10-CM | POA: Diagnosis not present

## 2021-10-16 DIAGNOSIS — Z86010 Personal history of colonic polyps: Secondary | ICD-10-CM | POA: Diagnosis not present

## 2021-10-16 DIAGNOSIS — K222 Esophageal obstruction: Secondary | ICD-10-CM

## 2021-10-16 DIAGNOSIS — K219 Gastro-esophageal reflux disease without esophagitis: Secondary | ICD-10-CM | POA: Diagnosis not present

## 2021-10-16 DIAGNOSIS — K589 Irritable bowel syndrome without diarrhea: Secondary | ICD-10-CM

## 2021-10-16 DIAGNOSIS — R1310 Dysphagia, unspecified: Secondary | ICD-10-CM

## 2021-10-16 MED ORDER — LOPERAMIDE HCL 2 MG PO TABS: 2.0000 mg | ORAL_TABLET | Freq: Three times a day (TID) | ORAL | 3 refills | Status: DC | PRN

## 2021-10-16 MED ORDER — BUDESONIDE ER 9 MG PO CP24: 9.0000 mg | ORAL_CAPSULE | Freq: Every day | ORAL | 6 refills | Status: DC

## 2021-10-16 NOTE — Patient Instructions (Signed)
We have sent the following medications to your pharmacy for you to pick up at your convenience: Budesonide  Imodium AD ? ?Follow up in 6 months ? ?If you are age 77 or older, your body mass index should be between 23-30. Your Body mass index is 34.11 kg/m?Marland Kitchen If this is out of the aforementioned range listed, please consider follow up with your Primary Care Provider. ? ?If you are age 104 or younger, your body mass index should be between 19-25. Your Body mass index is 34.11 kg/m?Marland Kitchen If this is out of the aformentioned range listed, please consider follow up with your Primary Care Provider.  ? ?________________________________________________________ ? ?The [facility] providers would like to encourage you to use [facility] to communicate with providers for non-urgent requests or questions.  Due to long hold times on the telephone, sending your provider a message by [facility] may be a faster and more efficient way to get a response.  Please allow 48 business hours for a response.  Please remember that this is for non-urgent requests.  ?_______________________________________________________  ? ?I appreciate the  opportunity to care for you ? ?Thank You  ? ?Sonny Masters Dorsey,MD ?

## 2021-10-16 NOTE — Progress Notes (Signed)
Chief Complaint: Microscopic colitis  HPI : 76 year old female with history of microscopic colitis, IBS, DVT, DM, GERD, HTN, HFpEF, OSA, and psoriatic arthritis presents for follow up for microscopic colitis  Interval History: She might have experienced a slight improvement in her dysphagia after esophageal dilation but she will occaisonally stilll have dysphagia, particularly with taking large pills. She did well after her EGD and colonoscopy procedures. She is pleased with her stool frequency at this time. Most of the time she has one BM per day. She is now taking Imodium TID, reduced from QID previously. Her PCP recommended that she try a stool softener, which she thinks has been helping to prevent some issues with constipation.   Wt Readings from Last 3 Encounters:  10/16/21 205 lb (93 kg)  09/17/21 218 lb (98.9 kg)  09/16/21 206 lb 12.7 oz (93.8 kg)   Current Outpatient Medications  Medication Sig Dispense Refill   Accu-Chek Softclix Lancets lancets Use as instructed to check sugars 2-3 times daily.  E11.42 100 each 12   acetaminophen (TYLENOL) 325 MG tablet Take 2 tablets (650 mg total) by mouth every 6 (six) hours as needed for mild pain or headache.     Alcohol Swabs PADS UAD to check sugars.  E11.65 100 each 12   allopurinol (ZYLOPRIM) 100 MG tablet TAKE 1 TABLET(100 MG) BY MOUTH TWICE DAILY 180 tablet 3   ALPRAZolam (XANAX) 0.25 MG tablet TAKE 1 TABLET(0.25 MG) BY MOUTH TWICE DAILY AS NEEDED FOR ANXIETY 20 tablet 0   ascorbic acid (VITAMIN C) 500 MG tablet Take 500 mg by mouth at bedtime.     aspirin 81 MG EC tablet      augmented betamethasone dipropionate (DIPROLENE-AF) 0.05 % cream      azelastine (ASTELIN) 0.1 % nasal spray INSTILL 2 SPRAYS IN EACH NOSTRIL TWICE DAILY 30 mL 12   Blood Glucose Monitoring Suppl (ACCU-CHEK GUIDE) w/Device KIT AS DIRECTED 1 kit 0   Budesonide 9 MG TB24 Take 9 mg by mouth daily.      carvedilol (COREG) 6.25 MG tablet TAKE 1 TABLET(6.25 MG) BY  MOUTH TWICE DAILY WITH A MEAL 60 tablet 3   Certolizumab Pegol (CIMZIA [place]) Inject 400 mg into the skin every 30 (thirty) days. 200 mg on each side of stomach     Cholecalciferol (VITAMIN D3) 1000 UNITS CAPS Take 1,000 Units by mouth 2 (two) times daily.      Cyanocobalamin (VITAMIN B-12) 1000 MCG SUBL Place 1,000 mcg under the tongue daily.     cyclobenzaprine (FLEXERIL) 10 MG tablet TAKE 1 TABLET BY MOUTH AT BEDTIME 90 tablet 1   docusate sodium (COLACE) 100 MG capsule TAKE 1 CAPSULE(100 MG) BY MOUTH TWICE DAILY 60 capsule 2   folic acid (FOLVITE) 1 MG tablet TAKE 1 TABLET(1 MG) BY MOUTH DAILY 90 tablet 3   glucose blood (ACCU-CHEK GUIDE) test strip Use as instructed to check sugars 2-3 times daily.  E11.42 100 each 12   Insulin Pen Needle (B-D UF III MINI PEN NEEDLES) 31G X 5 MM MISC Use daily for insulin pen 90 each 3   LEVEMIR FLEXTOUCH 100 UNIT/ML FlexTouch Pen Inject 25 Units into the skin 2 (two) times daily. 50 mL 5   lisinopril-hydrochlorothiazide (ZESTORETIC) 20-25 MG tablet TAKE 1 TABLET BY MOUTH DAILY 90 tablet 1   loperamide (IMODIUM) 2 MG capsule Take 6 mg by mouth 2 (two) times daily.     Melatonin 5 MG SUBL Place 5 mg under  the tongue at bedtime.     metFORMIN (GLUCOPHAGE-XR) 500 MG 24 hr tablet TAKE 1 TABLET(500 MG) BY MOUTH TWICE DAILY 180 tablet 2   methotrexate (RHEUMATREX) 2.5 MG tablet Take 20 mg by mouth every Friday.      naftifine (NAFTIN) 1 % cream Apply 1 application. topically daily as needed (rash).     omeprazole (PRILOSEC) 40 MG capsule TAKE 1 CAPSULE BY MOUTH EVERY DAY 90 capsule 1   potassium chloride (KLOR-CON) 10 MEQ tablet Take 3 tablets (30 mEq total) by mouth daily. 270 tablet 3   Probiotic Product (ALIGN PO) Take 1 capsule by mouth daily.     rosuvastatin (CRESTOR) 5 MG tablet TAKE 1 TABLET(5 MG) BY MOUTH DAILY 90 tablet 3   sertraline (ZOLOFT) 100 MG tablet Take 1 tablet (100 mg total) by mouth daily. 90 tablet 1   Current Facility-Administered  Medications  Medication Dose Route Frequency Provider Last Rate Last Admin   0.9 %  sodium chloride infusion  500 mL Intravenous Once Sharyn Creamer, MD       Review of Systems: All systems reviewed and negative except where noted in HPI.   Physical Exam: BP 113/64    Pulse 77    Ht $R'5\' 5"'MT$  (1.651 m)    Wt 205 lb (93 kg)    SpO2 97%    BMI 34.11 kg/m  Constitutional: Pleasant,well-developed, female in no acute distress. HEENT: Normocephalic and atraumatic. Conjunctivae are normal. No scleral icterus. Cardiovascular: Normal rate, regular rhythm.  Pulmonary/chest: Effort normal and breath sounds normal. No wheezing, rales or rhonchi. Abdominal: Soft, nondistended, nontender. Bowel sounds active throughout. There are no masses palpable. No hepatomegaly. Extremities: No edema Neurological: Alert and oriented to person place and time. Skin: Skin is warm and dry. No rashes noted. Psychiatric: Normal mood and affect. Behavior is normal.  Labs 11/2020: CMP with low K of 3.3, elevated glucose of 220, ALT mildly elevated at 45, albumin low at 3.2. HbA1C of 8.5%  Labs 05/2021: CBC with elevated WBC of 11.9. CRP mildly elevated at 1.2 (decreased from 1.6). ESR mildly elevated at 35.  CT A/P w/contrast 10/02/18: 1.  Descending and sigmoid colon segmental mural thickening and  pericolonic stranding indicating colitis.  Distribution of thickening  can suggest ischemic colitis although inflammatory colitis is not  excluded.  There are mild associated diverticula but no focal inflamed  diverticulum.  2.  Pancreas atrophy.  Multifocal hyperenhancing nodular density at  the tail of pancreas, 12 mm.  Small developing mass cannot be  excluded.  Recommend follow-up MRI pancreas in 6 months.  This finding  was not described in the initial [facility] preliminary report.  Ab U/S 10/03/18: Mild fatty liver. Cholelithiasis without findings of acute  cholecystitis. Extra hepatic bile ducts and pancreatic tail are   obscured.  CT A/P w/contrast 11/02/18: IMPRESSION: 1. Heterogeneous fatty replacement throughout the pancreas, without pancreatic mass or acute process. 2.  Possible constipation. 3. Hepatic steatosis and hepatomegaly. 4.  Tiny hiatal hernia. 5.  Aortic Atherosclerosis (ICD10-I70.0). 6. Degraded evaluation of the pelvis, secondary to beam hardening artifact from right hip arthroplasty. 7. Uterine fibroid.  CT A/P w/o contrast 07/12/20: IMPRESSION: 1. Diverticulosis without diverticulitis. 2. No acute intra-abdominal or intrapelvic process. 3.  Aortic Atherosclerosis (ICD10-I70.0).  Barium swallow 09/27/20: IMPRESSION: 1. Proximal anterior esophageal web, nonobstructive to passage of a 13 mm barium tablet. 2. Otherwise unremarkable double contrast barium esophagram.  EGD 09/17/21: Findings: - Multiple mild (non-circumferential scarring) stenoses were  found in the entire esophagus. The stenoses were traversed. A guidewire was placed and the scope was withdrawn. Dilation was performed with a Savary dilator with no resistance at 15 mm and mild resistance at 16 mm. The dilation site was examined and showed mild mucosal disruption in the upper esophagus. - A small hiatal hernia was present. - Localized mild inflammation characterized by congestion (edema), erythema and granularity was found in the gastric body and in the gastric antrum. Biopsies were taken with a cold forceps for histology. - Multiple sessile polyps with no bleeding and no stigmata of recent bleeding were found in the gastric body. Biopsies were taken with a cold forceps for histology. - The examined duodenum was normal. Impression: - Mild esophageal stenoses. Dilated. - Small hiatal hernia. - Gastritis. Biopsied. - Multiple gastric polyps. Biopsied. - Normal examined duodenum. Path: 1. Surgical [P], gastric - ANTRAL AND OXYNTIC MUCOSA WITH SLIGHT CHRONIC INFLAMMATION. - NO HELICOBACTER PYLORI IDENTIFIED. 2.  Surgical [P], gastric polyps - FUNDIC GLAND POLYPS.  Colonoscopy 09/17/21: - One 12 mm polyp in the cecum, removed with a hot snare. Resected and retrieved. - Seven 3 to 8 mm polyps in the transverse colon and in the ascending colon, removed with a cold snare. Resected and retrieved. - Diverticulosis in the sigmoid colon and in the descending colon. - One 7 mm polyp in the rectum, removed with a cold snare. Resected and retrieved. - Non-bleeding internal hemorrhoids. Path: 3. Surgical [P], colon, cecum, polyp (1) - TUBULAR ADENOMA WITHOUT HIGH GRADE DYSPLASIA. 4. Surgical [P], colon, ascending, transverse, polyp (7) - TUBULAR ADENOMA (7) WITHOUT HIGH GRADE DYSPLASIA. 5. Surgical [P], colon, random sites - UNREMARKABLE COLONIC MUCOSA. - NO MICROSCOPIC COLITIS, ACTIVE INFLAMMATION OR CHRONIC CHANGES. 6. Surgical [P], colon, rectum, polyp (1) - TUBULOVILLOUS ADENOMA WITHOUT HIGH GRADE DYPLASIA.  ASSESSMENT AND PLAN: Microscopic colitis History of colon polyps IBS GERD Esophageal stenosis Esophageal web Dysphagia Patient overall has been doing well. BMs are well controlled on budesonide 9 mg QD, Imodium, and Cimzia to help keep her microscopic colitis under control.  Her recent colonoscopy showed that her microscopic colitis is in remission. Several colon polyps were also removed during that colonoscopy as well. Due to her age and comorbidities, would be reasonable to discontinue further polyp surveillance. We discussed the risks and benefits of reducing the dose of her budesonide, she is not interested at this time because her diarrhea has recurred when weaning of her budesonide has been attempted in the past. I think this is reasonable. She had a recent EGD with dilation, but continue to have occasional episodes of dysphagia, particularly with large pills. If patient has worsening of her dysphagia in the future, could consider repeat EGD with dilation - Continue budesonide 9 mg QD.  Refilled. - Refilled Imodium TID - RTC 6 months  [name], MD  I spent 36 minutes of time, including in depth chart review, independent review of results as outlined above, communicating results with the patient directly, face-to-face time with the patient, coordinating care, and ordering studies and medications as appropriate, and documentation.

## 2021-10-17 ENCOUNTER — Other Ambulatory Visit: Payer: Self-pay | Admitting: Internal Medicine

## 2021-10-20 ENCOUNTER — Other Ambulatory Visit: Payer: Self-pay

## 2021-10-20 ENCOUNTER — Ambulatory Visit (INDEPENDENT_AMBULATORY_CARE_PROVIDER_SITE_OTHER): Payer: Medicare Other | Admitting: Cardiovascular Disease

## 2021-10-20 ENCOUNTER — Encounter: Payer: Self-pay | Admitting: Cardiovascular Disease

## 2021-10-20 ENCOUNTER — Ambulatory Visit: Payer: Medicare Other | Admitting: Physical Therapy

## 2021-10-20 ENCOUNTER — Encounter: Payer: Self-pay | Admitting: Physical Therapy

## 2021-10-20 VITALS — BP 132/74 | HR 77 | Ht 65.5 in | Wt 203.0 lb

## 2021-10-20 DIAGNOSIS — L4050 Arthropathic psoriasis, unspecified: Secondary | ICD-10-CM | POA: Diagnosis not present

## 2021-10-20 DIAGNOSIS — M6281 Muscle weakness (generalized): Secondary | ICD-10-CM | POA: Diagnosis not present

## 2021-10-20 DIAGNOSIS — R262 Difficulty in walking, not elsewhere classified: Secondary | ICD-10-CM | POA: Diagnosis not present

## 2021-10-20 DIAGNOSIS — E118 Type 2 diabetes mellitus with unspecified complications: Secondary | ICD-10-CM | POA: Diagnosis not present

## 2021-10-20 DIAGNOSIS — I10 Essential (primary) hypertension: Secondary | ICD-10-CM | POA: Diagnosis not present

## 2021-10-20 DIAGNOSIS — G4733 Obstructive sleep apnea (adult) (pediatric): Secondary | ICD-10-CM

## 2021-10-20 DIAGNOSIS — R278 Other lack of coordination: Secondary | ICD-10-CM

## 2021-10-20 DIAGNOSIS — E785 Hyperlipidemia, unspecified: Secondary | ICD-10-CM

## 2021-10-20 MED ORDER — LISINOPRIL 20 MG PO TABS: 20.0000 mg | ORAL_TABLET | Freq: Every day | ORAL | 3 refills | Status: DC

## 2021-10-20 NOTE — Patient Instructions (Addendum)
Medication Instructions:  ? ?-Stop lisinopril-HCTZ (zestorectic). ? ?-Start lisinopril (zestril) '20mg'$  once daily. ?If top number of blood pressure is 100 or less only take 1/2 ('10mg'$ ). ? ?*If you need a refill on your cardiac medications before your next appointment, please call your pharmacy* ? ? ?Follow-Up: ?At [facility], you and your health needs are our priority.  As part of our continuing mission to provide you with exceptional heart care, we have created designated Provider Care Teams.  These Care Teams include your primary Cardiologist (physician) and Advanced Practice Providers (APPs -  Physician Assistants and Nurse Practitioners) who all work together to provide you with the care you need, when you need it. ? ?We recommend signing up for the patient portal called "MyChart".  Sign up information is provided on this After Visit Summary.  MyChart is used to connect with patients for Virtual Visits (Telemedicine).  Patients are able to view lab/test results, encounter notes, upcoming appointments, etc.  Non-urgent messages can be sent to your provider as well.   ?To learn more about what you can do with MyChart, go to [email or web address].   ? ?Your next appointment:   ?3 month(s) ? ?The format for your next appointment:   ?In Person ? ?Provider:   ?Shelva Majestic, MD  ?

## 2021-10-20 NOTE — Progress Notes (Unsigned)
Patient ID: [name] [name], female   DOB: 09-15-1945, 76 y.o.   MRN: [id number]     HPI: [name] [name] is a 76 y.o. female percents in the office today for a followup sleep/cardiology evaluation.She was last evaluated in a telemedicine visit in October 2021.  [name] [name] is a retired [employer]  who has a history of hypertension, obesity, psoriatic arthritis, DVT, which occurred while traveling to [place], as well as complex obstructive sleep apnea.  She has had difficulty in the past with CPAP therapy. She has been able to tolerate BiPAP and has had a Resp[employer]ics BiPAP Auto unit well but also has had difficulty with some of her high pressure requirements. In April 2014 I changed her maximum BiPAP pressure to 19 and her maximum EPAP pressure to 15. I also reduced her minimum EPAP pressure to 6 and her minimal IPAP pressure to 10 with pressure support of 4 and changed her Bi_Flex to 3. She has been followed by [name] [name].  Clinically, she had felt markedly improved with BiPAP.  Compared to prior CPAP therapy.  She has psoriatic arthritis and is on Humira as well as methotrexate followed by rheumatology.  She also has a Charcot joint in her right foot.   She is on Cymbalta for pain relief relative to this. She does not routinely exercise.  She keeps busy moving around, but typically does not do aerobic activity.  She has not been successful with significant weight loss.   She recently, she has been taking lisinopril HCTZ 20/12.5 mg for hypertension.  She had experienced an episode of chest discomfort which she felt was like a cramp and she noticed this most when standing up, but was associated with diaphoresis and shortness of breath.  She does have family history for coronary artery disease both in her mother as well as in her maternal grandmother. She has not been able to be very active due to her Charcot foot.  She also had noticed some mild shortness of breath with activity.  She eventually was referred  for a nuclear perfusion study on 01/31/2015 which revealed normal perfusion and function; ejection fraction 64%.  When I saw her in February 2017, her BiPAP machine had begun to fail.  She continues to use CPAP with 100% compliance and cannot sleep without it.  She typically sleeps for at least 8 hours per night.  She had taken her BiPAP machine to [name] who stated that her machine essentially had stopped working and is in need for new machine.   She received a new Resp[employer]ics Dream station auto BiPAP unit in April 2017.  She uses a Customer service manager FX full face mask, medium size.  She feels that the machine has been working well but she has had difficulty with her sleep.  At times she feels that she is not getting enough pressure.  Previously she had been set on a BiPAP auto unit with an EPAP minimum of 8 and IPAP max of 19 , which had worked well.  I obtained a new download today from 02/16/2016 through 03/16/2016.  She is 100% compliant and is averaging 8 hours and 25 minutes of sleep.  Apparently, she is not been set on auto and has been on and IPAP of 14 and an EPAP of 10.  Her AHI is increased at 25.1. I changed her BiPAP mode from a fixed pressure to auto BiPAP initiating atenolol over 6 with potential titration up to 20/16.  I also allowed for pressure support  to change from 4-6 as needed.  IN 2018, she felt well from a cardiac perspective, but has had significant infections including MRSA, strep throat, UTI, and she had fractured her right ankle.  As result, she has noticed some ankle swelling right greater than left.  She continues to use her BiPAP and believes she is feeling much better than she had in the past.  [name] is her DME company.  A download in the office from 01/26/2017 through 04/25/2017.  She is 100% compliance.  She is averaging 8 hours and 36 minutes of sleep per night.  Her average device IPAP pressure was 18.2 and average device CPAP pressure greater than 90% of the time was 14.1.  AHI  continued to be elevated at 21.2. An Epworth scale score was calculated in the office today and this endorsed at 4, arguing against daytime sleepiness.  I last saw her, she was sleeping adequate duration.  Her AHI remains elevated I suggested that she reduce ramp time from 30 minutes down to 20 minutes and increased her starting pressure from 4 up to 6 cm.  I saw her in September 2019 at which time she had again started to notice  symptoms where she was waking up at night.  She noted leaks from her mask at higher pressures.  She feels that she is more  tired.  She had some swelling right lower extremity greater than left and she has a Charcot right foot.  She had broken her right ankle in August 2018.  She denied any chest pain.  She is unaware of any cardiac arrhythmias.  Her daughter, [name] [name] is a GI physician who had been practicing in [facility] and will be moving up to work with [name].  She is grateful that she will now have her twin granddaughters close by.  She was recently placed on Cimzia for her psoriatic arthritis to take in addition to her methotrexate.  She is followed by [name] [name] for her rheumatologic issues.   When I evaluated her in December 2019 she wassleeping 8 hours 29 minutes and her average IPAP pressure greater than 90% 16.7 with an average EPAP pressure of 12.4.  I reduced her ramp time down to 20 minutes.  Having issues with her mask and I recommended a trial of the new DreamWear full facemask by either a [employer] or a [facility].  Over the past several months, she has not been able to be active due to her fracture of her fifth metatarsal.  She was in a hard cast for 2 months with no weightbearing.  As result she has not been able to exercise.  She has gained weight.  Was just removed several weeks ago.  She admits to ankle swelling.  She continues to use her BiPAP.  A new download was obtained and she again is compliant.  Her average device IPAP  pressure greater than 90% of the time was 18.1 with a maximum titrated IPAP pressure of 20.  AHI remained elevated at 17.9.    She went to [place] in January 2020 and unfortunately became ill and spent the entire trip in the hospital due to ischemic colitis.  She was subsequently evaluated by Fabian Sharp in a telemedicine visit in June 2020.  When I last evaluated her in a telemedicine visit in June 2021 she had not been able to do much walking because of tenderness from her bone on the plantar aspect of her foot.  She  has fallen arches.  She is being evaluated by Dr. Doran Durand.  Around Thanksgiving 2020 both she and her husband both became ill with COVID-19 pneumonia and both were hospitalized for approximately a week.  She had temperatures of 103, she was treated with remdesivir, convalescent plasma in addition to steroids.  She has subsequently not regained her sense of taste or smell and has continued to experience fatigability.  During that time she was not able to use BiPAP.  Apparently she was having issues stating that Medicare is going to deny her BiPAP.  She has continued to use BiPAP therapy.  I attempted to try to get a download from her [employer] website today but we were unable to do this in the office.  I have reached out to Barry Brunner who will contact [employer] to see if we can obtain her most recent download.  Ms. Risser continues to be on lisinopril/HCTZ for hypertension, rosuvastatin 5 mg for hyperlipidemia.  She was on methotrexate for rheumatoid arthritis but this was stopped approximately 6 weeks ago.  She is diabetic on metformin.     I scheduled her for a 2D echo Doppler study to reassess LV function following her Covid infection which was done on February 06, 2020.  LV function was excellent with EF of 65 to 70%.  There was grade 1 diastolic dysfunction.  There was mild mitral regurgitation.   I evaluated her in a telemedicine visit on May 24, 2020.  Since her last evaluation, her  Respironics dream station has been on recall.  She has registered with [employer] and attempt to get a new machine.  She had called the office shortly thereafter wondering if she should still use therapy.  At that time with the severity of her sleep apnea and the incidence of only 0.3% of devices with problems I had recommended she continue therapy.  She uses [name] as her DME company.  I asked her today if she had been contacted regarding insertion of a bacteria inlet filter device which she could place into her tubing but apparently this had not been done by [name] [name].  She feels well.  She again has been immobile and that her left leg is in a hard cast making it difficult to walk.  She does note some occasional ankle swelling.  She was wondering if she should continue with aspirin which she has been taking 81 mg 3 times a week.  She admits to being under increased stress since her husband unfortunately had a brain bleed but is starting to improve.  She has been on lisinopril HCT 20/25 mg.  She has continued to be on lipid-lowering therapy with rosuvastatin 5 mg and LDL cholesterol was 40.   Since her last evaluation, she states that she became sick in November 2021 with an intestinal virus, she required ICU she tells me she was cared for by Dr. Cristina Gong.  He apparently had an extensive hospital stay.  She has had difficulty with long COVID resulting from her COVID infection in November 2020.  She recently has been under months of extensive care for her left foot ulcers requiring wound care visits for her previous infections which has resolved.  From a sleep perspective, she has had difficulty and recently adjustments were made to her dream station auto BiPAP therapy.  Her device is on recall and she is not been aware of any timeframe when potentially she can receive a new device.  Due to continued difficulty with her unit, approximately 1  month ago her pressures her pressures were adjusted such that now her  ramp time is set at 20 minutes, ramp start pressure 6, minimum EPAP 14, maximum IPAP 25, with pressure support range of 3 to 8 cm of water.  Her most recent download shows 100% usage with average use at 9 hours and 49 minutes per night.  Her 90% pressures are 22.3/19.0 with maximum titrated pressures at 25/23.  Despite this average AHI continues to be elevated at 16.3.  She presents for evaluation.   Past Medical History:  Diagnosis Date   Arthritis    Closed fracture of fifth metatarsal bone 05/13/2018   Deep vein thrombosis (HCC)    right calf - 05/2012    Diabetes mellitus without complication (HCC)    diet controlled    GERD (gastroesophageal reflux disease)    Hyperlipidemia    Hypertension    Ejection fraction =>55% Left ventricular systolic function is normal. Left ventricular wall motion is normal     Lymphocytic colitis    Neuropathy    diabetic - in bilateral feet    Pityriasis lichenoides chronica    Sleep apnea    bipap    Past Surgical History:  Procedure Laterality Date   DILATION AND CURETTAGE OF UTERUS     EYE SURGERY  07/2020   HAMMER TOE SURGERY     right hand surgery      due to blood infection    torn meniscus repair      right knee    TOTAL HIP ARTHROPLASTY  09/13/2012   Procedure: TOTAL HIP ARTHROPLASTY ANTERIOR APPROACH;  Surgeon: Mauri Pole, MD;  Location: [facility];  Service: Orthopedics;  Laterality: Right;    Allergies  Allergen Reactions   Other Shortness Of Breath    Blue fish: palms and feet turn red   Thorazine [Chlorpromazine] Other (See Comments)    Made her more more off balanced, vision   Bee Venom Swelling   Farxiga [Dapagliflozin]     Recurrent yeast infections   Semaglutide     Rybelsus - lip swelling, GI upset   Sulfa Antibiotics Hives    Current Outpatient Medications  Medication Sig Dispense Refill   Accu-Chek Softclix Lancets lancets Use as instructed to check sugars 2-3 times daily.  E11.42 100 each 12   acetaminophen  (TYLENOL) 325 MG tablet Take 2 tablets (650 mg total) by mouth every 6 (six) hours as needed for mild pain or headache.     Alcohol Swabs PADS UAD to check sugars.  E11.65 100 each 12   allopurinol (ZYLOPRIM) 100 MG tablet TAKE 1 TABLET(100 MG) BY MOUTH TWICE DAILY 180 tablet 3   ALPRAZolam (XANAX) 0.25 MG tablet TAKE 1 TABLET(0.25 MG) BY MOUTH TWICE DAILY AS NEEDED FOR ANXIETY 20 tablet 0   ascorbic acid (VITAMIN C) 500 MG tablet Take 500 mg by mouth at bedtime.     aspirin 81 MG EC tablet      augmented betamethasone dipropionate (DIPROLENE-AF) 0.05 % cream      azelastine (ASTELIN) 0.1 % nasal spray INSTILL 2 SPRAYS IN EACH NOSTRIL TWICE DAILY 30 mL 12   Blood Glucose Monitoring Suppl (ACCU-CHEK GUIDE) w/Device KIT AS DIRECTED 1 kit 0   Budesonide ER 9 MG CP24 Take 9 mg by mouth daily in the afternoon. 30 capsule 6   carvedilol (COREG) 6.25 MG tablet TAKE 1 TABLET(6.25 MG) BY MOUTH TWICE DAILY WITH A MEAL 60 tablet 3   Certolizumab Pegol (CIMZIA )  Inject 400 mg into the skin every 30 (thirty) days. 200 mg on each side of stomach     Cholecalciferol (VITAMIN D3) 1000 UNITS CAPS Take 1,000 Units by mouth 2 (two) times daily.      Cyanocobalamin (VITAMIN B-12) 1000 MCG SUBL Place 1,000 mcg under the tongue daily.     cyclobenzaprine (FLEXERIL) 10 MG tablet TAKE 1 TABLET BY MOUTH AT BEDTIME 90 tablet 1   docusate sodium (COLACE) 100 MG capsule TAKE 1 CAPSULE(100 MG) BY MOUTH TWICE DAILY 60 capsule 2   folic acid (FOLVITE) 1 MG tablet TAKE 1 TABLET(1 MG) BY MOUTH DAILY 90 tablet 3   glucose blood (ACCU-CHEK GUIDE) test strip Use as instructed to check sugars 2-3 times daily.  E11.42 100 each 12   Insulin Pen Needle (B-D UF III MINI PEN NEEDLES) 31G X 5 MM MISC Use daily for insulin pen 90 each 3   LEVEMIR FLEXTOUCH 100 UNIT/ML FlexTouch Pen Inject 25 Units into the skin 2 (two) times daily. 50 mL 5   lisinopril (ZESTRIL) 20 MG tablet Take 1 tablet (20 mg total) by mouth daily. 90 tablet 3    loperamide (IMODIUM A-D) 2 MG tablet Take 1 tablet (2 mg total) by mouth 3 (three) times daily as needed for diarrhea or loose stools. 90 tablet 3   loperamide (IMODIUM) 2 MG capsule Take 6 mg by mouth 2 (two) times daily.     Melatonin 5 MG SUBL Place 5 mg under the tongue at bedtime.     metFORMIN (GLUCOPHAGE-XR) 500 MG 24 hr tablet TAKE 1 TABLET(500 MG) BY MOUTH TWICE DAILY 180 tablet 2   methotrexate (RHEUMATREX) 2.5 MG tablet Take 20 mg by mouth every Friday.      naftifine (NAFTIN) 1 % cream Apply 1 application. topically daily as needed (rash).     omeprazole (PRILOSEC) 40 MG capsule TAKE 1 CAPSULE BY MOUTH EVERY DAY 90 capsule 1   potassium chloride (KLOR-CON) 10 MEQ tablet Take 3 tablets (30 mEq total) by mouth daily. 270 tablet 3   Probiotic Product (ALIGN PO) Take 1 capsule by mouth daily.     rosuvastatin (CRESTOR) 5 MG tablet TAKE 1 TABLET(5 MG) BY MOUTH DAILY 90 tablet 3   sertraline (ZOLOFT) 100 MG tablet Take 1 tablet (100 mg total) by mouth daily. 90 tablet 1   No current facility-administered medications for this visit.    Socially she is widowed. She has 2 children and 2 grandchildren. Her daughter is a Development worker, community. There is no tobacco use. She does drink occasional alcohol.  ROS General: Negative; No fevers, chills, or night sweats;  HEENT: Negative; No changes in vision or hearing, sinus congestion, difficulty swallowing Pulmonary: Negative; No cough, wheezing, shortness of breath, hemoptysis Cardiovascular: Negative; No chest pain, presyncope, syncope, palpitations GI: History of lymphocytic colitis GU: Negative; No dysuria, hematuria, or difficulty voiding Musculoskeletal: Positive for R foot charcot joint; recent right ankle fracture Hematologic/Oncology: Negative; no easy bruising, bleeding Rheumatologic: Psoriatic arthritis;  History of gout Endocrine: Negative; no heat/cold intolerance; no diabetes Neuro: Negative; no changes in balance, headaches Skin: History  of psoriatic arthritis Psychiatric: Negative; No behavioral problems, depression Sleep: Positive for complex sleep apnea on BiPAP therapy;  No residual snoring, daytime sleepiness, hypersomnolence, bruxism, restless legs, hypnogognic hallucinations, no cataplexy Other comprehensive 14 point system review is negative.  PE BP 132/74    Pulse 77    Ht 5' 5.5" (1.664 m)    Wt 203 lb (92.1 kg)  SpO2 98%    BMI 33.27 kg/m    Repeat blood pressure by me was 128/66  Wt Readings from Last 3 Encounters:  10/20/21 203 lb (92.1 kg)  10/16/21 205 lb (93 kg)  09/17/21 218 lb (98.9 kg)    General: Alert, oriented, no distress.  Currently in a wheelchair. Skin: normal turgor, no rashes, warm and dry HEENT: Normocephalic, atraumatic. Pupils equal round and reactive to light; sclera anicteric; extraocular muscles intact;  Nose without nasal septal hypertrophy Mouth/Parynx benign; Mallinpatti scale 3 Neck: No JVD, no carotid bruits; normal carotid upstroke Lungs: clear to ausculatation and percussion; no wheezing or rales Chest wall: without tenderness to palpitation Heart: PMI not displaced, RRR, s1 s2 normal, 1/6 systolic murmur, no diastolic murmur, no rubs, gallops, thrills, or heaves Abdomen: soft, nontender; no hepatosplenomehaly, BS+; abdominal aorta nontender and not dilated by palpation. Back: no CVA tenderness Pulses 2+ Musculoskeletal: full range of motion, normal strength, no joint deformities Extremities: Left foot in boot.  Charcot joints.  No clubbing cyanosis , Homan's sign negative  Neurologic: grossly nonfocal; Cranial nerves grossly wnl Psychologic: Normal mood and affect  ECG (independently read by me):  Sinus rhythm at 77, PAC, IRBBB,     April 07, 2021 ECG (independently read by me): NSR at 75, RBBB, no ectopy  December 2019 ECG (independently read by me): Sinus rhythm at 78 bpm.  Mild sinus arrhythmia.  Right bundle branch block with repolarization changes.  April 2019  ECG (independently read by me): Normal sinus rhythm at 83 bpm.  Right bundle branch block with repolarization changes.  QTc interval 4 6 0 ms.  September 2018 ECG (independently read by me): normal sinus rhythm at 79 bpm.  Right bundle-branch block.  QTc interval 481 ms.  June 2016 ECG (independently read by me):  Normal sinus rhythm with right bundle branch block.  October 2015 ECG (independently read by me): Normal sinus rhythm at 71 beats per minute right bundle branch block with repolarization changes.  Normal intervals.  Prior July 2014 ECG sinus rhythm with incomplete right bundle branch block. QRS duration 118 ms. Nonspecific ST-T changes.  LABS: BMP Latest Ref Rng & Units 09/16/2021 11/15/2020 07/23/2020  Glucose 70 - 99 mg/dL 121(H) 220(H) 125(H)  BUN 6 - 23 mg/dL $Remove'18 12 16  '[id number]$ Creatinine 0.40 - 1.20 mg/dL 0.88 0.63 0.70  BUN/Creat Ratio 12 - 28 - - -  Sodium 135 - 145 mEq/L 134(L) 136 139  Potassium 3.5 - 5.1 mEq/L 4.9 3.3(L) 4.1  Chloride 96 - 112 mEq/L 96 96 103  CO2 19 - 32 mEq/L 32 31 28  Calcium 8.4 - 10.5 mg/dL 10.9(H) 9.2 8.6   Hepatic Function Latest Ref Rng & Units 09/16/2021 11/15/2020 07/17/2020  Total Protein 6.0 - 8.3 g/dL 6.9 5.8(L) 4.7(L)  Albumin 3.5 - 5.2 g/dL 4.1 3.2(L) 2.4(L)  AST 0 - 37 U/L 35 33 26  ALT 0 - 35 U/L 31 45(H) 22  Alk Phosphatase 39 - 117 U/L 59 49 38  Total Bilirubin 0.2 - 1.2 mg/dL 0.6 0.3 0.5  Bilirubin, Direct 0.0 - 0.2 mg/dL - - -   CBC Latest Ref Rng & Units 05/27/2021 11/15/2020 07/17/2020  WBC 4.0 - 10.5 K/uL 11.9(H) 9.3 11.7(H)  Hemoglobin 12.0 - 15.0 g/dL 11.8(L) 10.5(L) 9.3(L)  Hematocrit 36.0 - 46.0 % 37.7 31.7(L) 27.4(L)  Platelets 150 - 400 K/uL 253 200.0 221   Lab Results  Component Value Date   MCV 90.4 05/27/2021   MCV  89.8 11/15/2020   MCV 88.7 07/17/2020   Lab Results  Component Value Date   TSH 3.249 07/16/2020    Lab Results  Component Value Date   HGBA1C 6.6 (H) 09/16/2021    Lipid Panel     Component Value  Date/Time   CHOL 125 09/16/2021 0951   TRIG 151.0 (H) 09/16/2021 0951   HDL 50.80 09/16/2021 0951   CHOLHDL 2 09/16/2021 0951   VLDL 30.2 09/16/2021 0951   LDLCALC 44 09/16/2021 0951   LDLDIRECT 139.0 08/15/2019 1114      RADIOLOGY: No results found.  IMPRESSION:  1. OSA (obstructive sleep apnea)   2. Essential hypertension   3. Hyperlipidemia with target LDL less than 70     ASSESSMENT AND PLAN: Ms. Free is a 76 year-old  female who has a history of hypertension, obesity, psoriatic arthritis, complex sleep apnea, as well as a remote history of DVT.    She sees [name] for rheumatology and Dr. Cristina Gong for her lymphocytic colitis.  She had broken her ankle over the past year and this has significantly limited her mobility and ability to walk and exercise.  Over the past year, she had significant difficulty requiring a longstanding hospitalization with GI issues.  In addition she has been plagued by a long COVID syndrome and her initial protracted COVID infection from November 2020.  She has been undergoing wound care for an infection in her left foot which has been an issue since October 2021 and had been seeing Dr. Doran Durand and Jeri Cos, PA-C at wound clinic with resolution of her prior infection.  Most recently she is being cared in [place] at [facility].  From a sleep perspective, she has had difficulty with her DreamStation auto BiPAP unit.  She has not yet heard from [facility] concerning potential new machine since these units have been recalled.  In the past I had discussed with her having a bacterial inlet filter to reduce the very rare risk brought up in the recall.  Over the last month adjustments have been made to her therapy.  Despite these adjustments she continues to have significant increased AHI and currently her 90% pressures are 22.3/19 with maximum titrated pressure at 25/23.0.  Discussed with her the possibility of seeing if we can obtain a new BiPAP auto unit and  I will discuss this with her sleep coordinator who will contact [facility] which is her DME provider.  Presently, blood pressure was increased initially on presentation which did improve on reassessment by me at 140/70.  She is on lisinopril HCT 20/25 mg daily and has been taking furosemide 20 mg on an as-needed basis for leg swelling.  She is on carvedilol 6.25 mg twice a day.  Her ECG today shows sinus rhythm with right bundle branch block and no ectopy.  She is on rosuvastatin 5 mg for hyperlipidemia.  LDL cholesterol in April 2022 was 41.  She is diabetic on metformin and insulin.. She continues to take methotrexate and Cimzia ejections for her psoriatic arthritis.  She is followed by [name] for primary care.  I will see her in 6 months for reevaluation.  Troy Sine, MD, [facility]  10/20/2021 3:28 PM

## 2021-10-20 NOTE — Therapy (Signed)
[place] [facility] ?[place], [place], [address] ?Phone: [phone]   Fax:  [phone] ? ?Physical Therapy Treatment ? ?Patient Details  ?Name: [name] [name] ?MRN: [id number] ?Date of Birth: 1946/07/05 ?Referring Provider (PT): Lazaro Arms ? ? ?Encounter Date: 10/20/2021 ? ? PT End of Session - 10/20/21 1021   ? ? Visit Number 35   ? Date for PT Re-Evaluation 11/08/21   ? Authorization Type MCR and BCBS   ? Authorization Time Period 17 visits for 2023   ? PT Start Time [phone]   ? PT Stop Time 1020   ? PT Time Calculation (min) 42 min   ? Activity Tolerance Patient tolerated treatment well   ? Behavior During Therapy [facility] for tasks assessed/performed   ? ?  ?  ? ?  ? ? ?Past Medical History:  ?Diagnosis Date  ? Arthritis   ? Closed fracture of fifth metatarsal bone 05/13/2018  ? Deep vein thrombosis ([place])   ? right calf - 05/2012   ? Diabetes mellitus without complication ([place])   ? diet controlled   ? GERD (gastroesophageal reflux disease)   ? Hyperlipidemia   ? Hypertension   ? Ejection fraction =>55% Left ventricular systolic function is normal. Left ventricular wall motion is normal    ? Lymphocytic colitis   ? Neuropathy   ? diabetic - in bilateral feet   ? Pityriasis lichenoides chronica   ? Sleep apnea   ? bipap  ? ? ?Past Surgical History:  ?Procedure Laterality Date  ? DILATION AND CURETTAGE OF UTERUS    ? EYE SURGERY  07/2020  ? HAMMER TOE SURGERY    ? right hand surgery     ? due to blood infection   ? torn meniscus repair     ? right knee   ? TOTAL HIP ARTHROPLASTY  09/13/2012  ? Procedure: TOTAL HIP ARTHROPLASTY ANTERIOR APPROACH;  Surgeon: Mauri Pole, MD;  Location: [facility];  Service: Orthopedics;  Laterality: Right;  ? ? ?There were no vitals filed for this visit. ? ? Subjective Assessment - 10/20/21 0941   ? ? Subjective My right knee has really been hurting, not sure why, I will have an injection Wednesday afternoon   ? Currently in Pain? Yes    ? Pain Score 6    ? Pain Location Knee   ? Pain Orientation Right   ? Pain Descriptors / Indicators Sore   ? Aggravating Factors  that fall really set me back   ? ?  ?  ? ?  ? ? ? ? ? ? ? ? ? ? ? ? ? ? ? ? ? ? ? ? [place] Adult PT Treatment/Exercise - 10/20/21 0001   ? ?  ? Lumbar Exercises: Supine  ? Clam 20 reps   ? Clam Limitations red tband   ? Bridge with [employer] 20 reps   ? Straight Leg Raise 20 reps;1 second   ? Straight Leg Raises Limitations with TA set/posterior pelvic tilt, low   ? Other Supine Lumbar Exercises posterior pelvic tilt x15 with 3 second holds; marches with posterior pelvic tilt 1x15B, feet on ball K2C, trunk rotaiton, small bridges and isometric abs   ?  ? Knee/Hip Exercises: Aerobic  ? Nustep L4 x 7 min   ?  ? Knee/Hip Exercises: Machines for Strengthening  ? Other Machine 5# straight arm pulls 2x10, 5# biceps 15# triceps   ?  ? Knee/Hip  Exercises: Seated  ? Other Seated Knee/Hip Exercises red tband hip IR with ball b/n knees   ? Other Seated Knee/Hip Exercises green TB anlke DF 1x15, ankle inversion/eversions 1x10 B green TB   ? Hamstring Curl Both;2 sets;10 reps   ? Hamstring Limitations Green band   ? ?  ?  ? ?  ? ? ? ? ? ? ? ? ? ? ? ? PT Short Term Goals - 07/23/21 1514   ? ?  ? PT SHORT TERM GOAL #1  ? Title I with basic HEP   ? Baseline 12/14- almost every day, have been practice walking too but weather has made it hard   ? Time 3   ? Period Weeks   ? Status On-going   ? ?  ?  ? ?  ? ? ? ? PT Long Term Goals - 10/06/21 1103   ? ?  ? PT LONG TERM GOAL #1  ? Title Improve motor strength in BLE to 4+/5, with good endurance to tolerate at least 2 x 20 reps of all strenht exercises.   ? Status Partially Met   ?  ? PT LONG TERM GOAL #2  ? Title Improve 5x sit to stand time to 12 seconds from normal test height surface, in order to improve ability to rise from restaraunt chairs iwthout use of armrests.   ? Status Partially Met   ?  ? PT LONG TERM GOAL #3  ? Title Patient will ambulate  functionally x at least 500', on level and unlevel surfaces with LRAD, MI.   ? Status On-going   ?  ? PT LONG TERM GOAL #4  ? Title Patient will tolerate sitting/ standing activities during 45 minute treatment, with maximum of 4 rest breaks, with no C/O back pain or fatigue.   ? Status Partially Met   ?  ? PT LONG TERM GOAL #5  ? Title Up and down at least 5 steps with B rails, MI   ? Status Partially Met   ?  ? PT LONG TERM GOAL #6  ? Title Will improve Berg score to at least 38 in order to show improved functional balance skills/reduced fall risk   ? Status Partially Met   ? ?  ?  ? ?  ? ? ? ? ? ? ? ? Plan - 10/20/21 1022   ? ? Clinical Impression Statement Patient somewhat limited by pain in the right knee, she had a fall about a month ago that has really set her back to some extent.  She had an injection in the knee last week and will have a nother this week.  I am trying to avoid a lot of stress on the knee, so working some increased core activity   ? PT Next Visit Plan continue to progress as she had a set back with the fall   ? Consulted and Agree with Plan of Care Patient   ? ?  ?  ? ?  ? ? ?[name]:  Abnormal gait, Decreased balance, Decreased endurance, Decreased mobility, Difficulty walking, Impaired sensation, Improper body mechanics, Cardiopulmonary status limiting activity, Decreased activity tolerance, Decreased strength, Pain, Postural dysfunction ? ?Visit Diagnosis: ?Difficulty in walking, not elsewhere classified ? ?Muscle weakness (generalized) ? ?Other lack of coordination ? ? ? ? ?Problem List ?Patient Active Problem List  ? Diagnosis Date Noted  ? Chronic hiccups 05/01/2021  ?  Memory loss 05/01/2021  ? History of COVID-19 05/01/2021  ? Olfactory impairment 05/01/2021  ? Senile purpura ([place]) 11/15/2020  ? Difficulty with speech 07/23/2020  ? SOB (shortness of breath) 07/23/2020  ?  Dyslipidemia 11/12/2019  ? Pityriasis lichenoides chronica 09/18/2019  ? Insomnia 08/07/2019  ? Anxiety 08/07/2019  ? Psoriatic arthritis ([place]) -Dr. Trudie Reed 08/07/2019  ? Gout 08/07/2019  ? Recurrent UTI 08/07/2019  ? Charcot's joint of left foot 08/07/2019  ? Depression 08/07/2019  ? Pneumonia due to COVID-19 virus 07/09/2019  ? Physical deconditioning 07/08/2019  ? COVID-19 07/07/2019  ? Diabetic peripheral neuropathy ([place]) 04/10/2019  ? Pain in left knee 10/13/2018  ? Ischemic colitis ([place]) 10/06/2018  ? Urinary incontinence 07/21/2018  ? Diabetic foot ulcer ([place]) 07/13/2018  ? DM2 (diabetes mellitus, type 2) ([place]) 05/02/2018  ? Psoriasis 05/02/2018  ? Osteoarthritis of knee 10/18/2017  ? Right bundle branch block 05/20/2014  ? OSA (obstructive sleep apnea), BiPAP 03/12/2013  ? Essential hypertension 03/12/2013  ? Obesity 09/14/2012  ? Hyponatremia 09/14/2012  ? S/P right THA, AA 09/13/2012  ? Deep venous thrombosis of lower extremity ([place]) 08/10/2010  ? 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 ? Sumner Boast, PT ?10/20/2021, 10:24 AM ? ?[place] ?[address] ?[place], [place], [address] ?Phone: [phone]   Fax:  [phone] ? ?Name: [name] [name] ?MRN: [id number] ?Date of Birth: November 10, 1945 ? ? ? ?

## 2021-10-21 ENCOUNTER — Ambulatory Visit (INDEPENDENT_AMBULATORY_CARE_PROVIDER_SITE_OTHER): Payer: Medicare Other | Admitting: Podiatry

## 2021-10-21 ENCOUNTER — Encounter: Payer: Self-pay | Admitting: Podiatry

## 2021-10-21 DIAGNOSIS — M79675 Pain in left toe(s): Secondary | ICD-10-CM

## 2021-10-21 DIAGNOSIS — B351 Tinea unguium: Secondary | ICD-10-CM | POA: Diagnosis not present

## 2021-10-21 DIAGNOSIS — M79674 Pain in right toe(s): Secondary | ICD-10-CM | POA: Diagnosis not present

## 2021-10-21 DIAGNOSIS — E1142 Type 2 diabetes mellitus with diabetic polyneuropathy: Secondary | ICD-10-CM | POA: Diagnosis not present

## 2021-10-21 DIAGNOSIS — Z20822 Contact with and (suspected) exposure to covid-19: Secondary | ICD-10-CM | POA: Diagnosis not present

## 2021-10-21 DIAGNOSIS — M14672 Charcot's joint, left ankle and foot: Secondary | ICD-10-CM

## 2021-10-21 DIAGNOSIS — I739 Peripheral vascular disease, unspecified: Secondary | ICD-10-CM

## 2021-10-21 NOTE — Progress Notes (Signed)
This patient returns to my office for at risk foot care.  This patient requires this care by a professional since this patient will be at risk due to having peripheral neuropathy. This patient is unable to cut nails herself since the patient cannot reach her nails.These nails are painful walking and wearing shoes.  This patient presents for at risk foot care today. Previous foot surgery for Charcot foot by Liz Malady. ? ?General Appearance  Alert, conversant and in no acute stress. ? ?Vascular  Dorsalis pedis and posterior tibial  pulses are weakly  palpable  bilaterally.  Capillary return is within normal limits  bilaterally. Cold feet.  bilaterally. ? ?Neurologic  Senn-Weinstein monofilament wire test diminished  bilaterally. Muscle power within normal limits bilaterally. ? ?Nails Thick disfigured discolored nails with subungual debris  from hallux to fifth toes bilaterally. No evidence of bacterial infection or drainage bilaterally. ? ?Orthopedic  No limitations of motion  feet .  No crepitus or effusions noted.  No bony pathology or digital deformities noted. Charcot foot left foot. ? ?Skin  normotropic skin with no porokeratosis noted bilaterally.  No signs of infections or ulcers noted.    ? ?Onychomycosis  Pain in right toes  Pain in left toes ? ?Consent was obtained for treatment procedures.   Mechanical debridement of nails 1-5  bilaterally performed with a nail nipper.  Filed with dremel without incident.  ? ? ?Return office visit    3 months                  Told patient to return for periodic foot care and evaluation due to potential at risk complications. ? ? ?Gardiner Barefoot DPM   ?

## 2021-10-22 ENCOUNTER — Ambulatory Visit: Payer: Medicare Other | Admitting: Physical Therapy

## 2021-10-22 ENCOUNTER — Other Ambulatory Visit: Payer: Self-pay

## 2021-10-22 ENCOUNTER — Encounter: Payer: Self-pay | Admitting: Physical Therapy

## 2021-10-22 DIAGNOSIS — R278 Other lack of coordination: Secondary | ICD-10-CM

## 2021-10-22 DIAGNOSIS — M6281 Muscle weakness (generalized): Secondary | ICD-10-CM | POA: Diagnosis not present

## 2021-10-22 DIAGNOSIS — M1711 Unilateral primary osteoarthritis, right knee: Secondary | ICD-10-CM | POA: Diagnosis not present

## 2021-10-22 DIAGNOSIS — M1712 Unilateral primary osteoarthritis, left knee: Secondary | ICD-10-CM | POA: Diagnosis not present

## 2021-10-22 DIAGNOSIS — R262 Difficulty in walking, not elsewhere classified: Secondary | ICD-10-CM

## 2021-10-22 DIAGNOSIS — M170 Bilateral primary osteoarthritis of knee: Secondary | ICD-10-CM | POA: Diagnosis not present

## 2021-10-22 NOTE — Therapy (Signed)
[facility] ?[address] ?[place], [place], [address] ?Phone: [phone]   Fax:  [phone] ? ?Physical Therapy Treatment ? ?Patient Details  ?Name: [name] [name] ?MRN: [id number] ?Date of Birth: 04-28-1946 ?Referring Provider (PT): Lazaro Arms ? ? ?Encounter Date: 10/22/2021 ? ? PT End of Session - 10/22/21 1038   ? ? Visit Number 36   ? Date for PT Re-Evaluation 11/08/21   ? Authorization Type MCR and BCBS   ? Authorization Time Period 18 visits for 2023   ? PT Start Time [phone]   ? PT Stop Time 1020   ? PT Time Calculation (min) 42 min   ? Activity Tolerance Patient tolerated treatment well   ? Behavior During Therapy [facility] for tasks assessed/performed   ? ?  ?  ? ?  ? ? ?Past Medical History:  ?Diagnosis Date  ? Arthritis   ? Closed fracture of fifth metatarsal bone 05/13/2018  ? Deep vein thrombosis ([place])   ? right calf - 05/2012   ? Diabetes mellitus without complication ([place])   ? diet controlled   ? GERD (gastroesophageal reflux disease)   ? Hyperlipidemia   ? Hypertension   ? Ejection fraction =>55% Left ventricular systolic function is normal. Left ventricular wall motion is normal    ? Lymphocytic colitis   ? Neuropathy   ? diabetic - in bilateral feet   ? Pityriasis lichenoides chronica   ? Sleep apnea   ? bipap  ? ? ?Past Surgical History:  ?Procedure Laterality Date  ? DILATION AND CURETTAGE OF UTERUS    ? EYE SURGERY  07/2020  ? HAMMER TOE SURGERY    ? right hand surgery     ? due to blood infection   ? torn meniscus repair     ? right knee   ? TOTAL HIP ARTHROPLASTY  09/13/2012  ? Procedure: TOTAL HIP ARTHROPLASTY ANTERIOR APPROACH;  Surgeon: Mauri Pole, MD;  [facility];  Service: Orthopedics;  Laterality: Right;  ? ? ?There were no vitals filed for this visit. ? ? Subjective Assessment - 10/22/21 0946   ? ? Subjective Feeling a little better than Monday, changed a medication to help the dizziness   ? Currently in Pain? No/denies   ? ?  ?  ? ?   ? ? ? ? ? ? ? ? ? ? ? ? ? ? ? ? ? ? ? ? [place] Adult PT Treatment/Exercise - 10/22/21 0001   ? ?  ? Ambulation/Gait  ? Gait Comments gait in the clinic, no device, very light HHA 100' and then 150 '   ?  ? Lumbar Exercises: Aerobic  ? Nustep level 5 x 8 minues   ?  ? Lumbar Exercises: Standing  ? Row Both;20 reps;Theraband   ? Theraband Level (Row) Level 3 (Green)   ? Shoulder Extension Both;20 reps;Theraband   ? Theraband Level (Shoulder Extension) Level 3 (Green)   ?  ? Lumbar Exercises: Supine  ? Clam 20 reps   ? Clam Limitations green tband   ? Straight Leg Raise 20 reps;1 second   ? Straight Leg Raises Limitations with TA set/posterior pelvic tilt, low   ? Other Supine Lumbar Exercises feet on ball K2C, trunk rotation, small bridges and isometric abs 2x10   ?  ? Knee/Hip Exercises: Machines for Strengthening  ? Other Machine 5# straight arm pulls 2x10, 5# biceps 15# triceps   ?  ? Knee/Hip Exercises:  Standing  ? Hip Abduction Both;2 sets;10 reps   ? Abduction Limitations 2.5#   ?  ? Knee/Hip Exercises: Seated  ? Marching Both;20 reps   ? Hamstring Curl Both;2 sets;10 reps   ? Hamstring Limitations Green band   ? ?  ?  ? ?  ? ? ? ? ? ? ? ? ? ? ? ? PT Short Term Goals - 07/23/21 1514   ? ?  ? PT SHORT TERM GOAL #1  ? Title I with basic HEP   ? Baseline 12/14- almost every day, have been practice walking too but weather has made it hard   ? Time 3   ? Period Weeks   ? Status On-going   ? ?  ?  ? ?  ? ? ? ? PT Long Term Goals - 10/22/21 1042   ? ?  ? PT LONG TERM GOAL #2  ? Title Improve 5x sit to stand time to 12 seconds from normal test height surface, in order to improve ability to rise from restaraunt chairs iwthout use of armrests.   ? Status Partially Met   ?  ? PT LONG TERM GOAL #3  ? Title Patient will ambulate functionally x at least 500', on level and unlevel surfaces with LRAD, MI.   ? Status Partially Met   ? ?  ?  ? ?  ? ? ? ? ? ? ? ? Plan - 10/22/21 1039   ? ? Clinical Impression Statement Patient will  have her last knee injection this afternoon.  She did hit her right shin on something this AM and has an open wound here.  We worked some on walking without device, continued to work on her core, I tried seated marches with weight and had a very loud pop in the low back everytime she lifted the right leg, we did it in standing and she had the same thing, I was able to palpate and it seemed to originate at the T/L junction, she denied pain, but one thought was the need for better core stability vs tightness in the area as she feels tight here   ? PT Next Visit Plan if her knees are feeling better continue to progress to the standing and movement activities, could focus on the core strength as well   ? Consulted and Agree with Plan of Care Patient   ? ?  ?  ? ?  ? ? ?[name]:  Abnormal gait, Decreased balance, Decreased endurance, Decreased mobility, Difficulty walking, Impaired sensation, Improper body mechanics, Cardiopulmonary status limiting activity, Decreased activity tolerance, Decreased strength, Pain, Postural dysfunction ? ?Visit Diagnosis: ?Difficulty in walking, not elsewhere classified ? ?Muscle weakness (generalized) ? ?Other lack of coordination ? ? ? ? ?Problem List ?Patient Active Problem List  ? Diagnosis Date Noted  ? Pain due to onychomycosis of toenails of both feet 10/21/2021  ? Chronic hiccups 05/01/2021  ? Memory loss 05/01/2021  ? History of COVID-19 05/01/2021  ? Olfactory impairment 05/01/2021  ? Senile purpura ([place]) 11/15/2020  ? Difficulty with speech 07/23/2020  ? SOB (shortness of breath) 07/23/2020  ? Dyslipidemia 11/12/2019  ? Pityriasis lichenoides chronica 09/18/2019  ? Insomnia 08/07/2019  ? Anxiety 08/07/2019  ? Psoriatic arthritis ([place]) -Dr. Trudie Reed 08/07/2019  ? Gout 08/07/2019  ? Recurrent UTI 08/07/2019  ? Charcot's joint of left foot 08/07/2019  ? Depression 08/07/2019   ? Pneumonia  due to COVID-19 virus 07/09/2019  ? Physical deconditioning 07/08/2019  ? COVID-19 07/07/2019  ? Diabetic peripheral neuropathy ([place]) 04/10/2019  ? Pain in left knee 10/13/2018  ? Ischemic colitis ([place]) 10/06/2018  ? Urinary incontinence 07/21/2018  ? Diabetic foot ulcer ([place]) 07/13/2018  ? DM2 (diabetes mellitus, type 2) ([place]) 05/02/2018  ? Psoriasis 05/02/2018  ? Osteoarthritis of knee 10/18/2017  ? Right bundle branch block 05/20/2014  ? OSA (obstructive sleep apnea), BiPAP 03/12/2013  ? Essential hypertension 03/12/2013  ? Obesity 09/14/2012  ? Hyponatremia 09/14/2012  ? S/P right THA, AA 09/13/2012  ? Deep venous thrombosis of lower extremity ([place]) 08/10/2010  ? 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 ? Sumner Boast, PT ?10/22/2021, 10:43 AM ? ?[facility] ?[address] ?[place], [place], [address] ?Phone: [phone]   Fax:  [phone] ? ?Name: [name] [name] ?MRN: [id number] ?Date of Birth: 08/14/1945 ? ? ? ?

## 2021-10-26 ENCOUNTER — Encounter: Payer: Self-pay | Admitting: Cardiovascular Disease

## 2021-10-27 ENCOUNTER — Ambulatory Visit: Payer: Medicare Other | Admitting: Physical Therapy

## 2021-10-29 ENCOUNTER — Ambulatory Visit: Payer: Medicare Other | Admitting: Physical Therapy

## 2021-10-30 ENCOUNTER — Ambulatory Visit: Payer: Medicare Other | Admitting: Physical Therapy

## 2021-10-30 DIAGNOSIS — L4059 Other psoriatic arthropathy: Secondary | ICD-10-CM | POA: Diagnosis not present

## 2021-11-03 ENCOUNTER — Encounter: Payer: Self-pay | Admitting: Cardiovascular Disease

## 2021-11-03 ENCOUNTER — Ambulatory Visit: Payer: Medicare Other | Admitting: Physical Therapy

## 2021-11-03 DIAGNOSIS — M1711 Unilateral primary osteoarthritis, right knee: Secondary | ICD-10-CM | POA: Diagnosis not present

## 2021-11-05 ENCOUNTER — Other Ambulatory Visit: Payer: Self-pay

## 2021-11-05 ENCOUNTER — Ambulatory Visit: Payer: Medicare Other | Admitting: Physical Therapy

## 2021-11-05 ENCOUNTER — Encounter: Payer: Self-pay | Admitting: Physical Therapy

## 2021-11-05 DIAGNOSIS — R278 Other lack of coordination: Secondary | ICD-10-CM

## 2021-11-05 DIAGNOSIS — M6281 Muscle weakness (generalized): Secondary | ICD-10-CM | POA: Diagnosis not present

## 2021-11-05 DIAGNOSIS — R262 Difficulty in walking, not elsewhere classified: Secondary | ICD-10-CM | POA: Diagnosis not present

## 2021-11-05 NOTE — Therapy (Signed)
[facility] ?[name] ?[place], [place], [address] ?Phone: [phone]   Fax:  [phone] ? ?Physical Therapy Treatment ? ?Patient Details  ?Name: [name] [name] ?MRN: [id number] ?Date of Birth: 1946-02-02 ?Referring Provider (PT): Lazaro Arms ? ? ?Encounter Date: 11/05/2021 ? ? PT End of Session - 11/05/21 1034   ? ? Visit Number [id number]   ? Date for PT Re-Evaluation 11/08/21   ? Authorization Type MCR and BCBS   ? PT Start Time 6962   ? PT Stop Time 1023   ? PT Time Calculation (min) 48 min   ? Activity Tolerance Patient tolerated treatment well   ? Behavior During Therapy [facility] for tasks assessed/performed   ? ?  ?  ? ?  ? ? ?Past Medical History:  ?Diagnosis Date  ? Arthritis   ? Closed fracture of fifth metatarsal bone 05/13/2018  ? Deep vein thrombosis ([place])   ? right calf - 05/2012   ? Diabetes mellitus without complication ([place])   ? diet controlled   ? GERD (gastroesophageal reflux disease)   ? Hyperlipidemia   ? Hypertension   ? Ejection fraction =>55% Left ventricular systolic function is normal. Left ventricular wall motion is normal    ? Lymphocytic colitis   ? Neuropathy   ? diabetic - in bilateral feet   ? Pityriasis lichenoides chronica   ? Sleep apnea   ? bipap  ? ? ?Past Surgical History:  ?Procedure Laterality Date  ? DILATION AND CURETTAGE OF UTERUS    ? EYE SURGERY  07/2020  ? HAMMER TOE SURGERY    ? right hand surgery     ? due to blood infection   ? torn meniscus repair     ? right knee   ? TOTAL HIP ARTHROPLASTY  09/13/2012  ? Procedure: TOTAL HIP ARTHROPLASTY ANTERIOR APPROACH;  Surgeon: Mauri Pole, MD;  [facility];  Service: Orthopedics;  Laterality: Right;  ? ? ?There were no vitals filed for this visit. ? ? Subjective Assessment - 11/05/21 0946   ? ? Subjective Patient was able to go on a vacation last week, first time since covid, she reports that she did okay, reports that she had a long walk at one spot and was not sure she was  going to make it reports that she would not have been able to do this if she had not been doing PT   ? Currently in Pain? No/denies   ? ?  ?  ? ?  ? ? ? ? ? ? ? ? ? ? ? ? ? ? ? ? ? ? ? ? [facility] Adult PT Treatment/Exercise - 11/05/21 0001   ? ?  ? Ambulation/Gait  ? Gait Comments outside and around the [place] to her car with [facility]   ?  ? Lumbar Exercises: Aerobic  ? Nustep level 5 x 8 minues   ?  ? Lumbar Exercises: Supine  ? Clam 20 reps   ? Clam Limitations green tband   ? Other Supine Lumbar Exercises feet on ball K2C, trunk rotation, small bridges and isometric abs 2x10   ?  ? Knee/Hip Exercises: Machines for Strengthening  ? Cybex Knee Extension 5# 3x10   ? Cybex Knee Flexion 20# 3x10   ? Other Machine 5# straight arm pulls 2x10, 5# biceps 15# triceps   ?  ? Knee/Hip Exercises: Seated  ? Ball Squeeze 20   ? Clamshell with [employer]  Red   ? Other Seated Knee/Hip Exercises red tband hip IR with ball b/n knees   ? Other Seated Knee/Hip Exercises green TB anlke DF 1x15, ankle inversion/eversions 1x10 B green TB   ? Hamstring Curl Both;2 sets;10 reps   ? Hamstring Limitations Green band   ? ?  ?  ? ?  ? ? ? ? ? ? ? ? ? ? ? ? PT Short Term Goals - 07/23/21 1514   ? ?  ? PT SHORT TERM GOAL #1  ? Title I with basic HEP   ? Baseline 12/14- almost every day, have been practice walking too but weather has made it hard   ? Time 3   ? Period Weeks   ? Status On-going   ? ?  ?  ? ?  ? ? ? ? PT Long Term Goals - 11/05/21 1037   ? ?  ? PT LONG TERM GOAL #1  ? Title Improve motor strength in BLE to 4+/5, with good endurance to tolerate at least 2 x 20 reps of all strenht exercises.   ? Status Partially Met   ?  ? PT LONG TERM GOAL #2  ? Title Improve 5x sit to stand time to 12 seconds from normal test height surface, in order to improve ability to rise from restaraunt chairs iwthout use of armrests.   ? Status Partially Met   ?  ? PT LONG TERM GOAL #3  ? Title Patient will ambulate functionally x at least 500', on  level and unlevel surfaces with LRAD, MI.   ? Status Partially Met   ? ?  ?  ? ?  ? ? ? ? ? ? ? ? Plan - 11/05/21 1035   ? ? Clinical Impression Statement Patient was able to go on a vacation last week, reports that she did well, had one time where she really struggled and did not think she was going to make it, had a long walk to get to the rental car area.  She reports no falls.  Reports that she was very tired but was very happy that she could go.  Had one instance with me today where she had some balance issues   ? PT Next Visit Plan work on balance and functional mobility   ? Consulted and Agree with Plan of Care Patient   ? ?  ?  ? ?  ? ? ?[name]:  Abnormal gait, Decreased balance, Decreased endurance, Decreased mobility, Difficulty walking, Impaired sensation, Improper body mechanics, Cardiopulmonary status limiting activity, Decreased activity tolerance, Decreased strength, Pain, Postural dysfunction ? ?Visit Diagnosis: ?Difficulty in walking, not elsewhere classified ? ?Muscle weakness (generalized) ? ?Other lack of coordination ? ? ? ? ?Problem List ?Patient Active Problem List  ? Diagnosis Date Noted  ? Pain due to onychomycosis of toenails of both feet 10/21/2021  ? Chronic hiccups 05/01/2021  ? Memory loss 05/01/2021  ? History of COVID-19 05/01/2021  ? Olfactory impairment 05/01/2021  ? Senile purpura ([place]) 11/15/2020  ? Difficulty with speech 07/23/2020  ? SOB (shortness of breath) 07/23/2020  ? Dyslipidemia 11/12/2019  ? Pityriasis lichenoides chronica 09/18/2019  ? Insomnia 08/07/2019  ? Anxiety 08/07/2019  ? Psoriatic arthritis ([place]) -Dr. Trudie Reed 08/07/2019  ? Gout 08/07/2019  ? Recurrent UTI 08/07/2019  ? Charcot's joint of left foot 08/07/2019  ? Depression 08/07/2019  ? Pneumonia due to COVID-19 virus  07/09/2019  ? Physical deconditioning 07/08/2019  ? COVID-19 07/07/2019  ? Diabetic  peripheral neuropathy ([place]) 04/10/2019  ? Pain in left knee 10/13/2018  ? Ischemic colitis ([place]) 10/06/2018  ? Urinary incontinence 07/21/2018  ? Diabetic foot ulcer ([place]) 07/13/2018  ? DM2 (diabetes mellitus, type 2) ([place]) 05/02/2018  ? Psoriasis 05/02/2018  ? Osteoarthritis of knee 10/18/2017  ? Right bundle branch block 05/20/2014  ? OSA (obstructive sleep apnea), BiPAP 03/12/2013  ? Essential hypertension 03/12/2013  ? Obesity 09/14/2012  ? Hyponatremia 09/14/2012  ? S/P right THA, AA 09/13/2012  ? Deep venous thrombosis of lower extremity ([place]) 08/10/2010  ? 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 ? Sumner Boast, PT ?11/05/2021, 10:37 AM ? ?[place] ?[address] ?[place], [place], [address] ?Phone: [phone]   Fax:  [phone] ? ?Name: [name] [name] ?MRN: [id number] ?Date of Birth: August 10, 1946 ? ? ? ?

## 2021-11-07 ENCOUNTER — Ambulatory Visit: Payer: Medicare Other | Admitting: Physical Therapy

## 2021-11-07 ENCOUNTER — Telehealth: Payer: Self-pay | Admitting: Internal Medicine

## 2021-11-07 ENCOUNTER — Other Ambulatory Visit: Payer: Self-pay | Admitting: Internal Medicine

## 2021-11-07 DIAGNOSIS — E119 Type 2 diabetes mellitus without complications: Secondary | ICD-10-CM | POA: Insufficient documentation

## 2021-11-07 MED ORDER — BASAGLAR KWIKPEN 100 UNIT/ML ~~LOC~~ SOPN: 25.0000 [IU] | PEN_INJECTOR | Freq: Two times a day (BID) | SUBCUTANEOUS | 0 refills | Status: DC

## 2021-11-07 NOTE — Telephone Encounter (Signed)
Message left for patient today 

## 2021-11-07 NOTE — Telephone Encounter (Signed)
PT calls today regarding their insulin medication. PT had visited their pharmacy today and was informed that the Levemir Flextouch Pen is not being manufactured anymore. PT only has enough of the current insulin for 3 days and pharmacy wanted and ok from Dr.Burns (or another provider since she is away for the day) before suggesting a replacement. PT also states the pharmacy was supposed to be getting up with Angela Nevin or Dr.Burns about this but she wasn't sure if they had called.  ? ?CB: [phone] ?

## 2021-11-10 ENCOUNTER — Encounter: Payer: Self-pay | Admitting: Internal Medicine

## 2021-11-13 DIAGNOSIS — M5136 Other intervertebral disc degeneration, lumbar region: Secondary | ICD-10-CM | POA: Diagnosis not present

## 2021-11-13 DIAGNOSIS — M5451 Vertebrogenic low back pain: Secondary | ICD-10-CM | POA: Diagnosis not present

## 2021-11-13 DIAGNOSIS — M5459 Other low back pain: Secondary | ICD-10-CM | POA: Diagnosis not present

## 2021-11-14 ENCOUNTER — Ambulatory Visit: Payer: Medicare Other | Admitting: Physical Therapy

## 2021-11-16 ENCOUNTER — Other Ambulatory Visit: Payer: Self-pay | Admitting: Internal Medicine

## 2021-11-19 ENCOUNTER — Other Ambulatory Visit: Payer: Self-pay

## 2021-11-19 MED ORDER — BUDESONIDE ER 9 MG PO CP24: 9.0000 mg | ORAL_CAPSULE | Freq: Every day | ORAL | 1 refills | Status: DC

## 2021-11-20 ENCOUNTER — Other Ambulatory Visit: Payer: Self-pay | Admitting: Internal Medicine

## 2021-11-20 ENCOUNTER — Encounter: Payer: Self-pay | Admitting: Physical Therapy

## 2021-11-20 ENCOUNTER — Ambulatory Visit: Payer: Medicare Other | Attending: Nurse Practitioner | Admitting: Physical Therapy

## 2021-11-20 DIAGNOSIS — R262 Difficulty in walking, not elsewhere classified: Secondary | ICD-10-CM | POA: Diagnosis not present

## 2021-11-20 DIAGNOSIS — M6281 Muscle weakness (generalized): Secondary | ICD-10-CM | POA: Insufficient documentation

## 2021-11-20 DIAGNOSIS — R278 Other lack of coordination: Secondary | ICD-10-CM | POA: Insufficient documentation

## 2021-11-20 NOTE — Therapy (Signed)
[place] ?[place] [address] ?[place], [place], [address] ?Phone: [phone]   Fax:  [phone] ? ?Physical Therapy Treatment ? ?Patient Details  ?Name: [name] [name] ?MRN: [id number] ?Date of Birth: [birth year] ?Referring Provider (PT): [name] [name] ? ? ?Encounter Date: 11/20/2021 ? ?Progress Note ?Reporting Period 09/24/21 to 11/20/21 ? ?See note below for Objective Data and Assessment of Progress/Goals.  ? ?  ? ? PT End of Session - 11/20/21 1111   ? ? Visit Number 38   ? Number of Visits 42   ? Date for PT Re-Evaluation 12/17/21   ? Authorization Type MCR and BCBS   ? Progress Note Due on Visit 48   ? PT Start Time 1030   arrived late  ? PT Stop Time 1056   ? PT Time Calculation (min) 26 min   ? Activity Tolerance Patient tolerated treatment well   ? Behavior During Therapy [facility] for tasks assessed/performed   ? ?  ?  ? ?  ? ? ?Past Medical History:  ?Diagnosis Date  ? Arthritis   ? Closed fracture of fifth metatarsal bone 05/13/2018  ? Deep vein thrombosis ([place])   ? right calf - 05/2012   ? Diabetes mellitus without complication ([place])   ? diet controlled   ? GERD (gastroesophageal reflux disease)   ? Hyperlipidemia   ? Hypertension   ? Ejection fraction =>55% Left ventricular systolic function is normal. Left ventricular wall motion is normal    ? Lymphocytic colitis   ? Neuropathy   ? diabetic - in bilateral feet   ? Pityriasis lichenoides chronica   ? Sleep apnea   ? bipap  ? ? ?Past Surgical History:  ?Procedure Laterality Date  ? DILATION AND CURETTAGE OF UTERUS    ? EYE SURGERY  07/2020  ? HAMMER TOE SURGERY    ? right hand surgery     ? due to blood infection   ? torn meniscus repair     ? right knee   ? TOTAL HIP ARTHROPLASTY  09/13/2012  ? Procedure: TOTAL HIP ARTHROPLASTY ANTERIOR APPROACH;  Surgeon: Mauri Pole, MD;  Location: [facility];  Service: Orthopedics;  Laterality: Right;  ? ? ?There were no vitals filed for this visit. ? ? Subjective Assessment -  11/20/21 1029   ? ? Subjective I was late because of traffic, I didn't leave in time. Last week I had to see my orthopedic again because my pain is so much worse again they think my stenosis is worse, I just couldn't stand it. I'm getting an MRI next week too so I can maybe get another injection deeper. Nothing else going on.   ? Patient Stated Goals Walk without AD for longer distances, improve balance   ? Currently in Pain? Yes   ? Pain Score 4    ? Pain Location Back   ? Pain Orientation Lower;Right;Left   ? Pain Descriptors / Indicators Sharp   ? Pain Type Chronic pain   ? ?  ?  ? ?  ? ? ? ? ? [facility] PT Assessment - 11/20/21 0001   ? ?  ? Assessment  ? Medical Diagnosis debility   ? Referring Provider (PT) [name] [name]   ? Onset Date/Surgical Date --   chronic  ? Next MD Visit unscheduled   ?  ? Precautions  ? Precautions Fall   ?  ? Restrictions  ? Weight Bearing Restrictions No   ?  ?  Balance Screen  ? Has the patient fallen in the past 6 months Yes   ? How many times? 1- fell in doctors office   ? Has the patient had a decrease in activity level because of a fear of falling?  Yes   ? Is the patient reluctant to leave their home because of a fear of falling?  No   ?  ? Home Environment  ? Living Environment Private residence   ?  ? Prior Function  ? Level of Independence Independent   ? Leisure , read, travel, theater   ?  ? Strength  ? Right Hip Flexion 3/5   ? Right Hip Extension 3-/5   ? Right Hip ABduction 3-/5   ? Left Hip Flexion 3/5   ? Left Hip Extension 3-/5   ? Left Hip ABduction 3/5   ? Right Knee Flexion 4+/5   ? Right Knee Extension 4+/5   ? Left Knee Flexion 4+/5   ? Left Knee Extension 4+/5   ? Right Ankle Dorsiflexion 4+/5   ? Right Ankle Plantar Flexion 1/5   ? Left Ankle Dorsiflexion 4+/5   ? Left Ankle Plantar Flexion 1/5   ?  ? Transfers  ? Five time sit to stand comments  21 seconds had to use hands   ?  ? Timed Up and Go Test  ? Normal TUG (seconds) 14   ? ?  ?  ? ?   ? ? ? ? ? ? ? ? ? ? ? ? ? ? ? ? ? ? ? ? ? ? ? ? ? PT Education - 11/20/21 1110   ? ? Education Details objective measures, need to taper down and likely DC soon   ? Person(s) Educated Patient   ? Methods Explanation   ? Comprehension Verbalized understanding   ? ?  ?  ? ?  ? ? ? PT Short Term Goals - 11/20/21 1046   ? ?  ? PT SHORT TERM GOAL #1  ? Title I with basic HEP   ? Baseline 4/13- "irregular"   ? Time 3   ? Period Weeks   ? Status On-going   ? ?  ?  ? ?  ? ? ? ? PT Long Term Goals - 11/20/21 1047   ? ?  ? PT LONG TERM GOAL #1  ? Title Improve motor strength in BLE to 4+/5, with good endurance to tolerate at least 2 x 20 reps of all strenht exercises.   ? Baseline 4/13- no improvement in strength since last measurement   ? Time 6   ? Period Weeks   ? Status On-going   ?  ? PT LONG TERM GOAL #2  ? Title Improve 5x sit to stand time to 12 seconds from normal test height surface, in order to improve ability to rise from restaraunt chairs iwthout use of armrests.   ? Baseline 04/13- 19 BUEs   ? Time 6   ? Period Weeks   ? Status On-going   ?  ? PT LONG TERM GOAL #3  ? Title Patient will ambulate functionally x at least 500', on level and unlevel surfaces with LRAD, MI.   ? Baseline 4/13- community distances, able to get thorugh [employer] and the grocery store   ? Time 6   ? Period Weeks   ? Status Achieved   ?  ? PT LONG TERM GOAL #4  ? Title Patient will tolerate sitting/ standing activities during  45 minute treatment, with maximum of 4 rest breaks, with no C/O back pain or fatigue.   ? Time 6   ? Period Weeks   ? Status On-going   ?  ? PT LONG TERM GOAL #5  ? Title Up and down at least 5 steps with B rails, MI   ? Time 6   ? Period Weeks   ? Status Achieved   ?  ? PT LONG TERM GOAL #6  ? Title Will improve Berg score to at least 38 in order to show improved functional balance skills/reduced fall risk   ? Baseline 04/13- did not have time to check this today   ? Time 6   ? Period Weeks   ? Status On-going   ? ?  ?   ? ?  ? ? ? ? ? ? ? ? Plan - 11/20/21 1112   ? ? Clinical Impression Statement [name] arrives after having been out of clinic for a couple of weeks now- she was on vacation and otherwise has been busy with moving. Overdue for objective measurements, we took these today and unfortunately she does not show significant improvement. Discussed tapering down therapy while helping her to get more active with an exercise program at home or in the gym. She is very hesitant and fearful about this, unfortunately we will need to DC soon given very little progress recently.   ? Personal Factors and Comorbidities Age;Behavior Pattern;Fitness;Time since onset of injury/illness/exacerbation;Past/Current Experience;Comorbidity 1;Comorbidity 2   ? Comorbidities Covid, Foot wound   ? Examination-Activity Limitations Sit;Transfers;Bend;Squat;Stairs;Carry;Stand;Toileting   ? Examination-Participation Restrictions Cleaning;Community Activity;Interpersonal Relationship;Laundry;Other   ? Stability/Clinical Decision Making Evolving/Moderate complexity   ? Clinical Decision Making Moderate   ? Rehab Potential Good   ? PT Frequency 1x / week   ? PT Duration 4 weeks   ? PT Treatment/Interventions ADLs/Self Care Home Management;Cryotherapy;Electrical Stimulation;Moist Heat;DME Lexicographer;Therapeutic exercise;Balance training;Neuromuscular re-education;Patient/family education;Dry needling   ? PT Next Visit Plan work on balance and functional mobility; taper down and DC 5/10, need to get her more comfortable and compliant with home gym program   ? [place]   ? Consulted and Agree with Plan of Care Patient   ? ?  ?  ? ?  ? ? ?[name]:  Abnormal gait, Decreased balance, Decreased endurance, Decreased mobility, Difficulty walking, Impaired sensation, Improper body mechanics, Cardiopulmonary status  limiting activity, Decreased activity tolerance, Decreased strength, Pain, Postural dysfunction ? ?Visit Diagnosis: ?Difficulty in walking, not elsewhere classified ? ?Muscle weakness (generalized) ? ?Other lack of coordination

## 2021-11-24 ENCOUNTER — Ambulatory Visit: Payer: Medicare Other | Admitting: Physical Therapy

## 2021-11-24 DIAGNOSIS — M5451 Vertebrogenic low back pain: Secondary | ICD-10-CM | POA: Diagnosis not present

## 2021-11-24 DIAGNOSIS — M5459 Other low back pain: Secondary | ICD-10-CM | POA: Diagnosis not present

## 2021-11-25 DIAGNOSIS — M71341 Other bursal cyst, right hand: Secondary | ICD-10-CM | POA: Diagnosis not present

## 2021-11-25 DIAGNOSIS — B078 Other viral warts: Secondary | ICD-10-CM | POA: Diagnosis not present

## 2021-11-25 DIAGNOSIS — L400 Psoriasis vulgaris: Secondary | ICD-10-CM | POA: Diagnosis not present

## 2021-11-26 ENCOUNTER — Encounter: Payer: Self-pay | Admitting: Physical Therapy

## 2021-11-26 ENCOUNTER — Ambulatory Visit: Payer: Medicare Other | Admitting: Physical Therapy

## 2021-11-26 DIAGNOSIS — R262 Difficulty in walking, not elsewhere classified: Secondary | ICD-10-CM | POA: Diagnosis not present

## 2021-11-26 DIAGNOSIS — M6281 Muscle weakness (generalized): Secondary | ICD-10-CM | POA: Diagnosis not present

## 2021-11-26 DIAGNOSIS — R278 Other lack of coordination: Secondary | ICD-10-CM | POA: Diagnosis not present

## 2021-11-26 NOTE — Therapy (Signed)
[place] ?[place] ?[address] ?[place], [place], [address] ?Phone: [phone]   Fax:  [phone] ? ?Physical Therapy Treatment ? ?Patient Details  ?Name: [name] [name] ?MRN: [id number] ?Date of Birth: Feb 24, 1946 ?Referring Provider (PT): Lazaro Arms ? ? ?Encounter Date: 11/26/2021 ? ? PT End of Session - 11/26/21 1516   ? ? Visit Number 39   ? Date for PT Re-Evaluation 12/17/21   ? Authorization Type MCR and BCBS   ? Authorization Time Period 21 visits for 2023   ? PT Start Time 1400   ? PT Stop Time 2637   ? PT Time Calculation (min) 45 min   ? Activity Tolerance Patient tolerated treatment well   ? Behavior During Therapy [facility] for tasks assessed/performed   ? ?  ?  ? ?  ? ? ?Past Medical History:  ?Diagnosis Date  ? Arthritis   ? Closed fracture of fifth metatarsal bone 05/13/2018  ? Deep vein thrombosis ([place])   ? right calf - 05/2012   ? Diabetes mellitus without complication ([place])   ? diet controlled   ? GERD (gastroesophageal reflux disease)   ? Hyperlipidemia   ? Hypertension   ? Ejection fraction =>55% Left ventricular systolic function is normal. Left ventricular wall motion is normal    ? Lymphocytic colitis   ? Neuropathy   ? diabetic - in bilateral feet   ? Pityriasis lichenoides chronica   ? Sleep apnea   ? bipap  ? ? ?Past Surgical History:  ?Procedure Laterality Date  ? DILATION AND CURETTAGE OF UTERUS    ? EYE SURGERY  07/2020  ? HAMMER TOE SURGERY    ? right hand surgery     ? due to blood infection   ? torn meniscus repair     ? right knee   ? TOTAL HIP ARTHROPLASTY  09/13/2012  ? Procedure: TOTAL HIP ARTHROPLASTY ANTERIOR APPROACH;  Surgeon: Mauri Pole, MD;  [facility]: [facility];  Service: Orthopedics;  Laterality: Right;  ? ? ?There were no vitals filed for this visit. ? ? Subjective Assessment - 11/26/21 1406   ? ? Subjective Patient reports that she has been doing a lot as she will be moving in about a month, she reports increased LBP, she had an  MRI on Monday on her back   ? Currently in Pain? Yes   ? Pain Score 5    ? Pain Location Back   ? Pain Orientation Lower   ? Aggravating Factors  moving and packing   ? ?  ?  ? ?  ? ? ? ? ? ? ? ? ? ? ? ? ? ? ? ? ? ? ? ? [facility] Adult PT Treatment/Exercise - 11/26/21 0001   ? ?  ? High Level Balance  ? High Level Balance Activities Side stepping;Backward walking;Direction changes   ? High Level Balance Comments cone toe touches   ?  ? Lumbar Exercises: Stretches  ? Passive Hamstring Stretch Right;Left;3 reps;20 seconds   ? Piriformis Stretch Right;Left;3 reps;20 seconds   ?  ? Lumbar Exercises: Aerobic  ? Nustep level 5 x 8 minues   ?  ? Lumbar Exercises: Supine  ? Clam 20 reps   ? Clam Limitations green tband   ? Other Supine Lumbar Exercises feet on ball K2C, trunk rotation, small bridges and isometric abs 2x10   ?  ? Knee/Hip Exercises: Machines for Strengthening  ? Cybex Knee Extension 5# 3x10   ?  Cybex Knee Flexion 20# 3x10   ? Cybex Leg Press 20# 2x10, 10x without weight   ? Other Machine 5# straight arm pulls 2x10, 5# biceps 15# triceps   ? ?  ?  ? ?  ? ? ? ? ? ? ? ? ? ? ? ? PT Short Term Goals - 11/20/21 1046   ? ?  ? PT SHORT TERM GOAL #1  ? Title I with basic HEP   ? Baseline 4/13- "irregular"   ? Time 3   ? Period Weeks   ? Status On-going   ? ?  ?  ? ?  ? ? ? ? PT Long Term Goals - 11/26/21 1520   ? ?  ? PT LONG TERM GOAL #1  ? Title Improve motor strength in BLE to 4+/5, with good endurance to tolerate at least 2 x 20 reps of all strenht exercises.   ? Status Partially Met   ? ?  ?  ? ?  ? ? ? ? ? ? ? ? Plan - 11/26/21 1517   ? ? Clinical Impression Statement Patient had a wedding that she attended last week 3 hours away, reports fatigue with this and her long covid, she is also packing and planning on moving in about 3-4 weeks, this has caused stress and her to do a lot more than normal.  She reports fatigue and a little more back pain   ? PT Next Visit Plan work on balance and functional mobility; taper  down and DC 5/10, need to get her more comfortable and compliant with home gym program   ? Consulted and Agree with Plan of Care Patient   ? ?  ?  ? ?  ? ? ?[name]:  Abnormal gait, Decreased balance, Decreased endurance, Decreased mobility, Difficulty walking, Impaired sensation, Improper body mechanics, Cardiopulmonary status limiting activity, Decreased activity tolerance, Decreased strength, Pain, Postural dysfunction ? ?Visit Diagnosis: ?Difficulty in walking, not elsewhere classified ? ?Muscle weakness (generalized) ? ? ? ? ?Problem List ?Patient Active Problem List  ? Diagnosis Date Noted  ? Insulin-requiring or dependent type II diabetes mellitus ([place]) 11/07/2021  ? Pain due to onychomycosis of toenails of both feet 10/21/2021  ? Chronic hiccups 05/01/2021  ? Memory loss 05/01/2021  ? History of COVID-19 05/01/2021  ? Olfactory impairment 05/01/2021  ? Senile purpura ([place]) 11/15/2020  ? Difficulty with speech 07/23/2020  ? SOB (shortness of breath) 07/23/2020  ? Dyslipidemia 11/12/2019  ? Pityriasis lichenoides chronica 09/18/2019  ? Insomnia 08/07/2019  ? Anxiety 08/07/2019  ? Psoriatic arthritis ([place]) -Dr. Trudie Reed 08/07/2019  ? Gout 08/07/2019  ? Recurrent UTI 08/07/2019  ? Charcot's joint of left foot 08/07/2019  ? Depression 08/07/2019  ? Pneumonia due to COVID-19 virus 07/09/2019  ? Physical deconditioning 07/08/2019  ? COVID-19 07/07/2019  ? Diabetic peripheral neuropathy ([place]) 04/10/2019  ? Pain in left knee 10/13/2018  ? Ischemic colitis ([place]) 10/06/2018  ? Urinary incontinence 07/21/2018  ? Diabetic foot ulcer ([place]) 07/13/2018  ? DM2 (diabetes mellitus, type 2) ([place]) 05/02/2018  ? Psoriasis 05/02/2018  ? Osteoarthritis of knee 10/18/2017  ? Right bundle branch block 05/20/2014  ? OSA (obstructive sleep apnea), BiPAP 03/12/2013  ? Essential hypertension 03/12/2013  ? Obesity 09/14/2012  ?  Hyponatremia 09/14/2012  ? S/P right THA, AA 09/13/2012  ? Deep venous thrombosis of lower extremity ([place]) 08/10/2010  ? 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 ? ?  Sumner Boast, PT ?11/26/2021, 3:20 PM ? ?[place] ?[place] [place] ?[address] ?[place], [place], [address] ?Phone: [phone]   Fax:  [phone] ? ?Name: [name] [name] ?MRN: [id number] ?Date of Birth: 1946-01-04 ? ? ? ?

## 2021-11-27 DIAGNOSIS — Z79899 Other long term (current) drug therapy: Secondary | ICD-10-CM | POA: Diagnosis not present

## 2021-11-27 DIAGNOSIS — L4059 Other psoriatic arthropathy: Secondary | ICD-10-CM | POA: Diagnosis not present

## 2021-12-01 DIAGNOSIS — Z23 Encounter for immunization: Secondary | ICD-10-CM | POA: Diagnosis not present

## 2021-12-02 DIAGNOSIS — M5451 Vertebrogenic low back pain: Secondary | ICD-10-CM | POA: Diagnosis not present

## 2021-12-04 ENCOUNTER — Ambulatory Visit: Payer: Medicare Other | Admitting: Physical Therapy

## 2021-12-04 DIAGNOSIS — M1009 Idiopathic gout, multiple sites: Secondary | ICD-10-CM | POA: Diagnosis not present

## 2021-12-04 DIAGNOSIS — M47816 Spondylosis without myelopathy or radiculopathy, lumbar region: Secondary | ICD-10-CM | POA: Insufficient documentation

## 2021-12-04 DIAGNOSIS — L408 Other psoriasis: Secondary | ICD-10-CM | POA: Diagnosis not present

## 2021-12-04 DIAGNOSIS — E669 Obesity, unspecified: Secondary | ICD-10-CM | POA: Diagnosis not present

## 2021-12-04 DIAGNOSIS — L4059 Other psoriatic arthropathy: Secondary | ICD-10-CM | POA: Diagnosis not present

## 2021-12-04 DIAGNOSIS — Z6831 Body mass index (BMI) 31.0-31.9, adult: Secondary | ICD-10-CM | POA: Diagnosis not present

## 2021-12-04 DIAGNOSIS — K5289 Other specified noninfective gastroenteritis and colitis: Secondary | ICD-10-CM | POA: Diagnosis not present

## 2021-12-04 DIAGNOSIS — Z79899 Other long term (current) drug therapy: Secondary | ICD-10-CM | POA: Diagnosis not present

## 2021-12-11 ENCOUNTER — Ambulatory Visit: Payer: Medicare Other | Attending: Nurse Practitioner | Admitting: Physical Therapy

## 2021-12-11 DIAGNOSIS — R262 Difficulty in walking, not elsewhere classified: Secondary | ICD-10-CM | POA: Insufficient documentation

## 2021-12-11 DIAGNOSIS — R278 Other lack of coordination: Secondary | ICD-10-CM | POA: Insufficient documentation

## 2021-12-11 DIAGNOSIS — M5459 Other low back pain: Secondary | ICD-10-CM | POA: Insufficient documentation

## 2021-12-11 DIAGNOSIS — M6281 Muscle weakness (generalized): Secondary | ICD-10-CM | POA: Insufficient documentation

## 2021-12-11 NOTE — Therapy (Signed)
[place] ?[place] ?[address] ?[place], [place], [address] ?Phone: [phone]   Fax:  [phone] ? ?Patient Details  ?Name: [name] [name] ?MRN: [id number] ?Date of Birth: [birth year] ?Referring Provider:  Binnie Rail, MD ? ?Encounter Date: 12/11/2021 ? ?[name] arrives obviously very angry and upset today.  ? ?She tells me she is extremely mad with me about the reassessment and recommendations to taper down PT.  ? ?I apologized multiple times if anything we discussed came across poorly or in a confusing way to her, and re-explained the purpose of PT in improving strength, function, endurance, etc also that recommendation to taper down PT to home gym program/activity was discussed by multiple therapists in her POC however she continued to hyperfocus on her grievances towards me, I was unable to de-escalate the situation or refocus her today.  ? ?Supervisor at this clinic is aware of the situation.  ? ?She then refused to work with me and left the clinic. No charge for today's session.  ? ?Zahirah Cheslock U PT, DPT, PN2  ? ?Supplemental Physical Therapist ?[place]  ? ? ? ? ? ?[place] ?[address] ?[place], [place], [address] ?Phone: [phone]   Fax:  [phone] ?

## 2021-12-13 ENCOUNTER — Other Ambulatory Visit: Payer: Self-pay | Admitting: Internal Medicine

## 2021-12-15 DIAGNOSIS — Z20822 Contact with and (suspected) exposure to covid-19: Secondary | ICD-10-CM | POA: Diagnosis not present

## 2021-12-17 ENCOUNTER — Ambulatory Visit: Payer: Medicare Other | Admitting: Physical Therapy

## 2021-12-17 ENCOUNTER — Encounter: Payer: Self-pay | Admitting: Physical Therapy

## 2021-12-17 DIAGNOSIS — M6281 Muscle weakness (generalized): Secondary | ICD-10-CM

## 2021-12-17 DIAGNOSIS — R262 Difficulty in walking, not elsewhere classified: Secondary | ICD-10-CM | POA: Diagnosis not present

## 2021-12-17 DIAGNOSIS — R278 Other lack of coordination: Secondary | ICD-10-CM | POA: Diagnosis not present

## 2021-12-17 DIAGNOSIS — M5459 Other low back pain: Secondary | ICD-10-CM | POA: Diagnosis not present

## 2021-12-17 NOTE — Therapy (Signed)
?OUTPATIENT PHYSICAL THERAPY THORACOLUMBAR EVALUATION ? ? ?Patient Name: [name] [name] ?MRN: [id number] ?DOB:July 31, 1946, 76 y.o., female ?Today's Date: 12/17/2021 ? ? PT End of Session - 12/17/21 1211   ? ? Visit Number 40   ? Date for PT Re-Evaluation 02/16/22   ? Authorization Type MCR and BCBS   ? PT Start Time 1100   ? PT Stop Time 1200   ? PT Time Calculation (min) 60 min   ? Activity Tolerance Patient tolerated treatment well   ? Behavior During Therapy [facility] for tasks assessed/performed   ? ?  ?  ? ?  ? ? ?Past Medical History:  ?Diagnosis Date  ? Arthritis   ? Closed fracture of fifth metatarsal bone 05/13/2018  ? Deep vein thrombosis ([place])   ? right calf - 05/2012   ? Diabetes mellitus without complication ([place])   ? diet controlled   ? GERD (gastroesophageal reflux disease)   ? Hyperlipidemia   ? Hypertension   ? Ejection fraction =>55% Left ventricular systolic function is normal. Left ventricular wall motion is normal    ? Lymphocytic colitis   ? Neuropathy   ? diabetic - in bilateral feet   ? Pityriasis lichenoides chronica   ? Sleep apnea   ? bipap  ? ?Past Surgical History:  ?Procedure Laterality Date  ? DILATION AND CURETTAGE OF UTERUS    ? EYE SURGERY  07/2020  ? HAMMER TOE SURGERY    ? right hand surgery     ? due to blood infection   ? torn meniscus repair     ? right knee   ? TOTAL HIP ARTHROPLASTY  09/13/2012  ? Procedure: TOTAL HIP ARTHROPLASTY ANTERIOR APPROACH;  Surgeon: Mauri Pole, MD;  [facility];  Service: Orthopedics;  Laterality: Right;  ? ?Patient Active Problem List  ? Diagnosis Date Noted  ? Insulin-requiring or dependent type II diabetes mellitus ([place]) 11/07/2021  ? Pain due to onychomycosis of toenails of both feet 10/21/2021  ? Chronic hiccups 05/01/2021  ? Memory loss 05/01/2021  ? History of COVID-19 05/01/2021  ? Olfactory impairment 05/01/2021  ? Senile purpura ([place]) 11/15/2020  ? Difficulty with speech 07/23/2020  ? SOB (shortness of breath) 07/23/2020  ? Dyslipidemia  11/12/2019  ? Pityriasis lichenoides chronica 09/18/2019  ? Insomnia 08/07/2019  ? Anxiety 08/07/2019  ? Psoriatic arthritis ([place]) -Dr. Trudie Reed 08/07/2019  ? Gout 08/07/2019  ? Recurrent UTI 08/07/2019  ? Charcot's joint of left foot 08/07/2019  ? Depression 08/07/2019  ? Pneumonia due to COVID-19 virus 07/09/2019  ? Physical deconditioning 07/08/2019  ? COVID-19 07/07/2019  ? Diabetic peripheral neuropathy ([place]) 04/10/2019  ? Pain in left knee 10/13/2018  ? Ischemic colitis ([place]) 10/06/2018  ? Urinary incontinence 07/21/2018  ? Diabetic foot ulcer ([place]) 07/13/2018  ? DM2 (diabetes mellitus, type 2) ([place]) 05/02/2018  ? Psoriasis 05/02/2018  ? Osteoarthritis of knee 10/18/2017  ? Right bundle branch block 05/20/2014  ? OSA (obstructive sleep apnea), BiPAP 03/12/2013  ? Essential hypertension 03/12/2013  ? Obesity 09/14/2012  ? Hyponatremia 09/14/2012  ? S/P right THA, AA 09/13/2012  ? Deep venous thrombosis of lower extremity ([place]) 08/10/2010  ? 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 ? ?PCP: [place] ? ?REFERRING PROVIDER: [place] ? ?REFERRING DIAG: LBP, multifidus atrophy ? ?THERAPY DIAG:  ?Other low back pain ? ?Difficulty in walking, not elsewhere classified ? ?Muscle weakness (generalized) ? ?Other lack of coordination ? ?ONSET DATE: 12/08/21 ? ?SUBJECTIVE:                                                                                                                                                                                          ? ?  SUBJECTIVE STATEMENT: ?Patient reports that LBP is worse over the past few years, she got covid at the beginning, she reports that she has detriorated over time, we have been seeing her for this as well as balance, She had recent MRI that showed the dx below, MD feels she needs to build up the multifidus mms. No stumbles or falls recently.  She is under a lot of stress with a move to a new place.  She has been doing a lot of packing and organizing.  MD gave a back brace.  She  feels tha tthe brace is helping her pain levels ? ?PERTINENT HISTORY:  ?1. Mechanical back pain secondary to multilevel disc degeneration facet arthrosis L4-5 end-stage disc degeneration L3-4 L2-3 T12-L1  ?2. 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 Elevated BMI  ?She has significant neuropathy of her feet, has had wounds on the feet in the past that caused her to not be able to walk, long covid ? ?PAIN:  ?Are you having pain? Yes: NPRS scale: 5/10 ?Pain location: low back  ?Pain description: ache/sharp/ pressure ?Aggravating factors: standing ?Relieving factors: recliner, lying gradual reduction of pain of time  pain a 2-3/10 at best ? ? ?PRECAUTIONS: Fall ? ?FALLS:  ?Has patient fallen in last 6 months? Yes. Number of falls 1 ? ?LIVING ENVIRONMENT: ?Lives with: lives with their family and lives with their spouse ?Lives in: House/apartment ?Stairs: Yes: Internal:   steps; can reach both ?Has following equipment at home: Single point cane ? ?OCCUPATION: retired ? ?PLOF: Independent with household mobility with device  reports has not been able to shop in quite some time due the back pain since covid started ? ?PATIENT GOALS have less pain, feel stronger, walk better, do more ? ? ?OBJECTIVE:  ? ?DIAGNOSTIC FINDINGS:  ?1. Mechanical back pain secondary to multilevel disc degeneration facet arthrosis L4-5 end-stage disc degeneration L3-4 L2-3 T12-L1  ?2. 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 Elevated BMI  ? ? ?SCREENING FOR RED FLAGS: ?Bowel or bladder incontinence: No ?Spinal tumors: No ?Cauda equina syndrome: No ?Compression fracture: No ?Abdominal aneurysm: No ? ?COGNITION: ? Overall cognitive status: Within functional limits for tasks assessed   ?  ?SENSATION: ?WFL above the ankles , below ankles has significant neuropathy ? ? ?MUSCLE LENGTH: ?Good flexibility of the HS, tight piriformis, some tightness and soreness with the piriformis and  lower trunk rotation ? ?POSTURE:  ?Fwd head, rounded shoulders decreased lordosis.   ? ?PALPATION: ?She is very tight and tender in the lumbar paraspinals ? ?LUMBAR ROM:  ? ?Active  A/PROM  ?12/17/2021  ?Flexion Decreased 50%  ?Extension Decreased 50%  ?Right lateral flexion Decreased 50%  ?Left lateral flexion Decreased 50%  ?Right rotation   ?Left rotation   ? (Blank rows = not tested) ? ? ?LE MMT: ? ?MMT Right ?12/17/2021 Left ?12/17/2021  ?Hip flexion 4-/5 4-/5  ?Hip extension 3+/5 3+/5  ?Hip abduction 3+/5 3+/5  ?Hip adduction 4-/5 4-/5  ?Hip internal rotation    ?Hip external rotation    ?Knee flexion    ?Knee extension    ?Ankle dorsiflexion 4-/5 4-/5  ?Ankle plantarflexion    ?Ankle inversion    ?Ankle eversion    ? (Blank rows = not tested) ? ?LUMBAR SPECIAL TESTS:  ?Trendelenberg positive ? ? ?FUNCTIONAL TESTS:  ?5 times sit to stand: 28 seconds ? ?GAIT: ?Distance walked: 200' ?Assistive device utilized: Single point cane ?Level of  assistance: Modified independence ?Comments: due to the neuropathy she is not steady and tends to use walls and furniture ? ? ? ?TODAY'S TREATMENT  ?HEP  Access Code: [email or web address] ?URL: [email or web address] ?Date: 12/17/2021 ?Prepared by: Lum Babe ? ?Exercises ?- Seated Multifidi Isometric  - 1 x daily - 7 x weekly - 2 sets - 10 reps - 3 hold ?- Supine Multifidus with Heel Press  - 1 x daily - 7 x weekly - 2 sets - 10 reps - 3 hold ?- Supine Anterior Pelvic Tilt  - 1 x daily - 7 x weekly - 2 sets - 10 reps - 3 hold ? ? ?PATIENT EDUCATION:  ?Education details: Access Code: [email or web address] ?URL: [email or web address] ?Date: 12/17/2021 ?Prepared by: Lum Babe ? ?Exercises ?- Seated Multifidi Isometric  - 1 x daily - 7 x weekly - 2 sets - 10 reps - 3 hold ?- Supine Multifidus with Heel Press  - 1 x daily - 7 x weekly - 2 sets - 10 reps - 3 hold ?- Supine Anterior Pelvic Tilt  - 1 x daily - 7 x weekly - 2 sets - 10 reps - 3 hold ?Person educated:  Patient ?Education method: Explanation, Demonstration, Tactile cues, Verbal cues, and Handouts ?Education comprehension: verbalized understanding, returned demonstration, verbal cues required, and tactile cues r

## 2021-12-20 DIAGNOSIS — M47816 Spondylosis without myelopathy or radiculopathy, lumbar region: Secondary | ICD-10-CM | POA: Diagnosis not present

## 2021-12-28 DIAGNOSIS — M47816 Spondylosis without myelopathy or radiculopathy, lumbar region: Secondary | ICD-10-CM | POA: Diagnosis not present

## 2021-12-29 ENCOUNTER — Ambulatory Visit: Payer: Medicare Other | Admitting: Physical Therapy

## 2022-01-01 DIAGNOSIS — L4059 Other psoriatic arthropathy: Secondary | ICD-10-CM | POA: Diagnosis not present

## 2022-01-01 DIAGNOSIS — M5136 Other intervertebral disc degeneration, lumbar region: Secondary | ICD-10-CM | POA: Insufficient documentation

## 2022-01-06 ENCOUNTER — Ambulatory Visit: Payer: Medicare Other | Admitting: Physical Therapy

## 2022-01-06 ENCOUNTER — Encounter: Payer: Self-pay | Admitting: Physical Therapy

## 2022-01-06 DIAGNOSIS — M6281 Muscle weakness (generalized): Secondary | ICD-10-CM | POA: Diagnosis not present

## 2022-01-06 DIAGNOSIS — R262 Difficulty in walking, not elsewhere classified: Secondary | ICD-10-CM

## 2022-01-06 DIAGNOSIS — R278 Other lack of coordination: Secondary | ICD-10-CM | POA: Diagnosis not present

## 2022-01-06 DIAGNOSIS — M5459 Other low back pain: Secondary | ICD-10-CM

## 2022-01-06 NOTE — Therapy (Addendum)
OUTPATIENT PHYSICAL THERAPY THORACOLUMBAR EVALUATION   Patient Name: [name] [name] MRN: [id number] DOB:[birth year], 76 y.o., female Today's Date: 01/06/2022   PT End of Session - 01/06/22 1018     Visit Number 43    PT Start Time 2952    PT Stop Time 1100    PT Time Calculation (min) 45 min    Activity Tolerance Patient tolerated treatment well    Behavior During Therapy [facility] for tasks assessed/performed             Past Medical History:  Diagnosis Date   Arthritis    Closed fracture of fifth metatarsal bone 05/13/2018   Deep vein thrombosis ([place])    right calf - 05/2012    Diabetes mellitus without complication (HCC)    diet controlled    GERD (gastroesophageal reflux disease)    Hyperlipidemia    Hypertension    Ejection fraction =>55% Left ventricular systolic function is normal. Left ventricular wall motion is normal     Lymphocytic colitis    Neuropathy    diabetic - in bilateral feet    Pityriasis lichenoides chronica    Sleep apnea    bipap   Past Surgical History:  Procedure Laterality Date   DILATION AND CURETTAGE OF UTERUS     EYE SURGERY  07/2020   HAMMER TOE SURGERY     right hand surgery      due to blood infection    torn meniscus repair      right knee    TOTAL HIP ARTHROPLASTY  09/13/2012   Procedure: TOTAL HIP ARTHROPLASTY ANTERIOR APPROACH;  Surgeon: Mauri Pole, MD;  Location: [facility];  Service: Orthopedics;  Laterality: Right;   Patient Active Problem List   Diagnosis Date Noted   Insulin-requiring or dependent type II diabetes mellitus ([place]) 11/07/2021   Pain due to onychomycosis of toenails of both feet 10/21/2021   Chronic hiccups 05/01/2021   Memory loss 05/01/2021   History of COVID-19 05/01/2021   Olfactory impairment 05/01/2021   Senile purpura ([place]) 11/15/2020   Difficulty with speech 07/23/2020   SOB (shortness of breath) 07/23/2020   Dyslipidemia 84/13/2440   Pityriasis lichenoides chronica 09/18/2019   Insomnia 08/07/2019    Anxiety 08/07/2019   Psoriatic arthritis ([place]) -Dr. Trudie Reed 08/07/2019   Gout 08/07/2019   Recurrent UTI 08/07/2019   Charcot's joint of left foot 08/07/2019   Depression 08/07/2019   Pneumonia due to COVID-19 virus 07/09/2019   Physical deconditioning 07/08/2019   COVID-19 07/07/2019   Diabetic peripheral neuropathy ([place]) 04/10/2019   Pain in left knee 10/13/2018   Ischemic colitis ([place]) 10/06/2018   Urinary incontinence 07/21/2018   Diabetic foot ulcer ([place]) 07/13/2018   DM2 (diabetes mellitus, type 2) ([place]) 05/02/2018   Psoriasis 05/02/2018   Osteoarthritis of knee 10/18/2017   Right bundle branch block 05/20/2014   OSA (obstructive sleep apnea), BiPAP 03/12/2013   Essential hypertension 03/12/2013   Obesity 09/14/2012   Hyponatremia 09/14/2012   S/P right THA, AA 09/13/2012   Deep venous thrombosis of lower extremity ([place]) 08/10/2010   Lymphocytic-plasmacytic colitis 08/11/2007    PCP: Quay Burow  REFERRING PROVIDER: [place]  REFERRING DIAG: LBP, multifidus atrophy  THERAPY DIAG:  Other low back pain  Muscle weakness (generalized)  Difficulty in walking, not elsewhere classified  ONSET DATE: 12/08/21  SUBJECTIVE:  SUBJECTIVE STATEMENT: Always have a little low back pain. Has been recovering form moving. Was unable to get out of bed for two days after moving due to pain and fatigue.   PERTINENT HISTORY:  1. Mechanical back pain secondary to multilevel disc degeneration facet arthrosis L4-5 end-stage disc degeneration L3-4 L2-3 T12-L1  2. Deconditioning and paraspinous muscular atrophy of the multifidus severe  3. Elevated BMI  She has significant neuropathy of her feet, has had wounds on the feet in the past that caused her to not be able to walk, long covid  PAIN:  Are you having  pain? Yes: NPRS scale: 3/10 Pain location: low back  Pain description: sharp Aggravating factors: standing Relieving factors: recliner, lying gradual reduction of pain of time  pain a 2-3/10 at best     OBJECTIVE:   DIAGNOSTIC FINDINGS:  1. Mechanical back pain secondary to multilevel disc degeneration facet arthrosis L4-5 end-stage disc degeneration L3-4 L2-3 T12-L1  2. Deconditioning and paraspinous muscular atrophy of the multifidus severe  3. Elevated BMI    LUMBAR ROM:   Active  A/PROM  Eval  Flexion Decreased 50%  Extension Decreased 50%  Right lateral flexion Decreased 50%  Left lateral flexion Decreased 50%  Right rotation   Left rotation    (Blank rows = not tested)   LE MMT:  MMT Right Eval Left Eval  Hip flexion 4-/5 4-/5  Hip extension 3+/5 3+/5  Hip abduction 3+/5 3+/5  Hip adduction 4-/5 4-/5  Hip internal rotation    Hip external rotation    Knee flexion    Knee extension    Ankle dorsiflexion 4-/5 4-/5  Ankle plantarflexion    Ankle inversion    Ankle eversion     (Blank rows = not tested)    TODAY'S TREATMENT  01/06/2022 NuStep L3 x 6 min Seated rows red 2x10  Standing shoulder Ext red 2x10 Sit to stand mat slightly elevated 2x5 Spine bridges x10 x5  LE on pball bridges, K2C, Oblq x10 x5  SLR 2x10 each  Ab set supine Pball 2x10 Hooklying ball squeezes Hooklying abd green 2x10    PATIENT EDUCATION:  Education details: Access Code: [email or web address] URL: [email or web address] Date: 12/17/2021 Prepared by: Lum Babe  Exercises - Seated Multifidi Isometric  - 1 x daily - 7 x weekly - 2 sets - 10 reps - 3 hold - Supine Multifidus with Heel Press  - 1 x daily - 7 x weekly - 2 sets - 10 reps - 3 hold - Supine Anterior Pelvic Tilt  - 1 x daily - 7 x weekly - 2 sets - 10 reps - 3 hold Person educated: Patient Education method: Explanation, Demonstration, Tactile cues, Verbal cues, and Handouts Education comprehension:  verbalized understanding, returned demonstration, verbal cues required, and tactile cues required    ASSESSMENT:  CLINICAL IMPRESSION: Pt enters feeling well overall. Pt tolerated an initial progression to TE well evident by no subjective reports of increase pain. Session consisted of postural and functional strength. Cues needed to slow down with rows and extensions. Bilateral hip weakness with supine bridges. Cues for core engagement needed with ab sets.   OBJECTIVE IMPAIRMENTS Abnormal gait, cardiopulmonary status limiting activity, decreased activity tolerance, decreased balance, decreased mobility, difficulty walking, decreased ROM, decreased strength, increased muscle spasms, impaired flexibility, improper body mechanics, and pain.   ACTIVITY LIMITATIONS cleaning, community activity, driving, meal prep, laundry, and shopping.   PERSONAL FACTORS Age, Fitness, Time since onset of injury/illness/exacerbation, and 1-2 comorbidities:  are also affecting patient's functional outcome.    REHAB POTENTIAL: Good  CLINICAL DECISION MAKING: Stable/uncomplicated  EVALUATION COMPLEXITY: Low   GOALS: Goals reviewed with patient? Yes  LONG TERM GOALS: Target date: 03/31/2022  (Remove Blue Hyperlink)  Independent with advanced HEP Baseline:  Goal status: INITIAL  2.  Walk 500 feet with SPC and no use of walls for help Baseline:  Goal status: INITIAL  3.  Decreased LBP with walking 50% overall Baseline:  Goal status: INITIAL  4.  Increase LE strength to 4+/5 Baseline:  Goal status: INITIAL   PLAN: PT FREQUENCY: 1-2x/week  PT DURATION: 12 weeks  PLANNED INTERVENTIONS: Therapeutic exercises, Therapeutic activity, Neuromuscular re-education, Balance training, Gait training, Patient/Family education, Joint mobilization, Electrical stimulation, Moist heat, and Manual therapy.  PLAN FOR NEXT SESSION: work on core and overall function 01/12/22:  Patient saw pain MD last week, now  that she is moved in to [facility] she has the ability to use the pool there, he wrote an order for pool therapy.  We will d/C as we talked about this in the past but the biggest limitation was the distance to walk to the pool. Lum Babe, PT     Scot Jun, PTA 01/06/2022, 10:19 AM

## 2022-01-08 ENCOUNTER — Ambulatory Visit: Payer: Medicare Other | Attending: Nurse Practitioner | Admitting: Physical Therapy

## 2022-01-08 ENCOUNTER — Encounter: Payer: Self-pay | Admitting: Physical Therapy

## 2022-01-08 DIAGNOSIS — M5459 Other low back pain: Secondary | ICD-10-CM | POA: Insufficient documentation

## 2022-01-08 DIAGNOSIS — R262 Difficulty in walking, not elsewhere classified: Secondary | ICD-10-CM | POA: Diagnosis not present

## 2022-01-08 DIAGNOSIS — M6281 Muscle weakness (generalized): Secondary | ICD-10-CM | POA: Insufficient documentation

## 2022-01-08 DIAGNOSIS — M5136 Other intervertebral disc degeneration, lumbar region: Secondary | ICD-10-CM | POA: Diagnosis not present

## 2022-01-08 NOTE — Therapy (Addendum)
[place] [place], [place], [address] Phone: [phone]   Fax:  [phone]  Physical Therapy Treatment  Patient Details  Name: [name] [name] MRN: [id number] Date of Birth: Jan 05, 1946 Referring Provider (PT): Lazaro Arms   Encounter Date: 01/08/2022   PT End of Session - 01/08/22 1059     Visit Number 65    Date for PT Re-Evaluation 02/16/22    Authorization Type MCR and BCBS    PT Start Time 1015    PT Stop Time 1100    PT Time Calculation (min) 45 min    Activity Tolerance Patient tolerated treatment well    Behavior During Therapy [facility] for tasks assessed/performed             Past Medical History:  Diagnosis Date   Arthritis    Closed fracture of fifth metatarsal bone 05/13/2018   Deep vein thrombosis ([place])    right calf - 05/2012    Diabetes mellitus without complication (HCC)    diet controlled    GERD (gastroesophageal reflux disease)    Hyperlipidemia    Hypertension    Ejection fraction =>55% Left ventricular systolic function is normal. Left ventricular wall motion is normal     Lymphocytic colitis    Neuropathy    diabetic - in bilateral feet    Pityriasis lichenoides chronica    Sleep apnea    bipap    Past Surgical History:  Procedure Laterality Date   DILATION AND CURETTAGE OF UTERUS     EYE SURGERY  07/2020   HAMMER TOE SURGERY     right hand surgery      due to blood infection    torn meniscus repair      right knee    TOTAL HIP ARTHROPLASTY  09/13/2012   Procedure: TOTAL HIP ARTHROPLASTY ANTERIOR APPROACH;  Surgeon: Mauri Pole, MD;  [facility];  Service: Orthopedics;  Laterality: Right;    There were no vitals filed for this visit.   Subjective Assessment - 01/08/22 1019     Subjective Doing ok    Currently in Pain? Yes    Pain Score 2     Pain Location Back                               [facility] Adult PT Treatment/Exercise - 01/08/22  0001       Ambulation/Gait   Gait Comments Gait outside throught the middle of back island and around L side with SPC. HHAx1 needed with curbs.      High Level Balance   High Level Balance Activities Side stepping      Lumbar Exercises: Stretches   Passive Hamstring Stretch Right;Left;3 reps;20 seconds    Single Knee to Chest Stretch Left;Right;3 reps;10 seconds    Lower Trunk Rotation 3 reps;20 seconds      Lumbar Exercises: Standing   Row Both;20 reps;Theraband    Theraband Level (Row) Level 3 (Green)    Shoulder Extension Both;20 reps;Theraband    Theraband Level (Shoulder Extension) Level 3 (Green)      Lumbar Exercises: Seated   Sit to Stand 5 reps   x3   Other Seated Lumbar Exercises Ab set      Knee/Hip Exercises: Standing   Other Standing Knee Exercises Alt 4in box taps w/ SPC 3x5      Knee/Hip Exercises: Seated   Ball Squeeze  20    Clamshell with TheraBand Red;Green    Hamstring Curl Both;2 sets;15 reps    Hamstring Limitations Green band                       PT Short Term Goals - 11/20/21 1046       PT SHORT TERM GOAL #1   Title I with basic HEP    Baseline 4/13- "irregular"    Time 3    Period Weeks    Status On-going               PT Long Term Goals - 11/26/21 1520       PT LONG TERM GOAL #1   Title Improve motor strength in BLE to 4+/5, with good endurance to tolerate at least 2 x 20 reps of all strenht exercises.    Status Partially Met                   Plan - 01/08/22 1101     Clinical Impression Statement Pt enters feeling well, Session consisted of functional endurance, postural strength, and stretching. Compensation needed with sit to stands, pt LE's does push against the table some. Cue to slow down with standing rows and extensions. Some instability present with side steps.    Personal Factors and Comorbidities Age;Behavior Pattern;Fitness;Time since onset of injury/illness/exacerbation;Past/Current  Experience;Comorbidity 1;Comorbidity 2    Examination-Activity Limitations Sit;Transfers;Bend;Squat;Stairs;Carry;Stand;Toileting    Examination-Participation Restrictions Cleaning;Community Activity;Interpersonal Relationship;Laundry;Other    Stability/Clinical Decision Making Evolving/Moderate complexity    Rehab Potential Good    PT Frequency 1x / week    PT Treatment/Interventions ADLs/Self Care Home Management;Cryotherapy;Electrical Stimulation;Moist Heat;DME Lexicographer;Therapeutic exercise;Balance training;Neuromuscular re-education;Patient/family education;Dry needling    PT Next Visit Plan work on core and overall function             [name]:  Abnormal gait, Decreased balance, Decreased endurance, Decreased mobility, Difficulty walking, Impaired sensation, Improper body mechanics, Cardiopulmonary status limiting activity, Decreased activity tolerance, Decreased strength, Pain, Postural dysfunction  Visit Diagnosis: Other low back pain  Muscle weakness (generalized)  Difficulty in walking, not elsewhere classified     Problem List Patient Active Problem List   Diagnosis Date Noted   Insulin-requiring or dependent type II diabetes mellitus ([place]) 11/07/2021   Pain due to onychomycosis of toenails of both feet 10/21/2021   Chronic hiccups 05/01/2021   Memory loss 05/01/2021   History of COVID-19 05/01/2021   Olfactory impairment 05/01/2021   Senile purpura ([place]) 11/15/2020   Difficulty with speech 07/23/2020   SOB (shortness of breath) 07/23/2020   Dyslipidemia 26/71/2458   Pityriasis lichenoides chronica 09/18/2019   Insomnia 08/07/2019   Anxiety 08/07/2019   Psoriatic arthritis ([place]) -Dr. Trudie Reed 08/07/2019   Gout 08/07/2019   Recurrent UTI 08/07/2019   Charcot's joint of left foot 08/07/2019   Depression 08/07/2019   Pneumonia due to  COVID-19 virus 07/09/2019   Physical deconditioning 07/08/2019   COVID-19 07/07/2019   Diabetic peripheral neuropathy ([place]) 04/10/2019   Pain in left knee 10/13/2018   Ischemic colitis ([place]) 10/06/2018   Urinary incontinence 07/21/2018   Diabetic foot ulcer ([place]) 07/13/2018   DM2 (diabetes mellitus, type 2) ([place]) 05/02/2018   Psoriasis 05/02/2018   Osteoarthritis of knee 10/18/2017   Right bundle branch block 05/20/2014   OSA (obstructive sleep apnea), BiPAP 03/12/2013   Essential hypertension 03/12/2013   Obesity  09/14/2012   Hyponatremia 09/14/2012   S/P right THA, AA 09/13/2012   Deep venous thrombosis of lower extremity ([place]) 08/10/2010   Lymphocytic-plasmacytic colitis 08/11/2007  Discharge patient  she has moved to a new place with the ability to do PT in a pool setting Lum Babe, PT 01/27/22  Scot Jun, PTA 01/08/2022, 11:05 AM  [place]. [place], [place], [address] Phone: [phone]   Fax:  [phone]  Name: [name] [name] MRN: [id number] Date of Birth: [birth year]

## 2022-01-12 ENCOUNTER — Encounter: Payer: Self-pay | Admitting: Internal Medicine

## 2022-01-12 ENCOUNTER — Ambulatory Visit: Payer: Medicare Other | Admitting: Physical Therapy

## 2022-01-12 NOTE — Progress Notes (Unsigned)
Subjective:    Patient ID: [name] [name], female    DOB: 1946/08/09, 76 y.o.   MRN: [id number]      HPI [name] [name] is here for No chief complaint on file.    Cat scratch, infection -     Medications and allergies reviewed with patient and updated if appropriate.  Current Outpatient Medications on File Prior to Visit  Medication Sig Dispense Refill   Accu-Chek Softclix Lancets lancets Use as instructed to check sugars 2-3 times daily.  E11.42 100 each 12   acetaminophen (TYLENOL) 325 MG tablet Take 2 tablets (650 mg total) by mouth every 6 (six) hours as needed for mild pain or headache.     Alcohol Swabs PADS UAD to check sugars.  E11.65 100 each 12   allopurinol (ZYLOPRIM) 100 MG tablet TAKE 1 TABLET(100 MG) BY MOUTH TWICE DAILY 180 tablet 3   ALPRAZolam (XANAX) 0.25 MG tablet TAKE 1 TABLET(0.25 MG) BY MOUTH TWICE DAILY AS NEEDED FOR ANXIETY 20 tablet 0   ascorbic acid (VITAMIN C) 500 MG tablet Take 500 mg by mouth at bedtime.     aspirin 81 MG EC tablet      augmented betamethasone dipropionate (DIPROLENE-AF) 0.05 % cream      azelastine (ASTELIN) 0.1 % nasal spray INSTILL 2 SPRAYS IN EACH NOSTRIL TWICE DAILY 30 mL 12   Blood Glucose Monitoring Suppl (ACCU-CHEK GUIDE) w/Device KIT AS DIRECTED 1 kit 0   Budesonide ER 9 MG CP24 Take 9 mg by mouth daily in the afternoon. 90 capsule 1   carvedilol (COREG) 6.25 MG tablet TAKE 1 TABLET(6.25 MG) BY MOUTH TWICE DAILY WITH A MEAL 60 tablet 3   Certolizumab Pegol (CIMZIA [place]) Inject 400 mg into the skin every 30 (thirty) days. 200 mg on each side of stomach     Cholecalciferol (VITAMIN D3) 1000 UNITS CAPS Take 1,000 Units by mouth 2 (two) times daily.      Cyanocobalamin (VITAMIN B-12) 1000 MCG SUBL Place 1,000 mcg under the tongue daily.     cyclobenzaprine (FLEXERIL) 10 MG tablet TAKE 1 TABLET BY MOUTH AT BEDTIME 90 tablet 1   docusate sodium (COLACE) 100 MG capsule TAKE 1 CAPSULE(100 MG) BY MOUTH TWICE DAILY 60 capsule 2   folic  acid (FOLVITE) 1 MG tablet TAKE 1 TABLET(1 MG) BY MOUTH DAILY 90 tablet 3   glucose blood (ACCU-CHEK GUIDE) test strip Use as instructed to check sugars 2-3 times daily.  E11.42 100 each 12   Insulin Glargine (BASAGLAR KWIKPEN) 100 UNIT/ML Inject 25 Units into the skin 2 (two) times daily. 45 mL 0   Insulin Pen Needle (B-D UF III MINI PEN NEEDLES) 31G X 5 MM MISC Use daily for insulin pen 90 each 3   lisinopril (ZESTRIL) 20 MG tablet Take 1 tablet (20 mg total) by mouth daily. 90 tablet 3   loperamide (IMODIUM A-D) 2 MG tablet Take 1 tablet (2 mg total) by mouth 3 (three) times daily as needed for diarrhea or loose stools. 90 tablet 3   loperamide (IMODIUM) 2 MG capsule Take 6 mg by mouth 2 (two) times daily.     Melatonin 5 MG SUBL Place 5 mg under the tongue at bedtime.     metFORMIN (GLUCOPHAGE-XR) 500 MG 24 hr tablet TAKE 1 TABLET(500 MG) BY MOUTH TWICE DAILY 180 tablet 2   methotrexate (RHEUMATREX) 2.5 MG tablet Take 20 mg by mouth every Friday.      naftifine (NAFTIN) 1 % cream  Apply 1 application. topically daily as needed (rash).     omeprazole (PRILOSEC) 40 MG capsule TAKE 1 CAPSULE BY MOUTH EVERY DAY 90 capsule 1   potassium chloride (KLOR-CON) 10 MEQ tablet Take 3 tablets (30 mEq total) by mouth daily. 270 tablet 3   Probiotic Product (ALIGN PO) Take 1 capsule by mouth daily.     rosuvastatin (CRESTOR) 5 MG tablet TAKE 1 TABLET(5 MG) BY MOUTH DAILY 90 tablet 3   sertraline (ZOLOFT) 100 MG tablet TAKE 1 TABLET(100 MG) BY MOUTH DAILY 90 tablet 1   No current facility-administered medications on file prior to visit.    Review of Systems     Objective:  There were no vitals filed for this visit. BP Readings from Last 3 Encounters:  10/20/21 132/74  10/16/21 113/64  09/17/21 137/65   Wt Readings from Last 3 Encounters:  10/20/21 203 lb (92.1 kg)  10/16/21 205 lb (93 kg)  09/17/21 218 lb (98.9 kg)   There is no height or weight on file to calculate BMI.    Physical Exam          Assessment & Plan:    See Problem List for Assessment and Plan of chronic medical problems.

## 2022-01-13 ENCOUNTER — Other Ambulatory Visit: Payer: Self-pay | Admitting: Internal Medicine

## 2022-01-13 ENCOUNTER — Ambulatory Visit (INDEPENDENT_AMBULATORY_CARE_PROVIDER_SITE_OTHER): Payer: Medicare Other | Admitting: Internal Medicine

## 2022-01-13 VITALS — BP 128/62 | HR 102 | Temp 98.7°F | Ht 65.5 in

## 2022-01-13 DIAGNOSIS — L089 Local infection of the skin and subcutaneous tissue, unspecified: Secondary | ICD-10-CM | POA: Diagnosis not present

## 2022-01-13 DIAGNOSIS — W5503XA Scratched by cat, initial encounter: Secondary | ICD-10-CM | POA: Diagnosis not present

## 2022-01-13 DIAGNOSIS — S60512A Abrasion of left hand, initial encounter: Secondary | ICD-10-CM

## 2022-01-13 DIAGNOSIS — I10 Essential (primary) hypertension: Secondary | ICD-10-CM | POA: Diagnosis not present

## 2022-01-13 MED ORDER — AMOXICILLIN-POT CLAVULANATE 875-125 MG PO TABS: 1.0000 | ORAL_TABLET | Freq: Two times a day (BID) | ORAL | 0 refills | Status: DC

## 2022-01-13 NOTE — Patient Instructions (Addendum)
       Medications changes include :   Augmentin twice daily for 5 days.  Continue to apply the anti-bacterial ointment.     Your prescription(s) have been sent to your pharmacy.      Return if symptoms worsen or fail to improve.

## 2022-01-13 NOTE — Assessment & Plan Note (Signed)
Acute Concern with mild infection secondary to cat scratch on 5/17 Initially the areas seem to be improving, but then worsened and she is having tenderness, increased redness.  No discharge or fluctuance.  No fevers on exam today, but she did have a subjective fever and does not feel well Concern for possible mild infection-we will treat with Augmentin 875-125 mg twice daily x5 days and she will apply antibacterial ointment to the areas Tdap up-to-date She will update me on how this looks over the next several days and we can determine if additional treatment or evaluation is needed I think this will be slow to heal because of the central scab

## 2022-01-13 NOTE — Assessment & Plan Note (Signed)
Chronic Blood pressure well controlled Continue Coreg 6.25 mg twice daily, lisinopril HCT 20-25 mg daily

## 2022-01-14 ENCOUNTER — Ambulatory Visit: Payer: Medicare Other | Admitting: Physical Therapy

## 2022-01-19 ENCOUNTER — Ambulatory Visit: Payer: Medicare Other | Admitting: Physical Therapy

## 2022-01-20 ENCOUNTER — Ambulatory Visit (INDEPENDENT_AMBULATORY_CARE_PROVIDER_SITE_OTHER): Payer: Medicare Other | Admitting: Podiatry

## 2022-01-20 ENCOUNTER — Encounter: Payer: Self-pay | Admitting: Podiatry

## 2022-01-20 DIAGNOSIS — E1142 Type 2 diabetes mellitus with diabetic polyneuropathy: Secondary | ICD-10-CM | POA: Diagnosis not present

## 2022-01-20 DIAGNOSIS — M79675 Pain in left toe(s): Secondary | ICD-10-CM

## 2022-01-20 DIAGNOSIS — M79674 Pain in right toe(s): Secondary | ICD-10-CM | POA: Diagnosis not present

## 2022-01-20 DIAGNOSIS — I739 Peripheral vascular disease, unspecified: Secondary | ICD-10-CM | POA: Diagnosis not present

## 2022-01-20 DIAGNOSIS — B351 Tinea unguium: Secondary | ICD-10-CM | POA: Diagnosis not present

## 2022-01-20 NOTE — Progress Notes (Signed)
This patient returns to my office for at risk foot care.  This patient requires this care by a professional since this patient will be at risk due to having peripheral neuropathy. This patient is unable to cut nails herself since the patient cannot reach her nails.These nails are painful walking and wearing shoes.  This patient presents for at risk foot care today. Previous foot surgery for Charcot foot by Liz Malady.  General Appearance  Alert, conversant and in no acute stress.  Vascular  Dorsalis pedis and posterior tibial  pulses are weakly  palpable  bilaterally.  Capillary return is within normal limits  bilaterally. Cold feet.  bilaterally.  Neurologic  Senn-Weinstein monofilament wire test diminished  bilaterally. Muscle power within normal limits bilaterally.  Nails Thick disfigured discolored nails with subungual debris  from hallux to fifth toes bilaterally. No evidence of bacterial infection or drainage bilaterally.  Orthopedic  No limitations of motion  feet .  No crepitus or effusions noted.  No bony pathology or digital deformities noted. Charcot foot left foot.  Skin  normotropic skin with no porokeratosis noted bilaterally.  No signs of infections or ulcers noted.   Open wound at medial aspect right hallux.  Chronic in nature.  Onychomycosis  Pain in right toes  Pain in left toes  Consent was obtained for treatment procedures.   Mechanical debridement of nails 1-5  bilaterally performed with a nail nipper.  Filed with dremel without incident.    Return office visit    3 months                  Told patient to return for periodic foot care and evaluation due to potential at risk complications. Cleaned open wound with alcohol and DSD applied.   Gardiner Barefoot DPM

## 2022-01-21 ENCOUNTER — Telehealth: Payer: Self-pay | Admitting: Internal Medicine

## 2022-01-21 ENCOUNTER — Ambulatory Visit: Payer: Medicare Other | Admitting: Physical Therapy

## 2022-01-21 NOTE — Telephone Encounter (Signed)
Not sure if the headache, body aches and fatigue are all related to the cat scratch.  How does the cat scratch area look at self-if that does not look infected I hate to give her more antibiotics and cause other issues.  We do not think the cat scratch would have caused a systemic infection.

## 2022-01-21 NOTE — Telephone Encounter (Signed)
Pt had appointment last Thursday for an infected cat scratch.   Pt called today and states that she took her last antibiotic, Amoxacillin, on Monday but her symptoms are returning, headache, body aches, and fatigue.  Pt is requesting new antibiotics, preferably something other than Amoxacillin because it gave her some diarrhea.  Please send new RX to: [facility], [place] AT [address] Fax: [phone]

## 2022-01-22 ENCOUNTER — Encounter: Payer: Self-pay | Admitting: Internal Medicine

## 2022-01-22 MED ORDER — DOXYCYCLINE HYCLATE 100 MG PO TABS: 100.0000 mg | ORAL_TABLET | Freq: Two times a day (BID) | ORAL | 0 refills | Status: AC

## 2022-01-22 NOTE — Telephone Encounter (Signed)
Abx sent in

## 2022-01-26 DIAGNOSIS — R2689 Other abnormalities of gait and mobility: Secondary | ICD-10-CM | POA: Diagnosis not present

## 2022-01-26 DIAGNOSIS — R2681 Unsteadiness on feet: Secondary | ICD-10-CM | POA: Diagnosis not present

## 2022-01-26 DIAGNOSIS — M5451 Vertebrogenic low back pain: Secondary | ICD-10-CM | POA: Diagnosis not present

## 2022-01-26 DIAGNOSIS — M6281 Muscle weakness (generalized): Secondary | ICD-10-CM | POA: Diagnosis not present

## 2022-01-29 DIAGNOSIS — M6281 Muscle weakness (generalized): Secondary | ICD-10-CM | POA: Diagnosis not present

## 2022-01-29 DIAGNOSIS — M5451 Vertebrogenic low back pain: Secondary | ICD-10-CM | POA: Diagnosis not present

## 2022-01-29 DIAGNOSIS — R2681 Unsteadiness on feet: Secondary | ICD-10-CM | POA: Diagnosis not present

## 2022-01-29 DIAGNOSIS — R2689 Other abnormalities of gait and mobility: Secondary | ICD-10-CM | POA: Diagnosis not present

## 2022-02-02 ENCOUNTER — Other Ambulatory Visit: Payer: Self-pay | Admitting: Internal Medicine

## 2022-02-02 DIAGNOSIS — E119 Type 2 diabetes mellitus without complications: Secondary | ICD-10-CM

## 2022-02-03 ENCOUNTER — Other Ambulatory Visit: Payer: Self-pay | Admitting: Internal Medicine

## 2022-02-03 DIAGNOSIS — M47816 Spondylosis without myelopathy or radiculopathy, lumbar region: Secondary | ICD-10-CM | POA: Diagnosis not present

## 2022-02-03 DIAGNOSIS — M47896 Other spondylosis, lumbar region: Secondary | ICD-10-CM | POA: Diagnosis not present

## 2022-02-04 ENCOUNTER — Ambulatory Visit: Payer: Medicare Other | Admitting: Cardiovascular Disease

## 2022-02-04 MED ORDER — BASAGLAR KWIKPEN 100 UNIT/ML ~~LOC~~ SOPN: 25.0000 [IU] | PEN_INJECTOR | Freq: Two times a day (BID) | SUBCUTANEOUS | 0 refills | Status: DC

## 2022-02-05 ENCOUNTER — Ambulatory Visit (INDEPENDENT_AMBULATORY_CARE_PROVIDER_SITE_OTHER): Payer: Medicare Other | Admitting: Family

## 2022-02-05 ENCOUNTER — Encounter (HOSPITAL_BASED_OUTPATIENT_CLINIC_OR_DEPARTMENT_OTHER): Payer: Self-pay | Admitting: Family

## 2022-02-05 VITALS — BP 130/68 | HR 66 | Ht 65.5 in | Wt 200.0 lb

## 2022-02-05 DIAGNOSIS — I10 Essential (primary) hypertension: Secondary | ICD-10-CM | POA: Diagnosis not present

## 2022-02-05 DIAGNOSIS — I4510 Unspecified right bundle-branch block: Secondary | ICD-10-CM

## 2022-02-05 DIAGNOSIS — Z79899 Other long term (current) drug therapy: Secondary | ICD-10-CM | POA: Diagnosis not present

## 2022-02-05 DIAGNOSIS — E785 Hyperlipidemia, unspecified: Secondary | ICD-10-CM | POA: Diagnosis not present

## 2022-02-05 DIAGNOSIS — L4059 Other psoriatic arthropathy: Secondary | ICD-10-CM | POA: Diagnosis not present

## 2022-02-05 DIAGNOSIS — G4733 Obstructive sleep apnea (adult) (pediatric): Secondary | ICD-10-CM

## 2022-02-05 NOTE — Patient Instructions (Signed)
Medication Instructions:  Continue your current medications.   *If you need a refill on your cardiac medications before your next appointment, please call your pharmacy*   Lab Work: None ordered today.   If you have labs (blood work) drawn today and your tests are completely normal, you will receive your results only by: [place] (if you have MyChart) OR A paper copy in the mail If you have any lab test that is abnormal or we need to change your treatment, we will call you to review the results.   Testing/Procedures: Your EKG today showed normal sinus rhythm with right bundle branch block. This is a stable finding and a good result!   Follow-Up: At [facility], you and your health needs are our priority.  As part of our continuing mission to provide you with exceptional heart care, we have created designated Provider Care Teams.  These Care Teams include your primary Cardiologist (physician) and Advanced Practice Providers (APPs -  Physician Assistants and Nurse Practitioners) who all work together to provide you with the care you need, when you need it.  We recommend signing up for the patient portal called "MyChart".  Sign up information is provided on this After Visit Summary.  MyChart is used to connect with patients for Virtual Visits (Telemedicine).  Patients are able to view lab/test results, encounter notes, upcoming appointments, etc.  Non-urgent messages can be sent to your provider as well.   To learn more about what you can do with MyChart, go to [email or web address].    Your next appointment:   3-4 month(s)  The format for your next appointment:   In Person  Provider:   Shelva Majestic, MD     Other Instructions Heart Healthy Diet Recommendations: A low-salt diet is recommended. Meats should be grilled, baked, or boiled. Avoid fried foods. Focus on lean protein sources like fish or chicken with vegetables and fruits. The American Heart Association is a  Microbiologist!  American Heart Association Diet and Lifeystyle Recommendations   Exercise recommendations: The American Heart Association recommends 150 minutes of moderate intensity exercise weekly. Try 30 minutes of moderate intensity exercise 4-5 times per week. This could include walking, jogging, or swimming.    Important Information About Sugar

## 2022-02-05 NOTE — Telephone Encounter (Signed)
PT calls today and states she is currently out of her insulin medication. PT was taking Levimir but was prescribed Basaglar. She has been informed by her pharmacy that her current insurance will not cover either insulin medications and she is unsure on how to proceed.  CB: [phone]

## 2022-02-05 NOTE — Progress Notes (Signed)
Office Visit    Patient Name: [name] [name] Date of Encounter: 02/05/2022  PCP:  Binnie Rail, [place]  Cardiologist:  Shelva Majestic, MD  Advanced Practice Provider:  No care team member to display Electrophysiologist:  None      Chief Complaint    [name] [name] is a 76 y.o. female presents today for follow up of hypertension.   Past Medical History    Past Medical History:  Diagnosis Date   Arthritis    Closed fracture of fifth metatarsal bone 05/13/2018   Deep vein thrombosis ([place])    right calf - 05/2012    Diabetes mellitus without complication (HCC)    diet controlled    GERD (gastroesophageal reflux disease)    Hyperlipidemia    Hypertension    Ejection fraction =>55% Left ventricular systolic function is normal. Left ventricular wall motion is normal     Lymphocytic colitis    Neuropathy    diabetic - in bilateral feet    Pityriasis lichenoides chronica    Sleep apnea    bipap   Past Surgical History:  Procedure Laterality Date   DILATION AND CURETTAGE OF UTERUS     EYE SURGERY  07/2020   HAMMER TOE SURGERY     right hand surgery      due to blood infection    torn meniscus repair      right knee    TOTAL HIP ARTHROPLASTY  09/13/2012   Procedure: TOTAL HIP ARTHROPLASTY ANTERIOR APPROACH;  Surgeon: Mauri Pole, MD;  Location: [facility];  Service: Orthopedics;  Laterality: Right;    Allergies  Allergies  Allergen Reactions   Other Shortness Of Breath    Blue fish: palms and feet turn red   Thorazine [Chlorpromazine] Other (See Comments)    Made her more more off balanced, vision   Bee Venom Swelling   Farxiga [Dapagliflozin]     Recurrent yeast infections   Semaglutide     Rybelsus - lip swelling, GI upset   Sulfa Antibiotics Hives    History of Present Illness    [name] [name] is a 76 y.o. female with a hx of OSA, HTN, obesity, psoriatic arthritis, DVT during travel last seen 10/20/21 by Dr. Claiborne Billings.  Follows  routinely with Dr. Claiborne Billings. Most recent echocardiogram 01/2022 normal LVEF 65-70%, gr1DD, mild MR. At last visit 10/20/21 due to hypotension her Lisinopril-HCTZ was transitioned to Lisinporil.   She presents today for follow up independently. She just moved with her husband into [employer] which has been very busy. New Bipap machine has been working well. Reports no shortness of breath nor dyspnea on exertion. Reports no chest pain, pressure, or tightness. No edema, orthopnea, PND. Reports no palpitations.  Has gained a couple pounds which she attributes to less exercise due to her move. She is starting water therapy next week.   EKGs/Labs/Other Studies Reviewed:   The following studies were reviewed today:   EKG:  No EKG today.   Recent Labs: 05/27/2021: Hemoglobin 11.8; Platelets 253 09/16/2021: ALT 31; BUN 18; Creatinine, Ser 0.88; Potassium 4.9; Sodium 134  Recent Lipid Panel    Component Value Date/Time   CHOL 125 09/16/2021 0951   TRIG 151.0 (H) 09/16/2021 0951   HDL 50.80 09/16/2021 0951   CHOLHDL 2 09/16/2021 0951   VLDL 30.2 09/16/2021 0951   LDLCALC 44 09/16/2021 0951   LDLDIRECT 139.0 08/15/2019 1114  Home Medications   Current Meds  Medication Sig   Accu-Chek Softclix Lancets lancets Use as instructed to check sugars 2-3 times daily.  E11.42   acetaminophen (TYLENOL) 325 MG tablet Take 2 tablets (650 mg total) by mouth every 6 (six) hours as needed for mild pain or headache.   Alcohol Swabs PADS UAD to check sugars.  E11.65   allopurinol (ZYLOPRIM) 100 MG tablet TAKE 1 TABLET(100 MG) BY MOUTH TWICE DAILY   ALPRAZolam (XANAX) 0.25 MG tablet TAKE 1 TABLET(0.25 MG) BY MOUTH TWICE DAILY AS NEEDED FOR ANXIETY   ascorbic acid (VITAMIN C) 500 MG tablet Take 500 mg by mouth at bedtime.   augmented betamethasone dipropionate (DIPROLENE-AF) 0.05 % cream    azelastine (ASTELIN) 0.1 % nasal spray INSTILL 2 SPRAYS IN EACH NOSTRIL TWICE DAILY   Blood Glucose  Monitoring Suppl (ACCU-CHEK GUIDE) w/Device KIT AS DIRECTED   Budesonide ER 9 MG CP24 Take 9 mg by mouth daily in the afternoon.   carvedilol (COREG) 6.25 MG tablet TAKE 1 TABLET(6.25 MG) BY MOUTH TWICE DAILY WITH A MEAL   Certolizumab Pegol (CIMZIA [place]) Inject 400 mg into the skin every 30 (thirty) days. 200 mg on each side of stomach   Cholecalciferol (VITAMIN D3) 1000 UNITS CAPS Take 1,000 Units by mouth 2 (two) times daily.    ciclopirox (LOPROX) 0.77 % cream Apply topically.   clobetasol cream (TEMOVATE) 9.14 % Apply 1 application. topically 2 (two) times daily.   Cyanocobalamin (VITAMIN B-12) 1000 MCG SUBL Place 1,000 mcg under the tongue daily.   cyclobenzaprine (FLEXERIL) 10 MG tablet TAKE 1 TABLET BY MOUTH AT BEDTIME   docusate sodium (COLACE) 100 MG capsule TAKE 1 CAPSULE(100 MG) BY MOUTH TWICE DAILY   folic acid (FOLVITE) 1 MG tablet TAKE 1 TABLET(1 MG) BY MOUTH DAILY   glucose blood (ACCU-CHEK GUIDE) test strip Use as instructed to check sugars 2-3 times daily.  E11.42   HYDROcodone-acetaminophen (NORCO) 10-325 MG tablet Take 1 tablet by mouth 3 (three) times daily as needed.   Insulin Glargine (BASAGLAR KWIKPEN) 100 UNIT/ML Inject 25 Units into the skin 2 (two) times daily.   Insulin Pen Needle (B-D UF III MINI PEN NEEDLES) 31G X 5 MM MISC Use daily for insulin pen   lisinopril (ZESTRIL) 20 MG tablet Take 1 tablet (20 mg total) by mouth daily.   loperamide (IMODIUM A-D) 2 MG tablet Take 1 tablet (2 mg total) by mouth 3 (three) times daily as needed for diarrhea or loose stools.   loperamide (IMODIUM) 2 MG capsule Take 6 mg by mouth 2 (two) times daily.   Melatonin 5 MG SUBL Place 5 mg under the tongue at bedtime.   metFORMIN (GLUCOPHAGE-XR) 500 MG 24 hr tablet TAKE 1 TABLET(500 MG) BY MOUTH TWICE DAILY   methotrexate (RHEUMATREX) 2.5 MG tablet Take 20 mg by mouth every Friday.    naftifine (NAFTIN) 1 % cream Apply 1 application. topically daily as needed (rash).   omeprazole  (PRILOSEC) 40 MG capsule TAKE 1 CAPSULE BY MOUTH EVERY DAY   potassium chloride (KLOR-CON) 10 MEQ tablet Take 3 tablets (30 mEq total) by mouth daily.   Probiotic Product (ALIGN PO) Take 1 capsule by mouth daily.   rosuvastatin (CRESTOR) 5 MG tablet TAKE 1 TABLET(5 MG) BY MOUTH DAILY   sertraline (ZOLOFT) 100 MG tablet TAKE 1 TABLET(100 MG) BY MOUTH DAILY     Review of Systems      All other systems reviewed and are otherwise negative except  as noted above.  Physical Exam    VS:  BP 130/68   Pulse 66   Ht 5' 5.5" (1.664 m)   Wt 200 lb (90.7 kg)   BMI 32.78 kg/m  , BMI Body mass index is 32.78 kg/m.  Wt Readings from Last 3 Encounters:  02/05/22 200 lb (90.7 kg)  10/20/21 203 lb (92.1 kg)  10/16/21 205 lb (93 kg)     GEN: Well nourished, well developed, in no acute distress. HEENT: normal. Neck: Supple, no JVD, carotid bruits, or masses. Cardiac: RRR, no murmurs, rubs, or gallops. No clubbing, cyanosis, edema.  Radials/PT 2+ and equal bilaterally.  Respiratory:  Respirations regular and unlabored, clear to auscultation bilaterally. GI: Soft, nontender, nondistended. MS: No deformity or atrophy. Skin: Warm and dry, no rash. Neuro:  Strength and sensation are intact. Psych: Normal affect.  Assessment & Plan    OSA - Endorses compliance with BIPAP. HTN - BP well controlled. Continue current antihypertensive regimen.   Obesity - Weight loss via diet and exercise encouraged. Discussed the impact being overweight would have on cardiovascular risk. Starting water therapy soon.  DM2 - Continue to follow with PCP.  HLD - Continue Rosuvastatin.      Disposition: Follow up in 3 month(s) with Shelva Majestic, MD or APP.  Signed, Loel Dubonnet, NP 02/05/2022, 10:53 AM [place]

## 2022-02-06 NOTE — Telephone Encounter (Signed)
Pt need PA for Basaglar Insulin. Submitted via cover-my-meds w/ [id number]. PA sent to plan for determination,,,/lmb

## 2022-02-07 ENCOUNTER — Encounter (HOSPITAL_BASED_OUTPATIENT_CLINIC_OR_DEPARTMENT_OTHER): Payer: Self-pay | Admitting: Family

## 2022-02-09 MED ORDER — LANTUS SOLOSTAR 100 UNIT/ML ~~LOC~~ SOPN: 25.0000 [IU] | PEN_INJECTOR | Freq: Two times a day (BID) | SUBCUTANEOUS | 11 refills | Status: DC

## 2022-02-09 NOTE — Telephone Encounter (Signed)
[facility] is asking if patient can try Lantus or Toujeo first? Patient has been out of Insulin for three days.

## 2022-02-09 NOTE — Telephone Encounter (Signed)
See below

## 2022-02-09 NOTE — Telephone Encounter (Signed)
Ok for lantus same dose  I sent to [facility]

## 2022-02-12 ENCOUNTER — Other Ambulatory Visit: Payer: Self-pay | Admitting: Internal Medicine

## 2022-02-13 DIAGNOSIS — M6281 Muscle weakness (generalized): Secondary | ICD-10-CM | POA: Diagnosis not present

## 2022-02-13 DIAGNOSIS — M5451 Vertebrogenic low back pain: Secondary | ICD-10-CM | POA: Diagnosis not present

## 2022-02-13 DIAGNOSIS — R2689 Other abnormalities of gait and mobility: Secondary | ICD-10-CM | POA: Diagnosis not present

## 2022-02-13 DIAGNOSIS — R2681 Unsteadiness on feet: Secondary | ICD-10-CM | POA: Diagnosis not present

## 2022-02-16 ENCOUNTER — Other Ambulatory Visit: Payer: Self-pay | Admitting: Internal Medicine

## 2022-02-16 DIAGNOSIS — R2681 Unsteadiness on feet: Secondary | ICD-10-CM | POA: Diagnosis not present

## 2022-02-16 DIAGNOSIS — M5451 Vertebrogenic low back pain: Secondary | ICD-10-CM | POA: Diagnosis not present

## 2022-02-16 DIAGNOSIS — R2689 Other abnormalities of gait and mobility: Secondary | ICD-10-CM | POA: Diagnosis not present

## 2022-02-16 DIAGNOSIS — M6281 Muscle weakness (generalized): Secondary | ICD-10-CM | POA: Diagnosis not present

## 2022-02-18 DIAGNOSIS — M5451 Vertebrogenic low back pain: Secondary | ICD-10-CM | POA: Diagnosis not present

## 2022-02-18 DIAGNOSIS — R2689 Other abnormalities of gait and mobility: Secondary | ICD-10-CM | POA: Diagnosis not present

## 2022-02-18 DIAGNOSIS — R2681 Unsteadiness on feet: Secondary | ICD-10-CM | POA: Diagnosis not present

## 2022-02-18 DIAGNOSIS — M6281 Muscle weakness (generalized): Secondary | ICD-10-CM | POA: Diagnosis not present

## 2022-02-19 ENCOUNTER — Other Ambulatory Visit: Payer: Self-pay | Admitting: Internal Medicine

## 2022-02-19 MED ORDER — ALPRAZOLAM 0.25 MG PO TABS
0.2500 mg | ORAL_TABLET | Freq: Three times a day (TID) | ORAL | 0 refills | Status: DC | PRN
Start: 2022-02-19 — End: 2022-08-13

## 2022-02-27 MED ORDER — BD PEN NEEDLE MINI U/F 31G X 5 MM MISC: 3 refills | Status: DC

## 2022-02-27 NOTE — Addendum Note (Signed)
Addended by: Earnstine Regal on: 02/27/2022 12:41 PM   Modules accepted: Orders

## 2022-03-05 DIAGNOSIS — L4059 Other psoriatic arthropathy: Secondary | ICD-10-CM | POA: Diagnosis not present

## 2022-03-09 DIAGNOSIS — M5451 Vertebrogenic low back pain: Secondary | ICD-10-CM | POA: Diagnosis not present

## 2022-03-09 DIAGNOSIS — R2689 Other abnormalities of gait and mobility: Secondary | ICD-10-CM | POA: Diagnosis not present

## 2022-03-09 DIAGNOSIS — R2681 Unsteadiness on feet: Secondary | ICD-10-CM | POA: Diagnosis not present

## 2022-03-09 DIAGNOSIS — M6281 Muscle weakness (generalized): Secondary | ICD-10-CM | POA: Diagnosis not present

## 2022-03-11 ENCOUNTER — Encounter: Payer: Self-pay | Admitting: Internal Medicine

## 2022-03-11 DIAGNOSIS — R2689 Other abnormalities of gait and mobility: Secondary | ICD-10-CM | POA: Diagnosis not present

## 2022-03-11 DIAGNOSIS — M5451 Vertebrogenic low back pain: Secondary | ICD-10-CM | POA: Diagnosis not present

## 2022-03-11 DIAGNOSIS — R2681 Unsteadiness on feet: Secondary | ICD-10-CM | POA: Diagnosis not present

## 2022-03-11 DIAGNOSIS — M6281 Muscle weakness (generalized): Secondary | ICD-10-CM | POA: Diagnosis not present

## 2022-03-11 NOTE — Progress Notes (Unsigned)
Subjective:    Patient ID: [name] [name], female    DOB: 1946/05/18, 76 y.o.   MRN: [id number]     HPI [name] [name] is here for follow up of her chronic medical problems, including diabetes with neuropathy, hypertension, hyperlipidemia, depression, anxiety and gout  Radiofreq ablation by dr Nelva Bush - has severe pain in lower back - standing is the worse - taking vicodin as needed  Long covid - can hardly eat meat - make her sick to think about it - can eat chopped chicken in something, but can not eat a piece of chicken or steak.  Still has hiccups from covid.    Hctz stopped by cardio - occasionally gets fluids in legs.  sugars well controlled at home.   Medications and allergies reviewed with patient and updated if appropriate.  Current Outpatient Medications on File Prior to Visit  Medication Sig Dispense Refill   Accu-Chek Softclix Lancets lancets Use as instructed to check sugars 2-3 times daily.  E11.42 100 each 12   acetaminophen (TYLENOL) 325 MG tablet Take 2 tablets (650 mg total) by mouth every 6 (six) hours as needed for mild pain or headache.     Alcohol Swabs PADS UAD to check sugars.  E11.65 100 each 12   allopurinol (ZYLOPRIM) 100 MG tablet TAKE 1 TABLET(100 MG) BY MOUTH TWICE DAILY 180 tablet 3   ALPRAZolam (XANAX) 0.25 MG tablet Take 1 tablet (0.25 mg total) by mouth 3 (three) times daily as needed for anxiety. 20 tablet 0   amoxicillin (AMOXIL) 500 MG capsule Take by mouth.     ascorbic acid (VITAMIN C) 500 MG tablet Take 500 mg by mouth at bedtime.     augmented betamethasone dipropionate (DIPROLENE-AF) 0.05 % cream      azelastine (ASTELIN) 0.1 % nasal spray INSTILL 2 SPRAYS IN EACH NOSTRIL TWICE DAILY 30 mL 12   Blood Glucose Monitoring Suppl (ACCU-CHEK GUIDE) w/Device KIT AS DIRECTED 1 kit 0   Budesonide ER 9 MG CP24 Take 9 mg by mouth daily in the afternoon. 90 capsule 1   carvedilol (COREG) 6.25 MG tablet TAKE 1 TABLET(6.25 MG) BY MOUTH TWICE DAILY WITH A  MEAL 60 tablet 3   Certolizumab Pegol (CIMZIA [place]) Inject 400 mg into the skin every 30 (thirty) days. 200 mg on each side of stomach     Cholecalciferol (VITAMIN D3) 1000 UNITS CAPS Take 1,000 Units by mouth 2 (two) times daily.      ciclopirox (LOPROX) 0.77 % cream Apply topically.     clobetasol cream (TEMOVATE) 9.56 % Apply 1 application. topically 2 (two) times daily.     Cyanocobalamin (VITAMIN B-12) 1000 MCG SUBL Place 1,000 mcg under the tongue daily.     cyclobenzaprine (FLEXERIL) 10 MG tablet TAKE 1 TABLET BY MOUTH AT BEDTIME 90 tablet 1   docusate sodium (COLACE) 100 MG capsule TAKE 1 CAPSULE(100 MG) BY MOUTH TWICE DAILY 60 capsule 2   folic acid (FOLVITE) 1 MG tablet TAKE 1 TABLET(1 MG) BY MOUTH DAILY 90 tablet 3   glucose blood (ACCU-CHEK GUIDE) test strip Use as instructed to check sugars 2-3 times daily.  E11.42 100 each 12   HYDROcodone-acetaminophen (NORCO) 10-325 MG tablet Take 1 tablet by mouth 3 (three) times daily as needed.     insulin glargine (LANTUS SOLOSTAR) 100 UNIT/ML Solostar Pen Inject 25 Units into the skin 2 (two) times daily. 15 mL 11   Insulin Pen Needle (B-D UF III MINI PEN  NEEDLES) 31G X 5 MM MISC Use daily for insulin pen 90 each 3   lisinopril (ZESTRIL) 20 MG tablet Take 1 tablet (20 mg total) by mouth daily. 90 tablet 3   loperamide (IMODIUM A-D) 2 MG tablet Take 1 tablet (2 mg total) by mouth 3 (three) times daily as needed for diarrhea or loose stools. 90 tablet 3   loperamide (IMODIUM) 2 MG capsule Take 6 mg by mouth 2 (two) times daily.     Melatonin 5 MG SUBL Place 5 mg under the tongue at bedtime.     metFORMIN (GLUCOPHAGE-XR) 500 MG 24 hr tablet TAKE 1 TABLET(500 MG) BY MOUTH TWICE DAILY 180 tablet 2   methotrexate (RHEUMATREX) 2.5 MG tablet Take 20 mg by mouth every Friday.      naftifine (NAFTIN) 1 % cream Apply 1 application. topically daily as needed (rash).     omeprazole (PRILOSEC) 40 MG capsule TAKE 1 CAPSULE BY MOUTH EVERY DAY 90 capsule 1    potassium chloride (KLOR-CON M) 10 MEQ tablet TAKE 3 TABLETS(30 MEQ) BY MOUTH DAILY 270 tablet 3   Probiotic Product (ALIGN PO) Take 1 capsule by mouth daily.     rosuvastatin (CRESTOR) 5 MG tablet TAKE 1 TABLET(5 MG) BY MOUTH DAILY 90 tablet 3   sertraline (ZOLOFT) 100 MG tablet TAKE 1 TABLET(100 MG) BY MOUTH DAILY 90 tablet 1   No current facility-administered medications on file prior to visit.     Review of Systems  Constitutional:  Negative for fever.  HENT:  Positive for postnasal drip (sometimes).        Mild phlegm in throat - causes her to clear throat  Respiratory:  Negative for cough, shortness of breath and wheezing.   Cardiovascular:  Negative for chest pain, palpitations and leg swelling.  Gastrointestinal:  Positive for constipation.       Gerd controlled  Neurological:  Negative for light-headedness and headaches.       Objective:   Vitals:   03/12/22 0926  BP: 122/78  Pulse: 72  Temp: 98.2 F (36.8 C)  SpO2: 93%   BP Readings from Last 3 Encounters:  03/12/22 122/78  02/05/22 130/68  01/13/22 128/62   Wt Readings from Last 3 Encounters:  03/12/22 200 lb 4 oz (90.8 kg)  02/05/22 200 lb (90.7 kg)  10/20/21 203 lb (92.1 kg)   Body mass index is 33.32 kg/m.    Physical Exam Constitutional:      General: She is not in acute distress.    Appearance: Normal appearance.  HENT:     Head: Normocephalic and atraumatic.  Eyes:     Conjunctiva/sclera: Conjunctivae normal.  Cardiovascular:     Rate and Rhythm: Normal rate and regular rhythm.     Heart sounds: Normal heart sounds. No murmur heard. Pulmonary:     Effort: Pulmonary effort is normal. No respiratory distress.     Breath sounds: Normal breath sounds. No wheezing.  Musculoskeletal:     Cervical back: Neck supple.     Right lower leg: No edema.     Left lower leg: No edema.  Lymphadenopathy:     Cervical: No cervical adenopathy.  Skin:    General: Skin is warm and dry.     Findings: No  rash.  Neurological:     Mental Status: She is alert. Mental status is at baseline.  Psychiatric:        Mood and Affect: Mood normal.        Behavior:  Behavior normal.        Lab Results  Component Value Date   WBC 11.9 (H) 05/27/2021   HGB 11.8 (L) 05/27/2021   HCT 37.7 05/27/2021   PLT 253 05/27/2021   GLUCOSE 121 (H) 09/16/2021   CHOL 125 09/16/2021   TRIG 151.0 (H) 09/16/2021   HDL 50.80 09/16/2021   LDLDIRECT 139.0 08/15/2019   LDLCALC 44 09/16/2021   ALT 31 09/16/2021   AST 35 09/16/2021   NA 134 (L) 09/16/2021   K 4.9 09/16/2021   CL 96 09/16/2021   CREATININE 0.88 09/16/2021   BUN 18 09/16/2021   CO2 32 09/16/2021   TSH 3.249 07/16/2020   INR 1.14 09/13/2012   HGBA1C 6.6 (H) 09/16/2021     Assessment & Plan:    See Problem List for Assessment and Plan of chronic medical problems.

## 2022-03-11 NOTE — Telephone Encounter (Signed)
error 

## 2022-03-11 NOTE — Patient Instructions (Addendum)
     Blood work was ordered.     Medications changes include :      Your prescription(s) have been sent to your pharmacy.    A referral was ordered for XX.     Someone from that office will call you to schedule an appointment.    Return in about 6 months (around 09/12/2022) for follow up.

## 2022-03-12 ENCOUNTER — Ambulatory Visit (INDEPENDENT_AMBULATORY_CARE_PROVIDER_SITE_OTHER): Payer: Medicare Other | Admitting: Internal Medicine

## 2022-03-12 VITALS — BP 122/78 | HR 72 | Temp 98.2°F | Ht 65.0 in | Wt 200.2 lb

## 2022-03-12 DIAGNOSIS — E785 Hyperlipidemia, unspecified: Secondary | ICD-10-CM

## 2022-03-12 DIAGNOSIS — M109 Gout, unspecified: Secondary | ICD-10-CM | POA: Diagnosis not present

## 2022-03-12 DIAGNOSIS — E1142 Type 2 diabetes mellitus with diabetic polyneuropathy: Secondary | ICD-10-CM | POA: Diagnosis not present

## 2022-03-12 DIAGNOSIS — I10 Essential (primary) hypertension: Secondary | ICD-10-CM

## 2022-03-12 DIAGNOSIS — Z13820 Encounter for screening for osteoporosis: Secondary | ICD-10-CM

## 2022-03-12 DIAGNOSIS — F3289 Other specified depressive episodes: Secondary | ICD-10-CM | POA: Diagnosis not present

## 2022-03-12 DIAGNOSIS — F419 Anxiety disorder, unspecified: Secondary | ICD-10-CM | POA: Diagnosis not present

## 2022-03-12 DIAGNOSIS — E2839 Other primary ovarian failure: Secondary | ICD-10-CM

## 2022-03-12 LAB — CBC WITH DIFFERENTIAL/PLATELET
Basophils Absolute: 0.1 10*3/uL (ref 0.0–0.1)
Basophils Relative: 0.4 % (ref 0.0–3.0)
Eosinophils Absolute: 0.1 10*3/uL (ref 0.0–0.7)
Eosinophils Relative: 1 % (ref 0.0–5.0)
HCT: 35.1 % — ABNORMAL LOW (ref 36.0–46.0)
Hemoglobin: 11.1 g/dL — ABNORMAL LOW (ref 12.0–15.0)
Lymphocytes Relative: 24 % (ref 12.0–46.0)
Lymphs Abs: 3.1 10*3/uL (ref 0.7–4.0)
MCHC: 31.7 g/dL (ref 30.0–36.0)
MCV: 91.6 fl (ref 78.0–100.0)
Monocytes Absolute: 0.6 10*3/uL (ref 0.1–1.0)
Monocytes Relative: 5 % (ref 3.0–12.0)
Neutro Abs: 9 10*3/uL — ABNORMAL HIGH (ref 1.4–7.7)
Neutrophils Relative %: 69.6 % (ref 43.0–77.0)
Platelets: 285 10*3/uL (ref 150.0–400.0)
RBC: 3.83 Mil/uL — ABNORMAL LOW (ref 3.87–5.11)
RDW: 19.8 % — ABNORMAL HIGH (ref 11.5–15.5)
WBC: 12.9 10*3/uL — ABNORMAL HIGH (ref 4.0–10.5)

## 2022-03-12 LAB — COMPREHENSIVE METABOLIC PANEL
ALT: 16 U/L (ref 0–35)
AST: 20 U/L (ref 0–37)
Albumin: 3.9 g/dL (ref 3.5–5.2)
Alkaline Phosphatase: 65 U/L (ref 39–117)
BUN: 20 mg/dL (ref 6–23)
CO2: 29 mEq/L (ref 19–32)
Calcium: 10.1 mg/dL (ref 8.4–10.5)
Chloride: 100 mEq/L (ref 96–112)
Creatinine, Ser: 0.85 mg/dL (ref 0.40–1.20)
GFR: 66.45 mL/min (ref >60.00)
Glucose, Bld: 87 mg/dL (ref 70–99)
Potassium: 4.5 mEq/L (ref 3.5–5.1)
Sodium: 135 mEq/L (ref 135–145)
Total Bilirubin: 0.4 mg/dL (ref 0.2–1.2)
Total Protein: 6.7 g/dL (ref 6.0–8.3)

## 2022-03-12 LAB — LIPID PANEL
Cholesterol: 134 mg/dL (ref 0–200)
HDL: 49.6 mg/dL (ref >39.00)
LDL Cholesterol: 61 mg/dL (ref 0–99)
NonHDL: 84.84
Total CHOL/HDL Ratio: 3
Triglycerides: 121 mg/dL (ref 0.0–149.0)
VLDL: 24.2 mg/dL (ref 0.0–40.0)

## 2022-03-12 LAB — HEMOGLOBIN A1C: Hgb A1c MFr Bld: 6.6 % — ABNORMAL HIGH (ref 4.6–6.5)

## 2022-03-12 LAB — URIC ACID: Uric Acid, Serum: 4.4 mg/dL (ref 2.4–7.0)

## 2022-03-12 NOTE — Assessment & Plan Note (Signed)
Chronic Controlled, Stable Continue sertraline 100 mg daily, Xanax 0.25 mg 3 times daily as needed

## 2022-03-12 NOTE — Assessment & Plan Note (Addendum)
Chronic  Lab Results  Component Value Date   HGBA1C 6.6 (H) 09/16/2021   Sugars well controlled Testing sugars 1 times a day Check A1c, urine microalbumin today Continue Lantus 25 units twice daily metformin XR 500 mg twice daily Stressed regular exercise, diabetic diet  Monitor sugars closely-May need to decrease insulin

## 2022-03-12 NOTE — Assessment & Plan Note (Signed)
Chronic Sees podiatry regularly

## 2022-03-12 NOTE — Assessment & Plan Note (Signed)
Chronic Controlled, Stable Continue sertraline 100 mg daily 

## 2022-03-12 NOTE — Assessment & Plan Note (Signed)
Chronic Regular exercise and healthy diet encouraged Check lipid panel  Continue Crestor 5 mg daily 

## 2022-03-12 NOTE — Assessment & Plan Note (Signed)
Chronic Blood pressure well controlled CMP Continue Coreg 6.25 mg twice daily, lisinopril 20 mg daily

## 2022-03-12 NOTE — Assessment & Plan Note (Signed)
Chronic Controlled, Stable Continue allopurinol 100 mg twice daily Check uric acid level If well controlled like it was last year can decrease allopurinol to once daily

## 2022-03-16 DIAGNOSIS — M5451 Vertebrogenic low back pain: Secondary | ICD-10-CM | POA: Diagnosis not present

## 2022-03-16 DIAGNOSIS — M6281 Muscle weakness (generalized): Secondary | ICD-10-CM | POA: Diagnosis not present

## 2022-03-16 DIAGNOSIS — R2681 Unsteadiness on feet: Secondary | ICD-10-CM | POA: Diagnosis not present

## 2022-03-16 DIAGNOSIS — R2689 Other abnormalities of gait and mobility: Secondary | ICD-10-CM | POA: Diagnosis not present

## 2022-03-18 DIAGNOSIS — M6281 Muscle weakness (generalized): Secondary | ICD-10-CM | POA: Diagnosis not present

## 2022-03-18 DIAGNOSIS — M5451 Vertebrogenic low back pain: Secondary | ICD-10-CM | POA: Diagnosis not present

## 2022-03-18 DIAGNOSIS — R2681 Unsteadiness on feet: Secondary | ICD-10-CM | POA: Diagnosis not present

## 2022-03-18 DIAGNOSIS — R2689 Other abnormalities of gait and mobility: Secondary | ICD-10-CM | POA: Diagnosis not present

## 2022-03-19 ENCOUNTER — Other Ambulatory Visit: Payer: Self-pay | Admitting: Internal Medicine

## 2022-03-20 DIAGNOSIS — M47816 Spondylosis without myelopathy or radiculopathy, lumbar region: Secondary | ICD-10-CM | POA: Diagnosis not present

## 2022-03-25 DIAGNOSIS — M5451 Vertebrogenic low back pain: Secondary | ICD-10-CM | POA: Diagnosis not present

## 2022-03-25 DIAGNOSIS — M6281 Muscle weakness (generalized): Secondary | ICD-10-CM | POA: Diagnosis not present

## 2022-03-25 DIAGNOSIS — R2681 Unsteadiness on feet: Secondary | ICD-10-CM | POA: Diagnosis not present

## 2022-03-25 DIAGNOSIS — R2689 Other abnormalities of gait and mobility: Secondary | ICD-10-CM | POA: Diagnosis not present

## 2022-03-30 DIAGNOSIS — M5451 Vertebrogenic low back pain: Secondary | ICD-10-CM | POA: Diagnosis not present

## 2022-03-30 DIAGNOSIS — R2689 Other abnormalities of gait and mobility: Secondary | ICD-10-CM | POA: Diagnosis not present

## 2022-03-30 DIAGNOSIS — R2681 Unsteadiness on feet: Secondary | ICD-10-CM | POA: Diagnosis not present

## 2022-03-30 DIAGNOSIS — M6281 Muscle weakness (generalized): Secondary | ICD-10-CM | POA: Diagnosis not present

## 2022-04-01 DIAGNOSIS — M6281 Muscle weakness (generalized): Secondary | ICD-10-CM | POA: Diagnosis not present

## 2022-04-01 DIAGNOSIS — M5451 Vertebrogenic low back pain: Secondary | ICD-10-CM | POA: Diagnosis not present

## 2022-04-01 DIAGNOSIS — R2681 Unsteadiness on feet: Secondary | ICD-10-CM | POA: Diagnosis not present

## 2022-04-01 DIAGNOSIS — R2689 Other abnormalities of gait and mobility: Secondary | ICD-10-CM | POA: Diagnosis not present

## 2022-04-06 DIAGNOSIS — M6281 Muscle weakness (generalized): Secondary | ICD-10-CM | POA: Diagnosis not present

## 2022-04-06 DIAGNOSIS — R2681 Unsteadiness on feet: Secondary | ICD-10-CM | POA: Diagnosis not present

## 2022-04-06 DIAGNOSIS — R2689 Other abnormalities of gait and mobility: Secondary | ICD-10-CM | POA: Diagnosis not present

## 2022-04-06 DIAGNOSIS — M5451 Vertebrogenic low back pain: Secondary | ICD-10-CM | POA: Diagnosis not present

## 2022-04-07 DIAGNOSIS — M170 Bilateral primary osteoarthritis of knee: Secondary | ICD-10-CM | POA: Insufficient documentation

## 2022-04-08 DIAGNOSIS — R2681 Unsteadiness on feet: Secondary | ICD-10-CM | POA: Diagnosis not present

## 2022-04-08 DIAGNOSIS — R2689 Other abnormalities of gait and mobility: Secondary | ICD-10-CM | POA: Diagnosis not present

## 2022-04-08 DIAGNOSIS — M170 Bilateral primary osteoarthritis of knee: Secondary | ICD-10-CM | POA: Diagnosis not present

## 2022-04-08 DIAGNOSIS — M5451 Vertebrogenic low back pain: Secondary | ICD-10-CM | POA: Diagnosis not present

## 2022-04-08 DIAGNOSIS — M6281 Muscle weakness (generalized): Secondary | ICD-10-CM | POA: Diagnosis not present

## 2022-04-09 DIAGNOSIS — Z79899 Other long term (current) drug therapy: Secondary | ICD-10-CM | POA: Diagnosis not present

## 2022-04-09 DIAGNOSIS — L4059 Other psoriatic arthropathy: Secondary | ICD-10-CM | POA: Diagnosis not present

## 2022-04-10 ENCOUNTER — Telehealth: Payer: Self-pay | Admitting: Internal Medicine

## 2022-04-10 MED ORDER — LOPERAMIDE HCL 2 MG PO TABS: 2.0000 mg | ORAL_TABLET | Freq: Three times a day (TID) | ORAL | 0 refills | Status: DC | PRN

## 2022-04-10 NOTE — Telephone Encounter (Signed)
Patient called requesting a refill for Imodium

## 2022-04-10 NOTE — Telephone Encounter (Signed)
Rx for Imodium sent to pharmacy as requested.

## 2022-04-11 DIAGNOSIS — M47816 Spondylosis without myelopathy or radiculopathy, lumbar region: Secondary | ICD-10-CM | POA: Diagnosis not present

## 2022-04-13 ENCOUNTER — Other Ambulatory Visit: Payer: Self-pay | Admitting: Internal Medicine

## 2022-04-14 ENCOUNTER — Other Ambulatory Visit: Payer: Self-pay

## 2022-04-14 ENCOUNTER — Inpatient Hospital Stay: Admission: RE | Admit: 2022-04-14 | Payer: Medicare Other | Source: Ambulatory Visit

## 2022-04-14 ENCOUNTER — Other Ambulatory Visit: Payer: Self-pay | Admitting: Internal Medicine

## 2022-04-14 MED ORDER — LOPERAMIDE HCL 2 MG PO TABS: 2.0000 mg | ORAL_TABLET | Freq: Three times a day (TID) | ORAL | 0 refills | Status: DC | PRN

## 2022-04-15 DIAGNOSIS — M6281 Muscle weakness (generalized): Secondary | ICD-10-CM | POA: Diagnosis not present

## 2022-04-15 DIAGNOSIS — M1712 Unilateral primary osteoarthritis, left knee: Secondary | ICD-10-CM | POA: Diagnosis not present

## 2022-04-15 DIAGNOSIS — R2689 Other abnormalities of gait and mobility: Secondary | ICD-10-CM | POA: Diagnosis not present

## 2022-04-15 DIAGNOSIS — R2681 Unsteadiness on feet: Secondary | ICD-10-CM | POA: Diagnosis not present

## 2022-04-15 DIAGNOSIS — M5451 Vertebrogenic low back pain: Secondary | ICD-10-CM | POA: Diagnosis not present

## 2022-04-15 DIAGNOSIS — M1711 Unilateral primary osteoarthritis, right knee: Secondary | ICD-10-CM | POA: Diagnosis not present

## 2022-04-17 DIAGNOSIS — M6281 Muscle weakness (generalized): Secondary | ICD-10-CM | POA: Diagnosis not present

## 2022-04-17 DIAGNOSIS — M5451 Vertebrogenic low back pain: Secondary | ICD-10-CM | POA: Diagnosis not present

## 2022-04-17 DIAGNOSIS — R2681 Unsteadiness on feet: Secondary | ICD-10-CM | POA: Diagnosis not present

## 2022-04-17 DIAGNOSIS — R2689 Other abnormalities of gait and mobility: Secondary | ICD-10-CM | POA: Diagnosis not present

## 2022-04-20 DIAGNOSIS — R2681 Unsteadiness on feet: Secondary | ICD-10-CM | POA: Diagnosis not present

## 2022-04-20 DIAGNOSIS — M6281 Muscle weakness (generalized): Secondary | ICD-10-CM | POA: Diagnosis not present

## 2022-04-20 DIAGNOSIS — M5451 Vertebrogenic low back pain: Secondary | ICD-10-CM | POA: Diagnosis not present

## 2022-04-20 DIAGNOSIS — R2689 Other abnormalities of gait and mobility: Secondary | ICD-10-CM | POA: Diagnosis not present

## 2022-04-21 ENCOUNTER — Ambulatory Visit: Payer: TRICARE For Life (TFL) | Admitting: Podiatry

## 2022-04-21 ENCOUNTER — Encounter: Payer: Self-pay | Admitting: Internal Medicine

## 2022-04-22 DIAGNOSIS — R2681 Unsteadiness on feet: Secondary | ICD-10-CM | POA: Diagnosis not present

## 2022-04-22 DIAGNOSIS — M25551 Pain in right hip: Secondary | ICD-10-CM | POA: Diagnosis not present

## 2022-04-22 DIAGNOSIS — Z96641 Presence of right artificial hip joint: Secondary | ICD-10-CM | POA: Diagnosis not present

## 2022-04-22 DIAGNOSIS — M5451 Vertebrogenic low back pain: Secondary | ICD-10-CM | POA: Diagnosis not present

## 2022-04-22 DIAGNOSIS — M7061 Trochanteric bursitis, right hip: Secondary | ICD-10-CM | POA: Diagnosis not present

## 2022-04-22 DIAGNOSIS — R2689 Other abnormalities of gait and mobility: Secondary | ICD-10-CM | POA: Diagnosis not present

## 2022-04-22 DIAGNOSIS — M6281 Muscle weakness (generalized): Secondary | ICD-10-CM | POA: Diagnosis not present

## 2022-04-27 ENCOUNTER — Encounter: Payer: Self-pay | Admitting: Internal Medicine

## 2022-04-27 DIAGNOSIS — M5451 Vertebrogenic low back pain: Secondary | ICD-10-CM | POA: Diagnosis not present

## 2022-04-27 DIAGNOSIS — R2689 Other abnormalities of gait and mobility: Secondary | ICD-10-CM | POA: Diagnosis not present

## 2022-04-27 DIAGNOSIS — R2681 Unsteadiness on feet: Secondary | ICD-10-CM | POA: Diagnosis not present

## 2022-04-27 DIAGNOSIS — M6281 Muscle weakness (generalized): Secondary | ICD-10-CM | POA: Diagnosis not present

## 2022-04-28 ENCOUNTER — Inpatient Hospital Stay: Admission: RE | Admit: 2022-04-28 | Payer: Medicare Other | Source: Ambulatory Visit

## 2022-04-28 DIAGNOSIS — M19079 Primary osteoarthritis, unspecified ankle and foot: Secondary | ICD-10-CM | POA: Diagnosis not present

## 2022-04-28 DIAGNOSIS — M21079 Valgus deformity, not elsewhere classified, unspecified ankle: Secondary | ICD-10-CM | POA: Diagnosis not present

## 2022-04-28 DIAGNOSIS — M79671 Pain in right foot: Secondary | ICD-10-CM | POA: Diagnosis not present

## 2022-04-29 DIAGNOSIS — M6281 Muscle weakness (generalized): Secondary | ICD-10-CM | POA: Diagnosis not present

## 2022-04-29 DIAGNOSIS — R2689 Other abnormalities of gait and mobility: Secondary | ICD-10-CM | POA: Diagnosis not present

## 2022-04-29 DIAGNOSIS — R2681 Unsteadiness on feet: Secondary | ICD-10-CM | POA: Diagnosis not present

## 2022-04-29 DIAGNOSIS — M5451 Vertebrogenic low back pain: Secondary | ICD-10-CM | POA: Diagnosis not present

## 2022-05-01 DIAGNOSIS — R2681 Unsteadiness on feet: Secondary | ICD-10-CM | POA: Diagnosis not present

## 2022-05-01 DIAGNOSIS — M6281 Muscle weakness (generalized): Secondary | ICD-10-CM | POA: Diagnosis not present

## 2022-05-01 DIAGNOSIS — M5451 Vertebrogenic low back pain: Secondary | ICD-10-CM | POA: Diagnosis not present

## 2022-05-01 DIAGNOSIS — R2689 Other abnormalities of gait and mobility: Secondary | ICD-10-CM | POA: Diagnosis not present

## 2022-05-03 DIAGNOSIS — M7061 Trochanteric bursitis, right hip: Secondary | ICD-10-CM | POA: Insufficient documentation

## 2022-05-04 DIAGNOSIS — R2689 Other abnormalities of gait and mobility: Secondary | ICD-10-CM | POA: Diagnosis not present

## 2022-05-04 DIAGNOSIS — R2681 Unsteadiness on feet: Secondary | ICD-10-CM | POA: Diagnosis not present

## 2022-05-04 DIAGNOSIS — M5451 Vertebrogenic low back pain: Secondary | ICD-10-CM | POA: Diagnosis not present

## 2022-05-04 DIAGNOSIS — M6281 Muscle weakness (generalized): Secondary | ICD-10-CM | POA: Diagnosis not present

## 2022-05-05 ENCOUNTER — Ambulatory Visit (INDEPENDENT_AMBULATORY_CARE_PROVIDER_SITE_OTHER)
Admission: RE | Admit: 2022-05-05 | Discharge: 2022-05-05 | Disposition: A | Discharge status: Home / Self Care | Payer: Medicare Other | Source: Ambulatory Visit | Attending: Internal Medicine | Admitting: Internal Medicine

## 2022-05-05 ENCOUNTER — Other Ambulatory Visit: Payer: Self-pay | Admitting: Internal Medicine

## 2022-05-05 ENCOUNTER — Encounter: Payer: Self-pay | Admitting: Internal Medicine

## 2022-05-05 DIAGNOSIS — E2839 Other primary ovarian failure: Secondary | ICD-10-CM | POA: Diagnosis not present

## 2022-05-05 DIAGNOSIS — Z13820 Encounter for screening for osteoporosis: Secondary | ICD-10-CM

## 2022-05-06 ENCOUNTER — Other Ambulatory Visit: Payer: Self-pay

## 2022-05-06 DIAGNOSIS — M6281 Muscle weakness (generalized): Secondary | ICD-10-CM | POA: Diagnosis not present

## 2022-05-06 DIAGNOSIS — R2689 Other abnormalities of gait and mobility: Secondary | ICD-10-CM | POA: Diagnosis not present

## 2022-05-06 DIAGNOSIS — R2681 Unsteadiness on feet: Secondary | ICD-10-CM | POA: Diagnosis not present

## 2022-05-06 DIAGNOSIS — M5451 Vertebrogenic low back pain: Secondary | ICD-10-CM | POA: Diagnosis not present

## 2022-05-06 MED ORDER — LOPERAMIDE HCL 2 MG PO TABS: 2.0000 mg | ORAL_TABLET | Freq: Four times a day (QID) | ORAL | 1 refills | Status: DC | PRN

## 2022-05-06 NOTE — Telephone Encounter (Signed)
Prescription sent in as requested 

## 2022-05-07 DIAGNOSIS — L4059 Other psoriatic arthropathy: Secondary | ICD-10-CM | POA: Diagnosis not present

## 2022-05-11 DIAGNOSIS — R2681 Unsteadiness on feet: Secondary | ICD-10-CM | POA: Diagnosis not present

## 2022-05-11 DIAGNOSIS — M5451 Vertebrogenic low back pain: Secondary | ICD-10-CM | POA: Diagnosis not present

## 2022-05-11 DIAGNOSIS — R2689 Other abnormalities of gait and mobility: Secondary | ICD-10-CM | POA: Diagnosis not present

## 2022-05-11 DIAGNOSIS — M6281 Muscle weakness (generalized): Secondary | ICD-10-CM | POA: Diagnosis not present

## 2022-05-13 ENCOUNTER — Ambulatory Visit (INDEPENDENT_AMBULATORY_CARE_PROVIDER_SITE_OTHER): Payer: Medicare Other | Admitting: Podiatry

## 2022-05-13 ENCOUNTER — Encounter: Payer: Self-pay | Admitting: Podiatry

## 2022-05-13 ENCOUNTER — Other Ambulatory Visit: Payer: Self-pay | Admitting: Internal Medicine

## 2022-05-13 DIAGNOSIS — B351 Tinea unguium: Secondary | ICD-10-CM

## 2022-05-13 DIAGNOSIS — M79674 Pain in right toe(s): Secondary | ICD-10-CM

## 2022-05-13 DIAGNOSIS — R2689 Other abnormalities of gait and mobility: Secondary | ICD-10-CM | POA: Diagnosis not present

## 2022-05-13 DIAGNOSIS — M6281 Muscle weakness (generalized): Secondary | ICD-10-CM | POA: Diagnosis not present

## 2022-05-13 DIAGNOSIS — M79675 Pain in left toe(s): Secondary | ICD-10-CM

## 2022-05-13 DIAGNOSIS — M14672 Charcot's joint, left ankle and foot: Secondary | ICD-10-CM

## 2022-05-13 DIAGNOSIS — R2681 Unsteadiness on feet: Secondary | ICD-10-CM | POA: Diagnosis not present

## 2022-05-13 DIAGNOSIS — I739 Peripheral vascular disease, unspecified: Secondary | ICD-10-CM

## 2022-05-13 DIAGNOSIS — M5451 Vertebrogenic low back pain: Secondary | ICD-10-CM | POA: Diagnosis not present

## 2022-05-13 DIAGNOSIS — E1142 Type 2 diabetes mellitus with diabetic polyneuropathy: Secondary | ICD-10-CM

## 2022-05-13 NOTE — Progress Notes (Signed)

## 2022-05-14 ENCOUNTER — Encounter: Payer: Self-pay | Admitting: Internal Medicine

## 2022-05-14 DIAGNOSIS — E1142 Type 2 diabetes mellitus with diabetic polyneuropathy: Secondary | ICD-10-CM

## 2022-05-15 ENCOUNTER — Other Ambulatory Visit: Payer: Self-pay | Admitting: Internal Medicine

## 2022-05-15 ENCOUNTER — Other Ambulatory Visit: Payer: Self-pay

## 2022-05-15 ENCOUNTER — Telehealth: Payer: Self-pay | Admitting: Internal Medicine

## 2022-05-15 DIAGNOSIS — U071 COVID-19: Secondary | ICD-10-CM | POA: Insufficient documentation

## 2022-05-15 MED ORDER — MOLNUPIRAVIR EUA 200MG CAPSULE: 4.0000 | ORAL_CAPSULE | Freq: Two times a day (BID) | ORAL | 0 refills | Status: AC

## 2022-05-15 NOTE — Telephone Encounter (Signed)
Pt called and stated that she thinks she has been exposed to [place] along with her partner and that she is having the symptoms and wanted to see if she can be prescribed the antiviral medication.[place] [phone]. Best call back # is [phone]

## 2022-05-18 ENCOUNTER — Telehealth: Payer: Self-pay | Admitting: *Deleted

## 2022-05-18 ENCOUNTER — Ambulatory Visit: Payer: Medicare Other | Attending: Cardiovascular Disease | Admitting: Cardiovascular Disease

## 2022-05-18 VITALS — BP 134/68 | HR 65 | Wt 207.0 lb

## 2022-05-18 DIAGNOSIS — M25473 Effusion, unspecified ankle: Secondary | ICD-10-CM | POA: Diagnosis not present

## 2022-05-18 DIAGNOSIS — L4050 Arthropathic psoriasis, unspecified: Secondary | ICD-10-CM | POA: Insufficient documentation

## 2022-05-18 DIAGNOSIS — G4733 Obstructive sleep apnea (adult) (pediatric): Secondary | ICD-10-CM | POA: Insufficient documentation

## 2022-05-18 DIAGNOSIS — I4510 Unspecified right bundle-branch block: Secondary | ICD-10-CM | POA: Insufficient documentation

## 2022-05-18 DIAGNOSIS — E118 Type 2 diabetes mellitus with unspecified complications: Secondary | ICD-10-CM | POA: Insufficient documentation

## 2022-05-18 DIAGNOSIS — I10 Essential (primary) hypertension: Secondary | ICD-10-CM | POA: Diagnosis not present

## 2022-05-18 DIAGNOSIS — E785 Hyperlipidemia, unspecified: Secondary | ICD-10-CM | POA: Insufficient documentation

## 2022-05-18 MED ORDER — HYDROCHLOROTHIAZIDE 12.5 MG PO CAPS: 12.5000 mg | ORAL_CAPSULE | ORAL | 3 refills | Status: DC | PRN

## 2022-05-18 NOTE — Telephone Encounter (Signed)
BIPAP pressure change submitted to [facility] via [place] portal per Dr Claiborne Billings as follows. Min PS 5 Max PS 8.  EPAP min 10 IPAP max 25.

## 2022-05-18 NOTE — Patient Instructions (Signed)
Medication Instructions:  Take HCTZ 12.5 mg AS NEEDED for swelling  *If you need a refill on your cardiac medications before your next appointment, please call your pharmacy*  Follow-Up: At [facility], you and your health needs are our priority.  As part of our continuing mission to provide you with exceptional heart care, we have created designated Provider Care Teams.  These Care Teams include your primary Cardiologist (physician) and Advanced Practice Providers (APPs -  Physician Assistants and Nurse Practitioners) who all work together to provide you with the care you need, when you need it.  We recommend signing up for the patient portal called "MyChart".  Sign up information is provided on this After Visit Summary.  MyChart is used to connect with patients for Virtual Visits (Telemedicine).  Patients are able to view lab/test results, encounter notes, upcoming appointments, etc.  Non-urgent messages can be sent to your provider as well.   To learn more about what you can do with MyChart, go to [email or web address].    Your next appointment:   3-4 month(s)  The format for your next appointment:   In Person  Provider:   Shelva Majestic, MD

## 2022-05-19 ENCOUNTER — Other Ambulatory Visit: Payer: Self-pay

## 2022-05-19 MED ORDER — AZELASTINE HCL 0.1 % NA SOLN: NASAL | 12 refills | Status: DC

## 2022-05-20 ENCOUNTER — Other Ambulatory Visit: Payer: Self-pay

## 2022-05-20 ENCOUNTER — Encounter: Payer: Self-pay | Admitting: Internal Medicine

## 2022-05-20 MED ORDER — LANTUS SOLOSTAR 100 UNIT/ML ~~LOC~~ SOPN: 20.0000 [IU] | PEN_INJECTOR | Freq: Two times a day (BID) | SUBCUTANEOUS | 11 refills | Status: DC

## 2022-05-20 MED ORDER — LOPERAMIDE HCL 2 MG PO TABS: 2.0000 mg | ORAL_TABLET | Freq: Four times a day (QID) | ORAL | 1 refills | Status: DC | PRN

## 2022-05-21 ENCOUNTER — Telehealth: Payer: Self-pay | Admitting: Cardiovascular Disease

## 2022-05-21 ENCOUNTER — Encounter: Payer: Self-pay | Admitting: Internal Medicine

## 2022-05-24 ENCOUNTER — Encounter: Payer: Self-pay | Admitting: Cardiovascular Disease

## 2022-05-24 NOTE — Progress Notes (Signed)
Patient ID: [name] [name], female   DOB: [birth year], 76 y.o.   MRN: [id number]        HPI: [name] [name] is a 76 y.o. female percents in the office today for a 7 month followup sleep/cardiology evaluation.  [name] [name] is a retired [employer]  who has a history of hypertension, obesity, psoriatic arthritis, DVT, which occurred while traveling to [place], as well as complex obstructive sleep apnea.  She has had difficulty in the past with CPAP therapy. She has been able to tolerate BiPAP and has had a [employer]ics BiPAP Auto unit well but also has had difficulty with some of her high pressure requirements. In April 2014 I changed her maximum BiPAP pressure to 19 and her maximum EPAP pressure to 15. I also reduced her minimum EPAP pressure to 6 and her minimal IPAP pressure to 10 with pressure support of 4 and changed her Bi_Flex to 3. She has been followed by [name].  Clinically, she had felt markedly improved with BiPAP.  Compared to prior CPAP therapy.  She has psoriatic arthritis and is on Humira as well as methotrexate followed by rheumatology.  She also has a Charcot joint in her right foot.   She is on Cymbalta for pain relief relative to this. She does not routinely exercise.  She keeps busy moving around, but typically does not do aerobic activity.  She has not been successful with significant weight loss.   She recently, she has been taking lisinopril HCTZ 20/12.5 mg for hypertension.  She had experienced an episode of chest discomfort which she felt was like a cramp and she noticed this most when standing up, but was associated with diaphoresis and shortness of breath.  She does have family history for coronary artery disease both in her mother as well as in her maternal grandmother. She has not been able to be very active due to her Charcot foot.  She also had noticed some mild shortness of breath with activity.  She eventually was referred for a nuclear perfusion study on 01/31/2015 which  revealed normal perfusion and function; ejection fraction 64%.  When I saw her in February 2017, her BiPAP machine had begun to fail.  She continues to use CPAP with 100% compliance and cannot sleep without it.  She typically sleeps for at least 8 hours per night.  She had taken her BiPAP machine to [name] who stated that her machine essentially had stopped working and is in need for new machine.   She received a new Resp[employer]ics [name] auto BiPAP unit in April 2017.  She uses a [name] FX full face mask, medium size.  She feels that the machine has been working well but she has had difficulty with her sleep.  At times she feels that she is not getting enough pressure.  Previously she had been set on a BiPAP auto unit with an EPAP minimum of 8 and IPAP max of 19 , which had worked well.  I obtained a new download today from 02/16/2016 through 03/16/2016.  She is 100% compliant and is averaging 8 hours and 25 minutes of sleep.  Apparently, she is not been set on auto and has been on and IPAP of 14 and an EPAP of 10.  Her AHI is increased at 25.1. I changed her BiPAP mode from a fixed pressure to auto BiPAP initiating atenolol over 6 with potential titration up to 20/16.  I also allowed for pressure support to change from 4-6 as  needed.  In 2018, she felt well from a cardiac perspective, but has had significant infections including MRSA, strep throat, UTI, and she had fractured her right ankle.  As result, she has noticed some ankle swelling right greater than left.  She continues to use her BiPAP and believes she is feeling much better than she had in the past.  [name] is her DME company.  A download in the office from 01/26/2017 through 04/25/2017.  She is 100% compliance.  She is averaging 8 hours and 36 minutes of sleep per night.  Her average device IPAP pressure was 18.2 and average device CPAP pressure greater than 90% of the time was 14.1.  AHI continued to be elevated at 21.2. An Epworth scale  score was calculated in the office today and this endorsed at 4, arguing against daytime sleepiness.  I last saw her, she was sleeping adequate duration.  Her AHI remains elevated I suggested that she reduce ramp time from 30 minutes down to 20 minutes and increased her starting pressure from 4 up to 6 cm.  I saw her in September 2019 at which time she had again started to notice  symptoms where she was waking up at night.  She noted leaks from her mask at higher pressures.  She feels that she is more  tired.  She had some swelling right lower extremity greater than left and she has a Charcot right foot.  She had broken her right ankle in August 2018.  She denied any chest pain.  She is unaware of any cardiac arrhythmias.  Her daughter, Dr. Thornton [name] is a GI physician who had been practicing in [name] and will be moving up to work with [name].  She is grateful that she will now have her twin granddaughters close by.  She was recently placed on [name] for her psoriatic arthritis to take in addition to her methotrexate.  She is followed by Dr. Gavin [name] for her rheumatologic issues.   When I evaluated her in December 2019 she wassleeping 8 hours 29 minutes and her average IPAP pressure greater than 90% 16.7 with an average EPAP pressure of 12.4.  I reduced her ramp time down to 20 minutes.  Having issues with her mask and I recommended a trial of the new DreamWear full facemask by either a [employer] or a [facility].  Over the past several months, she has not been able to be active due to her fracture of her fifth metatarsal.  She was in a hard cast for 2 months with no weightbearing.  As result she has not been able to exercise.  She has gained weight.  Was just removed several weeks ago.  She admits to ankle swelling.  She continues to use her BiPAP.  A new download was obtained and she again is compliant.  Her average device IPAP pressure greater than 90% of the time was 18.1 with  a maximum titrated IPAP pressure of 20.  AHI remained elevated at 17.9.    She went to [place] in January 2020 and unfortunately became ill and spent the entire trip in the hospital due to ischemic colitis.  She was subsequently evaluated by Fabian Sharp in a telemedicine visit in June 2020.  When I last evaluated her in a telemedicine visit in June 2021 she had not been able to do much walking because of tenderness from her bone on the plantar aspect of her foot.  She has fallen arches.  She  is being evaluated by Dr. Doran Durand.  Around Thanksgiving 2020 both she and her husband both became ill with COVID-19 pneumonia and both were hospitalized for approximately a week.  She had temperatures of 103, she was treated with remdesivir, convalescent plasma in addition to steroids.  She has subsequently not regained her sense of taste or smell and has continued to experience fatigability.  During that time she was not able to use BiPAP.  Apparently she was having issues stating that Medicare is going to deny her BiPAP.  She has continued to use BiPAP therapy.  I attempted to try to get a download from her [employer] website today but we were unable to do this in the office.  I have reached out to Barry Brunner who will contact [employer] to see if we can obtain her most recent download.  Ms. Burges continues to be on lisinopril/HCTZ for hypertension, rosuvastatin 5 mg for hyperlipidemia.  She was on methotrexate for rheumatoid arthritis but this was stopped approximately 6 weeks ago.  She is diabetic on metformin.     I scheduled her for a 2D echo Doppler study to reassess LV function following her Covid infection which was done on February 06, 2020.  LV function was excellent with EF of 65 to 70%.  There was grade 1 diastolic dysfunction.  There was mild mitral regurgitation.   I evaluated her in a telemedicine visit on May 24, 2020.  Since her last evaluation, her Respironics [name] has been on recall.  She has  registered with [employer] and attempt to get a new machine.  She had called the office shortly thereafter wondering if she should still use therapy.  At that time with the severity of her sleep apnea and the incidence of only 0.3% of devices with problems I had recommended she continue therapy.  She uses [name] as her DME company.  I asked her today if she had been contacted regarding insertion of a bacteria inlet filter device which she could place into her tubing but apparently this had not been done by [name].  She feels well.  She again has been immobile and that her left leg is in a hard cast making it difficult to walk.  She does note some occasional ankle swelling.  She was wondering if she should continue with aspirin which she has been taking 81 mg 3 times a week.  She admits to being under increased stress since her husband unfortunately had a brain bleed but is starting to improve.  She has been on lisinopril HCT 20/25 mg.  She has continued to be on lipid-lowering therapy with rosuvastatin 5 mg and LDL cholesterol was 40.   I last saw her in August 2022.  She was ill in November 2021 with an intestinal virus, she required ICU she tells me she was cared for by Dr. Cristina Gong.  He apparently had an extensive hospital stay.  She has had difficulty with long COVID resulting from her COVID infection in November 2020.  She recently has been under months of extensive care for her left foot ulcers requiring wound care visits for her previous infections which has resolved.  From a sleep perspective, she has had difficulty and recently adjustments were made to her [name] auto BiPAP therapy.  Her device is on recall and she is not been aware of any timeframe when potentially she can receive a new device.  Due to continued difficulty with her unit, approximately 1 month ago her pressures  her pressures were adjusted such that now her ramp time is set at 20 minutes, ramp start pressure 6, minimum EPAP 14,  maximum IPAP 25, with pressure support range of 3 to 8 cm of water.  Her most recent download shows 100% usage with average use at 9 hours and 49 minutes per night.  Her 90% pressures are 22.3/19.0 with maximum titrated pressures at 25/23.  Despite this average AHI continues to be elevated at 16.3.    I last saw her on October 20, 2021.  3 days previously she had just  received the new [name] BiPAP replacement device.  Her old device had died and she had received a "loaner "unit from her DME company.  Had lost 45 pounds since October 2022.  At times she  experiences some mild lightheadedness.  She was unaware of palpitations.    Since I last saw her, she was recently diagnosed with COVID infection last week after her son visited her and has been receiving molnuparivir therapy.  She has been using her new DreamWear Respironics auto bilevel device.  However, the device had not been linked to our office.  A download was able to be obtained through the card.  It appears that her minimum EPAP is set at 9 with maximum IPAP 22 with a pressure support range of 0 to 5 cm.  Her humidification is at 3 and she does admit to dry mouth.  A download demonstrates increased AHI at 32.5 with device IPAP EPAP pressure greater than 90% of the time at 17.  As result it does not appear that there has been a pressure support and essentially for pressure support has been 0.  She does admit to some feet swelling.  She denies chest pain.  She presents for evaluation.   Past Medical History:  Diagnosis Date   Arthritis    Closed fracture of fifth metatarsal bone 05/13/2018   Deep vein thrombosis (HCC)    right calf - 05/2012    Diabetes mellitus without complication (HCC)    diet controlled    GERD (gastroesophageal reflux disease)    Hyperlipidemia    Hypertension    Ejection fraction =>55% Left ventricular systolic function is normal. Left ventricular wall motion is normal     Lymphocytic colitis    Neuropathy     diabetic - in bilateral feet    Pityriasis lichenoides chronica    Sleep apnea    bipap    Past Surgical History:  Procedure Laterality Date   DILATION AND CURETTAGE OF UTERUS     EYE SURGERY  07/2020   HAMMER TOE SURGERY     right hand surgery      due to blood infection    torn meniscus repair      right knee    TOTAL HIP ARTHROPLASTY  09/13/2012   Procedure: TOTAL HIP ARTHROPLASTY ANTERIOR APPROACH;  Surgeon: Mauri Pole, MD;  Location: [facility];  Service: Orthopedics;  Laterality: Right;    Allergies  Allergen Reactions   Other Shortness Of Breath    Blue fish: palms and feet turn red   Thorazine [Chlorpromazine] Other (See Comments)    Made her more more off balanced, vision   Bee Venom Swelling   Farxiga [Dapagliflozin]     Recurrent yeast infections   Semaglutide     Rybelsus - lip swelling, GI upset   Sulfa Antibiotics Hives    Current Outpatient Medications  Medication Sig Dispense Refill   Accu-Chek  Softclix Lancets lancets Use as instructed to check sugars 2-3 times daily.  E11.42 100 each 12   acetaminophen (TYLENOL) 325 MG tablet Take 2 tablets (650 mg total) by mouth every 6 (six) hours as needed for mild pain or headache.     Alcohol Swabs PADS UAD to check sugars.  E11.65 100 each 12   allopurinol (ZYLOPRIM) 100 MG tablet TAKE 1 TABLET(100 MG) BY MOUTH TWICE DAILY 180 tablet 3   ALPRAZolam (XANAX) 0.25 MG tablet Take 1 tablet (0.25 mg total) by mouth 3 (three) times daily as needed for anxiety. 20 tablet 0   ascorbic acid (VITAMIN C) 500 MG tablet Take 500 mg by mouth at bedtime.     augmented betamethasone dipropionate (DIPROLENE-AF) 0.05 % cream      Blood Glucose Monitoring Suppl (ACCU-CHEK GUIDE) w/Device KIT AS DIRECTED 1 kit 0   Budesonide ER 9 MG CP24 Take 9 mg by mouth daily in the afternoon. 90 capsule 1   Certolizumab Pegol ([name] [place]) Inject 400 mg into the skin every 30 (thirty) days. 200 mg on each side of stomach     Cholecalciferol (VITAMIN  D3) 1000 UNITS CAPS Take 1,000 Units by mouth 2 (two) times daily.      ciclopirox (LOPROX) 0.77 % cream Apply topically.     clobetasol cream (TEMOVATE) 6.29 % Apply 1 application. topically 2 (two) times daily.     Cyanocobalamin (VITAMIN B-12) 1000 MCG SUBL Place 1,000 mcg under the tongue daily.     cyclobenzaprine (FLEXERIL) 10 MG tablet TAKE 1 TABLET BY MOUTH AT BEDTIME 90 tablet 1   docusate sodium (COLACE) 100 MG capsule TAKE 1 CAPSULE(100 MG) BY MOUTH TWICE DAILY 60 capsule 2   folic acid (FOLVITE) 1 MG tablet TAKE 1 TABLET(1 MG) BY MOUTH DAILY 90 tablet 3   glucose blood (ACCU-CHEK GUIDE) test strip Use as instructed to check sugars 2-3 times daily.  E11.42 100 each 12   hydrochlorothiazide (MICROZIDE) 12.5 MG capsule Take 1 capsule (12.5 mg total) by mouth as needed (for swelling). 30 capsule 3   HYDROcodone-acetaminophen (NORCO) 10-325 MG tablet Take 1 tablet by mouth 3 (three) times daily as needed.     Insulin Pen Needle (B-D UF III MINI PEN NEEDLES) 31G X 5 MM MISC Use daily for insulin pen 90 each 3   lisinopril (ZESTRIL) 20 MG tablet Take 1 tablet (20 mg total) by mouth daily. 90 tablet 3   Melatonin 5 MG SUBL Place 5 mg under the tongue at bedtime.     meloxicam (MOBIC) 7.5 MG tablet Take 7.5 mg by mouth daily.     metFORMIN (GLUCOPHAGE-XR) 500 MG 24 hr tablet TAKE 1 TABLET(500 MG) BY MOUTH TWICE DAILY 180 tablet 2   methotrexate (RHEUMATREX) 2.5 MG tablet Take 20 mg by mouth every Friday.      naftifine (NAFTIN) 1 % cream Apply 1 application. topically daily as needed (rash).     omeprazole (PRILOSEC) 40 MG capsule TAKE 1 CAPSULE BY MOUTH EVERY DAY 90 capsule 1   potassium chloride (KLOR-CON M) 10 MEQ tablet TAKE 3 TABLETS(30 MEQ) BY MOUTH DAILY 270 tablet 3   Probiotic Product (ALIGN PO) Take 1 capsule by mouth daily.     rosuvastatin (CRESTOR) 5 MG tablet TAKE 1 TABLET(5 MG) BY MOUTH DAILY 90 tablet 3   Semaglutide (RYBELSUS) 3 MG TABS Take by mouth.     sertraline (ZOLOFT)  100 MG tablet TAKE 1 TABLET(100 MG) BY MOUTH DAILY 90 tablet  1   azelastine (ASTELIN) 0.1 % nasal spray INSTILL 2 SPRAYS IN EACH NOSTRIL TWICE DAILY 30 mL 12   carvedilol (COREG) 6.25 MG tablet TAKE 1 TABLET(6.25 MG) BY MOUTH TWICE DAILY WITH A MEAL 60 tablet 3   insulin glargine (LANTUS SOLOSTAR) 100 UNIT/ML Solostar Pen Inject 20 Units into the skin 2 (two) times daily. 15 mL 11   loperamide (IMODIUM A-D) 2 MG tablet Take 1 tablet (2 mg total) by mouth 4 (four) times daily as needed for diarrhea or loose stools. 360 tablet 1   No current facility-administered medications for this visit.    Socially she is widowed. She has 2 children and 2 grandchildren. Her daughter is a Engineer, drilling. There is no tobacco use. She does drink occasional alcohol.  ROS General: Negative; No fevers, chills, or night sweats; 45 [name] weight loss since October 2022 HEENT: Negative; No changes in vision or hearing, sinus congestion, difficulty swallowing Pulmonary: Negative; No cough, wheezing, shortness of breath, hemoptysis Cardiovascular: Negative; No chest pain, presyncope, syncope, palpitations; occasional feet swelling GI: History of lymphocytic colitis GU: Negative; No dysuria, hematuria, or difficulty voiding Musculoskeletal: Positive for R foot charcot joint; recent right ankle fracture Hematologic/Oncology: Negative; no easy bruising, bleeding Rheumatologic: Psoriatic arthritis;  History of gout Endocrine: Negative; no heat/cold intolerance; no diabetes Neuro: Negative; no changes in balance, headaches Skin: History of psoriatic arthritis Psychiatric: Negative; No behavioral problems, depression Sleep: Positive for complex sleep apnea on BiPAP therapy;  No residual snoring, daytime sleepiness, hypersomnolence, bruxism, restless legs, hypnogognic hallucinations, no cataplexy Other comprehensive 14 point system review is negative.  PE BP 134/68 (BP Location: Left Arm, Patient Position: Sitting)   Pulse  65   Wt 207 lb (93.9 kg)   SpO2 96%   BMI 34.45 kg/m    Repeat blood pressure by me was 130/70  Wt Readings from Last 3 Encounters:  05/18/22 207 lb (93.9 kg)  03/12/22 200 lb 4 oz (90.8 kg)  02/05/22 200 lb (90.7 kg)   General: Alert, oriented, no distress.  Skin: normal turgor, no rashes, warm and dry HEENT: Normocephalic, atraumatic. Pupils equal round and reactive to light; sclera anicteric; extraocular muscles intact;  Nose without nasal septal hypertrophy Mouth/Parynx; wearing a mask with recent COVID positivity Neck: No JVD, no carotid bruits; normal carotid upstroke Lungs: clear to ausculatation and percussion; no wheezing or rales Chest wall: without tenderness to palpitation Heart: PMI not displaced, RRR, s1 s2 normal, 1/6 systolic murmur, no diastolic murmur, no rubs, gallops, thrills, or heaves Abdomen: soft, nontender; no hepatosplenomehaly, BS+; abdominal aorta nontender and not dilated by palpation. Back: no CVA tenderness Pulses 2+ Musculoskeletal: full range of motion, normal strength, no joint deformities Extremities: no clubbing cyanosis or edema, Homan's sign negative  Neurologic: grossly nonfocal; Cranial nerves grossly wnl Psychologic: Normal mood and affect    October 20, 2021 ECG (independently read by me):  Sinus rhythm at 77, PAC, IRBBB,     April 07, 2021 ECG (independently read by me): NSR at 75, RBBB, no ectopy  December 2019 ECG (independently read by me): Sinus rhythm at 78 bpm.  Mild sinus arrhythmia.  Right bundle branch block with repolarization changes.  April 2019 ECG (independently read by me): Normal sinus rhythm at 83 bpm.  Right bundle branch block with repolarization changes.  QTc interval 4 6 0 ms.  September 2018 ECG (independently read by me): normal sinus rhythm at 79 bpm.  Right bundle-branch block.  QTc interval 481 ms.  June  2016 ECG (independently read by me):  Normal sinus rhythm with right bundle branch block.  October 2015  ECG (independently read by me): Normal sinus rhythm at 71 beats per minute right bundle branch block with repolarization changes.  Normal intervals.  Prior July 2014 ECG sinus rhythm with incomplete right bundle branch block. QRS duration 118 ms. Nonspecific ST-T changes.  LABS:    Latest Ref Rng & Units 03/12/2022   10:26 AM 09/16/2021    9:51 AM 11/15/2020   12:02 PM  BMP  Glucose 70 - 99 mg/dL 87  121  220   BUN 6 - 23 mg/dL $Remove'20  18  12   '[id number]$ Creatinine 0.40 - 1.20 mg/dL 0.85  0.88  0.63   Sodium 135 - 145 mEq/L 135  134  136   Potassium 3.5 - 5.1 mEq/L 4.5  4.9  3.3   Chloride 96 - 112 mEq/L 100  96  96   CO2 19 - 32 mEq/L 29  32  31   Calcium 8.4 - 10.5 mg/dL 10.1  10.9  9.2       Latest Ref Rng & Units 03/12/2022   10:26 AM 09/16/2021    9:51 AM 11/15/2020   12:02 PM  Hepatic Function  Total Protein 6.0 - 8.3 g/dL 6.7  6.9  5.8   Albumin 3.5 - 5.2 g/dL 3.9  4.1  3.2   AST 0 - 37 U/L 20  35  33   ALT 0 - 35 U/L 16  31  45   Alk Phosphatase 39 - 117 U/L 65  59  49   Total Bilirubin 0.2 - 1.2 mg/dL 0.4  0.6  0.3       Latest Ref Rng & Units 03/12/2022   10:26 AM 05/27/2021    3:00 PM 11/15/2020   12:02 PM  CBC  WBC 4.0 - 10.5 K/uL 12.9  11.9  9.3   Hemoglobin 12.0 - 15.0 g/dL 11.1  11.8  10.5   Hematocrit 36.0 - 46.0 % 35.1  37.7  31.7   Platelets 150.0 - 400.0 K/uL 285.0  253  200.0    Lab Results  Component Value Date   MCV 91.6 03/12/2022   MCV 90.4 05/27/2021   MCV 89.8 11/15/2020   Lab Results  Component Value Date   TSH 3.249 07/16/2020    Lab Results  Component Value Date   HGBA1C 6.6 (H) 03/12/2022    Lipid Panel     Component Value Date/Time   CHOL 134 03/12/2022 1026   TRIG 121.0 03/12/2022 1026   HDL 49.60 03/12/2022 1026   CHOLHDL 3 03/12/2022 1026   VLDL 24.2 03/12/2022 1026   LDLCALC 61 03/12/2022 1026   LDLDIRECT 139.0 08/15/2019 1114      RADIOLOGY: No results found.  IMPRESSION:  1. OSA (obstructive sleep apnea) on BiPAP   2. Essential  hypertension   3. Ankle edema   4. Psoriatic arthritis ([place]) -Dr. Trudie Reed   5. Hyperlipidemia with target LDL less than 70   6. Type 2 diabetes mellitus with complication, without long-term current use of insulin (HCC)   7. Right bundle branch block     ASSESSMENT AND PLAN: Ms. 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   She sees Dr. [name] for rheumatology and had seen Dr. Cristina Gong for her lymphocytic colitis.  She had  broken her ankle remotely which had significantly limited her mobility and ability to walk and exercise.  She has had significant difficulty requiring a longstanding hospitalization with GI issues.  In addition she has been plagued by a long COVID syndrome and her initial protracted COVID infection from November 2020.  She has been undergoing wound care for an infection in her left foot which has been an issue since October 2021 and had been seeing Dr. Doran Durand and Jeri Cos, PA-C at wound clinic with resolution of her prior infection.  Most recently she is being cared in [place] at [place].  Her son recently visited her and she tested positive for COVID last week and has been receiving molnupiravir for the past 4 days.  From a sleep perspective, she had been using a [employer] auto BiPAP unit which was under recall.  Her old machine ultimately malfunctioned and she was given a loaner device which she had been using through her DME company.  After a very long delay, she finally received a new [employer]ics DreamStation auto BiPAP device.  [name] is her DME company.  This needs to be linked to our office.  However we were able to obtain data from my card today.  It appears that she may not be having any pressure support since her device IPAP pressure greater than 90% of the time was 17.4 with device CPAP pressure at 17.0.  Her AHI is significantly increased  at 32.5 present study and her maximum IPAP titration was 22 as well as maximum titrated EPAP pressure.  Consequently, I will change her settings.  I will change her maximal IPAP setting to 25 and we will set her EPAP minimum pressure to 10.  I am recommending we change in heavy pressure support range of 5 to 8 cm of water.  I will ask our sleep coordinator to obtain a download on these new device settings after several weeks.  She also has had difficulty with the medication and we will increase her humidifier to 4.  She continues to be on carvedilol 6.25 mg twice a day, lisinopril 20 mg daily for hypertension she continues to be on low-dose rosuvastatin 5 mg for hyperlipidemia.  She is diabetic on metformin.  She continues to be on methotrexate.  I have given her a prescription for HCTZ 12.5 mg to take on a as needed basis for leg swelling.  I will see her in several months for follow-up evaluation or sooner as needed.    Troy Sine, MD, [facility]  05/24/2022 1:34 PM

## 2022-05-25 NOTE — Telephone Encounter (Signed)
Patient having pressure issues. Call was deferred to [facility] with [employer].

## 2022-06-01 DIAGNOSIS — E119 Type 2 diabetes mellitus without complications: Secondary | ICD-10-CM | POA: Diagnosis not present

## 2022-06-01 DIAGNOSIS — H524 Presbyopia: Secondary | ICD-10-CM | POA: Diagnosis not present

## 2022-06-01 LAB — HM DIABETES EYE EXAM

## 2022-06-03 DIAGNOSIS — M5451 Vertebrogenic low back pain: Secondary | ICD-10-CM | POA: Diagnosis not present

## 2022-06-03 DIAGNOSIS — R2689 Other abnormalities of gait and mobility: Secondary | ICD-10-CM | POA: Diagnosis not present

## 2022-06-03 DIAGNOSIS — R2681 Unsteadiness on feet: Secondary | ICD-10-CM | POA: Diagnosis not present

## 2022-06-03 DIAGNOSIS — M6281 Muscle weakness (generalized): Secondary | ICD-10-CM | POA: Diagnosis not present

## 2022-06-05 ENCOUNTER — Other Ambulatory Visit: Payer: Self-pay

## 2022-06-05 ENCOUNTER — Other Ambulatory Visit: Payer: Self-pay | Admitting: Internal Medicine

## 2022-06-05 MED ORDER — LOPERAMIDE HCL 2 MG PO TABS: 2.0000 mg | ORAL_TABLET | Freq: Four times a day (QID) | ORAL | 3 refills | Status: DC | PRN

## 2022-06-08 DIAGNOSIS — R2681 Unsteadiness on feet: Secondary | ICD-10-CM | POA: Diagnosis not present

## 2022-06-08 DIAGNOSIS — R2689 Other abnormalities of gait and mobility: Secondary | ICD-10-CM | POA: Diagnosis not present

## 2022-06-08 DIAGNOSIS — M5451 Vertebrogenic low back pain: Secondary | ICD-10-CM | POA: Diagnosis not present

## 2022-06-08 DIAGNOSIS — M6281 Muscle weakness (generalized): Secondary | ICD-10-CM | POA: Diagnosis not present

## 2022-06-09 DIAGNOSIS — L4059 Other psoriatic arthropathy: Secondary | ICD-10-CM | POA: Diagnosis not present

## 2022-06-11 ENCOUNTER — Other Ambulatory Visit: Payer: Self-pay

## 2022-06-11 DIAGNOSIS — L408 Other psoriasis: Secondary | ICD-10-CM | POA: Diagnosis not present

## 2022-06-11 DIAGNOSIS — M1009 Idiopathic gout, multiple sites: Secondary | ICD-10-CM | POA: Diagnosis not present

## 2022-06-11 DIAGNOSIS — K5289 Other specified noninfective gastroenteritis and colitis: Secondary | ICD-10-CM | POA: Diagnosis not present

## 2022-06-11 DIAGNOSIS — E669 Obesity, unspecified: Secondary | ICD-10-CM | POA: Diagnosis not present

## 2022-06-11 DIAGNOSIS — L4059 Other psoriatic arthropathy: Secondary | ICD-10-CM | POA: Diagnosis not present

## 2022-06-11 DIAGNOSIS — Z79899 Other long term (current) drug therapy: Secondary | ICD-10-CM | POA: Diagnosis not present

## 2022-06-11 DIAGNOSIS — Z6832 Body mass index (BMI) 32.0-32.9, adult: Secondary | ICD-10-CM | POA: Diagnosis not present

## 2022-06-11 MED ORDER — ACCU-CHEK GUIDE VI STRP: ORAL_STRIP | 12 refills | Status: DC

## 2022-06-12 ENCOUNTER — Other Ambulatory Visit: Payer: Self-pay | Admitting: Internal Medicine

## 2022-06-12 DIAGNOSIS — R2689 Other abnormalities of gait and mobility: Secondary | ICD-10-CM | POA: Diagnosis not present

## 2022-06-12 DIAGNOSIS — M6281 Muscle weakness (generalized): Secondary | ICD-10-CM | POA: Diagnosis not present

## 2022-06-12 DIAGNOSIS — R2681 Unsteadiness on feet: Secondary | ICD-10-CM | POA: Diagnosis not present

## 2022-06-12 DIAGNOSIS — M5451 Vertebrogenic low back pain: Secondary | ICD-10-CM | POA: Diagnosis not present

## 2022-06-14 ENCOUNTER — Other Ambulatory Visit: Payer: Self-pay | Admitting: Internal Medicine

## 2022-06-15 DIAGNOSIS — M6281 Muscle weakness (generalized): Secondary | ICD-10-CM | POA: Diagnosis not present

## 2022-06-15 DIAGNOSIS — R2689 Other abnormalities of gait and mobility: Secondary | ICD-10-CM | POA: Diagnosis not present

## 2022-06-15 DIAGNOSIS — R2681 Unsteadiness on feet: Secondary | ICD-10-CM | POA: Diagnosis not present

## 2022-06-15 DIAGNOSIS — M5451 Vertebrogenic low back pain: Secondary | ICD-10-CM | POA: Diagnosis not present

## 2022-06-16 ENCOUNTER — Other Ambulatory Visit: Payer: Self-pay

## 2022-06-16 DIAGNOSIS — E1142 Type 2 diabetes mellitus with diabetic polyneuropathy: Secondary | ICD-10-CM

## 2022-06-16 MED ORDER — ACCU-CHEK GUIDE VI STRP: ORAL_STRIP | 12 refills | Status: DC

## 2022-06-21 ENCOUNTER — Encounter: Payer: Self-pay | Admitting: Internal Medicine

## 2022-06-22 DIAGNOSIS — R2681 Unsteadiness on feet: Secondary | ICD-10-CM | POA: Diagnosis not present

## 2022-06-22 DIAGNOSIS — M6281 Muscle weakness (generalized): Secondary | ICD-10-CM | POA: Diagnosis not present

## 2022-06-22 DIAGNOSIS — R2689 Other abnormalities of gait and mobility: Secondary | ICD-10-CM | POA: Diagnosis not present

## 2022-06-22 DIAGNOSIS — M5451 Vertebrogenic low back pain: Secondary | ICD-10-CM | POA: Diagnosis not present

## 2022-06-24 DIAGNOSIS — R2681 Unsteadiness on feet: Secondary | ICD-10-CM | POA: Diagnosis not present

## 2022-06-24 DIAGNOSIS — M5451 Vertebrogenic low back pain: Secondary | ICD-10-CM | POA: Diagnosis not present

## 2022-06-24 DIAGNOSIS — R2689 Other abnormalities of gait and mobility: Secondary | ICD-10-CM | POA: Diagnosis not present

## 2022-06-24 DIAGNOSIS — M6281 Muscle weakness (generalized): Secondary | ICD-10-CM | POA: Diagnosis not present

## 2022-06-29 DIAGNOSIS — M6281 Muscle weakness (generalized): Secondary | ICD-10-CM | POA: Diagnosis not present

## 2022-06-29 DIAGNOSIS — M5451 Vertebrogenic low back pain: Secondary | ICD-10-CM | POA: Diagnosis not present

## 2022-06-29 DIAGNOSIS — R2689 Other abnormalities of gait and mobility: Secondary | ICD-10-CM | POA: Diagnosis not present

## 2022-06-29 DIAGNOSIS — R2681 Unsteadiness on feet: Secondary | ICD-10-CM | POA: Diagnosis not present

## 2022-06-30 ENCOUNTER — Other Ambulatory Visit: Payer: Self-pay | Admitting: Internal Medicine

## 2022-07-01 DIAGNOSIS — R2689 Other abnormalities of gait and mobility: Secondary | ICD-10-CM | POA: Diagnosis not present

## 2022-07-01 DIAGNOSIS — R2681 Unsteadiness on feet: Secondary | ICD-10-CM | POA: Diagnosis not present

## 2022-07-01 DIAGNOSIS — M5451 Vertebrogenic low back pain: Secondary | ICD-10-CM | POA: Diagnosis not present

## 2022-07-01 DIAGNOSIS — M6281 Muscle weakness (generalized): Secondary | ICD-10-CM | POA: Diagnosis not present

## 2022-07-08 DIAGNOSIS — M6281 Muscle weakness (generalized): Secondary | ICD-10-CM | POA: Diagnosis not present

## 2022-07-08 DIAGNOSIS — R2689 Other abnormalities of gait and mobility: Secondary | ICD-10-CM | POA: Diagnosis not present

## 2022-07-08 DIAGNOSIS — R2681 Unsteadiness on feet: Secondary | ICD-10-CM | POA: Diagnosis not present

## 2022-07-08 DIAGNOSIS — M5451 Vertebrogenic low back pain: Secondary | ICD-10-CM | POA: Diagnosis not present

## 2022-07-09 DIAGNOSIS — L4059 Other psoriatic arthropathy: Secondary | ICD-10-CM | POA: Diagnosis not present

## 2022-07-09 DIAGNOSIS — Z79899 Other long term (current) drug therapy: Secondary | ICD-10-CM | POA: Diagnosis not present

## 2022-07-10 DIAGNOSIS — M5451 Vertebrogenic low back pain: Secondary | ICD-10-CM | POA: Diagnosis not present

## 2022-07-10 DIAGNOSIS — R2689 Other abnormalities of gait and mobility: Secondary | ICD-10-CM | POA: Diagnosis not present

## 2022-07-10 DIAGNOSIS — R2681 Unsteadiness on feet: Secondary | ICD-10-CM | POA: Diagnosis not present

## 2022-07-10 DIAGNOSIS — M6281 Muscle weakness (generalized): Secondary | ICD-10-CM | POA: Diagnosis not present

## 2022-07-13 DIAGNOSIS — M25561 Pain in right knee: Secondary | ICD-10-CM | POA: Diagnosis not present

## 2022-07-13 DIAGNOSIS — M25562 Pain in left knee: Secondary | ICD-10-CM | POA: Diagnosis not present

## 2022-07-14 ENCOUNTER — Encounter: Payer: Self-pay | Admitting: Internal Medicine

## 2022-07-14 DIAGNOSIS — M47896 Other spondylosis, lumbar region: Secondary | ICD-10-CM | POA: Diagnosis not present

## 2022-07-14 DIAGNOSIS — M5136 Other intervertebral disc degeneration, lumbar region: Secondary | ICD-10-CM | POA: Diagnosis not present

## 2022-07-14 NOTE — Patient Instructions (Addendum)
      Blood work was ordered.   The lab is on the first floor.    Medications changes include :       A referral was ordered for XXX.     Someone will call you to schedule an appointment.    Return in about 6 months (around 01/14/2023) for follow up.

## 2022-07-14 NOTE — Progress Notes (Signed)
Subjective:    Patient ID: [name] [name], female    DOB: Mar 09, 1946, 76 y.o.   MRN: [id number]     HPI [name] [name] is here for follow up of her chronic medical problems, including diabetes w/ neuropathy, htn, hld, depression, anxiety, gout    Medications and allergies reviewed with patient and updated if appropriate.  Current Outpatient Medications on File Prior to Visit  Medication Sig Dispense Refill   Accu-Chek Softclix Lancets lancets Use as instructed to check sugars 2-3 times daily.  E11.42 100 each 12   acetaminophen (TYLENOL) 325 MG tablet Take 2 tablets (650 mg total) by mouth every 6 (six) hours as needed for mild pain or headache.     Alcohol Swabs PADS UAD to check sugars.  E11.65 100 each 12   allopurinol (ZYLOPRIM) 100 MG tablet TAKE 1 TABLET(100 MG) BY MOUTH TWICE DAILY 180 tablet 3   ALPRAZolam (XANAX) 0.25 MG tablet Take 1 tablet (0.25 mg total) by mouth 3 (three) times daily as needed for anxiety. 20 tablet 0   ascorbic acid (VITAMIN C) 500 MG tablet Take 500 mg by mouth at bedtime.     augmented betamethasone dipropionate (DIPROLENE-AF) 0.05 % cream      azelastine (ASTELIN) 0.1 % nasal spray INSTILL 2 SPRAYS IN EACH NOSTRIL TWICE DAILY 30 mL 12   Blood Glucose Monitoring Suppl (ACCU-CHEK GUIDE) w/Device KIT AS DIRECTED 1 kit 0   Budesonide ER 9 MG CP24 Take 9 mg by mouth daily in the afternoon. 90 capsule 1   carvedilol (COREG) 6.25 MG tablet TAKE 1 TABLET(6.25 MG) BY MOUTH TWICE DAILY WITH A MEAL 60 tablet 3   Certolizumab Pegol (CIMZIA [place]) Inject 400 mg into the skin every 30 (thirty) days. 200 mg on each side of stomach     Cholecalciferol (VITAMIN D3) 1000 UNITS CAPS Take 1,000 Units by mouth 2 (two) times daily.      ciclopirox (LOPROX) 0.77 % cream Apply topically.     clobetasol cream (TEMOVATE) 3.57 % Apply 1 application. topically 2 (two) times daily.     Cyanocobalamin (VITAMIN B-12) 1000 MCG SUBL Place 1,000 mcg under the tongue daily.      cyclobenzaprine (FLEXERIL) 10 MG tablet TAKE 1 TABLET BY MOUTH AT BEDTIME 90 tablet 1   docusate sodium (COLACE) 100 MG capsule TAKE 1 CAPSULE(100 MG) BY MOUTH TWICE DAILY 60 capsule 2   folic acid (FOLVITE) 1 MG tablet TAKE 1 TABLET(1 MG) BY MOUTH DAILY 90 tablet 3   glucose blood (ACCU-CHEK GUIDE) test strip USE AS INSTRUCTED TO CHECK SUGARS 2-3 TIMES DAILY 100 strip 12   hydrochlorothiazide (MICROZIDE) 12.5 MG capsule Take 1 capsule (12.5 mg total) by mouth as needed (for swelling). 30 capsule 3   HYDROcodone-acetaminophen (NORCO) 10-325 MG tablet Take 1 tablet by mouth 3 (three) times daily as needed.     insulin detemir (LEVEMIR FLEXPEN) 100 UNIT/ML FlexPen INJECT 25 UNITS UNDER THE SKIN TWICE DAILY 15 mL 16   insulin glargine (LANTUS SOLOSTAR) 100 UNIT/ML Solostar Pen Inject 20 Units into the skin 2 (two) times daily. 15 mL 11   Insulin Pen Needle (B-D UF III MINI PEN NEEDLES) 31G X 5 MM MISC Use daily for insulin pen 90 each 3   lisinopril (ZESTRIL) 20 MG tablet Take 1 tablet (20 mg total) by mouth daily. 90 tablet 3   loperamide (IMODIUM A-D) 2 MG tablet Take 1 tablet (2 mg total) by mouth 4 (four) times daily  as needed for diarrhea or loose stools. 360 tablet 3   Melatonin 5 MG SUBL Place 5 mg under the tongue at bedtime.     meloxicam (MOBIC) 7.5 MG tablet Take 7.5 mg by mouth daily.     metFORMIN (GLUCOPHAGE-XR) 500 MG 24 hr tablet TAKE 1 TABLET(500 MG) BY MOUTH TWICE DAILY 180 tablet 2   methotrexate (RHEUMATREX) 2.5 MG tablet Take 20 mg by mouth every Friday.      naftifine (NAFTIN) 1 % cream Apply 1 application. topically daily as needed (rash).     omeprazole (PRILOSEC) 40 MG capsule TAKE 1 CAPSULE BY MOUTH EVERY DAY 90 capsule 1   potassium chloride (KLOR-CON M) 10 MEQ tablet TAKE 3 TABLETS(30 MEQ) BY MOUTH DAILY 270 tablet 3   Probiotic Product (ALIGN PO) Take 1 capsule by mouth daily.     rosuvastatin (CRESTOR) 5 MG tablet TAKE 1 TABLET(5 MG) BY MOUTH DAILY 90 tablet 3    Semaglutide (RYBELSUS) 3 MG TABS Take by mouth.     sertraline (ZOLOFT) 100 MG tablet TAKE 1 TABLET(100 MG) BY MOUTH DAILY 90 tablet 1   No current facility-administered medications on file prior to visit.     Review of Systems     Objective:  There were no vitals filed for this visit. BP Readings from Last 3 Encounters:  05/18/22 134/68  03/12/22 122/78  02/05/22 130/68   Wt Readings from Last 3 Encounters:  05/18/22 207 lb (93.9 kg)  03/12/22 200 lb 4 oz (90.8 kg)  02/05/22 200 lb (90.7 kg)   There is no height or weight on file to calculate BMI.    Physical Exam     Lab Results  Component Value Date   WBC 12.9 (H) 03/12/2022   HGB 11.1 (L) 03/12/2022   HCT 35.1 (L) 03/12/2022   PLT 285.0 03/12/2022   GLUCOSE 87 03/12/2022   CHOL 134 03/12/2022   TRIG 121.0 03/12/2022   HDL 49.60 03/12/2022   LDLDIRECT 139.0 08/15/2019   LDLCALC 61 03/12/2022   ALT 16 03/12/2022   AST 20 03/12/2022   NA 135 03/12/2022   K 4.5 03/12/2022   CL 100 03/12/2022   CREATININE 0.85 03/12/2022   BUN 20 03/12/2022   CO2 29 03/12/2022   TSH 3.249 07/16/2020   INR 1.14 09/13/2012   HGBA1C 6.6 (H) 03/12/2022     Assessment & Plan:    See Problem List for Assessment and Plan of chronic medical problems.

## 2022-07-15 ENCOUNTER — Ambulatory Visit (INDEPENDENT_AMBULATORY_CARE_PROVIDER_SITE_OTHER): Payer: Medicare Other | Admitting: Internal Medicine

## 2022-07-15 VITALS — BP 132/68 | HR 61 | Temp 98.0°F | Ht 65.0 in

## 2022-07-15 DIAGNOSIS — T148XXA Other injury of unspecified body region, initial encounter: Secondary | ICD-10-CM | POA: Insufficient documentation

## 2022-07-15 DIAGNOSIS — I10 Essential (primary) hypertension: Secondary | ICD-10-CM

## 2022-07-15 DIAGNOSIS — E1142 Type 2 diabetes mellitus with diabetic polyneuropathy: Secondary | ICD-10-CM

## 2022-07-15 DIAGNOSIS — F419 Anxiety disorder, unspecified: Secondary | ICD-10-CM

## 2022-07-15 DIAGNOSIS — E785 Hyperlipidemia, unspecified: Secondary | ICD-10-CM

## 2022-07-15 DIAGNOSIS — F3289 Other specified depressive episodes: Secondary | ICD-10-CM

## 2022-07-15 DIAGNOSIS — M109 Gout, unspecified: Secondary | ICD-10-CM

## 2022-07-15 NOTE — Assessment & Plan Note (Signed)
Acute Left superior knee/distal anterior thigh-approximately 5 cm with surrounding ecchymosis, nontender Also has mild bruising in the same area on her right knee without hematoma Developed over 2 weeks ago Denies any injury or even mild trauma ?  Related to mild trauma that she did not realize occurred for the middle and spontaneous bleed from varicose vein Reassured her this does not look like anything concerning Platelet count was normal last week, mild chronic anemia She is on some medications that could potentially increase her risk of bleeding, but no blood thinner No further evaluation needed, no treatment needed She will monitor closely and call with concerns

## 2022-07-15 NOTE — Assessment & Plan Note (Signed)
Chronic Blood pressure well controlled Continue carvedilol 6.25 mg twice daily, lisinopril 20 mg daily

## 2022-07-16 ENCOUNTER — Encounter: Payer: Self-pay | Admitting: Internal Medicine

## 2022-07-16 NOTE — Progress Notes (Signed)
Outside notes received. Information abstracted. Notes sent to scan.  

## 2022-07-20 DIAGNOSIS — M6281 Muscle weakness (generalized): Secondary | ICD-10-CM | POA: Diagnosis not present

## 2022-07-20 DIAGNOSIS — R2681 Unsteadiness on feet: Secondary | ICD-10-CM | POA: Diagnosis not present

## 2022-07-20 DIAGNOSIS — R2689 Other abnormalities of gait and mobility: Secondary | ICD-10-CM | POA: Diagnosis not present

## 2022-07-20 DIAGNOSIS — M5451 Vertebrogenic low back pain: Secondary | ICD-10-CM | POA: Diagnosis not present

## 2022-07-23 ENCOUNTER — Encounter: Payer: Self-pay | Admitting: Internal Medicine

## 2022-07-24 ENCOUNTER — Telehealth: Payer: Self-pay | Admitting: Internal Medicine

## 2022-07-24 ENCOUNTER — Other Ambulatory Visit: Payer: Self-pay | Admitting: Internal Medicine

## 2022-07-24 MED ORDER — AMOXICILLIN-POT CLAVULANATE 875-125 MG PO TABS: 1.0000 | ORAL_TABLET | Freq: Two times a day (BID) | ORAL | 0 refills | Status: AC

## 2022-07-24 NOTE — Telephone Encounter (Signed)
Left message for patient to return call to clinic regarding her knee.  Please send to me if she calls back.

## 2022-07-24 NOTE — Telephone Encounter (Signed)
See which she thinks ----as discussed

## 2022-07-24 NOTE — Telephone Encounter (Signed)
Pt saw Dr. Quay Burow for blood blister on knee on 12.6.23, pt thinks blister has become infected. Pt states there are tiny holes along the wound and the wound has turned very red and is painful to the touch.  Pt is [id number]  Phone: [phone] Fax: [phone]

## 2022-07-24 NOTE — Telephone Encounter (Signed)
Augmentin sent to pharmacy.  MyChart message sent to patient.

## 2022-07-24 NOTE — Telephone Encounter (Signed)
Spoke with patient today.  Patient states leg is red and warm to the touch.  Spoke with Dr. Quay Burow and she will send in script.

## 2022-07-25 ENCOUNTER — Encounter: Payer: Self-pay | Admitting: Internal Medicine

## 2022-07-25 DIAGNOSIS — M545 Low back pain, unspecified: Secondary | ICD-10-CM

## 2022-07-25 DIAGNOSIS — E1142 Type 2 diabetes mellitus with diabetic polyneuropathy: Secondary | ICD-10-CM

## 2022-07-25 DIAGNOSIS — G8929 Other chronic pain: Secondary | ICD-10-CM

## 2022-07-25 DIAGNOSIS — R5381 Other malaise: Secondary | ICD-10-CM

## 2022-07-26 ENCOUNTER — Encounter: Payer: Self-pay | Admitting: Internal Medicine

## 2022-07-26 DIAGNOSIS — M545 Low back pain, unspecified: Secondary | ICD-10-CM | POA: Insufficient documentation

## 2022-07-27 NOTE — Progress Notes (Unsigned)
Subjective:    Patient ID: [name] [name], female    DOB: 11/05/1945, 76 y.o.   MRN: [id number]      HPI Crystle is here for No chief complaint on file.    Leg bruising - ? Infection - on augmentin since 12/15    Medications and allergies reviewed with patient and updated if appropriate.  Current Outpatient Medications on File Prior to Visit  Medication Sig Dispense Refill   Accu-Chek Softclix Lancets lancets Use as instructed to check sugars 2-3 times daily.  E11.42 100 each 12   acetaminophen (TYLENOL) 325 MG tablet Take 2 tablets (650 mg total) by mouth every 6 (six) hours as needed for mild pain or headache.     Alcohol Swabs PADS UAD to check sugars.  E11.65 100 each 12   allopurinol (ZYLOPRIM) 100 MG tablet TAKE 1 TABLET(100 MG) BY MOUTH TWICE DAILY 180 tablet 3   ALPRAZolam (XANAX) 0.25 MG tablet Take 1 tablet (0.25 mg total) by mouth 3 (three) times daily as needed for anxiety. 20 tablet 0   amoxicillin-clavulanate (AUGMENTIN) 875-125 MG tablet Take 1 tablet by mouth 2 (two) times daily for 10 days. 20 tablet 0   ascorbic acid (VITAMIN C) 500 MG tablet Take 500 mg by mouth at bedtime.     augmented betamethasone dipropionate (DIPROLENE-AF) 0.05 % cream      azelastine (ASTELIN) 0.1 % nasal spray INSTILL 2 SPRAYS IN EACH NOSTRIL TWICE DAILY 30 mL 12   Blood Glucose Monitoring Suppl (ACCU-CHEK GUIDE) w/Device KIT AS DIRECTED 1 kit 0   Budesonide ER 9 MG CP24 Take 9 mg by mouth daily in the afternoon. 90 capsule 1   carvedilol (COREG) 6.25 MG tablet TAKE 1 TABLET(6.25 MG) BY MOUTH TWICE DAILY WITH A MEAL 60 tablet 3   Certolizumab Pegol (CIMZIA [place]) Inject 400 mg into the skin every 30 (thirty) days. 200 mg on each side of stomach     Cholecalciferol (VITAMIN D3) 1000 UNITS CAPS Take 1,000 Units by mouth 2 (two) times daily.      ciclopirox (LOPROX) 0.77 % cream Apply topically.     clobetasol cream (TEMOVATE) 4.09 % Apply 1 application. topically 2 (two) times daily.      Cyanocobalamin (VITAMIN B-12) 1000 MCG SUBL Place 1,000 mcg under the tongue daily.     cyclobenzaprine (FLEXERIL) 10 MG tablet TAKE 1 TABLET BY MOUTH AT BEDTIME 90 tablet 1   docusate sodium (COLACE) 100 MG capsule TAKE 1 CAPSULE(100 MG) BY MOUTH TWICE DAILY 60 capsule 2   folic acid (FOLVITE) 1 MG tablet TAKE 1 TABLET(1 MG) BY MOUTH DAILY 90 tablet 3   glucose blood (ACCU-CHEK GUIDE) test strip USE AS INSTRUCTED TO CHECK SUGARS 2-3 TIMES DAILY 100 strip 12   glucose blood (ONETOUCH VERIO) test strip one strip (1 each dose) daily.     hydrochlorothiazide (MICROZIDE) 12.5 MG capsule Take 1 capsule (12.5 mg total) by mouth as needed (for swelling). 30 capsule 3   HYDROcodone-acetaminophen (NORCO) 10-325 MG tablet Take 1 tablet by mouth 3 (three) times daily as needed.     insulin detemir (LEVEMIR FLEXPEN) 100 UNIT/ML FlexPen INJECT 25 UNITS UNDER THE SKIN TWICE DAILY 15 mL 16   insulin glargine (LANTUS SOLOSTAR) 100 UNIT/ML Solostar Pen Inject 20 Units into the skin 2 (two) times daily. 15 mL 11   Insulin Pen Needle (B-D UF III MINI PEN NEEDLES) 31G X 5 MM MISC Use daily for insulin pen 90 each 3  lisinopril (ZESTRIL) 20 MG tablet Take 1 tablet (20 mg total) by mouth daily. 90 tablet 3   loperamide (IMODIUM A-D) 2 MG tablet Take 1 tablet (2 mg total) by mouth 4 (four) times daily as needed for diarrhea or loose stools. 360 tablet 3   Melatonin 5 MG SUBL Place 5 mg under the tongue at bedtime.     meloxicam (MOBIC) 7.5 MG tablet Take 7.5 mg by mouth daily.     metFORMIN (GLUCOPHAGE-XR) 500 MG 24 hr tablet TAKE 1 TABLET BY MOUTH TWICE A DAY 180 tablet 2   methotrexate (RHEUMATREX) 2.5 MG tablet Take 20 mg by mouth every Friday.      Methotrexate 2.5 MG/ML SOLN TAKE 8 TABLETSALL AT THE SAME TIME Orally ONCE WEEKLY for 90 days     naftifine (NAFTIN) 1 % cream Apply 1 application. topically daily as needed (rash).     omeprazole (PRILOSEC) 40 MG capsule TAKE 1 CAPSULE BY MOUTH EVERY DAY 90 capsule 1    potassium chloride (KLOR-CON M) 10 MEQ tablet TAKE 3 TABLETS(30 MEQ) BY MOUTH DAILY 270 tablet 3   Probiotic Product (ALIGN PO) Take 1 capsule by mouth daily.     rosuvastatin (CRESTOR) 5 MG tablet TAKE 1 TABLET(5 MG) BY MOUTH DAILY 90 tablet 3   Semaglutide (RYBELSUS) 3 MG TABS Take by mouth.     sertraline (ZOLOFT) 100 MG tablet TAKE 1 TABLET(100 MG) BY MOUTH DAILY 90 tablet 1   No current facility-administered medications on file prior to visit.    Review of Systems     Objective:  There were no vitals filed for this visit. BP Readings from Last 3 Encounters:  07/15/22 132/68  05/18/22 134/68  03/12/22 122/78   Wt Readings from Last 3 Encounters:  05/18/22 207 lb (93.9 kg)  03/12/22 200 lb 4 oz (90.8 kg)  02/05/22 200 lb (90.7 kg)   There is no height or weight on file to calculate BMI.    Physical Exam         Assessment & Plan:    See Problem List for Assessment and Plan of chronic medical problems.

## 2022-07-28 ENCOUNTER — Encounter: Payer: Self-pay | Admitting: Internal Medicine

## 2022-07-28 ENCOUNTER — Ambulatory Visit (INDEPENDENT_AMBULATORY_CARE_PROVIDER_SITE_OTHER): Payer: Medicare Other | Admitting: Internal Medicine

## 2022-07-28 VITALS — BP 130/74 | HR 88 | Temp 98.5°F | Ht 65.0 in

## 2022-07-28 DIAGNOSIS — T148XXA Other injury of unspecified body region, initial encounter: Secondary | ICD-10-CM

## 2022-07-28 DIAGNOSIS — I10 Essential (primary) hypertension: Secondary | ICD-10-CM

## 2022-07-28 NOTE — Patient Instructions (Addendum)
       Medications changes include :  complete Augmentin     Monitor closely and let me know if there are concerning changes.      Return if symptoms worsen or fail to improve.

## 2022-07-28 NOTE — Assessment & Plan Note (Signed)
Chronic Blood pressure is well-controlled Continue carvedilol 6.25 mg twice daily, lisinopril 20 mg daily

## 2022-07-28 NOTE — Assessment & Plan Note (Signed)
Subacute Just above left knee-hematoma/blood blister-some of the blood has drained from the blister area, but I know underneath there is likely a hematoma in the soft tissue which is likely causing most of her pain There is some slight redness around the area but this has decreased in size and does not appear to be an infection The area is tender as expected No evidence of worsening infection Complete Augmentin as prescribed Expect pain to slowly improve, but may take a few weeks She will call if her symptoms do not continue to improve or seem to be worsening in any way

## 2022-07-30 ENCOUNTER — Other Ambulatory Visit: Payer: Self-pay

## 2022-07-30 DIAGNOSIS — E1142 Type 2 diabetes mellitus with diabetic polyneuropathy: Secondary | ICD-10-CM

## 2022-07-30 MED ORDER — TIRZEPATIDE 2.5 MG/0.5ML ~~LOC~~ SOAJ: 2.5000 mg | SUBCUTANEOUS | 0 refills | Status: DC

## 2022-07-30 MED ORDER — BUDESONIDE ER 9 MG PO CP24: 9.0000 mg | ORAL_CAPSULE | Freq: Every day | ORAL | 0 refills | Status: DC

## 2022-08-11 ENCOUNTER — Other Ambulatory Visit: Payer: Self-pay | Admitting: Internal Medicine

## 2022-08-12 DIAGNOSIS — M6281 Muscle weakness (generalized): Secondary | ICD-10-CM | POA: Diagnosis not present

## 2022-08-12 DIAGNOSIS — M545 Low back pain, unspecified: Secondary | ICD-10-CM | POA: Diagnosis not present

## 2022-08-12 DIAGNOSIS — R2681 Unsteadiness on feet: Secondary | ICD-10-CM | POA: Diagnosis not present

## 2022-08-12 DIAGNOSIS — R5381 Other malaise: Secondary | ICD-10-CM | POA: Diagnosis not present

## 2022-08-12 DIAGNOSIS — G8929 Other chronic pain: Secondary | ICD-10-CM | POA: Diagnosis not present

## 2022-08-13 ENCOUNTER — Telehealth: Payer: Self-pay | Admitting: Internal Medicine

## 2022-08-13 ENCOUNTER — Other Ambulatory Visit: Payer: Self-pay | Admitting: Internal Medicine

## 2022-08-13 MED ORDER — ALPRAZOLAM 0.25 MG PO TABS: 0.2500 mg | ORAL_TABLET | Freq: Three times a day (TID) | ORAL | 0 refills | Status: DC | PRN

## 2022-08-13 NOTE — Telephone Encounter (Signed)
Message left for patient that refill had been sent.  If she needs any other refills she has been told to call back and let us know so [facility]

## 2022-08-13 NOTE — Telephone Encounter (Signed)
Alprazolam sent

## 2022-08-13 NOTE — Telephone Encounter (Signed)
Patient called and stated she needs a refill on her alprazolam (xanax) 0.'25mg'$ , [facility] [address] to [place] about the [facility]ill 3 times and has not gotten word back. She also said she needs all of [facility] [address]. She would like a callback at [phone].

## 2022-08-19 DIAGNOSIS — Z79899 Other long term (current) drug therapy: Secondary | ICD-10-CM | POA: Diagnosis not present

## 2022-08-19 DIAGNOSIS — L4059 Other psoriatic arthropathy: Secondary | ICD-10-CM | POA: Diagnosis not present

## 2022-09-01 ENCOUNTER — Other Ambulatory Visit: Payer: Self-pay

## 2022-09-01 ENCOUNTER — Ambulatory Visit (INDEPENDENT_AMBULATORY_CARE_PROVIDER_SITE_OTHER): Payer: Medicare Other | Admitting: Podiatry

## 2022-09-01 ENCOUNTER — Other Ambulatory Visit: Payer: Self-pay | Admitting: Internal Medicine

## 2022-09-01 ENCOUNTER — Telehealth: Payer: Self-pay

## 2022-09-01 VITALS — BP 115/47

## 2022-09-01 DIAGNOSIS — E1142 Type 2 diabetes mellitus with diabetic polyneuropathy: Secondary | ICD-10-CM

## 2022-09-01 DIAGNOSIS — B351 Tinea unguium: Secondary | ICD-10-CM

## 2022-09-01 DIAGNOSIS — I739 Peripheral vascular disease, unspecified: Secondary | ICD-10-CM | POA: Diagnosis not present

## 2022-09-01 DIAGNOSIS — M79675 Pain in left toe(s): Secondary | ICD-10-CM | POA: Diagnosis not present

## 2022-09-01 DIAGNOSIS — M79674 Pain in right toe(s): Secondary | ICD-10-CM | POA: Diagnosis not present

## 2022-09-01 NOTE — Progress Notes (Signed)
  Subjective:  Patient ID: [name] [name], female    DOB: 06/30/1946,  MRN: [id number]  [name] [name] presents to clinic today for at risk foot care with history of diabetic neuropathy and Charcot.  She presents for painful elongated mycotic toenails 1-5 bilaterally which are tender when wearing enclosed shoe gear. Pain is relieved with periodic professional debridement. Patient is also managed by Dr. Erline Hau at [facility] in [place]. Last visit with Dr. Prudy Feeler was 04/28/2022. Chief Complaint  Patient presents with   Nail Problem    DFC BS-130 A1C-5.6 PCP-Stacy Burns PCP VST-08/2022   New problem(s): None.   PCP is Burns, Claudina Lick, MD.  Allergies  Allergen Reactions   Other Shortness Of Breath    Blue fish: palms and feet turn red   Thorazine [Chlorpromazine] Other (See Comments)    Made her more more off balanced, vision   Bee Venom Swelling   Farxiga [Dapagliflozin]     Recurrent yeast infections   Semaglutide     Rybelsus - lip swelling, GI upset   Sulfa Antibiotics Hives    Review of Systems: Negative except as noted in the HPI.  Objective: No changes noted in today's physical examination. Vitals:   09/01/22 1558  BP: (!) 115/47   [name] [name] is a pleasant 77 y.o. female in NAD. AAO x 3.  Vascular Examination: CFT <3 seconds b/l. DP/PT pulses faintly palpable b/l. Skin temperature gradient warm to cool b/l. No pain with calf compression. No ischemia or gangrene. No cyanosis or clubbing noted b/l. Pedal hair absent.   Neurological Examination: Sensation grossly intact b/l with 10 gram monofilament. Vibratory sensation intact b/l. Pt has subjective symptoms of neuropathy. Protective sensation diminished with 10g monofilament b/l. Vibratory sensation decreased b/l.  Dermatological Examination: Pedal skin warm and supple b/l.   No open wounds. No interdigital macerations.  Toenails 1-5 b/l thick, discolored, elongated with subungual  debris and pain on dorsal palpation.    Raised soft tissue mass dorsal aspect right great toe IPJ. Fixed, flesh colored. Pedal skin is warm and supple b/l LE. No open wounds b/l LE. No interdigital macerations noted b/l LE. Toenails 1-5 b/l elongated, discolored, dystrophic, thickened, crumbly with subungual debris and tenderness to dorsal palpation.  Musculoskeletal Examination: Muscle strength 5/5 to b/l LE. Charcot deformity noted plantar midfoot both feet. Dorsal exostosis right midfoot.  Radiographs: None  Last A1c:      Latest Ref Rng & Units 03/12/2022   10:26 AM 09/16/2021    9:51 AM  Hemoglobin A1C  Hemoglobin-A1c 4.6 - 6.5 % 6.6  6.6    Assessment/Plan: 1. Pain due to onychomycosis of toenails of both feet   2. PAD (peripheral artery disease) ([place])   3. Diabetic peripheral neuropathy ([place])     -Patient was evaluated and treated. All patient's and/or POA's questions/concerns answered on today's visit. -Patient seen for nail care. She will see Dr. Prudy Feeler for any acute pedal issues. -Continue foot and shoe inspections daily. Monitor blood glucose per PCP/Endocrinologist's recommendations. -Continue diabetic shoes daily. -Toenails 1-5 b/l were debrided in length and girth with sterile nail nippers and dremel without iatrogenic bleeding.  -Patient/POA to call should there be question/concern in the interim.   Return in about 3 months (around 12/01/2022).  Marzetta Board, DPM

## 2022-09-01 NOTE — Telephone Encounter (Signed)
[email or web address] KEY: [id number]

## 2022-09-05 ENCOUNTER — Encounter: Payer: Self-pay | Admitting: Podiatry

## 2022-09-05 ENCOUNTER — Encounter: Payer: Self-pay | Admitting: Internal Medicine

## 2022-09-06 MED ORDER — ONDANSETRON HCL 4 MG PO TABS: 4.0000 mg | ORAL_TABLET | Freq: Three times a day (TID) | ORAL | 0 refills | Status: DC | PRN

## 2022-09-07 ENCOUNTER — Other Ambulatory Visit (HOSPITAL_COMMUNITY): Payer: Self-pay

## 2022-09-07 ENCOUNTER — Other Ambulatory Visit: Payer: Self-pay | Admitting: Internal Medicine

## 2022-09-07 NOTE — Telephone Encounter (Signed)
/  Patient Advocate Encounter  Prior authorization is required for [facility] 2.'5MG'$ /0.5ML pen-injectors. PA submitted and APPROVED on 09/07/22.  Key [id number]  Effective: 08/08/22 - 08/09/2098  Pharmacy received paid claim, ordering for patient

## 2022-09-14 DIAGNOSIS — M47816 Spondylosis without myelopathy or radiculopathy, lumbar region: Secondary | ICD-10-CM | POA: Diagnosis not present

## 2022-09-16 ENCOUNTER — Encounter: Payer: Self-pay | Admitting: Internal Medicine

## 2022-09-16 ENCOUNTER — Other Ambulatory Visit: Payer: Self-pay | Admitting: Internal Medicine

## 2022-09-16 DIAGNOSIS — R2681 Unsteadiness on feet: Secondary | ICD-10-CM | POA: Diagnosis not present

## 2022-09-16 DIAGNOSIS — G8929 Other chronic pain: Secondary | ICD-10-CM | POA: Diagnosis not present

## 2022-09-16 DIAGNOSIS — K219 Gastro-esophageal reflux disease without esophagitis: Secondary | ICD-10-CM | POA: Insufficient documentation

## 2022-09-16 DIAGNOSIS — R5381 Other malaise: Secondary | ICD-10-CM | POA: Diagnosis not present

## 2022-09-16 DIAGNOSIS — M6281 Muscle weakness (generalized): Secondary | ICD-10-CM | POA: Diagnosis not present

## 2022-09-16 DIAGNOSIS — M545 Low back pain, unspecified: Secondary | ICD-10-CM | POA: Diagnosis not present

## 2022-09-16 NOTE — Progress Notes (Unsigned)
Subjective:    Patient ID: [name] [name], female    DOB: [birth year], 77 y.o.   MRN: [id number]     HPI [name] is here for follow up of her chronic medical problems, including DM w/ neuropathy, Htn, hld, depression, anxiety, gout, long covid ( dec app, taste, hiccups)  Did not take mounjaro  Medications and allergies reviewed with patient and updated if appropriate.  Current Outpatient Medications on File Prior to Visit  Medication Sig Dispense Refill   Accu-Chek Softclix Lancets lancets Use as instructed to check sugars 2-3 times daily.  E11.42 100 each 12   acetaminophen (TYLENOL) 325 MG tablet Take 2 tablets (650 mg total) by mouth every 6 (six) hours as needed for mild pain or headache.     Alcohol Swabs PADS UAD to check sugars.  E11.65 100 each 12   allopurinol (ZYLOPRIM) 100 MG tablet TAKE 1 TABLET(100 MG) BY MOUTH TWICE DAILY 180 tablet 3   ALPRAZolam (XANAX) 0.25 MG tablet TAKE 1 TABLET BY MOUTH 3 TIMES DAILY AS NEEDED FOR ANXIETY. 20 tablet 0   ascorbic acid (VITAMIN C) 500 MG tablet Take 500 mg by mouth at bedtime.     augmented betamethasone dipropionate (DIPROLENE-AF) 0.05 % cream      azelastine (ASTELIN) 0.1 % nasal spray INSTILL 2 SPRAYS IN EACH NOSTRIL TWICE DAILY 30 mL 12   Blood Glucose Monitoring Suppl (ACCU-CHEK GUIDE) w/Device KIT AS DIRECTED 1 kit 0   Budesonide ER 9 MG TB24 TAKE 1 TABLET BY MOUTH EVERY DAY IN THE AFTERNOON 90 tablet 1   carvedilol (COREG) 6.25 MG tablet TAKE 1 TABLET(6.25 MG) BY MOUTH TWICE DAILY WITH A MEAL 60 tablet 3   Certolizumab Pegol (CIMZIA [place]) Inject 400 mg into the skin every 30 (thirty) days. 200 mg on each side of stomach     Cholecalciferol (VITAMIN D3) 1000 UNITS CAPS Take 1,000 Units by mouth 2 (two) times daily.      ciclopirox (LOPROX) 0.77 % cream Apply topically.     clobetasol cream (TEMOVATE) 0.25 % Apply 1 application. topically 2 (two) times daily.     Cyanocobalamin (VITAMIN B-12) 1000 MCG SUBL Place 1,000 mcg  under the tongue daily.     cyclobenzaprine (FLEXERIL) 10 MG tablet TAKE 1 TABLET BY MOUTH AT BEDTIME 90 tablet 1   docusate sodium (COLACE) 100 MG capsule TAKE 1 CAPSULE(100 MG) BY MOUTH TWICE DAILY 60 capsule 2   folic acid (FOLVITE) 1 MG tablet TAKE 1 TABLET(1 MG) BY MOUTH DAILY 90 tablet 3   glucose blood (ACCU-CHEK GUIDE) test strip USE AS INSTRUCTED TO CHECK SUGARS 2-3 TIMES DAILY 100 strip 12   glucose blood (ONETOUCH VERIO) test strip one strip (1 each dose) daily.     hydrochlorothiazide (MICROZIDE) 12.5 MG capsule Take 1 capsule (12.5 mg total) by mouth as needed (for swelling). 30 capsule 3   HYDROcodone-acetaminophen (NORCO) 10-325 MG tablet Take 1 tablet by mouth 3 (three) times daily as needed.     insulin detemir (LEVEMIR FLEXPEN) 100 UNIT/ML FlexPen INJECT 25 UNITS UNDER THE SKIN TWICE DAILY 15 mL 16   insulin glargine (LANTUS SOLOSTAR) 100 UNIT/ML Solostar Pen Inject 20 Units into the skin 2 (two) times daily. 15 mL 11   Insulin Pen Needle (B-D UF III MINI PEN NEEDLES) 31G X 5 MM MISC Use daily for insulin pen 90 each 3   lisinopril (ZESTRIL) 20 MG tablet Take 1 tablet (20 mg total) by mouth daily.  90 tablet 3   loperamide (IMODIUM A-D) 2 MG tablet Take 1 tablet (2 mg total) by mouth 4 (four) times daily as needed for diarrhea or loose stools. 360 tablet 3   Melatonin 5 MG SUBL Place 5 mg under the tongue at bedtime.     meloxicam (MOBIC) 7.5 MG tablet Take 7.5 mg by mouth daily.     metFORMIN (GLUCOPHAGE-XR) 500 MG 24 hr tablet TAKE 1 TABLET BY MOUTH TWICE A DAY 180 tablet 2   methotrexate (RHEUMATREX) 2.5 MG tablet Take 20 mg by mouth every Friday.      Methotrexate 2.5 MG/ML SOLN TAKE 8 TABLETSALL AT THE SAME TIME Orally ONCE WEEKLY for 90 days     naftifine (NAFTIN) 1 % cream Apply 1 application. topically daily as needed (rash).     omeprazole (PRILOSEC) 40 MG capsule TAKE 1 CAPSULE BY MOUTH EVERY DAY 90 capsule 1   ondansetron (ZOFRAN) 4 MG tablet Take 1 tablet (4 mg total)  by mouth every 8 (eight) hours as needed for nausea or vomiting. 20 tablet 0   potassium chloride (KLOR-CON M) 10 MEQ tablet TAKE 3 TABLETS(30 MEQ) BY MOUTH DAILY 270 tablet 3   potassium chloride (KLOR-CON) 10 MEQ tablet Take 30 mEq by mouth daily.     Probiotic Product (ALIGN PO) Take 1 capsule by mouth daily.     rosuvastatin (CRESTOR) 5 MG tablet TAKE 1 TABLET(5 MG) BY MOUTH DAILY 90 tablet 3   Semaglutide (RYBELSUS) 3 MG TABS Take by mouth.     sertraline (ZOLOFT) 100 MG tablet TAKE 1 TABLET BY MOUTH EVERY DAY 90 tablet 1   tirzepatide (MOUNJARO) 2.5 MG/0.5ML Pen INJECT 2.5 MG SUBCUTANEOUSLY WEEKLY 2 mL 0   No current facility-administered medications on file prior to visit.     Review of Systems     Objective:  There were no vitals filed for this visit. BP Readings from Last 3 Encounters:  09/01/22 (!) 115/47  07/28/22 130/74  07/15/22 132/68   Wt Readings from Last 3 Encounters:  05/18/22 207 lb (93.9 kg)  03/12/22 200 lb 4 oz (90.8 kg)  02/05/22 200 lb (90.7 kg)   There is no height or weight on file to calculate BMI.    Physical Exam     Lab Results  Component Value Date   WBC 12.9 (H) 03/12/2022   HGB 11.1 (L) 03/12/2022   HCT 35.1 (L) 03/12/2022   PLT 285.0 03/12/2022   GLUCOSE 87 03/12/2022   CHOL 134 03/12/2022   TRIG 121.0 03/12/2022   HDL 49.60 03/12/2022   LDLDIRECT 139.0 08/15/2019   LDLCALC 61 03/12/2022   ALT 16 03/12/2022   AST 20 03/12/2022   NA 135 03/12/2022   K 4.5 03/12/2022   CL 100 03/12/2022   CREATININE 0.85 03/12/2022   BUN 20 03/12/2022   CO2 29 03/12/2022   TSH 3.249 07/16/2020   INR 1.14 09/13/2012   HGBA1C 6.6 (H) 03/12/2022     Assessment & Plan:    See Problem List for Assessment and Plan of chronic medical problems.

## 2022-09-16 NOTE — Patient Instructions (Addendum)
      Blood work was ordered.   The lab is on the first floor.    Medications changes include :       A referral was ordered for XXX.     Someone will call you to schedule an appointment.    Return in about 6 months (around 03/18/2023) for follow up.

## 2022-09-17 ENCOUNTER — Ambulatory Visit: Payer: Medicare Other

## 2022-09-17 ENCOUNTER — Ambulatory Visit (INDEPENDENT_AMBULATORY_CARE_PROVIDER_SITE_OTHER): Payer: Medicare Other | Admitting: Internal Medicine

## 2022-09-17 ENCOUNTER — Encounter: Payer: Self-pay | Admitting: Internal Medicine

## 2022-09-17 VITALS — BP 126/80 | HR 63 | Temp 98.2°F | Ht 65.0 in | Wt 200.0 lb

## 2022-09-17 DIAGNOSIS — L4050 Arthropathic psoriasis, unspecified: Secondary | ICD-10-CM | POA: Diagnosis not present

## 2022-09-17 DIAGNOSIS — E785 Hyperlipidemia, unspecified: Secondary | ICD-10-CM | POA: Diagnosis not present

## 2022-09-17 DIAGNOSIS — F419 Anxiety disorder, unspecified: Secondary | ICD-10-CM | POA: Diagnosis not present

## 2022-09-17 DIAGNOSIS — E1142 Type 2 diabetes mellitus with diabetic polyneuropathy: Secondary | ICD-10-CM | POA: Diagnosis not present

## 2022-09-17 DIAGNOSIS — I10 Essential (primary) hypertension: Secondary | ICD-10-CM | POA: Diagnosis not present

## 2022-09-17 DIAGNOSIS — F3289 Other specified depressive episodes: Secondary | ICD-10-CM | POA: Diagnosis not present

## 2022-09-17 DIAGNOSIS — Z79899 Other long term (current) drug therapy: Secondary | ICD-10-CM | POA: Diagnosis not present

## 2022-09-17 DIAGNOSIS — K219 Gastro-esophageal reflux disease without esophagitis: Secondary | ICD-10-CM | POA: Diagnosis not present

## 2022-09-17 DIAGNOSIS — L4059 Other psoriatic arthropathy: Secondary | ICD-10-CM | POA: Diagnosis not present

## 2022-09-17 DIAGNOSIS — Z8711 Personal history of peptic ulcer disease: Secondary | ICD-10-CM | POA: Insufficient documentation

## 2022-09-17 DIAGNOSIS — M109 Gout, unspecified: Secondary | ICD-10-CM | POA: Diagnosis not present

## 2022-09-17 DIAGNOSIS — Z111 Encounter for screening for respiratory tuberculosis: Secondary | ICD-10-CM | POA: Diagnosis not present

## 2022-09-17 DIAGNOSIS — R5383 Other fatigue: Secondary | ICD-10-CM | POA: Diagnosis not present

## 2022-09-17 LAB — COMPREHENSIVE METABOLIC PANEL
ALT: 15 U/L (ref 0–35)
AST: 17 U/L (ref 0–37)
Albumin: 3.7 g/dL (ref 3.5–5.2)
Alkaline Phosphatase: 64 U/L (ref 39–117)
BUN: 25 mg/dL — ABNORMAL HIGH (ref 6–23)
CO2: 28 mEq/L (ref 19–32)
Calcium: 9.9 mg/dL (ref 8.4–10.5)
Chloride: 98 mEq/L (ref 96–112)
Creatinine, Ser: 0.93 mg/dL (ref 0.40–1.20)
GFR: 59.44 mL/min — ABNORMAL LOW (ref >60.00)
Glucose, Bld: 102 mg/dL — ABNORMAL HIGH (ref 70–99)
Potassium: 5.5 mEq/L — ABNORMAL HIGH (ref 3.5–5.1)
Sodium: 134 mEq/L — ABNORMAL LOW (ref 135–145)
Total Bilirubin: 0.3 mg/dL (ref 0.2–1.2)
Total Protein: 6.4 g/dL (ref 6.0–8.3)

## 2022-09-17 LAB — CBC WITH DIFFERENTIAL/PLATELET
Basophils Absolute: 0 10*3/uL (ref 0.0–0.1)
Basophils Relative: 0.4 % (ref 0.0–3.0)
Eosinophils Absolute: 0.2 10*3/uL (ref 0.0–0.7)
Eosinophils Relative: 1.7 % (ref 0.0–5.0)
HCT: 32.3 % — ABNORMAL LOW (ref 36.0–46.0)
Hemoglobin: 10.7 g/dL — ABNORMAL LOW (ref 12.0–15.0)
Lymphocytes Relative: 18.4 % (ref 12.0–46.0)
Lymphs Abs: 2 10*3/uL (ref 0.7–4.0)
MCHC: 33.1 g/dL (ref 30.0–36.0)
MCV: 89.2 fl (ref 78.0–100.0)
Monocytes Absolute: 0.3 10*3/uL (ref 0.1–1.0)
Monocytes Relative: 2.5 % — ABNORMAL LOW (ref 3.0–12.0)
Neutro Abs: 8.2 10*3/uL — ABNORMAL HIGH (ref 1.4–7.7)
Neutrophils Relative %: 77 % (ref 43.0–77.0)
Platelets: 264 10*3/uL (ref 150.0–400.0)
RBC: 3.62 Mil/uL — ABNORMAL LOW (ref 3.87–5.11)
RDW: 19.6 % — ABNORMAL HIGH (ref 11.5–15.5)
WBC: 10.6 10*3/uL — ABNORMAL HIGH (ref 4.0–10.5)

## 2022-09-17 LAB — MICROALBUMIN / CREATININE URINE RATIO
Creatinine,U: 66.9 mg/dL
Microalb Creat Ratio: 1 mg/g (ref 0.0–30.0)
Microalb, Ur: <0.7 mg/dL (ref 0.0–1.9)

## 2022-09-17 LAB — LIPID PANEL
Cholesterol: 124 mg/dL (ref 0–200)
HDL: 47 mg/dL (ref >39.00)
LDL Cholesterol: 57 mg/dL (ref 0–99)
NonHDL: 76.94
Total CHOL/HDL Ratio: 3
Triglycerides: 102 mg/dL (ref 0.0–149.0)
VLDL: 20.4 mg/dL (ref 0.0–40.0)

## 2022-09-17 LAB — HEMOGLOBIN A1C: Hgb A1c MFr Bld: 6.3 % (ref 4.6–6.5)

## 2022-09-17 LAB — TSH: TSH: 2.32 u[IU]/mL (ref 0.35–5.50)

## 2022-09-17 MED ORDER — SERTRALINE HCL 100 MG PO TABS: 150.0000 mg | ORAL_TABLET | Freq: Every day | ORAL | 1 refills | Status: DC

## 2022-09-17 NOTE — Assessment & Plan Note (Addendum)
Chronic Controlled, Stable Last uric acid level 4.4 Continue allopurinol 100 mg twice daily

## 2022-09-17 NOTE — Assessment & Plan Note (Signed)
Chronic Regular exercise and healthy diet encouraged Check lipid panel  Continue Crestor 5 mg daily

## 2022-09-17 NOTE — Assessment & Plan Note (Signed)
Chronic GERD controlled Continue omeprazole 40 mg daily 

## 2022-09-17 NOTE — Assessment & Plan Note (Addendum)
Chronic Not ideally controlled Increase sertraline to 150 mg daily, continue alprazolam 0.25 mg 3 times daily as needed

## 2022-09-17 NOTE — Assessment & Plan Note (Signed)
Chronic   Lab Results  Component Value Date   HGBA1C 6.6 (H) 03/12/2022   Sugars controlled Testing sugars 1 times a day Check A1c, urine microalbumin today Continue metformin XR 500 mg twice daily, Lantus twice daily Stressed regular exercise, diabetic diet

## 2022-09-17 NOTE — Assessment & Plan Note (Signed)
Chronic Blood pressure well controlled CMP Continue carvedilol 6.25 mg twice daily, lisinopril 20 mg daily

## 2022-09-17 NOTE — Assessment & Plan Note (Addendum)
Chronic Not ideally controlled increase sertraline to 150 mg daily

## 2022-09-17 NOTE — Assessment & Plan Note (Signed)
Chronic Following with podiatry regularly

## 2022-09-17 NOTE — Assessment & Plan Note (Signed)
Chronic Management per rheumatology On methotrexate

## 2022-09-18 ENCOUNTER — Other Ambulatory Visit: Payer: Self-pay | Admitting: Internal Medicine

## 2022-09-18 MED ORDER — POTASSIUM CHLORIDE CRYS ER 10 MEQ PO TBCR: 20.0000 meq | EXTENDED_RELEASE_TABLET | Freq: Every day | ORAL | 3 refills | Status: DC

## 2022-09-21 ENCOUNTER — Other Ambulatory Visit: Payer: Self-pay

## 2022-09-21 ENCOUNTER — Encounter: Payer: Self-pay | Admitting: Internal Medicine

## 2022-09-21 MED ORDER — SERTRALINE HCL 100 MG PO TABS: 150.0000 mg | ORAL_TABLET | Freq: Every day | ORAL | 1 refills | Status: DC

## 2022-09-22 ENCOUNTER — Ambulatory Visit: Payer: Medicare Other | Attending: Cardiovascular Disease | Admitting: Cardiovascular Disease

## 2022-09-22 ENCOUNTER — Encounter: Payer: Self-pay | Admitting: Cardiovascular Disease

## 2022-09-22 VITALS — BP 138/74 | HR 72 | Ht 66.0 in | Wt 208.1 lb

## 2022-09-22 DIAGNOSIS — E6609 Other obesity due to excess calories: Secondary | ICD-10-CM

## 2022-09-22 DIAGNOSIS — I10 Essential (primary) hypertension: Secondary | ICD-10-CM

## 2022-09-22 DIAGNOSIS — E875 Hyperkalemia: Secondary | ICD-10-CM | POA: Diagnosis not present

## 2022-09-22 DIAGNOSIS — G4733 Obstructive sleep apnea (adult) (pediatric): Secondary | ICD-10-CM | POA: Diagnosis not present

## 2022-09-22 DIAGNOSIS — E118 Type 2 diabetes mellitus with unspecified complications: Secondary | ICD-10-CM

## 2022-09-22 DIAGNOSIS — E785 Hyperlipidemia, unspecified: Secondary | ICD-10-CM

## 2022-09-22 DIAGNOSIS — Z6833 Body mass index (BMI) 33.0-33.9, adult: Secondary | ICD-10-CM | POA: Diagnosis not present

## 2022-09-22 DIAGNOSIS — M25473 Effusion, unspecified ankle: Secondary | ICD-10-CM

## 2022-09-22 NOTE — Patient Instructions (Signed)
Medication Instructions:  Your physician recommends that you continue on your current medications as directed. Please refer to the Current Medication list given to you today.  *If you need a refill on your cardiac medications before your next appointment, please call your pharmacy*   Follow-Up: At [facility], you and your health needs are our priority.  As part of our continuing mission to provide you with exceptional heart care, we have created designated Provider Care Teams.  These Care Teams include your primary Cardiologist (physician) and Advanced Practice Providers (APPs -  Physician Assistants and Nurse Practitioners) who all work together to provide you with the care you need, when you need it.  We recommend signing up for the patient portal called "MyChart".  Sign up information is provided on this After Visit Summary.  MyChart is used to connect with patients for Virtual Visits (Telemedicine).  Patients are able to view lab/test results, encounter notes, upcoming appointments, etc.  Non-urgent messages can be sent to your provider as well.   To learn more about what you can do with MyChart, go to [email or web address].    Your next appointment:    Pending your download  Please bring your card tomorrow to [facility]

## 2022-09-24 ENCOUNTER — Other Ambulatory Visit: Payer: Self-pay

## 2022-09-24 IMAGING — RF DG ESOPHAGUS
8 series · 14 of 24 positions shown · non-contrast
Comparison: No comparison studies available.

CLINICAL DATA: Dysphagia.

EXAM:
ESOPHOGRAM / BARIUM SWALLOW / BARIUM TABLET STUDY
TECHNIQUE: Combined double contrast and single contrast examination performed
using effervescent crystals, thick barium liquid, and thin barium
liquid. The patient was observed with fluoroscopy swallowing a 13 mm
barium sulphate tablet.
FLUOROSCOPY TIME:  Fluoroscopy Time:  1 minutes and 12 seconds.
Radiation Exposure Index (if provided by the fluoroscopic device):
183 mGy
Number of Acquired Spot Images:

[Series 1: sequence · 0.28mm/px · 2 of 17 frames shown (1 of 7)]
[frame 3/17]
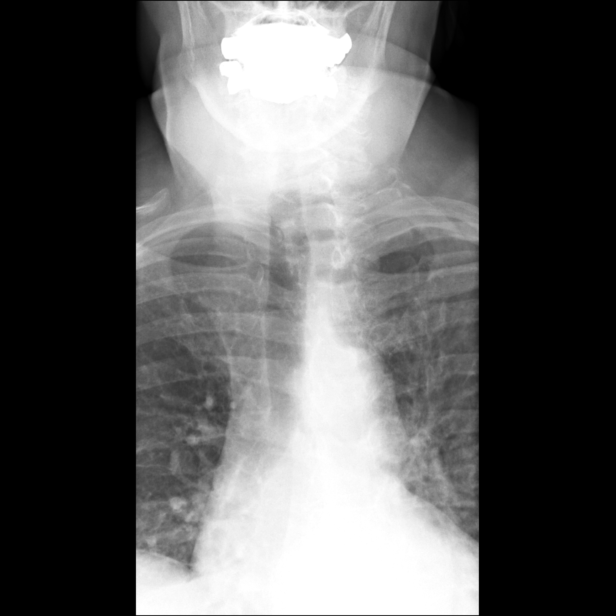
[frame 15/17]
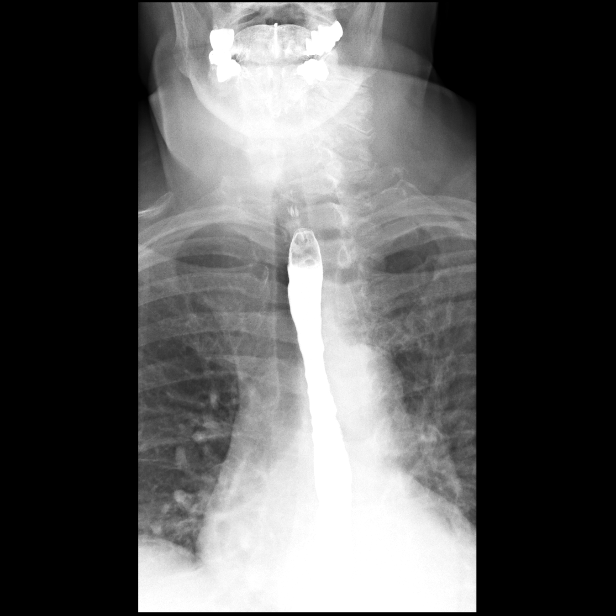

[Series 3: sequence · 0.28mm/px · 2 of 13 frames shown (2 of 7)]
[frame 6/13]
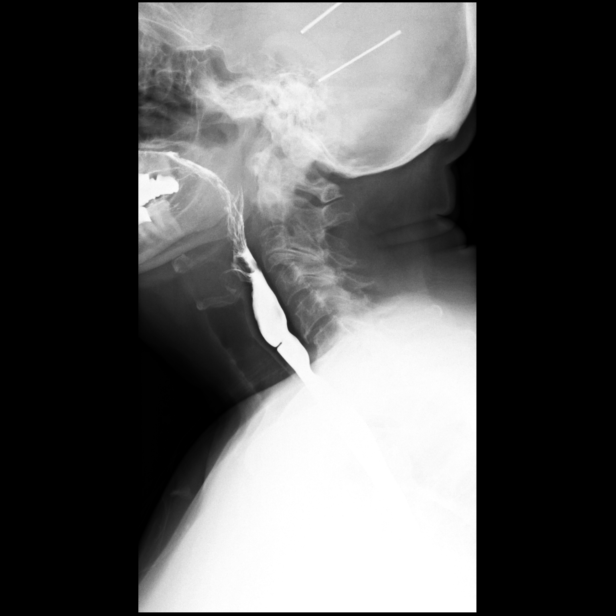
[frame 12/13]
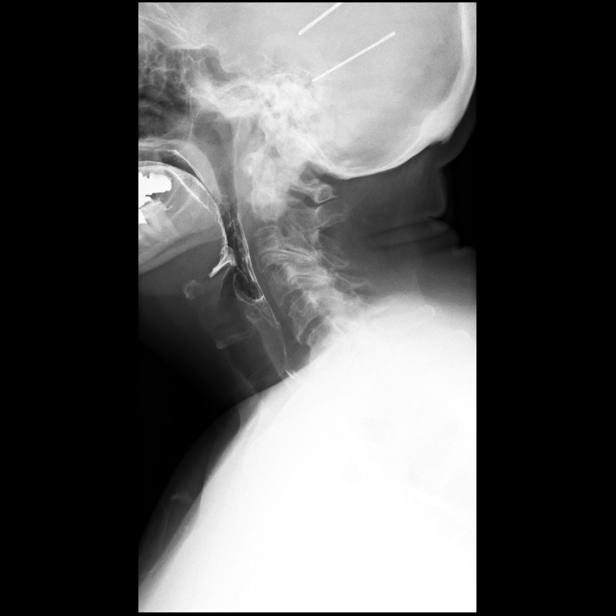

[Series 4: sequence · 0.28mm/px · 2 of 36 frames shown (3 of 7)]
[frame 6/36]
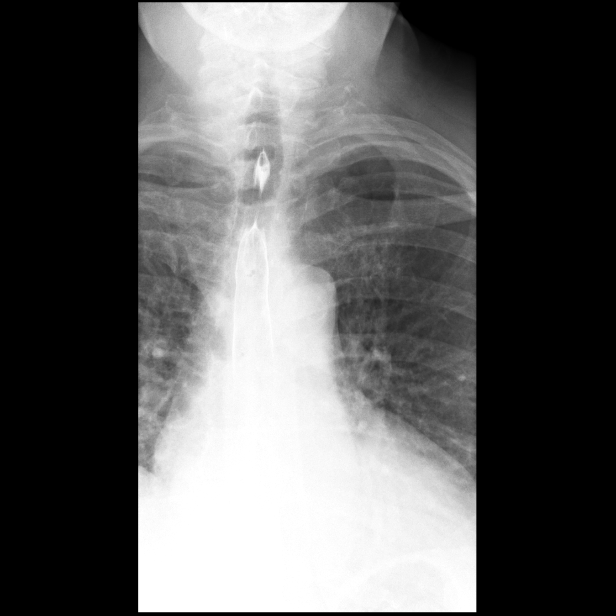
[frame 31/36]
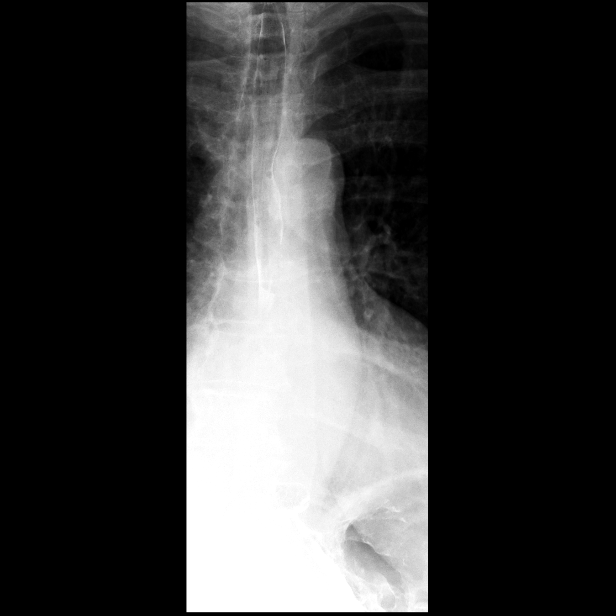

[Series 5: sequence · 0.28mm/px · 2 of 29 frames shown (4 of 7)]
[frame 15/29]
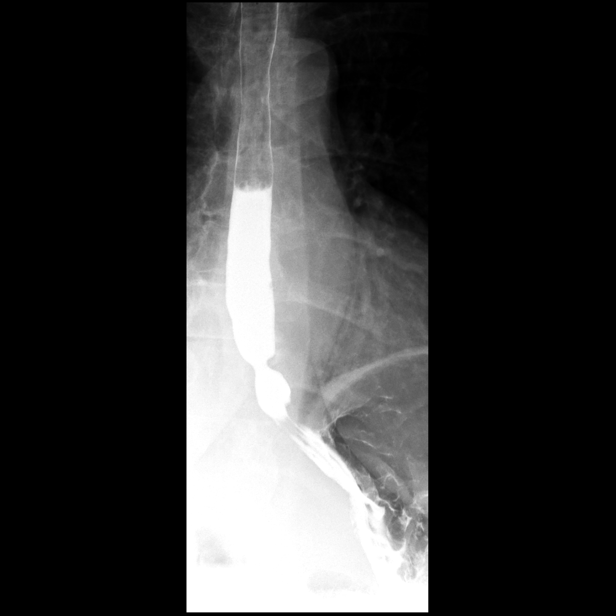
[frame 20/29]
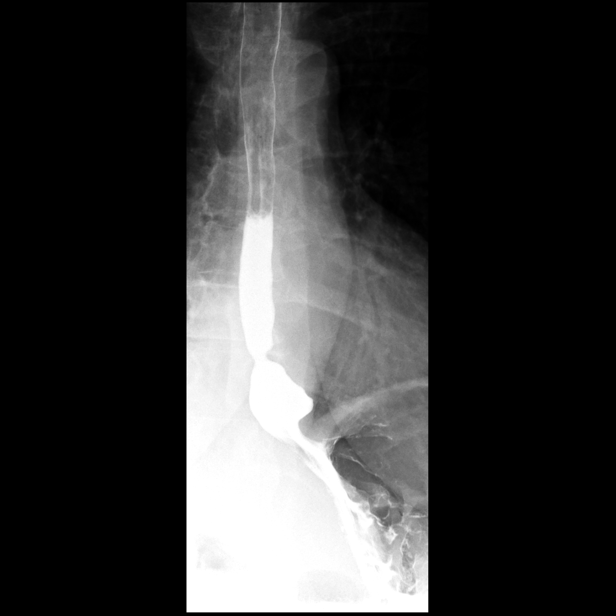

[Series 6: sequence · 2 of 30 frames shown (5 of 7)]
[frame 5/30]
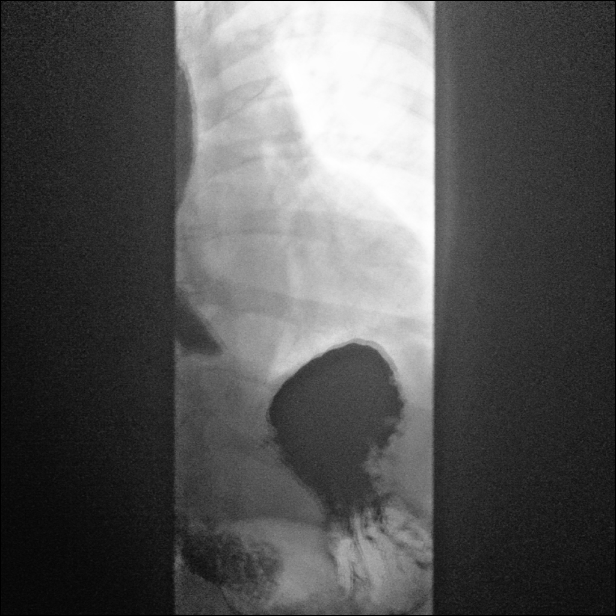
[frame 26/30]
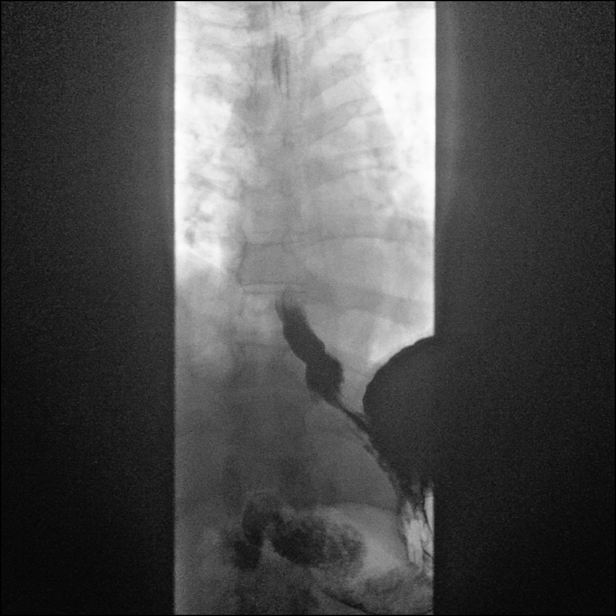

[Series 7: sequence · 2 of 21 frames shown (6 of 7)]
[frame 11/21]
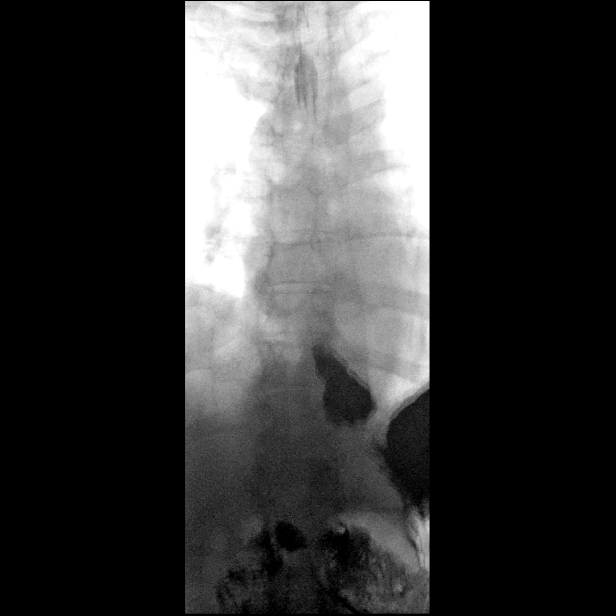
[frame 18/21]
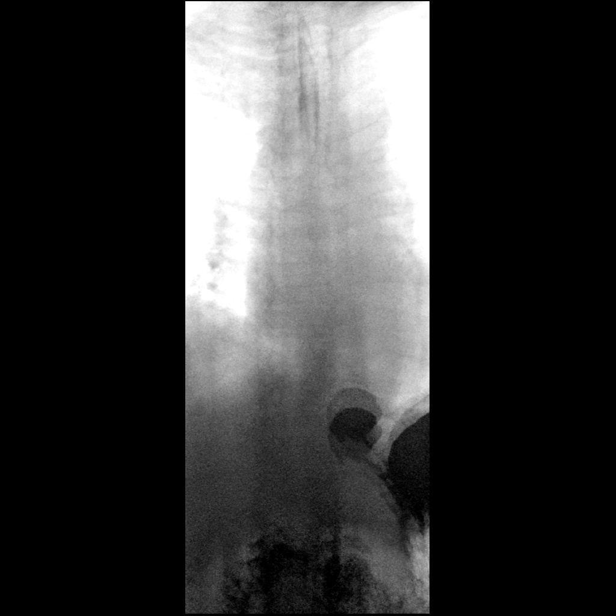

[Series 8: sequence · 1 of 35 frames shown (7 of 7)]
[frame 29/35]
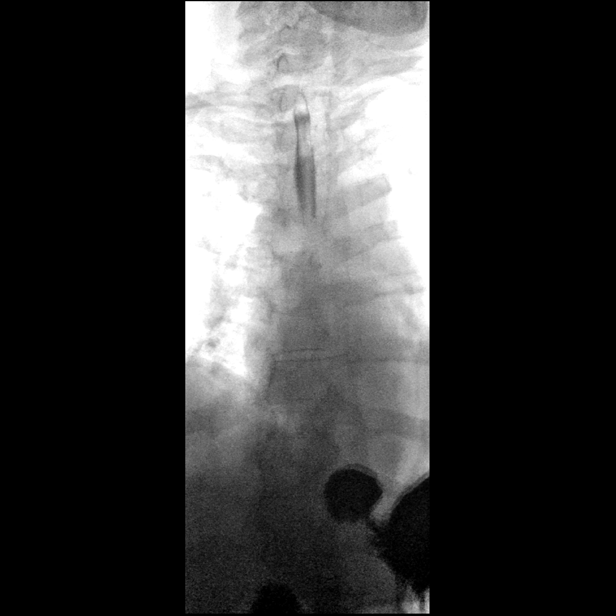

[Series 9: one shot · 1 of 1 slices shown]
[im 1/1]
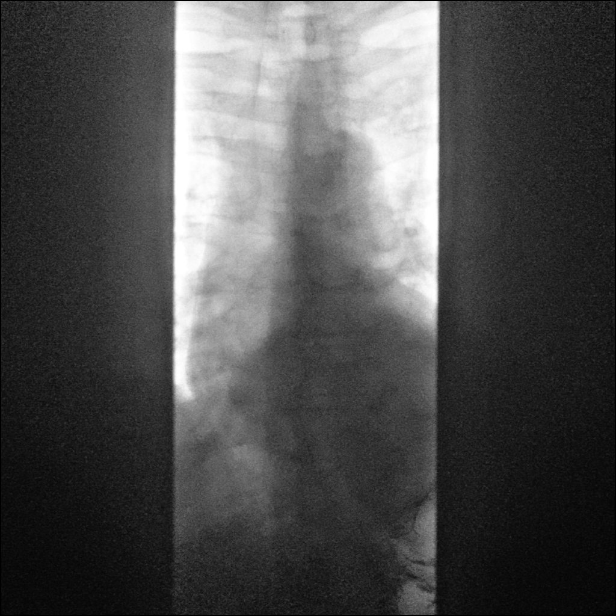

[14 of 24 positions shown; findings below may reference images not displayed]

FINDINGS: Frontal and lateral views of the hypopharynx while swallowing thick
barium demonstrate the presence of a proximal anterior esophageal
web.

Double contrast imaging of the esophagus shows no evidence for
stricture, mass lesion, diverticulum, or gross mucosal ulceration.

Assessment of esophageal motility shows preservation of primary
peristalsis with no substantial tertiary contractions or
presbyesophagus. 13 mm barium tablet passes readily into the stomach
when taken in an upright position with water.
IMPRESSION: 1. Proximal anterior esophageal web, nonobstructive to passage of a
13 mm barium tablet.
2. Otherwise unremarkable double contrast barium esophagram.

## 2022-09-24 MED ORDER — BUDESONIDE ER 9 MG PO TB24: ORAL_TABLET | ORAL | 1 refills | Status: DC

## 2022-09-25 ENCOUNTER — Other Ambulatory Visit: Payer: Self-pay

## 2022-09-25 ENCOUNTER — Encounter: Payer: Self-pay | Admitting: Internal Medicine

## 2022-09-25 DIAGNOSIS — M47896 Other spondylosis, lumbar region: Secondary | ICD-10-CM | POA: Diagnosis not present

## 2022-09-25 DIAGNOSIS — M47816 Spondylosis without myelopathy or radiculopathy, lumbar region: Secondary | ICD-10-CM | POA: Diagnosis not present

## 2022-09-25 MED ORDER — OMEPRAZOLE 40 MG PO CPDR: 40.0000 mg | DELAYED_RELEASE_CAPSULE | Freq: Every day | ORAL | 2 refills | Status: DC

## 2022-09-27 ENCOUNTER — Encounter: Payer: Self-pay | Admitting: Internal Medicine

## 2022-09-28 ENCOUNTER — Encounter: Payer: Self-pay | Admitting: Cardiovascular Disease

## 2022-09-28 ENCOUNTER — Other Ambulatory Visit: Payer: Self-pay | Admitting: Cardiovascular Disease

## 2022-09-28 DIAGNOSIS — R5381 Other malaise: Secondary | ICD-10-CM | POA: Diagnosis not present

## 2022-09-28 DIAGNOSIS — M6281 Muscle weakness (generalized): Secondary | ICD-10-CM | POA: Diagnosis not present

## 2022-09-28 DIAGNOSIS — G8929 Other chronic pain: Secondary | ICD-10-CM | POA: Diagnosis not present

## 2022-09-28 DIAGNOSIS — R2681 Unsteadiness on feet: Secondary | ICD-10-CM | POA: Diagnosis not present

## 2022-09-28 DIAGNOSIS — G4733 Obstructive sleep apnea (adult) (pediatric): Secondary | ICD-10-CM

## 2022-09-28 DIAGNOSIS — M545 Low back pain, unspecified: Secondary | ICD-10-CM | POA: Diagnosis not present

## 2022-09-28 NOTE — Progress Notes (Signed)
Patient ID: [name] [name], female   DOB: Dec 21, 1945, 77 y.o.   MRN: [id number]        HPI: [name] [name] is a 77 y.o. female percents in the office today for a 4 month followup sleep/cardiology evaluation.  [name] [name] is a retired [employer]  who has a history of hypertension, obesity, psoriatic arthritis, DVT, which occurred while traveling to [place], as well as complex obstructive sleep apnea.  She has had difficulty in the past with CPAP therapy. She has been able to tolerate BiPAP and has had a [name] BiPAP Auto unit well but also has had difficulty with some of her high pressure requirements. In April 2014 I changed her maximum BiPAP pressure to 19 and her maximum EPAP pressure to 15. I also reduced her minimum EPAP pressure to 6 and her minimal IPAP pressure to 10 with pressure support of 4 and changed her Bi_Flex to 3. She has been followed by [name] [name].  Clinically, she had felt markedly improved with BiPAP.  Compared to prior CPAP therapy.  She has psoriatic arthritis and is on Humira as well as methotrexate followed by rheumatology.  She also has a Charcot joint in her right foot.   She is on Cymbalta for pain relief relative to this. She does not routinely exercise.  She keeps busy moving around, but typically does not do aerobic activity.  She has not been successful with significant weight loss.   She recently, she has been taking lisinopril HCTZ 20/12.5 mg for hypertension.  She had experienced an episode of chest discomfort which she felt was like a cramp and she noticed this most when standing up, but was associated with diaphoresis and shortness of breath.  She does have family history for coronary artery disease both in her mother as well as in her maternal grandmother. She has not been able to be very active due to her Charcot foot.  She also had noticed some mild shortness of breath with activity.  She eventually was referred for a nuclear perfusion study on 01/31/2015 which  revealed normal perfusion and function; ejection fraction 64%.  When I saw her in February 2017, her BiPAP machine had begun to fail.  She continues to use CPAP with 100% compliance and cannot sleep without it.  She typically sleeps for at least 8 hours per night.  She had taken her BiPAP machine to [name] who stated that her machine essentially had stopped working and is in need for new machine.   She received a new [name] [name] [name] auto BiPAP unit in April 2017.  She uses a Customer service manager FX full face mask, medium size.  She feels that the machine has been working well but she has had difficulty with her sleep.  At times she feels that she is not getting enough pressure.  Previously she had been set on a BiPAP auto unit with an EPAP minimum of 8 and IPAP max of 19 , which had worked well.  I obtained a new download today from 02/16/2016 through 03/16/2016.  She is 100% compliant and is averaging 8 hours and 25 minutes of sleep.  Apparently, she is not been set on auto and has been on and IPAP of 14 and an EPAP of 10.  Her AHI is increased at 25.1. I changed her BiPAP mode from a fixed pressure to auto BiPAP initiating atenolol over 6 with potential titration up to 20/16.  I also allowed for pressure support to change from 4-6 as  needed.  In 2018, she felt well from a cardiac perspective, but has had significant infections including MRSA, strep throat, UTI, and she had fractured her right ankle.  As result, she has noticed some ankle swelling right greater than left.  She continues to use her BiPAP and believes she is feeling much better than she had in the past.  [name] is her DME company.  A download in the office from 01/26/2017 through 04/25/2017.  She is 100% compliance.  She is averaging 8 hours and 36 minutes of sleep per night.  Her average device IPAP pressure was 18.2 and average device CPAP pressure greater than 90% of the time was 14.1.  AHI continued to be elevated at 21.2. An Epworth scale  score was calculated in the office today and this endorsed at 4, arguing against daytime sleepiness.  I last saw her, she was sleeping adequate duration.  Her AHI remains elevated I suggested that she reduce ramp time from 30 minutes down to 20 minutes and increased her starting pressure from 4 up to 6 cm.  I saw her in September 2019 at which time she had again started to notice  symptoms where she was waking up at night.  She noted leaks from her mask at higher pressures.  She feels that she is more  tired.  She had some swelling right lower extremity greater than left and she has a Charcot right foot.  She had broken her right ankle in August 2018.  She denied any chest pain.  She is unaware of any cardiac arrhythmias.  Her daughter, [name] is a GI physician who had been practicing in [facility] and will be moving up to work with [employer].  She is grateful that she will now have her twin granddaughters close by.  She was recently placed on Cimzia for her psoriatic arthritis to take in addition to her methotrexate.  She is followed by [name] for her rheumatologic issues.   When I evaluated her in December 2019 she wassleeping 8 hours 29 minutes and her average IPAP pressure greater than 90% 16.7 with an average EPAP pressure of 12.4.  I reduced her ramp time down to 20 minutes.  Having issues with her mask and I recommended a trial of the new [name] [name] full facemask by either a [name] [name] or a [employer].  Over the past several months, she has not been able to be active due to her fracture of her fifth metatarsal.  She was in a hard cast for 2 months with no weightbearing.  As result she has not been able to exercise.  She has gained weight.  Was just removed several weeks ago.  She admits to ankle swelling.  She continues to use her BiPAP.  A new download was obtained and she again is compliant.  Her average device IPAP pressure greater than 90% of the time was 18.1 with  a maximum titrated IPAP pressure of 20.  AHI remained elevated at 17.9.    She went to [place] in January 2020 and unfortunately became ill and spent the entire trip in the hospital due to ischemic colitis.  She was subsequently evaluated by Fabian Sharp in a telemedicine visit in June 2020.  When I last evaluated her in a telemedicine visit in June 2021 she had not been able to do much walking because of tenderness from her bone on the plantar aspect of her foot.  She has fallen arches.  She  is being evaluated by Dr. Doran Durand.  Around Thanksgiving 2020 both she and her husband both became ill with COVID-19 pneumonia and both were hospitalized for approximately a week.  She had temperatures of 103, she was treated with remdesivir, convalescent plasma in addition to steroids.  She has subsequently not regained her sense of taste or smell and has continued to experience fatigability.  During that time she was not able to use BiPAP.  Apparently she was having issues stating that Medicare is going to deny her BiPAP.  She has continued to use BiPAP therapy.  I attempted to try to get a download from her [name] website today but we were unable to do this in the office.  I have reached out to Barry Brunner who will contact [name] to see if we can obtain her most recent download.  Ms. Burges continues to be on lisinopril/HCTZ for hypertension, rosuvastatin 5 mg for hyperlipidemia.  She was on methotrexate for rheumatoid arthritis but this was stopped approximately 6 weeks ago.  She is diabetic on metformin.     I scheduled her for a 2D echo Doppler study to reassess LV function following her Covid infection which was done on February 06, 2020.  LV function was excellent with EF of 65 to 70%.  There was grade 1 diastolic dysfunction.  There was mild mitral regurgitation.   I evaluated her in a telemedicine visit on May 24, 2020.  Since her last evaluation, her [name] [name] [name] has been on recall.  She has  registered with [name] and attempt to get a new machine.  She had called the office shortly thereafter wondering if she should still use therapy.  At that time with the severity of her sleep apnea and the incidence of only 0.3% of devices with problems I had recommended she continue therapy.  She uses [name] as her DME company.  I asked her today if she had been contacted regarding insertion of a bacteria inlet filter device which she could place into her tubing but apparently this had not been done by [name].  She feels well.  She again has been immobile and that her left leg is in a hard cast making it difficult to walk.  She does note some occasional ankle swelling.  She was wondering if she should continue with aspirin which she has been taking 81 mg 3 times a week.  She admits to being under increased stress since her husband unfortunately had a brain bleed but is starting to improve.  She has been on lisinopril HCT 20/25 mg.  She has continued to be on lipid-lowering therapy with rosuvastatin 5 mg and LDL cholesterol was 40.   I last saw her in August 2022.  She was ill in November 2021 with an intestinal virus, she required ICU she tells me she was cared for by Dr. Cristina Gong.  He apparently had an extensive hospital stay.  She has had difficulty with long COVID resulting from her COVID infection in November 2020.  She recently has been under months of extensive care for her left foot ulcers requiring wound care visits for her previous infections which has resolved.  From a sleep perspective, she has had difficulty and recently adjustments were made to her [name] auto BiPAP therapy.  Her device is on recall and she is not been aware of any timeframe when potentially she can receive a new device.  Due to continued difficulty with her unit, approximately 1 month ago her pressures  her pressures were adjusted such that now her ramp time is set at 20 minutes, ramp start pressure 6, minimum EPAP 14,  maximum IPAP 25, with pressure support range of 3 to 8 cm of water.  Her most recent download shows 100% usage with average use at 9 hours and 49 minutes per night.  Her 90% pressures are 22.3/19.0 with maximum titrated pressures at 25/23.  Despite this average AHI continues to be elevated at 16.3.    I  saw her on October 20, 2021.  3 days previously she had just  received the new [name] [name] BiPAP replacement device.  Her old device had died and she had received a "loaner "unit from her DME company.  Had lost 45 pounds since October 2022.  At times she  experiences some mild lightheadedness.  She was unaware of palpitations.    I last saw her in May 18, 2022.  Last week she was diagnosed with COVID infection after her son visited her and has been receiving molnuparivir therapy.  She has been using her new [name] [name] [name] [name] auto bilevel device.  However, the device had not been linked to our office.  A download was able to be obtained through the card.  It appears that her minimum EPAP is set at 9 with maximum IPAP 22 with a pressure support range of 0 to 5 cm.  Her humidification is at 3 and she does admit to dry mouth.  A download demonstrates increased AHI at 32.5 with device IPAP EPAP pressure greater than 90% of the time at 17.  As result it does not appear that there has been a pressure support and essentially for pressure support has been 0.  She does admit to some feet swelling.  She denied chest pain.  During that evaluation, I suggested changes be made to her device and recommended she be switched to a maximum IPAP setting of 25 and set her minimum EPAP pressure at 10 cm of water.  I also recommended she change pressure support to a range of 5 to 8 cm of water.  Presently, Ms. Balthazar tells me that her new [name] [name] machine has also been recalled.  A new download was obtained today but unfortunately this did not provide her most recent data.  AHI was 32.5 and apparently her 90% BiPAP and 90%  EPAP were the same at 17.4.  During her evaluation today I suggested that she bring her machine to her office tomorrow so that we can take her card out to obtain new data since apparently we could not obtain that today.  This again showed an significant AHI elevation at 33.6 and it appears that her IPAP and EPAP are similar in it is not utilizing the BiPAP pressures that were recommended.  Her maximum titrated IPAP and EPAP pressures are 22 and 90th percentile pressures were 17.9 and 17.8, respectively.  She continues to use therapy for the nights duration averaging 9 hours and 26 minutes.  She continues to be on carvedilol 6.25 mg twice a day, HCTZ 12.5 Miller ems as needed, lisinopril 20 mg daily for hypertension.  She is diabetic on metformin 500 mg twice a day.  She is on rosuvastatin 5 mg daily for hyperlipidemia.  LDL cholesterol on September 17, 2022 was 57.  She presents for evaluation.  Past Medical History:  Diagnosis Date   Arthritis    Closed fracture of fifth metatarsal bone 05/13/2018   Deep vein thrombosis (HCC)    right calf -  05/2012    Diabetes mellitus without complication (HCC)    diet controlled    GERD (gastroesophageal reflux disease)    Hyperlipidemia    Hypertension    Ejection fraction =>55% Left ventricular systolic function is normal. Left ventricular wall motion is normal     Lymphocytic colitis    Neuropathy    diabetic - in bilateral feet    Pityriasis lichenoides chronica    Sleep apnea    bipap    Past Surgical History:  Procedure Laterality Date   DILATION AND CURETTAGE OF UTERUS     EYE SURGERY  07/2020   HAMMER TOE SURGERY     right hand surgery      due to blood infection    torn meniscus repair      right knee    TOTAL HIP ARTHROPLASTY  09/13/2012   Procedure: TOTAL HIP ARTHROPLASTY ANTERIOR APPROACH;  Surgeon: Mauri Pole, MD;  Location: [facility];  Service: Orthopedics;  Laterality: Right;    Allergies  Allergen Reactions   Other Shortness Of  Breath    Blue fish: palms and feet turn red   Thorazine [Chlorpromazine] Other (See Comments)    Made her more more off balanced, vision   Bee Venom Swelling   Farxiga [Dapagliflozin]     Recurrent yeast infections   Semaglutide     Rybelsus - lip swelling, GI upset   Sulfa Antibiotics Hives    Current Outpatient Medications  Medication Sig Dispense Refill   Accu-Chek Softclix Lancets lancets Use as instructed to check sugars 2-3 times daily.  E11.42 100 each 12   acetaminophen (TYLENOL) 325 MG tablet Take 2 tablets (650 mg total) by mouth every 6 (six) hours as needed for mild pain or headache.     Alcohol Swabs PADS UAD to check sugars.  E11.65 100 each 12   allopurinol (ZYLOPRIM) 100 MG tablet TAKE 1 TABLET(100 MG) BY MOUTH TWICE DAILY 180 tablet 3   ALPRAZolam (XANAX) 0.25 MG tablet TAKE 1 TABLET BY MOUTH 3 TIMES DAILY AS NEEDED FOR ANXIETY. 20 tablet 0   augmented betamethasone dipropionate (DIPROLENE-AF) 0.05 % cream      azelastine (ASTELIN) 0.1 % nasal spray INSTILL 2 SPRAYS IN EACH NOSTRIL TWICE DAILY 30 mL 12   Blood Glucose Monitoring Suppl (ACCU-CHEK GUIDE) w/Device KIT AS DIRECTED 1 kit 0   carvedilol (COREG) 6.25 MG tablet TAKE 1 TABLET(6.25 MG) BY MOUTH TWICE DAILY WITH A MEAL 60 tablet 3   Certolizumab Pegol (CIMZIA Fannett) Inject 400 mg into the skin every 30 (thirty) days. 200 mg on each side of stomach     Cholecalciferol (VITAMIN D3) 1000 UNITS CAPS Take 1,000 Units by mouth 2 (two) times daily.      ciclopirox (LOPROX) 0.77 % cream Apply topically.     clobetasol cream (TEMOVATE) [id number] % Apply 1 application. topically 2 (two) times daily.     Cyanocobalamin (VITAMIN B-12) 1000 MCG SUBL Place 1,000 mcg under the tongue daily.     cyclobenzaprine (FLEXERIL) 10 MG tablet TAKE 1 TABLET BY MOUTH AT BEDTIME 90 tablet 1   docusate sodium (COLACE) 100 MG capsule TAKE 1 CAPSULE(100 MG) BY MOUTH TWICE DAILY 60 capsule 2   folic acid (FOLVITE) 1 MG tablet TAKE 1 TABLET(1 MG) BY  MOUTH DAILY 90 tablet 3   glucose blood (ACCU-CHEK GUIDE) test strip USE AS INSTRUCTED TO CHECK SUGARS 2-3 TIMES DAILY 100 strip 12   glucose blood (ONETOUCH VERIO) test strip one  strip (1 each dose) daily.     HYDROcodone-acetaminophen (NORCO) 10-325 MG tablet Take 1 tablet by mouth 3 (three) times daily as needed.     insulin glargine (LANTUS SOLOSTAR) 100 UNIT/ML Solostar Pen Inject 20 Units into the skin 2 (two) times daily. 15 mL 11   Insulin Pen Needle (B-D UF III MINI PEN NEEDLES) 31G X 5 MM MISC Use daily for insulin pen 90 each 3   lisinopril (ZESTRIL) 20 MG tablet Take 1 tablet (20 mg total) by mouth daily. 90 tablet 3   loperamide (IMODIUM A-D) 2 MG tablet Take 1 tablet (2 mg total) by mouth 4 (four) times daily as needed for diarrhea or loose stools. 360 tablet 3   Melatonin 5 MG SUBL Place 5 mg under the tongue at bedtime.     metFORMIN (GLUCOPHAGE-XR) 500 MG 24 hr tablet TAKE 1 TABLET BY MOUTH TWICE A DAY 180 tablet 2   Methotrexate 2.5 MG/ML SOLN TAKE 8 TABLETSALL AT THE SAME TIME Orally ONCE WEEKLY for 90 days     naftifine (NAFTIN) 1 % cream Apply 1 application. topically daily as needed (rash).     oxyCODONE-acetaminophen (PERCOCET) 10-325 MG tablet      potassium chloride (KLOR-CON M) 10 MEQ tablet Take 2 tablets (20 mEq total) by mouth daily. 180 tablet 3   Probiotic Product (ALIGN PO) Take 1 capsule by mouth daily.     rosuvastatin (CRESTOR) 5 MG tablet TAKE 1 TABLET(5 MG) BY MOUTH DAILY 90 tablet 3   sertraline (ZOLOFT) 100 MG tablet Take 1.5 tablets (150 mg total) by mouth daily. 135 tablet 1   Budesonide ER 9 MG TB24 TAKE 1 TABLET BY MOUTH EVERY DAY IN THE AFTERNOON 90 tablet 1   hydrochlorothiazide (MICROZIDE) 12.5 MG capsule Take 1 capsule (12.5 mg total) by mouth as needed (for swelling). 30 capsule 3   omeprazole (PRILOSEC) 40 MG capsule Take 1 capsule (40 mg total) by mouth daily. 90 capsule 2   No current facility-administered medications for this visit.     Socially she is widowed. She has 2 children and 2 grandchildren. Her daughter is a Engineer, drilling. There is no tobacco use. She does drink occasional alcohol.  ROS General: Negative; No fevers, chills, or night sweats; 45 pound weight loss since October 2022 HEENT: Negative; No changes in vision or hearing, sinus congestion, difficulty swallowing Pulmonary: Negative; No cough, wheezing, shortness of breath, hemoptysis Cardiovascular: Negative; No chest pain, presyncope, syncope, palpitations; occasional feet swelling GI: History of lymphocytic colitis GU: Negative; No dysuria, hematuria, or difficulty voiding Musculoskeletal: Positive for R foot charcot joint; recent right ankle fracture Hematologic/Oncology: Negative; no easy bruising, bleeding Rheumatologic: Psoriatic arthritis;  History of gout Endocrine: Negative; no heat/cold intolerance; no diabetes Neuro: Negative; no changes in balance, headaches Skin: History of psoriatic arthritis Psychiatric: Negative; No behavioral problems, depression Sleep: Positive for complex sleep apnea on BiPAP therapy;  No residual snoring, daytime sleepiness, hypersomnolence, bruxism, restless legs, hypnogognic hallucinations, no cataplexy Other comprehensive 14 point system review is negative.  PE BP 138/74   Pulse 72   Ht 5' 6"$  (1.676 m)   Wt 208 lb 1.6 oz (94.4 kg)   SpO2 99%   BMI 33.59 kg/m    Repeat blood pressure by me was 130/70  Wt Readings from Last 3 Encounters:  09/22/22 208 lb 1.6 oz (94.4 kg)  09/17/22 200 lb (90.7 kg)  05/18/22 207 lb (93.9 kg)   General: Alert, oriented, no distress.  Skin: normal  turgor, no rashes, warm and dry HEENT: Normocephalic, atraumatic. Pupils equal round and reactive to light; sclera anicteric; extraocular muscles intact;  Nose without nasal septal hypertrophy Mouth/Parynx benign; Mallinpatti scale 3 Neck: No JVD, no carotid bruits; normal carotid upstroke Lungs: clear to ausculatation and  percussion; no wheezing or rales Chest wall: without tenderness to palpitation Heart: PMI not displaced, RRR, s1 s2 normal, 1/6 systolic murmur, no diastolic murmur, no rubs, gallops, thrills, or heaves Abdomen: soft, nontender; no hepatosplenomehaly, BS+; abdominal aorta nontender and not dilated by palpation. Back: no CVA tenderness Pulses 2+ Musculoskeletal: full range of motion, normal strength, no joint deformities Extremities: no clubbing cyanosis or edema, Homan's sign negative  Neurologic: grossly nonfocal; Cranial nerves grossly wnl Psychologic: Normal mood and affect    October 20, 2021 ECG (independently read by me):  Sinus rhythm at 77, PAC, IRBBB,     April 07, 2021 ECG (independently read by me): NSR at 75, RBBB, no ectopy  December 2019 ECG (independently read by me): Sinus rhythm at 78 bpm.  Mild sinus arrhythmia.  Right bundle branch block with repolarization changes.  April 2019 ECG (independently read by me): Normal sinus rhythm at 83 bpm.  Right bundle branch block with repolarization changes.  QTc interval 4 6 0 ms.  September 2018 ECG (independently read by me): normal sinus rhythm at 79 bpm.  Right bundle-branch block.  QTc interval 481 ms.  June 2016 ECG (independently read by me):  Normal sinus rhythm with right bundle branch block.  October 2015 ECG (independently read by me): Normal sinus rhythm at 71 beats per minute right bundle branch block with repolarization changes.  Normal intervals.  Prior July 2014 ECG sinus rhythm with incomplete right bundle branch block. QRS duration 118 ms. Nonspecific ST-T changes.  LABS:    Latest Ref Rng & Units 09/17/2022   10:49 AM 03/12/2022   10:26 AM 09/16/2021    9:51 AM  BMP  Glucose 70 - 99 mg/dL 102  87  121   BUN 6 - 23 mg/dL 25  20  18   $ Creatinine 0.40 - 1.20 mg/dL 0.93  0.85  0.88   Sodium 135 - 145 mEq/L 134  135  134   Potassium 3.5 - 5.1 mEq/L 5.5  4.5  4.9   Chloride 96 - 112 mEq/L 98  100  96   CO2 19 - 32  mEq/L 28  29  32   Calcium 8.4 - 10.5 mg/dL 9.9  10.1  10.9       Latest Ref Rng & Units 09/17/2022   10:49 AM 03/12/2022   10:26 AM 09/16/2021    9:51 AM  Hepatic Function  Total Protein 6.0 - 8.3 g/dL 6.4  6.7  6.9   Albumin 3.5 - 5.2 g/dL 3.7  3.9  4.1   AST 0 - 37 U/L 17  20  35   ALT 0 - 35 U/L 15  16  31   $ Alk Phosphatase 39 - 117 U/L 64  65  59   Total Bilirubin 0.2 - 1.2 mg/dL 0.3  0.4  0.6       Latest Ref Rng & Units 09/17/2022   10:49 AM 03/12/2022   10:26 AM 05/27/2021    3:00 PM  CBC  WBC 4.0 - 10.5 K/uL 10.6  12.9  11.9   Hemoglobin 12.0 - 15.0 g/dL 10.7  11.1  11.8   Hematocrit 36.0 - 46.0 % 32.3  35.1  37.7  Platelets 150.0 - 400.0 K/uL 264.0  285.0  253    Lab Results  Component Value Date   MCV 89.2 09/17/2022   MCV 91.6 03/12/2022   MCV 90.4 05/27/2021   Lab Results  Component Value Date   TSH 2.32 09/17/2022    Lab Results  Component Value Date   HGBA1C 6.3 09/17/2022    Lipid Panel     Component Value Date/Time   CHOL 124 09/17/2022 1049   TRIG 102.0 09/17/2022 1049   HDL 47.00 09/17/2022 1049   CHOLHDL 3 09/17/2022 1049   VLDL 20.4 09/17/2022 1049   LDLCALC 57 09/17/2022 1049   LDLDIRECT 139.0 08/15/2019 1114      RADIOLOGY: No results found.  IMPRESSION:  1. OSA (obstructive sleep apnea)   2. Essential hypertension   3. Ankle edema   4. Hyperlipidemia with target LDL less than 70   5. Type 2 diabetes mellitus with complication, without long-term current use of insulin ([place])   6. Hyperkalemia   7. Class 1 obesity due to excess calories with serious comorbidity and body mass index (BMI) of 33.0 to 33.9 in adult     ASSESSMENT AND PLAN: Ms. 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   She sees [name] for rheumatology and had seen Dr. Cristina Gong for her lymphocytic colitis.  She had broken her ankle remotely  which had significantly limited her mobility and ability to walk and exercise.  She has had significant difficulty requiring a longstanding hospitalization with GI issues.  In addition she has been plagued by a long COVID syndrome and her initial protracted COVID infection from November 2020.  She has been undergoing wound care for an infection in her left foot which has been an issue since October 2021 and had been seeing Dr. Doran Durand and Jeri Cos, PA-C at wound clinic with resolution of her prior infection.  Most recently she is being cared in [place] at [facility].  Her son recently visited her and she tested positive for COVID last week and has been receiving molnupiravir for the past 4 days.  From a sleep perspective, she had been using a DreamStation auto BiPAP unit which was under recall.  Her old machine ultimately malfunctioned and she was given a loaner device which she had been using through her DME company.  After a very long delay, she finally received a new [name] DreamStation auto BiPAP device.  [name] is her DME company.  When I saw her in October 2023 it appears that she may not be having any pressure support since her device IPAP pressure greater than 90% of the time was 17.4 with device CPAP pressure at 17.0.  Her AHI is significantly increased at 32.5 present study and her maximum IPAP titration was 22 as well as maximum titrated EPAP pressure.  At that visit I changed her settings to a maximum IPAP of 25 and EPAP minimum pressure of 10 with pressure support range of 5 to 8 cm.  Apparently her most recent download does not show these changes and she tells me that her recently received [name] [name] device has also been recalled.  Presently, I have recommended a new evaluation and we will schedule her for an in lab BiPAP/possible ASV titration and following this study she will receive a new ResMed device.  Her blood pressure today is stable and on repeat  by me was 136/70 on carvedilol 6.25  mg twice a day in addition to lisinopril 20 mg daily.  She has a prescription for HCTZ which she takes as needed if swelling occurs.  She is on rosuvastatin low-dose 5 mg per hyperlipidemia.  Most recent LDL cholesterol was 57 on September 17, 2022.  Potassium was elevated at 5.5 and she was advised to reduce potassium containing foods and discontinue supplemental potassium.  I will see her in 3 to 4 months for follow-up evaluation or sooner as needed.     Troy Sine, MD, [facility]  09/28/2022 10:13 AM

## 2022-09-29 ENCOUNTER — Other Ambulatory Visit: Payer: Self-pay | Admitting: Internal Medicine

## 2022-09-29 ENCOUNTER — Other Ambulatory Visit: Payer: Self-pay

## 2022-09-30 DIAGNOSIS — R5381 Other malaise: Secondary | ICD-10-CM | POA: Diagnosis not present

## 2022-09-30 DIAGNOSIS — R2681 Unsteadiness on feet: Secondary | ICD-10-CM | POA: Diagnosis not present

## 2022-09-30 DIAGNOSIS — G8929 Other chronic pain: Secondary | ICD-10-CM | POA: Diagnosis not present

## 2022-09-30 DIAGNOSIS — M545 Low back pain, unspecified: Secondary | ICD-10-CM | POA: Diagnosis not present

## 2022-09-30 DIAGNOSIS — M6281 Muscle weakness (generalized): Secondary | ICD-10-CM | POA: Diagnosis not present

## 2022-10-01 ENCOUNTER — Encounter: Payer: Self-pay | Admitting: Internal Medicine

## 2022-10-01 ENCOUNTER — Telehealth: Payer: Self-pay

## 2022-10-01 ENCOUNTER — Ambulatory Visit (INDEPENDENT_AMBULATORY_CARE_PROVIDER_SITE_OTHER): Payer: Medicare Other

## 2022-10-01 VITALS — Ht 65.0 in | Wt 208.1 lb

## 2022-10-01 DIAGNOSIS — Z0000 Encounter for general adult medical examination without abnormal findings: Secondary | ICD-10-CM | POA: Diagnosis not present

## 2022-10-01 NOTE — Patient Instructions (Addendum)
[name] , Thank you for taking time to come for your Medicare Wellness Visit. I appreciate your ongoing commitment to your health goals. Please review the following plan we discussed and let me know if I can assist you in the future.   These are the goals we discussed:  Goals      Patient Stated     I want to regain my quality of life by improving my feet and start back being active again.        This is a list of the screening recommended for you and due dates:  Health Maintenance  Topic Date Due   COVID-19 Vaccine (10 - 2023-24 season) 10/03/2022*   Hepatitis C Screening: USPSTF Recommendation to screen - Ages 18-79 yo.  11/18/2068*   Hemoglobin A1C  03/18/2023   Eye exam for diabetics  06/02/2023   Complete foot exam   09/02/2023   Yearly kidney function blood test for diabetes  09/18/2023   Yearly kidney health urinalysis for diabetes  09/18/2023   Medicare Annual Wellness Visit  10/02/2023   DEXA scan (bone density measurement)  05/05/2025   DTaP/Tdap/Td vaccine (3 - Tdap) 09/13/2028   Pneumonia Vaccine  Completed   Flu Shot  Completed   Zoster (Shingles) Vaccine  Completed   HPV Vaccine  Aged Out   Colon Cancer Screening  Discontinued  *Topic was postponed. The date shown is not the original due date.   Immunization History  Administered Date(s) Administered   Fluad Quad(high Dose 65+) 05/20/2020, 05/26/2021   Influenza Split 04/11/2018   Influenza, High Dose Seasonal PF 04/27/2016, 04/29/2017, 05/09/2019   Influenza,inj,Quad PF,6+ Mos 05/18/2014, 05/11/2019   Influenza,inj,quad, With Preservative 04/12/2017   Influenza-Unspecified 05/10/2014, 05/23/2022   Moderna Covid-19 Vaccine Bivalent Booster 80yr & up 04/22/2021, 12/01/2021, 05/10/2022   Moderna Sars-Covid-2 Vaccination 09/09/2019, 10/07/2019, 04/09/2020, 09/19/2020   Pneumococcal Conjugate-13 10/02/2014   Pneumococcal Polysaccharide-23 11/20/2010, 07/28/2018   Respiratory Syncytial Virus Vaccine,Recomb  Aduvanted(Arexvy) 06/20/2022   Rsv, Mab, NKathie Rhodes 0.5 Ml, Neonate To 24 Mos(Beyfortus) 06/20/2022   Td 08/10/2008, 09/13/2018   Unspecified SARS-COV-2 Vaccination 10/10/2019, 09/19/2020   Zoster Recombinat (Shingrix) 03/12/2017, 02/17/2018   Advanced directives: Yes, documents on file.  Conditions/risks identified: Yes, Type II Diabetes Mellitus  Next appointment: Follow up in one year for your annual wellness visit.   Preventive Care 647Years and Older, Female Preventive care refers to lifestyle choices and visits with your health care provider that can promote health and wellness. What does preventive care include? A yearly physical exam. This is also called an annual well check. Dental exams once or twice a year. Routine eye exams. Ask your health care provider how often you should have your eyes checked. Personal lifestyle choices, including: Daily care of your teeth and gums. Regular physical activity. Eating a healthy diet. Avoiding tobacco and drug use. Limiting alcohol use. Practicing safe sex. Taking low-dose aspirin every day. Taking vitamin and mineral supplements as recommended by your health care provider. What happens during an annual well check? The services and screenings done by your health care provider during your annual well check will depend on your age, overall health, lifestyle risk factors, and family history of disease. Counseling  Your health care provider may ask you questions about your: Alcohol use. Tobacco use. Drug use. Emotional well-being. Home and relationship well-being. Sexual activity. Eating habits. History of falls. Memory and ability to understand (cognition). Work and work [employer] Reproductive health. Screening  You may have the following tests  or measurements: Height, weight, and BMI. Blood pressure. Lipid and cholesterol levels. These may be checked every 5 years, or more frequently if you are over 30 years  old. Skin check. Lung cancer screening. You may have this screening every year starting at age 44 if you have a 30-pack-year history of smoking and currently smoke or have quit within the past 15 years. Fecal occult blood test (FOBT) of the stool. You may have this test every year starting at age 74. Flexible sigmoidoscopy or colonoscopy. You may have a sigmoidoscopy every 5 years or a colonoscopy every 10 years starting at age 5. Hepatitis C blood test. Hepatitis B blood test. Sexually transmitted disease (STD) testing. Diabetes screening. This is done by checking your blood sugar (glucose) after you have not eaten for a while (fasting). You may have this done every 1-3 years. Bone density scan. This is done to screen for osteoporosis. You may have this done starting at age 47. Mammogram. This may be done every 1-2 years. Talk to your health care provider about how often you should have regular mammograms. Talk with your health care provider about your test results, treatment options, and if necessary, the need for more tests. Vaccines  Your health care provider may recommend certain vaccines, such as: Influenza vaccine. This is recommended every year. Tetanus, diphtheria, and acellular pertussis (Tdap, Td) vaccine. You may need a Td booster every 10 years. Zoster vaccine. You may need this after age 46. Pneumococcal 13-valent conjugate (PCV13) vaccine. One dose is recommended after age 52. Pneumococcal polysaccharide (PPSV23) vaccine. One dose is recommended after age 21. Talk to your health care provider about which screenings and vaccines you need and how often you need them. This information is not intended to replace advice given to you by your health care provider. Make sure you discuss any questions you have with your health care provider. Document Released: 08/23/2015 Document Revised: 04/15/2016 Document Reviewed: 05/28/2015 Elsevier Interactive Patient Education  2017 [place] Prevention in the Home Falls can cause injuries. They can happen to people of all ages. There are many things you can do to make your home safe and to help prevent falls. What can I do on the outside of my home? Regularly fix the edges of walkways and driveways and fix any cracks. Remove anything that might make you trip as you walk through a door, such as a raised step or threshold. Trim any bushes or trees on the path to your home. Use bright outdoor lighting. Clear any walking paths of anything that might make someone trip, such as rocks or tools. Regularly check to see if handrails are loose or broken. Make sure that both sides of any steps have handrails. Any raised decks and porches should have guardrails on the edges. Have any leaves, snow, or ice cleared regularly. Use sand or salt on walking paths during winter. Clean up any spills in your garage right away. This includes oil or grease spills. What can I do in the bathroom? Use night lights. Install grab bars by the toilet and in the tub and shower. Do not use towel bars as grab bars. Use non-skid mats or decals in the tub or shower. If you need to sit down in the shower, use a plastic, non-slip stool. Keep the floor dry. Clean up any water that spills on the floor as soon as it happens. Remove soap buildup in the tub or shower regularly. Attach bath mats securely with double-sided  non-slip rug tape. Do not have throw rugs and other things on the floor that can make you trip. What can I do in the bedroom? Use night lights. Make sure that you have a light by your bed that is easy to reach. Do not use any sheets or blankets that are too big for your bed. They should not hang down onto the floor. Have a firm chair that has side arms. You can use this for support while you get dressed. Do not have throw rugs and other things on the floor that can make you trip. What can I do in the kitchen? Clean up any spills right  away. Avoid walking on wet floors. Keep items that you use a lot in easy-to-reach places. If you need to reach something above you, use a strong step stool that has a grab bar. Keep electrical cords out of the way. Do not use floor polish or wax that makes floors slippery. If you must use wax, use non-skid floor wax. Do not have throw rugs and other things on the floor that can make you trip. What can I do with my stairs? Do not leave any items on the stairs. Make sure that there are handrails on both sides of the stairs and use them. Fix handrails that are broken or loose. Make sure that handrails are as long as the stairways. Check any carpeting to make sure that it is firmly attached to the stairs. Fix any carpet that is loose or worn. Avoid having throw rugs at the top or bottom of the stairs. If you do have throw rugs, attach them to the floor with carpet tape. Make sure that you have a light switch at the top of the stairs and the bottom of the stairs. If you do not have them, ask someone to add them for you. What else can I do to help prevent falls? Wear shoes that: Do not have high heels. Have rubber bottoms. Are comfortable and fit you well. Are closed at the toe. Do not wear sandals. If you use a stepladder: Make sure that it is fully opened. Do not climb a closed stepladder. Make sure that both sides of the stepladder are locked into place. Ask someone to hold it for you, if possible. Clearly mark and make sure that you can see: Any grab bars or handrails. First and last steps. Where the edge of each step is. Use tools that help you move around (mobility aids) if they are needed. These include: Canes. Walkers. Scooters. Crutches. Turn on the lights when you go into a dark area. Replace any light bulbs as soon as they burn out. Set up your furniture so you have a clear path. Avoid moving your furniture around. If any of your floors are uneven, fix them. If there are any  pets around you, be aware of where they are. Review your medicines with your doctor. Some medicines can make you feel dizzy. This can increase your chance of falling. Ask your doctor what other things that you can do to help prevent falls. This information is not intended to replace advice given to you by your health care provider. Make sure you discuss any questions you have with your health care provider. Document Released: 05/23/2009 Document Revised: 01/02/2016 Document Reviewed: 08/31/2014 Elsevier Interactive Patient Education  2017 [employer].

## 2022-10-01 NOTE — Progress Notes (Signed)
I connected with  [name] [name] on 10/01/2022 at 1:30 p.m. EST by telephone and verified that I am speaking with the correct person using two identifiers.  Location: Patient: Home Provider: [place] Persons participating in the virtual visit: [name] [name]   I discussed the limitations, risks, security and privacy concerns of performing an evaluation and management service by telephone and the availability of in person appointments. The patient expressed understanding and agreed to proceed.  Interactive audio and video telecommunications were attempted between this nurse and patient, however failed, due to patient having technical difficulties OR patient did not have access to video capability.  We continued and completed visit with audio only.  Some vital signs may be absent or patient reported.   [name] Flow, LPN  Subjective:   [name] [name] is a 77 y.o. female who presents for Medicare Annual (Subsequent) preventive examination.  Review of Systems     Cardiac Risk Factors include: advanced age (>13mn, >>7women);diabetes mellitus;dyslipidemia;hypertension;obesity (BMI >30kg/m2);family history of premature cardiovascular disease     Objective:    Today's Vitals   10/01/22 1335  Weight: 208 lb 1.8 oz (94.4 kg)  Height: 5' 5"$  (1.651 m)  PainSc: 5   PainLoc: Back   Body mass index is 34.63 kg/m.     10/01/2022    1:39 PM 09/16/2021    9:03 AM 05/13/2021    8:52 AM 07/12/2020    5:59 PM 05/03/2020    9:07 AM 03/12/2020    4:10 PM 07/08/2019    2:59 AM  Advanced Directives  Does Patient Have a Medical Advance Directive? Yes Yes Yes  Yes No   Type of AParamedicof ANew SiteLiving will Living will [place] will  [place] will    Does patient want to make changes to medical advance directive? No - Patient declined No - Patient declined   No - Patient declined    Copy of  HK[place]in Chart? Yes - validated most recent copy scanned in chart (See row information)  No - copy requested  No - copy requested    Would patient like information on creating a medical advance directive?       No - Patient declined     Information is confidential and restricted. Go to Review Flowsheets to unlock data.    Current Medications (verified) Outpatient Encounter Medications as of 10/01/2022  Medication Sig   Accu-Chek Softclix Lancets lancets Use as instructed to check sugars 2-3 times daily.  E11.42   acetaminophen (TYLENOL) 325 MG tablet Take 2 tablets (650 mg total) by mouth every 6 (six) hours as needed for mild pain or headache.   Alcohol Swabs PADS UAD to check sugars.  E11.65   allopurinol (ZYLOPRIM) 100 MG tablet TAKE 1 TABLET(100 MG) BY MOUTH TWICE DAILY   ALPRAZolam (XANAX) 0.25 MG tablet TAKE 1 TABLET BY MOUTH THREE TIMES A DAY AS NEEDED FOR ANXIETY   augmented betamethasone dipropionate (DIPROLENE-AF) 0.05 % cream    azelastine (ASTELIN) 0.1 % nasal spray INSTILL 2 SPRAYS IN EACH NOSTRIL TWICE DAILY   Blood Glucose Monitoring Suppl (ACCU-CHEK GUIDE) w/Device KIT AS DIRECTED   Budesonide ER 9 MG TB24 TAKE 1 TABLET BY MOUTH EVERY DAY IN THE AFTERNOON   carvedilol (COREG) 6.25 MG tablet TAKE 1 TABLET BY MOUTH TWICE A DAY WITH A MEAL   Certolizumab Pegol (CIMZIA [name] [name]) Inject 400 mg into the skin every 30 (thirty)  days. 200 mg on each side of stomach   Cholecalciferol (VITAMIN D3) 1000 UNITS CAPS Take 1,000 Units by mouth 2 (two) times daily.    ciclopirox (LOPROX) 0.77 % cream Apply topically.   clobetasol cream (TEMOVATE) [id number] % Apply 1 application. topically 2 (two) times daily.   Cyanocobalamin (VITAMIN B-12) 1000 MCG SUBL Place 1,000 mcg under the tongue daily.   cyclobenzaprine (FLEXERIL) 10 MG tablet TAKE 1 TABLET BY MOUTH AT BEDTIME   docusate sodium (COLACE) 100 MG capsule TAKE 1 CAPSULE(100 MG) BY MOUTH TWICE DAILY   folic acid (FOLVITE) 1 MG  tablet TAKE 1 TABLET(1 MG) BY MOUTH DAILY   glucose blood (ACCU-CHEK GUIDE) test strip USE AS INSTRUCTED TO CHECK SUGARS 2-3 TIMES DAILY   glucose blood (ONETOUCH VERIO) test strip one strip (1 each dose) daily.   hydrochlorothiazide (MICROZIDE) 12.5 MG capsule Take 1 capsule (12.5 mg total) by mouth as needed (for swelling).   HYDROcodone-acetaminophen (NORCO) 10-325 MG tablet Take 1 tablet by mouth 3 (three) times daily as needed.   insulin glargine (LANTUS SOLOSTAR) 100 UNIT/ML Solostar Pen Inject 20 Units into the skin 2 (two) times daily.   Insulin Pen Needle (B-D UF III MINI PEN NEEDLES) 31G X 5 MM MISC Use daily for insulin pen   lisinopril (ZESTRIL) 20 MG tablet Take 1 tablet (20 mg total) by mouth daily.   loperamide (IMODIUM A-D) 2 MG tablet Take 1 tablet (2 mg total) by mouth 4 (four) times daily as needed for diarrhea or loose stools.   Melatonin 5 MG SUBL Place 5 mg under the tongue at bedtime.   metFORMIN (GLUCOPHAGE-XR) 500 MG 24 hr tablet TAKE 1 TABLET BY MOUTH TWICE A DAY   Methotrexate 2.5 MG/ML SOLN TAKE 8 TABLETSALL AT THE SAME TIME Orally ONCE WEEKLY for 90 days   naftifine (NAFTIN) 1 % cream Apply 1 application. topically daily as needed (rash).   omeprazole (PRILOSEC) 40 MG capsule Take 1 capsule (40 mg total) by mouth daily.   oxyCODONE-acetaminophen (PERCOCET) 10-325 MG tablet    potassium chloride (KLOR-CON M) 10 MEQ tablet Take 2 tablets (20 mEq total) by mouth daily.   Probiotic Product (ALIGN PO) Take 1 capsule by mouth daily.   rosuvastatin (CRESTOR) 5 MG tablet TAKE 1 TABLET(5 MG) BY MOUTH DAILY   sertraline (ZOLOFT) 100 MG tablet Take 1.5 tablets (150 mg total) by mouth daily.   No facility-administered encounter medications on file as of 10/01/2022.    Allergies (verified) Other, Thorazine [chlorpromazine], Bee venom, Farxiga [dapagliflozin], Semaglutide, and Sulfa antibiotics   History: Past Medical History:  Diagnosis Date   Arthritis    Closed fracture  of fifth metatarsal bone 05/13/2018   Deep vein thrombosis ([name] [name])    right calf - 05/2012    Diabetes mellitus without complication ([name] [name])    diet controlled    GERD (gastroesophageal reflux disease)    Hyperlipidemia    Hypertension    Ejection fraction =>55% Left ventricular systolic function is normal. Left ventricular wall motion is normal     Lymphocytic colitis    Neuropathy    diabetic - in bilateral feet    Pityriasis lichenoides chronica    Sleep apnea    bipap   Past Surgical History:  Procedure Laterality Date   DILATION AND CURETTAGE OF UTERUS     EYE SURGERY  07/2020   HAMMER TOE SURGERY     right hand surgery      due to blood infection  torn meniscus repair      right knee    TOTAL HIP ARTHROPLASTY  09/13/2012   Procedure: TOTAL HIP ARTHROPLASTY ANTERIOR APPROACH;  Surgeon: Mauri Pole, MD;  Location: [facility];  Service: Orthopedics;  Laterality: Right;   Family History  Problem Relation Age of Onset   Hypertension Mother    Social History   Socioeconomic History   Marital status: Divorced    Spouse name: Not on file   Number of children: Not on file   Years of education: Not on file   Highest education level: Not on file  Occupational History   Not on file  Tobacco Use   Smoking status: Never   Smokeless tobacco: Never  Substance and Sexual Activity   Alcohol use: Yes    Comment: occasional wine    Drug use: No   Sexual activity: Not on file  Other Topics Concern   Not on file  Social History Narrative   She is widowed. She has 2 children, 2 grandchildren. She does drink occasional alcohol . There is no tobacco . She does not have a routine exercise program   Social Determinants of Health   Financial Resource Strain: Low Risk  (10/01/2022)   Overall Financial Resource Strain (CARDIA)    Difficulty of Paying Living Expenses: Not hard at all  Food Insecurity: No Food Insecurity (10/01/2022)   Hunger Vital Sign    Worried About Running Out of  Food in the Last Year: Never true    Ran Out of Food in the Last Year: Never true  Transportation Needs: No Transportation Needs (10/01/2022)   PRAPARE - Hydrologist (Medical): No    Lack of Transportation (Non-Medical): No  Physical Activity: Insufficiently Active (10/01/2022)   Exercise Vital Sign    Days of Exercise per Week: 2 days    Minutes of Exercise per Session: 60 min  Stress: No Stress Concern Present (10/01/2022)   [place]    Feeling of Stress : Not at all  Social Connections: [place] (10/01/2022)   Social Connection and Isolation Panel [NHANES]    Frequency of Communication with Friends and Family: Twice a week    Frequency of Social Gatherings with Friends and Family: Twice a week    Attends Religious Services: More than 4 times per year    Active Member of [employer] or Organizations: Yes    Attends Music therapist: More than 4 times per year    Marital Status: Living with partner    Tobacco Counseling Counseling given: Not Answered   Clinical Intake:  Pre-visit preparation completed: Yes  Pain : No/denies pain Pain Score: 5      BMI - recorded: 34.63 Nutritional Status: BMI > 30  Obese Nutritional Risks: None Diabetes: No  How often do you need to have someone help you when you read instructions, pamphlets, or other written materials from your doctor or pharmacy?: 1 - Never What is the last grade level you completed in school?: HSG  Nutrition Risk Assessment:  Has the patient had any N/V/D within the last 2 months?  No  Does the patient have any non-healing wounds?  No  Has the patient had any unintentional weight loss or weight gain?  No   Diabetes:  Is the patient diabetic?  Yes  If diabetic, was a CBG obtained today?  No  Did the patient bring in their glucometer from home?  No  How often do you monitor your CBG's? 2-3 times  daily.   Financial Strains and Diabetes Management:  Are you having any financial strains with the device, your supplies or your medication? No .  Does the patient want to be seen by Chronic Care Management for management of their diabetes?  No  Would the patient like to be referred to a Nutritionist or for Diabetic Management?  No   Diabetic Exams:  Diabetic Eye Exam: Completed 06/01/2022 Diabetic Foot Exam: Completed 09/01/2022   Interpreter Needed?: No  Information entered by :: Lisette Abu, LPN.   Activities of Daily Living    10/01/2022    1:44 PM  In your present state of health, do you have any difficulty performing the following activities:  Hearing? 0  Vision? 0  Difficulty concentrating or making decisions? 0  Walking or climbing stairs? 0  Dressing or bathing? 0  Doing errands, shopping? 0  Preparing Food and eating ? N  Using the Toilet? N  In the past six months, have you accidently leaked urine? N  Do you have problems with loss of bowel control? N  Managing your Medications? N  Managing your Finances? N  Housekeeping or managing your Housekeeping? N    Patient Care Team: Binnie Rail, MD as PCP - General (Internal Medicine) Troy Sine, MD as PCP - Cardiology (Cardiology) Jola Schmidt, MD as Consulting Physician (Ophthalmology) Gavin Pound, MD as Consulting Physician (Rheumatology)  Indicate any recent Medical Services you may have received from other than [facility] providers in the past year (date may be approximate).     Assessment:   This is a routine wellness examination for [name] [name].  Hearing/Vision screen No results found.  Dietary issues and exercise activities discussed: Current Exercise Habits: Structured exercise class (aqua therapy class), Type of exercise: Other - see comments (water aeorbics), Time (Minutes): 60, Frequency (Times/Week): 2, Weekly Exercise (Minutes/Week): 120, Intensity: Moderate, Exercise limited by: cardiac  condition(s);orthopedic condition(s);psychological condition(s)   Goals Addressed   None   Depression Screen    10/01/2022    1:44 PM 09/17/2022    9:51 AM 07/28/2022    1:47 PM 07/15/2022    9:34 AM 03/12/2022    9:38 AM 09/16/2021    9:01 AM 09/16/2021    8:40 AM  PHQ 2/9 Scores  PHQ - 2 Score 0 0 0 0 0 0 0  PHQ- 9 Score 6 6 3 3       $ Fall Risk    10/01/2022    1:40 PM 09/17/2022    9:51 AM 07/28/2022    1:47 PM 07/15/2022    9:23 AM 03/12/2022    9:37 AM  [place] in the past year? 0 0 0 0 0  Number falls in past yr: 0 0 0 0 0  Injury with Fall? 0 0 0 0 0  Risk for fall due to : No Fall Risks No Fall Risks No Fall Risks No Fall Risks No Fall Risks  Follow up Falls prevention discussed Falls evaluation completed Falls evaluation completed Falls evaluation completed Falls evaluation completed    [place]:  Any stairs in or around the home? No  If so, are there any without handrails? No  Home free of loose throw rugs in walkways, pet beds, electrical cords, etc? Yes  Adequate lighting in your home to reduce risk of falls? Yes   ASSISTIVE DEVICES UTILIZED TO PREVENT FALLS:  Life alert?  No Use of a cane, walker or w/c? No  Grab bars in the bathroom? Yes  Shower chair or bench in shower? Yes  Elevated toilet seat or a handicapped toilet? Yes   TIMED UP AND GO:  Was the test performed? No . Telephonic Visit  Cognitive Function:        10/01/2022    1:43 PM 09/16/2021    9:46 AM  6CIT Screen  What Year? 0 points 0 points  What month? 0 points 0 points  What time? 0 points 0 points  Count back from 20 0 points 0 points  Months in reverse 0 points 0 points  Repeat phrase 0 points 0 points  Total Score 0 points 0 points    Immunizations Immunization History  Administered Date(s) Administered   Fluad Quad(high Dose 65+) 05/20/2020, 05/26/2021   Influenza Split 04/11/2018   Influenza, High Dose Seasonal PF 04/27/2016,  04/29/2017, 05/09/2019   Influenza,inj,Quad PF,6+ Mos 05/18/2014, 05/11/2019   Influenza,inj,quad, With Preservative 04/12/2017   Influenza-Unspecified 05/10/2014, 05/23/2022   Moderna Covid-19 Vaccine Bivalent Booster 29yr & up 04/22/2021, 12/01/2021, 05/10/2022   Moderna Sars-Covid-2 Vaccination 09/09/2019, 10/07/2019, 04/09/2020, 09/19/2020   Pneumococcal Conjugate-13 10/02/2014   Pneumococcal Polysaccharide-23 11/20/2010, 07/28/2018   Respiratory Syncytial Virus Vaccine,Recomb Aduvanted(Arexvy) 06/20/2022   Rsv, Mab, NKathie Rhodes 0.5 Ml, Neonate To 24 Mos(Beyfortus) 06/20/2022   Td 08/10/2008, 09/13/2018   Unspecified SARS-COV-2 Vaccination 10/10/2019, 09/19/2020   Zoster Recombinat (Shingrix) 03/12/2017, 02/17/2018    TDAP status: Up to date  Flu Vaccine status: Up to date  Pneumococcal vaccine status: Up to date  Covid-19 vaccine status: Completed vaccines  Qualifies for Shingles Vaccine? Yes   Zostavax completed No   Shingrix Completed?: Yes  Screening Tests Health Maintenance  Topic Date Due   COVID-19 Vaccine (10 - 2023-24 season) 10/03/2022 (Originally 07/05/2022)   Hepatitis C Screening  11/18/2068 (Originally 07/27/1964)   HEMOGLOBIN A1C  03/18/2023   OPHTHALMOLOGY EXAM  06/02/2023   FOOT EXAM  09/02/2023   Diabetic kidney evaluation - eGFR measurement  09/18/2023   Diabetic kidney evaluation - Urine ACR  09/18/2023   Medicare Annual Wellness (AWV)  10/02/2023   DEXA SCAN  05/05/2025   DTaP/Tdap/Td (3 - Tdap) 09/13/2028   Pneumonia Vaccine 77 Years old  Completed   INFLUENZA VACCINE  Completed   Zoster Vaccines- Shingrix  Completed   HPV VACCINES  Aged Out   COLONOSCOPY (Pts 45-457yrInsurance coverage will need to be confirmed)  Discontinued    Health Maintenance  There are no preventive care reminders to display for this patient.   Colorectal cancer screening: No longer required.   Mammogram status: Completed 07/2021 at OB/GYN office. Repeat  every year  Bone Density status: Completed 05/05/2022. Results reflect: Bone density results: OSTEOPENIA. Repeat every 3 years.  Lung Cancer Screening: (Low Dose CT Chest recommended if Age 78-80ears, 30 pack-year currently smoking OR have quit w/in 15years.) does not qualify.   Lung Cancer Screening Referral: no  Additional Screening:  Hepatitis C Screening: does qualify; Completed no  Vision Screening: Recommended annual ophthalmology exams for early detection of glaucoma and other disorders of the eye. Is the patient up to date with their annual eye exam?  Yes  Who is the provider or what is the name of the office in which the patient attends annual eye exams? BrJola SchmidtMD. If pt is not established with a provider, would they like to be referred to a provider to establish care? No .  Dental Screening: Recommended annual dental exams for proper oral hygiene  Community Resource Referral / Chronic Care Management: CRR required this visit?  No   CCM required this visit?  No      Plan:     I have personally reviewed and noted the following in the patient's chart:   Medical and social history Use of alcohol, tobacco or illicit drugs  Current medications and supplements including opioid prescriptions. Patient is currently taking opioid prescriptions. Information provided to patient regarding non-opioid alternatives. Patient advised to discuss non-opioid treatment plan with their provider. Functional ability and status Nutritional status Physical activity Advanced directives List of other physicians Hospitalizations, surgeries, and ER visits in previous 12 months Vitals Screenings to include cognitive, depression, and falls Referrals and appointments  In addition, I have reviewed and discussed with patient certain preventive protocols, quality metrics, and best practice recommendations. A written personalized care plan for preventive services as well as general  preventive health recommendations were provided to patient.     [name] Flow, LPN   [id number]   Nurse Notes: Normal cognitive status assessed by direct observation by this Nurse Health Advisor. No abnormalities found.

## 2022-10-01 NOTE — Telephone Encounter (Signed)
AWV-S completed 10/01/2022.  Haydon Dorris N. Tatum Massman, LPN. [place] Team Direct Dial: [phone]

## 2022-10-05 DIAGNOSIS — M6281 Muscle weakness (generalized): Secondary | ICD-10-CM | POA: Diagnosis not present

## 2022-10-05 DIAGNOSIS — R5381 Other malaise: Secondary | ICD-10-CM | POA: Diagnosis not present

## 2022-10-05 DIAGNOSIS — R2681 Unsteadiness on feet: Secondary | ICD-10-CM | POA: Diagnosis not present

## 2022-10-05 DIAGNOSIS — M545 Low back pain, unspecified: Secondary | ICD-10-CM | POA: Diagnosis not present

## 2022-10-05 DIAGNOSIS — G8929 Other chronic pain: Secondary | ICD-10-CM | POA: Diagnosis not present

## 2022-10-06 ENCOUNTER — Other Ambulatory Visit (HOSPITAL_COMMUNITY): Payer: Self-pay

## 2022-10-06 ENCOUNTER — Telehealth: Payer: Self-pay | Admitting: Pharmacy Technician

## 2022-10-06 NOTE — Telephone Encounter (Signed)
A Tier Exception for [employer] was requested by patient to patient plan [employer] administrator). The plan will faxed a determination.   Because there are formulary alternatives available in a lower tier, the plan has denied.

## 2022-10-07 ENCOUNTER — Other Ambulatory Visit: Payer: Self-pay

## 2022-10-07 DIAGNOSIS — G8929 Other chronic pain: Secondary | ICD-10-CM | POA: Diagnosis not present

## 2022-10-07 DIAGNOSIS — R5381 Other malaise: Secondary | ICD-10-CM | POA: Diagnosis not present

## 2022-10-07 DIAGNOSIS — R2681 Unsteadiness on feet: Secondary | ICD-10-CM | POA: Diagnosis not present

## 2022-10-07 DIAGNOSIS — M6281 Muscle weakness (generalized): Secondary | ICD-10-CM | POA: Diagnosis not present

## 2022-10-07 DIAGNOSIS — M545 Low back pain, unspecified: Secondary | ICD-10-CM | POA: Diagnosis not present

## 2022-10-07 NOTE — Telephone Encounter (Signed)
Called the patient. No answer. Left her a message with this information. Asked she confirm the pharmacy she wanted to use. Message through her patient portal of My Chart asking the same question.  Dr Lorenso Courier, I have changed the budesonide to Entocort and will send it through as soon as the pharmacy is confirmed.

## 2022-10-09 ENCOUNTER — Other Ambulatory Visit: Payer: Self-pay

## 2022-10-09 MED ORDER — DOCUSATE SODIUM 100 MG PO CAPS: ORAL_CAPSULE | ORAL | 2 refills | Status: DC

## 2022-10-12 ENCOUNTER — Other Ambulatory Visit: Payer: Self-pay

## 2022-10-12 DIAGNOSIS — G8929 Other chronic pain: Secondary | ICD-10-CM | POA: Diagnosis not present

## 2022-10-12 DIAGNOSIS — R5381 Other malaise: Secondary | ICD-10-CM | POA: Diagnosis not present

## 2022-10-12 DIAGNOSIS — R2681 Unsteadiness on feet: Secondary | ICD-10-CM | POA: Diagnosis not present

## 2022-10-12 DIAGNOSIS — M545 Low back pain, unspecified: Secondary | ICD-10-CM | POA: Diagnosis not present

## 2022-10-12 DIAGNOSIS — M6281 Muscle weakness (generalized): Secondary | ICD-10-CM | POA: Diagnosis not present

## 2022-10-12 MED ORDER — BUDESONIDE 3 MG PO CPEP: 9.0000 mg | ORAL_CAPSULE | Freq: Every day | ORAL | 2 refills | Status: DC

## 2022-10-14 ENCOUNTER — Ambulatory Visit (HOSPITAL_BASED_OUTPATIENT_CLINIC_OR_DEPARTMENT_OTHER): Payer: Medicare Other | Attending: Cardiovascular Disease | Admitting: Cardiovascular Disease

## 2022-10-14 ENCOUNTER — Encounter: Payer: Self-pay | Admitting: Internal Medicine

## 2022-10-14 VITALS — Ht 66.0 in | Wt 199.0 lb

## 2022-10-14 DIAGNOSIS — I493 Ventricular premature depolarization: Secondary | ICD-10-CM | POA: Diagnosis not present

## 2022-10-14 DIAGNOSIS — G4733 Obstructive sleep apnea (adult) (pediatric): Secondary | ICD-10-CM | POA: Insufficient documentation

## 2022-10-14 DIAGNOSIS — G4731 Primary central sleep apnea: Secondary | ICD-10-CM

## 2022-10-14 MED ORDER — LANTUS SOLOSTAR 100 UNIT/ML ~~LOC~~ SOPN: 20.0000 [IU] | PEN_INJECTOR | Freq: Two times a day (BID) | SUBCUTANEOUS | 5 refills | Status: DC

## 2022-10-15 DIAGNOSIS — M170 Bilateral primary osteoarthritis of knee: Secondary | ICD-10-CM | POA: Diagnosis not present

## 2022-10-17 NOTE — Procedures (Signed)
Patient Name: Sherrian, Kinley Date: 10/14/2022 Gender: Female D.O.B: 03/14/1946 Age (years): 75 Referring Provider: Shelva Majestic MD, ABSM Height (inches): 66 Interpreting Physician: Shelva Majestic MD, ABSM Weight (lbs): 199 RPSGT: Carolin Coy BMI: 32 MRN: [id number] Neck Size: 14.00  CLINICAL INFORMATION The patient is referred for a BiPAP titration to treat sleep apnea.  She has complex sleep apnea syndrome, did not tolerate CPAP and has been on BiPAP since prior to 2014.  SLEEP STUDY TECHNIQUE As per the AASM Manual for the Scoring of Sleep and Associated Events v2.3 (April 2016) with a hypopnea requiring 4% desaturations.  The channels recorded and monitored were frontal, central and occipital EEG, electrooculogram (EOG), submentalis EMG (chin), nasal and oral airflow, thoracic and abdominal wall motion, anterior tibialis EMG, snore microphone, electrocardiogram, and pulse oximetry. Bilevel positive airway pressure (BPAP) was initiated at the beginning of the study and titrated to treat sleep-disordered breathing.  MEDICATIONS acetaminophen (TYLENOL) 325 MG tablet Alcohol Swabs PADS allopurinol (ZYLOPRIM) 100 MG tablet ALPRAZolam (XANAX) 0.25 MG tablet augmented betamethasone dipropionate (DIPROLENE-AF) 0.05 % cream azelastine (ASTELIN) 0.1 % nasal spray Blood Glucose Monitoring Suppl (ACCU-CHEK GUIDE) w/Device KIT budesonide (ENTOCORT EC) 3 MG 24 hr capsule carvedilol (COREG) 123XX123 MG tablet Certolizumab Pegol (CIMZIA ) Cholecalciferol (VITAMIN D3) 1000 UNITS CAPS ciclopirox (LOPROX) 0.77 % cream clobetasol cream (TEMOVATE) 0.05 % Cyanocobalamin (VITAMIN B-12) 1000 MCG SUBL cyclobenzaprine (FLEXERIL) 10 MG tablet docusate sodium (COLACE) 123XX123 MG capsule folic acid (FOLVITE) 1 MG tablet glucose blood (ACCU-CHEK GUIDE) test strip glucose blood (ONETOUCH VERIO) test strip hydrochlorothiazide (MICROZIDE) 12.5 MG capsule (Expired) HYDROcodone-acetaminophen  (NORCO) 10-325 MG tablet insulin glargine (LANTUS SOLOSTAR) 100 UNIT/ML Solostar Pen Insulin Pen Needle (B-D UF III MINI PEN NEEDLES) 31G X 5 MM MISC lisinopril (ZESTRIL) 20 MG tablet loperamide (IMODIUM A-D) 2 MG tablet Melatonin 5 MG SUBL metFORMIN (GLUCOPHAGE-XR) 500 MG 24 hr tablet Methotrexate 2.5 MG/ML SOLN naftifine (NAFTIN) 1 % cream omeprazole (PRILOSEC) 40 MG capsule oxyCODONE-acetaminophen (PERCOCET) 10-325 MG tablet potassium chloride (KLOR-CON M) 10 MEQ tablet Probiotic Product (ALIGN PO) rosuvastatin (CRESTOR) 5 MG tablet sertraline (ZOLOFT) 100 MG tablet Medications self-administered by patient taken the night of the study : ALLOPURINOL, COREG, CRESTOR, FLEXERIL, FOLVITE, IMODIUM A-D, KLOR-CON, metformin xr, NORCO, PRILOSEC, XANAX  RESPIRATORY PARAMETERS Optimal IPAP Pressure (cm): 22 AHI at Optimal Pressure (/hr) 6.6 Optimal EPAP Pressure (cm): 18   Overall Minimal O2 (%): 85.0 Minimal O2 at Optimal Pressure (%): 93.0  SLEEP ARCHITECTURE Start Time: 10:52:06 PM Stop Time: 4:52:03 AM Total Time (min): 360 Total Sleep Time (min): 233 Sleep Latency (min): 9.4 Sleep Efficiency (%): 64.7% REM Latency (min): 108.0 WASO (min): 117.5 Stage N1 (%): 11.2% Stage N2 (%): 84.3% Stage N3 (%): 0.0% Stage R (%): 4.5 Supine (%): 80.04 Arousal Index (/hr): 16.2   CARDIAC DATA The 2 lead EKG demonstrated sinus rhythm. The mean heart rate was 72.3 beats per minute. Other EKG findings include: PVCs.  LEG MOVEMENT DATA The total Periodic Limb Movements of Sleep (PLMS) were 0. The PLMS index was 0.0. A PLMS index of <15 is considered normal in adults.  IMPRESSIONS - BiPAP was initiated at 8/4 and was titrated to an optimal PAP pressure at 22/18 cm of water (AHI 0, RDI 6.6/h, O2 nadir 93%). REM sleep did not occur beyond the 16/12 titration.  - Significant central sleep apnea was not noted during this titration (CAI 2.3/h). Central events were most prominent at 16/12 cm.  - Moderete  oxygen desaturations  were observed during this titration to a nadir of 85.0%. - The patient snored with soft snoring volume. - 2-lead EKG demonstrated: PVCs - Clinically significant periodic limb movements were not noted during this study. Arousals associated with PLMs were rare.  DIAGNOSIS - Obstructive Sleep Apnea (G47.33)  RECOMMENDATIONS - Recommend an initial trial of BiPAP Auto therapy with EPAP min of 18, PS of 4 and IPAP max or 25 cm H2O with heated humidification.  A Medium size Resmed Full Face AirFit F30i mask was used for the titration. - Effort should be made to optimize nasal and oropharyngeal patency. - Avoid alcohol, sedatives and other CNS depressants that may worsen sleep apnea and disrupt normal sleep architecture. - Sleep hygiene should be reviewed to assess factors that may improve sleep quality. - Weight management and regular exercise should be initiated or continued. - Recommend a download and sleep clinic evaluation after 4 weeks of therapy.  [Electronically signed] 10/17/2022 01:43 PM  Shelva Majestic MD, [facility], ABSM Diplomate, American Board of Sleep Medicine  NPI: [id number]  [place] PH: [phone]   FX: [phone] [place]

## 2022-10-19 DIAGNOSIS — R5381 Other malaise: Secondary | ICD-10-CM | POA: Diagnosis not present

## 2022-10-19 DIAGNOSIS — G8929 Other chronic pain: Secondary | ICD-10-CM | POA: Diagnosis not present

## 2022-10-19 DIAGNOSIS — M6281 Muscle weakness (generalized): Secondary | ICD-10-CM | POA: Diagnosis not present

## 2022-10-19 DIAGNOSIS — M545 Low back pain, unspecified: Secondary | ICD-10-CM | POA: Diagnosis not present

## 2022-10-19 DIAGNOSIS — R2681 Unsteadiness on feet: Secondary | ICD-10-CM | POA: Diagnosis not present

## 2022-10-21 ENCOUNTER — Emergency Department (HOSPITAL_COMMUNITY): Payer: Medicare Other

## 2022-10-21 ENCOUNTER — Other Ambulatory Visit: Payer: Self-pay

## 2022-10-21 ENCOUNTER — Emergency Department (HOSPITAL_COMMUNITY)
Admission: EM | Admit: 2022-10-21 | Discharge: 2022-10-21 | Disposition: A | Discharge status: Home / Self Care | Payer: Medicare Other | Attending: Emergency Medicine | Admitting: Emergency Medicine

## 2022-10-21 DIAGNOSIS — Z7984 Long term (current) use of oral hypoglycemic drugs: Secondary | ICD-10-CM | POA: Diagnosis not present

## 2022-10-21 DIAGNOSIS — Z794 Long term (current) use of insulin: Secondary | ICD-10-CM | POA: Insufficient documentation

## 2022-10-21 DIAGNOSIS — R0789 Other chest pain: Secondary | ICD-10-CM | POA: Diagnosis not present

## 2022-10-21 DIAGNOSIS — Z20822 Contact with and (suspected) exposure to covid-19: Secondary | ICD-10-CM | POA: Diagnosis not present

## 2022-10-21 DIAGNOSIS — R079 Chest pain, unspecified: Secondary | ICD-10-CM

## 2022-10-21 DIAGNOSIS — E119 Type 2 diabetes mellitus without complications: Secondary | ICD-10-CM | POA: Diagnosis not present

## 2022-10-21 DIAGNOSIS — K2970 Gastritis, unspecified, without bleeding: Secondary | ICD-10-CM | POA: Diagnosis not present

## 2022-10-21 DIAGNOSIS — Z79899 Other long term (current) drug therapy: Secondary | ICD-10-CM | POA: Diagnosis not present

## 2022-10-21 DIAGNOSIS — R072 Precordial pain: Secondary | ICD-10-CM | POA: Diagnosis present

## 2022-10-21 DIAGNOSIS — R42 Dizziness and giddiness: Secondary | ICD-10-CM | POA: Diagnosis not present

## 2022-10-21 DIAGNOSIS — I700 Atherosclerosis of aorta: Secondary | ICD-10-CM | POA: Diagnosis not present

## 2022-10-21 DIAGNOSIS — I10 Essential (primary) hypertension: Secondary | ICD-10-CM | POA: Diagnosis not present

## 2022-10-21 DIAGNOSIS — R1110 Vomiting, unspecified: Secondary | ICD-10-CM | POA: Diagnosis not present

## 2022-10-21 LAB — COMPREHENSIVE METABOLIC PANEL
ALT: 21 U/L (ref 0–44)
AST: 31 U/L (ref 15–41)
Albumin: 3.8 g/dL (ref 3.5–5.0)
Alkaline Phosphatase: 64 U/L (ref 38–126)
Anion gap: 7 (ref 5–15)
BUN: 17 mg/dL (ref 8–23)
CO2: 28 mmol/L (ref 22–32)
Calcium: 9.5 mg/dL (ref 8.9–10.3)
Chloride: 99 mmol/L (ref 98–111)
Creatinine, Ser: 0.82 mg/dL (ref 0.44–1.00)
GFR, Estimated: >60 mL/min (ref >60)
Glucose, Bld: 110 mg/dL — ABNORMAL HIGH (ref 70–99)
Potassium: 4.7 mmol/L (ref 3.5–5.1)
Sodium: 134 mmol/L — ABNORMAL LOW (ref 135–145)
Total Bilirubin: 0.5 mg/dL (ref 0.3–1.2)
Total Protein: 7 g/dL (ref 6.5–8.1)

## 2022-10-21 LAB — CBC
HCT: 35.2 % — ABNORMAL LOW (ref 36.0–46.0)
Hemoglobin: 11.2 g/dL — ABNORMAL LOW (ref 12.0–15.0)
MCH: 29.7 pg (ref 26.0–34.0)
MCHC: 31.8 g/dL (ref 30.0–36.0)
MCV: 93.4 fL (ref 80.0–100.0)
Platelets: 214 10*3/uL (ref 150–400)
RBC: 3.77 MIL/uL — ABNORMAL LOW (ref 3.87–5.11)
RDW: 18.5 % — ABNORMAL HIGH (ref 11.5–15.5)
WBC: 12.2 10*3/uL — ABNORMAL HIGH (ref 4.0–10.5)
nRBC: 0 % (ref 0.0–0.2)

## 2022-10-21 LAB — TROPONIN I (HIGH SENSITIVITY)
Troponin I (High Sensitivity): 5 ng/L (ref <18)
Troponin I (High Sensitivity): 4 ng/L (ref <18)

## 2022-10-21 LAB — URINALYSIS, ROUTINE W REFLEX MICROSCOPIC
Bilirubin Urine: NEGATIVE
Glucose, UA: NEGATIVE mg/dL
Hgb urine dipstick: NEGATIVE
Ketones, ur: NEGATIVE mg/dL
Leukocytes,Ua: NEGATIVE
Nitrite: NEGATIVE
Protein, ur: NEGATIVE mg/dL
Specific Gravity, Urine: 1.011 (ref 1.005–1.030)
pH: 6 (ref 5.0–8.0)

## 2022-10-21 LAB — LIPASE, BLOOD: Lipase: 26 U/L (ref 11–51)

## 2022-10-21 LAB — RESP PANEL BY RT-PCR (RSV, FLU A&B, COVID)  RVPGX2
Influenza A by PCR: NEGATIVE
Influenza B by PCR: NEGATIVE
Resp Syncytial Virus by PCR: NEGATIVE
SARS Coronavirus 2 by RT PCR: NEGATIVE

## 2022-10-21 LAB — CBG MONITORING, ED: Glucose-Capillary: 106 mg/dL — ABNORMAL HIGH (ref 70–99)

## 2022-10-21 MED ORDER — FAMOTIDINE 20 MG PO TABS: 20.0000 mg | ORAL_TABLET | Freq: Two times a day (BID) | ORAL | 0 refills | Status: DC

## 2022-10-21 MED ORDER — ONDANSETRON HCL 4 MG/2ML IJ SOLN
4.0000 mg | Freq: Once | INTRAMUSCULAR | Status: AC
  Administered 2022-10-21: 4 mg via INTRAVENOUS
  Filled 2022-10-21: qty 2

## 2022-10-21 MED ORDER — MAALOX MAX 400-400-40 MG/5ML PO SUSP: 10.0000 mL | Freq: Four times a day (QID) | ORAL | 0 refills | Status: DC | PRN

## 2022-10-21 MED ORDER — ONDANSETRON 4 MG PO TBDP: 4.0000 mg | ORAL_TABLET | Freq: Three times a day (TID) | ORAL | 0 refills | Status: DC | PRN

## 2022-10-21 MED ORDER — SODIUM CHLORIDE 0.9 % IV BOLUS (SEPSIS)
1000.0000 mL | Freq: Once | INTRAVENOUS | Status: AC
  Administered 2022-10-21: 1000 mL via INTRAVENOUS

## 2022-10-21 MED ORDER — PANTOPRAZOLE SODIUM 40 MG IV SOLR
40.0000 mg | Freq: Once | INTRAVENOUS | Status: AC
  Administered 2022-10-21: 40 mg via INTRAVENOUS
  Filled 2022-10-21: qty 10

## 2022-10-21 MED ORDER — ALUM & MAG HYDROXIDE-SIMETH 200-200-20 MG/5ML PO SUSP
30.0000 mL | Freq: Once | ORAL | Status: AC
  Administered 2022-10-21: 30 mL via ORAL
  Filled 2022-10-21: qty 30

## 2022-10-21 MED ORDER — IOHEXOL 300 MG/ML  SOLN
100.0000 mL | Freq: Once | INTRAMUSCULAR | Status: AC | PRN
  Administered 2022-10-21: 100 mL via INTRAVENOUS

## 2022-10-21 NOTE — Discharge Instructions (Signed)
You were seen in the emergency department today for chest pain. You workup as we discussed was generally reassuring.  Your CT scan did show some inflammation of your stomach consistent with gastritis.  Continue your Prilosec first thing in the morning as well as the medications we have prescribed today to help with symptom control.  Make sure to make an appointment with your cardiologist and gastroenterologist regarding your visit to the ER today.  You may need further workup outpatient.  Return to the emergency department immediately if you develop recurrent, severe chest pain, shortness of breath, fainting spells, sudden sweatiness, or any other concerning symptoms.   Please also make an appointment to follow up with your primary care doctor or cardiologist within one week to assure improvement or resolution in symptoms. Further testing may be necessary, so it is extremely important to keep your follow-up appointment with your primary doctor.

## 2022-10-21 NOTE — ED Provider Triage Note (Signed)
Emergency Medicine Provider Triage Evaluation Note  [name] [name] , a 77 y.o. female  was evaluated in triage.  Pt complains of concerns for sternal chest pain onset this morning.  Notes chest pain lasted for approximately 1 hour and describes it as a pressure sensation.  She notes that she took 2 of her husbands nitroglycerin that did alleviate some of her symptoms however she still has chest pressure currently.  Patient has associated nausea, emesis, suprapubic abdominal pain.  Denies diaphoresis, urinary symptoms.  Denies past medical history of MI, hypertension, stents.  Has a history of diabetes and compliant with her medications.  Review of Systems  Positive:  Negative:   Physical Exam  BP (!) 130/59 (BP Location: Left Arm)   Pulse 84   Temp 98.5 F (36.9 C) (Oral)   Resp 20   Ht '5\' 6"'$  (1.676 m)   Wt 90.3 kg   SpO2 98%   BMI 32.12 kg/m  Gen:   Awake, no distress   Resp:  Normal effort  MSK:   Moves extremities without difficulty  Other:  No chest wall tenderness to palpation.  Mild tenderness to palpation noted to suprapubic abdominal region.  Medical Decision Making  Medically screening exam initiated at 3:05 PM.  Appropriate orders placed.  GENISHA LAZUR was informed that the remainder of the evaluation will be completed by another provider, this initial triage assessment does not replace that evaluation, and the importance of remaining in the ED until their evaluation is complete.  3:06 PM - Discussed with RN that patient is in need of a room immediately. RN aware and working on room placement.    Jenia Klepper A, PA-C 10/21/22 [phone]

## 2022-10-21 NOTE — ED Provider Notes (Signed)
[facility] AT [facility] Provider Note   CSN: [id number] Arrival date & time: 10/21/22  1445     History  Chief Complaint  Patient presents with   Chest Pain   Nausea    [name] [name] is a 77 y.o. female.  With PMH of DM, HTN, GERD, OSA, history of DVT who presents after episode of substernal upper abdominal discomfort associated with vomiting earlier today.  Patient woke up and was getting ready for swim class when she felt lightheaded and dizzy and generally unwell.  She developed some discomfort in her epigastrium and substernal region pressure-like associated with nausea and multiple episodes of nonbloody nonbilious emesis.  She then further developed substernal chest pain and a lot of reflux.  She took some reflux medicine as well as 2 nitroglycerin from her husband and eventually symptoms subsided.  There has been no diaphoresis, no SOB, no coughing, no diarrhea, no fevers or chills.  She has been feeling " lousy" for the past couple of days.  No history of MI or CVA.  Symptoms have pretty much subsided now.   Chest Pain      Home Medications Prior to Admission medications   Medication Sig Start Date End Date Taking? Authorizing Provider  alum & mag hydroxide-simeth (MAALOX MAX) 400-400-40 MG/5ML suspension Take 10 mLs by mouth every 6 (six) hours as needed for indigestion. 10/21/22  Yes Elgie Congo, MD  famotidine (PEPCID) 20 MG tablet Take 1 tablet (20 mg total) by mouth 2 (two) times daily. 10/21/22  Yes Elgie Congo, MD  ondansetron (ZOFRAN-ODT) 4 MG disintegrating tablet Take 1 tablet (4 mg total) by mouth every 8 (eight) hours as needed for nausea or vomiting. 10/21/22  Yes Elgie Congo, MD  Accu-Chek Softclix Lancets lancets Use as instructed to check sugars 2-3 times daily.  E11.42 04/27/21   Binnie Rail, MD  acetaminophen (TYLENOL) 325 MG tablet Take 2 tablets (650 mg total) by mouth every 6 (six) hours as needed for  mild pain or headache. 07/13/19   Elgergawy, Silver Huguenin, MD  Alcohol Swabs PADS UAD to check sugars.  E11.65 12/18/20   Binnie Rail, MD  allopurinol (ZYLOPRIM) 100 MG tablet TAKE 1 TABLET(100 MG) BY MOUTH TWICE DAILY 04/14/22   Burns, Claudina Lick, MD  ALPRAZolam (XANAX) 0.25 MG tablet TAKE 1 TABLET BY MOUTH THREE TIMES A DAY AS NEEDED FOR ANXIETY 09/30/22   Binnie Rail, MD  augmented betamethasone dipropionate (DIPROLENE-AF) 0.05 % cream     [provider]  azelastine (ASTELIN) 0.1 % nasal spray INSTILL 2 SPRAYS IN EACH NOSTRIL TWICE DAILY 05/19/22   Binnie Rail, MD  Blood Glucose Monitoring Suppl (ACCU-CHEK GUIDE) w/Device KIT AS DIRECTED 04/21/21   Binnie Rail, MD  budesonide (ENTOCORT EC) 3 MG 24 hr capsule Take 3 capsules (9 mg total) by mouth daily. 10/12/22   Sharyn Creamer, MD  carvedilol (COREG) 6.25 MG tablet TAKE 1 TABLET BY MOUTH TWICE A DAY WITH A MEAL 09/29/22   Burns, Claudina Lick, MD  Certolizumab Pegol (CIMZIA Daisy) Inject 400 mg into the skin every 30 (thirty) days. 200 mg on each side of stomach    [provider]  Cholecalciferol (VITAMIN D3) 1000 UNITS CAPS Take 1,000 Units by mouth 2 (two) times daily.     [provider]  ciclopirox (LOPROX) 0.77 % cream Apply topically. 12/18/21   [provider]  clobetasol cream (TEMOVATE) 0.05 %  Apply 1 application. topically 2 (two) times daily. 12/17/21   [provider]  Cyanocobalamin (VITAMIN B-12) 1000 MCG SUBL Place 1,000 mcg under the tongue daily.    [provider]  cyclobenzaprine (FLEXERIL) 10 MG tablet TAKE 1 TABLET BY MOUTH AT BEDTIME 09/07/22   Burns, Claudina Lick, MD  docusate sodium (COLACE) 100 MG capsule TAKE 1 CAPSULE(100 MG) BY MOUTH TWICE DAILY 10/09/22   Burns, Claudina Lick, MD  folic acid (FOLVITE) 1 MG tablet TAKE 1 TABLET(1 MG) BY MOUTH DAILY 04/14/22   Burns, Claudina Lick, MD  glucose blood (ACCU-CHEK GUIDE) test strip USE AS INSTRUCTED TO CHECK SUGARS 2-3 TIMES DAILY 06/16/22   Burns, Claudina Lick, MD  glucose blood (ONETOUCH VERIO) test strip one strip (1 each dose) daily. 12/28/20   [provider]  hydrochlorothiazide (MICROZIDE) 12.5 MG capsule Take 1 capsule (12.5 mg total) by mouth as needed (for swelling). 05/18/22 08/16/22  Troy Sine, MD  HYDROcodone-acetaminophen (NORCO) 10-325 MG tablet Take 1 tablet by mouth 3 (three) times daily as needed. 01/06/22   [provider]  insulin glargine (LANTUS SOLOSTAR) 100 UNIT/ML Solostar Pen Inject 20 Units into the skin 2 (two) times daily. 10/14/22   Binnie Rail, MD  Insulin Pen Needle (B-D UF III MINI PEN NEEDLES) 31G X 5 MM MISC Use daily for insulin pen 02/27/22   Binnie Rail, MD  lisinopril (ZESTRIL) 20 MG tablet Take 1 tablet (20 mg total) by mouth daily. 10/20/21   Troy Sine, MD  loperamide (IMODIUM A-D) 2 MG tablet Take 1 tablet (2 mg total) by mouth 4 (four) times daily as needed for diarrhea or loose stools. 06/05/22   Sharyn Creamer, MD  Melatonin 5 MG SUBL Place 5 mg under the tongue at bedtime.    [provider]  metFORMIN (GLUCOPHAGE-XR) 500 MG 24 hr tablet TAKE 1 TABLET BY MOUTH TWICE A DAY 07/24/22   Burns, Claudina Lick, MD  Methotrexate 2.5 MG/ML SOLN TAKE 8 TABLETSALL AT THE SAME TIME Orally ONCE WEEKLY for 90 days    [provider]  naftifine (NAFTIN) 1 % cream Apply 1 application. topically daily as needed (rash). 06/27/20   [provider]  omeprazole (PRILOSEC) 40 MG capsule Take 1 capsule (40 mg total) by mouth daily. 09/25/22   Binnie Rail, MD  oxyCODONE-acetaminophen (PERCOCET) 10-325 MG tablet  09/14/22   [provider]  potassium chloride (KLOR-CON M) 10 MEQ tablet Take 2 tablets (20 mEq total) by mouth daily. 09/18/22   Binnie Rail, MD  Probiotic Product (ALIGN PO) Take 1 capsule by mouth daily.    [provider]  rosuvastatin (CRESTOR) 5 MG tablet TAKE 1 TABLET(5 MG) BY MOUTH DAILY 03/19/22   Binnie Rail, MD  sertraline (ZOLOFT) 100 MG tablet  Take 1.5 tablets (150 mg total) by mouth daily. 09/21/22   Binnie Rail, MD      Allergies    Other, Thorazine [chlorpromazine], Bee venom, Farxiga [dapagliflozin], Semaglutide, and Sulfa antibiotics    Review of Systems   Review of Systems  Cardiovascular:  Positive for chest pain.    Physical Exam Updated Vital Signs BP 119/82   Pulse 81   Temp 98.5 F (36.9 C) (Oral)   Resp 17   Ht '5\' 6"'$  (1.676 m)   Wt 90.3 kg   SpO2 99%   BMI 32.12 kg/m  Physical Exam Constitutional: Alert and oriented. Nontoxic, pleasant female NAD Eyes: Conjunctivae are  normal. ENT      Head: Normocephalic and atraumatic. Cardiovascular: S1, S2, regular rate, Normal and symmetric distal pulses are present in all extremities.Warm and well perfused. Respiratory: Normal respiratory effort. Breath sounds are normal. O2 sat 98 on RA Gastrointestinal: Soft and epigastrium and suprapubic ttp, no rebound or guarding Musculoskeletal: Normal range of motion in all extremities.      Right lower leg: No tenderness or edema.      Left lower leg: No tenderness or edema. Neurologic: Normal speech and language.  No facial droop.  Moving all extremities equally.  Sensation grossly intact.  No gross focal neurologic deficits are appreciated. Skin: Skin is warm, dry and intact. No rash noted. Psychiatric: Mood and affect are normal. Speech and behavior are normal.  ED Results / Procedures / Treatments   Labs (all labs ordered are listed, but only abnormal results are displayed) Labs Reviewed  CBC - Abnormal; Notable for the following components:      Result Value   WBC 12.2 (*)    RBC 3.77 (*)    Hemoglobin 11.2 (*)    HCT 35.2 (*)    RDW 18.5 (*)    All other components within normal limits  COMPREHENSIVE METABOLIC PANEL - Abnormal; Notable for the following components:   Sodium 134 (*)    Glucose, Bld 110 (*)    All other components within normal limits  CBG MONITORING, ED - Abnormal; Notable for the  following components:   Glucose-Capillary 106 (*)    All other components within normal limits  RESP PANEL BY RT-PCR (RSV, FLU A&B, COVID)  [id number]  URINALYSIS, ROUTINE W REFLEX MICROSCOPIC  LIPASE, BLOOD  CBG MONITORING, ED  TROPONIN I (HIGH SENSITIVITY)  TROPONIN I (HIGH SENSITIVITY)    EKG EKG Interpretation  Date/Time:  Wednesday October 21 2022 15:00:44 EDT Ventricular Rate:  81 PR Interval:  155 QRS Duration: 131 QT Interval:  347 QTC Calculation: 403 R Axis:   23 Text Interpretation: Sinus rhythm Multiple ventricular premature complexes Right bundle branch block Confirmed by Georgina Snell [phone]) on 10/21/2022 4:24:47 PM  Radiology CT ABDOMEN PELVIS W CONTRAST  Result Date: 10/21/2022 CLINICAL DATA:  Abdominal discomfort, vomiting, nausea EXAM: CT ABDOMEN AND PELVIS WITH CONTRAST TECHNIQUE: Multidetector CT imaging of the abdomen and pelvis was performed using the standard protocol following bolus administration of intravenous contrast. RADIATION DOSE REDUCTION: This exam was performed according to the departmental dose-optimization program which includes automated exposure control, adjustment of the mA and/or kV according to patient size and/or use of iterative reconstruction technique. CONTRAST:  163m OMNIPAQUE IOHEXOL 300 MG/ML  SOLN COMPARISON:  07/12/2020 FINDINGS: Lower chest: No acute abnormality. Hepatobiliary: No solid liver abnormality is seen. No gallstones, gallbladder wall thickening, or biliary dilatation. Pancreas: Diffusely atrophic appearance of the pancreas. No pancreatic ductal dilatation or surrounding inflammatory changes. Spleen: Normal in size without significant abnormality. Adrenals/Urinary Tract: Adrenal glands are unremarkable. Kidneys are normal, without renal calculi, solid lesion, or hydronephrosis. Bladder is unremarkable. Stomach/Bowel: Somewhat thickened appearing gastric mucosa (series 2, image 24). Appendix appears normal. No evidence of bowel wall  thickening, distention, or inflammatory changes. Vascular/Lymphatic: Aortic atherosclerosis. No enlarged abdominal or pelvic lymph nodes. Reproductive: No mass or other significant abnormality. Other: No abdominal wall hernia or abnormality. No ascites. Musculoskeletal: No acute or significant osseous findings. Status post right hip total arthroplasty. IMPRESSION: 1. No definite acute CT findings of the abdomen or pelvis to explain abdominal discomfort or vomiting. No evidence of bowel obstruction.  2. 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 3. Diffusely atrophic appearance of the pancreas. No pancreatic ductal dilatation or surrounding inflammatory changes. Aortic Atherosclerosis (ICD10-I70.0). Electronically Signed   By: Delanna Ahmadi M.D.   On: 10/21/2022 19:08   DG Chest 2 View  Result Date: 10/21/2022 CLINICAL DATA:  Chest pain. EXAM: CHEST - 2 VIEW COMPARISON:  Chest radiograph 07/12/2020. CT abdomen/pelvis 07/12/2020. FINDINGS: Clear lungs. Normal heart size and mediastinal contours. No pleural effusion or pneumothorax. Unchanged chronic anterior compression deformity of the T12 vertebral body. IMPRESSION: No evidence of acute cardiopulmonary disease. Electronically Signed   By: Emmit Alexanders M.D.   On: 10/21/2022 15:27    Procedures Procedures  Remain on constant cardiac monitoring, sinus rhythm with intermittent PVCs.  Medications Ordered in ED Medications  sodium chloride 0.9 % bolus 1,000 mL (0 mLs Intravenous Stopped 10/21/22 1933)  alum & mag hydroxide-simeth (MAALOX/MYLANTA) 200-200-20 MG/5ML suspension 30 mL (30 mLs Oral Given 10/21/22 1704)  pantoprazole (PROTONIX) injection 40 mg (40 mg Intravenous Given 10/21/22 1704)  ondansetron (ZOFRAN) injection 4 mg (4 mg Intravenous Given 10/21/22 1704)  iohexol (OMNIPAQUE) 300 MG/ML solution 100 mL (100 mLs Intravenous  Contrast Given 10/21/22 1838)    ED Course/ Medical Decision Making/ A&P Clinical Course as of 10/21/22 2003  Wed Oct 21, 2022  1944 Patient reassessed no further chest pain, symptoms have improved with medicine here.  Her CT was unremarkable for any acute findings but did show inflammation of her stomach consistent with gastritis which is consistent with her symptoms today.  She has a GI doctor and cardiologist whom she can both follow-up with regarding her symptoms today.  Advised continued home omeprazole and prescribed further gastritis meds.  Strict return precaution discussed.  Patient and patient's husband in agreement with plan. [VB]    Clinical Course User Index [VB] Elgie Congo, MD                             Medical Decision Making  MONISE ASATO is a 77 y.o. female.  With PMH of DM, HTN, GERD, OSA, history of DVT who presents after episode of substernal upper abdominal discomfort associated with vomiting earlier today.  Regarding patient's symptoms today which have since resolved since this morning, suspect atypical chest pain and more consistent with GI source such as enteritis or GERD or PUD.   Her HEART score is 4 and overall have an EKG which is reassuring and has no acute ST/T changes new from prior. Will further evaluate for ACS with high-sensitivity troponin at least 3 hours from the patient's start of pain which were reassuring, initial hs trop 5, repeat 4. Doubt ACS or cardiac etiology with reassuring troponins and EKG.  Chest x-ray obtained which I personally reviewed which showed no evidence for pneumonia, pneumothorax, and pulmonary edema.  I do not think aortic dissection as the patient is well-appearing, does not have ripping/tearing pain and equally has no pulse or neurologic deficits. They have had no recent instrumentation such as endoscopy, have no crepitus of the chest and are overall well appearing making Booerhave's unlikely.  She had normal lipase and no  transaminitis doubt acute pancreatitis.  CTAP with IV contrast obtained which I personally reviewed showing thickened appearance of gastric mucosa suggestive of gastritis or mucosal hypertrophy.  She does have GI doctor and cardiologist whom she  can follow-up with regarding her visit to the ER today.  Her pain resolved with medications here.  She has had no further episodes.  I have discharged her with Pepcid and Maalox and advised continued daily omeprazole.  Return precautions discussed.  Patient and patient's husband in agreement with this plan and safe for discharge.  She was discharged in good condition.   Amount and/or Complexity of Data Reviewed Labs: ordered. Radiology: ordered.  Risk OTC drugs. Prescription drug management.    Final Clinical Impression(s) / ED Diagnoses Final diagnoses:  Chest pain, unspecified type  Gastritis without bleeding, unspecified chronicity, unspecified gastritis type    Rx / DC Orders ED Discharge Orders          Ordered    famotidine (PEPCID) 20 MG tablet  2 times daily        10/21/22 1951    alum & mag hydroxide-simeth (MAALOX MAX) [id number] MG/5ML suspension  Every 6 hours PRN        10/21/22 1951    ondansetron (ZOFRAN-ODT) 4 MG disintegrating tablet  Every 8 hours PRN        10/21/22 1951              Elgie Congo, MD 10/21/22 2003

## 2022-10-21 NOTE — ED Triage Notes (Signed)
Pt arrived via POV. Pt states they began feeling dizzy at 0800 today, followed by nausea and chest pain.   AOx4

## 2022-10-22 ENCOUNTER — Encounter: Payer: Self-pay | Admitting: Internal Medicine

## 2022-10-22 DIAGNOSIS — M170 Bilateral primary osteoarthritis of knee: Secondary | ICD-10-CM | POA: Diagnosis not present

## 2022-10-23 DIAGNOSIS — Z79899 Other long term (current) drug therapy: Secondary | ICD-10-CM | POA: Diagnosis not present

## 2022-10-23 DIAGNOSIS — R5383 Other fatigue: Secondary | ICD-10-CM | POA: Diagnosis not present

## 2022-10-23 DIAGNOSIS — Z111 Encounter for screening for respiratory tuberculosis: Secondary | ICD-10-CM | POA: Diagnosis not present

## 2022-10-23 DIAGNOSIS — L4059 Other psoriatic arthropathy: Secondary | ICD-10-CM | POA: Diagnosis not present

## 2022-10-26 ENCOUNTER — Telehealth: Payer: Self-pay | Admitting: *Deleted

## 2022-10-26 NOTE — Telephone Encounter (Signed)
BIPAP order sent to [facility] via [facility] portal.

## 2022-10-28 DIAGNOSIS — L570 Actinic keratosis: Secondary | ICD-10-CM | POA: Diagnosis not present

## 2022-10-28 DIAGNOSIS — L814 Other melanin hyperpigmentation: Secondary | ICD-10-CM | POA: Diagnosis not present

## 2022-10-28 DIAGNOSIS — D692 Other nonthrombocytopenic purpura: Secondary | ICD-10-CM | POA: Diagnosis not present

## 2022-10-28 DIAGNOSIS — L821 Other seborrheic keratosis: Secondary | ICD-10-CM | POA: Diagnosis not present

## 2022-10-28 DIAGNOSIS — D1801 Hemangioma of skin and subcutaneous tissue: Secondary | ICD-10-CM | POA: Diagnosis not present

## 2022-10-28 DIAGNOSIS — L720 Epidermal cyst: Secondary | ICD-10-CM | POA: Diagnosis not present

## 2022-10-28 DIAGNOSIS — B078 Other viral warts: Secondary | ICD-10-CM | POA: Diagnosis not present

## 2022-10-28 DIAGNOSIS — L718 Other rosacea: Secondary | ICD-10-CM | POA: Diagnosis not present

## 2022-10-28 DIAGNOSIS — L820 Inflamed seborrheic keratosis: Secondary | ICD-10-CM | POA: Diagnosis not present

## 2022-10-28 DIAGNOSIS — L111 Transient acantholytic dermatosis [Grover]: Secondary | ICD-10-CM | POA: Diagnosis not present

## 2022-10-29 ENCOUNTER — Encounter: Payer: Self-pay | Admitting: Internal Medicine

## 2022-10-29 ENCOUNTER — Other Ambulatory Visit (INDEPENDENT_AMBULATORY_CARE_PROVIDER_SITE_OTHER): Payer: Medicare Other

## 2022-10-29 ENCOUNTER — Ambulatory Visit (INDEPENDENT_AMBULATORY_CARE_PROVIDER_SITE_OTHER): Payer: Medicare Other | Admitting: Internal Medicine

## 2022-10-29 VITALS — BP 122/68 | HR 97 | Ht 66.0 in

## 2022-10-29 DIAGNOSIS — R1013 Epigastric pain: Secondary | ICD-10-CM | POA: Diagnosis not present

## 2022-10-29 DIAGNOSIS — R112 Nausea with vomiting, unspecified: Secondary | ICD-10-CM

## 2022-10-29 DIAGNOSIS — K222 Esophageal obstruction: Secondary | ICD-10-CM

## 2022-10-29 DIAGNOSIS — R1310 Dysphagia, unspecified: Secondary | ICD-10-CM

## 2022-10-29 DIAGNOSIS — R5383 Other fatigue: Secondary | ICD-10-CM

## 2022-10-29 DIAGNOSIS — K52839 Microscopic colitis, unspecified: Secondary | ICD-10-CM | POA: Diagnosis not present

## 2022-10-29 DIAGNOSIS — Z6838 Body mass index (BMI) 38.0-38.9, adult: Secondary | ICD-10-CM

## 2022-10-29 DIAGNOSIS — K3189 Other diseases of stomach and duodenum: Secondary | ICD-10-CM

## 2022-10-29 DIAGNOSIS — K219 Gastro-esophageal reflux disease without esophagitis: Secondary | ICD-10-CM

## 2022-10-29 LAB — FOLATE: Folate: >23.8 ng/mL (ref >5.9)

## 2022-10-29 LAB — IBC + FERRITIN
Ferritin: 24.7 ng/mL (ref 10.0–291.0)
Iron: 155 ug/dL — ABNORMAL HIGH (ref 42–145)
Saturation Ratios: 43.2 % (ref 20.0–50.0)
TIBC: 358.4 ug/dL (ref 250.0–450.0)
Transferrin: 256 mg/dL (ref 212.0–360.0)

## 2022-10-29 LAB — VITAMIN B12: Vitamin B-12: 1201 pg/mL — ABNORMAL HIGH (ref 211–911)

## 2022-10-29 LAB — VITAMIN D 25 HYDROXY (VIT D DEFICIENCY, FRACTURES): VITD: 43.31 ng/mL (ref 30.00–100.00)

## 2022-10-29 MED ORDER — OMEPRAZOLE 40 MG PO CPDR: 40.0000 mg | DELAYED_RELEASE_CAPSULE | Freq: Two times a day (BID) | ORAL | 2 refills | Status: DC

## 2022-10-29 MED ORDER — ONDANSETRON 4 MG PO TBDP: 4.0000 mg | ORAL_TABLET | Freq: Three times a day (TID) | ORAL | 3 refills | Status: DC | PRN

## 2022-10-29 NOTE — Patient Instructions (Signed)
Your provider has requested that you go to the basement level for lab work before leaving today. Press "B" on the elevator. The lab is located at the first door on the left as you exit the elevator.   You have been scheduled for a gastric emptying scan at [facility] on 12/07/22 at 7:30 am. Please arrive at least 30 minutes prior to your appointment for registration. Please make certain not to have anything to eat or drink 6 hours prior your test. Hold all stomach medications (ex: Zofran, phenergan, Reglan) 24 hours prior to your test. If you need to reschedule your appointment, please contact radiology scheduling at [phone]. _____________________________________________________________________ A gastric-emptying study measures how long it takes for food to move through your stomach. There are several ways to measure stomach emptying. In the most common test, you eat food that contains a small amount of radioactive material. A scanner that detects the movement of the radioactive material is placed over your abdomen to monitor the rate at which food leaves your stomach. This test normally takes about 4 hours to complete. _____________________________________________________________________   We have sent the following medications to your pharmacy for you to pick up at your convenience: Budesonide,Omeprazole,Zofran  If your blood pressure at your visit was 140/90 or greater, please contact your primary care physician to follow up on this.   If you are age 78 or older, your body mass index should be between 23-30. Your There is no height or weight on file to calculate BMI. If this is out of the aforementioned range listed, please consider follow up with your Primary Care Provider.  If you are age 78 or younger, your body mass index should be between 19-25. Your There is no height or weight on file to calculate BMI. If this is out of the aformentioned range listed, please consider follow up  with your Primary Care Provider.    The [facility] providers would like to encourage you to use [facility] to communicate with providers for non-urgent requests or questions.  Due to long hold times on the telephone, sending your provider a message by [facility] may be a faster and more efficient way to get a response.  Please allow 48 business hours for a response.  Please remember that this is for non-urgent requests.   Due to recent changes in healthcare laws, you may see the results of your imaging and laboratory studies on MyChart before your provider has had a chance to review them.  We understand that in some cases there may be results that are confusing or concerning to you. Not all laboratory results come back in the same time frame and the provider may be waiting for multiple results in order to interpret others.  Please give [place] 48 hours in order for your provider to thoroughly review all the results before contacting the office for clarification of your results.    Thank you for entrusting me with your care and for choosing [facility], Dr. [name]

## 2022-10-29 NOTE — Progress Notes (Signed)
Chief Complaint: Microscopic colitis  HPI : 77 year old female with history of microscopic colitis, IBS, DVT, DM, GERD, HTN, HFpEF, OSA, and psoriatic arthritis on Cimzia presents for follow up for microscopic colitis  Interval History: She was recently seen in the ED on 3/13 for epigastric abdominal pain and vomiting.  Patient received a basic cardiac workup with ECG and troponins that was negative.  CBC showed a mildly elevated WBC of 12.2.  Lipase and LFTs were normal.  Chest x-ray was negative.  CT AP showed some thickened appearance of the gastric mucosa and atrophy of the pancreas.  She was instructed to take Pepcid and Maalox as well as daily omeprazole.  She has been having epigastric abdominal discomfort for 6 months, but this became intolerable last week. The pain is not affected by BMs or food intake. She has lost about 65 lbs over the course of a year, which was deliberate. She feels like her abdomen is swollen. Over the last 6 months, she has had several episodes of vomiting. Most of the time this is dry heaves with a small amount of food. She has had issues with heartburn but this has been getting worse. Endorses chest burning and regurgitation. The Pepcid that was started recently has helped a small amount. Endorses feeling low energy and having some memory issues. She took one dose of Mounjaro two months ago and she was not able to tolerate due to nausea. She has not taken another dose of Mounjaro since then. Her bowel habits have been good in terms of diarrhea. Patient's insurance no longer covers her Uceris so she has been taking the budesonide 9 mg QD, which seems to be working well. She has one BM per day. She has constipation on occasion for which she will take a stool softener. She is taking Imodium 2 mg BID. Denies dizziness but has felt lightheaded on occasion. Has occasional headaches but these are not severe. Back pain has been horrible for which she takes hydrocodone-Tylenol and  oxycodone-Tylenol. She usually takes about 1-2 opioid pills per day. She does not think that the narcotics are causing her ab pain or N&V. Endorses dysphagia mostly to solid foods, which she feel a few times per week. She is not sure if the last dilation she had helped with her dysphagia.   Wt Readings from Last 3 Encounters:  10/21/22 199 lb (90.3 kg)  10/14/22 199 lb (90.3 kg)  10/01/22 208 lb 1.8 oz (94.4 kg)   Current Outpatient Medications  Medication Sig Dispense Refill   Accu-Chek Softclix Lancets lancets Use as instructed to check sugars 2-3 times daily.  E11.42 100 each 12   acetaminophen (TYLENOL) 325 MG tablet Take 2 tablets (650 mg total) by mouth every 6 (six) hours as needed for mild pain or headache.     Alcohol Swabs PADS UAD to check sugars.  E11.65 100 each 12   allopurinol (ZYLOPRIM) 100 MG tablet TAKE 1 TABLET(100 MG) BY MOUTH TWICE DAILY 180 tablet 3   ALPRAZolam (XANAX) 0.25 MG tablet TAKE 1 TABLET BY MOUTH THREE TIMES A DAY AS NEEDED FOR ANXIETY 30 tablet 2   alum & mag hydroxide-simeth (MAALOX MAX) [id number] MG/5ML suspension Take 10 mLs by mouth every 6 (six) hours as needed for indigestion. 355 mL 0   augmented betamethasone dipropionate (DIPROLENE-AF) 0.05 % cream      azelastine (ASTELIN) 0.1 % nasal spray INSTILL 2 SPRAYS IN EACH NOSTRIL TWICE DAILY 30 mL 12   Blood  Glucose Monitoring Suppl (ACCU-CHEK GUIDE) w/Device KIT AS DIRECTED 1 kit 0   budesonide (ENTOCORT EC) 3 MG 24 hr capsule Take 3 capsules (9 mg total) by mouth daily. 90 capsule 2   carvedilol (COREG) 6.25 MG tablet TAKE 1 TABLET BY MOUTH TWICE A DAY WITH A MEAL 60 tablet 3   Certolizumab Pegol (CIMZIA [place]) Inject 400 mg into the skin every 30 (thirty) days. 200 mg on each side of stomach     Cholecalciferol (VITAMIN D3) 1000 UNITS CAPS Take 1,000 Units by mouth 2 (two) times daily.      ciclopirox (LOPROX) 0.77 % cream Apply topically.     clobetasol cream (TEMOVATE) [id number] % Apply 1 application.  topically 2 (two) times daily.     Cyanocobalamin (VITAMIN B-12) 1000 MCG SUBL Place 1,000 mcg under the tongue daily.     cyclobenzaprine (FLEXERIL) 10 MG tablet TAKE 1 TABLET BY MOUTH AT BEDTIME 90 tablet 1   docusate sodium (COLACE) 100 MG capsule TAKE 1 CAPSULE(100 MG) BY MOUTH TWICE DAILY 60 capsule 2   famotidine (PEPCID) 20 MG tablet Take 1 tablet (20 mg total) by mouth 2 (two) times daily. 60 tablet 0   folic acid (FOLVITE) 1 MG tablet TAKE 1 TABLET(1 MG) BY MOUTH DAILY 90 tablet 3   glucose blood (ACCU-CHEK GUIDE) test strip USE AS INSTRUCTED TO CHECK SUGARS 2-3 TIMES DAILY 100 strip 12   glucose blood (ONETOUCH VERIO) test strip one strip (1 each dose) daily.     HYDROcodone-acetaminophen (NORCO) 10-325 MG tablet Take 1 tablet by mouth 3 (three) times daily as needed.     insulin glargine (LANTUS SOLOSTAR) 100 UNIT/ML Solostar Pen Inject 20 Units into the skin 2 (two) times daily. 15 mL 5   Insulin Pen Needle (B-D UF III MINI PEN NEEDLES) 31G X 5 MM MISC Use daily for insulin pen 90 each 3   lisinopril (ZESTRIL) 20 MG tablet Take 1 tablet (20 mg total) by mouth daily. 90 tablet 3   loperamide (IMODIUM A-D) 2 MG tablet Take 1 tablet (2 mg total) by mouth 4 (four) times daily as needed for diarrhea or loose stools. 360 tablet 3   Melatonin 5 MG SUBL Place 5 mg under the tongue at bedtime.     metFORMIN (GLUCOPHAGE-XR) 500 MG 24 hr tablet TAKE 1 TABLET BY MOUTH TWICE A DAY 180 tablet 2   Methotrexate 2.5 MG/ML SOLN TAKE 8 TABLETSALL AT THE SAME TIME Orally ONCE WEEKLY for 90 days     naftifine (NAFTIN) 1 % cream Apply 1 application. topically daily as needed (rash).     omeprazole (PRILOSEC) 40 MG capsule Take 1 capsule (40 mg total) by mouth daily. 90 capsule 2   ondansetron (ZOFRAN-ODT) 4 MG disintegrating tablet Take 1 tablet (4 mg total) by mouth every 8 (eight) hours as needed for nausea or vomiting. 20 tablet 0   oxyCODONE-acetaminophen (PERCOCET) 10-325 MG tablet      potassium  chloride (KLOR-CON M) 10 MEQ tablet Take 2 tablets (20 mEq total) by mouth daily. 180 tablet 3   Probiotic Product (ALIGN PO) Take 1 capsule by mouth daily.     rosuvastatin (CRESTOR) 5 MG tablet TAKE 1 TABLET(5 MG) BY MOUTH DAILY 90 tablet 3   sertraline (ZOLOFT) 100 MG tablet Take 1.5 tablets (150 mg total) by mouth daily. 135 tablet 1   hydrochlorothiazide (MICROZIDE) 12.5 MG capsule Take 1 capsule (12.5 mg total) by mouth as needed (for swelling). 30 capsule 3  No current facility-administered medications for this visit.   Review of Systems: All systems reviewed and negative except where noted in HPI.   Physical Exam: BP 122/68   Pulse 97   Ht 5\' 6"  (1.676 m)   SpO2 99%   BMI 32.12 kg/m  Constitutional: Pleasant,well-developed, female in no acute distress. HEENT: Normocephalic and atraumatic. Conjunctivae are normal. No scleral icterus. Cardiovascular: Normal rate, regular rhythm.  Pulmonary/chest: Effort normal and breath sounds normal. No wheezing, rales or rhonchi. Abdominal: Soft, nondistended, nontender. Bowel sounds active throughout. There are no masses palpable. No hepatomegaly. Extremities: No edema Neurological: Alert and oriented to person place and time. Skin: Skin is warm and dry. No rashes noted. Psychiatric: Normal mood and affect. Behavior is normal.  Labs 11/2020: CMP with low K of 3.3, elevated glucose of 220, ALT mildly elevated at 45, albumin low at 3.2. HbA1C of 8.5%  Labs 05/2021: CBC with elevated WBC of 11.9. CRP mildly elevated at 1.2 (decreased from 1.6). ESR mildly elevated at 35.  Labs 10/2022: CBC showed a mildly elevated WBC of 12.2.  Lipase and LFTs were normal.   CT A/P w/contrast 10/02/18: 1.  Descending and sigmoid colon segmental mural thickening and  pericolonic stranding indicating colitis.  Distribution of thickening  can suggest ischemic colitis although inflammatory colitis is not  excluded.  There are mild associated diverticula but no  focal inflamed  diverticulum.  2.  Pancreas atrophy.  Multifocal hyperenhancing nodular density at  the tail of pancreas, 12 mm.  Small developing mass cannot be  excluded.  Recommend follow-up MRI pancreas in 6 months.  This finding  was not described in the initial [facility] preliminary report.  Ab U/S 10/03/18: Mild fatty liver. Cholelithiasis without findings of acute  cholecystitis. Extra hepatic bile ducts and pancreatic tail are  obscured.  CT A/P w/contrast 11/02/18: IMPRESSION: 1. Heterogeneous fatty replacement throughout the pancreas, without pancreatic mass or acute process. 2.  Possible constipation. 3. Hepatic steatosis and hepatomegaly. 4.  Tiny hiatal hernia. 5.  Aortic Atherosclerosis (ICD10-I70.0). 6. Degraded evaluation of the pelvis, secondary to beam hardening artifact from right hip arthroplasty. 7. Uterine fibroid.  CT A/P w/o contrast 07/12/20: IMPRESSION: 1. Diverticulosis without diverticulitis. 2. No acute intra-abdominal or intrapelvic process. 3.  Aortic Atherosclerosis (ICD10-I70.0).  Barium swallow 09/27/20: IMPRESSION: 1. Proximal anterior esophageal web, nonobstructive to passage of a 13 mm barium tablet. 2. Otherwise unremarkable double contrast barium esophagram.  CT A/P w/contrast 10/21/22: IMPRESSION: 1. No definite acute CT findings of the abdomen or pelvis to explain abdominal discomfort or vomiting. No evidence of bowel obstruction. 2. 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 3. Diffusely atrophic appearance of the pancreas. No pancreatic ductal dilatation or surrounding inflammatory changes. Aortic Atherosclerosis (ICD10-I70.0).  EGD 09/17/21: Findings: - Multiple mild (non-circumferential scarring) stenoses were found in the entire esophagus. The stenoses were traversed. A guidewire was placed and the scope was  withdrawn. Dilation was performed with a Savary dilator with no resistance at 15 mm and mild resistance at 16 mm. The dilation site was examined and showed mild mucosal disruption in the upper esophagus. - A small hiatal hernia was present. - Localized mild inflammation characterized by congestion (edema), erythema and granularity was found in the gastric body and in the gastric antrum. Biopsies were taken with a cold forceps for histology. - Multiple sessile polyps with no bleeding and no stigmata of recent bleeding  were found in the gastric body. Biopsies were taken with a cold forceps for histology. - The examined duodenum was normal. Impression: - Mild esophageal stenoses. Dilated. - Small hiatal hernia. - Gastritis. Biopsied. - Multiple gastric polyps. Biopsied. - Normal examined duodenum. Path: 1. Surgical [P], gastric - ANTRAL AND OXYNTIC MUCOSA WITH SLIGHT CHRONIC INFLAMMATION. - NO HELICOBACTER PYLORI IDENTIFIED. 2. Surgical [P], gastric polyps - FUNDIC GLAND POLYPS.  Colonoscopy 09/17/21: - One 12 mm polyp in the cecum, removed with a hot snare. Resected and retrieved. - Seven 3 to 8 mm polyps in the transverse colon and in the ascending colon, removed with a cold snare. Resected and retrieved. - Diverticulosis in the sigmoid colon and in the descending colon. - One 7 mm polyp in the rectum, removed with a cold snare. Resected and retrieved. - Non-bleeding internal hemorrhoids. Path: 3. Surgical [P], colon, cecum, polyp (1) - TUBULAR ADENOMA WITHOUT HIGH GRADE DYSPLASIA. 4. Surgical [P], colon, ascending, transverse, polyp (7) - TUBULAR ADENOMA (7) WITHOUT HIGH GRADE DYSPLASIA. 5. Surgical [P], colon, random sites - UNREMARKABLE COLONIC MUCOSA. - NO MICROSCOPIC COLITIS, ACTIVE INFLAMMATION OR CHRONIC CHANGES. 6. Surgical [P], colon, rectum, polyp (1) - TUBULOVILLOUS ADENOMA WITHOUT HIGH GRADE DYPLASIA.  ASSESSMENT AND PLAN: N&V Epigastric ab pain Microscopic  colitis IBS GERD Fatigue Esophageal stenosis Dysphagia Gastric thickening on CT scan Atrophic appearance of pancreas on CT Notes some issues with nausea, vomiting, and abdominal pain over the last 6 months.  Unclear etiology at this time, though workup thus far does show some thickening of the stomach lining seen on CT scan and an atrophic appearance of the pancreas.  Patient does describe some issues with uncontrolled GERD, which could be contributing to her nausea and vomiting.  Patient also describes some intermittent constipation so I asked her to back off on the Imodium therapy ordered to see if this helps with some of her symptoms.  Patient's constipation may be related to using opioid medications 1-2 times per day. In the meantime will refill her Zofran to help with her nausea and vomiting symptomatically.  To rule out delayed gastric emptying as a contributor, we will plan for a gastric emptying study for further evaluation.  For further evaluation to have her gastric wall thickening, will plan for an EUS with Dr. Rush Landmark. - Check vitamin B12, folate, vitamin D, ferritin/IBC - Increase omeprazole 40 mg from QD to BID. Asked her to take it 30 min before eating. - Continue budesonide 9 mg QD - Will refill her Zofran - Patient will try to decrease her Imodium to see if this helps with her N&V and ab pain - Will get gastric emptying study, will need alternative to eggs since she cannot consume any eggs without N&V - Will plan for upper EUS with Dr. Rush Landmark for 4/1 at 11:30 AM - Consider testing for pancreatic insufficiency in the future due to pancreas atroph seen on CT scan  [name], MD  I spent 44 minutes of time, including in depth chart review, independent review of results as outlined above, communicating results with the patient directly, face-to-face time with the patient, coordinating care, and ordering studies and medications as appropriate, and documentation.

## 2022-10-29 NOTE — H&P (View-Only) (Signed)
 Chief Complaint: Microscopic colitis  HPI : 77 year old female with history of microscopic colitis, IBS, DVT, DM, GERD, HTN, HFpEF, OSA, and psoriatic arthritis on Cimzia presents for follow up for microscopic colitis  Interval History: She was recently seen in the ED on 3/13 for epigastric abdominal pain and vomiting.  Patient received a basic cardiac workup with ECG and troponins that was negative.  CBC showed a mildly elevated WBC of 12.2.  Lipase and LFTs were normal.  Chest x-ray was negative.  CT AP showed some thickened appearance of the gastric mucosa and atrophy of the pancreas.  She was instructed to take Pepcid and Maalox as well as daily omeprazole.  She has been having epigastric abdominal discomfort for 6 months, but this became intolerable last week. The pain is not affected by BMs or food intake. She has lost about 65 lbs over the course of a year, which was deliberate. She feels like her abdomen is swollen. Over the last 6 months, she has had several episodes of vomiting. Most of the time this is dry heaves with a small amount of food. She has had issues with heartburn but this has been getting worse. Endorses chest burning and regurgitation. The Pepcid that was started recently has helped a small amount. Endorses feeling low energy and having some memory issues. She took one dose of Mounjaro two months ago and she was not able to tolerate due to nausea. She has not taken another dose of Mounjaro since then. Her bowel habits have been good in terms of diarrhea. Patient's insurance no longer covers her Uceris so she has been taking the budesonide 9 mg QD, which seems to be working well. She has one BM per day. She has constipation on occasion for which she will take a stool softener. She is taking Imodium 2 mg BID. Denies dizziness but has felt lightheaded on occasion. Has occasional headaches but these are not severe. Back pain has been horrible for which she takes hydrocodone-Tylenol and  oxycodone-Tylenol. She usually takes about 1-2 opioid pills per day. She does not think that the narcotics are causing her ab pain or N&V. Endorses dysphagia mostly to solid foods, which she feel a few times per week. She is not sure if the last dilation she had helped with her dysphagia.   Wt Readings from Last 3 Encounters:  10/21/22 199 lb (90.3 kg)  10/14/22 199 lb (90.3 kg)  10/01/22 208 lb 1.8 oz (94.4 kg)   Current Outpatient Medications  Medication Sig Dispense Refill   Accu-Chek Softclix Lancets lancets Use as instructed to check sugars 2-3 times daily.  E11.42 100 each 12   acetaminophen (TYLENOL) 325 MG tablet Take 2 tablets (650 mg total) by mouth every 6 (six) hours as needed for mild pain or headache.     Alcohol Swabs PADS UAD to check sugars.  E11.65 100 each 12   allopurinol (ZYLOPRIM) 100 MG tablet TAKE 1 TABLET(100 MG) BY MOUTH TWICE DAILY 180 tablet 3   ALPRAZolam (XANAX) 0.25 MG tablet TAKE 1 TABLET BY MOUTH THREE TIMES A DAY AS NEEDED FOR ANXIETY 30 tablet 2   alum & mag hydroxide-simeth (MAALOX MAX) 400-400-40 MG/5ML suspension Take 10 mLs by mouth every 6 (six) hours as needed for indigestion. 355 mL 0   augmented betamethasone dipropionate (DIPROLENE-AF) 0.05 % cream      azelastine (ASTELIN) 0.1 % nasal spray INSTILL 2 SPRAYS IN EACH NOSTRIL TWICE DAILY 30 mL 12   Blood   Glucose Monitoring Suppl (ACCU-CHEK GUIDE) w/Device KIT AS DIRECTED 1 kit 0   budesonide (ENTOCORT EC) 3 MG 24 hr capsule Take 3 capsules (9 mg total) by mouth daily. 90 capsule 2   carvedilol (COREG) 6.25 MG tablet TAKE 1 TABLET BY MOUTH TWICE A DAY WITH A MEAL 60 tablet 3   Certolizumab Pegol (CIMZIA Nectar) Inject 400 mg into the skin every 30 (thirty) days. 200 mg on each side of stomach     Cholecalciferol (VITAMIN D3) 1000 UNITS CAPS Take 1,000 Units by mouth 2 (two) times daily.      ciclopirox (LOPROX) 0.77 % cream Apply topically.     clobetasol cream (TEMOVATE) 0.05 % Apply 1 application.  topically 2 (two) times daily.     Cyanocobalamin (VITAMIN B-12) 1000 MCG SUBL Place 1,000 mcg under the tongue daily.     cyclobenzaprine (FLEXERIL) 10 MG tablet TAKE 1 TABLET BY MOUTH AT BEDTIME 90 tablet 1   docusate sodium (COLACE) 100 MG capsule TAKE 1 CAPSULE(100 MG) BY MOUTH TWICE DAILY 60 capsule 2   famotidine (PEPCID) 20 MG tablet Take 1 tablet (20 mg total) by mouth 2 (two) times daily. 60 tablet 0   folic acid (FOLVITE) 1 MG tablet TAKE 1 TABLET(1 MG) BY MOUTH DAILY 90 tablet 3   glucose blood (ACCU-CHEK GUIDE) test strip USE AS INSTRUCTED TO CHECK SUGARS 2-3 TIMES DAILY 100 strip 12   glucose blood (ONETOUCH VERIO) test strip one strip (1 each dose) daily.     HYDROcodone-acetaminophen (NORCO) 10-325 MG tablet Take 1 tablet by mouth 3 (three) times daily as needed.     insulin glargine (LANTUS SOLOSTAR) 100 UNIT/ML Solostar Pen Inject 20 Units into the skin 2 (two) times daily. 15 mL 5   Insulin Pen Needle (B-D UF III MINI PEN NEEDLES) 31G X 5 MM MISC Use daily for insulin pen 90 each 3   lisinopril (ZESTRIL) 20 MG tablet Take 1 tablet (20 mg total) by mouth daily. 90 tablet 3   loperamide (IMODIUM A-D) 2 MG tablet Take 1 tablet (2 mg total) by mouth 4 (four) times daily as needed for diarrhea or loose stools. 360 tablet 3   Melatonin 5 MG SUBL Place 5 mg under the tongue at bedtime.     metFORMIN (GLUCOPHAGE-XR) 500 MG 24 hr tablet TAKE 1 TABLET BY MOUTH TWICE A DAY 180 tablet 2   Methotrexate 2.5 MG/ML SOLN TAKE 8 TABLETSALL AT THE SAME TIME Orally ONCE WEEKLY for 90 days     naftifine (NAFTIN) 1 % cream Apply 1 application. topically daily as needed (rash).     omeprazole (PRILOSEC) 40 MG capsule Take 1 capsule (40 mg total) by mouth daily. 90 capsule 2   ondansetron (ZOFRAN-ODT) 4 MG disintegrating tablet Take 1 tablet (4 mg total) by mouth every 8 (eight) hours as needed for nausea or vomiting. 20 tablet 0   oxyCODONE-acetaminophen (PERCOCET) 10-325 MG tablet      potassium  chloride (KLOR-CON M) 10 MEQ tablet Take 2 tablets (20 mEq total) by mouth daily. 180 tablet 3   Probiotic Product (ALIGN PO) Take 1 capsule by mouth daily.     rosuvastatin (CRESTOR) 5 MG tablet TAKE 1 TABLET(5 MG) BY MOUTH DAILY 90 tablet 3   sertraline (ZOLOFT) 100 MG tablet Take 1.5 tablets (150 mg total) by mouth daily. 135 tablet 1   hydrochlorothiazide (MICROZIDE) 12.5 MG capsule Take 1 capsule (12.5 mg total) by mouth as needed (for swelling). 30 capsule 3     No current facility-administered medications for this visit.   Review of Systems: All systems reviewed and negative except where noted in HPI.   Physical Exam: BP 122/68   Pulse 97   Ht 5' 6" (1.676 m)   SpO2 99%   BMI 32.12 kg/m  Constitutional: Pleasant,well-developed, female in no acute distress. HEENT: Normocephalic and atraumatic. Conjunctivae are normal. No scleral icterus. Cardiovascular: Normal rate, regular rhythm.  Pulmonary/chest: Effort normal and breath sounds normal. No wheezing, rales or rhonchi. Abdominal: Soft, nondistended, nontender. Bowel sounds active throughout. There are no masses palpable. No hepatomegaly. Extremities: No edema Neurological: Alert and oriented to person place and time. Skin: Skin is warm and dry. No rashes noted. Psychiatric: Normal mood and affect. Behavior is normal.  Labs 11/2020: CMP with low K of 3.3, elevated glucose of 220, ALT mildly elevated at 45, albumin low at 3.2. HbA1C of 8.5%  Labs 05/2021: CBC with elevated WBC of 11.9. CRP mildly elevated at 1.2 (decreased from 1.6). ESR mildly elevated at 35.  Labs 10/2022: CBC showed a mildly elevated WBC of 12.2.  Lipase and LFTs were normal.   CT A/P w/contrast 10/02/18: 1.  Descending and sigmoid colon segmental mural thickening and  pericolonic stranding indicating colitis.  Distribution of thickening  can suggest ischemic colitis although inflammatory colitis is not  excluded.  There are mild associated diverticula but no  focal inflamed  diverticulum.  2.  Pancreas atrophy.  Multifocal hyperenhancing nodular density at  the tail of pancreas, 12 mm.  Small developing mass cannot be  excluded.  Recommend follow-up MRI pancreas in 6 months.  This finding  was not described in the initial [facility] preliminary report.  Ab U/S 10/03/18: Mild fatty liver. Cholelithiasis without findings of acute  cholecystitis. Extra hepatic bile ducts and pancreatic tail are  obscured.  CT A/P w/contrast 11/02/18: IMPRESSION: 1. Heterogeneous fatty replacement throughout the pancreas, without pancreatic mass or acute process. 2.  Possible constipation. 3. Hepatic steatosis and hepatomegaly. 4.  Tiny hiatal hernia. 5.  Aortic Atherosclerosis (ICD10-I70.0). 6. Degraded evaluation of the pelvis, secondary to beam hardening artifact from right hip arthroplasty. 7. Uterine fibroid.  CT A/P w/o contrast 07/12/20: IMPRESSION: 1. Diverticulosis without diverticulitis. 2. No acute intra-abdominal or intrapelvic process. 3.  Aortic Atherosclerosis (ICD10-I70.0).  Barium swallow 09/27/20: IMPRESSION: 1. Proximal anterior esophageal web, nonobstructive to passage of a 13 mm barium tablet. 2. Otherwise unremarkable double contrast barium esophagram.  CT A/P w/contrast 10/21/22: IMPRESSION: 1. No definite acute CT findings of the abdomen or pelvis to explain abdominal discomfort or vomiting. No evidence of bowel obstruction. 2. 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 3. Diffusely atrophic appearance of the pancreas. No pancreatic ductal dilatation or surrounding inflammatory changes. Aortic Atherosclerosis (ICD10-I70.0).  EGD 09/17/21: Findings: - Multiple mild (non-circumferential scarring) stenoses were found in the entire esophagus. The stenoses were traversed. A guidewire was placed and the scope was  withdrawn. Dilation was performed with a Savary dilator with no resistance at 15 mm and mild resistance at 16 mm. The dilation site was examined and showed mild mucosal disruption in the upper esophagus. - A small hiatal hernia was present. - Localized mild inflammation characterized by congestion (edema), erythema and granularity was found in the gastric body and in the gastric antrum. Biopsies were taken with a cold forceps for histology. - Multiple sessile polyps with no bleeding and no stigmata of recent bleeding   were found in the gastric body. Biopsies were taken with a cold forceps for histology. - The examined duodenum was normal. Impression: - Mild esophageal stenoses. Dilated. - Small hiatal hernia. - Gastritis. Biopsied. - Multiple gastric polyps. Biopsied. - Normal examined duodenum. Path: 1. Surgical [P], gastric - ANTRAL AND OXYNTIC MUCOSA WITH SLIGHT CHRONIC INFLAMMATION. - NO HELICOBACTER PYLORI IDENTIFIED. 2. Surgical [P], gastric polyps - FUNDIC GLAND POLYPS.  Colonoscopy 09/17/21: - One 12 mm polyp in the cecum, removed with a hot snare. Resected and retrieved. - Seven 3 to 8 mm polyps in the transverse colon and in the ascending colon, removed with a cold snare. Resected and retrieved. - Diverticulosis in the sigmoid colon and in the descending colon. - One 7 mm polyp in the rectum, removed with a cold snare. Resected and retrieved. - Non-bleeding internal hemorrhoids. Path: 3. Surgical [P], colon, cecum, polyp (1) - TUBULAR ADENOMA WITHOUT HIGH GRADE DYSPLASIA. 4. Surgical [P], colon, ascending, transverse, polyp (7) - TUBULAR ADENOMA (7) WITHOUT HIGH GRADE DYSPLASIA. 5. Surgical [P], colon, random sites - UNREMARKABLE COLONIC MUCOSA. - NO MICROSCOPIC COLITIS, ACTIVE INFLAMMATION OR CHRONIC CHANGES. 6. Surgical [P], colon, rectum, polyp (1) - TUBULOVILLOUS ADENOMA WITHOUT HIGH GRADE DYPLASIA.  ASSESSMENT AND PLAN: N&V Epigastric ab pain Microscopic  colitis IBS GERD Fatigue Esophageal stenosis Dysphagia Gastric thickening on CT scan Atrophic appearance of pancreas on CT Notes some issues with nausea, vomiting, and abdominal pain over the last 6 months.  Unclear etiology at this time, though workup thus far does show some thickening of the stomach lining seen on CT scan and an atrophic appearance of the pancreas.  Patient does describe some issues with uncontrolled GERD, which could be contributing to her nausea and vomiting.  Patient also describes some intermittent constipation so I asked her to back off on the Imodium therapy ordered to see if this helps with some of her symptoms.  Patient's constipation may be related to using opioid medications 1-2 times per day. In the meantime will refill her Zofran to help with her nausea and vomiting symptomatically.  To rule out delayed gastric emptying as a contributor, we will plan for a gastric emptying study for further evaluation.  For further evaluation to have her gastric wall thickening, will plan for an EUS with Dr. Mansouraty. - Check vitamin B12, folate, vitamin D, ferritin/IBC - Increase omeprazole 40 mg from QD to BID. Asked her to take it 30 min before eating. - Continue budesonide 9 mg QD - Will refill her Zofran - Patient will try to decrease her Imodium to see if this helps with her N&V and ab pain - Will get gastric emptying study, will need alternative to eggs since she cannot consume any eggs without N&V - Will plan for upper EUS with Dr. Mansouraty for 4/1 at 11:30 AM - Consider testing for pancreatic insufficiency in the future due to pancreas atroph seen on CT scan  Claire Wymon Swaney, MD  I spent 44 minutes of time, including in depth chart review, independent review of results as outlined above, communicating results with the patient directly, face-to-face time with the patient, coordinating care, and ordering studies and medications as appropriate, and documentation. 

## 2022-10-30 ENCOUNTER — Telehealth: Payer: Self-pay

## 2022-10-30 DIAGNOSIS — M170 Bilateral primary osteoarthritis of knee: Secondary | ICD-10-CM | POA: Diagnosis not present

## 2022-10-30 NOTE — Telephone Encounter (Signed)
Spoke to patient went over EUS procedure instructions schedule on 11/09/22 arrival at 10 am at [facility]. Patient verbalized understanding.

## 2022-11-02 ENCOUNTER — Encounter (HOSPITAL_COMMUNITY): Payer: Self-pay | Admitting: Gastroenterology

## 2022-11-02 NOTE — Progress Notes (Signed)
Attempted to obtain medical history via telephone, unable to reach at this time. HIPAA compliant voicemail message left requesting return call to pre surgical testing department. 

## 2022-11-04 DIAGNOSIS — M47816 Spondylosis without myelopathy or radiculopathy, lumbar region: Secondary | ICD-10-CM | POA: Diagnosis not present

## 2022-11-04 DIAGNOSIS — M47896 Other spondylosis, lumbar region: Secondary | ICD-10-CM | POA: Diagnosis not present

## 2022-11-09 ENCOUNTER — Encounter (HOSPITAL_COMMUNITY): Payer: Self-pay | Admitting: Gastroenterology

## 2022-11-09 ENCOUNTER — Encounter (HOSPITAL_COMMUNITY)
Admission: RE | Disposition: A | Discharge status: Home / Self Care | Payer: Self-pay | Source: Home / Self Care | Attending: Gastroenterology

## 2022-11-09 ENCOUNTER — Ambulatory Visit (HOSPITAL_COMMUNITY)
Admission: RE | Admit: 2022-11-09 | Discharge: 2022-11-09 | Disposition: A | Discharge status: Home / Self Care | Payer: Medicare Other | Attending: Gastroenterology | Admitting: Gastroenterology

## 2022-11-09 ENCOUNTER — Ambulatory Visit (HOSPITAL_BASED_OUTPATIENT_CLINIC_OR_DEPARTMENT_OTHER): Payer: Medicare Other | Admitting: Certified Registered Nurse Anesthetist

## 2022-11-09 ENCOUNTER — Ambulatory Visit (HOSPITAL_COMMUNITY): Payer: Medicare Other

## 2022-11-09 ENCOUNTER — Other Ambulatory Visit: Payer: Self-pay

## 2022-11-09 ENCOUNTER — Ambulatory Visit (HOSPITAL_COMMUNITY): Payer: Medicare Other | Admitting: Certified Registered Nurse Anesthetist

## 2022-11-09 DIAGNOSIS — I899 Noninfective disorder of lymphatic vessels and lymph nodes, unspecified: Secondary | ICD-10-CM | POA: Diagnosis not present

## 2022-11-09 DIAGNOSIS — G4730 Sleep apnea, unspecified: Secondary | ICD-10-CM | POA: Diagnosis not present

## 2022-11-09 DIAGNOSIS — Z86718 Personal history of other venous thrombosis and embolism: Secondary | ICD-10-CM | POA: Diagnosis not present

## 2022-11-09 DIAGNOSIS — K3189 Other diseases of stomach and duodenum: Secondary | ICD-10-CM | POA: Insufficient documentation

## 2022-11-09 DIAGNOSIS — K449 Diaphragmatic hernia without obstruction or gangrene: Secondary | ICD-10-CM | POA: Diagnosis not present

## 2022-11-09 DIAGNOSIS — Z794 Long term (current) use of insulin: Secondary | ICD-10-CM | POA: Insufficient documentation

## 2022-11-09 DIAGNOSIS — K8020 Calculus of gallbladder without cholecystitis without obstruction: Secondary | ICD-10-CM

## 2022-11-09 DIAGNOSIS — Z7984 Long term (current) use of oral hypoglycemic drugs: Secondary | ICD-10-CM | POA: Diagnosis not present

## 2022-11-09 DIAGNOSIS — R109 Unspecified abdominal pain: Secondary | ICD-10-CM | POA: Diagnosis not present

## 2022-11-09 DIAGNOSIS — E119 Type 2 diabetes mellitus without complications: Secondary | ICD-10-CM | POA: Insufficient documentation

## 2022-11-09 DIAGNOSIS — K2289 Other specified disease of esophagus: Secondary | ICD-10-CM

## 2022-11-09 DIAGNOSIS — K2950 Unspecified chronic gastritis without bleeding: Secondary | ICD-10-CM

## 2022-11-09 DIAGNOSIS — K869 Disease of pancreas, unspecified: Secondary | ICD-10-CM

## 2022-11-09 DIAGNOSIS — I5032 Chronic diastolic (congestive) heart failure: Secondary | ICD-10-CM | POA: Insufficient documentation

## 2022-11-09 DIAGNOSIS — I110 Hypertensive heart disease with heart failure: Secondary | ICD-10-CM | POA: Insufficient documentation

## 2022-11-09 DIAGNOSIS — I10 Essential (primary) hypertension: Secondary | ICD-10-CM | POA: Diagnosis not present

## 2022-11-09 DIAGNOSIS — K8010 Calculus of gallbladder with chronic cholecystitis without obstruction: Secondary | ICD-10-CM | POA: Insufficient documentation

## 2022-11-09 DIAGNOSIS — R935 Abnormal findings on diagnostic imaging of other abdominal regions, including retroperitoneum: Secondary | ICD-10-CM | POA: Insufficient documentation

## 2022-11-09 DIAGNOSIS — Z79899 Other long term (current) drug therapy: Secondary | ICD-10-CM | POA: Diagnosis not present

## 2022-11-09 DIAGNOSIS — K317 Polyp of stomach and duodenum: Secondary | ICD-10-CM | POA: Diagnosis not present

## 2022-11-09 DIAGNOSIS — K219 Gastro-esophageal reflux disease without esophagitis: Secondary | ICD-10-CM | POA: Insufficient documentation

## 2022-11-09 DIAGNOSIS — K222 Esophageal obstruction: Secondary | ICD-10-CM

## 2022-11-09 DIAGNOSIS — R079 Chest pain, unspecified: Secondary | ICD-10-CM | POA: Diagnosis not present

## 2022-11-09 DIAGNOSIS — R1013 Epigastric pain: Secondary | ICD-10-CM

## 2022-11-09 HISTORY — PX: UPPER ESOPHAGEAL ENDOSCOPIC ULTRASOUND (EUS): SHX6562

## 2022-11-09 HISTORY — PX: BIOPSY: SHX5522

## 2022-11-09 HISTORY — PX: ESOPHAGOGASTRODUODENOSCOPY: SHX5428

## 2022-11-09 LAB — COMPREHENSIVE METABOLIC PANEL
ALT: 15 U/L (ref 0–44)
AST: 20 U/L (ref 15–41)
Albumin: 3.4 g/dL — ABNORMAL LOW (ref 3.5–5.0)
Alkaline Phosphatase: 54 U/L (ref 38–126)
Anion gap: 6 (ref 5–15)
BUN: 22 mg/dL (ref 8–23)
CO2: 29 mmol/L (ref 22–32)
Calcium: 9.5 mg/dL (ref 8.9–10.3)
Chloride: 98 mmol/L (ref 98–111)
Creatinine, Ser: 0.87 mg/dL (ref 0.44–1.00)
GFR, Estimated: >60 mL/min (ref >60)
Glucose, Bld: 105 mg/dL — ABNORMAL HIGH (ref 70–99)
Potassium: 4.5 mmol/L (ref 3.5–5.1)
Sodium: 133 mmol/L — ABNORMAL LOW (ref 135–145)
Total Bilirubin: 0.5 mg/dL (ref 0.3–1.2)
Total Protein: 6.4 g/dL — ABNORMAL LOW (ref 6.5–8.1)

## 2022-11-09 LAB — AMYLASE: Amylase: 33 U/L (ref 28–100)

## 2022-11-09 LAB — GLUCOSE, CAPILLARY: Glucose-Capillary: 95 mg/dL (ref 70–99)

## 2022-11-09 LAB — LIPASE, BLOOD: Lipase: 22 U/L (ref 11–51)

## 2022-11-09 SURGERY — UPPER ESOPHAGEAL ENDOSCOPIC ULTRASOUND (EUS)
Anesthesia: Monitor Anesthesia Care

## 2022-11-09 MED ORDER — SODIUM CHLORIDE 0.9 % IV SOLN: INTRAVENOUS | Status: DC

## 2022-11-09 MED ORDER — ONDANSETRON HCL 4 MG/2ML IJ SOLN: 4.0000 mg | Freq: Once | INTRAMUSCULAR | Status: DC | PRN

## 2022-11-09 MED ORDER — AMISULPRIDE (ANTIEMETIC) 5 MG/2ML IV SOLN: 10.0000 mg | Freq: Once | INTRAVENOUS | Status: DC | PRN

## 2022-11-09 MED ORDER — LACTATED RINGERS IV SOLN
INTRAVENOUS | Status: AC | PRN
  Administered 2022-11-09: 1000 mL via INTRAVENOUS

## 2022-11-09 MED ORDER — PROPOFOL 500 MG/50ML IV EMUL
INTRAVENOUS | Status: DC | PRN
  Administered 2022-11-09: 75 ug/kg/min via INTRAVENOUS
  Administered 2022-11-09: 60 mg via INTRAVENOUS

## 2022-11-09 MED ORDER — LACTATED RINGERS IV SOLN: INTRAVENOUS | Status: DC | PRN

## 2022-11-09 NOTE — Progress Notes (Signed)
Patient evaluated in the postprocedural recovery area with her husband. Patient's having similar type of discomfort as she has been experiencing over the course of the last year with a very slight dull discomfort in her mid epigastrium/right upper quadrant. Based on our EGD/EUS being performed today, the risks of complications would be extremely unlikely. I will still move forward with a stat KUB/chest x-ray to ensure that we do not see any evidence of an abnormality that is more concerning. I will also have the patient have a CMP as well as amylase and lipase drawn. The findings of my EUS are noted in the report, but 1 would query whether the patient could have underlying functional abdominal discomfort if our workup is negative here but with the findings of new gallstone disease that was not known before, will need to have consideration and discussions as to whether the gallbladder could be causing some issues 2. We will update patient and patient's husband once these results have been returned (x-rays).   Justice Britain, MD [facility] # [id number]

## 2022-11-09 NOTE — Interval H&P Note (Signed)
History and Physical Interval Note:  11/09/2022 11:42 AM  [name] [name]  has presented today for surgery, with the diagnosis of Gastric Thickening, Abnormal Imaging CT abdomen.  The various methods of treatment have been discussed with the patient and family. After consideration of risks, benefits and other options for treatment, the patient has consented to  Procedure(s): UPPER ESOPHAGEAL ENDOSCOPIC ULTRASOUND (EUS) (N/A) as a surgical intervention.  The patient's history has been reviewed, patient examined, no change in status, stable for surgery.  I have reviewed the patient's chart and labs.  Questions were answered to the patient's satisfaction.     The risks of an EUS including intestinal perforation, bleeding, infection, aspiration, and medication effects were discussed as was the possibility it may not give a definitive diagnosis if a biopsy is performed.  When a biopsy of the pancreas is done as part of the EUS, there is an additional risk of pancreatitis at the rate of about 1-2%.  It was explained that procedure related pancreatitis is typically mild, although it can be severe and even life threatening, which is why we do not perform random pancreatic biopsies and only biopsy a lesion/area we feel is concerning enough to warrant the risk.    [employer]

## 2022-11-09 NOTE — Anesthesia Preprocedure Evaluation (Addendum)
Anesthesia Evaluation  Patient identified by MRN, date of birth, ID band Patient awake    Reviewed: Allergy & Precautions, NPO status , Patient's Chart, lab work & pertinent test results  Airway Mallampati: II  TM Distance: >3 FB Neck ROM: Full    Dental  (+) Teeth Intact, Dental Advisory Given   Pulmonary sleep apnea    breath sounds clear to auscultation       Cardiovascular hypertension, Pt. on home beta blockers and Pt. on medications + dysrhythmias  Rhythm:Regular Rate:Normal     Neuro/Psych  PSYCHIATRIC DISORDERS Anxiety Depression       GI/Hepatic Neg liver ROS,GERD  Medicated,,  Endo/Other  diabetes, Type 2, Insulin Dependent    Renal/GU negative Renal ROS     Musculoskeletal  (+) Arthritis ,    Abdominal   Peds  Hematology negative hematology ROS (+)   Anesthesia Other Findings   Reproductive/Obstetrics                             Anesthesia Physical Anesthesia Plan  ASA: 2  Anesthesia Plan: MAC   Post-op Pain Management: Minimal or no pain anticipated   Induction: Intravenous  PONV Risk Score and Plan: 0 and Propofol infusion  Airway Management Planned: Natural Airway and Nasal Cannula  Additional Equipment: None  Intra-op Plan:   Post-operative Plan:   Informed Consent: I have reviewed the patients History and Physical, chart, labs and discussed the procedure including the risks, benefits and alternatives for the proposed anesthesia with the patient or authorized representative who has indicated his/her understanding and acceptance.       Plan Discussed with: CRNA  Anesthesia Plan Comments: (Lab Results      Component                Value               Date                      WBC                      12.2 (H)            10/21/2022                HGB                      11.2 (L)            10/21/2022                HCT                      35.2 (L)             10/21/2022                MCV                      93.4                10/21/2022                PLT                      214  10/21/2022           )       Anesthesia Quick Evaluation

## 2022-11-09 NOTE — Transfer of Care (Signed)
Immediate Anesthesia Transfer of Care Note  Patient: [name] [name]  Procedure(s) Performed: Procedure(s): UPPER ESOPHAGEAL ENDOSCOPIC ULTRASOUND (EUS) (N/A) ESOPHAGOGASTRODUODENOSCOPY (EGD) (N/A) BIOPSY  Patient Location: PACU and Endoscopy Unit  Anesthesia Type:MAC  Level of Consciousness: awake, alert  and oriented  Airway & Oxygen Therapy: Patient Spontanous Breathing and Patient connected to nasal cannula oxygen  Post-op Assessment: Report given to RN and Post -op Vital signs reviewed and stable  Post vital signs: Reviewed and stable  Last Vitals:  Vitals:   11/09/22 1041  BP: (!) 122/53  Pulse: 68  Resp: 10  Temp: 36.4 C  SpO2: [id number]    Complications: No apparent anesthesia complications

## 2022-11-09 NOTE — Op Note (Signed)
[facility] Main Patient Name: [name] [name] Procedure Date: 11/09/2022 MRN: [id number] Attending MD: Justice Britain , MD, [id number] Date of Birth: 03/19/1946 CSN: [id number] Age: 77 Admit Type: Outpatient Procedure:                Upper EUS Indications:              Abnormal abdominal/pelvic CT scan, Epigastric                            abdominal distress, Heartburn Providers:                Justice Britain, MD, Burtis Junes, RN, Darliss Cheney,                            Technician, Eliberto Ivory Referring MD:             Adline Mango" Molli Knock, MD Medicines:                Monitored Anesthesia Care Complications:            No immediate complications. Estimated Blood Loss:     Estimated blood loss was minimal. Procedure:                Pre-Anesthesia Assessment:                           - Prior to the procedure, a History and Physical                            was performed, and patient medications and                            allergies were reviewed. The patient's tolerance of                            previous anesthesia was also reviewed. The risks                            and benefits of the procedure and the sedation                            options and risks were discussed with the patient.                            All questions were answered, and informed consent                            was obtained. Prior Anticoagulants: The patient has                            taken no anticoagulant or antiplatelet agents. ASA                            Grade Assessment: III - A patient with severe  systemic disease. After reviewing the risks and                            benefits, the patient was deemed in satisfactory                            condition to undergo the procedure.                           After obtaining informed consent, the endoscope was                            passed under direct vision. Throughout  the                            procedure, the patient's blood pressure, pulse, and                            oxygen saturations were monitored continuously. The                            [id number]) Olympus endoscope was introduced                            through the mouth, and advanced to the second part                            of duodenum. The [id number]-[id number]) Olympus                            radial ultrasound scope was introduced through the                            mouth, and advanced to the duodenum for ultrasound                            examination from the esophagus, stomach and                            duodenum. The upper EUS was accomplished without                            difficulty. The patient tolerated the procedure.                            The [id number]) Olympus linear ultrasound                            scope was introduced through the mouth, and                            advanced to the duodenum for ultrasound examination  from the stomach and duodenum. Scope In: Scope Out: Findings:      ENDOSCOPIC FINDING: :      No gross lesions were noted in the entire esophagus.      The Z-line was irregular and was found 38 cm from the incisors.      A 3 cm hiatal hernia was present.      Diffuse moderately erythematous mucosa without bleeding was found in the       gastric antrum. Biopsies were taken with a cold forceps for histology       and Helicobacter pylori testing.      No gross lesions were noted in the entire examined stomach. Biopsies       were taken with a cold forceps for histology and Helicobacter pylori       testing.      No gross lesions were noted in the duodenal bulb, in the first portion       of the duodenum and in the second portion of the duodenum. Biopsies were       taken with a cold forceps for histology.      ENDOSONOGRAPHIC FINDING: :      The wall was visualized  endosonographically in the cardia of the stomach       (3.8 mm), in the body of the stomach (4.7 mm) and in the antrum of the       stomach (3.7 mm). This appeared to primarily be due to the deep mucosa       (Layer 2). There is no overt evidence of significant giant or prominent       gastric folds concerning for overt Menetrier's disease.      Wall thickening was visualized endosonographically in the pylorus. This       appeared to primarily be due to thickening within the muscularis propria       (Layer 4). The gastric wall measured up to 7 mm in thickness. It is       slightly prominent but there is no evidence of any mass or lesion in       this area.      Endosonographic imaging in the duodenal bulb and in the apex of the       duodenal bulb showed no intramural (subepithelial) lesion or wall       thickening.      Pancreatic parenchymal abnormalities were noted in the pancreatic head       (PD = 1.8 mm), genu of the pancreas (PD = 2.0 mm), pancreatic body (PD =       1.6 mm) and pancreatic tail (PD = 1.1 mm). These consisted of atrophy.      There was no sign of significant endosonographic abnormality in the       common bile duct (3.3 mm -> 4.5 mm) and in the common hepatic duct (7.1       mm).      Many stones were visualized endosonographically in the gallbladder. The       stones were round. They were hyperechoic and characterized by shadowing.      Endosonographic imaging of the ampulla showed no intramural       (subepithelial) lesion.      Endosonographic imaging in the visualized portion of the liver showed no       mass.      No malignant-appearing lymph nodes were visualized in the celiac region       (  level 20), peripancreatic region and porta hepatis region.      The celiac region was visualized. Impression:               EGD impression:                           - No gross lesions in the entire esophagus. Z-line                            irregular, 38 cm from the  incisors.                           - 3 cm hiatal hernia.                           - Erythematous mucosa in the antrum. Biopsied.                           - No other gross lesions in the entire stomach.                            Biopsied.                           - No gross lesions in the duodenal bulb, in the                            first portion of the duodenum and in the second                            portion of the duodenum. Biopsied.                           EUS impression:                           - Gastric wall was visualized and did not appear to                            be thickened in the cardia of the stomach, in the                            body of the stomach and in the antrum of the                            stomach.                           - Wall thickening was seen in the pylorus. The                            thickening appeared to be primarily within the                            muscularis propria (Layer  4). No overt mass or                            lesion was noted.                           - Pancreatic parenchymal abnormalities consisting                            of atrophy were noted in the pancreatic head, genu                            of the pancreas, pancreatic body and pancreatic                            tail. The pancreatic body was otherwise normal in                            appearance.                           - There was no sign of significant pathology in the                            common bile duct and in the common hepatic duct.                           - Many stones were visualized endosonographically                            in the gallbladder.                           - No malignant-appearing lymph nodes were                            visualized in the celiac region (level 20),                            peripancreatic region and porta hepatis region. Moderate Sedation:      Not Applicable - Patient had care  per Anesthesia. Recommendation:           - The patient will be observed post-procedure,                            until all discharge criteria are met.                           - Discharge patient to home.                           - Patient has a contact number available for                            emergencies. The signs and symptoms of potential  delayed complications were discussed with the                            patient. Return to normal activities tomorrow.                            Written discharge instructions were provided to the                            patient.                           - Resume previous diet.                           - Observe patient's clinical course.                           - Await path results.                           - Recommend discussion with patient's primary                            gastroenterologist after pathology returns as to                            whether referral for surgical evaluation and                            consideration of cholecystectomy may make sense                            based on the patient's symptomatology.                           - Patient's gastric emptying study needs to be                            rescheduled due to a passing in the family. Will                            forward this to Dr. Libby Maw team.                           - The findings and recommendations were discussed                            with the patient.                           - The findings and recommendations were discussed                            with the patient's family. Procedure Code(s):        --- Professional ---  [id number], Esophagogastroduodenoscopy, flexible,                            transoral; with endoscopic ultrasound examination                            limited to the esophagus, stomach or duodenum, and                            adjacent  structures                           43239, Esophagogastroduodenoscopy, flexible,                            transoral; with biopsy, single or multiple Diagnosis Code(s):        --- Professional ---                           K22.89, Other specified disease of esophagus                           K44.9, Diaphragmatic hernia without obstruction or                            gangrene                           K31.89, Other diseases of stomach and duodenum                           K86.9, Disease of pancreas, unspecified                           K80.20, Calculus of gallbladder without                            cholecystitis without obstruction                           I89.9, Noninfective disorder of lymphatic vessels                            and lymph nodes, unspecified                           R10.13, Epigastric pain                           R12, Heartburn                           R93.5, Abnormal findings on diagnostic imaging of                            other abdominal regions, including retroperitoneum CPT copyright 2022 [employer]. All rights reserved. The codes documented in this report are preliminary and upon coder review may  be  revised to meet current compliance requirements. Justice Britain, MD 11/09/2022 1:05:51 PM Number of Addenda: 0

## 2022-11-09 NOTE — Discharge Instructions (Signed)
YOU HAD AN ENDOSCOPIC PROCEDURE TODAY: Refer to the procedure report and other information in the discharge instructions given to you for any specific questions about what was found during the examination. If this information does not answer your questions, please call [place] office at [phone] to clarify.  ° °YOU SHOULD EXPECT: Some feelings of bloating in the abdomen. Passage of more gas than usual. Walking can help get rid of the air that was put into your GI tract during the procedure and reduce the bloating. If you had a lower endoscopy (such as a colonoscopy or flexible sigmoidoscopy) you may notice spotting of blood in your stool or on the toilet paper. Some abdominal soreness may be present for a day or two, also. ° °DIET: Your first meal following the procedure should be a light meal and then it is ok to progress to your normal diet. A half-sandwich or bowl of soup is an example of a good first meal. Heavy or fried foods are harder to digest and may make you feel nauseous or bloated. Drink plenty of fluids but you should avoid alcoholic beverages for 24 hours. If you had a esophageal dilation, please see attached instructions for diet.   ° °ACTIVITY: Your care partner should take you home directly after the procedure. You should plan to take it easy, moving slowly for the rest of the day. You can resume normal activity the day after the procedure however YOU SHOULD NOT DRIVE, use power tools, machinery or perform tasks that involve climbing or major physical exertion for 24 hours (because of the sedation medicines used during the test).  ° °SYMPTOMS TO REPORT IMMEDIATELY: °A gastroenterologist can be reached at any hour. Please call [phone]  for any of the following symptoms:  °Following lower endoscopy (colonoscopy, flexible sigmoidoscopy) °Excessive amounts of blood in the stool  °Significant tenderness, worsening of abdominal pains  °Swelling of the abdomen that is new, acute  °Fever of 100° or  higher  °Following upper endoscopy (EGD, EUS, ERCP, esophageal dilation) °Vomiting of blood or coffee ground material  °New, significant abdominal pain  °New, significant chest pain or pain under the shoulder blades  °Painful or persistently difficult swallowing  °New shortness of breath  °Black, tarry-looking or red, bloody stools ° °FOLLOW UP:  °If any biopsies were taken you will be contacted by phone or by letter within the next 1-3 weeks. Call [phone]  if you have not heard about the biopsies in 3 weeks.  °Please also call with any specific questions about appointments or follow up tests. ° °

## 2022-11-09 NOTE — Anesthesia Postprocedure Evaluation (Signed)
Anesthesia Post Note  Patient: [name] [name]  Procedure(s) Performed: UPPER ESOPHAGEAL ENDOSCOPIC ULTRASOUND (EUS) ESOPHAGOGASTRODUODENOSCOPY (EGD) BIOPSY     Patient location during evaluation: PACU Anesthesia Type: MAC Level of consciousness: awake and alert Pain management: pain level controlled Vital Signs Assessment: post-procedure vital signs reviewed and stable Respiratory status: spontaneous breathing, nonlabored ventilation, respiratory function stable and patient connected to nasal cannula oxygen Cardiovascular status: stable and blood pressure returned to baseline Postop Assessment: no apparent nausea or vomiting Anesthetic complications: no  No notable events documented.  Last Vitals:  Vitals:   11/09/22 1300 11/09/22 1310  BP: (!) 119/41 (!) 140/54  Pulse: 67 62  Resp: 13 12  Temp:    SpO2: 100% 100%    Last Pain:  Vitals:   11/09/22 1251  TempSrc: Temporal  PainSc: 0-No pain                 Effie Berkshire

## 2022-11-11 ENCOUNTER — Other Ambulatory Visit: Payer: Self-pay

## 2022-11-11 ENCOUNTER — Other Ambulatory Visit: Payer: Self-pay | Admitting: Internal Medicine

## 2022-11-11 MED ORDER — OMEPRAZOLE 40 MG PO CPDR: 40.0000 mg | DELAYED_RELEASE_CAPSULE | Freq: Two times a day (BID) | ORAL | 2 refills | Status: DC

## 2022-11-12 ENCOUNTER — Telehealth: Payer: Self-pay

## 2022-11-12 ENCOUNTER — Encounter (HOSPITAL_COMMUNITY): Payer: Self-pay | Admitting: Gastroenterology

## 2022-11-12 LAB — SURGICAL PATHOLOGY

## 2022-11-12 NOTE — Telephone Encounter (Signed)
Patient requested from Dr Rush Landmark at her procedure today, that the gastric emptying scan be rescheduled due to a death in her family. Test canceled. Will call the patient to advise on how to reschedule.

## 2022-11-13 ENCOUNTER — Encounter: Payer: Self-pay | Admitting: Gastroenterology

## 2022-11-13 NOTE — Telephone Encounter (Signed)
Spoke with the patient. She is advised the GES has been canceled. She is given the telephone number to the Radiology Scheduling and instructions to call and schedule at her convenience.

## 2022-11-20 ENCOUNTER — Encounter: Payer: Self-pay | Admitting: Cardiovascular Disease

## 2022-11-24 ENCOUNTER — Encounter: Payer: Self-pay | Admitting: Internal Medicine

## 2022-11-25 ENCOUNTER — Encounter (HOSPITAL_COMMUNITY)
Admission: RE | Admit: 2022-11-25 | Discharge: 2022-11-25 | Disposition: A | Discharge status: Home / Self Care | Payer: Medicare Other | Source: Ambulatory Visit | Attending: Internal Medicine | Admitting: Internal Medicine

## 2022-11-25 ENCOUNTER — Other Ambulatory Visit: Payer: Self-pay | Admitting: Internal Medicine

## 2022-11-25 ENCOUNTER — Encounter (HOSPITAL_COMMUNITY): Payer: Self-pay

## 2022-11-25 ENCOUNTER — Encounter: Payer: Self-pay | Admitting: Internal Medicine

## 2022-11-25 ENCOUNTER — Telehealth: Payer: Self-pay

## 2022-11-25 DIAGNOSIS — K219 Gastro-esophageal reflux disease without esophagitis: Secondary | ICD-10-CM | POA: Insufficient documentation

## 2022-11-25 DIAGNOSIS — K52839 Microscopic colitis, unspecified: Secondary | ICD-10-CM | POA: Insufficient documentation

## 2022-11-25 DIAGNOSIS — R1013 Epigastric pain: Secondary | ICD-10-CM | POA: Insufficient documentation

## 2022-11-25 DIAGNOSIS — R1310 Dysphagia, unspecified: Secondary | ICD-10-CM

## 2022-11-25 DIAGNOSIS — K222 Esophageal obstruction: Secondary | ICD-10-CM

## 2022-11-25 DIAGNOSIS — R112 Nausea with vomiting, unspecified: Secondary | ICD-10-CM

## 2022-11-25 NOTE — Telephone Encounter (Signed)
The patient is wanting to take the Budesonide 9 mg (Uceris)   Britta Mccreedy E Samaras  [facility] Clinical Pool (supporting Imogene Burn, MD)20 hours ago (4:35 PM)    I had a message from [employer] today that said they needed some additional info from my GI. This is for the Budesimide 9 ER. They said it would help for your office to call: [phone]

## 2022-12-01 ENCOUNTER — Encounter: Payer: Self-pay | Admitting: Internal Medicine

## 2022-12-01 ENCOUNTER — Ambulatory Visit (HOSPITAL_COMMUNITY)
Admission: RE | Admit: 2022-12-01 | Discharge: 2022-12-01 | Disposition: A | Discharge status: Home / Self Care | Payer: Medicare Other | Source: Ambulatory Visit | Attending: Internal Medicine | Admitting: Internal Medicine

## 2022-12-01 DIAGNOSIS — R112 Nausea with vomiting, unspecified: Secondary | ICD-10-CM | POA: Insufficient documentation

## 2022-12-01 DIAGNOSIS — K222 Esophageal obstruction: Secondary | ICD-10-CM | POA: Insufficient documentation

## 2022-12-01 DIAGNOSIS — K219 Gastro-esophageal reflux disease without esophagitis: Secondary | ICD-10-CM | POA: Diagnosis not present

## 2022-12-01 DIAGNOSIS — R1310 Dysphagia, unspecified: Secondary | ICD-10-CM | POA: Insufficient documentation

## 2022-12-01 DIAGNOSIS — R1013 Epigastric pain: Secondary | ICD-10-CM | POA: Diagnosis not present

## 2022-12-01 DIAGNOSIS — K52839 Microscopic colitis, unspecified: Secondary | ICD-10-CM | POA: Diagnosis not present

## 2022-12-01 DIAGNOSIS — Z0389 Encounter for observation for other suspected diseases and conditions ruled out: Secondary | ICD-10-CM | POA: Diagnosis not present

## 2022-12-01 MED ORDER — TECHNETIUM TC 99M SULFUR COLLOID
2.0000 | Freq: Once | INTRAVENOUS | Status: AC
  Administered 2022-12-01: 2 via INTRAVENOUS

## 2022-12-01 NOTE — Telephone Encounter (Signed)
PA previously denied, please see encounter from Feb.

## 2022-12-02 ENCOUNTER — Other Ambulatory Visit: Payer: Self-pay | Admitting: Internal Medicine

## 2022-12-03 ENCOUNTER — Encounter: Payer: Self-pay | Admitting: Internal Medicine

## 2022-12-03 MED ORDER — ALPRAZOLAM 0.25 MG PO TABS: 0.2500 mg | ORAL_TABLET | Freq: Two times a day (BID) | ORAL | 5 refills | Status: DC | PRN

## 2022-12-03 NOTE — Telephone Encounter (Signed)
Attempted to call the patient but the phone call went to voicemail. I left her a voicemail letting her know that I had called. I told her that I would message her back on MyChart and that I would be happy to call back if she prefers to talk via phone instead.

## 2022-12-04 ENCOUNTER — Other Ambulatory Visit: Payer: Self-pay

## 2022-12-04 ENCOUNTER — Encounter: Payer: Self-pay | Admitting: Internal Medicine

## 2022-12-04 MED ORDER — BUDESONIDE 3 MG PO CPEP: 9.0000 mg | ORAL_CAPSULE | Freq: Every day | ORAL | 3 refills | Status: DC

## 2022-12-07 ENCOUNTER — Ambulatory Visit: Payer: TRICARE For Life (TFL) | Admitting: Podiatry

## 2022-12-07 ENCOUNTER — Encounter (HOSPITAL_COMMUNITY): Payer: Medicare Other

## 2022-12-11 DIAGNOSIS — K8020 Calculus of gallbladder without cholecystitis without obstruction: Secondary | ICD-10-CM | POA: Diagnosis not present

## 2022-12-11 DIAGNOSIS — K449 Diaphragmatic hernia without obstruction or gangrene: Secondary | ICD-10-CM | POA: Diagnosis not present

## 2022-12-11 DIAGNOSIS — Z8719 Personal history of other diseases of the digestive system: Secondary | ICD-10-CM | POA: Diagnosis not present

## 2022-12-14 ENCOUNTER — Ambulatory Visit (INDEPENDENT_AMBULATORY_CARE_PROVIDER_SITE_OTHER): Payer: Medicare Other | Admitting: Podiatry

## 2022-12-14 DIAGNOSIS — Z91199 Patient's noncompliance with other medical treatment and regimen due to unspecified reason: Secondary | ICD-10-CM

## 2022-12-14 NOTE — Progress Notes (Signed)
Patient did not show for scheduled appointment today.

## 2022-12-17 NOTE — Progress Notes (Signed)
Surgery orders requested via Epic inbox. °

## 2022-12-17 NOTE — Progress Notes (Signed)
Please place orders for PAT appointment scheduled 12/22/22. 

## 2022-12-17 NOTE — Patient Instructions (Addendum)
SURGICAL WAITING ROOM VISITATION  Patients having surgery or a procedure may have no more than 2 support people in the waiting area - these visitors may rotate.    Children under the age of 79 must have an adult with them who is not the patient.  Due to an increase in RSV and influenza rates and associated hospitalizations, children ages 73 and under may not visit patients in [facility] hospitals.  If the patient needs to stay at the hospital during part of their recovery, the visitor guidelines for inpatient rooms apply. Pre-op nurse will coordinate an appropriate time for 1 support person to accompany patient in pre-op.  This support person may not rotate.    Please refer to the [facility] website for the visitor guidelines for Inpatients (after your surgery is over and you are in a regular room).    Your procedure is scheduled on: 12/23/22   Report to [facility] Main Entrance    Report to admitting at 6:15 AM   Call this number if you have problems the morning of surgery [phone]   Do not eat food :After Midnight.   After Midnight you may have the following liquids until ______ AM/ PM DAY OF SURGERY  Water Non-Citrus Juices (without pulp, NO RED-Apple, White grape, White cranberry) Black Coffee (NO MILK/CREAM OR CREAMERS, sugar ok)  Clear Tea (NO MILK/CREAM OR CREAMERS, sugar ok) regular and decaf                             Plain Jell-O (NO RED)                                           Fruit ices (not with fruit pulp, NO RED)                                     Popsicles (NO RED)                                                               Sports drinks like Gatorade (NO RED)                      The day of surgery:  Drink ONE (1) Pre-Surgery Clear Ensure or G2 at AM the morning of surgery. Drink in one sitting. Do not sip.  This drink was given to you during your hospital  pre-op appointment visit. Nothing else to drink after completing the  Pre-Surgery   G2.          If you have questions, please contact your surgeon's office.   FOLLOW BOWEL PREP AND ANY ADDITIONAL PRE OP INSTRUCTIONS YOU RECEIVED FROM YOUR SURGEON'S OFFICE!!!     Oral Hygiene is also important to reduce your risk of infection.                                    Remember - BRUSH YOUR TEETH THE MORNING OF SURGERY  WITH YOUR REGULAR TOOTHPASTE  DENTURES WILL BE REMOVED PRIOR TO SURGERY PLEASE DO NOT APPLY "Poly grip" OR ADHESIVES!!!   Do NOT smoke after Midnight   Take these medicines the morning of surgery with A SIP OF WATER:   Allopurinol, Alprazolam, Budesonide, Carvidilol, Sertraline, Famotadine, Hydrocodone-acetaminophen, Omeprazole, Ondansetron, Oxycodone-acetaminophen, Nasal sprays  DO NOT TAKE ANY ORAL DIABETIC MEDICATIONS DAY OF YOUR SURGERY  Bring CPAP mask and tubing day of surgery.                              You may not have any metal on your body including hair pins, jewelry, and body piercing             Do not wear make-up, lotions, powders, perfumes/cologne, or deodorant  Do not wear nail polish including gel and S&S, artificial/acrylic nails, or any other type of covering on natural nails including finger and toenails. If you have artificial nails, gel coating, etc. that needs to be removed by a nail salon please have this removed prior to surgery or surgery may need to be canceled/ delayed if the surgeon/ anesthesia feels like they are unable to be safely monitored.   Do not shave  48 hours prior to surgery.         Do not bring valuables to the hospital. [facility] IS NOT             RESPONSIBLE   FOR VALUABLES.   Contacts, glasses, dentures or bridgework may not be worn into surgery.  DO NOT BRING YOUR HOME MEDICATIONS TO THE HOSPITAL. PHARMACY WILL DISPENSE MEDICATIONS LISTED ON YOUR MEDICATION LIST TO YOU DURING YOUR ADMISSION IN THE HOSPITAL!    Patients discharged on the day of surgery will not be allowed to drive home.  Someone NEEDS  to stay with you for the first 24 hours after anesthesia.   Special Instructions: Bring a copy of your healthcare power of attorney and living will documents the day of surgery if you haven't scanned them before.              Please read over the following fact sheets you were given: IF YOU HAVE QUESTIONS ABOUT YOUR PRE-OP INSTRUCTIONS PLEASE CALL [name]    If you received a COVID test during your pre-op visit  it is requested that you wear a mask when out in public, stay away from anyone that may not be feeling well and notify your surgeon if you develop symptoms. If you test positive for Covid or have been in contact with anyone that has tested positive in the last 10 days please notify you surgeon.    [name] - Preparing for Surgery Before surgery, you can play an important role.  Because skin is not sterile, your skin needs to be as free of germs as possible.  You can reduce the number of germs on your skin by washing with CHG (chlorahexidine gluconate) soap before surgery.  CHG is an antiseptic cleaner which kills germs and bonds with the skin to continue killing germs even after washing. Please DO NOT use if you have an allergy to CHG or antibacterial soaps.  If your skin becomes reddened/irritated stop using the CHG and inform your nurse when you arrive at Short Stay. Do not shave (including legs and underarms) for at least 48 hours prior to the first CHG shower.  You may shave your face/neck.  Please follow these  instructions carefully:  1.  Shower with CHG Soap the night before surgery and the  morning of surgery.  2.  If you choose to wash your hair, wash your hair first as usual with your normal  shampoo.  3.  After you shampoo, rinse your hair and body thoroughly to remove the shampoo.                             4.  Use CHG as you would any other liquid soap.  You can apply chg directly to the skin and wash.  Gently with a scrungie or clean washcloth.  5.  Apply the  CHG Soap to your body ONLY FROM THE NECK DOWN.   Do   not use on face/ open                           Wound or open sores. Avoid contact with eyes, ears mouth and   genitals (private parts).                       Wash face,  Genitals (private parts) with your normal soap.             6.  Wash thoroughly, paying special attention to the area where your    surgery  will be performed.  7.  Thoroughly rinse your body with warm water from the neck down.  8.  DO NOT shower/wash with your normal soap after using and rinsing off the CHG Soap.                9.  Pat yourself dry with a clean towel.            10.  Wear clean pajamas.            11.  Place clean sheets on your bed the night of your first shower and do not  sleep with pets. Day of Surgery : Do not apply any lotions/deodorants the morning of surgery.  Please wear clean clothes to the hospital/surgery center.  FAILURE TO FOLLOW THESE INSTRUCTIONS MAY RESULT IN THE CANCELLATION OF YOUR SURGERY  PATIENT SIGNATURE_________________________________  NURSE SIGNATURE__________________________________  ________________________________________________________________________How to Manage Your Diabetes Before and After Surgery  Why is it important to control my blood sugar before and after surgery? Improving blood sugar levels before and after surgery helps healing and can limit problems. A way of improving blood sugar control is eating a healthy diet by:  Eating less sugar and carbohydrates  Increasing activity/exercise  Talking with your doctor about reaching your blood sugar goals High blood sugars (greater than 180 mg/dL) can raise your risk of infections and slow your recovery, so you will need to focus on controlling your diabetes during the weeks before surgery. Make sure that the doctor who takes care of your diabetes knows about your planned surgery including the date and location.  How do I manage my blood sugar before  surgery? Check your blood sugar at least 4 times a day, starting 2 days before surgery, to make sure that the level is not too high or low. Check your blood sugar the morning of your surgery when you wake up and every 2 hours until you get to the Short Stay unit. If your blood sugar is less than 70 mg/dL, you will need to treat for low blood sugar: Do not take  insulin. Treat a low blood sugar (less than 70 mg/dL) with  cup of clear juice (cranberry or apple), 4 glucose tablets, OR glucose gel. Recheck blood sugar in 15 minutes after treatment (to make sure it is greater than 70 mg/dL). If your blood sugar is not greater than 70 mg/dL on recheck, call [phone] for further instructions. Report your blood sugar to the short stay nurse when you get to Short Stay.  If you are admitted to the hospital after surgery: Your blood sugar will be checked by the staff and you will probably be given insulin after surgery (instead of oral diabetes medicines) to make sure you have good blood sugar levels. The goal for blood sugar control after surgery is 80-180 mg/dL.   WHAT DO I DO ABOUT MY DIABETES MEDICATION?  Do not take oral diabetes medicines (pills) the morning of surgery.  THE NIGHT BEFORE SURGERY, take     units of       insulin.       THE MORNING OF SURGERY, take   units of         insulin.  DO NOT TAKE THE FOLLOWING 7 DAYS PRIOR TO SURGERY: Ozempic, Wegovy, Rybelsus (Semaglutide), Byetta (exenatide), Bydureon (exenatide ER), Victoza, Saxenda (liraglutide), or Trulicity (dulaglutide) Mounjaro (Tirzepatide) Adlyxin (Lixisenatide), Polyethylene Glycol Loxenatide.  If your CBG is greater than 220 mg/dL, you may take  of your sliding scale  (correction) dose of insulin.    For patients with insulin pumps: Contact your diabetes doctor for specific instructions before surgery. Decrease basal rates by 20% at midnight the night before your surgery. Note that if your surgery is planned to be  longer than 2 hours, your insulin pump will be removed and intravenous (IV) insulin will be started and managed by the nurses and the anesthesiologist. You will be able to restart your insulin pump once you are awake and able to manage it.  Make sure to bring insulin pump supplies to the hospital with you in case the  site needs to be changed.  Patient Signature:  Date:   Nurse Signature:  Date:   Reviewed and Endorsed by [facility] Patient Education Committee, August 2015

## 2022-12-17 NOTE — Progress Notes (Signed)
COVID Vaccine received:  []  No [x]  Yes Date of any COVID positive Test in last 80 days:No  PCP - Cheryll Cockayne MD Cardiologist - Nicki Guadalajara MD  Chest x-ray - 11/09/22 EPIC EKG -  10/23/22 EPIC Stress Test - 4 years ago ECHO - 02/06/20 EPIC Cardiac Cath - No  Bowel Prep - [x]  No  []   Yes ______  Pacemaker / ICD device [x]  No []  Yes   Spinal Cord Stimulator:[x]  No []  Yes       History of Sleep Apnea? []  No [x]  Yes   CPAP used?- []  No [x]  Yes  Bipap  Does the patient monitor blood sugar?          []  No [x]  Yes  []  N/A  Patient has: []  NO Hx DM   []  Pre-DM                 []  DM1  [x]   DM2 Does patient have a [employer] or Dexacom? [x]  No []  Yes   Fasting Blood Sugar Ranges- 90-130 Checks Blood Sugar __QOD___  GLP1 agonist / usual dose - No GLP1 instructions:  SGLT-2 inhibitors / usual dose - No SGLT-2 instructions:   Blood Thinner / Instructions:No Aspirin Instructions:  Comments:   Activity level: Patient is able  to climb a flight of stairs without difficulty; [x]  No CP  [x]  No SOB,    Patient can perform ADLs without assistance.   Anesthesia review:   Patient denies shortness of breath, fever, cough and chest pain at PAT appointment.  Patient verbalized understanding and agreement to the Pre-Surgical Instructions that were given to them at this PAT appointment. Patient was also educated of the need to review these PAT instructions again prior to his/her surgery.I reviewed the appropriate phone numbers to call if they have any and questions or concerns.

## 2022-12-18 DIAGNOSIS — E669 Obesity, unspecified: Secondary | ICD-10-CM | POA: Diagnosis not present

## 2022-12-18 DIAGNOSIS — Z6831 Body mass index (BMI) 31.0-31.9, adult: Secondary | ICD-10-CM | POA: Diagnosis not present

## 2022-12-18 DIAGNOSIS — L4059 Other psoriatic arthropathy: Secondary | ICD-10-CM | POA: Diagnosis not present

## 2022-12-18 DIAGNOSIS — Z79899 Other long term (current) drug therapy: Secondary | ICD-10-CM | POA: Diagnosis not present

## 2022-12-18 DIAGNOSIS — M1991 Primary osteoarthritis, unspecified site: Secondary | ICD-10-CM | POA: Diagnosis not present

## 2022-12-18 DIAGNOSIS — L408 Other psoriasis: Secondary | ICD-10-CM | POA: Diagnosis not present

## 2022-12-18 DIAGNOSIS — K5289 Other specified noninfective gastroenteritis and colitis: Secondary | ICD-10-CM | POA: Diagnosis not present

## 2022-12-18 DIAGNOSIS — M1009 Idiopathic gout, multiple sites: Secondary | ICD-10-CM | POA: Diagnosis not present

## 2022-12-18 NOTE — Progress Notes (Signed)
COVID Vaccine Completed: yes  Date of COVID positive in last 90 days:  PCP - Cheryll Cockayne, MD Cardiologist - Nicki Guadalajara, MD  Chest x-ray -  EKG - 10/23/22 Epic Stress Test - 2016 ECHO - 02/06/20 Epic Cardiac Cath -  Pacemaker/ICD device last checked: Spinal Cord Stimulator:  Bowel Prep -   Sleep Study -  CPAP -   Fasting Blood Sugar -  Checks Blood Sugar _____ times a day  Last dose of GLP1 agonist-  N/A GLP1 instructions:  N/A   Last dose of SGLT-2 inhibitors-  N/A SGLT-2 instructions: N/A   Blood Thinner Instructions:  Time Aspirin Instructions: Last Dose:  Activity level:  Can go up a flight of stairs and perform activities of daily living without stopping and without symptoms of chest pain or shortness of breath.  Able to exercise without symptoms  Unable to go up a flight of stairs without symptoms of     Anesthesia review: HTN, RBBB, OSA, DM2, SOB, mild mitral regurgitation   Patient denies shortness of breath, fever, cough and chest pain at PAT appointment  Patient verbalized understanding of instructions that were given to them at the PAT appointment. Patient was also instructed that they will need to review over the PAT instructions again at home before surgery.

## 2022-12-21 ENCOUNTER — Ambulatory Visit (INDEPENDENT_AMBULATORY_CARE_PROVIDER_SITE_OTHER): Payer: Medicare Other | Admitting: Podiatry

## 2022-12-21 ENCOUNTER — Encounter: Payer: Self-pay | Admitting: Internal Medicine

## 2022-12-21 DIAGNOSIS — B351 Tinea unguium: Secondary | ICD-10-CM | POA: Diagnosis not present

## 2022-12-21 DIAGNOSIS — M79675 Pain in left toe(s): Secondary | ICD-10-CM | POA: Diagnosis not present

## 2022-12-21 DIAGNOSIS — M79674 Pain in right toe(s): Secondary | ICD-10-CM

## 2022-12-21 DIAGNOSIS — L720 Epidermal cyst: Secondary | ICD-10-CM

## 2022-12-21 NOTE — Progress Notes (Signed)
Subjective:  Patient ID: [name] [name], female    DOB: [birth year],  MRN: [id number]   [name] [name] presents to clinic today for:  Chief Complaint  Patient presents with   Nail Problem    Diabetic foot care/Nail trim BS-did not check today A1C- 7.1  . Patient notes nails are thick, discolored, elongated and painful in shoegear when trying to ambulate.  She also notes a recurring cyst that occasionally causes irritation with shoes on the right great toe near the dorsal aspect.  Denies any recent drainage.  There is some crusting over the area.  PCP is Pincus Sanes, MD.  Last seen 09/17/22.  Allergies  Allergen Reactions   Other Shortness Of Breath    Blue fish: palms and feet turn red   Thorazine [Chlorpromazine] Other (See Comments)    Made her more more off balanced, vision   Bee Venom Swelling   Farxiga [Dapagliflozin] Swelling    Gi upset   Semaglutide     Rybelsus - lip swelling, GI upset   Sulfa Antibiotics Hives    Review of Systems: Negative except as noted in the HPI.  Objective:  [name] [name] is a pleasant 77 y.o. female in NAD. AAO x 3.  Vascular Examination: Capillary refill time is 3-5 seconds to toes bilateral. Palpable pedal pulses b/l LE. Digital hair present b/l. No pedal edema b/l. Skin temperature gradient WNL b/l.  No cyanosis or clubbing noted b/l.   Dermatological Examination: Pedal skin with normal turgor, texture and tone b/l. No open wounds. No interdigital macerations b/l. Toenails x10 are 3mm thick, discolored, dystrophic with subungual debris. There is pain with compression of the nail plates.  They are elongated x10.  There is a 4 mm raised lesion/cyst on the dorsal aspect of the right hallux IPJ along the medial aspect.  There was crusting on the dorsal aspect.  It is fluctuant and mobile indicating it is superficial.  There is no surrounding erythema.  Neurological Examination: Protective sensation diminished bilateral LE.    Musculoskeletal Examination: Muscle strength 5/5 to all LE muscle groups b/l.      Latest Ref Rng & Units 09/17/2022   10:49 AM 03/12/2022   10:26 AM  Hemoglobin A1C  Hemoglobin-A1c 4.6 - 6.5 % 6.3  6.6     Assessment/Plan: 1. Pain due to onychomycosis of toenails of both feet   2. Epidermal cyst    The mycotic toenails were sharply debrided x10 with sterile nail nippers and a power debriding burr to decrease bulk/thickness and length.    Discussed puncture/aspiration of the cyst on the dorsal aspect of the right hallux.  The patient's consent the area was prepped with alcohol, and a sterile #[id number] blade was utilized to sharply puncture the cyst.  Thick clear gelatinous fluid was expressed.  A Band-Aid was then applied.  She will remove this tonight.  Antibiotic ointment had also been applied prior to Band-Aid application.  She was informed these often recur and are benign.  Return in about 3 months (around 03/23/2023) for [facility].   Clerance Lav, DPM, FACFAS [facility]     [address], [place] [address]  Office [phone]  Fax [phone]

## 2022-12-22 ENCOUNTER — Other Ambulatory Visit: Payer: Self-pay

## 2022-12-22 ENCOUNTER — Encounter (HOSPITAL_COMMUNITY): Payer: Self-pay

## 2022-12-22 ENCOUNTER — Encounter (HOSPITAL_COMMUNITY): Payer: Self-pay | Admitting: General Surgery

## 2022-12-22 ENCOUNTER — Encounter (HOSPITAL_COMMUNITY)
Admission: RE | Admit: 2022-12-22 | Discharge: 2022-12-22 | Disposition: A | Discharge status: Home / Self Care | Payer: Medicare Other | Source: Ambulatory Visit | Attending: General Surgery | Admitting: General Surgery

## 2022-12-22 VITALS — BP 133/65 | HR 65 | Temp 98.2°F | Resp 16 | Ht 66.0 in | Wt 199.0 lb

## 2022-12-22 DIAGNOSIS — I10 Essential (primary) hypertension: Secondary | ICD-10-CM | POA: Diagnosis not present

## 2022-12-22 DIAGNOSIS — Z794 Long term (current) use of insulin: Secondary | ICD-10-CM | POA: Insufficient documentation

## 2022-12-22 DIAGNOSIS — Z01818 Encounter for other preprocedural examination: Secondary | ICD-10-CM

## 2022-12-22 DIAGNOSIS — E119 Type 2 diabetes mellitus without complications: Secondary | ICD-10-CM | POA: Diagnosis not present

## 2022-12-22 HISTORY — DX: Depression, unspecified: F32.A

## 2022-12-22 HISTORY — DX: Anemia, unspecified: D64.9

## 2022-12-22 HISTORY — DX: Pneumonia, unspecified organism: J18.9

## 2022-12-22 HISTORY — DX: Anxiety disorder, unspecified: F41.9

## 2022-12-22 LAB — BASIC METABOLIC PANEL
Anion gap: 8 (ref 5–15)
BUN: 19 mg/dL (ref 8–23)
CO2: 25 mmol/L (ref 22–32)
Calcium: 9.4 mg/dL (ref 8.9–10.3)
Chloride: 101 mmol/L (ref 98–111)
Creatinine, Ser: 0.82 mg/dL (ref 0.44–1.00)
GFR, Estimated: >60 mL/min (ref >60)
Glucose, Bld: 74 mg/dL (ref 70–99)
Potassium: 4.3 mmol/L (ref 3.5–5.1)
Sodium: 134 mmol/L — ABNORMAL LOW (ref 135–145)

## 2022-12-22 LAB — CBC
HCT: 32.5 % — ABNORMAL LOW (ref 36.0–46.0)
Hemoglobin: 10.2 g/dL — ABNORMAL LOW (ref 12.0–15.0)
MCH: 30.2 pg (ref 26.0–34.0)
MCHC: 31.4 g/dL (ref 30.0–36.0)
MCV: 96.2 fL (ref 80.0–100.0)
Platelets: 198 10*3/uL (ref 150–400)
RBC: 3.38 MIL/uL — ABNORMAL LOW (ref 3.87–5.11)
RDW: 17.4 % — ABNORMAL HIGH (ref 11.5–15.5)
WBC: 12.2 10*3/uL — ABNORMAL HIGH (ref 4.0–10.5)
nRBC: 0 % (ref 0.0–0.2)

## 2022-12-22 LAB — GLUCOSE, CAPILLARY: Glucose-Capillary: 72 mg/dL (ref 70–99)

## 2022-12-22 LAB — HEMOGLOBIN A1C
Hgb A1c MFr Bld: 5.8 % — ABNORMAL HIGH (ref 4.8–5.6)
Mean Plasma Glucose: 119.76 mg/dL

## 2022-12-23 ENCOUNTER — Ambulatory Visit (HOSPITAL_COMMUNITY): Payer: Medicare Other | Admitting: Anesthesiology

## 2022-12-23 ENCOUNTER — Encounter (HOSPITAL_COMMUNITY)
Admission: RE | Disposition: A | Discharge status: Home / Self Care | Payer: Self-pay | Source: Home / Self Care | Attending: General Surgery

## 2022-12-23 ENCOUNTER — Ambulatory Visit (HOSPITAL_BASED_OUTPATIENT_CLINIC_OR_DEPARTMENT_OTHER): Payer: Medicare Other | Admitting: Anesthesiology

## 2022-12-23 ENCOUNTER — Encounter (HOSPITAL_COMMUNITY): Payer: Self-pay | Admitting: General Surgery

## 2022-12-23 ENCOUNTER — Other Ambulatory Visit: Payer: Self-pay

## 2022-12-23 ENCOUNTER — Ambulatory Visit (HOSPITAL_COMMUNITY)
Admission: RE | Admit: 2022-12-23 | Discharge: 2022-12-23 | Disposition: A | Discharge status: Home / Self Care | Payer: Medicare Other | Attending: General Surgery | Admitting: General Surgery

## 2022-12-23 DIAGNOSIS — K8020 Calculus of gallbladder without cholecystitis without obstruction: Secondary | ICD-10-CM | POA: Diagnosis not present

## 2022-12-23 DIAGNOSIS — I10 Essential (primary) hypertension: Secondary | ICD-10-CM | POA: Diagnosis not present

## 2022-12-23 DIAGNOSIS — Z794 Long term (current) use of insulin: Secondary | ICD-10-CM | POA: Diagnosis not present

## 2022-12-23 DIAGNOSIS — K8010 Calculus of gallbladder with chronic cholecystitis without obstruction: Secondary | ICD-10-CM | POA: Insufficient documentation

## 2022-12-23 DIAGNOSIS — K449 Diaphragmatic hernia without obstruction or gangrene: Secondary | ICD-10-CM | POA: Insufficient documentation

## 2022-12-23 DIAGNOSIS — E119 Type 2 diabetes mellitus without complications: Secondary | ICD-10-CM

## 2022-12-23 HISTORY — PX: CHOLECYSTECTOMY: SHX55

## 2022-12-23 LAB — GLUCOSE, CAPILLARY
Glucose-Capillary: 116 mg/dL — ABNORMAL HIGH (ref 70–99)
Glucose-Capillary: 120 mg/dL — ABNORMAL HIGH (ref 70–99)

## 2022-12-23 SURGERY — LAPAROSCOPIC CHOLECYSTECTOMY WITH INTRAOPERATIVE CHOLANGIOGRAM
Anesthesia: General

## 2022-12-23 MED ORDER — KETOROLAC TROMETHAMINE 15 MG/ML IJ SOLN
15.0000 mg | Freq: Once | INTRAMUSCULAR | Status: AC
  Administered 2022-12-23: 15 mg via INTRAVENOUS

## 2022-12-23 MED ORDER — BUPIVACAINE HCL (PF) 0.25 % IJ SOLN
INTRAMUSCULAR | Status: DC | PRN
  Administered 2022-12-23: 20 mL

## 2022-12-23 MED ORDER — LIDOCAINE HCL (PF) 2 % IJ SOLN
INTRAMUSCULAR | Status: AC
  Filled 2022-12-23: qty 10

## 2022-12-23 MED ORDER — FENTANYL CITRATE PF 50 MCG/ML IJ SOSY
PREFILLED_SYRINGE | INTRAMUSCULAR | Status: AC
  Filled 2022-12-23: qty 1

## 2022-12-23 MED ORDER — EPHEDRINE 5 MG/ML INJ
INTRAVENOUS | Status: AC
  Filled 2022-12-23: qty 5

## 2022-12-23 MED ORDER — PROPOFOL 10 MG/ML IV BOLUS
INTRAVENOUS | Status: AC
  Filled 2022-12-23: qty 20

## 2022-12-23 MED ORDER — ONDANSETRON HCL 4 MG/2ML IJ SOLN
INTRAMUSCULAR | Status: DC | PRN
  Administered 2022-12-23: 4 mg via INTRAVENOUS

## 2022-12-23 MED ORDER — PROPOFOL 10 MG/ML IV BOLUS
INTRAVENOUS | Status: DC | PRN
  Administered 2022-12-23: 130 mg via INTRAVENOUS

## 2022-12-23 MED ORDER — FENTANYL CITRATE PF 50 MCG/ML IJ SOSY
25.0000 ug | PREFILLED_SYRINGE | INTRAMUSCULAR | Status: DC | PRN
  Administered 2022-12-23: 50 ug via INTRAVENOUS

## 2022-12-23 MED ORDER — HEMOSTATIC AGENTS (NO CHARGE) OPTIME
TOPICAL | Status: DC | PRN
  Administered 2022-12-23: 1 via TOPICAL

## 2022-12-23 MED ORDER — PHENYLEPHRINE 80 MCG/ML (10ML) SYRINGE FOR IV PUSH (FOR BLOOD PRESSURE SUPPORT)
PREFILLED_SYRINGE | INTRAVENOUS | Status: DC | PRN
  Administered 2022-12-23 (×3): 80 ug via INTRAVENOUS

## 2022-12-23 MED ORDER — FENTANYL CITRATE PF 50 MCG/ML IJ SOSY
25.0000 ug | PREFILLED_SYRINGE | INTRAMUSCULAR | Status: DC | PRN
  Administered 2022-12-23 (×3): 50 ug via INTRAVENOUS

## 2022-12-23 MED ORDER — 0.9 % SODIUM CHLORIDE (POUR BTL) OPTIME
TOPICAL | Status: DC | PRN
  Administered 2022-12-23: 1000 mL

## 2022-12-23 MED ORDER — GLYCOPYRROLATE PF 0.2 MG/ML IJ SOSY
PREFILLED_SYRINGE | INTRAMUSCULAR | Status: DC | PRN
  Administered 2022-12-23: .2 mg via INTRAVENOUS

## 2022-12-23 MED ORDER — ONDANSETRON HCL 4 MG/2ML IJ SOLN
INTRAMUSCULAR | Status: AC
  Filled 2022-12-23: qty 4

## 2022-12-23 MED ORDER — FENTANYL CITRATE (PF) 100 MCG/2ML IJ SOLN
INTRAMUSCULAR | Status: DC | PRN
  Administered 2022-12-23: 100 ug via INTRAVENOUS

## 2022-12-23 MED ORDER — KETOROLAC TROMETHAMINE 15 MG/ML IJ SOLN
INTRAMUSCULAR | Status: AC
  Filled 2022-12-23: qty 1

## 2022-12-23 MED ORDER — SODIUM CHLORIDE 0.9 % IV SOLN
2.0000 g | Freq: Once | INTRAVENOUS | Status: AC
  Administered 2022-12-23: 2 g via INTRAVENOUS
  Filled 2022-12-23: qty 2

## 2022-12-23 MED ORDER — DEXAMETHASONE SODIUM PHOSPHATE 10 MG/ML IJ SOLN
INTRAMUSCULAR | Status: DC | PRN
  Administered 2022-12-23: 5 mg via INTRAVENOUS

## 2022-12-23 MED ORDER — AMISULPRIDE (ANTIEMETIC) 5 MG/2ML IV SOLN: 10.0000 mg | Freq: Once | INTRAVENOUS | Status: DC | PRN

## 2022-12-23 MED ORDER — ROCURONIUM BROMIDE 10 MG/ML (PF) SYRINGE
PREFILLED_SYRINGE | INTRAVENOUS | Status: AC
  Filled 2022-12-23: qty 20

## 2022-12-23 MED ORDER — FENTANYL CITRATE PF 50 MCG/ML IJ SOSY
PREFILLED_SYRINGE | INTRAMUSCULAR | Status: AC
  Filled 2022-12-23: qty 2

## 2022-12-23 MED ORDER — LACTATED RINGERS IV SOLN: INTRAVENOUS | Status: DC

## 2022-12-23 MED ORDER — ACETAMINOPHEN 10 MG/ML IV SOLN
1000.0000 mg | Freq: Once | INTRAVENOUS | Status: DC | PRN
  Administered 2022-12-23: 1000 mg via INTRAVENOUS

## 2022-12-23 MED ORDER — INDOCYANINE GREEN 25 MG IV SOLR
1.2500 mg | Freq: Once | INTRAVENOUS | Status: AC
  Administered 2022-12-23: 1.25 mg via TOPICAL

## 2022-12-23 MED ORDER — SUGAMMADEX SODIUM 200 MG/2ML IV SOLN
INTRAVENOUS | Status: DC | PRN
  Administered 2022-12-23: 200 mg via INTRAVENOUS

## 2022-12-23 MED ORDER — GLYCOPYRROLATE 0.2 MG/ML IJ SOLN
INTRAMUSCULAR | Status: AC
  Filled 2022-12-23: qty 2

## 2022-12-23 MED ORDER — FENTANYL CITRATE (PF) 100 MCG/2ML IJ SOLN
INTRAMUSCULAR | Status: AC
  Filled 2022-12-23: qty 2

## 2022-12-23 MED ORDER — BUPIVACAINE HCL (PF) 0.25 % IJ SOLN
INTRAMUSCULAR | Status: AC
  Filled 2022-12-23: qty 30

## 2022-12-23 MED ORDER — ROCURONIUM BROMIDE 10 MG/ML (PF) SYRINGE
PREFILLED_SYRINGE | INTRAVENOUS | Status: DC | PRN
  Administered 2022-12-23: 60 mg via INTRAVENOUS

## 2022-12-23 MED ORDER — CHLORHEXIDINE GLUCONATE 0.12 % MT SOLN
15.0000 mL | Freq: Once | OROMUCOSAL | Status: AC
  Administered 2022-12-23: 15 mL via OROMUCOSAL

## 2022-12-23 MED ORDER — ORAL CARE MOUTH RINSE: 15.0000 mL | Freq: Once | OROMUCOSAL | Status: AC

## 2022-12-23 MED ORDER — LIDOCAINE 2% (20 MG/ML) 5 ML SYRINGE
INTRAMUSCULAR | Status: DC | PRN
  Administered 2022-12-23: 80 mg via INTRAVENOUS

## 2022-12-23 MED ORDER — ACETAMINOPHEN 10 MG/ML IV SOLN
INTRAVENOUS | Status: AC
  Filled 2022-12-23: qty 100

## 2022-12-23 MED ORDER — LACTATED RINGERS IR SOLN
Status: DC | PRN
  Administered 2022-12-23: 1000 mL

## 2022-12-23 MED ORDER — DEXAMETHASONE SODIUM PHOSPHATE 10 MG/ML IJ SOLN
INTRAMUSCULAR | Status: AC
  Filled 2022-12-23: qty 2

## 2022-12-23 MED ORDER — EPHEDRINE SULFATE-NACL 50-0.9 MG/10ML-% IV SOSY
PREFILLED_SYRINGE | INTRAVENOUS | Status: DC | PRN
  Administered 2022-12-23: 10 mg via INTRAVENOUS
  Administered 2022-12-23: 5 mg via INTRAVENOUS
  Administered 2022-12-23: 10 mg via INTRAVENOUS

## 2022-12-23 SURGICAL SUPPLY — 56 items
ADH SKN CLS APL DERMABOND .7 (GAUZE/BANDAGES/DRESSINGS) ×1
APL PRP STRL LF DISP 70% ISPRP (MISCELLANEOUS) ×1
APL SRG 38 LTWT LNG FL B (MISCELLANEOUS)
APPLICATOR ARISTA FLEXITIP XL (MISCELLANEOUS) IMPLANT
APPLIER CLIP 5 13 M/L LIGAMAX5 (MISCELLANEOUS) ×1
APPLIER CLIP ROT 10 11.4 M/L (STAPLE)
APR CLP MED LRG 11.4X10 (STAPLE)
APR CLP MED LRG 5 ANG JAW (MISCELLANEOUS) ×1
BAG COUNTER SPONGE SURGICOUNT (BAG) IMPLANT
BAG SPEC RTRVL 10 TROC 200 (ENDOMECHANICALS) ×1
BAG SPNG CNTER NS LX DISP (BAG)
CABLE HIGH FREQUENCY MONO STRZ (ELECTRODE) ×1 IMPLANT
CHLORAPREP W/TINT 26 (MISCELLANEOUS) ×1 IMPLANT
CLIP APPLIE 5 13 M/L LIGAMAX5 (MISCELLANEOUS) IMPLANT
CLIP APPLIE ROT 10 11.4 M/L (STAPLE) IMPLANT
CLIP LIGATING HEMO O LOK GREEN (MISCELLANEOUS) IMPLANT
COVER MAYO STAND XLG (MISCELLANEOUS) IMPLANT
COVER SURGICAL LIGHT HANDLE (MISCELLANEOUS) ×1 IMPLANT
DERMABOND ADVANCED .7 DNX12 (GAUZE/BANDAGES/DRESSINGS) IMPLANT
DRAPE C-ARM 42X120 X-RAY (DRAPES) IMPLANT
DRSG TEGADERM 2-3/8X2-3/4 SM (GAUZE/BANDAGES/DRESSINGS) ×3 IMPLANT
DRSG TEGADERM 4X4.75 (GAUZE/BANDAGES/DRESSINGS) ×1 IMPLANT
ELECT REM PT RETURN 15FT ADLT (MISCELLANEOUS) ×1 IMPLANT
GAUZE SPONGE 2X2 8PLY STRL LF (GAUZE/BANDAGES/DRESSINGS) ×1 IMPLANT
GLOVE BIO SURGEON STRL SZ7.5 (GLOVE) ×1 IMPLANT
GLOVE INDICATOR 8.0 STRL GRN (GLOVE) ×1 IMPLANT
GOWN STRL REUS W/ TWL XL LVL3 (GOWN DISPOSABLE) ×1 IMPLANT
GOWN STRL REUS W/TWL XL LVL3 (GOWN DISPOSABLE) ×1
GRASPER SUT TROCAR 14GX15 (MISCELLANEOUS) IMPLANT
HEMOSTAT ARISTA ABSORB 3G PWDR (HEMOSTASIS) IMPLANT
HEMOSTAT SNOW SURGICEL 2X4 (HEMOSTASIS) IMPLANT
IRRIG SUCT STRYKERFLOW 2 WTIP (MISCELLANEOUS) ×1
IRRIGATION SUCT STRKRFLW 2 WTP (MISCELLANEOUS) ×1 IMPLANT
KIT BASIN OR (CUSTOM PROCEDURE TRAY) ×1 IMPLANT
KIT TURNOVER KIT A (KITS) IMPLANT
L-HOOK LAP DISP 36CM (ELECTROSURGICAL)
LHOOK LAP DISP 36CM (ELECTROSURGICAL) IMPLANT
POUCH RETRIEVAL ECOSAC 10 (ENDOMECHANICALS) ×1 IMPLANT
SCISSORS LAP 5X35 DISP (ENDOMECHANICALS) ×1 IMPLANT
SET CHOLANGIOGRAPH MIX (MISCELLANEOUS) IMPLANT
SET TUBE SMOKE EVAC HIGH FLOW (TUBING) ×1 IMPLANT
SLEEVE ADV FIXATION 5X100MM (TROCAR) IMPLANT
SLEEVE Z-THREAD 5X100MM (TROCAR) ×2 IMPLANT
SPIKE FLUID TRANSFER (MISCELLANEOUS) ×1 IMPLANT
STRIP CLOSURE SKIN 1/2X4 (GAUZE/BANDAGES/DRESSINGS) ×1 IMPLANT
SUT MNCRL AB 4-0 PS2 18 (SUTURE) ×1 IMPLANT
SUT VIC AB 0 UR5 27 (SUTURE) IMPLANT
SUT VICRYL 0 TIES 12 18 (SUTURE) IMPLANT
SUT VICRYL 0 UR6 27IN ABS (SUTURE) IMPLANT
TOWEL OR 17X26 10 PK STRL BLUE (TOWEL DISPOSABLE) ×1 IMPLANT
TOWEL OR NON WOVEN STRL DISP B (DISPOSABLE) ×1 IMPLANT
TRAY LAPAROSCOPIC (CUSTOM PROCEDURE TRAY) ×1 IMPLANT
TROCAR 11X100 Z THREAD (TROCAR) IMPLANT
TROCAR ADV FIXATION 5X100MM (TROCAR) IMPLANT
TROCAR BALLN 12MMX100 BLUNT (TROCAR) ×1 IMPLANT
TROCAR Z-THREAD OPTICAL 5X100M (TROCAR) ×1 IMPLANT

## 2022-12-23 NOTE — Transfer of Care (Signed)
Immediate Anesthesia Transfer of Care Note  Patient: [name] [name]  Procedure(s) Performed: Procedure(s): LAPAROSCOPIC CHOLECYSTECTOMY WITH ICG DYE (N/A)  Patient Location: PACU  Anesthesia Type:General  Level of Consciousness:  sedated, patient cooperative and responds to stimulation  Airway & Oxygen Therapy:Patient Spontanous Breathing and Patient connected to face mask oxgen  Post-op Assessment:  Report given to PACU RN and Post -op Vital signs reviewed and stable  Post vital signs:  Reviewed and stable  Last Vitals:  Vitals:   12/23/22 0653  BP: (!) 142/98  Pulse: 64  Resp: 15  Temp: 36.8 C  SpO2: 98%    Complications: No apparent anesthesia complications

## 2022-12-23 NOTE — Progress Notes (Signed)
Dr Wilson paged for surgery orders 

## 2022-12-23 NOTE — H&P (Signed)
REFERRING PHYSICIAN: Particia Lather, MD  PROVIDER: Carel Schnee Sherril Cong, MD  MRN: [id number] DOB: 1946/02/14 DATE OF ENCOUNTER: 12/11/2022  Subjective  Chief Complaint: New Consultation ( eval cholecystectomy)   History of Present Illness: TACY BREDESEN is a 77 y.o. female who is seen today as an office consultation at the request of Dr. Leonides Schanz for evaluation of New Consultation ( eval cholecystectomy) .  She was seen in the GI office in late March with complaints of epigastric abdominal discomfort for about 6 months but it had acutely worsened. She told them it was not affected by bowel movements or food intake. She reported that she had intentionally lost about 65 pounds over the course of the year. She complained that it felt like her abdomen was swollen. She also reported several episodes of vomiting. Most of the time it was with dry heaves with a small amount of food. She also endorsed some worsening heartburn along with chest burning and regurgitation. She reported taking 1 dose of Mounjaro a few months ago but was not able to tolerate it due to nausea. She also endorsed some dysphagia mostly to solid foods which happens a few times a week.  She had presented to the emergency room earlier in March on the 13th with complaints of epigastric discomfort pain and vomiting. Cardiac workup was negative. CBC showed a mildly elevated white count of 12.2. Lipase and LFTs were normal. CT abdomen pelvis showed some thickened appearance of the gastric mucosa and atrophy of the pancreas  Today she is in. By her her husband. She states that she has been having a lot of stomach problems for the past year or so. She complains of "stomach ache "most of the time. She points to her mid abdomen and sometimes even to her suprapubic area. She states that she had an severe episode of pressure in her upper abdomen and points to her epigastric area associate with nausea and vomiting which prompted her to go to the  emergency room. She states there is no particular pattern to it. However it may occur more commonly after eating. She reports better swallowing after her esophagus was stretched. She states that she has bowel movements but her stools are in stool balls.    Review of Systems: A complete review of systems was obtained from the patient. I have reviewed this information and discussed as appropriate with the patient. See HPI as well for other ROS.  ROS  Medical History: Past Medical History: Diagnosis Date Anxiety Arthritis Diabetes mellitus without complication (CMS/HHS-HCC) GERD (gastroesophageal reflux disease) Hypertension Sleep apnea  There is no problem list on file for this patient.  Past Surgical History: Procedure Laterality Date JOINT REPLACEMENT   Allergies Allergen Reactions Sulfa (Sulfonamide Antibiotics) Hives  Current Outpatient Medications on File Prior to Visit Medication Sig Dispense Refill budesonide 9 mg TaDE carvediloL (COREG) 6.25 MG tablet CERTOLIZUMAB PEGOL SUBQ Inject subcutaneously LEVEMIR FLEXPEN pen injector (concentration 100 units/mL) INJECT 25 UNITS UNDER THE SKIN TWICE DAILY lisinopriL (ZESTRIL) 20 MG tablet Take 20 mg by mouth once daily metFORMIN (GLUCOPHAGE-XR) 500 MG XR tablet Take 1 tablet by mouth 2 (two) times daily methotrexate (RHEUMATREX) 2.5 MG tablet omeprazole (PRILOSEC) 40 MG DR capsule potassium chloride (KLOR-CON M10) 10 mEq ER tablet Take by mouth rosuvastatin (CRESTOR) 5 MG tablet  No current facility-administered medications on file prior to visit.  Family History Problem Relation Age of Onset Stroke Mother High blood pressure (Hypertension) Mother Coronary Artery Disease (Blocked arteries around heart)  Mother Deep vein thrombosis (DVT or abnormal blood clot formation) Mother Skin cancer Father Stroke Father   Social History  Tobacco Use Smoking Status Never Smokeless Tobacco Never   Social  History  Socioeconomic History Marital status: Divorced Tobacco Use Smoking status: Never Smokeless tobacco: Never Substance and Sexual Activity Alcohol use: Yes Comment: 2x a month Drug use: Never  Social Determinants of Health  Financial Resource Strain: Low Risk (10/01/2022) Received from [facility] Overall Financial Resource Strain (CARDIA) Difficulty of Paying Living Expenses: Not hard at all Food Insecurity: No Food Insecurity (10/01/2022) Received from [facility] Hunger Vital Sign Worried About Running Out of Food in the Last Year: Never true Ran Out of Food in the Last Year: Never true Transportation Needs: No Transportation Needs (10/01/2022) Received from [facility] - Transportation Lack of Transportation (Medical): No Lack of Transportation (Non-Medical): No Physical Activity: Insufficiently Active (10/01/2022) Received from [facility] Exercise Vital Sign Days of Exercise per Week: 2 days Minutes of Exercise per Session: 60 min Stress: No Stress Concern Present (10/01/2022) Received from [facility] of Occupational Health - Occupational Stress Questionnaire Feeling of Stress : Not at all Social Connections: Socially Integrated (10/01/2022) Received from [facility] Social Connection and Isolation Panel [NHANES] Frequency of Communication with Friends and Family: Twice a week Frequency of Social Gatherings with Friends and Family: Twice a week Attends Religious Services: More than 4 times per year Active Member of [employer] or Organizations: Yes Attends Engineer, structural: More than 4 times per year Marital Status: Living with partner  Objective:  Vitals: 12/11/22 0926 BP: 137/78 Pulse: 75 Temp: 36.7 C (98 F) SpO2: 96% Weight: 89.4 kg (197 lb) Height: 167.6 cm (5\' 6" )  Body mass index is 31.8 kg/m.  Constitutional: NAD; conversant; no deformities Eyes: Moist conjunctiva; no lid lag; anicteric; PERRL Neck:  Trachea midline; no thyromegaly Lungs: Normal respiratory effort; no tactile fremitus CV: RRR; no palpable thrills; no pitting edema GI: Abd soft, nontender, nondistended; no palpable hepatosplenomegaly MSK: abNormal gait; no clubbing/cyanosis Psychiatric: Appropriate affect; alert and oriented x3 Lymphatic: No palpable cervical or axillary lymphadenopathy Skin: No rash, lesions or jaundice  Labs, Imaging and Diagnostic Testing: GI office note 10/29/22  EUS 11/09/22  Ab U/S 10/03/18: Mild fatty liver. Cholelithiasis without findings of acute cholecystitis. Extra hepatic bile ducts and pancreatic tail are obscured.  CT A/P w/contrast 11/02/18: IMPRESSION: 1. Heterogeneous fatty replacement throughout the pancreas, without pancreatic mass or acute process. 2. Possible constipation. 3. Hepatic steatosis and hepatomegaly. 4. Tiny hiatal hernia. 5. Aortic Atherosclerosis (ICD10-I70.0). 6. Degraded evaluation of the pelvis, secondary to beam hardening artifact from right hip arthroplasty. 7. Uterine fibroid.  CT A/P w/o contrast 07/12/20: IMPRESSION: 1. Diverticulosis without diverticulitis. 2. No acute intra-abdominal or intrapelvic process. 3. Aortic Atherosclerosis (ICD10-I70.0).  Barium swallow 09/27/20: IMPRESSION: 1. Proximal anterior esophageal web, nonobstructive to passage of a 13 mm barium tablet. 2. Otherwise unremarkable double contrast barium esophagram.  CT A/P w/contrast 10/21/22: IMPRESSION: 1. No definite acute CT findings of the abdomen or pelvis to explain abdominal discomfort or vomiting. No evidence of bowel obstruction. 2. 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 3. Diffusely atrophic appearance of the pancreas. No pancreatic ductal dilatation or surrounding inflammatory changes. Aortic Atherosclerosis (ICD10-I70.0).  EGD  09/17/21: Findings: - Multiple mild (non-circumferential scarring) stenoses were found in the entire esophagus. The stenoses were traversed. A guidewire was  placed and the scope was withdrawn. Dilation was performed with a Savary dilator with no resistance at 15 mm and mild resistance at 16 mm. The dilation site was examined and showed mild mucosal disruption in the upper esophagus. - A small hiatal hernia was present. - Localized mild inflammation characterized by congestion (edema), erythema and granularity was found in the gastric body and in the gastric antrum. Biopsies were taken with a cold forceps for histology. - Multiple sessile polyps with no bleeding and no stigmata of recent bleeding were found in the gastric body. Biopsies were taken with a cold forceps for histology. - The examined duodenum was normal. Impression: - Mild esophageal stenoses. Dilated. - Small hiatal hernia. - Gastritis. Biopsied. - Multiple gastric polyps. Biopsied. - Normal examined duodenum. Path: 1. Surgical [P], gastric - ANTRAL AND OXYNTIC MUCOSA WITH SLIGHT CHRONIC INFLAMMATION. - NO HELICOBACTER PYLORI IDENTIFIED. 2. Surgical [P], gastric polyps - FUNDIC GLAND POLYPS.  Colonoscopy 09/17/21: - One 12 mm polyp in the cecum, removed with a hot snare. Resected and retrieved. - Seven 3 to 8 mm polyps in the transverse colon and in the ascending colon, removed with a cold snare. Resected and retrieved. - Diverticulosis in the sigmoid colon and in the descending colon. - One 7 mm polyp in the rectum, removed with a cold snare. Resected and retrieved. - Non-bleeding internal hemorrhoids. Path: 3. Surgical [P], colon, cecum, polyp (1) - TUBULAR ADENOMA WITHOUT HIGH GRADE DYSPLASIA. 4. Surgical [P], colon, ascending, transverse, polyp (7) - TUBULAR ADENOMA (7) WITHOUT HIGH GRADE DYSPLASIA. 5. Surgical [P], colon, random sites - UNREMARKABLE COLONIC MUCOSA. - NO MICROSCOPIC COLITIS, ACTIVE INFLAMMATION OR  CHRONIC CHANGES. 6. Surgical [P], colon, rectum, polyp (1) - TUBULOVILLOUS ADENOMA WITHOUT HIGH GRADE DYPLASIA.  GASTRIC EMPTYING STUDY 12/01/22 NML  CMET, AMYLASE, LIPASE 11/09/22 - OK  CARDIAC ECHO 02/06/20 Excellent LV function with EF 65 to 70%. Grade 1 diastolic dysfunction. Mild mitral regurgitation.  Assessment and Plan:   Diagnoses and all orders for this visit:  Symptomatic cholelithiasis  Hiatal hernia  History of esophageal stricture    She complains of abdominal discomfort in several different locations. Most recently it has been in the upper abdomen associated with nausea and vomiting. She has had CT imaging, upper endoscopy/EUS and a gastric emptying study. She is found to have gallstones. I do think a component of her GI complaints could be contributed to her gallbladder. However we did discuss that cholecystectomy may not ameliorate all of her abdominal symptoms.  I believe some of the patient's symptoms are consistent with gallbladder disease.  We discussed gallbladder disease. The patient was given Agricultural engineer. We discussed non-operative and operative management. We discussed the signs & symptoms of acute cholecystitis  I discussed laparoscopic cholecystectomy with possible IOC in detail. The patient was given educational material as well as diagrams detailing the procedure. We discussed the risks and benefits of a laparoscopic cholecystectomy including, but not limited to bleeding, infection, injury to surrounding structures such as the intestine or liver, bile leak, retained gallstones, need to convert to an open procedure, prolonged diarrhea, blood clots such as DVT, common bile duct injury, anesthesia risks, and possible need for additional procedures. We discussed the typical post-operative recovery course. I explained that the likelihood of improvement of their symptoms is fair to good.  SHe would like to proceed with surgery. Think that is very  reasonable.  Daughter is one of the gastroenterologist in town  This patient encounter took 45 minutes today  to perform the following: take history, perform exam, review outside records, interpret imaging, counsel the patient on their diagnosis and document encounter, findings & plan in the EHR  No follow-ups on file.  Indy Prestwood Sherril Cong, MD General, Minimally Invasive, & Bariatric Surgery     Electronically signed by Gara Kroner, MD at 12/11/2022 1:03 PM EDT

## 2022-12-23 NOTE — Interval H&P Note (Signed)
History and Physical Interval Note:  12/23/2022 7:44 AM  [name] [name]  has presented today for surgery, with the diagnosis of SYMPTOMATIC CHOLELITHIASIS.  The various methods of treatment have been discussed with the patient and family. After consideration of risks, benefits and other options for treatment, the patient has consented to  Procedure(s): LAPAROSCOPIC CHOLECYSTECTOMY WITH ICG DYE (N/A) as a surgical intervention.  The patient's history has been reviewed, patient examined, no change in status, stable for surgery.  I have reviewed the patient's chart and labs.  Questions were answered to the patient's satisfaction.     Gaynelle Adu

## 2022-12-23 NOTE — Discharge Instructions (Signed)
LAPAROSCOPIC SURGERY: POST OP INSTRUCTIONS Always review your discharge instruction sheet given to you by the facility where your surgery was performed. IF YOU HAVE DISABILITY OR FAMILY LEAVE FORMS, YOU MUST BRING THEM TO THE OFFICE FOR PROCESSING.   DO NOT GIVE THEM TO YOUR DOCTOR.  PAIN CONTROL  First take acetaminophen (Tylenol) AND/or ibuprofen (Advil) to control your pain after surgery.  Follow directions on package.  Taking acetaminophen (Tylenol) and/or ibuprofen (Advil) regularly after surgery will help to control your pain and lower the amount of prescription pain medication you may need.  You should not take more than 3,000 mg (3 grams) of acetaminophen (Tylenol) in 24 hours.  You should not take ibuprofen (Advil), aleve, motrin, naprosyn or other NSAIDS if you have a history of stomach ulcers or chronic kidney disease.  Do not take NSAIDs on an empty stomach - take with food. A prescription for pain medication may be given to you upon discharge.  Take your pain medication as prescribed, if you still have uncontrolled pain after taking acetaminophen (Tylenol) or ibuprofen (Advil). Use ice packs to help control pain. If you need a refill on your pain medication, please contact your pharmacy.  They will contact our office to request authorization. Prescriptions will not be filled after 5pm or on week-ends.  HOME MEDICATIONS Take your usually prescribed medications unless otherwise directed.  DIET You should follow a light diet the first few days after arrival home.  Be sure to include lots of fluids daily. Avoid fatty, fried foods.   CONSTIPATION It is common to experience some constipation after surgery and if you are taking pain medication.  Increasing fluid intake and taking a stool softener (such as Colace) will usually help or prevent this problem from occurring.  A mild laxative (Milk of Magnesia or Miralax) should be taken according to package instructions if there are no bowel  movements after 48 hours.  WOUND/INCISION CARE Most patients will experience some swelling and bruising in the area of the incisions.  Ice packs will help.  Swelling and bruising can take several days to resolve.  Unless discharge instructions indicate otherwise, follow guidelines below  STERI-STRIPS - you may remove your outer bandages 48 hours after surgery, and you may shower at that time.  You have steri-strips (small skin tapes) in place directly over the incision.  These strips should be left on the skin for 7-10 days.   DERMABOND/SKIN GLUE - you may shower in 24 hours.  The glue will flake off over the next 2-3 weeks. Any sutures or staples will be removed at the office during your follow-up visit.  ACTIVITIES You may resume regular (light) daily activities beginning the next day--such as daily self-care, walking, climbing stairs--gradually increasing activities as tolerated.  You may have sexual intercourse when it is comfortable.  Refrain from any heavy lifting or straining until approved by your doctor. You may drive when you are no longer taking prescription pain medication, you can comfortably wear a seatbelt, and you can safely maneuver your car and apply brakes.  FOLLOW-UP You should see your doctor in the office for a follow-up appointment approximately 2-3 weeks after your surgery.  You should have been given your post-op/follow-up appointment when your surgery was scheduled.  If you did not receive a post-op/follow-up appointment, make sure that you call for this appointment within a day or two after you arrive home to insure a convenient appointment time.  OTHER INSTRUCTIONS   WHEN TO CALL YOUR DOCTOR:  Fever over 101.0 Inability to urinate Continued bleeding from incision. Increased pain, redness, or drainage from the incision. Increasing abdominal pain  The clinic staff is available to answer your questions during regular business hours.  Please don't hesitate to call and  ask to speak to one of the nurses for clinical concerns.  If you have a medical emergency, go to the nearest emergency room or call 911.  A surgeon from [facility] is always on call at the hospital. [address] [place], [place], [place]  [address] ? [address] [place], [place], [place]   [address] [phone] ? [phone] ? FAX [phone] Web site: [email or web address]

## 2022-12-23 NOTE — Op Note (Signed)
[name] [name] [id number] 06-01-1946 12/23/2022  Laparoscopic Cholecystectomy with near infrared fluorescent cholangiography procedure Note  Indications: This patient presents with epigastric abdominal pain and will undergo laparoscopic cholecystectomy.  Pre-operative Diagnosis: symptomatic cholelithiasis  Post-operative Diagnosis: same  Surgeon: Gaynelle Adu MD FACS  Assistants: none  Anesthesia: General endotracheal anesthesia  Procedure Details  The patient was seen again in the Holding Room. The risks, benefits, complications, treatment options, and expected outcomes were discussed with the patient. The possibilities of reaction to medication, pulmonary aspiration, perforation of viscus, bleeding, recurrent infection, finding a normal gallbladder, the need for additional procedures, failure to diagnose a condition, the possible need to convert to an open procedure, and creating a complication requiring transfusion or operation were discussed with the patient. The likelihood of improving the patient's symptoms with return to their baseline status is good.  The patient and/or family concurred with the proposed plan, giving informed consent. The site of surgery properly noted. The patient was taken to Operating Room, identified as [name] [name] and the procedure verified as Laparoscopic Cholecystectomy with ICG dye.  A Time Out was held and the above information confirmed. Antibiotic prophylaxis was administered.    ICG dye was administered preoperatively.    General endotracheal anesthesia was then administered and tolerated well. After the induction, the abdomen was prepped with Chloraprep and draped in the sterile fashion. The patient was positioned in the supine position.  Local anesthetic agent was injected into the skin near the umbilicus and an incision made. We dissected down to the abdominal fascia with blunt dissection.  The fascia was incised vertically and we entered the peritoneal  cavity bluntly.  A pursestring suture of 0-Vicryl was placed around the fascial opening.  The Hasson cannula was inserted and secured with the stay suture.  Pneumoperitoneum was then created with CO2 and tolerated well without any adverse changes in the patient's vital signs. An 5-mm port was placed in the subxiphoid position.  Two 5-mm ports were placed in the right upper quadrant. All skin incisions were infiltrated with a local anesthetic agent before making the incision and placing the trocars.   We positioned the patient in reverse Trendelenburg, tilted slightly to the patient's left.  The gallbladder was identified, the fundus grasped and retracted cephalad. Adhesions were lysed bluntly and with the electrocautery where indicated, taking care not to injure any adjacent organs or viscus. The infundibulum was grasped and retracted laterally, exposing the peritoneum overlying the triangle of Calot. This was then divided and exposed in a blunt fashion. A critical view of the cystic duct and cystic artery was obtained.  The cystic duct was clearly identified and bluntly dissected circumferentially.  Utilizing the Stryker camera system near infrared fluorescent activity was visualized in the liver, cystic duct, common hepatic duct and common bile duct and small bowel.  This served as a secondary confirmation of our anatomy.  The cystic duct was then ligated with clips and divided. The cystic artery which had been identified & dissected free was ligated with clips and divided as well.   The gallbladder was dissected from the liver bed in retrograde fashion with the electrocautery. The gallbladder was removed and placed in an Ecco sac.  The gallbladder and Ecco sac were then removed through the umbilical port site. The liver bed was irrigated and inspected. Hemostasis was achieved with the electrocautery. I did place a piece of surgical SNOW in the gallbladder fossa. Copious irrigation was utilized and was  repeatedly aspirated until  clear.  The pursestring suture was used to close the umbilical fascia.    We again inspected the right upper quadrant for hemostasis.  The umbilical closure was inspected and there was no air leak and nothing trapped within the closure. Pneumoperitoneum was released as we removed the trocars.  4-0 Monocryl was used to close the skin.   Dermabond was applied. The patient was then extubated and brought to the recovery room in stable condition. Instrument, sponge, and needle counts were correct at closure and at the conclusion of the case.   Findings: +critical view +snow Near infrared fluorescent cholangiography performed showing activity in cystic duct, chd, common bile duct and small bowel  Estimated Blood Loss: Minimal         Drains: none         Specimens: Gallbladder           Complications: None; patient tolerated the procedure well.         Disposition: PACU - hemodynamically stable.         Condition: stable  [name], MD, FACS General, Bariatric, & Minimally Invasive Surgery [facility],  [facility]

## 2022-12-23 NOTE — Anesthesia Procedure Notes (Signed)
Procedure Name: Intubation Date/Time: 12/23/2022 8:55 AM  Performed by: Theodosia Quay, CRNAPre-anesthesia Checklist: Patient identified, Emergency Drugs available, Suction available, Patient being monitored and Timeout performed Patient Re-evaluated:Patient Re-evaluated prior to induction Oxygen Delivery Method: Circle system utilized Preoxygenation: Pre-oxygenation with 100% oxygen Induction Type: IV induction Ventilation: Mask ventilation without difficulty and Oral airway inserted - appropriate to patient size Laryngoscope Size: Mac and 3 Grade View: Grade II Tube type: Oral Tube size: 7.0 mm Number of attempts: 1 Airway Equipment and Method: Stylet Placement Confirmation: ETT inserted through vocal cords under direct vision, positive ETCO2, CO2 detector and breath sounds checked- equal and bilateral Secured at: 21 cm Tube secured with: Tape Dental Injury: Teeth and Oropharynx as per pre-operative assessment  Comments: ATOI

## 2022-12-23 NOTE — Anesthesia Preprocedure Evaluation (Signed)
Anesthesia Evaluation  Patient identified by MRN, date of birth, ID band Patient awake    Reviewed: Allergy & Precautions, NPO status , Patient's Chart, lab work & pertinent test results  Airway Mallampati: II  TM Distance: >3 FB Neck ROM: Full    Dental   Pulmonary sleep apnea    breath sounds clear to auscultation       Cardiovascular hypertension, Pt. on medications and Pt. on home beta blockers + dysrhythmias  Rhythm:Regular Rate:Normal     Neuro/Psych  Neuromuscular disease    GI/Hepatic Neg liver ROS,GERD  Medicated,,  Endo/Other  diabetes, Type 2, Insulin Dependent    Renal/GU negative Renal ROS     Musculoskeletal  (+) Arthritis ,    Abdominal   Peds  Hematology  (+) Blood dyscrasia, anemia   Anesthesia Other Findings   Reproductive/Obstetrics                             Anesthesia Physical Anesthesia Plan  ASA: 2  Anesthesia Plan: General   Post-op Pain Management: Tylenol PO (pre-op)*   Induction: Intravenous  PONV Risk Score and Plan: 3 and Dexamethasone, Ondansetron and Treatment may vary due to age or medical condition  Airway Management Planned: Oral ETT  Additional Equipment:   Intra-op Plan:   Post-operative Plan: Extubation in OR  Informed Consent: I have reviewed the patients History and Physical, chart, labs and discussed the procedure including the risks, benefits and alternatives for the proposed anesthesia with the patient or authorized representative who has indicated his/her understanding and acceptance.     Dental advisory given  Plan Discussed with: CRNA  Anesthesia Plan Comments:        Anesthesia Quick Evaluation

## 2022-12-24 ENCOUNTER — Encounter (HOSPITAL_COMMUNITY): Payer: Self-pay | Admitting: General Surgery

## 2022-12-24 ENCOUNTER — Telehealth: Payer: Self-pay | Admitting: Cardiovascular Disease

## 2022-12-24 LAB — SURGICAL PATHOLOGY

## 2022-12-24 NOTE — Anesthesia Postprocedure Evaluation (Signed)
Anesthesia Post Note  Patient: [name] [name]  Procedure(s) Performed: LAPAROSCOPIC CHOLECYSTECTOMY WITH ICG DYE     Patient location during evaluation: PACU Anesthesia Type: General Level of consciousness: awake and alert Pain management: pain level controlled Vital Signs Assessment: post-procedure vital signs reviewed and stable Respiratory status: spontaneous breathing, nonlabored ventilation, respiratory function stable and patient connected to nasal cannula oxygen Cardiovascular status: blood pressure returned to baseline and stable Postop Assessment: no apparent nausea or vomiting Anesthetic complications: no   No notable events documented.  Last Vitals:  Vitals:   12/23/22 1116 12/23/22 1145  BP: 119/65 131/65  Pulse: 69 66  Resp: 16 18  Temp: 36.5 C 36.7 C  SpO2: 97% 95%    Last Pain:  Vitals:   12/23/22 1145  TempSrc:   PainSc: 5                  Kennieth Rad

## 2022-12-24 NOTE — Telephone Encounter (Signed)
Patient is requesting call back to discuss sleep machine that she received today and she would like to discuss the settings. Please advise.

## 2022-12-25 ENCOUNTER — Encounter: Payer: Self-pay | Admitting: Cardiovascular Disease

## 2022-12-26 ENCOUNTER — Observation Stay (HOSPITAL_COMMUNITY): Payer: Medicare Other

## 2022-12-26 ENCOUNTER — Emergency Department (HOSPITAL_COMMUNITY): Payer: Medicare Other

## 2022-12-26 ENCOUNTER — Inpatient Hospital Stay (HOSPITAL_COMMUNITY)
Admission: EM | Admit: 2022-12-26 | Discharge: 2022-12-30 | DRG: 394 | Disposition: A | Discharge status: Home / Self Care | Payer: Medicare Other | Attending: General Surgery | Admitting: General Surgery

## 2022-12-26 ENCOUNTER — Other Ambulatory Visit: Payer: Self-pay

## 2022-12-26 ENCOUNTER — Encounter (HOSPITAL_COMMUNITY): Payer: Self-pay

## 2022-12-26 DIAGNOSIS — F419 Anxiety disorder, unspecified: Secondary | ICD-10-CM | POA: Diagnosis present

## 2022-12-26 DIAGNOSIS — J9811 Atelectasis: Secondary | ICD-10-CM | POA: Diagnosis present

## 2022-12-26 DIAGNOSIS — Z8701 Personal history of pneumonia (recurrent): Secondary | ICD-10-CM | POA: Diagnosis not present

## 2022-12-26 DIAGNOSIS — Z7984 Long term (current) use of oral hypoglycemic drugs: Secondary | ICD-10-CM

## 2022-12-26 DIAGNOSIS — G8918 Other acute postprocedural pain: Secondary | ICD-10-CM

## 2022-12-26 DIAGNOSIS — G4730 Sleep apnea, unspecified: Secondary | ICD-10-CM | POA: Diagnosis present

## 2022-12-26 DIAGNOSIS — Z96641 Presence of right artificial hip joint: Secondary | ICD-10-CM | POA: Diagnosis present

## 2022-12-26 DIAGNOSIS — K9189 Other postprocedural complications and disorders of digestive system: Secondary | ICD-10-CM | POA: Diagnosis not present

## 2022-12-26 DIAGNOSIS — Z882 Allergy status to sulfonamides status: Secondary | ICD-10-CM

## 2022-12-26 DIAGNOSIS — R932 Abnormal findings on diagnostic imaging of liver and biliary tract: Secondary | ICD-10-CM

## 2022-12-26 DIAGNOSIS — E1140 Type 2 diabetes mellitus with diabetic neuropathy, unspecified: Secondary | ICD-10-CM | POA: Diagnosis present

## 2022-12-26 DIAGNOSIS — R1011 Right upper quadrant pain: Secondary | ICD-10-CM | POA: Diagnosis present

## 2022-12-26 DIAGNOSIS — Z794 Long term (current) use of insulin: Secondary | ICD-10-CM

## 2022-12-26 DIAGNOSIS — E669 Obesity, unspecified: Secondary | ICD-10-CM | POA: Diagnosis present

## 2022-12-26 DIAGNOSIS — R188 Other ascites: Secondary | ICD-10-CM | POA: Diagnosis present

## 2022-12-26 DIAGNOSIS — D72829 Elevated white blood cell count, unspecified: Secondary | ICD-10-CM | POA: Diagnosis present

## 2022-12-26 DIAGNOSIS — K832 Perforation of bile duct: Secondary | ICD-10-CM | POA: Diagnosis not present

## 2022-12-26 DIAGNOSIS — R1013 Epigastric pain: Secondary | ICD-10-CM | POA: Diagnosis not present

## 2022-12-26 DIAGNOSIS — I10 Essential (primary) hypertension: Secondary | ICD-10-CM | POA: Diagnosis not present

## 2022-12-26 DIAGNOSIS — Z91013 Allergy to seafood: Secondary | ICD-10-CM

## 2022-12-26 DIAGNOSIS — Z9049 Acquired absence of other specified parts of digestive tract: Secondary | ICD-10-CM | POA: Diagnosis not present

## 2022-12-26 DIAGNOSIS — E119 Type 2 diabetes mellitus without complications: Secondary | ICD-10-CM | POA: Diagnosis not present

## 2022-12-26 DIAGNOSIS — E785 Hyperlipidemia, unspecified: Secondary | ICD-10-CM | POA: Diagnosis present

## 2022-12-26 DIAGNOSIS — Z79899 Other long term (current) drug therapy: Secondary | ICD-10-CM

## 2022-12-26 DIAGNOSIS — M1990 Unspecified osteoarthritis, unspecified site: Secondary | ICD-10-CM | POA: Diagnosis present

## 2022-12-26 DIAGNOSIS — Z8249 Family history of ischemic heart disease and other diseases of the circulatory system: Secondary | ICD-10-CM | POA: Diagnosis not present

## 2022-12-26 DIAGNOSIS — K219 Gastro-esophageal reflux disease without esophagitis: Secondary | ICD-10-CM | POA: Diagnosis not present

## 2022-12-26 DIAGNOSIS — F32A Depression, unspecified: Secondary | ICD-10-CM | POA: Diagnosis present

## 2022-12-26 DIAGNOSIS — Y838 Other surgical procedures as the cause of abnormal reaction of the patient, or of later complication, without mention of misadventure at the time of the procedure: Secondary | ICD-10-CM | POA: Diagnosis present

## 2022-12-26 DIAGNOSIS — K838 Other specified diseases of biliary tract: Secondary | ICD-10-CM | POA: Diagnosis not present

## 2022-12-26 DIAGNOSIS — L411 Pityriasis lichenoides chronica: Secondary | ICD-10-CM | POA: Diagnosis present

## 2022-12-26 DIAGNOSIS — Z91030 Bee allergy status: Secondary | ICD-10-CM

## 2022-12-26 DIAGNOSIS — E1165 Type 2 diabetes mellitus with hyperglycemia: Secondary | ICD-10-CM | POA: Diagnosis present

## 2022-12-26 DIAGNOSIS — K760 Fatty (change of) liver, not elsewhere classified: Secondary | ICD-10-CM | POA: Diagnosis present

## 2022-12-26 DIAGNOSIS — Z888 Allergy status to other drugs, medicaments and biological substances status: Secondary | ICD-10-CM

## 2022-12-26 DIAGNOSIS — Z79631 Long term (current) use of antimetabolite agent: Secondary | ICD-10-CM

## 2022-12-26 DIAGNOSIS — E871 Hypo-osmolality and hyponatremia: Secondary | ICD-10-CM | POA: Diagnosis present

## 2022-12-26 DIAGNOSIS — Z6832 Body mass index (BMI) 32.0-32.9, adult: Secondary | ICD-10-CM

## 2022-12-26 DIAGNOSIS — K839 Disease of biliary tract, unspecified: Secondary | ICD-10-CM

## 2022-12-26 DIAGNOSIS — J90 Pleural effusion, not elsewhere classified: Secondary | ICD-10-CM | POA: Diagnosis present

## 2022-12-26 DIAGNOSIS — T8144XA Sepsis following a procedure, initial encounter: Secondary | ICD-10-CM | POA: Diagnosis not present

## 2022-12-26 DIAGNOSIS — R1031 Right lower quadrant pain: Secondary | ICD-10-CM | POA: Diagnosis not present

## 2022-12-26 DIAGNOSIS — K7689 Other specified diseases of liver: Secondary | ICD-10-CM | POA: Diagnosis not present

## 2022-12-26 DIAGNOSIS — Z4803 Encounter for change or removal of drains: Secondary | ICD-10-CM | POA: Diagnosis not present

## 2022-12-26 LAB — URINALYSIS, ROUTINE W REFLEX MICROSCOPIC
Bilirubin Urine: NEGATIVE
Glucose, UA: NEGATIVE mg/dL
Hgb urine dipstick: NEGATIVE
Ketones, ur: NEGATIVE mg/dL
Leukocytes,Ua: NEGATIVE
Nitrite: NEGATIVE
Protein, ur: NEGATIVE mg/dL
Specific Gravity, Urine: 1.02 (ref 1.005–1.030)
pH: 6 (ref 5.0–8.0)

## 2022-12-26 LAB — CBC WITH DIFFERENTIAL/PLATELET
Abs Immature Granulocytes: 0.12 10*3/uL — ABNORMAL HIGH (ref 0.00–0.07)
Basophils Absolute: 0.1 10*3/uL (ref 0.0–0.1)
Basophils Relative: 0 %
Eosinophils Absolute: 0.2 10*3/uL (ref 0.0–0.5)
Eosinophils Relative: 1 %
HCT: 35.5 % — ABNORMAL LOW (ref 36.0–46.0)
Hemoglobin: 11.3 g/dL — ABNORMAL LOW (ref 12.0–15.0)
Immature Granulocytes: 1 %
Lymphocytes Relative: 14 %
Lymphs Abs: 2.4 10*3/uL (ref 0.7–4.0)
MCH: 30 pg (ref 26.0–34.0)
MCHC: 31.8 g/dL (ref 30.0–36.0)
MCV: 94.2 fL (ref 80.0–100.0)
Monocytes Absolute: 0.9 10*3/uL (ref 0.1–1.0)
Monocytes Relative: 5 %
Neutro Abs: 13.8 10*3/uL — ABNORMAL HIGH (ref 1.7–7.7)
Neutrophils Relative %: 79 %
Platelets: 223 10*3/uL (ref 150–400)
RBC: 3.77 MIL/uL — ABNORMAL LOW (ref 3.87–5.11)
RDW: 17.1 % — ABNORMAL HIGH (ref 11.5–15.5)
WBC: 17.3 10*3/uL — ABNORMAL HIGH (ref 4.0–10.5)
nRBC: 0 % (ref 0.0–0.2)

## 2022-12-26 LAB — COMPREHENSIVE METABOLIC PANEL
ALT: 27 U/L (ref 0–44)
AST: 33 U/L (ref 15–41)
Albumin: 3.2 g/dL — ABNORMAL LOW (ref 3.5–5.0)
Alkaline Phosphatase: 63 U/L (ref 38–126)
Anion gap: 10 (ref 5–15)
BUN: 16 mg/dL (ref 8–23)
CO2: 23 mmol/L (ref 22–32)
Calcium: 9.5 mg/dL (ref 8.9–10.3)
Chloride: 99 mmol/L (ref 98–111)
Creatinine, Ser: 0.96 mg/dL (ref 0.44–1.00)
GFR, Estimated: >60 mL/min (ref >60)
Glucose, Bld: 116 mg/dL — ABNORMAL HIGH (ref 70–99)
Potassium: 4.2 mmol/L (ref 3.5–5.1)
Sodium: 132 mmol/L — ABNORMAL LOW (ref 135–145)
Total Bilirubin: 0.3 mg/dL (ref 0.3–1.2)
Total Protein: 6.1 g/dL — ABNORMAL LOW (ref 6.5–8.1)

## 2022-12-26 LAB — LACTIC ACID, PLASMA: Lactic Acid, Venous: 1.3 mmol/L (ref 0.5–1.9)

## 2022-12-26 LAB — LIPASE, BLOOD: Lipase: 20 U/L (ref 11–51)

## 2022-12-26 LAB — GLUCOSE, CAPILLARY
Glucose-Capillary: 149 mg/dL — ABNORMAL HIGH (ref 70–99)
Glucose-Capillary: 193 mg/dL — ABNORMAL HIGH (ref 70–99)

## 2022-12-26 LAB — TROPONIN I (HIGH SENSITIVITY): Troponin I (High Sensitivity): 9 ng/L (ref <18)

## 2022-12-26 MED ORDER — FENTANYL CITRATE PF 50 MCG/ML IJ SOSY
50.0000 ug | PREFILLED_SYRINGE | Freq: Once | INTRAMUSCULAR | Status: AC
  Administered 2022-12-26: 50 ug via INTRAVENOUS
  Filled 2022-12-26: qty 1

## 2022-12-26 MED ORDER — ONDANSETRON HCL 4 MG/2ML IJ SOLN
4.0000 mg | Freq: Once | INTRAMUSCULAR | Status: AC
  Administered 2022-12-26: 4 mg via INTRAVENOUS
  Filled 2022-12-26: qty 2

## 2022-12-26 MED ORDER — HYDROMORPHONE HCL 1 MG/ML IJ SOLN
1.0000 mg | INTRAMUSCULAR | Status: AC | PRN
  Administered 2022-12-26 (×2): 1 mg via INTRAVENOUS
  Filled 2022-12-26 (×2): qty 1

## 2022-12-26 MED ORDER — INSULIN ASPART 100 UNIT/ML IJ SOLN
0.0000 [IU] | INTRAMUSCULAR | Status: DC
  Administered 2022-12-26: 1 [IU] via SUBCUTANEOUS
  Administered 2022-12-26 – 2022-12-27 (×2): 2 [IU] via SUBCUTANEOUS
  Administered 2022-12-27 (×2): 1 [IU] via SUBCUTANEOUS
  Administered 2022-12-27: 2 [IU] via SUBCUTANEOUS
  Administered 2022-12-27 (×2): 1 [IU] via SUBCUTANEOUS
  Administered 2022-12-28: 3 [IU] via SUBCUTANEOUS
  Administered 2022-12-28 – 2022-12-29 (×2): 1 [IU] via SUBCUTANEOUS
  Administered 2022-12-29 (×2): 2 [IU] via SUBCUTANEOUS
  Administered 2022-12-29: 1 [IU] via SUBCUTANEOUS

## 2022-12-26 MED ORDER — SERTRALINE HCL 25 MG PO TABS
150.0000 mg | ORAL_TABLET | Freq: Every day | ORAL | Status: DC
  Administered 2022-12-28 – 2022-12-30 (×3): 150 mg via ORAL
  Filled 2022-12-26 (×3): qty 2

## 2022-12-26 MED ORDER — ONDANSETRON HCL 4 MG/2ML IJ SOLN: 4.0000 mg | Freq: Four times a day (QID) | INTRAMUSCULAR | Status: DC | PRN

## 2022-12-26 MED ORDER — HYDROMORPHONE HCL 1 MG/ML IJ SOLN
0.5000 mg | INTRAMUSCULAR | Status: DC | PRN
  Administered 2022-12-26 – 2022-12-27 (×3): 0.5 mg via INTRAVENOUS
  Filled 2022-12-26 (×3): qty 0.5

## 2022-12-26 MED ORDER — ENOXAPARIN SODIUM 40 MG/0.4ML IJ SOSY
40.0000 mg | PREFILLED_SYRINGE | INTRAMUSCULAR | Status: DC
  Administered 2022-12-26: 40 mg via SUBCUTANEOUS
  Filled 2022-12-26: qty 0.4

## 2022-12-26 MED ORDER — LACTATED RINGERS IV BOLUS
1000.0000 mL | Freq: Once | INTRAVENOUS | Status: AC
  Administered 2022-12-26: 1000 mL via INTRAVENOUS

## 2022-12-26 MED ORDER — OXYCODONE HCL 5 MG PO TABS
5.0000 mg | ORAL_TABLET | ORAL | Status: DC | PRN
  Administered 2022-12-26 – 2022-12-30 (×11): 5 mg via ORAL
  Filled 2022-12-26 (×11): qty 1

## 2022-12-26 MED ORDER — INSULIN GLARGINE-YFGN 100 UNIT/ML ~~LOC~~ SOLN
5.0000 [IU] | Freq: Every day | SUBCUTANEOUS | Status: DC
  Administered 2022-12-26 – 2022-12-29 (×4): 5 [IU] via SUBCUTANEOUS
  Filled 2022-12-26 (×5): qty 0.05

## 2022-12-26 MED ORDER — ACETAMINOPHEN 325 MG PO TABS
650.0000 mg | ORAL_TABLET | Freq: Four times a day (QID) | ORAL | Status: DC | PRN
  Administered 2022-12-26 – 2022-12-27 (×2): 650 mg via ORAL
  Filled 2022-12-26 (×2): qty 2

## 2022-12-26 MED ORDER — DIPHENHYDRAMINE HCL 12.5 MG/5ML PO ELIX: 12.5000 mg | ORAL_SOLUTION | Freq: Four times a day (QID) | ORAL | Status: DC | PRN

## 2022-12-26 MED ORDER — IOHEXOL 350 MG/ML SOLN
75.0000 mL | Freq: Once | INTRAVENOUS | Status: AC | PRN
  Administered 2022-12-26: 75 mL via INTRAVENOUS

## 2022-12-26 MED ORDER — LACTATED RINGERS IV SOLN: INTRAVENOUS | Status: DC

## 2022-12-26 MED ORDER — GADOXETATE DISODIUM 0.25 MMOL/ML IV SOLN
9.0000 mL | Freq: Once | INTRAVENOUS | Status: AC | PRN
  Administered 2022-12-26: 9 mL via INTRAVENOUS

## 2022-12-26 MED ORDER — DIPHENHYDRAMINE HCL 50 MG/ML IJ SOLN: 12.5000 mg | Freq: Four times a day (QID) | INTRAMUSCULAR | Status: DC | PRN

## 2022-12-26 MED ORDER — ROSUVASTATIN CALCIUM 5 MG PO TABS
5.0000 mg | ORAL_TABLET | Freq: Every evening | ORAL | Status: DC
  Administered 2022-12-26: 5 mg via ORAL
  Filled 2022-12-26: qty 1

## 2022-12-26 MED ORDER — ONDANSETRON 4 MG PO TBDP: 4.0000 mg | ORAL_TABLET | Freq: Four times a day (QID) | ORAL | Status: DC | PRN

## 2022-12-26 MED ORDER — PANTOPRAZOLE SODIUM 40 MG PO TBEC
40.0000 mg | DELAYED_RELEASE_TABLET | Freq: Every day | ORAL | Status: DC
  Administered 2022-12-26: 40 mg via ORAL
  Filled 2022-12-26: qty 1

## 2022-12-26 NOTE — Progress Notes (Signed)
Pt states she cannot tolerate the hospital Bipap, pt doesn't want to wear a Bipap for the night.

## 2022-12-26 NOTE — ED Provider Notes (Addendum)
[place] EMERGENCY DEPARTMENT AT [facility] Provider Note   CSN: [id number] Arrival date & time: 12/26/22  2841     History  Chief Complaint  Patient presents with   Abdominal Pain    [name] [name] is a 77 y.o. female.   Abdominal Pain Associated symptoms: nausea      77 year old female with medical history significant for recent cholecystectomy 3 days ago at [facility] who presents to the emergency department with severe abdominal pain.  The patient states that she had fairly minimal discomfort postoperatively for the past couple of days.  Last night she developed right upper quadrant and right-sided abdominal pain with associated nausea.  No vomiting.  Her last bowel movement was last night was normal.  She thinks she is passing gas.  No fevers or chills.  She developed severe abdominal pain on the right side over night that radiates to her right shoulder.  It was not responsive to her home opiates and so she is presented to the emergency department for further evaluation.  Home Medications Prior to Admission medications   Medication Sig Start Date End Date Taking? Authorizing Provider  cyclobenzaprine (FLEXERIL) 10 MG tablet TAKE 1 TABLET BY MOUTH AT BEDTIME 09/07/22   Burns, Bobette Mo, MD  insulin glargine (LANTUS SOLOSTAR) 100 UNIT/ML Solostar Pen Inject 20 Units into the skin 2 (two) times daily. 10/14/22   Pincus Sanes, MD  lisinopril (ZESTRIL) 20 MG tablet Take 1 tablet (20 mg total) by mouth daily. 10/20/21   Lennette Bihari, MD  metFORMIN (GLUCOPHAGE-XR) 500 MG 24 hr tablet TAKE 1 TABLET BY MOUTH TWICE A DAY 07/24/22   Pincus Sanes, MD  methotrexate (RHEUMATREX) 2.5 MG tablet Take 20 mg by mouth every Sunday at 6pm. 06/24/16   [provider]  omeprazole (PRILOSEC) 40 MG capsule Take 1 capsule (40 mg total) by mouth 2 (two) times daily. 11/11/22   Imogene Burn, MD  oxyCODONE-acetaminophen (PERCOCET) 10-325 MG tablet Take 1 tablet by mouth every 8  (eight) hours as needed for pain. 09/14/22   [provider]  potassium chloride (KLOR-CON M) 10 MEQ tablet Take 2 tablets (20 mEq total) by mouth daily. Patient taking differently: Take 10 mEq by mouth 2 (two) times daily. 09/18/22   Pincus Sanes, MD  rosuvastatin (CRESTOR) 5 MG tablet TAKE 1 TABLET(5 MG) BY MOUTH DAILY Patient taking differently: Take 5 mg by mouth every evening. 03/19/22   Pincus Sanes, MD  sertraline (ZOLOFT) 100 MG tablet Take 1.5 tablets (150 mg total) by mouth daily. 09/21/22   Pincus Sanes, MD      Allergies    Other, Thorazine [chlorpromazine], Bee venom, Farxiga [dapagliflozin], Semaglutide, and Sulfa antibiotics    Review of Systems   Review of Systems  Gastrointestinal:  Positive for abdominal pain and nausea.  All other systems reviewed and are negative.   Physical Exam Updated Vital Signs BP (!) 180/73   Pulse 91   Temp 98 F (36.7 C) (Oral)   Resp 18   Ht 5\' 6"  (1.676 m)   Wt 90.3 kg   SpO2 97%   BMI 32.13 kg/m  Physical Exam Vitals and nursing note reviewed.  Constitutional:      General: She is not in acute distress.    Appearance: She is well-developed.  HENT:     Head: Normocephalic and atraumatic.  Eyes:     Conjunctiva/sclera: Conjunctivae normal.  Cardiovascular:     Rate  and Rhythm: Normal rate and regular rhythm.     Heart sounds: No murmur heard. Pulmonary:     Effort: Pulmonary effort is normal. No respiratory distress.     Breath sounds: Normal breath sounds.  Abdominal:     Palpations: Abdomen is soft.     Tenderness: There is abdominal tenderness in the right upper quadrant and right lower quadrant. There is no guarding or rebound.  Musculoskeletal:        General: No swelling.     Cervical back: Neck supple.  Skin:    General: Skin is warm and dry.     Capillary Refill: Capillary refill takes less than 2 seconds.  Neurological:     Mental Status: She is alert.  Psychiatric:        Mood and Affect: Mood  normal.     ED Results / Procedures / Treatments   Labs (all labs ordered are listed, but only abnormal results are displayed) Labs Reviewed  COMPREHENSIVE METABOLIC PANEL - Abnormal; Notable for the following components:      Result Value   Sodium 132 (*)    Glucose, Bld 116 (*)    Total Protein 6.1 (*)    Albumin 3.2 (*)    All other components within normal limits  CBC WITH DIFFERENTIAL/PLATELET - Abnormal; Notable for the following components:   WBC 17.3 (*)    RBC 3.77 (*)    Hemoglobin 11.3 (*)    HCT 35.5 (*)    RDW 17.1 (*)    Neutro Abs 13.8 (*)    Abs Immature Granulocytes 0.12 (*)    All other components within normal limits  LIPASE, BLOOD  URINALYSIS, ROUTINE W REFLEX MICROSCOPIC  LACTIC ACID, PLASMA  TROPONIN I (HIGH SENSITIVITY)    EKG EKG Interpretation  Date/Time:  Saturday Dec 26 2022 09:33:47 EDT Ventricular Rate:  78 PR Interval:  205 QRS Duration: 142 QT Interval:  401 QTC Calculation: 457 R Axis:   29 Text Interpretation: Sinus rhythm Right bundle branch block No significant change since last tracing Confirmed by Ernie Avena ([id number]) on 12/26/2022 12:05:07 PM  Radiology CT ABDOMEN PELVIS W CONTRAST  Result Date: 12/26/2022 CLINICAL DATA:  77 year old female with abdominal pain. Postoperative day 3 status post cholecystectomy. EXAM: CT ABDOMEN AND PELVIS WITH CONTRAST TECHNIQUE: Multidetector CT imaging of the abdomen and pelvis was performed using the standard protocol following bolus administration of intravenous contrast. RADIATION DOSE REDUCTION: This exam was performed according to the departmental dose-optimization program which includes automated exposure control, adjustment of the mA and/or kV according to patient size and/or use of iterative reconstruction technique. CONTRAST:  75mL OMNIPAQUE IOHEXOL 350 MG/ML SOLN COMPARISON:  CT Abdomen and Pelvis 10/21/2022 and earlier. FINDINGS: Lower chest: Small layering pleural effusions are new. No  pericardial effusion. Mild dependent lung base opacity most resembles atelectasis. Hepatobiliary: Nodular liver contour with small volume new perihepatic ascites (series 3, image 23), simple fluid density. However, gallbladder also surgically absent now. Surgical clips and expected postoperative changes in the gallbladder fossa. No discrete liver lesion. Pancreas: Atrophied. Spleen: Stable spleen at the upper limits of normal. Adrenals/Urinary Tract: Normal adrenal glands. Nonobstructed kidneys with symmetric renal enhancement and contrast excretion. Distended but otherwise unremarkable bladder. Stomach/Bowel: No dilated large or small bowel loops. Retained stool in the colon. Diverticulosis of the descending and sigmoid colon. Redundant right colon. Cecum on a lax mesentery in the anterior abdomen. Normal appendix arising on series 3, image 66. No abnormal small bowel. Small  gastric hiatal hernia. Small volume fluid in the stomach. Decompressed duodenum. No free air. Vascular/Lymphatic: Aortoiliac calcified atherosclerosis. Major arterial structures are patent. Normal caliber abdominal aorta. Portal venous system is patent. No lymphadenopathy identified. Reproductive: Negative. Other: Trace new pelvis free fluid. Mild ventral abdominal wall postoperative changes with no adverse features. Musculoskeletal: Right hip arthroplasty. Advanced degeneration in the spine. Chronic T12 compression fracture. Stable visualized osseous structures. IMPRESSION: 1. Recent cholecystectomy with largely expected postoperative changes. A small volume of Ascites is new, nonspecific, but with underlying nodular liver contour raising the possibility of Cirrhosis. 2. Small layering pleural effusions are new with lung base atelectasis. 3. No other acute or inflammatory process identified in the abdomen or pelvis. Aortic Atherosclerosis (ICD10-I70.0). Electronically Signed   By: Odessa Fleming M.D.   On: 12/26/2022 12:34    Procedures Procedures     Medications Ordered in ED Medications  HYDROmorphone (DILAUDID) injection 1 mg (1 mg Intravenous Given 12/26/22 1214)  fentaNYL (SUBLIMAZE) injection 50 mcg (50 mcg Intravenous Given 12/26/22 0856)  ondansetron (ZOFRAN) injection 4 mg (4 mg Intravenous Given 12/26/22 0856)  lactated ringers bolus 1,000 mL (0 mLs Intravenous Stopped 12/26/22 1141)  fentaNYL (SUBLIMAZE) injection 50 mcg (50 mcg Intravenous Given 12/26/22 0916)  iohexol (OMNIPAQUE) 350 MG/ML injection 75 mL (75 mLs Intravenous Contrast Given 12/26/22 1204)    ED Course/ Medical Decision Making/ A&P Clinical Course as of 12/26/22 1348  Sat Dec 26, 2022  1023 WBC(!): 17.3 [JL]    Clinical Course User Index [JL] Ernie Avena, MD                             Medical Decision Making Amount and/or Complexity of Data Reviewed Labs: ordered. Decision-making details documented in ED Course. Radiology: ordered.  Risk Prescription drug management. Decision regarding hospitalization.   77 year old female with medical history significant for recent cholecystectomy 3 days ago at [facility] who presents to the emergency department with severe abdominal pain.  The patient states that she had fairly minimal discomfort postoperatively for the past couple of days.  Last night she developed right upper quadrant and right-sided abdominal pain with associated nausea.  No vomiting.  Her last bowel movement was last night was normal.  She thinks she is passing gas.  No fevers or chills.  She developed severe abdominal pain on the right side over night that radiates to her right shoulder.  It was not responsive to her home opiates and so she is presented to the emergency department for further evaluation.  On arrival, the patient was afebrile, not tachycardic or tachypneic, BP 176/74, saturating 98% on room air.  Sinus rhythm noted on cardiac telemetry.  Physical exam significant for right-sided abdominal tenderness to palpation, no rebound or  guarding.  Differential diagnosis includes postoperative ileus, bowel obstruction, postoperative infection, postoperative pain.  Labs: Evaluation significant for CBC with leukocytosis to 17.3, anemia to 11.3.  Lactic acid was normal.  The patient was administered 100 mcg of fentanyl for pain control and IV Zofran for nausea.  She was administered an IV fluid bolus.  Remainder of laboratory evaluation significant for CMP hyponatremia 132, otherwise generally unremarkable, lipase normal, urinalysis, negative.  I spoke with Dr. Sophronia Simas of general surgery who will evaluate the patient, she did recommend CT imaging given the patient's initial leukocytosis. Imaging: IMPRESSION:  1. Recent cholecystectomy with largely expected postoperative  changes. A small volume of Ascites is new, nonspecific, but  with  underlying nodular liver contour raising the possibility of  Cirrhosis.  2. Small layering pleural effusions are new with lung base  atelectasis.  3. No other acute or inflammatory process identified in the abdomen  or pelvis. Aortic Atherosclerosis (ICD10-I70.0).    Surgery consult: Discussed care with the patient with Dr. Sophronia Simas who will admit the patient for further monitoring.  The patient was updated regarding the plan of care.   Final Clinical Impression(s) / ED Diagnoses Final diagnoses:  Acute post-operative pain    Rx / DC Orders ED Discharge Orders     None         Ernie Avena, MD 12/26/22 1348    Ernie Avena, MD 12/26/22 1350

## 2022-12-26 NOTE — H&P (Signed)
[name] [name] 06/14/1946  [id number].    HPI:  [name] [name] is a 77 yo female who underwent a laparoscopic cholecystectomy 3 days ago by Dr. Andrey Campanile for symptomatic cholelithiasis.  Her surgery was uneventful and she was discharged home postoperatively.  She said she initially felt very well after surgery with minimal pain until last night.  Last night she started having severe right-sided and right upper quadrant abdominal pain with radiation to her right shoulder.  She has also had a lot of nausea and minimal appetite.  She denies fevers.  She presented to the ED.  Labs are notable for a WBC of 17 (increased from 12 4 days ago).  Her LFTs are unremarkable.  The CT scan showed a small amount of fluid in the gallbladder fossa as well as perihepatic free fluid.  General surgery was consulted.  On my exam the patient continues to have abdominal pain.  ROS: Review of Systems  Constitutional:  Negative for chills and fever.  Respiratory:  Negative for shortness of breath.   Cardiovascular:  Negative for chest pain.  Gastrointestinal:  Positive for abdominal pain and nausea. Negative for vomiting.    Family History  Problem Relation Age of Onset   Hypertension Mother     Past Medical History:  Diagnosis Date   Anemia    Anxiety    Arthritis    Closed fracture of fifth metatarsal bone 05/13/2018   Deep vein thrombosis (HCC)    right calf - 05/2012    Depression    Diabetes mellitus without complication (HCC)    diet controlled    GERD (gastroesophageal reflux disease)    Hyperlipidemia    Patient denies.   Hypertension    Ejection fraction =>55% Left ventricular systolic function is normal. Left ventricular wall motion is normal     Lymphocytic colitis    Neuropathy    diabetic - in bilateral feet    Pityriasis lichenoides chronica    Pneumonia    Sleep apnea    bipap    Past Surgical History:  Procedure Laterality Date   BIOPSY  11/09/2022   Procedure: BIOPSY;  Surgeon:  Lemar Lofty., MD;  Location: [facility];  Service: Gastroenterology;;   CHOLECYSTECTOMY N/A 12/23/2022   Procedure: LAPAROSCOPIC CHOLECYSTECTOMY WITH ICG DYE;  Surgeon: Gaynelle Adu, MD;  Location: [facility];  Service: General;  Laterality: N/A;   DILATION AND CURETTAGE OF UTERUS     ESOPHAGOGASTRODUODENOSCOPY N/A 11/09/2022   Procedure: ESOPHAGOGASTRODUODENOSCOPY (EGD);  Surgeon: Lemar Lofty., MD;  Location: [facility];  Service: Gastroenterology;  Laterality: N/A;   EYE SURGERY  07/2020   HAMMER TOE SURGERY     right hand surgery      due to blood infection    torn meniscus repair      right knee    TOTAL HIP ARTHROPLASTY  09/13/2012   Procedure: TOTAL HIP ARTHROPLASTY ANTERIOR APPROACH;  Surgeon: Shelda Pal, MD;  Location: [facility];  Service: Orthopedics;  Laterality: Right;   UPPER ESOPHAGEAL ENDOSCOPIC ULTRASOUND (EUS) N/A 11/09/2022   Procedure: UPPER ESOPHAGEAL ENDOSCOPIC ULTRASOUND (EUS);  Surgeon: Lemar Lofty., MD;  Location: [facility];  Service: Gastroenterology;  Laterality: N/A;    Social History:  reports that she has never smoked. She has never used smokeless tobacco. She reports current alcohol use. She reports that she does not use drugs.  Allergies:  Allergies  Allergen Reactions   Other Shortness Of Breath    Blue fish: palms  and feet turn red   Thorazine [Chlorpromazine] Other (See Comments)    Made her more more off balanced, vision   Bee Venom Swelling   Farxiga [Dapagliflozin] Swelling    Gi upset   Semaglutide     Rybelsus - lip swelling, GI upset   Sulfa Antibiotics Hives    (Not in a hospital admission)    Physical Exam: Blood pressure (!) 149/82, pulse 95, temperature 98 F (36.7 C), temperature source Oral, resp. rate (!) 22, height 5\' 6"  (1.676 m), weight 90.3 kg, SpO2 100 %. General: resting comfortably, appears stated age, no apparent distress Neurological: alert and oriented, no focal deficits HEENT:  normocephalic, atraumatic, no scleral icterus CV: regular rate and rhythm Respiratory: normal work of breathing room air Abdomen: soft, nondistended, tender to palpation particularly in the right upper quadrant.  Incisions are clean and dry with mild ecchymosis but no erythema or induration. Extremities: warm and well-perfused, no deformities, moving all extremities spontaneously Psychiatric: normal mood and affect Skin: warm and dry, no jaundice   Results for orders placed or performed during the hospital encounter of 12/26/22 (from the past 48 hour(s))  Comprehensive metabolic panel     Status: Abnormal   Collection Time: 12/26/22  8:37 AM  Result Value Ref Range   Sodium 132 (L) 135 - 145 mmol/L   Potassium 4.2 3.5 - 5.1 mmol/L   Chloride 99 98 - 111 mmol/L   CO2 23 22 - 32 mmol/L   Glucose, Bld 116 (H) 70 - 99 mg/dL    Comment: Glucose reference range applies only to samples taken after fasting for at least 8 hours.   BUN 16 8 - 23 mg/dL   Creatinine, Ser 1.61 0.44 - 1.00 mg/dL   Calcium 9.5 8.9 - 09.6 mg/dL   Total Protein 6.1 (L) 6.5 - 8.1 g/dL   Albumin 3.2 (L) 3.5 - 5.0 g/dL   AST 33 15 - 41 U/L   ALT 27 0 - 44 U/L   Alkaline Phosphatase 63 38 - 126 U/L   Total Bilirubin 0.3 0.3 - 1.2 mg/dL   GFR, Estimated >04 >54 mL/min    Comment: (NOTE) Calculated using the CKD-EPI Creatinine Equation (2021)    Anion gap 10 5 - 15    Comment: Performed at [facility], [address], [place], [place] [address]  Lipase, blood     Status: None   Collection Time: 12/26/22  8:37 AM  Result Value Ref Range   Lipase 20 11 - 51 U/L    Comment: Performed at [facility], [address] [place], [place] [address]  CBC with Diff     Status: Abnormal   Collection Time: 12/26/22  8:37 AM  Result Value Ref Range   WBC 17.3 (H) 4.0 - 10.5 K/uL   RBC 3.77 (L) 3.87 - 5.11 MIL/uL   Hemoglobin 11.3 (L) 12.0 - 15.0 g/dL   HCT 29.5 (L) 62.1 - 30.8 %   MCV 94.2 80.0 - 100.0 fL    MCH 30.0 26.0 - 34.0 pg   MCHC 31.8 30.0 - 36.0 g/dL   RDW 65.7 (H) 84.6 - 96.2 %   Platelets 223 150 - 400 K/uL   nRBC 0.0 0.0 - 0.2 %   Neutrophils Relative % 79 %   Neutro Abs 13.8 (H) 1.7 - 7.7 K/uL   Lymphocytes Relative 14 %   Lymphs Abs 2.4 0.7 - 4.0 K/uL   Monocytes Relative 5 %  Monocytes Absolute 0.9 0.1 - 1.0 K/uL   Eosinophils Relative 1 %   Eosinophils Absolute 0.2 0.0 - 0.5 K/uL   Basophils Relative 0 %   Basophils Absolute 0.1 0.0 - 0.1 K/uL   Immature Granulocytes 1 %   Abs Immature Granulocytes 0.12 (H) 0.00 - 0.07 K/uL    Comment: Performed at [facility], [address], [place], [place] [address]  Urinalysis, Routine w reflex microscopic -Urine, Clean Catch     Status: None   Collection Time: 12/26/22  8:37 AM  Result Value Ref Range   Color, Urine YELLOW YELLOW   APPearance CLEAR CLEAR   Specific Gravity, Urine 1.020 1.005 - 1.030   pH 6.0 5.0 - 8.0   Glucose, UA NEGATIVE NEGATIVE mg/dL   Hgb urine dipstick NEGATIVE NEGATIVE   Bilirubin Urine NEGATIVE NEGATIVE   Ketones, ur NEGATIVE NEGATIVE mg/dL   Protein, ur NEGATIVE NEGATIVE mg/dL   Nitrite NEGATIVE NEGATIVE   Leukocytes,Ua NEGATIVE NEGATIVE    Comment: Microscopic not done on urines with negative protein, blood, leukocytes, nitrite, or glucose < 500 mg/dL. Performed at [facility], [address], [place], [place] [address]   Lactic acid, plasma     Status: None   Collection Time: 12/26/22  9:03 AM  Result Value Ref Range   Lactic Acid, Venous 1.3 0.5 - 1.9 mmol/L    Comment: Performed at [facility], [address], [place], [place] [address]  Troponin I (High Sensitivity)     Status: None   Collection Time: 12/26/22  9:03 AM  Result Value Ref Range   Troponin I (High Sensitivity) 9 <18 ng/L    Comment: (NOTE) Elevated high sensitivity troponin I (hsTnI) values and significant  changes across serial measurements may suggest ACS but many other  chronic and acute conditions  are known to elevate hsTnI results.  Refer to the "Links" section for chest pain algorithms and additional  guidance. Performed at [facility], [address], [place], [place] [address]    CT ABDOMEN PELVIS W CONTRAST  Result Date: 12/26/2022 CLINICAL DATA:  77 year old female with abdominal pain. Postoperative day 3 status post cholecystectomy. EXAM: CT ABDOMEN AND PELVIS WITH CONTRAST TECHNIQUE: Multidetector CT imaging of the abdomen and pelvis was performed using the standard protocol following bolus administration of intravenous contrast. RADIATION DOSE REDUCTION: This exam was performed according to the departmental dose-optimization program which includes automated exposure control, adjustment of the mA and/or kV according to patient size and/or use of iterative reconstruction technique. CONTRAST:  75mL OMNIPAQUE IOHEXOL 350 MG/ML SOLN COMPARISON:  CT Abdomen and Pelvis 10/21/2022 and earlier. FINDINGS: Lower chest: Small layering pleural effusions are new. No pericardial effusion. Mild dependent lung base opacity most resembles atelectasis. Hepatobiliary: Nodular liver contour with small volume new perihepatic ascites (series 3, image 23), simple fluid density. However, gallbladder also surgically absent now. Surgical clips and expected postoperative changes in the gallbladder fossa. No discrete liver lesion. Pancreas: Atrophied. Spleen: Stable spleen at the upper limits of normal. Adrenals/Urinary Tract: Normal adrenal glands. Nonobstructed kidneys with symmetric renal enhancement and contrast excretion. Distended but otherwise unremarkable bladder. Stomach/Bowel: No dilated large or small bowel loops. Retained stool in the colon. Diverticulosis of the descending and sigmoid colon. Redundant right colon. Cecum on a lax mesentery in the anterior abdomen. Normal appendix arising on series 3, image 66. No abnormal small bowel. Small gastric hiatal hernia. Small volume fluid in the stomach.  Decompressed duodenum.  No free air. Vascular/Lymphatic: Aortoiliac calcified atherosclerosis. Major arterial structures are patent. Normal caliber abdominal aorta. Portal venous system is patent. No lymphadenopathy identified. Reproductive: Negative. Other: Trace new pelvis free fluid. Mild ventral abdominal wall postoperative changes with no adverse features. Musculoskeletal: Right hip arthroplasty. Advanced degeneration in the spine. Chronic T12 compression fracture. Stable visualized osseous structures. IMPRESSION: 1. Recent cholecystectomy with largely expected postoperative changes. A small volume of Ascites is new, nonspecific, but with underlying nodular liver contour raising the possibility of Cirrhosis. 2. Small layering pleural effusions are new with lung base atelectasis. 3. No other acute or inflammatory process identified in the abdomen or pelvis. Aortic Atherosclerosis (ICD10-I70.0). Electronically Signed   By: Odessa Fleming M.D.   On: 12/26/2022 12:34      Assessment/Plan This is a 77 year old female presenting 3 days after a cholecystectomy with severe right upper quadrant abdominal pain and perihepatic fluid.  I have personally reviewed her labs, imaging, and notes.  Her pain began last night and is more severe than what I would expect of typical postoperative pain, and there is more fluid than expected around the liver for typical postop findings. Given her new leukocytosis and the severity of her pain, I am concerned she could have a cystic duct stump leak.  Ideally we would perform a HIDA, however after discussing with radiology there is no staff to perform this over the weekend.  I discussed with radiology (Dr. Reche Dixon), who recommended an MRI with [facility] instead to evaluate for bile leak. - Clear liquid diet - Pain and nausea control - MRI pending - Hold on antibiotics for now - Repeat labs in am - VTE: lovenox, SCDs - Admit to observation   Sophronia Simas, MD [facility]  Surgery General, Hepatobiliary and Pancreatic Surgery 12/26/22 5:21 PM

## 2022-12-26 NOTE — ED Notes (Signed)
Pt's transport monitor placed on green zone screen and secretary notified that pt is in purple zone

## 2022-12-26 NOTE — ED Notes (Signed)
ED TO INPATIENT HANDOFF REPORT  ED Nurse Name and Phone #: [phone]  S Name/Age/Gender [name] [name] 77 y.o. female Room/Bed: 048C/048C  Code Status   Code Status: Full Code  Home/SNF/Other Home Patient oriented to: self, place, time, and situation Is this baseline? Yes   Triage Complete: Triage complete  Chief Complaint Abdominal fluid collection [R18.8]  Triage Note Pt c/o RLQ abdominal pain into right shoulder pain started last night. PT denies V/D. Pt had cholecystectomy 3 days ago.    Allergies Allergies  Allergen Reactions   Other Shortness Of Breath    Blue fish: palms and feet turn red   Thorazine [Chlorpromazine] Other (See Comments)    Made her more more off balanced, vision   Bee Venom Swelling   Farxiga [Dapagliflozin] Swelling    Gi upset   Semaglutide     Rybelsus - lip swelling, GI upset   Sulfa Antibiotics Hives    Level of Care/Admitting Diagnosis ED Disposition     ED Disposition  Admit   Condition  --   Comment  Hospital Area: [facility] [[id number]]  Level of Care: Med-Surg [16]  May place patient in observation at [facility] or [facility] if equivalent level of care is available:: No  Covid Evaluation: Asymptomatic - no recent exposure (last 10 days) testing not required  Diagnosis: Abdominal fluid collection [621308]  Admitting Physician: Fritzi Mandes [[id number]]  Attending Physician: CCS, MD [[id number]]          B Medical/Surgery History Past Medical History:  Diagnosis Date   Anemia    Anxiety    Arthritis    Closed fracture of fifth metatarsal bone 05/13/2018   Deep vein thrombosis (HCC)    right calf - 05/2012    Depression    Diabetes mellitus without complication (HCC)    diet controlled    GERD (gastroesophageal reflux disease)    Hyperlipidemia    Patient denies.   Hypertension    Ejection fraction =>55% Left ventricular systolic function is normal. Left ventricular wall motion is normal      Lymphocytic colitis    Neuropathy    diabetic - in bilateral feet    Pityriasis lichenoides chronica    Pneumonia    Sleep apnea    bipap   Past Surgical History:  Procedure Laterality Date   BIOPSY  11/09/2022   Procedure: BIOPSY;  Surgeon: Lemar Lofty., MD;  Location: [facility];  Service: Gastroenterology;;   CHOLECYSTECTOMY N/A 12/23/2022   Procedure: LAPAROSCOPIC CHOLECYSTECTOMY WITH ICG DYE;  Surgeon: Gaynelle Adu, MD;  Location: [facility];  Service: General;  Laterality: N/A;   DILATION AND CURETTAGE OF UTERUS     ESOPHAGOGASTRODUODENOSCOPY N/A 11/09/2022   Procedure: ESOPHAGOGASTRODUODENOSCOPY (EGD);  Surgeon: Lemar Lofty., MD;  Location: [facility];  Service: Gastroenterology;  Laterality: N/A;   EYE SURGERY  07/2020   HAMMER TOE SURGERY     right hand surgery      due to blood infection    torn meniscus repair      right knee    TOTAL HIP ARTHROPLASTY  09/13/2012   Procedure: TOTAL HIP ARTHROPLASTY ANTERIOR APPROACH;  Surgeon: Shelda Pal, MD;  Location: [facility];  Service: Orthopedics;  Laterality: Right;   UPPER ESOPHAGEAL ENDOSCOPIC ULTRASOUND (EUS) N/A 11/09/2022   Procedure: UPPER ESOPHAGEAL ENDOSCOPIC ULTRASOUND (EUS);  Surgeon: Lemar Lofty., MD;  Location: [facility];  Service: Gastroenterology;  Laterality: N/A;     A IV  Location/Drains/Wounds Patient Lines/Drains/Airways Status     Active Line/Drains/Airways     Name Placement date Placement time Site Days   Peripheral IV 12/26/22 20 G 1" Posterior;Proximal;Right Forearm 12/26/22  0853  Forearm  less than 1   Incision - 4 Ports Abdomen 1: Umbilicus 2: Upper 3: Right;Medial 4: Lateral;Right 12/23/22  0931  -- 3   Wound / Incision (Open or Dehisced) 07/09/19 Diabetic ulcer Foot Left 07/09/19  1620  Foot  1266   Wound / Incision (Open or Dehisced) 07/09/19 Other (Comment) Toe (Comment  which one) Left full thickness wound in between 4th-5th toes 07/09/19  1620  Toe (Comment  which  one)  1266            Intake/Output Last 24 hours No intake or output data in the 24 hours ending 12/26/22 1542  Labs/Imaging Results for orders placed or performed during the hospital encounter of 12/26/22 (from the past 48 hour(s))  Comprehensive metabolic panel     Status: Abnormal   Collection Time: 12/26/22  8:37 AM  Result Value Ref Range   Sodium 132 (L) 135 - 145 mmol/L   Potassium 4.2 3.5 - 5.1 mmol/L   Chloride 99 98 - 111 mmol/L   CO2 23 22 - 32 mmol/L   Glucose, Bld 116 (H) 70 - 99 mg/dL    Comment: Glucose reference range applies only to samples taken after fasting for at least 8 hours.   BUN 16 8 - 23 mg/dL   Creatinine, Ser 5.78 0.44 - 1.00 mg/dL   Calcium 9.5 8.9 - 46.9 mg/dL   Total Protein 6.1 (L) 6.5 - 8.1 g/dL   Albumin 3.2 (L) 3.5 - 5.0 g/dL   AST 33 15 - 41 U/L   ALT 27 0 - 44 U/L   Alkaline Phosphatase 63 38 - 126 U/L   Total Bilirubin 0.3 0.3 - 1.2 mg/dL   GFR, Estimated >62 >95 mL/min    Comment: (NOTE) Calculated using the CKD-EPI Creatinine Equation (2021)    Anion gap 10 5 - 15    Comment: Performed at [facility], [address] [place], [place] [address]  Lipase, blood     Status: None   Collection Time: 12/26/22  8:37 AM  Result Value Ref Range   Lipase 20 11 - 51 U/L    Comment: Performed at [facility], [address] [place], [place] [address]  CBC with Diff     Status: Abnormal   Collection Time: 12/26/22  8:37 AM  Result Value Ref Range   WBC 17.3 (H) 4.0 - 10.5 K/uL   RBC 3.77 (L) 3.87 - 5.11 MIL/uL   Hemoglobin 11.3 (L) 12.0 - 15.0 g/dL   HCT 02.7 (L) 25.3 - 66.4 %   MCV 94.2 80.0 - 100.0 fL   MCH 30.0 26.0 - 34.0 pg   MCHC 31.8 30.0 - 36.0 g/dL   RDW 40.3 (H) 47.4 - 25.9 %   Platelets 223 150 - 400 K/uL   nRBC 0.0 0.0 - 0.2 %   Neutrophils Relative % 79 %   Neutro Abs 13.8 (H) 1.7 - 7.7 K/uL   Lymphocytes Relative 14 %   Lymphs Abs 2.4 0.7 - 4.0 K/uL   Monocytes Relative 5 %   Monocytes Absolute 0.9 0.1  - 1.0 K/uL   Eosinophils Relative 1 %   Eosinophils Absolute 0.2 0.0 - 0.5 K/uL   Basophils Relative 0 %   Basophils Absolute 0.1  0.0 - 0.1 K/uL   Immature Granulocytes 1 %   Abs Immature Granulocytes 0.12 (H) 0.00 - 0.07 K/uL    Comment: Performed at [facility], [address] [place], [place] [address]  Urinalysis, Routine w reflex microscopic -Urine, Clean Catch     Status: None   Collection Time: 12/26/22  8:37 AM  Result Value Ref Range   Color, Urine YELLOW YELLOW   APPearance CLEAR CLEAR   Specific Gravity, Urine 1.020 1.005 - 1.030   pH 6.0 5.0 - 8.0   Glucose, UA NEGATIVE NEGATIVE mg/dL   Hgb urine dipstick NEGATIVE NEGATIVE   Bilirubin Urine NEGATIVE NEGATIVE   Ketones, ur NEGATIVE NEGATIVE mg/dL   Protein, ur NEGATIVE NEGATIVE mg/dL   Nitrite NEGATIVE NEGATIVE   Leukocytes,Ua NEGATIVE NEGATIVE    Comment: Microscopic not done on urines with negative protein, blood, leukocytes, nitrite, or glucose < 500 mg/dL. Performed at [facility], [address], [place], [place] [address]   Lactic acid, plasma     Status: None   Collection Time: 12/26/22  9:03 AM  Result Value Ref Range   Lactic Acid, Venous 1.3 0.5 - 1.9 mmol/L    Comment: Performed at [facility], [address] [place], [place] [address]  Troponin I (High Sensitivity)     Status: None   Collection Time: 12/26/22  9:03 AM  Result Value Ref Range   Troponin I (High Sensitivity) 9 <18 ng/L    Comment: (NOTE) Elevated high sensitivity troponin I (hsTnI) values and significant  changes across serial measurements may suggest ACS but many other  chronic and acute conditions are known to elevate hsTnI results.  Refer to the "Links" section for chest pain algorithms and additional  guidance. Performed at [facility], [address], [place], [place] [address]    CT ABDOMEN PELVIS W CONTRAST  Result Date: 12/26/2022 CLINICAL DATA:  77 year old female with abdominal pain.  Postoperative day 3 status post cholecystectomy. EXAM: CT ABDOMEN AND PELVIS WITH CONTRAST TECHNIQUE: Multidetector CT imaging of the abdomen and pelvis was performed using the standard protocol following bolus administration of intravenous contrast. RADIATION DOSE REDUCTION: This exam was performed according to the departmental dose-optimization program which includes automated exposure control, adjustment of the mA and/or kV according to patient size and/or use of iterative reconstruction technique. CONTRAST:  75mL OMNIPAQUE IOHEXOL 350 MG/ML SOLN COMPARISON:  CT Abdomen and Pelvis 10/21/2022 and earlier. FINDINGS: Lower chest: Small layering pleural effusions are new. No pericardial effusion. Mild dependent lung base opacity most resembles atelectasis. Hepatobiliary: Nodular liver contour with small volume new perihepatic ascites (series 3, image 23), simple fluid density. However, gallbladder also surgically absent now. Surgical clips and expected postoperative changes in the gallbladder fossa. No discrete liver lesion. Pancreas: Atrophied. Spleen: Stable spleen at the upper limits of normal. Adrenals/Urinary Tract: Normal adrenal glands. Nonobstructed kidneys with symmetric renal enhancement and contrast excretion. Distended but otherwise unremarkable bladder. Stomach/Bowel: No dilated large or small bowel loops. Retained stool in the colon. Diverticulosis of the descending and sigmoid colon. Redundant right colon. Cecum on a lax mesentery in the anterior abdomen. Normal appendix arising on series 3, image 66. No abnormal small bowel. Small gastric hiatal hernia. Small volume fluid in the stomach. Decompressed duodenum. No free air. Vascular/Lymphatic: Aortoiliac calcified atherosclerosis. Major arterial structures are patent. Normal caliber abdominal aorta. Portal venous system is patent. No lymphadenopathy identified. Reproductive: Negative. Other: Trace new pelvis free fluid. Mild ventral  abdominal wall  postoperative changes with no adverse features. Musculoskeletal: Right hip arthroplasty. Advanced degeneration in the spine. Chronic T12 compression fracture. Stable visualized osseous structures. IMPRESSION: 1. Recent cholecystectomy with largely expected postoperative changes. A small volume of Ascites is new, nonspecific, but with underlying nodular liver contour raising the possibility of Cirrhosis. 2. Small layering pleural effusions are new with lung base atelectasis. 3. No other acute or inflammatory process identified in the abdomen or pelvis. Aortic Atherosclerosis (ICD10-I70.0). Electronically Signed   By: Odessa Fleming M.D.   On: 12/26/2022 12:34    Pending Labs Unresulted Labs (From admission, onward)     Start     Ordered   01/02/23 0500  Creatinine, serum  (enoxaparin (LOVENOX)    CrCl >/= 30 ml/min)  Weekly,   R     Comments: while on enoxaparin therapy    12/26/22 1536   12/27/22 0500  Comprehensive metabolic panel  Tomorrow morning,   R        12/26/22 1536   12/27/22 0500  CBC  Tomorrow morning,   R        12/26/22 1536            Vitals/Pain Today's Vitals   12/26/22 1457 12/26/22 1501 12/26/22 1510 12/26/22 1526  BP:   (!) 169/86   Pulse:   96   Resp:   (!) 31   Temp:      TempSrc:      SpO2: 90% 97% 97%   Weight:      Height:      PainSc:    Asleep    Isolation Precautions No active isolations  Medications Medications  enoxaparin (LOVENOX) injection 40 mg (has no administration in time range)  lactated ringers infusion (has no administration in time range)  acetaminophen (TYLENOL) tablet 650 mg (has no administration in time range)  HYDROmorphone (DILAUDID) injection 0.5 mg (has no administration in time range)  oxyCODONE (Oxy IR/ROXICODONE) immediate release tablet 5 mg (has no administration in time range)  diphenhydrAMINE (BENADRYL) 12.5 MG/5ML elixir 12.5 mg (has no administration in time range)    Or  diphenhydrAMINE (BENADRYL) injection 12.5 mg (has  no administration in time range)  ondansetron (ZOFRAN-ODT) disintegrating tablet 4 mg (has no administration in time range)    Or  ondansetron (ZOFRAN) injection 4 mg (has no administration in time range)  pantoprazole (PROTONIX) EC tablet 40 mg (has no administration in time range)  rosuvastatin (CRESTOR) tablet 5 mg (has no administration in time range)  sertraline (ZOLOFT) tablet 150 mg (has no administration in time range)  insulin glargine-yfgn (SEMGLEE) injection 5 Units (has no administration in time range)  insulin aspart (novoLOG) injection 0-9 Units (has no administration in time range)  fentaNYL (SUBLIMAZE) injection 50 mcg (50 mcg Intravenous Given 12/26/22 0856)  ondansetron (ZOFRAN) injection 4 mg (4 mg Intravenous Given 12/26/22 0856)  lactated ringers bolus 1,000 mL (0 mLs Intravenous Stopped 12/26/22 1141)  fentaNYL (SUBLIMAZE) injection 50 mcg (50 mcg Intravenous Given 12/26/22 0916)  HYDROmorphone (DILAUDID) injection 1 mg (1 mg Intravenous Given 12/26/22 1501)  iohexol (OMNIPAQUE) 350 MG/ML injection 75 mL (75 mLs Intravenous Contrast Given 12/26/22 1204)    Mobility walks     Focused Assessments Abdominal: tender to RUQ and RLQ going to R shoulder. Distended, taut. Incisions look WNL   R Recommendations: See Admitting Provider Note  Report given to:   Additional Notes: A&Ox4, ambulatory to bathroom w/ 1 assist. On 1.5L O2 via [place] to  maintain O2 sats d/t tachypnea w/ pain and d/t pain meds

## 2022-12-26 NOTE — ED Notes (Signed)
Pt now in purple zone

## 2022-12-26 NOTE — ED Notes (Signed)
Called [facility] staff to notify of plan for pt to go to MRI and then be transported to [facility]. Staff on [facility] agreeable to plan.

## 2022-12-26 NOTE — ED Notes (Signed)
Pt not in purple zone at this time

## 2022-12-26 NOTE — ED Notes (Signed)
MRI called and are sending transport for pt and report they will transport pt upstairs after MRI.

## 2022-12-26 NOTE — ED Triage Notes (Signed)
Pt c/o RLQ abdominal pain into right shoulder pain started last night. PT denies V/D. Pt had cholecystectomy 3 days ago.

## 2022-12-26 NOTE — ED Notes (Signed)
Pt transported to MRI at this time w/ family and all belongings.

## 2022-12-26 NOTE — ED Notes (Signed)
Pt and family updated on plan of care. Pt placed on 2L O2 via [place] to maintain O2 sats. Pt hooked up to cardiac monitor, BP cuff and pulse oximeter.

## 2022-12-26 NOTE — ED Notes (Signed)
Walked patient to the bathroom patient did well patient is now back in bed on the monitor with family at bedside help get patient pulled up in bed

## 2022-12-26 NOTE — ED Notes (Signed)
Notified Allen MD about need for admission orders and bed request and updated MD about pt being in purple zone.

## 2022-12-27 ENCOUNTER — Observation Stay (HOSPITAL_COMMUNITY): Payer: Medicare Other

## 2022-12-27 ENCOUNTER — Encounter (HOSPITAL_COMMUNITY): Payer: Self-pay | Admitting: Surgery

## 2022-12-27 DIAGNOSIS — K9189 Other postprocedural complications and disorders of digestive system: Secondary | ICD-10-CM

## 2022-12-27 DIAGNOSIS — E871 Hypo-osmolality and hyponatremia: Secondary | ICD-10-CM | POA: Diagnosis present

## 2022-12-27 DIAGNOSIS — Z8701 Personal history of pneumonia (recurrent): Secondary | ICD-10-CM | POA: Diagnosis not present

## 2022-12-27 DIAGNOSIS — K760 Fatty (change of) liver, not elsewhere classified: Secondary | ICD-10-CM | POA: Diagnosis present

## 2022-12-27 DIAGNOSIS — E785 Hyperlipidemia, unspecified: Secondary | ICD-10-CM | POA: Diagnosis present

## 2022-12-27 DIAGNOSIS — T8144XA Sepsis following a procedure, initial encounter: Secondary | ICD-10-CM | POA: Diagnosis not present

## 2022-12-27 DIAGNOSIS — L411 Pityriasis lichenoides chronica: Secondary | ICD-10-CM | POA: Diagnosis present

## 2022-12-27 DIAGNOSIS — Z4803 Encounter for change or removal of drains: Secondary | ICD-10-CM | POA: Diagnosis not present

## 2022-12-27 DIAGNOSIS — E1140 Type 2 diabetes mellitus with diabetic neuropathy, unspecified: Secondary | ICD-10-CM | POA: Diagnosis present

## 2022-12-27 DIAGNOSIS — R932 Abnormal findings on diagnostic imaging of liver and biliary tract: Secondary | ICD-10-CM | POA: Diagnosis not present

## 2022-12-27 DIAGNOSIS — E669 Obesity, unspecified: Secondary | ICD-10-CM | POA: Diagnosis present

## 2022-12-27 DIAGNOSIS — Z9049 Acquired absence of other specified parts of digestive tract: Secondary | ICD-10-CM | POA: Diagnosis not present

## 2022-12-27 DIAGNOSIS — Z96641 Presence of right artificial hip joint: Secondary | ICD-10-CM | POA: Diagnosis present

## 2022-12-27 DIAGNOSIS — G4730 Sleep apnea, unspecified: Secondary | ICD-10-CM | POA: Diagnosis present

## 2022-12-27 DIAGNOSIS — F32A Depression, unspecified: Secondary | ICD-10-CM | POA: Diagnosis present

## 2022-12-27 DIAGNOSIS — J9811 Atelectasis: Secondary | ICD-10-CM | POA: Diagnosis present

## 2022-12-27 DIAGNOSIS — E1165 Type 2 diabetes mellitus with hyperglycemia: Secondary | ICD-10-CM | POA: Diagnosis present

## 2022-12-27 DIAGNOSIS — E119 Type 2 diabetes mellitus without complications: Secondary | ICD-10-CM | POA: Diagnosis not present

## 2022-12-27 DIAGNOSIS — J90 Pleural effusion, not elsewhere classified: Secondary | ICD-10-CM | POA: Diagnosis present

## 2022-12-27 DIAGNOSIS — R1011 Right upper quadrant pain: Secondary | ICD-10-CM | POA: Diagnosis present

## 2022-12-27 DIAGNOSIS — D72829 Elevated white blood cell count, unspecified: Secondary | ICD-10-CM | POA: Diagnosis present

## 2022-12-27 DIAGNOSIS — Y838 Other surgical procedures as the cause of abnormal reaction of the patient, or of later complication, without mention of misadventure at the time of the procedure: Secondary | ICD-10-CM | POA: Diagnosis present

## 2022-12-27 DIAGNOSIS — R1013 Epigastric pain: Secondary | ICD-10-CM | POA: Diagnosis not present

## 2022-12-27 DIAGNOSIS — F419 Anxiety disorder, unspecified: Secondary | ICD-10-CM | POA: Diagnosis present

## 2022-12-27 DIAGNOSIS — Z882 Allergy status to sulfonamides status: Secondary | ICD-10-CM | POA: Diagnosis not present

## 2022-12-27 DIAGNOSIS — Z8249 Family history of ischemic heart disease and other diseases of the circulatory system: Secondary | ICD-10-CM | POA: Diagnosis not present

## 2022-12-27 DIAGNOSIS — R188 Other ascites: Secondary | ICD-10-CM | POA: Diagnosis present

## 2022-12-27 DIAGNOSIS — K838 Other specified diseases of biliary tract: Secondary | ICD-10-CM | POA: Diagnosis not present

## 2022-12-27 DIAGNOSIS — K219 Gastro-esophageal reflux disease without esophagitis: Secondary | ICD-10-CM | POA: Diagnosis present

## 2022-12-27 DIAGNOSIS — I10 Essential (primary) hypertension: Secondary | ICD-10-CM | POA: Diagnosis present

## 2022-12-27 DIAGNOSIS — Z794 Long term (current) use of insulin: Secondary | ICD-10-CM | POA: Diagnosis not present

## 2022-12-27 DIAGNOSIS — M1990 Unspecified osteoarthritis, unspecified site: Secondary | ICD-10-CM | POA: Diagnosis present

## 2022-12-27 DIAGNOSIS — Z7984 Long term (current) use of oral hypoglycemic drugs: Secondary | ICD-10-CM | POA: Diagnosis not present

## 2022-12-27 LAB — COMPREHENSIVE METABOLIC PANEL
ALT: 36 U/L (ref 0–44)
AST: 36 U/L (ref 15–41)
Albumin: 2.7 g/dL — ABNORMAL LOW (ref 3.5–5.0)
Alkaline Phosphatase: 60 U/L (ref 38–126)
Anion gap: 9 (ref 5–15)
BUN: 13 mg/dL (ref 8–23)
CO2: 24 mmol/L (ref 22–32)
Calcium: 9 mg/dL (ref 8.9–10.3)
Chloride: 96 mmol/L — ABNORMAL LOW (ref 98–111)
Creatinine, Ser: 0.76 mg/dL (ref 0.44–1.00)
GFR, Estimated: >60 mL/min (ref >60)
Glucose, Bld: 179 mg/dL — ABNORMAL HIGH (ref 70–99)
Potassium: 4.2 mmol/L (ref 3.5–5.1)
Sodium: 129 mmol/L — ABNORMAL LOW (ref 135–145)
Total Bilirubin: 0.9 mg/dL (ref 0.3–1.2)
Total Protein: 5.4 g/dL — ABNORMAL LOW (ref 6.5–8.1)

## 2022-12-27 LAB — PROTIME-INR
INR: 1.4 — ABNORMAL HIGH (ref 0.8–1.2)
Prothrombin Time: 17.7 seconds — ABNORMAL HIGH (ref 11.4–15.2)

## 2022-12-27 LAB — CBC
HCT: 35.5 % — ABNORMAL LOW (ref 36.0–46.0)
Hemoglobin: 11.6 g/dL — ABNORMAL LOW (ref 12.0–15.0)
MCH: 30.5 pg (ref 26.0–34.0)
MCHC: 32.7 g/dL (ref 30.0–36.0)
MCV: 93.4 fL (ref 80.0–100.0)
Platelets: 221 10*3/uL (ref 150–400)
RBC: 3.8 MIL/uL — ABNORMAL LOW (ref 3.87–5.11)
RDW: 17.2 % — ABNORMAL HIGH (ref 11.5–15.5)
WBC: 32.4 10*3/uL — ABNORMAL HIGH (ref 4.0–10.5)
nRBC: 0 % (ref 0.0–0.2)

## 2022-12-27 LAB — GLUCOSE, CAPILLARY
Glucose-Capillary: 198 mg/dL — ABNORMAL HIGH (ref 70–99)
Glucose-Capillary: 150 mg/dL — ABNORMAL HIGH (ref 70–99)
Glucose-Capillary: 140 mg/dL — ABNORMAL HIGH (ref 70–99)
Glucose-Capillary: 136 mg/dL — ABNORMAL HIGH (ref 70–99)
Glucose-Capillary: 139 mg/dL — ABNORMAL HIGH (ref 70–99)
Glucose-Capillary: 165 mg/dL — ABNORMAL HIGH (ref 70–99)

## 2022-12-27 MED ORDER — MIDAZOLAM HCL 2 MG/2ML IJ SOLN
INTRAMUSCULAR | Status: AC
  Filled 2022-12-27: qty 4

## 2022-12-27 MED ORDER — SODIUM CHLORIDE 0.9 % IV SOLN: INTRAVENOUS | Status: DC

## 2022-12-27 MED ORDER — SODIUM CHLORIDE 0.9% FLUSH
5.0000 mL | Freq: Three times a day (TID) | INTRAVENOUS | Status: DC
  Administered 2022-12-27 – 2022-12-30 (×8): 5 mL

## 2022-12-27 MED ORDER — MIDAZOLAM HCL 2 MG/2ML IJ SOLN
INTRAMUSCULAR | Status: AC | PRN
  Administered 2022-12-27: 1 mg via INTRAVENOUS
  Administered 2022-12-27: .5 mg via INTRAVENOUS

## 2022-12-27 MED ORDER — INDOMETHACIN 50 MG RE SUPP
100.0000 mg | Freq: Once | RECTAL | Status: DC
  Filled 2022-12-27: qty 2

## 2022-12-27 MED ORDER — FENTANYL CITRATE (PF) 100 MCG/2ML IJ SOLN
INTRAMUSCULAR | Status: AC | PRN
  Administered 2022-12-27: 25 ug via INTRAVENOUS
  Administered 2022-12-27: 50 ug via INTRAVENOUS

## 2022-12-27 MED ORDER — PIPERACILLIN-TAZOBACTAM 3.375 G IVPB
3.3750 g | Freq: Three times a day (TID) | INTRAVENOUS | Status: DC
  Administered 2022-12-27 – 2022-12-30 (×11): 3.375 g via INTRAVENOUS
  Filled 2022-12-27 (×12): qty 50

## 2022-12-27 MED ORDER — PANTOPRAZOLE SODIUM 40 MG IV SOLR
40.0000 mg | INTRAVENOUS | Status: DC
  Administered 2022-12-27 – 2022-12-30 (×4): 40 mg via INTRAVENOUS
  Filled 2022-12-27 (×4): qty 10

## 2022-12-27 MED ORDER — FENTANYL CITRATE (PF) 100 MCG/2ML IJ SOLN
INTRAMUSCULAR | Status: AC
  Filled 2022-12-27: qty 4

## 2022-12-27 MED ORDER — HYDROMORPHONE HCL 1 MG/ML IJ SOLN
1.0000 mg | INTRAMUSCULAR | Status: AC | PRN
  Administered 2022-12-27 (×2): 1 mg via INTRAVENOUS
  Filled 2022-12-27 (×2): qty 1

## 2022-12-27 MED ORDER — ACETAMINOPHEN 10 MG/ML IV SOLN
1000.0000 mg | Freq: Four times a day (QID) | INTRAVENOUS | Status: AC
  Administered 2022-12-27 – 2022-12-28 (×4): 1000 mg via INTRAVENOUS
  Filled 2022-12-27 (×4): qty 100

## 2022-12-27 NOTE — Progress Notes (Addendum)
Patient refused BiPAP for the night.  

## 2022-12-27 NOTE — Progress Notes (Signed)
Spoke with Dr. Meridee Score via secure chat after paging him. Wanted to clarify if patient could have clear liquids until midnight. Patient currently has 3 diet orders in. He stated as long as there were no contraindications clears until midnight would be fine and then NPO except sips with meds but to check with IR. Messaged Dr. Marliss Coots to clarify with him. He stated okay for clears until midnight.

## 2022-12-27 NOTE — Consult Note (Signed)
Chief Complaint: Patient was seen in consultation today for biloma drain placement Chief Complaint  Patient presents with   Abdominal Pain   at the request of Emelia Loron   Referring Physician(s): Emelia Loron   Supervising Physician: Marliss Coots  Patient Status: [facility] - In-pt  History of Present Illness: [name] [name] is a 77 y.o. female with PMHs of HTN, HLD, DM, DVT, s/p lap chole on 12/23/22 who was found to have bile leak and biloma, referred to IR for possible drain placement.   Patient presented to ED on 5/18 due to abdominal pain, she was found to have leukocytosis, VS stable. CT AP with showed small volume ascites and pleural effusions, general surgery was consulted and patient was admitted for further eval and management. Overnight her leukocytosis had significant increase to 32.4, patient  continue to have sever pain in RUQ, she underwent MR liver today which showed postoperative bile leak with bilomas in the gallbladder fossa and overlying the central aspect of the liver.   IR was consulted for image guided biloma drain placement, case reviewed and approve by Dr. Elby Showers.  Patient has been seen by GI as well, plan for ERCP and possible stent placement tomorrow.   Patient laying in bed, not in acute distress. Significant other at the bedside.  Significant RUQ pain with mild headache and nausea today.  Denise fever, chills, shortness of breath, cough, chest pain,vomiting, and bleeding.  Past Medical History:  Diagnosis Date   Anemia    Anxiety    Arthritis    Closed fracture of fifth metatarsal bone 05/13/2018   Deep vein thrombosis (HCC)    right calf - 05/2012    Depression    Diabetes mellitus without complication (HCC)    diet controlled    GERD (gastroesophageal reflux disease)    Hyperlipidemia    Patient denies.   Hypertension    Ejection fraction =>55% Left ventricular systolic function is normal. Left ventricular wall motion is normal      Lymphocytic colitis    Neuropathy    diabetic - in bilateral feet    Pityriasis lichenoides chronica    Pneumonia    Sleep apnea    bipap    Past Surgical History:  Procedure Laterality Date   BIOPSY  11/09/2022   Procedure: BIOPSY;  Surgeon: Lemar Lofty., MD;  Location: [facility];  Service: Gastroenterology;;   CHOLECYSTECTOMY N/A 12/23/2022   Procedure: LAPAROSCOPIC CHOLECYSTECTOMY WITH ICG DYE;  Surgeon: Gaynelle Adu, MD;  Location: [facility];  Service: General;  Laterality: N/A;   DILATION AND CURETTAGE OF UTERUS     ESOPHAGOGASTRODUODENOSCOPY N/A 11/09/2022   Procedure: ESOPHAGOGASTRODUODENOSCOPY (EGD);  Surgeon: Lemar Lofty., MD;  Location: [facility];  Service: Gastroenterology;  Laterality: N/A;   EYE SURGERY  07/2020   HAMMER TOE SURGERY     right hand surgery      due to blood infection    torn meniscus repair      right knee    TOTAL HIP ARTHROPLASTY  09/13/2012   Procedure: TOTAL HIP ARTHROPLASTY ANTERIOR APPROACH;  Surgeon: Shelda Pal, MD;  Location: [facility];  Service: Orthopedics;  Laterality: Right;   UPPER ESOPHAGEAL ENDOSCOPIC ULTRASOUND (EUS) N/A 11/09/2022   Procedure: UPPER ESOPHAGEAL ENDOSCOPIC ULTRASOUND (EUS);  Surgeon: Lemar Lofty., MD;  Location: [facility];  Service: Gastroenterology;  Laterality: N/A;    Allergies: Other, Thorazine [chlorpromazine], Bee venom, Farxiga [dapagliflozin], Semaglutide, and Sulfa antibiotics  Medications: Prior to Admission medications  Medication Sig Start Date End Date Taking? Authorizing Provider  cyclobenzaprine (FLEXERIL) 10 MG tablet TAKE 1 TABLET BY MOUTH AT BEDTIME 09/07/22   Burns, Bobette Mo, MD  insulin glargine (LANTUS SOLOSTAR) 100 UNIT/ML Solostar Pen Inject 20 Units into the skin 2 (two) times daily. 10/14/22   Pincus Sanes, MD  lisinopril (ZESTRIL) 20 MG tablet Take 1 tablet (20 mg total) by mouth daily. 10/20/21   Lennette Bihari, MD  metFORMIN (GLUCOPHAGE-XR) 500 MG 24 hr tablet  TAKE 1 TABLET BY MOUTH TWICE A DAY 07/24/22   Pincus Sanes, MD  methotrexate (RHEUMATREX) 2.5 MG tablet Take 20 mg by mouth every Sunday at 6pm. 06/24/16   [provider]  omeprazole (PRILOSEC) 40 MG capsule Take 1 capsule (40 mg total) by mouth 2 (two) times daily. 11/11/22   Imogene Burn, MD  oxyCODONE-acetaminophen (PERCOCET) 10-325 MG tablet Take 1 tablet by mouth every 8 (eight) hours as needed for pain. 09/14/22   [provider]  potassium chloride (KLOR-CON M) 10 MEQ tablet Take 2 tablets (20 mEq total) by mouth daily. Patient taking differently: Take 10 mEq by mouth 2 (two) times daily. 09/18/22   Pincus Sanes, MD  rosuvastatin (CRESTOR) 5 MG tablet TAKE 1 TABLET(5 MG) BY MOUTH DAILY Patient taking differently: Take 5 mg by mouth every evening. 03/19/22   Pincus Sanes, MD  sertraline (ZOLOFT) 100 MG tablet Take 1.5 tablets (150 mg total) by mouth daily. 09/21/22   Pincus Sanes, MD     Family History  Problem Relation Age of Onset   Hypertension Mother     Social History   Socioeconomic History   Marital status: Divorced    Spouse name: Not on file   Number of children: Not on file   Years of education: Not on file   Highest education level: Not on file  Occupational History   Not on file  Tobacco Use   Smoking status: Never   Smokeless tobacco: Never  Substance and Sexual Activity   Alcohol use: Yes    Comment: occasional wine    Drug use: No   Sexual activity: Not on file  Other Topics Concern   Not on file  Social History Narrative   She is widowed. She has 2 children, 2 grandchildren. She does drink occasional alcohol . There is no tobacco . She does not have a routine exercise program   Social Determinants of Health   Financial Resource Strain: Low Risk  (10/01/2022)   Overall Financial Resource Strain (CARDIA)    Difficulty of Paying Living Expenses: Not hard at all  Food Insecurity: No Food Insecurity (12/26/2022)   Hunger Vital Sign     Worried About Running Out of Food in the Last Year: Never true    Ran Out of Food in the Last Year: Never true  Transportation Needs: No Transportation Needs (12/26/2022)   PRAPARE - Administrator, Civil Service (Medical): No    Lack of Transportation (Non-Medical): No  Physical Activity: Insufficiently Active (10/01/2022)   Exercise Vital Sign    Days of Exercise per Week: 2 days    Minutes of Exercise per Session: 60 min  Stress: No Stress Concern Present (10/01/2022)   [employer] of Occupational Health - Occupational Stress Questionnaire    Feeling of Stress : Not at all  Social Connections: Socially Integrated (10/01/2022)   Social Connection and Isolation Panel [NHANES]    Frequency of Communication with Friends  and Family: Twice a week    Frequency of Social Gatherings with Friends and Family: Twice a week    Attends Religious Services: More than 4 times per year    Active Member of [employer] or Organizations: Yes    Attends Engineer, structural: More than 4 times per year    Marital Status: Living with partner     Review of Systems: A 12 point ROS discussed and pertinent positives are indicated in the HPI above.  All other systems are negative.  Vital Signs: BP (!) 145/81 (BP Location: Left Arm)   Pulse 88   Temp 98.3 F (36.8 C) (Oral)   Resp 14   Ht 5\' 6"  (1.676 m)   Wt 199 lb 1.2 oz (90.3 kg)   SpO2 100%   BMI 32.13 kg/m    Physical Exam Vitals reviewed.  Constitutional:      General: She is not in acute distress.    Appearance: She is not ill-appearing.  HENT:     Head: Normocephalic.  Cardiovascular:     Rate and Rhythm: Normal rate and regular rhythm.  Pulmonary:     Effort: Pulmonary effort is normal.     Breath sounds: Normal breath sounds.  Abdominal:     General: Abdomen is flat.     Palpations: Abdomen is soft.     Tenderness: There is abdominal tenderness.     Comments: TTP RUQ   Skin:    General: Skin is warm and dry.      Coloration: Skin is not cyanotic or jaundiced.  Neurological:     Mental Status: She is alert and oriented to person, place, and time.  Psychiatric:        Mood and Affect: Mood normal. Mood is not anxious.        Behavior: Behavior normal.     MD Evaluation Airway: WNL Heart: WNL Abdomen: WNL Chest/ Lungs: WNL ASA  Classification: 3 Mallampati/Airway Score: Two  Imaging: MR LIVER W WO CONTRAST  Result Date: 12/27/2022 CLINICAL DATA:  77 year old female status post cholecystectomy with abdominal pain. Evaluate for potential bile leak. EXAM: MRI ABDOMEN WITHOUT AND WITH CONTRAST TECHNIQUE: Multiplanar multisequence MR imaging of the abdomen was performed both before and after the administration of intravenous contrast. CONTRAST:  9mL EOVIST GADOXETATE DISODIUM 0.25 MOL/L IV SOLN COMPARISON:  No prior abdominal MRI. CT of the abdomen and pelvis 12/26/2022. FINDINGS: Lower chest: Small right pleural effusion lying dependently with areas of passive atelectasis in the lower lobes of the lungs bilaterally. Hepatobiliary: Liver has a shrunken appearance and nodular contour, indicative of underlying cirrhosis. No suspicious cystic or solid hepatic lesions. No intra or extrahepatic biliary ductal dilatation. Patient is status post cholecystectomy. Small amount of fluid in the gallbladder fossa is T1 hypointense and heterogeneously T2 hyperintense. On 20 minutes delayed post gadolinium images with Eovist there is a small amount of excreted contrast material which extends into the lateral aspect of this collection (best appreciated on axial image 55 of series 22). In addition, more abundant excretion of contrast is noted into a perihepatic fluid collection anterolaterally to the liver best appreciated on axial image 52 of series 22, indicative of postoperative bile leak with biloma in the gallbladder fossa and perihepatic region. This perihepatic fluid collection is T1 hypointense, T2 hyperintense and  contains excreted contrast material and 20 minute post gadolinium delayed images, surrounding much of the central aspect of the liver adjacent to portions of segments 4 A, 4B, 5  and 8. Common bile duct measures 4 mm in the porta hepatis. No filling defect in the common bile duct to suggest choledocholithiasis. Pancreas: No pancreatic mass. No pancreatic ductal dilatation. No peripancreatic fluid collections or inflammatory changes. Spleen:  Unremarkable. Adrenals/Urinary Tract: Bilateral kidneys and adrenal glands are normal in appearance. No hydroureteronephrosis. Stomach/Bowel: Visualized portions are unremarkable. Vascular/Lymphatic: No aneurysm identified in the visualized abdominal vasculature. No lymphadenopathy noted in the abdomen. Other:  Trace volume of perihepatic ascites. Musculoskeletal: No aggressive appearing osseous lesions are noted in the visualized portions of the skeleton. IMPRESSION: 1. Study is positive for postoperative bile leak with bilomas noted in the gallbladder fossa and overlying the central aspect of the liver, as detailed above. 2. Trace volume of perihepatic ascites. 3. Morphologic changes in the liver suggestive of cirrhosis. 4. Small right pleural effusion lying dependently. Passive areas of subsegmental atelectasis in the lower lobes of the lungs bilaterally. These results will be called to the ordering clinician or representative by the Radiologist Assistant, and communication documented in the PACS or [employer]. Electronically Signed   By: Trudie Reed M.D.   On: 12/27/2022 07:56   CT ABDOMEN PELVIS W CONTRAST  Result Date: 12/26/2022 CLINICAL DATA:  77 year old female with abdominal pain. Postoperative day 3 status post cholecystectomy. EXAM: CT ABDOMEN AND PELVIS WITH CONTRAST TECHNIQUE: Multidetector CT imaging of the abdomen and pelvis was performed using the standard protocol following bolus administration of intravenous contrast. RADIATION DOSE REDUCTION:  This exam was performed according to the departmental dose-optimization program which includes automated exposure control, adjustment of the mA and/or kV according to patient size and/or use of iterative reconstruction technique. CONTRAST:  75mL OMNIPAQUE IOHEXOL 350 MG/ML SOLN COMPARISON:  CT Abdomen and Pelvis 10/21/2022 and earlier. FINDINGS: Lower chest: Small layering pleural effusions are new. No pericardial effusion. Mild dependent lung base opacity most resembles atelectasis. Hepatobiliary: Nodular liver contour with small volume new perihepatic ascites (series 3, image 23), simple fluid density. However, gallbladder also surgically absent now. Surgical clips and expected postoperative changes in the gallbladder fossa. No discrete liver lesion. Pancreas: Atrophied. Spleen: Stable spleen at the upper limits of normal. Adrenals/Urinary Tract: Normal adrenal glands. Nonobstructed kidneys with symmetric renal enhancement and contrast excretion. Distended but otherwise unremarkable bladder. Stomach/Bowel: No dilated large or small bowel loops. Retained stool in the colon. Diverticulosis of the descending and sigmoid colon. Redundant right colon. Cecum on a lax mesentery in the anterior abdomen. Normal appendix arising on series 3, image 66. No abnormal small bowel. Small gastric hiatal hernia. Small volume fluid in the stomach. Decompressed duodenum. No free air. Vascular/Lymphatic: Aortoiliac calcified atherosclerosis. Major arterial structures are patent. Normal caliber abdominal aorta. Portal venous system is patent. No lymphadenopathy identified. Reproductive: Negative. Other: Trace new pelvis free fluid. Mild ventral abdominal wall postoperative changes with no adverse features. Musculoskeletal: Right hip arthroplasty. Advanced degeneration in the spine. Chronic T12 compression fracture. Stable visualized osseous structures. IMPRESSION: 1. Recent cholecystectomy with largely expected postoperative changes. A  small volume of Ascites is new, nonspecific, but with underlying nodular liver contour raising the possibility of Cirrhosis. 2. Small layering pleural effusions are new with lung base atelectasis. 3. No other acute or inflammatory process identified in the abdomen or pelvis. Aortic Atherosclerosis (ICD10-I70.0). Electronically Signed   By: Odessa Fleming M.D.   On: 12/26/2022 12:34   NM Gastric Emptying  Result Date: 12/01/2022 CLINICAL DATA:  Concern for gastroparesis. EXAM: NUCLEAR MEDICINE GASTRIC EMPTYING SCAN TECHNIQUE: After oral ingestion of radiolabeled  meal, sequential abdominal images were obtained for 120 minutes. Residual percentage of activity remaining within the stomach was calculated at 60 and 120 minutes. RADIOPHARMACEUTICALS:  2.0 mCi Tc-56m sulfur colloid in standardized meal COMPARISON:  CT October 21, 2022 FINDINGS: Expected location of the stomach in the left upper quadrant. Ingested meal empties the stomach gradually over the course of the study with 13% retention at 60 min and 95% retention at 120 min (normal retention less than 30% at a 120 min). IMPRESSION: No scintigraphic evidence of delayed gastric emptying. Electronically Signed   By: Maudry Mayhew M.D.   On: 12/01/2022 16:49    Labs:  CBC: Recent Labs    10/21/22 1530 12/22/22 1205 12/26/22 0837 12/27/22 0228  WBC 12.2* 12.2* 17.3* 32.4*  HGB 11.2* 10.2* 11.3* 11.6*  HCT 35.2* 32.5* 35.5* 35.5*  PLT 214 198 223 221    COAGS: Recent Labs    12/27/22 0742  INR 1.4*    BMP: Recent Labs    11/09/22 1329 12/22/22 1205 12/26/22 0837 12/27/22 0228  NA 133* 134* 132* 129*  K 4.5 4.3 4.2 4.2  CL 98 101 99 96*  CO2 29 25 23 24   GLUCOSE 105* 74 116* 179*  BUN 22 19 16 13   CALCIUM 9.5 9.4 9.5 9.0  CREATININE 0.87 0.82 0.96 0.76  GFRNONAA >60 >60 >60 >60    LIVER FUNCTION TESTS: Recent Labs    10/21/22 1530 11/09/22 1329 12/26/22 0837 12/27/22 0228  BILITOT 0.5 0.5 0.3 0.9  AST 31 20 33 36  ALT 21 15  27  36  ALKPHOS 64 54 63 60  PROT 7.0 6.4* 6.1* 5.4*  ALBUMIN 3.8 3.4* 3.2* 2.7*    TUMOR MARKERS: No results for input(s): "AFPTM", "CEA", "CA199", "CHROMGRNA" in the last 8760 hours.  Assessment and Plan: 77 y.o. female with s/p lap chole on 5/15 present with RUQ pain, bile leak and biloma.   NPO since MN VSS CBC WBC 32.4, hgb 11.6, plt 221  LFTs wnl INR 1.4 today  Not on AC/AP  On Zosyn  Risks and benefits discussed with the patient including bleeding, infection, damage to adjacent structures, bowel perforation/fistula connection, and sepsis.  All of the patient's questions were answered, patient is agreeable to proceed. Consent signed and in chart.   Thank you for this interesting consult.  I greatly enjoyed meeting [name] [name] and look forward to participating in their care.  A copy of this report was sent to the requesting provider on this date.  Electronically Signed: Willette Brace, PA-C 12/27/2022, 10:55 AM   I spent a total of 20 Minutes    in face to face in clinical consultation, greater than 50% of which was counseling/coordinating care for biloma drain placement.   This chart was dictated using voice recognition software.  Despite best efforts to proofread,  errors can occur which can change the documentation meaning.

## 2022-12-27 NOTE — Procedures (Signed)
Interventional Radiology Procedure Note  Procedure:  1) CT guided gallbladder fossa biloma drain placement 2) CT guided perihepatic fluid collection drain placement   Findings: Please refer to procedural dictation for full description. 8 Fr pigtail drain placed in biloma cavity, aspiration of approximately 40 mL bilious fluid, to bulb suction.  10 Fr pigtail drain placed in perihepatic collection, aspiration of approximately 1 L bilious fluid, to bag drainage.  Fluid culture and bile sample sent.    Complications: None immediate  Estimated Blood Loss: < 5 mL  Recommendations: Keep biloma (Drain #2, inferior, blue) drain to bulb suction until if culture results negative, then convert to bag. Keep perihepatic (Drain #1, superior, white) to bag drainage. Agree with forthcoming ERCP with stent placement. IR will follow.   Marliss Coots, MD

## 2022-12-27 NOTE — Plan of Care (Addendum)
A/Ox4 and on 1.5L nasal cannula. Had significant post op abdominal pain throughout the shift. Oxy and IV dilaudid alternated several times during the shift. Ice pack also applied. Pain initially fairly controlled (patient able to sleep about 3 hours or so in between doses) until around 0540 am, when pain shot up to a 10 and would not go down significantly. Called [facility] for additional pain intervention x2 calls. No new pain orders at this time, MD stated that team is en route. Order placed for drain placement. Purewick in place overnight for ease of using restroom. Running LR at 75. Q4 blood sugars with coverage given.   Problem: Education: Goal: Ability to describe self-care measures that may prevent or decrease complications (Diabetes Survival Skills Education) will improve 12/27/2022 0647 by Chrisandra Netters, RN Outcome: Not Progressing 12/27/2022 0647 by Chrisandra Netters, RN Outcome: Progressing Goal: Individualized Educational Video(s) 12/27/2022 0647 by Chrisandra Netters, RN Outcome: Not Progressing 12/27/2022 0647 by Chrisandra Netters, RN Outcome: Progressing   Problem: Coping: Goal: Ability to adjust to condition or change in health will improve 12/27/2022 0647 by Chrisandra Netters, RN Outcome: Not Progressing 12/27/2022 0647 by Chrisandra Netters, RN Outcome: Progressing   Problem: Fluid Volume: Goal: Ability to maintain a balanced intake and output will improve 12/27/2022 0647 by Chrisandra Netters, RN Outcome: Not Progressing 12/27/2022 0647 by Chrisandra Netters, RN Outcome: Progressing   Problem: Health Behavior/Discharge Planning: Goal: Ability to identify and utilize available resources and services will improve 12/27/2022 0647 by Chrisandra Netters, RN Outcome: Not Progressing 12/27/2022 0647 by Chrisandra Netters, RN Outcome: Progressing Goal: Ability to manage health-related needs will improve 12/27/2022 0647 by Chrisandra Netters, RN Outcome: Not  Progressing 12/27/2022 0647 by Chrisandra Netters, RN Outcome: Progressing   Problem: Metabolic: Goal: Ability to maintain appropriate glucose levels will improve 12/27/2022 0647 by Chrisandra Netters, RN Outcome: Not Progressing 12/27/2022 0647 by Chrisandra Netters, RN Outcome: Progressing   Problem: Nutritional: Goal: Maintenance of adequate nutrition will improve 12/27/2022 0647 by Chrisandra Netters, RN Outcome: Not Progressing 12/27/2022 0647 by Chrisandra Netters, RN Outcome: Progressing Goal: Progress toward achieving an optimal weight will improve 12/27/2022 0647 by Chrisandra Netters, RN Outcome: Not Progressing 12/27/2022 0647 by Chrisandra Netters, RN Outcome: Progressing   Problem: Skin Integrity: Goal: Risk for impaired skin integrity will decrease 12/27/2022 0647 by Chrisandra Netters, RN Outcome: Not Progressing 12/27/2022 0647 by Chrisandra Netters, RN Outcome: Progressing   Problem: Tissue Perfusion: Goal: Adequacy of tissue perfusion will improve 12/27/2022 0647 by Chrisandra Netters, RN Outcome: Not Progressing 12/27/2022 0647 by Chrisandra Netters, RN Outcome: Progressing   Problem: Education: Goal: Knowledge of General Education information will improve Description: Including pain rating scale, medication(s)/side effects and non-pharmacologic comfort measures 12/27/2022 0647 by Chrisandra Netters, RN Outcome: Not Progressing 12/27/2022 0647 by Chrisandra Netters, RN Outcome: Progressing   Problem: Health Behavior/Discharge Planning: Goal: Ability to manage health-related needs will improve 12/27/2022 0647 by Chrisandra Netters, RN Outcome: Not Progressing 12/27/2022 0647 by Chrisandra Netters, RN Outcome: Progressing   Problem: Clinical Measurements: Goal: Ability to maintain clinical measurements within normal limits will improve 12/27/2022 0647 by Chrisandra Netters, RN Outcome: Not Progressing 12/27/2022 0647 by Chrisandra Netters,  RN Outcome: Progressing Goal: Will remain free from infection 12/27/2022 0647 by Chrisandra Netters, RN Outcome: Not Progressing 12/27/2022 0647 by Chrisandra Netters, RN Outcome: Progressing Goal: Diagnostic test results  will improve 12/27/2022 0647 by Chrisandra Netters, RN Outcome: Not Progressing 12/27/2022 0647 by Chrisandra Netters, RN Outcome: Progressing Goal: Respiratory complications will improve 12/27/2022 0647 by Chrisandra Netters, RN Outcome: Not Progressing 12/27/2022 0647 by Chrisandra Netters, RN Outcome: Progressing Goal: Cardiovascular complication will be avoided 12/27/2022 0647 by Chrisandra Netters, RN Outcome: Not Progressing 12/27/2022 0647 by Chrisandra Netters, RN Outcome: Progressing   Problem: Activity: Goal: Risk for activity intolerance will decrease 12/27/2022 0647 by Chrisandra Netters, RN Outcome: Not Progressing 12/27/2022 0647 by Chrisandra Netters, RN Outcome: Progressing   Problem: Nutrition: Goal: Adequate nutrition will be maintained 12/27/2022 0647 by Chrisandra Netters, RN Outcome: Not Progressing 12/27/2022 0647 by Chrisandra Netters, RN Outcome: Progressing   Problem: Coping: Goal: Level of anxiety will decrease 12/27/2022 0647 by Chrisandra Netters, RN Outcome: Not Progressing 12/27/2022 0647 by Chrisandra Netters, RN Outcome: Progressing   Problem: Elimination: Goal: Will not experience complications related to bowel motility 12/27/2022 0647 by Chrisandra Netters, RN Outcome: Not Progressing 12/27/2022 0647 by Chrisandra Netters, RN Outcome: Progressing Goal: Will not experience complications related to urinary retention 12/27/2022 0647 by Chrisandra Netters, RN Outcome: Not Progressing 12/27/2022 0647 by Chrisandra Netters, RN Outcome: Progressing   Problem: Pain Managment: Goal: General experience of comfort will improve 12/27/2022 0647 by Chrisandra Netters, RN Outcome: Not Progressing 12/27/2022 0647 by  Chrisandra Netters, RN Outcome: Progressing   Problem: Safety: Goal: Ability to remain free from injury will improve 12/27/2022 0647 by Chrisandra Netters, RN Outcome: Not Progressing 12/27/2022 0647 by Chrisandra Netters, RN Outcome: Progressing   Problem: Skin Integrity: Goal: Risk for impaired skin integrity will decrease 12/27/2022 0647 by Chrisandra Netters, RN Outcome: Not Progressing 12/27/2022 0647 by Chrisandra Netters, RN Outcome: Progressing

## 2022-12-27 NOTE — Progress Notes (Signed)
Subjective/Chief Complaint: C/o ab pain   Objective: Vital signs in last 24 hours: Temp:  [98 F (36.7 C)-98.5 F (36.9 C)] 98.3 F (36.8 C) (05/19 0829) Pulse Rate:  [79-101] 88 (05/19 0829) Resp:  [14-36] 14 (05/19 0829) BP: (145-180)/(70-86) 145/81 (05/19 0829) SpO2:  [88 %-100 %] 100 % (05/19 0829) Last BM Date : 12/25/22  Intake/Output from previous day: 05/18 0701 - 05/19 0700 In: 240 [P.O.:240] Out: -  Intake/Output this shift: No intake/output data recorded.  General nad Cv regular Pulm effort normal Ab tender ruq/rlq/epigastrium nondistended some ecchymosis around incisions  Lab Results:  Recent Labs    12/26/22 0837 12/27/22 0228  WBC 17.3* 32.4*  HGB 11.3* 11.6*  HCT 35.5* 35.5*  PLT 223 221   BMET Recent Labs    12/26/22 0837 12/27/22 0228  NA 132* 129*  K 4.2 4.2  CL 99 96*  CO2 23 24  GLUCOSE 116* 179*  BUN 16 13  CREATININE 0.96 0.76  CALCIUM 9.5 9.0   PT/INR Recent Labs    12/27/22 0742  LABPROT 17.7*  INR 1.4*   ABG No results for input(s): "PHART", "HCO3" in the last 72 hours.  Invalid input(s): "PCO2", "PO2"  Studies/Results: MR LIVER W WO CONTRAST  Result Date: 12/27/2022 CLINICAL DATA:  77 year old female status post cholecystectomy with abdominal pain. Evaluate for potential bile leak. EXAM: MRI ABDOMEN WITHOUT AND WITH CONTRAST TECHNIQUE: Multiplanar multisequence MR imaging of the abdomen was performed both before and after the administration of intravenous contrast. CONTRAST:  9mL EOVIST GADOXETATE DISODIUM 0.25 MOL/L IV SOLN COMPARISON:  No prior abdominal MRI. CT of the abdomen and pelvis 12/26/2022. FINDINGS: Lower chest: Small right pleural effusion lying dependently with areas of passive atelectasis in the lower lobes of the lungs bilaterally. Hepatobiliary: Liver has a shrunken appearance and nodular contour, indicative of underlying cirrhosis. No suspicious cystic or solid hepatic lesions. No intra or  extrahepatic biliary ductal dilatation. Patient is status post cholecystectomy. Small amount of fluid in the gallbladder fossa is T1 hypointense and heterogeneously T2 hyperintense. On 20 minutes delayed post gadolinium images with Eovist there is a small amount of excreted contrast material which extends into the lateral aspect of this collection (best appreciated on axial image 55 of series 22). In addition, more abundant excretion of contrast is noted into a perihepatic fluid collection anterolaterally to the liver best appreciated on axial image 52 of series 22, indicative of postoperative bile leak with biloma in the gallbladder fossa and perihepatic region. This perihepatic fluid collection is T1 hypointense, T2 hyperintense and contains excreted contrast material and 20 minute post gadolinium delayed images, surrounding much of the central aspect of the liver adjacent to portions of segments 4 A, 4B, 5 and 8. Common bile duct measures 4 mm in the porta hepatis. No filling defect in the common bile duct to suggest choledocholithiasis. Pancreas: No pancreatic mass. No pancreatic ductal dilatation. No peripancreatic fluid collections or inflammatory changes. Spleen:  Unremarkable. Adrenals/Urinary Tract: Bilateral kidneys and adrenal glands are normal in appearance. No hydroureteronephrosis. Stomach/Bowel: Visualized portions are unremarkable. Vascular/Lymphatic: No aneurysm identified in the visualized abdominal vasculature. No lymphadenopathy noted in the abdomen. Other:  Trace volume of perihepatic ascites. Musculoskeletal: No aggressive appearing osseous lesions are noted in the visualized portions of the skeleton. IMPRESSION: 1. Study is positive for postoperative bile leak with bilomas noted in the gallbladder fossa and overlying the central aspect of the liver, as detailed above. 2. Trace volume of perihepatic ascites.  3. Morphologic changes in the liver suggestive of cirrhosis. 4. Small right pleural  effusion lying dependently. Passive areas of subsegmental atelectasis in the lower lobes of the lungs bilaterally. These results will be called to the ordering clinician or representative by the Radiologist Assistant, and communication documented in the PACS or [employer]. Electronically Signed   By: Trudie Reed M.D.   On: 12/27/2022 07:56   CT ABDOMEN PELVIS W CONTRAST  Result Date: 12/26/2022 CLINICAL DATA:  77 year old female with abdominal pain. Postoperative day 3 status post cholecystectomy. EXAM: CT ABDOMEN AND PELVIS WITH CONTRAST TECHNIQUE: Multidetector CT imaging of the abdomen and pelvis was performed using the standard protocol following bolus administration of intravenous contrast. RADIATION DOSE REDUCTION: This exam was performed according to the departmental dose-optimization program which includes automated exposure control, adjustment of the mA and/or kV according to patient size and/or use of iterative reconstruction technique. CONTRAST:  75mL OMNIPAQUE IOHEXOL 350 MG/ML SOLN COMPARISON:  CT Abdomen and Pelvis 10/21/2022 and earlier. FINDINGS: Lower chest: Small layering pleural effusions are new. No pericardial effusion. Mild dependent lung base opacity most resembles atelectasis. Hepatobiliary: Nodular liver contour with small volume new perihepatic ascites (series 3, image 23), simple fluid density. However, gallbladder also surgically absent now. Surgical clips and expected postoperative changes in the gallbladder fossa. No discrete liver lesion. Pancreas: Atrophied. Spleen: Stable spleen at the upper limits of normal. Adrenals/Urinary Tract: Normal adrenal glands. Nonobstructed kidneys with symmetric renal enhancement and contrast excretion. Distended but otherwise unremarkable bladder. Stomach/Bowel: No dilated large or small bowel loops. Retained stool in the colon. Diverticulosis of the descending and sigmoid colon. Redundant right colon. Cecum on a lax mesentery in the  anterior abdomen. Normal appendix arising on series 3, image 66. No abnormal small bowel. Small gastric hiatal hernia. Small volume fluid in the stomach. Decompressed duodenum. No free air. Vascular/Lymphatic: Aortoiliac calcified atherosclerosis. Major arterial structures are patent. Normal caliber abdominal aorta. Portal venous system is patent. No lymphadenopathy identified. Reproductive: Negative. Other: Trace new pelvis free fluid. Mild ventral abdominal wall postoperative changes with no adverse features. Musculoskeletal: Right hip arthroplasty. Advanced degeneration in the spine. Chronic T12 compression fracture. Stable visualized osseous structures. IMPRESSION: 1. Recent cholecystectomy with largely expected postoperative changes. A small volume of Ascites is new, nonspecific, but with underlying nodular liver contour raising the possibility of Cirrhosis. 2. Small layering pleural effusions are new with lung base atelectasis. 3. No other acute or inflammatory process identified in the abdomen or pelvis. Aortic Atherosclerosis (ICD10-I70.0). Electronically Signed   By: Odessa Fleming M.D.   On: 12/26/2022 12:34    Anti-infectives: Anti-infectives (From admission, onward)    Start     Dose/Rate Route Frequency Ordered Stop   12/27/22 0915  piperacillin-tazobactam (ZOSYN) IVPB 3.375 g        3.375 g 12.5 mL/hr over 240 Minutes Intravenous Every 8 hours 12/27/22 0815         Assessment/Plan: S/p lap chole 5/15 EW Bile leak -MR and clinical picture c/w bile leak -IR order placed already -will consult GI today -NPO -Hold pharm dvt prophlaxis -adjust pain meds DM-SSI HTN-home meds Hold lovenox today   Emelia Loron 12/27/2022

## 2022-12-27 NOTE — H&P (View-Only) (Signed)
Referring Provider: [facility] Primary Care Physician:  Pincus Sanes, MD Primary Gastroenterologist:  Dr. Leonides Schanz  Reason for Consultation:  Bile leak  HPI: [name] [name] is a 77 y.o. female who underwent a laparoscopic cholecystectomy on 5/15 by Dr. Andrey Campanile for symptomatic cholelithiasis.  Her surgery was uneventful and she was discharged home postoperatively.  She said she initially felt very well after surgery with minimal pain until Friday night when she woke up with pain.  She started having severe right-sided and right upper quadrant abdominal pain with radiation to her right shoulder.  She has also had a lot of nausea with some vomiting and minimal appetite.    MR liver:  IMPRESSION: 1. Study is positive for postoperative bile leak with bilomas noted in the gallbladder fossa and overlying the central aspect of the liver, as detailed above. 2. Trace volume of perihepatic ascites. 3. Morphologic changes in the liver suggestive of cirrhosis. 4. Small right pleural effusion lying dependently. Passive areas of subsegmental atelectasis in the lower lobes of the lungs bilaterally.  WBC count elevated at 32.4K.  On Zosyn.  LFTs normal.  IR to see for drain.  Past Medical History:  Diagnosis Date   Anemia    Anxiety    Arthritis    Closed fracture of fifth metatarsal bone 05/13/2018   Deep vein thrombosis (HCC)    right calf - 05/2012    Depression    Diabetes mellitus without complication (HCC)    diet controlled    GERD (gastroesophageal reflux disease)    Hyperlipidemia    Patient denies.   Hypertension    Ejection fraction =>55% Left ventricular systolic function is normal. Left ventricular wall motion is normal     Lymphocytic colitis    Neuropathy    diabetic - in bilateral feet    Pityriasis lichenoides chronica    Pneumonia    Sleep apnea    bipap    Past Surgical History:  Procedure Laterality Date   BIOPSY  11/09/2022   Procedure: BIOPSY;  Surgeon: Lemar Lofty., MD;  Location: [facility];  Service: Gastroenterology;;   CHOLECYSTECTOMY N/A 12/23/2022   Procedure: LAPAROSCOPIC CHOLECYSTECTOMY WITH ICG DYE;  Surgeon: Gaynelle Adu, MD;  Location: [facility];  Service: General;  Laterality: N/A;   DILATION AND CURETTAGE OF UTERUS     ESOPHAGOGASTRODUODENOSCOPY N/A 11/09/2022   Procedure: ESOPHAGOGASTRODUODENOSCOPY (EGD);  Surgeon: Lemar Lofty., MD;  Location: [facility];  Service: Gastroenterology;  Laterality: N/A;   EYE SURGERY  07/2020   HAMMER TOE SURGERY     right hand surgery      due to blood infection    torn meniscus repair      right knee    TOTAL HIP ARTHROPLASTY  09/13/2012   Procedure: TOTAL HIP ARTHROPLASTY ANTERIOR APPROACH;  Surgeon: Shelda Pal, MD;  Location: [facility];  Service: Orthopedics;  Laterality: Right;   UPPER ESOPHAGEAL ENDOSCOPIC ULTRASOUND (EUS) N/A 11/09/2022   Procedure: UPPER ESOPHAGEAL ENDOSCOPIC ULTRASOUND (EUS);  Surgeon: Lemar Lofty., MD;  Location: [facility];  Service: Gastroenterology;  Laterality: N/A;    Prior to Admission medications   Medication Sig Start Date End Date Taking? Authorizing Provider  cyclobenzaprine (FLEXERIL) 10 MG tablet TAKE 1 TABLET BY MOUTH AT BEDTIME 09/07/22   Burns, Bobette Mo, MD  insulin glargine (LANTUS SOLOSTAR) 100 UNIT/ML Solostar Pen Inject 20 Units into the skin 2 (two) times daily. 10/14/22   Pincus Sanes, MD  lisinopril (ZESTRIL) 20  MG tablet Take 1 tablet (20 mg total) by mouth daily. 10/20/21   Lennette Bihari, MD  metFORMIN (GLUCOPHAGE-XR) 500 MG 24 hr tablet TAKE 1 TABLET BY MOUTH TWICE A DAY 07/24/22   Pincus Sanes, MD  methotrexate (RHEUMATREX) 2.5 MG tablet Take 20 mg by mouth every Sunday at 6pm. 06/24/16   [provider]  omeprazole (PRILOSEC) 40 MG capsule Take 1 capsule (40 mg total) by mouth 2 (two) times daily. 11/11/22   Imogene Burn, MD  oxyCODONE-acetaminophen (PERCOCET) 10-325 MG tablet Take 1 tablet by mouth every 8  (eight) hours as needed for pain. 09/14/22   [provider]  potassium chloride (KLOR-CON M) 10 MEQ tablet Take 2 tablets (20 mEq total) by mouth daily. Patient taking differently: Take 10 mEq by mouth 2 (two) times daily. 09/18/22   Pincus Sanes, MD  rosuvastatin (CRESTOR) 5 MG tablet TAKE 1 TABLET(5 MG) BY MOUTH DAILY Patient taking differently: Take 5 mg by mouth every evening. 03/19/22   Pincus Sanes, MD  sertraline (ZOLOFT) 100 MG tablet Take 1.5 tablets (150 mg total) by mouth daily. 09/21/22   Pincus Sanes, MD    Current Facility-Administered Medications  Medication Dose Route Frequency Provider Last Rate Last Admin   0.9 %  sodium chloride infusion   Intravenous Continuous Emelia Loron, MD       acetaminophen (OFIRMEV) IV 1,000 mg  1,000 mg Intravenous Q6H Emelia Loron, MD       diphenhydrAMINE (BENADRYL) 12.5 MG/5ML elixir 12.5 mg  12.5 mg Oral Q6H PRN Fritzi Mandes, MD       Or   diphenhydrAMINE (BENADRYL) injection 12.5 mg  12.5 mg Intravenous Q6H PRN Fritzi Mandes, MD       HYDROmorphone (DILAUDID) injection 1 mg  1 mg Intravenous Q2H PRN Emelia Loron, MD       insulin aspart (novoLOG) injection 0-9 Units  0-9 Units Subcutaneous Q4H Fritzi Mandes, MD   1 Units at 12/27/22 9604   insulin glargine-yfgn [facility]) injection 5 Units  5 Units Subcutaneous QHS Fritzi Mandes, MD   5 Units at 12/26/22 2206   ondansetron (ZOFRAN-ODT) disintegrating tablet 4 mg  4 mg Oral Q6H PRN Fritzi Mandes, MD       Or   ondansetron [facility]) injection 4 mg  4 mg Intravenous Q6H PRN Fritzi Mandes, MD       oxyCODONE (Oxy IR/ROXICODONE) immediate release tablet 5 mg  5 mg Oral Q4H PRN Fritzi Mandes, MD   5 mg at 12/27/22 0645   pantoprazole (PROTONIX) injection 40 mg  40 mg Intravenous Q24H Emelia Loron, MD       piperacillin-tazobactam (ZOSYN) IVPB 3.375 g  3.375 g Intravenous Q8H Sophronia Simas L, MD       sertraline (ZOLOFT) tablet 150 mg  150 mg Oral Daily  Fritzi Mandes, MD        Allergies as of 12/26/2022 - Review Complete 12/26/2022  Allergen Reaction Noted   Other Shortness Of Breath 10/06/2014   Thorazine [chlorpromazine] Other (See Comments) 05/26/2021   Bee venom Swelling 05/01/2018   Farxiga [dapagliflozin] Swelling 10/24/2019   Semaglutide  12/27/2020   Sulfa antibiotics Hives 09/06/2012    Family History  Problem Relation Age of Onset   Hypertension Mother     Social History   Socioeconomic History   Marital status: Divorced    Spouse name: Not on file   Number of children: Not  on file   Years of education: Not on file   Highest education level: Not on file  Occupational History   Not on file  Tobacco Use   Smoking status: Never   Smokeless tobacco: Never  Substance and Sexual Activity   Alcohol use: Yes    Comment: occasional wine    Drug use: No   Sexual activity: Not on file  Other Topics Concern   Not on file  Social History Narrative   She is widowed. She has 2 children, 2 grandchildren. She does drink occasional alcohol . There is no tobacco . She does not have a routine exercise program   Social Determinants of Health   Financial Resource Strain: Low Risk  (10/01/2022)   Overall Financial Resource Strain (CARDIA)    Difficulty of Paying Living Expenses: Not hard at all  Food Insecurity: No Food Insecurity (12/26/2022)   Hunger Vital Sign    Worried About Running Out of Food in the Last Year: Never true    Ran Out of Food in the Last Year: Never true  Transportation Needs: No Transportation Needs (12/26/2022)   PRAPARE - Administrator, Civil Service (Medical): No    Lack of Transportation (Non-Medical): No  Physical Activity: Insufficiently Active (10/01/2022)   Exercise Vital Sign    Days of Exercise per Week: 2 days    Minutes of Exercise per Session: 60 min  Stress: No Stress Concern Present (10/01/2022)   [employer] of Occupational Health - Occupational Stress Questionnaire     Feeling of Stress : Not at all  Social Connections: Socially Integrated (10/01/2022)   Social Connection and Isolation Panel [NHANES]    Frequency of Communication with Friends and Family: Twice a week    Frequency of Social Gatherings with Friends and Family: Twice a week    Attends Religious Services: More than 4 times per year    Active Member of [employer] or Organizations: Yes    Attends Banker Meetings: More than 4 times per year    Marital Status: Living with partner  Intimate Partner Violence: Not At Risk (12/26/2022)   Humiliation, Afraid, Rape, and Kick questionnaire    Fear of Current or Ex-Partner: No    Emotionally Abused: No    Physically Abused: No    Sexually Abused: No    Review of Systems: ROS is O/W negative except as mentioned in HPI.  Physical Exam: Vital signs in last 24 hours: Temp:  [98 F (36.7 C)-98.5 F (36.9 C)] 98.3 F (36.8 C) (05/19 0829) Pulse Rate:  [79-101] 88 (05/19 0829) Resp:  [14-36] 14 (05/19 0829) BP: (145-180)/(70-86) 145/81 (05/19 0829) SpO2:  [88 %-100 %] 100 % (05/19 0829) Last BM Date : 12/25/22 General:  Alert, Well-developed, well-nourished, pleasant and cooperative in NAD, but uncomfortable Head:  Normocephalic and atraumatic. Eyes:  Sclera clear, no icterus.  Conjunctiva pink. Ears:  Normal auditory acuity. Mouth:  No deformity or lesions.  Lips are dry. Lungs:  Clear throughout to auscultation.   No wheezes, crackles, or rhonchi.  Heart:  Regular rate and rhythm; no murmurs, clicks, rubs, or gallops. Abdomen:  Soft, non-distended.  BS present.  TTP> in the RUQ.   Msk:  Symmetrical without gross deformities. Pulses:  Normal pulses noted. Extremities:  Without clubbing or edema. Neurologic:  Alert and oriented x 4;  grossly normal neurologically. Skin:  Intact without significant lesions or rashes. Psych:  Alert and cooperative. Normal mood and affect.  Intake/Output  from previous day: 05/18 0701 - 05/19  0700 In: 240 [P.O.:240] Out: -   Lab Results: Recent Labs    12/26/22 0837 12/27/22 0228  WBC 17.3* 32.4*  HGB 11.3* 11.6*  HCT 35.5* 35.5*  PLT 223 221   BMET Recent Labs    12/26/22 0837 12/27/22 0228  NA 132* 129*  K 4.2 4.2  CL 99 96*  CO2 23 24  GLUCOSE 116* 179*  BUN 16 13  CREATININE 0.96 0.76  CALCIUM 9.5 9.0   LFT Recent Labs    12/27/22 0228  PROT 5.4*  ALBUMIN 2.7*  AST 36  ALT 36  ALKPHOS 60  BILITOT 0.9   PT/INR Recent Labs    12/27/22 0742  LABPROT 17.7*  INR 1.4*   Studies/Results: MR LIVER W WO CONTRAST  Result Date: 12/27/2022 CLINICAL DATA:  77 year old female status post cholecystectomy with abdominal pain. Evaluate for potential bile leak. EXAM: MRI ABDOMEN WITHOUT AND WITH CONTRAST TECHNIQUE: Multiplanar multisequence MR imaging of the abdomen was performed both before and after the administration of intravenous contrast. CONTRAST:  9mL EOVIST GADOXETATE DISODIUM 0.25 MOL/L IV SOLN COMPARISON:  No prior abdominal MRI. CT of the abdomen and pelvis 12/26/2022. FINDINGS: Lower chest: Small right pleural effusion lying dependently with areas of passive atelectasis in the lower lobes of the lungs bilaterally. Hepatobiliary: Liver has a shrunken appearance and nodular contour, indicative of underlying cirrhosis. No suspicious cystic or solid hepatic lesions. No intra or extrahepatic biliary ductal dilatation. Patient is status post cholecystectomy. Small amount of fluid in the gallbladder fossa is T1 hypointense and heterogeneously T2 hyperintense. On 20 minutes delayed post gadolinium images with Eovist there is a small amount of excreted contrast material which extends into the lateral aspect of this collection (best appreciated on axial image 55 of series 22). In addition, more abundant excretion of contrast is noted into a perihepatic fluid collection anterolaterally to the liver best appreciated on axial image 52 of series 22, indicative of  postoperative bile leak with biloma in the gallbladder fossa and perihepatic region. This perihepatic fluid collection is T1 hypointense, T2 hyperintense and contains excreted contrast material and 20 minute post gadolinium delayed images, surrounding much of the central aspect of the liver adjacent to portions of segments 4 A, 4B, 5 and 8. Common bile duct measures 4 mm in the porta hepatis. No filling defect in the common bile duct to suggest choledocholithiasis. Pancreas: No pancreatic mass. No pancreatic ductal dilatation. No peripancreatic fluid collections or inflammatory changes. Spleen:  Unremarkable. Adrenals/Urinary Tract: Bilateral kidneys and adrenal glands are normal in appearance. No hydroureteronephrosis. Stomach/Bowel: Visualized portions are unremarkable. Vascular/Lymphatic: No aneurysm identified in the visualized abdominal vasculature. No lymphadenopathy noted in the abdomen. Other:  Trace volume of perihepatic ascites. Musculoskeletal: No aggressive appearing osseous lesions are noted in the visualized portions of the skeleton. IMPRESSION: 1. Study is positive for postoperative bile leak with bilomas noted in the gallbladder fossa and overlying the central aspect of the liver, as detailed above. 2. Trace volume of perihepatic ascites. 3. Morphologic changes in the liver suggestive of cirrhosis. 4. Small right pleural effusion lying dependently. Passive areas of subsegmental atelectasis in the lower lobes of the lungs bilaterally. These results will be called to the ordering clinician or representative by the Radiologist Assistant, and communication documented in the PACS or [employer]. Electronically Signed   By: Trudie Reed M.D.   On: 12/27/2022 07:56   CT ABDOMEN PELVIS W CONTRAST  Result Date:  12/26/2022 CLINICAL DATA:  77 year old female with abdominal pain. Postoperative day 3 status post cholecystectomy. EXAM: CT ABDOMEN AND PELVIS WITH CONTRAST TECHNIQUE: Multidetector CT  imaging of the abdomen and pelvis was performed using the standard protocol following bolus administration of intravenous contrast. RADIATION DOSE REDUCTION: This exam was performed according to the departmental dose-optimization program which includes automated exposure control, adjustment of the mA and/or kV according to patient size and/or use of iterative reconstruction technique. CONTRAST:  75mL OMNIPAQUE IOHEXOL 350 MG/ML SOLN COMPARISON:  CT Abdomen and Pelvis 10/21/2022 and earlier. FINDINGS: Lower chest: Small layering pleural effusions are new. No pericardial effusion. Mild dependent lung base opacity most resembles atelectasis. Hepatobiliary: Nodular liver contour with small volume new perihepatic ascites (series 3, image 23), simple fluid density. However, gallbladder also surgically absent now. Surgical clips and expected postoperative changes in the gallbladder fossa. No discrete liver lesion. Pancreas: Atrophied. Spleen: Stable spleen at the upper limits of normal. Adrenals/Urinary Tract: Normal adrenal glands. Nonobstructed kidneys with symmetric renal enhancement and contrast excretion. Distended but otherwise unremarkable bladder. Stomach/Bowel: No dilated large or small bowel loops. Retained stool in the colon. Diverticulosis of the descending and sigmoid colon. Redundant right colon. Cecum on a lax mesentery in the anterior abdomen. Normal appendix arising on series 3, image 66. No abnormal small bowel. Small gastric hiatal hernia. Small volume fluid in the stomach. Decompressed duodenum. No free air. Vascular/Lymphatic: Aortoiliac calcified atherosclerosis. Major arterial structures are patent. Normal caliber abdominal aorta. Portal venous system is patent. No lymphadenopathy identified. Reproductive: Negative. Other: Trace new pelvis free fluid. Mild ventral abdominal wall postoperative changes with no adverse features. Musculoskeletal: Right hip arthroplasty. Advanced degeneration in the  spine. Chronic T12 compression fracture. Stable visualized osseous structures. IMPRESSION: 1. Recent cholecystectomy with largely expected postoperative changes. A small volume of Ascites is new, nonspecific, but with underlying nodular liver contour raising the possibility of Cirrhosis. 2. Small layering pleural effusions are new with lung base atelectasis. 3. No other acute or inflammatory process identified in the abdomen or pelvis. Aortic Atherosclerosis (ICD10-I70.0). Electronically Signed   By: Odessa Fleming M.D.   On: 12/26/2022 12:34    IMPRESSION:  *Bile leak s/p lap chole on 5/15.  Bile leak seen on MR as there is no HIDA available on the weekend.  WBC count elevated at 32.4K.  On Zosyn.  LFTs normal.  IR to see for drain. *Hyponatremia:  Na+ 129  PLAN: -She is going for drain with IR today. -Will likely plan for ERCP with stent on Monday, 5/20. -Continue abx, zosyn. -Trend labs.  Princella Pellegrini. Zehr  12/27/2022, 8:52 AM  GI ATTENDING  History, laboratories, x-rays, operative note all personally reviewed.  Patient personally seen and examined.  Significant other in bed room.  Agree with comprehensive consultation note as outlined above.  The patient presents with postoperative bile leak 4 days after laparoscopic cholecystectomy.  Her chief complaint is pain.  Imaging confirms biloma.  White blood cell count 32.4.  No fevers.  Tender abdomen on exam.  On empiric Zosyn.  Agree with plans for IR drainage of biloma today followed by ERCP with stent placement tomorrow with Dr. Leone Payor.  I discussed with him in detail the nature of postoperative bile leaks and their management (short-term, and long-term).  Wilhemina Bonito. Eda Keys., M.D. [facility] Division of Gastroenterology

## 2022-12-27 NOTE — Consult Note (Addendum)
 Referring Provider: [facility] Primary Care Physician:  Burns, Stacy J, MD Primary Gastroenterologist:  Dr. Dorsey  Reason for Consultation:  Bile leak  HPI: [name] [name] is a 77 y.o. female who underwent a laparoscopic cholecystectomy on 5/15 by Dr. Wilson for symptomatic cholelithiasis.  Her surgery was uneventful and she was discharged home postoperatively.  She said she initially felt very well after surgery with minimal pain until Friday night when she woke up with pain.  She started having severe right-sided and right upper quadrant abdominal pain with radiation to her right shoulder.  She has also had a lot of nausea with some vomiting and minimal appetite.    MR liver:  IMPRESSION: 1. Study is positive for postoperative bile leak with bilomas noted in the gallbladder fossa and overlying the central aspect of the liver, as detailed above. 2. Trace volume of perihepatic ascites. 3. Morphologic changes in the liver suggestive of cirrhosis. 4. Small right pleural effusion lying dependently. Passive areas of subsegmental atelectasis in the lower lobes of the lungs bilaterally.  WBC count elevated at 32.4K.  On Zosyn.  LFTs normal.  IR to see for drain.  Past Medical History:  Diagnosis Date   Anemia    Anxiety    Arthritis    Closed fracture of fifth metatarsal bone 05/13/2018   Deep vein thrombosis (HCC)    right calf - 05/2012    Depression    Diabetes mellitus without complication (HCC)    diet controlled    GERD (gastroesophageal reflux disease)    Hyperlipidemia    Patient denies.   Hypertension    Ejection fraction =>55% Left ventricular systolic function is normal. Left ventricular wall motion is normal     Lymphocytic colitis    Neuropathy    diabetic - in bilateral feet    Pityriasis lichenoides chronica    Pneumonia    Sleep apnea    bipap    Past Surgical History:  Procedure Laterality Date   BIOPSY  11/09/2022   Procedure: BIOPSY;  Surgeon: Mansouraty,  Gabriel Jr., MD;  Location: [facility];  Service: Gastroenterology;;   CHOLECYSTECTOMY N/A 12/23/2022   Procedure: LAPAROSCOPIC CHOLECYSTECTOMY WITH ICG DYE;  Surgeon: Wilson, Eric, MD;  Location: [facility];  Service: General;  Laterality: N/A;   DILATION AND CURETTAGE OF UTERUS     ESOPHAGOGASTRODUODENOSCOPY N/A 11/09/2022   Procedure: ESOPHAGOGASTRODUODENOSCOPY (EGD);  Surgeon: Mansouraty, Gabriel Jr., MD;  Location: [facility];  Service: Gastroenterology;  Laterality: N/A;   EYE SURGERY  07/2020   HAMMER TOE SURGERY     right hand surgery      due to blood infection    torn meniscus repair      right knee    TOTAL HIP ARTHROPLASTY  09/13/2012   Procedure: TOTAL HIP ARTHROPLASTY ANTERIOR APPROACH;  Surgeon: Matthew D Olin, MD;  Location: [facility];  Service: Orthopedics;  Laterality: Right;   UPPER ESOPHAGEAL ENDOSCOPIC ULTRASOUND (EUS) N/A 11/09/2022   Procedure: UPPER ESOPHAGEAL ENDOSCOPIC ULTRASOUND (EUS);  Surgeon: Mansouraty, Gabriel Jr., MD;  Location: [facility];  Service: Gastroenterology;  Laterality: N/A;    Prior to Admission medications   Medication Sig Start Date End Date Taking? Authorizing Provider  cyclobenzaprine (FLEXERIL) 10 MG tablet TAKE 1 TABLET BY MOUTH AT BEDTIME 09/07/22   Burns, Stacy J, MD  insulin glargine (LANTUS SOLOSTAR) 100 UNIT/ML Solostar Pen Inject 20 Units into the skin 2 (two) times daily. 10/14/22   Burns, Stacy J, MD  lisinopril (ZESTRIL) 20   MG tablet Take 1 tablet (20 mg total) by mouth daily. 10/20/21   Kelly, Thomas A, MD  metFORMIN (GLUCOPHAGE-XR) 500 MG 24 hr tablet TAKE 1 TABLET BY MOUTH TWICE A DAY 07/24/22   Burns, Stacy J, MD  methotrexate (RHEUMATREX) 2.5 MG tablet Take 20 mg by mouth every Sunday at 6pm. 06/24/16   [provider]  omeprazole (PRILOSEC) 40 MG capsule Take 1 capsule (40 mg total) by mouth 2 (two) times daily. 11/11/22   Dorsey, Ying C, MD  oxyCODONE-acetaminophen (PERCOCET) 10-325 MG tablet Take 1 tablet by mouth every 8  (eight) hours as needed for pain. 09/14/22   [provider]  potassium chloride (KLOR-CON M) 10 MEQ tablet Take 2 tablets (20 mEq total) by mouth daily. Patient taking differently: Take 10 mEq by mouth 2 (two) times daily. 09/18/22   Burns, Stacy J, MD  rosuvastatin (CRESTOR) 5 MG tablet TAKE 1 TABLET(5 MG) BY MOUTH DAILY Patient taking differently: Take 5 mg by mouth every evening. 03/19/22   Burns, Stacy J, MD  sertraline (ZOLOFT) 100 MG tablet Take 1.5 tablets (150 mg total) by mouth daily. 09/21/22   Burns, Stacy J, MD    Current Facility-Administered Medications  Medication Dose Route Frequency Provider Last Rate Last Admin   0.9 %  sodium chloride infusion   Intravenous Continuous Wakefield, Matthew, MD       acetaminophen (OFIRMEV) IV 1,000 mg  1,000 mg Intravenous Q6H Wakefield, Matthew, MD       diphenhydrAMINE (BENADRYL) 12.5 MG/5ML elixir 12.5 mg  12.5 mg Oral Q6H PRN Allen, Shelby L, MD       Or   diphenhydrAMINE (BENADRYL) injection 12.5 mg  12.5 mg Intravenous Q6H PRN Allen, Shelby L, MD       HYDROmorphone (DILAUDID) injection 1 mg  1 mg Intravenous Q2H PRN Wakefield, Matthew, MD       insulin aspart (novoLOG) injection 0-9 Units  0-9 Units Subcutaneous Q4H Allen, Shelby L, MD   1 Units at 12/27/22 0628   insulin glargine-yfgn (SEMGLEE) injection 5 Units  5 Units Subcutaneous QHS Allen, Shelby L, MD   5 Units at 12/26/22 2206   ondansetron (ZOFRAN-ODT) disintegrating tablet 4 mg  4 mg Oral Q6H PRN Allen, Shelby L, MD       Or   ondansetron (ZOFRAN) injection 4 mg  4 mg Intravenous Q6H PRN Allen, Shelby L, MD       oxyCODONE (Oxy IR/ROXICODONE) immediate release tablet 5 mg  5 mg Oral Q4H PRN Allen, Shelby L, MD   5 mg at 12/27/22 0645   pantoprazole (PROTONIX) injection 40 mg  40 mg Intravenous Q24H Wakefield, Matthew, MD       piperacillin-tazobactam (ZOSYN) IVPB 3.375 g  3.375 g Intravenous Q8H Allen, Shelby L, MD       sertraline (ZOLOFT) tablet 150 mg  150 mg Oral Daily  Allen, Shelby L, MD        Allergies as of 12/26/2022 - Review Complete 12/26/2022  Allergen Reaction Noted   Other Shortness Of Breath 10/06/2014   Thorazine [chlorpromazine] Other (See Comments) 05/26/2021   Bee venom Swelling 05/01/2018   Farxiga [dapagliflozin] Swelling 10/24/2019   Semaglutide  12/27/2020   Sulfa antibiotics Hives 09/06/2012    Family History  Problem Relation Age of Onset   Hypertension Mother     Social History   Socioeconomic History   Marital status: Divorced    Spouse name: Not on file   Number of children: Not   on file   Years of education: Not on file   Highest education level: Not on file  Occupational History   Not on file  Tobacco Use   Smoking status: Never   Smokeless tobacco: Never  Substance and Sexual Activity   Alcohol use: Yes    Comment: occasional wine    Drug use: No   Sexual activity: Not on file  Other Topics Concern   Not on file  Social History Narrative   She is widowed. She has 2 children, 2 grandchildren. She does drink occasional alcohol . There is no tobacco . She does not have a routine exercise program   Social Determinants of Health   Financial Resource Strain: Low Risk  (10/01/2022)   Overall Financial Resource Strain (CARDIA)    Difficulty of Paying Living Expenses: Not hard at all  Food Insecurity: No Food Insecurity (12/26/2022)   Hunger Vital Sign    Worried About Running Out of Food in the Last Year: Never true    Ran Out of Food in the Last Year: Never true  Transportation Needs: No Transportation Needs (12/26/2022)   PRAPARE - Transportation    Lack of Transportation (Medical): No    Lack of Transportation (Non-Medical): No  Physical Activity: Insufficiently Active (10/01/2022)   Exercise Vital Sign    Days of Exercise per Week: 2 days    Minutes of Exercise per Session: 60 min  Stress: No Stress Concern Present (10/01/2022)   [employer] - Occupational Stress Questionnaire     Feeling of Stress : Not at all  Social Connections: Socially Integrated (10/01/2022)   Social Connection and Isolation Panel [NHANES]    Frequency of Communication with Friends and Family: Twice a week    Frequency of Social Gatherings with Friends and Family: Twice a week    Attends Religious Services: More than 4 times per year    Active Member of Clubs or Organizations: Yes    Attends Club or Organization Meetings: More than 4 times per year    Marital Status: Living with partner  Intimate Partner Violence: Not At Risk (12/26/2022)   Humiliation, Afraid, Rape, and Kick questionnaire    Fear of Current or Ex-Partner: No    Emotionally Abused: No    Physically Abused: No    Sexually Abused: No    Review of Systems: ROS is O/W negative except as mentioned in HPI.  Physical Exam: Vital signs in last 24 hours: Temp:  [98 F (36.7 C)-98.5 F (36.9 C)] 98.3 F (36.8 C) (05/19 0829) Pulse Rate:  [79-101] 88 (05/19 0829) Resp:  [14-36] 14 (05/19 0829) BP: (145-180)/(70-86) 145/81 (05/19 0829) SpO2:  [88 %-100 %] 100 % (05/19 0829) Last BM Date : 12/25/22 General:  Alert, Well-developed, well-nourished, pleasant and cooperative in NAD, but uncomfortable Head:  Normocephalic and atraumatic. Eyes:  Sclera clear, no icterus.  Conjunctiva pink. Ears:  Normal auditory acuity. Mouth:  No deformity or lesions.  Lips are dry. Lungs:  Clear throughout to auscultation.   No wheezes, crackles, or rhonchi.  Heart:  Regular rate and rhythm; no murmurs, clicks, rubs, or gallops. Abdomen:  Soft, non-distended.  BS present.  TTP> in the RUQ.   Msk:  Symmetrical without gross deformities. Pulses:  Normal pulses noted. Extremities:  Without clubbing or edema. Neurologic:  Alert and oriented x 4;  grossly normal neurologically. Skin:  Intact without significant lesions or rashes. Psych:  Alert and cooperative. Normal mood and affect.  Intake/Output   from previous day: 05/18 0701 - 05/19  0700 In: 240 [P.O.:240] Out: -   Lab Results: Recent Labs    12/26/22 0837 12/27/22 0228  WBC 17.3* 32.4*  HGB 11.3* 11.6*  HCT 35.5* 35.5*  PLT 223 221   BMET Recent Labs    12/26/22 0837 12/27/22 0228  NA 132* 129*  K 4.2 4.2  CL 99 96*  CO2 23 24  GLUCOSE 116* 179*  BUN 16 13  CREATININE 0.96 0.76  CALCIUM 9.5 9.0   LFT Recent Labs    12/27/22 0228  PROT 5.4*  ALBUMIN 2.7*  AST 36  ALT 36  ALKPHOS 60  BILITOT 0.9   PT/INR Recent Labs    12/27/22 0742  LABPROT 17.7*  INR 1.4*   Studies/Results: MR LIVER W WO CONTRAST  Result Date: 12/27/2022 CLINICAL DATA:  76-year-old female status post cholecystectomy with abdominal pain. Evaluate for potential bile leak. EXAM: MRI ABDOMEN WITHOUT AND WITH CONTRAST TECHNIQUE: Multiplanar multisequence MR imaging of the abdomen was performed both before and after the administration of intravenous contrast. CONTRAST:  9mL EOVIST GADOXETATE DISODIUM 0.25 MOL/L IV SOLN COMPARISON:  No prior abdominal MRI. CT of the abdomen and pelvis 12/26/2022. FINDINGS: Lower chest: Small right pleural effusion lying dependently with areas of passive atelectasis in the lower lobes of the lungs bilaterally. Hepatobiliary: Liver has a shrunken appearance and nodular contour, indicative of underlying cirrhosis. No suspicious cystic or solid hepatic lesions. No intra or extrahepatic biliary ductal dilatation. Patient is status post cholecystectomy. Small amount of fluid in the gallbladder fossa is T1 hypointense and heterogeneously T2 hyperintense. On 20 minutes delayed post gadolinium images with Eovist there is a small amount of excreted contrast material which extends into the lateral aspect of this collection (best appreciated on axial image 55 of series 22). In addition, more abundant excretion of contrast is noted into a perihepatic fluid collection anterolaterally to the liver best appreciated on axial image 52 of series 22, indicative of  postoperative bile leak with biloma in the gallbladder fossa and perihepatic region. This perihepatic fluid collection is T1 hypointense, T2 hyperintense and contains excreted contrast material and 20 minute post gadolinium delayed images, surrounding much of the central aspect of the liver adjacent to portions of segments 4 A, 4B, 5 and 8. Common bile duct measures 4 mm in the porta hepatis. No filling defect in the common bile duct to suggest choledocholithiasis. Pancreas: No pancreatic mass. No pancreatic ductal dilatation. No peripancreatic fluid collections or inflammatory changes. Spleen:  Unremarkable. Adrenals/Urinary Tract: Bilateral kidneys and adrenal glands are normal in appearance. No hydroureteronephrosis. Stomach/Bowel: Visualized portions are unremarkable. Vascular/Lymphatic: No aneurysm identified in the visualized abdominal vasculature. No lymphadenopathy noted in the abdomen. Other:  Trace volume of perihepatic ascites. Musculoskeletal: No aggressive appearing osseous lesions are noted in the visualized portions of the skeleton. IMPRESSION: 1. Study is positive for postoperative bile leak with bilomas noted in the gallbladder fossa and overlying the central aspect of the liver, as detailed above. 2. Trace volume of perihepatic ascites. 3. Morphologic changes in the liver suggestive of cirrhosis. 4. Small right pleural effusion lying dependently. Passive areas of subsegmental atelectasis in the lower lobes of the lungs bilaterally. These results will be called to the ordering clinician or representative by the Radiologist Assistant, and communication documented in the PACS or [facility] Dashboard. Electronically Signed   By: Daniel  Entrikin M.D.   On: 12/27/2022 07:56   CT ABDOMEN PELVIS W CONTRAST  Result Date:   12/26/2022 CLINICAL DATA:  76-year-old female with abdominal pain. Postoperative day 3 status post cholecystectomy. EXAM: CT ABDOMEN AND PELVIS WITH CONTRAST TECHNIQUE: Multidetector CT  imaging of the abdomen and pelvis was performed using the standard protocol following bolus administration of intravenous contrast. RADIATION DOSE REDUCTION: This exam was performed according to the departmental dose-optimization program which includes automated exposure control, adjustment of the mA and/or kV according to patient size and/or use of iterative reconstruction technique. CONTRAST:  75mL OMNIPAQUE IOHEXOL 350 MG/ML SOLN COMPARISON:  CT Abdomen and Pelvis 10/21/2022 and earlier. FINDINGS: Lower chest: Small layering pleural effusions are new. No pericardial effusion. Mild dependent lung base opacity most resembles atelectasis. Hepatobiliary: Nodular liver contour with small volume new perihepatic ascites (series 3, image 23), simple fluid density. However, gallbladder also surgically absent now. Surgical clips and expected postoperative changes in the gallbladder fossa. No discrete liver lesion. Pancreas: Atrophied. Spleen: Stable spleen at the upper limits of normal. Adrenals/Urinary Tract: Normal adrenal glands. Nonobstructed kidneys with symmetric renal enhancement and contrast excretion. Distended but otherwise unremarkable bladder. Stomach/Bowel: No dilated large or small bowel loops. Retained stool in the colon. Diverticulosis of the descending and sigmoid colon. Redundant right colon. Cecum on a lax mesentery in the anterior abdomen. Normal appendix arising on series 3, image 66. No abnormal small bowel. Small gastric hiatal hernia. Small volume fluid in the stomach. Decompressed duodenum. No free air. Vascular/Lymphatic: Aortoiliac calcified atherosclerosis. Major arterial structures are patent. Normal caliber abdominal aorta. Portal venous system is patent. No lymphadenopathy identified. Reproductive: Negative. Other: Trace new pelvis free fluid. Mild ventral abdominal wall postoperative changes with no adverse features. Musculoskeletal: Right hip arthroplasty. Advanced degeneration in the  spine. Chronic T12 compression fracture. Stable visualized osseous structures. IMPRESSION: 1. Recent cholecystectomy with largely expected postoperative changes. A small volume of Ascites is new, nonspecific, but with underlying nodular liver contour raising the possibility of Cirrhosis. 2. Small layering pleural effusions are new with lung base atelectasis. 3. No other acute or inflammatory process identified in the abdomen or pelvis. Aortic Atherosclerosis (ICD10-I70.0). Electronically Signed   By: H  Hall M.D.   On: 12/26/2022 12:34    IMPRESSION:  *Bile leak s/p lap chole on 5/15.  Bile leak seen on MR as there is no HIDA available on the weekend.  WBC count elevated at 32.4K.  On Zosyn.  LFTs normal.  IR to see for drain. *Hyponatremia:  Na+ 129  PLAN: -She is going for drain with IR today. -Will likely plan for ERCP with stent on Monday, 5/20. -Continue abx, zosyn. -Trend labs.  Jessica D. Zehr  12/27/2022, 8:52 AM  GI ATTENDING  History, laboratories, x-rays, operative note all personally reviewed.  Patient personally seen and examined.  Significant other in bed room.  Agree with comprehensive consultation note as outlined above.  The patient presents with postoperative bile leak 4 days after laparoscopic cholecystectomy.  Her chief complaint is pain.  Imaging confirms biloma.  White blood cell count 32.4.  No fevers.  Tender abdomen on exam.  On empiric Zosyn.  Agree with plans for IR drainage of biloma today followed by ERCP with stent placement tomorrow with Dr. Gessner.  I discussed with him in detail the nature of postoperative bile leaks and their management (short-term, and long-term).  Tyller Bowlby N. Royer Cristobal, Jr., M.D. [facility] Division of Gastroenterology    

## 2022-12-28 ENCOUNTER — Encounter (HOSPITAL_COMMUNITY): Admission: EM | Disposition: A | Discharge status: Home / Self Care | Payer: Self-pay | Source: Home / Self Care

## 2022-12-28 ENCOUNTER — Inpatient Hospital Stay (HOSPITAL_COMMUNITY): Payer: Medicare Other

## 2022-12-28 ENCOUNTER — Inpatient Hospital Stay (HOSPITAL_COMMUNITY): Payer: Medicare Other | Admitting: Critical Care Medicine

## 2022-12-28 ENCOUNTER — Encounter (HOSPITAL_COMMUNITY): Payer: Self-pay | Admitting: Surgery

## 2022-12-28 DIAGNOSIS — E119 Type 2 diabetes mellitus without complications: Secondary | ICD-10-CM

## 2022-12-28 DIAGNOSIS — I10 Essential (primary) hypertension: Secondary | ICD-10-CM

## 2022-12-28 DIAGNOSIS — R932 Abnormal findings on diagnostic imaging of liver and biliary tract: Secondary | ICD-10-CM

## 2022-12-28 DIAGNOSIS — Z7984 Long term (current) use of oral hypoglycemic drugs: Secondary | ICD-10-CM

## 2022-12-28 DIAGNOSIS — K839 Disease of biliary tract, unspecified: Secondary | ICD-10-CM

## 2022-12-28 DIAGNOSIS — K9189 Other postprocedural complications and disorders of digestive system: Secondary | ICD-10-CM | POA: Diagnosis not present

## 2022-12-28 DIAGNOSIS — Z9049 Acquired absence of other specified parts of digestive tract: Secondary | ICD-10-CM | POA: Diagnosis not present

## 2022-12-28 DIAGNOSIS — Z794 Long term (current) use of insulin: Secondary | ICD-10-CM

## 2022-12-28 DIAGNOSIS — K838 Other specified diseases of biliary tract: Secondary | ICD-10-CM

## 2022-12-28 HISTORY — PX: ENDOSCOPIC RETROGRADE CHOLANGIOPANCREATOGRAPHY (ERCP) WITH PROPOFOL: SHX5810

## 2022-12-28 HISTORY — PX: BILIARY STENT PLACEMENT: SHX5538

## 2022-12-28 LAB — COMPREHENSIVE METABOLIC PANEL
ALT: 24 U/L (ref 0–44)
AST: 20 U/L (ref 15–41)
Albumin: 2.3 g/dL — ABNORMAL LOW (ref 3.5–5.0)
Alkaline Phosphatase: 60 U/L (ref 38–126)
Anion gap: 7 (ref 5–15)
BUN: 13 mg/dL (ref 8–23)
CO2: 26 mmol/L (ref 22–32)
Calcium: 8.8 mg/dL — ABNORMAL LOW (ref 8.9–10.3)
Chloride: 94 mmol/L — ABNORMAL LOW (ref 98–111)
Creatinine, Ser: 0.77 mg/dL (ref 0.44–1.00)
GFR, Estimated: >60 mL/min (ref >60)
Glucose, Bld: 128 mg/dL — ABNORMAL HIGH (ref 70–99)
Potassium: 3.7 mmol/L (ref 3.5–5.1)
Sodium: 127 mmol/L — ABNORMAL LOW (ref 135–145)
Total Bilirubin: 1.2 mg/dL (ref 0.3–1.2)
Total Protein: 5.1 g/dL — ABNORMAL LOW (ref 6.5–8.1)

## 2022-12-28 LAB — GLUCOSE, CAPILLARY
Glucose-Capillary: 111 mg/dL — ABNORMAL HIGH (ref 70–99)
Glucose-Capillary: 133 mg/dL — ABNORMAL HIGH (ref 70–99)
Glucose-Capillary: 141 mg/dL — ABNORMAL HIGH (ref 70–99)
Glucose-Capillary: 214 mg/dL — ABNORMAL HIGH (ref 70–99)
Glucose-Capillary: 79 mg/dL (ref 70–99)
Glucose-Capillary: 98 mg/dL (ref 70–99)

## 2022-12-28 LAB — CBC
HCT: 33.1 % — ABNORMAL LOW (ref 36.0–46.0)
Hemoglobin: 10.9 g/dL — ABNORMAL LOW (ref 12.0–15.0)
MCH: 30.6 pg (ref 26.0–34.0)
MCHC: 32.9 g/dL (ref 30.0–36.0)
MCV: 93 fL (ref 80.0–100.0)
Platelets: 182 10*3/uL (ref 150–400)
RBC: 3.56 MIL/uL — ABNORMAL LOW (ref 3.87–5.11)
RDW: 17.1 % — ABNORMAL HIGH (ref 11.5–15.5)
WBC: 21.7 10*3/uL — ABNORMAL HIGH (ref 4.0–10.5)
nRBC: 0 % (ref 0.0–0.2)

## 2022-12-28 LAB — AEROBIC/ANAEROBIC CULTURE W GRAM STAIN (SURGICAL/DEEP WOUND)

## 2022-12-28 SURGERY — ENDOSCOPIC RETROGRADE CHOLANGIOPANCREATOGRAPHY (ERCP) WITH PROPOFOL
Anesthesia: General

## 2022-12-28 MED ORDER — DICLOFENAC SUPPOSITORY 100 MG
RECTAL | Status: AC
  Filled 2022-12-28: qty 1

## 2022-12-28 MED ORDER — LACTATED RINGERS IV SOLN: INTRAVENOUS | Status: DC | PRN

## 2022-12-28 MED ORDER — METHOCARBAMOL 1000 MG/10ML IJ SOLN: 500.0000 mg | Freq: Four times a day (QID) | INTRAVENOUS | Status: DC | PRN

## 2022-12-28 MED ORDER — GLUCAGON HCL RDNA (DIAGNOSTIC) 1 MG IJ SOLR
INTRAMUSCULAR | Status: AC
  Filled 2022-12-28: qty 1

## 2022-12-28 MED ORDER — PHENYLEPHRINE HCL-NACL 20-0.9 MG/250ML-% IV SOLN
INTRAVENOUS | Status: DC | PRN
  Administered 2022-12-28: 25 ug/min via INTRAVENOUS

## 2022-12-28 MED ORDER — SODIUM CHLORIDE 0.9 % IV SOLN
INTRAVENOUS | Status: DC | PRN
  Administered 2022-12-28: 20 mL

## 2022-12-28 MED ORDER — ONDANSETRON HCL 4 MG/2ML IJ SOLN
INTRAMUSCULAR | Status: DC | PRN
  Administered 2022-12-28: 4 mg via INTRAVENOUS

## 2022-12-28 MED ORDER — HYDROMORPHONE HCL 1 MG/ML IJ SOLN
0.5000 mg | INTRAMUSCULAR | Status: AC | PRN
  Administered 2022-12-29: 0.5 mg via INTRAVENOUS
  Administered 2022-12-29: 1 mg via INTRAVENOUS
  Filled 2022-12-28: qty 1
  Filled 2022-12-28: qty 0.5

## 2022-12-28 MED ORDER — ACETAMINOPHEN 500 MG PO TABS
1000.0000 mg | ORAL_TABLET | Freq: Four times a day (QID) | ORAL | Status: DC
  Administered 2022-12-28 – 2022-12-30 (×7): 1000 mg via ORAL
  Filled 2022-12-28 (×8): qty 2

## 2022-12-28 MED ORDER — SUGAMMADEX SODIUM 200 MG/2ML IV SOLN
INTRAVENOUS | Status: DC | PRN
  Administered 2022-12-28: 200 mg via INTRAVENOUS

## 2022-12-28 MED ORDER — ROCURONIUM BROMIDE 10 MG/ML (PF) SYRINGE
PREFILLED_SYRINGE | INTRAVENOUS | Status: DC | PRN
  Administered 2022-12-28: 60 mg via INTRAVENOUS

## 2022-12-28 MED ORDER — DICLOFENAC SUPPOSITORY 100 MG
RECTAL | Status: DC | PRN
  Administered 2022-12-28: 100 mg via RECTAL

## 2022-12-28 MED ORDER — SODIUM CHLORIDE 1 G PO TABS
1.0000 g | ORAL_TABLET | Freq: Three times a day (TID) | ORAL | Status: DC
  Administered 2022-12-28 – 2022-12-30 (×6): 1 g via ORAL
  Filled 2022-12-28 (×9): qty 1

## 2022-12-28 MED ORDER — ACETAMINOPHEN 10 MG/ML IV SOLN
1000.0000 mg | Freq: Four times a day (QID) | INTRAVENOUS | Status: DC
  Filled 2022-12-28: qty 100

## 2022-12-28 MED ORDER — LIDOCAINE 2% (20 MG/ML) 5 ML SYRINGE
INTRAMUSCULAR | Status: DC | PRN
  Administered 2022-12-28: 100 mg via INTRAVENOUS

## 2022-12-28 MED ORDER — PROPOFOL 10 MG/ML IV BOLUS
INTRAVENOUS | Status: DC | PRN
  Administered 2022-12-28: 140 mg via INTRAVENOUS

## 2022-12-28 MED ORDER — DEXAMETHASONE SODIUM PHOSPHATE 10 MG/ML IJ SOLN
INTRAMUSCULAR | Status: DC | PRN
  Administered 2022-12-28: 4 mg via INTRAVENOUS

## 2022-12-28 MED ORDER — PHENYLEPHRINE 80 MCG/ML (10ML) SYRINGE FOR IV PUSH (FOR BLOOD PRESSURE SUPPORT)
PREFILLED_SYRINGE | INTRAVENOUS | Status: DC | PRN
  Administered 2022-12-28 (×4): 80 ug via INTRAVENOUS

## 2022-12-28 NOTE — Progress Notes (Signed)
Subjective/Chief Complaint: Just walked to the bathroom and back to the bed. Reports right sided abdominal pain, worse with movement and deep inspiration. Feels the pain is about the same a yesterday. She denies emesis. Reports flatus but denies BM.   Objective: Vital signs in last 24 hours: Temp:  [98.1 F (36.7 C)-98.6 F (37 C)] 98.1 F (36.7 C) (05/20 0726) Pulse Rate:  [80-88] 83 (05/20 0726) Resp:  [14-20] 16 (05/20 0726) BP: (125-162)/(58-86) 162/67 (05/20 0726) SpO2:  [81 %-100 %] 98 % (05/20 0726) Last BM Date : 12/25/22  Intake/Output from previous day: 05/19 0701 - 05/20 0700 In: 757.4 [I.V.:397.4; IV Piggyback:350] Out: 2595 [Urine:1400; Drains:1195] Intake/Output this shift: Total I/O In: 371.9 [I.V.:360.6; IV Piggyback:11.4] Out: -   General nad Cv regular Pulm - slightly labored respirations on nasal cannula, after mobility.  Ab tender ruq/rlq/epigastrium, palpation of left hemiabdomen elicits pain over RUQ.  nondistended some ecchymosis around incisions Drains with bilious effluent.   Lab Results:  Recent Labs    12/27/22 0228 12/28/22 0124  WBC 32.4* 21.7*  HGB 11.6* 10.9*  HCT 35.5* 33.1*  PLT 221 182   BMET Recent Labs    12/27/22 0228 12/28/22 0124  NA 129* 127*  K 4.2 3.7  CL 96* 94*  CO2 24 26  GLUCOSE 179* 128*  BUN 13 13  CREATININE 0.76 0.77  CALCIUM 9.0 8.8*   PT/INR Recent Labs    12/27/22 0742  LABPROT 17.7*  INR 1.4*   ABG No results for input(s): "PHART", "HCO3" in the last 72 hours.  Invalid input(s): "PCO2", "PO2"  Studies/Results: CT GUIDED VISCERAL FLUID DRAIN BY PERC CATH  Result Date: 12/28/2022 INDICATION: 77 year old female status post laparoscopic cholecystectomy complicated postoperatively by biloma presenting with sepsis. EXAM: CT IMAGE GUIDED DRAINAGE BY PERCUTANEOUS CATHETER x2 COMPARISON:  12/26/2022 MEDICATIONS: The patient is currently admitted to the hospital and receiving intravenous  antibiotics. The antibiotics were administered within an appropriate time frame prior to the initiation of the procedure. ANESTHESIA/SEDATION: Moderate (conscious) sedation was employed during this procedure. A total of Versed 1.5 mg and Fentanyl 75 mcg was administered intravenously. Moderate Sedation Time: 16 minutes. The patient's level of consciousness and vital signs were monitored continuously by radiology nursing throughout the procedure under my direct supervision. CONTRAST:  None COMPLICATIONS: None immediate. PROCEDURE: RADIATION DOSE REDUCTION: This exam was performed according to the departmental dose-optimization program which includes automated exposure control, adjustment of the mA and/or kV according to patient size and/or use of iterative reconstruction technique. Informed written consent was obtained from the patient after a discussion of the risks, benefits and alternatives to treatment. The patient was placed supine on the CT gantry and a pre procedural CT was performed re-demonstrating the known abscess/fluid collection within the gallbladder fossa and right perihepatic region. The procedure was planned. A timeout was performed prior to the initiation of the procedure. The right upper quadrant was prepped and draped in the usual sterile fashion. The overlying soft tissues were anesthetized with 1% lidocaine with epinephrine. Appropriate trajectory was planned with the use of a 22 gauge spinal needle. An 18 gauge trocar needle was advanced into the abscess/fluid collection and a short Amplatz super stiff wire was coiled within the collection. Appropriate positioning was confirmed with a limited CT scan. The tract was serially dilated allowing placement of a 8 [place] skater drainage catheter. Appropriate positioning was confirmed with a limited postprocedural CT scan. Approximately 30 ml of bilious fluid was aspirated. The tube  was connected to a bulb suction and sutured in place. Given inadequate  drainage of perihepatic fluid collection after gallbladder fossa drain placement, additional drain placement was warranted. Just superior to the gallbladder fossa drain, the overlying soft tissues were anesthetized with 1% lidocaine with epinephrine. Appropriate trajectory was planned with the use of a 22 gauge spinal needle. An 18 gauge trocar needle was advanced into the abscess/fluid collection and a short Amplatz super stiff wire was coiled within the collection. Appropriate positioning was confirmed with a limited CT scan. The tract was serially dilated allowing placement of a 10 [place] multipurpose drainage catheter. Appropriate positioning was confirmed with a limited postprocedural CT scan. Approximately 1,000 ml of bilious fluid was aspirated. The tube was connected to a bag drainage and sutured in place. A fluid sample was sent to evaluate for culture as well as fluid bilirubin. A dressing was placed. The patient tolerated the procedure well without immediate post procedural complication. IMPRESSION: 1. Successful CT guided placement of a 8 [place] all purpose drain catheter into the gallbladder fossa with aspiration of 30 mL of bilious fluid. Samples were sent to the laboratory as requested by the ordering clinical team. 2. Successful CT-guided placement of a 10 French all-purpose drain catheter into the perihepatic fluid collection with aspiration of approximately 1 L of bilious fluid. Marliss Coots, MD [facility] Electronically Signed   By: Marliss Coots M.D.   On: 12/28/2022 08:19   CT GUIDED VISCERAL FLUID DRAIN BY PERC CATH  Result Date: 12/28/2022 INDICATION: 77 year old female status post laparoscopic cholecystectomy complicated postoperatively by biloma presenting with sepsis. EXAM: CT IMAGE GUIDED DRAINAGE BY PERCUTANEOUS CATHETER x2 COMPARISON:  12/26/2022 MEDICATIONS: The patient is currently admitted to the hospital and receiving  intravenous antibiotics. The antibiotics were administered within an appropriate time frame prior to the initiation of the procedure. ANESTHESIA/SEDATION: Moderate (conscious) sedation was employed during this procedure. A total of Versed 1.5 mg and Fentanyl 75 mcg was administered intravenously. Moderate Sedation Time: 16 minutes. The patient's level of consciousness and vital signs were monitored continuously by radiology nursing throughout the procedure under my direct supervision. CONTRAST:  None COMPLICATIONS: None immediate. PROCEDURE: RADIATION DOSE REDUCTION: This exam was performed according to the departmental dose-optimization program which includes automated exposure control, adjustment of the mA and/or kV according to patient size and/or use of iterative reconstruction technique. Informed written consent was obtained from the patient after a discussion of the risks, benefits and alternatives to treatment. The patient was placed supine on the CT gantry and a pre procedural CT was performed re-demonstrating the known abscess/fluid collection within the gallbladder fossa and right perihepatic region. The procedure was planned. A timeout was performed prior to the initiation of the procedure. The right upper quadrant was prepped and draped in the usual sterile fashion. The overlying soft tissues were anesthetized with 1% lidocaine with epinephrine. Appropriate trajectory was planned with the use of a 22 gauge spinal needle. An 18 gauge trocar needle was advanced into the abscess/fluid collection and a short Amplatz super stiff wire was coiled within the collection. Appropriate positioning was confirmed with a limited CT scan. The tract was serially dilated allowing placement of a 8 [place] skater drainage catheter. Appropriate positioning was confirmed with a limited postprocedural CT scan. Approximately 30 ml of bilious fluid was aspirated. The tube was connected to a bulb suction and sutured in place.  Given inadequate drainage of perihepatic fluid collection after gallbladder fossa drain placement,  additional drain placement was warranted. Just superior to the gallbladder fossa drain, the overlying soft tissues were anesthetized with 1% lidocaine with epinephrine. Appropriate trajectory was planned with the use of a 22 gauge spinal needle. An 18 gauge trocar needle was advanced into the abscess/fluid collection and a short Amplatz super stiff wire was coiled within the collection. Appropriate positioning was confirmed with a limited CT scan. The tract was serially dilated allowing placement of a 10 [place] multipurpose drainage catheter. Appropriate positioning was confirmed with a limited postprocedural CT scan. Approximately 1,000 ml of bilious fluid was aspirated. The tube was connected to a bag drainage and sutured in place. A fluid sample was sent to evaluate for culture as well as fluid bilirubin. A dressing was placed. The patient tolerated the procedure well without immediate post procedural complication. IMPRESSION: 1. Successful CT guided placement of a 8 [place] all purpose drain catheter into the gallbladder fossa with aspiration of 30 mL of bilious fluid. Samples were sent to the laboratory as requested by the ordering clinical team. 2. Successful CT-guided placement of a 10 French all-purpose drain catheter into the perihepatic fluid collection with aspiration of approximately 1 L of bilious fluid. Marliss Coots, MD [facility] Electronically Signed   By: Marliss Coots M.D.   On: 12/28/2022 08:19   MR LIVER W WO CONTRAST  Result Date: 12/27/2022 CLINICAL DATA:  77 year old female status post cholecystectomy with abdominal pain. Evaluate for potential bile leak. EXAM: MRI ABDOMEN WITHOUT AND WITH CONTRAST TECHNIQUE: Multiplanar multisequence MR imaging of the abdomen was performed both before and after the administration of intravenous  contrast. CONTRAST:  9mL EOVIST GADOXETATE DISODIUM 0.25 MOL/L IV SOLN COMPARISON:  No prior abdominal MRI. CT of the abdomen and pelvis 12/26/2022. FINDINGS: Lower chest: Small right pleural effusion lying dependently with areas of passive atelectasis in the lower lobes of the lungs bilaterally. Hepatobiliary: Liver has a shrunken appearance and nodular contour, indicative of underlying cirrhosis. No suspicious cystic or solid hepatic lesions. No intra or extrahepatic biliary ductal dilatation. Patient is status post cholecystectomy. Small amount of fluid in the gallbladder fossa is T1 hypointense and heterogeneously T2 hyperintense. On 20 minutes delayed post gadolinium images with Eovist there is a small amount of excreted contrast material which extends into the lateral aspect of this collection (best appreciated on axial image 55 of series 22). In addition, more abundant excretion of contrast is noted into a perihepatic fluid collection anterolaterally to the liver best appreciated on axial image 52 of series 22, indicative of postoperative bile leak with biloma in the gallbladder fossa and perihepatic region. This perihepatic fluid collection is T1 hypointense, T2 hyperintense and contains excreted contrast material and 20 minute post gadolinium delayed images, surrounding much of the central aspect of the liver adjacent to portions of segments 4 A, 4B, 5 and 8. Common bile duct measures 4 mm in the porta hepatis. No filling defect in the common bile duct to suggest choledocholithiasis. Pancreas: No pancreatic mass. No pancreatic ductal dilatation. No peripancreatic fluid collections or inflammatory changes. Spleen:  Unremarkable. Adrenals/Urinary Tract: Bilateral kidneys and adrenal glands are normal in appearance. No hydroureteronephrosis. Stomach/Bowel: Visualized portions are unremarkable. Vascular/Lymphatic: No aneurysm identified in the visualized abdominal vasculature. No lymphadenopathy noted in the  abdomen. Other:  Trace volume of perihepatic ascites. Musculoskeletal: No aggressive appearing osseous lesions are noted in the visualized portions of the skeleton. IMPRESSION: 1. Study is positive for postoperative bile leak with bilomas noted  in the gallbladder fossa and overlying the central aspect of the liver, as detailed above. 2. Trace volume of perihepatic ascites. 3. Morphologic changes in the liver suggestive of cirrhosis. 4. Small right pleural effusion lying dependently. Passive areas of subsegmental atelectasis in the lower lobes of the lungs bilaterally. These results will be called to the ordering clinician or representative by the Radiologist Assistant, and communication documented in the PACS or [employer]. Electronically Signed   By: Trudie Reed M.D.   On: 12/27/2022 07:56   CT ABDOMEN PELVIS W CONTRAST  Result Date: 12/26/2022 CLINICAL DATA:  77 year old female with abdominal pain. Postoperative day 3 status post cholecystectomy. EXAM: CT ABDOMEN AND PELVIS WITH CONTRAST TECHNIQUE: Multidetector CT imaging of the abdomen and pelvis was performed using the standard protocol following bolus administration of intravenous contrast. RADIATION DOSE REDUCTION: This exam was performed according to the departmental dose-optimization program which includes automated exposure control, adjustment of the mA and/or kV according to patient size and/or use of iterative reconstruction technique. CONTRAST:  75mL OMNIPAQUE IOHEXOL 350 MG/ML SOLN COMPARISON:  CT Abdomen and Pelvis 10/21/2022 and earlier. FINDINGS: Lower chest: Small layering pleural effusions are new. No pericardial effusion. Mild dependent lung base opacity most resembles atelectasis. Hepatobiliary: Nodular liver contour with small volume new perihepatic ascites (series 3, image 23), simple fluid density. However, gallbladder also surgically absent now. Surgical clips and expected postoperative changes in the gallbladder fossa. No  discrete liver lesion. Pancreas: Atrophied. Spleen: Stable spleen at the upper limits of normal. Adrenals/Urinary Tract: Normal adrenal glands. Nonobstructed kidneys with symmetric renal enhancement and contrast excretion. Distended but otherwise unremarkable bladder. Stomach/Bowel: No dilated large or small bowel loops. Retained stool in the colon. Diverticulosis of the descending and sigmoid colon. Redundant right colon. Cecum on a lax mesentery in the anterior abdomen. Normal appendix arising on series 3, image 66. No abnormal small bowel. Small gastric hiatal hernia. Small volume fluid in the stomach. Decompressed duodenum. No free air. Vascular/Lymphatic: Aortoiliac calcified atherosclerosis. Major arterial structures are patent. Normal caliber abdominal aorta. Portal venous system is patent. No lymphadenopathy identified. Reproductive: Negative. Other: Trace new pelvis free fluid. Mild ventral abdominal wall postoperative changes with no adverse features. Musculoskeletal: Right hip arthroplasty. Advanced degeneration in the spine. Chronic T12 compression fracture. Stable visualized osseous structures. IMPRESSION: 1. Recent cholecystectomy with largely expected postoperative changes. A small volume of Ascites is new, nonspecific, but with underlying nodular liver contour raising the possibility of Cirrhosis. 2. Small layering pleural effusions are new with lung base atelectasis. 3. No other acute or inflammatory process identified in the abdomen or pelvis. Aortic Atherosclerosis (ICD10-I70.0). Electronically Signed   By: Odessa Fleming M.D.   On: 12/26/2022 12:34    Anti-infectives: Anti-infectives (From admission, onward)    Start     Dose/Rate Route Frequency Ordered Stop   12/27/22 0915  piperacillin-tazobactam (ZOSYN) IVPB 3.375 g        3.375 g 12.5 mL/hr over 240 Minutes Intravenous Every 8 hours 12/27/22 0815         Assessment/Plan: S/p lap chole 5/15 Dr. Gaynelle Adu Bile leak - afebrile WBC 21  from 31 - s/p IR biloma drains 5/19 - GI planning ERCP/stent today   FEN: NPO, IVF at 100 mL/hr, start salt tabs TID; hyponatremia (127 today) VTE: Hold pharm dvt prophlaxis for procedure today Pain: IV tylenol, add IV robaxin PRN for pain, 5 mg oxycodone q4h PRN, dilaudid for breakthrough   DM-SSI HTN-home meds    [name] Manis  S Makiya [name] 12/28/2022

## 2022-12-28 NOTE — Anesthesia Postprocedure Evaluation (Signed)
Anesthesia Post Note  Patient: MAIS MARBAN  Procedure(s) Performed: ENDOSCOPIC RETROGRADE CHOLANGIOPANCREATOGRAPHY (ERCP) WITH PROPOFOL BILIARY STENT PLACEMENT     Patient location during evaluation: PACU Anesthesia Type: General Level of consciousness: awake and alert Pain management: pain level controlled Vital Signs Assessment: post-procedure vital signs reviewed and stable Respiratory status: spontaneous breathing, nonlabored ventilation and respiratory function stable Cardiovascular status: blood pressure returned to baseline Postop Assessment: no apparent nausea or vomiting Anesthetic complications: no   No notable events documented.          Shanda Howells

## 2022-12-28 NOTE — Anesthesia Preprocedure Evaluation (Addendum)
Anesthesia Evaluation  Patient identified by MRN, date of birth, ID band Patient awake    Reviewed: Allergy & Precautions, NPO status , Patient's Chart, lab work & pertinent test results  History of Anesthesia Complications Negative for: history of anesthetic complications  Airway Mallampati: II  TM Distance: >3 FB Neck ROM: Full    Dental no notable dental hx.    Pulmonary sleep apnea    Pulmonary exam normal        Cardiovascular hypertension, Pt. on medications Normal cardiovascular exam     Neuro/Psych   Anxiety Depression       GI/Hepatic Neg liver ROS,GERD  Medicated,,Bile leak   Endo/Other  diabetes, Type 2, Oral Hypoglycemic Agents, Insulin Dependent    Renal/GU negative Renal ROS     Musculoskeletal  (+) Arthritis ,    Abdominal   Peds  Hematology negative hematology ROS (+)   Anesthesia Other Findings Day of surgery medications reviewed with patient.  Reproductive/Obstetrics                             Anesthesia Physical Anesthesia Plan  ASA: 3  Anesthesia Plan: General   Post-op Pain Management: Minimal or no pain anticipated   Induction: Intravenous  PONV Risk Score and Plan: 3 and Ondansetron, Dexamethasone and Treatment may vary due to age or medical condition  Airway Management Planned: Oral ETT  Additional Equipment: None  Intra-op Plan:   Post-operative Plan: Extubation in OR  Informed Consent: I have reviewed the patients History and Physical, chart, labs and discussed the procedure including the risks, benefits and alternatives for the proposed anesthesia with the patient or authorized representative who has indicated his/her understanding and acceptance.     Dental advisory given  Plan Discussed with: CRNA  Anesthesia Plan Comments:        Anesthesia Quick Evaluation

## 2022-12-28 NOTE — Anesthesia Procedure Notes (Signed)
Procedure Name: Intubation Date/Time: 12/28/2022 1:15 PM  Performed by: Rachel Moulds, CRNAPre-anesthesia Checklist: Patient identified, Emergency Drugs available, Suction available, Patient being monitored and Timeout performed Patient Re-evaluated:Patient Re-evaluated prior to induction Oxygen Delivery Method: Circle system utilized Preoxygenation: Pre-oxygenation with 100% oxygen Induction Type: IV induction Ventilation: Mask ventilation without difficulty Laryngoscope Size: Mac and 3 Grade View: Grade III Tube type: Oral Tube size: 7.0 mm Number of attempts: 1 Airway Equipment and Method: Stylet Placement Confirmation: ETT inserted through vocal cords under direct vision, positive ETCO2, CO2 detector and breath sounds checked- equal and bilateral Secured at: 21 cm Tube secured with: Tape Dental Injury: Teeth and Oropharynx as per pre-operative assessment

## 2022-12-28 NOTE — Op Note (Addendum)
[facility] Patient Name: [name] [name] Procedure Date : 12/28/2022 MRN: [id number] Attending MD: Iva Boop , MD, [id number] Date of Birth: [birth year] CSN: [id number] Age: 77 Admit Type: Inpatient Procedure:                ERCP Indications:              Bile leak, Treatment of bile leak Providers:                Iva Boop, MD, Fransisca Connors, Marja Kays, Technician Referring MD:              Medicines:                General Anesthesia, On intermittent continuous                            Zosyn; diclofenac 100 mg per rectum given Complications:            No immediate complications. Estimated Blood Loss:     Estimated blood loss: none. Procedure:                Pre-Anesthesia Assessment:                           - Prior to the procedure, a History and Physical                            was performed, and patient medications and                            allergies were reviewed. The patient's tolerance of                            previous anesthesia was also reviewed. The risks                            and benefits of the procedure and the sedation                            options and risks were discussed with the patient.                            All questions were answered, and informed consent                            was obtained. Prior Anticoagulants: The patient                            last took Lovenox (enoxaparin) 2 days prior to the                            procedure. ASA Grade Assessment: III - A patient  with severe systemic disease. After reviewing the                            risks and benefits, the patient was deemed in                            satisfactory condition to undergo the procedure.                           After obtaining informed consent, the scope was                            passed under direct vision. Throughout the                             procedure, the patient's blood pressure, pulse, and                            oxygen saturations were monitored continuously. The                            [employer] single use                            duodenoscope was introduced through the mouth, and                            used to inject contrast into and used to inject                            contrast into the bile duct. The ERCP was                            accomplished without difficulty. The patient                            tolerated the procedure well. Scope In: Scope Out: Findings:      A scout film of the abdomen was obtained. Percutaneous drain(s) and       surgical clips consistent with a previous cholecystectomy were seen. The       esophagus was not seen well, limited gastric views normal.      Main papilla and duodenum normal. Wire-guided cannulation using       sphincterotome accmplished on first attempt, contrast injected revealing       normal caliber biliary tree. There was faint opacification in the       subhepatic area where drains are consistent w/ bile leak. There were       several very round mobile filling defects in CBD which I believe were       air bubbles. Some of them disappeared. A 5 cm 10 Fr plastic biliary       stent was placed w/ prompt drainage of contrast. Procedure terminated,       no pancreatic duct entry. Impression:               -  A bile leak was found. This was treated with                            stent placement.                           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 No leak                            from cystic duct stump.                           - Some very round mobile filling defects seen -                            some disappeared, I think these were air bubbles. Recommendation:           - Clear liquids today and if ok tomorrow adbvance                             diet                           Watch drain output - should decrease after stenting                           Will need outpatient ERCP after dc for stent                            removal and repeat cholangiogram given filling                            defects seen today (suspect they were air bubbles)                            - I will arrange for about 2 months from now.                           Daughter Tressia Danas, MD updated via phone Procedure Code(s):        --- Professional ---                           [phone], Endoscopic retrograde                            cholangiopancreatography (ERCP); with placement of                            endoscopic stent into biliary or pancreatic duct,  including pre- and post-dilation and guide wire                            passage, when performed, including sphincterotomy,                            when performed, each stent Diagnosis Code(s):        --- Professional ---                           K83.9, Disease of biliary tract, unspecified                           K83.8, Other specified diseases of biliary tract CPT copyright 2022 American [employer]. All rights reserved. The codes documented in this report are preliminary and upon coder review may  be revised to meet current compliance requirements. Iva Boop, MD 12/28/2022 2:05:01 PM This report has been signed electronically. Number of Addenda: 0

## 2022-12-28 NOTE — Transfer of Care (Signed)
Immediate Anesthesia Transfer of Care Note  Patient: MARELLY RESKE  Procedure(s) Performed: ENDOSCOPIC RETROGRADE CHOLANGIOPANCREATOGRAPHY (ERCP) WITH PROPOFOL BILIARY STENT PLACEMENT  Patient Location: Endoscopy Unit  Anesthesia Type:General  Level of Consciousness: awake, alert , and oriented  Airway & Oxygen Therapy: Patient Spontanous Breathing and Patient connected to nasal cannula oxygen  Post-op Assessment: Report given to RN and Post -op Vital signs reviewed and stable  Post vital signs: Reviewed and stable  Last Vitals:  Vitals Value Taken Time  BP 144/70   Temp    Pulse 88 12/28/22 1408  Resp 15 12/28/22 1408  SpO2 94 % 12/28/22 1408  Vitals shown include unvalidated device data.  Last Pain:  Vitals:   12/28/22 1209  TempSrc: Temporal  PainSc: 7          Complications: No notable events documented.

## 2022-12-28 NOTE — Progress Notes (Signed)
Progress Note   Subjective  Patient having some ongoing abdominal pain. 1 L drained per IR yesterday which helped. Scheduled for ERCP today.   Objective   Vital signs in last 24 hours: Temp:  [98.1 F (36.7 C)-98.6 F (37 C)] 98.1 F (36.7 C) (05/20 0726) Pulse Rate:  [80-88] 83 (05/20 0726) Resp:  [14-20] 16 (05/20 0726) BP: (125-162)/(58-86) 162/67 (05/20 0726) SpO2:  [81 %-100 %] 98 % (05/20 0726) Last BM Date : 12/25/22 General:    white female in NAD Abdomen:  Soft, tenderness in most areas, worst in the RUQ. Drain in place. Neurologic:  Alert and oriented,  grossly normal neurologically. Psych:  Cooperative. Normal mood and affect.  Intake/Output from previous day: 05/19 0701 - 05/20 0700 In: 757.4 [I.V.:397.4; IV Piggyback:350] Out: 2595 [Urine:1400; Drains:1195] Intake/Output this shift: Total I/O In: 371.9 [I.V.:360.6; IV Piggyback:11.4] Out: -   Lab Results: Recent Labs    12/26/22 0837 12/27/22 0228 12/28/22 0124  WBC 17.3* 32.4* 21.7*  HGB 11.3* 11.6* 10.9*  HCT 35.5* 35.5* 33.1*  PLT 223 221 182   BMET Recent Labs    12/26/22 0837 12/27/22 0228 12/28/22 0124  NA 132* 129* 127*  K 4.2 4.2 3.7  CL 99 96* 94*  CO2 23 24 26   GLUCOSE 116* 179* 128*  BUN 16 13 13   CREATININE 0.96 0.76 0.77  CALCIUM 9.5 9.0 8.8*   LFT Recent Labs    12/28/22 0124  PROT 5.1*  ALBUMIN 2.3*  AST 20  ALT 24  ALKPHOS 60  BILITOT 1.2   PT/INR Recent Labs    12/27/22 0742  LABPROT 17.7*  INR 1.4*    Studies/Results: CT GUIDED VISCERAL FLUID DRAIN BY PERC CATH  Result Date: 12/28/2022 INDICATION: 77 year old female status post laparoscopic cholecystectomy complicated postoperatively by biloma presenting with sepsis. EXAM: CT IMAGE GUIDED DRAINAGE BY PERCUTANEOUS CATHETER x2 COMPARISON:  12/26/2022 MEDICATIONS: The patient is currently admitted to the hospital and receiving intravenous antibiotics. The antibiotics were administered within an  appropriate time frame prior to the initiation of the procedure. ANESTHESIA/SEDATION: Moderate (conscious) sedation was employed during this procedure. A total of Versed 1.5 mg and Fentanyl 75 mcg was administered intravenously. Moderate Sedation Time: 16 minutes. The patient's level of consciousness and vital signs were monitored continuously by radiology nursing throughout the procedure under my direct supervision. CONTRAST:  None COMPLICATIONS: None immediate. PROCEDURE: RADIATION DOSE REDUCTION: This exam was performed according to the departmental dose-optimization program which includes automated exposure control, adjustment of the mA and/or kV according to patient size and/or use of iterative reconstruction technique. Informed written consent was obtained from the patient after a discussion of the risks, benefits and alternatives to treatment. The patient was placed supine on the CT gantry and a pre procedural CT was performed re-demonstrating the known abscess/fluid collection within the gallbladder fossa and right perihepatic region. The procedure was planned. A timeout was performed prior to the initiation of the procedure. The right upper quadrant was prepped and draped in the usual sterile fashion. The overlying soft tissues were anesthetized with 1% lidocaine with epinephrine. Appropriate trajectory was planned with the use of a 22 gauge spinal needle. An 18 gauge trocar needle was advanced into the abscess/fluid collection and a short Amplatz super stiff wire was coiled within the collection. Appropriate positioning was confirmed with a limited CT scan. The tract was serially dilated allowing placement of a 8 [place] skater drainage catheter. Appropriate positioning was confirmed with  a limited postprocedural CT scan. Approximately 30 ml of bilious fluid was aspirated. The tube was connected to a bulb suction and sutured in place. Given inadequate drainage of perihepatic fluid collection after  gallbladder fossa drain placement, additional drain placement was warranted. Just superior to the gallbladder fossa drain, the overlying soft tissues were anesthetized with 1% lidocaine with epinephrine. Appropriate trajectory was planned with the use of a 22 gauge spinal needle. An 18 gauge trocar needle was advanced into the abscess/fluid collection and a short Amplatz super stiff wire was coiled within the collection. Appropriate positioning was confirmed with a limited CT scan. The tract was serially dilated allowing placement of a 10 [place] multipurpose drainage catheter. Appropriate positioning was confirmed with a limited postprocedural CT scan. Approximately 1,000 ml of bilious fluid was aspirated. The tube was connected to a bag drainage and sutured in place. A fluid sample was sent to evaluate for culture as well as fluid bilirubin. A dressing was placed. The patient tolerated the procedure well without immediate post procedural complication. IMPRESSION: 1. Successful CT guided placement of a 8 [place] all purpose drain catheter into the gallbladder fossa with aspiration of 30 mL of bilious fluid. Samples were sent to the laboratory as requested by the ordering clinical team. 2. Successful CT-guided placement of a 10 French all-purpose drain catheter into the perihepatic fluid collection with aspiration of approximately 1 L of bilious fluid. Marliss Coots, MD [facility] Radiology Electronically Signed   By: Marliss Coots M.D.   On: 12/28/2022 08:19   CT GUIDED VISCERAL FLUID DRAIN BY PERC CATH  Result Date: 12/28/2022 INDICATION: 77 year old female status post laparoscopic cholecystectomy complicated postoperatively by biloma presenting with sepsis. EXAM: CT IMAGE GUIDED DRAINAGE BY PERCUTANEOUS CATHETER x2 COMPARISON:  12/26/2022 MEDICATIONS: The patient is currently admitted to the hospital and receiving intravenous antibiotics. The antibiotics were  administered within an appropriate time frame prior to the initiation of the procedure. ANESTHESIA/SEDATION: Moderate (conscious) sedation was employed during this procedure. A total of Versed 1.5 mg and Fentanyl 75 mcg was administered intravenously. Moderate Sedation Time: 16 minutes. The patient's level of consciousness and vital signs were monitored continuously by radiology nursing throughout the procedure under my direct supervision. CONTRAST:  None COMPLICATIONS: None immediate. PROCEDURE: RADIATION DOSE REDUCTION: This exam was performed according to the departmental dose-optimization program which includes automated exposure control, adjustment of the mA and/or kV according to patient size and/or use of iterative reconstruction technique. Informed written consent was obtained from the patient after a discussion of the risks, benefits and alternatives to treatment. The patient was placed supine on the CT gantry and a pre procedural CT was performed re-demonstrating the known abscess/fluid collection within the gallbladder fossa and right perihepatic region. The procedure was planned. A timeout was performed prior to the initiation of the procedure. The right upper quadrant was prepped and draped in the usual sterile fashion. The overlying soft tissues were anesthetized with 1% lidocaine with epinephrine. Appropriate trajectory was planned with the use of a 22 gauge spinal needle. An 18 gauge trocar needle was advanced into the abscess/fluid collection and a short Amplatz super stiff wire was coiled within the collection. Appropriate positioning was confirmed with a limited CT scan. The tract was serially dilated allowing placement of a 8 [place] skater drainage catheter. Appropriate positioning was confirmed with a limited postprocedural CT scan. Approximately 30 ml of bilious fluid was aspirated. The tube was connected to a bulb suction and sutured  in place. Given inadequate drainage of perihepatic fluid  collection after gallbladder fossa drain placement, additional drain placement was warranted. Just superior to the gallbladder fossa drain, the overlying soft tissues were anesthetized with 1% lidocaine with epinephrine. Appropriate trajectory was planned with the use of a 22 gauge spinal needle. An 18 gauge trocar needle was advanced into the abscess/fluid collection and a short Amplatz super stiff wire was coiled within the collection. Appropriate positioning was confirmed with a limited CT scan. The tract was serially dilated allowing placement of a 10 [place] multipurpose drainage catheter. Appropriate positioning was confirmed with a limited postprocedural CT scan. Approximately 1,000 ml of bilious fluid was aspirated. The tube was connected to a bag drainage and sutured in place. A fluid sample was sent to evaluate for culture as well as fluid bilirubin. A dressing was placed. The patient tolerated the procedure well without immediate post procedural complication. IMPRESSION: 1. Successful CT guided placement of a 8 [place] all purpose drain catheter into the gallbladder fossa with aspiration of 30 mL of bilious fluid. Samples were sent to the laboratory as requested by the ordering clinical team. 2. Successful CT-guided placement of a 10 French all-purpose drain catheter into the perihepatic fluid collection with aspiration of approximately 1 L of bilious fluid. Marliss Coots, MD [facility] Radiology Electronically Signed   By: Marliss Coots M.D.   On: 12/28/2022 08:19   MR LIVER W WO CONTRAST  Result Date: 12/27/2022 CLINICAL DATA:  77 year old female status post cholecystectomy with abdominal pain. Evaluate for potential bile leak. EXAM: MRI ABDOMEN WITHOUT AND WITH CONTRAST TECHNIQUE: Multiplanar multisequence MR imaging of the abdomen was performed both before and after the administration of intravenous contrast. CONTRAST:  9mL EOVIST GADOXETATE DISODIUM  0.25 MOL/L IV SOLN COMPARISON:  No prior abdominal MRI. CT of the abdomen and pelvis 12/26/2022. FINDINGS: Lower chest: Small right pleural effusion lying dependently with areas of passive atelectasis in the lower lobes of the lungs bilaterally. Hepatobiliary: Liver has a shrunken appearance and nodular contour, indicative of underlying cirrhosis. No suspicious cystic or solid hepatic lesions. No intra or extrahepatic biliary ductal dilatation. Patient is status post cholecystectomy. Small amount of fluid in the gallbladder fossa is T1 hypointense and heterogeneously T2 hyperintense. On 20 minutes delayed post gadolinium images with Eovist there is a small amount of excreted contrast material which extends into the lateral aspect of this collection (best appreciated on axial image 55 of series 22). In addition, more abundant excretion of contrast is noted into a perihepatic fluid collection anterolaterally to the liver best appreciated on axial image 52 of series 22, indicative of postoperative bile leak with biloma in the gallbladder fossa and perihepatic region. This perihepatic fluid collection is T1 hypointense, T2 hyperintense and contains excreted contrast material and 20 minute post gadolinium delayed images, surrounding much of the central aspect of the liver adjacent to portions of segments 4 A, 4B, 5 and 8. Common bile duct measures 4 mm in the porta hepatis. No filling defect in the common bile duct to suggest choledocholithiasis. Pancreas: No pancreatic mass. No pancreatic ductal dilatation. No peripancreatic fluid collections or inflammatory changes. Spleen:  Unremarkable. Adrenals/Urinary Tract: Bilateral kidneys and adrenal glands are normal in appearance. No hydroureteronephrosis. Stomach/Bowel: Visualized portions are unremarkable. Vascular/Lymphatic: No aneurysm identified in the visualized abdominal vasculature. No lymphadenopathy noted in the abdomen. Other:  Trace volume of perihepatic ascites.  Musculoskeletal: No aggressive appearing osseous lesions are noted in the visualized portions  of the skeleton. IMPRESSION: 1. Study is positive for postoperative bile leak with bilomas noted in the gallbladder fossa and overlying the central aspect of the liver, as detailed above. 2. Trace volume of perihepatic ascites. 3. Morphologic changes in the liver suggestive of cirrhosis. 4. Small right pleural effusion lying dependently. Passive areas of subsegmental atelectasis in the lower lobes of the lungs bilaterally. These results will be called to the ordering clinician or representative by the Radiologist Assistant, and communication documented in the PACS or [employer]. Electronically Signed   By: Trudie Reed M.D.   On: 12/27/2022 07:56   CT ABDOMEN PELVIS W CONTRAST  Result Date: 12/26/2022 CLINICAL DATA:  77 year old female with abdominal pain. Postoperative day 3 status post cholecystectomy. EXAM: CT ABDOMEN AND PELVIS WITH CONTRAST TECHNIQUE: Multidetector CT imaging of the abdomen and pelvis was performed using the standard protocol following bolus administration of intravenous contrast. RADIATION DOSE REDUCTION: This exam was performed according to the departmental dose-optimization program which includes automated exposure control, adjustment of the mA and/or kV according to patient size and/or use of iterative reconstruction technique. CONTRAST:  75mL OMNIPAQUE IOHEXOL 350 MG/ML SOLN COMPARISON:  CT Abdomen and Pelvis 10/21/2022 and earlier. FINDINGS: Lower chest: Small layering pleural effusions are new. No pericardial effusion. Mild dependent lung base opacity most resembles atelectasis. Hepatobiliary: Nodular liver contour with small volume new perihepatic ascites (series 3, image 23), simple fluid density. However, gallbladder also surgically absent now. Surgical clips and expected postoperative changes in the gallbladder fossa. No discrete liver lesion. Pancreas: Atrophied. Spleen:  Stable spleen at the upper limits of normal. Adrenals/Urinary Tract: Normal adrenal glands. Nonobstructed kidneys with symmetric renal enhancement and contrast excretion. Distended but otherwise unremarkable bladder. Stomach/Bowel: No dilated large or small bowel loops. Retained stool in the colon. Diverticulosis of the descending and sigmoid colon. Redundant right colon. Cecum on a lax mesentery in the anterior abdomen. Normal appendix arising on series 3, image 66. No abnormal small bowel. Small gastric hiatal hernia. Small volume fluid in the stomach. Decompressed duodenum. No free air. Vascular/Lymphatic: Aortoiliac calcified atherosclerosis. Major arterial structures are patent. Normal caliber abdominal aorta. Portal venous system is patent. No lymphadenopathy identified. Reproductive: Negative. Other: Trace new pelvis free fluid. Mild ventral abdominal wall postoperative changes with no adverse features. Musculoskeletal: Right hip arthroplasty. Advanced degeneration in the spine. Chronic T12 compression fracture. Stable visualized osseous structures. IMPRESSION: 1. Recent cholecystectomy with largely expected postoperative changes. A small volume of Ascites is new, nonspecific, but with underlying nodular liver contour raising the possibility of Cirrhosis. 2. Small layering pleural effusions are new with lung base atelectasis. 3. No other acute or inflammatory process identified in the abdomen or pelvis. Aortic Atherosclerosis (ICD10-I70.0). Electronically Signed   By: Odessa Fleming M.D.   On: 12/26/2022 12:34       Assessment / Plan:    77 y/o female here with the following:  Bile leak post cholecystectomy Abnormal imaging of the liver  Patient is with bile leak postcholecystectomy causing pain and leukocytosis.  Status post IR guided drainage yesterday with good output, 1 L removed.  Leukocytosis improved.  She is scheduled for an ERCP later today with Dr. Concha Se.  I explained to her what this  procedure is, risks of the procedure and anesthesia.  She understands this and wishes to proceed.  Continue antibiotics for now.  Her pain does not seem controlled well enough and will touch base with primary team about adjusting her pain medicine.  Incidentally noted  on MRI and CT scan since she has been in the hospital has been changes suggestive of cirrhosis.  This has not been seen on prior imaging studies and appears new.  I do see history of fatty liver in the past. Her liver enzymes are normal. She has also been on longstanding methotrexate.  I spoke with her daughter today, Dr. [name], and updated her on the findings.  The patient has had a liver biopsy more than 10 years ago which did not show any problems at the time but certainly could be at risk for fibrotic change with longstanding methotrexate.  She will need workup for this as outpatient.  If she does have cirrhosis, she has compensated liver disease.  Platelets are normal, spleen appears normal on imaging.  Her INR is 1.4 however her nutrition recently has been poor due to these other issues which could influence this result.  Further recommendations pending her course following ERCP.  Please call us with any questions in the interim.  N.p.o. until her ERCP  Harlin Rain, MD [facility] Gastroenterology

## 2022-12-28 NOTE — Progress Notes (Signed)
Referring Physician(s): Emelia Loron   Supervising Physician: Richarda Overlie  Patient Status:  [facility] - In-pt  Chief Complaint:   S/p lap chole on 5/15 presented with RUQ pain, found to have bile leak and biloma.  S/p aspiration and drain placement by Dr. Elby Showers on 12/27/22.   Subjective:  Patient laying in bed reading a book, significant other at bedside.  Reports persistent RUQ pain which has not improved much after the biloma drain placement. Denies n/v.  Scheduled for ERCP with stent placement today.   Allergies: Other, Thorazine [chlorpromazine], Bee venom, Farxiga [dapagliflozin], Semaglutide, and Sulfa antibiotics  Medications: Prior to Admission medications   Medication Sig Start Date End Date Taking? Authorizing Provider  carvedilol (COREG) 6.25 MG tablet Take 6.25 mg by mouth 2 (two) times daily. 12/26/22  Yes [provider]  cyclobenzaprine (FLEXERIL) 10 MG tablet TAKE 1 TABLET BY MOUTH AT BEDTIME Patient taking differently: Take 10 mg by mouth at bedtime. 09/07/22  Yes Burns, Bobette Mo, MD  insulin glargine (LANTUS SOLOSTAR) 100 UNIT/ML Solostar Pen Inject 20 Units into the skin 2 (two) times daily. 10/14/22  Yes Burns, Bobette Mo, MD  lisinopril (ZESTRIL) 20 MG tablet Take 1 tablet (20 mg total) by mouth daily. 10/20/21  Yes Lennette Bihari, MD  metFORMIN (GLUCOPHAGE-XR) 500 MG 24 hr tablet TAKE 1 TABLET BY MOUTH TWICE A DAY 07/24/22  Yes Burns, Bobette Mo, MD  methotrexate (RHEUMATREX) 2.5 MG tablet Take 20 mg by mouth every Sunday at 6pm. 06/24/16  Yes [provider]  omeprazole (PRILOSEC) 40 MG capsule Take 1 capsule (40 mg total) by mouth 2 (two) times daily. 11/11/22  Yes Imogene Burn, MD  oxyCODONE-acetaminophen (PERCOCET) 10-325 MG tablet Take 1 tablet by mouth every 8 (eight) hours as needed for pain. 09/14/22  Yes [provider]  potassium chloride (KLOR-CON M) 10 MEQ tablet Take 2 tablets (20 mEq total) by mouth daily. Patient taking  differently: Take 10 mEq by mouth 2 (two) times daily. 09/18/22  Yes Burns, Bobette Mo, MD  rosuvastatin (CRESTOR) 5 MG tablet TAKE 1 TABLET(5 MG) BY MOUTH DAILY Patient taking differently: Take 5 mg by mouth every evening. 03/19/22  Yes Burns, Bobette Mo, MD  sertraline (ZOLOFT) 100 MG tablet Take 1.5 tablets (150 mg total) by mouth daily. 09/21/22  Yes Pincus Sanes, MD     Vital Signs: BP (!) 162/67 (BP Location: Left Arm)   Pulse 83   Temp 98.1 F (36.7 C) (Oral)   Resp 16   Ht 5\' 6"  (1.676 m)   Wt 199 lb 1.2 oz (90.3 kg)   SpO2 98%   BMI 32.13 kg/m   Physical Exam Vitals reviewed.  Constitutional:      General: She is not in acute distress.    Appearance: She is not ill-appearing.  HENT:     Head: Normocephalic.  Pulmonary:     Effort: Pulmonary effort is normal.  Abdominal:     General: Abdomen is flat.     Palpations: Abdomen is soft.     Comments: Positive RUQ drain to a gravity bag. Site is unremarkable with no erythema, edema, tenderness, bleeding or drainage. Suture and stat lock in place. Dressing is clean, dry, and intact.  Clean bile in the bag. Drain aspirates and flushes well.    Skin:    General: Skin is warm and dry.     Coloration: Skin is not cyanotic or jaundiced.  Neurological:     Mental  Status: She is alert and oriented to person, place, and time.  Psychiatric:        Mood and Affect: Mood normal.        Behavior: Behavior normal.     Imaging: CT GUIDED VISCERAL FLUID DRAIN BY PERC CATH  Result Date: 12/28/2022 INDICATION: 77 year old female status post laparoscopic cholecystectomy complicated postoperatively by biloma presenting with sepsis. EXAM: CT IMAGE GUIDED DRAINAGE BY PERCUTANEOUS CATHETER x2 COMPARISON:  12/26/2022 MEDICATIONS: The patient is currently admitted to the hospital and receiving intravenous antibiotics. The antibiotics were administered within an appropriate time frame prior to the initiation of the procedure. ANESTHESIA/SEDATION:  Moderate (conscious) sedation was employed during this procedure. A total of Versed 1.5 mg and Fentanyl 75 mcg was administered intravenously. Moderate Sedation Time: 16 minutes. The patient's level of consciousness and vital signs were monitored continuously by radiology nursing throughout the procedure under my direct supervision. CONTRAST:  None COMPLICATIONS: None immediate. PROCEDURE: RADIATION DOSE REDUCTION: This exam was performed according to the departmental dose-optimization program which includes automated exposure control, adjustment of the mA and/or kV according to patient size and/or use of iterative reconstruction technique. Informed written consent was obtained from the patient after a discussion of the risks, benefits and alternatives to treatment. The patient was placed supine on the CT gantry and a pre procedural CT was performed re-demonstrating the known abscess/fluid collection within the gallbladder fossa and right perihepatic region. The procedure was planned. A timeout was performed prior to the initiation of the procedure. The right upper quadrant was prepped and draped in the usual sterile fashion. The overlying soft tissues were anesthetized with 1% lidocaine with epinephrine. Appropriate trajectory was planned with the use of a 22 gauge spinal needle. An 18 gauge trocar needle was advanced into the abscess/fluid collection and a short Amplatz super stiff wire was coiled within the collection. Appropriate positioning was confirmed with a limited CT scan. The tract was serially dilated allowing placement of a 8 [place] skater drainage catheter. Appropriate positioning was confirmed with a limited postprocedural CT scan. Approximately 30 ml of bilious fluid was aspirated. The tube was connected to a bulb suction and sutured in place. Given inadequate drainage of perihepatic fluid collection after gallbladder fossa drain placement, additional drain placement was warranted. Just superior to  the gallbladder fossa drain, the overlying soft tissues were anesthetized with 1% lidocaine with epinephrine. Appropriate trajectory was planned with the use of a 22 gauge spinal needle. An 18 gauge trocar needle was advanced into the abscess/fluid collection and a short Amplatz super stiff wire was coiled within the collection. Appropriate positioning was confirmed with a limited CT scan. The tract was serially dilated allowing placement of a 10 [place] multipurpose drainage catheter. Appropriate positioning was confirmed with a limited postprocedural CT scan. Approximately 1,000 ml of bilious fluid was aspirated. The tube was connected to a bag drainage and sutured in place. A fluid sample was sent to evaluate for culture as well as fluid bilirubin. A dressing was placed. The patient tolerated the procedure well without immediate post procedural complication. IMPRESSION: 1. Successful CT guided placement of a 8 [place] all purpose drain catheter into the gallbladder fossa with aspiration of 30 mL of bilious fluid. Samples were sent to the laboratory as requested by the ordering clinical team. 2. Successful CT-guided placement of a 10 French all-purpose drain catheter into the perihepatic fluid collection with aspiration of approximately 1 L of bilious fluid. Marliss Coots, MD [facility]  Radiology Electronically Signed   By: Marliss Coots M.D.   On: 12/28/2022 08:19   CT GUIDED VISCERAL FLUID DRAIN BY PERC CATH  Result Date: 12/28/2022 INDICATION: 77 year old female status post laparoscopic cholecystectomy complicated postoperatively by biloma presenting with sepsis. EXAM: CT IMAGE GUIDED DRAINAGE BY PERCUTANEOUS CATHETER x2 COMPARISON:  12/26/2022 MEDICATIONS: The patient is currently admitted to the hospital and receiving intravenous antibiotics. The antibiotics were administered within an appropriate time frame prior to the initiation of the procedure.  ANESTHESIA/SEDATION: Moderate (conscious) sedation was employed during this procedure. A total of Versed 1.5 mg and Fentanyl 75 mcg was administered intravenously. Moderate Sedation Time: 16 minutes. The patient's level of consciousness and vital signs were monitored continuously by radiology nursing throughout the procedure under my direct supervision. CONTRAST:  None COMPLICATIONS: None immediate. PROCEDURE: RADIATION DOSE REDUCTION: This exam was performed according to the departmental dose-optimization program which includes automated exposure control, adjustment of the mA and/or kV according to patient size and/or use of iterative reconstruction technique. Informed written consent was obtained from the patient after a discussion of the risks, benefits and alternatives to treatment. The patient was placed supine on the CT gantry and a pre procedural CT was performed re-demonstrating the known abscess/fluid collection within the gallbladder fossa and right perihepatic region. The procedure was planned. A timeout was performed prior to the initiation of the procedure. The right upper quadrant was prepped and draped in the usual sterile fashion. The overlying soft tissues were anesthetized with 1% lidocaine with epinephrine. Appropriate trajectory was planned with the use of a 22 gauge spinal needle. An 18 gauge trocar needle was advanced into the abscess/fluid collection and a short Amplatz super stiff wire was coiled within the collection. Appropriate positioning was confirmed with a limited CT scan. The tract was serially dilated allowing placement of a 8 [place] skater drainage catheter. Appropriate positioning was confirmed with a limited postprocedural CT scan. Approximately 30 ml of bilious fluid was aspirated. The tube was connected to a bulb suction and sutured in place. Given inadequate drainage of perihepatic fluid collection after gallbladder fossa drain placement, additional drain placement was  warranted. Just superior to the gallbladder fossa drain, the overlying soft tissues were anesthetized with 1% lidocaine with epinephrine. Appropriate trajectory was planned with the use of a 22 gauge spinal needle. An 18 gauge trocar needle was advanced into the abscess/fluid collection and a short Amplatz super stiff wire was coiled within the collection. Appropriate positioning was confirmed with a limited CT scan. The tract was serially dilated allowing placement of a 10 [place] multipurpose drainage catheter. Appropriate positioning was confirmed with a limited postprocedural CT scan. Approximately 1,000 ml of bilious fluid was aspirated. The tube was connected to a bag drainage and sutured in place. A fluid sample was sent to evaluate for culture as well as fluid bilirubin. A dressing was placed. The patient tolerated the procedure well without immediate post procedural complication. IMPRESSION: 1. Successful CT guided placement of a 8 [place] all purpose drain catheter into the gallbladder fossa with aspiration of 30 mL of bilious fluid. Samples were sent to the laboratory as requested by the ordering clinical team. 2. Successful CT-guided placement of a 10 French all-purpose drain catheter into the perihepatic fluid collection with aspiration of approximately 1 L of bilious fluid. Marliss Coots, MD [facility] Radiology Electronically Signed   By: Marliss Coots M.D.   On: 12/28/2022 08:19   MR LIVER W WO CONTRAST  Result Date: 12/27/2022 CLINICAL DATA:  77 year old female status post cholecystectomy with abdominal pain. Evaluate for potential bile leak. EXAM: MRI ABDOMEN WITHOUT AND WITH CONTRAST TECHNIQUE: Multiplanar multisequence MR imaging of the abdomen was performed both before and after the administration of intravenous contrast. CONTRAST:  9mL EOVIST GADOXETATE DISODIUM 0.25 MOL/L IV SOLN COMPARISON:  No prior abdominal MRI. CT of the abdomen and  pelvis 12/26/2022. FINDINGS: Lower chest: Small right pleural effusion lying dependently with areas of passive atelectasis in the lower lobes of the lungs bilaterally. Hepatobiliary: Liver has a shrunken appearance and nodular contour, indicative of underlying cirrhosis. No suspicious cystic or solid hepatic lesions. No intra or extrahepatic biliary ductal dilatation. Patient is status post cholecystectomy. Small amount of fluid in the gallbladder fossa is T1 hypointense and heterogeneously T2 hyperintense. On 20 minutes delayed post gadolinium images with Eovist there is a small amount of excreted contrast material which extends into the lateral aspect of this collection (best appreciated on axial image 55 of series 22). In addition, more abundant excretion of contrast is noted into a perihepatic fluid collection anterolaterally to the liver best appreciated on axial image 52 of series 22, indicative of postoperative bile leak with biloma in the gallbladder fossa and perihepatic region. This perihepatic fluid collection is T1 hypointense, T2 hyperintense and contains excreted contrast material and 20 minute post gadolinium delayed images, surrounding much of the central aspect of the liver adjacent to portions of segments 4 A, 4B, 5 and 8. Common bile duct measures 4 mm in the porta hepatis. No filling defect in the common bile duct to suggest choledocholithiasis. Pancreas: No pancreatic mass. No pancreatic ductal dilatation. No peripancreatic fluid collections or inflammatory changes. Spleen:  Unremarkable. Adrenals/Urinary Tract: Bilateral kidneys and adrenal glands are normal in appearance. No hydroureteronephrosis. Stomach/Bowel: Visualized portions are unremarkable. Vascular/Lymphatic: No aneurysm identified in the visualized abdominal vasculature. No lymphadenopathy noted in the abdomen. Other:  Trace volume of perihepatic ascites. Musculoskeletal: No aggressive appearing osseous lesions are noted in the  visualized portions of the skeleton. IMPRESSION: 1. Study is positive for postoperative bile leak with bilomas noted in the gallbladder fossa and overlying the central aspect of the liver, as detailed above. 2. Trace volume of perihepatic ascites. 3. Morphologic changes in the liver suggestive of cirrhosis. 4. Small right pleural effusion lying dependently. Passive areas of subsegmental atelectasis in the lower lobes of the lungs bilaterally. These results will be called to the ordering clinician or representative by the Radiologist Assistant, and communication documented in the PACS or [employer]. Electronically Signed   By: Trudie Reed M.D.   On: 12/27/2022 07:56   CT ABDOMEN PELVIS W CONTRAST  Result Date: 12/26/2022 CLINICAL DATA:  77 year old female with abdominal pain. Postoperative day 3 status post cholecystectomy. EXAM: CT ABDOMEN AND PELVIS WITH CONTRAST TECHNIQUE: Multidetector CT imaging of the abdomen and pelvis was performed using the standard protocol following bolus administration of intravenous contrast. RADIATION DOSE REDUCTION: This exam was performed according to the departmental dose-optimization program which includes automated exposure control, adjustment of the mA and/or kV according to patient size and/or use of iterative reconstruction technique. CONTRAST:  75mL OMNIPAQUE IOHEXOL 350 MG/ML SOLN COMPARISON:  CT Abdomen and Pelvis 10/21/2022 and earlier. FINDINGS: Lower chest: Small layering pleural effusions are new. No pericardial effusion. Mild dependent lung base opacity most resembles atelectasis. Hepatobiliary: Nodular liver contour with small volume new perihepatic ascites (series 3, image 23), simple fluid density. However, gallbladder also surgically absent now. Surgical clips  and expected postoperative changes in the gallbladder fossa. No discrete liver lesion. Pancreas: Atrophied. Spleen: Stable spleen at the upper limits of normal. Adrenals/Urinary Tract: Normal  adrenal glands. Nonobstructed kidneys with symmetric renal enhancement and contrast excretion. Distended but otherwise unremarkable bladder. Stomach/Bowel: No dilated large or small bowel loops. Retained stool in the colon. Diverticulosis of the descending and sigmoid colon. Redundant right colon. Cecum on a lax mesentery in the anterior abdomen. Normal appendix arising on series 3, image 66. No abnormal small bowel. Small gastric hiatal hernia. Small volume fluid in the stomach. Decompressed duodenum. No free air. Vascular/Lymphatic: Aortoiliac calcified atherosclerosis. Major arterial structures are patent. Normal caliber abdominal aorta. Portal venous system is patent. No lymphadenopathy identified. Reproductive: Negative. Other: Trace new pelvis free fluid. Mild ventral abdominal wall postoperative changes with no adverse features. Musculoskeletal: Right hip arthroplasty. Advanced degeneration in the spine. Chronic T12 compression fracture. Stable visualized osseous structures. IMPRESSION: 1. Recent cholecystectomy with largely expected postoperative changes. A small volume of Ascites is new, nonspecific, but with underlying nodular liver contour raising the possibility of Cirrhosis. 2. Small layering pleural effusions are new with lung base atelectasis. 3. No other acute or inflammatory process identified in the abdomen or pelvis. Aortic Atherosclerosis (ICD10-I70.0). Electronically Signed   By: Odessa Fleming M.D.   On: 12/26/2022 12:34    Labs:  CBC: Recent Labs    12/22/22 1205 12/26/22 0837 12/27/22 0228 12/28/22 0124  WBC 12.2* 17.3* 32.4* 21.7*  HGB 10.2* 11.3* 11.6* 10.9*  HCT 32.5* 35.5* 35.5* 33.1*  PLT 198 223 221 182    COAGS: Recent Labs    12/27/22 0742  INR 1.4*    BMP: Recent Labs    12/22/22 1205 12/26/22 0837 12/27/22 0228 12/28/22 0124  NA 134* 132* 129* 127*  K 4.3 4.2 4.2 3.7  CL 101 99 96* 94*  CO2 25 23 24 26   GLUCOSE 74 116* 179* 128*  BUN 19 16 13 13    CALCIUM 9.4 9.5 9.0 8.8*  CREATININE 0.82 0.96 0.76 0.77  GFRNONAA >60 >60 >60 >60    LIVER FUNCTION TESTS: Recent Labs    11/09/22 1329 12/26/22 0837 12/27/22 0228 12/28/22 0124  BILITOT 0.5 0.3 0.9 1.2  AST 20 33 36 20  ALT 15 27 36 24  ALKPHOS 54 63 60 60  PROT 6.4* 6.1* 5.4* 5.1*  ALBUMIN 3.4* 3.2* 2.7* 2.3*    Assessment and Plan:  77 y.o. female with s/p lap chole on 5/15 present with RUQ pain, bile leak and biloma, s/p aspiration and drain placement by Dr. Elby Showers on 12/27/22 - removed 1L of bilious fluid during the procedure.   WBC trending down 21.7 <<32.4  Afebrile  LFT wnl   Plan for ERCP and stent placement today.    Drain Location: RUQ Size: Fr size: 10 Fr Date of placement: 5/19  Currently to: Drain collection device: gravity 24 hour output:  Output by Drain (mL) 12/26/22 0701 - 12/26/22 1900 12/26/22 1901 - 12/27/22 0700 12/27/22 0701 - 12/27/22 1900 12/27/22 1901 - 12/28/22 0700 12/28/22 0701 - 12/28/22 1042  Closed System Drain 1 Medial RLQ 8 Fr.   15 420   Closed System Drain 2 Lateral RLQ 10 Fr.   690 70     Interval imaging/drain manipulation:  None   Current examination: Flushes/aspirates easily.  Insertion site unremarkable. Suture and stat lock in place. Dressed appropriately.   Plan: Continue TID flushes with 5 cc NS. Record output Q shift. Dressing changes  QD or PRN if soiled.  Call IR APP or on call IR MD if difficulty flushing or sudden change in drain output.  Repeat imaging/possible drain injection once output < 10 mL/QD (excluding flush material). Consideration for drain removal if output is < 10 mL/QD (excluding flush material), pending discussion with the providing surgical service.  Discharge planning: Please contact IR APP or on call IR MD prior to patient d/c to ensure appropriate follow up plans are in place. Typically patient will follow up with IR clinic 10-14 days post d/c for repeat imaging/possible drain injection. IR  scheduler will contact patient with date/time of appointment. Patient will need to flush drain QD with 5 cc NS, record output QD, dressing changes every 2-3 days or earlier if soiled.   IR will continue to follow - please call with questions or concerns.    Electronically Signed: Willette Brace, PA-C 12/28/2022, 10:40 AM   I spent a total of 15 Minutes at the the patient's bedside AND on the patient's hospital floor or unit, greater than 50% of which was counseling/coordinating care for biloma drain f/u.   This chart was dictated using voice recognition software.  Despite best efforts to proofread,  errors can occur which can change the documentation meaning.

## 2022-12-28 NOTE — Progress Notes (Signed)
  Transition of Care [facility]) Screening Note   Patient Details  Name: [name] [name] Date of Birth: [birth year]   Transition of Care [facility]) CM/SW Contact:    Harriet Masson, RN Phone Number: 12/28/2022, 1:33 PM    Transition of Care Department [facility]) has reviewed patient and no TOC needs have been identified at this time. We will continue to monitor patient advancement through interdisciplinary progression rounds. If new patient transition needs arise, please place a TOC consult.

## 2022-12-28 NOTE — Plan of Care (Signed)

## 2022-12-28 NOTE — Progress Notes (Signed)
Pt refusing bipap for the night. RN aware.

## 2022-12-28 NOTE — Interval H&P Note (Signed)
History and Physical Interval Note:  12/28/2022 12:59 PM  [name] [name]  has presented today for surgery, with the diagnosis of Bile leak.  The various methods of treatment have been discussed with the patient and family. After consideration of risks, benefits and other options for treatment, the patient has consented to  Procedure(s): ENDOSCOPIC RETROGRADE CHOLANGIOPANCREATOGRAPHY (ERCP) WITH PROPOFOL (N/A) as a surgical intervention.  The patient's history has been reviewed, patient examined, no change in status, stable for surgery.  I have reviewed the patient's chart and labs.  Questions were answered to the patient's satisfaction.     Stan Head

## 2022-12-28 NOTE — Progress Notes (Signed)
Visited with pt and significant other Nurse at [facility] as well Pt recounted her difficulty in getting adequate pain control over the weekend Feels better, pain much improved Alert, nontoxic Soft, some bruising around incisions; perc drain x 2 - bile  Discussed workup and management to date of postop bile leak Appears to be duct of luschka leak on ercp Discussed typical mgmt and drain mgmt Discussed typical criteria for dc Pt will need drain teaching Appreciate GI assistance Assisted nurse in correcting post ERCP orders in relation to [facility] Will look into pain control/contact over the weekend  [name], MD, FACS General, Bariatric, & Minimally Invasive Surgery [facility] Surgery,  [facility]

## 2022-12-29 DIAGNOSIS — K9189 Other postprocedural complications and disorders of digestive system: Secondary | ICD-10-CM | POA: Diagnosis not present

## 2022-12-29 DIAGNOSIS — R932 Abnormal findings on diagnostic imaging of liver and biliary tract: Secondary | ICD-10-CM | POA: Diagnosis not present

## 2022-12-29 DIAGNOSIS — Z9049 Acquired absence of other specified parts of digestive tract: Secondary | ICD-10-CM | POA: Diagnosis not present

## 2022-12-29 LAB — CBC
HCT: 28.4 % — ABNORMAL LOW (ref 36.0–46.0)
Hemoglobin: 9.4 g/dL — ABNORMAL LOW (ref 12.0–15.0)
MCH: 30.4 pg (ref 26.0–34.0)
MCHC: 33.1 g/dL (ref 30.0–36.0)
MCV: 91.9 fL (ref 80.0–100.0)
Platelets: 190 10*3/uL (ref 150–400)
RBC: 3.09 MIL/uL — ABNORMAL LOW (ref 3.87–5.11)
RDW: 17.2 % — ABNORMAL HIGH (ref 11.5–15.5)
WBC: 15.7 10*3/uL — ABNORMAL HIGH (ref 4.0–10.5)
nRBC: 0 % (ref 0.0–0.2)

## 2022-12-29 LAB — GLUCOSE, CAPILLARY
Glucose-Capillary: 106 mg/dL — ABNORMAL HIGH (ref 70–99)
Glucose-Capillary: 131 mg/dL — ABNORMAL HIGH (ref 70–99)
Glucose-Capillary: 139 mg/dL — ABNORMAL HIGH (ref 70–99)
Glucose-Capillary: 152 mg/dL — ABNORMAL HIGH (ref 70–99)
Glucose-Capillary: 164 mg/dL — ABNORMAL HIGH (ref 70–99)
Glucose-Capillary: 96 mg/dL (ref 70–99)

## 2022-12-29 LAB — COMPREHENSIVE METABOLIC PANEL
ALT: 16 U/L (ref 0–44)
AST: 13 U/L — ABNORMAL LOW (ref 15–41)
Albumin: 2 g/dL — ABNORMAL LOW (ref 3.5–5.0)
Alkaline Phosphatase: 56 U/L (ref 38–126)
Anion gap: 6 (ref 5–15)
BUN: 16 mg/dL (ref 8–23)
CO2: 26 mmol/L (ref 22–32)
Calcium: 8.5 mg/dL — ABNORMAL LOW (ref 8.9–10.3)
Chloride: 99 mmol/L (ref 98–111)
Creatinine, Ser: 0.91 mg/dL (ref 0.44–1.00)
GFR, Estimated: >60 mL/min (ref >60)
Glucose, Bld: 119 mg/dL — ABNORMAL HIGH (ref 70–99)
Potassium: 3.8 mmol/L (ref 3.5–5.1)
Sodium: 131 mmol/L — ABNORMAL LOW (ref 135–145)
Total Bilirubin: 0.6 mg/dL (ref 0.3–1.2)
Total Protein: 4.6 g/dL — ABNORMAL LOW (ref 6.5–8.1)

## 2022-12-29 MED ORDER — POLYETHYLENE GLYCOL 3350 17 G PO PACK: 17.0000 g | PACK | Freq: Every day | ORAL | Status: DC | PRN

## 2022-12-29 MED ORDER — CARVEDILOL 6.25 MG PO TABS
6.2500 mg | ORAL_TABLET | Freq: Two times a day (BID) | ORAL | Status: DC
  Administered 2022-12-29 – 2022-12-30 (×3): 6.25 mg via ORAL
  Filled 2022-12-29 (×3): qty 1

## 2022-12-29 MED ORDER — ALUM & MAG HYDROXIDE-SIMETH 200-200-20 MG/5ML PO SUSP
30.0000 mL | Freq: Four times a day (QID) | ORAL | Status: DC | PRN
  Administered 2022-12-29 – 2022-12-30 (×3): 30 mL via ORAL
  Filled 2022-12-29 (×3): qty 30

## 2022-12-29 MED ORDER — DOCUSATE SODIUM 100 MG PO CAPS
100.0000 mg | ORAL_CAPSULE | Freq: Two times a day (BID) | ORAL | Status: DC
  Administered 2022-12-29 – 2022-12-30 (×2): 100 mg via ORAL
  Filled 2022-12-29 (×3): qty 1

## 2022-12-29 NOTE — Progress Notes (Signed)
Patient refused BIPAP for the night. Patient stated she had no increased shortness of breath and felt fine with just the oxygen.

## 2022-12-29 NOTE — Progress Notes (Signed)
Progress Note  1 Day Post-Op  Subjective: Patient reports she is still having pain but significantly improved from what it was. Denies nausea or vomiting and actually feels hungry this AM. Passing some flatus but not much.   Objective: Vital signs in last 24 hours: Temp:  [97.5 F (36.4 C)-98.4 F (36.9 C)] 98.3 F (36.8 C) (05/21 0732) Pulse Rate:  [73-101] 75 (05/21 0732) Resp:  [14-19] 15 (05/21 0732) BP: (127-172)/(48-80) 129/67 (05/21 0732) SpO2:  [91 %-98 %] 98 % (05/21 0732) Last BM Date : 12/25/22  Intake/Output from previous day: 05/20 0701 - 05/21 0700 In: 1501.9 [I.V.:1380.5; IV Piggyback:111.4] Out: 840 [Urine:700; Drains:140] Intake/Output this shift: No intake/output data recorded.  PE: General: pleasant, WD, obese female who is laying in bed in NAD HEENT: sclera anicteric  Heart: regular, rate, and rhythm.  Lungs: Respiratory effort nonlabored Abd: soft, mild generalized ttp, drains with small amount bilious fluid, mild distention, +BS, incisions well healing  Psych: A&Ox3 with an appropriate affect.    Lab Results:  Recent Labs    12/28/22 0124 12/29/22 0341  WBC 21.7* 15.7*  HGB 10.9* 9.4*  HCT 33.1* 28.4*  PLT 182 190   BMET Recent Labs    12/28/22 0124 12/29/22 0341  NA 127* 131*  K 3.7 3.8  CL 94* 99  CO2 26 26  GLUCOSE 128* 119*  BUN 13 16  CREATININE 0.77 0.91  CALCIUM 8.8* 8.5*   PT/INR Recent Labs    12/27/22 0742  LABPROT 17.7*  INR 1.4*   CMP     Component Value Date/Time   NA 131 (L) 12/29/2022 0341   NA 136 01/13/2019 1403   K 3.8 12/29/2022 0341   CL 99 12/29/2022 0341   CO2 26 12/29/2022 0341   GLUCOSE 119 (H) 12/29/2022 0341   BUN 16 12/29/2022 0341   BUN 20 01/13/2019 1403   CREATININE 0.91 12/29/2022 0341   CALCIUM 8.5 (L) 12/29/2022 0341   PROT 4.6 (L) 12/29/2022 0341   PROT 6.3 01/13/2019 1403   ALBUMIN 2.0 (L) 12/29/2022 0341   ALBUMIN 3.9 01/13/2019 1403   AST 13 (L) 12/29/2022 0341   ALT 16  12/29/2022 0341   ALKPHOS 56 12/29/2022 0341   BILITOT 0.6 12/29/2022 0341   BILITOT 0.2 01/13/2019 1403   GFRNONAA >60 12/29/2022 0341   GFRAA >60 03/12/2020 1747   Lipase     Component Value Date/Time   LIPASE 20 12/26/2022 0837       Studies/Results: DG ERCP  Result Date: 12/28/2022 CLINICAL DATA:  [id number] Surgery, elective [id number], post cholecystectomy, status post gallbladder fossa and perihepatic drain catheter placement EXAM: ERCP TECHNIQUE: Multiple spot images obtained with the fluoroscopic device and submitted for interpretation post-procedure. COMPARISON:  None Available. FINDINGS: A series of fluoroscopic spot images document endoscopic cannulation and opacification of the CBD and cystic duct stump. No definite leak from the cystic duct stump but there is some extravasation at the inferior or lateral margin of the right lobe extending adjacent to the drain catheter. Intrahepatic biliary ducts appear decompressed. IMPRESSION: 1. No definite leak from the cystic duct stump. 2. Extravasation adjacent to the drain catheter, possibly from duct of Luschka or injury to small intrahepatic ducts. Electronically Signed   By: Corlis Leak M.D.   On: 12/28/2022 15:10   CT GUIDED VISCERAL FLUID DRAIN BY PERC CATH  Result Date: 12/28/2022 INDICATION: 77 year old female status post laparoscopic cholecystectomy complicated postoperatively by biloma presenting with sepsis. EXAM: CT IMAGE  GUIDED DRAINAGE BY PERCUTANEOUS CATHETER x2 COMPARISON:  12/26/2022 MEDICATIONS: The patient is currently admitted to the hospital and receiving intravenous antibiotics. The antibiotics were administered within an appropriate time frame prior to the initiation of the procedure. ANESTHESIA/SEDATION: Moderate (conscious) sedation was employed during this procedure. A total of Versed 1.5 mg and Fentanyl 75 mcg was administered intravenously. Moderate Sedation Time: 16 minutes. The patient's level of consciousness and  vital signs were monitored continuously by radiology nursing throughout the procedure under my direct supervision. CONTRAST:  None COMPLICATIONS: None immediate. PROCEDURE: RADIATION DOSE REDUCTION: This exam was performed according to the departmental dose-optimization program which includes automated exposure control, adjustment of the mA and/or kV according to patient size and/or use of iterative reconstruction technique. Informed written consent was obtained from the patient after a discussion of the risks, benefits and alternatives to treatment. The patient was placed supine on the CT gantry and a pre procedural CT was performed re-demonstrating the known abscess/fluid collection within the gallbladder fossa and right perihepatic region. The procedure was planned. A timeout was performed prior to the initiation of the procedure. The right upper quadrant was prepped and draped in the usual sterile fashion. The overlying soft tissues were anesthetized with 1% lidocaine with epinephrine. Appropriate trajectory was planned with the use of a 22 gauge spinal needle. An 18 gauge trocar needle was advanced into the abscess/fluid collection and a short Amplatz super stiff wire was coiled within the collection. Appropriate positioning was confirmed with a limited CT scan. The tract was serially dilated allowing placement of a 8 [place] skater drainage catheter. Appropriate positioning was confirmed with a limited postprocedural CT scan. Approximately 30 ml of bilious fluid was aspirated. The tube was connected to a bulb suction and sutured in place. Given inadequate drainage of perihepatic fluid collection after gallbladder fossa drain placement, additional drain placement was warranted. Just superior to the gallbladder fossa drain, the overlying soft tissues were anesthetized with 1% lidocaine with epinephrine. Appropriate trajectory was planned with the use of a 22 gauge spinal needle. An 18 gauge trocar needle was  advanced into the abscess/fluid collection and a short Amplatz super stiff wire was coiled within the collection. Appropriate positioning was confirmed with a limited CT scan. The tract was serially dilated allowing placement of a 10 [place] multipurpose drainage catheter. Appropriate positioning was confirmed with a limited postprocedural CT scan. Approximately 1,000 ml of bilious fluid was aspirated. The tube was connected to a bag drainage and sutured in place. A fluid sample was sent to evaluate for culture as well as fluid bilirubin. A dressing was placed. The patient tolerated the procedure well without immediate post procedural complication. IMPRESSION: 1. Successful CT guided placement of a 8 [place] all purpose drain catheter into the gallbladder fossa with aspiration of 30 mL of bilious fluid. Samples were sent to the laboratory as requested by the ordering clinical team. 2. Successful CT-guided placement of a 10 French all-purpose drain catheter into the perihepatic fluid collection with aspiration of approximately 1 L of bilious fluid. Marliss Coots, MD [facility] Radiology Electronically Signed   By: Marliss Coots M.D.   On: 12/28/2022 08:19   CT GUIDED VISCERAL FLUID DRAIN BY PERC CATH  Result Date: 12/28/2022 INDICATION: 77 year old female status post laparoscopic cholecystectomy complicated postoperatively by biloma presenting with sepsis. EXAM: CT IMAGE GUIDED DRAINAGE BY PERCUTANEOUS CATHETER x2 COMPARISON:  12/26/2022 MEDICATIONS: The patient is currently admitted to the hospital and receiving intravenous antibiotics. The  antibiotics were administered within an appropriate time frame prior to the initiation of the procedure. ANESTHESIA/SEDATION: Moderate (conscious) sedation was employed during this procedure. A total of Versed 1.5 mg and Fentanyl 75 mcg was administered intravenously. Moderate Sedation Time: 16 minutes. The patient's level of  consciousness and vital signs were monitored continuously by radiology nursing throughout the procedure under my direct supervision. CONTRAST:  None COMPLICATIONS: None immediate. PROCEDURE: RADIATION DOSE REDUCTION: This exam was performed according to the departmental dose-optimization program which includes automated exposure control, adjustment of the mA and/or kV according to patient size and/or use of iterative reconstruction technique. Informed written consent was obtained from the patient after a discussion of the risks, benefits and alternatives to treatment. The patient was placed supine on the CT gantry and a pre procedural CT was performed re-demonstrating the known abscess/fluid collection within the gallbladder fossa and right perihepatic region. The procedure was planned. A timeout was performed prior to the initiation of the procedure. The right upper quadrant was prepped and draped in the usual sterile fashion. The overlying soft tissues were anesthetized with 1% lidocaine with epinephrine. Appropriate trajectory was planned with the use of a 22 gauge spinal needle. An 18 gauge trocar needle was advanced into the abscess/fluid collection and a short Amplatz super stiff wire was coiled within the collection. Appropriate positioning was confirmed with a limited CT scan. The tract was serially dilated allowing placement of a 8 [place] skater drainage catheter. Appropriate positioning was confirmed with a limited postprocedural CT scan. Approximately 30 ml of bilious fluid was aspirated. The tube was connected to a bulb suction and sutured in place. Given inadequate drainage of perihepatic fluid collection after gallbladder fossa drain placement, additional drain placement was warranted. Just superior to the gallbladder fossa drain, the overlying soft tissues were anesthetized with 1% lidocaine with epinephrine. Appropriate trajectory was planned with the use of a 22 gauge spinal needle. An 18 gauge  trocar needle was advanced into the abscess/fluid collection and a short Amplatz super stiff wire was coiled within the collection. Appropriate positioning was confirmed with a limited CT scan. The tract was serially dilated allowing placement of a 10 [place] multipurpose drainage catheter. Appropriate positioning was confirmed with a limited postprocedural CT scan. Approximately 1,000 ml of bilious fluid was aspirated. The tube was connected to a bag drainage and sutured in place. A fluid sample was sent to evaluate for culture as well as fluid bilirubin. A dressing was placed. The patient tolerated the procedure well without immediate post procedural complication. IMPRESSION: 1. Successful CT guided placement of a 8 [place] all purpose drain catheter into the gallbladder fossa with aspiration of 30 mL of bilious fluid. Samples were sent to the laboratory as requested by the ordering clinical team. 2. Successful CT-guided placement of a 10 French all-purpose drain catheter into the perihepatic fluid collection with aspiration of approximately 1 L of bilious fluid. Marliss Coots, MD [facility] Radiology Electronically Signed   By: Marliss Coots M.D.   On: 12/28/2022 08:19    Anti-infectives: Anti-infectives (From admission, onward)    Start     Dose/Rate Route Frequency Ordered Stop   12/27/22 0915  piperacillin-tazobactam (ZOSYN) IVPB 3.375 g        3.375 g 12.5 mL/hr over 240 Minutes Intravenous Every 8 hours 12/27/22 0815          Assessment/Plan  S/p lap chole 5/15 Dr. Gaynelle Adu Bile leak - afebrile, WBC trending down, 15  today  - s/p IR biloma drains 5/19 - ERCP with stent 5/20 - bile leaking from duct of Luschka  - advance to soft diet this AM - drain teaching - may be ready for DC in the next 24-48 hrs if comfortable with drain care and continues to improve - encouraged mobilization    FEN: NPO, IVF at 100 mL/hr, start salt tabs TID;  hyponatremia (127 today) VTE: Hold pharm dvt prophlaxis for procedure today ID: Zosyn  Pain: IV tylenol, add IV robaxin PRN for pain, 5 mg oxycodone q4h PRN, dilaudid for breakthrough    DM-SSI HTN-home meds  Hyponatremia - 127 yesterday, started on salt tabs - improving 131 today   LOS: 2 days     Juliet Rude, [facility] Surgery 12/29/2022, 10:19 AM Please see Amion for pager number during day hours 7:00am-4:30pm

## 2022-12-29 NOTE — Plan of Care (Signed)

## 2022-12-29 NOTE — Progress Notes (Signed)
Referring Physician(s): Dr. Dwain Sarna  Supervising Physician: Gilmer Mor  Patient Status:  [facility] - In-pt  Chief Complaint: Cholecystectomy 12/23/22 with post-op bile leak. Biloma drain and peri-hepatic drains placed 12/27/22 by Dr. Elby Showers. Patient now s/p ERCP with stent placement 12/28/22.   Subjective: Patient sitting up in the chair, she is comfortable and watching TV. She states she feels better today.   Allergies: Other, Thorazine [chlorpromazine], Bee venom, Farxiga [dapagliflozin], Semaglutide, and Sulfa antibiotics  Medications: Prior to Admission medications   Medication Sig Start Date End Date Taking? Authorizing Provider  carvedilol (COREG) 6.25 MG tablet Take 6.25 mg by mouth 2 (two) times daily. 12/26/22  Yes [provider]  cyclobenzaprine (FLEXERIL) 10 MG tablet TAKE 1 TABLET BY MOUTH AT BEDTIME Patient taking differently: Take 10 mg by mouth at bedtime. 09/07/22  Yes Burns, Bobette Mo, MD  insulin glargine (LANTUS SOLOSTAR) 100 UNIT/ML Solostar Pen Inject 20 Units into the skin 2 (two) times daily. 10/14/22  Yes Burns, Bobette Mo, MD  lisinopril (ZESTRIL) 20 MG tablet Take 1 tablet (20 mg total) by mouth daily. 10/20/21  Yes Lennette Bihari, MD  metFORMIN (GLUCOPHAGE-XR) 500 MG 24 hr tablet TAKE 1 TABLET BY MOUTH TWICE A DAY 07/24/22  Yes Burns, Bobette Mo, MD  methotrexate (RHEUMATREX) 2.5 MG tablet Take 20 mg by mouth every Sunday at 6pm. 06/24/16  Yes [provider]  omeprazole (PRILOSEC) 40 MG capsule Take 1 capsule (40 mg total) by mouth 2 (two) times daily. 11/11/22  Yes Imogene Burn, MD  oxyCODONE-acetaminophen (PERCOCET) 10-325 MG tablet Take 1 tablet by mouth every 8 (eight) hours as needed for pain. 09/14/22  Yes [provider]  potassium chloride (KLOR-CON M) 10 MEQ tablet Take 2 tablets (20 mEq total) by mouth daily. Patient taking differently: Take 10 mEq by mouth 2 (two) times daily. 09/18/22  Yes Burns, Bobette Mo, MD  rosuvastatin (CRESTOR) 5 MG  tablet TAKE 1 TABLET(5 MG) BY MOUTH DAILY Patient taking differently: Take 5 mg by mouth every evening. 03/19/22  Yes Burns, Bobette Mo, MD  sertraline (ZOLOFT) 100 MG tablet Take 1.5 tablets (150 mg total) by mouth daily. 09/21/22  Yes Pincus Sanes, MD     Vital Signs: BP 129/67 (BP Location: Left Arm)   Pulse 75   Temp 98.3 F (36.8 C) (Oral)   Resp 15   Ht 5\' 6"  (1.676 m)   Wt 199 lb 1.2 oz (90.3 kg)   SpO2 98%   BMI 32.13 kg/m   Physical Exam Constitutional:      General: She is not in acute distress.    Appearance: She is not ill-appearing.  Pulmonary:     Effort: Pulmonary effort is normal.  Abdominal:     Palpations: Abdomen is soft.     Tenderness: There is no abdominal tenderness.     Comments: RUQ drains with dressings that are clean, dry and intact. One drain is to gravity and one is to suction. Both drains easily flushed with 5 ml NS. Approximately 10-15 ml of serosanguineous/bile-tinged fluid in each container.   Skin:    General: Skin is warm and dry.  Neurological:     Mental Status: She is alert and oriented to person, place, and time.     Imaging: DG ERCP  Result Date: 12/28/2022 CLINICAL DATA:  [id number] Surgery, elective [id number], post cholecystectomy, status post gallbladder fossa and perihepatic drain catheter placement EXAM: ERCP TECHNIQUE: Multiple spot images obtained with the fluoroscopic device  and submitted for interpretation post-procedure. COMPARISON:  None Available. FINDINGS: A series of fluoroscopic spot images document endoscopic cannulation and opacification of the CBD and cystic duct stump. No definite leak from the cystic duct stump but there is some extravasation at the inferior or lateral margin of the right lobe extending adjacent to the drain catheter. Intrahepatic biliary ducts appear decompressed. IMPRESSION: 1. No definite leak from the cystic duct stump. 2. Extravasation adjacent to the drain catheter, possibly from duct of Luschka or injury  to small intrahepatic ducts. Electronically Signed   By: Corlis Leak M.D.   On: 12/28/2022 15:10   CT GUIDED VISCERAL FLUID DRAIN BY PERC CATH  Result Date: 12/28/2022 INDICATION: 77 year old female status post laparoscopic cholecystectomy complicated postoperatively by biloma presenting with sepsis. EXAM: CT IMAGE GUIDED DRAINAGE BY PERCUTANEOUS CATHETER x2 COMPARISON:  12/26/2022 MEDICATIONS: The patient is currently admitted to the hospital and receiving intravenous antibiotics. The antibiotics were administered within an appropriate time frame prior to the initiation of the procedure. ANESTHESIA/SEDATION: Moderate (conscious) sedation was employed during this procedure. A total of Versed 1.5 mg and Fentanyl 75 mcg was administered intravenously. Moderate Sedation Time: 16 minutes. The patient's level of consciousness and vital signs were monitored continuously by radiology nursing throughout the procedure under my direct supervision. CONTRAST:  None COMPLICATIONS: None immediate. PROCEDURE: RADIATION DOSE REDUCTION: This exam was performed according to the departmental dose-optimization program which includes automated exposure control, adjustment of the mA and/or kV according to patient size and/or use of iterative reconstruction technique. Informed written consent was obtained from the patient after a discussion of the risks, benefits and alternatives to treatment. The patient was placed supine on the CT gantry and a pre procedural CT was performed re-demonstrating the known abscess/fluid collection within the gallbladder fossa and right perihepatic region. The procedure was planned. A timeout was performed prior to the initiation of the procedure. The right upper quadrant was prepped and draped in the usual sterile fashion. The overlying soft tissues were anesthetized with 1% lidocaine with epinephrine. Appropriate trajectory was planned with the use of a 22 gauge spinal needle. An 18 gauge trocar needle  was advanced into the abscess/fluid collection and a short Amplatz super stiff wire was coiled within the collection. Appropriate positioning was confirmed with a limited CT scan. The tract was serially dilated allowing placement of a 8 [place] skater drainage catheter. Appropriate positioning was confirmed with a limited postprocedural CT scan. Approximately 30 ml of bilious fluid was aspirated. The tube was connected to a bulb suction and sutured in place. Given inadequate drainage of perihepatic fluid collection after gallbladder fossa drain placement, additional drain placement was warranted. Just superior to the gallbladder fossa drain, the overlying soft tissues were anesthetized with 1% lidocaine with epinephrine. Appropriate trajectory was planned with the use of a 22 gauge spinal needle. An 18 gauge trocar needle was advanced into the abscess/fluid collection and a short Amplatz super stiff wire was coiled within the collection. Appropriate positioning was confirmed with a limited CT scan. The tract was serially dilated allowing placement of a 10 [place] multipurpose drainage catheter. Appropriate positioning was confirmed with a limited postprocedural CT scan. Approximately 1,000 ml of bilious fluid was aspirated. The tube was connected to a bag drainage and sutured in place. A fluid sample was sent to evaluate for culture as well as fluid bilirubin. A dressing was placed. The patient tolerated the procedure well without immediate post procedural complication. IMPRESSION: 1. Successful CT guided placement of  a 8 [place] all purpose drain catheter into the gallbladder fossa with aspiration of 30 mL of bilious fluid. Samples were sent to the laboratory as requested by the ordering clinical team. 2. Successful CT-guided placement of a 10 French all-purpose drain catheter into the perihepatic fluid collection with aspiration of approximately 1 L of bilious fluid. Marliss Coots, MD [facility] Radiology Electronically Signed   By: Marliss Coots M.D.   On: 12/28/2022 08:19   CT GUIDED VISCERAL FLUID DRAIN BY PERC CATH  Result Date: 12/28/2022 INDICATION: 77 year old female status post laparoscopic cholecystectomy complicated postoperatively by biloma presenting with sepsis. EXAM: CT IMAGE GUIDED DRAINAGE BY PERCUTANEOUS CATHETER x2 COMPARISON:  12/26/2022 MEDICATIONS: The patient is currently admitted to the hospital and receiving intravenous antibiotics. The antibiotics were administered within an appropriate time frame prior to the initiation of the procedure. ANESTHESIA/SEDATION: Moderate (conscious) sedation was employed during this procedure. A total of Versed 1.5 mg and Fentanyl 75 mcg was administered intravenously. Moderate Sedation Time: 16 minutes. The patient's level of consciousness and vital signs were monitored continuously by radiology nursing throughout the procedure under my direct supervision. CONTRAST:  None COMPLICATIONS: None immediate. PROCEDURE: RADIATION DOSE REDUCTION: This exam was performed according to the departmental dose-optimization program which includes automated exposure control, adjustment of the mA and/or kV according to patient size and/or use of iterative reconstruction technique. Informed written consent was obtained from the patient after a discussion of the risks, benefits and alternatives to treatment. The patient was placed supine on the CT gantry and a pre procedural CT was performed re-demonstrating the known abscess/fluid collection within the gallbladder fossa and right perihepatic region. The procedure was planned. A timeout was performed prior to the initiation of the procedure. The right upper quadrant was prepped and draped in the usual sterile fashion. The overlying soft tissues were anesthetized with 1% lidocaine with epinephrine. Appropriate trajectory was planned with the use of a 22 gauge spinal needle. An 18  gauge trocar needle was advanced into the abscess/fluid collection and a short Amplatz super stiff wire was coiled within the collection. Appropriate positioning was confirmed with a limited CT scan. The tract was serially dilated allowing placement of a 8 [place] skater drainage catheter. Appropriate positioning was confirmed with a limited postprocedural CT scan. Approximately 30 ml of bilious fluid was aspirated. The tube was connected to a bulb suction and sutured in place. Given inadequate drainage of perihepatic fluid collection after gallbladder fossa drain placement, additional drain placement was warranted. Just superior to the gallbladder fossa drain, the overlying soft tissues were anesthetized with 1% lidocaine with epinephrine. Appropriate trajectory was planned with the use of a 22 gauge spinal needle. An 18 gauge trocar needle was advanced into the abscess/fluid collection and a short Amplatz super stiff wire was coiled within the collection. Appropriate positioning was confirmed with a limited CT scan. The tract was serially dilated allowing placement of a 10 [place] multipurpose drainage catheter. Appropriate positioning was confirmed with a limited postprocedural CT scan. Approximately 1,000 ml of bilious fluid was aspirated. The tube was connected to a bag drainage and sutured in place. A fluid sample was sent to evaluate for culture as well as fluid bilirubin. A dressing was placed. The patient tolerated the procedure well without immediate post procedural complication. IMPRESSION: 1. Successful CT guided placement of a 8 [place] all purpose drain catheter into the gallbladder fossa with aspiration of 30 mL of bilious fluid. Samples were sent to  the laboratory as requested by the ordering clinical team. 2. Successful CT-guided placement of a 10 French all-purpose drain catheter into the perihepatic fluid collection with aspiration of approximately 1 L of bilious fluid. Marliss Coots, MD [facility] Radiology Electronically Signed   By: Marliss Coots M.D.   On: 12/28/2022 08:19   MR LIVER W WO CONTRAST  Result Date: 12/27/2022 CLINICAL DATA:  77 year old female status post cholecystectomy with abdominal pain. Evaluate for potential bile leak. EXAM: MRI ABDOMEN WITHOUT AND WITH CONTRAST TECHNIQUE: Multiplanar multisequence MR imaging of the abdomen was performed both before and after the administration of intravenous contrast. CONTRAST:  9mL EOVIST GADOXETATE DISODIUM 0.25 MOL/L IV SOLN COMPARISON:  No prior abdominal MRI. CT of the abdomen and pelvis 12/26/2022. FINDINGS: Lower chest: Small right pleural effusion lying dependently with areas of passive atelectasis in the lower lobes of the lungs bilaterally. Hepatobiliary: Liver has a shrunken appearance and nodular contour, indicative of underlying cirrhosis. No suspicious cystic or solid hepatic lesions. No intra or extrahepatic biliary ductal dilatation. Patient is status post cholecystectomy. Small amount of fluid in the gallbladder fossa is T1 hypointense and heterogeneously T2 hyperintense. On 20 minutes delayed post gadolinium images with Eovist there is a small amount of excreted contrast material which extends into the lateral aspect of this collection (best appreciated on axial image 55 of series 22). In addition, more abundant excretion of contrast is noted into a perihepatic fluid collection anterolaterally to the liver best appreciated on axial image 52 of series 22, indicative of postoperative bile leak with biloma in the gallbladder fossa and perihepatic region. This perihepatic fluid collection is T1 hypointense, T2 hyperintense and contains excreted contrast material and 20 minute post gadolinium delayed images, surrounding much of the central aspect of the liver adjacent to portions of segments 4 A, 4B, 5 and 8. Common bile duct measures 4 mm in the porta hepatis. No filling defect in the  common bile duct to suggest choledocholithiasis. Pancreas: No pancreatic mass. No pancreatic ductal dilatation. No peripancreatic fluid collections or inflammatory changes. Spleen:  Unremarkable. Adrenals/Urinary Tract: Bilateral kidneys and adrenal glands are normal in appearance. No hydroureteronephrosis. Stomach/Bowel: Visualized portions are unremarkable. Vascular/Lymphatic: No aneurysm identified in the visualized abdominal vasculature. No lymphadenopathy noted in the abdomen. Other:  Trace volume of perihepatic ascites. Musculoskeletal: No aggressive appearing osseous lesions are noted in the visualized portions of the skeleton. IMPRESSION: 1. Study is positive for postoperative bile leak with bilomas noted in the gallbladder fossa and overlying the central aspect of the liver, as detailed above. 2. Trace volume of perihepatic ascites. 3. Morphologic changes in the liver suggestive of cirrhosis. 4. Small right pleural effusion lying dependently. Passive areas of subsegmental atelectasis in the lower lobes of the lungs bilaterally. These results will be called to the ordering clinician or representative by the Radiologist Assistant, and communication documented in the PACS or [employer]. Electronically Signed   By: Trudie Reed M.D.   On: 12/27/2022 07:56   CT ABDOMEN PELVIS W CONTRAST  Result Date: 12/26/2022 CLINICAL DATA:  77 year old female with abdominal pain. Postoperative day 3 status post cholecystectomy. EXAM: CT ABDOMEN AND PELVIS WITH CONTRAST TECHNIQUE: Multidetector CT imaging of the abdomen and pelvis was performed using the standard protocol following bolus administration of intravenous contrast. RADIATION DOSE REDUCTION: This exam was performed according to the departmental dose-optimization program which includes automated exposure control, adjustment of the mA and/or kV according to patient size and/or use  of iterative reconstruction technique. CONTRAST:  75mL OMNIPAQUE IOHEXOL  350 MG/ML SOLN COMPARISON:  CT Abdomen and Pelvis 10/21/2022 and earlier. FINDINGS: Lower chest: Small layering pleural effusions are new. No pericardial effusion. Mild dependent lung base opacity most resembles atelectasis. Hepatobiliary: Nodular liver contour with small volume new perihepatic ascites (series 3, image 23), simple fluid density. However, gallbladder also surgically absent now. Surgical clips and expected postoperative changes in the gallbladder fossa. No discrete liver lesion. Pancreas: Atrophied. Spleen: Stable spleen at the upper limits of normal. Adrenals/Urinary Tract: Normal adrenal glands. Nonobstructed kidneys with symmetric renal enhancement and contrast excretion. Distended but otherwise unremarkable bladder. Stomach/Bowel: No dilated large or small bowel loops. Retained stool in the colon. Diverticulosis of the descending and sigmoid colon. Redundant right colon. Cecum on a lax mesentery in the anterior abdomen. Normal appendix arising on series 3, image 66. No abnormal small bowel. Small gastric hiatal hernia. Small volume fluid in the stomach. Decompressed duodenum. No free air. Vascular/Lymphatic: Aortoiliac calcified atherosclerosis. Major arterial structures are patent. Normal caliber abdominal aorta. Portal venous system is patent. No lymphadenopathy identified. Reproductive: Negative. Other: Trace new pelvis free fluid. Mild ventral abdominal wall postoperative changes with no adverse features. Musculoskeletal: Right hip arthroplasty. Advanced degeneration in the spine. Chronic T12 compression fracture. Stable visualized osseous structures. IMPRESSION: 1. Recent cholecystectomy with largely expected postoperative changes. A small volume of Ascites is new, nonspecific, but with underlying nodular liver contour raising the possibility of Cirrhosis. 2. Small layering pleural effusions are new with lung base atelectasis. 3. No other acute or inflammatory process identified in the  abdomen or pelvis. Aortic Atherosclerosis (ICD10-I70.0). Electronically Signed   By: Odessa Fleming M.D.   On: 12/26/2022 12:34    Labs:  CBC: Recent Labs    12/26/22 0837 12/27/22 0228 12/28/22 0124 12/29/22 0341  WBC 17.3* 32.4* 21.7* 15.7*  HGB 11.3* 11.6* 10.9* 9.4*  HCT 35.5* 35.5* 33.1* 28.4*  PLT 223 221 182 190    COAGS: Recent Labs    12/27/22 0742  INR 1.4*    BMP: Recent Labs    12/26/22 0837 12/27/22 0228 12/28/22 0124 12/29/22 0341  NA 132* 129* 127* 131*  K 4.2 4.2 3.7 3.8  CL 99 96* 94* 99  CO2 23 24 26 26   GLUCOSE 116* 179* 128* 119*  BUN 16 13 13 16   CALCIUM 9.5 9.0 8.8* 8.5*  CREATININE 0.96 0.76 0.77 0.91  GFRNONAA >60 >60 >60 >60    LIVER FUNCTION TESTS: Recent Labs    12/26/22 0837 12/27/22 0228 12/28/22 0124 12/29/22 0341  BILITOT 0.3 0.9 1.2 0.6  AST 33 36 20 13*  ALT 27 36 24 16  ALKPHOS 63 60 60 56  PROT 6.1* 5.4* 5.1* 4.6*  ALBUMIN 3.2* 2.7* 2.3* 2.0*    Assessment and Plan:  Cholecystectomy 12/23/22 with post-op bile leak. Biloma drain and peri-hepatic drains placed 12/27/22 by Dr. Elby Showers. Patient now s/p ERCP with stent placement 12/28/22.  Drain output has significantly decreased. Patient unsure of discharge home plans but thinks it might be Friday.   Drain Location: Both drains in the RUQ  Size: 8 and 10 Fr.  Date of placement: 12/27/22 Currently to: Bulb and gravity bag.  24 hour output:  Output by Drain (mL) 12/27/22 0700 - 12/27/22 1459 12/27/22 1500 - 12/27/22 2259 12/27/22 2300 - 12/28/22 0659 12/28/22 0700 - 12/28/22 1459 12/28/22 1500 - 12/28/22 2259 12/28/22 2300 - 12/29/22 0659 12/29/22 0700 - 12/29/22 1353  Closed System Drain  1 Medial RLQ 8 Fr.  135 300 100  10   Closed System Drain 2 Lateral RLQ 10 Fr.  720 40 10  20     Interval imaging/drain manipulation:  ERCP with stent placement 12/28/22  Current examination: Flushes/aspirates easily.  Insertion site unremarkable. Suture and stat lock in place. Dressed  appropriately.   Plan: Continue TID flushes with 5 cc NS. Record output Q shift. Dressing changes QD or PRN if soiled.  Call IR APP or on call IR MD if difficulty flushing or sudden change in drain output.  Repeat imaging/possible drain injection once output < 10 mL/QD (excluding flush material). Consideration for drain removal if output is < 10 mL/QD (excluding flush material), pending discussion with the providing surgical service.  Discharge planning: Outpatient orders placed. Patient will follow up at our outpatient clinic in 10-14 days for imaging/drain evaluation. Drain care teaching done with the patient at the bedside but patient's husband will need drain teaching by the RNs because the patient can't reach the drain sites. She knows the drains needs to be flushed once daily with 5 ml NS; change dressings daily or as needed and the sites need to stay clean and dry. She knows she can shower but the sites must be covered with an occlusive dressing.   IR will continue to follow - please call with questions or concerns.  Electronically Signed: Alwyn Ren, AGACNP-BC [phone] 12/29/2022, 1:49 PM   I spent a total of 15 Minutes at the the patient's bedside AND on the patient's hospital floor or unit, greater than 50% of which was counseling/coordinating care for biloma drain.

## 2022-12-29 NOTE — Progress Notes (Signed)
Progress Note   Subjective  Patient is feeling well today. Abdomen sore but pain in general is better. Drain output is decreasing. S/p ERCP yesterday. She is hungry and wants to eat.   Objective   Vital signs in last 24 hours: Temp:  [97.5 F (36.4 C)-98.4 F (36.9 C)] 98.3 F (36.8 C) (05/21 0732) Pulse Rate:  [73-101] 75 (05/21 0732) Resp:  [14-19] 15 (05/21 0732) BP: (127-172)/(48-80) 129/67 (05/21 0732) SpO2:  [91 %-98 %] 98 % (05/21 0732) Last BM Date : 12/25/22 General:    white female in NAD Abdomen:  Soft, mild tenderness in R abdomen, drains in place with small volume of output Neurologic:  Alert and oriented,  grossly normal neurologically. Psych:  Cooperative. Normal mood and affect.  Intake/Output from previous day: 05/20 0701 - 05/21 0700 In: 1501.9 [I.V.:1380.5; IV Piggyback:111.4] Out: 840 [Urine:700; Drains:140] Intake/Output this shift: No intake/output data recorded.  Lab Results: Recent Labs    12/27/22 0228 12/28/22 0124 12/29/22 0341  WBC 32.4* 21.7* 15.7*  HGB 11.6* 10.9* 9.4*  HCT 35.5* 33.1* 28.4*  PLT 221 182 190   BMET Recent Labs    12/27/22 0228 12/28/22 0124 12/29/22 0341  NA 129* 127* 131*  K 4.2 3.7 3.8  CL 96* 94* 99  CO2 24 26 26   GLUCOSE 179* 128* 119*  BUN 13 13 16   CREATININE 0.76 0.77 0.91  CALCIUM 9.0 8.8* 8.5*   LFT Recent Labs    12/29/22 0341  PROT 4.6*  ALBUMIN 2.0*  AST 13*  ALT 16  ALKPHOS 56  BILITOT 0.6   PT/INR Recent Labs    12/27/22 0742  LABPROT 17.7*  INR 1.4*    Studies/Results: DG ERCP  Result Date: 12/28/2022 CLINICAL DATA:  [id number] Surgery, elective [id number], post cholecystectomy, status post gallbladder fossa and perihepatic drain catheter placement EXAM: ERCP TECHNIQUE: Multiple spot images obtained with the fluoroscopic device and submitted for interpretation post-procedure. COMPARISON:  None Available. FINDINGS: A series of fluoroscopic spot images document endoscopic  cannulation and opacification of the CBD and cystic duct stump. No definite leak from the cystic duct stump but there is some extravasation at the inferior or lateral margin of the right lobe extending adjacent to the drain catheter. Intrahepatic biliary ducts appear decompressed. IMPRESSION: 1. No definite leak from the cystic duct stump. 2. Extravasation adjacent to the drain catheter, possibly from duct of Luschka or injury to small intrahepatic ducts. Electronically Signed   By: Corlis Leak M.D.   On: 12/28/2022 15:10   CT GUIDED VISCERAL FLUID DRAIN BY PERC CATH  Result Date: 12/28/2022 INDICATION: 77 year old female status post laparoscopic cholecystectomy complicated postoperatively by biloma presenting with sepsis. EXAM: CT IMAGE GUIDED DRAINAGE BY PERCUTANEOUS CATHETER x2 COMPARISON:  12/26/2022 MEDICATIONS: The patient is currently admitted to the hospital and receiving intravenous antibiotics. The antibiotics were administered within an appropriate time frame prior to the initiation of the procedure. ANESTHESIA/SEDATION: Moderate (conscious) sedation was employed during this procedure. A total of Versed 1.5 mg and Fentanyl 75 mcg was administered intravenously. Moderate Sedation Time: 16 minutes. The patient's level of consciousness and vital signs were monitored continuously by radiology nursing throughout the procedure under my direct supervision. CONTRAST:  None COMPLICATIONS: None immediate. PROCEDURE: RADIATION DOSE REDUCTION: This exam was performed according to the departmental dose-optimization program which includes automated exposure control, adjustment of the mA and/or kV according to patient size and/or use of iterative reconstruction technique. Informed written consent was obtained  from the patient after a discussion of the risks, benefits and alternatives to treatment. The patient was placed supine on the CT gantry and a pre procedural CT was performed re-demonstrating the known  abscess/fluid collection within the gallbladder fossa and right perihepatic region. The procedure was planned. A timeout was performed prior to the initiation of the procedure. The right upper quadrant was prepped and draped in the usual sterile fashion. The overlying soft tissues were anesthetized with 1% lidocaine with epinephrine. Appropriate trajectory was planned with the use of a 22 gauge spinal needle. An 18 gauge trocar needle was advanced into the abscess/fluid collection and a short Amplatz super stiff wire was coiled within the collection. Appropriate positioning was confirmed with a limited CT scan. The tract was serially dilated allowing placement of a 8 [place] skater drainage catheter. Appropriate positioning was confirmed with a limited postprocedural CT scan. Approximately 30 ml of bilious fluid was aspirated. The tube was connected to a bulb suction and sutured in place. Given inadequate drainage of perihepatic fluid collection after gallbladder fossa drain placement, additional drain placement was warranted. Just superior to the gallbladder fossa drain, the overlying soft tissues were anesthetized with 1% lidocaine with epinephrine. Appropriate trajectory was planned with the use of a 22 gauge spinal needle. An 18 gauge trocar needle was advanced into the abscess/fluid collection and a short Amplatz super stiff wire was coiled within the collection. Appropriate positioning was confirmed with a limited CT scan. The tract was serially dilated allowing placement of a 10 [place] multipurpose drainage catheter. Appropriate positioning was confirmed with a limited postprocedural CT scan. Approximately 1,000 ml of bilious fluid was aspirated. The tube was connected to a bag drainage and sutured in place. A fluid sample was sent to evaluate for culture as well as fluid bilirubin. A dressing was placed. The patient tolerated the procedure well without immediate post procedural complication. IMPRESSION: 1.  Successful CT guided placement of a 8 [place] all purpose drain catheter into the gallbladder fossa with aspiration of 30 mL of bilious fluid. Samples were sent to the laboratory as requested by the ordering clinical team. 2. Successful CT-guided placement of a 10 French all-purpose drain catheter into the perihepatic fluid collection with aspiration of approximately 1 L of bilious fluid. Marliss Coots, MD [facility] Electronically Signed   By: Marliss Coots M.D.   On: 12/28/2022 08:19   CT GUIDED VISCERAL FLUID DRAIN BY PERC CATH  Result Date: 12/28/2022 INDICATION: 77 year old female status post laparoscopic cholecystectomy complicated postoperatively by biloma presenting with sepsis. EXAM: CT IMAGE GUIDED DRAINAGE BY PERCUTANEOUS CATHETER x2 COMPARISON:  12/26/2022 MEDICATIONS: The patient is currently admitted to the hospital and receiving intravenous antibiotics. The antibiotics were administered within an appropriate time frame prior to the initiation of the procedure. ANESTHESIA/SEDATION: Moderate (conscious) sedation was employed during this procedure. A total of Versed 1.5 mg and Fentanyl 75 mcg was administered intravenously. Moderate Sedation Time: 16 minutes. The patient's level of consciousness and vital signs were monitored continuously by radiology nursing throughout the procedure under my direct supervision. CONTRAST:  None COMPLICATIONS: None immediate. PROCEDURE: RADIATION DOSE REDUCTION: This exam was performed according to the departmental dose-optimization program which includes automated exposure control, adjustment of the mA and/or kV according to patient size and/or use of iterative reconstruction technique. Informed written consent was obtained from the patient after a discussion of the risks, benefits and alternatives to treatment. The patient was placed supine on the CT gantry  and a pre procedural CT was performed  re-demonstrating the known abscess/fluid collection within the gallbladder fossa and right perihepatic region. The procedure was planned. A timeout was performed prior to the initiation of the procedure. The right upper quadrant was prepped and draped in the usual sterile fashion. The overlying soft tissues were anesthetized with 1% lidocaine with epinephrine. Appropriate trajectory was planned with the use of a 22 gauge spinal needle. An 18 gauge trocar needle was advanced into the abscess/fluid collection and a short Amplatz super stiff wire was coiled within the collection. Appropriate positioning was confirmed with a limited CT scan. The tract was serially dilated allowing placement of a 8 [place] skater drainage catheter. Appropriate positioning was confirmed with a limited postprocedural CT scan. Approximately 30 ml of bilious fluid was aspirated. The tube was connected to a bulb suction and sutured in place. Given inadequate drainage of perihepatic fluid collection after gallbladder fossa drain placement, additional drain placement was warranted. Just superior to the gallbladder fossa drain, the overlying soft tissues were anesthetized with 1% lidocaine with epinephrine. Appropriate trajectory was planned with the use of a 22 gauge spinal needle. An 18 gauge trocar needle was advanced into the abscess/fluid collection and a short Amplatz super stiff wire was coiled within the collection. Appropriate positioning was confirmed with a limited CT scan. The tract was serially dilated allowing placement of a 10 [place] multipurpose drainage catheter. Appropriate positioning was confirmed with a limited postprocedural CT scan. Approximately 1,000 ml of bilious fluid was aspirated. The tube was connected to a bag drainage and sutured in place. A fluid sample was sent to evaluate for culture as well as fluid bilirubin. A dressing was placed. The patient tolerated the procedure well without immediate post procedural  complication. IMPRESSION: 1. Successful CT guided placement of a 8 [place] all purpose drain catheter into the gallbladder fossa with aspiration of 30 mL of bilious fluid. Samples were sent to the laboratory as requested by the ordering clinical team. 2. Successful CT-guided placement of a 10 French all-purpose drain catheter into the perihepatic fluid collection with aspiration of approximately 1 L of bilious fluid. Marliss Coots, MD [facility] Radiology Electronically Signed   By: Marliss Coots M.D.   On: 12/28/2022 08:19       Assessment / Plan:    77 y/o female here with the following:  Bile leak post cholecystectomy Abnormal liver imaging - question of cirrhosis in the setting of long standing MTX use  She is status post ERCP with Dr. Leone Payor yesterday, stent placed to treat leak.  She is feeling much better today.  Her pain is improved, not resolved.  Drain output seems to be decreasing appropriately.  Her leukocytosis is significantly improved.  We discussed plan moving forward.  She will need an ERCP with stent removal if leak has healed, in roughly 2 months or so.  Dr. Leone Payor to coordinate.  Defer to IR in regards to how long they want to keep the drains in place, assuming a few more days.  Will touch base with them about this.  I spoke again with the patient's daughter, Dr. [name], about her course, and imaging suggesting possible cirrhosis.  If she does have cirrhosis this is a new diagnosis and she is compensated.  She does take methotrexate longstanding for her autoimmune diseases which has worked well for her, possible this could be the cause if she does have cirrhosis.  We discussed whether or not to  hold the methotrexate at this time, given this has provided significant benefit for her autoimmune diseases, will continue this for now but will need discussion with her rheumatologist and her primary GI in regards to long-term plan if she  does have cirrhosis.  She is next due for her Cimzia injection within the next week or so.  I think that would be reasonable to resume at that time as long as she continues to heal well from the bile leak.  Would continue antibiotics for a few more days.  Advancing diet today, as long as she continues to do well today, consider discharge home tomorrow.  All questions answered, they agree with the plan.  PLAN: - advance diet today - continue antibiotics for now - drain management per IR - will need ERCP in 2 months or so to reassess bile leak and pull stent if appropriate - possible cirrhosis noted on imaging - will need discussion with her Rheumatologist / primary GI as outpatient about long term management of her autoimmune diseases. Continuing methotrexate for now until that discussion, and okay to resume Cymzia next week as long as she continues to do well recovering from the bile leak  Call with questions, will follow peripherally for now.  Harlin Rain, MD [facility] Gastroenterology

## 2022-12-30 ENCOUNTER — Telehealth: Payer: Self-pay | Admitting: Internal Medicine

## 2022-12-30 DIAGNOSIS — K219 Gastro-esophageal reflux disease without esophagitis: Secondary | ICD-10-CM | POA: Diagnosis not present

## 2022-12-30 DIAGNOSIS — R932 Abnormal findings on diagnostic imaging of liver and biliary tract: Secondary | ICD-10-CM | POA: Diagnosis not present

## 2022-12-30 DIAGNOSIS — R1013 Epigastric pain: Secondary | ICD-10-CM

## 2022-12-30 DIAGNOSIS — Z9049 Acquired absence of other specified parts of digestive tract: Secondary | ICD-10-CM | POA: Diagnosis not present

## 2022-12-30 DIAGNOSIS — K9189 Other postprocedural complications and disorders of digestive system: Secondary | ICD-10-CM | POA: Diagnosis not present

## 2022-12-30 LAB — COMPREHENSIVE METABOLIC PANEL
ALT: 24 U/L (ref 0–44)
AST: 38 U/L (ref 15–41)
Albumin: 2.2 g/dL — ABNORMAL LOW (ref 3.5–5.0)
Alkaline Phosphatase: 138 U/L — ABNORMAL HIGH (ref 38–126)
Anion gap: 10 (ref 5–15)
BUN: 14 mg/dL (ref 8–23)
CO2: 23 mmol/L (ref 22–32)
Calcium: 8.7 mg/dL — ABNORMAL LOW (ref 8.9–10.3)
Chloride: 102 mmol/L (ref 98–111)
Creatinine, Ser: 0.83 mg/dL (ref 0.44–1.00)
GFR, Estimated: >60 mL/min (ref >60)
Glucose, Bld: 126 mg/dL — ABNORMAL HIGH (ref 70–99)
Potassium: 3.5 mmol/L (ref 3.5–5.1)
Sodium: 135 mmol/L (ref 135–145)
Total Bilirubin: 0.5 mg/dL (ref 0.3–1.2)
Total Protein: 5.4 g/dL — ABNORMAL LOW (ref 6.5–8.1)

## 2022-12-30 LAB — CBC WITH DIFFERENTIAL/PLATELET
Abs Immature Granulocytes: 0.11 10*3/uL — ABNORMAL HIGH (ref 0.00–0.07)
Basophils Absolute: 0 10*3/uL (ref 0.0–0.1)
Basophils Relative: 0 %
Eosinophils Absolute: 0.4 10*3/uL (ref 0.0–0.5)
Eosinophils Relative: 3 %
HCT: 31.8 % — ABNORMAL LOW (ref 36.0–46.0)
Hemoglobin: 10.3 g/dL — ABNORMAL LOW (ref 12.0–15.0)
Immature Granulocytes: 1 %
Lymphocytes Relative: 13 %
Lymphs Abs: 1.6 10*3/uL (ref 0.7–4.0)
MCH: 30.1 pg (ref 26.0–34.0)
MCHC: 32.4 g/dL (ref 30.0–36.0)
MCV: 93 fL (ref 80.0–100.0)
Monocytes Absolute: 0.8 10*3/uL (ref 0.1–1.0)
Monocytes Relative: 7 %
Neutro Abs: 9 10*3/uL — ABNORMAL HIGH (ref 1.7–7.7)
Neutrophils Relative %: 76 %
Platelets: 227 10*3/uL (ref 150–400)
RBC: 3.42 MIL/uL — ABNORMAL LOW (ref 3.87–5.11)
RDW: 17.2 % — ABNORMAL HIGH (ref 11.5–15.5)
WBC: 12 10*3/uL — ABNORMAL HIGH (ref 4.0–10.5)
nRBC: 0 % (ref 0.0–0.2)

## 2022-12-30 LAB — GLUCOSE, CAPILLARY
Glucose-Capillary: 106 mg/dL — ABNORMAL HIGH (ref 70–99)
Glucose-Capillary: 112 mg/dL — ABNORMAL HIGH (ref 70–99)
Glucose-Capillary: 73 mg/dL (ref 70–99)
Glucose-Capillary: 96 mg/dL (ref 70–99)

## 2022-12-30 LAB — LIPASE, BLOOD: Lipase: 59 U/L — ABNORMAL HIGH (ref 11–51)

## 2022-12-30 MED ORDER — SUCRALFATE 1 GM/10ML PO SUSP: 1.0000 g | Freq: Three times a day (TID) | ORAL | 0 refills | Status: DC

## 2022-12-30 MED ORDER — SODIUM CHLORIDE 0.9% FLUSH: 5.0000 mL | Freq: Every day | INTRAVENOUS | 0 refills | Status: DC

## 2022-12-30 MED ORDER — PANTOPRAZOLE SODIUM 40 MG PO TBEC
40.0000 mg | DELAYED_RELEASE_TABLET | Freq: Two times a day (BID) | ORAL | Status: DC
  Administered 2022-12-30: 40 mg via ORAL
  Filled 2022-12-30: qty 1

## 2022-12-30 MED ORDER — SUCRALFATE 1 GM/10ML PO SUSP: 1.0000 g | Freq: Three times a day (TID) | ORAL | Status: DC

## 2022-12-30 MED ORDER — ACETAMINOPHEN 500 MG PO TABS: 500.0000 mg | ORAL_TABLET | Freq: Four times a day (QID) | ORAL | Status: AC | PRN

## 2022-12-30 MED ORDER — ALUM & MAG HYDROXIDE-SIMETH 200-200-20 MG/5ML PO SUSP: 30.0000 mL | Freq: Four times a day (QID) | ORAL | Status: DC | PRN

## 2022-12-30 MED ORDER — OXYCODONE-ACETAMINOPHEN 10-325 MG PO TABS: 1.0000 | ORAL_TABLET | Freq: Four times a day (QID) | ORAL | 0 refills | Status: DC | PRN

## 2022-12-30 MED ORDER — AMOXICILLIN-POT CLAVULANATE 875-125 MG PO TABS: 1.0000 | ORAL_TABLET | Freq: Two times a day (BID) | ORAL | 0 refills | Status: AC

## 2022-12-30 NOTE — Care Management Important Message (Signed)
Important Message  Patient Details  Name: IVA BJORKMAN MRN: [id number] Date of Birth: 23-Apr-1946   Medicare Important Message Given:  Yes     Dorena Bodo 12/30/2022, 2:51 PM

## 2022-12-30 NOTE — Telephone Encounter (Signed)
Patient had stent for bile leak in hospital this week.  She needs an appointment to remove the stent with ERCP.  I would like to schedule her for July 15.  She is currently still hospitalized.  I suggest calling her daughter Dr. Tressia Danas and asking if that they would work and then making arrangements.  She would need diclofenac suppository and or order Unasyn on-call.  Please update me.

## 2022-12-30 NOTE — Progress Notes (Signed)
Progress Note   Subjective  Patient overall better than previous but having some upper abdominal soreness in the RUQ. Not severe but she is uncomfortable. Also having worsening reflux symptoms in recent days. Maalox helping. She takes omeprazole at home and has been on protonix once daily while here.   Objective   Vital signs in last 24 hours: Temp:  [97.8 F (36.6 C)-98.4 F (36.9 C)] 98.4 F (36.9 C) (05/22 0741) Pulse Rate:  [66-77] 75 (05/22 0741) Resp:  [15-16] 15 (05/22 0741) BP: (152-156)/(68-79) 154/74 (05/22 0741) SpO2:  [95 %-99 %] 98 % (05/22 0741) Last BM Date : 12/29/22 General:    white female in NAD Abdomen:  Soft, mild tenderness to RUQ palpation.  Neurologic:  Alert and oriented,  grossly normal neurologically. Psych:  Cooperative. Normal mood and affect.  Intake/Output from previous day: 05/21 0701 - 05/22 0700 In: 155 [IV Piggyback:135] Out: 850 [Urine:750; Drains:100] Intake/Output this shift: No intake/output data recorded.  Lab Results: Recent Labs    12/28/22 0124 12/29/22 0341  WBC 21.7* 15.7*  HGB 10.9* 9.4*  HCT 33.1* 28.4*  PLT 182 190   BMET Recent Labs    12/28/22 0124 12/29/22 0341  NA 127* 131*  K 3.7 3.8  CL 94* 99  CO2 26 26  GLUCOSE 128* 119*  BUN 13 16  CREATININE 0.77 0.91  CALCIUM 8.8* 8.5*   LFT Recent Labs    12/29/22 0341  PROT 4.6*  ALBUMIN 2.0*  AST 13*  ALT 16  ALKPHOS 56  BILITOT 0.6   PT/INR No results for input(s): "LABPROT", "INR" in the last 72 hours.  Studies/Results: DG ERCP  Result Date: 12/28/2022 CLINICAL DATA:  [id number] Surgery, elective [phone], post cholecystectomy, status post gallbladder fossa and perihepatic drain catheter placement EXAM: ERCP TECHNIQUE: Multiple spot images obtained with the fluoroscopic device and submitted for interpretation post-procedure. COMPARISON:  None Available. FINDINGS: A series of fluoroscopic spot images document endoscopic cannulation and opacification  of the CBD and cystic duct stump. No definite leak from the cystic duct stump but there is some extravasation at the inferior or lateral margin of the right lobe extending adjacent to the drain catheter. Intrahepatic biliary ducts appear decompressed. IMPRESSION: 1. No definite leak from the cystic duct stump. 2. Extravasation adjacent to the drain catheter, possibly from duct of Luschka or injury to small intrahepatic ducts. Electronically Signed   By: Corlis Leak M.D.   On: 12/28/2022 15:10       Assessment / Plan:    77 y/o female here with the following:  Bile leak post cholecystectomy Abnormal liver imaging - question of cirrhosis GERD  See prior notes for details. Some lingering RUQ soreness. I wonder if this is just from the drains / bile peritonitis and may take some time to resolve entirely, but will send labs to make sure no post ERCP pancreatitis, etc. She otherwise looks good today and her abdomen is benign on exam. If labs stable can discuss if she is ready to go home or if she wants to stay a bit longer.  As previously outlined she will need an ERCP in 2 months or so to determine if leak has healed and pull stent if so. Dr. Leone Payor will be following that up.   I have also spoken with her daughter, Dr. [name], about imaging suggesting cirrhosis which would be a new dx. Unclear if related to methotrexate. They are hesitant to stop the regimen for now  as it really helps her autoimmune disease. Will further workup as outpatient and have discussion with her rheumatologist as outpatient about plan. I discussed this with her primary GI Dr. Leonides Schanz, outpatient follow up has been scheduled.  Otherwise, GERD / dyspepsia worsening while inpatient in recent days. Will increase protonix to twice daily to see if that helps, continue Maalox. Consider carafate. She agrees.  PLAN: - labs today - lipase, CBC, CMET - if normal, consider discharge later today or tomorrow - continue diet - increase  protonix to twice daily dosing, Maalox PRN. Add carafate if needed - drain management per IR - to remain in place a few more days at least - ERCP in 2 months as outpatient to assess healing of leak and stent pull - discussion as outpatient about her regimen for her autoimmune disease with her rheumatologist in regards to methotrexate and imaging suggesting cirrhosis. Dr. Leonides Schanz to follow up this as outlined.  Call with questions.  Harlin Rain, MD [facility]

## 2022-12-30 NOTE — Discharge Summary (Signed)
[facility] Discharge Summary   Patient ID: [name] [name] MRN: [id number] DOB/AGE: 78-Jan-1947 32 y.o.  Admit date: 12/26/2022 Discharge date: 12/30/2022  Admitting Diagnosis: S/P laparoscopic cholecystectomy  Concern for post-operative bile leak   Discharge Diagnosis Post operative bile leak   Consultants Interventional radiology Gastroenterology   Imaging: DG ERCP  Result Date: 12/28/2022 CLINICAL DATA:  [id number] Surgery, elective [phone], post cholecystectomy, status post gallbladder fossa and perihepatic drain catheter placement EXAM: ERCP TECHNIQUE: Multiple spot images obtained with the fluoroscopic device and submitted for interpretation post-procedure. COMPARISON:  None Available. FINDINGS: A series of fluoroscopic spot images document endoscopic cannulation and opacification of the CBD and cystic duct stump. No definite leak from the cystic duct stump but there is some extravasation at the inferior or lateral margin of the right lobe extending adjacent to the drain catheter. Intrahepatic biliary ducts appear decompressed. IMPRESSION: 1. No definite leak from the cystic duct stump. 2. Extravasation adjacent to the drain catheter, possibly from duct of Luschka or injury to small intrahepatic ducts. Electronically Signed   By: Corlis Leak M.D.   On: 12/28/2022 15:10    Procedures Dr. Domingo Dimes Suttle (12/27/22) - Percutaneous drain placement  Dr. Stan Head (12/28/22) - ERCP with stent placement   Hospital Course:  Patient is a 77 year old female who presented to the ED 3 days s/p laparoscopic cholecystectomy with severe RUQ pain.  Workup showed perihepatic fluid and concern for bile leak. Interventional radiology and GI consulted. Patient was admitted and underwent procedures listed above.  Tolerated procedure well and was transferred to the floor.  Diet was advanced as tolerated.  On 12/30/22, the patient was voiding well, tolerating diet, ambulating well, pain well  controlled, vital signs stable, incisions c/d/i and felt stable for discharge home.  Patient will follow up in our office in 2 weeks and knows to call with questions or concerns.    Physical Exam: General:  Alert, NAD, pleasant, comfortable Abd:  Soft, ND, mild- moderate tenderness in RUQ, incisions C/D/I, drains with low volume bilious drainage    I or a member of my team have reviewed this patient in the Controlled Substance Database.   Allergies as of 12/30/2022       Reactions   Other Shortness Of Breath   Blue fish: palms and feet turn red   Thorazine [chlorpromazine] Other (See Comments)   Made her more more off balanced, vision   Bee Venom Swelling   Farxiga [dapagliflozin] Swelling   Gi upset   Semaglutide    Rybelsus - lip swelling, GI upset   Sulfa Antibiotics Hives        Medication List     TAKE these medications    acetaminophen 500 MG tablet Commonly known as: TYLENOL Take 1 tablet (500 mg total) by mouth every 6 (six) hours as needed for mild pain or fever.   alum & mag hydroxide-simeth 200-200-20 MG/5ML suspension Commonly known as: MAALOX/MYLANTA Take 30 mLs by mouth every 6 (six) hours as needed for indigestion or heartburn.   amoxicillin-clavulanate 875-125 MG tablet Commonly known as: AUGMENTIN Take 1 tablet by mouth 2 (two) times daily for 3 days.   carvedilol 6.25 MG tablet Commonly known as: COREG Take 6.25 mg by mouth 2 (two) times daily.   cyclobenzaprine 10 MG tablet Commonly known as: FLEXERIL TAKE 1 TABLET BY MOUTH AT BEDTIME   Lantus SoloStar 100 UNIT/ML Solostar Pen Generic drug: insulin glargine Inject 20 Units into the skin 2 (two)  times daily.   lisinopril 20 MG tablet Commonly known as: ZESTRIL Take 1 tablet (20 mg total) by mouth daily.   metFORMIN 500 MG 24 hr tablet Commonly known as: GLUCOPHAGE-XR TAKE 1 TABLET BY MOUTH TWICE A DAY   methotrexate 2.5 MG tablet Commonly known as: RHEUMATREX Take 20 mg by mouth every  Sunday at 6pm.   omeprazole 40 MG capsule Commonly known as: PRILOSEC Take 1 capsule (40 mg total) by mouth 2 (two) times daily.   oxyCODONE-acetaminophen 10-325 MG tablet Commonly known as: PERCOCET Take 1 tablet by mouth every 6 (six) hours as needed for pain. What changed: when to take this   potassium chloride 10 MEQ tablet Commonly known as: KLOR-CON M Take 2 tablets (20 mEq total) by mouth daily. What changed:  how much to take when to take this   rosuvastatin 5 MG tablet Commonly known as: CRESTOR TAKE 1 TABLET(5 MG) BY MOUTH DAILY What changed: See the new instructions.   sertraline 100 MG tablet Commonly known as: ZOLOFT Take 1.5 tablets (150 mg total) by mouth daily.   sodium chloride flush 0.9 % Soln Commonly known as: NS 5 mLs by Intracatheter route daily.   sucralfate 1 GM/10ML suspension Commonly known as: CARAFATE Take 10 mLs (1 g total) by mouth 4 (four) times daily -  with meals and at bedtime.           Follow-up Information     [facility] Follow up.   Why: Please follow up with our team in 10-14 days. A scheduler from our office will call you with a date/time of your appointment. Please call our office with any questions/concerns prior to your visit. Contact information: [address] [place] [address] [phone]         Gaynelle Adu, MD. Go on 01/12/2023.   Specialty: General Surgery Why: 2 PM, please arrive 30 min prior to appointment time. Contact information: [address] [place] [address] [phone]         Iva Boop, MD. Schedule an appointment as soon as possible for a visit in 2 month(s).   Specialty: Gastroenterology Why: For follow up ERCP Contact information: [address] [place] [address] [phone]                 Signed: Dorena Dew [facility] 12/30/2022, 12:44 PM Please see Amion for pager number during  day hours 7:00am-4:30pm

## 2022-12-30 NOTE — TOC Transition Note (Addendum)
Transition of Care [facility]) - CM/SW Discharge Note   Patient Details  Name: [name] [name] MRN: [id number] Date of Birth: 05-04-1946  [facility]) CM/SW Contact:  Harriet Masson, RN Phone Number: 12/30/2022, 12:50 PM   Clinical Narrative:      Patient stable for discharge.  Has all needed DME. Lives at [facility] and [facility].  Spoke to [place] [facility] and faxed home health PT order.  Patient has transportation home. Drain care instructions from IR.  Final next level of care: Home w Home Health Services Barriers to Discharge: Barriers Resolved   Patient Goals and CMS Choice CMS Medicare.gov Compare Post Acute Care list provided to:: Patient Choice offered to / list presented to : Patient  Discharge Placement               home          Discharge Plan and Services Additional resources added to the After Visit Summary for                            [facility] Arranged: PT HH Agency: Other - See comment ([facility]) Date HH Agency Contacted: 12/30/22 Time HH Agency Contacted: 1020 Representative spoke with at [facility] Agency: Chri  Social Determinants of Health (SDOH) Interventions SDOH Screenings   Food Insecurity: No Food Insecurity (12/26/2022)  Housing: Low Risk  (12/26/2022)  Transportation Needs: No Transportation Needs (12/26/2022)  Utilities: Not At Risk (12/26/2022)  Alcohol Screen: Low Risk  (10/01/2022)  Depression (PHQ2-9): Medium Risk (10/01/2022)  Financial Resource Strain: Low Risk  (10/01/2022)  Physical Activity: Insufficiently Active (10/01/2022)  Social Connections: Socially Integrated (10/01/2022)  Stress: No Stress Concern Present (10/01/2022)  Tobacco Use: Low Risk  (12/28/2022)     Readmission Risk Interventions    12/30/2022   10:22 AM  Readmission Risk Prevention Plan  Post Dischage Appt Complete  Transportation Screening Complete

## 2022-12-30 NOTE — Plan of Care (Signed)
Discharge instructions discussed with patient.  Patient instructed on home medications, restrictions, and follow up appointments. Belongings gathered and sent with patient.  Patients medications sent to [facility].  Patient discharged via wheelchair by this writer.   

## 2022-12-31 ENCOUNTER — Other Ambulatory Visit (HOSPITAL_COMMUNITY): Payer: Self-pay | Admitting: General Surgery

## 2022-12-31 ENCOUNTER — Other Ambulatory Visit: Payer: Self-pay

## 2022-12-31 ENCOUNTER — Telehealth: Payer: Self-pay | Admitting: *Deleted

## 2022-12-31 ENCOUNTER — Other Ambulatory Visit: Payer: Self-pay | Admitting: Internal Medicine

## 2022-12-31 ENCOUNTER — Encounter: Payer: Self-pay | Admitting: *Deleted

## 2022-12-31 DIAGNOSIS — Z4689 Encounter for fitting and adjustment of other specified devices: Secondary | ICD-10-CM

## 2022-12-31 DIAGNOSIS — K839 Disease of biliary tract, unspecified: Secondary | ICD-10-CM

## 2022-12-31 LAB — AEROBIC/ANAEROBIC CULTURE W GRAM STAIN (SURGICAL/DEEP WOUND)

## 2022-12-31 NOTE — Telephone Encounter (Signed)
Pts appt moved to 01/14/23 at 9:30am. Left message for pt to call back.

## 2022-12-31 NOTE — Telephone Encounter (Signed)
Spoke with patient's daughter, Dr. [name] & she asked that I discuss scheduling with patient. Patient confirmed date & is okay to move forward with scheduling. ERCP w/stent removal scheduled for 02/22/23 at 8:15 am at [facility] with Dr. Leone Payor. Orders placed & instructions sent to patient. She verbalized all understanding, and had no further questions at the end of the call.

## 2022-12-31 NOTE — Transitions of Care (Post Inpatient/ED Visit) (Signed)
   12/31/2022  Name: [name] [name] MRN: [id number] DOB: 05-Apr-1946  Today's TOC FU Call Status: Today's TOC FU Call Status:: Unsuccessul Call (1st Attempt) Unsuccessful Call (1st Attempt) Date: 12/31/22  Attempted to reach the patient regarding the most recent Inpatient visit; left HIPAA compliant voice message requesting call back  Follow Up Plan: Additional outreach attempts will be made to reach the patient to complete the Transitions of Care (Post Inpatient visit) call.   Caryl Pina, RN, BSN, CCRN Alumnus RN CM Care Coordination/ Transition of Care- [facility] Care Management [phone]: direct office

## 2022-12-31 NOTE — Telephone Encounter (Signed)
Patient called to return phone call. Advised her that her appointment is now on 6/6. Confirmed appointment.

## 2022-12-31 NOTE — Telephone Encounter (Signed)
Next visit w/ Dr. Leonides Schanz 6/28  Daughter asking about a sooner f/u  Alan Ripper are you able to see her in early June for a recheck?  Looks like you have some saved spots - I don't have any availability until July  Thanks  Baldo Ash

## 2023-01-01 ENCOUNTER — Encounter: Payer: Self-pay | Admitting: *Deleted

## 2023-01-01 ENCOUNTER — Telehealth: Payer: Self-pay | Admitting: *Deleted

## 2023-01-01 LAB — AEROBIC/ANAEROBIC CULTURE W GRAM STAIN (SURGICAL/DEEP WOUND)
Culture: NO GROWTH
Gram Stain: NONE SEEN

## 2023-01-01 NOTE — Transitions of Care (Post Inpatient/ED Visit) (Signed)
   01/01/2023  Name: [name] [name] MRN: [id number] DOB: [birth year]  Today's TOC FU Call Status: Today's TOC FU Call Status:: Unsuccessful Call (2nd Attempt) Unsuccessful Call (2nd Attempt) Date: 01/01/23  Attempted to reach the patient regarding the most recent Inpatient visit; left HIPAA compliant voice message requesting call back  Follow Up Plan: Additional outreach attempts will be made to reach the patient to complete the Transitions of Care (Post Inpatient visit) call.   Caryl Pina, RN, BSN, CCRN Alumnus RN CM Care Coordination/ Transition of Care- [facility] Care Management [phone]: direct office

## 2023-01-04 ENCOUNTER — Emergency Department (HOSPITAL_COMMUNITY): Payer: Medicare Other

## 2023-01-04 ENCOUNTER — Encounter (HOSPITAL_COMMUNITY): Payer: Self-pay

## 2023-01-04 ENCOUNTER — Emergency Department (HOSPITAL_COMMUNITY)
Admission: EM | Admit: 2023-01-04 | Discharge: 2023-01-05 | Disposition: A | Discharge status: Home / Self Care | Payer: Medicare Other | Attending: Emergency Medicine | Admitting: Emergency Medicine

## 2023-01-04 ENCOUNTER — Other Ambulatory Visit: Payer: Self-pay

## 2023-01-04 DIAGNOSIS — T148XXA Other injury of unspecified body region, initial encounter: Secondary | ICD-10-CM

## 2023-01-04 DIAGNOSIS — R1011 Right upper quadrant pain: Secondary | ICD-10-CM | POA: Diagnosis not present

## 2023-01-04 DIAGNOSIS — Z4803 Encounter for change or removal of drains: Secondary | ICD-10-CM | POA: Diagnosis not present

## 2023-01-04 DIAGNOSIS — Z4689 Encounter for fitting and adjustment of other specified devices: Secondary | ICD-10-CM | POA: Diagnosis not present

## 2023-01-04 DIAGNOSIS — G8918 Other acute postprocedural pain: Secondary | ICD-10-CM | POA: Diagnosis not present

## 2023-01-04 DIAGNOSIS — Z9049 Acquired absence of other specified parts of digestive tract: Secondary | ICD-10-CM | POA: Diagnosis not present

## 2023-01-04 DIAGNOSIS — Y69 Unspecified misadventure during surgical and medical care: Secondary | ICD-10-CM | POA: Diagnosis not present

## 2023-01-04 DIAGNOSIS — I700 Atherosclerosis of aorta: Secondary | ICD-10-CM | POA: Diagnosis not present

## 2023-01-04 DIAGNOSIS — J90 Pleural effusion, not elsewhere classified: Secondary | ICD-10-CM | POA: Diagnosis not present

## 2023-01-04 DIAGNOSIS — R1031 Right lower quadrant pain: Secondary | ICD-10-CM | POA: Diagnosis not present

## 2023-01-04 DIAGNOSIS — T8189XA Other complications of procedures, not elsewhere classified, initial encounter: Secondary | ICD-10-CM | POA: Diagnosis not present

## 2023-01-04 DIAGNOSIS — R109 Unspecified abdominal pain: Secondary | ICD-10-CM | POA: Diagnosis not present

## 2023-01-04 LAB — COMPREHENSIVE METABOLIC PANEL
ALT: 12 U/L (ref 0–44)
AST: 21 U/L (ref 15–41)
Albumin: 2.7 g/dL — ABNORMAL LOW (ref 3.5–5.0)
Alkaline Phosphatase: 68 U/L (ref 38–126)
Anion gap: 9 (ref 5–15)
BUN: 10 mg/dL (ref 8–23)
CO2: 26 mmol/L (ref 22–32)
Calcium: 8.9 mg/dL (ref 8.9–10.3)
Chloride: 100 mmol/L (ref 98–111)
Creatinine, Ser: 0.85 mg/dL (ref 0.44–1.00)
GFR, Estimated: >60 mL/min (ref >60)
Glucose, Bld: 114 mg/dL — ABNORMAL HIGH (ref 70–99)
Potassium: 4.7 mmol/L (ref 3.5–5.1)
Sodium: 135 mmol/L (ref 135–145)
Total Bilirubin: 0.3 mg/dL (ref 0.3–1.2)
Total Protein: 6 g/dL — ABNORMAL LOW (ref 6.5–8.1)

## 2023-01-04 LAB — CBC WITH DIFFERENTIAL/PLATELET
Abs Immature Granulocytes: 0.13 10*3/uL — ABNORMAL HIGH (ref 0.00–0.07)
Basophils Absolute: 0 10*3/uL (ref 0.0–0.1)
Basophils Relative: 0 %
Eosinophils Absolute: 0.2 10*3/uL (ref 0.0–0.5)
Eosinophils Relative: 2 %
HCT: 31.9 % — ABNORMAL LOW (ref 36.0–46.0)
Hemoglobin: 10.1 g/dL — ABNORMAL LOW (ref 12.0–15.0)
Immature Granulocytes: 1 %
Lymphocytes Relative: 16 %
Lymphs Abs: 1.8 10*3/uL (ref 0.7–4.0)
MCH: 29.9 pg (ref 26.0–34.0)
MCHC: 31.7 g/dL (ref 30.0–36.0)
MCV: 94.4 fL (ref 80.0–100.0)
Monocytes Absolute: 0.6 10*3/uL (ref 0.1–1.0)
Monocytes Relative: 5 %
Neutro Abs: 8.8 10*3/uL — ABNORMAL HIGH (ref 1.7–7.7)
Neutrophils Relative %: 76 %
Platelets: 261 10*3/uL (ref 150–400)
RBC: 3.38 MIL/uL — ABNORMAL LOW (ref 3.87–5.11)
RDW: 16.7 % — ABNORMAL HIGH (ref 11.5–15.5)
WBC: 11.5 10*3/uL — ABNORMAL HIGH (ref 4.0–10.5)
nRBC: 0 % (ref 0.0–0.2)

## 2023-01-04 LAB — LIPASE, BLOOD: Lipase: 22 U/L (ref 11–51)

## 2023-01-04 MED ORDER — FENTANYL CITRATE PF 50 MCG/ML IJ SOSY
50.0000 ug | PREFILLED_SYRINGE | Freq: Once | INTRAMUSCULAR | Status: AC
  Administered 2023-01-04: 50 ug via INTRAVENOUS
  Filled 2023-01-04: qty 1

## 2023-01-04 MED ORDER — IOHEXOL 300 MG/ML  SOLN
100.0000 mL | Freq: Once | INTRAMUSCULAR | Status: AC | PRN
  Administered 2023-01-04: 100 mL via INTRAVENOUS

## 2023-01-04 MED ORDER — SODIUM CHLORIDE (PF) 0.9 % IJ SOLN
INTRAMUSCULAR | Status: AC
  Filled 2023-01-04: qty 50

## 2023-01-04 MED ORDER — ONDANSETRON HCL 4 MG/2ML IJ SOLN
4.0000 mg | Freq: Once | INTRAMUSCULAR | Status: AC
  Administered 2023-01-04: 4 mg via INTRAVENOUS
  Filled 2023-01-04: qty 2

## 2023-01-04 MED ORDER — SODIUM CHLORIDE 0.9 % IV BOLUS
1000.0000 mL | Freq: Once | INTRAVENOUS | Status: AC
  Administered 2023-01-04: 1000 mL via INTRAVENOUS

## 2023-01-04 NOTE — Discharge Instructions (Signed)
Your drainage tubes are in the correct position.  They have stopped draining because there is no fluid left to drain.  Call Dr. Andrey Campanile in the morning to arrange to have these tubes removed if he agrees.  Return to the ED with worsening pain, fever, vomiting, any other concerns.

## 2023-01-04 NOTE — ED Provider Notes (Signed)
[place] EMERGENCY DEPARTMENT AT [facility] Provider Note   CSN: [id number] Arrival date & time: 01/04/23  1927     History {Add pertinent medical, surgical, social history, OB history to HPI:1} Chief Complaint  Patient presents with   Post-op Problem    AKESHIA MINCEY is a 77 y.o. female.  Patient underwent cholecystectomy on May 15.  She returned on May 18 with a bile leak and had a ERCP with stent placement and biliary drain placed.  She is here today with concern for abrupt stoppage of drainage from both of her drainage bags.  States she has been having 50 cc of clear yellow fluid in each bag per day but today it is only been 3 cc in each bag.  Her daughter who is a gastroenterologist was concerned that there is either an infection or the tubes are displaced.  She states there has been some increased pain to her right upper quadrant but has not had any oxycodone today.  Nausea but no vomiting.  No fever.  Her pain is constant and unchanged since her discharge in May 22.  She continues to have loose stools that are frequent.  Denies any black or bloody stools. They are concerned because the drainage stopped abruptly and are wondering if the tubes are displaced  The history is provided by the patient and the spouse.       Home Medications Prior to Admission medications   Medication Sig Start Date End Date Taking? Authorizing Provider  acetaminophen (TYLENOL) 500 MG tablet Take 1 tablet (500 mg total) by mouth every 6 (six) hours as needed for mild pain or fever. 12/30/22   Juliet Rude, PA-C  alum & mag hydroxide-simeth (MAALOX/MYLANTA) 200-200-20 MG/5ML suspension Take 30 mLs by mouth every 6 (six) hours as needed for indigestion or heartburn. 12/30/22   Juliet Rude, PA-C  carvedilol (COREG) 6.25 MG tablet Take 6.25 mg by mouth 2 (two) times daily. 12/26/22   [provider]  cyclobenzaprine (FLEXERIL) 10 MG tablet TAKE 1 TABLET BY MOUTH AT  BEDTIME Patient taking differently: Take 10 mg by mouth at bedtime. 09/07/22   Pincus Sanes, MD  insulin glargine (LANTUS SOLOSTAR) 100 UNIT/ML Solostar Pen Inject 20 Units into the skin 2 (two) times daily. 10/14/22   Pincus Sanes, MD  lisinopril (ZESTRIL) 20 MG tablet Take 1 tablet (20 mg total) by mouth daily. 10/20/21   Lennette Bihari, MD  metFORMIN (GLUCOPHAGE-XR) 500 MG 24 hr tablet TAKE 1 TABLET BY MOUTH TWICE A DAY 07/24/22   Pincus Sanes, MD  methotrexate (RHEUMATREX) 2.5 MG tablet Take 20 mg by mouth every Sunday at 6pm. 06/24/16   [provider]  omeprazole (PRILOSEC) 40 MG capsule TAKE 1 CAPSULE BY MOUTH TWICE A DAY 01/01/23   Imogene Burn, MD  oxyCODONE-acetaminophen (PERCOCET) 10-325 MG tablet Take 1 tablet by mouth every 6 (six) hours as needed for pain. 12/30/22   Juliet Rude, PA-C  potassium chloride (KLOR-CON M) 10 MEQ tablet Take 2 tablets (20 mEq total) by mouth daily. Patient taking differently: Take 10 mEq by mouth 2 (two) times daily. 09/18/22   Pincus Sanes, MD  rosuvastatin (CRESTOR) 5 MG tablet TAKE 1 TABLET(5 MG) BY MOUTH DAILY Patient taking differently: Take 5 mg by mouth every evening. 03/19/22   Pincus Sanes, MD  sertraline (ZOLOFT) 100 MG tablet Take 1.5 tablets (150 mg total) by mouth daily. 09/21/22   Cheryll Cockayne  J, MD  sodium chloride flush (NS) 0.9 % SOLN 5 mLs by Intracatheter route daily. 12/30/22   Juliet Rude, PA-C  sucralfate (CARAFATE) 1 GM/10ML suspension Take 10 mLs (1 g total) by mouth 4 (four) times daily -  with meals and at bedtime. 12/30/22   Juliet Rude, PA-C      Allergies    Other, Thorazine [chlorpromazine], Bee venom, Farxiga [dapagliflozin], Semaglutide, and Sulfa antibiotics    Review of Systems   Review of Systems  Constitutional:  Negative for activity change, appetite change and fever.  HENT:  Negative for congestion and rhinorrhea.   Respiratory:  Negative for cough, chest tightness and shortness of breath.    Cardiovascular:  Negative for chest pain.  Gastrointestinal:  Positive for abdominal pain and nausea. Negative for vomiting.  Genitourinary:  Negative for dysuria.  Musculoskeletal:  Negative for arthralgias and myalgias.  Skin:  Negative for rash.  Neurological:  Negative for dizziness, weakness and headaches.   all other systems are negative except as noted in the HPI and PMH.    Physical Exam Updated Vital Signs BP (!) 151/69 (BP Location: Right Arm)   Pulse 75   Temp 98.7 F (37.1 C) (Oral)   Resp 17   Ht 5\' 6"  (1.676 m)   Wt 87.5 kg   SpO2 94%   BMI 31.15 kg/m  Physical Exam Vitals and nursing note reviewed.  Constitutional:      General: She is not in acute distress.    Appearance: Normal appearance. She is well-developed and normal weight. She is not ill-appearing.  HENT:     Head: Normocephalic and atraumatic.     Mouth/Throat:     Pharynx: No oropharyngeal exudate.  Eyes:     Conjunctiva/sclera: Conjunctivae normal.     Pupils: Pupils are equal, round, and reactive to light.  Neck:     Comments: No meningismus. Cardiovascular:     Rate and Rhythm: Normal rate and regular rhythm.     Heart sounds: Normal heart sounds. No murmur heard. Pulmonary:     Effort: Pulmonary effort is normal. No respiratory distress.     Breath sounds: Normal breath sounds.  Abdominal:     Palpations: Abdomen is soft.     Tenderness: There is abdominal tenderness. There is guarding. There is no rebound.     Comments: Biliary drain and JP drain are in place.  It appears that JP drain is slightly displaced from the skin.  There is surrounding erythema of the skin but no drainage from the skin. Abdomen is soft with tenderness in the right upper quadrant.  Musculoskeletal:        General: No tenderness. Normal range of motion.     Cervical back: Normal range of motion and neck supple.  Skin:    General: Skin is warm.  Neurological:     Mental Status: She is alert and oriented to  person, place, and time.     Cranial Nerves: No cranial nerve deficit.     Motor: No abnormal muscle tone.     Coordination: Coordination normal.     Comments:  5/5 strength throughout. CN 2-12 intact.Equal grip strength.   Psychiatric:        Behavior: Behavior normal.     ED Results / Procedures / Treatments   Labs (all labs ordered are listed, but only abnormal results are displayed) Labs Reviewed  CBC WITH DIFFERENTIAL/PLATELET - Abnormal; Notable for the following components:  Result Value   WBC 11.5 (*)    RBC 3.38 (*)    Hemoglobin 10.1 (*)    HCT 31.9 (*)    RDW 16.7 (*)    Neutro Abs 8.8 (*)    Abs Immature Granulocytes 0.13 (*)    All other components within normal limits  COMPREHENSIVE METABOLIC PANEL - Abnormal; Notable for the following components:   Glucose, Bld 114 (*)    Total Protein 6.0 (*)    Albumin 2.7 (*)    All other components within normal limits  LIPASE, BLOOD    EKG None  Radiology CT ABDOMEN PELVIS W CONTRAST  Result Date: 01/04/2023 CLINICAL DATA:  Abdominal pain.  Bile leak post cholecystectomy. EXAM: CT ABDOMEN AND PELVIS WITH CONTRAST TECHNIQUE: Multidetector CT imaging of the abdomen and pelvis was performed using the standard protocol following bolus administration of intravenous contrast. RADIATION DOSE REDUCTION: This exam was performed according to the departmental dose-optimization program which includes automated exposure control, adjustment of the mA and/or kV according to patient size and/or use of iterative reconstruction technique. CONTRAST:  OMNIPAQUE IOHEXOL 300 MG/ML  SOLN COMPARISON:  Dec 26, 2022 FINDINGS: Lower chest: Small right pleural effusion. Ground-glass opacities within lingula. Subtle ground-glass opacities in the left lower lobe. Hepatobiliary: Post cholecystectomy. Biliary drain has been placed. Diminished perihepatic ascites. Percutaneous draining catheters are seen in the gallbladder fossa and along the liver  capsule. Pancreas: Unremarkable. No pancreatic ductal dilatation or surrounding inflammatory changes. Spleen: Normal in size without focal abnormality. Adrenals/Urinary Tract: Adrenal glands are unremarkable. Kidneys are normal, without renal calculi, focal lesion, or hydronephrosis. Bladder is unremarkable. Stomach/Bowel: Stomach is within normal limits. No evidence of bowel wall thickening, distention, or inflammatory changes. Vascular/Lymphatic: Aortic atherosclerosis. No enlarged abdominal or pelvic lymph nodes. Reproductive: Negative. Other: No abdominal wall hernia. Expected postsurgical changes. No abdominopelvic ascites. Musculoskeletal: Right hip arthroplasty. Chronic T12 compression fracture. Osteoarthritic changes of the spine. IMPRESSION: 1. Post cholecystectomy. Biliary drain has been placed. Percutaneous draining catheters are seen in the gallbladder fossa and along the liver capsule. Diminished perihepatic ascites. 2. Small right pleural effusion. 3. Ground-glass opacities within the lingula and left lower lobe may represent atelectasis or pneumonia. 4. Aortic atherosclerosis. Aortic Atherosclerosis (ICD10-I70.0). Electronically Signed   By: Ted Mcalpine M.D.   On: 01/04/2023 23:05    Procedures Procedures  {Document cardiac monitor, telemetry assessment procedure when appropriate:1}  Medications Ordered in ED Medications  sodium chloride 0.9 % bolus 1,000 mL (has no administration in time range)  ondansetron (ZOFRAN) injection 4 mg (has no administration in time range)  fentaNYL (SUBLIMAZE) injection 50 mcg (has no administration in time range)    ED Course/ Medical Decision Making/ A&P   {   Click here for ABCD2, HEART and other calculatorsREFRESH Note before signing :1}                          Medical Decision Making Amount and/or Complexity of Data Reviewed Labs: ordered. Decision-making details documented in ED Course. Radiology: ordered and independent interpretation  performed. Decision-making details documented in ED Course. ECG/medicine tests: ordered and independent interpretation performed. Decision-making details documented in ED Course.  Risk Prescription drug management.   Patient with recent cholecystectomy complicated by bile leak here with decreased output from her JP drains and biliary drains.  Vital stable, no distress, abdomen soft without peritoneal signs  Patient given IV fluids, pain and nausea medications.  Labs will be obtained.  LFTs are normal.  Lipase is normal.  No leukocytosis.  CT is assessed for position of drains.  Patient concerned that output from JP and biliary drains dropped abruptly today.  CT scan shows drains are in appropriate position.  Minimal perihepatic ascites.  Discussed with Dr. Magnus Ivan of general surgery.  He agrees that is reassuring that drainage has subsided as it means that the fluid collection is mostly gone.  Labs are reassuring.  Patient can follow-up with general surgery and call office in the morning {Document critical care time when appropriate:1} {Document review of labs and clinical decision tools ie heart score, Chads2Vasc2 etc:1}  {Document your independent review of radiology images, and any outside records:1} {Document your discussion with family members, caretakers, and with consultants:1} {Document social determinants of health affecting pt's care:1} {Document your decision making why or why not admission, treatments were needed:1} Final Clinical Impression(s) / ED Diagnoses Final diagnoses:  None    Rx / DC Orders ED Discharge Orders     None

## 2023-01-04 NOTE — ED Triage Notes (Addendum)
Patient arrives from home. States she had gall bladder surgery on 12/23/22 here at [facility] by Dr Andrey Campanile. Patient returned for a 2nd surgery on 12/26/2022 due to leakage from the surgery site, with stint and drain placement by Dr Clydene Pugh. Patients drain was placed on the right side. Patient states today it has stopped draining and she now has pain. Patient rates pain 6/10.

## 2023-01-05 ENCOUNTER — Telehealth: Payer: Self-pay | Admitting: *Deleted

## 2023-01-05 ENCOUNTER — Encounter: Payer: Self-pay | Admitting: *Deleted

## 2023-01-05 ENCOUNTER — Telehealth: Payer: Self-pay | Admitting: Internal Medicine

## 2023-01-05 DIAGNOSIS — J90 Pleural effusion, not elsewhere classified: Secondary | ICD-10-CM | POA: Diagnosis not present

## 2023-01-05 NOTE — Telephone Encounter (Signed)
Pt had questions about her recent ERCP. Pt was notified that Dr. Leone Payor is out of the office this week and that she has an appointment to see Dr. Leonides Schanz on 01/14/2023 at 9:30 AM where all of her questions can be answered. Pt verbalized understanding with all questions answered.

## 2023-01-05 NOTE — Telephone Encounter (Signed)
Patient called requested to speak with a nurse regarding ERCP

## 2023-01-05 NOTE — Transitions of Care (Post Inpatient/ED Visit) (Signed)
   01/05/2023  Name: [name] [name] MRN: [id number] DOB: [birth year]  Today's TOC FU Call Status: Today's TOC FU Call Status:: Unsuccessful Call (3rd Attempt) Unsuccessful Call (3rd Attempt) Date: 01/05/23  Attempted to reach the patient regarding the most recent Inpatient visit; left HIPAA compliant voice message requesting call back  Follow Up Plan: No further outreach attempts will be made at this time. We have been unable to contact the patient.  Caryl Pina, RN, BSN, CCRN Alumnus RN CM Care Coordination/ Transition of Care- [facility] Care Management [phone]: direct office

## 2023-01-06 ENCOUNTER — Other Ambulatory Visit: Payer: Self-pay | Admitting: General Surgery

## 2023-01-06 ENCOUNTER — Ambulatory Visit
Admission: RE | Admit: 2023-01-06 | Discharge: 2023-01-06 | Disposition: A | Discharge status: Home / Self Care | Payer: Medicare Other | Source: Ambulatory Visit | Attending: Student | Admitting: Student

## 2023-01-06 ENCOUNTER — Ambulatory Visit
Admission: RE | Admit: 2023-01-06 | Discharge: 2023-01-06 | Disposition: A | Discharge status: Home / Self Care | Payer: Medicare Other | Source: Ambulatory Visit | Attending: General Surgery | Admitting: General Surgery

## 2023-01-06 DIAGNOSIS — K839 Disease of biliary tract, unspecified: Secondary | ICD-10-CM

## 2023-01-06 DIAGNOSIS — L4059 Other psoriatic arthropathy: Secondary | ICD-10-CM | POA: Diagnosis not present

## 2023-01-06 DIAGNOSIS — K9189 Other postprocedural complications and disorders of digestive system: Secondary | ICD-10-CM | POA: Diagnosis not present

## 2023-01-06 MED ORDER — IOPAMIDOL (ISOVUE-300) INJECTION 61%
25.0000 mL | Freq: Once | INTRAVENOUS | Status: AC
  Administered 2023-01-06: 25 mL

## 2023-01-06 NOTE — Progress Notes (Signed)
Referring Physician(s): Dr. Dwain Sarna  Chief Complaint: The patient is seen in follow up today s/p gallbladder fossa biloma drain and perihepatic fluid collection drain.   History of present illness:  [name] [name], 77 year old female, has a medical history significant for DM, HTN, depression/anxiety, DVT and laparoscopic cholecystectomy 12/23/22 for symptomatic cholelithiasis. Her surgery was uneventful and she was discharged home post-operatively. She presented to the ED 12/26/22 with complaints of severe abdominal pain and nausea. Work up was notable for leukocytosis and imaging showed fluid in the gallbladder fossa with perihepatic fluid.    She was admitted and her WBC increased significantly overnight. MR liver showed a post-operative bile leak with bilomas in the gallbladder fossa and overlying the central aspect of the liver. IR was consulted and on 12/27/22 Dr. Elby Showers placed a gallbladder fossa biloma drain and a peri-hepatic fluid collection drain.   She underwent ERCP with stent placement 12/28/22 and she was discharged from the hospital 12/30/22. She was evaluated in the ED 01/04/23 due to concerns of low output from the drains. Labs were within normal limits and CT imaging showed the drains to be appropriately placed.   The patient now presents to the [facility] outpatient clinic for drain evaluations with drain injections. She is accompanied today by her significant other. She reports minimal output from the drains. She has been afebrile and denies pain, discomfort or nausea. She has occasional diarrhea. She endorses a good appetite but is weak and easily fatigued. She is not taking any antibiotics.   Past Medical History:  Diagnosis Date   Anemia    Anxiety    Arthritis    Closed fracture of fifth metatarsal bone 05/13/2018   Deep vein thrombosis (HCC)    right calf - 05/2012    Depression    Diabetes mellitus without complication (HCC)    diet controlled    GERD  (gastroesophageal reflux disease)    Hyperlipidemia    Patient denies.   Hypertension    Ejection fraction =>55% Left ventricular systolic function is normal. Left ventricular wall motion is normal     Lymphocytic colitis    Neuropathy    diabetic - in bilateral feet    Pityriasis lichenoides chronica    Pneumonia    Sleep apnea    bipap    Past Surgical History:  Procedure Laterality Date   BILIARY STENT PLACEMENT  12/28/2022   Procedure: BILIARY STENT PLACEMENT;  Surgeon: Iva Boop, MD;  Location: [facility] ENDOSCOPY;  Service: Gastroenterology;;   BIOPSY  11/09/2022   Procedure: BIOPSY;  Surgeon: Lemar Lofty., MD;  Location: [facility] ENDOSCOPY;  Service: Gastroenterology;;   CHOLECYSTECTOMY N/A 12/23/2022   Procedure: LAPAROSCOPIC CHOLECYSTECTOMY WITH ICG DYE;  Surgeon: Gaynelle Adu, MD;  Location: [facility] ORS;  Service: General;  Laterality: N/A;   DILATION AND CURETTAGE OF UTERUS     ENDOSCOPIC RETROGRADE CHOLANGIOPANCREATOGRAPHY (ERCP) WITH PROPOFOL N/A 12/28/2022   Procedure: ENDOSCOPIC RETROGRADE CHOLANGIOPANCREATOGRAPHY (ERCP) WITH PROPOFOL;  Surgeon: Iva Boop, MD;  Location: [facility] ENDOSCOPY;  Service: Gastroenterology;  Laterality: N/A;   ESOPHAGOGASTRODUODENOSCOPY N/A 11/09/2022   Procedure: ESOPHAGOGASTRODUODENOSCOPY (EGD);  Surgeon: Lemar Lofty., MD;  Location: [facility] ENDOSCOPY;  Service: Gastroenterology;  Laterality: N/A;   EYE SURGERY  07/2020   HAMMER TOE SURGERY     right hand surgery      due to blood infection    torn meniscus repair      right knee    TOTAL HIP ARTHROPLASTY  09/13/2012  Procedure: TOTAL HIP ARTHROPLASTY ANTERIOR APPROACH;  Surgeon: Shelda Pal, MD;  Location: [facility] ORS;  Service: Orthopedics;  Laterality: Right;   UPPER ESOPHAGEAL ENDOSCOPIC ULTRASOUND (EUS) N/A 11/09/2022   Procedure: UPPER ESOPHAGEAL ENDOSCOPIC ULTRASOUND (EUS);  Surgeon: Lemar Lofty., MD;  Location: [facility] ENDOSCOPY;  Service: Gastroenterology;  Laterality: N/A;     Allergies: Other, Thorazine [chlorpromazine], Bee venom, Farxiga [dapagliflozin], Semaglutide, and Sulfa antibiotics  Medications: Prior to Admission medications   Medication Sig Start Date End Date Taking? Authorizing Provider  acetaminophen (TYLENOL) 500 MG tablet Take 1 tablet (500 mg total) by mouth every 6 (six) hours as needed for mild pain or fever. 12/30/22   Juliet Rude, PA-C  alum & mag hydroxide-simeth (MAALOX/MYLANTA) 200-200-20 MG/5ML suspension Take 30 mLs by mouth every 6 (six) hours as needed for indigestion or heartburn. 12/30/22   Juliet Rude, PA-C  carvedilol (COREG) 6.25 MG tablet Take 6.25 mg by mouth 2 (two) times daily. 12/26/22   [provider]  cyclobenzaprine (FLEXERIL) 10 MG tablet TAKE 1 TABLET BY MOUTH AT BEDTIME Patient taking differently: Take 10 mg by mouth at bedtime. 09/07/22   Pincus Sanes, MD  insulin glargine (LANTUS SOLOSTAR) 100 UNIT/ML Solostar Pen Inject 20 Units into the skin 2 (two) times daily. 10/14/22   Pincus Sanes, MD  lisinopril (ZESTRIL) 20 MG tablet Take 1 tablet (20 mg total) by mouth daily. 10/20/21   Lennette Bihari, MD  metFORMIN (GLUCOPHAGE-XR) 500 MG 24 hr tablet TAKE 1 TABLET BY MOUTH TWICE A DAY 07/24/22   Pincus Sanes, MD  methotrexate (RHEUMATREX) 2.5 MG tablet Take 20 mg by mouth every Sunday at 6pm. 06/24/16   [provider]  omeprazole (PRILOSEC) 40 MG capsule TAKE 1 CAPSULE BY MOUTH TWICE A DAY 01/01/23   Imogene Burn, MD  oxyCODONE-acetaminophen (PERCOCET) 10-325 MG tablet Take 1 tablet by mouth every 6 (six) hours as needed for pain. 12/30/22   Juliet Rude, PA-C  potassium chloride (KLOR-CON M) 10 MEQ tablet Take 2 tablets (20 mEq total) by mouth daily. Patient taking differently: Take 10 mEq by mouth 2 (two) times daily. 09/18/22   Pincus Sanes, MD  rosuvastatin (CRESTOR) 5 MG tablet TAKE 1 TABLET(5 MG) BY MOUTH DAILY Patient taking differently: Take 5 mg by mouth every evening. 03/19/22    Pincus Sanes, MD  sertraline (ZOLOFT) 100 MG tablet Take 1.5 tablets (150 mg total) by mouth daily. 09/21/22   Pincus Sanes, MD  sodium chloride flush (NS) 0.9 % SOLN 5 mLs by Intracatheter route daily. 12/30/22   Juliet Rude, PA-C  sucralfate (CARAFATE) 1 GM/10ML suspension Take 10 mLs (1 g total) by mouth 4 (four) times daily -  with meals and at bedtime. 12/30/22   Juliet Rude, PA-C     Family History  Problem Relation Age of Onset   Hypertension Mother     Social History   Socioeconomic History   Marital status: Divorced    Spouse name: Not on file   Number of children: Not on file   Years of education: Not on file   Highest education level: Not on file  Occupational History   Not on file  Tobacco Use   Smoking status: Never   Smokeless tobacco: Never  Substance and Sexual Activity   Alcohol use: Yes    Comment: occasional wine    Drug use: No   Sexual activity: Not on file  Other Topics Concern  Not on file  Social History Narrative   She is widowed. She has 2 children, 2 grandchildren. She does drink occasional alcohol . There is no tobacco . She does not have a routine exercise program   Social Determinants of Health   Financial Resource Strain: Low Risk  (10/01/2022)   Overall Financial Resource Strain (CARDIA)    Difficulty of Paying Living Expenses: Not hard at all  Food Insecurity: No Food Insecurity (12/26/2022)   Hunger Vital Sign    Worried About Running Out of Food in the Last Year: Never true    Ran Out of Food in the Last Year: Never true  Transportation Needs: No Transportation Needs (12/26/2022)   PRAPARE - Administrator, Civil Service (Medical): No    Lack of Transportation (Non-Medical): No  Physical Activity: Insufficiently Active (10/01/2022)   Exercise Vital Sign    Days of Exercise per Week: 2 days    Minutes of Exercise per Session: 60 min  Stress: No Stress Concern Present (10/01/2022)   [employer] of  Occupational Health - Occupational Stress Questionnaire    Feeling of Stress : Not at all  Social Connections: Socially Integrated (10/01/2022)   Social Connection and Isolation Panel [NHANES]    Frequency of Communication with Friends and Family: Twice a week    Frequency of Social Gatherings with Friends and Family: Twice a week    Attends Religious Services: More than 4 times per year    Active Member of [employer] or Organizations: Yes    Attends Engineer, structural: More than 4 times per year    Marital Status: Living with partner     Vital Signs: BP 134/64  HR 68  98% on room air  Physical Exam Constitutional:      General: She is not in acute distress.    Appearance: She is not ill-appearing.  Pulmonary:     Effort: Pulmonary effort is normal.  Abdominal:     Palpations: Abdomen is soft.     Comments: Two right upper quadrant drains. The more cephalad is the peri-hepatic fluid collection drain and it is to gravity. The other drain is to suction and is the GB fossa drain. Mild erythema surrounds both drain insertion site. Sutures intact.   Skin:    General: Skin is warm and dry.  Neurological:     Mental Status: She is alert and oriented to person, place, and time.    Imaging: DG Chest 2 View  Result Date: 01/05/2023 CLINICAL DATA:  Airspace disease EXAM: CHEST - 2 VIEW COMPARISON:  CT abdomen earlier today.  Chest x-ray 11/09/2022. FINDINGS: Bibasilar opacities likely reflect atelectasis. Small right pleural effusion. Heart mediastinal contours within normal limits. No acute bony abnormality. Advanced degenerative changes in the shoulders. IMPRESSION: Small right pleural effusion. Bibasilar opacities most likely atelectasis. Electronically Signed   By: Charlett Nose M.D.   On: 01/05/2023 00:14   CT ABDOMEN PELVIS W CONTRAST  Result Date: 01/04/2023 CLINICAL DATA:  Abdominal pain.  Bile leak post cholecystectomy. EXAM: CT ABDOMEN AND PELVIS WITH CONTRAST TECHNIQUE:  Multidetector CT imaging of the abdomen and pelvis was performed using the standard protocol following bolus administration of intravenous contrast. RADIATION DOSE REDUCTION: This exam was performed according to the departmental dose-optimization program which includes automated exposure control, adjustment of the mA and/or kV according to patient size and/or use of iterative reconstruction technique. CONTRAST:  OMNIPAQUE IOHEXOL 300 MG/ML  SOLN COMPARISON:  Dec 26, 2022  FINDINGS: Lower chest: Small right pleural effusion. Ground-glass opacities within lingula. Subtle ground-glass opacities in the left lower lobe. Hepatobiliary: Post cholecystectomy. Biliary drain has been placed. Diminished perihepatic ascites. Percutaneous draining catheters are seen in the gallbladder fossa and along the liver capsule. Pancreas: Unremarkable. No pancreatic ductal dilatation or surrounding inflammatory changes. Spleen: Normal in size without focal abnormality. Adrenals/Urinary Tract: Adrenal glands are unremarkable. Kidneys are normal, without renal calculi, focal lesion, or hydronephrosis. Bladder is unremarkable. Stomach/Bowel: Stomach is within normal limits. No evidence of bowel wall thickening, distention, or inflammatory changes. Vascular/Lymphatic: Aortic atherosclerosis. No enlarged abdominal or pelvic lymph nodes. Reproductive: Negative. Other: No abdominal wall hernia. Expected postsurgical changes. No abdominopelvic ascites. Musculoskeletal: Right hip arthroplasty. Chronic T12 compression fracture. Osteoarthritic changes of the spine. IMPRESSION: 1. Post cholecystectomy. Biliary drain has been placed. Percutaneous draining catheters are seen in the gallbladder fossa and along the liver capsule. Diminished perihepatic ascites. 2. Small right pleural effusion. 3. Ground-glass opacities within the lingula and left lower lobe may represent atelectasis or pneumonia. 4. Aortic atherosclerosis. Aortic Atherosclerosis  (ICD10-I70.0). Electronically Signed   By: Ted Mcalpine M.D.   On: 01/04/2023 23:05    Labs:  CBC: Recent Labs    12/28/22 0124 12/29/22 0341 12/30/22 1259 01/04/23 2039  WBC 21.7* 15.7* 12.0* 11.5*  HGB 10.9* 9.4* 10.3* 10.1*  HCT 33.1* 28.4* 31.8* 31.9*  PLT 182 190 227 261    COAGS: Recent Labs    12/27/22 0742  INR 1.4*    BMP: Recent Labs    12/28/22 0124 12/29/22 0341 12/30/22 1259 01/04/23 2039  NA 127* 131* 135 135  K 3.7 3.8 3.5 4.7  CL 94* 99 102 100  CO2 26 26 23 26   GLUCOSE 128* 119* 126* 114*  BUN 13 16 14 10   CALCIUM 8.8* 8.5* 8.7* 8.9  CREATININE 0.77 0.91 0.83 0.85  GFRNONAA >60 >60 >60 >60    LIVER FUNCTION TESTS: Recent Labs    12/28/22 0124 12/29/22 0341 12/30/22 1259 01/04/23 2039  BILITOT 1.2 0.6 0.5 0.3  AST 20 13* 38 21  ALT 24 16 24 12   ALKPHOS 60 56 138* 68  PROT 5.1* 4.6* 5.4* 6.0*  ALBUMIN 2.3* 2.0* 2.2* 2.7*    Assessment:  Cholecystectomy with post-op bile leak. Gallbladder fossa and peri-hepatic drains placed in IR 12/27/22 by Dr. Elby Showers   Contrast injection under fluoroscopy demonstrated resolution of the GB fossa bile leak. This drain was removed. Injection of the peri-hepatic drain showed contrast flowing around the liver suggesting the area has not fully healed. This drain will remain in place to gravity and we will re-evaluate this drain in 2 weeks.   Findings discussed with the patient and her significant other. She was instructed to discontinue flushing the drain, keep the site clean and dry and to document any output.   She has an upcoming appointment with her surgical team in early June. She knows she can call our clinic with any questions/concerns prior to her next visit.   Signed: Mickie Kay, NP 01/06/2023, 2:10 PM   Please refer to Dr. Chester Holstein attestation of this note for management and plan.

## 2023-01-08 DIAGNOSIS — M6281 Muscle weakness (generalized): Secondary | ICD-10-CM | POA: Diagnosis not present

## 2023-01-08 DIAGNOSIS — R2681 Unsteadiness on feet: Secondary | ICD-10-CM | POA: Diagnosis not present

## 2023-01-12 ENCOUNTER — Other Ambulatory Visit: Payer: Medicare Other

## 2023-01-12 ENCOUNTER — Telehealth: Payer: Self-pay

## 2023-01-12 NOTE — Telephone Encounter (Signed)
Transition Care Management Unsuccessful Follow-up Telephone Call  Date of discharge and from where:  [facility] 5[facility]  Attempts:  1st Attempt  Reason for unsuccessful TCM follow-up call:  Left voice message   Lenard Forth [facility] Guide, [facility] [phone] [address] [place], [place], [place] [address] Phone: [phone] Email: [email or web address]

## 2023-01-14 ENCOUNTER — Encounter: Payer: Self-pay | Admitting: Internal Medicine

## 2023-01-14 ENCOUNTER — Telehealth: Payer: Self-pay | Admitting: Internal Medicine

## 2023-01-14 ENCOUNTER — Ambulatory Visit (INDEPENDENT_AMBULATORY_CARE_PROVIDER_SITE_OTHER): Payer: Medicare Other | Admitting: Internal Medicine

## 2023-01-14 VITALS — BP 130/76 | HR 68 | Ht 66.0 in

## 2023-01-14 DIAGNOSIS — K839 Disease of biliary tract, unspecified: Secondary | ICD-10-CM | POA: Diagnosis not present

## 2023-01-14 DIAGNOSIS — R197 Diarrhea, unspecified: Secondary | ICD-10-CM | POA: Diagnosis not present

## 2023-01-14 DIAGNOSIS — R112 Nausea with vomiting, unspecified: Secondary | ICD-10-CM | POA: Diagnosis not present

## 2023-01-14 DIAGNOSIS — R2681 Unsteadiness on feet: Secondary | ICD-10-CM | POA: Diagnosis not present

## 2023-01-14 DIAGNOSIS — M6281 Muscle weakness (generalized): Secondary | ICD-10-CM | POA: Diagnosis not present

## 2023-01-14 DIAGNOSIS — R5383 Other fatigue: Secondary | ICD-10-CM

## 2023-01-14 DIAGNOSIS — K52839 Microscopic colitis, unspecified: Secondary | ICD-10-CM

## 2023-01-14 DIAGNOSIS — K219 Gastro-esophageal reflux disease without esophagitis: Secondary | ICD-10-CM

## 2023-01-14 MED ORDER — COLESTIPOL HCL 1 G PO TABS: 2.0000 g | ORAL_TABLET | Freq: Two times a day (BID) | ORAL | 2 refills | Status: DC

## 2023-01-14 MED ORDER — OMEPRAZOLE 40 MG PO CPDR: 40.0000 mg | DELAYED_RELEASE_CAPSULE | Freq: Every day | ORAL | 1 refills | Status: DC

## 2023-01-14 NOTE — Progress Notes (Signed)
Chief Complaint: Bile leak s/p cholecystectomy, microscopic colitis  HPI : 77 year old female with history of microscopic colitis, gallstones s/p cholecystectomy c/b bile leak, IBS, DVT, DM, hiatal hernia, GERD, HTN, HFpEF, OSA, and psoriatic arthritis on Cimzia presents for follow up for bile leak s/p cholecystectomy and microscopic colitis  Interval History: Since her last clinic visit with me, she had a lap cholecystectomy on 12/23/22. Following her cholecystectomy, she was admitted on 5/18-5/22/24 for a bile leak and had 2 percutaneous drains placed by IR on 12/27/22 and a CBD stent placed via ERCP 12/28/22. She had the percutaneous gallbladder fossa drain removed on 5/29. Drainage has stopped from her remaining percutaneous right perihepatic drain. She is having some slight discomfort around the right side of the abdomen, but this is still much improved from when she first presented with the bile leak to the hospital. She has felt weak overall. Denies fevers. She is trying to eat, but this can be limited by nausea and diarrhea. Bowel habits change from day-to-day. She does think that after her cholecystectomy, she has had more diarrhea. She is taking about 6 doses of Imodium per day. She is still taking her budesonide every day. She is also taking on average one oxycodone per day. She has been vomiting on occasion. For instance, she vomited yesterday when she felt like she gagged on something that she ate. Denies chest burning or regurgitation. She is taking her omeprazole 40 mg BID.   Wt Readings from Last 3 Encounters:  01/04/23 193 lb (87.5 kg)  12/26/22 199 lb 1.2 oz (90.3 kg)  12/23/22 198 lb 15.8 oz (90.3 kg)    Current Outpatient Medications  Medication Sig Dispense Refill   acetaminophen (TYLENOL) 500 MG tablet Take 1 tablet (500 mg total) by mouth every 6 (six) hours as needed for mild pain or fever.     alum & mag hydroxide-simeth (MAALOX/MYLANTA) 200-200-20 MG/5ML suspension Take 30  mLs by mouth every 6 (six) hours as needed for indigestion or heartburn.     carvedilol (COREG) 6.25 MG tablet Take 6.25 mg by mouth 2 (two) times daily.     cyclobenzaprine (FLEXERIL) 10 MG tablet TAKE 1 TABLET BY MOUTH AT BEDTIME (Patient taking differently: Take 10 mg by mouth at bedtime.) 90 tablet 1   insulin glargine (LANTUS SOLOSTAR) 100 UNIT/ML Solostar Pen Inject 20 Units into the skin 2 (two) times daily. 15 mL 5   lisinopril (ZESTRIL) 20 MG tablet Take 1 tablet (20 mg total) by mouth daily. 90 tablet 3   metFORMIN (GLUCOPHAGE-XR) 500 MG 24 hr tablet TAKE 1 TABLET BY MOUTH TWICE A DAY 180 tablet 2   methotrexate (RHEUMATREX) 2.5 MG tablet Take 20 mg by mouth every Sunday at 6pm.     omeprazole (PRILOSEC) 40 MG capsule TAKE 1 CAPSULE BY MOUTH TWICE A DAY 180 capsule 1   oxyCODONE-acetaminophen (PERCOCET) 10-325 MG tablet Take 1 tablet by mouth every 6 (six) hours as needed for pain. 10 tablet 0   potassium chloride (KLOR-CON M) 10 MEQ tablet Take 2 tablets (20 mEq total) by mouth daily. (Patient taking differently: Take 10 mEq by mouth 2 (two) times daily.) 180 tablet 3   rosuvastatin (CRESTOR) 5 MG tablet TAKE 1 TABLET(5 MG) BY MOUTH DAILY (Patient taking differently: Take 5 mg by mouth every evening.) 90 tablet 3   sertraline (ZOLOFT) 100 MG tablet Take 1.5 tablets (150 mg total) by mouth daily. 135 tablet 1   sucralfate (CARAFATE) 1 GM/10ML  suspension Take 10 mLs (1 g total) by mouth 4 (four) times daily -  with meals and at bedtime. 414 mL 0   sodium chloride flush (NS) 0.9 % SOLN 5 mLs by Intracatheter route daily. (Patient not taking: Reported on 01/14/2023) 5 Syringe 0   No current facility-administered medications for this visit.   Review of Systems: All systems reviewed and negative except where noted in HPI.   Physical Exam: BP 130/76   Pulse 68   Ht 5\' 6"  (1.676 m)   BMI 31.15 kg/m  Constitutional: Pleasant,well-developed, female in no acute distress. HEENT: Normocephalic  and atraumatic. Conjunctivae are normal. No scleral icterus. Cardiovascular: Normal rate, regular rhythm.  Pulmonary/chest: Effort normal and breath sounds normal. No wheezing, rales or rhonchi. Abdominal: Soft, non-distended, mildly tender in the right side. Right-sided percutaneous drain in place with minimal serous output. Extremities: No edema Neurological: Alert and oriented to person place and time. Skin: Skin is warm and dry. No rashes noted. Psychiatric: Normal mood and affect. Behavior is normal.  Labs 11/2020: CMP with low K of 3.3, elevated glucose of 220, ALT mildly elevated at 45, albumin low at 3.2. HbA1C of 8.5%  Labs 05/2021: CBC with elevated WBC of 11.9. CRP mildly elevated at 1.2 (decreased from 1.6). ESR mildly elevated at 35.  Labs 10/2022: CBC showed a mildly elevated WBC of 12.2.  Lipase and LFTs were normal.   Labs 12/2022: CMP with low albumin of 2.7. Lipase nml. CBC with elevated WBC of 11.5 and low Hb of 10.1. Lipase nml.   CT A/P w/contrast 10/02/18: 1.  Descending and sigmoid colon segmental mural thickening and  pericolonic stranding indicating colitis.  Distribution of thickening  can suggest ischemic colitis although inflammatory colitis is not  excluded.  There are mild associated diverticula but no focal inflamed  diverticulum.  2.  Pancreas atrophy.  Multifocal hyperenhancing nodular density at  the tail of pancreas, 12 mm.  Small developing mass cannot be  excluded.  Recommend follow-up MRI pancreas in 6 months.  This finding  was not described in the initial [facility] preliminary report.  Ab U/S 10/03/18: Mild fatty liver. Cholelithiasis without findings of acute  cholecystitis. Extra hepatic bile ducts and pancreatic tail are  obscured.  CT A/P w/contrast 11/02/18: IMPRESSION: 1. Heterogeneous fatty replacement throughout the pancreas, without pancreatic mass or acute process. 2.  Possible constipation. 3. Hepatic steatosis and hepatomegaly. 4.  Tiny  hiatal hernia. 5.  Aortic Atherosclerosis (ICD10-I70.0). 6. Degraded evaluation of the pelvis, secondary to beam hardening artifact from right hip arthroplasty. 7. Uterine fibroid.  CT A/P w/o contrast 07/12/20: IMPRESSION: 1. Diverticulosis without diverticulitis. 2. No acute intra-abdominal or intrapelvic process. 3.  Aortic Atherosclerosis (ICD10-I70.0).  Barium swallow 09/27/20: IMPRESSION: 1. Proximal anterior esophageal web, nonobstructive to passage of a 13 mm barium tablet. 2. Otherwise unremarkable double contrast barium esophagram.  CT A/P w/contrast 10/21/22: IMPRESSION: 1. No definite acute CT findings of the abdomen or pelvis to explain abdominal discomfort or vomiting. No evidence of bowel obstruction. 2. 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 3. Diffusely atrophic appearance of the pancreas. No pancreatic ductal dilatation or surrounding inflammatory changes. Aortic Atherosclerosis (ICD10-I70.0).  Gastric emptying study 12/01/22: IMPRESSION: No scintigraphic evidence of delayed gastric emptying.  MR Liver w/contrast 12/26/22: IMPRESSION: 1. Study is positive for postoperative bile leak with bilomas noted in the gallbladder fossa and overlying the central aspect of  the liver, as detailed above. 2. Trace volume of perihepatic ascites. 3. Morphologic changes in the liver suggestive of cirrhosis. 4. Small right pleural effusion lying dependently. Passive areas of subsegmental atelectasis in the lower lobes of the lungs bilaterally.  CT drain placement 12/27/22: IMPRESSION: 1. Successful CT guided placement of a 8 [place] all purpose drain catheter into the gallbladder fossa with aspiration of 30 mL of bilious fluid. Samples were sent to the laboratory as requested by the ordering clinical team. 2. Successful CT-guided placement of a 10  [place] all-purpose drain catheter into the perihepatic fluid collection with aspiration of approximately 1 L of bilious fluid.  CT A/P w/contrast 01/04/23: IMPRESSION: 1. Post cholecystectomy. Biliary drain has been placed. Percutaneous draining catheters are seen in the gallbladder fossa and along the liver capsule. Diminished perihepatic ascites. 2. Small right pleural effusion. 3. Ground-glass opacities within the lingula and left lower lobe may represent atelectasis or pneumonia. 4. Aortic atherosclerosis.  Fistulogram 01/06/23: IMPRESSION: No definite fistula to the biliary tree. Contrast accumulates predominately in the perihepatic space and in the gallbladder fossa at both drain sites. Gallbladder fossa drain will be removed today. Right perihepatic drain were remain to gravity drainage for 2 more weeks with no flushes.  EGD 09/17/21: Findings: - Multiple mild (non-circumferential scarring) stenoses were found in the entire esophagus. The stenoses were traversed. A guidewire was placed and the scope was withdrawn. Dilation was performed with a Savary dilator with no resistance at 15 mm and mild resistance at 16 mm. The dilation site was examined and showed mild mucosal disruption in the upper esophagus. - A small hiatal hernia was present. - Localized mild inflammation characterized by congestion (edema), erythema and granularity was found in the gastric body and in the gastric antrum. Biopsies were taken with a cold forceps for histology. - Multiple sessile polyps with no bleeding and no stigmata of recent bleeding were found in the gastric body. Biopsies were taken with a cold forceps for histology. - The examined duodenum was normal. Impression: - Mild esophageal stenoses. Dilated. - Small hiatal hernia. - Gastritis. Biopsied. - Multiple gastric polyps. Biopsied. - Normal examined duodenum. Path: 1. Surgical [P], gastric - ANTRAL AND OXYNTIC MUCOSA WITH SLIGHT CHRONIC  INFLAMMATION. - NO HELICOBACTER PYLORI IDENTIFIED. 2. Surgical [P], gastric polyps - FUNDIC GLAND POLYPS.  Colonoscopy 09/17/21: - One 12 mm polyp in the cecum, removed with a hot snare. Resected and retrieved. - Seven 3 to 8 mm polyps in the transverse colon and in the ascending colon, removed with a cold snare. Resected and retrieved. - Diverticulosis in the sigmoid colon and in the descending colon. - One 7 mm polyp in the rectum, removed with a cold snare. Resected and retrieved. - Non-bleeding internal hemorrhoids. Path: 3. Surgical [P], colon, cecum, polyp (1) - TUBULAR ADENOMA WITHOUT HIGH GRADE DYSPLASIA. 4. Surgical [P], colon, ascending, transverse, polyp (7) - TUBULAR ADENOMA (7) WITHOUT HIGH GRADE DYSPLASIA. 5. Surgical [P], colon, random sites - UNREMARKABLE COLONIC MUCOSA. - NO MICROSCOPIC COLITIS, ACTIVE INFLAMMATION OR CHRONIC CHANGES. 6. Surgical [P], colon, rectum, polyp (1) - TUBULOVILLOUS ADENOMA WITHOUT HIGH GRADE DYPLASIA.  EUS 11/09/22:  Path: A. DUODENUM, BIOPSY:  - Duodenal mucosa without diagnostic abnormality  - Negative for celiac change  B. STOMACH, ANTRUM, BIOPSY:  - Antral mucosa with chronic, focally active gastritis  - Immunohistochemical stain is negative for Helicobacter organisms  - Negative for malignancy  - See comment  C. STOMACH, BODY, BIOPSY:  - Oxyntic mucosa with mild  chronic gastritis and proton pump inhibitor  type effect/early fundic gland polyp like changes  - Negative for Helicobacter organisms on HE stain  - Negative for malignancy  - See comment  COMMENT:  Immunohistochemical staining for CK AE1/AE3 is performed on both blocks  B and C.  The stains are negative which is consistent with the above  diagnosis.   ERCP 12/28/22:   ASSESSMENT AND PLAN: N&V Diarrhea S/p cholecystectomy c/b bile leak s/p stent placement 12/27/22 Microscopic colitis IBS GERD Hiatal hernia History of esophageal stenosis Atrophic appearance of  pancreas on CT in 10/2022 Patient presents for follow up of N&V and ab pain. She had her lap chole done on 12/23/22 and then subsequently developed a bile leak that was treated with CBD stent placement on 12/27/22 and placement of two percutaneous drains on 12/26/22. She has had one perc drain removed and will have the other one removed next week by IR. She is due for repeat ERCP for stent removal and repeat cholangiogram on 02/22/23, which is already scheduled at this time. She does still have N&V and weakness. I encouraged her to increase her protein intake and to work with physical therapy to increase her strength. Since patient still has N&V despite taking PPI BID, it is not clear to me that GERD is contributing to her N&V. Thus will have her decrease her omeprazole to QD. I encouraged her to try to take Zofran before she takes meals to reduce issues with N&V. For her diarrhea, will start her on colestipol to see if this will help with bile acid diarrhea due to recent cholecystectomy. Hopefully this will allow her to reduce the amount of Imodium that she is taking because I think she is occasionally getting constipated on the Imodium. She continues on budesonide due to her history of microscopic colitis. - Encourage high protein, high fiber, and low fat diet - Decrease omeprazole 40 mg from BID to QD.  - Continue budesonide 9 mg QD - Continue Zofran PRN. Instructed her to take this before eating meals to try to help prevent nausea. - Start colestipol 2 g BID - Okay to back off on Imodium - Continue follow up with IR for another drain check and likely removal on 6/12 - Plan for ERCP 02/22/23 with Dr. Leone Payor - RTC in 2 months  Eulah Pont, MD  I spent 48 minutes of time, including in depth chart review, independent review of results as outlined above, communicating results with the patient directly, face-to-face time with the patient, coordinating care, and ordering studies and medications as  appropriate, and documentation.

## 2023-01-14 NOTE — Patient Instructions (Addendum)
We have sent the following medications to your pharmacy for you to pick up at your convenience: Colestipol, Omeprazole   Start taking Omeprazole 1 capsule daily Decrease Imodium  You are schedule for a follow up visit on 04/30/23 at 8:50 am  If your blood pressure at your visit was 140/90 or greater, please contact your primary care physician to follow up on this.  _______________________________________________________  If you are age 78 or older, your body mass index should be between 23-30. Your Body mass index is 31.15 kg/m. If this is out of the aforementioned range listed, please consider follow up with your Primary Care Provider.  If you are age 71 or younger, your body mass index should be between 19-25. Your Body mass index is 31.15 kg/m. If this is out of the aformentioned range listed, please consider follow up with your Primary Care Provider.   ________________________________________________________  The  GI providers would like to encourage you to use [facility] to communicate with providers for non-urgent requests or questions.  Due to long hold times on the telephone, sending your provider a message by [facility] may be a faster and more efficient way to get a response.  Please allow 48 business hours for a response.  Please remember that this is for non-urgent requests.   Thank you for entrusting me with your care and for choosing [facility], Dr. Eulah Pont

## 2023-01-14 NOTE — Telephone Encounter (Signed)
Line busy

## 2023-01-14 NOTE — Telephone Encounter (Signed)
Patient is calling having some concerns regarding her medications states on of them has a side effect of diarrhea but she is supposed to be treated for diarrhea. Please advise

## 2023-01-15 ENCOUNTER — Telehealth: Payer: Self-pay

## 2023-01-15 NOTE — Telephone Encounter (Signed)
I spoke with the pt and she tells me that she reviewed her AVS and her questions have been answered.  She will let us know if she has any further questions or concerns.

## 2023-01-15 NOTE — Telephone Encounter (Signed)
Dr. Tresa Endo this is patient message about her BiPAP. I put her most recent download information in this message.   I called yesterday but no call back. I got my new bi- pap machine last week. Using the first 2 nights was miserable. After about an hour the air was pouring out the sides of the mask. I called French Ana at [employer] and told her the problems and she said my experience was normal and I would get used to the air and noise from the machine. I have tried w/ear plugs and I am just awakened too often. I'm sure Dr Tresa Endo had the air pressure set based on my recent sleep study. But I do not think things will work at current settings. I am concerned for several reasons. Medicare requires I see Dr Tresa Endo soon and I have to prove I am using the machine. At present time, I cannot use the machine enough for me to demonstrate use. Also, I had surgery on Wednesday and they told me I needed to wear the bi-pap even if for a nap due to dangers of breathing after anesthesia w/out the assistance of the machine. Please contact me.   [name] [name] [phone]) [facility] Compliance Report Usage 12/16/2022 - 01/14/2023 Usage days 26/30 days (87%) >= 4 hours 24 days (80%) < 4 hours 2 days (7%) Usage hours 175 hours 43 minutes Average usage (total days) 5 hours 51 minutes Average usage (days used) 6 hours 46 minutes Median usage (days used) 6 hours 37 minutes Total used hours (value since last reset - 01/14/2023) 206 hours AirCurve 10 VAuto Serial number [id number] Mode VAuto Max IPAP 25 cmH2O Min EPAP 18 cmH2O Pressure Support 4 cmH2O Therapy Leaks - L/min Median: 0.7 95th percentile: 5.8 Maximum: 39.8 Events per hour AI: 17.1 HI: 2.7 AHI: 19.8 Apnea Index Central: 0.5 Obstructive: 15.9 Unknown: 0.6 Usage - hours Printed on 01/15/2023 - ResMed AirView version 4.44.0-5.0 Page 1 of 1 [name] [name], [name] [name] 12/16/2022 - 01/14/2023 DOB: 1946/05/27 Age: 77 years [name] [name] [phone]) [facility] Therapy  Report AirCurve 10 VAuto SN: [id number] Usage (hours) Usage days 26/30 (87%) >= 4 hour days 24 (80%) < 4 hour days 2 (7%) Days not used 4 (13%) Days no data 0 (0%) Used/day (avg.) 6.8 hrs. Leak (L/min) Set threshold 24.0 L/min Maximum (avg) 39.8 95th % (avg) 5.8 Median (avg) 0.7 Pressure (cmH2O) Mode VAuto Set Max IPAP 25.0 Set Min EPAP 18.0 EPAP IPAP Maximum (avg) 20.7 24.7 95th % (avg) 20.1 24.1 Median (avg) 18.9 22.9 Pressure support 4.0 AHI (events/hour) AHI 19.8 HI 2.7 AI 17.1 CAI 0.5 OAI 15.9 UAI 0.6 Printed on 01/15/2023 - [employer] AirView version 4.44.0-5.0 Page 1 of 2 [name] [name], [name] [name] 12/16/2022 - 01/14/2023 DOB: Nov 21, 1945 Age: 77 years [name] [name] [phone]) [facility] Therapy Report AirCurve 10 VAuto SN: [id number] Tidal Volume (ml) Maximum (avg) 1100 95th % (avg) 585 Median (avg) 379 Respiratory Rate (breaths/min) Maximum (avg) 30 95th % (avg) 22 Median (avg) 15 Spontaneous breaths Cycled 91% Minute Ventilation (L/min) Maximum (avg) 14.0 95th % (avg) 8.6 Median (avg) 6.0 I:E Ratio (median in %) Maximum (avg) 1:1.19 95th % (avg) 1:1.76 Median (avg) 1:2.73 E% I% Printed on 01/15/2023 - Res[employer] AirView version 4.44.0-5.0 Page 2 of 2

## 2023-01-18 ENCOUNTER — Telehealth: Payer: Self-pay

## 2023-01-18 ENCOUNTER — Encounter: Payer: Self-pay | Admitting: Internal Medicine

## 2023-01-18 NOTE — Telephone Encounter (Signed)
Per Dr. Tresa Endo, Setting on CPAP machine changed today. The set Min EPAP was changed from 18.0 to 10.0. Set Pressure Support was changed from 4.0 to 5.0. Left VM for patient tor return call.

## 2023-01-20 ENCOUNTER — Ambulatory Visit
Admission: RE | Admit: 2023-01-20 | Discharge: 2023-01-20 | Disposition: A | Discharge status: Home / Self Care | Payer: Medicare Other | Source: Ambulatory Visit | Attending: General Surgery | Admitting: General Surgery

## 2023-01-20 DIAGNOSIS — K839 Disease of biliary tract, unspecified: Secondary | ICD-10-CM | POA: Diagnosis not present

## 2023-01-20 DIAGNOSIS — M5136 Other intervertebral disc degeneration, lumbar region: Secondary | ICD-10-CM | POA: Diagnosis not present

## 2023-01-20 DIAGNOSIS — M47896 Other spondylosis, lumbar region: Secondary | ICD-10-CM | POA: Diagnosis not present

## 2023-01-20 DIAGNOSIS — M5451 Vertebrogenic low back pain: Secondary | ICD-10-CM | POA: Diagnosis not present

## 2023-01-20 NOTE — Progress Notes (Signed)
Chief Complaint: Patient was seen in consultation today for follow up of perihepatic drain at the request of Wilson,Eric  Referring Physician(s): Wilson,Eric  Supervising Physician: Mir, Mauri Reading  History of Present Illness: [name] [name] is a 77 y.o. female with past medical  history significant for DM, HTN, depression/anxiety, DVT and laparoscopic cholecystectomy 12/23/22 for symptomatic cholelithiasis. Her surgery was uneventful and she was discharged home post-operatively. She presented to the ED 12/26/22 with complaints of severe abdominal pain and nausea. Work up was notable for leukocytosis and imaging showed fluid in the gallbladder fossa with perihepatic fluid.     She was admitted and her WBC increased significantly overnight. MR liver showed a post-operative bile leak with bilomas in the gallbladder fossa and overlying the central aspect of the liver. IR was consulted and on 12/27/22 Dr. Elby Showers placed a gallbladder fossa biloma drain and a peri-hepatic fluid collection drain.    She underwent ERCP with stent placement 12/28/22 and she was discharged from the hospital 12/30/22. She was evaluated in the ED 01/04/23 due to concerns of low output from the drains. Labs were within normal limits and CT imaging showed the drains to be appropriately placed.   She was seen in IR clinic for routine drain follow up 01/06/23 where she was noted to have significant improvement in the perihepatic abscess with resolution of the gallbladder fossa fluid collection. The gallbladder fossa drain was removed that same day and patient was instructed to d/c flushing of the perihepatic drain, monitor output and follow up with IR clinic in 2 weeks for which she presents today.  Ms. Creed reports not flushing the drain or changing the dressing since her last IR clinic follow up, she has had no output at all. She has some constipation but otherwise has been feeling well, eating and drinking at baseline, occasional  nausea but nothing persistent. She has another follow up appointment with surgery soon but cannot remember the date right now.   Past Medical History:  Diagnosis Date   Anemia    Anxiety    Arthritis    Closed fracture of fifth metatarsal bone 05/13/2018   Deep vein thrombosis (HCC)    right calf - 05/2012    Depression    Diabetes mellitus without complication (HCC)    diet controlled    GERD (gastroesophageal reflux disease)    Hyperlipidemia    Patient denies.   Hypertension    Ejection fraction =>55% Left ventricular systolic function is normal. Left ventricular wall motion is normal     Lymphocytic colitis    Neuropathy    diabetic - in bilateral feet    Pityriasis lichenoides chronica    Pneumonia    Sleep apnea    bipap    Past Surgical History:  Procedure Laterality Date   BILIARY STENT PLACEMENT  12/28/2022   Procedure: BILIARY STENT PLACEMENT;  Surgeon: Iva Boop, MD;  Location: [facility] ENDOSCOPY;  Service: Gastroenterology;;   BIOPSY  11/09/2022   Procedure: BIOPSY;  Surgeon: Lemar Lofty., MD;  Location: [facility] ENDOSCOPY;  Service: Gastroenterology;;   CHOLECYSTECTOMY N/A 12/23/2022   Procedure: LAPAROSCOPIC CHOLECYSTECTOMY WITH ICG DYE;  Surgeon: Gaynelle Adu, MD;  Location: [facility] ORS;  Service: General;  Laterality: N/A;   DILATION AND CURETTAGE OF UTERUS     ENDOSCOPIC RETROGRADE CHOLANGIOPANCREATOGRAPHY (ERCP) WITH PROPOFOL N/A 12/28/2022   Procedure: ENDOSCOPIC RETROGRADE CHOLANGIOPANCREATOGRAPHY (ERCP) WITH PROPOFOL;  Surgeon: Iva Boop, MD;  Location: [facility] ENDOSCOPY;  Service: Gastroenterology;  Laterality:  N/A;   ESOPHAGOGASTRODUODENOSCOPY N/A 11/09/2022   Procedure: ESOPHAGOGASTRODUODENOSCOPY (EGD);  Surgeon: Lemar Lofty., MD;  Location: [facility] ENDOSCOPY;  Service: Gastroenterology;  Laterality: N/A;   EYE SURGERY  07/2020   HAMMER TOE SURGERY     right hand surgery      due to blood infection    torn meniscus repair      right knee    TOTAL  HIP ARTHROPLASTY  09/13/2012   Procedure: TOTAL HIP ARTHROPLASTY ANTERIOR APPROACH;  Surgeon: Shelda Pal, MD;  Location: [facility] ORS;  Service: Orthopedics;  Laterality: Right;   UPPER ESOPHAGEAL ENDOSCOPIC ULTRASOUND (EUS) N/A 11/09/2022   Procedure: UPPER ESOPHAGEAL ENDOSCOPIC ULTRASOUND (EUS);  Surgeon: Lemar Lofty., MD;  Location: [facility] ENDOSCOPY;  Service: Gastroenterology;  Laterality: N/A;    Allergies: Other, Thorazine [chlorpromazine], Bee venom, Farxiga [dapagliflozin], Semaglutide, and Sulfa antibiotics  Medications: Prior to Admission medications   Medication Sig Start Date End Date Taking? Authorizing Provider  acetaminophen (TYLENOL) 500 MG tablet Take 1 tablet (500 mg total) by mouth every 6 (six) hours as needed for mild pain or fever. 12/30/22   Juliet Rude, PA-C  alum & mag hydroxide-simeth (MAALOX/MYLANTA) 200-200-20 MG/5ML suspension Take 30 mLs by mouth every 6 (six) hours as needed for indigestion or heartburn. 12/30/22   Juliet Rude, PA-C  carvedilol (COREG) 6.25 MG tablet Take 6.25 mg by mouth 2 (two) times daily. 12/26/22   [provider]  colestipol (COLESTID) 1 g tablet Take 2 tablets (2 g total) by mouth 2 (two) times daily. 01/14/23   Imogene Burn, MD  cyclobenzaprine (FLEXERIL) 10 MG tablet TAKE 1 TABLET BY MOUTH AT BEDTIME Patient taking differently: Take 10 mg by mouth at bedtime. 09/07/22   Pincus Sanes, MD  insulin glargine (LANTUS SOLOSTAR) 100 UNIT/ML Solostar Pen Inject 20 Units into the skin 2 (two) times daily. 10/14/22   Pincus Sanes, MD  lisinopril (ZESTRIL) 20 MG tablet Take 1 tablet (20 mg total) by mouth daily. 10/20/21   Lennette Bihari, MD  metFORMIN (GLUCOPHAGE-XR) 500 MG 24 hr tablet TAKE 1 TABLET BY MOUTH TWICE A DAY 07/24/22   Pincus Sanes, MD  methotrexate (RHEUMATREX) 2.5 MG tablet Take 20 mg by mouth every Sunday at 6pm. 06/24/16   [provider]  omeprazole (PRILOSEC) 40 MG capsule Take 1 capsule (40 mg total)  by mouth daily. 01/14/23   Imogene Burn, MD  oxyCODONE-acetaminophen (PERCOCET) 10-325 MG tablet Take 1 tablet by mouth every 6 (six) hours as needed for pain. 12/30/22   Juliet Rude, PA-C  potassium chloride (KLOR-CON M) 10 MEQ tablet Take 2 tablets (20 mEq total) by mouth daily. Patient taking differently: Take 10 mEq by mouth 2 (two) times daily. 09/18/22   Pincus Sanes, MD  rosuvastatin (CRESTOR) 5 MG tablet TAKE 1 TABLET(5 MG) BY MOUTH DAILY Patient taking differently: Take 5 mg by mouth every evening. 03/19/22   Pincus Sanes, MD  sertraline (ZOLOFT) 100 MG tablet Take 1.5 tablets (150 mg total) by mouth daily. 09/21/22   Pincus Sanes, MD  sodium chloride flush (NS) 0.9 % SOLN 5 mLs by Intracatheter route daily. Patient not taking: Reported on 01/14/2023 12/30/22   Juliet Rude, PA-C  sucralfate (CARAFATE) 1 GM/10ML suspension Take 10 mLs (1 g total) by mouth 4 (four) times daily -  with meals and at bedtime. 12/30/22   Juliet Rude, PA-C     Family History  Problem  Relation Age of Onset   Hypertension Mother     Social History   Socioeconomic History   Marital status: Divorced    Spouse name: Not on file   Number of children: Not on file   Years of education: Not on file   Highest education level: Not on file  Occupational History   Not on file  Tobacco Use   Smoking status: Never   Smokeless tobacco: Never  Substance and Sexual Activity   Alcohol use: Yes    Comment: occasional wine    Drug use: No   Sexual activity: Not on file  Other Topics Concern   Not on file  Social History Narrative   She is widowed. She has 2 children, 2 grandchildren. She does drink occasional alcohol . There is no tobacco . She does not have a routine exercise program   Social Determinants of Health   Financial Resource Strain: Low Risk  (10/01/2022)   Overall Financial Resource Strain (CARDIA)    Difficulty of Paying Living Expenses: Not hard at all  Food Insecurity: No Food  Insecurity (12/26/2022)   Hunger Vital Sign    Worried About Running Out of Food in the Last Year: Never true    Ran Out of Food in the Last Year: Never true  Transportation Needs: No Transportation Needs (12/26/2022)   PRAPARE - Administrator, Civil Service (Medical): No    Lack of Transportation (Non-Medical): No  Physical Activity: Insufficiently Active (10/01/2022)   Exercise Vital Sign    Days of Exercise per Week: 2 days    Minutes of Exercise per Session: 60 min  Stress: No Stress Concern Present (10/01/2022)   [employer] of Occupational Health - Occupational Stress Questionnaire    Feeling of Stress : Not at all  Social Connections: Socially Integrated (10/01/2022)   Social Connection and Isolation Panel [NHANES]    Frequency of Communication with Friends and Family: Twice a week    Frequency of Social Gatherings with Friends and Family: Twice a week    Attends Religious Services: More than 4 times per year    Active Member of [employer] or Organizations: Yes    Attends Engineer, structural: More than 4 times per year    Marital Status: Living with partner    Review of Systems: A 12 point ROS discussed and pertinent positives are indicated in the HPI above.  All other systems are negative.  Review of Systems  Constitutional:  Negative for appetite change, chills and fever.  Respiratory:  Negative for shortness of breath.   Cardiovascular:  Negative for chest pain.  Gastrointestinal:  Positive for constipation. Negative for abdominal pain, diarrhea, nausea and vomiting.    Vital Signs: There were no vitals taken for this visit.  Physical Exam Constitutional:      General: She is not in acute distress. HENT:     Head: Normocephalic.  Pulmonary:     Effort: Pulmonary effort is normal.  Abdominal:     General: There is no distension.     Palpations: Abdomen is soft.     Tenderness: There is no abdominal tenderness.     Comments: (+) RUQ drain  completely removed from insertion site, pigtail outside of skin and tract is closed. Retention suture remains in tact. No significant erythema, edema or drainage.  Skin:    General: Skin is warm and dry.  Neurological:     Mental Status: She is alert and oriented to person,  place, and time.  Psychiatric:        Mood and Affect: Mood normal.        Behavior: Behavior normal.        Thought Content: Thought content normal.        Judgment: Judgment normal.     Imaging: DG Sinus/Fist Tube Chk-Non GI  Result Date: 01/06/2023 INDICATION: Laparoscopic cholecystectomy complicated by postoperative bile leak and perihepatic biloma. Status post 2 CT-guided percutaneous drains in the right perihepatic fluid collection and the gallbladder fossa. EXAM: FLUOROSCOPIC INJECTION OF THE RIGHT PERIHEPATIC AND GALLBLADDER FOSSA DRAINS MEDICATIONS: patient is currently an outpatient completing oral antibiotics ANESTHESIA/SEDATION: None. COMPLICATIONS: None immediate. PROCEDURE: Informed written consent was obtained from the patient after a thorough discussion of the procedural risks, benefits and alternatives. All questions were addressed. Maximal Sterile Barrier Technique was utilized including caps, mask, sterile gowns, sterile gloves, sterile drape, hand hygiene and skin antiseptic. A timeout was performed prior to the initiation of the procedure. The perihepatic and gallbladder fossa drains were injected with contrast. Fluoroscopic imaging performed. Right perihepatic drain: Drain catheter stable in position. Fluid collection by CT has nearly resolved. Output has decreased. Upon injection, contrast flows predominately within the perihepatic space. No definite communication to the biliary tree. Gallbladder fossa drain: Drain catheter is slightly retracted and is partially occluded. This drain is had little to no output. This drain is somewhat resistant to injection. Contrast also accumulates in the same perihepatic  space as well as the gallbladder fossa but no definite communication to the biliary tree. IMPRESSION: No definite fistula to the biliary tree. Contrast accumulates predominately in the perihepatic space and in the gallbladder fossa at both drain sites. Gallbladder fossa drain will be removed today. Right perihepatic drain were remain to gravity drainage for 2 more weeks with no flushes. Electronically Signed   By: Judie Petit.  Shick M.D.   On: 01/06/2023 14:25   DG Chest 2 View  Result Date: 01/05/2023 CLINICAL DATA:  Airspace disease EXAM: CHEST - 2 VIEW COMPARISON:  CT abdomen earlier today.  Chest x-ray 11/09/2022. FINDINGS: Bibasilar opacities likely reflect atelectasis. Small right pleural effusion. Heart mediastinal contours within normal limits. No acute bony abnormality. Advanced degenerative changes in the shoulders. IMPRESSION: Small right pleural effusion. Bibasilar opacities most likely atelectasis. Electronically Signed   By: Charlett Nose M.D.   On: 01/05/2023 00:14   CT ABDOMEN PELVIS W CONTRAST  Result Date: 01/04/2023 CLINICAL DATA:  Abdominal pain.  Bile leak post cholecystectomy. EXAM: CT ABDOMEN AND PELVIS WITH CONTRAST TECHNIQUE: Multidetector CT imaging of the abdomen and pelvis was performed using the standard protocol following bolus administration of intravenous contrast. RADIATION DOSE REDUCTION: This exam was performed according to the departmental dose-optimization program which includes automated exposure control, adjustment of the mA and/or kV according to patient size and/or use of iterative reconstruction technique. CONTRAST:  OMNIPAQUE IOHEXOL 300 MG/ML  SOLN COMPARISON:  Dec 26, 2022 FINDINGS: Lower chest: Small right pleural effusion. Ground-glass opacities within lingula. Subtle ground-glass opacities in the left lower lobe. Hepatobiliary: Post cholecystectomy. Biliary drain has been placed. Diminished perihepatic ascites. Percutaneous draining catheters are seen in the  gallbladder fossa and along the liver capsule. Pancreas: Unremarkable. No pancreatic ductal dilatation or surrounding inflammatory changes. Spleen: Normal in size without focal abnormality. Adrenals/Urinary Tract: Adrenal glands are unremarkable. Kidneys are normal, without renal calculi, focal lesion, or hydronephrosis. Bladder is unremarkable. Stomach/Bowel: Stomach is within normal limits. No evidence of bowel wall thickening, distention, or inflammatory  changes. Vascular/Lymphatic: Aortic atherosclerosis. No enlarged abdominal or pelvic lymph nodes. Reproductive: Negative. Other: No abdominal wall hernia. Expected postsurgical changes. No abdominopelvic ascites. Musculoskeletal: Right hip arthroplasty. Chronic T12 compression fracture. Osteoarthritic changes of the spine. IMPRESSION: 1. Post cholecystectomy. Biliary drain has been placed. Percutaneous draining catheters are seen in the gallbladder fossa and along the liver capsule. Diminished perihepatic ascites. 2. Small right pleural effusion. 3. Ground-glass opacities within the lingula and left lower lobe may represent atelectasis or pneumonia. 4. Aortic atherosclerosis. Aortic Atherosclerosis (ICD10-I70.0). Electronically Signed   By: Ted Mcalpine M.D.   On: 01/04/2023 23:05   DG ERCP  Result Date: 12/28/2022 CLINICAL DATA:  [id number] Surgery, elective [id number], post cholecystectomy, status post gallbladder fossa and perihepatic drain catheter placement EXAM: ERCP TECHNIQUE: Multiple spot images obtained with the fluoroscopic device and submitted for interpretation post-procedure. COMPARISON:  None Available. FINDINGS: A series of fluoroscopic spot images document endoscopic cannulation and opacification of the CBD and cystic duct stump. No definite leak from the cystic duct stump but there is some extravasation at the inferior or lateral margin of the right lobe extending adjacent to the drain catheter. Intrahepatic biliary ducts appear decompressed.  IMPRESSION: 1. No definite leak from the cystic duct stump. 2. Extravasation adjacent to the drain catheter, possibly from duct of Luschka or injury to small intrahepatic ducts. Electronically Signed   By: Corlis Leak M.D.   On: 12/28/2022 15:10   CT GUIDED VISCERAL FLUID DRAIN BY PERC CATH  Result Date: 12/28/2022 INDICATION: 77 year old female status post laparoscopic cholecystectomy complicated postoperatively by biloma presenting with sepsis. EXAM: CT IMAGE GUIDED DRAINAGE BY PERCUTANEOUS CATHETER x2 COMPARISON:  12/26/2022 MEDICATIONS: The patient is currently admitted to the hospital and receiving intravenous antibiotics. The antibiotics were administered within an appropriate time frame prior to the initiation of the procedure. ANESTHESIA/SEDATION: Moderate (conscious) sedation was employed during this procedure. A total of Versed 1.5 mg and Fentanyl 75 mcg was administered intravenously. Moderate Sedation Time: 16 minutes. The patient's level of consciousness and vital signs were monitored continuously by radiology nursing throughout the procedure under my direct supervision. CONTRAST:  None COMPLICATIONS: None immediate. PROCEDURE: RADIATION DOSE REDUCTION: This exam was performed according to the departmental dose-optimization program which includes automated exposure control, adjustment of the mA and/or kV according to patient size and/or use of iterative reconstruction technique. Informed written consent was obtained from the patient after a discussion of the risks, benefits and alternatives to treatment. The patient was placed supine on the CT gantry and a pre procedural CT was performed re-demonstrating the known abscess/fluid collection within the gallbladder fossa and right perihepatic region. The procedure was planned. A timeout was performed prior to the initiation of the procedure. The right upper quadrant was prepped and draped in the usual sterile fashion. The overlying soft tissues were  anesthetized with 1% lidocaine with epinephrine. Appropriate trajectory was planned with the use of a 22 gauge spinal needle. An 18 gauge trocar needle was advanced into the abscess/fluid collection and a short Amplatz super stiff wire was coiled within the collection. Appropriate positioning was confirmed with a limited CT scan. The tract was serially dilated allowing placement of a 8 [place] skater drainage catheter. Appropriate positioning was confirmed with a limited postprocedural CT scan. Approximately 30 ml of bilious fluid was aspirated. The tube was connected to a bulb suction and sutured in place. Given inadequate drainage of perihepatic fluid collection after gallbladder fossa drain placement, additional drain placement was warranted. Just superior to  the gallbladder fossa drain, the overlying soft tissues were anesthetized with 1% lidocaine with epinephrine. Appropriate trajectory was planned with the use of a 22 gauge spinal needle. An 18 gauge trocar needle was advanced into the abscess/fluid collection and a short Amplatz super stiff wire was coiled within the collection. Appropriate positioning was confirmed with a limited CT scan. The tract was serially dilated allowing placement of a 10 [place] multipurpose drainage catheter. Appropriate positioning was confirmed with a limited postprocedural CT scan. Approximately 1,000 ml of bilious fluid was aspirated. The tube was connected to a bag drainage and sutured in place. A fluid sample was sent to evaluate for culture as well as fluid bilirubin. A dressing was placed. The patient tolerated the procedure well without immediate post procedural complication. IMPRESSION: 1. Successful CT guided placement of a 8 [place] all purpose drain catheter into the gallbladder fossa with aspiration of 30 mL of bilious fluid. Samples were sent to the laboratory as requested by the ordering clinical team. 2. Successful CT-guided placement of a 10 French all-purpose drain  catheter into the perihepatic fluid collection with aspiration of approximately 1 L of bilious fluid. Marliss Coots, MD [facility] Radiology Electronically Signed   By: Marliss Coots M.D.   On: 12/28/2022 08:19   CT GUIDED VISCERAL FLUID DRAIN BY PERC CATH  Result Date: 12/28/2022 INDICATION: 77 year old female status post laparoscopic cholecystectomy complicated postoperatively by biloma presenting with sepsis. EXAM: CT IMAGE GUIDED DRAINAGE BY PERCUTANEOUS CATHETER x2 COMPARISON:  12/26/2022 MEDICATIONS: The patient is currently admitted to the hospital and receiving intravenous antibiotics. The antibiotics were administered within an appropriate time frame prior to the initiation of the procedure. ANESTHESIA/SEDATION: Moderate (conscious) sedation was employed during this procedure. A total of Versed 1.5 mg and Fentanyl 75 mcg was administered intravenously. Moderate Sedation Time: 16 minutes. The patient's level of consciousness and vital signs were monitored continuously by radiology nursing throughout the procedure under my direct supervision. CONTRAST:  None COMPLICATIONS: None immediate. PROCEDURE: RADIATION DOSE REDUCTION: This exam was performed according to the departmental dose-optimization program which includes automated exposure control, adjustment of the mA and/or kV according to patient size and/or use of iterative reconstruction technique. Informed written consent was obtained from the patient after a discussion of the risks, benefits and alternatives to treatment. The patient was placed supine on the CT gantry and a pre procedural CT was performed re-demonstrating the known abscess/fluid collection within the gallbladder fossa and right perihepatic region. The procedure was planned. A timeout was performed prior to the initiation of the procedure. The right upper quadrant was prepped and draped in the usual sterile fashion. The overlying soft  tissues were anesthetized with 1% lidocaine with epinephrine. Appropriate trajectory was planned with the use of a 22 gauge spinal needle. An 18 gauge trocar needle was advanced into the abscess/fluid collection and a short Amplatz super stiff wire was coiled within the collection. Appropriate positioning was confirmed with a limited CT scan. The tract was serially dilated allowing placement of a 8 [place] skater drainage catheter. Appropriate positioning was confirmed with a limited postprocedural CT scan. Approximately 30 ml of bilious fluid was aspirated. The tube was connected to a bulb suction and sutured in place. Given inadequate drainage of perihepatic fluid collection after gallbladder fossa drain placement, additional drain placement was warranted. Just superior to the gallbladder fossa drain, the overlying soft tissues were anesthetized with 1% lidocaine with epinephrine. Appropriate trajectory was planned with the use of  a 22 gauge spinal needle. An 18 gauge trocar needle was advanced into the abscess/fluid collection and a short Amplatz super stiff wire was coiled within the collection. Appropriate positioning was confirmed with a limited CT scan. The tract was serially dilated allowing placement of a 10 [place] multipurpose drainage catheter. Appropriate positioning was confirmed with a limited postprocedural CT scan. Approximately 1,000 ml of bilious fluid was aspirated. The tube was connected to a bag drainage and sutured in place. A fluid sample was sent to evaluate for culture as well as fluid bilirubin. A dressing was placed. The patient tolerated the procedure well without immediate post procedural complication. IMPRESSION: 1. Successful CT guided placement of a 8 [place] all purpose drain catheter into the gallbladder fossa with aspiration of 30 mL of bilious fluid. Samples were sent to the laboratory as requested by the ordering clinical team. 2. Successful CT-guided placement of a 10 French  all-purpose drain catheter into the perihepatic fluid collection with aspiration of approximately 1 L of bilious fluid. Marliss Coots, MD [facility] Radiology Electronically Signed   By: Marliss Coots M.D.   On: 12/28/2022 08:19   MR LIVER W WO CONTRAST  Result Date: 12/27/2022 CLINICAL DATA:  77 year old female status post cholecystectomy with abdominal pain. Evaluate for potential bile leak. EXAM: MRI ABDOMEN WITHOUT AND WITH CONTRAST TECHNIQUE: Multiplanar multisequence MR imaging of the abdomen was performed both before and after the administration of intravenous contrast. CONTRAST:  9mL EOVIST GADOXETATE DISODIUM 0.25 MOL/L IV SOLN COMPARISON:  No prior abdominal MRI. CT of the abdomen and pelvis 12/26/2022. FINDINGS: Lower chest: Small right pleural effusion lying dependently with areas of passive atelectasis in the lower lobes of the lungs bilaterally. Hepatobiliary: Liver has a shrunken appearance and nodular contour, indicative of underlying cirrhosis. No suspicious cystic or solid hepatic lesions. No intra or extrahepatic biliary ductal dilatation. Patient is status post cholecystectomy. Small amount of fluid in the gallbladder fossa is T1 hypointense and heterogeneously T2 hyperintense. On 20 minutes delayed post gadolinium images with Eovist there is a small amount of excreted contrast material which extends into the lateral aspect of this collection (best appreciated on axial image 55 of series 22). In addition, more abundant excretion of contrast is noted into a perihepatic fluid collection anterolaterally to the liver best appreciated on axial image 52 of series 22, indicative of postoperative bile leak with biloma in the gallbladder fossa and perihepatic region. This perihepatic fluid collection is T1 hypointense, T2 hyperintense and contains excreted contrast material and 20 minute post gadolinium delayed images, surrounding much of the central  aspect of the liver adjacent to portions of segments 4 A, 4B, 5 and 8. Common bile duct measures 4 mm in the porta hepatis. No filling defect in the common bile duct to suggest choledocholithiasis. Pancreas: No pancreatic mass. No pancreatic ductal dilatation. No peripancreatic fluid collections or inflammatory changes. Spleen:  Unremarkable. Adrenals/Urinary Tract: Bilateral kidneys and adrenal glands are normal in appearance. No hydroureteronephrosis. Stomach/Bowel: Visualized portions are unremarkable. Vascular/Lymphatic: No aneurysm identified in the visualized abdominal vasculature. No lymphadenopathy noted in the abdomen. Other:  Trace volume of perihepatic ascites. Musculoskeletal: No aggressive appearing osseous lesions are noted in the visualized portions of the skeleton. IMPRESSION: 1. Study is positive for postoperative bile leak with bilomas noted in the gallbladder fossa and overlying the central aspect of the liver, as detailed above. 2. Trace volume of perihepatic ascites. 3. Morphologic changes in the liver suggestive of cirrhosis. 4.  Small right pleural effusion lying dependently. Passive areas of subsegmental atelectasis in the lower lobes of the lungs bilaterally. These results will be called to the ordering clinician or representative by the Radiologist Assistant, and communication documented in the PACS or [employer]. Electronically Signed   By: Trudie Reed M.D.   On: 12/27/2022 07:56   CT ABDOMEN PELVIS W CONTRAST  Result Date: 12/26/2022 CLINICAL DATA:  77 year old female with abdominal pain. Postoperative day 3 status post cholecystectomy. EXAM: CT ABDOMEN AND PELVIS WITH CONTRAST TECHNIQUE: Multidetector CT imaging of the abdomen and pelvis was performed using the standard protocol following bolus administration of intravenous contrast. RADIATION DOSE REDUCTION: This exam was performed according to the departmental dose-optimization program which includes automated exposure  control, adjustment of the mA and/or kV according to patient size and/or use of iterative reconstruction technique. CONTRAST:  75mL OMNIPAQUE IOHEXOL 350 MG/ML SOLN COMPARISON:  CT Abdomen and Pelvis 10/21/2022 and earlier. FINDINGS: Lower chest: Small layering pleural effusions are new. No pericardial effusion. Mild dependent lung base opacity most resembles atelectasis. Hepatobiliary: Nodular liver contour with small volume new perihepatic ascites (series 3, image 23), simple fluid density. However, gallbladder also surgically absent now. Surgical clips and expected postoperative changes in the gallbladder fossa. No discrete liver lesion. Pancreas: Atrophied. Spleen: Stable spleen at the upper limits of normal. Adrenals/Urinary Tract: Normal adrenal glands. Nonobstructed kidneys with symmetric renal enhancement and contrast excretion. Distended but otherwise unremarkable bladder. Stomach/Bowel: No dilated large or small bowel loops. Retained stool in the colon. Diverticulosis of the descending and sigmoid colon. Redundant right colon. Cecum on a lax mesentery in the anterior abdomen. Normal appendix arising on series 3, image 66. No abnormal small bowel. Small gastric hiatal hernia. Small volume fluid in the stomach. Decompressed duodenum. No free air. Vascular/Lymphatic: Aortoiliac calcified atherosclerosis. Major arterial structures are patent. Normal caliber abdominal aorta. Portal venous system is patent. No lymphadenopathy identified. Reproductive: Negative. Other: Trace new pelvis free fluid. Mild ventral abdominal wall postoperative changes with no adverse features. Musculoskeletal: Right hip arthroplasty. Advanced degeneration in the spine. Chronic T12 compression fracture. Stable visualized osseous structures. IMPRESSION: 1. Recent cholecystectomy with largely expected postoperative changes. A small volume of Ascites is new, nonspecific, but with underlying nodular liver contour raising the possibility of  Cirrhosis. 2. Small layering pleural effusions are new with lung base atelectasis. 3. No other acute or inflammatory process identified in the abdomen or pelvis. Aortic Atherosclerosis (ICD10-I70.0). Electronically Signed   By: Odessa Fleming M.D.   On: 12/26/2022 12:34    Labs:  CBC: Recent Labs    12/28/22 0124 12/29/22 0341 12/30/22 1259 01/04/23 2039  WBC 21.7* 15.7* 12.0* 11.5*  HGB 10.9* 9.4* 10.3* 10.1*  HCT 33.1* 28.4* 31.8* 31.9*  PLT 182 190 227 261    COAGS: Recent Labs    12/27/22 0742  INR 1.4*    BMP: Recent Labs    12/28/22 0124 12/29/22 0341 12/30/22 1259 01/04/23 2039  NA 127* 131* 135 135  K 3.7 3.8 3.5 4.7  CL 94* 99 102 100  CO2 26 26 23 26   GLUCOSE 128* 119* 126* 114*  BUN 13 16 14 10   CALCIUM 8.8* 8.5* 8.7* 8.9  CREATININE 0.77 0.91 0.83 0.85  GFRNONAA >60 >60 >60 >60    LIVER FUNCTION TESTS: Recent Labs    12/28/22 0124 12/29/22 0341 12/30/22 1259 01/04/23 2039  BILITOT 1.2 0.6 0.5 0.3  AST 20 13* 38 21  ALT 24 16 24  12  ALKPHOS 60 56 138* 68  PROT 5.1* 4.6* 5.4* 6.0*  ALBUMIN 2.3* 2.0* 2.2* 2.7*    TUMOR MARKERS: No results for input(s): "AFPTM", "CEA", "CA199", "CHROMGRNA" in the last 8760 hours.  Assessment:  77 y/o F with history of cholecystectomy 12/23/22 and subsequent post operative fluid collection requiring GB fossa and perihepatic cain placements 12/27/22 in IR (Dr. Elby Showers) who returns today for drain follow up.  Patient underwent removal of GB fossa drain at last IR clinic follow up 01/06/23. She was instructed at that time to stop flushing the remaining perihepatic drain, monitor output and return to clinic in 2 weeks. Today she reports zero output from the drain since her last clinic visit, she has also not changed her dressing since her last visit with [place]. On removal of the dressing during today's visit the catheter was completely removed from the insertion site and the tract has closed over. The retention suture remains in  place which was removed and a clean dressing applied to previous insertion site per patient preference. She denies any significant abdominal pain, nausea, vomiting, diarrhea, fevers or chills in the last 2 weeks.   Reviewed above as well as previous imaging with Dr. Bryn Gulling who recommends to monitor for concerning s/s such as persistent abdominal pain/nausea/vomiting/diarrhea, fever, chills, etc. No additional imaging needed today given patient asymptomatic for the last 2 weeks with drain likely out of place for much of that time based on healed insertion site.  Patient encouraged to keep her surgical follow up and discuss any new symptoms with her general surgeon in the interim or to present to the ED if they are unmanageable. She states understanding and agreement to this plan.  No further IR follow up indicated at this time.   Electronically Signed: Villa Herb PA-C 01/20/2023, 1:44 PM   Please refer to Dr. Roosevelt Locks attestation of this note for management and plan.

## 2023-01-22 DIAGNOSIS — R2681 Unsteadiness on feet: Secondary | ICD-10-CM | POA: Diagnosis not present

## 2023-01-22 DIAGNOSIS — M6281 Muscle weakness (generalized): Secondary | ICD-10-CM | POA: Diagnosis not present

## 2023-01-22 MED ORDER — SERTRALINE HCL 100 MG PO TABS
200.0000 mg | ORAL_TABLET | Freq: Every day | ORAL | 1 refills | Status: DC
Start: 2023-01-22 — End: 2023-05-18

## 2023-01-22 NOTE — Addendum Note (Signed)
Addended by: Pincus Sanes on: 01/22/2023 07:56 AM   Modules accepted: Orders

## 2023-01-24 ENCOUNTER — Encounter: Payer: Self-pay | Admitting: Internal Medicine

## 2023-01-25 ENCOUNTER — Telehealth: Payer: Self-pay | Admitting: Internal Medicine

## 2023-01-25 ENCOUNTER — Encounter: Payer: Self-pay | Admitting: Internal Medicine

## 2023-01-25 DIAGNOSIS — Z5181 Encounter for therapeutic drug level monitoring: Secondary | ICD-10-CM | POA: Diagnosis not present

## 2023-01-25 DIAGNOSIS — Z79899 Other long term (current) drug therapy: Secondary | ICD-10-CM | POA: Diagnosis not present

## 2023-01-25 MED ORDER — FLUCONAZOLE 150 MG PO TABS: 150.0000 mg | ORAL_TABLET | Freq: Once | ORAL | 0 refills | Status: AC

## 2023-01-25 NOTE — Telephone Encounter (Signed)
Pt is requesting a call back from Dr. Lawerance Bach CMA. Pt believes she has a yeast infection after taking a round of antibiotics and would like to know what she should do. Pt declined to schedule appointment.   Please call pt: MRN: [id number]

## 2023-01-25 NOTE — Telephone Encounter (Signed)
Left detailed message for pt regarding Rx that was sent today

## 2023-01-25 NOTE — Telephone Encounter (Signed)
Diflucan sent to [facility]

## 2023-01-28 ENCOUNTER — Other Ambulatory Visit: Payer: Self-pay | Admitting: Radiology

## 2023-01-28 ENCOUNTER — Encounter: Payer: Self-pay | Admitting: Internal Medicine

## 2023-01-28 MED ORDER — FLUCONAZOLE 150 MG PO TABS
ORAL_TABLET | ORAL | 1 refills | Status: DC
Start: 2023-01-28 — End: 2023-03-18

## 2023-01-28 MED ORDER — LANTUS SOLOSTAR 100 UNIT/ML ~~LOC~~ SOPN: 20.0000 [IU] | PEN_INJECTOR | Freq: Two times a day (BID) | SUBCUTANEOUS | 5 refills | Status: DC

## 2023-02-02 ENCOUNTER — Encounter: Payer: Self-pay | Admitting: Internal Medicine

## 2023-02-02 MED ORDER — LANTUS SOLOSTAR 100 UNIT/ML ~~LOC~~ SOPN: 20.0000 [IU] | PEN_INJECTOR | Freq: Two times a day (BID) | SUBCUTANEOUS | 2 refills | Status: DC

## 2023-02-03 DIAGNOSIS — R2681 Unsteadiness on feet: Secondary | ICD-10-CM | POA: Diagnosis not present

## 2023-02-03 DIAGNOSIS — M6281 Muscle weakness (generalized): Secondary | ICD-10-CM | POA: Diagnosis not present

## 2023-02-04 DIAGNOSIS — L4059 Other psoriatic arthropathy: Secondary | ICD-10-CM | POA: Diagnosis not present

## 2023-02-04 DIAGNOSIS — Z79899 Other long term (current) drug therapy: Secondary | ICD-10-CM | POA: Diagnosis not present

## 2023-02-05 ENCOUNTER — Ambulatory Visit: Payer: Medicare Other | Admitting: Internal Medicine

## 2023-02-05 ENCOUNTER — Encounter: Payer: Self-pay | Admitting: Internal Medicine

## 2023-02-08 ENCOUNTER — Encounter: Payer: Self-pay | Admitting: Internal Medicine

## 2023-02-08 ENCOUNTER — Encounter (HOSPITAL_COMMUNITY): Payer: Self-pay | Admitting: Internal Medicine

## 2023-02-08 DIAGNOSIS — M6281 Muscle weakness (generalized): Secondary | ICD-10-CM | POA: Diagnosis not present

## 2023-02-08 DIAGNOSIS — R2681 Unsteadiness on feet: Secondary | ICD-10-CM | POA: Diagnosis not present

## 2023-02-08 NOTE — Progress Notes (Signed)
Attempted to obtain medical history via telephone, unable to reach at this time. HIPAA compliant voicemail message left requesting return call to pre surgical testing department. 

## 2023-02-09 ENCOUNTER — Other Ambulatory Visit: Payer: Self-pay

## 2023-02-09 DIAGNOSIS — N3941 Urge incontinence: Secondary | ICD-10-CM

## 2023-02-10 ENCOUNTER — Other Ambulatory Visit: Payer: Medicare Other

## 2023-02-10 DIAGNOSIS — N3941 Urge incontinence: Secondary | ICD-10-CM

## 2023-02-10 DIAGNOSIS — Z23 Encounter for immunization: Secondary | ICD-10-CM | POA: Diagnosis not present

## 2023-02-10 DIAGNOSIS — R2681 Unsteadiness on feet: Secondary | ICD-10-CM | POA: Diagnosis not present

## 2023-02-10 DIAGNOSIS — M6281 Muscle weakness (generalized): Secondary | ICD-10-CM | POA: Diagnosis not present

## 2023-02-11 LAB — URINALYSIS, ROUTINE W REFLEX MICROSCOPIC
Bilirubin Urine: NEGATIVE
Ketones, ur: NEGATIVE
Nitrite: POSITIVE — AB
Squamous Epithelial / HPF: NONE SEEN /HPF (ref <=5)
pH: 6.5 (ref 5.0–8.0)

## 2023-02-11 LAB — EXTRA URINE SPECIMEN

## 2023-02-12 ENCOUNTER — Telehealth: Payer: Self-pay | Admitting: Internal Medicine

## 2023-02-12 DIAGNOSIS — N390 Urinary tract infection, site not specified: Secondary | ICD-10-CM

## 2023-02-12 MED ORDER — NITROFURANTOIN MONOHYD MACRO 100 MG PO CAPS
100.0000 mg | ORAL_CAPSULE | Freq: Two times a day (BID) | ORAL | 0 refills | Status: DC
Start: 2023-02-12 — End: 2023-03-09

## 2023-02-12 NOTE — Telephone Encounter (Signed)
Attempted to call the patient twice to let her know about the results of her urinalysis. Both attempts went to voicemail. I then called her daughter [name] [name] to let her know that the patient has an UTI. Dr. [name] states that she will let her mom know. I have sent in nitrofurantoin 100 mg BID for 5 days for treatment of her UTI.

## 2023-02-13 LAB — URINALYSIS, ROUTINE W REFLEX MICROSCOPIC
Glucose, UA: NEGATIVE
Hgb urine dipstick: NEGATIVE
Hyaline Cast: NONE SEEN /LPF
Protein, ur: NEGATIVE
RBC / HPF: NONE SEEN /HPF (ref 0–2)
Specific Gravity, Urine: 1.016 (ref 1.001–1.035)
WBC, UA: >=60 /HPF — AB (ref 0–5)

## 2023-02-13 LAB — URINE CULTURE
MICRO NUMBER:: 15158872
SPECIMEN QUALITY:: ADEQUATE

## 2023-02-13 LAB — MICROSCOPIC MESSAGE

## 2023-02-15 ENCOUNTER — Encounter: Payer: Self-pay | Admitting: Internal Medicine

## 2023-02-16 ENCOUNTER — Telehealth: Payer: Self-pay | Admitting: Internal Medicine

## 2023-02-16 ENCOUNTER — Encounter: Payer: Self-pay | Admitting: Internal Medicine

## 2023-02-16 NOTE — Telephone Encounter (Signed)
Paperwork received from the [facility] for diabetic shoes.Please fax completed forms to [phone].   Forms placed in provider's box up front.

## 2023-02-17 NOTE — Telephone Encounter (Signed)
Form given to Dr. Lawerance Bach to sign.  Will fax back when completed

## 2023-02-18 ENCOUNTER — Encounter: Payer: Self-pay | Admitting: Internal Medicine

## 2023-02-18 NOTE — Telephone Encounter (Signed)
Form faxed today

## 2023-02-22 ENCOUNTER — Ambulatory Visit (HOSPITAL_BASED_OUTPATIENT_CLINIC_OR_DEPARTMENT_OTHER): Payer: Medicare Other

## 2023-02-22 ENCOUNTER — Ambulatory Visit (HOSPITAL_COMMUNITY): Payer: Medicare Other

## 2023-02-22 ENCOUNTER — Other Ambulatory Visit: Payer: Self-pay

## 2023-02-22 ENCOUNTER — Encounter (HOSPITAL_COMMUNITY)
Admission: RE | Disposition: A | Discharge status: Home / Self Care | Payer: Self-pay | Source: Home / Self Care | Attending: Internal Medicine

## 2023-02-22 ENCOUNTER — Encounter (HOSPITAL_COMMUNITY): Payer: Self-pay | Admitting: Internal Medicine

## 2023-02-22 ENCOUNTER — Ambulatory Visit (HOSPITAL_COMMUNITY)
Admission: RE | Admit: 2023-02-22 | Discharge: 2023-02-22 | Disposition: A | Discharge status: Home / Self Care | Payer: Medicare Other | Attending: Internal Medicine | Admitting: Internal Medicine

## 2023-02-22 DIAGNOSIS — Z4689 Encounter for fitting and adjustment of other specified devices: Secondary | ICD-10-CM | POA: Diagnosis not present

## 2023-02-22 DIAGNOSIS — D649 Anemia, unspecified: Secondary | ICD-10-CM | POA: Diagnosis not present

## 2023-02-22 DIAGNOSIS — Z9049 Acquired absence of other specified parts of digestive tract: Secondary | ICD-10-CM | POA: Diagnosis not present

## 2023-02-22 DIAGNOSIS — F419 Anxiety disorder, unspecified: Secondary | ICD-10-CM | POA: Insufficient documentation

## 2023-02-22 DIAGNOSIS — E785 Hyperlipidemia, unspecified: Secondary | ICD-10-CM | POA: Diagnosis not present

## 2023-02-22 DIAGNOSIS — I10 Essential (primary) hypertension: Secondary | ICD-10-CM | POA: Diagnosis not present

## 2023-02-22 DIAGNOSIS — Z7984 Long term (current) use of oral hypoglycemic drugs: Secondary | ICD-10-CM | POA: Insufficient documentation

## 2023-02-22 DIAGNOSIS — Z4659 Encounter for fitting and adjustment of other gastrointestinal appliance and device: Secondary | ICD-10-CM | POA: Insufficient documentation

## 2023-02-22 DIAGNOSIS — Z9689 Presence of other specified functional implants: Secondary | ICD-10-CM | POA: Diagnosis not present

## 2023-02-22 DIAGNOSIS — G4733 Obstructive sleep apnea (adult) (pediatric): Secondary | ICD-10-CM

## 2023-02-22 DIAGNOSIS — G4730 Sleep apnea, unspecified: Secondary | ICD-10-CM | POA: Insufficient documentation

## 2023-02-22 DIAGNOSIS — Z86718 Personal history of other venous thrombosis and embolism: Secondary | ICD-10-CM | POA: Diagnosis not present

## 2023-02-22 DIAGNOSIS — K219 Gastro-esophageal reflux disease without esophagitis: Secondary | ICD-10-CM | POA: Diagnosis not present

## 2023-02-22 DIAGNOSIS — M1990 Unspecified osteoarthritis, unspecified site: Secondary | ICD-10-CM | POA: Diagnosis not present

## 2023-02-22 DIAGNOSIS — E1140 Type 2 diabetes mellitus with diabetic neuropathy, unspecified: Secondary | ICD-10-CM | POA: Insufficient documentation

## 2023-02-22 DIAGNOSIS — Z79899 Other long term (current) drug therapy: Secondary | ICD-10-CM | POA: Diagnosis not present

## 2023-02-22 DIAGNOSIS — F32A Depression, unspecified: Secondary | ICD-10-CM | POA: Diagnosis not present

## 2023-02-22 DIAGNOSIS — K839 Disease of biliary tract, unspecified: Secondary | ICD-10-CM | POA: Diagnosis not present

## 2023-02-22 DIAGNOSIS — Z794 Long term (current) use of insulin: Secondary | ICD-10-CM | POA: Insufficient documentation

## 2023-02-22 HISTORY — PX: ENDOSCOPIC RETROGRADE CHOLANGIOPANCREATOGRAPHY (ERCP) WITH PROPOFOL: SHX5810

## 2023-02-22 HISTORY — PX: GASTROINTESTINAL STENT REMOVAL: SHX6384

## 2023-02-22 LAB — GLUCOSE, CAPILLARY: Glucose-Capillary: 83 mg/dL (ref 70–99)

## 2023-02-22 SURGERY — ENDOSCOPIC RETROGRADE CHOLANGIOPANCREATOGRAPHY (ERCP) WITH PROPOFOL
Anesthesia: General

## 2023-02-22 MED ORDER — FENTANYL CITRATE (PF) 100 MCG/2ML IJ SOLN
INTRAMUSCULAR | Status: AC
  Filled 2023-02-22: qty 2

## 2023-02-22 MED ORDER — ROCURONIUM BROMIDE 100 MG/10ML IV SOLN
INTRAVENOUS | Status: DC | PRN
  Administered 2023-02-22: 50 mg via INTRAVENOUS

## 2023-02-22 MED ORDER — SODIUM CHLORIDE 0.9 % IV SOLN
INTRAVENOUS | Status: DC | PRN
  Administered 2023-02-22: 30 mL

## 2023-02-22 MED ORDER — SUGAMMADEX SODIUM 200 MG/2ML IV SOLN
INTRAVENOUS | Status: DC | PRN
  Administered 2023-02-22: 200 mg via INTRAVENOUS

## 2023-02-22 MED ORDER — SODIUM CHLORIDE 0.9 % IV SOLN
1.5000 g | Freq: Once | INTRAVENOUS | Status: DC
  Filled 2023-02-22: qty 4

## 2023-02-22 MED ORDER — DICLOFENAC SUPPOSITORY 100 MG
RECTAL | Status: AC
  Filled 2023-02-22: qty 1

## 2023-02-22 MED ORDER — DICLOFENAC SUPPOSITORY 100 MG: 100.0000 mg | Freq: Once | RECTAL | Status: DC

## 2023-02-22 MED ORDER — LACTATED RINGERS IV SOLN: INTRAVENOUS | Status: DC

## 2023-02-22 MED ORDER — LIDOCAINE HCL (CARDIAC) PF 100 MG/5ML IV SOSY
PREFILLED_SYRINGE | INTRAVENOUS | Status: DC | PRN
  Administered 2023-02-22: 80 mg via INTRAVENOUS

## 2023-02-22 MED ORDER — DICLOFENAC SUPPOSITORY 100 MG
RECTAL | Status: DC | PRN
  Administered 2023-02-22: 100 mg via RECTAL

## 2023-02-22 MED ORDER — GLUCAGON HCL RDNA (DIAGNOSTIC) 1 MG IJ SOLR
INTRAMUSCULAR | Status: AC
  Filled 2023-02-22: qty 1

## 2023-02-22 MED ORDER — DEXAMETHASONE SODIUM PHOSPHATE 10 MG/ML IJ SOLN
INTRAMUSCULAR | Status: DC | PRN
  Administered 2023-02-22: 8 mg via INTRAVENOUS

## 2023-02-22 MED ORDER — PROPOFOL 10 MG/ML IV BOLUS
INTRAVENOUS | Status: DC | PRN
  Administered 2023-02-22: 10 mg via INTRAVENOUS
  Administered 2023-02-22: 120 mg via INTRAVENOUS

## 2023-02-22 MED ORDER — PROPOFOL 10 MG/ML IV BOLUS
INTRAVENOUS | Status: AC
  Filled 2023-02-22: qty 20

## 2023-02-22 MED ORDER — SODIUM CHLORIDE 0.9 % IV SOLN: INTRAVENOUS | Status: DC

## 2023-02-22 MED ORDER — ONDANSETRON HCL 4 MG/2ML IJ SOLN
INTRAMUSCULAR | Status: DC | PRN
Start: 2023-02-22 — End: 2023-02-22
  Administered 2023-02-22 (×2): 4 mg via INTRAVENOUS

## 2023-02-22 MED ORDER — CIPROFLOXACIN IN D5W 400 MG/200ML IV SOLN
INTRAVENOUS | Status: DC | PRN
  Administered 2023-02-22: 400 mg via INTRAVENOUS

## 2023-02-22 MED ORDER — FENTANYL CITRATE (PF) 100 MCG/2ML IJ SOLN
INTRAMUSCULAR | Status: DC | PRN
  Administered 2023-02-22: 25 ug via INTRAVENOUS
  Administered 2023-02-22: 50 ug via INTRAVENOUS

## 2023-02-22 MED ORDER — CIPROFLOXACIN IN D5W 400 MG/200ML IV SOLN
INTRAVENOUS | Status: AC
  Filled 2023-02-22: qty 200

## 2023-02-22 NOTE — Op Note (Addendum)
[facility] Patient Name: [name] [name] Procedure Date: 02/22/2023 MRN: [id number] Attending MD: Iva Boop , MD, [id number] Date of Birth: 1946/07/28 CSN: [id number] Age: 77 Admit Type: Outpatient Procedure:                ERCP Indications:              Biliary stent removal Providers:                Iva Boop, MD, Eliberto Ivory, RN, Kandice Robinsons, Technician Referring MD:              Medicines:                Monitored Anesthesia Care, Cipro 400 mg IV,                            diclofenac 100 mg per rectum Complications:            No immediate complications. Estimated Blood Loss:     Estimated blood loss: none. Procedure:                Pre-Anesthesia Assessment:                           - Prior to the procedure, a History and Physical                            was performed, and patient medications and                            allergies were reviewed. The patient's tolerance of                            previous anesthesia was also reviewed. The risks                            and benefits of the procedure and the sedation                            options and risks were discussed with the patient.                            All questions were answered, and informed consent                            was obtained. Prior Anticoagulants: The patient has                            taken no anticoagulant or antiplatelet agents. ASA                            Grade Assessment: III - A patient with severe  systemic disease. After reviewing the risks and                            benefits, the patient was deemed in satisfactory                            condition to undergo the procedure.                           After obtaining informed consent, the scope was                            passed under direct vision. Throughout the                            procedure, the patient's blood pressure,  pulse, and                            oxygen saturations were monitored continuously. The                            [employer] D single use                            duodenoscope was introduced through the mouth, and                            used to inject contrast into and used to inject                            contrast into the bile duct. The ERCP was                            accomplished without difficulty. The patient                            tolerated the procedure well. Scope In: Scope Out: Findings:      A scout film of the abdomen was obtained. Stent(s) and surgical clips       consistent with a previous cholecystectomy were seen. The esophagus was       successfully intubated under direct vision without detailed examination       of the pharynx, larynx, and associated structures, and upper GI tract.       The upper GI tract was grossly normal. esophagus not seen well with       side-veiew scope. One plastic stent originating in the biliary tree was       emerging from the major papilla. The biliary stent was removed using a       polyp snare. Bile duct then cannulated with wire and sphincterotome.       Deep cannulation obtained. The biliary tree was filled with contrast and       a normal post-cholecystectomy biliary tree was seen. No stones and no       leak. Procedure terminated and no pancreatogram obtained by intent. Impression:               -  One stent from the biliary tree was seen in the                            major papilla. Normal post-cholecystectomy biliary                            tree - no leak or stones. Moderate Sedation:      Not Applicable - Patient had care per Anesthesia. Recommendation:           - Liquid diet today - clears first and if ok later                            then full liquids, normal diet tomorrow.                           Keep GI f/u Dr. Leonides Schanz 9/24                           F/U Dr. Andrey Campanile of [facility] as  planned Procedure Code(s):        --- Professional ---                           [phone], Endoscopic retrograde                            cholangiopancreatography (ERCP); with removal of                            foreign body(s) or stent(s) from biliary/pancreatic                            duct(s) Diagnosis Code(s):        --- Professional ---                           Z96.89, Presence of other specified functional                            implants                           Z46.59, Encounter for fitting and adjustment of                            other gastrointestinal appliance and device CPT copyright 2022 [employer]. All rights reserved. The codes documented in this report are preliminary and upon coder review may  be revised to meet current compliance requirements. Iva Boop, MD 02/22/2023 9:11:07 AM This report has been signed electronically. Number of Addenda: 0

## 2023-02-22 NOTE — Anesthesia Preprocedure Evaluation (Addendum)
Anesthesia Evaluation  Patient identified by MRN, date of birth, ID band Patient awake    Reviewed: Allergy & Precautions, NPO status , Patient's Chart, lab work & pertinent test results, reviewed documented beta blocker date and time   Airway Mallampati: III  TM Distance: >3 FB Neck ROM: Full    Dental  (+) Teeth Intact, Dental Advisory Given, Caps,    Pulmonary sleep apnea and Continuous Positive Airway Pressure Ventilation    Pulmonary exam normal breath sounds clear to auscultation       Cardiovascular hypertension, Pt. on home beta blockers and Pt. on medications + DVT  Normal cardiovascular exam+ dysrhythmias (RBBB)  Rhythm:Regular Rate:Normal     Neuro/Psych  PSYCHIATRIC DISORDERS Anxiety Depression     Neuromuscular disease    GI/Hepatic ,GERD  Medicated,,Bile leak; Stent removal   Endo/Other  diabetes, Type 2, Insulin Dependent, Oral Hypoglycemic Agents    Renal/GU negative Renal ROS     Musculoskeletal  (+) Arthritis ,    Abdominal   Peds  Hematology negative hematology ROS (+)   Anesthesia Other Findings Day of surgery medications reviewed with the patient.  Reproductive/Obstetrics                             Anesthesia Physical Anesthesia Plan  ASA: 3  Anesthesia Plan: General   Post-op Pain Management:    Induction: Intravenous  PONV Risk Score and Plan: 3 and Treatment may vary due to age or medical condition, Dexamethasone and Ondansetron  Airway Management Planned: Oral ETT  Additional Equipment:   Intra-op Plan:   Post-operative Plan: Extubation in OR  Informed Consent: I have reviewed the patients History and Physical, chart, labs and discussed the procedure including the risks, benefits and alternatives for the proposed anesthesia with the patient or authorized representative who has indicated his/her understanding and acceptance.     Dental advisory  given  Plan Discussed with: CRNA and Anesthesiologist  Anesthesia Plan Comments:        Anesthesia Quick Evaluation

## 2023-02-22 NOTE — Progress Notes (Signed)
Pt has some bruising on left forearm that was not present prior to procedure.  Skin is intact.

## 2023-02-22 NOTE — Discharge Instructions (Addendum)
The stent was removed and dye was injected into the bile duct. There were no signs of a leak as before. All looks ok.  I hope you continue to feel well in this regard.  Keep your follow-up with Dr. Leonides Schanz that is scheduled for December.  I appreciate the opportunity to care for you. Iva Boop, MD, FACG   YOU HAD AN ENDOSCOPIC PROCEDURE TODAY: Refer to the procedure report and other information in the discharge instructions given to you for any specific questions about what was found during the examination. If this information does not answer your questions, please call Dr. Marvell Fuller office at [phone] to clarify.   YOU SHOULD EXPECT: Some feelings of bloating in the abdomen. Passage of more gas than usual. Walking can help get rid of the air that was put into your GI tract during the procedure and reduce the bloating. If you had a lower endoscopy (such as a colonoscopy or flexible sigmoidoscopy) you may notice spotting of blood in your stool or on the toilet paper. Some abdominal soreness may be present for a day or two, also.  DIET: Clear liquids through lunch today and if feeling ok may try other liquids today and move to solid food tomorrow.  Drink plenty of fluids but you should avoid alcoholic beverages for 24 hours.   ACTIVITY: Your care partner should take you home directly after the procedure. You should plan to take it easy, moving slowly for the rest of the day. You can resume normal activity the day after the procedure however YOU SHOULD NOT DRIVE, use power tools, machinery or perform tasks that involve climbing or major physical exertion for 24 hours (because of the sedation medicines used during the test).   SYMPTOMS TO REPORT IMMEDIATELY: A gastroenterologist can be reached at any hour. Please call [phone]  for any of the following symptoms:   Following upper endoscopy (EGD, EUS, ERCP, esophageal dilation) Vomiting of blood or coffee ground material  New,  significant abdominal pain  New, significant chest pain or pain under the shoulder blades  Painful or persistently difficult swallowing  New shortness of breath  Black, tarry-looking or red, bloody stools .

## 2023-02-22 NOTE — Transfer of Care (Signed)
Immediate Anesthesia Transfer of Care Note  Patient: [name] [name]  Procedure(s) Performed: ENDOSCOPIC RETROGRADE CHOLANGIOPANCREATOGRAPHY (ERCP) WITH PROPOFOL GASTROINTESTINAL STENT REMOVAL  Patient Location: PACU and Endoscopy Unit  Anesthesia Type:General  Level of Consciousness: awake, alert , oriented, and patient cooperative  Airway & Oxygen Therapy: Patient Spontanous Breathing and Patient connected to face mask oxygen  Post-op Assessment: Report given to RN and Post -op Vital signs reviewed and stable  Post vital signs: Reviewed and stable  Last Vitals:  Vitals Value Taken Time  BP 152/52 02/22/23 0915  Temp 36.3 C 02/22/23 0915  Pulse 71 02/22/23 0918  Resp 16 02/22/23 0918  SpO2 100 % 02/22/23 0918  Vitals shown include unfiled device data.  Last Pain:  Vitals:   02/22/23 0915  TempSrc: Temporal  PainSc:          Complications: No notable events documented.

## 2023-02-22 NOTE — Anesthesia Postprocedure Evaluation (Signed)
Anesthesia Post Note  Patient: [name] [name]  Procedure(s) Performed: ENDOSCOPIC RETROGRADE CHOLANGIOPANCREATOGRAPHY (ERCP) WITH PROPOFOL GASTROINTESTINAL STENT REMOVAL     Patient location during evaluation: Endoscopy Anesthesia Type: General Level of consciousness: awake and alert Pain management: pain level controlled Vital Signs Assessment: post-procedure vital signs reviewed and stable Respiratory status: spontaneous breathing, nonlabored ventilation, respiratory function stable and patient connected to nasal cannula oxygen Cardiovascular status: blood pressure returned to baseline and stable Postop Assessment: no apparent nausea or vomiting Anesthetic complications: no   No notable events documented.  Last Vitals:  Vitals:   02/22/23 0931 02/22/23 0940  BP: (!) 161/67 (!) 170/63  Pulse: 71 74  Resp: 13 19  Temp:    SpO2: 100% 100%    Last Pain:  Vitals:   02/22/23 0931  TempSrc:   PainSc: 0-No pain                 Collene Schlichter

## 2023-02-22 NOTE — H&P (Signed)
[facility] History and Physical   Primary Care Physician:  Pincus Sanes, MD   Reason for Procedure:   Bile duct stent removal  Plan:    ERCP     HPI: [name] [name] is a 77 y.o. female presenting for removal of CBD stent placed at ERCP 12/28/22 to treat a post lap chole bilel eak. Abdominal drain is out and she feels well. LFT's NL when last checked   Past Medical History:  Diagnosis Date   Anemia    Anxiety    Arthritis    Closed fracture of fifth metatarsal bone 05/13/2018   Deep vein thrombosis (HCC)    right calf - 05/2012    Depression    Diabetes mellitus without complication (HCC)    diet controlled    GERD (gastroesophageal reflux disease)    Hyperlipidemia    Patient denies.   Hypertension    Ejection fraction =>55% Left ventricular systolic function is normal. Left ventricular wall motion is normal     Lymphocytic colitis    Neuropathy    diabetic - in bilateral feet    Pityriasis lichenoides chronica    Pneumonia    Sleep apnea    bipap    Past Surgical History:  Procedure Laterality Date   BILIARY STENT PLACEMENT  12/28/2022   Procedure: BILIARY STENT PLACEMENT;  Surgeon: Iva Boop, MD;  Location: [facility] ENDOSCOPY;  Service: Gastroenterology;;   BIOPSY  11/09/2022   Procedure: BIOPSY;  Surgeon: Lemar Lofty., MD;  Location: [facility]OSCOPY;  Service: Gastroenterology;;   CHOLECYSTECTOMY N/A 12/23/2022   Procedure: LAPAROSCOPIC CHOLECYSTECTOMY WITH ICG DYE;  Surgeon: Gaynelle Adu, MD;  Location: [facility] ORS;  Service: General;  Laterality: N/A;   DILATION AND CURETTAGE OF UTERUS     ENDOSCOPIC RETROGRADE CHOLANGIOPANCREATOGRAPHY (ERCP) WITH PROPOFOL N/A 12/28/2022   Procedure: ENDOSCOPIC RETROGRADE CHOLANGIOPANCREATOGRAPHY (ERCP) WITH PROPOFOL;  Surgeon: Iva Boop, MD;  Location: [facility] ENDOSCOPY;  Service: Gastroenterology;  Laterality: N/A;   ESOPHAGOGASTRODUODENOSCOPY N/A 11/09/2022   Procedure: ESOPHAGOGASTRODUODENOSCOPY (EGD);   Surgeon: Lemar Lofty., MD;  Location: [facility] ENDOSCOPY;  Service: Gastroenterology;  Laterality: N/A;   EYE SURGERY  07/2020   HAMMER TOE SURGERY     right hand surgery      due to blood infection    torn meniscus repair      right knee    TOTAL HIP ARTHROPLASTY  09/13/2012   Procedure: TOTAL HIP ARTHROPLASTY ANTERIOR APPROACH;  Surgeon: Shelda Pal, MD;  Location: [facility] ORS;  Service: Orthopedics;  Laterality: Right;   UPPER ESOPHAGEAL ENDOSCOPIC ULTRASOUND (EUS) N/A 11/09/2022   Procedure: UPPER ESOPHAGEAL ENDOSCOPIC ULTRASOUND (EUS);  Surgeon: Lemar Lofty., MD;  Location: [facility] ENDOSCOPY;  Service: Gastroenterology;  Laterality: N/A;    Prior to Admission medications   Medication Sig Start Date End Date Taking? Authorizing Provider  acetaminophen (TYLENOL) 500 MG tablet Take 1 tablet (500 mg total) by mouth every 6 (six) hours as needed for mild pain or fever. 12/30/22  Yes Juliet Rude, PA-C  Biotin 5000 MCG CAPS Take 5,000 mcg by mouth daily.   Yes [provider]  carvedilol (COREG) 6.25 MG tablet Take 6.25 mg by mouth 2 (two) times daily. 12/26/22  Yes [provider]  cholecalciferol (VITAMIN D3) 25 MCG (1000 UNIT) tablet Take 1,000 Units by mouth daily.   Yes [provider]  cyanocobalamin (VITAMIN B12) 1000 MCG tablet Take 1,000 mcg by mouth daily.   Yes [provider]  cyclobenzaprine (FLEXERIL) 10 MG tablet TAKE 1 TABLET BY MOUTH AT BEDTIME 09/07/22  Yes Burns, Bobette Mo, MD  folic acid (FOLVITE) 1 MG tablet Take 1 mg by mouth daily.   Yes [provider]  insulin glargine (LANTUS SOLOSTAR) 100 UNIT/ML Solostar Pen Inject 20 Units into the skin 2 (two) times daily. 02/02/23  Yes Burns, Bobette Mo, MD  lisinopril (ZESTRIL) 20 MG tablet Take 1 tablet (20 mg total) by mouth daily. 10/20/21  Yes Lennette Bihari, MD  loperamide (IMODIUM) 2 MG capsule Take 2 mg by mouth daily.   Yes [provider]  melatonin 5 MG TABS Take 5  mg by mouth at bedtime.   Yes [provider]  metFORMIN (GLUCOPHAGE-XR) 500 MG 24 hr tablet TAKE 1 TABLET BY MOUTH TWICE A DAY 07/24/22  Yes Burns, Bobette Mo, MD  methotrexate (RHEUMATREX) 2.5 MG tablet Take 20 mg by mouth every Sunday at 6pm. 06/24/16  Yes [provider]  nitrofurantoin, macrocrystal-monohydrate, (MACROBID) 100 MG capsule Take 1 capsule (100 mg total) by mouth 2 (two) times daily. 02/12/23  Yes Imogene Burn, MD  omeprazole (PRILOSEC) 40 MG capsule Take 1 capsule (40 mg total) by mouth daily. 01/14/23  Yes Imogene Burn, MD  oxyCODONE-acetaminophen (PERCOCET) 10-325 MG tablet Take 1 tablet by mouth every 6 (six) hours as needed for pain. 12/30/22  Yes Trixie Deis R, PA-C  polyethylene glycol (MIRALAX / GLYCOLAX) 17 g packet Take 17 g by mouth daily as needed for moderate constipation.   Yes [provider]  potassium chloride (KLOR-CON M) 10 MEQ tablet Take 2 tablets (20 mEq total) by mouth daily. 09/18/22  Yes Burns, Bobette Mo, MD  Probiotic Product (PROBIOTIC DAILY PO) Take 1 capsule by mouth daily.   Yes [provider]  rosuvastatin (CRESTOR) 5 MG tablet TAKE 1 TABLET(5 MG) BY MOUTH DAILY Patient taking differently: Take 5 mg by mouth every evening. 03/19/22  Yes Burns, Bobette Mo, MD  sertraline (ZOLOFT) 100 MG tablet Take 2 tablets (200 mg total) by mouth daily. 01/22/23  Yes Burns, Bobette Mo, MD  sucralfate (CARAFATE) 1 GM/10ML suspension Take 10 mLs (1 g total) by mouth 4 (four) times daily -  with meals and at bedtime. 12/30/22  Yes Juliet Rude, PA-C  fluconazole (DIFLUCAN) 150 MG tablet 1 tab by mouth every 3 days as needed Patient not taking: Reported on 02/18/2023 01/28/23   Corwin Levins, MD    Current Facility-Administered Medications  Medication Dose Route Frequency Provider Last Rate Last Admin   lactated ringers infusion   Intravenous Continuous Iva Boop, MD 10 mL/hr at 02/22/23 0807 New Bag at 02/22/23 0807    Allergies as of  12/31/2022 - Review Complete 12/31/2022  Allergen Reaction Noted   Other Shortness Of Breath 10/06/2014   Thorazine [chlorpromazine] Other (See Comments) 05/26/2021   Bee venom Swelling 05/01/2018   Farxiga [dapagliflozin] Swelling 10/24/2019   Semaglutide  12/27/2020   Sulfa antibiotics Hives 09/06/2012    Family History  Problem Relation Age of Onset   Hypertension Mother     Social History   Socioeconomic History   Marital status: Divorced    Spouse name: Not on file   Number of children: Not on file   Years of education: Not on file   Highest education level: Not on file  Occupational History   Not on file  Tobacco Use   Smoking status: Never   Smokeless tobacco: Never  Substance and  Sexual Activity   Alcohol use: Yes    Comment: occasional wine    Drug use: No   Sexual activity: Not on file  Other Topics Concern   Not on file  Social History Narrative   She is widowed. She has 2 children, 2 grandchildren. She does drink occasional alcohol . There is no tobacco . She does not have a routine exercise program   Social Determinants of Health   Financial Resource Strain: Low Risk  (10/01/2022)   Overall Financial Resource Strain (CARDIA)    Difficulty of Paying Living Expenses: Not hard at all  Food Insecurity: No Food Insecurity (12/26/2022)   Hunger Vital Sign    Worried About Running Out of Food in the Last Year: Never true    Ran Out of Food in the Last Year: Never true  Transportation Needs: No Transportation Needs (12/26/2022)   PRAPARE - Administrator, Civil Service (Medical): No    Lack of Transportation (Non-Medical): No  Physical Activity: Insufficiently Active (10/01/2022)   Exercise Vital Sign    Days of Exercise per Week: 2 days    Minutes of Exercise per Session: 60 min  Stress: No Stress Concern Present (10/01/2022)   [employer] of Occupational Health - Occupational Stress Questionnaire    Feeling of Stress : Not at all  Social  Connections: Socially Integrated (10/01/2022)   Social Connection and Isolation Panel [NHANES]    Frequency of Communication with Friends and Family: Twice a week    Frequency of Social Gatherings with Friends and Family: Twice a week    Attends Religious Services: More than 4 times per year    Active Member of [employer] or Organizations: Yes    Attends Banker Meetings: More than 4 times per year    Marital Status: Living with partner  Intimate Partner Violence: Not At Risk (12/26/2022)   Humiliation, Afraid, Rape, and Kick questionnaire    Fear of Current or Ex-Partner: No    Emotionally Abused: No    Physically Abused: No    Sexually Abused: No    Review of Systems:  All other review of systems negative except as mentioned in the HPI.  Physical Exam: Vital signs BP (!) 145/57   Pulse 70   Temp (!) 97.5 F (36.4 C) (Temporal)   Resp 12   Ht 5\' 6"  (1.676 m)   Wt 87.5 kg   SpO2 100%   BMI 31.15 kg/m   General:   Alert,  Well-developed, well-nourished, pleasant and cooperative in NAD Lungs:  Clear throughout to auscultation.   Heart:  Regular rate and rhythm; no murmurs, clicks, rubs,  or gallops. Abdomen:  Soft, nontender and nondistended. Normal bowel sounds.   Neuro/Psych:  Alert and cooperative. Normal mood and affect. A and O x 3   @Ninnie Fein  Sena Slate, MD, [facility] [phone] (pager) 02/22/2023 8:08 AM@

## 2023-02-22 NOTE — Anesthesia Procedure Notes (Signed)
Procedure Name: Intubation Date/Time: 02/22/2023 8:20 AM  Performed by: Maurene Capes, CRNAPre-anesthesia Checklist: Patient identified, Emergency Drugs available, Suction available, Patient being monitored and Timeout performed Patient Re-evaluated:Patient Re-evaluated prior to induction Oxygen Delivery Method: Circle system utilized Preoxygenation: Pre-oxygenation with 100% oxygen Induction Type: IV induction Ventilation: Mask ventilation without difficulty Laryngoscope Size: Mac and 4 Grade View: Grade III Tube type: Oral Tube size: 7.0 mm Number of attempts: 1 Placement Confirmation: ETT inserted through vocal cords under direct vision, positive ETCO2, breath sounds checked- equal and bilateral and CO2 detector Secured at: 22 cm Dental Injury: Teeth and Oropharynx as per pre-operative assessment

## 2023-02-23 ENCOUNTER — Encounter: Payer: Self-pay | Admitting: Internal Medicine

## 2023-02-23 ENCOUNTER — Encounter (HOSPITAL_COMMUNITY): Payer: Self-pay | Admitting: Internal Medicine

## 2023-03-02 ENCOUNTER — Other Ambulatory Visit: Payer: Self-pay | Admitting: Internal Medicine

## 2023-03-03 ENCOUNTER — Ambulatory Visit: Payer: Medicare Other | Attending: Cardiovascular Disease | Admitting: Cardiovascular Disease

## 2023-03-03 ENCOUNTER — Encounter: Payer: Self-pay | Admitting: Cardiovascular Disease

## 2023-03-03 VITALS — BP 121/71 | HR 72 | Ht 66.0 in | Wt 201.0 lb

## 2023-03-03 DIAGNOSIS — L4050 Arthropathic psoriasis, unspecified: Secondary | ICD-10-CM

## 2023-03-03 DIAGNOSIS — I4510 Unspecified right bundle-branch block: Secondary | ICD-10-CM

## 2023-03-03 DIAGNOSIS — E66811 Obesity, class 1: Secondary | ICD-10-CM

## 2023-03-03 DIAGNOSIS — G4733 Obstructive sleep apnea (adult) (pediatric): Secondary | ICD-10-CM | POA: Diagnosis not present

## 2023-03-03 DIAGNOSIS — E6609 Other obesity due to excess calories: Secondary | ICD-10-CM | POA: Diagnosis not present

## 2023-03-03 DIAGNOSIS — I10 Essential (primary) hypertension: Secondary | ICD-10-CM

## 2023-03-03 DIAGNOSIS — E785 Hyperlipidemia, unspecified: Secondary | ICD-10-CM | POA: Diagnosis not present

## 2023-03-03 DIAGNOSIS — Z6832 Body mass index (BMI) 32.0-32.9, adult: Secondary | ICD-10-CM | POA: Insufficient documentation

## 2023-03-03 NOTE — Progress Notes (Signed)
Patient ID: [name] [name], female   DOB: [birth year], 77 y.o.   MRN: [id number]        HPI: [name] [name] is a 77 y.o. female percents in the office today for a 5 month followup sleep/cardiology evaluation.  Ms. Buckel is a retired [employer] who has a history of hypertension, obesity, psoriatic arthritis, DVT, which occurred while traveling to [place], as well as complex obstructive sleep apnea.  She has had difficulty in the past with CPAP therapy. She has been able to tolerate BiPAP and has had a Respironics BiPAP Auto unit well but also has had difficulty with some of her high pressure requirements. In April 2014 I changed her maximum BiPAP pressure to 19 and her maximum EPAP pressure to 15. I also reduced her minimum EPAP pressure to 6 and her minimal IPAP pressure to 10 with pressure support of 4 and changed her Bi_Flex to 3. She has been followed by [facility].  Clinically, she had felt markedly improved with BiPAP.  Compared to prior CPAP therapy.  She has psoriatic arthritis and is on Humira as well as methotrexate followed by rheumatology.  She also has a Charcot joint in her right foot.   She is on Cymbalta for pain relief relative to this. She does not routinely exercise.  She keeps busy moving around, but typically does not do aerobic activity.  She has not been successful with significant weight loss.   She recently, she has been taking lisinopril HCTZ 20/12.5 mg for hypertension.  She had experienced an episode of chest discomfort which she felt was like a cramp and she noticed this most when standing up, but was associated with diaphoresis and shortness of breath.  She does have family history for coronary artery disease both in her mother as well as in her maternal grandmother. She has not been able to be very active due to her Charcot foot.  She also had noticed some mild shortness of breath with activity.  She eventually was referred for a nuclear perfusion study on 01/31/2015 which  revealed normal perfusion and function; ejection fraction 64%.  When I saw her in February 2017, her BiPAP machine had begun to fail.  She continues to use CPAP with 100% compliance and cannot sleep without it.  She typically sleeps for at least 8 hours per night.  She had taken her BiPAP machine to [facility] who stated that her machine essentially had stopped working and is in need for new machine.   She received a new Respironics Dream station auto BiPAP unit in April 2017.  She uses a Field seismologist FX full face mask, medium size.  She feels that the machine has been working well but she has had difficulty with her sleep.  At times she feels that she is not getting enough pressure.  Previously she had been set on a BiPAP auto unit with an EPAP minimum of 8 and IPAP max of 19 , which had worked well.  I obtained a new download today from 02/16/2016 through 03/16/2016.  She is 100% compliant and is averaging 8 hours and 25 minutes of sleep.  Apparently, she is not been set on auto and has been on and IPAP of 14 and an EPAP of 10.  Her AHI is increased at 25.1. I changed her BiPAP mode from a fixed pressure to auto BiPAP initiating atenolol over 6 with potential titration up to 20/16.  I also allowed for pressure support to change from 4-6 as  needed.  In 2018, she felt well from a cardiac perspective, but has had significant infections including MRSA, strep throat, UTI, and she had fractured her right ankle.  As result, she has noticed some ankle swelling right greater than left.  She continues to use her BiPAP and believes she is feeling much better than she had in the past.  [employer] is her DME company.  A download in the office from 01/26/2017 through 04/25/2017.  She is 100% compliance.  She is averaging 8 hours and 36 minutes of sleep per night.  Her average device IPAP pressure was 18.2 and average device CPAP pressure greater than 90% of the time was 14.1.  AHI continued to be elevated at 21.2. An Epworth scale  score was calculated in the office today and this endorsed at 4, arguing against daytime sleepiness.  I last saw her, she was sleeping adequate duration.  Her AHI remains elevated I suggested that she reduce ramp time from 30 minutes down to 20 minutes and increased her starting pressure from 4 up to 6 cm.  I saw her in September 2019 at which time she had again started to notice  symptoms where she was waking up at night.  She noted leaks from her mask at higher pressures.  She feels that she is more  tired.  She had some swelling right lower extremity greater than left and she has a Charcot right foot.  She had broken her right ankle in August 2018.  She denied any chest pain.  She is unaware of any cardiac arrhythmias.  Her daughter, Dr. Tressia Danas is a GI physician who had been practicing in [facility] and will be moving up to work with [employer].  She is grateful that she will now have her twin granddaughters close by.  She was recently placed on Cimzia for her psoriatic arthritis to take in addition to her methotrexate.  She is followed by Dr. Zenovia Jordan for her rheumatologic issues.   When I evaluated her in December 2019 she wassleeping 8 hours 29 minutes and her average IPAP pressure greater than 90% 16.7 with an average EPAP pressure of 12.4.  I reduced her ramp time down to 20 minutes.  Having issues with her mask and I recommended a trial of the new DreamWear full facemask by either a Respironics or a ResMed.  Over the past several months, she has not been able to be active due to her fracture of her fifth metatarsal.  She was in a hard cast for 2 months with no weightbearing.  As result she has not been able to exercise.  She has gained weight.  Was just removed several weeks ago.  She admits to ankle swelling.  She continues to use her BiPAP.  A new download was obtained and she again is compliant.  Her average device IPAP pressure greater than 90% of the time was 18.1 with  a maximum titrated IPAP pressure of 20.  AHI remained elevated at 17.9.    She went to [place] in January 2020 and unfortunately became ill and spent the entire trip in the hospital due to ischemic colitis.  She was subsequently evaluated by Micah Flesher in a telemedicine visit in June 2020.  When I last evaluated her in a telemedicine visit in June 2021 she had not been able to do much walking because of tenderness from her bone on the plantar aspect of her foot.  She has fallen arches.  She  is being evaluated by Dr. Victorino Dike.  Around Thanksgiving 2020 both she and her husband both became ill with COVID-19 pneumonia and both were hospitalized for approximately a week.  She had temperatures of 103, she was treated with remdesivir, convalescent plasma in addition to steroids.  She has subsequently not regained her sense of taste or smell and has continued to experience fatigability.  During that time she was not able to use BiPAP.  Apparently she was having issues stating that Medicare is going to deny her BiPAP.  She has continued to use BiPAP therapy.  I attempted to try to get a download from her Resp[employer]ics website today but we were unable to do this in the office.  I have reached out to Claiborne Rigg who will contact [employer] to see if we can obtain her most recent download.  Ms. Bohlken continues to be on lisinopril/HCTZ for hypertension, rosuvastatin 5 mg for hyperlipidemia.  She was on methotrexate for rheumatoid arthritis but this was stopped approximately 6 weeks ago.  She is diabetic on metformin.     I scheduled her for a 2D echo Doppler study to reassess LV function following her Covid infection which was done on February 06, 2020.  LV function was excellent with EF of 65 to 70%.  There was grade 1 diastolic dysfunction.  There was mild mitral regurgitation.   I evaluated her in a telemedicine visit on May 24, 2020.  Since her last evaluation, her Respironics dream station has been on recall.  She has  registered with Resp[employer]ics and attempt to get a new machine.  She had called the office shortly thereafter wondering if she should still use therapy.  At that time with the severity of her sleep apnea and the incidence of only 0.3% of devices with problems I had recommended she continue therapy.  She uses [employer] as her DME company.  I asked her today if she had been contacted regarding insertion of a bacteria inlet filter device which she could place into her tubing but apparently this had not been done by [facility].  She feels well.  She again has been immobile and that her left leg is in a hard cast making it difficult to walk.  She does note some occasional ankle swelling.  She was wondering if she should continue with aspirin which she has been taking 81 mg 3 times a week.  She admits to being under increased stress since her husband unfortunately had a brain bleed but is starting to improve.  She has been on lisinopril HCT 20/25 mg.  She has continued to be on lipid-lowering therapy with rosuvastatin 5 mg and LDL cholesterol was 40.   I last saw her in August 2022.  She was ill in November 2021 with an intestinal virus, she required ICU she tells me she was cared for by Dr. Matthias Hughs.  He apparently had an extensive hospital stay.  She has had difficulty with long COVID resulting from her COVID infection in November 2020.  She recently has been under months of extensive care for her left foot ulcers requiring wound care visits for her previous infections which has resolved.  From a sleep perspective, she has had difficulty and recently adjustments were made to her dream station auto BiPAP therapy.  Her device is on recall and she is not been aware of any timeframe when potentially she can receive a new device.  Due to continued difficulty with her unit, approximately 1 month ago her pressures  her pressures were adjusted such that now her ramp time is set at 20 minutes, ramp start pressure 6, minimum EPAP 14,  maximum IPAP 25, with pressure support range of 3 to 8 cm of water.  Her most recent download shows 100% usage with average use at 9 hours and 49 minutes per night.  Her 90% pressures are 22.3/19.0 with maximum titrated pressures at 25/23.  Despite this average AHI continues to be elevated at 16.3.    I saw her on October 20, 2021.  3 days previously she had just  received the new Dream Station BiPAP replacement device.  Her old device had died and she had received a "loaner "unit from her DME company.  Had lost 45 pounds since October 2022.  At times she  experiences some mild lightheadedness.  She was unaware of palpitations.    I  saw her in May 18, 2022.  Last week she was diagnosed with COVID infection after her son visited her and has been receiving molnuparivir therapy.  She has been using her new DreamWear Respironics auto bilevel device.  However, the device had not been linked to our office.  A download was able to be obtained through the card.  It appears that her minimum EPAP is set at 9 with maximum IPAP 22 with a pressure support range of 0 to 5 cm.  Her humidification is at 3 and she does admit to dry mouth.  A download demonstrates increased AHI at 32.5 with device IPAP EPAP pressure greater than 90% of the time at 17.  As result it does not appear that there has been a pressure support and essentially for pressure support has been 0.  She does admit to some feet swelling.  She denied chest pain.  During that evaluation, I suggested changes be made to her device and recommended she be switched to a maximum IPAP setting of 25 and set her minimum EPAP pressure at 10 cm of water.  I also recommended she change pressure support to a range of 5 to 8 cm of water.  I last saw her on September 22, 2022 at which time she told me that her new Respironics machine had been recalled.   A new download was obtained today but unfortunately this did not provide her most recent data.  AHI was 32.5 and apparently  her 90% BiPAP and 90% EPAP were the same at 17.4.  During her evaluation today I suggested that she bring her machine to her office tomorrow so that we can take her card out to obtain new data since apparently we could not obtain that today.  This again showed an significant AHI elevation at 33.6 and it appears that her IPAP and EPAP are similar in it is not utilizing the BiPAP pressures that were recommended.  Her maximum titrated IPAP and EPAP pressures are 22 and 90th percentile pressures were 17.9 and 17.8, respectively.  She continues to use therapy for the nights duration averaging 9 hours and 26 minutes.  She continues to be on carvedilol 6.25 mg twice a day, HCTZ 12.5 mg as needed, lisinopril 20 mg daily for hypertension.  She is diabetic on metformin 500 mg twice a day.  She is on rosuvastatin 5 mg daily for hyperlipidemia.  LDL cholesterol on September 17, 2022 was 57.  During that evaluation I recommended that she undergo a BiPAP/possible ASV since the titration prior to receiving a new device.  Ms. Sigman underwent a BiPAP titration on  October 14, 2022.  BiPAP was initiated 8/4 and titrated to maximum optimal pressure 22/18.  AHI was 0, RDI 6.6 and O2 nadir at 93%.  She did not achieve any REM sleep being on the 16/12 titration.  She was not transition to ASV.  There is still received a new ResMed air curve 10 via auto BiPAP unit on December 08, 2022.  She had called the office stating she was having some issues and I was able to work her into my office schedule by double booking today for evaluation.  A download was obtained from June 24 through March 02, 2023.  She is meeting compliance standards with 100% use.  There is essentially 0 leak.  However, AHI is significantly elevated at 31.9.  Her device 6 is set at a ramp time of 20 minutes, start EPAP and ramp at 5 and following ramp EPAP min of 10 with IPAP max of 25.  Apparently after much discussion it appears that oftentimes she is turning the machine off  and then the machine is restarting again with a ramp.  Her 95th percentile pressures are very elevated at 23.8/18.9 with maximal pressure and 24.6/19.6.  She continues to be on carvedilol 6.25 mg twice a day and lisinopril 20 mg daily for hypertension.  She is diabetic on metformin.  She is on rosuvastatin 5 mg for hyperlipidemia.  She also is on omeprazole for GI issues and takes methotrexate for psoriatic arthritis.  She presents for evaluation.   Past Medical History:  Diagnosis Date   Anemia    Anxiety    Arthritis    Closed fracture of fifth metatarsal bone 05/13/2018   Deep vein thrombosis (HCC)    right calf - 05/2012    Depression    Diabetes mellitus without complication (HCC)    diet controlled    GERD (gastroesophageal reflux disease)    Hyperlipidemia    Patient denies.   Hypertension    Ejection fraction =>55% Left ventricular systolic function is normal. Left ventricular wall motion is normal     Lymphocytic colitis    Neuropathy    diabetic - in bilateral feet    Pityriasis lichenoides chronica    Pneumonia    Sleep apnea    bipap    Past Surgical History:  Procedure Laterality Date   BILIARY STENT PLACEMENT  12/28/2022   Procedure: BILIARY STENT PLACEMENT;  Surgeon: Iva Boop, MD;  Location: [facility] ENDOSCOPY;  Service: Gastroenterology;;   BIOPSY  11/09/2022   Procedure: BIOPSY;  Surgeon: Lemar Lofty., MD;  Location: [facility] ENDOSCOPY;  Service: Gastroenterology;;   CHOLECYSTECTOMY N/A 12/23/2022   Procedure: LAPAROSCOPIC CHOLECYSTECTOMY WITH ICG DYE;  Surgeon: Gaynelle Adu, MD;  Location: [facility] ORS;  Service: General;  Laterality: N/A;   DILATION AND CURETTAGE OF UTERUS     ENDOSCOPIC RETROGRADE CHOLANGIOPANCREATOGRAPHY (ERCP) WITH PROPOFOL N/A 12/28/2022   Procedure: ENDOSCOPIC RETROGRADE CHOLANGIOPANCREATOGRAPHY (ERCP) WITH PROPOFOL;  Surgeon: Iva Boop, MD;  Location: [facility] ENDOSCOPY;  Service: Gastroenterology;  Laterality: N/A;   ENDOSCOPIC RETROGRADE  CHOLANGIOPANCREATOGRAPHY (ERCP) WITH PROPOFOL N/A 02/22/2023   Procedure: ENDOSCOPIC RETROGRADE CHOLANGIOPANCREATOGRAPHY (ERCP) WITH PROPOFOL;  Surgeon: Iva Boop, MD;  Location: [facility] ENDOSCOPY;  Service: Gastroenterology;  Laterality: N/A;   ESOPHAGOGASTRODUODENOSCOPY N/A 11/09/2022   Procedure: ESOPHAGOGASTRODUODENOSCOPY (EGD);  Surgeon: Lemar Lofty., MD;  Location: [facility] ENDOSCOPY;  Service: Gastroenterology;  Laterality: N/A;   EYE SURGERY  07/2020   GASTROINTESTINAL STENT REMOVAL N/A 02/22/2023   Procedure: GASTROINTESTINAL STENT REMOVAL;  Surgeon: Iva Boop, MD;  Location: [facility] ENDOSCOPY;  Service: Gastroenterology;  Laterality: N/A;   HAMMER TOE SURGERY     right hand surgery      due to blood infection    torn meniscus repair      right knee    TOTAL HIP ARTHROPLASTY  09/13/2012   Procedure: TOTAL HIP ARTHROPLASTY ANTERIOR APPROACH;  Surgeon: Shelda Pal, MD;  Location: [facility] ORS;  Service: Orthopedics;  Laterality: Right;   UPPER ESOPHAGEAL ENDOSCOPIC ULTRASOUND (EUS) N/A 11/09/2022   Procedure: UPPER ESOPHAGEAL ENDOSCOPIC ULTRASOUND (EUS);  Surgeon: Lemar Lofty., MD;  Location: [facility] ENDOSCOPY;  Service: Gastroenterology;  Laterality: N/A;    Allergies  Allergen Reactions   Other Shortness Of Breath    Blue fish: palms and feet turn red   Thorazine [Chlorpromazine] Other (See Comments)    Made her more more off balanced, vision   Bee Venom Swelling   Farxiga [Dapagliflozin] Swelling    Gi upset   Semaglutide     Rybelsus - lip swelling, GI upset   Sulfa Antibiotics Hives    Current Outpatient Medications  Medication Sig Dispense Refill   acetaminophen (TYLENOL) 500 MG tablet Take 1 tablet (500 mg total) by mouth every 6 (six) hours as needed for mild pain or fever.     allopurinol (ZYLOPRIM) 100 MG tablet TAKE 1 TABLET BY MOUTH TWICE A DAY 180 tablet 2   Biotin 5000 MCG CAPS Take 5,000 mcg by mouth daily.     carvedilol (COREG) 6.25 MG tablet Take 6.25  mg by mouth 2 (two) times daily.     cholecalciferol (VITAMIN D3) 25 MCG (1000 UNIT) tablet Take 1,000 Units by mouth daily.     cyanocobalamin (VITAMIN B12) 1000 MCG tablet Take 1,000 mcg by mouth daily.     cyclobenzaprine (FLEXERIL) 10 MG tablet TAKE 1 TABLET BY MOUTH AT BEDTIME 90 tablet 1   folic acid (FOLVITE) 1 MG tablet Take 1 mg by mouth daily.     insulin glargine (LANTUS SOLOSTAR) 100 UNIT/ML Solostar Pen Inject 20 Units into the skin 2 (two) times daily. 36 mL 2   lisinopril (ZESTRIL) 20 MG tablet Take 1 tablet (20 mg total) by mouth daily. 90 tablet 3   loperamide (IMODIUM) 2 MG capsule Take 2 mg by mouth daily.     melatonin 5 MG TABS Take 5 mg by mouth at bedtime.     metFORMIN (GLUCOPHAGE-XR) 500 MG 24 hr tablet TAKE 1 TABLET BY MOUTH TWICE A DAY 180 tablet 2   methotrexate (RHEUMATREX) 2.5 MG tablet Take 20 mg by mouth every Sunday at 6pm.     nitrofurantoin, macrocrystal-monohydrate, (MACROBID) 100 MG capsule Take 1 capsule (100 mg total) by mouth 2 (two) times daily. 10 capsule 0   omeprazole (PRILOSEC) 40 MG capsule Take 1 capsule (40 mg total) by mouth daily. 180 capsule 1   oxyCODONE-acetaminophen (PERCOCET) 10-325 MG tablet Take 1 tablet by mouth every 6 (six) hours as needed for pain. 10 tablet 0   polyethylene glycol (MIRALAX / GLYCOLAX) 17 g packet Take 17 g by mouth daily as needed for moderate constipation.     potassium chloride (KLOR-CON) 10 MEQ tablet TAKE 3 TABLETS BY MOUTH EVERY DAY 270 tablet 2   Probiotic Product (PROBIOTIC DAILY PO) Take 1 capsule by mouth daily.     rosuvastatin (CRESTOR) 5 MG tablet TAKE 1 TABLET BY MOUTH EVERY DAY 90 tablet 2   sertraline (ZOLOFT) 100 MG tablet Take  2 tablets (200 mg total) by mouth daily. 180 tablet 1   fluconazole (DIFLUCAN) 150 MG tablet 1 tab by mouth every 3 days as needed 2 tablet 1   sucralfate (CARAFATE) 1 GM/10ML suspension Take 10 mLs (1 g total) by mouth 4 (four) times daily -  with meals and at bedtime. (Patient  not taking: Reported on 03/03/2023) 414 mL 0   No current facility-administered medications for this visit.    Socially she is widowed. She has 2 children and 2 grandchildren. Her daughter is a GI physician. There is no tobacco use. She does drink occasional alcohol.  ROS General: Negative; No fevers, chills, or night sweats; 45 pound weight loss since October 2022 HEENT: Negative; No changes in vision or hearing, sinus congestion, difficulty swallowing Pulmonary: Negative; No cough, wheezing, shortness of breath, hemoptysis Cardiovascular: Negative; No chest pain, presyncope, syncope, palpitations; occasional feet swelling GI: History of lymphocytic colitis GU: Negative; No dysuria, hematuria, or difficulty voiding Musculoskeletal: Positive for R foot charcot joint; recent right ankle fracture Hematologic/Oncology: Negative; no easy bruising, bleeding Rheumatologic: Psoriatic arthritis;  History of gout; Charcot joint Endocrine: Negative; no heat/cold intolerance; no diabetes Neuro: Negative; no changes in balance, headaches Skin: History of psoriatic arthritis Psychiatric: Negative; No behavioral problems, depression Sleep: Positive for complex sleep apnea on BiPAP therapy;  No residual snoring, daytime sleepiness, hypersomnolence, bruxism, restless legs, hypnogognic hallucinations, no cataplexy Other comprehensive 14 point system review is negative.  PE BP 121/71 (BP Location: Left Arm, Patient Position: Sitting, Cuff Size: Normal)   Pulse 72   Ht 5\' 6"  (1.676 m)   Wt 201 lb (91.2 kg)   BMI 32.44 kg/m    Repeat blood pressure by me was 108/70  Wt Readings from Last 3 Encounters:  03/03/23 201 lb (91.2 kg)  02/22/23 193 lb (87.5 kg)  01/04/23 193 lb (87.5 kg)   General: Alert, oriented, no distress.  Skin: normal turgor, no rashes, warm and dry HEENT: Normocephalic, atraumatic. Pupils equal round and reactive to light; sclera anicteric; extraocular muscles intact;  Nose  without nasal septal hypertrophy Mouth/Parynx benign; Mallinpatti scale 3 Neck: No JVD, no carotid bruits; normal carotid upstroke Lungs: clear to ausculatation and percussion; no wheezing or rales Chest wall: without tenderness to palpitation Heart: PMI not displaced, RRR, s1 s2 normal, 1/6 systolic murmur, no diastolic murmur, no rubs, gallops, thrills, or heaves Abdomen: soft, nontender; no hepatosplenomehaly, BS+; abdominal aorta nontender and not dilated by palpation. Back: no CVA tenderness Pulses 2+ Musculoskeletal: full range of motion, normal strength, no joint deformities Extremities: no clubbing cyanosis or edema, Homan's sign negative  Neurologic: grossly nonfocal; Cranial nerves grossly wnl Psychologic: Normal mood and affect     EKG Interpretation Date/Time:  Wednesday March 03 2023 13:23:39 EDT Ventricular Rate:  72 PR Interval:  162 QRS Duration:  114 QT Interval:  386 QTC Calculation: 422 R Axis:   -7  Text Interpretation: Normal sinus rhythm Right bundle branch block When compared with ECG of 26-Dec-2022 09:33, No significant change since last tracing PREVIOUS ECG IS PRESENT Confirmed by Nicki Guadalajara ([id number]) on 03/07/2023 4:26:35 PM     October 20, 2021 ECG (independently read by me):  Sinus rhythm at 77, PAC, IRBBB,     April 07, 2021 ECG (independently read by me): NSR at 75, RBBB, no ectopy  December 2019 ECG (independently read by me): Sinus rhythm at 78 bpm.  Mild sinus arrhythmia.  Right bundle branch block with repolarization changes.  April 2019  ECG (independently read by me): Normal sinus rhythm at 83 bpm.  Right bundle branch block with repolarization changes.  QTc interval 4 6 0 ms.  September 2018 ECG (independently read by me): normal sinus rhythm at 79 bpm.  Right bundle-branch block.  QTc interval 481 ms.  June 2016 ECG (independently read by me):  Normal sinus rhythm with right bundle branch block.  October 2015 ECG (independently read by me):  Normal sinus rhythm at 71 beats per minute right bundle branch block with repolarization changes.  Normal intervals.  Prior July 2014 ECG sinus rhythm with incomplete right bundle branch block. QRS duration 118 ms. Nonspecific ST-T changes.  LABS:    Latest Ref Rng & Units 01/04/2023    8:39 PM 12/30/2022   12:59 PM 12/29/2022    3:41 AM  BMP  Glucose 70 - 99 mg/dL 469  629  528   BUN 8 - 23 mg/dL 10  14  16    Creatinine 0.44 - 1.00 mg/dL 4.13  2.44  0.10   Sodium 135 - 145 mmol/L 135  135  131   Potassium 3.5 - 5.1 mmol/L 4.7  3.5  3.8   Chloride 98 - 111 mmol/L 100  102  99   CO2 22 - 32 mmol/L 26  23  26    Calcium 8.9 - 10.3 mg/dL 8.9  8.7  8.5       Latest Ref Rng & Units 01/04/2023    8:39 PM 12/30/2022   12:59 PM 12/29/2022    3:41 AM  Hepatic Function  Total Protein 6.5 - 8.1 g/dL 6.0  5.4  4.6   Albumin 3.5 - 5.0 g/dL 2.7  2.2  2.0   AST 15 - 41 U/L 21  38  13   ALT 0 - 44 U/L 12  24  16    Alk Phosphatase 38 - 126 U/L 68  138  56   Total Bilirubin 0.3 - 1.2 mg/dL 0.3  0.5  0.6       Latest Ref Rng & Units 01/04/2023    8:39 PM 12/30/2022   12:59 PM 12/29/2022    3:41 AM  CBC  WBC 4.0 - 10.5 K/uL 11.5  12.0  15.7   Hemoglobin 12.0 - 15.0 g/dL 27.2  53.6  9.4   Hematocrit 36.0 - 46.0 % 31.9  31.8  28.4   Platelets 150 - 400 K/uL 261  227  190    Lab Results  Component Value Date   MCV 94.4 01/04/2023   MCV 93.0 12/30/2022   MCV 91.9 12/29/2022   Lab Results  Component Value Date   TSH 2.32 09/17/2022    Lab Results  Component Value Date   HGBA1C 5.8 (H) 12/22/2022    Lipid Panel     Component Value Date/Time   CHOL 124 09/17/2022 1049   TRIG 102.0 09/17/2022 1049   HDL 47.00 09/17/2022 1049   CHOLHDL 3 09/17/2022 1049   VLDL 20.4 09/17/2022 1049   LDLCALC 57 09/17/2022 1049   LDLDIRECT 139.0 08/15/2019 1114      RADIOLOGY: No results found.  IMPRESSION:  1. OSA (obstructive sleep apnea)   2. Essential hypertension   3. Hyperlipidemia with  target LDL less than 70   4. Right bundle branch block   5. Psoriatic arthritis (HCC) -Dr. Nickola Major   6. Class 1 obesity due to excess calories with serious comorbidity and body mass index (BMI) of 32.0 to 32.9 in adult  ASSESSMENT AND PLAN: Ms. 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   She sees Dr. Zenovia Jordan for rheumatology and had seen Dr. Matthias Hughs for her lymphocytic colitis.  She had broken her ankle remotely which had significantly limited her mobility and ability to walk and exercise.  She has had significant difficulty requiring a longstanding hospitalization with GI issues.  In addition she has been plagued by a long COVID syndrome and her initial protracted COVID infection from November 2020.  She had undergone wound care for an infection in her left foot which has been an issue since October 2021 and had been seeing Dr. Victorino Dike and Allen Derry, PA-C at wound clinic with resolution of her prior infection.  Most recently she is being cared in [place] at [facility].  Her son recently visited her and she tested positive for COVID last week and has been receiving molnupiravir for the past 4 days.  From a sleep perspective, she had been using a DreamStation auto BiPAP unit which was under recall.  Her old machine ultimately malfunctioned and she was given a loaner device which she had been using through her DME company.  After a very long delay, she finally received a new Respironics DreamStation auto BiPAP device.  [employer] is her DME company.  When I saw her in October 2023 it appears that she may not be having any pressure support since her device IPAP pressure greater than 90% of the time was 17.4 with device CPAP pressure at 17.0.  Her AHI is significantly increased at 32.5 present study and her maximum IPAP titration was 22 as well as maximum titrated EPAP  pressure.  At that visit I changed her settings to a maximum IPAP of 25 and EPAP minimum pressure of 10 with pressure support range of 5 to 8 cm.  Apparently her most recent download does not show these changes and she tells me that her recently received Respironics device has also been recalled.  When I last saw her in February 2024 with the recall of her prior machine, I recommended a new evaluation.  She underwent BiPAP titration and apparently was not transition to ASV.  I reviewed her sleep study with her in detail and she was titrated up to 22/18.  She received a new ResMed air curve 10 via auto BiPAP unit on December 08, 2022 and has been using her new machine for the last 2 months.  She is meeting compliance standards.  However AHI is significantly elevated.  Apparently, her ramp start EPAP was set at 5.  And following her ramp time, her minimum EPAP was 10 with maximum IPAP of 25.  Multiple times throughout the night she had turned her machine off to restart and as result she is now restarting at very low pressure which undoubtedly may be contributing to her significant AHI elevation.  She has no mask leak and her 95th percentile pressure is 23.8/18.9 with maximum pressure of 24.6/19.6.  Consequently, I am adjusting her settings.  I will decrease her ramp time from 20 down to 15 and will make her ramps start EPAP at 8.  She has a pressure support of 5.  I will also increase her minimum EPAP pressure to 12 following her ramp time.  New download will need to be obtained in a month to assess improvement.  Her blood pressure today is  stable on her current regimen.  Her ECG shows sinus rhythm at 72 with right bundle branch block.  She will continue her current regimen of carvedilol and lisinopril.  She continues to take rosuvastatin 5 mg for hyperlipidemia.  She is on metformin for diabetes mellitus.  With her GI history she is on omeprazole and previously was taking sucralfate.  She takes methotrexate for  psoriatic arthritis.  I will see her in 3 months for reevaluation or sooner as needed.    Lennette Bihari, MD, [facility], ABSM Diplomate, American Board of Sleep Medicine   03/07/2023 4:42 PM

## 2023-03-03 NOTE — Patient Instructions (Signed)
Medication Instructions:  NONE *If you need a refill on your cardiac medications before your next appointment, please call your pharmacy*   Lab Work: NONE If you have labs (blood work) drawn today and your tests are completely normal, you will receive your results only by: MyChart Message (if you have MyChart) OR A paper copy in the mail If you have any lab test that is abnormal or we need to change your treatment, we will call you to review the results.  Testing/Procedures: NONE  Follow-Up: At [facility], you and your health needs are our priority.  As part of our continuing mission to provide you with exceptional heart care, we have created designated Provider Care Teams.  These Care Teams include your primary Cardiologist (physician) and Advanced Practice Providers (APPs -  Physician Assistants and Nurse Practitioners) who all work together to provide you with the care you need, when you need it.  We recommend signing up for the patient portal called "MyChart".  Sign up information is provided on this After Visit Summary.  MyChart is used to connect with patients for Virtual Visits (Telemedicine).  Patients are able to view lab/test results, encounter notes, upcoming appointments, etc.  Non-urgent messages can be sent to your provider as well.   To learn more about what you can do with MyChart, go to [email or web address].    Your next appointment:   3 month(s)  Provider:   Nicki Guadalajara, MD  FOR SLEEP COMPLIANCE. OK TO DOUBLE BOOK  Please contact Brandie Rorie for all sleep/cpap needs at [phone]

## 2023-03-04 DIAGNOSIS — Z79899 Other long term (current) drug therapy: Secondary | ICD-10-CM | POA: Diagnosis not present

## 2023-03-04 DIAGNOSIS — L4059 Other psoriatic arthropathy: Secondary | ICD-10-CM | POA: Diagnosis not present

## 2023-03-04 DIAGNOSIS — R5383 Other fatigue: Secondary | ICD-10-CM | POA: Diagnosis not present

## 2023-03-06 ENCOUNTER — Encounter: Payer: Self-pay | Admitting: Internal Medicine

## 2023-03-07 ENCOUNTER — Other Ambulatory Visit: Payer: Self-pay | Admitting: Internal Medicine

## 2023-03-07 ENCOUNTER — Encounter: Payer: Self-pay | Admitting: Cardiovascular Disease

## 2023-03-07 DIAGNOSIS — N390 Urinary tract infection, site not specified: Secondary | ICD-10-CM

## 2023-03-09 ENCOUNTER — Other Ambulatory Visit: Payer: Self-pay

## 2023-03-09 DIAGNOSIS — N390 Urinary tract infection, site not specified: Secondary | ICD-10-CM

## 2023-03-09 MED ORDER — NITROFURANTOIN MONOHYD MACRO 100 MG PO CAPS
100.0000 mg | ORAL_CAPSULE | Freq: Two times a day (BID) | ORAL | 0 refills | Status: DC
Start: 2023-03-09 — End: 2023-03-18

## 2023-03-10 NOTE — Addendum Note (Signed)
Addended by: Brunetta Genera on: 03/10/2023 09:33 AM   Modules accepted: Orders

## 2023-03-11 ENCOUNTER — Encounter: Payer: Self-pay | Admitting: Internal Medicine

## 2023-03-12 ENCOUNTER — Encounter: Payer: Self-pay | Admitting: Internal Medicine

## 2023-03-12 ENCOUNTER — Other Ambulatory Visit (INDEPENDENT_AMBULATORY_CARE_PROVIDER_SITE_OTHER): Payer: Medicare Other

## 2023-03-12 ENCOUNTER — Other Ambulatory Visit: Payer: Self-pay

## 2023-03-12 DIAGNOSIS — R300 Dysuria: Secondary | ICD-10-CM

## 2023-03-12 LAB — URINALYSIS, ROUTINE W REFLEX MICROSCOPIC
Bilirubin Urine: NEGATIVE
Hgb urine dipstick: NEGATIVE
Ketones, ur: NEGATIVE
Leukocytes,Ua: NEGATIVE
Nitrite: NEGATIVE
RBC / HPF: NONE SEEN (ref 0–?)
Specific Gravity, Urine: 1.015 (ref 1.000–1.030)
Total Protein, Urine: NEGATIVE
Urine Glucose: NEGATIVE
Urobilinogen, UA: 0.2 (ref 0.0–1.0)
WBC, UA: NONE SEEN (ref 0–?)
pH: 6.5 (ref 5.0–8.0)

## 2023-03-15 ENCOUNTER — Encounter: Payer: Self-pay | Admitting: Internal Medicine

## 2023-03-17 ENCOUNTER — Other Ambulatory Visit: Payer: Self-pay

## 2023-03-17 ENCOUNTER — Other Ambulatory Visit: Payer: Medicare Other

## 2023-03-17 DIAGNOSIS — R300 Dysuria: Secondary | ICD-10-CM | POA: Diagnosis not present

## 2023-03-17 DIAGNOSIS — U099 Post covid-19 condition, unspecified: Secondary | ICD-10-CM | POA: Insufficient documentation

## 2023-03-17 NOTE — Progress Notes (Unsigned)
Subjective:    Patient ID: [name] [name], female    DOB: November 24, 1945, 77 y.o.   MRN: [id number]     HPI [name] [name] is here for follow up of her chronic medical problems.  Urine cx pending - treated by Gi for UTI with macrobid  ? Labs today  Medications and allergies reviewed with patient and updated if appropriate.  Current Outpatient Medications on File Prior to Visit  Medication Sig Dispense Refill   acetaminophen (TYLENOL) 500 MG tablet Take 1 tablet (500 mg total) by mouth every 6 (six) hours as needed for mild pain or fever.     allopurinol (ZYLOPRIM) 100 MG tablet TAKE 1 TABLET BY MOUTH TWICE A DAY 180 tablet 2   Biotin 5000 MCG CAPS Take 5,000 mcg by mouth daily.     carvedilol (COREG) 6.25 MG tablet Take 6.25 mg by mouth 2 (two) times daily.     cholecalciferol (VITAMIN D3) 25 MCG (1000 UNIT) tablet Take 1,000 Units by mouth daily.     cyanocobalamin (VITAMIN B12) 1000 MCG tablet Take 1,000 mcg by mouth daily.     cyclobenzaprine (FLEXERIL) 10 MG tablet TAKE 1 TABLET BY MOUTH AT BEDTIME 90 tablet 1   fluconazole (DIFLUCAN) 150 MG tablet 1 tab by mouth every 3 days as needed 2 tablet 1   folic acid (FOLVITE) 1 MG tablet Take 1 mg by mouth daily.     insulin glargine (LANTUS SOLOSTAR) 100 UNIT/ML Solostar Pen Inject 20 Units into the skin 2 (two) times daily. 36 mL 2   lisinopril (ZESTRIL) 20 MG tablet Take 1 tablet (20 mg total) by mouth daily. 90 tablet 3   loperamide (IMODIUM) 2 MG capsule Take 2 mg by mouth daily.     melatonin 5 MG TABS Take 5 mg by mouth at bedtime.     metFORMIN (GLUCOPHAGE-XR) 500 MG 24 hr tablet TAKE 1 TABLET BY MOUTH TWICE A DAY 180 tablet 2   methotrexate (RHEUMATREX) 2.5 MG tablet Take 20 mg by mouth every Sunday at 6pm.     nitrofurantoin, macrocrystal-monohydrate, (MACROBID) 100 MG capsule Take 1 capsule (100 mg total) by mouth 2 (two) times daily. 10 capsule 0   omeprazole (PRILOSEC) 40 MG capsule Take 1 capsule (40 mg total) by mouth  daily. 180 capsule 1   oxyCODONE-acetaminophen (PERCOCET) 10-325 MG tablet Take 1 tablet by mouth every 6 (six) hours as needed for pain. 10 tablet 0   polyethylene glycol (MIRALAX / GLYCOLAX) 17 g packet Take 17 g by mouth daily as needed for moderate constipation.     potassium chloride (KLOR-CON) 10 MEQ tablet TAKE 3 TABLETS BY MOUTH EVERY DAY 270 tablet 2   Probiotic Product (PROBIOTIC DAILY PO) Take 1 capsule by mouth daily.     rosuvastatin (CRESTOR) 5 MG tablet TAKE 1 TABLET BY MOUTH EVERY DAY 90 tablet 2   sertraline (ZOLOFT) 100 MG tablet Take 2 tablets (200 mg total) by mouth daily. 180 tablet 1   No current facility-administered medications on file prior to visit.     Review of Systems     Objective:  There were no vitals filed for this visit. BP Readings from Last 3 Encounters:  03/03/23 121/71  02/22/23 (!) 170/63  01/14/23 130/76   Wt Readings from Last 3 Encounters:  03/03/23 201 lb (91.2 kg)  02/22/23 193 lb (87.5 kg)  01/04/23 193 lb (87.5 kg)   There is no height or weight on file to calculate  BMI.    Physical Exam     Lab Results  Component Value Date   WBC 11.5 (H) 01/04/2023   HGB 10.1 (L) 01/04/2023   HCT 31.9 (L) 01/04/2023   PLT 261 01/04/2023   GLUCOSE 114 (H) 01/04/2023   CHOL 124 09/17/2022   TRIG 102.0 09/17/2022   HDL 47.00 09/17/2022   LDLDIRECT 139.0 08/15/2019   LDLCALC 57 09/17/2022   ALT 12 01/04/2023   AST 21 01/04/2023   NA 135 01/04/2023   K 4.7 01/04/2023   CL 100 01/04/2023   CREATININE 0.85 01/04/2023   BUN 10 01/04/2023   CO2 26 01/04/2023   TSH 2.32 09/17/2022   INR 1.4 (H) 12/27/2022   HGBA1C 5.8 (H) 12/22/2022   MICROALBUR <0.7 09/17/2022     Assessment & Plan:    See Problem List for Assessment and Plan of chronic medical problems.

## 2023-03-17 NOTE — Assessment & Plan Note (Addendum)
Chronic Has persistent decreased appetite, decreased taste, hiccups, fatigue and memory issues since having covid

## 2023-03-17 NOTE — Patient Instructions (Addendum)
      Blood work was ordered.   The lab is on the first floor.    Medications changes include :       A referral was ordered and someone will call you to schedule an appointment.     Return in about 6 months (around 09/18/2023) for follow up.

## 2023-03-18 ENCOUNTER — Ambulatory Visit (INDEPENDENT_AMBULATORY_CARE_PROVIDER_SITE_OTHER): Payer: Medicare Other | Admitting: Internal Medicine

## 2023-03-18 ENCOUNTER — Encounter: Payer: Self-pay | Admitting: Internal Medicine

## 2023-03-18 VITALS — BP 130/68 | HR 96 | Temp 98.3°F

## 2023-03-18 DIAGNOSIS — F3289 Other specified depressive episodes: Secondary | ICD-10-CM

## 2023-03-18 DIAGNOSIS — F419 Anxiety disorder, unspecified: Secondary | ICD-10-CM

## 2023-03-18 DIAGNOSIS — I10 Essential (primary) hypertension: Secondary | ICD-10-CM | POA: Diagnosis not present

## 2023-03-18 DIAGNOSIS — Z7984 Long term (current) use of oral hypoglycemic drugs: Secondary | ICD-10-CM | POA: Diagnosis not present

## 2023-03-18 DIAGNOSIS — E1142 Type 2 diabetes mellitus with diabetic polyneuropathy: Secondary | ICD-10-CM

## 2023-03-18 DIAGNOSIS — U099 Post covid-19 condition, unspecified: Secondary | ICD-10-CM

## 2023-03-18 DIAGNOSIS — E785 Hyperlipidemia, unspecified: Secondary | ICD-10-CM | POA: Diagnosis not present

## 2023-03-18 DIAGNOSIS — L4050 Arthropathic psoriasis, unspecified: Secondary | ICD-10-CM | POA: Diagnosis not present

## 2023-03-18 MED ORDER — COLESTIPOL HCL 1 G PO TABS: 2.0000 g | ORAL_TABLET | Freq: Two times a day (BID) | ORAL | Status: DC

## 2023-03-18 NOTE — Assessment & Plan Note (Addendum)
Chronic Blood pressure well controlled CMP, CBC Continue carvedilol 6.25 mg twice daily, lisinopril 20 mg daily

## 2023-03-18 NOTE — Assessment & Plan Note (Addendum)
Chronic Not ideally controlled Discussed that we could consider changing her medication to something else to see if that helped more-she will think about this and let me know Continue sertraline to 200 mg daily

## 2023-03-18 NOTE — Assessment & Plan Note (Signed)
Chronic Following with podiatry regularly

## 2023-03-18 NOTE — Assessment & Plan Note (Addendum)
Chronic Somewhat controlled Discussed changing to a different medication to see if that helps more Continue sertraline to 200 mg daily

## 2023-03-18 NOTE — Assessment & Plan Note (Signed)
Chronic Regular exercise and healthy diet encouraged Check lipid panel  Continue Crestor 5 mg daily 

## 2023-03-18 NOTE — Assessment & Plan Note (Addendum)
Chronic   Lab Results  Component Value Date   HGBA1C 5.8 (H) 12/22/2022   Sugars controlled Testing sugars 1 times a day-well-controlled Check A1c Continue metformin XR 500 mg twice daily, Lantus 20 units twice daily Stressed regular exercise, diabetic diet

## 2023-03-18 NOTE — Assessment & Plan Note (Signed)
Chronic Management per rheumatology On methotrexate

## 2023-03-20 ENCOUNTER — Encounter: Payer: Self-pay | Admitting: Internal Medicine

## 2023-03-22 ENCOUNTER — Ambulatory Visit: Payer: Medicare Other | Admitting: Podiatry

## 2023-03-31 ENCOUNTER — Ambulatory Visit: Payer: Medicare Other | Admitting: Podiatry

## 2023-04-01 DIAGNOSIS — D649 Anemia, unspecified: Secondary | ICD-10-CM | POA: Diagnosis not present

## 2023-04-01 DIAGNOSIS — L4059 Other psoriatic arthropathy: Secondary | ICD-10-CM | POA: Diagnosis not present

## 2023-04-06 ENCOUNTER — Ambulatory Visit: Payer: Medicare Other | Admitting: Podiatry

## 2023-04-09 DIAGNOSIS — M47896 Other spondylosis, lumbar region: Secondary | ICD-10-CM | POA: Diagnosis not present

## 2023-04-09 DIAGNOSIS — M47816 Spondylosis without myelopathy or radiculopathy, lumbar region: Secondary | ICD-10-CM | POA: Diagnosis not present

## 2023-04-10 ENCOUNTER — Other Ambulatory Visit: Payer: Self-pay | Admitting: Internal Medicine

## 2023-04-16 ENCOUNTER — Ambulatory Visit (INDEPENDENT_AMBULATORY_CARE_PROVIDER_SITE_OTHER): Payer: Medicare Other | Admitting: Podiatry

## 2023-04-16 ENCOUNTER — Encounter: Payer: Self-pay | Admitting: Podiatry

## 2023-04-16 DIAGNOSIS — B351 Tinea unguium: Secondary | ICD-10-CM | POA: Diagnosis not present

## 2023-04-16 DIAGNOSIS — M79674 Pain in right toe(s): Secondary | ICD-10-CM

## 2023-04-16 DIAGNOSIS — M79675 Pain in left toe(s): Secondary | ICD-10-CM

## 2023-04-16 DIAGNOSIS — E1142 Type 2 diabetes mellitus with diabetic polyneuropathy: Secondary | ICD-10-CM

## 2023-04-16 NOTE — Progress Notes (Signed)
This patient returns to my office for at risk foot care.  This patient requires this care by a professional since this patient will be at risk due to having peripheral neuropathy. This patient is unable to cut nails herself since the patient cannot reach her nails.These nails are painful walking and wearing shoes.  This patient presents for at risk foot care today. Previous foot surgery for Charcot foot by Liz Malady. ? ?General Appearance  Alert, conversant and in no acute stress. ? ?Vascular  Dorsalis pedis and posterior tibial  pulses are weakly  palpable  bilaterally.  Capillary return is within normal limits  bilaterally. Cold feet.  bilaterally. ? ?Neurologic  Senn-Weinstein monofilament wire test diminished  bilaterally. Muscle power within normal limits bilaterally. ? ?Nails Thick disfigured discolored nails with subungual debris  from hallux to fifth toes bilaterally. No evidence of bacterial infection or drainage bilaterally. ? ?Orthopedic  No limitations of motion  feet .  No crepitus or effusions noted.  No bony pathology or digital deformities noted. Charcot foot left foot. ? ?Skin  normotropic skin with no porokeratosis noted bilaterally.  No signs of infections or ulcers noted.    ? ?Onychomycosis  Pain in right toes  Pain in left toes ? ?Consent was obtained for treatment procedures.   Mechanical debridement of nails 1-5  bilaterally performed with a nail nipper.  Filed with dremel without incident.  ? ? ?Return office visit    3 months                  Told patient to return for periodic foot care and evaluation due to potential at risk complications. ? ? ?Gardiner Barefoot DPM   ?

## 2023-04-19 ENCOUNTER — Telehealth: Payer: Self-pay

## 2023-04-19 ENCOUNTER — Other Ambulatory Visit: Payer: Self-pay

## 2023-04-19 MED ORDER — BUDESONIDE 3 MG PO CPEP: 9.0000 mg | ORAL_CAPSULE | Freq: Every day | ORAL | 2 refills | Status: AC

## 2023-04-19 NOTE — Telephone Encounter (Signed)
Patient says the budesonide needs a PA.

## 2023-04-23 ENCOUNTER — Other Ambulatory Visit: Payer: Self-pay

## 2023-04-23 ENCOUNTER — Other Ambulatory Visit: Payer: Self-pay | Admitting: Internal Medicine

## 2023-04-23 MED ORDER — ALPRAZOLAM 0.25 MG PO TABS
0.2500 mg | ORAL_TABLET | Freq: Two times a day (BID) | ORAL | 2 refills | Status: DC | PRN
  Filled 2023-04-23: qty 30, 15d supply, fill #0

## 2023-04-26 ENCOUNTER — Other Ambulatory Visit (HOSPITAL_COMMUNITY): Payer: Self-pay

## 2023-04-26 NOTE — Telephone Encounter (Signed)
Test claims show that Budesonide is too early to refill for the patient due to a 3 month supply being filled 07/30 via Mail order. Insurance will cover next fill on 10/05

## 2023-04-27 DIAGNOSIS — M19011 Primary osteoarthritis, right shoulder: Secondary | ICD-10-CM | POA: Diagnosis not present

## 2023-04-27 DIAGNOSIS — M25511 Pain in right shoulder: Secondary | ICD-10-CM | POA: Diagnosis not present

## 2023-04-30 ENCOUNTER — Ambulatory Visit (INDEPENDENT_AMBULATORY_CARE_PROVIDER_SITE_OTHER): Payer: Medicare Other | Admitting: Internal Medicine

## 2023-04-30 ENCOUNTER — Encounter: Payer: Self-pay | Admitting: Internal Medicine

## 2023-04-30 VITALS — BP 132/62 | HR 68 | Ht 64.0 in | Wt 197.5 lb

## 2023-04-30 DIAGNOSIS — R110 Nausea: Secondary | ICD-10-CM | POA: Diagnosis not present

## 2023-04-30 DIAGNOSIS — K219 Gastro-esophageal reflux disease without esophagitis: Secondary | ICD-10-CM | POA: Diagnosis not present

## 2023-04-30 DIAGNOSIS — R159 Full incontinence of feces: Secondary | ICD-10-CM

## 2023-04-30 DIAGNOSIS — L4059 Other psoriatic arthropathy: Secondary | ICD-10-CM | POA: Diagnosis not present

## 2023-04-30 MED ORDER — COLESTIPOL HCL 1 G PO TABS: 2.0000 g | ORAL_TABLET | Freq: Two times a day (BID) | ORAL | 2 refills | Status: DC

## 2023-04-30 MED ORDER — OMEPRAZOLE 40 MG PO CPDR: 40.0000 mg | DELAYED_RELEASE_CAPSULE | Freq: Every day | ORAL | 1 refills | Status: DC

## 2023-04-30 NOTE — Patient Instructions (Addendum)
Your provider has requested that you have an abdominal x ray before leaving today. Please go to the basement floor to our Radiology department for the test.  We have sent the following medications to your pharmacy for you to pick up at your convenience: Omeprazole, Colestipol  Start taking a daily fiber supplement Benefiber or Metamucil   You are schedule for a follow up visit on 08/12/22 at 9:10 am _______________________________________________________  If your blood pressure at your visit was 140/90 or greater, please contact your primary care physician to follow up on this.  _______________________________________________________  If you are age 48 or older, your body mass index should be between 23-30. Your Body mass index is 33.9 kg/m. If this is out of the aforementioned range listed, please consider follow up with your Primary Care Provider.  If you are age 50 or younger, your body mass index should be between 19-25. Your Body mass index is 33.9 kg/m. If this is out of the aformentioned range listed, please consider follow up with your Primary Care Provider.   ________________________________________________________  The [facility] providers would like to encourage you to use [facility] to communicate with providers for non-urgent requests or questions.  Due to long hold times on the telephone, sending your provider a message by [facility] may be a faster and more efficient way to get a response.  Please allow 48 business hours for a response.  Please remember that this is for non-urgent requests.  _______________________________________________________ Due to recent changes in healthcare laws, you may see the results of your imaging and laboratory studies on MyChart before your provider has had a chance to review them.  We understand that in some cases there may be results that are confusing or concerning to you. Not all laboratory results come back in the same time frame and the provider may  be waiting for multiple results in order to interpret others.  Please give [place] 48 hours in order for your provider to thoroughly review all the results before contacting the office for clarification of your results.   Thank you for entrusting me with your care and for choosing [facility], Dr. Eulah Pont

## 2023-04-30 NOTE — Progress Notes (Signed)
Chief Complaint: Bile leak s/p cholecystectomy, microscopic colitis  HPI : 77 year old female with history of microscopic colitis, gallstones s/p cholecystectomy c/b bile leak, IBS, DVT, DM, hiatal hernia, GERD, HTN, HFpEF, OSA, and psoriatic arthritis on Cimzia presents for follow up for bile leak s/p cholecystectomy and microscopic colitis  Interval History: Her ERCP went well with Dr. Leone Payor, and she was able to get her biliary stent removed with no residual signs of any bile leak. She is having some intermittent nausea.  At times she is not able to eat well due to the nausea.  Colestipol has helped but she is only taking it once a day because she does not want the colestipol to interfere with her other medications. She is still on 4 tablets of Imodium per day. She has leakage of stool about 1-2 times per day. Endorses intake of high fiber. She has developed weakness in one of her muscles in her back and has been doing aqua therapy.   Wt Readings from Last 3 Encounters:  04/30/23 197 lb 8 oz (89.6 kg)  03/03/23 201 lb (91.2 kg)  02/22/23 193 lb (87.5 kg)    Current Outpatient Medications  Medication Sig Dispense Refill   acetaminophen (TYLENOL) 500 MG tablet Take 1 tablet (500 mg total) by mouth every 6 (six) hours as needed for mild pain or fever.     allopurinol (ZYLOPRIM) 100 MG tablet TAKE 1 TABLET BY MOUTH TWICE A DAY 180 tablet 2   ALPRAZolam (XANAX) 0.25 MG tablet Take 1 tablet (0.25 mg total) by mouth 2 (two) times daily as needed. 30 tablet 2   Biotin 5000 MCG CAPS Take 5,000 mcg by mouth daily.     budesonide (ENTOCORT EC) 3 MG 24 hr capsule Take 3 capsules (9 mg total) by mouth daily. 180 capsule 2   carvedilol (COREG) 6.25 MG tablet Take 6.25 mg by mouth 2 (two) times daily.     cholecalciferol (VITAMIN D3) 25 MCG (1000 UNIT) tablet Take 1,000 Units by mouth daily.     clobetasol cream (TEMOVATE) 0.05 % Apply 1 Application topically as needed.     colestipol (COLESTID) 1 g  tablet TAKE 2 TABLETS BY MOUTH 2 TIMES DAILY. 360 tablet 2   cyanocobalamin (VITAMIN B12) 1000 MCG tablet Take 1,000 mcg by mouth daily.     cyclobenzaprine (FLEXERIL) 10 MG tablet TAKE 1 TABLET BY MOUTH AT BEDTIME 90 tablet 1   folic acid (FOLVITE) 1 MG tablet Take 1 mg by mouth daily.     insulin glargine (LANTUS SOLOSTAR) 100 UNIT/ML Solostar Pen Inject 20 Units into the skin 2 (two) times daily. 36 mL 2   lisinopril (ZESTRIL) 20 MG tablet Take 1 tablet (20 mg total) by mouth daily. 90 tablet 3   loperamide (IMODIUM) 2 MG capsule Take 2 mg by mouth daily.     melatonin 5 MG TABS Take 5 mg by mouth at bedtime.     metFORMIN (GLUCOPHAGE-XR) 500 MG 24 hr tablet TAKE 1 TABLET BY MOUTH TWICE A DAY 180 tablet 2   methotrexate (RHEUMATREX) 2.5 MG tablet Take 17.5 mg by mouth every Sunday at 6pm.     naloxone [facility]) nasal spray 4 mg/0.1 mL Place 1 spray into the nose as needed.     omeprazole (PRILOSEC) 40 MG capsule Take 1 capsule (40 mg total) by mouth daily. 180 capsule 1   ondansetron (ZOFRAN-ODT) 4 MG disintegrating tablet Take by mouth.     oxyCODONE-acetaminophen (PERCOCET) 10-325  MG tablet Take 1 tablet by mouth 3 (three) times daily as needed.     polyethylene glycol (MIRALAX / GLYCOLAX) 17 g packet Take 17 g by mouth daily as needed for moderate constipation.     potassium chloride (KLOR-CON) 10 MEQ tablet TAKE 3 TABLETS BY MOUTH EVERY DAY 270 tablet 2   Probiotic Product (PROBIOTIC DAILY PO) Take 1 capsule by mouth daily.     rosuvastatin (CRESTOR) 5 MG tablet TAKE 1 TABLET BY MOUTH EVERY DAY 90 tablet 2   sertraline (ZOLOFT) 100 MG tablet Take 2 tablets (200 mg total) by mouth daily. 180 tablet 1   No current facility-administered medications for this visit.   Physical Exam: BP 132/62 (BP Location: Left Arm, Patient Position: Sitting, Cuff Size: Normal)   Pulse 68   Ht 5\' 4"  (1.626 m)   Wt 197 lb 8 oz (89.6 kg)   BMI 33.90 kg/m  Constitutional: Pleasant,well-developed, female in  no acute distress. HEENT: Normocephalic and atraumatic. Conjunctivae are normal. No scleral icterus. Cardiovascular: Normal rate, regular rhythm.  Pulmonary/chest: Effort normal and breath sounds normal. No wheezing, rales or rhonchi. Abdominal: Soft, non-distended, nontender Extremities: Trace BLE edema Neurological: Alert and oriented to person place and time. Skin: Skin is warm and dry. No rashes noted. Psychiatric: Normal mood and affect. Behavior is normal.  Labs 11/2020: CMP with low K of 3.3, elevated glucose of 220, ALT mildly elevated at 45, albumin low at 3.2. HbA1C of 8.5%  Labs 05/2021: CBC with elevated WBC of 11.9. CRP mildly elevated at 1.2 (decreased from 1.6). ESR mildly elevated at 35.  Labs 10/2022: CBC showed a mildly elevated WBC of 12.2.  Lipase and LFTs were normal.   Labs 12/2022: CMP with low albumin of 2.7. Lipase nml. CBC with elevated WBC of 11.5 and low Hb of 10.1. Lipase nml.   CT A/P w/contrast 10/02/18: 1.  Descending and sigmoid colon segmental mural thickening and  pericolonic stranding indicating colitis.  Distribution of thickening  can suggest ischemic colitis although inflammatory colitis is not  excluded.  There are mild associated diverticula but no focal inflamed  diverticulum.  2.  Pancreas atrophy.  Multifocal hyperenhancing nodular density at  the tail of pancreas, 12 mm.  Small developing mass cannot be  excluded.  Recommend follow-up MRI pancreas in 6 months.  This finding  was not described in the initial [facility] preliminary report.  Ab U/S 10/03/18: Mild fatty liver. Cholelithiasis without findings of acute  cholecystitis. Extra hepatic bile ducts and pancreatic tail are  obscured.  CT A/P w/contrast 11/02/18: IMPRESSION: 1. Heterogeneous fatty replacement throughout the pancreas, without pancreatic mass or acute process. 2.  Possible constipation. 3. Hepatic steatosis and hepatomegaly. 4.  Tiny hiatal hernia. 5.  Aortic Atherosclerosis  (ICD10-I70.0). 6. Degraded evaluation of the pelvis, secondary to beam hardening artifact from right hip arthroplasty. 7. Uterine fibroid.  CT A/P w/o contrast 07/12/20: IMPRESSION: 1. Diverticulosis without diverticulitis. 2. No acute intra-abdominal or intrapelvic process. 3.  Aortic Atherosclerosis (ICD10-I70.0).  Barium swallow 09/27/20: IMPRESSION: 1. Proximal anterior esophageal web, nonobstructive to passage of a 13 mm barium tablet. 2. Otherwise unremarkable double contrast barium esophagram.  CT A/P w/contrast 10/21/22: IMPRESSION: 1. No definite acute CT findings of the abdomen or pelvis to explain abdominal discomfort or vomiting. No evidence of bowel obstruction. 2. 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 3. Diffusely atrophic appearance of the pancreas.  No pancreatic ductal dilatation or surrounding inflammatory changes. Aortic Atherosclerosis (ICD10-I70.0).  Gastric emptying study 12/01/22: IMPRESSION: No scintigraphic evidence of delayed gastric emptying.  MR Liver w/contrast 12/26/22: IMPRESSION: 1. Study is positive for postoperative bile leak with bilomas noted in the gallbladder fossa and overlying the central aspect of the liver, as detailed above. 2. Trace volume of perihepatic ascites. 3. Morphologic changes in the liver suggestive of cirrhosis. 4. Small right pleural effusion lying dependently. Passive areas of subsegmental atelectasis in the lower lobes of the lungs bilaterally.  CT drain placement 12/27/22: IMPRESSION: 1. Successful CT guided placement of a 8 [place] all purpose drain catheter into the gallbladder fossa with aspiration of 30 mL of bilious fluid. Samples were sent to the laboratory as requested by the ordering clinical team. 2. Successful CT-guided placement of a 10 French all-purpose drain catheter into the  perihepatic fluid collection with aspiration of approximately 1 L of bilious fluid.  CT A/P w/contrast 01/04/23: IMPRESSION: 1. Post cholecystectomy. Biliary drain has been placed. Percutaneous draining catheters are seen in the gallbladder fossa and along the liver capsule. Diminished perihepatic ascites. 2. Small right pleural effusion. 3. Ground-glass opacities within the lingula and left lower lobe may represent atelectasis or pneumonia. 4. Aortic atherosclerosis.  Fistulogram 01/06/23: IMPRESSION: No definite fistula to the biliary tree. Contrast accumulates predominately in the perihepatic space and in the gallbladder fossa at both drain sites. Gallbladder fossa drain will be removed today. Right perihepatic drain were remain to gravity drainage for 2 more weeks with no flushes.  EGD 09/17/21: Findings: - Multiple mild (non-circumferential scarring) stenoses were found in the entire esophagus. The stenoses were traversed. A guidewire was placed and the scope was withdrawn. Dilation was performed with a Savary dilator with no resistance at 15 mm and mild resistance at 16 mm. The dilation site was examined and showed mild mucosal disruption in the upper esophagus. - A small hiatal hernia was present. - Localized mild inflammation characterized by congestion (edema), erythema and granularity was found in the gastric body and in the gastric antrum. Biopsies were taken with a cold forceps for histology. - Multiple sessile polyps with no bleeding and no stigmata of recent bleeding were found in the gastric body. Biopsies were taken with a cold forceps for histology. - The examined duodenum was normal. Impression: - Mild esophageal stenoses. Dilated. - Small hiatal hernia. - Gastritis. Biopsied. - Multiple gastric polyps. Biopsied. - Normal examined duodenum. Path: 1. Surgical [P], gastric - ANTRAL AND OXYNTIC MUCOSA WITH SLIGHT CHRONIC INFLAMMATION. - NO HELICOBACTER PYLORI  IDENTIFIED. 2. Surgical [P], gastric polyps - FUNDIC GLAND POLYPS.  Colonoscopy 09/17/21: - One 12 mm polyp in the cecum, removed with a hot snare. Resected and retrieved. - Seven 3 to 8 mm polyps in the transverse colon and in the ascending colon, removed with a cold snare. Resected and retrieved. - Diverticulosis in the sigmoid colon and in the descending colon. - One 7 mm polyp in the rectum, removed with a cold snare. Resected and retrieved. - Non-bleeding internal hemorrhoids. Path: 3. Surgical [P], colon, cecum, polyp (1) - TUBULAR ADENOMA WITHOUT HIGH GRADE DYSPLASIA. 4. Surgical [P], colon, ascending, transverse, polyp (7) - TUBULAR ADENOMA (7) WITHOUT HIGH GRADE DYSPLASIA. 5. Surgical [P], colon, random sites - UNREMARKABLE COLONIC MUCOSA. - NO MICROSCOPIC COLITIS, ACTIVE INFLAMMATION OR CHRONIC CHANGES. 6. Surgical [P], colon, rectum, polyp (1) - TUBULOVILLOUS ADENOMA WITHOUT HIGH GRADE DYPLASIA.  EUS 11/09/22:  Path: A. DUODENUM, BIOPSY:  - Duodenal mucosa without  diagnostic abnormality  - Negative for celiac change  B. STOMACH, ANTRUM, BIOPSY:  - Antral mucosa with chronic, focally active gastritis  - Immunohistochemical stain is negative for Helicobacter organisms  - Negative for malignancy  - See comment  C. STOMACH, BODY, BIOPSY:  - Oxyntic mucosa with mild chronic gastritis and proton pump inhibitor  type effect/early fundic gland polyp like changes  - Negative for Helicobacter organisms on HE stain  - Negative for malignancy  - See comment  COMMENT:  Immunohistochemical staining for CK AE1/AE3 is performed on both blocks  B and C.  The stains are negative which is consistent with the above  diagnosis.   ERCP 12/28/22:   ERCP 02/22/23: - One stent from the biliary tree was seen in the major papilla. Normal post- cholecystectomy biliary tree - no leak or stones. Stent was removed.   ASSESSMENT AND PLAN: Nausea Fecal incontinence Diarrhea S/p  cholecystectomy Microscopic colitis IBS GERD Hiatal hernia History of esophageal stenosis Atrophic appearance of pancreas on CT in 10/2022 Patient had resolution of her bile leak status postcholecystectomy.  She had a ERCP with Dr. Leone Payor in 02/2023 with removal of her biliary stent and subsequent contrast imaging that did not suggest any residual leak.  This is good news.  Patient does continue to have some nausea as well as fecal incontinence.  She does feel that the colestipol medication appears to be helping with her diarrhea.  At this time it is not entirely clear to me whether or not the patient is actually having true diarrhea versus overflow diarrhea/incontinence.  Thus I will plan to perform a KUB to assess for underlying stool burden.  If she does have significant underlying stool burden, constipation may actually be contributing to her nausea issues.  In the meantime  will refer her to pelvic floor physical therapy to try to help with her fecal incontinence.  I also asked her to try a daily fiber supplement such as Benefiber or Metamucil. - Previously encouraged high protein, high fiber, and low fat diet - Continue budesonide 9 mg QD - Continue Zofran PRN - Cont omeprazole 40 mg QD - Cont colestipol 2 g every day. Refill - Start daily fiber supplement - Referral pelvic floor PT with Eulis Foster - Check KUB to assess underlying stool burden - RTC in 3 months  Eulah Pont, MD  I spent 30  minutes of time, including in depth chart review, independent review of results as outlined above, communicating results with the patient directly, face-to-face time with the patient, coordinating care, and ordering studies and medications as appropriate, and documentation.

## 2023-05-05 DIAGNOSIS — Z23 Encounter for immunization: Secondary | ICD-10-CM | POA: Diagnosis not present

## 2023-05-06 ENCOUNTER — Ambulatory Visit
Admission: RE | Admit: 2023-05-06 | Discharge: 2023-05-06 | Disposition: A | Discharge status: Home / Self Care | Payer: Medicare Other | Source: Ambulatory Visit | Attending: Internal Medicine | Admitting: Internal Medicine

## 2023-05-06 ENCOUNTER — Other Ambulatory Visit: Payer: Self-pay

## 2023-05-06 DIAGNOSIS — R110 Nausea: Secondary | ICD-10-CM | POA: Diagnosis not present

## 2023-05-06 DIAGNOSIS — K219 Gastro-esophageal reflux disease without esophagitis: Secondary | ICD-10-CM

## 2023-05-06 DIAGNOSIS — K5900 Constipation, unspecified: Secondary | ICD-10-CM | POA: Diagnosis not present

## 2023-05-06 DIAGNOSIS — R159 Full incontinence of feces: Secondary | ICD-10-CM

## 2023-05-06 DIAGNOSIS — R109 Unspecified abdominal pain: Secondary | ICD-10-CM | POA: Diagnosis not present

## 2023-05-07 ENCOUNTER — Other Ambulatory Visit: Payer: Self-pay

## 2023-05-07 MED ORDER — ONDANSETRON 4 MG PO TBDP: 4.0000 mg | ORAL_TABLET | Freq: Three times a day (TID) | ORAL | 0 refills | Status: DC | PRN

## 2023-05-10 ENCOUNTER — Encounter: Payer: Self-pay | Admitting: Internal Medicine

## 2023-05-12 ENCOUNTER — Other Ambulatory Visit: Payer: Self-pay

## 2023-05-12 MED ORDER — AZELASTINE HCL 0.1 % NA SOLN: NASAL | 12 refills | Status: DC

## 2023-05-17 ENCOUNTER — Other Ambulatory Visit: Payer: Self-pay | Admitting: Internal Medicine

## 2023-05-17 ENCOUNTER — Encounter: Payer: Self-pay | Admitting: Internal Medicine

## 2023-05-18 ENCOUNTER — Ambulatory Visit: Payer: Medicare Other | Attending: Cardiovascular Disease | Admitting: Cardiovascular Disease

## 2023-05-18 ENCOUNTER — Other Ambulatory Visit: Payer: Self-pay

## 2023-05-18 MED ORDER — SERTRALINE HCL 100 MG PO TABS: 200.0000 mg | ORAL_TABLET | Freq: Every day | ORAL | 1 refills | Status: DC

## 2023-05-18 MED ORDER — ONDANSETRON 4 MG PO TBDP: 4.0000 mg | ORAL_TABLET | Freq: Three times a day (TID) | ORAL | 0 refills | Status: DC | PRN

## 2023-05-18 MED ORDER — LOPERAMIDE HCL 2 MG PO CAPS: 2.0000 mg | ORAL_CAPSULE | Freq: Every day | ORAL | 5 refills | Status: DC

## 2023-05-19 MED ORDER — CYCLOBENZAPRINE HCL 10 MG PO TABS: 10.0000 mg | ORAL_TABLET | Freq: Every day | ORAL | 1 refills | Status: DC

## 2023-05-19 MED ORDER — ALPRAZOLAM 0.25 MG PO TABS: 0.2500 mg | ORAL_TABLET | Freq: Two times a day (BID) | ORAL | 5 refills | Status: DC | PRN

## 2023-05-25 DIAGNOSIS — M51369 Other intervertebral disc degeneration, lumbar region without mention of lumbar back pain or lower extremity pain: Secondary | ICD-10-CM | POA: Diagnosis not present

## 2023-05-25 DIAGNOSIS — M47896 Other spondylosis, lumbar region: Secondary | ICD-10-CM | POA: Diagnosis not present

## 2023-05-25 DIAGNOSIS — M5451 Vertebrogenic low back pain: Secondary | ICD-10-CM | POA: Diagnosis not present

## 2023-05-28 DIAGNOSIS — L4059 Other psoriatic arthropathy: Secondary | ICD-10-CM | POA: Diagnosis not present

## 2023-05-28 DIAGNOSIS — Z79899 Other long term (current) drug therapy: Secondary | ICD-10-CM | POA: Diagnosis not present

## 2023-06-01 ENCOUNTER — Other Ambulatory Visit: Payer: Self-pay

## 2023-06-01 ENCOUNTER — Encounter: Payer: Self-pay | Admitting: Cardiovascular Disease

## 2023-06-01 ENCOUNTER — Encounter: Payer: Self-pay | Admitting: Internal Medicine

## 2023-06-01 MED ORDER — LISINOPRIL 20 MG PO TABS: 20.0000 mg | ORAL_TABLET | Freq: Every day | ORAL | 0 refills | Status: DC

## 2023-06-01 MED ORDER — CARVEDILOL 6.25 MG PO TABS: 6.2500 mg | ORAL_TABLET | Freq: Two times a day (BID) | ORAL | 2 refills | Status: DC

## 2023-06-03 DIAGNOSIS — D3131 Benign neoplasm of right choroid: Secondary | ICD-10-CM | POA: Diagnosis not present

## 2023-06-03 DIAGNOSIS — E119 Type 2 diabetes mellitus without complications: Secondary | ICD-10-CM | POA: Diagnosis not present

## 2023-06-03 DIAGNOSIS — H524 Presbyopia: Secondary | ICD-10-CM | POA: Diagnosis not present

## 2023-06-03 LAB — HM DIABETES EYE EXAM

## 2023-06-05 DIAGNOSIS — Z23 Encounter for immunization: Secondary | ICD-10-CM | POA: Diagnosis not present

## 2023-06-14 ENCOUNTER — Telehealth: Payer: Self-pay | Admitting: Internal Medicine

## 2023-06-14 ENCOUNTER — Other Ambulatory Visit: Payer: Self-pay

## 2023-06-14 MED ORDER — LANTUS SOLOSTAR 100 UNIT/ML ~~LOC~~ SOPN: 20.0000 [IU] | PEN_INJECTOR | Freq: Two times a day (BID) | SUBCUTANEOUS | 0 refills | Status: DC

## 2023-06-14 NOTE — Telephone Encounter (Signed)
15 day supply sent in today.

## 2023-06-14 NOTE — Telephone Encounter (Signed)
Pt is waiting for her insulin glargine (LANTUS SOLOSTAR) 100 UNIT/ML Solostar Pen RX to come in the mail through [facility], but there was a delay in shipping and the medication will not arrive until 11.11.24.   Pt will run out of current RX today, and is requesting we send a short supply of insulin glargine (LANTUS SOLOSTAR) 100 UNIT/ML Solostar Pen to [facility]. Please send RX to: [id number]   Phone: [phone]  Fax: [phone]

## 2023-06-16 DIAGNOSIS — L700 Acne vulgaris: Secondary | ICD-10-CM | POA: Diagnosis not present

## 2023-06-16 DIAGNOSIS — L720 Epidermal cyst: Secondary | ICD-10-CM | POA: Diagnosis not present

## 2023-06-17 ENCOUNTER — Other Ambulatory Visit: Payer: Self-pay

## 2023-06-17 ENCOUNTER — Telehealth: Payer: Self-pay

## 2023-06-17 MED ORDER — LANTUS SOLOSTAR 100 UNIT/ML ~~LOC~~ SOPN: 20.0000 [IU] | PEN_INJECTOR | Freq: Two times a day (BID) | SUBCUTANEOUS | 0 refills | Status: DC

## 2023-06-17 NOTE — Telephone Encounter (Signed)
Updated script sent for patient for 4 lm to last her for 10 days until mail order arrives.

## 2023-06-19 ENCOUNTER — Encounter: Payer: Self-pay | Admitting: Internal Medicine

## 2023-06-20 ENCOUNTER — Encounter: Payer: Self-pay | Admitting: Internal Medicine

## 2023-06-21 ENCOUNTER — Other Ambulatory Visit: Payer: Self-pay

## 2023-06-21 DIAGNOSIS — R300 Dysuria: Secondary | ICD-10-CM

## 2023-06-21 NOTE — Telephone Encounter (Signed)
Called twice again without response. Did not leave a voicemail this time. Please let her know that I do worry that her taking loperamide may worsen her underlying constipation since her last X-ray already shows a significant amount of stool burden. Please let her know that I have tried to call several times to discuss this with her.

## 2023-06-21 NOTE — Telephone Encounter (Signed)
Attempted to call the patient twice without response. Left a voicemail letting her know that I called.

## 2023-06-22 ENCOUNTER — Other Ambulatory Visit: Payer: Self-pay

## 2023-06-22 DIAGNOSIS — E1142 Type 2 diabetes mellitus with diabetic polyneuropathy: Secondary | ICD-10-CM

## 2023-06-22 DIAGNOSIS — E66811 Obesity, class 1: Secondary | ICD-10-CM

## 2023-06-22 MED ORDER — LANTUS SOLOSTAR 100 UNIT/ML ~~LOC~~ SOPN: 20.0000 [IU] | PEN_INJECTOR | Freq: Two times a day (BID) | SUBCUTANEOUS | 2 refills | Status: DC

## 2023-06-23 ENCOUNTER — Encounter: Payer: Self-pay | Admitting: Cardiovascular Disease

## 2023-06-23 ENCOUNTER — Ambulatory Visit: Payer: Medicare Other | Attending: Cardiovascular Disease | Admitting: Cardiovascular Disease

## 2023-06-23 ENCOUNTER — Other Ambulatory Visit: Payer: Self-pay | Admitting: Internal Medicine

## 2023-06-23 VITALS — BP 100/56 | HR 62 | Ht 66.0 in | Wt 201.0 lb

## 2023-06-23 DIAGNOSIS — E6609 Other obesity due to excess calories: Secondary | ICD-10-CM | POA: Insufficient documentation

## 2023-06-23 DIAGNOSIS — E66811 Obesity, class 1: Secondary | ICD-10-CM | POA: Diagnosis not present

## 2023-06-23 DIAGNOSIS — E785 Hyperlipidemia, unspecified: Secondary | ICD-10-CM | POA: Diagnosis not present

## 2023-06-23 DIAGNOSIS — G4733 Obstructive sleep apnea (adult) (pediatric): Secondary | ICD-10-CM | POA: Insufficient documentation

## 2023-06-23 DIAGNOSIS — Z6832 Body mass index (BMI) 32.0-32.9, adult: Secondary | ICD-10-CM | POA: Diagnosis not present

## 2023-06-23 DIAGNOSIS — L4050 Arthropathic psoriasis, unspecified: Secondary | ICD-10-CM | POA: Insufficient documentation

## 2023-06-23 DIAGNOSIS — I10 Essential (primary) hypertension: Secondary | ICD-10-CM | POA: Diagnosis not present

## 2023-06-23 DIAGNOSIS — I4510 Unspecified right bundle-branch block: Secondary | ICD-10-CM | POA: Diagnosis not present

## 2023-06-23 NOTE — Patient Instructions (Addendum)
Medication Instructions:  No medication changes *If you need a refill on your cardiac medications before your next appointment, please call your pharmacy*  [facility] AT [phone] FOR ALL YOUR SLEEP CARE CONCERNS.   Lab Work: No labs ordered If you have labs (blood work) drawn today and your tests are completely normal, you will receive your results only by: MyChart Message (if you have MyChart) OR A paper copy in the mail If you have any lab test that is abnormal or we need to change your treatment, we will call you to review the results.   Testing/Procedures: No testing   Follow-Up: At [facility], you and your health needs are our priority.  As part of our continuing mission to provide you with exceptional heart care, we have created designated Provider Care Teams.  These Care Teams include your primary Cardiologist (physician) and Advanced Practice Providers (APPs -  Physician Assistants and Nurse Practitioners) who all work together to provide you with the care you need, when you need it.  We recommend signing up for the patient portal called "MyChart".  Sign up information is provided on this After Visit Summary.  MyChart is used to connect with patients for Virtual Visits (Telemedicine).  Patients are able to view lab/test results, encounter notes, upcoming appointments, etc.  Non-urgent messages can be sent to your provider as well.   To learn more about what you can do with MyChart, go to [email or web address].    Your next appointment:   6 month(s)  Provider:   Nicki Guadalajara, MD

## 2023-06-24 ENCOUNTER — Other Ambulatory Visit (INDEPENDENT_AMBULATORY_CARE_PROVIDER_SITE_OTHER): Payer: Medicare Other

## 2023-06-24 ENCOUNTER — Other Ambulatory Visit: Payer: Self-pay | Admitting: Internal Medicine

## 2023-06-24 DIAGNOSIS — R300 Dysuria: Secondary | ICD-10-CM | POA: Diagnosis not present

## 2023-06-24 LAB — URINALYSIS, ROUTINE W REFLEX MICROSCOPIC
Bilirubin Urine: NEGATIVE
Ketones, ur: NEGATIVE
Nitrite: POSITIVE — AB
Specific Gravity, Urine: 1.015 (ref 1.000–1.030)
Total Protein, Urine: NEGATIVE
Urine Glucose: NEGATIVE
Urobilinogen, UA: 0.2 (ref 0.0–1.0)
pH: 6.5 (ref 5.0–8.0)

## 2023-06-24 MED ORDER — NITROFURANTOIN MONOHYD MACRO 100 MG PO CAPS: 100.0000 mg | ORAL_CAPSULE | Freq: Two times a day (BID) | ORAL | 0 refills | Status: AC

## 2023-06-26 LAB — CULTURE, URINE COMPREHENSIVE

## 2023-06-30 ENCOUNTER — Encounter: Payer: Self-pay | Admitting: Cardiovascular Disease

## 2023-06-30 ENCOUNTER — Encounter: Payer: Self-pay | Admitting: Internal Medicine

## 2023-06-30 NOTE — Progress Notes (Signed)
Patient ID: [name] [name], female   DOB: Dec 19, 1945, 77 y.o.   MRN: [id number]        HPI: [name] [name] is a 77 y.o. female percents in the office today for a 5 month followup sleep/cardiology evaluation.  [name] [name] is a retired [employer] who has a history of hypertension, obesity, psoriatic arthritis, DVT, which occurred while traveling to [place], as well as complex obstructive sleep apnea.  She has had difficulty in the past with CPAP therapy. She has been able to tolerate BiPAP and has had a Respironics BiPAP Auto unit well but also has had difficulty with some of her high pressure requirements. In April 2014 I changed her maximum BiPAP pressure to 19 and her maximum EPAP pressure to 15. I also reduced her minimum EPAP pressure to 6 and her minimal IPAP pressure to 10 with pressure support of 4 and changed her Bi_Flex to 3. She has been followed by [facility].  Clinically, she had felt markedly improved with BiPAP.  Compared to prior CPAP therapy.  She has psoriatic arthritis and is on Humira as well as methotrexate followed by rheumatology.  She also has a Charcot joint in her right foot.   She is on Cymbalta for pain relief relative to this. She does not routinely exercise.  She keeps busy moving around, but typically does not do aerobic activity.  She has not been successful with significant weight loss.   She recently, she has been taking lisinopril HCTZ 20/12.5 mg for hypertension.  She had experienced an episode of chest discomfort which she felt was like a cramp and she noticed this most when standing up, but was associated with diaphoresis and shortness of breath.  She does have family history for coronary artery disease both in her mother as well as in her maternal grandmother. She has not been able to be very active due to her Charcot foot.  She also had noticed some mild shortness of breath with activity.  She eventually was referred for a nuclear perfusion study on 01/31/2015  which revealed normal perfusion and function; ejection fraction 64%.  When I saw her in February 2017, her BiPAP machine had begun to fail.  She continues to use CPAP with 100% compliance and cannot sleep without it.  She typically sleeps for at least 8 hours per night.  She had taken her BiPAP machine to [facility] who stated that her machine essentially had stopped working and is in need for new machine.   She received a new Respironics Dream station auto BiPAP unit in April 2017.  She uses a Field seismologist FX full face mask, medium size.  She feels that the machine has been working well but she has had difficulty with her sleep.  At times she feels that she is not getting enough pressure.  Previously she had been set on a BiPAP auto unit with an EPAP minimum of 8 and IPAP max of 19 , which had worked well.  I obtained a new download today from 02/16/2016 through 03/16/2016.  She is 100% compliant and is averaging 8 hours and 25 minutes of sleep.  Apparently, she is not been set on auto and has been on and IPAP of 14 and an EPAP of 10.  Her AHI is increased at 25.1. I changed her BiPAP mode from a fixed pressure to auto BiPAP initiating atenolol over 6 with potential titration up to 20/16.  I also allowed for pressure support to change from 4-6 as  needed.  In 2018, she felt well from a cardiac perspective, but has had significant infections including MRSA, strep throat, UTI, and she had fractured her right ankle.  As result, she has noticed some ankle swelling right greater than left.  She continues to use her BiPAP and believes she is feeling much better than she had in the past.  [name] is her DME company.  A download in the office from 01/26/2017 through 04/25/2017.  She is 100% compliance.  She is averaging 8 hours and 36 minutes of sleep per night.  Her average device IPAP pressure was 18.2 and average device CPAP pressure greater than 90% of the time was 14.1.  AHI continued to be elevated at 21.2. An Epworth  scale score was calculated in the office today and this endorsed at 4, arguing against daytime sleepiness.  I last saw her, she was sleeping adequate duration.  Her AHI remains elevated I suggested that she reduce ramp time from 30 minutes down to 20 minutes and increased her starting pressure from 4 up to 6 cm.  I saw her in September 2019 at which time she had again started to notice  symptoms where she was waking up at night.  She noted leaks from her mask at higher pressures.  She feels that she is more  tired.  She had some swelling right lower extremity greater than left and she has a Charcot right foot.  She had broken her right ankle in August 2018.  She denied any chest pain.  She is unaware of any cardiac arrhythmias.  Her daughter, Dr. Tressia Danas is a GI physician who had been practicing in [facility] and will be moving up to work with [employer].  She is grateful that she will now have her twin granddaughters close by.  She was recently placed on Cimzia for her psoriatic arthritis to take in addition to her methotrexate.  She is followed by Dr. Zenovia Jordan for her rheumatologic issues.   When I evaluated her in December 2019 she wassleeping 8 hours 29 minutes and her average IPAP pressure greater than 90% 16.7 with an average EPAP pressure of 12.4.  I reduced her ramp time down to 20 minutes.  Having issues with her mask and I recommended a trial of the new DreamWear full facemask by either a Respironics or a ResMed.  Over the past several months, she has not been able to be active due to her fracture of her fifth metatarsal.  She was in a hard cast for 2 months with no weightbearing.  As result she has not been able to exercise.  She has gained weight.  Was just removed several weeks ago.  She admits to ankle swelling.  She continues to use her BiPAP.  A new download was obtained and she again is compliant.  Her average device IPAP pressure greater than 90% of the time was  18.1 with a maximum titrated IPAP pressure of 20.  AHI remained elevated at 17.9.    She went to [place] in January 2020 and unfortunately became ill and spent the entire trip in the hospital due to ischemic colitis.  She was subsequently evaluated by Micah Flesher in a telemedicine visit in June 2020.  When I last evaluated her in a telemedicine visit in June 2021 she had not been able to do much walking because of tenderness from her bone on the plantar aspect of her foot.  She has fallen arches.  She  is being evaluated by Dr. Victorino Dike.  Around Thanksgiving 2020 both she and her husband both became ill with COVID-19 pneumonia and both were hospitalized for approximately a week.  She had temperatures of 103, she was treated with remdesivir, convalescent plasma in addition to steroids.  She has subsequently not regained her sense of taste or smell and has continued to experience fatigability.  During that time she was not able to use BiPAP.  Apparently she was having issues stating that Medicare is going to deny her BiPAP.  She has continued to use BiPAP therapy.  I attempted to try to get a download from her Resp[employer] website today but we were unable to do this in the office.  I have reached out to Claiborne Rigg who will contact [employer] to see if we can obtain her most recent download.  [name] [name] continues to be on lisinopril/HCTZ for hypertension, rosuvastatin 5 mg for hyperlipidemia.  She was on methotrexate for rheumatoid arthritis but this was stopped approximately 6 weeks ago.  She is diabetic on metformin.     I scheduled her for a 2D echo Doppler study to reassess LV function following her Covid infection which was done on February 06, 2020.  LV function was excellent with EF of 65 to 70%.  There was grade 1 diastolic dysfunction.  There was mild mitral regurgitation.   I evaluated her in a telemedicine visit on May 24, 2020.  Since her last evaluation, her Respironics dream station has been on  recall.  She has registered with [employer] and attempt to get a new machine.  She had called the office shortly thereafter wondering if she should still use therapy.  At that time with the severity of her sleep apnea and the incidence of only 0.3% of devices with problems I had recommended she continue therapy.  She uses [facility] as her DME company.  I asked her today if she had been contacted regarding insertion of a bacteria inlet filter device which she could place into her tubing but apparently this had not been done by [facility].  She feels well.  She again has been immobile and that her left leg is in a hard cast making it difficult to walk.  She does note some occasional ankle swelling.  She was wondering if she should continue with aspirin which she has been taking 81 mg 3 times a week.  She admits to being under increased stress since her husband unfortunately had a brain bleed but is starting to improve.  She has been on lisinopril HCT 20/25 mg.  She has continued to be on lipid-lowering therapy with rosuvastatin 5 mg and LDL cholesterol was 40.   I last saw her in August 2022.  She was ill in November 2021 with an intestinal virus, she required ICU she tells me she was cared for by Dr. Matthias Hughs.  He apparently had an extensive hospital stay.  She has had difficulty with long COVID resulting from her COVID infection in November 2020.  She recently has been under months of extensive care for her left foot ulcers requiring wound care visits for her previous infections which has resolved.  From a sleep perspective, she has had difficulty and recently adjustments were made to her dream station auto BiPAP therapy.  Her device is on recall and she is not been aware of any timeframe when potentially she can receive a new device.  Due to continued difficulty with her unit, approximately 1 month ago her pressures  her pressures were adjusted such that now her ramp time is set at 20 minutes, ramp start pressure 6,  minimum EPAP 14, maximum IPAP 25, with pressure support range of 3 to 8 cm of water.  Her most recent download shows 100% usage with average use at 9 hours and 49 minutes per night.  Her 90% pressures are 22.3/19.0 with maximum titrated pressures at 25/23.  Despite this average AHI continues to be elevated at 16.3.    I saw her on October 20, 2021.  3 days previously she had just  received the new Dream Station BiPAP replacement device.  Her old device had died and she had received a "loaner "unit from her DME company.  Had lost 45 pounds since October 2022.  At times she  experiences some mild lightheadedness.  She was unaware of palpitations.    I  saw her in May 18, 2022.  Last week she was diagnosed with COVID infection after her son visited her and has been receiving molnuparivir therapy.  She has been using her new DreamWear Respironics auto bilevel device.  However, the device had not been linked to our office.  A download was able to be obtained through the card.  It appears that her minimum EPAP is set at 9 with maximum IPAP 22 with a pressure support range of 0 to 5 cm.  Her humidification is at 3 and she does admit to dry mouth.  A download demonstrates increased AHI at 32.5 with device IPAP EPAP pressure greater than 90% of the time at 17.  As result it does not appear that there has been a pressure support and essentially for pressure support has been 0.  She does admit to some feet swelling.  She denied chest pain.  During that evaluation, I suggested changes be made to her device and recommended she be switched to a maximum IPAP setting of 25 and set her minimum EPAP pressure at 10 cm of water.  I also recommended she change pressure support to a range of 5 to 8 cm of water.  I last saw her on September 22, 2022 at which time she told me that her new Respironics machine had been recalled.   A new download was obtained today but unfortunately this did not provide her most recent data.  AHI was  32.5 and apparently her 90% BiPAP and 90% EPAP were the same at 17.4.  During her evaluation today I suggested that she bring her machine to her office tomorrow so that we can take her card out to obtain new data since apparently we could not obtain that today.  This again showed an significant AHI elevation at 33.6 and it appears that her IPAP and EPAP are similar in it is not utilizing the BiPAP pressures that were recommended.  Her maximum titrated IPAP and EPAP pressures are 22 and 90th percentile pressures were 17.9 and 17.8, respectively.  She continues to use therapy for the nights duration averaging 9 hours and 26 minutes.  She continues to be on carvedilol 6.25 mg twice a day, HCTZ 12.5 mg as needed, lisinopril 20 mg daily for hypertension.  She is diabetic on metformin 500 mg twice a day.  She is on rosuvastatin 5 mg daily for hyperlipidemia.  LDL cholesterol on September 17, 2022 was 57.  During that evaluation I recommended that she undergo a BiPAP/possible ASV since the titration prior to receiving a new device.  [name] [name] underwent a BiPAP titration on  October 14, 2022.  BiPAP was initiated 8/4 and titrated to maximum optimal pressure 22/18.  AHI was 0, RDI 6.6 and O2 nadir at 93%.  She did not achieve any REM sleep being on the 16/12 titration.  She was not transition to ASV.  She received a new ResMed air curve 10 via auto BiPAP unit on December 08, 2022.  She had called the office stating she was having some issues and I was able to work her into my office schedule by double booking today for evaluation.  A download was obtained from June 24 through March 02, 2023.  She is meeting compliance standards with 100% use.  There is essentially 0 leak.  However, AHI is significantly elevated at 31.9.  Her device 6 is set at a ramp time of 20 minutes, start EPAP and ramp at 5 and following ramp EPAP min of 10 with IPAP max of 25.  Apparently after much discussion it appears that oftentimes she is turning the  machine off and then the machine is restarting again with a ramp.  Her 95th percentile pressures are very elevated at 23.8/18.9 with maximal pressure and 24.6/19.6.  She continues to be on carvedilol 6.25 mg twice a day and lisinopril 20 mg daily for hypertension.  She is diabetic on metformin.  She is on rosuvastatin 5 mg for hyperlipidemia.  She also is on omeprazole for GI issues and takes methotrexate for psoriatic arthritis.   Since I last saw her, she has continued to use a ResMed air curve 10 via auto unit.  I obtained a download from October 13 through June 21, 2023.  Usage is 100% which average use at 7 hours and 30 minutes.  Her pressure is set at a minimum EPAP of 10, maximum IPAP of 25 with pressure support of 5.  She has issues with her mask however there is no leak.  AHI is significantly elevated at 35.1 with an AI index of 33.2 and central index of 0.3.  Upon further questioning, at least 3 times during the night she is turning her machine off to restart.  Apparently her ramp time is set at 20 cm and her start EPAP is only 5 undoubtedly contributing to significant undertreatment for at least 1 hour.  She presents for evaluation.   Past Medical History:  Diagnosis Date   Anemia    Anxiety    Arthritis    Closed fracture of fifth metatarsal bone 05/13/2018   Deep vein thrombosis (HCC)    right calf - 05/2012    Depression    Diabetes mellitus without complication (HCC)    diet controlled    GERD (gastroesophageal reflux disease)    Hyperlipidemia    Patient denies.   Hypertension    Ejection fraction =>55% Left ventricular systolic function is normal. Left ventricular wall motion is normal     Lymphocytic colitis    Neuropathy    diabetic - in bilateral feet    Pityriasis lichenoides chronica    Pneumonia    Sleep apnea    bipap    Past Surgical History:  Procedure Laterality Date   BILIARY STENT PLACEMENT  12/28/2022   Procedure: BILIARY STENT PLACEMENT;  Surgeon:  Iva Boop, MD;  Location: [facility] ENDOSCOPY;  Service: Gastroenterology;;   BIOPSY  11/09/2022   Procedure: BIOPSY;  Surgeon: Lemar Lofty., MD;  Location: [facility] ENDOSCOPY;  Service: Gastroenterology;;   CHOLECYSTECTOMY N/A 12/23/2022   Procedure: LAPAROSCOPIC CHOLECYSTECTOMY WITH ICG DYE;  Surgeon: Gaynelle Adu, MD;  Location: [facility] ORS;  Service: General;  Laterality: N/A;   DILATION AND CURETTAGE OF UTERUS     ENDOSCOPIC RETROGRADE CHOLANGIOPANCREATOGRAPHY (ERCP) WITH PROPOFOL N/A 12/28/2022   Procedure: ENDOSCOPIC RETROGRADE CHOLANGIOPANCREATOGRAPHY (ERCP) WITH PROPOFOL;  Surgeon: Iva Boop, MD;  Location: [facility] ENDOSCOPY;  Service: Gastroenterology;  Laterality: N/A;   ENDOSCOPIC RETROGRADE CHOLANGIOPANCREATOGRAPHY (ERCP) WITH PROPOFOL N/A 02/22/2023   Procedure: ENDOSCOPIC RETROGRADE CHOLANGIOPANCREATOGRAPHY (ERCP) WITH PROPOFOL;  Surgeon: Iva Boop, MD;  Location: [facility] ENDOSCOPY;  Service: Gastroenterology;  Laterality: N/A;   ESOPHAGOGASTRODUODENOSCOPY N/A 11/09/2022   Procedure: ESOPHAGOGASTRODUODENOSCOPY (EGD);  Surgeon: Lemar Lofty., MD;  Location: [facility] ENDOSCOPY;  Service: Gastroenterology;  Laterality: N/A;   EYE SURGERY  07/2020   GASTROINTESTINAL STENT REMOVAL N/A 02/22/2023   Procedure: GASTROINTESTINAL STENT REMOVAL;  Surgeon: Iva Boop, MD;  Location: [facility] ENDOSCOPY;  Service: Gastroenterology;  Laterality: N/A;   HAMMER TOE SURGERY     right hand surgery      due to blood infection    torn meniscus repair      right knee    TOTAL HIP ARTHROPLASTY  09/13/2012   Procedure: TOTAL HIP ARTHROPLASTY ANTERIOR APPROACH;  Surgeon: Shelda Pal, MD;  Location: [facility] ORS;  Service: Orthopedics;  Laterality: Right;   UPPER ESOPHAGEAL ENDOSCOPIC ULTRASOUND (EUS) N/A 11/09/2022   Procedure: UPPER ESOPHAGEAL ENDOSCOPIC ULTRASOUND (EUS);  Surgeon: Lemar Lofty., MD;  Location: [facility] ENDOSCOPY;  Service: Gastroenterology;  Laterality: N/A;    Allergies  Allergen  Reactions   Other Shortness Of Breath    Blue fish: palms and feet turn red   Thorazine [Chlorpromazine] Other (See Comments)    Made her more more off balanced, vision   Bee Venom Swelling   Farxiga [Dapagliflozin] Swelling    Gi upset   Semaglutide     Rybelsus - lip swelling, GI upset   Sulfa Antibiotics Hives    Current Outpatient Medications  Medication Sig Dispense Refill   acetaminophen (TYLENOL) 500 MG tablet Take 1 tablet (500 mg total) by mouth every 6 (six) hours as needed for mild pain or fever.     allopurinol (ZYLOPRIM) 100 MG tablet TAKE 1 TABLET BY MOUTH TWICE A DAY 180 tablet 2   ALPRAZolam (XANAX) 0.25 MG tablet Take 1 tablet (0.25 mg total) by mouth 2 (two) times daily as needed. 60 tablet 5   azelastine (ASTELIN) 0.1 % nasal spray INSTILL 2 SPRAYS IN EACH NOSTRIL TWICE DAILY 30 mL 12   Biotin 5000 MCG CAPS Take 5,000 mcg by mouth daily.     budesonide (ENTOCORT EC) 3 MG 24 hr capsule Take 3 capsules (9 mg total) by mouth daily. 180 capsule 2   carvedilol (COREG) 6.25 MG tablet Take 1 tablet (6.25 mg total) by mouth 2 (two) times daily. Take 6.25 mg by mouth 2 (two) times daily. 180 tablet 2   cholecalciferol (VITAMIN D3) 25 MCG (1000 UNIT) tablet Take 1,000 Units by mouth daily.     clobetasol cream (TEMOVATE) 0.05 % Apply 1 Application topically as needed.     colestipol (COLESTID) 1 g tablet Take 2 tablets (2 g total) by mouth 2 (two) times daily. 360 tablet 2   cyanocobalamin (VITAMIN B12) 1000 MCG tablet Take 1,000 mcg by mouth daily.     cyclobenzaprine (FLEXERIL) 10 MG tablet Take 1 tablet (10 mg total) by mouth at bedtime. 90 tablet 1   folic acid (FOLVITE) 1 MG tablet Take 1 mg by mouth daily.  insulin glargine (LANTUS SOLOSTAR) 100 UNIT/ML Solostar Pen Inject 20 Units into the skin 2 (two) times daily. 36 mL 2   lisinopril (ZESTRIL) 20 MG tablet Take 1 tablet (20 mg total) by mouth daily. 90 tablet 0   loperamide (IMODIUM) 2 MG capsule Take 1 capsule (2  mg total) by mouth daily. 30 capsule 5   melatonin 5 MG TABS Take 5 mg by mouth at bedtime.     metFORMIN (GLUCOPHAGE-XR) 500 MG 24 hr tablet TAKE 1 TABLET BY MOUTH TWICE A DAY 180 tablet 2   methotrexate (RHEUMATREX) 2.5 MG tablet Take 17.5 mg by mouth every Sunday at 6pm.     omeprazole (PRILOSEC) 40 MG capsule Take 1 capsule (40 mg total) by mouth daily. 180 capsule 1   ondansetron (ZOFRAN-ODT) 4 MG disintegrating tablet Take 1 tablet (4 mg total) by mouth every 8 (eight) hours as needed for nausea or vomiting. 20 tablet 0   oxyCODONE-acetaminophen (PERCOCET) 10-325 MG tablet Take 1 tablet by mouth 3 (three) times daily as needed.     polyethylene glycol (MIRALAX / GLYCOLAX) 17 g packet Take 17 g by mouth daily as needed for moderate constipation.     potassium chloride (KLOR-CON) 10 MEQ tablet TAKE 3 TABLETS BY MOUTH EVERY DAY 270 tablet 2   Probiotic Product (PROBIOTIC DAILY PO) Take 1 capsule by mouth daily.     rosuvastatin (CRESTOR) 5 MG tablet TAKE 1 TABLET BY MOUTH EVERY DAY 90 tablet 2   sertraline (ZOLOFT) 100 MG tablet Take 2 tablets (200 mg total) by mouth daily. 180 tablet 1   naloxone (NARCAN) nasal spray 4 mg/0.1 mL Place 1 spray into the nose as needed. (Patient not taking: Reported on 06/23/2023)     nitrofurantoin, macrocrystal-monohydrate, (MACROBID) 100 MG capsule Take 1 capsule (100 mg total) by mouth 2 (two) times daily for 7 days. 14 capsule 0   No current facility-administered medications for this visit.    Socially she is widowed. She has 2 children and 2 grandchildren. Her daughter is a GI physician. There is no tobacco use. She does drink occasional alcohol.  ROS General: Negative; No fevers, chills, or night sweats; 45 pound weight loss since October 2022 HEENT: Negative; No changes in vision or hearing, sinus congestion, difficulty swallowing Pulmonary: Negative; No cough, wheezing, shortness of breath, hemoptysis Cardiovascular: Negative; No chest pain,  presyncope, syncope, palpitations; occasional feet swelling GI: History of lymphocytic colitis GU: Negative; No dysuria, hematuria, or difficulty voiding Musculoskeletal: Positive for R foot charcot joint; recent right ankle fracture Hematologic/Oncology: Negative; no easy bruising, bleeding Rheumatologic: Psoriatic arthritis;  History of gout; Charcot joint Endocrine: Negative; no heat/cold intolerance; no diabetes Neuro: Negative; no changes in balance, headaches Skin: History of psoriatic arthritis Psychiatric: Negative; No behavioral problems, depression Sleep: Positive for complex sleep apnea on BiPAP therapy;  No residual snoring, daytime sleepiness, hypersomnolence, bruxism, restless legs, hypnogognic hallucinations, no cataplexy  An Epworth Sleepiness Scale was calculated in the office today, June 23, 2023 which endorsed at 5 argue against residual daytime sleepiness.  Other comprehensive 14 point system review is negative.  PE BP (!) 100/56 (BP Location: Left Arm, Patient Position: Sitting, Cuff Size: Normal)   Pulse 62   Ht 5\' 6"  (1.676 m)   Wt 201 lb (91.2 kg)   SpO2 99%   BMI 32.44 kg/m    Repeat blood pressure by me was 108/64.  Wt Readings from Last 3 Encounters:  06/23/23 201 lb (91.2 kg)  04/30/23 197  lb 8 oz (89.6 kg)  03/03/23 201 lb (91.2 kg)   General: Alert, oriented, no distress.  Skin: normal turgor, no rashes, warm and dry HEENT: Normocephalic, atraumatic. Pupils equal round and reactive to light; sclera anicteric; extraocular muscles intact;  Nose without nasal septal hypertrophy Mouth/Parynx benign; Mallinpatti scale 3 Neck: No JVD, no carotid bruits; normal carotid upstroke Lungs: clear to ausculatation and percussion; no wheezing or rales Chest wall: without tenderness to palpitation Heart: PMI not displaced, RRR, s1 s2 normal, 1/6 systolic murmur, no diastolic murmur, no rubs, gallops, thrills, or heaves Abdomen: soft, nontender; no  hepatosplenomehaly, BS+; abdominal aorta nontender and not dilated by palpation. Back: no CVA tenderness Pulses 2+ Musculoskeletal: full range of motion, normal strength, no joint deformities Extremities: no clubbing cyanosis or edema, Homan's sign negative  Neurologic: grossly nonfocal; Cranial nerves grossly wnl Psychologic: Normal mood and affect  EKG Interpretation Date/Time:  Wednesday June 23 2023 14:48:52 EST Ventricular Rate:  62 PR Interval:  150 QRS Duration:  120 QT Interval:  388 QTC Calculation: 393 R Axis:   -1  Text Interpretation: Normal sinus rhythm Right bundle branch block When compared with ECG of 03-Mar-2023 13:23, No significant change was found Confirmed by Nicki Guadalajara ([id number]) on 06/30/2023 6:28:10 PM     October 20, 2021 ECG (independently read by me):  Sinus rhythm at 77, PAC, IRBBB,     April 07, 2021 ECG (independently read by me): NSR at 75, RBBB, no ectopy  December 2019 ECG (independently read by me): Sinus rhythm at 78 bpm.  Mild sinus arrhythmia.  Right bundle branch block with repolarization changes.  April 2019 ECG (independently read by me): Normal sinus rhythm at 83 bpm.  Right bundle branch block with repolarization changes.  QTc interval 4 6 0 ms.  September 2018 ECG (independently read by me): normal sinus rhythm at 79 bpm.  Right bundle-branch block.  QTc interval 481 ms.  June 2016 ECG (independently read by me):  Normal sinus rhythm with right bundle branch block.  October 2015 ECG (independently read by me): Normal sinus rhythm at 71 beats per minute right bundle branch block with repolarization changes.  Normal intervals.  Prior July 2014 ECG sinus rhythm with incomplete right bundle branch block. QRS duration 118 ms. Nonspecific ST-T changes.  LABS:    Latest Ref Rng & Units 01/04/2023    8:39 PM 12/30/2022   12:59 PM 12/29/2022    3:41 AM  BMP  Glucose 70 - 99 mg/dL 914  782  956   BUN 8 - 23 mg/dL 10  14  16    Creatinine  0.44 - 1.00 mg/dL 2.13  0.86  5.78   Sodium 135 - 145 mmol/L 135  135  131   Potassium 3.5 - 5.1 mmol/L 4.7  3.5  3.8   Chloride 98 - 111 mmol/L 100  102  99   CO2 22 - 32 mmol/L 26  23  26    Calcium 8.9 - 10.3 mg/dL 8.9  8.7  8.5       Latest Ref Rng & Units 01/04/2023    8:39 PM 12/30/2022   12:59 PM 12/29/2022    3:41 AM  Hepatic Function  Total Protein 6.5 - 8.1 g/dL 6.0  5.4  4.6   Albumin 3.5 - 5.0 g/dL 2.7  2.2  2.0   AST 15 - 41 U/L 21  38  13   ALT 0 - 44 U/L 12  24  16  Alk Phosphatase 38 - 126 U/L 68  138  56   Total Bilirubin 0.3 - 1.2 mg/dL 0.3  0.5  0.6       Latest Ref Rng & Units 01/04/2023    8:39 PM 12/30/2022   12:59 PM 12/29/2022    3:41 AM  CBC  WBC 4.0 - 10.5 K/uL 11.5  12.0  15.7   Hemoglobin 12.0 - 15.0 g/dL 56.2  13.0  9.4   Hematocrit 36.0 - 46.0 % 31.9  31.8  28.4   Platelets 150 - 400 K/uL 261  227  190    Lab Results  Component Value Date   MCV 94.4 01/04/2023   MCV 93.0 12/30/2022   MCV 91.9 12/29/2022   Lab Results  Component Value Date   TSH 2.32 09/17/2022    Lab Results  Component Value Date   HGBA1C 5.8 (H) 12/22/2022    Lipid Panel     Component Value Date/Time   CHOL 124 09/17/2022 1049   TRIG 102.0 09/17/2022 1049   HDL 47.00 09/17/2022 1049   CHOLHDL 3 09/17/2022 1049   VLDL 20.4 09/17/2022 1049   LDLCALC 57 09/17/2022 1049   LDLDIRECT 139.0 08/15/2019 1114      RADIOLOGY: No results found.  IMPRESSION:  1. OSA (obstructive sleep apnea) on BiPAP   2. Essential hypertension   3. Right bundle branch block   4. Hyperlipidemia with target LDL less than 70   5. Psoriatic arthritis (HCC) -Dr. Nickola Major   6. 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    She sees Dr. Zenovia Jordan for rheumatology and had seen Dr. Matthias Hughs for her lymphocytic colitis.  She had broken her ankle remotely which had significantly limited her mobility and ability to walk and exercise.  She has had significant difficulty requiring a longstanding hospitalization with GI issues.  In addition she has been plagued by a long COVID syndrome and her initial protracted COVID infection from November 2020.  She had issues with wound care following an infection in her left foot.  He had another episode of COVID this past summer.  Remotely she had a DreamStation auto BiPAP which was under recall.  She subsequently received a new ResMed AirCurve via auto unit with set up date on December 08, 2022.  [employer] is her DME company.  She continues to use BiPAP with 100% compliance with average use of 7 hours and 30 minutes.  However, AHI continues to be significantly elevated.  Her unit had been set at a minimum EPAP of 10, pressure support of 5, maximum IPAP of 25.  However, she had a ramp time of 20 cm with ramp start EPAP at 5.  Since I last saw her, she typically has been setting her machine off at least 3 times per night and resuming it at a much lower pressure.  She was having some issues with different masks.  I suspect for an hour, she is been on a significantly reduced EPAP due to ramp time.  Consequently, I am changing her ramp time from 20 minutes down to 15 and changing her ramp start EPAP pressure from 5 up to 8 cm.  I will then change  her minimum EPAP pressure from 10 to 13 cm of water and will continue a 5 cm pressure support.  As result once ramp time is over pressure will initiate at 18/13.  Her most recent 95th percentile pressure is 24.1/19.1 with maximum average pressure at 24.8/19.7.  In the office today, I provided her with 3 different sample masks including an F30, and F40, and an AirTouch F20 mask.  She will try these message hopefully 1 of these will be most optimal for her continued  use.  Blood pressure today is stable at 108/64.  She continues to be on carvedilol 6.25 mg twice a day and lisinopril 20 mg daily.  She has right bundle branch block.  She continues to be on rosuvastatin milligrams for hyperlipidemia.  She takes omeprazole with her GI history and methotrexate for psoriatic arthritis.  I will see her in   Lennette Bihari, MD, [facility], ABSM Diplomate, American Board of Sleep Medicine   06/30/2023 6:41 PM

## 2023-07-01 ENCOUNTER — Encounter: Payer: Self-pay | Admitting: Internal Medicine

## 2023-07-01 DIAGNOSIS — N3000 Acute cystitis without hematuria: Secondary | ICD-10-CM

## 2023-07-01 DIAGNOSIS — R5383 Other fatigue: Secondary | ICD-10-CM

## 2023-07-02 ENCOUNTER — Other Ambulatory Visit (INDEPENDENT_AMBULATORY_CARE_PROVIDER_SITE_OTHER): Payer: Medicare Other

## 2023-07-02 DIAGNOSIS — R5383 Other fatigue: Secondary | ICD-10-CM | POA: Diagnosis not present

## 2023-07-02 DIAGNOSIS — M1009 Idiopathic gout, multiple sites: Secondary | ICD-10-CM | POA: Diagnosis not present

## 2023-07-02 DIAGNOSIS — M1991 Primary osteoarthritis, unspecified site: Secondary | ICD-10-CM | POA: Diagnosis not present

## 2023-07-02 DIAGNOSIS — Z79899 Other long term (current) drug therapy: Secondary | ICD-10-CM | POA: Diagnosis not present

## 2023-07-02 DIAGNOSIS — L408 Other psoriasis: Secondary | ICD-10-CM | POA: Diagnosis not present

## 2023-07-02 DIAGNOSIS — E669 Obesity, unspecified: Secondary | ICD-10-CM | POA: Diagnosis not present

## 2023-07-02 DIAGNOSIS — N3000 Acute cystitis without hematuria: Secondary | ICD-10-CM

## 2023-07-02 DIAGNOSIS — L4059 Other psoriatic arthropathy: Secondary | ICD-10-CM | POA: Diagnosis not present

## 2023-07-02 DIAGNOSIS — Z6831 Body mass index (BMI) 31.0-31.9, adult: Secondary | ICD-10-CM | POA: Diagnosis not present

## 2023-07-02 DIAGNOSIS — K5289 Other specified noninfective gastroenteritis and colitis: Secondary | ICD-10-CM | POA: Diagnosis not present

## 2023-07-02 LAB — URINALYSIS, ROUTINE W REFLEX MICROSCOPIC
Bilirubin Urine: NEGATIVE
Hgb urine dipstick: NEGATIVE
Ketones, ur: NEGATIVE
Nitrite: NEGATIVE
Specific Gravity, Urine: 1.02 (ref 1.000–1.030)
Total Protein, Urine: NEGATIVE
Urine Glucose: NEGATIVE
Urobilinogen, UA: 0.2 (ref 0.0–1.0)
pH: 6 (ref 5.0–8.0)

## 2023-07-02 LAB — COMPREHENSIVE METABOLIC PANEL
ALT: 27 U/L (ref 0–35)
AST: 28 U/L (ref 0–37)
Albumin: 3.6 g/dL (ref 3.5–5.2)
Alkaline Phosphatase: 81 U/L (ref 39–117)
BUN: 21 mg/dL (ref 6–23)
CO2: 30 meq/L (ref 19–32)
Calcium: 10.2 mg/dL (ref 8.4–10.5)
Chloride: 98 meq/L (ref 96–112)
Creatinine, Ser: 0.94 mg/dL (ref 0.40–1.20)
GFR: 58.36 mL/min — ABNORMAL LOW (ref >60.00)
Glucose, Bld: 137 mg/dL — ABNORMAL HIGH (ref 70–99)
Potassium: 4.4 meq/L (ref 3.5–5.1)
Sodium: 133 meq/L — ABNORMAL LOW (ref 135–145)
Total Bilirubin: 0.3 mg/dL (ref 0.2–1.2)
Total Protein: 6.2 g/dL (ref 6.0–8.3)

## 2023-07-02 LAB — CBC WITH DIFFERENTIAL/PLATELET
Basophils Absolute: 0 10*3/uL (ref 0.0–0.1)
Basophils Relative: 0.3 % (ref 0.0–3.0)
Eosinophils Absolute: 0.4 10*3/uL (ref 0.0–0.7)
Eosinophils Relative: 4.2 % (ref 0.0–5.0)
HCT: 34.6 % — ABNORMAL LOW (ref 36.0–46.0)
Hemoglobin: 11.2 g/dL — ABNORMAL LOW (ref 12.0–15.0)
Lymphocytes Relative: 18.7 % (ref 12.0–46.0)
Lymphs Abs: 1.8 10*3/uL (ref 0.7–4.0)
MCHC: 32.3 g/dL (ref 30.0–36.0)
MCV: 91.6 fL (ref 78.0–100.0)
Monocytes Absolute: 0.4 10*3/uL (ref 0.1–1.0)
Monocytes Relative: 4.4 % (ref 3.0–12.0)
Neutro Abs: 7 10*3/uL (ref 1.4–7.7)
Neutrophils Relative %: 72.4 % (ref 43.0–77.0)
Platelets: 209 10*3/uL (ref 150.0–400.0)
RBC: 3.78 Mil/uL — ABNORMAL LOW (ref 3.87–5.11)
RDW: 20.2 % — ABNORMAL HIGH (ref 11.5–15.5)
WBC: 9.7 10*3/uL (ref 4.0–10.5)

## 2023-07-02 LAB — TSH: TSH: 4.6 u[IU]/mL (ref 0.35–5.50)

## 2023-07-03 LAB — URINE CULTURE: Result:: NO GROWTH

## 2023-07-05 NOTE — Therapy (Signed)
OUTPATIENT PHYSICAL THERAPY FEMALE PELVIC EVALUATION   Patient Name: [name] [name] MRN: [id number] DOB:April 05, 1946, 77 y.o., female Today's Date: 07/06/2023  END OF SESSION:  PT End of Session - 07/06/23 1604     Visit Number 1    PT Start Time 1410    PT Stop Time 1450    PT Time Calculation (min) 40 min    Activity Tolerance Patient tolerated treatment well    Behavior During Therapy [facility] for tasks assessed/performed             Past Medical History:  Diagnosis Date   Anemia    Anxiety    Arthritis    Closed fracture of fifth metatarsal bone 05/13/2018   Deep vein thrombosis (HCC)    right calf - 05/2012    Depression    Diabetes mellitus without complication (HCC)    diet controlled    GERD (gastroesophageal reflux disease)    Hyperlipidemia    Patient denies.   Hypertension    Ejection fraction =>55% Left ventricular systolic function is normal. Left ventricular wall motion is normal     Lymphocytic colitis    Neuropathy    diabetic - in bilateral feet    Pityriasis lichenoides chronica    Pneumonia    Sleep apnea    bipap   Past Surgical History:  Procedure Laterality Date   BILIARY STENT PLACEMENT  12/28/2022   Procedure: BILIARY STENT PLACEMENT;  Surgeon: Iva Boop, MD;  Location: [facility] ENDOSCOPY;  Service: Gastroenterology;;   BIOPSY  11/09/2022   Procedure: BIOPSY;  Surgeon: Lemar Lofty., MD;  Location: [facility] ENDOSCOPY;  Service: Gastroenterology;;   CHOLECYSTECTOMY N/A 12/23/2022   Procedure: LAPAROSCOPIC CHOLECYSTECTOMY WITH ICG DYE;  Surgeon: Gaynelle Adu, MD;  Location: [facility] ORS;  Service: General;  Laterality: N/A;   DILATION AND CURETTAGE OF UTERUS     ENDOSCOPIC RETROGRADE CHOLANGIOPANCREATOGRAPHY (ERCP) WITH PROPOFOL N/A 12/28/2022   Procedure: ENDOSCOPIC RETROGRADE CHOLANGIOPANCREATOGRAPHY (ERCP) WITH PROPOFOL;  Surgeon: Iva Boop, MD;  Location[facility] ENDOSCOPY;  Service: Gastroenterology;  Laterality: N/A;   ENDOSCOPIC RETROGRADE  CHOLANGIOPANCREATOGRAPHY (ERCP) WITH PROPOFOL N/A 02/22/2023   Procedure: ENDOSCOPIC RETROGRADE CHOLANGIOPANCREATOGRAPHY (ERCP) WITH PROPOFOL;  Surgeon: Iva Boop, MD;  Location: [facility] ENDOSCOPY;  Service: Gastroenterology;  Laterality: N/A;   ESOPHAGOGASTRODUODENOSCOPY N/A 11/09/2022   Procedure: ESOPHAGOGASTRODUODENOSCOPY (EGD);  Surgeon: Lemar Lofty., MD;  Location: [facility] ENDOSCOPY;  Service: Gastroenterology;  Laterality: N/A;   EYE SURGERY  07/2020   GASTROINTESTINAL STENT REMOVAL N/A 02/22/2023   Procedure: GASTROINTESTINAL STENT REMOVAL;  Surgeon: Iva Boop, MD;  Location: [facility] ENDOSCOPY;  Service: Gastroenterology;  Laterality: N/A;   HAMMER TOE SURGERY     right hand surgery      due to blood infection    torn meniscus repair      right knee    TOTAL HIP ARTHROPLASTY  09/13/2012   Procedure: TOTAL HIP ARTHROPLASTY ANTERIOR APPROACH;  Surgeon: Shelda Pal, MD;  Location: [facility] ORS;  Service: Orthopedics;  Laterality: Right;   UPPER ESOPHAGEAL ENDOSCOPIC ULTRASOUND (EUS) N/A 11/09/2022   Procedure: UPPER ESOPHAGEAL ENDOSCOPIC ULTRASOUND (EUS);  Surgeon: Lemar Lofty., MD;  Location: [facility] ENDOSCOPY;  Service: Gastroenterology;  Laterality: N/A;   Patient Active Problem List   Diagnosis Date Noted   Long COVID 03/17/2023   Bile leak 12/28/2022   Abnormal finding on imaging of liver 12/28/2022   Abdominal fluid collection 12/26/2022   History of peptic ulcer 09/17/2022   GERD (gastroesophageal reflux disease)  09/16/2022   Chronic low back pain 07/26/2022   Trochanteric bursitis of right hip 05/03/2022   Pain in joint of right hip 04/22/2022   Osteoarthritis of knees, bilateral 04/07/2022   Degeneration of lumbar intervertebral disc 01/01/2022   Lumbar spondylosis 12/04/2021   Pain due to onychomycosis of toenails of both feet 10/21/2021   Pain in finger of left hand 09/03/2021   Lumbar pain 05/20/2021   Chronic hiccups 05/01/2021   Memory loss 05/01/2021    History of COVID-19 05/01/2021   Olfactory impairment 05/01/2021   SOB (shortness of breath) 07/23/2020   Dyslipidemia 11/12/2019   Pityriasis lichenoides chronica 09/18/2019   Insomnia 08/07/2019   Anxiety 08/07/2019   Psoriatic arthritis (HCC) -Dr. Nickola Major 08/07/2019   Gout 08/07/2019   Charcot's joint of left foot 08/07/2019   Depression 08/07/2019   Pneumonia due to COVID-19 virus 07/09/2019   Physical deconditioning 07/08/2019   COVID-19 07/07/2019   Diabetic peripheral neuropathy (HCC) 04/10/2019   Osteoarthritis of midfoot 04/10/2019   Pain in left knee 10/13/2018   Urinary incontinence 07/21/2018   DM2 (diabetes mellitus, type 2) (HCC) 05/02/2018   Psoriasis 05/02/2018   Osteoarthritis of knee 10/18/2017   Right bundle branch block 05/20/2014   OSA (obstructive sleep apnea), BiPAP 03/12/2013   Essential hypertension 03/12/2013   Obesity 09/14/2012   Hyponatremia 09/14/2012   S/P right THA, AA 09/13/2012   Lymphocytic-plasmacytic colitis 08/11/2007    PCP: Pincus Sanes, MD   REFERRING PROVIDER: Jaci Standard, MD   REFERRING DIAG:  Diagnosis  R15.9 (ICD-10-CM) - Encopresis  R11.0 (ICD-10-CM) - Nausea without vomiting  K21.9 (ICD-10-CM) - Gastroesophageal reflux disease, unspecified whether esophagitis present    THERAPY DIAG:  Other low back pain  Muscle weakness (generalized)  Other lack of coordination  Rationale for Evaluation and Treatment: Rehabilitation  ONSET DATE: SUI since 2024, bad since April 2024  SUBJECTIVE:                                                                                                                                                                                           SUBJECTIVE STATEMENT: Pt reports that she is being seen for urinary incontinence. She has gallbladder out in April 2024 and since then she has got worse. She had a horrible time with her surgery, a liter of bile spilled in her abdomen adn it felt  like acid. 2 muscles at the base of her back died. Gets ablation every six months d/t pain, if she is doing too much or walking too far, it gets aggravated. Reports that she had to be off her feet for  a year and got weak. Pt reports that she has a lot of neuropathy. Idiopathic neuropathy. Recently got diagnosed with lymphocytic colitis.Is sometimes constipated.  Fluid intake: Yes: - to be asked about amount    PAIN:  Are you having pain? Yes NPRS scale: 6/10 Pain location:  low back  Pain type: aching low back- DDD and arthritis Pain description: constant   Aggravating factors: too much sitting, walking Relieving factors: rest  PRECAUTIONS: None  RED FLAGS: None   WEIGHT BEARING RESTRICTIONS: No  FALLS:  Has patient fallen in last 6 months? No  LIVING ENVIRONMENT: Lives with: lives with their family and lives alone Lives in: House/apartment Stairs: No Has following equipment at home: Single point cane  OCCUPATION: retired  PLOF: Independent, walks with a cane, arthritis everywhere  PATIENT GOALS: to have less urinary incontinence  PERTINENT HISTORY:   Cholecystectomy 12/23/22 Biliary stent placement  12/28/22 Biopsy 11/09/2022  Sexual abuse: No  BOWEL MOVEMENT: Pain with bowel movement: No Type of bowel movement:Type (Bristol Stool Scale) 2-4 Fully empty rectum: Yes: but GI x ray showed quiet bit of fecal matter. Feels constipated Leakage: yes- sometimes Pads: Yes:   Fiber supplement: No  URINATION: Pain with urination: Yes recently with infection, better now Fully empty bladder: Yes:   Stream: Strong and Weak Urgency: Yes: 2-3 hours, 2/ night Frequency: 2-3 hours Leakage: Urge to void, Walking to the bathroom, Coughing, Sneezing, Laughing, Exercise, Lifting, and Bending forward Pads: Yes: pullup  INTERCOURSE: N/A  PREGNANCY: Vaginal deliveries 2 Tearing Yes:   C-section deliveries 0 Currently pregnant No  PROLAPSE: None   OBJECTIVE:  Note:  Objective measures were completed at Evaluation unless otherwise noted.  DIAGNOSTIC FINDINGS:  X ray- for constipation- showed stool in colon   COGNITION: Overall cognitive status: Within functional limits for tasks assessed     SENSATION: Light touch: Deficits decreased sensation bilateral LEs Proprioception: Deficits decreased sensation bilateral LEs  MUSCLE LENGTH: Hamstrings: Right 70 deg; Left 70 deg   LUMBAR SPECIAL TESTS:  Stork standing: Positive- pt unable to d/t poor balance    GAIT: Distance walked: 30 Assistive device utilized: Single point cane Level of assistance: Complete Independence Comments: poor balance, used SPC  POSTURE: rounded shoulders, forward head, decreased lumbar lordosis, and increased thoracic kyphosis  PELVIC ALIGNMENT:seems even  LUMBARAROM/PROM: pt with difficulty demonstrating in standing d/t fear of falling  A/PROM A/PROM  eval  Flexion limited  Extension   Right lateral flexion   Left lateral flexion   Right rotation   Left rotation    (Blank rows = not tested)  LOWER EXTREMITY ROM:  Active ROM Right eval Left eval  Hip flexion tight tight  Hip extension    Hip abduction    Hip adduction    Hip internal rotation    Hip external rotation    Knee flexion    Knee extension    Ankle dorsiflexion    Ankle plantarflexion    Ankle inversion    Ankle eversion     (Blank rows = not tested)  LOWER EXTREMITY MMT: at least 3/5 throughout  PALPATION:   General  restrictions throughout abdomen, tight abdominal umbilical (not tender)                External Perineal Exam to be assessed                             Internal Pelvic Floor - pt  did not want internal today- will do as a homework and report  Patient confirms identification and approves PT to assess internal pelvic floor and treatment No  PELVIC MMT:   MMT eval  Vaginal   Internal Anal Sphincter   External Anal Sphincter   Puborectalis   Diastasis Recti    (Blank rows = not tested)        TONE: Low tone abdomen, bulges when asked for contraction, poor coordination with breath  PROLAPSE: To be assessed  TODAY'S TREATMENT:                                                                                                                              DATE:  07/06/2023  EVAL see below   PATIENT EDUCATION:  Education details: relevant anatomy, internal assessment Person educated: Patient Education method: Explanation, Demonstration, Tactile cues, Verbal cues, and Handouts Education comprehension: verbalized understanding and needs further education  HOME EXERCISE PROGRAM:  [id number]  ASSESSMENT:  CLINICAL IMPRESSION: Patient is a 77 y.o. F who was seen today for physical therapy evaluation and treatment for urinary incontinence.She has several commorbidites, recently diagnosed with lymphatic colitis. She has peripheral neuropathy, weak abdominal wall, weakness throughout her core, difficulty with standing, sitting and ambulation and will benefit from PT to reduce UI.   OBJECTIVE IMPAIRMENTS: Abnormal gait, decreased activity tolerance, decreased balance, decreased coordination, decreased endurance, decreased knowledge of condition, decreased mobility, difficulty walking, decreased ROM, decreased strength, increased fascial restrictions, impaired perceived functional ability, increased muscle spasms, impaired flexibility, impaired sensation, impaired tone, and pain.   ACTIVITY LIMITATIONS: carrying, lifting, bending, sitting, standing, squatting, sleeping, stairs, transfers, bed mobility, continence, toileting, hygiene/grooming, and caring for others  PARTICIPATION LIMITATIONS: laundry, driving, community activity, and yard work  PERSONAL FACTORS: Age, Fitness, Time since onset of injury/illness/exacerbation, and 1-2 comorbidities: colitis, neuropathy  are also affecting patient's functional outcome.   REHAB POTENTIAL: Fair    CLINICAL  DECISION MAKING: Stable/uncomplicated  EVALUATION COMPLEXITY: Low   GOALS: Goals reviewed with patient? Yes  SHORT TERM GOALS: Target date: 08/03/2023    Pt will be I with initial HEP Baseline: Goal status: INITIAL  2.  Pt will report reduced UI by at least 50% Baseline:  Goal status: INITIAL  3.  Pt will report reduced LBP to max 3/10 with her HEP Baseline:  Goal status: INITIAL   LONG TERM GOALS: Target date: 08/31/2023    Pt will soak 0 pads/ day Baseline:  Goal status: INITIAL  2.  Pt will report no incidents of fecal incontinence Baseline:  Goal status: INITIAL  3.  Pt will be I with abdominal massage and use of squatty potty Baseline:  Goal status: INITIAL  4.  T will be I with advanced HEP and dem all exercises correctly Baseline:  Goal status: INITIAL    PLAN:  PT FREQUENCY: 1-2x/week  PT DURATION: 8 weeks  PLANNED INTERVENTIONS: 97110-Therapeutic exercises, 97530- Therapeutic activity, [id number]- Neuromuscular re-education, 97535-  Self Care, [id number]- Manual therapy, [phone]- Electrical stimulation (manual), Taping, Dry Needling, Joint mobilization, Joint manipulation, Spinal manipulation, Spinal mobilization, Scar mobilization, and Biofeedback  PLAN FOR NEXT SESSION: pelvic floor and core exercises, abdominal massage education, constipation management strategies   Kharter Brew, PT 07/06/23 4:33 PM

## 2023-07-06 ENCOUNTER — Ambulatory Visit: Payer: Medicare Other | Attending: Hematology and Oncology | Admitting: Physical Therapy

## 2023-07-06 ENCOUNTER — Encounter: Payer: Self-pay | Admitting: Physical Therapy

## 2023-07-06 DIAGNOSIS — M5459 Other low back pain: Secondary | ICD-10-CM

## 2023-07-06 DIAGNOSIS — R159 Full incontinence of feces: Secondary | ICD-10-CM | POA: Diagnosis not present

## 2023-07-06 DIAGNOSIS — R278 Other lack of coordination: Secondary | ICD-10-CM

## 2023-07-06 DIAGNOSIS — M6281 Muscle weakness (generalized): Secondary | ICD-10-CM

## 2023-07-07 DIAGNOSIS — L4059 Other psoriatic arthropathy: Secondary | ICD-10-CM | POA: Diagnosis not present

## 2023-07-09 ENCOUNTER — Other Ambulatory Visit: Payer: Self-pay | Admitting: Internal Medicine

## 2023-07-09 ENCOUNTER — Encounter: Payer: Self-pay | Admitting: Internal Medicine

## 2023-07-12 ENCOUNTER — Other Ambulatory Visit: Payer: Self-pay

## 2023-07-12 MED ORDER — LOPERAMIDE HCL 2 MG PO CAPS: 2.0000 mg | ORAL_CAPSULE | Freq: Every day | ORAL | 5 refills | Status: DC

## 2023-07-13 ENCOUNTER — Other Ambulatory Visit: Payer: Self-pay

## 2023-07-13 ENCOUNTER — Encounter: Payer: Medicare Other | Admitting: Physical Therapy

## 2023-07-13 MED ORDER — LOPERAMIDE HCL 2 MG PO CAPS: 2.0000 mg | ORAL_CAPSULE | Freq: Every day | ORAL | 3 refills | Status: DC

## 2023-07-16 ENCOUNTER — Ambulatory Visit: Payer: Medicare Other | Admitting: Podiatry

## 2023-07-19 ENCOUNTER — Encounter: Payer: Self-pay | Admitting: Internal Medicine

## 2023-07-20 ENCOUNTER — Ambulatory Visit: Payer: Medicare Other | Attending: Hematology and Oncology | Admitting: Physical Therapy

## 2023-07-24 DIAGNOSIS — M5416 Radiculopathy, lumbar region: Secondary | ICD-10-CM | POA: Diagnosis not present

## 2023-07-27 ENCOUNTER — Ambulatory Visit: Payer: Medicare Other | Attending: Hematology and Oncology | Admitting: Physical Therapy

## 2023-07-27 ENCOUNTER — Encounter: Payer: Self-pay | Admitting: Physical Therapy

## 2023-07-27 DIAGNOSIS — R41841 Cognitive communication deficit: Secondary | ICD-10-CM | POA: Diagnosis not present

## 2023-07-27 DIAGNOSIS — R278 Other lack of coordination: Secondary | ICD-10-CM | POA: Diagnosis not present

## 2023-07-27 DIAGNOSIS — M6281 Muscle weakness (generalized): Secondary | ICD-10-CM | POA: Insufficient documentation

## 2023-07-27 DIAGNOSIS — R262 Difficulty in walking, not elsewhere classified: Secondary | ICD-10-CM | POA: Insufficient documentation

## 2023-07-27 DIAGNOSIS — M5459 Other low back pain: Secondary | ICD-10-CM | POA: Diagnosis not present

## 2023-07-27 NOTE — Therapy (Signed)
OUTPATIENT PHYSICAL THERAPY FEMALE PELVIC TREATMENT   Patient Name: [name] [name] MRN: [id number] DOB:Oct 09, 1945, 77 y.o., female Today's Date: 07/27/2023  END OF SESSION:  PT End of Session - 07/27/23 1525     Visit Number 2    Date for PT Re-Evaluation 08/31/23    Authorization Type Medicare    Authorization Time Period 07/06/23-08/31/23    PT Start Time 1455    PT Stop Time 1520    PT Time Calculation (min) 25 min    Activity Tolerance Patient tolerated treatment well    Behavior During Therapy [facility] for tasks assessed/performed              Past Medical History:  Diagnosis Date   Anemia    Anxiety    Arthritis    Closed fracture of fifth metatarsal bone 05/13/2018   Deep vein thrombosis (HCC)    right calf - 05/2012    Depression    Diabetes mellitus without complication (HCC)    diet controlled    GERD (gastroesophageal reflux disease)    Hyperlipidemia    Patient denies.   Hypertension    Ejection fraction =>55% Left ventricular systolic function is normal. Left ventricular wall motion is normal     Lymphocytic colitis    Neuropathy    diabetic - in bilateral feet    Pityriasis lichenoides chronica    Pneumonia    Sleep apnea    bipap   Past Surgical History:  Procedure Laterality Date   BILIARY STENT PLACEMENT  12/28/2022   Procedure: BILIARY STENT PLACEMENT;  Surgeon: Iva Boop, MD;  Location: [facility] ENDOSCOPY;  Service: Gastroenterology;;   BIOPSY  11/09/2022   Procedure: BIOPSY;  Surgeon: Lemar Lofty., MD;  Location: [facility] ENDOSCOPY;  Service: Gastroenterology;;   CHOLECYSTECTOMY N/A 12/23/2022   Procedure: LAPAROSCOPIC CHOLECYSTECTOMY WITH ICG DYE;  Surgeon: Gaynelle Adu, MD;  Location: [facility] ORS;  Service: General;  Laterality: N/A;   DILATION AND CURETTAGE OF UTERUS     ENDOSCOPIC RETROGRADE CHOLANGIOPANCREATOGRAPHY (ERCP) WITH PROPOFOL N/A 12/28/2022   Procedure: ENDOSCOPIC RETROGRADE CHOLANGIOPANCREATOGRAPHY (ERCP) WITH PROPOFOL;  Surgeon:  Iva Boop, MD;  Location: [facility] ENDOSCOPY;  Service: Gastroenterology;  Laterality: N/A;   ENDOSCOPIC RETROGRADE CHOLANGIOPANCREATOGRAPHY (ERCP) WITH PROPOFOL N/A 02/22/2023   Procedure: ENDOSCOPIC RETROGRADE CHOLANGIOPANCREATOGRAPHY (ERCP) WITH PROPOFOL;  Surgeon: Iva Boop, MD;  Location: [facility] ENDOSCOPY;  Service: Gastroenterology;  Laterality: N/A;   ESOPHAGOGASTRODUODENOSCOPY N/A 11/09/2022   Procedure: ESOPHAGOGASTRODUODENOSCOPY (EGD);  Surgeon: Lemar Lofty., MD;  Location: [facility] ENDOSCOPY;  Service: Gastroenterology;  Laterality: N/A;   EYE SURGERY  07/2020   GASTROINTESTINAL STENT REMOVAL N/A 02/22/2023   Procedure: GASTROINTESTINAL STENT REMOVAL;  Surgeon: Iva Boop, MD;  Location: [facility] ENDOSCOPY;  Service: Gastroenterology;  Laterality: N/A;   HAMMER TOE SURGERY     right hand surgery      due to blood infection    torn meniscus repair      right knee    TOTAL HIP ARTHROPLASTY  09/13/2012   Procedure: TOTAL HIP ARTHROPLASTY ANTERIOR APPROACH;  Surgeon: Shelda Pal, MD;  Location: [facility] ORS;  Service: Orthopedics;  Laterality: Right;   UPPER ESOPHAGEAL ENDOSCOPIC ULTRASOUND (EUS) N/A 11/09/2022   Procedure: UPPER ESOPHAGEAL ENDOSCOPIC ULTRASOUND (EUS);  Surgeon: Lemar Lofty., MD;  Location: [facility] ENDOSCOPY;  Service: Gastroenterology;  Laterality: N/A;   Patient Active Problem List   Diagnosis Date Noted   Long COVID 03/17/2023   Bile leak 12/28/2022   Abnormal finding on imaging  of liver 12/28/2022   Abdominal fluid collection 12/26/2022   History of peptic ulcer 09/17/2022   GERD (gastroesophageal reflux disease) 09/16/2022   Chronic low back pain 07/26/2022   Trochanteric bursitis of right hip 05/03/2022   Pain in joint of right hip 04/22/2022   Osteoarthritis of knees, bilateral 04/07/2022   Degeneration of lumbar intervertebral disc 01/01/2022   Lumbar spondylosis 12/04/2021   Pain due to onychomycosis of toenails of both feet 10/21/2021   Pain in  finger of left hand 09/03/2021   Lumbar pain 05/20/2021   Chronic hiccups 05/01/2021   Memory loss 05/01/2021   History of COVID-19 05/01/2021   Olfactory impairment 05/01/2021   SOB (shortness of breath) 07/23/2020   Dyslipidemia 11/12/2019   Pityriasis lichenoides chronica 09/18/2019   Insomnia 08/07/2019   Anxiety 08/07/2019   Psoriatic arthritis (HCC) -Dr. Nickola Major 08/07/2019   Gout 08/07/2019   Charcot's joint of left foot 08/07/2019   Depression 08/07/2019   Pneumonia due to COVID-19 virus 07/09/2019   Physical deconditioning 07/08/2019   COVID-19 07/07/2019   Diabetic peripheral neuropathy (HCC) 04/10/2019   Osteoarthritis of midfoot 04/10/2019   Pain in left knee 10/13/2018   Urinary incontinence 07/21/2018   DM2 (diabetes mellitus, type 2) (HCC) 05/02/2018   Psoriasis 05/02/2018   Osteoarthritis of knee 10/18/2017   Right bundle branch block 05/20/2014   OSA (obstructive sleep apnea), BiPAP 03/12/2013   Essential hypertension 03/12/2013   Obesity 09/14/2012   Hyponatremia 09/14/2012   S/P right THA, AA 09/13/2012   Lymphocytic-plasmacytic colitis 08/11/2007    PCP: Pincus Sanes, MD   REFERRING PROVIDER: Jaci Standard, MD   REFERRING DIAG:  Diagnosis  R15.9 (ICD-10-CM) - Encopresis  R11.0 (ICD-10-CM) - Nausea without vomiting  K21.9 (ICD-10-CM) - Gastroesophageal reflux disease, unspecified whether esophagitis present    THERAPY DIAG:  Other low back pain  Difficulty in walking, not elsewhere classified  Cognitive communication deficit  Muscle weakness (generalized)  Other lack of coordination  Rationale for Evaluation and Treatment: Rehabilitation  ONSET DATE: SUI since 2024, bad since April 2024  SUBJECTIVE:                                                                                                                                                                                           SUBJECTIVE STATEMENT: Pt reports that she lot  the key to her house, She reports that she is not doing well today, she has severe back pain, had to get an epidural on Saturday. Feels like she should not be here today. Feels like she needs to wait after  the holidays.  Pt  reports that she has had lymphocytic colitis for a year. Has been doing well with constipation the last couple of weeks. Wants to mainly work on urinary incontinence  Fluid intake: Yes: - 1 cup of coffee, water, sugar free gatorade    PAIN:  Are you having pain? Yes NPRS scale: 6/10 Pain location:  low back  Pain type: aching low back- DDD and arthritis Pain description: constant   Aggravating factors: too much sitting, walking Relieving factors: rest  PRECAUTIONS: None  RED FLAGS: None   WEIGHT BEARING RESTRICTIONS: No  FALLS:  Has patient fallen in last 6 months? No  LIVING ENVIRONMENT: Lives with: her husband Lives in: House/apartment Stairs: No Has following equipment at home: quad cane  OCCUPATION: retired  PLOF: Independent, walks with a cane  PATIENT GOALS: to have less urinary incontinence  PERTINENT HISTORY:   Cholecystectomy 12/23/22 Biliary stent placement  12/28/22 Biopsy 11/09/2022  Sexual abuse: No  BOWEL MOVEMENT: Pain with bowel movement: No Type of bowel movement:Type (Bristol Stool Scale) 2-4 Fully empty rectum: Yes: but GI x ray showed quiet bit of fecal matter. Feels constipated Leakage: yes- sometimes Pads: Yes:   Fiber supplement: No  URINATION: Pain with urination: Yes recently with infection, better now Fully empty bladder: Yes:   Stream: Strong and Weak Urgency: Yes: 2-3 hours, 2/ night Frequency: 2-3 hours Leakage: Urge to void, Walking to the bathroom, Coughing, Sneezing, Laughing, Exercise, Lifting, and Bending forward Pads: Yes: pullup  INTERCOURSE: N/A  PREGNANCY: Vaginal deliveries 2 Tearing Yes:   C-section deliveries 0 Currently pregnant No  PROLAPSE: None   OBJECTIVE:  Note: Objective  measures were completed at Evaluation unless otherwise noted.  DIAGNOSTIC FINDINGS:  X ray- for constipation- showed stool in colon   COGNITION: Overall cognitive status: Within functional limits for tasks assessed     SENSATION: Light touch: Deficits decreased sensation bilateral LEs Proprioception: Deficits decreased sensation bilateral LEs  MUSCLE LENGTH: Hamstrings: Right 70 deg; Left 70 deg   LUMBAR SPECIAL TESTS:  Stork standing: Positive- pt unable to d/t poor balance    GAIT: Distance walked: 30 Assistive device utilized: Single point cane Level of assistance: Complete Independence Comments: poor balance, used SPC  POSTURE: rounded shoulders, forward head, decreased lumbar lordosis, and increased thoracic kyphosis  PELVIC ALIGNMENT:seems even  LUMBARAROM/PROM: pt with difficulty demonstrating in standing d/t fear of falling  A/PROM A/PROM  eval  Flexion limited  Extension   Right lateral flexion   Left lateral flexion   Right rotation   Left rotation    (Blank rows = not tested)  LOWER EXTREMITY ROM:  Active ROM Right eval Left eval  Hip flexion tight tight  Hip extension    Hip abduction    Hip adduction    Hip internal rotation    Hip external rotation    Knee flexion    Knee extension    Ankle dorsiflexion    Ankle plantarflexion    Ankle inversion    Ankle eversion     (Blank rows = not tested)  LOWER EXTREMITY MMT: at least 3/5 throughout  PALPATION:   General  restrictions throughout abdomen, tight abdominal umbilical (not tender)                External Perineal Exam to be assessed                             Internal Pelvic Floor - pt did  not want internal today- will do as a homework and report  Patient confirms identification and approves PT to assess internal pelvic floor and treatment No  PELVIC MMT:   MMT eval  Vaginal   Internal Anal Sphincter   External Anal Sphincter   Puborectalis   Diastasis Recti   (Blank  rows = not tested)        TONE: Low tone abdomen, bulges when asked for contraction, poor coordination with breath  PROLAPSE: To be assessed  TODAY'S TREATMENT:                                                                                                                              DATE:  07/27/2023 There acts- review of HEP, education on relevant  anatomy, expectations of PT,   Neuro reed- seated ball press with transverse abdominis breath 5 reps    PATIENT EDUCATION:  Education details: relevant anatomy, internal assessment Person educated: Patient Education method: Explanation, Demonstration, Tactile cues, Verbal cues, and Handouts Education comprehension: verbalized understanding and needs further education  HOME EXERCISE PROGRAM:  [id number]  ASSESSMENT:  CLINICAL IMPRESSION: Pt comes today after about 3 weeks. She would like to reschedule her appts, seems tired and her back is hurting. She is somewhat overwhelmed as well. Discussed trying her exercises and returning to PT PRN when she is ready. Possibly awaiting right shoulder replacement.    OBJECTIVE IMPAIRMENTS: Abnormal gait, decreased activity tolerance, decreased balance, decreased coordination, decreased endurance, decreased knowledge of condition, decreased mobility, difficulty walking, decreased ROM, decreased strength, increased fascial restrictions, impaired perceived functional ability, increased muscle spasms, impaired flexibility, impaired sensation, impaired tone, and pain.   ACTIVITY LIMITATIONS: carrying, lifting, bending, sitting, standing, squatting, sleeping, stairs, transfers, bed mobility, continence, toileting, hygiene/grooming, and caring for others  PARTICIPATION LIMITATIONS: laundry, driving, community activity, and yard work  PERSONAL FACTORS: Age, Fitness, Time since onset of injury/illness/exacerbation, and 1-2 comorbidities: colitis, neuropathy  are also affecting patient's functional  outcome.   REHAB POTENTIAL: Fair    CLINICAL DECISION MAKING: Stable/uncomplicated  EVALUATION COMPLEXITY: Low   GOALS: Goals reviewed with patient? Yes  SHORT TERM GOALS: Target date: 08/03/2023    Pt will be I with initial HEP Baseline: Goal status: INITIAL  2.  Pt will report reduced UI by at least 50% Baseline:  Goal status: INITIAL  3.  Pt will report reduced LBP to max 3/10 with her HEP Baseline:  Goal status: INITIAL   LONG TERM GOALS: Target date: 08/31/2023    Pt will soak 0 pads/ day Baseline:  Goal status: INITIAL  2.  Pt will report no incidents of fecal incontinence Baseline:  Goal status: INITIAL  3.  Pt will be I with abdominal massage and use of squatty potty Baseline:  Goal status: INITIAL  4.  T will be I with advanced HEP and dem all exercises correctly Baseline:  Goal status: INITIAL    PLAN:  PT FREQUENCY: 1-2x/week  PT DURATION: 8 weeks  PLANNED INTERVENTIONS: 97110-Therapeutic exercises, 97530- Therapeutic activity, 97112- Neuromuscular re-education, 97535- Self Care, 28413- Manual therapy, [phone]- Electrical stimulation (manual), Taping, Dry Needling, Joint mobilization, Joint manipulation, Spinal manipulation, Spinal mobilization, Scar mobilization, and Biofeedback  PLAN FOR NEXT SESSION: pelvic floor and core exercises, abdominal massage education, constipation management strategies   Keyli Duross, PT 07/27/23 3:29 PM

## 2023-08-08 ENCOUNTER — Encounter: Payer: Self-pay | Admitting: Internal Medicine

## 2023-08-09 MED ORDER — NYSTATIN 100000 UNIT/ML MT SUSP: 5.0000 mL | Freq: Three times a day (TID) | OROMUCOSAL | 0 refills | Status: DC

## 2023-08-13 ENCOUNTER — Ambulatory Visit: Payer: Medicare Other | Admitting: Internal Medicine

## 2023-08-15 ENCOUNTER — Encounter: Payer: Self-pay | Admitting: Cardiovascular Disease

## 2023-08-18 DIAGNOSIS — L4059 Other psoriatic arthropathy: Secondary | ICD-10-CM | POA: Diagnosis not present

## 2023-09-01 ENCOUNTER — Other Ambulatory Visit: Payer: Self-pay | Admitting: Cardiovascular Disease

## 2023-09-15 DIAGNOSIS — L821 Other seborrheic keratosis: Secondary | ICD-10-CM | POA: Diagnosis not present

## 2023-09-15 DIAGNOSIS — L400 Psoriasis vulgaris: Secondary | ICD-10-CM | POA: Diagnosis not present

## 2023-09-15 DIAGNOSIS — L718 Other rosacea: Secondary | ICD-10-CM | POA: Diagnosis not present

## 2023-09-15 DIAGNOSIS — I788 Other diseases of capillaries: Secondary | ICD-10-CM | POA: Diagnosis not present

## 2023-09-19 ENCOUNTER — Encounter: Payer: Self-pay | Admitting: Internal Medicine

## 2023-09-19 NOTE — Progress Notes (Signed)
 Subjective:    Patient ID: [name] [name], female    DOB: 20-Apr-1946, 78 y.o.   MRN: [id number]     HPI [name] [name] is here for follow up of her chronic medical problems.  She has chronic pain - lower back on oxycodone  TID, right shoulder - bone on bone per ortho, she had an injection but did not help.  She was advised the only treatment would be total shoulder replacement which she does not want to have  Every day she thinks I hope this is my last day - she denies suicidal thoughts.   Face breakouts - new - saw dermatology - was given a few things to try - have not helped.  She is not sure what the cause.   Nose is itching.  Started around the time of the face breakouts.  She did try an otc anti-histamine x 10 days - it did not help.  Around the same time as the face breakouts and the itching nose started she also noticed that her psoriasis is much worse.  Sometimes have diarrhea, sometimes constipation, sometimes nausea.  This started since her GB issue.  Diarrhea does often come with treatment of constipation.   Increased anxiety.  She was thinking about talking to a therapist, but it is too expensive.    Having warts on fingers.  Discussed with dermatology treatment.  The 1 on the tip of her index finger would require possible surgery and skin graft.  All of this is a lot to deal with.  Medications and allergies reviewed with patient and updated if appropriate.  Current Outpatient Medications on File Prior to Visit  Medication Sig Dispense Refill   acetaminophen  (TYLENOL ) 500 MG tablet Take 1 tablet (500 mg total) by mouth every 6 (six) hours as needed for mild pain or fever.     allopurinol  (ZYLOPRIM ) 100 MG tablet TAKE 1 TABLET BY MOUTH TWICE A DAY 180 tablet 2   ALPRAZolam  (XANAX ) 0.25 MG tablet Take 1 tablet (0.25 mg total) by mouth 2 (two) times daily as needed. 60 tablet 5   augmented betamethasone dipropionate (DIPROLENE-AF) 0.05 % cream Apply topically.      azelastine  (ASTELIN ) 0.1 % nasal spray INSTILL 2 SPRAYS IN EACH NOSTRIL TWICE DAILY 30 mL 12   Biotin 5000 MCG CAPS Take 5,000 mcg by mouth daily.     budesonide  (ENTOCORT EC ) 3 MG 24 hr capsule Take 9 mg by mouth daily.     carvedilol  (COREG ) 6.25 MG tablet Take 1 tablet (6.25 mg total) by mouth 2 (two) times daily. Take 6.25 mg by mouth 2 (two) times daily. 180 tablet 2   cholecalciferol  (VITAMIN D3) 25 MCG (1000 UNIT) tablet Take 1,000 Units by mouth daily.     clobetasol cream (TEMOVATE) 0.05 % Apply 1 Application topically as needed.     colestipol  (COLESTID ) 1 g tablet Take 2 tablets (2 g total) by mouth 2 (two) times daily. 360 tablet 2   cyanocobalamin  (VITAMIN B12) 1000 MCG tablet Take 1,000 mcg by mouth daily.     cyclobenzaprine  (FLEXERIL ) 10 MG tablet Take 1 tablet (10 mg total) by mouth at bedtime. 90 tablet 1   doxycycline  (VIBRA -TABS) 100 MG tablet Take 100 mg by mouth 2 (two) times daily.     folic acid  (FOLVITE ) 1 MG tablet Take 1 mg by mouth daily.     insulin  glargine (LANTUS  SOLOSTAR) 100 UNIT/ML Solostar Pen Inject 20 Units into the skin 2 (two)  times daily. 36 mL 2   lisinopril  (ZESTRIL ) 20 MG tablet TAKE 1 TABLET BY MOUTH EVERY DAY 90 tablet 3   loperamide  (IMODIUM ) 2 MG capsule Take 1 capsule (2 mg total) by mouth daily. 90 capsule 3   melatonin 5 MG TABS Take 5 mg by mouth at bedtime.     metFORMIN  (GLUCOPHAGE -XR) 500 MG 24 hr tablet TAKE 1 TABLET BY MOUTH TWICE A DAY 180 tablet 2   methotrexate  (RHEUMATREX) 2.5 MG tablet Take 17.5 mg by mouth every Sunday at 6pm.     metroNIDAZOLE  (METROCREAM ) 0.75 % cream SMARTSIG:sparingly Topical Twice Daily     nystatin  (MYCOSTATIN ) 100000 UNIT/ML suspension Take 5 mLs (500,000 Units total) by mouth in the morning, at noon, and at bedtime. 60 mL 0   omeprazole  (PRILOSEC) 40 MG capsule Take 1 capsule (40 mg total) by mouth daily. 180 capsule 1   ondansetron  (ZOFRAN -ODT) 4 MG disintegrating tablet Take 1 tablet (4 mg total) by mouth  every 8 (eight) hours as needed for nausea or vomiting. 20 tablet 0   oxyCODONE -acetaminophen  (PERCOCET) 10-325 MG tablet Take 1 tablet by mouth 3 (three) times daily as needed.     polyethylene glycol (MIRALAX  / GLYCOLAX ) 17 g packet Take 17 g by mouth daily as needed for moderate constipation.     potassium chloride  (KLOR-CON ) 10 MEQ tablet TAKE 3 TABLETS BY MOUTH EVERY DAY 270 tablet 2   Probiotic Product (PROBIOTIC DAILY PO) Take 1 capsule by mouth daily.     rosuvastatin  (CRESTOR ) 5 MG tablet TAKE 1 TABLET BY MOUTH EVERY DAY 90 tablet 2   sertraline  (ZOLOFT ) 100 MG tablet Take 2 tablets (200 mg total) by mouth daily. 180 tablet 1   naloxone (NARCAN) nasal spray 4 mg/0.1 mL Place 1 spray into the nose as needed. (Patient not taking: Reported on 06/23/2023)     No current facility-administered medications on file prior to visit.     Review of Systems  Constitutional:  Negative for fever.  Respiratory:  Negative for cough, shortness of breath and wheezing.   Cardiovascular:  Negative for chest pain, palpitations and leg swelling.  Neurological:  Negative for light-headedness and headaches.       Objective:   Vitals:   09/20/23 1048  BP: 120/78  Pulse: 60  Temp: 98 F (36.7 C)  SpO2: 98%   BP Readings from Last 3 Encounters:  09/20/23 120/78  06/23/23 (!) 100/56  04/30/23 132/62   Wt Readings from Last 3 Encounters:  09/20/23 203 lb (92.1 kg)  06/23/23 201 lb (91.2 kg)  04/30/23 197 lb 8 oz (89.6 kg)   Body mass index is 32.77 kg/m.    Physical Exam Constitutional:      General: She is not in acute distress.    Appearance: Normal appearance.  HENT:     Head: Normocephalic and atraumatic.  Eyes:     Conjunctiva/sclera: Conjunctivae normal.  Cardiovascular:     Rate and Rhythm: Normal rate and regular rhythm.     Heart sounds: Normal heart sounds.  Pulmonary:     Effort: Pulmonary effort is normal. No respiratory distress.     Breath sounds: Normal breath  sounds. No wheezing.  Musculoskeletal:     Cervical back: Neck supple.     Right lower leg: No edema.     Left lower leg: No edema.  Lymphadenopathy:     Cervical: No cervical adenopathy.  Skin:    General: Skin is warm and dry.  Findings: No rash.  Neurological:     Mental Status: She is alert. Mental status is at baseline.  Psychiatric:        Mood and Affect: Mood normal.        Behavior: Behavior normal.        Lab Results  Component Value Date   WBC 9.7 07/02/2023   HGB 11.2 (L) 07/02/2023   HCT 34.6 (L) 07/02/2023   PLT 209.0 07/02/2023   GLUCOSE 137 (H) 07/02/2023   CHOL 124 09/17/2022   TRIG 102.0 09/17/2022   HDL 47.00 09/17/2022   LDLDIRECT 139.0 08/15/2019   LDLCALC 57 09/17/2022   ALT 27 07/02/2023   AST 28 07/02/2023   NA 133 (L) 07/02/2023   K 4.4 07/02/2023   CL 98 07/02/2023   CREATININE 0.94 07/02/2023   BUN 21 07/02/2023   CO2 30 07/02/2023   TSH 4.60 07/02/2023   INR 1.4 (H) 12/27/2022   HGBA1C 5.8 (H) 12/22/2022   MICROALBUR <0.7 09/17/2022     Assessment & Plan:    See Problem List for Assessment and Plan of chronic medical problems.

## 2023-09-19 NOTE — Patient Instructions (Addendum)
      Blood work was ordered.       Medications changes include :   None     Return in about 6 months (around 03/19/2024) for follow up.

## 2023-09-20 ENCOUNTER — Ambulatory Visit (INDEPENDENT_AMBULATORY_CARE_PROVIDER_SITE_OTHER): Payer: Medicare Other | Admitting: Internal Medicine

## 2023-09-20 VITALS — BP 120/78 | HR 60 | Temp 98.0°F | Ht 66.0 in | Wt 203.0 lb

## 2023-09-20 DIAGNOSIS — M545 Low back pain, unspecified: Secondary | ICD-10-CM

## 2023-09-20 DIAGNOSIS — I10 Essential (primary) hypertension: Secondary | ICD-10-CM | POA: Diagnosis not present

## 2023-09-20 DIAGNOSIS — F3289 Other specified depressive episodes: Secondary | ICD-10-CM | POA: Diagnosis not present

## 2023-09-20 DIAGNOSIS — M109 Gout, unspecified: Secondary | ICD-10-CM | POA: Diagnosis not present

## 2023-09-20 DIAGNOSIS — G8929 Other chronic pain: Secondary | ICD-10-CM | POA: Diagnosis not present

## 2023-09-20 DIAGNOSIS — E1142 Type 2 diabetes mellitus with diabetic polyneuropathy: Secondary | ICD-10-CM | POA: Diagnosis not present

## 2023-09-20 DIAGNOSIS — F419 Anxiety disorder, unspecified: Secondary | ICD-10-CM

## 2023-09-20 DIAGNOSIS — K219 Gastro-esophageal reflux disease without esophagitis: Secondary | ICD-10-CM | POA: Diagnosis not present

## 2023-09-20 DIAGNOSIS — E785 Hyperlipidemia, unspecified: Secondary | ICD-10-CM | POA: Diagnosis not present

## 2023-09-20 DIAGNOSIS — L4050 Arthropathic psoriasis, unspecified: Secondary | ICD-10-CM

## 2023-09-20 DIAGNOSIS — Z7984 Long term (current) use of oral hypoglycemic drugs: Secondary | ICD-10-CM | POA: Diagnosis not present

## 2023-09-20 LAB — LIPID PANEL
Cholesterol: 125 mg/dL (ref 0–200)
HDL: 48.6 mg/dL (ref >39.00)
LDL Cholesterol: 54 mg/dL (ref 0–99)
NonHDL: 76.07
Total CHOL/HDL Ratio: 3
Triglycerides: 110 mg/dL (ref 0.0–149.0)
VLDL: 22 mg/dL (ref 0.0–40.0)

## 2023-09-20 LAB — CBC WITH DIFFERENTIAL/PLATELET
Basophils Absolute: 0 10*3/uL (ref 0.0–0.1)
Basophils Relative: 0.2 % (ref 0.0–3.0)
Eosinophils Absolute: 0.2 10*3/uL (ref 0.0–0.7)
Eosinophils Relative: 1.8 % (ref 0.0–5.0)
HCT: 33.3 % — ABNORMAL LOW (ref 36.0–46.0)
Hemoglobin: 10.8 g/dL — ABNORMAL LOW (ref 12.0–15.0)
Lymphocytes Relative: 22.6 % (ref 12.0–46.0)
Lymphs Abs: 2.2 10*3/uL (ref 0.7–4.0)
MCHC: 32.4 g/dL (ref 30.0–36.0)
MCV: 92.9 fL (ref 78.0–100.0)
Monocytes Absolute: 0.6 10*3/uL (ref 0.1–1.0)
Monocytes Relative: 6 % (ref 3.0–12.0)
Neutro Abs: 6.8 10*3/uL (ref 1.4–7.7)
Neutrophils Relative %: 69.4 % (ref 43.0–77.0)
Platelets: 223 10*3/uL (ref 150.0–400.0)
RBC: 3.59 Mil/uL — ABNORMAL LOW (ref 3.87–5.11)
RDW: 20.4 % — ABNORMAL HIGH (ref 11.5–15.5)
WBC: 9.9 10*3/uL (ref 4.0–10.5)

## 2023-09-20 LAB — COMPREHENSIVE METABOLIC PANEL
ALT: 14 U/L (ref 0–35)
AST: 21 U/L (ref 0–37)
Albumin: 3.8 g/dL (ref 3.5–5.2)
Alkaline Phosphatase: 74 U/L (ref 39–117)
BUN: 31 mg/dL — ABNORMAL HIGH (ref 6–23)
CO2: 27 meq/L (ref 19–32)
Calcium: 9.7 mg/dL (ref 8.4–10.5)
Chloride: 100 meq/L (ref 96–112)
Creatinine, Ser: 1.23 mg/dL — ABNORMAL HIGH (ref 0.40–1.20)
GFR: 42.2 mL/min — ABNORMAL LOW (ref >60.00)
Glucose, Bld: 75 mg/dL (ref 70–99)
Potassium: 4.5 meq/L (ref 3.5–5.1)
Sodium: 136 meq/L (ref 135–145)
Total Bilirubin: 0.4 mg/dL (ref 0.2–1.2)
Total Protein: 6.4 g/dL (ref 6.0–8.3)

## 2023-09-20 LAB — HEMOGLOBIN A1C: Hgb A1c MFr Bld: 6 % (ref 4.6–6.5)

## 2023-09-20 LAB — URIC ACID: Uric Acid, Serum: 5.3 mg/dL (ref 2.4–7.0)

## 2023-09-20 NOTE — Assessment & Plan Note (Signed)
Chronic Regular exercise and healthy diet encouraged Check lipid panel  Continue Crestor 5 mg daily 

## 2023-09-20 NOTE — Assessment & Plan Note (Signed)
 Chronic Follows with orthopedics and on chronic pain medication which helps some, but not much Mobility is limited Maybe consider a scooter

## 2023-09-20 NOTE — Assessment & Plan Note (Addendum)
 Chronic Not ideally controlled Discussed that we could taper her off the sertraline  and try a different medication since she has been on that for a while Discussed seeing a therapist-advised that she check with her secondary insurance to see if they cover therapy Continue sertraline  to 200 mg daily

## 2023-09-20 NOTE — Assessment & Plan Note (Signed)
Chronic Blood pressure well controlled CMP, CBC Continue carvedilol 6.25 mg twice daily, lisinopril 20 mg daily

## 2023-09-20 NOTE — Assessment & Plan Note (Addendum)
 Chronic  Lab Results  Component Value Date   HGBA1C 5.8 (H) 12/22/2022   Sugars controlled Testing sugars 1 times a day-well-controlled Check A1c, urine microalbumin Continue metformin  XR 500 mg twice daily, Lantus  20 units twice daily Stressed regular exercise, diabetic diet

## 2023-09-20 NOTE — Assessment & Plan Note (Signed)
Chronic Following with podiatry regularly

## 2023-09-20 NOTE — Assessment & Plan Note (Addendum)
 Chronic Management per rheumatology On methotrexate , Cimzia 

## 2023-09-20 NOTE — Assessment & Plan Note (Signed)
 Chronic Controlled, Stable Check uric acid level Continue allopurinol  100 mg twice daily

## 2023-09-20 NOTE — Assessment & Plan Note (Signed)
 Chronic GERD controlled Continue omeprazole 40 mg daily

## 2023-09-20 NOTE — Assessment & Plan Note (Addendum)
 Chronic Not ideally controlled Discussed that we could taper her off the sertraline  and try a different medication since she has been on that for a while Discussed seeing a therapist-advised that she check with her secondary insurance to see if they cover therapy She did not want to change medication at this time, but will think about it Continue sertraline  to 200 mg daily

## 2023-09-21 ENCOUNTER — Encounter: Payer: Self-pay | Admitting: Internal Medicine

## 2023-09-21 LAB — MICROALBUMIN / CREATININE URINE RATIO
Creatinine,U: 100.5 mg/dL
Microalb Creat Ratio: 7 mg/g (ref 0.0–30.0)
Microalb, Ur: <0.7 mg/dL (ref 0.0–1.9)

## 2023-09-23 DIAGNOSIS — M5451 Vertebrogenic low back pain: Secondary | ICD-10-CM | POA: Diagnosis not present

## 2023-09-23 DIAGNOSIS — M51369 Other intervertebral disc degeneration, lumbar region without mention of lumbar back pain or lower extremity pain: Secondary | ICD-10-CM | POA: Diagnosis not present

## 2023-09-23 DIAGNOSIS — M47816 Spondylosis without myelopathy or radiculopathy, lumbar region: Secondary | ICD-10-CM | POA: Diagnosis not present

## 2023-10-02 ENCOUNTER — Other Ambulatory Visit: Payer: Self-pay | Admitting: Internal Medicine

## 2023-10-04 ENCOUNTER — Ambulatory Visit: Payer: Medicare Other | Admitting: Internal Medicine

## 2023-10-04 ENCOUNTER — Ambulatory Visit: Payer: Medicare Other

## 2023-10-07 DIAGNOSIS — Z79899 Other long term (current) drug therapy: Secondary | ICD-10-CM | POA: Diagnosis not present

## 2023-10-07 DIAGNOSIS — L4059 Other psoriatic arthropathy: Secondary | ICD-10-CM | POA: Diagnosis not present

## 2023-10-08 ENCOUNTER — Other Ambulatory Visit: Payer: Self-pay | Admitting: Internal Medicine

## 2023-10-10 ENCOUNTER — Encounter: Payer: Self-pay | Admitting: Internal Medicine

## 2023-10-11 ENCOUNTER — Other Ambulatory Visit: Payer: Self-pay

## 2023-10-11 ENCOUNTER — Encounter: Payer: Self-pay | Admitting: Internal Medicine

## 2023-10-11 MED ORDER — ONDANSETRON 4 MG PO TBDP: 4.0000 mg | ORAL_TABLET | Freq: Three times a day (TID) | ORAL | 0 refills | Status: DC | PRN

## 2023-10-11 NOTE — Telephone Encounter (Signed)
 Pt requesting rx, states she doesn't have time to go to lab as she is going out of town

## 2023-10-12 MED ORDER — FLUCONAZOLE 150 MG PO TABS: 150.0000 mg | ORAL_TABLET | Freq: Once | ORAL | 0 refills | Status: AC

## 2023-10-21 ENCOUNTER — Ambulatory Visit: Payer: Medicare Other | Admitting: Podiatry

## 2023-10-27 ENCOUNTER — Other Ambulatory Visit: Payer: Self-pay | Admitting: Internal Medicine

## 2023-10-27 ENCOUNTER — Ambulatory Visit: Payer: Medicare Other

## 2023-10-27 ENCOUNTER — Ambulatory Visit (INDEPENDENT_AMBULATORY_CARE_PROVIDER_SITE_OTHER): Payer: Medicare Other

## 2023-10-27 VITALS — Ht 66.0 in | Wt 203.0 lb

## 2023-10-27 DIAGNOSIS — Z0000 Encounter for general adult medical examination without abnormal findings: Secondary | ICD-10-CM | POA: Diagnosis not present

## 2023-10-27 NOTE — Progress Notes (Signed)
 Subjective:   [name] [name] is a 78 y.o. who presents for a Medicare Wellness preventive visit.  Visit Complete: Virtual I connected with  [name] [name] on 10/27/23 by a audio enabled telemedicine application and verified that I am speaking with the correct person using two identifiers.  Patient Location: Home  Provider Location: Office/Clinic  I discussed the limitations of evaluation and management by telemedicine. The patient expressed understanding and agreed to proceed.  Vital Signs: Because this visit was a virtual/telehealth visit, some criteria may be missing or patient reported. Any vitals not documented were not able to be obtained and vitals that have been documented are patient reported.  VideoDeclined- This patient declined Librarian, academic. Therefore the visit was completed with audio only.  Persons Participating in Visit: Patient.  AWV Questionnaire: No: Patient Medicare AWV questionnaire was not completed prior to this visit.  Cardiac Risk Factors include: advanced age (>32men, >62 women);hypertension;dyslipidemia;diabetes mellitus;obesity (BMI >30kg/m2)     Objective:    Today's Vitals   10/27/23 1019 10/27/23 1020  Weight: 203 lb (92.1 kg)   Height: 5\' 6"  (1.676 m)   PainSc:  4   PainLoc: Back    Body mass index is 32.77 kg/m.     10/27/2023   10:17 AM 07/06/2023    2:24 PM 02/22/2023    7:48 AM 01/04/2023    7:40 PM 12/28/2022   12:08 PM 12/26/2022    6:14 PM 12/22/2022   11:43 AM  Advanced Directives  Does Patient Have a Medical Advance Directive? Yes Yes Yes Yes Yes Yes Yes  Type of Estate agent of [place];Living will Healthcare Power of [employer] of [place];Living will Living will Healthcare Power of [place];Living will Healthcare Power of [employer] Power of Attorney  Does patient want to make changes to medical advance directive? No - Patient declined     No - Patient  declined   Copy of Healthcare Power of Attorney in Chart? Yes - validated most recent copy scanned in chart (See row information)    No - copy requested  Yes - validated most recent copy scanned in chart (See row information)    Current Medications (verified) Outpatient Encounter Medications as of 10/27/2023  Medication Sig   acetaminophen (TYLENOL) 500 MG tablet Take 1 tablet (500 mg total) by mouth every 6 (six) hours as needed for mild pain or fever.   allopurinol (ZYLOPRIM) 100 MG tablet TAKE 1 TABLET BY MOUTH TWICE A DAY   ALPRAZolam (XANAX) 0.25 MG tablet Take 1 tablet (0.25 mg total) by mouth 2 (two) times daily as needed.   augmented betamethasone dipropionate (DIPROLENE-AF) 0.05 % cream Apply topically.   azelastine (ASTELIN) 0.1 % nasal spray INSTILL 2 SPRAYS IN EACH NOSTRIL TWICE DAILY   Biotin 5000 MCG CAPS Take 5,000 mcg by mouth daily.   budesonide (ENTOCORT EC) 3 MG 24 hr capsule TAKE 3 CAPSULES DAILY   carvedilol (COREG) 6.25 MG tablet Take 1 tablet (6.25 mg total) by mouth 2 (two) times daily. Take 6.25 mg by mouth 2 (two) times daily.   cholecalciferol (VITAMIN D3) 25 MCG (1000 UNIT) tablet Take 1,000 Units by mouth daily.   clobetasol cream (TEMOVATE) 0.05 % Apply 1 Application topically as needed.   colestipol (COLESTID) 1 g tablet Take 2 tablets (2 g total) by mouth 2 (two) times daily.   cyanocobalamin (VITAMIN B12) 1000 MCG tablet Take 1,000 mcg by mouth daily.   cyclobenzaprine (FLEXERIL) 10  MG tablet TAKE 1 TABLET BY MOUTH EVERYDAY AT BEDTIME   folic acid (FOLVITE) 1 MG tablet Take 1 mg by mouth daily.   insulin glargine (LANTUS SOLOSTAR) 100 UNIT/ML Solostar Pen Inject 20 Units into the skin 2 (two) times daily.   lisinopril (ZESTRIL) 20 MG tablet TAKE 1 TABLET BY MOUTH EVERY DAY   loperamide (IMODIUM) 2 MG capsule Take 1 capsule (2 mg total) by mouth daily.   melatonin 5 MG TABS Take 5 mg by mouth at bedtime.   metFORMIN (GLUCOPHAGE-XR) 500 MG 24 hr tablet TAKE 1  TABLET BY MOUTH TWICE A DAY   methotrexate (RHEUMATREX) 2.5 MG tablet Take 17.5 mg by mouth every Sunday at 6pm.   metroNIDAZOLE (METROCREAM) 0.75 % cream SMARTSIG:sparingly Topical Twice Daily   nystatin (MYCOSTATIN) 100000 UNIT/ML suspension Take 5 mLs (500,000 Units total) by mouth in the morning, at noon, and at bedtime.   omeprazole (PRILOSEC) 40 MG capsule TAKE 1 CAPSULE BY MOUTH TWICE A DAY   ondansetron (ZOFRAN-ODT) 4 MG disintegrating tablet Take 1 tablet (4 mg total) by mouth every 8 (eight) hours as needed for nausea or vomiting.   oxyCODONE-acetaminophen (PERCOCET) 10-325 MG tablet Take 1 tablet by mouth 3 (three) times daily as needed.   polyethylene glycol (MIRALAX / GLYCOLAX) 17 g packet Take 17 g by mouth daily as needed for moderate constipation.   potassium chloride (KLOR-CON) 10 MEQ tablet TAKE 3 TABLETS BY MOUTH EVERY DAY   Probiotic Product (PROBIOTIC DAILY PO) Take 1 capsule by mouth daily.   rosuvastatin (CRESTOR) 5 MG tablet TAKE 1 TABLET BY MOUTH EVERY DAY   sertraline (ZOLOFT) 100 MG tablet Take 2 tablets (200 mg total) by mouth daily.   [DISCONTINUED] doxycycline (VIBRA-TABS) 100 MG tablet Take 100 mg by mouth 2 (two) times daily.   [DISCONTINUED] naloxone (NARCAN) nasal spray 4 mg/0.1 mL Place 1 spray into the nose as needed. (Patient not taking: Reported on 06/23/2023)   No facility-administered encounter medications on file as of 10/27/2023.    Allergies (verified) Other, Thorazine [chlorpromazine], Bee venom, Farxiga [dapagliflozin], Semaglutide, and Sulfa antibiotics   History: Past Medical History:  Diagnosis Date   Anemia    Anxiety    Arthritis    Closed fracture of fifth metatarsal bone 05/13/2018   Deep vein thrombosis (HCC)    right calf - 05/2012    Depression    Diabetes mellitus without complication (HCC)    diet controlled    GERD (gastroesophageal reflux disease)    Hyperlipidemia    Patient denies.   Hypertension    Ejection fraction  =>55% Left ventricular systolic function is normal. Left ventricular wall motion is normal     Lymphocytic colitis    Neuropathy    diabetic - in bilateral feet    Pityriasis lichenoides chronica    Pneumonia    Sleep apnea    bipap   Past Surgical History:  Procedure Laterality Date   BILIARY STENT PLACEMENT  12/28/2022   Procedure: BILIARY STENT PLACEMENT;  Surgeon: Iva Boop, MD;  Location: [facility] END[facility]COPY;  Service: Gastroenterology;;   BIOPSY  11/09/2022   Procedure: BIOPSY;  Surgeon: Lemar Lofty., MD;  Location: [facility];  Service: Gastroenterology;;   CHOLECYSTECTOMY N/A 12/23/2022   Procedure: LAPAROSCOPIC CHOLECYSTECTOMY WITH ICG DYE;  Surgeon: Gaynelle Adu, MD;  Location: [facility];  Service: General;  Laterality: N/A;   DILATION AND CURETTAGE OF UTERUS     ENDOSCOPIC RETROGRADE CHOLANGIOPANCREATOGRAPHY (ERCP) WITH PROPOFOL N/A 12/28/2022  Procedure: ENDOSCOPIC RETROGRADE CHOLANGIOPANCREATOGRAPHY (ERCP) WITH PROPOFOL;  Surgeon: Iva Boop, MD;  Location: [facility] ENDOSCOPY;  Service: Gastroenterology;  Laterality: N/A;   ENDOSCOPIC RETROGRADE CHOLANGIOPANCREATOGRAPHY (ERCP) WITH PROPOFOL N/A 02/22/2023   Procedure: ENDOSCOPIC RETROGRADE CHOLANGIOPANCREATOGRAPHY (ERCP) WITH PROPOFOL;  Surgeon: Iva Boop, MD;  Location: [facility];  Service: Gastroenterology;  Laterality: N/A;   ESOPHAGOGASTRODUODENOSCOPY N/A 11/09/2022   Procedure: ESOPHAGOGASTRODUODENOSCOPY (EGD);  Surgeon: Lemar Lofty., MD;  Location: [facility];  Service: Gastroenterology;  Laterality: N/A;   EYE SURGERY  07/2020   GASTROINTESTINAL STENT REMOVAL N/A 02/22/2023   Procedure: GASTROINTESTINAL STENT REMOVAL;  Surgeon: Iva Boop, MD;  Location: [facility];  Service: Gastroenterology;  Laterality: N/A;   HAMMER TOE SURGERY     right hand surgery      due to blood infection    torn meniscus repair      right knee    TOTAL HIP ARTHROPLASTY  09/13/2012   Procedure: TOTAL HIP  ARTHROPLASTY ANTERIOR APPROACH;  Surgeon: Shelda Pal, MD;  Location: [facility];  Service: Orthopedics;  Laterality: Right;   UPPER ESOPHAGEAL ENDOSCOPIC ULTRASOUND (EUS) N/A 11/09/2022   Procedure: UPPER ESOPHAGEAL ENDOSCOPIC ULTRASOUND (EUS);  Surgeon: Lemar Lofty., MD;  Location: [facility];  Service: Gastroenterology;  Laterality: N/A;   Family History  Problem Relation Age of Onset   Hypertension Mother    Social History   Socioeconomic History   Marital status: Divorced    Spouse name: Not on file   Number of children: Not on file   Years of education: Not on file   Highest education level: Professional school degree (e.g., MD, DDS, DVM, JD)  Occupational History   Not on file  Tobacco Use   Smoking status: Never    Passive exposure: Never   Smokeless tobacco: Never  Vaping Use   Vaping status: Not on file  Substance and Sexual Activity   Alcohol use: Yes    Alcohol/week: 1.0 standard drink of alcohol    Types: 1 Glasses of wine per week    Comment: occasional wine    Drug use: No   Sexual activity: Not on file  Other Topics Concern   Not on file  Social History Narrative   She is widowed. She has 2 children, 2 grandchildren. She does drink occasional alcohol . There is no tobacco . She does not have a routine exercise program   Social Drivers of Health   Financial Resource Strain: Low Risk  (10/27/2023)   Overall Financial Resource Strain (CARDIA)    Difficulty of Paying Living Expenses: Not hard at all  Food Insecurity: No Food Insecurity (10/27/2023)   Hunger Vital Sign    Worried About Running Out of Food in the Last Year: Never true    Ran Out of Food in the Last Year: Never true  Transportation Needs: No Transportation Needs (10/27/2023)   PRAPARE - Administrator, Civil Service (Medical): No    Lack of Transportation (Non-Medical): No  Physical Activity: Insufficiently Active (10/27/2023)   Exercise Vital Sign    Days of Exercise per  Week: 3 days    Minutes of Exercise per Session: 20 min  Stress: No Stress Concern Present (10/27/2023)   [employer] of Occupational Health - Occupational Stress Questionnaire    Feeling of Stress : Only a little  Recent Concern: Stress - Stress Concern Present (09/17/2023)   [employer] of Occupational Health - Occupational Stress Questionnaire    Feeling of Stress :  Very much  Social Connections: Moderately Integrated (10/27/2023)   Social Connection and Isolation Panel [NHANES]    Frequency of Communication with Friends and Family: More than three times a week    Frequency of Social Gatherings with Friends and Family: More than three times a week    Attends Religious Services: More than 4 times per year    Active Member of [employer] or Organizations: Yes    Attends Banker Meetings: 1 to 4 times per year    Marital Status: Divorced    Tobacco Counseling Counseling given: No    Clinical Intake:  Pre-visit preparation completed: Yes  Pain : 0-10 Pain Score: 4  Pain Type: Chronic pain Pain Location: Back Pain Orientation: Lower Pain Descriptors / Indicators: Sharp Pain Onset: 1 to 4 weeks ago Pain Relieving Factors: pain med Effect of Pain on Daily Activities: done light stretching  Pain Relieving Factors: pain med  BMI - recorded: 32.77 Nutritional Status: BMI > 30  Obese Nutritional Risks: None Diabetes: Yes CBG done?: No Did pt. bring in CBG monitor from home?: No  Lab Results  Component Value Date   HGBA1C 6.0 09/20/2023   HGBA1C 5.8 (H) 12/22/2022   HGBA1C 6.3 09/17/2022     How often do you need to have someone help you when you read instructions, pamphlets, or other written materials from your doctor or pharmacy?: 1 - Never  Interpreter Needed?: No  Information entered by :: Hassell Halim, CMA   Activities of Daily Living     10/27/2023   10:23 AM 12/26/2022    6:14 PM  In your present state of health, do you have any difficulty  performing the following activities:  Hearing? 0 0  Vision? 0 0  Difficulty concentrating or making decisions? 0 0  Walking or climbing stairs? 1 0  Comment back pain   Dressing or bathing? 0 0  Doing errands, shopping? 0 1  Preparing Food and eating ? N   Using the Toilet? N   In the past six months, have you accidently leaked urine? Y   Comment wears a pad   Do you have problems with loss of bowel control? N   Managing your Medications? N   Managing your Finances? N   Housekeeping or managing your Housekeeping? N     Patient Care Team: Pincus Sanes, MD as PCP - General (Internal Medicine) Lennette Bihari, MD as PCP - Cardiology (Cardiology) Sinda Du, MD as Consulting Physician (Ophthalmology) Zenovia Jordan, MD as Consulting Physician (Rheumatology) Helane Gunther, DPM as Consulting Physician (Podiatry)  Indicate any recent Medical Services you may have received from other than [facility] providers in the past year (date may be approximate).     Assessment:   This is a routine wellness examination for [place].  Hearing/Vision screen Hearing Screening - Comments:: Denies hearing difficulties   Vision Screening - Comments:: Wears rx glasses - up to date with routine eye exams with Dr Cathey Endow   Goals Addressed               This Visit's Progress     Patient Stated (pt-stated)        Patient stated that she plans to exercise more often.       Depression Screen     10/27/2023   10:29 AM 09/20/2023   10:58 AM 10/01/2022    1:44 PM 09/17/2022    9:51 AM 07/28/2022    1:47 PM 07/15/2022    9:34  AM 03/12/2022    9:38 AM  PHQ 2/9 Scores  PHQ - 2 Score 0 0 0 0 0 0 0  PHQ- 9 Score 4  6 6 3 3      Fall Risk     10/27/2023   10:24 AM 09/20/2023   10:58 AM 10/01/2022    1:40 PM 09/17/2022    9:51 AM 07/28/2022    1:47 PM  Fall Risk   Falls in the past year? 0 0 0 0 0  Number falls in past yr: 0 0 0 0 0  Injury with Fall? 0 0 0 0 0  Risk for fall due to : No Fall Risks  No Fall Risks No Fall Risks No Fall Risks No Fall Risks  Follow up Falls prevention discussed;Falls evaluation completed Falls evaluation completed Falls prevention discussed Falls evaluation completed Falls evaluation completed    MEDICARE RISK AT HOME:  Medicare Risk at Home Any stairs in or around the home?: No If so, are there any without handrails?: No Home free of loose throw rugs in walkways, pet beds, electrical cords, etc?: Yes Adequate lighting in your home to reduce risk of falls?: Yes Life alert?: No Use of a cane, walker or w/c?: Yes (cane/walker) Grab bars in the bathroom?: Yes Shower chair or bench in shower?: Yes Elevated toilet seat or a handicapped toilet?: Yes  TIMED UP AND GO:  Was the test performed?  No  Cognitive Function: 6CIT completed        10/27/2023   10:27 AM 10/01/2022    1:43 PM 09/16/2021    9:46 AM  6CIT Screen  What Year? 0 points 0 points 0 points  What month? 0 points 0 points 0 points  What time? 0 points 0 points 0 points  Count back from 20 0 points 0 points 0 points  Months in reverse 0 points 0 points 0 points  Repeat phrase 2 points 0 points 0 points  Total Score 2 points 0 points 0 points    Immunizations Immunization History  Administered Date(s) Administered   Fluad Quad(high Dose 65+) 05/20/2020, 05/26/2021, 05/05/2023   Fluad Trivalent(High Dose 65+) 05/05/2023   Influenza Split 04/11/2018   Influenza, High Dose Seasonal PF 04/27/2016, 04/29/2017, 05/09/2019   Influenza,inj,Quad PF,6+ Mos 05/18/2014, 05/11/2019   Influenza,inj,quad, With Preservative 04/12/2017   Influenza-Unspecified 05/10/2014, 05/23/2022   Moderna Covid-19 Fall Seasonal Vaccine 50yrs & older 02/10/2023   Moderna Covid-19 Vaccine Bivalent Booster 29yrs & up 04/22/2021, 12/01/2021, 05/10/2022   Moderna Sars-Covid-2 Vaccination 09/09/2019, 10/07/2019, 04/09/2020, 09/19/2020   Pfizer(Comirnaty)Fall Seasonal Vaccine 12 years and older 06/05/2023    Pneumococcal Conjugate-13 10/02/2014   Pneumococcal Polysaccharide-23 11/20/2010, 07/28/2018   Respiratory Syncytial Virus Vaccine,Recomb Aduvanted(Arexvy) 06/20/2022   Rsv, Mab, Judi Cong, 0.5 Ml, Neonate To 24 Mos(Beyfortus) 06/20/2022   Td 08/10/2008, 09/13/2018   Unspecified SARS-COV-2 Vaccination 10/10/2019, 09/19/2020   Zoster Recombinant(Shingrix) 03/12/2017, 02/17/2018    Screening Tests Health Maintenance  Topic Date Due   Hepatitis C Screening  11/18/2068 (Originally 07/27/1964)   COVID-19 Vaccine (12 - Mixed Product risk 2024-25 season) 12/04/2023   HEMOGLOBIN A1C  03/19/2024   FOOT EXAM  04/15/2024   OPHTHALMOLOGY EXAM  06/02/2024   Diabetic kidney evaluation - eGFR measurement  09/19/2024   Diabetic kidney evaluation - Urine ACR  09/19/2024   Medicare Annual Wellness (AWV)  10/26/2024   DEXA SCAN  05/05/2025   DTaP/Tdap/Td (3 - Tdap) 09/13/2028   Pneumonia Vaccine 36+ Years old  Completed   INFLUENZA  VACCINE  Completed   Zoster Vaccines- Shingrix  Completed   HPV VACCINES  Aged Out   Colonoscopy  Discontinued    Health Maintenance  There are no preventive care reminders to display for this patient. Health Maintenance Items Addressed: 10/27/2023   Additional Screening:  Vision Screening: Recommended annual ophthalmology exams for early detection of glaucoma and other disorders of the eye.  Dental Screening: Recommended annual dental exams for proper oral hygiene  Community Resource Referral / Chronic Care Management: CRR required this visit?  No   CCM required this visit?  No     Plan:     I have personally reviewed and noted the following in the patient's chart:   Medical and social history Use of alcohol, tobacco or illicit drugs  Current medications and supplements including opioid prescriptions. Patient is currently taking opioid prescriptions. Information provided to patient regarding non-opioid alternatives. Patient advised to discuss  non-opioid treatment plan with their provider. Functional ability and status Nutritional status Physical activity Advanced directives List of other physicians Hospitalizations, surgeries, and ER visits in previous 12 months Vitals Screenings to include cognitive, depression, and falls Referrals and appointments  In addition, I have reviewed and discussed with patient certain preventive protocols, quality metrics, and best practice recommendations. A written personalized care plan for preventive services as well as general preventive health recommendations were provided to patient.     Darreld Mclean, CMA   10/27/2023   After Visit Summary: (MyChart) Due to this being a telephonic visit, the after visit summary with patients personalized plan was offered to patient via MyChart   Notes: Nothing significant to report at this time.

## 2023-10-27 NOTE — Patient Instructions (Addendum)
 [name] , Thank you for taking time to come for your Medicare Wellness Visit. I appreciate your ongoing commitment to your health goals. Please review the following plan we discussed and let me know if I can assist you in the future.   Referrals/Orders/Follow-Ups/Clinician Recommendations: Aim for 30 minutes of exercise or brisk walking, 6-8 glasses of water, and 5 servings of fruits and vegetables each day.   This is a list of the screening recommended for you and due dates:  Health Maintenance  Topic Date Due   Hepatitis C Screening  11/18/2068*   COVID-19 Vaccine (12 - Mixed Product risk 2024-25 season) 12/04/2023   Hemoglobin A1C  03/19/2024   Complete foot exam   04/15/2024   Eye exam for diabetics  06/02/2024   Yearly kidney function blood test for diabetes  09/19/2024   Yearly kidney health urinalysis for diabetes  09/19/2024   Medicare Annual Wellness Visit  10/26/2024   DEXA scan (bone density measurement)  05/05/2025   DTaP/Tdap/Td vaccine (3 - Tdap) 09/13/2028   Pneumonia Vaccine  Completed   Flu Shot  Completed   Zoster (Shingles) Vaccine  Completed   HPV Vaccine  Aged Out   Colon Cancer Screening  Discontinued  *Topic was postponed. The date shown is not the original due date.    Advanced directives: (In Chart) A copy of your advanced directives are scanned into your chart should your provider ever need it.  Next Medicare Annual Wellness Visit scheduled for next year: Yes   Managing Pain Without Opioids Opioids are strong medicines used to treat moderate to severe pain. For some people, especially those who have long-term (chronic) pain, opioids may not be the best choice for pain management due to: Side effects like nausea, constipation, and sleepiness. The risk of addiction (opioid use disorder). The longer you take opioids, the greater your risk of addiction. Pain that lasts for more than 3 months is called chronic pain. Managing chronic pain usually requires more  than one approach and is often provided by a team of health care providers working together (multidisciplinary approach). Pain management may be done at a pain management center or pain clinic. How to manage pain without the use of opioids Use non-opioid medicines Non-opioid medicines for pain may include: Over-the-counter or prescription non-steroidal anti-inflammatory drugs (NSAIDs). These may be the first medicines used for pain. They work well for muscle and bone pain, and they reduce swelling. Acetaminophen. This over-the-counter medicine may work well for milder pain but not swelling. Antidepressants. These may be used to treat chronic pain. A certain type of antidepressant (tricyclics) is often used. These medicines are given in lower doses for pain than when used for depression. Anticonvulsants. These are usually used to treat seizures but may also reduce nerve (neuropathic) pain. Muscle relaxants. These relieve pain caused by sudden muscle tightening (spasms). You may also use a pain medicine that is applied to the skin as a patch, cream, or gel (topical analgesic), such as a numbing medicine. These may cause fewer side effects than medicines taken by mouth. Do certain therapies as directed Some therapies can help with pain management. They include: Physical therapy. You will do exercises to gain strength and flexibility. A physical therapist may teach you exercises to move and stretch parts of your body that are weak, stiff, or painful. You can learn these exercises at physical therapy visits and practice them at home. Physical therapy may also involve: Massage. Heat wraps or applying heat or cold  to affected areas. Electrical signals that interrupt pain signals (transcutaneous electrical nerve stimulation, TENS). Weak lasers that reduce pain and swelling (low-level laser therapy). Signals from your body that help you learn to regulate pain (biofeedback). Occupational therapy. This helps  you to learn ways to function at home and work with less pain. Recreational therapy. This involves trying new activities or hobbies, such as a physical activity or drawing. Mental health therapy, including: Cognitive behavioral therapy (CBT). This helps you learn coping skills for dealing with pain. Acceptance and commitment therapy (ACT) to change the way you think and react to pain. Relaxation therapies, including muscle relaxation exercises and mindfulness-based stress reduction. Pain management counseling. This may be individual, family, or group counseling.  Receive medical treatments Medical treatments for pain management include: Nerve block injections. These may include a pain blocker and anti-inflammatory medicines. You may have injections: Near the spine to relieve chronic back or neck pain. Into joints to relieve back or joint pain. Into nerve areas that supply a painful area to relieve body pain. Into muscles (trigger point injections) to relieve some painful muscle conditions. A medical device placed near your spine to help block pain signals and relieve nerve pain or chronic back pain (spinal cord stimulation device). Acupuncture. Follow these instructions at home Medicines Take over-the-counter and prescription medicines only as told by your health care provider. If you are taking pain medicine, ask your health care providers about possible side effects to watch out for. Do not drive or use heavy machinery while taking prescription opioid pain medicine. Lifestyle  Do not use drugs or alcohol to reduce pain. If you drink alcohol, limit how much you have to: 0-1 drink a day for women who are not pregnant. 0-2 drinks a day for men. Know how much alcohol is in a drink. In the [place] one drink equals one 12 oz bottle of beer (355 mL), one 5 oz glass of wine (148 mL), or one 1 oz glass of hard liquor (44 mL). Do not use any products that contain nicotine or tobacco. These  products include cigarettes, chewing tobacco, and vaping devices, such as e-cigarettes. If you need help quitting, ask your health care provider. Eat a healthy diet and maintain a healthy weight. Poor diet and excess weight may make pain worse. Eat foods that are high in fiber. These include fresh fruits and vegetables, whole grains, and beans. Limit foods that are high in fat and processed sugars, such as fried and sweet foods. Exercise regularly. Exercise lowers stress and may help relieve pain. Ask your health care provider what activities and exercises are safe for you. If your health care provider approves, join an exercise class that combines movement and stress reduction. Examples include yoga and tai chi. Get enough sleep. Lack of sleep may make pain worse. Lower stress as much as possible. Practice stress reduction techniques as told by your therapist. General instructions Work with all your pain management providers to find the treatments that work best for you. You are an important member of your pain management team. There are many things you can do to reduce pain on your own. Consider joining an online or in-person support group for people who have chronic pain. Keep all follow-up visits. This is important. Where to find more information You can find more information about managing pain without opioids from: American Academy of Pain Medicine: painmed.org Institute for Chronic Pain: instituteforchronicpain.org American Chronic Pain Association: theacpa.org Contact a health care provider if: You have  side effects from pain medicine. Your pain gets worse or does not get better with treatments or home therapy. You are struggling with anxiety or depression. Summary Many types of pain can be managed without opioids. Chronic pain may respond better to pain management without opioids. Pain is best managed when you and a team of health care providers work together. Pain management  without opioids may include non-opioid medicines, medical treatments, physical therapy, mental health therapy, and lifestyle changes. Tell your health care providers if your pain gets worse or is not being managed well enough. This information is not intended to replace advice given to you by your health care provider. Make sure you discuss any questions you have with your health care provider. Document Revised: 11/06/2020 Document Reviewed: 11/06/2020 [employer] Patient Education  2024 [employer].

## 2023-11-03 ENCOUNTER — Ambulatory Visit: Admitting: Podiatry

## 2023-11-05 ENCOUNTER — Other Ambulatory Visit

## 2023-11-05 ENCOUNTER — Ambulatory Visit: Payer: Medicare Other | Admitting: Internal Medicine

## 2023-11-05 ENCOUNTER — Encounter: Payer: Self-pay | Admitting: Internal Medicine

## 2023-11-05 ENCOUNTER — Ambulatory Visit
Admission: RE | Admit: 2023-11-05 | Discharge: 2023-11-05 | Disposition: A | Discharge status: Home / Self Care | Source: Ambulatory Visit | Attending: Internal Medicine | Admitting: Internal Medicine

## 2023-11-05 VITALS — BP 112/68 | HR 57 | Ht 66.0 in | Wt 203.0 lb

## 2023-11-05 DIAGNOSIS — R159 Full incontinence of feces: Secondary | ICD-10-CM

## 2023-11-05 DIAGNOSIS — R110 Nausea: Secondary | ICD-10-CM | POA: Diagnosis not present

## 2023-11-05 DIAGNOSIS — K52839 Microscopic colitis, unspecified: Secondary | ICD-10-CM

## 2023-11-05 DIAGNOSIS — K219 Gastro-esophageal reflux disease without esophagitis: Secondary | ICD-10-CM

## 2023-11-05 DIAGNOSIS — K5900 Constipation, unspecified: Secondary | ICD-10-CM | POA: Diagnosis not present

## 2023-11-05 DIAGNOSIS — Z9049 Acquired absence of other specified parts of digestive tract: Secondary | ICD-10-CM | POA: Diagnosis not present

## 2023-11-05 DIAGNOSIS — K449 Diaphragmatic hernia without obstruction or gangrene: Secondary | ICD-10-CM | POA: Diagnosis not present

## 2023-11-05 DIAGNOSIS — R197 Diarrhea, unspecified: Secondary | ICD-10-CM | POA: Diagnosis not present

## 2023-11-05 DIAGNOSIS — Z8719 Personal history of other diseases of the digestive system: Secondary | ICD-10-CM | POA: Diagnosis not present

## 2023-11-05 DIAGNOSIS — R109 Unspecified abdominal pain: Secondary | ICD-10-CM | POA: Diagnosis not present

## 2023-11-05 MED ORDER — COLESTIPOL HCL 1 G PO TABS: 2.0000 g | ORAL_TABLET | Freq: Two times a day (BID) | ORAL | 2 refills | Status: AC

## 2023-11-05 MED ORDER — OMEPRAZOLE 40 MG PO CPDR: 40.0000 mg | DELAYED_RELEASE_CAPSULE | Freq: Two times a day (BID) | ORAL | 1 refills | Status: DC

## 2023-11-05 NOTE — Progress Notes (Signed)
 Chief Complaint: Bile leak s/p cholecystectomy, microscopic colitis  HPI : 78 year old female with history of microscopic colitis, gallstones s/p cholecystectomy c/b bile leak, IBS, DVT, DM, hiatal hernia, GERD, HTN, HFpEF, OSA, and psoriatic arthritis on Cimzia presents for follow up for fecal incontinence  Interval History: She is frustrated after getting her gallbladder surgery because she expected to feel like she was back to normal. She doesn't know if she is going to have diarrhea, nausea, or fecal incontinence. Sometimes she has some abdominal pain, though the cholecystectomy did help some with her abdominal pain overall.  She has occasional constipation.  She has a lot of fecal urgency. Endorses a lot of back pain issues, which she thinks contributes to her bowel issues. She did two sessions with pelvic floor PT, but she found it very invasive so she stopped any further sessions. She has not found pelvic floor PT very helpful yet. She is still taking her colestipol and budesonide every day. She is not taking the Imodium very often. She is probably taking it the Imodium once every 2 weeks currently. She took some Metamucil at night and then the next morning she had a blow out diarrhea. She is still using Zofran PRN and omeprazole twice daily. The omeprazole does seem to be helping with her symptoms. She lives at [facility] now with her husband. Her husband's dementia has been weighing on her. She does feel like her fecal incontinence has limited her ability to do aqua therapy, which she was previously doing twice weekly  Wt Readings from Last 3 Encounters:  11/05/23 203 lb (92.1 kg)  10/27/23 203 lb (92.1 kg)  09/20/23 203 lb (92.1 kg)    Current Outpatient Medications  Medication Sig Dispense Refill   acetaminophen (TYLENOL) 500 MG tablet Take 1 tablet (500 mg total) by mouth every 6 (six) hours as needed for mild pain or fever.     allopurinol (ZYLOPRIM) 100 MG tablet TAKE 1 TABLET BY  MOUTH TWICE A DAY 180 tablet 2   ALPRAZolam (XANAX) 0.25 MG tablet Take 1 tablet (0.25 mg total) by mouth 2 (two) times daily as needed. 60 tablet 5   augmented betamethasone dipropionate (DIPROLENE-AF) 0.05 % cream Apply topically.     azelastine (ASTELIN) 0.1 % nasal spray INSTILL 2 SPRAYS IN EACH NOSTRIL TWICE DAILY 30 mL 12   Biotin 5000 MCG CAPS Take 5,000 mcg by mouth daily.     budesonide (ENTOCORT EC) 3 MG 24 hr capsule TAKE 3 CAPSULES DAILY 180 capsule 5   carvedilol (COREG) 6.25 MG tablet Take 1 tablet (6.25 mg total) by mouth 2 (two) times daily. Take 6.25 mg by mouth 2 (two) times daily. 180 tablet 2   cholecalciferol (VITAMIN D3) 25 MCG (1000 UNIT) tablet Take 1,000 Units by mouth daily.     clobetasol cream (TEMOVATE) 0.05 % Apply 1 Application topically as needed.     colestipol (COLESTID) 1 g tablet Take 2 tablets (2 g total) by mouth 2 (two) times daily. 360 tablet 2   cyanocobalamin (VITAMIN B12) 1000 MCG tablet Take 1,000 mcg by mouth daily.     cyclobenzaprine (FLEXERIL) 10 MG tablet TAKE 1 TABLET BY MOUTH EVERYDAY AT BEDTIME 90 tablet 1   folic acid (FOLVITE) 1 MG tablet Take 1 mg by mouth daily.     insulin glargine (LANTUS SOLOSTAR) 100 UNIT/ML Solostar Pen Inject 20 Units into the skin 2 (two) times daily. 36 mL 2   lisinopril (ZESTRIL) 20 MG  tablet TAKE 1 TABLET BY MOUTH EVERY DAY 90 tablet 3   loperamide (IMODIUM) 2 MG capsule Take 1 capsule (2 mg total) by mouth daily. 90 capsule 3   melatonin 5 MG TABS Take 5 mg by mouth at bedtime.     metFORMIN (GLUCOPHAGE-XR) 500 MG 24 hr tablet TAKE 1 TABLET BY MOUTH TWICE A DAY 180 tablet 2   methotrexate (RHEUMATREX) 2.5 MG tablet Take 17.5 mg by mouth every Sunday at 6pm.     metroNIDAZOLE (METROCREAM) 0.75 % cream SMARTSIG:sparingly Topical Twice Daily     omeprazole (PRILOSEC) 40 MG capsule TAKE 1 CAPSULE BY MOUTH TWICE A DAY 180 capsule 1   ondansetron (ZOFRAN-ODT) 4 MG disintegrating tablet Take 1 tablet (4 mg total) by  mouth every 8 (eight) hours as needed for nausea or vomiting. 20 tablet 0   oxyCODONE-acetaminophen (PERCOCET) 10-325 MG tablet Take 1 tablet by mouth 3 (three) times daily as needed.     polyethylene glycol (MIRALAX / GLYCOLAX) 17 g packet Take 17 g by mouth daily as needed for moderate constipation.     potassium chloride (KLOR-CON) 10 MEQ tablet TAKE 3 TABLETS BY MOUTH EVERY DAY 270 tablet 2   Probiotic Product (PROBIOTIC DAILY PO) Take 1 capsule by mouth daily.     rosuvastatin (CRESTOR) 5 MG tablet TAKE 1 TABLET BY MOUTH EVERY DAY 90 tablet 2   sertraline (ZOLOFT) 100 MG tablet Take 2 tablets (200 mg total) by mouth daily. 180 tablet 1   nystatin (MYCOSTATIN) 100000 UNIT/ML suspension Take 5 mLs (500,000 Units total) by mouth in the morning, at noon, and at bedtime. (Patient not taking: Reported on 11/05/2023) 60 mL 0   No current facility-administered medications for this visit.   Physical Exam: BP 112/68   Pulse (!) 57   Ht 5\' 6"  (1.676 m)   Wt 203 lb (92.1 kg)   BMI 32.77 kg/m  Constitutional: Pleasant,well-developed, female in no acute distress. HEENT: Normocephalic and atraumatic. Conjunctivae are normal. No scleral icterus. Cardiovascular: Normal rate, regular rhythm.  Pulmonary/chest: Effort normal and breath sounds normal. No wheezing, rales or rhonchi. Abdominal: Soft, non-distended, nontender Extremities: 1+ edema in RLE (R>L, patient states that this is chronically an issue for her, she has broken this ankle twice). She also has a wound over her right foot with some surrounding erythema. Neurological: Alert and oriented to person place and time. Psychiatric: Normal mood and affect. Behavior is normal.  Labs 11/2020: CMP with low K of 3.3, elevated glucose of 220, ALT mildly elevated at 45, albumin low at 3.2. HbA1C of 8.5%  Labs 05/2021: CBC with elevated WBC of 11.9. CRP mildly elevated at 1.2 (decreased from 1.6). ESR mildly elevated at 35.  Labs 10/2022: CBC showed a  mildly elevated WBC of 12.2.  Lipase and LFTs were normal.   Labs 12/2022: CMP with low albumin of 2.7. Lipase nml. CBC with elevated WBC of 11.5 and low Hb of 10.1. Lipase nml.   Labs 09/2023: CBC with stable hemoglobin of 10.8.  CMP with elevated creatinine of 1.23  CT A/P w/contrast 10/02/18: 1.  Descending and sigmoid colon segmental mural thickening and  pericolonic stranding indicating colitis.  Distribution of thickening  can suggest ischemic colitis although inflammatory colitis is not  excluded.  There are mild associated diverticula but no focal inflamed  diverticulum.  2.  Pancreas atrophy.  Multifocal hyperenhancing nodular density at  the tail of pancreas, 12 mm.  Small developing mass cannot be  excluded.  Recommend follow-up MRI pancreas in 6 months.  This finding  was not described in the initial [facility] preliminary report.  Ab U/S 10/03/18: Mild fatty liver. Cholelithiasis without findings of acute  cholecystitis. Extra hepatic bile ducts and pancreatic tail are  obscured.  CT A/P w/contrast 11/02/18: IMPRESSION: 1. Heterogeneous fatty replacement throughout the pancreas, without pancreatic mass or acute process. 2.  Possible constipation. 3. Hepatic steatosis and hepatomegaly. 4.  Tiny hiatal hernia. 5.  Aortic Atherosclerosis (ICD10-I70.0). 6. Degraded evaluation of the pelvis, secondary to beam hardening artifact from right hip arthroplasty. 7. Uterine fibroid.  CT A/P w/o contrast 07/12/20: IMPRESSION: 1. Diverticulosis without diverticulitis. 2. No acute intra-abdominal or intrapelvic process. 3.  Aortic Atherosclerosis (ICD10-I70.0).  Barium swallow 09/27/20: IMPRESSION: 1. Proximal anterior esophageal web, nonobstructive to passage of a 13 mm barium tablet. 2. Otherwise unremarkable double contrast barium esophagram.  CT A/P w/contrast 10/21/22: IMPRESSION: 1. No definite acute CT findings of the abdomen or pelvis to explain abdominal discomfort or  vomiting. No evidence of bowel obstruction. 2. 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 3. Diffusely atrophic appearance of the pancreas. No pancreatic ductal dilatation or surrounding inflammatory changes. Aortic Atherosclerosis (ICD10-I70.0).  Gastric emptying study 12/01/22: IMPRESSION: No scintigraphic evidence of delayed gastric emptying.  MR Liver w/contrast 12/26/22: IMPRESSION: 1. Study is positive for postoperative bile leak with bilomas noted in the gallbladder fossa and overlying the central aspect of the liver, as detailed above. 2. Trace volume of perihepatic ascites. 3. Morphologic changes in the liver suggestive of cirrhosis. 4. Small right pleural effusion lying dependently. Passive areas of subsegmental atelectasis in the lower lobes of the lungs bilaterally.  CT drain placement 12/27/22: IMPRESSION: 1. Successful CT guided placement of a 8 [place] all purpose drain catheter into the gallbladder fossa with aspiration of 30 mL of bilious fluid. Samples were sent to the laboratory as requested by the ordering clinical team. 2. Successful CT-guided placement of a 10 French all-purpose drain catheter into the perihepatic fluid collection with aspiration of approximately 1 L of bilious fluid.  CT A/P w/contrast 01/04/23: IMPRESSION: 1. Post cholecystectomy. Biliary drain has been placed. Percutaneous draining catheters are seen in the gallbladder fossa and along the liver capsule. Diminished perihepatic ascites. 2. Small right pleural effusion. 3. Ground-glass opacities within the lingula and left lower lobe may represent atelectasis or pneumonia. 4. Aortic atherosclerosis.  Fistulogram 01/06/23: IMPRESSION: No definite fistula to the biliary tree. Contrast accumulates predominately in the perihepatic space and in the gallbladder fossa at  both drain sites. Gallbladder fossa drain will be removed today. Right perihepatic drain were remain to gravity drainage for 2 more weeks with no flushes.  KUB 05/06/23: IMPRESSION: Constipation.  EGD 09/17/21: Findings: - Multiple mild (non-circumferential scarring) stenoses were found in the entire esophagus. The stenoses were traversed. A guidewire was placed and the scope was withdrawn. Dilation was performed with a Savary dilator with no resistance at 15 mm and mild resistance at 16 mm. The dilation site was examined and showed mild mucosal disruption in the upper esophagus. - A small hiatal hernia was present. - Localized mild inflammation characterized by congestion (edema), erythema and granularity was found in the gastric body and in the gastric antrum. Biopsies were taken with a cold forceps for histology. - Multiple sessile polyps with no bleeding and no stigmata of recent bleeding were found in the gastric body. Biopsies were taken with a  cold forceps for histology. - The examined duodenum was normal. Impression: - Mild esophageal stenoses. Dilated. - Small hiatal hernia. - Gastritis. Biopsied. - Multiple gastric polyps. Biopsied. - Normal examined duodenum. Path: 1. Surgical [P], gastric - ANTRAL AND OXYNTIC MUCOSA WITH SLIGHT CHRONIC INFLAMMATION. - NO HELICOBACTER PYLORI IDENTIFIED. 2. Surgical [P], gastric polyps - FUNDIC GLAND POLYPS.  Colonoscopy 09/17/21: - One 12 mm polyp in the cecum, removed with a hot snare. Resected and retrieved. - Seven 3 to 8 mm polyps in the transverse colon and in the ascending colon, removed with a cold snare. Resected and retrieved. - Diverticulosis in the sigmoid colon and in the descending colon. - One 7 mm polyp in the rectum, removed with a cold snare. Resected and retrieved. - Non-bleeding internal hemorrhoids. Path: 3. Surgical [P], colon, cecum, polyp (1) - TUBULAR ADENOMA WITHOUT HIGH GRADE DYSPLASIA. 4. Surgical [P], colon,  ascending, transverse, polyp (7) - TUBULAR ADENOMA (7) WITHOUT HIGH GRADE DYSPLASIA. 5. Surgical [P], colon, random sites - UNREMARKABLE COLONIC MUCOSA. - NO MICROSCOPIC COLITIS, ACTIVE INFLAMMATION OR CHRONIC CHANGES. 6. Surgical [P], colon, rectum, polyp (1) - TUBULOVILLOUS ADENOMA WITHOUT HIGH GRADE DYPLASIA.  EUS 11/09/22:  Path: A. DUODENUM, BIOPSY:  - Duodenal mucosa without diagnostic abnormality  - Negative for celiac change  B. STOMACH, ANTRUM, BIOPSY:  - Antral mucosa with chronic, focally active gastritis  - Immunohistochemical stain is negative for Helicobacter organisms  - Negative for malignancy  - See comment  C. STOMACH, BODY, BIOPSY:  - Oxyntic mucosa with mild chronic gastritis and proton pump inhibitor  type effect/early fundic gland polyp like changes  - Negative for Helicobacter organisms on HE stain  - Negative for malignancy  - See comment  COMMENT:  Immunohistochemical staining for CK AE1/AE3 is performed on both blocks  B and C.  The stains are negative which is consistent with the above  diagnosis.   ERCP 12/28/22:   ERCP 02/22/23: - One stent from the biliary tree was seen in the major papilla. Normal post- cholecystectomy biliary tree - no leak or stones. Stent was removed.   ASSESSMENT AND PLAN: Nausea Fecal incontinence Diarrhea, occasional constipation S/p cholecystectomy Microscopic colitis IBS GERD Hiatal hernia History of esophageal stenosis Atrophic appearance of pancreas on CT in 10/2022 Patient overall is still struggling from irregular bowel habits.  Her diarrhea and fecal incontinence are particularly bothersome to her.  Her last KUB showed that she had significant stool burden so I favored that her symptoms were mostly due to overflow incontinence and overflow diarrhea due to underlying constipation.  However patient was not able to tolerate taking MiraLAX daily due to too many BMs.  She recently did try a fiber supplement at  nighttime, but this subsequently caused a blowout so she did not take any further doses of fiber.  Since the fiber does seem to be effective for inducing substantial BMs, I asked that she retry the fiber supplement and attempt to take this in the morning.  Patient states that she has been limited in her ability to do aqua therapy as a result of fecal incontinence.  Thus I suggested that she hold her fiber supplement the day of her aqua therapy and ensure that she takes it the day before her aqua therapy to try to clean herself out.  I also asked her to potentially look into adult swim diapers since this may allow her to be able to do aqua therapy and thus increase her physical activity despite her fecal  incontinence.  Patient has significantly cut back on her use of Imodium so I wonder if this may help with her underlying stool burden.  Will check another KUB to see if she still has substantial constipation issues.  Patient has been noted to have some pancreatic atrophy on a prior CT scan done in 10/2022.  Thus we will check a fecal elastase to ensure that she does not have any pancreatic insufficiency as a contributor to her symptoms.  Patient does state that her budesonide and colestipol are still helping with her bowel habits overall.  She was not able to tolerate pelvic floor physical therapy due to patient discomfort, but I asked her to continue her pelvic floor PT exercises if possible.  Patient's GERD appears to be reasonably well-controlled on omeprazole therapy.  She is still having some nausea issues, but this could still be related to underlying constipation.  Patient also noted to me today that she has a wound on her right foot for which she has been putting on antibiotic ointment.  I asked that she see her PCP or her podiatrist about this issue. - Check fecal elastase - Previously encouraged high protein, high fiber, and low fat diet - Continue budesonide 9 mg QD - Continue Zofran PRN - Cont  omeprazole 40 mg BID. Refill.  - Cont colestipol 2 g every day. Refill - Start daily fiber supplement in the morning. Will aim to try day before Aqua therapy and then hold on the day of Aqua therapy - Continue pelvic floor exercises - Check KUB while she is off of Imodium - Please see Dr. Lawerance Bach or podiatrist about wound on her right foot - RTC in 6 months  Eulah Pont, MD  I spent 45 minutes of time, including in depth chart review, independent review of results as outlined above, communicating results with the patient directly, face-to-face time with the patient, coordinating care, and ordering studies and medications as appropriate, and documentation.

## 2023-11-05 NOTE — Patient Instructions (Addendum)
 Your provider has requested that you have an abdominal x ray before leaving today. Please go to the basement floor to our Radiology department for the test.  Your provider has requested that you go to the basement level for lab work before leaving today. Press "B" on the elevator. The lab is located at the first door on the left as you exit the elevator.  We have sent the following medications to your pharmacy for you to pick up at your convenience: Omeprazole, Colestipol  Start taking daily fiber supplement in the morning take the day before Aqua therapy and hold the day of Aqua therapy  Follow up in 6 months  If your blood pressure at your visit was 140/90 or greater, please contact your primary care physician to follow up on this.  _______________________________________________________  If you are age 59 or older, your body mass index should be between 23-30. Your Body mass index is 32.77 kg/m. If this is out of the aforementioned range listed, please consider follow up with your Primary Care Provider.  If you are age 31 or younger, your body mass index should be between 19-25. Your Body mass index is 32.77 kg/m. If this is out of the aformentioned range listed, please consider follow up with your Primary Care Provider.   ________________________________________________________  The [facility] providers would like to encourage you to use [facility] to communicate with providers for non-urgent requests or questions.  Due to long hold times on the telephone, sending your provider a message by [facility] may be a faster and more efficient way to get a response.  Please allow 48 business hours for a response.  Please remember that this is for non-urgent requests.  _______________________________________________________  Due to recent changes in healthcare laws, you may see the results of your imaging and laboratory studies on MyChart before your provider has had a chance to review them.  We  understand that in some cases there may be results that are confusing or concerning to you. Not all laboratory results come back in the same time frame and the provider may be waiting for multiple results in order to interpret others.  Please give [place] 48 hours in order for your provider to thoroughly review all the results before contacting the office for clarification of your results.    Thank you for entrusting me with your care and for choosing [facility],  Dr. Eulah Pont

## 2023-11-10 ENCOUNTER — Ambulatory Visit: Admitting: Podiatry

## 2023-11-11 ENCOUNTER — Other Ambulatory Visit

## 2023-11-11 DIAGNOSIS — M170 Bilateral primary osteoarthritis of knee: Secondary | ICD-10-CM | POA: Diagnosis not present

## 2023-11-11 DIAGNOSIS — K52839 Microscopic colitis, unspecified: Secondary | ICD-10-CM | POA: Diagnosis not present

## 2023-11-11 DIAGNOSIS — K219 Gastro-esophageal reflux disease without esophagitis: Secondary | ICD-10-CM | POA: Diagnosis not present

## 2023-11-11 DIAGNOSIS — R159 Full incontinence of feces: Secondary | ICD-10-CM | POA: Diagnosis not present

## 2023-11-11 DIAGNOSIS — R110 Nausea: Secondary | ICD-10-CM | POA: Diagnosis not present

## 2023-11-12 DIAGNOSIS — R5383 Other fatigue: Secondary | ICD-10-CM | POA: Diagnosis not present

## 2023-11-12 DIAGNOSIS — Z79899 Other long term (current) drug therapy: Secondary | ICD-10-CM | POA: Diagnosis not present

## 2023-11-12 DIAGNOSIS — Z111 Encounter for screening for respiratory tuberculosis: Secondary | ICD-10-CM | POA: Diagnosis not present

## 2023-11-12 DIAGNOSIS — L4059 Other psoriatic arthropathy: Secondary | ICD-10-CM | POA: Diagnosis not present

## 2023-11-15 ENCOUNTER — Encounter: Payer: Self-pay | Admitting: Internal Medicine

## 2023-11-17 ENCOUNTER — Other Ambulatory Visit: Payer: Self-pay | Admitting: Internal Medicine

## 2023-11-18 ENCOUNTER — Encounter: Payer: Self-pay | Admitting: Podiatry

## 2023-11-18 ENCOUNTER — Ambulatory Visit (INDEPENDENT_AMBULATORY_CARE_PROVIDER_SITE_OTHER): Admitting: Podiatry

## 2023-11-18 VITALS — Ht 66.0 in | Wt 203.0 lb

## 2023-11-18 DIAGNOSIS — M79674 Pain in right toe(s): Secondary | ICD-10-CM | POA: Diagnosis not present

## 2023-11-18 DIAGNOSIS — M170 Bilateral primary osteoarthritis of knee: Secondary | ICD-10-CM | POA: Diagnosis not present

## 2023-11-18 DIAGNOSIS — M79675 Pain in left toe(s): Secondary | ICD-10-CM

## 2023-11-18 DIAGNOSIS — E1142 Type 2 diabetes mellitus with diabetic polyneuropathy: Secondary | ICD-10-CM

## 2023-11-18 DIAGNOSIS — M14672 Charcot's joint, left ankle and foot: Secondary | ICD-10-CM

## 2023-11-18 DIAGNOSIS — B351 Tinea unguium: Secondary | ICD-10-CM

## 2023-11-18 NOTE — Progress Notes (Signed)
This patient returns to my office for at risk foot care.  This patient requires this care by a professional since this patient will be at risk due to having peripheral neuropathy. This patient is unable to cut nails herself since the patient cannot reach her nails.These nails are painful walking and wearing shoes.  This patient presents for at risk foot care today. Previous foot surgery for Charcot foot by Liz Malady. ? ?General Appearance  Alert, conversant and in no acute stress. ? ?Vascular  Dorsalis pedis and posterior tibial  pulses are weakly  palpable  bilaterally.  Capillary return is within normal limits  bilaterally. Cold feet.  bilaterally. ? ?Neurologic  Senn-Weinstein monofilament wire test diminished  bilaterally. Muscle power within normal limits bilaterally. ? ?Nails Thick disfigured discolored nails with subungual debris  from hallux to fifth toes bilaterally. No evidence of bacterial infection or drainage bilaterally. ? ?Orthopedic  No limitations of motion  feet .  No crepitus or effusions noted.  No bony pathology or digital deformities noted. Charcot foot left foot. ? ?Skin  normotropic skin with no porokeratosis noted bilaterally.  No signs of infections or ulcers noted.    ? ?Onychomycosis  Pain in right toes  Pain in left toes ? ?Consent was obtained for treatment procedures.   Mechanical debridement of nails 1-5  bilaterally performed with a nail nipper.  Filed with dremel without incident.  ? ? ?Return office visit    3 months                  Told patient to return for periodic foot care and evaluation due to potential at risk complications. ? ? ?Gardiner Barefoot DPM   ?

## 2023-11-19 ENCOUNTER — Encounter: Payer: Self-pay | Admitting: Internal Medicine

## 2023-11-19 LAB — PANCREATIC ELASTASE, FECAL: Pancreatic Elastase-1, Stool: 252 ug/g (ref >200)

## 2023-11-21 ENCOUNTER — Other Ambulatory Visit: Payer: Self-pay | Admitting: Internal Medicine

## 2023-11-22 ENCOUNTER — Encounter: Payer: Self-pay | Admitting: Internal Medicine

## 2023-11-25 DIAGNOSIS — M170 Bilateral primary osteoarthritis of knee: Secondary | ICD-10-CM | POA: Diagnosis not present

## 2023-11-27 ENCOUNTER — Encounter: Payer: Self-pay | Admitting: Internal Medicine

## 2023-11-28 MED ORDER — ALPRAZOLAM 0.25 MG PO TABS
0.2500 mg | ORAL_TABLET | Freq: Two times a day (BID) | ORAL | 5 refills | Status: DC | PRN
Start: 2023-11-28 — End: 2024-01-15

## 2023-11-28 MED ORDER — CYCLOBENZAPRINE HCL 10 MG PO TABS: ORAL_TABLET | ORAL | 1 refills | Status: DC

## 2023-11-29 ENCOUNTER — Encounter: Payer: Self-pay | Admitting: Cardiovascular Disease

## 2023-12-02 DIAGNOSIS — L821 Other seborrheic keratosis: Secondary | ICD-10-CM | POA: Diagnosis not present

## 2023-12-02 DIAGNOSIS — D225 Melanocytic nevi of trunk: Secondary | ICD-10-CM | POA: Diagnosis not present

## 2023-12-02 DIAGNOSIS — L570 Actinic keratosis: Secondary | ICD-10-CM | POA: Diagnosis not present

## 2023-12-02 DIAGNOSIS — D692 Other nonthrombocytopenic purpura: Secondary | ICD-10-CM | POA: Diagnosis not present

## 2023-12-02 DIAGNOSIS — D1801 Hemangioma of skin and subcutaneous tissue: Secondary | ICD-10-CM | POA: Diagnosis not present

## 2023-12-02 DIAGNOSIS — L718 Other rosacea: Secondary | ICD-10-CM | POA: Diagnosis not present

## 2023-12-05 ENCOUNTER — Other Ambulatory Visit: Payer: Self-pay | Admitting: Internal Medicine

## 2023-12-16 DIAGNOSIS — Z79899 Other long term (current) drug therapy: Secondary | ICD-10-CM | POA: Diagnosis not present

## 2023-12-16 DIAGNOSIS — L4059 Other psoriatic arthropathy: Secondary | ICD-10-CM | POA: Diagnosis not present

## 2023-12-20 ENCOUNTER — Telehealth: Payer: Self-pay

## 2023-12-20 ENCOUNTER — Encounter: Payer: Self-pay | Admitting: Internal Medicine

## 2023-12-21 ENCOUNTER — Encounter: Payer: Self-pay | Admitting: Internal Medicine

## 2023-12-21 ENCOUNTER — Ambulatory Visit (INDEPENDENT_AMBULATORY_CARE_PROVIDER_SITE_OTHER): Admitting: Internal Medicine

## 2023-12-21 VITALS — BP 120/64 | HR 69 | Temp 98.7°F | Ht 66.0 in

## 2023-12-21 DIAGNOSIS — L97511 Non-pressure chronic ulcer of other part of right foot limited to breakdown of skin: Secondary | ICD-10-CM | POA: Diagnosis not present

## 2023-12-21 DIAGNOSIS — S91331A Puncture wound without foreign body, right foot, initial encounter: Secondary | ICD-10-CM

## 2023-12-21 MED ORDER — ACYCLOVIR 5 % EX OINT: 1.0000 | TOPICAL_OINTMENT | CUTANEOUS | 5 refills | Status: DC

## 2023-12-21 MED ORDER — AMOXICILLIN-POT CLAVULANATE 875-125 MG PO TABS: 1.0000 | ORAL_TABLET | Freq: Two times a day (BID) | ORAL | 0 refills | Status: AC

## 2023-12-21 NOTE — Assessment & Plan Note (Signed)
 Subacute Has 2 superficial ulcers on the dorsal aspect of her right foot-likely irritation from shoe Has been applying antibacterial ointment Mild surrounding erythema Will be starting Augmentin  875-125 mg twice daily x 10 days which will help with plantar puncture wound in addition to these wounds She will monitor closely

## 2023-12-21 NOTE — Progress Notes (Signed)
 Subjective:    Patient ID: [name] [name], female    DOB: 1945-10-16, 78 y.o.   MRN: [id number]      HPI [name] [name] is here for  Chief Complaint  Patient presents with   Wound Infection    Wound on bottom of foot; Noticed on Sunday when she saw blood on the floor     Sunday, 2 days ago- noticed wound on bottom of right foot.  Denies any pain, but she has neuropathy so would not feel anything most likely.  She noticed that because there was a lot of blood on the bottom of her foot.  She does not recall stepping on anything and did not see anything on the floor that she may have stepped on.  Her husband was able to stop the bleeding.  Her son-in-law cleaned out the wound.  There has been no bleeding since.  No pus discharge.  No fevers.  She has 2 small superficial wounds on the top of her right foot that she has been putting antibacterial ointment on.  She thought they did get better, but they have recurred and she thinks it is related to rubbing from one of her shoes.     Medications and allergies reviewed with patient and updated if appropriate.  Current Outpatient Medications on File Prior to Visit  Medication Sig Dispense Refill   acetaminophen  (TYLENOL ) 500 MG tablet Take 1 tablet (500 mg total) by mouth every 6 (six) hours as needed for mild pain or fever.     acyclovir ointment (ZOVIRAX) 5 % Apply 1 Application topically every 3 (three) hours.     allopurinol  (ZYLOPRIM ) 100 MG tablet TAKE 1 TABLET BY MOUTH TWICE A DAY 180 tablet 2   ALPRAZolam  (XANAX ) 0.25 MG tablet Take 1 tablet (0.25 mg total) by mouth 2 (two) times daily as needed. 60 tablet 5   augmented betamethasone dipropionate (DIPROLENE-AF) 0.05 % cream Apply topically.     azelastine  (ASTELIN ) 0.1 % nasal spray INSTILL 2 SPRAYS IN EACH NOSTRIL TWICE DAILY 30 mL 12   betamethasone dipropionate (DIPROLENE) 0.05 % ointment Apply topically 2 (two) times daily.     Biotin 5000 MCG CAPS Take 5,000 mcg by mouth daily.      budesonide  (ENTOCORT EC ) 3 MG 24 hr capsule TAKE 3 CAPSULES DAILY 180 capsule 5   carvedilol  (COREG ) 6.25 MG tablet Take 1 tablet (6.25 mg total) by mouth 2 (two) times daily. Take 6.25 mg by mouth 2 (two) times daily. 180 tablet 2   cholecalciferol  (VITAMIN D3) 25 MCG (1000 UNIT) tablet Take 1,000 Units by mouth daily.     clobetasol cream (TEMOVATE) 0.05 % Apply 1 Application topically as needed.     colestipol  (COLESTID ) 1 g tablet Take 2 tablets (2 g total) by mouth 2 (two) times daily. 360 tablet 2   cyanocobalamin  (VITAMIN B12) 1000 MCG tablet Take 1,000 mcg by mouth daily.     cyclobenzaprine  (FLEXERIL ) 10 MG tablet TAKE 1 TABLET BY MOUTH EVERYDAY AT BEDTIME 90 tablet 1   doxycycline  (VIBRAMYCIN ) 100 MG capsule Take 100 mg by mouth daily.     folic acid  (FOLVITE ) 1 MG tablet Take 1 mg by mouth daily.     insulin  glargine (LANTUS  SOLOSTAR) 100 UNIT/ML Solostar Pen Inject 20 Units into the skin 2 (two) times daily. 36 mL 2   lisinopril  (ZESTRIL ) 20 MG tablet TAKE 1 TABLET BY MOUTH EVERY DAY 90 tablet 3   lisinopril -hydrochlorothiazide  (ZESTORETIC ) 20-25 MG tablet  TAKE 1 TABLET EVERY DAY 90 tablet 1   loperamide  (IMODIUM ) 2 MG capsule Take 1 capsule (2 mg total) by mouth daily. 90 capsule 3   melatonin 5 MG TABS Take 5 mg by mouth at bedtime.     metFORMIN  (GLUCOPHAGE -XR) 500 MG 24 hr tablet TAKE 1 TABLET BY MOUTH TWICE A DAY 180 tablet 2   methotrexate  (RHEUMATREX) 2.5 MG tablet Take 17.5 mg by mouth every Sunday at 6pm.     metroNIDAZOLE  (METROCREAM ) 0.75 % cream SMARTSIG:sparingly Topical Twice Daily     nystatin  (MYCOSTATIN ) 100000 UNIT/ML suspension Take 5 mLs (500,000 Units total) by mouth in the morning, at noon, and at bedtime. 60 mL 0   omeprazole  (PRILOSEC) 40 MG capsule Take 1 capsule (40 mg total) by mouth 2 (two) times daily. 180 capsule 1   ondansetron  (ZOFRAN -ODT) 4 MG disintegrating tablet Take 1 tablet (4 mg total) by mouth every 8 (eight) hours as needed for nausea or  vomiting. 20 tablet 0   oxyCODONE -acetaminophen  (PERCOCET) 10-325 MG tablet Take 1 tablet by mouth 3 (three) times daily as needed.     polyethylene glycol (MIRALAX  / GLYCOLAX ) 17 g packet Take 17 g by mouth daily as needed for moderate constipation.     potassium chloride  (KLOR-CON ) 10 MEQ tablet TAKE 3 TABLETS BY MOUTH EVERY DAY 270 tablet 2   Probiotic Product (PROBIOTIC DAILY PO) Take 1 capsule by mouth daily.     rosuvastatin  (CRESTOR ) 5 MG tablet TAKE 1 TABLET BY MOUTH EVERY DAY 90 tablet 2   sertraline  (ZOLOFT ) 100 MG tablet TAKE 1.5 TABLETS (150MG  TOTAL) BY MOUTH DAILY 135 tablet 1   No current facility-administered medications on file prior to visit.    Review of Systems     Objective:   Vitals:   12/21/23 1615  BP: 120/64  Pulse: 69  Temp: 98.7 F (37.1 C)  SpO2: 96%   BP Readings from Last 3 Encounters:  12/21/23 120/64  11/05/23 112/68  09/20/23 120/78   Wt Readings from Last 3 Encounters:  11/18/23 203 lb (92.1 kg)  11/05/23 203 lb (92.1 kg)  10/27/23 203 lb (92.1 kg)   Body mass index is 32.77 kg/m.    Physical Exam Constitutional:      General: She is not in acute distress.    Appearance: Normal appearance. She is not ill-appearing.  HENT:     Head: Normocephalic and atraumatic.  Skin:    General: Skin is warm and dry.     Comments: Small puncture wound medial plantar surface of right foot without active bleeding/discharge.  No surrounding erythema.  No fluctuance, induration.  Dorsal aspect of the medial right midfoot.  2 superficial ulcerations that appear to be healing with some surrounding erythema.  No active bleeding/discharge.  No fluctuance, induration.  Neurological:     Mental Status: She is alert.            Assessment & Plan:    See Problem List for Assessment and Plan of chronic medical problems.

## 2023-12-21 NOTE — Assessment & Plan Note (Signed)
 Acute 2 days ago noticed a puncture wound on the bottom of her right medial midfoot Initially a good amount of bleeding, but no bleeding since, no pus No fluctuance, induration or surrounding erythema She does have neuropathy Diabetes is controlled Start Augmentin  875-125 mg twice daily x 10 days given high risk of infection and complications from infection She will continue to apply antibacterial ointment and keep it covered

## 2023-12-21 NOTE — Patient Instructions (Signed)
      Medications changes include :   Augmentin  twice daily x 10 days, continue the topical antibacterial ointment    Monitor your wound closely.  Follow up with podiatry.

## 2023-12-29 MED ORDER — MUPIROCIN 2 % EX OINT: 1.0000 | TOPICAL_OINTMENT | Freq: Two times a day (BID) | CUTANEOUS | 2 refills | Status: DC

## 2023-12-29 MED ORDER — FLUCONAZOLE 150 MG PO TABS
150.0000 mg | ORAL_TABLET | Freq: Once | ORAL | 0 refills | Status: AC
Start: 2023-12-29 — End: 2023-12-29

## 2023-12-31 ENCOUNTER — Encounter: Payer: Self-pay | Admitting: Internal Medicine

## 2023-12-31 ENCOUNTER — Other Ambulatory Visit: Payer: Self-pay

## 2023-12-31 MED ORDER — ONDANSETRON 4 MG PO TBDP: 4.0000 mg | ORAL_TABLET | Freq: Three times a day (TID) | ORAL | 0 refills | Status: DC | PRN

## 2024-01-03 ENCOUNTER — Encounter: Payer: Self-pay | Admitting: Internal Medicine

## 2024-01-03 DIAGNOSIS — G4733 Obstructive sleep apnea (adult) (pediatric): Secondary | ICD-10-CM

## 2024-01-04 ENCOUNTER — Encounter: Payer: Self-pay | Admitting: Cardiovascular Disease

## 2024-01-06 DIAGNOSIS — L4059 Other psoriatic arthropathy: Secondary | ICD-10-CM | POA: Diagnosis not present

## 2024-01-06 DIAGNOSIS — Z79899 Other long term (current) drug therapy: Secondary | ICD-10-CM | POA: Diagnosis not present

## 2024-01-06 DIAGNOSIS — M1991 Primary osteoarthritis, unspecified site: Secondary | ICD-10-CM | POA: Diagnosis not present

## 2024-01-06 DIAGNOSIS — M1009 Idiopathic gout, multiple sites: Secondary | ICD-10-CM | POA: Diagnosis not present

## 2024-01-06 DIAGNOSIS — K5289 Other specified noninfective gastroenteritis and colitis: Secondary | ICD-10-CM | POA: Diagnosis not present

## 2024-01-06 DIAGNOSIS — Z6832 Body mass index (BMI) 32.0-32.9, adult: Secondary | ICD-10-CM | POA: Diagnosis not present

## 2024-01-06 DIAGNOSIS — L408 Other psoriasis: Secondary | ICD-10-CM | POA: Diagnosis not present

## 2024-01-06 DIAGNOSIS — E669 Obesity, unspecified: Secondary | ICD-10-CM | POA: Diagnosis not present

## 2024-01-10 ENCOUNTER — Other Ambulatory Visit: Payer: Self-pay | Admitting: Internal Medicine

## 2024-01-10 ENCOUNTER — Encounter: Payer: Self-pay | Admitting: Cardiovascular Disease

## 2024-01-10 ENCOUNTER — Encounter: Payer: Self-pay | Admitting: Internal Medicine

## 2024-01-11 NOTE — Telephone Encounter (Signed)
 Patient stated she was supposed to have an appointment within 2 weeks and her PAP device adjusted. Sleep coordinator notified patient, per Dr. Loetta Ringer, Patietn needs to stop turning her device off during the night and turning it back on several times. Dr. Loetta Ringer requested a download after 2 weeks of compliance. Patient requested in person visit. LN will call patient and get her scheduled.

## 2024-01-11 NOTE — Progress Notes (Unsigned)
 Subjective:    Patient ID: [name] [name], female    DOB: [birth year], 78 y.o.   MRN: [id number]      HPI [name] [name] is here for No chief complaint on file.    Burn - left index finger -     Medications and allergies reviewed with patient and updated if appropriate.  Current Outpatient Medications on File Prior to Visit  Medication Sig Dispense Refill   acetaminophen  (TYLENOL ) 500 MG tablet Take 1 tablet (500 mg total) by mouth every 6 (six) hours as needed for mild pain or fever.     acyclovir  ointment (ZOVIRAX ) 5 % Apply 1 Application topically every 3 (three) hours. 15 g 5   allopurinol  (ZYLOPRIM ) 100 MG tablet TAKE 1 TABLET BY MOUTH TWICE A DAY 180 tablet 2   ALPRAZolam  (XANAX ) 0.25 MG tablet Take 1 tablet (0.25 mg total) by mouth 2 (two) times daily as needed. 60 tablet 5   augmented betamethasone dipropionate (DIPROLENE-AF) 0.05 % cream Apply topically.     Azelastine  HCl 137 MCG/SPRAY SOLN INSTILL 2 SPRAYS IN EACH NOSTRIL TWICE DAILY 30 mL 12   betamethasone dipropionate (DIPROLENE) 0.05 % ointment Apply topically 2 (two) times daily.     Biotin 5000 MCG CAPS Take 5,000 mcg by mouth daily.     budesonide  (ENTOCORT EC ) 3 MG 24 hr capsule TAKE 3 CAPSULES DAILY 180 capsule 5   carvedilol  (COREG ) 6.25 MG tablet Take 1 tablet (6.25 mg total) by mouth 2 (two) times daily. Take 6.25 mg by mouth 2 (two) times daily. 180 tablet 2   cholecalciferol  (VITAMIN D3) 25 MCG (1000 UNIT) tablet Take 1,000 Units by mouth daily.     clobetasol cream (TEMOVATE) 0.05 % Apply 1 Application topically as needed.     colestipol  (COLESTID ) 1 g tablet Take 2 tablets (2 g total) by mouth 2 (two) times daily. 360 tablet 2   cyanocobalamin  (VITAMIN B12) 1000 MCG tablet Take 1,000 mcg by mouth daily.     cyclobenzaprine  (FLEXERIL ) 10 MG tablet TAKE 1 TABLET BY MOUTH EVERYDAY AT BEDTIME 90 tablet 1   doxycycline  (VIBRAMYCIN ) 100 MG capsule Take 100 mg by mouth daily.     folic acid  (FOLVITE ) 1 MG tablet  Take 1 mg by mouth daily.     insulin  glargine (LANTUS  SOLOSTAR) 100 UNIT/ML Solostar Pen Inject 20 Units into the skin 2 (two) times daily. 36 mL 2   lisinopril  (ZESTRIL ) 20 MG tablet TAKE 1 TABLET BY MOUTH EVERY DAY 90 tablet 3   lisinopril -hydrochlorothiazide  (ZESTORETIC ) 20-25 MG tablet TAKE 1 TABLET EVERY DAY 90 tablet 1   loperamide  (IMODIUM ) 2 MG capsule Take 1 capsule (2 mg total) by mouth daily. 90 capsule 3   melatonin 5 MG TABS Take 5 mg by mouth at bedtime.     metFORMIN  (GLUCOPHAGE -XR) 500 MG 24 hr tablet TAKE 1 TABLET BY MOUTH TWICE A DAY 180 tablet 2   methotrexate  (RHEUMATREX) 2.5 MG tablet Take 17.5 mg by mouth every Sunday at 6pm.     metroNIDAZOLE  (METROCREAM ) 0.75 % cream SMARTSIG:sparingly Topical Twice Daily     mupirocin  ointment (BACTROBAN ) 2 % Apply 1 Application topically 2 (two) times daily. 30 g 2   nystatin  (MYCOSTATIN ) 100000 UNIT/ML suspension Take 5 mLs (500,000 Units total) by mouth in the morning, at noon, and at bedtime. 60 mL 0   omeprazole  (PRILOSEC) 40 MG capsule Take 1 capsule (40 mg total) by mouth 2 (two) times daily. 180 capsule 1  ondansetron  (ZOFRAN -ODT) 4 MG disintegrating tablet Take 1 tablet (4 mg total) by mouth every 8 (eight) hours as needed for nausea or vomiting. 20 tablet 0   oxyCODONE -acetaminophen  (PERCOCET) 10-325 MG tablet Take 1 tablet by mouth 3 (three) times daily as needed.     polyethylene glycol (MIRALAX  / GLYCOLAX ) 17 g packet Take 17 g by mouth daily as needed for moderate constipation.     potassium chloride  (KLOR-CON ) 10 MEQ tablet TAKE 3 TABLETS BY MOUTH EVERY DAY 270 tablet 2   Probiotic Product (PROBIOTIC DAILY PO) Take 1 capsule by mouth daily.     rosuvastatin  (CRESTOR ) 5 MG tablet TAKE 1 TABLET BY MOUTH EVERY DAY 90 tablet 2   sertraline  (ZOLOFT ) 100 MG tablet TAKE 1.5 TABLETS (150MG  TOTAL) BY MOUTH DAILY 135 tablet 1   No current facility-administered medications on file prior to visit.    Review of Systems      Objective:  There were no vitals filed for this visit. BP Readings from Last 3 Encounters:  12/21/23 120/64  11/05/23 112/68  09/20/23 120/78   Wt Readings from Last 3 Encounters:  11/18/23 203 lb (92.1 kg)  11/05/23 203 lb (92.1 kg)  10/27/23 203 lb (92.1 kg)   There is no height or weight on file to calculate BMI.    Physical Exam         Assessment & Plan:    See Problem List for Assessment and Plan of chronic medical problems.

## 2024-01-12 ENCOUNTER — Encounter: Payer: Self-pay | Admitting: Internal Medicine

## 2024-01-12 ENCOUNTER — Ambulatory Visit (INDEPENDENT_AMBULATORY_CARE_PROVIDER_SITE_OTHER): Admitting: Internal Medicine

## 2024-01-12 VITALS — BP 120/70 | HR 64 | Temp 98.1°F | Ht 66.0 in

## 2024-01-12 DIAGNOSIS — I10 Essential (primary) hypertension: Secondary | ICD-10-CM

## 2024-01-12 DIAGNOSIS — T23222A Burn of second degree of single left finger (nail) except thumb, initial encounter: Secondary | ICD-10-CM | POA: Diagnosis not present

## 2024-01-12 NOTE — Assessment & Plan Note (Signed)
 Acute Burn occurred 5 days ago Minimal erythema that appears to be normal for healing and possibly related to the Band-Aid No evidence of infection Continue applying antibacterial ointment and keeping it covered when using the hand for when out of the apartment Will avoid too much water  exposure Will continue to change the bandage twice daily and if she sees any evidence of infection I will call in an oral antibiotic Would expect this to heal on its own If she is not seeing improvement over the next 1-2 weeks she will let me know-do not think she needs a skin graft at this time, but will see how it heals

## 2024-01-12 NOTE — Assessment & Plan Note (Signed)
Chronic Blood pressure well controlled Continue carvedilol 6.25 mg twice daily, lisinopril 20 mg daily

## 2024-01-12 NOTE — Patient Instructions (Addendum)
    Keep applying the antibacterial ointment and keep it covered when you are using it or out and about.     Medications changes include :   None     Return if symptoms worsen or fail to improve.

## 2024-01-13 DIAGNOSIS — L4059 Other psoriatic arthropathy: Secondary | ICD-10-CM | POA: Diagnosis not present

## 2024-01-14 MED ORDER — LISINOPRIL-HYDROCHLOROTHIAZIDE 20-25 MG PO TABS: 1.0000 | ORAL_TABLET | Freq: Every day | ORAL | 3 refills | Status: DC

## 2024-01-15 ENCOUNTER — Encounter: Payer: Self-pay | Admitting: Internal Medicine

## 2024-01-15 MED ORDER — ALPRAZOLAM 0.25 MG PO TABS: 0.2500 mg | ORAL_TABLET | Freq: Two times a day (BID) | ORAL | 5 refills | Status: AC | PRN

## 2024-01-20 DIAGNOSIS — M47896 Other spondylosis, lumbar region: Secondary | ICD-10-CM | POA: Diagnosis not present

## 2024-01-20 DIAGNOSIS — M5416 Radiculopathy, lumbar region: Secondary | ICD-10-CM | POA: Diagnosis not present

## 2024-01-20 DIAGNOSIS — M51369 Other intervertebral disc degeneration, lumbar region without mention of lumbar back pain or lower extremity pain: Secondary | ICD-10-CM | POA: Diagnosis not present

## 2024-01-23 ENCOUNTER — Other Ambulatory Visit: Payer: Self-pay | Admitting: Internal Medicine

## 2024-01-25 ENCOUNTER — Ambulatory Visit: Attending: Cardiovascular Disease | Admitting: Cardiovascular Disease

## 2024-01-25 ENCOUNTER — Encounter: Payer: Self-pay | Admitting: Cardiovascular Disease

## 2024-01-25 DIAGNOSIS — E6609 Other obesity due to excess calories: Secondary | ICD-10-CM | POA: Diagnosis not present

## 2024-01-25 DIAGNOSIS — I4510 Unspecified right bundle-branch block: Secondary | ICD-10-CM | POA: Diagnosis not present

## 2024-01-25 DIAGNOSIS — Z6832 Body mass index (BMI) 32.0-32.9, adult: Secondary | ICD-10-CM

## 2024-01-25 DIAGNOSIS — E66811 Obesity, class 1: Secondary | ICD-10-CM

## 2024-01-25 DIAGNOSIS — G4733 Obstructive sleep apnea (adult) (pediatric): Secondary | ICD-10-CM | POA: Diagnosis not present

## 2024-01-25 DIAGNOSIS — E785 Hyperlipidemia, unspecified: Secondary | ICD-10-CM

## 2024-01-25 DIAGNOSIS — L4050 Arthropathic psoriasis, unspecified: Secondary | ICD-10-CM

## 2024-01-25 DIAGNOSIS — I10 Essential (primary) hypertension: Secondary | ICD-10-CM | POA: Diagnosis not present

## 2024-01-25 DIAGNOSIS — E118 Type 2 diabetes mellitus with unspecified complications: Secondary | ICD-10-CM | POA: Diagnosis not present

## 2024-01-25 NOTE — Patient Instructions (Signed)
 Medication Instructions:  Your physician recommends that you continue on your current medications as directed. Please refer to the Current Medication list given to you today.  *If you need a refill on your cardiac medications before your next appointment, please call your pharmacy*   Follow-Up: At [facility], you and your health needs are our priority.  As part of our continuing mission to provide you with exceptional heart care, our providers are all part of one team.  This team includes your primary Cardiologist (physician) and Advanced Practice Providers or APPs (Physician Assistants and Nurse Practitioners) who all work together to provide you with the care you need, when you need it.  Your next appointment:   6 month(s)  Provider:    Harrold Lincoln, MD    Other Instructions If you have any questions or concerns regarding your c-pap, bi-pap or sleep accessories, please contact Brandie Rorie at [phone].

## 2024-01-25 NOTE — Progress Notes (Addendum)
 Patient ID: TYKERA SKATES, female   DOB: 06/17/1946, 78 y.o.   MRN: [id number]        HPI: TAKINA BUSSER is a 78 y.o. female percents in the office today for a 7 month followup sleep/cardiology evaluation.  Ms. Kerin is a retired [employer] who has a history of hypertension, obesity, psoriatic arthritis, DVT, which occurred while traveling to [place], as well as complex obstructive sleep apnea.  She has had difficulty in the past with CPAP therapy. She has been able to tolerate BiPAP and has had a Respironics BiPAP Auto unit well but also has had difficulty with some of her high pressure requirements. In April 2014 I changed her maximum BiPAP pressure to 19 and her maximum EPAP pressure to 15. I also reduced her minimum EPAP pressure to 6 and her minimal IPAP pressure to 10 with pressure support of 4 and changed her Bi_Flex to 3. She has been followed by [facility].  Clinically, she had felt markedly improved with BiPAP.  Compared to prior CPAP therapy.  She has psoriatic arthritis and is on Humira as well as methotrexate  followed by rheumatology.  She also has a Charcot joint in her right foot.   She is on Cymbalta for pain relief relative to this. She does not routinely exercise.  She keeps busy moving around, but typically does not do aerobic activity.  She has not been successful with significant weight loss.   She recently, she has been taking lisinopril  HCTZ 20/12.5 mg for hypertension.  She had experienced an episode of chest discomfort which she felt was like a cramp and she noticed this most when standing up, but was associated with diaphoresis and shortness of breath.  She does have family history for coronary artery disease both in her mother as well as in her maternal grandmother. She has not been able to be very active due to her Charcot foot.  She also had noticed some mild shortness of breath with activity.  She eventually was referred for a nuclear perfusion study on 01/31/2015  which revealed normal perfusion and function; ejection fraction 64%.  When I saw her in February 2017, her BiPAP machine had begun to fail.  She continues to use CPAP with 100% compliance and cannot sleep without it.  She typically sleeps for at least 8 hours per night.  She had taken her BiPAP machine to [facility] who stated that her machine essentially had stopped working and is in need for new machine.   She received a new Respironics Dream station auto BiPAP unit in April 2017.  She uses a Field seismologist FX full face mask, medium size.  She feels that the machine has been working well but she has had difficulty with her sleep.  At times she feels that she is not getting enough pressure.  Previously she had been set on a BiPAP auto unit with an EPAP minimum of 8 and IPAP max of 19 , which had worked well.  I obtained a new download today from 02/16/2016 through 03/16/2016.  She is 100% compliant and is averaging 8 hours and 25 minutes of sleep.  Apparently, she is not been set on auto and has been on and IPAP of 14 and an EPAP of 10.  Her AHI is increased at 25.1. I changed her BiPAP mode from a fixed pressure to auto BiPAP initiating atenolol over 6 with potential titration up to 20/16.  I also allowed for pressure support to change from 4-6 as  needed.  In 2018, she felt well from a cardiac perspective, but has had significant infections including MRSA, strep throat, UTI, and she had fractured her right ankle.  As result, she has noticed some ankle swelling right greater than left.  She continues to use her BiPAP and believes she is feeling much better than she had in the past.  [employer] is her DME company.  A download in the office from 01/26/2017 through 04/25/2017.  She is 100% compliance.  She is averaging 8 hours and 36 minutes of sleep per night.  Her average device IPAP pressure was 18.2 and average device CPAP pressure greater than 90% of the time was 14.1.  AHI continued to be elevated at 21.2. An Epworth  scale score was calculated in the office today and this endorsed at 4, arguing against daytime sleepiness.  I last saw her, she was sleeping adequate duration.  Her AHI remains elevated I suggested that she reduce ramp time from 30 minutes down to 20 minutes and increased her starting pressure from 4 up to 6 cm.  I saw her in September 2019 at which time she had again started to notice  symptoms where she was waking up at night.  She noted leaks from her mask at higher pressures.  She feels that she is more  tired.  She had some swelling right lower extremity greater than left and she has a Charcot right foot.  She had broken her right ankle in August 2018.  She denied any chest pain.  She is unaware of any cardiac arrhythmias.  Her daughter, Dr. Suzen Brass is a GI physician who had been practicing in [place]  and will be moving up to work with [facility].  She is grateful that she will now have her twin granddaughters close by.  She was recently placed on Cimzia  for her psoriatic arthritis to take in addition to her methotrexate .  She is followed by Dr. Jon Jacob for her rheumatologic issues.   When I evaluated her in December 2019 she wassleeping 8 hours 29 minutes and her average IPAP pressure greater than 90% 16.7 with an average EPAP pressure of 12.4.  I reduced her ramp time down to 20 minutes.  Having issues with her mask and I recommended a trial of the new DreamWear full facemask by either a Respironics or a ResMed.  Over the past several months, she has not been able to be active due to her fracture of her fifth metatarsal.  She was in a hard cast for 2 months with no weightbearing.  As result she has not been able to exercise.  She has gained weight.  Was just removed several weeks ago.  She admits to ankle swelling.  She continues to use her BiPAP.  A new download was obtained and she again is compliant.  Her average device IPAP pressure greater than 90% of the time was  18.1 with a maximum titrated IPAP pressure of 20.  AHI remained elevated at 17.9.    She went to [place]  in January 2020 and unfortunately became ill and spent the entire trip in the hospital due to ischemic colitis.  She was subsequently evaluated by Jon Hails in a telemedicine visit in June 2020.  When I last evaluated her in a telemedicine visit in June 2021 she had not been able to do much walking because of tenderness from her bone on the plantar aspect of her foot.  She has fallen arches.  She  is being evaluated by Dr. Kit.  Around Thanksgiving 2020 both she and her husband both became ill with COVID-19 pneumonia and both were hospitalized for approximately a week.  She had temperatures of 103, she was treated with remdesivir , convalescent plasma in addition to steroids.  She has subsequently not regained her sense of taste or smell and has continued to experience fatigability.  During that time she was not able to use BiPAP.  Apparently she was having issues stating that Medicare is going to deny her BiPAP.  She has continued to use BiPAP therapy.  I attempted to try to get a download from her Resp[employer]ics website today but we were unable to do this in the office.  I have reached out to Apolinar Hoyle who will contact [employer] to see if we can obtain her most recent download.  Ms. Gattuso continues to be on lisinopril /HCTZ for hypertension, rosuvastatin  5 mg for hyperlipidemia.  She was on methotrexate  for rheumatoid arthritis but this was stopped approximately 6 weeks ago.  She is diabetic on metformin .     I scheduled her for a 2D echo Doppler study to reassess LV function following her Covid infection which was done on February 06, 2020.  LV function was excellent with EF of 65 to 70%.  There was grade 1 diastolic dysfunction.  There was mild mitral regurgitation.   I evaluated her in a telemedicine visit on May 24, 2020.  Since her last evaluation, her Respironics dream station has been on  recall.  She has registered with [employer] and attempt to get a new machine.  She had called the office shortly thereafter wondering if she should still use therapy.  At that time with the severity of her sleep apnea and the incidence of only 0.3% of devices with problems I had recommended she continue therapy.  She uses [employer] as her DME company.  I asked her today if she had been contacted regarding insertion of a bacteria inlet filter device which she could place into her tubing but apparently this had not been done by [employer].  She feels well.  She again has been immobile and that her left leg is in a hard cast making it difficult to walk.  She does note some occasional ankle swelling.  She was wondering if she should continue with aspirin  which she has been taking 81 mg 3 times a week.  She admits to being under increased stress since her husband unfortunately had a brain bleed but is starting to improve.  She has been on lisinopril  HCT 20/25 mg.  She has continued to be on lipid-lowering therapy with rosuvastatin  5 mg and LDL cholesterol was 40.   I last saw her in August 2022.  She was ill in November 2021 with an intestinal virus, she required ICU she tells me she was cared for by Dr. Donnald.  He apparently had an extensive hospital stay.  She has had difficulty with long COVID resulting from her COVID infection in November 2020.  She recently has been under months of extensive care for her left foot ulcers requiring wound care visits for her previous infections which has resolved.  From a sleep perspective, she has had difficulty and recently adjustments were made to her dream station auto BiPAP therapy.  Her device is on recall and she is not been aware of any timeframe when potentially she can receive a new device.  Due to continued difficulty with her unit, approximately 1 month ago her pressures  her pressures were adjusted such that now her ramp time is set at 20 minutes, ramp start pressure 6,  minimum EPAP 14, maximum IPAP 25, with pressure support range of 3 to 8 cm of water .  Her most recent download shows 100% usage with average use at 9 hours and 49 minutes per night.  Her 90% pressures are 22.3/19.0 with maximum titrated pressures at 25/23.  Despite this average AHI continues to be elevated at 16.3.    I saw her on October 20, 2021.  3 days previously she had just  received the new Dream Station BiPAP replacement device.  Her old device had died and she had received a loaner unit from her DME company.  Had lost 45 pounds since October 2022.  At times she  experiences some mild lightheadedness.  She was unaware of palpitations.    I  saw her in May 18, 2022.  Last week she was diagnosed with COVID infection after her son visited her and has been receiving molnuparivir therapy.  She has been using her new DreamWear Respironics auto bilevel device.  However, the device had not been linked to our office.  A download was able to be obtained through the card.  It appears that her minimum EPAP is set at 9 with maximum IPAP 22 with a pressure support range of 0 to 5 cm.  Her humidification is at 3 and she does admit to dry mouth.  A download demonstrates increased AHI at 32.5 with device IPAP EPAP pressure greater than 90% of the time at 17.  As result it does not appear that there has been a pressure support and essentially for pressure support has been 0.  She does admit to some feet swelling.  She denied chest pain.  During that evaluation, I suggested changes be made to her device and recommended she be switched to a maximum IPAP setting of 25 and set her minimum EPAP pressure at 10 cm of water .  I also recommended she change pressure support to a range of 5 to 8 cm of water .  I last saw her on September 22, 2022 at which time she told me that her new Respironics machine had been recalled.   A new download was obtained today but unfortunately this did not provide her most recent data.  AHI was  32.5 and apparently her 90% BiPAP and 90% EPAP were the same at 17.4.  During her evaluation today I suggested that she bring her machine to her office tomorrow so that we can take her card out to obtain new data since apparently we could not obtain that today.  This again showed an significant AHI elevation at 33.6 and it appears that her IPAP and EPAP are similar in it is not utilizing the BiPAP pressures that were recommended.  Her maximum titrated IPAP and EPAP pressures are 22 and 90th percentile pressures were 17.9 and 17.8, respectively.  She continues to use therapy for the nights duration averaging 9 hours and 26 minutes.  She continues to be on carvedilol  6.25 mg twice a day, HCTZ 12.5 mg as needed, lisinopril  20 mg daily for hypertension.  She is diabetic on metformin  500 mg twice a day.  She is on rosuvastatin  5 mg daily for hyperlipidemia.  LDL cholesterol on September 17, 2022 was 57.  During that evaluation I recommended that she undergo a BiPAP/possible ASV since the titration prior to receiving a new device.  Ms. Lecuyer underwent a BiPAP titration on  October 14, 2022.  BiPAP was initiated 8/4 and titrated to maximum optimal pressure 22/18.  AHI was 0, RDI 6.6 and O2 nadir at 93%.  She did not achieve any REM sleep being on the 16/12 titration.  She was not transition to ASV.  She received a new ResMed air curve 10 via auto BiPAP unit on December 08, 2022.  She had called the office stating she was having some issues and I was able to work her into my office schedule by double booking today for evaluation.  A download was obtained from June 24 through March 02, 2023.  She is meeting compliance standards with 100% use.  There is essentially 0 leak.  However, AHI is significantly elevated at 31.9.  Her device 6 is set at a ramp time of 20 minutes, start EPAP and ramp at 5 and following ramp EPAP min of 10 with IPAP max of 25.  Apparently after much discussion it appears that oftentimes she is turning the  machine off and then the machine is restarting again with a ramp.  Her 95th percentile pressures are very elevated at 23.8/18.9 with maximal pressure and 24.6/19.6.  She continues to be on carvedilol  6.25 mg twice a day and lisinopril  20 mg daily for hypertension.  She is diabetic on metformin .  She is on rosuvastatin  5 mg for hyperlipidemia.  She also is on omeprazole  for GI issues and takes methotrexate  for psoriatic arthritis.    she has continued to use a ResMed air curve 10 via auto unit.  I obtained a download from October 13 through June 21, 2023.  Usage is 100% which average use at 7 hours and 30 minutes.  Her pressure is set at a minimum EPAP of 10, maximum IPAP of 25 with pressure support of 5.  She has issues with her mask however there is no leak.  AHI is significantly elevated at 35.1 with an AI index of 33.2 and central index of 0.3.  Upon further questioning, at least 3 times during the night she is turning her machine off to restart.  Apparently her ramp time is set at 20 cm and her start EPAP is only 5 undoubtedly contributing to significant undertreatment for at least 1 hour.  During that evaluation, I recommended that her pressure be changed from a minimum EPAP pressure of 10-13.  I also provided her with 3 different sample masks including an F30, F40, AirTouch F20 mask.  Since I last saw her, she has continued to use CPAP and a download obtained from May 18 through January 24, 2024 continue to show 100% usage.  With her new mask, there is no mask leak.  However AHI continues to be significantly elevated.  Apparently, her CPAP was not adjusted following her last evaluation and often times when the pressure would get high she turns her machine off and then it has been starting again with a very low ramp initiation at only 5 cm's.  This undoubtedly is contributed to her continued AHI elevation.  [employer] is her DME company.  She denies any chest pain or awareness of palpitations.  She continues  to be on carvedilol  6.25 mg twice a day in addition to lisinopril  HCTZ 20/25 mg twice a day for blood pressure control.  She is on very low-dose rosuvastatin  at 5 mg for lipid management.  She is diabetic on insulin  and metformin .  She is on methotrexate  for her psoriatic arthritis she presents for evaluation.   Past Medical History:  Diagnosis Date   Anemia    Anxiety    Arthritis    Closed fracture of fifth metatarsal bone 05/13/2018   Deep vein thrombosis (HCC)    right calf - 05/2012    Depression    Diabetes mellitus without complication (HCC)    diet controlled    GERD (gastroesophageal reflux disease)    Hyperlipidemia    Patient denies.   Hypertension    Ejection fraction =>55% Left ventricular systolic function is normal. Left ventricular wall motion is normal     Lymphocytic colitis    Neuropathy    diabetic - in bilateral feet    Pityriasis lichenoides chronica    Pneumonia    Sleep apnea    bipap    Past Surgical History:  Procedure Laterality Date   BILIARY STENT PLACEMENT  12/28/2022   Procedure: BILIARY STENT PLACEMENT;  Surgeon: Avram Lupita BRAVO, MD;  Location: [facility] ENDOSCOPY;  Service: Gastroenterology;;   BIOPSY  11/09/2022   Procedure: BIOPSY;  Surgeon: Wilhelmenia Aloha Raddle., MD;  Location: [facility];  Service: Gastroenterology;;   CHOLECYSTECTOMY N/A 12/23/2022   Procedure: LAPAROSCOPIC CHOLECYSTECTOMY WITH ICG DYE;  Surgeon: Tanda Locus, MD;  Location: [facility] ORS;  Service: General;  Laterality: N/A;   DILATION AND CURETTAGE OF UTERUS     ENDOSCOPIC RETROGRADE CHOLANGIOPANCREATOGRAPHY (ERCP) WITH PROPOFOL  N/A 12/28/2022   Procedure: ENDOSCOPIC RETROGRADE CHOLANGIOPANCREATOGRAPHY (ERCP) WITH PROPOFOL ;  Surgeon: Avram Lupita BRAVO, MD;  Location: [facility] ENDOSCOPY;  Service: Gastroenterology;  Laterality: N/A;   ENDOSCOPIC RETROGRADE CHOLANGIOPANCREATOGRAPHY (ERCP) WITH PROPOFOL  N/A 02/22/2023   Procedure: ENDOSCOPIC RETROGRADE CHOLANGIOPANCREATOGRAPHY (ERCP) WITH  PROPOFOL ;  Surgeon: Avram Lupita BRAVO, MD;  Location: [facility] ENDOSCOPY;  Service: Gastroenterology;  Laterality: N/A;   ESOPHAGOGASTRODUODENOSCOPY N/A 11/09/2022   Procedure: ESOPHAGOGASTRODUODENOSCOPY (EGD);  Surgeon: Wilhelmenia Aloha Raddle., MD;  Location: [facility];  Service: Gastroenterology;  Laterality: N/A;   EYE SURGERY  07/2020   GASTROINTESTINAL STENT REMOVAL N/A 02/22/2023   Procedure: GASTROINTESTINAL STENT REMOVAL;  Surgeon: Avram Lupita BRAVO, MD;  Location: [facility] ENDOSCOPY;  Service: Gastroenterology;  Laterality: N/A;   HAMMER TOE SURGERY     right hand surgery      due to blood infection    torn meniscus repair      right knee    TOTAL HIP ARTHROPLASTY  09/13/2012   Procedure: TOTAL HIP ARTHROPLASTY ANTERIOR APPROACH;  Surgeon: Donnice JONETTA Car, MD;  Location: [facility] ORS;  Service: Orthopedics;  Laterality: Right;   UPPER ESOPHAGEAL ENDOSCOPIC ULTRASOUND (EUS) N/A 11/09/2022   Procedure: UPPER ESOPHAGEAL ENDOSCOPIC ULTRASOUND (EUS);  Surgeon: Wilhelmenia Aloha Raddle., MD;  Location: [facility];  Service: Gastroenterology;  Laterality: N/A;    Allergies  Allergen Reactions   Other Shortness Of Breath    Blue fish: palms and feet turn red   Thorazine  [Chlorpromazine ] Other (See Comments)    Made her more more off balanced, vision   Bee Venom Swelling   Farxiga  [Dapagliflozin ] Swelling    Gi upset   Semaglutide      Rybelsus  - lip swelling, GI upset   Sulfa Antibiotics Hives    Current Outpatient Medications  Medication Sig Dispense Refill   acetaminophen  (TYLENOL ) 500 MG tablet Take 1 tablet (500 mg total) by mouth every 6 (six) hours as needed for mild pain or fever.     acyclovir  ointment (ZOVIRAX ) 5 % Apply 1 Application topically every 3 (three) hours. 15 g 5   allopurinol  (ZYLOPRIM ) 100 MG tablet TAKE 1 TABLET BY MOUTH TWICE A DAY 180 tablet  2   ALPRAZolam  (XANAX ) 0.25 MG tablet Take 1 tablet (0.25 mg total) by mouth 2 (two) times daily as needed. 60 tablet 5   augmented  betamethasone dipropionate (DIPROLENE-AF) 0.05 % cream Apply topically.     Azelastine  HCl 137 MCG/SPRAY SOLN INSTILL 2 SPRAYS IN EACH NOSTRIL TWICE DAILY 30 mL 12   Biotin 5000 MCG CAPS Take 5,000 mcg by mouth daily.     budesonide  (ENTOCORT EC ) 3 MG 24 hr capsule TAKE 3 CAPSULES DAILY 180 capsule 5   carvedilol  (COREG ) 6.25 MG tablet Take 1 tablet (6.25 mg total) by mouth 2 (two) times daily. Take 6.25 mg by mouth 2 (two) times daily. 180 tablet 2   cholecalciferol  (VITAMIN D3) 25 MCG (1000 UNIT) tablet Take 1,000 Units by mouth daily.     clobetasol cream (TEMOVATE) 0.05 % Apply 1 Application topically as needed.     colestipol  (COLESTID ) 1 g tablet Take 2 tablets (2 g total) by mouth 2 (two) times daily. 360 tablet 2   cyanocobalamin  (VITAMIN B12) 1000 MCG tablet Take 1,000 mcg by mouth daily.     cyclobenzaprine  (FLEXERIL ) 10 MG tablet TAKE 1 TABLET BY MOUTH EVERYDAY AT BEDTIME 90 tablet 1   folic acid  (FOLVITE ) 1 MG tablet Take 1 mg by mouth daily.     insulin  glargine (LANTUS  SOLOSTAR) 100 UNIT/ML Solostar Pen Inject 20 Units into the skin 2 (two) times daily. 36 mL 2   lisinopril -hydrochlorothiazide  (ZESTORETIC ) 20-25 MG tablet Take 1 tablet by mouth daily. 90 tablet 3   melatonin 5 MG TABS Take 5 mg by mouth at bedtime.     metFORMIN  (GLUCOPHAGE -XR) 500 MG 24 hr tablet TAKE 1 TABLET BY MOUTH TWICE A DAY 180 tablet 2   methotrexate  (RHEUMATREX) 2.5 MG tablet Take 17.5 mg by mouth every Sunday at 6pm.     metroNIDAZOLE  (METROCREAM ) 0.75 % cream SMARTSIG:sparingly Topical Twice Daily     mupirocin  ointment (BACTROBAN ) 2 % Apply 1 Application topically 2 (two) times daily. 30 g 2   omeprazole  (PRILOSEC) 40 MG capsule Take 1 capsule (40 mg total) by mouth 2 (two) times daily. 180 capsule 1   ondansetron  (ZOFRAN -ODT) 4 MG disintegrating tablet Take 1 tablet (4 mg total) by mouth every 8 (eight) hours as needed for nausea or vomiting. 20 tablet 0   oxyCODONE -acetaminophen  (PERCOCET) 10-325 MG  tablet Take 1 tablet by mouth 3 (three) times daily as needed.     polyethylene glycol (MIRALAX  / GLYCOLAX ) 17 g packet Take 17 g by mouth daily as needed for moderate constipation.     Probiotic Product (PROBIOTIC DAILY PO) Take 1 capsule by mouth daily.     rosuvastatin  (CRESTOR ) 5 MG tablet TAKE 1 TABLET BY MOUTH EVERY DAY 90 tablet 2   sertraline  (ZOLOFT ) 100 MG tablet TAKE 1.5 TABLETS (150MG  TOTAL) BY MOUTH DAILY 135 tablet 1   No current facility-administered medications for this visit.    Socially she is widowed. She has 2 children and 2 grandchildren. Her daughter is a GI physician. There is no tobacco use. She does drink occasional alcohol .  ROS General: Negative; No fevers, chills, or night sweats; 45 pound weight loss since October 2022 HEENT: Negative; No changes in vision or hearing, sinus congestion, difficulty swallowing Pulmonary: Negative; No cough, wheezing, shortness of breath, hemoptysis Cardiovascular: Negative; No chest pain, presyncope, syncope, palpitations; occasional feet swelling GI: History of lymphocytic colitis GU: Negative; No dysuria, hematuria, or difficulty voiding Musculoskeletal: Positive for R foot charcot joint;  recent right ankle fracture Hematologic/Oncology: Negative; no easy bruising, bleeding Rheumatologic: Psoriatic arthritis;  History of gout; Charcot joint Endocrine: Negative; no heat/cold intolerance; no diabetes Neuro: Negative; no changes in balance, headaches Skin: History of psoriatic arthritis Psychiatric: Negative; No behavioral problems, depression Sleep: Positive for complex sleep apnea on BiPAP therapy;  No residual snoring, daytime sleepiness, hypersomnolence, bruxism, restless legs, hypnogognic hallucinations, no cataplexy  An Epworth Sleepiness Scale was calculated in the office today, June 23, 2023 which endorsed at 5 argue against residual daytime sleepiness.  Other comprehensive 14 point system review is  negative.  PE BP 112/70   Pulse 82   Ht 5' 6 (1.676 m)   Wt 193 lb (87.5 kg)   SpO2 97%   BMI 31.15 kg/m    Repeat blood pressure by me was 120/70  Wt Readings from Last 3 Encounters:  01/25/24 193 lb (87.5 kg)  11/18/23 203 lb (92.1 kg)  11/05/23 203 lb (92.1 kg)   General: Alert, oriented, no distress.  Skin: normal turgor, no rashes, warm and dry HEENT: Normocephalic, atraumatic. Pupils equal round and reactive to light; sclera anicteric; extraocular muscles intact;  Nose without nasal septal hypertrophy Mouth/Parynx benign; Mallinpatti scale 3 Neck: No JVD, no carotid bruits; normal carotid upstroke Lungs: clear to ausculatation and percussion; no wheezing or rales Chest wall: without tenderness to palpitation Heart: PMI not displaced, RRR, s1 s2 normal, 1/6 systolic murmur, no diastolic murmur, no rubs, gallops, thrills, or heaves Abdomen: soft, nontender; no hepatosplenomehaly, BS+; abdominal aorta nontender and not dilated by palpation. Back: no CVA tenderness Pulses 2+ Musculoskeletal: full range of motion, normal strength, no joint deformities Extremities: no clubbing cyanosis or edema, Homan's sign negative  Neurologic: grossly nonfocal; Cranial nerves grossly wnl Psychologic: Normal mood and affect  EKG Interpretation Date/Time:  Tuesday January 25 2024 12:17:35 EDT Ventricular Rate:  82 PR Interval:  160 QRS Duration:  124 QT Interval:  362 QTC Calculation: 422 R Axis:   3  Text Interpretation: Normal sinus rhythm Right bundle branch block When compared with ECG of 23-Jun-2023 14:48, No significant change was found Confirmed by Burnard Ned ([id number]) on 01/28/2024 8:56:13 AM    June 23, 2023 ECG (independently read by me): Normal sinus rhythm at 62, right bundle branch block.   October 20, 2021 ECG (independently read by me):  Sinus rhythm at 77, PAC, IRBBB,     April 07, 2021 ECG (independently read by me): NSR at 75, RBBB, no ectopy  December 2019 ECG  (independently read by me): Sinus rhythm at 78 bpm.  Mild sinus arrhythmia.  Right bundle branch block with repolarization changes.  April 2019 ECG (independently read by me): Normal sinus rhythm at 83 bpm.  Right bundle branch block with repolarization changes.  QTc interval 4 6 0 ms.  September 2018 ECG (independently read by me): normal sinus rhythm at 79 bpm.  Right bundle-branch block.  QTc interval 481 ms.  June 2016 ECG (independently read by me):  Normal sinus rhythm with right bundle branch block.  October 2015 ECG (independently read by me): Normal sinus rhythm at 71 beats per minute right bundle branch block with repolarization changes.  Normal intervals.  Prior July 2014 ECG sinus rhythm with incomplete right bundle branch block. QRS duration 118 ms. Nonspecific ST-T changes.  LABS:    Latest Ref Rng & Units 09/20/2023   12:18 PM 07/02/2023   11:29 AM 01/04/2023    8:39 PM  BMP  Glucose 70 - 99  mg/dL 75  862  885   BUN 6 - 23 mg/dL 31  21  10    Creatinine 0.40 - 1.20 mg/dL 8.76  9.05  9.14   Sodium 135 - 145 mEq/L 136  133  135   Potassium 3.5 - 5.1 mEq/L 4.5  4.4  4.7   Chloride 96 - 112 mEq/L 100  98  100   CO2 19 - 32 mEq/L 27  30  26    Calcium  8.4 - 10.5 mg/dL 9.7  89.7  8.9       Latest Ref Rng & Units 09/20/2023   12:18 PM 07/02/2023   11:29 AM 01/04/2023    8:39 PM  Hepatic Function  Total Protein 6.0 - 8.3 g/dL 6.4  6.2  6.0   Albumin  3.5 - 5.2 g/dL 3.8  3.6  2.7   AST 0 - 37 U/L 21  28  21    ALT 0 - 35 U/L 14  27  12    Alk Phosphatase 39 - 117 U/L 74  81  68   Total Bilirubin 0.2 - 1.2 mg/dL 0.4  0.3  0.3       Latest Ref Rng & Units 09/20/2023   12:18 PM 07/02/2023   11:29 AM 01/04/2023    8:39 PM  CBC  WBC 4.0 - 10.5 K/uL 9.9  9.7  11.5   Hemoglobin 12.0 - 15.0 g/dL 89.1  88.7  89.8   Hematocrit 36.0 - 46.0 % 33.3  34.6  31.9   Platelets 150.0 - 400.0 K/uL 223.0  209.0  261    Lab Results  Component Value Date   MCV 92.9 09/20/2023   MCV 91.6  07/02/2023   MCV 94.4 01/04/2023   Lab Results  Component Value Date   TSH 4.60 07/02/2023    Lab Results  Component Value Date   HGBA1C 6.0 09/20/2023    Lipid Panel     Component Value Date/Time   CHOL 125 09/20/2023 1218   TRIG 110.0 09/20/2023 1218   HDL 48.60 09/20/2023 1218   CHOLHDL 3 09/20/2023 1218   VLDL 22.0 09/20/2023 1218   LDLCALC 54 09/20/2023 1218   LDLDIRECT 139.0 08/15/2019 1114      RADIOLOGY: No results found.  IMPRESSION:  1. OSA (obstructive sleep apnea) on BiPAP   2. Essential hypertension   3. Right bundle branch block   4. Hyperlipidemia with target LDL less than 55   5. Psoriatic arthritis (HCC) -Dr. Ishmael   6. Type 2 diabetes mellitus with complication, without long-term current use of insulin  (HCC)   7. Class 1 obesity due to excess calories with serious comorbidity and body mass index (BMI) of 31.0 to 31.9 in adult     ASSESSMENT AND PLAN: Ms. 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   She sees Dr. Jon Ishmael for rheumatology and had seen Dr. Donnald for her lymphocytic colitis.  She had broken her ankle remotely which had significantly limited her mobility and ability to walk and exercise.  She has had significant difficulty requiring a longstanding hospitalization with GI issues.  In addition she has been plagued by a long COVID syndrome and her initial protracted COVID infection from November 2020.  She had issues with wound care following an infection in her left foot.  He had another episode of COVID last year.  Remotely she  had a DreamStation auto BiPAP which was under recall.  She subsequently received a new ResMed AirCurve via auto unit with set up date on December 08, 2022.  [employer] is her DME company.  She continues to use BiPAP with 100% compliance with average use of 7 hours and 30 minutes.  However, AHI  continues to be significantly elevated.  Her unit had been set at a minimum EPAP of 10, pressure support of 5, maximum IPAP of 25.  However, she had a ramp time of 20 cm with ramp start EPAP at 5.  She typically had been setting her machine off at least 3 times per night and resuming it at a much lower pressure.  She was having some issues with different masks.  When last seen, I supplied her with 3 sample masks to see if this would improve her treatment.  Her most recent download of continues to show excellent usage at 100% with average use at 8 hours and 33 minutes.  However, her BiPAP settings apparently were not adjusted following her last office visit which undoubtedly may be contributing to her elevation of AI at 32.8.  Before she left the office today I make sure that her settings were adjusted and changed her ramp start EPAP pressure from 5 up to 7 and she will continue with her 5 cm pressure support such that when ramp initiates pressure will be 12/7.  Once her ramp time is completed I am changing her minimum EPAP pressure to 13 and she will continue with potential maximum IPAP pressure of 25.  Her current download shows no leak contributed by her new mask but unfortunately she has to significantly tighten her mask which may cause some discomfort.  Her blood pressure today is stable on her current therapy carvedilol  6.25 mg twice a day and lisinopril  HCT 20/25 mg twice a day.  She is diabetic on metformin  and insulin .  She is on methotrexate  for her psoriatic arthritis.  Currently she is on very low-dose rosuvastatin  at just 5 mg.  LDL cholesterol on September 20, 2023 was 54.  She continues to see Dr. Glade Hope for primary care.  She is aware of my imminent retirement.  I will try to obtain a download next week with her change of settings to see if there is improvement in her AHI.  She will need to change to a new sleep provider.  She remotely had seen Dr. Shlomo and does not want to see her.  I will  transition her sleep follow-up to the care of the pulmonary division to see Dr. Jude as well as his APP K. Cobb at the [place] office.  From a cardiology perspective, she has requested that I transition her to the care of Dr. Toney Decent.  It has been a pleasure to take care of her for these many years.    Debby RONAL Sor, MD, [facility], ABSM Diplomate, American Board of Sleep Medicine   01/28/2024 9:10 AM

## 2024-01-26 ENCOUNTER — Encounter (HOSPITAL_BASED_OUTPATIENT_CLINIC_OR_DEPARTMENT_OTHER): Payer: Self-pay

## 2024-01-28 ENCOUNTER — Encounter: Payer: Self-pay | Admitting: Cardiovascular Disease

## 2024-01-28 NOTE — Addendum Note (Signed)
 Addended by: Magnus Schuller A on: 01/28/2024 09:11 AM   Modules accepted: Level of Service

## 2024-01-28 NOTE — Addendum Note (Signed)
 Addended by: Maceo Sax on: 01/28/2024 10:05 AM   Modules accepted: Orders

## 2024-01-30 ENCOUNTER — Encounter: Payer: Self-pay | Admitting: Internal Medicine

## 2024-02-03 NOTE — Progress Notes (Signed)
 Subjective:    Patient ID: [name] [name], female    DOB: 09/12/1945, 78 y.o.   MRN: [id number]      HPI [name] [name] is here for  Chief Complaint  Patient presents with   Medical Management of Chronic Issues    Patient needs visit for electric wheelchair     Reason for Visit: Mobility Evaluation    A power wheel chair will assist the patient in the home with ADLs; including cooking, cleaning and toileting. She we will also use it outside the home because she lives in independent living and needs to walk a long distance for her meals.  A cane, walker or manual wheel chair will not assist patient efficiently due to obesity, chronic lower back pain, severe right shoulder arthritis and diabetic peripheral neuropathy. She is not able to walk long distances and a cane or walker will not aid with all ADLs.  She is not able to use a manual wheelchair due to severe right shoulder arthritis.  In addition, a power scooter will not be adequate due to the patients lack of ability to grip the tiller type steering of the equipment. Patient has the mental and physical capacity to follow and operate the electric devices safety instructions. Patient is willing to use in the home as well as outside of the home.          Medications and allergies reviewed with patient and updated if appropriate.  Current Outpatient Medications on File Prior to Visit  Medication Sig Dispense Refill   acetaminophen  (TYLENOL ) 500 MG tablet Take 1 tablet (500 mg total) by mouth every 6 (six) hours as needed for mild pain or fever.     acyclovir  ointment (ZOVIRAX ) 5 % Apply 1 Application topically every 3 (three) hours. 15 g 5   allopurinol  (ZYLOPRIM ) 100 MG tablet TAKE 1 TABLET BY MOUTH TWICE A DAY 180 tablet 2   ALPRAZolam  (XANAX ) 0.25 MG tablet Take 1 tablet (0.25 mg total) by mouth 2 (two) times daily as needed. 60 tablet 5   augmented betamethasone dipropionate (DIPROLENE-AF) 0.05 % cream Apply topically.      Azelastine  HCl 137 MCG/SPRAY SOLN INSTILL 2 SPRAYS IN EACH NOSTRIL TWICE DAILY 30 mL 12   Biotin 5000 MCG CAPS Take 5,000 mcg by mouth daily.     budesonide  (ENTOCORT EC ) 3 MG 24 hr capsule TAKE 3 CAPSULES DAILY 180 capsule 5   carvedilol  (COREG ) 6.25 MG tablet Take 1 tablet (6.25 mg total) by mouth 2 (two) times daily. Take 6.25 mg by mouth 2 (two) times daily. 180 tablet 2   cholecalciferol  (VITAMIN D3) 25 MCG (1000 UNIT) tablet Take 1,000 Units by mouth daily.     clobetasol cream (TEMOVATE) 0.05 % Apply 1 Application topically as needed.     colestipol  (COLESTID ) 1 g tablet Take 2 tablets (2 g total) by mouth 2 (two) times daily. 360 tablet 2   cyanocobalamin  (VITAMIN B12) 1000 MCG tablet Take 1,000 mcg by mouth daily.     cyclobenzaprine  (FLEXERIL ) 10 MG tablet TAKE 1 TABLET BY MOUTH EVERYDAY AT BEDTIME 90 tablet 1   folic acid  (FOLVITE ) 1 MG tablet Take 1 mg by mouth daily.     insulin  glargine (LANTUS  SOLOSTAR) 100 UNIT/ML Solostar Pen Inject 20 Units into the skin 2 (two) times daily. 36 mL 2   lisinopril -hydrochlorothiazide  (ZESTORETIC ) 20-25 MG tablet Take 1 tablet by mouth daily. 90 tablet 3   melatonin 5 MG TABS Take 5 mg by  mouth at bedtime.     metFORMIN  (GLUCOPHAGE -XR) 500 MG 24 hr tablet TAKE 1 TABLET BY MOUTH TWICE A DAY 180 tablet 2   methotrexate  (RHEUMATREX) 2.5 MG tablet Take 17.5 mg by mouth every Sunday at 6pm.     metroNIDAZOLE  (METROCREAM ) 0.75 % cream SMARTSIG:sparingly Topical Twice Daily     mupirocin  ointment (BACTROBAN ) 2 % Apply 1 Application topically 2 (two) times daily. 30 g 2   omeprazole  (PRILOSEC) 40 MG capsule Take 1 capsule (40 mg total) by mouth 2 (two) times daily. 180 capsule 1   ondansetron  (ZOFRAN -ODT) 4 MG disintegrating tablet Take 1 tablet (4 mg total) by mouth every 8 (eight) hours as needed for nausea or vomiting. 20 tablet 0   oxyCODONE -acetaminophen  (PERCOCET) 10-325 MG tablet Take 1 tablet by mouth 3 (three) times daily as needed.      polyethylene glycol (MIRALAX  / GLYCOLAX ) 17 g packet Take 17 g by mouth daily as needed for moderate constipation.     Probiotic Product (PROBIOTIC DAILY PO) Take 1 capsule by mouth daily.     rosuvastatin  (CRESTOR ) 5 MG tablet TAKE 1 TABLET BY MOUTH EVERY DAY 90 tablet 2   sertraline  (ZOLOFT ) 100 MG tablet TAKE 1.5 TABLETS (150MG  TOTAL) BY MOUTH DAILY 135 tablet 1   No current facility-administered medications on file prior to visit.    Review of Systems     Objective:   Vitals:   02/04/24 1517  BP: 120/64  Pulse: 78  Temp: 98 F (36.7 C)  SpO2: 98%   BP Readings from Last 3 Encounters:  02/04/24 120/64  01/25/24 112/70  01/12/24 120/70   Wt Readings from Last 3 Encounters:  01/25/24 193 lb (87.5 kg)  11/18/23 203 lb (92.1 kg)  11/05/23 203 lb (92.1 kg)   Body mass index is 31.15 kg/m.    Physical Exam Constitutional:      General: She is not in acute distress.    Appearance: Normal appearance.  HENT:     Head: Normocephalic and atraumatic.   Eyes:     Conjunctiva/sclera: Conjunctivae normal.    Cardiovascular:     Rate and Rhythm: Normal rate and regular rhythm.     Heart sounds: Normal heart sounds.  Pulmonary:     Effort: Pulmonary effort is normal. No respiratory distress.     Breath sounds: Normal breath sounds. No wheezing.   Musculoskeletal:     Cervical back: Neck supple.     Right lower leg: No edema.     Left lower leg: No edema.  Lymphadenopathy:     Cervical: No cervical adenopathy.   Skin:    General: Skin is warm and dry.     Findings: No rash.   Neurological:     Mental Status: She is alert. Mental status is at baseline.     Comments: Bilateral LE strength 5/5,   Right proximal UE 4/5,  Left proximal UE 5/5, b/l hand 4/5  Psychiatric:        Mood and Affect: Mood normal.        Behavior: Behavior normal.            Assessment & Plan:    See Problem List for Assessment and Plan of chronic medical problems.

## 2024-02-04 ENCOUNTER — Encounter: Payer: Self-pay | Admitting: Internal Medicine

## 2024-02-04 ENCOUNTER — Ambulatory Visit (INDEPENDENT_AMBULATORY_CARE_PROVIDER_SITE_OTHER): Admitting: Internal Medicine

## 2024-02-04 VITALS — BP 120/64 | HR 78 | Temp 98.0°F | Ht 66.0 in

## 2024-02-04 DIAGNOSIS — G8929 Other chronic pain: Secondary | ICD-10-CM

## 2024-02-04 DIAGNOSIS — I10 Essential (primary) hypertension: Secondary | ICD-10-CM | POA: Diagnosis not present

## 2024-02-04 DIAGNOSIS — M545 Low back pain, unspecified: Secondary | ICD-10-CM

## 2024-02-04 DIAGNOSIS — Z7984 Long term (current) use of oral hypoglycemic drugs: Secondary | ICD-10-CM | POA: Diagnosis not present

## 2024-02-04 DIAGNOSIS — E1142 Type 2 diabetes mellitus with diabetic polyneuropathy: Secondary | ICD-10-CM

## 2024-02-04 DIAGNOSIS — Z6833 Body mass index (BMI) 33.0-33.9, adult: Secondary | ICD-10-CM | POA: Diagnosis not present

## 2024-02-04 DIAGNOSIS — E66811 Obesity, class 1: Secondary | ICD-10-CM | POA: Diagnosis not present

## 2024-02-04 DIAGNOSIS — M19011 Primary osteoarthritis, right shoulder: Secondary | ICD-10-CM | POA: Diagnosis not present

## 2024-02-04 DIAGNOSIS — E6609 Other obesity due to excess calories: Secondary | ICD-10-CM

## 2024-02-04 NOTE — Patient Instructions (Signed)
   We will order the electric wheelchair

## 2024-02-04 NOTE — Assessment & Plan Note (Signed)
 Chronic Following with podiatry regularly This does affect her balance and ability to walk long distances

## 2024-02-04 NOTE — Assessment & Plan Note (Signed)
Chronic Blood pressure well controlled Continue carvedilol 6.25 mg twice daily, lisinopril 20 mg daily

## 2024-02-04 NOTE — Assessment & Plan Note (Addendum)
 Chronic Follows with orthopedics and on chronic pain medication which helps some, but not much She has done physical therapy Mobility is limited In order to complete her ADLs it is medically necessary that she gets an electric wheelchair due to her chronic back pain

## 2024-02-04 NOTE — Assessment & Plan Note (Signed)
 Chronic Has seen orthopedics-only treatment would be a total shoulder replacement, which she does not want to have Not able to use manual wheelchair In order to complete her ADLs it is medically necessary that she gets an electric wheelchair due to her chronic back pain

## 2024-02-04 NOTE — Assessment & Plan Note (Signed)
 Chronic Working on weight loss Mobility is very limited.

## 2024-02-10 DIAGNOSIS — Z79899 Other long term (current) drug therapy: Secondary | ICD-10-CM | POA: Diagnosis not present

## 2024-02-10 DIAGNOSIS — R5383 Other fatigue: Secondary | ICD-10-CM | POA: Diagnosis not present

## 2024-02-10 DIAGNOSIS — L4059 Other psoriatic arthropathy: Secondary | ICD-10-CM | POA: Diagnosis not present

## 2024-02-18 ENCOUNTER — Other Ambulatory Visit: Payer: Self-pay

## 2024-02-18 DIAGNOSIS — M6281 Muscle weakness (generalized): Secondary | ICD-10-CM | POA: Diagnosis not present

## 2024-02-18 DIAGNOSIS — R293 Abnormal posture: Secondary | ICD-10-CM | POA: Diagnosis not present

## 2024-02-18 DIAGNOSIS — M5459 Other low back pain: Secondary | ICD-10-CM | POA: Diagnosis not present

## 2024-02-18 DIAGNOSIS — M25511 Pain in right shoulder: Secondary | ICD-10-CM | POA: Diagnosis not present

## 2024-02-18 MED ORDER — METFORMIN HCL ER 500 MG PO TB24: 500.0000 mg | ORAL_TABLET | Freq: Two times a day (BID) | ORAL | 2 refills | Status: AC

## 2024-02-22 DIAGNOSIS — R293 Abnormal posture: Secondary | ICD-10-CM | POA: Diagnosis not present

## 2024-02-22 DIAGNOSIS — M25511 Pain in right shoulder: Secondary | ICD-10-CM | POA: Diagnosis not present

## 2024-02-22 DIAGNOSIS — M5459 Other low back pain: Secondary | ICD-10-CM | POA: Diagnosis not present

## 2024-02-22 DIAGNOSIS — M6281 Muscle weakness (generalized): Secondary | ICD-10-CM | POA: Diagnosis not present

## 2024-02-24 ENCOUNTER — Other Ambulatory Visit: Payer: Self-pay | Admitting: Internal Medicine

## 2024-02-27 ENCOUNTER — Encounter: Payer: Self-pay | Admitting: Internal Medicine

## 2024-02-27 DIAGNOSIS — R32 Unspecified urinary incontinence: Secondary | ICD-10-CM

## 2024-03-01 DIAGNOSIS — M6281 Muscle weakness (generalized): Secondary | ICD-10-CM | POA: Diagnosis not present

## 2024-03-01 DIAGNOSIS — R293 Abnormal posture: Secondary | ICD-10-CM | POA: Diagnosis not present

## 2024-03-01 DIAGNOSIS — M25511 Pain in right shoulder: Secondary | ICD-10-CM | POA: Diagnosis not present

## 2024-03-01 DIAGNOSIS — M5459 Other low back pain: Secondary | ICD-10-CM | POA: Diagnosis not present

## 2024-03-02 DIAGNOSIS — R293 Abnormal posture: Secondary | ICD-10-CM | POA: Diagnosis not present

## 2024-03-02 DIAGNOSIS — M25511 Pain in right shoulder: Secondary | ICD-10-CM | POA: Diagnosis not present

## 2024-03-02 DIAGNOSIS — M5459 Other low back pain: Secondary | ICD-10-CM | POA: Diagnosis not present

## 2024-03-02 DIAGNOSIS — M6281 Muscle weakness (generalized): Secondary | ICD-10-CM | POA: Diagnosis not present

## 2024-03-08 ENCOUNTER — Ambulatory Visit (HOSPITAL_BASED_OUTPATIENT_CLINIC_OR_DEPARTMENT_OTHER): Admitting: Nurse Practitioner

## 2024-03-09 DIAGNOSIS — M5416 Radiculopathy, lumbar region: Secondary | ICD-10-CM | POA: Diagnosis not present

## 2024-03-10 DIAGNOSIS — M5459 Other low back pain: Secondary | ICD-10-CM | POA: Diagnosis not present

## 2024-03-10 DIAGNOSIS — M6281 Muscle weakness (generalized): Secondary | ICD-10-CM | POA: Diagnosis not present

## 2024-03-10 DIAGNOSIS — M25511 Pain in right shoulder: Secondary | ICD-10-CM | POA: Diagnosis not present

## 2024-03-10 DIAGNOSIS — R293 Abnormal posture: Secondary | ICD-10-CM | POA: Diagnosis not present

## 2024-03-15 ENCOUNTER — Encounter: Payer: Self-pay | Admitting: Internal Medicine

## 2024-03-15 DIAGNOSIS — R293 Abnormal posture: Secondary | ICD-10-CM | POA: Diagnosis not present

## 2024-03-15 DIAGNOSIS — M6281 Muscle weakness (generalized): Secondary | ICD-10-CM | POA: Diagnosis not present

## 2024-03-15 DIAGNOSIS — M5459 Other low back pain: Secondary | ICD-10-CM | POA: Diagnosis not present

## 2024-03-15 DIAGNOSIS — M25511 Pain in right shoulder: Secondary | ICD-10-CM | POA: Diagnosis not present

## 2024-03-16 DIAGNOSIS — L4059 Other psoriatic arthropathy: Secondary | ICD-10-CM | POA: Diagnosis not present

## 2024-03-19 ENCOUNTER — Encounter: Payer: Self-pay | Admitting: Internal Medicine

## 2024-03-19 DIAGNOSIS — D649 Anemia, unspecified: Secondary | ICD-10-CM | POA: Insufficient documentation

## 2024-03-19 NOTE — Progress Notes (Unsigned)
 Subjective:    Patient ID: [name] [name], female    DOB: [birth year], 78 y.o.   MRN: [id number]     HPI [name] [name] is here for follow up of her chronic medical problems.  Slight ckd, anemia  Medications and allergies reviewed with patient and updated if appropriate.  Current Outpatient Medications on File Prior to Visit  Medication Sig Dispense Refill   acetaminophen  (TYLENOL ) 500 MG tablet Take 1 tablet (500 mg total) by mouth every 6 (six) hours as needed for mild pain or fever.     acyclovir  ointment (ZOVIRAX ) 5 % Apply 1 Application topically every 3 (three) hours. 15 g 5   allopurinol  (ZYLOPRIM ) 100 MG tablet TAKE 1 TABLET BY MOUTH TWICE A DAY 180 tablet 2   ALPRAZolam  (XANAX ) 0.25 MG tablet Take 1 tablet (0.25 mg total) by mouth 2 (two) times daily as needed. 60 tablet 5   augmented betamethasone dipropionate (DIPROLENE-AF) 0.05 % cream Apply topically.     Azelastine  HCl 137 MCG/SPRAY SOLN INSTILL 2 SPRAYS IN EACH NOSTRIL TWICE DAILY 30 mL 12   Biotin 5000 MCG CAPS Take 5,000 mcg by mouth daily.     budesonide  (ENTOCORT EC ) 3 MG 24 hr capsule TAKE 3 CAPSULES DAILY 180 capsule 5   carvedilol  (COREG ) 6.25 MG tablet Take 1 tablet (6.25 mg total) by mouth 2 (two) times daily. Take 6.25 mg by mouth 2 (two) times daily. 180 tablet 2   cholecalciferol  (VITAMIN D3) 25 MCG (1000 UNIT) tablet Take 1,000 Units by mouth daily.     clobetasol cream (TEMOVATE) 0.05 % Apply 1 Application topically as needed.     colestipol  (COLESTID ) 1 g tablet Take 2 tablets (2 g total) by mouth 2 (two) times daily. 360 tablet 2   cyanocobalamin  (VITAMIN B12) 1000 MCG tablet Take 1,000 mcg by mouth daily.     cyclobenzaprine  (FLEXERIL ) 10 MG tablet TAKE 1 TABLET BY MOUTH EVERYDAY AT BEDTIME 90 tablet 1   folic acid  (FOLVITE ) 1 MG tablet Take 1 mg by mouth daily.     insulin  glargine (LANTUS  SOLOSTAR) 100 UNIT/ML Solostar Pen Inject 20 Units into the skin 2 (two) times daily. 36 mL 2    lisinopril -hydrochlorothiazide  (ZESTORETIC ) 20-25 MG tablet Take 1 tablet by mouth daily. 90 tablet 3   melatonin 5 MG TABS Take 5 mg by mouth at bedtime.     metFORMIN  (GLUCOPHAGE -XR) 500 MG 24 hr tablet Take 1 tablet (500 mg total) by mouth 2 (two) times daily. 180 tablet 2   methotrexate  (RHEUMATREX) 2.5 MG tablet Take 17.5 mg by mouth every Sunday at 6pm.     metroNIDAZOLE  (METROCREAM ) 0.75 % cream SMARTSIG:sparingly Topical Twice Daily     mupirocin  ointment (BACTROBAN ) 2 % Apply 1 Application topically 2 (two) times daily. 30 g 2   omeprazole  (PRILOSEC) 40 MG capsule Take 1 capsule (40 mg total) by mouth 2 (two) times daily. 180 capsule 1   ondansetron  (ZOFRAN -ODT) 4 MG disintegrating tablet DISSOLVE 1 TABLET BY MOUTH EVERY 8 HOURS AS NEEDED FOR NAUSEA AND VOMITING 20 tablet 2   oxyCODONE -acetaminophen  (PERCOCET) 10-325 MG tablet Take 1 tablet by mouth 3 (three) times daily as needed.     polyethylene glycol (MIRALAX  / GLYCOLAX ) 17 g packet Take 17 g by mouth daily as needed for moderate constipation.     Probiotic Product (PROBIOTIC DAILY PO) Take 1 capsule by mouth daily.     rosuvastatin  (CRESTOR ) 5 MG tablet TAKE 1 TABLET BY  MOUTH EVERY DAY 90 tablet 2   sertraline  (ZOLOFT ) 100 MG tablet TAKE 1.5 TABLETS (150MG  TOTAL) BY MOUTH DAILY 135 tablet 1   No current facility-administered medications on file prior to visit.     Review of Systems     Objective:  There were no vitals filed for this visit. BP Readings from Last 3 Encounters:  02/04/24 120/64  01/25/24 112/70  01/12/24 120/70   Wt Readings from Last 3 Encounters:  01/25/24 193 lb (87.5 kg)  11/18/23 203 lb (92.1 kg)  11/05/23 203 lb (92.1 kg)   There is no height or weight on file to calculate BMI.    Physical Exam     Lab Results  Component Value Date   WBC 9.9 09/20/2023   HGB 10.8 (L) 09/20/2023   HCT 33.3 (L) 09/20/2023   PLT 223.0 09/20/2023   GLUCOSE 75 09/20/2023   CHOL 125 09/20/2023   TRIG 110.0  09/20/2023   HDL 48.60 09/20/2023   LDLDIRECT 139.0 08/15/2019   LDLCALC 54 09/20/2023   ALT 14 09/20/2023   AST 21 09/20/2023   NA 136 09/20/2023   K 4.5 09/20/2023   CL 100 09/20/2023   CREATININE 1.23 (H) 09/20/2023   BUN 31 (H) 09/20/2023   CO2 27 09/20/2023   TSH 4.60 07/02/2023   INR 1.4 (H) 12/27/2022   HGBA1C 6.0 09/20/2023   MICROALBUR <0.7 09/20/2023     Assessment & Plan:    See Problem List for Assessment and Plan of chronic medical problems.

## 2024-03-19 NOTE — Patient Instructions (Addendum)
      Blood work was ordered.       Medications changes include :   None    A referral was ordered and someone will call you to schedule an appointment.     Return in about 6 months (around 09/20/2024) for follow up.

## 2024-03-20 ENCOUNTER — Ambulatory Visit (INDEPENDENT_AMBULATORY_CARE_PROVIDER_SITE_OTHER): Payer: Medicare Other | Admitting: Internal Medicine

## 2024-03-20 ENCOUNTER — Other Ambulatory Visit: Payer: Self-pay | Admitting: Internal Medicine

## 2024-03-20 VITALS — BP 114/76 | HR 72 | Temp 98.0°F | Ht 66.0 in | Wt 210.0 lb

## 2024-03-20 DIAGNOSIS — M109 Gout, unspecified: Secondary | ICD-10-CM | POA: Diagnosis not present

## 2024-03-20 DIAGNOSIS — E1142 Type 2 diabetes mellitus with diabetic polyneuropathy: Secondary | ICD-10-CM | POA: Diagnosis not present

## 2024-03-20 DIAGNOSIS — K219 Gastro-esophageal reflux disease without esophagitis: Secondary | ICD-10-CM | POA: Diagnosis not present

## 2024-03-20 DIAGNOSIS — L4050 Arthropathic psoriasis, unspecified: Secondary | ICD-10-CM | POA: Diagnosis not present

## 2024-03-20 DIAGNOSIS — I10 Essential (primary) hypertension: Secondary | ICD-10-CM

## 2024-03-20 DIAGNOSIS — F3289 Other specified depressive episodes: Secondary | ICD-10-CM

## 2024-03-20 DIAGNOSIS — M6281 Muscle weakness (generalized): Secondary | ICD-10-CM | POA: Diagnosis not present

## 2024-03-20 DIAGNOSIS — M25511 Pain in right shoulder: Secondary | ICD-10-CM | POA: Diagnosis not present

## 2024-03-20 DIAGNOSIS — R293 Abnormal posture: Secondary | ICD-10-CM | POA: Diagnosis not present

## 2024-03-20 DIAGNOSIS — E785 Hyperlipidemia, unspecified: Secondary | ICD-10-CM | POA: Diagnosis not present

## 2024-03-20 DIAGNOSIS — F419 Anxiety disorder, unspecified: Secondary | ICD-10-CM

## 2024-03-20 DIAGNOSIS — M5459 Other low back pain: Secondary | ICD-10-CM | POA: Diagnosis not present

## 2024-03-20 DIAGNOSIS — D649 Anemia, unspecified: Secondary | ICD-10-CM | POA: Diagnosis not present

## 2024-03-20 DIAGNOSIS — E6609 Other obesity due to excess calories: Secondary | ICD-10-CM

## 2024-03-20 DIAGNOSIS — Z6833 Body mass index (BMI) 33.0-33.9, adult: Secondary | ICD-10-CM

## 2024-03-20 LAB — COMPREHENSIVE METABOLIC PANEL WITH GFR
ALT: 13 U/L (ref 0–35)
AST: 18 U/L (ref 0–37)
Albumin: 3.9 g/dL (ref 3.5–5.2)
Alkaline Phosphatase: 75 U/L (ref 39–117)
BUN: 16 mg/dL (ref 6–23)
CO2: 29 meq/L (ref 19–32)
Calcium: 9.7 mg/dL (ref 8.4–10.5)
Chloride: 97 meq/L (ref 96–112)
Creatinine, Ser: 0.94 mg/dL (ref 0.40–1.20)
GFR: 58.06 mL/min — ABNORMAL LOW (ref >60.00)
Glucose, Bld: 87 mg/dL (ref 70–99)
Potassium: 5.1 meq/L (ref 3.5–5.1)
Sodium: 134 meq/L — ABNORMAL LOW (ref 135–145)
Total Bilirubin: 0.3 mg/dL (ref 0.2–1.2)
Total Protein: 6.3 g/dL (ref 6.0–8.3)

## 2024-03-20 LAB — LIPID PANEL
Cholesterol: 134 mg/dL (ref 0–200)
HDL: 52.8 mg/dL (ref >39.00)
LDL Cholesterol: 52 mg/dL (ref 0–99)
NonHDL: 81.68
Total CHOL/HDL Ratio: 3
Triglycerides: 150 mg/dL — ABNORMAL HIGH (ref 0.0–149.0)
VLDL: 30 mg/dL (ref 0.0–40.0)

## 2024-03-20 LAB — CBC WITH DIFFERENTIAL/PLATELET
Basophils Absolute: 0 K/uL (ref 0.0–0.1)
Basophils Relative: 0.5 % (ref 0.0–3.0)
Eosinophils Absolute: 0.1 K/uL (ref 0.0–0.7)
Eosinophils Relative: 1.3 % (ref 0.0–5.0)
HCT: 33.6 % — ABNORMAL LOW (ref 36.0–46.0)
Hemoglobin: 10.8 g/dL — ABNORMAL LOW (ref 12.0–15.0)
Lymphocytes Relative: 22.5 % (ref 12.0–46.0)
Lymphs Abs: 2.3 K/uL (ref 0.7–4.0)
MCHC: 32.1 g/dL (ref 30.0–36.0)
MCV: 93 fl (ref 78.0–100.0)
Monocytes Absolute: 0.8 K/uL (ref 0.1–1.0)
Monocytes Relative: 7.4 % (ref 3.0–12.0)
Neutro Abs: 7.1 K/uL (ref 1.4–7.7)
Neutrophils Relative %: 68.3 % (ref 43.0–77.0)
Platelets: 207 K/uL (ref 150.0–400.0)
RBC: 3.61 Mil/uL — ABNORMAL LOW (ref 3.87–5.11)
RDW: 20.8 % — ABNORMAL HIGH (ref 11.5–15.5)
WBC: 10.3 K/uL (ref 4.0–10.5)

## 2024-03-20 LAB — FOLATE: Folate: >23.4 ng/mL (ref >5.9)

## 2024-03-20 LAB — IBC PANEL
Iron: 37 ug/dL — ABNORMAL LOW (ref 42–145)
Saturation Ratios: 9 % — ABNORMAL LOW (ref 20.0–50.0)
TIBC: 410.2 ug/dL (ref 250.0–450.0)
Transferrin: 293 mg/dL (ref 212.0–360.0)

## 2024-03-20 LAB — FERRITIN: Ferritin: 13.2 ng/mL (ref 10.0–291.0)

## 2024-03-20 LAB — VITAMIN B12: Vitamin B-12: 941 pg/mL — ABNORMAL HIGH (ref 211–911)

## 2024-03-20 LAB — HEMOGLOBIN A1C: Hgb A1c MFr Bld: 6.2 % (ref 4.6–6.5)

## 2024-03-20 MED ORDER — LISINOPRIL-HYDROCHLOROTHIAZIDE 20-12.5 MG PO TABS: 1.0000 | ORAL_TABLET | Freq: Every day | ORAL | 3 refills | Status: DC

## 2024-03-20 NOTE — Assessment & Plan Note (Addendum)
 Chronic Management per rheumatology Methotrexate  dose lowered recently On Cimzia 

## 2024-03-20 NOTE — Assessment & Plan Note (Signed)
Chronic Regular exercise and healthy diet encouraged Check lipid panel  Continue Crestor 5 mg daily 

## 2024-03-20 NOTE — Assessment & Plan Note (Signed)
 Chronic  Lab Results  Component Value Date   HGBA1C 6.0 09/20/2023   Sugars controlled Testing sugars 1 times a day-well-controlled Check A1c Continue metformin  XR 500 mg twice daily, Lantus  20 units twice daily Stressed regular exercise, diabetic diet

## 2024-03-20 NOTE — Assessment & Plan Note (Signed)
Chronic Controlled, Stable Continue allopurinol 100 mg twice daily

## 2024-03-20 NOTE — Assessment & Plan Note (Signed)
 Chronic Currently stable Check CBC, iron panel, B12 level, folate level

## 2024-03-20 NOTE — Assessment & Plan Note (Addendum)
 Chronic Not ideally controlled, but better than it was Continue sertraline  to 150 mg daily

## 2024-03-20 NOTE — Assessment & Plan Note (Signed)
 Chronic Blood pressure well controlled Cmp, cbc Continue carvedilol  6.25 mg twice daily, lisinopril -hctz 20-25 mg daily

## 2024-03-20 NOTE — Assessment & Plan Note (Signed)
Chronic GERD controlled Continue omeprazole 40 mg twice daily

## 2024-03-20 NOTE — Assessment & Plan Note (Signed)
 Chronic Following with podiatry regularly This does affect her balance and ability to walk long distances

## 2024-03-22 ENCOUNTER — Ambulatory Visit (HOSPITAL_BASED_OUTPATIENT_CLINIC_OR_DEPARTMENT_OTHER): Admitting: Nurse Practitioner

## 2024-03-23 ENCOUNTER — Ambulatory Visit (INDEPENDENT_AMBULATORY_CARE_PROVIDER_SITE_OTHER): Admitting: Podiatry

## 2024-03-23 ENCOUNTER — Ambulatory Visit: Payer: Self-pay | Admitting: Internal Medicine

## 2024-03-23 DIAGNOSIS — E1142 Type 2 diabetes mellitus with diabetic polyneuropathy: Secondary | ICD-10-CM

## 2024-03-23 DIAGNOSIS — M79674 Pain in right toe(s): Secondary | ICD-10-CM

## 2024-03-23 DIAGNOSIS — B351 Tinea unguium: Secondary | ICD-10-CM | POA: Diagnosis not present

## 2024-03-23 DIAGNOSIS — M14672 Charcot's joint, left ankle and foot: Secondary | ICD-10-CM

## 2024-03-23 DIAGNOSIS — M79675 Pain in left toe(s): Secondary | ICD-10-CM

## 2024-03-23 NOTE — Progress Notes (Signed)
This patient returns to my office for at risk foot care.  This patient requires this care by a professional since this patient will be at risk due to having peripheral neuropathy. This patient is unable to cut nails herself since the patient cannot reach her nails.These nails are painful walking and wearing shoes.  This patient presents for at risk foot care today. Previous foot surgery for Charcot foot by Liz Malady. ? ?General Appearance  Alert, conversant and in no acute stress. ? ?Vascular  Dorsalis pedis and posterior tibial  pulses are weakly  palpable  bilaterally.  Capillary return is within normal limits  bilaterally. Cold feet.  bilaterally. ? ?Neurologic  Senn-Weinstein monofilament wire test diminished  bilaterally. Muscle power within normal limits bilaterally. ? ?Nails Thick disfigured discolored nails with subungual debris  from hallux to fifth toes bilaterally. No evidence of bacterial infection or drainage bilaterally. ? ?Orthopedic  No limitations of motion  feet .  No crepitus or effusions noted.  No bony pathology or digital deformities noted. Charcot foot left foot. ? ?Skin  normotropic skin with no porokeratosis noted bilaterally.  No signs of infections or ulcers noted.    ? ?Onychomycosis  Pain in right toes  Pain in left toes ? ?Consent was obtained for treatment procedures.   Mechanical debridement of nails 1-5  bilaterally performed with a nail nipper.  Filed with dremel without incident.  ? ? ?Return office visit    3 months                  Told patient to return for periodic foot care and evaluation due to potential at risk complications. ? ? ?Gardiner Barefoot DPM   ?

## 2024-03-24 DIAGNOSIS — M5459 Other low back pain: Secondary | ICD-10-CM | POA: Diagnosis not present

## 2024-03-24 DIAGNOSIS — M6281 Muscle weakness (generalized): Secondary | ICD-10-CM | POA: Diagnosis not present

## 2024-03-24 DIAGNOSIS — R293 Abnormal posture: Secondary | ICD-10-CM | POA: Diagnosis not present

## 2024-03-24 DIAGNOSIS — M25511 Pain in right shoulder: Secondary | ICD-10-CM | POA: Diagnosis not present

## 2024-03-25 ENCOUNTER — Other Ambulatory Visit: Payer: Self-pay | Admitting: Internal Medicine

## 2024-04-04 ENCOUNTER — Encounter: Payer: Self-pay | Admitting: Internal Medicine

## 2024-04-05 ENCOUNTER — Ambulatory Visit (HOSPITAL_BASED_OUTPATIENT_CLINIC_OR_DEPARTMENT_OTHER): Admitting: Nurse Practitioner

## 2024-04-13 ENCOUNTER — Encounter (HOSPITAL_BASED_OUTPATIENT_CLINIC_OR_DEPARTMENT_OTHER): Payer: Self-pay | Admitting: Pulmonary Disease

## 2024-04-13 ENCOUNTER — Ambulatory Visit (HOSPITAL_BASED_OUTPATIENT_CLINIC_OR_DEPARTMENT_OTHER): Admitting: Pulmonary Disease

## 2024-04-13 VITALS — BP 132/71 | HR 97 | Ht 66.0 in | Wt 204.0 lb

## 2024-04-13 DIAGNOSIS — G4733 Obstructive sleep apnea (adult) (pediatric): Secondary | ICD-10-CM | POA: Diagnosis not present

## 2024-04-13 DIAGNOSIS — F419 Anxiety disorder, unspecified: Secondary | ICD-10-CM

## 2024-04-13 NOTE — Progress Notes (Signed)
 New Patient Pulmonology Office Visit   Subjective:  Patient ID: [name] [name], female    DOB: 06/01/1946  MRN: [id number]  Referred by: Burnard Debby LABOR, MD  CC: No chief complaint on file.  Establish care for OSA     Discussed the use of AI scribe software for clinical note transcription with the patient, who gave verbal consent to proceed.  History of Present Illness   Discussed the use of AI scribe software for clinical note transcription with the patient, who gave verbal consent to proceed.  History of Present Illness   [name] [name] is a 78 year old female with obstructive sleep apnea who presents for management of her condition. She was previously under the care of Dr. Burnard from cardiology.  She uses a BiPAP machine for obstructive sleep apnea, with a new device obtained last year. Despite adjustments, she experiences multiple apnea events at night. Current settings were adjusted in June, increasing the starting pressure to 7 cm H2O, which has provided some improvement. However, she experiences high pressure during the night, causing discomfort and leading her to turn off the machine temporarily.  She goes to bed between 10:00 and 10:30 PM and wakes up around 7:30 AM, with one to two awakenings during the night. She sleeps on her back with one pillow and does not experience dry mouth or headaches upon waking. Despite these measures, she does not feel as rested as before and notes decreased energy levels over the past couple of years.  She feels anxious, affecting her sleep quality. She was prescribed Xanax  but stopped taking it due to concerns about memory effects.      ResMed air curve 10 via auto BiPAP unit 11/2022 Persistent high AHI on downloads, reviewed Dr Joesphine notes , last seen 01/2024 DME - [facility]     PMH : DM-2 hypertension,   psoriatic arthritis, on Humira & methotrexate   DVT, which occurred while traveling to [place]  Charcot joint - right foot.  long  COVID resulting from her COVID infection in November 2020    Significant tests/ events reviewed  BiPAP titration o 10/2022. BiPAP was initiated 8/4 and titrated to maximum optimal pressure 22/18. AHI was 0, RDI 6.6 and O2 nadir at 93%. She did not achieve any REM sleep   ROS  Constitutional: negative for anorexia, fevers and sweats  Eyes: negative for irritation, redness and visual disturbance  Ears, nose, mouth, throat, and face: negative for earaches, epistaxis, nasal congestion and sore throat  Respiratory: negative for cough, dyspnea on exertion, sputum and wheezing  Cardiovascular: negative for chest pain, dyspnea, lower extremity edema, orthopnea, palpitations and syncope  Gastrointestinal: negative for abdominal pain, constipation, diarrhea, melena, nausea and vomiting  Genitourinary:negative for dysuria, frequency and hematuria  Hematologic/lymphatic: negative for bleeding, easy bruising and lymphadenopathy  Musculoskeletal:negative for arthralgias, muscle weakness and stiff joints  Neurological: negative for coordination problems, gait problems, headaches and weakness  Endocrine: negative for diabetic symptoms including polydipsia, polyuria and weight loss   Allergies: Other, Thorazine  [chlorpromazine ], Bee venom, Farxiga  [dapagliflozin ], Semaglutide , and Sulfa antibiotics  Current Outpatient Medications:    acetaminophen  (TYLENOL ) 500 MG tablet, Take 1 tablet (500 mg total) by mouth every 6 (six) hours as needed for mild pain or fever., Disp: , Rfl:    acyclovir  ointment (ZOVIRAX ) 5 %, Apply 1 Application topically every 3 (three) hours., Disp: 15 g, Rfl: 5   allopurinol  (ZYLOPRIM ) 100 MG tablet, TAKE 1 TABLET BY MOUTH TWICE A DAY, Disp:  180 tablet, Rfl: 2   ALPRAZolam  (XANAX ) 0.25 MG tablet, Take 1 tablet (0.25 mg total) by mouth 2 (two) times daily as needed., Disp: 60 tablet, Rfl: 5   augmented betamethasone dipropionate (DIPROLENE-AF) 0.05 % cream, Apply topically., Disp: ,  Rfl:    Azelastine  HCl 137 MCG/SPRAY SOLN, INSTILL 2 SPRAYS IN EACH NOSTRIL TWICE DAILY, Disp: 30 mL, Rfl: 12   Biotin 5000 MCG CAPS, Take 5,000 mcg by mouth daily., Disp: , Rfl:    budesonide  (ENTOCORT EC ) 3 MG 24 hr capsule, TAKE 3 CAPSULES DAILY, Disp: 180 capsule, Rfl: 5   carvedilol  (COREG ) 6.25 MG tablet, TAKE 1 TABLET (6.25 MG TOTAL) BY MOUTH 2 (TWO) TIMES DAILY., Disp: 180 tablet, Rfl: 2   cholecalciferol  (VITAMIN D3) 25 MCG (1000 UNIT) tablet, Take 1,000 Units by mouth daily., Disp: , Rfl:    clobetasol cream (TEMOVATE) 0.05 %, Apply 1 Application topically as needed., Disp: , Rfl:    colestipol  (COLESTID ) 1 g tablet, Take 2 tablets (2 g total) by mouth 2 (two) times daily., Disp: 360 tablet, Rfl: 2   cyanocobalamin  (VITAMIN B12) 1000 MCG tablet, Take 1,000 mcg by mouth daily., Disp: , Rfl:    cyclobenzaprine  (FLEXERIL ) 10 MG tablet, TAKE 1 TABLET BY MOUTH EVERYDAY AT BEDTIME, Disp: 90 tablet, Rfl: 1   folic acid  (FOLVITE ) 1 MG tablet, Take 1 mg by mouth daily., Disp: , Rfl:    LANTUS  SOLOSTAR 100 UNIT/ML Solostar Pen, INJECT 20 UNITS UNDER THE SKIN TWICE A DAY, Disp: 30 mL, Rfl: 3   lisinopril -hydrochlorothiazide  (ZESTORETIC ) 20-12.5 MG tablet, Take 1 tablet by mouth daily., Disp: 90 tablet, Rfl: 3   melatonin 5 MG TABS, Take 5 mg by mouth at bedtime., Disp: , Rfl:    metFORMIN  (GLUCOPHAGE -XR) 500 MG 24 hr tablet, Take 1 tablet (500 mg total) by mouth 2 (two) times daily., Disp: 180 tablet, Rfl: 2   methotrexate  (RHEUMATREX) 2.5 MG tablet, Take 17.5 mg by mouth every Sunday at 6pm., Disp: , Rfl:    metroNIDAZOLE  (METROCREAM ) 0.75 % cream, SMARTSIG:sparingly Topical Twice Daily, Disp: , Rfl:    mupirocin  ointment (BACTROBAN ) 2 %, Apply 1 Application topically 2 (two) times daily., Disp: 30 g, Rfl: 2   omeprazole  (PRILOSEC) 40 MG capsule, Take 1 capsule (40 mg total) by mouth 2 (two) times daily., Disp: 180 capsule, Rfl: 1   ondansetron  (ZOFRAN -ODT) 4 MG disintegrating tablet, DISSOLVE 1  TABLET BY MOUTH EVERY 8 HOURS AS NEEDED FOR NAUSEA AND VOMITING, Disp: 20 tablet, Rfl: 2   oxyCODONE -acetaminophen  (PERCOCET) 10-325 MG tablet, Take 1 tablet by mouth 3 (three) times daily as needed., Disp: , Rfl:    polyethylene glycol (MIRALAX  / GLYCOLAX ) 17 g packet, Take 17 g by mouth daily as needed for moderate constipation., Disp: , Rfl:    potassium chloride  (KLOR-CON  M) 10 MEQ tablet, , Disp: , Rfl:    Probiotic Product (PROBIOTIC DAILY PO), Take 1 capsule by mouth daily., Disp: , Rfl:    rosuvastatin  (CRESTOR ) 5 MG tablet, TAKE 1 TABLET BY MOUTH EVERY DAY, Disp: 90 tablet, Rfl: 2   sertraline  (ZOLOFT ) 100 MG tablet, TAKE 1.5 TABLETS (150MG  TOTAL) BY MOUTH DAILY, Disp: 135 tablet, Rfl: 1 Past Medical History:  Diagnosis Date   Anemia    Anxiety    Arthritis    Closed fracture of fifth metatarsal bone 05/13/2018   Deep vein thrombosis (HCC)    right calf - 05/2012    Depression    Diabetes mellitus without complication (  HCC)    diet controlled    GERD (gastroesophageal reflux disease)    Hyperlipidemia    Patient denies.   Hypertension    Ejection fraction =>55% Left ventricular systolic function is normal. Left ventricular wall motion is normal     Lymphocytic colitis    Neuropathy    diabetic - in bilateral feet    Pityriasis lichenoides chronica    Pneumonia    Sleep apnea    bipap   Past Surgical History:  Procedure Laterality Date   BILIARY STENT PLACEMENT  12/28/2022   Procedure: BILIARY STENT PLACEMENT;  Surgeon: Avram Lupita BRAVO, MD;  Location: [facility] ENDOSCOPY;  Service: Gastroenterology;;   BIOPSY  11/09/2022   Procedure: BIOPSY;  Surgeon: Wilhelmenia Aloha Raddle., MD;  Location: [facility];  Service: Gastroenterology;;   CHOLECYSTECTOMY N/A 12/23/2022   Procedure: LAPAROSCOPIC CHOLECYSTECTOMY WITH ICG DYE;  Surgeon: Tanda Locus, MD;  Location: [facility] ORS;  Service: General;  Laterality: N/A;   DILATION AND CURETTAGE OF UTERUS     ENDOSCOPIC RETROGRADE  CHOLANGIOPANCREATOGRAPHY (ERCP) WITH PROPOFOL  N/A 12/28/2022   Procedure: ENDOSCOPIC RETROGRADE CHOLANGIOPANCREATOGRAPHY (ERCP) WITH PROPOFOL ;  Surgeon: Avram Lupita BRAVO, MD;  Location: [facility] ENDOSCOPY;  Service: Gastroenterology;  Laterality: N/A;   ENDOSCOPIC RETROGRADE CHOLANGIOPANCREATOGRAPHY (ERCP) WITH PROPOFOL  N/A 02/22/2023   Procedure: ENDOSCOPIC RETROGRADE CHOLANGIOPANCREATOGRAPHY (ERCP) WITH PROPOFOL ;  Surgeon: Avram Lupita BRAVO, MD;  Location: [facility] ENDOSCOPY;  Service: Gastroenterology;  Laterality: N/A;   ESOPHAGOGASTRODUODENOSCOPY N/A 11/09/2022   Procedure: ESOPHAGOGASTRODUODENOSCOPY (EGD);  Surgeon: Wilhelmenia Aloha Raddle., MD;  Location: [facility];  Service: Gastroenterology;  Laterality: N/A;   EYE SURGERY  07/2020   GASTROINTESTINAL STENT REMOVAL N/A 02/22/2023   Procedure: GASTROINTESTINAL STENT REMOVAL;  Surgeon: Avram Lupita BRAVO, MD;  Location: [facility] ENDOSCOPY;  Service: Gastroenterology;  Laterality: N/A;   HAMMER TOE SURGERY     right hand surgery      due to blood infection    torn meniscus repair      right knee    TOTAL HIP ARTHROPLASTY  09/13/2012   Procedure: TOTAL HIP ARTHROPLASTY ANTERIOR APPROACH;  Surgeon: Donnice JONETTA Car, MD;  Location: [facility] OR[facility];  Service: Orthopedics;  Laterality: Right;   UPPER ESOPHAGEAL ENDOSCOPIC ULTRASOUND (EUS) N/A 11/09/2022   Procedure: UPPER ESOPHAGEAL ENDOSCOPIC ULTRASOUND (EUS);  Surgeon: Wilhelmenia Aloha Raddle., MD;  Location: [facility];  Service: Gastroenterology;  Laterality: N/A;   Family History  Problem Relation Age of Onset   Hypertension Mother    Social History   Socioeconomic History   Marital status: Divorced    Spouse name: Not on file   Number of children: Not on file   Years of education: Not on file   Highest education level: Professional school degree (e.g., MD, DDS, DVM, JD)  Occupational History   Not on file  Tobacco Use   Smoking status: Never    Passive exposure: Never   Smokeless tobacco: Never  Vaping Use    Vaping status: Not on file  Substance and Sexual Activity   Alcohol  use: Yes    Alcohol /week: 1.0 standard drink of alcohol     Types: 1 Glasses of wine per week    Comment: occasional wine    Drug use: No   Sexual activity: Not on file  Other Topics Concern   Not on file  Social History Narrative   She is widowed. She has 2 children, 2 grandchildren. She does drink occasional alcohol  . There is no tobacco . She does not have a routine exercise program  Social Drivers of Health   Financial Resource Strain: Patient Declined (03/16/2024)   Overall Financial Resource Strain (CARDIA)    Difficulty of Paying Living Expenses: Patient declined  Food Insecurity: Patient Declined (03/16/2024)   Hunger Vital Sign    Worried About Running Out of Food in the Last Year: Patient declined    Ran Out of Food in the Last Year: Patient declined  Transportation Needs: No Transportation Needs (03/16/2024)   PRAPARE - Administrator, Civil Service (Medical): No    Lack of Transportation (Non-Medical): No  Physical Activity: Insufficiently Active (10/27/2023)   Exercise Vital Sign    Days of Exercise per Week: 3 days    Minutes of Exercise per Session: 20 min  Stress: Stress Concern Present (03/16/2024)   [employer] of Occupational Health - Occupational Stress Questionnaire    Feeling of Stress: Rather much  Social Connections: Socially Integrated (03/16/2024)   Social Connection and Isolation Panel    Frequency of Communication with Friends and Family: More than three times a week    Frequency of Social Gatherings with Friends and Family: Twice a week    Attends Religious Services: More than 4 times per year    Active Member of [employer] or Organizations: Yes    Attends Banker Meetings: More than 4 times per year    Marital Status: Living with partner  Intimate Partner Violence: Not At Risk (10/27/2023)   Humiliation, Afraid, Rape, and Kick questionnaire    Fear of Current or  Ex-Partner: No    Emotionally Abused: No    Physically Abused: No    Sexually Abused: No       Objective:  There were no vitals taken for this visit.   Physical Exam  Gen. Pleasant, obese, in no distress, elderly ENT - no lesions, no post nasal drip Neck: No JVD, no thyromegaly, no carotid bruits Lungs: no use of accessory muscles, no dullness to percussion, decreased without rales or rhonchi  Cardiovascular: Rhythm regular, heart sounds  normal, no murmurs or gallops, no peripheral edema Musculoskeletal: No deformities, no cyanosis or clubbing , no tremors       Assessment & Plan:  Assessment and Plan Assessment & Plan   Assessment and Plan    Obstructive sleep apnea Long-standing obstructive sleep apnea managed with BiPAP. Recent adjustments to BiPAP settings have increased starting pressure to improve control of apneic events. She reports discomfort due to high pressure during the night and occasionally resets the machine. Current settings may not be optimal as events remain higher than desired, with previous reports showing 20-25 events per hour. Target is to reduce events to less than 15 per hour. She feels less rested and has decreased energy levels, possibly due to suboptimal treatment. Emphasis is placed on comfort and tolerability over perfect event reduction. - Obtain download report from DME, [facility], to assess current BiPAP settings and event frequency. - If residual hypopnea index (HI) is high, adjust BiPAP settings to increase starting pressure to 10 cm H2O while maintaining ramp feature for comfort. - Communicate findings and any changes to BiPAP settings with her once report is obtained.  Anxiety symptoms Experiencing anxiety symptoms related to personal stressors, including concern for significant other's health. Previously prescribed Xanax  twice daily as needed, but discontinued due to concerns about memory effects. Short-term use of Xanax  twice daily as needed  is advised to help manage anxiety symptoms. - Advise short-term use of Xanax  twice daily as needed for  anxiety management.           No follow-ups on file.   Harden ROCKFORD Jude, MD

## 2024-04-13 NOTE — Patient Instructions (Signed)
 X Obtain BiPAP download from [facility] & adjust EPAP pressure if significant events noted

## 2024-04-13 NOTE — Progress Notes (Signed)
 Epworth Sleepiness Scale  Use the following scale to choose the most appropriate number for each situation. 0 Would never nod off 1  Slight  chance of nodding off 2 Moderate chance of nodding off 3 High chance of nodding off  Sitting and reading: 1 Watching TV: 1 Sitting, inactive, in a public place (e.g., in a meeting, theater, or dinner event): 0 As a passenger in a car for an hour or more without stopping for a break: 0 Lying down to rest when circumstances permit:2 Sitting and talking to someone: 0 Sitting quietly after a meal without alcohol: 0 In a car, while stopped for a few  minutes in traffic or at a light: 0  TOTOAL: 3

## 2024-04-14 ENCOUNTER — Ambulatory Visit: Admitting: Physician Assistant

## 2024-04-14 DIAGNOSIS — L4059 Other psoriatic arthropathy: Secondary | ICD-10-CM | POA: Diagnosis not present
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
## 2024-04-19 ENCOUNTER — Ambulatory Visit: Admitting: Obstetrics

## 2024-04-20 ENCOUNTER — Ambulatory Visit: Admitting: Obstetrics and Gynecology

## 2024-05-01 ENCOUNTER — Encounter: Payer: Self-pay | Admitting: Internal Medicine

## 2024-05-01 DIAGNOSIS — M150 Primary generalized (osteo)arthritis: Secondary | ICD-10-CM

## 2024-05-09 ENCOUNTER — Other Ambulatory Visit: Payer: Self-pay | Admitting: Internal Medicine

## 2024-05-10 ENCOUNTER — Encounter: Payer: Self-pay | Admitting: Internal Medicine

## 2024-05-12 DIAGNOSIS — Z23 Encounter for immunization: Secondary | ICD-10-CM | POA: Diagnosis not present

## 2024-05-15 ENCOUNTER — Other Ambulatory Visit: Payer: Self-pay

## 2024-05-17 DIAGNOSIS — M13841 Other specified arthritis, right hand: Secondary | ICD-10-CM | POA: Diagnosis not present

## 2024-05-17 DIAGNOSIS — M13842 Other specified arthritis, left hand: Secondary | ICD-10-CM | POA: Diagnosis not present

## 2024-05-18 DIAGNOSIS — L4059 Other psoriatic arthropathy: Secondary | ICD-10-CM | POA: Diagnosis not present

## 2024-05-18 DIAGNOSIS — Z79899 Other long term (current) drug therapy: Secondary | ICD-10-CM | POA: Diagnosis not present

## 2024-05-19 ENCOUNTER — Other Ambulatory Visit: Payer: Self-pay | Admitting: Internal Medicine

## 2024-05-19 DIAGNOSIS — Z23 Encounter for immunization: Secondary | ICD-10-CM | POA: Diagnosis not present

## 2024-05-25 NOTE — Progress Notes (Signed)
 Primary:  Osteoarthritis involving multiple joints  [name] [name] presented as referral from Dr. Glade DOROTHA [facility]) for consideration of LDRT for osteoarthritis to multiple joints.  Location(s) of Symptomatic Pain: Lower back & Right shoulder  Pain on a scale of 0-10 is: {Number; 1-10  not applicable:20727}    If Spine, symptoms, if any, include: Bowel/Bladder retention or incontinence (please describe): *** Numbness or weakness in extremities (please describe): *** Current Decadron  regimen, if applicable: ***  Ambulatory status? Walker? Wheelchair?: {VQI Ambulatory Status:20974}  SAFETY ISSUES: Prior radiation? No Pacemaker/ICD? {:18581} Possible current pregnancy? Postmenopausal Is the patient on methotrexate ? Yes  Current Complaints / other details:    30 minutes spent total, including time for meaningful use questions, reviewing medication, as well as spent in face-to-face time in nurse evaluation with the patient.

## 2024-05-26 ENCOUNTER — Ambulatory Visit: Admitting: Physician Assistant

## 2024-05-31 ENCOUNTER — Ambulatory Visit
Admission: RE | Admit: 2024-05-31 | Discharge: 2024-05-31 | Disposition: A | Source: Ambulatory Visit | Attending: Radiation Oncology | Admitting: Radiation Oncology

## 2024-05-31 ENCOUNTER — Encounter: Payer: Self-pay | Admitting: Radiation Oncology

## 2024-05-31 ENCOUNTER — Encounter: Payer: Self-pay | Admitting: Internal Medicine

## 2024-05-31 ENCOUNTER — Ambulatory Visit
Admission: RE | Admit: 2024-05-31 | Discharge: 2024-05-31 | Disposition: A | Discharge status: Home / Self Care | Source: Ambulatory Visit | Attending: Radiation Oncology | Admitting: Radiation Oncology

## 2024-05-31 DIAGNOSIS — G4730 Sleep apnea, unspecified: Secondary | ICD-10-CM | POA: Insufficient documentation

## 2024-05-31 DIAGNOSIS — Z8701 Personal history of pneumonia (recurrent): Secondary | ICD-10-CM | POA: Insufficient documentation

## 2024-05-31 DIAGNOSIS — Z794 Long term (current) use of insulin: Secondary | ICD-10-CM | POA: Diagnosis not present

## 2024-05-31 DIAGNOSIS — E119 Type 2 diabetes mellitus without complications: Secondary | ICD-10-CM | POA: Diagnosis not present

## 2024-05-31 DIAGNOSIS — D649 Anemia, unspecified: Secondary | ICD-10-CM | POA: Diagnosis not present

## 2024-05-31 DIAGNOSIS — M47816 Spondylosis without myelopathy or radiculopathy, lumbar region: Secondary | ICD-10-CM | POA: Insufficient documentation

## 2024-05-31 DIAGNOSIS — G629 Polyneuropathy, unspecified: Secondary | ICD-10-CM | POA: Diagnosis not present

## 2024-05-31 DIAGNOSIS — E1140 Type 2 diabetes mellitus with diabetic neuropathy, unspecified: Secondary | ICD-10-CM | POA: Insufficient documentation

## 2024-05-31 DIAGNOSIS — Z79899 Other long term (current) drug therapy: Secondary | ICD-10-CM | POA: Insufficient documentation

## 2024-05-31 DIAGNOSIS — L4050 Arthropathic psoriasis, unspecified: Secondary | ICD-10-CM | POA: Insufficient documentation

## 2024-05-31 DIAGNOSIS — M150 Primary generalized (osteo)arthritis: Secondary | ICD-10-CM

## 2024-05-31 DIAGNOSIS — Z7984 Long term (current) use of oral hypoglycemic drugs: Secondary | ICD-10-CM | POA: Insufficient documentation

## 2024-05-31 DIAGNOSIS — K219 Gastro-esophageal reflux disease without esophagitis: Secondary | ICD-10-CM | POA: Diagnosis not present

## 2024-05-31 DIAGNOSIS — E785 Hyperlipidemia, unspecified: Secondary | ICD-10-CM | POA: Insufficient documentation

## 2024-05-31 DIAGNOSIS — L411 Pityriasis lichenoides chronica: Secondary | ICD-10-CM | POA: Diagnosis not present

## 2024-05-31 DIAGNOSIS — M545 Low back pain, unspecified: Secondary | ICD-10-CM

## 2024-05-31 DIAGNOSIS — M47896 Other spondylosis, lumbar region: Secondary | ICD-10-CM | POA: Insufficient documentation

## 2024-05-31 DIAGNOSIS — I10 Essential (primary) hypertension: Secondary | ICD-10-CM | POA: Insufficient documentation

## 2024-05-31 DIAGNOSIS — Z7952 Long term (current) use of systemic steroids: Secondary | ICD-10-CM | POA: Insufficient documentation

## 2024-05-31 DIAGNOSIS — Z86718 Personal history of other venous thrombosis and embolism: Secondary | ICD-10-CM | POA: Diagnosis not present

## 2024-05-31 NOTE — Progress Notes (Signed)
 Radiation Oncology         [phone] ________________________________  Initial outpatient Consultation  Name: [name] [name] MRN: [id number]  Date of Service: 05/31/2024 DOB: 1945/09/03  RR:Almwd, Glade PARAS, MD  Geofm Glade PARAS, MD   REFERRING PHYSICIAN: Geofm Glade PARAS, MD  DIAGNOSIS: 78 year old woman with progressive, debilitating low back pain secondary to osteoarthritis.    ICD-10-CM   1. Primary osteoarthritis involving multiple joints  M15.0     2. Chronic midline low back pain without sciatica  M54.50    G89.29     3. Other osteoarthritis of spine, lumbar region  M47.896       HISTORY OF PRESENT ILLNESS: [name] [name] is a 78 y.o. female seen at the request of Dr. Geofm.  She has a longstanding history of low back pain secondary to progressive osteoarthritis that has been refractory to OTC and prescription medications.  She has had multiple ESI's and even nerve ablation procedures with Dr. Bonner, none of which have provided any durable pain relief unfortunately.  She has been told that she has atrophy of the multifidus muscle that occurred after a bout with COVID.  The back pain has become debilitating and interferes with her ability to stand for any period of time.  The pain is worse with activities and does improve with rest.  She denies any radiculopathies, paresthesias, changes in bowel or bladder function or focal weakness in the lower extremities.  She has not had any recent injury or trauma.  She has oxycodone  10 mg to use only for severe pain.  She also has a history of psoriatic arthritis and is currently managed with Cimzia  and methotrexate  under the care of Dr. Jon Jacob.  She does also report significant pain in the right shoulder and bilateral hands secondary to known osteoarthritis but reports that the low back pain is her main focus at present.  PREVIOUS RADIATION THERAPY: No  PAST MEDICAL HISTORY:  Past Medical History:  Diagnosis Date   Anemia    Anxiety     Arthritis    Closed fracture of fifth metatarsal bone 05/13/2018   Deep vein thrombosis (HCC)    right calf - 05/2012    Depression    Diabetes mellitus without complication (HCC)    diet controlled    GERD (gastroesophageal reflux disease)    Hyperlipidemia    Patient denies.   Hypertension    Ejection fraction =>55% Left ventricular systolic function is normal. Left ventricular wall motion is normal     Lymphocytic colitis    Neuropathy    diabetic - in bilateral feet    Pityriasis lichenoides chronica    Pneumonia    Sleep apnea    bipap      PAST SURGICAL HISTORY: Past Surgical History:  Procedure Laterality Date   BILIARY STENT PLACEMENT  12/28/2022   Procedure: BILIARY STENT PLACEMENT;  Surgeon: Avram Lupita FORBES, MD;  Location: [facility] ENDOSCOPY;  Service: Gastroenterology;;   BIOPSY  11/09/2022   Procedure: BIOPSY;  Surgeon: Wilhelmenia Aloha Raddle., MD;  Location: [facility];  Service: Gastroenterology;;   CHOLECYSTECTOMY N/A 12/23/2022   Procedure: LAPAROSCOPIC CHOLECYSTECTOMY WITH ICG DYE;  Surgeon: Tanda Locus, MD;  Location: [facility];  Service: General;  Laterality: N/A;   DILATION AND CURETTAGE OF UTERUS     ENDOSCOPIC RETROGRADE CHOLANGIOPANCREATOGRAPHY (ERCP) WITH PROPOFOL  N/A 12/28/2022   Procedure: ENDOSCOPIC RETROGRADE CHOLANGIOPANCREATOGRAPHY (ERCP) WITH PROPOFOL ;  Surgeon: Avram Lupita FORBES, MD;  Location: [facility] ENDOSCOPY;  Service: Gastroenterology;  Laterality: N/A;   ENDOSCOPIC RETROGRADE CHOLANGIOPANCREATOGRAPHY (ERCP) WITH PROPOFOL  N/A 02/22/2023   Procedure: ENDOSCOPIC RETROGRADE CHOLANGIOPANCREATOGRAPHY (ERCP) WITH PROPOFOL ;  Surgeon: Avram Lupita BRAVO, MD;  Location: [facility] ENDOSCOPY;  Service: Gastroenterology;  Laterality: N/A;   ESOPHAGOGASTRODUODENOSCOPY N/A 11/09/2022   Procedure: ESOPHAGOGASTRODUODENOSCOPY (EGD);  Surgeon: Wilhelmenia Aloha Raddle., MD;  Location: [facility];  Service: Gastroenterology;  Laterality: N/A;   EYE SURGERY  07/2020   GASTROINTESTINAL  STENT REMOVAL N/A 02/22/2023   Procedure: GASTROINTESTINAL STENT REMOVAL;  Surgeon: Avram Lupita BRAVO, MD;  Location: [facility] ENDOSCOPY;  Service: Gastroenterology;  Laterality: N/A;   HAMMER TOE SURGERY     right hand surgery      due to blood infection    torn meniscus repair      right knee    TOTAL HIP ARTHROPLASTY  09/13/2012   Procedure: TOTAL HIP ARTHROPLASTY ANTERIOR APPROACH;  Surgeon: Donnice JONETTA Car, MD;  Location: [facility];  Service: Orthopedics;  Laterality: Right;   UPPER ESOPHAGEAL ENDOSCOPIC ULTRASOUND (EUS) N/A 11/09/2022   Procedure: UPPER ESOPHAGEAL ENDOSCOPIC ULTRASOUND (EUS);  Surgeon: Wilhelmenia Aloha Raddle., MD;  Location: [facility];  Service: Gastroenterology;  Laterality: N/A;    FAMILY HISTORY:  Family History  Problem Relation Age of Onset   Hypertension Mother     SOCIAL HISTORY:  Social History   Socioeconomic History   Marital status: Divorced    Spouse name: Not on file   Number of children: Not on file   Years of education: Not on file   Highest education level: Professional school degree (e.g., MD, DDS, DVM, JD)  Occupational History   Not on file  Tobacco Use   Smoking status: Never    Passive exposure: Never   Smokeless tobacco: Never  Vaping Use   Vaping status: Never Used  Substance and Sexual Activity   Alcohol  use: Yes    Alcohol /week: 1.0 standard drink of alcohol     Types: 1 Glasses of wine per week    Comment: occasional wine    Drug use: No   Sexual activity: Not on file  Other Topics Concern   Not on file  Social History Narrative   She is widowed. She has 2 children, 2 grandchildren. She does drink occasional alcohol  . There is no tobacco . She does not have a routine exercise program   Social Drivers of Health   Financial Resource Strain: Patient Declined (03/16/2024)   Overall Financial Resource Strain (CARDIA)    Difficulty of Paying Living Expenses: Patient declined  Food Insecurity: No Food Insecurity (05/31/2024)   Hunger  Vital Sign    Worried About Running Out of Food in the Last Year: Never true    Ran Out of Food in the Last Year: Never true  Transportation Needs: No Transportation Needs (05/31/2024)   PRAPARE - Administrator, Civil Service (Medical): No    Lack of Transportation (Non-Medical): No  Physical Activity: Insufficiently Active (10/27/2023)   Exercise Vital Sign    Days of Exercise per Week: 3 days    Minutes of Exercise per Session: 20 min  Stress: Stress Concern Present (03/16/2024)   [employer] of Occupational Health - Occupational Stress Questionnaire    Feeling of Stress: Rather much  Social Connections: Socially Integrated (03/16/2024)   Social Connection and Isolation Panel    Frequency of Communication with Friends and Family: More than three times a week    Frequency of Social Gatherings with Friends and Family: Twice a week  Attends Religious Services: More than 4 times per year    Active Member of Clubs or Organizations: Yes    Attends Banker Meetings: More than 4 times per year    Marital Status: Living with partner  Intimate Partner Violence: Not At Risk (05/31/2024)   Humiliation, Afraid, Rape, and Kick questionnaire    Fear of Current or Ex-Partner: No    Emotionally Abused: No    Physically Abused: No    Sexually Abused: No    ALLERGIES: Other, Thorazine  [chlorpromazine ], Bee venom, Farxiga  [dapagliflozin ], Fish allergy, Semaglutide , and Sulfa antibiotics  MEDICATIONS:  Current Outpatient Medications  Medication Sig Dispense Refill   acetaminophen  (TYLENOL ) 500 MG tablet Take 1 tablet (500 mg total) by mouth every 6 (six) hours as needed for mild pain or fever.     acyclovir  ointment (ZOVIRAX ) 5 % Apply 1 Application topically every 3 (three) hours. 15 g 5   allopurinol  (ZYLOPRIM ) 100 MG tablet TAKE 1 TABLET BY MOUTH TWICE A DAY 180 tablet 2   ALPRAZolam  (XANAX ) 0.25 MG tablet Take 1 tablet (0.25 mg total) by mouth 2 (two) times daily as  needed. 60 tablet 5   augmented betamethasone dipropionate (DIPROLENE-AF) 0.05 % cream Apply topically.     Azelastine  HCl 137 MCG/SPRAY SOLN INSTILL 2 SPRAYS IN EACH NOSTRIL TWICE DAILY 30 mL 12   Biotin 5000 MCG CAPS Take 5,000 mcg by mouth daily.     budesonide  (ENTOCORT EC ) 3 MG 24 hr capsule TAKE 3 CAPSULES DAILY 180 capsule 5   carvedilol  (COREG ) 6.25 MG tablet TAKE 1 TABLET (6.25 MG TOTAL) BY MOUTH 2 (TWO) TIMES DAILY. 180 tablet 2   cholecalciferol  (VITAMIN D3) 25 MCG (1000 UNIT) tablet Take 1,000 Units by mouth daily.     ciclopirox (LOPROX) 0.77 % cream Apply topically 2 (two) times daily.     clobetasol cream (TEMOVATE) 0.05 % Apply 1 Application topically as needed.     colestipol  (COLESTID ) 1 g tablet Take 2 tablets (2 g total) by mouth 2 (two) times daily. 360 tablet 2   cyanocobalamin  (VITAMIN B12) 1000 MCG tablet Take 1,000 mcg by mouth daily.     cyclobenzaprine  (FLEXERIL ) 10 MG tablet TAKE 1 TABLET BY MOUTH EVERYDAY AT BEDTIME 90 tablet 1   folic acid  (FOLVITE ) 1 MG tablet Take 1 mg by mouth daily.     LANTUS  SOLOSTAR 100 UNIT/ML Solostar Pen INJECT 20 UNITS UNDER THE SKIN TWICE A DAY 30 mL 3   lisinopril -hydrochlorothiazide  (ZESTORETIC ) 20-12.5 MG tablet TAKE 1 TABLET BY MOUTH EVERY DAY 90 tablet 3   melatonin 5 MG TABS Take 5 mg by mouth at bedtime.     metFORMIN  (GLUCOPHAGE -XR) 500 MG 24 hr tablet Take 1 tablet (500 mg total) by mouth 2 (two) times daily. 180 tablet 2   methotrexate  (RHEUMATREX) 2.5 MG tablet Take 17.5 mg by mouth every Sunday at 6pm.     metroNIDAZOLE  (METROCREAM ) 0.75 % cream SMARTSIG:sparingly Topical Twice Daily     mupirocin  ointment (BACTROBAN ) 2 % Apply 1 Application topically 2 (two) times daily. 30 g 2   omeprazole  (PRILOSEC) 40 MG capsule Take 1 capsule (40 mg total) by mouth 2 (two) times daily. 180 capsule 1   ondansetron  (ZOFRAN -ODT) 4 MG disintegrating tablet DISSOLVE 1 TABLET BY MOUTH EVERY 8 HOURS AS NEEDED FOR NAUSEA AND VOMITING 20 tablet 2    oxyCODONE -acetaminophen  (PERCOCET) 10-325 MG tablet Take 1 tablet by mouth 3 (three) times daily as needed.  polyethylene glycol (MIRALAX  / GLYCOLAX ) 17 g packet Take 17 g by mouth daily as needed for moderate constipation.     potassium chloride  (KLOR-CON  M) 10 MEQ tablet      Probiotic Product (PROBIOTIC DAILY PO) Take 1 capsule by mouth daily.     rosuvastatin  (CRESTOR ) 5 MG tablet TAKE 1 TABLET BY MOUTH EVERY DAY 90 tablet 2   sertraline  (ZOLOFT ) 100 MG tablet TAKE 2 TABLETS BY MOUTH EVERY DAY 180 tablet 1   No current facility-administered medications for this encounter.    REVIEW OF SYSTEMS:  On review of systems, the patient reports that she is doing well overall.  She denies any chest pain, shortness of breath, cough, fevers, chills, night sweats, or recent unintended weight changes.  She denies any bowel or bladder disturbances, and denies abdominal pain, nausea or vomiting.  She reports low back pain as documented above in the HPI as well as right shoulder and bilateral hand pain secondary to osteoarthritis.  The low back pain is most bothersome at this time.  A complete review of systems is obtained and is otherwise negative.    PHYSICAL EXAM:  Wt Readings from Last 3 Encounters:  04/13/24 204 lb (92.5 kg)  03/20/24 210 lb (95.3 kg)  01/25/24 193 lb (87.5 kg)   Temp Readings from Last 3 Encounters:  05/31/24 (!) (P) 97.5 F (36.4 C) ((P) Temporal)  03/20/24 98 F (36.7 C) (Oral)  02/04/24 98 F (36.7 C) (Oral)   BP Readings from Last 3 Encounters:  05/31/24 (P) 131/75  04/13/24 132/71  03/20/24 114/76   Pulse Readings from Last 3 Encounters:  05/31/24 (P) 84  04/13/24 97  03/20/24 72   Pain Assessment Pain Score: 7  Pain Loc: Back (right shoulder, right knee, bilateral hands)/10  In general this is a well appearing Caucasian woman in no acute distress.  She's alert and oriented x4 and appropriate throughout the examination. Cardiopulmonary assessment is  negative for acute distress and she exhibits normal effort.  She typically ambulates with a cane around the house but is currently in a wheelchair because she cannot walk any distance due to the severe low back pain.  KPS = 90  100 - Normal; no complaints; no evidence of disease. 90   - Able to carry on normal activity; minor signs or symptoms of disease. 80   - Normal activity with effort; some signs or symptoms of disease. 78   - Cares for self; unable to carry on normal activity or to do active work. 60   - Requires occasional assistance, but is able to care for most of his personal needs. 50   - Requires considerable assistance and frequent medical care. 40   - Disabled; requires special care and assistance. 30   - Severely disabled; hospital admission is indicated although death not imminent. 20   - Very sick; hospital admission necessary; active supportive treatment necessary. 10   - Moribund; fatal processes progressing rapidly. 0     - Dead  Karnofsky DA, Abelmann WH, Craver LS and Burchenal [facility] [phone]) The use of the nitrogen mustards in the palliative treatment of carcinoma: with particular reference to bronchogenic carcinoma Cancer 1 634-56  LABORATORY DATA:  Lab Results  Component Value Date   WBC 10.3 03/20/2024   HGB 10.8 (L) 03/20/2024   HCT 33.6 (L) 03/20/2024   MCV 93.0 03/20/2024   PLT 207.0 03/20/2024   Lab Results  Component Value Date   NA 134 (L)  03/20/2024   K 5.1 03/20/2024   CL 97 03/20/2024   CO2 29 03/20/2024   Lab Results  Component Value Date   ALT 13 03/20/2024   AST 18 03/20/2024   ALKPHOS 75 03/20/2024   BILITOT 0.3 03/20/2024     RADIOGRAPHY: No results found.    IMPRESSION/PLAN: 1. 78 y.o. woman with progressive, debilitating low back pain secondary to osteoarthritis. After a detailed discussion of the patient's history, prior treatment response, and current goals, we reviewed the proposed protocol for LDRT targeting the lumbar spine at  L4-5. Our institutional approach follows a regimen of 3 Gy total, delivered in 6 fractions of 0.5 Gy on a Monday-Wednesday-Friday schedule over two weeks. This dosing is consistent with published guidelines and peer-reviewed data for non-malignant musculoskeletal conditions.  We reviewed:   Goals of care: symptom relief and improved mobility Expected timeline for therapeutic benefit: typically several weeks to months Potential risks, including rare but theoretical concerns regarding late tissue effects or secondary malignancy (risk estimated to be extremely low at this dose and age) Lack of immunosuppression and inactive autoimmune history, minimizing concern for immune-mediated complications   Our goal with low dose radiation therapy is to help reduce chronic joint pain and improve her mobility, particularly for activities like standing, walking and ADLs. This treatment does not reverse joint damage, but it can calm the inflammation in and around the joint that contributes to pain. Based on published data, about 60-80% of patients with osteoarthritis experience meaningful pain relief within a few weeks to months after completing a short course of low-dose radiation. While not everyone responds, the majority of patients do report improvement, and the treatment is generally well tolerated.  The patient, [name] [name], was agreeable to proceed so we will share our discussion with Dr. Geofm.  She has freely signed written consent to proceed today in the office and a copy of this document will be placed in her medical record.  She is tentatively scheduled for CT simulation for treatment planning at 3 PM on 06/12/2024 with plans to initiate treatment pending final review and setup. A follow-up visit will be scheduled approximately 8-12 weeks after completion of LDRT to assess clinical response.   We personally spent 60 minutes in this encounter including chart review, reviewing radiological studies, meeting  face-to-face with the patient, entering orders and completing documentation.   Sabra MICAEL Rusk, PA-C    Donnice Barge, MD  [facility]  Radiation Oncology Direct Dial: [phone]  Fax: [phone] [email or web address]  Skype  LinkedIn   Reference: Molinda HERO, et al. Randomized, double-blind, placebo-controlled trial on the effect of radiotherapy for painful heel spur (plantar fasciitis). Int JINNY Shock Oncol Biol Phys. 2012;84(5):e667-e672. [email or web address]://lucas.[email or web address]

## 2024-06-01 ENCOUNTER — Encounter: Payer: Self-pay | Admitting: Internal Medicine

## 2024-06-01 DIAGNOSIS — L659 Nonscarring hair loss, unspecified: Secondary | ICD-10-CM | POA: Diagnosis not present

## 2024-06-01 DIAGNOSIS — L718 Other rosacea: Secondary | ICD-10-CM | POA: Diagnosis not present

## 2024-06-01 DIAGNOSIS — L400 Psoriasis vulgaris: Secondary | ICD-10-CM | POA: Diagnosis not present

## 2024-06-01 DIAGNOSIS — L821 Other seborrheic keratosis: Secondary | ICD-10-CM | POA: Diagnosis not present

## 2024-06-02 ENCOUNTER — Encounter: Payer: Self-pay | Admitting: Internal Medicine

## 2024-06-02 DIAGNOSIS — Z993 Dependence on wheelchair: Secondary | ICD-10-CM

## 2024-06-02 DIAGNOSIS — R2689 Other abnormalities of gait and mobility: Secondary | ICD-10-CM

## 2024-06-05 DIAGNOSIS — M5416 Radiculopathy, lumbar region: Secondary | ICD-10-CM | POA: Diagnosis not present

## 2024-06-05 DIAGNOSIS — M79642 Pain in left hand: Secondary | ICD-10-CM | POA: Diagnosis not present

## 2024-06-05 DIAGNOSIS — Z79899 Other long term (current) drug therapy: Secondary | ICD-10-CM | POA: Diagnosis not present

## 2024-06-05 DIAGNOSIS — M25511 Pain in right shoulder: Secondary | ICD-10-CM | POA: Diagnosis not present

## 2024-06-05 DIAGNOSIS — G8929 Other chronic pain: Secondary | ICD-10-CM | POA: Diagnosis not present

## 2024-06-05 DIAGNOSIS — M79641 Pain in right hand: Secondary | ICD-10-CM | POA: Diagnosis not present

## 2024-06-05 DIAGNOSIS — Z5181 Encounter for therapeutic drug level monitoring: Secondary | ICD-10-CM | POA: Diagnosis not present

## 2024-06-07 ENCOUNTER — Other Ambulatory Visit: Payer: Self-pay | Admitting: Internal Medicine

## 2024-06-07 MED ORDER — ACCU-CHEK GUIDE W/DEVICE KIT: PACK | 0 refills | Status: DC

## 2024-06-07 NOTE — Addendum Note (Signed)
 Addended by: GEOFM GLADE PARAS on: 06/07/2024 09:23 PM   Modules accepted: Orders

## 2024-06-08 DIAGNOSIS — D3131 Benign neoplasm of right choroid: Secondary | ICD-10-CM | POA: Diagnosis not present

## 2024-06-08 DIAGNOSIS — Z961 Presence of intraocular lens: Secondary | ICD-10-CM | POA: Diagnosis not present

## 2024-06-08 DIAGNOSIS — E119 Type 2 diabetes mellitus without complications: Secondary | ICD-10-CM | POA: Diagnosis not present

## 2024-06-08 DIAGNOSIS — H524 Presbyopia: Secondary | ICD-10-CM | POA: Diagnosis not present

## 2024-06-08 LAB — OPHTHALMOLOGY REPORT-SCANNED

## 2024-06-11 ENCOUNTER — Other Ambulatory Visit: Payer: Self-pay | Admitting: Internal Medicine

## 2024-06-11 DIAGNOSIS — D508 Other iron deficiency anemias: Secondary | ICD-10-CM

## 2024-06-11 DIAGNOSIS — D509 Iron deficiency anemia, unspecified: Secondary | ICD-10-CM | POA: Insufficient documentation

## 2024-06-12 ENCOUNTER — Other Ambulatory Visit: Payer: Self-pay

## 2024-06-12 ENCOUNTER — Ambulatory Visit
Admission: RE | Admit: 2024-06-12 | Discharge: 2024-06-12 | Disposition: A | Discharge status: Home / Self Care | Source: Ambulatory Visit | Attending: Radiation Oncology | Admitting: Radiation Oncology

## 2024-06-12 DIAGNOSIS — E1142 Type 2 diabetes mellitus with diabetic polyneuropathy: Secondary | ICD-10-CM

## 2024-06-12 DIAGNOSIS — M150 Primary generalized (osteo)arthritis: Secondary | ICD-10-CM | POA: Diagnosis not present

## 2024-06-12 DIAGNOSIS — Z510 Encounter for antineoplastic radiation therapy: Secondary | ICD-10-CM | POA: Diagnosis not present

## 2024-06-12 DIAGNOSIS — M1909 Primary osteoarthritis, other specified site: Secondary | ICD-10-CM | POA: Insufficient documentation

## 2024-06-12 DIAGNOSIS — G8929 Other chronic pain: Secondary | ICD-10-CM

## 2024-06-12 MED ORDER — ACCU-CHEK GUIDE W/DEVICE KIT: PACK | 0 refills | Status: AC

## 2024-06-13 NOTE — Progress Notes (Signed)
  Radiation Oncology         [phone] ________________________________  Name: RIANNON MUKHERJEE MRN: [id number]  Date: 06/12/2024  DOB: Dec 22, 1945  SIMULATION AND TREATMENT PLANNING NOTE    ICD-10-CM   1. Chronic midline low back pain without sciatica  M54.50    G89.29       DIAGNOSIS:  78 y.o. patient with lumbar osteoarthritis  NARRATIVE:  The patient was brought to the CT Simulation planning suite.  Identity was confirmed.  All relevant records and images related to the planned course of therapy were reviewed.  The patient freely provided informed written consent to proceed with treatment after reviewing the details related to the planned course of therapy. The consent form was witnessed and verified by the simulation staff.  Then, the patient was set-up in a stable reproducible  supine position for radiation therapy.  CT images were obtained.  Surface markings were placed.  The CT images were loaded into the planning software.  Then the target and avoidance structures were contoured including kidneys.  Treatment planning then occurred.  The radiation prescription was entered and confirmed.  Then, I designed and supervised the construction of a total of 3 medically necessary complex treatment devices with VacLoc positioner and 2 MLCs to shield kidneys.  I have requested : 3D Simulation  I have requested a DVH of the following structures: Left Kidney, Right Kidney and target.  PLAN:  The patient will receive 3 Gy in 6 fractions.  ________________________________  Donnice FELIX Patrcia, M.D.

## 2024-06-15 DIAGNOSIS — L4059 Other psoriatic arthropathy: Secondary | ICD-10-CM | POA: Diagnosis not present

## 2024-06-18 MED ORDER — INSULIN GLARGINE-YFGN 100 UNIT/ML ~~LOC~~ SOPN: 20.0000 [IU] | PEN_INJECTOR | Freq: Two times a day (BID) | SUBCUTANEOUS | 3 refills | Status: DC

## 2024-06-20 ENCOUNTER — Other Ambulatory Visit: Payer: Self-pay | Admitting: Internal Medicine

## 2024-06-20 NOTE — Progress Notes (Signed)
 Subjective:    Patient ID: [name] [name], female    DOB: Oct 07, 1945, 78 y.o.   MRN: [id number]      HPI [name] [name] is here for No chief complaint on file.   Middle Ear Myoclonus: Involuntary contractions of the stapedius or tensor tympani muscles can produce a rhythmic knocking or clicking sensation in the ear.  TMJ Cerumen Patulous Eustachian Tube: Abnormal patency of the Eustachian tube can cause unusual auditory sensations, including autophony and sometimes rhythmic or pulsatile sounds, especially with breathing or movement. Patients often report improvement with postural changes (e.g., lying down) and may have associated aural fullness or voice/breathing autophony Venous or Arterial Vascular Anomalies    Medications and allergies reviewed with patient and updated if appropriate.  Current Outpatient Medications on File Prior to Visit  Medication Sig Dispense Refill   acetaminophen  (TYLENOL ) 500 MG tablet Take 1 tablet (500 mg total) by mouth every 6 (six) hours as needed for mild pain or fever.     acyclovir  ointment (ZOVIRAX ) 5 % Apply 1 Application topically every 3 (three) hours. 15 g 5   allopurinol  (ZYLOPRIM ) 100 MG tablet TAKE 1 TABLET BY MOUTH TWICE A DAY 180 tablet 2   ALPRAZolam  (XANAX ) 0.25 MG tablet Take 1 tablet (0.25 mg total) by mouth 2 (two) times daily as needed. 60 tablet 5   augmented betamethasone dipropionate (DIPROLENE-AF) 0.05 % cream Apply topically.     Azelastine  HCl 137 MCG/SPRAY SOLN INSTILL 2 SPRAYS IN EACH NOSTRIL TWICE DAILY 30 mL 12   Biotin 5000 MCG CAPS Take 5,000 mcg by mouth daily.     Blood Glucose Monitoring Suppl (ACCU-CHEK GUIDE) w/Device KIT UAD to monitor sugars 2 times daily and prn.  E11.40 1 kit 0   budesonide  (ENTOCORT EC ) 3 MG 24 hr capsule TAKE 3 CAPSULES DAILY 180 capsule 5   carvedilol  (COREG ) 6.25 MG tablet TAKE 1 TABLET (6.25 MG TOTAL) BY MOUTH 2 (TWO) TIMES DAILY. 180 tablet 2   cholecalciferol  (VITAMIN D3) 25 MCG (1000  UNIT) tablet Take 1,000 Units by mouth daily.     ciclopirox (LOPROX) 0.77 % cream Apply topically 2 (two) times daily.     clobetasol cream (TEMOVATE) 0.05 % Apply 1 Application topically as needed.     colestipol  (COLESTID ) 1 g tablet Take 2 tablets (2 g total) by mouth 2 (two) times daily. 360 tablet 2   cyanocobalamin  (VITAMIN B12) 1000 MCG tablet Take 1,000 mcg by mouth daily.     cyclobenzaprine  (FLEXERIL ) 10 MG tablet TAKE 1 TABLET BY MOUTH EVERYDAY AT BEDTIME 90 tablet 1   folic acid  (FOLVITE ) 1 MG tablet Take 1 mg by mouth daily.     insulin  glargine-yfgn (SEMGLEE ) 100 UNIT/ML Pen Inject 20 Units into the skin 2 (two) times daily. 40 mL 3   lisinopril -hydrochlorothiazide  (ZESTORETIC ) 20-12.5 MG tablet TAKE 1 TABLET BY MOUTH EVERY DAY 90 tablet 3   melatonin 5 MG TABS Take 5 mg by mouth at bedtime.     metFORMIN  (GLUCOPHAGE -XR) 500 MG 24 hr tablet Take 1 tablet (500 mg total) by mouth 2 (two) times daily. 180 tablet 2   methotrexate  (RHEUMATREX) 2.5 MG tablet Take 17.5 mg by mouth every Sunday at 6pm.     metroNIDAZOLE  (METROCREAM ) 0.75 % cream SMARTSIG:sparingly Topical Twice Daily     mupirocin  ointment (BACTROBAN ) 2 % Apply 1 Application topically 2 (two) times daily. 30 g 2   omeprazole  (PRILOSEC) 40 MG capsule Take 1 capsule (40 mg total)  by mouth 2 (two) times daily. 180 capsule 1   ondansetron  (ZOFRAN -ODT) 4 MG disintegrating tablet DISSOLVE 1 TABLET BY MOUTH EVERY 8 HOURS AS NEEDED FOR NAUSEA AND VOMITING 20 tablet 2   oxyCODONE -acetaminophen  (PERCOCET) 10-325 MG tablet Take 1 tablet by mouth 3 (three) times daily as needed.     polyethylene glycol (MIRALAX  / GLYCOLAX ) 17 g packet Take 17 g by mouth daily as needed for moderate constipation.     potassium chloride  (KLOR-CON  M) 10 MEQ tablet      Probiotic Product (PROBIOTIC DAILY PO) Take 1 capsule by mouth daily.     rosuvastatin  (CRESTOR ) 5 MG tablet TAKE 1 TABLET BY MOUTH EVERY DAY 90 tablet 2   sertraline  (ZOLOFT ) 100 MG  tablet TAKE 2 TABLETS BY MOUTH EVERY DAY 180 tablet 1   No current facility-administered medications on file prior to visit.    Review of Systems     Objective:  There were no vitals filed for this visit. BP Readings from Last 3 Encounters:  05/31/24 (P) 131/75  04/13/24 132/71  03/20/24 114/76   Wt Readings from Last 3 Encounters:  04/13/24 204 lb (92.5 kg)  03/20/24 210 lb (95.3 kg)  01/25/24 193 lb (87.5 kg)   There is no height or weight on file to calculate BMI.    Physical Exam         Assessment & Plan:    See Problem List for Assessment and Plan of chronic medical problems.

## 2024-06-21 ENCOUNTER — Telehealth: Payer: Self-pay

## 2024-06-21 ENCOUNTER — Encounter: Payer: Self-pay | Admitting: Internal Medicine

## 2024-06-21 ENCOUNTER — Ambulatory Visit (INDEPENDENT_AMBULATORY_CARE_PROVIDER_SITE_OTHER): Admitting: Internal Medicine

## 2024-06-21 VITALS — BP 116/68 | HR 71 | Temp 97.9°F | Ht 66.0 in

## 2024-06-21 DIAGNOSIS — I10 Essential (primary) hypertension: Secondary | ICD-10-CM

## 2024-06-21 DIAGNOSIS — H6121 Impacted cerumen, right ear: Secondary | ICD-10-CM | POA: Insufficient documentation

## 2024-06-21 DIAGNOSIS — H833X1 Noise effects on right inner ear: Secondary | ICD-10-CM | POA: Insufficient documentation

## 2024-06-21 MED ORDER — POTASSIUM CHLORIDE CRYS ER 10 MEQ PO TBCR: 10.0000 meq | EXTENDED_RELEASE_TABLET | Freq: Three times a day (TID) | ORAL | 3 refills | Status: AC

## 2024-06-21 NOTE — Assessment & Plan Note (Signed)
 Chronic Blood pressure well controlled Continue carvedilol  6.25 mg twice daily, lisinopril -hctz 20-25 mg daily

## 2024-06-21 NOTE — Telephone Encounter (Signed)
 Copied from CRM [id number]. Topic: Clinical - Home Health Verbal Orders >> Jun 20, 2024  4:19 PM Delon DASEN wrote: Caller/Agency: Brandy with [facility] Callback Number: [phone] Service Requested: Occupational Therapy Frequency: not determined yet Any new concerns about the patient? No- need order to eval, patient now has a power chair

## 2024-06-21 NOTE — Patient Instructions (Addendum)
    Try ear wax removal drops - debrox to see if you can get the rest of the wax out.  If this is not successful or if your symptoms do not resolve let me know.   We can have you see ENT.      Medications changes include :   None     Return if symptoms worsen or fail to improve.

## 2024-06-21 NOTE — Assessment & Plan Note (Signed)
 Acute Right ear canal impacted with cerumen which is likely the cause of her decreased hearing and may also be causing the thumping sound in the right ear Ear lavage today by CMA-not all the cerumen was able to be removed-stopped due to discomfort She will try earwax removal drops at home and hopefully that will remove the wax and her symptoms will resolve If it does not she will let me know and I will refer to ENT

## 2024-06-21 NOTE — Assessment & Plan Note (Addendum)
 Acute States it is something sound in her right ear that is intermittent for the past couple of weeks She has some decreased hearing and felt off balance on occasion-more than usual She does have impacted cerumen which may be the cause-partial removal of wax today here-needs to remove all of the wax to see if that resolves her symptoms-Will attempt using Debrox earwax removal drops at home-if that is not successful can refer to ENT Other possible causes of symptoms include TMJ which she does not have any symptoms consistent with, neurological cause, but she is not experiencing any other neurological deficits, vascular abnormality which she will need to be screened for if her symptoms do not go away with complete wax removal

## 2024-06-21 NOTE — Progress Notes (Signed)
  PRE-PROCEDURE EXAM: Right TM cannot be visualized due to total occlusion/impaction of the ear canal.  PROCEDURE INDICATION: remove wax to visualize ear drum & relieve discomfort  CONSENT:  Verbal   PROCEDURE NOTE:  RIGHT EAR:  The CMA irrigated the ear canal with warm water  to remove the wax.  POST- PROCEDURE EXAM: Right ear canal with scant remaining cerumen.    Patient tolerated the procedure well and had relief of their symptoms after the procedure.

## 2024-06-22 DIAGNOSIS — Z510 Encounter for antineoplastic radiation therapy: Secondary | ICD-10-CM | POA: Diagnosis not present

## 2024-06-22 DIAGNOSIS — M150 Primary generalized (osteo)arthritis: Secondary | ICD-10-CM | POA: Diagnosis not present

## 2024-06-22 DIAGNOSIS — M1909 Primary osteoarthritis, other specified site: Secondary | ICD-10-CM | POA: Diagnosis not present

## 2024-06-22 NOTE — Telephone Encounter (Signed)
 Order faxed to number provided

## 2024-06-22 NOTE — Telephone Encounter (Signed)
 Leotis from [facility] called in stating that the fax number that was given to Tobias was I correct,and the correct fax number is: [phone]

## 2024-06-22 NOTE — Telephone Encounter (Signed)
 Called and left message for [place].  Mary faxed notes to number below: Info left for [place] with fax number it was sent to.   Type Date User Summary Attachment  General 06/19/2024  7:46 AM Orlando Ronal POUR Faxed to -  Note: Faxed to [facility] [address] [place] [address] [phone]

## 2024-06-23 ENCOUNTER — Other Ambulatory Visit (HOSPITAL_COMMUNITY): Payer: Self-pay

## 2024-06-23 ENCOUNTER — Telehealth: Payer: Self-pay | Admitting: Internal Medicine

## 2024-06-23 DIAGNOSIS — H938X1 Other specified disorders of right ear: Secondary | ICD-10-CM

## 2024-06-23 NOTE — Telephone Encounter (Signed)
 Patient called and said that her ear has not improved at all and would like to be referred to an ear specialist.

## 2024-06-23 NOTE — Telephone Encounter (Signed)
 Referral order

## 2024-06-26 ENCOUNTER — Other Ambulatory Visit: Payer: Self-pay

## 2024-06-26 ENCOUNTER — Encounter: Payer: Self-pay | Admitting: Internal Medicine

## 2024-06-26 ENCOUNTER — Ambulatory Visit
Admission: RE | Admit: 2024-06-26 | Discharge: 2024-06-26 | Disposition: A | Source: Ambulatory Visit | Attending: Radiation Oncology

## 2024-06-26 DIAGNOSIS — Z510 Encounter for antineoplastic radiation therapy: Secondary | ICD-10-CM | POA: Diagnosis not present

## 2024-06-26 DIAGNOSIS — M1909 Primary osteoarthritis, other specified site: Secondary | ICD-10-CM | POA: Diagnosis not present

## 2024-06-26 DIAGNOSIS — H938X1 Other specified disorders of right ear: Secondary | ICD-10-CM

## 2024-06-26 DIAGNOSIS — H833X1 Noise effects on right inner ear: Secondary | ICD-10-CM

## 2024-06-26 DIAGNOSIS — M150 Primary generalized (osteo)arthritis: Secondary | ICD-10-CM | POA: Diagnosis not present

## 2024-06-26 LAB — RAD ONC ARIA SESSION SUMMARY
Course Elapsed Days: 0
Plan Fractions Treated to Date: 1
Plan Prescribed Dose Per Fraction: 0.5 Gy
Plan Total Fractions Prescribed: 6
Plan Total Prescribed Dose: 3 Gy
Reference Point Dosage Given to Date: 0.5 Gy
Reference Point Session Dosage Given: 0.5 Gy
Session Number: 1

## 2024-06-27 ENCOUNTER — Other Ambulatory Visit (HOSPITAL_COMMUNITY): Payer: Self-pay | Admitting: Internal Medicine

## 2024-06-27 ENCOUNTER — Ambulatory Visit

## 2024-06-27 ENCOUNTER — Telehealth: Payer: Self-pay | Admitting: Pharmacy Technician

## 2024-06-27 DIAGNOSIS — R2689 Other abnormalities of gait and mobility: Secondary | ICD-10-CM | POA: Diagnosis not present

## 2024-06-27 DIAGNOSIS — M5459 Other low back pain: Secondary | ICD-10-CM | POA: Diagnosis not present

## 2024-06-27 DIAGNOSIS — M6281 Muscle weakness (generalized): Secondary | ICD-10-CM | POA: Diagnosis not present

## 2024-06-27 DIAGNOSIS — R293 Abnormal posture: Secondary | ICD-10-CM | POA: Diagnosis not present

## 2024-06-27 DIAGNOSIS — Z993 Dependence on wheelchair: Secondary | ICD-10-CM | POA: Diagnosis not present

## 2024-06-27 NOTE — Telephone Encounter (Signed)
 Auth Submission: NO AUTH NEEDED Site of care: Site of care: [facility] Payer: MEDICARE A/B & SUPP Medication & CPT/J Code(s) submitted: Monoferric (Ferrci derisomaltose) [phone] Diagnosis Code: D50.9 Route of submission (phone, fax, portal):  Phone # Fax # Auth type: Buy/Bill PB Units/visits requested: X1 DOSE Reference number:  Approval from: 06/12/24 to 08/09/24

## 2024-06-28 ENCOUNTER — Other Ambulatory Visit: Payer: Self-pay | Admitting: Internal Medicine

## 2024-06-28 ENCOUNTER — Ambulatory Visit
Admission: RE | Admit: 2024-06-28 | Discharge: 2024-06-28 | Disposition: A | Source: Ambulatory Visit | Attending: Radiation Oncology

## 2024-06-28 ENCOUNTER — Other Ambulatory Visit: Payer: Self-pay

## 2024-06-28 ENCOUNTER — Encounter: Payer: Self-pay | Admitting: Internal Medicine

## 2024-06-28 DIAGNOSIS — M1909 Primary osteoarthritis, other specified site: Secondary | ICD-10-CM | POA: Diagnosis not present

## 2024-06-28 DIAGNOSIS — M150 Primary generalized (osteo)arthritis: Secondary | ICD-10-CM | POA: Diagnosis not present

## 2024-06-28 DIAGNOSIS — Z510 Encounter for antineoplastic radiation therapy: Secondary | ICD-10-CM | POA: Diagnosis not present

## 2024-06-28 LAB — RAD ONC ARIA SESSION SUMMARY
Course Elapsed Days: 2
Plan Fractions Treated to Date: 2
Plan Prescribed Dose Per Fraction: 0.5 Gy
Plan Total Fractions Prescribed: 6
Plan Total Prescribed Dose: 3 Gy
Reference Point Dosage Given to Date: 1 Gy
Reference Point Session Dosage Given: 0.5 Gy
Session Number: 2

## 2024-06-28 MED ORDER — INSULIN GLARGINE-YFGN 100 UNIT/ML ~~LOC~~ SOPN: 20.0000 [IU] | PEN_INJECTOR | Freq: Two times a day (BID) | SUBCUTANEOUS | 3 refills | Status: DC

## 2024-06-29 ENCOUNTER — Encounter: Payer: Self-pay | Admitting: Podiatry

## 2024-06-29 ENCOUNTER — Ambulatory Visit (HOSPITAL_COMMUNITY)
Admission: RE | Admit: 2024-06-29 | Discharge: 2024-06-29 | Disposition: A | Discharge status: Home / Self Care | Source: Ambulatory Visit | Attending: Internal Medicine | Admitting: Internal Medicine

## 2024-06-29 ENCOUNTER — Ambulatory Visit

## 2024-06-29 ENCOUNTER — Ambulatory Visit (INDEPENDENT_AMBULATORY_CARE_PROVIDER_SITE_OTHER): Admitting: Podiatry

## 2024-06-29 VITALS — BP 116/64 | HR 70 | Temp 98.3°F | Resp 16

## 2024-06-29 DIAGNOSIS — M79675 Pain in left toe(s): Secondary | ICD-10-CM | POA: Diagnosis not present

## 2024-06-29 DIAGNOSIS — M79674 Pain in right toe(s): Secondary | ICD-10-CM

## 2024-06-29 DIAGNOSIS — E1142 Type 2 diabetes mellitus with diabetic polyneuropathy: Secondary | ICD-10-CM | POA: Diagnosis not present

## 2024-06-29 DIAGNOSIS — B351 Tinea unguium: Secondary | ICD-10-CM

## 2024-06-29 DIAGNOSIS — D508 Other iron deficiency anemias: Secondary | ICD-10-CM | POA: Insufficient documentation

## 2024-06-29 MED ORDER — SODIUM CHLORIDE 0.9 % IV SOLN
1000.0000 mg | Freq: Once | INTRAVENOUS | Status: AC
  Administered 2024-06-29: 1000 mg via INTRAVENOUS
  Filled 2024-06-29: qty 1000

## 2024-06-29 NOTE — Progress Notes (Signed)
 This patient returns to my office for at risk foot care.  This patient requires this care by a professional since this patient will be at risk due to having peripheral neuropathy. This patient is unable to cut nails herself since the patient cannot reach her nails.These nails are painful walking and wearing shoes.  This patient presents for at risk foot care today. Previous Charcot foot treated by [employer].  General Appearance  Alert, conversant and in no acute stress.  Vascular  Dorsalis pedis and posterior tibial  pulses are weakly  palpable  bilaterally.  Capillary return is within normal limits  bilaterally. Cold feet.  bilaterally.  Neurologic  Senn-Weinstein monofilament wire test diminished  bilaterally. Muscle power within normal limits bilaterally.  Nails Thick disfigured discolored nails with subungual debris  from hallux to fifth toes bilaterally. No evidence of bacterial infection or drainage bilaterally.  Orthopedic  No limitations of motion  feet .  No crepitus or effusions noted.  No bony pathology or digital deformities noted. Charcot foot left foot.  Skin  normotropic skin with no porokeratosis noted bilaterally.  No signs of infections or ulcers noted. Hemorrhagic lesion left plantar foot.    Onychomycosis  Pain in right toes  Pain in left toes  Consent was obtained for treatment procedures.   Mechanical debridement of nails 1-5  bilaterally performed with a nail nipper.  Filed with dremel without incident. Filed lesion with a dremel tool.   Return office visit    3 months                  Told patient to return for periodic foot care and evaluation due to potential at risk complications.   Cordella Bold DPM

## 2024-06-30 ENCOUNTER — Telehealth: Payer: Self-pay

## 2024-06-30 ENCOUNTER — Other Ambulatory Visit (HOSPITAL_COMMUNITY): Payer: Self-pay

## 2024-06-30 ENCOUNTER — Other Ambulatory Visit: Payer: Self-pay

## 2024-06-30 ENCOUNTER — Ambulatory Visit
Admission: RE | Admit: 2024-06-30 | Discharge: 2024-06-30 | Disposition: A | Discharge status: Home / Self Care | Source: Ambulatory Visit | Attending: Radiation Oncology | Admitting: Radiation Oncology

## 2024-06-30 DIAGNOSIS — R293 Abnormal posture: Secondary | ICD-10-CM | POA: Diagnosis not present

## 2024-06-30 DIAGNOSIS — M150 Primary generalized (osteo)arthritis: Secondary | ICD-10-CM | POA: Diagnosis not present

## 2024-06-30 DIAGNOSIS — R2689 Other abnormalities of gait and mobility: Secondary | ICD-10-CM | POA: Diagnosis not present

## 2024-06-30 DIAGNOSIS — M5459 Other low back pain: Secondary | ICD-10-CM | POA: Diagnosis not present

## 2024-06-30 DIAGNOSIS — M1909 Primary osteoarthritis, other specified site: Secondary | ICD-10-CM | POA: Diagnosis not present

## 2024-06-30 DIAGNOSIS — Z993 Dependence on wheelchair: Secondary | ICD-10-CM | POA: Diagnosis not present

## 2024-06-30 DIAGNOSIS — M6281 Muscle weakness (generalized): Secondary | ICD-10-CM | POA: Diagnosis not present

## 2024-06-30 DIAGNOSIS — Z510 Encounter for antineoplastic radiation therapy: Secondary | ICD-10-CM | POA: Diagnosis not present

## 2024-06-30 LAB — RAD ONC ARIA SESSION SUMMARY
Course Elapsed Days: 4
Plan Fractions Treated to Date: 3
Plan Prescribed Dose Per Fraction: 0.5 Gy
Plan Total Fractions Prescribed: 6
Plan Total Prescribed Dose: 3 Gy
Reference Point Dosage Given to Date: 1.5 Gy
Reference Point Session Dosage Given: 0.5 Gy
Session Number: 3

## 2024-06-30 NOTE — Telephone Encounter (Signed)
 Pharmacy Patient Advocate Encounter   Received notification from Onbase that prior authorization for SEMGLEE  is required/requested.   Insurance verification completed.   The patient is insured through [employer].   Per test claim: PA required; PA submitted to above mentioned insurance via Fax Key/confirmation #/EOC [id number] Status is pending

## 2024-06-30 NOTE — Telephone Encounter (Signed)
 Pharmacy Patient Advocate Encounter  Received notification from [employer] that Prior Authorization for SEMGLEE   has been APPROVED from 06/30/24 to 08/09/2098

## 2024-07-03 ENCOUNTER — Ambulatory Visit
Admission: RE | Admit: 2024-07-03 | Discharge: 2024-07-03 | Disposition: A | Discharge status: Home / Self Care | Source: Ambulatory Visit | Attending: Radiation Oncology | Admitting: Radiation Oncology

## 2024-07-03 ENCOUNTER — Other Ambulatory Visit: Payer: Self-pay

## 2024-07-03 DIAGNOSIS — Z510 Encounter for antineoplastic radiation therapy: Secondary | ICD-10-CM | POA: Diagnosis not present

## 2024-07-03 DIAGNOSIS — M1909 Primary osteoarthritis, other specified site: Secondary | ICD-10-CM | POA: Diagnosis not present

## 2024-07-03 DIAGNOSIS — M150 Primary generalized (osteo)arthritis: Secondary | ICD-10-CM | POA: Diagnosis not present

## 2024-07-03 LAB — RAD ONC ARIA SESSION SUMMARY
Course Elapsed Days: 7
Plan Fractions Treated to Date: 4
Plan Prescribed Dose Per Fraction: 0.5 Gy
Plan Total Fractions Prescribed: 6
Plan Total Prescribed Dose: 3 Gy
Reference Point Dosage Given to Date: 2 Gy
Reference Point Session Dosage Given: 0.5 Gy
Session Number: 4

## 2024-07-05 ENCOUNTER — Ambulatory Visit
Admission: RE | Admit: 2024-07-05 | Discharge: 2024-07-05 | Disposition: A | Source: Ambulatory Visit | Attending: Radiation Oncology

## 2024-07-05 ENCOUNTER — Other Ambulatory Visit: Payer: Self-pay

## 2024-07-05 DIAGNOSIS — Z510 Encounter for antineoplastic radiation therapy: Secondary | ICD-10-CM | POA: Diagnosis not present

## 2024-07-05 DIAGNOSIS — M150 Primary generalized (osteo)arthritis: Secondary | ICD-10-CM | POA: Diagnosis not present

## 2024-07-05 DIAGNOSIS — M1909 Primary osteoarthritis, other specified site: Secondary | ICD-10-CM | POA: Diagnosis not present

## 2024-07-05 LAB — RAD ONC ARIA SESSION SUMMARY
Course Elapsed Days: 9
Plan Fractions Treated to Date: 5
Plan Prescribed Dose Per Fraction: 0.5 Gy
Plan Total Fractions Prescribed: 6
Plan Total Prescribed Dose: 3 Gy
Reference Point Dosage Given to Date: 2.5 Gy
Reference Point Session Dosage Given: 0.5 Gy
Session Number: 5

## 2024-07-07 ENCOUNTER — Encounter (INDEPENDENT_AMBULATORY_CARE_PROVIDER_SITE_OTHER): Payer: Self-pay

## 2024-07-07 ENCOUNTER — Ambulatory Visit

## 2024-07-10 ENCOUNTER — Other Ambulatory Visit: Payer: Self-pay

## 2024-07-10 ENCOUNTER — Ambulatory Visit

## 2024-07-10 ENCOUNTER — Ambulatory Visit
Admission: RE | Admit: 2024-07-10 | Discharge: 2024-07-10 | Disposition: A | Source: Ambulatory Visit | Attending: Radiation Oncology

## 2024-07-10 DIAGNOSIS — M150 Primary generalized (osteo)arthritis: Secondary | ICD-10-CM | POA: Diagnosis not present

## 2024-07-10 DIAGNOSIS — M1909 Primary osteoarthritis, other specified site: Secondary | ICD-10-CM | POA: Insufficient documentation

## 2024-07-10 DIAGNOSIS — Z510 Encounter for antineoplastic radiation therapy: Secondary | ICD-10-CM | POA: Diagnosis present

## 2024-07-10 LAB — RAD ONC ARIA SESSION SUMMARY
Course Elapsed Days: 14
Plan Fractions Treated to Date: 6
Plan Prescribed Dose Per Fraction: 0.5 Gy
Plan Total Fractions Prescribed: 6
Plan Total Prescribed Dose: 3 Gy
Reference Point Dosage Given to Date: 3 Gy
Reference Point Session Dosage Given: 0.5 Gy
Session Number: 6

## 2024-07-11 NOTE — Radiation Completion Notes (Signed)
 Patient Name: [name] [name], [name] [name] MRN: [id number] Date of Birth: 06/17/1946 Referring Physician: GLADE HOPE, M.D. Date of Service: 2024-07-11 Radiation Oncologist: Adina Barge, M.D. [facility]                             RADIATION ONCOLOGY END OF TREATMENT NOTE     Diagnosis: M15.0 Primary generalized (osteo)arthritis Intent: Curative     ==========DELIVERED PLANS==========  First Treatment Date: 2024-06-26 Last Treatment Date: 2024-07-10   Plan Name: Spine Site: Lumbar Spine Technique: 3D Mode: Photon Dose Per Fraction: 0.5 Gy Prescribed Dose (Delivered / Prescribed): 3 Gy / 3 Gy Prescribed Fxs (Delivered / Prescribed): 6 / 6     ==========ON TREATMENT VISIT DATES========== 2024-07-05     ==========UPCOMING VISITS========== 09/29/2024 [facility] DIABETIC FT CARE Loreda Hacker, [facility]  09/21/2024 [facility] OFFICE VISIT Hope Glade PARAS, MD  08/18/2024 [facility] CARE [facility] OFFICE VISIT - LINZIE Jude Harden LULLA, MD  08/04/2024 [facility] NEW PATIENT Guadlupe Lianne DASEN, MD  07/25/2024 [facility] OFFICE VISIT O'Neal, Darryle Ned, MD        ==========APPENDIX - ON TREATMENT VISIT NOTES==========   See weekly On Treatment Notes in Epic for details in the Media tab (listed as Progress notes on the On Treatment Visit Dates listed above).

## 2024-07-12 ENCOUNTER — Ambulatory Visit: Admitting: Internal Medicine

## 2024-07-13 ENCOUNTER — Ambulatory Visit (HOSPITAL_BASED_OUTPATIENT_CLINIC_OR_DEPARTMENT_OTHER): Admitting: Pulmonary Disease

## 2024-07-13 DIAGNOSIS — L4059 Other psoriatic arthropathy: Secondary | ICD-10-CM | POA: Diagnosis not present

## 2024-07-15 NOTE — Progress Notes (Signed)
 Cardiology Office Note:  .   Date:  07/25/2024  ID:  [name] [name], DOB Dec 08, 1945, MRN [id number] PCP: Geofm Glade PARAS, MD  [facility] Providers Cardiologist:  Debby Sor, MD (Inactive)   History of Present Illness: .    Chief Complaint  Patient presents with   Follow-up    [name] [name] is a 78 y.o. female with below history who presents for follow-up.   History of Present Illness   [name] [name] is a 78 year old female who presents for follow-up. She was referred by Dr. Sor for continued management of her conditions.  She has a history of hypertension, previously managed with lisinopril  25 mg, which was adjusted to lisinopril  20 mg and hydrochlorothiazide  12.5 mg due to low blood pressure readings. She also takes carvedilol  6.25 mg twice daily. No chest pain, shortness of breath, or palpitations.  She has obstructive sleep apnea with approximately thirty incidents per hour and is now under the care of Dr. Kalman for this condition.  She is prediabetic and is on insulin  therapy. Her A1c is well-controlled.  She suffers from significant arthritis and degenerative disc disease, causing severe pain, particularly in her lower back where two muscles have died. This pain limits her physical activity, and she has not found relief from land or aqua therapy. She is under the care of Dr. Bonner for pain management.  She has a history of hyperlipidemia, managed with rosuvastatin  5 mg daily, resulting in excellent cholesterol levels.  Her family history is significant for heart disease, affecting her mother, father, and grandmothers.  She is a retired [employer], never smoked, and consumes alcohol  very rarely. She is single with two children and two grandchildren.           Problem List HTN OSA DVT Obesity Psoriatic arthritis  RBBB HLD -T chol 134, HDL 52, LDL 52, TG 150    ROS: All other ROS reviewed and negative. Pertinent positives noted in the HPI.      Studies Reviewed: SABRA       TTE 02/06/2020  1. Left ventricular ejection fraction, by estimation, is 65 to 70%. The  left ventricle has normal function. The left ventricle has no regional  wall motion abnormalities. Left ventricular diastolic parameters are  consistent with Grade I diastolic  dysfunction (impaired relaxation). The average left ventricular global  longitudinal strain is -22.6 %.   2. Right ventricular systolic function is normal. The right ventricular  size is normal. There is normal pulmonary artery systolic pressure.   3. The mitral valve is normal in structure. Mild mitral valve  regurgitation. No evidence of mitral stenosis.   4. The aortic valve is grossly normal. Aortic valve regurgitation is not  visualized. No aortic stenosis is present.    Physical Exam:   VS:  BP 120/62 (BP Location: Left Arm, Patient Position: Sitting, Cuff Size: Normal)   Pulse 79   Ht 5' 6 (1.676 m)   Wt 195 lb (88.5 kg)   SpO2 97%   BMI 31.47 kg/m    Wt Readings from Last 3 Encounters:  07/25/24 195 lb (88.5 kg)  04/13/24 204 lb (92.5 kg)  03/20/24 210 lb (95.3 kg)    GEN: Well nourished, well developed in no acute distress NECK: No JVD; No carotid bruits CARDIAC: RRR, no murmurs, rubs, gallops RESPIRATORY:  Clear to auscultation without rales, wheezing or rhonchi  ABDOMEN: Soft, non-tender, non-distended EXTREMITIES:  No edema; No deformity  ASSESSMENT AND  PLAN: .   Assessment and Plan    Obstructive sleep apnea Severe obstructive sleep apnea with 30 apneic episodes per hour. Managed by Dr. Kalman. - Continue current treatment. - Follow-up with Dr. Kalman.  Essential hypertension Hypertension well-controlled with current medication regimen. - Continue lisinopril  20 mg, hydrochlorothiazide  12.5 mg, and carvedilol  6.25 mg BID.  Hyperlipidemia LDL cholesterol at 52 mg/dL, indicating excellent control with rosuvastatin . - Continue rosuvastatin  5 mg daily.                 Follow-up: Return in about 1 year (around 07/25/2025).  Signed, Darryle DASEN. Barbaraann, MD, [facility]  [facility]  [address] [place], [place] [address] [phone]  3:39 PM

## 2024-07-18 DIAGNOSIS — H6121 Impacted cerumen, right ear: Secondary | ICD-10-CM | POA: Diagnosis not present

## 2024-07-24 ENCOUNTER — Other Ambulatory Visit: Payer: Self-pay | Admitting: Internal Medicine

## 2024-07-25 ENCOUNTER — Ambulatory Visit: Attending: Cardiovascular Disease | Admitting: Cardiovascular Disease

## 2024-07-25 ENCOUNTER — Encounter: Payer: Self-pay | Admitting: Cardiovascular Disease

## 2024-07-25 VITALS — BP 120/62 | HR 79 | Ht 66.0 in | Wt 195.0 lb

## 2024-07-25 DIAGNOSIS — E785 Hyperlipidemia, unspecified: Secondary | ICD-10-CM | POA: Diagnosis not present

## 2024-07-25 DIAGNOSIS — G4733 Obstructive sleep apnea (adult) (pediatric): Secondary | ICD-10-CM

## 2024-07-25 DIAGNOSIS — I10 Essential (primary) hypertension: Secondary | ICD-10-CM | POA: Diagnosis present

## 2024-07-25 DIAGNOSIS — I4510 Unspecified right bundle-branch block: Secondary | ICD-10-CM | POA: Diagnosis present

## 2024-07-25 NOTE — Patient Instructions (Signed)
 Medication Instructions:  Your physician recommends that you continue on your current medications as directed. Please refer to the Current Medication list given to you today.  *If you need a refill on your cardiac medications before your next appointment, please call your pharmacy*  Follow-Up: At [facility], you and your health needs are our priority.  As part of our continuing mission to provide you with exceptional heart care, our providers are all part of one team.  This team includes your primary Cardiologist (physician) and Advanced Practice Providers or APPs (Physician Assistants and Nurse Practitioners) who all work together to provide you with the care you need, when you need it.  Your next appointment:   1 year(s)  Provider:   One of our Advanced Practice Providers (APPs): Morse Clause, PA-C  Lamarr Satterfield, NP Miriam Shams, NP  Olivia Pavy, PA-C Josefa Beauvais, NP  Leontine Salen, PA-C Orren Fabry, PA-C  [place], PA-C Ernest Dick, NP  Damien Braver, NP Jon Hails, PA-C  Waddell Donath, PA-C    Dayna Dunn, PA-C  Scott Weaver, PA-C Lum Louis, NP Katlyn West, NP Callie Goodrich, PA-C  Xika Zhao, NP Sheng Haley, PA-C    Kathleen Johnson, PA-C

## 2024-08-02 ENCOUNTER — Other Ambulatory Visit: Payer: Self-pay | Admitting: Internal Medicine

## 2024-08-04 ENCOUNTER — Encounter: Payer: Self-pay | Admitting: Internal Medicine

## 2024-08-04 ENCOUNTER — Ambulatory Visit: Admitting: Obstetrics

## 2024-08-04 ENCOUNTER — Encounter: Payer: Self-pay | Admitting: Obstetrics

## 2024-08-04 DIAGNOSIS — R351 Nocturia: Secondary | ICD-10-CM | POA: Insufficient documentation

## 2024-08-04 DIAGNOSIS — R159 Full incontinence of feces: Secondary | ICD-10-CM | POA: Insufficient documentation

## 2024-08-04 NOTE — Progress Notes (Deleted)
 " New Patient Evaluation and Consultation  Referring Provider: Geofm Glade PARAS, MD PCP: Geofm Glade PARAS, MD Date of Service: 08/04/2024  SUBJECTIVE Chief Complaint: No chief complaint on file.  History of Present Illness: [name] [name] is a 78 y.o. White or Caucasian female seen in consultation at the request of Dr Geofm for evaluation of urinary incontinence.    Urinary leakage and fecal urgency increased since cholecystectomy and worsening constipation Referred to pelvic floor PT by GI in the past without relief after 2 sessions. Last recommended daytime fiber supplementation and holding for aqua therapy, reduce Imodium  due to concerns of overflow fecal incontinence.  Metamucil use resulted in diarrhea and miralax  increased bowel frequency Using Colestipol  and bedesonide daily with Zofran  PRN Imodium  every 2 weeks  Microscopic colitis, gallstones s/p cholecystectomy complicated by bile leak managed by Dr. Federico last seen 11/05/23  Low back pain due to degenerative disc disease, no relief from aqua therapy 2x/week. Pain managed by Dr. 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   EXAM: ABDOMEN - 2 VIEW   COMPARISON:  05/06/2023.   FINDINGS: The bowel gas pattern is normal without gaseous distention. No free intraperitoneal air. No radio-opaque calculi or other significant radiographic abnormality are seen. Increased stool noted consistent with constipation.   IMPRESSION: Constipation.     Electronically Signed   By: Fonda Field M.D.   On: 11/14/2023 01:48  Urinary Symptoms: Leaks urine with cough/ sneeze, laughing, going from sitting to standing, with movement to the bathroom, with urgency, and without sensation Leaks *** time(s) per days.  Pad use: 2  adult diapers per day.   Patient is bothered by UI symptoms.  Day time voids ***.  Nocturia: 1-2 times per night to void. Sleep apnea on BiPAP managed by Dr. Kalman Voiding dysfunction:  empties bladder well.  Patient does not use a catheter to empty bladder.  When urinating, patient feels a weak stream and difficulty starting urine stream Drinks: ***oz water  per day  UTIs: 0 UTI's in the last year.   {ACTIONS;DENIES/REPORTS:21021675::Denies} history of blood in urine, kidney or bladder stones, pyelonephritis, bladder cancer, and kidney cancer No results found for the last 90 days.   Pelvic Organ Prolapse Symptoms:                  Patient Denies a feeling of a bulge the vaginal area.   Bowel Symptom: Bowel movements: 1-2 time(s) per {Time; day/week/month:13537} on probiotics Stool consistency: hard Straining: yes.  Splinting: no.  Incomplete evacuation: no.  Patient Admits to accidental bowel leakage / fecal incontinence  Occurs: *** time(s) per {Time; day/week/month:13537}  Consistency with leakage: solid Bowel regimen: stool softener Last colonoscopy: Results ***9 polyps removed [facility] Colonoscopy          Completed or No Longer Recommended     Colonoscopy  Discontinued      Frequency changed to Never automatically (Topic No Longer Applies)   09/17/2021  COLONOSCOPY  Only the first 1 history entries have been loaded, but more history exists.               Sexual Function Sexually active: no.  Sexual orientation: Straight Pain with sex: {pain with sex:24762}  Pelvic Pain Denies pelvic pain   Past Medical History:  Past Medical History:  Diagnosis Date  Anemia    Anxiety    Arthritis    Closed fracture of fifth metatarsal bone 05/13/2018   Deep vein thrombosis (HCC)    right calf - 05/2012    Depression    Diabetes mellitus without complication (HCC)    diet controlled    GERD (gastroesophageal reflux disease)    Hyperlipidemia    Patient denies.    Hypertension    Ejection fraction =>55% Left ventricular systolic function is normal. Left ventricular wall motion is normal     Lymphocytic colitis    Neuropathy    diabetic - in bilateral feet    Pityriasis lichenoides chronica    Pneumonia    Sleep apnea    bipap     Past Surgical History:   Past Surgical History:  Procedure Laterality Date   BILIARY STENT PLACEMENT  12/28/2022   Procedure: BILIARY STENT PLACEMENT;  Surgeon: Avram Lupita BRAVO, MD;  Location: [facility] ENDOSCOPY;  Service: Gastroenterology;;   BIOPSY  11/09/2022   Procedure: BIOPSY;  Surgeon: Wilhelmenia Aloha Raddle., MD;  Location: [facility];  Service: Gastroenterology;;   CHOLECYSTECTOMY N/A 12/23/2022   Procedure: LAPAROSCOPIC CHOLECYSTECTOMY WITH ICG DYE;  Surgeon: Tanda Locus, MD;  Location: [facility];  Service: General;  Laterality: N/A;   DILATION AND CURETTAGE OF UTERUS     ENDOSCOPIC RETROGRADE CHOLANGIOPANCREATOGRAPHY (ERCP) WITH PROPOFOL  N/A 12/28/2022   Procedure: ENDOSCOPIC RETROGRADE CHOLANGIOPANCREATOGRAPHY (ERCP) WITH PROPOFOL ;  Surgeon: Avram Lupita BRAVO, MD;  Location: [facility] ENDOSCOPY;  Service: Gastroenterology;  Laterality: N/A;   ENDOSCOPIC RETROGRADE CHOLANGIOPANCREATOGRAPHY (ERCP) WITH PROPOFOL  N/A 02/22/2023   Procedure: ENDOSCOPIC RETROGRADE CHOLANGIOPANCREATOGRAPHY (ERCP) WITH PROPOFOL ;  Surgeon: Avram Lupita BRAVO, MD;  Location: [facility];  Service: Gastroenterology;  Laterality: N/A;   ESOPHAGOGASTRODUODENOSCOPY N/A 11/09/2022   Procedure: ESOPHAGOGASTRODUODENOSCOPY (EGD);  Surgeon: Wilhelmenia Aloha Raddle., MD;  Location: [facility];  Service: Gastroenterology;  Laterality: N/A;   EYE SURGERY  07/2020   GASTROINTESTINAL STENT REMOVAL N/A 02/22/2023   Procedure: GASTROINTESTINAL STENT REMOVAL;  Surgeon: Avram Lupita BRAVO, MD;  Location: [facility];  Service: Gastroenterology;  Laterality: N/A;   HAMMER TOE SURGERY     right hand surgery      due to blood infection    torn meniscus repair      right knee     TOTAL HIP ARTHROPLASTY  09/13/2012   Procedure: TOTAL HIP ARTHROPLASTY ANTERIOR APPROACH;  Surgeon: Donnice JONETTA Car, MD;  Location: [facility];  Service: Orthopedics;  Laterality: Right;   UPPER ESOPHAGEAL ENDOSCOPIC ULTRASOUND (EUS) N/A 11/09/2022   Procedure: UPPER ESOPHAGEAL ENDOSCOPIC ULTRASOUND (EUS);  Surgeon: Wilhelmenia Aloha Raddle., MD;  Location: [facility];  Service: Gastroenterology;  Laterality: N/A;     Past OB/GYN History: OB History  No obstetric history on file.    Vaginal deliveries: ***,  Forceps/ Vacuum deliveries: ***, Cesarean section: *** Menopausal: Yes, at age 30, Denies vaginal bleeding since menopause Contraception: ***. Last pap smear was ***.  Any history of abnormal pap smears: no. No results found for: DIAGPAP, HPVHIGH, ADEQPAP  Medications: Patient has a current medication list which includes the following prescription(s): acetaminophen , acyclovir  ointment, allopurinol , alprazolam , augmented betamethasone dipropionate, azelastine  hcl, biotin, accu-chek guide, budesonide , carvedilol , cholecalciferol , ciclopirox, clobetasol cream, colestipol , cyanocobalamin , cyclobenzaprine , fluocinolone acetonide, folic acid , insulin  glargine-yfgn, lisinopril -hydrochlorothiazide , melatonin, metformin , methotrexate , metronidazole , mupirocin  ointment, omeprazole , ondansetron , oxycodone -acetaminophen , polyethylene glycol, potassium chloride , potassium chloride , probiotic product, rosuvastatin , and sertraline .   Allergies: Patient is allergic to other, thorazine  [chlorpromazine ], bee venom, farxiga  [dapagliflozin ], fish allergy, semaglutide , and sulfa antibiotics.  Social History: Social History[1]  Relationship status: long-term partner Patient lives with her significant other.   Patient is not employed. Regular exercise: No History of abuse: No  Family History:   Family History  Problem Relation Age of Onset   Heart disease Mother    Hypertension Mother    Heart  disease Father      Review of Systems: Review of Systems  Constitutional:  Positive for malaise/fatigue. Negative for fever and weight loss.  Respiratory:  Negative for cough, shortness of breath and wheezing.   Cardiovascular:  Negative for chest pain, palpitations and leg swelling.  Gastrointestinal:  Positive for constipation. Negative for abdominal pain and blood in stool.  Genitourinary:  Positive for frequency and urgency. Negative for dysuria and hematuria.       Leakage  Skin:  Negative for rash.  Neurological:  Positive for weakness. Negative for dizziness and headaches.  Endo/Heme/Allergies:  Bruises/bleeds easily.  Psychiatric/Behavioral:  Positive for depression. The patient is nervous/anxious.      OBJECTIVE Physical Exam: There were no vitals filed for this visit.  Physical Exam Constitutional:      General: She is not in acute distress.    Appearance: Normal appearance.  Genitourinary:     Bladder and urethral meatus normal.     No lesions in the vagina.     Right Labia: No rash, tenderness, lesions, skin changes or Bartholin's cyst.    Left Labia: No tenderness, lesions, skin changes, Bartholin's cyst or rash.    No vaginal discharge, erythema, tenderness, bleeding, ulceration or granulation tissue.      Right Adnexa: not tender, not full and no mass present.    Left Adnexa: not tender, not full and no mass present.    No cervical motion tenderness, discharge, friability, lesion, polyp or nabothian cyst.     Uterus is not enlarged, fixed, tender or irregular.     No uterine mass detected.    Urethral meatus caruncle not present.    No urethral prolapse, tenderness, mass, hypermobility or discharge present.     Bladder is not tender, urgency on palpation not present and masses not present.      Levator ani not tender, obturator internus not tender, no asymmetrical contractions present and no pelvic spasms present.    Anal wink present and BC reflex  present. Cardiovascular:     Rate and Rhythm: Normal rate.  Pulmonary:     Effort: Pulmonary effort is normal. No respiratory distress.  Abdominal:     General: There is no distension.     Palpations: There is no mass.     Tenderness: There is no abdominal tenderness.     Hernia: No hernia is present.  Neurological:     Mental Status: She is alert.  Vitals reviewed. Exam conducted with a chaperone present.      POP-Q:   POP-Q                                               Aa                                               Ba  C                                                Gh                                               Pb                                               tvl                                                Ap                                               Bp                                                 D      Rectal Exam:  Normal sphincter tone, {rectocele:24766} distal rectocele, enterocoele {DESC; PRESENT/NOT PRESENT:21021351}, no rectal masses, {sign of:24767} dyssynergia when asking the patient to bear down.  Post-Void Residual (PVR) by Bladder Scan: In order to evaluate bladder emptying, we discussed obtaining a postvoid residual and patient agreed to this procedure.  Procedure: The ultrasound unit was placed on the patient's abdomen in the suprapubic region after the patient had voided.      Laboratory Results: Lab Results  Component Value Date   BILIRUBINUR NEGATIVE 07/02/2023   PROTEINUR NEGATIVE 02/10/2023   UROBILINOGEN 0.2 07/02/2023   LEUKOCYTESUR TRACE (A) 07/02/2023    Lab Results  Component Value Date   CREATININE 0.94 03/20/2024   CREATININE 1.23 (H) 09/20/2023   CREATININE 0.94 07/02/2023    Lab Results  Component Value Date   HGBA1C 6.2 03/20/2024    Lab Results  Component Value Date   HGB 10.8 (L) 03/20/2024     ASSESSMENT AND PLAN Ms. Acocella is a 78 y.o.  with: No diagnosis found.  There are no diagnoses linked to this encounter.   Lianne ONEIDA Gillis, MD        [1]  Social History Tobacco Use   Smoking status: Never    Passive exposure: Never   Smokeless tobacco: Never  Vaping Use   Vaping status: Never Used  Substance Use Topics   Alcohol  use: Yes    Alcohol /week: 1.0 standard drink of alcohol     Types: 1 Glasses of wine per week    Comment: occasional wine    Drug use: No   "

## 2024-08-06 ENCOUNTER — Other Ambulatory Visit: Payer: Self-pay | Admitting: Internal Medicine

## 2024-08-08 ENCOUNTER — Ambulatory Visit: Admitting: Obstetrics

## 2024-08-08 ENCOUNTER — Encounter: Payer: Self-pay | Admitting: Obstetrics

## 2024-08-08 VITALS — BP 129/70 | HR 81 | Ht 63.58 in | Wt 203.6 lb

## 2024-08-08 DIAGNOSIS — R159 Full incontinence of feces: Secondary | ICD-10-CM

## 2024-08-08 DIAGNOSIS — N3946 Mixed incontinence: Secondary | ICD-10-CM

## 2024-08-08 DIAGNOSIS — N952 Postmenopausal atrophic vaginitis: Secondary | ICD-10-CM | POA: Insufficient documentation

## 2024-08-08 DIAGNOSIS — R351 Nocturia: Secondary | ICD-10-CM

## 2024-08-08 LAB — POCT URINALYSIS DIP (CLINITEK)
Bilirubin, UA: NEGATIVE
Blood, UA: NEGATIVE
Glucose, UA: NEGATIVE mg/dL
Ketones, POC UA: NEGATIVE mg/dL
Leukocytes, UA: NEGATIVE
Nitrite, UA: NEGATIVE
POC PROTEIN,UA: NEGATIVE
Spec Grav, UA: 1.015
Urobilinogen, UA: 0.2 U/dL
pH, UA: 6

## 2024-08-08 MED ORDER — ESTRADIOL 0.01 % VA CREA: TOPICAL_CREAM | VAGINAL | 3 refills | Status: AC

## 2024-08-08 MED ORDER — MIRABEGRON ER 25 MG PO TB24: 25.0000 mg | ORAL_TABLET | Freq: Every day | ORAL | 0 refills | Status: AC

## 2024-08-08 NOTE — Patient Instructions (Addendum)
 We discussed the symptoms of overactive bladder (OAB), which include urinary urgency, urinary frequency, night-time urination, with or without urge incontinence.  We discussed management including behavioral therapy (decreasing bladder irritants by following a bladder diet, urge suppression strategies, timed voids, bladder retraining), physical therapy, medication; and for refractory cases posterior tibial nerve stimulation, sacral neuromodulation, and intravesical botulinum toxin injection.   For Beta-3 agonist medication, we discussed the potential side effect of elevated blood pressure which is more likely to occur in individuals with uncontrolled hypertension. You were given prescription for Myrbetriq 25 mg.  For Beta-3 agonist medication,there is a potential side effect of elevated blood pressure which is more likely to occur in individuals with uncontrolled hypertension. It appears that your most recent blood pressure is within normal limits 120s/70s. Please monitor your blood pressure and stop the medication if you experience any headache, chest discomfort, or shortness of breath and seek care immediately.  I have sent your prescription of mirabegron to your pharmacy. Start at 25mg  daily for 1 month, if your blood pressure remains unchanged, increase to 50mg  after 1 month and continue to monitor your blood pressure.  For night time frequency: - avoid fluid intake 3 hours before bedtime - elevated your feet during the day or use compression socks to reduce lower extremity swelling - switch your diuretic (e.g. hydrochlorothiazide ) dosing to 2pm - continue biPAP for sleep apnea  For treatment of stress urinary incontinence, which is leakage with physical activity/movement/strainging/coughing, we discussed expectant management versus nonsurgical options versus surgery. Nonsurgical options include weight loss, physical therapy, as well as a pessary.  Surgical options include a midurethral sling,  which is a synthetic mesh sling that acts like a hammock under the urethra to prevent leakage of urine, a Burch urethropexy, and transurethral injection of a bulking agent.   For vaginal atrophy (thinning of the vaginal tissue that can cause dryness and burning) and UTI prevention we discussed estrogen replacement in the form of vaginal cream.   Start vaginal estrogen therapy nightly for two weeks then 2 times weekly at night. This can be placed with your finger or an applicator inside the vagina and around the urethra.  Please let us  know if the prescription is too expensive and we can look for alternative options.   Is vaginal estrogen therapy safe for me? Vaginal estrogen preparations act on the vaginal skin, and only a very tiny amount is absorbed into the bloodstream (0.01%).  They work in a similar way to hand or face cream.  There is minimal absorption and they are therefore perfectly safe. If you have had breast cancer and have persistent troublesome symptoms which aren't settling with vaginal moisturisers and lubricants, local estrogen treatment may be a possibility, but consultation with your oncologist should take place first.   Accidental Bowel Leakage:  - Treatment options include anti-diarrhea medication (loperamide / Imodium  OTC or prescription lomotil), fiber supplements, physical therapy, and possible sacral neuromodulation or surgery.     Constipation: Our goal is to achieve formed bowel movements daily or every-other-day.  You may need to try different combinations of the following options to find what works best for you - everybody's body works differently so feel free to adjust the dosages as needed.  Some options to help maintain bowel health include:  Dietary changes (more leafy greens, vegetables and fruits; less processed foods) Fiber supplementation (Benefiber, FiberCon, Metamucil or Psyllium). Start slow and increase gradually to full dose. Over-the-counter agents such as:  stool softeners (Docusate or Colace)  and/or laxatives (Miralax , milk of magnesia)  Power Pudding is a natural mixture that may help your constipation.  To make blend 1 cup applesauce, 1 cup wheat bran, and 3/4 cup prune juice, refrigerate and then take 1 tablespoon daily with a large glass of water  as needed.   Women should try to eat at least 21 to 25 grams of fiber a day, while men should aim for 30 to 38 grams a day. You can add fiber to your diet with food or a fiber supplement such as psyllium (metamucil), benefiber, or fibercon.   Here's a look at how much dietary fiber is found in some common foods. When buying packaged foods, check the Nutrition Facts label for fiber content. It can vary among brands.  Fruits Serving size Total fiber (grams)*  Raspberries 1 cup 8.0  Pear 1 medium 5.5  Apple, with skin 1 medium 4.5  Banana 1 medium 3.0  Orange 1 medium 3.0  Strawberries 1 cup 3.0   Vegetables Serving size Total fiber (grams)*  Green peas, boiled 1 cup 9.0  Broccoli, boiled 1 cup chopped 5.0  Turnip greens, boiled 1 cup 5.0  Brussels sprouts, boiled 1 cup 4.0  Potato, with skin, baked 1 medium 4.0  Sweet corn, boiled 1 cup 3.5  Cauliflower, raw 1 cup chopped 2.0  Carrot, raw 1 medium 1.5   Grains Serving size Total fiber (grams)*  Spaghetti, whole-wheat, cooked 1 cup 6.0  Barley, pearled, cooked 1 cup 6.0  Bran flakes 3/4 cup 5.5  Quinoa, cooked 1 cup 5.0  Oat bran muffin 1 medium 5.0  Oatmeal, instant, cooked 1 cup 5.0  Popcorn, air-popped 3 cups 3.5  Brown rice, cooked 1 cup 3.5  Bread, whole-wheat 1 slice 2.0  Bread, rye 1 slice 2.0   Legumes, nuts and seeds Serving size Total fiber (grams)*  Split peas, boiled 1 cup 16.0  Lentils, boiled 1 cup 15.5  Black beans, boiled 1 cup 15.0  Baked beans, canned 1 cup 10.0  Chia seeds 1 ounce 10.0  Almonds 1 ounce (23 nuts) 3.5  Pistachios 1 ounce (49 nuts) 3.0  Sunflower kernels 1 ounce 3.0  *Rounded to nearest 0.5  gram. Source: [employer] for Standard Reference, Legacy Release    The origin of pelvic floor muscle spasm can be multifactorial, including primary, reactive to a different pain source, trauma, or even part of a centralized pain syndrome.Treatment options include pelvic floor physical therapy, local (vaginal) or oral  muscle relaxants, pelvic muscle trigger point injections or centrally acting pain medications.    Please call [phone] to schedule the earliest appointment for pelvic floor PT.

## 2024-08-08 NOTE — Assessment & Plan Note (Addendum)
-   Microscopic colitis, s/p cholecystectomy complicated by bile leak managed by Dr. Federico last seen 11/05/23 - Using Colestipol  and bedesonide daily with Zofran  PRN - opioid use for back pain - Treatment options include anti-diarrhea medication (loperamide / Imodium  OTC or prescription lomotil), fiber supplements, physical therapy, and possible sacral neuromodulation or surgery.   - encouraged titration of fiber supplementation and miralax  to optimize stool consistency - encouraged to return to pelvic floor PT

## 2024-08-08 NOTE — Progress Notes (Signed)
 " New Patient Evaluation and Consultation  Referring Provider: Geofm Glade PARAS, MD PCP: Geofm Glade PARAS, MD Date of Service: 08/08/2024  SUBJECTIVE Chief Complaint: New Patient (Initial Visit) [name] [name] is a 78 y.o. female here today for urinary incontinence.)  History of Present Illness: [name] [name] is a 78 y.o. White or Caucasian female seen in consultation at the request of Dr Geofm for evaluation of urinary incontinence.    Urinary leakage started since childbirth Urinary leakage and fecal urgency worsened since cholecystectomy and worsening constipation, however unclear with history since she was very ill  Attributes some symptoms to long COVID and reports low back pain after COVID attributed to muscles dying in her lower back.  Referred to pelvic floor PT by GI in the past without relief after 2 sessions due to sensation of invasive procedure. Last recommended daytime fiber supplementation and holding for aqua therapy, reduce Imodium  due to concerns of overflow fecal incontinence.  Metamucil use resulted in diarrhea and miralax  increased bowel frequency Using Colestipol  and bedesonide daily with Zofran  PRN Imodium  every 2 weeks  Microscopic colitis, laparoscopic cholecystectomy with ICG by Dr. Tanda 12/23/22 complicated by bile leak with placement of biliary stent managed by Dr. Federico last seen 11/05/23  Low back pain due to degenerative disc disease, no relief from aqua therapy 2x/week. Pain managed by Dr. Bonner, underwent ablations and low dose radiation without pain relief. Takes 3 x 10mg  oxycodone /day by NP [place] Takes lisinopril -hydrochlorothiazide  daily Cares for her husband with dementia  Review of records significant for: T2DM with neuropathy on metformin  and insulin , last HbA1C 6.2 in 03/20/24 Psoriatic arthritis, h/o DVT  CLINICAL DATA:  Abdominal pain.   EXAM: ABDOMEN - 2 VIEW   COMPARISON:  05/06/2023.   FINDINGS: The bowel gas pattern is normal  without gaseous distention. No free intraperitoneal air. No radio-opaque calculi or other significant radiographic abnormality are seen. Increased stool noted consistent with constipation.   IMPRESSION: Constipation.     Electronically Signed   By: Fonda Field M.D.   On: 11/14/2023 01:48  Urinary Symptoms: Leaks urine with cough/ sneeze, laughing, going from sitting to standing, with movement to the bathroom, with urgency, and without sensation Leaks 6 time(s) per days with urgency, Leaks 1-2x/week at night with urgency Leaks 2x/day with cough/sneeze Pad use: 2 adult diapers per day since cholecystectomy Patient is bothered by UI symptoms.  Day time voids 6-8.  Nocturia: 1-2 times per night to void. Sleep apnea on BiPAP managed by Dr. Kalman Stops fluid intake when she takes evening medications around 10pm Occasional ankle swelling Voiding dysfunction:  empties bladder well.  Patient does not use a catheter to empty bladder.  When urinating, patient feels a weak stream and difficulty starting urine stream Drinks: 48-64oz water  per day, 8oz of water  with metamucil, 20oz gatorade, 8oz of coffee, occasional hot tea  UTIs: 0 UTI's in the last year.   Denies history of blood in urine, kidney or bladder stones, pyelonephritis, bladder cancer, and kidney cancer No results found for the last 90 days.  Pelvic Organ Prolapse Symptoms:                  Patient Denies a feeling of a bulge the vaginal area.   Bowel Symptom: Bowel movements: 1-2 time(s) per day on probiotics, previously regular daily bowel movement prior to cholecystectomy Stool consistency: hard, Type I stool  Straining: yes.  Splinting: no.  Incomplete evacuation: no.  Patient Admits to accidental bowel  leakage / fecal incontinence  Occurs: 1-2 time(s) per day  Consistency with leakage: solid hard ball of stool Bowel regimen: stool softener, metamucil 2 teaspoons/day Last colonoscopy: Results 12mm polyp in cecum,  7 sessile polyps in transverse and ascending colon, diverticulosis, 7mm polyp in rectum, and internal hemorrhoids HM Colonoscopy          Completed or No Longer Recommended     Colonoscopy  Discontinued      Frequency changed to Never automatically (Topic No Longer Applies)   09/17/2021  COLONOSCOPY  Only the first 1 history entries have been loaded, but more history exists.               Sexual Function Sexually active: no.  Sexual orientation: Straight Pain with sex: No  Pelvic Pain Denies pelvic pain   Past Medical History:  Past Medical History:  Diagnosis Date   Anemia    Anxiety    Arthritis    Closed fracture of fifth metatarsal bone 05/13/2018   Deep vein thrombosis (HCC)    right calf - 05/2012    Depression    Diabetes mellitus without complication (HCC)    diet controlled    GERD (gastroesophageal reflux disease)    Hyperlipidemia    Patient denies.   Hypertension    Ejection fraction =>55% Left ventricular systolic function is normal. Left ventricular wall motion is normal     Lymphocytic colitis    Neuropathy    diabetic - in bilateral feet    Pityriasis lichenoides chronica    Pneumonia    Sleep apnea    bipap     Past Surgical History:   Past Surgical History:  Procedure Laterality Date   BILIARY STENT PLACEMENT  12/28/2022   Procedure: BILIARY STENT PLACEMENT;  Surgeon: Avram Lupita BRAVO, MD;  Location: [facility] ENDOSCOPY;  Service: Gastroenterology;;   BIOPSY  11/09/2022   Procedure: BIOPSY;  Surgeon: Wilhelmenia Aloha Raddle., MD;  Location: [facility];  Service: Gastroenterology;;   CHOLECYSTECTOMY N/A 12/23/2022   Procedure: LAPAROSCOPIC CHOLECYSTECTOMY WITH ICG DYE;  Surgeon: Tanda Locus, MD;  Location: [facility];  Service: General;  Laterality: N/A;   DILATION AND CURETTAGE OF UTERUS     ENDOSCOPIC RETROGRADE CHOLANGIOPANCREATOGRAPHY (ERCP) WITH PROPOFOL  N/A 12/28/2022   Procedure: ENDOSCOPIC RETROGRADE CHOLANGIOPANCREATOGRAPHY (ERCP) WITH  PROPOFOL ;  Surgeon: Avram Lupita BRAVO, MD;  Location: [facility] ENDOSCOPY;  Service: Gastroenterology;  Laterality: N/A;   ENDOSCOPIC RETROGRADE CHOLANGIOPANCREATOGRAPHY (ERCP) WITH PROPOFOL  N/A 02/22/2023   Procedure: ENDOSCOPIC RETROGRADE CHOLANGIOPANCREATOGRAPHY (ERCP) WITH PROPOFOL ;  Surgeon: Avram Lupita BRAVO, MD;  Location: [facility] ENDOSCOPY;  Service: Gastroenterology;  Laterality: N/A;   ESOPHAGOGASTRODUODENOSCOPY N/A 11/09/2022   Procedure: ESOPHAGOGASTRODUODENOSCOPY (EGD);  Surgeon: Wilhelmenia Aloha Raddle., MD;  Location: [facility];  Service: Gastroenterology;  Laterality: N/A;   EYE SURGERY  07/2020   GASTROINTESTINAL STENT REMOVAL N/A 02/22/2023   Procedure: GASTROINTESTINAL STENT REMOVAL;  Surgeon: Avram Lupita BRAVO, MD;  Location: [facility] ENDOSCOPY;  Service: Gastroenterology;  Laterality: N/A;   HAMMER TOE SURGERY     right hand surgery      due to blood infection    torn meniscus repair      right knee    TOTAL HIP ARTHROPLASTY  09/13/2012   Procedure: TOTAL HIP ARTHROPLASTY ANTERIOR APPROACH;  Surgeon: Donnice JONETTA Car, MD;  Location: [facility];  Service: Orthopedics;  Laterality: Right;   UPPER ESOPHAGEAL ENDOSCOPIC ULTRASOUND (EUS) N/A 11/09/2022   Procedure: UPPER ESOPHAGEAL ENDOSCOPIC ULTRASOUND (EUS);  Surgeon: Wilhelmenia Aloha Raddle., MD;  Location:  [facility] ENDOSCOPY;  Service: Gastroenterology;  Laterality: N/A;     Past OB/GYN History: OB History  Gravida Para Term Preterm AB Living  2 2 1 1  2   SAB IAB Ectopic Multiple Live Births      2    # Outcome Date GA Lbr Len/2nd Weight Sex Type Anes PTL Lv  2 Preterm     M Vag-Spont   LIV  1 Term     F Vag-Spont   LIV    Vaginal deliveries: 7lb6oz, denies complications postpartum. Forceps/ Vacuum deliveries: 0, Cesarean section: 0 Menopausal: Yes, at age 1, Denies vaginal bleeding since menopause Contraception: s/p BTL and menopause. Last pap smear was unknown, prior to COVID.  Any history of abnormal pap smears: no. No results found for: DIAGPAP,  HPVHIGH, ADEQPAP  Medications: Patient has a current medication list which includes the following prescription(s): acetaminophen , acyclovir  ointment, allopurinol , alprazolam , augmented betamethasone dipropionate, azelastine  hcl, biotin, accu-chek guide, budesonide , carvedilol , cholecalciferol , ciclopirox, clobetasol cream, colestipol , cyanocobalamin , cyclobenzaprine , [START ON 08/10/2024] estradiol, fluocinolone acetonide, folic acid , insulin  glargine-yfgn, lisinopril -hydrochlorothiazide , melatonin, metformin , methotrexate , metronidazole , mirabegron er, mupirocin  ointment, omeprazole , ondansetron , oxycodone -acetaminophen , polyethylene glycol, potassium chloride , potassium chloride , probiotic product, rosuvastatin , and sertraline .   Allergies: Patient is allergic to chlorpromazine , honey bee venom, other, sulfa antibiotics, bee venom, farxiga  [dapagliflozin ], fish allergy, sodium metabisulfite, and semaglutide .   Social History: Social History[1]  Relationship status: long-term partner Patient lives with her significant other.   Patient is not employed. Regular exercise: No History of abuse: No  Family History:   Family History  Problem Relation Age of Onset   Heart disease Mother    Hypertension Mother    Heart disease Father    Bladder Cancer Neg Hx    Renal cancer Neg Hx    Uterine cancer Neg Hx      Review of Systems: Review of Systems  Constitutional:  Positive for malaise/fatigue. Negative for fever and weight loss.  Respiratory:  Negative for cough, shortness of breath and wheezing.   Cardiovascular:  Negative for chest pain, palpitations and leg swelling.  Gastrointestinal:  Positive for constipation. Negative for abdominal pain and blood in stool.  Genitourinary:  Positive for frequency and urgency. Negative for dysuria and hematuria.       Leakage  Skin:  Negative for rash.  Neurological:  Positive for weakness. Negative for dizziness and headaches.  Endo/Heme/Allergies:   Bruises/bleeds easily.  Psychiatric/Behavioral:  Positive for depression. The patient is nervous/anxious.      OBJECTIVE Physical Exam: Vitals:   08/08/24 1546  BP: 129/70  Pulse: 81  Weight: 203 lb 9.6 oz (92.4 kg)  Height: 5' 3.58 (1.615 m)    Physical Exam Constitutional:      General: She is not in acute distress.    Appearance: Normal appearance.  Genitourinary:     Bladder and urethral meatus normal.     No lesions in the vagina.     Right Labia: No rash, tenderness, lesions, skin changes or Bartholin's cyst.    Left Labia: No tenderness, lesions, skin changes, Bartholin's cyst or rash.    No vaginal discharge, erythema, tenderness, bleeding, ulceration or granulation tissue.     Posterior vaginal prolapse present.    Severe vaginal atrophy present.     Right Adnexa: not tender, not full and no mass present.    Left Adnexa: not tender, not full and no mass present.    No cervical motion tenderness, discharge, friability, lesion, polyp or nabothian cyst.  Uterus is not enlarged, fixed, tender, irregular or prolapsed.     No uterine mass detected.    Urethral meatus caruncle not present.    No urethral prolapse, tenderness, mass, hypermobility, discharge or stress urinary incontinence with cough stress test present.     Bladder is not tender, urgency on palpation not present and masses not present.      Pelvic Floor: Levator muscle strength is 2/5.    Levator ani is tender (left sided) and obturator internus is tender (left sided).     No asymmetrical contractions present and no pelvic spasms present.    Anal wink absent and BC reflex absent.     Symmetrical pelvic sensation. Cardiovascular:     Rate and Rhythm: Normal rate.  Pulmonary:     Effort: Pulmonary effort is normal. No respiratory distress.  Abdominal:     General: There is no distension.     Palpations: Abdomen is soft. There is no mass.     Tenderness: There is no abdominal tenderness.     Hernia:  No hernia is present.   Neurological:     Mental Status: She is alert.  Vitals reviewed. Exam conducted with a chaperone present.      POP-Q:   POP-Q  -3                                            Aa   -3                                           Ba  -8                                              C   2                                            Gh  3                                            Pb  9                                            tvl   -2                                            Ap  -2                                            Bp  -8  D     Post-Void Residual (PVR) by Bladder Scan: In order to evaluate bladder emptying, we discussed obtaining a postvoid residual and patient agreed to this procedure.  Procedure: The ultrasound unit was placed on the patient's abdomen in the suprapubic region after the patient had voided.    Post Void Residual - 08/08/24 1558       Post Void Residual   Post Void Residual 91 mL         Straight Catheterization Procedure for PVR: After verbal consent was obtained from the patient for catheterization to assess bladder emptying and residual volume the urethra and surrounding tissues were prepped with betadine and an in and out catheterization was performed.  PVR was 90mL.  Urine appeared clear yellow. The patient tolerated the procedure well.   Laboratory Results: Lab Results  Component Value Date   COLORU yellow 08/08/2024   CLARITYU clear 08/08/2024   GLUCOSEUR negative 08/08/2024   BILIRUBINUR negative 08/08/2024   SPECGRAV 1.015 08/08/2024   RBCUR negative 08/08/2024   PHUR 6.0 08/08/2024   PROTEINUR NEGATIVE 02/10/2023   UROBILINOGEN 0.2 08/08/2024   LEUKOCYTESUR Negative 08/08/2024    Lab Results  Component Value Date   CREATININE 0.94 03/20/2024   CREATININE 1.23 (H) 09/20/2023   CREATININE 0.94 07/02/2023    Lab Results  Component Value Date    HGBA1C 6.2 03/20/2024    Lab Results  Component Value Date   HGB 10.8 (L) 03/20/2024     ASSESSMENT AND PLAN Ms. Archambault is a 78 y.o. with:  1. Incontinence of feces, unspecified fecal incontinence type   2. Nocturia   3. Mixed stress and urge urinary incontinence   4. Vaginal atrophy     Incontinence of feces, unspecified fecal incontinence type Assessment & Plan: - Microscopic colitis, s/p cholecystectomy complicated by bile leak managed by Dr. Federico last seen 11/05/23 - Using Colestipol  and bedesonide daily with Zofran  PRN - opioid use for back pain - Treatment options include anti-diarrhea medication (loperamide / Imodium  OTC or prescription lomotil), fiber supplements, physical therapy, and possible sacral neuromodulation or surgery.   - encouraged titration of fiber supplementation and miralax  to optimize stool consistency - encouraged to return to pelvic floor PT  Orders: -     AMB referral to rehabilitation  Nocturia Assessment & Plan: - avoid fluid intake 3 hours before bedtime - elevated feet during the day or use compression socks to reduce lower extremity swelling - switch diuretic (e.g. HCTZ) dosing to 2pm - due to snoring, continue biPAP for sleep apnea - trial of Mirabegron 25mg  at bedtime   Orders: -     POCT URINALYSIS DIP (CLINITEK)  Mixed stress and urge urinary incontinence Assessment & Plan: - T2DM with HbA1C at goal, low back pain managed by Dr. Bonner - POCT UA negative, catheterized for 90mL - We discussed the symptoms of overactive bladder (OAB), which include urinary urgency, urinary frequency, nocturia, with or without urge incontinence.  While we do not know the exact etiology of OAB, several treatment options exist. We discussed management including behavioral therapy (decreasing bladder irritants, urge suppression strategies, timed voids, bladder retraining), physical therapy, medication; for refractory cases posterior tibial nerve stimulation,  sacral neuromodulation, and intravesical botulinum toxin injection.  For anticholinergic medications, we discussed the potential side effects of anticholinergics including dry eyes, dry mouth, constipation, cognitive impairment and urinary retention. For Beta-3 agonist medication, we discussed the potential side effect of elevated blood pressure which is more likely to occur in  individuals with uncontrolled hypertension. - reviewed fluid management - encouraged weight reduction  - trial of mirabegron 25mg , can increase to 50mg  if BP WNL in 4 weeks - For treatment of stress urinary incontinence,  non-surgical options include expectant management, weight loss, physical therapy, as well as a pessary.  Surgical options include a midurethral sling, Burch urethropexy, and transurethral injection of a bulking agent. - start low dose vaginal estrogen - encouraged to return to pelvic floor PT - encouraged to continue treatment for back pain  Orders: -     POCT URINALYSIS DIP (CLINITEK) -     Estradiol; Place 0.5g nightly for two weeks then twice a week after  Dispense: 30 g; Refill: 3 -     AMB referral to rehabilitation  Vaginal atrophy Assessment & Plan: - For symptomatic vaginal atrophy options include lubrication with a water -based lubricant, personal hygiene measures and barrier protection against wetness, and estrogen replacement in the form of vaginal cream, vaginal tablets, or a time-released vaginal ring.   - Rx low dose vaginal estrogen  Orders: -     Estradiol; Place 0.5g nightly for two weeks then twice a week after  Dispense: 30 g; Refill: 3  Other orders -     Mirabegron ER; Take 1 tablet (25 mg total) by mouth daily. Please check your blood pressure while on this medication  Dispense: 30 tablet; Refill: 0  Time spent: I spent 69 minutes dedicated to the care of this patient on the date of this encounter to include pre-visit review of records, face-to-face time with the patient  discussing mixed urinary incontinence, fecal incontinence, nocturia, vaginal atrophy, BMI, and post visit documentation and ordering medication/ testing.   Lianne ONEIDA Gillis, MD          [1]  Social History Tobacco Use   Smoking status: Never    Passive exposure: Never   Smokeless tobacco: Never  Vaping Use   Vaping status: Never Used  Substance Use Topics   Alcohol  use: Yes    Alcohol /week: 1.0 standard drink of alcohol     Types: 1 Glasses of wine per week    Comment: occasional wine    Drug use: No   "

## 2024-08-08 NOTE — Assessment & Plan Note (Addendum)
-   avoid fluid intake 3 hours before bedtime - elevated feet during the day or use compression socks to reduce lower extremity swelling - switch diuretic (e.g. HCTZ) dosing to 2pm - due to snoring, continue biPAP for sleep apnea - trial of Mirabegron 25mg  at bedtime

## 2024-08-08 NOTE — Assessment & Plan Note (Addendum)
-   T2DM with HbA1C at goal, low back pain managed by Dr. Bonner - POCT UA negative, catheterized for 90mL - We discussed the symptoms of overactive bladder (OAB), which include urinary urgency, urinary frequency, nocturia, with or without urge incontinence.  While we do not know the exact etiology of OAB, several treatment options exist. We discussed management including behavioral therapy (decreasing bladder irritants, urge suppression strategies, timed voids, bladder retraining), physical therapy, medication; for refractory cases posterior tibial nerve stimulation, sacral neuromodulation, and intravesical botulinum toxin injection.  For anticholinergic medications, we discussed the potential side effects of anticholinergics including dry eyes, dry mouth, constipation, cognitive impairment and urinary retention. For Beta-3 agonist medication, we discussed the potential side effect of elevated blood pressure which is more likely to occur in individuals with uncontrolled hypertension. - reviewed fluid management - encouraged weight reduction  - trial of mirabegron 25mg , can increase to 50mg  if BP WNL in 4 weeks - For treatment of stress urinary incontinence,  non-surgical options include expectant management, weight loss, physical therapy, as well as a pessary.  Surgical options include a midurethral sling, Burch urethropexy, and transurethral injection of a bulking agent. - start low dose vaginal estrogen - encouraged to return to pelvic floor PT - encouraged to continue treatment for back pain

## 2024-08-08 NOTE — Assessment & Plan Note (Signed)
-   For symptomatic vaginal atrophy options include lubrication with a water-based lubricant, personal hygiene measures and barrier protection against wetness, and estrogen replacement in the form of vaginal cream, vaginal tablets, or a time-released vaginal ring.   - Rx low dose vaginal estrogen

## 2024-08-15 ENCOUNTER — Encounter: Payer: Self-pay | Admitting: Internal Medicine

## 2024-08-16 MED ORDER — MUPIROCIN 2 % EX OINT: 1.0000 | TOPICAL_OINTMENT | Freq: Two times a day (BID) | CUTANEOUS | 2 refills | Status: AC

## 2024-08-16 MED ORDER — INSULIN GLARGINE 100 UNIT/ML ~~LOC~~ SOLN: 20.0000 [IU] | Freq: Two times a day (BID) | SUBCUTANEOUS | 3 refills | Status: DC

## 2024-08-18 ENCOUNTER — Ambulatory Visit (INDEPENDENT_AMBULATORY_CARE_PROVIDER_SITE_OTHER): Admitting: Pulmonary Disease

## 2024-08-18 ENCOUNTER — Encounter (HOSPITAL_BASED_OUTPATIENT_CLINIC_OR_DEPARTMENT_OTHER): Payer: Self-pay | Admitting: Pulmonary Disease

## 2024-08-18 ENCOUNTER — Ambulatory Visit (HOSPITAL_BASED_OUTPATIENT_CLINIC_OR_DEPARTMENT_OTHER): Admitting: Pulmonary Disease

## 2024-08-18 VITALS — BP 144/74 | HR 77 | Ht 63.0 in | Wt 195.0 lb

## 2024-08-18 DIAGNOSIS — G4733 Obstructive sleep apnea (adult) (pediatric): Secondary | ICD-10-CM | POA: Diagnosis not present

## 2024-08-18 NOTE — Progress Notes (Signed)
" ° °  Subjective:    Patient ID: [name] [name], female    DOB: November 14, 1945, 79 y.o.   MRN: [id number]  79 yo woman for FU of obstructive sleep apnea  Previous pt of dr Burnard New biPAP 2024  PMH : DM-2 hypertension,   psoriatic arthritis, on Humira & methotrexate   DVT, which occurred while traveling to [place]  Charcot joint - right foot.  long COVID resulting from her COVID infection in November 2020         Discussed the use of AI scribe software for clinical note transcription with the patient, who gave verbal consent to proceed.  History of Present Illness [name] [name] is a 79 year old female with obstructive sleep apnea who presents for a four-month follow-up.  She uses auto BiPAP with maximum pressure 22, minimum 14, and pressure support 5. She describes discomfort with inhalation pressure and occasional sensation of air trapping in the mask.  Despite current settings she has about 29 apnea events per hour. A sleep study in 2024 reduced events to 6 per hour at the sleep center. She does not use sleep medications and takes a muscle relaxant nightly at bedtime.  She has significant chronic pain that interrupts sleep and drains her energy. She recently saw a new pain specialist to pursue non-invasive pain treatments.     Significant tests/ events reviewed   BiPAP titration  10/2022. BiPAP was initiated 8/4 and titrated to maximum optimal pressure 22/18. AHI was 0, RDI 6.6 and O2 nadir at 93%. She did not achieve any REM sleep   Review of Systems  neg for any significant sore throat, dysphagia, itching, sneezing, nasal congestion or excess/ purulent secretions, fever, chills, sweats, unintended wt loss, pleuritic or exertional cp, hempoptysis, orthopnea pnd or change in chronic leg swelling. Also denies presyncope, palpitations, heartburn, abdominal pain, nausea, vomiting, diarrhea or change in bowel or urinary habits, dysuria,hematuria, rash, arthralgias, visual complaints,  headache, numbness weakness or ataxia.      Objective:   Physical Exam  Gen. Pleasant, obese, in no distress ENT - no lesions, no post nasal drip Neck: No JVD, no thyromegaly, no carotid bruits Lungs: no use of accessory muscles, no dullness to percussion, decreased without rales or rhonchi  Cardiovascular: Rhythm regular, heart sounds  normal, no murmurs or gallops, no peripheral edema Musculoskeletal: No deformities, no cyanosis or clubbing , no tremors   BiPAP DL reviewed Reviewed al previous DLs & titration raw data      Assessment & Plan:   Assessment and Plan Assessment & Plan Obstructive sleep apnea Managed with auto BiPAP. Current settings are maximum IPAP of 22 cm H2O and minimum IPAP of 14 cm H2O with a base support of 5 cm H2O. Reports sensation of pressure during inhalation but not during exhalation, which is intentional with BiPAP settings. Experiencing 29 events per hour, higher than expected given current pressure settings. Previous study in 2024 showed 6 events per hour with similar pressures, suggesting discrepancy in current data. No changes in medications or sleep medications reported. Goal is to reduce events to less than 15 per hour to lower cardiac risk. - Increased maximum IPAP to 25 cm H2O. - Ordered [employer] to adjust BiPAP settings remotely. - Will review BiPAP report in one month to assess effectiveness of pressure adjustment. - If events remain high, will consider home blood pressure filtration study.   Chronic pain - on opiates & flexeril  Sees pain management   "

## 2024-08-18 NOTE — Patient Instructions (Signed)
 X Increase IPAP max to 25 cm  DL fro [employer] in 1 month  If events are not decreased we will proceed with repeat BiPAP titration study

## 2024-08-19 MED ORDER — DULOXETINE HCL 20 MG PO CPEP: 20.0000 mg | ORAL_CAPSULE | Freq: Every day | ORAL | 3 refills | Status: DC

## 2024-08-19 NOTE — Addendum Note (Signed)
 Addended by: GEOFM GLADE PARAS on: 08/19/2024 01:17 PM   Modules accepted: Orders

## 2024-08-21 ENCOUNTER — Encounter: Payer: Self-pay | Admitting: *Deleted

## 2024-08-24 ENCOUNTER — Ambulatory Visit (HOSPITAL_BASED_OUTPATIENT_CLINIC_OR_DEPARTMENT_OTHER): Admitting: Pulmonary Disease

## 2024-09-08 MED ORDER — INSULIN GLARGINE 100 UNIT/ML ~~LOC~~ SOLN: 20.0000 [IU] | Freq: Two times a day (BID) | SUBCUTANEOUS | 1 refills | Status: DC

## 2024-09-08 NOTE — Addendum Note (Signed)
 Addended by: GEOFM GLADE PARAS on: 09/08/2024 08:09 PM   Modules accepted: Orders

## 2024-09-12 ENCOUNTER — Other Ambulatory Visit: Payer: Self-pay | Admitting: Internal Medicine

## 2024-09-12 ENCOUNTER — Encounter: Payer: Self-pay | Admitting: Internal Medicine

## 2024-09-13 ENCOUNTER — Ambulatory Visit: Admitting: Internal Medicine

## 2024-09-13 ENCOUNTER — Telehealth: Payer: Self-pay | Admitting: Internal Medicine

## 2024-09-13 NOTE — Telephone Encounter (Signed)
 Patient states that she was supposed to receive [employer] from the pharmacy but instead received a bottle of insulin  with no syringes. Requests Call back and clarification from providers.  Best call back [phone]

## 2024-09-14 ENCOUNTER — Other Ambulatory Visit: Payer: Self-pay

## 2024-09-14 MED ORDER — INSULIN GLARGINE 100 UNIT/ML ~~LOC~~ SOLN: 20.0000 [IU] | Freq: Two times a day (BID) | SUBCUTANEOUS | 1 refills | Status: DC

## 2024-09-14 NOTE — Telephone Encounter (Signed)
 Message was addressed with patient via my-chart.  Script was sent back to local pharmacy instead of mail order.

## 2024-09-15 MED ORDER — LANTUS SOLOSTAR 100 UNIT/ML ~~LOC~~ SOPN: 20.0000 [IU] | PEN_INJECTOR | Freq: Every day | SUBCUTANEOUS | 2 refills | Status: AC

## 2024-09-15 NOTE — Addendum Note (Signed)
 Addended by: GEOFM GLADE PARAS on: 09/15/2024 04:47 PM   Modules accepted: Orders

## 2024-09-21 ENCOUNTER — Ambulatory Visit: Admitting: Internal Medicine

## 2024-09-29 ENCOUNTER — Ambulatory Visit: Admitting: Podiatry

## 2024-10-12 ENCOUNTER — Ambulatory Visit: Admitting: Physical Therapy

## 2024-10-27 ENCOUNTER — Ambulatory Visit

## 2024-11-07 ENCOUNTER — Ambulatory Visit: Admitting: Obstetrics

## 2025-02-05 ENCOUNTER — Ambulatory Visit (HOSPITAL_BASED_OUTPATIENT_CLINIC_OR_DEPARTMENT_OTHER): Admitting: Pulmonary Disease
